# Patient Record
Sex: Female | Born: 1990 | Race: Black or African American | Hispanic: No | Marital: Single | Smoking: Former smoker
Health system: Southern US, Community
[De-identification: ages and names within clinical notes are randomized; demographics above are authoritative.]

## PROBLEM LIST (undated history)

## (undated) ENCOUNTER — Emergency Department (HOSPITAL_BASED_OUTPATIENT_CLINIC_OR_DEPARTMENT_OTHER): Discharge status: Absent without leave | Payer: Medicare Other

## (undated) DIAGNOSIS — Z7901 Long term (current) use of anticoagulants: Secondary | ICD-10-CM

## (undated) DIAGNOSIS — I82409 Acute embolism and thrombosis of unspecified deep veins of unspecified lower extremity: Secondary | ICD-10-CM

## (undated) DIAGNOSIS — F419 Anxiety disorder, unspecified: Secondary | ICD-10-CM

## (undated) DIAGNOSIS — D571 Sickle-cell disease without crisis: Secondary | ICD-10-CM

## (undated) DIAGNOSIS — D649 Anemia, unspecified: Secondary | ICD-10-CM

## (undated) DIAGNOSIS — F1110 Opioid abuse, uncomplicated: Secondary | ICD-10-CM

## (undated) DIAGNOSIS — I2699 Other pulmonary embolism without acute cor pulmonale: Secondary | ICD-10-CM

## (undated) DIAGNOSIS — I10 Essential (primary) hypertension: Secondary | ICD-10-CM

## (undated) DIAGNOSIS — S329XXA Fracture of unspecified parts of lumbosacral spine and pelvis, initial encounter for closed fracture: Secondary | ICD-10-CM

## (undated) DIAGNOSIS — L03211 Cellulitis of face: Secondary | ICD-10-CM

## (undated) DIAGNOSIS — Z95810 Presence of automatic (implantable) cardiac defibrillator: Secondary | ICD-10-CM

## (undated) DIAGNOSIS — F329 Major depressive disorder, single episode, unspecified: Secondary | ICD-10-CM

## (undated) DIAGNOSIS — N186 End stage renal disease: Secondary | ICD-10-CM

## (undated) DIAGNOSIS — K219 Gastro-esophageal reflux disease without esophagitis: Secondary | ICD-10-CM

## (undated) DIAGNOSIS — I499 Cardiac arrhythmia, unspecified: Secondary | ICD-10-CM

## (undated) DIAGNOSIS — M810 Age-related osteoporosis without current pathological fracture: Secondary | ICD-10-CM

## (undated) DIAGNOSIS — L0201 Cutaneous abscess of face: Secondary | ICD-10-CM

## (undated) DIAGNOSIS — I509 Heart failure, unspecified: Secondary | ICD-10-CM

## (undated) DIAGNOSIS — S22009A Unspecified fracture of unspecified thoracic vertebra, initial encounter for closed fracture: Secondary | ICD-10-CM

## (undated) DIAGNOSIS — F32A Depression, unspecified: Secondary | ICD-10-CM

## (undated) DIAGNOSIS — R011 Cardiac murmur, unspecified: Secondary | ICD-10-CM

## (undated) DIAGNOSIS — Z9289 Personal history of other medical treatment: Secondary | ICD-10-CM

## (undated) DIAGNOSIS — Z992 Dependence on renal dialysis: Secondary | ICD-10-CM

## (undated) DIAGNOSIS — G8929 Other chronic pain: Secondary | ICD-10-CM

## (undated) HISTORY — PX: AV FISTULA PLACEMENT: SHX1204

## (undated) HISTORY — PX: CARDIAC CATHETERIZATION: SHX172

## (undated) HISTORY — PX: NEPHRECTOMY: SHX65

## (undated) HISTORY — PX: TONSILLECTOMY AND ADENOIDECTOMY: SUR1326

---
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
## 2010-10-31 ENCOUNTER — Emergency Department (HOSPITAL_BASED_OUTPATIENT_CLINIC_OR_DEPARTMENT_OTHER)
Admission: EM | Admit: 2010-10-31 | Discharge: 2010-10-31 | Payer: Self-pay | Source: Home / Self Care | Admitting: Emergency Medicine

## 2010-11-07 LAB — DIFFERENTIAL
Basophils Absolute: 0 10*3/uL (ref 0.0–0.1)
Basophils Relative: 0 % (ref 0–1)
Eosinophils Absolute: 0.1 10*3/uL (ref 0.0–0.7)
Eosinophils Relative: 2 % (ref 0–5)
Lymphocytes Relative: 34 % (ref 12–46)
Lymphs Abs: 2.4 10*3/uL (ref 0.7–4.0)
Monocytes Absolute: 0.8 10*3/uL (ref 0.1–1.0)
Monocytes Relative: 11 % (ref 3–12)
Neutro Abs: 3.7 10*3/uL (ref 1.7–7.7)
Neutrophils Relative %: 52 % (ref 43–77)

## 2010-11-07 LAB — COMPREHENSIVE METABOLIC PANEL
ALT: 4 U/L (ref 0–35)
AST: 27 U/L (ref 0–37)
Albumin: 4.7 g/dL (ref 3.5–5.2)
Alkaline Phosphatase: 66 U/L (ref 39–117)
BUN: 22 mg/dL (ref 6–23)
CO2: 19 mEq/L (ref 19–32)
Calcium: 10.2 mg/dL (ref 8.4–10.5)
Chloride: 113 mEq/L — ABNORMAL HIGH (ref 96–112)
Creatinine, Ser: 1.7 mg/dL — ABNORMAL HIGH (ref 0.4–1.2)
GFR calc Af Amer: 47 mL/min — ABNORMAL LOW (ref >60)
GFR calc non Af Amer: 39 mL/min — ABNORMAL LOW (ref >60)
Glucose, Bld: 86 mg/dL (ref 70–99)
Potassium: 4.8 mEq/L (ref 3.5–5.1)
Sodium: 146 mEq/L — ABNORMAL HIGH (ref 135–145)
Total Bilirubin: 0.5 mg/dL (ref 0.3–1.2)
Total Protein: 8.9 g/dL — ABNORMAL HIGH (ref 6.0–8.3)

## 2010-11-07 LAB — BASIC METABOLIC PANEL
BUN: 22 mg/dL (ref 6–23)
CO2: 18 mEq/L — ABNORMAL LOW (ref 19–32)
Calcium: 9 mg/dL (ref 8.4–10.5)
Chloride: 117 mEq/L — ABNORMAL HIGH (ref 96–112)
Creatinine, Ser: 1.7 mg/dL — ABNORMAL HIGH (ref 0.4–1.2)
GFR calc Af Amer: 47 mL/min — ABNORMAL LOW (ref >60)
GFR calc non Af Amer: 39 mL/min — ABNORMAL LOW (ref >60)
Glucose, Bld: 90 mg/dL (ref 70–99)
Potassium: 4.6 mEq/L (ref 3.5–5.1)
Sodium: 143 mEq/L (ref 135–145)

## 2010-11-07 LAB — URINALYSIS, ROUTINE W REFLEX MICROSCOPIC
Bilirubin Urine: NEGATIVE
Ketones, ur: NEGATIVE mg/dL
Leukocytes, UA: NEGATIVE
Nitrite: NEGATIVE
Protein, ur: 100 mg/dL — AB
Specific Gravity, Urine: 1.025 (ref 1.005–1.030)
Urine Glucose, Fasting: NEGATIVE mg/dL
Urobilinogen, UA: 0.2 mg/dL (ref 0.0–1.0)
pH: 6.5 (ref 5.0–8.0)

## 2010-11-07 LAB — CBC
HCT: 35.4 % — ABNORMAL LOW (ref 36.0–46.0)
Hemoglobin: 11.4 g/dL — ABNORMAL LOW (ref 12.0–15.0)
MCH: 25.7 pg — ABNORMAL LOW (ref 26.0–34.0)
MCHC: 32.2 g/dL (ref 30.0–36.0)
MCV: 79.7 fL (ref 78.0–100.0)
Platelets: 239 10*3/uL (ref 150–400)
RBC: 4.44 MIL/uL (ref 3.87–5.11)
RDW: 13.9 % (ref 11.5–15.5)
WBC: 7 10*3/uL (ref 4.0–10.5)

## 2010-11-07 LAB — PREGNANCY, URINE: Preg Test, Ur: NEGATIVE

## 2010-11-07 LAB — URINE MICROSCOPIC-ADD ON

## 2011-01-08 LAB — BASIC METABOLIC PANEL
BUN: 27 mg/dL — ABNORMAL HIGH (ref 6–23)
CO2: 15 mEq/L — ABNORMAL LOW (ref 19–32)
Calcium: 10.3 mg/dL (ref 8.4–10.5)
Chloride: 112 mEq/L (ref 96–112)
Creatinine, Ser: 1.9 mg/dL — ABNORMAL HIGH (ref 0.4–1.2)
GFR calc Af Amer: 42 mL/min — ABNORMAL LOW (ref >60)
GFR calc non Af Amer: 34 mL/min — ABNORMAL LOW (ref >60)
Glucose, Bld: 85 mg/dL (ref 70–99)
Potassium: 4.8 mEq/L (ref 3.5–5.1)
Sodium: 145 mEq/L (ref 135–145)

## 2011-01-08 LAB — URINE CULTURE: Colony Count: 80000

## 2011-01-08 LAB — CBC
HCT: 35.6 % — ABNORMAL LOW (ref 36.0–46.0)
Hemoglobin: 11.5 g/dL — ABNORMAL LOW (ref 12.0–15.0)
MCHC: 32.3 g/dL (ref 30.0–36.0)
MCV: 82 fL (ref 78.0–100.0)
Platelets: 256 10*3/uL (ref 150–400)
RBC: 4.34 MIL/uL (ref 3.87–5.11)
RDW: 13.5 % (ref 11.5–15.5)
WBC: 11 10*3/uL — ABNORMAL HIGH (ref 4.0–10.5)

## 2011-01-08 LAB — DIFFERENTIAL
Basophils Absolute: 0.3 10*3/uL — ABNORMAL HIGH (ref 0.0–0.1)
Basophils Relative: 3 % — ABNORMAL HIGH (ref 0–1)
Eosinophils Absolute: 0.1 10*3/uL (ref 0.0–0.7)
Eosinophils Relative: 1 % (ref 0–5)
Lymphocytes Relative: 24 % (ref 12–46)
Lymphs Abs: 2.6 10*3/uL (ref 0.7–4.0)
Monocytes Absolute: 0.8 10*3/uL (ref 0.1–1.0)
Monocytes Relative: 7 % (ref 3–12)
Neutro Abs: 7.2 10*3/uL (ref 1.7–7.7)
Neutrophils Relative %: 65 % (ref 43–77)

## 2011-01-08 LAB — URINALYSIS, ROUTINE W REFLEX MICROSCOPIC
Bilirubin Urine: NEGATIVE
Glucose, UA: NEGATIVE mg/dL
Ketones, ur: NEGATIVE mg/dL
Nitrite: POSITIVE — AB
Protein, ur: 100 mg/dL — AB
Specific Gravity, Urine: 1.019 (ref 1.005–1.030)
Urobilinogen, UA: 0.2 mg/dL (ref 0.0–1.0)
pH: 6 (ref 5.0–8.0)

## 2011-01-08 LAB — URINE MICROSCOPIC-ADD ON

## 2011-01-08 LAB — PREGNANCY, URINE: Preg Test, Ur: NEGATIVE

## 2011-01-26 LAB — CBC
HCT: 34 % — ABNORMAL LOW (ref 36.0–46.0)
Hemoglobin: 11.1 g/dL — ABNORMAL LOW (ref 12.0–15.0)
MCHC: 32.6 g/dL (ref 30.0–36.0)
MCV: 84.3 fL (ref 78.0–100.0)
Platelets: 230 10*3/uL (ref 150–400)
RBC: 4.04 MIL/uL (ref 3.87–5.11)
RDW: 13.7 % (ref 11.5–15.5)
WBC: 15.8 10*3/uL — ABNORMAL HIGH (ref 4.0–10.5)

## 2011-01-26 LAB — COMPREHENSIVE METABOLIC PANEL
ALT: 11 U/L (ref 0–35)
AST: 22 U/L (ref 0–37)
Albumin: 4.6 g/dL (ref 3.5–5.2)
Alkaline Phosphatase: 65 U/L (ref 39–117)
BUN: 15 mg/dL (ref 6–23)
CO2: 23 mEq/L (ref 19–32)
Calcium: 10.2 mg/dL (ref 8.4–10.5)
Chloride: 106 mEq/L (ref 96–112)
Creatinine, Ser: 1.7 mg/dL — ABNORMAL HIGH (ref 0.4–1.2)
GFR calc Af Amer: 47 mL/min — ABNORMAL LOW (ref >60)
GFR calc non Af Amer: 39 mL/min — ABNORMAL LOW (ref >60)
Glucose, Bld: 97 mg/dL (ref 70–99)
Potassium: 4.3 mEq/L (ref 3.5–5.1)
Sodium: 141 mEq/L (ref 135–145)
Total Bilirubin: 0.8 mg/dL (ref 0.3–1.2)
Total Protein: 8.7 g/dL — ABNORMAL HIGH (ref 6.0–8.3)

## 2011-01-26 LAB — DIFFERENTIAL
Basophils Absolute: 0.1 10*3/uL (ref 0.0–0.1)
Basophils Relative: 0 % (ref 0–1)
Eosinophils Absolute: 0 10*3/uL (ref 0.0–0.7)
Eosinophils Relative: 0 % (ref 0–5)
Lymphocytes Relative: 8 % — ABNORMAL LOW (ref 12–46)
Lymphs Abs: 1.3 10*3/uL (ref 0.7–4.0)
Monocytes Absolute: 0.7 10*3/uL (ref 0.1–1.0)
Monocytes Relative: 5 % (ref 3–12)
Neutro Abs: 13.7 10*3/uL — ABNORMAL HIGH (ref 1.7–7.7)
Neutrophils Relative %: 87 % — ABNORMAL HIGH (ref 43–77)

## 2011-01-26 LAB — URINE MICROSCOPIC-ADD ON

## 2011-01-26 LAB — URINALYSIS, ROUTINE W REFLEX MICROSCOPIC
Bilirubin Urine: NEGATIVE
Glucose, UA: NEGATIVE mg/dL
Ketones, ur: NEGATIVE mg/dL
Nitrite: NEGATIVE
Protein, ur: 30 mg/dL — AB
Specific Gravity, Urine: 1.019 (ref 1.005–1.030)
Urobilinogen, UA: 0.2 mg/dL (ref 0.0–1.0)
pH: 6 (ref 5.0–8.0)

## 2011-01-26 LAB — URINE CULTURE: Colony Count: >=100000

## 2011-01-26 LAB — WET PREP, GENITAL: Yeast Wet Prep HPF POC: NONE SEEN

## 2011-01-26 LAB — PREGNANCY, URINE: Preg Test, Ur: NEGATIVE

## 2011-01-26 LAB — CULTURE, BLOOD (ROUTINE X 2)

## 2011-04-17 ENCOUNTER — Emergency Department (INDEPENDENT_AMBULATORY_CARE_PROVIDER_SITE_OTHER): Payer: Managed Care, Other (non HMO)

## 2011-04-17 ENCOUNTER — Emergency Department (HOSPITAL_BASED_OUTPATIENT_CLINIC_OR_DEPARTMENT_OTHER)
Admission: EM | Admit: 2011-04-17 | Discharge: 2011-04-17 | Disposition: A | Discharge status: Home / Self Care | Payer: Managed Care, Other (non HMO) | Attending: Emergency Medicine | Admitting: Emergency Medicine

## 2011-04-17 DIAGNOSIS — R109 Unspecified abdominal pain: Secondary | ICD-10-CM | POA: Insufficient documentation

## 2011-04-17 DIAGNOSIS — Z940 Kidney transplant status: Secondary | ICD-10-CM

## 2011-04-17 DIAGNOSIS — M545 Low back pain, unspecified: Secondary | ICD-10-CM | POA: Insufficient documentation

## 2011-04-17 DIAGNOSIS — R599 Enlarged lymph nodes, unspecified: Secondary | ICD-10-CM

## 2011-04-17 LAB — COMPREHENSIVE METABOLIC PANEL
AST: 17 U/L (ref 0–37)
Alkaline Phosphatase: 60 U/L (ref 39–117)
BUN: 22 mg/dL (ref 6–23)
CO2: 20 mEq/L (ref 19–32)
Chloride: 108 mEq/L (ref 96–112)
Creatinine, Ser: 1.5 mg/dL — ABNORMAL HIGH (ref 0.50–1.10)
GFR calc non Af Amer: 45 mL/min — ABNORMAL LOW (ref >60)
Total Bilirubin: 0.2 mg/dL — ABNORMAL LOW (ref 0.3–1.2)

## 2011-04-17 LAB — URINE MICROSCOPIC-ADD ON

## 2011-04-17 LAB — DIFFERENTIAL
Lymphocytes Relative: 30 % (ref 12–46)
Lymphs Abs: 3.5 10*3/uL (ref 0.7–4.0)
Neutrophils Relative %: 62 % (ref 43–77)

## 2011-04-17 LAB — CBC
HCT: 33.6 % — ABNORMAL LOW (ref 36.0–46.0)
MCV: 81.8 fL (ref 78.0–100.0)
Platelets: 273 10*3/uL (ref 150–400)
RBC: 4.11 MIL/uL (ref 3.87–5.11)
WBC: 11.4 10*3/uL — ABNORMAL HIGH (ref 4.0–10.5)

## 2011-04-17 LAB — URINALYSIS, ROUTINE W REFLEX MICROSCOPIC
Bilirubin Urine: NEGATIVE
Glucose, UA: NEGATIVE mg/dL
Ketones, ur: NEGATIVE mg/dL
pH: 6.5 (ref 5.0–8.0)

## 2011-09-28 ENCOUNTER — Emergency Department (HOSPITAL_COMMUNITY)
Admission: EM | Admit: 2011-09-28 | Discharge: 2011-09-28 | Discharge status: Against Medical Advice | Payer: Managed Care, Other (non HMO) | Attending: Emergency Medicine | Admitting: Emergency Medicine

## 2011-09-28 ENCOUNTER — Encounter: Payer: Self-pay | Admitting: Emergency Medicine

## 2011-09-28 DIAGNOSIS — R509 Fever, unspecified: Secondary | ICD-10-CM | POA: Insufficient documentation

## 2011-09-28 NOTE — ED Notes (Signed)
PT. REPORTS FEVER ONSET TODAY WHILE ON HEMODIALYSIS ,  ALSO REPORTS 'KNOT " AT RIGHT LATERAL NECK .

## 2012-01-09 ENCOUNTER — Other Ambulatory Visit (HOSPITAL_COMMUNITY): Payer: Self-pay | Admitting: Nephrology

## 2012-01-09 DIAGNOSIS — N186 End stage renal disease: Secondary | ICD-10-CM

## 2012-01-12 ENCOUNTER — Ambulatory Visit (HOSPITAL_COMMUNITY): Payer: Managed Care, Other (non HMO)

## 2012-01-22 DIAGNOSIS — I2699 Other pulmonary embolism without acute cor pulmonale: Secondary | ICD-10-CM

## 2012-01-22 HISTORY — DX: Other pulmonary embolism without acute cor pulmonale: I26.99

## 2012-01-31 ENCOUNTER — Other Ambulatory Visit (HOSPITAL_COMMUNITY): Payer: Self-pay | Admitting: Nephrology

## 2012-01-31 DIAGNOSIS — N186 End stage renal disease: Secondary | ICD-10-CM

## 2012-02-01 ENCOUNTER — Ambulatory Visit (HOSPITAL_COMMUNITY): Admission: RE | Admit: 2012-02-01 | Payer: Managed Care, Other (non HMO) | Source: Ambulatory Visit

## 2012-02-02 ENCOUNTER — Encounter (HOSPITAL_COMMUNITY): Payer: Self-pay | Admitting: *Deleted

## 2012-02-02 ENCOUNTER — Inpatient Hospital Stay (HOSPITAL_COMMUNITY)
Admission: EM | Admit: 2012-02-02 | Discharge: 2012-02-14 | DRG: 175 | Disposition: A | Discharge status: Home / Self Care | Payer: Managed Care, Other (non HMO) | Attending: Internal Medicine | Admitting: Internal Medicine

## 2012-02-02 ENCOUNTER — Ambulatory Visit (HOSPITAL_COMMUNITY)
Admission: RE | Admit: 2012-02-02 | Discharge: 2012-02-02 | Disposition: A | Discharge status: Home / Self Care | Payer: Managed Care, Other (non HMO) | Source: Ambulatory Visit | Attending: Nephrology | Admitting: Nephrology

## 2012-02-02 ENCOUNTER — Emergency Department (HOSPITAL_COMMUNITY): Payer: Managed Care, Other (non HMO)

## 2012-02-02 ENCOUNTER — Other Ambulatory Visit: Payer: Self-pay

## 2012-02-02 DIAGNOSIS — Z940 Kidney transplant status: Secondary | ICD-10-CM

## 2012-02-02 DIAGNOSIS — D696 Thrombocytopenia, unspecified: Secondary | ICD-10-CM | POA: Diagnosis present

## 2012-02-02 DIAGNOSIS — Y841 Kidney dialysis as the cause of abnormal reaction of the patient, or of later complication, without mention of misadventure at the time of the procedure: Secondary | ICD-10-CM | POA: Diagnosis present

## 2012-02-02 DIAGNOSIS — I10 Essential (primary) hypertension: Secondary | ICD-10-CM

## 2012-02-02 DIAGNOSIS — T82898A Other specified complication of vascular prosthetic devices, implants and grafts, initial encounter: Secondary | ICD-10-CM | POA: Diagnosis present

## 2012-02-02 DIAGNOSIS — R109 Unspecified abdominal pain: Secondary | ICD-10-CM

## 2012-02-02 DIAGNOSIS — K5900 Constipation, unspecified: Secondary | ICD-10-CM | POA: Diagnosis not present

## 2012-02-02 DIAGNOSIS — N186 End stage renal disease: Secondary | ICD-10-CM

## 2012-02-02 DIAGNOSIS — Z9119 Patient's noncompliance with other medical treatment and regimen: Secondary | ICD-10-CM

## 2012-02-02 DIAGNOSIS — E079 Disorder of thyroid, unspecified: Secondary | ICD-10-CM | POA: Diagnosis present

## 2012-02-02 DIAGNOSIS — Q613 Polycystic kidney, unspecified: Secondary | ICD-10-CM

## 2012-02-02 DIAGNOSIS — I428 Other cardiomyopathies: Secondary | ICD-10-CM | POA: Diagnosis present

## 2012-02-02 DIAGNOSIS — I120 Hypertensive chronic kidney disease with stage 5 chronic kidney disease or end stage renal disease: Secondary | ICD-10-CM | POA: Diagnosis present

## 2012-02-02 DIAGNOSIS — R197 Diarrhea, unspecified: Secondary | ICD-10-CM | POA: Diagnosis present

## 2012-02-02 DIAGNOSIS — N2581 Secondary hyperparathyroidism of renal origin: Secondary | ICD-10-CM | POA: Diagnosis present

## 2012-02-02 DIAGNOSIS — D649 Anemia, unspecified: Secondary | ICD-10-CM | POA: Diagnosis present

## 2012-02-02 DIAGNOSIS — Z905 Acquired absence of kidney: Secondary | ICD-10-CM

## 2012-02-02 DIAGNOSIS — Z79899 Other long term (current) drug therapy: Secondary | ICD-10-CM

## 2012-02-02 DIAGNOSIS — Z87891 Personal history of nicotine dependence: Secondary | ICD-10-CM

## 2012-02-02 DIAGNOSIS — Z7901 Long term (current) use of anticoagulants: Secondary | ICD-10-CM

## 2012-02-02 DIAGNOSIS — Z888 Allergy status to other drugs, medicaments and biological substances status: Secondary | ICD-10-CM

## 2012-02-02 DIAGNOSIS — Z992 Dependence on renal dialysis: Secondary | ICD-10-CM | POA: Diagnosis present

## 2012-02-02 DIAGNOSIS — I2699 Other pulmonary embolism without acute cor pulmonale: Principal | ICD-10-CM | POA: Diagnosis present

## 2012-02-02 DIAGNOSIS — J189 Pneumonia, unspecified organism: Secondary | ICD-10-CM | POA: Diagnosis present

## 2012-02-02 DIAGNOSIS — Z91199 Patient's noncompliance with other medical treatment and regimen due to unspecified reason: Secondary | ICD-10-CM

## 2012-02-02 DIAGNOSIS — R042 Hemoptysis: Secondary | ICD-10-CM | POA: Diagnosis present

## 2012-02-02 HISTORY — DX: Essential (primary) hypertension: I10

## 2012-02-02 LAB — CBC
MCHC: 29.6 g/dL — ABNORMAL LOW (ref 30.0–36.0)
MCV: 92.7 fL (ref 78.0–100.0)
Platelets: 204 10*3/uL (ref 150–400)
RDW: 21 % — ABNORMAL HIGH (ref 11.5–15.5)
WBC: 7.4 10*3/uL (ref 4.0–10.5)

## 2012-02-02 LAB — DIFFERENTIAL
Basophils Absolute: 0 10*3/uL (ref 0.0–0.1)
Basophils Relative: 0 % (ref 0–1)
Eosinophils Absolute: 0 10*3/uL (ref 0.0–0.7)
Eosinophils Relative: 0 % (ref 0–5)
Lymphocytes Relative: 31 % (ref 12–46)

## 2012-02-02 LAB — BASIC METABOLIC PANEL
Calcium: 10.5 mg/dL (ref 8.4–10.5)
Creatinine, Ser: 6.65 mg/dL — ABNORMAL HIGH (ref 0.50–1.10)
GFR calc Af Amer: 9 mL/min — ABNORMAL LOW (ref >90)
GFR calc non Af Amer: 8 mL/min — ABNORMAL LOW (ref >90)
Sodium: 139 mEq/L (ref 135–145)

## 2012-02-02 MED ORDER — XENON XE 133 GAS
20.0000 | GAS_FOR_INHALATION | Freq: Once | RESPIRATORY_TRACT | Status: AC | PRN
  Administered 2012-02-02: 20 via RESPIRATORY_TRACT

## 2012-02-02 MED ORDER — LABETALOL HCL 5 MG/ML IV SOLN
20.0000 mg | Freq: Once | INTRAVENOUS | Status: AC
  Administered 2012-02-02: 20 mg via INTRAVENOUS
  Filled 2012-02-02: qty 4

## 2012-02-02 MED ORDER — ALTEPLASE 100 MG IV SOLR
2.0000 mg | Freq: Once | INTRAVENOUS | Status: DC
  Filled 2012-02-02: qty 2

## 2012-02-02 MED ORDER — FENTANYL CITRATE 0.05 MG/ML IJ SOLN
100.0000 ug | Freq: Once | INTRAMUSCULAR | Status: AC
  Administered 2012-02-03: 100 ug via INTRAVENOUS
  Filled 2012-02-02: qty 2

## 2012-02-02 MED ORDER — HYDROMORPHONE HCL PF 1 MG/ML IJ SOLN
1.0000 mg | Freq: Once | INTRAMUSCULAR | Status: AC
  Administered 2012-02-02: 1 mg via INTRAVENOUS
  Filled 2012-02-02: qty 1

## 2012-02-02 MED ORDER — DIPHENHYDRAMINE HCL 50 MG/ML IJ SOLN
25.0000 mg | Freq: Once | INTRAMUSCULAR | Status: AC
  Administered 2012-02-02: 25 mg via INTRAVENOUS
  Filled 2012-02-02: qty 1

## 2012-02-02 MED ORDER — ONDANSETRON HCL 4 MG/2ML IJ SOLN
4.0000 mg | Freq: Once | INTRAMUSCULAR | Status: AC
  Administered 2012-02-02: 4 mg via INTRAVENOUS
  Filled 2012-02-02: qty 2

## 2012-02-02 MED ORDER — TECHNETIUM TO 99M ALBUMIN AGGREGATED
3.0000 | Freq: Once | INTRAVENOUS | Status: AC | PRN
  Administered 2012-02-02: 3 via INTRAVENOUS

## 2012-02-02 NOTE — ED Notes (Signed)
Was seen and examined by Dr. Effie Shy

## 2012-02-02 NOTE — ED Notes (Signed)
Alert, NAD, calm, interactive, skin W&D, resps e/u, speaking in clear compelte sentences, "sleepy",  mentions itching remains (from pain med), requesting benadryl. Updated, pending dispositon and/or POC. Unable to do CTA at this time, EDP, pt & CT aware.

## 2012-02-02 NOTE — ED Notes (Signed)
VQ tech called from home, will be ready in ~ 1hr.

## 2012-02-02 NOTE — Progress Notes (Signed)
Patient ID: [name] Small, female   DOB: [birth year], 20 y.o.   MRN: [id number] The patient was evaluated at the Interventional Radiology nursing station for possible declot of a right upper arm fistula.  Ultrasound demonstrated complete occlusion of the right cephalic vein from the elbow to the right shoulder.  According to the renal PA,  the fistula has never worked well.  The fistula is not a candidate for a declot procedure due to the length of the occlusion and poor function of the fistula when it was patent.

## 2012-02-02 NOTE — ED Notes (Signed)
IV team finished at [facility], unable to obtain a suitable IV for CTA, no success with US guided attempt. EDP aware.

## 2012-02-02 NOTE — ED Provider Notes (Signed)
History     CSN: [id number]  Arrival date & time 02/02/12  1312   First MD Initiated Contact with Patient 02/02/12 1604      Chief Complaint  Patient presents with  . Chest Pain    (Consider location/radiation/quality/duration/timing/severity/associated sxs/prior treatment) HPI Comments: [name] Small is a 21 y.o. Female who has had hemoptysis since early morning. Today. She is not producing sputum. She had nausea and vomiting. Last night, but has not vomited today. She has not eaten today. She has no chest pain, weakness, dizziness, or back pain. She last took dialysis yesterday. She takes dialysis regularly. She has never had bronchitis, asthma or COPD. She took her usual. Blood pressure medicine today until arriving here.  Patient is a 21 y.o. female presenting with chest pain. The history is provided by the patient.  Chest Pain     Past Medical History  Diagnosis Date  . Renal failure   . Hemodialysis patient   . PCOS (polycystic ovarian syndrome)     Past Surgical History  Procedure Date  . Nephrectomy   . Av fistula placement     History reviewed. No pertinent family history.  History  Substance Use Topics  . Smoking status: Never Smoker   . Smokeless tobacco: Not on file  . Alcohol Use: No    OB History    Grav Para Term Preterm Abortions TAB SAB Ect Mult Living                  Review of Systems  Cardiovascular: Positive for chest pain.  All other systems reviewed and are negative.    Allergies  Morphine and related  Home Medications   Current Outpatient Rx  Name Route Sig Dispense Refill  . CALCIUM ACETATE 667 MG PO CAPS Oral Take 667 mg by mouth 3 (three) times daily with meals.      . ENALAPRIL MALEATE 10 MG PO TABS Oral Take 10 mg by mouth daily.    Marland Kitchen HYDRALAZINE HCL 25 MG PO TABS Oral Take 25 mg by mouth 2 (two) times daily with a meal.    . ISOSORBIDE DINITRATE 20 MG PO TABS Oral Take 20 mg by mouth 3 (three) times daily.    .  OXYCODONE-ACETAMINOPHEN 5-325 MG PO TABS Oral Take 1-2 tablets by mouth every 4 (four) hours as needed. For pain     . PROMETHAZINE HCL 25 MG PO TABS Oral Take 25 mg by mouth every 6 (six) hours as needed.      BP 141/116  Pulse 85  Temp(Src) 98.3 F (36.8 C) (Oral)  Resp 16  Ht 5\' 3"  (1.6 m)  Wt 168 lb 6.9 oz (76.4 kg)  BMI 29.84 kg/m2  SpO2 100%  Physical Exam  Nursing note and vitals reviewed. Constitutional: She is oriented to person, place, and time. She appears well-developed and well-nourished.  HENT:  Head: Normocephalic and atraumatic.  Eyes: Conjunctivae and EOM are normal. Pupils are equal, round, and reactive to light.  Neck: Normal range of motion and phonation normal. Neck supple.  Cardiovascular: Normal rate, regular rhythm and intact distal pulses.   Pulmonary/Chest: Effort normal and breath sounds normal. No respiratory distress. She has no wheezes. She has no rales. She exhibits no tenderness.       Dialysis catheter right anterior chest wall, site appears normal.  Abdominal: Soft. She exhibits no distension. There is no tenderness. There is no guarding.  Musculoskeletal: Normal range of motion.  Neurological: She is alert  and oriented to person, place, and time. She has normal strength. She exhibits normal muscle tone.  Skin: Skin is warm and dry.  Psychiatric: She has a normal mood and affect. Her behavior is normal. Judgment and thought content normal.    ED Course  Procedures (including critical care time)  Attempts to cannulate a large extremity vein were unsuccessful. Contacted the radiologist on call, who was able to call the nuclear medicine team in, and to do a VQ scan  23:45- call from the radiologist, who has diagnosed left-sided PE in at least 2 segments. Heparin, ordered per pharmacy consult. We'll arrange admission.  Additional emergency treatment, labetalol for hypertension. Blood pressure improved to 141/116 at 0003 hours. Oxygen saturation  remained normal at 100% with nasal cannula supplementation 2 L per minute  CRITICAL CARE Performed by: Mancel Bale L   Total critical care time: 45  Critical care time was exclusive of separately billable procedures and treating other patients.  Critical care was necessary to treat or prevent imminent or life-threatening deterioration.  Critical care was time spent personally by me on the following activities: development of treatment plan with patient and/or surrogate as well as nursing, discussions with consultants, evaluation of patient's response to treatment, examination of patient, obtaining history from patient or surrogate, ordering and performing treatments and interventions, ordering and review of laboratory studies, ordering and review of radiographic studies, pulse oximetry and re-evaluation of patient's condition.   Labs Reviewed  CBC - Abnormal; Notable for the following:    MCHC 29.6 (*)    RDW 21.0 (*)    All other components within normal limits  BASIC METABOLIC PANEL - Abnormal; Notable for the following:    Creatinine, Ser 6.65 (*)    GFR calc non Af Amer 8 (*)    GFR calc Af Amer 9 (*)    All other components within normal limits  DIFFERENTIAL  PROTIME-INR  APTT  HEPARIN LEVEL (UNFRACTIONATED)  CBC  CARDIAC PANEL(CRET KIN+CKTOT+MB+TROPI)   Nm Pulmonary Per & Vent  02/02/2012  *RADIOLOGY REPORT*  Clinical Data: Shortness of breath, cough, hemoptysis  NM PULMONARY VENTILATION AND PERFUSION SCAN  Radiopharmaceutical: CURIE xenon xe 133 gas 20 milli Curie XENON XE 133 GAS, CURIE MAA TECHNETIUM TO 56M ALBUMIN AGGREGATED  Comparison: Correlation with chest radiograph dated 02/02/2012  Findings: Ventilation images demonstrate decreased ventilation to the left lower lobe.  Perfusion images demonstrate a segmental filling defect to the anterior portion of the left lower lobe.  Additional possible subsegmental filling defect to the anterior right lower  lobe.  Chest radiograph demonstrates mild cardiomegaly but no corresponding pulmonary opacities.  This appearance corresponds to a high probability for pulmonary embolism.  IMPRESSION: High probability for pulmonary embolism.  Critical Value/emergent results were called by telephone at the time of interpretation on 02/02/2012  at 2345 hours  to  Dr. Effie Shy, who verbally acknowledged these results.  Original Report Authenticated By: Charline Bills, M.D.   Dg Chest Port 1 View  02/02/2012  *RADIOLOGY REPORT*  Clinical Data: Coughing up blood with abdominal pain  PORTABLE CHEST - 1 VIEW  Comparison: None.  Findings: There is a dialysis catheter from right IJ approach. Cardiac silhouette is enlarged.  There is mild central venous congestion.  No effusion, infiltrate, or pneumothorax.  IMPRESSION: Cardiomegaly  with mild central venous congestion.  Original Report Authenticated By: Genevive Bi, M.D.    Date: 02/03/2012  Rate: 104  Rhythm: sinus tachycardia  QRS Axis: normal  Intervals: normal  ST/T Wave abnormalities: nonspecific ST/T changes  Conduction Disutrbances:none  Narrative Interpretation: RA enlargement  Old EKG Reviewed: none available   1. Pulmonary embolus   2. End stage renal disease   3. Hypertension       MDM  Multisegmental PE. Patient requires admission for stabilization. Blood pressure improved with treatment in the ED. Marland Kitchen No complicating factors to her with end-stage renal disease.   Plan: Admit- Admit as unassigned pt     Flint Melter, MD 02/03/12 [phone]

## 2012-02-02 NOTE — ED Notes (Addendum)
Dr. Effie Shy made aware about patient's elevated BP, see new order. ERMD was also made aware that the patient is c/o itching, denies any SOB,

## 2012-02-02 NOTE — ED Notes (Signed)
Reports onset this am of left side chest pains, last dialysis tx was yesterday. Having bilateral side pains today also. No acute distress noted at triage, ekg done.

## 2012-02-02 NOTE — ED Notes (Signed)
To VQ

## 2012-02-02 NOTE — H&P (Signed)
[name] Small is an 21 y.o. female.   Chief Complaint: clotted right arm dialysis fistula HPI: Patient with ESRD and clotted right arm hemodialysis fistula presents today for thrombolysis, possible angioplasty/stenting of fistula .  Past Medical History  Diagnosis Date  . Renal failure   . Hemodialysis patient   . PCOS (polycystic ovarian syndrome)     Past Surgical History  Procedure Date  . Nephrectomy   . Av fistula placement     No family history on file. Social History:  reports that she has never smoked. She does not have any smokeless tobacco history on file. She reports that she does not drink alcohol or use illicit drugs.  Allergies:  Allergies  Allergen Reactions  . Morphine And Related Itching    Medications Prior to Admission  Medication Sig Dispense Refill  . calcium acetate (PHOSLO) 667 MG capsule Take 667 mg by mouth 3 (three) times daily with meals.        . enalapril (VASOTEC) 10 MG tablet Take 10 mg by mouth daily.      . hydrALAZINE (APRESOLINE) 25 MG tablet Take 25 mg by mouth 2 (two) times daily with a meal.      . isosorbide dinitrate (ISORDIL) 20 MG tablet Take 20 mg by mouth 3 (three) times daily.      Marland Kitchen oxyCODONE-acetaminophen (PERCOCET) 5-325 MG per tablet Take 1-2 tablets by mouth every 4 (four) hours as needed. For pain       . promethazine (PHENERGAN) 25 MG tablet Take 25 mg by mouth every 6 (six) hours as needed.       Medications Prior to Admission  Medication Dose Route Frequency Provider Last Rate Last Dose  . alteplase (ACTIVASE) injection 2 mg  2 mg Intracatheter Once Abundio Miu, MD        No results found for this or any previous visit (from the past 48 hour(s)). No results found.  Review of Systems  Constitutional: Negative for fever and chills.  Respiratory: Positive for cough. Negative for shortness of breath.   Cardiovascular: Negative for chest pain.  Gastrointestinal: Negative for nausea, vomiting and abdominal pain.    Neurological: Negative for headaches.  Endo/Heme/Allergies: Does not bruise/bleed easily.    Blood pressure 141/117, pulse 106, temperature 97.8 F (36.6 C), temperature source Oral, resp. rate 13, SpO2 100.00%. Physical Exam  Constitutional: She is oriented to person, place, and time. She appears well-developed and well-nourished.  Cardiovascular: Regular rhythm.        Tachycardic; has right IJ perm cath; right arm fistula with no thrill/bruit  Respiratory: Effort normal and breath sounds normal.  GI: Soft. Bowel sounds are normal.  Musculoskeletal: Normal range of motion. She exhibits no edema.  Neurological: She is alert and oriented to person, place, and time.     Assessment/Plan Patient with ESRD and clotted right arm hemodialysis fistula; plan is for thrombolysis, possible angioplasty/stenting of fistula. Details/risks of above d/w pt with her understanding and consent.  Monice Lundy,D KEVIN 02/02/2012, 12:08 PM

## 2012-02-02 NOTE — ED Notes (Signed)
Pt not in room, pt in VQ

## 2012-02-03 ENCOUNTER — Inpatient Hospital Stay (HOSPITAL_COMMUNITY): Payer: Managed Care, Other (non HMO)

## 2012-02-03 ENCOUNTER — Encounter (HOSPITAL_COMMUNITY): Payer: Self-pay | Admitting: Internal Medicine

## 2012-02-03 DIAGNOSIS — Z992 Dependence on renal dialysis: Secondary | ICD-10-CM | POA: Diagnosis present

## 2012-02-03 DIAGNOSIS — N186 End stage renal disease: Secondary | ICD-10-CM | POA: Diagnosis present

## 2012-02-03 DIAGNOSIS — R109 Unspecified abdominal pain: Secondary | ICD-10-CM

## 2012-02-03 DIAGNOSIS — I2699 Other pulmonary embolism without acute cor pulmonale: Secondary | ICD-10-CM

## 2012-02-03 LAB — COMPREHENSIVE METABOLIC PANEL
ALT: 11 U/L (ref 0–35)
Calcium: 10.3 mg/dL (ref 8.4–10.5)
Creatinine, Ser: 7.9 mg/dL — ABNORMAL HIGH (ref 0.50–1.10)
GFR calc Af Amer: 8 mL/min — ABNORMAL LOW (ref >90)
Glucose, Bld: 101 mg/dL — ABNORMAL HIGH (ref 70–99)
Sodium: 138 mEq/L (ref 135–145)
Total Protein: 6.9 g/dL (ref 6.0–8.3)

## 2012-02-03 LAB — APTT: aPTT: 33 seconds (ref 24–37)

## 2012-02-03 LAB — MAGNESIUM: Magnesium: 2.1 mg/dL (ref 1.5–2.5)

## 2012-02-03 LAB — CBC
Hemoglobin: 11.5 g/dL — ABNORMAL LOW (ref 12.0–15.0)
MCH: 28 pg (ref 26.0–34.0)
MCHC: 31 g/dL (ref 30.0–36.0)

## 2012-02-03 LAB — CARDIAC PANEL(CRET KIN+CKTOT+MB+TROPI): Total CK: 52 U/L (ref 7–177)

## 2012-02-03 LAB — PROTIME-INR
INR: 1.21 (ref 0.00–1.49)
Prothrombin Time: 15.6 seconds — ABNORMAL HIGH (ref 11.6–15.2)

## 2012-02-03 LAB — MRSA PCR SCREENING: MRSA by PCR: NEGATIVE

## 2012-02-03 LAB — ANTITHROMBIN III: AntiThromb III Func: 66 % — ABNORMAL LOW (ref 75–120)

## 2012-02-03 MED ORDER — ONDANSETRON HCL 4 MG/2ML IJ SOLN
4.0000 mg | Freq: Four times a day (QID) | INTRAMUSCULAR | Status: DC | PRN
  Administered 2012-02-03 – 2012-02-12 (×4): 4 mg via INTRAVENOUS
  Filled 2012-02-03 (×4): qty 2

## 2012-02-03 MED ORDER — OXYCODONE-ACETAMINOPHEN 5-325 MG PO TABS
1.0000 | ORAL_TABLET | ORAL | Status: DC | PRN
  Administered 2012-02-03: 2 via ORAL
  Filled 2012-02-03 (×2): qty 2

## 2012-02-03 MED ORDER — ACETAMINOPHEN 325 MG PO TABS: 650.0000 mg | ORAL_TABLET | Freq: Four times a day (QID) | ORAL | Status: DC | PRN

## 2012-02-03 MED ORDER — SODIUM CHLORIDE 0.9 % IV SOLN
62.5000 mg | INTRAVENOUS | Status: DC
  Administered 2012-02-08: 62.5 mg via INTRAVENOUS
  Filled 2012-02-03: qty 5

## 2012-02-03 MED ORDER — WARFARIN VIDEO: Freq: Once | Status: DC

## 2012-02-03 MED ORDER — HYDRALAZINE HCL 25 MG PO TABS
25.0000 mg | ORAL_TABLET | Freq: Two times a day (BID) | ORAL | Status: DC
  Administered 2012-02-04 (×2): 25 mg via ORAL
  Filled 2012-02-03 (×5): qty 1

## 2012-02-03 MED ORDER — DARBEPOETIN ALFA-POLYSORBATE 100 MCG/0.5ML IJ SOLN: 100.0000 ug | INTRAMUSCULAR | Status: DC

## 2012-02-03 MED ORDER — ENALAPRIL MALEATE 10 MG PO TABS
10.0000 mg | ORAL_TABLET | Freq: Every day | ORAL | Status: DC
  Administered 2012-02-04 – 2012-02-08 (×5): 10 mg via ORAL
  Filled 2012-02-03 (×7): qty 1

## 2012-02-03 MED ORDER — SODIUM CHLORIDE 0.9 % IJ SOLN
3.0000 mL | Freq: Two times a day (BID) | INTRAMUSCULAR | Status: DC
  Administered 2012-02-05 – 2012-02-13 (×10): 3 mL via INTRAVENOUS

## 2012-02-03 MED ORDER — HYDROMORPHONE HCL PF 1 MG/ML IJ SOLN
1.0000 mg | INTRAMUSCULAR | Status: DC | PRN
  Administered 2012-02-03 – 2012-02-09 (×24): 1 mg via INTRAVENOUS
  Filled 2012-02-03 (×23): qty 1

## 2012-02-03 MED ORDER — WARFARIN SODIUM 10 MG PO TABS
10.0000 mg | ORAL_TABLET | Freq: Once | ORAL | Status: DC
  Filled 2012-02-03: qty 1

## 2012-02-03 MED ORDER — HYDROMORPHONE HCL PF 2 MG/ML IJ SOLN: 1.0000 mg | INTRAMUSCULAR | Status: DC | PRN

## 2012-02-03 MED ORDER — ISOSORBIDE DINITRATE 20 MG PO TABS
20.0000 mg | ORAL_TABLET | Freq: Three times a day (TID) | ORAL | Status: DC
  Administered 2012-02-03 – 2012-02-14 (×30): 20 mg via ORAL
  Filled 2012-02-03 (×36): qty 1

## 2012-02-03 MED ORDER — SODIUM CHLORIDE 0.9 % IV SOLN
250.0000 mL | INTRAVENOUS | Status: DC | PRN
  Administered 2012-02-08 – 2012-02-13 (×2): 250 mL via INTRAVENOUS

## 2012-02-03 MED ORDER — RENA-VITE PO TABS
1.0000 | ORAL_TABLET | Freq: Every day | ORAL | Status: DC
  Administered 2012-02-04 – 2012-02-09 (×6): 1 via ORAL
  Filled 2012-02-03 (×8): qty 1

## 2012-02-03 MED ORDER — HEPARIN (PORCINE) IN NACL 100-0.45 UNIT/ML-% IJ SOLN
1100.0000 [IU]/h | INTRAMUSCULAR | Status: DC
  Administered 2012-02-03 – 2012-02-04 (×3): 1100 [IU]/h via INTRAVENOUS
  Filled 2012-02-03 (×4): qty 250

## 2012-02-03 MED ORDER — SODIUM CHLORIDE 0.9 % IJ SOLN: 3.0000 mL | INTRAMUSCULAR | Status: DC | PRN

## 2012-02-03 MED ORDER — ALUM & MAG HYDROXIDE-SIMETH 200-200-20 MG/5ML PO SUSP
30.0000 mL | Freq: Four times a day (QID) | ORAL | Status: DC | PRN
  Filled 2012-02-03: qty 30

## 2012-02-03 MED ORDER — GUAIFENESIN-DM 100-10 MG/5ML PO SYRP
5.0000 mL | ORAL_SOLUTION | ORAL | Status: DC | PRN
  Filled 2012-02-03: qty 5

## 2012-02-03 MED ORDER — IOHEXOL 350 MG/ML SOLN
65.0000 mL | Freq: Once | INTRAVENOUS | Status: AC | PRN
  Administered 2012-02-03: 65 mL via INTRAVENOUS

## 2012-02-03 MED ORDER — ACETAMINOPHEN 650 MG RE SUPP: 650.0000 mg | Freq: Four times a day (QID) | RECTAL | Status: DC | PRN

## 2012-02-03 MED ORDER — HEPARIN BOLUS VIA INFUSION
4000.0000 [IU] | Freq: Once | INTRAVENOUS | Status: AC
  Administered 2012-02-03: 4000 [IU] via INTRAVENOUS

## 2012-02-03 MED ORDER — DIPHENHYDRAMINE HCL 50 MG/ML IJ SOLN
INTRAMUSCULAR | Status: AC
  Filled 2012-02-03: qty 1

## 2012-02-03 MED ORDER — WARFARIN - PHARMACIST DOSING INPATIENT
Freq: Every day | Status: DC
  Administered 2012-02-10 – 2012-02-13 (×4)

## 2012-02-03 MED ORDER — SODIUM CHLORIDE 0.9 % IJ SOLN: 3.0000 mL | Freq: Two times a day (BID) | INTRAMUSCULAR | Status: DC

## 2012-02-03 MED ORDER — DOCUSATE SODIUM 100 MG PO CAPS
100.0000 mg | ORAL_CAPSULE | Freq: Two times a day (BID) | ORAL | Status: DC
  Administered 2012-02-03 – 2012-02-14 (×17): 100 mg via ORAL
  Filled 2012-02-03 (×25): qty 1

## 2012-02-03 MED ORDER — ONDANSETRON HCL 4 MG PO TABS: 4.0000 mg | ORAL_TABLET | Freq: Four times a day (QID) | ORAL | Status: DC | PRN

## 2012-02-03 MED ORDER — ALBUTEROL SULFATE (5 MG/ML) 0.5% IN NEBU: 2.5000 mg | INHALATION_SOLUTION | RESPIRATORY_TRACT | Status: DC | PRN

## 2012-02-03 MED ORDER — COUMADIN BOOK
Freq: Once | Status: AC
  Administered 2012-02-03: 09:00:00
  Filled 2012-02-03: qty 1

## 2012-02-03 MED ORDER — CALCIUM ACETATE 667 MG PO CAPS
1334.0000 mg | ORAL_CAPSULE | Freq: Three times a day (TID) | ORAL | Status: DC
  Administered 2012-02-03 – 2012-02-04 (×2): 1334 mg via ORAL
  Filled 2012-02-03 (×5): qty 2

## 2012-02-03 MED ORDER — DIPHENHYDRAMINE HCL 50 MG/ML IJ SOLN
25.0000 mg | Freq: Once | INTRAMUSCULAR | Status: AC
  Administered 2012-02-03: 25 mg via INTRAVENOUS

## 2012-02-03 MED ORDER — CALCIUM ACETATE 667 MG PO CAPS
667.0000 mg | ORAL_CAPSULE | Freq: Three times a day (TID) | ORAL | Status: DC
  Administered 2012-02-03 (×2): 667 mg via ORAL
  Filled 2012-02-03 (×4): qty 1

## 2012-02-03 NOTE — ED Notes (Signed)
Wt and Ht given to pharmacy ref Heparin gtt.  Pt requesting pain med.

## 2012-02-03 NOTE — Progress Notes (Addendum)
ANTICOAGULATION CONSULT NOTE - Follow Up Consult  Pharmacy Consult for hepain/coumadin bridge day 1 Indication: probable PE  Allergies  Allergen Reactions  . Morphine And Related Itching    Patient Measurements: Height: 5\' 3"  (160 cm) Weight: 160 lb 7.9 oz (72.8 kg) IBW/kg (Calculated) : 52.4  Heparin Dosing Weight:   Vital Signs: Temp: 97.5 F (36.4 C) (04/13 0915) Temp src: Oral (04/13 0915) BP: 130/96 mmHg (04/13 0915) Pulse Rate: 98  (04/13 0915)  Labs:  Basename 02/03/12 0820 02/03/12 0600 02/03/12 0039 02/03/12 0016 02/02/12 1736  HGB 11.5* -- -- -- 12.0  HCT 37.1 -- -- -- 40.5  PLT 167 -- -- -- 204  APTT -- -- -- 33 --  LABPROT -- -- -- 15.6* --  INR -- -- -- 1.21 --  HEPARINUNFRC 0.42 -- -- -- --  CREATININE -- 7.90* -- -- 6.65*  CKTOTAL -- -- 52 -- --  CKMB -- -- 1.3 -- --  TROPONINI -- -- <0.30 -- --   Estimated Creatinine Clearance: 10.9 ml/min (by C-G formula based on Cr of 7.9).  20 yof with ESRD 2/2 polycystic renal dz admitted 02/02/2012 for CP - found with bilat PE. Presented initially for thrombolysis of clotted HD fistula though US showed complete occlusion R cephalic vein from elbow to shoulder >>not deemed appropriate for declot procedure.  Pharmacist System-Based Medication Review: Anticoag: PE on D1 heparin/coumadin bridge. HL at goal, CBC ok, INR ordered, coumadin 10mg  x1 ordered - MD dc'ed for hemoptysis. F/u CT angio to confirm PE and rule out pulm hemorrhage. Noted low antithrombin III functional assay (66%) but hard to interpret as this lab taken after heparin started in setting of acute thrombosis. ID: none. Wbc wnl. CV: resumed ace-i, hydral, imdur. vss. Endo: BMET BG 80-100 GI: DSS BID Neuro: a&o Nephr: resumed phoslo, lytes wnl, except corr Ca elevated and Ca*P >55. Pulm: RA Heme/Onc: anemic, cbc ok otherwise Best Prax: hep/coumadin Tx doses for PE  Goal of Therapy:  INR 2-3 Heparin level 0.3-0.7 units/ml   PLAN: 1. Continue  current rate of heparin 1100 units/hr. Spoke with nurse to closely monitor for worsening hemoptysis and call MD if worsens. 2. F/u AM heparin level and CBC 3. F/u CT angio 4. Will DC daily INR and reorder tmrw if MD ok to start coumadin. Tonight's entered dose held. 5. Would consider repeat antithrombin functional assay after cessation of heparin - value from this AM was drawn after heparin in setting of acute thrombosis.  Zoe Lan, PharmD pgr [phone] 02/03/2012, 11:32 AM

## 2012-02-03 NOTE — Progress Notes (Addendum)
ANTICOAGULATION CONSULT NOTE - Initial Consult  Pharmacy Consult for heparin Indication: pulmonary embolus  Allergies  Allergen Reactions  . Morphine And Related Itching    Patient Measurements: Height: 5\' 3"  (160 cm) Weight: 168 lb 6.9 oz (76.4 kg) IBW/kg (Calculated) : 52.4   Vital Signs: Temp: 98.3 F (36.8 C) (04/12 1503) Temp src: Oral (04/12 1503) BP: 141/116 mmHg (04/13 0003) Pulse Rate: 85  (04/12 2345)  Labs:  Basename 02/02/12 1736  HGB 12.0  HCT 40.5  PLT 204  APTT --  LABPROT --  INR --  HEPARINUNFRC --  CREATININE 6.65*  CKTOTAL --  CKMB --  TROPONINI --   Estimated Creatinine Clearance: 13.2 ml/min (by C-G formula based on Cr of 6.65).  Medical History: Past Medical History  Diagnosis Date  . Renal failure   . Hemodialysis patient   . PCOS (polycystic ovarian syndrome)     Assessment: 21yo female with ESRD presented initially for thrombolysis of clotted HD fistula though US showed complete occulusion of right cephalic vein from elbow to shoulder and therefore not appropriate for declot procedure; c/o CP and now found with high probability of PE on VQ, to begin heparin.  Goal of Therapy:  Heparin level 0.3-0.7 units/ml   Plan:  Will give heparin 4000 units IV bolus x1 followed by gtt at 1100 units/hr and monitor heparin levels and CBC and f/u plans for long-term anticoagulation.  Colleen Can PharmD BCPS 02/03/2012,12:19 AM   Addendum: Coumadin ordered to start today for PE; will give 10mg  po x1 and adjust per INR and begin Coumadin education.  VB 02/03/2012 3:33 AM

## 2012-02-03 NOTE — Progress Notes (Signed)
Pt arrived to the floor via stretcher accompanied by ED staff. Transferred to the bed and admission assessment and history completed. Bed lowered to lowest position, wheels locked, and bed alarm turned on. Pt has no complaints of  shortness of breath. Will continue to assess pt periodically.

## 2012-02-03 NOTE — Progress Notes (Signed)
Subjective: Patient still complaining of chest pain, still coughing blood, moderate amount.  She also relates history of abdominal pain left side on and off for last 3 months. She has prior history of abscess in the site were she had nephrectomy (Right side), She was on antibiotics but it was stop by her dr because she developed diarrhea.  She is still having diarrhea, for last 3 weeks, watery.   Objective: Filed Vitals:   02/03/12 0230 02/03/12 0323 02/03/12 0526 02/03/12 0915  BP: 147/115 133/102 125/90 130/96  Pulse:  104 97 98  Temp:  97.4 F (36.3 C) 97.4 F (36.3 C) 97.5 F (36.4 C)  TempSrc:  Oral Oral Oral  Resp: 27 22 22 20   Height:  5\' 3"  (1.6 m)    Weight:  72.8 kg (160 lb 7.9 oz)    SpO2:  92% 98% 96%   Weight change:   Intake/Output Summary (Last 24 hours) at 02/03/12 1020 Last data filed at 02/03/12 0900  Gross per 24 hour  Intake 903.78 ml  Output      0 ml  Net 903.78 ml    General: Alert, awake, oriented x3, in no acute distress.  HEENT: No bruits, no goiter.  Heart: Regular rate and rhythm, without murmurs, rubs, gallops.  Lungs: Crackles bases, bilateral air movement.  Abdomen: Soft, nontender, nondistended, positive bowel sounds.  Neuro: Grossly intact, nonfocal.   Lab Results:  Basename 02/03/12 0600 02/02/12 1736  NA 138 139  K 4.0 4.0  CL 96 96  CO2 22 20  GLUCOSE 101* 87  BUN 23 18  CREATININE 7.90* 6.65*  CALCIUM 10.3 10.5  MG 2.1 --  PHOS 6.9* --    Basename 02/03/12 0600  AST 19  ALT 11  ALKPHOS 51  BILITOT 1.0  PROT 6.9  ALBUMIN 3.3*    Basename 02/03/12 0820 02/02/12 1736  WBC 7.5 7.4  NEUTROABS -- 4.5  HGB 11.5* 12.0  HCT 37.1 40.5  MCV 90.3 92.7  PLT 167 204    Basename 02/03/12 0039  CKTOTAL 52  CKMB 1.3  CKMBINDEX --  TROPONINI <0.30    Micro Results: Recent Results (from the past 240 hour(s))  MRSA PCR SCREENING     Status: Normal   Collection Time   02/03/12  3:57 AM      Component Value Range Status  Comment   MRSA by PCR NEGATIVE  NEGATIVE  Final     Studies/Results: Nm Pulmonary Per & Vent  02/02/2012  *RADIOLOGY REPORT*  Clinical Data: Shortness of breath, cough, hemoptysis  NM PULMONARY VENTILATION AND PERFUSION SCAN  Radiopharmaceutical: CURIE xenon xe 133 gas 20 milli Curie XENON XE 133 GAS, CURIE MAA TECHNETIUM TO 65M ALBUMIN AGGREGATED  Comparison: Correlation with chest radiograph dated 02/02/2012  Findings: Ventilation images demonstrate decreased ventilation to the left lower lobe.  Perfusion images demonstrate a segmental filling defect to the anterior portion of the left lower lobe.  Additional possible subsegmental filling defect to the anterior right lower lobe.  Chest radiograph demonstrates mild cardiomegaly but no corresponding pulmonary opacities.  This appearance corresponds to a high probability for pulmonary embolism.  IMPRESSION: High probability for pulmonary embolism.  Critical Value/emergent results were called by telephone at the time of interpretation on 02/02/2012  at 2345 hours  to  Dr. Effie Shy, who verbally acknowledged these results.  Original Report Authenticated By: Charline Bills, M.D.   Dg Chest Port 1 View  02/02/2012  *RADIOLOGY REPORT*  Clinical Data:  Coughing up blood with abdominal pain  PORTABLE CHEST - 1 VIEW  Comparison: None.  Findings: There is a dialysis catheter from right IJ approach. Cardiac silhouette is enlarged.  There is mild central venous congestion.  No effusion, infiltrate, or pneumothorax.  IMPRESSION: Cardiomegaly  with mild central venous congestion.  Original Report Authenticated By: Genevive Bi, M.D.    Medications: I have reviewed the patient's current medications.   Patient Active Hospital Problem List:  Pulmonary Embolism:  Patient still coughing blood, V-Q scan high probability for PE. Because patient is coughing blood I will get CT angio to confirm PE and rule out pulmonary hemorrhage. I will hold on given  Coumadin in case hemoptysis get worse, and await for CT result.   ESRD (end stage renal disease) (02/03/2012) Renal consulted.   HTN; Hydralazine and lisinopril.   Diarrhea;  Still having diarrhea. She has not been taking any medications for diarrhea. She was on antibiotics. I will check C diff.   Left side Abdominal pain: Could be secondary to colitis, but she has history of abdominal abscess. I will check CT abdomen and pelvis. No leukocytosis or fever hold on antibiotics.      LOS: 1 day   Cinde Ebert M.D.  [facility] 02/03/2012, 10:20 AM

## 2012-02-03 NOTE — H&P (Signed)
PCP:  [facility]   Chief Complaint:  Cough and chest pain  HPI: [name] Small is a 21 y.o. female   has a past medical history of Renal failure; Hemodialysis patient; Hypertension; and Polycystic kidney disease.   Presented with  Chest pain for the past 24 hours and hemoptysis since this Am. A spoonful of bloody mucus every time she coughs. Chest pain with cough and pain with deep inspiration. She had a 3 hour beach trip 3 weeks ago. No leg swelling no pain. No family history of blood clots. She gets HD every Tuesday Thursday and Saturday.   Review of Systems:    Pertinent positives include: chest pain, shortness of breath at rest. productive cough, coughing up of blood.  Constitutional:  No weight loss, night sweats, Fevers, chills, fatigue, weight loss  HEENT:  No headaches, Difficulty swallowing,Tooth/dental problems,Sore throat,  No sneezing, itching, ear ache, nasal congestion, post nasal drip,  Cardio-vascular:  No , Orthopnea, PND, anasarca, dizziness, palpitations.no Bilateral lower extremity swelling  GI:  No heartburn, indigestion, abdominal pain, nausea, vomiting, diarrhea, change in bowel habits, loss of appetite, melena, blood in stool, hematoemesis Resp:  no  No dyspnea on exertion, No excess mucus, no  No non-productive cough, No No change in color of mucus.No wheezing. Skin:  no rash or lesions. No jaundice GU:  no dysuria, change in color of urine, no urgency or frequency. No straining to urinate.  No flank pain.  Musculoskeletal:  No joint pain or no joint swelling. No decreased range of motion. No back pain.  Psych:  No change in mood or affect. No depression or anxiety. No memory loss.  Neuro: no localizing neurological complaints, no tingling, no weakness, no double vision, no gait abnormality, no slurred speech, no confusion  Otherwise ROS are negative except for above, 10 systems were reviewed  Past Medical History: Past Medical  History  Diagnosis Date  . Renal failure   . Hemodialysis patient   . Hypertension   . Polycystic kidney disease    Past Surgical History  Procedure Date  . Nephrectomy   . Av fistula placement   . Kidney transplant 2008    failed     Medications: Prior to Admission medications   Medication Sig Start Date End Date Taking? Authorizing Provider  calcium acetate (PHOSLO) 667 MG capsule Take 667 mg by mouth 3 (three) times daily with meals.     Yes Historical Provider, MD  enalapril (VASOTEC) 10 MG tablet Take 10 mg by mouth daily.   Yes Historical Provider, MD  hydrALAZINE (APRESOLINE) 25 MG tablet Take 25 mg by mouth 2 (two) times daily with a meal.   Yes Historical Provider, MD  isosorbide dinitrate (ISORDIL) 20 MG tablet Take 20 mg by mouth 3 (three) times daily.   Yes Historical Provider, MD  oxyCODONE-acetaminophen (PERCOCET) 5-325 MG per tablet Take 1-2 tablets by mouth every 4 (four) hours as needed. For pain    Yes Historical Provider, MD  promethazine (PHENERGAN) 25 MG tablet Take 25 mg by mouth every 6 (six) hours as needed.   Yes Historical Provider, MD    Allergies:   Allergies  Allergen Reactions  . Morphine And Related Itching    Social History:  Ambulatory  independently  Lives at  home   reports that she has quit smoking. She does not have any smokeless tobacco history on file. She reports that she does not drink alcohol or use illicit drugs.  Family History: family history includes Polycystic kidney disease in her father.    Physical Exam: Patient Vitals for the past 24 hrs:  BP Temp Temp src Pulse Resp SpO2 Height Weight  02/03/12 0230 147/115 mmHg - - - 27  - - -  02/03/12 0215 146/114 mmHg - - 93  29  100 % - -  02/03/12 0200 147/118 mmHg - - - 24  - - -  02/03/12 0155 134/99 mmHg - - - 20  100 % - -  02/03/12 0145 134/99 mmHg - - 86  23  100 % - -  02/03/12 0130 143/111 mmHg - - 90  24  100 % - -  02/03/12 0115 148/115 mmHg - - 91  30  100 % -  -  02/03/12 0100 142/113 mmHg - - 95  18  100 % - -  02/03/12 0045 137/111 mmHg - - 92  22  100 % - -  02/03/12 0039 142/112 mmHg 97.7 F (36.5 C) Oral - 18  100 % - -  02/03/12 0030 - - - 92  19  100 % - -  02/03/12 0024 - - - - - - 5\' 3"  (1.6 m) 76.4 kg (168 lb 6.9 oz)  02/03/12 0015 141/117 mmHg - - 93  30  100 % - -  02/03/12 0003 141/116 mmHg - - - 16  100 % 5\' 3"  (1.6 m) 76.4 kg (168 lb 6.9 oz)  02/02/12 2345 141/116 mmHg - - 85  25  100 % - -  02/02/12 2342 144/116 mmHg - - - 25  - - -  02/02/12 2230 144/122 mmHg - - 98  18  100 % - -  02/02/12 2221 - - - 132  - 98 % - -  02/02/12 2220 - - - 93  - 100 % - -  02/02/12 2219 - - - 92  - 100 % - -  02/02/12 2218 - - - 92  - 100 % - -  02/02/12 2217 - - - 92  - 100 % - -  02/02/12 2216 - - - 119  - 100 % - -  02/02/12 2215 - - - 94  - 100 % - -  02/02/12 2214 - - - 66  - 100 % - -  02/02/12 2213 - - - 93  - 100 % - -  02/02/12 2212 - - - 99  - 100 % - -  02/02/12 2211 - - - 101  - 100 % - -  02/02/12 2210 - - - 48  - 98 % - -  02/02/12 2209 - - - 58  - 100 % - -  02/02/12 2208 - - - 122  - 100 % - -  02/02/12 2207 - - - 82  - 100 % - -  02/02/12 2206 - - - 105  17  100 % - -  02/02/12 1945 136/115 mmHg - - 95  25  100 % - -  02/02/12 1830 176/136 mmHg - - 112  26  - - -  02/02/12 1503 154/115 mmHg 98.3 F (36.8 C) Oral 70  15  99 % - -  02/02/12 1319 152/126 mmHg 97.4 F (36.3 C) Oral 105  13  99 % - -    1. General:  in No Acute distress 2. Psychological: Alert and  Oriented 3. Head/ENT:   Moist  Dry Mucous Membranes  Head Non traumatic, neck supple                          Normal  Dentition 4. SKIN: normal  Skin turgor,  Skin clean Dry and intact no rash 5. Heart: Regular rate and rhythm no Murmur rapid, Rub or gallop 6. Lungs: Clear to auscultation bilaterally, no wheezes or crackles   7. Abdomen: Soft, non-tender, Non distended 8. Lower extremities: no clubbing, cyanosis, or edema 9.  Neurologically Grossly intact, moving all 4 extremities equally 10. MSK: Normal range of motion  body mass index is 29.84 kg/(m^2).   Labs on Admission:   [facility] 02/02/12 1736  NA 139  K 4.0  CL 96  CO2 20  GLUCOSE 87  BUN 18  CREATININE 6.65*  CALCIUM 10.5  MG --  PHOS --   No results found for this basename: AST:2,ALT:2,ALKPHOS:2,BILITOT:2,PROT:2,ALBUMIN:2 in the last 72 hours No results found for this basename: LIPASE:2,AMYLASE:2 in the last 72 hours  Basename 02/02/12 1736  WBC 7.4  NEUTROABS 4.5  HGB 12.0  HCT 40.5  MCV 92.7  PLT 204   No results found for this basename: CKTOTAL:3,CKMB:3,CKMBINDEX:3,TROPONINI:3 in the last 72 hours No results found for this basename: TSH,T4TOTAL,FREET3,T3FREE,THYROIDAB in the last 72 hours No results found for this basename: VITAMINB12:2,FOLATE:2,FERRITIN:2,TIBC:2,IRON:2,RETICCTPCT:2 in the last 72 hours No results found for this basename: HGBA1C    Estimated Creatinine Clearance: 13.2 ml/min (by C-G formula based on Cr of 6.65). ABG No results found for this basename: phart, pco2, po2, hco3, tco2, acidbasedef, o2sat     No results found for this basename: DDIMER     Other results:  I have pearsonaly reviewed this: ECG REPORT  Rate: 104  Rhythm: NSR ST&T Change: S waves present   Cultures:    Component Value Date/Time   SDES URINE, CLEAN CATCH 10/24/2009 1145   SPECREQUEST NONE 10/24/2009 1145   CULT  Value: KLEBSIELLA PNEUMONIAE ESCHERICHIA COLI 10/24/2009 1145   REPTSTATUS 10/26/2009 FINAL 10/24/2009 1145       Radiological Exams on Admission: Nm Pulmonary Per & Vent  02/02/2012  *RADIOLOGY REPORT*  Clinical Data: Shortness of breath, cough, hemoptysis  NM PULMONARY VENTILATION AND PERFUSION SCAN  Radiopharmaceutical: CURIE xenon xe 133 gas 20 milli Curie XENON XE 133 GAS, CURIE MAA TECHNETIUM TO 46M ALBUMIN AGGREGATED  Comparison: Correlation with chest radiograph dated 02/02/2012  Findings:  Ventilation images demonstrate decreased ventilation to the left lower lobe.  Perfusion images demonstrate a segmental filling defect to the anterior portion of the left lower lobe.  Additional possible subsegmental filling defect to the anterior right lower lobe.  Chest radiograph demonstrates mild cardiomegaly but no corresponding pulmonary opacities.  This appearance corresponds to a high probability for pulmonary embolism.  IMPRESSION: High probability for pulmonary embolism.  Critical Value/emergent results were called by telephone at the time of interpretation on 02/02/2012  at 2345 hours  to  Dr. Effie Shy, who verbally acknowledged these results.  Original Report Authenticated By: Charline Bills, M.D.   Dg Chest Port 1 View  02/02/2012  *RADIOLOGY REPORT*  Clinical Data: Coughing up blood with abdominal pain  PORTABLE CHEST - 1 VIEW  Comparison: None.  Findings: There is a dialysis catheter from right IJ approach. Cardiac silhouette is enlarged.  There is mild central venous congestion.  No effusion, infiltrate, or pneumothorax.  IMPRESSION: Cardiomegaly  with mild central venous congestion.  Original Report Authenticated By: Genevive Bi, M.D.  Assessment/Plan  21 yo female with a history of polycystic renal disease on hemodialysis Tuesdays Thursdays Saturday here today for bilateral pulmonary embolism  Present on Admission:  .ESRD (end stage renal disease) - patient will need to be dialyzed oral will leave a message for renal  .HTN (hypertension) - continue home dose of medications watch for hypotension Pulmonary embolism - will monitor on telemetry obtain echo gram in order to evaluate for right-sided strain, started on heparin drip and Coumadin.   Prophylaxis: heparin drip   I have spent a total of  50 min on this admition  [name] Small 02/03/2012, 12:50 AM

## 2012-02-03 NOTE — ED Notes (Signed)
Back from VQ, +PE, pending orders, NAD, calm, resps e/u, speaking in complete sentences, lab at [facility].

## 2012-02-03 NOTE — Consult Note (Signed)
[facility] Renal Consultation Note  Indication for Consultation:  Management of ESRD/hemodialysis; anemia, hypertension/volume and secondary hyperparathyroidism  HPI: [name] Small is a 21 y.o. female admitted last night with Pulmonary Embolism. Gives history of mild sob and coughing up bloody mucus yesterday with some chest pain. She reports attending hd Thursday and coming for evaluation of clotted Right Upper Arm AVF yesterday. She was using a right IJ perm Cath. According to IR Dr. Lowella Dandy" Ultrasound demonstrated complete occlusion of the right cephalic vein from the elbow to the right shoulder andfistula is not a candidate for a declot procedure due to the length of the occlusion."  Her AVF  Was placed 09/21/11 and right  09/08/11 in [facility]).           Of note Pt. 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 She had a transplant nephrectomy 09/15/2011 in [place] by a Dr. Katherina Right. She has a history of medical noncompliance.  Now coughing  In room and not in distress.      Past Medical History  Diagnosis Date  . Renal failure   . Hemodialysis patient   . Hypertension   . Polycystic kidney disease     Past Surgical History  Procedure Date  . Nephrectomy   . Av fistula placement   . Kidney transplant 2008    failed      Family History  Problem Relation Age of Onset  . Polycystic kidney disease Father      Social History= Lives with "friend", no children ,quit smoking oct. 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   Allergies  Allergen Reactions  . Morphine And Related Itching    Prior to  Admission medications   Medication Sig Start Date End Date Taking? Authorizing Provider  calcium acetate (PHOSLO) 667 MG capsule Take 667 mg by mouth 3 (three) times daily with meals.     Yes Historical Provider, MD  enalapril (VASOTEC) 10 MG tablet Take 10 mg by mouth daily.   Yes Historical Provider, MD  hydrALAZINE (APRESOLINE) 25 MG tablet Take 25 mg by mouth 2 (two) times daily with a meal.   Yes Historical Provider, MD  isosorbide dinitrate (ISORDIL) 20 MG tablet Take 20 mg by mouth 3 (three) times daily.   Yes Historical Provider, MD  oxyCODONE-acetaminophen (PERCOCET) 5-325 MG per tablet Take 1-2 tablets by mouth every 4 (four) hours as needed. For pain    Yes Historical Provider, MD  promethazine (PHENERGAN) 25 MG tablet Take 25 mg by mouth every 6 (six) hours as needed.   Yes Historical Provider, MD    ZOX:WRUEAV chloride, acetaminophen, acetaminophen, albuterol, alum & mag hydroxide-simeth, guaiFENesin-dextromethorphan, HYDROmorphone, ondansetron (ZOFRAN) IV, ondansetron, oxyCODONE-acetaminophen, sodium chloride, technetium albumin aggregated, xenon xe 133, DISCONTD: HYDROmorphone  Results for orders placed during the hospital encounter of 02/02/12 (from the past 48 hour(s))  CBC     Status: Abnormal   Collection Time   02/02/12  5:36 PM      Component Value Range Comment   WBC 7.4  4.0 - 10.5 (K/uL)    RBC 4.37  3.87 - 5.11 (MIL/uL)    Hemoglobin 12.0  12.0 - 15.0 (g/dL)    HCT 47.8  29.5 - 62.1 (%)    MCV 92.7  78.0 - 100.0 (fL)    MCH 27.5  26.0 - 34.0 (pg)    MCHC 29.6 (*) 30.0 - 36.0 (g/dL)    RDW 30.8 (*) 65.7 - 15.5 (%)    Platelets 204  150 - 400 (K/uL)   DIFFERENTIAL     Status: Normal   Collection Time   02/02/12  5:36 PM      Component Value Range Comment   Neutrophils Relative 62  43 - 77 (%)    Neutro Abs 4.5  1.7 - 7.7 (K/uL)    Lymphocytes Relative 31  12 - 46 (%)    Lymphs Abs 2.3  0.7 - 4.0 (K/uL)    Monocytes Relative 7  3 - 12 (%)    Monocytes Absolute  0.5  0.1 - 1.0 (K/uL)    Eosinophils Relative 0  0 - 5 (%)    Eosinophils Absolute 0.0  0.0 - 0.7 (K/uL)    Basophils Relative 0  0 - 1 (%)    Basophils Absolute 0.0  0.0 - 0.1 (K/uL)   BASIC METABOLIC PANEL     Status: Abnormal   Collection Time   02/02/12  5:36 PM      Component Value Range Comment   Sodium 139  135 - 145 (mEq/L)    Potassium 4.0  3.5 - 5.1 (mEq/L)    Chloride 96  96 - 112 (mEq/L)    CO2 20  19 - 32 (mEq/L)    Glucose, Bld 87  70 - 99 (mg/dL)    BUN 18  6 - 23 (mg/dL)    Creatinine, Ser 8.46 (*) 0.50 - 1.10 (mg/dL)    Calcium 96.2  8.4 - 10.5 (mg/dL)    GFR calc non Af Amer 8 (*) >90 (mL/min)    GFR calc Af Amer 9 (*) >90 (mL/min)   PROTIME-INR     Status: Abnormal   Collection Time   02/03/12 12:16 AM      Component Value Range Comment   Prothrombin Time 15.6 (*) 11.6 - 15.2 (seconds)    INR 1.21  0.00 - 1.49    APTT     Status: Normal   Collection Time   02/03/12 12:16 AM      Component Value Range Comment   aPTT 33  24 - 37 (seconds)   CARDIAC PANEL(CRET KIN+CKTOT+MB+TROPI)     Status: Normal   Collection Time   02/03/12 12:39 AM      Component Value Range Comment   Total CK 52  7 - 177 (U/L)    CK, MB 1.3  0.3 - 4.0 (ng/mL)    Troponin I <0.30  <0.30 (ng/mL)    Relative Index RELATIVE INDEX IS INVALID  0.0 - 2.5    MRSA PCR SCREENING     Status: Normal   Collection Time   02/03/12  3:57 AM      Component Value Range Comment   MRSA by PCR NEGATIVE  NEGATIVE    MAGNESIUM     Status: Normal   Collection Time   02/03/12  6:00 AM      Component Value Range Comment   Magnesium 2.1  1.5 - 2.5 (mg/dL)   PHOSPHORUS     Status: Abnormal   Collection Time   02/03/12  6:00 AM      Component Value Range Comment   Phosphorus 6.9 (*)  2.3 - 4.6 (mg/dL)   COMPREHENSIVE METABOLIC PANEL     Status: Abnormal   Collection Time   02/03/12  6:00 AM      Component Value Range Comment   Sodium 138  135 - 145 (mEq/L)    Potassium 4.0  3.5 - 5.1 (mEq/L)    Chloride 96   96 - 112 (mEq/L)    CO2 22  19 - 32 (mEq/L)    Glucose, Bld 101 (*) 70 - 99 (mg/dL)    BUN 23  6 - 23 (mg/dL)    Creatinine, Ser 1.91 (*) 0.50 - 1.10 (mg/dL)    Calcium 47.8  8.4 - 10.5 (mg/dL)    Total Protein 6.9  6.0 - 8.3 (g/dL)    Albumin 3.3 (*) 3.5 - 5.2 (g/dL)    AST 19  0 - 37 (U/L)    ALT 11  0 - 35 (U/L)    Alkaline Phosphatase 51  39 - 117 (U/L)    Total Bilirubin 1.0  0.3 - 1.2 (mg/dL)    GFR calc non Af Amer 7 (*) >90 (mL/min)    GFR calc Af Amer 8 (*) >90 (mL/min)   ANTITHROMBIN III     Status: Abnormal   Collection Time   02/03/12  6:00 AM      Component Value Range Comment   AntiThromb III Func 66 (*) 75 - 120 (%)   HEPARIN LEVEL (UNFRACTIONATED)     Status: Normal   Collection Time   02/03/12  8:20 AM      Component Value Range Comment   Heparin Unfractionated 0.42  0.30 - 0.70 (IU/mL)   CBC     Status: Abnormal   Collection Time   02/03/12  8:20 AM      Component Value Range Comment   WBC 7.5  4.0 - 10.5 (K/uL)    RBC 4.11  3.87 - 5.11 (MIL/uL)    Hemoglobin 11.5 (*) 12.0 - 15.0 (g/dL)    HCT 29.5  62.1 - 30.8 (%)    MCV 90.3  78.0 - 100.0 (fL)    MCH 28.0  26.0 - 34.0 (pg)    MCHC 31.0  30.0 - 36.0 (g/dL)    RDW 65.7 (*) 84.6 - 15.5 (%)    Platelets 167  150 - 400 (K/uL)    EKG: normal EKG, normal sinus rhythm, unchanged from previous tracings, LBBB.  ROS: See above HPI  And  Reports prior Diarrhea from some antibiotics given her  By ID MD at [facility] followed for "Abcess at transplant site". Denies melena or hematuria.  Physical Exam: Filed Vitals:   02/03/12 0915  BP: 130/96  Pulse: 98  Temp: 97.5 F (36.4 C)  Resp: 20     General: Alert, young BF NAD, Appropriate HEENT: [place], MMM Eyes:  nonicteric Neck: supple, no jvd Heart: RRR, no rub or murmur Lungs: faint basilar rales Abdomen: bs+=, soft, slightly tender epigastric and left quad, Right nephrectomy Scar healed. Extremities: trace bipedal edema Skin: warm and dry Neuro:  0x3, no acute deficits noted Dialysis Access: right ij perm cath , right upper arm avf no bruit or thrill  Dialysis Orders: Center: [facility]   on 4hrs . EDW 77.0 and tapering down  HD Bath 2.0k, 2.25 ca  Time 4 Heparin standard. Access right ij perm  BFR 400 DFR 800    Zemplar 0 mcg IV/HD Epogen 20000   Units IV/HD  Venofer  50mg  weekly  Other 0  Assessment/Plan: 1. Pulmonary Embolism=  Per admit team , willl use no heparin on hd 2. ESRD -  Normal schedule [facility]AT adm, [facility] = hd today, noted some vascular congestion on cxr and bipedal edema, needing lower edw and edw was being lowered by Dr. Briant Cedar at op kid. Center.  3. HD Access= Have VVS eval while in Hospital. With noncompliance history. 4. Hypertension/volume  - as above vol. Off with hd and bp meds ; listed at kid. Center= Isordil 20mg  tid,Coreg 12.5 bid, Hydralizine 50mg  tid , may be ale to taper some down or off with hd uf  In hosp. 5. Anemia  - hgb 11.5, aranesp and venofer on hd 6. Metabolic bone disease -  Phoslo 2 with meals in hosp., no Zemplar PTH 127.5 7. Nutrition - supplement as needed 8. Reported Diarrhea = admit chk  C. Diff. 9. Left abd. Pain= admit  eval with CT pending 10. Ho Noncompliance= need for VVS to see for perm. Access eval while in hosp.  Lenny Pastel, PA-C [facility] [phone] 02/03/2012, 12:56 PM   Patient seen and examined and agree with assessment and plan as above.  Vinson Moselle  MD [facility] [phone] pgr    [phone] cell 02/03/2012,  1:15 pm

## 2012-02-03 NOTE — Progress Notes (Signed)
Patient needs larger IV for CT scan. Attempt not made due to limited options for this patient. IV team notified. Lurline Idol [facility]

## 2012-02-03 NOTE — ED Notes (Signed)
Pt rates pain a # 9 on pain scale 0/10.  Pain med given.

## 2012-02-03 NOTE — ED Notes (Signed)
Report called to floor, pt "feels better, pain is gone", NAD, calm, interactive, resps e/u, speaking in clear complete sentences, intermittant cough remains, no blood noted,  preparing to transport.

## 2012-02-03 NOTE — ED Notes (Signed)
Alert, NAD, calm, interactive, intermitant cough noted, no obvious blood at this time, admitting MD at [facility], pending orders and bed assignment.

## 2012-02-04 LAB — HEPARIN LEVEL (UNFRACTIONATED): Heparin Unfractionated: 0.44 IU/mL (ref 0.30–0.70)

## 2012-02-04 LAB — CBC
HCT: 40.5 % (ref 36.0–46.0)
MCHC: 30.9 g/dL (ref 30.0–36.0)
MCV: 92.7 fL (ref 78.0–100.0)
Platelets: 120 10*3/uL — ABNORMAL LOW (ref 150–400)
RDW: 20.6 % — ABNORMAL HIGH (ref 11.5–15.5)
WBC: 7 10*3/uL (ref 4.0–10.5)

## 2012-02-04 LAB — T4, FREE: Free T4: 2.1 ng/dL — ABNORMAL HIGH (ref 0.80–1.80)

## 2012-02-04 LAB — CLOSTRIDIUM DIFFICILE BY PCR: Toxigenic C. Difficile by PCR: NEGATIVE

## 2012-02-04 MED ORDER — DIPHENHYDRAMINE HCL 50 MG/ML IJ SOLN
12.5000 mg | Freq: Four times a day (QID) | INTRAMUSCULAR | Status: DC | PRN
  Administered 2012-02-04 – 2012-02-14 (×26): 12.5 mg via INTRAVENOUS
  Filled 2012-02-04 (×29): qty 1

## 2012-02-04 MED ORDER — DARBEPOETIN ALFA-POLYSORBATE 40 MCG/0.4ML IJ SOLN
40.0000 ug | INTRAMUSCULAR | Status: DC
  Filled 2012-02-04 (×2): qty 0.4

## 2012-02-04 MED ORDER — LANTHANUM CARBONATE 500 MG PO CHEW
1000.0000 mg | CHEWABLE_TABLET | Freq: Three times a day (TID) | ORAL | Status: DC
  Administered 2012-02-04 – 2012-02-05 (×5): 1000 mg via ORAL
  Filled 2012-02-04 (×9): qty 2

## 2012-02-04 MED ORDER — WARFARIN SODIUM 7.5 MG PO TABS
7.5000 mg | ORAL_TABLET | Freq: Once | ORAL | Status: AC
  Administered 2012-02-04: 7.5 mg via ORAL
  Filled 2012-02-04: qty 1

## 2012-02-04 MED ORDER — HYDRALAZINE HCL 50 MG PO TABS
50.0000 mg | ORAL_TABLET | Freq: Two times a day (BID) | ORAL | Status: DC
  Administered 2012-02-04 – 2012-02-07 (×6): 50 mg via ORAL
  Filled 2012-02-04 (×10): qty 1

## 2012-02-04 NOTE — Progress Notes (Signed)
ANTICOAGULATION CONSULT NOTE - Follow Up Consult  Pharmacy Consult for hepain/coumadin bridge day 1 (Coumadin dose cancelled yesterday) Indication: PE  Allergies  Allergen Reactions  . Morphine And Related Itching    Patient Measurements: Height: 5\' 3"  (160 cm) Weight: 156 lb 8.4 oz (71 kg) IBW/kg (Calculated) : 52.4  Heparin Dosing Weight:   Vital Signs: Temp: 97.7 F (36.5 C) (04/14 0509) Temp src: Oral (04/14 0509) BP: 135/104 mmHg (04/14 0509) Pulse Rate: 98  (04/14 0509)  Labs:  Basename 02/04/12 0625 02/03/12 0820 02/03/12 0600 02/03/12 0039 02/03/12 0016 02/02/12 1736  HGB 12.5 11.5* -- -- -- --  HCT 40.5 37.1 -- -- -- 40.5  PLT 120* 167 -- -- -- 204  APTT -- -- -- -- 33 --  LABPROT -- -- -- -- 15.6* --  INR -- -- -- -- 1.21 --  HEPARINUNFRC 0.44 0.42 -- -- -- --  CREATININE -- -- 7.90* -- -- 6.65*  CKTOTAL -- -- -- 52 -- --  CKMB -- -- -- 1.3 -- --  TROPONINI -- -- -- <0.30 -- --   Estimated Creatinine Clearance: 10.7 ml/min (by C-G formula based on Cr of 7.9).  20 yof with ESRD 2/2 polycystic renal dz admitted 02/02/2012 for CP - found with bilat PE. Presented initially for thrombolysis of clotted HD fistula though US showed complete occlusion R cephalic vein from elbow to shoulder >>not deemed appropriate for declot procedure.  Anticoag: PE, HL at goal this AM. MD ok to start coumadin as CT angio confirmed no pulm hemorrhage and hemoptysis improved per pt. H/H stable, plt drop 120 from 200 - MD made aware, ok to continue heparin. Coumadin score 9, but will start lower d/t hemoptysis. Noted low antithrombin III functional assay (66%) but hard to interpret as this lab taken after heparin started in setting of acute thrombosis.  Goal of Therapy:  INR 2-3 Heparin level 0.3-0.7 units/ml   PLAN: 1. Continue current rate of heparin 1100 units/hr. Spoke with nurse and patient to closely monitor for worsening hemoptysis and call MD if worsens. 2. Coumadin 7.5mg  PO x1  tonight, Daily INR 3. Will f/u AM heparin level, CBC, and INR. 4. Would consider repeat antithrombin functional assay after cessation of heparin - lab drawn after heparin in setting of acute thrombosis. 5. Patient educated on coumadin.  Zoe Lan, PharmD pgr [phone] 02/04/2012, 8:45 AM

## 2012-02-04 NOTE — Progress Notes (Signed)
Subjective:  No further coughing, eating breakfast, tolerated HD yesterday Objective Vital signs in last 24 hours: Filed Vitals:   02/03/12 2200 02/03/12 2230 02/03/12 2254 02/04/12 0509  BP:   160/129 135/104  Pulse: 87 85 89 98  Temp:   96.9 F (36.1 C) 97.7 F (36.5 C)  TempSrc:   Oral Oral  Resp:   22 20  Height:      Weight:   71 kg (156 lb 8.4 oz)   SpO2:   95% 100%   Weight change: -5.4 kg (-11 lb 14.5 oz)  Intake/Output Summary (Last 24 hours) at 02/04/12 0901 Last data filed at 02/04/12 0336  Gross per 24 hour  Intake    808 ml  Output   3526 ml  Net  -2718 ml   Labs: Basic Metabolic Panel:  Lab 02/03/12 9811 02/02/12 1736  NA 138 139  K 4.0 4.0  CL 96 96  CO2 22 20  GLUCOSE 101* 87  BUN 23 18  CREATININE 7.90* 6.65*  CALCIUM 10.3 10.5  ALB -- --  PHOS 6.9* --   Liver Function Tests:  Lab 02/03/12 0600  AST 19  ALT 11  ALKPHOS 51  BILITOT 1.0  PROT 6.9  ALBUMIN 3.3*   No results found for this basename: LIPASE:3,AMYLASE:3 in the last 168 hours No results found for this basename: AMMONIA:3 in the last 168 hours CBC:  Lab 02/04/12 0625 02/03/12 0820 02/02/12 1736  WBC 7.0 7.5 7.4  NEUTROABS -- -- 4.5  HGB 12.5 11.5* 12.0  HCT 40.5 37.1 40.5  MCV 92.7 90.3 92.7  PLT 120* 167 204   Cardiac Enzymes:  Lab 02/03/12 0039  CKTOTAL 52  CKMB 1.3  CKMBINDEX --  TROPONINI <0.30   CBG: No results found for this basename: GLUCAP:5 in the last 168 hours  Iron Studies: No results found for this basename: IRON,TIBC,TRANSFERRIN,FERRITIN in the last 72 hours Studies/Results: Ct Angio Chest W/cm &/or Wo Cm  02/03/2012  *RADIOLOGY REPORT*  Clinical Data:  Hemoptysis, VQ scan high probability for PE. Abdominal pain, prior abscess, failed renal transplant.  CT ANGIOGRAPHY CHEST CT ABDOMEN AND PELVIS WITH CONTRAST  Technique:  Multidetector CT imaging of the chest was performed using the standard protocol during bolus administration of intravenous contrast.   Multiplanar CT image reconstructions including MIPs were obtained to evaluate the vascular anatomy. Multidetector CT imaging of the abdomen and pelvis was performed using the standard protocol during bolus administration of intravenous contrast.  Contrast: 65mL OMNIPAQUE IOHEXOL 350 MG/ML SOLN  Comparison:  VQ scan dated 02/02/2012.  CT abdomen pelvis dated 04/17/2011.  CTA CHEST  Findings:  Segmental pulmonary embolus within a lingular branch of the left lower lobe pulmonary artery (series 6/image 102). Additional subsegmental pulmonary emboli within a branch of the right upper lobe pulmonary artery (series 6/image 85), right middle lobe pulmonary artery (series 6/image 117) and right lower lobe pulmonary artery (series 6/image 135).  Overall clot burden is moderate. No associated findings to suggest right heart strain.  Patchy opacities in the lingula, compatible with pulmonary infarct. Additional patchy opacity with central lucency in the medial right lung base (series 7/image 87), possibly reflecting pulmonary infarct, although infection with central necrosis is not excluded. Superimposed mild diffuse ground glass opacity, likely reflecting interstitial edema.  No pleural effusion or pneumothorax.  Visualized thyroid is unremarkable.  Cardiomegaly.  No pericardial effusion.  No suspicious mediastinal, hilar, or axillary lymphadenopathy.  Right chest dialysis catheter.  Visualized osseous structures  are within normal limits.   Review of the MIP images confirms the above findings.  IMPRESSION: Segmental pulmonary embolus within a lingular branch of the left lower lobe pulmonary artery.  Additional subsegmental pulmonary emboli in the right upper, middle, and lower lobes.  Overall clot burden is moderate.  Suspected pulmonary infarct in the lingula.  Pulmonary infarct versus infection with central necrosis in the medial right lung base.  Cardiomegaly with superimposed mild interstitial edema.  Critical  Value/emergent results were called by telephone at the time of interpretation on 02/03/2012  at 1740 hours  to  Dr. Hartley Barefoot, who verbally acknowledged these results.  CT ABDOMEN AND PELVIS  Findings: Heterogeneous perfusion of the liver with mild periportal edema.  Spleen, pancreas, and adrenal glands are within normal limits.  Vicarious excretion of contrast in the gallbladder with gallbladder wall thickening/edema in the fundus (series 5/image 35).  Native renal cortical atrophy with numerous bilateral cysts of varying sizes and complexity. Scattered areas of high density including along the upper pole of the left kidney (series 5/image 18) are favored to reflect interval development of calcifications rather than enhancing lesions.  No hydronephrosis.  No evidence of bowel obstruction.  Normal appendix.  No colonic wall thickening or inflammatory changes.  No evidence of abdominal aortic aneurysm.  Small to moderate abdominopelvic ascites.  Mild retroperitoneal/para-aortic lymph nodes measuring up to 10 mm short axis.  Postsurgical changes in the right lower quadrant from prior/failed renal transplant.  Uterus and bilateral ovaries are unremarkable.  Bladder is underdistended.  No evidence of retroperitoneal hemorrhage.  No evidence of intra- abdominal abscess.  Visualized osseous structures are within normal limits.  Review of the MIP images confirms the above findings.  IMPRESSION: Normal appendix.  No evidence of bowel obstruction.  No colonic wall thickening or inflammatory changes.  Gallbladder wall thickening/edema with mild periportal edema, likely reflecting hypoalbuminemia/overall volume state.  Associated small to moderate abdominopelvic ascites.  If there is clinical concern for acute cholecystitis, RUQ ultrasound can be performed for further evaluation.  Native renal cortical atrophy with numerous bilateral cysts of varying sizes and complexity and suspected calcifications. Postsurgical changes  related to prior/failed right lower quadrant renal transplant.  No CT findings to account for the patient's abdominal pain.  Original Report Authenticated By: Charline Bills, M.D.   Ct Abdomen Pelvis W Contrast  02/03/2012  *RADIOLOGY REPORT*  Clinical Data:  Hemoptysis, VQ scan high probability for PE. Abdominal pain, prior abscess, failed renal transplant.  CT ANGIOGRAPHY CHEST CT ABDOMEN AND PELVIS WITH CONTRAST  Technique:  Multidetector CT imaging of the chest was performed using the standard protocol during bolus administration of intravenous contrast.  Multiplanar CT image reconstructions including MIPs were obtained to evaluate the vascular anatomy. Multidetector CT imaging of the abdomen and pelvis was performed using the standard protocol during bolus administration of intravenous contrast.  Contrast: 65mL OMNIPAQUE IOHEXOL 350 MG/ML SOLN  Comparison:  VQ scan dated 02/02/2012.  CT abdomen pelvis dated 04/17/2011.  CTA CHEST  Findings:  Segmental pulmonary embolus within a lingular branch of the left lower lobe pulmonary artery (series 6/image 102). Additional subsegmental pulmonary emboli within a branch of the right upper lobe pulmonary artery (series 6/image 85), right middle lobe pulmonary artery (series 6/image 117) and right lower lobe pulmonary artery (series 6/image 135).  Overall clot burden is moderate. No associated findings to suggest right heart strain.  Patchy opacities in the lingula, compatible with pulmonary infarct. Additional patchy opacity with central lucency  in the medial right lung base (series 7/image 87), possibly reflecting pulmonary infarct, although infection with central necrosis is not excluded. Superimposed mild diffuse ground glass opacity, likely reflecting interstitial edema.  No pleural effusion or pneumothorax.  Visualized thyroid is unremarkable.  Cardiomegaly.  No pericardial effusion.  No suspicious mediastinal, hilar, or axillary lymphadenopathy.  Right chest  dialysis catheter.  Visualized osseous structures are within normal limits.   Review of the MIP images confirms the above findings.  IMPRESSION: Segmental pulmonary embolus within a lingular branch of the left lower lobe pulmonary artery.  Additional subsegmental pulmonary emboli in the right upper, middle, and lower lobes.  Overall clot burden is moderate.  Suspected pulmonary infarct in the lingula.  Pulmonary infarct versus infection with central necrosis in the medial right lung base.  Cardiomegaly with superimposed mild interstitial edema.  Critical Value/emergent results were called by telephone at the time of interpretation on 02/03/2012  at 1740 hours  to  Dr. Hartley Barefoot, who verbally acknowledged these results.  CT ABDOMEN AND PELVIS  Findings: Heterogeneous perfusion of the liver with mild periportal edema.  Spleen, pancreas, and adrenal glands are within normal limits.  Vicarious excretion of contrast in the gallbladder with gallbladder wall thickening/edema in the fundus (series 5/image 35).  Native renal cortical atrophy with numerous bilateral cysts of varying sizes and complexity. Scattered areas of high density including along the upper pole of the left kidney (series 5/image 18) are favored to reflect interval development of calcifications rather than enhancing lesions.  No hydronephrosis.  No evidence of bowel obstruction.  Normal appendix.  No colonic wall thickening or inflammatory changes.  No evidence of abdominal aortic aneurysm.  Small to moderate abdominopelvic ascites.  Mild retroperitoneal/para-aortic lymph nodes measuring up to 10 mm short axis.  Postsurgical changes in the right lower quadrant from prior/failed renal transplant.  Uterus and bilateral ovaries are unremarkable.  Bladder is underdistended.  No evidence of retroperitoneal hemorrhage.  No evidence of intra- abdominal abscess.  Visualized osseous structures are within normal limits.  Review of the MIP images confirms the  above findings.  IMPRESSION: Normal appendix.  No evidence of bowel obstruction.  No colonic wall thickening or inflammatory changes.  Gallbladder wall thickening/edema with mild periportal edema, likely reflecting hypoalbuminemia/overall volume state.  Associated small to moderate abdominopelvic ascites.  If there is clinical concern for acute cholecystitis, RUQ ultrasound can be performed for further evaluation.  Native renal cortical atrophy with numerous bilateral cysts of varying sizes and complexity and suspected calcifications. Postsurgical changes related to prior/failed right lower quadrant renal transplant.  No CT findings to account for the patient's abdominal pain.  Original Report Authenticated By: Charline Bills, M.D.   Nm Pulmonary Per & Vent  02/02/2012  *RADIOLOGY REPORT*  Clinical Data: Shortness of breath, cough, hemoptysis  NM PULMONARY VENTILATION AND PERFUSION SCAN  Radiopharmaceutical: CURIE xenon xe 133 gas 20 milli Curie XENON XE 133 GAS, CURIE MAA TECHNETIUM TO 24M ALBUMIN AGGREGATED  Comparison: Correlation with chest radiograph dated 02/02/2012  Findings: Ventilation images demonstrate decreased ventilation to the left lower lobe.  Perfusion images demonstrate a segmental filling defect to the anterior portion of the left lower lobe.  Additional possible subsegmental filling defect to the anterior right lower lobe.  Chest radiograph demonstrates mild cardiomegaly but no corresponding pulmonary opacities.  This appearance corresponds to a high probability for pulmonary embolism.  IMPRESSION: High probability for pulmonary embolism.  Critical Value/emergent results were called by telephone at the  time of interpretation on 02/02/2012  at 2345 hours  to  Dr. Effie Shy, who verbally acknowledged these results.  Original Report Authenticated By: Charline Bills, M.D.   Dg Chest Port 1 View  02/02/2012  *RADIOLOGY REPORT*  Clinical Data: Coughing up blood with abdominal pain   PORTABLE CHEST - 1 VIEW  Comparison: None.  Findings: There is a dialysis catheter from right IJ approach. Cardiac silhouette is enlarged.  There is mild central venous congestion.  No effusion, infiltrate, or pneumothorax.  IMPRESSION: Cardiomegaly  with mild central venous congestion.  Original Report Authenticated By: Genevive Bi, M.D.   Medications:    . heparin 1,100 Units/hr (02/03/12 2152)      . calcium acetate  1,334 mg Oral TID WC  . darbepoetin (ARANESP) injection - DIALYSIS  100 mcg Intravenous Q Tue-HD  . diphenhydrAMINE  25 mg Intravenous Once  . docusate sodium  100 mg Oral BID  . enalapril  10 mg Oral Daily  . ferric gluconate (FERRLECIT/NULECIT) IV  62.5 mg Intravenous Q Thu-HD  . hydrALAZINE  25 mg Oral BID WC  . isosorbide dinitrate  20 mg Oral TID  . multivitamin  1 tablet Oral Daily  . sodium chloride  3 mL Intravenous Q12H  . sodium chloride  3 mL Intravenous Q12H  . warfarin  7.5 mg Oral ONCE-1800  . warfarin   Does not apply Once  . Warfarin - Pharmacist Dosing Inpatient   Does not apply q1800  . DISCONTD: calcium acetate  667 mg Oral TID WC  . DISCONTD: warfarin  10 mg Oral ONCE-1800   I  have reviewed scheduled and prn medications.  Physical Exam: General: Alert, NAD Heart:RRR, no rub or murmur Lungs: CTA Abdomen: BS+ =, soft, minimal tenderness  Left  Lower quad. Extremities: Dialysis Access: trace bipedal edema, right ij perm cath, right upper arm avf no bruit .  Problem/Plan: 1. Bilateral Pulmonary Embolism=Heparin/coumadin bridge. This occurred around the same time the patient's RUA AVF clotted. IR imaged the RUE and said that clot was too extensive to do thrombolysis. We're asking [facility] to look at the case to see if AVF is salvageable, but one question to consider is whether this acute embolic event is related to the RUE thrombus. She also has a tunnelled catheter in the R IJ which has been in several months and is her current access.  2. ESRD  -TTHSAT, at [facility], Yesterday 3526  uf yest. To wt. 71.0 kg. , old edw 77 and tapering down,problem last week with pt .missing HD twice "diarrhea" 3. Anemia - hgb 12.5 yesterday 11.5 aranesp 100 will taper to weekly fu am Hgb. Weekly iron on hd 4. Secondary hyperparathyroidism - Ca=10.3 and phos 6.9 on phos lo 2 ac, and no zemplar,  Fu am renalprofile with ca corrected ~ 11.0 , change to Fosrenol 1gm ac 5. HTN/volume -bp=135/104, increase Hydralazine back to 50mg  bid, follow up with volume off with HD/ also on enalapril 10mg   Qday, Imdur 20mg  tid 6. Diarrhea= C. Diff. From this am pending 7. Abdominal Pain= improving < CT showed no abscess,GB thicking , wu per admit team 8. HD ACCESS/clotted AVF RUA, has R IJ permcath as well= Contact [facility] today to evaluate while in hospital with her ho noncompliance /"transportation problems''   Lenny Pastel, PA-C [facility] Beeper [phone] 02/04/2012,9:01 AMT  LOS: 2 days   Patient seen and examined and agree with assessment and plan as above with additions in  bold.   Vinson Moselle  MD [facility] [phone] pgr    [phone] cell 02/04/2012, 3:21 PM

## 2012-02-04 NOTE — Progress Notes (Signed)
  Echocardiogram 2D Echocardiogram has been performed.  Emelia Loron A 02/04/2012, 10:26 AM

## 2012-02-04 NOTE — Progress Notes (Signed)
Subjective: She relates chest pain is better, no dyspnea. Abdominal pain has improved. Cough improved.  Objective: Filed Vitals:   02/03/12 2230 02/03/12 2254 02/04/12 0509 02/04/12 1000  BP:  160/129 135/104 143/88  Pulse: 85 89 98 94  Temp:  96.9 F (36.1 C) 97.7 F (36.5 C) 97.9 F (36.6 C)  TempSrc:  Oral Oral Oral  Resp:  22 20 20   Height:      Weight:  71 kg (156 lb 8.4 oz)    SpO2:  95% 100% 100%   Weight change: -5.4 kg (-11 lb 14.5 oz)  Intake/Output Summary (Last 24 hours) at 02/04/12 1321 Last data filed at 02/04/12 0900  Gross per 24 hour  Intake    808 ml  Output   3526 ml  Net  -2718 ml    General: Alert, awake, oriented x3, in no acute distress.  HEENT: No bruits, no goiter.  Heart: Regular rate and rhythm, without murmurs, rubs, gallops.  Lungs: CTA, bilateral air movement.  Abdomen: Soft, nontender, nondistended, positive bowel sounds.  Neuro: Grossly intact, nonfocal. Extremities; no edema.  Lab Results:  Basename 02/03/12 0600 02/02/12 1736  NA 138 139  K 4.0 4.0  CL 96 96  CO2 22 20  GLUCOSE 101* 87  BUN 23 18  CREATININE 7.90* 6.65*  CALCIUM 10.3 10.5  MG 2.1 --  PHOS 6.9* --    Basename 02/03/12 0600  AST 19  ALT 11  ALKPHOS 51  BILITOT 1.0  PROT 6.9  ALBUMIN 3.3*   No results found for this basename: LIPASE:2,AMYLASE:2 in the last 72 hours  Basename 02/04/12 0625 02/03/12 0820 02/02/12 1736  WBC 7.0 7.5 --  NEUTROABS -- -- 4.5  HGB 12.5 11.5* --  HCT 40.5 37.1 --  MCV 92.7 90.3 --  PLT 120* 167 --    Basename 02/03/12 0039  CKTOTAL 52  CKMB 1.3  CKMBINDEX --  TROPONINI <0.30    Basename 02/04/12 0625 02/03/12 0600  TSH -- 10.100*  T4TOTAL -- --  T3FREE 2.7 --  THYROIDAB -- --    Micro Results: Recent Results (from the past 240 hour(s))  MRSA PCR SCREENING     Status: Normal   Collection Time   02/03/12  3:57 AM      Component Value Range Status Comment   MRSA by PCR NEGATIVE  NEGATIVE  Final   CLOSTRIDIUM  DIFFICILE BY PCR     Status: Normal   Collection Time   02/04/12  8:56 AM      Component Value Range Status Comment   C difficile by pcr NEGATIVE  NEGATIVE  Final     Studies/Results: Ct Angio Chest W/cm &/or Wo Cm  02/03/2012  *RADIOLOGY REPORT*  Clinical Data:  Hemoptysis, VQ scan high probability for PE. Abdominal pain, prior abscess, failed renal transplant.  CT ANGIOGRAPHY CHEST CT ABDOMEN AND PELVIS WITH CONTRAST  Technique:  Multidetector CT imaging of the chest was performed using the standard protocol during bolus administration of intravenous contrast.  Multiplanar CT image reconstructions including MIPs were obtained to evaluate the vascular anatomy. Multidetector CT imaging of the abdomen and pelvis was performed using the standard protocol during bolus administration of intravenous contrast.  Contrast: 65mL OMNIPAQUE IOHEXOL 350 MG/ML SOLN  Comparison:  VQ scan dated 02/02/2012.  CT abdomen pelvis dated 04/17/2011.  CTA CHEST  Findings:  Segmental pulmonary embolus within a lingular branch of the left lower lobe pulmonary artery (series 6/image 102). Additional subsegmental pulmonary emboli  within a branch of the right upper lobe pulmonary artery (series 6/image 85), right middle lobe pulmonary artery (series 6/image 117) and right lower lobe pulmonary artery (series 6/image 135).  Overall clot burden is moderate. No associated findings to suggest right heart strain.  Patchy opacities in the lingula, compatible with pulmonary infarct. Additional patchy opacity with central lucency in the medial right lung base (series 7/image 87), possibly reflecting pulmonary infarct, although infection with central necrosis is not excluded. Superimposed mild diffuse ground glass opacity, likely reflecting interstitial edema.  No pleural effusion or pneumothorax.  Visualized thyroid is unremarkable.  Cardiomegaly.  No pericardial effusion.  No suspicious mediastinal, hilar, or axillary lymphadenopathy.  Right  chest dialysis catheter.  Visualized osseous structures are within normal limits.   Review of the MIP images confirms the above findings.  IMPRESSION: Segmental pulmonary embolus within a lingular branch of the left lower lobe pulmonary artery.  Additional subsegmental pulmonary emboli in the right upper, middle, and lower lobes.  Overall clot burden is moderate.  Suspected pulmonary infarct in the lingula.  Pulmonary infarct versus infection with central necrosis in the medial right lung base.  Cardiomegaly with superimposed mild interstitial edema.  Critical Value/emergent results were called by telephone at the time of interpretation on 02/03/2012  at 1740 hours  to  Dr. Hartley Barefoot, who verbally acknowledged these results.  CT ABDOMEN AND PELVIS  Findings: Heterogeneous perfusion of the liver with mild periportal edema.  Spleen, pancreas, and adrenal glands are within normal limits.  Vicarious excretion of contrast in the gallbladder with gallbladder wall thickening/edema in the fundus (series 5/image 35).  Native renal cortical atrophy with numerous bilateral cysts of varying sizes and complexity. Scattered areas of high density including along the upper pole of the left kidney (series 5/image 18) are favored to reflect interval development of calcifications rather than enhancing lesions.  No hydronephrosis.  No evidence of bowel obstruction.  Normal appendix.  No colonic wall thickening or inflammatory changes.  No evidence of abdominal aortic aneurysm.  Small to moderate abdominopelvic ascites.  Mild retroperitoneal/para-aortic lymph nodes measuring up to 10 mm short axis.  Postsurgical changes in the right lower quadrant from prior/failed renal transplant.  Uterus and bilateral ovaries are unremarkable.  Bladder is underdistended.  No evidence of retroperitoneal hemorrhage.  No evidence of intra- abdominal abscess.  Visualized osseous structures are within normal limits.  Review of the MIP images  confirms the above findings.  IMPRESSION: Normal appendix.  No evidence of bowel obstruction.  No colonic wall thickening or inflammatory changes.  Gallbladder wall thickening/edema with mild periportal edema, likely reflecting hypoalbuminemia/overall volume state.  Associated small to moderate abdominopelvic ascites.  If there is clinical concern for acute cholecystitis, RUQ ultrasound can be performed for further evaluation.  Native renal cortical atrophy with numerous bilateral cysts of varying sizes and complexity and suspected calcifications. Postsurgical changes related to prior/failed right lower quadrant renal transplant.  No CT findings to account for the patient's abdominal pain.  Original Report Authenticated By: Charline Bills, M.D.   Ct Abdomen Pelvis W Contrast  02/03/2012  *RADIOLOGY REPORT*  Clinical Data:  Hemoptysis, VQ scan high probability for PE. Abdominal pain, prior abscess, failed renal transplant.  CT ANGIOGRAPHY CHEST CT ABDOMEN AND PELVIS WITH CONTRAST  Technique:  Multidetector CT imaging of the chest was performed using the standard protocol during bolus administration of intravenous contrast.  Multiplanar CT image reconstructions including MIPs were obtained to evaluate the vascular anatomy. Multidetector CT imaging  of the abdomen and pelvis was performed using the standard protocol during bolus administration of intravenous contrast.  Contrast: 65mL OMNIPAQUE IOHEXOL 350 MG/ML SOLN  Comparison:  VQ scan dated 02/02/2012.  CT abdomen pelvis dated 04/17/2011.  CTA CHEST  Findings:  Segmental pulmonary embolus within a lingular branch of the left lower lobe pulmonary artery (series 6/image 102). Additional subsegmental pulmonary emboli within a branch of the right upper lobe pulmonary artery (series 6/image 85), right middle lobe pulmonary artery (series 6/image 117) and right lower lobe pulmonary artery (series 6/image 135).  Overall clot burden is moderate. No associated findings  to suggest right heart strain.  Patchy opacities in the lingula, compatible with pulmonary infarct. Additional patchy opacity with central lucency in the medial right lung base (series 7/image 87), possibly reflecting pulmonary infarct, although infection with central necrosis is not excluded. Superimposed mild diffuse ground glass opacity, likely reflecting interstitial edema.  No pleural effusion or pneumothorax.  Visualized thyroid is unremarkable.  Cardiomegaly.  No pericardial effusion.  No suspicious mediastinal, hilar, or axillary lymphadenopathy.  Right chest dialysis catheter.  Visualized osseous structures are within normal limits.   Review of the MIP images confirms the above findings.  IMPRESSION: Segmental pulmonary embolus within a lingular branch of the left lower lobe pulmonary artery.  Additional subsegmental pulmonary emboli in the right upper, middle, and lower lobes.  Overall clot burden is moderate.  Suspected pulmonary infarct in the lingula.  Pulmonary infarct versus infection with central necrosis in the medial right lung base.  Cardiomegaly with superimposed mild interstitial edema.  Critical Value/emergent results were called by telephone at the time of interpretation on 02/03/2012  at 1740 hours  to  Dr. Hartley Barefoot, who verbally acknowledged these results.  CT ABDOMEN AND PELVIS  Findings: Heterogeneous perfusion of the liver with mild periportal edema.  Spleen, pancreas, and adrenal glands are within normal limits.  Vicarious excretion of contrast in the gallbladder with gallbladder wall thickening/edema in the fundus (series 5/image 35).  Native renal cortical atrophy with numerous bilateral cysts of varying sizes and complexity. Scattered areas of high density including along the upper pole of the left kidney (series 5/image 18) are favored to reflect interval development of calcifications rather than enhancing lesions.  No hydronephrosis.  No evidence of bowel obstruction.  Normal  appendix.  No colonic wall thickening or inflammatory changes.  No evidence of abdominal aortic aneurysm.  Small to moderate abdominopelvic ascites.  Mild retroperitoneal/para-aortic lymph nodes measuring up to 10 mm short axis.  Postsurgical changes in the right lower quadrant from prior/failed renal transplant.  Uterus and bilateral ovaries are unremarkable.  Bladder is underdistended.  No evidence of retroperitoneal hemorrhage.  No evidence of intra- abdominal abscess.  Visualized osseous structures are within normal limits.  Review of the MIP images confirms the above findings.  IMPRESSION: Normal appendix.  No evidence of bowel obstruction.  No colonic wall thickening or inflammatory changes.  Gallbladder wall thickening/edema with mild periportal edema, likely reflecting hypoalbuminemia/overall volume state.  Associated small to moderate abdominopelvic ascites.  If there is clinical concern for acute cholecystitis, RUQ ultrasound can be performed for further evaluation.  Native renal cortical atrophy with numerous bilateral cysts of varying sizes and complexity and suspected calcifications. Postsurgical changes related to prior/failed right lower quadrant renal transplant.  No CT findings to account for the patient's abdominal pain.  Original Report Authenticated By: Charline Bills, M.D.   Nm Pulmonary Per & Vent  02/02/2012  *RADIOLOGY REPORT*  Clinical Data: Shortness of breath, cough, hemoptysis  NM PULMONARY VENTILATION AND PERFUSION SCAN  Radiopharmaceutical: CURIE xenon xe 133 gas 20 milli Curie XENON XE 133 GAS, CURIE MAA TECHNETIUM TO 6M ALBUMIN AGGREGATED  Comparison: Correlation with chest radiograph dated 02/02/2012  Findings: Ventilation images demonstrate decreased ventilation to the left lower lobe.  Perfusion images demonstrate a segmental filling defect to the anterior portion of the left lower lobe.  Additional possible subsegmental filling defect to the anterior right  lower lobe.  Chest radiograph demonstrates mild cardiomegaly but no corresponding pulmonary opacities.  This appearance corresponds to a high probability for pulmonary embolism.  IMPRESSION: High probability for pulmonary embolism.  Critical Value/emergent results were called by telephone at the time of interpretation on 02/02/2012  at 2345 hours  to  Dr. Effie Shy, who verbally acknowledged these results.  Original Report Authenticated By: Charline Bills, M.D.   Dg Chest Port 1 View  02/02/2012  *RADIOLOGY REPORT*  Clinical Data: Coughing up blood with abdominal pain  PORTABLE CHEST - 1 VIEW  Comparison: None.  Findings: There is a dialysis catheter from right IJ approach. Cardiac silhouette is enlarged.  There is mild central venous congestion.  No effusion, infiltrate, or pneumothorax.  IMPRESSION: Cardiomegaly  with mild central venous congestion.  Original Report Authenticated By: Genevive Bi, M.D.    Medications: I have reviewed the patient's current medications.  Pulmonary Embolism:  CT show PE, pulmonary infarct would explain hemoptysis. Continue with heparin. I will start coumadin. Platelet decreases, if decreases further will consider HIT.  Will consider transition to Lovenox tomorrow.   ESRD (end stage renal disease) (02/03/2012) Renal following.  HTN;  Hydralazine and lisinopril.   Diarrhea;  C diff negative.   Left side lower quadrant Abdominal pain: CT negative for acute finding. Needs to follow up with PCP.     LOS: 2 days   Gabrianna Fassnacht M.D.  [facility] 02/04/2012, 1:21 PM

## 2012-02-05 ENCOUNTER — Other Ambulatory Visit: Payer: Self-pay

## 2012-02-05 DIAGNOSIS — Z992 Dependence on renal dialysis: Secondary | ICD-10-CM

## 2012-02-05 DIAGNOSIS — N186 End stage renal disease: Secondary | ICD-10-CM

## 2012-02-05 DIAGNOSIS — Z01810 Encounter for preprocedural cardiovascular examination: Secondary | ICD-10-CM

## 2012-02-05 LAB — PROTEIN S, TOTAL: Protein S Ag, Total: 84 % (ref 60–150)

## 2012-02-05 LAB — CBC
HCT: 39.2 % (ref 36.0–46.0)
Hemoglobin: 12.3 g/dL (ref 12.0–15.0)
MCH: 28.7 pg (ref 26.0–34.0)
MCHC: 31.4 g/dL (ref 30.0–36.0)
MCV: 91.4 fL (ref 78.0–100.0)
RBC: 4.29 MIL/uL (ref 3.87–5.11)

## 2012-02-05 LAB — PROTEIN C ACTIVITY: Protein C Activity: 116 % (ref 75–133)

## 2012-02-05 LAB — RENAL FUNCTION PANEL
Albumin: 3.1 g/dL — ABNORMAL LOW (ref 3.5–5.2)
BUN: 21 mg/dL (ref 6–23)
CO2: 19 mEq/L (ref 19–32)
Chloride: 96 mEq/L (ref 96–112)
Potassium: 4.2 mEq/L (ref 3.5–5.1)

## 2012-02-05 LAB — APTT: aPTT: 62 seconds — ABNORMAL HIGH (ref 24–37)

## 2012-02-05 LAB — PROTEIN S ACTIVITY: Protein S Activity: 127 % (ref 69–129)

## 2012-02-05 LAB — PROTEIN C, TOTAL: Protein C, Total: 64 % — ABNORMAL LOW (ref 72–160)

## 2012-02-05 LAB — SEDIMENTATION RATE: Sed Rate: 5 mm/hr (ref 0–22)

## 2012-02-05 LAB — PROTIME-INR: Prothrombin Time: 14.4 seconds (ref 11.6–15.2)

## 2012-02-05 MED ORDER — HEPARIN (PORCINE) IN NACL 100-0.45 UNIT/ML-% IJ SOLN
1350.0000 [IU]/h | INTRAMUSCULAR | Status: DC
  Filled 2012-02-05: qty 250

## 2012-02-05 MED ORDER — WARFARIN SODIUM 7.5 MG PO TABS
7.5000 mg | ORAL_TABLET | Freq: Once | ORAL | Status: DC
  Filled 2012-02-05: qty 1

## 2012-02-05 MED ORDER — CARVEDILOL 6.25 MG PO TABS
6.2500 mg | ORAL_TABLET | Freq: Two times a day (BID) | ORAL | Status: DC
  Administered 2012-02-05 – 2012-02-14 (×14): 6.25 mg via ORAL
  Filled 2012-02-05 (×20): qty 1

## 2012-02-05 MED ORDER — ARGATROBAN 250 MG/2.5ML IV SOLN
1.0000 ug/kg/min | INTRAVENOUS | Status: AC
  Administered 2012-02-05 – 2012-02-07 (×2): 1 ug/kg/min via INTRAVENOUS
  Filled 2012-02-05 (×3): qty 2.5

## 2012-02-05 MED ORDER — OXYCODONE HCL 5 MG PO TABS
5.0000 mg | ORAL_TABLET | ORAL | Status: DC | PRN
  Administered 2012-02-14: 10 mg via ORAL
  Filled 2012-02-05 (×2): qty 2

## 2012-02-05 NOTE — Progress Notes (Signed)
Subjective: Feeling ok had chest pain earlier this morning.  Objective: Filed Vitals:   02/04/12 2100 02/05/12 0500 02/05/12 0522 02/05/12 0926  BP: 149/98 120/104  131/93  Pulse: 98 58  101  Temp: 97.9 F (36.6 C) 97.6 F (36.4 C) 97.8 F (36.6 C) 97.8 F (36.6 C)  TempSrc: Oral Oral Oral Oral  Resp: 20 18  18   Height:      Weight: 70 kg (154 lb 5.2 oz)     SpO2: 100% 100%  100%   Weight change: -1 kg (-2 lb 3.3 oz)  Intake/Output Summary (Last 24 hours) at 02/05/12 1045 Last data filed at 02/05/12 0900  Gross per 24 hour  Intake  710.4 ml  Output      0 ml  Net  710.4 ml    General: Alert, awake, oriented x3, in no acute distress.  HEENT: No bruits, no goiter.  Heart: Regular rate and rhythm, without murmurs, rubs, gallops.  Lungs: CTA, bilateral air movement.  Abdomen: Soft, nontender, nondistended, positive bowel sounds.  Neuro: Grossly intact, nonfocal. Extremities; no edema.  Lab Results:  Basename 02/05/12 0610 02/03/12 0600  NA 134* 138  K 4.2 4.0  CL 96 96  CO2 19 22  GLUCOSE 78 101*  BUN 21 23  CREATININE 6.52* 7.90*  CALCIUM 9.8 10.3  MG -- 2.1  PHOS 3.7 6.9*    Basename 02/05/12 0610 02/03/12 0600  AST -- 19  ALT -- 11  ALKPHOS -- 51  BILITOT -- 1.0  PROT -- 6.9  ALBUMIN 3.1* 3.3*   Basename 02/05/12 0610 02/04/12 0625 02/02/12 1736  WBC 6.7 7.0 --  NEUTROABS -- -- 4.5  HGB 12.3 12.5 --  HCT 39.2 40.5 --  MCV 91.4 92.7 --  PLT 105* 120* --    Basename 02/03/12 0039  CKTOTAL 52  CKMB 1.3  CKMBINDEX --  TROPONINI <0.30   Basename 02/04/12 0625 02/03/12 0600  TSH -- 10.100*  T4TOTAL -- --  T3FREE 2.7 --  THYROIDAB -- --    Micro Results: Recent Results (from the past 240 hour(s))  MRSA PCR SCREENING     Status: Normal   Collection Time   02/03/12  3:57 AM      Component Value Range Status Comment   MRSA by PCR NEGATIVE  NEGATIVE  Final   CLOSTRIDIUM DIFFICILE BY PCR     Status: Normal   Collection Time   02/04/12  8:56  AM      Component Value Range Status Comment   C difficile by pcr NEGATIVE  NEGATIVE  Final     Studies/Results: Ct Angio Chest W/cm &/or Wo Cm  02/03/2012  *RADIOLOGY REPORT*  Clinical Data:  Hemoptysis, VQ scan high probability for PE. Abdominal pain, prior abscess, failed renal transplant.  CT ANGIOGRAPHY CHEST CT ABDOMEN AND PELVIS WITH CONTRAST  Technique:  Multidetector CT imaging of the chest was performed using the standard protocol during bolus administration of intravenous contrast.  Multiplanar CT image reconstructions including MIPs were obtained to evaluate the vascular anatomy. Multidetector CT imaging of the abdomen and pelvis was performed using the standard protocol during bolus administration of intravenous contrast.  Contrast: 65mL OMNIPAQUE IOHEXOL 350 MG/ML SOLN  Comparison:  VQ scan dated 02/02/2012.  CT abdomen pelvis dated 04/17/2011.  CTA CHEST  Findings:  Segmental pulmonary embolus within a lingular branch of the left lower lobe pulmonary artery (series 6/image 102). Additional subsegmental pulmonary emboli within a branch of the right upper lobe  pulmonary artery (series 6/image 85), right middle lobe pulmonary artery (series 6/image 117) and right lower lobe pulmonary artery (series 6/image 135).  Overall clot burden is moderate. No associated findings to suggest right heart strain.  Patchy opacities in the lingula, compatible with pulmonary infarct. Additional patchy opacity with central lucency in the medial right lung base (series 7/image 87), possibly reflecting pulmonary infarct, although infection with central necrosis is not excluded. Superimposed mild diffuse ground glass opacity, likely reflecting interstitial edema.  No pleural effusion or pneumothorax.  Visualized thyroid is unremarkable.  Cardiomegaly.  No pericardial effusion.  No suspicious mediastinal, hilar, or axillary lymphadenopathy.  Right chest dialysis catheter.  Visualized osseous structures are within normal  limits.   Review of the MIP images confirms the above findings.  IMPRESSION: Segmental pulmonary embolus within a lingular branch of the left lower lobe pulmonary artery.  Additional subsegmental pulmonary emboli in the right upper, middle, and lower lobes.  Overall clot burden is moderate.  Suspected pulmonary infarct in the lingula.  Pulmonary infarct versus infection with central necrosis in the medial right lung base.  Cardiomegaly with superimposed mild interstitial edema.  Critical Value/emergent results were called by telephone at the time of interpretation on 02/03/2012  at 1740 hours  to  Dr. Hartley Barefoot, who verbally acknowledged these results.  CT ABDOMEN AND PELVIS  Findings: Heterogeneous perfusion of the liver with mild periportal edema.  Spleen, pancreas, and adrenal glands are within normal limits.  Vicarious excretion of contrast in the gallbladder with gallbladder wall thickening/edema in the fundus (series 5/image 35).  Native renal cortical atrophy with numerous bilateral cysts of varying sizes and complexity. Scattered areas of high density including along the upper pole of the left kidney (series 5/image 18) are favored to reflect interval development of calcifications rather than enhancing lesions.  No hydronephrosis.  No evidence of bowel obstruction.  Normal appendix.  No colonic wall thickening or inflammatory changes.  No evidence of abdominal aortic aneurysm.  Small to moderate abdominopelvic ascites.  Mild retroperitoneal/para-aortic lymph nodes measuring up to 10 mm short axis.  Postsurgical changes in the right lower quadrant from prior/failed renal transplant.  Uterus and bilateral ovaries are unremarkable.  Bladder is underdistended.  No evidence of retroperitoneal hemorrhage.  No evidence of intra- abdominal abscess.  Visualized osseous structures are within normal limits.  Review of the MIP images confirms the above findings.  IMPRESSION: Normal appendix.  No evidence of bowel  obstruction.  No colonic wall thickening or inflammatory changes.  Gallbladder wall thickening/edema with mild periportal edema, likely reflecting hypoalbuminemia/overall volume state.  Associated small to moderate abdominopelvic ascites.  If there is clinical concern for acute cholecystitis, RUQ ultrasound can be performed for further evaluation.  Native renal cortical atrophy with numerous bilateral cysts of varying sizes and complexity and suspected calcifications. Postsurgical changes related to prior/failed right lower quadrant renal transplant.  No CT findings to account for the patient's abdominal pain.  Original Report Authenticated By: Charline Bills, M.D.   Ct Abdomen Pelvis W Contrast  02/03/2012  *RADIOLOGY REPORT*  Clinical Data:  Hemoptysis, VQ scan high probability for PE. Abdominal pain, prior abscess, failed renal transplant.  CT ANGIOGRAPHY CHEST CT ABDOMEN AND PELVIS WITH CONTRAST  Technique:  Multidetector CT imaging of the chest was performed using the standard protocol during bolus administration of intravenous contrast.  Multiplanar CT image reconstructions including MIPs were obtained to evaluate the vascular anatomy. Multidetector CT imaging of the abdomen and pelvis was performed using  the standard protocol during bolus administration of intravenous contrast.  Contrast: 65mL OMNIPAQUE IOHEXOL 350 MG/ML SOLN  Comparison:  VQ scan dated 02/02/2012.  CT abdomen pelvis dated 04/17/2011.  CTA CHEST  Findings:  Segmental pulmonary embolus within a lingular branch of the left lower lobe pulmonary artery (series 6/image 102). Additional subsegmental pulmonary emboli within a branch of the right upper lobe pulmonary artery (series 6/image 85), right middle lobe pulmonary artery (series 6/image 117) and right lower lobe pulmonary artery (series 6/image 135).  Overall clot burden is moderate. No associated findings to suggest right heart strain.  Patchy opacities in the lingula, compatible with  pulmonary infarct. Additional patchy opacity with central lucency in the medial right lung base (series 7/image 87), possibly reflecting pulmonary infarct, although infection with central necrosis is not excluded. Superimposed mild diffuse ground glass opacity, likely reflecting interstitial edema.  No pleural effusion or pneumothorax.  Visualized thyroid is unremarkable.  Cardiomegaly.  No pericardial effusion.  No suspicious mediastinal, hilar, or axillary lymphadenopathy.  Right chest dialysis catheter.  Visualized osseous structures are within normal limits.   Review of the MIP images confirms the above findings.  IMPRESSION: Segmental pulmonary embolus within a lingular branch of the left lower lobe pulmonary artery.  Additional subsegmental pulmonary emboli in the right upper, middle, and lower lobes.  Overall clot burden is moderate.  Suspected pulmonary infarct in the lingula.  Pulmonary infarct versus infection with central necrosis in the medial right lung base.  Cardiomegaly with superimposed mild interstitial edema.  Critical Value/emergent results were called by telephone at the time of interpretation on 02/03/2012  at 1740 hours  to  Dr. Hartley Barefoot, who verbally acknowledged these results.  CT ABDOMEN AND PELVIS  Findings: Heterogeneous perfusion of the liver with mild periportal edema.  Spleen, pancreas, and adrenal glands are within normal limits.  Vicarious excretion of contrast in the gallbladder with gallbladder wall thickening/edema in the fundus (series 5/image 35).  Native renal cortical atrophy with numerous bilateral cysts of varying sizes and complexity. Scattered areas of high density including along the upper pole of the left kidney (series 5/image 18) are favored to reflect interval development of calcifications rather than enhancing lesions.  No hydronephrosis.  No evidence of bowel obstruction.  Normal appendix.  No colonic wall thickening or inflammatory changes.  No evidence of  abdominal aortic aneurysm.  Small to moderate abdominopelvic ascites.  Mild retroperitoneal/para-aortic lymph nodes measuring up to 10 mm short axis.  Postsurgical changes in the right lower quadrant from prior/failed renal transplant.  Uterus and bilateral ovaries are unremarkable.  Bladder is underdistended.  No evidence of retroperitoneal hemorrhage.  No evidence of intra- abdominal abscess.  Visualized osseous structures are within normal limits.  Review of the MIP images confirms the above findings.  IMPRESSION: Normal appendix.  No evidence of bowel obstruction.  No colonic wall thickening or inflammatory changes.  Gallbladder wall thickening/edema with mild periportal edema, likely reflecting hypoalbuminemia/overall volume state.  Associated small to moderate abdominopelvic ascites.  If there is clinical concern for acute cholecystitis, RUQ ultrasound can be performed for further evaluation.  Native renal cortical atrophy with numerous bilateral cysts of varying sizes and complexity and suspected calcifications. Postsurgical changes related to prior/failed right lower quadrant renal transplant.  No CT findings to account for the patient's abdominal pain.  Original Report Authenticated By: Charline Bills, M.D.    Medications: I have reviewed the patient's current medications.  Pulmonary Embolism:  CT show PE, pulmonary infarct would  explain hemoptysis. Platelet decrease more than 50 %. I will order HIT panel per pharmacy protocol. Renal recommend to use argatroban. Will start anticoagulation per pharmacy protocol. Stop heparin.   Cardiomyopathy: ECHO show EF 20 % , I reviewed records from dialysis center to mention of history of cardiomyopathy. I will consult cardio. Patient already on lisinopril. Check ESR, ANA, DNA double strand. Cardio consulted.   Increase TSH: Free T3 normal. Free T4 mild elevated. Active form is normal. She will need repeat thyroid function test in 4 weeks.   ESRD (end  stage renal disease) (02/03/2012) Renal following.   HTN;  Hydralazine and lisinopril.   Diarrhea;  C diff negative.  Improved.   Left side lower quadrant Abdominal pain: CT negative for acute finding. Needs to follow up with PCP.     LOS: 3 days   Read Bonelli M.D.  [facility] 02/05/2012, 10:45 AM

## 2012-02-05 NOTE — Progress Notes (Signed)
VASCULAR LAB PRELIMINARY  PRELIMINARY  PRELIMINARY  PRELIMINARY  Left Upper Extremity Vein Map    Cephalic  Segment Diameter Depth Comment  1. Axilla 3.30mm mm   2. Mid upper arm 4.5mm mm   3. Above AC 3.79mm mm thrombus  4. In [facility] 3.9mm mm thrombus  5. Below AC 2.62mm mm branch  6. Mid forearm 2.68mm mm   7. Wrist 2.69mm mm    mm mm    mm mm    mm mm    Basilic  Segment Diameter Depth Comment  1. Axilla 5.42mm 8.35mm origin  2. Mid upper arm 4.61mm 17.49mm   3. Above AC mm mm Not visualized due to IV  4. In [facility] 5.57mm 11.19mm   5. Below AC 1.48mm 4.75mm   6. Mid forearm 2.39mm 3.71mm   7. Wrist 1.44mm 1.61mm    mm mm    mm mm    mm mm     Farrel Demark, RDMS 02/05/2012, 3:06 PM

## 2012-02-05 NOTE — Consult Note (Signed)
Reason for Consult:  depressed LV systolic function by 2-D echo Referring Physician: [facility] hospital[facility]  [name] Small is an 21 y.o. female.  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 Patient subsequently had 2-D echo done which showed global hypokinesia with EF of approximately 15-20%. Patient denies any history of PND and the skin history of orthopnea her but denies any leg swelling. Denies any recent viral infection. Denies any history of alcohol abuse. Patient states she has high blood pressure since childhood which was attributed to polycystic kidney disease. Patient denies any history of anginal chest pain. Denies any palpitation lightheadedness or syncopal episode. Denies any history of sudden cardiac death in the family or history of congestive cardiomyopathy . Patient denies any cocaine or drug abuse. Patient presently denies any chest pain shortness of breath but complains of vague right sided lower abdominal pain. Denies any cough fever chills denies any urinary complaints  Past Medical History  Diagnosis Date  . Renal failure   . Hemodialysis patient   . Hypertension   . Polycystic kidney disease     Past Surgical History  Procedure Date  . Nephrectomy   . Av fistula placement   . Kidney transplant 2008    failed    Family History  Problem Relation Age of Onset  . Polycystic kidney disease Father     Social History:  reports that she has quit smoking. She does not have any smokeless tobacco history on file. She reports that she does not drink alcohol or use illicit drugs.  Allergies:   Allergies  Allergen Reactions  . Morphine And Related Itching    Medications: I have reviewed the patient's current medications.  Results for orders placed during the hospital encounter of 02/02/12 (from the past 48 hour(s))  PREGNANCY, URINE     Status: Normal   Collection Time   02/03/12  4:10 PM      Component Value Range Comment   Preg Test, Ur NEGATIVE  NEGATIVE    HEPARIN LEVEL (UNFRACTIONATED)     Status: Normal   Collection Time   02/04/12  6:25 AM      Component Value Range Comment   Heparin Unfractionated 0.44  0.30 - 0.70 (IU/mL)   CBC     Status: Abnormal   Collection Time   02/04/12  6:25 AM      Component Value Range Comment   WBC 7.0  4.0 - 10.5 (K/uL)    RBC 4.37  3.87 - 5.11 (MIL/uL)    Hemoglobin 12.5  12.0 - 15.0 (g/dL)    HCT 16.1  09.6 - 04.5 (%)    MCV 92.7  78.0 - 100.0 (fL)    MCH 28.6  26.0 - 34.0 (pg)    MCHC 30.9  30.0 - 36.0 (g/dL)    RDW 40.9 (*) 81.1 - 15.5 (%)    Platelets 120 (*) 150 - 400 (K/uL)   T3, FREE     Status: Normal   Collection Time   02/04/12  6:25 AM      Component Value Range Comment   T3, Free 2.7  2.3 - 4.2 (pg/mL)   T4, FREE  Status: Abnormal   Collection Time   02/04/12  6:25 AM      Component Value Range Comment   Free T4 2.10 (*) 0.80 - 1.80 (ng/dL)   CLOSTRIDIUM DIFFICILE BY PCR     Status: Normal   Collection Time   02/04/12  8:56 AM      Component Value Range Comment   C difficile by pcr NEGATIVE  NEGATIVE    HEPARIN LEVEL (UNFRACTIONATED)     Status: Abnormal   Collection Time   02/05/12  6:10 AM      Component Value Range Comment   Heparin Unfractionated 0.28 (*) 0.30 - 0.70 (IU/mL)   CBC     Status: Abnormal   Collection Time   02/05/12  6:10 AM      Component Value Range Comment   WBC 6.7  4.0 - 10.5 (K/uL)    RBC 4.29  3.87 - 5.11 (MIL/uL)    Hemoglobin 12.3  12.0 - 15.0 (g/dL)    HCT 30.8  65.7 - 84.6 (%)    MCV 91.4  78.0 - 100.0 (fL)    MCH 28.7  26.0 - 34.0 (pg)    MCHC 31.4  30.0 - 36.0 (g/dL)      RDW 96.2 (*) 95.2 - 15.5 (%)    Platelets 105 (*) 150 - 400 (K/uL)   PROTIME-INR     Status: Normal   Collection Time   02/05/12  6:10 AM      Component Value Range Comment   Prothrombin Time 14.4  11.6 - 15.2 (seconds)    INR 1.10  0.00 - 1.49    RENAL FUNCTION PANEL     Status: Abnormal   Collection Time   02/05/12  6:10 AM      Component Value Range Comment   Sodium 134 (*) 135 - 145 (mEq/L)    Potassium 4.2  3.5 - 5.1 (mEq/L) HEMOLYSIS AT THIS LEVEL MAY AFFECT RESULT   Chloride 96  96 - 112 (mEq/L)    CO2 19  19 - 32 (mEq/L)    Glucose, Bld 78  70 - 99 (mg/dL)    BUN 21  6 - 23 (mg/dL)    Creatinine, Ser 8.41 (*) 0.50 - 1.10 (mg/dL)    Calcium 9.8  8.4 - 10.5 (mg/dL)    Phosphorus 3.7  2.3 - 4.6 (mg/dL)    Albumin 3.1 (*) 3.5 - 5.2 (g/dL)    GFR calc non Af Amer 8 (*) >90 (mL/min)    GFR calc Af Amer 10 (*) >90 (mL/min)   APTT     Status: Abnormal   Collection Time   02/05/12 12:26 PM      Component Value Range Comment   aPTT 72 (*) 24 - 37 (seconds)     Ct Angio Chest W/cm &/or Wo Cm  02/03/2012  *RADIOLOGY REPORT*  Clinical Data:  Hemoptysis, VQ scan high probability for PE. Abdominal pain, prior abscess, failed renal transplant.  CT ANGIOGRAPHY CHEST CT ABDOMEN AND PELVIS WITH CONTRAST  Technique:  Multidetector CT imaging of the chest was performed using the standard protocol during bolus administration of intravenous contrast.  Multiplanar CT image reconstructions including MIPs were obtained to evaluate the vascular anatomy. Multidetector CT imaging of the abdomen and pelvis was performed using the standard protocol during bolus administration of intravenous contrast.  Contrast: 65mL OMNIPAQUE IOHEXOL 350 MG/ML SOLN  Comparison:  VQ scan dated 02/02/2012.  CT abdomen pelvis dated 04/17/2011.  CTA CHEST  Findings:  Segmental pulmonary embolus within a lingular branch of the left lower lobe pulmonary artery (series 6/image 102). Additional subsegmental pulmonary emboli within  a branch of the right upper lobe pulmonary artery (series 6/image 85), right middle lobe pulmonary artery (series 6/image 117) and right lower lobe pulmonary artery (series 6/image 135).  Overall clot burden is moderate. No associated findings to suggest right heart strain.  Patchy opacities in the lingula, compatible with pulmonary infarct. Additional patchy opacity with central lucency in the medial right lung base (series 7/image 87), possibly reflecting pulmonary infarct, although infection with central necrosis is not excluded. Superimposed mild diffuse ground glass opacity, likely reflecting interstitial edema.  No pleural effusion or pneumothorax.  Visualized thyroid is unremarkable.  Cardiomegaly.  No pericardial effusion.  No suspicious mediastinal, hilar, or axillary lymphadenopathy.  Right chest dialysis catheter.  Visualized osseous structures are within normal limits.   Review of the MIP images confirms the above findings.  IMPRESSION: Segmental pulmonary embolus within a lingular branch of the left lower lobe pulmonary artery.  Additional subsegmental pulmonary emboli in the right upper, middle, and lower lobes.  Overall clot burden is moderate.  Suspected pulmonary infarct in the lingula.  Pulmonary infarct versus infection with central necrosis in the medial right lung base.  Cardiomegaly with superimposed mild interstitial edema.  Critical Value/emergent results were called by telephone at the time of interpretation on 02/03/2012  at 1740 hours  to  Dr. Hartley Barefoot, who verbally acknowledged these results.  CT ABDOMEN AND PELVIS  Findings: Heterogeneous perfusion of the liver with mild periportal edema.  Spleen, pancreas, and adrenal glands are within normal limits.  Vicarious excretion of contrast in the gallbladder with gallbladder wall thickening/edema in the fundus (series 5/image 35).  Native renal cortical atrophy with numerous bilateral cysts of varying sizes and complexity. Scattered  areas of high density including along the upper pole of the left kidney (series 5/image 18) are favored to reflect interval development of calcifications rather than enhancing lesions.  No hydronephrosis.  No evidence of bowel obstruction.  Normal appendix.  No colonic wall thickening or inflammatory changes.  No evidence of abdominal aortic aneurysm.  Small to moderate abdominopelvic ascites.  Mild retroperitoneal/para-aortic lymph nodes measuring up to 10 mm short axis.  Postsurgical changes in the right lower quadrant from prior/failed renal transplant.  Uterus and bilateral ovaries are unremarkable.  Bladder is underdistended.  No evidence of retroperitoneal hemorrhage.  No evidence of intra- abdominal abscess.  Visualized osseous structures are within normal limits.  Review of the MIP images confirms the above findings.  IMPRESSION: Normal appendix.  No evidence of bowel obstruction.  No colonic wall thickening or inflammatory changes.  Gallbladder wall thickening/edema with mild periportal edema, likely reflecting hypoalbuminemia/overall volume state.  Associated small to moderate abdominopelvic ascites.  If there is clinical concern for acute cholecystitis, RUQ ultrasound can be performed for further evaluation.  Native renal cortical atrophy with numerous bilateral cysts of varying sizes and complexity and suspected calcifications. Postsurgical changes related to prior/failed right lower quadrant renal transplant.  No CT findings to account for the patient's abdominal pain.  Original Report Authenticated By: Charline Bills, M.D.   Ct Abdomen Pelvis W Contrast  02/03/2012  *RADIOLOGY REPORT*  Clinical Data:  Hemoptysis, VQ scan high probability for PE. Abdominal pain, prior abscess, failed renal transplant.  CT ANGIOGRAPHY CHEST CT ABDOMEN AND PELVIS WITH CONTRAST  Technique:  Multidetector CT imaging of the chest was performed using the standard protocol during bolus  administration of intravenous  contrast.  Multiplanar CT image reconstructions including MIPs were obtained to evaluate the vascular anatomy. Multidetector CT imaging of the abdomen and pelvis was performed using the standard protocol during bolus administration of intravenous contrast.  Contrast: 65mL OMNIPAQUE IOHEXOL 350 MG/ML SOLN  Comparison:  VQ scan dated 02/02/2012.  CT abdomen pelvis dated 04/17/2011.  CTA CHEST  Findings:  Segmental pulmonary embolus within a lingular branch of the left lower lobe pulmonary artery (series 6/image 102). Additional subsegmental pulmonary emboli within a branch of the right upper lobe pulmonary artery (series 6/image 85), right middle lobe pulmonary artery (series 6/image 117) and right lower lobe pulmonary artery (series 6/image 135).  Overall clot burden is moderate. No associated findings to suggest right heart strain.  Patchy opacities in the lingula, compatible with pulmonary infarct. Additional patchy opacity with central lucency in the medial right lung base (series 7/image 87), possibly reflecting pulmonary infarct, although infection with central necrosis is not excluded. Superimposed mild diffuse ground glass opacity, likely reflecting interstitial edema.  No pleural effusion or pneumothorax.  Visualized thyroid is unremarkable.  Cardiomegaly.  No pericardial effusion.  No suspicious mediastinal, hilar, or axillary lymphadenopathy.  Right chest dialysis catheter.  Visualized osseous structures are within normal limits.   Review of the MIP images confirms the above findings.  IMPRESSION: Segmental pulmonary embolus within a lingular branch of the left lower lobe pulmonary artery.  Additional subsegmental pulmonary emboli in the right upper, middle, and lower lobes.  Overall clot burden is moderate.  Suspected pulmonary infarct in the lingula.  Pulmonary infarct versus infection with central necrosis in the medial right lung base.  Cardiomegaly with superimposed mild interstitial edema.  Critical  Value/emergent results were called by telephone at the time of interpretation on 02/03/2012  at 1740 hours  to  Dr. Hartley Barefoot, who verbally acknowledged these results.  CT ABDOMEN AND PELVIS  Findings: Heterogeneous perfusion of the liver with mild periportal edema.  Spleen, pancreas, and adrenal glands are within normal limits.  Vicarious excretion of contrast in the gallbladder with gallbladder wall thickening/edema in the fundus (series 5/image 35).  Native renal cortical atrophy with numerous bilateral cysts of varying sizes and complexity. Scattered areas of high density including along the upper pole of the left kidney (series 5/image 18) are favored to reflect interval development of calcifications rather than enhancing lesions.  No hydronephrosis.  No evidence of bowel obstruction.  Normal appendix.  No colonic wall thickening or inflammatory changes.  No evidence of abdominal aortic aneurysm.  Small to moderate abdominopelvic ascites.  Mild retroperitoneal/para-aortic lymph nodes measuring up to 10 mm short axis.  Postsurgical changes in the right lower quadrant from prior/failed renal transplant.  Uterus and bilateral ovaries are unremarkable.  Bladder is underdistended.  No evidence of retroperitoneal hemorrhage.  No evidence of intra- abdominal abscess.  Visualized osseous structures are within normal limits.  Review of the MIP images confirms the above findings.  IMPRESSION: Normal appendix.  No evidence of bowel obstruction.  No colonic wall thickening or inflammatory changes.  Gallbladder wall thickening/edema with mild periportal edema, likely reflecting hypoalbuminemia/overall volume state.  Associated small to moderate abdominopelvic ascites.  If there is clinical concern for acute cholecystitis, RUQ ultrasound can be performed for further evaluation.  Native renal cortical atrophy with numerous bilateral cysts of varying sizes and complexity and suspected calcifications. Postsurgical changes  related to prior/failed right lower quadrant renal transplant.  No CT findings to account for the patient's abdominal  pain.  Original Report Authenticated By: Charline Bills, M.D.    Review of Systems  Constitutional: Negative for fever, chills and weight loss.  Respiratory: Positive for cough, hemoptysis and shortness of breath. Negative for sputum production.   Cardiovascular: Positive for chest pain. Negative for palpitations, orthopnea, claudication, leg swelling and PND.  Gastrointestinal: Positive for abdominal pain. Negative for nausea and vomiting.  Genitourinary: Negative for dysuria and urgency.  Neurological: Negative for dizziness.   Blood pressure 131/93, pulse 101, temperature 97.8 F (36.6 C), temperature source Oral, resp. rate 18, height 5\' 3"  (1.6 m), weight 70 kg (154 lb 5.2 oz), SpO2 100.00%. Physical Exam  Constitutional: She is oriented to person, place, and time. She appears well-developed and well-nourished.  HENT:  Head: Normocephalic and atraumatic.  Eyes: Conjunctivae are normal. No scleral icterus.  Neck: Neck supple. No JVD present. No tracheal deviation present. No thyromegaly present.  Cardiovascular: Normal rate and regular rhythm.        Soft systolic murmur noted no S3 gallop no pericardial rub  Respiratory: Effort normal and breath sounds normal. No respiratory distress. She has no wheezes. She has no rales.  GI: Soft. Bowel sounds are normal. She exhibits no distension. There is tenderness (Right lower quadrant tenderness noted no guarding).  Musculoskeletal: She exhibits no edema and no tenderness.  Lymphadenopathy:    She has no cervical adenopathy.  Neurological: She is alert and oriented to person, place, and time.    Assessment/Plan: Nonischemic cardiomyopathy probably secondary to long-standing hypertension Hypertensive heart disease with systolic dysfunction End-stage renal disease on hemodialysis Acute bilateral pulmonary  embolism/infarction status post hemoptysis Thrombocytopenia rule out heparin-induced thrombocytopenia History of polycystic kidney disease Plan Agree with the present management Will start on carvedilol per orders Will recheck 2-D echo in a few months to see LV systolic function EF remains below 35% felt that her patient for possible ICD implantation Consider workup for hypercoagulable state.  Gennifer Potenza N 02/05/2012, 1:19 PM

## 2012-02-05 NOTE — Consult Note (Signed)
[facility] CONSULT NOTE 02/05/2012 DOB: [id number] MRN : [id number]  CC:AV fistula access clotted, no bruit felt Referring Physician:Kellie A Kathrene Bongo, MD   History of Present Illness: [name] Small is a 21 y.o. female  has a past medical history of Renal failure, s/p post kidney transplant.  She had to have nephrectomy. She has Hypertension and Polycystic kidney disease. She was admitted with bilateral PERight arm AV fistula without bruit since 02-02-2012.  She gets HD every Tuesday Thursday and Saturday. She is currently on dialysis using a right IJ catheter that was placed in [place], [place].  They are treating her with hepin currently.      Past Medical History  Diagnosis Date  . Renal failure   . Hemodialysis patient   . Hypertension   . Polycystic kidney disease     Past Surgical History  Procedure Date  . Nephrectomy   . Av fistula placement   . Kidney transplant 2008    failed     ROS: [x]  Positive  [ ]  Denies    General: [ ]  Weight loss, [ ]  Fever, [ ]  chills Neurologic: [ ]  Dizziness, [ ]  Blackouts, [ ]  Seizure [ ]  Stroke, [ ]  "Mini stroke", [ ]  Slurred speech, [ ]  Temporary blindness; [ ]  weakness in arms or legs, [ ]  Hoarseness Cardiac: [x]  Chest pain/pressure, [x ] Shortness of breath at rest [ ]  Shortness of breath with exertion, [ ]  Atrial fibrillation or irregular heartbeat Vascular: [ ]  Pain in legs with walking, [ ]  Pain in legs at rest, [ ]  Pain in legs at night,  [ ]  Non-healing ulcer, [ ]  Blood clot in vein/DVT,   Pulmonary: [ ]  Home oxygen, [x ] Productive cough, [x ] Coughing up blood, [ ]  Asthma,  [ ]  Wheezing Musculoskeletal:  [ ]  Arthritis, [ ]  Low back pain, [ ]  Joint pain Hematologic: [ ]  Easy Bruising, [ ]  Anemia; [ ]  Hepatitis Gastrointestinal: [ ]  Blood in stool, [ ]  Gastroesophageal Reflux/heartburn, [ ]  Trouble swallowing Urinary: [ ]  chronic Kidney disease, [ ]  on HD - [ ]  MWF or [ ]  TTHS, [ ]  Burning with  urination, [ ]  Difficulty urinating Skin: [ ]  Rashes, [ ]  Wounds Psychological: [ ]  Anxiety, [ ]  Depression  Social History History  Substance Use Topics  . Smoking status: Former Games developer  . Smokeless tobacco: Not on file  . Alcohol Use: No    Family History Family History  Problem Relation Age of Onset  . Polycystic kidney disease Father     Allergies  Allergen Reactions  . Morphine And Related Itching    Current Facility-Administered Medications  Medication Dose Route Frequency Provider Last Rate Last Dose  . 0.9 %  sodium chloride infusion  250 mL Intravenous PRN Therisa Doyne, MD      . acetaminophen (TYLENOL) tablet 650 mg  650 mg Oral Q6H PRN Therisa Doyne, MD       Or  . acetaminophen (TYLENOL) suppository 650 mg  650 mg Rectal Q6H PRN Therisa Doyne, MD      . albuterol (PROVENTIL) (5 MG/ML) 0.5% nebulizer solution 2.5 mg  2.5 mg Nebulization Q2H PRN Therisa Doyne, MD      . alum & mag hydroxide-simeth (MAALOX/MYLANTA) 200-200-20 MG/5ML suspension 30 mL  30 mL Oral Q6H PRN Therisa Doyne, MD      . darbepoetin (ARANESP) injection 40 mcg  40 mcg Intravenous Q Tue-HD Donald Pore, PA      .  diphenhydrAMINE (BENADRYL) injection 12.5 mg  12.5 mg Intravenous Q6H PRN Jinger Neighbors, NP   12.5 mg at 02/05/12 0855  . docusate sodium (COLACE) capsule 100 mg  100 mg Oral BID Therisa Doyne, MD   100 mg at 02/05/12 1016  . enalapril (VASOTEC) tablet 10 mg  10 mg Oral Daily Therisa Doyne, MD   10 mg at 02/05/12 1016  . ferric gluconate (NULECIT) 62.5 mg in sodium chloride 0.9 % 100 mL IVPB  62.5 mg Intravenous Q Thu-HD Donald Pore, PA      . guaiFENesin-dextromethorphan (ROBITUSSIN DM) 100-10 MG/5ML syrup 5 mL  5 mL Oral Q4H PRN Therisa Doyne, MD      . heparin ADULT infusion 100 units/mL (25000 units/250 mL)  1,350 Units/hr Intravenous Continuous Belkys A Regalado, MD 13.5 mL/hr at 02/05/12 1015 1,350 Units/hr at 02/05/12 1015  . hydrALAZINE  (APRESOLINE) tablet 50 mg  50 mg Oral BID WC Donald Pore, PA   50 mg at 02/05/12 4540  . HYDROmorphone (DILAUDID) injection 1 mg  1 mg Intravenous Q4H PRN Belkys A Regalado, MD   1 mg at 02/05/12 0830  . isosorbide dinitrate (ISORDIL) tablet 20 mg  20 mg Oral TID Therisa Doyne, MD   20 mg at 02/05/12 1016  . lanthanum (FOSRENOL) chewable tablet 1,000 mg  1,000 mg Oral TID WC Donald Pore, PA   1,000 mg at 02/05/12 9811  . multivitamin (RENA-VIT) tablet 1 tablet  1 tablet Oral Daily Piedad Climes Cashton, PA   1 tablet at 02/05/12 1016  . ondansetron (ZOFRAN) tablet 4 mg  4 mg Oral Q6H PRN Therisa Doyne, MD       Or  . ondansetron (ZOFRAN) injection 4 mg  4 mg Intravenous Q6H PRN Therisa Doyne, MD   4 mg at 02/04/12 1737  . oxyCODONE (Oxy IR/ROXICODONE) immediate release tablet 5-10 mg  5-10 mg Oral Q4H PRN Jinger Neighbors, NP      . sodium chloride 0.9 % injection 3 mL  3 mL Intravenous Q12H Anastassia Doutova, MD      . sodium chloride 0.9 % injection 3 mL  3 mL Intravenous Q12H Therisa Doyne, MD   3 mL at 02/05/12 1018  . sodium chloride 0.9 % injection 3 mL  3 mL Intravenous PRN Therisa Doyne, MD      . warfarin (COUMADIN) tablet 7.5 mg  7.5 mg Oral ONCE-1800 Wyline Copas, PHARMD   7.5 mg at 02/04/12 1725  . warfarin (COUMADIN) tablet 7.5 mg  7.5 mg Oral ONCE-1800 Belkys A Regalado, MD      . warfarin (COUMADIN) video   Does not apply Once Colleen Can, [facility]      . Warfarin - Pharmacist Dosing Inpatient   Does not apply q1800 Colleen Can, PHARMD      . DISCONTD: heparin ADULT infusion 100 units/mL (25000 units/250 mL)  1,100 Units/hr Intravenous Continuous Colleen Can, PHARMD 11 mL/hr at 02/04/12 1943 1,100 Units/hr at 02/04/12 1943  . DISCONTD: oxyCODONE-acetaminophen (PERCOCET) 5-325 MG per tablet 1-2 tablet  1-2 tablet Oral Q4H PRN Therisa Doyne, MD   2 tablet at 02/03/12 0404      Significant Diagnostic Studies: CBC Lab Results    Component Value Date   WBC 6.7 02/05/2012   HGB 12.3 02/05/2012   HCT 39.2 02/05/2012   MCV 91.4 02/05/2012   PLT 105* 02/05/2012    BMET    Component Value Date/Time   NA 134*  02/05/2012 0610   K 4.2 02/05/2012 0610   CL 96 02/05/2012 0610   CO2 19 02/05/2012 0610   GLUCOSE 78 02/05/2012 0610   BUN 21 02/05/2012 0610   CREATININE 6.52* 02/05/2012 0610   CALCIUM 9.8 02/05/2012 0610   GFRNONAA 8* 02/05/2012 0610   GFRAA 10* 02/05/2012 0610    COAG Lab Results  Component Value Date   INR 1.10 02/05/2012   INR 1.21 02/03/2012   No results found for this basename: PTT     Physical Examination  Patient Vitals for the past 24 hrs:  BP Temp Temp src Pulse Resp SpO2 Weight  02/05/12 0926 131/93 mmHg 97.8 F (36.6 C) Oral 101  18  100 % -  02/05/12 0522 - 97.8 F (36.6 C) Oral - - - -  02/05/12 0500 120/104 mmHg 97.6 F (36.4 C) Oral 58  18  100 % -  02/04/12 2100 149/98 mmHg 97.9 F (36.6 C) Oral 98  20  100 % 154 lb 5.2 oz (70 kg)  02/04/12 1800 128/92 mmHg 97.3 F (36.3 C) Oral 93  20  100 % -  02/04/12 1338 129/89 mmHg 97.8 F (36.6 C) Oral 110  20  100 % -   Pulse Readings from Last 3 Encounters:  02/05/12 101  02/02/12 106  09/28/11 116    General:  WDWN in NAD HENT: WNL Eyes: Pupils equal Pulmonary: normal non-labored breathing , without Rales, rhonchi,  wheezing Cardiac: RRR, without  Murmurs, rubs or gallops; No carotid bruits Abdomen: soft, NT, no masses Skin: no rashes, ulcers noted Vascular Exam/Pulses:Non palpable thrill in the right upper arm fistula, no bruit to auscultation.  Distal radial and ulnar pulses palpated. N/V/M right upper extremity. Extremities without ischemic changes, no Gangrene , no cellulitis; no open wounds;  Musculoskeletal: no muscle wasting or atrophy  Neurologic: A&O X 3; Appropriate Affect ;  SENSATION: normal; MOTOR FUNCTION:  moving all extremities equally.  Speech is fluent/normal  Non-Invasive Vascular  Imaging:  ASSESSMENT:Pending vein mapping PLAN:Will discuss plan with patient and Dr. Myra Gianotti after results of vein mapping. Options will be to do a thrombectomy verses new left arm fistula.

## 2012-02-05 NOTE — Progress Notes (Addendum)
ANTICOAGULATION CONSULT NOTE - Follow Up Consult  Pharmacy Consult for : Coumadin with Heparin bridging Indication: Bilateral Pulmonary embolism  Allergies  Allergen Reactions  . Morphine And Related Itching    Patient Measurements: Height: 5\' 3"  (160 cm) Weight: 154 lb 5.2 oz (70 kg) IBW/kg (Calculated) : 52.4    Vital Signs: Temp: 97.8 F (36.6 C) (04/15 0926) Temp src: Oral (04/15 0926) BP: 131/93 mmHg (04/15 0926) Pulse Rate: 101  (04/15 0926)  Labs:  [name] Small 02/05/12 0610 02/04/12 0625 02/03/12 0820 02/03/12 0600 02/03/12 0039 02/03/12 0016 02/02/12 1736  HGB 12.3 12.5 -- -- -- -- --  HCT 39.2 40.5 37.1 -- -- -- --  PLT 105* 120* 167 -- -- -- --  APTT -- -- -- -- -- 33 --  LABPROT 14.4 -- -- -- -- 15.6* --  INR 1.10 -- -- -- -- 1.21 --  HEPARINUNFRC 0.28* 0.44 0.42 -- -- -- --  CREATININE 6.52* -- -- 7.90* -- -- 6.65*  CKTOTAL -- -- -- -- 52 -- --  CKMB -- -- -- -- 1.3 -- --  TROPONINI -- -- -- -- <0.30 -- --   Estimated Creatinine Clearance: 12.9 ml/min (by C-G formula based on Cr of 6.52).   Medications:  Scheduled:    . darbepoetin (ARANESP) injection - DIALYSIS  40 mcg Intravenous Q Tue-HD  . docusate sodium  100 mg Oral BID  . enalapril  10 mg Oral Daily  . ferric gluconate (FERRLECIT/NULECIT) IV  62.5 mg Intravenous Q Thu-HD  . hydrALAZINE  50 mg Oral BID WC  . isosorbide dinitrate  20 mg Oral TID  . lanthanum  1,000 mg Oral TID WC  . multivitamin  1 tablet Oral Daily  . sodium chloride  3 mL Intravenous Q12H  . sodium chloride  3 mL Intravenous Q12H  . warfarin  7.5 mg Oral ONCE-1800  . warfarin   Does not apply Once  . Warfarin - Pharmacist Dosing Inpatient   Does not apply q1800   Infusions:    . 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   Heparin rate 1100  Units/hr.  Heparin level slightly SUBtherapeutic  0.28 units/ml.  INR  1.1  following one dose of Coumadin.  Goal of Therapy:  INR 2-3 Heparin level 0.3-0.7 units/ml   Plan:   Coumadin 7.5mg  po today.  Increase Heparin infusion to 1350 units/hr.  Next Heparin level in 6 hours.  [name] Small, Elisha Headland, Pharm.D. 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  Argatroban ordered.   Patient with ESRD, on HD.  Plan: Stop Heparin and heparin  Flushes. Hold Coumadin until Platelets >= 150,000 (as per protocol). Stat PTT, HIT antibody screen. Will begin Argatroban @ 1 mcg/kg/min when PTT < 85 sec. After Argatroban started, will check PTT in 2 hours.  [name] Small, Elisha Headland, Pharm.D. 02/05/2012 12:23 PM

## 2012-02-05 NOTE — Progress Notes (Signed)
Utilization Review Completed.[name] Small T4/15/2013   

## 2012-02-05 NOTE — Progress Notes (Addendum)
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 Morphine And Related Itching    Patient Measurements: Height: 5\' 3"  (160 cm) Weight: 154 lb 5.2 oz (70 kg) IBW/kg (Calculated) : 52.4   Labs:  Basename 02/05/12 1652 02/05/12 1226 02/05/12 0610 02/04/12 0625 02/03/12 0820 02/03/12 0600 02/03/12 0039 02/03/12 0016  HGB -- -- 12.3 12.5 -- -- -- --  HCT -- -- 39.2 40.5 37.1 -- -- --  PLT -- -- 105* 120* 167 -- -- --  APTT 62* 72* -- -- -- -- -- 33  LABPROT -- -- 14.4 -- -- -- -- 15.6*  INR -- -- 1.10 -- -- -- -- 1.21  HEPARINUNFRC -- -- 0.28* 0.44 0.42 -- -- --  CREATININE -- -- 6.52* -- -- 7.90* -- --  CKTOTAL -- -- -- -- -- -- 52 --  CKMB -- -- -- -- -- -- 1.3 --  TROPONINI -- -- -- -- -- -- <0.30 --   Estimated Creatinine Clearance: 12.9 ml/min (by C-G formula based on Cr of 6.52).   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 APTT therapeutic (=62 seconds) 2 hours after infusion start.   Goal of Therapy:  APTT 50-90 seconds (1.5-3 x control)   Plan:  - continue argatroban infusion at current rate - recheck aPTT in 2 hours to confirm therapeutic level  Bayard Hugger, PharmD, BCPS, [phone] 02/05/2012,6:06 PM  Addendum:  Repeat aPTT therapeutic (69 seconds) at 1848, will continue argatroban infusion at current rate, and f/u aPTT in AM

## 2012-02-05 NOTE — Progress Notes (Signed)
Subjective:  No current complaints, breathing well. Wondering why RLQ still painful with negative CT  Objective: Vital signs in last 24 hours: Temp:  [97.3 F (36.3 C)-97.9 F (36.6 C)] 97.8 F (36.6 C) (04/15 0522) Pulse Rate:  [58-110] 58  (04/15 0500) Resp:  [18-20] 18  (04/15 0500) BP: (120-149)/(88-104) 120/104 mmHg (04/15 0500) SpO2:  [100 %] 100 % (04/15 0500) Weight:  [70 kg (154 lb 5.2 oz)] 70 kg (154 lb 5.2 oz) (04/14 2100) Weight change: -1 kg (-2 lb 3.3 oz)  Intake/Output from previous day: 04/14 0701 - 04/15 0700 In: 890.4 [P.O.:600; I.V.:290.4] Out: -    EXAM: General appearance:  Alert, in no apparent  Distress concerned about future Resp:  CTA bilaterally Cardio:  RRR without murmur GI:  + BS, soft and nontender Extremities:  No edema Access:  Right IJ catheter, AVF @ RUA without bruit  Lab Results:  Basename 02/05/12 0610 02/04/12 0625  WBC 6.7 7.0  HGB 12.3 12.5  HCT 39.2 40.5  PLT 105* 120*   BMET:  Basename 02/05/12 0610 02/03/12 0600  NA 134* 138  K 4.2 4.0  CL 96 96  CO2 19 22  GLUCOSE 78 101*  BUN 21 23  CREATININE 6.52* 7.90*  CALCIUM 9.8 10.3  ALBUMIN 3.1* 3.3*   No results found for this basename: PTH:2 in the last 72 hours Iron Studies: No results found for this basename: IRON,TIBC,TRANSFERRIN,FERRITIN in the last 72 hours  Assessment/Plan: 1.  Bilateral pulmonary embolism - Heparin/Coumadin bridge; AVF clotted, evaluated by Dr. Lowella Dandy of IR on 4/12 and determined not to be a candidate for thrombolysis, secondary to the extent of clot per [place]; question whether PE is related.  VVS to evaluate to determine if AVF is salvageable. 2.  ESRD - on HD on TTS at [facility]. K 4.2 stable s/p HD on 4/13.  Next HD tomorrow. 3.  Anemia - Hgb 12.3 on Araensp 40 mcg on Tues, on outpatient Epo 20,000 U (held in hospital), weekly Fe on Thurs. 4.  Secondary hyperparathyroidism - Ca 9.8 (10 corrected), P 3.7 on Fosrenol 1 g with meals, off Zemplar. 5.  HTN/Volume  - Last BP 120/104, Hydralazine increased to 50 mg bid, also Enalapril 10 mg qd; wt 70 kg yesterday (outpatient EDW 77). 6.  Diarrhea - C. Diff negative 4/14; improving. 7.  Left abdominal pain - CT negative. Probably just residual from work done in that area 8.  Access - AVF @ RUA clotted, to be evaluated by VVS; also with right IJ catheter. 9.  S/p failed transplant - transplant in [place] in 07/2007, failed in 08/2011 (secondary to missed Prograf), nephrectomy 09/15/11.     LOS: 3 days   LYLES,CHARLES 02/05/2012,8:00 AM  Patient seen and examined, agree with above note with above modifications.  Annie Sable, MD 02/05/2012

## 2012-02-06 ENCOUNTER — Inpatient Hospital Stay (HOSPITAL_COMMUNITY): Payer: Managed Care, Other (non HMO)

## 2012-02-06 LAB — RENAL FUNCTION PANEL
Albumin: 2.8 g/dL — ABNORMAL LOW (ref 3.5–5.2)
Chloride: 97 mEq/L (ref 96–112)
GFR calc Af Amer: 7 mL/min — ABNORMAL LOW (ref >90)
Glucose, Bld: 92 mg/dL (ref 70–99)
Phosphorus: 3.7 mg/dL (ref 2.3–4.6)
Potassium: 3.4 mEq/L — ABNORMAL LOW (ref 3.5–5.1)
Sodium: 136 mEq/L (ref 135–145)

## 2012-02-06 LAB — CBC
Hemoglobin: 11.2 g/dL — ABNORMAL LOW (ref 12.0–15.0)
MCH: 27.9 pg (ref 26.0–34.0)
MCHC: 30.9 g/dL (ref 30.0–36.0)
Platelets: 128 10*3/uL — ABNORMAL LOW (ref 150–400)
RDW: 20.1 % — ABNORMAL HIGH (ref 11.5–15.5)

## 2012-02-06 LAB — ANA: Anti Nuclear Antibody(ANA): NEGATIVE

## 2012-02-06 LAB — PROTIME-INR: Prothrombin Time: 24.8 seconds — ABNORMAL HIGH (ref 11.6–15.2)

## 2012-02-06 LAB — HEMOGLOBIN AND HEMATOCRIT, BLOOD: HCT: 40.7 % (ref 36.0–46.0)

## 2012-02-06 MED ORDER — CALCIUM ACETATE 667 MG PO CAPS
1334.0000 mg | ORAL_CAPSULE | Freq: Three times a day (TID) | ORAL | Status: DC
  Administered 2012-02-06 – 2012-02-14 (×22): 1334 mg via ORAL
  Filled 2012-02-06 (×27): qty 2

## 2012-02-06 MED ORDER — DEXTROSE 5 % IV SOLN
1.5000 g | INTRAVENOUS | Status: AC
  Filled 2012-02-06: qty 1.5

## 2012-02-06 MED ORDER — PANTOPRAZOLE SODIUM 40 MG IV SOLR
40.0000 mg | Freq: Two times a day (BID) | INTRAVENOUS | Status: DC
  Administered 2012-02-06 – 2012-02-11 (×11): 40 mg via INTRAVENOUS
  Filled 2012-02-06 (×13): qty 40

## 2012-02-06 MED ORDER — DIPHENHYDRAMINE HCL 50 MG/ML IJ SOLN
12.5000 mg | Freq: Once | INTRAMUSCULAR | Status: AC
  Administered 2012-02-06: 12.5 mg via INTRAVENOUS

## 2012-02-06 MED ORDER — DARBEPOETIN ALFA-POLYSORBATE 40 MCG/0.4ML IJ SOLN
INTRAMUSCULAR | Status: AC
  Filled 2012-02-06: qty 0.4

## 2012-02-06 MED ORDER — HYDROMORPHONE HCL PF 1 MG/ML IJ SOLN
INTRAMUSCULAR | Status: AC
  Filled 2012-02-06: qty 1

## 2012-02-06 NOTE — Progress Notes (Signed)
Pt says that she likes her bed elevated in the air. I reinforced to the patient that she must make sure that the bed is at its lowest before she gets out of bed. Pt agreed that she would, pt is A/Ox4. Will continue to monitor.

## 2012-02-06 NOTE — Progress Notes (Signed)
Off unit.

## 2012-02-06 NOTE — Progress Notes (Signed)
ANTICOAGULATION CONSULT NOTE - Follow Up Consult  Pharmacy Consult for : Argatroban, Coumadin Indication: B-PE's with suspected HIT  Allergies  Allergen Reactions  . Morphine And Related Itching    Patient Measurements: Height: 5\' 3"  (160 cm) Weight: 156 lb 8.4 oz (71 kg) IBW/kg (Calculated) : 52.4    Vital Signs: Temp: 97.7 F (36.5 C) (04/16 1041) Temp src: Oral (04/16 1041) BP: 142/76 mmHg (04/16 1041) Pulse Rate: 85  (04/16 1041)  Labs:  Basename 02/06/12 0730 02/05/12 1848 02/05/12 1652 02/05/12 0610 02/04/12 0625  HGB 11.2* -- -- 12.3 --  HCT 36.2 -- -- 39.2 40.5  PLT 128* -- -- 105* 120*  APTT 67* 69* 62* -- --  LABPROT 24.8* -- -- 14.4 --  INR 2.20* -- -- 1.10 --  HEPARINUNFRC -- -- -- 0.28* 0.44  CREATININE 8.48* -- -- 6.52* --  CKTOTAL -- -- -- -- --  CKMB -- -- -- -- --  TROPONINI -- -- -- -- --   Estimated Creatinine Clearance: 10 ml/min (by C-G formula based on Cr of 8.48).   Medications:     carvedilol 6.25 mg Oral BID WC  darbepoetin (ARANESP) injection - DIALYSIS 40 mcg Intravenous Q Tue-HD  diphenhydrAMINE 12.5 mg Intravenous Once  docusate sodium 100 mg Oral BID  enalapril 10 mg Oral Daily  ferric gluconate (FERRLECIT/NULECIT) IV 62.5 mg Intravenous Q Thu-HD  hydrALAZINE 50 mg Oral BID WC  isosorbide dinitrate 20 mg Oral TID  lanthanum 1,000 mg Oral TID WC  multivitamin 1 tablet Oral Daily  pantoprazole (PROTONIX) IV 40 mg Intravenous Q12H  sodium chloride 3 mL Intravenous Q12H  sodium chloride 3 mL Intravenous Q12H  warfarin  Does not apply Once  Warfarin - Pharmacist Dosing Inpatient  Does not apply q1800  DISCONTD: warfarin 7.5 mg Oral ONCE-1800      argatroban infusion Last Rate: 1 mcg/kg/min (02/05/12 1405)    Assessment:  Patient is an ESRD patient on chronic HD started on anticoagulation for B-PE's.  Heparin changed to Argatroban for suspected HIT.  Currently infusing at 1 mcg/kg/min.  PTT therapeutic  67 secs  (50-90  secs)  Some reported hemoptysis this AM, however the patient had hemoptysis prior to any anticoagulation.   Last hgb 11.2.  INR  1.1 >> 2.2 since start of Argatroban.  Argatroban when given with Coumadin, produces a combined effect on INR  WITHOUT affecting Vitamin-K-dependent factor Xa activity.  Therefore, the INR does not reflect a therapeutic endpoint.  Goal of Therapy:   aPTT 50 - 90 seconds.  Resumption of Coumadin when platelet count is > 150,000.   Plan:   Continue Argatroban at 1 mcg/kg/min.  Hold Coumadin as above.  Joakim Huesman, Elisha Headland, Pharm.D. 02/06/2012 11:05 AM

## 2012-02-06 NOTE — Progress Notes (Signed)
Spoke with Dr.Regalado about pt coughing up blood. MD aware and said she was going to put in labs. Will continue to monitor.

## 2012-02-06 NOTE — Progress Notes (Addendum)
Subjective: Feeling ok, coughing small amount of blood this morning, burning sensation chest.  Objective: Filed Vitals:   02/06/12 0642 02/06/12 0700 02/06/12 0730 02/06/12 0800  BP: 136/85 170/91 138/84 134/83  Pulse:  84 82 84  Temp:      TempSrc:      Resp:      Height:      Weight:      SpO2:       Weight change:   Intake/Output Summary (Last 24 hours) at 02/06/12 0804 Last data filed at 02/06/12 0500  Gross per 24 hour  Intake    780 ml  Output      0 ml  Net    780 ml    General: Alert, awake, oriented x3, in no acute distress.  HEENT: No bruits, no goiter.  Heart: Regular rate and rhythm, without murmurs, rubs, gallops.  Lungs: CTA, bilateral air movement.  Abdomen: Soft, nontender, nondistended, positive bowel sounds.  Neuro: Grossly intact, nonfocal. Extremities: no edema.   Lab Results:  [facility] 02/05/12 0610  NA 134*  K 4.2  CL 96  CO2 19  GLUCOSE 78  BUN 21  CREATININE 6.52*  CALCIUM 9.8  MG --  PHOS 3.7    Basename 02/05/12 0610  AST --  ALT --  ALKPHOS --  BILITOT --  PROT --  ALBUMIN 3.1*    Basename 02/05/12 0610 02/04/12 0625  WBC 6.7 7.0  NEUTROABS -- --  HGB 12.3 12.5  HCT 39.2 40.5  MCV 91.4 92.7  PLT 105* 120*    Basename 02/04/12 0625  TSH --  T4TOTAL --  T3FREE 2.7  THYROIDAB --    Micro Results: Recent Results (from the past 240 hour(s))  MRSA PCR SCREENING     Status: Normal   Collection Time   02/03/12  3:57 AM      Component Value Range Status Comment   MRSA by PCR NEGATIVE  NEGATIVE  Final   CLOSTRIDIUM DIFFICILE BY PCR     Status: Normal   Collection Time   02/04/12  8:56 AM      Component Value Range Status Comment   C difficile by pcr NEGATIVE  NEGATIVE  Final     Studies/Results: No results found.  Medications: I have reviewed the patient's current medications. 21 year old with PMH ESRD,Polycystic kidney disease, renal transplant failed admitted with chest pain and hemoptysis found to have PE  diagnosed by V-Q scan, ct chest ordered to evaluate for pulmonary hemorrhage, infarct.  She also has chronic abdominal pain.    Pulmonary Embolism:  CT show PE, pulmonary infarct would explain hemoptysis. Platelet decrease more than 50 %.  HIT panel pending.  Renal recommend to use argatroban. Continue  anticoagulation per pharmacy protocol.  Heparin stopped 4-15.  Monitor hemoptysis advised patient to inform us if get worse. Hypercoagulable panel: Anticardiolipin titer low, Antithrombin 66, Protein C 116, Protein S 127, panel will need to be repeated when she is off anticoagulation. Platelet pending. Hold coumadin until platelet increases. Pharmacy will adjust anticoagulation.   Cardiomyopathy: ECHO show EF 20 %. Continue lisinopril. Patient was started on coreg. ESR 5, ANA, DNA double strand. Appreciate cardio help. Needs ECHO in few months to reassess EF.   Increase TSH: Free T3 normal. Free T4 mild elevated. Active form is normal. She will need repeat thyroid function test in 4 weeks.   ESRD (end stage renal disease) (02/03/2012) Renal following.   HTN;  Hydralazine and lisinopril.  Diarrhea;  C diff negative.  Improved.  Left side lower quadrant Abdominal pain: CT negative for acute finding. Needs to follow up with PCP.        LOS: 4 days   Doneen Ollinger M.D.  [facility] 02/06/2012, 8:04 AM

## 2012-02-06 NOTE — Procedures (Signed)
Patient was seen on dialysis and the procedure was supervised.  BFR 400  Via PC BP is  130/77.   Patient appears to be tolerating treatment well  [name] Small A 02/06/2012

## 2012-02-06 NOTE — Progress Notes (Signed)
Subjective:  Seems flat, c/o mild heartburn.  Want to go off renal diet just for today.  Now on argatroban for concern over hit.  It seems that cardiomyopathy was discovered at [facility] during transplant work up  Objective: Vital signs in last 24 hours: Temp:  [97.7 F (36.5 C)-98.6 F (37 C)] 98.6 F (37 C) (04/16 0637) Pulse Rate:  [78-104] 78  (04/16 0830) Resp:  [17-18] 17  (04/16 0637) BP: (105-170)/(65-93) 134/83 mmHg (04/16 0830) SpO2:  [96 %-100 %] 99 % (04/16 0637) Weight change:   Intake/Output from previous day: 04/15 0701 - 04/16 0700 In: 780 [P.O.:780] Out: -    EXAM: General appearance:  Alert, in no apparent  Distress Resp:  CTA bilaterally Cardio:  RRR without murmur GI:  + BS, soft and nontender Extremities:  No edema Access:  Right IJ catheter, AVF @ RUA without bruit  Lab Results:  [facility] 02/05/12 0610 02/04/12 0625  WBC 6.7 7.0  HGB 12.3 12.5  HCT 39.2 40.5  PLT 105* 120*   BMET:   Basename 02/06/12 0730 02/05/12 0610  NA 136 134*  K 3.4* 4.2  CL 97 96  CO2 25 19  GLUCOSE 92 78  BUN 31* 21  CREATININE 8.48* 6.52*  CALCIUM 9.5 9.8  ALBUMIN 2.8* 3.1*   No results found for this basename: PTH:2 in the last 72 hours Iron Studies: No results found for this basename: IRON,TIBC,TRANSFERRIN,FERRITIN in the last 72 hours  Assessment/Plan: 1.  Bilateral pulmonary embolism - argatroban (for possible HIT) /Coumadin bridge,  secondary to the extent of clot in AVF?   question whether PE is related.  VVS to evaluate to determine if AVF is salvageable. Appreciate their assist.  Awaiting INR therapeutic for D/C 2.  ESRD - on HD on TTS at [facility], now via PC. Seen on HD today 3.  Anemia - Hgb 12.3 on Araensp 40 mcg on Tues, on outpatient Epo 20,000 U (held in hospital), weekly Fe on Thurs. 4.  Secondary hyperparathyroidism - Ca 9.8 (10 corrected), P 3.7 on Fosrenol 1 g with meals, off Zemplar. 5.  HTN/Volume -  BP improved today after med changes 6.  Diarrhea - C.  Diff negative 4/14; improving. 7.  Left abdominal pain - CT negative.  8.  Access - AVF @ RUA clotted, to be evaluated by VVS; also with right IJ catheter. 9.  S/p failed transplant - transplant in [place] in 07/2007, failed in 08/2011 (secondary to missed Prograf), nephrectomy 09/15/11. 10. Cardiomyopathy- known and likely due to longstanding hypertension, medical management  Dispo- Need coumadin to be therapeutic but also plan and possible placement of access prior to D/C, could be somewhat contradictory.     LOS: 4 days   [name] Small A 02/06/2012,8:45 AM

## 2012-02-06 NOTE — Progress Notes (Signed)
I noticed tissues with bloody sputum at pt's bedside. Pt said that she has been coughing up blood but today she's been coughing up a little more than yesterday. MD notified via text page awaiting response. Will continue to monitor.

## 2012-02-07 ENCOUNTER — Inpatient Hospital Stay (HOSPITAL_COMMUNITY): Payer: Managed Care, Other (non HMO)

## 2012-02-07 DIAGNOSIS — J189 Pneumonia, unspecified organism: Secondary | ICD-10-CM

## 2012-02-07 DIAGNOSIS — I2699 Other pulmonary embolism without acute cor pulmonale: Principal | ICD-10-CM

## 2012-02-07 LAB — CARDIOLIPIN ANTIBODIES, IGG, IGM, IGA
Anticardiolipin IgA: 2 APL U/mL — ABNORMAL LOW (ref <22)
Anticardiolipin IgM: 2 MPL U/mL — ABNORMAL LOW (ref <11)

## 2012-02-07 LAB — HEPARIN INDUCED THROMBOCYTOPENIA PNL
UFH High Dose UFH H: 0 % Release
UFH Low Dose 0.1 IU/mL: 0 % Release
UFH Low Dose 0.5 IU/mL: 0 % Release
UFH SRA Result: NEGATIVE

## 2012-02-07 LAB — PROTIME-INR: INR: 1.83 — ABNORMAL HIGH (ref 0.00–1.49)

## 2012-02-07 MED ORDER — NYSTATIN 100000 UNIT/ML MT SUSP
10.0000 mL | Freq: Four times a day (QID) | OROMUCOSAL | Status: DC
  Administered 2012-02-07 – 2012-02-11 (×16): 1000000 [IU] via ORAL
  Filled 2012-02-07 (×30): qty 10

## 2012-02-07 MED ORDER — GUAIFENESIN-CODEINE 100-10 MG/5ML PO SOLN: 5.0000 mL | Freq: Four times a day (QID) | ORAL | Status: DC | PRN

## 2012-02-07 NOTE — Progress Notes (Signed)
Subjective: Wants off renal diet Still having hemoptysis No CP, no sob   Objective: Vital signs in last 24 hours: Filed Vitals:   02/06/12 2013 02/07/12 0538 02/07/12 0848 02/07/12 0950  BP: 119/82 108/72 163/90 103/66  Pulse: 56 88 90 104  Temp: 98.7 F (37.1 C) 97.9 F (36.6 C)  98.8 F (37.1 C)  TempSrc: Oral Oral  Oral  Resp: 18 18  20   Height:      Weight: 71.85 kg (158 lb 6.4 oz)     SpO2: 97% 100%  98%   Weight change:   Intake/Output Summary (Last 24 hours) at 02/07/12 1125 Last data filed at 02/07/12 0900  Gross per 24 hour  Intake    443 ml  Output      0 ml  Net    443 ml    Physical Exam: General: Awake, Oriented, No acute distress. HEENT: EOMI. Neck: Supple CV: S1 and S2, rrr Lungs: Clear to ascultation bilaterally, no wheezing Abdomen: Soft, Nontender, Nondistended, +bowel sounds. Ext: Good pulses. Trace edema.   Lab Results:  [facility] 02/06/12 0730 02/05/12 0610  NA 136 134*  K 3.4* 4.2  CL 97 96  CO2 25 19  GLUCOSE 92 78  BUN 31* 21  CREATININE 8.48* 6.52*  CALCIUM 9.5 9.8  MG -- --  PHOS 3.7 3.7    Basename 02/06/12 0730 02/05/12 0610  AST -- --  ALT -- --  ALKPHOS -- --  BILITOT -- --  PROT -- --  ALBUMIN 2.8* 3.1*   No results found for this basename: LIPASE:2,AMYLASE:2 in the last 72 hours  Basename 02/07/12 0700 02/06/12 1546 02/06/12 0730  WBC 6.7 -- 6.6  NEUTROABS -- -- --  HGB 10.7* 12.9 --  HCT 35.3* 40.7 --  MCV 94.4 -- 90.0  PLT 70* -- 128*   No results found for this basename: CKTOTAL:3,CKMB:3,CKMBINDEX:3,TROPONINI:3 in the last 72 hours No components found with this basename: POCBNP:3 No results found for this basename: DDIMER:2 in the last 72 hours No results found for this basename: HGBA1C:2 in the last 72 hours No results found for this basename: CHOL:2,HDL:2,LDLCALC:2,TRIG:2,CHOLHDL:2,LDLDIRECT:2 in the last 72 hours No results found for this basename: TSH,T4TOTAL,FREET3,T3FREE,THYROIDAB in the last 72  hours No results found for this basename: VITAMINB12:2,FOLATE:2,FERRITIN:2,TIBC:2,IRON:2,RETICCTPCT:2 in the last 72 hours  Micro Results: Recent Results (from the past 240 hour(s))  MRSA PCR SCREENING     Status: Normal   Collection Time   02/03/12  3:57 AM      Component Value Range Status Comment   MRSA by PCR NEGATIVE  NEGATIVE  Final   CLOSTRIDIUM DIFFICILE BY PCR     Status: Normal   Collection Time   02/04/12  8:56 AM      Component Value Range Status Comment   C difficile by pcr NEGATIVE  NEGATIVE  Final     Studies/Results: No results found.  Medications: I have reviewed the patient's current medications. Scheduled Meds:   . calcium acetate  1,334 mg Oral TID WC  . carvedilol  6.25 mg Oral BID WC  . cefUROXime (ZINACEF)  IV  1.5 g Intravenous On Call to OR  . darbepoetin (ARANESP) injection - DIALYSIS  40 mcg Intravenous Q Tue-HD  . docusate sodium  100 mg Oral BID  . enalapril  10 mg Oral Daily  . ferric gluconate (FERRLECIT/NULECIT) IV  62.5 mg Intravenous Q Thu-HD  . hydrALAZINE  50 mg Oral BID WC  . isosorbide dinitrate  20  mg Oral TID  . multivitamin  1 tablet Oral Daily  . pantoprazole (PROTONIX) IV  40 mg Intravenous Q12H  . sodium chloride  3 mL Intravenous Q12H  . warfarin   Does not apply Once  . Warfarin - Pharmacist Dosing Inpatient   Does not apply q1800  . DISCONTD: lanthanum  1,000 mg Oral TID WC  . DISCONTD: sodium chloride  3 mL Intravenous Q12H   Continuous Infusions:   . argatroban infusion 1 mcg/kg/min (02/07/12 1033)   PRN Meds:.sodium chloride, acetaminophen, acetaminophen, albuterol, alum & mag hydroxide-simeth, diphenhydrAMINE, guaiFENesin-dextromethorphan, HYDROmorphone, ondansetron (ZOFRAN) IV, ondansetron, oxyCODONE, sodium chloride  Assessment/Plan: 21 year old with PMH ESRD,Polycystic kidney disease, renal transplant failed admitted with chest pain and hemoptysis found to have PE diagnosed by V-Q scan, ct chest ordered to evaluate for  pulmonary hemorrhage, infarct. She also has chronic abdominal pain.   Pulmonary Embolism:  CT show PE, pulmonary infarct would explain hemoptysis. Platelet decrease more than 50 %. HIT panel pending. Renal recommend to use argatroban. Continue anticoagulation per pharmacy protocol. Heparin stopped 4-15- start coumadin once Plt > 150 and once INR therapeutic plan to D/C patient home and return for vascular to address her access Hypercoagulable panel: Anticardiolipin titer low, Antithrombin 66, Protein C 116, Protein S 127, panel will need to be repeated when she is off anticoagulation. Platelet pending. Hold coumadin until platelet increases. Pharmacy will adjust anticoagulation.   Cardiomyopathy: ECHO show EF 20 %. Continue lisinopril. Patient was started on coreg. ESR 5, ANA, DNA double strand. Appreciate cardio help. Needs ECHO in few months to reassess EF.   Increase TSH: Free T3 normal. Free T4 mild elevated. Active form is normal. She will need repeat thyroid function test in 4 weeks.   ESRD (end stage renal disease) (02/03/2012) Renal following.   HTN;  Hydralazine and lisinopril.   Diarrhea;  C diff negative.  Improved.   Left side lower quadrant Abdominal pain: CT negative for acute finding. Needs to follow up with PCP.   Hemoptysis- more than yesterday, bright red with clots- will ask pulm to follow, most likely related to PE with infarction.    LOS: 5 days  Edit Ricciardelli, DO 02/07/2012, 11:25 AM

## 2012-02-07 NOTE — Consult Note (Signed)
Patient: [name] [name] DOB: 29-Dec-1990 Date of Admission: 02/02/2012            Pulmonary consult  Date of Consult: 02/07/2012 MD requesting consult:  Dr. Benjamine Mola ([facility]) Reason for consult:  hemoptysis  HPI - 21yo female with hx ESRD on HD r/t polycystic kidney disease who presented 4/12 with c/o chest pain and hemoptysis.  She was dx with PE and and admitted by [facility].  She was initially placed on heparin but developed thrombocytopenia and ?HIT and was changed to Argatroban.  She cont to have mod amount hemoptysis and PCCM consulted.   Allergies:  Allergies  Allergen Reactions  . Morphine And Related Itching     PMH: Past Medical History  Diagnosis Date  . Renal failure   . Hemodialysis patient   . Hypertension   . Polycystic kidney disease     Home meds: Prior to Admission medications   Medication  Sig  Start Date  End Date  Taking?  Authorizing Provider   calcium acetate (PHOSLO) 667 MG capsule  Take 667 mg by mouth 3 (three) times daily with meals.    Yes  Historical Provider, MD   enalapril (VASOTEC) 10 MG tablet  Take 10 mg by mouth daily.    Yes  Historical Provider, MD   hydrALAZINE (APRESOLINE) 25 MG tablet  Take 25 mg by mouth 2 (two) times daily with a meal.    Yes  Historical Provider, MD   isosorbide dinitrate (ISORDIL) 20 MG tablet  Take 20 mg by mouth 3 (three) times daily.    Yes  Historical Provider, MD   oxyCODONE-acetaminophen (PERCOCET) 5-325 MG per tablet  Take 1-2 tablets by mouth every 4 (four) hours as needed. For pain    Yes  Historical Provider, MD   promethazine (PHENERGAN) 25 MG tablet  Take 25 mg by mouth every 6 (six) hours as needed.    Yes  Historical Provider, MD    Social Hx: History   Social History  . Marital Status: Single    Spouse Name: N/A    Number of Children: N/A  . Years of Education: N/A   Occupational History  . Not on file.   Social History Main Topics  . Smoking status: Former Games developer  . Smokeless tobacco: Not on file  .  Alcohol Use: No  . Drug Use: No  . Sexually Active:    Other Topics Concern  . Not on file   Social History Narrative  . No narrative on file     Family Hx: Family History  Problem Relation Age of Onset  . Polycystic kidney disease Father      ROS:   Filed Vitals:   02/06/12 2013 02/07/12 0538 02/07/12 0848 02/07/12 0950  BP: 119/82 108/72 163/90 103/66  Pulse: 56 88 90 104  Temp: 98.7 F (37.1 C) 97.9 F (36.6 C)  98.8 F (37.1 C)  TempSrc: Oral Oral  Oral  Resp: 18 18  20   Height:      Weight: 158 lb 6.4 oz (71.85 kg)     SpO2: 97% 100%  98%    chest X-ray No results found.  CBC    Component Value Date/Time   WBC 6.7 02/07/2012 0700   RBC 3.74* 02/07/2012 0700   HGB 10.7* 02/07/2012 0700   HCT 35.3* 02/07/2012 0700   PLT 70* 02/07/2012 0700   MCV 94.4 02/07/2012 0700   MCH 28.6 02/07/2012 0700   MCHC 30.3 02/07/2012 0700   RDW  20.8* 02/07/2012 0700   LYMPHSABS 2.3 02/02/2012 1736   MONOABS 0.5 02/02/2012 1736   EOSABS 0.0 02/02/2012 1736   BASOSABS 0.0 02/02/2012 1736   BMET    Component Value Date/Time   NA 136 02/06/2012 0730   K 3.4* 02/06/2012 0730   CL 97 02/06/2012 0730   CO2 25 02/06/2012 0730   GLUCOSE 92 02/06/2012 0730   BUN 31* 02/06/2012 0730   CREATININE 8.48* 02/06/2012 0730   CALCIUM 9.5 02/06/2012 0730   GFRNONAA 6* 02/06/2012 0730   GFRAA 7* 02/06/2012 0730   EXAM: General: chronically ill appearing young [facility]. Neuro:Alert and interactive, moving all ext to command. CV: RRR, Nl S1/S2, -M/R/G. PULM: CTA bilaterally. GI: Soft, NT, ND and +BS. Extremities: -edema and -tenderness.   IMPRESSION/ PLAN:  1. Hemoptysis  -- In setting PE/pulm infarct on anticoagulation with thrombocytopenia. ??HIT (panel pending).  Also with ?RLL cavity  Lab 02/07/12 0700 02/06/12 1546 02/06/12 0730 02/05/12 0610 02/04/12 0625  HGB 10.7* 12.9 11.2* 12.3 12.5    Lab 02/07/12 0700 02/06/12 0730 02/05/12 0610 02/04/12 0625 02/03/12 0820  PLT 70* 128* 105* 120*  167  PLAN -   Repeat CT to evaluate the cavity at the RLL size and progression.  Continue anticoagulation for now.  Control cough, hopefully hemoptysis is due to airway irritation from repeated coughing, if that does not control hemoptysis in the next couple of days then will need to consider bronchoscopy and d/c of anticoagulation then placement of filter.  Lower ext dopplers to r/o DVT.  No filter for now.  2. PE --  Mod clot burden.  See discussion above.  Other problems per [facility]/renal: ESRD, HTN, Diarrhea, cardiomyopathy  WHITEHEART,KATHRYN, NP 02/07/2012  1:33 PM Pager: [phone]  *Care during the described time interval was provided by me and/or other providers on the critical care team. I have reviewed this patient's available data, including medical history, events of note, physical examination and test results as part of my evaluation.  Patient seen and examined, agree with above note.  I dictated the care and orders written for this patient under my direction.  Koren Bound, M.D. [phone]

## 2012-02-07 NOTE — Progress Notes (Signed)
Subjective:  Sitting up in bed, eating breakfast, no current complaints.  Objective: Vital signs in last 24 hours: Temp:  [97.7 F (36.5 C)-98.9 F (37.2 C)] 97.9 F (36.6 C) (04/17 0538) Pulse Rate:  [56-120] 90  (04/17 0848) Resp:  [16-18] 18  (04/17 0538) BP: (108-163)/(68-90) 163/90 mmHg (04/17 0848) SpO2:  [92 %-100 %] 100 % (04/17 0538) Weight:  [71 kg (156 lb 8.4 oz)-71.85 kg (158 lb 6.4 oz)] 71.85 kg (158 lb 6.4 oz) (04/16 2013) Weight change:   Intake/Output from previous day: 04/16 0701 - 04/17 0700 In: 203 [P.O.:200; I.V.:3] Out: 3000    EXAM: General appearance:  Alert, in no apparent distress Resp:  CTA bilaterally  Cardio:  RRR without murmur GI:  + BS, soft and nontender Extremities:  No edema Access:  Right IJ catheter, clotted AVF @ RUA   Lab Results:  Basename 02/07/12 0700 02/06/12 1546 02/06/12 0730  WBC PENDING -- 6.6  HGB 10.7* 12.9 --  HCT 35.3* 40.7 --  PLT PENDING -- 128*   BMET:  Basename 02/06/12 0730 02/05/12 0610  NA 136 134*  K 3.4* 4.2  CL 97 96  CO2 25 19  GLUCOSE 92 78  BUN 31* 21  CREATININE 8.48* 6.52*  CALCIUM 9.5 9.8  ALBUMIN 2.8* 3.1*   No results found for this basename: PTH:2 in the last 72 hours Iron Studies: No results found for this basename: IRON,TIBC,TRANSFERRIN,FERRITIN in the last 72 hours  Assessment/Plan: 1.  Bilateral pulmonary embolism - argatroban (for possible HIT) HIT assay is pending /Coumadin bridge; IR unable to declot AVF secondary to extensive clot, question whether PE is related. [facility] to evaluate to determine if AVF is salvageable they say no . Appreciate their assist. Awaiting INR therapeutic for D/C.  2.  ESRD - on HD on TTS at [facility], now via PC; pre-HD K 3.4 yesterday.  Next HD tomorrow.  3.  Anemia - Hgb 10.7 on Araensp 40 mcg on Tues (on outpatient Epo 20,000 U), weekly Fe on Thurs.  4.  Secondary hyperparathyroidism - Ca 9.8 (10 corrected), P 3.7 on Fosrenol 1 g with meals, off Zemplar.  5.   HTN/Volume - BP improved yesterday after med changes  6.  Diarrhea - C. Diff negative 4/14; improving.  7.  Left abdominal pain - CT negative.  8.  Access - AVF @ RUA clotted, to be evaluated by [facility]VS; also with right IJ catheter.  9.  S/p failed transplant - transplant in [place] in 07/2007, failed in 08/2011 (secondary to missed Prograf), nephrectomy 09/15/11.  10.Cardiomyopathy- known and likely due to longstanding hypertension, medical management  11.Dispo- Need coumadin to be therapeutic but also plan and possible placement of access prior to D/C, could be somewhat contradictory. Spoke to [facility].  They feel like it may be better to give the patient a couple of weeks on coumadin to let the clot epithelialize, then patient will need to come back to hospital for heparin bridge to get new access placed. So, I guess is ready for d/c once INR is therapeutic unless the hospitalists have other plans for the patient.      LOS: 5 days   [name],[name] 02/07/2012,8:54 AM  Patient seen and examined, agree with above note with above modifications.  Annie Sable, MD 02/07/2012

## 2012-02-07 NOTE — Progress Notes (Addendum)
ANTICOAGULATION CONSULT NOTE - Follow Up Consult  Pharmacy Consult for : Coumadin with Argatroban bridging Indication: PE with suspected HIT  Allergies  Allergen Reactions  . Morphine And Related Itching    Patient Measurements: Height: 5\' 3"  (160 cm) Weight: 158 lb 6.4 oz (71.85 kg) IBW/kg (Calculated) : 52.4    Vital Signs: Temp: 98.8 F (37.1 C) (04/17 0950) Temp src: Oral (04/17 0950) BP: 103/66 mmHg (04/17 0950) Pulse Rate: 104  (04/17 0950)  Labs:  Basename 02/07/12 0700 02/06/12 1546 02/06/12 0730 02/05/12 1848 02/05/12 0610  HGB 10.7* 12.9 -- -- --  HCT 35.3* 40.7 36.2 -- --  PLT 70* -- 128* -- 105*  APTT 68* -- 67* 69* --  LABPROT 21.5* -- 24.8* -- 14.4  INR 1.83* -- 2.20* -- 1.10  HEPARINUNFRC -- -- -- -- 0.28*  CREATININE -- -- 8.48* -- 6.52*  CKTOTAL -- -- -- -- --  CKMB -- -- -- -- --  TROPONINI -- -- -- -- --   Estimated Creatinine Clearance: 10.1 ml/min (by C-G formula based on Cr of 8.48).   Medications:  Scheduled:    . calcium acetate  1,334 mg Oral TID WC  . carvedilol  6.25 mg Oral BID WC  . cefUROXime (ZINACEF)  IV  1.5 g Intravenous On Call to OR  . darbepoetin (ARANESP) injection - DIALYSIS  40 mcg Intravenous Q Tue-HD  . docusate sodium  100 mg Oral BID  . enalapril  10 mg Oral Daily  . ferric gluconate (FERRLECIT/NULECIT) IV  62.5 mg Intravenous Q Thu-HD  . hydrALAZINE  50 mg Oral BID WC  . isosorbide dinitrate  20 mg Oral TID  . multivitamin  1 tablet Oral Daily  . pantoprazole (PROTONIX) IV  40 mg Intravenous Q12H  . sodium chloride  3 mL Intravenous Q12H  . warfarin   Does not apply Once  . Warfarin - Pharmacist Dosing Inpatient   Does not apply q1800  . DISCONTD: sodium chloride  3 mL Intravenous Q12H   Infusions:    . argatroban infusion 1 mcg/kg/min (02/07/12 1033)    Assessment:  21 year old with PMH ESRD,Polycystic kidney disease, renal transplant failed admitted with chest pain and hemoptysis found to have PE  diagnosed by V-Q scan  CT chest ordered to evaluate for pulmonary hemorrhage, infarct.   Hemotysis somewhat more today, likely due to PE (with infarction ?)  HIT suspected with > 50 % fall in platelets while on Heparin infusion. HIT panel pending. Coumadin on hold due to thrombocytopenia.  Argatroban is infusing at 70mcg/kg/min.  PTT therapeutic  68 secs.  INR 1.83 (partially due to lab additive effect from Argatroban).  Hemoglobin down  12.9  >>  10.7.  Platelets continuing to drop  128,000  >>  70,000.  Platelet rebound may take 2-3 days off Heparin.  Goal of Therapy:   APTT 50 - 90 seconds  INR 2  -  3   Plan:   Continue Heparin at 1 mcg/kg/min.  Hold Coumadin until Platelets recover to  > 150,000.  Marv Alfrey, Elisha Headland, Pharm.D. 02/07/2012 12:50 PM  Plan Correction:  Patient is NOT on Heparin.  Will continue Argatroban @ 1 mcg/kg/min.  Hold Coumadin as above.  Benjimen Kelley, Elisha Headland, Pharm.D. 02/07/2012 1:24 PM

## 2012-02-07 NOTE — Progress Notes (Signed)
Subjective:  Complains of occasional pleuritic chest pain associated with mild hemoptysis CCM consulted. States overall feels better Objective:  Vital Signs in the last 24 hours: Temp:  [97.9 F (36.6 C)-98.8 F (37.1 C)] 98 F (36.7 C) (04/17 1710) Pulse Rate:  [56-104] 104  (04/17 1710) Resp:  [18-20] 19  (04/17 1710) BP: (103-163)/(66-90) 119/84 mmHg (04/17 1710) SpO2:  [96 %-100 %] 97 % (04/17 1710) Weight:  [71.85 kg (158 lb 6.4 oz)] 71.85 kg (158 lb 6.4 oz) (04/16 2013)  Intake/Output from previous day: 04/16 0701 - 04/17 0700 In: 203 [P.O.:200; I.V.:3] Out: 3000  Intake/Output from this shift:    Physical Exam: Neck: no adenopathy, no carotid bruit, no JVD and supple, symmetrical, trachea midline Lungs: clear to auscultation bilaterally Heart: regular rate and rhythm, S1, S2 normal and Soft systolic murmur noted no S3 gallop Abdomen: soft, non-tender; bowel sounds normal; no masses,  no organomegaly Extremities: extremities normal, atraumatic, no cyanosis or edema  Lab Results:  Basename 02/07/12 0700 02/06/12 1546 02/06/12 0730  WBC 6.7 -- 6.6  HGB 10.7* 12.9 --  PLT 70* -- 128*    Basename 02/06/12 0730 02/05/12 0610  NA 136 134*  K 3.4* 4.2  CL 97 96  CO2 25 19  GLUCOSE 92 78  BUN 31* 21  CREATININE 8.48* 6.52*   No results found for this basename: TROPONINI:2,CK,MB:2 in the last 72 hours Hepatic Function Panel  Basename 02/06/12 0730  PROT --  ALBUMIN 2.8*  AST --  ALT --  ALKPHOS --  BILITOT --  BILIDIR --  IBILI --   No results found for this basename: CHOL in the last 72 hours No results found for this basename: PROTIME in the last 72 hours  Imaging: Imaging results have been reviewed and Ct Chest Wo Contrast  02/07/2012  *RADIOLOGY REPORT*  Clinical Data: Follow-up cavitation.  Pulmonary emboli.  CT CHEST WITHOUT CONTRAST  Technique:  Multidetector CT imaging of the chest was performed following the standard protocol without IV contrast.   Comparison: 02/03/2012  Findings: The ground-glass air consolidation in the lingula is less pronounced than prior study.  New similarly appearing ground-glass areas of consolidation are now present in the right upper lobe. This likely reflects pulmonary infarct in the area supplied by the previously seen right upper lobe artery contained pulmonary embolus.  [name] right pleural effusion, enlarging since prior study. Previously seen cavitary area within the right lower lobe is unchanged.  This area measures approximately 16 mm.  Mild surrounding ground-glass density, stable.  There is cardiomegaly.  Right dialysis catheter remains in place, unchanged. No mediastinal, hilar, or axillary adenopathy.  No left pleural effusion. Visualized thyroid and chest wall soft tissues unremarkable.  Imaging into the upper abdomen demonstrates calcifications as well as numerous mixed density cysts within the kidneys bilaterally compatible with end-stage renal disease.  No acute findings in the upper abdomen.  IMPRESSION: Lingular ground-glass airspace disease slightly decreased since prior study.  New right upper lobe ground-glass airspace disease, presumably pulmonary infarcts in the area of the previously seen pulmonary embolus.  Slight enlargement of the right pleural effusion which remains small.  Cavitary rounded area within the right lower lobe measures up to 16 mm.  Surrounding ground-glass opacities.  Findings are stable. Differential considerations would include infectious process versus a cavitary lesion/malignancy.  Recommend continued follow up.  Original Report Authenticated By: Cyndie Chime, M.D.    Cardiac Studies:  Assessment/Plan:  Nonischemic cardiomyopathy probably secondary to  long-standing hypertension  Hypertensive heart disease with systolic dysfunction  End-stage renal disease on hemodialysis  Acute bilateral pulmonary embolism/infarction status post recurrent hemoptysis  Thrombocytopenia rule out  heparin-induced thrombocytopenia  History of polycystic kidney disease Plan Continue present management Increase beta blockers and ACE inhibitor as as tolerated I will sign off please call if needed  LOS: 5 days    Pragya Lofaso N 02/07/2012, 7:39 PM

## 2012-02-08 ENCOUNTER — Inpatient Hospital Stay (HOSPITAL_COMMUNITY): Payer: Managed Care, Other (non HMO)

## 2012-02-08 ENCOUNTER — Encounter (HOSPITAL_COMMUNITY)
Admission: EM | Disposition: A | Discharge status: Home / Self Care | Payer: Self-pay | Source: Home / Self Care | Attending: Internal Medicine

## 2012-02-08 DIAGNOSIS — R042 Hemoptysis: Secondary | ICD-10-CM | POA: Diagnosis present

## 2012-02-08 DIAGNOSIS — N186 End stage renal disease: Secondary | ICD-10-CM

## 2012-02-08 DIAGNOSIS — I2699 Other pulmonary embolism without acute cor pulmonale: Secondary | ICD-10-CM

## 2012-02-08 LAB — RENAL FUNCTION PANEL
BUN: 35 mg/dL — ABNORMAL HIGH (ref 6–23)
Calcium: 9.9 mg/dL (ref 8.4–10.5)
Creatinine, Ser: 7.99 mg/dL — ABNORMAL HIGH (ref 0.50–1.10)
Glucose, Bld: 83 mg/dL (ref 70–99)
Phosphorus: 2.9 mg/dL (ref 2.3–4.6)
Potassium: 4.1 mEq/L (ref 3.5–5.1)

## 2012-02-08 LAB — HEPARIN LEVEL (UNFRACTIONATED): Heparin Unfractionated: 0.38 IU/mL (ref 0.30–0.70)

## 2012-02-08 LAB — CBC
HCT: 35.3 % — ABNORMAL LOW (ref 36.0–46.0)
HCT: 35 % — ABNORMAL LOW (ref 36.0–46.0)
HCT: 43.7 % (ref 36.0–46.0)
Hemoglobin: 10.7 g/dL — ABNORMAL LOW (ref 12.0–15.0)
Hemoglobin: 11.1 g/dL — ABNORMAL LOW (ref 12.0–15.0)
MCH: 28.6 pg (ref 26.0–34.0)
MCHC: 30.3 g/dL (ref 30.0–36.0)
MCHC: 31.7 g/dL (ref 30.0–36.0)
MCV: 90.2 fL (ref 78.0–100.0)
MCV: 90.7 fL (ref 78.0–100.0)
RDW: 20.1 % — ABNORMAL HIGH (ref 11.5–15.5)
WBC: 8.9 10*3/uL (ref 4.0–10.5)

## 2012-02-08 SURGERY — ARTERIOVENOUS (AV) FISTULA CREATION
Anesthesia: Monitor Anesthesia Care | Site: Arm Upper | Laterality: Left

## 2012-02-08 MED ORDER — HYDROMORPHONE HCL PF 1 MG/ML IJ SOLN
INTRAMUSCULAR | Status: AC
  Administered 2012-02-08: 1 mg via INTRAVENOUS
  Filled 2012-02-08: qty 1

## 2012-02-08 MED ORDER — WARFARIN SODIUM 7.5 MG PO TABS
7.5000 mg | ORAL_TABLET | Freq: Once | ORAL | Status: AC
  Administered 2012-02-08: 7.5 mg via ORAL
  Filled 2012-02-08: qty 1

## 2012-02-08 MED ORDER — HEPARIN (PORCINE) IN NACL 100-0.45 UNIT/ML-% IJ SOLN
1000.0000 [IU]/h | INTRAMUSCULAR | Status: DC
  Administered 2012-02-08: 1000 [IU]/h via INTRAVENOUS
  Filled 2012-02-08 (×2): qty 250

## 2012-02-08 NOTE — Consult Note (Signed)
Agree with the above  I do not think the right arm fistula is salvagable.  She will need new access.  Vein mapping indicates that she would be a candidate for left arm BVT vs cephalic vein fistula.  There is mention of thrombus within the cephalic vein at the elbow.  This will need to be evaluated in the OR.  She is currently theraputic on coumadin due to her PE.  She  May benefit from having this procedure at a later date once she has recovered from her PE.   [name]

## 2012-02-08 NOTE — Progress Notes (Signed)
ANTICOAGULATION CONSULT NOTE - Follow Up Consult  Pharmacy Consult for : Coumadin with Heparin bridging (replacing Argatroban) Indication: PE (with HIT Negative Panel)  Allergies  Allergen Reactions  . Morphine And Related Itching    Patient Measurements: Height: 5\' 3"  (160 cm) Weight: 163 lb 5.8 oz (74.1 kg) IBW/kg (Calculated) : 52.4    Vital Signs: Temp: 98.4 F (36.9 C) (04/18 0615) Temp src: Oral (04/18 0615) BP: 121/76 mmHg (04/18 1000) Pulse Rate: 86  (04/18 1000)  Labs:  Basename 02/08/12 0715 02/08/12 0659 02/07/12 0700 02/06/12 1546 02/06/12 0730  HGB -- 11.1* 10.7* -- --  HCT -- 35.0* 35.3* 40.7 --  PLT -- 108* 70* -- 128*  APTT 66* -- 68* -- 67*  LABPROT 22.1* -- 21.5* -- 24.8*  INR 1.90* -- 1.83* -- 2.20*  HEPARINUNFRC -- -- -- -- --  CREATININE -- 7.99* -- -- 8.48*  CKTOTAL -- -- -- -- --  CKMB -- -- -- -- --  TROPONINI -- -- -- -- --   Estimated Creatinine Clearance: 10.8 ml/min (by C-G formula based on Cr of 7.99).   Medications:  Scheduled:    . calcium acetate  1,334 mg Oral TID WC  . carvedilol  6.25 mg Oral BID WC  . cefUROXime (ZINACEF)  IV  1.5 g Intravenous On Call to OR  . darbepoetin (ARANESP) injection - DIALYSIS  40 mcg Intravenous Q Tue-HD  . docusate sodium  100 mg Oral BID  . enalapril  10 mg Oral Daily  . ferric gluconate (FERRLECIT/NULECIT) IV  62.5 mg Intravenous Q Thu-HD  . isosorbide dinitrate  20 mg Oral TID  . multivitamin  1 tablet Oral Daily  . nystatin  10 mL Oral QID  . pantoprazole (PROTONIX) IV  40 mg Intravenous Q12H  . sodium chloride  3 mL Intravenous Q12H  . warfarin   Does not apply Once  . Warfarin - Pharmacist Dosing Inpatient   Does not apply q1800  . DISCONTD: hydrALAZINE  50 mg Oral BID WC   Infusions:    . argatroban infusion 1 mcg/kg/min (02/07/12 1033)    Assessment:  21 year old with PMH ESRD,Polycystic kidney disease, renal transplant failed admitted with chest pain and hemoptysis found to  have PE diagnosed by V-Q scan and probable pulmonary infarcts (per CT Chest)  HIT Panel Negative.  Platelets recovering, Hgb 11.1.  Patient continues with some hemoptysis.  Heparin can be restarted in place of Argatroban and Coumadin can be restarted with current Plt levels.  Goal of Therapy:   INR 2-3  Heparin level 0.3-0.7 units/ml   Plan:   DC Argatroban at end of dialysis today.  Restart Heparin, no bolus, at 1000 units/hr.  Heparin level, INR, and CBC 6 hours after Heparin restart.  Coumadin dose to be determined pending INR results.  Thula Stewart, Elisha Headland, Pharm.D. 02/08/2012 10:11 AM

## 2012-02-08 NOTE — Procedures (Signed)
Patient was seen on dialysis and the procedure was supervised.  BFR 375  Via PC BP is  114/70.   Patient appears to be tolerating treatment well  [name] Small A 02/08/2012

## 2012-02-08 NOTE — Progress Notes (Signed)
Subjective: Hemoptysis decreased  No CP, no sob   Objective: Vital signs in last 24 hours: Filed Vitals:   02/08/12 0930 02/08/12 1000 02/08/12 1030 02/08/12 1045  BP: 132/72 121/76 118/74 124/71  Pulse: 92 86 88 91  Temp:    97.7 F (36.5 C)  TempSrc:    Oral  Resp: 15 16 17 15   Height:      Weight:    71.4 kg (157 lb 6.5 oz)  SpO2:    100%   Weight change: 1.485 kg (3 lb 4.4 oz)  Intake/Output Summary (Last 24 hours) at 02/08/12 1305 Last data filed at 02/08/12 1045  Gross per 24 hour  Intake    240 ml  Output   3000 ml  Net  -2760 ml    Physical Exam: General: Awake, Oriented, No acute distress. HEENT: EOMI. Neck: Supple CV: S1 and S2, rrr Lungs: Clear to ascultation bilaterally, no wheezing Abdomen: Soft, Nontender, Nondistended, +bowel sounds. Ext: Good pulses. Trace edema.   Lab Results:  [facility] 02/08/12 0659 02/06/12 0730  NA 134* 136  K 4.1 3.4*  CL 98 97  CO2 25 25  GLUCOSE 83 92  BUN 35* 31*  CREATININE 7.99* 8.48*  CALCIUM 9.9 9.5  MG -- --  PHOS 2.9 3.7    Basename 02/08/12 0659 02/06/12 0730  AST -- --  ALT -- --  ALKPHOS -- --  BILITOT -- --  PROT -- --  ALBUMIN 2.8* 2.8*   No results found for this basename: LIPASE:2,AMYLASE:2 in the last 72 hours  Basename 02/08/12 0659 02/07/12 0700  WBC 8.3 6.7  NEUTROABS -- --  HGB 11.1* 10.7*  HCT 35.0* 35.3*  MCV 90.2 94.4  PLT 108* 70*   No results found for this basename: CKTOTAL:3,CKMB:3,CKMBINDEX:3,TROPONINI:3 in the last 72 hours No components found with this basename: POCBNP:3 No results found for this basename: DDIMER:2 in the last 72 hours No results found for this basename: HGBA1C:2 in the last 72 hours No results found for this basename: CHOL:2,HDL:2,LDLCALC:2,TRIG:2,CHOLHDL:2,LDLDIRECT:2 in the last 72 hours No results found for this basename: TSH,T4TOTAL,FREET3,T3FREE,THYROIDAB in the last 72 hours No results found for this basename:  VITAMINB12:2,FOLATE:2,FERRITIN:2,TIBC:2,IRON:2,RETICCTPCT:2 in the last 72 hours  Micro Results: Recent Results (from the past 240 hour(s))  MRSA PCR SCREENING     Status: Normal   Collection Time   02/03/12  3:57 AM      Component Value Range Status Comment   MRSA by PCR NEGATIVE  NEGATIVE  Final   CLOSTRIDIUM DIFFICILE BY PCR     Status: Normal   Collection Time   02/04/12  8:56 AM      Component Value Range Status Comment   C difficile by pcr NEGATIVE  NEGATIVE  Final     Studies/Results: Ct Chest Wo Contrast  02/07/2012  *RADIOLOGY REPORT*  Clinical Data: Follow-up cavitation.  Pulmonary emboli.  CT CHEST WITHOUT CONTRAST  Technique:  Multidetector CT imaging of the chest was performed following the standard protocol without IV contrast.  Comparison: 02/03/2012  Findings: The ground-glass air consolidation in the lingula is less pronounced than prior study.  New similarly appearing ground-glass areas of consolidation are now present in the right upper lobe. This likely reflects pulmonary infarct in the area supplied by the previously seen right upper lobe artery contained pulmonary embolus.  Small right pleural effusion, enlarging since prior study. Previously seen cavitary area within the right lower lobe is unchanged.  This area measures approximately 16 mm.  Mild  surrounding ground-glass density, stable.  There is cardiomegaly.  Right dialysis catheter remains in place, unchanged. No mediastinal, hilar, or axillary adenopathy.  No left pleural effusion. Visualized thyroid and chest wall soft tissues unremarkable.  Imaging into the upper abdomen demonstrates calcifications as well as numerous mixed density cysts within the kidneys bilaterally compatible with end-stage renal disease.  No acute findings in the upper abdomen.  IMPRESSION: Lingular ground-glass airspace disease slightly decreased since prior study.  New right upper lobe ground-glass airspace disease, presumably pulmonary infarcts  in the area of the previously seen pulmonary embolus.  Slight enlargement of the right pleural effusion which remains small.  Cavitary rounded area within the right lower lobe measures up to 16 mm.  Surrounding ground-glass opacities.  Findings are stable. Differential considerations would include infectious process versus a cavitary lesion/malignancy.  Recommend continued follow up.  Original Report Authenticated By: Cyndie Chime, M.D.    Medications: I have reviewed the patient's current medications. Scheduled Meds:    . calcium acetate  1,334 mg Oral TID WC  . carvedilol  6.25 mg Oral BID WC  . cefUROXime (ZINACEF)  IV  1.5 g Intravenous On Call to OR  . darbepoetin (ARANESP) injection - DIALYSIS  40 mcg Intravenous Q Tue-HD  . docusate sodium  100 mg Oral BID  . enalapril  10 mg Oral Daily  . ferric gluconate (FERRLECIT/NULECIT) IV  62.5 mg Intravenous Q Thu-HD  . isosorbide dinitrate  20 mg Oral TID  . multivitamin  1 tablet Oral Daily  . nystatin  10 mL Oral QID  . pantoprazole (PROTONIX) IV  40 mg Intravenous Q12H  . sodium chloride  3 mL Intravenous Q12H  . warfarin   Does not apply Once  . Warfarin - Pharmacist Dosing Inpatient   Does not apply q1800  . DISCONTD: hydrALAZINE  50 mg Oral BID WC   Continuous Infusions:    . argatroban infusion 1 mcg/kg/min (02/07/12 1033)  . heparin 1,000 Units/hr (02/08/12 1152)   PRN Meds:.sodium chloride, acetaminophen, acetaminophen, albuterol, alum & mag hydroxide-simeth, diphenhydrAMINE, guaiFENesin-codeine, HYDROmorphone, ondansetron (ZOFRAN) IV, ondansetron, oxyCODONE, sodium chloride, DISCONTD: guaiFENesin-dextromethorphan  Assessment/Plan: 21 year old with PMH ESRD,Polycystic kidney disease, renal transplant failed admitted with chest pain and hemoptysis found to have PE diagnosed by V-Q scan, ct chest ordered to evaluate for pulmonary hemorrhage, infarct. She also has chronic abdominal pain.   Pulmonary Embolism:  CT show PE,  pulmonary infarct would explain hemoptysis. Platelet decrease more than 50 %. HIT negative. Change back to heparin.  Continue anticoagulation per pharmacy protocol. start coumadin, once INR therapeutic plan to D/C patient home and return for vascular to address her access  Cardiomyopathy: ECHO show EF 20 %. Continue lisinopril. Patient was started on coreg. ESR 5, ANA, DNA double strand. Appreciate cardio help. Needs ECHO in few months to reassess EF.   Increase TSH: Free T3 normal. Free T4 mild elevated. Active form is normal. She will need repeat thyroid function test in 4 weeks.   ESRD (end stage renal disease) (02/03/2012) Renal following.   HTN;  Hydralazine and lisinopril.   Diarrhea;  C diff negative.  Improved.   Left side lower quadrant Abdominal pain: CT negative for acute finding. Needs to follow up with PCP.   Hemoptysis- appreciate pulm's help, CT scan done shows: Lingular ground-glass airspace disease slightly decreased since  prior study. New right upper lobe ground-glass airspace disease,  presumably pulmonary infarcts in the area of the previously seen  pulmonary  embolus.  Slight enlargement of the right pleural effusion which remains  small.  Cavitary rounded area within the right lower lobe measures up to 16  mm. Surrounding ground-glass opacities. Findings are stable.  Differential considerations would include infectious process versus  a cavitary lesion/malignancy. Recommend continued follow up.     LOS: 6 days  Shene Maxfield, DO 02/08/2012, 1:05 PM

## 2012-02-08 NOTE — Progress Notes (Addendum)
Patient: [name] Small DOB: Jan 26, 1991 Date of Admission: 02/02/2012            Pulmonary Progress Note  MD requesting consult:  Dr. Benjamine Mola ([facility]) Reason for consult:  hemoptysis  HPI - 20 yobm with hx ESRD on HD r/t polycystic kidney disease who presented 4/12 with c/o chest pain and hemoptysis.  She was dx with PE and and admitted by [facility].  She was initially placed on heparin but developed thrombocytopenia and ?HIT and was changed to Argatroban.  She cont to have mod amount hemoptysis and PCCM consulted 4/17   Overnight/subjective: Continues to have L ant cp/ no more hemoptysis  Vital Signs BP 122/70  Pulse 89  Temp(Src) 98 F (36.7 C) (Oral)  Resp 18  Ht 5\' 3"  (1.6 m)  Wt 157 lb 6.5 oz (71.4 kg)  BMI 27.88 kg/m2  SpO2 100%  LMP 01/17/2012  RA   Ct Chest Wo Contrast  02/07/2012  *RADIOLOGY REPORT*  Clinical Data: Follow-up cavitation.  Pulmonary emboli.  CT CHEST WITHOUT CONTRAST  Technique:  Multidetector CT imaging of the chest was performed following the standard protocol without IV contrast.  Comparison: 02/03/2012  Findings: The ground-glass air consolidation in the lingula is less pronounced than prior study.  New similarly appearing ground-glass areas of consolidation are now present in the right upper lobe. This likely reflects pulmonary infarct in the area supplied by the previously seen right upper lobe artery contained pulmonary embolus.  Small right pleural effusion, enlarging since prior study. Previously seen cavitary area within the right lower lobe is unchanged.  This area measures approximately 16 mm.  Mild surrounding ground-glass density, stable.  There is cardiomegaly.  Right dialysis catheter remains in place, unchanged. No mediastinal, hilar, or axillary adenopathy.  No left pleural effusion. Visualized thyroid and chest wall soft tissues unremarkable.  Imaging into the upper abdomen demonstrates calcifications as well as numerous mixed density cysts within the  kidneys bilaterally compatible with end-stage renal disease.  No acute findings in the upper abdomen.  IMPRESSION: Lingular ground-glass airspace disease slightly decreased since prior study.  New right upper lobe ground-glass airspace disease, presumably pulmonary infarcts in the area of the previously seen pulmonary embolus.  Slight enlargement of the right pleural effusion which remains small.  Cavitary rounded area within the right lower lobe measures up to 16 mm.  Surrounding ground-glass opacities.  Findings are stable. Differential considerations would include infectious process versus a cavitary lesion/malignancy.  Recommend continued follow up.  Original Report Authenticated By: Cyndie Chime, M.D.    Lab 02/08/12 0659 02/06/12 0730 02/05/12 0610  NA 134* 136 134*  K 4.1 3.4* 4.2  CL 98 97 96  CO2 25 25 19   BUN 35* 31* 21  CREATININE 7.99* 8.48* 6.52*  GLUCOSE 83 92 78    Lab 02/08/12 0659 02/07/12 0700 02/06/12 1546 02/06/12 0730  HGB 11.1* 10.7* 12.9 --  HCT 35.0* 35.3* 40.7 --  WBC 8.3 6.7 -- 6.6  PLT 108* 70* -- 128*     EXAM: General: Cushingnoid appearing young AAF.No complaints of hemoptysis  Neuro:Alert and interactive, moving all ext to command. CV: RRR, Nl S1/S2, -M/R/G. PULM: CTA bilaterally. GI: Soft, NT, ND and +BS. Extremities: -edema and -tenderness.   IMPRESSION/ PLAN:  1. Hemoptysis  -- In setting PE/pulm infarct on anticoagulation with thrombocytopenia.  - HIT neg 4/15   Lab 02/08/12 0659 02/07/12 0700 02/06/12 1546 02/06/12 0730 02/05/12 0610  HGB 11.1* 10.7* 12.9 11.2* 12.3  Lab 02/08/12 0659 02/07/12 0700 02/06/12 0730 02/05/12 0610 02/04/12 0625  PLT 108* 70* 128* 105* 120*   Lab Results  Component Value Date   INR 1.90* 02/08/2012   INR 1.83* 02/07/2012   INR 2.20* 02/06/2012   PLAN -   Repeat CT 4/17 c/w infarcts only              Hemoptysis as expected is resolving, no further w/u      2. PE --  Mod clot burden.  See discussion  above. Echo 4/14 Left ventricle: The cavity size was normal. Systolic function was severely reduced. The estimated ejection fraction was in the range of 15% to 20%. Severe diffuse hypokinesis. - Aortic valve: Mild regurgitation. - Mitral valve: Moderate regurgitation directed posteriorly. - Right atrium: The atrium was mildly dilated. - Pericardium, extracardiac: A small pericardial effusion was identified. -Lower ext dopplers  4/18 > neg bilaterally -No  Evidence of PAH > prognosis good from a pulmonary perspective- we will see prn   Other problems per [facility]/renal: ESRD, HTN, Diarrhea, cardiomyopathy  Brett Canales Minor ACNP [facility] Pollack PCCM Pager [phone] till 3 pm If no answer page [phone] 02/08/2012, 1:20 PM  Pt independently  seen and examined and available cxr's reviewed and I agree with above findings/ imp/ plan  Sandrea Hughs, MD Pulmonary and Critical Care Medicine [facility] Cell [phone]

## 2012-02-08 NOTE — Progress Notes (Signed)
VASCULAR LAB PRELIMINARY  PRELIMINARY  PRELIMINARY  PRELIMINARY  Bilateral lower extremity venous Dopplers completed.    Preliminary report:  There is no DVT or SVT noted in the bilateral lower extremities.  Sherren Kerns [place], 02/08/2012, 3:13 PM

## 2012-02-08 NOTE — Progress Notes (Signed)
ANTICOAGULATION CONSULT NOTE - Follow Up Consult  Pharmacy Consult for : Coumadin with Heparin bridging (replacing Argatroban) Indication: PE (with HIT Negative Panel)  Allergies  Allergen Reactions  . Morphine And Related Itching    Patient Measurements: Height: 5\' 3"  (160 cm) Weight: 157 lb 6.5 oz (71.4 kg) IBW/kg (Calculated) : 52.4    Vital Signs: Temp: 98.2 F (36.8 C) (04/18 1748) Temp src: Oral (04/18 1748) BP: 114/66 mmHg (04/18 1748) Pulse Rate: 78  (04/18 1748)  Labs:  Basename 02/08/12 1747 02/08/12 0715 02/08/12 0659 02/07/12 0700 02/06/12 0730  HGB 14.0 -- 11.1* -- --  HCT 43.7 -- 35.0* 35.3* --  PLT 99* -- 108* 70* --  APTT -- 66* -- 68* 67*  LABPROT 15.5* 22.1* -- 21.5* --  INR 1.20 1.90* -- 1.83* --  HEPARINUNFRC 0.38 -- -- -- --  CREATININE -- -- 7.99* -- 8.48*  CKTOTAL -- -- -- -- --  CKMB -- -- -- -- --  TROPONINI -- -- -- -- --   Estimated Creatinine Clearance: 10.6 ml/min (by C-G formula based on Cr of 7.99).   Medications:  Scheduled:     . calcium acetate  1,334 mg Oral TID WC  . carvedilol  6.25 mg Oral BID WC  . cefUROXime (ZINACEF)  IV  1.5 g Intravenous On Call to OR  . darbepoetin (ARANESP) injection - DIALYSIS  40 mcg Intravenous Q Tue-HD  . docusate sodium  100 mg Oral BID  . enalapril  10 mg Oral Daily  . ferric gluconate (FERRLECIT/NULECIT) IV  62.5 mg Intravenous Q Thu-HD  . isosorbide dinitrate  20 mg Oral TID  . multivitamin  1 tablet Oral Daily  . nystatin  10 mL Oral QID  . pantoprazole (PROTONIX) IV  40 mg Intravenous Q12H  . sodium chloride  3 mL Intravenous Q12H  . warfarin   Does not apply Once  . Warfarin - Pharmacist Dosing Inpatient   Does not apply q1800  . DISCONTD: hydrALAZINE  50 mg Oral BID WC   Infusions:     . argatroban infusion 1 mcg/kg/min (02/07/12 1033)  . heparin 1,000 Units/hr (02/08/12 1152)    Assessment:  21 year old with PMH ESRD,Polycystic kidney disease, renal transplant failed  admitted with chest pain and hemoptysis found to have PE diagnosed by V-Q scan and probable pulmonary infarcts (per CT Chest)  Heparin level therapeutic, INR = 1.2  Goal of Therapy:   INR 2-3  Heparin level 0.3-0.7 units/ml   Plan:  1) Continue heparin at 1000 units / hr 2) Coumadin 7.5 mg po x 1 dose tonight 3) Follow up AM labs  Elwin Sleight, Pharm.D. 02/08/2012 6:46 PM

## 2012-02-08 NOTE — Progress Notes (Signed)
Subjective:  Resting comfortably on dialysis this AM.  Reportedly still having hemoptysis, pulm now involved.  HIT came back negative  Objective: Vital signs in last 24 hours: Temp:  [97.7 F (36.5 C)-98.8 F (37.1 C)] 98.4 F (36.9 C) (04/18 0615) Pulse Rate:  [86-104] 87  (04/18 0830) Resp:  [16-25] 16  (04/18 0830) BP: (100-121)/(54-89) 116/79 mmHg (04/18 0830) SpO2:  [96 %-100 %] 100 % (04/18 0615) Weight:  [72.485 kg (159 lb 12.8 oz)-74.1 kg (163 lb 5.8 oz)] 74.1 kg (163 lb 5.8 oz) (04/18 0615) Weight change: 1.485 kg (3 lb 4.4 oz)  Intake/Output from previous day: 04/17 0701 - 04/18 0700 In: 720 [P.O.:720] Out: -    EXAM: General appearance:  Alert, in no apparent distress Resp:  CTA bilaterally  Cardio:  RRR without murmur GI:  + BS, soft and nontender Extremities:  No edema Access:  Right IJ catheter, clotted AVF @ RUA   Lab Results:  Basename 02/08/12 0659 02/07/12 0700  WBC 8.3 6.7  HGB 11.1* 10.7*  HCT 35.0* 35.3*  PLT 108* 70*   BMET:   Basename 02/08/12 0659 02/06/12 0730  NA 134* 136  K 4.1 3.4*  CL 98 97  CO2 25 25  GLUCOSE 83 92  BUN 35* 31*  CREATININE 7.99* 8.48*  CALCIUM 9.9 9.5  ALBUMIN 2.8* 2.8*   No results found for this basename: PTH:2 in the last 72 hours Iron Studies: No results found for this basename: IRON,TIBC,TRANSFERRIN,FERRITIN in the last 72 hours  INR 1.9  Assessment/Plan: 1.  Bilateral pulmonary embolism - argatroban (for possible HIT) HIT assay is actually negative /Coumadin bridge; IR unable to declot AVF secondary to extensive clot, question whether PE is related. Plans for anticoagulation per primary team and pulmonary, also with hypercoagulable work up.  Could patient be challenged with heparin again ?? 2.  ESRD - on HD on TTS at [facility], now via PC; pre-HD K 3.4 yesterday.  Regarding access [facility] wants to give time for clot to epithelialize and plan on new access in the near future at which time patient will need to be again  admitted for anticoagulation bridge. 3.  Anemia - Hgb 11.1 on Araensp 40 mcg on Tues (on outpatient Epo 20,000 U), weekly Fe on Thurs.  4.  Secondary hyperparathyroidism - Ca 9.9 (10 corrected), P 3.7 on Fosrenol 1 g with meals, off Zemplar.  5.  HTN/Volume - BP improved yesterday after med changes.  Actually will stop hydralazine because redundant therapy.  Continue vasotec and coreg and titrate those to affect 6.  Diarrhea - C. Diff negative 4/14; improving.  7.  Left abdominal pain - CT negative.  8.  S/p failed transplant - transplant in [place] in 07/2007, failed in 08/2011 (secondary to missed Prograf), nephrectomy 09/15/11.  9.Cardiomyopathy- known and likely due to longstanding hypertension, medical management  10.Dispo- Per primary and CCM, once issues with PE and anticoagulation sorted out.  Again will no t have new permanent access done this admit.  OK to try patient on regular diet (probably what she eats at home) and monitor potassium and phos      LOS: 6 days   Marieann Zipp A 02/08/2012,8:54 AM

## 2012-02-09 LAB — CBC
HCT: 37.9 % (ref 36.0–46.0)
Hemoglobin: 11.7 g/dL — ABNORMAL LOW (ref 12.0–15.0)
Hemoglobin: 11.7 g/dL — ABNORMAL LOW (ref 12.0–15.0)
MCHC: 30.9 g/dL (ref 30.0–36.0)
MCHC: 31.1 g/dL (ref 30.0–36.0)
Platelets: 95 10*3/uL — ABNORMAL LOW (ref 150–400)
RDW: 20 % — ABNORMAL HIGH (ref 11.5–15.5)
WBC: 7.2 10*3/uL (ref 4.0–10.5)

## 2012-02-09 LAB — HEPARIN LEVEL (UNFRACTIONATED): Heparin Unfractionated: 0.45 IU/mL (ref 0.30–0.70)

## 2012-02-09 LAB — PROTIME-INR: INR: 1.18 (ref 0.00–1.49)

## 2012-02-09 MED ORDER — SORBITOL 70 % SOLN
30.0000 mL | Freq: Every day | Status: DC | PRN
  Administered 2012-02-11: 30 mL via ORAL
  Filled 2012-02-09: qty 30

## 2012-02-09 MED ORDER — HEPARIN SODIUM (PORCINE) 1000 UNIT/ML DIALYSIS
20.0000 [IU]/kg | INTRAMUSCULAR | Status: DC | PRN
  Filled 2012-02-09: qty 2

## 2012-02-09 MED ORDER — HYDROMORPHONE HCL PF 1 MG/ML IJ SOLN
0.5000 mg | INTRAMUSCULAR | Status: DC | PRN
  Administered 2012-02-09 – 2012-02-14 (×23): 0.5 mg via INTRAVENOUS
  Filled 2012-02-09 (×26): qty 1

## 2012-02-09 MED ORDER — HEPARIN (PORCINE) IN NACL 100-0.45 UNIT/ML-% IJ SOLN
900.0000 [IU]/h | INTRAMUSCULAR | Status: DC
  Administered 2012-02-09 – 2012-02-10 (×2): 950 [IU]/h via INTRAVENOUS
  Administered 2012-02-11 – 2012-02-13 (×3): 900 [IU]/h via INTRAVENOUS
  Filled 2012-02-09 (×6): qty 250

## 2012-02-09 MED ORDER — WARFARIN SODIUM 5 MG PO TABS
5.0000 mg | ORAL_TABLET | Freq: Once | ORAL | Status: DC
  Filled 2012-02-09: qty 1

## 2012-02-09 MED ORDER — WARFARIN SODIUM 5 MG PO TABS
5.0000 mg | ORAL_TABLET | Freq: Once | ORAL | Status: AC
  Administered 2012-02-09: 5 mg via ORAL
  Filled 2012-02-09: qty 1

## 2012-02-09 NOTE — Progress Notes (Signed)
Can we please get parameters on the Coreg? Pt's BP 99/66 Pulse 95 received Dilaudid 1mg  iv for pain. 8:00 dose opf Coreg held

## 2012-02-09 NOTE — Progress Notes (Signed)
Patient: [name] Small DOB: Oct 29, 1990 Date of Admission: 02/02/2012            Pulmonary Progress Note  MD requesting consult:  Dr. Benjamine Mola ([facility]) Reason for consult:  hemoptysis  HPI - 20 yobm with hx ESRD on HD r/t polycystic kidney disease who presented 4/12 with c/o chest pain and hemoptysis.  She was dx with PE and and admitted by [facility].  She was initially placed on heparin but developed thrombocytopenia and ?HIT and was changed to Argatroban 4/15.  She cont to have mod amount hemoptysis and PCCM consulted 4/17, hit panel neg and heparin restarted 4/18  Tests/studies Echo 4/14 Left ventricle: The cavity size was normal. Systolic function was severely reduced. The estimated ejection fraction was in the range of 15% to 20%. Severe diffuse hypokinesis. - Aortic valve: Mild regurgitation. - Mitral valve: Moderate regurgitation directed posteriorly. - Right atrium: The atrium was mildly dilated. - Pericardium, extracardiac: A small pericardial effusion was identified. -Lower ext dopplers  4/18 > neg bilaterally -No  Evidence of PAH CT chest 4/17: Lingular ground-glass airspace disease slightly decreased since prior study. New right upper lobe ground-glass airspace disease, presumably pulmonary infarcts in the area of the previously seen pulmonary embolus. Slight enlargement of the right pleural effusion which remains small. Cavitary rounded area within the right lower lobe measures up to 16 mm. Surrounding ground-glass opacities. Findings are stable.  Differential considerations would include infectious process versus a cavitary lesion/malignancy. Recommend continued follow up.  Overnight/subjective: Continues to have L ant cp had 2-3 episodes of hemoptysis x sev tbsps  Vital Signs BP 101/67  Pulse 104  Temp(Src) 98.6 F (37 C) (Oral)  Resp 20  Ht 5\' 3"  (1.6 m)  Wt 69.718 kg (153 lb 11.2 oz)  BMI 27.23 kg/m2  SpO2 99%  LMP 01/17/2012  RA   EXAM: General: Cushingnoid  appearing young AAF.No complaints of hemoptysis  Neuro:Alert and interactive, moving all ext to command. CV: RRR, Nl S1/S2, -M/R/G. PULM: CTA bilaterally. GI: Soft, NT, ND and +BS. Extremities: -edema and -tenderness.   IMPRESSION/ PLAN:  1. Hemoptysis  -- In setting PE/pulm infarct on anticoagulation with thrombocytopenia. Repeat CT 4/17 c/w infarcts only  - HIT neg 4/15  - Hemoptysis   started again when heparin restarted. Had big hgb drop strongly doubt this is due to hemoptysis Plan -monitor  -heparin stopped 0952 4/19 x 12 hours max                2. PE --  Mod clot burden.    > prognosis good from a pulmonary perspective- Recommendation -holding anticoagulation for now, but will look at resuming w/in next 12 h vs go ahead with filter, a very very unusual step in hemoptysis related to infarction  3. Anemia Suspect that this is somewhat dilutional   Lab 02/09/12 0530 02/08/12 1747 02/08/12 0659  HGB 11.7* 14.0 11.1*  Plan: -trend -observe for increasing hemoptysis as we cont coumadin and heparin.   Other problems per [facility]/renal: ESRD, HTN, Diarrhea, cardiomyopathy - prefer no acei here as cough due to ace  Would really cloud the picture avoidably  Sandrea Hughs, MD Pulmonary and Critical Care Medicine [facility] Cell [phone]

## 2012-02-09 NOTE — Progress Notes (Signed)
Subjective:  No new complaints this AM.  Hemoptysis improving.  Still c/o pain.  Now back on heparin but INR is only 1.18.  BP has been lowish  Objective: Vital signs in last 24 hours: Temp:  [97.7 F (36.5 C)-98.5 F (36.9 C)] 97.8 F (36.6 C) (04/19 0517) Pulse Rate:  [78-102] 95  (04/19 0644) Resp:  [15-18] 17  (04/19 0644) BP: (99-124)/(66-89) 99/68 mmHg (04/19 0644) SpO2:  [94 %-100 %] 98 % (04/19 0644) Weight:  [69.718 kg (153 lb 11.2 oz)-71.4 kg (157 lb 6.5 oz)] 69.718 kg (153 lb 11.2 oz) (04/18 2102) Weight change: -1.085 kg (-2 lb 6.3 oz)  Intake/Output from previous day: 04/18 0701 - 04/19 0700 In: 240 [P.O.:240] Out: 3000    EXAM: General appearance:  Alert, in no apparent distress Resp:  CTA bilaterally  Cardio:  RRR without murmur GI:  + BS, soft and nontender Extremities:  No edema Access:  Right IJ catheter, clotted AVF @ RUA   Lab Results:  Basename 02/09/12 0530 02/08/12 1747  WBC 7.2 8.9  HGB 11.7* 14.0  HCT 37.9 43.7  PLT 102* 99*   BMET:   Basename 02/08/12 0659  NA 134*  K 4.1  CL 98  CO2 25  GLUCOSE 83  BUN 35*  CREATININE 7.99*  CALCIUM 9.9  ALBUMIN 2.8*   No results found for this basename: PTH:2 in the last 72 hours Iron Studies: No results found for this basename: IRON,TIBC,TRANSFERRIN,FERRITIN in the last 72 hours  INR 1.9  Assessment/Plan: 1.  Bilateral pulmonary embolism - Now back on heparin/coumadin (HIT neg) IR unable to declot AVF secondary to extensive clot, question whether PE is related. Plans for anticoagulation per primary team and pulmonary, also with hypercoagulable work up.   2.  ESRD - on HD on TTS at [facility], now via [facility]; .  Regarding access [facility] wants to give time for clot to epithelialize and plan on new access in the near future at which time patient will need to be again admitted for anticoagulation bridge. 3.  Anemia - Hgb 11.7 on Araensp 40 mcg on Tues (on outpatient Epo 20,000 U), weekly Fe on Thurs.  4.  Secondary  hyperparathyroidism - Ca 9.9 (10 corrected), P 3.7 on Fosrenol 1 g with meals, off Zemplar.  5.  HTN/Volume - BP still lowish. stopped hydralazine yesterday because redundant therapy.  Continue vasotec and coreg and titrate those to affect.  Actually will hold vasotec today for bp low.  Willl monitor for need for d/c vasotec permanently 6.  Diarrhea - C. Diff negative 4/14; improving.  Now complaining of constipation, see below 7.  Left abdominal pain - CT negative.  8.  S/p failed transplant - transplant in [place] in 07/2007, failed in 08/2011 (secondary to missed Prograf), nephrectomy 09/15/11.  9.Cardiomyopathy- known and likely due to longstanding hypertension, medical management.  Will eventually need repeat echo 10.Dispo- Per primary and CCM, once issues with PE and anticoagulation sorted out.  Again will no t have new permanent access done this admit.  OK to keep patient on regular diet (probably what she eats at home) and monitor potassium and phos.  11. Constipation- give sorbitol PRN     LOS: 7 days   [name] Small A 02/09/2012,9:57 AM

## 2012-02-09 NOTE — Progress Notes (Addendum)
Patient: [name] Small DOB: 1991/10/16 Date of Admission: 02/02/2012            Pulmonary Progress Note  MD requesting consult:  Dr. Benjamine Mola ([facility]) Reason for consult:  hemoptysis  HPI - 20 yobm with hx ESRD on HD r/t polycystic kidney disease who presented 4/12 with c/o chest pain and hemoptysis.  Dx with multiple PE by CTa 4/13   and admitted by [facility].  She was initially placed on heparin but developed thrombocytopenia and ?HIT and was changed to Argatroban 4/15.  She cont to have mod amount hemoptysis and PCCM consulted 4/17, hit panel neg and heparin restarted 4/18  Tests/studies Echo 4/14 Left ventricle: The cavity size was normal. Systolic function was severely reduced. The estimated ejection fraction was in the range of 15% to 20%. Severe diffuse hypokinesis. - Aortic valve: Mild regurgitation. - Mitral valve: Moderate regurgitation directed posteriorly. - Right atrium: The atrium was mildly dilated. - Pericardium, extracardiac: A small pericardial effusion was identified. -Lower ext dopplers  4/18 > neg bilaterally -No  Evidence of PAH CT chest 4/17: Lingular ground-glass airspace disease slightly decreased since prior study. New right upper lobe ground-glass airspace disease, presumably pulmonary infarcts in the area of the previously seen pulmonary embolus. Slight enlargement of the right pleural effusion which remains small. Cavitary rounded area within the right lower lobe measures up to 16 mm. Surrounding ground-glass opacities. Findings are stable.  Differential considerations would include infectious process versus a cavitary lesion/malignancy. Recommend continued follow up.  Overnight/subjective: Continues to have L ant cp had 2-3 episodes of hemoptysis x sev tbsps  Vital Signs BP 104/69  Pulse 97  Temp(Src) 98.6 F (37 C) (Oral)  Resp 20  Ht 5\' 3"  (1.6 m)  Wt 153 lb 11.2 oz (69.718 kg)  BMI 27.23 kg/m2  SpO2 99%  LMP 01/17/2012  RA   EXAM: General:  Cushingnoid appearing young [facility].No complaints of hemoptysis  Neuro:Alert and interactive, moving all ext to command. CV: RRR, Nl S1/S2, -M/R/G. PULM: CTA bilaterally. GI: Soft, NT, ND and +BS. Extremities: -edema and -tenderness.   IMPRESSION/ PLAN:  1. Hemoptysis  -- In setting ?PE/pulm infarct on anticoagulation with thrombocytopenia. Repeat CT 4/17 c/w infarcts only  - HIT neg 4/15  - Hemoptysis   started again when heparin restarted. Had big hgb drop strongly doubt this is due to hemoptysis Plan -monitor  -heparin stopped 0952 4/19 x 12 hours max> recheck cbc 4/19 pm and will need to decide re resume hep vs ivc filter   2. ?PE --  Mod clot burden by ctangiogram 4/13 assoc with infarcts  > prognosis good from a pulmonary perspective- Recommendation -holding anticoagulation for now, but will look at resuming w/in next 12 h vs go ahead with filter, a very very unusual step in hemoptysis related to infarction  3. Anemia Suspect that this is somewhat dilutional   Lab 02/09/12 0530 02/08/12 1747 02/08/12 0659  HGB 11.7* 14.0 11.1*  Plan: -trend -observe for increasing hemoptysis as we cont coumadin for now.   Other problems per [facility]/renal: ESRD, HTN, Diarrhea, cardiomyopathy - prefer no acei here as cough due to ace  Would really cloud the picture avoidably  Sandrea Hughs, MD Pulmonary and Critical Care Medicine [facility] Cell [phone]

## 2012-02-09 NOTE — Progress Notes (Signed)
Subjective: Hemoptysis decreased but still having  No CP, no sob   Objective: Vital signs in last 24 hours: Filed Vitals:   02/08/12 1748 02/08/12 2102 02/09/12 0517 02/09/12 0644  BP: 114/66 124/89 101/68 99/68  Pulse: 78 102 91 95  Temp: 98.2 F (36.8 C)  97.8 F (36.6 C)   TempSrc: Oral Oral Oral   Resp: 18 18 17 17   Height:      Weight:  69.718 kg (153 lb 11.2 oz)    SpO2: 100% 100% 94% 98%   Weight change: -1.085 kg (-2 lb 6.3 oz)  Intake/Output Summary (Last 24 hours) at 02/09/12 1040 Last data filed at 02/08/12 1700  Gross per 24 hour  Intake    240 ml  Output   3000 ml  Net  -2760 ml    Physical Exam: General: Awake, Oriented, No acute distress. HEENT: EOMI. Neck: Supple CV: S1 and S2, rrr Lungs: Clear to ascultation bilaterally, no wheezing Abdomen: Soft, Nontender, Nondistended, +bowel sounds. Ext: Good pulses. Trace edema.   Lab Results:  [facility] 02/08/12 0659  NA 134*  K 4.1  CL 98  CO2 25  GLUCOSE 83  BUN 35*  CREATININE 7.99*  CALCIUM 9.9  MG --  PHOS 2.9    Basename 02/08/12 0659  AST --  ALT --  ALKPHOS --  BILITOT --  PROT --  ALBUMIN 2.8*   No results found for this basename: LIPASE:2,AMYLASE:2 in the last 72 hours  Basename 02/09/12 0530 02/08/12 1747  WBC 7.2 8.9  NEUTROABS -- --  HGB 11.7* 14.0  HCT 37.9 43.7  MCV 90.5 90.7  PLT 102* 99*   No results found for this basename: CKTOTAL:3,CKMB:3,CKMBINDEX:3,TROPONINI:3 in the last 72 hours No components found with this basename: POCBNP:3 No results found for this basename: DDIMER:2 in the last 72 hours No results found for this basename: HGBA1C:2 in the last 72 hours No results found for this basename: CHOL:2,HDL:2,LDLCALC:2,TRIG:2,CHOLHDL:2,LDLDIRECT:2 in the last 72 hours No results found for this basename: TSH,T4TOTAL,FREET3,T3FREE,THYROIDAB in the last 72 hours No results found for this basename: VITAMINB12:2,FOLATE:2,FERRITIN:2,TIBC:2,IRON:2,RETICCTPCT:2 in the  last 72 hours  Micro Results: Recent Results (from the past 240 hour(s))  MRSA PCR SCREENING     Status: Normal   Collection Time   02/03/12  3:57 AM      Component Value Range Status Comment   MRSA by PCR NEGATIVE  NEGATIVE  Final   CLOSTRIDIUM DIFFICILE BY PCR     Status: Normal   Collection Time   02/04/12  8:56 AM      Component Value Range Status Comment   C difficile by pcr NEGATIVE  NEGATIVE  Final     Studies/Results: Ct Chest Wo Contrast  02/07/2012  *RADIOLOGY REPORT*  Clinical Data: Follow-up cavitation.  Pulmonary emboli.  CT CHEST WITHOUT CONTRAST  Technique:  Multidetector CT imaging of the chest was performed following the standard protocol without IV contrast.  Comparison: 02/03/2012  Findings: The ground-glass air consolidation in the lingula is less pronounced than prior study.  New similarly appearing ground-glass areas of consolidation are now present in the right upper lobe. This likely reflects pulmonary infarct in the area supplied by the previously seen right upper lobe artery contained pulmonary embolus.  Small right pleural effusion, enlarging since prior study. Previously seen cavitary area within the right lower lobe is unchanged.  This area measures approximately 16 mm.  Mild surrounding ground-glass density, stable.  There is cardiomegaly.  Right dialysis catheter remains in  place, unchanged. No mediastinal, hilar, or axillary adenopathy.  No left pleural effusion. Visualized thyroid and chest wall soft tissues unremarkable.  Imaging into the upper abdomen demonstrates calcifications as well as numerous mixed density cysts within the kidneys bilaterally compatible with end-stage renal disease.  No acute findings in the upper abdomen.  IMPRESSION: Lingular ground-glass airspace disease slightly decreased since prior study.  New right upper lobe ground-glass airspace disease, presumably pulmonary infarcts in the area of the previously seen pulmonary embolus.  Slight  enlargement of the right pleural effusion which remains small.  Cavitary rounded area within the right lower lobe measures up to 16 mm.  Surrounding ground-glass opacities.  Findings are stable. Differential considerations would include infectious process versus a cavitary lesion/malignancy.  Recommend continued follow up.  Original Report Authenticated By: Cyndie Chime, M.D.    Medications: I have reviewed the patient's current medications. Scheduled Meds:    . calcium acetate  1,334 mg Oral TID WC  . carvedilol  6.25 mg Oral BID WC  . cefUROXime (ZINACEF)  IV  1.5 g Intravenous On Call to OR  . darbepoetin (ARANESP) injection - DIALYSIS  40 mcg Intravenous Q Tue-HD  . docusate sodium  100 mg Oral BID  . enalapril  10 mg Oral Daily  . ferric gluconate (FERRLECIT/NULECIT) IV  62.5 mg Intravenous Q Thu-HD  . isosorbide dinitrate  20 mg Oral TID  . multivitamin  1 tablet Oral Daily  . nystatin  10 mL Oral QID  . pantoprazole (PROTONIX) IV  40 mg Intravenous Q12H  . sodium chloride  3 mL Intravenous Q12H  . warfarin  7.5 mg Oral Once  . warfarin   Does not apply Once  . Warfarin - Pharmacist Dosing Inpatient   Does not apply q1800   Continuous Infusions:    . argatroban infusion 1 mcg/kg/min (02/07/12 1033)  . DISCONTD: heparin 1,000 Units/hr (02/08/12 1152)   PRN Meds:.sodium chloride, acetaminophen, acetaminophen, albuterol, alum & mag hydroxide-simeth, diphenhydrAMINE, guaiFENesin-codeine, heparin, HYDROmorphone, ondansetron (ZOFRAN) IV, ondansetron, oxyCODONE, sodium chloride, sorbitol, DISCONTD: HYDROmorphone  Assessment/Plan: 21 year old with PMH ESRD,Polycystic kidney disease, renal transplant failed admitted with chest pain and hemoptysis found to have PE diagnosed by V-Q scan, ct chest ordered to evaluate for pulmonary hemorrhage, infarct. She also has chronic abdominal pain.   Pulmonary Embolism:  CT show PE, pulmonary infarct would explain hemoptysis. Platelet decrease  more than 50 %. HIT negative. Change back to heparin.  Continue anticoagulation per pharmacy protocol. start coumadin, once INR therapeutic plan to D/C patient home and return for vascular to address her access  Cardiomyopathy: ECHO show EF 20 %. Continue lisinopril. Patient was started on coreg. ESR 5, ANA, DNA double strand. Appreciate cardio help. Needs ECHO in few months to reassess EF.   Increase TSH: Free T3 normal. Free T4 mild elevated. Active form is normal. She will need repeat thyroid function test in 4 weeks.   ESRD (end stage renal disease) (02/03/2012) Renal following.   HTN;  Hydralazine and lisinopril.   Diarrhea;  C diff negative.  Improved.   Left side lower quadrant Abdominal pain: CT negative for acute finding. Needs to follow up with PCP.   Hemoptysis- appreciate pulm's help, CT scan done shows: Lingular ground-glass airspace disease slightly decreased since  prior study. New right upper lobe ground-glass airspace disease,  presumably pulmonary infarcts in the area of the previously seen  pulmonary embolus.  Slight enlargement of the right pleural effusion which remains  small.  Cavitary rounded area within the right lower lobe measures up to 16  mm. Surrounding ground-glass opacities. Findings are stable.  Differential considerations would include infectious process versus  a cavitary lesion/malignancy. Recommend continued follow up. Hgb probably related to days of dialysis and volume pulm following    LOS: 7 days  Tanis Hensarling, DO 02/09/2012, 10:40 AM

## 2012-02-09 NOTE — Progress Notes (Addendum)
ANTICOAGULATION CONSULT NOTE - Follow Up Consult  Pharmacy Consult for : Coumadin with Heparin bridging Indication: pulmonary embolus  Allergies  Allergen Reactions  . Morphine And Related Itching    Patient Measurements: Height: 5\' 3"  (160 cm) Weight: 153 lb 11.2 oz (69.718 kg) IBW/kg (Calculated) : 52.4    Vital Signs: Temp: 98.6 F (37 C) (04/19 1000) Temp src: Oral (04/19 1000) BP: 101/67 mmHg (04/19 1000) Pulse Rate: 104  (04/19 1000)  Labs:  Basename 02/09/12 0530 02/08/12 1747 02/08/12 0715 02/08/12 0659 02/07/12 0700  HGB 11.7* 14.0 -- -- --  HCT 37.9 43.7 -- 35.0* --  PLT 102* 99* -- 108* --  APTT -- -- 66* -- 68*  LABPROT 15.3* 15.5* 22.1* -- --  INR 1.18 1.20 1.90* -- --  HEPARINUNFRC 0.45 0.38 -- -- --  CREATININE -- -- -- 7.99* --  CKTOTAL -- -- -- -- --  CKMB -- -- -- -- --  TROPONINI -- -- -- -- --   Estimated Creatinine Clearance: 10.5 ml/min (by C-G formula based on Cr of 7.99).   Medications:     calcium acetate 1,334 mg Oral TID WC  carvedilol 6.25 mg Oral BID WC  cefUROXime (ZINACEF)  IV 1.5 g Intravenous On Call to OR  darbepoetin (ARANESP) injection - DIALYSIS 40 mcg Intravenous Q Tue-HD  docusate sodium 100 mg Oral BID  enalapril 10 mg Oral Daily  ferric gluconate (FERRLECIT/NULECIT) IV 62.5 mg Intravenous Q Thu-HD  isosorbide dinitrate 20 mg Oral TID  multivitamin 1 tablet Oral Daily  nystatin 10 mL Oral QID  pantoprazole (PROTONIX) IV 40 mg Intravenous Q12H  sodium chloride 3 mL Intravenous Q12H  warfarin 7.5 mg Oral Once  warfarin  Does not apply Once  Warfarin - Pharmacist Dosing Inpatient  Does not apply q1800      DISCONTD: heparin Last Rate: 1,000 Units/hr (02/08/12 1152)   Assessment:  21 year old with PMH ESRD,Polycystic kidney disease, renal transplant failed admitted with chest pain and hemoptysis found to have PE, pulmonary infarct  Pt reports renewed hemoptysis this AM after having 'stopped last night"  Hgb  reported to have dropped significantly overnight   14 >> 11.7 , platelets stable  102,000  INR  1.18 --Goal 2 - 3,  Heparin level 0.45--Goal 0.3 - 0.7   Plan:   Per discussion with Dr. Sherene Sires, with renewed hemoptysis this AM and drop in Hgb overnight, will stop Heparin until a close evaluation can be done.  Patient likely require continued anticoagulation and Heparin will likely be restarted pending MD's evaluation.  Will follow-up and dose anticoagulants as indicated.   Stramoski, Elisha Headland, Pharm.D. 02/09/2012 3:01 PM     Addum:  Will order coumadin 5 mg po today.  F/u am labs. Talbert Cage, Pharm D  Addum:  OK to restart heparin with out bolus and low end of therapeutic range.  Restart at 950 units/hr and f/u am labs. Talbert Cage, PharmD

## 2012-02-10 ENCOUNTER — Inpatient Hospital Stay (HOSPITAL_COMMUNITY): Payer: Managed Care, Other (non HMO)

## 2012-02-10 LAB — CBC
HCT: 34.5 % — ABNORMAL LOW (ref 36.0–46.0)
RDW: 19.7 % — ABNORMAL HIGH (ref 11.5–15.5)
WBC: 6.1 10*3/uL (ref 4.0–10.5)

## 2012-02-10 LAB — RENAL FUNCTION PANEL
Albumin: 2.9 g/dL — ABNORMAL LOW (ref 3.5–5.2)
Calcium: 9.9 mg/dL (ref 8.4–10.5)
GFR calc Af Amer: 8 mL/min — ABNORMAL LOW (ref >90)
GFR calc non Af Amer: 7 mL/min — ABNORMAL LOW (ref >90)
Glucose, Bld: 84 mg/dL (ref 70–99)
Phosphorus: 2.8 mg/dL (ref 2.3–4.6)
Potassium: 3.9 mEq/L (ref 3.5–5.1)
Sodium: 134 mEq/L — ABNORMAL LOW (ref 135–145)

## 2012-02-10 LAB — HEPARIN LEVEL (UNFRACTIONATED): Heparin Unfractionated: 0.39 IU/mL (ref 0.30–0.70)

## 2012-02-10 LAB — PROTIME-INR
INR: 1.35 (ref 0.00–1.49)
Prothrombin Time: 16.9 seconds — ABNORMAL HIGH (ref 11.6–15.2)

## 2012-02-10 MED ORDER — RENA-VITE PO TABS
1.0000 | ORAL_TABLET | Freq: Every day | ORAL | Status: DC
  Administered 2012-02-10 – 2012-02-13 (×4): 1 via ORAL
  Filled 2012-02-10 (×5): qty 1

## 2012-02-10 MED ORDER — WARFARIN SODIUM 7.5 MG PO TABS
7.5000 mg | ORAL_TABLET | Freq: Once | ORAL | Status: AC
  Administered 2012-02-10: 7.5 mg via ORAL
  Filled 2012-02-10: qty 1

## 2012-02-10 NOTE — Progress Notes (Signed)
Patient: [name] Small DOB: [birth year] Date of Admission: 02/02/2012            Pulmonary Progress Note  MD requesting consult:  Dr. Benjamine Mola ([facility]) Reason for consult:  hemoptysis  HPI - 20 yobm with hx ESRD on HD r/t polycystic kidney disease who presented 4/12 with c/o chest pain and hemoptysis.  She was dx with PE and and admitted by [facility].  She was initially placed on heparin but developed thrombocytopenia and ?HIT and was changed to Argatroban 4/15.  She cont to have mod amount hemoptysis and PCCM consulted 4/17, hit panel neg and heparin restarted 4/18  Tests/studies Echo 4/14 Left ventricle: The cavity size was normal. Systolic function was severely reduced. The estimated ejection fraction was in the range of 15% to 20%. Severe diffuse hypokinesis. - Aortic valve: Mild regurgitation. - Mitral valve: Moderate regurgitation directed posteriorly. - Right atrium: The atrium was mildly dilated. - Pericardium, extracardiac: A small pericardial effusion was identified. -Lower ext dopplers  4/18 > neg bilaterally -No  Evidence of PAH CT chest 4/17: Lingular ground-glass airspace disease slightly decreased since prior study. New right upper lobe ground-glass airspace disease, presumably pulmonary infarcts in the area of the previously seen pulmonary embolus. Slight enlargement of the right pleural effusion which remains small. Cavitary rounded area within the right lower lobe measures up to 16 mm. Surrounding ground-glass opacities. Findings are stable.  Differential considerations would include infectious process versus a cavitary lesion/malignancy. Recommend continued follow up.  Overnight/subjective: Pt stable this am on HD.  No increased wob, no hemoptysis overnight.  Back on heparin iv.   Vital Signs BP 113/72  Pulse 86  Temp(Src) 97.8 F (36.6 C) (Oral)  Resp 18  Ht 5\' 3"  (1.6 m)  Wt 72.4 kg (159 lb 9.8 oz)  BMI 28.27 kg/m2  SpO2 100%  LMP 01/17/2012  RA   EXAM: General:  Cushingnoid appearing young AAF.No complaints of hemoptysis  Chest with faint basilar crackles, o/w clear Cor with rrr Abd soft, nontender, bs+ Alert and oriented, moves all 4.   IMPRESSION/ PLAN:  1. Hemoptysis  -- In setting PE/pulm infarct on anticoagulation with thrombocytopenia. Repeat CT 4/17 c/w infarcts only  - HIT neg 4/15  - Hemoptysis   started again when heparin restarted. Had big hgb drop strongly doubt this is due to hemoptysis   2. PE --  Mod clot burden.    > prognosis good from a pulmonary perspective- Recommendation -now back on heparin, and tolerating.  No hemoptysis issues overnight.  H and H ok.  If she cannot tolerate heparin due to bleeding, I would recommend removable IVC filter.  Only 15% of PE pts have + LE dopplers, yet that is where clot originates in majority of cases.  I can't exclude clot from her UE, but I would place filter regardless if she cannot tolerate anticoagulation.

## 2012-02-10 NOTE — Progress Notes (Signed)
Subjective: Hemoptysis decreased but still having  No CP, no sob   Objective: Vital signs in last 24 hours: Filed Vitals:   02/10/12 0830 02/10/12 0900 02/10/12 0930 02/10/12 1000  BP: 113/66 122/80 122/87 122/81  Pulse: 92 91 89 89  Temp:      TempSrc:      Resp:      Height:      Weight:      SpO2:       Weight change: 0 kg (0 lb)  Intake/Output Summary (Last 24 hours) at 02/10/12 1017 Last data filed at 02/09/12 2002  Gross per 24 hour  Intake    600 ml  Output      0 ml  Net    600 ml    Physical Exam: General: Awake, Oriented, No acute distress. HEENT: EOMI. Neck: Supple CV: S1 and S2, rrr Lungs: Clear to ascultation bilaterally, no wheezing Abdomen: Soft, Nontender, Nondistended, +bowel sounds. Ext: Good pulses. Trace edema.   Lab Results:  [facility] 02/10/12 0740 02/08/12 0659  NA 134* 134*  K 3.9 4.1  CL 98 98  CO2 24 25  GLUCOSE 84 83  BUN 35* 35*  CREATININE 7.63* 7.99*  CALCIUM 9.9 9.9  MG -- --  PHOS 2.8 2.9    Basename 02/10/12 0740 02/08/12 0659  AST -- --  ALT -- --  ALKPHOS -- --  BILITOT -- --  PROT -- --  ALBUMIN 2.9* 2.8*   No results found for this basename: LIPASE:2,AMYLASE:2 in the last 72 hours  Basename 02/10/12 0550 02/09/12 1800  WBC 6.1 7.1  NEUTROABS -- --  HGB 10.8* 11.7*  HCT 34.5* 37.6  MCV 89.6 90.6  PLT 113* 95*   No results found for this basename: CKTOTAL:3,CKMB:3,CKMBINDEX:3,TROPONINI:3 in the last 72 hours No components found with this basename: POCBNP:3 No results found for this basename: DDIMER:2 in the last 72 hours No results found for this basename: HGBA1C:2 in the last 72 hours No results found for this basename: CHOL:2,HDL:2,LDLCALC:2,TRIG:2,CHOLHDL:2,LDLDIRECT:2 in the last 72 hours No results found for this basename: TSH,T4TOTAL,FREET3,T3FREE,THYROIDAB in the last 72 hours No results found for this basename: VITAMINB12:2,FOLATE:2,FERRITIN:2,TIBC:2,IRON:2,RETICCTPCT:2 in the last 72  hours  Micro Results: Recent Results (from the past 240 hour(s))  MRSA PCR SCREENING     Status: Normal   Collection Time   02/03/12  3:57 AM      Component Value Range Status Comment   MRSA by PCR NEGATIVE  NEGATIVE  Final   CLOSTRIDIUM DIFFICILE BY PCR     Status: Normal   Collection Time   02/04/12  8:56 AM      Component Value Range Status Comment   C difficile by pcr NEGATIVE  NEGATIVE  Final     Studies/Results: No results found.  Medications: I have reviewed the patient's current medications. Scheduled Meds:    . calcium acetate  1,334 mg Oral TID WC  . carvedilol  6.25 mg Oral BID WC  . darbepoetin (ARANESP) injection - DIALYSIS  40 mcg Intravenous Q Tue-HD  . docusate sodium  100 mg Oral BID  . ferric gluconate (FERRLECIT/NULECIT) IV  62.5 mg Intravenous Q Thu-HD  . isosorbide dinitrate  20 mg Oral TID  . multivitamin  1 tablet Oral QHS  . nystatin  10 mL Oral QID  . pantoprazole (PROTONIX) IV  40 mg Intravenous Q12H  . sodium chloride  3 mL Intravenous Q12H  . warfarin  5 mg Oral Once  . warfarin  7.5 mg Oral ONCE-1800  . warfarin   Does not apply Once  . Warfarin - Pharmacist Dosing Inpatient   Does not apply q1800  . DISCONTD: enalapril  10 mg Oral Daily  . DISCONTD: multivitamin  1 tablet Oral Daily  . DISCONTD: warfarin  5 mg Oral Once   Continuous Infusions:    . heparin 950 Units/hr (02/09/12 2301)   PRN Meds:.sodium chloride, acetaminophen, acetaminophen, albuterol, alum & mag hydroxide-simeth, diphenhydrAMINE, guaiFENesin-codeine, heparin, HYDROmorphone, ondansetron (ZOFRAN) IV, ondansetron, oxyCODONE, sodium chloride, sorbitol  Assessment/Plan: 21 year old with PMH ESRD,Polycystic kidney disease, renal transplant failed admitted with chest pain and hemoptysis found to have PE diagnosed by V-Q scan, ct chest ordered to evaluate for pulmonary hemorrhage, infarct. She also has chronic abdominal pain.   Pulmonary Embolism:  CT show PE, pulmonary  infarct would explain hemoptysis. Platelet decrease more than 50 %. HIT negative. Change back to heparin.  Continue anticoagulation per pharmacy protocol. start coumadin, once INR therapeutic plan to D/C patient home and return for vascular to address her access  Cardiomyopathy: ECHO show EF 20 %. Continue lisinopril. Patient was started on coreg. ESR 5, ANA, DNA double strand. Appreciate cardio help. Needs ECHO in few months to reassess EF.   Increase TSH: Free T3 normal. Free T4 mild elevated. Active form is normal. She will need repeat thyroid function test in 4 weeks.   ESRD (end stage renal disease) (02/03/2012) Renal following.   HTN;  Hydralazine and lisinopril.   Diarrhea;  C diff negative.  Improved.   Left side lower quadrant Abdominal pain: CT negative for acute finding. Needs to follow up with PCP.   Hemoptysis- appreciate pulm's help, CT scan done shows: Lingular ground-glass airspace disease slightly decreased since  prior study. New right upper lobe ground-glass airspace disease,  presumably pulmonary infarcts in the area of the previously seen  pulmonary embolus.  Slight enlargement of the right pleural effusion which remains  small.  Cavitary rounded area within the right lower lobe measures up to 16  mm. Surrounding ground-glass opacities. Findings are stable.  Differential considerations would include infectious process versus  a cavitary lesion/malignancy. Recommend continued follow up. Hgb probably related to days of dialysis and volume pulm following  D/C once INR between 2-3   LOS: 8 days  Yasmin Bronaugh, DO 02/10/2012, 10:17 AM

## 2012-02-10 NOTE — Progress Notes (Signed)
Call placed to Dr. Benjamine Mola, informed patient's blood pressure 83/59, no c/o sob or chest pain.  Patient is awake and alert and oriented x 4.  New orders received.

## 2012-02-10 NOTE — Progress Notes (Signed)
ANTICOAGULATION CONSULT NOTE - Follow Up Consult  Pharmacy Consult for : Coumadin with Heparin bridging  Indication: PE (with HIT Negative Panel)  Allergies  Allergen Reactions  . Morphine And Related Itching    Patient Measurements: Height: 5\' 3"  (160 cm) Weight: 159 lb 9.8 oz (72.4 kg) IBW/kg (Calculated) : 52.4    Vital Signs: Temp: 97.8 F (36.6 C) (04/20 0640) Temp src: Oral (04/20 0640) BP: 117/71 mmHg (04/20 0800) Pulse Rate: 93  (04/20 0800)  Labs:  Basename 02/10/12 0550 02/09/12 1800 02/09/12 0530 02/08/12 1747 02/08/12 0715 02/08/12 0659  HGB 10.8* 11.7* -- -- -- --  HCT 34.5* 37.6 37.9 -- -- --  PLT 113* 95* 102* -- -- --  APTT -- -- -- -- 66* --  LABPROT 16.9* -- 15.3* 15.5* -- --  INR 1.35 -- 1.18 1.20 -- --  HEPARINUNFRC 0.39 -- 0.45 0.38 -- --  CREATININE -- -- -- -- -- 7.99*  CKTOTAL -- -- -- -- -- --  CKMB -- -- -- -- -- --  TROPONINI -- -- -- -- -- --   Estimated Creatinine Clearance: 10.7 ml/min (by C-G formula based on Cr of 7.99).   Medications:  Scheduled:     . calcium acetate  1,334 mg Oral TID WC  . carvedilol  6.25 mg Oral BID WC  . darbepoetin (ARANESP) injection - DIALYSIS  40 mcg Intravenous Q Tue-HD  . docusate sodium  100 mg Oral BID  . ferric gluconate (FERRLECIT/NULECIT) IV  62.5 mg Intravenous Q Thu-HD  . isosorbide dinitrate  20 mg Oral TID  . multivitamin  1 tablet Oral Daily  . nystatin  10 mL Oral QID  . pantoprazole (PROTONIX) IV  40 mg Intravenous Q12H  . sodium chloride  3 mL Intravenous Q12H  . warfarin  5 mg Oral Once  . warfarin   Does not apply Once  . Warfarin - Pharmacist Dosing Inpatient   Does not apply q1800  . DISCONTD: enalapril  10 mg Oral Daily  . DISCONTD: warfarin  5 mg Oral Once   Infusions:     . heparin 950 Units/hr (02/09/12 2301)  . DISCONTD: heparin 1,000 Units/hr (02/08/12 1152)    Assessment:  21 year old with PMH ESRD,Polycystic kidney disease, renal transplant failed admitted with  chest pain and hemoptysis found to have PE diagnosed by V-Q scan and probable pulmonary infarcts (per CT Chest). Pt s/p hemoptysis and Hgb drop, heparin restarted w/out bolus per Dr. Sherene Sires and plan to aim for lower end of goal. Hgb low again @ 10.8  Heparin level therapeutic at low end of goal (0.39), INR = 1.35  Goal of Therapy:   INR 2-3  Heparin level 0.3-0.7 units/ml   Plan:  1) Continue heparin at 950 units / hr 2) Coumadin 7.5 mg po x 1 dose tonight 3) Follow up AM labs (CBC, HL, INR)  Shellyann Wandrey, Cristal Deer, Pharm.D. [phone] 02/10/2012 8:05 AM

## 2012-02-10 NOTE — Progress Notes (Signed)
Subjective:  No new complaints this AM.  Hemoptysis has stopped.  INR is 1.35.  Good point about the ACE, has been D/Cd-  I do see talk about  a filter, if we think that this clot was from clot in upper arm access and dopplers are negative, should we really do a filter?????  Objective: Vital signs in last 24 hours: Temp:  [97.7 F (36.5 C)-98.6 F (37 C)] 97.8 F (36.6 C) (04/20 0640) Pulse Rate:  [93-110] 93  (04/20 0800) Resp:  [17-20] 18  (04/20 0645) BP: (99-117)/(56-71) 117/71 mmHg (04/20 0800) SpO2:  [98 %-100 %] 100 % (04/20 0645) Weight:  [71.4 kg (157 lb 6.5 oz)-72.4 kg (159 lb 9.8 oz)] 72.4 kg (159 lb 9.8 oz) (04/20 0640) Weight change: 0 kg (0 lb)  Intake/Output from previous day: 04/19 0701 - 04/20 0700 In: 628 [P.O.:600; I.V.:28] Out: -    EXAM: General appearance:  Alert, in no apparent distress.  Resting on HD Resp:  CTA bilaterally  Cardio:  RRR without murmur GI:  + BS, soft and nontender Extremities:  No edema Access:  Right IJ catheter, clotted AVF @ RUA   Lab Results:  Basename 02/10/12 0550 02/09/12 1800  WBC 6.1 7.1  HGB 10.8* 11.7*  HCT 34.5* 37.6  PLT 113* 95*   BMET:   Basename 02/08/12 0659  NA 134*  K 4.1  CL 98  CO2 25  GLUCOSE 83  BUN 35*  CREATININE 7.99*  CALCIUM 9.9  ALBUMIN 2.8*   No results found for this basename: PTH:2 in the last 72 hours Iron Studies: No results found for this basename: IRON,TIBC,TRANSFERRIN,FERRITIN in the last 72 hours  INR 1.9  Assessment/Plan: 1.  Bilateral pulmonary embolism - Now back on heparin/coumadin (HIT neg) IR unable to declot AVF secondary to extensive clot, question whether PE is related. Plans for anticoagulation per primary team and pulmonary, also with hypercoagulable work up.  See question above regarding filter 2.  ESRD - on HD on TTS at [facility], now via PC; .  Regarding access [facility] wants to give time for clot to epithelialize and plan on new access in the near future at which time patient  will need to be again admitted for anticoagulation bridge. 3.  Anemia - Hgb 10.8  on Araensp 40 mcg on Tues (on outpatient Epo 20,000 U), weekly Fe on Thurs.  4.  Secondary hyperparathyroidism - Ca 9.9 (10 corrected), P 3.7 on Fosrenol 1 g with meals, off Zemplar.  5.  HTN/Volume - BP still lowish. stopped hydralazine Thursday because redundant therapy.  Continuecoreg and titrate those to affect.  Vasotec now stopped for cough and low BP.   6. GI- has been constipated and complaining of diarrhea this admit.  Currently OK 7.  Left abdominal pain - CT negative.  8.  S/p failed transplant - transplant in [place] in 07/2007, failed in 08/2011 (secondary to missed Prograf), nephrectomy 09/15/11.  9.Cardiomyopathy- known and likely due to longstanding hypertension, medical management.  Will eventually need repeat echo 10.Dispo-   OK to keep patient on regular diet (probably what she eats at home) and monitor potassium and phos.  Hopefully hemptysis will stay away as INR drifts out, as above not sure IVC filter is needed in this particular situation.  Home when INR therapeutic.        LOS: 8 days   [name] Small A 02/10/2012,8:49 AM

## 2012-02-10 NOTE — Procedures (Signed)
Patient was seen on dialysis and the procedure was supervised.  BFR 350  Via PC BP is  122/80.   Patient appears to be tolerating treatment well  Trenden Hazelrigg A 02/10/2012

## 2012-02-11 LAB — PROTIME-INR: Prothrombin Time: 18.4 seconds — ABNORMAL HIGH (ref 11.6–15.2)

## 2012-02-11 MED ORDER — PANTOPRAZOLE SODIUM 40 MG PO TBEC
40.0000 mg | DELAYED_RELEASE_TABLET | Freq: Two times a day (BID) | ORAL | Status: DC
  Administered 2012-02-11 – 2012-02-14 (×7): 40 mg via ORAL
  Filled 2012-02-11 (×7): qty 1

## 2012-02-11 MED ORDER — WARFARIN SODIUM 7.5 MG PO TABS
7.5000 mg | ORAL_TABLET | Freq: Once | ORAL | Status: AC
  Administered 2012-02-11: 7.5 mg via ORAL
  Filled 2012-02-11: qty 1

## 2012-02-11 NOTE — Progress Notes (Signed)
Subjective:  No new complaints this AM, more animated today.  Lab coming back to draw INR.  Hemoptysis has stopped.  INR is 1.35 as of yesterday.  Appreciate pulm assist, would do filter if cannot tolerate anticoagulation.    Objective: Vital signs in last 24 hours: Temp:  [97.6 F (36.4 C)-98.4 F (36.9 C)] 98.2 F (36.8 C) (04/21 0910) Pulse Rate:  [86-123] 103  (04/21 0910) Resp:  [18-23] 18  (04/21 0910) BP: (83-123)/(51-84) 114/81 mmHg (04/21 0910) SpO2:  [98 %-100 %] 100 % (04/21 0910) Weight:  [69.9 kg (154 lb 1.6 oz)-71.668 kg (158 lb)] 71.668 kg (158 lb) (04/20 2059) Weight change: -1.5 kg (-3 lb 4.9 oz)  Intake/Output from previous day: 04/20 0701 - 04/21 0700 In: 314 [P.O.:240; I.V.:64; IV Piggyback:10] Out: 2459  Total I/O In: 240 [P.O.:240] Out: -  EXAM: General appearance:  Alert, in no apparent distress.  Resp:  CTA bilaterally  Cardio:  RRR without murmur GI:  + BS, soft and nontender Extremities:  No edema Access:  Right IJ catheter, clotted AVF @ RUA   Lab Results:  Basename 02/10/12 0550 02/09/12 1800  WBC 6.1 7.1  HGB 10.8* 11.7*  HCT 34.5* 37.6  PLT 113* 95*   BMET:   Basename 02/10/12 0740  NA 134*  K 3.9  CL 98  CO2 24  GLUCOSE 84  BUN 35*  CREATININE 7.63*  CALCIUM 9.9  ALBUMIN 2.9*   No results found for this basename: PTH:2 in the last 72 hours Iron Studies: No results found for this basename: IRON,TIBC,TRANSFERRIN,FERRITIN in the last 72 hours  INR yest 1.35  Assessment/Plan: 1.  Bilateral pulmonary embolism - Now back on heparin/coumadin (HIT neg) IR unable to declot AVF secondary to extensive clot, question whether PE is related. Plans for anticoagulation per primary team and pulmonary, also with hypercoagulable work up. 2.  ESRD - on HD on TTS at [facility], now via PC; .  Regarding access [facility] wants to give time for clot to epithelialize and plan on new access in the near future at which time patient will need to be again admitted for  anticoagulation bridge. 3.  Anemia - Hgb 10.8  on Araensp 40 mcg on Tues (on outpatient Epo 20,000 U), weekly Fe on Thurs.  4.  Secondary hyperparathyroidism - Ca 9.9 (10 corrected), P 3.7 on Fosrenol 1 g with meals, off Zemplar.  5.  HTN/Volume - BP still lowish but assymptomatic. stopped hydralazine Thursday because redundant therapy.  Continue coreg and titrate to affect given cardiomyopathy.  Vasotec now stopped for cough and low BP.   6. GI- has been constipated and complaining of diarrhea this admit.  Currently OK 7.  Left abdominal pain - CT negative.  8.  S/p failed transplant - transplant in [place] in 07/2007, failed in 08/2011 (secondary to missed Prograf), nephrectomy 09/15/11.  9.Cardiomyopathy- known and likely due to longstanding hypertension, medical management.  Will eventually need repeat echo, continue coreg 10.Dispo-   OK to keep patient on regular diet (probably what she eats at home) and monitor potassium and phos.  Hopefully hemptysis will stay away as INR drifts out, Home when INR therapeutic.        LOS: 9 days   [name] Small A 02/11/2012,10:19 AM

## 2012-02-11 NOTE — Progress Notes (Addendum)
Subjective: Doing well  No CP, no sob   Objective: Vital signs in last 24 hours: Filed Vitals:   02/10/12 1756 02/10/12 2059 02/11/12 0511 02/11/12 0910  BP: 83/59 102/66 84/51 114/81  Pulse: 98 96 99 103  Temp: 98.4 F (36.9 C) 97.6 F (36.4 C) 97.8 F (36.6 C) 98.2 F (36.8 C)  TempSrc: Oral Oral Oral Oral  Resp: 21 20 18 18   Height:      Weight:  71.668 kg (158 lb)    SpO2: 98% 99% 99% 100%   Weight change: -1.5 kg (-3 lb 4.9 oz)  Intake/Output Summary (Last 24 hours) at 02/11/12 0946 Last data filed at 02/11/12 0900  Gross per 24 hour  Intake    554 ml  Output   2459 ml  Net  -1905 ml    Physical Exam: General: Awake, Oriented, No acute distress. HEENT: EOMI. Neck: Supple CV: S1 and S2, rrr Lungs: Clear to ascultation bilaterally, no wheezing Abdomen: Soft, Nontender, Nondistended, +bowel sounds. Ext: Good pulses. Trace edema.   Lab Results:  [facility] 02/10/12 0740  NA 134*  K 3.9  CL 98  CO2 24  GLUCOSE 84  BUN 35*  CREATININE 7.63*  CALCIUM 9.9  MG --  PHOS 2.8    Basename 02/10/12 0740  AST --  ALT --  ALKPHOS --  BILITOT --  PROT --  ALBUMIN 2.9*   No results found for this basename: LIPASE:2,AMYLASE:2 in the last 72 hours  Basename 02/10/12 0550 02/09/12 1800  WBC 6.1 7.1  NEUTROABS -- --  HGB 10.8* 11.7*  HCT 34.5* 37.6  MCV 89.6 90.6  PLT 113* 95*   No results found for this basename: CKTOTAL:3,CKMB:3,CKMBINDEX:3,TROPONINI:3 in the last 72 hours No components found with this basename: POCBNP:3 No results found for this basename: DDIMER:2 in the last 72 hours No results found for this basename: HGBA1C:2 in the last 72 hours No results found for this basename: CHOL:2,HDL:2,LDLCALC:2,TRIG:2,CHOLHDL:2,LDLDIRECT:2 in the last 72 hours No results found for this basename: TSH,T4TOTAL,FREET3,T3FREE,THYROIDAB in the last 72 hours No results found for this basename: VITAMINB12:2,FOLATE:2,FERRITIN:2,TIBC:2,IRON:2,RETICCTPCT:2 in the  last 72 hours  Micro Results: Recent Results (from the past 240 hour(s))  MRSA PCR SCREENING     Status: Normal   Collection Time   02/03/12  3:57 AM      Component Value Range Status Comment   MRSA by PCR NEGATIVE  NEGATIVE  Final   CLOSTRIDIUM DIFFICILE BY PCR     Status: Normal   Collection Time   02/04/12  8:56 AM      Component Value Range Status Comment   C difficile by pcr NEGATIVE  NEGATIVE  Final     Studies/Results: No results found.  Medications: I have reviewed the patient's current medications. Scheduled Meds:    . calcium acetate  1,334 mg Oral TID WC  . carvedilol  6.25 mg Oral BID WC  . darbepoetin (ARANESP) injection - DIALYSIS  40 mcg Intravenous Q Tue-HD  . docusate sodium  100 mg Oral BID  . ferric gluconate (FERRLECIT/NULECIT) IV  62.5 mg Intravenous Q Thu-HD  . isosorbide dinitrate  20 mg Oral TID  . multivitamin  1 tablet Oral QHS  . nystatin  10 mL Oral QID  . pantoprazole (PROTONIX) IV  40 mg Intravenous Q12H  . sodium chloride  3 mL Intravenous Q12H  . warfarin  7.5 mg Oral ONCE-1800  . warfarin   Does not apply Once  . Warfarin - Pharmacist Dosing  Inpatient   Does not apply q1800   Continuous Infusions:    . heparin 950 Units/hr (02/10/12 2217)   PRN Meds:.sodium chloride, acetaminophen, acetaminophen, albuterol, alum & mag hydroxide-simeth, diphenhydrAMINE, guaiFENesin-codeine, heparin, HYDROmorphone, ondansetron (ZOFRAN) IV, ondansetron, oxyCODONE, sodium chloride, sorbitol  Assessment/Plan: 21 year old with PMH ESRD,Polycystic kidney disease, renal transplant failed admitted with chest pain and hemoptysis found to have PE diagnosed by V-Q scan, ct chest ordered to evaluate for pulmonary hemorrhage, infarct. She also has chronic abdominal pain.   Pulmonary Embolism:  CT show PE, pulmonary infarct would explain hemoptysis. Platelet decrease more than 50 %. HIT negative. Change back to heparin.  Continue anticoagulation per pharmacy protocol.  start coumadin, once INR therapeutic plan to D/C patient home and return for vascular to address her access (await today's INR)  Cardiomyopathy: ECHO show EF 20 %. Continue lisinopril. Patient was started on coreg. ESR 5, ANA, DNA double strand. Appreciate cardio help. Needs ECHO in few months to reassess EF.   Increase TSH: Free T3 normal. Free T4 mild elevated. Active form is normal. She will need repeat thyroid function test in 4 weeks.   ESRD (end stage renal disease) (02/03/2012) Renal following.   Diarrhea;  C diff negative.  Improved.   Left side lower quadrant Abdominal pain: CT negative for acute finding. Needs to follow up with PCP.   Hemoptysis- appreciate pulm's help, CT scan done shows: Lingular ground-glass airspace disease slightly decreased since  prior study. New right upper lobe ground-glass airspace disease,  presumably pulmonary infarcts in the area of the previously seen  pulmonary embolus.  Slight enlargement of the right pleural effusion which remains  small.  Cavitary rounded area within the right lower lobe measures up to 16  mm. Surrounding ground-glass opacities. Findings are stable.  Differential considerations would include infectious process versus  a cavitary lesion/malignancy. Recommend continued follow up. Hgb probably related to days of dialysis and volume pulm following  D/C once INR between 2-3   LOS: 9 days  [name] Korte, DO 02/11/2012, 9:46 AM

## 2012-02-11 NOTE — Progress Notes (Signed)
ANTICOAGULATION CONSULT NOTE - Follow Up Consult  Pharmacy Consult for : Coumadin with Heparin bridging  Indication: PE (with HIT Negative Panel)  Allergies  Allergen Reactions  . Morphine And Related Itching    Patient Measurements: Height: 5\' 3"  (160 cm) Weight: 158 lb (71.668 kg) IBW/kg (Calculated) : 52.4    Vital Signs: Temp: 98.2 F (36.8 C) (04/21 0910) Temp src: Oral (04/21 0910) BP: 114/81 mmHg (04/21 0910) Pulse Rate: 103  (04/21 0910)  Labs:  Basename 02/11/12 0945 02/10/12 0740 02/10/12 0550 02/09/12 1800 02/09/12 0530  HGB -- -- 10.8* 11.7* --  HCT -- -- 34.5* 37.6 37.9  PLT -- -- 113* 95* 102*  APTT -- -- -- -- --  LABPROT 18.4* -- 16.9* -- 15.3*  INR 1.50* -- 1.35 -- 1.18  HEPARINUNFRC 0.70 -- 0.39 -- 0.45  CREATININE -- 7.63* -- -- --  CKTOTAL -- -- -- -- --  CKMB -- -- -- -- --  TROPONINI -- -- -- -- --   Estimated Creatinine Clearance: 11.2 ml/min (by C-G formula based on Cr of 7.63).   Medications:  Scheduled:     . calcium acetate  1,334 mg Oral TID WC  . carvedilol  6.25 mg Oral BID WC  . darbepoetin (ARANESP) injection - DIALYSIS  40 mcg Intravenous Q Tue-HD  . docusate sodium  100 mg Oral BID  . ferric gluconate (FERRLECIT/NULECIT) IV  62.5 mg Intravenous Q Thu-HD  . isosorbide dinitrate  20 mg Oral TID  . multivitamin  1 tablet Oral QHS  . nystatin  10 mL Oral QID  . pantoprazole  40 mg Oral BID  . sodium chloride  3 mL Intravenous Q12H  . warfarin  7.5 mg Oral ONCE-1800  . warfarin  7.5 mg Oral ONCE-1800  . warfarin   Does not apply Once  . Warfarin - Pharmacist Dosing Inpatient   Does not apply q1800  . DISCONTD: pantoprazole (PROTONIX) IV  40 mg Intravenous Q12H   Infusions:     . heparin 950 Units/hr (02/10/12 2217)    Assessment:  21 year old with PMH ESRD,Polycystic kidney disease, renal transplant failed admitted with chest pain and hemoptysis found to have PE diagnosed by V-Q scan and probable pulmonary infarcts (per  CT Chest). Pt s/p hemoptysis and Hgb drop, heparin restarted w/out bolus per Dr. Sherene Sires and plan to aim for lower end of goal. Hgb low again @ 10.8  Heparin level therapeutic but at upper end so will decrease, INR = 1.50  Goal of Therapy:   INR 2-3  Heparin level 0.3-0.7 units/ml   Plan:  1) Decrease heparin to 900 units/hr (15ml/hr) 2) Coumadin 7.5 mg po x 1 dose tonight 3) Follow up AM labs (CBC, HL, INR)  Ravyn Nikkel, Cristal Deer, Pharm.D. [phone] 02/11/2012 11:08 AM

## 2012-02-11 NOTE — Progress Notes (Signed)
Patient stated to nurse that she had milky discharge coming from bilateral breast, which was assessed by RN. Patient denies any pain or discomfort. Md paged.

## 2012-02-12 DIAGNOSIS — N186 End stage renal disease: Secondary | ICD-10-CM

## 2012-02-12 LAB — HEPARIN LEVEL (UNFRACTIONATED): Heparin Unfractionated: 0.47 IU/mL (ref 0.30–0.70)

## 2012-02-12 MED ORDER — WARFARIN SODIUM 7.5 MG PO TABS
7.5000 mg | ORAL_TABLET | Freq: Once | ORAL | Status: AC
  Administered 2012-02-12: 7.5 mg via ORAL
  Filled 2012-02-12: qty 1

## 2012-02-12 MED ORDER — HEPARIN SODIUM (PORCINE) 1000 UNIT/ML DIALYSIS
20.0000 [IU]/kg | INTRAMUSCULAR | Status: DC | PRN
  Filled 2012-02-12: qty 2

## 2012-02-12 NOTE — Progress Notes (Signed)
Subjective: C/o nausea this AM  Had B/L breast discharge yesterday  No further bleeding episodes   Objective: Vital signs in last 24 hours: Filed Vitals:   02/11/12 1800 02/11/12 2154 02/12/12 0446 02/12/12 0917  BP: 112/73 116/85 117/67 105/68  Pulse: 102 104 116 100  Temp: 97.6 F (36.4 C) 97.7 F (36.5 C) 97.8 F (36.6 C) 98.8 F (37.1 C)  TempSrc: Oral Oral Oral Oral  Resp: 18 18 18 18   Height:      Weight:  73.483 kg (162 lb)    SpO2: 100% 100% 100% 100%   Weight change: 3.583 kg (7 lb 14.4 oz)  Intake/Output Summary (Last 24 hours) at 02/12/12 1032 Last data filed at 02/12/12 0918  Gross per 24 hour  Intake 1060.91 ml  Output      0 ml  Net 1060.91 ml    Physical Exam: General: Awake, Oriented, No acute distress. HEENT: EOMI. Neck: Supple CV: S1 and S2, rrr Lungs: Clear to ascultation bilaterally, no wheezing Abdomen: Soft, Nontender, Nondistended, +bowel sounds. Ext: Good pulses. Trace edema.   Lab Results:  [facility] 02/10/12 0740  NA 134*  K 3.9  CL 98  CO2 24  GLUCOSE 84  BUN 35*  CREATININE 7.63*  CALCIUM 9.9  MG --  PHOS 2.8    Basename 02/10/12 0740  AST --  ALT --  ALKPHOS --  BILITOT --  PROT --  ALBUMIN 2.9*   No results found for this basename: LIPASE:2,AMYLASE:2 in the last 72 hours  Basename 02/10/12 0550 02/09/12 1800  WBC 6.1 7.1  NEUTROABS -- --  HGB 10.8* 11.7*  HCT 34.5* 37.6  MCV 89.6 90.6  PLT 113* 95*   No results found for this basename: CKTOTAL:3,CKMB:3,CKMBINDEX:3,TROPONINI:3 in the last 72 hours No components found with this basename: POCBNP:3 No results found for this basename: DDIMER:2 in the last 72 hours No results found for this basename: HGBA1C:2 in the last 72 hours No results found for this basename: CHOL:2,HDL:2,LDLCALC:2,TRIG:2,CHOLHDL:2,LDLDIRECT:2 in the last 72 hours No results found for this basename: TSH,T4TOTAL,FREET3,T3FREE,THYROIDAB in the last 72 hours No results found for this  basename: VITAMINB12:2,FOLATE:2,FERRITIN:2,TIBC:2,IRON:2,RETICCTPCT:2 in the last 72 hours  Micro Results: Recent Results (from the past 240 hour(s))  MRSA PCR SCREENING     Status: Normal   Collection Time   02/03/12  3:57 AM      Component Value Range Status Comment   MRSA by PCR NEGATIVE  NEGATIVE  Final   CLOSTRIDIUM DIFFICILE BY PCR     Status: Normal   Collection Time   02/04/12  8:56 AM      Component Value Range Status Comment   C difficile by pcr NEGATIVE  NEGATIVE  Final     Studies/Results: No results found.  Medications: I have reviewed the patient's current medications. Scheduled Meds:    . calcium acetate  1,334 mg Oral TID WC  . carvedilol  6.25 mg Oral BID WC  . darbepoetin (ARANESP) injection - DIALYSIS  40 mcg Intravenous Q Tue-HD  . docusate sodium  100 mg Oral BID  . ferric gluconate (FERRLECIT/NULECIT) IV  62.5 mg Intravenous Q Thu-HD  . isosorbide dinitrate  20 mg Oral TID  . multivitamin  1 tablet Oral QHS  . nystatin  10 mL Oral QID  . pantoprazole  40 mg Oral BID  . sodium chloride  3 mL Intravenous Q12H  . warfarin  7.5 mg Oral ONCE-1800  . warfarin  7.5 mg Oral ONCE-1800  .  warfarin   Does not apply Once  . Warfarin - Pharmacist Dosing Inpatient   Does not apply q1800  . DISCONTD: pantoprazole (PROTONIX) IV  40 mg Intravenous Q12H   Continuous Infusions:    . heparin 900 Units/hr (02/12/12 0147)   PRN Meds:.sodium chloride, acetaminophen, acetaminophen, albuterol, alum & mag hydroxide-simeth, diphenhydrAMINE, guaiFENesin-codeine, heparin, HYDROmorphone, ondansetron (ZOFRAN) IV, ondansetron, oxyCODONE, sodium chloride, sorbitol  Assessment/Plan: 21 year old with PMH ESRD,Polycystic kidney disease, renal transplant failed admitted with chest pain and hemoptysis found to have PE diagnosed by V-Q scan, ct chest ordered to evaluate for pulmonary hemorrhage, infarct. She also has chronic abdominal pain.   Pulmonary Embolism:  CT show PE, pulmonary  infarct would explain hemoptysis. Platelet decrease more than 50 %. HIT negative. Change back to heparin.  Continue anticoagulation per pharmacy protocol. start coumadin, once INR therapeutic plan to D/C patient home and return for vascular to address her access    Cardiomyopathy: ECHO show EF 20 %. Continue lisinopril. Patient was started on coreg. ESR 5, ANA, DNA double strand. Appreciate cardio help. Needs ECHO in few months to reassess EF.   Increase TSH: Free T3 normal. Free T4 mild elevated. Active form is normal. She will need repeat thyroid function test in 4 weeks.   ESRD (end stage renal disease) (02/03/2012) Renal following.   Diarrhea;  C diff negative.  Improved.   Left side lower quadrant Abdominal pain: CT negative for acute finding. Needs to follow up with PCP.   Hemoptysis- appreciate pulm's help, CT scan done shows: Lingular ground-glass airspace disease slightly decreased since  prior study. New right upper lobe ground-glass airspace disease,  presumably pulmonary infarcts in the area of the previously seen  pulmonary embolus.  Slight enlargement of the right pleural effusion which remains  small.  Cavitary rounded area within the right lower lobe measures up to 16  mm. Surrounding ground-glass opacities. Findings are stable.  Differential considerations would include infectious process versus  a cavitary lesion/malignancy. Recommend continued follow up. Hgb probably related to days of dialysis and volume pulm following  D/C once INR between 2-3   LOS: 10 days  Carnita Golob, DO 02/12/2012, 10:32 AM

## 2012-02-12 NOTE — Progress Notes (Signed)
Patient: [name] Small DOB: 1991-05-10 Date of Admission: 02/02/2012  LOS 10 days           Pulmonary Progress Note  MD requesting consult:  Dr. Benjamine Mola ([facility]) Reason for consult:  hemoptysis  HPI - 20 yobm with hx ESRD on HD r/t polycystic kidney disease who presented 4/12 with c/o chest pain and hemoptysis.  She was dx with PE and and admitted by [facility].  She was initially placed on heparin but developed thrombocytopenia and ?HIT and was changed to Argatroban 4/15.  She cont to have mod amount hemoptysis and PCCM consulted 4/17, hit panel neg and heparin restarted 4/18  Tests/studies Echo 4/14 Left ventricle: The cavity size was normal. Systolic function was severely reduced. The estimated ejection fraction was in the range of 15% to 20%. Severe diffuse hypokinesis. - Aortic valve: Mild regurgitation. - Mitral valve: Moderate regurgitation directed posteriorly. - Right atrium: The atrium was mildly dilated. - Pericardium, extracardiac: A small pericardial effusion was identified. -Lower ext dopplers  4/18 > neg bilaterally -No  Evidence of PAH CT chest 4/17: Lingular ground-glass airspace disease slightly decreased since prior study. New right upper lobe ground-glass airspace disease, presumably pulmonary infarcts in the area of the previously seen pulmonary embolus. Slight enlargement of the right pleural effusion which remains small. Cavitary rounded area within the right lower lobe measures up to 16 mm. Surrounding ground-glass opacities. Findings are stable.  Differential considerations would include infectious process versus a cavitary lesion/malignancy. Recommend continued follow up.  Overnight/subjective: Pt stable this am on HD.  No increased wob, no hemoptysis overnight.  Back on heparin iv.   Vital Signs BP 105/68  Pulse 100  Temp(Src) 98.8 F (37.1 C) (Oral)  Resp 18  Ht 5\' 3"  (1.6 m)  Wt 73.483 kg (162 lb)  BMI 28.70 kg/m2  SpO2 100%  LMP 01/17/2012   RA   EXAM: General: Cushingnoid appearing young AAF.No complaints of hemoptysis  Chest with faint basilar crackles, o/w clear Cor with rrr Abd soft, nontender, bs+ Alert and oriented, moves all 4.    Lab 02/10/12 0550 02/09/12 1800 02/09/12 0530 02/08/12 1747 02/08/12 0659  HGB 10.8* 11.7* 11.7* 14.0 11.1*    Lab 02/10/12 0550 02/09/12 1800 02/09/12 0530  HGB 10.8* 11.7* 11.7*  HCT 34.5* 37.6 37.9  WBC 6.1 7.1 7.2  PLT 113* 95* 102*    Lab 02/10/12 0740 02/08/12 0659 02/06/12 0730  NA 134* 134* 136  K 3.9 4.1 --  CL 98 98 97  CO2 24 25 25   GLUCOSE 84 83 92  BUN 35* 35* 31*  CREATININE 7.63* 7.99* 8.48*  CALCIUM 9.9 9.9 9.5  MG -- -- --  PHOS 2.8 2.9 3.7    Lab 02/12/12 0822 02/11/12 0945 02/10/12 0550 02/09/12 0530 02/08/12 1747  INR 1.90* 1.50* 1.35 1.18 1.20     IMPRESSION/ PLAN:  1. Hemoptysis  With PE moderate Clot burder on CT-- In setting PE/pulm infarct on anticoagulation with thrombocytopenia. Repeat CT 4/17 c/w infarcts only.  HIT neg 4/15 .  -  On 4/22/1 - last hemoptysis 02/11/12 am. None since then. Overall vastly improved  PLAN -Cotninue heparin  - can go home when INR > 2 and off heparin for 24h; advised this could be few more days  -need 6 months coumadin atleast but current evidence is for longer Rx  - OPD ful with Dr Marchelle Gearing  - IVC filter if bleeding issues (currently none and hemoptysis thought to be due to PE)  Dr. Kalman Shan, M.D., [facility].C.P Pulmonary and Critical Care Medicine Staff Physician [facility] Pager: [phone], If no answer or between  15:00h - 7:00h: call [phone]  02/12/2012 11:25 AM

## 2012-02-12 NOTE — Progress Notes (Signed)
ANTICOAGULATION CONSULT NOTE - Follow Up Consult  Pharmacy Consult for : Coumadin with Heparin bridging  Indication: PE (with HIT Negative Panel)  Allergies  Allergen Reactions  . Morphine And Related Itching    Patient Measurements: Height: 5\' 3"  (160 cm) Weight: 162 lb (73.483 kg) IBW/kg (Calculated) : 52.4    Vital Signs: Temp: 98.8 F (37.1 C) (04/22 0917) Temp src: Oral (04/22 0917) BP: 105/68 mmHg (04/22 0917) Pulse Rate: 100  (04/22 0917)  Labs:  Alvira Philips 02/12/12 1610 02/11/12 0945 02/10/12 0740 02/10/12 0550 02/09/12 1800  HGB -- -- -- 10.8* 11.7*  HCT -- -- -- 34.5* 37.6  PLT -- -- -- 113* 95*  APTT -- -- -- -- --  LABPROT 22.1* 18.4* -- 16.9* --  INR 1.90* 1.50* -- 1.35 --  HEPARINUNFRC 0.47 0.70 -- 0.39 --  CREATININE -- -- 7.63* -- --  CKTOTAL -- -- -- -- --  CKMB -- -- -- -- --  TROPONINI -- -- -- -- --   Estimated Creatinine Clearance: 11.3 ml/min (by C-G formula based on Cr of 7.63).   Medications:  Scheduled:     . calcium acetate  1,334 mg Oral TID WC  . carvedilol  6.25 mg Oral BID WC  . darbepoetin (ARANESP) injection - DIALYSIS  40 mcg Intravenous Q Tue-HD  . docusate sodium  100 mg Oral BID  . ferric gluconate (FERRLECIT/NULECIT) IV  62.5 mg Intravenous Q Thu-HD  . isosorbide dinitrate  20 mg Oral TID  . multivitamin  1 tablet Oral QHS  . nystatin  10 mL Oral QID  . pantoprazole  40 mg Oral BID  . sodium chloride  3 mL Intravenous Q12H  . warfarin  7.5 mg Oral ONCE-1800  . warfarin   Does not apply Once  . Warfarin - Pharmacist Dosing Inpatient   Does not apply q1800  . DISCONTD: pantoprazole (PROTONIX) IV  40 mg Intravenous Q12H   Infusions:     . heparin 900 Units/hr (02/12/12 0147)    Assessment:  21 year old with PMH ESRD,Polycystic kidney disease, renal transplant failed admitted with chest pain and hemoptysis found to have PE diagnosed by V-Q scan and probable pulmonary infarcts (per CT Chest). Pt s/p hemoptysis and Hgb  drop, heparin restarted w/out bolus per Dr. Sherene Sires and plan to aim for lower end of goal.  Heparin level therapeutic at lower end, INR = 1.90  Goal of Therapy:   INR 2-3  Heparin level 0.3-0.7 units/ml   Plan:  1) Continue heparin at 900 units/hr (71ml/hr) 2) Coumadin 7.5 mg po x 1 dose tonight 3) Follow up AM labs (CBC, HL, INR)  Elwin Sleight, Pharm.D. [phone] 02/12/2012 9:23 AM

## 2012-02-12 NOTE — Progress Notes (Signed)
[facility] Progress Note  Subjective:  Feels good. No diarrhea.   Objective Filed Vitals:   02/11/12 1800 02/11/12 2154 02/12/12 0446 02/12/12 0917  BP: 112/73 116/85 117/67 105/68  Pulse: 102 104 116 100  Temp: 97.6 F (36.4 C) 97.7 F (36.5 C) 97.8 F (36.6 C) 98.8 F (37.1 C)  TempSrc: Oral Oral Oral Oral  Resp: 18 18 18 18   Height:      Weight:  73.483 kg (162 lb)    SpO2: 100% 100% 100% 100%   Physical Exam: General: Comfortable; alert; looks well. Heart: RRR no rub Lungs: CTA without wheezes or rales Abdomen: soft NT Extremities: No LE edema Dialysis Access: right I-J; clotted right upper AVF (see #1 and 2)  Problem/Plan: 1. Bilateral pulmonary embolism - Now back on heparin/coumadin (HIT neg) IR unable to declot AVF secondary to extensive clot, question whether PE is related. Plans for anticoagulation per primary team and pulmonary, also with hypercoagulable work up. INR near therapeutic. Who is going to follow after discharge? 2. ESRD - on HD on TTS at [facility], now via [facility]; . Regarding access [facility] wants to give time for clot to epithelialize and plan on new access in the near future at which time patient will need to be again admitted for anticoagulation bridge. Plan HD first round Tuesday. 3. Anemia - Hgb 10.8 3/20 on Araensp 40 mcg on Tues (on outpatient Epo 20,000 U), weekly Fe on Thurs.  4. Secondary hyperparathyroidism - on 2 phoslo with meals, off Zemplar; P borderline. No change P intake will be higher after d/c 5. HTN/Volume - BP still lowish but assymptomatic. stopped hydralazine Thursday because redundant therapy. Continue coreg and titrate to affect given cardiomyopathy. Vasotec now stopped for cough and low BP. EDW being lowered.  77 as outpt.  76.4 upon admission and now 73.4. 6. GI- has been constipated and complaining of diarrhea this admit. Currently OK  7. Left abdominal pain - CT negative.  8. S/p failed transplant - transplant in [place] in  07/2007, failed in 08/2011 (secondary to missed Prograf), nephrectomy 09/15/11.  9.Cardiomyopathy- known and likely due to longstanding hypertension, medical management. Will eventually need repeat echo, continue coreg  10.Dispo- OK to keep patient on regular diet (probably what she eats at home) and monitor potassium and phos. Hopefully hemptysis will stay away as INR drifts out, Home when INR therapeutic.    Sheffield Slider, PA-C  [facility] [phone]  02/12/2012,11:51 AM  LOS: 10 days   Patient seen and examined and agree with assessment and plan as above.  Vinson Moselle  MD [facility] [phone] pgr    [phone] cell 02/12/2012, 1:58 PM    Additional Objective Labs: Basic Metabolic Panel:  Lab 02/10/12 7846 02/08/12 0659 02/06/12 0730  NA 134* 134* 136  K 3.9 4.1 3.4*  CL 98 98 97  CO2 24 25 25   GLUCOSE 84 83 92  BUN 35* 35* 31*  CREATININE 7.63* 7.99* 8.48*  CALCIUM 9.9 9.9 9.5  ALB -- -- --  PHOS 2.8 2.9 3.7   Liver Function Tests:  Lab 02/10/12 0740 02/08/12 0659 02/06/12 0730  AST -- -- --  ALT -- -- --  ALKPHOS -- -- --  BILITOT -- -- --  PROT -- -- --  ALBUMIN 2.9* 2.8* 2.8*   Lab Results  Component Value Date   INR 1.90* 02/12/2012   INR 1.50* 02/11/2012   INR 1.35 02/10/2012  CBC:  Lab 02/10/12  0550 02/09/12 1800 02/09/12 0530 02/08/12 1747 02/08/12 0659  WBC 6.1 7.1 7.2 -- --  NEUTROABS -- -- -- -- --  HGB 10.8* 11.7* 11.7* -- --  HCT 34.5* 37.6 37.9 -- --  MCV 89.6 90.6 90.5 90.7 90.2  PLT 113* 95* 102* -- --  Medications:    . heparin 900 Units/hr (02/12/12 0147)      . calcium acetate  1,334 mg Oral TID WC  . carvedilol  6.25 mg Oral BID WC  . darbepoetin (ARANESP) injection - DIALYSIS  40 mcg Intravenous Q Tue-HD  . docusate sodium  100 mg Oral BID  . ferric gluconate (FERRLECIT/NULECIT) IV  62.5 mg Intravenous Q Thu-HD  . isosorbide dinitrate  20 mg Oral TID  . multivitamin  1 tablet Oral QHS  .  nystatin  10 mL Oral QID  . pantoprazole  40 mg Oral BID  . sodium chloride  3 mL Intravenous Q12H  . warfarin  7.5 mg Oral ONCE-1800  . warfarin  7.5 mg Oral ONCE-1800  . warfarin   Does not apply Once  . Warfarin - Pharmacist Dosing Inpatient   Does not apply q1800    I  have reviewed scheduled and prn medications.

## 2012-02-13 ENCOUNTER — Inpatient Hospital Stay (HOSPITAL_COMMUNITY): Payer: Managed Care, Other (non HMO)

## 2012-02-13 LAB — CBC
Platelets: 168 10*3/uL (ref 150–400)
RBC: 3.65 MIL/uL — ABNORMAL LOW (ref 3.87–5.11)
RDW: 19.2 % — ABNORMAL HIGH (ref 11.5–15.5)
WBC: 5.7 10*3/uL (ref 4.0–10.5)

## 2012-02-13 LAB — HEPARIN LEVEL (UNFRACTIONATED): Heparin Unfractionated: 0.39 IU/mL (ref 0.30–0.70)

## 2012-02-13 LAB — PROTIME-INR
INR: 2.13 — ABNORMAL HIGH (ref 0.00–1.49)
Prothrombin Time: 24.2 seconds — ABNORMAL HIGH (ref 11.6–15.2)

## 2012-02-13 MED ORDER — DARBEPOETIN ALFA-POLYSORBATE 100 MCG/0.5ML IJ SOLN
100.0000 ug | INTRAMUSCULAR | Status: DC
  Administered 2012-02-13: 100 ug via INTRAVENOUS

## 2012-02-13 MED ORDER — WARFARIN SODIUM 7.5 MG PO TABS
7.5000 mg | ORAL_TABLET | Freq: Once | ORAL | Status: AC
  Administered 2012-02-13: 7.5 mg via ORAL
  Filled 2012-02-13: qty 1

## 2012-02-13 MED ORDER — DIPHENHYDRAMINE HCL 50 MG/ML IJ SOLN
12.5000 mg | INTRAMUSCULAR | Status: AC
  Administered 2012-02-13: 12.5 mg via INTRAVENOUS

## 2012-02-13 MED ORDER — DARBEPOETIN ALFA-POLYSORBATE 100 MCG/0.5ML IJ SOLN
INTRAMUSCULAR | Status: AC
  Administered 2012-02-13: 100 ug via INTRAVENOUS
  Filled 2012-02-13: qty 0.5

## 2012-02-13 NOTE — Progress Notes (Signed)
Patient: [name] Small DOB: 30-May-1991 Date of Admission: 02/02/2012  LOS 11 days           Pulmonary Progress Note  MD requesting consult:  Dr. Benjamine Mola ([facility]) Reason for consult:  hemoptysis  HPI - 20 yobm with hx ESRD on HD r/t polycystic kidney disease who presented 4/12 with c/o chest pain and hemoptysis.  She was dx with PE and and admitted by [facility].  She was initially placed on heparin but developed thrombocytopenia and ?HIT and was changed to Argatroban 4/15.  She cont to have mod amount hemoptysis and PCCM consulted 4/17, hit panel neg and heparin restarted 4/18  Tests/studies Echo 4/14 Left ventricle: The cavity size was normal. Systolic function was severely reduced. The estimated ejection fraction was in the range of 15% to 20%. Severe diffuse hypokinesis. - Aortic valve: Mild regurgitation. - Mitral valve: Moderate regurgitation directed posteriorly. - Right atrium: The atrium was mildly dilated. - Pericardium, extracardiac: A small pericardial effusion was identified. -Lower ext dopplers  4/18 > neg bilaterally -No  Evidence of PAH  CT chest 4/17: Lingular ground-glass airspace disease slightly decreased since prior study. New right upper lobe ground-glass airspace disease, presumably pulmonary infarcts in the area of the previously seen pulmonary embolus. Slight enlargement of the right pleural effusion which remains small. Cavitary rounded area within the right lower lobe measures up to 16 mm. Surrounding ground-glass opacities. Findings are stable.  Differential considerations would include infectious process versus a cavitary lesion/malignancy. Recommend continued follow up.  Overnight/subjective: Pt stable this am on HD.  No increased wob, no hemoptysis overnight.   Vital Signs BP 105/70  Pulse 93  Temp(Src) 96.2 F (35.7 C) (Oral)  Resp 30  Ht 5\' 3"  (1.6 m)  Wt 163 lb 9.3 oz (74.2 kg)  BMI 28.98 kg/m2  SpO2 100%  LMP 01/17/2012  RA   EXAM: General:  Cushingnoid appearing young AAF.No complaints of hemoptysis  Chest with decreased  bs bases, o/w clear Cor with rrr Abd soft, nontender, bs+ Alert and oriented, moves all 4.    Lab 02/13/12 0545 02/10/12 0550 02/09/12 1800 02/09/12 0530 02/08/12 1747  HGB 10.1* 10.8* 11.7* 11.7* 14.0    Lab 02/13/12 0545 02/10/12 0550 02/09/12 1800  HGB 10.1* 10.8* 11.7*  HCT 32.8* 34.5* 37.6  WBC 5.7 6.1 7.1  PLT 168 113* 95*    Lab 02/10/12 0740 02/08/12 0659  NA 134* 134*  K 3.9 4.1  CL 98 98  CO2 24 25  GLUCOSE 84 83  BUN 35* 35*  CREATININE 7.63* 7.99*  CALCIUM 9.9 9.9  MG -- --  PHOS 2.8 2.9    Lab 02/13/12 0545 02/12/12 0822 02/11/12 0945 02/10/12 0550 02/09/12 0530  INR 2.13* 1.90* 1.50* 1.35 1.18     IMPRESSION/ PLAN:  1. Hemoptysis  With PE moderate Clot burder on CT-- In setting PE/pulm infarct on anticoagulation with thrombocytopenia. Repeat CT 4/17 c/w infarcts only.  HIT neg 4/15 .  -  On 4/22/1 - last hemoptysis 02/11/12 am. None since then. Overall vastly improved  PLAN -Cotninue heparin  - can go home when INR > 2 and off heparin for 24h; this can be 02/14/12  -need 6 months coumadin atleast but current evidence is for longer Rx  - OPD ful with Dr Marchelle Gearing - Wednesday 03/13/12 at 14:00h.    - IVC filter if bleeding issues (currently none and hemoptysis thought to be due to PE)   PCCM will sign off please call if  needed.  Brett Canales Minor ACNP [facility] PCCM Pager [phone] till 3 pm If no answer page [phone] 02/13/2012, 8:47 AM    Follow-up Information    Follow up with Pcp Not In System. (sherri booth- Wed 24th at 1:45 for INR followup)          STAFF NOTE: I, Dr Lavinia Sharps have personally reviewed patient's available data, including medical history, events of note, physical examination and test results as part of my evaluation. I have discussed with resident/NP and other care providers such as pharmacist, RN and RRT.  In addition,  I personally evaluated  patient and elicited key findings of pulmonary embolism and hemoptysis. INR now > 2 and no more hemoptysis. Can go home once off heparin for 24h. PCCM fu with Dr Marchelle Gearing set up Wednesday 03/13/12 at 14:00h.  Rest per NP/medical resident whose note is outlined above and that I agree with  Dr. Kalman Shan, M.D., [facility].C.P Pulmonary and Critical Care Medicine Staff Physician [facility]monary and Critical Care Pager: [phone], If no answer or between  15:00h - 7:00h: call [phone]  02/13/2012 10:44 AM

## 2012-02-13 NOTE — Progress Notes (Signed)
[facility] Progress Note:  Plan F/U appointment in 3 weeks with Dr. Myra Gianotti to schedule pt for HD access. Our office will contact patient.  [name] Small J. 02/13/2012  3:50 PM

## 2012-02-13 NOTE — Progress Notes (Signed)
ANTICOAGULATION CONSULT NOTE - Follow Up Consult  Pharmacy Consult for : Coumadin with Heparin bridging  Indication: PE (with HIT Negative Panel)  Allergies  Allergen Reactions  . Morphine And Related Itching    Patient Measurements: Height: 5\' 3"  (160 cm) Weight: 163 lb 9.3 oz (74.2 kg) IBW/kg (Calculated) : 52.4    Vital Signs: Temp: 96.2 F (35.7 C) (04/23 0610) Temp src: Oral (04/23 0610) BP: 120/77 mmHg (04/23 0900) Pulse Rate: 96  (04/23 0900)  Labs:  Basename 02/13/12 0545 02/12/12 0822 02/11/12 0945  HGB 10.1* -- --  HCT 32.8* -- --  PLT 168 -- --  APTT -- -- --  LABPROT 24.2* 22.1* 18.4*  INR 2.13* 1.90* 1.50*  HEPARINUNFRC 0.39 0.47 0.70  CREATININE -- -- --  CKTOTAL -- -- --  CKMB -- -- --  TROPONINI -- -- --   Estimated Creatinine Clearance: 11.3 ml/min (by C-G formula based on Cr of 7.63).   Assessment:  21 year old with PMH ESRD,Polycystic kidney disease, renal transplant failed admitted with chest pain and hemoptysis found to have PE diagnosed by V-Q scan and probable pulmonary infarcts (per CT Chest). Pt s/p hemoptysis and Hgb drop  Heparin now discontinued, INR = 2.13  Goal of Therapy:   INR = 2 to 3   Plan:  1) Coumadin 7.5 mg po x 1 dose tonight 2) If home, recommend Coumadin 7.5 mg MWF, 5 mg other days 3) Follow up AM INR  Elwin Sleight, Pharm.D. [phone] 02/13/2012 9:03 AM

## 2012-02-13 NOTE — Progress Notes (Signed)
Subjective: No further bleeding episodes S/p dialysis today Eating well   Objective: Vital signs in last 24 hours: Filed Vitals:   02/13/12 0930 02/13/12 1000 02/13/12 1030 02/13/12 1046  BP: 112/63 115/77 128/82 115/76  Pulse: 89 100 90 92  Temp:      TempSrc:      Resp: 26 24  24   Height:      Weight:      SpO2:       Weight change: 0.817 kg (1 lb 12.8 oz)  Intake/Output Summary (Last 24 hours) at 02/13/12 1054 Last data filed at 02/13/12 1046  Gross per 24 hour  Intake 634.37 ml  Output   2001 ml  Net -1366.63 ml    Physical Exam: General: Awake, Oriented, No acute distress. HEENT: EOMI. Neck: Supple CV: S1 and S2, rrr Lungs: Clear to ascultation bilaterally, no wheezing Abdomen: Soft, Nontender, Nondistended, +bowel sounds. Ext: Good pulses. Trace edema.   Lab Results: No results found for this basename: NA:2,K:2,CL:2,CO2:2,GLUCOSE:2,BUN:2,CREATININE:2,CALCIUM:2,MG:2,PHOS:2 in the last 72 hours No results found for this basename: AST:2,ALT:2,ALKPHOS:2,BILITOT:2,PROT:2,ALBUMIN:2 in the last 72 hours No results found for this basename: LIPASE:2,AMYLASE:2 in the last 72 hours  Basename 02/13/12 0545  WBC 5.7  NEUTROABS --  HGB 10.1*  HCT 32.8*  MCV 89.9  PLT 168   No results found for this basename: CKTOTAL:3,CKMB:3,CKMBINDEX:3,TROPONINI:3 in the last 72 hours No components found with this basename: POCBNP:3 No results found for this basename: DDIMER:2 in the last 72 hours No results found for this basename: HGBA1C:2 in the last 72 hours No results found for this basename: CHOL:2,HDL:2,LDLCALC:2,TRIG:2,CHOLHDL:2,LDLDIRECT:2 in the last 72 hours No results found for this basename: TSH,T4TOTAL,FREET3,T3FREE,THYROIDAB in the last 72 hours No results found for this basename: VITAMINB12:2,FOLATE:2,FERRITIN:2,TIBC:2,IRON:2,RETICCTPCT:2 in the last 72 hours  Micro Results: Recent Results (from the past 240 hour(s))  CLOSTRIDIUM DIFFICILE BY PCR     Status:  Normal   Collection Time   02/04/12  8:56 AM      Component Value Range Status Comment   C difficile by pcr NEGATIVE  NEGATIVE  Final     Studies/Results: No results found.  Medications: I have reviewed the patient's current medications. Scheduled Meds:    . calcium acetate  1,334 mg Oral TID WC  . carvedilol  6.25 mg Oral BID WC  . darbepoetin (ARANESP) injection - DIALYSIS  100 mcg Intravenous Q Tue-HD  . docusate sodium  100 mg Oral BID  . ferric gluconate (FERRLECIT/NULECIT) IV  62.5 mg Intravenous Q Thu-HD  . isosorbide dinitrate  20 mg Oral TID  . multivitamin  1 tablet Oral QHS  . nystatin  10 mL Oral QID  . pantoprazole  40 mg Oral BID  . sodium chloride  3 mL Intravenous Q12H  . warfarin  7.5 mg Oral ONCE-1800  . warfarin  7.5 mg Oral ONCE-1800  . warfarin   Does not apply Once  . Warfarin - Pharmacist Dosing Inpatient   Does not apply q1800  . DISCONTD: darbepoetin (ARANESP) injection - DIALYSIS  40 mcg Intravenous Q Tue-HD   Continuous Infusions:    . DISCONTD: heparin 900 Units/hr (02/13/12 0154)   PRN Meds:.sodium chloride, acetaminophen, acetaminophen, albuterol, alum & mag hydroxide-simeth, diphenhydrAMINE, guaiFENesin-codeine, heparin, HYDROmorphone, ondansetron (ZOFRAN) IV, ondansetron, oxyCODONE, sodium chloride, sorbitol, DISCONTD: heparin  Assessment/Plan: 21 year old with PMH ESRD,Polycystic kidney disease, renal transplant failed admitted with chest pain and hemoptysis found to have PE diagnosed by V-Q scan, ct chest ordered to evaluate for pulmonary hemorrhage,  infarct. She also has chronic abdominal pain.   Pulmonary Embolism:  CT show PE, pulmonary infarct would explain hemoptysis. Platelet decrease more than 50 %. HIT negative. Changed back to heparin.  Continue anticoagulation per pharmacy protocol. start coumadin, INR therapeutic- continue coumadin- d/c heparin if INR > 2 tomm as well- D/C patient home  I arranged a INR with PCP on Friday AM at  8:30 Delfin Gant)   Cardiomyopathy: ECHO show EF 20 %. Continue lisinopril. Patient was started on coreg. ESR 5, ANA, DNA double strand. Appreciate cardio help. Needs ECHO in few months to reassess EF.    Increase TSH: Free T3 normal. Free T4 mild elevated. Active form is normal. She will need repeat thyroid function test in 4 weeks.    ESRD (end stage renal disease) (02/03/2012) Renal HD T/Th/Sat  Diarrhea;  C diff negative.  Improved.   Left side lower quadrant Abdominal pain: CT negative for acute finding. Needs to follow up with PCP.   Hemoptysis- appreciate pulm's help, CT scan done shows: Lingular ground-glass airspace disease slightly decreased since  prior study. New right upper lobe ground-glass airspace disease,  presumably pulmonary infarcts in the area of the previously seen  pulmonary embolus.  Slight enlargement of the right pleural effusion which remains  small.  Cavitary rounded area within the right lower lobe measures up to 16  mm. Surrounding ground-glass opacities. Findings are stable.  Differential considerations would include infectious process versus  a cavitary lesion/malignancy. Recommend continued follow up. Hgb probably related to days of dialysis and volume pulm following      LOS: 11 days  Juanetta Negash, DO 02/13/2012, 10:54 AM

## 2012-02-13 NOTE — Progress Notes (Signed)
[facility] Progress Note  Subjective: had an episode of feeling cold with sweats last night. (no temp spikes documented)  Objective Filed Vitals:   02/13/12 0639 02/13/12 0700 02/13/12 0730 02/13/12 0800  BP: 123/78 127/76 135/80 116/75  Pulse: 82 96 94 93  Temp:      TempSrc:      Resp: 17 20 20 23   Height:      Weight:      SpO2:       Physical Exam on HD goal 2.5 BP 116/45 P 84 General: Comfortable; alert; looks well on HD Heart: RRR no rub  Lungs: CTA without wheezes or rales  Abdomen: soft NT  Extremities: tr LE edema  Dialysis Access: right I-J; clotted right upper AVF (see #1 and 2) Qb 400  Assessment/Plan: 1. Bilateral pulmonary embolism - INR therapeutic, IV heparin stopped.  Who is going to follow coumadin after discharge?  2. ESRD / Failed RUA AVF due to thrombosis / HD via [facility] which was present on admission- on HD on TTS at [facility]; . Regarding access [facility] wants to give time for clot to epithelialize and plan on new access in the near future at which time patient will need to be again admitted for anticoagulation bridge. Renal labs pending 3. Anemia - Hgb 10.1 3/20 on Araensp 40 mcg on Tues - will increase to 100 today (on outpatient Epo 20,000 U), weekly Fe on Thurs.  4. Secondary hyperparathyroidism - on 2 phoslo with meals, off Zemplar; P borderline. No change P intake will be higher after d/c; labs pending 5. HTN/Volume - BP ok. stopped hydralazine Thursday because redundant therapy. Continue coreg and titrate to affect given cardiomyopathy. Vasotec now stopped for cough and low BP. EDW being lowered. 77 as outpt. 76.4 upon admission; evaluate post HD today  6. GI- has been constipated and complaining of diarrhea this admit. Currently OK  7. Left abdominal pain - CT negative.  8. S/p failed renal transplant - transplant in [place] in 07/2007, failed in 08/2011 (secondary to missed Prograf), nephrectomy 09/15/11.  9.Cardiomyopathy- known and likely due to  longstanding hypertension, medical management. Will eventually need repeat echo, continue coreg  10.Dispo- OK to keep patient on regular diet- have discussed high K foods to avoid (probably what she eats at home) and monitor potassium and phos.   Additional Objective Labs: Basic Metabolic Panel:  Lab 02/10/12 7829 02/08/12 0659  NA 134* 134*  K 3.9 4.1  CL 98 98  CO2 24 25  GLUCOSE 84 83  BUN 35* 35*  CREATININE 7.63* 7.99*  CALCIUM 9.9 9.9  ALB -- --  PHOS 2.8 2.9   Lab Results  Component Value Date   INR 2.13* 02/13/2012   INR 1.90* 02/12/2012   INR 1.50* 02/11/2012    Liver Function Tests:  Lab 02/10/12 0740 02/08/12 0659  AST -- --  ALT -- --  ALKPHOS -- --  BILITOT -- --  PROT -- --  ALBUMIN 2.9* 2.8*  CBC:  Lab 02/13/12 0545 02/10/12 0550 02/09/12 1800 02/09/12 0530 02/08/12 1747  WBC 5.7 6.1 7.1 -- --  NEUTROABS -- -- -- -- --  HGB 10.1* 10.8* 11.7* -- --  HCT 32.8* 34.5* 37.6 -- --  MCV 89.9 89.6 90.6 90.5 90.7  PLT 168 113* 95* -- --   Medications:    . DISCONTD: heparin 900 Units/hr (02/13/12 0154)      . calcium acetate  1,334 mg Oral TID WC  . carvedilol  6.25 mg Oral BID WC  . darbepoetin (ARANESP) injection - DIALYSIS  40 mcg Intravenous Q Tue-HD  . docusate sodium  100 mg Oral BID  . ferric gluconate (FERRLECIT/NULECIT) IV  62.5 mg Intravenous Q Thu-HD  . isosorbide dinitrate  20 mg Oral TID  . multivitamin  1 tablet Oral QHS  . nystatin  10 mL Oral QID  . pantoprazole  40 mg Oral BID  . sodium chloride  3 mL Intravenous Q12H  . warfarin  7.5 mg Oral ONCE-1800  . warfarin   Does not apply Once  . Warfarin - Pharmacist Dosing Inpatient   Does not apply q1800    I  have reviewed scheduled and prn medications.  Sheffield Slider, PA-C [facility] Beeper [phone]  02/13/2012,8:30 AM  LOS: 11 days   Patient seen and examined and agree with assessment and plan as above.  Vinson Moselle  MD [employer] [phone]  pgr    [phone] cell 02/13/2012, 1:34 PM

## 2012-02-14 LAB — PROTIME-INR: Prothrombin Time: 24.4 seconds — ABNORMAL HIGH (ref 11.6–15.2)

## 2012-02-14 MED ORDER — WARFARIN SODIUM 5 MG PO TABS: 5.0000 mg | ORAL_TABLET | Freq: Every day | ORAL | Status: DC

## 2012-02-14 MED ORDER — CARVEDILOL 6.25 MG PO TABS: 6.2500 mg | ORAL_TABLET | Freq: Two times a day (BID) | ORAL | Status: DC

## 2012-02-14 MED ORDER — RENA-VITE PO TABS: 1.0000 | ORAL_TABLET | Freq: Every day | ORAL | Status: DC

## 2012-02-14 MED ORDER — WARFARIN SODIUM 5 MG PO TABS
5.0000 mg | ORAL_TABLET | Freq: Once | ORAL | Status: DC
  Filled 2012-02-14: qty 1

## 2012-02-14 NOTE — Discharge Summary (Signed)
Physician Discharge Summary  Patient ID: [name] Small MRN: [id number] DOB/AGE: [birth year] 21 y.o.  Admit date: 02/02/2012 Discharge date: 02/14/2012  Primary Care Physician:  Pcp Not In System Dr. Delfin Gant.  Discharge Diagnoses:    Patient Active Problem List  Diagnoses  . ESRD (end stage renal disease)  . HTN (hypertension)  . PE (pulmonary embolism)  . Abdominal pain  . Pulmonary infarct  . Pneumonia, organism unspecified  . Hemoptysis    Medication List  As of 02/14/2012 12:28 PM   TAKE these medications         calcium acetate 667 MG capsule   Commonly known as: PHOSLO   Take 667 mg by mouth 3 (three) times daily with meals.      carvedilol 6.25 MG tablet   Commonly known as: COREG   Take 1 tablet (6.25 mg total) by mouth 2 (two) times daily with a meal.      enalapril 10 MG tablet   Commonly known as: VASOTEC   Take 10 mg by mouth daily.      hydrALAZINE 25 MG tablet   Commonly known as: APRESOLINE   Take 25 mg by mouth 2 (two) times daily with a meal.      isosorbide dinitrate 20 MG tablet   Commonly known as: ISORDIL   Take 20 mg by mouth 3 (three) times daily.      multivitamin Tabs tablet   Take 1 tablet by mouth at bedtime.      oxyCODONE-acetaminophen 5-325 MG per tablet   Commonly known as: PERCOCET   Take 1-2 tablets by mouth every 4 (four) hours as needed. For pain      promethazine 25 MG tablet   Commonly known as: PHENERGAN   Take 25 mg by mouth every 6 (six) hours as needed.      warfarin 5 MG tablet   Commonly known as: COUMADIN   Take 1 tablet (5 mg total) by mouth daily.             Disposition and Follow-up:  Follow up with primary MD, Dr Delfin Gant. Follow up with Dr Kalman Shan, pulmonologist. Follow up with Dr Sharyn Lull, Cardiologist.  Consults:  cardiology, pulmonary/intensive care, nephrology and vascular surgery  Dr Koren Bound, Pulmonologist. Dr Rinaldo Cloud, cardiologist. Dr Malen Gauze,  Nephrologist. Dr [name], Vascular surgeon.  Significant Diagnostic Studies:  Nm Pulmonary Per & Vent  02/02/2012  *RADIOLOGY REPORT*  Clinical Data: Shortness of breath, cough, hemoptysis  NM PULMONARY VENTILATION AND PERFUSION SCAN  Radiopharmaceutical: CURIE xenon xe 133 gas 20 milli Curie XENON XE 133 GAS, CURIE MAA TECHNETIUM TO 77M ALBUMIN AGGREGATED  Comparison: Correlation with chest radiograph dated 02/02/2012  Findings: Ventilation images demonstrate decreased ventilation to the left lower lobe.  Perfusion images demonstrate a segmental filling defect to the anterior portion of the left lower lobe.  Additional possible subsegmental filling defect to the anterior right lower lobe.  Chest radiograph demonstrates mild cardiomegaly but no corresponding pulmonary opacities.  This appearance corresponds to a high probability for pulmonary embolism.  IMPRESSION: High probability for pulmonary embolism.  Critical Value/emergent results were called by telephone at the time of interpretation on 02/02/2012  at 2345 hours  to  Dr. Effie Shy, who verbally acknowledged these results.  Original Report Authenticated By: Charline Bills, M.D.   Dg Chest Port 1 View  02/02/2012  *RADIOLOGY REPORT*  Clinical Data: Coughing up blood with abdominal pain  PORTABLE CHEST - 1 VIEW  Comparison: None.  Findings: There is a dialysis catheter from right IJ approach. Cardiac silhouette is enlarged.  There is mild central venous congestion.  No effusion, infiltrate, or pneumothorax.  IMPRESSION: Cardiomegaly  with mild central venous congestion.  Original Report Authenticated By: Genevive Bi, M.D.    Brief H and P: For complete details, refer to admission H and P. However, in brief, this is a 21 y.o. Female, with known history of history of Polycystic kidney disease, ESRD secondary to PCKD And PD starting May 2008 and [facility] October 2008 with transplant failure November  2012 due to missed Prograf for 6 weeks. She had a transplant nephrectomy 09/15/2011 in [place] by a Dr. Katherina Right. She presented with a 24 hour history of chest pain and hemoptysisand hemoptysis, and had undertaken a 3 hour beach trip 3 weeks prior. She was admitted for further evaluation, investigation and management.   Physical Exam: On 02/14/12.  General:   Alert, communicative, fully oriented, talking in complete sentences, not short of breath at rest.  HEENT:  Mild clinical pallor, no jaundice, no conjunctival injection or discharge. Hydration status is fair. NECK:  Supple, JVP not seen, no carotid bruits, no palpable lymphadenopathy, no palpable goiter. CHEST:  Clinically clear to auscultation, no wheezes, no crackles. HEART:  Sounds 1 and 2 heard, normal, regular, no murmurs. ABDOMEN:  Full, soft, non-tender, no palpable organomegaly, no palpable masses, normal bowel sounds. GENITALIA:  Not examined. LOWER EXTREMITIES:  No pitting edema, palpable peripheral pulses. MUSCULOSKELETAL SYSTEM:  Unremarkable.. CENTRAL NERVOUS SYSTEM:  No focal neurologic deficit on gross examination.  Hospital Course:  1. Pulmonary Embolism:  This was confirmed by V/Q scan and chest CT, which showed PE/pulmonary infarct. Patient was managed with Heparin anticoagulation, as well as concomitant Coumadin, with resolution of chest pain and improvement in hemoptysis. As of 02/14/12, INr was therapeutic x 48 hours, and Heparin was  dicontinued. Patient will follow up with her primary MD, for PT/INR check. An appointment has been arranged with Dr Delfin Gant on Friday 02/16/12, at 8:30 AM. 2. Cardiomyopathy: ECHO showed EF 20 %. Continue lisinopril. Patient was started on Coreg. Cardiology consultation was provided by Dr Rinaldo Cloud. Clinically, patient showed no eveidence of decompensation, during this hospitalization. She will need repeat ECHO in few months to reassess EF.  3. Dysthyroidism: Patient was found  incidentally, to have a TSH of 10.00, with free tT4 of 2.10 and free T3 of 2.7 : Free T3 normal. Free T4 mild elevated. We shall defer folow up, to primary MD. 4. ESRD (end stage renal disease) (02/03/2012): Patient was placed on her regular dialysis schedule, by the renal team. Consultation was provided by Dr Malen Gauze. Dr [name] provided vascular surgical consultation. According to him, patient will need new access. Vein mapping indicates that she would be a candidate for left arm BVT vs cephalic vein fistula. There is mention of thrombus within the cephalic vein at the elbow. This will need to be evaluated in the OR and she may benefit from having this procedure at a later date once she has recovered from her PE. 5. Diarrhea:   This was transient. C diff PCR was negative.  6. Hemoptysis: This was secondary to PE/Pulmonary infarcts. CT scan done showed lingular ground-glass airspace disease slightly decreased since prior study. New right upper lobe ground-glass airspace disease, presumably pulmonary infarcts in the area of the previously seen pulmonary embolus. Cavitary rounded area within the right lower lobe measures up  to 16  mm. Surrounding ground-glass opacities. Findings are stable. Pulmonary consultation was provided by Dr Koren Bound, and patient was subsequently seen by Dr Kalman Shan. Patient will be followed up by Dr Marchelle Gearing, as outpatient.  Time spent on Discharge: 45 mins  Signed: Nickoles Gregori,CHRISTOPHER 02/14/2012, 12:28 PM

## 2012-02-14 NOTE — Progress Notes (Signed)
ANTICOAGULATION CONSULT NOTE - Follow Up Consult  Pharmacy Consult for : Coumadin with Heparin bridging  Indication: PE (with HIT Negative Panel)  Allergies  Allergen Reactions  . Morphine And Related Itching    Patient Measurements: Height: 5\' 3"  (160 cm) Weight: 157 lb 10.1 oz (71.5 kg) IBW/kg (Calculated) : 52.4    Vital Signs: Temp: 98.4 F (36.9 C) (04/24 0805) Temp src: Oral (04/24 0805) BP: 105/75 mmHg (04/24 0805) Pulse Rate: 144  (04/24 0805)  Labs:  [name] Small 02/14/12 0535 02/13/12 0545 02/12/12 0822 02/11/12 0945  HGB -- 10.1* -- --  HCT -- 32.8* -- --  PLT -- 168 -- --  APTT -- -- -- --  LABPROT 24.4* 24.2* 22.1* --  INR 2.15* 2.13* 1.90* --  HEPARINUNFRC -- 0.39 0.47 0.70  CREATININE -- -- -- --  CKTOTAL -- -- -- --  CKMB -- -- -- --  TROPONINI -- -- -- --   Estimated Creatinine Clearance: 11.1 ml/min (by C-G formula based on Cr of 7.63).   Assessment:  21 year old with PMH ESRD,Polycystic kidney disease, renal transplant failed admitted with chest pain and hemoptysis found to have PE diagnosed by V-Q scan and probable pulmonary infarcts (per CT Chest). Pt s/p hemoptysis and Hgb drop  Heparin now discontinued, INR = 2.15  Goal of Therapy:   INR = 2 to 3   Plan:  1) Coumadin 5 mg po x 1 dose tonight if not home 2) If home, recommend Coumadin 5 mg MWF, 7.5 mg other days 3) Follow up AM INR  Elwin Sleight, Pharm.D. [phone] 02/14/2012 9:07 AM

## 2012-02-14 NOTE — Progress Notes (Signed)
[facility] Progress Note  Subjective: Had another episode of feeling cold with sweats last night. (no temp spikes documented) . Otherwise, feels fine. No cough. Breathing ok. No problems with HD Tuesday.  Objective Filed Vitals:   02/13/12 1945 02/14/12 0449 02/14/12 0805 02/14/12 1000  BP: 90/52 112/48 105/75 104/63  Pulse: 96 94 144 94  Temp: 97.8 F (36.6 C) 97.9 F (36.6 C) 98.4 F (36.9 C) 98 F (36.7 C)  TempSrc: Oral Oral Oral Oral  Resp: 20 20 20 20   Height:      Weight: 71.5 kg (157 lb 10.1 oz)     SpO2: 95% 99% 99% 100%   Physical Exam: General: Comfortable sitting in bed Heart: RRR no rub  Lungs: CTA without wheezes or rales  Abdomen: soft NT  Extremities: tr LE edema  Dialysis Access: right I-J  Assessment/Plan: 1. Bilateral pulmonary embolism - INR therapeutic, IV heparin stopped. PCP Dr. Jerre Simon with [facility] will manage. INR therapeutic  2. ESRD / Failed RUA AVF due to thrombosis / HD via [facility] which was present on admission- on HD on TTS at [facility]; . Regarding access [facility] wants to give time for clot to epithelialize and plan on new access in the near future at which time patient will need to be again admitted for anticoagulation bridge. Has f/u appt with Dr. Myra Gianotti in 3 weeks;  3. Anemia - Hgb 10.1 4/23  - will increased to 100 4/23 (on outpatient Epo 20,000 U), weekly Fe on Thurs.  4. Secondary hyperparathyroidism - on 2 phoslo with meals, off Zemplar; P borderline. No change P intake will be higher after d/c; labs pending  5. HTN/Volume - BP ok. stopped hydralazine Thursday because redundant therapy. Continue coreg and titrate to affect given cardiomyopathy. Vasotec now stopped for cough and low BP. EDW being lowered. 77 as outpt. 76.4 upon admission;  Post HD wt 71.5; BP much lower; will make new EDW 72 6. GI- has been constipated and complaining of diarrhea this admit. Currently OK  7. Left abdominal pain - CT negative.  8. S/p failed  renal transplant - transplant in [place] in 07/2007, failed in 08/2011 (secondary to missed Prograf), nephrectomy 09/15/11.  9.Cardiomyopathy- known and likely due to longstanding hypertension, medical management. Will eventually need repeat echo, continue coreg  10.Dispo- OK for discharge per renal; ok to keep patient on regular diet- have discussed high vitamin K foods to avoid  and monitor potassium and phos. Patient for discharge home today per primary svc.  Additional Objective Labs: Basic Metabolic Panel:  Lab 02/10/12 4098 02/08/12 0659  NA 134* 134*  K 3.9 4.1  CL 98 98  CO2 24 25  GLUCOSE 84 83  BUN 35* 35*  CREATININE 7.63* 7.99*  CALCIUM 9.9 9.9  ALB -- --  PHOS 2.8 2.9   Liver Function Tests:  Lab 02/10/12 0740 02/08/12 0659  AST -- --  ALT -- --  ALKPHOS -- --  BILITOT -- --  PROT -- --  ALBUMIN 2.9* 2.8*   Lab Results  Component Value Date   INR 2.15* 02/14/2012   INR 2.13* 02/13/2012   INR 1.90* 02/12/2012    CBC:  Lab 02/13/12 0545 02/10/12 0550 02/09/12 1800 02/09/12 0530 02/08/12 1747  WBC 5.7 6.1 7.1 -- --  NEUTROABS -- -- -- -- --  HGB 10.1* 10.8* 11.7* -- --  HCT 32.8* 34.5* 37.6 -- --  MCV 89.9 89.6 90.6 90.5 90.7  PLT 168 113*  95* -- --   Blood Culture    Component Value Date/Time   SDES URINE, CLEAN CATCH 10/24/2009 1145   SPECREQUEST NONE 10/24/2009 1145   CULT  Value: KLEBSIELLA PNEUMONIAE ESCHERICHIA COLI 10/24/2009 1145   REPTSTATUS 10/26/2009 FINAL 10/24/2009 1145    Cardiac Enzymes: No results found for this basename: CKTOTAL:5,CKMB:5,CKMBINDEX:5,TROPONINI:5 in the last 168 hours CBG: No results found for this basename: GLUCAP:5 in the last 168 hours Iron Studies: No results found for this basename: IRON,TIBC,TRANSFERRIN,FERRITIN in the last 72 hours Studies/Results: No results found. Medications:      . calcium acetate  1,334 mg Oral TID WC  . carvedilol  6.25 mg Oral BID WC  . darbepoetin (ARANESP) injection - DIALYSIS  100  mcg Intravenous Q Tue-HD  . diphenhydrAMINE  12.5 mg Intravenous NOW  . docusate sodium  100 mg Oral BID  . ferric gluconate (FERRLECIT/NULECIT) IV  62.5 mg Intravenous Q Thu-HD  . isosorbide dinitrate  20 mg Oral TID  . multivitamin  1 tablet Oral QHS  . nystatin  10 mL Oral QID  . pantoprazole  40 mg Oral BID  . sodium chloride  3 mL Intravenous Q12H  . warfarin  5 mg Oral ONCE-1800  . warfarin  7.5 mg Oral ONCE-1800  . warfarin   Does not apply Once  . Warfarin - Pharmacist Dosing Inpatient   Does not apply q1800    I  have reviewed scheduled and prn medications.  Sheffield Slider, PA-C  [facility] [phone]  02/14/2012,10:58 AM  LOS: 12 days   Patient seen and examined and agree with assessment and plan as above with additions in bold.   Vinson Moselle  MD [facility] [phone] pgr    [phone] cell 02/14/2012, 1:03 PM

## 2012-02-14 NOTE — Progress Notes (Signed)
Pt AVS reviewed with pt. Pt able to verbalize understanding of AVS instructions and has no questions. Pt remains stable. RX for coumadin and oxycodone given to pt along with AVS instructions. Ambulatory pt escorted down to short stay. [place], Rosanna Randy

## 2012-02-20 ENCOUNTER — Encounter (HOSPITAL_COMMUNITY): Payer: Self-pay | Admitting: Internal Medicine

## 2012-02-20 ENCOUNTER — Inpatient Hospital Stay (HOSPITAL_COMMUNITY)
Admission: AD | Admit: 2012-02-20 | Discharge: 2012-02-27 | DRG: 166 | Disposition: A | Discharge status: Home / Self Care | Payer: Managed Care, Other (non HMO) | Source: Other Acute Inpatient Hospital | Attending: Internal Medicine | Admitting: Internal Medicine

## 2012-02-20 ENCOUNTER — Inpatient Hospital Stay (HOSPITAL_COMMUNITY): Payer: Managed Care, Other (non HMO)

## 2012-02-20 DIAGNOSIS — R791 Abnormal coagulation profile: Secondary | ICD-10-CM | POA: Diagnosis present

## 2012-02-20 DIAGNOSIS — F329 Major depressive disorder, single episode, unspecified: Secondary | ICD-10-CM | POA: Diagnosis present

## 2012-02-20 DIAGNOSIS — I2699 Other pulmonary embolism without acute cor pulmonale: Principal | ICD-10-CM | POA: Diagnosis present

## 2012-02-20 DIAGNOSIS — T45515A Adverse effect of anticoagulants, initial encounter: Secondary | ICD-10-CM | POA: Diagnosis present

## 2012-02-20 DIAGNOSIS — N186 End stage renal disease: Secondary | ICD-10-CM | POA: Diagnosis present

## 2012-02-20 DIAGNOSIS — N2581 Secondary hyperparathyroidism of renal origin: Secondary | ICD-10-CM | POA: Diagnosis present

## 2012-02-20 DIAGNOSIS — Z9885 Transplanted organ removal status: Secondary | ICD-10-CM

## 2012-02-20 DIAGNOSIS — I509 Heart failure, unspecified: Secondary | ICD-10-CM | POA: Diagnosis present

## 2012-02-20 DIAGNOSIS — F3289 Other specified depressive episodes: Secondary | ICD-10-CM | POA: Diagnosis present

## 2012-02-20 DIAGNOSIS — Z515 Encounter for palliative care: Secondary | ICD-10-CM

## 2012-02-20 DIAGNOSIS — IMO0001 Reserved for inherently not codable concepts without codable children: Secondary | ICD-10-CM | POA: Diagnosis present

## 2012-02-20 DIAGNOSIS — Z79899 Other long term (current) drug therapy: Secondary | ICD-10-CM

## 2012-02-20 DIAGNOSIS — Z9119 Patient's noncompliance with other medical treatment and regimen: Secondary | ICD-10-CM

## 2012-02-20 DIAGNOSIS — E8881 Metabolic syndrome: Secondary | ICD-10-CM | POA: Diagnosis present

## 2012-02-20 DIAGNOSIS — Z992 Dependence on renal dialysis: Secondary | ICD-10-CM | POA: Diagnosis present

## 2012-02-20 DIAGNOSIS — Z7901 Long term (current) use of anticoagulants: Secondary | ICD-10-CM

## 2012-02-20 DIAGNOSIS — I428 Other cardiomyopathies: Secondary | ICD-10-CM | POA: Diagnosis present

## 2012-02-20 DIAGNOSIS — Z905 Acquired absence of kidney: Secondary | ICD-10-CM

## 2012-02-20 DIAGNOSIS — D649 Anemia, unspecified: Secondary | ICD-10-CM | POA: Diagnosis present

## 2012-02-20 DIAGNOSIS — Z87891 Personal history of nicotine dependence: Secondary | ICD-10-CM

## 2012-02-20 DIAGNOSIS — I10 Essential (primary) hypertension: Secondary | ICD-10-CM

## 2012-02-20 DIAGNOSIS — I5022 Chronic systolic (congestive) heart failure: Secondary | ICD-10-CM | POA: Diagnosis present

## 2012-02-20 DIAGNOSIS — Z885 Allergy status to narcotic agent status: Secondary | ICD-10-CM

## 2012-02-20 DIAGNOSIS — R0781 Pleurodynia: Secondary | ICD-10-CM | POA: Diagnosis present

## 2012-02-20 DIAGNOSIS — J189 Pneumonia, unspecified organism: Secondary | ICD-10-CM

## 2012-02-20 DIAGNOSIS — D696 Thrombocytopenia, unspecified: Secondary | ICD-10-CM | POA: Diagnosis not present

## 2012-02-20 DIAGNOSIS — Q613 Polycystic kidney, unspecified: Secondary | ICD-10-CM

## 2012-02-20 DIAGNOSIS — Z91199 Patient's noncompliance with other medical treatment and regimen due to unspecified reason: Secondary | ICD-10-CM

## 2012-02-20 DIAGNOSIS — I5023 Acute on chronic systolic (congestive) heart failure: Secondary | ICD-10-CM | POA: Diagnosis present

## 2012-02-20 DIAGNOSIS — M949 Disorder of cartilage, unspecified: Secondary | ICD-10-CM | POA: Diagnosis present

## 2012-02-20 DIAGNOSIS — M899 Disorder of bone, unspecified: Secondary | ICD-10-CM | POA: Diagnosis present

## 2012-02-20 DIAGNOSIS — I959 Hypotension, unspecified: Secondary | ICD-10-CM | POA: Diagnosis present

## 2012-02-20 HISTORY — DX: End stage renal disease: N18.6

## 2012-02-20 HISTORY — DX: Heart failure, unspecified: I50.9

## 2012-02-20 HISTORY — DX: Other pulmonary embolism without acute cor pulmonale: I26.99

## 2012-02-20 LAB — PROTIME-INR: INR: 1.56 — ABNORMAL HIGH (ref 0.00–1.49)

## 2012-02-20 MED ORDER — RENA-VITE PO TABS
1.0000 | ORAL_TABLET | Freq: Every day | ORAL | Status: DC
  Administered 2012-02-21 – 2012-02-26 (×5): 1 via ORAL
  Filled 2012-02-20 (×7): qty 1

## 2012-02-20 MED ORDER — HEPARIN (PORCINE) IN NACL 100-0.45 UNIT/ML-% IJ SOLN
850.0000 [IU]/h | INTRAMUSCULAR | Status: DC
  Administered 2012-02-20: 1100 [IU]/h via INTRAVENOUS
  Administered 2012-02-21: 900 [IU]/h via INTRAVENOUS
  Administered 2012-02-25: 850 [IU]/h via INTRAVENOUS
  Filled 2012-02-20 (×7): qty 250

## 2012-02-20 MED ORDER — IPRATROPIUM BROMIDE 0.02 % IN SOLN: 0.5000 mg | Freq: Four times a day (QID) | RESPIRATORY_TRACT | Status: DC | PRN

## 2012-02-20 MED ORDER — SODIUM CHLORIDE 0.9 % IJ SOLN
3.0000 mL | Freq: Two times a day (BID) | INTRAMUSCULAR | Status: DC
  Administered 2012-02-20 – 2012-02-25 (×9): 3 mL via INTRAVENOUS

## 2012-02-20 MED ORDER — ACETAMINOPHEN 650 MG RE SUPP: 650.0000 mg | Freq: Four times a day (QID) | RECTAL | Status: DC | PRN

## 2012-02-20 MED ORDER — ONDANSETRON HCL 4 MG/2ML IJ SOLN
4.0000 mg | Freq: Four times a day (QID) | INTRAMUSCULAR | Status: DC | PRN
  Administered 2012-02-21 (×2): 4 mg via INTRAVENOUS
  Filled 2012-02-20 (×2): qty 2

## 2012-02-20 MED ORDER — HYDROMORPHONE HCL PF 1 MG/ML IJ SOLN
0.5000 mg | INTRAMUSCULAR | Status: DC | PRN
  Administered 2012-02-20 – 2012-02-21 (×4): 0.5 mg via INTRAVENOUS
  Filled 2012-02-20 (×5): qty 1

## 2012-02-20 MED ORDER — WARFARIN SODIUM 5 MG PO TABS: 5.0000 mg | ORAL_TABLET | Freq: Every day | ORAL | Status: DC

## 2012-02-20 MED ORDER — ALBUTEROL SULFATE (5 MG/ML) 0.5% IN NEBU: 2.5000 mg | INHALATION_SOLUTION | Freq: Four times a day (QID) | RESPIRATORY_TRACT | Status: DC | PRN

## 2012-02-20 MED ORDER — SODIUM CHLORIDE 0.9 % IV BOLUS (SEPSIS)
500.0000 mL | Freq: Once | INTRAVENOUS | Status: AC
  Administered 2012-02-20: 500 mL via INTRAVENOUS

## 2012-02-20 MED ORDER — ONDANSETRON HCL 4 MG PO TABS: 4.0000 mg | ORAL_TABLET | Freq: Four times a day (QID) | ORAL | Status: DC | PRN

## 2012-02-20 MED ORDER — CALCIUM ACETATE 667 MG PO CAPS
667.0000 mg | ORAL_CAPSULE | Freq: Three times a day (TID) | ORAL | Status: DC
  Administered 2012-02-21 – 2012-02-26 (×15): 667 mg via ORAL
  Filled 2012-02-20 (×22): qty 1

## 2012-02-20 MED ORDER — ACETAMINOPHEN 325 MG PO TABS: 650.0000 mg | ORAL_TABLET | Freq: Four times a day (QID) | ORAL | Status: DC | PRN

## 2012-02-20 NOTE — H&P (Addendum)
PCP:  Elvia Collum, MD [facility], Dr. Althea Charon Dr. Kerby Nora, cardiology at [facility]  After last discharge, was set up to see:  Follow up with Dr Kalman Shan, pulmonologist. Follow up with Dr Sharyn Lull, Cardiologist.   Chief Complaint:  Pleuritic chest pain  HPI: [name] with h/o PCKD, ESRD on HD, s/p cadaveric renal transplant 07/2007 and transplant  failure 08/2011, then transplant nephrectomy 08/2011, and recent admission for bilateral PE with  pulmonary infarct and ? cavitary lesion due to necrosis in 01/2012, on coumadin, now transferred  from [facility] where she presented with pleuritic chest pain and dyspnea with INR  1.5.   Pt was last admitted to [facility] 4/13 with chest pain and hemoptysis. Found on admission to have  V/Q scan with high probability for PE, bilaterally and pulmonary infarct. She was on heparin  bridge to coumadin. Echo showed EF 20%, seen by Dr. Sharyn Lull, started coreg, continue  lisinopril, plan repeat echo in a few months. Seen by vascular surgery for evaluation of access, and  was dialyzed regularly by renal. Found on CT to have cavitary rounded area in RLL up to 16mm,  seen by pulmonary. Discharged home.   Pt was admitted to [facility] on 4/29 with chest pain worse with inspiration, noted to  have mild hypoxemia. She was started on Heparin drip and started higher doses of coumadin,  INR was 1.9 on transfer to [facility]. Pt endorses mid sternal chest pressure, no radiation, no associated  symptoms, and also sharp pain that is worse when she breathes that is located in her high upper  right back. She reports some dyspnea on the night she was admitted to [facility] but seems better  now.   On arrival, pt's BP noted to be 84/63 which she states her baseline is usually HTN, and after  asked she does state some dizziness with positional change. ROS as above, otherwise with some  diarrhea, some RLQ pain where she states she had an abscess  drained the week before her [facility]  admission at [facility]. Otherwise, negative all other systems. Endorses coumadin compliance.    Past Medical History  Diagnosis Date  . 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 Hemodialysis patient   . Hypertension   . Polycystic kidney disease   . Pulmonary emboli 01/2012    Bilateral, moderate clot burden, areas of pulmonary infarction and central necrosis    Past Surgical History  Procedure Date  . Nephrectomy   . Av fistula placement   . Kidney transplant 2008    failed    Medications:  HOME MEDS: Pending pharmacy reconciliation Prior to Admission medications   Medication Sig Start Date End Date Taking? Authorizing Provider  calcium acetate (PHOSLO) 667 MG capsule Take 667 mg by mouth 3 (three) times daily with meals.     Yes Historical Provider, MD  carvedilol (COREG) 6.25 MG tablet Take 1 tablet (6.25 mg total) by mouth 2 (two) times daily with a meal. 02/14/12 02/13/13 Yes Laveda Norman, MD  enalapril (VASOTEC) 10 MG tablet Take 10 mg by mouth daily.   Yes Historical Provider, MD  hydrALAZINE (APRESOLINE) 25 MG tablet Take 25 mg by mouth 2 (two) times daily with a meal.   Yes Historical Provider, MD  isosorbide dinitrate (ISORDIL) 20 MG tablet Take 20 mg by mouth 3 (three) times daily.   Yes Historical Provider, MD  multivitamin (RENA-VIT) TABS tablet Take 1 tablet by mouth at bedtime. 02/14/12  Yes Laveda Norman, MD  oxyCODONE-acetaminophen (PERCOCET) 5-325 MG per tablet Take 1-2 tablets by mouth every 4 (four) hours as needed. For pain    Yes Historical Provider, MD  promethazine (PHENERGAN) 25 MG tablet Take 25 mg by mouth every 6 (six) hours as needed. For nausea and vomiting    Yes Historical Provider, MD  warfarin (COUMADIN) 5 MG tablet Take 1 tablet (5 mg total) by mouth daily. 02/14/12 02/13/13 Yes Laveda Norman, MD    Allergies:  Allergies  Allergen  Reactions  . Morphine And Related Itching    Social History:   reports that she has quit smoking. Her smoking use included Cigarettes. She smoked .2 packs per day. She does not have any smokeless tobacco history on file. She reports that she does not drink alcohol or use illicit drugs.  Family History: Family History  Problem Relation Age of Onset  . Polycystic kidney disease Father     Physical Exam: Filed Vitals:   02/20/12 1930 02/20/12 2131  BP: 84/63   Pulse: 67   Temp: 98 F (36.7 C)   TempSrc: Oral   Resp: 20   Height:  5\' 3"  (1.6 m)  Weight:  71.5 kg (157 lb 10.1 oz)  SpO2: 100%    Blood pressure 84/63, pulse 67, temperature 98 F (36.7 C), temperature source Oral, resp. rate 20, height 5\' 3"  (1.6 m), weight 71.5 kg (157 lb 10.1 oz), last menstrual period 01/17/2012, SpO2 100.00%.  Gen: Young, short, thin F in no distress, appears quite stable and asymptomatic at present,  breathing comfortably, speaking full sentences, no increased WOB or accessory muscles. Able to  relate history well HEENT: Pupils round, reactive, equal, sclera clear and normal appearing, EOMI, mouth moist  and normal, dentition normal has braces on Lungs: Quite CTAB no w/c/r, good air movement, no adventitious sounds, normal exam Heart: Regular  S1/2 heard, no m/g appreciated, overall normal exam audibly Abd: Soft, some TTP in the RLQ where she states she previously had abscess, but not rigid or  peritoneal Extrem: Warm, perfusing normally, normal bulk and tone, no BLE edmea noted, normal exam Neuro: Alert, attentive conversant, CN 2-12 intact, speech normal, moves extremities on her own,  Grossly nonfocal.     At [facility]:  WBC 9.8 Hgb 11.7 Plts 253 INR 1.54  133   96   48 -------------------< 90 4.5   21    10.85  Ruled out with Troponin x3 and negative CK and MB x3   ECG: NSR normal axis, 75 bpm, PR prolongation, normal PR, narrow QRS, no frank ST  deviations, but TWI  in all anterior leads, I, and diffuse TWF inferior and aVL.    Impression Present on Admission:  .ESRD (end stage renal disease) .PE (pulmonary embolism) .Pulmonary infarct .Hypotension .Pleuritic chest pain  [name] with h/o PCKD, ESRD on HD, s/p cadaveric renal transplant 07/2007 and transplant  failure 08/2011, then transplant nephrectomy 08/2011, and recent admission for bilateral PE with  pulmonary infarct and ? cavitary lesion due to necrosis in 01/2012, on coumadin, now transferred  from [facility] where she presented with pleuritic chest pain and dyspnea with INR  1.5.   1. Pleuritic chest pain: Suspect PE extension due to subtherapeutic INR. She endorses coumadin  compliance. Has ruled out by enzymes and despite cardiomyopathy, the nature of her pain seems  more consistent with pulmonary  etiology.   - Will start with chest x-ray but if pt's symptoms don't improve when her INR improves, would  consider chest CT non contrast to evaluate for progression of pulmonary infarcts or ? cavitary  lesion previously noted. See no role to evaluate with CT angio or repeat V/Q as this wouldn't  change management anyways  - Continue heparin drip and coumadin. Last coags were INR 1.9 and PTT > 200 at 4a on 4/30, so  will repeat this now and adjust accordingly  - Consider Pulm consult   2. Hypotension: Pt seems to be tolerating this pretty well, although does have some mild  positional dizziness. This was not mentioned during her course in [facility] and I  cannot quickly find any vitals, however she states she usually runs high which is consistent with  ESRD and PCKD. Consider medication effect given recently started coreg, and also on 4 agents, vs  hypovolemia. More ominous would be poor LV outpt from the PE's.  - 500 cc NS bolus challenge and trending. Low threshold to send to SDU pt pt appears quite  stable at present.  - Holding coreg, ACEi, hydralazine, ISDN.   3.  ESRD on HD: Will alert renal.  - Continue renavite, Ca acetate  Therapeutic anticoagulation DVT prophy Telemetry, [facility] team 4. Low threshold to send to SDU, pt appears pretty stable at this time Presumed full code.   Other plans as per orders.  Texie Tupou 02/20/2012, 10:15 PM

## 2012-02-20 NOTE — Progress Notes (Signed)
ANTICOAGULATION CONSULT NOTE - Initial Consult  Pharmacy Consult for Heparin Indication: pulmonary embolus  Allergies  Allergen Reactions  . Morphine And Related Itching    Patient Measurements: Height: 5\' 3"  (160 cm) Weight: 157 lb 10.1 oz (71.5 kg) IBW/kg (Calculated) : 52.4  Heparin Dosing Weight: 67.3  Vital Signs: Temp: 98 F (36.7 C) (04/30 1930) Temp src: Oral (04/30 1930) BP: 84/63 mmHg (04/30 1930) Pulse Rate: 67  (04/30 1930)  Labs: No results found for this basename: HGB:2,HCT:3,PLT:3,APTT:3,LABPROT:3,INR:3,HEPARINUNFRC:3,CREATININE:3,CKTOTAL:3,CKMB:3,TROPONINI:3 in the last 72 hours Estimated Creatinine Clearance: 11.1 ml/min (by C-G formula based on Cr of 7.63).  Medical History: Past Medical History  Diagnosis Date  . 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 Hemodialysis patient   . Hypertension   . Polycystic kidney disease   . Pulmonary emboli 01/2012    Bilateral, moderate clot burden, areas of pulmonary infarction and central necrosis   Assessment: 21 yo F transferred from [facility] on heparin bridge to Coumadin.  Has a history of recent PEs diagnosed 4/13.  Heparin was turned off for a period of time and was restarted at 7.5 ml/hr (=375 units/hr of 50 units/ml heparin bag).  Patient already received dose of Coumadin 5 mg at [facility] tonight at 1700, INR this morning at [facility] was 1.95.  Patient has ESRD.  Goal of Therapy:  INR 2-3 Heparin level 0.3-0.7   Plan:  1.  Increase heparin infusion to 1100 units/hr 2.  Check 8 hour heparin level 3.  Daily heparin level, CBC, PT/INR 4.  No Coumadin this evening  Rolland Porter, [place].D., BCPS Clinical Pharmacist Pager: [phone] 02/20/2012,10:14 PM

## 2012-02-21 ENCOUNTER — Encounter (HOSPITAL_COMMUNITY): Payer: Self-pay | Admitting: Cardiology

## 2012-02-21 DIAGNOSIS — I517 Cardiomegaly: Secondary | ICD-10-CM

## 2012-02-21 DIAGNOSIS — I959 Hypotension, unspecified: Secondary | ICD-10-CM

## 2012-02-21 DIAGNOSIS — Q613 Polycystic kidney, unspecified: Secondary | ICD-10-CM

## 2012-02-21 DIAGNOSIS — I5023 Acute on chronic systolic (congestive) heart failure: Secondary | ICD-10-CM | POA: Diagnosis present

## 2012-02-21 DIAGNOSIS — N186 End stage renal disease: Secondary | ICD-10-CM

## 2012-02-21 DIAGNOSIS — I10 Essential (primary) hypertension: Secondary | ICD-10-CM

## 2012-02-21 LAB — COMPREHENSIVE METABOLIC PANEL
Albumin: 3.9 g/dL (ref 3.5–5.2)
BUN: 20 mg/dL (ref 6–23)
Chloride: 98 mEq/L (ref 96–112)
Creatinine, Ser: 6.8 mg/dL — ABNORMAL HIGH (ref 0.50–1.10)
Total Bilirubin: 0.4 mg/dL (ref 0.3–1.2)

## 2012-02-21 LAB — CBC
HCT: 38.5 % (ref 36.0–46.0)
Hemoglobin: 13.3 g/dL (ref 12.0–15.0)
Hemoglobin: 12 g/dL (ref 12.0–15.0)
MCH: 27.9 pg (ref 26.0–34.0)
MCHC: 31.7 g/dL (ref 30.0–36.0)
MCV: 89.5 fL (ref 78.0–100.0)
RBC: 4.3 MIL/uL (ref 3.87–5.11)
RDW: 20.1 % — ABNORMAL HIGH (ref 11.5–15.5)

## 2012-02-21 LAB — DIFFERENTIAL
Basophils Absolute: 0.1 10*3/uL (ref 0.0–0.1)
Basophils Relative: 1 % (ref 0–1)
Monocytes Relative: 7 % (ref 3–12)
Neutro Abs: 4 10*3/uL (ref 1.7–7.7)
Neutrophils Relative %: 50 % (ref 43–77)

## 2012-02-21 LAB — MAGNESIUM: Magnesium: 2 mg/dL (ref 1.5–2.5)

## 2012-02-21 LAB — PROTIME-INR: Prothrombin Time: 19.7 seconds — ABNORMAL HIGH (ref 11.6–15.2)

## 2012-02-21 LAB — PHOSPHORUS: Phosphorus: 3.9 mg/dL (ref 2.3–4.6)

## 2012-02-21 MED ORDER — SORBITOL 70 % SOLN
30.0000 mL | Status: DC | PRN
  Administered 2012-02-23: 30 mL via ORAL
  Filled 2012-02-21 (×3): qty 30

## 2012-02-21 MED ORDER — ZOLPIDEM TARTRATE 5 MG PO TABS: 5.0000 mg | ORAL_TABLET | Freq: Every evening | ORAL | Status: DC | PRN

## 2012-02-21 MED ORDER — CALCIUM CARBONATE 1250 MG/5ML PO SUSP
500.0000 mg | Freq: Four times a day (QID) | ORAL | Status: DC | PRN
  Filled 2012-02-21: qty 5

## 2012-02-21 MED ORDER — HEPARIN SODIUM (PORCINE) 1000 UNIT/ML DIALYSIS
20.0000 [IU]/kg | INTRAMUSCULAR | Status: DC | PRN
  Administered 2012-02-27: 1400 [IU] via INTRAVENOUS_CENTRAL
  Filled 2012-02-21: qty 2

## 2012-02-21 MED ORDER — ACETAMINOPHEN 325 MG PO TABS
650.0000 mg | ORAL_TABLET | Freq: Four times a day (QID) | ORAL | Status: DC | PRN
  Administered 2012-02-21: 650 mg via ORAL
  Filled 2012-02-21: qty 2

## 2012-02-21 MED ORDER — ACETAMINOPHEN 650 MG RE SUPP: 650.0000 mg | Freq: Four times a day (QID) | RECTAL | Status: DC | PRN

## 2012-02-21 MED ORDER — ONDANSETRON HCL 4 MG/2ML IJ SOLN
4.0000 mg | Freq: Four times a day (QID) | INTRAMUSCULAR | Status: DC | PRN
  Administered 2012-02-22: 4 mg via INTRAVENOUS
  Filled 2012-02-21: qty 2

## 2012-02-21 MED ORDER — DOCUSATE SODIUM 283 MG RE ENEM
1.0000 | ENEMA | RECTAL | Status: DC | PRN
  Filled 2012-02-21: qty 1

## 2012-02-21 MED ORDER — CAMPHOR-MENTHOL 0.5-0.5 % EX LOTN
1.0000 "application " | TOPICAL_LOTION | Freq: Three times a day (TID) | CUTANEOUS | Status: DC | PRN
  Administered 2012-02-22: 1 via TOPICAL
  Filled 2012-02-21 (×2): qty 222

## 2012-02-21 MED ORDER — DIPHENHYDRAMINE HCL 12.5 MG/5ML PO ELIX
12.5000 mg | ORAL_SOLUTION | Freq: Once | ORAL | Status: AC | PRN
  Administered 2012-02-21: 12.5 mg via ORAL
  Filled 2012-02-21: qty 5

## 2012-02-21 MED ORDER — NEPRO/CARBSTEADY PO LIQD
237.0000 mL | Freq: Three times a day (TID) | ORAL | Status: DC | PRN
  Filled 2012-02-21: qty 237

## 2012-02-21 MED ORDER — ONDANSETRON HCL 4 MG PO TABS: 4.0000 mg | ORAL_TABLET | Freq: Four times a day (QID) | ORAL | Status: DC | PRN

## 2012-02-21 MED ORDER — WARFARIN - PHARMACIST DOSING INPATIENT: Freq: Every day | Status: DC

## 2012-02-21 MED ORDER — HYDROXYZINE HCL 25 MG PO TABS
25.0000 mg | ORAL_TABLET | Freq: Three times a day (TID) | ORAL | Status: DC | PRN
  Administered 2012-02-22 – 2012-02-23 (×3): 25 mg via ORAL
  Filled 2012-02-21 (×2): qty 1

## 2012-02-21 MED ORDER — WARFARIN SODIUM 7.5 MG PO TABS
7.5000 mg | ORAL_TABLET | Freq: Once | ORAL | Status: AC
  Administered 2012-02-21: 7.5 mg via ORAL
  Filled 2012-02-21: qty 1

## 2012-02-21 NOTE — Progress Notes (Signed)
  Echocardiogram 2D Echocardiogram has been performed.  Cathie Beams Deneen 02/21/2012, 12:21 PM

## 2012-02-21 NOTE — Progress Notes (Addendum)
Subjective: C/o 10/10 chest pain  Deep , anterior, retrosternal,      Physical Exam: Blood pressure 100/63, pulse 71, temperature 98.1 F (36.7 C), temperature source Oral, resp. rate 18, height 5\' 3"  (1.6 m), weight 71.5 kg (157 lb 10.1 oz), last menstrual period 01/17/2012, SpO2 100.00%. Patient Vitals for the past 24 hrs:  BP Temp Temp src Pulse Resp SpO2 Height Weight  02/21/12 0948 100/63 mmHg - - 71  - - - -  02/21/12 0506 123/96 mmHg - - 87  - - - -  02/21/12 0503 109/75 mmHg - - 83  - - - -  02/21/12 0501 116/74 mmHg - - 76  - - - -  02/21/12 0500 115/68 mmHg 98.1 F (36.7 C) Oral 71  18  100 % - -  02/20/12 2234 116/73 mmHg - - - - - - -  02/20/12 2131 - - - - - - 5\' 3"  (1.6 m) 71.5 kg (157 lb 10.1 oz)  02/20/12 1930 84/63 mmHg 98 F (36.7 C) Oral 67  20  100 % - -   axox3 CVS: RRR, S3 gallop, fixed S2Spilt  RS: CTAB Abdomen : soft, NT   Investigations:  No results found for this or any previous visit (from the past 240 hour(s)).   Basic Metabolic Panel:  Basename 02/20/12 2320  NA 139  K 4.3  CL 98  CO2 24  GLUCOSE 85  BUN 20  CREATININE 6.80*  CALCIUM 10.5  MG 2.0  PHOS 3.9   Liver Function Tests:  Basename 02/20/12 2320  AST 35  ALT 17  ALKPHOS 71  BILITOT 0.4  PROT 8.7*  ALBUMIN 3.9     CBC:  Basename 02/21/12 0655 02/20/12 2320  WBC 7.7 8.1  NEUTROABS -- 4.0  HGB 12.0 13.3  HCT 38.5 41.9  MCV 89.5 90.3  PLT 241 217    Dg Chest Port 1 View  02/20/2012  *RADIOLOGY REPORT*  Clinical Data: Chest pain  PORTABLE CHEST - 1 VIEW  Comparison: 02/07/2012 CT  Findings: Cardiomegaly.  Mild lingular opacity.  The cavitary lesion in the right lower lobe is poorly visualized by radiograph. No pneumothorax.  No pleural effusion.  Large bore right IJ catheter tip projects over the proximal SVC.  No acute osseous abnormality.  IMPRESSION: Cardiomegaly.  Mild lingular opacity.  Original Report Authenticated By: Waneta Martins, M.D.    Results  for [name] Small, [name] Small (MRN [id number]) as of 02/21/2012 10:25  Ref. Range 02/20/2012 23:20 02/21/2012 06:55  Heparin Unfractionated Latest Range: 0.30-0.70 IU/mL  0.60  Prothrombin Time Latest Range: 11.6-15.2 seconds 19.0 (H) 19.7 (H)  INR Latest Range: 0.00-1.49  1.56 (H) 1.64 (H)  aPTT Latest Range: 24-37 seconds 36 198 (H)    Medications:  Scheduled:    . calcium acetate  667 mg Oral TID WC  . multivitamin  1 tablet Oral QHS  . sodium chloride  500 mL Intravenous Once  . sodium chloride  3 mL Intravenous Q12H  . warfarin  7.5 mg Oral ONCE-1800  . Warfarin - Pharmacist Dosing Inpatient   Does not apply q1800  . DISCONTD: warfarin  5 mg Oral Daily   Continuous:    . heparin 1,100 Units/hr (02/20/12 2329)   WUJ:[id number], acetaminophen, albuterol, diphenhydrAMINE, HYDROmorphone, ipratropium, ondansetron (ZOFRAN) IV, ondansetron  Impression:  Principal Problem:  *Pleuritic chest pain Active Problems:  ESRD (end stage renal disease)  PE (pulmonary embolism)  Pulmonary infarct  Hypotension  Systolic CHF, chronic  Plan: Ashby Dawes of the chest pain is unclear - ? Due to pulm HTN. I am unsure that she had yet another PE> We will get 2 D echo to measure pulm artery pressure Heparin and Coumadin She did not follow up with PCP for INR checks post DC Cannot tolerate BP meds anymore       LOS: 1 day   Boone Gear, MD Pager: [phone] 02/21/2012, 10:27 AM   pager [phone]

## 2012-02-21 NOTE — Consult Note (Signed)
[facility] Renal Consultation Note    Indication for Consultation:  Management of ESRD/hemodialysis; anemia, hypertension/volume and secondary hyperparathyroidism  HPI: [name] Small is a 21 y.o. female with ESRD secondary to PCKD s/p failed kidney transplant with reinitiation to HD last November. She moved back to [place] from [place] at that time to live with her mother. She was recently d/c 4/24 with a newly diagnosed PE (VQ scan and chest CT) with planned f/u for INR monitoring with Dr. Delfin Gant, which she freely admits to not doing.  She has an EF of 20% which was evaluated by Dr. Sharyn Lull and incidentally found to have a TSH of 10 with a free T4 of 2.1 (slight ^) and T3 of 2.7 .She also had a cavitary lesion in the RLL ~42mm of ?? significants and RUL pulmoary infarcts.  Yesterday she presented to [facility] 4/29 with pleuritic CP, SOB and INR of 1.5. She was dialyzed there Tuesday per routine and started on a heparin drip with coumadin dosing.The patient requested transfer to [facility]. She tells me she just had an [place] (2D Echo) of her heart.  She is having anterior bilateral upper CP 8/10 at rest; mid upper back pain with deep inspiration while talking on the phone and eating lunch. Wonders when she is going home. States she did fill coumadin Rx after discharge and asked why there were only #5 pills.  Past Medical History  Diagnosis Date  . 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 Hemodialysis patient   . Hypertension   . Polycystic kidney disease   . Pulmonary emboli 01/2012    Bilateral, moderate clot burden, areas of pulmonary infarction and central necrosis  . CHF (congestive heart failure)   . Cardiomyopathy    Past Surgical History  Procedure Date  . Nephrectomy   . Av fistula placement   . Kidney transplant 2008    failed   Family History    Problem Relation Age of Onset  . Polycystic kidney disease Father    Social History:  reports that she has quit smoking. Her smoking use included Cigarettes. She smoked .2 packs per day. She does not have any smokeless tobacco history on file. She reports that she does not drink alcohol or use illicit drugs. Allergies  Allergen Reactions  . Morphine And Related Itching   Prior to Admission medications   Medication Sig Start Date End Date Taking? Authorizing Provider  calcium acetate (PHOSLO) 667 MG capsule Take 667 mg by mouth 3 (three) times daily with meals.     Yes Historical Provider, MD  carvedilol (COREG) 6.25 MG tablet Take 1 tablet (6.25 mg total) by mouth 2 (two) times daily with a meal. 02/14/12 02/13/13 Yes Laveda Norman, MD  enalapril (VASOTEC) 10 MG tablet Take 10 mg by mouth daily.   Yes Historical Provider, MD  hydrALAZINE (APRESOLINE) 25 MG tablet Take 25 mg by mouth 2 (two) times daily with a meal.   Yes Historical Provider, MD  isosorbide dinitrate (ISORDIL) 20 MG tablet Take 20 mg by mouth 3 (three) times daily.   Yes Historical Provider, MD  multivitamin (RENA-VIT) TABS tablet Take 1 tablet by mouth at bedtime. 02/14/12  Yes Laveda Norman, MD  oxyCODONE-acetaminophen (PERCOCET) 5-325 MG per tablet Take 1-2 tablets by mouth every 4 (four) hours as needed. For pain    Yes Historical Provider, MD  promethazine (PHENERGAN) 25 MG tablet Take 25 mg by mouth every 6 (six) hours as needed. For nausea and vomiting    Yes Historical Provider, MD  warfarin (COUMADIN) 5 MG tablet Take 1 tablet (5 mg total) by mouth daily. 02/14/12 02/13/13 Yes Laveda Norman, MD   Current Facility-Administered Medications  Medication Dose Route Frequency Provider Last Rate Last Dose  . acetaminophen (TYLENOL) tablet 650 mg  650 mg Oral Q6H PRN Devonne Doughty, MD       Or  . acetaminophen (TYLENOL) suppository 650 mg  650 mg Rectal Q6H PRN Devonne Doughty, MD      . albuterol (PROVENTIL) (5 MG/ML) 0.5% nebulizer  solution 2.5 mg  2.5 mg Nebulization Q6H PRN Devonne Doughty, MD      . calcium acetate (PHOSLO) capsule 667 mg  667 mg Oral TID WC Devonne Doughty, MD   667 mg at 02/21/12 1305  . diphenhydrAMINE (BENADRYL) 12.5 MG/5ML elixir 12.5 mg  12.5 mg Oral Once PRN Devonne Doughty, MD   12.5 mg at 02/21/12 0639  . heparin ADULT infusion 100 units/mL (25000 units/250 mL)  1,100 Units/hr Intravenous Continuous Elonda Husky, PHARMD 11 mL/hr at 02/20/12 2329 1,100 Units/hr at 02/20/12 2329  . HYDROmorphone (DILAUDID) injection 0.5 mg  0.5 mg Intravenous Q4H PRN Devonne Doughty, MD   0.5 mg at 02/21/12 0536  . ipratropium (ATROVENT) nebulizer solution 0.5 mg  0.5 mg Nebulization Q6H PRN Devonne Doughty, MD      . multivitamin (RENA-VIT) tablet 1 tablet  1 tablet Oral QHS Devonne Doughty, MD      . ondansetron (ZOFRAN) tablet 4 mg  4 mg Oral Q6H PRN Devonne Doughty, MD       Or  . ondansetron [facility]) injection 4 mg  4 mg Intravenous Q6H PRN Devonne Doughty, MD   4 mg at 02/21/12 1305  . sodium chloride 0.9 % bolus 500 mL  500 mL Intravenous Once Devonne Doughty, MD   500 mL at 02/20/12 2211  . sodium chloride 0.9 % injection 3 mL  3 mL Intravenous Q12H Devonne Doughty, MD   3 mL at 02/21/12 1000  . warfarin (COUMADIN) tablet 7.5 mg  7.5 mg Oral ONCE-1800 Dannielle Karvonen [place], [place]      . Warfarin - Pharmacist Dosing Inpatient   Does not apply q1800 Cleon Dew, PHARMD      . DISCONTD: warfarin (COUMADIN) tablet 5 mg  5 mg Oral Daily Devonne Doughty, MD       Labs: Basic Metabolic Panel:  Lab 02/20/12 4098  NA 139  K 4.3  CL 98  CO2 24  GLUCOSE 85  BUN 20  CREATININE 6.80*  CALCIUM 10.5  ALB --  PHOS 3.9   Liver Function Tests:  Lab 02/20/12 2320  AST 35  ALT 17  ALKPHOS 71  BILITOT 0.4  PROT 8.7*  ALBUMIN 3.9  CBC:  Lab 02/21/12 0655 02/20/12 2320  WBC 7.7 8.1  NEUTROABS -- 4.0  HGB 12.0 13.3  HCT 38.5 41.9  MCV 89.5 90.3  PLT 241 217   Lab Results  Component Value Date   INR 1.64* 02/21/2012    INR 1.56* 02/20/2012   INR 2.15* 02/14/2012   Studies/Results: Dg Chest Port 1 View  02/20/2012  *RADIOLOGY REPORT*  Clinical Data: Chest pain  PORTABLE CHEST - 1 VIEW  Comparison: 02/07/2012 CT  Findings: Cardiomegaly.  Mild lingular opacity.  The cavitary lesion in the right lower lobe is poorly visualized by radiograph. No pneumothorax.  No pleural effusion.  Large bore right IJ catheter tip projects over the proximal SVC.  No acute osseous abnormality.  IMPRESSION: Cardiomegaly.  Mild lingular opacity.  Original Report Authenticated By: Waneta Martins, M.D.    ROS: As per HPI plus: nausea before [facility] adm.  Makes urine about 3 x daily without dysuria or urgency; no abdominal pain or SOB, occ dry nonproductive cough, appetite good, no dizziness otherwise negative.   Physical Exam: Filed Vitals:   02/21/12 0506 02/21/12 0948 02/21/12 1135 02/21/12 1300  BP: 123/96 100/63 101/71 116/79  Pulse: 87 71  80  Temp:    98.1 F (36.7 C)  TempSrc:      Resp:    20  Height:      Weight:      SpO2:    95%     General: Well developed, well nourished,in no acute distress despite reported 8/10 pain. Head: Normocephalic, atraumatic, sclera non-icteric, mucus membranes are moist Neck: Supple. JVD not elevated. Lungs: Clear bilaterally to auscultation without wheezes, rales, or rhonchi. Breathing is unlabored. Heart: RRR with S1 S2. No murmurs, rubs, or gallops appreciated. Abdomen: Soft, non-tender, non-distended with normoactive bowel sounds. No rebound/guarding. No obvious abdominal masses. M-S:  Strength and tone appear normal for age. Lower extremities:without edema or ischemic changes, no open wounds  Neuro: Alert and oriented X 3. Moves all extremities spontaneously. Psych:  Responds to questions; with a normal affect; however, reported level of pain not consistent with exam or presentation Dialysis Access: right I-J clotted right upper AVF  Dialysis Orders: Center: AF on TTS  EDW  62.5 (should have been higher; wrong wt given at last hosp d/c; Post wt 68.5 4/25; 70 4/27. Bath 2K 2.25 Ca  Time4 Heparintigght  Accessright I-J  BFR 400 DFRA 1.5 Zemplar none mcg IV/HD Epogen10,000   Units IV/HD  Venofer 100 q 2 weeks  Assessment/Plan: 1. CP/pleurtic with hx recent PE (PE dx 4/12); d/c 4/24 with therapeutic INR with failure to f/u for INR monitoring; sats ok; reported pain out of proportion to exam 2.  ESRD -  Continue TTS routine; has clotted right upper AVF; f/u with Dr. Myra Gianotti in about 2 weeks; will have to undergo an anticoagulation bridge when next access is placed; K was 6.6 on her monthly labs; she had been d/c on a regular diet with caution regarding avoiding ^ K foods; this apparently has not been working; needs follow up after d/c 3.  Hypertension/volume  - off all BP meds; re-eval edw; 4.  Anemia  - Hgb 12 no ESA; on 100 Venofer q 2 weeks; hold for now 5.  Metabolic bone disease -  zemplar not needed iPTH 63; Ca borderline elevated, will use a 2 Ca bath here 6.  Nutrition she was discharged on a regular diet last admission with caution to avoid ^ K and ^ P foods; wouldn't eat hospital diet; give renal diet this admission. 7.    CM - longstanding hypertension; 2 D Echo repeated today. 8.   S/p failed renal tx - secondary to missed prograf; tx 10/28; tx nephrectomy 11/12 9.   Hx Medical noncompliance - needs close supervision after discharge;  Likely that at least some lack of insight is a maturity issue 10. Elevated TSH with normal - normal free T3 and mildly ^free T4 ? Significance  Sheffield Slider, PA-C [facility]  Beeper [phone] 02/21/2012, 2:16 PM

## 2012-02-21 NOTE — Progress Notes (Signed)
Patient complaining of nausea that spontaneously resolved on its' own about 2 hours earlier. Patient given 4 mg of iv Zofran. Patient now comfortably resting. Pain has ceased.  Will continue to monitor patient.

## 2012-02-21 NOTE — Progress Notes (Signed)
Patient complaining of itching all over, but scratching arms and legs. No visible hives, or shortness of breath. Dr. Kaylyn Layer notified. 12.5 mg oral elixir of Benadryl ordered, and administered. Patient now comfortably resting. Will continue to monitor patient.

## 2012-02-21 NOTE — Progress Notes (Signed)
Utilization Review Completed.Clinten Howk T5/10/2011   

## 2012-02-21 NOTE — Consult Note (Signed)
I have seen and examined this patient and agree with the assessment/plan as outlined above by [facility] PA. Ongoing eval/management of CP (appears clinically likely to be from recent PE as noted by Dr. Lavera Guise). Will continue to provide ESRD care with HD tomorrow. Corleen Otwell K.,MD 02/21/2012 3:17 PM

## 2012-02-21 NOTE — Progress Notes (Signed)
ANTICOAGULATION CONSULT NOTE - Follow Up Consult  Pharmacy Consult for Heparin and Coumadin Indication: Recent PE  Allergies  Allergen Reactions  . Morphine And Related Itching    Patient Measurements: Height: 5\' 3"  (160 cm) Weight: 157 lb 10.1 oz (71.5 kg) IBW/kg (Calculated) : 52.4  Heparin Dosing Weight: 67.3kg  Vital Signs: Temp: 98.1 F (36.7 C) (05/01 0500) Temp src: Oral (05/01 0500) BP: 123/96 mmHg (05/01 0506) Pulse Rate: 87  (05/01 0506)  Labs:  Basename 02/21/12 0655 02/20/12 2320  HGB 12.0 13.3  HCT 38.5 41.9  PLT 241 217  APTT 198* 36  LABPROT 19.7* 19.0*  INR 1.64* 1.56*  HEPARINUNFRC 0.60 --  CREATININE -- 6.80*  CKTOTAL -- --  CKMB -- --  TROPONINI -- --   Estimated Creatinine Clearance: 12.5 ml/min (by C-G formula based on Cr of 6.8).   Medications:  Heparin 1100 units/hr  Assessment: 20yof on heparin bridging to Coumadin for recent PE and subtherapeutic INR. Heparin level (0.6) is therapeutic - will plan to check follow-up level in 8 hours to confirm therapeutic. INR (1.64) is subtherapeutic this AM. Per [facility] records, INR was 1.95 on 4/30 AM but was 1.56 on admission to [facility] 20 hours later - not sure reason for large discrepancy. Patient reports taking Coumadin 5mg  daily at home and dose was not increased prior to transfer to [facility]. During initial admission at [facility] for PE, patient had been receiving 7.5mg  daily to reach therapeutic levels - will increase dose tonight.  - H/H and Plts wnl - RN reports minimal bleeding from IV site, no other significant bleeding  Goal of Therapy:  INR 2-3 Heparin level 0.3-0.7 units/ml   Plan:  1. Continue heparin 1100 units/hr (11 ml/hr) 2. Check heparin level @ 1600 to confirm therapeutic 3. Increase Coumadin to 7.5mg  po x 1 today 4. Continue daily PT/INR, heparin level and CBC  Cleon Dew 02/21/2012,9:01 AM

## 2012-02-21 NOTE — Progress Notes (Signed)
Around 2040, patient c/o sharp chest pain on left side radiating to mid back with increased discomfort with deep breaths.  Patient also nauseous. Vitals - afebrile, BP 84/63, P 67, 02 100 RA. EKG showed SR with 1st degree AV block. Dr. Kaylyn Layer, and Maren Reamer, NP notified. Dilaudid 0.5mg  ordered iv, and Zofran 4mg  iv ordered. 500 mg iv bolus of NS ordered. BP increased to 116/73 post 500 cc bolus. Dilaudid 0.5 mg administered iv. Will continue to monitor patient.

## 2012-02-22 ENCOUNTER — Inpatient Hospital Stay (HOSPITAL_COMMUNITY): Payer: Managed Care, Other (non HMO)

## 2012-02-22 DIAGNOSIS — I959 Hypotension, unspecified: Secondary | ICD-10-CM

## 2012-02-22 DIAGNOSIS — I10 Essential (primary) hypertension: Secondary | ICD-10-CM

## 2012-02-22 DIAGNOSIS — N186 End stage renal disease: Secondary | ICD-10-CM

## 2012-02-22 DIAGNOSIS — Q613 Polycystic kidney, unspecified: Secondary | ICD-10-CM

## 2012-02-22 LAB — CBC
MCH: 27.3 pg (ref 26.0–34.0)
MCHC: 31.7 g/dL (ref 30.0–36.0)
MCV: 86.1 fL (ref 78.0–100.0)
Platelets: 218 10*3/uL (ref 150–400)
RDW: 15.1 % (ref 11.5–15.5)

## 2012-02-22 LAB — PROTIME-INR
INR: 1.31 (ref 0.00–1.49)
Prothrombin Time: 16.5 seconds — ABNORMAL HIGH (ref 11.6–15.2)

## 2012-02-22 MED ORDER — ACETAMINOPHEN 500 MG PO TABS
500.0000 mg | ORAL_TABLET | Freq: Three times a day (TID) | ORAL | Status: DC
  Administered 2012-02-23 – 2012-02-26 (×4): 500 mg via ORAL
  Filled 2012-02-22 (×19): qty 1

## 2012-02-22 MED ORDER — HYDROMORPHONE HCL PF 1 MG/ML IJ SOLN
INTRAMUSCULAR | Status: AC
  Administered 2012-02-22: 0.25 mg via INTRAVENOUS
  Filled 2012-02-22: qty 1

## 2012-02-22 MED ORDER — WARFARIN SODIUM 7.5 MG PO TABS
7.5000 mg | ORAL_TABLET | ORAL | Status: AC
  Administered 2012-02-22: 7.5 mg via ORAL
  Filled 2012-02-22 (×2): qty 1

## 2012-02-22 MED ORDER — HYDROXYZINE HCL 25 MG PO TABS
ORAL_TABLET | ORAL | Status: AC
  Administered 2012-02-22: 25 mg via ORAL
  Filled 2012-02-22: qty 1

## 2012-02-22 MED ORDER — HYDROMORPHONE HCL PF 1 MG/ML IJ SOLN
0.2500 mg | INTRAMUSCULAR | Status: DC | PRN
  Administered 2012-02-22 – 2012-02-24 (×14): 0.25 mg via INTRAVENOUS
  Filled 2012-02-22 (×10): qty 1

## 2012-02-22 NOTE — Progress Notes (Signed)
Chaplain's Note: 20:20 Visited pt per referral from previous chaplain regarding pt support.  Pt had witnessed a code intervention with another pt across from her during dialysis today.  Pt had 2 friends with her in rm and sitting at bedside.  Offered to be available if she wanted to talk.  Pt thanked me for stopping in.  Will follow-up as needed or requested.

## 2012-02-22 NOTE — Progress Notes (Signed)
Patient ID: [name] Small, female   DOB: May 27, 1991, 20 y.o.   MRN: [id number]   [facility] Progress Note    Subjective:   Reports some chest pain overnight- pleuritic in nature. Denies SOB   Objective:   BP 105/64  Pulse 84  Temp(Src) 98.7 F (37.1 C) (Oral)  Resp 19  Ht 5\' 3"  (1.6 m)  Wt 71.5 kg (157 lb 10.1 oz)  BMI 27.92 kg/m2  SpO2 100%  LMP 01/17/2012  Physical Exam: JYN:WGNFAOZHYQM sleeping in bed. Meal tray untouched. VHQ:IONGE RRR, Normal S1 with loud S2 Resp:Coarse BS bilaterally, no rales/rhonchi XBM:WUXL, flat, non-tender, BS normal KGM:WNUUV LE edema.   Labs: BMET  Lab 02/20/12 2320  NA 139  K 4.3  CL 98  CO2 24  GLUCOSE 85  BUN 20  CREATININE 6.80*  ALB --  CALCIUM 10.5  PHOS 3.9   CBC  Lab 02/22/12 0514 02/21/12 0655 02/20/12 2320  WBC 12.2* 7.7 8.1  NEUTROABS -- -- 4.0  HGB 11.8* 12.0 13.3  HCT 37.2 38.5 41.9  MCV 86.1 89.5 90.3  PLT 218 241 217    2D ECHO: Mildly dilated LV with global hypokinesis, EF 20%. Mildly dilated RV with mildly decreased systolic function. Elevated PA diastolic pressure   Medications:      . calcium acetate  667 mg Oral TID WC  . multivitamin  1 tablet Oral QHS  . sodium chloride  3 mL Intravenous Q12H  . warfarin  7.5 mg Oral ONCE-1800  . Warfarin - Pharmacist Dosing Inpatient   Does not apply q1800     Assessment/ Plan:   1. CP/pleurtic with hx recent PE: Mainly pleuritic and associated with recent PE- ongoing anticoagulation/pain control. A large anxiety component noted as well. 2. ESRD S/P failed renal tx - secondary to missed immunosuppression doses/rejection- Continue dialysis on TTS schedule;  3. Hypertension/volume - off all BP meds; re-eval edw; 4. Anemia - Hgb >11.5, no ESA; on 100 Venofer q 2 weeks; hold for now 5. Metabolic bone disease - zemplar not needed iPTH 63; Ca borderline elevated, will use a 2 Ca bath here 6. Nutrition she was discharged on a regular diet last  admission with caution to avoid ^ K and ^ P foods; wouldn't eat hospital diet; give renal diet this admission. 7. CM - longstanding hypertension; 2 D Echo shows significantly low EF and hypokinesis (global)    Zetta Bills, MD 02/22/2012, 8:53 AM

## 2012-02-22 NOTE — Progress Notes (Signed)
ANTICOAGULATION CONSULT NOTE - Follow Up Consult  Pharmacy Consult for heparin Indication: pulmonary embolus  Labs:  Basename 02/22/12 0514 02/21/12 1801 02/21/12 0655 02/20/12 2320  HGB 11.8* -- 12.0 --  HCT 37.2 -- 38.5 41.9  PLT 218 -- 241 217  APTT -- -- 198* 36  LABPROT -- 18.4* 19.7* 19.0*  INR -- 1.50* 1.64* 1.56*  HEPARINUNFRC 0.13* 0.94* 0.60 --  CREATININE -- -- -- 6.80*  CKTOTAL -- -- -- --  CKMB -- -- -- --  TROPONINI -- -- -- --    Assessment: 20yo female now subtherapeutic on heparin after rate decrease for high level.  Goal of Therapy:  Heparin level 0.3-0.7 units/ml   Plan:  Was high at 1100 units/hr and low at 900 units/hr so will change heparin gtt to 1000 units/hr and check level in 8hr.  Colleen Can PharmD BCPS 02/22/2012,6:41 AM

## 2012-02-22 NOTE — Procedures (Signed)
I have personally attended this patient's dialysis session.  She is currently completing her treatment, 3200 ml off, BP 93 systolic.  Permcath ran reversed as arterial side did not pull well.  Ran at 300 BFR.  [name] Small B

## 2012-02-22 NOTE — Progress Notes (Addendum)
ANTICOAGULATION CONSULT NOTE - Follow Up Consult  Pharmacy Consult for Heparin & Coumadin Indication: pulmonary embolus- possible extension  Allergies  Allergen Reactions  . Morphine And Related Itching    Patient Measurements: Height: 5\' 3"  (160 cm) Weight: 151 lb 10.8 oz (68.8 kg) IBW/kg (Calculated) : 52.4   Vital Signs: Temp: 97.1 F (36.2 C) (05/02 1245) Temp src: Oral (05/02 1245) BP: 93/51 mmHg (05/02 1709) Pulse Rate: 87  (05/02 1709)  Labs:  Basename 02/22/12 1740 02/22/12 0514 02/21/12 1801 02/21/12 0655 02/20/12 2320  HGB -- 11.8* -- 12.0 --  HCT -- 37.2 -- 38.5 41.9  PLT -- 218 -- 241 217  APTT -- -- -- 198* 36  LABPROT 16.5* -- 18.4* 19.7* --  INR 1.31 -- 1.50* 1.64* --  HEPARINUNFRC 0.93* 0.13* 0.94* -- --  CREATININE -- -- -- -- 6.80*  CKTOTAL -- -- -- -- --  CKMB -- -- -- -- --  TROPONINI -- -- -- -- --   Estimated Creatinine Clearance: 12.3 ml/min (by C-G formula based on Cr of 6.8).   Medications:  Scheduled:     . acetaminophen  500 mg Oral TID  . calcium acetate  667 mg Oral TID WC  . multivitamin  1 tablet Oral QHS  . sodium chloride  3 mL Intravenous Q12H  . Warfarin - Pharmacist Dosing Inpatient   Does not apply q1800    Assessment: 20yo female on Heparin & Coumadin for PE.  Heparin level reported as 0.93 on 1000 units/hr.  Heparin level fluctuating either above goal or below goal with only minor changes in Heparin rate.  Concerning for accuracy of level. Or was previous low Heparin level of 0.13 accurate?  Drawn in hemodialysis tonight.  INR 1.31 No bleeding reported per RN assessment.   Goal of Therapy:  INR 2-3 & heparin level 0.3-0.7   Plan:  Decrease IV heparin drip rate to 900 units/hr  Check 6hr Heparin level Give Coumadin 7.5 mg po tonight INR, CBC qAM  Noah Delaine, RPh Clinical Pharmacist 02/22/2012 19:07

## 2012-02-22 NOTE — Progress Notes (Signed)
ANTICOAGULATION CONSULT NOTE - Follow Up Consult  Pharmacy Consult for Heparin & Coumadin Indication: pulmonary embolus- possible extension  Allergies  Allergen Reactions  . Morphine And Related Itching    Patient Measurements: Height: 5\' 3"  (160 cm) Weight: 157 lb 10.1 oz (71.5 kg) IBW/kg (Calculated) : 52.4   Vital Signs: Temp: 98.7 F (37.1 C) (05/02 0655) Temp src: Oral (05/02 0655) BP: 105/64 mmHg (05/02 0655) Pulse Rate: 84  (05/02 0655)  Labs:  Basename 02/22/12 0514 02/21/12 1801 02/21/12 0655 02/20/12 2320  HGB 11.8* -- 12.0 --  HCT 37.2 -- 38.5 41.9  PLT 218 -- 241 217  APTT -- -- 198* 36  LABPROT -- 18.4* 19.7* 19.0*  INR -- 1.50* 1.64* 1.56*  HEPARINUNFRC 0.13* 0.94* 0.60 --  CREATININE -- -- -- 6.80*  CKTOTAL -- -- -- --  CKMB -- -- -- --  TROPONINI -- -- -- --   Estimated Creatinine Clearance: 12.5 ml/min (by C-G formula based on Cr of 6.8).   Medications:  Scheduled:    . acetaminophen  500 mg Oral TID  . calcium acetate  667 mg Oral TID WC  . multivitamin  1 tablet Oral QHS  . sodium chloride  3 mL Intravenous Q12H  . warfarin  7.5 mg Oral ONCE-1800  . Warfarin - Pharmacist Dosing Inpatient   Does not apply q1800    Assessment: 21yo female on Heparin & Coumadin for PE.  Heparin was adjusted this AM with f/u HL this afternoon.  INR was not drawn.  No issues noted.  Goal of Therapy:  INR 2-3 & heparin level 0.3-0.7   Plan:  1.  F/U heparin level 2.  Add INR to afternoon HL, and daily  Marisue Humble, PharmD Clinical Pharmacist [facility]

## 2012-02-22 NOTE — Progress Notes (Signed)
[name] Small was in the hemodialysis unit when a code blue was called for another patient.  [name] Small appeared frightened by all the commotion so I provided emotional and social support.  During this time we engaged in spiritual conversation concerning her condition and her prognosis. She is struggling with questions of why she is suffering.  We explored this topic but she would benefit from further support.  I explained to her she could ask for a chaplain anytime.  I will refer her to the unit chaplain, please page a chaplain if further assistance is needed.  [name] Small  [phone] personal pager   [phone]  On-call pager  02/22/12 1546  Clinical Encounter Type  Visited With Patient  Visit Type Social support;Psychological support  Spiritual Encounters  Spiritual Needs Emotional  Stress Factors  Patient Stress Factors Loss of control;Lack of knowledge;Health changes  Family Stress Factors Not reviewed

## 2012-02-22 NOTE — Progress Notes (Signed)
Subjective: C/o 10/10 chest pain  Deep , anterior, retrosternal,   Not much improved  Asking for iv dilaudid and will not take anything else    Physical Exam: Blood pressure 114/53, pulse 86, temperature 97.1 F (36.2 C), temperature source Oral, resp. rate 16, height 5\' 3"  (1.6 m), weight 71.5 kg (157 lb 10.1 oz), last menstrual period 01/17/2012, SpO2 100.00%. Patient Vitals for the past 24 hrs:  BP Temp Temp src Pulse Resp SpO2  02/22/12 1502 114/53 mmHg - - 86  16  -  02/22/12 1430 114/53 mmHg - - 81  14  -  02/22/12 1400 121/77 mmHg - - 79  12  -  02/22/12 1330 118/74 mmHg - - 78  18  -  02/22/12 1302 111/69 mmHg - - 75  23  -  02/22/12 1245 136/82 mmHg 97.1 F (36.2 C) Oral 72  17  -  02/22/12 0655 105/64 mmHg 98.7 F (37.1 C) Oral 84  19  100 %  02/21/12 2315 120/70 mmHg - - 78  - -  02/21/12 2303 111/79 mmHg - - - - -  02/21/12 2100 116/74 mmHg 97.8 F (36.6 C) Oral 84  18  100 %  02/21/12 1830 126/84 mmHg 97.7 F (36.5 C) Oral 90  18  98 %  02/21/12 1709 116/81 mmHg - - - - -   axox3 CVS: RRR, S3 gallop, fixed S2Spilt  RS: CTAB Abdomen : soft, NT   Investigations:  No results found for this or any previous visit (from the past 240 hour(s)).   Basic Metabolic Panel:  Basename 02/20/12 2320  NA 139  K 4.3  CL 98  CO2 24  GLUCOSE 85  BUN 20  CREATININE 6.80*  CALCIUM 10.5  MG 2.0  PHOS 3.9   Liver Function Tests:  Basename 02/20/12 2320  AST 35  ALT 17  ALKPHOS 71  BILITOT 0.4  PROT 8.7*  ALBUMIN 3.9     CBC:  Basename 02/22/12 0514 02/21/12 0655 02/20/12 2320  WBC 12.2* 7.7 --  NEUTROABS -- -- 4.0  HGB 11.8* 12.0 --  HCT 37.2 38.5 --  MCV 86.1 89.5 --  PLT 218 241 --    Dg Chest Port 1 View  02/20/2012  *RADIOLOGY REPORT*  Clinical Data: Chest pain  PORTABLE CHEST - 1 VIEW  Comparison: 02/07/2012 CT  Findings: Cardiomegaly.  Mild lingular opacity.  The cavitary lesion in the right lower lobe is poorly visualized by radiograph. No  pneumothorax.  No pleural effusion.  Large bore right IJ catheter tip projects over the proximal SVC.  No acute osseous abnormality.  IMPRESSION: Cardiomegaly.  Mild lingular opacity.  Original Report Authenticated By: Waneta Martins, M.D.    Results for ALAYJAH, BOEHRINGER (MRN [id number]) as of 02/22/2012 08:01  Ref. Range 02/20/2012 23:20 02/21/2012 06:55 02/21/2012 18:01  Prothrombin Time Latest Range: 11.6-15.2 seconds 19.0 (H) 19.7 (H) 18.4 (H)  INR Latest Range: 0.00-1.49  1.56 (H) 1.64 (H) 1.50 (H)    Medications:  Scheduled:    . acetaminophen  500 mg Oral TID  . calcium acetate  667 mg Oral TID WC  . multivitamin  1 tablet Oral QHS  . sodium chloride  3 mL Intravenous Q12H  . warfarin  7.5 mg Oral ONCE-1800  . Warfarin - Pharmacist Dosing Inpatient   Does not apply q1800   Continuous:    . heparin 1,000 Units/hr (02/22/12 1500)   WUJ:WJXBJYNWGNFAO, acetaminophen, albuterol, calcium carbonate (dosed in mg elemental  calcium), camphor-menthol, docusate sodium, feeding supplement (NEPRO CARB STEADY), heparin, HYDROmorphone, hydrOXYzine, ipratropium, ondansetron (ZOFRAN) IV, ondansetron, sorbitol, zolpidem, DISCONTD: HYDROmorphone  Impression:  Principal Problem:  *Pleuritic chest pain Active Problems:  ESRD (end stage renal disease)  PE (pulmonary embolism)  Pulmonary infarct  Hypotension  Systolic CHF, chronic     Plan: Ashby Dawes of the chest pain is unclear -  Heparin and Coumadin until INR is therapeutic  She did not follow up with PCP for INR checks post DC Cannot tolerate BP meds anymore due to low EF She needs to f/u with cardiology at [facility]         LOS: 2 days   Jushua Waltman, MD Pager: [phone] 02/22/2012, 4:57 PM   pager [phone]

## 2012-02-23 DIAGNOSIS — I959 Hypotension, unspecified: Secondary | ICD-10-CM

## 2012-02-23 DIAGNOSIS — I10 Essential (primary) hypertension: Secondary | ICD-10-CM

## 2012-02-23 DIAGNOSIS — N186 End stage renal disease: Secondary | ICD-10-CM

## 2012-02-23 DIAGNOSIS — I509 Heart failure, unspecified: Secondary | ICD-10-CM

## 2012-02-23 DIAGNOSIS — D696 Thrombocytopenia, unspecified: Secondary | ICD-10-CM | POA: Diagnosis not present

## 2012-02-23 DIAGNOSIS — I2699 Other pulmonary embolism without acute cor pulmonale: Principal | ICD-10-CM

## 2012-02-23 DIAGNOSIS — Q613 Polycystic kidney, unspecified: Secondary | ICD-10-CM

## 2012-02-23 DIAGNOSIS — I5022 Chronic systolic (congestive) heart failure: Secondary | ICD-10-CM

## 2012-02-23 LAB — GLUCOSE, CAPILLARY

## 2012-02-23 LAB — CBC
HCT: 40.7 % (ref 36.0–46.0)
Hemoglobin: 13 g/dL (ref 12.0–15.0)
MCV: 90.8 fL (ref 78.0–100.0)
RBC: 4.48 MIL/uL (ref 3.87–5.11)
WBC: 7.5 10*3/uL (ref 4.0–10.5)

## 2012-02-23 LAB — HEPARIN LEVEL (UNFRACTIONATED)
Heparin Unfractionated: 0.84 IU/mL — ABNORMAL HIGH (ref 0.30–0.70)
Heparin Unfractionated: 0.47 IU/mL (ref 0.30–0.70)

## 2012-02-23 LAB — PROTIME-INR
INR: 1.19 (ref 0.00–1.49)
Prothrombin Time: 15.4 seconds — ABNORMAL HIGH (ref 11.6–15.2)

## 2012-02-23 MED ORDER — WARFARIN SODIUM 10 MG PO TABS
10.0000 mg | ORAL_TABLET | Freq: Once | ORAL | Status: AC
  Administered 2012-02-23: 10 mg via ORAL
  Filled 2012-02-23: qty 1

## 2012-02-23 NOTE — Consult Note (Addendum)
Advanced Heart Failure Team Consult Note  Referring Physician:  Primary Physician: [facility].  Primary Cardiologist:   Nephrologist: Dr. 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   Underwent transplant nephrectomy 08/2011, now with ESRD on HD (TTS).  Has missed HD intermittently. Had fistula placed but clotted now dialyzes through RIJ perm cath. Was previously on 4 anti-HTN meds but these had to be stopped due to low BP and now often has to come off HD early.     She had a recent admission (02/14/12) for bilateral PE with pulmonary infarct and questionable cavitary lesion due to necrosis in 01/2012, on coumadin.  CT angio 4/13 showed moderate clot burden.  Echo at the time showed EF 15-20%, severe HK, mod MR (directed posteriorly).  Dr. Sharyn Lull was consulted and titrated HF meds at that time.    She was discharged on 4/24 and presented back to [facility] with continued complaints of chest pain and dyspnea.  She was transferred to [facility] for further work up.  INR 1.5 on admit she has been placed on heparin gtt and continued coumadin.  Echo 5/1:Mildly dilated LV with global hypokinesis, EF 20%. Mildly dilated RV with mildly decreased systolic function.  Elevated PA diastolic pressure.  HD usually takes care of her fluid.  Between therapies she does say she has to take breaks if she goes to the store.    Currently lives with boyfriend.  Parents live in [place]. She says her parents don't "deal well" with her medical conditions.  She has O+ blood.   Review of Systems: [y] = yes, [ ]  = no   General: Weight gain [ ] ; Weight loss [ ] ; Anorexia [ ] ; Fatigue [ ] ; Fever [ ] ; Chills [ ] ; Weakness [ ]   Cardiac: Chest  pain/pressure [ ] ; Resting SOB [ ] ; Exertional SOB [ ] ; Orthopnea [ ] ; Pedal Edema [ ] ; Palpitations [ ] ; Syncope [ ] ; Presyncope [ ] ; Paroxysmal nocturnal dyspnea[ ]   Pulmonary: Cough [ ] ; Wheezing[ ] ; Hemoptysis[ ] ; Sputum [ ] ; Snoring [ ]   GI: Vomiting[ ] ; Dysphagia[ ] ; Melena[ ] ; Hematochezia [ ] ; Heartburn[ ] ; Abdominal pain [ ] ; Constipation [ ] ; Diarrhea [ ] ; BRBPR [ ]   GU: Hematuria[ ] ; Dysuria [ ] ; Nocturia[ ]   Vascular: Pain in legs with walking [ ] ; Pain in feet with lying flat [ ] ; Non-healing sores [ ] ; Stroke [ ] ; TIA [ ] ; Slurred speech [ ] ;  Neuro: Headaches[ ] ; Vertigo[ ] ; Seizures[ ] ; Paresthesias[ ] ;Blurred vision [ ] ; Diplopia [ ] ; Vision changes [ ]   Ortho/Skin: Arthritis [ ] ; Joint pain [ ] ; Muscle pain [ ] ; Joint swelling [ ] ; Back Pain [ ] ; Rash [ ]   Psych: Depression[ ] ; Anxiety[ ]   Heme: Bleeding problems [ ] ; Clotting disorders [ ] ; Anemia [ ]   Endocrine: Diabetes [ ] ; Thyroid dysfunction[ ]   Home Medications Prior to Admission medications   Medication Sig Start Date End Date Taking? Authorizing Provider  calcium acetate (PHOSLO) 667 MG capsule Take 667 mg by mouth 3 (three) times daily with meals.     Yes Historical Provider, MD  carvedilol (COREG) 6.25 MG tablet Take 1 tablet (6.25 mg total) by mouth 2 (two) times daily with a meal. 02/14/12  02/13/13 Yes Laveda Norman, MD  enalapril (VASOTEC) 10 MG tablet Take 10 mg by mouth daily.   Yes Historical Provider, MD  hydrALAZINE (APRESOLINE) 25 MG tablet Take 25 mg by mouth 2 (two) times daily with a meal.   Yes Historical Provider, MD  isosorbide dinitrate (ISORDIL) 20 MG tablet Take 20 mg by mouth 3 (three) times daily.   Yes Historical Provider, MD  multivitamin (RENA-VIT) TABS tablet Take 1 tablet by mouth at bedtime. 02/14/12  Yes Laveda Norman, MD  oxyCODONE-acetaminophen (PERCOCET) 5-325 MG per tablet Take 1-2 tablets by mouth every 4 (four) hours as needed. For pain    Yes Historical Provider, MD  promethazine  (PHENERGAN) 25 MG tablet Take 25 mg by mouth every 6 (six) hours as needed. For nausea and vomiting    Yes Historical Provider, MD  warfarin (COUMADIN) 5 MG tablet Take 1 tablet (5 mg total) by mouth daily. 02/14/12 02/13/13 Yes Laveda Norman, MD    Past Medical History: Past Medical History  Diagnosis Date  . 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 Hemodialysis patient   . Hypertension   . Polycystic kidney disease   . Pulmonary emboli 01/2012    Bilateral, moderate clot burden, areas of pulmonary infarction and central necrosis  . CHF (congestive heart failure)   . Cardiomyopathy     Past Surgical History: Past Surgical History  Procedure Date  . Nephrectomy   . Av fistula placement   . Kidney transplant 2008    failed    Family History: Family History  Problem Relation Age of Onset  . Polycystic kidney disease Father     Social History: History   Social History  . Marital Status: Single    Spouse Name: N/A    Number of Children: N/A  . Years of Education: N/A   Social History Main Topics  . Smoking status: Former Smoker -- 0.2 packs/day    Types: Cigarettes  . Smokeless tobacco: None  . Alcohol Use: No  . Drug Use: No  . Sexually Active: None   Other Topics Concern  . None   Social History Narrative   Was smoking 3 cigs per day. Lives at home with roommates, has some family locally.     Allergies:  Allergies  Allergen Reactions  . Morphine And Related Itching    Objective:    Vital Signs:   Temp:  [97.8 F (36.6 C)-99 F (37.2 C)] 99 F (37.2 C) (05/03 1406) Pulse Rate:  [82-94] 94  (05/03 1406) Resp:  [17-20] 18  (05/03 1406) BP: (90-109)/(56-70) 98/62 mmHg (05/03 1520) SpO2:  [96 %-99 %] 96 % (05/03 1406) Last BM Date: 02/22/12  Weight change: Filed Weights   02/20/12 2131 02/22/12 1709  Weight: 71.5 kg (157 lb 10.1 oz) 68.8 kg (151 lb 10.8  oz)    Intake/Output:   Intake/Output Summary (Last 24 hours) at 02/23/12 1832 Last data filed at 02/23/12 0928  Gross per 24 hour  Intake      3 ml  Output      0 ml  Net      3 ml     Physical Exam: General:  Well appearing. No resp difficulty HEENT: normal Neck: supple. JVP . Carotids 2+ bilat; no bruits. No lymphadenopathy or thryomegaly appreciated. Cor: PMI nondisplaced. Regular rate & rhythm. No rubs, gallops or murmurs. Lungs: clear Abdomen:  soft, nontender, nondistended. No hepatosplenomegaly. No bruits or masses. Good bowel sounds. Extremities: no cyanosis, clubbing, rash, edema Neuro: alert & orientedx3, cranial nerves grossly intact. moves all 4 extremities w/o difficulty. Affect pleasant    Labs: Basic Metabolic Panel:  Lab 02/20/12 1610  NA 139  K 4.3  CL 98  CO2 24  GLUCOSE 85  BUN 20  CREATININE 6.80*  CALCIUM 10.5  MG 2.0  PHOS 3.9    Liver Function Tests:  Lab 02/20/12 2320  AST 35  ALT 17  ALKPHOS 71  BILITOT 0.4  PROT 8.7*  ALBUMIN 3.9   No results found for this basename: LIPASE:5,AMYLASE:5 in the last 168 hours No results found for this basename: AMMONIA:3 in the last 168 hours  CBC:  Lab 02/23/12 0325 02/29/2012 0514 02/21/12 0655 02/20/12 2320  WBC 7.5 12.2* 7.7 8.1  NEUTROABS -- -- -- 4.0  HGB 13.0 11.8* 12.0 13.3  HCT 40.7 37.2 38.5 41.9  MCV 90.8 86.1 89.5 90.3  PLT 111* 218 241 217    Cardiac Enzymes: No results found for this basename: CKTOTAL:5,CKMB:5,CKMBINDEX:5,TROPONINI:5 in the last 168 hours  BNP: BNP (last 3 results)  Basename 02/20/12 2326  PROBNP 20591.0*    CBG:  Lab 02/23/12 0736 02/29/2012 0809  GLUCAP 89 84    Coagulation Studies:  Basename 02/23/12 0225 02/29/12 1740 02/21/12 1801 02/21/12 0655 02/20/12 2320  LABPROT 15.4* 16.5* 18.4* 19.7* 19.0*  INR 1.19 1.31 1.50* 1.64* 1.56*    Other results: EKG: NSR RA enlargement/ LVH with strain Imaging:  No results found.   Medications:      Current Medications:    . acetaminophen  500 mg Oral TID  . calcium acetate  667 mg Oral TID WC  . multivitamin  1 tablet Oral QHS  . sodium chloride  3 mL Intravenous Q12H  . warfarin  10 mg Oral ONCE-1800  . warfarin  7.5 mg Oral NOW  . Warfarin - Pharmacist Dosing Inpatient   Does not apply q1800     Infusions:    . heparin 750 Units/hr (02/23/12 0524)      Assessment:   1. Acute PE/pulmonry infarct 2. Recurrent CP 3. Systolic HF likely due to NICM EF 20% 4. ESRD due to PCKD      --s/p failed transplant due to noncompliance 5. Medical noncompliance           Plan/Discussion:    Attending: Extremely difficult situation. She has severe LV dysfunction likely due to NICM in the setting of ESRD with failed transplant due to noncompliance. Recently admitted with acute PE. Currently NYHA II-III. Unfortunately medical therapy limited due to hypotension.   Her only real durable option is heart-kidney transplant but she is absolutely not a candidate for this at this time given her social situation and extreme noncompliance. So unfortunately we have very little to offer her at this point. I explained this to her and it did not seem to make a big impact. Would try add low dose Toprol 25 qhs on non-dialysis days as tolerated.  With regard to her PE, I suspect she will be also noncompliant with her coumadin therapy and may benefit from an IVC filter with interventional radiology. She is willing to consider this. Would particularly favor this if she had significant residual lower extremity clot. We will order LE u/s.  I will d/w her family but unfortunately I think there is very little we can offer her.   I would strongly consider a psych  consult to better understand what is driving her noncompliant behavior and difficult relationship with her family. (she said her family was not going to come visit her but when we were in the room her mother called and said she was on the  way).   Truman Hayward 7:33 PM  Length of Stay: 3

## 2012-02-23 NOTE — Progress Notes (Addendum)
Subjective: Same chest pain    Physical Exam: Blood pressure 90/56, pulse 82, temperature 98.4 F (36.9 C), temperature source Oral, resp. rate 18, height 5\' 3"  (1.6 m), weight 68.8 kg (151 lb 10.8 oz), last menstrual period 01/17/2012, SpO2 99.00%. Patient Vitals for the past 24 hrs:  BP Temp Temp src Pulse Resp SpO2 Weight  02/23/12 0656 90/56 mmHg - - 82  18  - -  02/23/12 0613 100/62 mmHg - - - - - -  02/23/12 0500 - 98.4 F (36.9 C) - 90  17  99 % -  02/23/12 0423 109/63 mmHg - - - - - -  02/23/12 0325 98/66 mmHg - - - - - -  02/22/12 2315 106/66 mmHg - - - - - -  02/22/12 2100 101/70 mmHg 97.8 F (36.6 C) - 82  20  99 % -  02/22/12 1709 93/51 mmHg - - 87  18  - 68.8 kg (151 lb 10.8 oz)  02/22/12 1630 108/57 mmHg - - 92  18  - -  02/22/12 1600 117/53 mmHg - - 83  18  - -  02/22/12 1502 114/53 mmHg - - 86  16  - -  02/22/12 1430 114/53 mmHg - - 81  14  - -  02/22/12 1400 121/77 mmHg - - 79  12  - -  02/22/12 1330 118/74 mmHg - - 78  18  - -  02/22/12 1302 111/69 mmHg - - 75  23  - -  02/22/12 1245 136/82 mmHg 97.1 F (36.2 C) Oral 72  17  - -   axox3 CVS: RRR, S3 gallop, fixed S2Spilt  RS: CTAB Abdomen : soft, NT   Investigations:  No results found for this or any previous visit (from the past 240 hour(s)).   Basic Metabolic Panel:  Basename 02/20/12 2320  NA 139  K 4.3  CL 98  CO2 24  GLUCOSE 85  BUN 20  CREATININE 6.80*  CALCIUM 10.5  MG 2.0  PHOS 3.9   Liver Function Tests:  Basename 02/20/12 2320  AST 35  ALT 17  ALKPHOS 71  BILITOT 0.4  PROT 8.7*  ALBUMIN 3.9     CBC:  Basename 02/23/12 0325 02/22/12 0514 02/20/12 2320  WBC 7.5 12.2* --  NEUTROABS -- -- 4.0  HGB 13.0 11.8* --  HCT 40.7 37.2 --  MCV 90.8 86.1 --  PLT 111* 218 --    No results found.  Results for ALEISHA, PAONE (MRN [id number]) as of 02/23/2012 15:13  Ref. Range 02/21/2012 18:01 02/22/2012 05:14 02/22/2012 17:40 02/23/2012 02:25 02/23/2012 13:22  Heparin Unfractionated  Latest Range: 0.30-0.70 IU/mL 0.94 (H) 0.13 (L) 0.93 (H) 0.84 (H) 0.47  Prothrombin Time Latest Range: 11.6-15.2 seconds 18.4 (H)  16.5 (H) 15.4 (H)   INR Latest Range: 0.00-1.49  1.50 (H)  1.31 1.19   aPTT Latest Range: 24-37 seconds    133 (H)     Medications:  Scheduled:    . acetaminophen  500 mg Oral TID  . calcium acetate  667 mg Oral TID WC  . multivitamin  1 tablet Oral QHS  . sodium chloride  3 mL Intravenous Q12H  . warfarin  10 mg Oral ONCE-1800  . warfarin  7.5 mg Oral NOW  . Warfarin - Pharmacist Dosing Inpatient   Does not apply q1800   Continuous:    . heparin 750 Units/hr (02/23/12 0524)   WUJ:WJXBJYNWGNFAO, acetaminophen, albuterol, calcium carbonate (dosed in mg elemental calcium), camphor-menthol,  docusate sodium, feeding supplement (NEPRO CARB STEADY), heparin, HYDROmorphone, hydrOXYzine, ipratropium, ondansetron (ZOFRAN) IV, ondansetron, sorbitol, zolpidem  Impression:  Principal Problem:  *Pleuritic chest pain Active Problems:  ESRD (end stage renal disease)  PE (pulmonary embolism)  Pulmonary infarct  Hypotension  Systolic CHF, chronic  Thrombocytopenia     Plan: 1. Chest pain  Ashby Dawes of the chest pain is unclear - ? Due to recent PE but it is not pleuritic and not in the area of the lung infarct. She does not have severe pulm HTn per echo   2. Recent massive PE - patient was non compliant with coumadin  Heparin and Coumadin until INR is therapeutic  She did not follow up with PCP for INR checks post DC Now she developed low platelets - will check HIT panel and f/u closely  3. 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 Etiology  Cannot tolerate BP meds anymore due to low EF I have called the HF team     4. ESRD - failed transplant . HD per [facility]    LOS: 3 days   Trisha Ken, MD Pager: [phone] 02/23/2012, 9:14 AM   pager [phone]

## 2012-02-23 NOTE — Progress Notes (Signed)
Patient ID: [name] Small, female   DOB: 1991/09/22, 21 y.o.   MRN: [id number]   [facility] Progress Note    Subjective:   Reports ongoing CP-apparently unaffected by HD. Denies SOB. Yesterday's events noted.   Objective:   BP 90/56  Pulse 82  Temp(Src) 98.4 F (36.9 C) (Oral)  Resp 18  Ht 5\' 3"  (1.6 m)  Wt 68.8 kg (151 lb 10.8 oz)  BMI 26.87 kg/m2  SpO2 99%  LMP 01/17/2012  Physical Exam: JXB:JYNWGNFAOZH sleeping in bed. Meal tray empty. YQM:VHQIO RRR, Normal S1 with loud S2 Resp:CTA bilaterally, no rales/rhonchi NGE:XBMW, flat, non-tender, BS normal Ext:No LE edema.   Labs: BMET  Lab 02/20/12 2320  NA 139  K 4.3  CL 98  CO2 24  GLUCOSE 85  BUN 20  CREATININE 6.80*  ALB --  CALCIUM 10.5  PHOS 3.9   CBC  Lab 02/23/12 0325 02/22/12 0514 02/21/12 0655 02/20/12 2320  WBC 7.5 12.2* 7.7 8.1  NEUTROABS -- -- -- 4.0  HGB 13.0 11.8* 12.0 13.3  HCT 40.7 37.2 38.5 41.9  MCV 90.8 86.1 89.5 90.3  PLT 111* 218 241 217    @IMGRELPRIORS @ Medications:      . acetaminophen  500 mg Oral TID  . calcium acetate  667 mg Oral TID WC  . multivitamin  1 tablet Oral QHS  . sodium chloride  3 mL Intravenous Q12H  . warfarin  10 mg Oral ONCE-1800  . warfarin  7.5 mg Oral NOW  . Warfarin - Pharmacist Dosing Inpatient   Does not apply q1800     Assessment/ Plan:   1. CP/pleurtic with hx recent PE: Mainly pleuritic and associated with recent PE- ongoing anticoagulation (INR today 1.2) and pain control. A large anxiety component noted as well. 2. ESRD S/P failed renal tx - secondary to missed immunosuppression doses/rejection- Continue dialysis on TTS schedule; PLan for Hd tomorrow, no acute needs today. 3. Hypertension/volume - off all BP meds; re-eval edw; 4. Anemia - Hgb >11.5, no ESA; on 100 Venofer q 2 weeks; hold for now 5. Metabolic bone disease - zemplar not needed iPTH 63; Ca borderline elevated, will use a 2 Ca bath here 6. Nutrition she was  discharged on a regular diet last admission with caution to avoid ^ K and ^ P foods; wouldn't eat hospital diet; give renal diet this admission. 7. CM - longstanding hypertension; 2 D Echo shows significantly low EF and hypokinesis (global)    Zetta Bills, MD 02/23/2012, 8:40 AM

## 2012-02-23 NOTE — Progress Notes (Signed)
ANTICOAGULATION CONSULT NOTE - Follow Up Consult  Pharmacy Consult for Heparin & Coumadin Day#2 overlap Indication: pulmonary embolus- possible extension  Allergies  Allergen Reactions  . Morphine And Related Itching    Patient Measurements: Height: 5\' 3"  (160 cm) Weight: 151 lb 10.8 oz (68.8 kg) IBW/kg (Calculated) : 52.4   Vital Signs: Temp: 98.4 F (36.9 C) (05/03 0500) BP: 94/70 mmHg (05/03 1025) Pulse Rate: 82  (05/03 0656)  Labs:  Basename 02/23/12 1322 02/23/12 0325 02/23/12 0225 02/22/12 1740 02/22/12 0514 02/21/12 1801 02/21/12 0655 02/20/12 2320  HGB -- 13.0 -- -- 11.8* -- -- --  HCT -- 40.7 -- -- 37.2 -- 38.5 --  PLT -- 111* -- -- 218 -- 241 --  APTT -- -- 133* -- -- -- 198* 36  LABPROT -- -- 15.4* 16.5* -- 18.4* -- --  INR -- -- 1.19 1.31 -- 1.50* -- --  HEPARINUNFRC 0.47 -- 0.84* 0.93* -- -- -- --  CREATININE -- -- -- -- -- -- -- 6.80*  CKTOTAL -- -- -- -- -- -- -- --  CKMB -- -- -- -- -- -- -- --  TROPONINI -- -- -- -- -- -- -- --   Estimated Creatinine Clearance: 12.3 ml/min (by C-G formula based on Cr of 6.8).  Assessment: 21 yo female with ESRD, HF EF 20% on Heparin & Coumadin for PE. Heparin level (0.47) is at goa l on 750 units/hr. No bleeding noted. INR (1.19) is below-goal, and continues to trend downward. Noted that platelets of 111 for today (218 yesterday). During previous admission in April 2013, platelets with similar drop and HIT-panel was negative (02/05/12).  Recheck HIT panel is pending.    Goal of Therapy:  INR 2-3 & heparin level 0.3-0.7   Plan:  1. Continue IV heparin to 750 units/hr.  2. Coumadin 10 mg po today.  3.daily HL, cbc, INR.   Leota Sauers Pharm.D. CPP, BCPS Clinical Pharmacist [phone] 02/23/2012 2:06 PM

## 2012-02-23 NOTE — Progress Notes (Signed)
Spoke with pt, she stated that it was ok to share information with her mother Junious Dresser) about her care

## 2012-02-23 NOTE — Progress Notes (Signed)
ANTICOAGULATION CONSULT NOTE - Follow Up Consult  Pharmacy Consult for Heparin & Coumadin Indication: pulmonary embolus- possible extension  Allergies  Allergen Reactions  . Morphine And Related Itching    Patient Measurements: Height: 5\' 3"  (160 cm) Weight: 151 lb 10.8 oz (68.8 kg) IBW/kg (Calculated) : 52.4   Vital Signs: Temp: 97.8 F (36.6 C) (05/02 2100) BP: 109/63 mmHg (05/03 0423) Pulse Rate: 82  (05/02 2100)  Labs:  Alvira Philips 02/23/12 0325 02/23/12 0225 02/22/12 1740 02/22/12 0514 02/21/12 1801 02/21/12 0655 02/20/12 2320  HGB 13.0 -- -- 11.8* -- -- --  HCT 40.7 -- -- 37.2 -- 38.5 --  PLT 111* -- -- 218 -- 241 --  APTT -- -- -- -- -- 198* 36  LABPROT -- 15.4* 16.5* -- 18.4* -- --  INR -- 1.19 1.31 -- 1.50* -- --  HEPARINUNFRC -- 0.84* 0.93* 0.13* -- -- --  CREATININE -- -- -- -- -- -- 6.80*  CKTOTAL -- -- -- -- -- -- --  CKMB -- -- -- -- -- -- --  TROPONINI -- -- -- -- -- -- --   Estimated Creatinine Clearance: 12.3 ml/min (by C-G formula based on Cr of 6.8).   Medications:  Scheduled:     . acetaminophen  500 mg Oral TID  . calcium acetate  667 mg Oral TID WC  . multivitamin  1 tablet Oral QHS  . sodium chloride  3 mL Intravenous Q12H  . warfarin  7.5 mg Oral NOW  . Warfarin - Pharmacist Dosing Inpatient   Does not apply q1800    Assessment: 21 yo female on Heparin & Coumadin for PE. Heparin level (0.84) is above-goal on 900 units/hr. No bleeding reported per RN. INR (1.19) is below-goal, and continues to trend downward. Noted that platelets of 111 for today (218 yesterday). During previous admission in April 2013, platelets with similar drop and HIT-panel was negative (02/05/12).   Goal of Therapy:  INR 2-3 & heparin level 0.3-0.7   Plan:  1. Decrease IV heparin to 750 units/hr.  2. Heparin level in 8 hours.  3. Coumadin 10 mg po today.  4. Follow-up with rounding hospitalist regarding platelets and anticoagulation plan.   Lorre Munroe,  PharmD 02/23/2012, [phone]

## 2012-02-23 NOTE — Progress Notes (Signed)
pts c/o 9/10 CP; manual BP 94/70; paged Dr.Laza and made aware, said ok to give 0.25mg  of dilaudid

## 2012-02-24 ENCOUNTER — Inpatient Hospital Stay (HOSPITAL_COMMUNITY): Payer: Managed Care, Other (non HMO)

## 2012-02-24 DIAGNOSIS — Q613 Polycystic kidney, unspecified: Secondary | ICD-10-CM

## 2012-02-24 DIAGNOSIS — I959 Hypotension, unspecified: Secondary | ICD-10-CM

## 2012-02-24 DIAGNOSIS — N186 End stage renal disease: Secondary | ICD-10-CM

## 2012-02-24 DIAGNOSIS — I82409 Acute embolism and thrombosis of unspecified deep veins of unspecified lower extremity: Secondary | ICD-10-CM

## 2012-02-24 DIAGNOSIS — I10 Essential (primary) hypertension: Secondary | ICD-10-CM

## 2012-02-24 LAB — CBC
MCH: 28.6 pg (ref 26.0–34.0)
MCHC: 31.8 g/dL (ref 30.0–36.0)
MCV: 89.8 fL (ref 78.0–100.0)
Platelets: 106 10*3/uL — ABNORMAL LOW (ref 150–400)
RDW: 20.2 % — ABNORMAL HIGH (ref 11.5–15.5)

## 2012-02-24 LAB — RENAL FUNCTION PANEL
Albumin: 3.4 g/dL — ABNORMAL LOW (ref 3.5–5.2)
BUN: 32 mg/dL — ABNORMAL HIGH (ref 6–23)
CO2: 23 mEq/L (ref 19–32)
Chloride: 100 mEq/L (ref 96–112)
Creatinine, Ser: 8.79 mg/dL — ABNORMAL HIGH (ref 0.50–1.10)
Glucose, Bld: 83 mg/dL (ref 70–99)
Potassium: 4.2 mEq/L (ref 3.5–5.1)

## 2012-02-24 LAB — PROTIME-INR: Prothrombin Time: 16 seconds — ABNORMAL HIGH (ref 11.6–15.2)

## 2012-02-24 MED ORDER — WARFARIN SODIUM 10 MG PO TABS
10.0000 mg | ORAL_TABLET | Freq: Once | ORAL | Status: AC
  Administered 2012-02-24: 10 mg via ORAL
  Filled 2012-02-24: qty 1

## 2012-02-24 MED ORDER — HYDROMORPHONE HCL PF 1 MG/ML IJ SOLN
INTRAMUSCULAR | Status: AC
  Administered 2012-02-24: 0.25 mg via INTRAVENOUS
  Filled 2012-02-24: qty 1

## 2012-02-24 NOTE — Progress Notes (Signed)
ANTICOAGULATION CONSULT NOTE - Follow Up Consult  Pharmacy Consult for Heparin & Coumadin Day#3/5 overlap Indication: pulmonary embolus- possible extension  Allergies  Allergen Reactions  . Morphine And Related Itching    Patient Measurements: Height: 5\' 3"  (160 cm) Weight: 151 lb 10.8 oz (68.8 kg) IBW/kg (Calculated) : 52.4   Vital Signs: Temp: 98 F (36.7 C) (05/04 0600) BP: 140/77 mmHg (05/04 0600) Pulse Rate: 64  (05/04 0600)  Labs:  Basename 02/24/12 0530 02/23/12 1322 02/23/12 0325 02/23/12 0225 02/22/12 1740 02/22/12 0514  HGB 12.0 -- 13.0 -- -- --  HCT 37.7 -- 40.7 -- -- 37.2  PLT 106* -- 111* -- -- 218  APTT -- -- -- 133* -- --  LABPROT 16.0* -- -- 15.4* 16.5* --  INR 1.25 -- -- 1.19 1.31 --  HEPARINUNFRC 0.32 0.47 -- 0.84* -- --  CREATININE -- -- -- -- -- --  CKTOTAL -- -- -- -- -- --  CKMB -- -- -- -- -- --  TROPONINI -- -- -- -- -- --   Estimated Creatinine Clearance: 12.3 ml/min (by C-G formula based on Cr of 6.8).  Assessment: 21 yo female with ESRD, HF EF 20% on Heparin & Coumadin for PE. Heparin level (0.32) is at goa l on 750 units/hr but level dropping will give a little boost. No bleeding noted, h/h stable. INR (1.25) is below-goal, but starting to increase  Noted that platelets of 106 for today (241 admit ). During previous admission in April 2013, platelets with similar drop and HIT-panel was negative (02/05/12).  Recheck HIT panel is pending.   VS fluctuate 90/50 140/77 HR 64 to 80's - not on any BP meds at this time  Goal of Therapy:  INR 2-3 & heparin level 0.3-0.7   Plan:  1. Increase IV heparin to 850 units/hr.  2. Coumadin 10 mg po again today.  3.daily HL, cbc, INR.   Leota Sauers Pharm.D. CPP, BCPS Clinical Pharmacist [phone] 02/24/2012 8:15 AM

## 2012-02-24 NOTE — Progress Notes (Signed)
VASCULAR LAB PRELIMINARY  PRELIMINARY  PRELIMINARY  PRELIMINARY  Bilateral lower extremity venous Dopplers completed.    Preliminary report:  There is no obvious evidence of DVT or SVT noted in the bilateral lower extremities.  There is some debris noted in the left common femoral and femoral veins, that could suggest the possibility of recent clot.  Sherren Kerns [place], 02/24/2012, 4:05 PM

## 2012-02-24 NOTE — Progress Notes (Signed)
Called to get pt for dialysis. Pt refusing Tx at this time. Vilinda Blanks , PA aware. Nurse informed pt needs STAT renal panel and may need to get kayexelate if K is high. Last k done on 4/30.

## 2012-02-24 NOTE — Progress Notes (Signed)
Patient ID: [name] Small, female   DOB: June 11, 1991, 21 y.o.   MRN: [id number]   [facility] Progress Note    Subjective:   Reports ongoing Chest pain- not exacerbated by activity but apparently now affected by changes in body position   Objective:   BP 140/77  Pulse 64  Temp(Src) 98 F (36.7 C) (Oral)  Resp 14  Ht 5\' 3"  (1.6 m)  Wt 68.8 kg (151 lb 10.8 oz)  BMI 26.87 kg/m2  SpO2 99%  LMP 01/17/2012  Physical Exam: WUJ:WJXBJYNWGNF sleeping in bed. Meal tray untouched. AOZ:HYQMV RRR, Normal S1 with loud S2 Resp:CTA bilaterally, no rales/rhonchi HQI:ONGE, flat, non-tender, BS normal Ext: 1+ LE edema.   Labs: BMET  Lab 02/20/12 2320  NA 139  K 4.3  CL 98  CO2 24  GLUCOSE 85  BUN 20  CREATININE 6.80*  ALB --  CALCIUM 10.5  PHOS 3.9   CBC  Lab 02/24/12 0530 02/23/12 0325 02/22/12 0514 02/21/12 0655 02/20/12 2320  WBC 7.0 7.5 12.2* 7.7 --  NEUTROABS -- -- -- -- 4.0  HGB 12.0 13.0 11.8* 12.0 --  HCT 37.7 40.7 37.2 38.5 --  MCV 89.8 90.8 86.1 89.5 --  PLT 106* 111* 218 241 --    @IMGRELPRIORS @ Medications:      . acetaminophen  500 mg Oral TID  . calcium acetate  667 mg Oral TID WC  . multivitamin  1 tablet Oral QHS  . sodium chloride  3 mL Intravenous Q12H  . warfarin  10 mg Oral ONCE-1800  . Warfarin - Pharmacist Dosing Inpatient   Does not apply q1800     Assessment/ Plan:    1. CP/pleurtic with hx recent PE: Mainly pleuritic and associated with recent PE- ongoing anticoagulation (INR today 1.25) and pain control. A large anxiety component noted as well. 2. ESRD S/P failed renal tx - secondary to missed immunosuppression doses/rejection- Continue dialysis on TTS schedule; Planned for HD today. Would not refer/plan for KTx at this juncture. 3. Hypertension/volume - off all BP meds; re-eval EDW- beta blocker therapy limited by hypotension 4. Anemia - Hgb >11.5, no ESA; on 100 Venofer q 2 weeks; hold for now 5. Metabolic bone disease -  zemplar not needed iPTH 63; Ca borderline elevated, will use a 2 Ca bath here 6. Nutrition: On renal diet- monitor albumin and consider ONS if <3.8 7. CM - longstanding hypertension; 2 D Echo shows significantly low EF and hypokinesis (global)- Agree with Dr.Bensimhon's assessment that OHTx would be a futile if not risky undertaking with her poor compliance.   Zetta Bills, MD 02/24/2012, 8:16 AM

## 2012-02-24 NOTE — Progress Notes (Signed)
Subjective: -Patient continue to complains of chest discomfort. -otherwise feels fine.  Objective: Filed Vitals:   02/23/12 1406 02/23/12 1520 02/23/12 2100 02/24/12 0600  BP: 92/58 98/62 116/67 140/77  Pulse: 94  86 64  Temp: 99 F (37.2 C)  98.9 F (37.2 C) 98 F (36.7 C)  TempSrc: Oral     Resp: 18  18 14   Height:      Weight:      SpO2: 96%  98% 99%   Weight change:  No intake or output data in the 24 hours ending 02/24/12 0957  General: Alert, awake, oriented x3, in no acute distress.  HEENT: No bruits, no goiter.  Heart: Regular rate and rhythm, without murmurs, rubs, gallops.  Lungs: Good air movement + JVD Abdomen: Soft, nontender, nondistended, positive bowel sounds.  Neuro: Grossly intact, nonfocal.   Lab Results: No results found for this basename: NA:5,K:5,CL:5,CO2:5,GLUCOSE:5,BUN:5,CREATININE:5,CALCIUM:5,MG:5,PHOS:5 in the last 72 hours No results found for this basename: AST:2,ALT:2,ALKPHOS:2,BILITOT:2,PROT:2,ALBUMIN:2 in the last 72 hours No results found for this basename: LIPASE:2,AMYLASE:2 in the last 72 hours  Basename 02/24/12 0530 02/23/12 0325  WBC 7.0 7.5  NEUTROABS -- --  HGB 12.0 13.0  HCT 37.7 40.7  MCV 89.8 90.8  PLT 106* 111*   No results found for this basename: CKTOTAL:3,CKMB:3,CKMBINDEX:3,TROPONINI:3 in the last 72 hours No components found with this basename: POCBNP:3 No results found for this basename: DDIMER:2 in the last 72 hours No results found for this basename: HGBA1C:2 in the last 72 hours No results found for this basename: CHOL:2,HDL:2,LDLCALC:2,TRIG:2,CHOLHDL:2,LDLDIRECT:2 in the last 72 hours No results found for this basename: TSH,T4TOTAL,FREET3,T3FREE,THYROIDAB in the last 72 hours No results found for this basename: VITAMINB12:2,FOLATE:2,FERRITIN:2,TIBC:2,IRON:2,RETICCTPCT:2 in the last 72 hours  Micro Results: No results found for this or any previous visit (from the past 240 hour(s)).  Studies/Results: No results  found.  Medications: I have reviewed the patient's current medications.  Assessment and plan: Principal Problem:  *Pleuritic chest pain/ Massive PE (pulmonary embolism): -agree with IVC filter check doppler , due to her history of non compliance.  -Will go ahead and discuss with patient IVC filter. -INR sub therapeutic. -She did not follow up with PCP for INR checks post DC     ESRD (end stage renal disease) -Per renal. HD 3200 -ESRD with failed transplant due to noncompliance    Systolic CHF, chronic (EF 20%) Non ischemic cardiomyopathy/Hypotension -Difficult situation, does not tolerate Bp meds or Heart failure meds -Toprolol every other day as tolerate it. -Per heart failure team. -She is fluid restricted.  Thrombocytopenia -continue to monitor.  Noncomplaince: -Psyq consulted. -? palliative/hospice should get involved. -Very difficult situation, as the patient does not seem to have an insight on her multiple medical problems.  She takes our conversation to what it seems like very lightly and not seen the implications that her condition will have.    LOS: 4 days   Marinda Elk M.D. Pager: [phone] [facility] 02/24/2012, 9:57 AM

## 2012-02-24 NOTE — Consult Note (Signed)
Reason for Consult: refusing meds?   [name] Small is an 21 y.o. female.  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 cavitary lesion due to necrosis in 01/2012, on coumadin, now transferred  from [facility] where she presented with pleuritic chest pain and dyspnea with INR  1.5.  Pt was last admitted to [facility] 4/13 with chest pain and hemoptysis. Found on admission to have  V/Q scan with high probability for PE, bilaterally and pulmonary infarct. She was on heparin  bridge to coumadin. Echo showed EF 20%, seen by Dr. Sharyn Lull, started coreg, continue  lisinopril, plan repeat echo in a few months.  Discharged home.  Pt was admitted to [facility] on 4/29 with chest pain worse with inspiration, noted to  have mild hypoxemia. She was started on Heparin drip and started higher doses of coumadin,  INR was 1.9 on transfer to [facility]. Pt endorses mid sternal chest pressure, no radiation, no associated  symptoms, and also sharp pain that is worse when she breathes that is located in her high upper  right back.   pt was seen today. per pt she is not refusing any treatment here including dialysis. denies missing any follow up care after last the discharge but per family she did. she is living with her 2 friends now and has no transport. per mother pt can not arrange her transport for medical care but pt refuse that. pt plans to stay with her friends now after discharge but mother thinks it is not a good idea due to her care needs. pt gets upset and angry when any details of her answers were asked today. per pt she knows that she can die if she does not follow with her medical care. willing to get the treatment here now and plans to arrange her medicare transport after discharge. denies any depressive symptoms, able to laugh  and smile, no si, no hi, no AVH. At times pt says she knows all her medical problems but at the same she does not appeared to be much concerned about it.   Past Medical History  Diagnosis Date  . 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 Hemodialysis patient   . Hypertension   . Polycystic kidney disease   . Pulmonary emboli 01/2012    Bilateral, moderate clot burden, areas of pulmonary infarction and central necrosis  . CHF (congestive heart failure)   . Cardiomyopathy     Past Surgical History  Procedure Date  . Nephrectomy   . Av fistula placement   . Kidney transplant 2008    failed    Family History  Problem Relation Age of Onset  . Polycystic kidney disease Father     Social History:  reports that she has quit smoking. Her smoking use included Cigarettes. She smoked .2 packs per day. She does not have any smokeless tobacco history on file. She reports that she does not drink alcohol or use illicit drugs.  Allergies:  Allergies  Allergen Reactions  . Morphine And Related Itching    Medications: I have reviewed the patient's current medications.  Results for orders placed during the hospital encounter of 02/20/12 (from the past 48 hour(s))  HEPARIN LEVEL (UNFRACTIONATED)  Status: Abnormal   Collection Time   02/23/12  2:25 AM      Component Value Range Comment   Heparin Unfractionated 0.84 (*) 0.30 - 0.70 (IU/mL)   PROTIME-INR     Status: Abnormal   Collection Time   02/23/12  2:25 AM      Component Value Range Comment   Prothrombin Time 15.4 (*) 11.6 - 15.2 (seconds)    INR 1.19  0.00 - 1.49    APTT     Status: Abnormal   Collection Time   02/23/12  2:25 AM      Component Value Range Comment   aPTT 133 (*) 24 - 37 (seconds)   CBC     Status: Abnormal   Collection Time   02/23/12  3:25 AM      Component Value Range Comment   WBC 7.5  4.0 - 10.5 (K/uL)    RBC 4.48   3.87 - 5.11 (MIL/uL)    Hemoglobin 13.0  12.0 - 15.0 (g/dL)    HCT 16.1  09.6 - 04.5 (%)    MCV 90.8  78.0 - 100.0 (fL)    MCH 29.0  26.0 - 34.0 (pg)    MCHC 31.9  30.0 - 36.0 (g/dL)    RDW 40.9 (*) 81.1 - 15.5 (%)    Platelets 111 (*) 150 - 400 (K/uL)   GLUCOSE, CAPILLARY     Status: Normal   Collection Time   02/23/12  7:36 AM      Component Value Range Comment   Glucose-Capillary 89  70 - 99 (mg/dL)    Comment 1 Notify RN     HEPARIN LEVEL (UNFRACTIONATED)     Status: Normal   Collection Time   02/23/12  1:22 PM      Component Value Range Comment   Heparin Unfractionated 0.47  0.30 - 0.70 (IU/mL)   CBC     Status: Abnormal   Collection Time   02/24/12  5:30 AM      Component Value Range Comment   WBC 7.0  4.0 - 10.5 (K/uL)    RBC 4.20  3.87 - 5.11 (MIL/uL)    Hemoglobin 12.0  12.0 - 15.0 (g/dL)    HCT 91.4  78.2 - 95.6 (%)    MCV 89.8  78.0 - 100.0 (fL)    MCH 28.6  26.0 - 34.0 (pg)    MCHC 31.8  30.0 - 36.0 (g/dL)    RDW 21.3 (*) 08.6 - 15.5 (%)    Platelets 106 (*) 150 - 400 (K/uL) CONSISTENT WITH PREVIOUS RESULT  PROTIME-INR     Status: Abnormal   Collection Time   02/24/12  5:30 AM      Component Value Range Comment   Prothrombin Time 16.0 (*) 11.6 - 15.2 (seconds)    INR 1.25  0.00 - 1.49    HEPARIN LEVEL (UNFRACTIONATED)     Status: Normal   Collection Time   02/24/12  5:30 AM      Component Value Range Comment   Heparin Unfractionated 0.32  0.30 - 0.70 (IU/mL)   GLUCOSE, CAPILLARY     Status: Normal   Collection Time   02/24/12  7:23 AM      Component Value Range Comment   Glucose-Capillary 78  70 - 99 (mg/dL)   GLUCOSE, CAPILLARY     Status: Abnormal   Collection Time   02/24/12 11:29 AM      Component Value Range Comment   Glucose-Capillary 125 (*)  70 - 99 (mg/dL)   RENAL FUNCTION PANEL     Status: Abnormal   Collection Time   02/24/12 12:33 PM      Component Value Range Comment   Sodium 137  135 - 145 (mEq/L)    Potassium 4.2  3.5 - 5.1 (mEq/L)    Chloride 100   96 - 112 (mEq/L)    CO2 23  19 - 32 (mEq/L)    Glucose, Bld 83  70 - 99 (mg/dL)    BUN 32 (*) 6 - 23 (mg/dL)    Creatinine, Ser 1.61 (*) 0.50 - 1.10 (mg/dL)    Calcium 09.6  8.4 - 10.5 (mg/dL)    Phosphorus 4.6  2.3 - 4.6 (mg/dL)    Albumin 3.4 (*) 3.5 - 5.2 (g/dL)    GFR calc non Af Amer 6 (*) >90 (mL/min)    GFR calc Af Amer 7 (*) >90 (mL/min)     No results found.  ROS Blood pressure 118/80, pulse 86, temperature 98.3 F (36.8 C), temperature source Oral, resp. rate 18, height 5\' 3"  (1.6 m), weight 71.8 kg (158 lb 4.6 oz), last menstrual period 01/17/2012, SpO2 100.00%. Physical Exam. On bed comfortable  MSE:  oriented to time, place, person and situation. Appearance is cooperative and obese, On bed. No signs of tics or EPS. Behavior is cooperative, calm, appropriate. Speech is of regular rate, volume. Mood is appropriate and described as euthymic. Affect labile and inappropriate at times. Thought process is coherent, linear. Thought content includes no signs of hallucinations, delusions, obsessions, preoccupations with violence, homicidal or suicidal ideations without intent, thought or plan. There are no signs of perceptual impairment. Associations are intact. Immediate, recent and remote memory are fair. Approximate intelligence is average. Insight and judgment are age appropriate, poor.  Assessment/   Axis I;  mood d/o nos II.def III. See med hx IV; conflict with family V; 50  Plan:  1. At this time pt is not refusing any treatment. per nursing pt is willing to go for dialysis this afternoon.  2. if pt refuses any speficis treatment after discussing the potential risks and alternative treatment please let us know and we can assess her for capacity   3. For placement, please look into her wishes with the help of SW.  If she can not get the transport or if family can not help her for that and she refuses any other alternative living arrangements we can asses for the  capacity.  4. Please let me know in your progress note for the above mentioned recommendations and I can see her again tomorrow.  Wonda Cerise 02/24/2012, 10:14 PM

## 2012-02-25 ENCOUNTER — Inpatient Hospital Stay (HOSPITAL_COMMUNITY): Payer: Managed Care, Other (non HMO)

## 2012-02-25 ENCOUNTER — Encounter (HOSPITAL_COMMUNITY): Payer: Self-pay | Admitting: Radiology

## 2012-02-25 DIAGNOSIS — I959 Hypotension, unspecified: Secondary | ICD-10-CM

## 2012-02-25 DIAGNOSIS — Q613 Polycystic kidney, unspecified: Secondary | ICD-10-CM

## 2012-02-25 DIAGNOSIS — N186 End stage renal disease: Secondary | ICD-10-CM

## 2012-02-25 DIAGNOSIS — I10 Essential (primary) hypertension: Secondary | ICD-10-CM

## 2012-02-25 LAB — GLUCOSE, CAPILLARY: Glucose-Capillary: 102 mg/dL — ABNORMAL HIGH (ref 70–99)

## 2012-02-25 LAB — CBC
HCT: 40.1 % (ref 36.0–46.0)
Hemoglobin: 12.8 g/dL (ref 12.0–15.0)
MCV: 89.3 fL (ref 78.0–100.0)
Platelets: 93 10*3/uL — ABNORMAL LOW (ref 150–400)
RBC: 4.49 MIL/uL (ref 3.87–5.11)
WBC: 6.8 10*3/uL (ref 4.0–10.5)

## 2012-02-25 LAB — HEPARIN LEVEL (UNFRACTIONATED): Heparin Unfractionated: 0.52 IU/mL (ref 0.30–0.70)

## 2012-02-25 MED ORDER — HYDROMORPHONE HCL 2 MG PO TABS
1.0000 mg | ORAL_TABLET | ORAL | Status: DC | PRN
  Administered 2012-02-25: 1 mg via ORAL
  Filled 2012-02-25: qty 1

## 2012-02-25 MED ORDER — IOHEXOL 300 MG/ML  SOLN
100.0000 mL | Freq: Once | INTRAMUSCULAR | Status: AC | PRN
  Administered 2012-02-25: 50 mL via INTRAVENOUS

## 2012-02-25 MED ORDER — MIDAZOLAM HCL 5 MG/5ML IJ SOLN
INTRAMUSCULAR | Status: AC | PRN
  Administered 2012-02-25: 2 mg via INTRAVENOUS

## 2012-02-25 MED ORDER — FENTANYL CITRATE 0.05 MG/ML IJ SOLN
INTRAMUSCULAR | Status: AC | PRN
  Administered 2012-02-25: 100 ug via INTRAVENOUS

## 2012-02-25 MED ORDER — WARFARIN SODIUM 2.5 MG PO TABS
12.5000 mg | ORAL_TABLET | Freq: Once | ORAL | Status: AC
  Administered 2012-02-25: 12.5 mg via ORAL
  Filled 2012-02-25: qty 1

## 2012-02-25 NOTE — H&P (Signed)
[name] Small is an 21 y.o. female.   Chief Complaint: hemoptysis; Bilat pulmonary embolus 02/03/12 On anticoagulation- developed chest pain and sob- extension of PE (subtherapeutic INR) Now scheduled for retrievable inferior vena cava filter placement in IR 5/5 HPI: long medical hx; ESRD- kidney transplant- failure then nephrectomy; HTN; polycystic kidney dz; noncompliance  Past Medical History  Diagnosis Date  . 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 Hemodialysis patient   . Hypertension   . Polycystic kidney disease   . Pulmonary emboli 01/2012    Bilateral, moderate clot burden, areas of pulmonary infarction and central necrosis  . CHF (congestive heart failure)   . Cardiomyopathy     Past Surgical History  Procedure Date  . Nephrectomy   . Av fistula placement   . Kidney transplant 2008    failed    Family History  Problem Relation Age of Onset  . Polycystic kidney disease Father    Social History:  reports that she has quit smoking. Her smoking use included Cigarettes. She smoked .2 packs per day. She does not have any smokeless tobacco history on file. She reports that she does not drink alcohol or use illicit drugs.  Allergies:  Allergies  Allergen Reactions  . Morphine And Related Itching    Medications Prior to Admission  Medication Sig Dispense Refill  . calcium acetate (PHOSLO) 667 MG capsule Take 667 mg by mouth 3 (three) times daily with meals.        . carvedilol (COREG) 6.25 MG tablet Take 1 tablet (6.25 mg total) by mouth 2 (two) times daily with a meal.  60 tablet  0  . enalapril (VASOTEC) 10 MG tablet Take 10 mg by mouth daily.      . hydrALAZINE (APRESOLINE) 25 MG tablet Take 25 mg by mouth 2 (two) times daily with a meal.      . isosorbide dinitrate (ISORDIL) 20 MG tablet Take 20 mg by mouth 3 (three) times daily.      . multivitamin (RENA-VIT) TABS  tablet Take 1 tablet by mouth at bedtime.  30 tablet  0  . oxyCODONE-acetaminophen (PERCOCET) 5-325 MG per tablet Take 1-2 tablets by mouth every 4 (four) hours as needed. For pain       . promethazine (PHENERGAN) 25 MG tablet Take 25 mg by mouth every 6 (six) hours as needed. For nausea and vomiting       . warfarin (COUMADIN) 5 MG tablet Take 1 tablet (5 mg total) by mouth daily.  5 tablet  0    Results for orders placed during the hospital encounter of 02/20/12 (from the past 48 hour(s))  HEPARIN LEVEL (UNFRACTIONATED)     Status: Normal   Collection Time   02/23/12  1:22 PM      Component Value Range Comment   Heparin Unfractionated 0.47  0.30 - 0.70 (IU/mL)   CBC     Status: Abnormal   Collection Time   02/24/12  5:30 AM      Component Value Range Comment   WBC 7.0  4.0 - 10.5 (K/uL)    RBC 4.20  3.87 - 5.11 (MIL/uL)    Hemoglobin 12.0  12.0 - 15.0 (g/dL)    HCT 51.8  84.1 - 66.0 (%)    MCV 89.8  78.0 - 100.0 (fL)    MCH 28.6  26.0 - 34.0 (pg)    MCHC 31.8  30.0 - 36.0 (g/dL)    RDW 16.1 (*) 09.6 - 15.5 (%)    Platelets 106 (*) 150 - 400 (K/uL) CONSISTENT WITH PREVIOUS RESULT  PROTIME-INR     Status: Abnormal   Collection Time   02/24/12  5:30 AM      Component Value Range Comment   Prothrombin Time 16.0 (*) 11.6 - 15.2 (seconds)    INR 1.25  0.00 - 1.49    HEPARIN LEVEL (UNFRACTIONATED)     Status: Normal   Collection Time   02/24/12  5:30 AM      Component Value Range Comment   Heparin Unfractionated 0.32  0.30 - 0.70 (IU/mL)   GLUCOSE, CAPILLARY     Status: Normal   Collection Time   02/24/12  7:23 AM      Component Value Range Comment   Glucose-Capillary 78  70 - 99 (mg/dL)   GLUCOSE, CAPILLARY     Status: Abnormal   Collection Time   02/24/12 11:29 AM      Component Value Range Comment   Glucose-Capillary 125 (*) 70 - 99 (mg/dL)   RENAL FUNCTION PANEL     Status: Abnormal   Collection Time   02/24/12 12:33 PM      Component Value Range Comment   Sodium 137  135 - 145  (mEq/L)    Potassium 4.2  3.5 - 5.1 (mEq/L)    Chloride 100  96 - 112 (mEq/L)    CO2 23  19 - 32 (mEq/L)    Glucose, Bld 83  70 - 99 (mg/dL)    BUN 32 (*) 6 - 23 (mg/dL)    Creatinine, Ser 0.45 (*) 0.50 - 1.10 (mg/dL)    Calcium 40.9  8.4 - 10.5 (mg/dL)    Phosphorus 4.6  2.3 - 4.6 (mg/dL)    Albumin 3.4 (*) 3.5 - 5.2 (g/dL)    GFR calc non Af Amer 6 (*) >90 (mL/min)    GFR calc Af Amer 7 (*) >90 (mL/min)   GLUCOSE, CAPILLARY     Status: Abnormal   Collection Time   02/25/12  7:48 AM      Component Value Range Comment   Glucose-Capillary 102 (*) 70 - 99 (mg/dL)   CBC     Status: Abnormal   Collection Time   02/25/12  8:13 AM      Component Value Range Comment   WBC 6.8  4.0 - 10.5 (K/uL)    RBC 4.49  3.87 - 5.11 (MIL/uL)    Hemoglobin 12.8  12.0 - 15.0 (g/dL)    HCT 81.1  91.4 - 78.2 (%)    MCV 89.3  78.0 - 100.0 (fL)    MCH 28.5  26.0 - 34.0 (pg)    MCHC 31.9  30.0 - 36.0 (g/dL)    RDW 95.6 (*) 21.3 - 15.5 (%)    Platelets 93 (*) 150 - 400 (K/uL) CONSISTENT WITH PREVIOUS RESULT  PROTIME-INR     Status: Abnormal   Collection Time   02/25/12  8:13 AM      Component Value Range Comment   Prothrombin Time 15.7 (*) 11.6 - 15.2 (seconds)    INR 1.22  0.00 - 1.49    HEPARIN LEVEL (UNFRACTIONATED)     Status: Normal   Collection Time   02/25/12  8:13 AM      Component Value Range Comment   Heparin Unfractionated 0.52  0.30 - 0.70 (IU/mL)    No results found.  Review of Systems  Constitutional: Negative for fever.  Eyes: Negative for double vision.  Respiratory: Positive for cough and hemoptysis.   Cardiovascular: Positive for chest pain.  Gastrointestinal: Negative for nausea and vomiting.  Musculoskeletal: Negative for back pain.  Neurological: Positive for headaches.    Blood pressure 96/61, pulse 86, temperature 98.3 F (36.8 C), temperature source Oral, resp. rate 20, height 5\' 3"  (1.6 m), weight 151 lb 14.4 oz (68.9 kg), last menstrual period 01/17/2012, SpO2  100.00%. Physical Exam  Constitutional: She is oriented to person, place, and time. She appears well-developed and well-nourished.  Cardiovascular: Normal rate, regular rhythm and normal heart sounds.   No murmur heard. Respiratory: Effort normal and breath sounds normal. She has no wheezes.  GI: Soft. Bowel sounds are normal. There is no tenderness.  Musculoskeletal: Normal range of motion.  Neurological: She is alert and oriented to person, place, and time.  Skin: Skin is warm and dry.  Psychiatric: She has a normal mood and affect. Her behavior is normal. Judgment and thought content normal.     Assessment/Plan Bilat PE; placed on anticoagulation- sub therapeutic INR due to noncompliance- new PE Now scheduled for retrievable inferior vena cava filter placement 5/5 in IR Pt aware of procedure benefits and risks and agreeable to proceed. Consent signed and in chart  Renisha Cockrum A 02/25/2012, 10:17 AM

## 2012-02-25 NOTE — Consult Note (Signed)
Reason for Consult: refusing meds?   [name] Small is an 21 y.o. female.  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 cavitary lesion due to necrosis in 01/2012, on coumadin, now transferred  from [facility] where she presented with pleuritic chest pain and dyspnea with INR  1.5.   Interval Hx: pt was seen today. In angry mood. No family in room. Per pt she is going home tomorrow and per pt she has her own place to live. This is new information as per her mother yesterday she has no place to live. Pt was not willing to give more information at this time and made very poor eye contact and was not interested to talk. Primary team considering palliative care for her now.   Past Medical History  Diagnosis Date  . 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 Hemodialysis patient   . Hypertension   . Polycystic kidney disease   . Pulmonary emboli 01/2012    Bilateral, moderate clot burden, areas of pulmonary infarction and central necrosis  . CHF (congestive heart failure)   . Cardiomyopathy     Past Surgical History  Procedure Date  . Nephrectomy   . Av fistula placement   . Kidney transplant 2008    failed    Family History  Problem Relation Age of Onset  . Polycystic kidney disease Father     Social History:  reports that she has quit smoking. Her smoking use included Cigarettes. She smoked .2 packs per day. She does not have any smokeless tobacco history on file. She reports that she does not drink alcohol or use illicit drugs.  Allergies:  Allergies  Allergen Reactions  . Morphine And Related Itching    Medications: I have reviewed the patient's current medications.  Results for orders placed  during the hospital encounter of 02/20/12 (from the past 48 hour(s))  CBC     Status: Abnormal   Collection Time   02/24/12  5:30 AM      Component Value Range Comment   WBC 7.0  4.0 - 10.5 (K/uL)    RBC 4.20  3.87 - 5.11 (MIL/uL)    Hemoglobin 12.0  12.0 - 15.0 (g/dL)    HCT 16.1  09.6 - 04.5 (%)    MCV 89.8  78.0 - 100.0 (fL)    MCH 28.6  26.0 - 34.0 (pg)    MCHC 31.8  30.0 - 36.0 (g/dL)    RDW 40.9 (*) 81.1 - 15.5 (%)    Platelets 106 (*) 150 - 400 (K/uL) CONSISTENT WITH PREVIOUS RESULT  PROTIME-INR     Status: Abnormal   Collection Time   02/24/12  5:30 AM      Component Value Range Comment   Prothrombin Time 16.0 (*) 11.6 - 15.2 (seconds)    INR 1.25  0.00 - 1.49    HEPARIN LEVEL (UNFRACTIONATED)     Status: Normal   Collection Time   02/24/12  5:30 AM      Component Value Range Comment   Heparin Unfractionated 0.32  0.30 - 0.70 (IU/mL)   GLUCOSE, CAPILLARY     Status: Normal   Collection Time   02/24/12  7:23 AM      Component Value  Range Comment   Glucose-Capillary 78  70 - 99 (mg/dL)   GLUCOSE, CAPILLARY     Status: Abnormal   Collection Time   02/24/12 11:29 AM      Component Value Range Comment   Glucose-Capillary 125 (*) 70 - 99 (mg/dL)   RENAL FUNCTION PANEL     Status: Abnormal   Collection Time   02/24/12 12:33 PM      Component Value Range Comment   Sodium 137  135 - 145 (mEq/L)    Potassium 4.2  3.5 - 5.1 (mEq/L)    Chloride 100  96 - 112 (mEq/L)    CO2 23  19 - 32 (mEq/L)    Glucose, Bld 83  70 - 99 (mg/dL)    BUN 32 (*) 6 - 23 (mg/dL)    Creatinine, Ser 1.61 (*) 0.50 - 1.10 (mg/dL)    Calcium 09.6  8.4 - 10.5 (mg/dL)    Phosphorus 4.6  2.3 - 4.6 (mg/dL)    Albumin 3.4 (*) 3.5 - 5.2 (g/dL)    GFR calc non Af Amer 6 (*) >90 (mL/min)    GFR calc Af Amer 7 (*) >90 (mL/min)   GLUCOSE, CAPILLARY     Status: Abnormal   Collection Time   02/25/12  7:48 AM      Component Value Range Comment   Glucose-Capillary 102 (*) 70 - 99 (mg/dL)   CBC     Status: Abnormal    Collection Time   02/25/12  8:13 AM      Component Value Range Comment   WBC 6.8  4.0 - 10.5 (K/uL)    RBC 4.49  3.87 - 5.11 (MIL/uL)    Hemoglobin 12.8  12.0 - 15.0 (g/dL)    HCT 04.5  40.9 - 81.1 (%)    MCV 89.3  78.0 - 100.0 (fL)    MCH 28.5  26.0 - 34.0 (pg)    MCHC 31.9  30.0 - 36.0 (g/dL)    RDW 91.4 (*) 78.2 - 15.5 (%)    Platelets 93 (*) 150 - 400 (K/uL) CONSISTENT WITH PREVIOUS RESULT  PROTIME-INR     Status: Abnormal   Collection Time   02/25/12  8:13 AM      Component Value Range Comment   Prothrombin Time 15.7 (*) 11.6 - 15.2 (seconds)    INR 1.22  0.00 - 1.49    HEPARIN LEVEL (UNFRACTIONATED)     Status: Normal   Collection Time   02/25/12  8:13 AM      Component Value Range Comment   Heparin Unfractionated 0.52  0.30 - 0.70 (IU/mL)     Ir Ivc Filter Plmt / S&i /img Guid/mod Sed  02/25/2012  *RADIOLOGY REPORT*  Clinical data: Previous pulmonary emboli.  Chronic medical noncompliance including with her anticoagulation regimen.  INFERIOR VENACAVOGRAM IVC FILTER PLACEMENT UNDER FLUOROSCOPY  Technique and findings: The procedure, risks (including but not limited to bleeding, infection, organ damage), benefits, and alternatives were explained to the patient.  Questions regarding the procedure were encouraged and answered.  The patient understands and consents to the procedure. The right IJ vein appeared narrowed or occluded just above the tunneled IJ hemodialysis catheter, so a left-sided approach was selected. Patency of the   left IJ vein was confirmed with ultrasound with image documentation. An appropriate skin site was determined. Skin site was marked, prepped with chlorhexidine, and draped using maximum barrier technique. The region was infiltrated locally with 1% lidocaine.  Intravenous Fentanyl and Versed were  administered as conscious sedation during continuous cardiorespiratory monitoring by the radiology RN, with a total moderate sedation time of six minutes.  Under real-time  ultrasound guidance, the left IJ vein was accessed with a 21 gauge micropuncture needle; the needle tip within the vein was confirmed with ultrasound image documentation.   The needle was exchanged over a 018 guidewire for a transitional dilator, which allow advancement of the [facility] wire into the IVC with the aid of a Kumpe catheter. A long 6 French vascular sheath was placed for inferior venacavography. This demonstrated no caval thrombus. Renal vein inflows were evident.  The TrapEase IVC filter was advanced through the sheath and successfully deployed under fluoroscopy at the L2-3 level. Followup cavagram demonstrates stable filter position  and no evident complication. The sheath was removed and hemostasis achieved at the site. No immediate complication.  IMPRESSION: 1.  Normal IVC. No thrombus or significant anatomic variation. 2.  Technically successful infrarenal IVC filter placement.  Original Report Authenticated By: Thora Lance III, M.D.    ROS  Blood pressure 126/75, pulse 88, temperature 98.4 F (36.9 C), temperature source Oral, resp. rate 18, height 5\' 3"  (1.6 m), weight 68.9 kg (151 lb 14.4 oz), last menstrual period 01/26/2012, SpO2 99.00%. Physical Exam . On bed comfortable  MSE:  oriented to time, place, person and situation. Appearance is cooperative and obese, On bed. No signs of tics or EPS. Behavior is cooperative, calm, appropriate. Speech is of regular rate, volume. Mood is angry Affect labile and inappropriate at times. Thought process is coherent, linear. Thought content includes no signs of hallucinations, delusions, obsessions, preoccupations with violence, homicidal or suicidal ideations without intent, thought or plan. There are no signs of perceptual impairment. Associations are intact. Immediate, recent and remote memory are fair. Approximate intelligence is average. Insight and judgment are  poor.  Assessment/   Axis I;  mood d/o nos II.def III. See med  hx IV; conflict with family V; 50  Plan:  1. At this time pt is not refusing any treatment and cooperating with the treatment team.   2. I thinking involving palliative care is good idea at this time if primary team think it can help her. I agreed with the treatment it is very difficult situation.   3. if pt refuses any speficis treatment after discussing the potential risks and alternative treatment please let us know and we can assess her for capacity.  For placement, please look into her wishes with the help of SW.  If she can not get the transport or if family can not help her for that and she refuses any other alternative living arrangements we can asses for the capacity   4. Will sign off and please re-consult if needed  Wonda Cerise 02/25/2012, 7:20 PM

## 2012-02-25 NOTE — Progress Notes (Signed)
Spoke with Dr. David Stall who is requesting a Goals of care conference with this patient.  He was in the process of talking with the patient and family about our service.  He will call me back when he has gained permission for me to approach this family.  Gricelda Foland L. Ladona Ridgel, MD MBA The Palliative Medicine Team at [facility] Phone: [phone] Pager: [phone]

## 2012-02-25 NOTE — Progress Notes (Signed)
In to assess pt.  Pt immediately began screaming and crying of intense pain in the R groin/upper thigh area.  Pain radiating up leg to abdomen.  Pt unable to lay down.  Area examined and no remarkable findings.  VS 124/82 HR 95.  MD notified.  No new orders rec'd. Emotional support provided. Demarr Kluever, [facility]

## 2012-02-25 NOTE — H&P (Signed)
[name] Small is an 21 y.o. female.   Chief Complaint: hemoptysis; Bilat pulmonary embolus 02/03/12 On anticoagulation- developed chest pain and sob- extension of PE (subtherapeutic INR) Now scheduled for  inferior vena cava filter placement in IR 5/5 HPI: long medical hx; ESRD- kidney transplant- failure then nephrectomy; HTN; polycystic kidney dz; noncompliance  Past Medical History  Diagnosis Date  . 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 Hemodialysis patient   . Hypertension   . Polycystic kidney disease   . Pulmonary emboli 01/2012    Bilateral, moderate clot burden, areas of pulmonary infarction and central necrosis  . CHF (congestive heart failure)   . Cardiomyopathy     Past Surgical History  Procedure Date  . Nephrectomy   . Av fistula placement   . Kidney transplant 2008    failed    Family History  Problem Relation Age of Onset  . Polycystic kidney disease Father    Social History:  reports that she has quit smoking. Her smoking use included Cigarettes. She smoked .2 packs per day. She does not have any smokeless tobacco history on file. She reports that she does not drink alcohol or use illicit drugs.  Allergies:  Allergies  Allergen Reactions  . Morphine And Related Itching    Medications Prior to Admission  Medication Sig Dispense Refill  . calcium acetate (PHOSLO) 667 MG capsule Take 667 mg by mouth 3 (three) times daily with meals.        . carvedilol (COREG) 6.25 MG tablet Take 1 tablet (6.25 mg total) by mouth 2 (two) times daily with a meal.  60 tablet  0  . enalapril (VASOTEC) 10 MG tablet Take 10 mg by mouth daily.      . hydrALAZINE (APRESOLINE) 25 MG tablet Take 25 mg by mouth 2 (two) times daily with a meal.      . isosorbide dinitrate (ISORDIL) 20 MG tablet Take 20 mg by mouth 3 (three) times daily.      . multivitamin (RENA-VIT) TABS tablet Take 1  tablet by mouth at bedtime.  30 tablet  0  . oxyCODONE-acetaminophen (PERCOCET) 5-325 MG per tablet Take 1-2 tablets by mouth every 4 (four) hours as needed. For pain       . promethazine (PHENERGAN) 25 MG tablet Take 25 mg by mouth every 6 (six) hours as needed. For nausea and vomiting       . warfarin (COUMADIN) 5 MG tablet Take 1 tablet (5 mg total) by mouth daily.  5 tablet  0    Results for orders placed during the hospital encounter of 02/20/12 (from the past 48 hour(s))  HEPARIN LEVEL (UNFRACTIONATED)     Status: Normal   Collection Time   02/23/12  1:22 PM      Component Value Range Comment   Heparin Unfractionated 0.47  0.30 - 0.70 (IU/mL)   CBC     Status: Abnormal   Collection Time   02/24/12  5:30 AM      Component Value Range Comment   WBC 7.0  4.0 - 10.5 (K/uL)    RBC 4.20  3.87 - 5.11 (MIL/uL)    Hemoglobin 12.0  12.0 - 15.0 (g/dL)    HCT 16.1  09.6 - 04.5 (%)    MCV 89.8  78.0 - 100.0 (fL)    MCH 28.6  26.0 - 34.0 (pg)    MCHC 31.8  30.0 - 36.0 (g/dL)    RDW 16.1 (*) 09.6 - 15.5 (%)    Platelets 106 (*) 150 - 400 (K/uL) CONSISTENT WITH PREVIOUS RESULT  PROTIME-INR     Status: Abnormal   Collection Time   02/24/12  5:30 AM      Component Value Range Comment   Prothrombin Time 16.0 (*) 11.6 - 15.2 (seconds)    INR 1.25  0.00 - 1.49    HEPARIN LEVEL (UNFRACTIONATED)     Status: Normal   Collection Time   02/24/12  5:30 AM      Component Value Range Comment   Heparin Unfractionated 0.32  0.30 - 0.70 (IU/mL)   GLUCOSE, CAPILLARY     Status: Normal   Collection Time   02/24/12  7:23 AM      Component Value Range Comment   Glucose-Capillary 78  70 - 99 (mg/dL)   GLUCOSE, CAPILLARY     Status: Abnormal   Collection Time   02/24/12 11:29 AM      Component Value Range Comment   Glucose-Capillary 125 (*) 70 - 99 (mg/dL)   RENAL FUNCTION PANEL     Status: Abnormal   Collection Time   02/24/12 12:33 PM      Component Value Range Comment   Sodium 137  135 - 145 (mEq/L)     Potassium 4.2  3.5 - 5.1 (mEq/L)    Chloride 100  96 - 112 (mEq/L)    CO2 23  19 - 32 (mEq/L)    Glucose, Bld 83  70 - 99 (mg/dL)    BUN 32 (*) 6 - 23 (mg/dL)    Creatinine, Ser 0.45 (*) 0.50 - 1.10 (mg/dL)    Calcium 40.9  8.4 - 10.5 (mg/dL)    Phosphorus 4.6  2.3 - 4.6 (mg/dL)    Albumin 3.4 (*) 3.5 - 5.2 (g/dL)    GFR calc non Af Amer 6 (*) >90 (mL/min)    GFR calc Af Amer 7 (*) >90 (mL/min)   GLUCOSE, CAPILLARY     Status: Abnormal   Collection Time   02/25/12  7:48 AM      Component Value Range Comment   Glucose-Capillary 102 (*) 70 - 99 (mg/dL)   CBC     Status: Abnormal   Collection Time   02/25/12  8:13 AM      Component Value Range Comment   WBC 6.8  4.0 - 10.5 (K/uL)    RBC 4.49  3.87 - 5.11 (MIL/uL)    Hemoglobin 12.8  12.0 - 15.0 (g/dL)    HCT 81.1  91.4 - 78.2 (%)    MCV 89.3  78.0 - 100.0 (fL)    MCH 28.5  26.0 - 34.0 (pg)    MCHC 31.9  30.0 - 36.0 (g/dL)    RDW 95.6 (*) 21.3 - 15.5 (%)    Platelets 93 (*) 150 - 400 (K/uL) CONSISTENT WITH PREVIOUS RESULT  PROTIME-INR     Status: Abnormal   Collection Time   02/25/12  8:13 AM      Component Value Range Comment   Prothrombin Time 15.7 (*) 11.6 - 15.2 (seconds)    INR 1.22  0.00 - 1.49    HEPARIN LEVEL (UNFRACTIONATED)     Status: Normal   Collection Time   02/25/12  8:13 AM      Component Value Range Comment   Heparin Unfractionated 0.52  0.30 - 0.70 (IU/mL)    No results found.  Review of Systems  Constitutional: Negative for fever.  Eyes: Negative for double vision.  Respiratory: Positive for cough and hemoptysis.   Cardiovascular: Positive for chest pain.  Gastrointestinal: Negative for nausea and vomiting.  Musculoskeletal: Negative for back pain.  Neurological: Positive for headaches.    Blood pressure 96/61, pulse 86, temperature 98.3 F (36.8 C), temperature source Oral, resp. rate 20, height 5\' 3"  (1.6 m), weight 151 lb 14.4 oz (68.9 kg), last menstrual period 01/17/2012, SpO2 100.00%. Physical Exam    Constitutional: She is oriented to person, place, and time. She appears well-developed and well-nourished.  Cardiovascular: Normal rate, regular rhythm and normal heart sounds.   No murmur heard. Respiratory: Effort normal and breath sounds normal. She has no wheezes.  GI: Soft. Bowel sounds are normal. There is no tenderness.  Musculoskeletal: Normal range of motion.  Neurological: She is alert and oriented to person, place, and time.  Skin: Skin is warm and dry.  Psychiatric: She has a normal mood and affect. Her behavior is normal. Judgment and thought content normal.     Assessment/Plan Bilat PE; placed on anticoagulation- sub therapeutic INR due to noncompliance- new PE Now scheduled for  inferior vena cava filter placement 5/5 in IR. Anticipate longterm/lifelong need for filter due to recurrent medical noncompliance. Plan permanent device. Pt aware of procedure benefits and risks and agreeable to proceed. Consent signed and in chart  TURPIN,PAMELA A 02/25/2012, 10:17 AM

## 2012-02-25 NOTE — Progress Notes (Signed)
ANTICOAGULATION CONSULT NOTE - Follow Up Consult  Pharmacy Consult for Heparin & Coumadin Day#3/5 overlap Indication: pulmonary embolus- possible extension  Allergies  Allergen Reactions  . Morphine And Related Itching    Patient Measurements: Height: 5\' 3"  (160 cm) Weight: 151 lb 14.4 oz (68.9 kg) IBW/kg (Calculated) : 52.4   Vital Signs: Temp: 98.3 F (36.8 C) (05/05 0600) Temp src: Oral (05/05 0600) BP: 96/61 mmHg (05/05 0600) Pulse Rate: 86  (05/05 0600)  Labs:  Basename 02/25/12 0813 02/24/12 1233 02/24/12 0530 02/23/12 1322 02/23/12 0325 02/23/12 0225  HGB 12.8 -- 12.0 -- -- --  HCT 40.1 -- 37.7 -- 40.7 --  PLT 93* -- 106* -- 111* --  APTT -- -- -- -- -- 133*  LABPROT 15.7* -- 16.0* -- -- 15.4*  INR 1.22 -- 1.25 -- -- 1.19  HEPARINUNFRC 0.52 -- 0.32 0.47 -- --  CREATININE -- 8.79* -- -- -- --  CKTOTAL -- -- -- -- -- --  CKMB -- -- -- -- -- --  TROPONINI -- -- -- -- -- --   Estimated Creatinine Clearance: 9.5 ml/min (by C-G formula based on Cr of 8.79).  Assessment: 21 yo female with ESRD, HF EF 20% on Heparin & Coumadin for PE. Heparin level (0.52) is at goa l on 850 units/hr.  No bleeding noted, h/h stable. INR (1.22) is below-goal, planning on IVC filter so I will not increase dose too much.   Noted that platelets of 93 for today (241 admit ). During previous admission in April 2013, platelets with similar drop and HIT-panel was negative (02/05/12).  Recheck HIT panel is pending.   VS fluctuate 90/50 140/77 HR 64 to 80's - not on any BP meds at this time  Goal of Therapy:  INR 2-3 & heparin level 0.3-0.7   Plan:  1. Continue IV heparin to 850 units/hr.  2. Coumadin 12.5 mg po today.  3.daily HL, cbc, INR.   Leota Sauers Pharm.D. CPP, BCPS Clinical Pharmacist [phone] 02/25/2012 9:07 AM

## 2012-02-25 NOTE — Procedures (Signed)
IVC gram negative IVC filter placed No complication No blood loss. See complete dictation in [facility].

## 2012-02-25 NOTE — Plan of Care (Signed)
Problem: Consults Goal: Diagnosis - Venous Thromboembolism (VTE) Choose a selection  Outcome: Completed/Met Date Met:  02/25/12 PE (Pulmonary Embolism)

## 2012-02-25 NOTE — Progress Notes (Signed)
Patient [name] Small      DOB: November 29, 1990      UJW:[id number]  Dr. David Stall spoke with the patient and her mother. Mother distraught.  Dr. Radonna Ricker Robb Matar requests that we not contact them today for GOC , but they are open to meeting.  Will respect wish for time before calling.  Will contact them tomorrow and schedule a time.  Karlea Mckibbin L. Ladona Ridgel, MD MBA The Palliative Medicine Team at [facility] Phone: [phone] Pager: [phone]

## 2012-02-25 NOTE — Progress Notes (Signed)
Subjective: -feels fine. -agreed with IVC filter  Objective: Filed Vitals:   02/25/12 0130 02/25/12 0208 02/25/12 0258 02/25/12 0600  BP: 109/92 86/59 92/65  96/61  Pulse: 93 97 97 86  Temp: 97.6 F (36.4 C) 98.1 F (36.7 C)  98.3 F (36.8 C)  TempSrc: Oral   Oral  Resp: 23   20  Height:      Weight: 68.9 kg (151 lb 14.4 oz)     SpO2: 98% 100%  100%   Weight change:   Intake/Output Summary (Last 24 hours) at 02/25/12 0818 Last data filed at 02/25/12 0400  Gross per 24 hour  Intake 949.02 ml  Output   2700 ml  Net -1750.98 ml    General: Alert, awake, oriented x3, in no acute distress.  HEENT: No bruits, no goiter.  Heart: Regular rate and rhythm, without murmurs, rubs, gallops.  Lungs: Good air movement + JVD Abdomen: Soft, nontender, nondistended, positive bowel sounds.  Neuro: Grossly intact, nonfocal.   Lab Results:  Basename 02/24/12 1233  NA 137  K 4.2  CL 100  CO2 23  GLUCOSE 83  BUN 32*  CREATININE 8.79*  CALCIUM 10.5  MG --  PHOS 4.6    Basename 02/24/12 1233  AST --  ALT --  ALKPHOS --  BILITOT --  PROT --  ALBUMIN 3.4*   No results found for this basename: LIPASE:2,AMYLASE:2 in the last 72 hours  Basename 02/24/12 0530 02/23/12 0325  WBC 7.0 7.5  NEUTROABS -- --  HGB 12.0 13.0  HCT 37.7 40.7  MCV 89.8 90.8  PLT 106* 111*   No results found for this basename: CKTOTAL:3,CKMB:3,CKMBINDEX:3,TROPONINI:3 in the last 72 hours No components found with this basename: POCBNP:3 No results found for this basename: DDIMER:2 in the last 72 hours No results found for this basename: HGBA1C:2 in the last 72 hours No results found for this basename: CHOL:2,HDL:2,LDLCALC:2,TRIG:2,CHOLHDL:2,LDLDIRECT:2 in the last 72 hours No results found for this basename: TSH,T4TOTAL,FREET3,T3FREE,THYROIDAB in the last 72 hours No results found for this basename: VITAMINB12:2,FOLATE:2,FERRITIN:2,TIBC:2,IRON:2,RETICCTPCT:2 in the last 72 hours  Micro Results: No  results found for this or any previous visit (from the past 240 hour(s)).  Studies/Results: No results found.  Medications: I have reviewed the patient's current medications.  Assessment and plan: Principal Problem:  *Pleuritic chest pain/ Massive PE (pulmonary embolism): -agree with IVC filter check doppler , due to her history of non compliance.  -Patient agreed with IV filter, doppler showed, no DVT but there is some debris in the left common femoral vein could suggest recent clot. -INR sub therapeutic.    ESRD (end stage renal disease) -Per renal. 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 -Toprolol every other day as tolerate it. -She is fluid restricted.  Thrombocytopenia -continue to monitor. -most likely 2/2 to clot burden. -Plt's decrease by 50% -Hit pending.  Noncomplaince: -Psyq consulted, ? Capacity. -? palliative/hospice should get involved. -Very difficult situation, as the patient does not seem to have an insight on her multiple medical problems.  She takes our conversation to what it seems like very lightly and not seen the implications that her condition will have.    LOS: 5 days   Marinda Elk M.D. Pager: [phone] [facility] 02/25/2012, 8:18 AM

## 2012-02-25 NOTE — Progress Notes (Signed)
Call by HD to rec report on pt.  In room with pt and Dr. Robb Matar discussing POC and prognosis.  Informed pt that HD would like to pick her up for tx.  Pt requested that treatment be rescheduled for the afternoon as she had just finished talking on the severity of her situation with MD.  Information relayed to HD RN.  Brynn Reznik, [facility]

## 2012-02-25 NOTE — Progress Notes (Signed)
Patient ID: [name], female   DOB: 09/05/1991, 21 y.o.   MRN: [id number]    [facility] Progress Note    Subjective:   Improving chest pain noted- occasional SOB on exertion. Tolerated HD well yesterday without problems.   Objective:   BP 96/61  Pulse 86  Temp(Src) 98.3 F (36.8 C) (Oral)  Resp 20  Ht 5\' 3"  (1.6 m)  Wt 68.9 kg (151 lb 14.4 oz)  BMI 26.91 kg/m2  SpO2 100%  LMP 01/17/2012  Physical Exam: WUJ:[id number] resting in bed- sleeping until aroused AOZ:HYQMV RRR, S1 with loud S2  Resp:CTA bilaterally, no rales HQI:ONGE, obese, NT, BS normal Ext:1-2+ LE edema (rt> lt)  Labs: BMET  Lab 02/24/12 1233 02/20/12 2320  NA 137 139  K 4.2 4.3  CL 100 98  CO2 23 24  GLUCOSE 83 85  BUN 32* 20  CREATININE 8.79* 6.80*  ALB -- --  CALCIUM 10.5 10.5  PHOS 4.6 3.9   CBC  Lab 02/25/12 0813 02/24/12 0530 02/23/12 0325 02/22/12 0514 02/20/12 2320  WBC 6.8 7.0 7.5 12.2* --  NEUTROABS -- -- -- -- 4.0  HGB 12.8 12.0 13.0 11.8* --  HCT 40.1 37.7 40.7 37.2 --  MCV 89.3 89.8 90.8 86.1 --  PLT 93* 106* 111* 218 --    @IMGRELPRIORS @ Medications:      . acetaminophen  500 mg Oral TID  . calcium acetate  667 mg Oral TID WC  . multivitamin  1 tablet Oral QHS  . sodium chloride  3 mL Intravenous Q12H  . warfarin  10 mg Oral ONCE-1800  . Warfarin - Pharmacist Dosing Inpatient   Does not apply q1800     Assessment/ Plan:   1. CP/pleurtic with hx recent PE: Mainly pleuritic and associated with recent PE- ongoing anticoagulation (INR today 1.25) and pain control. Plans noted for IVC filter. NEEDS A PCP TO FOLLOW ANTICOAGULATION at DC. 2. ESRD S/P failed renal tx - secondary to missed immunosuppression doses/rejection- Continue dialysis on TTS schedule; No acute needs for HD identified today. Would NOT refer/plan for KTx at this juncture. 3. Hypertension/volume - off all BP meds; re-eval EDW- beta blocker therapy limited by hypotension 4. Anemia - Hgb  >11.5, no ESA; on 100 Venofer q 2 weeks; hold for now 5. Metabolic bone disease - zemplar not needed iPTH 63; Ca borderline elevated, will use a 2 Ca bath here 6. Nutrition: On renal diet- monitor albumin and consider ONS if <3.8 7. CM - longstanding hypertension; 2 D Echo shows significantly low EF and hypokinesis (global).   Zetta Bills, MD 02/25/2012, 8:45 AM

## 2012-02-26 ENCOUNTER — Inpatient Hospital Stay (HOSPITAL_COMMUNITY): Payer: Managed Care, Other (non HMO)

## 2012-02-26 DIAGNOSIS — Q613 Polycystic kidney, unspecified: Secondary | ICD-10-CM

## 2012-02-26 DIAGNOSIS — I10 Essential (primary) hypertension: Secondary | ICD-10-CM

## 2012-02-26 DIAGNOSIS — N186 End stage renal disease: Secondary | ICD-10-CM

## 2012-02-26 DIAGNOSIS — R5383 Other fatigue: Secondary | ICD-10-CM

## 2012-02-26 DIAGNOSIS — R5381 Other malaise: Secondary | ICD-10-CM

## 2012-02-26 DIAGNOSIS — I959 Hypotension, unspecified: Secondary | ICD-10-CM

## 2012-02-26 DIAGNOSIS — R0602 Shortness of breath: Secondary | ICD-10-CM

## 2012-02-26 LAB — GLUCOSE, CAPILLARY: Glucose-Capillary: 92 mg/dL (ref 70–99)

## 2012-02-26 LAB — HEPARIN INDUCED THROMBOCYTOPENIA PNL
UFH High Dose UFH H: 0 % Release
UFH Low Dose 0.1 IU/mL: 0 % Release
UFH Low Dose 0.5 IU/mL: 0 % Release
UFH SRA Result: NEGATIVE

## 2012-02-26 LAB — CBC
HCT: 37.6 % (ref 36.0–46.0)
Hemoglobin: 11.7 g/dL — ABNORMAL LOW (ref 12.0–15.0)
MCH: 28.3 pg (ref 26.0–34.0)
MCHC: 31.1 g/dL (ref 30.0–36.0)
MCV: 91 fL (ref 78.0–100.0)
RBC: 4.13 MIL/uL (ref 3.87–5.11)

## 2012-02-26 MED ORDER — HYDROMORPHONE HCL 2 MG PO TABS
1.0000 mg | ORAL_TABLET | Freq: Once | ORAL | Status: AC
  Administered 2012-02-26: 1 mg via ORAL
  Filled 2012-02-26: qty 1

## 2012-02-26 MED ORDER — WARFARIN SODIUM 2.5 MG PO TABS
12.5000 mg | ORAL_TABLET | Freq: Once | ORAL | Status: AC
  Administered 2012-02-26: 12.5 mg via ORAL
  Filled 2012-02-26: qty 1

## 2012-02-26 MED ORDER — HYDROMORPHONE HCL 2 MG PO TABS
1.0000 mg | ORAL_TABLET | ORAL | Status: DC | PRN
  Administered 2012-02-26 – 2012-02-27 (×4): 1 mg via ORAL
  Filled 2012-02-26 (×4): qty 1

## 2012-02-26 MED ORDER — HYDROMORPHONE HCL 2 MG PO TABS
1.0000 mg | ORAL_TABLET | ORAL | Status: DC | PRN
  Administered 2012-02-26: 2 mg via ORAL
  Filled 2012-02-26: qty 1

## 2012-02-26 MED ORDER — ENOXAPARIN SODIUM 80 MG/0.8ML ~~LOC~~ SOLN
70.0000 mg | SUBCUTANEOUS | Status: DC
  Administered 2012-02-26: 70 mg via SUBCUTANEOUS
  Filled 2012-02-26 (×2): qty 0.8

## 2012-02-26 NOTE — Progress Notes (Signed)
This was a follow-up visit.  I was informed by the chaplain on-call Sunday that pt had requested a visit from me.   Palliative Care team member NP Virgia Land. was in the room with the pt and her parents.  I was invited to stay.  Pt is struggling with many issues medically, socially, psychologically, and spiritually.  After the meeting Turkey did not wish to talk to me further at that time. I explained if she changed her mind that she could ask the nurse to page me. I will continue to follow. Please page me if requested. Dellie Catholic  [phone] personal pager   [phone] on-call pager

## 2012-02-26 NOTE — Progress Notes (Signed)
[facility] Progress Note  Subjective:  Patient and family are currently in a meeting discussing goals of care, compliance and "how to make things go a little more smoothly" so only limited physical evaluation done.  Patient states she has no questions for me but family was under the impression that we were planning to "reduce her dialysis time to 2 hours shen her blood pressures get low" - explained that this is not really rational why longer treatment times are "gentler" in terms of accomplishing dialysis goals.    She also asked about when she was to get another AVF and I note in reviewing records that she is to followup with Dr. Myra Gianotti in about 2 weeks to plan for another access. Objective Filed Vitals:   02/25/12 2100 02/26/12 0115 02/26/12 0500 02/26/12 0659  BP: 120/81 130/93 129/70   Pulse: 70  51   Temp: 98.7 F (37.1 C)  98.5 F (36.9 C)   TempSrc:      Resp: 18  18   Height:      Weight:    69.1 kg (152 lb 5.4 oz)  SpO2: 100%  100%    Physical Exam General: Pt appears comfortable and in conversation with family and Palliative Care. Access is permcath Additional exam will be done later today Assessment/Plan: 1. CP/pleuritic with hx recent PE: Ongoing anticoagulation (INR today 1.25) and pain control. S/p IVC filter. Marland Kitchen NEEDS A PCP TO FOLLOW ANTICOAGULATION at DC. 2. ESRD S/P failed renal tx - secondary to missed immunosuppression doses/rejection- Continue dialysis on TTS schedule; No acute needs for HD identified today. Would NOT refer/plan for KTx at this juncture as she is presently not a candidate; no plans to reduce dialysis time (not sure where that came from...) 3. Hypertension/volume - off all BP meds; re-eval EDW- beta blocker therapy limited by hypotension 4. Anemia - Hgb >11.5, no ESA; on 100 Venofer q 2 weeks; hold for now 5. Metabolic bone disease - zemplar not needed iPTH 63; Ca borderline elevated, will use a 2 Ca bath here 6. Nutrition: On  renal diet- monitor albumin and consider ONS if <3.8       7. CM - longstanding hypertension; 2 D Echo shows significantly low EF and hypokinesis (global).        8. Dialysis permanent access issues - for outpt f/u with [facility] in about 2 weeks  Additional Objective Labs: Basic Metabolic Panel:  Lab 02/24/12 0865 02/20/12 2320  NA 137 139  K 4.2 4.3  CL 100 98  CO2 23 24  GLUCOSE 83 85  BUN 32* 20  CREATININE 8.79* 6.80*  CALCIUM 10.5 10.5  ALB -- --  PHOS 4.6 3.9   CBC:  Lab 02/26/12 0710 02/25/12 0813 02/24/12 0530 02/23/12 0325 02/22/12 0514 02/20/12 2320  WBC 6.4 6.8 7.0 -- -- --  NEUTROABS -- -- -- -- -- 4.0  HGB 11.7* 12.8 12.0 -- -- --  HCT 37.6 40.1 37.7 -- -- --  MCV 91.0 89.3 89.8 90.8 86.1 --  PLT 97* 93* 106* -- -- --   Lab Results  Component Value Date   INR 1.54* 02/26/2012   INR 1.22 02/25/2012   INR 1.25 02/24/2012   Studies/Results: Ir Ivc Filter Plmt / S&i /img Guid/mod Sed  02/25/2012  *RADIOLOGY REPORT*  Clinical data: Previous pulmonary emboli.  Chronic medical noncompliance including with her anticoagulation regimen.  INFERIOR VENACAVOGRAM IVC FILTER PLACEMENT UNDER FLUOROSCOPY  Technique and findings: The procedure, risks (including but  not limited to bleeding, infection, organ damage), benefits, and alternatives were explained to the patient.  Questions regarding the procedure were encouraged and answered.  The patient understands and consents to the procedure. The right IJ vein appeared narrowed or occluded just above the tunneled IJ hemodialysis catheter, so a left-sided approach was selected. Patency of the   left IJ vein was confirmed with ultrasound with image documentation. An appropriate skin site was determined. Skin site was marked, prepped with chlorhexidine, and draped using maximum barrier technique. The region was infiltrated locally with 1% lidocaine.  Intravenous Fentanyl and Versed were administered as conscious sedation during continuous  cardiorespiratory monitoring by the radiology RN, with a total moderate sedation time of six minutes.  Under real-time ultrasound guidance, the left IJ vein was accessed with a 21 gauge micropuncture needle; the needle tip within the vein was confirmed with ultrasound image documentation.   The needle was exchanged over a 018 guidewire for a transitional dilator, which allow advancement of the [facility] wire into the IVC with the aid of a Kumpe catheter. A long 6 French vascular sheath was placed for inferior venacavography. This demonstrated no caval thrombus. Renal vein inflows were evident.  The TrapEase IVC filter was advanced through the sheath and successfully deployed under fluoroscopy at the L2-3 level. Followup cavagram demonstrates stable filter position  and no evident complication. The sheath was removed and hemostasis achieved at the site. No immediate complication.  IMPRESSION: 1.  Normal IVC. No thrombus or significant anatomic variation. 2.  Technically successful infrarenal IVC filter placement.  Original Report Authenticated By: Osa Craver, M.D.   Medications: . DISCONTD: heparin 850 Units/hr (02/25/12 2332)   . acetaminophen  500 mg Oral TID  . calcium acetate  667 mg Oral TID WC  . enoxaparin (LOVENOX) injection  70 mg Subcutaneous Q24H  . HYDROmorphone  1 mg Oral Once  . multivitamin  1 tablet Oral QHS  . sodium chloride  3 mL Intravenous Q12H  . warfarin  12.5 mg Oral ONCE-1800  . warfarin  12.5 mg Oral ONCE-1800  . Warfarin - Pharmacist Dosing Inpatient   Does not apply q1800    I  have reviewed scheduled and prn medications. Cristin Szatkowski B 02/26/2012,10:31 AM  LOS: 6 days

## 2012-02-26 NOTE — Progress Notes (Addendum)
Subjective: -complaning of abdominal pain   Objective: Filed Vitals:   02/25/12 2100 02/26/12 0115 02/26/12 0500 02/26/12 0659  BP: 120/81 130/93 129/70   Pulse: 70  51   Temp: 98.7 F (37.1 C)  98.5 F (36.9 C)   TempSrc:      Resp: 18  18   Height:      Weight:    69.1 kg (152 lb 5.4 oz)  SpO2: 100%  100%    Weight change: -2.5 kg (-5 lb 8.2 oz)  Intake/Output Summary (Last 24 hours) at 02/26/12 0915 Last data filed at 02/26/12 0600  Gross per 24 hour  Intake    581 ml  Output      0 ml  Net    581 ml    General: Alert, awake, oriented x3, in no acute distress.  HEENT: No bruits, no goiter.  Heart: Regular rate and rhythm, without murmurs, rubs, gallops.  Lungs: Good air movement + JVD Abdomen: Soft, nontender, nondistended, positive bowel sounds.  Neuro: Grossly intact, nonfocal.   Lab Results:  Basename 02/24/12 1233  NA 137  K 4.2  CL 100  CO2 23  GLUCOSE 83  BUN 32*  CREATININE 8.79*  CALCIUM 10.5  MG --  PHOS 4.6    Basename 02/24/12 1233  AST --  ALT --  ALKPHOS --  BILITOT --  PROT --  ALBUMIN 3.4*   No results found for this basename: LIPASE:2,AMYLASE:2 in the last 72 hours  Basename 02/26/12 0710 02/25/12 0813  WBC 6.4 6.8  NEUTROABS -- --  HGB 11.7* 12.8  HCT 37.6 40.1  MCV 91.0 89.3  PLT 97* 93*   No results found for this basename: CKTOTAL:3,CKMB:3,CKMBINDEX:3,TROPONINI:3 in the last 72 hours No components found with this basename: POCBNP:3 No results found for this basename: DDIMER:2 in the last 72 hours No results found for this basename: HGBA1C:2 in the last 72 hours No results found for this basename: CHOL:2,HDL:2,LDLCALC:2,TRIG:2,CHOLHDL:2,LDLDIRECT:2 in the last 72 hours No results found for this basename: TSH,T4TOTAL,FREET3,T3FREE,THYROIDAB in the last 72 hours No results found for this basename: VITAMINB12:2,FOLATE:2,FERRITIN:2,TIBC:2,IRON:2,RETICCTPCT:2 in the last 72 hours  Micro Results: No results found for this  or any previous visit (from the past 240 hour(s)).  Studies/Results: Ir Ivc Filter Plmt / S&i /img Guid/mod Sed  02/25/2012  *RADIOLOGY REPORT*  Clinical data: Previous pulmonary emboli.  Chronic medical noncompliance including with her anticoagulation regimen.  INFERIOR VENACAVOGRAM IVC FILTER PLACEMENT UNDER FLUOROSCOPY  Technique and findings: The procedure, risks (including but not limited to bleeding, infection, organ damage), benefits, and alternatives were explained to the patient.  Questions regarding the procedure were encouraged and answered.  The patient understands and consents to the procedure. The right IJ vein appeared narrowed or occluded just above the tunneled IJ hemodialysis catheter, so a left-sided approach was selected. Patency of the   left IJ vein was confirmed with ultrasound with image documentation. An appropriate skin site was determined. Skin site was marked, prepped with chlorhexidine, and draped using maximum barrier technique. The region was infiltrated locally with 1% lidocaine.  Intravenous Fentanyl and Versed were administered as conscious sedation during continuous cardiorespiratory monitoring by the radiology RN, with a total moderate sedation time of six minutes.  Under real-time ultrasound guidance, the left IJ vein was accessed with a 21 gauge micropuncture needle; the needle tip within the vein was confirmed with ultrasound image documentation.   The needle was exchanged over a 018 guidewire for a transitional dilator, which allow  advancement of the [facility] wire into the IVC with the aid of a Kumpe catheter. A long 6 French vascular sheath was placed for inferior venacavography. This demonstrated no caval thrombus. Renal vein inflows were evident.  The TrapEase IVC filter was advanced through the sheath and successfully deployed under fluoroscopy at the L2-3 level. Followup cavagram demonstrates stable filter position  and no evident complication. The sheath was removed and  hemostasis achieved at the site. No immediate complication.  IMPRESSION: 1.  Normal IVC. No thrombus or significant anatomic variation. 2.  Technically successful infrarenal IVC filter placement.  Original Report Authenticated By: Osa Craver, M.D.    Medications: I have reviewed the patient's current medications.  Assessment and plan: Principal Problem:  *Pleuritic chest pain/ Massive PE (pulmonary embolism): - IVC filter placed 02/25/2012. -doppler showed, no DVT but there is some debris in the left common femoral vein could suggest recent clot. -INR sub therapeutic. Change heparin to lovenox -not a candidate for xarelto due to her ESRD. -NO IV PAIN MEDS.   ESRD (end stage renal disease) -Per renal. 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 -Toprolol every other day as tolerate it. -She is fluid restricted.  Thrombocytopenia -continue to monitor. -most likely 2/2 to clot burden. -Plt's decrease by 50%, stop heparin, start lovenox -Hit pending. Low probability.  Noncomplaince: -Psyq consulted, ? Capacity. -? palliative/hospice should get involved. -Very difficult situation, as the patient does not seem to have an insight on her multiple medical problems.  She takes our conversation to what it seems like very lightly and not seen the implications that her condition will have.    LOS: 6 days   Marinda Elk M.D. Pager: [phone] [facility] 02/26/2012, 9:15 AM

## 2012-02-26 NOTE — Consult Note (Signed)
Consult Note from the Palliative Medicine Team at [facility] Patient [name] Small Fritts      DOB: 26-Sep-1991      UJW:[id number]   Consult Requested by: Dr Radonna Ricker     PCP: Ellin Saba, Director Reason for Consultation:clarification of GOC and options     Phone Number:[phone]   This NP Lorinda Creed reviewed medical records, received report from team, assessed the patient and then meet at the patient's bedside along with her mother Eretria Manternach # [phone] and father Cash Meadow # [phone]  to discuss diagnosis prognosis, GOC, EOL wishes disposition and options.  A detailed discussion was had today with patient and parents regarding her overall poor prognosis and how her compliance has and will affect her  qaulity of life and disease progression.     Although the patient continue to verbalize approaches to her care and living situation that are not optimal from a medical approach, I believe she has capacity at this time.  She is able to reiterate information delivered, state risks and benefits,  and clearly states consequences of her choices.  She refuses to live at home under her parents stated "rules" and plans to return to live in [place] with her friend and his mother.  I ask to meet with them prior to discharge and she refuses.  From today's conversation, I deduce that this living environment is not optimal.  The patient in the past has had independent housing through a form of SSI but lost it to non compliance.  Her conversation reflects a desire to "be normal" and I believe she is trying her best to be compliant.  There are many developmental issues here.   Values and goals of care important to patient were attempted to be elicited.  She hopes for "antoher kidney" and to "go on a cruise for her 21st birthday"  We had a hard conversation to the fact that now she has cardiac involvement and a simple kidney transplant is unlikely.  Presently the plan is patient is open to any  offered medical intervention that will prolong life.  She verbalizes the importance of compliance, and she plans to return to previous living situation.  Both parents today do not believe this is in her best interest, but patient is making her own decisions. Patient is open to talking with Suzette Battiest Lett  LCSW with [facility].  I encouraged parents and other members of the family to seek counseling support in this very complex situation.     Brief HPI: complex medical history, Polycystic disease since age 27, s/p kidney transplant which failed then nephrectomy, HTN, non compliance, CHF, PE   ROS: verbalized no negative symptoms presently  "Im fine"    PMH:  Past Medical History  Diagnosis Date  . 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 Hemodialysis patient   . Hypertension   . Polycystic kidney disease   . Pulmonary emboli 01/2012    Bilateral, moderate clot burden, areas of pulmonary infarction and central necrosis  . CHF (congestive heart failure)   . Cardiomyopathy      PSH: Past Surgical History  Procedure Date  . Nephrectomy   . Av fistula placement   . Kidney transplant 2008    failed   I have reviewed the FH and SH and  If appropriate update it with new information. Allergies  Allergen Reactions  . Morphine And Related Itching  Scheduled Meds:   . acetaminophen  500 mg Oral TID  . calcium acetate  667 mg Oral TID WC  . enoxaparin (LOVENOX) injection  70 mg Subcutaneous Q24H  . HYDROmorphone  1 mg Oral Once  . multivitamin  1 tablet Oral QHS  . sodium chloride  3 mL Intravenous Q12H  . warfarin  12.5 mg Oral ONCE-1800  . warfarin  12.5 mg Oral ONCE-1800  . Warfarin - Pharmacist Dosing Inpatient   Does not apply q1800   Continuous Infusions:   . DISCONTD: heparin 850 Units/hr (02/25/12 2332)   PRN Meds:.acetaminophen, acetaminophen, albuterol, calcium carbonate  (dosed in mg elemental calcium), camphor-menthol, docusate sodium, feeding supplement (NEPRO CARB STEADY), heparin, HYDROmorphone, hydrOXYzine, ipratropium, ondansetron (ZOFRAN) IV, ondansetron, sorbitol, zolpidem, DISCONTD: HYDROmorphone, DISCONTD: HYDROmorphone    BP 129/70  Pulse 51  Temp(Src) 98.5 F (36.9 C) (Oral)  Resp 18  Ht 5\' 3"  (1.6 m)  Wt 69.1 kg (152 lb 5.4 oz)  BMI 26.99 kg/m2  SpO2 100%  LMP 01/26/2012   PPS:70%   Intake/Output Summary (Last 24 hours) at 02/26/12 1459 Last data filed at 02/26/12 0600  Gross per 24 hour  Intake    341 ml  Output      0 ml  Net    341 ml     Physical Exam:  General: NAD, well developed HEENT:  Moist membranes, no exudate Chest:   CTA CVS: RRR Abdomen:soft NT + BS Ext: without edema Neuro:alert and oriented X3 Psych: maintained good eye contact, engaged in conversation, I believe she has capacity  Labs: CBC    Component Value Date/Time   WBC 6.4 02/26/2012 0710   RBC 4.13 02/26/2012 0710   HGB 11.7* 02/26/2012 0710   HCT 37.6 02/26/2012 0710   PLT 97* 02/26/2012 0710   MCV 91.0 02/26/2012 0710   MCH 28.3 02/26/2012 0710   MCHC 31.1 02/26/2012 0710   RDW 20.0* 02/26/2012 0710   LYMPHSABS 3.4 02/20/2012 2320   MONOABS 0.6 02/20/2012 2320   EOSABS 0.1 02/20/2012 2320   BASOSABS 0.1 02/20/2012 2320    BMET    Component Value Date/Time   NA 137 02/24/2012 1233   K 4.2 02/24/2012 1233   CL 100 02/24/2012 1233   CO2 23 02/24/2012 1233   GLUCOSE 83 02/24/2012 1233   BUN 32* 02/24/2012 1233   CREATININE 8.79* 02/24/2012 1233   CALCIUM 10.5 02/24/2012 1233   GFRNONAA 6* 02/24/2012 1233   GFRAA 7* 02/24/2012 1233    CMP     Component Value Date/Time   NA 137 02/24/2012 1233   K 4.2 02/24/2012 1233   CL 100 02/24/2012 1233   CO2 23 02/24/2012 1233   GLUCOSE 83 02/24/2012 1233   BUN 32* 02/24/2012 1233   CREATININE 8.79* 02/24/2012 1233   CALCIUM 10.5 02/24/2012 1233   PROT 8.7* 02/20/2012 2320   ALBUMIN 3.4* 02/24/2012 1233   AST 35 02/20/2012 2320   ALT 17  02/20/2012 2320   ALKPHOS 71 02/20/2012 2320   BILITOT 0.4 02/20/2012 2320   GFRNONAA 6* 02/24/2012 1233   GFRAA 7* 02/24/2012 1233      Time In Time Out Total Time Spent with Patient Total Overall Time  1015 1215 120 120    Greater than 50%  of this time was spent counseling and coordinating care related to the above assessment and plan.   Lorinda Creed NP  [phone]

## 2012-02-26 NOTE — Progress Notes (Signed)
Utilization Review Completed.  [name] T  02/26/2012  

## 2012-02-26 NOTE — Progress Notes (Signed)
CSW met with pt briefly at pt beside to provide information regarding alf and family care homes. Pt was not interested and was not wanting to pursue option. Pt family was encouraging patient for family care home. CSW would like to speak further with psych csw regarding pt disposition. CSW will complete psychosocial assessment when able to assess patient further. Pt was visiting with family and eating lunch and requested csw to come back after thinking and speaking with family.  Catha Gosselin, Theresia Majors  [phone] .02/26/2012 1215pm

## 2012-02-26 NOTE — Progress Notes (Addendum)
ANTICOAGULATION CONSULT NOTE - Follow Up Consult  Pharmacy Consult for Heparin & Coumadin Day#4/5 overlap, Addum: Change heparin to lovenox Indication: pulmonary embolus- possible extension  Allergies  Allergen Reactions  . Morphine And Related Itching    Patient Measurements: Height: 5\' 3"  (160 cm) Weight: 152 lb 5.4 oz (69.1 kg) IBW/kg (Calculated) : 52.4   Vital Signs: Temp: 98.5 F (36.9 C) (05/06 0500) BP: 129/70 mmHg (05/06 0500) Pulse Rate: 51  (05/06 0500)  Labs:  Basename 02/26/12 0710 02/25/12 0813 02/24/12 1233 02/24/12 0530  HGB 11.7* 12.8 -- --  HCT 37.6 40.1 -- 37.7  PLT 97* 93* -- 106*  APTT -- -- -- --  LABPROT 18.8* 15.7* -- 16.0*  INR 1.54* 1.22 -- 1.25  HEPARINUNFRC 0.36 0.52 -- 0.32  CREATININE -- -- 8.79* --  CKTOTAL -- -- -- --  CKMB -- -- -- --  TROPONINI -- -- -- --   Estimated Creatinine Clearance: 9.5 ml/min (by C-G formula based on Cr of 8.79).  Assessment: 21 yo female with ESRD, HF EF 20% on Heparin & Coumadin for PE. Heparin level (0.36) is at goal on 850 units/hr.  No bleeding noted, h/h stable. INR (1.54) is below-goal, s/p IVC filter Noted that platelets of 97 for today (241 admit ). During previous admission in April 2013, platelets with similar drop and HIT-panel was negative (02/05/12).  Recheck HIT panel is pending.   BP 129/70 HR 51-92 - not on any BP meds at this time  Goal of Therapy:  INR 2-3 & heparin level 0.3-0.7   Plan:  1. Continue IV heparin to 850 units/hr.  2. Coumadin 12.5 mg po today.  3.daily HL, cbc, INR.   Xana Bradt Pharm.D. Clinical Pharmacist [phone] 02/26/2012 8:58 AM   Addum:  Change heparin to lovenox.  Will give 70 mg IV q24 hours.

## 2012-02-26 NOTE — Progress Notes (Signed)
Received two calls tonight from RN regarding patient with pain in right thigh, lower back and lower abdomen. Pain started when patient returned from IVC filter placement.   Patient hemodynamically stable.  Dopplers were done 5/4 and were negative for DVT (positive for sludge on the left).  Patient is anuric.  Pain meds have been increased to 1-2 mg dilaudid PO.  Have ordered abdominal ultrasound.   [name] Small, [place] [facility] Pager: [phone] 3:21 am 02/26/2012

## 2012-02-26 NOTE — Progress Notes (Signed)
Patient again in severe pain in rt thigh up to rt abdomen. Area palpated and soft to palpation and pulses positive in rt lower extremity. Pain seems to be positional. Patient states pain has been off and on since IVC filter procedure. She has had pain in that side before but not this severe. [place], PA notified with new orders entered will cont to monitor. Patient given ice pack to try and help discomfort.

## 2012-02-26 NOTE — Progress Notes (Signed)
Clinical Social Work Department CLINICAL SOCIAL WORK PSYCHIATRY SERVICE LINE ASSESSMENT 02/26/2012  Patient:  [name] Small  Account:  [id number]  Admit Date:  02/20/2012  Clinical Social Worker:  Unk Lightning, LCSW  Date/Time:  02/26/2012 04:30 PM Referred by:  Physician  Date referred:  02/26/2012 Reason for Referral  Behavioral Health Issues   Presenting Symptoms/Problems (In the person's/family's own words):   "I have been having a lot of medical problems and fighting with my parents"   Abuse/Neglect/Trauma History (check all that apply)  Denies history   Abuse/Neglect/Trauma Comments:   Psychiatric History (check all that apply)  Denies history   Psychiatric medications:  None reported   Current Mental Health Hospitalizations/Previous Mental Health History:   None   Current provider:   None   Place and Date:   None   Current Medications:   acetaminophen, acetaminophen, albuterol, calcium carbonate (dosed in mg elemental calcium), camphor-menthol, docusate sodium, feeding supplement (NEPRO CARB STEADY), heparin, HYDROmorphone, hydrOXYzine, ipratropium, ondansetron (ZOFRAN) IV, ondansetron, sorbitol, zolpidem, DISCONTD: HYDROmorphone, DISCONTD: HYDROmorphone                        . acetaminophen  500 mg Oral TID  . calcium acetate  667 mg Oral TID WC  . enoxaparin (LOVENOX) injection  70 mg Subcutaneous Q24H  . HYDROmorphone  1 mg Oral Once  . multivitamin  1 tablet Oral QHS  . sodium chloride  3 mL Intravenous Q12H  . warfarin  12.5 mg Oral ONCE-1800  . warfarin  12.5 mg Oral ONCE-1800  . Warfarin - Pharmacist Dosing Inpatient   Does not apply q1800   Previous Impatient Admission/Date/Reason:   None reported   Emotional Health / Current Symptoms    Suicide/Self Harm  None reported   Suicide attempt in the past:   Patient reports no previous attempts   Other harmful behavior:   Psychotic/Dissociative Symptoms  None reported   Other  Psychotic/Dissociative Symptoms:    Attention/Behavioral Symptoms  Within Normal Limits   Other Attention / Behavioral Symptoms:    Cognitive Impairment  Within Normal Limits   Other Cognitive Impairment:    Mood and Adjustment  Flat    Stress, Anxiety, Trauma, Any Recent Loss/Stressor  None reported   Anxiety (frequency):   Phobia (specify):   Compulsive behavior (specify):   Obsessive behavior (specify):   Other:   Substance Abuse/Use  None   SBIRT completed (please refer for detailed history):  NA  Self-reported substance use:   Patient reports no SA   Urinary Drug Screen Completed:  N Alcohol level:   None    Environmental/Housing/Living Arrangement  Stable housing   Who is in the home:   Friend and friend's mom   Emergency contact:  Connie-mom   Financial  Medicare  Medicaid   Patient's Strengths and Goals (patient's own words):   "I have been through all this and survived"   Clinical Social Worker's Interpretive Summary:   CSW received referral due to patient refusing treatment and arguing with parents. CSW met with patient at bedside with no visitors present. CSW offered patient time to talk about her needs and why she felt she was in the hospital. [name] and reports that she has been sick for a long time. [name]. Patient reports she has been compliant with medication  and treatment and promises to follow up with appointments after discharge.    Patient spoke about her relationship with her parents and how she has always been responsible for getting to appointments. Patient spoke about getting dialysis 3 times a week and taking TAMS to get to appointments. When asked about her feelings towards attending appointments alone, patient reported she preferred to attend alone because her parents work during the  days and her friends report back to her parents. Per patient, she wants to be independent and live on her own. Patient reports her parents are having a hard time with allowing her space. Patient was able to recognize that her parents were going through a transition just as she was with moving out of the house.  [name]. Patient reports this is not the best situation but reports it is better than living at home. Patient reports that she plans to return there at discharge.    Patient was flat during assessment. Patient avoided eye contact and answered with short answers. Patient identified her support systems but later contradicted those supports by stating they "got on her nerves" when she tried to talk with them.    Patient reports no depression, SI or HI. Patient reports no hallucinations or delusions. Patient stated she was dealing with all of her medical needs but at this point is willing to follow through with treatment. Patient reports no concern for outpatient therapy or medication and reports she wishes to continue on with her routine.   Disposition:  Recommend Psych CSW continuing to support while in hospital

## 2012-02-26 NOTE — Progress Notes (Signed)
Patient complaining of rt thigh pain that radiates up into lower rt abd and rt side. Received pain med at 2337 with some initial relief but pain back and severe per patient. [place], PA notified and orders received. Will medicate and cont to monitor.

## 2012-02-27 ENCOUNTER — Inpatient Hospital Stay (HOSPITAL_COMMUNITY): Payer: Managed Care, Other (non HMO)

## 2012-02-27 DIAGNOSIS — Q613 Polycystic kidney, unspecified: Secondary | ICD-10-CM

## 2012-02-27 DIAGNOSIS — N186 End stage renal disease: Secondary | ICD-10-CM

## 2012-02-27 DIAGNOSIS — I959 Hypotension, unspecified: Secondary | ICD-10-CM

## 2012-02-27 DIAGNOSIS — I10 Essential (primary) hypertension: Secondary | ICD-10-CM

## 2012-02-27 LAB — CBC
HCT: 36 % (ref 36.0–46.0)
HCT: 34.3 % — ABNORMAL LOW (ref 36.0–46.0)
Hemoglobin: 11.4 g/dL — ABNORMAL LOW (ref 12.0–15.0)
Hemoglobin: 10.9 g/dL — ABNORMAL LOW (ref 12.0–15.0)
MCHC: 31.7 g/dL (ref 30.0–36.0)
MCHC: 31.8 g/dL (ref 30.0–36.0)
RBC: 4.03 MIL/uL (ref 3.87–5.11)
WBC: 5.4 10*3/uL (ref 4.0–10.5)
WBC: 5.7 10*3/uL (ref 4.0–10.5)

## 2012-02-27 LAB — PROTIME-INR
INR: 2.05 — ABNORMAL HIGH (ref 0.00–1.49)
Prothrombin Time: 23.5 seconds — ABNORMAL HIGH (ref 11.6–15.2)

## 2012-02-27 LAB — RENAL FUNCTION PANEL
Albumin: 3.2 g/dL — ABNORMAL LOW (ref 3.5–5.2)
BUN: 41 mg/dL — ABNORMAL HIGH (ref 6–23)
Chloride: 98 mEq/L (ref 96–112)
GFR calc Af Amer: 6 mL/min — ABNORMAL LOW (ref >90)
Glucose, Bld: 77 mg/dL (ref 70–99)
Phosphorus: 5.8 mg/dL — ABNORMAL HIGH (ref 2.3–4.6)
Potassium: 4.2 mEq/L (ref 3.5–5.1)
Sodium: 133 mEq/L — ABNORMAL LOW (ref 135–145)

## 2012-02-27 MED ORDER — WARFARIN SODIUM 7.5 MG PO TABS
7.5000 mg | ORAL_TABLET | Freq: Once | ORAL | Status: DC
  Filled 2012-02-27: qty 1

## 2012-02-27 MED ORDER — WARFARIN SODIUM 5 MG PO TABS: 5.0000 mg | ORAL_TABLET | Freq: Every day | ORAL | Status: DC

## 2012-02-27 NOTE — Progress Notes (Signed)
Pt was provided with d/c instructions. Pt states that she plans to follow up at dr. appointments and promises to take coumadin every day. Pt was educated on the importance of taking all medications as scheduled and making all appointment. Pt verbalized understanding. Pt states she is ready for d/c. VSS at this time. No complaints at this time. Pt IV removed with tip intact. Heart monitor returned to front. Pt is waiting for sister to take her home. Will continue to monitor until patient leaves. Ramond Craver, Rn

## 2012-02-27 NOTE — Progress Notes (Signed)
Palliative Medicine Team SW  Pt referred for psychosocial support, saw pt as she was waiting for d/c. Pt cooperative, but soft-spoken, affect appears younger than age. Pt reports plan to d/c to friend's mother's home "just temporarily" until a hearing regarding her disability in June, after which she is hopeful to get an apartment of her own. Pt discussed her issues with compliance; she is able to outline what her medical requirements are.  We discussed potential activities for her to do during dialysis such as knitting/crochet as she used to do that with her grandmother. When asked about her feelings, she states she is mostly "angry about everything". Pt reports low peer support, but plans to see social worker at dialysis center for ongoing counseling. I asked pt whether she feels like she needs to "tell me what I want hear", but she states she likes the social worker at dialysis and no longer has friends to discuss her feelings with because "they think I'm non-compliant too". Pt states she wants to be able to drive herself to appointments, but that her parents took her car away and informed her grandparents not to allow her to use it. Attempted to take a handsoff, but candid approach with pt as she seems to want to be taken seriously as adult. Discussed with pt both her control over and consequences of her choices moving forward and the way in which trust in her abilities is only reinforced by demonstrations of responsibility with these matters. Pt voiced her understanding and her hope that things "will be better". I asked her if there were any other pressing issues she wanted to process with me and after a long pause she said no. Provided my contact information for pt. No further needs at this time.   Kennieth Francois, [place] Pager [phone]

## 2012-02-27 NOTE — BH Assessment (Signed)
Patient Identification:  [name] Small Date of Evaluation:  02/27/2012   History of Present Illness:Pt is admitted for polycystic disease.  She says her non-adherence was due to transportation issues.  She is admitted for a pulmonary embolism and had associated symptoms.   Past Psychiatric History: Kidney transplant, rejected and removed.    Past Medical History:     Past Medical History  Diagnosis Date  . 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 Hemodialysis patient   . Hypertension   . Polycystic kidney disease   . Pulmonary emboli 01/2012    Bilateral, moderate clot burden, areas of pulmonary infarction and central necrosis  . CHF (congestive heart failure)   . Cardiomyopathy        Past Surgical History  Procedure Date  . Nephrectomy   . Av fistula placement   . Kidney transplant 2008    failed    Allergies:  Allergies  Allergen Reactions  . Morphine And Related Itching    Current Medications:  Prior to Admission medications   Medication Sig Start Date End Date Taking? Authorizing Provider  calcium acetate (PHOSLO) 667 MG capsule Take 667 mg by mouth 3 (three) times daily with meals.     Yes Historical Provider, MD  carvedilol (COREG) 6.25 MG tablet Take 1 tablet (6.25 mg total) by mouth 2 (two) times daily with a meal. 02/14/12 02/13/13 Yes Laveda Norman, MD  enalapril (VASOTEC) 10 MG tablet Take 10 mg by mouth daily.   Yes Historical Provider, MD  hydrALAZINE (APRESOLINE) 25 MG tablet Take 25 mg by mouth 2 (two) times daily with a meal.   Yes Historical Provider, MD  isosorbide dinitrate (ISORDIL) 20 MG tablet Take 20 mg by mouth 3 (three) times daily.   Yes Historical Provider, MD  multivitamin (RENA-VIT) TABS tablet Take 1 tablet by mouth at bedtime. 02/14/12  Yes Laveda Norman, MD  oxyCODONE-acetaminophen (PERCOCET) 5-325 MG per tablet Take 1-2 tablets by mouth every 4  (four) hours as needed. For pain    Yes Historical Provider, MD  promethazine (PHENERGAN) 25 MG tablet Take 25 mg by mouth every 6 (six) hours as needed. For nausea and vomiting    Yes Historical Provider, MD  warfarin (COUMADIN) 5 MG tablet Take 1 tablet (5 mg total) by mouth daily. 02/14/12 02/13/13 Yes Laveda Norman, MD    Social History:    reports that she has quit smoking. Her smoking use included Cigarettes. She smoked .2 packs per day. She does not have any smokeless tobacco history on file. She reports that she does not drink alcohol or use illicit drugs.   Family History:    Family History  Problem Relation Age of Onset  . Polycystic kidney disease Father     Mental Status Examination/Evaluation: Objective:  Appearance: Casual  Psychomotor Activity:  cal   Eye Contact::  Good  Speech:  Normal Rate  Volume:  Normal  Mood:  Dysphoric  Affect:  Congruent  Thought Process:  Coherent  Orientation:  Full  Thought Content:  Suicidal ideation  Suicidal Thoughts:  No  Homicidal Thoughts:  No  Judgement:  Fair  Insight:  Lacking    DIAGNOSIS:   AXIS I   Depression   AXIS II  Deferred  AXIS III See medical notes.  AXIS IV housing problems, other psychosocial or environmental problems, problems related to social environment and problems with  primary support groupfamily, environmental issues housing, transportation2  AXIS V 51-60 moderate symptoms     Assessment/Plan:  Pt  Is interviewed 02/27/12 Pt who is said to be non-adherent to medical regimen, is voicing willingness to cooperate with treament.  She has capacity and is oriented by her report she is lacking access to community services.  RECOMMENDATION 1. Continue medical treatment 2. Consider referral to [facility] or comparable inpatient psychiatric unit.  3. No further psychiatric needs unless requested.  MD Psychiatrist signs off

## 2012-02-27 NOTE — Care Management Note (Signed)
    Page 1 of 1   02/27/2012     10:40:02 AM   CARE MANAGEMENT NOTE 02/27/2012  Patient:  [name] Small,[name] Small   Account Number:  [id number]  Date Initiated:  02/26/2012  Documentation initiated by:  Junius Creamer  Subjective/Objective Assessment:   adm w pleuritic ch pain, esrd, ef 20%     Action/Plan:   lives w fam. pcp dr Tora Duck booth   Anticipated DC Date:  02/28/2012   Anticipated DC Plan:  ASSISTED LIVING / REST HOME  In-house referral  Clinical Social Worker  Hospice / Palliative Care      DC Planning Services  CM consult  Follow-up appt scheduled      Choice offered to / List presented to:             Status of service:  Completed, signed off Medicare Important Message given?   (If response is "NO", the following Medicare IM given date fields will be blank) Date Medicare IM given:   Date Additional Medicare IM given:    Discharge Disposition:  HOME/SELF CARE  Per UR Regulation:    If discussed at Long Length of Stay Meetings, dates discussed:    Comments:  5/6 13:00 debbie Tanaja Ganger rn,bsn  5/7 10:38a debbie Malvika Tung rn,bsn appt sched for sherry booth np at [facility] for thurs 5-9 at 10:45a for post hosp follow up and pt/inr labs.

## 2012-02-27 NOTE — Progress Notes (Signed)
ANTICOAGULATION CONSULT NOTE - Follow Up Consult  Pharmacy Consult for Lovenox & Coumadin Day#5/5 overlap Indication: pulmonary embolus- possible extension  Allergies  Allergen Reactions  . Morphine And Related Itching    Patient Measurements: Height: 5\' 3"  (160 cm) Weight: 155 lb 6.8 oz (70.5 kg) IBW/kg (Calculated) : 52.4   Vital Signs: Temp: 97.4 F (36.3 C) (05/07 0655) Temp src: Oral (05/07 0655) BP: 103/52 mmHg (05/07 0900) Pulse Rate: 91  (05/07 0900)  Labs:  Alvira Philips 02/27/12 0758 02/27/12 0645 02/26/12 0710 02/25/12 0813 02/24/12 1233  HGB 10.9* 11.4* -- -- --  HCT 34.3* 36.0 37.6 -- --  PLT 110* 119* 97* -- --  APTT -- -- -- -- --  LABPROT -- 23.5* 18.8* 15.7* --  INR -- 2.05* 1.54* 1.22 --  HEPARINUNFRC -- -- 0.36 0.52 --  CREATININE 9.23* -- -- -- 8.79*  CKTOTAL -- -- -- -- --  CKMB -- -- -- -- --  TROPONINI -- -- -- -- --   Estimated Creatinine Clearance: 9.1 ml/min (by C-G formula based on Cr of 9.23).  Assessment: 21 yo female with ESRD, HF EF 20% on Lovenox & Coumadin for PE. No bleeding noted, h/h stable. INR (2.05) is at goal, protime increased by ~ 5 sec.S/P  IVC filter Noted that platelets of 110 for today (241 admit ). During previous admission in April 2013, platelets with similar drop and HIT-panel was negative (02/05/12).  Recheck HIT panel is pending.   BP 103/52 HR 72-91 - not on any BP meds at this time  Goal of Therapy:  INR 2-3    Plan:  1. Continue lovenox, consider dc tomorrow. 2. Coumadin 7.5 mg po today.  3.daily PT/INR  Teren Franckowiak Pharm.D. Clinical Pharmacist [phone] 02/27/2012 9:36 AM

## 2012-02-27 NOTE — Discharge Summary (Signed)
Physician Discharge Summary  [name] Small ZOX:[id number] DOB: 11-27-1990 DOA: 02/20/2012  PCP: Ellin Saba, Director  Admit date: 02/20/2012 Discharge date: 02/27/2012  Discharge Diagnoses: Massive PE End stage renal disease Chronic systolic nonischemic cardiomyopathy with an EF of 20% Thrombocytopenia Noncompliance  Discharge Condition: Patient was discharged in stable condition.  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 cavitary lesion due to necrosis in 01/2012, on coumadin, now transferred  from [facility] where she presented with pleuritic chest pain and dyspnea with INR  1.5.  Pt was last admitted to [facility] 4/13 with chest pain and hemoptysis. Found on admission to have  V/Q scan with high probability for PE, bilaterally and pulmonary infarct. She was on heparin  bridge to coumadin. Echo showed EF 20%, seen by Dr. Sharyn Lull, started coreg, continue  lisinopril, plan repeat echo in a few months. Seen by vascular surgery for evaluation of access, and  was dialyzed regularly by renal. Found on CT to have cavitary rounded area in RLL up to 16mm,  seen by pulmonary. Discharged home.  Pt was admitted to [facility] on 4/29 with chest pain worse with inspiration, noted to  have mild hypoxemia. She was started on Heparin drip and started higher doses of coumadin,  INR was 1.9 on transfer to [facility]. Pt endorses mid sternal chest pressure, no radiation, no associated  symptoms, and also sharp pain that is worse when she breathes that is located in her high upper  right back. She reports some dyspnea on the night she was admitted to [facility] but seems better  now.  On arrival, pt's BP noted to be 84/63 which she states her baseline is usually HTN, and after  asked  she does state some dizziness with positional change. ROS as above, otherwise with some  diarrhea, some RLQ pain where she states she had an abscess drained the week before her [facility]  admission at [facility]. Otherwise, negative all other systems. Endorses coumadin compliance.    Hospital Course:  -Pleuritic chest pain and PE: She was admitted to the hospital started on heparin and Coumadin. Doppler of the lower extremities was done that showed results above and debris from a clot. It was discussed with the patient due to noncompliance she would benefit from a IVC filter she agreed with the risk and benefits. This was placed. She was continued on heparin and Coumadin her INR became therapeutic and she'll followup with her primary care Dr. as an outpatient. She has an appointment for Coumadin clinic as below.   -End-stage renal disease: As per renal she was continue on dialysis. It was hard to dialyze as her blood pressure keep you up to dropping with dialysis. Her heart failure medication had to be stopped and was continued with dialysis. She will followup with the dialysis center as an outpatient.  -Chronic systolic heart failure: She was admitted to the hospital. Her blood pressure medication and her family medication had to be stopped. The heart failure team was consulted they relate that her options were limited, Unfortunately medical therapy limited due to hypotension.   As she did not tolerate her blood pressure medication. She was started on a beta blocker every other day and she still did not tolerated this as she became bradycardic. They recommended to followup as  an outpatient. She will not go home on Mondays and as her blood pressure has been significantly low and even worse with dialysis. She was not seen in a beta blocker because of her significant bradycardia she out he had in the hospital with a beta blocker. The heart failure team discuss with her family the unfortunate situation and they  related there was not much I can offer. Psych was consulted they thought she had no psychiatric component to her noncompliance.  Ethics: A detailed discussion was had today with patient and parents regarding her overall poor prognosis and how her compliance has and will affect her qaulity of life and disease progression Although the patient continue to verbalize approaches to her care and living situation that are not optimal from a medical approach, I believe she has capacity at this time. She is able to reiterate information delivered, state risks and benefits, and clearly states consequences of her choices. Very difficult home situation as she refuses to live at home under her parents stated "rules" and plans to return to live in [place] with her friend and his mother.   Discharge Exam: Filed Vitals:   02/27/12 1124  BP: 93/45  Pulse: 90  Temp: 96.7 F (35.9 C)  Resp: 18   Filed Vitals:   02/27/12 1103 02/27/12 1106 02/27/12 1112 02/27/12 1124  BP: 82/38 86/44 90/41  93/45  Pulse: 95 94 92 90  Temp:    96.7 F (35.9 C)  TempSrc:    Oral  Resp:    18  Height:      Weight:    69.9 kg (154 lb 1.6 oz)  SpO2:    97%   General: Alert, awake, oriented x3, in no acute distress.  HEENT: No bruits, no goiter.  Heart: Regular rate and rhythm, without murmurs, rubs, gallops.  Lungs: Good air movement + JVD  Abdomen: Soft, nontender, nondistended, positive bowel sounds.  Neuro: Grossly intact, nonfocal.   Discharge Instructions  Discharge Orders    Future Appointments: Provider: Department: Dept Phone: Center:   03/06/2012 11:45 AM [facility] [phone] None   03/11/2012 3:30 PM [facility] [phone] [facility]   03/11/2012 4:00 PM Nada Libman, MD [facility] [phone] [facility]   03/13/2012 2:00 PM Kalman Shan, MD [facility] [phone] None     Future Orders Please Complete By Expires   Diet - low sodium heart healthy       Increase activity slowly        Medication List  As of 02/27/2012 11:35 AM   STOP taking these medications         carvedilol 6.25 MG tablet      enalapril 10 MG tablet      hydrALAZINE 25 MG tablet      isosorbide dinitrate 20 MG tablet         TAKE these medications         calcium acetate 667 MG capsule   Commonly known as: PHOSLO   Take 667 mg by mouth 3 (three) times daily with meals.      multivitamin Tabs tablet   Take 1 tablet by mouth at bedtime.      oxyCODONE-acetaminophen 5-325 MG per tablet   Commonly known as: PERCOCET   Take 1-2 tablets by mouth every 4 (four) hours as needed. For pain      promethazine 25 MG tablet   Commonly known as: PHENERGAN   Take 25 mg by mouth every 6 (  six) hours as needed. For nausea and vomiting        warfarin 5 MG tablet   Commonly known as: COUMADIN   Take 1 tablet (5 mg total) by mouth daily.           Follow-up Information    Follow up with Ellin Saba on 02/29/2012. ([facility] 10:45a)    Contact information:   - - - - [phone]       Follow up with Arvilla Meres, MD in 1 week. [facility] follow up on wenesday  may 15th 11:45 am)    Contact information:   [address] [place] [address] [phone]           The results of significant diagnostics from this hospitalization (including imaging, microbiology, ancillary and laboratory) are listed below for reference.    Significant Diagnostic Studies: Ct Chest Wo Contrast  02/07/2012  *RADIOLOGY REPORT*  Clinical Data: Follow-up cavitation.  Pulmonary emboli.  CT CHEST WITHOUT CONTRAST  Technique:  Multidetector CT imaging of the chest was performed following the standard protocol without IV contrast.  Comparison: 02/03/2012  Findings: The ground-glass air consolidation in the lingula is less pronounced than prior study.  New similarly appearing ground-glass areas of consolidation are now present  in the right upper lobe. This likely reflects pulmonary infarct in the area supplied by the previously seen right upper lobe artery contained pulmonary embolus.  Small right pleural effusion, enlarging since prior study. Previously seen cavitary area within the right lower lobe is unchanged.  This area measures approximately 16 mm.  Mild surrounding ground-glass density, stable.  There is cardiomegaly.  Right dialysis catheter remains in place, unchanged. No mediastinal, hilar, or axillary adenopathy.  No left pleural effusion. Visualized thyroid and chest wall soft tissues unremarkable.  Imaging into the upper abdomen demonstrates calcifications as well as numerous mixed density cysts within the kidneys bilaterally compatible with end-stage renal disease.  No acute findings in the upper abdomen.  IMPRESSION: Lingular ground-glass airspace disease slightly decreased since prior study.  New right upper lobe ground-glass airspace disease, presumably pulmonary infarcts in the area of the previously seen pulmonary embolus.  Slight enlargement of the right pleural effusion which remains small.  Cavitary rounded area within the right lower lobe measures up to 16 mm.  Surrounding ground-glass opacities.  Findings are stable. Differential considerations would include infectious process versus a cavitary lesion/malignancy.  Recommend continued follow up.  Original Report Authenticated By: Cyndie Chime, M.D.   Ct Angio Chest W/cm &/or Wo Cm  02/03/2012  *RADIOLOGY REPORT*  Clinical Data:  Hemoptysis, VQ scan high probability for PE. Abdominal pain, prior abscess, failed renal transplant.  CT ANGIOGRAPHY CHEST CT ABDOMEN AND PELVIS WITH CONTRAST  Technique:  Multidetector CT imaging of the chest was performed using the standard protocol during bolus administration of intravenous contrast.  Multiplanar CT image reconstructions including MIPs were obtained to evaluate the vascular anatomy. Multidetector CT imaging of the  abdomen and pelvis was performed using the standard protocol during bolus administration of intravenous contrast.  Contrast: 65mL OMNIPAQUE IOHEXOL 350 MG/ML SOLN  Comparison:  VQ scan dated 02/02/2012.  CT abdomen pelvis dated 04/17/2011.  CTA CHEST  Findings:  Segmental pulmonary embolus within a lingular branch of the left lower lobe pulmonary artery (series 6/image 102). Additional subsegmental pulmonary emboli within a branch of the right upper lobe pulmonary artery (series 6/image 85), right middle lobe pulmonary artery (series 6/image  117) and right lower lobe pulmonary artery (series 6/image 135).  Overall clot burden is moderate. No associated findings to suggest right heart strain.  Patchy opacities in the lingula, compatible with pulmonary infarct. Additional patchy opacity with central lucency in the medial right lung base (series 7/image 87), possibly reflecting pulmonary infarct, although infection with central necrosis is not excluded. Superimposed mild diffuse ground glass opacity, likely reflecting interstitial edema.  No pleural effusion or pneumothorax.  Visualized thyroid is unremarkable.  Cardiomegaly.  No pericardial effusion.  No suspicious mediastinal, hilar, or axillary lymphadenopathy.  Right chest dialysis catheter.  Visualized osseous structures are within normal limits.   Review of the MIP images confirms the above findings.  IMPRESSION: Segmental pulmonary embolus within a lingular branch of the left lower lobe pulmonary artery.  Additional subsegmental pulmonary emboli in the right upper, middle, and lower lobes.  Overall clot burden is moderate.  Suspected pulmonary infarct in the lingula.  Pulmonary infarct versus infection with central necrosis in the medial right lung base.  Cardiomegaly with superimposed mild interstitial edema.  Critical Value/emergent results were called by telephone at the time of interpretation on 02/03/2012  at 1740 hours  to  Dr. Hartley Barefoot, who  verbally acknowledged these results.  CT ABDOMEN AND PELVIS  Findings: Heterogeneous perfusion of the liver with mild periportal edema.  Spleen, pancreas, and adrenal glands are within normal limits.  Vicarious excretion of contrast in the gallbladder with gallbladder wall thickening/edema in the fundus (series 5/image 35).  Native renal cortical atrophy with numerous bilateral cysts of varying sizes and complexity. Scattered areas of high density including along the upper pole of the left kidney (series 5/image 18) are favored to reflect interval development of calcifications rather than enhancing lesions.  No hydronephrosis.  No evidence of bowel obstruction.  Normal appendix.  No colonic wall thickening or inflammatory changes.  No evidence of abdominal aortic aneurysm.  Small to moderate abdominopelvic ascites.  Mild retroperitoneal/para-aortic lymph nodes measuring up to 10 mm short axis.  Postsurgical changes in the right lower quadrant from prior/failed renal transplant.  Uterus and bilateral ovaries are unremarkable.  Bladder is underdistended.  No evidence of retroperitoneal hemorrhage.  No evidence of intra- abdominal abscess.  Visualized osseous structures are within normal limits.  Review of the MIP images confirms the above findings.  IMPRESSION: Normal appendix.  No evidence of bowel obstruction.  No colonic wall thickening or inflammatory changes.  Gallbladder wall thickening/edema with mild periportal edema, likely reflecting hypoalbuminemia/overall volume state.  Associated small to moderate abdominopelvic ascites.  If there is clinical concern for acute cholecystitis, RUQ ultrasound can be performed for further evaluation.  Native renal cortical atrophy with numerous bilateral cysts of varying sizes and complexity and suspected calcifications. Postsurgical changes related to prior/failed right lower quadrant renal transplant.  No CT findings to account for the patient's abdominal pain.  Original  Report Authenticated By: Charline Bills, M.D.   US Abdomen Complete  02/26/2012  *RADIOLOGY REPORT*  Clinical Data:  Abdominal pain  ULTRASOUND ABDOMEN:  Technique:  Sonography of upper abdominal structures was performed.  Comparison:  None Correlation:  CT abdomen/pelvis 02/03/2012  Gallbladder:  Normally distended without stones or wall thickening. No pericholecystic fluid or sonographic Murphy sign.  Common bile duct:  Normal caliber 3 mm diameter  Liver:  Normal appearance  IVC:  Normal appearance  Pancreas:  Normal appearance  Spleen: Normal appearance, 7.7 cm length  Right kidney:  8.6 cm length.  Cortical thinning and  increased cortical echogenicity.  Fairly nodular appearance of the right kidney with multiple cysts up to 1.7 cm diameter.  No gross dominant mass or hydronephrosis demonstrated sonographically.  Left kidney: 9.9 cm length. Marked cortical thinning and increased cortical echogenicity.  Numerous cystic lesions are seen throughout the left kidney, measuring up to 3.1 cm in greatest size.  No gross dominant solid mass or hydronephrosis demonstrated sonographically.  Aorta:  Normal caliber  Other:  No free fluid  IMPRESSION: Bilateral abnormal appearance of the kidneys demonstrating cortical thinning and increased cortical echogenicity suggesting medical renal disease. Numerous cystic foci are seen within both kidneys, including simple and complicated cysts; no definite dominant renal mass identified. These areas were better visualized on the preceding CT of 02/03/2012. Remainder of exam unremarkable.  Original Report Authenticated By: Lollie Marrow, M.D.   Ct Abdomen Pelvis W Contrast  02/03/2012  *RADIOLOGY REPORT*  Clinical Data:  Hemoptysis, VQ scan high probability for PE. Abdominal pain, prior abscess, failed renal transplant.  CT ANGIOGRAPHY CHEST CT ABDOMEN AND PELVIS WITH CONTRAST  Technique:  Multidetector CT imaging of the chest was performed using the standard protocol during bolus  administration of intravenous contrast.  Multiplanar CT image reconstructions including MIPs were obtained to evaluate the vascular anatomy. Multidetector CT imaging of the abdomen and pelvis was performed using the standard protocol during bolus administration of intravenous contrast.  Contrast: 65mL OMNIPAQUE IOHEXOL 350 MG/ML SOLN  Comparison:  VQ scan dated 02/02/2012.  CT abdomen pelvis dated 04/17/2011.  CTA CHEST  Findings:  Segmental pulmonary embolus within a lingular branch of the left lower lobe pulmonary artery (series 6/image 102). Additional subsegmental pulmonary emboli within a branch of the right upper lobe pulmonary artery (series 6/image 85), right middle lobe pulmonary artery (series 6/image 117) and right lower lobe pulmonary artery (series 6/image 135).  Overall clot burden is moderate. No associated findings to suggest right heart strain.  Patchy opacities in the lingula, compatible with pulmonary infarct. Additional patchy opacity with central lucency in the medial right lung base (series 7/image 87), possibly reflecting pulmonary infarct, although infection with central necrosis is not excluded. Superimposed mild diffuse ground glass opacity, likely reflecting interstitial edema.  No pleural effusion or pneumothorax.  Visualized thyroid is unremarkable.  Cardiomegaly.  No pericardial effusion.  No suspicious mediastinal, hilar, or axillary lymphadenopathy.  Right chest dialysis catheter.  Visualized osseous structures are within normal limits.   Review of the MIP images confirms the above findings.  IMPRESSION: Segmental pulmonary embolus within a lingular branch of the left lower lobe pulmonary artery.  Additional subsegmental pulmonary emboli in the right upper, middle, and lower lobes.  Overall clot burden is moderate.  Suspected pulmonary infarct in the lingula.  Pulmonary infarct versus infection with central necrosis in the medial right lung base.  Cardiomegaly with superimposed mild  interstitial edema.  Critical Value/emergent results were called by telephone at the time of interpretation on 02/03/2012  at 1740 hours  to  Dr. Hartley Barefoot, who verbally acknowledged these results.  CT ABDOMEN AND PELVIS  Findings: Heterogeneous perfusion of the liver with mild periportal edema.  Spleen, pancreas, and adrenal glands are within normal limits.  Vicarious excretion of contrast in the gallbladder with gallbladder wall thickening/edema in the fundus (series 5/image 35).  Native renal cortical atrophy with numerous bilateral cysts of varying sizes and complexity. Scattered areas of high density including along the upper pole of the left kidney (series 5/image 18) are favored to reflect interval  development of calcifications rather than enhancing lesions.  No hydronephrosis.  No evidence of bowel obstruction.  Normal appendix.  No colonic wall thickening or inflammatory changes.  No evidence of abdominal aortic aneurysm.  Small to moderate abdominopelvic ascites.  Mild retroperitoneal/para-aortic lymph nodes measuring up to 10 mm short axis.  Postsurgical changes in the right lower quadrant from prior/failed renal transplant.  Uterus and bilateral ovaries are unremarkable.  Bladder is underdistended.  No evidence of retroperitoneal hemorrhage.  No evidence of intra- abdominal abscess.  Visualized osseous structures are within normal limits.  Review of the MIP images confirms the above findings.  IMPRESSION: Normal appendix.  No evidence of bowel obstruction.  No colonic wall thickening or inflammatory changes.  Gallbladder wall thickening/edema with mild periportal edema, likely reflecting hypoalbuminemia/overall volume state.  Associated small to moderate abdominopelvic ascites.  If there is clinical concern for acute cholecystitis, RUQ ultrasound can be performed for further evaluation.  Native renal cortical atrophy with numerous bilateral cysts of varying sizes and complexity and suspected  calcifications. Postsurgical changes related to prior/failed right lower quadrant renal transplant.  No CT findings to account for the patient's abdominal pain.  Original Report Authenticated By: Charline Bills, M.D.   Ir Ivc Filter Plmt / S&i /img Guid/mod Sed  02/25/2012  *RADIOLOGY REPORT*  Clinical data: Previous pulmonary emboli.  Chronic medical noncompliance including with her anticoagulation regimen.  INFERIOR VENACAVOGRAM IVC FILTER PLACEMENT UNDER FLUOROSCOPY  Technique and findings: The procedure, risks (including but not limited to bleeding, infection, organ damage), benefits, and alternatives were explained to the patient.  Questions regarding the procedure were encouraged and answered.  The patient understands and consents to the procedure. The right IJ vein appeared narrowed or occluded just above the tunneled IJ hemodialysis catheter, so a left-sided approach was selected. Patency of the   left IJ vein was confirmed with ultrasound with image documentation. An appropriate skin site was determined. Skin site was marked, prepped with chlorhexidine, and draped using maximum barrier technique. The region was infiltrated locally with 1% lidocaine.  Intravenous Fentanyl and Versed were administered as conscious sedation during continuous cardiorespiratory monitoring by the radiology RN, with a total moderate sedation time of six minutes.  Under real-time ultrasound guidance, the left IJ vein was accessed with a 21 gauge micropuncture needle; the needle tip within the vein was confirmed with ultrasound image documentation.   The needle was exchanged over a 018 guidewire for a transitional dilator, which allow advancement of the [facility] wire into the IVC with the aid of a Kumpe catheter. A long 6 French vascular sheath was placed for inferior venacavography. This demonstrated no caval thrombus. Renal vein inflows were evident.  The TrapEase IVC filter was advanced through the sheath and successfully  deployed under fluoroscopy at the L2-3 level. Followup cavagram demonstrates stable filter position  and no evident complication. The sheath was removed and hemostasis achieved at the site. No immediate complication.  IMPRESSION: 1.  Normal IVC. No thrombus or significant anatomic variation. 2.  Technically successful infrarenal IVC filter placement.  Original Report Authenticated By: Osa Craver, M.D.   Nm Pulmonary Per & Vent  02/02/2012  *RADIOLOGY REPORT*  Clinical Data: Shortness of breath, cough, hemoptysis  NM PULMONARY VENTILATION AND PERFUSION SCAN  Radiopharmaceutical: CURIE xenon xe 133 gas 20 milli Curie XENON XE 133 GAS, CURIE MAA TECHNETIUM TO 70M ALBUMIN AGGREGATED  Comparison: Correlation with chest radiograph dated 02/02/2012  Findings: Ventilation images demonstrate decreased ventilation to  the left lower lobe.  Perfusion images demonstrate a segmental filling defect to the anterior portion of the left lower lobe.  Additional possible subsegmental filling defect to the anterior right lower lobe.  Chest radiograph demonstrates mild cardiomegaly but no corresponding pulmonary opacities.  This appearance corresponds to a high probability for pulmonary embolism.  IMPRESSION: High probability for pulmonary embolism.  Critical Value/emergent results were called by telephone at the time of interpretation on 02/02/2012  at 2345 hours  to  Dr. Effie Shy, who verbally acknowledged these results.  Original Report Authenticated By: Charline Bills, M.D.   Dg Chest Port 1 View  02/20/2012  *RADIOLOGY REPORT*  Clinical Data: Chest pain  PORTABLE CHEST - 1 VIEW  Comparison: 02/07/2012 CT  Findings: Cardiomegaly.  Mild lingular opacity.  The cavitary lesion in the right lower lobe is poorly visualized by radiograph. No pneumothorax.  No pleural effusion.  Large bore right IJ catheter tip projects over the proximal SVC.  No acute osseous abnormality.  IMPRESSION: Cardiomegaly.  Mild  lingular opacity.  Original Report Authenticated By: Waneta Martins, M.D.   Dg Chest Port 1 View  02/02/2012  *RADIOLOGY REPORT*  Clinical Data: Coughing up blood with abdominal pain  PORTABLE CHEST - 1 VIEW  Comparison: None.  Findings: There is a dialysis catheter from right IJ approach. Cardiac silhouette is enlarged.  There is mild central venous congestion.  No effusion, infiltrate, or pneumothorax.  IMPRESSION: Cardiomegaly  with mild central venous congestion.  Original Report Authenticated By: Genevive Bi, M.D.    Microbiology: No results found for this or any previous visit (from the past 240 hour(s)).   Labs: Basic Metabolic Panel:  Lab 02/27/12 1610 02/24/12 1233 02/20/12 2320  NA 133* 137 139  K 4.2 4.2 --  CL 98 100 98  CO2 22 23 24   GLUCOSE 77 83 85  BUN 41* 32* 20  CREATININE 9.23* 8.79* 6.80*  CALCIUM 10.5 10.5 10.5  MG -- -- 2.0  PHOS 5.8* 4.6 3.9   Liver Function Tests:  Lab 02/27/12 0758 02/24/12 1233 02/20/12 2320  AST -- -- 35  ALT -- -- 17  ALKPHOS -- -- 71  BILITOT -- -- 0.4  PROT -- -- 8.7*  ALBUMIN 3.2* 3.4* 3.9   No results found for this basename: LIPASE:5,AMYLASE:5 in the last 168 hours No results found for this basename: AMMONIA:5 in the last 168 hours CBC:  Lab 02/27/12 0758 02/27/12 0645 02/26/12 0710 02/25/12 0813 02/24/12 0530 02/20/12 2320  WBC 5.7 5.4 6.4 6.8 7.0 --  NEUTROABS -- -- -- -- -- 4.0  HGB 10.9* 11.4* 11.7* 12.8 12.0 --  HCT 34.3* 36.0 37.6 40.1 37.7 --  MCV 87.9 89.3 91.0 89.3 89.8 --  PLT 110* 119* 97* 93* 106* --   Cardiac Enzymes: No results found for this basename: CKTOTAL:5,CKMB:5,CKMBINDEX:5,TROPONINI:5 in the last 168 hours BNP: No components found with this basename: POCBNP:5 CBG:  Lab 02/26/12 0732 02/25/12 0748 02/24/12 1129 02/24/12 0723 02/23/12 0736  GLUCAP 92 102* 125* 78 89    Time coordinating discharge: 30 minutes  Signed:  Lambert Keto, MD  [facility] 02/27/2012,  11:35 AM

## 2012-02-27 NOTE — Progress Notes (Signed)
[name] Small Progress Note  Subjective:  Pt really without complaints this AM except for her thigh Met with palliative care yesterday - see their note She is seen on hemodialysis  She also asked about when she was to get another AVF and I note in reviewing records that she is to followup with Dr. Myra Gianotti in about 2 weeks to plan for another access.  Objective  Filed Vitals:   02/27/12 0655 02/27/12 0713 02/27/12 0719 02/27/12 0730  BP: 113/57 107/67 100/61 106/63  Pulse: 77 72 73 81  Temp: 97.4 F (36.3 C)     TempSrc: Oral     Resp: 18 18 18 18   Height:      Weight: 70.5 kg (155 lb 6.8 oz)     SpO2: 98%      Physical Exam General:soft spoken BF Heart:S1S2 no S3 Lungs:Grossly clear Abdomen:Soft without tenderness Extremities:trace pretibial edema Dialysis Access: permcath  Lab Results  Component Value Date   INR 2.05* 02/27/2012   INR 1.54* 02/26/2012   INR 1.22 02/25/2012   Assessment/Plan: 1. CP/pleuritic with hx recent PE: Ongoing anticoagulation and pain control. S/p IVC filter. Marland Kitchen NEEDS A PCP TO FOLLOW ANTICOAGULATION at [facility].  INR today 2 2. ESRD S/P failed renal tx - secondary to missed immunosuppression doses/rejection- Continue dialysis on TTS schedule;. Would NOT refer/plan for KTx at this juncture as she is presently not a candidate; no plans to reduce dialysis time (not sure where that idea came from...) 3. Hypertension/volume - off all BP meds; re-eval EDW- beta blocker therapy limited by hypotension 4. Anemia - Hgb >11.5, no ESA; on 100 Venofer q 2 weeks; hold for now 5. Metabolic bone disease - zemplar not needed iPTH 63; Ca borderline elevated, will use a 2 Ca bath here 6. Nutrition: On renal diet- monitor albumin and consider ONS if <3.8 7. CM - longstanding hypertension; 2 D Echo shows significantly low EF and hypokinesis (global).  8. Dialysis permanent access issues - for outpt (see above)  Additional Objective CBC:  Lab 02/27/12 0645  02/26/12 0710 02/25/12 0813 02/24/12 0530 02/23/12 0325 02/20/12 2320  WBC 5.4 6.4 6.8 -- -- --  NEUTROABS -- -- -- -- -- 4.0  HGB 11.4* 11.7* 12.8 -- -- --  HCT 36.0 37.6 40.1 -- -- --  MCV 89.3 91.0 89.3 89.8 90.8 --  PLT PENDING 97* 93* -- -- --   Studies/Results: US Abdomen Complete  02/26/2012  *RADIOLOGY REPORT*  Clinical Data:  Abdominal pain  ULTRASOUND ABDOMEN:  Technique:  Sonography of upper abdominal structures was performed.  Comparison:  None Correlation:  CT abdomen/pelvis 02/03/2012  Gallbladder:  Normally distended without stones or wall thickening. No pericholecystic fluid or sonographic Murphy sign.  Common bile duct:  Normal caliber 3 mm diameter  Liver:  Normal appearance  IVC:  Normal appearance  Pancreas:  Normal appearance  Spleen: Normal appearance, 7.7 cm length  Right kidney:  8.6 cm length.  Cortical thinning and increased cortical echogenicity.  Fairly nodular appearance of the right kidney with multiple cysts up to 1.7 cm diameter.  No gross dominant mass or hydronephrosis demonstrated sonographically.  Left kidney: 9.9 cm length. Marked cortical thinning and increased cortical echogenicity.  Numerous cystic lesions are seen throughout the left kidney, measuring up to 3.1 cm in greatest size.  No gross dominant solid mass or hydronephrosis demonstrated sonographically.  Aorta:  Normal caliber  Other:  No free fluid  IMPRESSION: Bilateral abnormal appearance of the kidneys demonstrating  cortical thinning and increased cortical echogenicity suggesting medical renal disease. Numerous cystic foci are seen within both kidneys, including simple and complicated cysts; no definite dominant renal mass identified. These areas were better visualized on the preceding CT of 02/03/2012. Remainder of exam unremarkable.  Original Report Authenticated By: Lollie Marrow, M.D.   Ir Ivc Filter Plmt / S&i /img Guid/mod Sed  02/25/2012  *RADIOLOGY REPORT*  Clinical data: Previous pulmonary emboli.   Chronic medical noncompliance including with her anticoagulation regimen.  INFERIOR VENACAVOGRAM IVC FILTER PLACEMENT UNDER FLUOROSCOPY  Technique and findings: The procedure, risks (including but not limited to bleeding, infection, organ damage), benefits, and alternatives were explained to the patient.  Questions regarding the procedure were encouraged and answered.  The patient understands and consents to the procedure. The right IJ vein appeared narrowed or occluded just above the tunneled IJ hemodialysis catheter, so a left-sided approach was selected. Patency of the   left IJ vein was confirmed with ultrasound with image documentation. An appropriate skin site was determined. Skin site was marked, prepped with chlorhexidine, and draped using maximum barrier technique. The region was infiltrated locally with 1% lidocaine.  Intravenous Fentanyl and Versed were administered as conscious sedation during continuous cardiorespiratory monitoring by the radiology RN, with a total moderate sedation time of six minutes.  Under real-time ultrasound guidance, the left IJ vein was accessed with a 21 gauge micropuncture needle; the needle tip within the vein was confirmed with ultrasound image documentation.   The needle was exchanged over a 018 guidewire for a transitional dilator, which allow advancement of the [facility] wire into the IVC with the aid of a Kumpe catheter. A long 6 French vascular sheath was placed for inferior venacavography. This demonstrated no caval thrombus. Renal vein inflows were evident.  The TrapEase IVC filter was advanced through the sheath and successfully deployed under fluoroscopy at the L2-3 level. Followup cavagram demonstrates stable filter position  and no evident complication. The sheath was removed and hemostasis achieved at the site. No immediate complication.  IMPRESSION: 1.  Normal IVC. No thrombus or significant anatomic variation. 2.  Technically successful infrarenal IVC filter  placement.  Original Report Authenticated By: Osa Craver, M.D.   Medications:    . DISCONTD: heparin 850 Units/hr (02/25/12 2332)      . acetaminophen  500 mg Oral TID  . calcium acetate  667 mg Oral TID WC  . enoxaparin (LOVENOX) injection  70 mg Subcutaneous Q24H  . multivitamin  1 tablet Oral QHS  . sodium chloride  3 mL Intravenous Q12H  . warfarin  12.5 mg Oral ONCE-1800  . Warfarin - Pharmacist Dosing Inpatient   Does not apply q1800    I  have reviewed scheduled and prn medications. Alvy Alsop B  02/27/2012,7:48 AM  LOS: 7 days

## 2012-02-27 NOTE — Procedures (Signed)
I have personally attended this patient's dialysis session.  I have reduced the goal for volume removal based on exam.  Perm cath BFR 300.  No other changes were made.    [name] Small B

## 2012-02-27 NOTE — Consult Note (Signed)
Reason for Consult:Reqests consultation because pt. Is refusing treatment Referring Physician:Dr. David Stall [name] Small is an 21 y.o. female.  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 cavitary lesion due to necrosis in 01/2012, on coumadin, now transferred  from [facility] where she presented with pleuritic chest pain and dyspnea with INR  1.5.  Pt was [facility] with chest pain and hemoptysis. Found on admission to have  V/Q scan with high probability for PE, bilaterally and pulmonary infarct. She was on heparin  bridge to coumadin. Echo showed EF 20%, seen by Dr. Sharyn Lull, started coreg, continue  lisinopril, plan repeat echo in a few months. Discharged home.  Pt was admitted to [facility] on 4/29 with chest pain worse with inspiration, noted to  have mild hypoxemia. She was started on Heparin drip and started higher doses of coumadin,  INR was 1.9 on transfer to [facility]. Pt endorses mid sternal chest pressure, no radiation, no associated  symptoms, and also sharp pain that is worse when she breathes that is located in her high upper  right back.  pt was seen today. Per Dr. Fredda Hammed, pt is not refusing any treatment here including dialysis. denies missing any follow up care after last the discharge but per family she did. she is living with her 2 friends now and has no transport. per mother pt can not arrange her transport for medical care but pt refuse that. pt plans to stay with her friends now after discharge but mother thinks it is not a good idea due to her care needs. pt gets upset and angry when any details of her answers were asked today. per pt she knows that she can die if she does not follow with her medical care. willing to get the treatment here now and plans to arrange her medicare  transport after discharge. denies any depressive symptoms, able to laugh and smile, no si, no hi, no AVH. At times pt says she knows all her medical problems but at the same she does not appeared to be much concerned about it.  Past Medical History  Diagnosis Date  . 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 Hemodialysis patient   . Hypertension   . Polycystic kidney disease   . Pulmonary emboli 01/2012    Bilateral, moderate clot burden, areas of pulmonary infarction and central necrosis  . CHF (congestive heart failure)   . Cardiomyopathy     Past Surgical History  Procedure Date  . Nephrectomy   . Av fistula placement   . Kidney transplant 2008    failed    Family History  Problem Relation Age of Onset  . Polycystic kidney disease Father     Social History:  reports that she has quit smoking. Her smoking use included Cigarettes. She smoked .2 packs per day. She does not have any smokeless tobacco history on file. She reports that she does not drink alcohol or use illicit drugs.  Allergies:  Allergies  Allergen Reactions  . Morphine And Related Itching    Medications: I have reviewed patient's current medications  Results for orders placed during the hospital encounter of 02/20/12 (from the past 48 hour(s))  GLUCOSE, CAPILLARY  Status: Abnormal   Collection Time   02/25/12  7:48 AM      Component Value Range Comment   Glucose-Capillary 102 (*) 70 - 99 (mg/dL)   CBC     Status: Abnormal   Collection Time   02/25/12  8:13 AM      Component Value Range Comment   WBC 6.8  4.0 - 10.5 (K/uL)    RBC 4.49  3.87 - 5.11 (MIL/uL)    Hemoglobin 12.8  12.0 - 15.0 (g/dL)    HCT 81.1  91.4 - 78.2 (%)    MCV 89.3  78.0 - 100.0 (fL)    MCH 28.5  26.0 - 34.0 (pg)    MCHC 31.9  30.0 - 36.0 (g/dL)    RDW 95.6 (*) 21.3 - 15.5 (%)    Platelets 93 (*) 150 - 400 (K/uL) CONSISTENT WITH  PREVIOUS RESULT  PROTIME-INR     Status: Abnormal   Collection Time   02/25/12  8:13 AM      Component Value Range Comment   Prothrombin Time 15.7 (*) 11.6 - 15.2 (seconds)    INR 1.22  0.00 - 1.49    HEPARIN LEVEL (UNFRACTIONATED)     Status: Normal   Collection Time   02/25/12  8:13 AM      Component Value Range Comment   Heparin Unfractionated 0.52  0.30 - 0.70 (IU/mL)   CBC     Status: Abnormal   Collection Time   02/26/12  7:10 AM      Component Value Range Comment   WBC 6.4  4.0 - 10.5 (K/uL)    RBC 4.13  3.87 - 5.11 (MIL/uL)    Hemoglobin 11.7 (*) 12.0 - 15.0 (g/dL)    HCT 08.6  57.8 - 46.9 (%)    MCV 91.0  78.0 - 100.0 (fL)    MCH 28.3  26.0 - 34.0 (pg)    MCHC 31.1  30.0 - 36.0 (g/dL)    RDW 62.9 (*) 52.8 - 15.5 (%)    Platelets 97 (*) 150 - 400 (K/uL) CONSISTENT WITH PREVIOUS RESULT  PROTIME-INR     Status: Abnormal   Collection Time   02/26/12  7:10 AM      Component Value Range Comment   Prothrombin Time 18.8 (*) 11.6 - 15.2 (seconds)    INR 1.54 (*) 0.00 - 1.49    HEPARIN LEVEL (UNFRACTIONATED)     Status: Normal   Collection Time   02/26/12  7:10 AM      Component Value Range Comment   Heparin Unfractionated 0.36  0.30 - 0.70 (IU/mL)   GLUCOSE, CAPILLARY     Status: Normal   Collection Time   02/26/12  7:32 AM      Component Value Range Comment   Glucose-Capillary 92  70 - 99 (mg/dL)    Comment 1 Notify RN       US Abdomen Complete  02/26/2012  *RADIOLOGY REPORT*  Clinical Data:  Abdominal pain  ULTRASOUND ABDOMEN:  Technique:  Sonography of upper abdominal structures was performed.  Comparison:  None Correlation:  CT abdomen/pelvis 02/03/2012  Gallbladder:  Normally distended without stones or wall thickening. No pericholecystic fluid or sonographic Murphy sign.  Common bile duct:  Normal caliber 3 mm diameter  Liver:  Normal appearance  IVC:  Normal appearance  Pancreas:  Normal appearance  Spleen: Normal appearance, 7.7 cm length  Right kidney:  8.6 cm length.   Cortical thinning and increased cortical echogenicity.  Fairly  nodular appearance of the right kidney with multiple cysts up to 1.7 cm diameter.  No gross dominant mass or hydronephrosis demonstrated sonographically.  Left kidney: 9.9 cm length. Marked cortical thinning and increased cortical echogenicity.  Numerous cystic lesions are seen throughout the left kidney, measuring up to 3.1 cm in greatest size.  No gross dominant solid mass or hydronephrosis demonstrated sonographically.  Aorta:  Normal caliber  Other:  No free fluid  IMPRESSION: Bilateral abnormal appearance of the kidneys demonstrating cortical thinning and increased cortical echogenicity suggesting medical renal disease. Numerous cystic foci are seen within both kidneys, including simple and complicated cysts; no definite dominant renal mass identified. These areas were better visualized on the preceding CT of 02/03/2012. Remainder of exam unremarkable.  Original Report Authenticated By: Lollie Marrow, M.D.   Ir Ivc Filter Plmt / S&i /img Guid/mod Sed  02/25/2012  *RADIOLOGY REPORT*  Clinical data: Previous pulmonary emboli.  Chronic medical noncompliance including with her anticoagulation regimen.  INFERIOR VENACAVOGRAM IVC FILTER PLACEMENT UNDER FLUOROSCOPY  Technique and findings: The procedure, risks (including but not limited to bleeding, infection, organ damage), benefits, and alternatives were explained to the patient.  Questions regarding the procedure were encouraged and answered.  The patient understands and consents to the procedure. The right IJ vein appeared narrowed or occluded just above the tunneled IJ hemodialysis catheter, so a left-sided approach was selected. Patency of the   left IJ vein was confirmed with ultrasound with image documentation. An appropriate skin site was determined. Skin site was marked, prepped with chlorhexidine, and draped using maximum barrier technique. The region was infiltrated locally with 1% lidocaine.   Intravenous Fentanyl and Versed were administered as conscious sedation during continuous cardiorespiratory monitoring by the radiology RN, with a total moderate sedation time of six minutes.  Under real-time ultrasound guidance, the left IJ vein was accessed with a 21 gauge micropuncture needle; the needle tip within the vein was confirmed with ultrasound image documentation.   The needle was exchanged over a 018 guidewire for a transitional dilator, which allow advancement of the [facility] wire into the IVC with the aid of a Kumpe catheter. A long 6 French vascular sheath was placed for inferior venacavography. This demonstrated no caval thrombus. Renal vein inflows were evident.  The TrapEase IVC filter was advanced through the sheath and successfully deployed under fluoroscopy at the L2-3 level. Followup cavagram demonstrates stable filter position  and no evident complication. The sheath was removed and hemostasis achieved at the site. No immediate complication.  IMPRESSION: 1.  Normal IVC. No thrombus or significant anatomic variation. 2.  Technically successful infrarenal IVC filter placement.  Original Report Authenticated By: Thora Lance III, M.D.    negative, no ROS, already in chart  Blood pressure 111/77, pulse 86, temperature 97.5 F (36.4 C), temperature source Oral, resp. rate 18, height 5\' 3"  (1.6 m), weight 69.1 kg (152 lb 5.4 oz), last menstrual period 01/26/2012, SpO2 100.00%. @PHYSEXAMBYAGE2 @  Past Medical History  Diagnosis Date  . 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 Hemodialysis patient   . Hypertension   . Polycystic kidney disease   . Pulmonary emboli 01/2012    Bilateral, moderate clot burden, areas of pulmonary infarction and central necrosis  . CHF (congestive heart failure)   . Cardiomyopathy        Past Surgical History  Procedure Date  . Nephrectomy   .  Av  fistula placement   . Kidney transplant 2008    failed    Allergies:  Allergies  Allergen Reactions  . Morphine And Related Itching    Current Medications:  Prior to Admission medications   Medication Sig Start Date End Date Taking? Authorizing Provider  calcium acetate (PHOSLO) 667 MG capsule Take 667 mg by mouth 3 (three) times daily with meals.     Yes Historical Provider, MD  carvedilol (COREG) 6.25 MG tablet Take 1 tablet (6.25 mg total) by mouth 2 (two) times daily with a meal. 02/14/12 02/13/13 Yes Laveda Norman, MD  enalapril (VASOTEC) 10 MG tablet Take 10 mg by mouth daily.   Yes Historical Provider, MD  hydrALAZINE (APRESOLINE) 25 MG tablet Take 25 mg by mouth 2 (two) times daily with a meal.   Yes Historical Provider, MD  isosorbide dinitrate (ISORDIL) 20 MG tablet Take 20 mg by mouth 3 (three) times daily.   Yes Historical Provider, MD  multivitamin (RENA-VIT) TABS tablet Take 1 tablet by mouth at bedtime. 02/14/12  Yes Laveda Norman, MD  oxyCODONE-acetaminophen (PERCOCET) 5-325 MG per tablet Take 1-2 tablets by mouth every 4 (four) hours as needed. For pain    Yes Historical Provider, MD  promethazine (PHENERGAN) 25 MG tablet Take 25 mg by mouth every 6 (six) hours as needed. For nausea and vomiting    Yes Historical Provider, MD  warfarin (COUMADIN) 5 MG tablet Take 1 tablet (5 mg total) by mouth daily. 02/14/12 02/13/13 Yes Laveda Norman, MD    Social History:    reports that she has quit smoking. Her smoking use included Cigarettes. She smoked .2 packs per day. She does not have any smokeless tobacco history on file. She reports that she does not drink alcohol or use illicit drugs.   Family History:    Family History  Problem Relation Age of Onset  . 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  AXIS IV economic problems, educational problems, housing problems, other psychosocial or environmental problems, problems related to social environment and problems with primary support group psychosocial factors  AXIS V 31-40 impairment in reality testing GAF   41-50       Assessment/Plan: [name] Small pt ~4:30pm 02/26/12  Consultation is conducted to determine pt's willingness to follow treatment regimen.   Pt is sitting up in bed with O2 nasal cannula.   She says she is taking her treatments.  She says she has been going to hemodialysis three times a week.  Her reason for being admitted was that she needed a refill of her medication and the doctor would not write her prescription.  She says she has a place to live with friends.  She does not have transportation.  She knows that she needs a high protein diet and names foods she  needs not eat.  She is very calm and acting younger than stated age.  She has no SI/HI.   Kathline Banbury 02/27/2012, 12:24 AM

## 2012-02-27 NOTE — Progress Notes (Signed)
Clinical Social Work: Psychiatry  Reviewed patient chart and patient has appropriate resources that have been given with follow up at diaylsis center with LCSW.  No other needs or concerns per patient.  Plan today is she will discharge home to a friend's house.  Will sign off. If needs arise please contact.  Ashley Jacobs, MSW LCSW [phone]

## 2012-02-28 ENCOUNTER — Ambulatory Visit: Payer: Medicare Other | Admitting: Vascular Surgery

## 2012-03-06 ENCOUNTER — Ambulatory Visit (HOSPITAL_COMMUNITY)
Admission: RE | Admit: 2012-03-06 | Discharge: 2012-03-06 | Disposition: A | Discharge status: Home / Self Care | Payer: Managed Care, Other (non HMO) | Source: Ambulatory Visit | Attending: Internal Medicine | Admitting: Internal Medicine

## 2012-03-06 VITALS — BP 106/72 | HR 87 | Wt 151.5 lb

## 2012-03-06 DIAGNOSIS — I5022 Chronic systolic (congestive) heart failure: Secondary | ICD-10-CM | POA: Insufficient documentation

## 2012-03-06 DIAGNOSIS — I509 Heart failure, unspecified: Secondary | ICD-10-CM

## 2012-03-06 DIAGNOSIS — I2699 Other pulmonary embolism without acute cor pulmonale: Secondary | ICD-10-CM

## 2012-03-06 MED ORDER — CARVEDILOL 3.125 MG PO TABS: 3.1250 mg | ORAL_TABLET | Freq: Two times a day (BID) | ORAL | Status: DC

## 2012-03-06 NOTE — Patient Instructions (Signed)
Start Carvedilol 3.125 mg Twice daily ON NON-DIALYSIS DAYS ONLY  Your physician recommends that you schedule a follow-up appointment in: 6 weeks
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
## 2012-03-06 NOTE — Progress Notes (Signed)
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   Underwent transplant nephrectomy 08/2011, now with ESRD on HD (TTS). Has missed HD intermittently. Had fistula placed but clotted now dialyzes through RIJ perm cath. Was previously on 4 anti-HTN meds but these had to be stopped due to low BP and the need to come off HD early.   02/14/12 had bilateral PE with pulmonary infarct and questionable cavitary lesion due to necrosis in 01/2012, on coumadin. Although she has not been taking her coumadin as scheduled.  CT angio 4/13 showed moderate clot burden. Echo at the time showed EF 15-20%, severe HK, mod MR (directed posteriorly).    Echo 5/1:Mildly dilated LV with global hypokinesis, EF 20%. Mildly dilated RV with mildly decreased systolic function. Elevated PA diastolic pressure.   She is here for post hospital follow up.  Doing OK. Denies exertional dyspnea. Says she doesn't do much. Can do her grocery shopping without problem. Tolerating HD well. Says BP ok but drops into 90s on dialysis.  No edema. BP meds stopped. INRs being followed Coumadin clinic in [place].  ROS: All systems negative except as listed in HPI, PMH and Problem List.  Past Medical History  Diagnosis Date  . 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 Hemodialysis patient   . Hypertension   . Polycystic kidney disease   . Pulmonary emboli 01/2012    Bilateral, moderate clot burden, areas of pulmonary infarction and central necrosis  . CHF (congestive heart failure)   . Cardiomyopathy     Current Outpatient Prescriptions  Medication Sig Dispense Refill  . calcium acetate  (PHOSLO) 667 MG capsule Take 1,334 mg by mouth 3 (three) times daily with meals.       . multivitamin (RENA-VIT) TABS tablet Take 1 tablet by mouth at bedtime.  30 tablet  0  . oxyCODONE-acetaminophen (PERCOCET) 5-325 MG per tablet Take 1-2 tablets by mouth every 4 (four) hours as needed. For pain      . promethazine (PHENERGAN) 25 MG tablet Take 25 mg by mouth every 6 (six) hours as needed. For nausea and vomiting       . warfarin (COUMADIN) 5 MG tablet Take 1 tablet (5 mg total) by mouth daily.  30 tablet  0     PHYSICAL EXAM: Filed Vitals:   03/06/12 1331  BP: 106/72  Pulse: 87  Weight: 151 lb 8 oz (68.72 kg)  SpO2: 99%    General:  Well appearing. No resp difficulty HEENT: normal Neck: supple. JVP flat. Carotids 2+ bilaterally; no bruits. No lymphadenopathy or thryomegaly appreciated. Cor: PMI normal. Regular rate & rhythm. No rubs/murmurs. +s4 Lungs: clear Abdomen: soft, nontender, nondistended. No hepatosplenomegaly. No bruits or masses. Good bowel sounds. Extremities: no cyanosis, clubbing, rash, edema Neuro: alert & orientedx3, cranial nerves grossly intact. Moves all 4 extremities w/o difficulty. Flat affect   ECG:   ASSESSMENT & PLAN:

## 2012-03-06 NOTE — Assessment & Plan Note (Signed)
Seems compliant with coumadin. Has IVC filter in place. Stressed need to comply with anti-coagulation and f/u.

## 2012-03-08 ENCOUNTER — Encounter: Payer: Self-pay | Admitting: Surgery

## 2012-03-11 ENCOUNTER — Ambulatory Visit: Payer: Medicare Other | Admitting: Surgery

## 2012-03-13 ENCOUNTER — Inpatient Hospital Stay: Payer: Managed Care, Other (non HMO) | Admitting: Internal Medicine

## 2012-04-15 ENCOUNTER — Ambulatory Visit (HOSPITAL_COMMUNITY): Payer: Managed Care, Other (non HMO) | Attending: Internal Medicine

## 2012-04-19 ENCOUNTER — Encounter: Payer: Self-pay | Admitting: Surgery

## 2012-04-22 ENCOUNTER — Ambulatory Visit: Payer: Medicare Other | Admitting: Surgery

## 2012-05-17 ENCOUNTER — Encounter: Payer: Self-pay | Admitting: Surgery

## 2012-05-20 ENCOUNTER — Ambulatory Visit (INDEPENDENT_AMBULATORY_CARE_PROVIDER_SITE_OTHER): Payer: Managed Care, Other (non HMO) | Admitting: Surgery

## 2012-05-20 ENCOUNTER — Encounter: Payer: Self-pay | Admitting: Surgery

## 2012-05-20 ENCOUNTER — Ambulatory Visit (INDEPENDENT_AMBULATORY_CARE_PROVIDER_SITE_OTHER): Payer: Managed Care, Other (non HMO) | Admitting: Vascular Surgery

## 2012-05-20 VITALS — BP 154/120 | HR 62 | Temp 98.1°F | Ht 63.0 in | Wt 160.0 lb

## 2012-05-20 DIAGNOSIS — N186 End stage renal disease: Secondary | ICD-10-CM

## 2012-05-20 DIAGNOSIS — Z01810 Encounter for preprocedural cardiovascular examination: Secondary | ICD-10-CM

## 2012-05-20 NOTE — Progress Notes (Signed)
UE vein mapping duplex performed @ [facility] 05/20/2012

## 2012-05-20 NOTE — Progress Notes (Signed)
[facility] [place]   Patient name: [name] Small MRN: [id number] DOB: 05-29-1991 Sex: female   Referred by: Dr. Briant Cedar  Reason for referral:  Chief Complaint  Patient presents with  . New Evaluation    eval for access placement/Dr. Briant Cedar -  HD TTS    HISTORY OF PRESENT ILLNESS: Patient is referred today for evaluation of dialysis access. She suffers from renal failure secondary to polycystic kidney disease. She has been on hemodialysis since November. She dialyzes on Tuesday Thursday Saturday via a right-sided catheter. She is status post renal transplant approximately 4 years ago which failed secondary to medication noncompliance. The patient also suffers from congestive heart failure which is medically managed including the use of Coumadin.  Past Medical History  Diagnosis Date  . 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 Hemodialysis patient   . Hypertension   . Polycystic kidney disease   . Pulmonary emboli 01/2012    Bilateral, moderate clot burden, areas of pulmonary infarction and central necrosis  . CHF (congestive heart failure)   . Cardiomyopathy     Past Surgical History  Procedure Date  . Nephrectomy   . Av fistula placement   . Kidney transplant 2008    failed    History   Social History  . Marital Status: Single    Spouse Name: N/A    Number of Children: N/A  . Years of Education: N/A   Occupational History  . Not on file.   Social History Main Topics  . Smoking status: Former Smoker -- 0.2 packs/day    Types: Cigarettes    Quit date: 05/06/2012  . Smokeless tobacco: Never Used  . Alcohol Use: No  . Drug Use: No  . Sexually Active: Not on file   Other Topics Concern  . Not on file   Social History Narrative   Was smoking 3 cigs per day. Lives at home with roommates, has some family locally.     Family History    Problem Relation Age of Onset  . Polycystic kidney disease Father     Allergies as of 05/20/2012 - Review Complete 05/20/2012  Allergen Reaction Noted  . Morphine and related Itching 09/28/2011    Current Outpatient Prescriptions on File Prior to Visit  Medication Sig Dispense Refill  . calcium acetate (PHOSLO) 667 MG capsule Take 1,334 mg by mouth 3 (three) times daily with meals.       . carvedilol (COREG) 3.125 MG tablet Take 1 tablet (3.125 mg total) by mouth 2 (two) times daily with a meal. ON NON-DIALYSIS DAYS  60 tablet  6  . multivitamin (RENA-VIT) TABS tablet Take 1 tablet by mouth at bedtime.  30 tablet  0  . oxyCODONE-acetaminophen (PERCOCET) 5-325 MG per tablet Take 1-2 tablets by mouth every 4 (four) hours as needed. For pain      . promethazine (PHENERGAN) 25 MG tablet Take 25 mg by mouth every 6 (six) hours as needed. For nausea and vomiting       . warfarin (COUMADIN) 5 MG tablet Take 1 tablet (5 mg total) by mouth daily.  30 tablet  0     REVIEW OF SYSTEMS: Cardiovascular: No chest pain, chest pressure, palpitations, orthopnea, or dyspnea on exertion. No claudication or rest pain,   history of DVT Pulmonary: No productive cough, asthma or wheezing. Neurologic: No weakness, paresthesias, aphasia, or amaurosis. No  dizziness. Hematologic: No bleeding problems or clotting disorders. Musculoskeletal: No joint pain or joint swelling. Gastrointestinal: No blood in stool or hematemesis Genitourinary: No dysuria or hematuria. Psychiatric:: No history of major depression. Integumentary: No rashes or ulcers. Constitutional: No fever or chills.  PHYSICAL EXAMINATION: General: The patient appears their stated age.  Vital signs are BP 154/120  Pulse 62  Temp 98.1 F (36.7 C) (Oral)  Ht 5\' 3"  (1.6 m)  Wt 160 lb (72.576 kg)  BMI 28.34 kg/m2  SpO2 100% HEENT:  No gross abnormalities Pulmonary: Respirations are non-labored Musculoskeletal: There are no major deformities.    Neurologic: No focal weakness or paresthesias are detected, Skin: There are no ulcer or rashes noted. Psychiatric: The patient has normal affect. Cardiovascular: There is a regular rate and rhythm without significant murmur appreciated. No carotid bruits. Palpable left brachial and radial pulse  Diagnostic Studies: I have ordered and reviewed her vein mapping. She has a very small left cephalic vein and an occluded right cephalic vein. The left basilic vein measures 0.35-0.72 with a small area in the antecubital crease of 0.25.   Assessment:  End-stage renal disease secondary to polycystic kidney disease Plan: Based on the patient's vein mapping I feel that she is a candidate for a left basilic vein transposition. The details of the surgery were discussed in detail with her. We discussed the risk of steal syndrome, the risk of non-maturity, the need for future interventions. All her questions were answered today. I told her that I would place a graft if the basilic vein is not adequate at the time of her operation. She will need to be off her Coumadin for 5 days prior to her operation which is been scheduled for Friday, August 9.     Jorge Ny, M.D. [facility] [place] Office: [phone] Pager:  [phone]

## 2012-05-23 ENCOUNTER — Other Ambulatory Visit: Payer: Self-pay

## 2012-05-23 ENCOUNTER — Encounter (HOSPITAL_COMMUNITY): Payer: Self-pay | Admitting: Pharmacy Technician

## 2012-05-27 NOTE — Procedures (Unsigned)
CEPHALIC VEIN MAPPING  INDICATION:  End-stage renal disease.  HISTORY: Hypertension, CHF, polycystic kidney disease, previous smoker.  EXAM: The right cephalic vein is not compressible.  The right basilic vein is compressible.  Diameter measurements range from 0.24 to 0.45 cm.  The left cephalic vein is compressible.  Diameter measurements range from 0.15 to 0.28 cm.  The left basilic vein is compressible.  Diameter measurements range from 0.25 to 0.72 cm.  See attached worksheet for all measurements.  IMPRESSION: The right cephalic vein is thrombosed with history of arteriovenous fistula placement. Patent  right basilic vein with diameter measurements as described above and on worksheet. Patent left cephalic and basilic veins with diameter measurements as described above and on worksheet.  Note is made of vessel wall thickening versus partial thrombus at the distal upper arm and the distal wrist segments of the left cephalic vein as noted on worksheet.   _________________________________________ V. Charlena Cross, MD  SH/MEDQ  D:  05/20/2012  T:  05/20/2012  Job:  [id number]

## 2012-05-30 ENCOUNTER — Encounter (HOSPITAL_COMMUNITY): Payer: Self-pay | Admitting: *Deleted

## 2012-05-30 MED ORDER — CEFAZOLIN SODIUM-DEXTROSE 2-3 GM-% IV SOLR
2.0000 g | Freq: Once | INTRAVENOUS | Status: AC
  Administered 2012-05-31: 2 g via INTRAVENOUS
  Filled 2012-05-30: qty 50

## 2012-05-30 MED ORDER — SODIUM CHLORIDE 0.9 % IV SOLN: INTRAVENOUS | Status: DC

## 2012-05-30 NOTE — Progress Notes (Signed)
PCPis Dr Lucretia Roers, [facility] in [facility].  Pt does not see a cardiologist. Has not had a stress test.

## 2012-05-31 ENCOUNTER — Ambulatory Visit (HOSPITAL_COMMUNITY)
Admission: RE | Admit: 2012-05-31 | Discharge: 2012-05-31 | Disposition: A | Discharge status: Home / Self Care | Payer: Managed Care, Other (non HMO) | Source: Ambulatory Visit | Attending: Surgery | Admitting: Surgery

## 2012-05-31 ENCOUNTER — Encounter (HOSPITAL_COMMUNITY): Payer: Self-pay

## 2012-05-31 ENCOUNTER — Encounter (HOSPITAL_COMMUNITY): Payer: Self-pay | Admitting: Anesthesiology

## 2012-05-31 ENCOUNTER — Ambulatory Visit (HOSPITAL_COMMUNITY): Payer: Managed Care, Other (non HMO) | Admitting: Anesthesiology

## 2012-05-31 ENCOUNTER — Telehealth: Payer: Self-pay | Admitting: Surgery

## 2012-05-31 ENCOUNTER — Ambulatory Visit (HOSPITAL_COMMUNITY): Payer: Managed Care, Other (non HMO)

## 2012-05-31 ENCOUNTER — Encounter (HOSPITAL_COMMUNITY)
Admission: RE | Disposition: A | Discharge status: Home / Self Care | Payer: Self-pay | Source: Ambulatory Visit | Attending: Surgery

## 2012-05-31 DIAGNOSIS — Q613 Polycystic kidney, unspecified: Secondary | ICD-10-CM | POA: Insufficient documentation

## 2012-05-31 DIAGNOSIS — I120 Hypertensive chronic kidney disease with stage 5 chronic kidney disease or end stage renal disease: Secondary | ICD-10-CM | POA: Insufficient documentation

## 2012-05-31 DIAGNOSIS — N186 End stage renal disease: Secondary | ICD-10-CM

## 2012-05-31 DIAGNOSIS — Z992 Dependence on renal dialysis: Secondary | ICD-10-CM | POA: Insufficient documentation

## 2012-05-31 HISTORY — DX: Personal history of other medical treatment: Z92.89

## 2012-05-31 HISTORY — DX: Anemia, unspecified: D64.9

## 2012-05-31 HISTORY — DX: Cardiac arrhythmia, unspecified: I49.9

## 2012-05-31 LAB — APTT: aPTT: 36 seconds (ref 24–37)

## 2012-05-31 LAB — POCT I-STAT 4, (NA,K, GLUC, HGB,HCT): HCT: 41 % (ref 36.0–46.0)

## 2012-05-31 LAB — PROTIME-INR
INR: 1.25 (ref 0.00–1.49)
Prothrombin Time: 16 seconds — ABNORMAL HIGH (ref 11.6–15.2)

## 2012-05-31 SURGERY — TRANSPOSITION, VEIN, BASILIC
Anesthesia: General | Site: Arm Upper | Laterality: Left | Wound class: Clean

## 2012-05-31 MED ORDER — HYDROMORPHONE HCL PF 1 MG/ML IJ SOLN
0.2500 mg | INTRAMUSCULAR | Status: DC | PRN
  Administered 2012-05-31 (×3): 0.5 mg via INTRAVENOUS

## 2012-05-31 MED ORDER — 0.9 % SODIUM CHLORIDE (POUR BTL) OPTIME
TOPICAL | Status: DC | PRN
  Administered 2012-05-31: 1000 mL

## 2012-05-31 MED ORDER — HEPARIN SODIUM (PORCINE) 1000 UNIT/ML IJ SOLN
INTRAMUSCULAR | Status: DC | PRN
  Administered 2012-05-31: 5000 [IU] via INTRAVENOUS

## 2012-05-31 MED ORDER — ONDANSETRON HCL 4 MG/2ML IJ SOLN
INTRAMUSCULAR | Status: DC | PRN
  Administered 2012-05-31: 4 mg via INTRAVENOUS

## 2012-05-31 MED ORDER — PROPOFOL 10 MG/ML IV EMUL
INTRAVENOUS | Status: DC | PRN
  Administered 2012-05-31: 200 mg via INTRAVENOUS
  Administered 2012-05-31 (×2): 50 mg via INTRAVENOUS

## 2012-05-31 MED ORDER — LIDOCAINE-EPINEPHRINE (PF) 1 %-1:200000 IJ SOLN
INTRAMUSCULAR | Status: DC | PRN
  Administered 2012-05-31: 10 mL

## 2012-05-31 MED ORDER — OXYCODONE HCL 5 MG PO TABS: 5.0000 mg | ORAL_TABLET | Freq: Four times a day (QID) | ORAL | Status: AC | PRN

## 2012-05-31 MED ORDER — OXYCODONE HCL 5 MG PO TABS
ORAL_TABLET | ORAL | Status: AC
  Administered 2012-05-31: 5 mg
  Filled 2012-05-31: qty 1

## 2012-05-31 MED ORDER — HYDROMORPHONE HCL PF 1 MG/ML IJ SOLN
INTRAMUSCULAR | Status: AC
  Filled 2012-05-31: qty 1

## 2012-05-31 MED ORDER — MUPIROCIN 2 % EX OINT: TOPICAL_OINTMENT | Freq: Two times a day (BID) | CUTANEOUS | Status: DC

## 2012-05-31 MED ORDER — SODIUM CHLORIDE 0.9 % IV SOLN
INTRAVENOUS | Status: DC | PRN
  Administered 2012-05-31 (×2): via INTRAVENOUS

## 2012-05-31 MED ORDER — MIDAZOLAM HCL 5 MG/5ML IJ SOLN
INTRAMUSCULAR | Status: DC | PRN
  Administered 2012-05-31: 2 mg via INTRAVENOUS

## 2012-05-31 MED ORDER — PROMETHAZINE HCL 25 MG/ML IJ SOLN: 6.2500 mg | INTRAMUSCULAR | Status: DC | PRN

## 2012-05-31 MED ORDER — PROTAMINE SULFATE 10 MG/ML IV SOLN
INTRAVENOUS | Status: DC | PRN
  Administered 2012-05-31: 50 mg via INTRAVENOUS

## 2012-05-31 MED ORDER — DIPHENHYDRAMINE HCL 50 MG/ML IJ SOLN
INTRAMUSCULAR | Status: AC
  Administered 2012-05-31: 12.5 mg
  Filled 2012-05-31: qty 1

## 2012-05-31 MED ORDER — EPHEDRINE SULFATE 50 MG/ML IJ SOLN
INTRAMUSCULAR | Status: DC | PRN
  Administered 2012-05-31 (×3): 5 mg via INTRAVENOUS
  Administered 2012-05-31: 10 mg via INTRAVENOUS

## 2012-05-31 MED ORDER — LIDOCAINE-EPINEPHRINE (PF) 1 %-1:200000 IJ SOLN
INTRAMUSCULAR | Status: AC
  Filled 2012-05-31: qty 10

## 2012-05-31 MED ORDER — CARVEDILOL 3.125 MG PO TABS
3.1250 mg | ORAL_TABLET | Freq: Once | ORAL | Status: DC
  Filled 2012-05-31: qty 1

## 2012-05-31 MED ORDER — FENTANYL CITRATE 0.05 MG/ML IJ SOLN
INTRAMUSCULAR | Status: DC | PRN
  Administered 2012-05-31: 25 ug via INTRAVENOUS
  Administered 2012-05-31: 50 ug via INTRAVENOUS
  Administered 2012-05-31 (×2): 25 ug via INTRAVENOUS
  Administered 2012-05-31: 50 ug via INTRAVENOUS
  Administered 2012-05-31: 25 ug via INTRAVENOUS
  Administered 2012-05-31: 50 ug via INTRAVENOUS
  Administered 2012-05-31: 25 ug via INTRAVENOUS
  Administered 2012-05-31 (×2): 50 ug via INTRAVENOUS
  Administered 2012-05-31 (×3): 25 ug via INTRAVENOUS

## 2012-05-31 MED ORDER — MUPIROCIN 2 % EX OINT
TOPICAL_OINTMENT | CUTANEOUS | Status: AC
  Administered 2012-05-31: 1 via NASAL
  Filled 2012-05-31: qty 22

## 2012-05-31 MED ORDER — MEPERIDINE HCL 25 MG/ML IJ SOLN: 6.2500 mg | INTRAMUSCULAR | Status: DC | PRN

## 2012-05-31 MED ORDER — CARVEDILOL 3.125 MG PO TABS
ORAL_TABLET | ORAL | Status: AC
  Administered 2012-05-31: 3.125 mg via ORAL
  Filled 2012-05-31: qty 1

## 2012-05-31 MED ORDER — SODIUM CHLORIDE 0.9 % IR SOLN
Status: DC | PRN
  Administered 2012-05-31: 08:00:00

## 2012-05-31 MED ORDER — PHENYLEPHRINE HCL 10 MG/ML IJ SOLN
INTRAMUSCULAR | Status: DC | PRN
  Administered 2012-05-31 (×5): 80 ug via INTRAVENOUS

## 2012-05-31 MED ORDER — DIPHENHYDRAMINE HCL 50 MG/ML IJ SOLN: 12.5000 mg | Freq: Once | INTRAMUSCULAR | Status: DC

## 2012-05-31 SURGICAL SUPPLY — 39 items
CANISTER SUCTION 2500CC (MISCELLANEOUS) ×2 IMPLANT
CLIP TI MEDIUM 6 (CLIP) ×2 IMPLANT
CLIP TI WIDE RED SMALL 6 (CLIP) ×4 IMPLANT
CLOTH BEACON ORANGE TIMEOUT ST (SAFETY) ×2 IMPLANT
COVER PROBE W GEL 5X96 (DRAPES) ×2 IMPLANT
COVER SURGICAL LIGHT HANDLE (MISCELLANEOUS) ×4 IMPLANT
DERMABOND ADHESIVE PROPEN (GAUZE/BANDAGES/DRESSINGS) ×1
DERMABOND ADVANCED (GAUZE/BANDAGES/DRESSINGS) ×1
DERMABOND ADVANCED .7 DNX12 (GAUZE/BANDAGES/DRESSINGS) ×1 IMPLANT
DERMABOND ADVANCED .7 DNX6 (GAUZE/BANDAGES/DRESSINGS) ×1 IMPLANT
ELECT REM PT RETURN 9FT ADLT (ELECTROSURGICAL) ×2
ELECTRODE REM PT RTRN 9FT ADLT (ELECTROSURGICAL) ×1 IMPLANT
GLOVE BIO SURGEON STRL SZ 6.5 (GLOVE) ×4 IMPLANT
GLOVE BIOGEL PI IND STRL 6.5 (GLOVE) ×3 IMPLANT
GLOVE BIOGEL PI IND STRL 7.5 (GLOVE) ×1 IMPLANT
GLOVE BIOGEL PI INDICATOR 6.5 (GLOVE) ×3
GLOVE BIOGEL PI INDICATOR 7.5 (GLOVE) ×1
GLOVE ECLIPSE 6.5 STRL STRAW (GLOVE) ×2 IMPLANT
GLOVE SURG SS PI 7.5 STRL IVOR (GLOVE) ×2 IMPLANT
GOWN PREVENTION PLUS XXLARGE (GOWN DISPOSABLE) ×2 IMPLANT
GOWN STRL NON-REIN LRG LVL3 (GOWN DISPOSABLE) ×4 IMPLANT
HEMOSTAT SNOW SURGICEL 2X4 (HEMOSTASIS) IMPLANT
HEMOSTAT SURGICEL 2X14 (HEMOSTASIS) IMPLANT
KIT BASIN OR (CUSTOM PROCEDURE TRAY) ×2 IMPLANT
KIT ROOM TURNOVER OR (KITS) ×2 IMPLANT
NS IRRIG 1000ML POUR BTL (IV SOLUTION) ×2 IMPLANT
PACK CV ACCESS (CUSTOM PROCEDURE TRAY) ×2 IMPLANT
PAD ARMBOARD 7.5X6 YLW CONV (MISCELLANEOUS) ×4 IMPLANT
SLEEVE SURGEON STRL (DRAPES) ×2 IMPLANT
SUT PROLENE 6 0 CC (SUTURE) ×2 IMPLANT
SUT PROLENE 7 0 BV 1 (SUTURE) ×4 IMPLANT
SUT SILK 2 0 FS (SUTURE) ×2 IMPLANT
SUT VIC AB 3-0 SH 27 (SUTURE) ×3
SUT VIC AB 3-0 SH 27X BRD (SUTURE) ×3 IMPLANT
SUT VICRYL 4-0 PS2 18IN ABS (SUTURE) ×2 IMPLANT
TOWEL OR 17X24 6PK STRL BLUE (TOWEL DISPOSABLE) ×2 IMPLANT
TOWEL OR 17X26 10 PK STRL BLUE (TOWEL DISPOSABLE) ×2 IMPLANT
UNDERPAD 30X30 INCONTINENT (UNDERPADS AND DIAPERS) ×2 IMPLANT
WATER STERILE IRR 1000ML POUR (IV SOLUTION) ×2 IMPLANT

## 2012-05-31 NOTE — H&P (View-Only) (Signed)
[facility] [place]   Patient name: [name] Small MRN: [id number] DOB: 02/26/1991 Sex: female   Referred by: Dr. Mattingly  Reason for referral:  Chief Complaint  Patient presents with  . New Evaluation    eval for access placement/Dr. Mattingly -  HD TTS    HISTORY OF PRESENT ILLNESS: Patient is referred today for evaluation of dialysis access. She suffers from renal failure secondary to polycystic kidney disease. She has been on hemodialysis since November. She dialyzes on Tuesday Thursday Saturday via a right-sided catheter. She is status post renal transplant approximately 4 years ago which failed secondary to medication noncompliance. The patient also suffers from congestive heart failure which is medically managed including the use of Coumadin.  Past Medical History  Diagnosis Date  . 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 Hemodialysis patient   . Hypertension   . Polycystic kidney disease   . Pulmonary emboli 01/2012    Bilateral, moderate clot burden, areas of pulmonary infarction and central necrosis  . CHF (congestive heart failure)   . Cardiomyopathy     Past Surgical History  Procedure Date  . Nephrectomy   . Av fistula placement   . Kidney transplant 2008    failed    History   Social History  . Marital Status: Single    Spouse Name: N/A    Number of Children: N/A  . Years of Education: N/A   Occupational History  . Not on file.   Social History Main Topics  . Smoking status: Former Smoker -- 0.2 packs/day    Types: Cigarettes    Quit date: 05/06/2012  . Smokeless tobacco: Never Used  . Alcohol Use: No  . Drug Use: No  . Sexually Active: Not on file   Other Topics Concern  . Not on file   Social History Narrative   Was smoking 3 cigs per day. Lives at home with roommates, has some family locally.     Family History    Problem Relation Age of Onset  . Polycystic kidney disease Father     Allergies as of 05/20/2012 - Review Complete 05/20/2012  Allergen Reaction Noted  . Morphine and related Itching 09/28/2011    Current Outpatient Prescriptions on File Prior to Visit  Medication Sig Dispense Refill  . calcium acetate (PHOSLO) 667 MG capsule Take 1,334 mg by mouth 3 (three) times daily with meals.       . carvedilol (COREG) 3.125 MG tablet Take 1 tablet (3.125 mg total) by mouth 2 (two) times daily with a meal. ON NON-DIALYSIS DAYS  60 tablet  6  . multivitamin (RENA-VIT) TABS tablet Take 1 tablet by mouth at bedtime.  30 tablet  0  . oxyCODONE-acetaminophen (PERCOCET) 5-325 MG per tablet Take 1-2 tablets by mouth every 4 (four) hours as needed. For pain      . promethazine (PHENERGAN) 25 MG tablet Take 25 mg by mouth every 6 (six) hours as needed. For nausea and vomiting       . warfarin (COUMADIN) 5 MG tablet Take 1 tablet (5 mg total) by mouth daily.  30 tablet  0     REVIEW OF SYSTEMS: Cardiovascular: No chest pain, chest pressure, palpitations, orthopnea, or dyspnea on exertion. No claudication or rest pain,   history of DVT Pulmonary: No productive cough, asthma or wheezing. Neurologic: No weakness, paresthesias, aphasia, or amaurosis. No   dizziness. Hematologic: No bleeding problems or clotting disorders. Musculoskeletal: No joint pain or joint swelling. Gastrointestinal: No blood in stool or hematemesis Genitourinary: No dysuria or hematuria. Psychiatric:: No history of major depression. Integumentary: No rashes or ulcers. Constitutional: No fever or chills.  PHYSICAL EXAMINATION: General: The patient appears their stated age.  Vital signs are BP 154/120  Pulse 62  Temp 98.1 F (36.7 C) (Oral)  Ht 5' 3" (1.6 m)  Wt 160 lb (72.576 kg)  BMI 28.34 kg/m2  SpO2 100% HEENT:  No gross abnormalities Pulmonary: Respirations are non-labored Musculoskeletal: There are no major deformities.    Neurologic: No focal weakness or paresthesias are detected, Skin: There are no ulcer or rashes noted. Psychiatric: The patient has normal affect. Cardiovascular: There is a regular rate and rhythm without significant murmur appreciated. No carotid bruits. Palpable left brachial and radial pulse  Diagnostic Studies: I have ordered and reviewed her vein mapping. She has a very small left cephalic vein and an occluded right cephalic vein. The left basilic vein measures 0.35-0.72 with a small area in the antecubital crease of 0.25.   Assessment:  End-stage renal disease secondary to polycystic kidney disease Plan: Based on the patient's vein mapping I feel that she is a candidate for a left basilic vein transposition. The details of the surgery were discussed in detail with her. We discussed the risk of steal syndrome, the risk of non-maturity, the need for future interventions. All her questions were answered today. I told her that I would place a graft if the basilic vein is not adequate at the time of her operation. She will need to be off her Coumadin for 5 days prior to her operation which is been scheduled for Friday, August 9.     V. Wells Brabham IV, M.D. [facility] [place] Office: [phone] Pager:  [phone]    

## 2012-05-31 NOTE — Anesthesia Postprocedure Evaluation (Signed)
  Anesthesia Post-op Note  Patient: [name] Small  Procedure(s) Performed: Procedure(s) (LRB): BASCILIC VEIN TRANSPOSITION (Left)  Patient Location: PACU  Anesthesia Type: General  Level of Consciousness: awake and sedated  Airway and Oxygen Therapy: Patient Spontanous Breathing  Post-op Pain: mild  Post-op Assessment: Post-op Vital signs reviewed  Post-op Vital Signs: stable  Complications: No apparent anesthesia complications

## 2012-05-31 NOTE — Op Note (Signed)
[facility] [place]  Patient name: [name] Small MRN: [id number] DOB: Aug 30, 1991 Sex: female  05/31/2012 Pre-operative Diagnosis: ESRD  Post-operative diagnosis:  Same Surgeon:  Jorge Ny Assistants:  Doreatha Massed, P.A. Procedure:   Left basilic vein transposition Anesthesia:  General Blood Loss:  See anesthesia record Specimens:  none  Findings:  Excellent basilic vein, small artery (2mm)  Indications:  ESRD, in need of permanent access  Procedure:  The patient was identified in the holding area and taken to [facility] OR ROOM 16  The patient was then placed supine on the table. general anesthesia was administered.  The patient was prepped and draped in the usual sterile fashion.  A time out was called and antibiotics were administered.  Ultrasound was used to evaluate the basilic vein in the left upper arm. It was felt to be adequate. 3 skip incisions were used to isolate the basilic vein. Side branches were tied off between silk ties. The nerve was visualized and protected throughout the dissection of the vein. I dissected the vein out up to the basilic junction which was near the axilla. The vein at the antecubital crease grandson I used the larger of the 2 branches which was still very adequate. Once the vein was fully isolated I dissected out the artery from the lowest incision. The artery was approximately 2 mm and soft. Next, a right angle was placed on the distal vein and it was ligated with a 2-0 silk tie. The vein was then fully distended. It distended to approximately 6-7 mm. It was marked for proper orientation. A subcutaneous tunnel was created with a curved tunneler the vein was then brought through the tunnel. A nature that it maintained its proper orientation. The patient was then heparinized. After the heparin had circulated the artery was occluded with Serafin clamps. A #11 blade was used to make an arteriotomy which was extended with Potts scissors.  The vein was then cut to the appropriate length and spatulated to fit the size of the arteriotomy. A running anastomosis was created with 6-0 Prolene. Prior to completion the appropriate flushing maneuvers were performed and blood flow was reestablished. There was a palpable thrill within the fistula. The patient had a radial Doppler signal which didn't dampened mildly with graft compression. The ulnar signal is difficult to hear with graft compression was biphasic without graft compression. At this point I felt satisfied. The patient's heparin was reversed with 50 mg of protamine. The skin incisions were closed with 30 and 4-0 Vicryl. Dermabond placed on the wound. There were no complications.   Disposition:  To PACU in stable condition.   Juleen China, M.D. [facility] [place] Office: [phone] Pager:  [phone]

## 2012-05-31 NOTE — Progress Notes (Signed)
Dr Boneta Lucks aware pt c/o itching. Order received for benadryl 12.5 mg

## 2012-05-31 NOTE — Anesthesia Preprocedure Evaluation (Signed)
Anesthesia Evaluation  Patient identified by MRN, date of birth, ID band Patient awake    Reviewed: Allergy & Precautions, H&P , NPO status , Patient's Chart, lab work & pertinent test results  Airway Mallampati: I  Neck ROM: Full    Dental  (+) Teeth Intact   Pulmonary pneumonia -,  breath sounds clear to auscultation        Cardiovascular hypertension, +CHF + dysrhythmias Rhythm:Regular Rate:Normal     Neuro/Psych    GI/Hepatic negative GI ROS,   Endo/Other    Renal/GU CRFRenal disease     Musculoskeletal   Abdominal   Peds  Hematology   Anesthesia Other Findings   Reproductive/Obstetrics                           Anesthesia Physical Anesthesia Plan  ASA: III  Anesthesia Plan: General   Post-op Pain Management:    Induction: Intravenous  Airway Management Planned: LMA  Additional Equipment:   Intra-op Plan:   Post-operative Plan: Extubation in OR  Informed Consent: I have reviewed the patients History and Physical, chart, labs and discussed the procedure including the risks, benefits and alternatives for the proposed anesthesia with the patient or authorized representative who has indicated his/her understanding and acceptance.   Dental advisory given  Plan Discussed with: CRNA and Surgeon  Anesthesia Plan Comments:         Anesthesia Quick Evaluation

## 2012-05-31 NOTE — Telephone Encounter (Addendum)
Message copied by Rosalyn Charters on Fri May 31, 2012  3:47 PM ------      Message from: Phillips Odor      Created: Fri May 31, 2012  2:34 PM                   ----- Message -----         From: Nada Libman, MD         Sent: 05/31/2012  10:30 AM           To: Reuel Derby, [name], RN            05/31/2012:                  Assistants:  Doreatha Massed, P.A.      Procedure:   Left basilic vein transposition                  Needs f/u in 2 weeks  Notified pt. Of fu appt. With dr. Myra Gianotti on 06-10-12 9:45

## 2012-05-31 NOTE — Preoperative (Signed)
Beta Blockers   Reason not to administer Beta Blockers:Not Applicable 

## 2012-05-31 NOTE — Transfer of Care (Signed)
Immediate Anesthesia Transfer of Care Note  Patient: [name] Small  Procedure(s) Performed: Procedure(s) (LRB): BASCILIC VEIN TRANSPOSITION (Left)  Patient Location: PACU  Anesthesia Type: General  Level of Consciousness: awake, alert  and oriented  Airway & Oxygen Therapy: Patient Spontanous Breathing and Patient connected to face mask oxygen  Post-op Assessment: Report given to PACU RN  Post vital signs: Reviewed and stable  Complications: No apparent anesthesia complications

## 2012-05-31 NOTE — Interval H&P Note (Signed)
History and Physical Interval Note:  05/31/2012 7:30 AM  [name] Small  has presented today for surgery, with the diagnosis of End Stage Renal Disease  The various methods of treatment have been discussed with the patient and family. After consideration of risks, benefits and other options for treatment, the patient has consented to  Procedure(s) (LRB): BASCILIC VEIN TRANSPOSITION (Left) as a surgical intervention .  The patient's history has been reviewed, patient examined, no change in status, stable for surgery.  I have reviewed the patient's chart and labs.  Questions were answered to the patient's satisfaction.     BRABHAM IV, V. WELLS

## 2012-06-03 ENCOUNTER — Telehealth: Payer: Self-pay | Admitting: *Deleted

## 2012-06-03 NOTE — Telephone Encounter (Signed)
I called patient's pharmacy ( [facility] [address], [place]) and they report that they have not filled our Rx for oxycodone written on 05-31-12. They did report that this patient had received 3 other Rx for oxycodone and tramadol in the past month from 3 different prescribers.

## 2012-06-03 NOTE — Telephone Encounter (Signed)
Patient called wanting more pain meds. She had BVT on 05-31-12 and was given a d/c rx for Oxycodone 5mg  #30 at that time. She c/o slight  incisional pain only. Per patient, she is afebrile and has no drainage, swelling, erythema or hematoma reported. There is no problems with hand swelling, pain or numbness.  She is a dialysis patient and has a postop appt w/ Dr. Myra Gianotti on 06-10-12. I told her that we could not refill her oxycodone, (she did not want any hydrocodone) and for her to consult with dialysis for OTC pain meds. She voiced understanding and will use her remaining oxycodone sparingly until she sees Dr. Myra Gianotti. She will call us back if any other complications.

## 2012-06-07 ENCOUNTER — Encounter: Payer: Self-pay | Admitting: Surgery

## 2012-06-10 ENCOUNTER — Ambulatory Visit: Payer: Managed Care, Other (non HMO) | Admitting: Surgery

## 2012-09-04 ENCOUNTER — Other Ambulatory Visit: Payer: Self-pay | Admitting: *Deleted

## 2012-09-11 ENCOUNTER — Ambulatory Visit (HOSPITAL_COMMUNITY)
Admission: RE | Admit: 2012-09-11 | Discharge: 2012-09-11 | Disposition: A | Discharge status: Home / Self Care | Payer: Managed Care, Other (non HMO) | Source: Ambulatory Visit | Attending: Vascular Surgery | Admitting: Vascular Surgery

## 2012-09-11 ENCOUNTER — Other Ambulatory Visit (HOSPITAL_COMMUNITY): Payer: Self-pay | Admitting: *Deleted

## 2012-09-11 DIAGNOSIS — N186 End stage renal disease: Secondary | ICD-10-CM | POA: Insufficient documentation

## 2012-09-11 DIAGNOSIS — Z452 Encounter for adjustment and management of vascular access device: Secondary | ICD-10-CM | POA: Insufficient documentation

## 2012-09-11 NOTE — Progress Notes (Signed)
1155 Dressing to right upper chest clean dry and intact.  Discharge instructions reviewed with patient and family.  Both voice understanding with repeat back.

## 2012-09-11 NOTE — Progress Notes (Addendum)
[facility] SHORT STAY H&P  CC:  Catheter removal   HPI:  Diatek catheter placed Nov. 2012.  The patient has AV fistula left upper arm that has been functioning well for the last month.  She is currently on dialysis  Past Medical History  Diagnosis Date  . Hemodialysis patient   . Hypertension   . Pulmonary emboli 01/2012    Bilateral, moderate clot burden, areas of pulmonary infarction and central necrosis  . CHF (congestive heart failure)   . Cardiomyopathy   . Dysrhythmia     at times per pt.  . Anemia   . H/O transfusion of packed red blood cells   . 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 Polycystic kidney disease     [facility]  T,Th, S    FH:  Non-Contributory  History   Social History  . Marital Status: Single    Spouse Name: N/A    Number of Children: N/A  . Years of Education: N/A   Occupational History  . Not on file.   Social History Main Topics  . Smoking status: Former Smoker -- 0.2 packs/day    Types: Cigarettes    Quit date: 05/06/2012  . Smokeless tobacco: Never Used  . Alcohol Use: No  . Drug Use: No  . Sexually Active: Not on file   Other Topics Concern  . Not on file   Social History Narrative   Was smoking 3 cigs per day. Lives at home with roommates, has some family locally.     Allergies  Allergen Reactions  . Morphine And Related Itching    Ok with oxycodone    Current Outpatient Prescriptions  Medication Sig Dispense Refill  . warfarin (COUMADIN) 5 MG tablet Take 1 tablet (5 mg total) by mouth daily.  30 tablet  0  . calcium acetate (PHOSLO) 667 MG capsule Take 1,334 mg by mouth 3 (three) times daily with meals.       . carvedilol (COREG) 3.125 MG tablet Take 3.125 mg by mouth 2 (two) times daily with a meal.      . multivitamin (RENA-VIT) TABS tablet Take 1 tablet by mouth at bedtime.  30 tablet   0  . promethazine (PHENERGAN) 25 MG tablet Take 25 mg by mouth every 6 (six) hours as needed. For nausea and vomiting         ROS:  See HPI  PHYSICAL EXAM  Filed Vitals:   09/11/12 1103  BP: 119/71  Pulse: 79  Temp: 98.5 F (36.9 C)  Resp: 18    Gen:  WNL Heart reg rate Lungs CTA Left upper arm AV fistula palpable thrill, no sign of steal Left IJ Diatek skine C/D/I no sign of infection.   Impression: This is a 21 y.o. female here for diatek catheter removal  Plan:  Removal of left diatek catheter  Willa Rough. Thomasena Edis, [place] [facility] [phone] 09/11/2012 11:33 AM   [facility] Catheter Removal Procedure Note  Diagnosis: ESRD with Functioning AVF/AVGG  Plan:  Remove left diatek catheter  Consent signed:  yes Time out completed:  yes Coumadin:  no PT/INR (if applicable):   Other labs:   Procedure: 1.  Sterile prepping and draping over catheter area 2. 6 ml 2% lidocaine plain instilled at removal site. 3.  left catheter removed in its entirety with cuff in  tact. 4.  Complications: none  5. Tip of catheter sent for culture:  no   Patient tolerated procedure well:  yes Pressure held, no bleeding noted, dressing applied Instructions given to the pt regarding wound care and bleeding.  Simple stitch was placed above the exit wound of the catheter and needs to be removed at dialysis in 7 days  Clinton Gallant [facility] 09/11/2012 11:34 AM

## 2012-11-12 ENCOUNTER — Encounter (HOSPITAL_BASED_OUTPATIENT_CLINIC_OR_DEPARTMENT_OTHER): Payer: Self-pay | Admitting: *Deleted

## 2012-11-12 ENCOUNTER — Emergency Department (HOSPITAL_BASED_OUTPATIENT_CLINIC_OR_DEPARTMENT_OTHER): Payer: Managed Care, Other (non HMO)

## 2012-11-12 ENCOUNTER — Emergency Department (HOSPITAL_BASED_OUTPATIENT_CLINIC_OR_DEPARTMENT_OTHER)
Admission: EM | Admit: 2012-11-12 | Discharge: 2012-11-12 | Disposition: A | Discharge status: Home / Self Care | Payer: Managed Care, Other (non HMO) | Attending: Emergency Medicine | Admitting: Emergency Medicine

## 2012-11-12 DIAGNOSIS — I129 Hypertensive chronic kidney disease with stage 1 through stage 4 chronic kidney disease, or unspecified chronic kidney disease: Secondary | ICD-10-CM | POA: Insufficient documentation

## 2012-11-12 DIAGNOSIS — Z992 Dependence on renal dialysis: Secondary | ICD-10-CM | POA: Insufficient documentation

## 2012-11-12 DIAGNOSIS — Z7901 Long term (current) use of anticoagulants: Secondary | ICD-10-CM | POA: Insufficient documentation

## 2012-11-12 DIAGNOSIS — R059 Cough, unspecified: Secondary | ICD-10-CM | POA: Insufficient documentation

## 2012-11-12 DIAGNOSIS — Z86718 Personal history of other venous thrombosis and embolism: Secondary | ICD-10-CM | POA: Insufficient documentation

## 2012-11-12 DIAGNOSIS — R05 Cough: Secondary | ICD-10-CM | POA: Insufficient documentation

## 2012-11-12 DIAGNOSIS — N186 End stage renal disease: Secondary | ICD-10-CM | POA: Insufficient documentation

## 2012-11-12 DIAGNOSIS — Q613 Polycystic kidney, unspecified: Secondary | ICD-10-CM | POA: Insufficient documentation

## 2012-11-12 DIAGNOSIS — Z8679 Personal history of other diseases of the circulatory system: Secondary | ICD-10-CM | POA: Insufficient documentation

## 2012-11-12 DIAGNOSIS — I509 Heart failure, unspecified: Secondary | ICD-10-CM | POA: Insufficient documentation

## 2012-11-12 DIAGNOSIS — R079 Chest pain, unspecified: Secondary | ICD-10-CM

## 2012-11-12 DIAGNOSIS — R071 Chest pain on breathing: Secondary | ICD-10-CM | POA: Insufficient documentation

## 2012-11-12 DIAGNOSIS — Z87891 Personal history of nicotine dependence: Secondary | ICD-10-CM | POA: Insufficient documentation

## 2012-11-12 DIAGNOSIS — Z862 Personal history of diseases of the blood and blood-forming organs and certain disorders involving the immune mechanism: Secondary | ICD-10-CM | POA: Insufficient documentation

## 2012-11-12 DIAGNOSIS — Z79899 Other long term (current) drug therapy: Secondary | ICD-10-CM | POA: Insufficient documentation

## 2012-11-12 LAB — BASIC METABOLIC PANEL
Calcium: 10.8 mg/dL — ABNORMAL HIGH (ref 8.4–10.5)
Creatinine, Ser: 13.1 mg/dL — ABNORMAL HIGH (ref 0.50–1.10)
GFR calc Af Amer: 4 mL/min — ABNORMAL LOW (ref >90)

## 2012-11-12 LAB — CBC WITH DIFFERENTIAL/PLATELET
Basophils Absolute: 0 10*3/uL (ref 0.0–0.1)
Basophils Relative: 0 % (ref 0–1)
Eosinophils Relative: 1 % (ref 0–5)
HCT: 35 % — ABNORMAL LOW (ref 36.0–46.0)
MCHC: 32.3 g/dL (ref 30.0–36.0)
MCV: 92.3 fL (ref 78.0–100.0)
Monocytes Absolute: 0.7 10*3/uL (ref 0.1–1.0)
Neutro Abs: 3.9 10*3/uL (ref 1.7–7.7)
RDW: 14.5 % (ref 11.5–15.5)

## 2012-11-12 LAB — TROPONIN I: Troponin I: <0.3 ng/mL (ref <0.30)

## 2012-11-12 MED ORDER — HYDROMORPHONE HCL PF 2 MG/ML IJ SOLN
2.0000 mg | Freq: Once | INTRAMUSCULAR | Status: AC
  Administered 2012-11-12: 2 mg via INTRAMUSCULAR
  Filled 2012-11-12: qty 1

## 2012-11-12 MED ORDER — ONDANSETRON HCL 4 MG/2ML IJ SOLN
4.0000 mg | Freq: Once | INTRAMUSCULAR | Status: AC
  Administered 2012-11-12: 4 mg via INTRAMUSCULAR
  Filled 2012-11-12: qty 2

## 2012-11-12 MED ORDER — IOHEXOL 350 MG/ML SOLN
80.0000 mL | Freq: Once | INTRAVENOUS | Status: AC | PRN
  Administered 2012-11-12: 80 mL via INTRAVENOUS

## 2012-11-12 MED ORDER — DIPHENHYDRAMINE HCL 25 MG PO CAPS
25.0000 mg | ORAL_CAPSULE | Freq: Once | ORAL | Status: AC
  Administered 2012-11-12: 25 mg via ORAL
  Filled 2012-11-12: qty 1

## 2012-11-12 MED ORDER — OXYCODONE-ACETAMINOPHEN 5-325 MG PO TABS
2.0000 | ORAL_TABLET | Freq: Once | ORAL | Status: AC
  Administered 2012-11-12: 2 via ORAL
  Filled 2012-11-12 (×2): qty 2

## 2012-11-12 MED ORDER — OXYCODONE-ACETAMINOPHEN 5-325 MG PO TABS: 2.0000 | ORAL_TABLET | ORAL | Status: DC | PRN

## 2012-11-12 NOTE — ED Notes (Signed)
Chest pain x 3 weeks. States she was treated for chest wall pain with cough 2 weeks ago with Percocet but ran out. She no longer has the cough but pain continues. She has pain in her left lateral chest when she sneezes and pain in her right anterior chest the rest of the time.

## 2012-11-12 NOTE — ED Notes (Signed)
RN Earlene Plater spoke with Pt. Dialysis center  Spoke with [name] at dialysis center.  Pt. Is to be seen at 7am for her appt.

## 2012-11-12 NOTE — ED Provider Notes (Signed)
History     CSN: [id number]  Arrival date & time 11/12/12  1124   First MD Initiated Contact with Patient 11/12/12 1223      Chief Complaint  Patient presents with  . Chest Pain    (Consider location/radiation/quality/duration/timing/severity/associated sxs/prior treatment) Patient is a 22 y.o. female presenting with chest pain. The history is provided by the patient. No language interpreter was used.  Chest Pain The chest pain began 1 - 2 weeks ago. Duration of episode(s) is 2 weeks. Chest pain occurs constantly. The chest pain is worsening. The pain is associated with breathing and exertion. At its most intense, the pain is at 8/10. The pain is currently at 8/10. The severity of the pain is severe. The quality of the pain is described as aching. The pain does not radiate. Chest pain is worsened by deep breathing. Primary symptoms include cough. Pertinent negatives for primary symptoms include no fever. She tried nothing for the symptoms. There are no known risk factors.  Her past medical history is significant for PE. Past medical history comments: renal failure, polycystic kidney disease     Past Medical History  Diagnosis Date  . Hemodialysis patient   . Hypertension   . Pulmonary emboli 01/2012    Bilateral, moderate clot burden, areas of pulmonary infarction and central necrosis  . CHF (congestive heart failure)   . Cardiomyopathy   . Dysrhythmia     at times per pt.  . Anemia   . H/O transfusion of packed red blood cells   . 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 Polycystic kidney disease     [facility]  T,Th, S    Past Surgical History  Procedure Date  . Nephrectomy   . Av fistula placement   . Kidney transplant 2008    failed  . Tonsillectomy     as a child.  . Adenoidectomy     Family History  Problem Relation Age of Onset  . Polycystic  kidney disease Father     History  Substance Use Topics  . Smoking status: Former Smoker -- 0.2 packs/day    Types: Cigarettes    Quit date: 05/06/2012  . Smokeless tobacco: Never Used  . Alcohol Use: No    OB History    Grav Para Term Preterm Abortions TAB SAB Ect Mult Living                  Review of Systems  Constitutional: Negative for fever.  Respiratory: Positive for cough.   Cardiovascular: Positive for chest pain.  All other systems reviewed and are negative.    Allergies  Morphine and related; Tramadol; and Vicodin  Home Medications   Current Outpatient Rx  Name  Route  Sig  Dispense  Refill  . CALCIUM ACETATE 667 MG PO CAPS   Oral   Take 1,334 mg by mouth 3 (three) times daily with meals.          Marland Kitchen CARVEDILOL 3.125 MG PO TABS   Oral   Take 3.125 mg by mouth 2 (two) times daily with a meal.         . RENA-VITE PO TABS   Oral   Take 1 tablet by mouth at bedtime.   30 tablet   0   . PROMETHAZINE HCL 25 MG PO TABS   Oral   Take 25 mg by mouth  every 6 (six) hours as needed. For nausea and vomiting          . WARFARIN SODIUM 5 MG PO TABS   Oral   Take 1 tablet (5 mg total) by mouth daily.   30 tablet   0     Per INR. Take at 6:00 PM     BP 145/98  Pulse 104  Temp 98.3 F (36.8 C) (Oral)  Resp 20  SpO2 100%  Physical Exam  Nursing note and vitals reviewed. Constitutional: She is oriented to person, place, and time. She appears well-developed and well-nourished.  HENT:  Head: Normocephalic and atraumatic.  Eyes: Conjunctivae normal are normal. Pupils are equal, round, and reactive to light.  Neck: Normal range of motion.  Cardiovascular: Normal rate and normal heart sounds.   Pulmonary/Chest: Effort normal and breath sounds normal.  Abdominal: Soft. There is no tenderness.  Musculoskeletal: Normal range of motion.  Neurological: She is alert and oriented to person, place, and time.  Skin: Skin is warm.  Psychiatric: She has a  normal mood and affect.    ED Course  Procedures (including critical care time)  Labs Reviewed - No data to display No results found.   1. Chest pain       MDM  Pt reports she feel better after injection.   Pt did not have any pain relief with percocet.   Ct reports IV infiltrated.   Ice to area,      Results for orders placed during the hospital encounter of 11/12/12  CBC WITH DIFFERENTIAL      Component Value Range   WBC 7.0  4.0 - 10.5 K/uL   RBC 3.79 (*) 3.87 - 5.11 MIL/uL   Hemoglobin 11.3 (*) 12.0 - 15.0 g/dL   HCT 45.4 (*) 09.8 - 11.9 %   MCV 92.3  78.0 - 100.0 fL   MCH 29.8  26.0 - 34.0 pg   MCHC 32.3  30.0 - 36.0 g/dL   RDW 14.7  82.9 - 56.2 %   Platelets 224  150 - 400 K/uL   Neutrophils Relative 56  43 - 77 %   Neutro Abs 3.9  1.7 - 7.7 K/uL   Lymphocytes Relative 34  12 - 46 %   Lymphs Abs 2.4  0.7 - 4.0 K/uL   Monocytes Relative 10  3 - 12 %   Monocytes Absolute 0.7  0.1 - 1.0 K/uL   Eosinophils Relative 1  0 - 5 %   Eosinophils Absolute 0.1  0.0 - 0.7 K/uL   Basophils Relative 0  0 - 1 %   Basophils Absolute 0.0  0.0 - 0.1 K/uL  BASIC METABOLIC PANEL      Component Value Range   Sodium 136  135 - 145 mEq/L   Potassium 4.7  3.5 - 5.1 mEq/L   Chloride 90 (*) 96 - 112 mEq/L   CO2 25  19 - 32 mEq/L   Glucose, Bld 102 (*) 70 - 99 mg/dL   BUN 55 (*) 6 - 23 mg/dL   Creatinine, Ser 13.08 (*) 0.50 - 1.10 mg/dL   Calcium 65.7 (*) 8.4 - 10.5 mg/dL   GFR calc non Af Amer 4 (*) >90 mL/min   GFR calc Af Amer 4 (*) >90 mL/min  TROPONIN I      Component Value Range   Troponin I <0.30  <0.30 ng/mL  D-DIMER, QUANTITATIVE      Component Value Range   D-Dimer, Quant 1.32 (*)  0.00 - 0.48 ug/mL-FEU   Ct Angio Chest Pe W/cm &/or Wo Cm  11/12/2012  *RADIOLOGY REPORT*  Clinical Data: Right chest pain.  Cough.  End-stage renal disease. Cardiomyopathy.  Dysrhythmia.  CT ANGIOGRAPHY CHEST  Technique:  Multidetector CT imaging of the chest using the standard protocol  during bolus administration of intravenous contrast. Multiplanar reconstructed images including MIPs were obtained and reviewed to evaluate the vascular anatomy.  Contrast: 80mL OMNIPAQUE IOHEXOL 350 MG/ML SOLN  Comparison: 02/07/2012  Findings: The patient was unable to control breathing during the examination, resulting in considerable motion artifact.  Given the lack of control over breathing, repeat attempts were not made. Segments of the pulmonary arterial tree are obscured.  In the portion of the pulmonary arterial tree not obscured by motion artifact, which includes most of the pulmonary arterial tree, no embolus is observed.  Residual anterior mediastinal thymic tissue noted.  Left ventricular prominence noted.  Evidence of polycystic kidney disease noted in the upper poles of the kidneys.  Thoracic aorta and branch vessels appear unremarkable.  There is subsegmental atelectasis or scarring in the posterior basal segment right lower lobe.  IMPRESSION:  1.  No embolus identified.  A small but significant portion of the pulmonary arterial tree is completely obscured by breathing motion artifact, lowering exam sensitivity and specificity. 2.  Cardiomegaly involving the left ventricle. 3.  Polycystic kidney disease.   Original Report Authenticated By: Gaylyn Rong, M.D.    US Venous Img Lower Bilateral  11/12/2012  *RADIOLOGY REPORT*  Clinical Data: Chest pain, history pulmonary embolism, DVT, polycystic kidney disease, end-stage renal disease on dialysis, hypertension, CHF, cardiomyopathy  VENOUS DUPLEX ULTRASOUND OF BILATERAL LOWER EXTREMITIES  Technique:  Gray-scale sonography with graded compression, as well as color Doppler and duplex ultrasound, were performed to evaluate the deep venous system of both lower extremities from the level of the common femoral vein through the popliteal and proximal calf veins.  Spectral Doppler was utilized to evaluate flow at rest and with distal augmentation  maneuvers.  Comparison:  None  Findings: Deep venous systems appear patent and compressible from groin through popliteal fossa bilaterally.  Spontaneous venous flow present with intact augmentation and evidence of respiratory phasicity.  No intraluminal thrombus identified.  Visualized portions of the greater saphenous systems appear patent.  IMPRESSION: No evidence of deep venous thrombosis in either lower extremity.   Original Report Authenticated By: Ulyses Southward, M.D.   Dr. Fredderick Phenix in to see and examine    Date: 11/12/2012  Rate: 72  Rhythm: normal sinus rhythm  QRS Axis: normal  Intervals: normal  ST/T Wave abnormalities: normal  Conduction Disutrbances:first-degree A-V block   Narrative Interpretation:   Old EKG Reviewed: unchanged Pt has a history of PE.    RN spoke to dialysis center,   Pt will have dialysis at 7 am tomorrow.   Pt given rx for percocet for treatment of pain.   Pt advised to see her primary MD for recheck  Elson Areas, [place] 11/12/12 1633  Lonia Skinner [place], [place] 11/12/12 1758

## 2012-11-12 NOTE — ED Notes (Signed)
Pt. Has been given oral liquids and is in no distress.

## 2012-11-14 NOTE — ED Provider Notes (Signed)
Medical screening examination/treatment/procedure(s) were conducted as a shared visit with non-physician practitioner(s) and myself.  I personally evaluated the patient during the encounter   Rolan Bucco, MD 11/14/12 [phone]

## 2012-12-07 ENCOUNTER — Encounter (HOSPITAL_BASED_OUTPATIENT_CLINIC_OR_DEPARTMENT_OTHER): Payer: Self-pay | Admitting: *Deleted

## 2012-12-07 ENCOUNTER — Emergency Department (HOSPITAL_BASED_OUTPATIENT_CLINIC_OR_DEPARTMENT_OTHER): Payer: Managed Care, Other (non HMO)

## 2012-12-07 ENCOUNTER — Emergency Department (HOSPITAL_BASED_OUTPATIENT_CLINIC_OR_DEPARTMENT_OTHER)
Admission: EM | Admit: 2012-12-07 | Discharge: 2012-12-08 | Disposition: A | Discharge status: Home / Self Care | Payer: Managed Care, Other (non HMO) | Attending: Emergency Medicine | Admitting: Emergency Medicine

## 2012-12-07 DIAGNOSIS — I509 Heart failure, unspecified: Secondary | ICD-10-CM | POA: Insufficient documentation

## 2012-12-07 DIAGNOSIS — Z8679 Personal history of other diseases of the circulatory system: Secondary | ICD-10-CM | POA: Insufficient documentation

## 2012-12-07 DIAGNOSIS — Z87891 Personal history of nicotine dependence: Secondary | ICD-10-CM | POA: Insufficient documentation

## 2012-12-07 DIAGNOSIS — Z86711 Personal history of pulmonary embolism: Secondary | ICD-10-CM | POA: Insufficient documentation

## 2012-12-07 DIAGNOSIS — Z87448 Personal history of other diseases of urinary system: Secondary | ICD-10-CM | POA: Insufficient documentation

## 2012-12-07 DIAGNOSIS — Z862 Personal history of diseases of the blood and blood-forming organs and certain disorders involving the immune mechanism: Secondary | ICD-10-CM | POA: Insufficient documentation

## 2012-12-07 DIAGNOSIS — Z940 Kidney transplant status: Secondary | ICD-10-CM | POA: Insufficient documentation

## 2012-12-07 DIAGNOSIS — R079 Chest pain, unspecified: Secondary | ICD-10-CM | POA: Insufficient documentation

## 2012-12-07 DIAGNOSIS — Z992 Dependence on renal dialysis: Secondary | ICD-10-CM | POA: Insufficient documentation

## 2012-12-07 DIAGNOSIS — R220 Localized swelling, mass and lump, head: Secondary | ICD-10-CM | POA: Insufficient documentation

## 2012-12-07 DIAGNOSIS — N186 End stage renal disease: Secondary | ICD-10-CM | POA: Insufficient documentation

## 2012-12-07 DIAGNOSIS — Z79899 Other long term (current) drug therapy: Secondary | ICD-10-CM | POA: Insufficient documentation

## 2012-12-07 DIAGNOSIS — I120 Hypertensive chronic kidney disease with stage 5 chronic kidney disease or end stage renal disease: Secondary | ICD-10-CM | POA: Insufficient documentation

## 2012-12-07 MED ORDER — ONDANSETRON HCL 4 MG/2ML IJ SOLN
4.0000 mg | Freq: Once | INTRAMUSCULAR | Status: AC
  Administered 2012-12-07: 4 mg via INTRAVENOUS
  Filled 2012-12-07: qty 2

## 2012-12-07 MED ORDER — HYDROMORPHONE HCL PF 1 MG/ML IJ SOLN
1.0000 mg | Freq: Once | INTRAMUSCULAR | Status: AC
  Administered 2012-12-08: 1 mg via INTRAVENOUS
  Filled 2012-12-07: qty 1

## 2012-12-07 MED ORDER — SODIUM CHLORIDE 0.9 % IV SOLN: INTRAVENOUS | Status: DC

## 2012-12-07 NOTE — ED Notes (Signed)
Pt is here for evaluation of CP in right chest which is sharp and with inspiration she feels tightness.  Pt states that this has been going on for " a couple of months".  Pt had a PE back in June and was on blood thinners until November.  Pt states that this pain began in January and feels like it did when she had a PE last.  Pt denies any sob with this.  No swelling in extremities

## 2012-12-07 NOTE — ED Provider Notes (Signed)
History  This chart was scribed for Shelda Jakes, MD by Shari Heritage, ED Scribe. The patient was seen in room MH01/MH01. Patient's care was started at 2313.   CSN: [id number]  Arrival date & time 12/07/12  2218   First MD Initiated Contact with Patient 12/07/12 2313      Chief Complaint  Patient presents with  . Chest Pain     Patient is a 22 y.o. female presenting with chest pain. The history is provided by the patient. No language interpreter was used.  Chest Pain Pain location:  R chest Pain quality: sharp and tightness   Pain radiates to:  Does not radiate Pain radiates to the back: no   Pain severity:  Severe Timing:  Constant (at this time) Chronicity:  Recurrent Context: breathing   Relieved by:  Nothing Associated symptoms: no abdominal pain, no back pain, no cough, no fatigue, no headache and no shortness of breath   Risk factors: hypertension and prior DVT/PE     HPI Comments: [name] Small is a 22 y.o. female with history of hemodialysis, HTN, PE, congestive heart failure and end stage renal disease who presents to the Emergency Department complaining of moderate to severe, non-radiating, sharp right-sided chest pain that is also tightness with inspiration. Patient rates pain as 9/10. Patient states that pain has been ongoing for several months and that is it similar to prior pain associated with PE. Pain is worse with deep breaths. Patient denies fever, cough, congestion, visual changes, neck pain, back pain, abdominal pain, nausea, vomiting, diarrhea, rash, headache, leg swelling or dysuria. No history of bleeding easily at this time. Patient's dialysis schedule is Tuesday/Thursday/Saturday and her last treatment was today. She has a history of PE in June 2013 and patient states that she stopped taking anticoagulants in November 2013.   Previous Chart Review Per medical records, patient was seen for similar pain on 11/12/12. Patient had BMP, troponin, D-dimer and  CBC with diff as well as a CT angio and [place] of bilateral lower extremities. Labs and imaging studies were reassuring and showed no evidence of acute cardiac episode or DVT in either bilateral lower extremity. Patient was discharged with instructions to follow up with PCP.   Past Medical History  Diagnosis Date  . Hemodialysis patient   . Hypertension   . Pulmonary emboli 01/2012    Bilateral, moderate clot burden, areas of pulmonary infarction and central necrosis  . CHF (congestive heart failure)   . Cardiomyopathy   . Dysrhythmia     at times per pt.  . Anemia   . H/O transfusion of packed red blood cells   . 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 Polycystic kidney disease     [facility]  T,Th, S    Past Surgical History  Procedure Laterality Date  . Nephrectomy    . Av fistula placement    . Kidney transplant  2008    failed  . Tonsillectomy      as a child.  . Adenoidectomy      Family History  Problem Relation Age of Onset  . Polycystic kidney disease Father     History  Substance Use Topics  . Smoking status: Former Smoker -- 0.20 packs/day    Types: Cigarettes    Quit date: 05/06/2012  . Smokeless tobacco: Never Used  . Alcohol Use: No  OB History   Grav Para Term Preterm Abortions TAB SAB Ect Mult Living                  Review of Systems  Constitutional: Negative for fatigue.  HENT: Negative for congestion and neck pain.   Eyes: Negative for visual disturbance.  Respiratory: Negative for cough and shortness of breath.   Cardiovascular: Positive for chest pain. Negative for leg swelling.  Gastrointestinal: Negative for abdominal pain.  Genitourinary: Negative for dysuria.  Musculoskeletal: Negative for back pain.  Neurological: Negative for headaches.  Hematological: Does not bruise/bleed easily.    Allergies  Morphine and  related; Tramadol; and Vicodin  Home Medications   Current Outpatient Rx  Name  Route  Sig  Dispense  Refill  . calcium acetate (PHOSLO) 667 MG capsule   Oral   Take 1,334 mg by mouth 3 (three) times daily with meals.          . carvedilol (COREG) 3.125 MG tablet   Oral   Take 3.125 mg by mouth 2 (two) times daily with a meal.         . multivitamin (RENA-VIT) TABS tablet   Oral   Take 1 tablet by mouth at bedtime.   30 tablet   0   . oxyCODONE-acetaminophen (PERCOCET/ROXICET) 5-325 MG per tablet   Oral   Take 2 tablets by mouth every 4 (four) hours as needed for pain.   16 tablet   0   . oxyCODONE-acetaminophen (PERCOCET/ROXICET) 5-325 MG per tablet   Oral   Take 1 tablet by mouth every 8 (eight) hours as needed for pain.   15 tablet   0   . promethazine (PHENERGAN) 25 MG tablet   Oral   Take 25 mg by mouth every 6 (six) hours as needed. For nausea and vomiting          . warfarin (COUMADIN) 5 MG tablet   Oral   Take 1 tablet (5 mg total) by mouth daily.   30 tablet   0     Per INR. Take at 6:00 PM     Triage Vitals: BP 141/94  Pulse 80  Temp(Src) 98.1 F (36.7 C) (Oral)  SpO2 100%  LMP 11/30/2012  Physical Exam  Constitutional: She is oriented to person, place, and time. She appears well-developed and well-nourished.  HENT:  Head: Normocephalic and atraumatic.  Mouth/Throat: Oropharynx is clear and moist and mucous membranes are normal. Mucous membranes are not dry.  Eyes: EOM are normal. Pupils are equal, round, and reactive to light.  Neck: Normal range of motion. Neck supple.  Cardiovascular: Normal rate, regular rhythm and normal heart sounds.   No murmur heard. Pulmonary/Chest: Effort normal and breath sounds normal. No respiratory distress. She has no wheezes. She has no rales.  Abdominal: Soft. Bowel sounds are normal. There is no tenderness.  Musculoskeletal: Normal range of motion. She exhibits no edema.  No bilateral lower extremity  edema.  Neurological: She is alert and oriented to person, place, and time.  Skin: Skin is warm and dry. No rash noted.    ED Course  Procedures (including critical care time) DIAGNOSTIC STUDIES: Oxygen Saturation is 100% on room air, normal by my interpretation.    COORDINATION OF CARE: 11:24 PM- Patient informed of current plan for treatment and evaluation and agrees with plan at this time.    Results for orders placed during the hospital encounter of 12/07/12  CBC WITH DIFFERENTIAL  Result Value Range   WBC 10.7 (*) 4.0 - 10.5 K/uL   RBC 3.21 (*) 3.87 - 5.11 MIL/uL   Hemoglobin 9.7 (*) 12.0 - 15.0 g/dL   HCT 16.1 (*) 09.6 - 04.5 %   MCV 93.8  78.0 - 100.0 fL   MCH 30.2  26.0 - 34.0 pg   MCHC 32.2  30.0 - 36.0 g/dL   RDW 40.9  81.1 - 91.4 %   Platelets 227  150 - 400 K/uL   Neutrophils Relative 59  43 - 77 %   Lymphocytes Relative 32  12 - 46 %   Monocytes Relative 8  3 - 12 %   Eosinophils Relative 1  0 - 5 %   Basophils Relative 0  0 - 1 %   Neutro Abs 6.3  1.7 - 7.7 K/uL   Lymphs Abs 3.4  0.7 - 4.0 K/uL   Monocytes Absolute 0.9  0.1 - 1.0 K/uL   Eosinophils Absolute 0.1  0.0 - 0.7 K/uL   Basophils Absolute 0.0  0.0 - 0.1 K/uL   RBC Morphology ELLIPTOCYTES     WBC Morphology ATYPICAL LYMPHOCYTES     Smear Review PLATELET COUNT CONFIRMED BY SMEAR    BASIC METABOLIC PANEL      Result Value Range   Sodium 138  135 - 145 mEq/L   Potassium 4.4  3.5 - 5.1 mEq/L   Chloride 99  96 - 112 mEq/L   CO2 25  19 - 32 mEq/L   Glucose, Bld 85  70 - 99 mg/dL   BUN 34 (*) 6 - 23 mg/dL   Creatinine, Ser 78.29 (*) 0.50 - 1.10 mg/dL   Calcium 56.2  8.4 - 13.0 mg/dL   GFR calc non Af Amer 4 (*) >90 mL/min   GFR calc Af Amer 5 (*) >90 mL/min     Dg Chest 2 View  12/08/2012  *RADIOLOGY REPORT*  Clinical Data: Chest pain  CHEST - 2 VIEW  Comparison: 11/12/2012  Findings: The heart size and mediastinal contours are within normal limits.  Both lungs are clear.  The visualized  skeletal structures are unremarkable.  IMPRESSION: Negative exam.   Original Report Authenticated By: Signa Kell, M.D.     Date: 12/08/2012  Rate: 81  Rhythm: normal sinus rhythm  QRS Axis: normal  Intervals: normal  ST/T Wave abnormalities: normal  Conduction Disutrbances:first-degree A-V block   Narrative Interpretation:   Old EKG Reviewed: unchanged EKG is unchanged from 11/12/2012   1. Chest pain       MDM   Patient seen 2 weeks ago for the same complaint. Persistent right sided chest pain. At that time patient had CT angios chest was negative for PE. And also had lower extremity Doppler studies which were negative for DVT. Patient's had these current symptoms for the past 2 months. Patient does have a history of PE in the past that was diagnosed in April of 2013 stop taking Coumadin in November. However doubt that the symptoms based on the recent studies 2 weeks ago that this is related to a PE. EKG without acute changes chest x-ray is normal. Labs without significant amount. Patient was dialyzed today. Patient will need to followup with her primary care Dr. about the chronic chest pain.     I personally performed the services described in this documentation, which was scribed in my presence. The recorded information has been reviewed and is accurate.     Shelda Jakes, MD 12/08/12 [phone]

## 2012-12-07 NOTE — ED Notes (Signed)
Pt also has facial swelling secondary to new facial piercings in her cheeks

## 2012-12-08 ENCOUNTER — Emergency Department (HOSPITAL_BASED_OUTPATIENT_CLINIC_OR_DEPARTMENT_OTHER): Payer: Managed Care, Other (non HMO)

## 2012-12-08 LAB — CBC WITH DIFFERENTIAL/PLATELET
Eosinophils Absolute: 0.1 10*3/uL (ref 0.0–0.7)
MCH: 30.2 pg (ref 26.0–34.0)
MCHC: 32.2 g/dL (ref 30.0–36.0)
Monocytes Absolute: 0.9 10*3/uL (ref 0.1–1.0)
Neutrophils Relative %: 59 % (ref 43–77)
Platelets: 227 10*3/uL (ref 150–400)

## 2012-12-08 LAB — BASIC METABOLIC PANEL
BUN: 34 mg/dL — ABNORMAL HIGH (ref 6–23)
Chloride: 99 mEq/L (ref 96–112)
Creatinine, Ser: 11.1 mg/dL — ABNORMAL HIGH (ref 0.50–1.10)
GFR calc Af Amer: 5 mL/min — ABNORMAL LOW (ref >90)
GFR calc non Af Amer: 4 mL/min — ABNORMAL LOW (ref >90)

## 2012-12-08 MED ORDER — OXYCODONE-ACETAMINOPHEN 5-325 MG PO TABS: 1.0000 | ORAL_TABLET | Freq: Three times a day (TID) | ORAL | Status: DC | PRN

## 2012-12-08 MED ORDER — DIPHENHYDRAMINE HCL 50 MG/ML IJ SOLN
INTRAMUSCULAR | Status: AC
  Administered 2012-12-08: 25 mg
  Filled 2012-12-08: qty 1

## 2012-12-08 NOTE — ED Notes (Signed)
Patient ready for CT scan after EDP discussion with CT about iv access.

## 2012-12-24 ENCOUNTER — Encounter (HOSPITAL_BASED_OUTPATIENT_CLINIC_OR_DEPARTMENT_OTHER): Payer: Self-pay | Admitting: *Deleted

## 2012-12-24 ENCOUNTER — Emergency Department (HOSPITAL_BASED_OUTPATIENT_CLINIC_OR_DEPARTMENT_OTHER)
Admission: EM | Admit: 2012-12-24 | Discharge: 2012-12-24 | Disposition: A | Discharge status: Home / Self Care | Payer: Managed Care, Other (non HMO) | Attending: Emergency Medicine | Admitting: Emergency Medicine

## 2012-12-24 ENCOUNTER — Emergency Department (HOSPITAL_BASED_OUTPATIENT_CLINIC_OR_DEPARTMENT_OTHER): Payer: Managed Care, Other (non HMO)

## 2012-12-24 DIAGNOSIS — Z79899 Other long term (current) drug therapy: Secondary | ICD-10-CM | POA: Insufficient documentation

## 2012-12-24 DIAGNOSIS — I120 Hypertensive chronic kidney disease with stage 5 chronic kidney disease or end stage renal disease: Secondary | ICD-10-CM | POA: Insufficient documentation

## 2012-12-24 DIAGNOSIS — Z992 Dependence on renal dialysis: Secondary | ICD-10-CM | POA: Insufficient documentation

## 2012-12-24 DIAGNOSIS — Z87718 Personal history of other specified (corrected) congenital malformations of genitourinary system: Secondary | ICD-10-CM | POA: Insufficient documentation

## 2012-12-24 DIAGNOSIS — R1110 Vomiting, unspecified: Secondary | ICD-10-CM | POA: Insufficient documentation

## 2012-12-24 DIAGNOSIS — I509 Heart failure, unspecified: Secondary | ICD-10-CM | POA: Insufficient documentation

## 2012-12-24 DIAGNOSIS — Z87891 Personal history of nicotine dependence: Secondary | ICD-10-CM | POA: Insufficient documentation

## 2012-12-24 DIAGNOSIS — N186 End stage renal disease: Secondary | ICD-10-CM | POA: Insufficient documentation

## 2012-12-24 DIAGNOSIS — Z86711 Personal history of pulmonary embolism: Secondary | ICD-10-CM | POA: Insufficient documentation

## 2012-12-24 DIAGNOSIS — Z862 Personal history of diseases of the blood and blood-forming organs and certain disorders involving the immune mechanism: Secondary | ICD-10-CM | POA: Insufficient documentation

## 2012-12-24 DIAGNOSIS — Z8679 Personal history of other diseases of the circulatory system: Secondary | ICD-10-CM | POA: Insufficient documentation

## 2012-12-24 DIAGNOSIS — Z7901 Long term (current) use of anticoagulants: Secondary | ICD-10-CM | POA: Insufficient documentation

## 2012-12-24 DIAGNOSIS — R079 Chest pain, unspecified: Secondary | ICD-10-CM | POA: Insufficient documentation

## 2012-12-24 LAB — BASIC METABOLIC PANEL
BUN: 53 mg/dL — ABNORMAL HIGH (ref 6–23)
Chloride: 94 mEq/L — ABNORMAL LOW (ref 96–112)
GFR calc Af Amer: 4 mL/min — ABNORMAL LOW (ref >90)
Potassium: 5.5 mEq/L — ABNORMAL HIGH (ref 3.5–5.1)

## 2012-12-24 LAB — CBC WITH DIFFERENTIAL/PLATELET
HCT: 33.5 % — ABNORMAL LOW (ref 36.0–46.0)
Hemoglobin: 10.8 g/dL — ABNORMAL LOW (ref 12.0–15.0)
Lymphocytes Relative: 30 % (ref 12–46)
Lymphs Abs: 2.2 10*3/uL (ref 0.7–4.0)
MCHC: 32.2 g/dL (ref 30.0–36.0)
Monocytes Absolute: 0.6 10*3/uL (ref 0.1–1.0)
Monocytes Relative: 8 % (ref 3–12)
Neutro Abs: 4.6 10*3/uL (ref 1.7–7.7)
RBC: 3.54 MIL/uL — ABNORMAL LOW (ref 3.87–5.11)
WBC: 7.6 10*3/uL (ref 4.0–10.5)

## 2012-12-24 MED ORDER — OXYCODONE-ACETAMINOPHEN 5-325 MG PO TABS: 2.0000 | ORAL_TABLET | ORAL | Status: DC | PRN

## 2012-12-24 MED ORDER — OXYCODONE-ACETAMINOPHEN 5-325 MG PO TABS
1.0000 | ORAL_TABLET | Freq: Once | ORAL | Status: DC
  Filled 2012-12-24: qty 1

## 2012-12-24 NOTE — ED Provider Notes (Signed)
History     CSN: [id number]  Arrival date & time 12/24/12  1046   First MD Initiated Contact with Patient 12/24/12 1153      Chief Complaint  Patient presents with  . Chest Pain    (Consider location/radiation/quality/duration/timing/severity/associated sxs/prior treatment) HPI Comments: Pt states that she has been having these episodes over the last couple of months:pt states that this episode started tonight:pt states that she was seen in the er a couple of weeks ago and nothing was found:pt state that she has not follow up with her pcp  Patient is a 22 y.o. female presenting with chest pain. The history is provided by the patient. A language interpreter was used.  Chest Pain Pain location:  R chest Pain quality: sharp   Pain radiates to:  Does not radiate Pain radiates to the back: no   Pain severity:  Moderate Onset quality:  Gradual Timing:  Constant Progression:  Unchanged Chronicity:  New Relieved by:  Nothing Worsened by:  Nothing tried Ineffective treatments:  None tried Associated symptoms: no cough and no fever   Associated symptoms comment:  Vomiting times one Risk factors: prior DVT/PE     Past Medical History  Diagnosis Date  . Hemodialysis patient   . Hypertension   . Pulmonary emboli 01/2012    Bilateral, moderate clot burden, areas of pulmonary infarction and central necrosis  . CHF (congestive heart failure)   . Cardiomyopathy   . Dysrhythmia     at times per pt.  . Anemia   . H/O transfusion of packed red blood cells   . 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 Polycystic kidney disease     [facility]  [place], [place]    Past Surgical History  Procedure Laterality Date  . Nephrectomy    . Av fistula placement    . Kidney transplant  2008    failed  . Tonsillectomy      as a child.  . Adenoidectomy      Family History  Problem  Relation Age of Onset  . Polycystic kidney disease Father     History  Substance Use Topics  . Smoking status: Former Smoker -- 0.20 packs/day    Types: Cigarettes    Quit date: 05/06/2012  . Smokeless tobacco: Never Used  . Alcohol Use: No    OB History   Grav Para Term Preterm Abortions TAB SAB Ect Mult Living                  Review of Systems  Constitutional: Negative for fever.  Respiratory: Negative for cough.   Cardiovascular: Positive for chest pain.    Allergies  Morphine and related; Tramadol; and Vicodin  Home Medications   Current Outpatient Rx  Name  Route  Sig  Dispense  Refill  . calcium acetate (PHOSLO) 667 MG capsule   Oral   Take 1,334 mg by mouth 3 (three) times daily with meals.          . carvedilol (COREG) 3.125 MG tablet   Oral   Take 3.125 mg by mouth 2 (two) times daily with a meal.         . multivitamin (RENA-VIT) TABS tablet   Oral   Take 1 tablet by mouth at bedtime.   30 tablet   0   . oxyCODONE-acetaminophen (PERCOCET/ROXICET) 5-325 MG per tablet  Oral   Take 2 tablets by mouth every 4 (four) hours as needed for pain.   16 tablet   0   . oxyCODONE-acetaminophen (PERCOCET/ROXICET) 5-325 MG per tablet   Oral   Take 1 tablet by mouth every 8 (eight) hours as needed for pain.   15 tablet   0   . promethazine (PHENERGAN) 25 MG tablet   Oral   Take 25 mg by mouth every 6 (six) hours as needed. For nausea and vomiting          . warfarin (COUMADIN) 5 MG tablet   Oral   Take 1 tablet (5 mg total) by mouth daily.   30 tablet   0     Per INR. Take at 6:00 PM     BP 142/96  Pulse 73  Temp(Src) 98.5 F (36.9 C) (Oral)  Resp 18  Wt 150 lb (68.04 kg)  BMI 26.58 kg/m2  SpO2 100%  LMP 11/30/2012  Physical Exam  Nursing note and vitals reviewed. Constitutional: She is oriented to person, place, and time. She appears well-developed and well-nourished.  HENT:  Head: Normocephalic and atraumatic.  Eyes:  Conjunctivae and EOM are normal.  Neck: Normal range of motion. Neck supple.  Cardiovascular: Normal rate and regular rhythm.   Pulmonary/Chest: Effort normal and breath sounds normal.  Pain is not reproducible  Musculoskeletal: Normal range of motion.  Neurological: She is alert and oriented to person, place, and time.  Skin: Skin is warm and dry.    ED Course  Procedures (including critical care time)  Labs Reviewed  BASIC METABOLIC PANEL - Abnormal; Notable for the following:    Potassium 5.5 (*)    Chloride 94 (*)    BUN 53 (*)    Creatinine, Ser 14.30 (*)    Calcium 10.8 (*)    GFR calc non Af Amer 3 (*)    GFR calc Af Amer 4 (*)    All other components within normal limits  CBC WITH DIFFERENTIAL - Abnormal; Notable for the following:    RBC 3.54 (*)    Hemoglobin 10.8 (*)    HCT 33.5 (*)    All other components within normal limits   Dg Chest 2 View  12/24/2012  *RADIOLOGY REPORT*  Clinical Data: Right side chest pain, history CHF, hypertension, cardiomyopathy, pulmonary embolism  CHEST - 2 VIEW  Comparison: 12/08/2012  Findings: Upper-normal size of cardiac silhouette. Mediastinal contours and pulmonary vascularity normal. Very minimal chronic peribronchial thickening. Lungs clear. No pleural effusion or pneumothorax. Bones unremarkable.  IMPRESSION: No acute abnormalities.   Original Report Authenticated By: Ulyses Southward, M.D.     Date: 12/24/2012  Rate: 68  Rhythm: normal sinus rhythm  QRS Axis: normal  Intervals: normal  ST/T Wave abnormalities: normal  Conduction Disutrbances:first-degree A-V block   Narrative Interpretation:   Old EKG Reviewed: unchanged    1. Chest pain       MDM  Pt is okay to follow up:pt is not tachy or hypoxic don't think that pt has a pe:pt had a ct chest in the last 2 weeks that was negative:pt scheduled for dialysis tomorrow        Teressa Lower, NP 12/24/12 1322

## 2012-12-24 NOTE — ED Notes (Signed)
Sharp chest pain since last night. States she was seen here 3 weeks ago for same pain and a cause was never found. She has a hx of blood clots. Her MD has seen her since her last visit and told they would watch her.

## 2012-12-24 NOTE — ED Provider Notes (Signed)
History/physical exam/procedure(s) were performed by non-physician practitioner and as supervising physician I was immediately available for consultation/collaboration. I have reviewed all notes and am in agreement with care and plan.   Hilario Quarry, MD 12/24/12 [phone]

## 2012-12-24 NOTE — ED Notes (Signed)
Pt does not have a ride. Narcotic not given. Motrin or Tylenol was offered but refused.

## 2013-01-14 ENCOUNTER — Emergency Department (HOSPITAL_BASED_OUTPATIENT_CLINIC_OR_DEPARTMENT_OTHER): Payer: Managed Care, Other (non HMO)

## 2013-01-14 ENCOUNTER — Encounter (HOSPITAL_BASED_OUTPATIENT_CLINIC_OR_DEPARTMENT_OTHER): Payer: Self-pay | Admitting: *Deleted

## 2013-01-14 ENCOUNTER — Emergency Department: Payer: Managed Care, Other (non HMO)

## 2013-01-14 ENCOUNTER — Emergency Department (HOSPITAL_BASED_OUTPATIENT_CLINIC_OR_DEPARTMENT_OTHER)
Admission: EM | Admit: 2013-01-14 | Discharge: 2013-01-14 | Disposition: A | Discharge status: Home / Self Care | Payer: Managed Care, Other (non HMO) | Attending: Emergency Medicine | Admitting: Emergency Medicine

## 2013-01-14 DIAGNOSIS — R1084 Generalized abdominal pain: Secondary | ICD-10-CM | POA: Insufficient documentation

## 2013-01-14 DIAGNOSIS — Z8679 Personal history of other diseases of the circulatory system: Secondary | ICD-10-CM | POA: Insufficient documentation

## 2013-01-14 DIAGNOSIS — R109 Unspecified abdominal pain: Secondary | ICD-10-CM

## 2013-01-14 DIAGNOSIS — R197 Diarrhea, unspecified: Secondary | ICD-10-CM | POA: Insufficient documentation

## 2013-01-14 DIAGNOSIS — R1110 Vomiting, unspecified: Secondary | ICD-10-CM

## 2013-01-14 DIAGNOSIS — N390 Urinary tract infection, site not specified: Secondary | ICD-10-CM | POA: Insufficient documentation

## 2013-01-14 DIAGNOSIS — Z7901 Long term (current) use of anticoagulants: Secondary | ICD-10-CM | POA: Insufficient documentation

## 2013-01-14 DIAGNOSIS — Z79899 Other long term (current) drug therapy: Secondary | ICD-10-CM | POA: Insufficient documentation

## 2013-01-14 DIAGNOSIS — R509 Fever, unspecified: Secondary | ICD-10-CM | POA: Insufficient documentation

## 2013-01-14 DIAGNOSIS — I120 Hypertensive chronic kidney disease with stage 5 chronic kidney disease or end stage renal disease: Secondary | ICD-10-CM | POA: Insufficient documentation

## 2013-01-14 DIAGNOSIS — I10 Essential (primary) hypertension: Secondary | ICD-10-CM

## 2013-01-14 DIAGNOSIS — Z87448 Personal history of other diseases of urinary system: Secondary | ICD-10-CM | POA: Insufficient documentation

## 2013-01-14 DIAGNOSIS — N186 End stage renal disease: Secondary | ICD-10-CM | POA: Insufficient documentation

## 2013-01-14 DIAGNOSIS — Z3202 Encounter for pregnancy test, result negative: Secondary | ICD-10-CM | POA: Insufficient documentation

## 2013-01-14 DIAGNOSIS — Z86711 Personal history of pulmonary embolism: Secondary | ICD-10-CM | POA: Insufficient documentation

## 2013-01-14 DIAGNOSIS — I509 Heart failure, unspecified: Secondary | ICD-10-CM | POA: Insufficient documentation

## 2013-01-14 DIAGNOSIS — Z87891 Personal history of nicotine dependence: Secondary | ICD-10-CM | POA: Insufficient documentation

## 2013-01-14 DIAGNOSIS — Z862 Personal history of diseases of the blood and blood-forming organs and certain disorders involving the immune mechanism: Secondary | ICD-10-CM | POA: Insufficient documentation

## 2013-01-14 DIAGNOSIS — Z992 Dependence on renal dialysis: Secondary | ICD-10-CM | POA: Insufficient documentation

## 2013-01-14 LAB — CBC WITH DIFFERENTIAL/PLATELET
Basophils Relative: 0 % (ref 0–1)
Eosinophils Absolute: 0 10*3/uL (ref 0.0–0.7)
HCT: 33.8 % — ABNORMAL LOW (ref 36.0–46.0)
Hemoglobin: 11 g/dL — ABNORMAL LOW (ref 12.0–15.0)
Lymphs Abs: 0.9 10*3/uL (ref 0.7–4.0)
MCH: 30.4 pg (ref 26.0–34.0)
MCHC: 32.5 g/dL (ref 30.0–36.0)
MCV: 93.4 fL (ref 78.0–100.0)
Monocytes Absolute: 0.5 10*3/uL (ref 0.1–1.0)
Monocytes Relative: 6 % (ref 3–12)

## 2013-01-14 LAB — URINALYSIS, ROUTINE W REFLEX MICROSCOPIC
Bilirubin Urine: NEGATIVE
Glucose, UA: NEGATIVE mg/dL
Protein, ur: 30 mg/dL — AB
Urobilinogen, UA: 0.2 mg/dL (ref 0.0–1.0)

## 2013-01-14 LAB — BASIC METABOLIC PANEL
BUN: 58 mg/dL — ABNORMAL HIGH (ref 6–23)
CO2: 25 mEq/L (ref 19–32)
GFR calc non Af Amer: 3 mL/min — ABNORMAL LOW (ref >90)
Glucose, Bld: 76 mg/dL (ref 70–99)
Potassium: 5.6 mEq/L — ABNORMAL HIGH (ref 3.5–5.1)

## 2013-01-14 LAB — URINE MICROSCOPIC-ADD ON

## 2013-01-14 MED ORDER — DEXTROSE 5 % IV SOLN
1.0000 g | Freq: Once | INTRAVENOUS | Status: AC
  Administered 2013-01-14: 1 g via INTRAVENOUS
  Filled 2013-01-14: qty 10

## 2013-01-14 MED ORDER — HYDROMORPHONE HCL PF 1 MG/ML IJ SOLN
1.0000 mg | Freq: Once | INTRAMUSCULAR | Status: AC
  Administered 2013-01-14: 1 mg via INTRAVENOUS
  Filled 2013-01-14: qty 1

## 2013-01-14 MED ORDER — DIPHENHYDRAMINE HCL 50 MG/ML IJ SOLN
25.0000 mg | Freq: Once | INTRAMUSCULAR | Status: AC
  Administered 2013-01-14: 25 mg via INTRAVENOUS
  Filled 2013-01-14: qty 1

## 2013-01-14 MED ORDER — OXYCODONE-ACETAMINOPHEN 5-325 MG PO TABS: 1.0000 | ORAL_TABLET | ORAL | Status: DC | PRN

## 2013-01-14 MED ORDER — PROMETHAZINE HCL 25 MG/ML IJ SOLN
25.0000 mg | Freq: Once | INTRAMUSCULAR | Status: AC
  Administered 2013-01-14: 25 mg via INTRAVENOUS
  Filled 2013-01-14: qty 1

## 2013-01-14 MED ORDER — CEPHALEXIN 500 MG PO CAPS: 500.0000 mg | ORAL_CAPSULE | Freq: Two times a day (BID) | ORAL | Status: DC

## 2013-01-14 MED ORDER — CEFTRIAXONE SODIUM 1 G IJ SOLR: 1.0000 g | Freq: Once | INTRAMUSCULAR | Status: DC

## 2013-01-14 NOTE — ED Notes (Signed)
Pt amb to triage with quick steady gait in nad. Pt reports vomiting and diarrhea today and last night. Pt states she is a hemodialysis pt. At [facility].

## 2013-01-14 NOTE — ED Provider Notes (Signed)
History     CSN: [id number]  Arrival date & time 01/14/13  1424   First MD Initiated Contact with Patient 01/14/13 1447      Chief Complaint  Patient presents with  . Emesis  . Diarrhea    (Consider location/radiation/quality/duration/timing/severity/associated sxs/prior treatment) HPI Comments: Patient is a 22 y/o F with significant PMHx c/o abdominal pain, emesis, and diarrhea x 1 day. Patient reported that pain is generalized to entire abdomen, described as a sharp shooting pain that is intermittent and has been constant since this morning (01/14/2013). Patient reported using Tylenol extra strength with minimal relief. Patient reported approximately 5-10 episodes of emesis, mainly of stomach contents - denied blood. Patient reported approximately 5 episodes of diarrhea, described as loose-watery stool without blood or mucus. Patient reported one episode of hemoptysis this morning, described as brown streaks. Associated symptoms are nausea, headache, flatulence, bodyaches, subjective fever. Denied shortness of breath, difficulty breathing, chest pain, sore throat, dysuria, dysphagia, ear pain, eye pain, sick contacts, melena.  Patient is a 22 y.o. female presenting with abdominal pain. The history is provided by the patient. No language interpreter was used.  Abdominal Pain Pain location:  Generalized Pain quality: sharp   Pain radiation: all over abdomen  Pain severity:  Severe Onset quality:  Sudden Duration:  1 day Timing:  Intermittent Progression:  Worsening Chronicity:  New Context: not recent illness and not sick contacts   Relieved by:  Nothing Worsened by:  Nothing tried Ineffective treatments: Tylenol. Associated symptoms: diarrhea, fever and vomiting   Associated symptoms: no chest pain, no dysuria, no hematemesis, no hematochezia, no shortness of breath and no sore throat   Diarrhea:    Quality:  Watery   Number of occurrences:  Approximately 5 times   Severity:   Moderate   Duration:  1 day   Timing:  Intermittent   Progression:  Unchanged Vomiting:    Quality:  Undigested food and stomach contents   Number of occurrences:  Approximately 5-10 times according to patient   Severity:  Moderate   Duration:  1 day   Timing:  Intermittent   Progression:  Unchanged   Past Medical History  Diagnosis Date  . Hemodialysis patient   . Hypertension   . Pulmonary emboli 01/2012    Bilateral, moderate clot burden, areas of pulmonary infarction and central necrosis  . CHF (congestive heart failure)   . Cardiomyopathy   . Dysrhythmia     at times per pt.  . Anemia   . H/O transfusion of packed red blood cells   . 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 Polycystic kidney disease     [facility]  [place], [place]    Past Surgical History  Procedure Laterality Date  . Nephrectomy    . Av fistula placement    . Kidney transplant  2008    failed  . Tonsillectomy      as a child.  . Adenoidectomy      Family History  Problem Relation Age of Onset  . Polycystic kidney disease Father     History  Substance Use Topics  . Smoking status: Former Smoker -- 0.20 packs/day    Types: Cigarettes    Quit date: 05/06/2012  . Smokeless tobacco: Never Used  . Alcohol Use: No    OB History   Grav Para Term Preterm Abortions TAB SAB Ect Mult  Living                  Review of Systems  Constitutional: Positive for fever.  HENT: Negative for ear pain, sore throat and trouble swallowing.   Eyes: Negative for pain.  Respiratory: Negative for shortness of breath.   Cardiovascular: Negative for chest pain.  Gastrointestinal: Positive for vomiting, abdominal pain and diarrhea. Negative for hematochezia and hematemesis.  Genitourinary: Negative for dysuria.  10 Systems reviewed and are negative for acute change except as noted in the  HPI.   Allergies  Morphine and related; Tramadol; and Vicodin  Home Medications   Current Outpatient Rx  Name  Route  Sig  Dispense  Refill  . calcium acetate (PHOSLO) 667 MG capsule   Oral   Take 1,334 mg by mouth 3 (three) times daily with meals.          . carvedilol (COREG) 3.125 MG tablet   Oral   Take 3.125 mg by mouth 2 (two) times daily with a meal.         . cephALEXin (KEFLEX) 500 MG capsule   Oral   Take 1 capsule (500 mg total) by mouth 2 (two) times daily.   14 capsule   0   . multivitamin (RENA-VIT) TABS tablet   Oral   Take 1 tablet by mouth at bedtime.   30 tablet   0   . oxyCODONE-acetaminophen (PERCOCET/ROXICET) 5-325 MG per tablet   Oral   Take 2 tablets by mouth every 4 (four) hours as needed for pain.   16 tablet   0   . oxyCODONE-acetaminophen (PERCOCET/ROXICET) 5-325 MG per tablet   Oral   Take 1 tablet by mouth every 8 (eight) hours as needed for pain.   15 tablet   0   . oxyCODONE-acetaminophen (PERCOCET/ROXICET) 5-325 MG per tablet   Oral   Take 2 tablets by mouth every 4 (four) hours as needed for pain.   6 tablet   0   . oxyCODONE-acetaminophen (PERCOCET/ROXICET) 5-325 MG per tablet   Oral   Take 1 tablet by mouth every 4 (four) hours as needed for pain.   6 tablet   0   . promethazine (PHENERGAN) 25 MG tablet   Oral   Take 25 mg by mouth every 6 (six) hours as needed. For nausea and vomiting          . warfarin (COUMADIN) 5 MG tablet   Oral   Take 1 tablet (5 mg total) by mouth daily.   30 tablet   0     Per INR. Take at 6:00 PM     BP 137/75  Pulse 94  Temp(Src) 99.4 F (37.4 C) (Oral)  Resp 16  SpO2 100%  LMP 10/23/2012  Physical Exam  Nursing note and vitals reviewed. Constitutional: She is oriented to person, place, and time. She appears well-developed and well-nourished. No distress.  HENT:  Head: Normocephalic.  Right Ear: External ear normal.  Left Ear: External ear normal.  Nose: Nose  normal.  Mouth/Throat: Oropharynx is clear and moist. No oropharyngeal exudate.  Eyes: Conjunctivae and EOM are normal. Pupils are equal, round, and reactive to light. Right eye exhibits no discharge. Left eye exhibits no discharge.  Neck: Normal range of motion. Neck supple. No tracheal deviation present.  Cardiovascular: Normal rate, regular rhythm, normal heart sounds and intact distal pulses.  Exam reveals no friction rub.   No murmur heard. Pulmonary/Chest: Effort normal and breath sounds  normal. No respiratory distress. She has no wheezes. She has no rales.  Abdominal: Soft. Bowel sounds are normal. She exhibits no distension. There is tenderness. There is no rebound and no guarding.  Tenderness upon palpation to all 4 quadrants and epigastrum Positive Murphy's sign Negative Psoas Negative Obturator  Lymphadenopathy:    She has no cervical adenopathy.  Neurological: She is alert and oriented to person, place, and time.  Skin: Skin is warm and dry. No rash noted. She is not diaphoretic. No erythema.  Psychiatric: She has a normal mood and affect. Her behavior is normal.    ED Course  Procedures (including critical care time)  Labs Reviewed  CBC WITH DIFFERENTIAL - Abnormal; Notable for the following:    RBC 3.62 (*)    Hemoglobin 11.0 (*)    HCT 33.8 (*)    Neutrophils Relative 81 (*)    All other components within normal limits  URINALYSIS, ROUTINE W REFLEX MICROSCOPIC - Abnormal; Notable for the following:    APPearance CLOUDY (*)    Hgb urine dipstick SMALL (*)    Protein, ur 30 (*)    Leukocytes, UA MODERATE (*)    All other components within normal limits  BASIC METABOLIC PANEL - Abnormal; Notable for the following:    Potassium 5.6 (*)    Chloride 92 (*)    BUN 58 (*)    Creatinine, Ser 15.40 (*)    Calcium 10.7 (*)    GFR calc non Af Amer 3 (*)    GFR calc Af Amer 3 (*)    All other components within normal limits  URINE MICROSCOPIC-ADD ON - Abnormal; Notable  for the following:    Squamous Epithelial / LPF FEW (*)    Bacteria, UA FEW (*)    All other components within normal limits  URINE CULTURE  PREGNANCY, URINE  LIPASE, BLOOD   US Abdomen Complete  01/14/2013  *RADIOLOGY REPORT*  Clinical Data:  Nausea, vomiting, hypertension, dialysis patient, polycystic kidney disease  ABDOMINAL ULTRASOUND COMPLETE  Comparison:  02/26/2012  Findings:  Gallbladder:  No gallstones, gallbladder wall thickening, or pericholecystic fluid.  Common Bile Duct:  Within normal limits in caliber.  Liver: No focal mass lesion identified.  Within normal limits in parenchymal echogenicity.  IVC:  Limited visualization but no gross abnormality  Pancreas:  Limited visualization but no gross abnormality  Spleen:  Within normal limits in size and echotexture.  Right kidney:  9.6 cm length.  Diffuse increase echogenicity and cortical thinning.  Numerous right renal cysts evident.  No gross hydronephrosis.  Dominant cyst in the right upper pole measures 2.7 cm.  Left kidney:  10.2 cm length.  Diffuse increase echogenicity and cortical thinning.  Numerous left renal cysts.  Dominant left renal cyst in the mid pole measures 3 cm.  No gross hydronephrosis or obstruction.  Abdominal Aorta:  No aneurysm identified.  IMPRESSION: Chronic-appearing polycystic kidneys.  Otherwise no acute finding by ultrasound.   Original Report Authenticated By: Judie Petit. Miles Costain, M.D.    Filed Vitals:   01/14/13 1752  BP: 137/75  Pulse: 94  Temp:   Resp: 16     1. UTI (lower urinary tract infection)   2. Abdominal pain   3. Emesis   4. End stage renal disease on dialysis   5. HTN (hypertension)       MDM  Patient is a 22 y/o F with significant PMHx c/o abdominal pain, nausea, vomiting, and diarrhea x 1 day. Denied shortness  of breathe, difficulty breathing, chest pain, sick contacts, melena.   I personally evaluated and examined the patient. Discussed case with Dr. Freida Busman PE: Alert, oriented, and calm  affect. Abdomen: non-distended, BS normoactive, soft, pain upon palpation to all 4 quadrants and epigastrum, positive Murhpy's sign.  Ordered CBC, BMP, Lipase, UA, Urine culture, Pregnancy test, and US Abdomen  Promethazine for nausea/vomiting Dilaudid for pain  Benadryl given  CBC: low RBC (3.62), Hgb low (11.0), Hct (33.8) - consistent findings from previous lab results that have been reviewed. No high WBC.  BMP: high potassium (5.6), high chloride (92), BUN high (58), Creatinine high (15.40) - blood hemolyzed, patient on hemodialysis - last session on Saturday (01/11/2013) UA: small Hgb, moderate leukocytes, protein 30; negative ketones, bilirubin, negative nitrites - consistent findings with labs when reviewed.  UA microscopic: few bacteria, high WBC 21-50 US Abdomen: No gallstones, no gallbladder wall thickening, or pericholecystic fluid. Chronic-appearing polycystic kidneys. No acute findings.  Pregnancy, urine: negative Lipase: negative (59).  Administered 1 gram ceftriaxone IV Patient afebrile, normotensive, non-tachycardic, alert, and improved pain. Discharge patient with suspected UTI. Discharged patient with Keflex for infection and Percocet for pain. Discussed with patient to follow-up with Dr. Gwendolyn Fill within the week regarding condition and visit. Discussed with patient to not drive, drink alcohol, operate any heavy machinery while on pain medications. Discussed with patient to please continue to take home medication as prescribed. Discussed with patient that if symptoms are to worsen please report back to the ED. Patient agreed to plan of care, understood, all questions answered.     Raymon Mutton, PA-C 01/14/13 2224

## 2013-01-15 LAB — URINE CULTURE

## 2013-01-15 NOTE — ED Provider Notes (Signed)
Urine culture results reviewed - negative findings. 20,000 colonies identified, no special requests recommended.   Raymon Mutton, PA-C 01/15/13 1706

## 2013-01-17 NOTE — ED Provider Notes (Signed)
Medical screening examination/treatment/procedure(s) were performed by non-physician practitioner and as supervising physician I was immediately available for consultation/collaboration.  Toy Baker, MD 01/17/13 [phone]

## 2013-01-17 NOTE — ED Provider Notes (Signed)
Medical screening examination/treatment/procedure(s) were performed by non-physician practitioner and as supervising physician I was immediately available for consultation/collaboration.  Seva Chancy T Chanise Habeck, MD 01/17/13 0735 

## 2013-01-20 ENCOUNTER — Emergency Department (HOSPITAL_BASED_OUTPATIENT_CLINIC_OR_DEPARTMENT_OTHER): Payer: Managed Care, Other (non HMO)

## 2013-01-20 ENCOUNTER — Emergency Department (HOSPITAL_BASED_OUTPATIENT_CLINIC_OR_DEPARTMENT_OTHER)
Admission: EM | Admit: 2013-01-20 | Discharge: 2013-01-21 | Disposition: A | Discharge status: Home / Self Care | Payer: Managed Care, Other (non HMO) | Attending: Emergency Medicine | Admitting: Emergency Medicine

## 2013-01-20 ENCOUNTER — Encounter (HOSPITAL_BASED_OUTPATIENT_CLINIC_OR_DEPARTMENT_OTHER): Payer: Self-pay | Admitting: *Deleted

## 2013-01-20 DIAGNOSIS — Z86711 Personal history of pulmonary embolism: Secondary | ICD-10-CM | POA: Insufficient documentation

## 2013-01-20 DIAGNOSIS — S40029A Contusion of unspecified upper arm, initial encounter: Secondary | ICD-10-CM | POA: Insufficient documentation

## 2013-01-20 DIAGNOSIS — R1110 Vomiting, unspecified: Secondary | ICD-10-CM | POA: Insufficient documentation

## 2013-01-20 DIAGNOSIS — S0990XA Unspecified injury of head, initial encounter: Secondary | ICD-10-CM | POA: Diagnosis present

## 2013-01-20 DIAGNOSIS — I509 Heart failure, unspecified: Secondary | ICD-10-CM | POA: Insufficient documentation

## 2013-01-20 DIAGNOSIS — I120 Hypertensive chronic kidney disease with stage 5 chronic kidney disease or end stage renal disease: Secondary | ICD-10-CM | POA: Diagnosis not present

## 2013-01-20 DIAGNOSIS — Z79899 Other long term (current) drug therapy: Secondary | ICD-10-CM | POA: Insufficient documentation

## 2013-01-20 DIAGNOSIS — Z87891 Personal history of nicotine dependence: Secondary | ICD-10-CM | POA: Diagnosis not present

## 2013-01-20 DIAGNOSIS — N186 End stage renal disease: Secondary | ICD-10-CM | POA: Insufficient documentation

## 2013-01-20 DIAGNOSIS — Z87448 Personal history of other diseases of urinary system: Secondary | ICD-10-CM | POA: Insufficient documentation

## 2013-01-20 DIAGNOSIS — Z862 Personal history of diseases of the blood and blood-forming organs and certain disorders involving the immune mechanism: Secondary | ICD-10-CM | POA: Insufficient documentation

## 2013-01-20 DIAGNOSIS — Z992 Dependence on renal dialysis: Secondary | ICD-10-CM | POA: Diagnosis not present

## 2013-01-20 DIAGNOSIS — S0003XA Contusion of scalp, initial encounter: Secondary | ICD-10-CM | POA: Insufficient documentation

## 2013-01-20 DIAGNOSIS — Z8679 Personal history of other diseases of the circulatory system: Secondary | ICD-10-CM | POA: Diagnosis not present

## 2013-01-20 DIAGNOSIS — Z7901 Long term (current) use of anticoagulants: Secondary | ICD-10-CM | POA: Diagnosis not present

## 2013-01-20 DIAGNOSIS — S40022A Contusion of left upper arm, initial encounter: Secondary | ICD-10-CM

## 2013-01-20 DIAGNOSIS — S0083XA Contusion of other part of head, initial encounter: Secondary | ICD-10-CM

## 2013-01-20 LAB — BASIC METABOLIC PANEL
BUN: 77 mg/dL — ABNORMAL HIGH (ref 6–23)
CO2: 20 mEq/L (ref 19–32)
Chloride: 92 mEq/L — ABNORMAL LOW (ref 96–112)
GFR calc non Af Amer: 2 mL/min — ABNORMAL LOW (ref >90)
Glucose, Bld: 94 mg/dL (ref 70–99)
Potassium: 5 mEq/L (ref 3.5–5.1)
Sodium: 138 mEq/L (ref 135–145)

## 2013-01-20 MED ORDER — ONDANSETRON 4 MG PO TBDP
4.0000 mg | ORAL_TABLET | Freq: Once | ORAL | Status: AC
  Administered 2013-01-20: 4 mg via ORAL
  Filled 2013-01-20: qty 1

## 2013-01-20 MED ORDER — DIPHENHYDRAMINE HCL 25 MG PO CAPS: 25.0000 mg | ORAL_CAPSULE | Freq: Once | ORAL | Status: AC

## 2013-01-20 MED ORDER — HYDROMORPHONE HCL PF 1 MG/ML IJ SOLN
1.0000 mg | Freq: Once | INTRAMUSCULAR | Status: AC
  Administered 2013-01-20: 1 mg via INTRAMUSCULAR
  Filled 2013-01-20: qty 1

## 2013-01-20 MED ORDER — DIPHENHYDRAMINE HCL 50 MG/ML IJ SOLN
INTRAMUSCULAR | Status: AC
  Filled 2013-01-20: qty 1

## 2013-01-20 MED ORDER — DIPHENHYDRAMINE HCL 25 MG PO CAPS
ORAL_CAPSULE | ORAL | Status: AC
  Administered 2013-01-20: 25 mg via ORAL
  Filled 2013-01-20: qty 1
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
## 2013-01-20 NOTE — ED Notes (Signed)
Pt. Reports to RN she does not plan to go to dialysis any longer.  Pt. Here on last wk and had not gone to dialysis as well.

## 2013-01-20 NOTE — ED Notes (Signed)
MD at bedside. 
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
## 2013-01-20 NOTE — Progress Notes (Signed)
Performed arterial stick to obtain necessary lab work.

## 2013-01-21 MED ORDER — OXYCODONE-ACETAMINOPHEN 5-325 MG PO TABS: 1.0000 | ORAL_TABLET | Freq: Four times a day (QID) | ORAL | Status: DC | PRN

## 2013-02-18 ENCOUNTER — Emergency Department (HOSPITAL_BASED_OUTPATIENT_CLINIC_OR_DEPARTMENT_OTHER): Payer: Managed Care, Other (non HMO)

## 2013-02-18 ENCOUNTER — Emergency Department (HOSPITAL_BASED_OUTPATIENT_CLINIC_OR_DEPARTMENT_OTHER)
Admission: EM | Admit: 2013-02-18 | Discharge: 2013-02-18 | Disposition: A | Discharge status: Home / Self Care | Payer: Managed Care, Other (non HMO) | Attending: Emergency Medicine | Admitting: Emergency Medicine

## 2013-02-18 ENCOUNTER — Encounter (HOSPITAL_BASED_OUTPATIENT_CLINIC_OR_DEPARTMENT_OTHER): Payer: Self-pay | Admitting: *Deleted

## 2013-02-18 DIAGNOSIS — Z905 Acquired absence of kidney: Secondary | ICD-10-CM | POA: Insufficient documentation

## 2013-02-18 DIAGNOSIS — I120 Hypertensive chronic kidney disease with stage 5 chronic kidney disease or end stage renal disease: Secondary | ICD-10-CM | POA: Insufficient documentation

## 2013-02-18 DIAGNOSIS — R51 Headache: Secondary | ICD-10-CM

## 2013-02-18 DIAGNOSIS — I509 Heart failure, unspecified: Secondary | ICD-10-CM | POA: Insufficient documentation

## 2013-02-18 DIAGNOSIS — Z86711 Personal history of pulmonary embolism: Secondary | ICD-10-CM | POA: Insufficient documentation

## 2013-02-18 DIAGNOSIS — Z79899 Other long term (current) drug therapy: Secondary | ICD-10-CM | POA: Insufficient documentation

## 2013-02-18 DIAGNOSIS — N186 End stage renal disease: Secondary | ICD-10-CM | POA: Insufficient documentation

## 2013-02-18 DIAGNOSIS — J3489 Other specified disorders of nose and nasal sinuses: Secondary | ICD-10-CM | POA: Insufficient documentation

## 2013-02-18 DIAGNOSIS — Z8742 Personal history of other diseases of the female genital tract: Secondary | ICD-10-CM | POA: Insufficient documentation

## 2013-02-18 DIAGNOSIS — Z87891 Personal history of nicotine dependence: Secondary | ICD-10-CM | POA: Insufficient documentation

## 2013-02-18 DIAGNOSIS — Z862 Personal history of diseases of the blood and blood-forming organs and certain disorders involving the immune mechanism: Secondary | ICD-10-CM | POA: Insufficient documentation

## 2013-02-18 DIAGNOSIS — R071 Chest pain on breathing: Secondary | ICD-10-CM | POA: Insufficient documentation

## 2013-02-18 DIAGNOSIS — Z8679 Personal history of other diseases of the circulatory system: Secondary | ICD-10-CM | POA: Insufficient documentation

## 2013-02-18 DIAGNOSIS — Z992 Dependence on renal dialysis: Secondary | ICD-10-CM | POA: Insufficient documentation

## 2013-02-18 DIAGNOSIS — R0781 Pleurodynia: Secondary | ICD-10-CM

## 2013-02-18 MED ORDER — OXYCODONE-ACETAMINOPHEN 5-325 MG PO TABS
1.0000 | ORAL_TABLET | Freq: Once | ORAL | Status: AC
  Administered 2013-02-18: 1 via ORAL
  Filled 2013-02-18 (×2): qty 1

## 2013-02-18 MED ORDER — HYDRALAZINE HCL 25 MG PO TABS
25.0000 mg | ORAL_TABLET | Freq: Once | ORAL | Status: DC
  Filled 2013-02-18: qty 1

## 2013-02-18 MED ORDER — ONDANSETRON 4 MG PO TBDP: 4.0000 mg | ORAL_TABLET | Freq: Three times a day (TID) | ORAL | Status: DC | PRN

## 2013-02-18 MED ORDER — CLONIDINE HCL 0.1 MG PO TABS
0.1000 mg | ORAL_TABLET | Freq: Once | ORAL | Status: AC
  Administered 2013-02-18: 0.1 mg via ORAL
  Filled 2013-02-18: qty 1

## 2013-02-18 NOTE — ED Notes (Addendum)
Rx x 1 given for zofran. EDP Wickline notified of pt's b/p at d/c and okay to d/c home

## 2013-02-18 NOTE — ED Notes (Signed)
Pt refused the percocet,stated she wanted a shot,didn't like pills.  Spoke with Dr.Wickline,order was not changed.  Pt still refused .  Pill was crushed and placed in sink/washed down drain

## 2013-02-18 NOTE — ED Notes (Signed)
Unsuccessful attempt to draw blood x 6 attempts in right arm.  Dr. Bebe Shaggy updated.  Hold labs at this time.

## 2013-02-18 NOTE — ED Provider Notes (Signed)
History     CSN: [id number]  Arrival date & time 02/18/13  1614   First MD Initiated Contact with Patient 02/18/13 1641      Chief Complaint  Patient presents with  . Headache    (Consider location/radiation/quality/duration/timing/severity/associated sxs/prior treatment) Patient is a 22 y.o. female presenting with headaches.  Headache Associated symptoms: congestion   Associated symptoms: no abdominal pain, no back pain and no fever     22 year old female with Polycystic Kidney disease, ESRD, Hx of PE who presents with 2 days of headache and chest pain that started this morning.  She says that she has headaches sometimes and she took some extra strength tylenol which usually makes her headaches better, but this time it did not get better.  She says she woke up with some chest pain, and called her doctor who told her if it continued she should go to the ER.  She says she did not feel good so she did not go to dialysis today.    Past Medical History  Diagnosis Date  . Hemodialysis patient   . Hypertension   . Pulmonary emboli 01/2012    Bilateral, moderate clot burden, areas of pulmonary infarction and central necrosis  . CHF (congestive heart failure)   . Cardiomyopathy   . Dysrhythmia     at times per pt.  . Anemia   . H/O transfusion of packed red blood cells   . 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 Polycystic kidney disease     [facility]  [place], [place]    Past Surgical History  Procedure Laterality Date  . Nephrectomy    . Av fistula placement    . Kidney transplant  2008    failed  . Tonsillectomy      as a child.  . Adenoidectomy      Family History  Problem Relation Age of Onset  . Polycystic kidney disease Father     History  Substance Use Topics  . Smoking status: Former Smoker -- 0.20 packs/day    Types: Cigarettes    Quit date:  05/06/2012  . Smokeless tobacco: Never Used  . Alcohol Use: No   Review of Systems  Constitutional: Negative for fever.  HENT: Positive for congestion.   Eyes: Negative for visual disturbance.  Respiratory: Negative for shortness of breath.   Cardiovascular: Positive for chest pain.  Gastrointestinal: Negative for abdominal pain.  Genitourinary: Negative for dysuria.  Musculoskeletal: Negative for back pain.  Skin: Negative for rash.  Neurological: Positive for headaches. Negative for syncope.    Allergies  Morphine and related; Tramadol; and Vicodin  Home Medications   Current Outpatient Rx  Name  Route  Sig  Dispense  Refill  . calcium acetate (PHOSLO) 667 MG capsule   Oral   Take 1,334 mg by mouth 3 (three) times daily with meals.          . carvedilol (COREG) 3.125 MG tablet   Oral   Take 3.125 mg by mouth 2 (two) times daily with a meal.         . cephALEXin (KEFLEX) 500 MG capsule   Oral   Take 1 capsule (500 mg total) by mouth 2 (two) times daily.   14 capsule   0   . multivitamin (RENA-VIT) TABS tablet   Oral   Take 1 tablet by mouth at bedtime.  30 tablet   0   . oxyCODONE-acetaminophen (PERCOCET/ROXICET) 5-325 MG per tablet   Oral   Take 2 tablets by mouth every 4 (four) hours as needed for pain.   16 tablet   0   . oxyCODONE-acetaminophen (PERCOCET/ROXICET) 5-325 MG per tablet   Oral   Take 1 tablet by mouth every 8 (eight) hours as needed for pain.   15 tablet   0   . oxyCODONE-acetaminophen (PERCOCET/ROXICET) 5-325 MG per tablet   Oral   Take 2 tablets by mouth every 4 (four) hours as needed for pain.   6 tablet   0   . oxyCODONE-acetaminophen (PERCOCET/ROXICET) 5-325 MG per tablet   Oral   Take 1 tablet by mouth every 4 (four) hours as needed for pain.   6 tablet   0   . oxyCODONE-acetaminophen (PERCOCET/ROXICET) 5-325 MG per tablet   Oral   Take 1 tablet by mouth every 6 (six) hours as needed for pain.   10 tablet   0   .  promethazine (PHENERGAN) 25 MG tablet   Oral   Take 25 mg by mouth every 6 (six) hours as needed. For nausea and vomiting          . warfarin (COUMADIN) 5 MG tablet   Oral   Take 1 tablet (5 mg total) by mouth daily.   30 tablet   0     Per INR. Take at 6:00 PM     BP 163/106  Pulse 83  Temp(Src) 98.6 F (37 C) (Oral)  Resp 20  Wt 150 lb (68.04 kg)  BMI 26.58 kg/m2  SpO2 99%  Physical Exam  Constitutional: She is oriented to person, place, and time. She appears well-developed.  HENT:  Head: Normocephalic and atraumatic.  Mouth/Throat: Oropharynx is clear and moist.  Eyes: EOM are normal. Pupils are equal, round, and reactive to light.  Neck: Normal range of motion. Neck supple.  Cardiovascular: Normal rate, regular rhythm and normal heart sounds.   Pulmonary/Chest: Effort normal and breath sounds normal. She exhibits no tenderness.  Abdominal: Soft. Bowel sounds are normal. There is no tenderness.  Musculoskeletal: She exhibits no edema.  Neurological: She is alert and oriented to person, place, and time. No cranial nerve deficit. Coordination normal.  Skin: No rash noted.    ED Course  Procedures (including critical care time)  Labs Reviewed - No data to display Dg Chest 2 View  02/18/2013  *RADIOLOGY REPORT*  Clinical Data: Anterior chest pain  CHEST - 2 VIEW  Comparison:  December 24, 2012  Findings: Lungs are clear.  Heart is borderline prominent with normal pulmonary vascularity.  No adenopathy.  No pneumothorax.  No bone lesions.  IMPRESSION: Heart borderline prominent.  No edema or consolidation.   Original Report Authenticated By: Bretta Bang, M.D.     Date: 02/18/2013  Rate: 74  Rhythm: normal sinus rhythm  QRS Axis: normal  Intervals: normal  ST/T Wave abnormalities: normal  Conduction Disutrbances:first-degree A-V block   Narrative Interpretation: NSR with 1st degree AV block  Old EKG Reviewed: unchanged     1. Headache   2. Pleuritic chest  pain       MDM  Patient with normal neurological exam, EKG normal and unchanged from previous.  We attempted to check labs, patient was stuck 6 times and refused further attempts.  I discussed with her that she needs to call her kidney doctor and/or HD center tomorrow morning since she missed dialysis today.  Ardyth Gal, MD 02/18/13 [phone]

## 2013-02-18 NOTE — ED Provider Notes (Signed)
I have personally seen and examined the patient.  I have discussed the plan of care with the resident.  I have reviewed the documentation on PMH/FH/Soc. History.  I have reviewed the documentation of the resident and agree.  I have reviewed and agree with the ECG interpretation(s) documented by the resident.   Joya Gaskins, MD 02/18/13 2108

## 2013-02-18 NOTE — ED Notes (Addendum)
Headache since last night. Chest pain with cough woke her this am. She did not go to dialysis today.

## 2013-02-18 NOTE — ED Notes (Signed)
Pt states she has had a headache for the last 24 hours and did not go to dialysis today because of her headache.  Stated that she has missed more days than this and did not have any problems with her dialysis in the past.  Pt states she has had chest pain for months and it just keeps coming back.  States he doctor is not doing anything about it.

## 2013-02-18 NOTE — ED Provider Notes (Signed)
Patient seen/examined in the Emergency Department in conjunction with [facility]ident Phys[facility] Patient reports Headache and CP Exam : awake/alert .  No arm or leg drift and no facial droop.  No neuro deficits noted Plan: plan is to check CXR.  Pt reports she has recurrent CP on a monthly basis.  Clinically does not have acute PE.  I doubt ACS or Aortic dissection.  For her HA, no neuro deficits, denied sudden onset, doubt acute neurologic event.  Advised need for dialysis tomorrow    [name] Gaskins, MD 02/18/13 [phone]

## 2013-03-20 ENCOUNTER — Encounter (HOSPITAL_BASED_OUTPATIENT_CLINIC_OR_DEPARTMENT_OTHER): Payer: Self-pay | Admitting: *Deleted

## 2013-03-20 ENCOUNTER — Inpatient Hospital Stay (HOSPITAL_BASED_OUTPATIENT_CLINIC_OR_DEPARTMENT_OTHER)
Admission: EM | Admit: 2013-03-20 | Discharge: 2013-03-24 | DRG: 579 | Disposition: A | Discharge status: Home / Self Care | Payer: Managed Care, Other (non HMO) | Attending: Internal Medicine | Admitting: Internal Medicine

## 2013-03-20 DIAGNOSIS — I120 Hypertensive chronic kidney disease with stage 5 chronic kidney disease or end stage renal disease: Secondary | ICD-10-CM | POA: Diagnosis present

## 2013-03-20 DIAGNOSIS — L0201 Cutaneous abscess of face: Secondary | ICD-10-CM | POA: Diagnosis present

## 2013-03-20 DIAGNOSIS — I5022 Chronic systolic (congestive) heart failure: Secondary | ICD-10-CM

## 2013-03-20 DIAGNOSIS — Z87891 Personal history of nicotine dependence: Secondary | ICD-10-CM

## 2013-03-20 DIAGNOSIS — X58XXXA Exposure to other specified factors, initial encounter: Secondary | ICD-10-CM

## 2013-03-20 DIAGNOSIS — Z992 Dependence on renal dialysis: Secondary | ICD-10-CM

## 2013-03-20 DIAGNOSIS — S1095XA Superficial foreign body of unspecified part of neck, initial encounter: Principal | ICD-10-CM | POA: Diagnosis present

## 2013-03-20 DIAGNOSIS — N2581 Secondary hyperparathyroidism of renal origin: Secondary | ICD-10-CM | POA: Diagnosis present

## 2013-03-20 DIAGNOSIS — I10 Essential (primary) hypertension: Secondary | ICD-10-CM

## 2013-03-20 DIAGNOSIS — D631 Anemia in chronic kidney disease: Secondary | ICD-10-CM | POA: Diagnosis present

## 2013-03-20 DIAGNOSIS — I509 Heart failure, unspecified: Secondary | ICD-10-CM | POA: Diagnosis present

## 2013-03-20 DIAGNOSIS — J189 Pneumonia, unspecified organism: Secondary | ICD-10-CM

## 2013-03-20 DIAGNOSIS — N039 Chronic nephritic syndrome with unspecified morphologic changes: Secondary | ICD-10-CM | POA: Diagnosis present

## 2013-03-20 DIAGNOSIS — D649 Anemia, unspecified: Secondary | ICD-10-CM

## 2013-03-20 DIAGNOSIS — Z86711 Personal history of pulmonary embolism: Secondary | ICD-10-CM

## 2013-03-20 DIAGNOSIS — R12 Heartburn: Secondary | ICD-10-CM | POA: Diagnosis not present

## 2013-03-20 DIAGNOSIS — N186 End stage renal disease: Secondary | ICD-10-CM | POA: Diagnosis present

## 2013-03-20 DIAGNOSIS — L03211 Cellulitis of face: Secondary | ICD-10-CM | POA: Diagnosis present

## 2013-03-20 DIAGNOSIS — R0781 Pleurodynia: Secondary | ICD-10-CM

## 2013-03-20 DIAGNOSIS — IMO0002 Reserved for concepts with insufficient information to code with codable children: Secondary | ICD-10-CM | POA: Diagnosis present

## 2013-03-20 DIAGNOSIS — I2699 Other pulmonary embolism without acute cor pulmonale: Secondary | ICD-10-CM

## 2013-03-20 DIAGNOSIS — Y998 Other external cause status: Secondary | ICD-10-CM

## 2013-03-20 DIAGNOSIS — I428 Other cardiomyopathies: Secondary | ICD-10-CM | POA: Diagnosis present

## 2013-03-20 DIAGNOSIS — K122 Cellulitis and abscess of mouth: Secondary | ICD-10-CM | POA: Diagnosis present

## 2013-03-20 DIAGNOSIS — L089 Local infection of the skin and subcutaneous tissue, unspecified: Principal | ICD-10-CM | POA: Diagnosis present

## 2013-03-20 DIAGNOSIS — D696 Thrombocytopenia, unspecified: Secondary | ICD-10-CM

## 2013-03-20 DIAGNOSIS — Z91199 Patient's noncompliance with other medical treatment and regimen due to unspecified reason: Secondary | ICD-10-CM

## 2013-03-20 DIAGNOSIS — Z9119 Patient's noncompliance with other medical treatment and regimen: Secondary | ICD-10-CM

## 2013-03-20 DIAGNOSIS — Q613 Polycystic kidney, unspecified: Secondary | ICD-10-CM

## 2013-03-20 HISTORY — DX: Cutaneous abscess of face: L02.01

## 2013-03-20 HISTORY — DX: Cellulitis of face: L03.211

## 2013-03-20 LAB — COMPREHENSIVE METABOLIC PANEL
ALT: 11 U/L (ref 0–35)
Calcium: 10.3 mg/dL (ref 8.4–10.5)
GFR calc Af Amer: 6 mL/min — ABNORMAL LOW (ref >90)
Glucose, Bld: 79 mg/dL (ref 70–99)
Sodium: 141 mEq/L (ref 135–145)
Total Protein: 8.2 g/dL (ref 6.0–8.3)

## 2013-03-20 LAB — CBC WITH DIFFERENTIAL/PLATELET
Basophils Relative: 0 % (ref 0–1)
Eosinophils Absolute: 0 10*3/uL (ref 0.0–0.7)
Eosinophils Relative: 0 % (ref 0–5)
Lymphs Abs: 1.1 10*3/uL (ref 0.7–4.0)
MCH: 29.6 pg (ref 26.0–34.0)
MCHC: 32.9 g/dL (ref 30.0–36.0)
MCV: 90 fL (ref 78.0–100.0)
Platelets: 220 10*3/uL (ref 150–400)
RBC: 2.8 MIL/uL — ABNORMAL LOW (ref 3.87–5.11)
RDW: 12.9 % (ref 11.5–15.5)

## 2013-03-20 MED ORDER — HYDROMORPHONE HCL PF 1 MG/ML IJ SOLN
INTRAMUSCULAR | Status: AC
  Filled 2013-03-20: qty 1

## 2013-03-20 MED ORDER — HYDROMORPHONE HCL PF 1 MG/ML IJ SOLN
1.0000 mg | Freq: Once | INTRAMUSCULAR | Status: DC
  Administered 2013-03-20: 1 mg via INTRAVENOUS
  Filled 2013-03-20: qty 1

## 2013-03-20 MED ORDER — DIPHENHYDRAMINE HCL 50 MG/ML IJ SOLN
25.0000 mg | Freq: Once | INTRAMUSCULAR | Status: AC
  Administered 2013-03-20: 25 mg via INTRAVENOUS
  Filled 2013-03-20: qty 1

## 2013-03-20 MED ORDER — ONDANSETRON HCL 4 MG/2ML IJ SOLN
4.0000 mg | Freq: Once | INTRAMUSCULAR | Status: AC
  Administered 2013-03-20: 4 mg via INTRAVENOUS
  Filled 2013-03-20: qty 2

## 2013-03-20 MED ORDER — VANCOMYCIN HCL IN DEXTROSE 1-5 GM/200ML-% IV SOLN
1000.0000 mg | Freq: Once | INTRAVENOUS | Status: AC
  Administered 2013-03-20: 1000 mg via INTRAVENOUS
  Filled 2013-03-20: qty 200

## 2013-03-20 MED ORDER — HYDROMORPHONE HCL PF 1 MG/ML IJ SOLN
1.0000 mg | Freq: Once | INTRAMUSCULAR | Status: AC
  Administered 2013-03-20: 1 mg via INTRAVENOUS

## 2013-03-20 MED ORDER — HYDROMORPHONE HCL PF 1 MG/ML IJ SOLN: 1.0000 mg | Freq: Once | INTRAMUSCULAR | Status: DC

## 2013-03-20 MED ORDER — SODIUM CHLORIDE 0.9 % IV SOLN
Freq: Once | INTRAVENOUS | Status: AC
  Administered 2013-03-20: 20:00:00 via INTRAVENOUS

## 2013-03-20 MED ORDER — HYDROMORPHONE HCL PF 1 MG/ML IJ SOLN
1.0000 mg | Freq: Once | INTRAMUSCULAR | Status: AC
  Administered 2013-03-21: 1 mg via INTRAVENOUS
  Filled 2013-03-20: qty 1

## 2013-03-20 NOTE — ED Provider Notes (Signed)
History     CSN: [id number]  Arrival date & time 03/20/13  7829   First MD Initiated Contact with Patient 03/20/13 1825      Chief Complaint  Patient presents with  . facial piercing infection     (Consider location/radiation/quality/duration/timing/severity/associated sxs/prior treatment) The history is provided by the patient. No language interpreter was used.  Patient presents to the ED for evaluation of swelling to right cheek, onset two days ago.  Had dialysis today, was advised to report to ED for evaluation.  Patient reports associated fever, difficulty swallowing.  Is unable to fully open mouth due to swelling and pain.  Patient has a facial piercing in the same area (inserted 4 months ago), but states she is unable to unscrew backing to remove.  Past Medical History  Diagnosis Date  . Hemodialysis patient   . Hypertension   . Pulmonary emboli 01/2012    Bilateral, moderate clot burden, areas of pulmonary infarction and central necrosis  . CHF (congestive heart failure)   . Cardiomyopathy   . Dysrhythmia     at times per pt.  . Anemia   . H/O transfusion of packed red blood cells   . 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 Polycystic kidney disease     [facility]  [place], [place]    Past Surgical History  Procedure Laterality Date  . Nephrectomy    . Av fistula placement    . Kidney transplant  2008    failed  . Tonsillectomy      as a child.  . Adenoidectomy      Family History  Problem Relation Age of Onset  . Polycystic kidney disease Father     History  Substance Use Topics  . Smoking status: Former Smoker -- 0.20 packs/day    Types: Cigarettes    Quit date: 05/06/2012  . Smokeless tobacco: Never Used  . Alcohol Use: No    OB History   Grav Para Term Preterm Abortions TAB SAB Ect Mult Living                  Review of Systems   Constitutional: Negative for fever and chills.  HENT: Negative for trouble swallowing.   Respiratory: Negative for shortness of breath and wheezing.   Cardiovascular: Negative for chest pain.  Gastrointestinal: Negative for nausea and vomiting.    Allergies  Morphine and related; Tramadol; and Vicodin  Home Medications   Current Outpatient Rx  Name  Route  Sig  Dispense  Refill  . calcium acetate (PHOSLO) 667 MG capsule   Oral   Take 1,334 mg by mouth 3 (three) times daily with meals.          . cephALEXin (KEFLEX) 500 MG capsule   Oral   Take 1 capsule (500 mg total) by mouth 2 (two) times daily.   14 capsule   0   . multivitamin (RENA-VIT) TABS tablet   Oral   Take 1 tablet by mouth at bedtime.   30 tablet   0   . ondansetron (ZOFRAN ODT) 4 MG disintegrating tablet   Oral   Take 1 tablet (4 mg total) by mouth every 8 (eight) hours as needed for nausea.   20 tablet   0   . oxyCODONE-acetaminophen (PERCOCET/ROXICET) 5-325 MG per tablet   Oral   Take 2 tablets by mouth every 4 (  four) hours as needed for pain.   16 tablet   0   . oxyCODONE-acetaminophen (PERCOCET/ROXICET) 5-325 MG per tablet   Oral   Take 1 tablet by mouth every 8 (eight) hours as needed for pain.   15 tablet   0   . oxyCODONE-acetaminophen (PERCOCET/ROXICET) 5-325 MG per tablet   Oral   Take 2 tablets by mouth every 4 (four) hours as needed for pain.   6 tablet   0   . oxyCODONE-acetaminophen (PERCOCET/ROXICET) 5-325 MG per tablet   Oral   Take 1 tablet by mouth every 4 (four) hours as needed for pain.   6 tablet   0   . oxyCODONE-acetaminophen (PERCOCET/ROXICET) 5-325 MG per tablet   Oral   Take 1 tablet by mouth every 6 (six) hours as needed for pain.   10 tablet   0   . promethazine (PHENERGAN) 25 MG tablet   Oral   Take 25 mg by mouth every 6 (six) hours as needed. For nausea and vomiting          . EXPIRED: warfarin (COUMADIN) 5 MG tablet   Oral   Take 1 tablet (5 mg  total) by mouth daily.   30 tablet   0     Per INR. Take at 6:00 PM     BP 185/114  Pulse 88  Temp(Src) 98.7 F (37.1 C) (Oral)  Resp 20  Ht 5\' 3"  (1.6 m)  Wt 154 lb 1.6 oz (69.9 kg)  BMI 27.3 kg/m2  SpO2 97%  Physical Exam  Nursing note and vitals reviewed. Constitutional: She is oriented to person, place, and time. She appears well-developed and well-nourished.  Eyes: Pupils are equal, round, and reactive to light.  Neck: Normal range of motion.  Cardiovascular: Normal rate.   Murmur heard. Pulmonary/Chest: Effort normal and breath sounds normal.  Abdominal: Soft. Bowel sounds are normal.  Musculoskeletal: Normal range of motion. She exhibits no edema and no tenderness.  Lymphadenopathy:    She has no cervical adenopathy.  Neurological: She is alert and oriented to person, place, and time.  Skin: Skin is warm and dry.  Psychiatric: She has a normal mood and affect. Her behavior is normal. Judgment and thought content normal.  Large area of induration to right cheek, around a facial stud placed 4 months ago.  Unable to palpate the inner portion of the stud d/t significant swelling.  ED Course  Procedures (including critical care time)  Labs Reviewed - No data to display No results found.   No diagnosis found.  Patient seen and evaluated by Dr. Bernette Mayers.  EJ IV placed.  Patient started on vancomycin.  Transferred to [facility] ED for evaluation by Dr. Pollyann Kennedy and potential admission.   MDM          Jimmye Norman, NP 03/20/13 2308

## 2013-03-20 NOTE — ED Provider Notes (Addendum)
Medical screening examination/treatment/procedure(s) were conducted as a shared visit with non-physician practitioner(s) and myself.  I personally evaluated the patient during the encounter  Diabetic ESRD patient with marked facial cellulitis/abscess and fever. Has an embedded facial piercing in this area that cannot be removed in the ED. Transfer to [facility] for ENT evaluation and plan admission for Abx.   Angiocath insertion Performed by: Pollyann Savoy.  Consent: Verbal consent obtained. Risks and benefits: risks, benefits and alternatives were discussed Time out: Immediately prior to procedure a "time out" was called to verify the correct patient, procedure, equipment, support staff and site/side marked as required.  Preparation: Patient was prepped and draped in the usual sterile fashion.  Vein Location: R EJ  Gauge: 20  Normal blood return and flush without difficulty Patient tolerance: Patient tolerated the procedure well with no immediate complications.     Charles B. Bernette Mayers, MD 03/20/13 2358  Bonnita Levan. Bernette Mayers, MD 03/21/13 1308

## 2013-03-20 NOTE — ED Notes (Signed)
Has facial piercings states one in right cheek started swelling 2 days ago had dialysis today staff advised to go to ER for evaluation

## 2013-03-20 NOTE — ED Notes (Signed)
PA at bedside now  

## 2013-03-21 ENCOUNTER — Observation Stay (HOSPITAL_COMMUNITY): Payer: Managed Care, Other (non HMO) | Admitting: Anesthesiology

## 2013-03-21 ENCOUNTER — Encounter (HOSPITAL_COMMUNITY): Payer: Self-pay | Admitting: Anesthesiology

## 2013-03-21 ENCOUNTER — Encounter (HOSPITAL_COMMUNITY)
Admission: EM | Disposition: A | Discharge status: Home / Self Care | Payer: Self-pay | Source: Home / Self Care | Attending: Internal Medicine

## 2013-03-21 DIAGNOSIS — L03211 Cellulitis of face: Secondary | ICD-10-CM | POA: Diagnosis present

## 2013-03-21 DIAGNOSIS — N186 End stage renal disease: Secondary | ICD-10-CM

## 2013-03-21 DIAGNOSIS — D649 Anemia, unspecified: Secondary | ICD-10-CM

## 2013-03-21 DIAGNOSIS — L0201 Cutaneous abscess of face: Secondary | ICD-10-CM

## 2013-03-21 DIAGNOSIS — I10 Essential (primary) hypertension: Secondary | ICD-10-CM

## 2013-03-21 HISTORY — PX: INCISION AND DRAINAGE ABSCESS: SHX5864

## 2013-03-21 LAB — CBC
Hemoglobin: 8.6 g/dL — ABNORMAL LOW (ref 12.0–15.0)
MCH: 29.8 pg (ref 26.0–34.0)
Platelets: 213 10*3/uL (ref 150–400)
RBC: 2.89 MIL/uL — ABNORMAL LOW (ref 3.87–5.11)
WBC: 7.5 10*3/uL (ref 4.0–10.5)

## 2013-03-21 LAB — BASIC METABOLIC PANEL
CO2: 28 mEq/L (ref 19–32)
Calcium: 10.3 mg/dL (ref 8.4–10.5)
Chloride: 95 mEq/L — ABNORMAL LOW (ref 96–112)
Potassium: 4.3 mEq/L (ref 3.5–5.1)
Sodium: 138 mEq/L (ref 135–145)

## 2013-03-21 LAB — SURGICAL PCR SCREEN
MRSA, PCR: POSITIVE — AB
Staphylococcus aureus: POSITIVE — AB

## 2013-03-21 SURGERY — INCISION AND DRAINAGE, ABSCESS
Anesthesia: General | Site: Face | Laterality: Right | Wound class: Dirty or Infected

## 2013-03-21 MED ORDER — HEPARIN SODIUM (PORCINE) 1000 UNIT/ML DIALYSIS
1000.0000 [IU] | INTRAMUSCULAR | Status: DC | PRN
  Filled 2013-03-21: qty 1

## 2013-03-21 MED ORDER — FENTANYL CITRATE 0.05 MG/ML IJ SOLN
INTRAMUSCULAR | Status: AC
  Administered 2013-03-21: 25 ug via INTRAVENOUS
  Filled 2013-03-21: qty 2

## 2013-03-21 MED ORDER — FENTANYL CITRATE 0.05 MG/ML IJ SOLN
INTRAMUSCULAR | Status: DC | PRN
  Administered 2013-03-21: 150 ug via INTRAVENOUS

## 2013-03-21 MED ORDER — CALCIUM ACETATE 667 MG PO CAPS
1334.0000 mg | ORAL_CAPSULE | Freq: Three times a day (TID) | ORAL | Status: DC
Start: 2013-03-21 — End: 2013-03-24
  Administered 2013-03-21 – 2013-03-24 (×9): 1334 mg via ORAL
  Filled 2013-03-21 (×13): qty 2

## 2013-03-21 MED ORDER — VANCOMYCIN HCL IN DEXTROSE 750-5 MG/150ML-% IV SOLN
750.0000 mg | INTRAVENOUS | Status: DC
  Administered 2013-03-22: 750 mg via INTRAVENOUS
  Filled 2013-03-21 (×2): qty 150

## 2013-03-21 MED ORDER — LIDOCAINE HCL (PF) 1 % IJ SOLN: 5.0000 mL | INTRAMUSCULAR | Status: DC | PRN

## 2013-03-21 MED ORDER — SODIUM CHLORIDE 0.9 % IV SOLN
125.0000 mg | INTRAVENOUS | Status: DC
  Administered 2013-03-22: 125 mg via INTRAVENOUS
  Filled 2013-03-21 (×2): qty 10

## 2013-03-21 MED ORDER — PROMETHAZINE HCL 25 MG/ML IJ SOLN: 6.2500 mg | INTRAMUSCULAR | Status: DC | PRN

## 2013-03-21 MED ORDER — PENTAFLUOROPROP-TETRAFLUOROETH EX AERO: 1.0000 "application " | INHALATION_SPRAY | CUTANEOUS | Status: DC | PRN

## 2013-03-21 MED ORDER — RENA-VITE PO TABS
1.0000 | ORAL_TABLET | Freq: Every day | ORAL | Status: DC
  Administered 2013-03-21 – 2013-03-23 (×3): 1 via ORAL
  Filled 2013-03-21 (×4): qty 1

## 2013-03-21 MED ORDER — CLINDAMYCIN PHOSPHATE 300 MG/50ML IV SOLN
300.0000 mg | INTRAVENOUS | Status: AC
  Administered 2013-03-21: 300 mg via INTRAVENOUS
  Filled 2013-03-21: qty 50

## 2013-03-21 MED ORDER — SODIUM CHLORIDE 0.9 % IV SOLN
INTRAVENOUS | Status: DC
  Administered 2013-03-21 – 2013-03-22 (×2): via INTRAVENOUS

## 2013-03-21 MED ORDER — OXYCODONE HCL 5 MG PO TABS: 5.0000 mg | ORAL_TABLET | Freq: Once | ORAL | Status: DC | PRN

## 2013-03-21 MED ORDER — LIDOCAINE-PRILOCAINE 2.5-2.5 % EX CREA
1.0000 "application " | TOPICAL_CREAM | CUTANEOUS | Status: DC | PRN
  Filled 2013-03-21: qty 5

## 2013-03-21 MED ORDER — SUCCINYLCHOLINE CHLORIDE 20 MG/ML IJ SOLN
INTRAMUSCULAR | Status: DC | PRN
  Administered 2013-03-21: 80 mg via INTRAVENOUS

## 2013-03-21 MED ORDER — MEPERIDINE HCL 25 MG/ML IJ SOLN: 6.2500 mg | INTRAMUSCULAR | Status: DC | PRN

## 2013-03-21 MED ORDER — HYDROMORPHONE HCL PF 1 MG/ML IJ SOLN
1.0000 mg | INTRAMUSCULAR | Status: DC | PRN
  Administered 2013-03-21: 0.5 mg via INTRAVENOUS
  Administered 2013-03-22 – 2013-03-24 (×14): 1 mg via INTRAVENOUS
  Filled 2013-03-21 (×14): qty 1

## 2013-03-21 MED ORDER — SODIUM CHLORIDE 0.9 % IV SOLN: 100.0000 mL | INTRAVENOUS | Status: DC | PRN

## 2013-03-21 MED ORDER — CARVEDILOL 3.125 MG PO TABS
3.1250 mg | ORAL_TABLET | Freq: Two times a day (BID) | ORAL | Status: DC
  Administered 2013-03-21 – 2013-03-24 (×7): 3.125 mg via ORAL
  Filled 2013-03-21 (×9): qty 1

## 2013-03-21 MED ORDER — ONDANSETRON 4 MG PO TBDP
4.0000 mg | ORAL_TABLET | Freq: Three times a day (TID) | ORAL | Status: DC | PRN
  Administered 2013-03-24 (×2): 4 mg via ORAL
  Filled 2013-03-21 (×3): qty 1

## 2013-03-21 MED ORDER — ETOMIDATE 2 MG/ML IV SOLN
INTRAVENOUS | Status: DC | PRN
  Administered 2013-03-21: 16 mg via INTRAVENOUS

## 2013-03-21 MED ORDER — LIDOCAINE-EPINEPHRINE 1 %-1:100000 IJ SOLN
INTRAMUSCULAR | Status: DC | PRN
  Administered 2013-03-21: 2 mL

## 2013-03-21 MED ORDER — ALTEPLASE 2 MG IJ SOLR
2.0000 mg | Freq: Once | INTRAMUSCULAR | Status: AC | PRN
  Filled 2013-03-21: qty 2

## 2013-03-21 MED ORDER — DIPHENHYDRAMINE HCL 25 MG PO CAPS
25.0000 mg | ORAL_CAPSULE | Freq: Four times a day (QID) | ORAL | Status: DC | PRN
  Administered 2013-03-21 – 2013-03-22 (×3): 25 mg via ORAL
  Filled 2013-03-21 (×2): qty 1

## 2013-03-21 MED ORDER — VANCOMYCIN HCL 500 MG IV SOLR
500.0000 mg | Freq: Once | INTRAVENOUS | Status: AC
  Administered 2013-03-21: 500 mg via INTRAVENOUS
  Filled 2013-03-21: qty 500

## 2013-03-21 MED ORDER — HYDROMORPHONE HCL PF 1 MG/ML IJ SOLN
0.5000 mg | INTRAMUSCULAR | Status: DC | PRN
  Administered 2013-03-21 (×4): 0.5 mg via INTRAVENOUS
  Filled 2013-03-21 (×4): qty 1

## 2013-03-21 MED ORDER — FENTANYL CITRATE 0.05 MG/ML IJ SOLN
INTRAMUSCULAR | Status: AC
  Administered 2013-03-21: 50 ug via INTRAVENOUS
  Filled 2013-03-21: qty 2

## 2013-03-21 MED ORDER — HYDROMORPHONE HCL PF 1 MG/ML IJ SOLN
1.0000 mg | Freq: Once | INTRAMUSCULAR | Status: AC
  Administered 2013-03-21: 1 mg via INTRAVENOUS
  Filled 2013-03-21: qty 1

## 2013-03-21 MED ORDER — MUPIROCIN 2 % EX OINT
1.0000 "application " | TOPICAL_OINTMENT | Freq: Two times a day (BID) | CUTANEOUS | Status: DC
  Administered 2013-03-21 – 2013-03-24 (×7): 1 via NASAL
  Filled 2013-03-21: qty 22

## 2013-03-21 MED ORDER — CLINDAMYCIN HCL 300 MG PO CAPS
300.0000 mg | ORAL_CAPSULE | Freq: Four times a day (QID) | ORAL | Status: DC
  Administered 2013-03-21 – 2013-03-24 (×12): 300 mg via ORAL
  Filled 2013-03-21 (×20): qty 1

## 2013-03-21 MED ORDER — NEPRO/CARBSTEADY PO LIQD
237.0000 mL | ORAL | Status: DC | PRN
  Filled 2013-03-21: qty 237

## 2013-03-21 MED ORDER — OXYCODONE HCL 5 MG/5ML PO SOLN: 5.0000 mg | Freq: Once | ORAL | Status: DC | PRN

## 2013-03-21 MED ORDER — FENTANYL CITRATE 0.05 MG/ML IJ SOLN
25.0000 ug | INTRAMUSCULAR | Status: DC | PRN
  Administered 2013-03-21: 25 ug via INTRAVENOUS
  Administered 2013-03-21: 50 ug via INTRAVENOUS

## 2013-03-21 MED ORDER — MIDAZOLAM HCL 2 MG/2ML IJ SOLN: 0.5000 mg | Freq: Once | INTRAMUSCULAR | Status: DC | PRN

## 2013-03-21 MED ORDER — SODIUM CHLORIDE 0.9 % IV SOLN
INTRAVENOUS | Status: DC | PRN
  Administered 2013-03-21: 13:00:00 via INTRAVENOUS

## 2013-03-21 MED ORDER — HYDRALAZINE HCL 20 MG/ML IJ SOLN
10.0000 mg | Freq: Four times a day (QID) | INTRAMUSCULAR | Status: DC | PRN
  Administered 2013-03-24: 10 mg via INTRAVENOUS
  Filled 2013-03-21: qty 1

## 2013-03-21 MED ORDER — NEPRO/CARBSTEADY PO LIQD
237.0000 mL | Freq: Two times a day (BID) | ORAL | Status: DC
  Administered 2013-03-22 – 2013-03-23 (×3): 237 mL via ORAL
  Filled 2013-03-21 (×9): qty 237

## 2013-03-21 MED ORDER — MIDAZOLAM HCL 5 MG/5ML IJ SOLN
INTRAMUSCULAR | Status: DC | PRN
  Administered 2013-03-21: 1 mg via INTRAVENOUS

## 2013-03-21 MED ORDER — RAMIPRIL 1.25 MG PO CAPS
1.2500 mg | ORAL_CAPSULE | Freq: Every morning | ORAL | Status: DC
  Administered 2013-03-21 – 2013-03-22 (×2): 1.25 mg via ORAL
  Filled 2013-03-21 (×3): qty 1

## 2013-03-21 MED ORDER — CHLORHEXIDINE GLUCONATE CLOTH 2 % EX PADS
6.0000 | MEDICATED_PAD | Freq: Every day | CUTANEOUS | Status: DC
  Administered 2013-03-23 – 2013-03-24 (×2): 6 via TOPICAL

## 2013-03-21 MED ORDER — DARBEPOETIN ALFA-POLYSORBATE 60 MCG/0.3ML IJ SOLN
60.0000 ug | INTRAMUSCULAR | Status: DC
  Administered 2013-03-22: 60 ug via INTRAVENOUS
  Filled 2013-03-21: qty 0.3

## 2013-03-21 MED ORDER — DIPHENHYDRAMINE HCL 50 MG/ML IJ SOLN
12.5000 mg | Freq: Once | INTRAMUSCULAR | Status: AC
  Administered 2013-03-21: 12.5 mg via INTRAVENOUS
  Filled 2013-03-21: qty 1

## 2013-03-21 MED ORDER — SODIUM CHLORIDE 0.9 % IJ SOLN: 3.0000 mL | Freq: Two times a day (BID) | INTRAMUSCULAR | Status: DC

## 2013-03-21 MED ORDER — ONDANSETRON HCL 4 MG/2ML IJ SOLN
4.0000 mg | Freq: Once | INTRAMUSCULAR | Status: AC
  Administered 2013-03-21: 4 mg via INTRAVENOUS
  Filled 2013-03-21: qty 2

## 2013-03-21 MED ORDER — DOXERCALCIFEROL 4 MCG/2ML IV SOLN
8.0000 ug | INTRAVENOUS | Status: DC
  Administered 2013-03-22: 8 ug via INTRAVENOUS
  Filled 2013-03-21: qty 4

## 2013-03-21 SURGICAL SUPPLY — 46 items
BANDAGE CONFORM 2  STR LF (GAUZE/BANDAGES/DRESSINGS) IMPLANT
BANDAGE GAUZE ELAST BULKY 4 IN (GAUZE/BANDAGES/DRESSINGS) IMPLANT
BLADE SURG 15 STRL LF DISP TIS (BLADE) IMPLANT
BLADE SURG 15 STRL SS (BLADE)
CANISTER SUCTION 2500CC (MISCELLANEOUS) ×2 IMPLANT
CATH ROBINSON RED A/P 16FR (CATHETERS) IMPLANT
CLEANER TIP ELECTROSURG 2X2 (MISCELLANEOUS) IMPLANT
CLOTH BEACON ORANGE TIMEOUT ST (SAFETY) ×2 IMPLANT
CONT SPEC 4OZ CLIKSEAL STRL BL (MISCELLANEOUS) IMPLANT
COVER SURGICAL LIGHT HANDLE (MISCELLANEOUS) ×2 IMPLANT
DECANTER SPIKE VIAL GLASS SM (MISCELLANEOUS) ×2 IMPLANT
DRAIN PENROSE 1/4X12 LTX STRL (WOUND CARE) IMPLANT
DRSG EMULSION OIL 3X3 NADH (GAUZE/BANDAGES/DRESSINGS) IMPLANT
ELECT COATED BLADE 2.86 ST (ELECTRODE) IMPLANT
ELECT NEEDLE TIP 2.8 STRL (NEEDLE) IMPLANT
ELECT REM PT RETURN 9FT ADLT (ELECTROSURGICAL) ×2
ELECTRODE REM PT RTRN 9FT ADLT (ELECTROSURGICAL) ×1 IMPLANT
GAUZE PACKING IODOFORM 1/4X5 (PACKING) ×2 IMPLANT
GAUZE SPONGE 4X4 16PLY XRAY LF (GAUZE/BANDAGES/DRESSINGS) IMPLANT
GLOVE ECLIPSE 7.5 STRL STRAW (GLOVE) ×2 IMPLANT
GOWN STRL NON-REIN LRG LVL3 (GOWN DISPOSABLE) ×4 IMPLANT
KIT BASIN OR (CUSTOM PROCEDURE TRAY) ×2 IMPLANT
KIT ROOM TURNOVER OR (KITS) ×2 IMPLANT
NEEDLE 27GX1/2 REG BEVEL ECLIP (NEEDLE) ×2 IMPLANT
NEEDLE HYPO 30X.5 LL (NEEDLE) IMPLANT
NS IRRIG 1000ML POUR BTL (IV SOLUTION) ×2 IMPLANT
PAD ARMBOARD 7.5X6 YLW CONV (MISCELLANEOUS) ×4 IMPLANT
PENCIL FOOT CONTROL (ELECTRODE) IMPLANT
POUCH STERILIZING 3 X22 (STERILIZATION PRODUCTS) IMPLANT
SPONGE GAUZE 4X4 12PLY (GAUZE/BANDAGES/DRESSINGS) ×2 IMPLANT
SUT CHROMIC 4 0 P 3 18 (SUTURE) IMPLANT
SUT ETHILON 4 0 PS 2 18 (SUTURE) IMPLANT
SUT ETHILON 5 0 P 3 18 (SUTURE)
SUT NYLON ETHILON 5-0 P-3 1X18 (SUTURE) IMPLANT
SUT SILK 4 0 REEL (SUTURE) IMPLANT
SWAB COLLECTION DEVICE MRSA (MISCELLANEOUS) ×2 IMPLANT
SYR BULB IRRIGATION 50ML (SYRINGE) IMPLANT
SYR CONTROL 10ML LL (SYRINGE) ×2 IMPLANT
TAPE SURG TRANSPORE 1 IN (GAUZE/BANDAGES/DRESSINGS) ×1 IMPLANT
TAPE SURGICAL TRANSPORE 1 IN (GAUZE/BANDAGES/DRESSINGS) ×1
TOWEL OR 17X24 6PK STRL BLUE (TOWEL DISPOSABLE) ×2 IMPLANT
TOWEL OR 17X26 10 PK STRL BLUE (TOWEL DISPOSABLE) ×2 IMPLANT
TRAY ENT MC OR (CUSTOM PROCEDURE TRAY) ×2 IMPLANT
TUBE ANAEROBIC SPECIMEN COL (MISCELLANEOUS) IMPLANT
WATER STERILE IRR 1000ML POUR (IV SOLUTION) IMPLANT
YANKAUER SUCT BULB TIP NO VENT (SUCTIONS) ×2 IMPLANT

## 2013-03-21 NOTE — ED Notes (Signed)
Patient report given to [facility] for admission, nurse voiced understanding of report and had no further questions. Patient is in stable condition at this time for transport. Patient will be going to room via stretcher

## 2013-03-21 NOTE — Progress Notes (Signed)
[facility] PROGRESS NOTE  [name] Small OZH:[id number] DOB: 01-09-1991 DOA: 03/20/2013 PCP: Ellin Saba  Brief narrative [name] Small is a 22 y.o. female who presents to the ED at [facility] with an infection of a facial piercing infection, cellulitis, and suspected abscess of the R cheek. Piercing was performed about 4 months ago, was asymptomatic until 3 days ago when she developed pain in this area. Then rapidly developed swelling, erythema, and is now TTP. Had fever at [facility] she says though this is not documented objectively. Unable to remove piercing due to the inside of her cheek swelling over the metal backing. She was transferred to [facility] for ENT evaluation to remove the piercing after being given vancomycin. After arrival ENT said they would see the patient in the AM and have asked that the patient be admitted to the medical service.   Assessment/Plan: 1. Right cheek abscess/cellulitis due to infected facial piercing: Status post I&D by ENT. Continue antibiotics (oral clindamycin and IV vancomycin) and pain management. 2. Uncontrolled hypertension: Likely precipitated by pain from problem #1. Continue Altace, Coreg and add when necessary hydralazine. Volume removal across HD on 5/31. 3. ESRD: TTS dialysis. History of repeatedly missing outpatient dialysis. Nephrology consulted for dialysis needs. 4. Chronic anemia: Likely secondary to ESRD. Management per nephrology. 5. Medication noncompliance. Counseled.  Code Status: Full Family Communication: None Disposition Plan: Home when stable.   Consultants:  ENT  Nephrology  Procedures:  Incision and drainage of a right cheek abscess and removal of facial piercing.  Antibiotics:  Clindamycin   IV vancomycin  HPI/Subjective: Patient was seen prior to surgery-indicated that right facial pain had not significantly changed then created at 6/10. No drainage. No difficulty breathing  Objective: Filed Vitals:   03/21/13  1430 03/21/13 1445 03/21/13 1500 03/21/13 1539  BP: 172/114 169/116 159/106 168/121  Pulse: 94 92 95 98  Temp:   97.8 F (36.6 C) 99.4 F (37.4 C)  TempSrc:      Resp: 14 12 14 16   Height:      Weight:      SpO2: 98% 100% 99% 96%   No intake or output data in the 24 hours ending 03/21/13 1643 Filed Weights   03/20/13 1752  Weight: 69.9 kg (154 lb 1.6 oz)    Exam:   General exam: Comfortable. Obese.  Respiratory system: Clear. No increased work of breathing.  Cardiovascular system: S1 & S2 heard, RRR. No JVD, murmurs, gallops, clicks or pedal edema. Telemetry: Sinus rhythm.  Gastrointestinal system: Abdomen is nondistended, soft and nontender. Normal bowel sounds heard.  Central nervous system: Alert and oriented. No focal neurological deficits.  Extremities: Symmetric 5 x 5 power.  Head, eyes and ENT: Right maxillary area diffusely swollen, warm and tender with piercing in the middle of this area. Some of the swelling extends to the right side of the neck. No stridor. No fluctuation or crepitus.   Data Reviewed: Basic Metabolic Panel:  Recent Labs Lab 03/20/13 1850 03/21/13 0500  NA 141 138  K 4.1 4.3  CL 95* 95*  CO2 28 28  GLUCOSE 79 74  BUN 27* 29*  CREATININE 10.10* 10.54*  CALCIUM 10.3 10.3   Liver Function Tests:  Recent Labs Lab 03/20/13 1850  AST 13  ALT 11  ALKPHOS 66  BILITOT 0.3  PROT 8.2  ALBUMIN 3.8   No results found for this basename: LIPASE, AMYLASE,  in the last 168 hours No results found for this basename: AMMONIA,  in  the last 168 hours CBC:  Recent Labs Lab 03/20/13 1850 03/21/13 0500  WBC 8.5 7.5  NEUTROABS 6.5  --   HGB 8.3* 8.6*  HCT 25.2* 26.4*  MCV 90.0 91.3  PLT 220 213   Cardiac Enzymes: No results found for this basename: CKTOTAL, CKMB, CKMBINDEX, TROPONINI,  in the last 168 hours BNP (last 3 results) No results found for this basename: PROBNP,  in the last 8760 hours CBG:  Recent Labs Lab 03/21/13 1340   GLUCAP 81    Recent Results (from the past 240 hour(s))  SURGICAL PCR SCREEN     Status: Abnormal   Collection Time    03/21/13  8:23 AM      Result Value Range Status   MRSA, PCR POSITIVE (*) NEGATIVE Final   Staphylococcus aureus POSITIVE (*) NEGATIVE Final   Comment:            The Xpert SA Assay (FDA     approved for NASAL specimens     in patients over 57 years of age),     is one component of     a comprehensive surveillance     program.  Test performance has     been validated by [employer] for patients greater     than or equal to 61 year old.     It is not intended     to diagnose infection nor to     guide or monitor treatment.     Studies: No results found.   Additional labs:   Scheduled Meds: . calcium acetate  1,334 mg Oral TID WC  . carvedilol  3.125 mg Oral BID WC  . [START ON 03/22/2013] Chlorhexidine Gluconate Cloth  6 each Topical Q0600  . clindamycin  300 mg Oral Q6H  . [START ON 03/22/2013] darbepoetin (ARANESP) injection - DIALYSIS  60 mcg Intravenous Q Sat-HD  . [START ON 03/22/2013] doxercalciferol  8 mcg Intravenous Q T,Th,Sa-HD  . feeding supplement (NEPRO CARB STEADY)  237 mL Oral BID BM  . [START ON 03/22/2013] ferric gluconate (FERRLECIT/NULECIT) IV  125 mg Intravenous Q T,Th,Sa-HD  . multivitamin  1 tablet Oral QHS  . mupirocin ointment  1 application Nasal BID  . ramipril  1.25 mg Oral q morning - 10a  . sodium chloride  3 mL Intravenous Q12H  . [START ON 03/22/2013] vancomycin  750 mg Intravenous Q T,Th,Sa-HD   Continuous Infusions: . sodium chloride 20 mL/hr at 03/21/13 1217    Principal Problem:   Facial cellulitis & abscess Active Problems:   ESRD (end stage renal disease)   HTN (hypertension)   Anemia    Time spent: 35 mins    [facility]  [facility] Pager [phone].   If 8PM-8AM, please contact night-coverage at [email or web address], password [facility] 03/21/2013, 4:43 PM  LOS: 1 day

## 2013-03-21 NOTE — Progress Notes (Signed)
Report rec'd from Charlann Boxer RN, assuming care of patient at this time

## 2013-03-21 NOTE — ED Notes (Signed)
Hospitalist in room at this time.  

## 2013-03-21 NOTE — Anesthesia Preprocedure Evaluation (Addendum)
Anesthesia Evaluation  Patient identified by MRN, date of birth, ID band Patient awake    Reviewed: Allergy & Precautions, H&P , NPO status , Patient's Chart, lab work & pertinent test results, reviewed documented beta blocker date and time   History of Anesthesia Complications Negative for: history of anesthetic complications  Airway Mallampati: III TM Distance: >3 FB Neck ROM: Full  Mouth opening: Limited Mouth Opening  Dental  (+) Teeth Intact, Poor Dentition and Dental Advisory Given   Pulmonary neg pulmonary ROS,  breath sounds clear to auscultation  Pulmonary exam normal       Cardiovascular hypertension, Pt. on medications and Pt. on home beta blockers +CHF (5/13 EF 20%) Rhythm:Regular Rate:Normal     Neuro/Psych negative neurological ROS     GI/Hepatic negative GI ROS, Neg liver ROS,   Endo/Other    Renal/GU ESRF and DialysisRenal disease (dialyzed yesterday , K+ 4.3)     Musculoskeletal   Abdominal   Peds  Hematology  (+) Blood dyscrasia (Hb 8.6), ,   Anesthesia Other Findings   Reproductive/Obstetrics Urine preg NEG                          Anesthesia Physical Anesthesia Plan  ASA: III  Anesthesia Plan: General   Post-op Pain Management:    Induction: Intravenous and Rapid sequence  Airway Management Planned: Video Laryngoscope Planned  Additional Equipment:   Intra-op Plan:   Post-operative Plan: Possible Post-op intubation/ventilation  Informed Consent: I have reviewed the patients History and Physical, chart, labs and discussed the procedure including the risks, benefits and alternatives for the proposed anesthesia with the patient or authorized representative who has indicated his/her understanding and acceptance.   Dental advisory given  Plan Discussed with: CRNA and Surgeon  Anesthesia Plan Comments: (Plan routine monitors, GETA with VideoGlide intubation )         Anesthesia Quick Evaluation

## 2013-03-21 NOTE — Plan of Care (Signed)
Problem: Phase I Progression Outcomes Goal: Voiding-avoid urinary catheter unless indicated Outcome: Not Met (add Reason) Oliguric due to ESRD

## 2013-03-21 NOTE — Progress Notes (Signed)
ANTIBIOTIC CONSULT NOTE - INITIAL  Pharmacy Consult for vancomycin  Indication: R/o SSTI  Allergies  Allergen Reactions  . Tramadol Anaphylaxis  . Morphine And Related Itching    Ok with oxycodone  . Vicodin (Hydrocodone-Acetaminophen) Other (See Comments)    unknown    Patient Measurements: Height: 5\' 3"  (160 cm) Weight: 154 lb 1.6 oz (69.9 kg) IBW/kg (Calculated) : 52.4  Vital Signs: Temp: 98.5 F (36.9 C) (05/30 0005) Temp src: Oral (05/30 0005) BP: 167/99 mmHg (05/30 0311) Pulse Rate: 89 (05/30 0315) Intake/Output from previous day:   Intake/Output from this shift:    Labs:  Recent Labs  03/20/13 1850  WBC 8.5  HGB 8.3*  PLT 220  CREATININE 10.10*   Estimated Creatinine Clearance: 8.3 ml/min (by C-G formula based on Cr of 10.1). No results found for this basename: VANCOTROUGH, VANCOPEAK, VANCORANDOM, GENTTROUGH, GENTPEAK, GENTRANDOM, TOBRATROUGH, TOBRAPEAK, TOBRARND, AMIKACINPEAK, AMIKACINTROU, AMIKACIN,  in the last 72 hours   Microbiology: No results found for this or any previous visit (from the past 720 hour(s)).  Medical History: Past Medical History  Diagnosis Date  . Hemodialysis patient   . Hypertension   . Pulmonary emboli 01/2012    Bilateral, moderate clot burden, areas of pulmonary infarction and central necrosis  . CHF (congestive heart failure)   . Cardiomyopathy   . Dysrhythmia     at times per pt.  . Anemia   . H/O transfusion of packed red blood cells   . 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 Polycystic kidney disease     [facility]  T,Th, S    Medications:  Scheduled:  Assessment: 22 yo female with ESRD-HD TTS presented with SSTI associated with facial piercing. Patient has already received vancomycin 1gm IV x 1.   Goal of Therapy:  Pre-HD vancomycin level 15 - 25 mcg/mL  Plan:  1. Vancomycin 500mg  IV x 1  (total of 1500mg  vancomycin loading dose), then vancomycin 750mg  IV Q-HD TTS.   Emeline Gins 03/21/2013,4:12 AM

## 2013-03-21 NOTE — Consult Note (Signed)
[facility] Renal Consultation Note    Indication for Consultation:  Management of ESRD/hemodialysis; anemia, hypertension/volume and secondary hyperparathyroidism  HPI: [name] Small is a 22 y.o. female with ESRD secondary to PCKD on TTS dialysis at [facility],HTN,hx PE,  hx failed kidney transplant, dialysis noncompliance who was admitted with right cheek abscess and is s/p I and D with removal of cheek piercing. She started on dialysis at age 44, received a transplant in 2008 which subsequently failed and she resumed dialysis 2013. She transferred from [place] to [facility] at that time.  She reports a history of transplant nephrectomy. She had been seen yesterday at dialysis and was Rx amoxicillin for the check abscess although patient came to the ED for evaluation instead. She was unable to open fully open her mouth or remove the back screw.    The second issue for her is that yesterday she was given a 30 day letter of termination from [facility] due to chronic missed dialysis treatments and failure to stay on full treatments when she does come despite multiple warnings. She will have 30 days to find another practice to accept her. Labs drawn yesterday at her outpt dialysis center showed at K of 7.3 5/29 elevated due to missed dialysis treatments (her last treatment before the 29th was 5/22).  She runs between 2.5 - 3 of her prescribed 3.75 hours.  Past Medical History  Diagnosis Date  . Hemodialysis patient   . Hypertension   . Pulmonary emboli 01/2012    Bilateral, moderate clot burden, areas of pulmonary infarction and central necrosis  . CHF (congestive heart failure)   . Cardiomyopathy   . Dysrhythmia     at times per pt.  . Anemia   . H/O transfusion of packed red blood cells   . 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 Polycystic kidney disease     [facility]  dialy[facility] center  T,Th, S   Past Surgical History  Procedure Laterality Date  . Nephrectomy    . Av fistula placement    . Kidney transplant  2008    failed  . Tonsillectomy      as a child.  . Adenoidectomy     Family History  Problem Relation Age of Onset  . Polycystic kidney disease Father    Social History: Resides with her month.  reports that she quit smoking about 10 months ago. Her smoking use included Cigarettes. She smoked 0.20 packs per day. She has never used smokeless tobacco. She reports that she does not drink alcohol or use illicit drugs. Allergies  Allergen Reactions  . Tramadol Anaphylaxis  . Morphine And Related Itching    Ok with oxycodone  . Vicodin (Hydrocodone-Acetaminophen) Other (See Comments)    unknown   Prior to Admission medications   Medication Sig Start Date End Date Taking? Authorizing Provider  calcium acetate (PHOSLO) 667 MG capsule Take 1,334 mg by mouth 3 (three) times daily with meals.    Yes Historical Provider, MD  carvedilol (COREG) 3.125 MG tablet Take 3.125 mg by mouth 2 (two) times daily with a meal.   Yes Historical Provider, MD  multivitamin (RENA-VIT) TABS tablet Take 1 tablet by mouth every morning.   Yes Historical Provider, MD  ondansetron (ZOFRAN ODT) 4 MG disintegrating tablet Take 1 tablet (4 mg total) by mouth every 8 (eight) hours as needed for nausea. 02/18/13  Yes Ardyth Gal, MD  ramipril (ALTACE) 1.25 MG tablet Take 1.25 mg by mouth every morning.   Yes Historical Provider, MD   Current Facility-Administered Medications  Medication Dose Route Frequency Provider Last Rate Last Dose  . 0.9 %  sodium chloride infusion   Intravenous Continuous Germaine Pomfret, MD 20 mL/hr at 03/21/13 1217    . calcium acetate (PHOSLO) capsule 1,334 mg  1,334 mg Oral TID WC Hillary Bow, DO   1,334 mg at 03/21/13 0741  . carvedilol (COREG) tablet 3.125 mg  3.125 mg Oral BID WC Hillary Bow, DO   3.125 mg at 03/21/13 0741  .  clindamycin (CLEOCIN) capsule 300 mg  300 mg Oral Q6H Hillary Bow, DO   300 mg at 03/21/13 0741  . diphenhydrAMINE (BENADRYL) capsule 25 mg  25 mg Oral Q6H PRN Elease Etienne, MD   25 mg at 03/21/13 1029  . fentaNYL (SUBLIMAZE) 0.05 MG/ML injection           . fentaNYL (SUBLIMAZE) injection 25-50 mcg  25-50 mcg Intravenous Q5 min PRN E. Jairo Ben, MD   50 mcg at 03/21/13 1413  . HYDROmorphone (DILAUDID) injection 0.5 mg  0.5 mg Intravenous Q4H PRN Hillary Bow, DO   0.5 mg at 03/21/13 1002  . meperidine (DEMEROL) injection 6.25-12.5 mg  6.25-12.5 mg Intravenous Q5 min PRN E. Jairo Ben, MD      . midazolam (VERSED) injection 0.5-2 mg  0.5-2 mg Intravenous Once PRN E. Jairo Ben, MD      . multivitamin (RENA-VIT) tablet 1 tablet  1 tablet Oral QHS Hillary Bow, DO      . ondansetron (ZOFRAN-ODT) disintegrating tablet 4 mg  4 mg Oral Q8H PRN Hillary Bow, DO      . oxyCODONE (Oxy IR/ROXICODONE) immediate release tablet 5 mg  5 mg Oral Once PRN E. Jairo Ben, MD       Or  . oxyCODONE (ROXICODONE) 5 MG/5ML solution 5 mg  5 mg Oral Once PRN E. Jairo Ben, MD      . promethazine (PHENERGAN) injection 6.25-12.5 mg  6.25-12.5 mg Intravenous Q15 min PRN E. Jairo Ben, MD      . ramipril (ALTACE) capsule 1.25 mg  1.25 mg Oral q morning - 10a Hillary Bow, DO   1.25 mg at 03/21/13 1002  . sodium chloride 0.9 % injection 3 mL  3 mL Intravenous Q12H Hillary Bow, DO      . [START ON 03/22/2013] vancomycin (VANCOCIN) IVPB 750 mg/150 ml premix  750 mg Intravenous Q T,Th,Sa-HD Brandt Loosen, MD       Labs: Basic Metabolic Panel:  Recent Labs Lab 03/20/13 1850 03/21/13 0500  NA 141 138  K 4.1 4.3  CL 95* 95*  CO2 28 28  GLUCOSE 79 74  BUN 27* 29*  CREATININE 10.10* 10.54*  CALCIUM 10.3 10.3   Liver Function Tests:  Recent Labs Lab 03/20/13 1850  AST 13  ALT 11  ALKPHOS 66  BILITOT 0.3  PROT 8.2  ALBUMIN 3.8  CBC:  Recent  Labs Lab 03/20/13 1850 03/21/13 0500  WBC 8.5 7.5  NEUTROABS 6.5  --   HGB 8.3* 8.6*  HCT 25.2* 26.4*  MCV 90.0 91.3  PLT 220 213  CBG:  Recent Labs Lab 03/21/13 1340  GLUCAP 81  ROS: Limited  due to pain with talking; denies pain anywhere except right face; no SOB, no nausea; acknoweldged that she received the  terminiation letter from Dr. Briant Cedar yesterday Physical Exam: Filed Vitals:   03/21/13 0311 03/21/13 0315 03/21/13 0654 03/21/13 1336  BP: 167/99  168/107   Pulse: 93 89 84   Temp:   98.7 F (37.1 C) 97.9 F (36.6 C)  TempSrc:   Oral   Resp: 18  18   Height:      Weight:      SpO2: 100% 98% 98%      General: sitting up in PACU, uncomfortable with facial pain; hurts to talk Head: right facial swelling; dressing right cheek Neck: Supple.  Lungs: no wheezes or rales; breathing is unlabored. Heart: RRR with S1 S2. No murmurs, rubs, or gallops appreciated. Abdomen: Soft, non-tender, non-distended + BS M-S:  Strength and tone appear normal for age. Lower extremities: SCDS without edema or ischemic changes, no open wounds  Neuro: Alert and oriented . Moves all extremities spontaneously. Dialysis Access: left upper AVF + bruit  Dialysis Orders: Center: [facility] TTS 3.75 hr Optiflux 180 2 K 2.25 left upper AVF 400/A 1.5 no heparin EDW 69.5 Epo 3600 hectorol 8   Recent labs: 5/29 K 7.3 Fe 30 19% sat ferritin 618 Hgb 8.9 5/22 iPTH 600 5/22 down from 1100 in April   Assessment/Plan: 1. Right cheek abscess due to infected facial piercing - s/p I and D today; antibiotics per primary 2. ESRD -  TTS - given a 30 day notice letter from Dr. Briant Cedar 5/29 for termination from [facility] due to repeatedly missing dialysis treatments and signing off early despite warnings; HD orders written for Saturday. 3. Hypertension/volume  - decrease volume and continue altace and coreg 4. Anemia  - start course of Fe; continue Epo - Hgb low in part due to frequently missed EPO  doses 5. Metabolic bone disease -  Continue hectorol/ binders 6. Nutrition -  renal diet + vitamin 7. Medical noncompliance - discuss with her further when pain less severe  Sheffield Slider, PA-C [facility] Beeper [phone] 03/21/2013, 2:16 PM  I have seen and examined this patient and agree with plan as outlined above. Pt has just returned from PACU after I&D of cheek abscess. Current ATB's oral; clinda and to start vanco. MRSA PCR + and gram stain of wound from cheek is pending as is culture. She will undergo regular HD on Saturday.     First thing she asked me when I entered her room was if I could change her renal diet to a regular because she "does not like the renal diet".  This is reflective of her approach to HD, meds and diet and the basis for her 30 day dismissal letter for non compliance. (We did not discuss this issue further during my visit with her)  Qunisha Bryk B,MD 03/21/2013 3:32 PM

## 2013-03-21 NOTE — Preoperative (Signed)
Beta Blockers   Reason not to administer Beta Blockers:Not Applicable 

## 2013-03-21 NOTE — Consult Note (Signed)
Reason for Consult:Facial infection Referring Physician: Elease Etienne, MD  [name] Small is an 22 y.o. female.  HPI: Right cheek piercing got infected a few days ago. Has gotten worse over the past 2 days. Admitted early this am.  Past Medical History  Diagnosis Date  . Hemodialysis patient   . Hypertension   . Pulmonary emboli 01/2012    Bilateral, moderate clot burden, areas of pulmonary infarction and central necrosis  . CHF (congestive heart failure)   . Cardiomyopathy   . Dysrhythmia     at times per pt.  . Anemia   . H/O transfusion of packed red blood cells   . 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 Polycystic kidney disease     [facility]  T,Th, S    Past Surgical History  Procedure Laterality Date  . Nephrectomy    . Av fistula placement    . Kidney transplant  2008    failed  . Tonsillectomy      as a child.  . Adenoidectomy      Family History  Problem Relation Age of Onset  . Polycystic kidney disease Father     Social History:  reports that she quit smoking about 10 months ago. Her smoking use included Cigarettes. She smoked 0.20 packs per day. She has never used smokeless tobacco. She reports that she does not drink alcohol or use illicit drugs.  Allergies:  Allergies  Allergen Reactions  . Tramadol Anaphylaxis  . Morphine And Related Itching    Ok with oxycodone  . Vicodin (Hydrocodone-Acetaminophen) Other (See Comments)    unknown    Medications: Reviewed  Results for orders placed during the hospital encounter of 03/20/13 (from the past 48 hour(s))  CBC WITH DIFFERENTIAL     Status: Abnormal   Collection Time    03/20/13  6:50 PM      Result Value Range   WBC 8.5  4.0 - 10.5 K/uL   RBC 2.80 (*) 3.87 - 5.11 MIL/uL   Hemoglobin 8.3 (*) 12.0 - 15.0 g/dL   HCT 46.9 (*) 62.9 - 52.8 %   MCV 90.0  78.0 - 100.0 fL   MCH 29.6   26.0 - 34.0 pg   MCHC 32.9  30.0 - 36.0 g/dL   RDW 41.3  24.4 - 01.0 %   Platelets 220  150 - 400 K/uL   Neutrophils Relative % 77  43 - 77 %   Neutro Abs 6.5  1.7 - 7.7 K/uL   Lymphocytes Relative 13  12 - 46 %   Lymphs Abs 1.1  0.7 - 4.0 K/uL   Monocytes Relative 10  3 - 12 %   Monocytes Absolute 0.8  0.1 - 1.0 K/uL   Eosinophils Relative 0  0 - 5 %   Eosinophils Absolute 0.0  0.0 - 0.7 K/uL   Basophils Relative 0  0 - 1 %   Basophils Absolute 0.0  0.0 - 0.1 K/uL  COMPREHENSIVE METABOLIC PANEL     Status: Abnormal   Collection Time    03/20/13  6:50 PM      Result Value Range   Sodium 141  135 - 145 mEq/L   Potassium 4.1  3.5 - 5.1 mEq/L   Chloride 95 (*) 96 - 112 mEq/L   CO2 28  19 - 32 mEq/L   Glucose, Bld 79  70 -  99 mg/dL   BUN 27 (*) 6 - 23 mg/dL   Creatinine, Ser 16.10 (*) 0.50 - 1.10 mg/dL   Calcium 96.0  8.4 - 45.4 mg/dL   Total Protein 8.2  6.0 - 8.3 g/dL   Albumin 3.8  3.5 - 5.2 g/dL   AST 13  0 - 37 U/L   ALT 11  0 - 35 U/L   Alkaline Phosphatase 66  39 - 117 U/L   Total Bilirubin 0.3  0.3 - 1.2 mg/dL   GFR calc non Af Amer 5 (*) >90 mL/min   GFR calc Af Amer 6 (*) >90 mL/min   Comment:            The eGFR has been calculated     using the CKD EPI equation.     This calculation has not been     validated in all clinical     situations.     eGFR's persistently     <90 mL/min signify     possible Chronic Kidney Disease.  CBC     Status: Abnormal   Collection Time    03/21/13  5:00 AM      Result Value Range   WBC 7.5  4.0 - 10.5 K/uL   RBC 2.89 (*) 3.87 - 5.11 MIL/uL   Hemoglobin 8.6 (*) 12.0 - 15.0 g/dL   HCT 09.8 (*) 11.9 - 14.7 %   MCV 91.3  78.0 - 100.0 fL   MCH 29.8  26.0 - 34.0 pg   MCHC 32.6  30.0 - 36.0 g/dL   RDW 82.9  56.2 - 13.0 %   Platelets 213  150 - 400 K/uL  BASIC METABOLIC PANEL     Status: Abnormal   Collection Time    03/21/13  5:00 AM      Result Value Range   Sodium 138  135 - 145 mEq/L   Potassium 4.3  3.5 - 5.1 mEq/L    Chloride 95 (*) 96 - 112 mEq/L   CO2 28  19 - 32 mEq/L   Glucose, Bld 74  70 - 99 mg/dL   BUN 29 (*) 6 - 23 mg/dL   Creatinine, Ser 86.57 (*) 0.50 - 1.10 mg/dL   Calcium 84.6  8.4 - 96.2 mg/dL   GFR calc non Af Amer 5 (*) >90 mL/min   GFR calc Af Amer 5 (*) >90 mL/min   Comment:            The eGFR has been calculated     using the CKD EPI equation.     This calculation has not been     validated in all clinical     situations.     eGFR's persistently     <90 mL/min signify     possible Chronic Kidney Disease.    No results found.  [id number] except as listed in admit H&P  Blood pressure 168/107, pulse 84, temperature 98.7 F (37.1 C), temperature source Oral, resp. rate 18, height 5\' 3"  (1.6 m), weight 154 lb 1.6 oz (69.9 kg), SpO2 98.00%.  PHYSICAL EXAM: Overall appearance:  Ill appearing, in obvious discomfort Head:  Normocephalic, atraumatic. Ears: External ears are normal Nose: External nose is healthy in appearance. Internal nasal exam free of any lesions or obstruction. Face: right cheek with piercing in place externally, very tender and swollen with skin erythema.  Oral Cavity:  There are no mucosal lesions or masses identified. Very tender along the right buccal area without backing  of piercing palpable. Neuro:  No identifiable neurologic deficits. Neck: No palpable neck masses.  Studies Reviewed: none  Procedures: none   Assessment/Plan: Will perform I&D and removal of foreign body in OR later this morning.  Sabirin Baray 03/21/2013, 7:40 AM

## 2013-03-21 NOTE — Anesthesia Postprocedure Evaluation (Signed)
  Anesthesia Post-op Note  Patient: [name]  Procedure(s) Performed: Procedure(s): INCISION AND DRAINAGE RIGHT CHEEK ABSCESS REMOVAL OF FOREIGN BODY (Right)  Patient Location: PACU  Anesthesia Type:General  Level of Consciousness: awake, oriented, sedated and patient cooperative  Airway and Oxygen Therapy: Patient Spontanous Breathing  Post-op Pain: mild  Post-op Assessment: Post-op Vital signs reviewed, Patient's Cardiovascular Status Stable, Respiratory Function Stable, Patent Airway, No signs of Nausea or vomiting and Pain level controlled  Post-op Vital Signs: stable  Complications: No apparent anesthesia complications

## 2013-03-21 NOTE — Transfer of Care (Signed)
Immediate Anesthesia Transfer of Care Note  Patient: [name] Small  Procedure(s) Performed: Procedure(s): INCISION AND DRAINAGE RIGHT CHEEK ABSCESS REMOVAL OF FOREIGN BODY (Right)  Patient Location: PACU  Anesthesia Type:General  Level of Consciousness: awake and alert   Airway & Oxygen Therapy: Patient Spontanous Breathing and Patient connected to nasal cannula oxygen  Post-op Assessment: Report given to PACU RN and Post -op Vital signs reviewed and stable  Post vital signs: Reviewed and stable  Complications: No apparent anesthesia complications

## 2013-03-21 NOTE — H&P (Addendum)
[facility] History and Physical  Asheley Hellberg ZOX:[id number] DOB: 04/05/1991 DOA: 03/20/2013  Referring physician: ED PCP: Ellin Saba  Specialists: None  Chief Complaint: Facial cellulitis  HPI: [name] Small is a 22 y.o. female who presents to the ED at [facility] with an infection of a facial piercing infection, cellulitis, and suspected abscess of the R cheek.  Piercing was performed about 4 months ago, was asymptomatic until 3 days ago when she developed pain in this area.  Then rapidly developed swelling, erythema, and is now TTP.  Had fever at [facility] she says though this is not documented objectively.  Unable to remove piercing due to the inside of her cheek swelling over the metal backing.  She was transferred to [facility] for ENT evaluation to remove the piercing after being given vancomycin.  After arrival ENT said they would see the patient in the AM and have asked that the patient be admitted to the medical service.  Review of Systems: 12 systems reviewed and otherwise negative.  Past Medical History  Diagnosis Date  . Hemodialysis patient   . Hypertension   . Pulmonary emboli 01/2012    Bilateral, moderate clot burden, areas of pulmonary infarction and central necrosis  . CHF (congestive heart failure)   . Cardiomyopathy   . Dysrhythmia     at times per pt.  . Anemia   . H/O transfusion of packed red blood cells   . 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 Polycystic kidney disease     [facility]  T,Th, S   Past Surgical History  Procedure Laterality Date  . Nephrectomy    . Av fistula placement    . Kidney transplant  2008    failed  . Tonsillectomy      as a child.  . Adenoidectomy     Social History:  reports that she quit smoking about 10 months ago. Her smoking use included Cigarettes. She smoked 0.20 packs per day. She has never used smokeless  tobacco. She reports that she does not drink alcohol or use illicit drugs.   Allergies  Allergen Reactions  . Tramadol Anaphylaxis  . Morphine And Related Itching    Ok with oxycodone  . Vicodin (Hydrocodone-Acetaminophen) Other (See Comments)    unknown    Family History  Problem Relation Age of Onset  . Polycystic kidney disease Father     Prior to Admission medications   Medication Sig Start Date End Date Taking? Authorizing Provider  calcium acetate (PHOSLO) 667 MG capsule Take 1,334 mg by mouth 3 (three) times daily with meals.    Yes Historical Provider, MD  carvedilol (COREG) 3.125 MG tablet Take 3.125 mg by mouth 2 (two) times daily with a meal.   Yes Historical Provider, MD  multivitamin (RENA-VIT) TABS tablet Take 1 tablet by mouth every morning.   Yes Historical Provider, MD  ondansetron (ZOFRAN ODT) 4 MG disintegrating tablet Take 1 tablet (4 mg total) by mouth every 8 (eight) hours as needed for nausea. 02/18/13  Yes Ardyth Gal, MD  ramipril (ALTACE) 1.25 MG tablet Take 1.25 mg by mouth every morning.   Yes Historical Provider, MD   Physical Exam: Filed Vitals:   03/20/13 1753 03/21/13 0005 03/21/13 0311 03/21/13 0315  BP: 185/114 164/119 167/99   Pulse: 88 90 93 89  Temp: 98.7 F (37.1 C) 98.5 F (36.9 C)  TempSrc:  Oral    Resp: 20 18 18    Height:      Weight:      SpO2:  100% 100% 98%    General:  NAD, resting comfortably in bed Eyes: PEERLA EOMI ENT: trismus, R facial peircing with obvious cheek swelling, erythema, pain Neck: supple w/o JVD Cardiovascular: RRR w/o MRG Respiratory: CTA B Abdomen: soft, nt, nd, bs+ Skin: no rash nor lesion Musculoskeletal: MAE, full ROM all 4 extremities Psychiatric: normal tone and affect Neurologic: AAOx3, grossly non-focal  Labs on Admission:  Basic Metabolic Panel:  Recent Labs Lab 03/20/13 1850  NA 141  K 4.1  CL 95*  CO2 28  GLUCOSE 79  BUN 27*  CREATININE 10.10*  CALCIUM 10.3   Liver  Function Tests:  Recent Labs Lab 03/20/13 1850  AST 13  ALT 11  ALKPHOS 66  BILITOT 0.3  PROT 8.2  ALBUMIN 3.8   No results found for this basename: LIPASE, AMYLASE,  in the last 168 hours No results found for this basename: AMMONIA,  in the last 168 hours CBC:  Recent Labs Lab 03/20/13 1850  WBC 8.5  NEUTROABS 6.5  HGB 8.3*  HCT 25.2*  MCV 90.0  PLT 220   Cardiac Enzymes: No results found for this basename: CKTOTAL, CKMB, CKMBINDEX, TROPONINI,  in the last 168 hours  BNP (last 3 results) No results found for this basename: PROBNP,  in the last 8760 hours CBG: No results found for this basename: GLUCAP,  in the last 168 hours  Radiological Exams on Admission: No results found.  EKG: Independently reviewed.  Assessment/Plan Principal Problem:   Facial cellulitis Active Problems:   ESRD (end stage renal disease)   1. Facial cellulitis - due to infected facial piercing, needs ENT to eval and remove piercing, on vancomycin and clindamycin PO.  No signs nor symptoms of systemic involvement at this time.  Repeat CBC ordered for AM to follow WBC. 2. ESRD - Due to PKD, next dialysis due on Sat, not yet consulted nephrology as patient just admitted to observation at this point and not clear that she will require to remain in the hospital that long.   Code Status: Full Code (must indicate code status--if unknown or must be presumed, indicate so) Family Communication: No family in room (indicate person spoken with, if applicable, with phone number if by telephone) Disposition Plan: Admit to obs (indicate anticipated LOS)  Time spent: 50 min  GARDNER, JARED M. [facility] Pager [phone]  If 7PM-7AM, please contact night-coverage [email or web address] Password [email or web address] 03/21/2013, 4:19 AM

## 2013-03-21 NOTE — Anesthesia Procedure Notes (Signed)
Procedure Name: Intubation Date/Time: 03/21/2013 1:12 PM Performed by: Gwenyth Allegra Pre-anesthesia Checklist: Emergency Drugs available, Suction available, Patient being monitored, Patient identified and Timeout performed Patient Re-evaluated:Patient Re-evaluated prior to inductionOxygen Delivery Method: Circle system utilized Preoxygenation: Pre-oxygenation with 100% oxygen Intubation Type: IV induction and Rapid sequence Grade View: Grade I Tube type: Oral Tube size: 7.0 mm Number of attempts: 1 Airway Equipment and Method: Video-laryngoscopy Placement Confirmation: ETT inserted through vocal cords under direct vision,  breath sounds checked- equal and bilateral and positive ETCO2 Secured at: 21 cm Tube secured with: Tape Dental Injury: Teeth and Oropharynx as per pre-operative assessment

## 2013-03-21 NOTE — Op Note (Signed)
OPERATIVE REPORT  DATE OF SURGERY: 03/21/2013  PATIENT:  [name] Small,  22 y.o. female  PRE-OPERATIVE DIAGNOSIS:  CHEEK ABSCESS  POST-OPERATIVE DIAGNOSIS:  * No post-op diagnosis entered *  PROCEDURE:  Procedure(s): INCISION AND DRAINAGE RIGHT CHEEK ABSCESS REMOVAL OF FOREIGN BODY  SURGEON:  Susy Frizzle, MD  ASSISTANTS: none  ANESTHESIA:   General   EBL:  10 ml  DRAINS: none  LOCAL MEDICATIONS USED:  1% Xylocaine with epinephrine  SPECIMEN:  Culture of buccal abscess  COUNTS:  Correct  PROCEDURE DETAILS: The patient was taken to the operating room and placed on the operating table in the supine position. Following induction of general endotracheal anesthesia the right side of the face was prepped with Betadine solution and draped in a standard fashion. The piercing was viewed externally. It could not be seen internally along the buccal mucosa but there was induration where it was felt to normally line. Palpation of this area caused purulent material to discharge from internally. I was able to palpate the internal part of the piercing. The buccal mucosa was infiltrated with Xylocaine with epinephrine solution. A 15 scalpel was used to incise the mucosa overlying the piercing. The piercing was grasped with a hemostat and was pulled through the buccal side and removed completely. The abscess cavity was suctioned of all purulent material. This was packed externally with a quarter-inch iodoform gauze. Sterile dressing was applied. Patient was awakened extubated and transferred to recovery in stable condition.    PATIENT DISPOSITION:  To PACU, stable

## 2013-03-21 NOTE — ED Provider Notes (Signed)
History     CSN: [id number]  Arrival date & time 03/20/13  4098   First MD Initiated Contact with Patient 03/20/13 1825      Chief Complaint  Patient presents with  . facial piercing infection     (Consider location/radiation/quality/duration/timing/severity/associated sxs/prior treatment) HPI 22 yo F with multiple chronic medical problems including ESRD 2/2 to polycystic kidney disease receiving H/S on T/Th/S, history of PE on coumadin prophyaxis, cardiomyopathy.   She is referred to this ED from [facility] with facial cellulitis and suspected abscess on the right. Patient had a piercing of her right cheeck approx 4 months ago. She was asymptomic until 3d ago when she developed pain in this area. She quickly developed swelling, erythema and exquisite ttp. She was noted to have a fever at [facility], she says.  Patient is unable to remove the piercing because of epitheliazition and edema over the region of the stud at the mucosal surface of the right cheek.   Pain is 9/10. Worse with swallowing but, patient has been able to swallow food and liquids without difficulty. Patient developed nausea and vomiting and says "I can't keep anything down:" x 2d. Patient was treated with Vancomycin PTA.    Past Medical History  Diagnosis Date  . Hemodialysis patient   . Hypertension   . Pulmonary emboli 01/2012    Bilateral, moderate clot burden, areas of pulmonary infarction and central necrosis  . CHF (congestive heart failure)   . Cardiomyopathy   . Dysrhythmia     at times per pt.  . Anemia   . H/O transfusion of packed red blood cells   . 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 Polycystic kidney disease     [facility]  T,Th, S    Past Surgical History  Procedure Laterality Date  . Nephrectomy    . Av fistula placement    . Kidney transplant  2008    failed  . Tonsillectomy     as a child.  . Adenoidectomy      Family History  Problem Relation Age of Onset  . Polycystic kidney disease Father     History  Substance Use Topics  . Smoking status: Former Smoker -- 0.20 packs/day    Types: Cigarettes    Quit date: 05/06/2012  . Smokeless tobacco: Never Used  . Alcohol Use: No    OB History   Grav Para Term Preterm Abortions TAB SAB Ect Mult Living                  Review of Systems Gen: As per history of present illness, otherwise negative Eyes: no discharge or drainage, no occular pain or visual changes Nose: no epistaxis or rhinorrhea Mouth: no dental pain, no sore throat Face: as per hpi, otherwise negative Neck: no neck pain Lungs: no SOB, cough, wheezing CV: no chest pain, palpitations, dependent edema or orthopnea Abd: As per history of present illness, otherwise negative GU: no dysuria or gross hematuria MSK: no myalgias or arthralgias Neuro: no headache, no focal neurologic deficits Skin: no rash Psyche: negative.  Allergies  Tramadol; Morphine and related; and Vicodin  Home Medications   Current Outpatient Rx  Name  Route  Sig  Dispense  Refill  . calcium acetate (PHOSLO) 667 MG capsule   Oral   Take 1,334 mg by mouth 3 (three) times daily with meals.          Marland Kitchen  carvedilol (COREG) 3.125 MG tablet   Oral   Take 3.125 mg by mouth 2 (two) times daily with a meal.         . multivitamin (RENA-VIT) TABS tablet   Oral   Take 1 tablet by mouth every morning.         . ondansetron (ZOFRAN ODT) 4 MG disintegrating tablet   Oral   Take 1 tablet (4 mg total) by mouth every 8 (eight) hours as needed for nausea.   20 tablet   0   . ramipril (ALTACE) 1.25 MG tablet   Oral   Take 1.25 mg by mouth every morning.           BP 164/119  Pulse 90  Temp(Src) 98.5 F (36.9 C) (Oral)  Resp 18  Ht 5\' 3"  (1.6 m)  Wt 154 lb 1.6 oz (69.9 kg)  BMI 27.3 kg/m2  SpO2 100%  Physical Exam Gen: well developed and well nourished  appearing, cushinoid appearance, pleasant and cooperative Head: NCAT Eyes: PERL, EOMI FACE: edema, mild induration, increased warmth and exquisite ttp over the right cheek, piercing stud is in place in right cheek, however, post can't be visualized over the buccal mucosa, there is mild fluctuance appreciated in this region suggesting abscess.  Nose: no epistaixis or rhinorrhea Mouth/throat: mucosa is moist and pink Neck: supple, no stridor, right EJ in place Lungs: CTA B, no wheezing, rhonchi or rales Abd: soft, notender, nondistended Back: no ttp, no cva ttp Skin: no rashese, wnl Ext: h/d graft left upper arm Neuro: CN ii-xii grossly intact, no focal deficits Psyche; normal affect,  calm and cooperative.   ED Course  Procedures (including critical care time)  Results for orders placed during the hospital encounter of 03/20/13 (from the past 24 hour(s))  CBC WITH DIFFERENTIAL     Status: Abnormal   Collection Time    03/20/13  6:50 PM      Result Value Range   WBC 8.5  4.0 - 10.5 K/uL   RBC 2.80 (*) 3.87 - 5.11 MIL/uL   Hemoglobin 8.3 (*) 12.0 - 15.0 g/dL   HCT 40.9 (*) 81.1 - 91.4 %   MCV 90.0  78.0 - 100.0 fL   MCH 29.6  26.0 - 34.0 pg   MCHC 32.9  30.0 - 36.0 g/dL   RDW 78.2  95.6 - 21.3 %   Platelets 220  150 - 400 K/uL   Neutrophils Relative % 77  43 - 77 %   Neutro Abs 6.5  1.7 - 7.7 K/uL   Lymphocytes Relative 13  12 - 46 %   Lymphs Abs 1.1  0.7 - 4.0 K/uL   Monocytes Relative 10  3 - 12 %   Monocytes Absolute 0.8  0.1 - 1.0 K/uL   Eosinophils Relative 0  0 - 5 %   Eosinophils Absolute 0.0  0.0 - 0.7 K/uL   Basophils Relative 0  0 - 1 %   Basophils Absolute 0.0  0.0 - 0.1 K/uL  COMPREHENSIVE METABOLIC PANEL     Status: Abnormal   Collection Time    03/20/13  6:50 PM      Result Value Range   Sodium 141  135 - 145 mEq/L   Potassium 4.1  3.5 - 5.1 mEq/L   Chloride 95 (*) 96 - 112 mEq/L   CO2 28  19 - 32 mEq/L   Glucose, Bld 79  70 - 99 mg/dL   BUN 27 (*) 6 -  23  mg/dL   Creatinine, Ser 16.10 (*) 0.50 - 1.10 mg/dL   Calcium 96.0  8.4 - 45.4 mg/dL   Total Protein 8.2  6.0 - 8.3 g/dL   Albumin 3.8  3.5 - 5.2 g/dL   AST 13  0 - 37 U/L   ALT 11  0 - 35 U/L   Alkaline Phosphatase 66  39 - 117 U/L   Total Bilirubin 0.3  0.3 - 1.2 mg/dL   GFR calc non Af Amer 5 (*) >90 mL/min   GFR calc Af Amer 6 (*) >90 mL/min     1. Facial cellulitis       MDM  Facial cellulitis with suspected abscess and retained foreign body in an immunocomprimised patient.  ENT consulted over the phone and they will see in a couple of hours as Research scientist (medical). AGree with ED management. Case discussed with Dr. Lanae Boast who will see and admit for evaluation. We are managing symptomtically at this time.         Brandt Loosen, MD 03/21/13 [phone]

## 2013-03-22 DIAGNOSIS — L0201 Cutaneous abscess of face: Secondary | ICD-10-CM

## 2013-03-22 HISTORY — DX: Cutaneous abscess of face: L02.01

## 2013-03-22 LAB — CBC
HCT: 21.3 % — ABNORMAL LOW (ref 36.0–46.0)
Hemoglobin: 7 g/dL — ABNORMAL LOW (ref 12.0–15.0)
MCH: 29.9 pg (ref 26.0–34.0)
MCHC: 32.9 g/dL (ref 30.0–36.0)
MCV: 91 fL (ref 78.0–100.0)
Platelets: 187 10*3/uL (ref 150–400)
RBC: 2.34 MIL/uL — ABNORMAL LOW (ref 3.87–5.11)
RDW: 14 % (ref 11.5–15.5)
WBC: 5.4 10*3/uL (ref 4.0–10.5)

## 2013-03-22 LAB — RENAL FUNCTION PANEL
Albumin: 3 g/dL — ABNORMAL LOW (ref 3.5–5.2)
BUN: 39 mg/dL — ABNORMAL HIGH (ref 6–23)
CO2: 26 mEq/L (ref 19–32)
Calcium: 9.6 mg/dL (ref 8.4–10.5)
Chloride: 96 mEq/L (ref 96–112)
Creatinine, Ser: 13.04 mg/dL — ABNORMAL HIGH (ref 0.50–1.10)
GFR calc Af Amer: 4 mL/min — ABNORMAL LOW (ref >90)
GFR calc non Af Amer: 4 mL/min — ABNORMAL LOW (ref >90)
Glucose, Bld: 104 mg/dL — ABNORMAL HIGH (ref 70–99)
Phosphorus: 8 mg/dL — ABNORMAL HIGH (ref 2.3–4.6)
Potassium: 4.8 mEq/L (ref 3.5–5.1)
Sodium: 136 mEq/L (ref 135–145)

## 2013-03-22 MED ORDER — CALCIUM CARBONATE ANTACID 500 MG PO CHEW
1.0000 | CHEWABLE_TABLET | Freq: Four times a day (QID) | ORAL | Status: DC | PRN
  Administered 2013-03-22 – 2013-03-24 (×6): 200 mg via ORAL
  Filled 2013-03-22 (×8): qty 1

## 2013-03-22 NOTE — Progress Notes (Signed)
[facility] PROGRESS NOTE  [name] Small NFA:[id number] DOB: 1991-04-20 DOA: 03/20/2013 PCP: Ellin Saba  Brief narrative [name] Small is a 22 y.o. female who presents to the ED at [facility] with an infection of a facial piercing infection, cellulitis, and suspected abscess of the R cheek. Piercing was performed about 4 months ago, was asymptomatic until 3 days ago when she developed pain in this area. Then rapidly developed swelling, erythema, and is now TTP. Had fever at [facility] she says though this is not documented objectively. Unable to remove piercing due to the inside of her cheek swelling over the metal backing. She was transferred to [facility] for ENT evaluation to remove the piercing after being given vancomycin. After arrival ENT said they would see the patient in the AM and have asked that the patient be admitted to the medical service.   Assessment/Plan: 1. Right cheek abscess/cellulitis due to infected facial piercing: Status post I&D by ENT. Continue antibiotics (oral clindamycin and IV vancomycin) and pain management. Improving. 2. Uncontrolled hypertension: Likely precipitated by pain from problem #1. Continue Altace, Coreg and add when necessary hydralazine. Volume removal across HD on 5/31. Monitor post dialysis today. 3. ESRD: TTS dialysis. History of repeatedly missing outpatient dialysis. Nephrology input appreciated. For dialysis today. 4. Chronic anemia: Likely secondary to ESRD. Management per nephrology.  5. Medication noncompliance. Counseled.  Code Status: Full Family Communication: None Disposition Plan: Home when stable.   Consultants:  ENT  Nephrology  Procedures:  Incision and drainage of a right cheek abscess and removal of facial piercing on 5/30.  Antibiotics:  Clindamycin   IV vancomycin  HPI/Subjective: Right cheek pain and swelling have slightly improved compared to yesterday. No difficulty breathing.  Objective: Filed Vitals:   03/22/13  1430 03/22/13 1508 03/22/13 1511 03/22/13 1622  BP: 167/104 185/120 169/109 171/103  Pulse: 86 83 84 90  Temp:   98.2 F (36.8 C) 99.5 F (37.5 C)  TempSrc:   Oral   Resp: 17 16 16 16   Height:      Weight:   69.6 kg (153 lb 7 oz)   SpO2:   100% 99%    Intake/Output Summary (Last 24 hours) at 03/22/13 1808 Last data filed at 03/22/13 1511  Gross per 24 hour  Intake    393 ml  Output   3000 ml  Net  -2607 ml   Filed Weights   03/20/13 1752 03/22/13 1105 03/22/13 1511  Weight: 69.9 kg (154 lb 1.6 oz) 72.9 kg (160 lb 11.5 oz) 69.6 kg (153 lb 7 oz)    Exam:   General exam: Comfortable. Obese.  Respiratory system: Clear. No increased work of breathing.  Cardiovascular system: S1 & S2 heard, RRR. No JVD, murmurs, gallops, clicks or pedal edema. Telemetry: Sinus rhythm.  Gastrointestinal system: Abdomen is nondistended, soft and nontender. Normal bowel sounds heard.  Central nervous system: Alert and oriented. No focal neurological deficits.  Extremities: Symmetric 5 x 5 power.  Head, eyes and ENT: Right maxillary area diffusely swollen, warm and tender-slightly better compared to 5/30   Data Reviewed: Basic Metabolic Panel:  Recent Labs Lab 03/20/13 1850 03/21/13 0500 03/22/13 1110  NA 141 138 136  K 4.1 4.3 4.8  CL 95* 95* 96  CO2 28 28 26   GLUCOSE 79 74 104*  BUN 27* 29* 39*  CREATININE 10.10* 10.54* 13.04*  CALCIUM 10.3 10.3 9.6  PHOS  --   --  8.0*   Liver Function Tests:  Recent Labs  Lab 03/20/13 1850 03/22/13 1110  AST 13  --   ALT 11  --   ALKPHOS 66  --   BILITOT 0.3  --   PROT 8.2  --   ALBUMIN 3.8 3.0*   No results found for this basename: LIPASE, AMYLASE,  in the last 168 hours No results found for this basename: AMMONIA,  in the last 168 hours CBC:  Recent Labs Lab 03/20/13 1850 03/21/13 0500 03/22/13 1110  WBC 8.5 7.5 5.4  NEUTROABS 6.5  --   --   HGB 8.3* 8.6* 7.0*  HCT 25.2* 26.4* 21.3*  MCV 90.0 91.3 91.0  PLT 220 213  187   Cardiac Enzymes: No results found for this basename: CKTOTAL, CKMB, CKMBINDEX, TROPONINI,  in the last 168 hours BNP (last 3 results) No results found for this basename: PROBNP,  in the last 8760 hours CBG:  Recent Labs Lab 03/21/13 1340  GLUCAP 81    Recent Results (from the past 240 hour(s))  SURGICAL PCR SCREEN     Status: Abnormal   Collection Time    03/21/13  8:23 AM      Result Value Range Status   MRSA, PCR POSITIVE (*) NEGATIVE Final   Staphylococcus aureus POSITIVE (*) NEGATIVE Final   Comment:            The Xpert SA Assay (FDA     approved for NASAL specimens     in patients over 58 years of age),     is one component of     a comprehensive surveillance     program.  Test performance has     been validated by [employer] for patients greater     than or equal to 63 year old.     It is not intended     to diagnose infection nor to     guide or monitor treatment.  WOUND CULTURE     Status: None   Collection Time    03/21/13  1:20 PM      Result Value Range Status   Specimen Description WOUND FACE RIGHT   Final   Special Requests RIGHT CHEEK   Final   Gram Stain     Final   Value: RARE WBC PRESENT, PREDOMINANTLY PMN     NO SQUAMOUS EPITHELIAL CELLS SEEN     RARE GRAM POSITIVE COCCI     IN PAIRS   Culture NO GROWTH 1 DAY   Final   Report Status PENDING   Incomplete     Studies: No results found.   Additional labs:   Scheduled Meds: . calcium acetate  1,334 mg Oral TID WC  . carvedilol  3.125 mg Oral BID WC  . Chlorhexidine Gluconate Cloth  6 each Topical Q0600  . clindamycin  300 mg Oral Q6H  . darbepoetin (ARANESP) injection - DIALYSIS  60 mcg Intravenous Q Sat-HD  . doxercalciferol  8 mcg Intravenous Q T,Th,Sa-HD  . feeding supplement (NEPRO CARB STEADY)  237 mL Oral BID BM  . ferric gluconate (FERRLECIT/NULECIT) IV  125 mg Intravenous Q T,Th,Sa-HD  . multivitamin  1 tablet Oral QHS  . mupirocin ointment  1 application Nasal BID   . ramipril  1.25 mg Oral q morning - 10a  . sodium chloride  3 mL Intravenous Q12H  . vancomycin  750 mg Intravenous Q T,Th,Sa-HD   Continuous Infusions: . sodium chloride 20 mL/hr at 03/22/13 1629    Principal Problem:  Facial cellulitis & abscess Active Problems:   ESRD (end stage renal disease)   HTN (hypertension)   Anemia    Time spent: 35 mins    [facility]  [facility] Pager [phone].   If 8PM-8AM, please contact night-coverage at [email or web address], password [facility] 03/22/2013, 6:08 PM  LOS: 2 days

## 2013-03-22 NOTE — Progress Notes (Addendum)
Subjective:  Seen in hemodialysis States face feels better  Getting IV vanco Op cultures pending/gram stain GPC in pairs Objective Vital signs in last 24 hours: Filed Vitals:   03/21/13 2120 03/22/13 0649 03/22/13 1105 03/22/13 1112  BP: 146/88 145/89 155/102 151/102  Pulse: 90 85 86 82  Temp: 98.7 F (37.1 C) 98.6 F (37 C) 98.7 F (37.1 C)   TempSrc:   Oral   Resp: 20 20 19 10   Height:      Weight:   72.9 kg (160 lb 11.5 oz)   SpO2: 96% 99% 100%    Weight change:   Intake/Output Summary (Last 24 hours) at 03/22/13 1127 Last data filed at 03/22/13 0756  Gross per 24 hour  Intake    393 ml  Output      0 ml  Net    393 ml   Physical Exam:  Blood pressure 151/102, pulse 82, temperature 98.7 F (37.1 C), temperature source Oral, resp. rate 10, height 5\' 3"  (1.6 m), weight 72.9 kg (160 lb 11.5 oz), SpO2 100.00%. Pre HD weight 72.9 (EDW 69.5) Small dressing on right cheek Facial swelling improving Lungs clear No sig edema Left upper AVF cannulated  Recent Labs Lab 03/20/13 1850 03/21/13 0500  NA 141 138  K 4.1 4.3  CL 95* 95*  CO2 28 28  GLUCOSE 79 74  BUN 27* 29*  CREATININE 10.10* 10.54*  CALCIUM 10.3 10.3   Liver Function Tests:  Recent Labs Lab 03/20/13 1850  AST 13  ALT 11  ALKPHOS 66  BILITOT 0.3  PROT 8.2  ALBUMIN 3.8   Recent Labs Lab 03/20/13 1850 03/21/13 0500  WBC 8.5 7.5  NEUTROABS 6.5  --   HGB 8.3* 8.6*  HCT 25.2* 26.4*  MCV 90.0 91.3  PLT 220 213   Recent Labs Lab 03/21/13 1340  GLUCAP 81   Specimen Description WOUND FACE RIGHT Special Requests RIGHT CHEEK Gram Stain RARE WBC PRESENT, PREDOMINANTLY PMN NO SQUAMOUS EPITHELIAL CELLS SEEN RARE GRAM POSITIVE COCCI IN PAIRS Culture NO GROWTH 1 DAY  Medications: . sodium chloride 20 mL/hr at 03/21/13 1217   . calcium acetate  1,334 mg Oral TID WC  . carvedilol  3.125 mg Oral BID WC  . Chlorhexidine Gluconate Cloth  6 each Topical Q0600  . clindamycin  300 mg Oral Q6H  .  darbepoetin (ARANESP) injection - DIALYSIS  60 mcg Intravenous Q Sat-HD  . doxercalciferol  8 mcg Intravenous Q T,Th,Sa-HD  . feeding supplement (NEPRO CARB STEADY)  237 mL Oral BID BM  . ferric gluconate (FERRLECIT/NULECIT) IV  125 mg Intravenous Q T,Th,Sa-HD  . multivitamin  1 tablet Oral QHS  . mupirocin ointment  1 application Nasal BID  . ramipril  1.25 mg Oral q morning - 10a  . sodium chloride  3 mL Intravenous Q12H  . vancomycin  750 mg Intravenous Q T,Th,Sa-HD   I  have reviewed scheduled and prn medications.  Dialysis Orders: Center: [facility] TTS 3.75 hr Optiflux 180 2 K 2.25 left upper AVF 400/A 1.5 no heparin EDW 69.5 Epo 3600 hectorol 8   Assessment/Plan:  1. Right cheek abscess due to infected facial piercing - s/p I and D 5/30 with removal of piercing ; antibiotics per primary (vancomycin and clindamycin at present) 2. ESRD - TTS - given a 30 day notice letter from Dr. Briant Cedar 5/29 for termination from [facility] due to repeatedly missing dialysis treatments and signing off early despite warnings; Pt states she  has a plan for pursuing another dialysis facility 3. Hypertension/volume - decrease volume and continue altace and coreg 4. Anemia - starting course of Fe; continue Aranesp (for Epo) - Hgb low in part due to frequently missed EPO doses 5. Metabolic bone disease - Continue hectorol/ binders 6. Nutrition - renal diet + vitamin 7. Medical noncompliance - pt acknowledges that she has received her dismissal letter and has plan to pursue other HD locations 8. MRSA PCR + - contact precautions   Camille Bal, MD [facility] [phone] pager 03/22/2013, 11:27 AM

## 2013-03-22 NOTE — Procedures (Signed)
I have personally attended this patient's dialysis session.  Left AVF BFR 400  Pre weight 72.9 with EDW of  Bath is 2K with lytes pending.  BP 158/88 Pre weight 72.9 with EDW of 69.5 Tolerating well thus far   [name] Small B

## 2013-03-23 LAB — WOUND CULTURE

## 2013-03-23 LAB — CBC
Hemoglobin: 7.7 g/dL — ABNORMAL LOW (ref 12.0–15.0)
MCH: 29.7 pg (ref 26.0–34.0)
MCHC: 32.1 g/dL (ref 30.0–36.0)
Platelets: 208 10*3/uL (ref 150–400)
RDW: 14.1 % (ref 11.5–15.5)

## 2013-03-23 LAB — TYPE AND SCREEN: ABO/RH(D): O POS

## 2013-03-23 MED ORDER — FAMOTIDINE 20 MG PO TABS
20.0000 mg | ORAL_TABLET | Freq: Every day | ORAL | Status: DC
  Administered 2013-03-23 – 2013-03-24 (×2): 20 mg via ORAL
  Filled 2013-03-23 (×2): qty 1

## 2013-03-23 MED ORDER — RAMIPRIL 5 MG PO CAPS
5.0000 mg | ORAL_CAPSULE | Freq: Two times a day (BID) | ORAL | Status: DC
  Administered 2013-03-23 – 2013-03-24 (×3): 5 mg via ORAL
  Filled 2013-03-23 (×4): qty 1

## 2013-03-23 NOTE — Progress Notes (Addendum)
Subjective:  Had HD Saturday no issues Got close to EDW Complains of heartburn and feels neck area under chin is fuller Getting IV vanco Op cultures pending/gram stain GPC in pairs culture multiple organisms none predominant No staph  Objective Vital signs in last 24 hours: Filed Vitals:   03/22/13 1511 03/22/13 1622 03/22/13 2234 03/23/13 0500  BP: 169/109 171/103 143/92 168/112  Pulse: 84 90 90 84  Temp: 98.2 F (36.8 C) 99.5 F (37.5 C) 98.9 F (37.2 C) 98.5 F (36.9 C)  TempSrc: Oral     Resp: 16 16 16 16   Height:      Weight: 69.6 kg (153 lb 7 oz)     SpO2: 100% 99% 98% 100%   Weight change:   Intake/Output Summary (Last 24 hours) at 03/23/13 1038 Last data filed at 03/22/13 1511  Gross per 24 hour  Intake      0 ml  Output   3000 ml  Net  -3000 ml   Physical Exam:  Blood pressure 151/102, pulse 82, temperature 98.7 F (37.1 C), temperature source Oral, resp. rate 10, height 5\' 3"  (1.6 m), weight 72.9 kg (160 lb 11.5 oz), SpO2 100.00%. Pre HD weight 72.9 (EDW 69.5) Post HD weight 69.6   Small dressing on right cheek Facial swelling improving Some fullness to neck but no discrete area of tenderness other than along the jaw line on the right Lungs clear No sig edema Left upper AVF bruit and thrill  Recent Labs Lab 03/20/13 1850 03/21/13 0500 03/22/13 1110  NA 141 138 136  K 4.1 4.3 4.8  CL 95* 95* 96  CO2 28 28 26   GLUCOSE 79 74 104*  BUN 27* 29* 39*  CREATININE 10.10* 10.54* 13.04*  CALCIUM 10.3 10.3 9.6  PHOS  --   --  8.0*   Liver Function Tests:  Recent Labs Lab 03/20/13 1850 03/22/13 1110  AST 13  --   ALT 11  --   ALKPHOS 66  --   BILITOT 0.3  --   PROT 8.2  --   ALBUMIN 3.8 3.0*    Recent Labs Lab 03/20/13 1850 03/21/13 0500 03/22/13 1110  WBC 8.5 7.5 5.4  NEUTROABS 6.5  --   --   HGB 8.3* 8.6* 7.0*  HCT 25.2* 26.4* 21.3*  MCV 90.0 91.3 91.0  PLT 220 213 187    Recent Labs Lab 03/21/13 1340  GLUCAP 81   Specimen  Description WOUND FACE RIGHT Special Requests RIGHT CHEEK Gram Stain RARE WBC PRESENT, PREDOMINANTLY PMN NO SQUAMOUS EPITHELIAL CELLS SEEN RARE GRAM POSITIVE COCCI IN PAIRS Culture NO GROWTH 1 DAY  03/23/13 0830 Specimen Description WOUND FACE RIGHT Special Requests RIGHT CHEEK Gram Stain - Result: RARE WBC PRESENT, PREDOMINANTLY PMN NO SQUAMOUS EPITHELIAL CELLS SEEN RARE GRAM POSITIVE COCCI IN PAIRS Culture Result: MULTIPLE ORGANISMS PRESENT, NONE PREDOMINANT NO STAPHYLOCOCCUS AUREUS ISOLATED NO GROUP A STREP (S.PYOGENES) ISOLATED Report Status 03/23/2013 FINAL  Medications: . sodium chloride 20 mL/hr at 03/22/13 1629   . calcium acetate  1,334 mg Oral TID WC  . carvedilol  3.125 mg Oral BID WC  . Chlorhexidine Gluconate Cloth  6 each Topical Q0600  . clindamycin  300 mg Oral Q6H  . darbepoetin (ARANESP) injection - DIALYSIS  60 mcg Intravenous Q Sat-HD  . doxercalciferol  8 mcg Intravenous Q T,Th,Sa-HD  . feeding supplement (NEPRO CARB STEADY)  237 mL Oral BID BM  . ferric gluconate (FERRLECIT/NULECIT) IV  125 mg Intravenous Q T,Th,Sa-HD  .  multivitamin  1 tablet Oral QHS  . mupirocin ointment  1 application Nasal BID  . ramipril  5 mg Oral BID  . sodium chloride  3 mL Intravenous Q12H  . vancomycin  750 mg Intravenous Q T,Th,Sa-HD   I  have reviewed scheduled and prn medications.  Dialysis Orders: Center: [facility] TTS 3.75 hr Optiflux 180 2 K 2.25 left upper AVF 400/A 1.5 no heparin EDW 69.5 Epo 3600 hectorol 8   Assessment/Plan:  1. Right cheek abscess due to infected facial piercing - s/p I and D 5/30 with removal of piercing ; antibiotics per primary (vancomycin and clindamycin at present) Culture multiple orgs.  No surgery f/u as of yet. 2. ESRD - TTS - given a 30 day notice letter from Dr. Briant Cedar 5/29 for termination from [facility] due to repeatedly missing dialysis treatments and signing off early despite warnings; Pt states she has a plan for pursuing another dialysis  facility 3. Hypertension/volume - decrease volume and continue ramipril and coreg. Primary has increased the ramipril to 5 BID. 4. Anemia - starting course of Fe; continue Aranesp at 60/week (for Epo) - Hgb low in part due to frequently missed EPO doses. Last Hb 7. Repeat pending 5. Metabolic bone disease - Continue hectorol/ binders 6. Nutrition - renal diet + vitamin 7. Medical noncompliance - pt acknowledges that she has received her dismissal letter and has plan to pursue other HD locations 8. MRSA PCR + - contact precautions 9. Heartburn. Tums "not working". Add pepcid.   Camille Bal, MD [facility] [phone] pager 03/23/2013, 10:38 AM

## 2013-03-23 NOTE — Progress Notes (Signed)
[facility] PROGRESS NOTE  Nimra Puccinelli ZOX:[id number] DOB: 1991-01-08 DOA: 03/20/2013 PCP: Ellin Saba  Brief narrative [name] Small is a 22 y.o. female who presents to the ED at [facility] with an infection of a facial piercing infection, cellulitis, and suspected abscess of the R cheek. Piercing was performed about 4 months ago, was asymptomatic until 3 days ago when she developed pain in this area. Then rapidly developed swelling, erythema, and is now TTP. Had fever at [facility] she says though this is not documented objectively. Unable to remove piercing due to the inside of her cheek swelling over the metal backing. She was transferred to [facility] for ENT evaluation to remove the piercing after being given vancomycin. After arrival ENT said they would see the patient in the AM and have asked that the patient be admitted to the medical service.   Assessment/Plan: 1. Right cheek abscess/cellulitis due to infected facial piercing: Status post I&D by ENT. Continue antibiotics (oral clindamycin and IV vancomycin across HD) and pain management. Improving. Await ENT followup. Wound culture-multiple organisms, none predominant but no staph aureus or group A strep isolated. Consider switching to oral antibiotics (? Augmentin) and discharge on 6/2 pending ENT followup. 2. Uncontrolled hypertension: Likely precipitated by pain from problem #1. Increased ramipril to 5 mg twice a day. Continue Coreg. Patient claims compliance meds at home. 3. ESRD: TTS dialysis. History of repeatedly missing outpatient dialysis. Nephrology input appreciated. HD completed on 5/31. 4. Chronic anemia: Likely secondary to ESRD. Management per nephrology. Hemoglobin 6/1: 7.7 g per DL-stable 5. Medication noncompliance. Counseled.  Code Status: Full Family Communication: None Disposition Plan: Possible home on 6/2, pending ENT clearance   Consultants:  ENT  Nephrology  Procedures:  Incision and drainage of a right  cheek abscess and removal of facial piercing on 5/30.  Antibiotics:  Clindamycin   IV vancomycin  HPI/Subjective: Right cheek pain and swelling continued to improve. Heartburn. Fullness underneath chin area without pain.   Objective: Filed Vitals:   03/22/13 1511 03/22/13 1622 03/22/13 2234 03/23/13 0500  BP: 169/109 171/103 143/92 168/112  Pulse: 84 90 90 84  Temp: 98.2 F (36.8 C) 99.5 F (37.5 C) 98.9 F (37.2 C) 98.5 F (36.9 C)  TempSrc: Oral     Resp: 16 16 16 16   Height:      Weight: 69.6 kg (153 lb 7 oz)     SpO2: 100% 99% 98% 100%    Intake/Output Summary (Last 24 hours) at 03/23/13 1415 Last data filed at 03/22/13 1511  Gross per 24 hour  Intake      0 ml  Output   3000 ml  Net  -3000 ml   Filed Weights   03/20/13 1752 03/22/13 1105 03/22/13 1511  Weight: 69.9 kg (154 lb 1.6 oz) 72.9 kg (160 lb 11.5 oz) 69.6 kg (153 lb 7 oz)    Exam:   General exam: Comfortable. Obese.  Respiratory system: Clear. No increased work of breathing.  Cardiovascular system: S1 & S2 heard, RRR. No JVD, murmurs, gallops, clicks or pedal edema.   Gastrointestinal system: Abdomen is nondistended, soft and nontender. Normal bowel sounds heard.  Central nervous system: Alert and oriented. No focal neurological deficits.  Extremities: Symmetric 5 x 5 power.  Head, eyes and ENT: Right maxillary area diffusely swollen, warm and tender-continues to improve. Some fullness in the submental area but without any acute signs.  Data Reviewed: Basic Metabolic Panel:  Recent Labs Lab 03/20/13 1850 03/21/13 0500 03/22/13 1110  NA 141 138 136  K 4.1 4.3 4.8  CL 95* 95* 96  CO2 28 28 26   GLUCOSE 79 74 104*  BUN 27* 29* 39*  CREATININE 10.10* 10.54* 13.04*  CALCIUM 10.3 10.3 9.6  PHOS  --   --  8.0*   Liver Function Tests:  Recent Labs Lab 03/20/13 1850 03/22/13 1110  AST 13  --   ALT 11  --   ALKPHOS 66  --   BILITOT 0.3  --   PROT 8.2  --   ALBUMIN 3.8 3.0*   No  results found for this basename: LIPASE, AMYLASE,  in the last 168 hours No results found for this basename: AMMONIA,  in the last 168 hours CBC:  Recent Labs Lab 03/20/13 1850 03/21/13 0500 03/22/13 1110 03/23/13 1000  WBC 8.5 7.5 5.4 5.9  NEUTROABS 6.5  --   --   --   HGB 8.3* 8.6* 7.0* 7.7*  HCT 25.2* 26.4* 21.3* 24.0*  MCV 90.0 91.3 91.0 92.7  PLT 220 213 187 208   Cardiac Enzymes: No results found for this basename: CKTOTAL, CKMB, CKMBINDEX, TROPONINI,  in the last 168 hours BNP (last 3 results) No results found for this basename: PROBNP,  in the last 8760 hours CBG:  Recent Labs Lab 03/21/13 1340  GLUCAP 81    Recent Results (from the past 240 hour(s))  SURGICAL PCR SCREEN     Status: Abnormal   Collection Time    03/21/13  8:23 AM      Result Value Range Status   MRSA, PCR POSITIVE (*) NEGATIVE Final   Staphylococcus aureus POSITIVE (*) NEGATIVE Final   Comment:            The Xpert SA Assay (FDA     approved for NASAL specimens     in patients over 72 years of age),     is one component of     a comprehensive surveillance     program.  Test performance has     been validated by [employer] for patients greater     than or equal to 26 year old.     It is not intended     to diagnose infection nor to     guide or monitor treatment.  WOUND CULTURE     Status: None   Collection Time    03/21/13  1:20 PM      Result Value Range Status   Specimen Description WOUND FACE RIGHT   Final   Special Requests RIGHT CHEEK   Final   Gram Stain     Final   Value: RARE WBC PRESENT, PREDOMINANTLY PMN     NO SQUAMOUS EPITHELIAL CELLS SEEN     RARE GRAM POSITIVE COCCI     IN PAIRS   Culture     Final   Value: MULTIPLE ORGANISMS PRESENT, NONE PREDOMINANT NO STAPHYLOCOCCUS AUREUS ISOLATED NO GROUP A STREP (S.PYOGENES) ISOLATED   Report Status 03/23/2013 FINAL   Final     Studies: No results found.   Additional labs:   Scheduled Meds: . calcium acetate   1,334 mg Oral TID WC  . carvedilol  3.125 mg Oral BID WC  . Chlorhexidine Gluconate Cloth  6 each Topical Q0600  . clindamycin  300 mg Oral Q6H  . darbepoetin (ARANESP) injection - DIALYSIS  60 mcg Intravenous Q Sat-HD  . doxercalciferol  8 mcg Intravenous Q T,Th,Sa-HD  . famotidine  20  mg Oral Daily  . feeding supplement (NEPRO CARB STEADY)  237 mL Oral BID BM  . ferric gluconate (FERRLECIT/NULECIT) IV  125 mg Intravenous Q T,Th,Sa-HD  . multivitamin  1 tablet Oral QHS  . mupirocin ointment  1 application Nasal BID  . ramipril  5 mg Oral BID  . sodium chloride  3 mL Intravenous Q12H  . vancomycin  750 mg Intravenous Q T,Th,Sa-HD   Continuous Infusions: . sodium chloride 20 mL/hr at 03/22/13 1629    Principal Problem:   Facial cellulitis & abscess Active Problems:   ESRD (end stage renal disease)   HTN (hypertension)   Anemia    Time spent: 25 mins    [facility]  [facility] Pager [phone].   If 8PM-8AM, please contact night-coverage at [email or web address], password [facility] 03/23/2013, 2:15 PM  LOS: 3 days

## 2013-03-24 ENCOUNTER — Encounter (HOSPITAL_COMMUNITY): Payer: Self-pay | Admitting: General Practice

## 2013-03-24 LAB — CBC
HCT: 23.4 % — ABNORMAL LOW (ref 36.0–46.0)
MCH: 29.8 pg (ref 26.0–34.0)
MCV: 91.8 fL (ref 78.0–100.0)
RDW: 13.8 % (ref 11.5–15.5)
WBC: 7.1 10*3/uL (ref 4.0–10.5)

## 2013-03-24 LAB — RENAL FUNCTION PANEL
Albumin: 3.2 g/dL — ABNORMAL LOW (ref 3.5–5.2)
BUN: 20 mg/dL (ref 6–23)
CO2: 25 mEq/L (ref 19–32)
Calcium: 9.9 mg/dL (ref 8.4–10.5)
Chloride: 95 mEq/L — ABNORMAL LOW (ref 96–112)
Creatinine, Ser: 9.45 mg/dL — ABNORMAL HIGH (ref 0.50–1.10)

## 2013-03-24 MED ORDER — AMOXICILLIN-POT CLAVULANATE 500-125 MG PO TABS
1.0000 | ORAL_TABLET | ORAL | Status: DC
  Administered 2013-03-24: 500 mg via ORAL
  Filled 2013-03-24 (×2): qty 1

## 2013-03-24 MED ORDER — HYDROMORPHONE HCL 2 MG PO TABS: 2.0000 mg | ORAL_TABLET | Freq: Four times a day (QID) | ORAL | Status: DC | PRN

## 2013-03-24 MED ORDER — FAMOTIDINE 20 MG PO TABS: 20.0000 mg | ORAL_TABLET | Freq: Every day | ORAL | Status: DC

## 2013-03-24 MED ORDER — DIPHENHYDRAMINE HCL 25 MG PO CAPS: 25.0000 mg | ORAL_CAPSULE | Freq: Four times a day (QID) | ORAL | Status: DC | PRN

## 2013-03-24 MED ORDER — RAMIPRIL 5 MG PO TABS: 5.0000 mg | ORAL_TABLET | Freq: Two times a day (BID) | ORAL | Status: DC

## 2013-03-24 MED ORDER — AMOXICILLIN-POT CLAVULANATE 500-125 MG PO TABS: 1.0000 | ORAL_TABLET | ORAL | Status: DC

## 2013-03-24 NOTE — Progress Notes (Signed)
Subjective:  Complains of heartburn, right jaw soreness, no other complaints  Objective: Vital signs in last 24 hours: Temp:  [98.6 F (37 C)-98.7 F (37.1 C)] 98.6 F (37 C) (06/02 0549) Pulse Rate:  [81-93] 82 (06/02 0549) Resp:  [18] 18 (06/01 2136) BP: (144-170)/(88-116) 158/104 mmHg (06/02 0549) SpO2:  [96 %-100 %] 96 % (06/02 0549) Weight change:      EXAM: General appearance:  Right cheek swelling, alert, in no apparent distress Resp:  CTA without rales, rhonchi, or wheezes Cardio:  RRR without murmur or rub GI:  + BS, soft and nontender Extremities:  No edema Access:  AVF @ LUA with + bruit  Lab Results:  Recent Labs  03/23/13 1000 03/24/13 0450  WBC 5.9 7.1  HGB 7.7* 7.6*  HCT 24.0* 23.4*  PLT 208 230   BMET:  Recent Labs  03/22/13 1110 03/24/13 0450  NA 136 135  K 4.8 4.6  CL 96 95*  CO2 26 25  GLUCOSE 104* 80  BUN 39* 20  CREATININE 13.04* 9.45*  CALCIUM 9.6 9.9  ALBUMIN 3.0* 3.2*   No results found for this basename: PTH,  in the last 72 hours Iron Studies: No results found for this basename: IRON, TIBC, TRANSFERRIN, FERRITIN,  in the last 72 hours  Dialysis Orders: Center: [facility] TTS 3.75 hr Optiflux 180 2 K 2.25 left upper AVF 400/A 1.5 no heparin EDW 69.5 Epo 3600 hectorol 8   Assessment/Plan: 1. Right cheek abscess - secondary to infected facial piercing, s/p I & D 5/30 with removal of piercing; on Vancomycin and Clindamycin. 2. ESRD - HD on TTS @ [facility], but given 30-day termination notice 5/30 from [facility] secondary to repeated noncompliance with HD; K 4.6.  HD tomorrow.  3. HTN/Volume - BP 158/104 on Coreg3.125 mg bid and Altace 5 mg bid; wt 69.6 kg post-HD 5/31, EDW 69.5. 4. Anemia - Hgb 7.6, secondary to missed Txs, on Aranesp 60 mcg on Sat, Fe per HD. 5. Secondary hyperparathyroidism - Ca 9.9 (10.5 corrected), P 5.5; Hectorol 8 mcg, Phoslo 2 with meals. 6. Nutrition - Alb 3.2, renal diet, vitamin. 7. Medical noncompliance - plans to  pursue other HD locations after dismissal from [facility]. 8. MRSA PCR + - contact precautions.   LOS: 4 days   LYLES,CHARLES 03/24/2013,9:41 AM  Patient seen and examined.  Agree with assessment and plan as above. She is anemic but is young and asymptomatic, so don't think she needs transfusion. OK for discharge today as per primary MD plans.  She rec'd Aranesp x 1 here, and will continue to get EPO at outpt HD center, but noncompliance with HD sessions means she is not getting all the EPO that she should be and this is probably why she is anemic (though of course there could be other reasons).  Vinson Moselle  MD [phone] pgr    [phone] cell 03/24/2013, 1:26 PM

## 2013-03-24 NOTE — Discharge Summary (Signed)
Physician Discharge Summary  [name] Small ZOX:[id number] DOB: 16-Sep-1991 DOA: 03/20/2013  PCP: Ellin Saba  Admit date: 03/20/2013 Discharge date: 03/24/2013  Time spent: Greater than 30 minutes  Recommendations for Outpatient Follow-up:  1. Dr. Serena Colonel, ENT as needed 2. Dr. Gwendolyn Fill, PCP in 3 days. 3. Hemodialysis Center: Keep scheduled regular hemodialysis appointments on Tuesdays, Thursdays and Saturdays.  Discharge Diagnoses:  Principal Problem:   Facial cellulitis & abscess Active Problems:   ESRD (end stage renal disease)   HTN (hypertension)   Anemia   Discharge Condition: Improved & Stable  Diet recommendation: Renal diet  Filed Weights   03/20/13 1752 03/22/13 1105 03/22/13 1511  Weight: 69.9 kg (154 lb 1.6 oz) 72.9 kg (160 lb 11.5 oz) 69.6 kg (153 lb 7 oz)    History of present illness:  [name] Small is a 22 y.o. female who presented to the ED at [facility] with an infection of a facial piercing site, cellulitis, and suspected abscess of the R cheek. Piercing was performed about 4 months ago, was asymptomatic until 3 days ago when she developed pain in this area. Then rapidly developed swelling, erythema, and is now TTP. Had fever at [facility] she says though this is not documented objectively. Unable to remove piercing due to the inside of her cheek swelling over the metal backing. She was transferred to [facility] for ENT evaluation to remove the piercing after being given vancomycin.    Hospital Course:  1. Right cheek abscess/cellulitis due to infected facial piercing: Started on empiric PO Clindamycin and IV Vancomycin. ENT consulted and performed I&D. Wound culture-multiple organisms, none predominant but no staph aureus or group A strep isolated. She continued to improve. ENT followed up and recommended DC on additional 2 weeks of Augmentin.  2. Uncontrolled hypertension: Likely precipitated by pain from problem #1. Increased ramipril to 5 mg twice a day. Continue  Coreg. Patient claims compliance meds at home. Will need OP FU & Mx.  3. ESRD: TTS dialysis. History of repeatedly missing outpatient dialysis. Nephrology consulted and continued IP HD. Counseled regarding compliance to OP HD- she verbalized understanding. 4. Chronic anemia: Likely secondary to ESRD. Management per nephrology. Stable. OP FU.  5. Medication noncompliance. Counseled.   Procedures:  Incision and drainage of a right cheek abscess and removal of facial piercing on 5/30.   Hemodialysis  Consultations:  ENT  Nephrology  Discharge Exam:  Complaints: Improving pain and swelling of right cheek-rates pain as 4-5/10 and intermittent. Some swelling underneath the chin. No difficulty swallowing or breathing.  Filed Vitals:   03/23/13 2136 03/23/13 2145 03/23/13 2336 03/24/13 0549  BP: 170/116 162/88 144/88 158/104  Pulse: 93   82  Temp: 98.6 F (37 C)   98.6 F (37 C)  TempSrc:      Resp: 18     Height:      Weight:      SpO2: 100%   96%     General exam: Comfortable. Obese.   Respiratory system: Clear. No increased work of breathing.   Cardiovascular system: S1 & S2 heard, RRR. No JVD, murmurs, gallops, clicks or pedal edema.   Gastrointestinal system: Abdomen is nondistended, soft and nontender. Normal bowel sounds heard.   Central nervous system: Alert and oriented. No focal neurological deficits.   Extremities: Symmetric 5 x 5 power.   Head, eyes and ENT: Right maxillary area diffusely swollen, warm and tender-continues to improve. Some fullness in the submental area but without any acute signs  Discharge Instructions      Discharge Orders   Future Orders Complete By Expires     Activity as tolerated - No restrictions  As directed     Call MD for:  extreme fatigue  As directed     Call MD for:  persistant dizziness or light-headedness  As directed     Call MD for:  redness, tenderness, or signs of infection (pain, swelling, redness, odor or  green/yellow discharge around incision site)  As directed     Call MD for:  severe uncontrolled pain  As directed     Call MD for:  temperature >100.4  As directed     Discharge instructions  As directed     Comments:      DIET: Renal diet.        Medication List    TAKE these medications       amoxicillin-clavulanate 500-125 MG per tablet  Commonly known as:  AUGMENTIN  Take 1 tablet (500 mg total) by mouth daily.     calcium acetate 667 MG capsule  Commonly known as:  PHOSLO  Take 1,334 mg by mouth 3 (three) times daily with meals.     carvedilol 3.125 MG tablet  Commonly known as:  COREG  Take 3.125 mg by mouth 2 (two) times daily with a meal.     diphenhydrAMINE 25 mg capsule  Commonly known as:  BENADRYL  Take 1 capsule (25 mg total) by mouth every 6 (six) hours as needed for itching.     famotidine 20 MG tablet  Commonly known as:  PEPCID  Take 1 tablet (20 mg total) by mouth daily.     HYDROmorphone 2 MG tablet  Commonly known as:  DILAUDID  Take 1 tablet (2 mg total) by mouth every 6 (six) hours as needed for pain.     multivitamin Tabs tablet  Take 1 tablet by mouth every morning.     ondansetron 4 MG disintegrating tablet  Commonly known as:  ZOFRAN ODT  Take 1 tablet (4 mg total) by mouth every 8 (eight) hours as needed for nausea.     ramipril 5 MG tablet  Commonly known as:  ALTACE  Take 1 tablet (5 mg total) by mouth 2 (two) times daily.       Follow-up Information   Follow up with Serena Colonel, MD. (As needed if symptoms worsen)    Contact information:   [address] [place] [address] [phone]       Follow up with Ellin Saba. Schedule an appointment as soon as possible for a visit in 3 days.   Contact information:   - - - - [phone]       Follow up with Hemodialysis Center. (Keep regular apointments on Tues/Thurs/Sats.)        The results of significant diagnostics  from this hospitalization (including imaging, microbiology, ancillary and laboratory) are listed below for reference.    Significant Diagnostic Studies: No results found.  Microbiology: Recent Results (from the past 240 hour(s))  SURGICAL PCR SCREEN     Status: Abnormal   Collection Time    03/21/13  8:23 AM      Result Value Range Status   MRSA, PCR POSITIVE (*) NEGATIVE Final   Staphylococcus aureus POSITIVE (*) NEGATIVE Final   Comment:            The Xpert SA Assay (FDA  approved for NASAL specimens     in patients over 38 years of age),     is one component of     a comprehensive surveillance     program.  Test performance has     been validated by [employer] for patients greater     than or equal to 62 year old.     It is not intended     to diagnose infection nor to     guide or monitor treatment.  WOUND CULTURE     Status: None   Collection Time    03/21/13  1:20 PM      Result Value Range Status   Specimen Description WOUND FACE RIGHT   Final   Special Requests RIGHT CHEEK   Final   Gram Stain     Final   Value: RARE WBC PRESENT, PREDOMINANTLY PMN     NO SQUAMOUS EPITHELIAL CELLS SEEN     RARE GRAM POSITIVE COCCI     IN PAIRS   Culture     Final   Value: MULTIPLE ORGANISMS PRESENT, NONE PREDOMINANT NO STAPHYLOCOCCUS AUREUS ISOLATED NO GROUP A STREP (S.PYOGENES) ISOLATED   Report Status 03/23/2013 FINAL   Final     Labs: Basic Metabolic Panel:  Recent Labs Lab 03/20/13 1850 03/21/13 0500 03/22/13 1110 03/24/13 0450  NA 141 138 136 135  K 4.1 4.3 4.8 4.6  CL 95* 95* 96 95*  CO2 28 28 26 25   GLUCOSE 79 74 104* 80  BUN 27* 29* 39* 20  CREATININE 10.10* 10.54* 13.04* 9.45*  CALCIUM 10.3 10.3 9.6 9.9  PHOS  --   --  8.0* 5.5*   Liver Function Tests:  Recent Labs Lab 03/20/13 1850 03/22/13 1110 03/24/13 0450  AST 13  --   --   ALT 11  --   --   ALKPHOS 66  --   --   BILITOT 0.3  --   --   PROT 8.2  --   --   ALBUMIN 3.8 3.0* 3.2*    No results found for this basename: LIPASE, AMYLASE,  in the last 168 hours No results found for this basename: AMMONIA,  in the last 168 hours CBC:  Recent Labs Lab 03/20/13 1850 03/21/13 0500 03/22/13 1110 03/23/13 1000 03/24/13 0450  WBC 8.5 7.5 5.4 5.9 7.1  NEUTROABS 6.5  --   --   --   --   HGB 8.3* 8.6* 7.0* 7.7* 7.6*  HCT 25.2* 26.4* 21.3* 24.0* 23.4*  MCV 90.0 91.3 91.0 92.7 91.8  PLT 220 213 187 208 230   Cardiac Enzymes: No results found for this basename: CKTOTAL, CKMB, CKMBINDEX, TROPONINI,  in the last 168 hours BNP: BNP (last 3 results) No results found for this basename: PROBNP,  in the last 8760 hours CBG:  Recent Labs Lab 03/21/13 1340  GLUCAP 81    Additional labs:    Signed:  Jahzion Brogden  [facility] 03/24/2013, 1:47 PM

## 2013-03-24 NOTE — Progress Notes (Addendum)
Patient discharged to home accompanied by family. Discharge instructions and rx given and explained and patient stated understanding. Patient's IV was removed prior to discharge. Patient left unit in a stable condition.

## 2013-03-24 NOTE — Progress Notes (Signed)
ANTIBIOTIC CONSULT NOTE -  Pharmacy Consult for vancomycin  Indication: cellulitis  Allergies  Allergen Reactions  . Tramadol Anaphylaxis  . Morphine And Related Itching    Ok with oxycodone  . Vicodin (Hydrocodone-Acetaminophen) Other (See Comments)    unknown    Patient Measurements: Height: 5\' 3"  (160 cm) Weight: 153 lb 7 oz (69.6 kg) (standing) IBW/kg (Calculated) : 52.4  Vital Signs: Temp: 98.6 F (37 C) (06/02 0549) BP: 158/104 mmHg (06/02 0549) Pulse Rate: 82 (06/02 0549) Intake/Output from previous day:   Intake/Output from this shift:    Labs:  Recent Labs  03/22/13 1110 03/23/13 1000 03/24/13 0450  WBC 5.4 5.9 7.1  HGB 7.0* 7.7* 7.6*  PLT 187 208 230  CREATININE 13.04*  --  9.45*   Estimated Creatinine Clearance: 8.8 ml/min (by C-G formula based on Cr of 9.45). No results found for this basename: VANCOTROUGH, Leodis Binet, VANCORANDOM, GENTTROUGH, GENTPEAK, GENTRANDOM, TOBRATROUGH, TOBRAPEAK, TOBRARND, AMIKACINPEAK, AMIKACINTROU, AMIKACIN,  in the last 72 hours   Microbiology: Recent Results (from the past 720 hour(s))  SURGICAL PCR SCREEN     Status: Abnormal   Collection Time    03/21/13  8:23 AM      Result Value Range Status   MRSA, PCR POSITIVE (*) NEGATIVE Final   Staphylococcus aureus POSITIVE (*) NEGATIVE Final   Comment:            The Xpert SA Assay (FDA     approved for NASAL specimens     in patients over 37 years of age),     is one component of     a comprehensive surveillance     program.  Test performance has     been validated by [employer] for patients greater     than or equal to 42 year old.     It is not intended     to diagnose infection nor to     guide or monitor treatment.  WOUND CULTURE     Status: None   Collection Time    03/21/13  1:20 PM      Result Value Range Status   Specimen Description WOUND FACE RIGHT   Final   Special Requests RIGHT CHEEK   Final   Gram Stain     Final   Value: RARE WBC PRESENT,  PREDOMINANTLY PMN     NO SQUAMOUS EPITHELIAL CELLS SEEN     RARE GRAM POSITIVE COCCI     IN PAIRS   Culture     Final   Value: MULTIPLE ORGANISMS PRESENT, NONE PREDOMINANT NO STAPHYLOCOCCUS AUREUS ISOLATED NO GROUP A STREP (S.PYOGENES) ISOLATED   Report Status 03/23/2013 FINAL   Final    Medical History: Past Medical History  Diagnosis Date  . Hemodialysis patient   . Hypertension   . Pulmonary emboli 01/2012    Bilateral, moderate clot burden, areas of pulmonary infarction and central necrosis  . CHF (congestive heart failure)   . Cardiomyopathy   . Dysrhythmia     at times per pt.  . Anemia   . H/O transfusion of packed red blood cells   . 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 Polycystic kidney disease     [facility]  T,Th, S  . Cellulitis and abscess of face 03/22/2013    Medications:  Scheduled:  Assessment: 22 yo female with ESRD-HD  TTS presented with SSTI associated with facial piercing.  Goal of Therapy:  Pre-HD vancomycin level 15 - 25 mcg/mL  Plan:  1. Cont vancomycin 750mg  IV Q-HD TTS.   [name] Small 03/24/2013,10:59 AM

## 2013-03-24 NOTE — Progress Notes (Signed)
Doing much better. Swelling significantly reduced compared to pre-op. Culture with mixed flaura. Recommend continue oral Abx for 2 weeks. I can see her as out patient as needed.

## 2013-03-29 LAB — DRUG SCREEN PANEL (SERUM)

## 2013-04-09 ENCOUNTER — Emergency Department (HOSPITAL_BASED_OUTPATIENT_CLINIC_OR_DEPARTMENT_OTHER): Payer: Managed Care, Other (non HMO)

## 2013-04-09 ENCOUNTER — Emergency Department (HOSPITAL_BASED_OUTPATIENT_CLINIC_OR_DEPARTMENT_OTHER)
Admission: EM | Admit: 2013-04-09 | Discharge: 2013-04-09 | Disposition: A | Discharge status: Home / Self Care | Payer: Managed Care, Other (non HMO) | Attending: Emergency Medicine | Admitting: Emergency Medicine

## 2013-04-09 ENCOUNTER — Encounter (HOSPITAL_BASED_OUTPATIENT_CLINIC_OR_DEPARTMENT_OTHER): Payer: Self-pay

## 2013-04-09 DIAGNOSIS — R0602 Shortness of breath: Secondary | ICD-10-CM | POA: Insufficient documentation

## 2013-04-09 DIAGNOSIS — N186 End stage renal disease: Secondary | ICD-10-CM | POA: Insufficient documentation

## 2013-04-09 DIAGNOSIS — Z8679 Personal history of other diseases of the circulatory system: Secondary | ICD-10-CM | POA: Insufficient documentation

## 2013-04-09 DIAGNOSIS — R519 Headache, unspecified: Secondary | ICD-10-CM

## 2013-04-09 DIAGNOSIS — R51 Headache: Secondary | ICD-10-CM | POA: Insufficient documentation

## 2013-04-09 DIAGNOSIS — Z87891 Personal history of nicotine dependence: Secondary | ICD-10-CM | POA: Insufficient documentation

## 2013-04-09 DIAGNOSIS — M546 Pain in thoracic spine: Secondary | ICD-10-CM | POA: Insufficient documentation

## 2013-04-09 DIAGNOSIS — Z862 Personal history of diseases of the blood and blood-forming organs and certain disorders involving the immune mechanism: Secondary | ICD-10-CM | POA: Insufficient documentation

## 2013-04-09 DIAGNOSIS — Q613 Polycystic kidney, unspecified: Secondary | ICD-10-CM | POA: Insufficient documentation

## 2013-04-09 DIAGNOSIS — I10 Essential (primary) hypertension: Secondary | ICD-10-CM

## 2013-04-09 DIAGNOSIS — I509 Heart failure, unspecified: Secondary | ICD-10-CM | POA: Insufficient documentation

## 2013-04-09 DIAGNOSIS — Z940 Kidney transplant status: Secondary | ICD-10-CM | POA: Insufficient documentation

## 2013-04-09 DIAGNOSIS — Z86711 Personal history of pulmonary embolism: Secondary | ICD-10-CM | POA: Insufficient documentation

## 2013-04-09 DIAGNOSIS — Z872 Personal history of diseases of the skin and subcutaneous tissue: Secondary | ICD-10-CM | POA: Insufficient documentation

## 2013-04-09 DIAGNOSIS — I120 Hypertensive chronic kidney disease with stage 5 chronic kidney disease or end stage renal disease: Secondary | ICD-10-CM | POA: Insufficient documentation

## 2013-04-09 DIAGNOSIS — Z905 Acquired absence of kidney: Secondary | ICD-10-CM | POA: Insufficient documentation

## 2013-04-09 DIAGNOSIS — Z992 Dependence on renal dialysis: Secondary | ICD-10-CM

## 2013-04-09 DIAGNOSIS — Z79899 Other long term (current) drug therapy: Secondary | ICD-10-CM | POA: Insufficient documentation

## 2013-04-09 LAB — COMPREHENSIVE METABOLIC PANEL
ALT: 20 U/L (ref 0–35)
AST: 25 U/L (ref 0–37)
Albumin: 3.5 g/dL (ref 3.5–5.2)
Alkaline Phosphatase: 68 U/L (ref 39–117)
Chloride: 96 mEq/L (ref 96–112)
Creatinine, Ser: 10.9 mg/dL — ABNORMAL HIGH (ref 0.50–1.10)
Potassium: 4.6 mEq/L (ref 3.5–5.1)
Sodium: 140 mEq/L (ref 135–145)
Total Bilirubin: 0.3 mg/dL (ref 0.3–1.2)

## 2013-04-09 LAB — CBC WITH DIFFERENTIAL/PLATELET
Basophils Absolute: 0 10*3/uL (ref 0.0–0.1)
Basophils Relative: 1 % (ref 0–1)
MCHC: 32.5 g/dL (ref 30.0–36.0)
Neutro Abs: 3.7 10*3/uL (ref 1.7–7.7)
Neutrophils Relative %: 58 % (ref 43–77)
RDW: 16.1 % — ABNORMAL HIGH (ref 11.5–15.5)
WBC: 6.4 10*3/uL (ref 4.0–10.5)

## 2013-04-09 LAB — TROPONIN I: Troponin I: <0.3 ng/mL (ref <0.30)

## 2013-04-09 MED ORDER — DIPHENHYDRAMINE HCL 25 MG PO CAPS
ORAL_CAPSULE | ORAL | Status: AC
  Administered 2013-04-09: 25 mg via ORAL
  Filled 2013-04-09: qty 1

## 2013-04-09 MED ORDER — DIPHENHYDRAMINE HCL 25 MG PO CAPS: 25.0000 mg | ORAL_CAPSULE | Freq: Once | ORAL | Status: AC

## 2013-04-09 MED ORDER — HYDROMORPHONE HCL PF 2 MG/ML IJ SOLN
INTRAMUSCULAR | Status: AC
  Administered 2013-04-09: 2 mg via INTRAMUSCULAR
  Filled 2013-04-09: qty 1

## 2013-04-09 MED ORDER — HYDROMORPHONE HCL PF 2 MG/ML IJ SOLN: 2.0000 mg | Freq: Once | INTRAMUSCULAR | Status: AC

## 2013-04-09 MED ORDER — CLONIDINE HCL 0.1 MG PO TABS
0.2000 mg | ORAL_TABLET | Freq: Once | ORAL | Status: AC
  Administered 2013-04-09: 0.2 mg via ORAL
  Filled 2013-04-09: qty 2

## 2013-04-09 NOTE — ED Notes (Signed)
Pt states that she cannot provide a urine specimen at this time.  Will check back shortly, however, collection is unlikely because pt is frequently anuric.

## 2013-04-09 NOTE — ED Notes (Signed)
Pt states that she is concerned about her blood pressure, was checking it at home last night and states that it was "extremely high" on her home BP machine.  Pt states that she was awakened last night by shortness of breath and that she also has been experiencing back pain for the past two days.  Pt was just discharged from [facility] for an abscess in her cheek on 5/29.

## 2013-04-09 NOTE — ED Notes (Signed)
Pt states that she needs benadryl for itching.  Pt states that after receiving dilaudid she always needs to take some to relieve this.  Discussed with Dr Judd Lien that we will continue to monitor for rash, swelling, hives, etc and administer benadryl as needed, but due to the increased incidence of somnolence with benadryl and dilaudid concurrently we will hold off giving this at this time unless itching becomes severe.

## 2013-04-09 NOTE — ED Notes (Signed)
Pt states that she cannot provide a urine specimen at this time.

## 2013-04-09 NOTE — ED Provider Notes (Signed)
History     CSN: [id number]  Arrival date & time 04/09/13  7846   First MD Initiated Contact with Patient 04/09/13 [phone]      Chief Complaint  Patient presents with  . Hypertension  . Shortness of Breath  . Back Pain    (Consider location/radiation/quality/duration/timing/severity/associated sxs/prior treatment) HPI Comments: Patient with history of ESRD on HD due to polycystic kidney disease presents with complaints of pain in her upper back, elevated blood pressure since yesterday evening.  She felt short of breath last night but denies fevers, chills, or cough.  She had dialysis yesterday that was a normal length session without any difficulty or problems.  No prior cardiac problems.  Patient is a 22 y.o. female presenting with hypertension. The history is provided by the patient.  Hypertension This is a new problem. The current episode started yesterday. The problem occurs constantly. The problem has not changed since onset.Nothing aggravates the symptoms. Nothing relieves the symptoms. She has tried nothing for the symptoms. The treatment provided no relief.    Past Medical History  Diagnosis Date  . Hemodialysis patient   . Hypertension   . Pulmonary emboli 01/2012    Bilateral, moderate clot burden, areas of pulmonary infarction and central necrosis  . CHF (congestive heart failure)   . Cardiomyopathy   . Dysrhythmia     at times per pt.  . Anemia   . H/O transfusion of packed red blood cells   . 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 Polycystic kidney disease     [facility]  T,Th, S  . Cellulitis and abscess of face 03/22/2013  . Dialysis patient     Past Surgical History  Procedure Laterality Date  . Nephrectomy    . Av fistula placement    . Kidney transplant  2008    failed  . Tonsillectomy      as a child.  . Adenoidectomy    . Incision and  drainage abscess Right 03/21/2013    Procedure: INCISION AND DRAINAGE RIGHT CHEEK ABSCESS REMOVAL OF FOREIGN BODY;  Surgeon: Serena Colonel, MD;  Location: [facility] OR;  Service: ENT;  Laterality: Right;    Family History  Problem Relation Age of Onset  . Polycystic kidney disease Father     History  Substance Use Topics  . Smoking status: Former Smoker -- 0.20 packs/day    Types: Cigarettes    Quit date: 05/06/2012  . Smokeless tobacco: Never Used  . Alcohol Use: No    OB History   Grav Para Term Preterm Abortions TAB SAB Ect Mult Living                  Review of Systems  All other systems reviewed and are negative.    Allergies  Tramadol; Morphine and related; and Vicodin  Home Medications   Current Outpatient Rx  Name  Route  Sig  Dispense  Refill  . calcium acetate (PHOSLO) 667 MG capsule   Oral   Take 1,334 mg by mouth 3 (three) times daily with meals.          . carvedilol (COREG) 3.125 MG tablet   Oral   Take 3.125 mg by mouth 2 (two) times daily with a meal.         . famotidine (PEPCID) 20 MG tablet   Oral   Take 1 tablet (20 mg  total) by mouth daily.   30 tablet   0   . multivitamin (RENA-VIT) TABS tablet   Oral   Take 1 tablet by mouth every morning.         . ondansetron (ZOFRAN ODT) 4 MG disintegrating tablet   Oral   Take 1 tablet (4 mg total) by mouth every 8 (eight) hours as needed for nausea.   20 tablet   0   . amoxicillin-clavulanate (AUGMENTIN) 500-125 MG per tablet   Oral   Take 1 tablet (500 mg total) by mouth daily.   14 tablet   0   . diphenhydrAMINE (BENADRYL) 25 mg capsule   Oral   Take 1 capsule (25 mg total) by mouth every 6 (six) hours as needed for itching.         Marland Kitchen HYDROmorphone (DILAUDID) 2 MG tablet   Oral   Take 1 tablet (2 mg total) by mouth every 6 (six) hours as needed for pain.   15 tablet   0   . ramipril (ALTACE) 5 MG tablet   Oral   Take 1 tablet (5 mg total) by mouth 2 (two) times daily.   60  tablet   0     BP 178/130  Pulse 85  Temp(Src) 98.7 F (37.1 C) (Oral)  SpO2 100%  Physical Exam  Nursing note and vitals reviewed. Constitutional: She is oriented to person, place, and time. She appears well-developed and well-nourished. No distress.  HENT:  Head: Normocephalic and atraumatic.  Neck: Normal range of motion. Neck supple.  Cardiovascular: Normal rate and regular rhythm.  Exam reveals no gallop and no friction rub.   No murmur heard. Pulmonary/Chest: Effort normal and breath sounds normal. No respiratory distress. She has no wheezes.  There is ttp in the upper back between the shoulder blades that is reproducible with palpation.  Abdominal: Soft. Bowel sounds are normal. She exhibits no distension. There is no tenderness.  Musculoskeletal: Normal range of motion.  Neurological: She is alert and oriented to person, place, and time.  Skin: Skin is warm and dry. She is not diaphoretic.    ED Course  Procedures (including critical care time)  Labs Reviewed  TROPONIN I  CBC WITH DIFFERENTIAL  COMPREHENSIVE METABOLIC PANEL  PRO B NATRIURETIC PEPTIDE   No results found.   No diagnosis found.   Date: 04/09/2013  Rate: 83  Rhythm: normal sinus rhythm  QRS Axis: normal  Intervals: normal  ST/T Wave abnormalities: nonspecific T wave changes  Conduction Disutrbances:none  Narrative Interpretation:   Old EKG Reviewed: changes noted    MDM  The patient presents here with back pain and elevated blood pressure.  She has a history of ESRD and is on hemodialysis for polycystic kidney disease.  She was given clonidine and workup was initiated revealing an elevated bnp, chest xray showing cardiomegaly.  EKG shows a sinus rhythm with nonspecific t wave changes, but negative troponin.  The bnp was markedly elevated but there was no evidence on exam of chf and there was no edema.  Her oxygen levels were adequate.  I feel as though she is stable for discharge to be  dialyzed tomorrow as scheduled.  There was no acidosis or elevated potassium and does not appear to be in need of emergent dialysis.          Geoffery Lyons, MD 04/09/13 2113

## 2013-04-09 NOTE — ED Notes (Signed)
IV attempted X2 unsuccessful, RN aware.

## 2013-04-09 NOTE — ED Notes (Signed)
Pt states that she cannot tolerate the itching any more, remains very drowsy.  Dr Judd Lien gave VO for 25mg  benadryl PO once, now.

## 2013-04-09 NOTE — ED Notes (Signed)
Verified with pt that she can take dilaudid for pain despite other narcotic allergies, pt states that she has taken this medication previously with no reaction.

## 2013-04-22 ENCOUNTER — Emergency Department (HOSPITAL_COMMUNITY): Payer: Managed Care, Other (non HMO)

## 2013-04-22 ENCOUNTER — Encounter (HOSPITAL_COMMUNITY): Payer: Self-pay | Admitting: *Deleted

## 2013-04-22 ENCOUNTER — Inpatient Hospital Stay (HOSPITAL_COMMUNITY)
Admission: EM | Admit: 2013-04-22 | Discharge: 2013-05-01 | DRG: 682 | Disposition: A | Discharge status: Home / Self Care | Payer: Managed Care, Other (non HMO) | Attending: Internal Medicine | Admitting: Internal Medicine

## 2013-04-22 DIAGNOSIS — Q613 Polycystic kidney, unspecified: Secondary | ICD-10-CM

## 2013-04-22 DIAGNOSIS — Z91199 Patient's noncompliance with other medical treatment and regimen due to unspecified reason: Secondary | ICD-10-CM

## 2013-04-22 DIAGNOSIS — R0989 Other specified symptoms and signs involving the circulatory and respiratory systems: Secondary | ICD-10-CM

## 2013-04-22 DIAGNOSIS — Z86711 Personal history of pulmonary embolism: Secondary | ICD-10-CM

## 2013-04-22 DIAGNOSIS — I120 Hypertensive chronic kidney disease with stage 5 chronic kidney disease or end stage renal disease: Principal | ICD-10-CM | POA: Diagnosis present

## 2013-04-22 DIAGNOSIS — Z9119 Patient's noncompliance with other medical treatment and regimen: Secondary | ICD-10-CM

## 2013-04-22 DIAGNOSIS — E875 Hyperkalemia: Secondary | ICD-10-CM | POA: Diagnosis present

## 2013-04-22 DIAGNOSIS — D649 Anemia, unspecified: Secondary | ICD-10-CM | POA: Diagnosis not present

## 2013-04-22 DIAGNOSIS — Z91158 Patient's noncompliance with renal dialysis for other reason: Secondary | ICD-10-CM

## 2013-04-22 DIAGNOSIS — I509 Heart failure, unspecified: Secondary | ICD-10-CM | POA: Diagnosis present

## 2013-04-22 DIAGNOSIS — J329 Chronic sinusitis, unspecified: Secondary | ICD-10-CM | POA: Diagnosis present

## 2013-04-22 DIAGNOSIS — N186 End stage renal disease: Secondary | ICD-10-CM

## 2013-04-22 DIAGNOSIS — I10 Essential (primary) hypertension: Secondary | ICD-10-CM

## 2013-04-22 DIAGNOSIS — E8770 Fluid overload, unspecified: Secondary | ICD-10-CM

## 2013-04-22 DIAGNOSIS — I5042 Chronic combined systolic (congestive) and diastolic (congestive) heart failure: Secondary | ICD-10-CM | POA: Diagnosis present

## 2013-04-22 DIAGNOSIS — I428 Other cardiomyopathies: Secondary | ICD-10-CM | POA: Diagnosis present

## 2013-04-22 DIAGNOSIS — Z940 Kidney transplant status: Secondary | ICD-10-CM

## 2013-04-22 DIAGNOSIS — N2581 Secondary hyperparathyroidism of renal origin: Secondary | ICD-10-CM | POA: Diagnosis present

## 2013-04-22 DIAGNOSIS — Z87891 Personal history of nicotine dependence: Secondary | ICD-10-CM

## 2013-04-22 DIAGNOSIS — M899 Disorder of bone, unspecified: Secondary | ICD-10-CM | POA: Diagnosis present

## 2013-04-22 DIAGNOSIS — Z992 Dependence on renal dialysis: Secondary | ICD-10-CM

## 2013-04-22 DIAGNOSIS — E8779 Other fluid overload: Secondary | ICD-10-CM

## 2013-04-22 DIAGNOSIS — Z9115 Patient's noncompliance with renal dialysis: Secondary | ICD-10-CM

## 2013-04-22 LAB — COMPREHENSIVE METABOLIC PANEL
Albumin: 3.7 g/dL (ref 3.5–5.2)
BUN: 85 mg/dL — ABNORMAL HIGH (ref 6–23)
CO2: 21 mEq/L (ref 19–32)
Calcium: 9.7 mg/dL (ref 8.4–10.5)
Chloride: 93 mEq/L — ABNORMAL LOW (ref 96–112)
Creatinine, Ser: 18.17 mg/dL — ABNORMAL HIGH (ref 0.50–1.10)
GFR calc non Af Amer: 2 mL/min — ABNORMAL LOW (ref >90)
Total Bilirubin: 0.3 mg/dL (ref 0.3–1.2)

## 2013-04-22 LAB — CBC
HCT: 32 % — ABNORMAL LOW (ref 36.0–46.0)
MCH: 29.6 pg (ref 26.0–34.0)
MCV: 90.1 fL (ref 78.0–100.0)
Platelets: 233 10*3/uL (ref 150–400)
RDW: 16.2 % — ABNORMAL HIGH (ref 11.5–15.5)
WBC: 5.1 10*3/uL (ref 4.0–10.5)

## 2013-04-22 LAB — POCT I-STAT TROPONIN I: Troponin i, poc: 0.03 ng/mL (ref 0.00–0.08)

## 2013-04-22 MED ORDER — CALCIUM ACETATE 667 MG PO CAPS
1334.0000 mg | ORAL_CAPSULE | Freq: Three times a day (TID) | ORAL | Status: DC
  Administered 2013-04-23 – 2013-04-30 (×20): 1334 mg via ORAL
  Filled 2013-04-22 (×28): qty 2

## 2013-04-22 MED ORDER — SEVELAMER CARBONATE 800 MG PO TABS
800.0000 mg | ORAL_TABLET | Freq: Three times a day (TID) | ORAL | Status: DC
  Administered 2013-04-23 – 2013-04-24 (×5): 800 mg via ORAL
  Filled 2013-04-22 (×10): qty 1

## 2013-04-22 MED ORDER — FAMOTIDINE 20 MG PO TABS
20.0000 mg | ORAL_TABLET | Freq: Every day | ORAL | Status: DC
  Administered 2013-04-22 – 2013-04-30 (×9): 20 mg via ORAL
  Filled 2013-04-22 (×11): qty 1

## 2013-04-22 MED ORDER — RAMIPRIL 5 MG PO CAPS
5.0000 mg | ORAL_CAPSULE | Freq: Two times a day (BID) | ORAL | Status: DC
  Administered 2013-04-22 – 2013-05-01 (×15): 5 mg via ORAL
  Filled 2013-04-22 (×20): qty 1

## 2013-04-22 MED ORDER — AMLODIPINE BESYLATE 5 MG PO TABS
5.0000 mg | ORAL_TABLET | Freq: Every day | ORAL | Status: DC
  Administered 2013-04-22 – 2013-05-01 (×9): 5 mg via ORAL
  Filled 2013-04-22 (×10): qty 1

## 2013-04-22 MED ORDER — CALCIUM ACETATE 667 MG PO CAPS
667.0000 mg | ORAL_CAPSULE | ORAL | Status: DC | PRN
  Filled 2013-04-22: qty 1

## 2013-04-22 MED ORDER — ENOXAPARIN SODIUM 30 MG/0.3ML ~~LOC~~ SOLN
30.0000 mg | SUBCUTANEOUS | Status: DC
  Administered 2013-04-22 – 2013-04-30 (×9): 30 mg via SUBCUTANEOUS
  Filled 2013-04-22 (×10): qty 0.3

## 2013-04-22 MED ORDER — SODIUM POLYSTYRENE SULFONATE 15 GM/60ML PO SUSP
30.0000 g | Freq: Once | ORAL | Status: AC
  Administered 2013-04-22: 30 g via ORAL
  Filled 2013-04-22: qty 120

## 2013-04-22 MED ORDER — DIPHENHYDRAMINE HCL 25 MG PO CAPS
25.0000 mg | ORAL_CAPSULE | Freq: Four times a day (QID) | ORAL | Status: AC | PRN
  Administered 2013-04-22: 25 mg via ORAL
  Filled 2013-04-22: qty 1

## 2013-04-22 MED ORDER — ONDANSETRON 4 MG PO TBDP
4.0000 mg | ORAL_TABLET | Freq: Three times a day (TID) | ORAL | Status: DC | PRN
  Filled 2013-04-22: qty 1

## 2013-04-22 MED ORDER — ACETAMINOPHEN 650 MG RE SUPP: 650.0000 mg | Freq: Four times a day (QID) | RECTAL | Status: DC | PRN

## 2013-04-22 MED ORDER — ONDANSETRON HCL 4 MG/2ML IJ SOLN
4.0000 mg | Freq: Three times a day (TID) | INTRAMUSCULAR | Status: DC | PRN
  Administered 2013-04-22: 4 mg via INTRAVENOUS
  Filled 2013-04-22: qty 2

## 2013-04-22 MED ORDER — RENA-VITE PO TABS
1.0000 | ORAL_TABLET | Freq: Every morning | ORAL | Status: DC
  Administered 2013-04-23 – 2013-04-29 (×7): 1 via ORAL
  Filled 2013-04-22 (×9): qty 1

## 2013-04-22 MED ORDER — SODIUM CHLORIDE 0.9 % IJ SOLN
3.0000 mL | Freq: Two times a day (BID) | INTRAMUSCULAR | Status: DC
  Administered 2013-04-23 – 2013-04-30 (×14): 3 mL via INTRAVENOUS

## 2013-04-22 MED ORDER — ACETAMINOPHEN 325 MG PO TABS
650.0000 mg | ORAL_TABLET | Freq: Four times a day (QID) | ORAL | Status: DC | PRN
  Administered 2013-04-23 – 2013-04-24 (×2): 650 mg via ORAL
  Filled 2013-04-22 (×2): qty 2

## 2013-04-22 MED ORDER — ONDANSETRON HCL 4 MG PO TABS: 4.0000 mg | ORAL_TABLET | Freq: Four times a day (QID) | ORAL | Status: DC | PRN

## 2013-04-22 MED ORDER — SODIUM CHLORIDE 0.9 % IJ SOLN
3.0000 mL | Freq: Two times a day (BID) | INTRAMUSCULAR | Status: DC
  Administered 2013-04-22 – 2013-04-30 (×16): 3 mL via INTRAVENOUS

## 2013-04-22 MED ORDER — HYDROMORPHONE HCL PF 1 MG/ML IJ SOLN
1.0000 mg | INTRAMUSCULAR | Status: DC | PRN
  Administered 2013-04-22: 1 mg via INTRAVENOUS
  Filled 2013-04-22: qty 1

## 2013-04-22 MED ORDER — ONDANSETRON HCL 4 MG/2ML IJ SOLN: 4.0000 mg | Freq: Four times a day (QID) | INTRAMUSCULAR | Status: DC | PRN

## 2013-04-22 MED ORDER — CARVEDILOL 3.125 MG PO TABS
3.1250 mg | ORAL_TABLET | Freq: Two times a day (BID) | ORAL | Status: DC
  Administered 2013-04-23 – 2013-04-24 (×2): 3.125 mg via ORAL
  Filled 2013-04-22 (×7): qty 1

## 2013-04-22 NOTE — ED Provider Notes (Signed)
History    CSN: [id number] Arrival date & time 04/22/13  1730  First MD Initiated Contact with Patient 04/22/13 1747     No chief complaint on file.  (Consider location/radiation/quality/duration/timing/severity/associated sxs/prior Treatment) The history is provided by the patient and medical records.   Patient is to the ED for hemodialysis. Patient has a history of polycystic kidney disease, currently on hemodialysis 3 times weekly, Tuesday, Thursday, and Saturday but has missed her last 3 appts.  Patient was receiving dialysis at [facility], but states was dismissed from the practice yesterday (04/21/13) due to noncompliance. States she was supposed to be set up with [facility] but this has not been completed yet.  Patient notes at times he does continue to make small amounts of urine, none for the past 2 days. Now with shortness of breath, generalized chest pain and pressure, abdominal pain and distention.  Prior instances of similar symptoms requiring urgent dialysis.  Medical records reviewed and discussion with on call nephrologist-- Advised that pt went to her last dialysis tx on 04/17/13 but left early- told tx team at dialysis center that she would not be returning for dialysis over the weekend because it was her birthday.  Pt also was given 30 day notice on 03/20/13 by nephrology that she needed to find a new dialysis center and told staff that she would do so.  Past Medical History  Diagnosis Date  . Hemodialysis patient   . Hypertension   . Pulmonary emboli 01/2012    Bilateral, moderate clot burden, areas of pulmonary infarction and central necrosis  . CHF (congestive heart failure)   . Cardiomyopathy   . Dysrhythmia     at times per pt.  . Anemia   . H/O transfusion of packed red blood cells   . 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 Polycystic kidney  disease     [facility]  T,Th, S  . Cellulitis and abscess of face 03/22/2013  . Dialysis patient    Past Surgical History  Procedure Laterality Date  . Nephrectomy    . Av fistula placement    . Kidney transplant  2008    failed  . Tonsillectomy      as a child.  . Adenoidectomy    . Incision and drainage abscess Right 03/21/2013    Procedure: INCISION AND DRAINAGE RIGHT CHEEK ABSCESS REMOVAL OF FOREIGN BODY;  Surgeon: Serena Colonel, MD;  Location: [facility] OR;  Service: ENT;  Laterality: Right;   Family History  Problem Relation Age of Onset  . Polycystic kidney disease Father    History  Substance Use Topics  . Smoking status: Former Smoker -- 0.20 packs/day    Types: Cigarettes    Quit date: 05/06/2012  . Smokeless tobacco: Never Used  . Alcohol Use: No   OB History   Grav Para Term Preterm Abortions TAB SAB Ect Mult Living                 Review of Systems  Respiratory: Positive for shortness of breath.   Cardiovascular: Positive for chest pain.  Gastrointestinal: Positive for abdominal distention.  All other systems reviewed and are negative.    Allergies  Tramadol; Morphine and related; and Vicodin  Home Medications   Current Outpatient Rx  Name  Route  Sig  Dispense  Refill  . amoxicillin-clavulanate (AUGMENTIN) 500-125 MG  per tablet   Oral   Take 1 tablet (500 mg total) by mouth daily.   14 tablet   0   . calcium acetate (PHOSLO) 667 MG capsule   Oral   Take 1,334 mg by mouth 3 (three) times daily with meals.          . carvedilol (COREG) 3.125 MG tablet   Oral   Take 3.125 mg by mouth 2 (two) times daily with a meal.         . diphenhydrAMINE (BENADRYL) 25 mg capsule   Oral   Take 1 capsule (25 mg total) by mouth every 6 (six) hours as needed for itching.         . famotidine (PEPCID) 20 MG tablet   Oral   Take 1 tablet (20 mg total) by mouth daily.   30 tablet   0   . HYDROmorphone (DILAUDID) 2 MG tablet   Oral   Take 1  tablet (2 mg total) by mouth every 6 (six) hours as needed for pain.   15 tablet   0   . multivitamin (RENA-VIT) TABS tablet   Oral   Take 1 tablet by mouth every morning.         . ondansetron (ZOFRAN ODT) 4 MG disintegrating tablet   Oral   Take 1 tablet (4 mg total) by mouth every 8 (eight) hours as needed for nausea.   20 tablet   0   . ramipril (ALTACE) 5 MG tablet   Oral   Take 1 tablet (5 mg total) by mouth 2 (two) times daily.   60 tablet   0    BP 152/102  Pulse 95  Temp(Src) 98.6 F (37 C) (Oral)  Resp 16  SpO2 100%  Physical Exam  Nursing note and vitals reviewed. Constitutional: She is oriented to person, place, and time. She appears well-developed and well-nourished. No distress.  HENT:  Head: Normocephalic and atraumatic.  Mouth/Throat: Oropharynx is clear and moist.  Eyes: Conjunctivae and EOM are normal. Pupils are equal, round, and reactive to light.  Neck: Normal range of motion. Neck supple.  Cardiovascular: Normal rate, regular rhythm and normal heart sounds.   Pulmonary/Chest: Effort normal. No respiratory distress. She has decreased breath sounds.  Sitting in hunched position, diffuse shallow breath sounds  Abdominal: Soft. Bowel sounds are normal. She exhibits distension. There is no tenderness. There is no guarding, no CVA tenderness, no tenderness at McBurney's point and negative Murphy's sign.  Mild abdominal distention, diffuse abdominal discomfort without pinpoint TTP  Musculoskeletal: Normal range of motion. She exhibits no edema.  Neurological: She is alert and oriented to person, place, and time. She has normal strength. No cranial nerve deficit or sensory deficit.  CN grossly intact, moves all extremities appropriately, no acute neuro deficits appreciated  Skin: Skin is warm and dry. She is not diaphoretic.  Psychiatric: She has a normal mood and affect.    ED Course  Procedures (including critical care time)   Date: 04/22/2013   Rate: 87  Rhythm: normal sinus rhythm  QRS Axis: normal  Intervals: PR prolonged  ST/T Wave abnormalities: nonspecific T wave changes  Conduction Disutrbances:first-degree A-V block   Narrative Interpretation:   Old EKG Reviewed: unchanged   Labs Reviewed  CBC - Abnormal; Notable for the following:    RBC 3.55 (*)    Hemoglobin 10.5 (*)    HCT 32.0 (*)    RDW 16.2 (*)    All other components  within normal limits  COMPREHENSIVE METABOLIC PANEL - Abnormal; Notable for the following:    Sodium 134 (*)    Potassium 5.8 (*)    Chloride 93 (*)    BUN 85 (*)    Creatinine, Ser 18.17 (*)    GFR calc non Af Amer 2 (*)    GFR calc Af Amer 3 (*)    All other components within normal limits  POCT I-STAT TROPONIN I   Dg Chest 2 View  04/22/2013   *RADIOLOGY REPORT*  Clinical Data: Chest pain, history of CHF  CHEST - 2 VIEW  Comparison: Prior radiograph from 04/09/2013  Findings: Mild cardiac enlargement is grossly stable as compared to the prior exam.  The lungs are well inflated.  Tiny left pleural effusion is present, improved as compared to the prior exam.  No definite right pleural effusion is now seen.  Pulmonary vascular congestion is also improved.  There is no overt pulmonary edema.  No pneumothorax.  No acute osseous abnormality.  IVC filter is noted.  IMPRESSION: Interval improvement in pulmonary vascular congestion and bilateral pleural effusions, with small residual left pleural effusion.  No airspace consolidation.   Original Report Authenticated By: Rise Mu, M.D.   1. Hyperkalemia, diminished renal excretion   2. Pulmonary vascular congestion   3. ESRD (end stage renal disease) on dialysis   4. Noncompliance with renal dialysis     MDM   EKG NSR, no acute ischemic changes or peaked t-waves.  Trop negative.  CXR with pulmonary vascular congestion consistent with no dialysis txs.  Electrolyte abnormalities as above- K+ 5.8, Cl 93, Na+ 134, BUN 85, Cr 18.17.  Pt  currently on 2L via [place] with O2 sats maintained at 100%.    Consulted nephrology, Dr. Eliott Nine-- advised to give kayexalate for hyperkalemia and they will see her in the am.  Consulted hospitalist, Dr. Toniann Fail- pt will be admitted to [facility] team 10, telemetry.  Temp admit holding orders placed.  VS stable for transfer upstairs.            Garlon Hatchet, PA-C 04/22/13 2144

## 2013-04-22 NOTE — ED Notes (Signed)
Pt recently got put out of her HD center.  Pt has not had HD since last Thursday.  Pt is here with sob and chest pain and swelling to abdomen

## 2013-04-23 ENCOUNTER — Inpatient Hospital Stay (HOSPITAL_COMMUNITY): Payer: Managed Care, Other (non HMO)

## 2013-04-23 ENCOUNTER — Encounter (HOSPITAL_COMMUNITY): Payer: Self-pay | Admitting: *Deleted

## 2013-04-23 DIAGNOSIS — D649 Anemia, unspecified: Secondary | ICD-10-CM

## 2013-04-23 DIAGNOSIS — E875 Hyperkalemia: Secondary | ICD-10-CM

## 2013-04-23 LAB — CBC
MCH: 29.7 pg (ref 26.0–34.0)
MCV: 90.4 fL (ref 78.0–100.0)
Platelets: 205 10*3/uL (ref 150–400)
RBC: 3.13 MIL/uL — ABNORMAL LOW (ref 3.87–5.11)
RDW: 16.3 % — ABNORMAL HIGH (ref 11.5–15.5)

## 2013-04-23 LAB — BASIC METABOLIC PANEL
CO2: 17 mEq/L — ABNORMAL LOW (ref 19–32)
Calcium: 9.2 mg/dL (ref 8.4–10.5)
Creatinine, Ser: 18.98 mg/dL — ABNORMAL HIGH (ref 0.50–1.10)
Glucose, Bld: 92 mg/dL (ref 70–99)

## 2013-04-23 LAB — PHOSPHORUS: Phosphorus: 3.8 mg/dL (ref 2.3–4.6)

## 2013-04-23 LAB — MRSA PCR SCREENING: MRSA by PCR: POSITIVE — AB

## 2013-04-23 MED ORDER — SODIUM CHLORIDE 0.9 % IV SOLN: 100.0000 mL | INTRAVENOUS | Status: DC | PRN

## 2013-04-23 MED ORDER — IBUPROFEN 200 MG PO TABS
200.0000 mg | ORAL_TABLET | Freq: Once | ORAL | Status: AC
  Administered 2013-04-23: 200 mg via ORAL
  Filled 2013-04-23: qty 1

## 2013-04-23 MED ORDER — IOHEXOL 350 MG/ML SOLN
100.0000 mL | Freq: Once | INTRAVENOUS | Status: AC | PRN
  Administered 2013-04-23: 100 mL via INTRAVENOUS

## 2013-04-23 MED ORDER — PENTAFLUOROPROP-TETRAFLUOROETH EX AERO: 1.0000 "application " | INHALATION_SPRAY | CUTANEOUS | Status: DC | PRN

## 2013-04-23 MED ORDER — HEPARIN SODIUM (PORCINE) 1000 UNIT/ML DIALYSIS
1000.0000 [IU] | INTRAMUSCULAR | Status: DC | PRN
  Filled 2013-04-23: qty 1

## 2013-04-23 MED ORDER — DIPHENHYDRAMINE HCL 25 MG PO CAPS
ORAL_CAPSULE | ORAL | Status: AC
  Administered 2013-04-23: 25 mg via ORAL
  Filled 2013-04-23: qty 1

## 2013-04-23 MED ORDER — NEPRO/CARBSTEADY PO LIQD: 237.0000 mL | ORAL | Status: DC | PRN

## 2013-04-23 MED ORDER — FLUTICASONE PROPIONATE 50 MCG/ACT NA SUSP
2.0000 | Freq: Every day | NASAL | Status: DC
  Administered 2013-04-23 – 2013-04-30 (×8): 2 via NASAL
  Filled 2013-04-23 (×3): qty 16

## 2013-04-23 MED ORDER — LIDOCAINE-PRILOCAINE 2.5-2.5 % EX CREA: 1.0000 "application " | TOPICAL_CREAM | CUTANEOUS | Status: DC | PRN

## 2013-04-23 MED ORDER — LIDOCAINE HCL (PF) 1 % IJ SOLN: 5.0000 mL | INTRAMUSCULAR | Status: DC | PRN

## 2013-04-23 MED ORDER — OXYCODONE HCL 5 MG PO TABS
5.0000 mg | ORAL_TABLET | Freq: Four times a day (QID) | ORAL | Status: DC | PRN
  Administered 2013-04-23 – 2013-05-01 (×27): 5 mg via ORAL
  Filled 2013-04-23 (×28): qty 1

## 2013-04-23 MED ORDER — IBUPROFEN 600 MG PO TABS
600.0000 mg | ORAL_TABLET | Freq: Once | ORAL | Status: AC
  Administered 2013-04-23: 600 mg via ORAL
  Filled 2013-04-23: qty 1

## 2013-04-23 MED ORDER — ALTEPLASE 2 MG IJ SOLR
2.0000 mg | Freq: Once | INTRAMUSCULAR | Status: DC | PRN
  Filled 2013-04-23: qty 2

## 2013-04-23 NOTE — Consult Note (Signed)
[facility] Renal Consultation Note  Indication for Consultation:  Management of ESRD/hemodialysis; anemia, hypertension/volume and secondary hyperparathyroidism  HPI: [name] Small is a 22 y.o. female admitted with SOB progressive over past few days and Pleuritic Type Chest pain having missed HD since 04/11/13 her last tx and then she only had 2 hrs of her 3hr tx time.In the ER noted troponins were negative and  with K 5.8 .She has a History of severe noncompliance with her Outpatient HD and was discharged from [facility] with A 30 day notice on May 29,2014. She tells me "" [facility] is evaluating her for MWF schedule and she went to [facility] ER  this past Monday and discharged because did not need hemodialysis then.""   Now on HD without chest pain and no sob,"just itching".      Past Medical History  Diagnosis Date  . Hemodialysis patient   . Hypertension   . Pulmonary emboli 01/2012    Bilateral, moderate clot burden, areas of pulmonary infarction and central necrosis  . CHF (congestive heart failure)   . Cardiomyopathy   . Dysrhythmia     at times per pt.  . Anemia   . H/O transfusion of packed red blood cells   . 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 Polycystic kidney disease     [facility]  T,Th, S  . Cellulitis and abscess of face 03/22/2013  . Dialysis patient     Past Surgical History  Procedure Laterality Date  . Nephrectomy    . Av fistula placement    . Kidney transplant  2008    failed  . Tonsillectomy      as a child.  . Adenoidectomy    . Incision and drainage abscess Right 03/21/2013    Procedure: INCISION AND DRAINAGE RIGHT CHEEK ABSCESS REMOVAL OF FOREIGN BODY;  Surgeon: Serena Colonel, MD;  Location: [facility] OR;  Service: ENT;  Laterality: Right;      Family History  Problem  Relation Age of Onset  . Polycystic kidney disease Father   Social= Single, no children, and lives with mother and    reports that she quit smoking about a year ago. Her smoking use included Cigarettes. She smoked 0.20 packs per day. She has never used smokeless tobacco. She reports that she does not drink alcohol or use illicit drugs.   Allergies  Allergen Reactions  . Tramadol Anaphylaxis  . Morphine And Related Itching    Ok with oxycodone  . Vicodin (Hydrocodone-Acetaminophen) Other (See Comments)    unknown    Prior to Admission medications   Medication Sig Start Date End Date Taking? Authorizing Provider  amLODipine (NORVASC) 5 MG tablet Take 5 mg by mouth daily.   Yes Historical Provider, MD  calcium acetate (PHOSLO) 667 MG capsule Take 1,334 mg by mouth 3 (three) times daily with meals. Take 667mg  with snacks   Yes Historical Provider, MD  carvedilol (COREG) 3.125 MG tablet Take 3.125 mg by mouth 2 (two) times daily with a meal.   Yes Historical Provider, MD  famotidine (PEPCID) 20 MG tablet Take 20 mg by mouth daily as needed. For heartburn 03/24/13  Yes Elease Etienne, MD  multivitamin (RENA-VIT) TABS tablet Take 1 tablet by mouth every morning.   Yes Historical Provider, MD  ondansetron (ZOFRAN ODT) 4 MG disintegrating tablet  Take 1 tablet (4 mg total) by mouth every 8 (eight) hours as needed for nausea. 02/18/13  Yes Ardyth Gal, MD  ramipril (ALTACE) 5 MG tablet Take 1 tablet (5 mg total) by mouth 2 (two) times daily. 03/24/13  Yes Elease Etienne, MD  sevelamer carbonate (RENVELA) 800 MG tablet Take 800 mg by mouth 3 (three) times daily with meals.   Yes Historical Provider, MD    ZOX:WRUEAV chloride, sodium chloride, acetaminophen, acetaminophen, alteplase, calcium acetate, diphenhydrAMINE, feeding supplement (NEPRO CARB STEADY), heparin, lidocaine (PF), lidocaine-prilocaine, ondansetron (ZOFRAN) IV, ondansetron, ondansetron, pentafluoroprop-tetrafluoroeth  Results for  orders placed during the hospital encounter of 04/22/13 (from the past 48 hour(s))  CBC     Status: Abnormal   Collection Time    04/22/13  6:25 PM      Result Value Range   WBC 5.1  4.0 - 10.5 K/uL   RBC 3.55 (*) 3.87 - 5.11 MIL/uL   Hemoglobin 10.5 (*) 12.0 - 15.0 g/dL   HCT 40.9 (*) 81.1 - 91.4 %   MCV 90.1  78.0 - 100.0 fL   MCH 29.6  26.0 - 34.0 pg   MCHC 32.8  30.0 - 36.0 g/dL   RDW 78.2 (*) 95.6 - 21.3 %   Platelets 233  150 - 400 K/uL  COMPREHENSIVE METABOLIC PANEL     Status: Abnormal   Collection Time    04/22/13  6:25 PM      Result Value Range   Sodium 134 (*) 135 - 145 mEq/L   Potassium 5.8 (*) 3.5 - 5.1 mEq/L   Chloride 93 (*) 96 - 112 mEq/L   CO2 21  19 - 32 mEq/L   Glucose, Bld 94  70 - 99 mg/dL   BUN 85 (*) 6 - 23 mg/dL   Creatinine, Ser 08.65 (*) 0.50 - 1.10 mg/dL   Calcium 9.7  8.4 - 78.4 mg/dL   Total Protein 7.6  6.0 - 8.3 g/dL   Albumin 3.7  3.5 - 5.2 g/dL   AST 22  0 - 37 U/L   ALT 14  0 - 35 U/L   Alkaline Phosphatase 75  39 - 117 U/L   Total Bilirubin 0.3  0.3 - 1.2 mg/dL   GFR calc non Af Amer 2 (*) >90 mL/min   GFR calc Af Amer 3 (*) >90 mL/min   Comment:            The eGFR has been calculated     using the CKD EPI equation.     This calculation has not been     validated in all clinical     situations.     eGFR's persistently     <90 mL/min signify     possible Chronic Kidney Disease.  POCT I-STAT TROPONIN I     Status: None   Collection Time    04/22/13  6:38 PM      Result Value Range   Troponin i, poc 0.03  0.00 - 0.08 ng/mL   Comment 3            Comment: Due to the release kinetics of cTnI,     a negative result within the first hours     of the onset of symptoms does not rule out     myocardial infarction with certainty.     If myocardial infarction is still suspected,     repeat the test at appropriate intervals.  D-DIMER, QUANTITATIVE     Status:  Abnormal   Collection Time    04/22/13  9:07 PM      Result Value Range    D-Dimer, Quant 1.29 (*) 0.00 - 0.48 ug/mL-FEU   Comment:            AT THE INHOUSE ESTABLISHED CUTOFF     VALUE OF 0.48 ug/mL FEU,     THIS ASSAY HAS BEEN DOCUMENTED     IN THE LITERATURE TO HAVE     A SENSITIVITY AND NEGATIVE     PREDICTIVE VALUE OF AT LEAST     98 TO 99%.  THE TEST RESULT     SHOULD BE CORRELATED WITH     AN ASSESSMENT OF THE CLINICAL     PROBABILITY OF DVT / VTE.  HCG, SERUM, QUALITATIVE     Status: None   Collection Time    04/23/13 12:00 AM      Result Value Range   Preg, Serum NEGATIVE  NEGATIVE  BASIC METABOLIC PANEL     Status: Abnormal   Collection Time    04/23/13  6:38 AM      Result Value Range   Sodium 133 (*) 135 - 145 mEq/L   Potassium 5.0  3.5 - 5.1 mEq/L   Chloride 92 (*) 96 - 112 mEq/L   CO2 17 (*) 19 - 32 mEq/L   Glucose, Bld 92  70 - 99 mg/dL   BUN 84 (*) 6 - 23 mg/dL   Creatinine, Ser 40.98 (*) 0.50 - 1.10 mg/dL   Calcium 9.2  8.4 - 11.9 mg/dL   GFR calc non Af Amer 2 (*) >90 mL/min   GFR calc Af Amer 3 (*) >90 mL/min   Comment:            The eGFR has been calculated     using the CKD EPI equation.     This calculation has not been     validated in all clinical     situations.     eGFR's persistently     <90 mL/min signify     possible Chronic Kidney Disease.  CBC     Status: Abnormal   Collection Time    04/23/13  6:38 AM      Result Value Range   WBC 4.2  4.0 - 10.5 K/uL   RBC 3.13 (*) 3.87 - 5.11 MIL/uL   Hemoglobin 9.3 (*) 12.0 - 15.0 g/dL   HCT 14.7 (*) 82.9 - 56.2 %   MCV 90.4  78.0 - 100.0 fL   MCH 29.7  26.0 - 34.0 pg   MCHC 32.9  30.0 - 36.0 g/dL   RDW 13.0 (*) 86.5 - 78.4 %   Platelets 205  150 - 400 K/uL    ROS: No fevers, chills, sweats, or GI Symptoms. Only as in HPI.   Physical Exam: Filed Vitals:   04/23/13 1026  BP: 141/92  Pulse: 80  Temp:   Resp: 15     General: Alert on HD , Young Black Female Soft spoken, NAD HEENT: [place], MMM Eyes: eomi Neck: supple Heart: RRR, soft 1/6 sem lsb, no  rub Lungs: CTA bilaterally Abdomen: Bs +, soft, nontender Extremities:  No pedal edema Skin:  No overt rash or ulcers seen Neuro: Alert OX3, no acute focal deficits noted Dialysis Access:  Patent on hd L U A AVF  Dialysis Orders ([facility] TTS up until June 30, now discharged from [facility] care as of Jun 30 '14). 3hrs70min  69.5kg    2.25ca      Heparin none    LUA AVF Zemplar     Epogen 3600    Assess/Plan 1. SOB/Hyperkalemia secondary to Probably Missed HD with Noncompliance= HD today and in AM  2. ESRD - Uremic//Acidodic  With Cr. ~19/Co2 17 / HD  In am also .  Prior schedule was TTS , However Without a Outpt. Unit To Go To Yet. (.Difficult situation with her compliance issue) 3. Hypertension/volume  - Bp on HD 151/91 and  Below prior edw / attempting  uf on hd/ pleural effusion /vascular congestion on cxr in ER/ on Norvasc 5mg  hs and Coreg 3.125mg  bid as op meds ?? compliance 4. Anemia  - hgb 9.3 Give aranesp  q weekly  thurs 5. Metabolic bone disease -  phoslo as binder and Hectorol  on hd 6. Noncompliance with HD  Lenny Pastel, PA-C [facility] Beeper [phone] 04/23/2013, 11:13 AM   Patient seen and examined.  Agree with assessment and plan as above.  22 yo ESRD patient with hx of HTN, anemia and poor compliance with outpt dialysis.  Pt was just recently discharged from our practice ([facility]) due to severe noncompliance with outpt dialysis.  She says she has been pursuing renal care in [facility], but that it is "slow-going".  She presented feeling "bad" and has marked azotemia.  Is getting HD today as inpatient , and have recommended additional HD in am, then should be OK for discharge.   Vinson Moselle  MD Pager [phone]    Cell  [phone] 04/23/2013, 4:36 PM

## 2013-04-23 NOTE — Progress Notes (Signed)
Patient ID: [name] Small  female  ZOX:[id number]    DOB: 1991/02/09    DOA: 04/22/2013  PCP: Ellin Saba  Assessment/Plan: Principal Problem:   Fluid overload with history of end-stage renal disease on hemodialysis, noncompliant - Renal service consulted, discussed with Dr. Arlean Hopping, hemodialysis today - Patient also has been discharged from [facility] from kidney Center due to noncompliance, she reported to me that she had missed HD on Monday, however to RN she reported that she missed it for the whole week. - Will defer to renal service for further recommendations, Does she need to have CLIP process again?  Active Problems:   ESRD (end stage renal disease): See #1    HTN (hypertension) - Restarted all antihypertensives, hemodialysis today  Allergies/sinusitis - Started on Flonase spray  DVT Prophylaxis:  Code Status:  Disposition: Unclear, pending renal recommendations    Subjective: Denies any specific complaints except nasal stuffiness  Objective: Weight change:   Intake/Output Summary (Last 24 hours) at 04/23/13 1205 Last data filed at 04/23/13 0900  Gross per 24 hour  Intake    360 ml  Output      0 ml  Net    360 ml   Blood pressure 151/91, pulse 84, temperature 98.7 F (37.1 C), temperature source Oral, resp. rate 16, height 5\' 3"  (1.6 m), weight 65.4 kg (144 lb 2.9 oz), SpO2 99.00%.  Physical Exam: General: Alert and awake, oriented x3, not in any acute distress. CVS: S1-S2 clear, no murmur rubs or gallops Chest: clear to auscultation bilaterally, no wheezing, rales or rhonchi Abdomen: soft nontender, nondistended, normal bowel sounds  Extremities: no cyanosis, clubbing or edema noted bilaterally Neuro: Cranial nerves II-XII intact, no focal neurological deficits  Lab Results: Basic Metabolic Panel:  Recent Labs Lab 04/22/13 1825 04/23/13 0638  NA 134* 133*  K 5.8* 5.0  CL 93* 92*  CO2 21 17*  GLUCOSE 94 92  BUN 85* 84*  CREATININE 18.17* 18.98*   CALCIUM 9.7 9.2   Liver Function Tests:  Recent Labs Lab 04/22/13 1825  AST 22  ALT 14  ALKPHOS 75  BILITOT 0.3  PROT 7.6  ALBUMIN 3.7   No results found for this basename: LIPASE, AMYLASE,  in the last 168 hours No results found for this basename: AMMONIA,  in the last 168 hours CBC:  Recent Labs Lab 04/22/13 1825 04/23/13 0638  WBC 5.1 4.2  HGB 10.5* 9.3*  HCT 32.0* 28.3*  MCV 90.1 90.4  PLT 233 205   Cardiac Enzymes: No results found for this basename: CKTOTAL, CKMB, CKMBINDEX, TROPONINI,  in the last 168 hours BNP: No components found with this basename: POCBNP,  CBG: No results found for this basename: GLUCAP,  in the last 168 hours   Micro Results: No results found for this or any previous visit (from the past 240 hour(s)).  Studies/Results: Dg Chest 2 View  04/22/2013   *RADIOLOGY REPORT*  Clinical Data: Chest pain, history of CHF  CHEST - 2 VIEW  Comparison: Prior radiograph from 04/09/2013  Findings: Mild cardiac enlargement is grossly stable as compared to the prior exam.  The lungs are well inflated.  Tiny left pleural effusion is present, improved as compared to the prior exam.  No definite right pleural effusion is now seen.  Pulmonary vascular congestion is also improved.  There is no overt pulmonary edema.  No pneumothorax.  No acute osseous abnormality.  IVC filter is noted.  IMPRESSION: Interval improvement in pulmonary vascular congestion and  bilateral pleural effusions, with small residual left pleural effusion.  No airspace consolidation.   Original Report Authenticated By: Rise Mu, M.D.   Dg Chest 2 View  04/09/2013   *RADIOLOGY REPORT*  Clinical Data: Headaches.  Shortness of breath.  CHEST - 2 VIEW  Comparison: PA and lateral chest 02/18/2013.  Findings: There is enlargement of the cardiopericardial silhouette. There are small bilateral pleural effusions.  Pulmonary vascular congestion without frank edema is noted.  No pneumothorax.   IMPRESSION:  1.  Enlarged cardiopericardial silhouette compatible with cardiomegaly and/or pericardial effusion. 2.  Small bilateral pleural effusions.   Original Report Authenticated By: Holley Dexter, M.D.   Ct Angio Chest Pe W/cm &/or Wo Cm  04/23/2013   *RADIOLOGY REPORT*  Clinical Data: Chest pain.  Short of breath.  Pulmonary embolism. End-stage renal disease.  CT ANGIOGRAPHY CHEST  Technique:  Multidetector CT imaging of the chest using the standard protocol during bolus administration of intravenous contrast. Multiplanar reconstructed images including MIPs were obtained and reviewed to evaluate the vascular anatomy.  Contrast: OMNIPAQUE IOHEXOL 350 MG/ML SOLN  Comparison: 11/12/2012.  Findings: Technically adequate study.  Negative for pulmonary embolus.  No acute aortic abnormality.  No axillary adenopathy. Cardiomegaly is present.  Incidental visualization of the upper abdomen is within normal limits.  No pericardial effusion.  No pleural effusion.  Lungs demonstrate dependent atelectasis.  There is no airspace disease. Central airways are patent.  IMPRESSION:  1.  Technically adequate study without pulmonary embolus or acute abnormality. 2.  Cardiomegaly. 3.  Trace left pleural effusion with dependent atelectasis.   Original Report Authenticated By: Andreas Newport, M.D.    Medications: Scheduled Meds: . amLODipine  5 mg Oral Daily  . calcium acetate  1,334 mg Oral TID WC  . carvedilol  3.125 mg Oral BID WC  . diphenhydrAMINE      . enoxaparin (LOVENOX) injection  30 mg Subcutaneous Q24H  . famotidine  20 mg Oral Daily  . fluticasone  2 spray Each Nare Daily  . ibuprofen  200 mg Oral Once  . multivitamin  1 tablet Oral q morning - 10a  . ramipril  5 mg Oral BID  . sevelamer carbonate  800 mg Oral TID WC  . sodium chloride  3 mL Intravenous Q12H  . sodium chloride  3 mL Intravenous Q12H      LOS: 1 day   RAI,RIPUDEEP M.D. [facility] 04/23/2013, 12:05  PM Pager: [phone]  If 7PM-7AM, please contact night-coverage [email or web address] Password [email or web address]

## 2013-04-23 NOTE — Procedures (Signed)
I was present at this dialysis session. I have reviewed the session itself and made appropriate changes.   Vinson Moselle  MD Pager [phone]    Cell  [phone] 04/23/2013, 1:12 PM

## 2013-04-23 NOTE — H&P (Signed)
[facility] History and Physical  Porsche Noguchi ZOX:[id number] DOB: 02-Feb-1991 DOA: 04/22/2013  Referring physician: ER physician. PCP: Ellin Saba  Specialists: Nephrologist for dialysis.  Chief Complaint: Shortness of breath.  HPI: [name] Small is a 22 y.o. female history of ESRD on hemodialysis who has missed dialysis last 3 times presents with complaints of shortness of breath. Patient's symptoms started yesterday morning which gradually progressed. Patient in addition has been having some pleuritic-type of chest pain. Denies any fever chills productive cough. Patient also has abdominal distention but denies any pain or diarrhea nausea vomiting. In the ER patient's potassium was mildly elevated with no EKG changes and troponins were negative. On-call nephrologist was consulted by ER physician and patient was actually discharged from the practice because of noncompliance and patient is supposed to follow with [facility] for which patient states the dialysis unit has still not started her dialysis. At this time nephrologist as advised to admit and direct in a.m. as patient is not acutely in distress. Due to mild hyperkalemia Kayexalate was given.  Review of Systems: As presented in the history of presenting illness, rest negative.  Past Medical History  Diagnosis Date  . Hemodialysis patient   . Hypertension   . Pulmonary emboli 01/2012    Bilateral, moderate clot burden, areas of pulmonary infarction and central necrosis  . CHF (congestive heart failure)   . Cardiomyopathy   . Dysrhythmia     at times per pt.  . Anemia   . H/O transfusion of packed red blood cells   . 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 Polycystic kidney disease     [facility]  T,Th, S  . Cellulitis and abscess of face 03/22/2013  . Dialysis patient    Past Surgical  History  Procedure Laterality Date  . Nephrectomy    . Av fistula placement    . Kidney transplant  2008    failed  . Tonsillectomy      as a child.  . Adenoidectomy    . Incision and drainage abscess Right 03/21/2013    Procedure: INCISION AND DRAINAGE RIGHT CHEEK ABSCESS REMOVAL OF FOREIGN BODY;  Surgeon: Serena Colonel, MD;  Location: [facility] OR;  Service: ENT;  Laterality: Right;   Social History:  reports that she quit smoking about a year ago. Her smoking use included Cigarettes. She smoked 0.20 packs per day. She has never used smokeless tobacco. She reports that she does not drink alcohol or use illicit drugs. Home. where does patient live-- Can do ADLs. Can patient participate in ADLs?  Allergies  Allergen Reactions  . Tramadol Anaphylaxis  . Morphine And Related Itching    Ok with oxycodone  . Vicodin (Hydrocodone-Acetaminophen) Other (See Comments)    unknown    Family History  Problem Relation Age of Onset  . Polycystic kidney disease Father       Prior to Admission medications   Medication Sig Start Date End Date Taking? Authorizing Provider  amLODipine (NORVASC) 5 MG tablet Take 5 mg by mouth daily.   Yes Historical Provider, MD  calcium acetate (PHOSLO) 667 MG capsule Take 1,334 mg by mouth 3 (three) times daily with meals. Take 667mg  with snacks   Yes Historical Provider, MD  carvedilol (COREG) 3.125 MG tablet Take 3.125 mg by mouth 2 (two) times daily with a meal.   Yes Historical Provider,  MD  famotidine (PEPCID) 20 MG tablet Take 20 mg by mouth daily as needed. For heartburn 03/24/13  Yes Elease Etienne, MD  multivitamin (RENA-VIT) TABS tablet Take 1 tablet by mouth every morning.   Yes Historical Provider, MD  ondansetron (ZOFRAN ODT) 4 MG disintegrating tablet Take 1 tablet (4 mg total) by mouth every 8 (eight) hours as needed for nausea. 02/18/13  Yes Ardyth Gal, MD  ramipril (ALTACE) 5 MG tablet Take 1 tablet (5 mg total) by mouth 2 (two) times daily. 03/24/13   Yes Elease Etienne, MD  sevelamer carbonate (RENVELA) 800 MG tablet Take 800 mg by mouth 3 (three) times daily with meals.   Yes Historical Provider, MD   Physical Exam: Filed Vitals:   04/22/13 2002 04/22/13 2010 04/22/13 2100 04/22/13 2136  BP: 151/98 151/98 145/96 158/96  Pulse: 89 87 89 92  Temp:  98.5 F (36.9 C)  99.7 F (37.6 C)  TempSrc:  Oral  Oral  Resp:  13 14 18   Height:    5\' 3"  (1.6 m)  Weight:    59.6 kg (131 lb 6.3 oz)  SpO2: 100% 100% 100% 100%     General:  Well-developed and nourished.  Eyes: Anicteric no pallor.  ENT: No discharge from ears eyes nose mouth.  Neck: No mass felt.  Cardiovascular: S1-S2 heard.  Respiratory: No rhonchi or crepitations.  Abdomen: Soft nontender bowel sounds present.  Skin: No rash.  Musculoskeletal: No edema.  Psychiatric: Appears normal.  Neurologic: Alert awake oriented to time place and person. Moves all extremities.  Labs on Admission:  Basic Metabolic Panel:  Recent Labs Lab 04/22/13 1825  NA 134*  K 5.8*  CL 93*  CO2 21  GLUCOSE 94  BUN 85*  CREATININE 18.17*  CALCIUM 9.7   Liver Function Tests:  Recent Labs Lab 04/22/13 1825  AST 22  ALT 14  ALKPHOS 75  BILITOT 0.3  PROT 7.6  ALBUMIN 3.7   No results found for this basename: LIPASE, AMYLASE,  in the last 168 hours No results found for this basename: AMMONIA,  in the last 168 hours CBC:  Recent Labs Lab 04/22/13 1825  WBC 5.1  HGB 10.5*  HCT 32.0*  MCV 90.1  PLT 233   Cardiac Enzymes: No results found for this basename: CKTOTAL, CKMB, CKMBINDEX, TROPONINI,  in the last 168 hours  BNP (last 3 results)  Recent Labs  04/09/13 1005  PROBNP >70000.0*   CBG: No results found for this basename: GLUCAP,  in the last 168 hours  Radiological Exams on Admission: Dg Chest 2 View  04/22/2013   *RADIOLOGY REPORT*  Clinical Data: Chest pain, history of CHF  CHEST - 2 VIEW  Comparison: Prior radiograph from 04/09/2013  Findings:  Mild cardiac enlargement is grossly stable as compared to the prior exam.  The lungs are well inflated.  Tiny left pleural effusion is present, improved as compared to the prior exam.  No definite right pleural effusion is now seen.  Pulmonary vascular congestion is also improved.  There is no overt pulmonary edema.  No pneumothorax.  No acute osseous abnormality.  IVC filter is noted.  IMPRESSION: Interval improvement in pulmonary vascular congestion and bilateral pleural effusions, with small residual left pleural effusion.  No airspace consolidation.   Original Report Authenticated By: Rise Mu, M.D.    EKG: Independently reviewed. Normal sinus rhythm.  Assessment/Plan Principal Problem:   Fluid overload Active Problems:   ESRD (end stage renal disease)  HTN (hypertension)   1. Shortness of breath most likely from fluid overload secondary to missed dialysis - patient is not in acute distress. Patient to receive dialysis in a.m. for nephrology. 2. History of pulmonary embolism  - patient has pleuritic-type of chest pain. Presently patient is off Coumadin. Patient was diagnosed with PE last year and was on Coumadin for 6 months per patient. At this time we will check d-dimer if positive we'll try to get a CT angiogram of the chest to rule out PE.  3. Hypertension  - continue present medications.     Code Status:  full code.  Family Communication:  none.   Disposition Plan:  admit to inpatient.     Lacresha Fusilier N. [facility] Pager [phone].   If 7PM-7AM, please contact night-coverage [email or web address] Password [facility] 04/23/2013, 4:21 AM

## 2013-04-23 NOTE — Progress Notes (Addendum)
Called re need for HD for patient with ESRD, h/o failed renal transplant, recently on TTS HD and discharged from the [facility] due to non-compliance and failure to show up for dialysis treatments (received her 30 day notice on May 29 and has yet to make further outpt arrangements) who came to ED yesterday with dyspnea and mild hyperkalemia.  She last dialyzed on 6/26 for 2'20" (signed off early) and did not come for her HD on 6/28 because "it was her birthday and she did not want to mess up the celebratory weekend".  Have written some dialysis orders for this AM and will see her formally in the dialysis unit. (previous outpt orders were for TTS 3.75 hr Optiflux 180 2 K 2.25 left upper AVF 400/A 1.5 no heparin EDW 69.5 Epo 3600 hectorol 8)

## 2013-04-24 DIAGNOSIS — I10 Essential (primary) hypertension: Secondary | ICD-10-CM

## 2013-04-24 DIAGNOSIS — N186 End stage renal disease: Secondary | ICD-10-CM

## 2013-04-24 DIAGNOSIS — E8779 Other fluid overload: Secondary | ICD-10-CM

## 2013-04-24 LAB — RENAL FUNCTION PANEL
Albumin: 3.6 g/dL (ref 3.5–5.2)
Calcium: 9.5 mg/dL (ref 8.4–10.5)
Creatinine, Ser: 9.34 mg/dL — ABNORMAL HIGH (ref 0.50–1.10)
GFR calc non Af Amer: 5 mL/min — ABNORMAL LOW (ref >90)
Phosphorus: 7 mg/dL — ABNORMAL HIGH (ref 2.3–4.6)

## 2013-04-24 MED ORDER — TUBERCULIN PPD 5 UNIT/0.1ML ID SOLN
5.0000 [IU] | Freq: Once | INTRADERMAL | Status: DC
  Filled 2013-04-24: qty 0.1

## 2013-04-24 MED ORDER — CHLORHEXIDINE GLUCONATE CLOTH 2 % EX PADS
6.0000 | MEDICATED_PAD | Freq: Every day | CUTANEOUS | Status: AC
  Administered 2013-04-24 – 2013-04-28 (×5): 6 via TOPICAL

## 2013-04-24 MED ORDER — MUPIROCIN 2 % EX OINT
1.0000 "application " | TOPICAL_OINTMENT | Freq: Two times a day (BID) | CUTANEOUS | Status: AC
  Administered 2013-04-24 – 2013-04-28 (×10): 1 via NASAL
  Filled 2013-04-24: qty 22

## 2013-04-24 NOTE — Progress Notes (Signed)
In to speak with patient regarding situation with OP hemodialysis center placement. Patient stated she was discharged from the [facility] center d/t noncompliance. Patient stated that she should be set up at the HD center on [facility] in [facility] on Monday. Informed patient that we had called the center and were made aware that she had not been accepted by an MD. Encouraged patient to contact the center to follow up. Will be back to speak with patient later today. Dr. Isidoro Donning made aware. Lopaka Karge, Charlyne Quale

## 2013-04-24 NOTE — Clinical Documentation Improvement (Signed)
THIS DOCUMENT IS NOT A PERMANENT PART OF THE MEDICAL RECORD  Please update your documentation with the medical record to reflect your response to this query. If you need help knowing how to do this please call [phone].  04/24/13  Dear Dr. [facility],  In a better effort to capture your patient's severity of illness, reflect appropriate length of stay and utilization of resources, a review of the patient medical record has revealed the following indicators the diagnosis of Heart Failure.    Based on your clinical judgment, please clarify type & acuity of CHF and document in a progress note and/or discharge summary the clinical condition associated with the following supporting information:  In responding to this query please exercise your independent judgment.  The fact that a query is asked, does not imply that any particular answer is desired or expected.  Possible Clinical Conditions?   Chronic Systolic Congestive Heart Failure Chronic Diastolic Congestive Heart Failure Chronic Systolic & Diastolic Congestive Heart Failure Acute Systolic Congestive Heart Failure Acute Diastolic Congestive Heart Failure Acute Systolic & Diastolic Congestive Heart Failure Acute on Chronic Systolic Congestive Heart Failure Acute on Chronic Diastolic Congestive Heart Failure Acute on Chronic Systolic & Diastolic  Congestive Heart Failure Other Condition________________________________________ Cannot Clinically Determine  Supporting Information: Pt has history of CHF as per notes    Reviewed: reviewed and addended in the progress notes  Thank You,  Nevin Bloodgood RN, BSN, CCDS Clinical Documentation Specialist: [phone] [phone] Health Information Management [place]

## 2013-04-24 NOTE — Progress Notes (Signed)
Subjective:  No cos/eeling better since HD/ For HD today Objective Vital signs in last 24 hours: Filed Vitals:   04/23/13 1500 04/23/13 2048 04/24/13 0506 04/24/13 0815  BP: 141/81 123/89 134/89 124/78  Pulse: 94 80 72   Temp:  98.8 F (37.1 C) 97.9 F (36.6 C) 98.2 F (36.8 C)  TempSrc:  Oral Oral Oral  Resp:  16 16 20   Height:      Weight:  65.4 kg (144 lb 2.9 oz)    SpO2: 98% 100% 100% 98%   Weight change: 5.8 kg (12 lb 12.6 oz)  Intake/Output Summary (Last 24 hours) at 04/24/13 1019 Last data filed at 04/24/13 0900  Gross per 24 hour  Intake    220 ml  Output   2000 ml  Net  -1780 ml   Labs: Basic Metabolic Panel:  Recent Labs Lab 04/22/13 1825 04/23/13 0638 04/23/13 1541  NA 134* 133*  --   K 5.8* 5.0  --   CL 93* 92*  --   CO2 21 17*  --   GLUCOSE 94 92  --   BUN 85* 84*  --   CREATININE 18.17* 18.98*  --   CALCIUM 9.7 9.2  --   PHOS  --   --  3.8   Liver Function Tests:  Recent Labs Lab 04/22/13 1825  AST 22  ALT 14  ALKPHOS 75  BILITOT 0.3  PROT 7.6  ALBUMIN 3.7   No results found for this basename: LIPASE, AMYLASE,  in the last 168 hours No results found for this basename: AMMONIA,  in the last 168 hours CBC:  Recent Labs Lab 04/22/13 1825 04/23/13 0638  WBC 5.1 4.2  HGB 10.5* 9.3*  HCT 32.0* 28.3*  MCV 90.1 90.4  PLT 233 205   Cardiac Enzymes: No results found for this basename: CKTOTAL, CKMB, CKMBINDEX, TROPONINI,  in the last 168 hours CBG: No results found for this basename: GLUCAP,  in the last 168 hours  Iron Studies: No results found for this basename: IRON, TIBC, TRANSFERRIN, FERRITIN,  in the last 72 hours Studies/Results: Dg Chest 2 View  04/22/2013   *RADIOLOGY REPORT*  Clinical Data: Chest pain, history of CHF  CHEST - 2 VIEW  Comparison: Prior radiograph from 04/09/2013  Findings: Mild cardiac enlargement is grossly stable as compared to the prior exam.  The lungs are well inflated.  Tiny left pleural effusion is  present, improved as compared to the prior exam.  No definite right pleural effusion is now seen.  Pulmonary vascular congestion is also improved.  There is no overt pulmonary edema.  No pneumothorax.  No acute osseous abnormality.  IVC filter is noted.  IMPRESSION: Interval improvement in pulmonary vascular congestion and bilateral pleural effusions, with small residual left pleural effusion.  No airspace consolidation.   Original Report Authenticated By: Rise Mu, M.D.   Ct Angio Chest Pe W/cm &/or Wo Cm  04/23/2013   *RADIOLOGY REPORT*  Clinical Data: Chest pain.  Short of breath.  Pulmonary embolism. End-stage renal disease.  CT ANGIOGRAPHY CHEST  Technique:  Multidetector CT imaging of the chest using the standard protocol during bolus administration of intravenous contrast. Multiplanar reconstructed images including MIPs were obtained and reviewed to evaluate the vascular anatomy.  Contrast: OMNIPAQUE IOHEXOL 350 MG/ML SOLN  Comparison: 11/12/2012.  Findings: Technically adequate study.  Negative for pulmonary embolus.  No acute aortic abnormality.  No axillary adenopathy. Cardiomegaly is present.  Incidental visualization of the upper abdomen  is within normal limits.  No pericardial effusion.  No pleural effusion.  Lungs demonstrate dependent atelectasis.  There is no airspace disease. Central airways are patent.  IMPRESSION:  1.  Technically adequate study without pulmonary embolus or acute abnormality. 2.  Cardiomegaly. 3.  Trace left pleural effusion with dependent atelectasis.   Original Report Authenticated By: Andreas Newport, M.D.   Medications:   . amLODipine  5 mg Oral Daily  . calcium acetate  1,334 mg Oral TID WC  . carvedilol  3.125 mg Oral BID WC  . Chlorhexidine Gluconate Cloth  6 each Topical Q0600  . enoxaparin (LOVENOX) injection  30 mg Subcutaneous Q24H  . famotidine  20 mg Oral Daily  . fluticasone  2 spray Each Nare Daily  . multivitamin  1 tablet Oral q  morning - 10a  . mupirocin ointment  1 application Nasal BID  . ramipril  5 mg Oral BID  . sevelamer carbonate  800 mg Oral TID WC  . sodium chloride  3 mL Intravenous Q12H  . sodium chloride  3 mL Intravenous Q12H   I  have reviewed scheduled and prn medications.  Physical Exam: General: Alert NAd young BF Heart: RRR, no rub or mur. Lungs: CTA bilat. Abdomen: BS Pos. Soft nontender Extremities: Dialysis Access: No pedal edema/ Pos. Bruit L U a avf   Dialysis Orders ([facility] TTS up until June 30, now discharged from [facility] care as of Jun 30 '14).  3hrs40min 69.5kg 2.25ca Heparin none LUA AVF  Zemplar Epogen 3600   Assess/Plan  1 SOB- due to missed HD, vol excess; improved w HD. HD planned again today 2 Hyperkalemia-  better 3 CKD, discharged from [facility] on 6/30 after 30 day notice. Says she is looking for neph group 4 Hypertension- cont BB and CCB 5 Anemia - hgb 9.3 Give aranesp q weekly thurs 6 Metabolic bone disease -On Vit d and binders  Lenny Pastel, PA-C [facility] Beeper [phone] 04/24/2013,10:19 AM  LOS: 2 days   Patient seen and examined.  Agree with assessment and plan as above. Vinson Moselle  MD Pager [phone]    Cell  [phone] 04/24/2013, 11:36 AM

## 2013-04-24 NOTE — Progress Notes (Signed)
First visit with pt as per charge nurse's referral. Pt was alert and oriented, and her female friend was with her when I visited. I introduced myself and my role to the pt and her friend. Pt stated that she was doing fine and that her stress level was moderate at a 5 on a scale from 0 to 10. Pt stated that she is still trying to find a nephrologist. Pt briefly shared her history of renal disease since the age of 29. Pt discussed some of the physiological effects of dialysis on her. Pt shared some of her interests including traveling. Pt and I plan on a follow-up visit if pt is still in the hospital on Monday.   Sherol Dade Counselor Intern Haroldine Laws

## 2013-04-24 NOTE — Progress Notes (Addendum)
It is noted that this patient was d/ced from [facility] for non compliance issues.  In speaking with the patient she stated that she was accepted [facility] [address] - I called that center - affiliated with [facility], and it was said that the patient was NOT accepted d/t they could not get a physician to accept her.  I spoke with Su Hoff with [facility] at [phone] confirmed that no md will accept with her.  I spoke with [facility] ([phone]) and the staff told me that they were not made aware of this patient needing a center or md so I started that process.  Judy from hd was sending the information needed for this patient to be reviewed at [facility].  Continue to monitor.

## 2013-04-24 NOTE — Progress Notes (Addendum)
Patient ID: [name] Small  female  ZOX:[id number]    DOB: July 09, 1991    DOA: 04/22/2013  PCP: Ellin Saba  Assessment/Plan: Principal Problem:   Fluid overload with history of end-stage renal disease on hemodialysis, noncompliant - Renal service following, hemodialysis yesterday and again today - Patient also has been discharged from [facility] due to noncompliance, she stated that she was waiting for approval from [facility].  However found to be not accepted there as well.  - Will defer to renal service for further recommendations, CLIP process started. - will place PPD  Active Problems:   ESRD (end stage renal disease): See #1    HTN (hypertension) - Restarted all antihypertensives, hemodialysis again today  Allergies/sinusitis - Started on Flonase spray, feels better  Chronic combined systolic and diastolic CHF: - Cont HD for volume overload,  - cont coreg, ACEI  DVT Prophylaxis:  Code Status:  Disposition: Unclear, pending Out-patient dialysis approval    Subjective: Denies any specific complaints, feels better today  Objective: Weight change: 5.8 kg (12 lb 12.6 oz)  Intake/Output Summary (Last 24 hours) at 04/24/13 1054 Last data filed at 04/24/13 0900  Gross per 24 hour  Intake    220 ml  Output   2000 ml  Net  -1780 ml   Blood pressure 124/78, pulse 72, temperature 98.2 F (36.8 C), temperature source Oral, resp. rate 20, height 5\' 3"  (1.6 m), weight 65.4 kg (144 lb 2.9 oz), SpO2 98.00%.  Physical Exam: General: Alert and awake, oriented x3, not in any acute distress. CVS: S1-S2 clear, no murmur rubs or gallops Chest: CTAB Abdomen: soft NT, ND, NBS Extremities: no c/c/e   Lab Results: Basic Metabolic Panel:  Recent Labs Lab 04/22/13 1825 04/23/13 0638 04/23/13 1541  NA 134* 133*  --   K 5.8* 5.0  --   CL 93* 92*  --   CO2 21 17*  --   GLUCOSE 94 92  --   BUN 85* 84*  --   CREATININE 18.17* 18.98*  --   CALCIUM 9.7  9.2  --   PHOS  --   --  3.8   Liver Function Tests:  Recent Labs Lab 04/22/13 1825  AST 22  ALT 14  ALKPHOS 75  BILITOT 0.3  PROT 7.6  ALBUMIN 3.7   No results found for this basename: LIPASE, AMYLASE,  in the last 168 hours No results found for this basename: AMMONIA,  in the last 168 hours CBC:  Recent Labs Lab 04/22/13 1825 04/23/13 0638  WBC 5.1 4.2  HGB 10.5* 9.3*  HCT 32.0* 28.3*  MCV 90.1 90.4  PLT 233 205   Cardiac Enzymes: No results found for this basename: CKTOTAL, CKMB, CKMBINDEX, TROPONINI,  in the last 168 hours BNP: No components found with this basename: POCBNP,  CBG: No results found for this basename: GLUCAP,  in the last 168 hours   Micro Results: Recent Results (from the past 240 hour(s))  MRSA PCR SCREENING     Status: Abnormal   Collection Time    04/23/13  4:07 PM      Result Value Range Status   MRSA by PCR POSITIVE (*) NEGATIVE Final   Comment:            The GeneXpert MRSA Assay (FDA     approved for NASAL specimens     only), is one component of a     comprehensive MRSA colonization  surveillance program. It is not     intended to diagnose MRSA     infection nor to guide or     monitor treatment for     MRSA infections.     RESULT CALLED TO, READ BACK BY AND VERIFIED WITH:     RN C.MARSHALL AT 2114 04/23/13    Studies/Results: Dg Chest 2 View  04/22/2013   *RADIOLOGY REPORT*  Clinical Data: Chest pain, history of CHF  CHEST - 2 VIEW  Comparison: Prior radiograph from 04/09/2013  Findings: Mild cardiac enlargement is grossly stable as compared to the prior exam.  The lungs are well inflated.  Tiny left pleural effusion is present, improved as compared to the prior exam.  No definite right pleural effusion is now seen.  Pulmonary vascular congestion is also improved.  There is no overt pulmonary edema.  No pneumothorax.  No acute osseous abnormality.  IVC filter is noted.  IMPRESSION: Interval improvement in pulmonary vascular  congestion and bilateral pleural effusions, with small residual left pleural effusion.  No airspace consolidation.   Original Report Authenticated By: Rise Mu, M.D.   Dg Chest 2 View  04/09/2013   *RADIOLOGY REPORT*  Clinical Data: Headaches.  Shortness of breath.  CHEST - 2 VIEW  Comparison: PA and lateral chest 02/18/2013.  Findings: There is enlargement of the cardiopericardial silhouette. There are small bilateral pleural effusions.  Pulmonary vascular congestion without frank edema is noted.  No pneumothorax.  IMPRESSION:  1.  Enlarged cardiopericardial silhouette compatible with cardiomegaly and/or pericardial effusion. 2.  Small bilateral pleural effusions.   Original Report Authenticated By: Holley Dexter, M.D.   Ct Angio Chest Pe W/cm &/or Wo Cm  04/23/2013   *RADIOLOGY REPORT*  Clinical Data: Chest pain.  Short of breath.  Pulmonary embolism. End-stage renal disease.  CT ANGIOGRAPHY CHEST  Technique:  Multidetector CT imaging of the chest using the standard protocol during bolus administration of intravenous contrast. Multiplanar reconstructed images including MIPs were obtained and reviewed to evaluate the vascular anatomy.  Contrast: OMNIPAQUE IOHEXOL 350 MG/ML SOLN  Comparison: 11/12/2012.  Findings: Technically adequate study.  Negative for pulmonary embolus.  No acute aortic abnormality.  No axillary adenopathy. Cardiomegaly is present.  Incidental visualization of the upper abdomen is within normal limits.  No pericardial effusion.  No pleural effusion.  Lungs demonstrate dependent atelectasis.  There is no airspace disease. Central airways are patent.  IMPRESSION:  1.  Technically adequate study without pulmonary embolus or acute abnormality. 2.  Cardiomegaly. 3.  Trace left pleural effusion with dependent atelectasis.   Original Report Authenticated By: Andreas Newport, M.D.    Medications: Scheduled Meds: . amLODipine  5 mg Oral Daily  . calcium acetate  1,334 mg  Oral TID WC  . carvedilol  3.125 mg Oral BID WC  . Chlorhexidine Gluconate Cloth  6 each Topical Q0600  . enoxaparin (LOVENOX) injection  30 mg Subcutaneous Q24H  . famotidine  20 mg Oral Daily  . fluticasone  2 spray Each Nare Daily  . multivitamin  1 tablet Oral q morning - 10a  . mupirocin ointment  1 application Nasal BID  . ramipril  5 mg Oral BID  . sevelamer carbonate  800 mg Oral TID WC  . sodium chloride  3 mL Intravenous Q12H  . sodium chloride  3 mL Intravenous Q12H  . tuberculin  5 Units Intradermal Once      LOS: 2 days   Rashee Marschall M.D. [facility] 04/24/2013,  10:54 AM Pager: [phone]  If 7PM-7AM, please contact night-coverage [email or web address] Password [email or web address]

## 2013-04-25 DIAGNOSIS — R109 Unspecified abdominal pain: Secondary | ICD-10-CM

## 2013-04-25 LAB — BASIC METABOLIC PANEL
BUN: 32 mg/dL — ABNORMAL HIGH (ref 6–23)
CO2: 27 mEq/L (ref 19–32)
Calcium: 9.3 mg/dL (ref 8.4–10.5)
Creatinine, Ser: 10.9 mg/dL — ABNORMAL HIGH (ref 0.50–1.10)
GFR calc non Af Amer: 4 mL/min — ABNORMAL LOW (ref >90)
Glucose, Bld: 81 mg/dL (ref 70–99)
Sodium: 137 mEq/L (ref 135–145)

## 2013-04-25 LAB — CBC
MCH: 29.3 pg (ref 26.0–34.0)
MCHC: 32.2 g/dL (ref 30.0–36.0)
MCV: 91 fL (ref 78.0–100.0)
Platelets: 187 10*3/uL (ref 150–400)
RDW: 15.8 % — ABNORMAL HIGH (ref 11.5–15.5)

## 2013-04-25 MED ORDER — CARVEDILOL 6.25 MG PO TABS
6.2500 mg | ORAL_TABLET | Freq: Two times a day (BID) | ORAL | Status: DC
  Administered 2013-04-25 – 2013-05-01 (×11): 6.25 mg via ORAL
  Filled 2013-04-25 (×14): qty 1

## 2013-04-25 MED ORDER — DIPHENHYDRAMINE HCL 50 MG/ML IJ SOLN
INTRAMUSCULAR | Status: AC
  Administered 2013-04-25: 25 mg via INTRAVENOUS
  Filled 2013-04-25: qty 1

## 2013-04-25 MED ORDER — DIPHENHYDRAMINE HCL 50 MG/ML IJ SOLN: 25.0000 mg | Freq: Once | INTRAMUSCULAR | Status: AC

## 2013-04-25 MED ORDER — DIPHENHYDRAMINE HCL 25 MG PO CAPS
25.0000 mg | ORAL_CAPSULE | Freq: Three times a day (TID) | ORAL | Status: DC | PRN
  Administered 2013-04-25 – 2013-04-27 (×3): 25 mg via ORAL
  Filled 2013-04-25 (×3): qty 1

## 2013-04-25 NOTE — Progress Notes (Signed)
Subjective:  On hd,no cos , feeling better Objective Vital signs in last 24 hours: Filed Vitals:   04/25/13 0648 04/25/13 0715 04/25/13 0735 04/25/13 0800  BP: 151/102 154/95 147/82 146/85  Pulse: 78 67 75 86  Temp:      TempSrc:      Resp: 24 17 16 14   Height:      Weight:      SpO2:       Weight change: -0.127 kg (-4.5 oz)  Intake/Output Summary (Last 24 hours) at 04/25/13 0824 Last data filed at 04/24/13 1400  Gross per 24 hour  Intake    360 ml  Output      0 ml  Net    360 ml   Labs: Basic Metabolic Panel:  Recent Labs Lab 04/23/13 0638 04/23/13 1541 04/24/13 1716 04/25/13 0647  NA 133*  --  137 137  K 5.0  --  4.8 4.3  CL 92*  --  98 97  CO2 17*  --  26 27  GLUCOSE 92  --  88 81  BUN 84*  --  28* 32*  CREATININE 18.98*  --  9.34* 10.90*  CALCIUM 9.2  --  9.5 9.3  PHOS  --  3.8 7.0*  --    Liver Function Tests:  Recent Labs Lab 04/22/13 1825 04/24/13 1716  AST 22  --   ALT 14  --   ALKPHOS 75  --   BILITOT 0.3  --   PROT 7.6  --   ALBUMIN 3.7 3.6   No results found for this basename: LIPASE, AMYLASE,  in the last 168 hours No results found for this basename: AMMONIA,  in the last 168 hours CBC:  Recent Labs Lab 04/22/13 1825 04/23/13 0638 04/25/13 0647  WBC 5.1 4.2 4.2  HGB 10.5* 9.3* 9.4*  HCT 32.0* 28.3* 29.2*  MCV 90.1 90.4 91.0  PLT 233 205 187   Cardiac Enzymes: No results found for this basename: CKTOTAL, CKMB, CKMBINDEX, TROPONINI,  in the last 168 hours CBG: No results found for this basename: GLUCAP,  in the last 168 hours  Iron Studies: No results found for this basename: IRON, TIBC, TRANSFERRIN, FERRITIN,  in the last 72 hours Studies/Results: No results found. Medications:   . amLODipine  5 mg Oral Daily  . calcium acetate  1,334 mg Oral TID WC  . carvedilol  3.125 mg Oral BID WC  . Chlorhexidine Gluconate Cloth  6 each Topical Q0600  . enoxaparin (LOVENOX) injection  30 mg Subcutaneous Q24H  . famotidine  20 mg  Oral Daily  . fluticasone  2 spray Each Nare Daily  . multivitamin  1 tablet Oral q morning - 10a  . mupirocin ointment  1 application Nasal BID  . ramipril  5 mg Oral BID  . sevelamer carbonate  800 mg Oral TID WC  . sodium chloride  3 mL Intravenous Q12H  . sodium chloride  3 mL Intravenous Q12H  . tuberculin  5 Units Intradermal Once   Physical Exam:  General: Alert NAd young BF  On hd Heart: RRR, no rub or mur.  Lungs: CTA bilat.  Abdomen: BS Pos. Soft nontender  Extremities: Dialysis Access: Trace bipedall edema/ Pos. Bruit L U a avf   Dialysis Orders ([facility] TTS up until June 30, now discharged from [facility] care as of Jun 30 '14 due to noncompliance)  3hrs     69.5kg     2.25ca  Heparin none     LUA AVF  Zemplar     Epogen 3600   Assess/Plan  1 SOB- due to missed HD, vol excess some pedal edema and she is not wanting uf today dw her need for vol. Removal with htn and edema; no sob now improved w HD. HD planned again today  2 Hyperkalemia- resolved with hd 3 ESRD, discharged from [facility] on 6/30 after 30 day notice; gave her names/numbers of two other providers in the area [facility] [place], [employer]) at her request 4 Hypertension- cont BB and CCB  5 Anemia - hgb 9.4 Give aranesp q weekly  Hd when able 6 Metabolic bone disease -On Vit d and binders/ use phoslo as at home  [name] Pastel, PA-C [facility] Beeper [phone] 04/25/2013,8:24 AM  LOS: 3 days   Patient seen and examined.  I agree with plan as above with additions as indicated. Vinson Moselle  MD Pager [phone]    Cell  [phone] 04/25/2013, 10:03 AM

## 2013-04-25 NOTE — Progress Notes (Signed)
Patient requested to speak to a Child psychotherapist this afternoon; CSW went to see patient. She stated that she had been receiving dialysis at the [facility] but they had "fired her" due to non-compliance with going to her treatments.  She stated that she was anxious to leave the hospital and thought that the social worker was finding her another place to receive treatment.  CSW spoke with Genelle Bal, LCSW, Unit [phone]. She stated that the dialysis RN coordinator Darel Hong is there person who handles the "clipping" and arranging for dialysis center placement.  Patient states that she will accept any place offered but is not willing to go to [place]. Wants to remain in this area for her treatments.  [facility] and that they have received a referral for her but the coordinator is currently on vacation- thus she does not know the status of this referral.  Patient will follow up with Darel Hong in dialysis on Monday.   CSW signing off. Lorri Frederick. [place]  [phone]

## 2013-04-25 NOTE — Progress Notes (Signed)
04/25/2013 9:52 AM Hemodialysis Outpatient Note; I have faxed information to the [facility] attn;Dr Kolluru. Not sure if this is the closes center for the patient but I will follow up with [facility] on Monday.Thank you.Tilman Neat

## 2013-04-25 NOTE — Procedures (Signed)
I was present at this dialysis session. I have reviewed the session itself and made appropriate changes.   Vinson Moselle  MD Pager [phone]    Cell  [phone] 04/25/2013, 10:03 AM

## 2013-04-25 NOTE — ED Provider Notes (Signed)
Medical screening examination/treatment/procedure(s) were conducted as a shared visit with non-physician practitioner(s) and myself.  I personally evaluated the patient during the encounter.  Patient presents to the ER for evaluation of shortness of breath and chest pain. Patient is a dialysis patient and has been discharged from her dialysis center. She is volume overloaded and requiring dialysis. Patient will be admitted to the hospital, can be dialyzed in the morning.  Gilda Crease, MD 04/25/13 [phone]

## 2013-04-25 NOTE — Progress Notes (Signed)
Patient ID: [name] Small  female  ZOX:[id number]    DOB: [birth year]    DOA: 04/22/2013  PCP: Ellin Saba  Assessment/Plan: Principal Problem:   Fluid overload with history of end-stage renal disease on hemodialysis, noncompliant - Renal service following, hemodialysis back to back, x 3 daily, again today -No out-patient ctr available yet, Will defer to renal service for further recommendations, CLIP process started.  Active Problems:   ESRD (end stage renal disease): See #1    HTN (hypertension) - Increase coreg today. Cont altace  Allergies/sinusitis - Started on Flonase spray, feels better  Chronic combined systolic and diastolic CHF: - Cont HD for volume overload,  - cont coreg, ACEI  DVT Prophylaxis:  Code Status:  Disposition: Unclear, pending Out-patient dialysis approval    Subjective: Denies any specific complaints, feels better today, seen during HD, wants to go home  Objective: Weight change: -0.127 kg (-4.5 oz)  Intake/Output Summary (Last 24 hours) at 04/25/13 1038 Last data filed at 04/25/13 1035  Gross per 24 hour  Intake    240 ml  Output   1400 ml  Net  -1160 ml   Blood pressure 163/87, pulse 81, temperature 98.9 F (37.2 C), temperature source Oral, resp. rate 14, height 5\' 3"  (1.6 m), weight 63.4 kg (139 lb 12.4 oz), SpO2 98.00%.  Physical Exam: General: A x O x 3, NAD. CVS: S1-S2 clear, no mrg Chest: CTAB Abdomen: soft NT, ND, NBS Extremities: no c/c/e   Lab Results: Basic Metabolic Panel:  Recent Labs Lab 04/24/13 1716 04/25/13 0647  NA 137 137  K 4.8 4.3  CL 98 97  CO2 26 27  GLUCOSE 88 81  BUN 28* 32*  CREATININE 9.34* 10.90*  CALCIUM 9.5 9.3  PHOS 7.0*  --    Liver Function Tests:  Recent Labs Lab 04/22/13 1825 04/24/13 1716  AST 22  --   ALT 14  --   ALKPHOS 75  --   BILITOT 0.3  --   PROT 7.6  --   ALBUMIN 3.7 3.6   No results found for this basename: LIPASE, AMYLASE,  in the last 168 hours No results  found for this basename: AMMONIA,  in the last 168 hours CBC:  Recent Labs Lab 04/23/13 0638 04/25/13 0647  WBC 4.2 4.2  HGB 9.3* 9.4*  HCT 28.3* 29.2*  MCV 90.4 91.0  PLT 205 187   Cardiac Enzymes: No results found for this basename: CKTOTAL, CKMB, CKMBINDEX, TROPONINI,  in the last 168 hours BNP: No components found with this basename: POCBNP,  CBG: No results found for this basename: GLUCAP,  in the last 168 hours   Micro Results: Recent Results (from the past 240 hour(s))  MRSA PCR SCREENING     Status: Abnormal   Collection Time    04/23/13  4:07 PM      Result Value Range Status   MRSA by PCR POSITIVE (*) NEGATIVE Final   Comment:            The GeneXpert MRSA Assay (FDA     approved for NASAL specimens     only), is one component of a     comprehensive MRSA colonization     surveillance program. It is not     intended to diagnose MRSA     infection nor to guide or     monitor treatment for     MRSA infections.     RESULT CALLED TO, READ BACK BY AND VERIFIED  WITH:     RN C.MARSHALL AT 2114 04/23/13    Studies/Results: Dg Chest 2 View  04/22/2013   *RADIOLOGY REPORT*  Clinical Data: Chest pain, history of CHF  CHEST - 2 VIEW  Comparison: Prior radiograph from 04/09/2013  Findings: Mild cardiac enlargement is grossly stable as compared to the prior exam.  The lungs are well inflated.  Tiny left pleural effusion is present, improved as compared to the prior exam.  No definite right pleural effusion is now seen.  Pulmonary vascular congestion is also improved.  There is no overt pulmonary edema.  No pneumothorax.  No acute osseous abnormality.  IVC filter is noted.  IMPRESSION: Interval improvement in pulmonary vascular congestion and bilateral pleural effusions, with small residual left pleural effusion.  No airspace consolidation.   Original Report Authenticated By: Rise Mu, M.D.   Dg Chest 2 View  04/09/2013   *RADIOLOGY REPORT*  Clinical Data:  Headaches.  Shortness of breath.  CHEST - 2 VIEW  Comparison: PA and lateral chest 02/18/2013.  Findings: There is enlargement of the cardiopericardial silhouette. There are small bilateral pleural effusions.  Pulmonary vascular congestion without frank edema is noted.  No pneumothorax.  IMPRESSION:  1.  Enlarged cardiopericardial silhouette compatible with cardiomegaly and/or pericardial effusion. 2.  Small bilateral pleural effusions.   Original Report Authenticated By: Holley Dexter, M.D.   Ct Angio Chest Pe W/cm &/or Wo Cm  04/23/2013   *RADIOLOGY REPORT*  Clinical Data: Chest pain.  Short of breath.  Pulmonary embolism. End-stage renal disease.  CT ANGIOGRAPHY CHEST  Technique:  Multidetector CT imaging of the chest using the standard protocol during bolus administration of intravenous contrast. Multiplanar reconstructed images including MIPs were obtained and reviewed to evaluate the vascular anatomy.  Contrast: OMNIPAQUE IOHEXOL 350 MG/ML SOLN  Comparison: 11/12/2012.  Findings: Technically adequate study.  Negative for pulmonary embolus.  No acute aortic abnormality.  No axillary adenopathy. Cardiomegaly is present.  Incidental visualization of the upper abdomen is within normal limits.  No pericardial effusion.  No pleural effusion.  Lungs demonstrate dependent atelectasis.  There is no airspace disease. Central airways are patent.  IMPRESSION:  1.  Technically adequate study without pulmonary embolus or acute abnormality. 2.  Cardiomegaly. 3.  Trace left pleural effusion with dependent atelectasis.   Original Report Authenticated By: Andreas Newport, M.D.    Medications: Scheduled Meds: . amLODipine  5 mg Oral Daily  . calcium acetate  1,334 mg Oral TID WC  . carvedilol  3.125 mg Oral BID WC  . Chlorhexidine Gluconate Cloth  6 each Topical Q0600  . enoxaparin (LOVENOX) injection  30 mg Subcutaneous Q24H  . famotidine  20 mg Oral Daily  . fluticasone  2 spray Each Nare Daily  .  multivitamin  1 tablet Oral q morning - 10a  . mupirocin ointment  1 application Nasal BID  . ramipril  5 mg Oral BID  . sodium chloride  3 mL Intravenous Q12H  . sodium chloride  3 mL Intravenous Q12H  . tuberculin  5 Units Intradermal Once      LOS: 3 days   Khalidah Herbold M.D. [facility] 04/25/2013, 10:38 AM Pager: [phone]  If 7PM-7AM, please contact night-coverage [email or web address] Password [email or web address]

## 2013-04-26 ENCOUNTER — Inpatient Hospital Stay (HOSPITAL_COMMUNITY): Payer: Managed Care, Other (non HMO)

## 2013-04-26 NOTE — Plan of Care (Signed)
Problem: Phase I Progression Outcomes Goal: Uremic symptoms managed Outcome: Progressing C/o itching relieved by Benadryl.

## 2013-04-26 NOTE — Progress Notes (Addendum)
Subjective: No cos, " I want to change my diet to Regular"/ DW her need for ESRD needs/ appetite good since less uremic now   Objective Filed Vitals:   04/25/13 1800 04/25/13 2103 04/25/13 2358 04/26/13 0546  BP: 143/80 134/88 138/81 124/84  Pulse: 82 80 75 72  Temp: 98.6 F (37 C) 99 F (37.2 C) 98.4 F (36.9 C) 97.8 F (36.6 C)  TempSrc: Oral Oral Oral Oral  Resp: 16 18 15 17   Height:  5\' 3"  (1.6 m)    Weight:  64.8 kg (142 lb 13.7 oz)    SpO2: 96% 100% 100% 100%    CBC:  Recent Labs Lab 04/22/13 1825 04/23/13 0638 04/25/13 0647  WBC 5.1 4.2 4.2  HGB 10.5* 9.3* 9.4*  HCT 32.0* 28.3* 29.2*  MCV 90.1 90.4 91.0  PLT 233 205 187   Basic Metabolic Panel / Renal Panel:  Recent Labs Lab 04/23/13 0638 04/23/13 1541 04/24/13 1716 04/25/13 0647  NA 133*  --  137 137  K 5.0  --  4.8 4.3  CL 92*  --  98 97  CO2 17*  --  26 27  GLUCOSE 92  --  88 81  BUN 84*  --  28* 32*  CREATININE 18.98*  --  9.34* 10.90*  CALCIUM 9.2  --  9.5 9.3  PHOS  --  3.8 7.0*  --     Physical Exam:  Blood pressure 124/84, pulse 72, temperature 97.8 F (36.6 C), temperature source Oral, resp. rate 17, height 5\' 3"  (1.6 m), weight 64.8 kg (142 lb 13.7 oz), SpO2 100.00%. General: Alert NAd young BF No JVD  Heart: RRR, no rub or mur.  Lungs: CTA bilat.  Abdomen: BS Pos. Soft nontender , no HM Extremities: Dialysis Access: No pedal edema/ Pos. Bruit L U a avf   Dialysis Orders ([facility] until April 21, 2013) 3hrs      69.5kg EDW    2.25ca   Heparin none    LUA AVF  Zemplar   Epogen 3600   Assess/Plan  1 SOB- due to missed HD, vol excess; resolved 2 Hyperkalemia- resolved 3 CKD, discharged from [facility] practice on 6/30 4 Hypertension- cont acei, CCB; coreg added also. Wt down 5 Anemia - hgb 9.4 Give aranesp q weekly hd  6 Metabolic bone disease -On Vit d and binders   Lenny Pastel, PA-C [facility] [phone] 04/26/2013,8:28 AM  LOS: 4 days    Patient seen and examined.  Agree with assessment and plan as above. Vinson Moselle  MD Pager [phone]    Cell  [phone] 04/26/2013, 12:56 PM

## 2013-04-26 NOTE — Progress Notes (Signed)
Pt off unit accompanied by friend. RN notified.

## 2013-04-26 NOTE — Progress Notes (Signed)
Patient ID: [name] Small  female  ZOX:[id number]    DOB: 1991-04-09    DOA: 04/22/2013  PCP: Ellin Saba  Assessment/Plan: Principal Problem:   Fluid overload with history of end-stage renal disease on hemodialysis, noncompliant - Renal service following, hemodialysis back to back - No out-patient ctr available yet, CLIP process started.  Active Problems:   ESRD (end stage renal disease): See #1    HTN (hypertension) - BP stable, cont coreg and altace  Allergies/sinusitis - Started on Flonase spray, feels better  Chronic combined systolic and diastolic CHF: - Cont HD for volume overload,  - cont coreg, ACEI  DVT Prophylaxis:  Code Status:  Disposition: Unclear, pending Out-patient dialysis approval    Subjective: Denies any specific complaints, feels better today, family member at bed side   Objective: Weight change: -1.873 kg (-4 lb 2.1 oz)  Intake/Output Summary (Last 24 hours) at 04/26/13 1021 Last data filed at 04/25/13 2300  Gross per 24 hour  Intake    600 ml  Output   1400 ml  Net   -800 ml   Blood pressure 124/84, pulse 72, temperature 97.8 F (36.6 C), temperature source Oral, resp. rate 17, height 5\' 3"  (1.6 m), weight 64.8 kg (142 lb 13.7 oz), SpO2 100.00%.  Physical Exam: General: A x O x 3, NAD. CVS: S1-S2 clear, no mrg Chest: dec BS at bases, no wheezing Abdomen: soft NT, ND, NBS Extremities: no c/c/e   Lab Results: Basic Metabolic Panel:  Recent Labs Lab 04/24/13 1716 04/25/13 0647  NA 137 137  K 4.8 4.3  CL 98 97  CO2 26 27  GLUCOSE 88 81  BUN 28* 32*  CREATININE 9.34* 10.90*  CALCIUM 9.5 9.3  PHOS 7.0*  --    Liver Function Tests:  Recent Labs Lab 04/22/13 1825 04/24/13 1716  AST 22  --   ALT 14  --   ALKPHOS 75  --   BILITOT 0.3  --   PROT 7.6  --   ALBUMIN 3.7 3.6   No results found for this basename: LIPASE, AMYLASE,  in the last 168 hours No results found for this basename: AMMONIA,  in the last 168  hours CBC:  Recent Labs Lab 04/23/13 0638 04/25/13 0647  WBC 4.2 4.2  HGB 9.3* 9.4*  HCT 28.3* 29.2*  MCV 90.4 91.0  PLT 205 187   Cardiac Enzymes: No results found for this basename: CKTOTAL, CKMB, CKMBINDEX, TROPONINI,  in the last 168 hours BNP: No components found with this basename: POCBNP,  CBG: No results found for this basename: GLUCAP,  in the last 168 hours   Micro Results: Recent Results (from the past 240 hour(s))  MRSA PCR SCREENING     Status: Abnormal   Collection Time    04/23/13  4:07 PM      Result Value Range Status   MRSA by PCR POSITIVE (*) NEGATIVE Final   Comment:            The GeneXpert MRSA Assay (FDA     approved for NASAL specimens     only), is one component of a     comprehensive MRSA colonization     surveillance program. It is not     intended to diagnose MRSA     infection nor to guide or     monitor treatment for     MRSA infections.     RESULT CALLED TO, READ BACK BY AND VERIFIED WITH:  RN C.MARSHALL AT 2114 04/23/13    Studies/Results: Dg Chest 2 View  04/22/2013   *RADIOLOGY REPORT*  Clinical Data: Chest pain, history of CHF  CHEST - 2 VIEW  Comparison: Prior radiograph from 04/09/2013  Findings: Mild cardiac enlargement is grossly stable as compared to the prior exam.  The lungs are well inflated.  Tiny left pleural effusion is present, improved as compared to the prior exam.  No definite right pleural effusion is now seen.  Pulmonary vascular congestion is also improved.  There is no overt pulmonary edema.  No pneumothorax.  No acute osseous abnormality.  IVC filter is noted.  IMPRESSION: Interval improvement in pulmonary vascular congestion and bilateral pleural effusions, with small residual left pleural effusion.  No airspace consolidation.   Original Report Authenticated By: Rise Mu, M.D.   Dg Chest 2 View  04/09/2013   *RADIOLOGY REPORT*  Clinical Data: Headaches.  Shortness of breath.  CHEST - 2 VIEW   Comparison: PA and lateral chest 02/18/2013.  Findings: There is enlargement of the cardiopericardial silhouette. There are small bilateral pleural effusions.  Pulmonary vascular congestion without frank edema is noted.  No pneumothorax.  IMPRESSION:  1.  Enlarged cardiopericardial silhouette compatible with cardiomegaly and/or pericardial effusion. 2.  Small bilateral pleural effusions.   Original Report Authenticated By: Holley Dexter, M.D.   Ct Angio Chest Pe W/cm &/or Wo Cm  04/23/2013   *RADIOLOGY REPORT*  Clinical Data: Chest pain.  Short of breath.  Pulmonary embolism. End-stage renal disease.  CT ANGIOGRAPHY CHEST  Technique:  Multidetector CT imaging of the chest using the standard protocol during bolus administration of intravenous contrast. Multiplanar reconstructed images including MIPs were obtained and reviewed to evaluate the vascular anatomy.  Contrast: OMNIPAQUE IOHEXOL 350 MG/ML SOLN  Comparison: 11/12/2012.  Findings: Technically adequate study.  Negative for pulmonary embolus.  No acute aortic abnormality.  No axillary adenopathy. Cardiomegaly is present.  Incidental visualization of the upper abdomen is within normal limits.  No pericardial effusion.  No pleural effusion.  Lungs demonstrate dependent atelectasis.  There is no airspace disease. Central airways are patent.  IMPRESSION:  1.  Technically adequate study without pulmonary embolus or acute abnormality. 2.  Cardiomegaly. 3.  Trace left pleural effusion with dependent atelectasis.   Original Report Authenticated By: Andreas Newport, M.D.    Medications: Scheduled Meds: . amLODipine  5 mg Oral Daily  . calcium acetate  1,334 mg Oral TID WC  . carvedilol  6.25 mg Oral BID WC  . Chlorhexidine Gluconate Cloth  6 each Topical Q0600  . enoxaparin (LOVENOX) injection  30 mg Subcutaneous Q24H  . famotidine  20 mg Oral Daily  . fluticasone  2 spray Each Nare Daily  . multivitamin  1 tablet Oral q morning - 10a  . mupirocin  ointment  1 application Nasal BID  . ramipril  5 mg Oral BID  . sodium chloride  3 mL Intravenous Q12H  . sodium chloride  3 mL Intravenous Q12H  . tuberculin  5 Units Intradermal Once      LOS: 4 days   Altovise Wahler M.D. [facility] 04/26/2013, 10:21 AM Pager: [phone]  If 7PM-7AM, please contact night-coverage [email or web address] Password [email or web address]

## 2013-04-26 NOTE — Progress Notes (Deleted)
Subjective:  No cos, " I want to change my diet to Regular"/ DW her need for ESRD needs/ appetite good since less uremic now  Objective Vital signs in last 24 hours: Filed Vitals:   04/25/13 1800 04/25/13 2103 04/25/13 2358 04/26/13 0546  BP: 143/80 134/88 138/81 124/84  Pulse: 82 80 75 72  Temp: 98.6 F (37 C) 99 F (37.2 C) 98.4 F (36.9 C) 97.8 F (36.6 C)  TempSrc: Oral Oral Oral Oral  Resp: 16 18 15 17   Height:  5\' 3"  (1.6 m)    Weight:  64.8 kg (142 lb 13.7 oz)    SpO2: 96% 100% 100% 100%   Weight change: -1.873 kg (-4 lb 2.1 oz)  Intake/Output Summary (Last 24 hours) at 04/26/13 0828 Last data filed at 04/25/13 2300  Gross per 24 hour  Intake    840 ml  Output   1400 ml  Net   -560 ml   Labs: Basic Metabolic Panel:  Recent Labs Lab 04/23/13 0638 04/23/13 1541 04/24/13 1716 04/25/13 0647  NA 133*  --  137 137  K 5.0  --  4.8 4.3  CL 92*  --  98 97  CO2 17*  --  26 27  GLUCOSE 92  --  88 81  BUN 84*  --  28* 32*  CREATININE 18.98*  --  9.34* 10.90*  CALCIUM 9.2  --  9.5 9.3  PHOS  --  3.8 7.0*  --    Liver Function Tests:  Recent Labs Lab 04/22/13 1825 04/24/13 1716  AST 22  --   ALT 14  --   ALKPHOS 75  --   BILITOT 0.3  --   PROT 7.6  --   ALBUMIN 3.7 3.6     Recent Labs Lab 04/22/13 1825 04/23/13 0638 04/25/13 0647  WBC 5.1 4.2 4.2  HGB 10.5* 9.3* 9.4*  HCT 32.0* 28.3* 29.2*  MCV 90.1 90.4 91.0  PLT 233 205 187    . Physical Exam:  General: Alert NAd young BF  Heart: RRR, no rub or mur.  Lungs: CTA bilat.  Abdomen: BS Pos. Soft nontender  Extremities: Dialysis Access: No pedal edema/ Pos. Bruit L U a avf   Dialysis Orders ([facility] TTS up until June 30, now discharged from [facility] care as of Jun 30 '14).  3hrs42min 69.5kg 2.25ca Heparin none LUA AVF  Zemplar Epogen 3600   Assess/Plan  1 SOB- due to missed HD, vol excess; improved w HD. 100% o2 sat rm air / CXR pl. Effusions mostly resolved 2 Hyperkalemia- resolved with  hd 3 CKD, discharged from [facility] on 6/30 after 30 day notice. Says she is looking for neph group  4 Hypertension- cont BB and CCB  5 Anemia - hgb 9.4 Give aranesp q weekly hd  6 Metabolic bone disease -On Vit d and binders   Lenny Pastel, PA-C [facility] [phone] 04/26/2013,8:28 AM  LOS: 4 days

## 2013-04-27 DIAGNOSIS — E8779 Other fluid overload: Secondary | ICD-10-CM

## 2013-04-27 DIAGNOSIS — N186 End stage renal disease: Secondary | ICD-10-CM

## 2013-04-27 DIAGNOSIS — I10 Essential (primary) hypertension: Secondary | ICD-10-CM

## 2013-04-27 MED ORDER — DIPHENHYDRAMINE HCL 50 MG/ML IJ SOLN
25.0000 mg | Freq: Three times a day (TID) | INTRAMUSCULAR | Status: DC | PRN
  Administered 2013-04-27 – 2013-04-29 (×4): 25 mg via INTRAVENOUS
  Filled 2013-04-27 (×4): qty 1

## 2013-04-27 NOTE — Progress Notes (Signed)
Subjective:  No cos Objective Vital signs in last 24 hours: Filed Vitals:   04/26/13 1727 04/26/13 2129 04/27/13 0500 04/27/13 0912  BP: 128/87 159/91 127/79 135/76  Pulse: 76 85 85 74  Temp: 98.6 F (37 C) 98.2 F (36.8 C) 97.2 F (36.2 C) 98.1 F (36.7 C)  TempSrc: Oral Oral Oral Oral  Resp: 17 16 18 17   Height:      Weight:  64.5 kg (142 lb 3.2 oz)    SpO2: 100% 100% 99% 100%   Weight change: 1.1 kg (2 lb 6.8 oz)  Intake/Output Summary (Last 24 hours) at 04/27/13 1015 Last data filed at 04/27/13 0730  Gross per 24 hour  Intake    480 ml  Output      0 ml  Net    480 ml   Labs: Basic Metabolic Panel:  Recent Labs Lab 04/23/13 0638 04/23/13 1541 04/24/13 1716 04/25/13 0647  NA 133*  --  137 137  K 5.0  --  4.8 4.3  CL 92*  --  98 97  CO2 17*  --  26 27  GLUCOSE 92  --  88 81  BUN 84*  --  28* 32*  CREATININE 18.98*  --  9.34* 10.90*  CALCIUM 9.2  --  9.5 9.3  PHOS  --  3.8 7.0*  --    Liver Function Tests:  Recent Labs Lab 04/22/13 1825 04/24/13 1716  AST 22  --   ALT 14  --   ALKPHOS 75  --   BILITOT 0.3  --   PROT 7.6  --   ALBUMIN 3.7 3.6   No results found for this basename: LIPASE, AMYLASE,  in the last 168 hours No results found for this basename: AMMONIA,  in the last 168 hours CBC:  Recent Labs Lab 04/22/13 1825 04/23/13 0638 04/25/13 0647  WBC 5.1 4.2 4.2  HGB 10.5* 9.3* 9.4*  HCT 32.0* 28.3* 29.2*  MCV 90.1 90.4 91.0  PLT 233 205 187   Cardiac Enzymes: No results found for this basename: CKTOTAL, CKMB, CKMBINDEX, TROPONINI,  in the last 168 hours CBG: No results found for this basename: GLUCAP,  in the last 168 hours  Iron Studies: No results found for this basename: IRON, TIBC, TRANSFERRIN, FERRITIN,  in the last 72 hours Studies/Results: Dg Chest 2 View  04/26/2013   *RADIOLOGY REPORT*  Clinical Data: Cough and congestion. Rule out pneumonia.  CHEST - 2 VIEW  Comparison: 04/23/2013  Findings: There is mild cardiac  enlargement.  No pleural effusions or pulmonary edema.  No airspace consolidation identified.  The visualized osseous structures are unremarkable.  IMPRESSION:  1.  Cardiac enlargement. 2.  No evidence for pneumonia.   Original Report Authenticated By: Signa Kell, M.D.   Medications:   . amLODipine  5 mg Oral Daily  . calcium acetate  1,334 mg Oral TID WC  . carvedilol  6.25 mg Oral BID WC  . Chlorhexidine Gluconate Cloth  6 each Topical Q0600  . enoxaparin (LOVENOX) injection  30 mg Subcutaneous Q24H  . famotidine  20 mg Oral Daily  . fluticasone  2 spray Each Nare Daily  . multivitamin  1 tablet Oral q morning - 10a  . mupirocin ointment  1 application Nasal BID  . ramipril  5 mg Oral BID  . sodium chloride  3 mL Intravenous Q12H  . sodium chloride  3 mL Intravenous Q12H  . tuberculin  5 Units Intradermal Once   I  have reviewed scheduled and prn medications.  Physical Exam: General: Alert NAd young BF  Heart: RRR, no rub or mur.  Lungs: CTA bilat.  Abdomen: BS Pos. Soft nontender  Extremities: Dialysis Access: No pedal edema/ Pos. Bruit LUA AVF  Outpatient HD (was [facility] until April 21, 2013)  3hrs 69.5kg EDW 2.25ca Heparin none LUA AVF  Zemplar Epogen 3600   Assess/Plan 1 SOB- due to missed HD, vol excess; resolved  2 Hyperkalemia- resolved with hd  3 CKD, discharged from [facility] on 6/30 / next hd in am MWF ( schedule in hosp.) 4 Hypertension- cont acei, CCB; coreg added also. Wt down  5 Anemia - hgb 9.4 Give aranesp q weekly hd  6 Metabolic bone disease -On Vit d and binders  Lenny Pastel, PA-C [facility] Beeper [phone] 04/27/2013,10:15 AM  LOS: 5 days   Patient seen and examined.  Agree with assessment and plan as above. Vinson Moselle  MD Pager [phone]    Cell  [phone] 04/27/2013, 2:57 PM

## 2013-04-27 NOTE — Progress Notes (Signed)
Patient ID: [name] Small  female  WUJ:[id number]    DOB: 1991/09/14    DOA: 04/22/2013  PCP: Ellin Saba  Assessment/Plan: Principal Problem:   Fluid overload with history of end-stage renal disease on hemodialysis, severely noncompliant with out-pt Rx, has been dc'ed from out-patient Dialysis ctr - Renal service following, hemodialysis in the hospital, she initially had 3 HD's back-to-back for the fluid overload. - No out-patient ctr available yet, CLIP process started.  Active Problems:   ESRD (end stage renal disease): See #1    HTN (hypertension) - BP stable, cont coreg and altace  Allergies/sinusitis - Started on Flonase spray, feels better  Chronic combined systolic and diastolic CHF: - Cont HD for volume overload, coreg, ACEI  DVT Prophylaxis:  Code Status:  Disposition: Unclear, pending Out-patient dialysis approval    Subjective: Denies any specific complaints, feels better. Her appetite has improved after starting dialysis   Objective: Weight change: 1.1 kg (2 lb 6.8 oz)  Intake/Output Summary (Last 24 hours) at 04/27/13 1125 Last data filed at 04/27/13 0730  Gross per 24 hour  Intake    480 ml  Output      0 ml  Net    480 ml   Blood pressure 135/76, pulse 74, temperature 98.1 F (36.7 C), temperature source Oral, resp. rate 17, height 5\' 3"  (1.6 m), weight 64.5 kg (142 lb 3.2 oz), SpO2 100.00%.  Physical Exam: General: A x O x 3, NAD. CVS: S1-S2 clear, no mrg Chest: CTA B. Abdomen: soft NT, ND, NBS Extremities: no c/c/e   Lab Results: Basic Metabolic Panel:  Recent Labs Lab 04/24/13 1716 04/25/13 0647  NA 137 137  K 4.8 4.3  CL 98 97  CO2 26 27  GLUCOSE 88 81  BUN 28* 32*  CREATININE 9.34* 10.90*  CALCIUM 9.5 9.3  PHOS 7.0*  --    Liver Function Tests:  Recent Labs Lab 04/22/13 1825 04/24/13 1716  AST 22  --   ALT 14  --   ALKPHOS 75  --   BILITOT 0.3  --   PROT 7.6  --   ALBUMIN 3.7 3.6   No results found for this  basename: LIPASE, AMYLASE,  in the last 168 hours No results found for this basename: AMMONIA,  in the last 168 hours CBC:  Recent Labs Lab 04/23/13 0638 04/25/13 0647  WBC 4.2 4.2  HGB 9.3* 9.4*  HCT 28.3* 29.2*  MCV 90.4 91.0  PLT 205 187   Cardiac Enzymes: No results found for this basename: CKTOTAL, CKMB, CKMBINDEX, TROPONINI,  in the last 168 hours BNP: No components found with this basename: POCBNP,  CBG: No results found for this basename: GLUCAP,  in the last 168 hours   Micro Results: Recent Results (from the past 240 hour(s))  MRSA PCR SCREENING     Status: Abnormal   Collection Time    04/23/13  4:07 PM      Result Value Range Status   MRSA by PCR POSITIVE (*) NEGATIVE Final   Comment:            The GeneXpert MRSA Assay (FDA     approved for NASAL specimens     only), is one component of a     comprehensive MRSA colonization     surveillance program. It is not     intended to diagnose MRSA     infection nor to guide or     monitor treatment for  MRSA infections.     RESULT CALLED TO, READ BACK BY AND VERIFIED WITH:     RN C.MARSHALL AT 2114 04/23/13    Studies/Results: Dg Chest 2 View  04/22/2013   *RADIOLOGY REPORT*  Clinical Data: Chest pain, history of CHF  CHEST - 2 VIEW  Comparison: Prior radiograph from 04/09/2013  Findings: Mild cardiac enlargement is grossly stable as compared to the prior exam.  The lungs are well inflated.  Tiny left pleural effusion is present, improved as compared to the prior exam.  No definite right pleural effusion is now seen.  Pulmonary vascular congestion is also improved.  There is no overt pulmonary edema.  No pneumothorax.  No acute osseous abnormality.  IVC filter is noted.  IMPRESSION: Interval improvement in pulmonary vascular congestion and bilateral pleural effusions, with small residual left pleural effusion.  No airspace consolidation.   Original Report Authenticated By: Rise Mu, M.D.   Dg Chest 2  View  04/09/2013   *RADIOLOGY REPORT*  Clinical Data: Headaches.  Shortness of breath.  CHEST - 2 VIEW  Comparison: PA and lateral chest 02/18/2013.  Findings: There is enlargement of the cardiopericardial silhouette. There are small bilateral pleural effusions.  Pulmonary vascular congestion without frank edema is noted.  No pneumothorax.  IMPRESSION:  1.  Enlarged cardiopericardial silhouette compatible with cardiomegaly and/or pericardial effusion. 2.  Small bilateral pleural effusions.   Original Report Authenticated By: Holley Dexter, M.D.   Ct Angio Chest Pe W/cm &/or Wo Cm  04/23/2013   *RADIOLOGY REPORT*  Clinical Data: Chest pain.  Short of breath.  Pulmonary embolism. End-stage renal disease.  CT ANGIOGRAPHY CHEST  Technique:  Multidetector CT imaging of the chest using the standard protocol during bolus administration of intravenous contrast. Multiplanar reconstructed images including MIPs were obtained and reviewed to evaluate the vascular anatomy.  Contrast: OMNIPAQUE IOHEXOL 350 MG/ML SOLN  Comparison: 11/12/2012.  Findings: Technically adequate study.  Negative for pulmonary embolus.  No acute aortic abnormality.  No axillary adenopathy. Cardiomegaly is present.  Incidental visualization of the upper abdomen is within normal limits.  No pericardial effusion.  No pleural effusion.  Lungs demonstrate dependent atelectasis.  There is no airspace disease. Central airways are patent.  IMPRESSION:  1.  Technically adequate study without pulmonary embolus or acute abnormality. 2.  Cardiomegaly. 3.  Trace left pleural effusion with dependent atelectasis.   Original Report Authenticated By: Andreas Newport, M.D.    Medications: Scheduled Meds: . amLODipine  5 mg Oral Daily  . calcium acetate  1,334 mg Oral TID WC  . carvedilol  6.25 mg Oral BID WC  . Chlorhexidine Gluconate Cloth  6 each Topical Q0600  . enoxaparin (LOVENOX) injection  30 mg Subcutaneous Q24H  . famotidine  20 mg Oral  Daily  . fluticasone  2 spray Each Nare Daily  . multivitamin  1 tablet Oral q morning - 10a  . mupirocin ointment  1 application Nasal BID  . ramipril  5 mg Oral BID  . sodium chloride  3 mL Intravenous Q12H  . sodium chloride  3 mL Intravenous Q12H  . tuberculin  5 Units Intradermal Once      LOS: 5 days   RAI,RIPUDEEP M.D. [facility] 04/27/2013, 11:25 AM Pager: [phone]  If 7PM-7AM, please contact night-coverage [email or web address] Password [email or web address]

## 2013-04-28 LAB — BASIC METABOLIC PANEL
Calcium: 9.5 mg/dL (ref 8.4–10.5)
Creatinine, Ser: 12.74 mg/dL — ABNORMAL HIGH (ref 0.50–1.10)
GFR calc non Af Amer: 4 mL/min — ABNORMAL LOW (ref >90)
Sodium: 136 mEq/L (ref 135–145)

## 2013-04-28 LAB — CBC
HCT: 28.8 % — ABNORMAL LOW (ref 36.0–46.0)
MCHC: 32.6 g/dL (ref 30.0–36.0)
MCV: 90.3 fL (ref 78.0–100.0)
Platelets: 187 10*3/uL (ref 150–400)
RDW: 14.9 % (ref 11.5–15.5)
WBC: 5.4 10*3/uL (ref 4.0–10.5)

## 2013-04-28 MED ORDER — DOXERCALCIFEROL 4 MCG/2ML IV SOLN
INTRAVENOUS | Status: AC
  Administered 2013-04-28: 8 ug via INTRAVENOUS
  Filled 2013-04-28: qty 4

## 2013-04-28 MED ORDER — DOXERCALCIFEROL 4 MCG/2ML IV SOLN
8.0000 ug | INTRAVENOUS | Status: DC
  Filled 2013-04-28: qty 4

## 2013-04-28 NOTE — Progress Notes (Signed)
I had a long discussion with her about behavioral issues and medication compliance.  She seems very nice but lacks insight regarding ESRD management. Calley Drenning C

## 2013-04-28 NOTE — Progress Notes (Signed)
[facility] PROGRESS NOTE  [name] Small HYQ:[id number] DOB: 12-27-1990 DOA: 04/22/2013 PCP: Ellin Saba  Assessment/Plan: Principal Problem:   Fluid overload Active Problems:   ESRD (end stage renal disease)   HTN (hypertension)    Fluid overload with history of end-stage renal disease on hemodialysis, severely noncompliant with out-pt Rx, has been dc'ed from out-patient Dialysis ctr  - Renal service following, hemodialysis in the hospital, she initially had 3 HD's back-to-back for the fluid overload.  - No out-patient ctr available yet, CLIP process started.  CKD, discharged from [facility] practice on 6/30 / next hd in am MWF ( schedule in hosp    ESRD (end stage renal disease): See #1   HTN (hypertension)  - BP stable, cont coreg and altace   Allergies/sinusitis  - Started on Flonase spray, feels better   Chronic combined systolic and diastolic CHF:  - Cont HD for volume overload, coreg, ACEI    DVT Prophylaxis:  Code Status:  Disposition: Unclear, pending Out-patient dialysis approval  Subjective:  Denies any specific complaints, feels better. Her appetite has improved after starting dialysis      Objective: Filed Vitals:   04/27/13 0500 04/27/13 0912 04/27/13 2145 04/28/13 0500  BP: 127/79 135/76 137/88 116/74  Pulse: 85 74 70 73  Temp: 97.2 F (36.2 C) 98.1 F (36.7 C) 98.3 F (36.8 C) 98.8 F (37.1 C)  TempSrc: Oral Oral Oral Oral  Resp: 18 17 18 18   Height:      Weight:   63.4 kg (139 lb 12.4 oz)   SpO2: 99% 100% 98% 99%   No intake or output data in the 24 hours ending 04/28/13 0848  Exam: General: Alert NAd young BF  Heart: RRR, no rub or mur.  Lungs: CTA bilat.  Abdomen: BS Pos. Soft nontender  Extremities: Dialysis Access: No pedal edema/ Pos. Bruit LUA AVF    Data Reviewed: Basic Metabolic Panel:  Recent Labs Lab 04/22/13 1825 04/23/13 0638 04/23/13 1541 04/24/13 1716 04/25/13 0647  NA 134* 133*  --  137 137  K 5.8* 5.0   --  4.8 4.3  CL 93* 92*  --  98 97  CO2 21 17*  --  26 27  GLUCOSE 94 92  --  88 81  BUN 85* 84*  --  28* 32*  CREATININE 18.17* 18.98*  --  9.34* 10.90*  CALCIUM 9.7 9.2  --  9.5 9.3  PHOS  --   --  3.8 7.0*  --     Liver Function Tests:  Recent Labs Lab 04/22/13 1825 04/24/13 1716  AST 22  --   ALT 14  --   ALKPHOS 75  --   BILITOT 0.3  --   PROT 7.6  --   ALBUMIN 3.7 3.6   No results found for this basename: LIPASE, AMYLASE,  in the last 168 hours No results found for this basename: AMMONIA,  in the last 168 hours  CBC:  Recent Labs Lab 04/22/13 1825 04/23/13 0638 04/25/13 0647  WBC 5.1 4.2 4.2  HGB 10.5* 9.3* 9.4*  HCT 32.0* 28.3* 29.2*  MCV 90.1 90.4 91.0  PLT 233 205 187    Cardiac Enzymes: No results found for this basename: CKTOTAL, CKMB, CKMBINDEX, TROPONINI,  in the last 168 hours BNP (last 3 results)  Recent Labs  04/09/13 1005  PROBNP >70000.0*     CBG: No results found for this basename: GLUCAP,  in the last 168 hours  Recent Results (from  the past 240 hour(s))  MRSA PCR SCREENING     Status: Abnormal   Collection Time    04/23/13  4:07 PM      Result Value Range Status   MRSA by PCR POSITIVE (*) NEGATIVE Final   Comment:            The GeneXpert MRSA Assay (FDA     approved for NASAL specimens     only), is one component of a     comprehensive MRSA colonization     surveillance program. It is not     intended to diagnose MRSA     infection nor to guide or     monitor treatment for     MRSA infections.     RESULT CALLED TO, READ BACK BY AND VERIFIED WITH:     RN C.MARSHALL AT 2114 04/23/13     Studies: Dg Chest 2 View  04/26/2013   *RADIOLOGY REPORT*  Clinical Data: Cough and congestion. Rule out pneumonia.  CHEST - 2 VIEW  Comparison: 04/23/2013  Findings: There is mild cardiac enlargement.  No pleural effusions or pulmonary edema.  No airspace consolidation identified.  The visualized osseous structures are unremarkable.   IMPRESSION:  1.  Cardiac enlargement. 2.  No evidence for pneumonia.   Original Report Authenticated By: Signa Kell, M.D.   Dg Chest 2 View  04/22/2013   *RADIOLOGY REPORT*  Clinical Data: Chest pain, history of CHF  CHEST - 2 VIEW  Comparison: Prior radiograph from 04/09/2013  Findings: Mild cardiac enlargement is grossly stable as compared to the prior exam.  The lungs are well inflated.  Tiny left pleural effusion is present, improved as compared to the prior exam.  No definite right pleural effusion is now seen.  Pulmonary vascular congestion is also improved.  There is no overt pulmonary edema.  No pneumothorax.  No acute osseous abnormality.  IVC filter is noted.  IMPRESSION: Interval improvement in pulmonary vascular congestion and bilateral pleural effusions, with small residual left pleural effusion.  No airspace consolidation.   Original Report Authenticated By: Rise Mu, M.D.   Dg Chest 2 View  04/09/2013   *RADIOLOGY REPORT*  Clinical Data: Headaches.  Shortness of breath.  CHEST - 2 VIEW  Comparison: PA and lateral chest 02/18/2013.  Findings: There is enlargement of the cardiopericardial silhouette. There are small bilateral pleural effusions.  Pulmonary vascular congestion without frank edema is noted.  No pneumothorax.  IMPRESSION:  1.  Enlarged cardiopericardial silhouette compatible with cardiomegaly and/or pericardial effusion. 2.  Small bilateral pleural effusions.   Original Report Authenticated By: Holley Dexter, M.D.   Ct Angio Chest Pe W/cm &/or Wo Cm  04/23/2013   *RADIOLOGY REPORT*  Clinical Data: Chest pain.  Short of breath.  Pulmonary embolism. End-stage renal disease.  CT ANGIOGRAPHY CHEST  Technique:  Multidetector CT imaging of the chest using the standard protocol during bolus administration of intravenous contrast. Multiplanar reconstructed images including MIPs were obtained and reviewed to evaluate the vascular anatomy.  Contrast: OMNIPAQUE IOHEXOL  350 MG/ML SOLN  Comparison: 11/12/2012.  Findings: Technically adequate study.  Negative for pulmonary embolus.  No acute aortic abnormality.  No axillary adenopathy. Cardiomegaly is present.  Incidental visualization of the upper abdomen is within normal limits.  No pericardial effusion.  No pleural effusion.  Lungs demonstrate dependent atelectasis.  There is no airspace disease. Central airways are patent.  IMPRESSION:  1.  Technically adequate study without pulmonary embolus or acute abnormality. 2.  Cardiomegaly. 3.  Trace left pleural effusion with dependent atelectasis.   Original Report Authenticated By: Andreas Newport, M.D.    Scheduled Meds: . amLODipine  5 mg Oral Daily  . calcium acetate  1,334 mg Oral TID WC  . carvedilol  6.25 mg Oral BID WC  . enoxaparin (LOVENOX) injection  30 mg Subcutaneous Q24H  . famotidine  20 mg Oral Daily  . fluticasone  2 spray Each Nare Daily  . multivitamin  1 tablet Oral q morning - 10a  . mupirocin ointment  1 application Nasal BID  . ramipril  5 mg Oral BID  . sodium chloride  3 mL Intravenous Q12H  . sodium chloride  3 mL Intravenous Q12H  . tuberculin  5 Units Intradermal Once   Continuous Infusions:   Principal Problem:   Fluid overload Active Problems:   ESRD (end stage renal disease)   HTN (hypertension)    Time spent: 40 minutes   [facility]  [facility] Pager [phone]. If 8PM-8AM, please contact night-coverage at [email or web address], password [facility] 04/28/2013, 8:48 AM  LOS: 6 days

## 2013-04-28 NOTE — Progress Notes (Signed)
[facility] Progress Note  Subjective:   Sitting up in bed. Endorses some dizziness and post HD cramping on Friday but no c/o's today.  Objective Filed Vitals:   04/27/13 0912 04/27/13 2145 04/28/13 0500 04/28/13 0921  BP: 135/76 137/88 116/74 134/94  Pulse: 74 70 73 76  Temp: 98.1 F (36.7 C) 98.3 F (36.8 C) 98.8 F (37.1 C) 98.4 F (36.9 C)  TempSrc: Oral Oral Oral   Resp: 17 18 18 18   Height:      Weight:  63.4 kg (139 lb 12.4 oz)    SpO2: 100% 98% 99% 100%   Physical Exam General: Alert, cooperative, NAD Heart: RRR Lungs: CTA bilaterally, no wheezes, rales, rhonchi Abdomen: soft, NT, non -distended, normal BS Extremities: No LE edema Dialysis Access: LUA AVF + bruit  Outpatient HD (was [facility] until April 21, 2013)  3hrs 69.5kg EDW 2.25ca Heparin none LUA AVF  Zemplar Epogen 3600.  Last op post wgt was 62.5kg  Assessment/Plan: 1. SOB/Hyperkalemia - due to missed HD/volume excess, resolved with serial HD 2. ESRD - Formerly TTS at [facility], discharged from [facility] as of 04/21/2013. Awaiting acceptance at [facility] unit. Will dialyze MWF while admitted. HD this afternoon. 3. Anemia - Hgb 9.4, trending down since admit. On Aranesp 60 q week. 4. Secondary hyperparathyroidism - Ca 9.5 (9.8 corrected) ^ Phos. Last op PTH 600 in May - last op Hectorol dose was 8 mcg. Continue and recheck PTH  5. HTN/volume - BP 110s-130s. Continue Amlodipine, ACEI and coreg. Last op post wgt was 62.5 kg - slightly above that here.  Standing pre/post wgts and titrate until d/c.  6. Nutrition - Albumin 3.6.  Renal diet/multivitamin 7. Dispo - Pending acceptance at an op HD unit  Clydie Braun E. Thad Ranger [facility] Pager [phone] 04/28/2013,11:07 AM  LOS: 6 days    Additional Objective Labs: Basic Metabolic Panel:  Recent Labs Lab 04/23/13 1541 04/24/13 1716 04/25/13 0647 04/28/13 0915  NA  --  137 137 136  K  --  4.8 4.3 4.6  CL  --  98 97 95*  CO2  --   26 27 30   GLUCOSE  --  88 81 121*  BUN  --  28* 32* 39*  CREATININE  --  9.34* 10.90* 12.74*  CALCIUM  --  9.5 9.3 9.5  PHOS 3.8 7.0*  --   --    Liver Function Tests:  Recent Labs Lab 04/22/13 1825 04/24/13 1716  AST 22  --   ALT 14  --   ALKPHOS 75  --   BILITOT 0.3  --   PROT 7.6  --   ALBUMIN 3.7 3.6   No results found for this basename: LIPASE, AMYLASE,  in the last 168 hours CBC:  Recent Labs Lab 04/22/13 1825 04/23/13 0638 04/25/13 0647  WBC 5.1 4.2 4.2  HGB 10.5* 9.3* 9.4*  HCT 32.0* 28.3* 29.2*  MCV 90.1 90.4 91.0  PLT 233 205 187   Blood Culture    Component Value Date/Time   SDES WOUND FACE RIGHT 03/21/2013 1320   SPECREQUEST RIGHT CHEEK 03/21/2013 1320   CULT MULTIPLE ORGANISMS PRESENT, NONE PREDOMINANT NO STAPHYLOCOCCUS AUREUS ISOLATED NO GROUP A STREP (S.PYOGENES) ISOLATED 03/21/2013 1320   REPTSTATUS 03/23/2013 FINAL 03/21/2013 1320    Cardiac Enzymes: No results found for this basename: CKTOTAL, CKMB, CKMBINDEX, TROPONINI,  in the last 168 hours CBG: No results found for this basename: GLUCAP,  in the last 168  hours Iron Studies: No results found for this basename: IRON, TIBC, TRANSFERRIN, FERRITIN,  in the last 72 hours @lablastinr3 @ Studies/Results: No results found. Medications:   . amLODipine  5 mg Oral Daily  . calcium acetate  1,334 mg Oral TID WC  . carvedilol  6.25 mg Oral BID WC  . enoxaparin (LOVENOX) injection  30 mg Subcutaneous Q24H  . famotidine  20 mg Oral Daily  . fluticasone  2 spray Each Nare Daily  . multivitamin  1 tablet Oral q morning - 10a  . mupirocin ointment  1 application Nasal BID  . ramipril  5 mg Oral BID  . sodium chloride  3 mL Intravenous Q12H  . sodium chloride  3 mL Intravenous Q12H  . tuberculin  5 Units Intradermal Once

## 2013-04-29 LAB — CREATININE, SERUM
GFR calc Af Amer: 7 mL/min — ABNORMAL LOW (ref >90)
GFR calc non Af Amer: 6 mL/min — ABNORMAL LOW (ref >90)

## 2013-04-29 LAB — PARATHYROID HORMONE, INTACT (NO CA): PTH: 779 pg/mL — ABNORMAL HIGH (ref 14.0–72.0)

## 2013-04-29 MED ORDER — CAMPHOR-MENTHOL 0.5-0.5 % EX LOTN
TOPICAL_LOTION | CUTANEOUS | Status: DC | PRN
  Administered 2013-04-30: 02:00:00 via TOPICAL
  Filled 2013-04-29: qty 222

## 2013-04-29 MED ORDER — DIPHENHYDRAMINE HCL 25 MG PO CAPS
25.0000 mg | ORAL_CAPSULE | Freq: Four times a day (QID) | ORAL | Status: DC | PRN
  Administered 2013-04-30 – 2013-05-01 (×3): 25 mg via ORAL
  Filled 2013-04-29 (×3): qty 1

## 2013-04-29 MED ORDER — DIPHENHYDRAMINE HCL 25 MG PO CAPS
25.0000 mg | ORAL_CAPSULE | Freq: Three times a day (TID) | ORAL | Status: DC | PRN
  Administered 2013-04-29: 25 mg via ORAL
  Filled 2013-04-29: qty 1

## 2013-04-29 NOTE — Progress Notes (Signed)
[facility] PROGRESS NOTE  [name] Small ZOX:[id number] DOB: 1991/04/12 DOA: 04/22/2013 PCP: Ellin Saba  Assessment/Plan: Principal Problem:   Fluid overload Active Problems:   ESRD (end stage renal disease)   HTN (hypertension)  Fluid overload with history of end-stage renal disease on hemodialysis, severely noncompliant with out-pt Rx, has been dc'ed from out-patient Dialysis ctr  - Renal service following, hemodialysis in the hospital, she initially had 3 HD's back-to-back for the fluid overload.  Awaiting acceptance at [facility] Now being dialyzed Monday Wednesday Friday, last hemodialysis 7/7     ESRD (end stage renal disease): See #1  HTN (hypertension)  - BP stable, cont coreg and altace  Allergies/sinusitis  - Started on Flonase spray, feels better  Chronic combined systolic and diastolic CHF:  - Cont HD for volume overload, coreg, ACEI  DVT Prophylaxis:  Code Status:  Disposition: Unclear, pending Out-patient dialysis approval  Subjective:  Denies any specific complaints, feels better. Her appetite has improved after starting dialysis      Objective: Filed Vitals:   04/28/13 1810 04/28/13 1849 04/28/13 2218 04/29/13 0536  BP: 114/81 134/96 104/73 98/67  Pulse: 94 89 93 73  Temp: 98 F (36.7 C) 98.5 F (36.9 C) 99.4 F (37.4 C) 98.3 F (36.8 C)  TempSrc: Oral  Oral Oral  Resp:   18 18  Height:      Weight: 62.6 kg (138 lb 0.1 oz)  55.611 kg (122 lb 9.6 oz)   SpO2: 100% 95% 99% 100%    Intake/Output Summary (Last 24 hours) at 04/29/13 0844 Last data filed at 04/28/13 1857  Gross per 24 hour  Intake    120 ml  Output   3000 ml  Net  -2880 ml    Exam: General: Alert, cooperative, NAD  Heart: RRR  Lungs: CTA bilaterally, no wheezes, rales, rhonchi  Abdomen: soft, NT, non -distended, normal BS  Extremities: No LE edema  Dialysis Access: LUA AVF + bruit    Data Reviewed: Basic Metabolic Panel:  Recent Labs Lab 04/22/13 1825  04/23/13 0638 04/23/13 1541 04/24/13 1716 04/25/13 0647 04/28/13 0915  NA 134* 133*  --  137 137 136  K 5.8* 5.0  --  4.8 4.3 4.6  CL 93* 92*  --  98 97 95*  CO2 21 17*  --  26 27 30   GLUCOSE 94 92  --  88 81 121*  BUN 85* 84*  --  28* 32* 39*  CREATININE 18.17* 18.98*  --  9.34* 10.90* 12.74*  CALCIUM 9.7 9.2  --  9.5 9.3 9.5  PHOS  --   --  3.8 7.0*  --   --     Liver Function Tests:  Recent Labs Lab 04/22/13 1825 04/24/13 1716  AST 22  --   ALT 14  --   ALKPHOS 75  --   BILITOT 0.3  --   PROT 7.6  --   ALBUMIN 3.7 3.6   No results found for this basename: LIPASE, AMYLASE,  in the last 168 hours No results found for this basename: AMMONIA,  in the last 168 hours  CBC:  Recent Labs Lab 04/22/13 1825 04/23/13 0638 04/25/13 0647 04/28/13 1400  WBC 5.1 4.2 4.2 5.4  HGB 10.5* 9.3* 9.4* 9.4*  HCT 32.0* 28.3* 29.2* 28.8*  MCV 90.1 90.4 91.0 90.3  PLT 233 205 187 187    Cardiac Enzymes: No results found for this basename: CKTOTAL, CKMB, CKMBINDEX, TROPONINI,  in the last 168 hours  BNP (last 3 results)  Recent Labs  04/09/13 1005  PROBNP >70000.0*     CBG: No results found for this basename: GLUCAP,  in the last 168 hours  Recent Results (from the past 240 hour(s))  MRSA PCR SCREENING     Status: Abnormal   Collection Time    04/23/13  4:07 PM      Result Value Range Status   MRSA by PCR POSITIVE (*) NEGATIVE Final   Comment:            The GeneXpert MRSA Assay (FDA     approved for NASAL specimens     only), is one component of a     comprehensive MRSA colonization     surveillance program. It is not     intended to diagnose MRSA     infection nor to guide or     monitor treatment for     MRSA infections.     RESULT CALLED TO, READ BACK BY AND VERIFIED WITH:     RN C.MARSHALL AT 2114 04/23/13     Studies: Dg Chest 2 View  04/26/2013   *RADIOLOGY REPORT*  Clinical Data: Cough and congestion. Rule out pneumonia.  CHEST - 2 VIEW  Comparison:  04/23/2013  Findings: There is mild cardiac enlargement.  No pleural effusions or pulmonary edema.  No airspace consolidation identified.  The visualized osseous structures are unremarkable.  IMPRESSION:  1.  Cardiac enlargement. 2.  No evidence for pneumonia.   Original Report Authenticated By: Signa Kell, M.D.   Dg Chest 2 View  04/22/2013   *RADIOLOGY REPORT*  Clinical Data: Chest pain, history of CHF  CHEST - 2 VIEW  Comparison: Prior radiograph from 04/09/2013  Findings: Mild cardiac enlargement is grossly stable as compared to the prior exam.  The lungs are well inflated.  Tiny left pleural effusion is present, improved as compared to the prior exam.  No definite right pleural effusion is now seen.  Pulmonary vascular congestion is also improved.  There is no overt pulmonary edema.  No pneumothorax.  No acute osseous abnormality.  IVC filter is noted.  IMPRESSION: Interval improvement in pulmonary vascular congestion and bilateral pleural effusions, with small residual left pleural effusion.  No airspace consolidation.   Original Report Authenticated By: Rise Mu, M.D.   Dg Chest 2 View  04/09/2013   *RADIOLOGY REPORT*  Clinical Data: Headaches.  Shortness of breath.  CHEST - 2 VIEW  Comparison: PA and lateral chest 02/18/2013.  Findings: There is enlargement of the cardiopericardial silhouette. There are small bilateral pleural effusions.  Pulmonary vascular congestion without frank edema is noted.  No pneumothorax.  IMPRESSION:  1.  Enlarged cardiopericardial silhouette compatible with cardiomegaly and/or pericardial effusion. 2.  Small bilateral pleural effusions.   Original Report Authenticated By: Holley Dexter, M.D.   Ct Angio Chest Pe W/cm &/or Wo Cm  04/23/2013   *RADIOLOGY REPORT*  Clinical Data: Chest pain.  Short of breath.  Pulmonary embolism. End-stage renal disease.  CT ANGIOGRAPHY CHEST  Technique:  Multidetector CT imaging of the chest using the standard protocol during  bolus administration of intravenous contrast. Multiplanar reconstructed images including MIPs were obtained and reviewed to evaluate the vascular anatomy.  Contrast: OMNIPAQUE IOHEXOL 350 MG/ML SOLN  Comparison: 11/12/2012.  Findings: Technically adequate study.  Negative for pulmonary embolus.  No acute aortic abnormality.  No axillary adenopathy. Cardiomegaly is present.  Incidental visualization of the upper abdomen is within normal limits.  No pericardial  effusion.  No pleural effusion.  Lungs demonstrate dependent atelectasis.  There is no airspace disease. Central airways are patent.  IMPRESSION:  1.  Technically adequate study without pulmonary embolus or acute abnormality. 2.  Cardiomegaly. 3.  Trace left pleural effusion with dependent atelectasis.   Original Report Authenticated By: Andreas Newport, M.D.    Scheduled Meds: . amLODipine  5 mg Oral Daily  . calcium acetate  1,334 mg Oral TID WC  . carvedilol  6.25 mg Oral BID WC  . doxercalciferol  8 mcg Intravenous Q M,W,F-HD  . enoxaparin (LOVENOX) injection  30 mg Subcutaneous Q24H  . famotidine  20 mg Oral Daily  . fluticasone  2 spray Each Nare Daily  . multivitamin  1 tablet Oral q morning - 10a  . ramipril  5 mg Oral BID  . sodium chloride  3 mL Intravenous Q12H  . sodium chloride  3 mL Intravenous Q12H  . tuberculin  5 Units Intradermal Once   Continuous Infusions:   Principal Problem:   Fluid overload Active Problems:   ESRD (end stage renal disease)   HTN (hypertension)    Time spent: 40 minutes   [facility]  [facility] Pager [phone]. If 8PM-8AM, please contact night-coverage at [email or web address], password [email or web address] 04/29/2013, 8:44 AM  LOS: 7 days

## 2013-04-29 NOTE — Progress Notes (Signed)
Subjective:  No cos Objective Vital signs in last 24 hours: Filed Vitals:   04/28/13 1849 04/28/13 2218 04/29/13 0536 04/29/13 0912  BP: 134/96 104/73 98/67 107/72  Pulse: 89 93 73 87  Temp: 98.5 F (36.9 C) 99.4 F (37.4 C) 98.3 F (36.8 C) 98.7 F (37.1 C)  TempSrc:  Oral Oral   Resp:  18 18 18   Height:      Weight:  55.611 kg (122 lb 9.6 oz)    SpO2: 95% 99% 100% 98%   Weight change: 2.3 kg (5 lb 1.1 oz)  Intake/Output Summary (Last 24 hours) at 04/29/13 1137 Last data filed at 04/29/13 0912  Gross per 24 hour  Intake    100 ml  Output   3000 ml  Net  -2900 ml   Labs: Basic Metabolic Panel:  Recent Labs Lab 04/23/13 1541 04/24/13 1716 04/25/13 0647 04/28/13 0915 04/29/13 0950  NA  --  137 137 136  --   K  --  4.8 4.3 4.6  --   CL  --  98 97 95*  --   CO2  --  26 27 30   --   GLUCOSE  --  88 81 121*  --   BUN  --  28* 32* 39*  --   CREATININE  --  9.34* 10.90* 12.74* 8.20*  CALCIUM  --  9.5 9.3 9.5  --   PHOS 3.8 7.0*  --   --   --    Liver Function Tests:  Recent Labs Lab 04/22/13 1825 04/24/13 1716  AST 22  --   ALT 14  --   ALKPHOS 75  --   BILITOT 0.3  --   PROT 7.6  --   ALBUMIN 3.7 3.6   CBC:  Recent Labs Lab 04/22/13 1825 04/23/13 0638 04/25/13 0647 04/28/13 1400  WBC 5.1 4.2 4.2 5.4  HGB 10.5* 9.3* 9.4* 9.4*  HCT 32.0* 28.3* 29.2* 28.8*  MCV 90.1 90.4 91.0 90.3  PLT 233 205 187 187    Physical Exam:  General: Alert NAd young BF  Heart: RRR, no rub or mur.  Lungs: CTA bilat.  Abdomen: BS Pos. Soft nontender  Extremities: Dialysis Access: No pedal edema/ Pos. Bruit LUA AVF   Outpatient HD (was AF TTS until April 21, 2013)  3hrs 69.5kg EDW 2.25ca Heparin none LUA AVF  Zemplar Epogen 3600   Assess/Plan  1 SOB- due to missed HD, vol excess; resolved  2 Hyperkalemia- resolved with hd  3 CKD, discharged from [facility] on 6/30 / next hd in am MWF ( schedule in hosp.)  4 Hypertension- cont with meds and hd with  Wt  down  From admit/ multiple drinks containers and water pitcher in room , reminded her of diet  5 Anemia - hgb 9.4 Give aranesp q weekly hd  6 Metabolic bone disease -On Vit d and binders  Lenny Pastel, PA-C [facility] Beeper [phone] 04/29/2013,11:37 AM  LOS: 7 days   Unfortunate situation.  I wonder if psychiatry consultation will be of benefit. Ashdon Gillson C

## 2013-04-29 NOTE — Progress Notes (Signed)
Follow-up visit with pt. Pt appeared alert and oriented, and was sitting up on her bed when I entered the room. Pt agreed to sign informed consent for counseling services. Pt was given an option of expressive art activities to engage in during this session. Pt did an expressive art activity and discussed her stressors including her family, and her hopes for the future. Pt discussed her experience of grief and loss after she fell sick, and the emotions that she experienced during the loss. Pt discussed her understanding of her medical condition and its implications on her functioning on various aspects in her life. Pt discussed issues including compliance to her plan of care and her concerns. Pt and I agree on a follow-up session tomorrow afternoon.  Sherol Dade Counselor Intern Haroldine Laws

## 2013-04-30 MED ORDER — DIPHENHYDRAMINE HCL 25 MG PO CAPS
ORAL_CAPSULE | ORAL | Status: AC
  Administered 2013-04-30: 25 mg
  Filled 2013-04-30: qty 1

## 2013-04-30 MED ORDER — DOXERCALCIFEROL 4 MCG/2ML IV SOLN
INTRAVENOUS | Status: AC
  Administered 2013-04-30: 8 ug via INTRAVENOUS
  Filled 2013-04-30: qty 4

## 2013-04-30 MED ORDER — RENA-VITE PO TABS
1.0000 | ORAL_TABLET | Freq: Every day | ORAL | Status: DC
  Administered 2013-04-30: 1 via ORAL
  Administered 2013-05-01: 11:00:00 via ORAL
  Filled 2013-04-30 (×3): qty 1

## 2013-04-30 NOTE — Progress Notes (Signed)
[facility] PROGRESS NOTE  [name] Small ZOX:[id number] DOB: 04-05-1991 DOA: 04/22/2013 PCP: Ellin Saba  Assessment/Plan: Principal Problem:   Fluid overload Active Problems:   ESRD (end stage renal disease)   HTN (hypertension)      Fluid overload with history of end-stage renal disease on hemodialysis, severely noncompliant with out-pt Rx, has been dc'ed from out-patient Dialysis ctr  - Renal service following, hemodialysis in the hospital, she initially had 3 HD's back-to-back for the fluid overload.  Awaiting acceptance at [facility]  Now being dialyzed Monday Wednesday Friday, hemodialysis today ESRD (end stage renal disease): See #1  Waiting to be clipped for hemodialysis outpatient  HTN (hypertension)  - BP stable, cont coreg and altace   Allergies/sinusitis  - Started on Flonase spray, feels better  Chronic combined systolic and diastolic CHF:  - Cont HD for volume overload, coreg, ACEI    DVT Prophylaxis:  Code Status: Full Disposition: Unclear, pending Out-patient dialysis approval  Subjective:  Denies any specific complaints, feels better.  Counseled about fluid intake     Objective: Filed Vitals:   04/29/13 1310 04/29/13 1631 04/29/13 2114 04/30/13 0456  BP: 118/71 120/61 118/91 122/77  Pulse: 77 84 87 86  Temp: 98.8 F (37.1 C) 98.8 F (37.1 C) 99 F (37.2 C) 98.3 F (36.8 C)  TempSrc:   Oral Oral  Resp: 18 17 18 18   Height:   5\' 3"  (1.6 m)   Weight:   63.549 kg (140 lb 1.6 oz)   SpO2: 100% 94% 95% 100%    Intake/Output Summary (Last 24 hours) at 04/30/13 0848 Last data filed at 04/30/13 0200  Gross per 24 hour  Intake    940 ml  Output      0 ml  Net    940 ml    Exam:  HENT:  Head: Atraumatic.  Nose: Nose normal.  Mouth/Throat: Oropharynx is clear and moist.  Eyes: Conjunctivae are normal. Pupils are equal, round, and reactive to light. No scleral icterus.  Neck: Neck supple. No tracheal deviation present.   Cardiovascular: Normal rate, regular rhythm, normal heart sounds and intact distal pulses.  Pulmonary/Chest: Effort normal and breath sounds normal. No respiratory distress.  Abdominal: Soft. Normal appearance and bowel sounds are normal. She exhibits no distension. There is no tenderness.  Musculoskeletal: She exhibits no edema and no tenderness.  Neurological: She is alert. No cranial nerve deficit.    Data Reviewed: Basic Metabolic Panel:  Recent Labs Lab 04/23/13 1541 04/24/13 1716 04/25/13 0647 04/28/13 0915 04/29/13 0950  NA  --  137 137 136  --   K  --  4.8 4.3 4.6  --   CL  --  98 97 95*  --   CO2  --  26 27 30   --   GLUCOSE  --  88 81 121*  --   BUN  --  28* 32* 39*  --   CREATININE  --  9.34* 10.90* 12.74* 8.20*  CALCIUM  --  9.5 9.3 9.5  --   PHOS 3.8 7.0*  --   --   --     Liver Function Tests:  Recent Labs Lab 04/24/13 1716  ALBUMIN 3.6   No results found for this basename: LIPASE, AMYLASE,  in the last 168 hours No results found for this basename: AMMONIA,  in the last 168 hours  CBC:  Recent Labs Lab 04/25/13 0647 04/28/13 1400  WBC 4.2 5.4  HGB 9.4* 9.4*  HCT 29.2*  28.8*  MCV 91.0 90.3  PLT 187 187    Cardiac Enzymes: No results found for this basename: CKTOTAL, CKMB, CKMBINDEX, TROPONINI,  in the last 168 hours BNP (last 3 results)  Recent Labs  04/09/13 1005  PROBNP >70000.0*     CBG: No results found for this basename: GLUCAP,  in the last 168 hours  Recent Results (from the past 240 hour(s))  MRSA PCR SCREENING     Status: Abnormal   Collection Time    04/23/13  4:07 PM      Result Value Range Status   MRSA by PCR POSITIVE (*) NEGATIVE Final   Comment:            The GeneXpert MRSA Assay (FDA     approved for NASAL specimens     only), is one component of a     comprehensive MRSA colonization     surveillance program. It is not     intended to diagnose MRSA     infection nor to guide or     monitor treatment for      MRSA infections.     RESULT CALLED TO, READ BACK BY AND VERIFIED WITH:     RN C.MARSHALL AT 2114 04/23/13     Studies: Dg Chest 2 View  04/26/2013   *RADIOLOGY REPORT*  Clinical Data: Cough and congestion. Rule out pneumonia.  CHEST - 2 VIEW  Comparison: 04/23/2013  Findings: There is mild cardiac enlargement.  No pleural effusions or pulmonary edema.  No airspace consolidation identified.  The visualized osseous structures are unremarkable.  IMPRESSION:  1.  Cardiac enlargement. 2.  No evidence for pneumonia.   Original Report Authenticated By: Signa Kell, M.D.   Dg Chest 2 View  04/22/2013   *RADIOLOGY REPORT*  Clinical Data: Chest pain, history of CHF  CHEST - 2 VIEW  Comparison: Prior radiograph from 04/09/2013  Findings: Mild cardiac enlargement is grossly stable as compared to the prior exam.  The lungs are well inflated.  Tiny left pleural effusion is present, improved as compared to the prior exam.  No definite right pleural effusion is now seen.  Pulmonary vascular congestion is also improved.  There is no overt pulmonary edema.  No pneumothorax.  No acute osseous abnormality.  IVC filter is noted.  IMPRESSION: Interval improvement in pulmonary vascular congestion and bilateral pleural effusions, with small residual left pleural effusion.  No airspace consolidation.   Original Report Authenticated By: Rise Mu, M.D.   Dg Chest 2 View  04/09/2013   *RADIOLOGY REPORT*  Clinical Data: Headaches.  Shortness of breath.  CHEST - 2 VIEW  Comparison: PA and lateral chest 02/18/2013.  Findings: There is enlargement of the cardiopericardial silhouette. There are small bilateral pleural effusions.  Pulmonary vascular congestion without frank edema is noted.  No pneumothorax.  IMPRESSION:  1.  Enlarged cardiopericardial silhouette compatible with cardiomegaly and/or pericardial effusion. 2.  Small bilateral pleural effusions.   Original Report Authenticated By: Holley Dexter, M.D.   Ct  Angio Chest Pe W/cm &/or Wo Cm  04/23/2013   *RADIOLOGY REPORT*  Clinical Data: Chest pain.  Short of breath.  Pulmonary embolism. End-stage renal disease.  CT ANGIOGRAPHY CHEST  Technique:  Multidetector CT imaging of the chest using the standard protocol during bolus administration of intravenous contrast. Multiplanar reconstructed images including MIPs were obtained and reviewed to evaluate the vascular anatomy.  Contrast: OMNIPAQUE IOHEXOL 350 MG/ML SOLN  Comparison: 11/12/2012.  Findings: Technically adequate study.  Negative for pulmonary embolus.  No acute aortic abnormality.  No axillary adenopathy. Cardiomegaly is present.  Incidental visualization of the upper abdomen is within normal limits.  No pericardial effusion.  No pleural effusion.  Lungs demonstrate dependent atelectasis.  There is no airspace disease. Central airways are patent.  IMPRESSION:  1.  Technically adequate study without pulmonary embolus or acute abnormality. 2.  Cardiomegaly. 3.  Trace left pleural effusion with dependent atelectasis.   Original Report Authenticated By: Andreas Newport, M.D.    Scheduled Meds: . amLODipine  5 mg Oral Daily  . calcium acetate  1,334 mg Oral TID WC  . carvedilol  6.25 mg Oral BID WC  . doxercalciferol  8 mcg Intravenous Q M,W,F-HD  . enoxaparin (LOVENOX) injection  30 mg Subcutaneous Q24H  . famotidine  20 mg Oral Daily  . fluticasone  2 spray Each Nare Daily  . multivitamin  1 tablet Oral q morning - 10a  . ramipril  5 mg Oral BID  . sodium chloride  3 mL Intravenous Q12H  . sodium chloride  3 mL Intravenous Q12H  . tuberculin  5 Units Intradermal Once   Continuous Infusions:   Principal Problem:   Fluid overload Active Problems:   ESRD (end stage renal disease)   HTN (hypertension)    Time spent: 40 minutes   [facility]  [facility] Pager [phone]. If 8PM-8AM, please contact night-coverage at [email or web address], password [facility] 04/30/2013, 8:48 AM  LOS: 8 days

## 2013-04-30 NOTE — Progress Notes (Signed)
Hemodialysis Tx today. Not able to meet goal due to dropping her BP. Has bee CLIPPED to an OP center and will start OP Tx on Saturday.

## 2013-04-30 NOTE — Progress Notes (Signed)
04/30/2013 2:21 PM Hemodialysis Outpatient Note; this patient has been accepted at the [facility]ialysis center located at [address] [facility]. Patient schedule will be Tues/Thurs/Sat 11:30 with her planned first day being Saturday July 12th at 10:30 AM.The center is requiring Hepatitis Labs and a negative PPD all to have resulted within 30 days prior to admission. We have drawn the necessary labs today at dialysis and will fax the results to the [place] unit when they come back. Charge nurse today on 6E, is obtaining the most recent PPD. Thank You. Tilman Neat

## 2013-04-30 NOTE — Progress Notes (Signed)
Subjective:  No cos, mother in room. For hd today,awaitng op [place], [place] center acceptence Objective Vital signs in last 24 hours: Filed Vitals:   04/29/13 1310 04/29/13 1631 04/29/13 2114 04/30/13 0456  BP: 118/71 120/61 118/91 122/77  Pulse: 77 84 87 86  Temp: 98.8 F (37.1 C) 98.8 F (37.1 C) 99 F (37.2 C) 98.3 F (36.8 C)  TempSrc:   Oral Oral  Resp: 18 17 18 18   Height:   5\' 3"  (1.6 m)   Weight:   63.549 kg (140 lb 1.6 oz)   SpO2: 100% 94% 95% 100%   Weight change: -2.151 kg (-4 lb 11.9 oz)  Intake/Output Summary (Last 24 hours) at 04/30/13 0930 Last data filed at 04/30/13 0200  Gross per 24 hour  Intake    840 ml  Output      0 ml  Net    840 ml   Labs: Basic Metabolic Panel:  Recent Labs Lab 04/23/13 1541  04/24/13 1716 04/25/13 0647 04/28/13 0915 04/29/13 0950  NA  --   --  137 137 136  --   K  --   --  4.8 4.3 4.6  --   CL  --   --  98 97 95*  --   CO2  --   --  26 27 30   --   GLUCOSE  --   --  88 81 121*  --   BUN  --   --  28* 32* 39*  --   CREATININE  --   < > 9.34* 10.90* 12.74* 8.20*  CALCIUM  --   --  9.5 9.3 9.5  --   PHOS 3.8  --  7.0*  --   --   --   < > = values in this interval not displayed. Liver Function Tests:  Recent Labs Lab 04/24/13 1716  ALBUMIN 3.6   No results found for this basename: LIPASE, AMYLASE,  in the last 168 hours No results found for this basename: AMMONIA,  in the last 168 hours CBC:  Recent Labs Lab 04/25/13 0647 04/28/13 1400  WBC 4.2 5.4  HGB 9.4* 9.4*  HCT 29.2* 28.8*  MCV 91.0 90.3  PLT 187 187    Physical Exam:  General: Alert/ NAD young BF  Heart: RRR, no rub or mur.  Lungs: CTA bilat.  Abdomen: BS Pos. Soft nontender  Extremities: Dialysis Access: No pedal edema/ Pos. Bruit LUA AVF   Outpatient HD (was AF TTS until April 21, 2013)  3hrs 69.5kg EDW 2.25ca Heparin none LUA AVF  [facility] Epogen 3600   Assess/Plan  1 SOB- due to missed HD, vol excess; resolved with hD 2  Hyperkalemia- resolved with hd  3 ESRD  discharged from [facility] on 6/30 / next hd today/MWF ( schedule in hosp.)  4 Hypertension- cont with meds and hd with Wt down From admit/  5 Anemia - hgb 9.4 Give aranesp q weekly hd  6 Metabolic bone disease -On Vit d and binders   Lenny Pastel, PA-C [facility] Beeper [phone] 04/30/2013,9:30 AM  LOS: 8 days   I spoke with patient and mother regarding outpt options.  Also d/w them possible need for mental health evaluation to help her deal with the HD and anxiety. Jaclyne Haverstick C

## 2013-04-30 NOTE — Care Management Note (Signed)
   CARE MANAGEMENT NOTE 04/30/2013  Patient:  [name] Small,[name] Small   Account Number:  [id number]  Date Initiated:  04/30/2013  Documentation initiated by:  Johny Shock  Subjective/Objective Assessment:   [facility] from [facility] will need ongoing hemodialysis.     Action/Plan:   Multiple attempts to setup pt for outpatient hemodialysis. Family request placement in [place], pt in agreement. Will move there to be with family.   Anticipated DC Date:  05/01/2013   Anticipated DC Plan:  HOME/SELF CARE         Choice offered to / List presented to:             Status of service:  Completed, signed off Medicare Important Message given?   (If response is "NO", the following Medicare IM given date fields will be blank) Date Medicare IM given:   Date Additional Medicare IM given:    Discharge Disposition:  HOME/SELF CARE  Per UR Regulation:    If discussed at Long Length of Stay Meetings, dates discussed:    Comments:  04/30/2013 Pt will d/c to home and move to [place] with family, has been accepted at outpatient hemodialysis center there. Wait hepatic labs and will fax info , hopeful for d/c in next 24-48 hr.

## 2013-04-30 NOTE — Progress Notes (Signed)
Spoke with [facility]. PPD placed on 04/17/2013 and read on 04/19/2013. Result was negative. [place] to fax results and will place on chart.

## 2013-05-01 LAB — CBC
HCT: 33.6 % — ABNORMAL LOW (ref 36.0–46.0)
Hemoglobin: 10.7 g/dL — ABNORMAL LOW (ref 12.0–15.0)
MCV: 93.1 fL (ref 78.0–100.0)
RDW: 15.2 % (ref 11.5–15.5)
WBC: 7 10*3/uL (ref 4.0–10.5)

## 2013-05-01 LAB — RENAL FUNCTION PANEL
BUN: 20 mg/dL (ref 6–23)
Chloride: 96 mEq/L (ref 96–112)
Creatinine, Ser: 6.74 mg/dL — ABNORMAL HIGH (ref 0.50–1.10)
Glucose, Bld: 84 mg/dL (ref 70–99)
Potassium: 4.3 mEq/L (ref 3.5–5.1)

## 2013-05-01 LAB — HEPATITIS B CORE ANTIBODY, TOTAL: Hep B Core Total Ab: POSITIVE — AB

## 2013-05-01 MED ORDER — FLUTICASONE PROPIONATE 50 MCG/ACT NA SUSP: 2.0000 | Freq: Every day | NASAL | Status: DC

## 2013-05-01 MED ORDER — CARVEDILOL 6.25 MG PO TABS: 6.2500 mg | ORAL_TABLET | Freq: Two times a day (BID) | ORAL | Status: DC

## 2013-05-01 MED ORDER — CALCIUM ACETATE 667 MG PO CAPS: 1334.0000 mg | ORAL_CAPSULE | Freq: Three times a day (TID) | ORAL | Status: DC

## 2013-05-01 MED ORDER — OXYCODONE HCL 5 MG PO TABS: 5.0000 mg | ORAL_TABLET | Freq: Three times a day (TID) | ORAL | Status: DC | PRN

## 2013-05-01 NOTE — Discharge Summary (Signed)
Physician Discharge Summary  [name] Small MRN: [id number] DOB/AGE: 22/05/1991 22 y.o.  PCP: Ellin Saba   Admit date: 04/22/2013 Discharge date: 05/01/2013  Discharge Diagnoses:      Fluid overload Active Problems:   ESRD (end stage renal disease)   HTN (hypertension)     Medication List    STOP taking these medications       sevelamer carbonate 800 MG tablet  Commonly known as:  RENVELA      TAKE these medications       amLODipine 5 MG tablet  Commonly known as:  NORVASC  Take 5 mg by mouth daily.     calcium acetate 667 MG capsule  Commonly known as:  PHOSLO  Take 2 capsules (1,334 mg total) by mouth 3 (three) times daily with meals. Take 667mg  with snacks     carvedilol 6.25 MG tablet  Commonly known as:  COREG  Take 1 tablet (6.25 mg total) by mouth 2 (two) times daily with a meal.     famotidine 20 MG tablet  Commonly known as:  PEPCID  Take 20 mg by mouth daily as needed. For heartburn     fluticasone 50 MCG/ACT nasal spray  Commonly known as:  FLONASE  Place 2 sprays into the nose daily.     multivitamin Tabs tablet  Take 1 tablet by mouth every morning.     ondansetron 4 MG disintegrating tablet  Commonly known as:  ZOFRAN ODT  Take 1 tablet (4 mg total) by mouth every 8 (eight) hours as needed for nausea.     oxyCODONE 5 MG immediate release tablet  Commonly known as:  Oxy IR/ROXICODONE  Take 1 tablet (5 mg total) by mouth every 8 (eight) hours as needed.     ramipril 5 MG tablet  Commonly known as:  ALTACE  Take 1 tablet (5 mg total) by mouth 2 (two) times daily.        Discharge Condition: *  Disposition: 01-Home or Self Care   Consults:   nephrology  Significant Diagnostic Studies: Dg Chest 2 View  04/26/2013   *RADIOLOGY REPORT*  Clinical Data: Cough and congestion. Rule out pneumonia.  CHEST - 2 VIEW  Comparison: 04/23/2013  Findings: There is mild cardiac enlargement.  No pleural effusions or pulmonary edema.  No  airspace consolidation identified.  The visualized osseous structures are unremarkable.  IMPRESSION:  1.  Cardiac enlargement. 2.  No evidence for pneumonia.   Original Report Authenticated By: Signa Kell, M.D.   Dg Chest 2 View  04/22/2013   *RADIOLOGY REPORT*  Clinical Data: Chest pain, history of CHF  CHEST - 2 VIEW  Comparison: Prior radiograph from 04/09/2013  Findings: Mild cardiac enlargement is grossly stable as compared to the prior exam.  The lungs are well inflated.  Tiny left pleural effusion is present, improved as compared to the prior exam.  No definite right pleural effusion is now seen.  Pulmonary vascular congestion is also improved.  There is no overt pulmonary edema.  No pneumothorax.  No acute osseous abnormality.  IVC filter is noted.  IMPRESSION: Interval improvement in pulmonary vascular congestion and bilateral pleural effusions, with small residual left pleural effusion.  No airspace consolidation.   Original Report Authenticated By: Rise Mu, M.D.   Dg Chest 2 View  04/09/2013   *RADIOLOGY REPORT*  Clinical Data: Headaches.  Shortness of breath.  CHEST - 2 VIEW  Comparison: PA and lateral chest 02/18/2013.  Findings: There is  enlargement of the cardiopericardial silhouette. There are small bilateral pleural effusions.  Pulmonary vascular congestion without frank edema is noted.  No pneumothorax.  IMPRESSION:  1.  Enlarged cardiopericardial silhouette compatible with cardiomegaly and/or pericardial effusion. 2.  Small bilateral pleural effusions.   Original Report Authenticated By: Holley Dexter, M.D.   Ct Angio Chest Pe W/cm &/or Wo Cm  04/23/2013   *RADIOLOGY REPORT*  Clinical Data: Chest pain.  Short of breath.  Pulmonary embolism. End-stage renal disease.  CT ANGIOGRAPHY CHEST  Technique:  Multidetector CT imaging of the chest using the standard protocol during bolus administration of intravenous contrast. Multiplanar reconstructed images including MIPs were  obtained and reviewed to evaluate the vascular anatomy.  Contrast: OMNIPAQUE IOHEXOL 350 MG/ML SOLN  Comparison: 11/12/2012.  Findings: Technically adequate study.  Negative for pulmonary embolus.  No acute aortic abnormality.  No axillary adenopathy. Cardiomegaly is present.  Incidental visualization of the upper abdomen is within normal limits.  No pericardial effusion.  No pleural effusion.  Lungs demonstrate dependent atelectasis.  There is no airspace disease. Central airways are patent.  IMPRESSION:  1.  Technically adequate study without pulmonary embolus or acute abnormality. 2.  Cardiomegaly. 3.  Trace left pleural effusion with dependent atelectasis.   Original Report Authenticated By: Andreas Newport, M.D.      Microbiology: Recent Results (from the past 240 hour(s))  MRSA PCR SCREENING     Status: Abnormal   Collection Time    04/23/13  4:07 PM      Result Value Range Status   MRSA by PCR POSITIVE (*) NEGATIVE Final   Comment:            The GeneXpert MRSA Assay (FDA     approved for NASAL specimens     only), is one component of a     comprehensive MRSA colonization     surveillance program. It is not     intended to diagnose MRSA     infection nor to guide or     monitor treatment for     MRSA infections.     RESULT CALLED TO, READ BACK BY AND VERIFIED WITH:     RN C.MARSHALL AT 2114 04/23/13     Labs: Results for orders placed during the hospital encounter of 04/22/13 (from the past 48 hour(s))  HEPATITIS B SURFACE ANTIGEN     Status: None   Collection Time    04/30/13  2:21 PM      Result Value Range   Hepatitis B Surface Ag NEGATIVE  NEGATIVE  CBC     Status: Abnormal   Collection Time    05/01/13  7:07 AM      Result Value Range   WBC 7.0  4.0 - 10.5 K/uL   RBC 3.61 (*) 3.87 - 5.11 MIL/uL   Hemoglobin 10.7 (*) 12.0 - 15.0 g/dL   HCT 16.1 (*) 09.6 - 04.5 %   MCV 93.1  78.0 - 100.0 fL   MCH 29.6  26.0 - 34.0 pg   MCHC 31.8  30.0 - 36.0 g/dL   RDW 40.9   81.1 - 91.4 %   Platelets 209  150 - 400 K/uL  RENAL FUNCTION PANEL     Status: Abnormal   Collection Time    05/01/13  7:07 AM      Result Value Range   Sodium 136  135 - 145 mEq/L   Potassium 4.3  3.5 - 5.1 mEq/L   Chloride 96  96 - 112 mEq/L   CO2 28  19 - 32 mEq/L   Glucose, Bld 84  70 - 99 mg/dL   BUN 20  6 - 23 mg/dL   Creatinine, Ser 1.61 (*) 0.50 - 1.10 mg/dL   Calcium 09.6 (*) 8.4 - 10.5 mg/dL   Phosphorus 4.7 (*) 2.3 - 4.6 mg/dL   Albumin 3.6  3.5 - 5.2 g/dL   GFR calc non Af Amer 8 (*) >90 mL/min   GFR calc Af Amer 9 (*) >90 mL/min   Comment:            The eGFR has been calculated     using the CKD EPI equation.     This calculation has not been     validated in all clinical     situations.     eGFR's persistently     <90 mL/min signify     possible Chronic Kidney Disease.     HPI :*Latara Micheli is a 22 y.o. female admitted with SOB progressive over past few days and Pleuritic Type Chest pain having missed HD since 04/11/13 her last tx and then she only had 2 hrs of her 3hr tx time.In the ER noted troponins were negative and with K 5.8 .She has a History of severe noncompliance with her Outpatient HD and was discharged from [facility] with A 30 day notice on May 29,2014. She tells me "" [facility] is evaluating her for MWF schedule and she went to [facility] ER this past Monday and discharged because did not need hemodialysis then."" Now on HD without chest pain and no sob,"just itching".  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  Tolerating hemodialysis treatment without hemodynamic issues. To be discharged to [facility].  Dr Lowell Guitar  told her if she is compliant with therapies for 6  months we would consider reevaluation for acceptance in a [facility] facility. ; this patient has been accepted at the [facility] located at [address] [facility]. Patient schedule will be Tues/Thurs/Sat 11:30 with her planned first day being Saturday July 12th at 10:30 AM.The center is requiring Hepatitis Labs and a negative PPD all to have resulted within 30 days prior to admission. We have drawn the necessary labs today at dialysis and will fax the results to the [facility] unit  PPD placed    HTN (hypertension)  - BP stable, cont coreg and altace    Allergies/sinusitis  - Started on Flonase spray, feels better   Chronic combined systolic and diastolic CHF:  - Cont HD for volume overload, coreg, ACEI    Discharge Exam:  Blood pressure 113/69, pulse 98, temperature 98.9 F (37.2 C), temperature source Oral, resp. rate 21, height 5\' 3"  (1.6 m), weight 61.5 kg (135 lb 9.3 oz), SpO2 100.00%.  General: Alert/ NAD young BF  Heart: RRR, no rub or mur.  Lungs: CTA bilat.  Abdomen: BS Pos. Soft nontender  Extremities: Dialysis Access: No pedal edema/ Pos. Bruit LUA AVF         Signed: Rosalinda Seaman 05/01/2013, 10:51 AM

## 2013-05-01 NOTE — Procedures (Signed)
Tolerating hemodialysis treatment without hemodynamic issues.  To be discharged to [facility].  I told her if she is compliant with therapies for 6 months we would consider reevaluation for acceptance in a [facility] Dialysis facility. [name] Small

## 2014-08-29 ENCOUNTER — Emergency Department (HOSPITAL_BASED_OUTPATIENT_CLINIC_OR_DEPARTMENT_OTHER)
Admission: EM | Admit: 2014-08-29 | Discharge: 2014-08-30 | Disposition: A | Discharge status: Home / Self Care | Payer: Medicare Other | Attending: Emergency Medicine | Admitting: Emergency Medicine

## 2014-08-29 ENCOUNTER — Encounter (HOSPITAL_BASED_OUTPATIENT_CLINIC_OR_DEPARTMENT_OTHER): Payer: Self-pay | Admitting: Emergency Medicine

## 2014-08-29 DIAGNOSIS — R079 Chest pain, unspecified: Secondary | ICD-10-CM

## 2014-08-29 DIAGNOSIS — Q613 Polycystic kidney, unspecified: Secondary | ICD-10-CM | POA: Diagnosis not present

## 2014-08-29 DIAGNOSIS — I120 Hypertensive chronic kidney disease with stage 5 chronic kidney disease or end stage renal disease: Secondary | ICD-10-CM | POA: Insufficient documentation

## 2014-08-29 DIAGNOSIS — Z862 Personal history of diseases of the blood and blood-forming organs and certain disorders involving the immune mechanism: Secondary | ICD-10-CM | POA: Insufficient documentation

## 2014-08-29 DIAGNOSIS — Z992 Dependence on renal dialysis: Secondary | ICD-10-CM | POA: Insufficient documentation

## 2014-08-29 DIAGNOSIS — I509 Heart failure, unspecified: Secondary | ICD-10-CM | POA: Diagnosis not present

## 2014-08-29 DIAGNOSIS — Z87891 Personal history of nicotine dependence: Secondary | ICD-10-CM | POA: Insufficient documentation

## 2014-08-29 DIAGNOSIS — N186 End stage renal disease: Secondary | ICD-10-CM | POA: Diagnosis not present

## 2014-08-29 DIAGNOSIS — Z86711 Personal history of pulmonary embolism: Secondary | ICD-10-CM | POA: Diagnosis not present

## 2014-08-29 DIAGNOSIS — Z7951 Long term (current) use of inhaled steroids: Secondary | ICD-10-CM | POA: Insufficient documentation

## 2014-08-29 DIAGNOSIS — Z79899 Other long term (current) drug therapy: Secondary | ICD-10-CM | POA: Diagnosis not present

## 2014-08-29 DIAGNOSIS — Z872 Personal history of diseases of the skin and subcutaneous tissue: Secondary | ICD-10-CM | POA: Diagnosis not present

## 2014-08-29 NOTE — ED Provider Notes (Signed)
CSN: [id number]     Arrival date & time 08/29/14  2343 History  This chart was scribed for Hanley SeamenJohn L Falicity Sheets, MD by Modena JanskyAlbert Thayil, ED Scribe. This patient was seen in room MH09/MH09 and the patient's care was started at 11:54 PM.   Chief Complaint  Patient presents with  . Chest Pain   The history is provided by the patient. No language interpreter was used.   HPI Comments: [name] Small is a 23 y.o. female with end-stage renal disease on hemodialysis and a history of pulmonary emboli. She presents to the Emergency Department complaining of constant upper substernal chest pain that started about 12 hours ago. She states that the pain woke her up from sleep just PTA along with some transient SOB. She describes the pain as sharp in nature and nonradiating. It does not change with movement or breathing. She currently rates the severity of the pain as a 8/10. She states that the pain is exacerbated by lying down and relieved by sitting up. She reports that her last dialysis was two days ago and her next one is tomorrow at 3 PM. She reports no SOB or diaphoresis at the present time. She denies any nausea, vomiting, or diarrhea. She is out of pain medication at home.   Past Medical History  Diagnosis Date  . Hemodialysis patient   . Hypertension   . Pulmonary emboli 01/2012    Bilateral, moderate clot burden, areas of pulmonary infarction and central necrosis  . CHF (congestive heart failure)   . Cardiomyopathy   . Dysrhythmia     at times per pt.  . Anemia   . H/O transfusion of packed red blood cells   . 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 Polycystic kidney disease     [facility]  T,Th, S  . Cellulitis and abscess of face 03/22/2013   Past Surgical History  Procedure Laterality Date  . Nephrectomy    . Av fistula placement    . Kidney transplant  2008    failed  .  Tonsillectomy      as a child.  . Adenoidectomy    . Incision and drainage abscess Right 03/21/2013    Procedure: INCISION AND DRAINAGE RIGHT CHEEK ABSCESS REMOVAL OF FOREIGN BODY;  Surgeon: Serena ColonelJefry Rosen, MD;  Location: [facility] OR;  Service: ENT;  Laterality: Right;   Family History  Problem Relation Age of Onset  . Polycystic kidney disease Father    History  Substance Use Topics  . Smoking status: Former Smoker -- 0.20 packs/day    Types: Cigarettes    Quit date: 05/06/2012  . Smokeless tobacco: Never Used  . Alcohol Use: No   OB History    No data available     Review of Systems  Constitutional: Negative for diaphoresis.  Respiratory: Negative for shortness of breath.   Cardiovascular: Positive for chest pain.  Gastrointestinal: Negative for nausea, vomiting and diarrhea.  All other systems reviewed and are negative.   Allergies  Tramadol; Morphine and related; and Vicodin  Home Medications   Prior to Admission medications   Medication Sig Start Date End Date Taking? Authorizing Provider  calcium acetate (PHOSLO) 667 MG capsule Take 2 capsules (1,334 mg total) by mouth 3 (three) times daily with meals. Take 667mg  with snacks 05/01/13  Yes Richarda OverlieNayana Abrol, MD  amLODipine (NORVASC) 5 MG tablet Take 5 mg by  mouth daily.    Historical Provider, MD  famotidine (PEPCID) 20 MG tablet Take 20 mg by mouth daily as needed. For heartburn 03/24/13   Elease Etienne, MD  fluticasone (FLONASE) 50 MCG/ACT nasal spray Place 2 sprays into the nose daily. 05/01/13   [name], MD  multivitamin (RENA-VIT) TABS tablet Take 1 tablet by mouth every morning.    Historical Provider, MD  ondansetron (ZOFRAN ODT) 4 MG disintegrating tablet Take 1 tablet (4 mg total) by mouth every 8 (eight) hours as needed for nausea. 02/18/13   Ardyth Gal, MD  oxyCODONE (OXY IR/ROXICODONE) 5 MG immediate release tablet Take 1 tablet (5 mg total) by mouth every 8 (eight) hours as needed. 05/01/13   [name], MD    BP 158/117 mmHg  Pulse 119  Temp(Src) 98.2 F (36.8 C) (Oral)  Resp 20  Wt 127 lb 13.9 oz (58 kg)  SpO2 100% Physical Exam General: Well-developed, well-nourished female in no acute distress; appearance consistent with age of record HENT: normocephalic; atraumatic Eyes: pupils equal, round and reactive to light; extraocular muscles intact Neck: supple Heart: tachycardic with regular rate and rhythm Chest: non tender Lungs: clear to auscultation bilaterally Abdomen: soft; nondistended; mild epigastric tenderness; no masses or hepatosplenomegaly; bowel sounds present Extremities: No deformity; full range of motion; pulses normal, dialysis fistula in left upper with pulse and thrill Neurologic: Awake, alert and oriented; motor function intact in all extremities and symmetric; no facial droop Skin: Warm and dry Psychiatric: Normal mood and affect  ED Course  Procedures (including critical care time) DIAGNOSTIC STUDIES: Oxygen Saturation is 100% on RA, normal by my interpretation.    COORDINATION OF CARE: 11:58 PM- Pt advised of plan for treatment which includes labs and pt agrees.    MDM   Nursing notes and vitals signs, including pulse oximetry, reviewed.  Summary of this visit's results, reviewed by myself:   EKG Interpretation  Date/Time:  Saturday August 29 2014 23:52:31 EST Ventricular Rate:  114 PR Interval:  232 QRS Duration: 90 QT Interval:  286 QTC Calculation: 394 R Axis:   94 Text Interpretation:  Sinus tachycardia with 1st degree A-V block Biatrial enlargement Rightward axis ST \\T \ T wave abnormality, consider inferolateral ischemia Abnormal ECG Lateral T-wave inversions not seen 22 Apr 2013, rate is faster Unchanged vs 02 Feb 2012 Confirmed by [facility]  MD, Jonny Ruiz ([id number]) on 08/30/2014 12:04:19 AM      Labs:  Results for orders placed or performed during the hospital encounter of 08/29/14 (from the past 24 hour(s))  Basic metabolic panel     Status:  Abnormal   Collection Time: 08/30/14 12:17 AM  Result Value Ref Range   Sodium 140 137 - 147 mEq/L   Potassium 5.5 (H) 3.7 - 5.3 mEq/L   Chloride 95 (L) 96 - 112 mEq/L   CO2 24 19 - 32 mEq/L   Glucose, Bld 112 (H) 70 - 99 mg/dL   BUN 46 (H) 6 - 23 mg/dL   Creatinine, Ser 60.45 (H) 0.50 - 1.10 mg/dL   Calcium 40.9 (H) 8.4 - 10.5 mg/dL   GFR calc non Af Amer 4 (L) >90 mL/min   GFR calc Af Amer 5 (L) >90 mL/min   Anion gap 21 (H) 5 - 15  CBC with Differential     Status: Abnormal   Collection Time: 08/30/14 12:17 AM  Result Value Ref Range   WBC 6.6 4.0 - 10.5 K/uL   RBC 3.76 (L) 3.87 - 5.11  MIL/uL   Hemoglobin 10.8 (L) 12.0 - 15.0 g/dL   HCT 48.1 (L) 85.6 - 31.4 %   MCV 89.4 78.0 - 100.0 fL   MCH 28.7 26.0 - 34.0 pg   MCHC 32.1 30.0 - 36.0 g/dL   RDW 97.0 (H) 26.3 - 78.5 %   Platelets 198 150 - 400 K/uL   Neutrophils Relative % 67 43 - 77 %   Neutro Abs 4.4 1.7 - 7.7 K/uL   Lymphocytes Relative 24 12 - 46 %   Lymphs Abs 1.6 0.7 - 4.0 K/uL   Monocytes Relative 8 3 - 12 %   Monocytes Absolute 0.5 0.1 - 1.0 K/uL   Eosinophils Relative 0 0 - 5 %   Eosinophils Absolute 0.0 0.0 - 0.7 K/uL   Basophils Relative 1 0 - 1 %   Basophils Absolute 0.0 0.0 - 0.1 K/uL  Troponin I     Status: None   Collection Time: 08/30/14 12:17 AM  Result Value Ref Range   Troponin I <0.30 <0.30 ng/mL    Imaging Studies: Dg Chest 2 View  08/30/2014   CLINICAL DATA:  Chest pain started 12 hr ago.  EXAM: CHEST  2 VIEW  COMPARISON:  Radiograph 04/26/2013  FINDINGS: Enlarged cardiac silhouette is increased in diameter compared to comparison exam. There is a opacity at the right lung base. Trace pleural fluid is noted. No pulmonary edema. No pneumothorax  IMPRESSION: 1. Interval enlargement of the cardiac silhouette consists with cardiomegaly or pericardial effusion. 2. Right lower lobe represents pneumonia, effusion, or atelectasis.   Electronically Signed   By: Genevive Bi M.D.   On: 08/30/2014 00:48    Ct Angio Chest Pe W/cm &/or Wo Cm  08/30/2014   CLINICAL DATA:  Moderate upper substernal chest pain starting about 12 hr ago. Shortness of breath and diaphoresis. History of polycystic kidney disease with end-stage renal failure on hemodialysis. History of cardiomyopathy.  EXAM: CT ANGIOGRAPHY CHEST WITH CONTRAST  TECHNIQUE: Multidetector CT imaging of the chest was performed using the standard protocol during bolus administration of intravenous contrast. Multiplanar CT image reconstructions and MIPs were obtained to evaluate the vascular anatomy.  CONTRAST:  OMNIPAQUE IOHEXOL 350 MG/ML SOLN  COMPARISON:  04/23/2013  FINDINGS: Technically adequate study with good opacification of the central and segmental pulmonary arteries. No focal filling defects. No evidence of significant pulmonary embolus.  Diffuse cardiac enlargement. Normal caliber thoracic aorta. Great vessel origins are patent. Esophagus is decompressed. No significant lymphadenopathy in the chest. Lungs appear clear and expanded. No focal airspace disease or consolidation. Slight linear fibrosis in the right and left lung base. No pleural effusions. No pneumothorax.  Included portions of the upper abdominal organs demonstrate reflux of contrast material in the liver consistent with heart failure. There is free fluid in the upper abdomen suggesting ascites. Cysts in the kidneys.  Review of the MIP images confirms the above findings.  IMPRESSION: No evidence of significant pulmonary embolus. Cardiac enlargement with passive congestion into the liver consistent with history of cardiomyopathy. No evidence of active pulmonary disease. Upper abdominal ascites.   Electronically Signed   By: Burman Nieves M.D.   On: 08/30/2014 01:35   1:56 AM Pain now 4 out of 10 after fentanyl. No evidence of pulmonary embolism or pulmonary edema. Troponin is negative. The patient is scheduled for dialysis this afternoon at 3 PM. Patient remains tachycardic but  IV fluid bolus not given due to end-stage renal disease with scheduled dialysis today.  Carlisle Beers Zoiey Christy, MD 08/30/14 0200

## 2014-08-30 ENCOUNTER — Emergency Department (HOSPITAL_BASED_OUTPATIENT_CLINIC_OR_DEPARTMENT_OTHER): Payer: Medicare Other

## 2014-08-30 DIAGNOSIS — R079 Chest pain, unspecified: Secondary | ICD-10-CM | POA: Diagnosis not present

## 2014-08-30 LAB — TROPONIN I: Troponin I: <0.3 ng/mL (ref <0.30)

## 2014-08-30 LAB — CBC WITH DIFFERENTIAL/PLATELET
BASOS ABS: 0 10*3/uL (ref 0.0–0.1)
BASOS PCT: 1 % (ref 0–1)
EOS ABS: 0 10*3/uL (ref 0.0–0.7)
EOS PCT: 0 % (ref 0–5)
HEMATOCRIT: 33.6 % — AB (ref 36.0–46.0)
Hemoglobin: 10.8 g/dL — ABNORMAL LOW (ref 12.0–15.0)
Lymphocytes Relative: 24 % (ref 12–46)
Lymphs Abs: 1.6 10*3/uL (ref 0.7–4.0)
MCH: 28.7 pg (ref 26.0–34.0)
MCHC: 32.1 g/dL (ref 30.0–36.0)
MCV: 89.4 fL (ref 78.0–100.0)
MONO ABS: 0.5 10*3/uL (ref 0.1–1.0)
Monocytes Relative: 8 % (ref 3–12)
Neutro Abs: 4.4 10*3/uL (ref 1.7–7.7)
Neutrophils Relative %: 67 % (ref 43–77)
PLATELETS: 198 10*3/uL (ref 150–400)
RBC: 3.76 MIL/uL — ABNORMAL LOW (ref 3.87–5.11)
RDW: 17 % — AB (ref 11.5–15.5)
WBC: 6.6 10*3/uL (ref 4.0–10.5)

## 2014-08-30 LAB — BASIC METABOLIC PANEL
Anion gap: 21 — ABNORMAL HIGH (ref 5–15)
BUN: 46 mg/dL — ABNORMAL HIGH (ref 6–23)
CO2: 24 mEq/L (ref 19–32)
Calcium: 11.2 mg/dL — ABNORMAL HIGH (ref 8.4–10.5)
Chloride: 95 mEq/L — ABNORMAL LOW (ref 96–112)
Creatinine, Ser: 10.9 mg/dL — ABNORMAL HIGH (ref 0.50–1.10)
GFR calc Af Amer: 5 mL/min — ABNORMAL LOW (ref >90)
GFR calc non Af Amer: 4 mL/min — ABNORMAL LOW (ref >90)
Glucose, Bld: 112 mg/dL — ABNORMAL HIGH (ref 70–99)
Potassium: 5.5 mEq/L — ABNORMAL HIGH (ref 3.7–5.3)
Sodium: 140 mEq/L (ref 137–147)

## 2014-08-30 MED ORDER — IOHEXOL 350 MG/ML SOLN
100.0000 mL | Freq: Once | INTRAVENOUS | Status: AC | PRN
  Administered 2014-08-30: 100 mL via INTRAVENOUS

## 2014-08-30 MED ORDER — FENTANYL CITRATE 0.05 MG/ML IJ SOLN
INTRAMUSCULAR | Status: AC
  Filled 2014-08-30: qty 2

## 2014-08-30 MED ORDER — FENTANYL CITRATE 0.05 MG/ML IJ SOLN
100.0000 ug | Freq: Once | INTRAMUSCULAR | Status: AC
  Administered 2014-08-30: 50 ug via INTRAMUSCULAR

## 2014-08-30 MED ORDER — DIPHENHYDRAMINE HCL 50 MG/ML IJ SOLN
INTRAMUSCULAR | Status: AC
  Filled 2014-08-30: qty 1

## 2014-08-30 MED ORDER — FENTANYL CITRATE 0.05 MG/ML IJ SOLN
50.0000 ug | Freq: Once | INTRAMUSCULAR | Status: DC
  Filled 2014-08-30: qty 2

## 2014-08-30 MED ORDER — DIPHENHYDRAMINE HCL 50 MG/ML IJ SOLN
25.0000 mg | Freq: Once | INTRAMUSCULAR | Status: AC
  Administered 2014-08-30: 25 mg via INTRAVENOUS

## 2014-08-30 MED ORDER — OXYCODONE HCL 5 MG PO TABS: 5.0000 mg | ORAL_TABLET | Freq: Three times a day (TID) | ORAL | Status: DC | PRN

## 2014-08-30 NOTE — Discharge Instructions (Signed)

## 2014-12-16 DIAGNOSIS — Z95810 Presence of automatic (implantable) cardiac defibrillator: Secondary | ICD-10-CM

## 2014-12-16 HISTORY — DX: Presence of automatic (implantable) cardiac defibrillator: Z95.810

## 2014-12-20 ENCOUNTER — Encounter (HOSPITAL_BASED_OUTPATIENT_CLINIC_OR_DEPARTMENT_OTHER): Payer: Self-pay

## 2014-12-20 ENCOUNTER — Emergency Department (HOSPITAL_BASED_OUTPATIENT_CLINIC_OR_DEPARTMENT_OTHER)
Admission: EM | Admit: 2014-12-20 | Discharge: 2014-12-20 | Discharge status: Against Medical Advice | Payer: Medicare Other | Attending: Emergency Medicine | Admitting: Emergency Medicine

## 2014-12-20 ENCOUNTER — Emergency Department (HOSPITAL_BASED_OUTPATIENT_CLINIC_OR_DEPARTMENT_OTHER): Payer: Medicare Other

## 2014-12-20 DIAGNOSIS — Z940 Kidney transplant status: Secondary | ICD-10-CM | POA: Insufficient documentation

## 2014-12-20 DIAGNOSIS — Q613 Polycystic kidney, unspecified: Secondary | ICD-10-CM | POA: Insufficient documentation

## 2014-12-20 DIAGNOSIS — I509 Heart failure, unspecified: Secondary | ICD-10-CM | POA: Diagnosis not present

## 2014-12-20 DIAGNOSIS — Z950 Presence of cardiac pacemaker: Secondary | ICD-10-CM | POA: Diagnosis not present

## 2014-12-20 DIAGNOSIS — Z872 Personal history of diseases of the skin and subcutaneous tissue: Secondary | ICD-10-CM | POA: Insufficient documentation

## 2014-12-20 DIAGNOSIS — S4992XA Unspecified injury of left shoulder and upper arm, initial encounter: Secondary | ICD-10-CM | POA: Diagnosis present

## 2014-12-20 DIAGNOSIS — Y998 Other external cause status: Secondary | ICD-10-CM | POA: Diagnosis not present

## 2014-12-20 DIAGNOSIS — Z87891 Personal history of nicotine dependence: Secondary | ICD-10-CM | POA: Diagnosis not present

## 2014-12-20 DIAGNOSIS — Z3202 Encounter for pregnancy test, result negative: Secondary | ICD-10-CM | POA: Insufficient documentation

## 2014-12-20 DIAGNOSIS — I120 Hypertensive chronic kidney disease with stage 5 chronic kidney disease or end stage renal disease: Secondary | ICD-10-CM | POA: Insufficient documentation

## 2014-12-20 DIAGNOSIS — T148XXA Other injury of unspecified body region, initial encounter: Secondary | ICD-10-CM

## 2014-12-20 DIAGNOSIS — Z5320 Procedure and treatment not carried out because of patient's decision for unspecified reasons: Secondary | ICD-10-CM

## 2014-12-20 DIAGNOSIS — Y92009 Unspecified place in unspecified non-institutional (private) residence as the place of occurrence of the external cause: Secondary | ICD-10-CM | POA: Diagnosis not present

## 2014-12-20 DIAGNOSIS — Z86711 Personal history of pulmonary embolism: Secondary | ICD-10-CM | POA: Diagnosis not present

## 2014-12-20 DIAGNOSIS — N186 End stage renal disease: Secondary | ICD-10-CM | POA: Diagnosis not present

## 2014-12-20 DIAGNOSIS — W010XXA Fall on same level from slipping, tripping and stumbling without subsequent striking against object, initial encounter: Secondary | ICD-10-CM | POA: Insufficient documentation

## 2014-12-20 DIAGNOSIS — W19XXXA Unspecified fall, initial encounter: Secondary | ICD-10-CM

## 2014-12-20 DIAGNOSIS — Z5329 Procedure and treatment not carried out because of patient's decision for other reasons: Secondary | ICD-10-CM

## 2014-12-20 DIAGNOSIS — Z79899 Other long term (current) drug therapy: Secondary | ICD-10-CM | POA: Diagnosis not present

## 2014-12-20 DIAGNOSIS — Y9301 Activity, walking, marching and hiking: Secondary | ICD-10-CM | POA: Insufficient documentation

## 2014-12-20 DIAGNOSIS — Z862 Personal history of diseases of the blood and blood-forming organs and certain disorders involving the immune mechanism: Secondary | ICD-10-CM | POA: Diagnosis not present

## 2014-12-20 DIAGNOSIS — T148 Other injury of unspecified body region: Secondary | ICD-10-CM | POA: Insufficient documentation

## 2014-12-20 LAB — BASIC METABOLIC PANEL
ANION GAP: 10 (ref 5–15)
BUN: 69 mg/dL — ABNORMAL HIGH (ref 6–23)
CALCIUM: 8.7 mg/dL (ref 8.4–10.5)
CHLORIDE: 100 mmol/L (ref 96–112)
CO2: 26 mmol/L (ref 19–32)
Creatinine, Ser: 10.02 mg/dL — ABNORMAL HIGH (ref 0.50–1.10)
GFR calc Af Amer: 6 mL/min — ABNORMAL LOW (ref >90)
GFR calc non Af Amer: 5 mL/min — ABNORMAL LOW (ref >90)
Glucose, Bld: 95 mg/dL (ref 70–99)
POTASSIUM: 5.5 mmol/L — AB (ref 3.5–5.1)
SODIUM: 136 mmol/L (ref 135–145)

## 2014-12-20 LAB — CBC WITH DIFFERENTIAL/PLATELET
Basophils Absolute: 0.1 10*3/uL (ref 0.0–0.1)
Basophils Relative: 1 % (ref 0–1)
Eosinophils Absolute: 0.2 10*3/uL (ref 0.0–0.7)
Eosinophils Relative: 2 % (ref 0–5)
HEMATOCRIT: 32.9 % — AB (ref 36.0–46.0)
HEMOGLOBIN: 10.4 g/dL — AB (ref 12.0–15.0)
LYMPHS PCT: 27 % (ref 12–46)
Lymphs Abs: 2.6 10*3/uL (ref 0.7–4.0)
MCH: 30 pg (ref 26.0–34.0)
MCHC: 31.6 g/dL (ref 30.0–36.0)
MCV: 94.8 fL (ref 78.0–100.0)
MONO ABS: 1.4 10*3/uL — AB (ref 0.1–1.0)
Monocytes Relative: 14 % — ABNORMAL HIGH (ref 3–12)
NEUTROS PCT: 56 % (ref 43–77)
Neutro Abs: 5.6 10*3/uL (ref 1.7–7.7)
PLATELETS: 274 10*3/uL (ref 150–400)
RBC: 3.47 MIL/uL — AB (ref 3.87–5.11)
RDW: 18.7 % — AB (ref 11.5–15.5)
WBC: 9.8 10*3/uL (ref 4.0–10.5)

## 2014-12-20 LAB — HCG, SERUM, QUALITATIVE: Preg, Serum: NEGATIVE

## 2014-12-20 MED ORDER — OXYCODONE HCL 5 MG PO TABS: 5.0000 mg | ORAL_TABLET | ORAL | Status: DC | PRN

## 2014-12-20 MED ORDER — DIPHENHYDRAMINE HCL 50 MG/ML IJ SOLN
50.0000 mg | Freq: Once | INTRAMUSCULAR | Status: AC
  Administered 2014-12-20: 50 mg via INTRAVENOUS
  Filled 2014-12-20: qty 1

## 2014-12-20 MED ORDER — IOHEXOL 300 MG/ML  SOLN
60.0000 mL | Freq: Once | INTRAMUSCULAR | Status: AC | PRN
  Administered 2014-12-20: 60 mL via INTRAVENOUS

## 2014-12-20 MED ORDER — HYDROMORPHONE HCL 1 MG/ML IJ SOLN
0.5000 mg | Freq: Once | INTRAMUSCULAR | Status: AC
  Administered 2014-12-20: 0.5 mg via INTRAVENOUS
  Filled 2014-12-20: qty 1

## 2014-12-20 MED ORDER — METHYLPREDNISOLONE SODIUM SUCC 125 MG IJ SOLR
125.0000 mg | Freq: Once | INTRAMUSCULAR | Status: AC
  Administered 2014-12-20: 125 mg via INTRAVENOUS
  Filled 2014-12-20: qty 2

## 2014-12-20 MED ORDER — ONDANSETRON HCL 4 MG/2ML IJ SOLN
4.0000 mg | Freq: Once | INTRAMUSCULAR | Status: AC
  Administered 2014-12-20: 4 mg via INTRAVENOUS
  Filled 2014-12-20: qty 2

## 2014-12-20 NOTE — ED Notes (Addendum)
Pt states she cannot stay any longer at ED, states she has to leave because "something important is about to happen, my sister is about to have her baby" EDP notified, IV access removed, Pt AMA. Pt would not stay to sign AMA, states "I have to go."

## 2014-12-20 NOTE — Discharge Instructions (Signed)
Your leaving the emergency room AGAINST MEDICAL ADVICE. As we have discussed return to the emergency room at any time, he can call 911 if symptoms worsen or change.  Take oxycodone for breakthrough pain, do not drink alcohol, drive, care for children or do other critical tasks while taking oxycodone.  Please follow with your primary care doctor in the next 2 days for a check-up. They must obtain records for further management.   Do not hesitate to return to the Emergency Department for any new, worsening or concerning symptoms.    Contusion A contusion is a deep bruise. Contusions happen when an injury causes bleeding under the skin. Signs of bruising include pain, puffiness (swelling), and discolored skin. The contusion may turn blue, purple, or yellow. HOME CARE   Put ice on the injured area.  Put ice in a plastic bag.  Place a towel between your skin and the bag.  Leave the ice on for 15-20 minutes, 03-04 times a day.  Only take medicine as told by your doctor.  Rest the injured area.  If possible, raise (elevate) the injured area to lessen puffiness. GET HELP RIGHT AWAY IF:   You have more bruising or puffiness.  You have pain that is getting worse.  Your puffiness or pain is not helped by medicine. MAKE SURE YOU:   Understand these instructions.  Will watch your condition.  Will get help right away if you are not doing well or get worse. Document Released: 03/27/2008 Document Revised: 01/01/2012 Document Reviewed: 08/14/2011 [facility] Patient Information 2015 [place], [place]. This information is not intended to replace advice given to you by your health care provider. Make sure you discuss any questions you have with your health care provider.

## 2014-12-20 NOTE — ED Notes (Signed)
Pt reports onset chest pain near pace maker insertion site sharp stabbing in nature

## 2014-12-20 NOTE — ED Provider Notes (Signed)
CSN: [id number]     Arrival date & time 12/20/14  1920 History   First MD Initiated Contact with Patient 12/20/14 1955     Chief Complaint  Patient presents with  . fall      (Consider location/radiation/quality/duration/timing/severity/associated sxs/prior Treatment) HPI    [name] Small is a 24 y.o. female with past medical history significant for ESRD on dialysis, hypertension, pulmonary embolism (she is not anticoagulated, has not been in over 2 years) cardiomyopathy and recent pacemaker placement complaining of pain to right anterior chest status post fall this afternoon. Patient states that she slipped while walking up the steps to her apartment states she fell approximately 12 steps. She denies head trauma, LOC, cervicalgia, shortness of breath she has pain on the side of the pacemaker which was placed 5 days ago at [facility] in [place] (Dr. Governor Specking). She states that the area is significantly  more swollen than before the fall. Pain is 9 out of 10. No exacerbating or alleviating factors identified. No pain medications taken prior to arrival. She denies any shoulder pain, abdominal pain, difficulty walking, difficulty moving major joints.been on dialysis for 4 years and is scheduled to have her next dialysis in 2 days.  PCP Elwyn Reach  Past Medical History  Diagnosis Date  . Hemodialysis patient   . Hypertension   . Pulmonary emboli 01/2012    Bilateral, moderate clot burden, areas of pulmonary infarction and central necrosis  . CHF (congestive heart failure)   . Cardiomyopathy   . Dysrhythmia     at times per pt.  . Anemia   . H/O transfusion of packed red blood cells   . 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 Polycystic kidney disease     [facility]  T,Th, S  . Cellulitis and abscess of face 03/22/2013   Past Surgical History  Procedure Laterality Date  .  Nephrectomy    . Av fistula placement    . Kidney transplant  2008    failed  . Tonsillectomy      as a child.  . Adenoidectomy    . Incision and drainage abscess Right 03/21/2013    Procedure: INCISION AND DRAINAGE RIGHT CHEEK ABSCESS REMOVAL OF FOREIGN BODY;  Surgeon: Serena Colonel, MD;  Location: [facility] OR;  Service: ENT;  Laterality: Right;   Family History  Problem Relation Age of Onset  . Polycystic kidney disease Father    History  Substance Use Topics  . Smoking status: Former Smoker -- 0.20 packs/day    Types: Cigarettes    Quit date: 05/06/2012  . Smokeless tobacco: Never Used  . Alcohol Use: No   OB History    No data available     Review of Systems  10 systems reviewed and found to be negative, except as noted in the HPI.   Allergies  Tramadol; Morphine and related; and Vicodin  Home Medications   Prior to Admission medications   Medication Sig Start Date End Date Taking? Authorizing Provider  amLODipine (NORVASC) 5 MG tablet Take 5 mg by mouth daily.    Historical Provider, MD  calcium acetate (PHOSLO) 667 MG capsule Take 2 capsules (1,334 mg total) by mouth 3 (three) times daily with meals. Take  with snacks 05/01/13   [name], MD  famotidine (PEPCID) 20 MG tablet Take 20 mg by mouth daily as needed. For heartburn 03/24/13   Theadora Rama  D Hongalgi, MD  fluticasone (FLONASE) 50 MCG/ACT nasal spray Place 2 sprays into the nose daily. 05/01/13   [name], MD  multivitamin (RENA-VIT) TABS tablet Take 1 tablet by mouth every morning.    Historical Provider, MD  ondansetron (ZOFRAN ODT) 4 MG disintegrating tablet Take 1 tablet (4 mg total) by mouth every 8 (eight) hours as needed for nausea. 02/18/13   Ardyth Gal, MD  oxyCODONE (OXY IR/ROXICODONE) 5 MG immediate release tablet Take 1 tablet (5 mg total) by mouth every 8 (eight) hours as needed (for pain). 08/30/14   John L Molpus, MD   BP 135/94 mmHg  Pulse 117  Temp(Src) 99.5 F (37.5 C) (Oral)  Resp 18   Ht 5\' 3"  (1.6 m)  Wt 130 lb (58.968 kg)  BMI 23.03 kg/m2  SpO2 100%  LMP 09/19/2014 Physical Exam  Constitutional: She is oriented to person, place, and time. She appears well-developed and well-nourished. No distress.  HENT:  Head: Normocephalic and atraumatic.  Mouth/Throat: Oropharynx is clear and moist.  Eyes: Conjunctivae and EOM are normal. Pupils are equal, round, and reactive to light.  Neck: Normal range of motion. Neck supple.  No midline C-spine  tenderness to palpation or step-offs appreciated. Patient has full range of motion without pain.   Cardiovascular: Normal rate, regular rhythm and intact distal pulses.   Pulmonary/Chest: Effort normal and breath sounds normal. No stridor. No respiratory distress. She has no wheezes. She has no rales. She exhibits tenderness.  She is tender along the left clavicle, no deformities appreciated.  There is an area of swelling approximately 10 cm diameter over the pacemaker area on th upper right chest. No dehiscence of wound. Steri-Strips are in place, no bleeding  Abdominal: Soft. Bowel sounds are normal.  Musculoskeletal: Normal range of motion.  Neurological: She is alert and oriented to person, place, and time.  Psychiatric: She has a normal mood and affect.  Nursing note and vitals reviewed.   ED Course  Procedures (including critical care time) Labs Review Labs Reviewed  BASIC METABOLIC PANEL - Abnormal; Notable for the following:    Potassium 5.5 (*)    BUN 69 (*)    Creatinine, Ser 10.02 (*)    GFR calc non Af Amer 5 (*)    GFR calc Af Amer 6 (*)    All other components within normal limits  CBC WITH DIFFERENTIAL/PLATELET - Abnormal; Notable for the following:    RBC 3.47 (*)    Hemoglobin 10.4 (*)    HCT 32.9 (*)    RDW 18.7 (*)    Monocytes Relative 14 (*)    Monocytes Absolute 1.4 (*)    All other components within normal limits  HCG, SERUM, QUALITATIVE    Imaging Review Ct Chest W Contrast  12/20/2014    CLINICAL DATA:  Fall with subsequent pain and swelling over a pacemaker placed 5 days ago. No clinical indication of infection.  EXAM: CT CHEST WITH CONTRAST  TECHNIQUE: Multidetector CT imaging of the chest was performed during intravenous contrast administration.  CONTRAST:  60mL OMNIPAQUE IOHEXOL 300 MG/ML  SOLN  COMPARISON:  08/30/2014  FINDINGS: THORACIC INLET/BODY WALL:  Intervally placed pacer on the right, with soft tissue swelling and expansion around the generator pack. There are bubbles of gas present around the ventral and medial aspects of the generator, dissecting in the soft tissues towards the sternum. The right ventricular pacer lead continues to the distal interventricular septum.  MEDIASTINUM:  Marked cardiomegaly. No pericardial  effusion. No acute vascular findings or adenopathy.  LUNG WINDOWS:  Cluster of centrilobular nodules in the right apical lung. No segmental consolidation. Mild retrocardiac atelectasis. No hemothorax or pneumothorax.  UPPER ABDOMEN:  Unchanged heterogeneous enhancement of the upper pole left kidney. There is periportal edema and heterogeneous liver enhanced, likely from passive congestion.  OSSEOUS:  No acute fracture. Subchondral erosions at the bilateral sternoclavicular joints which is chronic. The bones are diffusely sclerotic in appearance.  These results were called by telephone at the time of interpretation on 12/20/2014 at 11:12 pm to Dr. Wynetta Emery , who verbally acknowledged these results.  IMPRESSION: 1. Postoperative or posttraumatic swelling around the pacer battery pack in the right chest. Assuming no infectious symptoms, gas in and around the pocket is considered postoperative. 2. No evidence of acute intrathoracic injury. 3. Mild, presumably incidental bronchiolar impaction in the right upper lobe. 4. Renal osteodystrophy.   Electronically Signed   By: Marnee Spring M.D.   On: 12/20/2014 23:13     EKG Interpretation None        Date:  12/20/2014  Rate: 108  Rhythm: sinus tachycardia  QRS Axis: normal  Intervals: PR prolonged  ST/T Wave abnormalities: nonspecific ST/T changes  Conduction Disutrbances:first-degree A-V block   Narrative Interpretation:   Old EKG Reviewed: none available and unchanged    MDM   Final diagnoses:  Left against medical advice  Fall at home, initial encounter  Hematoma  S/P placement of cardiac pacemaker    Filed Vitals:   12/20/14 1933 12/20/14 2237  BP: 135/94 146/80  Pulse: 117 96  Temp: 99.5 F (37.5 C) 98.4 F (36.9 C)  TempSrc: Oral Oral  Resp: 18 18  Height:  (1.6 m)   Weight: 130 lb (58.968 kg)   SpO2: 100% 100%    Medications  HYDROmorphone (DILAUDID) injection 0.5 mg (0.5 mg Intravenous Given 12/20/14 2026)  ondansetron (ZOFRAN) injection 4 mg (4 mg Intravenous Given 12/20/14 2026)  methylPREDNISolone sodium succinate (SOLU-MEDROL) 125 mg/2 mL injection 125 mg (125 mg Intravenous Given 12/20/14 2118)  diphenhydrAMINE (BENADRYL) injection 50 mg (50 mg Intravenous Given 12/20/14 2118)  iohexol (OMNIPAQUE) 300 MG/ML solution 60 mL (60 mLs Intravenous Contrast Given 12/20/14 2224)    [name] Small is a pleasant 24 y.o. female presenting with pain to the right chest status post slip and fall this afternoon. She is significantly swollen around the area pacemaker insertion however there is no dehiscence of the wound. Patient states that the swelling has increased significantly after the fall. It is unclear if there was direct trauma to the incision site.   [name] Small is an adult of sound mindand is alert and oriented x3, has capacity for medical decision making . We have discussed the signs and symptoms of increasing hematoma at surgical site status post slip and fall.  We have discussed the limitations of the exam and recommended treatment plan of in the ED to wait her for the results of the CAT scan..  We have discussed the risks of forgoing treatment including  death, disability. We have also discussed treatment plan alternatives which the patient has refused. I have asked to speak with family members and friends, pt has refused. We have had 2 discussions about my  concerns and potential alternative treatment plans over the course of their stay in the ED. I have asked the nursing staff to also encourage the patient to stay.  [name] Small has specifically refused his stay in the emergency room  for CAT scan read and discussion with her cardiothoracic surgeon and is refusing any further care and is leaving against medical advice. I have answered all of the patients questions, advised them to not hesitate to call 911 and return to the ED if they change their mind.  She has left the emergency room before the nurse could give her her discharge paperwork. I have confirmed with RN that she did not have her IV in place when she left the ED.  Evaluation does not show pathology that would require ongoing emergent intervention or inpatient treatment. Pt is hemodynamically stable and mentating appropriately. Discussed findings and plan with patient/guardian, who agrees with care plan. All questions answered. Return precautions discussed and outpatient follow up given.   New Prescriptions   OXYCODONE (ROXICODONE) 5 MG IMMEDIATE RELEASE TABLET    Take 1 tablet (5 mg total) by mouth every 4 (four) hours as needed for severe pain.         Wynetta Emery, PA-C 12/20/14 2332  Wynetta Emery, PA-C 12/20/14 8101  Juliet Rude. Rubin Payor, MD 12/22/14 2126

## 2014-12-22 HISTORY — PX: IMPLANTABLE CARDIOVERTER DEFIBRILLATOR IMPLANT: SHX5860

## 2015-01-25 ENCOUNTER — Emergency Department (HOSPITAL_BASED_OUTPATIENT_CLINIC_OR_DEPARTMENT_OTHER): Payer: Medicare Other

## 2015-01-25 ENCOUNTER — Inpatient Hospital Stay (HOSPITAL_BASED_OUTPATIENT_CLINIC_OR_DEPARTMENT_OTHER)
Admission: EM | Admit: 2015-01-25 | Discharge: 2015-01-29 | DRG: 640 | Disposition: A | Discharge status: Home / Self Care | Payer: Medicare Other | Attending: Internal Medicine | Admitting: Internal Medicine

## 2015-01-25 ENCOUNTER — Encounter (HOSPITAL_BASED_OUTPATIENT_CLINIC_OR_DEPARTMENT_OTHER): Payer: Self-pay

## 2015-01-25 DIAGNOSIS — Z7901 Long term (current) use of anticoagulants: Secondary | ICD-10-CM

## 2015-01-25 DIAGNOSIS — Z885 Allergy status to narcotic agent status: Secondary | ICD-10-CM

## 2015-01-25 DIAGNOSIS — I120 Hypertensive chronic kidney disease with stage 5 chronic kidney disease or end stage renal disease: Secondary | ICD-10-CM | POA: Diagnosis present

## 2015-01-25 DIAGNOSIS — Z86711 Personal history of pulmonary embolism: Secondary | ICD-10-CM

## 2015-01-25 DIAGNOSIS — E8770 Fluid overload, unspecified: Secondary | ICD-10-CM | POA: Diagnosis not present

## 2015-01-25 DIAGNOSIS — N186 End stage renal disease: Secondary | ICD-10-CM | POA: Diagnosis present

## 2015-01-25 DIAGNOSIS — Z992 Dependence on renal dialysis: Secondary | ICD-10-CM

## 2015-01-25 DIAGNOSIS — R188 Other ascites: Secondary | ICD-10-CM | POA: Diagnosis present

## 2015-01-25 DIAGNOSIS — D649 Anemia, unspecified: Secondary | ICD-10-CM | POA: Diagnosis present

## 2015-01-25 DIAGNOSIS — Z95810 Presence of automatic (implantable) cardiac defibrillator: Secondary | ICD-10-CM

## 2015-01-25 DIAGNOSIS — Z888 Allergy status to other drugs, medicaments and biological substances status: Secondary | ICD-10-CM

## 2015-01-25 DIAGNOSIS — I5020 Unspecified systolic (congestive) heart failure: Secondary | ICD-10-CM

## 2015-01-25 DIAGNOSIS — R079 Chest pain, unspecified: Secondary | ICD-10-CM

## 2015-01-25 DIAGNOSIS — R112 Nausea with vomiting, unspecified: Secondary | ICD-10-CM | POA: Diagnosis present

## 2015-01-25 DIAGNOSIS — Z9119 Patient's noncompliance with other medical treatment and regimen: Secondary | ICD-10-CM | POA: Diagnosis present

## 2015-01-25 DIAGNOSIS — Z87891 Personal history of nicotine dependence: Secondary | ICD-10-CM

## 2015-01-25 DIAGNOSIS — I5023 Acute on chronic systolic (congestive) heart failure: Secondary | ICD-10-CM | POA: Diagnosis present

## 2015-01-25 DIAGNOSIS — I429 Cardiomyopathy, unspecified: Secondary | ICD-10-CM | POA: Diagnosis present

## 2015-01-25 DIAGNOSIS — R109 Unspecified abdominal pain: Secondary | ICD-10-CM | POA: Diagnosis present

## 2015-01-25 DIAGNOSIS — I5022 Chronic systolic (congestive) heart failure: Secondary | ICD-10-CM | POA: Diagnosis present

## 2015-01-25 DIAGNOSIS — R0602 Shortness of breath: Secondary | ICD-10-CM | POA: Diagnosis not present

## 2015-01-25 DIAGNOSIS — Z940 Kidney transplant status: Secondary | ICD-10-CM

## 2015-01-25 DIAGNOSIS — Q613 Polycystic kidney, unspecified: Secondary | ICD-10-CM

## 2015-01-25 LAB — CBC WITH DIFFERENTIAL/PLATELET
BASOS ABS: 0 10*3/uL (ref 0.0–0.1)
BASOS PCT: 1 % (ref 0–1)
Eosinophils Absolute: 0.1 10*3/uL (ref 0.0–0.7)
Eosinophils Relative: 1 % (ref 0–5)
HCT: 32.1 % — ABNORMAL LOW (ref 36.0–46.0)
HEMOGLOBIN: 10.4 g/dL — AB (ref 12.0–15.0)
Lymphocytes Relative: 26 % (ref 12–46)
Lymphs Abs: 2.1 10*3/uL (ref 0.7–4.0)
MCH: 29.5 pg (ref 26.0–34.0)
MCHC: 32.4 g/dL (ref 30.0–36.0)
MCV: 91.2 fL (ref 78.0–100.0)
Monocytes Absolute: 0.6 10*3/uL (ref 0.1–1.0)
Monocytes Relative: 7 % (ref 3–12)
NEUTROS ABS: 5.3 10*3/uL (ref 1.7–7.7)
Neutrophils Relative %: 65 % (ref 43–77)
Platelets: 225 10*3/uL (ref 150–400)
RBC: 3.52 MIL/uL — ABNORMAL LOW (ref 3.87–5.11)
RDW: 18.2 % — ABNORMAL HIGH (ref 11.5–15.5)
WBC: 8.1 10*3/uL (ref 4.0–10.5)

## 2015-01-25 LAB — COMPREHENSIVE METABOLIC PANEL
ALBUMIN: 4 g/dL (ref 3.5–5.2)
ALT: 11 U/L (ref 0–35)
AST: 24 U/L (ref 0–37)
Alkaline Phosphatase: 238 U/L — ABNORMAL HIGH (ref 39–117)
Anion gap: 19 — ABNORMAL HIGH (ref 5–15)
BILIRUBIN TOTAL: 0.3 mg/dL (ref 0.3–1.2)
BUN: 95 mg/dL — ABNORMAL HIGH (ref 6–23)
CHLORIDE: 95 mmol/L — AB (ref 96–112)
CO2: 22 mmol/L (ref 19–32)
CREATININE: 11.89 mg/dL — AB (ref 0.50–1.10)
Calcium: 10 mg/dL (ref 8.4–10.5)
GFR calc non Af Amer: 4 mL/min — ABNORMAL LOW (ref >90)
GFR, EST AFRICAN AMERICAN: 5 mL/min — AB (ref >90)
Glucose, Bld: 93 mg/dL (ref 70–99)
Potassium: 4.9 mmol/L (ref 3.5–5.1)
Sodium: 136 mmol/L (ref 135–145)
Total Protein: 8.1 g/dL (ref 6.0–8.3)

## 2015-01-25 LAB — TROPONIN I: TROPONIN I: 0.27 ng/mL — AB (ref <0.031)

## 2015-01-25 MED ORDER — HYDROMORPHONE HCL 1 MG/ML IJ SOLN
1.0000 mg | Freq: Once | INTRAMUSCULAR | Status: AC
  Administered 2015-01-25: 1 mg via INTRAVENOUS
  Filled 2015-01-25: qty 1

## 2015-01-25 MED ORDER — DIPHENHYDRAMINE HCL 50 MG/ML IJ SOLN
25.0000 mg | Freq: Once | INTRAMUSCULAR | Status: AC
  Administered 2015-01-25: 25 mg via INTRAVENOUS
  Filled 2015-01-25: qty 1

## 2015-01-25 NOTE — ED Notes (Signed)
C/o CP and abd pain that started last night

## 2015-01-25 NOTE — ED Notes (Signed)
Pt states she is from [place], won't able to go back for a while; states has not been dialyzed since last Tuesday, abdomen is distended. Pt states she usually goes to the ED and get tapped.

## 2015-01-25 NOTE — ED Provider Notes (Signed)
CSN: [id number]     Arrival date & time 01/25/15  2028 History  This chart was scribed for [name] Bucco, MD by Annye Asa, ED Scribe. This patient was seen in room MH11/MH11 and the patient's care was started at 9:50 PM.    Chief Complaint  Patient presents with  . Chest Pain   Patient is a 24 y.o. female presenting with chest pain. The history is provided by the patient. No language interpreter was used.  Chest Pain Associated symptoms: abdominal pain, shortness of breath and vomiting   Associated symptoms: no back pain, no cough, no diaphoresis, no dizziness, no fatigue, no fever, no headache, no nausea, no numbness and no weakness     HPI Comments: [name] Small is a 24 y.o. female and hemodialysis patient with past medical history of HTN, PE, CHF, cardiomyopathy, dysrhythmia, anemia, history of transfucion of packed red blood cells, polycystic kidney disease, ESRD (renal transplant 10/08, transplant failure 11/12, transplant nephrectomy 11/12) who presents to the Emergency Department complaining of central, "squeezing, tight" chest pain and abdominal pain beginning last night. Her chest pain is exacerbated with deep breathing. She also reports abdominal distention and vomiting, beginning yesterday, and bilateral ankle swelling. Patient reports prior experience with similar symptoms, all related to fluid retention due to CHF.   Patient was last dialyzed 6 days PTA; normal schedule is Tues/Thurs/Sat. She explains that she lives in [place], [place] and has been unable to return home due to transportation and logistical issues; she contacted her doctor to discuss recieving dialysis treatment in the [place] area but was told to come to the ED due to concern regarding her current symptoms. She does not produce urine.   Past blood clot 2 years PTA; no anticoagulants at this time.   Past Medical History  Diagnosis Date  . Hemodialysis patient   . Hypertension   . Pulmonary emboli 01/2012     Bilateral, moderate clot burden, areas of pulmonary infarction and central necrosis  . CHF (congestive heart failure)   . Cardiomyopathy   . Dysrhythmia     at times per pt.  . Anemia   . H/O transfusion of packed red blood cells   . 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 Polycystic kidney disease     [facility]  T,Th, S  . Cellulitis and abscess of face 03/22/2013   Past Surgical History  Procedure Laterality Date  . Nephrectomy    . Av fistula placement    . Kidney transplant  2008    failed  . Tonsillectomy      as a child.  . Adenoidectomy    . Incision and drainage abscess Right 03/21/2013    Procedure: INCISION AND DRAINAGE RIGHT CHEEK ABSCESS REMOVAL OF FOREIGN BODY;  Surgeon: Serena Colonel, MD;  Location: [facility] OR;  Service: ENT;  Laterality: Right;   Family History  Problem Relation Age of Onset  . Polycystic kidney disease Father    History  Substance Use Topics  . Smoking status: Former Smoker -- 0.20 packs/day    Quit date: 05/06/2012  . Smokeless tobacco: Never Used  . Alcohol Use: No   OB History    No data available     Review of Systems  Constitutional: Negative for fever, chills, diaphoresis and fatigue.  HENT: Negative for congestion, rhinorrhea and sneezing.   Eyes: Negative.   Respiratory: Positive for shortness  of breath. Negative for cough and chest tightness.   Cardiovascular: Positive for chest pain and leg swelling.  Gastrointestinal: Positive for vomiting, abdominal pain and abdominal distention. Negative for nausea, diarrhea and blood in stool.  Genitourinary: Negative for frequency, hematuria, flank pain and difficulty urinating.  Musculoskeletal: Negative for back pain and arthralgias.  Skin: Negative for rash.  Neurological: Negative for dizziness, speech difficulty, weakness, numbness and headaches.  All other systems reviewed  and are negative.     Allergies  Tramadol; Morphine and related; and Vicodin  Home Medications   Prior to Admission medications   Medication Sig Start Date End Date Taking? Authorizing Provider  LORazepam (ATIVAN PO) Take by mouth.   Yes Historical Provider, MD  RAMIPRIL PO Take by mouth.   Yes Historical Provider, MD  Sertraline HCl (ZOLOFT PO) Take by mouth.   Yes Historical Provider, MD  amLODipine (NORVASC) 5 MG tablet Take 5 mg by mouth daily.    Historical Provider, MD  calcium acetate (PHOSLO) 667 MG capsule Take 2 capsules (1,334 mg total) by mouth 3 (three) times daily with meals. Take  with snacks 05/01/13   [name], MD  famotidine (PEPCID) 20 MG tablet Take 20 mg by mouth daily as needed. For heartburn 03/24/13   Elease Etienne, MD  multivitamin (RENA-VIT) TABS tablet Take 1 tablet by mouth every morning.    Historical Provider, MD   BP 143/106 mmHg  Pulse 101  Temp(Src) 98.4 F (36.9 C) (Oral)  Resp 16  Ht  (1.6 m)  Wt 130 lb (58.968 kg)  BMI 23.03 kg/m2  SpO2 90%  LMP 08/23/2014 (Approximate) Physical Exam  Constitutional: She is oriented to person, place, and time. She appears well-developed and well-nourished.  HENT:  Head: Normocephalic and atraumatic.  Eyes: Pupils are equal, round, and reactive to light.  Neck: Normal range of motion. Neck supple.  Cardiovascular: Normal rate, regular rhythm and normal heart sounds.   Pulmonary/Chest: Effort normal and breath sounds normal. No respiratory distress. She has no wheezes. She has no rales. She exhibits no tenderness.  Abdominal: Soft. Bowel sounds are normal. She exhibits distension. There is no tenderness. There is no rebound and no guarding.  Distended abdomen with fluid wave  Musculoskeletal: Normal range of motion. She exhibits edema (Mild edema to both lower extremities). She exhibits no tenderness (No calf tenderness).  Lymphadenopathy:    She has no cervical adenopathy.  Neurological: She  is alert and oriented to person, place, and time.  Skin: Skin is warm and dry. No rash noted.  Psychiatric: She has a normal mood and affect.    ED Course  Procedures   DIAGNOSTIC STUDIES: Oxygen Saturation is 97% on RA, adequate by my interpretation.    COORDINATION OF CARE: 10:00 PM Discussed treatment plan with pt at bedside and pt agreed to plan.   Labs Review Labs Reviewed  COMPREHENSIVE METABOLIC PANEL - Abnormal; Notable for the following:    Chloride 95 (*)    BUN 95 (*)    Creatinine, Ser 11.89 (*)    Alkaline Phosphatase 238 (*)    GFR calc non Af Amer 4 (*)    GFR calc Af Amer 5 (*)    Anion gap 19 (*)    All other components within normal limits  CBC WITH DIFFERENTIAL/PLATELET - Abnormal; Notable for the following:    RBC 3.52 (*)    Hemoglobin 10.4 (*)    HCT 32.1 (*)    RDW 18.2 (*)  All other components within normal limits  TROPONIN I - Abnormal; Notable for the following:    Troponin I 0.27 (*)    All other components within normal limits    Imaging Review Dg Chest 2 View  01/25/2015   CLINICAL DATA:  Chest pain  EXAM: CHEST  2 VIEW  COMPARISON:  08/30/2014  FINDINGS: Chronic cardiopericardial enlargement. There is no edema, consolidation, effusion, or pneumothorax.  Single chamber ICD/ pacer from the right is in stable position compared to chest CT 12/20/2014.  Distal clavicle erosions consistent with renal osteodystrophy.  IMPRESSION: 1. Cardiomegaly without failure. 2. Renal osteodystrophy.   Electronically Signed   By: Marnee Spring M.D.   On: 01/25/2015 22:45   US Venous Img Lower Bilateral  01/25/2015   CLINICAL DATA:  Bilateral lower extremity swelling for 2 days. No dialysis for 6 days. History of previous pulmonary embolus.  EXAM: BILATERAL LOWER EXTREMITY VENOUS DOPPLER ULTRASOUND  TECHNIQUE: Gray-scale sonography with graded compression, as well as color Doppler and duplex ultrasound were performed to evaluate the lower extremity deep venous  systems from the level of the common femoral vein and including the common femoral, femoral, profunda femoral, popliteal and calf veins including the posterior tibial, peroneal and gastrocnemius veins when visible. The superficial great saphenous vein was also interrogated. Spectral Doppler was utilized to evaluate flow at rest and with distal augmentation maneuvers in the common femoral, femoral and popliteal veins.  COMPARISON:  11/12/2012  FINDINGS: RIGHT LOWER EXTREMITY  Common Femoral Vein: No evidence of thrombus. Normal compressibility, respiratory phasicity and response to augmentation.  Saphenofemoral Junction: No evidence of thrombus. Normal compressibility and flow on color Doppler imaging.  Profunda Femoral Vein: No evidence of thrombus. Normal compressibility and flow on color Doppler imaging.  Femoral Vein: No evidence of thrombus. Normal compressibility, respiratory phasicity and response to augmentation.  Popliteal Vein: No evidence of thrombus. Normal compressibility, respiratory phasicity and response to augmentation.  Calf Veins: No evidence of thrombus. Normal compressibility and flow on color Doppler imaging.  Superficial Great Saphenous Vein: No evidence of thrombus. Normal compressibility and flow on color Doppler imaging.  Venous Reflux:  None.  Other Findings:  None.  LEFT LOWER EXTREMITY  Common Femoral Vein: No evidence of thrombus. Normal compressibility, respiratory phasicity and response to augmentation.  Saphenofemoral Junction: No evidence of thrombus. Normal compressibility and flow on color Doppler imaging.  Profunda Femoral Vein: No evidence of thrombus. Normal compressibility and flow on color Doppler imaging.  Femoral Vein: No evidence of thrombus. Normal compressibility, respiratory phasicity and response to augmentation.  Popliteal Vein: No evidence of thrombus. Normal compressibility, respiratory phasicity and response to augmentation.  Calf Veins: No evidence of thrombus.  Normal compressibility and flow on color Doppler imaging.  Superficial Great Saphenous Vein: No evidence of thrombus. Normal compressibility and flow on color Doppler imaging.  Venous Reflux:  None.  Other Findings:  None.  IMPRESSION: No evidence of deep venous thrombosis.   Electronically Signed   By: Burman Nieves M.D.   On: 01/25/2015 23:08     EKG Interpretation   Date/Time:  Monday January 25 2015 20:42:50 EDT Ventricular Rate:  101 PR Interval:  278 QRS Duration: 86 QT Interval:  306 QTC Calculation: 396 R Axis:   89 Text Interpretation:  Sinus tachycardia with 1st degree A-V block Possible  Left atrial enlargement T wave abnormality, consider inferolateral  ischemia Abnormal ECG similar to prior EKG Confirmed by Mickaela Starlin  MD, Michon Kaczmarek  ([id number]) on 01/25/2015  8:51:20 PM      MDM   Final diagnoses:  ESRD (end stage renal disease)  Systolic congestive heart failure, unspecified congestive heart failure chronicity  Chest pain, unspecified chest pain type   Patient presents with chest pain and shortness of breath. She states it feels similar to her volume overload. Her oxygen saturations are 98-100% on my exam. There is one documented oxygen saturation of 90% however she had no hypoxia on my evaluation.  Given her history of VTE, I did do Doppler ultrasounds of her lower extremities which were negative for DVT.  She does have a large distended abdomen with a fluid wave which likely represents ascites. She has no pulmonary edema on chest x-ray or exam. Her potassium is normal. Her EKG shows some abnormalities but appears to be similar to her past EKGs. Her troponin is mildly elevated which could be resulting from her underlying renal failure. Given her ongoing chest pain and shortness of breath with abdominal distention I will consult the hospitalist for admission and transfer to [facility]. She will likely need dialysis tomorrow.  Spoke with Dr. Youlanda Roys who has accepted pt for transfer.  I  personally performed the services described in this documentation, which was scribed in my presence.  The recorded information has been reviewed and considered.      [name] Bucco, MD 01/26/15 [phone]

## 2015-01-26 ENCOUNTER — Encounter (HOSPITAL_COMMUNITY): Payer: Self-pay | Admitting: Nephrology

## 2015-01-26 DIAGNOSIS — E8779 Other fluid overload: Secondary | ICD-10-CM

## 2015-01-26 DIAGNOSIS — R112 Nausea with vomiting, unspecified: Secondary | ICD-10-CM | POA: Diagnosis present

## 2015-01-26 DIAGNOSIS — N186 End stage renal disease: Secondary | ICD-10-CM

## 2015-01-26 DIAGNOSIS — Z888 Allergy status to other drugs, medicaments and biological substances status: Secondary | ICD-10-CM | POA: Diagnosis not present

## 2015-01-26 DIAGNOSIS — R1084 Generalized abdominal pain: Secondary | ICD-10-CM | POA: Diagnosis not present

## 2015-01-26 DIAGNOSIS — R109 Unspecified abdominal pain: Secondary | ICD-10-CM

## 2015-01-26 DIAGNOSIS — R0602 Shortness of breath: Secondary | ICD-10-CM | POA: Diagnosis present

## 2015-01-26 DIAGNOSIS — I5022 Chronic systolic (congestive) heart failure: Secondary | ICD-10-CM

## 2015-01-26 DIAGNOSIS — I120 Hypertensive chronic kidney disease with stage 5 chronic kidney disease or end stage renal disease: Secondary | ICD-10-CM | POA: Diagnosis present

## 2015-01-26 DIAGNOSIS — Z940 Kidney transplant status: Secondary | ICD-10-CM | POA: Diagnosis not present

## 2015-01-26 DIAGNOSIS — D649 Anemia, unspecified: Secondary | ICD-10-CM | POA: Diagnosis present

## 2015-01-26 DIAGNOSIS — E8770 Fluid overload, unspecified: Secondary | ICD-10-CM | POA: Diagnosis present

## 2015-01-26 DIAGNOSIS — Z885 Allergy status to narcotic agent status: Secondary | ICD-10-CM | POA: Diagnosis not present

## 2015-01-26 DIAGNOSIS — Z95810 Presence of automatic (implantable) cardiac defibrillator: Secondary | ICD-10-CM | POA: Diagnosis not present

## 2015-01-26 DIAGNOSIS — Z992 Dependence on renal dialysis: Secondary | ICD-10-CM | POA: Diagnosis not present

## 2015-01-26 DIAGNOSIS — Q613 Polycystic kidney, unspecified: Secondary | ICD-10-CM | POA: Diagnosis not present

## 2015-01-26 DIAGNOSIS — R188 Other ascites: Secondary | ICD-10-CM | POA: Diagnosis present

## 2015-01-26 DIAGNOSIS — Z9119 Patient's noncompliance with other medical treatment and regimen: Secondary | ICD-10-CM | POA: Diagnosis present

## 2015-01-26 DIAGNOSIS — Z86711 Personal history of pulmonary embolism: Secondary | ICD-10-CM | POA: Diagnosis not present

## 2015-01-26 DIAGNOSIS — R1110 Vomiting, unspecified: Secondary | ICD-10-CM

## 2015-01-26 DIAGNOSIS — I429 Cardiomyopathy, unspecified: Secondary | ICD-10-CM | POA: Diagnosis present

## 2015-01-26 DIAGNOSIS — Z7901 Long term (current) use of anticoagulants: Secondary | ICD-10-CM | POA: Diagnosis not present

## 2015-01-26 DIAGNOSIS — Z87891 Personal history of nicotine dependence: Secondary | ICD-10-CM | POA: Diagnosis not present

## 2015-01-26 LAB — MRSA PCR SCREENING: MRSA by PCR: NEGATIVE

## 2015-01-26 LAB — TSH: TSH: 5.046 u[IU]/mL — ABNORMAL HIGH (ref 0.350–4.500)

## 2015-01-26 LAB — TROPONIN I
TROPONIN I: 0.3 ng/mL — AB (ref <0.031)
Troponin I: 0.3 ng/mL — ABNORMAL HIGH (ref <0.031)
Troponin I: 0.27 ng/mL — ABNORMAL HIGH (ref <0.031)

## 2015-01-26 LAB — PROTIME-INR
INR: 1.36 (ref 0.00–1.49)
Prothrombin Time: 16.9 seconds — ABNORMAL HIGH (ref 11.6–15.2)

## 2015-01-26 MED ORDER — RENA-VITE PO TABS
1.0000 | ORAL_TABLET | Freq: Every morning | ORAL | Status: DC
  Administered 2015-01-26: 1 via ORAL
  Filled 2015-01-26 (×2): qty 1

## 2015-01-26 MED ORDER — HYDROMORPHONE HCL 1 MG/ML IJ SOLN
1.0000 mg | Freq: Once | INTRAMUSCULAR | Status: AC
  Administered 2015-01-26: 1 mg via INTRAVENOUS
  Filled 2015-01-26: qty 1

## 2015-01-26 MED ORDER — SERTRALINE HCL 100 MG PO TABS
100.0000 mg | ORAL_TABLET | Freq: Every day | ORAL | Status: DC
Start: 2015-01-26 — End: 2015-01-29
  Administered 2015-01-26 – 2015-01-29 (×4): 100 mg via ORAL
  Filled 2015-01-26 (×4): qty 1

## 2015-01-26 MED ORDER — CARVEDILOL 6.25 MG PO TABS
6.2500 mg | ORAL_TABLET | Freq: Two times a day (BID) | ORAL | Status: DC
  Administered 2015-01-26 – 2015-01-29 (×5): 6.25 mg via ORAL
  Filled 2015-01-26 (×8): qty 1

## 2015-01-26 MED ORDER — RAMIPRIL 5 MG PO CAPS
5.0000 mg | ORAL_CAPSULE | Freq: Two times a day (BID) | ORAL | Status: DC
  Filled 2015-01-26 (×2): qty 1

## 2015-01-26 MED ORDER — SODIUM CHLORIDE 0.9 % IJ SOLN
3.0000 mL | Freq: Two times a day (BID) | INTRAMUSCULAR | Status: DC
  Administered 2015-01-26 – 2015-01-29 (×6): 3 mL via INTRAVENOUS

## 2015-01-26 MED ORDER — CALCIUM ACETATE 667 MG PO CAPS
1334.0000 mg | ORAL_CAPSULE | Freq: Three times a day (TID) | ORAL | Status: DC
  Filled 2015-01-26 (×3): qty 2

## 2015-01-26 MED ORDER — DARBEPOETIN ALFA 60 MCG/0.3ML IJ SOSY
60.0000 ug | PREFILLED_SYRINGE | INTRAMUSCULAR | Status: DC
Start: 2015-01-26 — End: 2015-01-29
  Administered 2015-01-26: 60 ug via INTRAVENOUS
  Filled 2015-01-26: qty 0.3

## 2015-01-26 MED ORDER — ISOSORBIDE DINITRATE 10 MG PO TABS
10.0000 mg | ORAL_TABLET | Freq: Three times a day (TID) | ORAL | Status: DC
  Administered 2015-01-26 – 2015-01-29 (×7): 10 mg via ORAL
  Filled 2015-01-26 (×12): qty 1

## 2015-01-26 MED ORDER — HEPARIN SODIUM (PORCINE) 5000 UNIT/ML IJ SOLN
5000.0000 [IU] | Freq: Three times a day (TID) | INTRAMUSCULAR | Status: DC
  Administered 2015-01-26 – 2015-01-29 (×7): 5000 [IU] via SUBCUTANEOUS
  Filled 2015-01-26 (×10): qty 1

## 2015-01-26 MED ORDER — LORAZEPAM 1 MG PO TABS: 1.0000 mg | ORAL_TABLET | ORAL | Status: DC

## 2015-01-26 MED ORDER — ACETAMINOPHEN 325 MG PO TABS
650.0000 mg | ORAL_TABLET | Freq: Four times a day (QID) | ORAL | Status: DC | PRN
  Administered 2015-01-27: 650 mg via ORAL
  Filled 2015-01-26 (×2): qty 2

## 2015-01-26 MED ORDER — ONDANSETRON HCL 4 MG/2ML IJ SOLN: 4.0000 mg | Freq: Four times a day (QID) | INTRAMUSCULAR | Status: DC | PRN

## 2015-01-26 MED ORDER — HYDROMORPHONE HCL 1 MG/ML IJ SOLN
1.0000 mg | INTRAMUSCULAR | Status: DC | PRN
  Administered 2015-01-26 – 2015-01-27 (×5): 1 mg via INTRAVENOUS
  Filled 2015-01-26 (×5): qty 1

## 2015-01-26 MED ORDER — ACETAMINOPHEN 650 MG RE SUPP: 650.0000 mg | Freq: Four times a day (QID) | RECTAL | Status: DC | PRN

## 2015-01-26 MED ORDER — DIPHENHYDRAMINE HCL 50 MG/ML IJ SOLN
12.5000 mg | Freq: Four times a day (QID) | INTRAMUSCULAR | Status: DC | PRN
  Administered 2015-01-26 – 2015-01-29 (×8): 12.5 mg via INTRAVENOUS
  Filled 2015-01-26 (×8): qty 1

## 2015-01-26 MED ORDER — OXYCODONE-ACETAMINOPHEN 5-325 MG PO TABS
1.0000 | ORAL_TABLET | Freq: Four times a day (QID) | ORAL | Status: DC | PRN
  Administered 2015-01-26: 1 via ORAL
  Filled 2015-01-26: qty 1

## 2015-01-26 MED ORDER — CALCIUM ACETATE 667 MG PO CAPS
2001.0000 mg | ORAL_CAPSULE | Freq: Three times a day (TID) | ORAL | Status: DC
  Administered 2015-01-26 – 2015-01-29 (×8): 2001 mg via ORAL
  Filled 2015-01-26 (×11): qty 3

## 2015-01-26 MED ORDER — DOXERCALCIFEROL 4 MCG/2ML IV SOLN
9.0000 ug | INTRAVENOUS | Status: DC
  Administered 2015-01-26 – 2015-01-29 (×2): 9 ug via INTRAVENOUS
  Filled 2015-01-26 (×2): qty 6

## 2015-01-26 MED ORDER — LISINOPRIL 2.5 MG PO TABS
2.5000 mg | ORAL_TABLET | Freq: Two times a day (BID) | ORAL | Status: DC
  Administered 2015-01-26 – 2015-01-29 (×5): 2.5 mg via ORAL
  Filled 2015-01-26 (×8): qty 1

## 2015-01-26 MED ORDER — ONDANSETRON HCL 4 MG PO TABS: 4.0000 mg | ORAL_TABLET | Freq: Four times a day (QID) | ORAL | Status: DC | PRN

## 2015-01-26 MED ORDER — DOXERCALCIFEROL 4 MCG/2ML IV SOLN
INTRAVENOUS | Status: AC
  Filled 2015-01-26: qty 6

## 2015-01-26 MED ORDER — DARBEPOETIN ALFA 60 MCG/0.3ML IJ SOSY
PREFILLED_SYRINGE | INTRAMUSCULAR | Status: AC
  Filled 2015-01-26: qty 0.3

## 2015-01-26 MED ORDER — FAMOTIDINE 20 MG PO TABS
20.0000 mg | ORAL_TABLET | Freq: Every day | ORAL | Status: DC | PRN
  Filled 2015-01-26: qty 1

## 2015-01-26 MED ORDER — DIPHENHYDRAMINE HCL 50 MG/ML IJ SOLN
25.0000 mg | Freq: Once | INTRAMUSCULAR | Status: AC
  Administered 2015-01-26: 25 mg via INTRAVENOUS
  Filled 2015-01-26: qty 1

## 2015-01-26 NOTE — ED Provider Notes (Signed)
Vitals remain stable. Still no beds available at [facility].  Pt's main issue is that she needs dialysis.  I will contact nephrology to see if we can possibly transfer to [facility] ED while awaiting a bed so she can get dialysis today.  Notifed Dr Mahala Menghini who will await Nephrology's decision.  Discussed with Dr Briant Cedar.  Pt can get dialysis today.  Pt will not be able to wait in the dialysis unit for a bed however.  Discussed with Dr Elesa Massed.  Pt ok to go to [facility] ED while awaiting a hospital bed so she can at least get dialysis in the interim.    Plan on transfer to [facility] ED.     Linwood Dibbles, MD 01/26/15 [phone]

## 2015-01-26 NOTE — Progress Notes (Signed)
New Admission Note:   Arrival Method: STRETCHER FROM ED  Mental Orientation: ALERT AND ORIENTED  Telemetry: AWAITING ORDERS Assessment: Completed Skin: INTACT IV: R ARM SALINE LOCKD  Pain: 8/10 CX  PAIN AWAITING ORDERS  Tubes: NONE  Safety Measures: Safety Fall Prevention Plan has been given, discussed and signed Admission: NOTIFIED ADMITTING RN  [address] Orientation: Patient has been orientated to the room, unit and staff.  Family: NONE AT BEDSIDE   Orders have been reviewed and implemented. Will continue to monitor the patient. Call light has been placed within reach and bed alarm has been activated.   Riesa Pope BSN, RN Phone number: [phone]

## 2015-01-26 NOTE — ED Notes (Signed)
Report given to [facility] with [facility]. ETA 15 minutes

## 2015-01-26 NOTE — Procedures (Signed)
Pt seen on HD.  Ap 110 Vp 230  BFR 400 She is up over 6kg but says her BP can't tolerate much more than 3L at one time due to her cardiomyopathy.  Will plan HD again in AM to get to [facility]

## 2015-01-26 NOTE — ED Notes (Addendum)
Pt requesting more pain medication at this time. Rates pain 8/10 in chest. EDP made aware.

## 2015-01-26 NOTE — H&P (Signed)
[facility] History and Physical  [name] TWK:[id number] DOB: Mar 19, 1991 DOA: 01/25/2015  Referring physician: EDP PCP: Ellin Saba   Chief Complaint: Shortness of breath and fluid overload  HPI: [name] Small is a 24 y.o. female with past medical history of PKD, ESRD on dialysis, has history of severe systolic CHF with ejection fraction of 20% (according to the patient last echo LVEF is 8%), ICD in place, history of PE in 2013. Patient currently lives in [place], she came in to visit her family in [place]. Last dialysis she had was Tuesday morning, patient said she supposed to go back to [place] but her car is broken and currently in a local car shop. She developed shortness of breath, and lower extremity edema. Patient also mentions she have some chest pain which is right-sided, increases with palpation. Patient has nausea, vomiting and diarrhea which she said she usually gets that. In the ED troponin slightly elevated at 0.27 and 0.3, the rest of the workup relatively normal except of elevated creatinine consistent with ESRD and anemia likely secondary to the CKD.  Review of Systems:  Constitutional: negative for anorexia, fevers and sweats Eyes: negative for irritation, redness and visual disturbance Ears, nose, mouth, throat, and face: negative for earaches, epistaxis, nasal congestion and sore throat Respiratory: negative for cough, dyspnea on exertion, sputum and wheezing CardPositive for SOB and trace to +1 lower extremity edema Gastrointestinal: has nausea and vomiting Genitourinary:negative for dysuria, frequency and hematuria Hematologic/lymphatic: negative for bleeding, easy bruising and lymphadenopathy Musculoskeletal:negative for arthralgias, muscle weakness and stiff joints Neurological: negative for coordination problems, gait problems, headaches and weakness Endocrine: negative for diabetic symptoms including polydipsia, polyuria and weight  loss Allergic/Immunologic: negative for anaphylaxis, hay fever and urticaria  Past Medical History  Diagnosis Date  . Hemodialysis patient   . Hypertension   . Pulmonary emboli 01/2012    Bilateral, moderate clot burden, areas of pulmonary infarction and central necrosis  . CHF (congestive heart failure)   . Cardiomyopathy   . Dysrhythmia     at times per pt.  . Anemia   . H/O transfusion of packed red blood cells   . 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 Polycystic kidney disease     [facility]  T,Th, S  . Cellulitis and abscess of face 03/22/2013   Past Surgical History  Procedure Laterality Date  . Nephrectomy    . Av fistula placement    . Kidney transplant  2008    failed  . Tonsillectomy      as a child.  . Adenoidectomy    . Incision and drainage abscess Right 03/21/2013    Procedure: INCISION AND DRAINAGE RIGHT CHEEK ABSCESS REMOVAL OF FOREIGN BODY;  Surgeon: Serena Colonel, MD;  Location: [facility] OR;  Service: ENT;  Laterality: Right;   Social History:   reports that she quit smoking about 2 years ago. She has never used smokeless tobacco. She reports that she does not drink alcohol or use illicit drugs.  Allergies  Allergen Reactions  . Tramadol Anaphylaxis  . Morphine And Related Itching    Ok with oxycodone  . Vicodin [Hydrocodone-Acetaminophen] Other (See Comments)    unknown    Family History  Problem Relation Age of Onset  . Polycystic kidney disease Father    Prior to Admission medications   Medication Sig Start Date End Date Taking? Authorizing Provider  amLODipine (NORVASC) 5 MG tablet Take 5 mg by mouth daily.   Yes Historical Provider, MD  calcium acetate (PHOSLO) 667 MG capsule Take 2 capsules (1,334 mg total) by mouth 3 (three) times daily with meals. Take  with snacks 05/01/13  Yes [name], MD  carvedilol (COREG) 6.25 MG tablet  Take 6.25 mg by mouth 2 (two) times daily. 01/08/13  Yes Historical Provider, MD  famotidine (PEPCID) 20 MG tablet Take 20 mg by mouth daily as needed. For heartburn 03/24/13  Yes Elease Etienne, MD  isosorbide dinitrate (ISORDIL) 10 MG tablet Take 10 mg by mouth 3 (three) times daily. 01/19/15 01/19/16 Yes Historical Provider, MD  lisinopril (PRINIVIL,ZESTRIL) 2.5 MG tablet Take 2.5 mg by mouth 2 (two) times daily. 01/19/15 01/19/16 Yes Historical Provider, MD  LORazepam (ATIVAN) 1 MG tablet Take 1 mg by mouth See admin instructions. On dialysis days, before dialysis 11/01/14  Yes Historical Provider, MD  multivitamin (RENA-VIT) TABS tablet Take 1 tablet by mouth every morning.   Yes Historical Provider, MD  ramipril (ALTACE) 5 MG capsule Take 5 mg by mouth 2 (two) times daily.   Yes Historical Provider, MD  sertraline (ZOLOFT) 100 MG tablet Take 100 mg by mouth daily.   Yes Historical Provider, MD   Physical Exam: Filed Vitals:   01/26/15 1100  BP: 133/94  Pulse: 98  Temp:   Resp: 14   Constitutional: Oriented to person, place, and time. Well-developed and well-nourished. Cooperative.  Head: Normocephalic and atraumatic.  Nose: Nose normal.  Mouth/Throat: Uvula is midline, oropharynx is clear and moist and mucous membranes are normal.  Eyes: Conjunctivae and EOM are normal. Pupils are equal, round, and reactive to light.  Neck: Trachea normal and normal range of motion. Neck supple.  Cardiovascular: Normal rate, regular rhythm, S1 normal, S2 normal, normal heart sounds and intact distal pulses.   Pulmonary/Chest: Effort normal and breath sounds normal.  Abdominal: Soft. Bowel sounds are normal. There is no hepatosplenomegaly. There is no tenderness.  Musculoskeletal: Normal range of motion.  Neurological: Alert and oriented to person, place, and time. Has normal strength. No cranial nerve deficit or sensory deficit.  Skin: Skin is warm, dry and intact.  Psychiatric: Has a normal mood and  affect. Speech is normal and behavior is normal.   Labs on Admission:  Basic Metabolic Panel:  Recent Labs Lab 01/25/15 2205  NA 136  K 4.9  CL 95*  CO2 22  GLUCOSE 93  BUN 95*  CREATININE 11.89*  CALCIUM 10.0   Liver Function Tests:  Recent Labs Lab 01/25/15 2205  AST 24  ALT 11  ALKPHOS 238*  BILITOT 0.3  PROT 8.1  ALBUMIN 4.0   No results for input(s): LIPASE, AMYLASE in the last 168 hours. No results for input(s): AMMONIA in the last 168 hours. CBC:  Recent Labs Lab 01/25/15 2205  WBC 8.1  NEUTROABS 5.3  HGB 10.4*  HCT 32.1*  MCV 91.2  PLT 225   Cardiac Enzymes:  Recent Labs Lab 01/25/15 2205 01/26/15 0625  TROPONINI 0.27* 0.30*    BNP (last 3 results) No results for input(s): BNP in the last 8760 hours.  ProBNP (last 3 results) No results for input(s): PROBNP in the last 8760 hours.  CBG: No results for input(s): GLUCAP in the last 168 hours.  Radiological Exams on Admission: Dg Chest 2 View  01/25/2015   CLINICAL DATA:  Chest pain  EXAM: CHEST  2 VIEW  COMPARISON:  08/30/2014  FINDINGS:  Chronic cardiopericardial enlargement. There is no edema, consolidation, effusion, or pneumothorax.  Single chamber ICD/ pacer from the right is in stable position compared to chest CT 12/20/2014.  Distal clavicle erosions consistent with renal osteodystrophy.  IMPRESSION: 1. Cardiomegaly without failure. 2. Renal osteodystrophy.   Electronically Signed   By: Marnee Spring M.D.   On: 01/25/2015 22:45   US Venous Img Lower Bilateral  01/25/2015   CLINICAL DATA:  Bilateral lower extremity swelling for 2 days. No dialysis for 6 days. History of previous pulmonary embolus.  EXAM: BILATERAL LOWER EXTREMITY VENOUS DOPPLER ULTRASOUND  TECHNIQUE: Gray-scale sonography with graded compression, as well as color Doppler and duplex ultrasound were performed to evaluate the lower extremity deep venous systems from the level of the common femoral vein and including the common  femoral, femoral, profunda femoral, popliteal and calf veins including the posterior tibial, peroneal and gastrocnemius veins when visible. The superficial great saphenous vein was also interrogated. Spectral Doppler was utilized to evaluate flow at rest and with distal augmentation maneuvers in the common femoral, femoral and popliteal veins.  COMPARISON:  11/12/2012  FINDINGS: RIGHT LOWER EXTREMITY  Common Femoral Vein: No evidence of thrombus. Normal compressibility, respiratory phasicity and response to augmentation.  Saphenofemoral Junction: No evidence of thrombus. Normal compressibility and flow on color Doppler imaging.  Profunda Femoral Vein: No evidence of thrombus. Normal compressibility and flow on color Doppler imaging.  Femoral Vein: No evidence of thrombus. Normal compressibility, respiratory phasicity and response to augmentation.  Popliteal Vein: No evidence of thrombus. Normal compressibility, respiratory phasicity and response to augmentation.  Calf Veins: No evidence of thrombus. Normal compressibility and flow on color Doppler imaging.  Superficial Great Saphenous Vein: No evidence of thrombus. Normal compressibility and flow on color Doppler imaging.  Venous Reflux:  None.  Other Findings:  None.  LEFT LOWER EXTREMITY  Common Femoral Vein: No evidence of thrombus. Normal compressibility, respiratory phasicity and response to augmentation.  Saphenofemoral Junction: No evidence of thrombus. Normal compressibility and flow on color Doppler imaging.  Profunda Femoral Vein: No evidence of thrombus. Normal compressibility and flow on color Doppler imaging.  Femoral Vein: No evidence of thrombus. Normal compressibility, respiratory phasicity and response to augmentation.  Popliteal Vein: No evidence of thrombus. Normal compressibility, respiratory phasicity and response to augmentation.  Calf Veins: No evidence of thrombus. Normal compressibility and flow on color Doppler imaging.  Superficial Great  Saphenous Vein: No evidence of thrombus. Normal compressibility and flow on color Doppler imaging.  Venous Reflux:  None.  Other Findings:  None.  IMPRESSION: No evidence of deep venous thrombosis.   Electronically Signed   By: Burman Nieves M.D.   On: 01/25/2015 23:08    EKG: Independently reviewed. showed first-degree AV block.  Assessment/Plan Principal Problem:   Fluid overload Active Problems:   ESRD (end stage renal disease)   Abdominal pain   Systolic CHF, chronic   Nausea with vomiting   Ascites    Fluid overload Patient came into the hospital with shortness of breath, mild chest pain likely secondary to fluid overload. Patient missed her dialysis sessions since Tuesday. Has also lower extremity edema and probable ascites. Nephrology consulted for hemodialysis.  Nausea and vomiting Patient reported nausea, vomiting and diarrhea. This is not unusual for her when she gets fluid overloaded. Patient does not have leukocytosis or fever. Likely not gastroenteritis. Follow clinically, she needs any workup right now, if continues will get stool studies. Treat symptomatically with antiemetics.  Abdominal ascites  Patient mentioned for the past month she had to have paracentesis  every other week. Reported the ascites being worked up before and likely secondary to severe CHF. Patient follows with her PCP in [place], no further workup.  Severe systolic CHF Last 2-D echo in the system was May 2013 showed LVEF of 20%, patient reported her LVEF to be 8% in February 2016. Continue home medications, continue Coreg, lisinopril and Isordil. Please note that patient is on lisinopril and ramipril, on discharge please discontinue one of the ACE inhibitors.  Chest pain Mentioned vague chest pain next to her ICD pocket, pain increases with the palpation. Patient had history of PE, it's unlikely here as she is not hypoxic or tachycardic. She has slight elevation of troponin of 0.3, I  will cycle 3 sets of cardiac enzymes to rule out ACS. Cardiac enzyme elevation is likely secondary to CHF in the setting of ESRD.  Code Status: Full code Family Communication:  plan discussed with the patient Disposition Plan: telemetry and inpatient.   Time spent: 70 minutes  Mariadelaluz Guggenheim A, MD [facility] Pager [phone]

## 2015-01-26 NOTE — Progress Notes (Signed)
Received report from ED RN Marcelino Duster at 11:23 am.   Ok Anis, RN 01/26/2015 11:28 AM

## 2015-01-26 NOTE — ED Notes (Signed)
Admission team at bedside.

## 2015-01-26 NOTE — ED Provider Notes (Signed)
Assumed care of pt from Dr. Fredderick Phenix.  Pt to be admitted, however beds full.  No change during my care, troponin stable.  Pt care to Dr. Lynelle Doctor pending.  Mirian Mo, MD 01/26/15 [phone]

## 2015-01-26 NOTE — ED Notes (Addendum)
Arrives to E 39 at [facility] at 1033. In NAD, reports still has chest pain in right upper chest; requesting more Dilaudid at this time. VSS.

## 2015-01-26 NOTE — ED Notes (Signed)
Hospitalist leaving bedside; notified of room assignment. They state to take her to room to wait for nephrology to see her for dialysis orders.

## 2015-01-26 NOTE — ED Notes (Signed)
Pt has left with [facility] at this time.

## 2015-01-26 NOTE — ED Notes (Signed)
Attempting to get the patient to [facility] at this time to have the patient dialyzed.

## 2015-01-26 NOTE — Consult Note (Signed)
[name] Small is an 24 y.o. female referred by Dr Arthor Captain   Chief Complaint: ESRD, Anemia, Sec HPTH HPI: 23yo BF with ESRD sec PCKD with long hx noncompliance and currently on HD in [place] TTS.  She came to [place] last Tuesday after HD to visit parents and car broke down. Thus she was unable to return to [place] and today presented to ER CO mild SOB and Rt side pleuritic CP.  Has severe cardiomyopathy with EF 5-10% (per records from her Dx unit) and had a defibrillator placed last month.  Has issues with ascites related to her cardiomyopathy requiring paracentesis.  Past Medical History  Diagnosis Date  . Hemodialysis patient   . Hypertension   . Pulmonary emboli 01/2012    Bilateral, moderate clot burden, areas of pulmonary infarction and central necrosis  . CHF (congestive heart failure)   . Cardiomyopathy   . Dysrhythmia     at times per pt.  . Anemia   . H/O transfusion of packed red blood cells   . 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 Polycystic kidney disease     [facility]  T,Th, S  . Cellulitis and abscess of face 03/22/2013    Past Surgical History  Procedure Laterality Date  . Nephrectomy    . Av fistula placement    . Kidney transplant  2008    failed  . Tonsillectomy      as a child.  . Adenoidectomy    . Incision and drainage abscess Right 03/21/2013    Procedure: INCISION AND DRAINAGE RIGHT CHEEK ABSCESS REMOVAL OF FOREIGN BODY;  Surgeon: Serena Colonel, MD;  Location: [facility] OR;  Service: ENT;  Laterality: Right;    Family History  Problem Relation Age of Onset  . Polycystic kidney disease Father    Social History:  reports that she quit smoking about 2 years ago. She has never used smokeless tobacco. She reports that she does not drink alcohol or use illicit drugs.  [place]  Allergies:  Allergies  Allergen Reactions  . Tramadol  Anaphylaxis  . Morphine And Related Itching    Ok with oxycodone  . Vicodin [Hydrocodone-Acetaminophen] Other (See Comments)    unknown    Medications Prior to Admission  Medication Sig Dispense Refill  . amLODipine (NORVASC) 5 MG tablet Take 5 mg by mouth daily.    . calcium acetate (PHOSLO) 667 MG capsule Take 2 capsules (1,334 mg total) by mouth 3 (three) times daily with meals. Take  with snacks 120 capsule 2  . carvedilol (COREG) 6.25 MG tablet Take 6.25 mg by mouth 2 (two) times daily.    . famotidine (PEPCID) 20 MG tablet Take 20 mg by mouth daily as needed. For heartburn    . isosorbide dinitrate (ISORDIL) 10 MG tablet Take 10 mg by mouth 3 (three) times daily.    Marland Kitchen lisinopril (PRINIVIL,ZESTRIL) 2.5 MG tablet Take 2.5 mg by mouth 2 (two) times daily.    Marland Kitchen LORazepam (ATIVAN) 1 MG tablet Take 1 mg by mouth See admin instructions. On dialysis days, before dialysis  0  . multivitamin (RENA-VIT) TABS tablet Take 1 tablet by mouth every morning.    . ramipril (ALTACE) 5 MG capsule Take 5 mg by mouth 2 (two) times daily.    . sertraline (ZOLOFT) 100 MG tablet Take 100 mg by mouth daily.  Lab Results: UA: ND   Recent Labs  01/25/15 2205  WBC 8.1  HGB 10.4*  HCT 32.1*  PLT 225   BMET  Recent Labs  01/25/15 2205  NA 136  K 4.9  CL 95*  CO2 22  GLUCOSE 93  BUN 95*  CREATININE 11.89*  CALCIUM 10.0   LFT  Recent Labs  01/25/15 2205  PROT 8.1  ALBUMIN 4.0  AST 24  ALT 11  ALKPHOS 238*  BILITOT 0.3   Dg Chest 2 View  01/25/2015   CLINICAL DATA:  Chest pain  EXAM: CHEST  2 VIEW  COMPARISON:  08/30/2014  FINDINGS: Chronic cardiopericardial enlargement. There is no edema, consolidation, effusion, or pneumothorax.  Single chamber ICD/ pacer from the right is in stable position compared to chest CT 12/20/2014.  Distal clavicle erosions consistent with renal osteodystrophy.  IMPRESSION: 1. Cardiomegaly without failure. 2. Renal osteodystrophy.   Electronically  Signed   By: Marnee Spring M.D.   On: 01/25/2015 22:45   US Venous Img Lower Bilateral  01/25/2015   CLINICAL DATA:  Bilateral lower extremity swelling for 2 days. No dialysis for 6 days. History of previous pulmonary embolus.  EXAM: BILATERAL LOWER EXTREMITY VENOUS DOPPLER ULTRASOUND  TECHNIQUE: Gray-scale sonography with graded compression, as well as color Doppler and duplex ultrasound were performed to evaluate the lower extremity deep venous systems from the level of the common femoral vein and including the common femoral, femoral, profunda femoral, popliteal and calf veins including the posterior tibial, peroneal and gastrocnemius veins when visible. The superficial great saphenous vein was also interrogated. Spectral Doppler was utilized to evaluate flow at rest and with distal augmentation maneuvers in the common femoral, femoral and popliteal veins.  COMPARISON:  11/12/2012  FINDINGS: RIGHT LOWER EXTREMITY  Common Femoral Vein: No evidence of thrombus. Normal compressibility, respiratory phasicity and response to augmentation.  Saphenofemoral Junction: No evidence of thrombus. Normal compressibility and flow on color Doppler imaging.  Profunda Femoral Vein: No evidence of thrombus. Normal compressibility and flow on color Doppler imaging.  Femoral Vein: No evidence of thrombus. Normal compressibility, respiratory phasicity and response to augmentation.  Popliteal Vein: No evidence of thrombus. Normal compressibility, respiratory phasicity and response to augmentation.  Calf Veins: No evidence of thrombus. Normal compressibility and flow on color Doppler imaging.  Superficial Great Saphenous Vein: No evidence of thrombus. Normal compressibility and flow on color Doppler imaging.  Venous Reflux:  None.  Other Findings:  None.  LEFT LOWER EXTREMITY  Common Femoral Vein: No evidence of thrombus. Normal compressibility, respiratory phasicity and response to augmentation.  Saphenofemoral Junction: No  evidence of thrombus. Normal compressibility and flow on color Doppler imaging.  Profunda Femoral Vein: No evidence of thrombus. Normal compressibility and flow on color Doppler imaging.  Femoral Vein: No evidence of thrombus. Normal compressibility, respiratory phasicity and response to augmentation.  Popliteal Vein: No evidence of thrombus. Normal compressibility, respiratory phasicity and response to augmentation.  Calf Veins: No evidence of thrombus. Normal compressibility and flow on color Doppler imaging.  Superficial Great Saphenous Vein: No evidence of thrombus. Normal compressibility and flow on color Doppler imaging.  Venous Reflux:  None.  Other Findings:  None.  IMPRESSION: No evidence of deep venous thrombosis.   Electronically Signed   By: Burman Nieves M.D.   On: 01/25/2015 23:08    ROS: No Change in vision Mild SOB RT sided, sharp CP over her defibrillator No ABD pain Chronic pain hips and knees No new neuropathic  Sxs  PHYSICAL EXAM: Blood pressure 143/101, pulse 98, temperature 98 F (36.7 C), temperature source Oral, resp. rate 17, height  (1.6 m), weight 58.968 kg (130 lb), last menstrual period 08/23/2014, SpO2 98 %. HEENT: PERRLA EOMI NECK:No JVD Chest: defibrillator Rt upper chest LUNGS:Feew faint basilar crackles CARDIAC:RRR wo MRG ABD:+ BS NT, + ascites EXT:0-tr edema.  LUA AVF + bruit NEURO:CNI Ox3 no asterixis  [facility] [place] in [place]  TTS, BFR 450, 3hr60min, DW 54.5KG, stand hep, hect , EPO 9500u  Assessment: 1. ESRD and noncompliance with HD 2. Severe cardiomyopathy 3. Anemia 4. Sec HPTH.  Records show sensipar but she says she is not taking  PLAN: 1. Plan HD today 2. According to records from her HD unit, she is only on lisinopril and not ramapril 3. Cont ESA 4. Cont hectorol   Macoy Rodwell T 01/26/2015, 12:24 PM

## 2015-01-26 NOTE — ED Notes (Signed)
Pt sleeping at this time. NAD noted. Call bell within reach.

## 2015-01-27 DIAGNOSIS — E8770 Fluid overload, unspecified: Principal | ICD-10-CM

## 2015-01-27 LAB — RENAL FUNCTION PANEL
ALBUMIN: 3.2 g/dL — AB (ref 3.5–5.2)
Anion gap: 14 (ref 5–15)
BUN: 53 mg/dL — AB (ref 6–23)
CALCIUM: 9.7 mg/dL (ref 8.4–10.5)
CO2: 25 mmol/L (ref 19–32)
Chloride: 96 mmol/L (ref 96–112)
Creatinine, Ser: 8.65 mg/dL — ABNORMAL HIGH (ref 0.50–1.10)
GFR calc Af Amer: 7 mL/min — ABNORMAL LOW (ref >90)
GFR calc non Af Amer: 6 mL/min — ABNORMAL LOW (ref >90)
GLUCOSE: 101 mg/dL — AB (ref 70–99)
Phosphorus: 7.7 mg/dL — ABNORMAL HIGH (ref 2.3–4.6)
Potassium: 4.9 mmol/L (ref 3.5–5.1)
Sodium: 135 mmol/L (ref 135–145)

## 2015-01-27 LAB — BASIC METABOLIC PANEL
Anion gap: 18 — ABNORMAL HIGH (ref 5–15)
BUN: 43 mg/dL — ABNORMAL HIGH (ref 6–23)
CO2: 23 mmol/L (ref 19–32)
Calcium: 10.2 mg/dL (ref 8.4–10.5)
Chloride: 96 mmol/L (ref 96–112)
Creatinine, Ser: 7.24 mg/dL — ABNORMAL HIGH (ref 0.50–1.10)
GFR calc Af Amer: 8 mL/min — ABNORMAL LOW (ref >90)
GFR calc non Af Amer: 7 mL/min — ABNORMAL LOW (ref >90)
GLUCOSE: 74 mg/dL (ref 70–99)
POTASSIUM: 4.4 mmol/L (ref 3.5–5.1)
Sodium: 137 mmol/L (ref 135–145)

## 2015-01-27 LAB — CBC
HEMATOCRIT: 34 % — AB (ref 36.0–46.0)
Hemoglobin: 10.9 g/dL — ABNORMAL LOW (ref 12.0–15.0)
MCH: 29.4 pg (ref 26.0–34.0)
MCHC: 32.1 g/dL (ref 30.0–36.0)
MCV: 91.6 fL (ref 78.0–100.0)
Platelets: 198 10*3/uL (ref 150–400)
RBC: 3.71 MIL/uL — ABNORMAL LOW (ref 3.87–5.11)
RDW: 18.2 % — ABNORMAL HIGH (ref 11.5–15.5)
WBC: 6.8 10*3/uL (ref 4.0–10.5)

## 2015-01-27 LAB — TROPONIN I: Troponin I: 0.26 ng/mL — ABNORMAL HIGH (ref <0.031)

## 2015-01-27 MED ORDER — OXYCODONE-ACETAMINOPHEN 5-325 MG PO TABS
1.0000 | ORAL_TABLET | ORAL | Status: DC | PRN
  Administered 2015-01-27 – 2015-01-29 (×10): 1 via ORAL
  Filled 2015-01-27 (×8): qty 1

## 2015-01-27 MED ORDER — LORAZEPAM 1 MG PO TABS
1.0000 mg | ORAL_TABLET | ORAL | Status: DC
  Administered 2015-01-29: 1 mg via ORAL
  Filled 2015-01-27: qty 1

## 2015-01-27 MED ORDER — DIPHENHYDRAMINE HCL 50 MG/ML IJ SOLN
INTRAMUSCULAR | Status: AC
  Filled 2015-01-27: qty 1

## 2015-01-27 MED ORDER — OXYCODONE-ACETAMINOPHEN 5-325 MG PO TABS
ORAL_TABLET | ORAL | Status: AC
  Administered 2015-01-27: 1 via ORAL
  Filled 2015-01-27: qty 1

## 2015-01-27 NOTE — Progress Notes (Signed)
[facility] PROGRESS NOTE  [name] Small:[id number] DOB: 1991-01-17 DOA: 01/25/2015 PCP: Ellin Saba  Assessment/Plan:  Principal Problem:   Fluid overload:  Secondary to not getting dialysis for a week.  Will get dialysis again today. Active Problems:   ESRD (end stage renal disease):  Patient is requesting to have outpatient dialysis arranged in [facility] for about a month until she has enough money to get her car fixed. Have discussed with care management.   Abdominal pain secondary to fluid overload. No evidence of infection or gastroenteritis. Patient reports she vomited today, but nurses report that she ate breakfast and had no witnessed emesis. Monitor for now.   Systolic CHF, chronic:  On lisinopril, not ramipril   Nausea with vomiting   Ascites Chest pain: Troponin elevated likely secondary to renal failure and heart failure. Trend is flat. Pain is atypical for ACS. No further workup needed.  Code Status:  full Family Communication:   Disposition Plan:    Consultants:  Nephrology  Procedures:     Antibiotics:    HPI/Subjective: Feels a little better. Still having chest pain and abdominal pain and bloating. A breakfast but reports emesis. No reported diarrhea.  Objective: Filed Vitals:   01/27/15 0500  BP: 131/92  Pulse: 90  Temp: 98 F (36.7 C)  Resp: 16    Intake/Output Summary (Last 24 hours) at 01/27/15 1101 Last data filed at 01/27/15 0600  Gross per 24 hour  Intake    840 ml  Output   3000 ml  Net  -2160 ml   Filed Weights   01/25/15 2049 01/26/15 1410 01/26/15 1806  Weight: 58.968 kg (130 lb) 61.7 kg (136 lb 0.4 oz) 57.4 kg (126 lb 8.7 oz)    Exam:   General:  Appears comfortable. Lying flat and watching TV.  Cardiovascular: Regular rate rhythm without murmurs, gallops rubs  Respiratory: Clear to auscultation bilaterally without wheezes rhonchi or rales  Abdomen: Soft. Bowel sounds present. Slightly protuberant.  Ext:  Trace edema.   Basic Metabolic Panel:  Recent Labs Lab 01/25/15 2205 01/26/15 2325  NA 136 137  K 4.9 4.4  CL 95* 96  CO2 22 23  GLUCOSE 93 74  BUN 95* 43*  CREATININE 11.89* 7.24*  CALCIUM 10.0 10.2   Liver Function Tests:  Recent Labs Lab 01/25/15 2205  AST 24  ALT 11  ALKPHOS 238*  BILITOT 0.3  PROT 8.1  ALBUMIN 4.0   No results for input(s): LIPASE, AMYLASE in the last 168 hours. No results for input(s): AMMONIA in the last 168 hours. CBC:  Recent Labs Lab 01/25/15 2205 01/26/15 2325  WBC 8.1 6.8  NEUTROABS 5.3  --   HGB 10.4* 10.9*  HCT 32.1* 34.0*  MCV 91.2 91.6  PLT 225 198   Cardiac Enzymes:  Recent Labs Lab 01/25/15 2205 01/26/15 0625 01/26/15 1240 01/26/15 2024 01/26/15 2325  TROPONINI 0.27* 0.30* 0.30* 0.27* 0.26*   BNP (last 3 results) No results for input(s): BNP in the last 8760 hours.  ProBNP (last 3 results) No results for input(s): PROBNP in the last 8760 hours.  CBG: No results for input(s): GLUCAP in the last 168 hours.  Recent Results (from the past 240 hour(s))  MRSA PCR Screening     Status: None   Collection Time: 01/26/15 12:25 PM  Result Value Ref Range Status   MRSA by PCR NEGATIVE NEGATIVE Final    Comment:        The GeneXpert MRSA Assay (FDA  approved for NASAL specimens only), is one component of a comprehensive MRSA colonization surveillance program. It is not intended to diagnose MRSA infection nor to guide or monitor treatment for MRSA infections.      Studies: Dg Chest 2 View  01/25/2015   CLINICAL DATA:  Chest pain  EXAM: CHEST  2 VIEW  COMPARISON:  08/30/2014  FINDINGS: Chronic cardiopericardial enlargement. There is no edema, consolidation, effusion, or pneumothorax.  Single chamber ICD/ pacer from the right is in stable position compared to chest CT 12/20/2014.  Distal clavicle erosions consistent with renal osteodystrophy.  IMPRESSION: 1. Cardiomegaly without failure. 2. Renal osteodystrophy.    Electronically Signed   By: Marnee Spring M.D.   On: 01/25/2015 22:45   US Venous Img Lower Bilateral  01/25/2015   CLINICAL DATA:  Bilateral lower extremity swelling for 2 days. No dialysis for 6 days. History of previous pulmonary embolus.  EXAM: BILATERAL LOWER EXTREMITY VENOUS DOPPLER ULTRASOUND  TECHNIQUE: Gray-scale sonography with graded compression, as well as color Doppler and duplex ultrasound were performed to evaluate the lower extremity deep venous systems from the level of the common femoral vein and including the common femoral, femoral, profunda femoral, popliteal and calf veins including the posterior tibial, peroneal and gastrocnemius veins when visible. The superficial great saphenous vein was also interrogated. Spectral Doppler was utilized to evaluate flow at rest and with distal augmentation maneuvers in the common femoral, femoral and popliteal veins.  COMPARISON:  11/12/2012  FINDINGS: RIGHT LOWER EXTREMITY  Common Femoral Vein: No evidence of thrombus. Normal compressibility, respiratory phasicity and response to augmentation.  Saphenofemoral Junction: No evidence of thrombus. Normal compressibility and flow on color Doppler imaging.  Profunda Femoral Vein: No evidence of thrombus. Normal compressibility and flow on color Doppler imaging.  Femoral Vein: No evidence of thrombus. Normal compressibility, respiratory phasicity and response to augmentation.  Popliteal Vein: No evidence of thrombus. Normal compressibility, respiratory phasicity and response to augmentation.  Calf Veins: No evidence of thrombus. Normal compressibility and flow on color Doppler imaging.  Superficial Great Saphenous Vein: No evidence of thrombus. Normal compressibility and flow on color Doppler imaging.  Venous Reflux:  None.  Other Findings:  None.  LEFT LOWER EXTREMITY  Common Femoral Vein: No evidence of thrombus. Normal compressibility, respiratory phasicity and response to augmentation.  Saphenofemoral  Junction: No evidence of thrombus. Normal compressibility and flow on color Doppler imaging.  Profunda Femoral Vein: No evidence of thrombus. Normal compressibility and flow on color Doppler imaging.  Femoral Vein: No evidence of thrombus. Normal compressibility, respiratory phasicity and response to augmentation.  Popliteal Vein: No evidence of thrombus. Normal compressibility, respiratory phasicity and response to augmentation.  Calf Veins: No evidence of thrombus. Normal compressibility and flow on color Doppler imaging.  Superficial Great Saphenous Vein: No evidence of thrombus. Normal compressibility and flow on color Doppler imaging.  Venous Reflux:  None.  Other Findings:  None.  IMPRESSION: No evidence of deep venous thrombosis.   Electronically Signed   By: Burman Nieves M.D.   On: 01/25/2015 23:08    Scheduled Meds: . calcium acetate  2,001 mg Oral TID WC  . carvedilol  6.25 mg Oral BID  . Darbepoetin Alfa  60 mcg Intravenous Q Tue-HD  . doxercalciferol  9 mcg Intravenous Q T,Th,Sa-HD  . heparin  5,000 Units Subcutaneous 3 times per day  . isosorbide dinitrate  10 mg Oral TID  . lisinopril  2.5 mg Oral BID  . LORazepam  1 mg  Oral Once per day on Mon Wed Fri  . multivitamin  1 tablet Oral q morning - 10a  . sertraline  100 mg Oral Daily  . sodium chloride  3 mL Intravenous Q12H   Continuous Infusions:   Time spent: 35 minutes  Makalah Asberry L  [facility] [email or web address], password [facility] 01/27/2015, 11:01 AM  LOS: 1 day

## 2015-01-27 NOTE — Care Management Note (Addendum)
CARE MANAGEMENT NOTE 01/27/2015  Patient:  [name] Small,[name] Small   Account Number:  [id number]  Date Initiated:  01/27/2015  Documentation initiated by:  Jaylani Mcguinn  Subjective/Objective Assessment:   CM following for progression and d/c planning.     Action/Plan:   01/27/15 Met with pt to discuss d/c needs, per pt she is unable to return to [place] due to her car needing repair. This pt states that she lives in [place] with her BF and plans to return there at the end of the month, req HD here.   Anticipated DC Date:  02/01/2015   Anticipated DC Plan:  HOME/SELF CARE         Choice offered to / List presented to:             Status of service:  In process, will continue to follow Medicare Important Message given?   (If response is "NO", the following Medicare IM given date fields will be blank) Date Medicare IM given:   Medicare IM given by:   Date Additional Medicare IM given:   Additional Medicare IM given by:    Discharge Disposition:    Per UR Regulation:    If discussed at Long Length of Stay Meetings, dates discussed:    Comments:  01/27/2015 Met with pt who states that she can afford to have her car repaired at the end of the month but must stay with her parents in [place] for now. She is requesting HD until the end of the month. This CM informed the pt that we must talk with the MD and with HD @ [facility]. 11:38 This CM just informed that this pt was d/c from [facility] due to noncompliance and will not be accepted at local [facility]. Per HD center , [facility] , she will begin working of possibe adm to [facility] in the [place] area. Will continue to follow.  CRoyal RN MPH , [phone]

## 2015-01-27 NOTE — Procedures (Signed)
Pt seen on HD.  AP 160  Vp 250.  BFR 450.  Trying for 3.5 L.   She now says she plans to stay in [place] for a month and now needs a transient spot.  SW will need to find a unit in [facility] and she has been DC'd from our practice due to non compliance.

## 2015-01-28 DIAGNOSIS — R1084 Generalized abdominal pain: Secondary | ICD-10-CM

## 2015-01-28 LAB — RENAL FUNCTION PANEL
ALBUMIN: 3 g/dL — AB (ref 3.5–5.2)
ANION GAP: 15 (ref 5–15)
BUN: 31 mg/dL — AB (ref 6–23)
CALCIUM: 9.7 mg/dL (ref 8.4–10.5)
CHLORIDE: 102 mmol/L (ref 96–112)
CO2: 22 mmol/L (ref 19–32)
CREATININE: 6.07 mg/dL — AB (ref 0.50–1.10)
GFR, EST AFRICAN AMERICAN: 10 mL/min — AB (ref >90)
GFR, EST NON AFRICAN AMERICAN: 9 mL/min — AB (ref >90)
GLUCOSE: 77 mg/dL (ref 70–99)
PHOSPHORUS: 6.3 mg/dL — AB (ref 2.3–4.6)
Potassium: 3.9 mmol/L (ref 3.5–5.1)
Sodium: 139 mmol/L (ref 135–145)

## 2015-01-28 LAB — HEMOGLOBIN A1C
Hgb A1c MFr Bld: 5.7 % — ABNORMAL HIGH (ref 4.8–5.6)
MEAN PLASMA GLUCOSE: 117 mg/dL

## 2015-01-28 MED ORDER — OXYCODONE HCL 5 MG PO TABS
5.0000 mg | ORAL_TABLET | Freq: Once | ORAL | Status: AC
  Administered 2015-01-28: 5 mg via ORAL
  Filled 2015-01-28: qty 1

## 2015-01-28 NOTE — Progress Notes (Signed)
[facility] PROGRESS NOTE  [name] Small ZOX:[id number] DOB: [birth year] DOA: 01/25/2015 PCP: Ellin Saba  Assessment/Plan:  Principal Problem:   Fluid overload:  Secondary to not getting dialysis for a week.  Improved. Trying to get outpt HD arranged in [place] till the end of the month.  (fired by [employer]) Active Problems:   ESRD (end stage renal disease):    Abdominal pain secondary to fluid overload. Appears comfortable. Per RN, no N/V/D   Systolic CHF, chronic:  On lisinopril, not ramipril   Nausea with vomiting   Ascites secondary to noncompliance with HD.  Chest pain: Troponin elevated likely secondary to renal failure and heart failure. Trend is flat. Pain is atypical for ACS. No further workup needed.  Code Status:  full Family Communication:   Disposition Plan:    Consultants:  Nephrology  Procedures:     Antibiotics:    HPI/Subjective: No complaints  Objective: Filed Vitals:   01/28/15 1104  BP: 120/77  Pulse:   Temp:   Resp:     Intake/Output Summary (Last 24 hours) at 01/28/15 1358 Last data filed at 01/28/15 0900  Gross per 24 hour  Intake    240 ml  Output   3500 ml  Net  -3260 ml   Filed Weights   01/26/15 1806 01/27/15 1410 01/27/15 1752  Weight: 57.4 kg (126 lb 8.7 oz) 58.1 kg (128 lb 1.4 oz) 55.5 kg (122 lb 5.7 oz)    Exam:   General:  Sitting at edge of bed texting. Won't say much  Cardiovascular: Regular rate rhythm without murmurs, gallops rubs  Respiratory: Clear to auscultation bilaterally without wheezes rhonchi or rales  Abdomen: Soft. Bowel sounds present. Slightly protuberant.  Ext: Trace edema.   Basic Metabolic Panel:  Recent Labs Lab 01/25/15 2205 01/26/15 2325 01/27/15 1436 01/28/15 0640  NA 136 137 135 139  K 4.9 4.4 4.9 3.9  CL 95* 96 96 102  CO2 GLUCOSE 93 74 101* 77  BUN 95* 43* 53* 31*  CREATININE 11.89* 7.24* 8.65* 6.07*  CALCIUM 10.0 10.2 9.7 9.7  PHOS  --   --  7.7* 6.3*    Liver Function Tests:  Recent Labs Lab 01/25/15 2205 01/27/15 1436 01/28/15 0640  AST 24  --   --   ALT 11  --   --   ALKPHOS 238*  --   --   BILITOT 0.3  --   --   PROT 8.1  --   --   ALBUMIN 4.0 3.2* 3.0*   No results for input(s): LIPASE, AMYLASE in the last 168 hours. No results for input(s): AMMONIA in the last 168 hours. CBC:  Recent Labs Lab 01/25/15 2205 01/26/15 2325  WBC 8.1 6.8  NEUTROABS 5.3  --   HGB 10.4* 10.9*  HCT 32.1* 34.0*  MCV 91.2 91.6  PLT 225 198   Cardiac Enzymes:  Recent Labs Lab 01/25/15 2205 01/26/15 0625 01/26/15 1240 01/26/15 2024 01/26/15 2325  TROPONINI 0.27* 0.30* 0.30* 0.27* 0.26*   BNP (last 3 results) No results for input(s): BNP in the last 8760 hours.  ProBNP (last 3 results) No results for input(s): PROBNP in the last 8760 hours.  CBG: No results for input(s): GLUCAP in the last 168 hours.  Recent Results (from the past 240 hour(s))  MRSA PCR Screening     Status: None   Collection Time: 01/26/15 12:25 PM  Result Value Ref Range Status   MRSA by PCR  NEGATIVE NEGATIVE Final    Comment:        The GeneXpert MRSA Assay (FDA approved for NASAL specimens only), is one component of a comprehensive MRSA colonization surveillance program. It is not intended to diagnose MRSA infection nor to guide or monitor treatment for MRSA infections.      Studies: No results found.  Scheduled Meds: . calcium acetate  2,001 mg Oral TID WC  . carvedilol  6.25 mg Oral BID  . Darbepoetin Alfa  60 mcg Intravenous Q Tue-HD  . doxercalciferol  9 mcg Intravenous Q T,Th,Sa-HD  . heparin  5,000 Units Subcutaneous 3 times per day  . isosorbide dinitrate  10 mg Oral TID  . lisinopril  2.5 mg Oral BID  . LORazepam  1 mg Oral Once per day on Mon Wed Fri  . sertraline  100 mg Oral Daily  . sodium chloride  3 mL Intravenous Q12H   Continuous Infusions:   Time spent: 35 minutes  Revella Shelton L  [facility] [email or web address], password [facility] 01/28/2015, 1:58 PM  LOS: 2 days

## 2015-01-28 NOTE — Progress Notes (Signed)
Pt. Complained of right chest pain 7/10. MD aware, says pain is not cardiac-related. Pt doesn't complain of any nausea, or dry heaving. Has no cough. Administered percocet, will reassess pain and continue to monitor.   Ok Anis, RN 9:08 AM 01/28/2015

## 2015-01-28 NOTE — Progress Notes (Signed)
S: No SOB.  Says she is unable to return to [place] until she can pay to get her car fixed which is $800 O:BP 112/72 mmHg  Pulse 84  Temp(Src) 98 F (36.7 C) (Oral)  Resp 18  Ht 5\' 3"  (1.6 m)  Wt 55.5 kg (122 lb 5.7 oz)  BMI 21.68 kg/m2  SpO2 99%  LMP 08/23/2014 (Approximate)  Intake/Output Summary (Last 24 hours) at 01/28/15 0942 Last data filed at 01/27/15 1752  Gross per 24 hour  Intake      0 ml  Output   3500 ml  Net  -3500 ml   Weight change: -3.6 kg (-7 lb 15 oz) EHO:ZYYQM and alert CVS:RRR Resp: Clear Abd:+ BS NTND Ext: No edema  LUA AVF + bruit NEURO: CNI Ox3 no asterixis   . calcium acetate  2,001 mg Oral TID WC  . carvedilol  6.25 mg Oral BID  . Darbepoetin Alfa  60 mcg Intravenous Q Tue-HD  . doxercalciferol  9 mcg Intravenous Q T,Th,Sa-HD  . heparin  5,000 Units Subcutaneous 3 times per day  . isosorbide dinitrate  10 mg Oral TID  . lisinopril  2.5 mg Oral BID  . LORazepam  1 mg Oral Once per day on Mon Wed Fri  . sertraline  100 mg Oral Daily  . sodium chloride  3 mL Intravenous Q12H   No results found. BMET    Component Value Date/Time   NA 139 01/28/2015 0640   K 3.9 01/28/2015 0640   CL 102 01/28/2015 0640   CO2 22 01/28/2015 0640   GLUCOSE 77 01/28/2015 0640   BUN 31* 01/28/2015 0640   CREATININE 6.07* 01/28/2015 0640   CALCIUM 9.7 01/28/2015 0640   GFRNONAA 9* 01/28/2015 0640   GFRAA 10* 01/28/2015 0640   CBC    Component Value Date/Time   WBC 6.8 01/26/2015 2325   RBC 3.71* 01/26/2015 2325   HGB 10.9* 01/26/2015 2325   HCT 34.0* 01/26/2015 2325   PLT 198 01/26/2015 2325   MCV 91.6 01/26/2015 2325   MCH 29.4 01/26/2015 2325   MCHC 32.1 01/26/2015 2325   RDW 18.2* 01/26/2015 2325   LYMPHSABS 2.1 01/25/2015 2205   MONOABS 0.6 01/25/2015 2205   EOSABS 0.1 01/25/2015 2205   BASOSABS 0.0 01/25/2015 2205     Assessment: 1. ESRD 2. Severs cardiomyopathy 3. Anemia on aranesp 4. Sec HPTH on hectorol  Plan: 1. Plan HD tomorrow  and then again on Sat to get back on schedule   Snow Peoples T

## 2015-01-28 NOTE — Progress Notes (Signed)
°   01/28/15 0932  Clinical Encounter Type  Visited With Patient  Visit Type Initial;Spiritual support  Referral From Nurse  Spiritual Encounters  Spiritual Needs Prayer;Emotional  Stress Factors  Patient Stress Factors Exhausted;Financial concerns;Health changes;Loss   Patient was alert and talkative. Patient is going to stay with family in [place] until her car is repaired and she can return to [place] where she lives with her boyfriend. Patient has had issues in the past with dialysis and is going get the help that she needs to be healthy. Patient is not able to be placed on transplant registry due to her health, patient stated that she has had problems. Patient is stressed about her current situation; and patient just wants to get better and go home. Patient has family that will help her while she is in [place], and then when she returns to [place] patient stated boyfriend will be there. Patient is looking for answers as to why and has lost direction temporarily for her life. Patient is making plans to do things that will make her healthier.  Lorna Few, Chaplain Intern 01/28/2015, 9:49 AM

## 2015-01-29 LAB — BASIC METABOLIC PANEL
ANION GAP: 11 (ref 5–15)
BUN: 41 mg/dL — ABNORMAL HIGH (ref 6–23)
CO2: 26 mmol/L (ref 19–32)
CREATININE: 8.42 mg/dL — AB (ref 0.50–1.10)
Calcium: 9.8 mg/dL (ref 8.4–10.5)
Chloride: 102 mmol/L (ref 96–112)
GFR calc Af Amer: 7 mL/min — ABNORMAL LOW (ref >90)
GFR calc non Af Amer: 6 mL/min — ABNORMAL LOW (ref >90)
Glucose, Bld: 82 mg/dL (ref 70–99)
POTASSIUM: 4.1 mmol/L (ref 3.5–5.1)
Sodium: 139 mmol/L (ref 135–145)

## 2015-01-29 MED ORDER — LORAZEPAM 1 MG PO TABS: 1.0000 mg | ORAL_TABLET | ORAL | Status: DC

## 2015-01-29 MED ORDER — DOXERCALCIFEROL 4 MCG/2ML IV SOLN
INTRAVENOUS | Status: AC
  Filled 2015-01-29: qty 6

## 2015-01-29 MED ORDER — OXYCODONE-ACETAMINOPHEN 5-325 MG PO TABS
ORAL_TABLET | ORAL | Status: AC
  Filled 2015-01-29: qty 1

## 2015-01-29 MED ORDER — OXYCODONE-ACETAMINOPHEN 5-325 MG PO TABS: 1.0000 | ORAL_TABLET | ORAL | Status: DC | PRN

## 2015-01-29 MED ORDER — LISINOPRIL 2.5 MG PO TABS: 2.5000 mg | ORAL_TABLET | Freq: Two times a day (BID) | ORAL | Status: DC

## 2015-01-29 MED ORDER — CARVEDILOL 6.25 MG PO TABS: 6.2500 mg | ORAL_TABLET | Freq: Two times a day (BID) | ORAL | Status: DC

## 2015-01-29 NOTE — Progress Notes (Signed)
01/29/2015 2:17 PM Hemodialysis Outpatient Note; this patient has been denied admittance by the [facility] physicians. I am unable to get this patient placed at this time. Patient does still have a spot at [facility] dialysis tel # [phone]. Thank you.Tilman Neat

## 2015-01-29 NOTE — Progress Notes (Signed)
01/29/2015 [name] to be D/C'd Home per MD order.  Discussed prescriptions and follow up appointments with the patient. Prescriptions given to patient, medication list explained in detail. Pt verbalized understanding.    Medication List    STOP taking these medications        amLODipine 5 MG tablet  Commonly known as:  NORVASC     ramipril 5 MG capsule  Commonly known as:  ALTACE      TAKE these medications        calcium acetate 667 MG capsule  Commonly known as:  PHOSLO  Take 2 capsules (1,334 mg total) by mouth 3 (three) times daily with meals. Take 667mg  with snacks     carvedilol 6.25 MG tablet  Commonly known as:  COREG  Take 1 tablet (6.25 mg total) by mouth 2 (two) times daily.     famotidine 20 MG tablet  Commonly known as:  PEPCID  Take 20 mg by mouth daily as needed. For heartburn     isosorbide dinitrate 10 MG tablet  Commonly known as:  ISORDIL  Take 10 mg by mouth 3 (three) times daily.     lisinopril 2.5 MG tablet  Commonly known as:  PRINIVIL,ZESTRIL  Take 1 tablet (2.5 mg total) by mouth 2 (two) times daily.     LORazepam 1 MG tablet  Commonly known as:  ATIVAN  Take 1 tablet (1 mg total) by mouth See admin instructions. On dialysis days, before dialysis     multivitamin Tabs tablet  Take 1 tablet by mouth every morning.     oxyCODONE-acetaminophen 5-325 MG per tablet  Commonly known as:  PERCOCET/ROXICET  Take 1 tablet by mouth every 4 (four) hours as needed for severe pain.     sertraline 100 MG tablet  Commonly known as:  ZOLOFT  Take 100 mg by mouth daily.        Filed Vitals:   01/29/15 1210  BP: 118/75  Pulse: 92  Temp: 97.2 F (36.2 C)  Resp: 18    Skin clean, dry and intact without evidence of skin break down, no evidence of skin tears noted. IV catheter discontinued intact. Site without signs and symptoms of complications. Dressing and pressure applied. Pt denies pain at this time. No complaints noted.  An After Visit  Summary was printed and given to the patient. Patient escorted via WC, and D/C to discharge lounge where per patient her family will pick her up.  HILL, Rod Majerus 01/29/2015 2:53 PM

## 2015-01-29 NOTE — Care Management Note (Signed)
CARE MANAGEMENT NOTE 01/29/2015  Patient:  [name] Small,[name] Small   Account Number:  [id number]  Date Initiated:  01/27/2015  Documentation initiated by:  Derrico Zhong  Subjective/Objective Assessment:   CM following for progression and d/c planning.     Action/Plan:   01/27/15 Met with pt to discuss d/c needs, per pt she is unable to return to [place] to due her car needing repair. This pt states that she lives in [place] with her BF and plans to return there at the end of the month, req HD here.   Anticipated DC Date:  01/29/2015   Anticipated DC Plan:  HOME/SELF CARE         Choice offered to / List presented to:             Status of service:  Completed, signed off Medicare Important Message given?  YES (If response is "NO", the following Medicare IM given date fields will be blank) Date Medicare IM given:  01/29/2015 Medicare IM given by:  Suman Trivedi Date Additional Medicare IM given:   Additional Medicare IM given by:    Discharge Disposition:    Per UR Regulation:    If discussed at Long Length of Stay Meetings, dates discussed:    Comments:   01/29/2015 Met with this pt and CSW, V Crawford to discuss d/c plans. Pt is aware that we are unable to place her in a HD center in this area due to hx of noncompliance and the short time she plans to be in the [place] area. Per the pt  her mother will pick her up and she plans to return to [place] this pm via bus or train. She is scheduled for HD tomorrow in [place]. She states that she has family in [place] who may assist with transportation in [place]. The bus is available to get to HD, however she states that this tires her as she must walk to the bus stop. Pt states that boy friend is currently out of town and can assist when he returns. CRoyal RN MPH, case manager, [phone]  01/27/2015 Met with pt who states that she can afford to have her car repaired at the end of the month but must stay with her parents in [place] for now.  She is requesting HD until the end of the month. This CM informed the pt that we must talk with the MD and with HD @ [facility]. 11:38 This CM just informed that this pt was d/c from [facility] due to noncompliance and will not be accepted at local [facility]. Per HD center , [facility] , she will begin working of possibe adm to [facility] in the [place] area. Will continue to follow.  CRoyal RN MPH , [phone]

## 2015-01-29 NOTE — Procedures (Signed)
Pt seen on HD.  Ap 150 Vp 210.  BFR 450.  1.5kg above her DW but will try for 2L.  SW working on [employer] [facility] HD spot.

## 2015-01-29 NOTE — Discharge Summary (Signed)
Physician Discharge Summary  [name] Small WCB:[id number] DOB: 28-Oct-1990 DOA: 01/25/2015  PCP: Ellin Saba  Admit date: 01/25/2015 Discharge date: 01/29/2015  Time spent: 35 minutes  Recommendations for Outpatient Follow-up:  1. Return to [facility] for HD stat/th/tues  Discharge Diagnoses:  Principal Problem:   Fluid overload Active Problems:   ESRD (end stage renal disease)   Abdominal pain   Systolic CHF, chronic   Nausea with vomiting   Ascites   Discharge Condition: improved  Diet recommendation: renal  Filed Weights   01/27/15 1752 01/28/15 2100 01/29/15 0825  Weight: 55.5 kg (122 lb 5.7 oz) 55.8 kg (123 lb 0.3 oz) 56 kg (123 lb 7.3 oz)    History of present illness:  [name] Small is a 24 y.o. female with past medical history of PKD, ESRD on dialysis, has history of severe systolic CHF with ejection fraction of 20% (according to the patient last echo LVEF is 8%), ICD in place, history of PE in 2013. Patient currently lives in [place], she came in to visit her family in [place]. Last dialysis she had was Tuesday morning, patient said she supposed to go back to [place] but her car is broken and currently in a local car shop. She developed shortness of breath, and lower extremity edema. Patient also mentions she have some chest pain which is right-sided, increases with palpation. Patient has nausea, vomiting and diarrhea which she said she usually gets that. In the ED troponin slightly elevated at 0.27 and 0.3, the rest of the workup relatively normal except of elevated creatinine consistent with ESRD and anemia likely secondary to the CKD.  Hospital Course:  Fluid overload: Secondary to not getting dialysis for a week. Improved. Needs to return [facility] for HD   ESRD (end stage renal disease): t/th/sat  Abdominal pain secondary to fluid overload. Appears comfortable. Per RN, no N/V/D  Systolic CHF, chronic: On lisinopril, not ramipril  Nausea with  vomiting  Ascites secondary to noncompliance with HD.  Chest pain: Troponin elevated likely secondary to renal failure and heart failure. Trend is flat. Pain is atypical for ACS. No further workup needed.  Procedures:    Consultations:  nephro  Discharge Exam: Filed Vitals:   01/29/15 1030  BP: 108/78  Pulse: 87  Temp:   Resp:     General: A+Ox3, NAD Cardiovascular: rrr Respiratory: clear  Discharge Instructions   Discharge Instructions    Discharge instructions    Complete by:  As directed   Continue HD in [place]- T/Th/Sat Renal diet     Increase activity slowly    Complete by:  As directed           Current Discharge Medication List    CONTINUE these medications which have NOT CHANGED   Details  calcium acetate (PHOSLO) 667 MG capsule Take 2 capsules (1,334 mg total) by mouth 3 (three) times daily with meals. Take 667mg  with snacks Qty: 120 capsule, Refills: 2    carvedilol (COREG) 6.25 MG tablet Take 6.25 mg by mouth 2 (two) times daily.    famotidine (PEPCID) 20 MG tablet Take 20 mg by mouth daily as needed. For heartburn    isosorbide dinitrate (ISORDIL) 10 MG tablet Take 10 mg by mouth 3 (three) times daily.    lisinopril (PRINIVIL,ZESTRIL) 2.5 MG tablet Take 2.5 mg by mouth 2 (two) times daily.    LORazepam (ATIVAN) 1 MG tablet Take 1 mg by mouth See admin instructions. On dialysis days, before dialysis Refills: 0  multivitamin (RENA-VIT) TABS tablet Take 1 tablet by mouth every morning.    sertraline (ZOLOFT) 100 MG tablet Take 100 mg by mouth daily.      STOP taking these medications     amLODipine (NORVASC) 5 MG tablet      ramipril (ALTACE) 5 MG capsule        Allergies  Allergen Reactions  . Tramadol Anaphylaxis  . Morphine And Related Itching    Ok with oxycodone  . Vicodin [Hydrocodone-Acetaminophen] Other (See Comments)    unknown      The results of significant diagnostics from this hospitalization (including  imaging, microbiology, ancillary and laboratory) are listed below for reference.    Significant Diagnostic Studies: Dg Chest 2 View  01/25/2015   CLINICAL DATA:  Chest pain  EXAM: CHEST  2 VIEW  COMPARISON:  08/30/2014  FINDINGS: Chronic cardiopericardial enlargement. There is no edema, consolidation, effusion, or pneumothorax.  Single chamber ICD/ pacer from the right is in stable position compared to chest CT 12/20/2014.  Distal clavicle erosions consistent with renal osteodystrophy.  IMPRESSION: 1. Cardiomegaly without failure. 2. Renal osteodystrophy.   Electronically Signed   By: Marnee Spring M.D.   On: 01/25/2015 22:45   US Venous Img Lower Bilateral  01/25/2015   CLINICAL DATA:  Bilateral lower extremity swelling for 2 days. No dialysis for 6 days. History of previous pulmonary embolus.  EXAM: BILATERAL LOWER EXTREMITY VENOUS DOPPLER ULTRASOUND  TECHNIQUE: Gray-scale sonography with graded compression, as well as color Doppler and duplex ultrasound were performed to evaluate the lower extremity deep venous systems from the level of the common femoral vein and including the common femoral, femoral, profunda femoral, popliteal and calf veins including the posterior tibial, peroneal and gastrocnemius veins when visible. The superficial great saphenous vein was also interrogated. Spectral Doppler was utilized to evaluate flow at rest and with distal augmentation maneuvers in the common femoral, femoral and popliteal veins.  COMPARISON:  11/12/2012  FINDINGS: RIGHT LOWER EXTREMITY  Common Femoral Vein: No evidence of thrombus. Normal compressibility, respiratory phasicity and response to augmentation.  Saphenofemoral Junction: No evidence of thrombus. Normal compressibility and flow on color Doppler imaging.  Profunda Femoral Vein: No evidence of thrombus. Normal compressibility and flow on color Doppler imaging.  Femoral Vein: No evidence of thrombus. Normal compressibility, respiratory phasicity and  response to augmentation.  Popliteal Vein: No evidence of thrombus. Normal compressibility, respiratory phasicity and response to augmentation.  Calf Veins: No evidence of thrombus. Normal compressibility and flow on color Doppler imaging.  Superficial Great Saphenous Vein: No evidence of thrombus. Normal compressibility and flow on color Doppler imaging.  Venous Reflux:  None.  Other Findings:  None.  LEFT LOWER EXTREMITY  Common Femoral Vein: No evidence of thrombus. Normal compressibility, respiratory phasicity and response to augmentation.  Saphenofemoral Junction: No evidence of thrombus. Normal compressibility and flow on color Doppler imaging.  Profunda Femoral Vein: No evidence of thrombus. Normal compressibility and flow on color Doppler imaging.  Femoral Vein: No evidence of thrombus. Normal compressibility, respiratory phasicity and response to augmentation.  Popliteal Vein: No evidence of thrombus. Normal compressibility, respiratory phasicity and response to augmentation.  Calf Veins: No evidence of thrombus. Normal compressibility and flow on color Doppler imaging.  Superficial Great Saphenous Vein: No evidence of thrombus. Normal compressibility and flow on color Doppler imaging.  Venous Reflux:  None.  Other Findings:  None.  IMPRESSION: No evidence of deep venous thrombosis.   Electronically Signed   By: Chrissie Noa  Andria Meuse M.D.   On: 01/25/2015 23:08    Microbiology: Recent Results (from the past 240 hour(s))  MRSA PCR Screening     Status: None   Collection Time: 01/26/15 12:25 PM  Result Value Ref Range Status   MRSA by PCR NEGATIVE NEGATIVE Final    Comment:        The GeneXpert MRSA Assay (FDA approved for NASAL specimens only), is one component of a comprehensive MRSA colonization surveillance program. It is not intended to diagnose MRSA infection nor to guide or monitor treatment for MRSA infections.      Labs: Basic Metabolic Panel:  Recent Labs Lab 01/25/15 2205  01/26/15 2325 01/27/15 1436 01/28/15 0640 01/29/15 0843  NA 136 137 135 139 139  K 4.9 4.4 4.9 3.9 4.1  CL 95* 96 96 102 102  CO2 GLUCOSE 93 74 101* 77 82  BUN 95* 43* 53* 31* 41*  CREATININE 11.89* 7.24* 8.65* 6.07* 8.42*  CALCIUM 10.0 10.2 9.7 9.7 9.8  PHOS  --   --  7.7* 6.3*  --    Liver Function Tests:  Recent Labs Lab 01/25/15 2205 01/27/15 1436 01/28/15 0640  AST 24  --   --   ALT 11  --   --   ALKPHOS 238*  --   --   BILITOT 0.3  --   --   PROT 8.1  --   --   ALBUMIN 4.0 3.2* 3.0*   No results for input(s): LIPASE, AMYLASE in the last 168 hours. No results for input(s): AMMONIA in the last 168 hours. CBC:  Recent Labs Lab 01/25/15 2205 01/26/15 2325  WBC 8.1 6.8  NEUTROABS 5.3  --   HGB 10.4* 10.9*  HCT 32.1* 34.0*  MCV 91.2 91.6  PLT 225 198   Cardiac Enzymes:  Recent Labs Lab 01/25/15 2205 01/26/15 0625 01/26/15 1240 01/26/15 2024 01/26/15 2325  TROPONINI 0.27* 0.30* 0.30* 0.27* 0.26*   BNP: BNP (last 3 results) No results for input(s): BNP in the last 8760 hours.  ProBNP (last 3 results) No results for input(s): PROBNP in the last 8760 hours.  CBG: No results for input(s): GLUCAP in the last 168 hours.     SignedMarlin Canary  [facility] 01/29/2015, 11:01 AM

## 2015-02-04 ENCOUNTER — Encounter (HOSPITAL_BASED_OUTPATIENT_CLINIC_OR_DEPARTMENT_OTHER): Payer: Self-pay

## 2015-02-04 ENCOUNTER — Emergency Department (HOSPITAL_BASED_OUTPATIENT_CLINIC_OR_DEPARTMENT_OTHER): Payer: Medicare Other

## 2015-02-04 ENCOUNTER — Emergency Department (HOSPITAL_BASED_OUTPATIENT_CLINIC_OR_DEPARTMENT_OTHER)
Admission: EM | Admit: 2015-02-04 | Discharge: 2015-02-04 | Discharge status: Against Medical Advice | Payer: Medicare Other | Attending: Emergency Medicine | Admitting: Emergency Medicine

## 2015-02-04 DIAGNOSIS — N186 End stage renal disease: Secondary | ICD-10-CM | POA: Diagnosis not present

## 2015-02-04 DIAGNOSIS — Z862 Personal history of diseases of the blood and blood-forming organs and certain disorders involving the immune mechanism: Secondary | ICD-10-CM | POA: Diagnosis not present

## 2015-02-04 DIAGNOSIS — I509 Heart failure, unspecified: Secondary | ICD-10-CM | POA: Insufficient documentation

## 2015-02-04 DIAGNOSIS — Q613 Polycystic kidney, unspecified: Secondary | ICD-10-CM | POA: Diagnosis not present

## 2015-02-04 DIAGNOSIS — E875 Hyperkalemia: Secondary | ICD-10-CM

## 2015-02-04 DIAGNOSIS — I120 Hypertensive chronic kidney disease with stage 5 chronic kidney disease or end stage renal disease: Secondary | ICD-10-CM | POA: Insufficient documentation

## 2015-02-04 DIAGNOSIS — R140 Abdominal distension (gaseous): Secondary | ICD-10-CM | POA: Insufficient documentation

## 2015-02-04 DIAGNOSIS — Z86711 Personal history of pulmonary embolism: Secondary | ICD-10-CM | POA: Diagnosis not present

## 2015-02-04 DIAGNOSIS — R0602 Shortness of breath: Secondary | ICD-10-CM | POA: Diagnosis not present

## 2015-02-04 DIAGNOSIS — Z872 Personal history of diseases of the skin and subcutaneous tissue: Secondary | ICD-10-CM | POA: Insufficient documentation

## 2015-02-04 DIAGNOSIS — Z79899 Other long term (current) drug therapy: Secondary | ICD-10-CM | POA: Diagnosis not present

## 2015-02-04 DIAGNOSIS — R079 Chest pain, unspecified: Secondary | ICD-10-CM | POA: Diagnosis present

## 2015-02-04 DIAGNOSIS — Z87891 Personal history of nicotine dependence: Secondary | ICD-10-CM | POA: Insufficient documentation

## 2015-02-04 DIAGNOSIS — Z992 Dependence on renal dialysis: Secondary | ICD-10-CM | POA: Diagnosis not present

## 2015-02-04 DIAGNOSIS — M7989 Other specified soft tissue disorders: Secondary | ICD-10-CM | POA: Insufficient documentation

## 2015-02-04 LAB — CBC
HCT: 35.4 % — ABNORMAL LOW (ref 36.0–46.0)
Hemoglobin: 11.3 g/dL — ABNORMAL LOW (ref 12.0–15.0)
MCH: 29.5 pg (ref 26.0–34.0)
MCHC: 31.9 g/dL (ref 30.0–36.0)
MCV: 92.4 fL (ref 78.0–100.0)
Platelets: 200 10*3/uL (ref 150–400)
RBC: 3.83 MIL/uL — ABNORMAL LOW (ref 3.87–5.11)
RDW: 19 % — ABNORMAL HIGH (ref 11.5–15.5)
WBC: 6.3 10*3/uL (ref 4.0–10.5)

## 2015-02-04 LAB — COMPREHENSIVE METABOLIC PANEL
ALT: 9 U/L (ref 0–35)
ANION GAP: 18 — AB (ref 5–15)
AST: 21 U/L (ref 0–37)
Albumin: 3.6 g/dL (ref 3.5–5.2)
Alkaline Phosphatase: 236 U/L — ABNORMAL HIGH (ref 39–117)
BUN: 100 mg/dL — ABNORMAL HIGH (ref 6–23)
CO2: 18 mmol/L — AB (ref 19–32)
CREATININE: 13.71 mg/dL — AB (ref 0.50–1.10)
Calcium: 10 mg/dL (ref 8.4–10.5)
Chloride: 100 mmol/L (ref 96–112)
GFR, EST AFRICAN AMERICAN: 4 mL/min — AB (ref >90)
GFR, EST NON AFRICAN AMERICAN: 3 mL/min — AB (ref >90)
GLUCOSE: 88 mg/dL (ref 70–99)
Potassium: 5.8 mmol/L — ABNORMAL HIGH (ref 3.5–5.1)
SODIUM: 136 mmol/L (ref 135–145)
TOTAL PROTEIN: 8 g/dL (ref 6.0–8.3)
Total Bilirubin: 0.9 mg/dL (ref 0.3–1.2)

## 2015-02-04 LAB — TROPONIN I: TROPONIN I: 0.18 ng/mL — AB (ref <0.031)

## 2015-02-04 MED ORDER — SODIUM POLYSTYRENE SULFONATE 15 GM/60ML PO SUSP
30.0000 g | Freq: Once | ORAL | Status: AC
  Administered 2015-02-04: 30 g via ORAL
  Filled 2015-02-04: qty 120

## 2015-02-04 MED ORDER — HYDROMORPHONE HCL 1 MG/ML IJ SOLN
1.0000 mg | Freq: Once | INTRAMUSCULAR | Status: AC
  Administered 2015-02-04: 1 mg via INTRAMUSCULAR
  Filled 2015-02-04: qty 1

## 2015-02-04 MED ORDER — DIPHENHYDRAMINE HCL 25 MG PO CAPS
50.0000 mg | ORAL_CAPSULE | Freq: Once | ORAL | Status: AC
  Administered 2015-02-04: 50 mg via ORAL
  Filled 2015-02-04: qty 2

## 2015-02-04 MED ORDER — ASPIRIN 81 MG PO CHEW
324.0000 mg | CHEWABLE_TABLET | Freq: Once | ORAL | Status: AC
  Administered 2015-02-04: 324 mg via ORAL
  Filled 2015-02-04: qty 4

## 2015-02-04 NOTE — ED Notes (Signed)
Pt reports CP since last night. Pt had received dialysis last week. Pt supposed to have T/TH/S.

## 2015-02-04 NOTE — ED Notes (Signed)
Labs drawn from Right Radial Artery x 1 attempt. No complications noted.  Bleeding stopped.

## 2015-02-04 NOTE — ED Provider Notes (Signed)
CSN: [id number]     Arrival date & time 02/04/15  1628 History   First MD Initiated Contact with Patient 02/04/15 1646     Chief Complaint  Patient presents with  . Chest Pain     (Consider location/radiation/quality/duration/timing/severity/associated sxs/prior Treatment) HPI  24 year old female with a past medical history of ESRD on dialysis, CHF/cardiomyopathy, PE, and polycystic kidney disease presents after not having dialysis for the past 7 days. She last had dialysis before she was discharged [facility]. Last night she started developing chest pain that feels like a squeezing sensation is worse with inspiration. She had this similar presentation one week ago. She states she cannot tell if it is from missing dialysis or when she had a pulmonary embolism. She's not had any focal unilateral leg swelling but states both of her legs as well as her abdomen have been swelling. Has had some orthopnea. Negative for breath due to her abdomen. She is requesting her potassium be checked to see if she can make it without dialysis until she gets back to [facility] 2 days from now, which is where she typically gets dialysis. Requesting a "shot for pain".  Past Medical History  Diagnosis Date  . Hemodialysis patient   . Hypertension   . Pulmonary emboli 01/2012    Bilateral, moderate clot burden, areas of pulmonary infarction and central necrosis  . CHF (congestive heart failure)   . Cardiomyopathy   . Dysrhythmia     at times per pt.  . Anemia   . H/O transfusion of packed red blood cells   . 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 Polycystic kidney disease   . Cellulitis and abscess of face 03/22/2013   Past Surgical History  Procedure Laterality Date  . Nephrectomy    . Av fistula placement    . Kidney transplant  2008    failed  . Tonsillectomy      as a child.  . Adenoidectomy    .  Incision and drainage abscess Right 03/21/2013    Procedure: INCISION AND DRAINAGE RIGHT CHEEK ABSCESS REMOVAL OF FOREIGN BODY;  Surgeon: Serena Colonel, MD;  Location: [facility] OR;  Service: ENT;  Laterality: Right;   Family History  Problem Relation Age of Onset  . Polycystic kidney disease Father    History  Substance Use Topics  . Smoking status: Former Smoker -- 0.20 packs/day    Quit date: 05/06/2012  . Smokeless tobacco: Never Used  . Alcohol Use: No   OB History    No data available     Review of Systems  Constitutional: Negative for fever.  Respiratory: Positive for shortness of breath. Negative for cough.   Cardiovascular: Positive for chest pain and leg swelling.  Gastrointestinal: Positive for abdominal distention.  All other systems reviewed and are negative.     Allergies  Tramadol; Morphine and related; and Vicodin  Home Medications   Prior to Admission medications   Medication Sig Start Date End Date Taking? Authorizing Provider  calcium acetate (PHOSLO) 667 MG capsule Take 2 capsules (1,334 mg total) by mouth 3 (three) times daily with meals. Take  with snacks 05/01/13   [name], MD  carvedilol (COREG) 6.25 MG tablet Take 1 tablet (6.25 mg total) by mouth 2 (two) times daily. 01/29/15   Joseph Art, DO  famotidine (PEPCID) 20 MG tablet Take 20 mg by mouth daily as  needed. For heartburn 03/24/13   Elease Etienne, MD  isosorbide dinitrate (ISORDIL) 10 MG tablet Take 10 mg by mouth 3 (three) times daily. 01/19/15 01/19/16  Historical Provider, MD  lisinopril (PRINIVIL,ZESTRIL) 2.5 MG tablet Take 1 tablet (2.5 mg total) by mouth 2 (two) times daily. 01/29/15 01/29/16  Joseph Art, DO  LORazepam (ATIVAN) 1 MG tablet Take 1 tablet (1 mg total) by mouth See admin instructions. On dialysis days, before dialysis 01/29/15   Joseph Art, DO  multivitamin (RENA-VIT) TABS tablet Take 1 tablet by mouth every morning.    Historical Provider, MD  oxyCODONE-acetaminophen  (PERCOCET/ROXICET) 5-325 MG per tablet Take 1 tablet by mouth every 4 (four) hours as needed for severe pain. 01/29/15   Joseph Art, DO  sertraline (ZOLOFT) 100 MG tablet Take 100 mg by mouth daily.    Historical Provider, MD   BP 143/102 mmHg  Pulse 92  Temp(Src) 98.6 F (37 C) (Oral)  Resp 22  Ht 5\' 3"  (1.6 m)  Wt 136 lb (61.689 kg)  BMI 24.10 kg/m2  SpO2 100%  LMP 08/23/2014 (Approximate) Physical Exam  Constitutional: She is oriented to person, place, and time. She appears well-developed and well-nourished. No distress.  HENT:  Head: Normocephalic and atraumatic.  Right Ear: External ear normal.  Left Ear: External ear normal.  Nose: Nose normal.  Eyes: Right eye exhibits no discharge. Left eye exhibits no discharge.  Cardiovascular: Normal rate, regular rhythm and normal heart sounds.   Pulmonary/Chest: Effort normal and breath sounds normal. She has no rales. She exhibits no tenderness.  ICD to right chest, nontender  Abdominal: Soft. She exhibits distension (mild fluid wave c/w ascites). There is no tenderness.  Musculoskeletal: She exhibits no edema.  Neurological: She is alert and oriented to person, place, and time.  Skin: Skin is warm and dry. She is not diaphoretic.  Nursing note and vitals reviewed.   ED Course  Procedures (including critical care time) Labs Review Labs Reviewed  CBC - Abnormal; Notable for the following:    RBC 3.83 (*)    Hemoglobin 11.3 (*)    HCT 35.4 (*)    RDW 19.0 (*)    All other components within normal limits  COMPREHENSIVE METABOLIC PANEL - Abnormal; Notable for the following:    Potassium 5.8 (*)    CO2 18 (*)    BUN 100 (*)    Creatinine, Ser 13.71 (*)    Alkaline Phosphatase 236 (*)    GFR calc non Af Amer 3 (*)    GFR calc Af Amer 4 (*)    Anion gap 18 (*)    All other components within normal limits  TROPONIN I - Abnormal; Notable for the following:    Troponin I 0.18 (*)    All other components within normal limits     Imaging Review Dg Chest 2 View  02/04/2015   CLINICAL DATA:  Left chest pain with shortness of breath for 1 day. History of dialysis and pacemaker placement 1 month ago. Former smoker with history of pulmonary embolism. Initial encounter.  EXAM: CHEST  2 VIEW  COMPARISON:  Radiographs 01/25/2015.  CT 12/12/2014.  FINDINGS: Right subclavian pacemaker lead appears unchanged at the right ventricular apex. There is stable moderate cardiomegaly and vascular congestion. There is no edema, confluent airspace opacity or significant pleural effusion. The bones appear unchanged with bilateral distal clavicular osteolysis.  IMPRESSION: Stable chest with cardiomegaly and chronic vascular congestion. No acute findings.  Electronically Signed   By: Carey Bullocks M.D.   On: 02/04/2015 17:42     EKG Interpretation   Date/Time:  Thursday February 04 2015 16:35:39 EDT Ventricular Rate:  94 PR Interval:  276 QRS Duration: 80 QT Interval:  350 QTC Calculation: 437 R Axis:   88 Text Interpretation:  Sinus rhythm with 1st degree A-V block T wave  abnormality, consider inferolateral ischemia Abnormal ECG no significant  change since January 25 2015 Confirmed by Criss Alvine  MD, Terance Pomplun [phone]) on  02/04/2015 4:51:53 PM      MDM   Final diagnoses:  Chest pain, unspecified chest pain type  Hyperkalemia    Patient's CP similar to one week ago when she also had gone about 1 week without dialysis. Has history of PE but no tachycardia, increased WOB or hypoxia. CXR stable. Mild troponin elevation but less than last week, and review shows multiple mildly elevated troponins (~0.2), I feel this does not represent acute ischemia. K is 5.8, no EKG changes. With the hyperkalemia I recommended admission and dialysis. Patient does not want to be admitted and wants to be discharged. She would rather get dialysis in [place] tomorrow. After lengthy discussion patient still wants to leave. She has decision making capacity and  understands risks/benefits, including life-threatening hyperkalemia. Will sign out AMA. Will give dose of kayexalate prior to discharge. Discussed strict return precautions.    Pricilla Loveless, MD 02/05/15 [phone]

## 2015-02-04 NOTE — Discharge Instructions (Signed)
Your potassium today is 5.8. Your troponin (heart test) is also mildly elevated at 0.18. You have decided to leave AGAINST MEDICAL ADVICE instead of be admitted to [facility] for dialysis and observation. If any of your symptoms worsen he should return to this ER or any ER for reevaluation and possible dialysis. It is very important you go to [facility] immediately to get seen there for dialysis as soon as possible.  Hyperkalemia Hyperkalemia is when you have too much potassium in your blood. This can be a life-threatening condition. Potassium is normally removed (excreted) from the body by the kidneys. CAUSES  The potassium level in your body can become too high for the following reasons:  You take in too much potassium. You can do this by:  Using salt substitutes. They contain large amounts of potassium.  Taking potassium supplements from your caregiver. The dose may be too high for you.  Eating foods or taking nutritional products with potassium.  You excrete too little potassium. This can happen if:  Your kidneys are not functioning properly. Kidney (renal) disease is a very common cause of hyperkalemia.  You are taking medicines that lower your excretion of potassium, such as certain diuretic medicines.  You have an adrenal gland disease called Addison's disease.  You have a urinary tract obstruction, such as kidney stones.  You are on treatment to mechanically clean your blood (dialysis) and you skip a treatment.  You release a high amount of potassium from your cells into your blood. You may have a condition that causes potassium to move from your cells to your bloodstream. This can happen with:  Injury to muscles or other tissues. Most potassium is stored in the muscles.  Severe burns or infections.  Acidic blood plasma (acidosis). Acidosis can result from many diseases, such as uncontrolled diabetes. SYMPTOMS  Usually, there are no symptoms unless the potassium is  dangerously high or has risen very quickly. Symptoms may include:  Irregular or very slow heartbeat.  Feeling sick to your stomach (nauseous).  Tiredness (fatigue).  Nerve problems such as tingling of the skin, numbness of the hands or feet, weakness, or paralysis. DIAGNOSIS  A simple blood test can measure the amount of potassium in your body. An electrocardiogram test of the heart can also help make the diagnosis. The heart may beat dangerously fast or slow down and stop beating with severe hyperkalemia.  TREATMENT  Treatment depends on how bad the condition is and on the underlying cause.  If the hyperkalemia is an emergency (causing heart problems or paralysis), many different medicines can be used alone or together to lower the potassium level briefly. This may include an insulin injection even if you are not diabetic. Emergency dialysis may be needed to remove potassium from the body.  If the hyperkalemia is less severe or dangerous, the underlying cause is treated. This can include taking medicines if needed. Your prescription medicines may be changed. You may also need to take a medicine to help your body get rid of potassium. You may need to eat a diet low in potassium. HOME CARE INSTRUCTIONS   Take medicines and supplements as directed by your caregiver.  Do not take any over-the-counter medicines, supplements, natural products, herbs, or vitamins without reviewing them with your caregiver. Certain supplements and natural food products can have high amounts of potassium. Other products (such as ibuprofen) can damage weak kidneys and raise your potassium.  You may be asked to do repeat lab tests. Be sure  to follow these directions.  If you have kidney disease, you may need to follow a low potassium diet. SEEK MEDICAL CARE IF:   You notice an irregular or very slow heartbeat.  You feel lightheaded.  You develop weakness that is unusual for you. SEEK IMMEDIATE MEDICAL CARE IF:    You have shortness of breath.  You have chest discomfort.  You pass out (faint). MAKE SURE YOU:   Understand these instructions.  Will watch your condition.  Will get help right away if you are not doing well or get worse. Document Released: 09/29/2002 Document Revised: 01/01/2012 Document Reviewed: 01/14/2014 [facility] Patient Information 2015 [place], [place]. This information is not intended to replace advice given to you by your health care provider. Make sure you discuss any questions you have with your health care provider.   End-Stage Kidney Disease The kidneys are two organs that lie on either side of the spine between the middle of the back and the front of the abdomen. The kidneys:   Remove wastes and extra water from the blood.   Produce important hormones. These help keep bones strong, regulate blood pressure, and help create red blood cells.   Balance the fluids and chemicals in the blood and tissues. End-stage kidney disease occurs when the kidneys are so damaged that they cannot do their job. When the kidneys cannot do their job, life-threatening problems occur. The body cannot stay clean and strong without the help of the kidneys. In end-stage kidney disease, the kidneys cannot get better.You need a new kidney or treatments to do some of the work healthy kidneys do in order to stay alive. CAUSES  End-stage kidney disease usually occurs when a long-lasting (chronic) kidney disease gets worse. It may also occur after the kidneys are suddenly damaged (acute kidney injury).  SYMPTOMS   Swelling (edema) of the legs, ankles, or feet.   Tiredness (lethargy).   Nausea or vomiting.   Confusion.   Problems with urination, such as:   Decreased urine production.   Frequent urination, especially at night.   Frequent accidents in children who are potty trained.   Muscle twitches and cramps.   Persistent itchiness.   Loss of appetite.   Headaches.    Abnormally dark or light skin.   Numbness in the hands or feet.   Easy bruising.   Frequent hiccups.   Menstruation stops. DIAGNOSIS  Your health care provider will measure your blood pressure and take some tests. These may include:   Urine tests.   Blood tests.   Imaging tests, such as:   An ultrasound exam.   Computed tomography (CT).  A kidney biopsy. TREATMENT  There are two treatments for end-stage kidney disease:   A procedure that removes toxic wastes from the body (dialysis).   Receiving a new kidney (kidney transplant). Both of these treatments have serious risks and consequences. Your health care provider will help you determine which treatment is best for you based on your health, age, and other factors. In addition to having dialysis or a kidney transplant, you may need to take medicines to control high blood pressure (hypertension) and cholesterol and to decrease phosphorus levels in your blood.  HOME CARE INSTRUCTIONS  Follow your prescribed diet.   Take medicines only as directed by your health care provider.   Do not take any new medicines (prescription, over-the-counter, or nutritional supplements) unless approved by your health care provider. Many medicines can worsen your kidney damage or need to have the  dose adjusted.   Keep all follow-up visits as directed by your health care provider. MAKE SURE YOU:  Understand these instructions.  Will watch your condition.  Will get help right away if you are not doing well or get worse. Document Released: 12/30/2003 Document Revised: 02/23/2014 Document Reviewed: 06/07/2012 [facility] Patient Information 2015 [place], [place]. This information is not intended to replace advice given to you by your health care provider. Make sure you discuss any questions you have with your health care provider.    Chest Pain (Nonspecific) It is often hard to give a specific diagnosis for the cause of chest  pain. There is always a chance that your pain could be related to something serious, such as a heart attack or a blood clot in the lungs. You need to follow up with your health care provider for further evaluation. CAUSES   Heartburn.  Pneumonia or bronchitis.  Anxiety or stress.  Inflammation around your heart (pericarditis) or lung (pleuritis or pleurisy).  A blood clot in the lung.  A collapsed lung (pneumothorax). It can develop suddenly on its own (spontaneous pneumothorax) or from trauma to the chest.  Shingles infection (herpes zoster virus). The chest wall is composed of bones, muscles, and cartilage. Any of these can be the source of the pain.  The bones can be bruised by injury.  The muscles or cartilage can be strained by coughing or overwork.  The cartilage can be affected by inflammation and become sore (costochondritis). DIAGNOSIS  Lab tests or other studies may be needed to find the cause of your pain. Your health care provider may have you take a test called an ambulatory electrocardiogram (ECG). An ECG records your heartbeat patterns over a 24-hour period. You may also have other tests, such as:  Transthoracic echocardiogram (TTE). During echocardiography, sound waves are used to evaluate how blood flows through your heart.  Transesophageal echocardiogram (TEE).  Cardiac monitoring. This allows your health care provider to monitor your heart rate and rhythm in real time.  Holter monitor. This is a portable device that records your heartbeat and can help diagnose heart arrhythmias. It allows your health care provider to track your heart activity for several days, if needed.  Stress tests by exercise or by giving medicine that makes the heart beat faster. TREATMENT   Treatment depends on what may be causing your chest pain. Treatment may include:  Acid blockers for heartburn.  Anti-inflammatory medicine.  Pain medicine for inflammatory  conditions.  Antibiotics if an infection is present.  You may be advised to change lifestyle habits. This includes stopping smoking and avoiding alcohol, caffeine, and chocolate.  You may be advised to keep your head raised (elevated) when sleeping. This reduces the chance of acid going backward from your stomach into your esophagus. Most of the time, nonspecific chest pain will improve within 2-3 days with rest and mild pain medicine.  HOME CARE INSTRUCTIONS   If antibiotics were prescribed, take them as directed. Finish them even if you start to feel better.  For the next few days, avoid physical activities that bring on chest pain. Continue physical activities as directed.  Do not use any tobacco products, including cigarettes, chewing tobacco, or electronic cigarettes.  Avoid drinking alcohol.  Only take medicine as directed by your health care provider.  Follow your health care provider's suggestions for further testing if your chest pain does not go away.  Keep any follow-up appointments you made. If you do not go to an  appointment, you could develop lasting (chronic) problems with pain. If there is any problem keeping an appointment, call to reschedule. SEEK MEDICAL CARE IF:   Your chest pain does not go away, even after treatment.  You have a rash with blisters on your chest.  You have a fever. SEEK IMMEDIATE MEDICAL CARE IF:   You have increased chest pain or pain that spreads to your arm, neck, jaw, back, or abdomen.  You have shortness of breath.  You have an increasing cough, or you cough up blood.  You have severe back or abdominal pain.  You feel nauseous or vomit.  You have severe weakness.  You faint.  You have chills. This is an emergency. Do not wait to see if the pain will go away. Get medical help at once. Call your local emergency services (911 in [place] Do not drive yourself to the hospital. MAKE SURE YOU:   Understand these  instructions.  Will watch your condition.  Will get help right away if you are not doing well or get worse. Document Released: 07/19/2005 Document Revised: 10/14/2013 Document Reviewed: 05/14/2008 [facility] Patient Information 2015 [place], [place]. This information is not intended to replace advice given to you by your health care provider. Make sure you discuss any questions you have with your health care provider.

## 2015-02-12 ENCOUNTER — Emergency Department (HOSPITAL_BASED_OUTPATIENT_CLINIC_OR_DEPARTMENT_OTHER): Payer: Medicare Other

## 2015-02-12 ENCOUNTER — Encounter (HOSPITAL_BASED_OUTPATIENT_CLINIC_OR_DEPARTMENT_OTHER): Payer: Self-pay | Admitting: *Deleted

## 2015-02-12 ENCOUNTER — Emergency Department (HOSPITAL_BASED_OUTPATIENT_CLINIC_OR_DEPARTMENT_OTHER)
Admission: EM | Admit: 2015-02-12 | Discharge: 2015-02-12 | Disposition: A | Discharge status: Home / Self Care | Payer: Medicare Other | Attending: Emergency Medicine | Admitting: Emergency Medicine

## 2015-02-12 DIAGNOSIS — I509 Heart failure, unspecified: Secondary | ICD-10-CM | POA: Diagnosis not present

## 2015-02-12 DIAGNOSIS — N186 End stage renal disease: Secondary | ICD-10-CM | POA: Diagnosis not present

## 2015-02-12 DIAGNOSIS — Z86711 Personal history of pulmonary embolism: Secondary | ICD-10-CM | POA: Diagnosis not present

## 2015-02-12 DIAGNOSIS — Y831 Surgical operation with implant of artificial internal device as the cause of abnormal reaction of the patient, or of later complication, without mention of misadventure at the time of the procedure: Secondary | ICD-10-CM | POA: Insufficient documentation

## 2015-02-12 DIAGNOSIS — R52 Pain, unspecified: Secondary | ICD-10-CM

## 2015-02-12 DIAGNOSIS — Z862 Personal history of diseases of the blood and blood-forming organs and certain disorders involving the immune mechanism: Secondary | ICD-10-CM | POA: Insufficient documentation

## 2015-02-12 DIAGNOSIS — R0789 Other chest pain: Secondary | ICD-10-CM | POA: Diagnosis not present

## 2015-02-12 DIAGNOSIS — Z79899 Other long term (current) drug therapy: Secondary | ICD-10-CM | POA: Diagnosis not present

## 2015-02-12 DIAGNOSIS — I120 Hypertensive chronic kidney disease with stage 5 chronic kidney disease or end stage renal disease: Secondary | ICD-10-CM | POA: Diagnosis not present

## 2015-02-12 DIAGNOSIS — T82897A Other specified complication of cardiac prosthetic devices, implants and grafts, initial encounter: Secondary | ICD-10-CM | POA: Diagnosis present

## 2015-02-12 DIAGNOSIS — Z940 Kidney transplant status: Secondary | ICD-10-CM | POA: Diagnosis not present

## 2015-02-12 DIAGNOSIS — Z992 Dependence on renal dialysis: Secondary | ICD-10-CM | POA: Insufficient documentation

## 2015-02-12 DIAGNOSIS — Z87891 Personal history of nicotine dependence: Secondary | ICD-10-CM | POA: Diagnosis not present

## 2015-02-12 DIAGNOSIS — Q613 Polycystic kidney, unspecified: Secondary | ICD-10-CM | POA: Insufficient documentation

## 2015-02-12 LAB — CBC WITH DIFFERENTIAL/PLATELET
BASOS ABS: 0 10*3/uL (ref 0.0–0.1)
Basophils Relative: 0 % (ref 0–1)
EOS ABS: 0.1 10*3/uL (ref 0.0–0.7)
Eosinophils Relative: 1 % (ref 0–5)
HCT: 32.4 % — ABNORMAL LOW (ref 36.0–46.0)
Hemoglobin: 10.6 g/dL — ABNORMAL LOW (ref 12.0–15.0)
LYMPHS PCT: 26 % (ref 12–46)
Lymphs Abs: 2.2 10*3/uL (ref 0.7–4.0)
MCH: 29.6 pg (ref 26.0–34.0)
MCHC: 32.7 g/dL (ref 30.0–36.0)
MCV: 90.5 fL (ref 78.0–100.0)
MONO ABS: 0.6 10*3/uL (ref 0.1–1.0)
Monocytes Relative: 7 % (ref 3–12)
Neutro Abs: 5.7 10*3/uL (ref 1.7–7.7)
Neutrophils Relative %: 66 % (ref 43–77)
PLATELETS: 235 10*3/uL (ref 150–400)
RBC: 3.58 MIL/uL — ABNORMAL LOW (ref 3.87–5.11)
RDW: 17.5 % — ABNORMAL HIGH (ref 11.5–15.5)
WBC: 8.5 10*3/uL (ref 4.0–10.5)

## 2015-02-12 LAB — BASIC METABOLIC PANEL
Anion gap: 15 (ref 5–15)
BUN: 103 mg/dL — ABNORMAL HIGH (ref 6–23)
CALCIUM: 9.2 mg/dL (ref 8.4–10.5)
CHLORIDE: 101 mmol/L (ref 96–112)
CO2: 21 mmol/L (ref 19–32)
CREATININE: 10.77 mg/dL — AB (ref 0.50–1.10)
GFR calc Af Amer: 5 mL/min — ABNORMAL LOW (ref >90)
GFR calc non Af Amer: 4 mL/min — ABNORMAL LOW (ref >90)
Glucose, Bld: 94 mg/dL (ref 70–99)
POTASSIUM: 4.8 mmol/L (ref 3.5–5.1)
Sodium: 137 mmol/L (ref 135–145)

## 2015-02-12 MED ORDER — HYDROMORPHONE HCL 1 MG/ML IJ SOLN
2.0000 mg | Freq: Once | INTRAMUSCULAR | Status: AC
  Administered 2015-02-12: 2 mg via INTRAMUSCULAR
  Filled 2015-02-12: qty 2

## 2015-02-12 MED ORDER — DIPHENHYDRAMINE HCL 25 MG PO CAPS
50.0000 mg | ORAL_CAPSULE | Freq: Once | ORAL | Status: AC
  Administered 2015-02-12: 50 mg via ORAL
  Filled 2015-02-12: qty 2

## 2015-02-12 MED ORDER — OXYCODONE-ACETAMINOPHEN 5-325 MG PO TABS: 1.0000 | ORAL_TABLET | Freq: Three times a day (TID) | ORAL | Status: DC | PRN

## 2015-02-12 NOTE — ED Provider Notes (Signed)
CSN: [id number]     Arrival date & time 02/12/15  2013 History  This chart was scribed for Gwyneth Sprout, MD by Roxy Cedar, ED Scribe. This patient was seen in room MH10/MH10 and the patient's care was started at 8:32 PM.   Chief Complaint  Patient presents with  . Pain over Implanted Defibulator site   HPI  HPI Comments: [name] Small is a 24 y.o. female with a PMHx of hemodialysis, hypertension, pulmonary embolism, CHF, cardiomyopathy, dysrhythmia, anemia, transfusion of packed RBCs, polycystic kidney disease, nephrectomy, AV fistula placement, kidney transplant and adenoidectomy, who presents to the Emergency Department complaining of constant sharp pain to right upper chest onset 2 days ago. Patient states that she had a defibrillator inserted 1 month ago due to CHF by [facility] in [place]. She has a follow up appointment in June.  She currently describes her pain as a "pulling" sensation states it feels "like the defibrillator is placed uncomfortably in chest". She states that she fell down a few steps a few days after defibrillator insertion that did not cause any harm to defibrillator. She states that she has had similar pain in the past but denies it being as persistent as current episode. She reports associated mild swelling to affected area due to not having her dialysis. Patient's most recent dialysis treatment was 4 days ago because she missed her thurs dialysis but plans on going tomorrow or sunday. She denies associated shortness of breath.  Past Medical History  Diagnosis Date  . Hemodialysis patient   . Hypertension   . Pulmonary emboli 01/2012    Bilateral, moderate clot burden, areas of pulmonary infarction and central necrosis  . CHF (congestive heart failure)   . Cardiomyopathy   . Dysrhythmia     at times per pt.  . Anemia   . H/O transfusion of packed red blood cells   . End stage renal disease     s/p cadaveric renal transplant 07/2007 and  transplant failure 08/2011, then transplant nephrectomy 08/2011  . Polycystic kidney disease   . Cellulitis and abscess of face 03/22/2013   Past Surgical History  Procedure Laterality Date  . Nephrectomy    . Av fistula placement    . Kidney transplant  2008    failed  . Tonsillectomy      as a child.  . Adenoidectomy    . Incision and drainage abscess Right 03/21/2013    Procedure: INCISION AND DRAINAGE RIGHT CHEEK ABSCESS REMOVAL OF FOREIGN BODY;  Surgeon: Serena Colonel, MD;  Location: [facility] OR;  Service: ENT;  Laterality: Right;   Family History  Problem Relation Age of Onset  . Polycystic kidney disease Father    History  Substance Use Topics  . Smoking status: Former Smoker -- 0.20 packs/day    Quit date: 05/06/2012  . Smokeless tobacco: Never Used  . Alcohol Use: No   OB History    No data available     Review of Systems  A complete 10 system review of systems was obtained and all systems are negative except as noted in the HPI and PMH.    Allergies  Tramadol; Morphine and related; and Vicodin  Home Medications   Prior to Admission medications   Medication Sig Start Date End Date Taking? Authorizing Provider  calcium acetate (PHOSLO) 667 MG capsule Take 2 capsules (1,334 mg total) by mouth 3 (three) times daily with meals. Take 667mg  with snacks 05/01/13   [name], MD  carvedilol (COREG) 6.25  MG tablet Take 1 tablet (6.25 mg total) by mouth 2 (two) times daily. 01/29/15   Joseph Art, DO  famotidine (PEPCID) 20 MG tablet Take 20 mg by mouth daily as needed. For heartburn 03/24/13   Elease Etienne, MD  isosorbide dinitrate (ISORDIL) 10 MG tablet Take 10 mg by mouth 3 (three) times daily. 01/19/15 01/19/16  Historical Provider, MD  lisinopril (PRINIVIL,ZESTRIL) 2.5 MG tablet Take 1 tablet (2.5 mg total) by mouth 2 (two) times daily. 01/29/15 01/29/16  Joseph Art, DO  LORazepam (ATIVAN) 1 MG tablet Take 1 tablet (1 mg total) by mouth See admin instructions. On dialysis  days, before dialysis 01/29/15   Joseph Art, DO  multivitamin (RENA-VIT) TABS tablet Take 1 tablet by mouth every morning.    Historical Provider, MD  oxyCODONE-acetaminophen (PERCOCET/ROXICET) 5-325 MG per tablet Take 1 tablet by mouth every 4 (four) hours as needed for severe pain. 01/29/15   Joseph Art, DO  sertraline (ZOLOFT) 100 MG tablet Take 100 mg by mouth daily.    Historical Provider, MD   Triage Vitals: BP 140/100 mmHg  Pulse 95  Temp(Src) 98.6 F (37 C) (Oral)  Resp 20  Ht  (1.6 m)  Wt 136 lb (61.689 kg)  BMI 24.10 kg/m2  SpO2 100%  LMP 08/23/2014 (Approximate)  Physical Exam  Constitutional: She is oriented to person, place, and time. She appears well-developed and well-nourished. No distress.  HENT:  Head: Normocephalic and atraumatic.  Eyes: Conjunctivae and EOM are normal.  Neck: Neck supple. No tracheal deviation present.  Cardiovascular: Normal rate, regular rhythm, normal heart sounds and intact distal pulses.  Exam reveals no friction rub.   No murmur heard. Defibrillator present in right upper chest wall. Well healed. No noted hematoma, swelling or ecchymosis around device.  Pulmonary/Chest: Effort normal. No respiratory distress. She has no wheezes. She has no rales. She exhibits tenderness.  Breath sounds clear.  Musculoskeletal: Normal range of motion. She exhibits edema.  Mild peripheral edema in bilateral lower extremities and periorbital.   Neurological: She is alert and oriented to person, place, and time.  Skin: Skin is warm and dry.  Psychiatric: She has a normal mood and affect. Her behavior is normal.  Nursing note and vitals reviewed.  ED Course  Procedures (including critical care time)  DIAGNOSTIC STUDIES: Oxygen Saturation is 100% on RA, normal by my interpretation.    COORDINATION OF CARE: 8:37 PM- Discussed plans to order diagnostic CXR, EKG and lab work. Pt advised of plan for treatment and pt agrees.   Labs Review Labs  Reviewed  BASIC METABOLIC PANEL - Abnormal; Notable for the following:    BUN 103 (*)    Creatinine, Ser 10.77 (*)    GFR calc non Af Amer 4 (*)    GFR calc Af Amer 5 (*)    All other components within normal limits  CBC WITH DIFFERENTIAL/PLATELET - Abnormal; Notable for the following:    RBC 3.58 (*)    Hemoglobin 10.6 (*)    HCT 32.4 (*)    RDW 17.5 (*)    All other components within normal limits    Imaging Review Dg Chest 2 View  02/12/2015   CLINICAL DATA:  Two day history of right-sided chest pain at the site of her pacemaker generator.  EXAM: CHEST  2 VIEW  COMPARISON:  02/03/2017 and earlier.  FINDINGS: Cardiac silhouette markedly enlarged but stable. Hilar and mediastinal contours otherwise unremarkable. Right subclavian single  lead transvenous pacemaker unchanged and appears intact. No soft tissue swelling over the generator pack on the lateral image. Linear scarring in the left lower lobe, unchanged dating back to the November, 2015. Lungs otherwise clear. Bronchovascular markings normal. Pulmonary vascularity normal. No visible pleural effusions. No pneumothorax. Visualized bony thorax intact. IVC filter noted.  IMPRESSION: Stable marked cardiomegaly. Stable linear scarring in the left lower lobe. No acute cardiopulmonary disease.   Electronically Signed   By: Hulan Saas M.D.   On: 02/12/2015 21:43     EKG Interpretation   Date/Time:  Friday February 12 2015 56:21:30 EDT Ventricular Rate:  94 PR Interval:  278 QRS Duration: 82 QT Interval:  342 QTC Calculation: 427 R Axis:   94 Text Interpretation:  Sinus rhythm with 1st degree A-V block Right atrial  enlargement Rightward axis ST \\T \ T wave abnormality, consider  inferolateral ischemia No significant change since last tracing Confirmed  by Anitra Lauth  MD, Alphonzo Lemmings ([id number]) on 02/12/2015 8:26:36 PM     MDM   Final diagnoses:  Chest wall pain  Pain in pacemaker pocket     patient presenting due to a complaint that  her pacemaker has been hurting for the last 2 days. She denies any recent trauma , or complication with insertion. It was inserted approximately one month ago. Patient denies any notable swelling , bleeding or bruising around the device. She denies any left chest pain, palpitations or shortness of breath. Patient is a dialysis patient and received this due to CHF. Her last dialysis was on Tuesday 4 days prior to arrival. She missed her dialysis yesterday and states she plans on getting it either Saturday or Sunday. She's had some mild swelling in her lower extremities which she states is due to not having her dialysis. Patient denies taking any anticoagulation at this time. No fever or drainage from the insertion site.  CXR without evidence of fluid collection.  Labs unremarkable.  Pt  Encouraged to follow-up with cardiologist to placed the pacemaker on Monday if pain continues.  I personally performed the services described in this documentation, which was scribed in my presence.  The recorded information has been reviewed and considered.   Gwyneth Sprout, MD 02/12/15 [phone]

## 2015-02-12 NOTE — ED Notes (Signed)
Gave pt ice water °

## 2015-02-12 NOTE — ED Notes (Signed)
EDP states to obtain labs by Arterial Stick to prevent excessive needle sticks.

## 2015-02-12 NOTE — Discharge Instructions (Signed)

## 2015-02-12 NOTE — ED Notes (Signed)
Attempt IV start to draw blood in right arm without success.

## 2015-02-12 NOTE — ED Notes (Signed)
Pain over her implanted defibulator insertion site right chest since this am. She does not think the devise fired.

## 2015-02-21 ENCOUNTER — Encounter (HOSPITAL_BASED_OUTPATIENT_CLINIC_OR_DEPARTMENT_OTHER): Payer: Self-pay | Admitting: Emergency Medicine

## 2015-02-21 ENCOUNTER — Inpatient Hospital Stay (HOSPITAL_COMMUNITY): Payer: Medicare Other

## 2015-02-21 ENCOUNTER — Inpatient Hospital Stay (HOSPITAL_BASED_OUTPATIENT_CLINIC_OR_DEPARTMENT_OTHER)
Admission: EM | Admit: 2015-02-21 | Discharge: 2015-02-25 | DRG: 291 | Disposition: A | Discharge status: Home / Self Care | Payer: Medicare Other | Attending: Internal Medicine | Admitting: Internal Medicine

## 2015-02-21 DIAGNOSIS — I5022 Chronic systolic (congestive) heart failure: Secondary | ICD-10-CM | POA: Diagnosis not present

## 2015-02-21 DIAGNOSIS — Z885 Allergy status to narcotic agent status: Secondary | ICD-10-CM | POA: Diagnosis not present

## 2015-02-21 DIAGNOSIS — Z87891 Personal history of nicotine dependence: Secondary | ICD-10-CM | POA: Diagnosis not present

## 2015-02-21 DIAGNOSIS — Z86711 Personal history of pulmonary embolism: Secondary | ICD-10-CM

## 2015-02-21 DIAGNOSIS — R0781 Pleurodynia: Secondary | ICD-10-CM

## 2015-02-21 DIAGNOSIS — Z9119 Patient's noncompliance with other medical treatment and regimen: Secondary | ICD-10-CM | POA: Diagnosis present

## 2015-02-21 DIAGNOSIS — Z888 Allergy status to other drugs, medicaments and biological substances status: Secondary | ICD-10-CM | POA: Diagnosis not present

## 2015-02-21 DIAGNOSIS — R079 Chest pain, unspecified: Secondary | ICD-10-CM | POA: Diagnosis not present

## 2015-02-21 DIAGNOSIS — Q613 Polycystic kidney, unspecified: Secondary | ICD-10-CM

## 2015-02-21 DIAGNOSIS — I429 Cardiomyopathy, unspecified: Secondary | ICD-10-CM | POA: Diagnosis present

## 2015-02-21 DIAGNOSIS — D631 Anemia in chronic kidney disease: Secondary | ICD-10-CM | POA: Diagnosis present

## 2015-02-21 DIAGNOSIS — E875 Hyperkalemia: Secondary | ICD-10-CM | POA: Diagnosis present

## 2015-02-21 DIAGNOSIS — Z95810 Presence of automatic (implantable) cardiac defibrillator: Secondary | ICD-10-CM | POA: Diagnosis not present

## 2015-02-21 DIAGNOSIS — I5023 Acute on chronic systolic (congestive) heart failure: Secondary | ICD-10-CM | POA: Diagnosis present

## 2015-02-21 DIAGNOSIS — R188 Other ascites: Secondary | ICD-10-CM | POA: Diagnosis present

## 2015-02-21 DIAGNOSIS — Z992 Dependence on renal dialysis: Secondary | ICD-10-CM

## 2015-02-21 DIAGNOSIS — E8770 Fluid overload, unspecified: Secondary | ICD-10-CM

## 2015-02-21 DIAGNOSIS — I5021 Acute systolic (congestive) heart failure: Secondary | ICD-10-CM | POA: Diagnosis not present

## 2015-02-21 DIAGNOSIS — Z940 Kidney transplant status: Secondary | ICD-10-CM | POA: Diagnosis not present

## 2015-02-21 DIAGNOSIS — R140 Abdominal distension (gaseous): Secondary | ICD-10-CM

## 2015-02-21 DIAGNOSIS — N186 End stage renal disease: Secondary | ICD-10-CM | POA: Diagnosis present

## 2015-02-21 DIAGNOSIS — I120 Hypertensive chronic kidney disease with stage 5 chronic kidney disease or end stage renal disease: Secondary | ICD-10-CM | POA: Diagnosis present

## 2015-02-21 HISTORY — DX: Sickle-cell disease without crisis: D57.1

## 2015-02-21 LAB — CBC
HCT: 30.5 % — ABNORMAL LOW (ref 36.0–46.0)
HCT: 32.7 % — ABNORMAL LOW (ref 36.0–46.0)
HEMOGLOBIN: 10.1 g/dL — AB (ref 12.0–15.0)
Hemoglobin: 10.9 g/dL — ABNORMAL LOW (ref 12.0–15.0)
MCH: 29.4 pg (ref 26.0–34.0)
MCH: 29.7 pg (ref 26.0–34.0)
MCHC: 33.1 g/dL (ref 30.0–36.0)
MCHC: 33.3 g/dL (ref 30.0–36.0)
MCV: 88.9 fL (ref 78.0–100.0)
MCV: 89.1 fL (ref 78.0–100.0)
Platelets: ADEQUATE 10*3/uL (ref 150–400)
Platelets: 168 10*3/uL (ref 150–400)
RBC: 3.43 MIL/uL — AB (ref 3.87–5.11)
RBC: 3.67 MIL/uL — ABNORMAL LOW (ref 3.87–5.11)
RDW: 17 % — ABNORMAL HIGH (ref 11.5–15.5)
RDW: 17.5 % — AB (ref 11.5–15.5)
WBC: 6.8 10*3/uL (ref 4.0–10.5)
WBC: 9.6 10*3/uL (ref 4.0–10.5)

## 2015-02-21 LAB — COMPREHENSIVE METABOLIC PANEL WITH GFR
ALT: 12 U/L — ABNORMAL LOW (ref 14–54)
AST: 16 U/L (ref 15–41)
Albumin: 3.5 g/dL (ref 3.5–5.0)
Alkaline Phosphatase: 236 U/L — ABNORMAL HIGH (ref 38–126)
Anion gap: 16 — ABNORMAL HIGH (ref 5–15)
BUN: 117 mg/dL — ABNORMAL HIGH (ref 6–20)
CO2: 17 mmol/L — ABNORMAL LOW (ref 22–32)
Calcium: 9.3 mg/dL (ref 8.9–10.3)
Chloride: 103 mmol/L (ref 101–111)
Creatinine, Ser: 14.1 mg/dL — ABNORMAL HIGH (ref 0.44–1.00)
GFR calc Af Amer: 4 mL/min — ABNORMAL LOW
GFR calc non Af Amer: 3 mL/min — ABNORMAL LOW
Glucose, Bld: 95 mg/dL (ref 70–99)
Potassium: 5.5 mmol/L — ABNORMAL HIGH (ref 3.5–5.1)
Sodium: 136 mmol/L (ref 135–145)
Total Bilirubin: 0.5 mg/dL (ref 0.3–1.2)
Total Protein: 7.7 g/dL (ref 6.5–8.1)

## 2015-02-21 LAB — CREATININE, SERUM
CREATININE: 14.56 mg/dL — AB (ref 0.44–1.00)
GFR calc Af Amer: 4 mL/min — ABNORMAL LOW (ref >60)
GFR, EST NON AFRICAN AMERICAN: 3 mL/min — AB (ref >60)

## 2015-02-21 LAB — MAGNESIUM: MAGNESIUM: 2.2 mg/dL (ref 1.7–2.4)

## 2015-02-21 LAB — TROPONIN I
TROPONIN I: 0.3 ng/mL — AB (ref <0.031)
TROPONIN I: 0.25 ng/mL — AB (ref <0.031)

## 2015-02-21 MED ORDER — SODIUM CHLORIDE 0.9 % IV SOLN
1.0000 g | Freq: Once | INTRAVENOUS | Status: AC
  Administered 2015-02-21: 1 g via INTRAVENOUS
  Filled 2015-02-21: qty 10

## 2015-02-21 MED ORDER — DIPHENHYDRAMINE HCL 50 MG/ML IJ SOLN
12.5000 mg | Freq: Once | INTRAMUSCULAR | Status: AC
  Administered 2015-02-21: 12.5 mg via INTRAVENOUS
  Filled 2015-02-21: qty 1

## 2015-02-21 MED ORDER — ALTEPLASE 2 MG IJ SOLR
2.0000 mg | Freq: Once | INTRAMUSCULAR | Status: AC | PRN
  Filled 2015-02-21: qty 2

## 2015-02-21 MED ORDER — NEPRO/CARBSTEADY PO LIQD: 237.0000 mL | ORAL | Status: DC | PRN

## 2015-02-21 MED ORDER — METHYLPREDNISOLONE SODIUM SUCC 125 MG IJ SOLR
INTRAMUSCULAR | Status: AC
  Filled 2015-02-21: qty 2

## 2015-02-21 MED ORDER — LIDOCAINE HCL (PF) 1 % IJ SOLN: 5.0000 mL | INTRAMUSCULAR | Status: DC | PRN

## 2015-02-21 MED ORDER — HEPARIN SODIUM (PORCINE) 1000 UNIT/ML DIALYSIS
1000.0000 [IU] | INTRAMUSCULAR | Status: DC | PRN
  Filled 2015-02-21: qty 1

## 2015-02-21 MED ORDER — LISINOPRIL 2.5 MG PO TABS
2.5000 mg | ORAL_TABLET | Freq: Two times a day (BID) | ORAL | Status: DC
  Administered 2015-02-22 – 2015-02-25 (×7): 2.5 mg via ORAL
  Filled 2015-02-21 (×8): qty 1

## 2015-02-21 MED ORDER — SODIUM POLYSTYRENE SULFONATE 15 GM/60ML PO SUSP
30.0000 g | Freq: Once | ORAL | Status: AC
  Administered 2015-02-21: 30 g via ORAL
  Filled 2015-02-21: qty 120

## 2015-02-21 MED ORDER — SODIUM CHLORIDE 0.9 % IV SOLN: 100.0000 mL | INTRAVENOUS | Status: DC | PRN

## 2015-02-21 MED ORDER — DARBEPOETIN ALFA 60 MCG/0.3ML IJ SOSY
60.0000 ug | PREFILLED_SYRINGE | Freq: Once | INTRAMUSCULAR | Status: DC
  Filled 2015-02-21: qty 0.3

## 2015-02-21 MED ORDER — HEPARIN SODIUM (PORCINE) 5000 UNIT/ML IJ SOLN
5000.0000 [IU] | Freq: Three times a day (TID) | INTRAMUSCULAR | Status: DC
  Administered 2015-02-21 – 2015-02-25 (×3): 5000 [IU] via SUBCUTANEOUS
  Filled 2015-02-21 (×13): qty 1

## 2015-02-21 MED ORDER — OXYCODONE-ACETAMINOPHEN 5-325 MG PO TABS
1.0000 | ORAL_TABLET | Freq: Three times a day (TID) | ORAL | Status: DC | PRN
  Administered 2015-02-21 – 2015-02-23 (×2): 1 via ORAL
  Filled 2015-02-21 (×2): qty 1

## 2015-02-21 MED ORDER — SODIUM CHLORIDE 0.9 % IJ SOLN: 3.0000 mL | INTRAMUSCULAR | Status: DC | PRN

## 2015-02-21 MED ORDER — ONDANSETRON HCL 4 MG PO TABS
4.0000 mg | ORAL_TABLET | Freq: Four times a day (QID) | ORAL | Status: DC | PRN
  Administered 2015-02-23: 4 mg via ORAL
  Filled 2015-02-21: qty 1

## 2015-02-21 MED ORDER — RENA-VITE PO TABS
1.0000 | ORAL_TABLET | Freq: Every day | ORAL | Status: DC
  Administered 2015-02-21 – 2015-02-24 (×4): 1 via ORAL
  Filled 2015-02-21 (×7): qty 1

## 2015-02-21 MED ORDER — DIPHENHYDRAMINE HCL 50 MG PO CAPS
50.0000 mg | ORAL_CAPSULE | ORAL | Status: AC
  Administered 2015-02-21: 50 mg via ORAL
  Filled 2015-02-21: qty 1

## 2015-02-21 MED ORDER — SERTRALINE HCL 100 MG PO TABS
100.0000 mg | ORAL_TABLET | Freq: Every day | ORAL | Status: DC
  Administered 2015-02-22 – 2015-02-25 (×4): 100 mg via ORAL
  Filled 2015-02-21 (×4): qty 1

## 2015-02-21 MED ORDER — IOHEXOL 350 MG/ML SOLN
80.0000 mL | Freq: Once | INTRAVENOUS | Status: AC | PRN
  Administered 2015-02-21: 80 mL via INTRAVENOUS

## 2015-02-21 MED ORDER — CALCIUM ACETATE 667 MG PO CAPS
1334.0000 mg | ORAL_CAPSULE | Freq: Three times a day (TID) | ORAL | Status: DC
  Administered 2015-02-22 – 2015-02-25 (×8): 1334 mg via ORAL
  Filled 2015-02-21 (×13): qty 2

## 2015-02-21 MED ORDER — ISOSORBIDE DINITRATE 10 MG PO TABS
10.0000 mg | ORAL_TABLET | Freq: Three times a day (TID) | ORAL | Status: DC
  Administered 2015-02-21 – 2015-02-25 (×12): 10 mg via ORAL
  Filled 2015-02-21 (×13): qty 1

## 2015-02-21 MED ORDER — ACETAMINOPHEN 650 MG RE SUPP: 650.0000 mg | Freq: Four times a day (QID) | RECTAL | Status: DC | PRN

## 2015-02-21 MED ORDER — ONDANSETRON HCL 4 MG/2ML IJ SOLN: 4.0000 mg | Freq: Four times a day (QID) | INTRAMUSCULAR | Status: DC | PRN

## 2015-02-21 MED ORDER — PENTAFLUOROPROP-TETRAFLUOROETH EX AERO: 1.0000 "application " | INHALATION_SPRAY | CUTANEOUS | Status: DC | PRN

## 2015-02-21 MED ORDER — DIPHENHYDRAMINE HCL 50 MG/ML IJ SOLN: 50.0000 mg | INTRAMUSCULAR | Status: AC

## 2015-02-21 MED ORDER — HYDROMORPHONE HCL 1 MG/ML IJ SOLN
1.0000 mg | Freq: Once | INTRAMUSCULAR | Status: AC
  Administered 2015-02-21: 1 mg via INTRAVENOUS
  Filled 2015-02-21: qty 1

## 2015-02-21 MED ORDER — DIPHENHYDRAMINE HCL 50 MG/ML IJ SOLN
25.0000 mg | Freq: Once | INTRAMUSCULAR | Status: DC | PRN
  Filled 2015-02-21: qty 1

## 2015-02-21 MED ORDER — ALBUTEROL SULFATE (2.5 MG/3ML) 0.083% IN NEBU: 2.5000 mg | INHALATION_SOLUTION | RESPIRATORY_TRACT | Status: DC | PRN

## 2015-02-21 MED ORDER — ASPIRIN EC 81 MG PO TBEC
81.0000 mg | DELAYED_RELEASE_TABLET | Freq: Every day | ORAL | Status: DC
  Administered 2015-02-22 – 2015-02-25 (×4): 81 mg via ORAL
  Filled 2015-02-21 (×5): qty 1

## 2015-02-21 MED ORDER — HYDROMORPHONE HCL 1 MG/ML IJ SOLN
1.0000 mg | INTRAMUSCULAR | Status: DC | PRN
  Administered 2015-02-21 – 2015-02-25 (×20): 1 mg via INTRAVENOUS
  Filled 2015-02-21 (×19): qty 1

## 2015-02-21 MED ORDER — CARVEDILOL 6.25 MG PO TABS
6.2500 mg | ORAL_TABLET | Freq: Two times a day (BID) | ORAL | Status: DC
  Administered 2015-02-21 – 2015-02-25 (×8): 6.25 mg via ORAL
  Filled 2015-02-21 (×9): qty 1

## 2015-02-21 MED ORDER — ACETAMINOPHEN 325 MG PO TABS: 650.0000 mg | ORAL_TABLET | Freq: Four times a day (QID) | ORAL | Status: DC | PRN

## 2015-02-21 MED ORDER — LORAZEPAM 1 MG PO TABS
1.0000 mg | ORAL_TABLET | ORAL | Status: DC
  Administered 2015-02-23: 1 mg via ORAL
  Filled 2015-02-21: qty 1

## 2015-02-21 MED ORDER — SODIUM CHLORIDE 0.9 % IJ SOLN
3.0000 mL | Freq: Two times a day (BID) | INTRAMUSCULAR | Status: DC
  Administered 2015-02-22 – 2015-02-23 (×2): 3 mL via INTRAVENOUS

## 2015-02-21 MED ORDER — HEPARIN SODIUM (PORCINE) 1000 UNIT/ML DIALYSIS
20.0000 [IU]/kg | INTRAMUSCULAR | Status: DC | PRN
  Filled 2015-02-21: qty 2

## 2015-02-21 MED ORDER — LIDOCAINE-PRILOCAINE 2.5-2.5 % EX CREA: 1.0000 "application " | TOPICAL_CREAM | CUTANEOUS | Status: DC | PRN

## 2015-02-21 MED ORDER — FAMOTIDINE 20 MG PO TABS
20.0000 mg | ORAL_TABLET | Freq: Every day | ORAL | Status: DC
  Administered 2015-02-22 – 2015-02-25 (×4): 20 mg via ORAL
  Filled 2015-02-21 (×4): qty 1

## 2015-02-21 MED ORDER — GUAIFENESIN-DM 100-10 MG/5ML PO SYRP
5.0000 mL | ORAL_SOLUTION | ORAL | Status: DC | PRN
  Filled 2015-02-21: qty 5

## 2015-02-21 MED ORDER — METHYLPREDNISOLONE SODIUM SUCC 125 MG IJ SOLR
125.0000 mg | INTRAMUSCULAR | Status: AC
  Administered 2015-02-21: 125 mg via INTRAVENOUS

## 2015-02-21 MED ORDER — SODIUM CHLORIDE 0.9 % IJ SOLN
3.0000 mL | Freq: Two times a day (BID) | INTRAMUSCULAR | Status: DC
  Administered 2015-02-22 – 2015-02-25 (×7): 3 mL via INTRAVENOUS

## 2015-02-21 MED ORDER — SODIUM CHLORIDE 0.9 % IV SOLN: 250.0000 mL | INTRAVENOUS | Status: DC | PRN

## 2015-02-21 NOTE — ED Notes (Addendum)
Patient reports chest pain which began last night.  Reports she is a hemodialysis patient and last received dialysis on 02/09/15.  Reports normally she is scheduled on Tuesday, Thursday and Saturday but is "having some issues right now".  Reports she is back and forth from here to [place]. Reports she believes she has too much fluid on.  Patient has AVF in the left upper arm.  Reports she normally receives dialysis in [place].

## 2015-02-21 NOTE — H&P (Signed)
PATIENT DETAILS Name: [name] Small Age: 24 y.o. Sex: female Date of Birth: March 28, 1991 Admit Date: 02/21/2015 DIX:[id number], Nathaniel Man Referring Physician:Dr Wofford   CHIEF COMPLAINT:  Right-sided chest pain since yesterday Abdominal distention with leg edema for past one week  HPI: [name] Small is a 24 y.o. female with a Past Medical History of ESRD on hemodialysis (claims last hemodialysis was on 4/26 to me-claimed to the EDP last dialysis was 4/19), chronic systolic heart failure status post AICD placement, history of pulmonary embolism 2 years back-no longer on anticoagulation who presents today with the above noted complaint. Per patient, she has been going back and forth from [place] to [place], she normally gets all of her medical care including dialysis and [facility]. She goes to dialysis on Tuesday as, Thursdays and Saturdays. She is here [place] for family issues. She apparently claims that her last dialysis was on 4/26-and since yesterday she has developed is constant right-sided pleuritic chest pain. She describes the pain as sharp, around 8/10 at its worst without any radiation. There is no relieving factors, it is obviously aggravated by deep breath. She also claims to have significant abdominal swelling, leg swelling and mild exertional dyspnea. With these complaints he presented to [facility], subsequently hospitalist service was consulted for admission and to arrange for hemodialysis. Currently, she denies any headache, fever, nausea, vomiting or diarrhea.   ALLERGIES:   Allergies  Allergen Reactions  . Tramadol Anaphylaxis  . Morphine And Related Itching    Ok with oxycodone  . Vicodin [Hydrocodone-Acetaminophen] Other (See Comments)    unknown    PAST MEDICAL HISTORY: Past Medical History  Diagnosis Date  . Hemodialysis patient   . Hypertension   . Pulmonary emboli 01/2012    Bilateral, moderate clot burden, areas of pulmonary  infarction and central necrosis  . CHF (congestive heart failure)   . Cardiomyopathy   . Dysrhythmia     at times per pt.  . Anemia   . H/O transfusion of packed red blood cells   . 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 Polycystic kidney disease   . Cellulitis and abscess of face 03/22/2013    PAST SURGICAL HISTORY: Past Surgical History  Procedure Laterality Date  . Nephrectomy    . Av fistula placement    . Kidney transplant  2008    failed  . Tonsillectomy      as a child.  . Adenoidectomy    . Incision and drainage abscess Right 03/21/2013    Procedure: INCISION AND DRAINAGE RIGHT CHEEK ABSCESS REMOVAL OF FOREIGN BODY;  Surgeon: Serena Colonel, MD;  Location: [facility];  Service: ENT;  Laterality: Right;  . Implantable cardioverter defibrillator implant Right 12/2014    MEDICATIONS AT HOME: Prior to Admission medications   Medication Sig Start Date End Date Taking? Authorizing Provider  calcium acetate (PHOSLO) 667 MG capsule Take 2 capsules (1,334 mg total) by mouth 3 (three) times daily with meals. Take 667mg  with snacks 05/01/13   [name], MD  carvedilol (COREG) 6.25 MG tablet Take 1 tablet (6.25 mg total) by mouth 2 (two) times daily. 01/29/15   Joseph Art, DO  famotidine (PEPCID) 20 MG tablet Take 20 mg by mouth daily as needed. For heartburn 03/24/13   Elease Etienne, MD  isosorbide dinitrate (ISORDIL) 10 MG tablet Take 10 mg by  mouth 3 (three) times daily. 01/19/15 01/19/16  Historical Provider, MD  lisinopril (PRINIVIL,ZESTRIL) 2.5 MG tablet Take 1 tablet (2.5 mg total) by mouth 2 (two) times daily. 01/29/15 01/29/16  Joseph Art, DO  LORazepam (ATIVAN) 1 MG tablet Take 1 tablet (1 mg total) by mouth See admin instructions. On dialysis days, before dialysis 01/29/15   Joseph Art, DO  multivitamin (RENA-VIT) TABS tablet Take 1 tablet by mouth every morning.    Historical  Provider, MD  oxyCODONE-acetaminophen (PERCOCET/ROXICET) 5-325 MG per tablet Take 1 tablet by mouth every 8 (eight) hours as needed for severe pain. 02/12/15   Gwyneth Sprout, MD  sertraline (ZOLOFT) 100 MG tablet Take 100 mg by mouth daily.    Historical Provider, MD    FAMILY HISTORY: Family History  Problem Relation Age of Onset  . Polycystic kidney disease Father     SOCIAL HISTORY:  reports that she quit smoking about 2 years ago. She has never used smokeless tobacco. She reports that she does not drink alcohol or use illicit drugs. Lives at: Home Mobility: Independent  REVIEW OF SYSTEMS:  Constitutional:   No  weight loss, night sweats,  Fevers, chills, fatigue.  HEENT:    No headaches, Dysphagia,Tooth/dental problems,Sore throat,   Cardio-vascular: No chest pain,Orthopnea, PND,lower extremity edema, anasarca, palpitations  GI:  No heartburn, indigestion, abdominal pain, nausea, vomiting, diarrhea, melena or hematochezia  Resp: No shortness of breath, cough, hemoptysis,plueritic chest pain.   Skin:  No rash or lesions.  GU:  No dysuria, change in color of urine, no urgency or frequency.  No flank pain.  Musculoskeletal: No joint pain or swelling.  No decreased range of motion.  No back pain.  Endocrine: No heat intolerance, no cold intolerance, no polyuria, no polydipsia  Psych: No change in mood or affect. No depression or anxiety.  No memory loss.   PHYSICAL EXAM: Blood pressure 145/94, pulse 86, temperature 98.3 F (36.8 C), temperature source Oral, resp. rate 17, height  (1.6 m), weight 63.504 kg (140 lb), last menstrual period 08/23/2014, SpO2 99 %.  General appearance :Awake, alert, not in any distress. Speech Clear. Not toxic Looking HEENT: Atraumatic and Normocephalic, pupils equally reactive to light and accomodation Neck: supple, No cervical lymphadenopathy.  Chest:Good air entry bilaterally, no added sounds  CVS: S1 S2 regular, no  murmurs.  Abdomen: Bowel sounds present, Non tender, mildly distended with some dullness in the flanks, but not tense. There is no rigidity, guarding or rebound. Extremities: B/L Lower Ext shows 1+ edema, both legs are warm to touch Neurology:  Non focal Skin:No Rash Wounds:N/A  LABS ON ADMISSION:   Recent Labs  02/21/15 1310  NA 136  K 5.5*  CL 103  CO2 17*  GLUCOSE 95  BUN 117*  CREATININE 14.10*  CALCIUM 9.3  MG 2.2    Recent Labs  02/21/15 1310  AST 16  ALT 12*  ALKPHOS 236*  BILITOT 0.5  PROT 7.7  ALBUMIN 3.5   No results for input(s): LIPASE, AMYLASE in the last 72 hours.  Recent Labs  02/21/15 1310  WBC 6.8  HGB 10.1*  HCT 30.5*  MCV 88.9  PLT PLATELETS APPEAR ADEQUATE    Recent Labs  02/21/15 1310  TROPONINI 0.30*   No results for input(s): DDIMER in the last 72 hours. Invalid input(s): POCBNP   RADIOLOGIC STUDIES ON ADMISSION: No results found.   EKG: Independently reviewed. Sinus rhythm-T wave inversions in leads 2, 3, aVF, V5, V6-old  woman compared to prior EKG  ASSESSMENT AND PLAN: Present on Admission:  . Acute on chronic systolic heart failure with Volume overload in a setting of missed dialysis: Monitor in telemetry, EF per last echocardiogram (February 2016 in care everywhere) around 10%. Have consulted nephrology-spoke with Dr. Merry Lofty from edema patient not hypoxic-nephrology will evaluate-but tentatively plans are for dialysis tomorrow. Monitor closely overnight.  . Pleuritic chest pain: Claims to have ongoing pleuritic right-sided chest pain-given prior history of pulmonary embolism- check a CT angiogram of the chest. She claims to have itchiness after getting IV contrast-but no rash/hives/SOB/swelling-Will ask RN to give her Benadryl before she gets CT scan.  . Mild hyperkalemia: Will give 1 dose of Kayexalate. No concerning EKG findings. Resume lisinopril tomorrow after dialysis  . Mildly elevated troponin levels:  Suspect false-positive elevation in the setting of chronic disease. Her pain is right-sided and not consistent with ACS. Will cycle cardiac enzymes and monitor in telemetry. If troponins were to significantly increase then would consult cardiology. Furthermore, review of labs from her prior admission-shows troponin elevation as well  . Hypertension: Resume antihypertensives, follow and titrate accordingly  . ESRD: Nephrology consulted for dialysis.  . Mild ascites: On percussion has some dullness in the flanks-patient was inquiring about paracentesis-belly is not tense, doubt she needs paracentesis at this time. Suspect etiology of ascites to be cardiac.   Further plan will depend as patient's clinical course evolves and further radiologic and laboratory data become available. Patient will be monitored closely.  Above noted plan was discussed with patient, she was in agreement.   CONSULTS: Nephrology  DVT Prophylaxis: Prophylactic Heparin  Code Status: Full Code  Disposition Plan:  Discharge back home in 1-2 days   Total time spent for admission equals 45 minutes.  [facility] Pager [phone]  If 7PM-7AM, please contact night-coverage [email or web address] Password [facility] 02/21/2015, 6:14 PM

## 2015-02-21 NOTE — Consult Note (Signed)
Reason for Consult: To manage dialysis and dialysis related needs Referring Physician: Danyah Guastella is an 24 y.o. 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 Her assigned dialysis unit is in [place] is a [employer] account planner on [employer] but she states she hasn't been there for a month. It seems that she's been getting a lot of in-hospital dialysis- she was actually here early in April. She is very vague about when her  last dialysis was- she needs a number of excuses including car trouble and now financial difficulties. She also has a severe cardiomyopathy with ejection fraction of 5-10% status post ICD. She presents to the hospital today with right-sided chest discomfort. Her blood pressure is high but she is not hypoxic. She is 10 kg over her dry weight. Her potassium is 5.5 and her BUN is close to 120. She states she's not really feeling that well right now.    Dialyzes at [facility] unit  EDW 53 kg. HD Bath unknown, Dialyzer unknown, Heparin unknown. Access left upper arm AV fistula.  Past Medical History  Diagnosis Date  . Hemodialysis patient   . Hypertension   . Pulmonary emboli 01/2012    Bilateral, moderate clot burden, areas of pulmonary infarction and central necrosis  . CHF (congestive heart failure)   . Cardiomyopathy   . Dysrhythmia     at times per pt.  . Anemia   . H/O transfusion of packed red blood cells   . 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 Polycystic kidney disease   . Cellulitis and abscess of face 03/22/2013    Past Surgical History  Procedure Laterality Date  . Nephrectomy    . Av fistula placement    . Kidney transplant  2008    failed  . Tonsillectomy      as a child.  . Adenoidectomy    . Incision and  drainage abscess Right 03/21/2013    Procedure: INCISION AND DRAINAGE RIGHT CHEEK ABSCESS REMOVAL OF FOREIGN BODY;  Surgeon: Izora Gala, MD;  Location: [facility];  Service: ENT;  Laterality: Right;  . Implantable cardioverter defibrillator implant Right 12/2014    Family History  Problem Relation Age of Onset  . Polycystic kidney disease Father     Social History:  reports that she quit smoking about 2 years ago. She has never used smokeless tobacco. She reports that she does not drink alcohol or use illicit drugs.  Allergies:  Allergies  Allergen Reactions  . Tramadol Anaphylaxis  . Iohexol Itching  . Morphine And Related Itching    Ok with oxycodone  . Vicodin [Hydrocodone-Acetaminophen] Other (See Comments)    unknown    Medications: I have reviewed the patient's current medications.    Results for orders placed or performed during the hospital encounter of 02/21/15 (from the past 48 hour(s))  CBC     Status: Abnormal   Collection Time: 02/21/15  1:10 PM  Result Value Ref Range   WBC 6.8 4.0 - 10.5 K/uL   RBC 3.43 (L) 3.87 - 5.11 MIL/uL   Hemoglobin 10.1 (L) 12.0 - 15.0 g/dL   HCT 30.5 (L) 36.0 - 46.0 %   MCV 88.9 78.0 - 100.0 fL   MCH 29.4 26.0 - 34.0 pg  MCHC 33.1 30.0 - 36.0 g/dL   RDW 17.0 (H) 11.5 - 15.5 %   Platelets PLATELETS APPEAR ADEQUATE 150 - 400 K/uL    Comment: LARGE PLATELETS PRESENT  Comprehensive metabolic panel     Status: Abnormal   Collection Time: 02/21/15  1:10 PM  Result Value Ref Range   Sodium 136 135 - 145 mmol/L   Potassium 5.5 (H) 3.5 - 5.1 mmol/L   Chloride 103 101 - 111 mmol/L   CO2 17 (L) 22 - 32 mmol/L   Glucose, Bld 95 70 - 99 mg/dL   BUN 117 (H) 6 - 20 mg/dL    Comment: RESULTS CONFIRMED BY MANUAL DILUTION   Creatinine, Ser 14.10 (H) 0.44 - 1.00 mg/dL   Calcium 9.3 8.9 - 10.3 mg/dL   Total Protein 7.7 6.5 - 8.1 g/dL   Albumin 3.5 3.5 - 5.0 g/dL   AST 16 15 - 41 U/L   ALT 12 (L) 14 - 54 U/L   Alkaline Phosphatase 236 (H) 38 -  126 U/L   Total Bilirubin 0.5 0.3 - 1.2 mg/dL   GFR calc non Af Amer 3 (L) >60 mL/min   GFR calc Af Amer 4 (L) >60 mL/min    Comment: (NOTE) The eGFR has been calculated using the CKD EPI equation. This calculation has not been validated in all clinical situations. eGFR's persistently <90 mL/min signify possible Chronic Kidney Disease.    Anion gap 16 (H) 5 - 15  Troponin I     Status: Abnormal   Collection Time: 02/21/15  1:10 PM  Result Value Ref Range   Troponin I 0.30 (H) <0.031 ng/mL    Comment:        PERSISTENTLY INCREASED TROPONIN VALUES IN THE RANGE OF 0.04-0.49 ng/mL CAN BE SEEN IN:       -UNSTABLE ANGINA       -CONGESTIVE HEART FAILURE       -MYOCARDITIS       -CHEST TRAUMA       -ARRYHTHMIAS       -LATE PRESENTING MYOCARDIAL INFARCTION       -COPD   CLINICAL FOLLOW-UP RECOMMENDED.   Magnesium     Status: None   Collection Time: 02/21/15  1:10 PM  Result Value Ref Range   Magnesium 2.2 1.7 - 2.4 mg/dL    No results found.  ROS: Positive for shortness of breath, pleuritic chest pain, lower extremity edema, increased abdominal girth Blood pressure 145/94, pulse 86, temperature 98.3 F (36.8 C), temperature source Oral, resp. rate 17, height '5\' 3"'  (1.6 m), weight 63.504 kg (140 lb), last menstrual period 08/23/2014, SpO2 99 %. General appearance: alert, distracted and slowed mentation Eyes: conjunctivae/corneas clear. PERRL, EOM's intact. Fundi benign. Resp: diminished breath sounds bibasilar Cardio: S3 present and Distant heart sounds GI: abnormal findings:  ascites Extremities: edema 1+ Neurologic: Alert and oriented X 3, normal strength and tone. Normal symmetric reflexes. Normal coordination and gait Left upper arm AV fistula with good thrill and bruit  Assessment/Plan: 24 year old black female with long-standing end-stage renal disease presenting with essentially noncompliance with dialysis 1 shortness of breath/pleuritic chest pain- workup per primary as  she had had a PE in the past. Given that she is 10 kg over her dry weight probably has something to do with that along with her cardiomyopathy.  2 ESRD: Her normal schedule is TTS via AV fistula in [place]. We really don't know if the last time she had dialysis. She is 10 kg  over her dry weight. I will confer with nursing and possibly get her treatment done this evening 3 Hypertension: She is hypertensive likely secondary to volume overload. She is on Coreg and lisinopril for her cardiomyopathy -  meds will be continued 4. Anemia of ESRD: Hemoglobin of 10.1. Not significantly decreased. I do not know her ESA history. I will check iron stores and put her on a low-dose ESA at this time 5. Metabolic Bone Disease: Is noted to be on PhosLo this will be continued. Not sure she will be here long enough for [place] to check a PTH and modify any vitamin D therapy.  Thank you for this consult, we will follow with you  Alycen Mack A 02/21/2015, 7:42 PM

## 2015-02-21 NOTE — ED Provider Notes (Signed)
CSN: [id number]     Arrival date & time 02/21/15  1155 History   First MD Initiated Contact with Patient 02/21/15 1233     Chief Complaint  Patient presents with  . Chest Pain     (Consider location/radiation/quality/duration/timing/severity/associated sxs/prior Treatment) Patient is a 24 y.o. female presenting with chest pain.  Chest Pain Pain location:  Substernal area Pain quality: sharp   Pain radiates to:  Does not radiate Pain severity:  Severe Onset quality:  Gradual Duration: several days. Timing:  Constant Progression:  Worsening Chronicity:  Recurrent Context comment:  Has not been to dialysis since April 19th, almost two weeks ago.  Relieved by:  Nothing Worsened by:  Nothing tried Associated symptoms comment:  Leg swelling, abdominal swelling, shortness of breath.  "I think I have too much fluid on."   Past Medical History  Diagnosis Date  . Hemodialysis patient   . Hypertension   . Pulmonary emboli 01/2012    Bilateral, moderate clot burden, areas of pulmonary infarction and central necrosis  . CHF (congestive heart failure)   . Cardiomyopathy   . Dysrhythmia     at times per pt.  . Anemia   . H/O transfusion of packed red blood cells   . 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 Polycystic kidney disease   . Cellulitis and abscess of face 03/22/2013   Past Surgical History  Procedure Laterality Date  . Nephrectomy    . Av fistula placement    . Kidney transplant  2008    failed  . Tonsillectomy      as a child.  . Adenoidectomy    . Incision and drainage abscess Right 03/21/2013    Procedure: INCISION AND DRAINAGE RIGHT CHEEK ABSCESS REMOVAL OF FOREIGN BODY;  Surgeon: Serena Colonel, MD;  Location: [facility] OR;  Service: ENT;  Laterality: Right;  . Implantable cardioverter defibrillator implant Right 12/2014   Family History  Problem Relation Age of Onset  .  Polycystic kidney disease Father    History  Substance Use Topics  . Smoking status: Former Smoker -- 0.20 packs/day    Quit date: 05/06/2012  . Smokeless tobacco: Never Used  . Alcohol Use: No   OB History    No data available     Review of Systems  Cardiovascular: Positive for chest pain.  All other systems reviewed and are negative.     Allergies  Tramadol; Morphine and related; and Vicodin  Home Medications   Prior to Admission medications   Medication Sig Start Date End Date Taking? Authorizing Provider  calcium acetate (PHOSLO) 667 MG capsule Take 2 capsules (1,334 mg total) by mouth 3 (three) times daily with meals. Take  with snacks 05/01/13   [name], MD  carvedilol (COREG) 6.25 MG tablet Take 1 tablet (6.25 mg total) by mouth 2 (two) times daily. 01/29/15   Joseph Art, DO  famotidine (PEPCID) 20 MG tablet Take 20 mg by mouth daily as needed. For heartburn 03/24/13   Elease Etienne, MD  isosorbide dinitrate (ISORDIL) 10 MG tablet Take 10 mg by mouth 3 (three) times daily. 01/19/15 01/19/16  Historical Provider, MD  lisinopril (PRINIVIL,ZESTRIL) 2.5 MG tablet Take 1 tablet (2.5 mg total) by mouth 2 (two) times daily. 01/29/15 01/29/16  Joseph Art, DO  LORazepam (ATIVAN) 1 MG tablet Take 1 tablet (1 mg total) by mouth See admin instructions. On  dialysis days, before dialysis 01/29/15   Joseph Art, DO  multivitamin (RENA-VIT) TABS tablet Take 1 tablet by mouth every morning.    Historical Provider, MD  oxyCODONE-acetaminophen (PERCOCET/ROXICET) 5-325 MG per tablet Take 1 tablet by mouth every 8 (eight) hours as needed for severe pain. 02/12/15   Gwyneth Sprout, MD  sertraline (ZOLOFT) 100 MG tablet Take 100 mg by mouth daily.    Historical Provider, MD   BP 148/109 mmHg  Pulse 92  Temp(Src) 98 F (36.7 C) (Oral)  Resp 18  Ht 5\' 3"  (1.6 m)  Wt 140 lb (63.504 kg)  BMI 24.81 kg/m2  SpO2 95%  LMP 08/23/2014 (Approximate) Physical Exam  Constitutional: She  is oriented to person, place, and time. She appears well-developed and well-nourished. No distress.  HENT:  Head: Normocephalic and atraumatic.  Mouth/Throat: Oropharynx is clear and moist.  Eyes: Conjunctivae are normal. Pupils are equal, round, and reactive to light. No scleral icterus.  Neck: Neck supple.  Cardiovascular: Normal rate, regular rhythm, normal heart sounds and intact distal pulses.   No murmur heard. Pulmonary/Chest: Effort normal and breath sounds normal. No stridor. No respiratory distress. She has no rales.  Abdominal: Soft. Bowel sounds are normal. She exhibits distension (mild). There is no tenderness. There is no rigidity and no guarding.  Musculoskeletal: Normal range of motion. She exhibits edema (1+ BLE).  Neurological: She is alert and oriented to person, place, and time.  Skin: Skin is warm and dry. No rash noted.  Psychiatric: She has a normal mood and affect. Her behavior is normal.  Nursing note and vitals reviewed.   ED Course  Procedures (including critical care time) Emergency Ultrasound Study:   Angiocath insertion Performed by: Merrie Roof  Consent: Verbal consent obtained. Risks and benefits: risks, benefits and alternatives were discussed Immediately prior to procedure the correct patient, procedure, equipment, support staff and site/side marked as needed.  Indication: difficult IV access Preparation: Patient was prepped and draped in the usual sterile fashion. Vein Location: right brachial vein was visualized during assessment for potential access sites and was found to be patent/ easily compressed with linear ultrasound.  The needle was visualized with real-time ultrasound and guided into the vein. Gauge: 20g  Image saved and stored.  Normal blood return.  Patient tolerance: Patient tolerated the procedure well with no immediate complications.      Labs Review Labs Reviewed  CBC - Abnormal; Notable for the following:     RBC 3.43 (*)    Hemoglobin 10.1 (*)    HCT 30.5 (*)    RDW 17.0 (*)    All other components within normal limits  COMPREHENSIVE METABOLIC PANEL - Abnormal; Notable for the following:    Potassium 5.5 (*)    CO2 17 (*)    BUN 117 (*)    Creatinine, Ser 14.10 (*)    ALT 12 (*)    Alkaline Phosphatase 236 (*)    GFR calc non Af Amer 3 (*)    GFR calc Af Amer 4 (*)    Anion gap 16 (*)    All other components within normal limits  TROPONIN I - Abnormal; Notable for the following:    Troponin I 0.30 (*)    All other components within normal limits  MAGNESIUM    Imaging Review No results found.   EKG Interpretation   Date/Time:  Sunday Feb 21 2015 11:59:52 EDT Ventricular Rate:  94 PR Interval:  274 QRS Duration: 86  QT Interval:  340 QTC Calculation: 425 R Axis:   33 Text Interpretation:  Sinus rhythm with 1st degree A-V block Possible Left  atrial enlargement T wave abnormality, consider inferior ischemia Abnormal  ECG nonspecific st/t abnormality not as pronounced compared to prior.  Confirmed by [facility]  MD, TREY ([id number]) on 02/21/2015 12:39:27 PM      MDM   Final diagnoses:  Chest pain, unspecified chest pain type  Hyperkalemia  Hypervolemia, unspecified hypervolemia type    24 yo female with chest pain, edema, and shortness of breath.  Has not been to dialysis in 12 days.  Has K of 5.5, no EKG changes.  Has bicarb of 17 and BUN of 117.  Has chronically elevated troponin, but I don't think history or this troponin represent ACS.  I think she needs admission and dialysis.  I spoke with Dr. Cena Benton ([facility]) and Dr. Marisue Humble [facility]).  She will be transferred to [facility].     Blake Divine, MD 02/21/15 [phone]

## 2015-02-21 NOTE — Progress Notes (Signed)
CT scan ordered by MD to rule out Pulmary embolus, per patient history, she itches with CT media. Administered 50 PO benadryl and 125 mg IV at 1906 notified Chris in [place]   [place] A, RN 02/21/2015 7:30 PM

## 2015-02-21 NOTE — Progress Notes (Signed)
New Admission Note:   Arrival Method: [facility] Mental Orientation: Alert and oriented  Telemetry: box 20 IV: Right arm, saline locked  Pain: sternal pain  Tubes: None  Safety Measures: Safety Fall Prevention Plan has been given, discussed and signed 6 [place] Orientation: Patient has been orientated to the room, unit and staff.  Family: none at bedside   Orders have been reviewed and implemented. Will continue to monitor the patient. Call light has been placed within reach and bed alarm has been activated.   Riesa Pope BSN, RN Phone number: [phone]

## 2015-02-21 NOTE — ED Notes (Signed)
Pt request pain med for "sharp chest pain", non-radiating, EDP notified

## 2015-02-21 NOTE — ED Notes (Signed)
Presents w/ CP today, dialysis pt, last treatement mid April.

## 2015-02-21 NOTE — ED Notes (Signed)
Cont on cardiac monitor, all IV fluid meds, placed on alaris infusion pump

## 2015-02-21 NOTE — Progress Notes (Signed)
Pt arrived to 6e20, no attending assigned paged flow manager.   Ok Anis, RN 02/21/2015 5:46 PM

## 2015-02-22 ENCOUNTER — Inpatient Hospital Stay (HOSPITAL_COMMUNITY): Payer: Medicare Other

## 2015-02-22 DIAGNOSIS — R079 Chest pain, unspecified: Secondary | ICD-10-CM

## 2015-02-22 DIAGNOSIS — E8770 Fluid overload, unspecified: Secondary | ICD-10-CM

## 2015-02-22 LAB — IRON AND TIBC
IRON: 37 ug/dL (ref 28–170)
Saturation Ratios: 19 % (ref 10.4–31.8)
TIBC: 192 ug/dL — ABNORMAL LOW (ref 250–450)
UIBC: 155 ug/dL

## 2015-02-22 LAB — FERRITIN: FERRITIN: 576 ng/mL — AB (ref 11–307)

## 2015-02-22 LAB — CBC
HCT: 30.2 % — ABNORMAL LOW (ref 36.0–46.0)
HEMOGLOBIN: 9.8 g/dL — AB (ref 12.0–15.0)
MCH: 28.7 pg (ref 26.0–34.0)
MCHC: 32.5 g/dL (ref 30.0–36.0)
MCV: 88.3 fL (ref 78.0–100.0)
PLATELETS: 163 10*3/uL (ref 150–400)
RBC: 3.42 MIL/uL — AB (ref 3.87–5.11)
RDW: 17.2 % — ABNORMAL HIGH (ref 11.5–15.5)
WBC: 5 10*3/uL (ref 4.0–10.5)

## 2015-02-22 LAB — RENAL FUNCTION PANEL
Albumin: 3.5 g/dL (ref 3.5–5.0)
Anion gap: 17 — ABNORMAL HIGH (ref 5–15)
BUN: 115 mg/dL — ABNORMAL HIGH (ref 6–20)
CALCIUM: 10.1 mg/dL (ref 8.9–10.3)
CHLORIDE: 99 mmol/L — AB (ref 101–111)
CO2: 20 mmol/L — ABNORMAL LOW (ref 22–32)
Creatinine, Ser: 14.74 mg/dL — ABNORMAL HIGH (ref 0.44–1.00)
GFR calc non Af Amer: 3 mL/min — ABNORMAL LOW (ref >60)
GFR, EST AFRICAN AMERICAN: 4 mL/min — AB (ref >60)
Glucose, Bld: 115 mg/dL — ABNORMAL HIGH (ref 70–99)
PHOSPHORUS: 9.9 mg/dL — AB (ref 2.5–4.6)
Potassium: 5.9 mmol/L — ABNORMAL HIGH (ref 3.5–5.1)
Sodium: 136 mmol/L (ref 135–145)

## 2015-02-22 LAB — TROPONIN I
Troponin I: 0.28 ng/mL — ABNORMAL HIGH (ref <0.031)
Troponin I: 0.24 ng/mL — ABNORMAL HIGH (ref <0.031)

## 2015-02-22 LAB — HEPATITIS B SURFACE ANTIGEN: HEP B S AG: NEGATIVE

## 2015-02-22 MED ORDER — DARBEPOETIN ALFA 60 MCG/0.3ML IJ SOSY
PREFILLED_SYRINGE | INTRAMUSCULAR | Status: AC
  Filled 2015-02-22: qty 0.3

## 2015-02-22 MED ORDER — SODIUM CHLORIDE 0.9 % IV SOLN
125.0000 mg | INTRAVENOUS | Status: DC
  Administered 2015-02-23 – 2015-02-25 (×2): 125 mg via INTRAVENOUS
  Filled 2015-02-22 (×4): qty 10

## 2015-02-22 MED ORDER — DARBEPOETIN ALFA 60 MCG/0.3ML IJ SOSY
60.0000 ug | PREFILLED_SYRINGE | Freq: Once | INTRAMUSCULAR | Status: AC
  Administered 2015-02-22: 60 ug via INTRAVENOUS

## 2015-02-22 MED ORDER — HYDROMORPHONE HCL 1 MG/ML IJ SOLN
INTRAMUSCULAR | Status: AC
  Filled 2015-02-22: qty 1

## 2015-02-22 MED ORDER — DIPHENHYDRAMINE HCL 25 MG PO CAPS
25.0000 mg | ORAL_CAPSULE | Freq: Three times a day (TID) | ORAL | Status: DC | PRN
Start: 2015-02-22 — End: 2015-02-25
  Administered 2015-02-22 – 2015-02-25 (×2): 25 mg via ORAL
  Filled 2015-02-22: qty 1

## 2015-02-22 NOTE — Progress Notes (Signed)
[facility] PROGRESS NOTE  [name] Small RUE:[id number] DOB: 1991/05/23 DOA: 02/21/2015 PCP: Ellin Saba  Assessment/Plan: 1-Chest pain: in setting of cardiomyopathy. CT angio negative for PE.  Cardiac enzymes mildly elevated.  Chest pain persist. Will consult cardiology.  Continue with aspirin. Coreg.  Check ECHO. EKG sinus rhythm.   Acute on chronic systolic HF; S/P AICD.  EF per last echocardiogram (February 2016 in care everywhere) around 10%. Continue with lisinopril, isosorbide.   Mild ascites: related to HF, RF. Volume removal with dialysis.   Hypertension: continue with coreg.   ESRD: correction of electrolytes with dialysis.  Received dialysis 5-2.   Code Status: Full code.  Family Communication: care discussed with patient Disposition Plan: remain inpatient   Consultants:  Cardiology  nephrology  Procedures:  ECHO;pending.   Antibiotics:  none  HPI/Subjective: Still complaining of chest pain, constant. Some times associated with deep breath.  She doesn't have outpatient dialysis here in [place]. She follow in [place]. She wants to move back to [place].   Objective: Filed Vitals:   02/22/15 1202  BP: 146/98  Pulse:   Temp:   Resp:     Intake/Output Summary (Last 24 hours) at 02/22/15 1350 Last data filed at 02/22/15 1153  Gross per 24 hour  Intake    110 ml  Output   3100 ml  Net  -2990 ml   Filed Weights   02/21/15 2120 02/22/15 0755 02/22/15 1153  Weight: 67.405 kg (148 lb 9.6 oz) 64.229 kg (141 lb 9.6 oz) 61.1 kg (134 lb 11.2 oz)    Exam:   General:  NAD  Cardiovascular: S 1, S 2 RRR  Respiratory: CTA  Abdomen: BS present, distended.   Musculoskeletal: no edema.   Data Reviewed: Basic Metabolic Panel:  Recent Labs Lab 02/21/15 1310 02/21/15 1926 02/22/15 0036  NA 136  --  136  K 5.5*  --  5.9*  CL 103  --  99*  CO2 17*  --  20*  GLUCOSE 95  --  115*  BUN 117*  --  115*  CREATININE 14.10*  14.56* 14.74*  CALCIUM 9.3  --  10.1  MG 2.2  --   --   PHOS  --   --  9.9*   Liver Function Tests:  Recent Labs Lab 02/21/15 1310 02/22/15 0036  AST 16  --   ALT 12*  --   ALKPHOS 236*  --   BILITOT 0.5  --   PROT 7.7  --   ALBUMIN 3.5 3.5   No results for input(s): LIPASE, AMYLASE in the last 168 hours. No results for input(s): AMMONIA in the last 168 hours. CBC:  Recent Labs Lab 02/21/15 1310 02/21/15 1926 02/22/15 0619  WBC 6.8 9.6 5.0  HGB 10.1* 10.9* 9.8*  HCT 30.5* 32.7* 30.2*  MCV 88.9 89.1 88.3  PLT PLATELETS APPEAR ADEQUATE 168 163   Cardiac Enzymes:  Recent Labs Lab 02/21/15 1310 02/21/15 1926 02/22/15 0036 02/22/15 0619  TROPONINI 0.30* 0.25* 0.28* 0.24*   BNP (last 3 results) No results for input(s): BNP in the last 8760 hours.  ProBNP (last 3 results) No results for input(s): PROBNP in the last 8760 hours.  CBG: No results for input(s): GLUCAP in the last 168 hours.  No results found for this or any previous visit (from the past 240 hour(s)).   Studies: Ct Angio Chest Pe W/cm &/or Wo Cm  02/21/2015   CLINICAL DATA:  Chest pain abdominal pain for 2 days. Hemodialysis  patient.  EXAM: CT ANGIOGRAPHY CHEST WITH CONTRAST  TECHNIQUE: Multidetector CT imaging of the chest was performed using the standard protocol during bolus administration of intravenous contrast. Multiplanar CT image reconstructions and MIPs were obtained to evaluate the vascular anatomy.  CONTRAST:  13mL OMNIPAQUE IOHEXOL 350 MG/ML SOLN  COMPARISON:  CT thorax 12/20/2014  FINDINGS: Mediastinum/Nodes: No filling defects within the pulmonary suggest acute pulmonary embolism. Acute findings aorta great vessels. The heart is enlarged. No pericardial fluid.  Lungs/Pleura: No evidence of pulmonary edema. There is trace right pleural effusion. No infiltrate or pneumothorax.  Upper abdomen: There is small amount of intra-abdominal free fluid. Kidneys are atrophic with multiple mixed density  lesions.  Musculoskeletal: No acute findings of skeleton. There is a pacemaker in the right chest wall.  Review of the MIP images confirms the above findings.  IMPRESSION: 1. No evidence of pulmonary embolism. 2. Cardiomegaly without evidence of pulmonary edema. 3. Small right effusion. 4. Free fluid in the abdomen may relate to heart failure or end-stage renal disease. 5. Nodular kidneys unchanged from prior.   Electronically Signed   By: Genevive Bi M.D.   On: 02/21/2015 20:35    Scheduled Meds: . aspirin EC  81 mg Oral Daily  . calcium acetate  1,334 mg Oral TID WC  . carvedilol  6.25 mg Oral BID  . Darbepoetin Alfa      . famotidine  20 mg Oral Daily  . [START ON 02/23/2015] ferric gluconate (FERRLECIT/NULECIT) IV  125 mg Intravenous Q T,Th,Sa-HD  . heparin  5,000 Units Subcutaneous 3 times per day  . HYDROmorphone      . isosorbide dinitrate  10 mg Oral TID  . lisinopril  2.5 mg Oral BID  . [START ON 02/23/2015] LORazepam  1 mg Oral Q T,Th,Sa-HD  . multivitamin  1 tablet Oral QHS  . sertraline  100 mg Oral Daily  . sodium chloride  3 mL Intravenous Q12H  . sodium chloride  3 mL Intravenous Q12H   Continuous Infusions:   Principal Problem:   Volume overload Active Problems:   ESRD (end stage renal disease)   Pleuritic chest pain   Systolic CHF, chronic   Hyperkalemia    Time spent: 35 minutes.     Hartley Barefoot A  [facility] Pager [phone]. If 7PM-7AM, please contact night-coverage at [email or web address], password [facility] 02/22/2015, 1:50 PM  LOS: 1 day

## 2015-02-22 NOTE — Procedures (Signed)
I have personally attended this patient's dialysis session.   3KG goal, 2K bath K 5.9 Left upper AVF 400 - increase to 450 = usual BFR Tolerating well right now  Camille Bal, MD [facility] [phone] Pager 02/22/2015, 9:01 AM

## 2015-02-22 NOTE — Progress Notes (Signed)
[facility] Rounding Note Subjective:  Seen on dialysis Main issue is SOB and abd distension which "makes it hard to get a deep breath" Very vague about why missing treatments (car trouble etc) Says has not been to her unit in [place] in a month ([facility] [place] -  [address] (She was dismissed from [facility] in 2014 because of similar non-adherence issues)  Objective Vital signs in last 24 hours: Filed Vitals:   02/21/15 2245 02/22/15 0517 02/22/15 0755 02/22/15 0800  BP:  149/110 134/96 138/77  Pulse:  96 94 92  Temp:  97.9 F (36.6 C) 97.1 F (36.2 C)   TempSrc:  Oral Oral   Resp: Height:      Weight:   64.229 kg (141 lb 9.6 oz)   SpO2:  100% 100%    Weight change:   Intake/Output Summary (Last 24 hours) at 02/22/15 0831 Last data filed at 02/22/15 0025  Gross per 24 hour  Intake    110 ml  Output    100 ml  Net     10 ml    Physical Exam:  BP 138/77 mmHg  Pulse 92  Temp(Src) 97.1 F (36.2 C) (Oral)  Resp 15  Ht  (1.6 m)  Wt 64.229 kg (141 lb 9.6 oz)  BMI 25.09 kg/m2  SpO2 100%  LMP 08/23/2014 (Approximate)  Puffy in appearance but NAD +JVD Lungs clear ICD in place Abd protuberant non-tender 1+ edema both LE's Left upper arm AVF currently cannulated  Labs: Basic Metabolic Panel:  Recent Labs Lab 02/21/15 1310 02/21/15 1926 02/22/15 0036  NA 136  --  136  K 5.5*  --  5.9*  CL 103  --  99*  CO2 17*  --  20*  GLUCOSE 95  --  115*  BUN 117*  --  115*  CREATININE 14.10* 14.56* 14.74*  CALCIUM 9.3  --  10.1  PHOS  --   --  9.9*   Liver Function Tests:  Recent Labs Lab 02/21/15 1310 02/22/15 0036  AST 16  --   ALT 12*  --   ALKPHOS 236*  --   BILITOT 0.5  --   PROT 7.7  --   ALBUMIN 3.5 3.5     Recent Labs Lab 02/21/15 1310 02/21/15 1926 02/22/15 0619  WBC 6.8 9.6 5.0  HGB 10.1* 10.9* 9.8*  HCT 30.5* 32.7* 30.2*  MCV 88.9 89.1 88.3  PLT PLATELETS APPEAR ADEQUATE  168 163    Recent Labs Lab 02/21/15 1310 02/21/15 1926 02/22/15 0036 02/22/15 0619  TROPONINI 0.30* 0.25* 0.28* 0.24*   CBG: No results for input(s): GLUCAP in the last 168 hours.  Iron Studies:  Recent Labs Lab 02/22/15 0619  IRON 37  TIBC 192*  FERRITIN 576*  Transferrin saturation 19%  Studies/Results: Ct Angio Chest Pe W/cm &/or Wo Cm  02/21/2015   CLINICAL DATA:  Chest pain abdominal pain for 2 days. Hemodialysis patient.  EXAM: CT ANGIOGRAPHY CHEST WITH CONTRAST  TECHNIQUE: Multidetector CT imaging of the chest was performed using the standard protocol during bolus administration of intravenous contrast. Multiplanar CT image reconstructions and MIPs were obtained to evaluate the vascular anatomy.  CONTRAST:  80mL OMNIPAQUE IOHEXOL 350 MG/ML SOLN  COMPARISON:  CT thorax 12/20/2014  FINDINGS: Mediastinum/Nodes: No filling defects within the pulmonary suggest acute pulmonary embolism. Acute findings aorta great vessels. The heart is enlarged. No pericardial fluid.  Lungs/Pleura: No evidence of pulmonary  edema. There is trace right pleural effusion. No infiltrate or pneumothorax.  Upper abdomen: There is small amount of intra-abdominal free fluid. Kidneys are atrophic with multiple mixed density lesions.  Musculoskeletal: No acute findings of skeleton. There is a pacemaker in the right chest wall.  Review of the MIP images confirms the above findings.  IMPRESSION: 1. No evidence of pulmonary embolism. 2. Cardiomegaly without evidence of pulmonary edema. 3. Small right effusion. 4. Free fluid in the abdomen may relate to heart failure or end-stage renal disease. 5. Nodular kidneys unchanged from prior.   Electronically Signed   By: Genevive Bi M.D.   On: 02/21/2015 20:35   Medications:   . aspirin EC  81 mg Oral Daily  . calcium acetate  1,334 mg Oral TID WC  . carvedilol  6.25 mg Oral BID  . darbepoetin (ARANESP) injection - DIALYSIS  60 mcg Intravenous Once  . famotidine  20  mg Oral Daily  . heparin  5,000 Units Subcutaneous 3 times per day  . isosorbide dinitrate  10 mg Oral TID  . lisinopril  2.5 mg Oral BID  . [START ON 02/23/2015] LORazepam  1 mg Oral Q T,Th,Sa-HD  . multivitamin  1 tablet Oral QHS  . sertraline  100 mg Oral Daily  . sodium chloride  3 mL Intravenous Q12H  . sodium chloride  3 mL Intravenous Q12H   Dialysis prescription [facility] unit [address] [phone]  EDW 54.5 kg.3 hours 45 minutes.Not there for entire month of April HD Bath 2K 2 Ca Polyflux 180 dialyzer. Heparin 2000 bolus. Access left upper arm AV fistula.  Assessment/Plan: 24 year old black female with long-standing end-stage renal disease presenting with essentially noncompliance with dialysis  1 Shortness of breath/pleuritic chest pain- workup per primary as she had had a PE in the past. 10 kg over her dry weight probably has something to do with that along with her cardiomyopathy. 2 ESRD: Her normal schedule is TTS via AV fistula in [place]. According to her unit no HD there for the entire month of April (they made her "inactive" but is not discharged from their unit) they state she was at [facility] there in [place] on the 27th of April..they report she is very poorly compliant. 3 Hypertension: She is hypertensive likely secondary to volume overload. She is on Coreg and lisinopril for her cardiomyopathy - meds will be continued 4. 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  Not significantly decreased. I do not know her ESA history. Iron studies show tsat of 19%. Add some IV FE with HD.  Gettng Aranesp here.  5. Metabolic Bone Disease: Is noted to be on PhosLo this will be continued. Phos of 9.9 speaks against outpt compliance 6. Chronic systolic heart failure with ICD in place   Camille Bal, MD [facility] [phone] Pager 02/22/2015, 8:42 AM

## 2015-02-22 NOTE — Progress Notes (Signed)
Hemodialysis- Tolerated well without issue, total UF 3L. Pt refused higher goal d/t "If they take more than 3L I cramp." No complaints. Report given to Zahara RN.

## 2015-02-23 DIAGNOSIS — I5022 Chronic systolic (congestive) heart failure: Secondary | ICD-10-CM

## 2015-02-23 LAB — RENAL FUNCTION PANEL
Albumin: 3.4 g/dL — ABNORMAL LOW (ref 3.5–5.0)
Anion gap: 17 — ABNORMAL HIGH (ref 5–15)
BUN: 55 mg/dL — AB (ref 6–20)
CALCIUM: 9.7 mg/dL (ref 8.9–10.3)
CHLORIDE: 98 mmol/L — AB (ref 101–111)
CO2: 25 mmol/L (ref 22–32)
CREATININE: 8.79 mg/dL — AB (ref 0.44–1.00)
GFR calc non Af Amer: 6 mL/min — ABNORMAL LOW (ref >60)
GFR, EST AFRICAN AMERICAN: 7 mL/min — AB (ref >60)
GLUCOSE: 90 mg/dL (ref 70–99)
Phosphorus: 7.8 mg/dL — ABNORMAL HIGH (ref 2.5–4.6)
Potassium: 4 mmol/L (ref 3.5–5.1)
Sodium: 140 mmol/L (ref 135–145)

## 2015-02-23 LAB — HCG, QUANTITATIVE, PREGNANCY: HCG, BETA CHAIN, QUANT, S: 27 m[IU]/mL — AB (ref <5)

## 2015-02-23 MED ORDER — HYDROMORPHONE HCL 1 MG/ML IJ SOLN
INTRAMUSCULAR | Status: AC
  Administered 2015-02-23: 1 mg
  Filled 2015-02-23: qty 1

## 2015-02-23 MED ORDER — PANTOPRAZOLE SODIUM 40 MG PO TBEC
40.0000 mg | DELAYED_RELEASE_TABLET | Freq: Every day | ORAL | Status: DC
  Administered 2015-02-23 – 2015-02-25 (×3): 40 mg via ORAL
  Filled 2015-02-23 (×2): qty 1

## 2015-02-23 NOTE — Procedures (Signed)
I have personally attended this patient's dialysis session.   Labs are all pending 2K bath pending labs Goal 4 liters 7 kg over EDW AVF 400   Camille Bal, MD [facility] [phone] Pager 02/23/2015, 8:13 AM

## 2015-02-23 NOTE — Progress Notes (Signed)
[facility] PROGRESS NOTE  [name] Small XHB:[id number] DOB: 03-21-1991 DOA: 02/21/2015 PCP: Audria Nine  Assessment/Plan: 1-Chest pain: in setting of cardiomyopathy. CT angio negative for PE.  Cardiac enzymes mildly elevated.  Chest pain persist, pleuritic. Breathing better.  Continue with aspirin. Coreg.  ECHO with improved EF, 45 %, diffuse wall motion abnormality. . EKG sinus rhythm.  Will add Protonix.   Acute on chronic systolic HF; S/P AICD.  EF per last echocardiogram (February 2016 in care everywhere) around 10%. Continue with lisinopril, isosorbide.   Abdominal distension, pain. Mild ascites: related to HF, RF.  Will order abdominal US, depending on results could consider paracentesis.  ALK phosphatase elevate. Will repeat labs in am.   Hypertension: continue with coreg.   ESRD: correction of electrolytes with dialysis.  Received dialysis 5-2., 5-03   Code Status: Full code.  Family Communication: care discussed with patient Disposition Plan: remain inpatient. She is homeless.    Consultants:  Cardiology  nephrology  Procedures:  ECHO; Ef 35 % , diffuse wall motion abnormalities.   Antibiotics:  none  HPI/Subjective: She is feeling better. Still with pleuritic chest pain.  Abdominal distension for  1 week. Had BM last night.    Objective: Filed Vitals:   02/23/15 1144  BP: 136/101  Pulse: 93  Temp: 98.2 F (36.8 C)  Resp: 16    Intake/Output Summary (Last 24 hours) at 02/23/15 1451 Last data filed at 02/23/15 1155  Gross per 24 hour  Intake   1090 ml  Output   3500 ml  Net  -2410 ml   Filed Weights   02/23/15 0449 02/23/15 0700 02/23/15 1102  Weight: 61.961 kg (136 lb 9.6 oz) 61.7 kg (136 lb 0.4 oz) 58 kg (127 lb 13.9 oz)    Exam:   General:  NAD  Cardiovascular: S 1, S 2 RRR  Respiratory: CTA  Abdomen: BS present, distended.   Musculoskeletal: no edema.   Data Reviewed: Basic Metabolic Panel:  Recent  Labs Lab 02/21/15 1310 02/21/15 1926 02/22/15 0036 02/23/15 0736  NA 136  --  136 140  K 5.5*  --  5.9* 4.0  CL 103  --  99* 98*  CO2 17*  --  20* 25  GLUCOSE 95  --  115* 90  BUN 117*  --  115* 55*  CREATININE 14.10* 14.56* 14.74* 8.79*  CALCIUM 9.3  --  10.1 9.7  MG 2.2  --   --   --   PHOS  --   --  9.9* 7.8*   Liver Function Tests:  Recent Labs Lab 02/21/15 1310 02/22/15 0036 02/23/15 0736  AST 16  --   --   ALT 12*  --   --   ALKPHOS 236*  --   --   BILITOT 0.5  --   --   PROT 7.7  --   --   ALBUMIN 3.5 3.5 3.4*   No results for input(s): LIPASE, AMYLASE in the last 168 hours. No results for input(s): AMMONIA in the last 168 hours. CBC:  Recent Labs Lab 02/21/15 1310 02/21/15 1926 02/22/15 0619  WBC 6.8 9.6 5.0  HGB 10.1* 10.9* 9.8*  HCT 30.5* 32.7* 30.2*  MCV 88.9 89.1 88.3  PLT PLATELETS APPEAR ADEQUATE 168 163   Cardiac Enzymes:  Recent Labs Lab 02/21/15 1310 02/21/15 1926 02/22/15 0036 02/22/15 0619  TROPONINI 0.30* 0.25* 0.28* 0.24*   BNP (last 3 results) No results for input(s): BNP in the last 8760 hours.  ProBNP (last 3 results) No results for input(s): PROBNP in the last 8760 hours.  CBG: No results for input(s): GLUCAP in the last 168 hours.  No results found for this or any previous visit (from the past 240 hour(s)).   Studies: Ct Angio Chest Pe W/cm &/or Wo Cm  02/21/2015   CLINICAL DATA:  Chest pain abdominal pain for 2 days. Hemodialysis patient.  EXAM: CT ANGIOGRAPHY CHEST WITH CONTRAST  TECHNIQUE: Multidetector CT imaging of the chest was performed using the standard protocol during bolus administration of intravenous contrast. Multiplanar CT image reconstructions and MIPs were obtained to evaluate the vascular anatomy.  CONTRAST:  68m OMNIPAQUE IOHEXOL 350 MG/ML SOLN  COMPARISON:  CT thorax 12/20/2014  FINDINGS: Mediastinum/Nodes: No filling defects within the pulmonary suggest acute pulmonary embolism. Acute findings aorta  great vessels. The heart is enlarged. No pericardial fluid.  Lungs/Pleura: No evidence of pulmonary edema. There is trace right pleural effusion. No infiltrate or pneumothorax.  Upper abdomen: There is small amount of intra-abdominal free fluid. Kidneys are atrophic with multiple mixed density lesions.  Musculoskeletal: No acute findings of skeleton. There is a pacemaker in the right chest wall.  Review of the MIP images confirms the above findings.  IMPRESSION: 1. No evidence of pulmonary embolism. 2. Cardiomegaly without evidence of pulmonary edema. 3. Small right effusion. 4. Free fluid in the abdomen may relate to heart failure or end-stage renal disease. 5. Nodular kidneys unchanged from prior.   Electronically Signed   By: SSuzy BouchardM.D.   On: 02/21/2015 20:35    Scheduled Meds: . aspirin EC  81 mg Oral Daily  . calcium acetate  1,334 mg Oral TID WC  . carvedilol  6.25 mg Oral BID  . famotidine  20 mg Oral Daily  . ferric gluconate (FERRLECIT/NULECIT) IV  125 mg Intravenous Q T,Th,Sa-HD  . heparin  5,000 Units Subcutaneous 3 times per day  . isosorbide dinitrate  10 mg Oral TID  . lisinopril  2.5 mg Oral BID  . LORazepam  1 mg Oral Q T,Th,Sa-HD  . multivitamin  1 tablet Oral QHS  . sertraline  100 mg Oral Daily  . sodium chloride  3 mL Intravenous Q12H  . sodium chloride  3 mL Intravenous Q12H   Continuous Infusions:   Principal Problem:   Volume overload Active Problems:   ESRD (end stage renal disease)   Pleuritic chest pain   Systolic CHF, chronic   Hyperkalemia    Time spent: 35 minutes.     RNiel HummerA  [facility] Pager [phone] If 7PM-7AM, please contact night-coverage at [email or web address], password [facility]5/12/2014, 2:51 PM  LOS: 2 days

## 2015-02-23 NOTE — Progress Notes (Signed)
Utilization review complete. Harol Shabazz RN CCM Case Mgmt phone [phone] 

## 2015-02-23 NOTE — Progress Notes (Addendum)
[facility] Rounding Note Subjective:  Seen on dialysis - 2nd day in a row Still c/o abd distension "are you going to tap this off"? Remains very vague about why missing treatments (car trouble etc) "It's just hard for me" Says has not been to her unit in [place] in a month ([facility] -  [address]) (She was dismissed from [facility] in 2014 because of similar non-adherence issues)  Objective Vital signs in last 24 hours: Filed Vitals:   02/23/15 0700 02/23/15 0711 02/23/15 0730 02/23/15 0800  BP: 140/106 140/103 139/100 130/103  Pulse: 96 97 96 100  Temp: 97 F (36.1 C)     TempSrc: Oral     Resp: 13     Height:      Weight: 61.7 kg (136 lb 0.4 oz)     SpO2: 99%      Weight change: 0.726 kg (1 lb 9.6 oz)  Intake/Output Summary (Last 24 hours) at 02/23/15 0806 Last data filed at 02/23/15 0200  Gross per 24 hour  Intake    990 ml  Output   3000 ml  Net  -2010 ml    Physical Exam:  BP 130/103 mmHg  Pulse 100  Temp(Src) 97 F (36.1 C) (Oral)  Resp 13  Ht 5\' 3"  (1.6 m)  Wt 61.7 kg (136 lb 0.4 oz)  BMI 24.10 kg/m2  SpO2 99%  LMP 08/23/2014 (Approximate)  Puffy in appearance but NAD Pre HD weight 61.7 kg with EDW of 54.5 +JVD Lungs clear ICD in place Abd protuberant non-tender 1+ edema both LE's Left upper arm AVF currently cannulated for dialysis  Labs: Basic Metabolic Panel:  No labs yet this AM - draw in HD  Recent Labs Lab 02/21/15 1310 02/21/15 1926 02/22/15 0036  NA 136  --  136  K 5.5*  --  5.9*  CL 103  --  99*  CO2 17*  --  20*  GLUCOSE 95  --  115*  BUN 117*  --  115*  CREATININE 14.10* 14.56* 14.74*  CALCIUM 9.3  --  10.1  PHOS  --   --  9.9*   Liver Function Tests:  Recent Labs Lab 02/21/15 1310 02/22/15 0036  AST 16  --   ALT 12*  --   ALKPHOS 236*  --   BILITOT 0.5  --   PROT 7.7  --   ALBUMIN 3.5 3.5     Recent Labs Lab 02/21/15 1310 02/21/15 1926 02/22/15 0619  WBC  6.8 9.6 5.0  HGB 10.1* 10.9* 9.8*  HCT 30.5* 32.7* 30.2*  MCV 88.9 89.1 88.3  PLT PLATELETS APPEAR ADEQUATE 168 163    Recent Labs Lab 02/21/15 1310 02/21/15 1926 02/22/15 0036 02/22/15 0619  TROPONINI 0.30* 0.25* 0.28* 0.24*   CBG: No results for input(s): GLUCAP in the last 168 hours.  Iron Studies:   Recent Labs Lab 02/22/15 0619  IRON 37  TIBC 192*  FERRITIN 576*  Transferrin saturation 19%  Studies/Results: Ct Angio Chest Pe W/cm &/or Wo Cm  02/21/2015   CLINICAL DATA:  Chest pain abdominal pain for 2 days. Hemodialysis patient.  EXAM: CT ANGIOGRAPHY CHEST WITH CONTRAST  TECHNIQUE: Multidetector CT imaging of the chest was performed using the standard protocol during bolus administration of intravenous contrast. Multiplanar CT image reconstructions and MIPs were obtained to evaluate the vascular anatomy.  CONTRAST:  61mL OMNIPAQUE IOHEXOL 350 MG/ML SOLN  COMPARISON:  CT thorax 12/20/2014  FINDINGS: Mediastinum/Nodes: No  filling defects within the pulmonary suggest acute pulmonary embolism. Acute findings aorta great vessels. The heart is enlarged. No pericardial fluid.  Lungs/Pleura: No evidence of pulmonary edema. There is trace right pleural effusion. No infiltrate or pneumothorax.  Upper abdomen: There is small amount of intra-abdominal free fluid. Kidneys are atrophic with multiple mixed density lesions.  Musculoskeletal: No acute findings of skeleton. There is a pacemaker in the right chest wall.  Review of the MIP images confirms the above findings.  IMPRESSION: 1. No evidence of pulmonary embolism. 2. Cardiomegaly without evidence of pulmonary edema. 3. Small right effusion. 4. Free fluid in the abdomen may relate to heart failure or end-stage renal disease. 5. Nodular kidneys unchanged from prior.   Electronically Signed   By: Genevive Bi M.D.   On: 02/21/2015 20:35   Medications:   . aspirin EC  81 mg Oral Daily  . calcium acetate  1,334 mg Oral TID WC  .  carvedilol  6.25 mg Oral BID  . famotidine  20 mg Oral Daily  . ferric gluconate (FERRLECIT/NULECIT) IV  125 mg Intravenous Q T,Th,Sa-HD  . heparin  5,000 Units Subcutaneous 3 times per day  . isosorbide dinitrate  10 mg Oral TID  . lisinopril  2.5 mg Oral BID  . LORazepam  1 mg Oral Q T,Th,Sa-HD  . multivitamin  1 tablet Oral QHS  . sertraline  100 mg Oral Daily  . sodium chloride  3 mL Intravenous Q12H  . sodium chloride  3 mL Intravenous Q12H   Dialysis prescription [facility] unit [address] [phone]  EDW 54.5 kg.3 hours 45 minutes.Not there for entire month of April HD Bath 2K 2 Ca Polyflux 180 dialyzer. Heparin 2000 bolus. Access left upper arm AV fistula.  Assessment/Plan: 24 year old black female with long-standing end-stage renal disease presenting with essentially noncompliance with dialysis  1 Shortness of breath/pleuritic chest pain- CT angio negative. Further workup per primary service. Suspect more volume overload issue than anything . 2 ESRD: Her normal schedule is TTS via AV fistula in [place]. According to her unit no HD there for the entire month of April (they made her "inactive" but is not discharged from their unit) they state she was at [facility] there in [place] on the 27th of April..they report she is very poorly compliant. She is getting second serial HD today. Remains 7 kg above her EDW (10 kg up on admission). Talked to her frankly about her adherance issues. I don't get the sense that she plans to change anything...  3 Hypertension: She is hypertensive likely secondary to volume overload. She is on Coreg and lisinopril for her cardiomyopathy - meds will be continued  4. 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  Not significantly decreased. I do not know her ESA history since most of her HD has been in hospitals for the last month. Iron studies show tsat of 19%. Add some IV FE with HD.  Gettng Aranesp here.   5. Metabolic Bone  Disease: Is noted to be on PhosLo which has been continued. Phos of 9.9 speaks against outpt compliance  6. Chronic systolic heart failure with ICD in place. Last documented EF about 10% - repeat ECHO this admission improved EF to 40-45%  7. Ascites - HD will be "slow" way to remove this.   Camille Bal, MD [facility] [phone] Pager 02/23/2015, 8:06 AM

## 2015-02-23 NOTE — Plan of Care (Signed)
Problem: Phase II Progression Outcomes Goal: Dialysis initiated Outcome: Completed/Met Date Met:  02/23/15 Patient was on HD prior to admission.

## 2015-02-24 ENCOUNTER — Inpatient Hospital Stay (HOSPITAL_COMMUNITY): Payer: Medicare Other

## 2015-02-24 LAB — RENAL FUNCTION PANEL
ALBUMIN: 3.4 g/dL — AB (ref 3.5–5.0)
ANION GAP: 14 (ref 5–15)
BUN: 30 mg/dL — ABNORMAL HIGH (ref 6–20)
CHLORIDE: 95 mmol/L — AB (ref 101–111)
CO2: 26 mmol/L (ref 22–32)
Calcium: 10.4 mg/dL — ABNORMAL HIGH (ref 8.9–10.3)
Creatinine, Ser: 5.99 mg/dL — ABNORMAL HIGH (ref 0.44–1.00)
GFR calc non Af Amer: 9 mL/min — ABNORMAL LOW (ref >60)
GFR, EST AFRICAN AMERICAN: 10 mL/min — AB (ref >60)
Glucose, Bld: 83 mg/dL (ref 70–99)
POTASSIUM: 4 mmol/L (ref 3.5–5.1)
Phosphorus: 7.5 mg/dL — ABNORMAL HIGH (ref 2.5–4.6)
SODIUM: 135 mmol/L (ref 135–145)

## 2015-02-24 LAB — HEPATIC FUNCTION PANEL
ALBUMIN: 3.4 g/dL — AB (ref 3.5–5.0)
ALT: 13 U/L — AB (ref 14–54)
AST: 21 U/L (ref 15–41)
Alkaline Phosphatase: 238 U/L — ABNORMAL HIGH (ref 38–126)
Bilirubin, Direct: 0.2 mg/dL (ref 0.1–0.5)
Indirect Bilirubin: 0.6 mg/dL (ref 0.3–0.9)
TOTAL PROTEIN: 7.7 g/dL (ref 6.5–8.1)
Total Bilirubin: 0.8 mg/dL (ref 0.3–1.2)

## 2015-02-24 LAB — CBC
HCT: 33.6 % — ABNORMAL LOW (ref 36.0–46.0)
Hemoglobin: 10.5 g/dL — ABNORMAL LOW (ref 12.0–15.0)
MCH: 28.8 pg (ref 26.0–34.0)
MCHC: 31.3 g/dL (ref 30.0–36.0)
MCV: 92.3 fL (ref 78.0–100.0)
PLATELETS: 142 10*3/uL — AB (ref 150–400)
RBC: 3.64 MIL/uL — ABNORMAL LOW (ref 3.87–5.11)
RDW: 17.1 % — AB (ref 11.5–15.5)
WBC: 7.6 10*3/uL (ref 4.0–10.5)

## 2015-02-24 NOTE — Progress Notes (Signed)
Patient refusing heparin sq injections. Educated pt. Verbalizes understanding. Continues to refuse.

## 2015-02-24 NOTE — Progress Notes (Addendum)
[facility] PROGRESS NOTE  [name] Small OBS:[id number] DOB: [birth year] DOA: 02/21/2015 PCP: Audria Nine  Assessment/Plan: 1-Chest pain: in setting of NICM/VOlume overload. CT angio negative for PE.  -resolved -Cardiac enzymes mildly elevated, this is chronic, 0.24-0.3: non ACSs pattern -Chart reviewed from [facility]: .  [place] 10/15: Severely reduced LVEF ~ 20 with mildly dilated LV and global severe hypokinesis, Widely patent coronaries without significant obstruction. ECHO 4/16 with improved EF of 35-40% -Continue with aspirin. Coreg.  -EKG without acute changes  Acute on chronic systolic HF; S/P AICD.  EF per last echocardiogram (February 2016 in care everywhere) around 10%, now EF of 35% Continue with lisinopril, isosorbide Volume managed with HD   Abdominal distension, pain. Mild ascites: related to CHF, ESRD  Abdominal US, with onky small amount of ascites  ALK phosphatase mildly elevated and stable  Hypertension: continue with coreg.   ESRD/Volume overload Per Renal Received dialysis 5-2., 5-03  Code Status: Full code.  Family Communication: care discussed with patient Disposition Plan: Home tomorrow after HD   Consultants:  Cardiology  nephrology  Procedures:  ECHO; Ef 35 % , diffuse wall motion abnormalities.   Antibiotics:  none  HPI/Subjective: Breathing better, some diarrhea but improved    Objective: Filed Vitals:   02/24/15 0955  BP: 146/97  Pulse: 91  Temp: 98.1 F (36.7 C)  Resp: 16    Intake/Output Summary (Last 24 hours) at 02/24/15 1338 Last data filed at 02/23/15 1852  Gross per 24 hour  Intake    300 ml  Output      0 ml  Net    300 ml   Filed Weights   02/23/15 0700 02/23/15 1102 02/23/15 2001  Weight: 61.7 kg (136 lb 0.4 oz) 58 kg (127 lb 13.9 oz) 58.514 kg (129 lb)    Exam:   General:  AAOx3  HEENT; face appears mildy puffy  Cardiovascular: S 1, S 2 RRR  Respiratory: CTAB  Abdomen: soft, mildly  distended, BS present   Musculoskeletal: no edema.   Data Reviewed: Basic Metabolic Panel:  Recent Labs Lab 02/21/15 1310 02/21/15 1926 02/22/15 0036 02/23/15 0736 02/24/15 0432  NA 136  --  136 140 135  K 5.5*  --  5.9* 4.0 4.0  CL 103  --  99* 98* 95*  CO2 17*  --  20* 25 26  GLUCOSE 95  --  115* 90 83  BUN 117*  --  115* 55* 30*  CREATININE 14.10* 14.56* 14.74* 8.79* 5.99*  CALCIUM 9.3  --  10.1 9.7 10.4*  MG 2.2  --   --   --   --   PHOS  --   --  9.9* 7.8* 7.5*   Liver Function Tests:  Recent Labs Lab 02/21/15 1310 02/22/15 0036 02/23/15 0736 02/24/15 0432  AST 16  --   --  21  ALT 12*  --   --  13*  ALKPHOS 236*  --   --  238*  BILITOT 0.5  --   --  0.8  PROT 7.7  --   --  7.7  ALBUMIN 3.5 3.5 3.4* 3.4*  3.4*   No results for input(s): LIPASE, AMYLASE in the last 168 hours. No results for input(s): AMMONIA in the last 168 hours. CBC:  Recent Labs Lab 02/21/15 1310 02/21/15 1926 02/22/15 0619 02/24/15 0432  WBC 6.8 9.6 5.0 7.6  HGB 10.1* 10.9* 9.8* 10.5*  HCT 30.5* 32.7* 30.2* 33.6*  MCV 88.9 89.1 88.3 92.3  PLT PLATELETS APPEAR ADEQUATE 168 163 142*   Cardiac Enzymes:  Recent Labs Lab 02/21/15 1310 02/21/15 1926 02/22/15 0036 02/22/15 0619  TROPONINI 0.30* 0.25* 0.28* 0.24*   BNP (last 3 results) No results for input(s): BNP in the last 8760 hours.  ProBNP (last 3 results) No results for input(s): PROBNP in the last 8760 hours.  CBG: No results for input(s): GLUCAP in the last 168 hours.  No results found for this or any previous visit (from the past 240 hour(s)).   Studies: US Abdomen Complete  02/24/2015   CLINICAL DATA:  Abdominal distention  EXAM: ULTRASOUND ABDOMEN COMPLETE  COMPARISON:  01/14/2013  FINDINGS: Gallbladder: No gallstones or wall thickening visualized. No sonographic Murphy sign noted.  Common bile duct: Diameter: 3 mm  Liver: No focal lesion identified. Within normal limits in parenchymal echogenicity.  IVC: No  abnormality visualized.  Pancreas: Visualized portion unremarkable.  Spleen: Size and appearance within normal limits.  Right Kidney: Length: 8.5 cm in length. There is increased echogenicity throughout the renal cortex. Multiple cysts are again noted.  Left Kidney: Length: 9.1 cm in length. Increased cortical echogenicity. Multiple cysts are present throughout the left kidney.  Abdominal aorta: No aneurysm visualized.  Other findings: Small amount of ascites is present.  IMPRESSION: Small amount of ascites.  Chronic changes of the kidneys are not significantly changed.   Electronically Signed   By: Marybelle Killings M.D.   On: 02/24/2015 08:39    Scheduled Meds: . aspirin EC  81 mg Oral Daily  . calcium acetate  1,334 mg Oral TID WC  . carvedilol  6.25 mg Oral BID  . famotidine  20 mg Oral Daily  . ferric gluconate (FERRLECIT/NULECIT) IV  125 mg Intravenous Q T,Th,Sa-HD  . heparin  5,000 Units Subcutaneous 3 times per day  . isosorbide dinitrate  10 mg Oral TID  . lisinopril  2.5 mg Oral BID  . LORazepam  1 mg Oral Q T,Th,Sa-HD  . multivitamin  1 tablet Oral QHS  . pantoprazole  40 mg Oral Daily  . sertraline  100 mg Oral Daily  . sodium chloride  3 mL Intravenous Q12H  . sodium chloride  3 mL Intravenous Q12H   Continuous Infusions:   Principal Problem:   Volume overload Active Problems:   ESRD (end stage renal disease)   Pleuritic chest pain   Systolic CHF, chronic   Hyperkalemia    Time spent:25 minutes.     [facility]  [facility] Pager [phone]. If 7PM-7AM, please contact night-coverage at [email or web address], password [email or web address] 02/24/2015, 1:38 PM  LOS: 3 days

## 2015-02-24 NOTE — Progress Notes (Signed)
[facility] Rounding Note Subjective:  S/p HD X 2 Weight down 67 kg -->58 kg (EDW 54.5) She is still worried about her ascites Told her HD not a very effective way to remove this Peripheral edema much better  Objective Vital signs in last 24 hours: Filed Vitals:   02/23/15 1144 02/23/15 1604 02/23/15 2001 02/24/15 0426  BP: 136/101 119/87 134/96 134/92  Pulse: 93 99 99 96  Temp: 98.2 F (36.8 C) 98.2 F (36.8 C) 98.6 F (37 C) 98.2 F (36.8 C)  TempSrc: Oral Oral Oral Oral  Resp: 16 17 18 16   Height:      Weight:   58.514 kg (129 lb)   SpO2: 98% 98% 99% 100%   Weight change: -6.229 kg (-13 lb 11.7 oz)  Intake/Output Summary (Last 24 hours) at 02/24/15 0744 Last data filed at 02/23/15 1852  Gross per 24 hour  Intake    520 ml  Output   3500 ml  Net  -2980 ml    Physical Exam:  BP 134/92 mmHg  Pulse 96  Temp(Src) 98.2 F (36.8 C) (Oral)  Resp 16  Ht 5\' 3"  (1.6 m)  Wt 58.514 kg (129 lb)  BMI 22.86 kg/m2  SpO2 100%  LMP 08/23/2014 (Approximate)  Puffy in appearance but NAD Post HD weight 5/3 was 58 kg (EDW 54.5) No JVD Lungs clear ICD in place Abd protuberant non-tender, soft Trace LE edema Left upper arm AVF +bruit  Labs: Basic Metabolic Panel:    Recent Labs Lab 02/21/15 1310 02/21/15 1926 02/22/15 0036 02/23/15 0736 02/24/15 0432  NA 136  --  136 140 135  K 5.5*  --  5.9* 4.0 4.0  CL 103  --  99* 98* 95*  CO2 17*  --  20* 25 26  GLUCOSE 95  --  115* 90 83  BUN 117*  --  115* 55* 30*  CREATININE 14.10* 14.56* 14.74* 8.79* 5.99*  CALCIUM 9.3  --  10.1 9.7 10.4*  PHOS  --   --  9.9* 7.8* 7.5*   Liver Function Tests:  Recent Labs Lab 02/21/15 1310 02/22/15 0036 02/23/15 0736 02/24/15 0432  AST 16  --   --  21  ALT 12*  --   --  13*  ALKPHOS 236*  --   --  238*  BILITOT 0.5  --   --  0.8  PROT 7.7  --   --  7.7  ALBUMIN 3.5 3.5 3.4* 3.4*  3.4*     Recent Labs Lab 02/21/15 1310 02/21/15 1926 02/22/15 0619  02/24/15 0432  WBC 6.8 9.6 5.0 7.6  HGB 10.1* 10.9* 9.8* 10.5*  HCT 30.5* 32.7* 30.2* 33.6*  MCV 88.9 89.1 88.3 92.3  PLT PLATELETS APPEAR ADEQUATE 168 163 142*    Recent Labs Lab 02/21/15 1310 02/21/15 1926 02/22/15 0036 02/22/15 0619  TROPONINI 0.30* 0.25* 0.28* 0.24*   Iron Studies:   Recent Labs Lab 02/22/15 0619  IRON 37  TIBC 192*  FERRITIN 576*  Transferrin saturation 19%  Studies/Results: No results found. Medications:   . aspirin EC  81 mg Oral Daily  . calcium acetate  1,334 mg Oral TID WC  . carvedilol  6.25 mg Oral BID  . famotidine  20 mg Oral Daily  . ferric gluconate (FERRLECIT/NULECIT) IV  125 mg Intravenous Q T,Th,Sa-HD  . heparin  5,000 Units Subcutaneous 3 times per day  . isosorbide dinitrate  10 mg Oral TID  . lisinopril  2.5 mg  Oral BID  . LORazepam  1 mg Oral Q T,Th,Sa-HD  . multivitamin  1 tablet Oral QHS  . pantoprazole  40 mg Oral Daily  . sertraline  100 mg Oral Daily  . sodium chloride  3 mL Intravenous Q12H  . sodium chloride  3 mL Intravenous Q12H   Dialysis prescription [facility] Unit [address] [phone] EDW 54.5 kg.3 hours 45 minutes.Not there for entire month of April HD Bath 2K 2 Ca Polyflux 180 dialyzer. Heparin 2000 bolus. Access left upper arm AV fistula.  Assessment/Plan: 24 year old black female with long-standing end-stage renal disease presenting with essentially noncompliance with dialysis  1 Shortness of breath/pleuritic chest pain- CT angio negative.No chest pain this AM. Suspect driven more by volume than anything else                 .2 ESRD: Her normal schedule is TTS via AV fistula in [place]. According to her unit no HD there for the entire month of April (they made her "inactive" but is not discharged from their unit) they state she was at [facility] there in [place] on the 27th of April..they report she is very poorly compliant. Weight down to within 4 kg of EDW. Next HD  tomorrow will have her back on her outpt schedule (should she choose to do her outpt HD)_  3 Hypertension: Improved with volume reduction and remains on carvedilol and lisinopril  4. 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  Not significantly decreased. I do not know her ESA history since most of her HD has been in hospitals for the last month. Iron studies show tsat of 19%. Added some IV FE with HD.  Gettng Aranesp here (60 mcg on 5/2)  5. Metabolic Bone Disease: Is noted to be on PhosLo which has been continued. Initial phos of 9.9 speaks against outpt compliance and phos is slowly getting better in the hospital  6. Chronic systolic heart failure with ICD in place. Last documented EF PTA was about 10% - repeat ECHO this admission improved EF to 40-45%  7. Ascites - HD will be "slow" way to remove this. ? If enough for paracentesis  Plan HD tomorrow, first round. She could probably be discharged after from a renal perspective...   Camille Bal, MD [facility] [phone] Pager 02/24/2015, 7:44 AM

## 2015-02-25 DIAGNOSIS — R140 Abdominal distension (gaseous): Secondary | ICD-10-CM | POA: Insufficient documentation

## 2015-02-25 LAB — BASIC METABOLIC PANEL
Anion gap: 13 (ref 5–15)
BUN: 51 mg/dL — AB (ref 6–20)
CO2: 25 mmol/L (ref 22–32)
CREATININE: 7.8 mg/dL — AB (ref 0.44–1.00)
Calcium: 9.7 mg/dL (ref 8.9–10.3)
Chloride: 97 mmol/L — ABNORMAL LOW (ref 101–111)
GFR calc Af Amer: 8 mL/min — ABNORMAL LOW (ref >60)
GFR calc non Af Amer: 7 mL/min — ABNORMAL LOW (ref >60)
Glucose, Bld: 87 mg/dL (ref 70–99)
Potassium: 4.2 mmol/L (ref 3.5–5.1)
Sodium: 135 mmol/L (ref 135–145)

## 2015-02-25 LAB — CBC
HEMATOCRIT: 31.7 % — AB (ref 36.0–46.0)
Hemoglobin: 9.9 g/dL — ABNORMAL LOW (ref 12.0–15.0)
MCH: 28.8 pg (ref 26.0–34.0)
MCHC: 31.2 g/dL (ref 30.0–36.0)
MCV: 92.2 fL (ref 78.0–100.0)
PLATELETS: 149 10*3/uL — AB (ref 150–400)
RBC: 3.44 MIL/uL — ABNORMAL LOW (ref 3.87–5.11)
RDW: 16.8 % — ABNORMAL HIGH (ref 11.5–15.5)
WBC: 7.7 10*3/uL (ref 4.0–10.5)

## 2015-02-25 MED ORDER — OXYCODONE-ACETAMINOPHEN 5-325 MG PO TABS: 1.0000 | ORAL_TABLET | Freq: Three times a day (TID) | ORAL | Status: DC | PRN

## 2015-02-25 MED ORDER — ISOSORBIDE DINITRATE 10 MG PO TABS: 10.0000 mg | ORAL_TABLET | Freq: Three times a day (TID) | ORAL | Status: DC

## 2015-02-25 MED ORDER — DIPHENHYDRAMINE HCL 25 MG PO CAPS
ORAL_CAPSULE | ORAL | Status: AC
  Filled 2015-02-25: qty 1

## 2015-02-25 NOTE — Discharge Summary (Signed)
Physician Discharge Summary  [name] Small QQV:[id number] DOB: Jun 30, 1991 DOA: 02/21/2015  PCP: Audria Nine  Admit date: 02/21/2015 Discharge date: 02/25/2015  Time spent:45 minutes  Recommendations for Outpatient Follow-up:  1. PCP in 1 week 2. Please FU ALP in 1 weeks  Discharge Diagnoses:  Principal Problem:   Volume overload Active Problems:   ESRD (end stage renal disease)   Pleuritic chest pain   Systolic CHF, chronic   Hyperkalemia   Abdominal distension   NICM   Noncompliance  Discharge Condition: stable  Diet recommendation: renal  Filed Weights   02/24/15 2029 02/25/15 0704 02/25/15 1101  Weight: 59.104 kg (130 lb 4.8 oz) 58.5 kg (128 lb 15.5 oz) 55 kg (121 lb 4.1 oz)    History of present illness:  CHIEF COMPLAINT:  Right-sided chest pain and Abdominal distention with leg edema for past one week 23/F with NICM, EF of 10%, ESRD on HD, missed 1 week of HD and was admitted  Hospital Course:  1-Chest pain: in setting of NICM/VOlume overload. -resolved - CT angio negative for PE.  -Cardiac enzymes were mildly elevated, but this is chronic, Troponin 0.24-0.3: non ACS pattern -Chart reviewed from [facility]: . [place] 10/15: Severely reduced LVEF ~ 20 with mildly dilated LV and global severe hypokinesis, Widely patent coronaries without significant obstruction. -ECHO 4/16 with improved EF of 35-40% -Continue with aspirin. Coreg.  -EKG without acute changes  Acute on chronic systolic HF; S/P AICD.  -has NICM -Recent EF on ECHO 2/16 was around 10%, now EF of 35% -Continue with lisinopril, isosorbide -Volume managed with HD   Abdominal distension, pain. Mild ascites: related to CHF, ESRD  -Abdominal US, with only small amount of ascites  -ALK phosphatase mildly elevated and stable, will need workup if this persists  Hypertension:  -stable, continue coreg.   ESRD/Volume overload -Followed by Renal -was dialysis 5-2., 5-03 and  5/5  Consultations:  Renal  Discharge Exam: Filed Vitals:   02/25/15 1101  BP: 113/75  Pulse: 92  Temp: 98.8 F (37.1 C)  Resp: 20    General: AAOx3 Cardiovascular: S1S2/RRR Respiratory: CTAB  Discharge Instructions   Discharge Instructions    Discharge instructions    Complete by:  As directed   Renal Diet     Increase activity slowly    Complete by:  As directed           Current Discharge Medication List    CONTINUE these medications which have CHANGED   Details  isosorbide dinitrate (ISORDIL) 10 MG tablet Take 1 tablet (10 mg total) by mouth 3 (three) times daily. Qty: 60 tablet, Refills: 0      CONTINUE these medications which have NOT CHANGED   Details  B Complex-C-Folic Acid (DIALYVITE PO) Take 1 tablet by mouth.    calcium acetate (PHOSLO) 667 MG capsule Take 2 capsules (1,334 mg total) by mouth 3 (three) times daily with meals. Take 65m with snacks Qty: 120 capsule, Refills: 2    famotidine (PEPCID) 20 MG tablet Take 20 mg by mouth daily as needed for heartburn.     lisinopril (PRINIVIL,ZESTRIL) 2.5 MG tablet Take 1 tablet (2.5 mg total) by mouth 2 (two) times daily. Qty: 60 tablet, Refills: 0    LORazepam (ATIVAN) 1 MG tablet Take 1 tablet (1 mg total) by mouth See admin instructions. On dialysis days, before dialysis Qty: 12 tablet, Refills: 0    oxyCODONE-acetaminophen (PERCOCET/ROXICET) 5-325 MG per tablet Take 1 tablet by mouth every 8 (eight)  hours as needed for severe pain. Qty: 10 tablet, Refills: 0    sertraline (ZOLOFT) 100 MG tablet Take 100 mg by mouth daily.    carvedilol (COREG) 6.25 MG tablet Take 1 tablet (6.25 mg total) by mouth 2 (two) times daily. Qty: 60 tablet, Refills: 0      STOP taking these medications     amLODipine (NORVASC) 5 MG tablet      ramipril (ALTACE) 2.5 MG capsule      multivitamin (RENA-VIT) TABS tablet        Allergies  Allergen Reactions  . Tramadol Anaphylaxis  . Iohexol Itching  .  Morphine And Related Itching    Ok with oxycodone  . Vicodin [Hydrocodone-Acetaminophen] Hives      The results of significant diagnostics from this hospitalization (including imaging, microbiology, ancillary and laboratory) are listed below for reference.    Significant Diagnostic Studies: Dg Chest 2 View  02/12/2015   CLINICAL DATA:  Two day history of right-sided chest pain at the site of her pacemaker generator.  EXAM: CHEST  2 VIEW  COMPARISON:  02/03/2017 and earlier.  FINDINGS: Cardiac silhouette markedly enlarged but stable. Hilar and mediastinal contours otherwise unremarkable. Right subclavian single lead transvenous pacemaker unchanged and appears intact. No soft tissue swelling over the generator pack on the lateral image. Linear scarring in the left lower lobe, unchanged dating back to the November, 2015. Lungs otherwise clear. Bronchovascular markings normal. Pulmonary vascularity normal. No visible pleural effusions. No pneumothorax. Visualized bony thorax intact. IVC filter noted.  IMPRESSION: Stable marked cardiomegaly. Stable linear scarring in the left lower lobe. No acute cardiopulmonary disease.   Electronically Signed   By: Evangeline Dakin M.D.   On: 02/12/2015 21:43   Dg Chest 2 View  02/04/2015   CLINICAL DATA:  Left chest pain with shortness of breath for 1 day. History of dialysis and pacemaker placement 1 month ago. Former smoker with history of pulmonary embolism. Initial encounter.  EXAM: CHEST  2 VIEW  COMPARISON:  Radiographs 01/25/2015.  CT 12/12/2014.  FINDINGS: Right subclavian pacemaker lead appears unchanged at the right ventricular apex. There is stable moderate cardiomegaly and vascular congestion. There is no edema, confluent airspace opacity or significant pleural effusion. The bones appear unchanged with bilateral distal clavicular osteolysis.  IMPRESSION: Stable chest with cardiomegaly and chronic vascular congestion. No acute findings.   Electronically Signed    By: Richardean Sale M.D.   On: 02/04/2015 17:42   Ct Angio Chest Pe W/cm &/or Wo Cm  02/21/2015   CLINICAL DATA:  Chest pain abdominal pain for 2 days. Hemodialysis patient.  EXAM: CT ANGIOGRAPHY CHEST WITH CONTRAST  TECHNIQUE: Multidetector CT imaging of the chest was performed using the standard protocol during bolus administration of intravenous contrast. Multiplanar CT image reconstructions and MIPs were obtained to evaluate the vascular anatomy.  CONTRAST:  100m OMNIPAQUE IOHEXOL 350 MG/ML SOLN  COMPARISON:  CT thorax 12/20/2014  FINDINGS: Mediastinum/Nodes: No filling defects within the pulmonary suggest acute pulmonary embolism. Acute findings aorta great vessels. The heart is enlarged. No pericardial fluid.  Lungs/Pleura: No evidence of pulmonary edema. There is trace right pleural effusion. No infiltrate or pneumothorax.  Upper abdomen: There is small amount of intra-abdominal free fluid. Kidneys are atrophic with multiple mixed density lesions.  Musculoskeletal: No acute findings of skeleton. There is a pacemaker in the right chest wall.  Review of the MIP images confirms the above findings.  IMPRESSION: 1. No evidence of pulmonary embolism. 2. Cardiomegaly  without evidence of pulmonary edema. 3. Small right effusion. 4. Free fluid in the abdomen may relate to heart failure or end-stage renal disease. 5. Nodular kidneys unchanged from prior.   Electronically Signed   By: Suzy Bouchard M.D.   On: 02/21/2015 20:35   US Abdomen Complete  02/24/2015   CLINICAL DATA:  Abdominal distention  EXAM: ULTRASOUND ABDOMEN COMPLETE  COMPARISON:  01/14/2013  FINDINGS: Gallbladder: No gallstones or wall thickening visualized. No sonographic Murphy sign noted.  Common bile duct: Diameter: 3 mm  Liver: No focal lesion identified. Within normal limits in parenchymal echogenicity.  IVC: No abnormality visualized.  Pancreas: Visualized portion unremarkable.  Spleen: Size and appearance within normal limits.  Right  Kidney: Length: 8.5 cm in length. There is increased echogenicity throughout the renal cortex. Multiple cysts are again noted.  Left Kidney: Length: 9.1 cm in length. Increased cortical echogenicity. Multiple cysts are present throughout the left kidney.  Abdominal aorta: No aneurysm visualized.  Other findings: Small amount of ascites is present.  IMPRESSION: Small amount of ascites.  Chronic changes of the kidneys are not significantly changed.   Electronically Signed   By: Marybelle Killings M.D.   On: 02/24/2015 08:39    Microbiology: No results found for this or any previous visit (from the past 240 hour(s)).   Labs: Basic Metabolic Panel:  Recent Labs Lab 02/21/15 1310 02/21/15 1926 02/22/15 0036 02/23/15 0736 02/24/15 0432 02/25/15 0732  NA 136  --  136 140 135 135  K 5.5*  --  5.9* 4.0 4.0 4.2  CL 103  --  99* 98* 95* 97*  CO2 17*  --  20* '25 26 25  ' GLUCOSE 95  --  115* 90 83 87  BUN 117*  --  115* 55* 30* 51*  CREATININE 14.10* 14.56* 14.74* 8.79* 5.99* 7.80*  CALCIUM 9.3  --  10.1 9.7 10.4* 9.7  MG 2.2  --   --   --   --   --   PHOS  --   --  9.9* 7.8* 7.5*  --    Liver Function Tests:  Recent Labs Lab 02/21/15 1310 02/22/15 0036 02/23/15 0736 02/24/15 0432  AST 16  --   --  21  ALT 12*  --   --  13*  ALKPHOS 236*  --   --  238*  BILITOT 0.5  --   --  0.8  PROT 7.7  --   --  7.7  ALBUMIN 3.5 3.5 3.4* 3.4*  3.4*   No results for input(s): LIPASE, AMYLASE in the last 168 hours. No results for input(s): AMMONIA in the last 168 hours. CBC:  Recent Labs Lab 02/21/15 1310 02/21/15 1926 02/22/15 0619 02/24/15 0432 02/25/15 0732  WBC 6.8 9.6 5.0 7.6 7.7  HGB 10.1* 10.9* 9.8* 10.5* 9.9*  HCT 30.5* 32.7* 30.2* 33.6* 31.7*  MCV 88.9 89.1 88.3 92.3 92.2  PLT PLATELETS APPEAR ADEQUATE 168 163 142* 149*   Cardiac Enzymes:  Recent Labs Lab 02/21/15 1310 02/21/15 1926 02/22/15 0036 02/22/15 0619  TROPONINI 0.30* 0.25* 0.28* 0.24*   BNP: BNP (last 3  results) No results for input(s): BNP in the last 8760 hours.  ProBNP (last 3 results) No results for input(s): PROBNP in the last 8760 hours.  CBG: No results for input(s): GLUCAP in the last 168 hours.     SignedDomenic Polite  [facility] 02/25/2015, 3:11 PM

## 2015-02-25 NOTE — Procedures (Signed)
I have personally attended this patient's dialysis session.   Goal of 4 kg (EDW 54.5 - she could have in fact gained weight but has not been at her own center for over a month for reassessment - this is the one we have been shooting for anyway) BP 130's to start and if drops will UF as tolerated No peripheral edema On 2K 2Ca pending lab results Left AVF 400 no issues  Camille Bal, MD [facility] [phone] Pager 02/25/2015, 7:37 AM

## 2015-02-25 NOTE — Progress Notes (Signed)
[facility] Rounding Note Subjective:  S/p HD X 2 and 3rd today Weight down 67 kg -->58 kg-->58.5 pre dialysis today with goal of around 4 kg (EDW 54.5)  No more peripheral edema, abd distension is less Feels "pretty good"  Objective Vital signs in last 24 hours: Filed Vitals:   02/24/15 2029 02/25/15 0424 02/25/15 0704 02/25/15 0730  BP: 118/81 130/58 117/93 138/67  Pulse: 87 82 89 86  Temp: 97.8 F (36.6 C) 98.6 F (37 C) 98 F (36.7 C)   TempSrc: Oral Oral Oral   Resp: 16 18 10 12   Height:      Weight: 59.104 kg (130 lb 4.8 oz)  58.5 kg (128 lb 15.5 oz)   SpO2: 100% 98% 98%    Weight change: 1.104 kg (2 lb 6.9 oz)  Intake/Output Summary (Last 24 hours) at 02/25/15 0739 Last data filed at 02/25/15 0150  Gross per 24 hour  Intake    231 ml  Output      0 ml  Net    231 ml    Physical Exam:  BP 138/67 mmHg  Pulse 86  Temp(Src) 98 F (36.7 C) (Oral)  Resp 12  Ht 5\' 3"  (1.6 m)  Wt 58.5 kg (128 lb 15.5 oz)  BMI 22.85 kg/m2  SpO2 98%  LMP 08/23/2014 (Approximate)  Pre HD weight 58.5 EDW 54.4 No JVD Lungs clear ICD in place right chest Abd protuberant non-tender, soft No edema Left upper arm AVF cannulated and BFR 400  Labs: Basic Metabolic Panel:   Renal labs not back yet this AM  Recent Labs Lab 02/21/15 1310 02/21/15 1926 02/22/15 0036 02/23/15 0736 02/24/15 0432  NA 136  --  136 140 135  K 5.5*  --  5.9* 4.0 4.0  CL 103  --  99* 98* 95*  CO2 17*  --  20* 25 26  GLUCOSE 95  --  115* 90 83  BUN 117*  --  115* 55* 30*  CREATININE 14.10* 14.56* 14.74* 8.79* 5.99*  CALCIUM 9.3  --  10.1 9.7 10.4*  PHOS  --   --  9.9* 7.8* 7.5*   Liver Function Tests:  Recent Labs Lab 02/21/15 1310 02/22/15 0036 02/23/15 0736 02/24/15 0432  AST 16  --   --  21  ALT 12*  --   --  13*  ALKPHOS 236*  --   --  238*  BILITOT 0.5  --   --  0.8  PROT 7.7  --   --  7.7  ALBUMIN 3.5 3.5 3.4* 3.4*  3.4*     Recent Labs Lab 02/21/15 1310  02/21/15 1926 02/22/15 0619 02/24/15 0432  WBC 6.8 9.6 5.0 7.6  HGB 10.1* 10.9* 9.8* 10.5*  HCT 30.5* 32.7* 30.2* 33.6*  MCV 88.9 89.1 88.3 92.3  PLT PLATELETS APPEAR ADEQUATE 168 163 142*    Recent Labs Lab 02/21/15 1310 02/21/15 1926 02/22/15 0036 02/22/15 0619  TROPONINI 0.30* 0.25* 0.28* 0.24*   Iron Studies:   Recent Labs Lab 02/22/15 0619  IRON 37  TIBC 192*  FERRITIN 576*  Transferrin saturation 19%  Studies/Results: US Abdomen Complete  02/24/2015   CLINICAL DATA:  Abdominal distention  EXAM: ULTRASOUND ABDOMEN COMPLETE  COMPARISON:  01/14/2013  FINDINGS: Gallbladder: No gallstones or wall thickening visualized. No sonographic Murphy sign noted.  Common bile duct: Diameter: 3 mm  Liver: No focal lesion identified. Within normal limits in parenchymal echogenicity.  IVC: No abnormality visualized.  Pancreas: Visualized  portion unremarkable.  Spleen: Size and appearance within normal limits.  Right Kidney: Length: 8.5 cm in length. There is increased echogenicity throughout the renal cortex. Multiple cysts are again noted.  Left Kidney: Length: 9.1 cm in length. Increased cortical echogenicity. Multiple cysts are present throughout the left kidney.  Abdominal aorta: No aneurysm visualized.  Other findings: Small amount of ascites is present.  IMPRESSION: Small amount of ascites.  Chronic changes of the kidneys are not significantly changed.   Electronically Signed   By: Jolaine Click M.D.   On: 02/24/2015 08:39   Medications:   . aspirin EC  81 mg Oral Daily  . calcium acetate  1,334 mg Oral TID WC  . carvedilol  6.25 mg Oral BID  . diphenhydrAMINE      . famotidine  20 mg Oral Daily  . ferric gluconate (FERRLECIT/NULECIT) IV  125 mg Intravenous Q T,Th,Sa-HD  . heparin  5,000 Units Subcutaneous 3 times per day  . isosorbide dinitrate  10 mg Oral TID  . lisinopril  2.5 mg Oral BID  . LORazepam  1 mg Oral Q T,Th,Sa-HD  . multivitamin  1 tablet Oral QHS  . pantoprazole   40 mg Oral Daily  . sertraline  100 mg Oral Daily  . sodium chloride  3 mL Intravenous Q12H  . sodium chloride  3 mL Intravenous Q12H   Dialysis prescription [facility] [address] [phone] EDW 54.5 kg.3 hours 45 minutes.Not there for entire month of April HD Bath 2K 2 Ca Polyflux 180 dialyzer. Heparin 2000 bolus. Access left upper arm AV fistula.  Assessment/Plan: 24 year old black female with long-standing end-stage renal disease presenting with essentially noncompliance with dialysis  1 Shortness of breath/pleuritic chest pain- CT angio negative.No chest pain this AM. Suspect driven more by volume than anything else                  2 ESRD: Her normal schedule is TTS via AV fistula in [place]. According to her unit no HD there for the entire month of April (they made her "inactive" but is not discharged from their unit) they state she was at [facility] there in [place] on the 27th of April..they report she is very poorly compliant. Weight down to within 4 kg of EDW. Hope will get her down to EDW with today's HD and she is back on her usual schedule now.  3 Hypertension: Improved with volume reduction and remains on carvedilol and lisinopril  4. 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  Not significantly decreased. I do not know her ESA history since most of her HD has been in hospitals for the last month. Iron studies show tsat of 19%. Added some IV FE with HD.  Gettng Aranesp here (60 mcg on 5/2)  5. Metabolic Bone Disease: Is noted to be on PhosLo which has been continued. Initial phos of 9.9 speaks against outpt compliance and phos is slowly getting better in the hospital  6. Chronic systolic heart failure with ICD in place. Last documented EF PTA was about 10% - repeat ECHO this admission improved EF to 40-45%!!  7. Ascites - Of note her abd US showed only a small amount of ascites.  From my perspective she could be discharged after her dialysis  today.    Camille Bal, MD [facility] [phone] Pager 02/25/2015, 7:39 AM

## 2015-03-02 ENCOUNTER — Encounter (HOSPITAL_BASED_OUTPATIENT_CLINIC_OR_DEPARTMENT_OTHER): Payer: Self-pay | Admitting: Emergency Medicine

## 2015-03-02 ENCOUNTER — Emergency Department (HOSPITAL_BASED_OUTPATIENT_CLINIC_OR_DEPARTMENT_OTHER): Payer: Medicare Other

## 2015-03-02 ENCOUNTER — Inpatient Hospital Stay (HOSPITAL_BASED_OUTPATIENT_CLINIC_OR_DEPARTMENT_OTHER)
Admission: EM | Admit: 2015-03-02 | Discharge: 2015-03-04 | DRG: 193 | Disposition: A | Discharge status: Home / Self Care | Payer: Medicare Other | Attending: Internal Medicine | Admitting: Internal Medicine

## 2015-03-02 DIAGNOSIS — R188 Other ascites: Secondary | ICD-10-CM | POA: Diagnosis present

## 2015-03-02 DIAGNOSIS — R7989 Other specified abnormal findings of blood chemistry: Secondary | ICD-10-CM | POA: Diagnosis present

## 2015-03-02 DIAGNOSIS — Z992 Dependence on renal dialysis: Secondary | ICD-10-CM | POA: Diagnosis not present

## 2015-03-02 DIAGNOSIS — L509 Urticaria, unspecified: Secondary | ICD-10-CM | POA: Diagnosis not present

## 2015-03-02 DIAGNOSIS — Z885 Allergy status to narcotic agent status: Secondary | ICD-10-CM | POA: Diagnosis not present

## 2015-03-02 DIAGNOSIS — I429 Cardiomyopathy, unspecified: Secondary | ICD-10-CM | POA: Diagnosis present

## 2015-03-02 DIAGNOSIS — Z87891 Personal history of nicotine dependence: Secondary | ICD-10-CM | POA: Diagnosis not present

## 2015-03-02 DIAGNOSIS — Y95 Nosocomial condition: Secondary | ICD-10-CM | POA: Diagnosis present

## 2015-03-02 DIAGNOSIS — R0781 Pleurodynia: Secondary | ICD-10-CM | POA: Diagnosis present

## 2015-03-02 DIAGNOSIS — R1110 Vomiting, unspecified: Secondary | ICD-10-CM | POA: Diagnosis present

## 2015-03-02 DIAGNOSIS — R112 Nausea with vomiting, unspecified: Secondary | ICD-10-CM | POA: Diagnosis present

## 2015-03-02 DIAGNOSIS — N19 Unspecified kidney failure: Secondary | ICD-10-CM

## 2015-03-02 DIAGNOSIS — I120 Hypertensive chronic kidney disease with stage 5 chronic kidney disease or end stage renal disease: Secondary | ICD-10-CM | POA: Diagnosis present

## 2015-03-02 DIAGNOSIS — D571 Sickle-cell disease without crisis: Secondary | ICD-10-CM | POA: Diagnosis present

## 2015-03-02 DIAGNOSIS — N186 End stage renal disease: Secondary | ICD-10-CM

## 2015-03-02 DIAGNOSIS — N2581 Secondary hyperparathyroidism of renal origin: Secondary | ICD-10-CM | POA: Diagnosis present

## 2015-03-02 DIAGNOSIS — Z9119 Patient's noncompliance with other medical treatment and regimen: Secondary | ICD-10-CM | POA: Diagnosis present

## 2015-03-02 DIAGNOSIS — J189 Pneumonia, unspecified organism: Principal | ICD-10-CM | POA: Diagnosis present

## 2015-03-02 DIAGNOSIS — Z888 Allergy status to other drugs, medicaments and biological substances status: Secondary | ICD-10-CM | POA: Diagnosis not present

## 2015-03-02 DIAGNOSIS — I5023 Acute on chronic systolic (congestive) heart failure: Secondary | ICD-10-CM | POA: Diagnosis present

## 2015-03-02 DIAGNOSIS — E875 Hyperkalemia: Secondary | ICD-10-CM | POA: Diagnosis present

## 2015-03-02 DIAGNOSIS — R778 Other specified abnormalities of plasma proteins: Secondary | ICD-10-CM | POA: Diagnosis present

## 2015-03-02 DIAGNOSIS — Q613 Polycystic kidney, unspecified: Secondary | ICD-10-CM | POA: Diagnosis not present

## 2015-03-02 DIAGNOSIS — I10 Essential (primary) hypertension: Secondary | ICD-10-CM

## 2015-03-02 LAB — COMPREHENSIVE METABOLIC PANEL
ALK PHOS: 268 U/L — AB (ref 38–126)
ALT: 13 U/L — ABNORMAL LOW (ref 14–54)
ANION GAP: 19 — AB (ref 5–15)
AST: 22 U/L (ref 15–41)
Albumin: 3.7 g/dL (ref 3.5–5.0)
BILIRUBIN TOTAL: 0.6 mg/dL (ref 0.3–1.2)
BUN: 92 mg/dL — AB (ref 6–20)
CHLORIDE: 98 mmol/L — AB (ref 101–111)
CO2: 20 mmol/L — AB (ref 22–32)
Calcium: 10.2 mg/dL (ref 8.9–10.3)
Creatinine, Ser: 11.78 mg/dL — ABNORMAL HIGH (ref 0.44–1.00)
GFR, EST AFRICAN AMERICAN: 5 mL/min — AB (ref >60)
GFR, EST NON AFRICAN AMERICAN: 4 mL/min — AB (ref >60)
GLUCOSE: 88 mg/dL (ref 70–99)
POTASSIUM: 5.2 mmol/L — AB (ref 3.5–5.1)
Sodium: 137 mmol/L (ref 135–145)
TOTAL PROTEIN: 7.8 g/dL (ref 6.5–8.1)

## 2015-03-02 LAB — LIPASE, BLOOD: LIPASE: 41 U/L (ref 22–51)

## 2015-03-02 LAB — MAGNESIUM: Magnesium: 2.3 mg/dL (ref 1.7–2.4)

## 2015-03-02 LAB — CBC
HCT: 32.4 % — ABNORMAL LOW (ref 36.0–46.0)
HEMOGLOBIN: 10.6 g/dL — AB (ref 12.0–15.0)
MCH: 29.5 pg (ref 26.0–34.0)
MCHC: 32.7 g/dL (ref 30.0–36.0)
MCV: 90.3 fL (ref 78.0–100.0)
Platelets: 148 10*3/uL — ABNORMAL LOW (ref 150–400)
RBC: 3.59 MIL/uL — AB (ref 3.87–5.11)
RDW: 17.6 % — ABNORMAL HIGH (ref 11.5–15.5)
WBC: 7.6 10*3/uL (ref 4.0–10.5)

## 2015-03-02 LAB — PHOSPHORUS: Phosphorus: 7.6 mg/dL — ABNORMAL HIGH (ref 2.5–4.6)

## 2015-03-02 MED ORDER — ACETAMINOPHEN 325 MG PO TABS: 650.0000 mg | ORAL_TABLET | Freq: Four times a day (QID) | ORAL | Status: DC | PRN

## 2015-03-02 MED ORDER — SODIUM CHLORIDE 0.9 % IJ SOLN
3.0000 mL | Freq: Two times a day (BID) | INTRAMUSCULAR | Status: DC
  Administered 2015-03-02 – 2015-03-04 (×4): 3 mL via INTRAVENOUS

## 2015-03-02 MED ORDER — HEPARIN SODIUM (PORCINE) 5000 UNIT/ML IJ SOLN
5000.0000 [IU] | Freq: Three times a day (TID) | INTRAMUSCULAR | Status: DC
  Filled 2015-03-02 (×7): qty 1

## 2015-03-02 MED ORDER — CALCIUM ACETATE (PHOS BINDER) 667 MG PO CAPS
2001.0000 mg | ORAL_CAPSULE | Freq: Three times a day (TID) | ORAL | Status: DC
  Administered 2015-03-03 – 2015-03-04 (×3): 2001 mg via ORAL
  Filled 2015-03-02 (×7): qty 3

## 2015-03-02 MED ORDER — LEVOFLOXACIN IN D5W 750 MG/150ML IV SOLN
750.0000 mg | Freq: Once | INTRAVENOUS | Status: AC
  Administered 2015-03-03: 750 mg via INTRAVENOUS
  Filled 2015-03-02 (×2): qty 150

## 2015-03-02 MED ORDER — HYDROMORPHONE HCL 1 MG/ML IJ SOLN
1.0000 mg | Freq: Once | INTRAMUSCULAR | Status: AC
  Administered 2015-03-02: 1 mg via INTRAVENOUS
  Filled 2015-03-02: qty 1

## 2015-03-02 MED ORDER — ONDANSETRON HCL 4 MG/2ML IJ SOLN: 4.0000 mg | Freq: Four times a day (QID) | INTRAMUSCULAR | Status: DC | PRN

## 2015-03-02 MED ORDER — OXYCODONE-ACETAMINOPHEN 5-325 MG PO TABS
1.0000 | ORAL_TABLET | Freq: Four times a day (QID) | ORAL | Status: DC | PRN
  Administered 2015-03-02 – 2015-03-03 (×4): 2 via ORAL
  Administered 2015-03-04: 1 via ORAL
  Administered 2015-03-04 (×2): 2 via ORAL
  Filled 2015-03-02 (×5): qty 2
  Filled 2015-03-02: qty 1
  Filled 2015-03-02: qty 2

## 2015-03-02 MED ORDER — ONDANSETRON HCL 4 MG/2ML IJ SOLN
4.0000 mg | Freq: Once | INTRAMUSCULAR | Status: AC
  Administered 2015-03-02: 4 mg via INTRAVENOUS
  Filled 2015-03-02: qty 2

## 2015-03-02 MED ORDER — ISOSORBIDE DINITRATE 10 MG PO TABS
10.0000 mg | ORAL_TABLET | Freq: Three times a day (TID) | ORAL | Status: DC
  Administered 2015-03-02 – 2015-03-04 (×4): 10 mg via ORAL
  Filled 2015-03-02 (×7): qty 1

## 2015-03-02 MED ORDER — DIPHENHYDRAMINE HCL 50 MG/ML IJ SOLN
25.0000 mg | Freq: Once | INTRAMUSCULAR | Status: AC
  Administered 2015-03-02: 25 mg via INTRAVENOUS
  Filled 2015-03-02: qty 1

## 2015-03-02 MED ORDER — PIPERACILLIN-TAZOBACTAM 3.375 G IVPB 30 MIN
3.3750 g | Freq: Once | INTRAVENOUS | Status: AC
  Administered 2015-03-02: 3.375 g via INTRAVENOUS
  Filled 2015-03-02 (×2): qty 50

## 2015-03-02 MED ORDER — ACETAMINOPHEN 650 MG RE SUPP: 650.0000 mg | Freq: Four times a day (QID) | RECTAL | Status: DC | PRN

## 2015-03-02 MED ORDER — CARVEDILOL 6.25 MG PO TABS
6.2500 mg | ORAL_TABLET | Freq: Two times a day (BID) | ORAL | Status: DC
  Administered 2015-03-02 – 2015-03-04 (×4): 6.25 mg via ORAL
  Filled 2015-03-02 (×6): qty 1

## 2015-03-02 MED ORDER — FAMOTIDINE 20 MG PO TABS
20.0000 mg | ORAL_TABLET | Freq: Every day | ORAL | Status: DC
  Administered 2015-03-02 – 2015-03-04 (×3): 20 mg via ORAL
  Filled 2015-03-02 (×3): qty 1

## 2015-03-02 MED ORDER — ONDANSETRON HCL 4 MG PO TABS: 4.0000 mg | ORAL_TABLET | Freq: Four times a day (QID) | ORAL | Status: DC | PRN

## 2015-03-02 MED ORDER — LORAZEPAM 1 MG PO TABS: 1.0000 mg | ORAL_TABLET | ORAL | Status: DC

## 2015-03-02 MED ORDER — LEVOFLOXACIN IN D5W 500 MG/100ML IV SOLN: 500.0000 mg | INTRAVENOUS | Status: DC

## 2015-03-02 MED ORDER — DIPHENHYDRAMINE HCL 25 MG PO CAPS
25.0000 mg | ORAL_CAPSULE | Freq: Two times a day (BID) | ORAL | Status: DC
  Administered 2015-03-02 – 2015-03-03 (×2): 25 mg via ORAL
  Filled 2015-03-02 (×3): qty 1

## 2015-03-02 MED ORDER — VANCOMYCIN HCL IN DEXTROSE 1-5 GM/200ML-% IV SOLN
1000.0000 mg | Freq: Once | INTRAVENOUS | Status: AC
  Administered 2015-03-02: 1000 mg via INTRAVENOUS
  Filled 2015-03-02: qty 200

## 2015-03-02 MED ORDER — CALCIUM ACETATE (PHOS BINDER) 667 MG PO CAPS: 1334.0000 mg | ORAL_CAPSULE | Freq: Two times a day (BID) | ORAL | Status: DC | PRN

## 2015-03-02 MED ORDER — SERTRALINE HCL 100 MG PO TABS
100.0000 mg | ORAL_TABLET | Freq: Every day | ORAL | Status: DC
  Administered 2015-03-03 – 2015-03-04 (×2): 100 mg via ORAL
  Filled 2015-03-02 (×2): qty 1

## 2015-03-02 NOTE — ED Provider Notes (Signed)
CSN: [id number]     Arrival date & time 03/02/15  1716 History   First MD Initiated Contact with Patient 03/02/15 1736     Chief Complaint  Patient presents with  . Pain     (Consider location/radiation/quality/duration/timing/severity/associated sxs/prior Treatment) Patient is a 24 y.o. female presenting with vomiting. The history is provided by the patient.  Emesis Severity:  Moderate Duration:  3 days Timing:  Constant Quality:  Stomach contents Progression:  Unchanged Chronicity:  New Recent urination:  Normal Relieved by:  Nothing Worsened by:  Nothing tried Associated symptoms: no abdominal pain, no chills, no cough and no diarrhea     Past Medical History  Diagnosis Date  . Hemodialysis patient   . Hypertension   . Pulmonary emboli 01/2012    Bilateral, moderate clot burden, areas of pulmonary infarction and central necrosis  . CHF (congestive heart failure)   . Cardiomyopathy   . Dysrhythmia     at times per pt.  . Anemia   . H/O transfusion of packed red blood cells   . 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 Polycystic kidney disease   . Cellulitis and abscess of face 03/22/2013  . Renal insufficiency   . Sickle cell anemia    Past Surgical History  Procedure Laterality Date  . Nephrectomy    . Av fistula placement    . Kidney transplant  2008    failed  . Tonsillectomy      as a child.  . Adenoidectomy    . Incision and drainage abscess Right 03/21/2013    Procedure: INCISION AND DRAINAGE RIGHT CHEEK ABSCESS REMOVAL OF FOREIGN BODY;  Surgeon: Serena Colonel, MD;  Location: [facility] OR;  Service: ENT;  Laterality: Right;  . Implantable cardioverter defibrillator implant Right 12/2014   Family History  Problem Relation Age of Onset  . Polycystic kidney disease Father    History  Substance Use Topics  . Smoking status: Former Smoker -- 0.20 packs/day    Quit  date: 05/06/2012  . Smokeless tobacco: Never Used  . Alcohol Use: No   OB History    No data available     Review of Systems  Constitutional: Negative for fever and chills.  Respiratory: Negative for cough and shortness of breath.   Cardiovascular: Negative for chest pain.  Gastrointestinal: Positive for vomiting. Negative for abdominal pain and diarrhea.  All other systems reviewed and are negative.     Allergies  Tramadol; Iohexol; Morphine and related; and Vicodin  Home Medications   Prior to Admission medications   Medication Sig Start Date End Date Taking? Authorizing Provider  B Complex-C-Folic Acid (DIALYVITE PO) Take 1 tablet by mouth.    Historical Provider, MD  calcium acetate (PHOSLO) 667 MG capsule Take 2 capsules (1,334 mg total) by mouth 3 (three) times daily with meals. Take  with snacks Patient taking differently: Take 1,334-2,001 mg by mouth See admin instructions. Take 3 capsules (2001 mg) with meals and 2 capsules (1334 mg) with snacks 05/01/13   [name], MD  carvedilol (COREG) 6.25 MG tablet Take 1 tablet (6.25 mg total) by mouth 2 (two) times daily. Patient not taking: Reported on 02/22/2015 01/29/15   Joseph Art, DO  famotidine (PEPCID) 20 MG tablet Take 20 mg by mouth daily as needed for heartburn.  03/24/13   Elease Etienne, MD  isosorbide dinitrate (ISORDIL) 10 MG tablet  Take 1 tablet (10 mg total) by mouth 3 (three) times daily. 02/25/15   Zannie Cove, MD  lisinopril (PRINIVIL,ZESTRIL) 2.5 MG tablet Take 1 tablet (2.5 mg total) by mouth 2 (two) times daily. Patient taking differently: Take 2.5 mg by mouth daily.  01/29/15 01/29/16  Joseph Art, DO  LORazepam (ATIVAN) 1 MG tablet Take 1 tablet (1 mg total) by mouth See admin instructions. On dialysis days, before dialysis Patient taking differently: Take 1 mg by mouth 3 (three) times a week. Take 1 tablet (1 mg) prior to dialysis on Tuesday, Thursday and Saturday 01/29/15   Joseph Art, DO   oxyCODONE-acetaminophen (PERCOCET/ROXICET) 5-325 MG per tablet Take 1 tablet by mouth every 8 (eight) hours as needed for severe pain. 02/25/15   Zannie Cove, MD  sertraline (ZOLOFT) 100 MG tablet Take 100 mg by mouth daily.    Historical Provider, MD   There were no vitals taken for this visit. Physical Exam  Constitutional: She is oriented to person, place, and time. She appears well-developed and well-nourished. No distress.  HENT:  Head: Normocephalic and atraumatic.  Mouth/Throat: Oropharynx is clear and moist.  Facial swelling in her cheeks and infraorbital areas  Eyes: EOM are normal. Pupils are equal, round, and reactive to light.  Neck: Normal range of motion. Neck supple. No JVD present. No tracheal deviation present. No thyromegaly present.  Cardiovascular: Normal rate and regular rhythm.  Exam reveals no friction rub.   No murmur heard. Pulmonary/Chest: Effort normal and breath sounds normal. No respiratory distress. She has no wheezes. She has no rales.  Abdominal: Soft. She exhibits no distension. There is no tenderness. There is no rebound.  Musculoskeletal: Normal range of motion. She exhibits no edema.  Neurological: She is alert and oriented to person, place, and time.  Skin: No rash noted. She is not diaphoretic.  Nursing note and vitals reviewed.   ED Course  Procedures (including critical care time) Labs Review Labs Reviewed  CBC - Abnormal; Notable for the following:    RBC 3.59 (*)    Hemoglobin 10.6 (*)    HCT 32.4 (*)    RDW 17.6 (*)    Platelets 148 (*)    All other components within normal limits  COMPREHENSIVE METABOLIC PANEL - Abnormal; Notable for the following:    Potassium 5.2 (*)    Chloride 98 (*)    CO2 20 (*)    BUN 92 (*)    Creatinine, Ser 11.78 (*)    ALT 13 (*)    Alkaline Phosphatase 268 (*)    GFR calc non Af Amer 4 (*)    GFR calc Af Amer 5 (*)    Anion gap 19 (*)    All other components within normal limits  PHOSPHORUS -  Abnormal; Notable for the following:    Phosphorus 7.6 (*)    All other components within normal limits  CULTURE, BLOOD (ROUTINE X 2)  CULTURE, BLOOD (ROUTINE X 2)  LIPASE, BLOOD  MAGNESIUM    Imaging Review Dg Chest 2 View  03/02/2015   CLINICAL DATA:  Chronic renal failure. Cardiac arrhythmia. Generalized body aching  EXAM: CHEST  2 VIEW  COMPARISON:  None.  FINDINGS: There is a focal area of infiltrate in the right lower lobe. Lungs elsewhere clear. Heart is enlarged with pulmonary vascularity within normal limits. Pacemaker lead is attached to the right ventricle. No adenopathy. No bone lesions. There is a filter in the inferior vena cava.  IMPRESSION:  Infiltrate right lower lobe. Cardiomegaly. Pacemaker as described. No pneumothorax.   Electronically Signed   By: Bretta Bang III M.D.   On: 03/02/2015 19:16     EKG Interpretation None      MDM   Final diagnoses:  Vomiting  HCAP (healthcare-associated pneumonia)  Uremia    24 year old female with history of end-stage renal disease on Tuesday Thursday Saturday dialysis presents with vomiting for 3 days. She also states she's having some bony pain in her collarbone from her prior metabolic bone disease. She states she is missed her last 2 dialysis sessions. Of note she is recently admitted for similar symptoms and require dialysis in the hospital. She has no abdominal pain and vitals are stable here. She does have some facial swelling, which is where she says she retains fluid when she is overloaded. We'll plan on labs, chest x-ray, anti-emetics. Plan for likely admission for dialysis.  CXR shows HCAP. BMP with no hyperkalemia, but she is fairly uremic.  Admitted to [facility].  I have reviewed all labs and imaging and considered them in my medical decision making.   Elwin Mocha, MD 03/02/15 2024

## 2015-03-02 NOTE — ED Notes (Signed)
RT at bedside to draw blood cultures

## 2015-03-02 NOTE — ED Notes (Signed)
Pt in c/o generalized body pain due to "bone disease" pt states is from her renal condition. In NAD. Pt is dialysis pt, last tx was one week ago.

## 2015-03-02 NOTE — Progress Notes (Signed)
ANTIBIOTIC CONSULT NOTE - INITIAL  Pharmacy Consult for Levaquin Indication: pneumonia  Allergies  Allergen Reactions  . Tramadol Anaphylaxis  . Iohexol Itching  . Morphine And Related Itching    Ok with oxycodone  . Vicodin [Hydrocodone-Acetaminophen] Hives    Patient Measurements: Height: 5\' 3"  (160 cm) Weight: 121 lb 4.1 oz (55 kg) IBW/kg (Calculated) : 52.4  Vital Signs: Temp: 98 Small (36.7 C) (05/10 2122) Temp Source: Oral (05/10 2122) BP: 137/97 mmHg (05/10 2122) Pulse Rate: 94 (05/10 2122) Intake/Output from previous day:   Intake/Output from this shift:    Labs:  Recent Labs  03/02/15 1800  WBC 7.6  HGB 10.6*  PLT 148*  CREATININE 11.78*   Estimated Creatinine Clearance: 6.1 mL/min (by C-G formula based on Cr of 11.78).  Microbiology: No results found for this or any previous visit (from the past 720 hour(s)).  Medical History: Past Medical History  Diagnosis Date  . Hemodialysis patient   . Hypertension   . Pulmonary emboli 01/2012    Bilateral, moderate clot burden, areas of pulmonary infarction and central necrosis  . CHF (congestive heart failure)   . Cardiomyopathy   . Dysrhythmia     at times per pt.  . Anemia   . H/O transfusion of packed red blood cells   . 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 Polycystic kidney disease   . Cellulitis and abscess of face 03/22/2013  . Renal insufficiency   . Sickle cell anemia     Medications:  Scheduled:  . calcium acetate  1,334-2,001 mg Oral See admin instructions  . carvedilol  6.25 mg Oral BID  . diphenhydrAMINE  25 mg Oral BID  . famotidine  20 mg Oral Daily  . heparin  5,000 Units Subcutaneous 3 times per day  . isosorbide dinitrate  10 mg Oral TID  . [START ON 03/03/2015] LORazepam  1 mg Oral Once per day on Mon Wed Fri  . [START ON 03/03/2015] sertraline  100 mg Oral Daily  . sodium chloride   3 mL Intravenous Q12H   Assessment: 24 yo Small presented to ED 03/02/2015 with 3d hx of nausea/vomiting.  Pt has missed her last two dialysis sessions.  CXR shows HCAP >> to start Levaquin.  Goal of Therapy:  Renal dose adjustment of antibiotics  Plan:  Levaquin 750 mg IV x 1 tonight. Followed by Levaquin 500 mg IV q48h Follow up clinical progress and change to PO when appropriate.  [employer], Pharm.D., BCPS Clinical Pharmacist Pager [phone] 03/02/2015 10:27 PM

## 2015-03-02 NOTE — H&P (Signed)
[facility] History and Physical  Patient: [name] Small  MRN: [id number]  DOB: [birth year]  DOS: the patient was seen and examined on 03/02/2015 PCP: Ellin Saba  Referring physician: Dr. Gwendolyn Grant Chief Complaint: Chest pain  HPI: [name] Small is a 24 y.o. female with Past medical history of ESRD on hemodialysis noncompliant, history of CHF with systolic dysfunction, essential hypertension, chronic anemia secondary to ESRD. The patient is presenting with complaints of generalized body ache and chest pain. She mentions she has missed 2 sessions of hemodialysis. She mentions that the chest pain started yesterday and has been a constant pain. This like a sharp stabbing pain. Located on the right side. Also since last 3 days she has been having nausea with vomiting. At present she does not have any further nausea. Does not have any abdominal pain does not have any constipation or diarrhea. Denies any swelling of her legs and orthopnea PND. She mentions she is compliant with all her medications. She is requesting Dilaudid.  The patient is coming from home. And at her baseline independent for most of her ADL.  Review of Systems: as mentioned in the history of present illness.  A comprehensive review of the other systems is negative.  Past Medical History  Diagnosis Date  . Hemodialysis patient   . Hypertension   . Pulmonary emboli 01/2012    Bilateral, moderate clot burden, areas of pulmonary infarction and central necrosis  . CHF (congestive heart failure)   . Cardiomyopathy   . Dysrhythmia     at times per pt.  . Anemia   . H/O transfusion of packed red blood cells   . 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 Polycystic kidney disease   . Cellulitis and abscess of face 03/22/2013  . Renal insufficiency   . Sickle cell anemia    Past Surgical History  Procedure  Laterality Date  . Nephrectomy    . Av fistula placement    . Kidney transplant  2008    failed  . Tonsillectomy      as a child.  . Adenoidectomy    . Incision and drainage abscess Right 03/21/2013    Procedure: INCISION AND DRAINAGE RIGHT CHEEK ABSCESS REMOVAL OF FOREIGN BODY;  Surgeon: Serena Colonel, MD;  Location: [facility] OR;  Service: ENT;  Laterality: Right;  . Implantable cardioverter defibrillator implant Right 12/2014   Social History:  reports that she quit smoking about 2 years ago. She has never used smokeless tobacco. She reports that she does not drink alcohol or use illicit drugs.  Allergies  Allergen Reactions  . Tramadol Anaphylaxis  . Iohexol Itching  . Morphine And Related Itching    Ok with oxycodone  . Vicodin [Hydrocodone-Acetaminophen] Hives  . Zosyn [Piperacillin Sod-Tazobactam So] Hives    Pt has received zosyn, vancomycin, benadryl and dilaudid, developed hives, has gotten other medications in the past multiple times so assuming zosyn as the cause.    Family History  Problem Relation Age of Onset  . Polycystic kidney disease Father     Prior to Admission medications   Medication Sig Start Date End Date Taking? Authorizing Provider  LORazepam (ATIVAN) 1 MG tablet Take 1 tablet (1 mg total) by mouth See admin instructions. On dialysis days, before dialysis Patient taking differently: Take 1 mg by mouth 3 (three) times a week. Take 1 tablet (1 mg) prior to dialysis on  Tuesday, Thursday and Saturday 01/29/15  Yes Jessica U Vann, DO  B Complex-C-Folic Acid (DIALYVITE PO) Take 1 tablet by mouth.    Historical Provider, MD  calcium acetate (PHOSLO) 667 MG capsule Take 2 capsules (1,334 mg total) by mouth 3 (three) times daily with meals. Take  with snacks Patient taking differently: Take 1,334-2,001 mg by mouth See admin instructions. Take 3 capsules (2001 mg) with meals and 2 capsules (1334 mg) with snacks 05/01/13   [name], MD  carvedilol (COREG) 6.25 MG  tablet Take 1 tablet (6.25 mg total) by mouth 2 (two) times daily. Patient not taking: Reported on 02/22/2015 01/29/15   Joseph Art, DO  famotidine (PEPCID) 20 MG tablet Take 20 mg by mouth daily as needed for heartburn.  03/24/13   Elease Etienne, MD  isosorbide dinitrate (ISORDIL) 10 MG tablet Take 1 tablet (10 mg total) by mouth 3 (three) times daily. 02/25/15   Zannie Cove, MD  lisinopril (PRINIVIL,ZESTRIL) 2.5 MG tablet Take 1 tablet (2.5 mg total) by mouth 2 (two) times daily. Patient taking differently: Take 2.5 mg by mouth daily.  01/29/15 01/29/16  Joseph Art, DO  oxyCODONE-acetaminophen (PERCOCET/ROXICET) 5-325 MG per tablet Take 1 tablet by mouth every 8 (eight) hours as needed for severe pain. 02/25/15   Zannie Cove, MD  sertraline (ZOLOFT) 100 MG tablet Take 100 mg by mouth daily.    Historical Provider, MD    Physical Exam: Filed Vitals:   03/02/15 1935 03/02/15 2020 03/02/15 2122  BP: 157/113 144/108 137/97  Pulse: 113 103 94  Temp: 98.6 F (37 C) 98.4 F (36.9 C) 98 F (36.7 C)  TempSrc: Oral  Oral  Resp: Height:    (1.6 m)  Weight:   55 kg (121 lb 4.1 oz)  SpO2: 97% 100% 100%    General: Alert, Awake and Oriented to Time, Place and Person. Appear in mild distress Eyes: PERRL ENT: Oral Mucosa clear moist. Neck: no JVD Cardiovascular: S1 and S2 Present, aortic systolic Murmur, Peripheral Pulses Present Respiratory: Bilateral Air entry equal and Decreased,  Clear to Auscultation, no Crackles, no wheezes Abdomen: Bowel Sound present distended, Soft and diffusely mildly tender Skin: no Rash Extremities: Bilateral Pedal edema, no calf tenderness Neurologic: Grossly no focal neuro deficit.  Labs on Admission:  CBC:  Recent Labs Lab 02/24/15 0432 02/25/15 0732 03/02/15 1800  WBC 7.6 7.7 7.6  HGB 10.5* 9.9* 10.6*  HCT 33.6* 31.7* 32.4*  MCV 92.3 92.2 90.3  PLT 142* 149* 148*    CMP     Component Value Date/Time   NA 137 03/02/2015 1800     K 5.2* 03/02/2015 1800   CL 98* 03/02/2015 1800   CO2 20* 03/02/2015 1800   GLUCOSE 88 03/02/2015 1800   BUN 92* 03/02/2015 1800   CREATININE 11.78* 03/02/2015 1800   CALCIUM 10.2 03/02/2015 1800   PROT 7.8 03/02/2015 1800   ALBUMIN 3.7 03/02/2015 1800   AST 22 03/02/2015 1800   ALT 13* 03/02/2015 1800   ALKPHOS 268* 03/02/2015 1800   BILITOT 0.6 03/02/2015 1800   GFRNONAA 4* 03/02/2015 1800   GFRAA 5* 03/02/2015 1800     Recent Labs Lab 03/02/15 1800  LIPASE 41    No results for input(s): CKTOTAL, CKMB, CKMBINDEX, TROPONINI in the last 168 hours. BNP (last 3 results) No results for input(s): BNP in the last 8760 hours.  ProBNP (last 3 results) No results for input(s): PROBNP in the  last 8760 hours.   Radiological Exams on Admission: Dg Chest 2 View  03/02/2015   CLINICAL DATA:  Chronic renal failure. Cardiac arrhythmia. Generalized body aching  EXAM: CHEST  2 VIEW  COMPARISON:  None.  FINDINGS: There is a focal area of infiltrate in the right lower lobe. Lungs elsewhere clear. Heart is enlarged with pulmonary vascularity within normal limits. Pacemaker lead is attached to the right ventricle. No adenopathy. No bone lesions. There is a filter in the inferior vena cava.  IMPRESSION: Infiltrate right lower lobe. Cardiomegaly. Pacemaker as described. No pneumothorax.   Electronically Signed   By: Bretta Bang III M.D.   On: 03/02/2015 19:16   EKG: Independently reviewed. normal sinus rhythm, nonspecific ST and T waves changes.  Assessment/Plan Principal Problem:   HCAP (healthcare-associated pneumonia) Active Problems:   ESRD (end stage renal disease)   HTN (hypertension)   Pleuritic chest pain   Nausea with vomiting   Ascites   Hyperkalemia   Chronically Elevated troponin   Uremia   Hives   1. HCAP (healthcare-associated pneumonia) The patient is presenting with numbness of nausea vomiting generalized body ache as well as a right-sided chest pain. EKG does  not show any acute abnormality. Chest x-ray shows that she has a right-sided infiltrate. Probably aspiration related event. Due to her history of chronic hemodialysis she will be treated as healthcare associated pneumonia. Initially she has received vancomycin and Zosyn. Next and she has developed hives after receiving his antibiotics. She has allergies with morphine causing hives. Next and she has received vancomycin in the past and tolerated it well. She has never received Zosyn or any other penicillin here in our system. Therefore I would hold off on penicillin and place it as her allergy. We will treat her with Levaquin. Monitor cultures.  2. ESRD on hemodialysis. Patient is noncompliant with hemodialysis has recurrent admissions with volume overload in the past. Most likely her nausea vomiting as well as pleuritic chest painassociated with uremia. Nephrology will be consulted for continuation of hemodialysis. At present no indication for acute hemodialysis.  3. Pleuritic chest pain. Most likely associated with uremia. She has chronically elevated troponin secondary to ESRD. Continue monitoring serial troponin.  4. Essential hypertension. Continuing home medications.  5. Chronic anemia. Monitor H&H.  Advance goals of care discussion: Full code   Consults: Nephrology dialysis line was called.  DVT Prophylaxis: subcutaneous Heparin Nutrition: Nothing by mouth after midnight  Disposition: Admitted as inpatient, telemetry unit.  Author: Lynden Oxford, MD [facility] Pager: [phone] 03/02/2015  If 7PM-7AM, please contact night-coverage [email or web address] Password [email or web address]

## 2015-03-02 NOTE — ED Notes (Signed)
Unable to draw second blood culture.  RT attempted several times.  Cancelled due to patients best interest.  Hung IV abt

## 2015-03-03 ENCOUNTER — Inpatient Hospital Stay (HOSPITAL_COMMUNITY): Payer: Medicare Other

## 2015-03-03 DIAGNOSIS — R778 Other specified abnormalities of plasma proteins: Secondary | ICD-10-CM | POA: Diagnosis present

## 2015-03-03 DIAGNOSIS — R7989 Other specified abnormal findings of blood chemistry: Secondary | ICD-10-CM | POA: Diagnosis present

## 2015-03-03 DIAGNOSIS — L509 Urticaria, unspecified: Secondary | ICD-10-CM | POA: Diagnosis present

## 2015-03-03 LAB — CBC WITH DIFFERENTIAL/PLATELET
BASOS PCT: 0 % (ref 0–1)
Basophils Absolute: 0 10*3/uL (ref 0.0–0.1)
EOS ABS: 0.1 10*3/uL (ref 0.0–0.7)
Eosinophils Relative: 1 % (ref 0–5)
HCT: 29.2 % — ABNORMAL LOW (ref 36.0–46.0)
HEMOGLOBIN: 9.4 g/dL — AB (ref 12.0–15.0)
LYMPHS ABS: 2.5 10*3/uL (ref 0.7–4.0)
Lymphocytes Relative: 36 % (ref 12–46)
MCH: 28.9 pg (ref 26.0–34.0)
MCHC: 32.2 g/dL (ref 30.0–36.0)
MCV: 89.8 fL (ref 78.0–100.0)
MONOS PCT: 9 % (ref 3–12)
Monocytes Absolute: 0.6 10*3/uL (ref 0.1–1.0)
NEUTROS ABS: 3.9 10*3/uL (ref 1.7–7.7)
NEUTROS PCT: 54 % (ref 43–77)
PLATELETS: 144 10*3/uL — AB (ref 150–400)
RBC: 3.25 MIL/uL — AB (ref 3.87–5.11)
RDW: 17.9 % — ABNORMAL HIGH (ref 11.5–15.5)
WBC: 7.1 10*3/uL (ref 4.0–10.5)

## 2015-03-03 LAB — RENAL FUNCTION PANEL
ANION GAP: 17 — AB (ref 5–15)
ANION GAP: 20 — AB (ref 5–15)
Albumin: 2.9 g/dL — ABNORMAL LOW (ref 3.5–5.0)
Albumin: 3.2 g/dL — ABNORMAL LOW (ref 3.5–5.0)
BUN: 100 mg/dL — AB (ref 6–20)
BUN: 106 mg/dL — ABNORMAL HIGH (ref 6–20)
CALCIUM: 9.7 mg/dL (ref 8.9–10.3)
CHLORIDE: 99 mmol/L — AB (ref 101–111)
CO2: 18 mmol/L — ABNORMAL LOW (ref 22–32)
CO2: 17 mmol/L — ABNORMAL LOW (ref 22–32)
Calcium: 10 mg/dL (ref 8.9–10.3)
Chloride: 100 mmol/L — ABNORMAL LOW (ref 101–111)
Creatinine, Ser: 12.4 mg/dL — ABNORMAL HIGH (ref 0.44–1.00)
Creatinine, Ser: 12.86 mg/dL — ABNORMAL HIGH (ref 0.44–1.00)
GFR calc Af Amer: 4 mL/min — ABNORMAL LOW (ref >60)
GFR calc Af Amer: 4 mL/min — ABNORMAL LOW (ref >60)
GFR calc non Af Amer: 4 mL/min — ABNORMAL LOW (ref >60)
GFR calc non Af Amer: 4 mL/min — ABNORMAL LOW (ref >60)
GLUCOSE: 88 mg/dL (ref 70–99)
GLUCOSE: 109 mg/dL — AB (ref 70–99)
PHOSPHORUS: 8.2 mg/dL — AB (ref 2.5–4.6)
PHOSPHORUS: 8.9 mg/dL — AB (ref 2.5–4.6)
POTASSIUM: 5.4 mmol/L — AB (ref 3.5–5.1)
Potassium: 6.7 mmol/L (ref 3.5–5.1)
SODIUM: 135 mmol/L (ref 135–145)
Sodium: 136 mmol/L (ref 135–145)

## 2015-03-03 LAB — CBC
HCT: 30.3 % — ABNORMAL LOW (ref 36.0–46.0)
Hemoglobin: 9.8 g/dL — ABNORMAL LOW (ref 12.0–15.0)
MCH: 28.9 pg (ref 26.0–34.0)
MCHC: 32.3 g/dL (ref 30.0–36.0)
MCV: 89.4 fL (ref 78.0–100.0)
Platelets: 156 10*3/uL (ref 150–400)
RBC: 3.39 MIL/uL — ABNORMAL LOW (ref 3.87–5.11)
RDW: 17.7 % — ABNORMAL HIGH (ref 11.5–15.5)
WBC: 7.2 10*3/uL (ref 4.0–10.5)

## 2015-03-03 LAB — TROPONIN I
TROPONIN I: 0.14 ng/mL — AB (ref <0.031)
Troponin I: 0.15 ng/mL — ABNORMAL HIGH (ref <0.031)
Troponin I: 0.16 ng/mL — ABNORMAL HIGH (ref <0.031)

## 2015-03-03 MED ORDER — SODIUM CHLORIDE 0.9 % IV SOLN
125.0000 mg | INTRAVENOUS | Status: DC
  Filled 2015-03-03: qty 10

## 2015-03-03 MED ORDER — HYDROMORPHONE HCL 1 MG/ML IJ SOLN
0.5000 mg | Freq: Once | INTRAMUSCULAR | Status: AC | PRN
  Administered 2015-03-03: 0.5 mg via INTRAVENOUS
  Filled 2015-03-03: qty 1

## 2015-03-03 MED ORDER — SODIUM CHLORIDE 0.9 % IV SOLN: 100.0000 mL | INTRAVENOUS | Status: DC | PRN

## 2015-03-03 MED ORDER — SODIUM CHLORIDE 0.9 % IV SOLN
125.0000 mg | INTRAVENOUS | Status: DC
  Administered 2015-03-03: 125 mg via INTRAVENOUS
  Filled 2015-03-03: qty 10

## 2015-03-03 MED ORDER — LIDOCAINE HCL (PF) 1 % IJ SOLN: 5.0000 mL | INTRAMUSCULAR | Status: DC | PRN

## 2015-03-03 MED ORDER — PENTAFLUOROPROP-TETRAFLUOROETH EX AERO: 1.0000 "application " | INHALATION_SPRAY | CUTANEOUS | Status: DC | PRN

## 2015-03-03 MED ORDER — VANCOMYCIN HCL 500 MG IV SOLR
500.0000 mg | Freq: Once | INTRAVENOUS | Status: AC
  Administered 2015-03-03: 500 mg via INTRAVENOUS
  Filled 2015-03-03: qty 500

## 2015-03-03 MED ORDER — RENA-VITE PO TABS
1.0000 | ORAL_TABLET | Freq: Every day | ORAL | Status: DC
  Administered 2015-03-03: 1 via ORAL
  Filled 2015-03-03 (×2): qty 1

## 2015-03-03 MED ORDER — OXYCODONE HCL 5 MG PO TABS
5.0000 mg | ORAL_TABLET | Freq: Once | ORAL | Status: AC
Start: 2015-03-03 — End: 2015-03-03
  Administered 2015-03-03: 5 mg via ORAL
  Filled 2015-03-03: qty 1

## 2015-03-03 MED ORDER — ALTEPLASE 2 MG IJ SOLR
2.0000 mg | Freq: Once | INTRAMUSCULAR | Status: DC | PRN
  Filled 2015-03-03: qty 2

## 2015-03-03 MED ORDER — CINACALCET HCL 30 MG PO TABS
30.0000 mg | ORAL_TABLET | Freq: Every day | ORAL | Status: DC
  Filled 2015-03-03 (×2): qty 1

## 2015-03-03 MED ORDER — DARBEPOETIN ALFA 40 MCG/0.4ML IJ SOSY: 40.0000 ug | PREFILLED_SYRINGE | INTRAMUSCULAR | Status: DC

## 2015-03-03 MED ORDER — PIPERACILLIN-TAZOBACTAM IN DEX 2-0.25 GM/50ML IV SOLN
2.2500 g | Freq: Three times a day (TID) | INTRAVENOUS | Status: DC
  Administered 2015-03-03 – 2015-03-04 (×2): 2.25 g via INTRAVENOUS
  Filled 2015-03-03 (×3): qty 50

## 2015-03-03 MED ORDER — DARBEPOETIN ALFA 40 MCG/0.4ML IJ SOSY
40.0000 ug | PREFILLED_SYRINGE | INTRAMUSCULAR | Status: DC
  Administered 2015-03-03: 40 ug via INTRAVENOUS

## 2015-03-03 MED ORDER — HEPARIN SODIUM (PORCINE) 1000 UNIT/ML DIALYSIS: 1000.0000 [IU] | INTRAMUSCULAR | Status: DC | PRN

## 2015-03-03 MED ORDER — LIDOCAINE-PRILOCAINE 2.5-2.5 % EX CREA
1.0000 "application " | TOPICAL_CREAM | CUTANEOUS | Status: DC | PRN
  Filled 2015-03-03: qty 5

## 2015-03-03 MED ORDER — DIPHENHYDRAMINE HCL 25 MG PO CAPS
25.0000 mg | ORAL_CAPSULE | Freq: Three times a day (TID) | ORAL | Status: DC
  Administered 2015-03-03 – 2015-03-04 (×3): 25 mg via ORAL
  Filled 2015-03-03 (×6): qty 1

## 2015-03-03 MED ORDER — HYDROMORPHONE HCL 1 MG/ML IJ SOLN
0.5000 mg | Freq: Once | INTRAMUSCULAR | Status: AC
  Administered 2015-03-03: 0.5 mg via INTRAVENOUS
  Filled 2015-03-03: qty 1

## 2015-03-03 MED ORDER — NEPRO/CARBSTEADY PO LIQD
237.0000 mL | ORAL | Status: DC | PRN
  Filled 2015-03-03: qty 237

## 2015-03-03 MED ORDER — DARBEPOETIN ALFA 40 MCG/0.4ML IJ SOSY
PREFILLED_SYRINGE | INTRAMUSCULAR | Status: AC
  Administered 2015-03-03: 40 ug via INTRAVENOUS
  Filled 2015-03-03: qty 0.4

## 2015-03-03 MED ORDER — HEPARIN SODIUM (PORCINE) 1000 UNIT/ML DIALYSIS: 20.0000 [IU]/kg | Freq: Once | INTRAMUSCULAR | Status: DC

## 2015-03-03 NOTE — Consult Note (Signed)
Indication for Consultation:  Management of ESRD/hemodialysis; anemia, hypertension/volume and secondary hyperparathyroidism  HPI: [name] Small is a 24 y.o. female who admitted to the ED with complaints of 'bone pain' in her R clavicle, she reports the pain is caused by kidney disease- no acute injury. She receives HD in [place] but has not actually been there over a month. She was recently admitted to [facility] 5/1-5/5 for volume overload secondary to missed HD. She has not been to HD since DC. She reports she no longer lives in [place] nor does she have anyone she can live with, she is staying with family in [place] but states nobody will accept her for HD in this area because of her noncompliance. She has many excuses, 'life issues' that are preventing her from getting routine HD. Tells me that she is essentially homeless - has no place to stay in [place]- can live with her parents in [place] but thinks will not be accepted here- "I was 18 then- I am 23 now"  Past Medical History  Diagnosis Date  . Hemodialysis patient   . Hypertension   . Pulmonary emboli 01/2012    Bilateral, moderate clot burden, areas of pulmonary infarction and central necrosis  . CHF (congestive heart failure)   . Cardiomyopathy   . Dysrhythmia     at times per pt.  . Anemia   . H/O transfusion of packed red blood cells   . 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 Polycystic kidney disease   . Cellulitis and abscess of face 03/22/2013  . Renal insufficiency   . Sickle cell anemia    Past Surgical History  Procedure Laterality Date  . Nephrectomy    . Av fistula placement    . Kidney transplant  2008    failed  . Tonsillectomy      as a child.  . Adenoidectomy    . Incision and drainage abscess Right 03/21/2013    Procedure: INCISION AND DRAINAGE RIGHT CHEEK ABSCESS REMOVAL OF FOREIGN BODY;  Surgeon: Serena Colonel, MD;  Location: [facility] OR;  Service: ENT;  Laterality: Right;  . Implantable cardioverter defibrillator implant Right 12/2014   Family History  Problem Relation Age of Onset  . Polycystic kidney disease Father    Social History:  reports that she quit smoking about 2 years ago. She has never used smokeless tobacco. She reports that she does not drink alcohol or use illicit drugs. Allergies  Allergen Reactions  . Tramadol Anaphylaxis  . Iohexol Itching  . Morphine And Related Itching    Ok with oxycodone  . Vicodin [Hydrocodone-Acetaminophen] Hives  . Zosyn [Piperacillin Sod-Tazobactam So] Hives    Pt has received zosyn, vancomycin, benadryl and dilaudid, developed hives, has gotten other medications in the past multiple times so assuming zosyn as the cause.   Prior to Admission medications   Medication Sig Start Date End Date Taking? Authorizing Provider  B Complex-C-Folic Acid (DIALYVITE PO) Take 1 tablet by mouth.   Yes Historical Provider, MD  calcium acetate (PHOSLO) 667 MG capsule Take 2 capsules (1,334 mg total) by mouth 3 (three) times daily with meals. Take  with snacks Patient taking differently: Take 1,334-2,001 mg by mouth See admin instructions. Take 3 capsules (2001 mg) with meals and 2 capsules (1334 mg) with snacks 05/01/13  Yes [name], MD  carvedilol (COREG) 6.25 MG tablet Take 1 tablet (6.25 mg  total) by mouth 2 (two) times daily. 01/29/15  Yes Joseph Art, DO  famotidine (PEPCID) 20 MG tablet Take 20 mg by mouth daily as needed for heartburn.  03/24/13  Yes Elease Etienne, MD  isosorbide dinitrate (ISORDIL) 10 MG tablet Take 1 tablet (10 mg total) by mouth 3 (three) times daily. 02/25/15  Yes Zannie Cove, MD  lisinopril (PRINIVIL,ZESTRIL) 2.5 MG tablet Take 1 tablet (2.5 mg total) by mouth 2 (two) times daily. Patient taking differently: Take 2.5 mg by mouth daily.  01/29/15 01/29/16 Yes Jessica U Vann, DO  LORazepam (ATIVAN) 1 MG tablet Take 1 tablet (1 mg  total) by mouth See admin instructions. On dialysis days, before dialysis Patient taking differently: Take 1 mg by mouth 3 (three) times a week. Take 1 tablet (1 mg) prior to dialysis on Tuesday, Thursday and Saturday 01/29/15  Yes Jessica U Vann, DO  sertraline (ZOLOFT) 100 MG tablet Take 100 mg by mouth daily.   Yes Historical Provider, MD  oxyCODONE-acetaminophen (PERCOCET/ROXICET) 5-325 MG per tablet Take 1 tablet by mouth every 8 (eight) hours as needed for severe pain. Patient not taking: Reported on 03/03/2015 02/25/15   Zannie Cove, MD   Current Facility-Administered Medications  Medication Dose Route Frequency Provider Last Rate Last Dose  . acetaminophen (TYLENOL) tablet 650 mg  650 mg Oral Q6H PRN Rolly Salter, MD       Or  . acetaminophen (TYLENOL) suppository 650 mg  650 mg Rectal Q6H PRN Rolly Salter, MD      . calcium acetate (PHOSLO) capsule 1,334 mg  1,334 mg Oral BID BM PRN Rolly Salter, MD      . calcium acetate (PHOSLO) capsule 2,001 mg  2,001 mg Oral TID WC Rolly Salter, MD   2,001 mg at 03/03/15 4540  . carvedilol (COREG) tablet 6.25 mg  6.25 mg Oral BID WC Rolly Salter, MD   6.25 mg at 03/03/15 1111  . diphenhydrAMINE (BENADRYL) capsule 25 mg  25 mg Oral BID Rolly Salter, MD   25 mg at 03/03/15 1111  . famotidine (PEPCID) tablet 20 mg  20 mg Oral Daily Rolly Salter, MD   20 mg at 03/03/15 1110  . heparin injection 5,000 Units  5,000 Units Subcutaneous 3 times per day Rolly Salter, MD   5,000 Units at 03/03/15 0600  . isosorbide dinitrate (ISORDIL) tablet 10 mg  10 mg Oral TID Rolly Salter, MD   10 mg at 03/03/15 1110  . [START ON 03/04/2015] levofloxacin (LEVAQUIN) IVPB 500 mg  500 mg Intravenous Q48H Kimberly B Hammons, RPH      . [START ON 03/04/2015] LORazepam (ATIVAN) tablet 1 mg  1 mg Oral Once per day on Tue Thu Sat Rolly Salter, MD      . ondansetron [facility]) tablet 4 mg  4 mg Oral Q6H PRN Rolly Salter, MD       Or  . ondansetron [facility])  injection 4 mg  4 mg Intravenous Q6H PRN Rolly Salter, MD      . oxyCODONE-acetaminophen (PERCOCET/ROXICET) 5-325 MG per tablet 1-2 tablet  1-2 tablet Oral Q6H PRN Rolly Salter, MD   2 tablet at 03/03/15 [phone]  . sertraline (ZOLOFT) tablet 100 mg  100 mg Oral Daily Rolly Salter, MD   100 mg at 03/03/15 1110  . sodium chloride 0.9 % injection 3 mL  3 mL Intravenous Q12H Rolly Salter, MD  3 mL at 03/03/15 1111   Labs: Basic Metabolic Panel:  Recent Labs Lab 02/25/15 0732 03/02/15 1800 03/03/15 0700  NA 135 137 135  K 4.2 5.2* 5.4*  CL 97* 98* 100*  CO2 25 20* 18*  GLUCOSE 87 88 88  BUN 51* 92* 100*  CREATININE 7.80* 11.78* 12.40*  CALCIUM 9.7 10.2 9.7  PHOS  --  7.6* 8.2*   Liver Function Tests:  Recent Labs Lab 03/02/15 1800 03/03/15 0700  AST 22  --   ALT 13*  --   ALKPHOS 268*  --   BILITOT 0.6  --   PROT 7.8  --   ALBUMIN 3.7 2.9*    Recent Labs Lab 03/02/15 1800  LIPASE 41   No results for input(s): AMMONIA in the last 168 hours. CBC:  Recent Labs Lab 02/25/15 0732 03/02/15 1800 03/03/15 0700  WBC 7.7 7.6 7.1  NEUTROABS  --   --  3.9  HGB 9.9* 10.6* 9.4*  HCT 31.7* 32.4* 29.2*  MCV 92.2 90.3 89.8  PLT 149* 148* 144*   Cardiac Enzymes:  Recent Labs Lab 03/02/15 2303 03/03/15 0354 03/03/15 1025  TROPONINI 0.15* 0.16* 0.14*   CBG: No results for input(s): GLUCAP in the last 168 hours. Iron Studies: No results for input(s): IRON, TIBC, TRANSFERRIN, FERRITIN in the last 72 hours. Studies/Results: Dg Chest 2 View  03/02/2015   CLINICAL DATA:  Chronic renal failure. Cardiac arrhythmia. Generalized body aching  EXAM: CHEST  2 VIEW  COMPARISON:  None.  FINDINGS: There is a focal area of infiltrate in the right lower lobe. Lungs elsewhere clear. Heart is enlarged with pulmonary vascularity within normal limits. Pacemaker lead is attached to the right ventricle. No adenopathy. No bone lesions. There is a filter in the inferior vena cava.   IMPRESSION: Infiltrate right lower lobe. Cardiomegaly. Pacemaker as described. No pneumothorax.   Electronically Signed   By: Bretta Bang III M.D.   On: 03/02/2015 19:16     Review of Systems: Gen: Denies any fever, chills, sweats, anorexia, fatigue, weakness, malaise, weight loss, and sleep disorder HEENT: No visual complaints, No history of Retinopathy. Normal external appearance No Epistaxis or Sore throat. No sinusitis.   CV: reports chronic chest pain- stable/no change. Reports mild LE edema x 3 days.  Resp: Denies dyspnea at rest, reports mild dyspnea with exercise. Denies cough, sputum, wheezing, coughing up blood, and pleurisy. GI: Denies vomiting blood, jaundice, and fecal incontinence.   Denies dysphagia or odynophagia. GU : Anuric MS: Reports R clavicle pain- 'bone disease' Derm: Reports itching from 'high phosphorus'   Psych: Denies depression, anxiety, memory loss, suicidal ideation, hallucinations, paranoia, and confusion. Heme: Denies bruising, bleeding, and enlarged lymph nodes. Neuro: No headache.  No diplopia. No dysarthria.  No dysphasia.  No history of CVA.  No Seizures. No paresthesias.  No weakness. Endocrine No DM.  No Thyroid disease.  No Adrenal disease.  Physical Exam: Filed Vitals:   03/02/15 2020 03/02/15 2122 03/03/15 0455 03/03/15 1043  BP: 144/108 137/97 108/70 121/84  Pulse: 103 94 86 80  Temp: 98.4 F (36.9 C) 98 F (36.7 C) 97.9 F (36.6 C) 97.7 F (36.5 C)  TempSrc:  Oral Oral Oral  Resp: 22 20 19 17   Height:  5\' 3"  (1.6 m)    Weight:  55 kg (121 lb 4.1 oz)    SpO2: 100% 100% 100% 100%     General: Well developed, well nourished, in no acute distress. Walking around room  Head: Normocephalic, atraumatic, sclera non-icteric, mucus membranes are moist. Mild facial edema. Neck: Supple. JVD not elevated. Lungs: Clear bilaterally to auscultation without wheezes, rales, or rhonchi. Breathing is unlabored. Heart: RRR with S1 S2. No murmurs, rubs,  or gallops appreciated. Abdomen: Soft, non-tender, non-distended with normoactive bowel sounds. No rebound/guarding. No obvious abdominal masses. M-S:  Strength and tone appear normal for age. Lower extremities: trace LE edema Neuro: Alert and oriented X 3. Moves all extremities spontaneously. Psych:  Responds to questions appropriately with a normal affect. Dialysis Access: LUA AVF +b/t  Dialysis Orders:  edw 53kgs L AVF +b/t  Assessment/Plan: 1. HCAP- infiltrate on xray- per primary/ blood cultures pending. Levaquin per pharm. Afebrile- probably just volume 2.  ESRD -  [place]- no HD since DC on 5/5. HD pending today. K+ 5.4. Will see if can reconsider a trial here ? 3.  Hypertension/volume  - 121/84 coreg. edw 53kgs. Need standing wts 4.  Anemia  - hgb 9.4/ tsat 19 on 5/2- start ESA and Fe today.  5.  Metabolic bone disease -  phos 8.2 cont home phoslo- no recent PTH-  but is on sensipar- unsure dose-  start 30mg s and check PTH today 6.  Nutrition - alb 2.9 renal diet. Vitamin 7. Chronic chest pain- PE ruled out 5/1. Chronically elevated troponin 8. noncompliance  Jetty Duhamel, NP [facility] [phone] 03/03/2015, 12:24 PM   Patient seen and examined, agree with above note with above modifications. 24 year old BF known history of noncompliance with HD- now true social issue is tearful- has no place to stay in [place]- HD today. Really this only became an issue since April but unless we have a plan for her I think this will keep happening.    Annie Sable, MD 03/03/2015

## 2015-03-03 NOTE — Progress Notes (Addendum)
ANTIBIOTIC CONSULT NOTE - INITIAL  Pharmacy Consult for vancomycin and Zosyn Indication: pneumonia  Allergies  Allergen Reactions  . Tramadol Anaphylaxis  . Iohexol Itching  . Morphine And Related Itching    Ok with oxycodone  . Vicodin [Hydrocodone-Acetaminophen] Hives  . Zosyn [Piperacillin Sod-Tazobactam So] Hives    Pt has received zosyn, vancomycin, benadryl and dilaudid, developed hives, has gotten other medications in the past multiple times so assuming zosyn as the cause.    Patient Measurements: Height: 5\' 3"  (160 cm) Weight: 121 lb 4.1 oz (55 kg) IBW/kg (Calculated) : 52.4  Vital Signs: Temp: 97.7 F (36.5 C) (05/11 1043) Temp Source: Oral (05/11 1043) BP: 121/84 mmHg (05/11 1043) Pulse Rate: 80 (05/11 1043) Intake/Output from previous day: 05/10 0701 - 05/11 0700 In: 450 [P.O.:200; IV Piggyback:250] Out: 0  Intake/Output from this shift:    Labs:  Recent Labs  03/02/15 1800 03/03/15 0700  WBC 7.6 7.1  HGB 10.6* 9.4*  PLT 148* 144*  CREATININE 11.78* 12.40*   Estimated Creatinine Clearance: 5.8 mL/min (by C-G formula based on Cr of 12.4).  Microbiology: No results found for this or any previous visit (from the past 720 hour(s)).   Assessment: 24 yo F presented to ED 03/03/2015 with 3d hx of nausea/vomiting.  Pt has missed her last two dialysis sessions.  CXR shows HCAP. She was given Vancomycin, Diluadid, Benadryl and Zosyn in the ED and developed hives- it was assumed this was from Zosyn as she had received all the other medications before, and Zosyn was added to her allergy list. Spoke with Dr. Randol Kern today about re-challenging her with Zosyn- we agreed it would be wise to do so since a penicillin allergy can make treatment of infections difficult. Agreed to add an extra dose of scheduled Benadryl so she gets 25mg  PO prior to each dose of Zosyn.  Noted patient has received cefazolin and ceftriaxone in the past with no reaction. She does not have  any penicillin-type allergic reaction in her records from [facility] or [place] per [facility], and it appears she has been prescribed Augmentin in the [facility] system before.  Goal of Therapy:  Pre-HD vancomycin level 15-62mcg/mL   Plan:  -Zosyn 2.25g IV q8h with Benadryl 25mg  po given prior to each dose. She just went to HD ~30 minutes ago, so will have the next dose start after today's HD session  -Vancomycin 500mg  IV qHD- ordered 1 dose for after today's HD session. Will follow schedule moving forward and time appropriately -spoke with RN Camieko about plan as written above- will watch for signs of reaction and alert pharmacy and provider if reaction occurs   Eldrige Pitkin D. Darnita Woodrum, PharmD, BCPS Clinical Pharmacist Pager: [phone] 03/03/2015 2:46 PM

## 2015-03-03 NOTE — Procedures (Signed)
CRITICAL VALUE ALERT  Critical value received: 1550   Date of notification:  03/03/15  Time of notification:  1552  Critical value read back:Yes.    Nurse who received alert:  Haynes Dage  MD notified (1st page):  Dr. Jonna Coup  Time of first page:  1553  MD notified (2nd page):  Time of second page:  Responding MD:  Dr. Kathrene Bongo  Time MD responded:  [phone]

## 2015-03-03 NOTE — Progress Notes (Signed)
Patient Demographics  [name] Small, is a 24 y.o. female, DOB - 1991-02-22, GNF:[id number]  Admit date - 03/02/2015   Admitting Physician Rolly Salter, MD  Outpatient Primary MD for the patient is Ellin Saba  LOS - 1   Chief Complaint  Patient presents with  . Pain       Admission HPI/Brief narrative:  [name] Small is a 24 y.o. female with a Past Medical History of ESRD on hemodialysis, chronic systolic heart failure status post AICD , history of pulmonary embolism 2 years back-no longer on anticoagulation who presents today with complaints of"bone pain", recently discharged on 5/5 , reports she never had hemodialysis since then, reports nausea and vomiting, chest x-ray showing right lung opacity, admitted for volume overload treatment, and possible pneumonia.  Subjective:   [name] Small today has, No headache, No chest pain, No abdominal pain - No Nausea, No new weakness tingling or numbness, denies cough, no shortness of breath.  Assessment & Plan    Principal Problem:   HCAP (healthcare-associated pneumonia) Active Problems:   ESRD (end stage renal disease)   HTN (hypertension)   Pleuritic chest pain   Nausea with vomiting   Ascites   Hyperkalemia   Chronically Elevated troponin   Uremia   Hives  Acute on chronic systolic heart failure with Volume overload in a setting of missed dialysis - Most recent EF in February 2016 around 10% - Nephrology service were consulted, patient will go for hemodialysis today.  HCAP - patient  had nausea and vomiting secondary to uremia from missing hemodialysis, right lower lobe opacity or infiltrate, initially on vancomycin and Zosyn(Zosyn changed to levofloxacin given questionable penicillin allergy). - We'll recheck chest x-ray after hemodialysis to evaluate if infiltrate secondary to volume overload versus opacity. - Discussed with  pharmacy regarding penicillin, patient tolerated cephalosporins in the past, had multiple medication given including Dilaudid, Benadryl, vancomycin, Zosyn for which she developed rash, unclear if related to zosyn or not, will resume her on Zosyn.  Pleuritic chest pain - Resolved, no recurrence, chronically elevated troponins at baseline, recent CT chest and she will 5/1 negative for PE  End-stage renal disease - Nephrology consulted, will be resumed on hemodialysis  Anemia - Anemia of end-stage renal disease, started on ASA and [place] by nephrology  Code Status: Full  Family Communication: none at bedside  Disposition Plan: pending further work up   Procedures  none   Consults   Nephrology   Medications  Scheduled Meds: . calcium acetate  2,001 mg Oral TID WC  . carvedilol  6.25 mg Oral BID WC  . cinacalcet  30 mg Oral Q supper  . [START ON 03/10/2015] darbepoetin (ARANESP) injection - DIALYSIS  40 mcg Intravenous Q Wed-HD  . diphenhydrAMINE  25 mg Oral BID  . famotidine  20 mg Oral Daily  . [START ON 03/05/2015] ferric gluconate (FERRLECIT/NULECIT) IV  125 mg Intravenous Q M,W,F-HD  . heparin  5,000 Units Subcutaneous 3 times per day  . isosorbide dinitrate  10 mg Oral TID  . [START ON 03/04/2015] levofloxacin (LEVAQUIN) IV  500 mg Intravenous Q48H  . [START ON 03/04/2015] LORazepam  1 mg Oral Once per day on Tue Thu Sat  . multivitamin  1 tablet Oral QHS  . sertraline  100 mg Oral Daily  . sodium chloride  3 mL Intravenous Q12H   Continuous Infusions:  PRN Meds:.acetaminophen **OR** acetaminophen, calcium acetate, ondansetron **OR** ondansetron (ZOFRAN) IV, oxyCODONE-acetaminophen  DVT Prophylaxis   Heparin -  Lab Results  Component Value Date   PLT 144* 03/03/2015    Antibiotics    Anti-infectives    Start     Dose/Rate Route Frequency Ordered Stop   03/04/15 2200  levofloxacin (LEVAQUIN) IVPB 500 mg     500 mg 100 mL/hr over 60 Minutes Intravenous Every 48  hours 03/02/15 2228     03/02/15 2230  levofloxacin (LEVAQUIN) IVPB 750 mg     750 mg 100 mL/hr over 90 Minutes Intravenous  Once 03/02/15 2228 03/03/15 0145   03/02/15 1930  vancomycin (VANCOCIN) IVPB 1000 mg/200 mL premix     1,000 mg 200 mL/hr over 60 Minutes Intravenous  Once 03/02/15 1922 03/02/15 2124   03/02/15 1930  piperacillin-tazobactam (ZOSYN) IVPB 3.375 g     3.375 g 100 mL/hr over 30 Minutes Intravenous  Once 03/02/15 1922 03/02/15 2054          Objective:   Filed Vitals:   03/02/15 2020 03/02/15 2122 03/03/15 0455 03/03/15 1043  BP: 144/108 137/97 108/70 121/84  Pulse: 103 94 86 80  Temp: 98.4 F (36.9 C) 98 F (36.7 C) 97.9 F (36.6 C) 97.7 F (36.5 C)  TempSrc:  Oral Oral Oral  Resp: Height:   (1.6 m)    Weight:  55 kg (121 lb 4.1 oz)    SpO2: 100% 100% 100% 100%    Wt Readings from Last 3 Encounters:  03/02/15 55 kg (121 lb 4.1 oz)  02/25/15 55 kg (121 lb 4.1 oz)  02/12/15 61.689 kg (136 lb)     Intake/Output Summary (Last 24 hours) at 03/03/15 1436 Last data filed at 03/03/15 0600  Gross per 24 hour  Intake    450 ml  Output      0 ml  Net    450 ml     Physical Exam  Awake Alert, Oriented X 3, No new F.N deficits, Normal affect [place].AT,PERRAL Supple Neck,No JVD, No cervical lymphadenopathy appriciated.  Symmetrical Chest wall movement, Good air movement bilaterally,  RRR,No Gallops,Rubs or new Murmurs, No Parasternal Heave +ve B.Sounds, Abd Soft, No tenderness, No organomegaly appriciated, No rebound - guarding or rigidity. No Cyanosis, Clubbing or edema, No new Rash or bruise     Data Review   Micro Results No results found for this or any previous visit (from the past 240 hour(s)).  Radiology Reports Dg Chest 2 View  03/02/2015   CLINICAL DATA:  Chronic renal failure. Cardiac arrhythmia. Generalized body aching  EXAM: CHEST  2 VIEW  COMPARISON:  None.  FINDINGS: There is a focal area of infiltrate in the right  lower lobe. Lungs elsewhere clear. Heart is enlarged with pulmonary vascularity within normal limits. Pacemaker lead is attached to the right ventricle. No adenopathy. No bone lesions. There is a filter in the inferior vena cava.  IMPRESSION: Infiltrate right lower lobe. Cardiomegaly. Pacemaker as described. No pneumothorax.   Electronically Signed   By: Bretta Bang III M.D.   On: 03/02/2015 19:16   Dg Chest 2 View  02/12/2015   CLINICAL DATA:  Two day history of right-sided chest pain at the site of her pacemaker generator.  EXAM: CHEST  2 VIEW  COMPARISON:  02/03/2017 and earlier.  FINDINGS: Cardiac silhouette markedly enlarged but stable. Hilar and mediastinal contours otherwise unremarkable. Right subclavian single lead transvenous pacemaker unchanged and appears intact. No soft tissue swelling over the generator pack on the lateral image. Linear scarring in the left lower lobe, unchanged dating back to the November, 2015. Lungs otherwise clear. Bronchovascular markings normal. Pulmonary vascularity normal. No visible pleural effusions. No pneumothorax. Visualized bony thorax intact. IVC filter noted.  IMPRESSION: Stable marked cardiomegaly. Stable linear scarring in the left lower lobe. No acute cardiopulmonary disease.   Electronically Signed   By: Hulan Saas M.D.   On: 02/12/2015 21:43   Dg Chest 2 View  02/04/2015   CLINICAL DATA:  Left chest pain with shortness of breath for 1 day. History of dialysis and pacemaker placement 1 month ago. Former smoker with history of pulmonary embolism. Initial encounter.  EXAM: CHEST  2 VIEW  COMPARISON:  Radiographs 01/25/2015.  CT 12/12/2014.  FINDINGS: Right subclavian pacemaker lead appears unchanged at the right ventricular apex. There is stable moderate cardiomegaly and vascular congestion. There is no edema, confluent airspace opacity or significant pleural effusion. The bones appear unchanged with bilateral distal clavicular osteolysis.   IMPRESSION: Stable chest with cardiomegaly and chronic vascular congestion. No acute findings.   Electronically Signed   By: Carey Bullocks M.D.   On: 02/04/2015 17:42   Ct Angio Chest Pe W/cm &/or Wo Cm  02/21/2015   CLINICAL DATA:  Chest pain abdominal pain for 2 days. Hemodialysis patient.  EXAM: CT ANGIOGRAPHY CHEST WITH CONTRAST  TECHNIQUE: Multidetector CT imaging of the chest was performed using the standard protocol during bolus administration of intravenous contrast. Multiplanar CT image reconstructions and MIPs were obtained to evaluate the vascular anatomy.  CONTRAST:  57mL OMNIPAQUE IOHEXOL 350 MG/ML SOLN  COMPARISON:  CT thorax 12/20/2014  FINDINGS: Mediastinum/Nodes: No filling defects within the pulmonary suggest acute pulmonary embolism. Acute findings aorta great vessels. The heart is enlarged. No pericardial fluid.  Lungs/Pleura: No evidence of pulmonary edema. There is trace right pleural effusion. No infiltrate or pneumothorax.  Upper abdomen: There is small amount of intra-abdominal free fluid. Kidneys are atrophic with multiple mixed density lesions.  Musculoskeletal: No acute findings of skeleton. There is a pacemaker in the right chest wall.  Review of the MIP images confirms the above findings.  IMPRESSION: 1. No evidence of pulmonary embolism. 2. Cardiomegaly without evidence of pulmonary edema. 3. Small right effusion. 4. Free fluid in the abdomen may relate to heart failure or end-stage renal disease. 5. Nodular kidneys unchanged from prior.   Electronically Signed   By: Genevive Bi M.D.   On: 02/21/2015 20:35   US Abdomen Complete  02/24/2015   CLINICAL DATA:  Abdominal distention  EXAM: ULTRASOUND ABDOMEN COMPLETE  COMPARISON:  01/14/2013  FINDINGS: Gallbladder: No gallstones or wall thickening visualized. No sonographic Murphy sign noted.  Common bile duct: Diameter: 3 mm  Liver: No focal lesion identified. Within normal limits in parenchymal echogenicity.  IVC: No  abnormality visualized.  Pancreas: Visualized portion unremarkable.  Spleen: Size and appearance within normal limits.  Right Kidney: Length: 8.5 cm in length. There is increased echogenicity throughout the renal cortex. Multiple cysts are again noted.  Left Kidney: Length: 9.1 cm in length. Increased cortical echogenicity. Multiple cysts are present throughout the left kidney.  Abdominal aorta: No aneurysm visualized.  Other findings: Small amount of ascites is present.  IMPRESSION: Small amount of ascites.  Chronic changes of  the kidneys are not significantly changed.   Electronically Signed   By: Jolaine Click M.D.   On: 02/24/2015 08:39     CBC  Recent Labs Lab 02/25/15 0732 03/02/15 1800 03/03/15 0700  WBC 7.7 7.6 7.1  HGB 9.9* 10.6* 9.4*  HCT 31.7* 32.4* 29.2*  PLT 149* 148* 144*  MCV 92.2 90.3 89.8  MCH 28.8 29.5 28.9  MCHC 31.2 32.7 32.2  RDW 16.8* 17.6* 17.9*  LYMPHSABS  --   --  2.5  MONOABS  --   --  0.6  EOSABS  --   --  0.1  BASOSABS  --   --  0.0    Chemistries   Recent Labs Lab 02/25/15 0732 03/02/15 1800 03/03/15 0700  NA 135 137 135  K 4.2 5.2* 5.4*  CL 97* 98* 100*  CO2 25 20* 18*  GLUCOSE 87 88 88  BUN 51* 92* 100*  CREATININE 7.80* 11.78* 12.40*  CALCIUM 9.7 10.2 9.7  MG  --  2.3  --   AST  --  22  --   ALT  --  13*  --   ALKPHOS  --  268*  --   BILITOT  --  0.6  --    ------------------------------------------------------------------------------------------------------------------ estimated creatinine clearance is 5.8 mL/min (by C-G formula based on Cr of 12.4). ------------------------------------------------------------------------------------------------------------------ No results for input(s): HGBA1C in the last 72 hours. ------------------------------------------------------------------------------------------------------------------ No results for input(s): CHOL, HDL, LDLCALC, TRIG, CHOLHDL, LDLDIRECT in the last 72  hours. ------------------------------------------------------------------------------------------------------------------ No results for input(s): TSH, T4TOTAL, T3FREE, THYROIDAB in the last 72 hours.  Invalid input(s): FREET3 ------------------------------------------------------------------------------------------------------------------ No results for input(s): VITAMINB12, FOLATE, FERRITIN, TIBC, IRON, RETICCTPCT in the last 72 hours.  Coagulation profile No results for input(s): INR, PROTIME in the last 168 hours.  No results for input(s): DDIMER in the last 72 hours.  Cardiac Enzymes  Recent Labs Lab 03/02/15 2303 03/03/15 0354 03/03/15 1025  TROPONINI 0.15* 0.16* 0.14*   ------------------------------------------------------------------------------------------------------------------ Invalid input(s): POCBNP     Time Spent in minutes   30 minutes   [name] Small M.D on 03/03/2015 at 2:36 PM  Between 7am to 7pm - Pager - [phone]  After 7pm go to [email or web address] - password [facility]  [facility]   Office  [phone]

## 2015-03-03 NOTE — Progress Notes (Signed)
New Admission Note:  Arrival Method: Via stretcher with [facility], IV tube running Vancomycin Mental Orientation: Alert and orientedx4 Telemetry: placed on Tele, CCMD notified Assessment: Completed Skin: Rash (hives) on right arm, benadryl given IV: Right wrist Pain: Denies any pain Tubes: n/a Safety Measures: Safety Fall Prevention Plan was given, discussed and signed. Admission: initiated [address] [place] Orientation: Patient has been orientated to the room, unit and the staff. Family: None at bedside  Orders have been reviewed and implemented. Will continue to monitor the patient. Call light has been placed within reach and bed alarm has been activated.   Tempie Donning BSN, RN  Phone Number: [phone] [facility] [address] [place] Med/Surg-Renal Unit

## 2015-03-03 NOTE — Procedures (Signed)
Patient was seen on dialysis and the procedure was supervised.  BFR 400  Via AVF BP is  124/85.   Patient appears to be tolerating treatment well  [name] Small A 03/03/2015

## 2015-03-04 ENCOUNTER — Other Ambulatory Visit (HOSPITAL_COMMUNITY): Payer: Self-pay | Admitting: Pharmacy Technician

## 2015-03-04 LAB — PARATHYROID HORMONE, INTACT (NO CA): PTH: 1513 pg/mL — AB (ref 15–65)

## 2015-03-04 LAB — MRSA PCR SCREENING: MRSA by PCR: NEGATIVE

## 2015-03-04 MED ORDER — CINACALCET HCL 30 MG PO TABS: 30.0000 mg | ORAL_TABLET | Freq: Every day | ORAL | Status: DC

## 2015-03-04 MED ORDER — LISINOPRIL 2.5 MG PO TABS: 2.5000 mg | ORAL_TABLET | Freq: Two times a day (BID) | ORAL | Status: DC

## 2015-03-04 MED ORDER — CALCIUM ACETATE 667 MG PO CAPS: 1334.0000 mg | ORAL_CAPSULE | Freq: Three times a day (TID) | ORAL | Status: DC

## 2015-03-04 MED ORDER — FAMOTIDINE 20 MG PO TABS: 20.0000 mg | ORAL_TABLET | Freq: Every day | ORAL | Status: DC | PRN

## 2015-03-04 MED ORDER — CARVEDILOL 6.25 MG PO TABS: 6.2500 mg | ORAL_TABLET | Freq: Two times a day (BID) | ORAL | Status: DC

## 2015-03-04 MED ORDER — SERTRALINE HCL 100 MG PO TABS: 100.0000 mg | ORAL_TABLET | Freq: Every day | ORAL | Status: DC

## 2015-03-04 MED ORDER — ISOSORBIDE DINITRATE 10 MG PO TABS: 10.0000 mg | ORAL_TABLET | Freq: Three times a day (TID) | ORAL | Status: DC

## 2015-03-04 NOTE — Discharge Summary (Signed)
[name] Small, is a 24 y.o. female  DOB Jun 10, 1991  MRN [id number].  Admission date:  03/02/2015  Admitting Physician  Rolly Salter, MD  Discharge Date:  03/04/2015   Primary MD  Ellin Saba  Recommendations for primary care physician for things to follow:  - Please follow labs including CBC, BMP during next visit. - Please ensure patient compliance with hemodialysis   Admission Diagnosis  Vomiting [R11.10] Uremia [N19] HCAP (healthcare-associated pneumonia) [J18.9]   Discharge Diagnosis  Vomiting [R11.10] Uremia [N19] HCAP (healthcare-associated pneumonia) [J18.9]    Principal Problem:   HCAP (healthcare-associated pneumonia) Active Problems:   ESRD (end stage renal disease)   HTN (hypertension)   Pleuritic chest pain   Nausea with vomiting   Ascites   Hyperkalemia   Chronically Elevated troponin   Uremia   Hives      Past Medical History  Diagnosis Date  . Hemodialysis patient   . Hypertension   . Pulmonary emboli 01/2012    Bilateral, moderate clot burden, areas of pulmonary infarction and central necrosis  . CHF (congestive heart failure)   . Cardiomyopathy   . Dysrhythmia     at times per pt.  . Anemia   . H/O transfusion of packed red blood cells   . 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 Polycystic kidney disease   . Cellulitis and abscess of face 03/22/2013  . Renal insufficiency   . Sickle cell anemia     Past Surgical History  Procedure Laterality Date  . Nephrectomy    . Av fistula placement    . Kidney transplant  2008    failed  . Tonsillectomy      as a child.  . Adenoidectomy    . Incision and drainage abscess Right 03/21/2013    Procedure: INCISION AND DRAINAGE RIGHT CHEEK ABSCESS REMOVAL OF FOREIGN BODY;  Surgeon: Serena Colonel, MD;  Location: [facility] OR;   Service: ENT;  Laterality: Right;  . Implantable cardioverter defibrillator implant Right 12/2014       History of present illness and  Hospital Course:     Kindly see H&P for history of present illness and admission details, please review complete Labs, Consult reports and Test reports for all details in brief  HPI  from the history and physical done on the day of admission [name] Small is a 24 y.o. female with Past medical history of ESRD on hemodialysis noncompliant, history of CHF with systolic dysfunction, essential hypertension, chronic anemia secondary to ESRD. The patient is presenting with complaints of generalized body ache and chest pain. She mentions she has missed 2 sessions of hemodialysis. She mentions that the chest pain started yesterday and has been a constant pain. This like a sharp stabbing pain. Located on the right side. Also since last 3 days she has been having nausea with vomiting. At present she does not have any further nausea. Does not have any  abdominal pain does not have any constipation or diarrhea. Denies any swelling of her legs and orthopnea PND. She mentions she is compliant with all her medications. She is requesting Dilaudid.   Hospital Course  [name] Small is a 24 y.o. female with a Past Medical History of ESRD on hemodialysis, chronic systolic heart failure status post AICD , history of pulmonary embolism 2 years back-no longer on anticoagulation who presents today with complaints of"bone pain", recently discharged on 5/5 , reports she never had hemodialysis since then, reports nausea and vomiting, chest x-ray showing right lung opacity, admitted for volume overload treatment, and possible pneumonia  Acute on chronic systolic heart failure with Volume overload in a setting of missed dialysis - Most recent EF in February 2016 around 10% - Nephrology service were consulted, patient went hemodialysis on 5/11, 5/12.  Questionable pneumonia -  unlikely. - initial x-ray showing right lower lobe infiltrate , suspected for aspiration pneumonia especially with patient complains of nausea and vomiting secondary to uremia , but patient was a febrile, with no leukocytosis, no cough, so these findings were thought to be most likely secondary to volume overload , chest x-ray was repeated  evidence of infiltrate or pneumonia , so antibiotics were stopped .  Pleuritic chest pain - Resolved, no recurrence, chronically elevated troponins at baseline, recent CT chest and she will 5/1 negative for PE  End-stage renal disease - Nephrology consulted, received hemodialysis 5/11, 5/12, patient reports she has had hemodialysis arranged at [facility] this coming Saturday. reports she is going to [place] for next week. - continue PhosLo, given prescription for Sensipar .  Anemia - Anemia of end-stage renal disease,  patient received IV iron and aranesp by nephrology   Discharge Condition: Stable         Discharge Instructions  and  Discharge Medications     Discharge Instructions    Discharge instructions    Complete by:  As directed   Follow with Primary MD Gwendolyn Fill G in 7 days   Get CBC, CMP, 2 view Chest X ray checked  by Primary MD next visit.    Activity: As tolerated with Full fall precautions use walker/cane & assistance as needed   Disposition Home    Diet: Renal diet, with feeding assistance and aspiration precautions.  For Heart failure patients - Check your Weight same time everyday, if you gain over 2 pounds, or you develop in leg swelling, experience more shortness of breath or chest pain, call your Primary MD immediately. Follow Cardiac Low Salt Diet and 1.5 lit/day fluid restriction.   On your next visit with your primary care physician please Get Medicines reviewed and adjusted.   Please request your Prim.MD to go over all Hospital Tests and Procedure/Radiological results at the follow up, please get all  Hospital records sent to your Prim MD by signing hospital release before you go home.   If you experience worsening of your admission symptoms, develop shortness of breath, life threatening emergency, suicidal or homicidal thoughts you must seek medical attention immediately by calling 911 or calling your MD immediately  if symptoms less severe.  You Must read complete instructions/literature along with all the possible adverse reactions/side effects for all the Medicines you take and that have been prescribed to you. Take any new Medicines after you have completely understood and accpet all the possible adverse reactions/side effects.   Do not drive, operating heavy machinery, perform activities at heights, swimming or participation in water activities or provide  baby sitting services if your were admitted for syncope or siezures until you have seen by Primary MD or a Neurologist and advised to do so again.  Do not drive when taking Pain medications.    Do not take more than prescribed Pain, Sleep and Anxiety Medications  Special Instructions: If you have smoked or chewed Tobacco  in the last 2 yrs please stop smoking, stop any regular Alcohol  and or any Recreational drug use.  Wear Seat belts while driving.   Please note  You were cared for by a hospitalist during your hospital stay. If you have any questions about your discharge medications or the care you received while you were in the hospital after you are discharged, you can call the unit and asked to speak with the hospitalist on call if the hospitalist that took care of you is not available. Once you are discharged, your primary care physician will handle any further medical issues. Please note that NO REFILLS for any discharge medications will be authorized once you are discharged, as it is imperative that you return to your primary care physician (or establish a relationship with a primary care physician if you do not have one) for  your aftercare needs so that they can reassess your need for medications and monitor your lab values.     Increase activity slowly    Complete by:  As directed             Medication List    TAKE these medications        calcium acetate 667 MG capsule  Commonly known as:  PHOSLO  Take 2 capsules (1,334 mg total) by mouth 3 (three) times daily with meals. Take 667mg  with snacks     carvedilol 6.25 MG tablet  Commonly known as:  COREG  Take 1 tablet (6.25 mg total) by mouth 2 (two) times daily.     cinacalcet 30 MG tablet  Commonly known as:  SENSIPAR  Take 1 tablet (30 mg total) by mouth daily with supper.     DIALYVITE PO  Take 1 tablet by mouth.     famotidine 20 MG tablet  Commonly known as:  PEPCID  Take 20 mg by mouth daily as needed for heartburn.     isosorbide dinitrate 10 MG tablet  Commonly known as:  ISORDIL  Take 1 tablet (10 mg total) by mouth 3 (three) times daily.     lisinopril 2.5 MG tablet  Commonly known as:  PRINIVIL,ZESTRIL  Take 1 tablet (2.5 mg total) by mouth 2 (two) times daily.     LORazepam 1 MG tablet  Commonly known as:  ATIVAN  Take 1 tablet (1 mg total) by mouth See admin instructions. On dialysis days, before dialysis     oxyCODONE-acetaminophen 5-325 MG per tablet  Commonly known as:  PERCOCET/ROXICET  Take 1 tablet by mouth every 8 (eight) hours as needed for severe pain.     sertraline 100 MG tablet  Commonly known as:  ZOLOFT  Take 100 mg by mouth daily.          Diet and Activity recommendation: See Discharge Instructions above   Consults obtained - Nephrology    Major procedures and Radiology Reports - PLEASE review detailed and final reports for all details, in brief -    hemodialysis 5/11, 5/12    Dg Chest 2 View  03/02/2015   CLINICAL DATA:  Chronic renal failure. Cardiac arrhythmia. Generalized body aching  EXAM: CHEST  2 VIEW  COMPARISON:  None.  FINDINGS: There is a focal area of infiltrate in the right  lower lobe. Lungs elsewhere clear. Heart is enlarged with pulmonary vascularity within normal limits. Pacemaker lead is attached to the right ventricle. No adenopathy. No bone lesions. There is a filter in the inferior vena cava.  IMPRESSION: Infiltrate right lower lobe. Cardiomegaly. Pacemaker as described. No pneumothorax.   Electronically Signed   By: Bretta Bang III M.D.   On: 03/02/2015 19:16   Dg Chest 2 View  02/12/2015   CLINICAL DATA:  Two day history of right-sided chest pain at the site of her pacemaker generator.  EXAM: CHEST  2 VIEW  COMPARISON:  02/03/2017 and earlier.  FINDINGS: Cardiac silhouette markedly enlarged but stable. Hilar and mediastinal contours otherwise unremarkable. Right subclavian single lead transvenous pacemaker unchanged and appears intact. No soft tissue swelling over the generator pack on the lateral image. Linear scarring in the left lower lobe, unchanged dating back to the November, 2015. Lungs otherwise clear. Bronchovascular markings normal. Pulmonary vascularity normal. No visible pleural effusions. No pneumothorax. Visualized bony thorax intact. IVC filter noted.  IMPRESSION: Stable marked cardiomegaly. Stable linear scarring in the left lower lobe. No acute cardiopulmonary disease.   Electronically Signed   By: Hulan Saas M.D.   On: 02/12/2015 21:43   Dg Chest 2 View  02/04/2015   CLINICAL DATA:  Left chest pain with shortness of breath for 1 day. History of dialysis and pacemaker placement 1 month ago. Former smoker with history of pulmonary embolism. Initial encounter.  EXAM: CHEST  2 VIEW  COMPARISON:  Radiographs 01/25/2015.  CT 12/12/2014.  FINDINGS: Right subclavian pacemaker lead appears unchanged at the right ventricular apex. There is stable moderate cardiomegaly and vascular congestion. There is no edema, confluent airspace opacity or significant pleural effusion. The bones appear unchanged with bilateral distal clavicular osteolysis.   IMPRESSION: Stable chest with cardiomegaly and chronic vascular congestion. No acute findings.   Electronically Signed   By: Carey Bullocks M.D.   On: 02/04/2015 17:42   Ct Angio Chest Pe W/cm &/or Wo Cm  02/21/2015   CLINICAL DATA:  Chest pain abdominal pain for 2 days. Hemodialysis patient.  EXAM: CT ANGIOGRAPHY CHEST WITH CONTRAST  TECHNIQUE: Multidetector CT imaging of the chest was performed using the standard protocol during bolus administration of intravenous contrast. Multiplanar CT image reconstructions and MIPs were obtained to evaluate the vascular anatomy.  CONTRAST:  80mL OMNIPAQUE IOHEXOL 350 MG/ML SOLN  COMPARISON:  CT thorax 12/20/2014  FINDINGS: Mediastinum/Nodes: No filling defects within the pulmonary suggest acute pulmonary embolism. Acute findings aorta great vessels. The heart is enlarged. No pericardial fluid.  Lungs/Pleura: No evidence of pulmonary edema. There is trace right pleural effusion. No infiltrate or pneumothorax.  Upper abdomen: There is small amount of intra-abdominal free fluid. Kidneys are atrophic with multiple mixed density lesions.  Musculoskeletal: No acute findings of skeleton. There is a pacemaker in the right chest wall.  Review of the MIP images confirms the above findings.  IMPRESSION: 1. No evidence of pulmonary embolism. 2. Cardiomegaly without evidence of pulmonary edema. 3. Small right effusion. 4. Free fluid in the abdomen may relate to heart failure or end-stage renal disease. 5. Nodular kidneys unchanged from prior.   Electronically Signed   By: Genevive Bi M.D.   On: 02/21/2015 20:35   US Abdomen Complete  02/24/2015   CLINICAL DATA:  Abdominal distention  EXAM: ULTRASOUND ABDOMEN COMPLETE  COMPARISON:  01/14/2013  FINDINGS: Gallbladder: No gallstones or wall thickening visualized. No sonographic Murphy sign noted.  Common bile duct: Diameter: 3 mm  Liver: No focal lesion identified. Within normal limits in parenchymal echogenicity.  IVC: No  abnormality visualized.  Pancreas: Visualized portion unremarkable.  Spleen: Size and appearance within normal limits.  Right Kidney: Length: 8.5 cm in length. There is increased echogenicity throughout the renal cortex. Multiple cysts are again noted.  Left Kidney: Length: 9.1 cm in length. Increased cortical echogenicity. Multiple cysts are present throughout the left kidney.  Abdominal aorta: No aneurysm visualized.  Other findings: Small amount of ascites is present.  IMPRESSION: Small amount of ascites.  Chronic changes of the kidneys are not significantly changed.   Electronically Signed   By: Jolaine Click M.D.   On: 02/24/2015 08:39   Dg Chest Port 1 View  03/03/2015   CLINICAL DATA:  Chest pain and shortness breath. Nausea and vomiting.  EXAM: PORTABLE CHEST - 1 VIEW  COMPARISON:  None.  FINDINGS: Moderate cardiomegaly noted. Single lead transvenous pacemaker is seen in appropriate position.  Both lungs are well aerated and clear. No evidence of pleural effusion or pneumothorax.  IMPRESSION: Moderate cardiomegaly.  No active lung disease.   Electronically Signed   By: Myles Rosenthal M.D.   On: 03/03/2015 21:57    Micro Results     Recent Results (from the past 240 hour(s))  Blood culture (routine x 2)     Status: None (Preliminary result)   Collection Time: 03/02/15  7:41 PM  Result Value Ref Range Status   Specimen Description BLOOD RIGHT FOREARM  Final   Special Requests   Final    BOTTLES DRAWN AEROBIC AND ANAEROBIC 5CC EAC Immunocompromised   Culture   Final           BLOOD CULTURE RECEIVED NO GROWTH TO DATE CULTURE WILL BE HELD FOR 5 DAYS BEFORE ISSUING A FINAL NEGATIVE REPORT Performed at [facility]    Report Status PENDING  Incomplete       Today   Subjective:   Atziri Zubiate today has no headache,no chest abdominal pain,no new weakness tingling or numbness, feels much better wants to go home today.   Objective:   Blood pressure 108/71, pulse 86, temperature  98 F (36.7 C), temperature source Oral, resp. rate 18, height  (1.6 m), weight 59.3 kg (130 lb 11.7 oz), last menstrual period 08/27/2014, SpO2 100 %.   Intake/Output Summary (Last 24 hours) at 03/04/15 1502 Last data filed at 03/04/15 0700  Gross per 24 hour  Intake    430 ml  Output   2800 ml  Net  -2370 ml    Exam Awake Alert, Oriented x 3, No new F.N deficits, Normal affect [place].AT,PERRAL Supple Neck,No JVD, No cervical lymphadenopathy appriciated.  Symmetrical Chest wall movement, Good air movement bilaterally, CTAB RRR,No Gallops,Rubs or new Murmurs, No Parasternal Heave +ve B.Sounds, Abd Soft, Non tender, No organomegaly appriciated, No rebound -guarding or rigidity. No Cyanosis, Clubbing or edema, No new Rash or bruise, left AVF  Data Review   CBC w Diff: Lab Results  Component Value Date   WBC 7.2 03/03/2015   HGB 9.8* 03/03/2015   HCT 30.3* 03/03/2015   PLT 156 03/03/2015   LYMPHOPCT 36 03/03/2015   MONOPCT 9 03/03/2015   EOSPCT 1 03/03/2015   BASOPCT 0 03/03/2015    CMP: Lab Results  Component Value Date   NA 136 03/03/2015   K  6.7* 03/03/2015   CL 99* 03/03/2015   CO2 17* 03/03/2015   BUN 106* 03/03/2015   CREATININE 12.86* 03/03/2015   PROT 7.8 03/02/2015   ALBUMIN 3.2* 03/03/2015   BILITOT 0.6 03/02/2015   ALKPHOS 268* 03/02/2015   AST 22 03/02/2015   ALT 13* 03/02/2015  .   Total Time in preparing paper work, data evaluation and todays exam - 35 minutes  Drue Camera M.D on 03/04/2015 at 3:02 PM  [facility]   Office  [phone]

## 2015-03-04 NOTE — Discharge Instructions (Signed)
Follow with Primary MD Gwendolyn Fill G in 7 days   Get CBC, CMP, 2 view Chest X ray checked  by Primary MD next visit.    Activity: As tolerated with Full fall precautions use walker/cane & assistance as needed   Disposition Home    Diet: Renal diet , with feeding assistance and aspiration precautions.  For Heart failure patients - Check your Weight same time everyday, if you gain over 2 pounds, or you develop in leg swelling, experience more shortness of breath or chest pain, call your Primary MD immediately. Follow Cardiac Low Salt Diet and 1.5 lit/day fluid restriction.   On your next visit with your primary care physician please Get Medicines reviewed and adjusted.   Please request your Prim.MD to go over all Hospital Tests and Procedure/Radiological results at the follow up, please get all Hospital records sent to your Prim MD by signing hospital release before you go home.   If you experience worsening of your admission symptoms, develop shortness of breath, life threatening emergency, suicidal or homicidal thoughts you must seek medical attention immediately by calling 911 or calling your MD immediately  if symptoms less severe.  You Must read complete instructions/literature along with all the possible adverse reactions/side effects for all the Medicines you take and that have been prescribed to you. Take any new Medicines after you have completely understood and accpet all the possible adverse reactions/side effects.   Do not drive, operating heavy machinery, perform activities at heights, swimming or participation in water activities or provide baby sitting services if your were admitted for syncope or siezures until you have seen by Primary MD or a Neurologist and advised to do so again.  Do not drive when taking Pain medications.    Do not take more than prescribed Pain, Sleep and Anxiety Medications  Special Instructions: If you have smoked or chewed Tobacco  in the last  2 yrs please stop smoking, stop any regular Alcohol  and or any Recreational drug use.  Wear Seat belts while driving.   Please note  You were cared for by a hospitalist during your hospital stay. If you have any questions about your discharge medications or the care you received while you were in the hospital after you are discharged, you can call the unit and asked to speak with the hospitalist on call if the hospitalist that took care of you is not available. Once you are discharged, your primary care physician will handle any further medical issues. Please note that NO REFILLS for any discharge medications will be authorized once you are discharged, as it is imperative that you return to your primary care physician (or establish a relationship with a primary care physician if you do not have one) for your aftercare needs so that they can reassess your need for medications and monitor your lab values.

## 2015-03-04 NOTE — Progress Notes (Addendum)
Discharge instructions reviewed with pt; allowing time for questions. Pt verbalized understanding. IV removed without issue. Tele box removed and CCMD notified. Prescriptions given to pt. Discussed with pt importance of obtaining a PCP and continuing with set HD schedule. Pt states she will work to find a PCP but if she has to wait too long for an appointment that she would come back to the ED if her pain "gets too bad." Pt to leave unit via wheelchair accompanied by Tech.

## 2015-03-04 NOTE — Progress Notes (Signed)
Subjective:  Had HD yest- removed 2800- tolerated well- BP is good this AM- wants to go home Objective Vital signs in last 24 hours: Filed Vitals:   03/03/15 1800 03/03/15 1906 03/03/15 2041 03/04/15 0553  BP: 128/93 110/77 131/91 108/71  Pulse: 80 84 93 86  Temp:  97.8 F (36.6 C) 97.9 F (36.6 C) 98 F (36.7 C)  TempSrc:  Oral Oral Oral  Resp:  Height:      Weight:  59.6 kg (131 lb 6.3 oz) 59.3 kg (130 lb 11.7 oz)   SpO2:  100% 100% 100%   Weight change: 7.6 kg (16 lb 12.1 oz)  Intake/Output Summary (Last 24 hours) at 03/04/15 0827 Last data filed at 03/04/15 0600  Gross per 24 hour  Intake    310 ml  Output   2800 ml  Net  -2490 ml    Assessment/Plan: 1. HCAP- infiltrate on xray- per primary/ blood cultures pending. Levaquin per pharm. Afebrile- probably just volume- is better  2. ESRD - has spot in [place]- no HD since DC from [facility] on 5/5. HD pending today. K+ 5.4. Will see if can reconsider Small trial here ? 3. Hypertension/volume - 121/84 coreg. edw 53kgs. Need standing wts 4. Anemia - hgb 9.4/ tsat 19 on 5/2- start ESA and Fe today.  5. Metabolic bone disease - phos 8.2 cont home phoslo- no recent PTH- but is on sensipar- unsure dose- start s and check PTH today 6. Nutrition - alb 2.9 renal diet. Vitamin 7. Chronic chest pain- PE ruled out 5/1. Chronically elevated troponin 8. Noncompliance 9. Dispo- I talked with the Medical Director of the [facility] unit who will not accept her as Small patient for now.  She tells me she is going back to [place] for Small week and has talked to them and they are expecting her there on Saturday.  She will be able to stay there for Small week but then will be back here.  I have urged her to talk to the SW at her OP unit and request transfer through proper channels - maybe we will be able to get her placed here somewhere ?  I have told her this is up to her - she needs to fight and do the right thing for her health.  Is cleared for  discharge from my standpoint.  If stays- will do HD here tomorrow   [name] Small    Labs: Basic Metabolic Panel:  Recent Labs Lab 03/02/15 1800 03/03/15 0700 03/03/15 1449  NA 137 135 136  K 5.2* 5.4* 6.7*  CL 98* 100* 99*  CO2 20* 18* 17*  GLUCOSE 88 88 109*  BUN 92* 100* 106*  CREATININE 11.78* 12.40* 12.86*  CALCIUM 10.2 9.7 10.0  PHOS 7.6* 8.2* 8.9*   Liver Function Tests:  Recent Labs Lab 03/02/15 1800 03/03/15 0700 03/03/15 1449  AST 22  --   --   ALT 13*  --   --   ALKPHOS 268*  --   --   BILITOT 0.6  --   --   PROT 7.8  --   --   ALBUMIN 3.7 2.9* 3.2*    Recent Labs Lab 03/02/15 1800  LIPASE 41   No results for input(s): AMMONIA in the last 168 hours. CBC:  Recent Labs Lab 03/02/15 1800 03/03/15 0700 03/03/15 1450  WBC 7.6 7.1 7.2  NEUTROABS  --  3.9  --   HGB 10.6* 9.4* 9.8*  HCT  32.4* 29.2* 30.3*  MCV 90.3 89.8 89.4  PLT 148* 144* 156   Cardiac Enzymes:  Recent Labs Lab 03/02/15 2303 03/03/15 0354 03/03/15 1025  TROPONINI 0.15* 0.16* 0.14*   CBG: No results for input(s): GLUCAP in the last 168 hours.  Iron Studies: No results for input(s): IRON, TIBC, TRANSFERRIN, FERRITIN in the last 72 hours. Studies/Results: Dg Chest 2 View  03/02/2015   CLINICAL DATA:  Chronic renal failure. Cardiac arrhythmia. Generalized body aching  EXAM: CHEST  2 VIEW  COMPARISON:  None.  FINDINGS: There is Small focal area of infiltrate in the right lower lobe. Lungs elsewhere clear. Heart is enlarged with pulmonary vascularity within normal limits. Pacemaker lead is attached to the right ventricle. No adenopathy. No bone lesions. There is Small filter in the inferior vena cava.  IMPRESSION: Infiltrate right lower lobe. Cardiomegaly. Pacemaker as described. No pneumothorax.   Electronically Signed   By: Bretta Bang III M.D.   On: 03/02/2015 19:16   Dg Chest Port 1 View  03/03/2015   CLINICAL DATA:  Chest pain and shortness breath. Nausea and  vomiting.  EXAM: PORTABLE CHEST - 1 VIEW  COMPARISON:  None.  FINDINGS: Moderate cardiomegaly noted. Single lead transvenous pacemaker is seen in appropriate position.  Both lungs are well aerated and clear. No evidence of pleural effusion or pneumothorax.  IMPRESSION: Moderate cardiomegaly.  No active lung disease.   Electronically Signed   By: Myles Rosenthal M.D.   On: 03/03/2015 21:57   Medications: Infusions:    Scheduled Medications: . calcium acetate  2,001 mg Oral TID WC  . carvedilol  6.25 mg Oral BID WC  . cinacalcet  30 mg Oral Q supper  . darbepoetin (ARANESP) injection - DIALYSIS  40 mcg Intravenous Q Wed-HD  . diphenhydrAMINE  25 mg Oral Q8H  . famotidine  20 mg Oral Daily  . ferric gluconate (FERRLECIT/NULECIT) IV  125 mg Intravenous Q M,W,F-HD  . heparin  5,000 Units Subcutaneous 3 times per day  . isosorbide dinitrate  10 mg Oral TID  . LORazepam  1 mg Oral Once per day on Tue Thu Sat  . multivitamin  1 tablet Oral QHS  . sertraline  100 mg Oral Daily  . sodium chloride  3 mL Intravenous Q12H    have reviewed scheduled and prn medications.  Physical Exam: General: NAD Heart: RRR Lungs: mostly clear Abdomen: soft, no tender  Extremities: no edema Dialysis Access: left AVF     03/04/2015,8:27 AM  LOS: 2 days

## 2015-03-07 NOTE — Progress Notes (Signed)
Utilization review completed- retro 

## 2015-03-09 LAB — CULTURE, BLOOD (ROUTINE X 2): Culture: NO GROWTH

## 2015-04-02 ENCOUNTER — Emergency Department (HOSPITAL_BASED_OUTPATIENT_CLINIC_OR_DEPARTMENT_OTHER): Payer: Medicare Other

## 2015-04-02 ENCOUNTER — Encounter (HOSPITAL_BASED_OUTPATIENT_CLINIC_OR_DEPARTMENT_OTHER): Payer: Self-pay | Admitting: *Deleted

## 2015-04-02 ENCOUNTER — Inpatient Hospital Stay (HOSPITAL_BASED_OUTPATIENT_CLINIC_OR_DEPARTMENT_OTHER)
Admission: EM | Admit: 2015-04-02 | Discharge: 2015-04-05 | DRG: 682 | Disposition: A | Discharge status: Home / Self Care | Payer: Medicare Other | Attending: Internal Medicine | Admitting: Internal Medicine

## 2015-04-02 DIAGNOSIS — Z9119 Patient's noncompliance with other medical treatment and regimen: Secondary | ICD-10-CM

## 2015-04-02 DIAGNOSIS — R748 Abnormal levels of other serum enzymes: Secondary | ICD-10-CM | POA: Diagnosis present

## 2015-04-02 DIAGNOSIS — I319 Disease of pericardium, unspecified: Secondary | ICD-10-CM | POA: Diagnosis present

## 2015-04-02 DIAGNOSIS — M25552 Pain in left hip: Secondary | ICD-10-CM | POA: Diagnosis present

## 2015-04-02 DIAGNOSIS — Z95810 Presence of automatic (implantable) cardiac defibrillator: Secondary | ICD-10-CM | POA: Diagnosis not present

## 2015-04-02 DIAGNOSIS — Z9115 Patient's noncompliance with renal dialysis: Secondary | ICD-10-CM | POA: Diagnosis present

## 2015-04-02 DIAGNOSIS — Z87891 Personal history of nicotine dependence: Secondary | ICD-10-CM

## 2015-04-02 DIAGNOSIS — G8929 Other chronic pain: Secondary | ICD-10-CM | POA: Diagnosis present

## 2015-04-02 DIAGNOSIS — I120 Hypertensive chronic kidney disease with stage 5 chronic kidney disease or end stage renal disease: Secondary | ICD-10-CM | POA: Diagnosis present

## 2015-04-02 DIAGNOSIS — Z79899 Other long term (current) drug therapy: Secondary | ICD-10-CM | POA: Diagnosis not present

## 2015-04-02 DIAGNOSIS — N19 Unspecified kidney failure: Secondary | ICD-10-CM

## 2015-04-02 DIAGNOSIS — Q613 Polycystic kidney, unspecified: Secondary | ICD-10-CM

## 2015-04-02 DIAGNOSIS — I429 Cardiomyopathy, unspecified: Secondary | ICD-10-CM | POA: Diagnosis present

## 2015-04-02 DIAGNOSIS — I5023 Acute on chronic systolic (congestive) heart failure: Secondary | ICD-10-CM | POA: Diagnosis present

## 2015-04-02 DIAGNOSIS — Z590 Homelessness: Secondary | ICD-10-CM

## 2015-04-02 DIAGNOSIS — K219 Gastro-esophageal reflux disease without esophagitis: Secondary | ICD-10-CM | POA: Diagnosis present

## 2015-04-02 DIAGNOSIS — Z91041 Radiographic dye allergy status: Secondary | ICD-10-CM | POA: Diagnosis not present

## 2015-04-02 DIAGNOSIS — M7989 Other specified soft tissue disorders: Secondary | ICD-10-CM | POA: Diagnosis not present

## 2015-04-02 DIAGNOSIS — I440 Atrioventricular block, first degree: Secondary | ICD-10-CM | POA: Diagnosis present

## 2015-04-02 DIAGNOSIS — Z885 Allergy status to narcotic agent status: Secondary | ICD-10-CM | POA: Diagnosis not present

## 2015-04-02 DIAGNOSIS — I5022 Chronic systolic (congestive) heart failure: Secondary | ICD-10-CM | POA: Diagnosis present

## 2015-04-02 DIAGNOSIS — Z86711 Personal history of pulmonary embolism: Secondary | ICD-10-CM

## 2015-04-02 DIAGNOSIS — F329 Major depressive disorder, single episode, unspecified: Secondary | ICD-10-CM | POA: Diagnosis present

## 2015-04-02 DIAGNOSIS — N186 End stage renal disease: Secondary | ICD-10-CM | POA: Diagnosis present

## 2015-04-02 DIAGNOSIS — D571 Sickle-cell disease without crisis: Secondary | ICD-10-CM | POA: Diagnosis present

## 2015-04-02 DIAGNOSIS — R0789 Other chest pain: Secondary | ICD-10-CM | POA: Diagnosis not present

## 2015-04-02 DIAGNOSIS — D631 Anemia in chronic kidney disease: Secondary | ICD-10-CM | POA: Diagnosis present

## 2015-04-02 DIAGNOSIS — F32A Depression, unspecified: Secondary | ICD-10-CM | POA: Diagnosis present

## 2015-04-02 DIAGNOSIS — Z940 Kidney transplant status: Secondary | ICD-10-CM | POA: Diagnosis not present

## 2015-04-02 DIAGNOSIS — I2699 Other pulmonary embolism without acute cor pulmonale: Secondary | ICD-10-CM | POA: Diagnosis present

## 2015-04-02 DIAGNOSIS — I10 Essential (primary) hypertension: Secondary | ICD-10-CM | POA: Diagnosis not present

## 2015-04-02 DIAGNOSIS — R7989 Other specified abnormal findings of blood chemistry: Secondary | ICD-10-CM | POA: Diagnosis present

## 2015-04-02 DIAGNOSIS — Z91199 Patient's noncompliance with other medical treatment and regimen due to unspecified reason: Secondary | ICD-10-CM

## 2015-04-02 DIAGNOSIS — Z992 Dependence on renal dialysis: Secondary | ICD-10-CM | POA: Diagnosis not present

## 2015-04-02 DIAGNOSIS — R9431 Abnormal electrocardiogram [ECG] [EKG]: Secondary | ICD-10-CM

## 2015-04-02 DIAGNOSIS — R079 Chest pain, unspecified: Secondary | ICD-10-CM | POA: Diagnosis present

## 2015-04-02 DIAGNOSIS — E875 Hyperkalemia: Secondary | ICD-10-CM | POA: Diagnosis present

## 2015-04-02 DIAGNOSIS — R778 Other specified abnormalities of plasma proteins: Secondary | ICD-10-CM | POA: Diagnosis present

## 2015-04-02 HISTORY — DX: Other chronic pain: G89.29

## 2015-04-02 HISTORY — DX: Gastro-esophageal reflux disease without esophagitis: K21.9

## 2015-04-02 HISTORY — DX: Major depressive disorder, single episode, unspecified: F32.9

## 2015-04-02 HISTORY — DX: Depression, unspecified: F32.A

## 2015-04-02 LAB — CBC
HCT: 33.2 % — ABNORMAL LOW (ref 36.0–46.0)
Hemoglobin: 10.8 g/dL — ABNORMAL LOW (ref 12.0–15.0)
MCH: 28.3 pg (ref 26.0–34.0)
MCHC: 32.5 g/dL (ref 30.0–36.0)
MCV: 87.1 fL (ref 78.0–100.0)
PLATELETS: 245 10*3/uL (ref 150–400)
RBC: 3.81 MIL/uL — AB (ref 3.87–5.11)
RDW: 18.1 % — AB (ref 11.5–15.5)
WBC: 7.1 10*3/uL (ref 4.0–10.5)

## 2015-04-02 LAB — COMPREHENSIVE METABOLIC PANEL
ALK PHOS: 221 U/L — AB (ref 38–126)
ALT: 9 U/L — AB (ref 14–54)
ANION GAP: 21 — AB (ref 5–15)
AST: 21 U/L (ref 15–41)
Albumin: 3.6 g/dL (ref 3.5–5.0)
BUN: 133 mg/dL — AB (ref 6–20)
CALCIUM: 9.5 mg/dL (ref 8.9–10.3)
CO2: 15 mmol/L — AB (ref 22–32)
Chloride: 93 mmol/L — ABNORMAL LOW (ref 101–111)
Creatinine, Ser: 16.91 mg/dL — ABNORMAL HIGH (ref 0.44–1.00)
GFR calc Af Amer: 3 mL/min — ABNORMAL LOW (ref >60)
GFR calc non Af Amer: 3 mL/min — ABNORMAL LOW (ref >60)
Glucose, Bld: 109 mg/dL — ABNORMAL HIGH (ref 65–99)
Potassium: 7.1 mmol/L (ref 3.5–5.1)
Sodium: 129 mmol/L — ABNORMAL LOW (ref 135–145)
TOTAL PROTEIN: 7.7 g/dL (ref 6.5–8.1)
Total Bilirubin: 0.8 mg/dL (ref 0.3–1.2)

## 2015-04-02 LAB — CBC WITH DIFFERENTIAL/PLATELET
BASOS ABS: 0 10*3/uL (ref 0.0–0.1)
Basophils Relative: 0 % (ref 0–1)
EOS ABS: 0 10*3/uL (ref 0.0–0.7)
EOS PCT: 1 % (ref 0–5)
HEMATOCRIT: 33.3 % — AB (ref 36.0–46.0)
HEMOGLOBIN: 10.7 g/dL — AB (ref 12.0–15.0)
LYMPHS PCT: 28 % (ref 12–46)
Lymphs Abs: 1.9 10*3/uL (ref 0.7–4.0)
MCH: 28.3 pg (ref 26.0–34.0)
MCHC: 32.1 g/dL (ref 30.0–36.0)
MCV: 88.1 fL (ref 78.0–100.0)
MONOS PCT: 7 % (ref 3–12)
Monocytes Absolute: 0.5 10*3/uL (ref 0.1–1.0)
NEUTROS PCT: 64 % (ref 43–77)
Neutro Abs: 4.4 10*3/uL (ref 1.7–7.7)
PLATELETS: 250 10*3/uL (ref 150–400)
RBC: 3.78 MIL/uL — AB (ref 3.87–5.11)
RDW: 18.3 % — ABNORMAL HIGH (ref 11.5–15.5)
WBC: 6.9 10*3/uL (ref 4.0–10.5)

## 2015-04-02 LAB — RENAL FUNCTION PANEL
ANION GAP: 27 — AB (ref 5–15)
Albumin: 3.6 g/dL (ref 3.5–5.0)
BUN: 130 mg/dL — ABNORMAL HIGH (ref 6–20)
CALCIUM: 9.9 mg/dL (ref 8.9–10.3)
CO2: 16 mmol/L — AB (ref 22–32)
Chloride: 92 mmol/L — ABNORMAL LOW (ref 101–111)
Creatinine, Ser: 17.38 mg/dL — ABNORMAL HIGH (ref 0.44–1.00)
GFR calc non Af Amer: 2 mL/min — ABNORMAL LOW (ref >60)
GFR, EST AFRICAN AMERICAN: 3 mL/min — AB (ref >60)
GLUCOSE: 73 mg/dL (ref 65–99)
Phosphorus: 11.2 mg/dL — ABNORMAL HIGH (ref 2.5–4.6)
Potassium: 6.4 mmol/L (ref 3.5–5.1)
SODIUM: 135 mmol/L (ref 135–145)

## 2015-04-02 LAB — TROPONIN I
TROPONIN I: 0.63 ng/mL — AB (ref <0.031)
Troponin I: 0.56 ng/mL (ref <0.031)

## 2015-04-02 LAB — MAGNESIUM: Magnesium: 2.4 mg/dL (ref 1.7–2.4)

## 2015-04-02 LAB — POTASSIUM: POTASSIUM: 6.7 mmol/L — AB (ref 3.5–5.1)

## 2015-04-02 LAB — D-DIMER, QUANTITATIVE (NOT AT ARMC): D DIMER QUANT: 2.65 ug{FEU}/mL — AB (ref 0.00–0.48)

## 2015-04-02 LAB — MRSA PCR SCREENING: MRSA by PCR: NEGATIVE

## 2015-04-02 MED ORDER — OXYCODONE-ACETAMINOPHEN 5-325 MG PO TABS
1.0000 | ORAL_TABLET | Freq: Three times a day (TID) | ORAL | Status: DC | PRN
  Administered 2015-04-03 – 2015-04-05 (×5): 1 via ORAL
  Filled 2015-04-02 (×6): qty 1

## 2015-04-02 MED ORDER — SODIUM CHLORIDE 0.9 % IV SOLN
1.0000 g | Freq: Once | INTRAVENOUS | Status: AC
  Administered 2015-04-02: 1 g via INTRAVENOUS
  Filled 2015-04-02: qty 10

## 2015-04-02 MED ORDER — SODIUM CHLORIDE 0.9 % IJ SOLN
3.0000 mL | Freq: Two times a day (BID) | INTRAMUSCULAR | Status: DC
  Administered 2015-04-02 – 2015-04-04 (×4): 3 mL via INTRAVENOUS

## 2015-04-02 MED ORDER — FAMOTIDINE 20 MG PO TABS
20.0000 mg | ORAL_TABLET | Freq: Every day | ORAL | Status: DC | PRN
  Filled 2015-04-02: qty 1

## 2015-04-02 MED ORDER — LISINOPRIL 2.5 MG PO TABS
2.5000 mg | ORAL_TABLET | Freq: Two times a day (BID) | ORAL | Status: DC
  Administered 2015-04-03 (×3): 2.5 mg via ORAL
  Filled 2015-04-02 (×5): qty 1

## 2015-04-02 MED ORDER — ONDANSETRON HCL 4 MG/2ML IJ SOLN
4.0000 mg | Freq: Once | INTRAMUSCULAR | Status: AC
  Administered 2015-04-02: 4 mg via INTRAVENOUS

## 2015-04-02 MED ORDER — SODIUM CHLORIDE 0.9 % IV SOLN: 100.0000 mL | INTRAVENOUS | Status: DC | PRN

## 2015-04-02 MED ORDER — ALTEPLASE 2 MG IJ SOLR: 2.0000 mg | Freq: Once | INTRAMUSCULAR | Status: DC | PRN

## 2015-04-02 MED ORDER — HYDROMORPHONE HCL 1 MG/ML IJ SOLN
1.0000 mg | INTRAMUSCULAR | Status: DC | PRN
  Administered 2015-04-02 – 2015-04-03 (×2): 1 mg via INTRAVENOUS
  Filled 2015-04-02 (×2): qty 1

## 2015-04-02 MED ORDER — HEPARIN SODIUM (PORCINE) 1000 UNIT/ML DIALYSIS: 1000.0000 [IU] | INTRAMUSCULAR | Status: DC | PRN

## 2015-04-02 MED ORDER — SODIUM POLYSTYRENE SULFONATE 15 GM/60ML PO SUSP
45.0000 g | Freq: Once | ORAL | Status: AC
  Administered 2015-04-02: 45 g via ORAL
  Filled 2015-04-02: qty 180

## 2015-04-02 MED ORDER — HEPARIN SODIUM (PORCINE) 5000 UNIT/ML IJ SOLN: 5000.0000 [IU] | Freq: Three times a day (TID) | INTRAMUSCULAR | Status: DC

## 2015-04-02 MED ORDER — CALCIUM ACETATE (PHOS BINDER) 667 MG PO CAPS
2001.0000 mg | ORAL_CAPSULE | Freq: Three times a day (TID) | ORAL | Status: DC
  Administered 2015-04-03 – 2015-04-05 (×7): 2001 mg via ORAL
  Filled 2015-04-02 (×10): qty 3

## 2015-04-02 MED ORDER — ATORVASTATIN CALCIUM 40 MG PO TABS
40.0000 mg | ORAL_TABLET | Freq: Every day | ORAL | Status: DC
  Administered 2015-04-03 – 2015-04-05 (×4): 40 mg via ORAL
  Filled 2015-04-02 (×4): qty 1

## 2015-04-02 MED ORDER — AMLODIPINE BESYLATE 10 MG PO TABS
10.0000 mg | ORAL_TABLET | Freq: Every day | ORAL | Status: DC
  Administered 2015-04-02 – 2015-04-04 (×3): 10 mg via ORAL
  Filled 2015-04-02 (×4): qty 1

## 2015-04-02 MED ORDER — CALCIUM ACETATE 667 MG PO CAPS
1334.0000 mg | ORAL_CAPSULE | Freq: Three times a day (TID) | ORAL | Status: DC
  Administered 2015-04-03: 1334 mg via ORAL
  Filled 2015-04-02 (×7): qty 2

## 2015-04-02 MED ORDER — NEPRO/CARBSTEADY PO LIQD: 237.0000 mL | ORAL | Status: DC | PRN

## 2015-04-02 MED ORDER — RENA-VITE PO TABS
1.0000 | ORAL_TABLET | Freq: Every day | ORAL | Status: DC
  Administered 2015-04-02 – 2015-04-04 (×3): 1 via ORAL
  Filled 2015-04-02 (×4): qty 1

## 2015-04-02 MED ORDER — ALBUTEROL SULFATE (2.5 MG/3ML) 0.083% IN NEBU: 2.5000 mg | INHALATION_SOLUTION | Freq: Four times a day (QID) | RESPIRATORY_TRACT | Status: DC | PRN

## 2015-04-02 MED ORDER — ISOSORBIDE DINITRATE 10 MG PO TABS
10.0000 mg | ORAL_TABLET | Freq: Three times a day (TID) | ORAL | Status: DC
  Administered 2015-04-03 – 2015-04-05 (×7): 10 mg via ORAL
  Filled 2015-04-02 (×10): qty 1

## 2015-04-02 MED ORDER — LORAZEPAM 1 MG PO TABS
1.0000 mg | ORAL_TABLET | ORAL | Status: DC
  Administered 2015-04-03: 1 mg via ORAL
  Filled 2015-04-02: qty 2

## 2015-04-02 MED ORDER — PENTAFLUOROPROP-TETRAFLUOROETH EX AERO: 1.0000 "application " | INHALATION_SPRAY | CUTANEOUS | Status: DC | PRN

## 2015-04-02 MED ORDER — HYDROXYZINE HCL 50 MG/ML IM SOLN
25.0000 mg | Freq: Four times a day (QID) | INTRAMUSCULAR | Status: DC | PRN
  Filled 2015-04-02: qty 0.5

## 2015-04-02 MED ORDER — LIDOCAINE-PRILOCAINE 2.5-2.5 % EX CREA: 1.0000 "application " | TOPICAL_CREAM | CUTANEOUS | Status: DC | PRN

## 2015-04-02 MED ORDER — CARVEDILOL 6.25 MG PO TABS
6.2500 mg | ORAL_TABLET | Freq: Two times a day (BID) | ORAL | Status: DC
  Administered 2015-04-03 – 2015-04-05 (×4): 6.25 mg via ORAL
  Filled 2015-04-02 (×7): qty 1

## 2015-04-02 MED ORDER — DEXTROSE 50 % IV SOLN
1.0000 | Freq: Once | INTRAVENOUS | Status: AC
  Administered 2015-04-02: 50 mL via INTRAVENOUS
  Filled 2015-04-02: qty 50

## 2015-04-02 MED ORDER — CINACALCET HCL 30 MG PO TABS
30.0000 mg | ORAL_TABLET | Freq: Every day | ORAL | Status: DC
  Administered 2015-04-03 – 2015-04-05 (×3): 30 mg via ORAL
  Filled 2015-04-02 (×3): qty 1

## 2015-04-02 MED ORDER — INSULIN REGULAR HUMAN 100 UNIT/ML IJ SOLN
5.0000 [IU] | Freq: Once | INTRAMUSCULAR | Status: AC
  Administered 2015-04-02: 5 [IU] via SUBCUTANEOUS
  Filled 2015-04-02: qty 1

## 2015-04-02 MED ORDER — LIDOCAINE HCL (PF) 1 % IJ SOLN: 5.0000 mL | INTRAMUSCULAR | Status: DC | PRN

## 2015-04-02 MED ORDER — SERTRALINE HCL 100 MG PO TABS
100.0000 mg | ORAL_TABLET | Freq: Every day | ORAL | Status: DC
  Administered 2015-04-03 – 2015-04-05 (×4): 100 mg via ORAL
  Filled 2015-04-02 (×4): qty 1

## 2015-04-02 MED ORDER — LIDOCAINE-PRILOCAINE 2.5-2.5 % EX CREA
1.0000 "application " | TOPICAL_CREAM | CUTANEOUS | Status: DC | PRN
  Filled 2015-04-02: qty 5

## 2015-04-02 MED ORDER — ONDANSETRON HCL 4 MG/2ML IJ SOLN
INTRAMUSCULAR | Status: AC
  Administered 2015-04-02: 4 mg via INTRAVENOUS
  Filled 2015-04-02: qty 2

## 2015-04-02 MED ORDER — ALTEPLASE 2 MG IJ SOLR
2.0000 mg | Freq: Once | INTRAMUSCULAR | Status: AC | PRN
  Filled 2015-04-02: qty 2

## 2015-04-02 MED ORDER — DIPHENHYDRAMINE HCL 25 MG PO CAPS
25.0000 mg | ORAL_CAPSULE | Freq: Four times a day (QID) | ORAL | Status: DC | PRN
  Administered 2015-04-04: 25 mg via ORAL
  Filled 2015-04-02: qty 1

## 2015-04-02 MED ORDER — HYDRALAZINE HCL 20 MG/ML IJ SOLN
5.0000 mg | INTRAMUSCULAR | Status: DC | PRN
  Administered 2015-04-03: 5 mg via INTRAVENOUS
  Filled 2015-04-02: qty 1

## 2015-04-02 MED ORDER — HYDROMORPHONE HCL 1 MG/ML IJ SOLN: 0.5000 mg | Freq: Once | INTRAMUSCULAR | Status: DC

## 2015-04-02 MED ORDER — DIPHENHYDRAMINE HCL 50 MG/ML IJ SOLN
12.5000 mg | Freq: Once | INTRAMUSCULAR | Status: AC
Start: 2015-04-02 — End: 2015-04-02
  Administered 2015-04-02: 12.5 mg via INTRAVENOUS
  Filled 2015-04-02: qty 1

## 2015-04-02 MED ORDER — HYDROMORPHONE HCL 1 MG/ML IJ SOLN
1.0000 mg | Freq: Once | INTRAMUSCULAR | Status: AC
  Administered 2015-04-02: 1 mg via INTRAVENOUS
  Filled 2015-04-02: qty 1

## 2015-04-02 MED ORDER — HEPARIN SODIUM (PORCINE) 1000 UNIT/ML DIALYSIS
1000.0000 [IU] | INTRAMUSCULAR | Status: DC | PRN
  Filled 2015-04-02: qty 1

## 2015-04-02 MED ORDER — DIALYVITE PO TABS: 1.0000 | ORAL_TABLET | Freq: Every morning | ORAL | Status: DC

## 2015-04-02 MED ORDER — NITROGLYCERIN 0.4 MG SL SUBL: 0.4000 mg | SUBLINGUAL_TABLET | SUBLINGUAL | Status: DC | PRN

## 2015-04-02 NOTE — ED Notes (Signed)
Patient transported to X-ray 

## 2015-04-02 NOTE — ED Provider Notes (Signed)
CSN: [id number]     Arrival date & time 04/02/15  1524 History   First MD Initiated Contact with Patient 04/02/15 1647     Chief Complaint  Patient presents with  . chronic pain      (Consider location/radiation/quality/duration/timing/severity/associated sxs/prior Treatment) HPI [name] Small is a 24 y.o. female  With history of polycystic kidney disease currently in end-stage renal failure on dialysis, hx of PEs, pneumonia, HTN, sickle cell anemia,  presents to emergency department complaining of left hip pain, chest pain, shortness of breath. PT states left hip pain is chronic, worsened in the last several days. Taking persociet with no improvement. Recently hospitalized for pneumonia. States chest pain is different from that. States pain is sharp, on the right side. Does not radiate. Has not had dyalisis in 1 week. Pt sees a nephrologist in [place] now, but here visiting family. Denies fever or chills. No cough. No new injuries.   Past Medical History  Diagnosis Date  . Hemodialysis patient   . Hypertension   . Pulmonary emboli 01/2012    Bilateral, moderate clot burden, areas of pulmonary infarction and central necrosis  . CHF (congestive heart failure)   . Cardiomyopathy   . Dysrhythmia     at times per pt.  . Anemia   . H/O transfusion of packed red blood cells   . 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 Polycystic kidney disease   . Cellulitis and abscess of face 03/22/2013  . Renal insufficiency   . Sickle cell anemia   . Chronic pain    Past Surgical History  Procedure Laterality Date  . Nephrectomy    . Av fistula placement    . Kidney transplant  2008    failed  . Tonsillectomy      as a child.  . Adenoidectomy    . Incision and drainage abscess Right 03/21/2013    Procedure: INCISION AND DRAINAGE RIGHT CHEEK ABSCESS REMOVAL OF FOREIGN BODY;  Surgeon: Serena Colonel,  MD;  Location[facility];  Service: ENT;  Laterality: Right;  . Implantable cardioverter defibrillator implant Right 12/2014   Family History  Problem Relation Age of Onset  . Polycystic kidney disease Father    History  Substance Use Topics  . Smoking status: Former Smoker -- 0.20 packs/day    Quit date: 05/06/2012  . Smokeless tobacco: Never Used  . Alcohol Use: No   OB History    No data available     Review of Systems  Constitutional: Negative for fever and chills.  Respiratory: Positive for chest tightness and shortness of breath. Negative for cough.   Cardiovascular: Positive for chest pain. Negative for palpitations and leg swelling.  Gastrointestinal: Negative for nausea, vomiting, abdominal pain and diarrhea.  Genitourinary: Negative for vaginal bleeding, vaginal discharge, vaginal pain and pelvic pain.  Musculoskeletal: Positive for myalgias and arthralgias. Negative for neck pain and neck stiffness.  Skin: Negative for rash.  Neurological: Negative for dizziness, weakness and headaches.  All other systems reviewed and are negative.     Allergies  Tramadol; Iohexol; Morphine and related; and Vicodin  Home Medications   Prior to Admission medications   Medication Sig Start Date End Date Taking? Authorizing Provider  B Complex-C-Folic Acid (DIALYVITE PO) Take 1 tablet by mouth.    Historical Provider, MD  calcium acetate (PHOSLO) 667 MG capsule Take 2 capsules (1,334 mg total) by mouth  3 (three) times daily with meals. Take  with snacks 03/04/15   Starleen Arms, MD  carvedilol (COREG) 6.25 MG tablet Take 1 tablet (6.25 mg total) by mouth 2 (two) times daily. 03/04/15   Leana Roe Elgergawy, MD  cinacalcet (SENSIPAR) 30 MG tablet Take 1 tablet (30 mg total) by mouth daily with supper. 03/04/15   Leana Roe Elgergawy, MD  famotidine (PEPCID) 20 MG tablet Take 1 tablet (20 mg total) by mouth daily as needed for heartburn. 03/04/15   Leana Roe Elgergawy, MD  isosorbide  dinitrate (ISORDIL) 10 MG tablet Take 1 tablet (10 mg total) by mouth 3 (three) times daily. 03/04/15   Leana Roe Elgergawy, MD  lisinopril (PRINIVIL,ZESTRIL) 2.5 MG tablet Take 1 tablet (2.5 mg total) by mouth 2 (two) times daily. 03/04/15 03/03/16  Leana Roe Elgergawy, MD  LORazepam (ATIVAN) 1 MG tablet Take 1 tablet (1 mg total) by mouth See admin instructions. On dialysis days, before dialysis Patient taking differently: Take 1 mg by mouth 3 (three) times a week. Take 1 tablet (1 mg) prior to dialysis on Tuesday, Thursday and Saturday 01/29/15   Joseph Art, DO  oxyCODONE-acetaminophen (PERCOCET/ROXICET) 5-325 MG per tablet Take 1 tablet by mouth every 8 (eight) hours as needed for severe pain. Patient not taking: Reported on 03/03/2015 02/25/15   Zannie Cove, MD  sertraline (ZOLOFT) 100 MG tablet Take 1 tablet (100 mg total) by mouth daily. 03/04/15   Leana Roe Elgergawy, MD   BP 156/120 mmHg  Pulse 67  Temp(Src) 98.1 F (36.7 C) (Oral)  Resp 18  Ht  (1.6 m)  Wt 132 lb 4.4 oz (60 kg)  BMI 23.44 kg/m2  SpO2 96% Physical Exam  Constitutional: She is oriented to person, place, and time. She appears well-developed and well-nourished. No distress.  HENT:  Head: Normocephalic.  Eyes: Conjunctivae are normal.  Neck: Neck supple.  Cardiovascular: Normal rate, regular rhythm and normal heart sounds.   Pulmonary/Chest: Effort normal and breath sounds normal. No respiratory distress. She has no wheezes. She has no rales.  Abdominal: Soft. Bowel sounds are normal. She exhibits distension. There is no tenderness. There is no rebound and no guarding.  Musculoskeletal: She exhibits no edema.   Tender to palpation over left hip, full range of motion with pain.  Neurological: She is alert and oriented to person, place, and time.  Skin: Skin is warm and dry.  Psychiatric: She has a normal mood and affect. Her behavior is normal.  Nursing note and vitals reviewed.   ED Course  Procedures  (including critical care time) Labs Review Labs Reviewed  CBC WITH DIFFERENTIAL/PLATELET - Abnormal; Notable for the following:    RBC 3.78 (*)    Hemoglobin 10.7 (*)    HCT 33.3 (*)    RDW 18.3 (*)    All other components within normal limits  COMPREHENSIVE METABOLIC PANEL - Abnormal; Notable for the following:    Sodium 129 (*)    Potassium 7.1 (*)    Chloride 93 (*)    CO2 15 (*)    Glucose, Bld 109 (*)    BUN 133 (*)    Creatinine, Ser 16.91 (*)    ALT 9 (*)    Alkaline Phosphatase 221 (*)    GFR calc non Af Amer 3 (*)    GFR calc Af Amer 3 (*)    Anion gap 21 (*)    All other components within normal limits  TROPONIN I - Abnormal;  Notable for the following:    Troponin I 0.63 (*)    All other components within normal limits  POTASSIUM - Abnormal; Notable for the following:    Potassium 6.7 (*)    All other components within normal limits  MAGNESIUM    Imaging Review Dg Chest 2 View  04/02/2015   CLINICAL DATA:  Shortness of breath and chest pain  EXAM: CHEST - 2 VIEW  COMPARISON:  03/03/2015  FINDINGS: Cardiac shadow remains enlarged. A pacing device is again seen. The lungs are clear bilaterally. No acute bony abnormality is seen.  IMPRESSION: Stable cardiomegaly without acute abnormality.   Electronically Signed   By: Alcide Clever M.D.   On: 04/02/2015 17:33     EKG Interpretation  Date/Time:  Friday April 02 2015 17:34:13 EDT Ventricular Rate:  88 PR Interval:  316 QRS Duration: 102 QT Interval:  480 QTC Calculation: 580 R Axis:   97 Text Interpretation: Critical Test Result: Long QTc Sinus rhythm with 1st degree A-V block Rightward axis ST \\T \ T wave abnormality, consider lateral ischemia Prolonged QT Abnormal ECG QT prolongation is new, otherwise no significant change since Feb 21 2015 Reconfirmed by Criss Alvine  MD, SCOTT [phone]) on 04/02/2015 6:29:31 PM        MDM   Final diagnoses:  Hyperkalemia  Chest pain, unspecified chest pain type  Left hip pain   Prolonged Q-T interval on ECG  Non-compliance     Patient is here complaining of left hip pain, chest pain, shortness of breath.  Last dialysis was one week ago. Patient is mildly hypertensive, otherwise normal vital signs patient does have a history of PE, however I doubt in the setting of normal vital signs and no lower extremity pain or swelling. We'll get labs including troponin, chest x-ray ordered as well.    Patient's potassium 7.1. Treated with calcium gluconate, insulin with D50, Kayexalate.  EKG showed prolonged QT, no T-wave changes otherwise. Patient is in no distress. Dilaudid 1 mg was given for pain. Chest x-ray unremarkable.I spoke with Dr. Darrick Penna, who asked for pt to be admitted tomedicine.    Dr. Rayford Halsted spoke with [facility], accept for transfer.   Filed Vitals:   04/02/15 1531 04/02/15 1644 04/02/15 1830 04/02/15 1921  BP: 156/123 156/120 142/113 104/82  Pulse:  67  102  Temp: 98.1 F (36.7 C)     TempSrc: Oral     Resp: Height:  (1.6 m)     Weight: 132 lb 4.4 oz (60 kg)     SpO2: 100% 96%  99%      Jaynie Crumble, PA-C 04/02/15 2230  Pricilla Loveless, MD 04/04/15 1449

## 2015-04-02 NOTE — Consult Note (Signed)
Reason for Consult:hyperkalemia,fluid overload,uremia Referring Physician: Dr. Coral Spikes [name] Small is an 24 y.o. female.  HPI: 24 yr female with ESRD secondary to [place].  Hx of failed renal TX, severe nonadherence, HTN, anemia , HPTH, PE in past.  Has dialyzed in [place] in past but not now.  Has bounced around to diff dialysis units and not compliant at any.  Was here 2 X in May, once in Apr for acute therapy.  Chronic home unit with [facility] in [place]. Was in hosp there the last week in May also.  Has not dialyzed in at least 10d.  C/o N, SOB with minimal exertion, lying down, ankle edema and abdm swelling.  K 7.1, bicarb 15 , Cr 17 and BUN >130.        Has CM for HTN, chronic fluid overload, and ICD.  Severe HTN, anemia Depression , hx PE in distant past.  Dialyzes at [facility] on ?? since ???. Primary Nephrologist not clear. . . Access LUA AVF.  Past Medical History  Diagnosis Date  . Hemodialysis patient   . Hypertension   . Pulmonary emboli 01/2012    Bilateral, moderate clot burden, areas of pulmonary infarction and central necrosis  . CHF (congestive heart failure)   . Cardiomyopathy   . Dysrhythmia     at times per pt.  . Anemia   . H/O transfusion of packed red blood cells   . 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 Polycystic kidney disease   . Cellulitis and abscess of face 03/22/2013  . Renal insufficiency   . Sickle cell anemia   . Chronic pain   . GERD (gastroesophageal reflux disease)   . Depression     Past Surgical History  Procedure Laterality Date  . Nephrectomy    . Av fistula placement    . Kidney transplant  2008    failed  . Tonsillectomy      as a child.  . Adenoidectomy    . Incision and drainage abscess Right 03/21/2013    Procedure: INCISION AND DRAINAGE RIGHT CHEEK ABSCESS REMOVAL OF FOREIGN BODY;  Surgeon: Izora Gala, MD;  Location: [facility];   Service: ENT;  Laterality: Right;  . Implantable cardioverter defibrillator implant Right 12/2014    Family History  Problem Relation Age of Onset  . Polycystic kidney disease Father     Social History:  reports that she quit smoking about 2 years ago. She has never used smokeless tobacco. She reports that she does not drink alcohol or use illicit drugs.  Allergies:  Allergies  Allergen Reactions  . Tramadol Anaphylaxis  . Iohexol Itching  . Morphine And Related Itching    Ok with oxycodone  . Vicodin [Hydrocodone-Acetaminophen] Hives    Medications: I have reviewed the patient's current medications. And ones listed as home meds in [place]   Results for orders placed or performed during the hospital encounter of 04/02/15 (from the past 48 hour(s))  Troponin I     Status: Abnormal   Collection Time: 04/02/15  5:10 PM  Result Value Ref Range   Troponin I 0.63 (HH) <0.031 ng/mL    Comment:        POSSIBLE MYOCARDIAL ISCHEMIA. SERIAL TESTING RECOMMENDED. CRITICAL RESULT CALLED TO, READ BACK BY AND VERIFIED WITH: SCOTT BENNETT RN AT 1845 04/02/15 BY I.[email or web address] REPEATED TO VERIFY   CBC with Differential  Status: Abnormal   Collection Time: 04/02/15  5:15 PM  Result Value Ref Range   WBC 6.9 4.0 - 10.5 K/uL   RBC 3.78 (L) 3.87 - 5.11 MIL/uL   Hemoglobin 10.7 (L) 12.0 - 15.0 g/dL   HCT 33.3 (L) 36.0 - 46.0 %   MCV 88.1 78.0 - 100.0 fL   MCH 28.3 26.0 - 34.0 pg   MCHC 32.1 30.0 - 36.0 g/dL   RDW 18.3 (H) 11.5 - 15.5 %   Platelets 250 150 - 400 K/uL   Neutrophils Relative % 64 43 - 77 %   Neutro Abs 4.4 1.7 - 7.7 K/uL   Lymphocytes Relative 28 12 - 46 %   Lymphs Abs 1.9 0.7 - 4.0 K/uL   Monocytes Relative 7 3 - 12 %   Monocytes Absolute 0.5 0.1 - 1.0 K/uL   Eosinophils Relative 1 0 - 5 %   Eosinophils Absolute 0.0 0.0 - 0.7 K/uL   Basophils Relative 0 0 - 1 %   Basophils Absolute 0.0 0.0 - 0.1 K/uL  Comprehensive metabolic panel     Status: Abnormal   Collection  Time: 04/02/15  5:40 PM  Result Value Ref Range   Sodium 129 (L) 135 - 145 mmol/L   Potassium 7.1 (HH) 3.5 - 5.1 mmol/L    Comment: CRITICAL RESULT CALLED TO, READ BACK BY AND VERIFIED WITH: SCOTT BENNETT RN '@1810'  04/02/15 OLSONM REPEATED TO VERIFY CORRECTED ON 06/10 AT 1834: PREVIOUSLY REPORTED AS CRITICAL RESULT CALLED TO, READ BACK BY AND VERIFIED WITH: SCOTT BENNETT RN '@1810'  04/02/15 OLSONM REPEATED TO VERIFY    Chloride 93 (L) 101 - 111 mmol/L   CO2 15 (L) 22 - 32 mmol/L   Glucose, Bld 109 (H) 65 - 99 mg/dL   BUN 133 (H) 6 - 20 mg/dL    Comment: RESULTS CONFIRMED BY MANUAL DILUTION   Creatinine, Ser 16.91 (H) 0.44 - 1.00 mg/dL   Calcium 9.5 8.9 - 10.3 mg/dL   Total Protein 7.7 6.5 - 8.1 g/dL   Albumin 3.6 3.5 - 5.0 g/dL   AST 21 15 - 41 U/L   ALT 9 (L) 14 - 54 U/L   Alkaline Phosphatase 221 (H) 38 - 126 U/L   Total Bilirubin 0.8 0.3 - 1.2 mg/dL   GFR calc non Af Amer 3 (L) >60 mL/min   GFR calc Af Amer 3 (L) >60 mL/min    Comment: (NOTE) The eGFR has been calculated using the CKD EPI equation. This calculation has not been validated in all clinical situations. eGFR's persistently <60 mL/min signify possible Chronic Kidney Disease.    Anion gap 21 (H) 5 - 15  Potassium     Status: Abnormal   Collection Time: 04/02/15  6:33 PM  Result Value Ref Range   Potassium 6.7 (HH) 3.5 - 5.1 mmol/L    Comment: CRITICAL RESULT CALLED TO, READ BACK BY AND VERIFIED WITH: Malon Kindle RN AT 0932 04/02/15 BY I.[email or web address]   Magnesium     Status: None   Collection Time: 04/02/15  6:33 PM  Result Value Ref Range   Magnesium 2.4 1.7 - 2.4 mg/dL    Dg Chest 2 View  04/02/2015   CLINICAL DATA:  Shortness of breath and chest pain  EXAM: CHEST - 2 VIEW  COMPARISON:  03/03/2015  FINDINGS: Cardiac shadow remains enlarged. A pacing device is again seen. The lungs are clear bilaterally. No acute bony abnormality is seen.  IMPRESSION: Stable cardiomegaly without acute abnormality.  Electronically  Signed   By: Inez Catalina M.D.   On: 04/02/2015 17:33    ROS Blood pressure 162/115, pulse 95, temperature 97.8 F (36.6 C), temperature source Oral, resp. rate 20, height '5\' 2"'  (1.575 m), weight 62.5 kg (137 lb 12.6 oz), SpO2 97 %. Physical Exam Physical Examination: General appearance - edematous, chronically ill Mental status - alert, oriented to person, place, and time Eyes - severe silver wiring, av nicking Mouth - mucous membranes moist, pharynx normal without lesions Neck - adenopathy noted PCL Lymphatics - posterior cervical nodes Chest - rales noted bibasilar, decreased air entry noted bilat, ICD R [place] area Heart - S1 and S2 normal, rub at LSB, Gr 2/6 SEM, LV lift Abdomen - pos bs, liver down 5 cm, soft, striae Musculoskeletal - no joint tenderness, deformity or swelling Extremities - pedal edema 1+, 2  +presacral Skin - dry,   Assessment/Plan: 1 Severe nonadhernce primary issue. Always excuses.  Life threatening.  Has CM, ^ K 2 ESRD: life threatening ^ K, uremia.  Has pericarditis .  With severe uremia do not do effic HD, and no Hep.  Will remove some vol . Plan repeat HD in am. 3 Hypertension: severe add meds and lower vol 4. Anemia of ESRD: no meds at this time 5. Metabolic Bone Disease: will not add med at this time,just home binders 6 Severe hyperkalemia 10 Uremic pericarditis 11 Fluid Overload P HD, limit anticoag, give bp meds. ineffic HD, repeat in am.  Counsel   Cammeron Greis L 04/02/2015, 9:08 PM      ;d

## 2015-04-02 NOTE — ED Notes (Signed)
Attempt IV x 2 attempts. Right AC with blood return. Unable to flush.  Right lateral forearm attempted. No blood return noted.

## 2015-04-02 NOTE — Procedures (Signed)
I was present at this session.  I have reviewed the session itself and made appropriate changes.  HD via LUA AVF. BP ^, lower vol. Slow flows with severe uremia  [name] Small L 6/10/201610:45 PM

## 2015-04-02 NOTE — ED Notes (Signed)
Patient states she has not had dialysis in over one week because she is "trying to gets things stable".  States she is having bone pain from her bone disease and chest pain.

## 2015-04-02 NOTE — ED Notes (Signed)
IV insertion x 2 with [place], unsuccessful.

## 2015-04-02 NOTE — H&P (Addendum)
[facility] History and Physical  Kawthar Ennen ZOX:[id number] DOB: 1991/02/19 DOA: 04/02/2015  Referring physician: ED physician PCP: No PCP Per Patient  Specialists:   Chief Complaint: Chest pain  HPI: [name] Small is a 24 y.o. female with PMH of hypertension, systolic congestive heart failure (EF 35-40%), GERD, depression, history of polycystic kidney disease, failed kidney transplantation 2008, ESRD-HD, ICD placement 12/2014, ESRD-HD, history of pulmonary embolism 3 years ago, sickle cell anemia, chronic pain, chronic elevation of troponin, who presents with chest pain.  Patient reports that she has been having chest pain in the past 3 days. Chest pain is located in the right upper chest, constantly, 8 out of 10 in severity, sharp, nonradiating. It is pieuritic, and aggravated by deep breath. It is associated with mild shortness of breath and mild dry cough. She had several episode of shortness of breath, but no shortness currently. She also reports tenderness over calf areas bilaterally for 3 days. No history of long distance traveling recently. She has chronic left hip pain, which has not changed. She reports that she missed dialysis for 1 week. She sees nephrologist in [place] and is here for visiting family.  Currently patient denies fever, chills, running nose, ear pain, headaches, abdominal pain, diarrhea, constipation, dysuria, urgency, frequency, hematuria, skin rashes or leg swelling. No unilateral weakness, numbness or tingling sensations. No vision change or hearing loss.  In ED, patient was found to have potassium 7.1, creatinine 6.9, BUN when 33, elevated troponin 0.63 (previous troponin was 0.14 on 03/03/15), WBC 6.9, temperature normal, tachycardia, negative chest x-ray for acute abnormalities. Patient is admitted to inpatient for further evaluation and treatment. Renal was consulted by ED.  Where does patient live?   At home Can patient participate in ADLs?  Yes     Review of Systems:   General: no fevers, chills, has poor appetite, has fatigue HEENT: no blurry vision, hearing changes or sore throat Pulm: has dyspnea, coughing, no wheezing CV: has chest pain,no palpitations Abd: no nausea, vomiting, abdominal pain, diarrhea, constipation GU: no dysuria, burning on urination, increased urinary frequency, hematuria  Ext: no leg edema Neuro: no unilateral weakness, numbness, or tingling, no vision change or hearing loss Skin: no rash MSK: No muscle spasm, no deformity, no limitation of range of movement in spin Heme: No easy bruising.  Travel history: No recent long distant travel.  Allergy:  Allergies  Allergen Reactions  . Tramadol Anaphylaxis  . Iohexol Itching  . Morphine And Related Itching    Ok with oxycodone  . Vicodin [Hydrocodone-Acetaminophen] Hives    Past Medical History  Diagnosis Date  . Hemodialysis patient   . Hypertension   . Pulmonary emboli 01/2012    Bilateral, moderate clot burden, areas of pulmonary infarction and central necrosis  . CHF (congestive heart failure)   . Cardiomyopathy   . Dysrhythmia     at times per pt.  . Anemia   . H/O transfusion of packed red blood cells   . 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 Polycystic kidney disease   . Cellulitis and abscess of face 03/22/2013  . Renal insufficiency   . Sickle cell anemia   . Chronic pain   . GERD (gastroesophageal reflux disease)   . Depression     Past Surgical History  Procedure Laterality Date  . Nephrectomy    . Av fistula placement    . Kidney  transplant  2008    failed  . Tonsillectomy      as a child.  . Adenoidectomy    . Incision and drainage abscess Right 03/21/2013    Procedure: INCISION AND DRAINAGE RIGHT CHEEK ABSCESS REMOVAL OF FOREIGN BODY;  Surgeon: Serena Colonel, MD;  Location[facility] OR;  Service: ENT;  Laterality: Right;  .  Implantable cardioverter defibrillator implant Right 12/2014    Social History:  reports that she quit smoking about 2 years ago. She has never used smokeless tobacco. She reports that she does not drink alcohol or use illicit drugs.  Family History:  Family History  Problem Relation Age of Onset  . Polycystic kidney disease Father      Prior to Admission medications   Medication Sig Start Date End Date Taking? Authorizing Provider  B Complex-C-Folic Acid (DIALYVITE PO) Take 1 tablet by mouth.    Historical Provider, MD  calcium acetate (PHOSLO) 667 MG capsule Take 2 capsules (1,334 mg total) by mouth 3 (three) times daily with meals. Take 667mg  with snacks 03/04/15   Starleen Arms, MD  carvedilol (COREG) 6.25 MG tablet Take 1 tablet (6.25 mg total) by mouth 2 (two) times daily. 03/04/15   Leana Roe Elgergawy, MD  cinacalcet (SENSIPAR) 30 MG tablet Take 1 tablet (30 mg total) by mouth daily with supper. 03/04/15   Leana Roe Elgergawy, MD  famotidine (PEPCID) 20 MG tablet Take 1 tablet (20 mg total) by mouth daily as needed for heartburn. 03/04/15   Leana Roe Elgergawy, MD  isosorbide dinitrate (ISORDIL) 10 MG tablet Take 1 tablet (10 mg total) by mouth 3 (three) times daily. 03/04/15   Leana Roe Elgergawy, MD  lisinopril (PRINIVIL,ZESTRIL) 2.5 MG tablet Take 1 tablet (2.5 mg total) by mouth 2 (two) times daily. 03/04/15 03/03/16  Leana Roe Elgergawy, MD  LORazepam (ATIVAN) 1 MG tablet Take 1 tablet (1 mg total) by mouth See admin instructions. On dialysis days, before dialysis Patient taking differently: Take 1 mg by mouth 3 (three) times a week. Take 1 tablet (1 mg) prior to dialysis on Tuesday, Thursday and Saturday 01/29/15   Joseph Art, DO  oxyCODONE-acetaminophen (PERCOCET/ROXICET) 5-325 MG per tablet Take 1 tablet by mouth every 8 (eight) hours as needed for severe pain. Patient not taking: Reported on 03/03/2015 02/25/15   Zannie Cove, MD  sertraline (ZOLOFT) 100 MG tablet Take 1 tablet  (100 mg total) by mouth daily. 03/04/15   Starleen Arms, MD    Physical Exam: Filed Vitals:   04/02/15 1921 04/02/15 1949 04/02/15 2046 04/02/15 2154  BP: 104/82 107/87 162/115 159/136  Pulse: 102 99 95   Temp:   97.8 F (36.6 C)   TempSrc:   Oral   Resp: 20 18 20    Height:   5\' 2"  (1.575 m)   Weight:   62.5 kg (137 lb 12.6 oz)   SpO2: 99% 95% 97%    General: Not in acute distress HEENT:       Eyes: PERRL, EOMI, no scleral icterus.       ENT: No discharge from the ears and nose, no pharynx injection, no tonsillar enlargement.        Neck: No JVD, no bruit, no mass felt. Heme: No neck lymph node enlargement. Cardiac: S1/S2, RRR, tachycardia, No murmurs, No gallops or rubs. Pulm: Good air movement bilaterally. No rales, wheezing, rhonchi or rubs. Abd: Soft, nondistended, nontender, no rebound pain, no organomegaly, BS present. Ext: No pitting leg edema  bilaterally. 2+DP/PT pulse bilaterally. There is tenderness over calf areas bilaterally. Musculoskeletal: No joint deformities, No joint redness or warmth, no limitation of ROM in spin. Skin: No rashes.  Neuro: Alert, oriented X3, cranial nerves II-XII grossly intact, muscle strength 5/5 in all extremities, sensation to light touch intact. Psych: Patient is not psychotic, no suicidal or hemocidal ideation.  Labs on Admission:  Basic Metabolic Panel:  Recent Labs Lab 04/02/15 1740 04/02/15 1833  NA 129*  --   K 7.1* 6.7*  CL 93*  --   CO2 15*  --   GLUCOSE 109*  --   BUN 133*  --   CREATININE 16.91*  --   CALCIUM 9.5  --   MG  --  2.4   Liver Function Tests:  Recent Labs Lab 04/02/15 1740  AST 21  ALT 9*  ALKPHOS 221*  BILITOT 0.8  PROT 7.7  ALBUMIN 3.6   No results for input(s): LIPASE, AMYLASE in the last 168 hours. No results for input(s): AMMONIA in the last 168 hours. CBC:  Recent Labs Lab 04/02/15 1715  WBC 6.9  NEUTROABS 4.4  HGB 10.7*  HCT 33.3*  MCV 88.1  PLT 250   Cardiac  Enzymes:  Recent Labs Lab 04/02/15 1710  TROPONINI 0.63*    BNP (last 3 results) No results for input(s): BNP in the last 8760 hours.  ProBNP (last 3 results) No results for input(s): PROBNP in the last 8760 hours.  CBG: No results for input(s): GLUCAP in the last 168 hours.  Radiological Exams on Admission: Dg Chest 2 View  04/02/2015   CLINICAL DATA:  Shortness of breath and chest pain  EXAM: CHEST - 2 VIEW  COMPARISON:  03/03/2015  FINDINGS: Cardiac shadow remains enlarged. A pacing device is again seen. The lungs are clear bilaterally. No acute bony abnormality is seen.  IMPRESSION: Stable cardiomegaly without acute abnormality.   Electronically Signed   By: Alcide Clever M.D.   On: 04/02/2015 17:33    EKG: Independently reviewed.  Abnormal findings: QTC 580, first-degree AV block with PR interval 316, T-wave inversion in inferior leads and V5-V6, right axis deviation (patient had right axis deviation on EKG 02/12/15, but not on EKG 02/21/15)  Assessment/Plan Principal Problem:   Chest pain Active Problems:   ESRD (end stage renal disease) on dialysis   HTN (hypertension)   PE (pulmonary embolism)   Systolic CHF, chronic   Hyperkalemia   Chronically Elevated troponin   Uremia   GERD (gastroesophageal reflux disease)   Depression   Pain in left hip   Pain in the chest   Left hip pain  Chest pain: Patient has history of PE 3 years ago. Now presents with pleuritic chest pain and episodic SOB, concerning for PE. She also reports tenderness over calf areas bilaterally, which is consistent with PE. Currently patient is hemodynamically stable, with mild tachycardia with good oxygen saturation on room air. Another possibility is ACS given her risk factors, including hypertension, sickle cell anemia, congestive heart failure, will need to r/o ACS. CXR negative for PNA.  - will admit to SDU - cycle CE q6 x3 and repeat her EKG in the am  - prn Nitroglycerin, dilaudid -start  lipitor -will not start ASA, since her cardiologist, Dr, Katherina Right in [place] [facility] told her that she should never take ASA, reason is not clear. - Risk factor stratification: will check FLP, A1C, TSH - Consider cardiology consult in AM - 2d echo - Stat D-dimer, if  positive, will get CTA to r/o PE - LE doppler to r/o DVT   Addendum: Patient has elevated d-dimer. I highly suspect patient has PE. Per Dr.Deterding, it is better to not give patient any contrast for CTA. Will restart heparin IV empirically for PE and get VQ scan.   Hyperkalemia: K=7.1.This is obeviousely due to having missed hemodialysis -treated with calcium, insulin, D50, Kayexalate 45 g in emergency room. -renal was consulted for dialysis.  ESRD-HD: missed dialysis for 1 week. Cre 16.9, BUN 133, potassium 7.1 and bicarbonate 15.  -renal was consulted for dialysis. -continue PhosLo, Sensipar  HTN:  -continue lisinopril, -continue coreg -Hydralazine IV when necessary  Systolic CHF, chronic: 2-D echo 02/22/15 showed EF 35-40%. Clinically patient is dry on admission. No leg edema. - renal helps in volume management.  Chronically Elevated troponin: Trop 0.14 on 03/03/15-->0.63. Etiology is not clear. 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 -trop x 6 -follow up PE workup and 2d ehco  GERD: -Pepcide  Pain in left hip: chronic issue -continue home percocet   DVT ppx: SQ Heparin     Code Status: Full code Family Communication: None at bed side.    Disposition Plan: Admit to inpatient   Date of Service 04/02/2015    Lorretta Harp [facility] Pager [phone]  If 7PM-7AM, please contact night-coverage [email or web address] Password [facility] 04/02/2015, 10:21 PM

## 2015-04-02 NOTE — ED Notes (Signed)
Pt c/o " over all pain " HX chronic pain and also reports no dialysis x 1 week

## 2015-04-03 ENCOUNTER — Inpatient Hospital Stay (HOSPITAL_COMMUNITY): Payer: Medicare Other

## 2015-04-03 ENCOUNTER — Encounter (HOSPITAL_COMMUNITY): Payer: Medicare Other

## 2015-04-03 ENCOUNTER — Other Ambulatory Visit (HOSPITAL_COMMUNITY): Payer: Medicare Other

## 2015-04-03 DIAGNOSIS — N186 End stage renal disease: Secondary | ICD-10-CM

## 2015-04-03 DIAGNOSIS — R079 Chest pain, unspecified: Secondary | ICD-10-CM

## 2015-04-03 DIAGNOSIS — Z992 Dependence on renal dialysis: Secondary | ICD-10-CM

## 2015-04-03 DIAGNOSIS — E875 Hyperkalemia: Secondary | ICD-10-CM

## 2015-04-03 LAB — PHOSPHORUS: Phosphorus: 9.8 mg/dL — ABNORMAL HIGH (ref 2.5–4.6)

## 2015-04-03 LAB — LIPID PANEL
CHOL/HDL RATIO: 3.7 ratio
Cholesterol: 188 mg/dL (ref 0–200)
HDL: 51 mg/dL (ref >40)
LDL Cholesterol: 104 mg/dL — ABNORMAL HIGH (ref 0–99)
TRIGLYCERIDES: 167 mg/dL — AB (ref <150)
VLDL: 33 mg/dL (ref 0–40)

## 2015-04-03 LAB — CBC
HCT: 32.3 % — ABNORMAL LOW (ref 36.0–46.0)
HCT: 30 % — ABNORMAL LOW (ref 36.0–46.0)
HEMOGLOBIN: 10.7 g/dL — AB (ref 12.0–15.0)
Hemoglobin: 9.8 g/dL — ABNORMAL LOW (ref 12.0–15.0)
MCH: 28.7 pg (ref 26.0–34.0)
MCH: 28.2 pg (ref 26.0–34.0)
MCHC: 33.1 g/dL (ref 30.0–36.0)
MCHC: 32.7 g/dL (ref 30.0–36.0)
MCV: 86.6 fL (ref 78.0–100.0)
MCV: 86.2 fL (ref 78.0–100.0)
Platelets: 210 10*3/uL (ref 150–400)
Platelets: 198 10*3/uL (ref 150–400)
RBC: 3.73 MIL/uL — ABNORMAL LOW (ref 3.87–5.11)
RBC: 3.48 MIL/uL — ABNORMAL LOW (ref 3.87–5.11)
RDW: 18 % — AB (ref 11.5–15.5)
RDW: 17.9 % — ABNORMAL HIGH (ref 11.5–15.5)
WBC: 6.1 10*3/uL (ref 4.0–10.5)
WBC: 6.8 10*3/uL (ref 4.0–10.5)

## 2015-04-03 LAB — BASIC METABOLIC PANEL
ANION GAP: 22 — AB (ref 5–15)
BUN: 138 mg/dL — AB (ref 6–20)
CHLORIDE: 94 mmol/L — AB (ref 101–111)
CO2: 20 mmol/L — ABNORMAL LOW (ref 22–32)
Calcium: 9.8 mg/dL (ref 8.9–10.3)
Creatinine, Ser: 17.47 mg/dL — ABNORMAL HIGH (ref 0.44–1.00)
GFR calc Af Amer: 3 mL/min — ABNORMAL LOW (ref >60)
GFR, EST NON AFRICAN AMERICAN: 2 mL/min — AB (ref >60)
GLUCOSE: 83 mg/dL (ref 65–99)
POTASSIUM: 5.9 mmol/L — AB (ref 3.5–5.1)
SODIUM: 136 mmol/L (ref 135–145)

## 2015-04-03 LAB — TROPONIN I
TROPONIN I: 0.61 ng/mL — AB (ref <0.031)
Troponin I: 0.59 ng/mL (ref <0.031)

## 2015-04-03 LAB — COMPREHENSIVE METABOLIC PANEL
ALT: 9 U/L — ABNORMAL LOW (ref 14–54)
AST: 22 U/L (ref 15–41)
Albumin: 3.4 g/dL — ABNORMAL LOW (ref 3.5–5.0)
Alkaline Phosphatase: 214 U/L — ABNORMAL HIGH (ref 38–126)
Anion gap: 21 — ABNORMAL HIGH (ref 5–15)
BUN: 78 mg/dL — ABNORMAL HIGH (ref 6–20)
CALCIUM: 10.3 mg/dL (ref 8.9–10.3)
CO2: 20 mmol/L — ABNORMAL LOW (ref 22–32)
Chloride: 95 mmol/L — ABNORMAL LOW (ref 101–111)
Creatinine, Ser: 12.36 mg/dL — ABNORMAL HIGH (ref 0.44–1.00)
GFR calc Af Amer: 4 mL/min — ABNORMAL LOW (ref >60)
GFR calc non Af Amer: 4 mL/min — ABNORMAL LOW (ref >60)
Glucose, Bld: 78 mg/dL (ref 65–99)
Potassium: 4.1 mmol/L (ref 3.5–5.1)
Sodium: 136 mmol/L (ref 135–145)
TOTAL PROTEIN: 7.4 g/dL (ref 6.5–8.1)
Total Bilirubin: 1 mg/dL (ref 0.3–1.2)

## 2015-04-03 LAB — HEPARIN LEVEL (UNFRACTIONATED): HEPARIN UNFRACTIONATED: 0.22 [IU]/mL — AB (ref 0.30–0.70)

## 2015-04-03 LAB — TSH: TSH: 10.706 u[IU]/mL — AB (ref 0.350–4.500)

## 2015-04-03 MED ORDER — HEPARIN BOLUS VIA INFUSION
4000.0000 [IU] | Freq: Once | INTRAVENOUS | Status: AC
  Administered 2015-04-03: 4000 [IU] via INTRAVENOUS
  Filled 2015-04-03: qty 4000

## 2015-04-03 MED ORDER — METHYLPREDNISOLONE SODIUM SUCC 125 MG IJ SOLR
125.0000 mg | Freq: Once | INTRAMUSCULAR | Status: DC
Start: 2015-04-03 — End: 2015-04-05

## 2015-04-03 MED ORDER — HEPARIN (PORCINE) IN NACL 100-0.45 UNIT/ML-% IJ SOLN
1000.0000 [IU]/h | INTRAMUSCULAR | Status: DC
  Administered 2015-04-03: 1000 [IU]/h via INTRAVENOUS
  Filled 2015-04-03: qty 250

## 2015-04-03 MED ORDER — HYDROMORPHONE HCL 1 MG/ML IJ SOLN
0.5000 mg | INTRAMUSCULAR | Status: DC | PRN
  Administered 2015-04-03 – 2015-04-04 (×6): 0.5 mg via INTRAVENOUS
  Filled 2015-04-03 (×6): qty 1

## 2015-04-03 MED ORDER — HEPARIN SODIUM (PORCINE) 5000 UNIT/ML IJ SOLN
5000.0000 [IU] | Freq: Three times a day (TID) | INTRAMUSCULAR | Status: DC
  Administered 2015-04-03 – 2015-04-04 (×4): 5000 [IU] via SUBCUTANEOUS
  Filled 2015-04-03 (×9): qty 1

## 2015-04-03 MED ORDER — DIPHENHYDRAMINE HCL 50 MG PO CAPS: 50.0000 mg | ORAL_CAPSULE | Freq: Once | ORAL | Status: DC

## 2015-04-03 MED ORDER — TECHNETIUM TO 99M ALBUMIN AGGREGATED: 6.0000 | Freq: Once | INTRAVENOUS | Status: AC | PRN

## 2015-04-03 MED ORDER — HYDRALAZINE HCL 20 MG/ML IJ SOLN
5.0000 mg | Freq: Once | INTRAMUSCULAR | Status: AC
  Administered 2015-04-03: 5 mg via INTRAVENOUS
  Filled 2015-04-03: qty 1

## 2015-04-03 MED ORDER — TECHNETIUM TC 99M DIETHYLENETRIAME-PENTAACETIC ACID: 42.0000 | Freq: Once | INTRAVENOUS | Status: AC | PRN

## 2015-04-03 NOTE — Progress Notes (Signed)
Spoke to Dr. Darrick Penna concerning orders for a ct angiogram with contrast. Was told not to give contrast.  Dr. Clyde Lundborg made aware. Dr. Youlanda Roys ordered a heparin drip and VQ scan.

## 2015-04-03 NOTE — Progress Notes (Signed)
CRITICAL VALUE ALERT  Critical value received:  Potassium 6.4                                        Triponin 0.56  Date of notification:  04/02/2015  Time of notification:  2330  Critical value read back:yes  Nurse who received alert:  Cristy Friedlander  MD notified (1st page): Dr. Darrick Penna  Time of first page: 2335  MD notified (2nd page):  Time of second page:  Responding MD: Dr. Darrick Penna  Time MD responded: 2337

## 2015-04-03 NOTE — Progress Notes (Signed)
V/q scan negative- d/c heparin prob pericarditis Ryane Canavan

## 2015-04-03 NOTE — Progress Notes (Signed)
  Echocardiogram 2D Echocardiogram has been performed.  Leta Jungling M 04/03/2015, 4:16 PM

## 2015-04-03 NOTE — Progress Notes (Addendum)
PT Cancellation Note  Patient Details Name: [name] Small MRN: [id number] DOB: 1991/07/28   Cancelled Treatment:    Reason Eval/Treat Not Completed: Medical issues which prohibited therapy.  Patient with increased troponin, K+ of 5.9.  Also awaiting VQ scan and dopplers.  Patient on HD at this time.  Will return tomorrow for PT evaluation.   [name] 04/03/2015, 10:31 AM [name]. Renaldo Fiddler, [facility] Pager [phone]

## 2015-04-03 NOTE — Progress Notes (Signed)
  Echocardiogram 2D Echocardiogram has been performed.  [name] Small 04/03/2015, 4:16 PM 

## 2015-04-03 NOTE — Procedures (Signed)
I was present at this session.  I have reviewed the session itself and made appropriate changes.  HD via LUA AVF.  Again low flows and time with severe uremia. Lower vol  Charleigh Correnti L 6/11/201611:26 AM

## 2015-04-03 NOTE — Progress Notes (Signed)
ANTICOAGULATION CONSULT NOTE - Initial Consult  Pharmacy Consult for Heparin Indication: R/O PE  Allergies  Allergen Reactions  . Tramadol Anaphylaxis  . Iohexol Itching  . Morphine And Related Itching    Ok with oxycodone  . Vicodin [Hydrocodone-Acetaminophen] Hives    Patient Measurements: Height: 5\' 2"  (157.5 cm) Weight: 128 lb 4.9 oz (58.2 kg) IBW/kg (Calculated) : 50.1  Vital Signs: Temp: 96.9 F (36.1 C) (06/11 0202) Temp Source: Oral (06/11 0202) BP: 143/101 mmHg (06/11 0210) Pulse Rate: 104 (06/11 0210)  Labs:  Recent Labs  04/02/15 1710 04/02/15 1715 04/02/15 1740 04/02/15 2205 04/03/15 0050  HGB  --  10.7*  --  10.8*  --   HCT  --  33.3*  --  33.2*  --   PLT  --  250  --  245  --   CREATININE  --   --  16.91* 17.38* 17.47*  TROPONINI 0.63*  --   --  0.56* 0.59*    Estimated Creatinine Clearance: 4 mL/min (by C-G formula based on Cr of 17.47).   Medical History: Past Medical History  Diagnosis Date  . Hemodialysis patient   . Hypertension   . Pulmonary emboli 01/2012    Bilateral, moderate clot burden, areas of pulmonary infarction and central necrosis  . CHF (congestive heart failure)   . Cardiomyopathy   . Dysrhythmia     at times per pt.  . Anemia   . H/O transfusion of packed red blood cells   . 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 Polycystic kidney disease   . Cellulitis and abscess of face 03/22/2013  . Renal insufficiency   . Sickle cell anemia   . Chronic pain   . GERD (gastroesophageal reflux disease)   . Depression     Medications:  Prescriptions prior to admission  Medication Sig Dispense Refill Last Dose  . B Complex-C-Folic Acid (DIALYVITE PO) Take 1 tablet by mouth.   03/02/2015 at Unknown time  . calcium acetate (PHOSLO) 667 MG capsule Take 2 capsules (1,334 mg total) by mouth 3 (three) times daily with meals. Take 667mg   with snacks 120 capsule 0   . carvedilol (COREG) 6.25 MG tablet Take 1 tablet (6.25 mg total) by mouth 2 (two) times daily. 60 tablet 0   . cinacalcet (SENSIPAR) 30 MG tablet Take 1 tablet (30 mg total) by mouth daily with supper. 60 tablet 0   . famotidine (PEPCID) 20 MG tablet Take 1 tablet (20 mg total) by mouth daily as needed for heartburn. 60 tablet 0   . isosorbide dinitrate (ISORDIL) 10 MG tablet Take 1 tablet (10 mg total) by mouth 3 (three) times daily. 60 tablet 0   . lisinopril (PRINIVIL,ZESTRIL) 2.5 MG tablet Take 1 tablet (2.5 mg total) by mouth 2 (two) times daily. 60 tablet 0   . LORazepam (ATIVAN) 1 MG tablet Take 1 tablet (1 mg total) by mouth See admin instructions. On dialysis days, before dialysis (Patient taking differently: Take 1 mg by mouth 3 (three) times a week. Take 1 tablet (1 mg) prior to dialysis on Tuesday, Thursday and Saturday) 12 tablet 0 03/02/2015 at Unknown time  . oxyCODONE-acetaminophen (PERCOCET/ROXICET) 5-325 MG per tablet Take 1 tablet by mouth every 8 (eight) hours as needed for severe pain. (Patient not taking: Reported on 03/03/2015) 10 tablet 0 Completed Course at Unknown time  . sertraline (ZOLOFT) 100 MG  tablet Take 1 tablet (100 mg total) by mouth daily. 20 tablet 0     Assessment: 24 y.o. female with chest pain/elevated D-Dimer, possible PE, for heparin  Goal of Therapy:  Heparin level 0.3-0.7 units/ml Monitor platelets by anticoagulation protocol: Yes   Plan:  Heparin 4000 units IV bolus, then start heparin 1000 units/hr Check heparin level in 8 hours.   Eddie Candle 04/03/2015,2:28 AM

## 2015-04-03 NOTE — Progress Notes (Signed)
Subjective: Interval History: has no complaint, feels some better.  Objective: Vital signs in last 24 hours: Temp:  [96.9 F (36.1 C)-98.1 F (36.7 C)] 97.6 F (36.4 C) (06/11 0400) Pulse Rate:  [67-104] 99 (06/11 0500) Resp:  [14-24] 14 (06/11 0500) BP: (104-180)/(81-140) 130/81 mmHg (06/11 0500) SpO2:  [94 %-100 %] 100 % (06/11 0500) Weight:  [58.2 kg (128 lb 4.9 oz)-62.5 kg (137 lb 12.6 oz)] 59.5 kg (131 lb 2.8 oz) (06/11 0400) Weight change:   Intake/Output from previous day: 06/10 0701 - 06/11 0700 In: 22.5 [I.V.:22.5] Out: 3502  Intake/Output this shift:    General appearance: cooperative and sallow, less facial edema Resp: rales bibasilar Chest wall: ICD R  Cardio: S1, S2 normal, S4 present, systolic murmur: holosystolic 2/6, blowing at apex and LV lift GI: soft,pos bs, liver down 7 cm, mod distension Extremities: edema 1+ and AVF LUA  Lab Results:  Recent Labs  04/02/15 2205 04/03/15 0130  WBC 7.1 6.1  HGB 10.8* 10.7*  HCT 33.2* 32.3*  PLT 245 210   BMET:  Recent Labs  04/02/15 2205 04/03/15 0050  NA 135 136  K 6.4* 5.9*  CL 92* 94*  CO2 16* 20*  GLUCOSE 73 83  BUN 130* 138*  CREATININE 17.38* 17.47*  CALCIUM 9.9 9.8   No results for input(s): PTH in the last 72 hours. Iron Studies: No results for input(s): IRON, TIBC, TRANSFERRIN, FERRITIN in the last 72 hours.  Studies/Results: Dg Chest 2 View  04/02/2015   CLINICAL DATA:  Shortness of breath and chest pain  EXAM: CHEST - 2 VIEW  COMPARISON:  03/03/2015  FINDINGS: Cardiac shadow remains enlarged. A pacing device is again seen. The lungs are clear bilaterally. No acute bony abnormality is seen.  IMPRESSION: Stable cardiomegaly without acute abnormality.   Electronically Signed   By: Alcide Clever M.D.   On: 04/02/2015 17:33    I have reviewed the patient's current medications.  Assessment/Plan: 1 ESRD vol and solute better. Clinically better. Needs hd again , slow and short. 2 Anemia  stable 3 CP suspect uremic 4 HTN lower dry, meds 5 ICD 6 NONADHERENCE 7 Fluid xs P HD, bp , meds, counsel    LOS: 1 day   [name] Small L 04/03/2015,7:12 AM

## 2015-04-03 NOTE — Progress Notes (Addendum)
PROGRESS NOTE  [name] Small ZOX:[id number] DOB: [birth year] DOA: 04/02/2015 PCP: No PCP Per Patient  Assessment/Plan: Chest pain: Patient has history of PE 3 years ago. Now presents with pleuritic chest pain and episodic SOB, concerning for PE.  -elevated troponin - prn Nitroglycerin, dilaudid -start lipitor -will not start ASA, since her cardiologist, Dr, Katherina Right in [facility] told her that she should never take ASA, reason is not clear. - check FLP, A1C, TSH - 2d echo - d dimer positive- V/Q scan pending Per Dr.Deterding, it is better to not give patient any contrast for CTA. - LE doppler to r/o DVT   Hyperkalemia: K=7.1.This is due to having missed hemodialysis -treated with calcium, insulin, D50, Kayexalate 45 g in emergency room. -renal was consulted for dialysis.  ESRD-HD: missed dialysis for 1 week. Cre 16.9, BUN 133, potassium 7.1 and bicarbonate 15.  -renal was consulted for dialysis. -continue PhosLo, Sensipar  HTN:  -continue lisinopril, -continue coreg -Hydralazine IV when necessary  Systolic CHF, chronic: 2-D echo 02/22/15 showed EF 35-40%. Clinically patient is dry on admission. No leg edema. - renal helps in volume management.  Chronically Elevated troponin: Trop 0.14 on 03/03/15-->0.63. Etiology is not clear. 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 -trop x 6 -follow up PE workup and 2d ehco  GERD: -Pepcid  Pain in left hip: chronic issue -continue home percocet   Poor insight into disease and complications  Code Status: full Family Communication: patient Disposition Plan:    Consultants:  renal  Procedures:      HPI/Subjective: Patient homeless in [place] and has been dismissed from all dialysis centers in [place] Chest pain is only when laying flat  Objective: Filed Vitals:   04/03/15 0500  BP: 130/81  Pulse: 99  Temp:   Resp: 14    Intake/Output Summary (Last 24 hours) at 04/03/15 0756 Last data filed at 04/03/15 0600  Gross per 24 hour  Intake   22.5 ml  Output   3502 ml  Net -3479.5 ml   Filed Weights   04/02/15 2230 04/03/15 0202 04/03/15 0400  Weight: 61.8 kg (136 lb 3.9 oz) 58.2 kg (128 lb 4.9 oz) 59.5 kg (131 lb 2.8 oz)    Exam:   General:  A+Ox3, NAD  Cardiovascular: rrr  Respiratory: clear  Abdomen: +Bs, soft  Musculoskeletal: no edema    Data Reviewed: Basic Metabolic Panel:  Recent Labs Lab 04/02/15 1740 04/02/15 1833 04/02/15 2205 04/03/15 0050  NA 129*  --  135 136  K 7.1* 6.7* 6.4* 5.9*  CL 93*  --  92* 94*  CO2 15*  --  16* 20*  GLUCOSE 109*  --  73 83  BUN 133*  --  130* 138*  CREATININE 16.91*  --  17.38* 17.47*  CALCIUM 9.5  --  9.9 9.8  MG  --  2.4  --   --   PHOS  --   --  11.2*  --    Liver Function Tests:  Recent Labs Lab 04/02/15 1740 04/02/15 2205  AST 21  --   ALT 9*  --   ALKPHOS 221*  --   BILITOT 0.8  --   PROT 7.7  --   ALBUMIN 3.6 3.6   No results for input(s): LIPASE, AMYLASE in the last 168 hours. No results for input(s): AMMONIA in the last 168 hours. CBC:  Recent Labs Lab 04/02/15 1715 04/02/15 2205 04/03/15 0130  WBC 6.9 7.1 6.1  NEUTROABS 4.4  --   --  HGB 10.7* 10.8* 10.7*  HCT 33.3* 33.2* 32.3*  MCV 88.1 87.1 86.6  PLT 250 245 210   Cardiac Enzymes:  Recent Labs Lab 04/02/15 1710 04/02/15 2205 04/03/15 0050  TROPONINI 0.63* 0.56* 0.59*   BNP (last 3 results) No results for input(s): BNP in the last 8760 hours.  ProBNP (last 3 results) No results for input(s): PROBNP in the last 8760 hours.  CBG: No results for input(s): GLUCAP in the last 168 hours.  Recent Results (from the past 240 hour(s))  MRSA PCR Screening     Status: None   Collection Time: 04/02/15  8:58 PM  Result Value Ref Range Status   MRSA by PCR NEGATIVE NEGATIVE Final    Comment:        The GeneXpert MRSA Assay (FDA approved for NASAL specimens only), is one component of a comprehensive MRSA colonization surveillance program. It  is not intended to diagnose MRSA infection nor to guide or monitor treatment for MRSA infections.      Studies: Dg Chest 2 View  04/02/2015   CLINICAL DATA:  Shortness of breath and chest pain  EXAM: CHEST - 2 VIEW  COMPARISON:  03/03/2015  FINDINGS: Cardiac shadow remains enlarged. A pacing device is again seen. The lungs are clear bilaterally. No acute bony abnormality is seen.  IMPRESSION: Stable cardiomegaly without acute abnormality.   Electronically Signed   By: Alcide Clever M.D.   On: 04/02/2015 17:33    Scheduled Meds: . amLODipine  10 mg Oral QHS  . atorvastatin  40 mg Oral q1800  . calcium acetate  1,334 mg Oral TID WC  . calcium acetate  2,001 mg Oral TID WC  . carvedilol  6.25 mg Oral BID WC  . cinacalcet  30 mg Oral Q supper  . diphenhydrAMINE  50 mg Oral Once  . isosorbide dinitrate  10 mg Oral TID  . lisinopril  2.5 mg Oral BID  . LORazepam  1 mg Oral Q T,Th,Sa-HD  . methylPREDNISolone (SOLU-MEDROL) injection  125 mg Intravenous Once  . multivitamin  1 tablet Oral QHS  . sertraline  100 mg Oral Daily  . sodium chloride  3 mL Intravenous Q12H   Continuous Infusions: . heparin 1,000 Units/hr (04/03/15 0403)   Antibiotics Given (last 72 hours)    None      Principal Problem:   Chest pain Active Problems:   ESRD (end stage renal disease) on dialysis   HTN (hypertension)   PE (pulmonary embolism)   Systolic CHF, chronic   Hyperkalemia   Chronically Elevated troponin   Uremia   GERD (gastroesophageal reflux disease)   Depression   Pain in left hip   Pain in the chest   Left hip pain    Time spent: 35 min    [name] Small  [facility] Pager [phone]. If 7PM-7AM, please contact night-coverage at [email or web address], password [facility] 04/03/2015, 7:56 AM  LOS: 1 day

## 2015-04-04 ENCOUNTER — Encounter (HOSPITAL_COMMUNITY): Payer: Medicare Other

## 2015-04-04 ENCOUNTER — Inpatient Hospital Stay (HOSPITAL_COMMUNITY): Payer: Medicare Other

## 2015-04-04 DIAGNOSIS — R0789 Other chest pain: Secondary | ICD-10-CM

## 2015-04-04 DIAGNOSIS — I5022 Chronic systolic (congestive) heart failure: Secondary | ICD-10-CM

## 2015-04-04 DIAGNOSIS — M7989 Other specified soft tissue disorders: Secondary | ICD-10-CM

## 2015-04-04 LAB — CBC
HCT: 33.3 % — ABNORMAL LOW (ref 36.0–46.0)
Hemoglobin: 10.7 g/dL — ABNORMAL LOW (ref 12.0–15.0)
MCH: 28.1 pg (ref 26.0–34.0)
MCHC: 32.1 g/dL (ref 30.0–36.0)
MCV: 87.4 fL (ref 78.0–100.0)
PLATELETS: 207 10*3/uL (ref 150–400)
RBC: 3.81 MIL/uL — ABNORMAL LOW (ref 3.87–5.11)
RDW: 18.1 % — ABNORMAL HIGH (ref 11.5–15.5)
WBC: 6.2 10*3/uL (ref 4.0–10.5)

## 2015-04-04 LAB — HEPATITIS B SURFACE ANTIGEN: HEP B S AG: NEGATIVE

## 2015-04-04 LAB — HEPARIN LEVEL (UNFRACTIONATED): Heparin Unfractionated: <0.1 IU/mL — ABNORMAL LOW (ref 0.30–0.70)

## 2015-04-04 LAB — T4, FREE: Free T4: 1.19 ng/dL — ABNORMAL HIGH (ref 0.61–1.12)

## 2015-04-04 LAB — HEPATITIS B SURFACE ANTIBODY,QUALITATIVE: Hep B S Ab: REACTIVE

## 2015-04-04 LAB — GLUCOSE, CAPILLARY: Glucose-Capillary: 83 mg/dL (ref 65–99)

## 2015-04-04 LAB — HEPATITIS B CORE ANTIBODY, TOTAL: Hep B Core Total Ab: NEGATIVE

## 2015-04-04 MED ORDER — OXYCODONE-ACETAMINOPHEN 5-325 MG PO TABS
1.0000 | ORAL_TABLET | Freq: Once | ORAL | Status: AC
  Administered 2015-04-04: 1 via ORAL
  Filled 2015-04-04: qty 1

## 2015-04-04 NOTE — Progress Notes (Signed)
*  PRELIMINARY RESULTS* Vascular Ultrasound Lower extremity venous duplex has been completed.  Preliminary findings: negative for DVT  Farrel Demark, RDMS, RVT  04/04/2015, 11:20 AM

## 2015-04-04 NOTE — Evaluation (Signed)
Physical Therapy Evaluation Patient Details Name: [name] Small MRN: [id number] DOB: 05/24/1991 Today's Date: 04/04/2015   History of Present Illness  Patient is a 24 yo female admitted 04/02/15 with chest pain. bil calf tenderness, hyperkalcemia, increased troponin.  VQ scan negative for PE, dopplers negative for DVT.  Patient with pericarditis.  PMH:  ESRD on HD, HTN, CHF, EF 35-40%, ICD, sickle cell anemia, chronic elevated troponin, chronic pain esp hips with L>R  Clinical Impression  Patient is functioning at Mod I level with mobility and gait. No acute PT needs identified - PT will sign off.  Patient would benefit from cane for balance/safety.    Follow Up Recommendations No PT follow up;Supervision - Intermittent    Equipment Recommendations  Cane    Recommendations for Other Services       Precautions / Restrictions Precautions Precautions: None Restrictions Weight Bearing Restrictions: No      Mobility  Bed Mobility Overal bed mobility: Independent                Transfers Overall transfer level: Modified independent Equipment used: None             General transfer comment: Increased time.  Ambulation/Gait Ambulation/Gait assistance: Modified independent (Device/Increase time) Ambulation Distance (Feet): 30 Feet Assistive device: None Gait Pattern/deviations: Step-through pattern;Decreased stride length;Antalgic (Lateral trunk flexion) Gait velocity: Decreased Gait velocity interpretation: Below normal speed for age/gender General Gait Details: Patient with lateral trunk flexion to both sides due to hip pain.  Patient reports feeling unsteady, but no loss of balance.  Stairs            Wheelchair Mobility    Modified Rankin (Stroke Patients Only)       Balance Overall balance assessment: Modified Independent                                           Pertinent Vitals/Pain Pain Assessment: 0-10 Pain Score: 8   Pain Location: Lt hip Pain Descriptors / Indicators: Aching;Sore Pain Intervention(s): Limited activity within patient's tolerance;Repositioned    Home Living Family/patient expects to be discharged to:: Private residence Living Arrangements: Non-relatives/Friends Available Help at Discharge: Family;Friend(s);Available PRN/intermittently Type of Home: House Home Access: Stairs to enter   [employer] of Steps: 3 Home Layout: One level Home Equipment: None      Prior Function Level of Independence: Independent               Hand Dominance        Extremity/Trunk Assessment   Upper Extremity Assessment: Generalized weakness           Lower Extremity Assessment: Generalized weakness         Communication   Communication: No difficulties  Cognition Arousal/Alertness: Awake/alert Behavior During Therapy: WFL for tasks assessed/performed Overall Cognitive Status: Within Functional Limits for tasks assessed                      General Comments      Exercises        Assessment/Plan    PT Assessment Patent does not need any further PT services  PT Diagnosis Abnormality of gait;Generalized weakness;Acute pain   PT Problem List    PT Treatment Interventions     PT Goals (Current goals can be found in the Care Plan section) Acute Rehab PT Goals PT Goal Formulation: All  assessment and education complete, DC therapy    Frequency     Barriers to discharge        Co-evaluation               End of Session   Activity Tolerance: Patient limited by pain;Patient limited by fatigue Patient left: in bed;with call bell/phone within reach (sitting EOB) Nurse Communication: Mobility status         Time: 0940-7680 PT Time Calculation (min) (ACUTE ONLY): 17 min   Charges:   PT Evaluation $Initial PT Evaluation Tier I: 1 Procedure     PT G CodesVena Austria 01-May-2015, 6:24 PM [name]. Renaldo Fiddler, [facility] Acute  Rehab Services Pager [phone]

## 2015-04-04 NOTE — Progress Notes (Signed)
PROGRESS NOTE  Alayja Armas ZOX:[id number] DOB: 10/31/1990 DOA: 04/02/2015 PCP: No PCP Per Patient  Assessment/Plan: Chest pain: The every negative. Heparin stopped. Patient reports feeling better. Will stop Dilaudid. Continue Percocet. -elevated troponin is chronic and likely related to end-stage renal disease/pericarditis -will not start ASA, since her cardiologist, Dr, Katherina Right in [facility] told her that she should never take ASA, reason is not clear. - LE doppler to r/o DVT pending  Hyperkalemia: K=7.1.This is due to having missed hemodialysis Corrected with dialysis. Transfer to MedSurg.  ESRD-HD: missed dialysis for 1 week. Cre 16.9, BUN 133, potassium 7.1 and bicarbonate 15.  -renal was consulted for dialysis. -continue PhosLo, Sensipar  HTN:  -continue lisinopril, -continue coreg -Hydralazine IV when necessary  Systolic CHF, chronic: EF noted to be 25%. Continue dialysis, Coreg, lisinopril.  GERD: -Pepcid  Pain in left hip: chronic issue -continue home percocet   Noncompliance is the main issue here.  Code Status: full Family Communication: patient Disposition Plan: Home tomorrow if okay with nephrology. Patient reports she will go back to [place] at discharge.  Consultants:  renal  Procedures:   HPI/Subjective: Feels better. No chest pain. No leg pain. No dyspnea.  Objective: Filed Vitals:   04/04/15 0735  BP: 114/75  Pulse: 89  Temp: 98.1 F (36.7 C)  Resp: 15    Intake/Output Summary (Last 24 hours) at 04/04/15 0820 Last data filed at 04/03/15 2217  Gross per 24 hour  Intake      3 ml  Output   3500 ml  Net  -3497 ml   Filed Weights   04/03/15 0400 04/03/15 0945 04/03/15 1256  Weight: 59.5 kg (131 lb 2.8 oz) 57.3 kg (126 lb 5.2 oz) 54 kg (119 lb 0.8 oz)    Exam:   General:  A+Ox3, NAD  Cardiovascular: rrr without murmurs gallops rubs  Respiratory: clear without wheezes rhonchi or rales  Abdomen: +Bs, soft nontender  nondistended  Musculoskeletal: no edema no calf tenderness    Data Reviewed: Basic Metabolic Panel:  Recent Labs Lab 04/02/15 1740 04/02/15 1833 04/02/15 2205 04/03/15 0050 04/03/15 1005  NA 129*  --  135 136 136  K 7.1* 6.7* 6.4* 5.9* 4.1  CL 93*  --  92* 94* 95*  CO2 15*  --  16* 20* 20*  GLUCOSE 109*  --  73 83 78  BUN 133*  --  130* 138* 78*  CREATININE 16.91*  --  17.38* 17.47* 12.36*  CALCIUM 9.5  --  9.9 9.8 10.3  MG  --  2.4  --   --   --   PHOS  --   --  11.2*  --  9.8*   Liver Function Tests:  Recent Labs Lab 04/02/15 1740 04/02/15 2205 04/03/15 1005  AST 21  --  22  ALT 9*  --  9*  ALKPHOS 221*  --  214*  BILITOT 0.8  --  1.0  PROT 7.7  --  7.4  ALBUMIN 3.6 3.6 3.4*   No results for input(s): LIPASE, AMYLASE in the last 168 hours. No results for input(s): AMMONIA in the last 168 hours. CBC:  Recent Labs Lab 04/02/15 1715 04/02/15 2205 04/03/15 0130 04/03/15 1005 04/04/15 0300  WBC 6.9 7.1 6.1 6.8 6.2  NEUTROABS 4.4  --   --   --   --   HGB 10.7* 10.8* 10.7* 9.8* 10.7*  HCT 33.3* 33.2* 32.3* 30.0* 33.3*  MCV 88.1 87.1 86.6 86.2 87.4  PLT 250 245  210 198 207   Cardiac Enzymes:  Recent Labs Lab 04/02/15 1710 04/02/15 2205 04/03/15 0050 04/03/15 1005  TROPONINI 0.63* 0.56* 0.59* 0.61*   BNP (last 3 results) No results for input(s): BNP in the last 8760 hours.  ProBNP (last 3 results) No results for input(s): PROBNP in the last 8760 hours.  CBG:  Recent Labs Lab 04/04/15 0816  GLUCAP 83    Recent Results (from the past 240 hour(s))  MRSA PCR Screening     Status: None   Collection Time: 04/02/15  8:58 PM  Result Value Ref Range Status   MRSA by PCR NEGATIVE NEGATIVE Final    Comment:        The GeneXpert MRSA Assay (FDA approved for NASAL specimens only), is one component of a comprehensive MRSA colonization surveillance program. It is not intended to diagnose MRSA infection nor to guide or monitor treatment  for MRSA infections.      Studies: Dg Chest 2 View  04/02/2015   CLINICAL DATA:  Shortness of breath and chest pain  EXAM: CHEST - 2 VIEW  COMPARISON:  03/03/2015  FINDINGS: Cardiac shadow remains enlarged. A pacing device is again seen. The lungs are clear bilaterally. No acute bony abnormality is seen.  IMPRESSION: Stable cardiomegaly without acute abnormality.   Electronically Signed   By: Alcide Clever M.D.   On: 04/02/2015 17:33   Nm Pulmonary Perf And Vent  04/03/2015   CLINICAL DATA:  Chest pain for 4 days located in the right upper chest. Pleuritic. Mild shortness of breath and cough.  EXAM: NUCLEAR MEDICINE VENTILATION - PERFUSION LUNG SCAN  TECHNIQUE: Ventilation images were obtained in multiple projections using inhaled aerosol Tc-35m DTPA. Perfusion images were obtained in multiple projections after intravenous injection of Tc-31m MAA.  RADIOPHARMACEUTICALS:  40 mCi Technetium-41m DTPA aerosol inhalation and 6 mCi Technetium-73m MAA IV  COMPARISON:  Chest radiographs 04/02/2015  FINDINGS: Ventilation: No focal ventilation defect.  Perfusion: No wedge shaped peripheral perfusion defects to suggest acute pulmonary embolism.  IMPRESSION: Negative.  No evidence of pulmonary emboli.   Electronically Signed   By: Sebastian Ache   On: 04/03/2015 09:11   Echo Left ventricle: The cavity size was severely dilated. Wall thickness was normal. Systolic function was severely reduced. The estimated ejection fraction was in the range of 20% to 25%. Diffuse hypokinesis. - Aortic valve: There was mild regurgitation. - Right ventricle: Pacing/AICD wire seen in RV. - Atrial septum: A patent foramen ovale cannot be excluded. - Pericardium, extracardiac: Small to moderate pericardial effusion no obvious tamponade. - Impressions: Somewhat unusual color flow in LA near LAA may represent pulmnary vein.  Impressions:  - Somewhat unusual color flow in LA near LAA may represent  pulmnary vein.  Scheduled Meds: . amLODipine  10 mg Oral QHS  . atorvastatin  40 mg Oral q1800  . calcium acetate  1,334 mg Oral TID WC  . calcium acetate  2,001 mg Oral TID WC  . carvedilol  6.25 mg Oral BID WC  . cinacalcet  30 mg Oral Q supper  . diphenhydrAMINE  50 mg Oral Once  . heparin subcutaneous  5,000 Units Subcutaneous 3 times per day  . isosorbide dinitrate  10 mg Oral TID  . LORazepam  1 mg Oral Q T,Th,Sa-HD  . methylPREDNISolone (SOLU-MEDROL) injection  125 mg Intravenous Once  . multivitamin  1 tablet Oral QHS  . sertraline  100 mg Oral Daily  . sodium chloride  3 mL Intravenous Q12H  Continuous Infusions:   Antibiotics Given (last 72 hours)    None      Principal Problem:   Chest pain Active Problems:   ESRD (end stage renal disease) on dialysis   HTN (hypertension)   PE (pulmonary embolism)   Systolic CHF, chronic   Hyperkalemia   Chronically Elevated troponin   Uremia   GERD (gastroesophageal reflux disease)   Depression   Pain in left hip   Pain in the chest   Left hip pain    Time spent: 35 min    Venna Berberich L  [facility] Pager [phone]. If 7PM-7AM, please contact night-coverage at [email or web address], password [facility] 04/04/2015, 8:20 AM  LOS: 2 days

## 2015-04-04 NOTE — Progress Notes (Signed)
Subjective: Interval History: has no complaint, CP better.  Feels better  .  Objective: Vital signs in last 24 hours: Temp:  [97.6 F (36.4 C)-98.1 F (36.7 C)] 97.6 F (36.4 C) (06/12 0502) Pulse Rate:  [89-100] 93 (06/12 0502) Resp:  [14-25] 19 (06/12 0502) BP: (113-181)/(71-111) 113/81 mmHg (06/12 0502) SpO2:  [90 %-100 %] 97 % (06/12 0502) Weight:  [54 kg (119 lb 0.8 oz)-57.3 kg (126 lb 5.2 oz)] 54 kg (119 lb 0.8 oz) (06/11 1256) Weight change: -2.7 kg (-5 lb 15.2 oz)  Intake/Output from previous day: 06/11 0701 - 06/12 0700 In: 3 [I.V.:3] Out: 3500  Intake/Output this shift:    General appearance: alert, cooperative, no distress and much less edema Resp: diminished breath sounds bilaterally Cardio: S1, S2 normal, systolic murmur: holosystolic 2/6, blowing at apex and friction rub heard LLSB, much less prominent GI: pos bs, liver down 6 cm Extremities: edema 1+ and AVF LUA, ICD  Lab Results:  Recent Labs  04/03/15 1005 04/04/15 0300  WBC 6.8 6.2  HGB 9.8* 10.7*  HCT 30.0* 33.3*  PLT 198 207   BMET:  Recent Labs  04/03/15 0050 04/03/15 1005  NA 136 136  K 5.9* 4.1  CL 94* 95*  CO2 20* 20*  GLUCOSE 83 78  BUN 138* 78*  CREATININE 17.47* 12.36*  CALCIUM 9.8 10.3   No results for input(s): PTH in the last 72 hours. Iron Studies: No results for input(s): IRON, TIBC, TRANSFERRIN, FERRITIN in the last 72 hours.  Studies/Results: Dg Chest 2 View  04/02/2015   CLINICAL DATA:  Shortness of breath and chest pain  EXAM: CHEST - 2 VIEW  COMPARISON:  03/03/2015  FINDINGS: Cardiac shadow remains enlarged. A pacing device is again seen. The lungs are clear bilaterally. No acute bony abnormality is seen.  IMPRESSION: Stable cardiomegaly without acute abnormality.   Electronically Signed   By: Alcide Clever M.D.   On: 04/02/2015 17:33   Nm Pulmonary Perf And Vent  04/03/2015   CLINICAL DATA:  Chest pain for 4 days located in the right upper chest. Pleuritic. Mild  shortness of breath and cough.  EXAM: NUCLEAR MEDICINE VENTILATION - PERFUSION LUNG SCAN  TECHNIQUE: Ventilation images were obtained in multiple projections using inhaled aerosol Tc-5m DTPA. Perfusion images were obtained in multiple projections after intravenous injection of Tc-33m MAA.  RADIOPHARMACEUTICALS:  40 mCi Technetium-83m DTPA aerosol inhalation and 6 mCi Technetium-61m MAA IV  COMPARISON:  Chest radiographs 04/02/2015  FINDINGS: Ventilation: No focal ventilation defect.  Perfusion: No wedge shaped peripheral perfusion defects to suggest acute pulmonary embolism.  IMPRESSION: Negative.  No evidence of pulmonary emboli.   Electronically Signed   By: Sebastian Ache   On: 04/03/2015 09:11    I have reviewed the patient's current medications.  Assessment/Plan: 1 ESRD improving uremia, vol, pericarditis. Will do again in am 2NONADHERENCE  Primary issue.  Plans to go back to [place].  ???if will 3 Anemia 4 HTN lower with lower vol 5 Pericarditis 6 HPT 7 CM worsens when does her acting out P HD, epo, ??dispo    LOS: 2 days   Juliannah Ohmann L 04/04/2015,7:13 AM

## 2015-04-04 NOTE — Progress Notes (Signed)
Pts mother called the unit from home saying that her daughter was visiting in the pts room is a juvenile run away and wanted the unit to call the police and have the visitor removed. RN contacted the [facility] and he spoke to the police in the ED concerning the situation. Police came to the unit, spoke to the mother, pt and visitor. It was decided that the visitor would spend the night. No further incidence to report.

## 2015-04-05 DIAGNOSIS — I10 Essential (primary) hypertension: Secondary | ICD-10-CM

## 2015-04-05 LAB — RENAL FUNCTION PANEL
ALBUMIN: 3 g/dL — AB (ref 3.5–5.0)
Anion gap: 16 — ABNORMAL HIGH (ref 5–15)
BUN: 61 mg/dL — ABNORMAL HIGH (ref 6–20)
CALCIUM: 9.1 mg/dL (ref 8.9–10.3)
CO2: 23 mmol/L (ref 22–32)
Chloride: 99 mmol/L — ABNORMAL LOW (ref 101–111)
Creatinine, Ser: 11.19 mg/dL — ABNORMAL HIGH (ref 0.44–1.00)
GFR, EST AFRICAN AMERICAN: 5 mL/min — AB (ref >60)
GFR, EST NON AFRICAN AMERICAN: 4 mL/min — AB (ref >60)
Glucose, Bld: 69 mg/dL (ref 65–99)
PHOSPHORUS: 8.5 mg/dL — AB (ref 2.5–4.6)
Potassium: 4.1 mmol/L (ref 3.5–5.1)
SODIUM: 138 mmol/L (ref 135–145)

## 2015-04-05 LAB — COMPREHENSIVE METABOLIC PANEL
ALT: 9 U/L — ABNORMAL LOW (ref 14–54)
ANION GAP: 16 — AB (ref 5–15)
AST: 21 U/L (ref 15–41)
Albumin: 3.1 g/dL — ABNORMAL LOW (ref 3.5–5.0)
Alkaline Phosphatase: 219 U/L — ABNORMAL HIGH (ref 38–126)
BUN: 61 mg/dL — ABNORMAL HIGH (ref 6–20)
CALCIUM: 9.1 mg/dL (ref 8.9–10.3)
CHLORIDE: 100 mmol/L — AB (ref 101–111)
CO2: 23 mmol/L (ref 22–32)
Creatinine, Ser: 11.3 mg/dL — ABNORMAL HIGH (ref 0.44–1.00)
GFR calc Af Amer: 5 mL/min — ABNORMAL LOW (ref >60)
GFR calc non Af Amer: 4 mL/min — ABNORMAL LOW (ref >60)
GLUCOSE: 70 mg/dL (ref 65–99)
Potassium: 4.2 mmol/L (ref 3.5–5.1)
SODIUM: 139 mmol/L (ref 135–145)
Total Bilirubin: 0.8 mg/dL (ref 0.3–1.2)
Total Protein: 7.1 g/dL (ref 6.5–8.1)

## 2015-04-05 LAB — HEMOGLOBIN A1C
Hgb A1c MFr Bld: 5.8 % — ABNORMAL HIGH (ref 4.8–5.6)
MEAN PLASMA GLUCOSE: 120 mg/dL

## 2015-04-05 LAB — CBC
HCT: 31 % — ABNORMAL LOW (ref 36.0–46.0)
Hemoglobin: 9.8 g/dL — ABNORMAL LOW (ref 12.0–15.0)
MCH: 27.8 pg (ref 26.0–34.0)
MCHC: 31.6 g/dL (ref 30.0–36.0)
MCV: 87.8 fL (ref 78.0–100.0)
PLATELETS: 182 10*3/uL (ref 150–400)
RBC: 3.53 MIL/uL — ABNORMAL LOW (ref 3.87–5.11)
RDW: 18.3 % — AB (ref 11.5–15.5)
WBC: 6.1 10*3/uL (ref 4.0–10.5)

## 2015-04-05 LAB — T3, FREE: T3, Free: 2.4 pg/mL (ref 2.0–4.4)

## 2015-04-05 LAB — GLUCOSE, CAPILLARY: GLUCOSE-CAPILLARY: 75 mg/dL (ref 65–99)

## 2015-04-05 MED ORDER — SODIUM CHLORIDE 0.9 % IV SOLN: 100.0000 mL | INTRAVENOUS | Status: DC | PRN

## 2015-04-05 MED ORDER — PENTAFLUOROPROP-TETRAFLUOROETH EX AERO: 1.0000 "application " | INHALATION_SPRAY | CUTANEOUS | Status: DC | PRN

## 2015-04-05 MED ORDER — NEPRO/CARBSTEADY PO LIQD
237.0000 mL | ORAL | Status: DC | PRN
  Filled 2015-04-05: qty 237

## 2015-04-05 MED ORDER — ALTEPLASE 2 MG IJ SOLR
2.0000 mg | Freq: Once | INTRAMUSCULAR | Status: DC | PRN
  Filled 2015-04-05: qty 2

## 2015-04-05 MED ORDER — HEPARIN SODIUM (PORCINE) 1000 UNIT/ML DIALYSIS: 100.0000 [IU]/kg | INTRAMUSCULAR | Status: DC | PRN

## 2015-04-05 MED ORDER — AMLODIPINE BESYLATE 10 MG PO TABS: 10.0000 mg | ORAL_TABLET | Freq: Every day | ORAL | Status: DC

## 2015-04-05 MED ORDER — LORAZEPAM 0.5 MG PO TABS
0.5000 mg | ORAL_TABLET | Freq: Once | ORAL | Status: AC | PRN
  Administered 2015-04-05: 0.5 mg via ORAL
  Filled 2015-04-05: qty 1

## 2015-04-05 MED ORDER — LIDOCAINE-PRILOCAINE 2.5-2.5 % EX CREA
1.0000 "application " | TOPICAL_CREAM | CUTANEOUS | Status: DC | PRN
  Filled 2015-04-05: qty 5

## 2015-04-05 MED ORDER — HEPARIN SODIUM (PORCINE) 1000 UNIT/ML DIALYSIS: 1000.0000 [IU] | INTRAMUSCULAR | Status: DC | PRN

## 2015-04-05 MED ORDER — LIDOCAINE HCL (PF) 1 % IJ SOLN: 5.0000 mL | INTRAMUSCULAR | Status: DC | PRN

## 2015-04-05 NOTE — Care Management Note (Signed)
Case Management Note  Patient Details  Name: [name] Small MRN: [id number] Date of Birth: [birth year]  Subjective/Objective:              CM following for progression and d/c planning.      Action/Plan: No d/c needs identified.   Expected Discharge Date:   04/06/2015               Expected Discharge Plan:  Home/Self Care  In-House Referral:  NA  Discharge planning Services  NA  Post Acute Care Choice:  NA Choice offered to:  NA  DME Arranged:    DME Agency:     HH Arranged:    HH Agency:     Status of Service:  Completed, signed off  Medicare Important Message Given:  Yes Date Medicare IM Given:  04/05/15 Medicare IM give by:  Johny Shock RN MPH, case manager, [phone] Date Additional Medicare IM Given:    Additional Medicare Important Message give by:     If discussed at Long Length of Stay Meetings, dates discussed:    Additional Comments:  Starlyn Skeans, RN 04/05/2015, 4:10 PM

## 2015-04-05 NOTE — Progress Notes (Signed)
Subjective: Interval History: has no complaint, CP better.  Feels better  Objective: Vital signs in last 24 hours: Temp:  [97.2 F (36.2 C)-98.8 F (37.1 C)] 97.4 F (36.3 C) (06/13 0930) Pulse Rate:  [80-87] 87 (06/13 0931) Resp:  [10-20] 16 (06/13 0931) BP: (114-141)/(73-96) 138/86 mmHg (06/13 0931) SpO2:  [96 %-100 %] 98 % (06/13 0930) Weight:  [56.1 kg (123 lb 10.9 oz)] 56.1 kg (123 lb 10.9 oz) (06/13 0930) Weight change:   Intake/Output from previous day: 06/12 0701 - 06/13 0700 In: 963 [P.O.:960; I.V.:3] Out: 0  Intake/Output this shift: Total I/O In: 240 [P.O.:240] Out: -   General appearance: alert, cooperative, no distress and much less edema Resp: diminished breath sounds bilaterally Cardio: S1, S2 normal, systolic murmur: holosystolic 2/6, blowing at apex and friction rub heard LLSB, much less prominent GI: pos bs, liver down 6 cm Extremities: edema 1+ and AVF LUA, ICD  Lab Results:  Recent Labs  04/03/15 1005 04/04/15 0300  WBC 6.8 6.2  HGB 9.8* 10.7*  HCT 30.0* 33.3*  PLT 198 207   BMET:   Recent Labs  04/03/15 0050 04/03/15 1005  NA 136 136  K 5.9* 4.1  CL 94* 95*  CO2 20* 20*  GLUCOSE 83 78  BUN 138* 78*  CREATININE 17.47* 12.36*  CALCIUM 9.8 10.3   No results for input(s): PTH in the last 72 hours. Iron Studies: No results for input(s): IRON, TIBC, TRANSFERRIN, FERRITIN in the last 72 hours.  Studies/Results: No results found.  I have reviewed the patient's current medications.  Assessment/Plan: 1 ESRD improving uremia, vol, pericarditis resolved. HD today 2 NONADHERENCE  Primary issue.  Plans to go back to [place] for OP HD. 3 Anemia 4 HTN lower with lower vol 5 Sec HPT very high PTH in March ~2500, was told to have parathyroidectomy by [facility] MD's but declined, now is reconsidering due to bony pain (hips, collarbones). She was told it was "osteomyelitis" but she prob means osteonecrosis.  6 HPT 7 CM worsens when does her  acting out  P HD, epo, check 25 D level, ok for dc from out standpoint. We will work w [facility] unit to help get her set up for parathyroidectomy here in [place] if possible   LOS: 3 days   Elysia Grand D 04/05/2015,10:03 AM

## 2015-04-05 NOTE — Discharge Summary (Signed)
Physician Discharge Summary  [name] Small ZOX:[id number] DOB: 1991-08-21 DOA: 04/02/2015  PCP: No PCP Per Patient  Admit date: 04/02/2015 Discharge date: 04/05/2015  Time spent: greater than 30 minutes  Recommendations for Outpatient Follow-up:  1.   Discharge Diagnoses:  Principal Problem:   Chest pain Active Problems:   ESRD (end stage renal disease) on dialysis   HTN (hypertension)   PE (pulmonary embolism)   Systolic CHF, chronic   Hyperkalemia   Chronically Elevated troponin   Uremia   GERD (gastroesophageal reflux disease)   Depression   Pain in left hip   Pain in the chest   Left hip pain   Discharge Condition: stable  Diet recommendation: renal  Filed Weights   04/03/15 0945 04/03/15 1256 04/05/15 0930  Weight: 57.3 kg (126 lb 5.2 oz) 54 kg (119 lb 0.8 oz) 56.1 kg (123 lb 10.9 oz)    History of present illness:  24 y.o. female with PMH of hypertension, systolic congestive heart failure (EF 35-40%), GERD, depression, history of polycystic kidney disease, failed kidney transplantation 2008, ESRD-HD, ICD placement 12/2014, ESRD-HD, history of pulmonary embolism 3 years ago, sickle cell anemia, chronic pain, chronic elevation of troponin, who presents with chest pain.  Patient reports that she has been having chest pain in the past 3 days. Chest pain is located in the right upper chest, constantly, 8 out of 10 in severity, sharp, nonradiating. It is pieuritic, and aggravated by deep breath. It is associated with mild shortness of breath and mild dry cough. She had several episode of shortness of breath, but no shortness currently. She also reports tenderness over calf areas bilaterally for 3 days. No history of long distance traveling recently. She has chronic left hip pain, which has not changed. She reports that she missed dialysis for 1 week. She sees nephrologist in [place] and is here for visiting family.  Currently patient denies fever, chills, running nose, ear  pain, headaches, abdominal pain, diarrhea, constipation, dysuria, urgency, frequency, hematuria, skin rashes or leg swelling. No unilateral weakness, numbness or tingling sensations. No vision change or hearing loss.  In ED, patient was found to have potassium 7.1, creatinine 6.9, BUN when 33, elevated troponin 0.63 (previous troponin was 0.14 on 03/03/15), WBC 6.9, temperature normal, tachycardia, negative chest x-ray for acute abnormalities. Patient is admitted to inpatient for further evaluation and treatment. Renal was consulted by ED.  Hospital Course:  Chest pain: VQ negative. Improved after dialysis -elevated troponin is chronic and likely related to end-stage renal disease/pericarditis -will not start ASA, since her cardiologist, Dr, Katherina Right in [place] [facility] told her that she should never take ASA, reason is not clear. - LE doppler negative  Hyperkalemia: K=7.1. Corrected with dialysis.   ESRD-HD: missed dialysis for 1 week. Cre 16.9, BUN 133, potassium 7.1 and bicarbonate 15.  -renal was consulted for dialysis. -continue PhosLo, Sensipar  HTN:  Lisinopril held -continue coreg -Hydralazine IV when necessary  Systolic CHF, chronic: EF noted to be 25%. Continue dialysis, Coreg, lisinopril.  GERD: -Pepcid  Pain in left hip: chronic issue -continue home percocet   Noncompliance is the main issue   Procedures: HD Consultations:  neurology  Discharge Exam: Filed Vitals:   04/05/15 1100  BP: 124/103  Pulse: 92  Temp:   Resp:     General: comfortable on dialysis Cardiovascular: RRR Respiratory: CTA  Discharge Instructions   Discharge Instructions    Activity as tolerated - No restrictions    Complete by:  As directed  Discharge instructions    Complete by:  As directed   Renal diet. Do not miss dialysis          Current Discharge Medication List    START taking these medications   Details  amLODipine (NORVASC) 10 MG tablet Take 1 tablet (10 mg  total) by mouth at bedtime. Qty: 30 tablet, Refills: 1      CONTINUE these medications which have NOT CHANGED   Details  carvedilol (COREG) 6.25 MG tablet Take 1 tablet (6.25 mg total) by mouth 2 (two) times daily. Qty: 60 tablet, Refills: 0    isosorbide dinitrate (ISORDIL) 10 MG tablet Take 1 tablet (10 mg total) by mouth 3 (three) times daily. Qty: 60 tablet, Refills: 0    LORazepam (ATIVAN) 1 MG tablet Take 1 tablet (1 mg total) by mouth See admin instructions. On dialysis days, before dialysis Qty: 12 tablet, Refills: 0    oxyCODONE (OXY IR/ROXICODONE) 5 MG immediate release tablet Take 5 mg by mouth 3 (three) times daily as needed. For pain    oxyCODONE-acetaminophen (PERCOCET/ROXICET) 5-325 MG per tablet Take 1 tablet by mouth every 8 (eight) hours as needed for severe pain. Qty: 10 tablet, Refills: 0    sertraline (ZOLOFT) 100 MG tablet Take 1 tablet (100 mg total) by mouth daily. Qty: 20 tablet, Refills: 0    sevelamer carbonate (RENVELA) 800 MG tablet Take 2,400 mg by mouth 3 (three) times daily with meals.    cinacalcet (SENSIPAR) 30 MG tablet Take 1 tablet (30 mg total) by mouth daily with supper. Qty: 60 tablet, Refills: 0      STOP taking these medications     lisinopril (PRINIVIL,ZESTRIL) 2.5 MG tablet      famotidine (PEPCID) 20 MG tablet        Allergies  Allergen Reactions  . Tramadol Anaphylaxis  . Iohexol Itching  . Morphine And Related Itching    Ok with oxycodone  . Vicodin [Hydrocodone-Acetaminophen] Hives      The results of significant diagnostics from this hospitalization (including imaging, microbiology, ancillary and laboratory) are listed below for reference.    Significant Diagnostic Studies: Dg Chest 2 View  04/02/2015   CLINICAL DATA:  Shortness of breath and chest pain  EXAM: CHEST - 2 VIEW  COMPARISON:  03/03/2015  FINDINGS: Cardiac shadow remains enlarged. A pacing device is again seen. The lungs are clear bilaterally. No acute  bony abnormality is seen.  IMPRESSION: Stable cardiomegaly without acute abnormality.   Electronically Signed   By: Alcide Clever M.D.   On: 04/02/2015 17:33   Nm Pulmonary Perf And Vent  04/03/2015   CLINICAL DATA:  Chest pain for 4 days located in the right upper chest. Pleuritic. Mild shortness of breath and cough.  EXAM: NUCLEAR MEDICINE VENTILATION - PERFUSION LUNG SCAN  TECHNIQUE: Ventilation images were obtained in multiple projections using inhaled aerosol Tc-59m DTPA. Perfusion images were obtained in multiple projections after intravenous injection of Tc-85m MAA.  RADIOPHARMACEUTICALS:  40 mCi Technetium-34m DTPA aerosol inhalation and 6 mCi Technetium-67m MAA IV  COMPARISON:  Chest radiographs 04/02/2015  FINDINGS: Ventilation: No focal ventilation defect.  Perfusion: No wedge shaped peripheral perfusion defects to suggest acute pulmonary embolism.  IMPRESSION: Negative.  No evidence of pulmonary emboli.   Electronically Signed   By: Sebastian Ache   On: 04/03/2015 09:11    Microbiology: Recent Results (from the past 240 hour(s))  MRSA PCR Screening     Status: None   Collection Time:  04/02/15  8:58 PM  Result Value Ref Range Status   MRSA by PCR NEGATIVE NEGATIVE Final    Comment:        The GeneXpert MRSA Assay (FDA approved for NASAL specimens only), is one component of a comprehensive MRSA colonization surveillance program. It is not intended to diagnose MRSA infection nor to guide or monitor treatment for MRSA infections.      Labs: Basic Metabolic Panel:  Recent Labs Lab 04/02/15 1740 04/02/15 1833 04/02/15 2205 04/03/15 0050 04/03/15 1005 04/05/15 1045  NA 129*  --  135 136 136 139  138  K 7.1* 6.7* 6.4* 5.9* 4.1 4.2  4.1  CL 93*  --  92* 94* 95* 100*  99*  CO2 15*  --  16* 20* 20* 23  23  GLUCOSE 109*  --  73 83 78 70  69  BUN 133*  --  130* 138* 78* 61*  61*  CREATININE 16.91*  --  17.38* 17.47* 12.36* 11.30*  11.19*  CALCIUM 9.5  --  9.9 9.8 10.3  9.1  9.1  MG  --  2.4  --   --   --   --   PHOS  --   --  11.2*  --  9.8* 8.5*   Liver Function Tests:  Recent Labs Lab 04/02/15 1740 04/02/15 2205 04/03/15 1005 04/05/15 1045  AST 21  --  22 21  ALT 9*  --  9* 9*  ALKPHOS 221*  --  214* 219*  BILITOT 0.8  --  1.0 0.8  PROT 7.7  --  7.4 7.1  ALBUMIN 3.6 3.6 3.4* 3.1*  3.0*   No results for input(s): LIPASE, AMYLASE in the last 168 hours. No results for input(s): AMMONIA in the last 168 hours. CBC:  Recent Labs Lab 04/02/15 1715 04/02/15 2205 04/03/15 0130 04/03/15 1005 04/04/15 0300 04/05/15 1045  WBC 6.9 7.1 6.1 6.8 6.2 6.1  NEUTROABS 4.4  --   --   --   --   --   HGB 10.7* 10.8* 10.7* 9.8* 10.7* 9.8*  HCT 33.3* 33.2* 32.3* 30.0* 33.3* 31.0*  MCV 88.1 87.1 86.6 86.2 87.4 87.8  PLT 250 245 210 198 207 182   Cardiac Enzymes:  Recent Labs Lab 04/02/15 1710 04/02/15 2205 04/03/15 0050 04/03/15 1005  TROPONINI 0.63* 0.56* 0.59* 0.61*   BNP: BNP (last 3 results) No results for input(s): BNP in the last 8760 hours.  ProBNP (last 3 results) No results for input(s): PROBNP in the last 8760 hours.  CBG:  Recent Labs Lab 04/04/15 0816 04/05/15 0743  GLUCAP 83 75       Signed:  Jaleya Pebley L  [facility] 04/05/2015, 11:40 AM

## 2015-04-05 NOTE — Progress Notes (Signed)
[name] Small to be D/C'd Home per MD order.  Discussed prescriptions and follow up appointments with the patient. Prescriptions given to patient, medication list explained in detail. Pt verbalized understanding.    Medication List    STOP taking these medications        lisinopril 2.5 MG tablet  Commonly known as:  PRINIVIL,ZESTRIL      TAKE these medications        amLODipine 10 MG tablet  Commonly known as:  NORVASC  Take 1 tablet (10 mg total) by mouth at bedtime.     carvedilol 6.25 MG tablet  Commonly known as:  COREG  Take 1 tablet (6.25 mg total) by mouth 2 (two) times daily.     cinacalcet 30 MG tablet  Commonly known as:  SENSIPAR  Take 1 tablet (30 mg total) by mouth daily with supper.     isosorbide dinitrate 10 MG tablet  Commonly known as:  ISORDIL  Take 1 tablet (10 mg total) by mouth 3 (three) times daily.     LORazepam 1 MG tablet  Commonly known as:  ATIVAN  Take 1 tablet (1 mg total) by mouth See admin instructions. On dialysis days, before dialysis     oxyCODONE 5 MG immediate release tablet  Commonly known as:  Oxy IR/ROXICODONE  Take 5 mg by mouth 3 (three) times daily as needed. For pain     oxyCODONE-acetaminophen 5-325 MG per tablet  Commonly known as:  PERCOCET/ROXICET  Take 1 tablet by mouth every 8 (eight) hours as needed for severe pain.     sertraline 100 MG tablet  Commonly known as:  ZOLOFT  Take 1 tablet (100 mg total) by mouth daily.     sevelamer carbonate 800 MG tablet  Commonly known as:  RENVELA  Take 2,400 mg by mouth 3 (three) times daily with meals.        Filed Vitals:   04/05/15 1412  BP: 134/96  Pulse: 89  Temp:   Resp: 18    Skin clean, dry and intact without evidence of skin break down, no evidence of skin tears noted. IV catheter discontinued intact. Site without signs and symptoms of complications. Dressing and pressure applied. Pt denies pain at this time. No complaints noted.  An After Visit Summary was  printed and given to the patient. Patient escorted out.  Derwin Reddy A 04/05/2015 4:58 PM

## 2015-06-24 DIAGNOSIS — S329XXA Fracture of unspecified parts of lumbosacral spine and pelvis, initial encounter for closed fracture: Secondary | ICD-10-CM

## 2015-06-24 HISTORY — DX: Fracture of unspecified parts of lumbosacral spine and pelvis, initial encounter for closed fracture: S32.9XXA

## 2015-06-29 ENCOUNTER — Emergency Department (HOSPITAL_BASED_OUTPATIENT_CLINIC_OR_DEPARTMENT_OTHER)
Admission: EM | Admit: 2015-06-29 | Discharge: 2015-06-29 | Disposition: A | Discharge status: Other Acute Inpatient Hospital | Payer: Medicare Other | Attending: Emergency Medicine | Admitting: Emergency Medicine

## 2015-06-29 ENCOUNTER — Emergency Department (HOSPITAL_BASED_OUTPATIENT_CLINIC_OR_DEPARTMENT_OTHER): Payer: Medicare Other

## 2015-06-29 ENCOUNTER — Encounter (HOSPITAL_BASED_OUTPATIENT_CLINIC_OR_DEPARTMENT_OTHER): Payer: Self-pay | Admitting: Emergency Medicine

## 2015-06-29 DIAGNOSIS — Z992 Dependence on renal dialysis: Secondary | ICD-10-CM

## 2015-06-29 DIAGNOSIS — R079 Chest pain, unspecified: Secondary | ICD-10-CM | POA: Diagnosis present

## 2015-06-29 DIAGNOSIS — Z8719 Personal history of other diseases of the digestive system: Secondary | ICD-10-CM | POA: Diagnosis not present

## 2015-06-29 DIAGNOSIS — R0602 Shortness of breath: Secondary | ICD-10-CM | POA: Insufficient documentation

## 2015-06-29 DIAGNOSIS — Z86711 Personal history of pulmonary embolism: Secondary | ICD-10-CM | POA: Diagnosis not present

## 2015-06-29 DIAGNOSIS — Z79899 Other long term (current) drug therapy: Secondary | ICD-10-CM | POA: Insufficient documentation

## 2015-06-29 DIAGNOSIS — Z720 Tobacco use: Secondary | ICD-10-CM | POA: Insufficient documentation

## 2015-06-29 DIAGNOSIS — E875 Hyperkalemia: Secondary | ICD-10-CM | POA: Diagnosis not present

## 2015-06-29 DIAGNOSIS — I509 Heart failure, unspecified: Secondary | ICD-10-CM | POA: Insufficient documentation

## 2015-06-29 DIAGNOSIS — Z862 Personal history of diseases of the blood and blood-forming organs and certain disorders involving the immune mechanism: Secondary | ICD-10-CM | POA: Insufficient documentation

## 2015-06-29 DIAGNOSIS — N186 End stage renal disease: Secondary | ICD-10-CM | POA: Insufficient documentation

## 2015-06-29 DIAGNOSIS — I120 Hypertensive chronic kidney disease with stage 5 chronic kidney disease or end stage renal disease: Secondary | ICD-10-CM | POA: Insufficient documentation

## 2015-06-29 DIAGNOSIS — R0789 Other chest pain: Secondary | ICD-10-CM

## 2015-06-29 DIAGNOSIS — R000 Tachycardia, unspecified: Secondary | ICD-10-CM | POA: Diagnosis not present

## 2015-06-29 DIAGNOSIS — R072 Precordial pain: Secondary | ICD-10-CM | POA: Insufficient documentation

## 2015-06-29 DIAGNOSIS — G8929 Other chronic pain: Secondary | ICD-10-CM | POA: Diagnosis not present

## 2015-06-29 DIAGNOSIS — R109 Unspecified abdominal pain: Secondary | ICD-10-CM | POA: Diagnosis not present

## 2015-06-29 DIAGNOSIS — Q613 Polycystic kidney, unspecified: Secondary | ICD-10-CM | POA: Diagnosis not present

## 2015-06-29 DIAGNOSIS — Z872 Personal history of diseases of the skin and subcutaneous tissue: Secondary | ICD-10-CM | POA: Diagnosis not present

## 2015-06-29 LAB — CBC WITH DIFFERENTIAL/PLATELET
BASOS ABS: 0 10*3/uL (ref 0.0–0.1)
Basophils Relative: 1 % (ref 0–1)
EOS ABS: 0.1 10*3/uL (ref 0.0–0.7)
Eosinophils Relative: 2 % (ref 0–5)
HCT: 33.1 % — ABNORMAL LOW (ref 36.0–46.0)
HEMOGLOBIN: 10.4 g/dL — AB (ref 12.0–15.0)
Lymphocytes Relative: 24 % (ref 12–46)
Lymphs Abs: 1.4 10*3/uL (ref 0.7–4.0)
MCH: 28.3 pg (ref 26.0–34.0)
MCHC: 31.4 g/dL (ref 30.0–36.0)
MCV: 89.9 fL (ref 78.0–100.0)
Monocytes Absolute: 0.4 10*3/uL (ref 0.1–1.0)
Monocytes Relative: 7 % (ref 3–12)
NEUTROS PCT: 68 % (ref 43–77)
Neutro Abs: 4 10*3/uL (ref 1.7–7.7)
Platelets: 276 10*3/uL (ref 150–400)
RBC: 3.68 MIL/uL — AB (ref 3.87–5.11)
RDW: 19.4 % — ABNORMAL HIGH (ref 11.5–15.5)
WBC: 6 10*3/uL (ref 4.0–10.5)

## 2015-06-29 LAB — BASIC METABOLIC PANEL
ANION GAP: 20 — AB (ref 5–15)
BUN: 123 mg/dL — AB (ref 6–20)
CO2: 19 mmol/L — ABNORMAL LOW (ref 22–32)
Calcium: 9.8 mg/dL (ref 8.9–10.3)
Chloride: 98 mmol/L — ABNORMAL LOW (ref 101–111)
Creatinine, Ser: 13.42 mg/dL — ABNORMAL HIGH (ref 0.44–1.00)
GFR calc Af Amer: 4 mL/min — ABNORMAL LOW (ref >60)
GFR, EST NON AFRICAN AMERICAN: 3 mL/min — AB (ref >60)
Glucose, Bld: 88 mg/dL (ref 65–99)
POTASSIUM: 7.1 mmol/L — AB (ref 3.5–5.1)
SODIUM: 137 mmol/L (ref 135–145)

## 2015-06-29 LAB — TROPONIN I: Troponin I: 0.32 ng/mL — ABNORMAL HIGH (ref <0.031)

## 2015-06-29 LAB — BRAIN NATRIURETIC PEPTIDE

## 2015-06-29 LAB — CBG MONITORING, ED
GLUCOSE-CAPILLARY: 35 mg/dL — AB (ref 65–99)
Glucose-Capillary: 108 mg/dL — ABNORMAL HIGH (ref 65–99)

## 2015-06-29 LAB — PROTIME-INR
INR: 1.51 — AB (ref 0.00–1.49)
PROTHROMBIN TIME: 18.3 s — AB (ref 11.6–15.2)

## 2015-06-29 MED ORDER — CALCIUM GLUCONATE 10 % IV SOLN
1.0000 g | Freq: Once | INTRAVENOUS | Status: AC
  Administered 2015-06-29: 1 g via INTRAVENOUS
  Filled 2015-06-29: qty 10

## 2015-06-29 MED ORDER — SODIUM BICARBONATE 8.4 % IV SOLN
50.0000 meq | Freq: Once | INTRAVENOUS | Status: AC
  Administered 2015-06-29: 50 meq via INTRAVENOUS
  Filled 2015-06-29: qty 50

## 2015-06-29 MED ORDER — SODIUM BICARBONATE 8.4 % IV SOLN: Freq: Once | INTRAVENOUS | Status: DC

## 2015-06-29 MED ORDER — DIPHENHYDRAMINE HCL 50 MG/ML IJ SOLN
25.0000 mg | Freq: Once | INTRAMUSCULAR | Status: AC
  Administered 2015-06-29: 25 mg via INTRAVENOUS
  Filled 2015-06-29: qty 1

## 2015-06-29 MED ORDER — DEXTROSE 50 % IV SOLN
1.0000 | Freq: Once | INTRAVENOUS | Status: AC
  Administered 2015-06-29: 50 mL via INTRAVENOUS

## 2015-06-29 MED ORDER — DEXTROSE 10 % IV SOLN: Freq: Once | INTRAVENOUS | Status: DC

## 2015-06-29 MED ORDER — INSULIN ASPART 100 UNIT/ML IV SOLN
10.0000 [IU] | Freq: Once | INTRAVENOUS | Status: AC
  Administered 2015-06-29: 10 [IU] via INTRAVENOUS
  Filled 2015-06-29: qty 1

## 2015-06-29 MED ORDER — HYDROMORPHONE HCL 1 MG/ML IJ SOLN
1.0000 mg | Freq: Once | INTRAMUSCULAR | Status: AC
  Administered 2015-06-29: 1 mg via INTRAVENOUS
  Filled 2015-06-29: qty 1

## 2015-06-29 NOTE — ED Notes (Signed)
Patient transported to X-ray 

## 2015-06-29 NOTE — ED Notes (Signed)
CBG read 21 Teri RN notified patient given 2 orange juices and graham crackers and peanut butter.

## 2015-06-29 NOTE — ED Notes (Signed)
Pt transferred to [facility] ED via [facility] with all belongings

## 2015-06-29 NOTE — ED Provider Notes (Signed)
CSN: [id number]     Arrival date & time 06/29/15  1523 History   First MD Initiated Contact with Patient 06/29/15 1543     Chief Complaint  Patient presents with  . Chest Pain     (Consider location/radiation/quality/duration/timing/severity/associated sxs/prior Treatment) HPI Comments: 24 year old female presenting with midsternal to right sided chest pain beginning last night. Pain gradual onset, constant, nonradiating rated 8/10, worse when laying flat. Admits to associated shortness of breath, worse with laying flat. Reports having chest pain like this in the past. Pain does not increase with deep inspiration. Denies wheezing, cough, fever or chills. Denies nausea, vomiting. Admits to abdominal distention which she relates to needing dialysis. Dialyzes Tuesday, Thursday and Saturday in [place] and has missed the last 2 dialysis sessions. She is here visiting her parents. History of PE and was on anticoagulation for 6 months which was 3 years ago.  Patient is a 24 y.o. female presenting with chest pain. The history is provided by the patient.  Chest Pain Associated symptoms: shortness of breath     Past Medical History  Diagnosis Date  . Hemodialysis patient   . Hypertension   . Pulmonary emboli 01/2012    Bilateral, moderate clot burden, areas of pulmonary infarction and central necrosis  . CHF (congestive heart failure)   . Cardiomyopathy   . Dysrhythmia     at times per pt.  . Anemia   . H/O transfusion of packed red blood cells   . 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 Polycystic kidney disease   . Cellulitis and abscess of face 03/22/2013  . Renal insufficiency   . Sickle cell anemia   . Chronic pain   . GERD (gastroesophageal reflux disease)   . Depression    Past Surgical History  Procedure Laterality Date  . Nephrectomy    . Av fistula placement    . Kidney  transplant  2008    failed  . Tonsillectomy      as a child.  . Adenoidectomy    . Incision and drainage abscess Right 03/21/2013    Procedure: INCISION AND DRAINAGE RIGHT CHEEK ABSCESS REMOVAL OF FOREIGN BODY;  Surgeon: Serena Colonel, MD;  Location: [facility] OR;  Service: ENT;  Laterality: Right;  . Implantable cardioverter defibrillator implant Right 12/2014   Family History  Problem Relation Age of Onset  . Polycystic kidney disease Father    Social History  Substance Use Topics  . Smoking status: Current Every Day Smoker -- 0.20 packs/day    Types: Cigarettes  . Smokeless tobacco: Never Used  . Alcohol Use: No   OB History    No data available     Review of Systems  Respiratory: Positive for shortness of breath.   Cardiovascular: Positive for chest pain.  Gastrointestinal: Positive for abdominal distention.  All other systems reviewed and are negative.     Allergies  Tramadol; Iohexol; Morphine and related; and Vicodin  Home Medications   Prior to Admission medications   Medication Sig Start Date End Date Taking? Authorizing Provider  amLODipine (NORVASC) 10 MG tablet Take 1 tablet (10 mg total) by mouth at bedtime. 04/05/15  Yes Christiane Ha, MD  carvedilol (COREG) 6.25 MG tablet Take 1 tablet (6.25 mg total) by mouth 2 (two) times daily. 03/04/15  Yes Starleen Arms, MD  cinacalcet (SENSIPAR) 30 MG tablet Take 1 tablet (30 mg total)  by mouth daily with supper. 03/04/15  Yes Starleen Arms, MD  isosorbide dinitrate (ISORDIL) 10 MG tablet Take 1 tablet (10 mg total) by mouth 3 (three) times daily. 03/04/15  Yes Leana Roe Elgergawy, MD  LORazepam (ATIVAN) 1 MG tablet Take 1 tablet (1 mg total) by mouth See admin instructions. On dialysis days, before dialysis Patient taking differently: Take 1 mg by mouth daily.  01/29/15  Yes Joseph Art, DO  oxyCODONE-acetaminophen (PERCOCET/ROXICET) 5-325 MG per tablet Take 1 tablet by mouth every 8 (eight) hours as needed for  severe pain. 02/25/15  Yes Zannie Cove, MD  oxyCODONE (OXY IR/ROXICODONE) 5 MG immediate release tablet Take 5 mg by mouth 3 (three) times daily as needed. For pain 03/11/15   Historical Provider, MD  sevelamer carbonate (RENVELA) 800 MG tablet Take 2,400 mg by mouth 3 (three) times daily with meals. 03/24/15 03/23/16  Historical Provider, MD   BP 143/106 mmHg  Pulse 107  Temp(Src) 98.3 F (36.8 C) (Oral)  Resp 20  Ht 5\' 3"  (1.6 m)  Wt 115 lb (52.164 kg)  BMI 20.38 kg/m2  SpO2 100% Physical Exam  Constitutional: She is oriented to person, place, and time. She appears well-developed and well-nourished. No distress.  HENT:  Head: Normocephalic and atraumatic.  Mouth/Throat: Oropharynx is clear and moist.  Eyes: Conjunctivae and EOM are normal. Pupils are equal, round, and reactive to light.  Neck: Normal range of motion. Neck supple. No JVD present.  Cardiovascular: Regular rhythm, normal heart sounds and intact distal pulses.   Tachy. No extremity edema.  Pulmonary/Chest: Effort normal and breath sounds normal. No respiratory distress. She exhibits no tenderness.  Abdominal: Soft. Bowel sounds are normal. She exhibits distension. There is no tenderness.  Musculoskeletal: Normal range of motion. She exhibits no edema.  Neurological: She is alert and oriented to person, place, and time. She has normal strength. No sensory deficit.  Speech fluent, goal oriented. Moves extremities without ataxia. Equal grip strength bilateral.  Skin: Skin is warm and dry. She is not diaphoretic.  Fistula L upper arm with palpable thrill.  Psychiatric: She has a normal mood and affect. Her behavior is normal.  Nursing note and vitals reviewed.   ED Course  Procedures (including critical care time) Labs Review Labs Reviewed  CBC WITH DIFFERENTIAL/PLATELET - Abnormal; Notable for the following:    RBC 3.68 (*)    Hemoglobin 10.4 (*)    HCT 33.1 (*)    RDW 19.4 (*)    All other components within  normal limits  BASIC METABOLIC PANEL - Abnormal; Notable for the following:    Potassium 7.1 (*)    Chloride 98 (*)    CO2 19 (*)    BUN 123 (*)    Creatinine, Ser 13.42 (*)    GFR calc non Af Amer 3 (*)    GFR calc Af Amer 4 (*)    Anion gap 20 (*)    All other components within normal limits  PROTIME-INR - Abnormal; Notable for the following:    Prothrombin Time 18.3 (*)    INR 1.51 (*)    All other components within normal limits  TROPONIN I - Abnormal; Notable for the following:    Troponin I 0.32 (*)    All other components within normal limits  BRAIN NATRIURETIC PEPTIDE - Abnormal; Notable for the following:    B Natriuretic Peptide >4500.0 (*)    All other components within normal limits  GLUCOSE, RANDOM  Imaging Review Dg Chest 2 View  06/29/2015   CLINICAL DATA:  Chest pain since 06/28/2015.  EXAM: CHEST  2 VIEW  COMPARISON:  PA and lateral chest 04/02/2015.  FINDINGS: There is marked cardiomegaly without edema. The lungs are clear. No pneumothorax or pleural effusion. AICD is noted.  IMPRESSION: Cardiomegaly without acute disease.  Stable compared to prior exam.   Electronically Signed   By: Drusilla Kanner M.D.   On: 06/29/2015 16:05   I have personally reviewed and evaluated these images and lab results as part of my medical decision-making.   EKG Interpretation   Date/Time:  Tuesday June 29 2015 15:39:15 EDT Ventricular Rate:  96 PR Interval:  128 QRS Duration: 96 QT Interval:  452 QTC Calculation: 571 R Axis:   28 Text Interpretation:  Critical Test Result: Long QTc Normal sinus  rhythm Nonspecific ST and T wave abnormality Prolonged QT Abnormal ECG No  significant change was found Confirmed by Manus Gunning  MD, STEPHEN [phone]) on  06/29/2015 3:43:08 PM      MDM   Final diagnoses:  Hyperkalemia  ESRD needing dialysis  Midsternal chest pain   Nontoxic/nonseptic appearing, NAD. Afebrile. Mildly tachycardic and hypertensive on arrival. Vitals otherwise  stable. No hypoxia. Missed 2 dialysis sessions. Chest pain most likely from a needing dialysis. Doubt cardiac. Low suspicion for PE. Labs confirming patients need for emergent dialysis. Potassium 7.1. Will give insulin, calcium gluconate and sodium bicarbonate. Chest x-ray unchanged. EKG unchanged. I spoke with nephrologist on call Dr. Briant Cedar who states there are many requiring emergent dialysis today and the patient would not be able to have dialysis emergently at [facility]. I spoke with Dr. Ophelia Charter, nephrologist on call at [facility] who states the patient can be sent through the emergency department to receive emergent dialysis. Patient will be transferred. Stable for transfer.  Discussed with attending Dr. Manus Gunning who also evaluated patient and agrees with plan of care.  Kathrynn Speed, PA-C 06/29/15 2213  Glynn Octave, MD 06/30/15 6045

## 2015-06-29 NOTE — ED Notes (Signed)
Pt states that she has missed her last two treatments of dialysis and started having chest pain yesterday

## 2015-06-29 NOTE — ED Notes (Signed)
Conard Novak, RN and Dr. Manus Gunning notified of pt k+ of 7.1.

## 2015-09-20 ENCOUNTER — Inpatient Hospital Stay (HOSPITAL_BASED_OUTPATIENT_CLINIC_OR_DEPARTMENT_OTHER)
Admission: EM | Admit: 2015-09-20 | Discharge: 2015-09-25 | DRG: 640 | Disposition: A | Discharge status: Home / Self Care | Payer: Medicare Other | Attending: Internal Medicine | Admitting: Internal Medicine

## 2015-09-20 ENCOUNTER — Encounter (HOSPITAL_BASED_OUTPATIENT_CLINIC_OR_DEPARTMENT_OTHER): Payer: Self-pay | Admitting: Emergency Medicine

## 2015-09-20 ENCOUNTER — Emergency Department (HOSPITAL_BASED_OUTPATIENT_CLINIC_OR_DEPARTMENT_OTHER): Payer: Medicare Other

## 2015-09-20 DIAGNOSIS — E875 Hyperkalemia: Secondary | ICD-10-CM | POA: Diagnosis not present

## 2015-09-20 DIAGNOSIS — N19 Unspecified kidney failure: Secondary | ICD-10-CM

## 2015-09-20 DIAGNOSIS — Z9885 Transplanted organ removal status: Secondary | ICD-10-CM

## 2015-09-20 DIAGNOSIS — Z885 Allergy status to narcotic agent status: Secondary | ICD-10-CM

## 2015-09-20 DIAGNOSIS — I429 Cardiomyopathy, unspecified: Secondary | ICD-10-CM | POA: Diagnosis present

## 2015-09-20 DIAGNOSIS — Z888 Allergy status to other drugs, medicaments and biological substances status: Secondary | ICD-10-CM

## 2015-09-20 DIAGNOSIS — Z992 Dependence on renal dialysis: Secondary | ICD-10-CM

## 2015-09-20 DIAGNOSIS — Z86711 Personal history of pulmonary embolism: Secondary | ICD-10-CM

## 2015-09-20 DIAGNOSIS — M25559 Pain in unspecified hip: Secondary | ICD-10-CM | POA: Diagnosis present

## 2015-09-20 DIAGNOSIS — I132 Hypertensive heart and chronic kidney disease with heart failure and with stage 5 chronic kidney disease, or end stage renal disease: Secondary | ICD-10-CM | POA: Diagnosis present

## 2015-09-20 DIAGNOSIS — F329 Major depressive disorder, single episode, unspecified: Secondary | ICD-10-CM | POA: Diagnosis present

## 2015-09-20 DIAGNOSIS — R609 Edema, unspecified: Secondary | ICD-10-CM

## 2015-09-20 DIAGNOSIS — Z95810 Presence of automatic (implantable) cardiac defibrillator: Secondary | ICD-10-CM

## 2015-09-20 DIAGNOSIS — Z794 Long term (current) use of insulin: Secondary | ICD-10-CM

## 2015-09-20 DIAGNOSIS — I5022 Chronic systolic (congestive) heart failure: Secondary | ICD-10-CM | POA: Diagnosis present

## 2015-09-20 DIAGNOSIS — G8929 Other chronic pain: Secondary | ICD-10-CM | POA: Diagnosis present

## 2015-09-20 DIAGNOSIS — K219 Gastro-esophageal reflux disease without esophagitis: Secondary | ICD-10-CM | POA: Diagnosis present

## 2015-09-20 DIAGNOSIS — I5023 Acute on chronic systolic (congestive) heart failure: Secondary | ICD-10-CM | POA: Diagnosis present

## 2015-09-20 DIAGNOSIS — Q613 Polycystic kidney, unspecified: Secondary | ICD-10-CM

## 2015-09-20 DIAGNOSIS — D571 Sickle-cell disease without crisis: Secondary | ICD-10-CM | POA: Diagnosis present

## 2015-09-20 DIAGNOSIS — N186 End stage renal disease: Secondary | ICD-10-CM

## 2015-09-20 DIAGNOSIS — Z9119 Patient's noncompliance with other medical treatment and regimen: Secondary | ICD-10-CM

## 2015-09-20 DIAGNOSIS — F17210 Nicotine dependence, cigarettes, uncomplicated: Secondary | ICD-10-CM | POA: Diagnosis present

## 2015-09-20 DIAGNOSIS — Z886 Allergy status to analgesic agent status: Secondary | ICD-10-CM

## 2015-09-20 MED ORDER — SODIUM CHLORIDE 0.9 % IV SOLN: INTRAVENOUS | Status: DC

## 2015-09-20 NOTE — ED Provider Notes (Addendum)
CSN: [id number]     Arrival date & time 09/20/15  2058 History  By signing my name below, I, Lyndel Safe, attest that this documentation has been prepared under the direction and in the presence of Vanetta Mulders, MD. Electronically Signed: Lyndel Safe, ED Scribe. 09/20/2015. 10:39 PM.   Chief Complaint  Patient presents with  . Chest Pain    The history is provided by the patient. No language interpreter was used.   HPI Comments: [name] Small is a 24 y.o. female, with a defibrillator and a PMhx of HTN, CHF, PE and ESRD on hemodialysis T, TH, ST, who presents to the Emergency Department complaining of sudden onset, constant, 9/10, left sternal chest pain onset 4.5 hours ago s/p missing her last 2 dialysis appointments. Pt's last dialysis treatment was 6 days ago at a center in [place] and she states she does not have a dialysis center locally. She associates BLE edema. Pt is followed by a cardiologist in [place]. She has a h/o CHF and a defibrillator s/p dysrhythmia. She denies any firing of her defibrillator with her CP. Pt notes her chest pain is typical of her symptoms experienced after missing dialysis for a significant amount of time. Pt states she took a 'roxy' for her CP at home without relief. She has a left upper extremity fistula. Denies radiation of the pain to her back, SOB, nausea and vomiting.   Past Medical History  Diagnosis Date  . Hemodialysis patient (HCC)   . Hypertension   . Pulmonary emboli (HCC) 01/2012    Bilateral, moderate clot burden, areas of pulmonary infarction and central necrosis  . CHF (congestive heart failure) (HCC)   . Cardiomyopathy   . Dysrhythmia     at times per pt.  . Anemia   . H/O transfusion of packed red blood cells   . 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 Polycystic kidney disease   . Cellulitis and abscess of face  03/22/2013  . Renal insufficiency   . Sickle cell anemia (HCC)   . Chronic pain   . GERD (gastroesophageal reflux disease)   . Depression    Past Surgical History  Procedure Laterality Date  . Nephrectomy    . Av fistula placement    . Kidney transplant  2008    failed  . Tonsillectomy      as a child.  . Adenoidectomy    . Incision and drainage abscess Right 03/21/2013    Procedure: INCISION AND DRAINAGE RIGHT CHEEK ABSCESS REMOVAL OF FOREIGN BODY;  Surgeon: Serena Colonel, MD;  Location: [facility] OR;  Service: ENT;  Laterality: Right;  . Implantable cardioverter defibrillator implant Right 12/2014   Family History  Problem Relation Age of Onset  . Polycystic kidney disease Father    Social History  Substance Use Topics  . Smoking status: Current Every Day Smoker -- 0.20 packs/day    Types: Cigarettes  . Smokeless tobacco: Never Used  . Alcohol Use: No   OB History    No data available     Review of Systems  Constitutional: Negative for fever and chills.  HENT: Negative for congestion, rhinorrhea and sore throat.   Eyes: Negative for visual disturbance.  Respiratory: Positive for cough. Negative for shortness of breath.   Cardiovascular: Positive for chest pain and leg swelling ( BLE).  Gastrointestinal: Negative for nausea, vomiting, abdominal pain and diarrhea.  Genitourinary:  Negative for dysuria and hematuria.  Musculoskeletal: Negative for back pain and neck pain.  Skin: Negative for rash.  Neurological: Positive for headaches.  Hematological: Bruises/bleeds easily.  Psychiatric/Behavioral: Negative for confusion.   Allergies  Tramadol; Iohexol; Morphine and related; and Vicodin  Home Medications   Prior to Admission medications   Medication Sig Start Date End Date Taking? Authorizing Provider  amLODipine (NORVASC) 10 MG tablet Take 1 tablet (10 mg total) by mouth at bedtime. 04/05/15   Christiane Ha, MD  carvedilol (COREG) 6.25 MG tablet Take 1 tablet (6.25 mg  total) by mouth 2 (two) times daily. 03/04/15   Leana Roe Elgergawy, MD  cinacalcet (SENSIPAR) 30 MG tablet Take 1 tablet (30 mg total) by mouth daily with supper. 03/04/15   Leana Roe Elgergawy, MD  isosorbide dinitrate (ISORDIL) 10 MG tablet Take 1 tablet (10 mg total) by mouth 3 (three) times daily. 03/04/15   Leana Roe Elgergawy, MD  LORazepam (ATIVAN) 1 MG tablet Take 1 tablet (1 mg total) by mouth See admin instructions. On dialysis days, before dialysis Patient taking differently: Take 1 mg by mouth daily.  01/29/15   Joseph Art, DO  oxyCODONE (OXY IR/ROXICODONE) 5 MG immediate release tablet Take 5 mg by mouth 3 (three) times daily as needed. For pain 03/11/15   Historical Provider, MD  oxyCODONE-acetaminophen (PERCOCET/ROXICET) 5-325 MG per tablet Take 1 tablet by mouth every 8 (eight) hours as needed for severe pain. 02/25/15   Zannie Cove, MD  sevelamer carbonate (RENVELA) 800 MG tablet Take 2,400 mg by mouth 3 (three) times daily with meals. 03/24/15 03/23/16  Historical Provider, MD   Pulse 99  Resp 28  Ht  (1.6 m)  Wt 110 lb 3.7 oz (50 kg)  BMI 19.53 kg/m2  SpO2 100% Physical Exam  Constitutional: She is oriented to person, place, and time. She appears well-developed and well-nourished. No distress.  HENT:  Head: Normocephalic and atraumatic.  Mouth/Throat: Oropharynx is clear and moist.  Mucous membranes moist.   Eyes: Conjunctivae and EOM are normal. Pupils are equal, round, and reactive to light.  Neck: Normal range of motion.  Cardiovascular: Regular rhythm and normal heart sounds.   Tachycardic but regular.   Pulmonary/Chest: Effort normal and breath sounds normal. No respiratory distress. She has no wheezes.  Abdominal: Soft. Bowel sounds are normal. She exhibits no distension. There is no tenderness.  Musculoskeletal: Normal range of motion. She exhibits edema.  Trace, 1+ pitting edema to BLE.   Neurological: She is alert and oriented to person, place, and time.  Skin:  Skin is warm and dry.  Psychiatric: She has a normal mood and affect. Judgment normal.  Nursing note and vitals reviewed.   ED Course  Procedures  DIAGNOSTIC STUDIES: Oxygen Saturation is 100% on RA, normal by my interpretation.    COORDINATION OF CARE: 10:36 PM Discussed treatment plan with pt. Discussed unremarkable chest Xray and unchanged EKG results. Awaiting lab results. Pt acknowledges and agrees to plan.   Labs Review Labs Reviewed  BASIC METABOLIC PANEL - Abnormal; Notable for the following:    Potassium 7.0 (*)    Chloride 97 (*)    BUN 109 (*)    Creatinine, Ser 12.50 (*)    GFR calc non Af Amer 4 (*)    GFR calc Af Amer 4 (*)    Anion gap 17 (*)    All other components within normal limits  CBC - Abnormal; Notable for the following:  RBC 3.56 (*)    Hemoglobin 10.0 (*)    HCT 31.8 (*)    RDW 19.6 (*)    All other components within normal limits  I-STAT CHEM 8, ED - Abnormal; Notable for the following:    Sodium 134 (*)    Potassium 6.9 (*)    BUN 93 (*)    Creatinine, Ser 13.20 (*)    Calcium, Ion 1.07 (*)    Hemoglobin 11.6 (*)    HCT 34.0 (*)    All other components within normal limits  DIFFERENTIAL  CBC WITH DIFFERENTIAL/PLATELET    Imaging Review Dg Chest Port 1 View  09/20/2015  CLINICAL DATA:  Midchest pain since early this morning. EXAM: PORTABLE CHEST 1 VIEW COMPARISON:  06/29/2015 FINDINGS: There is marked cardiomegaly, unchanged. There are intact appearances of the transvenous cardiac lead. The lungs are clear except for minor unchanged curvilinear scarring in the bases. No large effusions. No pneumothorax. IMPRESSION: Stable cardiomegaly.  No acute cardiopulmonary findings. Electronically Signed   By: Ellery Plunk M.D.   On: 09/20/2015 21:44   I have personally reviewed and evaluated these images and lab results as part of my medical decision-making.   EKG Interpretation   Date/Time:  Monday September 20 2015 21:11:23 EST Ventricular  Rate:  96 PR Interval:    QRS Duration: 90 QT Interval:  416 QTC Calculation: 525 R Axis:   37 Text Interpretation:  Accelerated Junctional rhythm with retrograde  conduction Possible Anterior infarct , age undetermined Prolonged QT  Abnormal ECG Confirmed by Jacqeline Broers  MD, Nakeem Murnane [id number]) on 09/20/2015  9:25:43 PM      MDM   Final diagnoses:  Hyperkalemia  Renal failure   Patient with history of end-stage renal failure. Patient last dialyzed on Tuesday that was down in the [place] area. Patient now appear wanting dialysis. Patient with onset of chest pain however it similar to when she needs dialysis. Chest x-rays negative for any fluid overload. Electrolytes show an elevated potassium 6.9-7. EKG without any acute changes. Cardiac monitor without any acute changes. EKG without any changes at all. Do not feel this is an acute cardiac event. However patient does need dialysis. Discussed with nephrology Dr. with he is accepted patient for dialysis discussed with hospitalist team that of her see the patient in transfer. Patient will most likely go to the dialysis unit awaiting dialysis. Patient has been stable here.  Patient will not be given any calcium gluconate at this time since no significant EKG changes. Or changes on cardiac monitor. Also did ask whether they wanted Kayexalate and it was not recommended.  I personally performed the services described in this documentation, which was scribed in my presence. The recorded information has been reviewed and is accurate.      Vanetta Mulders, MD 09/21/15 6213  Vanetta Mulders, MD 09/21/15 0865

## 2015-09-20 NOTE — ED Notes (Signed)
Patient states that she is having chest pain and has not had dialysis for that last week. Reports that she just started to have the the pain today to her chest.

## 2015-09-21 DIAGNOSIS — R0789 Other chest pain: Secondary | ICD-10-CM | POA: Diagnosis not present

## 2015-09-21 DIAGNOSIS — Z992 Dependence on renal dialysis: Secondary | ICD-10-CM | POA: Diagnosis not present

## 2015-09-21 DIAGNOSIS — N186 End stage renal disease: Secondary | ICD-10-CM | POA: Diagnosis not present

## 2015-09-21 DIAGNOSIS — E875 Hyperkalemia: Secondary | ICD-10-CM | POA: Diagnosis not present

## 2015-09-21 LAB — DIFFERENTIAL
BASOS ABS: 0 10*3/uL (ref 0.0–0.1)
Basophils Relative: 1 %
EOS ABS: 0 10*3/uL (ref 0.0–0.7)
Eosinophils Relative: 0 %
LYMPHS ABS: 1.5 10*3/uL (ref 0.7–4.0)
LYMPHS PCT: 28 %
Monocytes Absolute: 0.6 10*3/uL (ref 0.1–1.0)
Monocytes Relative: 11 %
NEUTROS PCT: 60 %
Neutro Abs: 3.2 10*3/uL (ref 1.7–7.7)

## 2015-09-21 LAB — CBC
HCT: 31.3 % — ABNORMAL LOW (ref 36.0–46.0)
HCT: 31.8 % — ABNORMAL LOW (ref 36.0–46.0)
HEMOGLOBIN: 9.9 g/dL — AB (ref 12.0–15.0)
HEMOGLOBIN: 10 g/dL — AB (ref 12.0–15.0)
MCH: 28.1 pg (ref 26.0–34.0)
MCH: 28.5 pg (ref 26.0–34.0)
MCHC: 31.6 g/dL (ref 30.0–36.0)
MCHC: 31.4 g/dL (ref 30.0–36.0)
MCV: 89.3 fL (ref 78.0–100.0)
MCV: 90.2 fL (ref 78.0–100.0)
PLATELETS: 254 10*3/uL (ref 150–400)
Platelets: 248 10*3/uL (ref 150–400)
RBC: 3.47 MIL/uL — ABNORMAL LOW (ref 3.87–5.11)
RBC: 3.56 MIL/uL — AB (ref 3.87–5.11)
RDW: 19.6 % — ABNORMAL HIGH (ref 11.5–15.5)
RDW: 19.8 % — AB (ref 11.5–15.5)
WBC: 5.3 10*3/uL (ref 4.0–10.5)
WBC: 5.6 10*3/uL (ref 4.0–10.5)

## 2015-09-21 LAB — I-STAT CHEM 8, ED
BUN: 93 mg/dL — AB (ref 6–20)
Calcium, Ion: 1.07 mmol/L — ABNORMAL LOW (ref 1.12–1.23)
Chloride: 103 mmol/L (ref 101–111)
Creatinine, Ser: 13.2 mg/dL — ABNORMAL HIGH (ref 0.44–1.00)
Glucose, Bld: 87 mg/dL (ref 65–99)
HEMATOCRIT: 34 % — AB (ref 36.0–46.0)
Hemoglobin: 11.6 g/dL — ABNORMAL LOW (ref 12.0–15.0)
Potassium: 6.9 mmol/L (ref 3.5–5.1)
SODIUM: 134 mmol/L — AB (ref 135–145)
TCO2: 23 mmol/L (ref 0–100)

## 2015-09-21 LAB — BASIC METABOLIC PANEL
ANION GAP: 17 — AB (ref 5–15)
BUN: 109 mg/dL — ABNORMAL HIGH (ref 6–20)
CALCIUM: 9.7 mg/dL (ref 8.9–10.3)
CO2: 22 mmol/L (ref 22–32)
Chloride: 97 mmol/L — ABNORMAL LOW (ref 101–111)
Creatinine, Ser: 12.5 mg/dL — ABNORMAL HIGH (ref 0.44–1.00)
GFR, EST AFRICAN AMERICAN: 4 mL/min — AB (ref >60)
GFR, EST NON AFRICAN AMERICAN: 4 mL/min — AB (ref >60)
Glucose, Bld: 94 mg/dL (ref 65–99)
Potassium: 7 mmol/L (ref 3.5–5.1)
Sodium: 136 mmol/L (ref 135–145)

## 2015-09-21 LAB — RENAL FUNCTION PANEL
ALBUMIN: 3 g/dL — AB (ref 3.5–5.0)
ANION GAP: 17 — AB (ref 5–15)
BUN: 109 mg/dL — AB (ref 6–20)
CALCIUM: 10 mg/dL (ref 8.9–10.3)
CHLORIDE: 98 mmol/L — AB (ref 101–111)
CO2: 21 mmol/L — ABNORMAL LOW (ref 22–32)
Creatinine, Ser: 12.75 mg/dL — ABNORMAL HIGH (ref 0.44–1.00)
GFR, EST AFRICAN AMERICAN: 4 mL/min — AB (ref >60)
GFR, EST NON AFRICAN AMERICAN: 4 mL/min — AB (ref >60)
Glucose, Bld: 71 mg/dL (ref 65–99)
PHOSPHORUS: 8.1 mg/dL — AB (ref 2.5–4.6)
POTASSIUM: 6.2 mmol/L — AB (ref 3.5–5.1)
Sodium: 136 mmol/L (ref 135–145)

## 2015-09-21 LAB — MRSA PCR SCREENING: MRSA by PCR: NEGATIVE

## 2015-09-21 LAB — GLUCOSE, CAPILLARY: Glucose-Capillary: 146 mg/dL — ABNORMAL HIGH (ref 65–99)

## 2015-09-21 MED ORDER — SODIUM CHLORIDE 0.9 % IV SOLN
1.0000 g | Freq: Once | INTRAVENOUS | Status: AC
  Administered 2015-09-21: 1 g via INTRAVENOUS
  Filled 2015-09-21: qty 10

## 2015-09-21 MED ORDER — HYDROMORPHONE HCL 1 MG/ML IJ SOLN
INTRAMUSCULAR | Status: AC
Start: 2015-09-21 — End: 2015-09-21
  Administered 2015-09-21: 1 mg
  Filled 2015-09-21: qty 1

## 2015-09-21 MED ORDER — LORAZEPAM 1 MG PO TABS: 1.0000 mg | ORAL_TABLET | Freq: Every day | ORAL | Status: DC | PRN

## 2015-09-21 MED ORDER — DIPHENHYDRAMINE HCL 25 MG PO CAPS
25.0000 mg | ORAL_CAPSULE | Freq: Once | ORAL | Status: AC
  Administered 2015-09-21: 25 mg via ORAL
  Filled 2015-09-21: qty 1

## 2015-09-21 MED ORDER — INSULIN REGULAR HUMAN 100 UNIT/ML IJ SOLN: 6.0000 [IU] | Freq: Once | INTRAMUSCULAR | Status: DC

## 2015-09-21 MED ORDER — HEPARIN SODIUM (PORCINE) 1000 UNIT/ML DIALYSIS: 1000.0000 [IU] | INTRAMUSCULAR | Status: DC | PRN

## 2015-09-21 MED ORDER — LIDOCAINE-PRILOCAINE 2.5-2.5 % EX CREA
1.0000 | TOPICAL_CREAM | CUTANEOUS | Status: DC | PRN
Start: 2015-09-21 — End: 2015-09-23
  Filled 2015-09-21: qty 5

## 2015-09-21 MED ORDER — ALTEPLASE 2 MG IJ SOLR
2.0000 mg | Freq: Once | INTRAMUSCULAR | Status: DC | PRN
  Filled 2015-09-21: qty 2

## 2015-09-21 MED ORDER — PENTAFLUOROPROP-TETRAFLUOROETH EX AERO
1.0000 | INHALATION_SPRAY | CUTANEOUS | Status: DC | PRN
Start: 2015-09-21 — End: 2015-09-23

## 2015-09-21 MED ORDER — DEXTROSE 50 % IV SOLN
INTRAVENOUS | Status: AC
  Filled 2015-09-21: qty 50

## 2015-09-21 MED ORDER — SEVELAMER CARBONATE 800 MG PO TABS
2400.0000 mg | ORAL_TABLET | Freq: Three times a day (TID) | ORAL | Status: DC
  Administered 2015-09-21 – 2015-09-25 (×11): 2400 mg via ORAL
  Filled 2015-09-21 (×11): qty 3

## 2015-09-21 MED ORDER — SODIUM BICARBONATE 8.4 % IV SOLN
50.0000 meq | Freq: Once | INTRAVENOUS | Status: AC
  Administered 2015-09-21: 50 meq via INTRAVENOUS
  Filled 2015-09-21: qty 50

## 2015-09-21 MED ORDER — AMLODIPINE BESYLATE 10 MG PO TABS
10.0000 mg | ORAL_TABLET | Freq: Every day | ORAL | Status: DC
Start: 2015-09-21 — End: 2015-09-25
  Administered 2015-09-21 – 2015-09-24 (×4): 10 mg via ORAL
  Filled 2015-09-21 (×4): qty 1

## 2015-09-21 MED ORDER — LIDOCAINE HCL (PF) 1 % IJ SOLN: 5.0000 mL | INTRAMUSCULAR | Status: DC | PRN

## 2015-09-21 MED ORDER — HYDROMORPHONE HCL 2 MG/ML IJ SOLN
1.0000 mg | Freq: Once | INTRAMUSCULAR | Status: AC
  Administered 2015-09-21: 1 mg via INTRAMUSCULAR
  Filled 2015-09-21: qty 0.5

## 2015-09-21 MED ORDER — SODIUM CHLORIDE 0.9 % IJ SOLN
3.0000 mL | Freq: Two times a day (BID) | INTRAMUSCULAR | Status: DC
  Administered 2015-09-21 – 2015-09-25 (×9): 3 mL via INTRAVENOUS

## 2015-09-21 MED ORDER — HYDROMORPHONE HCL 2 MG/ML IJ SOLN: 1.0000 mg | INTRAMUSCULAR | Status: DC | PRN

## 2015-09-21 MED ORDER — CARVEDILOL 6.25 MG PO TABS
6.2500 mg | ORAL_TABLET | Freq: Two times a day (BID) | ORAL | Status: DC
  Administered 2015-09-21 – 2015-09-25 (×8): 6.25 mg via ORAL
  Filled 2015-09-21 (×9): qty 1

## 2015-09-21 MED ORDER — ONDANSETRON HCL 4 MG/2ML IJ SOLN: 4.0000 mg | Freq: Four times a day (QID) | INTRAMUSCULAR | Status: DC | PRN

## 2015-09-21 MED ORDER — DEXTROSE 50 % IV SOLN
1.0000 | Freq: Once | INTRAVENOUS | Status: AC
  Administered 2015-09-21: 50 mL via INTRAVENOUS

## 2015-09-21 MED ORDER — HYDROMORPHONE HCL 1 MG/ML IJ SOLN
INTRAMUSCULAR | Status: AC
  Filled 2015-09-21: qty 1

## 2015-09-21 MED ORDER — INSULIN REGULAR HUMAN 100 UNIT/ML IJ SOLN
6.0000 [IU] | Freq: Once | INTRAMUSCULAR | Status: AC
  Administered 2015-09-21: 6 [IU] via INTRAVENOUS

## 2015-09-21 MED ORDER — SODIUM CHLORIDE 0.9 % IV SOLN: 100.0000 mL | INTRAVENOUS | Status: DC | PRN

## 2015-09-21 MED ORDER — SODIUM CHLORIDE 0.9 % IV SOLN
100.0000 mL | INTRAVENOUS | Status: DC | PRN
Start: 2015-09-21 — End: 2015-09-23

## 2015-09-21 MED ORDER — OXYCODONE-ACETAMINOPHEN 5-325 MG PO TABS
2.0000 | ORAL_TABLET | Freq: Once | ORAL | Status: AC
Start: 2015-09-21 — End: 2015-09-21
  Administered 2015-09-21: 2 via ORAL
  Filled 2015-09-21: qty 2

## 2015-09-21 MED ORDER — ISOSORBIDE DINITRATE 10 MG PO TABS
10.0000 mg | ORAL_TABLET | Freq: Three times a day (TID) | ORAL | Status: DC
  Administered 2015-09-21 – 2015-09-25 (×11): 10 mg via ORAL
  Filled 2015-09-21 (×12): qty 1

## 2015-09-21 MED ORDER — DIPHENHYDRAMINE HCL 25 MG PO CAPS
50.0000 mg | ORAL_CAPSULE | Freq: Once | ORAL | Status: AC
  Administered 2015-09-21: 50 mg via ORAL
  Filled 2015-09-21: qty 2

## 2015-09-21 MED ORDER — HYDROMORPHONE HCL 1 MG/ML IJ SOLN
1.0000 mg | INTRAMUSCULAR | Status: DC | PRN
  Administered 2015-09-21 – 2015-09-23 (×11): 1 mg via INTRAVENOUS
  Filled 2015-09-21 (×11): qty 1

## 2015-09-21 MED ORDER — ONDANSETRON HCL 4 MG PO TABS: 4.0000 mg | ORAL_TABLET | Freq: Four times a day (QID) | ORAL | Status: DC | PRN

## 2015-09-21 MED ORDER — CINACALCET HCL 30 MG PO TABS
30.0000 mg | ORAL_TABLET | Freq: Every day | ORAL | Status: DC
  Administered 2015-09-21 – 2015-09-25 (×4): 30 mg via ORAL
  Filled 2015-09-21 (×4): qty 1

## 2015-09-21 MED ORDER — OXYCODONE-ACETAMINOPHEN 5-325 MG PO TABS
1.0000 | ORAL_TABLET | ORAL | Status: DC | PRN
Start: 2015-09-21 — End: 2015-09-21

## 2015-09-21 MED ORDER — INSULIN REGULAR HUMAN 100 UNIT/ML IJ SOLN: 6.0000 [IU] | Freq: Once | INTRAMUSCULAR | Status: AC

## 2015-09-21 MED ORDER — INSULIN REGULAR HUMAN 100 UNIT/ML IJ SOLN
INTRAMUSCULAR | Status: AC
  Filled 2015-09-21: qty 2.5

## 2015-09-21 MED ORDER — DIPHENHYDRAMINE HCL 25 MG PO CAPS
25.0000 mg | ORAL_CAPSULE | ORAL | Status: DC | PRN
  Administered 2015-09-23 – 2015-09-24 (×2): 25 mg via ORAL
  Filled 2015-09-21: qty 1

## 2015-09-21 MED ORDER — HEPARIN SODIUM (PORCINE) 5000 UNIT/ML IJ SOLN
5000.0000 [IU] | Freq: Three times a day (TID) | INTRAMUSCULAR | Status: DC
  Filled 2015-09-21 (×4): qty 1

## 2015-09-21 NOTE — Progress Notes (Signed)
Patient received IV dilaudid at 1827 and is now complaining of itching from head to toe. No hives noted. She states this has happened before and needed benadryl to resolve it. Patient received a dose of IV dilaudid earlier this afternoon with no c/o itching. Night shift NP paged, awaiting new orders.  Leanna Battles, RN.

## 2015-09-21 NOTE — Progress Notes (Signed)
New Admission Note:  Arrival Method: via stretcher with [facility] Mental Orientation: Alert and oriented x4 Telemetry: box 17, CCMD notified Assessment: Completed Skin:dry and intact IV: right EJ Pain: 8/10, MD notified Tubes:n/a Safety Measures: Safety Fall Prevention Plan discussed. Admission: Completed [address] Orientation: Patient has been orientated to the room, unit and the staff. Family: none at bedside  Orders have been reviewed and implemented. Will continue to monitor the patient. Call light has been placed within reach and bed alarm has been activated.   Tempie Donning BSN, RN  Phone Number: [phone] [facility] [address] [place] Med/Surg-Renal Unit

## 2015-09-21 NOTE — Consult Note (Signed)
Referring Provider: No ref. provider found Primary Care Physician:  No PCP Per Patient Primary Nephrologist:   [facility]  Reason for Consultation:   Medical management of End Stage Renal Disease Patient with history of polycystic kidney disease. Relocated to the [facility] [place] area and has not received dialysis in about 1 week. She is living with her mother and father.  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 She had a transplant in 2008 that subsequently failed and ended in a transplant nephrectomy in 2012. She has a history of congestive heart failure and underwent placement of an AICD due to a cardiomyopathy and EF < 10%.   She last dialyzed in [place] on [address]  She presented to the ER with a K of 7.0 although no EKG changes and no shortness of breath of chest pain although she had 2 + lower extremity pitting edema  She was transported to [facility] for urgent dialysis     Past Medical History  Diagnosis Date  . Hemodialysis patient (HCC)   . Hypertension   . Pulmonary emboli (HCC) 01/2012    Bilateral, moderate clot burden, areas of pulmonary infarction and central necrosis  . CHF (congestive heart failure) (HCC)   . Cardiomyopathy   . Dysrhythmia     at times per pt.  . Anemia   . H/O transfusion of packed red blood cells   . 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 Polycystic kidney disease   . Cellulitis and abscess of face 03/22/2013  . Renal insufficiency   . Sickle cell anemia (HCC)   . Chronic pain   . GERD (gastroesophageal reflux disease)   . Depression     Past Surgical History  Procedure Laterality Date  . Nephrectomy    . Av fistula placement    . Kidney transplant  2008    failed  . Tonsillectomy      as a child.  . Adenoidectomy    . Incision and drainage abscess Right 03/21/2013   Procedure: INCISION AND DRAINAGE RIGHT CHEEK ABSCESS REMOVAL OF FOREIGN BODY;  Surgeon: Serena Colonel, MD;  Location: [facility] OR;  Service: ENT;  Laterality: Right;  . Implantable cardioverter defibrillator implant Right 12/2014    Prior to Admission medications   Medication Sig Start Date End Date Taking? Authorizing Provider  amLODipine (NORVASC) 10 MG tablet Take 1 tablet (10 mg total) by mouth at bedtime. 04/05/15   Christiane Ha, MD  carvedilol (COREG) 6.25 MG tablet Take 1 tablet (6.25 mg total) by mouth 2 (two) times daily. 03/04/15   Leana Roe Elgergawy, MD  cinacalcet (SENSIPAR) 30 MG tablet Take 1 tablet (30 mg total) by mouth daily with supper. 03/04/15   Leana Roe Elgergawy, MD  isosorbide dinitrate (ISORDIL) 10 MG tablet Take 1 tablet (10 mg total) by mouth 3 (three) times daily. 03/04/15   Leana Roe Elgergawy, MD  LORazepam (ATIVAN) 1 MG tablet Take 1 tablet (1 mg total) by mouth See admin instructions. On dialysis days, before dialysis Patient taking differently: Take 1 mg by mouth daily.  01/29/15   Joseph Art, DO  oxyCODONE (OXY IR/ROXICODONE) 5 MG immediate release tablet Take 5 mg by mouth 3 (three) times daily as needed. For pain 03/11/15   Historical Provider, MD  oxyCODONE-acetaminophen (PERCOCET/ROXICET) 5-325 MG per tablet Take 1 tablet by mouth every  8 (eight) hours as needed for severe pain. 02/25/15   Zannie Cove, MD  sevelamer carbonate (RENVELA) 800 MG tablet Take 2,400 mg by mouth 3 (three) times daily with meals. 03/24/15 03/23/16  Historical Provider, MD    Current Facility-Administered Medications  Medication Dose Route Frequency Provider Last Rate Last Dose  . 0.9 %  sodium chloride infusion   Intravenous Continuous Vanetta Mulders, MD      . 0.9 %  sodium chloride infusion  100 mL Intravenous PRN Elvis Coil, MD      . 0.9 %  sodium chloride infusion  100 mL Intravenous PRN Elvis Coil, MD      . alteplase (CATHFLO ACTIVASE) injection 2 mg  2 mg Intracatheter Once PRN  Elvis Coil, MD      . dextrose 50 % solution           . heparin injection 1,000 Units  1,000 Units Dialysis PRN Elvis Coil, MD      . HYDROmorphone (DILAUDID) injection 1 mg  1 mg Intramuscular Once Vanetta Mulders, MD   1 mg at 09/21/15 0205  . lidocaine (PF) (XYLOCAINE) 1 % injection 5 mL  5 mL Intradermal PRN Elvis Coil, MD      . lidocaine-prilocaine (EMLA) cream 1 application  1 application Topical PRN Elvis Coil, MD      . pentafluoroprop-tetrafluoroeth (GEBAUERS) aerosol 1 application  1 application Topical PRN Elvis Coil, MD      . sodium chloride 0.9 % with insulin regular (NOVOLIN R,HUMULIN R) ADS Med             Allergies as of 09/20/2015 - Review Complete 09/20/2015  Allergen Reaction Noted  . Tramadol Anaphylaxis 11/12/2012  . Iohexol Itching 02/21/2015  . Morphine and related Itching 09/28/2011  . Vicodin [hydrocodone-acetaminophen] Hives 11/12/2012    Family History  Problem Relation Age of Onset  . Polycystic kidney disease Father     Social History   Social History  . Marital Status: Single    Spouse Name: N/A  . Number of Children: N/A  . Years of Education: N/A   Occupational History  . Not on file.   Social History Main Topics  . Smoking status: Current Every Day Smoker -- 0.20 packs/day    Types: Cigarettes  . Smokeless tobacco: Never Used  . Alcohol Use: No  . Drug Use: No  . Sexual Activity: Not on file   Other Topics Concern  . Not on file   Social History Narrative   Was smoking 3 cigs per day. Lives at home with roommates, has some family locally.     Review of Systems: Gen: Denies any fever, chills, sweats, anorexia, fatigue, weakness, malaise, weight loss, and sleep disorder HEENT: No visual complaints, No history of Retinopathy. Normal external appearance No Epistaxis or Sore throat. No sinusitis.   CV: Denies chest pain, angina, palpitations, syncope, orthopnea, PND, + peripheral edema, and no claudication. Resp: Denies dyspnea  at rest, +  dyspnea with exercise, no cough, sputum, wheezing, coughing up blood, and pleurisy. GI: Denies vomiting blood, jaundice, and fecal incontinence.   Denies dysphagia or odynophagia. GU : Denies urinary burning, blood in urine, urinary frequency, urinary hesitancy, nocturnal urination, and urinary incontinence.  No renal calculi. MS: Denies joint pain, limitation of movement, and swelling, stiffness, low back pain, extremity pain. Denies muscle weakness, cramps, atrophy.  No use of non steroidal antiinflammatory drugs. Derm: Denies rash, itching, dry skin, hives, moles, warts, or unhealing ulcers.  Psych: Denies depression, anxiety, memory loss, suicidal ideation, hallucinations, paranoia, and confusion. Heme: Denies bruising, bleeding, and enlarged lymph nodes. Neuro: No headache.  No diplopia. No dysarthria.  No dysphasia.  No history of CVA.  No Seizures. No paresthesias.  No weakness. Endocrine No DM.  No Thyroid disease.  No Adrenal disease.  Physical Exam: Vital signs in last 24 hours: Temp:  [97.9 F (36.6 C)-99 F (37.2 C)] 98.2 F (36.8 C) (11/29 0600) Pulse Rate:  [95-99] 99 (11/29 0600) Resp:  [18-28] 20 (11/29 0600) BP: (137-148)/(96-120) 137/96 mmHg (11/29 0600) SpO2:  [98 %-100 %] 100 % (11/29 0600) Weight:  [50 kg (110 lb 3.7 oz)] 50 kg (110 lb 3.7 oz) (11/28 2104)   General:   Alert,  Well-developed, well-nourished, pleasant and cooperative in NAD Head:  Normocephalic and atraumatic. Eyes:  Sclera clear, no icterus.   Conjunctiva pink. Ears:  Normal auditory acuity. Nose:  No deformity, discharge,  or lesions. Mouth:  No deformity or lesions, dentition normal. Neck:  Supple; no masses or thyromegaly. JVP not elevated Lungs:  Clear throughout to auscultation.   No wheezes, crackles, or rhonchi. No acute distress. Heart:  Regular rate and rhythm; no murmurs, clicks, rubs,  or gallops. Defibrillator box without swelling or tenderness Abdomen:  Soft, nontender and  nondistended. No masses, hepatosplenomegaly or hernias noted. Normal bowel sounds, without guarding, and without rebound.   Msk:  Symmetrical without gross deformities. Normal posture. Pulses:  No carotid, renal, femoral bruits. DP and PT symmetrical and equal Extremities:  Without clubbing   2+ Edema Neurologic:  Alert and  oriented x4;  grossly normal neurologically. Skin:  Intact without significant lesions or rashes. Cervical Nodes:  No significant cervical adenopathy. Psych:  Alert and cooperative. Normal mood and affect.  Intake/Output from previous day:   Intake/Output this shift:    Lab Results:  Recent Labs  09/20/15 2349 09/21/15 0015  WBC 5.3  --   HGB 10.0* 11.6*  HCT 31.8* 34.0*  PLT 248  --    BMET  Recent Labs  09/20/15 2349 09/21/15 0015  NA 136 134*  K 7.0* 6.9*  CL 97* 103  CO2 22  --   GLUCOSE 94 87  BUN 109* 93*  CREATININE 12.50* 13.20*  CALCIUM 9.7  --    LFT No results for input(s): PROT, ALBUMIN, AST, ALT, ALKPHOS, BILITOT, BILIDIR, IBILI in the last 72 hours. PT/INR No results for input(s): LABPROT, INR in the last 72 hours. Hepatitis Panel No results for input(s): HEPBSAG, HCVAB, HEPAIGM, HEPBIGM in the last 72 hours.  Studies/Results: Dg Chest Port 1 View  09/20/2015  CLINICAL DATA:  Midchest pain since early this morning. EXAM: PORTABLE CHEST 1 VIEW COMPARISON:  06/29/2015 FINDINGS: There is marked cardiomegaly, unchanged. There are intact appearances of the transvenous cardiac lead. The lungs are clear except for minor unchanged curvilinear scarring in the bases. No large effusions. No pneumothorax. IMPRESSION: Stable cardiomegaly.  No acute cardiopulmonary findings. Electronically Signed   By: Ellery Plunk M.D.   On: 09/20/2015 21:44    Assessment/Plan:  ESRD- TTS dialysis [place] dialysis unit  --- relocating to [facility] area. Had not set up any treatments and presented to ER with hyperkalemia. Will plan emergent  dialysis and organize placement in local dialysis unit  ANEMIA-  Hb > 10 she has history of sickle cell and this may be her baseline Hb: we have requested records from [place] unit   MBD- Her outpatient medications include  renvela  We have requested records of Bone mineral indices  HTN/VOL-  EDW 51 Kg and she weighs in above 60 Kg , she is hypervolemic with LE edema but no dyspnea and will remove about 4-5 L today on dialysis and continue to challenge her dry weight  ACCESS- AVF left arm and has reasonably good function -- access history records will need to be obtained  OTHER-  Cardiomyopathy -- Defibrillator and Beta Blocker usage    LOS: 0 Erricka Falkner W @TODAY @7 :45 AM

## 2015-09-21 NOTE — H&P (Signed)
[facility] History and Physical  [name] Small ZOX:[id number] DOB: [birth year] DOA: 09/20/2015  Referring physician: Emergency Department PCP: No PCP Per Patient   CHIEF COMPLAINT:  chest pain   HPI: [name] Small is a 24 y.o. female  With multiple medical problems not limited to HTN, pulmonary embolism, chronic systolic CHF, defibrillator placement and ESRD on HD. Patient present to [facility] ED last night with chest pain. She had not dialyzed in a week (last time was on Tuesday in [place]). Patient hyperkalemic, no EKG changes. Her chest pain is mid sternum, non-radiating. It is worse with deep breaths. She occasionally coughs up phlegm. She endorses loose stools 2-3 times a day for a couple of days. No recent antibiotics  ED COURSE:   [facility] spoke with Dr. Hyman Hopes (Nephrology). He plans to dialyze patient today  Labs:   Na 134, K+ 6.9 BNPTrop, gap, L.acid  Urinalysis:     CXR:  Stable cardiomegaly    EKG:    Sinus rhythm with 1st degree A-V block Possible Anterior infarct , age undetermined T wave abnormality, consider inferolateral ischemia Abnormal ECG  Medications  0.9 %  sodium chloride infusion (not administered)  HYDROmorphone (DILAUDID) injection 1 mg (1 mg Intramuscular Not Given 09/21/15 0205)  dextrose 50 % solution (not administered)  sodium chloride 0.9 % with insulin regular (NOVOLIN R,HUMULIN R) ADS Med (not administered)  HYDROmorphone (DILAUDID) 1 MG/ML injection (1 mg  Given 09/21/15 0204)  diphenhydrAMINE (BENADRYL) capsule 25 mg (25 mg Oral Given 09/21/15 0303)  calcium gluconate 1 g in sodium chloride 0.9 % 100 mL IVPB (1 g Intravenous Transfusing/Transfer 09/21/15 0459)  sodium bicarbonate injection 50 mEq (50 mEq Intravenous Given 09/21/15 0449)  dextrose 50 % solution 50 mL (50 mLs Intravenous Given 09/21/15 0454)  insulin regular (NOVOLIN R,HUMULIN R) 100 units/mL injection 6 Units (6 Units Intravenous Given 09/21/15 0457)  insulin  regular (NOVOLIN R,HUMULIN R) 100 units/mL injection 6 Units (0 Units Intravenous Duplicate 09/21/15 0458)  oxyCODONE-acetaminophen (PERCOCET/ROXICET) 5-325 MG per tablet 2 tablet (2 tablets Oral Given 09/21/15 0650)    Review of Systems  Constitutional: Negative.   HENT: Negative.   Eyes: Negative.   Respiratory: Negative.   Cardiovascular: Positive for chest pain.  Gastrointestinal: Positive for diarrhea.  Genitourinary: Negative.   Musculoskeletal: Negative.   Skin: Negative.   Neurological: Negative.   Endo/Heme/Allergies: Negative.   Psychiatric/Behavioral: Negative.     Past Medical History  Diagnosis Date  . Hemodialysis patient (HCC)   . Hypertension   . Pulmonary emboli (HCC) 01/2012    Bilateral, moderate clot burden, areas of pulmonary infarction and central necrosis  . CHF (congestive heart failure) (HCC)   . Cardiomyopathy   . Dysrhythmia     at times per pt.  . Anemia   . H/O transfusion of packed red blood cells   . 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 Polycystic kidney disease   . Cellulitis and abscess of face 03/22/2013  . Renal insufficiency   . Sickle cell anemia (HCC)   . Chronic pain   . GERD (gastroesophageal reflux disease)   . Depression    Past Surgical History  Procedure Laterality Date  . Nephrectomy    . Av fistula placement    . Kidney transplant  2008    failed  . Tonsillectomy      as a child.  . Adenoidectomy    .  Incision and drainage abscess Right 03/21/2013    Procedure: INCISION AND DRAINAGE RIGHT CHEEK ABSCESS REMOVAL OF FOREIGN BODY;  Surgeon: Serena Colonel, MD;  Location: [facility] OR;  Service: ENT;  Laterality: Right;  . Implantable cardioverter defibrillator implant Right 12/2014    SOCIAL HISTORY:  reports that she has been smoking Cigarettes.  She has been smoking about 0.20 packs per day. She has never used smokeless  tobacco. She reports that she does not drink alcohol or use illicit drugs. Lives:   At home with parents.  Assistive devices:   None needed for ambulation.   Allergies  Allergen Reactions  . Tramadol Anaphylaxis  . Iohexol Itching  . Morphine And Related Itching    Ok with oxycodone  . Vicodin [Hydrocodone-Acetaminophen] Hives    Family History  Problem Relation Age of Onset  . Polycystic kidney disease Father     Prior to Admission medications   Medication Sig Start Date End Date Taking? Authorizing Provider  amLODipine (NORVASC) 10 MG tablet Take 1 tablet (10 mg total) by mouth at bedtime. 04/05/15   Christiane Ha, MD  carvedilol (COREG) 6.25 MG tablet Take 1 tablet (6.25 mg total) by mouth 2 (two) times daily. 03/04/15   Leana Roe Elgergawy, MD  cinacalcet (SENSIPAR) 30 MG tablet Take 1 tablet (30 mg total) by mouth daily with supper. 03/04/15   Leana Roe Elgergawy, MD  isosorbide dinitrate (ISORDIL) 10 MG tablet Take 1 tablet (10 mg total) by mouth 3 (three) times daily. 03/04/15   Leana Roe Elgergawy, MD  LORazepam (ATIVAN) 1 MG tablet Take 1 tablet (1 mg total) by mouth See admin instructions. On dialysis days, before dialysis Patient taking differently: Take 1 mg by mouth daily.  01/29/15   Joseph Art, DO  oxyCODONE (OXY IR/ROXICODONE) 5 MG immediate release tablet Take 5 mg by mouth 3 (three) times daily as needed. For pain 03/11/15   Historical Provider, MD  oxyCODONE-acetaminophen (PERCOCET/ROXICET) 5-325 MG per tablet Take 1 tablet by mouth every 8 (eight) hours as needed for severe pain. 02/25/15   Zannie Cove, MD  sevelamer carbonate (RENVELA) 800 MG tablet Take 2,400 mg by mouth 3 (three) times daily with meals. 03/24/15 03/23/16  Historical Provider, MD   PHYSICAL EXAM: Filed Vitals:   09/21/15 0400 09/21/15 0414 09/21/15 0430 09/21/15 0600  BP:  139/113 146/108 137/96  Pulse: 99 98 99 99  Temp:  98.2 F (36.8 C)  98.2 F (36.8 C)  TempSrc:  Oral  Oral  Resp: Height:     (1.6 m)  Weight:      SpO2: 98% 98% 100% 100%    Wt Readings from Last 3 Encounters:  09/20/15 50 kg (110 lb 3.7 oz)  06/29/15 52.164 kg (115 lb)  04/05/15 52.5 kg (115 lb 11.9 oz)    General:  Pleasant black female. Appears calm and comfortable Eyes: PER, normal lids, irises & conjunctiva ENT: grossly normal hearing, lips & tongue Neck: no LAD, no masses Cardiovascular: RRR, no murmurs. 1+ BLE edema.  Respiratory: Respirations even and unlabored. Normal respiratory effort. Lungs CTA bilaterally, no wheezes / rales .   Abdomen: soft, non-distended, non-tender, active bowel sounds. No obvious masses.  Skin: no rash seen on limited exam Musculoskeletal: grossly normal tone BUE/BLE Psychiatric: grossly normal mood and affect, speech fluent and appropriate Neurologic: grossly non-focal.         LABS ON ADMISSION:    Basic Metabolic Panel:  Recent Labs Lab 09/20/15 2349 09/21/15 0015  NA 136 134*  K 7.0* 6.9*  CL 97* 103  CO2 22  --   GLUCOSE 94 87  BUN 109* 93*  CREATININE 12.50* 13.20*  CALCIUM 9.7  --     CBC:  Recent Labs Lab 09/20/15 2349 09/21/15 0015  WBC 5.3  --   NEUTROABS 3.2  --   HGB 10.0* 11.6*  HCT 31.8* 34.0*  MCV 89.3  --   PLT 248  --    Radiological Exams on Admission: Dg Chest Port 1 View  09/20/2015  CLINICAL DATA:  Midchest pain since early this morning. EXAM: PORTABLE CHEST 1 VIEW COMPARISON:  06/29/2015 FINDINGS: There is marked cardiomegaly, unchanged. There are intact appearances of the transvenous cardiac lead. The lungs are clear except for minor unchanged curvilinear scarring in the bases. No large effusions. No pneumothorax. IMPRESSION: Stable cardiomegaly.  No acute cardiopulmonary findings. Electronically Signed   By: Ellery Plunk M.D.   On: 09/20/2015 21:44    ASSESSMENT / PLAN    Polycystic kidney disease / ESRD on dialysis / failed kidney transplant 2008 . Last dialysis one week ago. Transferred  from [facility] (just moved from [place] area) -admit to observation - telemetry bed -already in dialysis  Hyperkalemia. K+ 7.0. No EKG changes. EDP at [facility] spoke with Nephrology prior to patient's arrival here and no Kayexlate recommended. K+ should improve with dialysis today.   Chronic, intermittent chest pain /history of pulmonary embolism April 2013.   Has had similar pain when skipped dialysis. Pain worse with deep breaths. No SOB. CXR negative.  -troponins not checked. She has history of chronically elevated troponin.  -if pain doesn't improve after dialysis may need further workup (D-dimer, chest CTA)   Loose stool. Patient endorsed loose stool 2-3 times a day for two days. -Volume / frequency not overly impressive but if persists or increases then will need to obtain stool studies.    Hypertension. BP elevated this am. Should improve with dialysis.  -Continue home anti-hypertensives (Norvasc, Coreg, Isordil)  Chronic systolic heart failure / CHF, chronic.  LVEF 20-25% in June 2016.  -continue home Coreg and anti-hypertensives.   Chronic normocytic anemia, stable.     CONSULTANTS:   Nephrology  Code Status: full DVT Prophylaxis: Heparin Family Communication:  Patient alert, oriented and understands plan of care.   Disposition Plan: Discharge to home in 24-48 hours   Time spent: 60 minutes Willette Cluster  NP [facility] Pager [phone]

## 2015-09-21 NOTE — Plan of Care (Addendum)
24 yo F with ESRD, skipped dialysis x2.  K is 7.  Call Dr. Hyman Hopes when patient gets here so he can send in dialysis RN to dialyze patient.  Addendum: I called and spoke with Dr. Hyman Hopes at 6:25 AM and informed him of patients arrival on unit, he says that he is ordering dialysis for patient.

## 2015-09-22 DIAGNOSIS — Z9119 Patient's noncompliance with other medical treatment and regimen: Secondary | ICD-10-CM | POA: Diagnosis not present

## 2015-09-22 DIAGNOSIS — Z86711 Personal history of pulmonary embolism: Secondary | ICD-10-CM | POA: Diagnosis not present

## 2015-09-22 DIAGNOSIS — I132 Hypertensive heart and chronic kidney disease with heart failure and with stage 5 chronic kidney disease, or end stage renal disease: Secondary | ICD-10-CM | POA: Diagnosis present

## 2015-09-22 DIAGNOSIS — E875 Hyperkalemia: Principal | ICD-10-CM

## 2015-09-22 DIAGNOSIS — F17210 Nicotine dependence, cigarettes, uncomplicated: Secondary | ICD-10-CM | POA: Diagnosis present

## 2015-09-22 DIAGNOSIS — Z9885 Transplanted organ removal status: Secondary | ICD-10-CM | POA: Diagnosis not present

## 2015-09-22 DIAGNOSIS — Z888 Allergy status to other drugs, medicaments and biological substances status: Secondary | ICD-10-CM | POA: Diagnosis not present

## 2015-09-22 DIAGNOSIS — I10 Essential (primary) hypertension: Secondary | ICD-10-CM

## 2015-09-22 DIAGNOSIS — G8929 Other chronic pain: Secondary | ICD-10-CM | POA: Diagnosis present

## 2015-09-22 DIAGNOSIS — Z992 Dependence on renal dialysis: Secondary | ICD-10-CM | POA: Diagnosis not present

## 2015-09-22 DIAGNOSIS — Z95810 Presence of automatic (implantable) cardiac defibrillator: Secondary | ICD-10-CM | POA: Diagnosis not present

## 2015-09-22 DIAGNOSIS — I5022 Chronic systolic (congestive) heart failure: Secondary | ICD-10-CM | POA: Diagnosis present

## 2015-09-22 DIAGNOSIS — Z794 Long term (current) use of insulin: Secondary | ICD-10-CM | POA: Diagnosis not present

## 2015-09-22 DIAGNOSIS — I429 Cardiomyopathy, unspecified: Secondary | ICD-10-CM | POA: Diagnosis present

## 2015-09-22 DIAGNOSIS — N186 End stage renal disease: Secondary | ICD-10-CM | POA: Diagnosis present

## 2015-09-22 DIAGNOSIS — K219 Gastro-esophageal reflux disease without esophagitis: Secondary | ICD-10-CM | POA: Diagnosis present

## 2015-09-22 DIAGNOSIS — Z885 Allergy status to narcotic agent status: Secondary | ICD-10-CM | POA: Diagnosis not present

## 2015-09-22 DIAGNOSIS — Q613 Polycystic kidney, unspecified: Secondary | ICD-10-CM | POA: Diagnosis not present

## 2015-09-22 DIAGNOSIS — R0789 Other chest pain: Secondary | ICD-10-CM | POA: Diagnosis not present

## 2015-09-22 DIAGNOSIS — F329 Major depressive disorder, single episode, unspecified: Secondary | ICD-10-CM | POA: Diagnosis present

## 2015-09-22 DIAGNOSIS — Z886 Allergy status to analgesic agent status: Secondary | ICD-10-CM | POA: Diagnosis not present

## 2015-09-22 DIAGNOSIS — D571 Sickle-cell disease without crisis: Secondary | ICD-10-CM | POA: Diagnosis present

## 2015-09-22 DIAGNOSIS — M25559 Pain in unspecified hip: Secondary | ICD-10-CM | POA: Diagnosis present

## 2015-09-22 DIAGNOSIS — R609 Edema, unspecified: Secondary | ICD-10-CM | POA: Diagnosis not present

## 2015-09-22 LAB — COMPREHENSIVE METABOLIC PANEL
ALBUMIN: 3 g/dL — AB (ref 3.5–5.0)
ALK PHOS: 232 U/L — AB (ref 38–126)
ALT: 10 U/L — ABNORMAL LOW (ref 14–54)
ANION GAP: 14 (ref 5–15)
AST: 18 U/L (ref 15–41)
BILIRUBIN TOTAL: 1 mg/dL (ref 0.3–1.2)
BUN: 51 mg/dL — AB (ref 6–20)
CALCIUM: 10.1 mg/dL (ref 8.9–10.3)
CO2: 28 mmol/L (ref 22–32)
Chloride: 96 mmol/L — ABNORMAL LOW (ref 101–111)
Creatinine, Ser: 7.97 mg/dL — ABNORMAL HIGH (ref 0.44–1.00)
GFR calc Af Amer: 7 mL/min — ABNORMAL LOW (ref >60)
GFR calc non Af Amer: 6 mL/min — ABNORMAL LOW (ref >60)
GLUCOSE: 83 mg/dL (ref 65–99)
Potassium: 4.7 mmol/L (ref 3.5–5.1)
Sodium: 138 mmol/L (ref 135–145)
TOTAL PROTEIN: 7.3 g/dL (ref 6.5–8.1)

## 2015-09-22 LAB — CBC
HCT: 33.9 % — ABNORMAL LOW (ref 36.0–46.0)
Hemoglobin: 10.5 g/dL — ABNORMAL LOW (ref 12.0–15.0)
MCH: 28.2 pg (ref 26.0–34.0)
MCHC: 31 g/dL (ref 30.0–36.0)
MCV: 91.1 fL (ref 78.0–100.0)
Platelets: 208 10*3/uL (ref 150–400)
RBC: 3.72 MIL/uL — ABNORMAL LOW (ref 3.87–5.11)
RDW: 19.8 % — AB (ref 11.5–15.5)
WBC: 4.9 10*3/uL (ref 4.0–10.5)

## 2015-09-22 NOTE — Progress Notes (Signed)
[facility] PROGRESS NOTE  [name] Small HAF:[id number] DOB: November 13, 1990 DOA: 09/20/2015 PCP: No PCP Per Patient  Assessment/Plan: 1. Pulm edema -from missed HD, improved -s/p Urgent HD last pm -per Renal, needs to be clipped for outpatient HD, moved from [place], [place] and no arrangements for HD made  2. Hyperkalemia. K+ 7.0.  -due to missed HD, corrected with HD -renal diet   3. Hypertension.  -Continue home anti-hypertensives (Norvasc, Coreg, Isordil) -stable now  4. Chronic systolic heart failure / CHF, chronic. LVEF 20-25% in June 2016.  -continue home Coreg and anti-hypertensives.  -volume managed with HD  5. Chronic normocytic anemia -stable.   6. H/o sickle cell disease/chronic hip pain -FU with Ortho/PCP  DVT proph: Hep SQ  Code Status: Full Code Family Communication: none at bedside Disposition Plan: Home when CLIP process completed   Consultants:  Renal  HPI/Subjective: Feels better, has chronic hip pain, Was seeing Orthopedic doc in [place], [place]  Objective: Filed Vitals:   09/22/15 0428 09/22/15 0907  BP: 133/97 126/90  Pulse: 96 100  Temp: 98.7 F (37.1 C) 98.6 F (37 C)  Resp: 18 18    Intake/Output Summary (Last 24 hours) at 09/22/15 1357 Last data filed at 09/22/15 1342  Gross per 24 hour  Intake    720 ml  Output      0 ml  Net    720 ml   Filed Weights   09/21/15 0720 09/21/15 1123 09/21/15 2053  Weight: 61.4 kg (135 lb 5.8 oz) 56.9 kg (125 lb 7.1 oz) 56.9 kg (125 lb 7.1 oz)    Exam:   General:  AAOx3  HEENT: peri-orbital edema noted  Cardiovascular: S!S2/RRR  Respiratory: CTAB  Abdomen:soft, NT, BS present  Musculoskeletal: no edema c/c  Data Reviewed: Basic Metabolic Panel:  Recent Labs Lab 09/20/15 2349 09/21/15 0015 09/21/15 0733 09/22/15 0547  NA 136 134* 136 138  K 7.0* 6.9* 6.2* 4.7  CL 97* 103 98* 96*  CO2 22  --  21* 28  GLUCOSE 94 87 71 83  BUN 109* 93* 109* 51*  CREATININE 12.50* 13.20*  12.75* 7.97*  CALCIUM 9.7  --  10.0 10.1  PHOS  --   --  8.1*  --    Liver Function Tests:  Recent Labs Lab 09/21/15 0733 09/22/15 0547  AST  --  18  ALT  --  10*  ALKPHOS  --  232*  BILITOT  --  1.0  PROT  --  7.3  ALBUMIN 3.0* 3.0*   No results for input(s): LIPASE, AMYLASE in the last 168 hours. No results for input(s): AMMONIA in the last 168 hours. CBC:  Recent Labs Lab 09/20/15 2349 09/21/15 0015 09/21/15 0734 09/22/15 0547  WBC 5.3  --  5.6 4.9  NEUTROABS 3.2  --   --   --   HGB 10.0* 11.6* 9.9* 10.5*  HCT 31.8* 34.0* 31.3* 33.9*  MCV 89.3  --  90.2 91.1  PLT 248  --  254 208   Cardiac Enzymes: No results for input(s): CKTOTAL, CKMB, CKMBINDEX, TROPONINI in the last 168 hours. BNP (last 3 results)  Recent Labs  06/29/15 1615  BNP >4500.0*    ProBNP (last 3 results) No results for input(s): PROBNP in the last 8760 hours.  CBG:  Recent Labs Lab 09/21/15 0522  GLUCAP 146*    Recent Results (from the past 240 hour(s))  MRSA PCR Screening     Status: None   Collection Time: 09/21/15  7:07 AM  Result Value Ref Range Status   MRSA by PCR NEGATIVE NEGATIVE Final    Comment:        The GeneXpert MRSA Assay (FDA approved for NASAL specimens only), is one component of a comprehensive MRSA colonization surveillance program. It is not intended to diagnose MRSA infection nor to guide or monitor treatment for MRSA infections.      Studies: Dg Chest Port 1 View  09/20/2015  CLINICAL DATA:  Midchest pain since early this morning. EXAM: PORTABLE CHEST 1 VIEW COMPARISON:  06/29/2015 FINDINGS: There is marked cardiomegaly, unchanged. There are intact appearances of the transvenous cardiac lead. The lungs are clear except for minor unchanged curvilinear scarring in the bases. No large effusions. No pneumothorax. IMPRESSION: Stable cardiomegaly.  No acute cardiopulmonary findings. Electronically Signed   By: Ellery Plunk M.D.   On: 09/20/2015 21:44     Scheduled Meds: . amLODipine  10 mg Oral QHS  . carvedilol  6.25 mg Oral BID  . cinacalcet  30 mg Oral Q supper  . heparin  5,000 Units Subcutaneous 3 times per day  . isosorbide dinitrate  10 mg Oral TID  . sevelamer carbonate  2,400 mg Oral TID WC  . sodium chloride  3 mL Intravenous Q12H   Continuous Infusions: . sodium chloride     Antibiotics Given (last 72 hours)    None      Active Problems:   ESRD (end stage renal disease) on dialysis (HCC)   HTN (hypertension)   Systolic CHF, chronic (HCC)   Hyperkalemia   Chest pain   ESRD (end stage renal disease) (HCC)    Time spent:    [facility]  [facility] Pager [phone]. If 7PM-7AM, please contact night-coverage at [email or web address], password [facility] 09/22/2015, 1:57 PM  LOS: 1 day

## 2015-09-22 NOTE — Progress Notes (Signed)
09/22/2015 8:34 AM Hemodialysis Note; the CLIP process has been initiated however I have tried previously to obtain an outpatient schedule for this patient and did not have any success. I have tried [facility], [facility] and local [facility] and [facility] dialysis units. [name] Small

## 2015-09-22 NOTE — Progress Notes (Signed)
09/22/2015 4:13 PM Hemodialysis Outpatient Note; Admission has been declined for this patient at this time due to previous history of non compliance. Tilman Neat

## 2015-09-22 NOTE — Progress Notes (Addendum)
1. Subjective: Tolerated HD yest. Co R Pelvic /hip pain.She gives history of Pelvic fracture 5 months ago in [place], "from my brittle bone diseae,was seeing Orthopedist in [place]."  Objective Vital signs in last 24 hours: Filed Vitals:   09/21/15 1214 09/21/15 2053 09/22/15 0428 09/22/15 0907  BP: 131/103 125/90 133/97 126/90  Pulse: 94 98 96 100  Temp: 97.9 F (36.6 C) 98 F (36.7 C) 98.7 F (37.1 C) 98.6 F (37 C)  TempSrc: Oral Oral Oral Oral  Resp: 18 17 18 18   Height:      Weight:  56.9 kg (125 lb 7.1 oz)    SpO2: 100% 100% 100% 100%   Weight change: 11.4 kg (25 lb 2.1 oz)  Physical Exam: General: alert Young BF NAD/pleasant Heart: RRR  With Defibrillator box Lungs: CTA bilat Abdomen: BS pos. Soft ND, NT Extremities:R 2+ pedal edema/Left 1+ pedal edema  Dialysis Access: pos bruit  L armAVF  HD TTS last HD in [place], [place]  Current dry wt ~50kg per pt Orders from May 2016 > [facility] TTS  3h  54.5kg Hep 2000  LUA AVF   Problem/Plan: 1. ESRD HD TTS last HD in [place], [place] 2. Anemia - hgb 10.5  Fu hgb  / no esa check with prior center for ESA med 3. Sickle cell disease/ anemia 4. Secondary hyperparathyroidism - Phos ^ 8.1 on admit binder Renvela , Ckeck with [facility] for Vit D  med 5. HTN/volume - bp 133/97 / (per pt. EDW 50 kg) 6.9 kg up by wts Not sob /some pedal edema 6. Hip / Pelvic pain- po pain med seems to control / admit team RX 7. Sickle Cell anemia- 8.    Disposition -  Needs op  [facility]/ clip process started   Lenny Pastel, PA-C [facility] Beeper [phone] 09/22/2015,9:44 AM  LOS: 1 day   Pt seen, examined and agree w A/P as above. HD today, pursuing CLIP process for HD in [place]/ [place].  Vinson Moselle MD [facility] pager [phone]    cell [phone] 09/22/2015, 12:06 PM    Labs: Basic Metabolic Panel:  Recent Labs Lab 09/20/15 2349 09/21/15 0015 09/21/15 0733 09/22/15 0547   NA 136 134* 136 138  K 7.0* 6.9* 6.2* 4.7  CL 97* 103 98* 96*  CO2 22  --  21* 28  GLUCOSE 94 87 71 83  BUN 109* 93* 109* 51*  CREATININE 12.50* 13.20* 12.75* 7.97*  CALCIUM 9.7  --  10.0 10.1  PHOS  --   --  8.1*  --    Liver Function Tests:  Recent Labs Lab 09/21/15 0733 09/22/15 0547  AST  --  18  ALT  --  10*  ALKPHOS  --  232*  BILITOT  --  1.0  PROT  --  7.3  ALBUMIN 3.0* 3.0*   No results for input(s): LIPASE, AMYLASE in the last 168 hours. No results for input(s): AMMONIA in the last 168 hours. CBC:  Recent Labs Lab 09/20/15 2349 09/21/15 0015 09/21/15 0734 09/22/15 0547  WBC 5.3  --  5.6 4.9  NEUTROABS 3.2  --   --   --   HGB 10.0* 11.6* 9.9* 10.5*  HCT 31.8* 34.0* 31.3* 33.9*  MCV 89.3  --  90.2 91.1  PLT 248  --  254 208   Cardiac Enzymes: No results for input(s): CKTOTAL, CKMB, CKMBINDEX, TROPONINI in the last 168 hours. CBG:  Recent Labs  Lab 09/21/15 0522  GLUCAP 146*    Studies/Results: Dg Chest Port 1 View  09/20/2015  CLINICAL DATA:  Midchest pain since early this morning. EXAM: PORTABLE CHEST 1 VIEW COMPARISON:  06/29/2015 FINDINGS: There is marked cardiomegaly, unchanged. There are intact appearances of the transvenous cardiac lead. The lungs are clear except for minor unchanged curvilinear scarring in the bases. No large effusions. No pneumothorax. IMPRESSION: Stable cardiomegaly.  No acute cardiopulmonary findings. Electronically Signed   By: Ellery Plunk M.D.   On: 09/20/2015 21:44   Medications: . sodium chloride     . amLODipine  10 mg Oral QHS  . carvedilol  6.25 mg Oral BID  . cinacalcet  30 mg Oral Q supper  . heparin  5,000 Units Subcutaneous 3 times per day  . isosorbide dinitrate  10 mg Oral TID  . sevelamer carbonate  2,400 mg Oral TID WC  . sodium chloride  3 mL Intravenous Q12H

## 2015-09-23 ENCOUNTER — Inpatient Hospital Stay (HOSPITAL_COMMUNITY): Payer: Medicare Other

## 2015-09-23 DIAGNOSIS — R609 Edema, unspecified: Secondary | ICD-10-CM

## 2015-09-23 LAB — BASIC METABOLIC PANEL
Anion gap: 15 (ref 5–15)
BUN: 61 mg/dL — ABNORMAL HIGH (ref 6–20)
CALCIUM: 10 mg/dL (ref 8.9–10.3)
CO2: 26 mmol/L (ref 22–32)
CREATININE: 9.28 mg/dL — AB (ref 0.44–1.00)
Chloride: 95 mmol/L — ABNORMAL LOW (ref 101–111)
GFR calc Af Amer: 6 mL/min — ABNORMAL LOW (ref >60)
GFR, EST NON AFRICAN AMERICAN: 5 mL/min — AB (ref >60)
GLUCOSE: 91 mg/dL (ref 65–99)
Potassium: 5 mmol/L (ref 3.5–5.1)
Sodium: 136 mmol/L (ref 135–145)

## 2015-09-23 LAB — CBC
HCT: 32.7 % — ABNORMAL LOW (ref 36.0–46.0)
HEMOGLOBIN: 10 g/dL — AB (ref 12.0–15.0)
MCH: 28 pg (ref 26.0–34.0)
MCHC: 30.6 g/dL (ref 30.0–36.0)
MCV: 91.6 fL (ref 78.0–100.0)
PLATELETS: 211 10*3/uL (ref 150–400)
RBC: 3.57 MIL/uL — AB (ref 3.87–5.11)
RDW: 19.6 % — ABNORMAL HIGH (ref 11.5–15.5)
WBC: 5 10*3/uL (ref 4.0–10.5)

## 2015-09-23 MED ORDER — PENTAFLUOROPROP-TETRAFLUOROETH EX AERO: 1.0000 "application " | INHALATION_SPRAY | CUTANEOUS | Status: DC | PRN

## 2015-09-23 MED ORDER — OXYCODONE HCL 5 MG PO TABS
10.0000 mg | ORAL_TABLET | Freq: Four times a day (QID) | ORAL | Status: DC | PRN
  Administered 2015-09-23 – 2015-09-25 (×8): 10 mg via ORAL
  Filled 2015-09-23 (×6): qty 2

## 2015-09-23 MED ORDER — SODIUM CHLORIDE 0.9 % IV SOLN: 100.0000 mL | INTRAVENOUS | Status: DC | PRN

## 2015-09-23 MED ORDER — LIDOCAINE HCL (PF) 1 % IJ SOLN: 5.0000 mL | INTRAMUSCULAR | Status: DC | PRN

## 2015-09-23 MED ORDER — ALTEPLASE 2 MG IJ SOLR
2.0000 mg | Freq: Once | INTRAMUSCULAR | Status: DC | PRN
  Filled 2015-09-23: qty 2

## 2015-09-23 MED ORDER — OXYCODONE HCL 5 MG PO TABS
ORAL_TABLET | ORAL | Status: AC
  Filled 2015-09-23: qty 2

## 2015-09-23 MED ORDER — KETOROLAC TROMETHAMINE 30 MG/ML IJ SOLN
30.0000 mg | Freq: Once | INTRAMUSCULAR | Status: AC
  Administered 2015-09-23: 30 mg via INTRAVENOUS
  Filled 2015-09-23: qty 1

## 2015-09-23 MED ORDER — OXYCODONE HCL 5 MG PO TABS
5.0000 mg | ORAL_TABLET | Freq: Once | ORAL | Status: AC
  Administered 2015-09-23: 5 mg via ORAL
  Filled 2015-09-23: qty 1

## 2015-09-23 MED ORDER — HEPARIN SODIUM (PORCINE) 1000 UNIT/ML DIALYSIS
2000.0000 [IU] | Freq: Once | INTRAMUSCULAR | Status: DC
Start: 2015-09-23 — End: 2015-09-23

## 2015-09-23 MED ORDER — HEPARIN SODIUM (PORCINE) 1000 UNIT/ML DIALYSIS: 1000.0000 [IU] | INTRAMUSCULAR | Status: DC | PRN

## 2015-09-23 MED ORDER — LIDOCAINE-PRILOCAINE 2.5-2.5 % EX CREA: 1.0000 "application " | TOPICAL_CREAM | CUTANEOUS | Status: DC | PRN

## 2015-09-23 MED ORDER — DIPHENHYDRAMINE HCL 25 MG PO CAPS
ORAL_CAPSULE | ORAL | Status: AC
  Filled 2015-09-23: qty 1

## 2015-09-23 NOTE — Care Management Note (Signed)
Case Management Note  Patient Details  Name: Binh Furia MRN: [id number] Date of Birth: 04-13-1991  Subjective/Objective:            CM following for progression and d/c planning.        Action/Plan: This CM informed that pt is requesting a rolling walker, she states that one was ordered in [place] for delivery to her home, however she is not there to receive. This CM will attempt to order another walker if her insurance will provide. [facility] will explore this issue and determine if she will be able to get a walker.   Expected Discharge Date:                  Expected Discharge Plan:  Home/Self Care  In-House Referral:  Clinical Social Work  Discharge planning Services  CM Consult  Post Acute Care Choice:  Durable Medical Equipment Choice offered to:  Patient  DME Arranged:  Walker rolling DME Agency:  [facility]  HH Arranged:    HH Agency:     Status of Service:  In process, will continue to follow  Medicare Important Message Given:    Date Medicare IM Given:    Medicare IM give by:    Date Additional Medicare IM Given:    Additional Medicare Important Message give by:     If discussed at Long Length of Stay Meetings, dates discussed:    Additional Comments:  Starlyn Skeans, RN 09/23/2015, 3:27 PM

## 2015-09-23 NOTE — Progress Notes (Signed)
VASCULAR LAB PRELIMINARY  PRELIMINARY  PRELIMINARY  PRELIMINARY  Bilateral lower extremity venous duplex completed.    Preliminary report:  There is no DVT or SVT noted in the bilateral lower extremities.   Aryia Delira, RVT 09/23/2015, 10:07 AM

## 2015-09-23 NOTE — Plan of Care (Signed)
Problem: Discharge Planning: Goal: Ability to manage health-related needs will improve Outcome: Not Progressing Difficulty arranging an outpatient HD center.

## 2015-09-23 NOTE — Progress Notes (Signed)
[facility] PROGRESS NOTE  [name] Small ENM:[id number] DOB: [birth year] DOA: 09/20/2015 PCP: No PCP Per Patient  Assessment/Plan: 1. Pulm edema -from missed HD, improved -s/p Urgent HD on admission -per Renal, needs to be clipped for outpatient HD, moved from [place], [place] and no arrangements for HD made -unfortunately cannot be discharged until this set up  2. Hyperkalemia. K+ 7.0.  -due to missed HD, corrected with HD -renal diet   3. Hypertension.  -Continue home anti-hypertensives (Norvasc, Coreg, Isordil) -stable now  4. Chronic systolic heart failure / CHF, chronic. LVEF 20-25% in June 2016.  -continue home Coreg and anti-hypertensives.  -volume managed with HD  5. Chronic normocytic anemia -stable.   6. H/o sickle cell disease/chronic hip pain -FU with Ortho/PCP  DVT proph: Hep SQ  Code Status: Full Code Family Communication: none at bedside Disposition Plan: Home when CLIP process completed   Consultants:  Renal  HPI/Subjective: Feels better, has chronic hip pain, Was seeing Orthopedic doc in [place], [place]  Objective: Filed Vitals:   09/23/15 0510 09/23/15 0900  BP: 132/95 130/90  Pulse: 98 103  Temp: 98.2 F (36.8 C) 98.4 F (36.9 C)  Resp: 18 20    Intake/Output Summary (Last 24 hours) at 09/23/15 1506 Last data filed at 09/23/15 1400  Gross per 24 hour  Intake    600 ml  Output      0 ml  Net    600 ml   Filed Weights   09/21/15 0720 09/21/15 1123 09/21/15 2053  Weight: 61.4 kg (135 lb 5.8 oz) 56.9 kg (125 lb 7.1 oz) 56.9 kg (125 lb 7.1 oz)    Exam:   General:  AAOx3  HEENT: peri-orbital edema noted  Cardiovascular: S!S2/RRR  Respiratory: CTAB  Abdomen:soft, NT, BS present  Musculoskeletal: trace edema c/c  Data Reviewed: Basic Metabolic Panel:  Recent Labs Lab 09/20/15 2349 09/21/15 0015 09/21/15 0733 09/22/15 0547 09/23/15 0627  NA 136 134* 136 138 136  K 7.0* 6.9* 6.2* 4.7 5.0  CL 97* 103 98* 96* 95*   CO2 22  --  21* 28 26  GLUCOSE 94 87 71 83 91  BUN 109* 93* 109* 51* 61*  CREATININE 12.50* 13.20* 12.75* 7.97* 9.28*  CALCIUM 9.7  --  10.0 10.1 10.0  PHOS  --   --  8.1*  --   --    Liver Function Tests:  Recent Labs Lab 09/21/15 0733 09/22/15 0547  AST  --  18  ALT  --  10*  ALKPHOS  --  232*  BILITOT  --  1.0  PROT  --  7.3  ALBUMIN 3.0* 3.0*   No results for input(s): LIPASE, AMYLASE in the last 168 hours. No results for input(s): AMMONIA in the last 168 hours. CBC:  Recent Labs Lab 09/20/15 2349 09/21/15 0015 09/21/15 0734 09/22/15 0547 09/23/15 0627  WBC 5.3  --  5.6 4.9 5.0  NEUTROABS 3.2  --   --   --   --   HGB 10.0* 11.6* 9.9* 10.5* 10.0*  HCT 31.8* 34.0* 31.3* 33.9* 32.7*  MCV 89.3  --  90.2 91.1 91.6  PLT 248  --  254 208 211   Cardiac Enzymes: No results for input(s): CKTOTAL, CKMB, CKMBINDEX, TROPONINI in the last 168 hours. BNP (last 3 results)  Recent Labs  06/29/15 1615  BNP >4500.0*    ProBNP (last 3 results) No results for input(s): PROBNP in the last 8760 hours.  CBG:  Recent Labs Lab  09/21/15 0522  GLUCAP 146*    Recent Results (from the past 240 hour(s))  MRSA PCR Screening     Status: None   Collection Time: 09/21/15  7:07 AM  Result Value Ref Range Status   MRSA by PCR NEGATIVE NEGATIVE Final    Comment:        The GeneXpert MRSA Assay (FDA approved for NASAL specimens only), is one component of a comprehensive MRSA colonization surveillance program. It is not intended to diagnose MRSA infection nor to guide or monitor treatment for MRSA infections.      Studies: No results found.  Scheduled Meds: . amLODipine  10 mg Oral QHS  . carvedilol  6.25 mg Oral BID  . cinacalcet  30 mg Oral Q supper  . heparin  5,000 Units Subcutaneous 3 times per day  . isosorbide dinitrate  10 mg Oral TID  . sevelamer carbonate  2,400 mg Oral TID WC  . sodium chloride  3 mL Intravenous Q12H   Continuous Infusions: . sodium  chloride     Antibiotics Given (last 72 hours)    None      Active Problems:   ESRD (end stage renal disease) on dialysis (HCC)   HTN (hypertension)   Systolic CHF, chronic (HCC)   Hyperkalemia   Chest pain   ESRD (end stage renal disease) (HCC)    Time spent:    [facility]  [facility] Pager [phone]. If 7PM-7AM, please contact night-coverage at [email or web address], password [facility] 09/23/2015, 3:06 PM  LOS: 2 days

## 2015-09-23 NOTE — Progress Notes (Signed)
Subjective:  For HD today /Awaiting Clip OP Kidney center placement  Objective Vital signs in last 24 hours: Filed Vitals:   09/22/15 0907 09/22/15 1700 09/22/15 2101 09/23/15 0510  BP: 126/90 128/88 136/94 132/95  Pulse: 100 188 100 98  Temp: 98.6 F (37 C) 98.8 F (37.1 C) 98.4 F (36.9 C) 98.2 F (36.8 C)  TempSrc: Oral Oral    Resp: 18 18 18 18   Height:      Weight:      SpO2: 100% 100% 100% 100%   Weight change:  Physical Exam: General: alert Young BF NAD/pleasant Heart: RRR With Defibrillator box Lungs: CTA bilat Abdomen: BS pos. Soft ND, NT Extremities:R 2+ pedal edema/Left 1+ pedal edema Dialysis Access: pos bruit L armAVF  HD TTS last HD in [place], [place] Current dry wt ~50kg per pt Orders from May 2016 > [facility] TTS 3h 54.5kg Hep 2000 LUA AVF   Problem/Plan: 1. ESRD-  Uremia/ hyperkalemia d/t missed HD, resolved. Awaiting clip for OP HD 2. Anemia - hgb 10.5>10.0gb / no esa check with prior center for ESA med 3. Sickle cell disease/ anemia = needs PC as op  4. Secondary hyperparathyroidism - Phos ^ 8.1 on admit binder Renvela , Ckeck with [facility] for Vit D med 5. HTN/volume - bp 136/94(per pt. EDW 50 kg) 6.9 kg up by wts Not sob /some pedal edema hd for volume today 6. Hip / Pelvic pain- po pain med seems to control / admit team RX  Lenny Pastel, PA-C [facility] [phone] 09/23/2015,10:09 AM  LOS: 2 days   Pt seen, examined and agree w A/P as above.  Vinson Moselle MD [facility] pager [phone]    cell [phone] 09/23/2015, 2:42 PM    Labs: Basic Metabolic Panel:  Recent Labs Lab 09/21/15 0733 09/22/15 0547 09/23/15 0627  NA 136 138 136  K 6.2* 4.7 5.0  CL 98* 96* 95*  CO2 21* 28 26  GLUCOSE 71 83 91  BUN 109* 51* 61*  CREATININE 12.75* 7.97* 9.28*  CALCIUM 10.0 10.1 10.0  PHOS 8.1*  --   --    Liver Function Tests:  Recent Labs Lab 09/21/15 0733 09/22/15 0547   AST  --  18  ALT  --  10*  ALKPHOS  --  232*  BILITOT  --  1.0  PROT  --  7.3  ALBUMIN 3.0* 3.0*   No results for input(s): LIPASE, AMYLASE in the last 168 hours. No results for input(s): AMMONIA in the last 168 hours. CBC:  Recent Labs Lab 09/20/15 2349  09/21/15 0734 09/22/15 0547 09/23/15 0627  WBC 5.3  --  5.6 4.9 5.0  NEUTROABS 3.2  --   --   --   --   HGB 10.0*  < > 9.9* 10.5* 10.0*  HCT 31.8*  < > 31.3* 33.9* 32.7*  MCV 89.3  --  90.2 91.1 91.6  PLT 248  --  254 208 211  < > = values in this interval not displayed. Cardiac Enzymes: No results for input(s): CKTOTAL, CKMB, CKMBINDEX, TROPONINI in the last 168 hours. CBG:  Recent Labs Lab 09/21/15 0522  GLUCAP 146*    Studies/Results: No results found. Medications: . sodium chloride     . amLODipine  10 mg Oral QHS  . carvedilol  6.25 mg Oral BID  . cinacalcet  30 mg Oral Q supper  . heparin  5,000 Units Subcutaneous 3 times per  day  . isosorbide dinitrate  10 mg Oral TID  . sevelamer carbonate  2,400 mg Oral TID WC  . sodium chloride  3 mL Intravenous Q12H

## 2015-09-24 DIAGNOSIS — R0789 Other chest pain: Secondary | ICD-10-CM

## 2015-09-24 MED ORDER — HEPARIN SODIUM (PORCINE) 1000 UNIT/ML DIALYSIS
1000.0000 [IU] | INTRAMUSCULAR | Status: DC | PRN
Start: 2015-09-24 — End: 2015-09-24

## 2015-09-24 MED ORDER — DIPHENHYDRAMINE HCL 50 MG/ML IJ SOLN
INTRAMUSCULAR | Status: AC
  Filled 2015-09-24: qty 1

## 2015-09-24 MED ORDER — PENTAFLUOROPROP-TETRAFLUOROETH EX AERO: 1.0000 "application " | INHALATION_SPRAY | CUTANEOUS | Status: DC | PRN

## 2015-09-24 MED ORDER — HEPARIN SODIUM (PORCINE) 1000 UNIT/ML DIALYSIS: 2000.0000 [IU] | Freq: Once | INTRAMUSCULAR | Status: DC

## 2015-09-24 MED ORDER — LIDOCAINE-PRILOCAINE 2.5-2.5 % EX CREA
1.0000 "application " | TOPICAL_CREAM | CUTANEOUS | Status: DC | PRN
  Filled 2015-09-24: qty 5

## 2015-09-24 MED ORDER — DIPHENHYDRAMINE HCL 50 MG/ML IJ SOLN
25.0000 mg | Freq: Once | INTRAMUSCULAR | Status: AC
  Administered 2015-09-24: 25 mg via INTRAVENOUS

## 2015-09-24 MED ORDER — ALTEPLASE 2 MG IJ SOLR
2.0000 mg | Freq: Once | INTRAMUSCULAR | Status: DC | PRN
  Filled 2015-09-24: qty 2

## 2015-09-24 MED ORDER — DIPHENHYDRAMINE HCL 50 MG/ML IJ SOLN
INTRAMUSCULAR | Status: AC
Start: 2015-09-24 — End: 2015-09-24
  Administered 2015-09-24: 25 mg via INTRAVENOUS
  Filled 2015-09-24: qty 1

## 2015-09-24 MED ORDER — SODIUM CHLORIDE 0.9 % IV SOLN: 100.0000 mL | INTRAVENOUS | Status: DC | PRN

## 2015-09-24 MED ORDER — HYDROMORPHONE HCL 1 MG/ML IJ SOLN
0.5000 mg | Freq: Once | INTRAMUSCULAR | Status: AC
  Administered 2015-09-24: 0.5 mg via INTRAVENOUS
  Filled 2015-09-24: qty 1

## 2015-09-24 MED ORDER — LIDOCAINE HCL (PF) 1 % IJ SOLN: 5.0000 mL | INTRAMUSCULAR | Status: DC | PRN

## 2015-09-24 NOTE — Progress Notes (Signed)
[facility] PROGRESS NOTE  [name] Small MPN:[id number] DOB: [birth year] DOA: 09/20/2015 PCP: No PCP Per Patient  Assessment/Plan: 1. Pulm edema -from missed HD, improved -s/p Urgent HD on admission -per Renal, needs to be clipped for outpatient HD, moved from [place], [place] and no arrangements for HD made -has been declined by several local Dialysis Units due to past behaviours/non compliance -CM working on this  2. Hyperkalemia. K+ 7.0.  -due to missed HD, corrected with HD -renal diet   3. Hypertension.  -Continue home anti-hypertensives (Norvasc, Coreg, Isordil) -stable now  4. Chronic systolic heart failure / CHF, chronic. LVEF 20-25% in June 2016.  -continue home Coreg and anti-hypertensives.  -volume managed with HD  5. Chronic normocytic anemia -stable.   6. H/o sickle cell disease/chronic hip pain -FU with Ortho/PCP  DVT proph: Hep SQ  Code Status: Full Code Family Communication: none at bedside Disposition Plan: Home when CLIP process completed   Consultants:  Renal  HPI/Subjective:feels ok wondering when she can go home  Objective: Filed Vitals:   09/24/15 0900 09/24/15 1316  BP: 117/88 124/87  Pulse: 99 108  Temp: 97.6 F (36.4 C) 98.1 F (36.7 C)  Resp:  20    Intake/Output Summary (Last 24 hours) at 09/24/15 1410 Last data filed at 09/24/15 1339  Gross per 24 hour  Intake    840 ml  Output   4000 ml  Net  -3160 ml   Filed Weights   09/21/15 2053 09/23/15 1728 09/23/15 2115  Weight: 56.9 kg (125 lb 7.1 oz) 59.9 kg (132 lb 0.9 oz) 56 kg (123 lb 7.3 oz)    Exam:   General:  AAOx3  HEENT: peri-orbital edema noted  Cardiovascular: S!S2/RRR  Respiratory: CTAB  Abdomen:soft, NT, BS present  Musculoskeletal: trace edema c/c  Data Reviewed: Basic Metabolic Panel:  Recent Labs Lab 09/20/15 2349 09/21/15 0015 09/21/15 0733 09/22/15 0547 09/23/15 0627  NA 136 134* 136 138 136  K 7.0* 6.9* 6.2* 4.7 5.0  CL 97* 103  98* 96* 95*  CO2 22  --  21* 28 26  GLUCOSE 94 87 71 83 91  BUN 109* 93* 109* 51* 61*  CREATININE 12.50* 13.20* 12.75* 7.97* 9.28*  CALCIUM 9.7  --  10.0 10.1 10.0  PHOS  --   --  8.1*  --   --    Liver Function Tests:  Recent Labs Lab 09/21/15 0733 09/22/15 0547  AST  --  18  ALT  --  10*  ALKPHOS  --  232*  BILITOT  --  1.0  PROT  --  7.3  ALBUMIN 3.0* 3.0*   No results for input(s): LIPASE, AMYLASE in the last 168 hours. No results for input(s): AMMONIA in the last 168 hours. CBC:  Recent Labs Lab 09/20/15 2349 09/21/15 0015 09/21/15 0734 09/22/15 0547 09/23/15 0627  WBC 5.3  --  5.6 4.9 5.0  NEUTROABS 3.2  --   --   --   --   HGB 10.0* 11.6* 9.9* 10.5* 10.0*  HCT 31.8* 34.0* 31.3* 33.9* 32.7*  MCV 89.3  --  90.2 91.1 91.6  PLT 248  --  254 208 211   Cardiac Enzymes: No results for input(s): CKTOTAL, CKMB, CKMBINDEX, TROPONINI in the last 168 hours. BNP (last 3 results)  Recent Labs  06/29/15 1615  BNP >4500.0*    ProBNP (last 3 results) No results for input(s): PROBNP in the last 8760 hours.  CBG:  Recent Labs Lab 09/21/15 0522  GLUCAP 146*    Recent Results (from the past 240 hour(s))  MRSA PCR Screening     Status: None   Collection Time: 09/21/15  7:07 AM  Result Value Ref Range Status   MRSA by PCR NEGATIVE NEGATIVE Final    Comment:        The GeneXpert MRSA Assay (FDA approved for NASAL specimens only), is one component of a comprehensive MRSA colonization surveillance program. It is not intended to diagnose MRSA infection nor to guide or monitor treatment for MRSA infections.      Studies: No results found.  Scheduled Meds: . amLODipine  10 mg Oral QHS  . carvedilol  6.25 mg Oral BID  . cinacalcet  30 mg Oral Q supper  . heparin  5,000 Units Subcutaneous 3 times per day  . isosorbide dinitrate  10 mg Oral TID  . sevelamer carbonate  2,400 mg Oral TID WC  . sodium chloride  3 mL Intravenous Q12H   Continuous  Infusions: . sodium chloride     Antibiotics Given (last 72 hours)    None      Active Problems:   ESRD (end stage renal disease) on dialysis (HCC)   HTN (hypertension)   Systolic CHF, chronic (HCC)   Hyperkalemia   Chest pain   ESRD (end stage renal disease) (HCC)    Time spent:    [facility]  [facility] Pager [phone]. If 7PM-7AM, please contact night-coverage at [email or web address], password [facility] 09/24/2015, 2:10 PM  LOS: 3 days

## 2015-09-24 NOTE — Progress Notes (Signed)
Pt back from HD in NAD, pain at 8/10 with oxy10mg  given.  Pt caught up on meds and is sitting on side of bed eating. vss.

## 2015-09-24 NOTE — Progress Notes (Signed)
Subjective:  HD yest to 56kg, 4kg off  Objective Vital signs in last 24 hours: Filed Vitals:   09/23/15 2115 09/23/15 2237 09/24/15 0500 09/24/15 0900  BP: 118/84 139/99 126/83 117/88  Pulse: 96 86 87 99  Temp:  97.8 F (36.6 C) 98 F (36.7 C) 97.6 F (36.4 C)  TempSrc:  Oral Oral Oral  Resp: 18 18 16    Height:      Weight: 56 kg (123 lb 7.3 oz)     SpO2:  100% 100% 100%   Weight change:  Physical Exam: General: alert Young BF NAD/pleasant Heart: RRRwith Defibrillator box Lungs: CTA bilat Abdomen: BS pos. Soft ND, NT Extremities:R 2+ pedal edema/Left 1+ pedal edema Dialysis Access: pos bruit L armAVF  HD TTS last HD in [place], [place] Current dry wt ~50kg per pt Orders from May 2016 > [facility] TTS 3h 54.5kg Hep 2000 LUA AVF   Problems: 1. ESRD-  uremia/ hyperkalemia d/t missed HD, resolved. Awaiting clip for OP HD 2. Anemia - hgb 10.5>10.0gb / no esa check with prior center for ESA med 3. 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 HTN/volume - still 6 kg over 6. Hip / Pelvic pain- po pain med seems to control / admit team RX  Plan - HD today, get vol down.  Patient has been declined by the [facility] outpatient HD units in [place].  There is a pending application to the [facility] group but I'm not optimistic.  Patient wants to go home. I've told her that attempts for placement into the outside HD units can only be done (1) by her home unit in [place], or (2) by the hospital while she is an active inpatient.  If she decides to go home and doesn't have an outpatient unit, and plans not to return to her home unit in [place], she can return to the hospital here for dialysis.  The protocol is basically she would go to the ER and say she is here to request inpatient dialysis and then we would get her dialyzed and, assuming there is no reason for admission, she would  be dc'd home afterwards.   Vinson Moselle MD [facility] pager [phone]    cell [phone] 09/24/2015, 12:03 PM    Labs: Basic Metabolic Panel:  Recent Labs Lab 09/21/15 0733 09/22/15 0547 09/23/15 0627  NA 136 138 136  K 6.2* 4.7 5.0  CL 98* 96* 95*  CO2 21* 28 26  GLUCOSE 71 83 91  BUN 109* 51* 61*  CREATININE 12.75* 7.97* 9.28*  CALCIUM 10.0 10.1 10.0  PHOS 8.1*  --   --    Liver Function Tests:  Recent Labs Lab 09/21/15 0733 09/22/15 0547  AST  --  18  ALT  --  10*  ALKPHOS  --  232*  BILITOT  --  1.0  PROT  --  7.3  ALBUMIN 3.0* 3.0*   No results for input(s): LIPASE, AMYLASE in the last 168 hours. No results for input(s): AMMONIA in the last 168 hours. CBC:  Recent Labs Lab 09/20/15 2349  09/21/15 0734 09/22/15 0547 09/23/15 0627  WBC 5.3  --  5.6 4.9 5.0  NEUTROABS 3.2  --   --   --   --   HGB 10.0*  < > 9.9* 10.5* 10.0*  HCT 31.8*  < >  31.3* 33.9* 32.7*  MCV 89.3  --  90.2 91.1 91.6  PLT 248  --  254 208 211  < > = values in this interval not displayed. Cardiac Enzymes: No results for input(s): CKTOTAL, CKMB, CKMBINDEX, TROPONINI in the last 168 hours. CBG:  Recent Labs Lab 09/21/15 0522  GLUCAP 146*    Studies/Results: No results found. Medications: . sodium chloride     . amLODipine  10 mg Oral QHS  . carvedilol  6.25 mg Oral BID  . cinacalcet  30 mg Oral Q supper  . heparin  5,000 Units Subcutaneous 3 times per day  . isosorbide dinitrate  10 mg Oral TID  . sevelamer carbonate  2,400 mg Oral TID WC  . sodium chloride  3 mL Intravenous Q12H

## 2015-09-24 NOTE — Care Management Note (Signed)
Case Management Note  Patient Details  Name: [name] Small MRN: [id number] Date of Birth: December 20, 1990  Subjective/Objective:     Pt has been delivered RW at bedside by [facility]               Action/Plan:   Expected Discharge Date:                  Expected Discharge Plan:  Home/Self Care  In-House Referral:  Clinical Social Work  Discharge planning Services  CM Consult  Post Acute Care Choice:  Durable Medical Equipment Choice offered to:  Patient  DME Arranged:  [place] rolling DME Agency:  [facility]  HH Arranged:    HH Agency:     Status of Service:  In process, will continue to follow  Medicare Important Message Given:    Date Medicare IM Given:    Medicare IM give by:    Date Additional Medicare IM Given:    Additional Medicare Important Message give by:     If discussed at Long Length of Stay Meetings, dates discussed:    Additional Comments:  Yvone Neu, RN 09/24/2015, 2:13 PM

## 2015-09-24 NOTE — Care Management Important Message (Signed)
Important Message  Patient Details  Name: [name] Small MRN: [id number] Date of Birth: 11-10-1990   Medicare Important Message Given:  Yes    Argil Mahl P Juliza Machnik 09/24/2015, 2:23 PM

## 2015-09-25 LAB — RENAL FUNCTION PANEL
Albumin: 2.9 g/dL — ABNORMAL LOW (ref 3.5–5.0)
Anion gap: 11 (ref 5–15)
BUN: 23 mg/dL — ABNORMAL HIGH (ref 6–20)
CHLORIDE: 99 mmol/L — AB (ref 101–111)
CO2: 26 mmol/L (ref 22–32)
CREATININE: 4.57 mg/dL — AB (ref 0.44–1.00)
Calcium: 9.6 mg/dL (ref 8.9–10.3)
GFR, EST AFRICAN AMERICAN: 14 mL/min — AB (ref >60)
GFR, EST NON AFRICAN AMERICAN: 12 mL/min — AB (ref >60)
Glucose, Bld: 87 mg/dL (ref 65–99)
PHOSPHORUS: 4.1 mg/dL (ref 2.5–4.6)
Potassium: 4 mmol/L (ref 3.5–5.1)
Sodium: 136 mmol/L (ref 135–145)

## 2015-09-25 LAB — CBC
HCT: 32.8 % — ABNORMAL LOW (ref 36.0–46.0)
Hemoglobin: 10.1 g/dL — ABNORMAL LOW (ref 12.0–15.0)
MCH: 28.3 pg (ref 26.0–34.0)
MCHC: 30.8 g/dL (ref 30.0–36.0)
MCV: 91.9 fL (ref 78.0–100.0)
PLATELETS: 153 10*3/uL (ref 150–400)
RBC: 3.57 MIL/uL — AB (ref 3.87–5.11)
RDW: 19.4 % — ABNORMAL HIGH (ref 11.5–15.5)
WBC: 4.8 10*3/uL (ref 4.0–10.5)

## 2015-09-25 MED ORDER — SEVELAMER CARBONATE 800 MG PO TABS: 2400.0000 mg | ORAL_TABLET | Freq: Three times a day (TID) | ORAL | Status: DC

## 2015-09-25 MED ORDER — DARBEPOETIN ALFA 100 MCG/0.5ML IJ SOSY
PREFILLED_SYRINGE | INTRAMUSCULAR | Status: AC
  Filled 2015-09-25: qty 0.5

## 2015-09-25 MED ORDER — OXYCODONE HCL 10 MG PO TABS: 10.0000 mg | ORAL_TABLET | Freq: Three times a day (TID) | ORAL | Status: DC | PRN

## 2015-09-25 MED ORDER — DARBEPOETIN ALFA 100 MCG/0.5ML IJ SOSY: 100.0000 ug | PREFILLED_SYRINGE | INTRAMUSCULAR | Status: DC

## 2015-09-25 MED ORDER — CARVEDILOL 6.25 MG PO TABS: 6.2500 mg | ORAL_TABLET | Freq: Two times a day (BID) | ORAL | Status: DC

## 2015-09-25 MED ORDER — SENNA 8.6 MG PO TABS: 1.0000 | ORAL_TABLET | Freq: Every day | ORAL | Status: DC

## 2015-09-25 MED ORDER — DIPHENHYDRAMINE HCL 50 MG/ML IJ SOLN
12.5000 mg | Freq: Once | INTRAMUSCULAR | Status: AC
  Administered 2015-09-25: 12.5 mg via INTRAVENOUS

## 2015-09-25 MED ORDER — HYDROMORPHONE HCL 1 MG/ML IJ SOLN
1.0000 mg | Freq: Once | INTRAMUSCULAR | Status: AC
  Administered 2015-09-25: 1 mg via INTRAVENOUS
  Filled 2015-09-25: qty 1

## 2015-09-25 MED ORDER — DARBEPOETIN ALFA 100 MCG/0.5ML IJ SOSY
100.0000 ug | PREFILLED_SYRINGE | INTRAMUSCULAR | Status: DC
  Administered 2015-09-25: 100 ug via INTRAVENOUS

## 2015-09-25 MED ORDER — RENA-VITE PO TABS: 1.0000 | ORAL_TABLET | Freq: Every day | ORAL | Status: DC

## 2015-09-25 MED ORDER — OXYCODONE HCL 5 MG PO TABS
ORAL_TABLET | ORAL | Status: AC
  Filled 2015-09-25: qty 2

## 2015-09-25 MED ORDER — AMLODIPINE BESYLATE 10 MG PO TABS: 10.0000 mg | ORAL_TABLET | Freq: Every day | ORAL | Status: DC

## 2015-09-25 MED ORDER — ISOSORBIDE DINITRATE 10 MG PO TABS
10.0000 mg | ORAL_TABLET | Freq: Three times a day (TID) | ORAL | Status: DC
Start: 2015-09-25 — End: 2016-01-04

## 2015-09-25 MED ORDER — CINACALCET HCL 30 MG PO TABS
30.0000 mg | ORAL_TABLET | Freq: Every day | ORAL | Status: DC
Start: 2015-09-25 — End: 2016-01-04

## 2015-09-25 MED ORDER — DIPHENHYDRAMINE HCL 50 MG/ML IJ SOLN
INTRAMUSCULAR | Status: AC
  Filled 2015-09-25: qty 1

## 2015-09-25 NOTE — Discharge Summary (Signed)
Physician Discharge Summary  [name] Small MHD:[id number] DOB: 1991/10/14 DOA: 09/20/2015  PCP: No PCP Per Patient  Admit date: 09/20/2015 Discharge date: 09/25/2015  Time spent: 45 minutes  Left AMA  Recommendations for Outpatient Follow-up:  Unable to find Outpatient Dialysis Center that will accept her and hence may come to ER HD TTS   Discharge Diagnoses:   Pulm edema  Chronic hip pain  Sickle cell disease   ESRD (end stage renal disease) on dialysis (HCC)   HTN (hypertension)   Systolic CHF, chronic (HCC)   Hyperkalemia   Chest pain   ESRD (end stage renal disease) (HCC)   Discharge Condition:stable  Diet recommendation: Renal  Filed Weights   09/23/15 2115 09/24/15 1501 09/24/15 1830  Weight: 56 kg (123 lb 7.3 oz) 57.4 kg (126 lb 8.7 oz) 53.4 kg (117 lb 11.6 oz)    History of present illness:  CHIEF COMPLAINT: chest pain  HPI: [name] Small is a 24 y.o. female With multiple medical problems not limited to HTN, pulmonary embolism, chronic systolic CHF, defibrillator placement and ESRD on HD. Patient presented to [facility] ED 11/29 night with chest pain. She had not dialyzed in a week (last time was on Tuesday in [place]). Patient hyperkalemic, no EKG changes. Her chest pain was mid sternal, non-radiating, worse with deep breaths  Hospital Course:  1. Pulm edema -from missed HD x 1 week, improved -s/p Urgent HD on admission -per Renal, needs to be clipped for outpatient HD, moved from [place], [place] and no arrangements for HD made -has been declined by several local Dialysis Units due to past behaviours/non compliance -CM working on this, not been accepted to Outpatient HD center yet, left AMA-since not been accepted anywhere for HD thus far  2. Hyperkalemia. K+ 7.0.  -due to missed HD, corrected with HD -renal diet   3. Hypertension.  -Continue home anti-hypertensives (Norvasc, Coreg, Isordil) -stable now  4. Chronic systolic heart failure / CHF,  chronic. LVEF 20-25% in June 2016.  -continue home Coreg and anti-hypertensives.  -volume managed with HD  5. Chronic normocytic anemia -stable.   6. H/o sickle cell disease/chronic hip pain -FU with Ortho/PCP   Consultations:  Renal  Discharge Exam: Filed Vitals:   09/25/15 0500 09/25/15 0815  BP: 117/80 116/83  Pulse: 98 99  Temp: 98.2 F (36.8 C) 98.3 F (36.8 C)  Resp: 18 17    General: AAOx3 Cardiovascular: S1S2/RRR Respiratory: CTAB  Discharge Instructions    Current Discharge Medication List    START taking these medications   Details  senna (SENOKOT) 8.6 MG TABS tablet Take 1 tablet (8.6 mg total) by mouth daily. Qty: 30 each, Refills: 0      CONTINUE these medications which have CHANGED   Details  amLODipine (NORVASC) 10 MG tablet Take 1 tablet (10 mg total) by mouth at bedtime. Qty: 30 tablet, Refills: 1    carvedilol (COREG) 6.25 MG tablet Take 1 tablet (6.25 mg total) by mouth 2 (two) times daily. Qty: 60 tablet, Refills: 0    cinacalcet (SENSIPAR) 30 MG tablet Take 1 tablet (30 mg total) by mouth daily with supper. Qty: 30 tablet, Refills: 0    isosorbide dinitrate (ISORDIL) 10 MG tablet Take 1 tablet (10 mg total) by mouth 3 (three) times daily. Qty: 90 tablet, Refills: 0    Oxycodone HCl 10 MG TABS Take 1 tablet (10 mg total) by mouth every 8 (eight) hours as needed. For pain Qty: 40 tablet, Refills: 0  sevelamer carbonate (RENVELA) 800 MG tablet Take 3 tablets (2,400 mg total) by mouth 3 (three) times daily with meals. Qty: 120 tablet, Refills: 1      CONTINUE these medications which have NOT CHANGED   Details  LORazepam (ATIVAN) 1 MG tablet Take 1 mg by mouth daily. Patient states she takes one tablet by mouth every day per patient      STOP taking these medications     oxyCODONE-acetaminophen (PERCOCET/ROXICET) 5-325 MG per tablet        Allergies  Allergen Reactions  . Tramadol Anaphylaxis  . Iohexol Itching  .  Morphine And Related Itching    Ok with oxycodone  . Vicodin [Hydrocodone-Acetaminophen] Hives   Follow-up Information    Follow up with Inc[facility] - [facility].   Why:  RW has been provided   Contact information:   [address] [place] [address] [phone]        The results of significant diagnostics from this hospitalization (including imaging, microbiology, ancillary and laboratory) are listed below for reference.    Significant Diagnostic Studies: Dg Chest Port 1 View  09/20/2015  CLINICAL DATA:  Midchest pain since early this morning. EXAM: PORTABLE CHEST 1 VIEW COMPARISON:  06/29/2015 FINDINGS: There is marked cardiomegaly, unchanged. There are intact appearances of the transvenous cardiac lead. The lungs are clear except for minor unchanged curvilinear scarring in the bases. No large effusions. No pneumothorax. IMPRESSION: Stable cardiomegaly.  No acute cardiopulmonary findings. Electronically Signed   By: Ellery Plunk M.D.   On: 09/20/2015 21:44    Microbiology: Recent Results (from the past 240 hour(s))  MRSA PCR Screening     Status: None   Collection Time: 09/21/15  7:07 AM  Result Value Ref Range Status   MRSA by PCR NEGATIVE NEGATIVE Final    Comment:        The GeneXpert MRSA Assay (FDA approved for NASAL specimens only), is one component of a comprehensive MRSA colonization surveillance program. It is not intended to diagnose MRSA infection nor to guide or monitor treatment for MRSA infections.      Labs: Basic Metabolic Panel:  Recent Labs Lab 09/20/15 2349 09/21/15 0015 09/21/15 0733 09/22/15 0547 09/23/15 0627  NA 136 134* 136 138 136  K 7.0* 6.9* 6.2* 4.7 5.0  CL 97* 103 98* 96* 95*  CO2 22  --  21* 28 26  GLUCOSE 94 87 71 83 91  BUN 109* 93* 109* 51* 61*  CREATININE 12.50* 13.20* 12.75* 7.97* 9.28*  CALCIUM 9.7  --  10.0 10.1 10.0  PHOS  --   --  8.1*  --   --    Liver Function Tests:  Recent Labs Lab  09/21/15 0733 09/22/15 0547  AST  --  18  ALT  --  10*  ALKPHOS  --  232*  BILITOT  --  1.0  PROT  --  7.3  ALBUMIN 3.0* 3.0*   No results for input(s): LIPASE, AMYLASE in the last 168 hours. No results for input(s): AMMONIA in the last 168 hours. CBC:  Recent Labs Lab 09/20/15 2349 09/21/15 0015 09/21/15 0734 09/22/15 0547 09/23/15 0627  WBC 5.3  --  5.6 4.9 5.0  NEUTROABS 3.2  --   --   --   --   HGB 10.0* 11.6* 9.9* 10.5* 10.0*  HCT 31.8* 34.0* 31.3* 33.9* 32.7*  MCV 89.3  --  90.2 91.1 91.6  PLT 248  --  254 208  211   Cardiac Enzymes: No results for input(s): CKTOTAL, CKMB, CKMBINDEX, TROPONINI in the last 168 hours. BNP: BNP (last 3 results)  Recent Labs  06/29/15 1615  BNP >4500.0*    ProBNP (last 3 results) No results for input(s): PROBNP in the last 8760 hours.  CBG:  Recent Labs Lab 09/21/15 0522  GLUCAP 146*       Signed:  Qusay Villada  [facility] 09/25/2015, 11:40 AM

## 2015-09-25 NOTE — Progress Notes (Signed)
Patient left AMA.

## 2015-09-25 NOTE — Progress Notes (Signed)
Subjective:  No co [facility] today  Objective Vital signs in last 24 hours: Filed Vitals:   09/24/15 1855 09/24/15 2100 09/25/15 0500 09/25/15 0815  BP: 123/94 124/90 117/80 116/83  Pulse: 102 104 98 99  Temp: 97.7 F (36.5 C) 98.3 F (36.8 C) 98.2 F (36.8 C) 98.3 F (36.8 C)  TempSrc: Oral Oral Oral Oral  Resp: Height:      Weight:      SpO2: 100% 100% 100% 95%   Weight change: -2.5 kg (-5 lb 8.2 oz) Physical Exam: General: alert Young BF NAD/pleasant Heart: RRRwith Defibrillator box Lungs: CTA bilat Abdomen: BS pos. Soft ND, NT Extremities= Lower etrem  Edema vastly improved  Now trace bipedal  edema Dialysis Access: pos bruit L armAVF    OP HD=TTS last HD in [place], [place] Current dry wt ~50kg per pt Orders from May 2016 > [facility] TTS 3h 54.5kg Hep 2000 LUA AVF   Problem/Plan: 1. ESRD- uremia/ hyperkalemia d/t missed HD, resolved. Awaiting clip for OP HD schedule / hd yest 4 l uf / hd today for TTS schedule ?dc  See below 2. Anemia - hgb 10.5>10.0gb / no esa / give ESA  Today if HGB still <12 3. 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 HTN/volume - 4 cc uf yesterday and pedal edema resolved  With HD last 2 days/ 1kg below edw today 6. Hip / Pelvic pain- po pain med seems to control / admit team RX  7.      Disposition - As noted Yesterday by Dr. Arlean Hopping " Patient has been declined by the [facility] outpatient HD units in [place]. There is a pending application to the [employer] group. Patient wants to go home. I've told her that attempts for placement into the outside HD units can only be done (1) by her home unit in [place], or (2) by the hospital while she is an active inpatient. If she decides to go home and doesn't have an outpatient unit, and plans not to return to her home unit  in [place], she can return to the hospital here for dialysis. The protocol is basically she would go to the ER and say she is here to request inpatient dialysis and then we would get her dialyzed and, assuming there is no reason for admission, she would be dc'd home afterwards."  Lenny Pastel, PA-C [facility] [phone] 09/25/2015,9:23 AM  LOS: 4 days   Pt seen, examined and agree w A/P as above.  Vinson Moselle MD [facility] pager [phone]    cell [phone] 09/25/2015, 12:40 PM    Labs: Basic Metabolic Panel:  Recent Labs Lab 09/21/15 0733 09/22/15 0547 09/23/15 0627  NA 136 138 136  K 6.2* 4.7 5.0  CL 98* 96* 95*  CO2 21* 28 26  GLUCOSE 71 83 91  BUN 109* 51* 61*  CREATININE 12.75* 7.97* 9.28*  CALCIUM 10.0 10.1 10.0  PHOS 8.1*  --   --    Liver Function Tests:  Recent Labs Lab 09/21/15 0733 09/22/15 0547  AST  --  18  ALT  --  10*  ALKPHOS  --  232*  BILITOT  --  1.0  PROT  --  7.3  ALBUMIN 3.0* 3.0*   No results for input(s): LIPASE, AMYLASE in the last 168 hours. No results for input(s): AMMONIA in the last 168 hours. CBC:  Recent Labs Lab 09/20/15 2349  09/21/15 0734 09/22/15 0547 09/23/15 0627  WBC 5.3  --  5.6 4.9 5.0  NEUTROABS 3.2  --   --   --   --   HGB 10.0*  < > 9.9* 10.5* 10.0*  HCT 31.8*  < > 31.3* 33.9* 32.7*  MCV 89.3  --  90.2 91.1 91.6  PLT 248  --  254 208 211  < > = values in this interval not displayed. Cardiac Enzymes: No results for input(s): CKTOTAL, CKMB, CKMBINDEX, TROPONINI in the last 168 hours. CBG:  Recent Labs Lab 09/21/15 0522  GLUCAP 146*    Studies/Results: No results found. Medications: . sodium chloride     . amLODipine  10 mg Oral QHS  . carvedilol  6.25 mg Oral BID  . cinacalcet  30 mg Oral Q supper  . heparin  5,000 Units Subcutaneous 3 times per day  . isosorbide dinitrate  10 mg Oral TID  . sevelamer carbonate  2,400 mg Oral TID WC  . sodium chloride   3 mL Intravenous Q12H

## 2015-09-29 ENCOUNTER — Encounter (HOSPITAL_COMMUNITY): Payer: Self-pay | Admitting: Emergency Medicine

## 2015-09-29 ENCOUNTER — Emergency Department (HOSPITAL_COMMUNITY)
Admission: EM | Admit: 2015-09-29 | Discharge: 2015-09-29 | Disposition: A | Discharge status: Home / Self Care | Payer: Medicare Other | Attending: Emergency Medicine | Admitting: Emergency Medicine

## 2015-09-29 ENCOUNTER — Emergency Department (HOSPITAL_COMMUNITY): Payer: Medicare Other

## 2015-09-29 DIAGNOSIS — Z872 Personal history of diseases of the skin and subcutaneous tissue: Secondary | ICD-10-CM | POA: Insufficient documentation

## 2015-09-29 DIAGNOSIS — Z79899 Other long term (current) drug therapy: Secondary | ICD-10-CM | POA: Diagnosis not present

## 2015-09-29 DIAGNOSIS — Q613 Polycystic kidney, unspecified: Secondary | ICD-10-CM | POA: Insufficient documentation

## 2015-09-29 DIAGNOSIS — Z86711 Personal history of pulmonary embolism: Secondary | ICD-10-CM | POA: Insufficient documentation

## 2015-09-29 DIAGNOSIS — I509 Heart failure, unspecified: Secondary | ICD-10-CM | POA: Diagnosis not present

## 2015-09-29 DIAGNOSIS — R079 Chest pain, unspecified: Secondary | ICD-10-CM | POA: Diagnosis present

## 2015-09-29 DIAGNOSIS — F17210 Nicotine dependence, cigarettes, uncomplicated: Secondary | ICD-10-CM | POA: Insufficient documentation

## 2015-09-29 DIAGNOSIS — Z8719 Personal history of other diseases of the digestive system: Secondary | ICD-10-CM | POA: Diagnosis not present

## 2015-09-29 DIAGNOSIS — Z992 Dependence on renal dialysis: Secondary | ICD-10-CM | POA: Diagnosis not present

## 2015-09-29 DIAGNOSIS — Z8659 Personal history of other mental and behavioral disorders: Secondary | ICD-10-CM | POA: Insufficient documentation

## 2015-09-29 DIAGNOSIS — N186 End stage renal disease: Secondary | ICD-10-CM | POA: Insufficient documentation

## 2015-09-29 DIAGNOSIS — G8929 Other chronic pain: Secondary | ICD-10-CM | POA: Diagnosis not present

## 2015-09-29 DIAGNOSIS — Z8639 Personal history of other endocrine, nutritional and metabolic disease: Secondary | ICD-10-CM | POA: Insufficient documentation

## 2015-09-29 DIAGNOSIS — Z862 Personal history of diseases of the blood and blood-forming organs and certain disorders involving the immune mechanism: Secondary | ICD-10-CM | POA: Insufficient documentation

## 2015-09-29 DIAGNOSIS — R011 Cardiac murmur, unspecified: Secondary | ICD-10-CM | POA: Insufficient documentation

## 2015-09-29 DIAGNOSIS — I120 Hypertensive chronic kidney disease with stage 5 chronic kidney disease or end stage renal disease: Secondary | ICD-10-CM | POA: Diagnosis not present

## 2015-09-29 LAB — CBC
HEMATOCRIT: 38.7 % (ref 36.0–46.0)
Hemoglobin: 12.2 g/dL (ref 12.0–15.0)
MCH: 29.3 pg (ref 26.0–34.0)
MCHC: 31.5 g/dL (ref 30.0–36.0)
MCV: 93 fL (ref 78.0–100.0)
PLATELETS: 168 10*3/uL (ref 150–400)
RBC: 4.16 MIL/uL (ref 3.87–5.11)
RDW: 19.9 % — AB (ref 11.5–15.5)
WBC: 5.2 10*3/uL (ref 4.0–10.5)

## 2015-09-29 LAB — BASIC METABOLIC PANEL
Anion gap: 17 — ABNORMAL HIGH (ref 5–15)
BUN: 62 mg/dL — AB (ref 6–20)
CALCIUM: 10.1 mg/dL (ref 8.9–10.3)
CO2: 22 mmol/L (ref 22–32)
CREATININE: 9.47 mg/dL — AB (ref 0.44–1.00)
Chloride: 100 mmol/L — ABNORMAL LOW (ref 101–111)
GFR calc Af Amer: 6 mL/min — ABNORMAL LOW (ref >60)
GFR, EST NON AFRICAN AMERICAN: 5 mL/min — AB (ref >60)
GLUCOSE: 82 mg/dL (ref 65–99)
POTASSIUM: 5 mmol/L (ref 3.5–5.1)
SODIUM: 139 mmol/L (ref 135–145)

## 2015-09-29 LAB — I-STAT TROPONIN, ED: TROPONIN I, POC: 0.1 ng/mL — AB (ref 0.00–0.08)

## 2015-09-29 LAB — BRAIN NATRIURETIC PEPTIDE: B Natriuretic Peptide: 2492.9 pg/mL — ABNORMAL HIGH (ref 0.0–100.0)

## 2015-09-29 MED ORDER — FENTANYL CITRATE (PF) 100 MCG/2ML IJ SOLN
100.0000 ug | Freq: Once | INTRAMUSCULAR | Status: AC
  Administered 2015-09-29: 100 ug via INTRAVENOUS
  Filled 2015-09-29: qty 2

## 2015-09-29 MED ORDER — DIPHENHYDRAMINE HCL 50 MG/ML IJ SOLN
25.0000 mg | Freq: Once | INTRAMUSCULAR | Status: AC
  Administered 2015-09-29: 25 mg via INTRAVENOUS
  Filled 2015-09-29: qty 1

## 2015-09-29 NOTE — Procedures (Signed)
Patient signed off AMA 30 prior to treatment end despite education regarding fluid volume excess and need for dialysis. Needles pulled and pressure held x 10 minutes on both sites. Peripheral IV removed. Patient escorted to ED entrance for discharge. Thayer Ohm, RN, BSN 09/29/2015 10:03 PM

## 2015-09-29 NOTE — ED Notes (Signed)
Patient transported to X-ray 

## 2015-09-29 NOTE — ED Notes (Addendum)
Pt sts central CP worse with inspiration x 2 days; pt sts hx of sickle cell; pt missed dialysis yesterday

## 2015-09-29 NOTE — ED Notes (Signed)
RN unable to obtain IV access/blood work, will have another RN attempt.

## 2015-09-29 NOTE — ED Notes (Signed)
Iv able to obtain access, unable to get blood work, Phlebotomy to attempt.

## 2015-09-29 NOTE — Discharge Planning (Signed)
NCM confirmed with [name]) that all attempts to place pt in HD Centers in and around the surrounding areas have been unsuccessful.  Pt advised to write letter of plea to HD centers asking for another chance but until accepted, pt will have to come to ED for HD needs.

## 2015-09-29 NOTE — ED Provider Notes (Signed)
CSN: [id number]     Arrival date & time 09/29/15  7414 History   First MD Initiated Contact with Patient 09/29/15 1055     Chief Complaint  Patient presents with  . Chest Pain    HPI Patient is a 24 y/o AAF with a PMH of ESRD on dialysis (last dialysis 12/03), sickle cell anemia, and CHFrEF who presents with sudden onset pleuritic chest pain that began last night.  Pain has progressively worsened and is unrelieved by percocet.  Pain is constant, sharp in character, and is worsened when in the supine position. Pain is associated with intermittent diaphoresis and SOB. She rates the pain as 9/10. She has never had a sickle cell pain crisis. Her last episode of chest pain was 2 weeks ago following failure to undergo dialysis for one week.  At that time she was admitted for pulmonary edema and hyperkalemia treated with dialysis. She has note received dialysis since her discharge.   Past Medical History  Diagnosis Date  . Hemodialysis patient (HCC)   . Hypertension   . Pulmonary emboli (HCC) 01/2012    Bilateral, moderate clot burden, areas of pulmonary infarction and central necrosis  . CHF (congestive heart failure) (HCC)   . Cardiomyopathy   . Dysrhythmia     at times per pt.  . Anemia   . H/O transfusion of packed red blood cells   . 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 Polycystic kidney disease   . Cellulitis and abscess of face 03/22/2013  . Renal insufficiency   . Sickle cell anemia (HCC)   . Chronic pain   . GERD (gastroesophageal reflux disease)   . Depression    Past Surgical History  Procedure Laterality Date  . Nephrectomy    . Av fistula placement    . Kidney transplant  2008    failed  . Tonsillectomy      as a child.  . Adenoidectomy    . Incision and drainage abscess Right 03/21/2013    Procedure: INCISION AND DRAINAGE RIGHT CHEEK ABSCESS REMOVAL OF FOREIGN  BODY;  Surgeon: Serena Colonel, MD;  Location: [facility];  Service: ENT;  Laterality: Right;  . Implantable cardioverter defibrillator implant Right 12/2014   Family History  Problem Relation Age of Onset  . Polycystic kidney disease Father    Social History  Substance Use Topics  . Smoking status: Current Every Day Smoker -- 0.20 packs/day    Types: Cigarettes  . Smokeless tobacco: Never Used  . Alcohol Use: No   OB History    No data available     Review of Systems All other systems negative except as documented in the HPI. All pertinent positives and negatives as reviewed in the HPI.   Allergies  Tramadol; Iohexol; Morphine and related; and Vicodin  Home Medications   Prior to Admission medications   Medication Sig Start Date End Date Taking? Authorizing Provider  amLODipine (NORVASC) 10 MG tablet Take 1 tablet (10 mg total) by mouth at bedtime. 09/25/15  Yes Zannie Cove, MD  carvedilol (COREG) 6.25 MG tablet Take 1 tablet (6.25 mg total) by mouth 2 (two) times daily. 09/25/15  Yes Zannie Cove, MD  cinacalcet (SENSIPAR) 30 MG tablet Take 1 tablet (30 mg total) by mouth daily with supper. 09/25/15  Yes Zannie Cove, MD  isosorbide dinitrate (ISORDIL) 10 MG tablet Take 1 tablet (10 mg total)  by mouth 3 (three) times daily. 09/25/15  Yes Zannie Cove, MD  LORazepam (ATIVAN) 1 MG tablet Take 1 mg by mouth daily. Patient states she takes one tablet by mouth every day per patient   Yes Historical Provider, MD  Oxycodone HCl 10 MG TABS Take 1 tablet (10 mg total) by mouth every 8 (eight) hours as needed. For pain 09/25/15  Yes Zannie Cove, MD  senna (SENOKOT) 8.6 MG TABS tablet Take 1 tablet (8.6 mg total) by mouth daily. 09/25/15  Yes Zannie Cove, MD  sevelamer carbonate (RENVELA) 800 MG tablet Take 3 tablets (2,400 mg total) by mouth 3 (three) times daily with meals. 09/25/15 09/24/16 Yes Zannie Cove, MD   BP 130/101 mmHg  Pulse 96  Temp(Src) 98 F (36.7 C) (Oral)  Resp 19   Ht  (1.6 m)  Wt 53 kg  BMI 20.70 kg/m2  SpO2 100%   Physical Exam  Constitutional: She is oriented to person, place, and time. She appears well-developed and well-nourished. No distress.  HENT:  Head: Normocephalic and atraumatic.  Mouth/Throat: Oropharynx is clear and moist.  Eyes: Pupils are equal, round, and reactive to light.  Cardiovascular: Normal rate and regular rhythm.  Exam reveals no friction rub.   Murmur heard. Pulmonary/Chest: Effort normal and breath sounds normal. She has no wheezes. She has no rales.  Neurological: She is alert and oriented to person, place, and time. She exhibits normal muscle tone. Coordination normal.  Skin: Skin is warm and dry. She is not diaphoretic.  Nursing note and vitals reviewed.   ED Course  Procedures (including critical care time) Labs Review Labs Reviewed  BASIC METABOLIC PANEL - Abnormal; Notable for the following:    Chloride 100 (*)    BUN 62 (*)    Creatinine, Ser 9.47 (*)    GFR calc non Af Amer 5 (*)    GFR calc Af Amer 6 (*)    Anion gap 17 (*)    All other components within normal limits  CBC - Abnormal; Notable for the following:    RDW 19.9 (*)    All other components within normal limits  BRAIN NATRIURETIC PEPTIDE - Abnormal; Notable for the following:    B Natriuretic Peptide 2492.9 (*)    All other components within normal limits  I-STAT TROPOININ, ED - Abnormal; Notable for the following:    Troponin i, poc 0.10 (*)    All other components within normal limits    Imaging Review Dg Chest 2 View  09/29/2015  CLINICAL DATA:  Chest tightness EXAM: CHEST  2 VIEW COMPARISON:  09/20/2015 FINDINGS: Chronic cardiomegaly. There is a single chamber pacer/ ICD to the right ventricle. There is no edema, consolidation, effusion, or pneumothorax. Symmetric nipple shadows noted. IVC filter noted. IMPRESSION: Chronic cardiomegaly without failure or other acute finding. Electronically Signed   By: Marnee Spring M.D.    On: 09/29/2015 12:28   I have personally reviewed and evaluated these images and lab results as part of my medical decision-making.   EKG Interpretation None       Spoke with Dr.Webb with nephrology who will dialyze the patient    Charlestine Night, PA-C 09/29/15 1526  Arby Barrette, MD 10/07/15 [phone]

## 2015-10-01 ENCOUNTER — Emergency Department (HOSPITAL_BASED_OUTPATIENT_CLINIC_OR_DEPARTMENT_OTHER): Payer: Medicare Other

## 2015-10-01 ENCOUNTER — Emergency Department (HOSPITAL_BASED_OUTPATIENT_CLINIC_OR_DEPARTMENT_OTHER)
Admission: EM | Admit: 2015-10-01 | Discharge: 2015-10-01 | Disposition: A | Discharge status: Home / Self Care | Payer: Medicare Other | Source: Home / Self Care | Attending: Emergency Medicine | Admitting: Emergency Medicine

## 2015-10-01 ENCOUNTER — Encounter (HOSPITAL_BASED_OUTPATIENT_CLINIC_OR_DEPARTMENT_OTHER): Payer: Self-pay | Admitting: *Deleted

## 2015-10-01 DIAGNOSIS — Z992 Dependence on renal dialysis: Secondary | ICD-10-CM | POA: Insufficient documentation

## 2015-10-01 DIAGNOSIS — Z79899 Other long term (current) drug therapy: Secondary | ICD-10-CM | POA: Insufficient documentation

## 2015-10-01 DIAGNOSIS — Z87718 Personal history of other specified (corrected) congenital malformations of genitourinary system: Secondary | ICD-10-CM | POA: Insufficient documentation

## 2015-10-01 DIAGNOSIS — R0789 Other chest pain: Secondary | ICD-10-CM | POA: Insufficient documentation

## 2015-10-01 DIAGNOSIS — F17210 Nicotine dependence, cigarettes, uncomplicated: Secondary | ICD-10-CM | POA: Insufficient documentation

## 2015-10-01 DIAGNOSIS — I509 Heart failure, unspecified: Secondary | ICD-10-CM | POA: Insufficient documentation

## 2015-10-01 DIAGNOSIS — Z862 Personal history of diseases of the blood and blood-forming organs and certain disorders involving the immune mechanism: Secondary | ICD-10-CM | POA: Insufficient documentation

## 2015-10-01 DIAGNOSIS — I120 Hypertensive chronic kidney disease with stage 5 chronic kidney disease or end stage renal disease: Secondary | ICD-10-CM | POA: Insufficient documentation

## 2015-10-01 DIAGNOSIS — F329 Major depressive disorder, single episode, unspecified: Secondary | ICD-10-CM | POA: Insufficient documentation

## 2015-10-01 DIAGNOSIS — E875 Hyperkalemia: Secondary | ICD-10-CM | POA: Diagnosis not present

## 2015-10-01 DIAGNOSIS — N186 End stage renal disease: Secondary | ICD-10-CM | POA: Insufficient documentation

## 2015-10-01 DIAGNOSIS — Z95810 Presence of automatic (implantable) cardiac defibrillator: Secondary | ICD-10-CM | POA: Insufficient documentation

## 2015-10-01 DIAGNOSIS — G8929 Other chronic pain: Secondary | ICD-10-CM | POA: Insufficient documentation

## 2015-10-01 DIAGNOSIS — Z9119 Patient's noncompliance with other medical treatment and regimen: Secondary | ICD-10-CM | POA: Insufficient documentation

## 2015-10-01 DIAGNOSIS — Z872 Personal history of diseases of the skin and subcutaneous tissue: Secondary | ICD-10-CM | POA: Insufficient documentation

## 2015-10-01 LAB — CBC WITH DIFFERENTIAL/PLATELET
BASOS ABS: 0 10*3/uL (ref 0.0–0.1)
BASOS PCT: 0 %
EOS PCT: 0 %
Eosinophils Absolute: 0 10*3/uL (ref 0.0–0.7)
HCT: 35.9 % — ABNORMAL LOW (ref 36.0–46.0)
Hemoglobin: 11.1 g/dL — ABNORMAL LOW (ref 12.0–15.0)
LYMPHS PCT: 25 %
Lymphs Abs: 1.7 10*3/uL (ref 0.7–4.0)
MCH: 28.4 pg (ref 26.0–34.0)
MCHC: 30.9 g/dL (ref 30.0–36.0)
MCV: 91.8 fL (ref 78.0–100.0)
MONO ABS: 0.6 10*3/uL (ref 0.1–1.0)
Monocytes Relative: 9 %
Neutro Abs: 4.5 10*3/uL (ref 1.7–7.7)
Neutrophils Relative %: 66 %
PLATELETS: 180 10*3/uL (ref 150–400)
RBC: 3.91 MIL/uL (ref 3.87–5.11)
RDW: 20.2 % — AB (ref 11.5–15.5)
WBC: 6.8 10*3/uL (ref 4.0–10.5)

## 2015-10-01 LAB — BASIC METABOLIC PANEL
ANION GAP: 15 (ref 5–15)
BUN: 68 mg/dL — ABNORMAL HIGH (ref 6–20)
CALCIUM: 9.6 mg/dL (ref 8.9–10.3)
CO2: 25 mmol/L (ref 22–32)
Chloride: 98 mmol/L — ABNORMAL LOW (ref 101–111)
Creatinine, Ser: 8.75 mg/dL — ABNORMAL HIGH (ref 0.44–1.00)
GFR calc Af Amer: 7 mL/min — ABNORMAL LOW (ref >60)
GFR, EST NON AFRICAN AMERICAN: 6 mL/min — AB (ref >60)
GLUCOSE: 92 mg/dL (ref 65–99)
Potassium: 5.7 mmol/L — ABNORMAL HIGH (ref 3.5–5.1)
Sodium: 138 mmol/L (ref 135–145)

## 2015-10-01 MED ORDER — DIPHENHYDRAMINE HCL 25 MG PO CAPS
25.0000 mg | ORAL_CAPSULE | Freq: Four times a day (QID) | ORAL | Status: DC | PRN
  Administered 2015-10-01: 25 mg via ORAL
  Filled 2015-10-01: qty 1

## 2015-10-01 MED ORDER — HYDROMORPHONE HCL 1 MG/ML IJ SOLN: 1.0000 mg | Freq: Once | INTRAMUSCULAR | Status: DC

## 2015-10-01 MED ORDER — HYDROMORPHONE HCL 1 MG/ML IJ SOLN
1.0000 mg | Freq: Once | INTRAMUSCULAR | Status: AC
  Administered 2015-10-01: 1 mg via INTRAMUSCULAR
  Filled 2015-10-01: qty 1

## 2015-10-01 NOTE — ED Notes (Signed)
I completed ECG then brought patient to room for vitals, Nurse stated she will complete vitals after triage.

## 2015-10-01 NOTE — Discharge Instructions (Signed)

## 2015-10-01 NOTE — ED Notes (Signed)
On discharge pt states she mother was in lobby to drive her home. Mother not found in lobby , pt called mother and mother asked where pt was she said hospital and mother said she wasn't  coming to get her and to call another ride. Pt escorted to lobby informed [employer] that pt is not to drive d/t getting narcotics

## 2015-10-01 NOTE — ED Notes (Addendum)
Pt reports chest pain x 2days, seen at [facility] ED last night for same, pt states was dialysis yesterday in ED

## 2015-10-01 NOTE — ED Provider Notes (Signed)
CSN: [id number]     Arrival date & time 10/01/15  2024 History  By signing my name below, I, Doreatha Martin, attest that this documentation has been prepared under the direction and in the presence of Lyndal Pulley, MD. Electronically Signed: Doreatha Martin, ED Scribe. 10/01/2015. 9:11 PM.    Chief Complaint  Patient presents with  . Chest Pain   Patient is a 24 y.o. female presenting with chest pain. The history is provided by the patient. No language interpreter was used.  Chest Pain Pain location:  R chest Pain quality: aching   Pain radiates to:  Does not radiate Pain radiates to the back: no   Pain severity:  Moderate Onset quality:  Gradual Duration:  1 day Progression:  Unchanged Chronicity:  Recurrent Context: at rest   Relieved by:  None tried Worsened by:  Deep breathing Ineffective treatments:  None tried Associated symptoms: no lower extremity edema   Risk factors: hypertension     HPI Comments: [name] Small is a 24 y.o. female with h/o HTN, pulmonary emboli, CHF, cardiomyopathy, ESRD currently on hemodialysis (last dialyzed 2 days ago), PKD, sickle cell anemia, GERD, defibrillator (done at [facility]) who presents to the Emergency Department complaining of moderate, recurrent, right-sided CP onset tonight. Pt states h/o similar CP secondary to ESRD and dialysis. She states pain is worsened with deep breathing. She reports that she has not missed any dialysis appointments and is due for dialysis tomorrow. Pt is seen frequently in the ED with similar complaints. She is not followed by a clinic or nephrology. Her dialysis 2 days ago lasted 3 hours and 3.3L of fluid was drawn off. Pt is no longer taking Coumadin for PE. She states she has no kidney function and does not make urine. She denies leg swelling, additional symptoms.   Past Medical History  Diagnosis Date  . Hemodialysis patient (HCC)   . Hypertension   . Pulmonary emboli (HCC) 01/2012    Bilateral, moderate clot burden, areas  of pulmonary infarction and central necrosis  . CHF (congestive heart failure) (HCC)   . Cardiomyopathy   . Dysrhythmia     at times per pt.  . Anemia   . H/O transfusion of packed red blood cells   . 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 Polycystic kidney disease   . Cellulitis and abscess of face 03/22/2013  . Renal insufficiency   . Sickle cell anemia (HCC)   . Chronic pain   . GERD (gastroesophageal reflux disease)   . Depression    Past Surgical History  Procedure Laterality Date  . Nephrectomy    . Av fistula placement    . Kidney transplant  2008    failed  . Tonsillectomy      as a child.  . Adenoidectomy    . Incision and drainage abscess Right 03/21/2013    Procedure: INCISION AND DRAINAGE RIGHT CHEEK ABSCESS REMOVAL OF FOREIGN BODY;  Surgeon: Serena Colonel, MD;  Location: [facility] OR;  Service: ENT;  Laterality: Right;  . Implantable cardioverter defibrillator implant Right 12/2014   Family History  Problem Relation Age of Onset  . Polycystic kidney disease Father    Social History  Substance Use Topics  . Smoking status: Current Every Day Smoker -- 0.20 packs/day    Types: Cigarettes  . Smokeless tobacco: Never Used  . Alcohol Use: No   OB History  No data available     Review of Systems  Cardiovascular: Positive for chest pain. Negative for leg swelling.  All other systems reviewed and are negative.  Allergies  Tramadol; Iohexol; Morphine and related; and Vicodin  Home Medications   Prior to Admission medications   Medication Sig Start Date End Date Taking? Authorizing Provider  amLODipine (NORVASC) 10 MG tablet Take 1 tablet (10 mg total) by mouth at bedtime. 09/25/15   Zannie Cove, MD  carvedilol (COREG) 6.25 MG tablet Take 1 tablet (6.25 mg total) by mouth 2 (two) times daily. 09/25/15   Zannie Cove, MD  cinacalcet (SENSIPAR) 30 MG tablet Take  1 tablet (30 mg total) by mouth daily with supper. 09/25/15   Zannie Cove, MD  isosorbide dinitrate (ISORDIL) 10 MG tablet Take 1 tablet (10 mg total) by mouth 3 (three) times daily. 09/25/15   Zannie Cove, MD  LORazepam (ATIVAN) 1 MG tablet Take 1 mg by mouth daily. Patient states she takes one tablet by mouth every day per patient    Historical Provider, MD  Oxycodone HCl 10 MG TABS Take 1 tablet (10 mg total) by mouth every 8 (eight) hours as needed. For pain 09/25/15   Zannie Cove, MD  senna (SENOKOT) 8.6 MG TABS tablet Take 1 tablet (8.6 mg total) by mouth daily. 09/25/15   Zannie Cove, MD  sevelamer carbonate (RENVELA) 800 MG tablet Take 3 tablets (2,400 mg total) by mouth 3 (three) times daily with meals. 09/25/15 09/24/16  Zannie Cove, MD   BP 136/100 mmHg  Pulse 72  Resp 16  Wt 116 lb 13.5 oz (53 kg)  SpO2 98% Physical Exam  Constitutional: She is oriented to person, place, and time. She appears well-developed and well-nourished. No distress.  HENT:  Head: Normocephalic.  Eyes: Conjunctivae are normal.  Neck: Neck supple. No tracheal deviation present.  Cardiovascular: Normal rate, regular rhythm and normal heart sounds.   Pulmonary/Chest: Effort normal and breath sounds normal. No respiratory distress. She has no rales. She exhibits no tenderness.  Abdominal: Soft. She exhibits no distension.  Neurological: She is alert and oriented to person, place, and time.  Skin: Skin is warm and dry.  Psychiatric: She has a normal mood and affect.   ED Course  Procedures (including critical care time) DIAGNOSTIC STUDIES: Oxygen Saturation is 98% on RA, normal by my interpretation.    COORDINATION OF CARE: 9:10 PM Discussed treatment plan with pt at bedside and pt agreed to plan. Plan to XR chest, check blood work.     Labs Review Labs Reviewed  CBC WITH DIFFERENTIAL/PLATELET - Abnormal; Notable for the following:    Hemoglobin 11.1 (*)    HCT 35.9 (*)    RDW 20.2 (*)     All other components within normal limits  BASIC METABOLIC PANEL - Abnormal; Notable for the following:    Potassium 5.7 (*)    Chloride 98 (*)    BUN 68 (*)    Creatinine, Ser 8.75 (*)    GFR calc non Af Amer 6 (*)    GFR calc Af Amer 7 (*)    All other components within normal limits    Imaging Review No results found. I have personally reviewed and evaluated these images and lab results as part of my medical decision-making.   EKG Interpretation   Date/Time:  Friday October 01 2015 20:31:30 EST Ventricular Rate:  106 PR Interval:  328 QRS Duration: 86 QT Interval:  428 QTC Calculation: 568 R  Axis:   46 Text Interpretation:  Sinus tachycardia with 1st degree A-V block Abnormal  ECG Since previous tracing Nonspecific T wave abnormality, improved in  Inferolateral leads Confirmed by Kalif Kattner MD, Pamelia Botto [phone]) on 10/01/2015  8:35:19 PM      MDM   Final diagnoses:  Other chest pain    24 year old female who is chronically ill with end-stage renal disease, CHF, previous history of remote pulmonary emboli present ongoing right-sided chest pain that she has had recurrently. She was seen 2 days ago for the same and needed dialysis. She has been fired from multiple dialysis centers due to noncompliance. She left AGAINST MEDICAL ADVICE early from her last dialysis session. Screening laboratory work and chest x-ray were performed to evaluate for emergent need for dialysis. She was given a dose of pain medication here to help her comply with workup. Patient is not persistently tachycardic during her emergency department course and has no other clinical signs or symptoms of pulmonary embolus despite her high risk features no imaging is indicated. Her last pulmonary embolus scan was 7 months ago and it was negative when she had similar pain.  At this time I suspect that her pain is more consistent with chronic chest wall pain. She does not appear fluid overloaded and has no indication for  emergent dialysis currently. Her potassium is slightly elevated without any EKG changes and she has dialysis due tomorrow which she states she will go to. I stressed that this is very important for her to do and she stated for a full treatment this time. Patient wishing to be discharged.  I personally performed the services described in this documentation, which was scribed in my presence. The recorded information has been reviewed and is accurate.     Lyndal Pulley, MD 10/01/15 2227

## 2015-10-01 NOTE — ED Notes (Signed)
Patient transported to X-ray 

## 2015-10-01 NOTE — ED Notes (Signed)
MD at bedside. 

## 2015-10-03 ENCOUNTER — Inpatient Hospital Stay (HOSPITAL_COMMUNITY)
Admission: EM | Admit: 2015-10-03 | Discharge: 2015-10-05 | DRG: 640 | Disposition: A | Discharge status: Home / Self Care | Payer: Medicare Other | Attending: Internal Medicine | Admitting: Internal Medicine

## 2015-10-03 ENCOUNTER — Emergency Department (HOSPITAL_COMMUNITY): Payer: Medicare Other

## 2015-10-03 ENCOUNTER — Encounter (HOSPITAL_COMMUNITY): Payer: Self-pay | Admitting: Emergency Medicine

## 2015-10-03 DIAGNOSIS — Z940 Kidney transplant status: Secondary | ICD-10-CM | POA: Diagnosis not present

## 2015-10-03 DIAGNOSIS — G8929 Other chronic pain: Secondary | ICD-10-CM | POA: Diagnosis present

## 2015-10-03 DIAGNOSIS — Z79899 Other long term (current) drug therapy: Secondary | ICD-10-CM | POA: Diagnosis not present

## 2015-10-03 DIAGNOSIS — N186 End stage renal disease: Secondary | ICD-10-CM | POA: Diagnosis present

## 2015-10-03 DIAGNOSIS — N19 Unspecified kidney failure: Secondary | ICD-10-CM

## 2015-10-03 DIAGNOSIS — R748 Abnormal levels of other serum enzymes: Secondary | ICD-10-CM | POA: Diagnosis present

## 2015-10-03 DIAGNOSIS — I132 Hypertensive heart and chronic kidney disease with heart failure and with stage 5 chronic kidney disease, or end stage renal disease: Secondary | ICD-10-CM | POA: Diagnosis present

## 2015-10-03 DIAGNOSIS — K219 Gastro-esophageal reflux disease without esophagitis: Secondary | ICD-10-CM | POA: Diagnosis present

## 2015-10-03 DIAGNOSIS — N189 Chronic kidney disease, unspecified: Secondary | ICD-10-CM | POA: Diagnosis present

## 2015-10-03 DIAGNOSIS — Z888 Allergy status to other drugs, medicaments and biological substances status: Secondary | ICD-10-CM

## 2015-10-03 DIAGNOSIS — R079 Chest pain, unspecified: Secondary | ICD-10-CM | POA: Diagnosis present

## 2015-10-03 DIAGNOSIS — N185 Chronic kidney disease, stage 5: Secondary | ICD-10-CM | POA: Diagnosis present

## 2015-10-03 DIAGNOSIS — D631 Anemia in chronic kidney disease: Secondary | ICD-10-CM | POA: Diagnosis present

## 2015-10-03 DIAGNOSIS — Z9115 Patient's noncompliance with renal dialysis: Secondary | ICD-10-CM | POA: Diagnosis not present

## 2015-10-03 DIAGNOSIS — I5022 Chronic systolic (congestive) heart failure: Secondary | ICD-10-CM | POA: Diagnosis present

## 2015-10-03 DIAGNOSIS — I5023 Acute on chronic systolic (congestive) heart failure: Secondary | ICD-10-CM | POA: Diagnosis present

## 2015-10-03 DIAGNOSIS — R7989 Other specified abnormal findings of blood chemistry: Secondary | ICD-10-CM

## 2015-10-03 DIAGNOSIS — Z885 Allergy status to narcotic agent status: Secondary | ICD-10-CM

## 2015-10-03 DIAGNOSIS — F17210 Nicotine dependence, cigarettes, uncomplicated: Secondary | ICD-10-CM | POA: Diagnosis present

## 2015-10-03 DIAGNOSIS — R0789 Other chest pain: Secondary | ICD-10-CM | POA: Diagnosis not present

## 2015-10-03 DIAGNOSIS — Z992 Dependence on renal dialysis: Secondary | ICD-10-CM

## 2015-10-03 DIAGNOSIS — Z86711 Personal history of pulmonary embolism: Secondary | ICD-10-CM | POA: Diagnosis not present

## 2015-10-03 DIAGNOSIS — D571 Sickle-cell disease without crisis: Secondary | ICD-10-CM | POA: Diagnosis present

## 2015-10-03 DIAGNOSIS — Z905 Acquired absence of kidney: Secondary | ICD-10-CM | POA: Diagnosis not present

## 2015-10-03 DIAGNOSIS — I10 Essential (primary) hypertension: Secondary | ICD-10-CM

## 2015-10-03 DIAGNOSIS — D696 Thrombocytopenia, unspecified: Secondary | ICD-10-CM | POA: Diagnosis present

## 2015-10-03 DIAGNOSIS — R778 Other specified abnormalities of plasma proteins: Secondary | ICD-10-CM | POA: Diagnosis present

## 2015-10-03 DIAGNOSIS — E875 Hyperkalemia: Secondary | ICD-10-CM | POA: Diagnosis present

## 2015-10-03 LAB — CBC
HCT: 33.7 % — ABNORMAL LOW (ref 36.0–46.0)
HEMOGLOBIN: 10.7 g/dL — AB (ref 12.0–15.0)
MCH: 29.2 pg (ref 26.0–34.0)
MCHC: 31.8 g/dL (ref 30.0–36.0)
MCV: 91.8 fL (ref 78.0–100.0)
Platelets: 207 10*3/uL (ref 150–400)
RBC: 3.67 MIL/uL — AB (ref 3.87–5.11)
RDW: 20.1 % — ABNORMAL HIGH (ref 11.5–15.5)
WBC: 6 10*3/uL (ref 4.0–10.5)

## 2015-10-03 LAB — BASIC METABOLIC PANEL
ANION GAP: 18 — AB (ref 5–15)
BUN: 99 mg/dL — ABNORMAL HIGH (ref 6–20)
CHLORIDE: 100 mmol/L — AB (ref 101–111)
CO2: 21 mmol/L — AB (ref 22–32)
Calcium: 10.1 mg/dL (ref 8.9–10.3)
Creatinine, Ser: 10.97 mg/dL — ABNORMAL HIGH (ref 0.44–1.00)
GFR calc non Af Amer: 4 mL/min — ABNORMAL LOW (ref >60)
GFR, EST AFRICAN AMERICAN: 5 mL/min — AB (ref >60)
Glucose, Bld: 87 mg/dL (ref 65–99)
Potassium: 6.9 mmol/L (ref 3.5–5.1)
Sodium: 139 mmol/L (ref 135–145)

## 2015-10-03 LAB — I-STAT TROPONIN, ED: TROPONIN I, POC: 0.1 ng/mL — AB (ref 0.00–0.08)

## 2015-10-03 LAB — TROPONIN I: Troponin I: 0.11 ng/mL — ABNORMAL HIGH (ref <0.031)

## 2015-10-03 MED ORDER — HYDROMORPHONE HCL 1 MG/ML IJ SOLN
1.0000 mg | Freq: Once | INTRAMUSCULAR | Status: AC
  Administered 2015-10-03: 1 mg via INTRAMUSCULAR
  Filled 2015-10-03: qty 1

## 2015-10-03 MED ORDER — INSULIN ASPART 100 UNIT/ML ~~LOC~~ SOLN
5.0000 [IU] | Freq: Once | SUBCUTANEOUS | Status: AC
  Administered 2015-10-03: 5 [IU] via INTRAVENOUS

## 2015-10-03 MED ORDER — SODIUM POLYSTYRENE SULFONATE 15 GM/60ML PO SUSP
45.0000 g | Freq: Once | ORAL | Status: AC
  Administered 2015-10-03: 45 g via ORAL
  Filled 2015-10-03: qty 180

## 2015-10-03 MED ORDER — INSULIN ASPART 100 UNIT/ML ~~LOC~~ SOLN
5.0000 [IU] | Freq: Once | SUBCUTANEOUS | Status: DC
  Filled 2015-10-03: qty 1

## 2015-10-03 MED ORDER — HYDROMORPHONE HCL 1 MG/ML IJ SOLN
1.0000 mg | Freq: Once | INTRAMUSCULAR | Status: AC
Start: 2015-10-03 — End: 2015-10-03
  Administered 2015-10-03: 1 mg via INTRAVENOUS
  Filled 2015-10-03: qty 1

## 2015-10-03 MED ORDER — IOHEXOL 350 MG/ML SOLN
100.0000 mL | Freq: Once | INTRAVENOUS | Status: AC | PRN
  Administered 2015-10-03: 80 mL via INTRAVENOUS

## 2015-10-03 MED ORDER — DEXTROSE 50 % IV SOLN
1.0000 | Freq: Once | INTRAVENOUS | Status: AC
Start: 2015-10-03 — End: 2015-10-03
  Administered 2015-10-03: 50 mL via INTRAVENOUS
  Filled 2015-10-03: qty 50

## 2015-10-03 MED ORDER — DIPHENHYDRAMINE HCL 50 MG/ML IJ SOLN
50.0000 mg | Freq: Once | INTRAMUSCULAR | Status: AC
  Administered 2015-10-03: 50 mg via INTRAVENOUS
  Filled 2015-10-03: qty 1

## 2015-10-03 MED ORDER — SODIUM CHLORIDE 0.9 % IV SOLN
1.0000 g | Freq: Once | INTRAVENOUS | Status: AC
  Administered 2015-10-03: 1 g via INTRAVENOUS
  Filled 2015-10-03: qty 10

## 2015-10-03 MED ORDER — NITROGLYCERIN 2 % TD OINT
1.0000 [in_us] | TOPICAL_OINTMENT | Freq: Four times a day (QID) | TRANSDERMAL | Status: DC
  Administered 2015-10-03 – 2015-10-05 (×3): 1 [in_us] via TOPICAL
  Filled 2015-10-03 (×2): qty 30

## 2015-10-03 MED ORDER — HYDROCORTISONE NA SUCCINATE PF 100 MG IJ SOLR
200.0000 mg | Freq: Once | INTRAMUSCULAR | Status: AC
  Administered 2015-10-03: 200 mg via INTRAVENOUS
  Filled 2015-10-03: qty 4

## 2015-10-03 NOTE — ED Notes (Signed)
This NT tried to access blood twice without success

## 2015-10-03 NOTE — ED Notes (Signed)
[facility] called--- report on pt provided. 

## 2015-10-03 NOTE — ED Notes (Signed)
[facility] here to transfer pt to [facility].

## 2015-10-03 NOTE — ED Notes (Signed)
Pt c/o sharp/tight central chest pain onset yesterday, states she had dialysis on Wednesday. Denies SOB.

## 2015-10-03 NOTE — ED Provider Notes (Addendum)
CSN: [id number]     Arrival date & time 10/03/15  1605 History   First MD Initiated Contact with Patient 10/03/15 1648     Chief Complaint  Patient presents with  . Chest Pain     (Consider location/radiation/quality/duration/timing/severity/associated sxs/prior Treatment) HPI Comments: Wednesday was last dialysis CP 3 days ago, right sided tight/sharp pain, worse with laying flat and deep breaths No shortness of breath Cough-2 days, coughing up yellow No fevers/chills Vomiting-1 day Diaphoresis CP continuation of pain on Friday, prior pain was on left side, this is new type of pain Hx PE, was years ago, not sure if this is similar No blood thinners +smoking, no fam hx CAD CHF/ESRD   Patient is a 24 y.o. female presenting with chest pain.  Chest Pain Associated symptoms: vomiting (x1day)   Associated symptoms: no abdominal pain, no back pain, no cough, no fever, no headache, no nausea and no shortness of breath     Past Medical History  Diagnosis Date  . Hemodialysis patient (HCC)   . Hypertension   . Pulmonary emboli (HCC) 01/2012    Bilateral, moderate clot burden, areas of pulmonary infarction and central necrosis  . CHF (congestive heart failure) (HCC)   . Cardiomyopathy   . Dysrhythmia     at times per pt.  . Anemia   . H/O transfusion of packed red blood cells   . 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 Polycystic kidney disease   . Cellulitis and abscess of face 03/22/2013  . Renal insufficiency   . Sickle cell anemia (HCC)   . Chronic pain   . GERD (gastroesophageal reflux disease)   . Depression    Past Surgical History  Procedure Laterality Date  . Nephrectomy    . Av fistula placement    . Kidney transplant  2008    failed  . Tonsillectomy      as a child.  . Adenoidectomy    . Incision and drainage abscess Right 03/21/2013    Procedure:  INCISION AND DRAINAGE RIGHT CHEEK ABSCESS REMOVAL OF FOREIGN BODY;  Surgeon: Serena Colonel, MD;  Location: [facility] OR;  Service: ENT;  Laterality: Right;  . Implantable cardioverter defibrillator implant Right 12/2014   Family History  Problem Relation Age of Onset  . Polycystic kidney disease Father    Social History  Substance Use Topics  . Smoking status: Current Every Day Smoker -- 0.20 packs/day    Types: Cigarettes  . Smokeless tobacco: Never Used  . Alcohol Use: No   OB History    No data available     Review of Systems  Constitutional: Negative for fever.  HENT: Negative for sore throat.   Eyes: Negative for visual disturbance.  Respiratory: Negative for cough and shortness of breath.   Cardiovascular: Positive for chest pain.  Gastrointestinal: Positive for vomiting (x1day). Negative for nausea and abdominal pain.  Genitourinary: Negative for difficulty urinating.  Musculoskeletal: Negative for back pain and neck pain.  Skin: Negative for rash.  Neurological: Negative for syncope and headaches.      Allergies  Tramadol; Iohexol; Morphine and related; and Vicodin  Home Medications   Prior to Admission medications   Medication Sig Start Date End Date Taking? Authorizing Provider  amLODipine (NORVASC) 10 MG tablet Take 1 tablet (10 mg total) by mouth at bedtime. 09/25/15  Yes Zannie Cove, MD  carvedilol (COREG) 6.25 MG  tablet Take 1 tablet (6.25 mg total) by mouth 2 (two) times daily. 09/25/15  Yes Zannie Cove, MD  cinacalcet (SENSIPAR) 30 MG tablet Take 1 tablet (30 mg total) by mouth daily with supper. 09/25/15  Yes Zannie Cove, MD  isosorbide dinitrate (ISORDIL) 10 MG tablet Take 1 tablet (10 mg total) by mouth 3 (three) times daily. 09/25/15  Yes Zannie Cove, MD  LORazepam (ATIVAN) 1 MG tablet Take 1 mg by mouth daily. Patient states she takes one tablet by mouth every day per patient   Yes Historical Provider, MD  Oxycodone HCl 10 MG TABS Take 1 tablet (10 mg  total) by mouth every 8 (eight) hours as needed. For pain 09/25/15  Yes Zannie Cove, MD  sevelamer carbonate (RENVELA) 800 MG tablet Take 3 tablets (2,400 mg total) by mouth 3 (three) times daily with meals. 09/25/15 09/24/16 Yes Zannie Cove, MD  senna (SENOKOT) 8.6 MG TABS tablet Take 1 tablet (8.6 mg total) by mouth daily. Patient not taking: Reported on 10/03/2015 09/25/15   Zannie Cove, MD   BP 136/110 mmHg  Pulse 104  Temp(Src) 98.2 F (36.8 C) (Axillary)  Resp 24  Ht  (1.6 m)  Wt 131 lb 9.8 oz (59.7 kg)  BMI 23.32 kg/m2  SpO2 99% Physical Exam  Constitutional: She is oriented to person, place, and time. She appears well-developed and well-nourished. No distress.  HENT:  Head: Normocephalic and atraumatic.  Eyes: Conjunctivae and EOM are normal.  Neck: Normal range of motion.  Cardiovascular: Normal rate, regular rhythm, normal heart sounds and intact distal pulses.  Exam reveals no gallop and no friction rub.   No murmur heard. Pulmonary/Chest: Effort normal and breath sounds normal. No respiratory distress. She has no wheezes. She has no rales. She exhibits no tenderness.  Abdominal: Soft. She exhibits no distension. There is no tenderness. There is no guarding.  Musculoskeletal: She exhibits no edema or tenderness.  Neurological: She is alert and oriented to person, place, and time.  Skin: Skin is warm and dry. No rash noted. She is not diaphoretic. No erythema.  Nursing note and vitals reviewed.   ED Course  Procedures (including critical care time) Labs Review Labs Reviewed  BASIC METABOLIC PANEL - Abnormal; Notable for the following:    Potassium 6.9 (*)    Chloride 100 (*)    CO2 21 (*)    BUN 99 (*)    Creatinine, Ser 10.97 (*)    GFR calc non Af Amer 4 (*)    GFR calc Af Amer 5 (*)    Anion gap 18 (*)    All other components within normal limits  CBC - Abnormal; Notable for the following:    RBC 3.67 (*)    Hemoglobin 10.7 (*)    HCT 33.7 (*)     RDW 20.1 (*)    All other components within normal limits  TROPONIN I - Abnormal; Notable for the following:    Troponin I 0.11 (*)    All other components within normal limits  I-STAT TROPOININ, ED - Abnormal; Notable for the following:    Troponin i, poc 0.10 (*)    All other components within normal limits  BASIC METABOLIC PANEL  CBC    Imaging Review Dg Chest 2 View  10/03/2015  CLINICAL DATA:  Central chest pain for 2 days EXAM: CHEST - 2 VIEW COMPARISON:  10/01/2015 FINDINGS: Cardiomegaly is again noted. Defibrillator is again seen and stable. The lungs are clear bilaterally.  No acute bony abnormality is noted. IMPRESSION: No change from the recent exam. Electronically Signed   By: Alcide Clever M.D.   On: 10/03/2015 17:14   Ct Angio Chest Pe W/cm &/or Wo Cm  10/03/2015  CLINICAL DATA:  Acute chest pain. EXAM: CT ANGIOGRAPHY CHEST WITH CONTRAST TECHNIQUE: Multidetector CT imaging of the chest was performed using the standard protocol during bolus administration of intravenous contrast. Multiplanar CT image reconstructions and MIPs were obtained to evaluate the vascular anatomy. CONTRAST:  88mL OMNIPAQUE IOHEXOL 350 MG/ML SOLN COMPARISON:  Chest radiograph of same day. FINDINGS: Mild cardiomegaly is noted. No pneumothorax or pleural effusion is noted. Minimal left posterior basilar subsegmental atelectasis is noted. Right lung is clear. There is no evidence of thoracic aortic dissection or aneurysm. Right-sided pacemaker is noted. No mediastinal mass or adenopathy is noted. Hepatomegaly is noted in the visualized portion of the upper abdomen. Mild pericardial effusion is noted. Some degree of ascites is noted. Review of the MIP images confirms the above findings. IMPRESSION: Mild cardiomegaly with mild pericardial effusion. No evidence of pulmonary embolus. Minimal left posterior basilar subsegmental atelectasis is noted. Hepatomegaly is noted with probable ascites. Electronically Signed   By:  Lupita Raider, M.D.   On: 10/03/2015 21:42   I have personally reviewed and evaluated these images and lab results as part of my medical decision-making.   EKG Interpretation   Date/Time:  Sunday October 03 2015 16:19:31 EST Ventricular Rate:  98 PR Interval:  258 QRS Duration: 98 QT Interval:  420 QTC Calculation: 536 R Axis:   15 Text Interpretation:  Sinus rhythm Prolonged PR interval Nonspecific T  abnormalities, lateral leads Prolonged QT interval ECG unchanged from  prior Confirmed by [facility] MD, Chrishaun Sasso ([id number]) on 10/03/2015 9:34:48 PM      Emergency Ultrasound:  US Guidance for needle guidance  Performed by Dr. Dalene Seltzer Indication: US guided IV for peripheral access and blood draw  Linear probe used in real-time to visualize location of needle entry through skin. Interpretation: successful placement of US guided IV Image archived electronically.   MDM   Final diagnoses:  Hyperkalemia  Chest pain, unspecified chest pain type  ESRD (end stage renal disease) (HCC)   24 year old female with a history of ESRD, CHF, hypertension, pulmonary embolus presents with concern for right sided chest pain. Differential diagnosis includes PE, ACS, pneumonia, volume overload. Patient does not receive dialysis since Wednesday, and reports she has no dialysis center in the area. EKG was done and advised by me and showed similar ST abnormalities prior, similar prolonged PR interval, and no definite hyperkalemic EKG changes. Chest x-ray showed no significant change from prior, patient has normal oxygenation on room air.  Given history of pleuritic chest pain with prior PE, CT angio ordered which showed no evidence of PE.  CMP shows hyperkalemia with a potassium of 6.9 however no acute EKG changes. Patient was given calcium, insulin and dextrose, and Kayexalate. Discussed with nephrology who reports they will be unable to complete dialysis tonight, however will continue to medically manage  the patient. We'll transfer to [facility] for further care on StepDown unit.     Alvira Monday, MD 10/04/15 1884  Alvira Monday, MD 10/04/15 1301

## 2015-10-03 NOTE — ED Notes (Signed)
[facility] was notified of pt's transfer to  Hospital. 

## 2015-10-03 NOTE — H&P (Signed)
[facility] Admission History and Physical       Ivah Chamorro YQM:[id number] DOB: 11-Feb-1991 DOA: 10/03/2015  Referring physician: EDP PCP: No PCP Per Patient  Specialists: Dr Arlean Hopping  (Nephrology)  Chief Complaint: Chest Pain  HPI: [name] Small is a 24 y.o. female with a history of Polycystic Kidney Disease S/P failed Cadaveric Renal Transplant in 2008 ( at [facility]) on dialysis for the past 6 years who presents to the ED with complaints of Sharp right sided chest pain x 3 days.   She was seen in the ED 2 days ago for chest pain.   She is in transition after moving from [place] back to [place], and had her last dialysis Rx on Wednesday but has not been established with an outpatient dialysis Center as of yet in this area nor a PCP.   She did not have dialysis on Friday.   IN the ED tonight she was evaluated and a CTA of the Chest was performed and was negative for Pulmonary Emboli.  The initial Troponin level was 0.11. And her other labs revealed Hyperkalemia at 6.9,  With a BUN/Cr of 99/10/97.   She was administered therapy for Hyperkalemia, and Dr. Arlean Hopping of [facility] was contacted and consulted and arrangements were made for transfer to [facility].        Review of Systems:  Constitutional: No Weight Loss, No Weight Gain, Night Sweats, Fevers, Chills, Dizziness, Light Headedness, Fatigue, or Generalized Weakness HEENT: No Headaches, Difficulty Swallowing,Tooth/Dental Problems,Sore Throat,  No Sneezing, Rhinitis, Ear Ache, Nasal Congestion, or Post Nasal Drip,  Cardio-vascular:  +Chest pain, Orthopnea, PND, +Edema in Lower Extremities, Anasarca, Dizziness, Palpitations  Resp: No Dyspnea, No DOE, No Productive Cough, No Non-Productive Cough, No Hemoptysis, No Wheezing.    GI: No Heartburn, Indigestion, Abdominal Pain, Nausea, Vomiting, Diarrhea, Constipation, Hematemesis, Hematochezia, Melena, Change in Bowel Habits,  Loss of Appetite  GU: No Dysuria, No  Change in Color of Urine, No Urgency or Urinary Frequency, No Flank pain.  Musculoskeletal: No Joint Pain or Swelling, No Decreased Range of Motion, No Back Pain.  Neurologic: No Syncope, No Seizures, Muscle Weakness, Paresthesia, Vision Disturbance or Loss, No Diplopia, No Vertigo, No Difficulty Walking,  Skin: No Rash or Lesions. Psych: No Change in Mood or Affect, No Depression or Anxiety, No Memory loss, No Confusion, or Hallucinations   Past Medical History  Diagnosis Date  . Hemodialysis patient (HCC)   . Hypertension   . Pulmonary emboli (HCC) 01/2012    Bilateral, moderate clot burden, areas of pulmonary infarction and central necrosis  . CHF (congestive heart failure) (HCC)   . Cardiomyopathy   . Dysrhythmia     at times per pt.  . Anemia   . H/O transfusion of packed red blood cells   . 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 Polycystic kidney disease   . Cellulitis and abscess of face 03/22/2013  . Renal insufficiency   . Sickle cell anemia (HCC)   . Chronic pain   . GERD (gastroesophageal reflux disease)   . Depression      Past Surgical History  Procedure Laterality Date  . Nephrectomy    . Av fistula placement    . Kidney transplant  2008    failed  . Tonsillectomy      as a child.  . Adenoidectomy    . Incision and drainage abscess  Right 03/21/2013    Procedure: INCISION AND DRAINAGE RIGHT CHEEK ABSCESS REMOVAL OF FOREIGN BODY;  Surgeon: Serena Colonel, MD;  Location: [facility] OR;  Service: ENT;  Laterality: Right;  . Implantable cardioverter defibrillator implant Right 12/2014      Prior to Admission medications   Medication Sig Start Date End Date Taking? Authorizing Provider  amLODipine (NORVASC) 10 MG tablet Take 1 tablet (10 mg total) by mouth at bedtime. 09/25/15  Yes Zannie Cove, MD  carvedilol (COREG) 6.25 MG tablet Take 1 tablet (6.25 mg total) by  mouth 2 (two) times daily. 09/25/15  Yes Zannie Cove, MD  cinacalcet (SENSIPAR) 30 MG tablet Take 1 tablet (30 mg total) by mouth daily with supper. 09/25/15  Yes Zannie Cove, MD  isosorbide dinitrate (ISORDIL) 10 MG tablet Take 1 tablet (10 mg total) by mouth 3 (three) times daily. 09/25/15  Yes Zannie Cove, MD  LORazepam (ATIVAN) 1 MG tablet Take 1 mg by mouth daily. Patient states she takes one tablet by mouth every day per patient   Yes Historical Provider, MD  Oxycodone HCl 10 MG TABS Take 1 tablet (10 mg total) by mouth every 8 (eight) hours as needed. For pain 09/25/15  Yes Zannie Cove, MD  sevelamer carbonate (RENVELA) 800 MG tablet Take 3 tablets (2,400 mg total) by mouth 3 (three) times daily with meals. 09/25/15 09/24/16 Yes Zannie Cove, MD  senna (SENOKOT) 8.6 MG TABS tablet Take 1 tablet (8.6 mg total) by mouth daily. Patient not taking: Reported on 10/03/2015 09/25/15   Zannie Cove, MD     Allergies  Allergen Reactions  . Tramadol Anaphylaxis  . Iohexol Itching  . Morphine And Related Itching    Ok with oxycodone  . Vicodin [Hydrocodone-Acetaminophen] Hives    Social History:  reports that she has been smoking Cigarettes.  She has been smoking about 0.20 packs per day. She has never used smokeless tobacco. She reports that she does not drink alcohol or use illicit drugs.    Family History  Problem Relation Age of Onset  . Polycystic kidney disease Father        Physical Exam:  GEN:  Pleasant Well Nourished and Well Developed 24 y.o. African American female examined and in no acute distress; cooperative with exam Filed Vitals:   10/03/15 1612 10/03/15 1900 10/03/15 2006 10/03/15 2158  BP: 141/109 139/110 146/111 133/106  Pulse: 99 95 118 107  Temp: 97.9 F (36.6 C) 97.8 F (36.6 C)  98.8 F (37.1 C)  TempSrc: Oral Oral  Oral  Resp: SpO2: 100% 98% 97% 98%   Blood pressure 133/106, pulse 107, temperature 98.8 F (37.1 C), temperature  source Oral, resp. rate 20, SpO2 98 %. PSYCH: She is alert and oriented x4; does not appear anxious does not appear depressed; affect is normal HEENT: Normocephalic and Atraumatic +Facial Edema, Mucous membranes pink; PERRLA; EOM intact; Fundi:  Benign;  No scleral icterus, Nares: Patent, Oropharynx: Clear, Fair Dentition,    Neck:  FROM, No Cervical Lymphadenopathy nor Thyromegaly or Carotid Bruit; No JVD; Breasts:: Not examined CHEST WALL: No tenderness CHEST: Normal respiration, clear to auscultation bilaterally HEART: Regular rate and rhythm; no murmurs rubs or gallops BACK: No kyphosis or scoliosis; No CVA tenderness ABDOMEN: Positive Bowel Sounds, Soft Non-Tender, No Rebound or Guarding; No Masses, No Organomegaly Rectal Exam: Not done EXTREMITIES: No Cyanosis, Clubbing, 2+BLE Edema; No Ulcerations. Genitalia: not examined PULSES: 2+ and symmetric SKIN: Normal hydration no rash or ulceration  CNS:  Alert and Oriented x 4, No Focal Deficits Vascular: pulses palpable throughout    Labs on Admission:  Basic Metabolic Panel:  Recent Labs Lab 09/29/15 1242 10/01/15 2130 10/03/15 1854  NA 139 138 139  K 5.0 5.7* 6.9*  CL 100* 98* 100*  CO2 22 25 21*  GLUCOSE 82 92 87  BUN 62* 68* 99*  CREATININE 9.47* 8.75* 10.97*  CALCIUM 10.1 9.6 10.1   Liver Function Tests: No results for input(s): AST, ALT, ALKPHOS, BILITOT, PROT, ALBUMIN in the last 168 hours. No results for input(s): LIPASE, AMYLASE in the last 168 hours. No results for input(s): AMMONIA in the last 168 hours. CBC:  Recent Labs Lab 09/29/15 1242 10/01/15 2130 10/03/15 1854  WBC 5.2 6.8 6.0  NEUTROABS  --  4.5  --   HGB 12.2 11.1* 10.7*  HCT 38.7 35.9* 33.7*  MCV 93.0 91.8 91.8  PLT 168 180 207   Cardiac Enzymes: No results for input(s): CKTOTAL, CKMB, CKMBINDEX, TROPONINI in the last 168 hours.  BNP (last 3 results)  Recent Labs  06/29/15 1615 09/29/15 1242  BNP >4500.0* 2492.9*    ProBNP (last 3  results) No results for input(s): PROBNP in the last 8760 hours.  CBG: No results for input(s): GLUCAP in the last 168 hours.  Radiological Exams on Admission: Dg Chest 2 View  10/03/2015  CLINICAL DATA:  Central chest pain for 2 days EXAM: CHEST - 2 VIEW COMPARISON:  10/01/2015 FINDINGS: Cardiomegaly is again noted. Defibrillator is again seen and stable. The lungs are clear bilaterally. No acute bony abnormality is noted. IMPRESSION: No change from the recent exam. Electronically Signed   By: Alcide Clever M.D.   On: 10/03/2015 17:14   Ct Angio Chest Pe W/cm &/or Wo Cm  10/03/2015  CLINICAL DATA:  Acute chest pain. EXAM: CT ANGIOGRAPHY CHEST WITH CONTRAST TECHNIQUE: Multidetector CT imaging of the chest was performed using the standard protocol during bolus administration of intravenous contrast. Multiplanar CT image reconstructions and MIPs were obtained to evaluate the vascular anatomy. CONTRAST:  80mL OMNIPAQUE IOHEXOL 350 MG/ML SOLN COMPARISON:  Chest radiograph of same day. FINDINGS: Mild cardiomegaly is noted. No pneumothorax or pleural effusion is noted. Minimal left posterior basilar subsegmental atelectasis is noted. Right lung is clear. There is no evidence of thoracic aortic dissection or aneurysm. Right-sided pacemaker is noted. No mediastinal mass or adenopathy is noted. Hepatomegaly is noted in the visualized portion of the upper abdomen. Mild pericardial effusion is noted. Some degree of ascites is noted. Review of the MIP images confirms the above findings. IMPRESSION: Mild cardiomegaly with mild pericardial effusion. No evidence of pulmonary embolus. Minimal left posterior basilar subsegmental atelectasis is noted. Hepatomegaly is noted with probable ascites. Electronically Signed   By: Lupita Raider, M.D.   On: 10/03/2015 21:42     EKG: Independently reviewed. Normal Sinus Rhythm rate = 98,  Prolonged Q-T interval    Assessment/Plan:      24 y.o. female with  Principal  Problem:   1.   Hyperkalemia   Cardiac Monitoring   Administered IV Dextrose, Insulin and Calcium and Kayexalate 45 grams PO x 1    Re-Check K+ level in 6 hours   Dialysis for further Rx   Active Problems:   2.    Uremia   Dialysis for Rx     3.   Chest pain   Cardiac monitoring   Cycle Troponins     4.   Fluid  overload   Dialysis for Rx     5.   [facility]   Dialysis for Rx   Case Management for Assistance with placement to a Dialysis Center   Continue Cinacalcet and Sevelamer Rx     6.   Systolic CHF, chronic (HCC)   Dialysis for Rx   Continue Carvedilol Rx, and Imdur     7.   Hypertension   Dialysis for fluid Overload   Continue Carvedilol, Amlodipine,    Monitor BPs     8.   Chronic pain   PRN Pain Control     9.   Anemia of renal disease   Monitor Trend   10.   Sickle cell anemia (HCC)- not in Crisis   Pain control with IV Dilaudid   11.   DVT Prophylaxis   SQ Heparin    Code Status:     FULL CODE       Family Communication: No Family Present   Disposition Plan:    Inpatient Status        Time spent:  46 Minutes      Ron Parker [facility] Pager [phone]   If 7AM -7PM Please Contact the Day Rounding Team MD for [facility]  If 7PM-7AM, Please Contact Night-Floor Coverage  [email or web address] Password [email or web address] 10/03/2015, 10:02 PM     ADDENDUM:   Patient was seen and examined on 10/03/2015

## 2015-10-04 DIAGNOSIS — R0789 Other chest pain: Secondary | ICD-10-CM

## 2015-10-04 DIAGNOSIS — D631 Anemia in chronic kidney disease: Secondary | ICD-10-CM | POA: Diagnosis present

## 2015-10-04 DIAGNOSIS — D571 Sickle-cell disease without crisis: Secondary | ICD-10-CM | POA: Diagnosis present

## 2015-10-04 DIAGNOSIS — R7989 Other specified abnormal findings of blood chemistry: Secondary | ICD-10-CM

## 2015-10-04 DIAGNOSIS — G8929 Other chronic pain: Secondary | ICD-10-CM

## 2015-10-04 DIAGNOSIS — N185 Chronic kidney disease, stage 5: Secondary | ICD-10-CM

## 2015-10-04 DIAGNOSIS — D696 Thrombocytopenia, unspecified: Secondary | ICD-10-CM

## 2015-10-04 DIAGNOSIS — R079 Chest pain, unspecified: Secondary | ICD-10-CM | POA: Diagnosis present

## 2015-10-04 LAB — CBC
HCT: 34.2 % — ABNORMAL LOW (ref 36.0–46.0)
HEMOGLOBIN: 10.6 g/dL — AB (ref 12.0–15.0)
MCH: 28.1 pg (ref 26.0–34.0)
MCHC: 31 g/dL (ref 30.0–36.0)
MCV: 90.7 fL (ref 78.0–100.0)
Platelets: 133 10*3/uL — ABNORMAL LOW (ref 150–400)
RBC: 3.77 MIL/uL — AB (ref 3.87–5.11)
RDW: 20.4 % — ABNORMAL HIGH (ref 11.5–15.5)
WBC: 5.1 10*3/uL (ref 4.0–10.5)

## 2015-10-04 LAB — BASIC METABOLIC PANEL
ANION GAP: 16 — AB (ref 5–15)
BUN: 97 mg/dL — AB (ref 6–20)
CO2: 21 mmol/L — ABNORMAL LOW (ref 22–32)
Calcium: 8.9 mg/dL (ref 8.9–10.3)
Chloride: 102 mmol/L (ref 101–111)
Creatinine, Ser: 11.47 mg/dL — ABNORMAL HIGH (ref 0.44–1.00)
GFR, EST AFRICAN AMERICAN: 5 mL/min — AB (ref >60)
GFR, EST NON AFRICAN AMERICAN: 4 mL/min — AB (ref >60)
Glucose, Bld: 142 mg/dL — ABNORMAL HIGH (ref 65–99)
POTASSIUM: 6.3 mmol/L — AB (ref 3.5–5.1)
SODIUM: 139 mmol/L (ref 135–145)

## 2015-10-04 LAB — HEPATITIS B SURFACE ANTIGEN: HEP B S AG: NEGATIVE

## 2015-10-04 LAB — TROPONIN I: TROPONIN I: 0.11 ng/mL — AB (ref <0.031)

## 2015-10-04 MED ORDER — HYDROXYZINE HCL 25 MG PO TABS
ORAL_TABLET | ORAL | Status: AC
  Filled 2015-10-04: qty 1

## 2015-10-04 MED ORDER — CAMPHOR-MENTHOL 0.5-0.5 % EX LOTN
1.0000 "application " | TOPICAL_LOTION | Freq: Three times a day (TID) | CUTANEOUS | Status: DC | PRN
  Filled 2015-10-04: qty 222

## 2015-10-04 MED ORDER — CARVEDILOL 6.25 MG PO TABS
6.2500 mg | ORAL_TABLET | Freq: Two times a day (BID) | ORAL | Status: DC
  Administered 2015-10-04 – 2015-10-05 (×3): 6.25 mg via ORAL
  Filled 2015-10-04 (×3): qty 1

## 2015-10-04 MED ORDER — LORAZEPAM 1 MG PO TABS
1.0000 mg | ORAL_TABLET | Freq: Every day | ORAL | Status: DC
  Administered 2015-10-04 – 2015-10-05 (×2): 1 mg via ORAL
  Filled 2015-10-04 (×2): qty 1

## 2015-10-04 MED ORDER — LIDOCAINE HCL (PF) 1 % IJ SOLN: 5.0000 mL | INTRAMUSCULAR | Status: DC | PRN

## 2015-10-04 MED ORDER — HYDROMORPHONE HCL 1 MG/ML IJ SOLN
INTRAMUSCULAR | Status: AC
  Administered 2015-10-04: 1 mg via INTRAVENOUS
  Filled 2015-10-04: qty 1

## 2015-10-04 MED ORDER — HEPARIN SODIUM (PORCINE) 1000 UNIT/ML DIALYSIS: 1000.0000 [IU] | INTRAMUSCULAR | Status: DC | PRN

## 2015-10-04 MED ORDER — ISOSORBIDE DINITRATE 10 MG PO TABS
10.0000 mg | ORAL_TABLET | Freq: Three times a day (TID) | ORAL | Status: DC
  Administered 2015-10-04 – 2015-10-05 (×4): 10 mg via ORAL
  Filled 2015-10-04 (×7): qty 1

## 2015-10-04 MED ORDER — ENOXAPARIN SODIUM 60 MG/0.6ML ~~LOC~~ SOLN: 1.0000 mg/kg | Freq: Two times a day (BID) | SUBCUTANEOUS | Status: DC

## 2015-10-04 MED ORDER — ACETAMINOPHEN 325 MG PO TABS: 650.0000 mg | ORAL_TABLET | Freq: Four times a day (QID) | ORAL | Status: DC | PRN

## 2015-10-04 MED ORDER — ONDANSETRON HCL 4 MG PO TABS: 4.0000 mg | ORAL_TABLET | Freq: Four times a day (QID) | ORAL | Status: DC | PRN

## 2015-10-04 MED ORDER — SODIUM CHLORIDE 0.9 % IV SOLN: 250.0000 mL | INTRAVENOUS | Status: DC | PRN

## 2015-10-04 MED ORDER — ACETAMINOPHEN 650 MG RE SUPP: 650.0000 mg | Freq: Four times a day (QID) | RECTAL | Status: DC | PRN

## 2015-10-04 MED ORDER — CINACALCET HCL 30 MG PO TABS
30.0000 mg | ORAL_TABLET | Freq: Every day | ORAL | Status: DC
  Administered 2015-10-04: 30 mg via ORAL
  Filled 2015-10-04: qty 1

## 2015-10-04 MED ORDER — ALUM & MAG HYDROXIDE-SIMETH 200-200-20 MG/5ML PO SUSP: 30.0000 mL | Freq: Four times a day (QID) | ORAL | Status: DC | PRN

## 2015-10-04 MED ORDER — SODIUM CHLORIDE 0.9 % IJ SOLN
3.0000 mL | INTRAMUSCULAR | Status: DC | PRN
  Administered 2015-10-05: 3 mL via INTRAVENOUS
  Filled 2015-10-04: qty 3

## 2015-10-04 MED ORDER — SEVELAMER CARBONATE 800 MG PO TABS
2400.0000 mg | ORAL_TABLET | Freq: Three times a day (TID) | ORAL | Status: DC
  Administered 2015-10-04 – 2015-10-05 (×4): 2400 mg via ORAL
  Filled 2015-10-04 (×5): qty 3

## 2015-10-04 MED ORDER — LIDOCAINE-PRILOCAINE 2.5-2.5 % EX CREA: 1.0000 "application " | TOPICAL_CREAM | CUTANEOUS | Status: DC | PRN

## 2015-10-04 MED ORDER — ALTEPLASE 2 MG IJ SOLR: 2.0000 mg | Freq: Once | INTRAMUSCULAR | Status: DC | PRN

## 2015-10-04 MED ORDER — HYDROMORPHONE HCL 1 MG/ML IJ SOLN
0.5000 mg | INTRAMUSCULAR | Status: DC | PRN
  Administered 2015-10-04 – 2015-10-05 (×13): 1 mg via INTRAVENOUS
  Filled 2015-10-04 (×12): qty 1

## 2015-10-04 MED ORDER — SODIUM CHLORIDE 0.9 % IJ SOLN
3.0000 mL | Freq: Two times a day (BID) | INTRAMUSCULAR | Status: DC
  Administered 2015-10-04 – 2015-10-05 (×4): 3 mL via INTRAVENOUS

## 2015-10-04 MED ORDER — OXYCODONE HCL 5 MG PO TABS: 5.0000 mg | ORAL_TABLET | ORAL | Status: DC | PRN

## 2015-10-04 MED ORDER — ONDANSETRON HCL 4 MG/2ML IJ SOLN: 4.0000 mg | Freq: Four times a day (QID) | INTRAMUSCULAR | Status: DC | PRN

## 2015-10-04 MED ORDER — SODIUM CHLORIDE 0.9 % IV SOLN: 100.0000 mL | INTRAVENOUS | Status: DC | PRN

## 2015-10-04 MED ORDER — HEPARIN SODIUM (PORCINE) 5000 UNIT/ML IJ SOLN
5000.0000 [IU] | Freq: Three times a day (TID) | INTRAMUSCULAR | Status: DC
  Filled 2015-10-04 (×2): qty 1

## 2015-10-04 MED ORDER — HYDROXYZINE HCL 25 MG PO TABS
25.0000 mg | ORAL_TABLET | Freq: Three times a day (TID) | ORAL | Status: DC | PRN
  Administered 2015-10-04: 25 mg via ORAL

## 2015-10-04 MED ORDER — AMLODIPINE BESYLATE 10 MG PO TABS
10.0000 mg | ORAL_TABLET | Freq: Every day | ORAL | Status: DC
  Administered 2015-10-04: 10 mg via ORAL
  Filled 2015-10-04: qty 1

## 2015-10-04 MED ORDER — PENTAFLUOROPROP-TETRAFLUOROETH EX AERO: 1.0000 "application " | INHALATION_SPRAY | CUTANEOUS | Status: DC | PRN

## 2015-10-04 MED ORDER — HEPARIN SODIUM (PORCINE) 1000 UNIT/ML DIALYSIS: 2000.0000 [IU] | Freq: Once | INTRAMUSCULAR | Status: DC

## 2015-10-04 NOTE — Care Management (Signed)
10/04/2015 Noted CM consult for PCP and HD. Please be aware of pt hx of noncompliance. HD can be arranged only upon the order of nephrology and only then if the HD provider agrees to accept the pt. This Pt's hx of noncompliance has made her impossible to place at a HD center in [facility]. I have spoke with the pt and her mother this afternoon and the pt is very aware of the problem she has due to her noncompliance. I have explained in detail to the mother why we are unable to place this pt in a local center.  I will be happy to schedule an appointment for this pt at the [facility] and [facility] for PCP services.

## 2015-10-04 NOTE — Progress Notes (Signed)
[facility] progress note. Chief complaint. Transfer note. This 24 year old female with chronic kidney disease on dialysis. She came to the emergency room at [facility] long was found to be hyperkalemic with a potassium of 6.9. She did receive oral Kayexalate. Patient was also complaining of chest pain and was felt to require transfer to [facility] for further treatment. She has arrived in transfer and I'm seeing her at bedside to ensure she remains clinically stable and that her orders transferred here appropriately. Physical exam. Vital signs. Temperature 98.8, pulse 106, respiration 19, blood pressure 123/106. O2 sats 98%. General appearance. Well-developed young female who is alert and in no distress. Cardiac. Rate and rhythm regular. Lungs. Breath sounds clear and equal. Abdomen. Soft with positive bowel sounds. Mildly protuberant. No pain. Impression/plan. Problem #1. Hyperkalemia. Patient is artery been consulted by nephrology. Plan hemodialysis in the a.m. Problem #2. Chest pain. Follow ordered troponins. Problem #3. Hypertension. Continue home medications. Problem #4. Anemia. Hemoglobin looks to be about baseline. Follow along during hospitalization. Patient appears clinically stable post transfer. All orders appear to of transferred appropriately.

## 2015-10-04 NOTE — Progress Notes (Signed)
  [facility] Progress Note   Subjective: feeling a bit better, on HD now.   Filed Vitals:   10/04/15 0830 10/04/15 0900 10/04/15 0930 10/04/15 1000  BP: 125/73 131/78 132/76 128/83  Pulse: 94 92 90 94  Temp:      TempSrc:      Resp:      Height:      Weight:      SpO2:        Inpatient medications: . amLODipine  10 mg Oral QHS  . carvedilol  6.25 mg Oral BID WC  . cinacalcet  30 mg Oral Q supper  . heparin  2,000 Units Dialysis Once in dialysis  . heparin  5,000 Units Subcutaneous 3 times per day  . isosorbide dinitrate  10 mg Oral TID  . LORazepam  1 mg Oral Daily  . nitroGLYCERIN  1 inch Topical 4 times per day  . sevelamer carbonate  2,400 mg Oral TID WC  . sodium chloride  3 mL Intravenous Q12H     Exam: Alert, minimal facial edema +jvd Chest clear bilat RRR no mrg Abd soft ntnd no mass or ascites No LE or UE edema Alert no asterixis L arm AVF +bruit   Last HD in [place], NCTTS HD w dry wt ~50kg per pt [facility] orders from May 2016  > HD TTS 3h Hep 2000 LUA AVF      Assessment: 1 Hyperkalemia on HD now 2 ESRD is without local HD unit now 3 Vol excess, up 8kg  4 HTN amlod/ coreg 5 MBD renvela 6 Anemia Hb 10.7, no esa  Plan - HD today and tomorrow. Should be ok for dc after HD tomorrow.    [name] Moselle MD [facility] pager [phone]    cell [phone] 10/04/2015, 10:13 AM    Recent Labs Lab 10/01/15 2130 10/03/15 1854 10/04/15 0616  NA 138 139 139  K 5.7* 6.9* 6.3*  CL 98* 100* 102  CO2 25 21* 21*  GLUCOSE 92 87 142*  BUN 68* 99* 97*  CREATININE 8.75* 10.97* 11.47*  CALCIUM 9.6 10.1 8.9   No results for input(s): AST, ALT, ALKPHOS, BILITOT, PROT, ALBUMIN in the last 168 hours.  Recent Labs Lab 10/01/15 2130 10/03/15 1854 10/04/15 0348  WBC 6.8 6.0 5.1  NEUTROABS 4.5  --   --   HGB 11.1* 10.7* 10.6*  HCT 35.9* 33.7* 34.2*  MCV 91.8 91.8 90.7  PLT 180 207 133*

## 2015-10-04 NOTE — Progress Notes (Addendum)
Patient ID: [name] Small, female   DOB: 12-Sep-1991, 24 y.o.   MRN: [id number] [facility] PROGRESS NOTE  Ridley Dileo NWG:[id number] DOB: 25-Dec-1990 DOA: 10/03/2015   PCP: [place] is moving [place] to [place], has no PCP or dialysis center established yet, CM consult for assistance   Brief narrative:    24 y.o. female with known Polycystic Kidney Disease, S/P failed Cadaveric Renal Transplant in 2008 (at [facility]) on dialysis MWF for the past 6 years who presented to the ED with complaints of sharp, right sided chest pain x 3 days. She was seen in the ED 2 days ago for chest pain.   In ED, pt was hemodynamically stable, initial troponin 0.11, K 6.9, nephrology team consulted for assistance.   Assessment/Plan:    Principal Problem:   ESRD (end stage renal disease) on dialysis (HCC) - management per nephrology team, appreciate assistance   Active Problems:   Hyperkalemia - address with HD - renal panel in AM    Systolic CHF, chronic (HCC) - volume control with HD    Anemia of chronic renal failure, stage 5 (HCC) - no sings of bleeding - CBC in AM    Essential HTN - continue Norvasc, coreg, isosorbide dinitrate     Thrombocytopenia (HCC) - mild, chronic - CBC in AM    Chest pain with chronically Elevated troponin - in the setting of ESRD, volume overload  - no chest pain this am - monitor on telemetry     Sickle cell anemia (HCC) - stable for now   DVT prophylaxis - Heparin SQ  Code Status: Full.  Family Communication:  plan of care discussed with the patient Disposition Plan: Home when cleared by renal team, ? By 12/13  IV access:  Left arm fistula   Procedures and diagnostic studies:    Dg Chest 2 View 10/03/2015 No change from the recent exam.   Dg Chest 2 View 10/01/2015  No active cardiopulmonary disease.  Cardiomegaly again noted.  Dg Chest 2 View 09/29/2015  Chronic cardiomegaly without failure or other acute finding.   Ct Angio Chest Pe W/cm &/or  Wo Cm 10/03/2015  Mild cardiomegaly with mild pericardial effusion. No evidence of pulmonary embolus. Minimal left posterior basilar subsegmental atelectasis is noted. Hepatomegaly is noted with probable ascites.  Dg Chest Port 1 View 09/20/2015  Stable cardiomegaly.  No acute cardiopulmonary findings.  Medical Consultants:  Nephrology   Other Consultants:  None  IAnti-Infectives:   None  Debbora Presto, MD  [facility] Pager [phone]  If 7PM-7AM, please contact night-coverage [email or web address] Password [facility] 10/04/2015, 3:55 PM   LOS: 1 day   HPI/Subjective: No events overnight.   Objective: Filed Vitals:   10/04/15 1030 10/04/15 1052 10/04/15 1146 10/04/15 1216  BP: 121/65 130/86 127/91   Pulse: 92 92 94   Temp:  97.8 F (36.6 C) 97.9 F (36.6 C) 97.7 F (36.5 C)  TempSrc:  Oral Oral Oral  Resp:  18 20   Height:      Weight:  54.4 kg (119 lb 14.9 oz)    SpO2:  98% 99%     Intake/Output Summary (Last 24 hours) at 10/04/15 1555 Last data filed at 10/04/15 1200  Gross per 24 hour  Intake    840 ml  Output   4000 ml  Net  -3160 ml    Exam:   General:  Pt is alert, follows commands appropriately, not in acute distress  Cardiovascular: Regular rate and rhythm, no rubs, no gallops  Respiratory: Clear to auscultation bilaterally, no wheezing, no crackles, no rhonchi  Abdomen: Soft, non tender, non distended, bowel sounds present, no guarding  Data Reviewed: Basic Metabolic Panel:  Recent Labs Lab 09/29/15 1242 10/01/15 2130 10/03/15 1854 10/04/15 0616  NA 139 138 139 139  K 5.0 5.7* 6.9* 6.3*  CL 100* 98* 100* 102  CO2 22 25 21* 21*  GLUCOSE 82 92 87 142*  BUN 62* 68* 99* 97*  CREATININE 9.47* 8.75* 10.97* 11.47*  CALCIUM 10.1 9.6 10.1 8.9   CBC:  Recent Labs Lab 09/29/15 1242 10/01/15 2130 10/03/15 1854 10/04/15 0348  WBC 5.2 6.8 6.0 5.1  NEUTROABS  --  4.5  --   --   HGB 12.2 11.1* 10.7* 10.6*  HCT 38.7 35.9* 33.7* 34.2*  MCV 93.0 91.8  91.8 90.7  PLT 168 180 207 133*   Cardiac Enzymes:  Recent Labs Lab 10/03/15 2219 10/04/15 0348  TROPONINI 0.11* 0.11*   Scheduled Meds: . amLODipine  10 mg Oral QHS  . carvedilol  6.25 mg Oral BID WC  . cinacalcet  30 mg Oral Q supper  . heparin  5,000 Units Subcutaneous 3 times per day  . isosorbide dinitrate  10 mg Oral TID  . LORazepam  1 mg Oral Daily  . nitroGLYCERIN  1 inch Topical 4 times per day  . sevelamer carbonate  2,400 mg Oral TID WC  . sodium chloride  3 mL Intravenous Q12H   Continuous Infusions:

## 2015-10-04 NOTE — Progress Notes (Signed)
  [facility] Progress Note   Subjective: patient moved to [place] area from [place] area but has been turned by [facility] due to prior history of noncompliance.  She has no OP unit here in [place].  I told her she could come to hospital for dialysis but she says she has come to the ED and they have dc'd her home since her "potassium isn't high enough" on more than one occasion this past week. Living w her parents.   Home unit is in [place].  Came to ED, K 6.9, rec'd po kayexalate.   Filed Vitals:   10/03/15 2006 10/03/15 2158 10/03/15 2200 10/03/15 2337  BP: 146/111 133/106 139/113 123/106  Pulse: 118 107  106  Temp:  98.8 F (37.1 C)    TempSrc:  Oral    Resp: 18 20 19    SpO2: 97% 98%  98%    Inpatient medications: . nitroGLYCERIN  1 inch Topical 4 times per day     Exam: Alert, minimal facial edema +jvd Chest clear bilat RRR no mrg Abd soft ntnd no mass or ascites No LE or UE edema Alert no asterixis L arm AVF +bruit   Last HD in [place], NCTTS HD w dry wt ~50kg per pt [facility] orders from May 2016  > HD TTS 3h Hep 2000 LUA AVF      Assessment: 1 Hyperkalemia 2 ESRD is without local HD unit now 3 Vol excess, not severe 4 HTN amlod/ coreg 5 MBD renvela 6 Anemia Hb 10.7, no esa  Plan - HD first shift Monday am.  Should prob keep her for another HD on Tuesday, has had only one HD session in last 8 days.    Vinson Moselle MD [facility] pager [phone]    cell [phone] 10/04/2015, 12:17 AM    Recent Labs Lab 09/29/15 1242 10/01/15 2130 10/03/15 1854  NA 139 138 139  K 5.0 5.7* 6.9*  CL 100* 98* 100*  CO2 22 25 21*  GLUCOSE 82 92 87  BUN 62* 68* 99*  CREATININE 9.47* 8.75* 10.97*  CALCIUM 10.1 9.6 10.1   No results for input(s): AST, ALT, ALKPHOS, BILITOT, PROT, ALBUMIN in the last 168 hours.  Recent Labs Lab 09/29/15 1242 10/01/15 2130 10/03/15 1854  WBC 5.2 6.8 6.0  NEUTROABS  --  4.5  --   HGB 12.2 11.1*  10.7*  HCT 38.7 35.9* 33.7*  MCV 93.0 91.8 91.8  PLT 168 180 207

## 2015-10-05 LAB — CBC
HEMATOCRIT: 33.3 % — AB (ref 36.0–46.0)
HEMOGLOBIN: 10.3 g/dL — AB (ref 12.0–15.0)
MCH: 28.1 pg (ref 26.0–34.0)
MCHC: 30.9 g/dL (ref 30.0–36.0)
MCV: 91 fL (ref 78.0–100.0)
Platelets: 183 10*3/uL (ref 150–400)
RBC: 3.66 MIL/uL — AB (ref 3.87–5.11)
RDW: 20.1 % — ABNORMAL HIGH (ref 11.5–15.5)
WBC: 6.8 10*3/uL (ref 4.0–10.5)

## 2015-10-05 LAB — RENAL FUNCTION PANEL
ALBUMIN: 3.1 g/dL — AB (ref 3.5–5.0)
ANION GAP: 16 — AB (ref 5–15)
BUN: 52 mg/dL — ABNORMAL HIGH (ref 6–20)
CALCIUM: 8.7 mg/dL — AB (ref 8.9–10.3)
CO2: 26 mmol/L (ref 22–32)
Chloride: 96 mmol/L — ABNORMAL LOW (ref 101–111)
Creatinine, Ser: 7.69 mg/dL — ABNORMAL HIGH (ref 0.44–1.00)
GFR, EST AFRICAN AMERICAN: 8 mL/min — AB (ref >60)
GFR, EST NON AFRICAN AMERICAN: 7 mL/min — AB (ref >60)
GLUCOSE: 105 mg/dL — AB (ref 65–99)
PHOSPHORUS: 6.4 mg/dL — AB (ref 2.5–4.6)
POTASSIUM: 3.2 mmol/L — AB (ref 3.5–5.1)
Sodium: 138 mmol/L (ref 135–145)

## 2015-10-05 MED ORDER — HEPARIN SODIUM (PORCINE) 1000 UNIT/ML DIALYSIS: 1000.0000 [IU] | INTRAMUSCULAR | Status: DC | PRN

## 2015-10-05 MED ORDER — OXYCODONE HCL 10 MG PO TABS: 10.0000 mg | ORAL_TABLET | Freq: Three times a day (TID) | ORAL | Status: DC | PRN

## 2015-10-05 MED ORDER — PENTAFLUOROPROP-TETRAFLUOROETH EX AERO: 1.0000 "application " | INHALATION_SPRAY | CUTANEOUS | Status: DC | PRN

## 2015-10-05 MED ORDER — SODIUM CHLORIDE 0.9 % IV SOLN: 100.0000 mL | INTRAVENOUS | Status: DC | PRN

## 2015-10-05 MED ORDER — HEPARIN SODIUM (PORCINE) 1000 UNIT/ML DIALYSIS: 2000.0000 [IU] | Freq: Once | INTRAMUSCULAR | Status: DC

## 2015-10-05 MED ORDER — LIDOCAINE-PRILOCAINE 2.5-2.5 % EX CREA
1.0000 "application " | TOPICAL_CREAM | CUTANEOUS | Status: DC | PRN
  Filled 2015-10-05: qty 5

## 2015-10-05 MED ORDER — LIDOCAINE HCL (PF) 1 % IJ SOLN: 5.0000 mL | INTRAMUSCULAR | Status: DC | PRN

## 2015-10-05 MED ORDER — HYDROMORPHONE HCL 1 MG/ML IJ SOLN
INTRAMUSCULAR | Status: AC
  Filled 2015-10-05: qty 1

## 2015-10-05 MED ORDER — ALTEPLASE 2 MG IJ SOLR
2.0000 mg | Freq: Once | INTRAMUSCULAR | Status: DC | PRN
  Filled 2015-10-05: qty 2

## 2015-10-05 NOTE — Progress Notes (Signed)
  [facility] Progress Note   Subjective: feeling a bit better, on HD now.   Filed Vitals:   10/04/15 1625 10/04/15 1658 10/04/15 2057 10/05/15 0445  BP: 107/66 109/84 111/74 114/88  Pulse: 101 98 100 50  Temp: 98.4 F (36.9 C)  98.2 F (36.8 C) 98.2 F (36.8 C)  TempSrc: Oral  Oral Oral  Resp: 18  19 20   Height:      Weight:   54.6 kg (120 lb 5.9 oz)   SpO2: 100%  100% 100%    Inpatient medications: . amLODipine  10 mg Oral QHS  . carvedilol  6.25 mg Oral BID WC  . cinacalcet  30 mg Oral Q supper  . [START ON 10/06/2015] heparin  2,000 Units Dialysis Once in dialysis  . heparin  5,000 Units Subcutaneous 3 times per day  . isosorbide dinitrate  10 mg Oral TID  . LORazepam  1 mg Oral Daily  . nitroGLYCERIN  1 inch Topical 4 times per day  . sevelamer carbonate  2,400 mg Oral TID WC  . sodium chloride  3 mL Intravenous Q12H     Exam: Alert, minimal facial edema +jvd Chest clear bilat RRR no mrg Abd soft ntnd no mass or ascites No LE or UE edema Alert no asterixis L arm AVF +bruit   Last HD in [place], NCTTS HD w dry wt ~50kg per pt [facility] orders from May 2016  > HD TTS 3h Hep 2000 LUA AVF      Assessment: 1 Hyperkalemia resolved 2 ESRD is without local HD unit 3 Vol excess up 5 kg today 4 HTN amlod/ coreg 5 MBD renvela 6 Anemia Hb 10.7, no esa  Plan - HD today, UF to dry wt. OK for dc after HD. Patient knows to return to ED and request inpatient dialysis.    [name] Moselle MD [facility] pager [phone]    cell [phone] 10/05/2015, 9:32 AM    Recent Labs Lab 10/03/15 1854 10/04/15 0616 10/05/15 0520  NA 139 139 138  K 6.9* 6.3* 3.2*  CL 100* 102 96*  CO2 21* 21* 26  GLUCOSE 87 142* 105*  BUN 99* 97* 52*  CREATININE 10.97* 11.47* 7.69*  CALCIUM 10.1 8.9 8.7*  PHOS  --   --  6.4*    Recent Labs Lab 10/05/15 0520  ALBUMIN 3.1*    Recent Labs Lab 10/01/15 2130 10/03/15 1854  10/04/15 0348 10/05/15 0520  WBC 6.8 6.0 5.1 6.8  NEUTROABS 4.5  --   --   --   HGB 11.1* 10.7* 10.6* 10.3*  HCT 35.9* 33.7* 34.2* 33.3*  MCV 91.8 91.8 90.7 91.0  PLT 180 207 133* 183

## 2015-10-05 NOTE — Progress Notes (Signed)
Dialysis treatment completed.  4500 mL ultrafiltrated and net fluid removal 4000 mL.    Patient status unchanged. Lung sounds clear to ausculation in all fields. No edema. Cardiac: Regular R&R.  Disconnected lines and removed needles.  Pressure held for 10 minutes and band aid/gauze dressing applied.  Report given to bedside RN, Mathis Fare.

## 2015-10-05 NOTE — Progress Notes (Signed)
Patient arrived to unit per bed.  Reviewed treatment plan and this RN agrees.  Report received from bedside RN, Mathis Fare.  Consent verified.  Patient A & O X 4. Lung sounds clear to ausculation in all fields. Generalized edema. Cardiac: Regular R&R.  Prepped LUAVF with alcohol and cannulated with two 15 gauge needles.  Pulsation of blood noted.  Flushed access well with saline per protocol.  Connected and secured lines and initiated tx at 0936.  UF goal of 5000 mL and net fluid removal of 4500 mL.  Will continue to monitor.

## 2015-10-05 NOTE — Discharge Instructions (Signed)

## 2015-10-05 NOTE — Discharge Summary (Signed)
Physician Discharge Summary  [name] Small KXF:[id number] DOB: [birth year] DOA: 10/03/2015  PCP: No PCP Per Patient  Admit date: 10/03/2015 Discharge date: 10/05/2015  Recommendations for Outpatient Follow-up:  1. Pt will need to follow up with PCP in 2-3 weeks post discharge 2. Pt declined our offer to have HD set up, wants to go back to [place] area  Discharge Diagnoses:  Principal Problem:   ESRD (end stage renal disease) on dialysis [facility]) Active Problems:   Hyperkalemia   Chest pain of uncertain etiology   Systolic CHF, chronic (HCC)   Anemia of chronic renal failure, stage 5 (HCC)   Thrombocytopenia (HCC)   Chronically Elevated troponin   Sickle cell anemia (HCC)  Discharge Condition: Stable  Diet recommendation: Heart healthy diet discussed in details    PCP: [place] is moving [place], has no PCP or dialysis center established yet, CM consult for assistance   Brief narrative:    24 y.o. female with known Polycystic Kidney Disease, S/P failed Cadaveric Renal Transplant in 2008 (at [facility]) on dialysis MWF for the past 6 years who presented to the ED with complaints of sharp, right sided chest pain x 3 days. She was seen in the ED 2 days ago for chest pain.   In ED, pt was hemodynamically stable, initial troponin 0.11, K 6.9, nephrology team consulted for assistance.   Assessment/Plan:    Principal Problem:  ESRD (end stage renal disease) on dialysis (HCC) - management per nephrology team, appreciate assistance  - OK to d/c from renal standpoint after HD   Active Problems:  Hyperkalemia - address with HD   Systolic CHF, chronic (HCC) - volume control with HD   Anemia of chronic renal failure, stage 5 (HCC) - no sings of bleeding   Essential HTN - continue Norvasc, coreg, isosorbide dinitrate    Thrombocytopenia (HCC) - mild, chronic   Chest pain with chronically Elevated troponin - in the setting of ESRD, volume overload  - no chest  pain this am   Sickle cell anemia (HCC) - stable for now    Code Status: Full.  Family Communication: plan of care discussed with the patient Disposition Plan: Home   IV access:  Left arm fistula   Procedures and diagnostic studies:   Dg Chest 2 View 10/03/2015 No change from the recent exam.   Dg Chest 2 View 10/01/2015 No active cardiopulmonary disease. Cardiomegaly again noted.  Dg Chest 2 View 09/29/2015 Chronic cardiomegaly without failure or other acute finding.   Ct Angio Chest Pe W/cm &/or Wo Cm 10/03/2015 Mild cardiomegaly with mild pericardial effusion. No evidence of pulmonary embolus. Minimal left posterior basilar subsegmental atelectasis is noted. Hepatomegaly is noted with probable ascites.  Dg Chest Port 1 View 09/20/2015 Stable cardiomegaly. No acute cardiopulmonary findings.  Medical Consultants:  Nephrology   Other Consultants:  None  IAnti-Infectives:   None  Debbora Presto, MD [facility] Pager [phone]       Discharge Exam: Filed Vitals:   10/05/15 0936 10/05/15 1007  BP: 103/76 121/73  Pulse: 99 86  Temp:    Resp:     Filed Vitals:   10/05/15 0445 10/05/15 0927 10/05/15 0936 10/05/15 1007  BP: 114/88 114/77 103/76 121/73  Pulse: 50 102 99 86  Temp: 98.2 F (36.8 C) 98.1 F (36.7 C)    TempSrc: Oral Oral    Resp: 20 16    Height:      Weight:  56.5 kg (124 lb 9 oz)  SpO2: 100%       General: Pt is alert, follows commands appropriately, not in acute distress Cardiovascular: Regular rate and rhythm, S1/S2 +, no murmurs, no rubs, no gallops Respiratory: Clear to auscultation bilaterally, no wheezing, no crackles, no rhonchi Abdominal: Soft, non tender, non distended, bowel sounds +, no guarding  Discharge Instructions  Discharge Instructions    Diet - low sodium heart healthy    Complete by:  As directed      Increase activity slowly    Complete by:  As directed              Medication List    STOP taking these medications        senna 8.6 MG Tabs tablet  Commonly known as:  SENOKOT      TAKE these medications        amLODipine 10 MG tablet  Commonly known as:  NORVASC  Take 1 tablet (10 mg total) by mouth at bedtime.     carvedilol 6.25 MG tablet  Commonly known as:  COREG  Take 1 tablet (6.25 mg total) by mouth 2 (two) times daily.     cinacalcet 30 MG tablet  Commonly known as:  SENSIPAR  Take 1 tablet (30 mg total) by mouth daily with supper.     isosorbide dinitrate 10 MG tablet  Commonly known as:  ISORDIL  Take 1 tablet (10 mg total) by mouth 3 (three) times daily.     LORazepam 1 MG tablet  Commonly known as:  ATIVAN  Take 1 mg by mouth daily. Patient states she takes one tablet by mouth every day per patient     Oxycodone HCl 10 MG Tabs  Take 1 tablet (10 mg total) by mouth every 8 (eight) hours as needed. For pain     sevelamer carbonate 800 MG tablet  Commonly known as:  RENVELA  Take 3 tablets (2,400 mg total) by mouth 3 (three) times daily with meals.          The results of significant diagnostics from this hospitalization (including imaging, microbiology, ancillary and laboratory) are listed below for reference.     Microbiology: No results found for this or any previous visit (from the past 240 hour(s)).   Labs: Basic Metabolic Panel:  Recent Labs Lab 09/29/15 1242 10/01/15 2130 10/03/15 1854 10/04/15 0616 10/05/15 0520  NA 139 138 139 139 138  K 5.0 5.7* 6.9* 6.3* 3.2*  CL 100* 98* 100* 102 96*  CO2 22 25 21* 21* 26  GLUCOSE 82 92 87 142* 105*  BUN 62* 68* 99* 97* 52*  CREATININE 9.47* 8.75* 10.97* 11.47* 7.69*  CALCIUM 10.1 9.6 10.1 8.9 8.7*  PHOS  --   --   --   --  6.4*   Liver Function Tests:  Recent Labs Lab 10/05/15 0520  ALBUMIN 3.1*   No results for input(s): LIPASE, AMYLASE in the last 168 hours. No results for input(s): AMMONIA in the last 168 hours. CBC:  Recent Labs Lab  09/29/15 1242 10/01/15 2130 10/03/15 1854 10/04/15 0348 10/05/15 0520  WBC 5.2 6.8 6.0 5.1 6.8  NEUTROABS  --  4.5  --   --   --   HGB 12.2 11.1* 10.7* 10.6* 10.3*  HCT 38.7 35.9* 33.7* 34.2* 33.3*  MCV 93.0 91.8 91.8 90.7 91.0  PLT 168 180 207 133* 183   Cardiac Enzymes:  Recent Labs Lab 10/03/15 2219 10/04/15 0348  TROPONINI 0.11* 0.11*   BNP: BNP (  last 3 results)  Recent Labs  06/29/15 1615 09/29/15 1242  BNP >4500.0* 2492.9*    SIGNED: Time coordinating discharge: 30 minutes  Debbora Presto, MD  [facility] 10/05/2015, 10:42 AM Pager [phone]    If 7PM-7AM, please contact night-coverage [email or web address] Password [email or web address]

## 2015-10-10 ENCOUNTER — Inpatient Hospital Stay (HOSPITAL_COMMUNITY)
Admission: EM | Admit: 2015-10-10 | Discharge: 2015-10-12 | DRG: 683 | Disposition: A | Discharge status: Home / Self Care | Payer: Medicare Other | Attending: Internal Medicine | Admitting: Internal Medicine

## 2015-10-10 ENCOUNTER — Emergency Department (HOSPITAL_COMMUNITY): Payer: Medicare Other

## 2015-10-10 ENCOUNTER — Encounter (HOSPITAL_COMMUNITY): Payer: Self-pay | Admitting: Emergency Medicine

## 2015-10-10 DIAGNOSIS — N2581 Secondary hyperparathyroidism of renal origin: Secondary | ICD-10-CM | POA: Diagnosis present

## 2015-10-10 DIAGNOSIS — K219 Gastro-esophageal reflux disease without esophagitis: Secondary | ICD-10-CM | POA: Diagnosis present

## 2015-10-10 DIAGNOSIS — D571 Sickle-cell disease without crisis: Secondary | ICD-10-CM | POA: Diagnosis present

## 2015-10-10 DIAGNOSIS — F329 Major depressive disorder, single episode, unspecified: Secondary | ICD-10-CM | POA: Diagnosis present

## 2015-10-10 DIAGNOSIS — I5023 Acute on chronic systolic (congestive) heart failure: Secondary | ICD-10-CM | POA: Diagnosis present

## 2015-10-10 DIAGNOSIS — D631 Anemia in chronic kidney disease: Secondary | ICD-10-CM | POA: Diagnosis present

## 2015-10-10 DIAGNOSIS — R778 Other specified abnormalities of plasma proteins: Secondary | ICD-10-CM | POA: Diagnosis present

## 2015-10-10 DIAGNOSIS — I429 Cardiomyopathy, unspecified: Secondary | ICD-10-CM | POA: Diagnosis present

## 2015-10-10 DIAGNOSIS — R0789 Other chest pain: Secondary | ICD-10-CM

## 2015-10-10 DIAGNOSIS — Q613 Polycystic kidney, unspecified: Secondary | ICD-10-CM

## 2015-10-10 DIAGNOSIS — Z940 Kidney transplant status: Secondary | ICD-10-CM

## 2015-10-10 DIAGNOSIS — F17210 Nicotine dependence, cigarettes, uncomplicated: Secondary | ICD-10-CM | POA: Diagnosis present

## 2015-10-10 DIAGNOSIS — R0600 Dyspnea, unspecified: Secondary | ICD-10-CM | POA: Diagnosis not present

## 2015-10-10 DIAGNOSIS — Z992 Dependence on renal dialysis: Secondary | ICD-10-CM

## 2015-10-10 DIAGNOSIS — Z95810 Presence of automatic (implantable) cardiac defibrillator: Secondary | ICD-10-CM

## 2015-10-10 DIAGNOSIS — R7989 Other specified abnormal findings of blood chemistry: Secondary | ICD-10-CM

## 2015-10-10 DIAGNOSIS — I132 Hypertensive heart and chronic kidney disease with heart failure and with stage 5 chronic kidney disease, or end stage renal disease: Secondary | ICD-10-CM | POA: Diagnosis present

## 2015-10-10 DIAGNOSIS — G894 Chronic pain syndrome: Secondary | ICD-10-CM | POA: Diagnosis present

## 2015-10-10 DIAGNOSIS — I5022 Chronic systolic (congestive) heart failure: Secondary | ICD-10-CM | POA: Diagnosis present

## 2015-10-10 DIAGNOSIS — E8779 Other fluid overload: Secondary | ICD-10-CM

## 2015-10-10 DIAGNOSIS — Z79899 Other long term (current) drug therapy: Secondary | ICD-10-CM

## 2015-10-10 DIAGNOSIS — Z9115 Patient's noncompliance with renal dialysis: Secondary | ICD-10-CM

## 2015-10-10 DIAGNOSIS — N186 End stage renal disease: Principal | ICD-10-CM | POA: Diagnosis present

## 2015-10-10 DIAGNOSIS — Z86711 Personal history of pulmonary embolism: Secondary | ICD-10-CM

## 2015-10-10 DIAGNOSIS — N185 Chronic kidney disease, stage 5: Secondary | ICD-10-CM | POA: Diagnosis present

## 2015-10-10 DIAGNOSIS — E8770 Fluid overload, unspecified: Secondary | ICD-10-CM | POA: Diagnosis present

## 2015-10-10 DIAGNOSIS — R079 Chest pain, unspecified: Secondary | ICD-10-CM | POA: Diagnosis present

## 2015-10-10 DIAGNOSIS — I10 Essential (primary) hypertension: Secondary | ICD-10-CM | POA: Diagnosis present

## 2015-10-10 DIAGNOSIS — E875 Hyperkalemia: Secondary | ICD-10-CM | POA: Diagnosis present

## 2015-10-10 LAB — CBC WITH DIFFERENTIAL/PLATELET
BASOS ABS: 0 10*3/uL (ref 0.0–0.1)
Basophils Relative: 0 %
EOS PCT: 1 %
Eosinophils Absolute: 0.1 10*3/uL (ref 0.0–0.7)
HEMATOCRIT: 35 % — AB (ref 36.0–46.0)
Hemoglobin: 11.2 g/dL — ABNORMAL LOW (ref 12.0–15.0)
LYMPHS ABS: 1.9 10*3/uL (ref 0.7–4.0)
LYMPHS PCT: 27 %
MCH: 29.1 pg (ref 26.0–34.0)
MCHC: 32 g/dL (ref 30.0–36.0)
MCV: 90.9 fL (ref 78.0–100.0)
MONO ABS: 0.5 10*3/uL (ref 0.1–1.0)
Monocytes Relative: 6 %
NEUTROS ABS: 4.7 10*3/uL (ref 1.7–7.7)
Neutrophils Relative %: 66 %
Platelets: 149 10*3/uL — ABNORMAL LOW (ref 150–400)
RBC: 3.85 MIL/uL — AB (ref 3.87–5.11)
RDW: 19.9 % — AB (ref 11.5–15.5)
WBC: 7.2 10*3/uL (ref 4.0–10.5)

## 2015-10-10 LAB — TROPONIN I: Troponin I: 0.17 ng/mL — ABNORMAL HIGH (ref <0.031)

## 2015-10-10 LAB — COMPREHENSIVE METABOLIC PANEL
ALBUMIN: 3.7 g/dL (ref 3.5–5.0)
ALT: 10 U/L — ABNORMAL LOW (ref 14–54)
ANION GAP: 17 — AB (ref 5–15)
AST: 15 U/L (ref 15–41)
Alkaline Phosphatase: 418 U/L — ABNORMAL HIGH (ref 38–126)
BILIRUBIN TOTAL: 1.1 mg/dL (ref 0.3–1.2)
BUN: 90 mg/dL — AB (ref 6–20)
CHLORIDE: 97 mmol/L — AB (ref 101–111)
CO2: 22 mmol/L (ref 22–32)
Calcium: 10.5 mg/dL — ABNORMAL HIGH (ref 8.9–10.3)
Creatinine, Ser: 12.2 mg/dL — ABNORMAL HIGH (ref 0.44–1.00)
GFR calc Af Amer: 4 mL/min — ABNORMAL LOW (ref >60)
GFR calc non Af Amer: 4 mL/min — ABNORMAL LOW (ref >60)
GLUCOSE: 94 mg/dL (ref 65–99)
POTASSIUM: 6 mmol/L — AB (ref 3.5–5.1)
SODIUM: 136 mmol/L (ref 135–145)
TOTAL PROTEIN: 8.2 g/dL — AB (ref 6.5–8.1)

## 2015-10-10 MED ORDER — HYDROMORPHONE HCL 1 MG/ML IJ SOLN
1.0000 mg | Freq: Once | INTRAMUSCULAR | Status: AC
  Administered 2015-10-10: 1 mg via INTRAMUSCULAR
  Filled 2015-10-10: qty 1

## 2015-10-10 MED ORDER — DIPHENHYDRAMINE HCL 25 MG PO CAPS
25.0000 mg | ORAL_CAPSULE | Freq: Once | ORAL | Status: AC
  Administered 2015-10-10: 25 mg via ORAL
  Filled 2015-10-10: qty 1

## 2015-10-10 NOTE — Consult Note (Addendum)
Renal Service Consult Note [facility]  [name] Small 10/10/2015 [name] Small D Requesting Physician:  Dr Jeraldine Loots  Reason for Consult:  ESRD pt needs dialysis HPI: The patient is a 24 y.o. 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 Hx failed transplant, s/p removal. She was on HD in [place], moved here recently but had not been accepted by any HD unit here. She was turned down by [facility] here in [place] (previously was released from the practice) and continues to have no unit in this area for OP dialysis. She is not planning on returning to [place].  Her last HD was inpatient HD here 5 days ago. Comes to ED tonight for dialysis.  K  Is 6.0, creat 12.20, CO2 22.   She couldn't sleep last night because of SOB.  +cough, nonprod, no fevers, chills or sweats. She has been vomiting.     ROS  no CP  no SOB  +n/v   no abd pain  no dysuria  Past Medical History  Past Medical History  Diagnosis Date  . Hemodialysis patient (HCC)   . Hypertension   . Pulmonary emboli (HCC) 01/2012    Bilateral, moderate clot burden, areas of pulmonary infarction and central necrosis  . CHF (congestive heart failure) (HCC)   . Cardiomyopathy   . Dysrhythmia     at times per pt.  . Anemia   . H/O transfusion of packed red blood cells   . 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 Polycystic kidney disease   . Cellulitis and abscess of face 03/22/2013  . Renal insufficiency   . Sickle cell anemia (HCC)   . Chronic pain   . GERD (gastroesophageal reflux disease)   . Depression    Past Surgical History  Past Surgical History  Procedure Laterality Date  . Nephrectomy    . Av fistula placement    . Kidney transplant  2008    failed  . Tonsillectomy      as a child.  . Adenoidectomy    . Incision and drainage abscess Right 03/21/2013    Procedure: INCISION AND DRAINAGE  RIGHT CHEEK ABSCESS REMOVAL OF FOREIGN BODY;  Surgeon: Serena Colonel, MD;  Location: [facility] OR;  Service: ENT;  Laterality: Right;  . Implantable cardioverter defibrillator implant Right 12/2014   Family History  Family History  Problem Relation Age of Onset  . Polycystic kidney disease Father    Social History  reports that she has been smoking Cigarettes.  She has been smoking about 0.20 packs per day. She has never used smokeless tobacco. She reports that she does not drink alcohol or use illicit drugs. Allergies  Allergies  Allergen Reactions  . Tramadol Anaphylaxis  . Iohexol Itching  . Morphine And Related Itching    Ok with oxycodone  . Vicodin [Hydrocodone-Acetaminophen] Hives   Home medications Prior to Admission medications   Medication Sig Start Date End Date Taking? Authorizing Provider  amLODipine (NORVASC) 10 MG tablet Take 1 tablet (10 mg total) by mouth at bedtime. 09/25/15  Yes Zannie Cove, MD  carvedilol (COREG) 6.25 MG tablet Take 1 tablet (6.25 mg total) by mouth 2 (two) times daily. 09/25/15  Yes Zannie Cove, MD  cinacalcet (SENSIPAR) 30 MG tablet Take 1 tablet (30 mg total) by mouth daily with supper. 09/25/15  Yes Zannie Cove, MD  isosorbide dinitrate (ISORDIL) 10  MG tablet Take 1 tablet (10 mg total) by mouth 3 (three) times daily. 09/25/15  Yes Zannie Cove, MD  LORazepam (ATIVAN) 1 MG tablet Take 1 mg by mouth daily. Patient states she takes one tablet by mouth every day per patient   Yes Historical Provider, MD  Oxycodone HCl 10 MG TABS Take 1 tablet (10 mg total) by mouth every 8 (eight) hours as needed. For pain 10/05/15  Yes Dorothea Ogle, MD  sevelamer carbonate (RENVELA) 800 MG tablet Take 3 tablets (2,400 mg total) by mouth 3 (three) times daily with meals. 09/25/15 09/24/16 Yes Zannie Cove, MD   Liver Function Tests  Recent Labs Lab 10/05/15 0520 10/10/15 2039  AST  --  15  ALT  --  10*  ALKPHOS  --  418*  BILITOT  --  1.1  PROT  --  8.2*   ALBUMIN 3.1* 3.7   No results for input(s): LIPASE, AMYLASE in the last 168 hours. CBC  Recent Labs Lab 10/04/15 0348 10/05/15 0520 10/10/15 2039  WBC 5.1 6.8 7.2  NEUTROABS  --   --  4.7  HGB 10.6* 10.3* 11.2*  HCT 34.2* 33.3* 35.0*  MCV 90.7 91.0 90.9  PLT 133* 183 149*   Basic Metabolic Panel  Recent Labs Lab 10/04/15 0616 10/05/15 0520 10/10/15 2039  NA 139 138 136  K 6.3* 3.2* 6.0*  CL 102 96* 97*  CO2 21* 26 22  GLUCOSE 142* 105* 94  BUN 97* 52* 90*  CREATININE 11.47* 7.69* 12.20*  CALCIUM 8.9 8.7* 10.5*  PHOS  --  6.4*  --     Filed Vitals:   10/10/15 2012 10/10/15 2045 10/10/15 2130 10/10/15 2145  BP: 157/116 151/114 150/116 156/122  Pulse: 103 31 117   Temp: 98.4 F (36.9 C)     TempSrc: Oral     Resp: 18 24 18 17   SpO2: 100% 67% 88%    Exam Alert, moderate facial edema +jvd Chest faint basilar rales, o/w clear RRR +S3, no MR Abd soft ntnd no mass or ascites 1+ bilat LE edema Alert no asterixis L arm AVF +bruit   Last HD in [place], NCTTS HD w dry wt ~50kg per pt [facility] orders from May 2016 > HD TTS 3h Hep 2000 LUA AVF    Assessment: 1 Hyperkalemia 2 ESRD is without local HD unit; last HD here 5 d ago 3 Vol overload - symptomatic w S3, SOB 4 HTN amlod/ coreg 5 MBD renvela 6 Anemia Hb 10.7, no esa   Plan - pt to be admitted, plan HD tonight   Vinson Moselle MD [facility] pager [phone]    cell [phone] 10/10/2015, 10:55 PM

## 2015-10-10 NOTE — Procedures (Deleted)
Patient remains w/o an outpatient HD unit.  Presenting to ED today , last HD 5 days ago here when admitted to hospital.  No CP, SOB. +N/V several episodes. Also hx of SCD, ICD in place for CM.  Recently moved here from [place], [place].  Plan HD tonight, will call in nurse.  OK for dc after HD.   Vinson Moselle MD [employer] pager [phone]    cell [phone] 10/10/2015, 10:09 PM

## 2015-10-10 NOTE — ED Notes (Signed)
Pt. reports central chest pain onset 2 days ago with mild SOB , emesis and productive cough . Presents with facial swelling and abdominal swelling , Hemodialysis 5 days ago .

## 2015-10-10 NOTE — ED Provider Notes (Addendum)
CSN: [id number]     Arrival date & time 10/10/15  1955 History   First MD Initiated Contact with Patient 10/10/15 2024     Chief Complaint  Patient presents with  . Chest Pain     (Consider location/radiation/quality/duration/timing/severity/associated sxs/prior Treatment) HPI Patient presents with concern of chest pain, nausea. Patient was discharged from this facility one week ago. She notes that in the interval, she has had no dialysis in 5 days. Over the past 2 days in particular she has developed diffuse chest discomfort, mild dyspnea, ongoing nausea. Several episodes of vomiting have occurred, including one earlier today. No objective fever, no confusion, no syncope, no abdominal pain. He acknowledges a history of multiple medical issues, including end-stage renal disease, sickle cell anemia, congestive heart failure. Patient has a pacemaker in place. No recent shocks. Patient was receiving medical care in [place]. Patient recently moved here.  Past Medical History  Diagnosis Date  . Hemodialysis patient (HCC)   . Hypertension   . Pulmonary emboli (HCC) 01/2012    Bilateral, moderate clot burden, areas of pulmonary infarction and central necrosis  . CHF (congestive heart failure) (HCC)   . Cardiomyopathy   . Dysrhythmia     at times per pt.  . Anemia   . H/O transfusion of packed red blood cells   . 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 Polycystic kidney disease   . Cellulitis and abscess of face 03/22/2013  . Renal insufficiency   . Sickle cell anemia (HCC)   . Chronic pain   . GERD (gastroesophageal reflux disease)   . Depression    Past Surgical History  Procedure Laterality Date  . Nephrectomy    . Av fistula placement    . Kidney transplant  2008    failed  . Tonsillectomy      as a child.  . Adenoidectomy    . Incision and drainage abscess Right  03/21/2013    Procedure: INCISION AND DRAINAGE RIGHT CHEEK ABSCESS REMOVAL OF FOREIGN BODY;  Surgeon: Serena Colonel, MD;  Location: [facility] OR;  Service: ENT;  Laterality: Right;  . Implantable cardioverter defibrillator implant Right 12/2014   Family History  Problem Relation Age of Onset  . Polycystic kidney disease Father    Social History  Substance Use Topics  . Smoking status: Current Every Day Smoker -- 0.20 packs/day    Types: Cigarettes  . Smokeless tobacco: Never Used  . Alcohol Use: No   OB History    No data available     Review of Systems  Constitutional:       Per HPI, otherwise negative  HENT:       Per HPI, otherwise negative  Respiratory:       Per HPI, otherwise negative  Cardiovascular:       Per HPI, otherwise negative  Gastrointestinal: Positive for nausea and vomiting.  Endocrine:       Negative aside from HPI  Genitourinary:       Neg aside from HPI   Musculoskeletal:       Per HPI, otherwise negative  Skin: Negative.   Neurological: Negative for syncope.      Allergies  Tramadol; Iohexol; Morphine and related; and Vicodin  Home Medications   Prior to Admission medications   Medication Sig Start Date End Date Taking? Authorizing Provider  amLODipine (NORVASC) 10 MG tablet Take 1 tablet (10  mg total) by mouth at bedtime. 09/25/15  Yes Zannie Cove, MD  carvedilol (COREG) 6.25 MG tablet Take 1 tablet (6.25 mg total) by mouth 2 (two) times daily. 09/25/15  Yes Zannie Cove, MD  cinacalcet (SENSIPAR) 30 MG tablet Take 1 tablet (30 mg total) by mouth daily with supper. 09/25/15  Yes Zannie Cove, MD  isosorbide dinitrate (ISORDIL) 10 MG tablet Take 1 tablet (10 mg total) by mouth 3 (three) times daily. 09/25/15  Yes Zannie Cove, MD  LORazepam (ATIVAN) 1 MG tablet Take 1 mg by mouth daily. Patient states she takes one tablet by mouth every day per patient   Yes Historical Provider, MD  Oxycodone HCl 10 MG TABS Take 1 tablet (10 mg total) by mouth  every 8 (eight) hours as needed. For pain 10/05/15  Yes Dorothea Ogle, MD  sevelamer carbonate (RENVELA) 800 MG tablet Take 3 tablets (2,400 mg total) by mouth 3 (three) times daily with meals. 09/25/15 09/24/16 Yes Zannie Cove, MD   BP 157/116 mmHg  Pulse 103  Temp(Src) 98.4 F (36.9 C) (Oral)  Resp 18  SpO2 100% Physical Exam  Constitutional: She is oriented to person, place, and time. She appears well-developed and well-nourished. No distress.  HENT:  Head: Normocephalic and atraumatic.  Eyes: Conjunctivae and EOM are normal.  Cardiovascular: Normal rate and regular rhythm.   Pulmonary/Chest: Effort normal and breath sounds normal. No stridor. No respiratory distress.    Abdominal: She exhibits no distension. There is no tenderness.  Musculoskeletal: She exhibits no edema.       Arms: Neurological: She is alert and oriented to person, place, and time. No cranial nerve deficit.  Skin: Skin is warm and dry.  Psychiatric: She has a normal mood and affect.  Nursing note and vitals reviewed.   ED Course  Procedures (including critical care time) Labs Review Labs Reviewed  CBC WITH DIFFERENTIAL/PLATELET - Abnormal; Notable for the following:    RBC 3.85 (*)    Hemoglobin 11.2 (*)    HCT 35.0 (*)    RDW 19.9 (*)    All other components within normal limits  COMPREHENSIVE METABOLIC PANEL  TROPONIN I    Imaging Review No results found. I have personally reviewed and evaluated these images and lab results as part of my medical decision-making.   EKG Interpretation   Date/Time:  Sunday October 10 2015 20:09:44 EST Ventricular Rate:  102 PR Interval:  336 QRS Duration: 84 QT Interval:  430 QTC Calculation: 560 R Axis:   103 Text Interpretation:   Critical Test Result: Long QTc Sinus tachycardia  with 1st degree A-V block Rightward axis Prolonged QT Abnormal ECG Sinus  tachycardia QT prolonged Rightward axis T wave abnormality Abnormal ekg  Confirmed by Gerhard Munch  MD [phone]) on 10/10/2015 8:27:42 PM     EMR notable for recent hospitalization here. During that hospitalization the patient was evaluated by our case management team. Patient was unable to be placed in dialysis locally, given her history of medication noncompliance. Seemingly, the patient was going to follow up in [place], for dialysis. During her evaluation the patient had multiple studies, blood, radiographic, performed. The studies were largely reassuring, and though she needed dialysis in the hospital, no evidence for acute chest, pulmonary embolism.   Update: Patient has mild pruritus following Dilaudid dosing.  9:53 PM Potassium is elevated at 6. With mild T-wave change compared to prior, I discussed her case with our nephrology team. Patient will require emergent dialysis.  MDM  This young female with history of medication noncompliance, and recent hospitalization here now presents with concern for diffuse discomfort, chest pain, dyspnea, nausea, vomiting. Here, the patient is mildly tachycardic, but awake, alert, interactive appropriately. Patient is afebrile, with no x-ray evidence for pulmonary edema. Patient has no evidence for new coronary ischemia, but with new hyperkalemia, and slight T-wave changes, the patient will require dialysis emergently. With recent evaluation at this hospital, there is low suspicion for other acute new findings, including pulmonary embolism, pneumonia Patient was transferred to our dialysis unit.  CRITICAL CARE Performed by: Gerhard Munch Total critical care time: 40 minutes Critical care time was exclusive of separately billable procedures and treating other patients. Critical care was necessary to treat or prevent imminent or life-threatening deterioration. Critical care was time spent personally by me on the following activities: development of treatment plan with patient and/or surrogate as well as nursing, discussions with  consultants, evaluation of patient's response to treatment, examination of patient, obtaining history from patient or surrogate, ordering and performing treatments and interventions, ordering and review of laboratory studies, ordering and review of radiographic studies, pulse oximetry and re-evaluation of patient's condition.   Gerhard Munch, MD 10/10/15 2155  10:21 PM Patient awaiting HD.  Though she has hyperkalemia, she is tolerant of oral intake, and IM medications control her pain.  She does not require IV access prior to dialysis.  Gerhard Munch, MD 10/10/15 2234

## 2015-10-10 NOTE — H&P (Signed)
[facility] History and Physical  [name] WUJ:[id number] DOB: [birth year] DOA: 10/10/2015  Referring physician: Gerhard Munch, M.D. PCP: No PCP Per Patient   Chief Complaint: Chest pain.  HPI: [name] Small is a 24 y.o. female  with a medical history of PKD, ESRD on hemodialysis, kidney transplant in 2008 with failed kidney transplant with nephrectomy in November 2012, hypertension, history of bilateral pulmonary embolism, systolic CHF, anemia, GERD, depression, medical noncompliance who comes to the emergency department due to not having dialysis in the past 5 days with complaints of mild dyspnea, emesis, pleuritic chest pain, productive cough of whitish sputum, (facial, abdominal and lower extremity edema).   Her last dialysis was done as an inpatient at this facility 5 days ago. She used to live in [place], and then moved to [place] and now has returned to [place]. She has not established with hemodialysis unit locally yet. She denies fever, chills, but feels very fatigued. She has had several episodes of emesis, but denies abdominal pain, diarrhea, constipation, melena or hematochezia.  When seen in the emergency department, the patient was in no acute distress. She stated that she felt better after analgesic and supplemental oxygen. Workup reveals hyperkalemia.  Review of Systems:  Constitutional:  Positive fatigue and weight gain secondary to fluid retention.  No night sweats, Fevers, chills,  HEENT:  No headaches, Difficulty swallowing,Tooth/dental problems,Sore throat,  No sneezing, itching, ear ache, nasal congestion, post nasal drip,  Cardio-vascular:  Positive Pleuritic chest pain, Orthopnea, swelling in lower extremities, anasarca,  Denies PND, dizziness, palpitations  GI:  Positive nausea, vomiting,  loss of appetite  No heartburn, indigestion, abdominal pain, diarrhea, change in bowel habits, Resp:  Mild dyspnea, pleuritic chest pain,  productive cough of whitish sputum, no hemoptysis. Skin:  no rash or lesions.  GU:  no dysuria, change in color of urine, no urgency or frequency. No flank pain.  Musculoskeletal:  No joint pain or swelling. No decreased range of motion. No back pain.  Psych:  No change in mood or affect. History of depression and anxiety. No memory loss.   Past Medical History  Diagnosis Date  . Hemodialysis patient (HCC)   . Hypertension   . Pulmonary emboli (HCC) 01/2012    Bilateral, moderate clot burden, areas of pulmonary infarction and central necrosis  . CHF (congestive heart failure) (HCC)   . Cardiomyopathy   . Dysrhythmia     at times per pt.  . Anemia   . H/O transfusion of packed red blood cells   . 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 Polycystic kidney disease   . Cellulitis and abscess of face 03/22/2013  . Renal insufficiency   . Sickle cell anemia (HCC)   . Chronic pain   . GERD (gastroesophageal reflux disease)   . Depression    Past Surgical History  Procedure Laterality Date  . Nephrectomy    . Av fistula placement    . Kidney transplant  2008    failed  . Tonsillectomy      as a child.  . Adenoidectomy    . Incision and drainage abscess Right 03/21/2013    Procedure: INCISION AND DRAINAGE RIGHT CHEEK ABSCESS REMOVAL OF FOREIGN BODY;  Surgeon: Serena Colonel, MD;  Location: [facility] OR;  Service: ENT;  Laterality: Right;  . Implantable cardioverter defibrillator implant Right 12/2014   Social History:  reports that she  has been smoking Cigarettes.  She has been smoking about 0.20 packs per day. She has never used smokeless tobacco. She reports that she does not drink alcohol or use illicit drugs.  Allergies  Allergen Reactions  . Tramadol Anaphylaxis  . Iohexol Itching  . Morphine And Related Itching    Ok with oxycodone  . Vicodin [Hydrocodone-Acetaminophen] Hives     Family History  Problem Relation Age of Onset  . Polycystic kidney disease Father     Prior to Admission medications   Medication Sig Start Date End Date Taking? Authorizing Provider  amLODipine (NORVASC) 10 MG tablet Take 1 tablet (10 mg total) by mouth at bedtime. 09/25/15  Yes Zannie Cove, MD  carvedilol (COREG) 6.25 MG tablet Take 1 tablet (6.25 mg total) by mouth 2 (two) times daily. 09/25/15  Yes Zannie Cove, MD  cinacalcet (SENSIPAR) 30 MG tablet Take 1 tablet (30 mg total) by mouth daily with supper. 09/25/15  Yes Zannie Cove, MD  isosorbide dinitrate (ISORDIL) 10 MG tablet Take 1 tablet (10 mg total) by mouth 3 (three) times daily. 09/25/15  Yes Zannie Cove, MD  LORazepam (ATIVAN) 1 MG tablet Take 1 mg by mouth daily. Patient states she takes one tablet by mouth every day per patient   Yes Historical Provider, MD  Oxycodone HCl 10 MG TABS Take 1 tablet (10 mg total) by mouth every 8 (eight) hours as needed. For pain 10/05/15  Yes Dorothea Ogle, MD  sevelamer carbonate (RENVELA) 800 MG tablet Take 3 tablets (2,400 mg total) by mouth 3 (three) times daily with meals. 09/25/15 09/24/16 Yes Zannie Cove, MD   Physical Exam: Filed Vitals:   10/10/15 2130 10/10/15 2145 10/10/15 2245 10/10/15 2255  BP: 150/116 156/122 164/120 164/120  Pulse: 117  106 108  Temp:    98.6 F (37 C)  TempSrc:    Oral  Resp: SpO2: 88%  92% 100%    Wt Readings from Last 3 Encounters:  10/05/15 51.9 kg (114 lb 6.7 oz)  10/01/15 53 kg (116 lb 13.5 oz)  09/29/15 54.7 kg (120 lb 9.5 oz)    General:  Appears calm and comfortable Eyes: PERRL, normal lids, irises & conjunctiva ENT: grossly normal hearing, lips & tongue Neck: no LAD, masses or thyromegaly Cardiovascular: RRR, no m/r/g. No LE edema. Telemetry: SR, no arrhythmias  Respiratory: CTA bilaterally, no w/r/r. Normal respiratory effort. Abdomen: soft, ntnd Skin: no rash or induration seen on limited  exam Musculoskeletal: grossly normal tone BUE/BLE Psychiatric: grossly normal mood and affect, speech fluent and appropriate Neurologic: grossly non-focal.          Labs on Admission:  Basic Metabolic Panel:  Recent Labs Lab 10/04/15 0616 10/05/15 0520 10/10/15 2039  NA 139 138 136  K 6.3* 3.2* 6.0*  CL 102 96* 97*  CO2 21* 26 22  GLUCOSE 142* 105* 94  BUN 97* 52* 90*  CREATININE 11.47* 7.69* 12.20*  CALCIUM 8.9 8.7* 10.5*  PHOS  --  6.4*  --    Liver Function Tests:  Recent Labs Lab 10/05/15 0520 10/10/15 2039  AST  --  15  ALT  --  10*  ALKPHOS  --  418*  BILITOT  --  1.1  PROT  --  8.2*  ALBUMIN 3.1* 3.7   CBC:  Recent Labs Lab 10/04/15 0348 10/05/15 0520 10/10/15 2039  WBC 5.1 6.8 7.2  NEUTROABS  --   --  4.7  HGB 10.6* 10.3*  11.2*  HCT 34.2* 33.3* 35.0*  MCV 90.7 91.0 90.9  PLT 133* 183 149*   Cardiac Enzymes:  Recent Labs Lab 10/04/15 0348 10/10/15 2039  TROPONINI 0.11* 0.17*    BNP (last 3 results)  Recent Labs  06/29/15 1615 09/29/15 1242  BNP >4500.0* 2492.9*     Radiological Exams on Admission: Dg Chest 2 View  10/10/2015  CLINICAL DATA:  Chest pain for 2 days. EXAM: CHEST  2 VIEW COMPARISON:  None. FINDINGS: There is a right chest wall AICD with lead in the right ventricle. Heart size is enlarged. No pleural effusion or edema identified. No airspace consolidation noted. IMPRESSION: 1. Cardiac enlargement. 2. No acute findings. Electronically Signed   By: Signa Kell M.D.   On: 10/10/2015 21:10    EKG: Independently reviewed. Vent. rate 102 BPM PR interval 336 ms QRS duration 84 ms QT/QTc 430/560 ms P-R-T axes 65 103 54 Sinus tachycardia with 1st degree A-V block Rightward axis Prolonged QT Abnormal ECG  Assessment/Plan Principal Problem:   Fluid overload   Hyperkalemia Admit to telemetry. Hemodialysis as per nephrology orders. Follow-up potassium level after hemodialysis.  Active Problems:   ESRD (end stage  renal disease) on dialysis [facility])  Hemodialysis as per nephrology orders.    Systolic CHF, chronic (HCC)   Chronically Elevated troponin Continue current therapy. Trend troponin levels due to acute symptoms.    Anemia of chronic renal failure, stage 5 (HCC) Monitor H&H. Erythropoietin as per nephrology.    Hypertension Continue current antihypertensive therapy and monitor blood pressure.   Nephrology has consulted on the case.   Code Status: Full code. DVT Prophylaxis: Lovenox.  Family Communication:  Disposition Plan: Admit to telemetry for hemodialysis.  Time spent: Over 70 minutes were spent in the process of this admission.  Bobette Mo [facility] Pager [phone].

## 2015-10-11 ENCOUNTER — Other Ambulatory Visit: Payer: Self-pay

## 2015-10-11 ENCOUNTER — Encounter (HOSPITAL_COMMUNITY): Payer: Self-pay | Admitting: General Practice

## 2015-10-11 DIAGNOSIS — I5022 Chronic systolic (congestive) heart failure: Secondary | ICD-10-CM | POA: Diagnosis present

## 2015-10-11 DIAGNOSIS — R7989 Other specified abnormal findings of blood chemistry: Secondary | ICD-10-CM

## 2015-10-11 DIAGNOSIS — N186 End stage renal disease: Principal | ICD-10-CM

## 2015-10-11 DIAGNOSIS — D631 Anemia in chronic kidney disease: Secondary | ICD-10-CM

## 2015-10-11 DIAGNOSIS — E875 Hyperkalemia: Secondary | ICD-10-CM | POA: Diagnosis present

## 2015-10-11 DIAGNOSIS — I132 Hypertensive heart and chronic kidney disease with heart failure and with stage 5 chronic kidney disease, or end stage renal disease: Secondary | ICD-10-CM | POA: Diagnosis present

## 2015-10-11 DIAGNOSIS — I429 Cardiomyopathy, unspecified: Secondary | ICD-10-CM | POA: Diagnosis present

## 2015-10-11 DIAGNOSIS — D571 Sickle-cell disease without crisis: Secondary | ICD-10-CM | POA: Diagnosis present

## 2015-10-11 DIAGNOSIS — Z9115 Patient's noncompliance with renal dialysis: Secondary | ICD-10-CM | POA: Diagnosis not present

## 2015-10-11 DIAGNOSIS — Z95810 Presence of automatic (implantable) cardiac defibrillator: Secondary | ICD-10-CM | POA: Diagnosis not present

## 2015-10-11 DIAGNOSIS — Z992 Dependence on renal dialysis: Secondary | ICD-10-CM | POA: Diagnosis not present

## 2015-10-11 DIAGNOSIS — Q613 Polycystic kidney, unspecified: Secondary | ICD-10-CM | POA: Diagnosis not present

## 2015-10-11 DIAGNOSIS — F17210 Nicotine dependence, cigarettes, uncomplicated: Secondary | ICD-10-CM | POA: Diagnosis present

## 2015-10-11 DIAGNOSIS — N185 Chronic kidney disease, stage 5: Secondary | ICD-10-CM

## 2015-10-11 DIAGNOSIS — R079 Chest pain, unspecified: Secondary | ICD-10-CM | POA: Diagnosis present

## 2015-10-11 DIAGNOSIS — Z940 Kidney transplant status: Secondary | ICD-10-CM | POA: Diagnosis not present

## 2015-10-11 DIAGNOSIS — Z79899 Other long term (current) drug therapy: Secondary | ICD-10-CM | POA: Diagnosis not present

## 2015-10-11 DIAGNOSIS — G894 Chronic pain syndrome: Secondary | ICD-10-CM | POA: Diagnosis present

## 2015-10-11 DIAGNOSIS — R0600 Dyspnea, unspecified: Secondary | ICD-10-CM | POA: Diagnosis present

## 2015-10-11 DIAGNOSIS — K219 Gastro-esophageal reflux disease without esophagitis: Secondary | ICD-10-CM | POA: Diagnosis present

## 2015-10-11 DIAGNOSIS — E8779 Other fluid overload: Secondary | ICD-10-CM | POA: Diagnosis not present

## 2015-10-11 DIAGNOSIS — N2581 Secondary hyperparathyroidism of renal origin: Secondary | ICD-10-CM | POA: Diagnosis present

## 2015-10-11 DIAGNOSIS — F329 Major depressive disorder, single episode, unspecified: Secondary | ICD-10-CM | POA: Diagnosis present

## 2015-10-11 DIAGNOSIS — Z86711 Personal history of pulmonary embolism: Secondary | ICD-10-CM | POA: Diagnosis not present

## 2015-10-11 LAB — CBC
HEMATOCRIT: 34.2 % — AB (ref 36.0–46.0)
Hemoglobin: 11 g/dL — ABNORMAL LOW (ref 12.0–15.0)
MCH: 29 pg (ref 26.0–34.0)
MCHC: 32.2 g/dL (ref 30.0–36.0)
MCV: 90.2 fL (ref 78.0–100.0)
PLATELETS: 137 10*3/uL — AB (ref 150–400)
RBC: 3.79 MIL/uL — AB (ref 3.87–5.11)
RDW: 19.8 % — ABNORMAL HIGH (ref 11.5–15.5)
WBC: 5.6 10*3/uL (ref 4.0–10.5)

## 2015-10-11 LAB — BASIC METABOLIC PANEL
Anion gap: 14 (ref 5–15)
BUN: 24 mg/dL — ABNORMAL HIGH (ref 6–20)
CHLORIDE: 96 mmol/L — AB (ref 101–111)
CO2: 26 mmol/L (ref 22–32)
CREATININE: 4.9 mg/dL — AB (ref 0.44–1.00)
Calcium: 10.4 mg/dL — ABNORMAL HIGH (ref 8.9–10.3)
GFR calc non Af Amer: 11 mL/min — ABNORMAL LOW (ref >60)
GFR, EST AFRICAN AMERICAN: 13 mL/min — AB (ref >60)
Glucose, Bld: 82 mg/dL (ref 65–99)
POTASSIUM: 3.6 mmol/L (ref 3.5–5.1)
SODIUM: 136 mmol/L (ref 135–145)

## 2015-10-11 LAB — TROPONIN I
TROPONIN I: 0.15 ng/mL — AB (ref <0.031)
Troponin I: 0.21 ng/mL — ABNORMAL HIGH (ref <0.031)

## 2015-10-11 MED ORDER — HYDROXYZINE HCL 25 MG PO TABS
ORAL_TABLET | ORAL | Status: AC
  Administered 2015-10-11: 25 mg via ORAL
  Filled 2015-10-11: qty 1

## 2015-10-11 MED ORDER — LIDOCAINE HCL (PF) 1 % IJ SOLN: 5.0000 mL | INTRAMUSCULAR | Status: DC | PRN

## 2015-10-11 MED ORDER — HYDROMORPHONE HCL 1 MG/ML IJ SOLN
1.0000 mg | INTRAMUSCULAR | Status: DC | PRN
  Administered 2015-10-11 – 2015-10-12 (×7): 1 mg via INTRAVENOUS
  Filled 2015-10-11 (×6): qty 1

## 2015-10-11 MED ORDER — SODIUM CHLORIDE 0.9 % IV SOLN: 250.0000 mL | INTRAVENOUS | Status: DC | PRN

## 2015-10-11 MED ORDER — LORAZEPAM 0.5 MG PO TABS
ORAL_TABLET | ORAL | Status: AC
  Administered 2015-10-11: 1 mg via ORAL
  Filled 2015-10-11: qty 2

## 2015-10-11 MED ORDER — SEVELAMER CARBONATE 800 MG PO TABS
2400.0000 mg | ORAL_TABLET | Freq: Three times a day (TID) | ORAL | Status: DC
Start: 2015-10-11 — End: 2015-10-12
  Administered 2015-10-11 (×3): 2400 mg via ORAL
  Filled 2015-10-11: qty 3

## 2015-10-11 MED ORDER — SODIUM CHLORIDE 0.9 % IJ SOLN
3.0000 mL | Freq: Two times a day (BID) | INTRAMUSCULAR | Status: DC
Start: 2015-10-11 — End: 2015-10-12
  Administered 2015-10-11: 3 mL via INTRAVENOUS

## 2015-10-11 MED ORDER — ONDANSETRON HCL 4 MG/2ML IJ SOLN: 4.0000 mg | Freq: Four times a day (QID) | INTRAMUSCULAR | Status: DC | PRN

## 2015-10-11 MED ORDER — CAMPHOR-MENTHOL 0.5-0.5 % EX LOTN
TOPICAL_LOTION | CUTANEOUS | Status: DC | PRN
  Administered 2015-10-12: 01:00:00 via TOPICAL
  Filled 2015-10-11: qty 222

## 2015-10-11 MED ORDER — ALTEPLASE 2 MG IJ SOLR
2.0000 mg | Freq: Once | INTRAMUSCULAR | Status: DC | PRN
  Filled 2015-10-11: qty 2

## 2015-10-11 MED ORDER — LORAZEPAM 1 MG PO TABS
1.0000 mg | ORAL_TABLET | Freq: Every evening | ORAL | Status: DC | PRN
  Administered 2015-10-11 (×2): 1 mg via ORAL
  Filled 2015-10-11: qty 1

## 2015-10-11 MED ORDER — ISOSORBIDE DINITRATE 10 MG PO TABS
10.0000 mg | ORAL_TABLET | Freq: Three times a day (TID) | ORAL | Status: DC
  Administered 2015-10-11 – 2015-10-12 (×4): 10 mg via ORAL
  Filled 2015-10-11 (×4): qty 1

## 2015-10-11 MED ORDER — HYDROMORPHONE HCL 1 MG/ML IJ SOLN
0.5000 mg | Freq: Once | INTRAMUSCULAR | Status: AC
  Administered 2015-10-11: 0.5 mg via INTRAVENOUS
  Filled 2015-10-11: qty 1

## 2015-10-11 MED ORDER — AMLODIPINE BESYLATE 10 MG PO TABS
10.0000 mg | ORAL_TABLET | Freq: Every day | ORAL | Status: DC
  Administered 2015-10-11: 10 mg via ORAL
  Filled 2015-10-11: qty 1

## 2015-10-11 MED ORDER — HYDROXYZINE HCL 25 MG PO TABS
25.0000 mg | ORAL_TABLET | Freq: Once | ORAL | Status: AC | PRN
  Administered 2015-10-11: 25 mg via ORAL

## 2015-10-11 MED ORDER — SODIUM CHLORIDE 0.9 % IJ SOLN
3.0000 mL | Freq: Two times a day (BID) | INTRAMUSCULAR | Status: DC
  Administered 2015-10-11: 3 mL via INTRAVENOUS

## 2015-10-11 MED ORDER — HEPARIN SODIUM (PORCINE) 1000 UNIT/ML DIALYSIS
2000.0000 [IU] | Freq: Once | INTRAMUSCULAR | Status: DC
  Filled 2015-10-11: qty 2

## 2015-10-11 MED ORDER — LIDOCAINE-PRILOCAINE 2.5-2.5 % EX CREA: 1.0000 "application " | TOPICAL_CREAM | CUTANEOUS | Status: DC | PRN

## 2015-10-11 MED ORDER — ENOXAPARIN SODIUM 30 MG/0.3ML ~~LOC~~ SOLN: 30.0000 mg | SUBCUTANEOUS | Status: DC

## 2015-10-11 MED ORDER — HEPARIN SODIUM (PORCINE) 1000 UNIT/ML DIALYSIS
1000.0000 [IU] | INTRAMUSCULAR | Status: DC | PRN
  Filled 2015-10-11: qty 1

## 2015-10-11 MED ORDER — PENTAFLUOROPROP-TETRAFLUOROETH EX AERO: 1.0000 "application " | INHALATION_SPRAY | CUTANEOUS | Status: DC | PRN

## 2015-10-11 MED ORDER — HYDROMORPHONE HCL 1 MG/ML IJ SOLN
1.0000 mg | Freq: Once | INTRAMUSCULAR | Status: AC
  Administered 2015-10-11: 1 mg via INTRAVENOUS
  Filled 2015-10-11: qty 1

## 2015-10-11 MED ORDER — SODIUM CHLORIDE 0.9 % IV SOLN: 100.0000 mL | INTRAVENOUS | Status: DC | PRN

## 2015-10-11 MED ORDER — ONDANSETRON HCL 4 MG PO TABS: 4.0000 mg | ORAL_TABLET | Freq: Four times a day (QID) | ORAL | Status: DC | PRN

## 2015-10-11 MED ORDER — OXYCODONE HCL 5 MG PO TABS
10.0000 mg | ORAL_TABLET | Freq: Three times a day (TID) | ORAL | Status: DC | PRN
  Administered 2015-10-11 (×3): 10 mg via ORAL
  Filled 2015-10-11 (×2): qty 2

## 2015-10-11 MED ORDER — RENA-VITE PO TABS
1.0000 | ORAL_TABLET | Freq: Every day | ORAL | Status: DC
  Administered 2015-10-11: 1 via ORAL
  Filled 2015-10-11: qty 1

## 2015-10-11 MED ORDER — CINACALCET HCL 30 MG PO TABS
30.0000 mg | ORAL_TABLET | Freq: Every day | ORAL | Status: DC
  Administered 2015-10-11: 30 mg via ORAL
  Filled 2015-10-11: qty 1

## 2015-10-11 MED ORDER — OXYCODONE HCL 5 MG PO TABS
ORAL_TABLET | ORAL | Status: AC
  Administered 2015-10-11: 10 mg via ORAL
  Filled 2015-10-11: qty 2

## 2015-10-11 MED ORDER — CARVEDILOL 6.25 MG PO TABS
6.2500 mg | ORAL_TABLET | Freq: Two times a day (BID) | ORAL | Status: DC
  Administered 2015-10-11 – 2015-10-12 (×3): 6.25 mg via ORAL
  Filled 2015-10-11 (×3): qty 1

## 2015-10-11 MED ORDER — SODIUM CHLORIDE 0.9 % IJ SOLN: 3.0000 mL | INTRAMUSCULAR | Status: DC | PRN

## 2015-10-11 NOTE — Progress Notes (Signed)
[facility]                                                                              Patient Demographics  [name] Small, is a 24 y.o. female, DOB - 05-Apr-1991, XOV:[id number]  Admit date - 10/10/2015   Admitting Physician Bobette Mo, MD  Outpatient Primary MD for the patient is No PCP Per Patient  LOS -    Chief Complaint  Patient presents with  . Chest Pain       Brief HPI   [name] Small is a 24 y.o. 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  Her last dialysis was done as an inpatient at this facility 5 days ago. She used to live in [place], and then moved to [place], now has returned to [place]. She has not established with hemodialysis unit locally yet. ED Workup revealed hyperkalemia.   Assessment & Plan    Principal Problem:   Fluid overload, anasarca with hyperkalemia in the setting of ESRD on hemodialysis, missed dialysis - Nephrology consulted, patient underwent dialysis last night - Management per nephrology, will need to be established with outpatient dialysis center as patient is not planning to return back to [facility]  Active Problems:   ESRD (end stage renal disease) on dialysis [facility]) - Management per nephrology, as per #1    Systolic CHF, chronic (HCC), chronically elevated troponin - Volume management with hemodialysis - 2-D echo in June 2016 had shown EF of 20-25% with severely reduced systolic function, diffuse hypokinesis - Continue Coreg, amlodipine, isosorbide, pain  control - Complaining of pleuritic chest pain (reports that it gets better with Dilaudid, states oxycodone does not help much, patient asking for Dilaudid with the name as it "knocks her out"). No further escalation of pain medications.    Anemia of chronic renal failure, stage 5 (HCC) - H&H at baseline, follow closely    Hypertension - Currently stable, continue amlodipine, Coreg,isosordil   Code Status: full code  Family Communication: Discussed in detail with the patient, all imaging results, lab results explained to the patient     Disposition Plan: Unclear, patient will need to be established with outpatient dialysis center, will defer to nephrology  Time Spent in minutes  25 minutes  Procedures  HD  Consults   Renal   DVT Prophylaxis   SCD's  Medications  Scheduled Meds: . amLODipine  10 mg Oral QHS  . carvedilol  6.25 mg Oral BID WC  . cinacalcet  30 mg Oral Q supper  . isosorbide dinitrate  10 mg Oral TID  . sevelamer carbonate  2,400 mg Oral TID WC  . sodium chloride  3 mL Intravenous Q12H  . sodium chloride  3 mL Intravenous Q12H   Continuous Infusions:  PRN Meds:.sodium chloride,  sodium chloride, sodium chloride, alteplase, heparin, HYDROmorphone (DILAUDID) injection, lidocaine (PF), lidocaine-prilocaine, LORazepam, ondansetron **OR** ondansetron (ZOFRAN) IV, oxyCODONE, pentafluoroprop-tetrafluoroeth, sodium chloride   Antibiotics   Anti-infectives    None        Subjective:   [name] was seen and examined today. Complaining of pleuritic chest pain, pain everywhere, asking for Dilaudid. Denies dizziness, shortness of breath, abdominal pain, N/V/D/C, new weakness, numbess, tingling. No acute events overnight.    Objective:   Blood pressure 140/100, pulse 52, temperature 98.4 F (36.9 C), temperature source Oral, resp. rate 16, weight 54.3 kg (119 lb 11.4 oz), SpO2 100 %.  Wt Readings from Last 3 Encounters:  10/11/15 54.3 kg (119 lb 11.4  oz)  10/05/15 51.9 kg (114 lb 6.7 oz)  10/01/15 53 kg (116 lb 13.5 oz)     Intake/Output Summary (Last 24 hours) at 10/11/15 1156 Last data filed at 10/11/15 1106  Gross per 24 hour  Intake    480 ml  Output   4000 ml  Net  -3520 ml    Exam  General: Alert and oriented x 3, NAD, appears comfortable, anasarca with moderate facial edema  HEENT:  PERRLA, EOMI, Anicteric Sclera, mucous membranes moist.   Neck: Supple,+JVD, no masses  CVS: S1 S2 auscultated, no rubs, murmurs or gallops. Regular rate and rhythm.  Respiratory: Decreased breath sounds at the bases  Abdomen: Soft, nontender, nondistended, + bowel sounds  Ext: no cyanosis clubbing, 1+ edema  Neuro: AAOx3, Cr N's II- XII. Strength 5/5 upper and lower extremities bilaterally  Skin: No rashes  Psych: Normal affect and demeanor, alert and oriented x3    Data Review   Micro Results No results found for this or any previous visit (from the past 240 hour(s)).  Radiology Reports Dg Chest 2 View  10/10/2015  CLINICAL DATA:  Chest pain for 2 days. EXAM: CHEST  2 VIEW COMPARISON:  None. FINDINGS: There is a right chest wall AICD with lead in the right ventricle. Heart size is enlarged. No pleural effusion or edema identified. No airspace consolidation noted. IMPRESSION: 1. Cardiac enlargement. 2. No acute findings. Electronically Signed   By: Signa Kell M.D.   On: 10/10/2015 21:10   Dg Chest 2 View  10/03/2015  CLINICAL DATA:  Central chest pain for 2 days EXAM: CHEST - 2 VIEW COMPARISON:  10/01/2015 FINDINGS: Cardiomegaly is again noted. Defibrillator is again seen and stable. The lungs are clear bilaterally. No acute bony abnormality is noted. IMPRESSION: No change from the recent exam. Electronically Signed   By: Alcide Clever M.D.   On: 10/03/2015 17:14   Dg Chest 2 View  10/01/2015  CLINICAL DATA:  Right upper chest pain for 3 days EXAM: CHEST  2 VIEW COMPARISON:  09/29/2015 FINDINGS: Cardiomegaly again noted.  Single lead cardiac pacemaker is unchanged in position. No acute infiltrate or pleural effusion. No pulmonary edema. Bony thorax is unremarkable IMPRESSION: No active cardiopulmonary disease.  Cardiomegaly again noted. Electronically Signed   By: Natasha Mead M.D.   On: 10/01/2015 21:31   Dg Chest 2 View  09/29/2015  CLINICAL DATA:  Chest tightness EXAM: CHEST  2 VIEW COMPARISON:  09/20/2015 FINDINGS: Chronic cardiomegaly. There is a single chamber pacer/ ICD to the right ventricle. There is no edema, consolidation, effusion, or pneumothorax. Symmetric nipple shadows noted. IVC filter noted. IMPRESSION: Chronic cardiomegaly without failure or other acute finding. Electronically Signed   By: Marnee Spring M.D.   On: 09/29/2015 12:28  Ct Angio Chest Pe W/cm &/or Wo Cm  10/03/2015  CLINICAL DATA:  Acute chest pain. EXAM: CT ANGIOGRAPHY CHEST WITH CONTRAST TECHNIQUE: Multidetector CT imaging of the chest was performed using the standard protocol during bolus administration of intravenous contrast. Multiplanar CT image reconstructions and MIPs were obtained to evaluate the vascular anatomy. CONTRAST:  80mL OMNIPAQUE IOHEXOL 350 MG/ML SOLN COMPARISON:  Chest radiograph of same day. FINDINGS: Mild cardiomegaly is noted. No pneumothorax or pleural effusion is noted. Minimal left posterior basilar subsegmental atelectasis is noted. Right lung is clear. There is no evidence of thoracic aortic dissection or aneurysm. Right-sided pacemaker is noted. No mediastinal mass or adenopathy is noted. Hepatomegaly is noted in the visualized portion of the upper abdomen. Mild pericardial effusion is noted. Some degree of ascites is noted. Review of the MIP images confirms the above findings. IMPRESSION: Mild cardiomegaly with mild pericardial effusion. No evidence of pulmonary embolus. Minimal left posterior basilar subsegmental atelectasis is noted. Hepatomegaly is noted with probable ascites. Electronically Signed   By: Lupita Raider, M.D.   On: 10/03/2015 21:42   Dg Chest Port 1 View  09/20/2015  CLINICAL DATA:  Midchest pain since early this morning. EXAM: PORTABLE CHEST 1 VIEW COMPARISON:  06/29/2015 FINDINGS: There is marked cardiomegaly, unchanged. There are intact appearances of the transvenous cardiac lead. The lungs are clear except for minor unchanged curvilinear scarring in the bases. No large effusions. No pneumothorax. IMPRESSION: Stable cardiomegaly.  No acute cardiopulmonary findings. Electronically Signed   By: Ellery Plunk M.D.   On: 09/20/2015 21:44    CBC  Recent Labs Lab 10/05/15 0520 10/10/15 2039 10/11/15 0648  WBC 6.8 7.2 5.6  HGB 10.3* 11.2* 11.0*  HCT 33.3* 35.0* 34.2*  PLT 183 149* 137*  MCV 91.0 90.9 90.2  MCH 28.1 29.1 29.0  MCHC 30.9 32.0 32.2  RDW 20.1* 19.9* 19.8*  LYMPHSABS  --  1.9  --   MONOABS  --  0.5  --   EOSABS  --  0.1  --   BASOSABS  --  0.0  --     Chemistries   Recent Labs Lab 10/05/15 0520 10/10/15 2039 10/11/15 0648  NA 138 136 136  K 3.2* 6.0* 3.6  CL 96* 97* 96*  CO2 GLUCOSE 105* 94 82  BUN 52* 90* 24*  CREATININE 7.69* 12.20* 4.90*  CALCIUM 8.7* 10.5* 10.4*  AST  --  15  --   ALT  --  10*  --   ALKPHOS  --  418*  --   BILITOT  --  1.1  --    ------------------------------------------------------------------------------------------------------------------ estimated creatinine clearance is 14.6 mL/min (by C-G formula based on Cr of 4.9). ------------------------------------------------------------------------------------------------------------------ No results for input(s): HGBA1C in the last 72 hours. ------------------------------------------------------------------------------------------------------------------ No results for input(s): CHOL, HDL, LDLCALC, TRIG, CHOLHDL, LDLDIRECT in the last 72 hours. ------------------------------------------------------------------------------------------------------------------ No  results for input(s): TSH, T4TOTAL, T3FREE, THYROIDAB in the last 72 hours.  Invalid input(s): FREET3 ------------------------------------------------------------------------------------------------------------------ No results for input(s): VITAMINB12, FOLATE, FERRITIN, TIBC, IRON, RETICCTPCT in the last 72 hours.  Coagulation profile No results for input(s): INR, PROTIME in the last 168 hours.  No results for input(s): DDIMER in the last 72 hours.  Cardiac Enzymes  Recent Labs Lab 10/10/15 2039 10/11/15 0648  TROPONINI 0.17* 0.15*   ------------------------------------------------------------------------------------------------------------------ Invalid input(s): POCBNP  No results for input(s): GLUCAP in the last 72 hours.   Tachina Spoonemore M.D. [facility] 10/11/2015, 11:56 AM  Pager: [phone] Between  7am to 7pm - call Pager - [phone]  After 7pm go to [email or web address] - password [email or web address]  Call night coverage person covering after 7pm

## 2015-10-11 NOTE — Progress Notes (Signed)
Subjective: Interval History: has no complaint .  Objective: Vital signs in last 24 hours: Temp:  [97.7 F (36.5 C)-98.6 F (37 C)] 98.4 F (36.9 C) (12/19 0815) Pulse Rate:  [31-117] 52 (12/19 0815) Resp:  [14-25] 16 (12/19 0815) BP: (121-172)/(70-122) 140/100 mmHg (12/19 0815) SpO2:  [67 %-100 %] 100 % (12/19 0815) Weight:  [54.3 kg (119 lb 11.4 oz)-58.6 kg (129 lb 3 oz)] 54.3 kg (119 lb 11.4 oz) (12/19 0552) Weight change:   Intake/Output from previous day: 12/18 0701 - 12/19 0700 In: 0  Out: 4000  Intake/Output this shift: Total I/O In: 720 [P.O.:720] Out: 0   General appearance: alert, cooperative and no distress Resp: diminished breath sounds bilaterally and rales bibasilar Chest wall: ICD R [place] Cardio: S1, S2 normal, systolic murmur: holosystolic 3/6, blowing at apex and LV lift GI: soft, liver down 5 cm and firm, pos bs Extremities: AVF LUA, B&T  Lab Results:  Recent Labs  10/10/15 2039 10/11/15 0648  WBC 7.2 5.6  HGB 11.2* 11.0*  HCT 35.0* 34.2*  PLT 149* 137*   BMET:  Recent Labs  10/10/15 2039 10/11/15 0648  NA 136 136  K 6.0* 3.6  CL 97* 96*  CO2 22 26  GLUCOSE 94 82  BUN 90* 24*  CREATININE 12.20* 4.90*  CALCIUM 10.5* 10.4*   No results for input(s): PTH in the last 72 hours. Iron Studies: No results for input(s): IRON, TIBC, TRANSFERRIN, FERRITIN in the last 72 hours.  Studies/Results: Dg Chest 2 View  10/10/2015  CLINICAL DATA:  Chest pain for 2 days. EXAM: CHEST  2 VIEW COMPARISON:  None. FINDINGS: There is a right chest wall AICD with lead in the right ventricle. Heart size is enlarged. No pleural effusion or edema identified. No airspace consolidation noted. IMPRESSION: 1. Cardiac enlargement. 2. No acute findings. Electronically Signed   By: Signa Kell M.D.   On: 10/10/2015 21:10    I have reviewed the patient's current medications.  Assessment/Plan: 1 ESRD will do HD in am.  Needs to seek HD opportunities in [facility],  discussed. 2 HTN lower vol 3 Anemia not bad 4 HPTH ?? 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      Frankie Zito L 10/11/2015,4:33 PM

## 2015-10-11 NOTE — Progress Notes (Signed)
Patient refusing SCDs, Doctor aware.  Patient says she is walking in the room and doesn't need any.

## 2015-10-12 ENCOUNTER — Telehealth: Payer: Self-pay

## 2015-10-12 LAB — CBC
HCT: 34.3 % — ABNORMAL LOW (ref 36.0–46.0)
HEMATOCRIT: 31.8 % — AB (ref 36.0–46.0)
HEMOGLOBIN: 10.1 g/dL — AB (ref 12.0–15.0)
HEMOGLOBIN: 10.7 g/dL — AB (ref 12.0–15.0)
MCH: 29.2 pg (ref 26.0–34.0)
MCH: 28.8 pg (ref 26.0–34.0)
MCHC: 31.8 g/dL (ref 30.0–36.0)
MCHC: 31.2 g/dL (ref 30.0–36.0)
MCV: 91.9 fL (ref 78.0–100.0)
MCV: 92.5 fL (ref 78.0–100.0)
Platelets: 162 10*3/uL (ref 150–400)
Platelets: 146 10*3/uL — ABNORMAL LOW (ref 150–400)
RBC: 3.46 MIL/uL — ABNORMAL LOW (ref 3.87–5.11)
RBC: 3.71 MIL/uL — AB (ref 3.87–5.11)
RDW: 19.6 % — ABNORMAL HIGH (ref 11.5–15.5)
RDW: 19.6 % — ABNORMAL HIGH (ref 11.5–15.5)
WBC: 5.5 10*3/uL (ref 4.0–10.5)
WBC: 5.5 10*3/uL (ref 4.0–10.5)

## 2015-10-12 LAB — RENAL FUNCTION PANEL
ALBUMIN: 3.1 g/dL — AB (ref 3.5–5.0)
ANION GAP: 17 — AB (ref 5–15)
BUN: 43 mg/dL — ABNORMAL HIGH (ref 6–20)
CALCIUM: 9.4 mg/dL (ref 8.9–10.3)
CO2: 24 mmol/L (ref 22–32)
Chloride: 97 mmol/L — ABNORMAL LOW (ref 101–111)
Creatinine, Ser: 7.92 mg/dL — ABNORMAL HIGH (ref 0.44–1.00)
GFR, EST AFRICAN AMERICAN: 7 mL/min — AB (ref >60)
GFR, EST NON AFRICAN AMERICAN: 6 mL/min — AB (ref >60)
Glucose, Bld: 135 mg/dL — ABNORMAL HIGH (ref 65–99)
PHOSPHORUS: 6.9 mg/dL — AB (ref 2.5–4.6)
POTASSIUM: 4.5 mmol/L (ref 3.5–5.1)
SODIUM: 138 mmol/L (ref 135–145)

## 2015-10-12 LAB — BASIC METABOLIC PANEL
Anion gap: 13 (ref 5–15)
BUN: 13 mg/dL (ref 6–20)
CHLORIDE: 98 mmol/L — AB (ref 101–111)
CO2: 28 mmol/L (ref 22–32)
CREATININE: 3.64 mg/dL — AB (ref 0.44–1.00)
Calcium: 10.1 mg/dL (ref 8.9–10.3)
GFR calc Af Amer: 19 mL/min — ABNORMAL LOW (ref >60)
GFR calc non Af Amer: 16 mL/min — ABNORMAL LOW (ref >60)
Glucose, Bld: 95 mg/dL (ref 65–99)
Potassium: 4 mmol/L (ref 3.5–5.1)
Sodium: 139 mmol/L (ref 135–145)

## 2015-10-12 MED ORDER — PENTAFLUOROPROP-TETRAFLUOROETH EX AERO: 1.0000 "application " | INHALATION_SPRAY | CUTANEOUS | Status: DC | PRN

## 2015-10-12 MED ORDER — AMLODIPINE BESYLATE 5 MG PO TABS: 2.5000 mg | ORAL_TABLET | Freq: Every day | ORAL | Status: DC

## 2015-10-12 MED ORDER — LORAZEPAM 1 MG PO TABS: 1.0000 mg | ORAL_TABLET | Freq: Every day | ORAL | Status: DC | PRN

## 2015-10-12 MED ORDER — HEPARIN SODIUM (PORCINE) 1000 UNIT/ML DIALYSIS
100.0000 [IU]/kg | INTRAMUSCULAR | Status: DC | PRN
  Filled 2015-10-12: qty 6

## 2015-10-12 MED ORDER — ALTEPLASE 2 MG IJ SOLR: 2.0000 mg | Freq: Once | INTRAMUSCULAR | Status: DC | PRN

## 2015-10-12 MED ORDER — SODIUM CHLORIDE 0.9 % IV SOLN: 100.0000 mL | INTRAVENOUS | Status: DC | PRN

## 2015-10-12 MED ORDER — HEPARIN SODIUM (PORCINE) 1000 UNIT/ML DIALYSIS: 1000.0000 [IU] | INTRAMUSCULAR | Status: DC | PRN

## 2015-10-12 MED ORDER — LIDOCAINE HCL (PF) 1 % IJ SOLN: 5.0000 mL | INTRAMUSCULAR | Status: DC | PRN

## 2015-10-12 MED ORDER — OXYCODONE HCL 10 MG PO TABS: 10.0000 mg | ORAL_TABLET | Freq: Three times a day (TID) | ORAL | Status: DC | PRN

## 2015-10-12 MED ORDER — AMLODIPINE BESYLATE 2.5 MG PO TABS: 2.5000 mg | ORAL_TABLET | Freq: Every day | ORAL | Status: DC

## 2015-10-12 MED ORDER — LIDOCAINE-PRILOCAINE 2.5-2.5 % EX CREA: 1.0000 "application " | TOPICAL_CREAM | CUTANEOUS | Status: DC | PRN

## 2015-10-12 MED ORDER — HYDROMORPHONE HCL 1 MG/ML IJ SOLN
INTRAMUSCULAR | Status: AC
  Filled 2015-10-12: qty 1

## 2015-10-12 NOTE — Progress Notes (Signed)
Attempted to pick up pt for dialysis. Refused until after breakfast. Will try back.

## 2015-10-12 NOTE — Progress Notes (Signed)
Subjective: Interval History: has no complaint .  Objective: Vital signs in last 24 hours: Temp:  [98.2 F (36.8 C)-98.9 F (37.2 C)] 98.3 F (36.8 C) (12/20 0815) Pulse Rate:  [92-110] 92 (12/20 0815) Resp:  [16-18] 18 (12/20 0815) BP: (111-135)/(78-88) 121/86 mmHg (12/20 0815) SpO2:  [100 %] 100 % (12/20 0815) Weight:  [54 kg (119 lb 0.8 oz)] 54 kg (119 lb 0.8 oz) (12/19 2018) Weight change: -4.6 kg (-10 lb 2.3 oz)  Intake/Output from previous day: 12/19 0701 - 12/20 0700 In: 1200 [P.O.:1200] Out: 0  Intake/Output this shift:    General appearance: alert, cooperative and no distress Resp: diminished breath sounds bilaterally Cardio: S1, S2 normal and systolic murmur: holosystolic 2/6, blowing at apex GI: pos bs,mild distension Extremities: edema 1+ and AVF LUA  Lab Results:  Recent Labs  10/10/15 2039 10/11/15 0648  WBC 7.2 5.6  HGB 11.2* 11.0*  HCT 35.0* 34.2*  PLT 149* 137*   BMET:  Recent Labs  10/10/15 2039 10/11/15 0648  NA 136 136  K 6.0* 3.6  CL 97* 96*  CO2 22 26  GLUCOSE 94 82  BUN 90* 24*  CREATININE 12.20* 4.90*  CALCIUM 10.5* 10.4*   No results for input(s): PTH in the last 72 hours. Iron Studies: No results for input(s): IRON, TIBC, TRANSFERRIN, FERRITIN in the last 72 hours.  Studies/Results: Dg Chest 2 View  10/10/2015  CLINICAL DATA:  Chest pain for 2 days. EXAM: CHEST  2 VIEW COMPARISON:  None. FINDINGS: There is a right chest wall AICD with lead in the right ventricle. Heart size is enlarged. No pleural effusion or edema identified. No airspace consolidation noted. IMPRESSION: 1. Cardiac enlargement. 2. No acute findings. Electronically Signed   By: Signa Kell M.D.   On: 10/10/2015 21:10    I have reviewed the patient's current medications.  Assessment/Plan: 1 ESRD for HD  .  2HTn can lower meds.   3 Anemia esa, 4 HPTH 5 nonadherence 6 Needs HD center,encouraged to try [place] [facility] HD, esa, lower bp meds.    LOS: 1 day    [name] Small L 10/12/2015,9:09 AM

## 2015-10-12 NOTE — Discharge Summary (Addendum)
Physician Discharge Summary   Patient ID: [name] Small MRN: [id number] DOB/AGE: [birth year] 24 y.o.  Admit date: 10/10/2015 Discharge date: 10/12/2015  Primary Care Physician:  No PCP Per Patient  Discharge Diagnoses:    . Hyperkalemia . Fluid overload   ESRD on hemodialysis, severely noncompliant, not attached to outpatient dialysis center  . Systolic CHF, chronic (HCC) . Anemia of chronic renal failure, stage 5 (HCC) . Chronically Elevated troponin . Hypertension . Chest pain  Consults:  Nephrology  Recommendations for Outpatient Follow-up:  1. Patient was recommended to seek hemodialysis outpatient Center at [facility]     DIET: Renal diet    Allergies:   Allergies  Allergen Reactions  . Tramadol Anaphylaxis  . Iohexol Itching  . Morphine And Related Itching    Ok with oxycodone  . Vicodin [Hydrocodone-Acetaminophen] Hives     DISCHARGE MEDICATIONS: Current Discharge Medication List    CONTINUE these medications which have CHANGED   Details  amLODipine (NORVASC) 2.5 MG tablet Take 1 tablet (2.5 mg total) by mouth at bedtime. Qty: 30 tablet, Refills: 3    LORazepam (ATIVAN) 1 MG tablet Take 1 tablet (1 mg total) by mouth daily as needed for anxiety. Patient states she takes one tablet by mouth every day per patient Qty: 20 tablet, Refills: 0    Oxycodone HCl 10 MG TABS Take 1 tablet (10 mg total) by mouth every 8 (eight) hours as needed. For pain Qty: 20 tablet, Refills: 0      CONTINUE these medications which have NOT CHANGED   Details  carvedilol (COREG) 6.25 MG tablet Take 1 tablet (6.25 mg total) by mouth 2 (two) times daily. Qty: 60 tablet, Refills: 0    cinacalcet (SENSIPAR) 30 MG tablet Take 1 tablet (30 mg total) by mouth daily with supper. Qty: 30 tablet, Refills: 0    isosorbide dinitrate (ISORDIL) 10 MG tablet Take 1 tablet (10 mg total) by mouth 3 (three) times daily. Qty: 90 tablet, Refills: 0    sevelamer carbonate (RENVELA) 800  MG tablet Take 3 tablets (2,400 mg total) by mouth 3 (three) times daily with meals. Qty: 120 tablet, Refills: 1         Brief H and P: For complete details please refer to admission H and P, but in brief [name] Small is a 24 y.o. 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  Her last dialysis was done as an inpatient at this facility 5 days ago. She used to live in [place], and then moved to [place], now has returned to [place]. She has not established with hemodialysis unit locally yet. ED Workup revealed hyperkalemia.  Hospital Course:  Fluid overload, anasarca with hyperkalemia in the setting of ESRD on hemodialysis, missed dialysis, severely noncompliant - Nephrology was consulted, patient underwent dialysis back-to-back 2 - She will need to be established with outpatient dialysis center as patient is not planning to return back to [place]. Patient is currently cleared to be discharged home per nephrology, discussed with Dr. Darrick Penna. Nephrology also counseled patient on seeking hemodialysis in [facility], she is currently not attached to [place] [facility] or  dialysis centers in [place].   ESRD (end stage renal disease) on dialysis [facility]) - Management per nephrology, as per #1   Systolic CHF, chronic (HCC), chronically elevated troponin - Volume management with hemodialysis - 2-D echo in June 2016 had shown EF of 20-25% with severely reduced systolic function, diffuse hypokinesis - Continue Coreg, amlodipine, isosorbide,  pain control.  - Patient was reminded to follow up outpatient with [facility] and can have a cardiology outpatient referral  Chronic pain syndrome - Complaining of pleuritic chest pain (reports that it gets better with Dilaudid, states oxycodone does not help much, patient asking for Dilaudid with the name as it "knocks her out"). No further escalation of pain medications, patient was noticed to have drug-seeking behavior..   Anemia of chronic renal failure, stage 5 (HCC) - H&H at baseline   Hypertension - Currently stable, continue amlodipine, Coreg,isosordil -Patient was recommended to follow up with community wellness [facility]     Day of Discharge BP 125/88 mmHg  Pulse 87  Temp(Src) 97.6 F (36.4 C) (Oral)  Resp 15  Wt 54.9 kg (121 lb 0.5 oz)  SpO2 94%  Physical Exam: General: Alert and awake oriented x3 not in any acute distress, Facial edema improving. HEENT: anicteric sclera, pupils reactive to light and accommodation CVS: S1-S2 clear no murmur rubs or gallops Chest: clear to auscultation bilaterally, no wheezing rales or rhonchi Abdomen: soft nontender, nondistended, normal bowel sounds Extremities: no cyanosis, clubbing or edema noted bilaterally Neuro: Cranial nerves II-XII intact, no focal neurological deficits   The results of significant diagnostics from this hospitalization (including imaging, microbiology, ancillary and laboratory) are listed below for reference.    LAB RESULTS: Basic Metabolic Panel:  Recent Labs Lab 10/11/15 0648 10/12/15 0915  NA 136 138  K 3.6 4.5  CL 96* 97*  CO2 26 24  GLUCOSE 82 135*  BUN 24* 43*  CREATININE 4.90* 7.92*  CALCIUM 10.4* 9.4  PHOS  --  6.9*   Liver Function Tests:  Recent Labs Lab 10/10/15 2039 10/12/15 0915  AST 15  --   ALT 10*  --   ALKPHOS 418*  --   BILITOT 1.1  --   PROT 8.2*  --   ALBUMIN 3.7 3.1*   No results for input(s): LIPASE, AMYLASE in the last 168 hours. No results for  input(s): AMMONIA in the last 168 hours. CBC:  Recent Labs Lab 10/10/15 2039 10/11/15 0648 10/12/15 0915  WBC 7.2 5.6 5.5  NEUTROABS 4.7  --   --   HGB 11.2* 11.0* 10.1*  HCT 35.0* 34.2* 31.8*  MCV 90.9 90.2 91.9  PLT 149* 137* 162   Cardiac Enzymes:  Recent Labs Lab 10/11/15 0648 10/11/15 1143  TROPONINI 0.15* 0.21*   BNP: Invalid input(s): POCBNP CBG: No results for input(s): GLUCAP in the last 168 hours.  Significant Diagnostic Studies:  Dg Chest 2 View  10/10/2015  CLINICAL DATA:  Chest pain for 2 days. EXAM: CHEST  2 VIEW COMPARISON:  None. FINDINGS: There is a right chest wall AICD with lead in the right ventricle. Heart size is enlarged. No pleural effusion or edema identified. No airspace consolidation noted. IMPRESSION: 1. Cardiac enlargement. 2. No acute findings. Electronically Signed   By: Signa Kell M.D.   On: 10/10/2015 21:10    2D ECHO:   Disposition and Follow-up:    DISPOSITION: home    DISCHARGE FOLLOW-UP Follow-up Information    Follow up with [facility].  Go on 10/21/2015.   Specialty:  Internal Medicine   Why:  at 2:00pm for a hospital follow up appointment with Dr Mauricio Po.    Contact information:   [address] [id number] mc [place] [address] [phone]       Time spent on Discharge: 35 mins   Signed:   Naseem Adler M.D. [facility] 10/12/2015, 2:55 PM Pager: [phone]

## 2015-10-12 NOTE — Procedures (Signed)
I was present at this session.  I have reviewed the session itself and made appropriate changes.  HD via LUA avf. Needles both up and closer than protocol. bp improving  [name] Small L 12/20/20169:08 AM

## 2015-10-12 NOTE — Progress Notes (Signed)
Patient Discharge: Disposition:   Patient discharged to home. Education: Reviewed the medications, prescriptions, follow-up  Appointment, and discharge instructions, understood and acknowledged. IV: Discontinued IV before discharge. Telemetry: Discontinued tele before discharge, CCMD notified. Transportation: Patient walked out of the unit with the staff member accompanying her, refused w/c. Belongings: Patient took all her belongings with her.

## 2015-10-12 NOTE — Care Management Note (Signed)
Case Management Note  Patient Details  Name: [name] Small MRN: [id number] Date of Birth: [birth year]  Subjective/Objective:      CM following for progression and d/c planning.              Action/Plan: 10/12/2015 spoke with [facility] and [facility] re an appointment for this pt and they were unable to schedule, however with the help of their Case manager, Erskine Squibb, the pt was scheduled with [facility]  For [place] [facility], Oct 21, 2015 @ 2pm. This was discussed with the pt and this CM stressed the need for the pt to cancel the appointment if she was unable to keep the appointment. Pt is agreeable to this appointment at this time.   Expected Discharge Date:    10/12/2015              Expected Discharge Plan:  Home/Self Care  In-House Referral:  NA  Discharge planning Services  CM Consult, Indigent Health Clinic  Post Acute Care Choice:  NA Choice offered to:  NA  DME Arranged:  N/A DME Agency:  NA  HH Arranged:  NA HH Agency:  NA  Status of Service:  Completed, signed off  Medicare Important Message Given:    Date Medicare IM Given:    Medicare IM give by:    Date Additional Medicare IM Given:    Additional Medicare Important Message give by:     If discussed at Long Length of Stay Meetings, dates discussed:    Additional Comments:  Starlyn Skeans, RN 10/12/2015, 3:24 PM

## 2015-10-12 NOTE — Telephone Encounter (Signed)
Call received from [facility], RN CM requesting a hospital follow up appointment for the patient.  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  The visit information was placed on the AVS .  Update provided to C. Royal, RN CM.

## 2015-10-12 NOTE — Care Management Note (Signed)
Case Management Note  Patient Details  Name: [name] Small MRN: [id number] Date of Birth: 1991-06-09  Subjective/Objective:                 CM following for progression and d/c planning.   Action/Plan: Attempted to arrange a followup appointment at [facility] and [facility]. Per their recommendation and given the pt freq admission, they are recommending the [facility]. They are currently working on an appointment for this pt.   Expected Discharge Date:                  Expected Discharge Plan:  Home/Self Care  In-House Referral:  NA  Discharge planning Services  CM Consult, Indigent Health Clinic  Post Acute Care Choice:  NA Choice offered to:  NA  DME Arranged:  N/A DME Agency:  NA  HH Arranged:  NA HH Agency:  NA  Status of Service:  In process, will continue to follow  Medicare Important Message Given:    Date Medicare IM Given:    Medicare IM give by:    Date Additional Medicare IM Given:    Additional Medicare Important Message give by:     If discussed at Long Length of Stay Meetings, dates discussed:    Additional Comments:  Starlyn Skeans, RN 10/12/2015, 1:16 PM

## 2015-10-21 ENCOUNTER — Inpatient Hospital Stay: Payer: Medicare Other | Admitting: Family Medicine

## 2015-10-26 ENCOUNTER — Emergency Department (HOSPITAL_COMMUNITY): Payer: Medicare Other

## 2015-10-26 ENCOUNTER — Encounter (HOSPITAL_COMMUNITY): Payer: Self-pay | Admitting: Family Medicine

## 2015-10-26 ENCOUNTER — Emergency Department (HOSPITAL_COMMUNITY)
Admission: EM | Admit: 2015-10-26 | Discharge: 2015-10-26 | Disposition: A | Discharge status: Home / Self Care | Payer: Medicare Other | Source: Home / Self Care | Attending: Emergency Medicine | Admitting: Emergency Medicine

## 2015-10-26 DIAGNOSIS — F17210 Nicotine dependence, cigarettes, uncomplicated: Secondary | ICD-10-CM

## 2015-10-26 DIAGNOSIS — Z8673 Personal history of transient ischemic attack (TIA), and cerebral infarction without residual deficits: Secondary | ICD-10-CM | POA: Insufficient documentation

## 2015-10-26 DIAGNOSIS — R109 Unspecified abdominal pain: Secondary | ICD-10-CM

## 2015-10-26 DIAGNOSIS — Z8659 Personal history of other mental and behavioral disorders: Secondary | ICD-10-CM

## 2015-10-26 DIAGNOSIS — Z8719 Personal history of other diseases of the digestive system: Secondary | ICD-10-CM | POA: Insufficient documentation

## 2015-10-26 DIAGNOSIS — N186 End stage renal disease: Secondary | ICD-10-CM | POA: Insufficient documentation

## 2015-10-26 DIAGNOSIS — G8929 Other chronic pain: Secondary | ICD-10-CM | POA: Insufficient documentation

## 2015-10-26 DIAGNOSIS — Q613 Polycystic kidney, unspecified: Secondary | ICD-10-CM | POA: Insufficient documentation

## 2015-10-26 DIAGNOSIS — E8779 Other fluid overload: Secondary | ICD-10-CM

## 2015-10-26 DIAGNOSIS — Z992 Dependence on renal dialysis: Secondary | ICD-10-CM | POA: Insufficient documentation

## 2015-10-26 DIAGNOSIS — Z8781 Personal history of (healed) traumatic fracture: Secondary | ICD-10-CM

## 2015-10-26 DIAGNOSIS — I120 Hypertensive chronic kidney disease with stage 5 chronic kidney disease or end stage renal disease: Secondary | ICD-10-CM

## 2015-10-26 DIAGNOSIS — M545 Low back pain: Secondary | ICD-10-CM

## 2015-10-26 DIAGNOSIS — Z79899 Other long term (current) drug therapy: Secondary | ICD-10-CM | POA: Insufficient documentation

## 2015-10-26 DIAGNOSIS — Z872 Personal history of diseases of the skin and subcutaneous tissue: Secondary | ICD-10-CM

## 2015-10-26 DIAGNOSIS — Z862 Personal history of diseases of the blood and blood-forming organs and certain disorders involving the immune mechanism: Secondary | ICD-10-CM | POA: Insufficient documentation

## 2015-10-26 HISTORY — DX: Fracture of unspecified parts of lumbosacral spine and pelvis, initial encounter for closed fracture: S32.9XXA

## 2015-10-26 LAB — LIPASE, BLOOD: Lipase: 50 U/L (ref 11–51)

## 2015-10-26 LAB — BLOOD GAS, VENOUS
ACID-BASE EXCESS: 1 mmol/L (ref 0.0–2.0)
Bicarbonate: 26 mEq/L — ABNORMAL HIGH (ref 20.0–24.0)
DRAWN BY: 409391
FIO2: 0.21
O2 SAT: 62.1 %
PCO2 VEN: 45.9 mmHg (ref 45.0–50.0)
PO2 VEN: 37.6 mmHg (ref 30.0–45.0)
Patient temperature: 98.4
TCO2: 24.6 mmol/L (ref 0–100)
pH, Ven: 7.372 — ABNORMAL HIGH (ref 7.250–7.300)

## 2015-10-26 LAB — I-STAT CHEM 8, ED
BUN: 61 mg/dL — AB (ref 6–20)
CHLORIDE: 103 mmol/L (ref 101–111)
CREATININE: 8.2 mg/dL — AB (ref 0.44–1.00)
Calcium, Ion: 1.15 mmol/L (ref 1.12–1.23)
Glucose, Bld: 91 mg/dL (ref 65–99)
HCT: 38 % (ref 36.0–46.0)
Hemoglobin: 12.9 g/dL (ref 12.0–15.0)
POTASSIUM: 5.1 mmol/L (ref 3.5–5.1)
SODIUM: 142 mmol/L (ref 135–145)
TCO2: 29 mmol/L (ref 0–100)

## 2015-10-26 LAB — HEPATIC FUNCTION PANEL
ALBUMIN: 4.1 g/dL (ref 3.5–5.0)
ALT: 12 U/L — AB (ref 14–54)
AST: 23 U/L (ref 15–41)
Alkaline Phosphatase: 450 U/L — ABNORMAL HIGH (ref 38–126)
BILIRUBIN DIRECT: 0.3 mg/dL (ref 0.1–0.5)
BILIRUBIN TOTAL: 1 mg/dL (ref 0.3–1.2)
Indirect Bilirubin: 0.7 mg/dL (ref 0.3–0.9)
Total Protein: 8.4 g/dL — ABNORMAL HIGH (ref 6.5–8.1)

## 2015-10-26 LAB — CBC
HCT: 33.4 % — ABNORMAL LOW (ref 36.0–46.0)
Hemoglobin: 10.5 g/dL — ABNORMAL LOW (ref 12.0–15.0)
MCH: 29.2 pg (ref 26.0–34.0)
MCHC: 31.4 g/dL (ref 30.0–36.0)
MCV: 93 fL (ref 78.0–100.0)
PLATELETS: 174 10*3/uL (ref 150–400)
RBC: 3.59 MIL/uL — AB (ref 3.87–5.11)
RDW: 18.1 % — AB (ref 11.5–15.5)
WBC: 7.3 10*3/uL (ref 4.0–10.5)

## 2015-10-26 LAB — BASIC METABOLIC PANEL
Anion gap: 15 (ref 5–15)
BUN: 62 mg/dL — AB (ref 6–20)
CHLORIDE: 102 mmol/L (ref 101–111)
CO2: 26 mmol/L (ref 22–32)
Calcium: 10.4 mg/dL — ABNORMAL HIGH (ref 8.9–10.3)
Creatinine, Ser: 8.46 mg/dL — ABNORMAL HIGH (ref 0.44–1.00)
GFR calc Af Amer: 7 mL/min — ABNORMAL LOW (ref >60)
GFR calc non Af Amer: 6 mL/min — ABNORMAL LOW (ref >60)
GLUCOSE: 95 mg/dL (ref 65–99)
POTASSIUM: 5.2 mmol/L — AB (ref 3.5–5.1)
SODIUM: 143 mmol/L (ref 135–145)

## 2015-10-26 LAB — I-STAT TROPONIN, ED
TROPONIN I, POC: 0.07 ng/mL (ref 0.00–0.08)
Troponin i, poc: 0.07 ng/mL (ref 0.00–0.08)

## 2015-10-26 LAB — I-STAT BETA HCG BLOOD, ED (MC, WL, AP ONLY): HCG, QUANTITATIVE: 18.2 m[IU]/mL — AB (ref <5)

## 2015-10-26 LAB — HCG, QUANTITATIVE, PREGNANCY: hCG, Beta Chain, Quant, S: 30 m[IU]/mL — ABNORMAL HIGH (ref <5)

## 2015-10-26 MED ORDER — HYDROMORPHONE HCL 1 MG/ML IJ SOLN
1.0000 mg | Freq: Once | INTRAMUSCULAR | Status: AC
  Administered 2015-10-26: 1 mg via INTRAVENOUS
  Filled 2015-10-26: qty 1

## 2015-10-26 MED ORDER — ONDANSETRON HCL 4 MG/2ML IJ SOLN
4.0000 mg | Freq: Once | INTRAMUSCULAR | Status: AC
  Administered 2015-10-26: 4 mg via INTRAVENOUS
  Filled 2015-10-26: qty 2

## 2015-10-26 MED ORDER — OXYCODONE HCL 5 MG PO TABS: 5.0000 mg | ORAL_TABLET | ORAL | Status: DC | PRN

## 2015-10-26 MED ORDER — OXYCODONE HCL 5 MG PO TABS
5.0000 mg | ORAL_TABLET | Freq: Once | ORAL | Status: AC
  Administered 2015-10-26: 5 mg via ORAL
  Filled 2015-10-26: qty 1

## 2015-10-26 MED ORDER — NITROGLYCERIN 0.4 MG SL SUBL
0.4000 mg | SUBLINGUAL_TABLET | SUBLINGUAL | Status: DC | PRN
Start: 2015-10-26 — End: 2015-10-26
  Administered 2015-10-26: 0.4 mg via SUBLINGUAL
  Filled 2015-10-26: qty 1

## 2015-10-26 MED ORDER — ASPIRIN 81 MG PO CHEW
324.0000 mg | CHEWABLE_TABLET | Freq: Once | ORAL | Status: DC
  Filled 2015-10-26: qty 4

## 2015-10-26 MED ORDER — ONDANSETRON HCL 4 MG/2ML IJ SOLN
4.0000 mg | Freq: Once | INTRAMUSCULAR | Status: AC
Start: 2015-10-26 — End: 2015-10-26
  Administered 2015-10-26: 4 mg via INTRAVENOUS
  Filled 2015-10-26: qty 2

## 2015-10-26 MED ORDER — DIPHENHYDRAMINE HCL 25 MG PO CAPS
50.0000 mg | ORAL_CAPSULE | Freq: Once | ORAL | Status: AC
  Administered 2015-10-26: 50 mg via ORAL
  Filled 2015-10-26: qty 2

## 2015-10-26 NOTE — ED Notes (Signed)
Patient is experiencing mid sternal chest pain that started yesterday morning while watching television. Pt reports she has had this chest pain before and dx with CHF or PE. Pt is a dialysis patient. She denies missing any treatments with Saturday being her last treatment. Also complains of left sided kidney pain.

## 2015-10-26 NOTE — Discharge Instructions (Signed)
Take 4 over the counter ibuprofen tablets 3 times a day or 2 over-the-counter naproxen tablets twice a day for pain.  Flank Pain Flank pain is pain in your side. The flank is the area of your side between your upper belly (abdomen) and your back. Pain in this area can be caused by many different things. HOME CARE Home care and treatment will depend on the cause of your pain.  Rest as told by your doctor.  Drink enough fluids to keep your pee (urine) clear or pale yellow.  Only take medicine as told by your doctor.  Tell your doctor about any changes in your pain.  Follow up with your doctor. GET HELP RIGHT AWAY IF:   Your pain does not get better with medicine.   You have new symptoms or your symptoms get worse.  Your pain gets worse.   You have belly (abdominal) pain.   You are short of breath.   You always feel sick to your stomach (nauseous).   You keep throwing up (vomiting).   You have puffiness (swelling) in your belly.   You feel light-headed or you pass out (faint).   You have blood in your pee.  You have a fever or lasting symptoms for more than 2-3 days.  You have a fever and your symptoms suddenly get worse. MAKE SURE YOU:   Understand these instructions.  Will watch your condition.  Will get help right away if you are not doing well or get worse.   This information is not intended to replace advice given to you by your health care provider. Make sure you discuss any questions you have with your health care provider.   Document Released: 07/18/2008 Document Revised: 10/30/2014 Document Reviewed: 05/23/2012 Elsevier Interactive Patient Education [employer]

## 2015-10-26 NOTE — ED Notes (Signed)
RN Verlon Au working on IV currently

## 2015-10-26 NOTE — ED Notes (Signed)
EKG given to MD

## 2015-10-26 NOTE — ED Provider Notes (Signed)
CSN: [id number]     Arrival date & time 10/26/15  0043 History  By signing my name below, I, Doreatha Martin, attest that this documentation has been prepared under the direction and in the presence of [name], DO. Electronically Signed: Doreatha Martin, ED Scribe. 10/26/2015. 1:34 AM.    Chief Complaint  Patient presents with  . Chest Pain   Patient is a 25 y.o. female presenting with back pain. The history is provided by the patient. No language interpreter was used.  Back Pain Location:  Lumbar spine Quality:  Aching Radiates to:  Does not radiate Pain severity:  Moderate Pain is:  Same all the time Onset quality:  Sudden Duration:  12 hours Timing:  Constant Progression:  Worsening Chronicity:  Recurrent Context: not jumping from heights, not lifting heavy objects, not occupational injury, not recent injury and not twisting   Relieved by:  Nothing Worsened by:  Movement Ineffective treatments:  None tried Associated symptoms: chest pain   Associated symptoms: no dysuria, no fever and no headaches   Risk factors comment:  PKD, ESRD   HPI Comments: [name] Small is a 25 y.o. female with h/o ESRD on hemodialysis no longer producing urine, HTN, PE, CHF, anemia, PKD, renal insufficiency, sickle cell anemia, GERD, cardioverter defibrillator, AV fistula who presents to the Emergency Department complaining of moderate, sudden onset, constant lower back pain onset this afternoon. Pt states she was dialyzed 2 days ago and is due again tomorrow. No missed appointments. Pt has been seen in the ED frequently for CP. She denies fever, recent injury or trauma, worsened CP from baseline. Pt states pain is not similar to h/o PE, but is similar to PKD. H/o failed kidney transplant.   Past Medical History  Diagnosis Date  . Hemodialysis patient (HCC)   . Hypertension   . Pulmonary emboli (HCC) 01/2012    Bilateral, moderate clot burden, areas of pulmonary infarction and central necrosis  . CHF  (congestive heart failure) (HCC)   . Cardiomyopathy   . Dysrhythmia     at times per pt.  . Anemia   . H/O transfusion of packed red blood cells   . 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 Polycystic kidney disease   . Cellulitis and abscess of face 03/22/2013  . Renal insufficiency   . Sickle cell anemia (HCC)   . Chronic pain   . GERD (gastroesophageal reflux disease)   . Depression   . Hyperkalemia 09/2015  . Pelvic fracture [facility])    Past Surgical History  Procedure Laterality Date  . Nephrectomy    . Av fistula placement    . Kidney transplant  2008    failed  . Tonsillectomy      as a child.  . Adenoidectomy    . Incision and drainage abscess Right 03/21/2013    Procedure: INCISION AND DRAINAGE RIGHT CHEEK ABSCESS REMOVAL OF FOREIGN BODY;  Surgeon: Serena Colonel, MD;  Location: [facility] OR;  Service: ENT;  Laterality: Right;  . Implantable cardioverter defibrillator implant Right 12/2014   Family History  Problem Relation Age of Onset  . Polycystic kidney disease Father    Social History  Substance Use Topics  . Smoking status: Current Some Day Smoker -- 1 years    Types: Cigarettes  . Smokeless tobacco: Never Used  . Alcohol Use: No   OB History    No data  available     Review of Systems  Constitutional: Negative for fever and chills.  HENT: Negative for congestion and rhinorrhea.   Eyes: Negative for redness and visual disturbance.  Respiratory: Negative for shortness of breath and wheezing.   Cardiovascular: Positive for chest pain. Negative for palpitations.  Gastrointestinal: Negative for nausea and vomiting.  Genitourinary: Negative for dysuria and urgency.  Musculoskeletal: Positive for back pain. Negative for myalgias and arthralgias.  Skin: Negative for pallor and wound.  Neurological: Negative for dizziness and headaches.  All other systems reviewed and  are negative.  Allergies  Tramadol; Iohexol; Morphine and related; and Vicodin  Home Medications   Prior to Admission medications   Medication Sig Start Date End Date Taking? Authorizing Provider  amLODipine (NORVASC) 10 MG tablet Take 5 mg by mouth daily.   Yes Historical Provider, MD  carvedilol (COREG) 6.25 MG tablet Take 1 tablet (6.25 mg total) by mouth 2 (two) times daily. 09/25/15  Yes Zannie Cove, MD  isosorbide dinitrate (ISORDIL) 10 MG tablet Take 1 tablet (10 mg total) by mouth 3 (three) times daily. 09/25/15  Yes Zannie Cove, MD  LORazepam (ATIVAN) 1 MG tablet Take 1 tablet (1 mg total) by mouth daily as needed for anxiety. Patient states she takes one tablet by mouth every day per patient 10/12/15  Yes Ripudeep Jenna Luo, MD  Oxycodone HCl 10 MG TABS Take 1 tablet (10 mg total) by mouth every 8 (eight) hours as needed. For pain 10/12/15  Yes Ripudeep Jenna Luo, MD  sevelamer carbonate (RENVELA) 800 MG tablet Take 3 tablets (2,400 mg total) by mouth 3 (three) times daily with meals. 09/25/15 09/24/16 Yes Zannie Cove, MD  amLODipine (NORVASC) 2.5 MG tablet Take 1 tablet (2.5 mg total) by mouth at bedtime. Patient not taking: Reported on 10/26/2015 10/12/15   Ripudeep Jenna Luo, MD  cinacalcet (SENSIPAR) 30 MG tablet Take 1 tablet (30 mg total) by mouth daily with supper. Patient not taking: Reported on 10/26/2015 09/25/15   Zannie Cove, MD  oxyCODONE (ROXICODONE) 5 MG immediate release tablet Take 1 tablet (5 mg total) by mouth every 4 (four) hours as needed for severe pain. 10/26/15   [name], DO   BP 128/99 mmHg  Pulse 98  Temp(Src) 98.4 F (36.9 C) (Oral)  Resp 23  Ht 5\' 3"  (1.6 m)  Wt 110 lb (49.896 kg)  BMI 19.49 kg/m2  SpO2 99% Physical Exam  Constitutional: She is oriented to person, place, and time. She appears well-developed and well-nourished.  HENT:  Head: Normocephalic and atraumatic.  Eyes: Conjunctivae and EOM are normal. Pupils are equal, round, and reactive to  light.  Neck: Normal range of motion. Neck supple.  Cardiovascular: Normal rate.   Pulmonary/Chest: Effort normal and breath sounds normal. No respiratory distress. She has no wheezes. She has no rales. She exhibits no tenderness.  Lungs CTA bilaterally. Port in place. No erythema or TTP.   Abdominal: Soft. She exhibits no distension and no mass. There is no tenderness. There is no rebound and no guarding.  No abdominal tenderness.   Musculoskeletal: Normal range of motion. She exhibits tenderness.  Pain along the mid axillary line below the rib margin. Mild pain to left lower paraspinal musculature.   Neurological: She is alert and oriented to person, place, and time.  Skin: Skin is warm and dry.  Psychiatric: She has a normal mood and affect. Her behavior is normal.  Nursing note and vitals reviewed.   ED Course  Procedures (including critical care time) DIAGNOSTIC STUDIES: Oxygen Saturation is 100% on RA, normal by my interpretation.    COORDINATION OF CARE: 1:22 AM Discussed treatment plan with pt at bedside and pt agreed to plan.   Labs Review Labs Reviewed  CBC - Abnormal; Notable for the following:    RBC 3.59 (*)    Hemoglobin 10.5 (*)    HCT 33.4 (*)    RDW 18.1 (*)    All other components within normal limits  BASIC METABOLIC PANEL - Abnormal; Notable for the following:    Potassium 5.2 (*)    BUN 62 (*)    Creatinine, Ser 8.46 (*)    Calcium 10.4 (*)    GFR calc non Af Amer 6 (*)    GFR calc Af Amer 7 (*)    All other components within normal limits  HEPATIC FUNCTION PANEL - Abnormal; Notable for the following:    Total Protein 8.4 (*)    ALT 12 (*)    Alkaline Phosphatase 450 (*)    All other components within normal limits  BLOOD GAS, VENOUS - Abnormal; Notable for the following:    pH, Ven 7.372 (*)    Bicarbonate 26.0 (*)    All other components within normal limits  HCG, QUANTITATIVE, PREGNANCY - Abnormal; Notable for the following:    hCG, Beta Chain,  Quant, S 30 (*)    All other components within normal limits  I-STAT CHEM 8, ED - Abnormal; Notable for the following:    BUN 61 (*)    Creatinine, Ser 8.20 (*)    All other components within normal limits  I-STAT BETA HCG BLOOD, ED ([facility], [facility], [facility] ONLY) - Abnormal; Notable for the following:    I-stat hCG, quantitative 18.2 (*)    All other components within normal limits  LIPASE, BLOOD  I-STAT TROPOININ, ED  I-STAT TROPOININ, ED    Imaging Review Dg Chest 2 View  10/26/2015  CLINICAL DATA:  Acute onset of generalized chest pain and shortness of breath. Initial encounter. EXAM: CHEST  2 VIEW COMPARISON:  Chest radiograph performed 10/20/2015, and CTA of the chest performed 10/15/2015 FINDINGS: The lungs are well-aerated and clear. There is no evidence of focal opacification, pleural effusion or pneumothorax. The heart is enlarged. An AICD is noted at the right chest wall, with a lead ending at the right ventricle. No acute osseous abnormalities are seen. IMPRESSION: 1. No acute cardiopulmonary process seen. 2. Cardiomegaly noted. Electronically Signed   By: Roanna Raider M.D.   On: 10/26/2015 03:11   Ct Renal Stone Study  10/26/2015  CLINICAL DATA:  Acute onset of left flank pain.  Initial encounter. EXAM: CT ABDOMEN AND PELVIS WITHOUT CONTRAST TECHNIQUE: Multidetector CT imaging of the abdomen and pelvis was performed following the standard protocol without IV contrast. COMPARISON:  Abdominal ultrasound performed 02/24/2015, and CT of the abdomen and pelvis from 02/03/2012 FINDINGS: A 1.5 cm calcified opacity at the left lung base is likely benign, given the patient's age. The heart is enlarged.  A pacemaker lead is partially imaged. Mild-to-moderate abdominopelvic ascites is noted. The liver and spleen are unremarkable in appearance. Trace fluid about the gallbladder is nonspecific in the presence of ascites. The pancreas and adrenal glands are unremarkable. Scattered hypodense and hyperdense foci  within both kidneys likely reflect cysts. Scattered calcification is noted about both kidneys. Mild bilateral renal atrophy is noted. No perinephric stranding is appreciated. No free fluid is identified. The small bowel is  unremarkable in appearance. The stomach is within normal limits. No acute vascular abnormalities are seen. An IVC filter is noted in expected position. Scattered vascular calcifications are seen. The appendix is not definitely characterized; there is no evidence for appendicitis. The colon is grossly unremarkable in appearance. The bladder is relatively decompressed and not well assessed. The uterus is grossly unremarkable in appearance. The ovaries are relatively symmetric. No suspicious adnexal masses are seen. No inguinal lymphadenopathy is seen. No acute osseous abnormalities are identified. Degenerative resorption and fragments are noted at the right side of the pubic symphysis. Chronic resorption is noted at the sacroiliac joints bilaterally. IMPRESSION: 1. Mild to moderate volume abdominopelvic ascites noted. 2. Cardiomegaly noted. 3. Mild bilateral renal atrophy, with scattered hypodense and hyperdense foci in both kidneys. 4. Scattered vascular calcifications seen. 5. Chronic resorption at the sacroiliac joints bilaterally, and degenerative resorption and fragments at the right side of the pubic symphysis. Electronically Signed   By: Roanna Raider M.D.   On: 10/26/2015 04:55   I have personally reviewed and evaluated these images and lab results as part of my medical decision-making.  ED ECG REPORT   Date: 10/26/2015  Rate: 97  Rhythm: normal sinus rhythm  QRS Axis: normal  Intervals: normal  ST/T Wave abnormalities: nonspecific ST changes  Conduction Disutrbances:none  Narrative Interpretation:   Old EKG Reviewed: unchanged  I have personally reviewed the EKG tracing and agree with the computerized printout as noted.   MDM   Final diagnoses:  Left flank pain   Other hypervolemia    25 yo F with acute left-sided flank pain. Patient denies any acute injury. Reproducible with palpation in that area. Patient does not urinate. Will obtain a CT scan to evaluate for possible stones versus hydro. Patient has a history of polycystic kidney disease. I-STAT hCG was positive. Patient is definitive that she is not had sexual contact and is not pregnant. Formal lab result was 30. This consistent with her prior lab results when patient was not pregnant. We'll obtain the CT scan after patient was informed and signed consent.  CT scan is negative for acute pathology. Patient has mild worsening fluid overload. Discussed need to follow up with dialysis. Patient is concerned that this may been a ruptured cyst in her kidney. Discussed that if that is the case is not a life-threatening events and that she still safe for discharge home. I personally performed the services described in this documentation, which was scribed in my presence. The recorded information has been reviewed and is accurate.    6:02 AM:  I have discussed the diagnosis/risks/treatment options with the patient and believe the pt to be eligible for discharge home to follow-up with PCP. We also discussed returning to the ED immediately if new or worsening sx occur. We discussed the sx which are most concerning (e.g., sudden worsening pain, fever, inability to tolerate by mouth) that necessitate immediate return. Medications administered to the patient during their visit and any new prescriptions provided to the patient are listed below.  Medications given during this visit Medications  aspirin chewable tablet 324 mg (324 mg Oral Not Given 10/26/15 0109)  nitroGLYCERIN (NITROSTAT) SL tablet 0.4 mg (0.4 mg Sublingual Given 10/26/15 0108)  HYDROmorphone (DILAUDID) injection 1 mg (1 mg Intravenous Given 10/26/15 0144)  ondansetron (ZOFRAN) injection 4 mg (4 mg Intravenous Given 10/26/15 0144)  diphenhydrAMINE (BENADRYL)  capsule 50 mg (50 mg Oral Given 10/26/15 0221)  HYDROmorphone (DILAUDID) injection 1 mg (1 mg Intravenous  Given 10/26/15 0345)  ondansetron (ZOFRAN) injection 4 mg (4 mg Intravenous Given 10/26/15 0345)  oxyCODONE (Oxy IR/ROXICODONE) immediate release tablet 5 mg (5 mg Oral Given 10/26/15 0526)    Discharge Medication List as of 10/26/2015  5:11 AM    START taking these medications   Details  !! oxyCODONE (ROXICODONE) 5 MG immediate release tablet Take 1 tablet (5 mg total) by mouth every 4 (four) hours as needed for severe pain., Starting 10/26/2015, Until Discontinued, Print     !! - Potential duplicate medications found. Please discuss with provider.      The patient appears reasonably screen and/or stabilized for discharge and I doubt any other medical condition or other [facility] requiring further screening, evaluation, or treatment in the ED at this time prior to discharge.    [name], DO 10/26/15 0602

## 2015-10-26 NOTE — ED Notes (Signed)
Informed MD of venous gas results

## 2015-10-27 ENCOUNTER — Inpatient Hospital Stay (HOSPITAL_COMMUNITY)
Admission: EM | Admit: 2015-10-27 | Discharge: 2015-10-29 | DRG: 313 | Disposition: A | Discharge status: Home / Self Care | Payer: Medicare Other | Attending: Family Medicine | Admitting: Family Medicine

## 2015-10-27 ENCOUNTER — Encounter (HOSPITAL_COMMUNITY): Payer: Self-pay | Admitting: *Deleted

## 2015-10-27 ENCOUNTER — Emergency Department (HOSPITAL_COMMUNITY): Payer: Medicare Other

## 2015-10-27 DIAGNOSIS — I440 Atrioventricular block, first degree: Secondary | ICD-10-CM | POA: Diagnosis present

## 2015-10-27 DIAGNOSIS — N2581 Secondary hyperparathyroidism of renal origin: Secondary | ICD-10-CM | POA: Diagnosis present

## 2015-10-27 DIAGNOSIS — E875 Hyperkalemia: Secondary | ICD-10-CM | POA: Diagnosis present

## 2015-10-27 DIAGNOSIS — Z86711 Personal history of pulmonary embolism: Secondary | ICD-10-CM

## 2015-10-27 DIAGNOSIS — D573 Sickle-cell trait: Secondary | ICD-10-CM | POA: Diagnosis present

## 2015-10-27 DIAGNOSIS — Q613 Polycystic kidney, unspecified: Secondary | ICD-10-CM | POA: Diagnosis not present

## 2015-10-27 DIAGNOSIS — R000 Tachycardia, unspecified: Secondary | ICD-10-CM | POA: Insufficient documentation

## 2015-10-27 DIAGNOSIS — G8929 Other chronic pain: Secondary | ICD-10-CM | POA: Diagnosis present

## 2015-10-27 DIAGNOSIS — I429 Cardiomyopathy, unspecified: Secondary | ICD-10-CM | POA: Diagnosis present

## 2015-10-27 DIAGNOSIS — I5022 Chronic systolic (congestive) heart failure: Secondary | ICD-10-CM | POA: Diagnosis present

## 2015-10-27 DIAGNOSIS — I151 Hypertension secondary to other renal disorders: Secondary | ICD-10-CM | POA: Diagnosis present

## 2015-10-27 DIAGNOSIS — Z992 Dependence on renal dialysis: Secondary | ICD-10-CM | POA: Insufficient documentation

## 2015-10-27 DIAGNOSIS — F329 Major depressive disorder, single episode, unspecified: Secondary | ICD-10-CM | POA: Diagnosis present

## 2015-10-27 DIAGNOSIS — Z720 Tobacco use: Secondary | ICD-10-CM

## 2015-10-27 DIAGNOSIS — D571 Sickle-cell disease without crisis: Secondary | ICD-10-CM | POA: Diagnosis present

## 2015-10-27 DIAGNOSIS — Z950 Presence of cardiac pacemaker: Secondary | ICD-10-CM

## 2015-10-27 DIAGNOSIS — E8779 Other fluid overload: Secondary | ICD-10-CM | POA: Diagnosis present

## 2015-10-27 DIAGNOSIS — Z79891 Long term (current) use of opiate analgesic: Secondary | ICD-10-CM | POA: Diagnosis not present

## 2015-10-27 DIAGNOSIS — K219 Gastro-esophageal reflux disease without esophagitis: Secondary | ICD-10-CM | POA: Diagnosis present

## 2015-10-27 DIAGNOSIS — I110 Hypertensive heart disease with heart failure: Secondary | ICD-10-CM | POA: Diagnosis present

## 2015-10-27 DIAGNOSIS — T8612 Kidney transplant failure: Secondary | ICD-10-CM | POA: Diagnosis present

## 2015-10-27 DIAGNOSIS — R0789 Other chest pain: Secondary | ICD-10-CM | POA: Diagnosis not present

## 2015-10-27 DIAGNOSIS — F419 Anxiety disorder, unspecified: Secondary | ICD-10-CM | POA: Diagnosis present

## 2015-10-27 DIAGNOSIS — E349 Endocrine disorder, unspecified: Secondary | ICD-10-CM | POA: Diagnosis not present

## 2015-10-27 DIAGNOSIS — R7989 Other specified abnormal findings of blood chemistry: Secondary | ICD-10-CM | POA: Insufficient documentation

## 2015-10-27 DIAGNOSIS — D631 Anemia in chronic kidney disease: Secondary | ICD-10-CM | POA: Diagnosis present

## 2015-10-27 DIAGNOSIS — Y836 Removal of other organ (partial) (total) as the cause of abnormal reaction of the patient, or of later complication, without mention of misadventure at the time of the procedure: Secondary | ICD-10-CM | POA: Diagnosis present

## 2015-10-27 DIAGNOSIS — R079 Chest pain, unspecified: Secondary | ICD-10-CM | POA: Diagnosis present

## 2015-10-27 DIAGNOSIS — N186 End stage renal disease: Secondary | ICD-10-CM | POA: Diagnosis present

## 2015-10-27 LAB — I-STAT TROPONIN, ED: Troponin i, poc: 0.05 ng/mL (ref 0.00–0.08)

## 2015-10-27 LAB — I-STAT BETA HCG BLOOD, ED (MC, WL, AP ONLY): HCG, QUANTITATIVE: 26.1 m[IU]/mL — AB (ref <5)

## 2015-10-27 LAB — BASIC METABOLIC PANEL
ANION GAP: 14 (ref 5–15)
BUN: 73 mg/dL — AB (ref 6–20)
CALCIUM: 10.2 mg/dL (ref 8.9–10.3)
CO2: 23 mmol/L (ref 22–32)
Chloride: 103 mmol/L (ref 101–111)
Creatinine, Ser: 10.23 mg/dL — ABNORMAL HIGH (ref 0.44–1.00)
GFR calc Af Amer: 5 mL/min — ABNORMAL LOW (ref >60)
GFR, EST NON AFRICAN AMERICAN: 5 mL/min — AB (ref >60)
GLUCOSE: 78 mg/dL (ref 65–99)
Potassium: 7.1 mmol/L (ref 3.5–5.1)
SODIUM: 140 mmol/L (ref 135–145)

## 2015-10-27 LAB — CBC
HEMATOCRIT: 32.5 % — AB (ref 36.0–46.0)
HEMOGLOBIN: 10.1 g/dL — AB (ref 12.0–15.0)
MCH: 28.4 pg (ref 26.0–34.0)
MCHC: 31.1 g/dL (ref 30.0–36.0)
MCV: 91.3 fL (ref 78.0–100.0)
Platelets: 164 10*3/uL (ref 150–400)
RBC: 3.56 MIL/uL — ABNORMAL LOW (ref 3.87–5.11)
RDW: 17.8 % — AB (ref 11.5–15.5)
WBC: 5.6 10*3/uL (ref 4.0–10.5)

## 2015-10-27 MED ORDER — RENA-VITE PO TABS
1.0000 | ORAL_TABLET | Freq: Every day | ORAL | Status: DC
  Administered 2015-10-28 (×2): 1 via ORAL
  Filled 2015-10-27 (×2): qty 1

## 2015-10-27 MED ORDER — DEXTROSE 50 % IV SOLN
50.0000 mL | Freq: Once | INTRAVENOUS | Status: AC
  Administered 2015-10-27: 50 mL via INTRAVENOUS
  Filled 2015-10-27: qty 50

## 2015-10-27 MED ORDER — HEPARIN SODIUM (PORCINE) 1000 UNIT/ML DIALYSIS
1000.0000 [IU] | INTRAMUSCULAR | Status: DC | PRN
  Filled 2015-10-27: qty 1

## 2015-10-27 MED ORDER — DIPHENHYDRAMINE HCL 25 MG PO CAPS
ORAL_CAPSULE | ORAL | Status: AC
  Administered 2015-10-27: 25 mg via ORAL
  Filled 2015-10-27: qty 1

## 2015-10-27 MED ORDER — HEPARIN SODIUM (PORCINE) 1000 UNIT/ML DIALYSIS
2000.0000 [IU] | Freq: Once | INTRAMUSCULAR | Status: DC
  Filled 2015-10-27: qty 2

## 2015-10-27 MED ORDER — AMLODIPINE BESYLATE 5 MG PO TABS
2.5000 mg | ORAL_TABLET | Freq: Every day | ORAL | Status: DC
  Administered 2015-10-28 (×2): 2.5 mg via ORAL
  Filled 2015-10-27 (×2): qty 1

## 2015-10-27 MED ORDER — SODIUM CHLORIDE 0.9 % IV SOLN
1.0000 g | Freq: Once | INTRAVENOUS | Status: AC
  Administered 2015-10-27: 1 g via INTRAVENOUS
  Filled 2015-10-27: qty 10

## 2015-10-27 MED ORDER — SODIUM CHLORIDE 0.9 % IV SOLN: 100.0000 mL | INTRAVENOUS | Status: DC | PRN

## 2015-10-27 MED ORDER — HEPARIN SODIUM (PORCINE) 5000 UNIT/ML IJ SOLN
5000.0000 [IU] | Freq: Three times a day (TID) | INTRAMUSCULAR | Status: DC
  Filled 2015-10-27: qty 1

## 2015-10-27 MED ORDER — POLYETHYLENE GLYCOL 3350 17 G PO PACK
17.0000 g | PACK | Freq: Every day | ORAL | Status: DC
  Filled 2015-10-27: qty 1

## 2015-10-27 MED ORDER — ISOSORBIDE DINITRATE 10 MG PO TABS
10.0000 mg | ORAL_TABLET | Freq: Three times a day (TID) | ORAL | Status: DC
Start: 2015-10-27 — End: 2015-10-29
  Administered 2015-10-28 – 2015-10-29 (×5): 10 mg via ORAL
  Filled 2015-10-27 (×5): qty 1

## 2015-10-27 MED ORDER — CINACALCET HCL 30 MG PO TABS
30.0000 mg | ORAL_TABLET | Freq: Every day | ORAL | Status: DC
  Administered 2015-10-28 – 2015-10-29 (×2): 30 mg via ORAL
  Filled 2015-10-27 (×2): qty 1

## 2015-10-27 MED ORDER — ACETAMINOPHEN 325 MG PO TABS
650.0000 mg | ORAL_TABLET | Freq: Four times a day (QID) | ORAL | Status: DC | PRN
  Administered 2015-10-28: 650 mg via ORAL
  Filled 2015-10-27: qty 2

## 2015-10-27 MED ORDER — INSULIN ASPART 100 UNIT/ML IV SOLN
10.0000 [IU] | Freq: Once | INTRAVENOUS | Status: AC
  Administered 2015-10-27: 10 [IU] via INTRAVENOUS
  Filled 2015-10-27: qty 1

## 2015-10-27 MED ORDER — HYDROMORPHONE HCL 2 MG PO TABS
1.0000 mg | ORAL_TABLET | Freq: Four times a day (QID) | ORAL | Status: DC | PRN
  Administered 2015-10-27 – 2015-10-28 (×2): 1 mg via ORAL
  Filled 2015-10-27: qty 1

## 2015-10-27 MED ORDER — LIDOCAINE HCL (PF) 1 % IJ SOLN: 5.0000 mL | INTRAMUSCULAR | Status: DC | PRN

## 2015-10-27 MED ORDER — SEVELAMER CARBONATE 800 MG PO TABS
2400.0000 mg | ORAL_TABLET | Freq: Three times a day (TID) | ORAL | Status: DC
  Administered 2015-10-28 – 2015-10-29 (×3): 2400 mg via ORAL
  Filled 2015-10-27 (×2): qty 3

## 2015-10-27 MED ORDER — ONDANSETRON HCL 4 MG/2ML IJ SOLN
4.0000 mg | Freq: Four times a day (QID) | INTRAMUSCULAR | Status: DC | PRN
  Administered 2015-10-28: 4 mg via INTRAVENOUS

## 2015-10-27 MED ORDER — OXYCODONE-ACETAMINOPHEN 5-325 MG PO TABS
1.0000 | ORAL_TABLET | Freq: Once | ORAL | Status: AC
  Administered 2015-10-27: 1 via ORAL
  Filled 2015-10-27: qty 1

## 2015-10-27 MED ORDER — ALTEPLASE 2 MG IJ SOLR
2.0000 mg | Freq: Once | INTRAMUSCULAR | Status: DC | PRN
  Filled 2015-10-27: qty 2

## 2015-10-27 MED ORDER — HYDROMORPHONE HCL 2 MG PO TABS
ORAL_TABLET | ORAL | Status: AC
  Administered 2015-10-27: 1 mg via ORAL
  Filled 2015-10-27: qty 1

## 2015-10-27 MED ORDER — LORAZEPAM 1 MG PO TABS: 1.0000 mg | ORAL_TABLET | Freq: Every day | ORAL | Status: DC | PRN

## 2015-10-27 MED ORDER — PENTAFLUOROPROP-TETRAFLUOROETH EX AERO
1.0000 "application " | INHALATION_SPRAY | CUTANEOUS | Status: DC | PRN
  Filled 2015-10-27: qty 30

## 2015-10-27 MED ORDER — DIPHENHYDRAMINE HCL 25 MG PO CAPS
25.0000 mg | ORAL_CAPSULE | Freq: Four times a day (QID) | ORAL | Status: DC | PRN
  Administered 2015-10-27 – 2015-10-28 (×2): 25 mg via ORAL

## 2015-10-27 MED ORDER — CARVEDILOL 6.25 MG PO TABS
6.2500 mg | ORAL_TABLET | Freq: Two times a day (BID) | ORAL | Status: DC
  Administered 2015-10-28 – 2015-10-29 (×3): 6.25 mg via ORAL
  Filled 2015-10-27 (×3): qty 1

## 2015-10-27 MED ORDER — SERTRALINE HCL 100 MG PO TABS
100.0000 mg | ORAL_TABLET | Freq: Every day | ORAL | Status: DC
  Administered 2015-10-28 – 2015-10-29 (×3): 100 mg via ORAL
  Filled 2015-10-27 (×3): qty 1

## 2015-10-27 MED ORDER — ACETAMINOPHEN 650 MG RE SUPP: 650.0000 mg | Freq: Four times a day (QID) | RECTAL | Status: DC | PRN

## 2015-10-27 MED ORDER — NICOTINE 7 MG/24HR TD PT24
7.0000 mg | MEDICATED_PATCH | Freq: Every day | TRANSDERMAL | Status: DC
  Administered 2015-10-28 (×2): 7 mg via TRANSDERMAL
  Filled 2015-10-27 (×3): qty 1

## 2015-10-27 MED ORDER — ONDANSETRON HCL 4 MG PO TABS
4.0000 mg | ORAL_TABLET | Freq: Four times a day (QID) | ORAL | Status: DC | PRN
Start: 2015-10-27 — End: 2015-10-29

## 2015-10-27 MED ORDER — HYDROMORPHONE HCL 2 MG PO TABS
4.0000 mg | ORAL_TABLET | Freq: Once | ORAL | Status: AC
  Administered 2015-10-27: 4 mg via ORAL
  Filled 2015-10-27: qty 2

## 2015-10-27 MED ORDER — LIDOCAINE-PRILOCAINE 2.5-2.5 % EX CREA
1.0000 "application " | TOPICAL_CREAM | CUTANEOUS | Status: DC | PRN
  Filled 2015-10-27: qty 5

## 2015-10-27 NOTE — H&P (Signed)
Family Medicine Teaching [facility] Admission History and Physical Service Pager: [phone]  Patient name: [name] Small Medical record number: [id number] Date of birth: 11/09/1990 Age: 25 y.o. Gender: female  Primary Care Provider: No PCP Per Patient Consultants: Nephrology Code Status: Full  Chief Complaint: Left-sided chest pain  Assessment and Plan: [name] Small is a 25 y.o. female presenting with left sided chest pain. 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  Chest pain: Likely secondary to fluid overload. Pt has associated trace lower extremity edema and abdominal distention. She last received dialysis at [facility] on 12/31.  One possible differential is ACS, although EKG does not show any signs of ischemia and i-stat troponin is 0.07 and 0.05. We also considered PE given her history of PE in the past, but she has an IVC filter in place so this is less likely, and current oxygen sat normal, without tachypnea.  - Admit to telemetry, attending Dr. Lum Babe. - Will trend troponins - Tylenol and Dilaudid  PO q6hrs prn for pain (on home dose oxyIR  q6hrs) - Zofran for nausea - Cardiac monitoring and continuous pulse ox - Vitals per unit routine  ESRD on HD: K 7.1 in the ED. EKG without peaked T waves.   - s/p calcium gluconate, novolog 10 units + D50 in the ED - Nephrology following, appreciate recommendations. Will receive HD tonight. - Renal function panel in the AM - Continue home meds: Cinacalcet, Rena-vit, Renvela. Unclear if she was taking all of these, stated only taking rena-vit - continue home Ativan for anxiety during dialysis - SW consult to help set her up with a dialysis center  Sickle cell anemia: Pt states  she was diagnosed with this 2 years ago. We suspect that she has sickle cell trait, not sickle cell disease.  - AM CBC and retic - clarify in AM how diagnosis was made, can consider hgb electrophoresis  HFrEF secondary to nonischemic cardiomyopathy s/p pacemaker placement: Last ECHO (03/2015): EF 20-25%, severely dilated left ventricle, diffuse hypokinesis, small to moderate pericardial effusion. - Continue home BP meds  Hypertension: BPs ranging from 120-132/84-93 in the ED.  - Continue home BP meds: Norvasc 2.5mg  daily, Coreg 6.25mg  bid, Isosorbide dinitrate  daily  Anemia of chronic disease: Likely secondary to ESRD. Hgb 10.1 in the ED. Last iron panel showed normal iron and high ferritin. - AM CBC  Hx PE s/p IVC filter: - Heparin sq for prophylaxis  Tobacco abuse: - Nicotine patch per patient request  Chronic pain: Takes Oxycodone  at home. - Tylenol and Dilaudid  po for pain  Depression, stable: - Continue home med: Zoloft  daily   FEN/GI: Heart healthy diet, SLIV, Miralax daily Prophylaxis: Heparin sq  Disposition: Admit to FMTS, attending Dr. Lum Babe.  History of Present Illness:  [name] Small is a 25 y.o. female presenting to the ED with left-sided chest pain for the last 2 days. Sitting upright makes the chest pain better. Laying flat and taking a deep breath makes it worse. CP is worse with exertion because she says it makes her short of breath and that causes the pain to be worse. She describes the pain as "sharp" and "tight". She tried taking  Oxycodone q6hrs at home, but it has not helped at all.  She often gets chest pain, but usually on the right side and radiates into her back. The current pain feels different, but thinks maybe she got a similar pain years ago when she had her PE. She was seen at ED several days ago for similar issue and was sent home. She has had shortness of breath and pain going down her right leg as well for 2 days. The shortness  of breath is worse with lying flat.  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 This fracture occurred 6 months ago.  She says she just found out about sickle cell diagnosis 2 years ago.    She does not have a nephrologist, she has been getting dialysis by going to the ED. She used to be with [facility] but was dismissed because she was not compliant/missed a lot of sessions. She states she has a primary doctor at [facility] and has an appointment with that physician this Friday.  In the ED, she had a K of 7.1. EKG showed sinus rhythm with 1st degree AV block. She was given calcium gluconate x 1, Novolog 10 units, and an amp of D50. I-stat troponin was negative at 0.05. CXR showed stable cardiomegaly with no consolidation. Nephrology was consulted and did not recommend any Kayexylate for the hyperkalemia. They stated they would take her to dialysis tonight.   Review Of Systems: Per HPI with the following additions: no fevers, +sweats, no abdominal pain, no nausea/vomiting, no diarrhea. Otherwise the remainder of the systems were negative.  Patient Active Problem List   Diagnosis Date Noted  . Chest pain 10/11/2015  . Fluid overload 10/10/2015  . Hypertension 10/10/2015  . Sickle cell anemia (HCC) 10/04/2015  . Anemia of chronic renal failure, stage 5 (HCC) 10/04/2015  . Thrombocytopenia (HCC) 10/04/2015  . Chest pain of uncertain etiology 10/04/2015  . Chronically Elevated troponin 03/03/2015  . Hyperkalemia 02/21/2015  . Systolic CHF, chronic (HCC) 02/21/2012  . Pulmonary infarct (HCC) 02/07/2012  . ESRD (end stage renal disease) on dialysis (HCC) 02/03/2012  . PE (pulmonary embolism) 02/03/2012    Past Medical History: Past Medical History  Diagnosis Date  . Hemodialysis patient (HCC)   . Hypertension   . Pulmonary emboli (HCC) 01/2012    Bilateral, moderate clot burden, areas of pulmonary infarction and central  necrosis  . CHF (congestive heart failure) (HCC)   . Cardiomyopathy   . Dysrhythmia     at times per pt.  . Anemia   . H/O transfusion of packed red blood cells   . 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 Polycystic kidney disease   . Cellulitis and abscess of face 03/22/2013  . Renal insufficiency   . Sickle cell anemia (HCC)   . Chronic pain   . GERD (gastroesophageal reflux disease)   . Depression   . Hyperkalemia 09/2015  . Pelvic fracture [facility])     Past Surgical History: Past Surgical History  Procedure Laterality Date  . Nephrectomy    . Av fistula placement    . Kidney transplant  2008    failed  . Tonsillectomy      as a child.  . Adenoidectomy    . Incision and drainage abscess Right 03/21/2013    Procedure: INCISION AND DRAINAGE RIGHT CHEEK ABSCESS REMOVAL OF FOREIGN BODY;  Surgeon: Serena Colonel, MD;  Location: [facility] OR;  Service: ENT;  Laterality: Right;  . Implantable cardioverter defibrillator implant Right 12/2014    Social History: Social History  Substance Use Topics  . Smoking status: Current Some Day Smoker -- 1 years    Types: Cigarettes  . Smokeless tobacco: Never Used  . Alcohol Use: No  1 cigarette every few days, decreased this recently.  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  Family History: Family History  Problem Relation Age of Onset  . Polycystic kidney disease Father     Allergies and Medications: Allergies  Allergen Reactions  . Tramadol Anaphylaxis  . Vicodin [Hydrocodone-Acetaminophen] Hives  . Buprenorphine Hcl Itching    Ok with oxycodone  . Iohexol Itching  . Morphine And Related Itching    Ok with oxycodone   No current facility-administered medications on file prior to encounter.   Current Outpatient Prescriptions on File Prior to Encounter  Medication  Sig Dispense Refill  . amLODipine (NORVASC) 10 MG tablet Take 5 mg by mouth daily.    . carvedilol (COREG) 6.25 MG tablet Take 1 tablet (6.25 mg total) by mouth 2 (two) times daily. 60 tablet 0  . cinacalcet (SENSIPAR) 30 MG tablet Take 1 tablet (30 mg total) by mouth daily with supper. (Patient taking differently: Take 30 mg by mouth daily with breakfast. ) 30 tablet 0  . isosorbide dinitrate (ISORDIL) 10 MG tablet Take 1 tablet (10 mg total) by mouth 3 (three) times daily. 90 tablet 0  . LORazepam (ATIVAN) 1 MG tablet Take 1 tablet (1 mg total) by mouth daily as needed for anxiety. Patient states she takes one tablet by mouth every day per patient 20 tablet 0  . oxyCODONE (ROXICODONE) 5 MG immediate release tablet Take 1 tablet (5 mg total) by mouth every 4 (four) hours as needed for severe pain. 5 tablet 0  . sevelamer carbonate (RENVELA) 800 MG tablet Take 3 tablets (2,400 mg total) by mouth 3 (three) times daily with meals. 120 tablet 1  . amLODipine (NORVASC) 2.5 MG tablet Take 1 tablet (2.5 mg total) by mouth at bedtime. (Patient not taking: Reported on 10/26/2015) 30 tablet 3    Objective: BP 123/91 mmHg  Pulse 92  Temp(Src) 98.6 F (37 C) (Oral)  Resp 20  SpO2 100% Exam: General: Tired-appearing young female, laying in bed, in NAD, HD in process Eyes: PERRLA, EOMI, no scleral icterus ENTM: Oropharynx clear, mildly dry mucous membranes Neck: Supple, full ROM Cardiovascular: RRR, no murmurs, 2+ DP pulses Respiratory: CTAB, normal work of breathing Abdomen: +BS, soft, non-tender, mildly distended MSK: Warm and well-perfused, moves all extremities spontaneously, trace edema in the lower extremities Skin: No rashes or lesions Neuro: Awake, alert, answers questions appropriately, CN 2-12 grossly intact, no focal deficits Psych: Appropriate affect, normal behavior  Labs and Imaging: CBC BMET   Recent Labs Lab 10/27/15 1530  WBC 5.6  HGB 10.1*  HCT 32.5*  PLT 164    Recent  Labs Lab 10/27/15 1530  NA 140  K 7.1*  CL 103  CO2 23  BUN 73*  CREATININE 10.23*  GLUCOSE 78  CALCIUM 10.2      Campbell Stall, MD 10/27/2015, 6:49 PM PGY-1, [facility] FPTS Intern pager: [phone], text pages welcome  I have seen and examined the patient. I have read and agree with the above note. My changes are noted in blue.  Tawni Carnes, MD 10/27/2015, 10:26  PM PGY-3, [facility] Family Medicine FPTS Intern Pager: [phone], text pages welcome

## 2015-10-27 NOTE — ED Provider Notes (Signed)
CSN: [id number]     Arrival date & time 10/27/15  1456 History   First MD Initiated Contact with Patient 10/27/15 1730     Chief Complaint  Patient presents with  . Chest Pain     (Consider location/radiation/quality/duration/timing/severity/associated sxs/prior Treatment) HPI Patient with history of renal failure on hemodialysis but does not have a dialysis clinic which she regularly attends. Was last dialyzed on Saturday at [facility]. She presents with 2 days of left-sided chest pain worse with deep breathing. She also has diffuse joint pain. She states she's had some orthopnea and increased dyspnea with exertion. Denies cough or fever. Patient notes that she has had increased facial swelling, abdominal distention and bilateral lower extremity edema. Past Medical History  Diagnosis Date  . Hemodialysis patient (HCC)   . Hypertension   . Pulmonary emboli (HCC) 01/2012    Bilateral, moderate clot burden, areas of pulmonary infarction and central necrosis  . CHF (congestive heart failure) (HCC)   . Cardiomyopathy   . Dysrhythmia     at times per pt.  . Anemia   . H/O transfusion of packed red blood cells   . 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 Polycystic kidney disease   . Cellulitis and abscess of face 03/22/2013  . Renal insufficiency   . Sickle cell anemia (HCC)   . Chronic pain   . GERD (gastroesophageal reflux disease)   . Depression   . Hyperkalemia 09/2015  . Pelvic fracture [facility])    Past Surgical History  Procedure Laterality Date  . Nephrectomy    . Av fistula placement    . Kidney transplant  2008    failed  . Tonsillectomy      as a child.  . Adenoidectomy    . Incision and drainage abscess Right 03/21/2013    Procedure: INCISION AND DRAINAGE RIGHT CHEEK ABSCESS REMOVAL OF FOREIGN BODY;  Surgeon: Serena Colonel, MD;  Location: [facility] OR;  Service: ENT;   Laterality: Right;  . Implantable cardioverter defibrillator implant Right 12/2014   Family History  Problem Relation Age of Onset  . Polycystic kidney disease Father    Social History  Substance Use Topics  . Smoking status: Current Some Day Smoker -- 1 years    Types: Cigarettes  . Smokeless tobacco: Never Used  . Alcohol Use: No   OB History    No data available     Review of Systems  Constitutional: Negative for fever and chills.  Respiratory: Positive for shortness of breath. Negative for cough and wheezing.   Cardiovascular: Positive for chest pain and leg swelling. Negative for palpitations.  Gastrointestinal: Positive for abdominal distention. Negative for nausea and diarrhea.  Musculoskeletal: Positive for arthralgias. Negative for back pain, neck pain and neck stiffness.  Skin: Negative for rash and wound.  Neurological: Negative for dizziness, weakness, light-headedness, numbness and headaches.  All other systems reviewed and are negative.     Allergies  Tramadol; Vicodin; Buprenorphine hcl; Iohexol; and Morphine and related  Home Medications   Prior to Admission medications   Medication Sig Start Date End Date Taking? Authorizing Provider  amLODipine (NORVASC) 10 MG tablet Take 5 mg by mouth daily.   Yes Historical Provider, MD  carvedilol (COREG) 6.25 MG tablet Take 1 tablet (6.25 mg total) by mouth 2 (two) times daily. 09/25/15  Yes Zannie Cove, MD  cinacalcet (SENSIPAR) 30 MG  tablet Take 1 tablet (30 mg total) by mouth daily with supper. Patient taking differently: Take 30 mg by mouth daily with breakfast.  09/25/15  Yes Zannie Cove, MD  isosorbide dinitrate (ISORDIL) 10 MG tablet Take 1 tablet (10 mg total) by mouth 3 (three) times daily. 09/25/15  Yes Zannie Cove, MD  LORazepam (ATIVAN) 1 MG tablet Take 1 tablet (1 mg total) by mouth daily as needed for anxiety. Patient states she takes one tablet by mouth every day per patient 10/12/15  Yes Ripudeep  Jenna Luo, MD  multivitamin (RENA-VIT) TABS tablet Take 1 tablet by mouth daily.   Yes Historical Provider, MD  oxyCODONE (ROXICODONE) 5 MG immediate release tablet Take 1 tablet (5 mg total) by mouth every 4 (four) hours as needed for severe pain. 10/26/15  Yes [name], DO  sevelamer carbonate (RENVELA) 800 MG tablet Take 3 tablets (2,400 mg total) by mouth 3 (three) times daily with meals. 09/25/15 09/24/16 Yes Zannie Cove, MD  amLODipine (NORVASC) 2.5 MG tablet Take 1 tablet (2.5 mg total) by mouth at bedtime. Patient not taking: Reported on 10/26/2015 10/12/15   Ripudeep K Rai, MD   BP 120/84 mmHg  Pulse 92  Temp(Src) 98.6 F (37 C) (Oral)  Resp 20  SpO2 100% Physical Exam  Constitutional: She is oriented to person, place, and time. She appears well-developed and well-nourished. No distress.  HENT:  Head: Normocephalic and atraumatic.  Mouth/Throat: Oropharynx is clear and moist.  Diffuse facial swelling. Oropharynx is clear.  Eyes: EOM are normal. Pupils are equal, round, and reactive to light.  Neck: Normal range of motion. Neck supple. No JVD present.  Cardiovascular: Normal rate and regular rhythm.  Exam reveals no gallop and no friction rub.   No murmur heard. Pulmonary/Chest: Effort normal. No respiratory distress. She has no wheezes. She has rales. She exhibits no tenderness.  Few scattered Rales bilateral bases.  Abdominal: Soft. Bowel sounds are normal. She exhibits distension. She exhibits no mass. There is no tenderness. There is no rebound and no guarding.  Musculoskeletal: Normal range of motion. She exhibits edema. She exhibits no tenderness.  Bilateral lower extremity edema which is symmetric. There is no calf tenderness. Distal pulses intact. Patient has left upper extremity fistula. Palpable thrill. No redness or warmth.  Neurological: She is alert and oriented to person, place, and time.  Moving all extremities without deficit. Sensation is fully intact.  Skin: Skin is  warm and dry. No rash noted. No erythema.  Psychiatric: She has a normal mood and affect. Her behavior is normal.  Nursing note and vitals reviewed.   ED Course  Procedures (including critical care time) Labs Review Labs Reviewed  BASIC METABOLIC PANEL - Abnormal; Notable for the following:    Potassium 7.1 (*)    BUN 73 (*)    Creatinine, Ser 10.23 (*)    GFR calc non Af Amer 5 (*)    GFR calc Af Amer 5 (*)    All other components within normal limits  CBC - Abnormal; Notable for the following:    RBC 3.56 (*)    Hemoglobin 10.1 (*)    HCT 32.5 (*)    RDW 17.8 (*)    All other components within normal limits  I-STAT BETA HCG BLOOD, ED ([facility], [facility], [facility] ONLY) - Abnormal; Notable for the following:    I-stat hCG, quantitative 26.1 (*)    All other components within normal limits  I-STAT TROPOININ, ED    Imaging  Review Dg Chest 2 View  10/27/2015  CLINICAL DATA:  Left-sided chest pain.  History of cardiomyopathy. EXAM: CHEST  2 VIEW COMPARISON:  October 26, 2015 FINDINGS: There is slight scarring in the left base. The lungs elsewhere clear. Cardiomegaly is stable. Pacemaker lead tip is attached to the right ventricle. No adenopathy. Pulmonary vascularity appears normal. No bone lesions. There is a filter in the inferior vena cava with the apex at the level of L2. IMPRESSION: Stable cardiomegaly.  No edema or consolidation. Electronically Signed   By: Bretta Bang III M.D.   On: 10/27/2015 15:44   Dg Chest 2 View  10/26/2015  CLINICAL DATA:  Acute onset of generalized chest pain and shortness of breath. Initial encounter. EXAM: CHEST  2 VIEW COMPARISON:  Chest radiograph performed 10/20/2015, and CTA of the chest performed 10/15/2015 FINDINGS: The lungs are well-aerated and clear. There is no evidence of focal opacification, pleural effusion or pneumothorax. The heart is enlarged. An AICD is noted at the right chest wall, with a lead ending at the right ventricle. No acute osseous  abnormalities are seen. IMPRESSION: 1. No acute cardiopulmonary process seen. 2. Cardiomegaly noted. Electronically Signed   By: Roanna Raider M.D.   On: 10/26/2015 03:11   Ct Renal Stone Study  10/26/2015  CLINICAL DATA:  Acute onset of left flank pain.  Initial encounter. EXAM: CT ABDOMEN AND PELVIS WITHOUT CONTRAST TECHNIQUE: Multidetector CT imaging of the abdomen and pelvis was performed following the standard protocol without IV contrast. COMPARISON:  Abdominal ultrasound performed 02/24/2015, and CT of the abdomen and pelvis from 02/03/2012 FINDINGS: A 1.5 cm calcified opacity at the left lung base is likely benign, given the patient's age. The heart is enlarged.  A pacemaker lead is partially imaged. Mild-to-moderate abdominopelvic ascites is noted. The liver and spleen are unremarkable in appearance. Trace fluid about the gallbladder is nonspecific in the presence of ascites. The pancreas and adrenal glands are unremarkable. Scattered hypodense and hyperdense foci within both kidneys likely reflect cysts. Scattered calcification is noted about both kidneys. Mild bilateral renal atrophy is noted. No perinephric stranding is appreciated. No free fluid is identified. The small bowel is unremarkable in appearance. The stomach is within normal limits. No acute vascular abnormalities are seen. An IVC filter is noted in expected position. Scattered vascular calcifications are seen. The appendix is not definitely characterized; there is no evidence for appendicitis. The colon is grossly unremarkable in appearance. The bladder is relatively decompressed and not well assessed. The uterus is grossly unremarkable in appearance. The ovaries are relatively symmetric. No suspicious adnexal masses are seen. No inguinal lymphadenopathy is seen. No acute osseous abnormalities are identified. Degenerative resorption and fragments are noted at the right side of the pubic symphysis. Chronic resorption is noted at the  sacroiliac joints bilaterally. IMPRESSION: 1. Mild to moderate volume abdominopelvic ascites noted. 2. Cardiomegaly noted. 3. Mild bilateral renal atrophy, with scattered hypodense and hyperdense foci in both kidneys. 4. Scattered vascular calcifications seen. 5. Chronic resorption at the sacroiliac joints bilaterally, and degenerative resorption and fragments at the right side of the pubic symphysis. Electronically Signed   By: Roanna Raider M.D.   On: 10/26/2015 04:55   I have personally reviewed and evaluated these images and lab results as part of my medical decision-making.   EKG Interpretation   Date/Time:  Wednesday October 27 2015 14:58:15 EST Ventricular Rate:  90 PR Interval:  282 QRS Duration: 86 QT Interval:  370 QTC Calculation: 452  R Axis:   107 Text Interpretation:  Sinus rhythm with 1st degree A-V block Rightward  axis T wave abnormality, consider inferolateral ischemia Abnormal ECG  Confirmed by Florette Thai  MD, Lashaun Poch ([id number]) on 10/27/2015 5:31:45 PM      MDM   Final diagnoses:  Hyperkalemia  Chest pain, unspecified chest pain type    She was significantly elevated potassium. No EKG changes. We'll give calcium, insulin and glucose in the emergency department. Discussed with nephrology. No Kayexalate. Will dialyze.  Patient with new onset chest pain. Has had a history of PE in the past. Patient does have a inferior venal caval filter in place. Given elevation in potassium this is the more pressing emergent matter. Lower suspicion for PE.  Discussed with Dr. Nancy Marus family medicine resident. Will admit to telemetry bed under Dr. Hansel Feinstein, MD 10/27/15 [phone]

## 2015-10-27 NOTE — Consult Note (Addendum)
Reason for Consult: Hyperkalemia and ESRD Referring Physician: Dr. Tawanna Sat    Chief Complaint: Chest Pain  Assessment/Plan: 1 Hyperkalemia - Protocol already given in the ED. No Kayexalate as we are going to dialyze her tonight on a 1K bath first hour and then 2K. Will reassess in the AM. 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 HD per protocol while in the hospital and will challenge EDW to establish.  3 Hypertension: Well controlled. 4. Anemia of ESRD: @ goal. 5. Metabolic Bone Disease: Will check a phos and adjust binders as needed. 6. Chest pain - atypical (worse with deep inspiration) and also non exertional but certainly concerning with h/o PE. 7. H/o PE - no longer on anticoaguation. 8. PCKD (paternal) - no h/o sudden death or ruptured cerebral aneurysm.  Dialyzes at NO UNIT Access Left BBF  HPI: [name] Small is a 25 y.o. female  Who was last dialyzed on Saturday at [facility]; she currently does not have a center to go to but is trying to arrange admission to a [place] unit. She is presenting with chest pain described as throbbing coming on while she was watching tv. According to the pt this pain has been fairly constant, non exertional, worse with deep inspiration with no radiation. She also has a cough productive of yellowish sputum but denies any fever, chills, diarrhea, abdominal pain or headaches. She also denies any myalgias or arthralgias, syncopal episodes. She has been on dialysis for ~8years currently through a left upper arm BBF.    Past Medical History  Diagnosis Date  . Hemodialysis patient ([place])   . Hypertension   . Pulmonary emboli (HCC) 01/2012    Bilateral, moderate clot burden, areas of pulmonary infarction and central necrosis  . CHF (congestive heart failure) ([place])   . Cardiomyopathy   . Dysrhythmia     at times per pt.  . Anemia   . H/O transfusion of packed red  blood cells   . 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 Polycystic kidney disease   . Cellulitis and abscess of face 03/22/2013  . Renal insufficiency   . Sickle cell anemia (HCC)   . Chronic pain   . GERD (gastroesophageal reflux disease)   . Depression   . Hyperkalemia 09/2015  . Pelvic fracture (HCC)    Allergies:  Allergies  Allergen Reactions  . Tramadol Anaphylaxis  . Vicodin [Hydrocodone-Acetaminophen] Hives  . Buprenorphine Hcl Itching    Ok with oxycodone  . Iohexol Itching  . Morphine And Related Itching    Ok with oxycodone    Past Surgical History  Procedure Laterality Date  . Nephrectomy    . Av fistula placement    . Kidney transplant  2008    failed  . Tonsillectomy      as a child.  . Adenoidectomy    . Incision and drainage abscess Right 03/21/2013    Procedure: INCISION AND DRAINAGE RIGHT CHEEK ABSCESS REMOVAL OF FOREIGN BODY;  Surgeon: Izora Gala, MD;  Location: [facility];  Service: ENT;  Laterality: Right;  . Implantable cardioverter defibrillator implant Right 12/2014    Family History  Problem Relation Age of Onset  . Polycystic kidney disease Father     Social History:  reports that she has been smoking Cigarettes.  She has  smoked for the past 1 year. She has never used smokeless tobacco. She reports that she does not drink alcohol or use illicit drugs.  Review of Systems  Constitutional: Negative for fever, chills, weight loss and diaphoresis.  HENT: Negative for sore throat.   Eyes: Negative for double vision.  Respiratory: Positive for cough and sputum production. Negative for hemoptysis, shortness of breath and wheezing.   Cardiovascular: Positive for chest pain, orthopnea and leg swelling.  Gastrointestinal: Negative for nausea, vomiting and abdominal pain.  Genitourinary: Negative for dysuria and urgency.  Musculoskeletal: Negative for  myalgias, joint pain and neck pain.  Skin: Negative for rash.  Neurological: Positive for weakness. Negative for dizziness, seizures and headaches.  Endo/Heme/Allergies: Negative for polydipsia.  Psychiatric/Behavioral: Negative for suicidal ideas.   Pertinent items are noted in HPI.  Blood pressure 120/84, pulse 92, temperature 98.6 F (37 C), temperature source Oral, resp. rate 20, SpO2 100 %. General appearance: alert and appears older than stated age Head: Normocephalic, without obvious abnormality, atraumatic Eyes: negative Throat: lips, mucosa, and tongue normal; teeth and gums normal Neck: no adenopathy, no carotid bruit, no JVD, supple, symmetrical, trachea midline and thyroid not enlarged, symmetric, no tenderness/mass/nodules Back: symmetric, no curvature. ROM normal. No CVA tenderness. Resp: clear to auscultation bilaterally Chest wall: no tenderness Cardio: S3 present GI: soft, non-tender; bowel sounds normal; no masses,  no organomegaly Extremities: edema 1-2+ lower extremity Pulses: 2+ and symmetric Skin: Skin color, texture, turgor normal. No rashes or lesions Lymph nodes: Cervical, supraclavicular, and axillary nodes normal. Neurologic: Alert and oriented X 3, normal strength and tone. Normal symmetric reflexes. Normal coordination and gait  Results for orders placed or performed during the hospital encounter of 10/27/15 (from the past 48 hour(s))  Basic metabolic panel     Status: Abnormal   Collection Time: 10/27/15  3:30 PM  Result Value Ref Range   Sodium 140 135 - 145 mmol/L   Potassium 7.1 (HH) 3.5 - 5.1 mmol/L    Comment: CRITICAL RESULT CALLED TO, READ BACK BY AND VERIFIED WITH: A RORBY,RN 1700 10/27/2015 WBOND    Chloride 103 101 - 111 mmol/L   CO2 23 22 - 32 mmol/L   Glucose, Bld 78 65 - 99 mg/dL   BUN 73 (H) 6 - 20 mg/dL   Creatinine, Ser 10.23 (H) 0.44 - 1.00 mg/dL   Calcium 10.2 8.9 - 10.3 mg/dL   GFR calc non Af Amer 5 (L) >60 mL/min   GFR calc  Af Amer 5 (L) >60 mL/min    Comment: (NOTE) The eGFR has been calculated using the CKD EPI equation. This calculation has not been validated in all clinical situations. eGFR's persistently <60 mL/min signify possible Chronic Kidney Disease.    Anion gap 14 5 - 15  CBC     Status: Abnormal   Collection Time: 10/27/15  3:30 PM  Result Value Ref Range   WBC 5.6 4.0 - 10.5 K/uL   RBC 3.56 (L) 3.87 - 5.11 MIL/uL   Hemoglobin 10.1 (L) 12.0 - 15.0 g/dL   HCT 32.5 (L) 36.0 - 46.0 %   MCV 91.3 78.0 - 100.0 fL   MCH 28.4 26.0 - 34.0 pg   MCHC 31.1 30.0 - 36.0 g/dL   RDW 17.8 (H) 11.5 - 15.5 %   Platelets 164 150 - 400 K/uL  I-stat troponin, ED (not at [facility], [facility])     Status: None   Collection Time: 10/27/15  3:47 PM  Result Value Ref  Range   Troponin i, poc 0.05 0.00 - 0.08 ng/mL   Comment 3            Comment: Due to the release kinetics of cTnI, a negative result within the first hours of the onset of symptoms does not rule out myocardial infarction with certainty. If myocardial infarction is still suspected, repeat the test at appropriate intervals.   I-Stat Beta hCG blood, ED ([facility], [facility], [facility] only)     Status: Abnormal   Collection Time: 10/27/15  5:52 PM  Result Value Ref Range   I-stat hCG, quantitative 26.1 (H) <5 mIU/mL   Comment 3            Comment:   GEST. AGE      CONC.  (mIU/mL)   <=1 WEEK        5 - 50     2 WEEKS       50 - 500     3 WEEKS       100 - 10,000     4 WEEKS     1,000 - 30,000        FEMALE AND NON-PREGNANT FEMALE:     LESS THAN 5 mIU/mL     Dg Chest 2 View  10/27/2015  CLINICAL DATA:  Left-sided chest pain.  History of cardiomyopathy. EXAM: CHEST  2 VIEW COMPARISON:  October 26, 2015 FINDINGS: There is slight scarring in the left base. The lungs elsewhere clear. Cardiomegaly is stable. Pacemaker lead tip is attached to the right ventricle. No adenopathy. Pulmonary vascularity appears normal. No bone lesions. There is a filter in the inferior vena cava with the  apex at the level of L2. IMPRESSION: Stable cardiomegaly.  No edema or consolidation. Electronically Signed   By: Lowella Grip III M.D.   On: 10/27/2015 15:44   Dg Chest 2 View  10/26/2015  CLINICAL DATA:  Acute onset of generalized chest pain and shortness of breath. Initial encounter. EXAM: CHEST  2 VIEW COMPARISON:  Chest radiograph performed 10/20/2015, and CTA of the chest performed 10/15/2015 FINDINGS: The lungs are well-aerated and clear. There is no evidence of focal opacification, pleural effusion or pneumothorax. The heart is enlarged. An AICD is noted at the right chest wall, with a lead ending at the right ventricle. No acute osseous abnormalities are seen. IMPRESSION: 1. No acute cardiopulmonary process seen. 2. Cardiomegaly noted. Electronically Signed   By: Garald Balding M.D.   On: 10/26/2015 03:11   Ct Renal Stone Study  10/26/2015  CLINICAL DATA:  Acute onset of left flank pain.  Initial encounter. EXAM: CT ABDOMEN AND PELVIS WITHOUT CONTRAST TECHNIQUE: Multidetector CT imaging of the abdomen and pelvis was performed following the standard protocol without IV contrast. COMPARISON:  Abdominal ultrasound performed 02/24/2015, and CT of the abdomen and pelvis from 02/03/2012 FINDINGS: A 1.5 cm calcified opacity at the left lung base is likely benign, given the patient's age. The heart is enlarged.  A pacemaker lead is partially imaged. Mild-to-moderate abdominopelvic ascites is noted. The liver and spleen are unremarkable in appearance. Trace fluid about the gallbladder is nonspecific in the presence of ascites. The pancreas and adrenal glands are unremarkable. Scattered hypodense and hyperdense foci within both kidneys likely reflect cysts. Scattered calcification is noted about both kidneys. Mild bilateral renal atrophy is noted. No perinephric stranding is appreciated. No free fluid is identified. The small bowel is unremarkable in appearance. The stomach is within normal limits. No acute  vascular abnormalities  are seen. An IVC filter is noted in expected position. Scattered vascular calcifications are seen. The appendix is not definitely characterized; there is no evidence for appendicitis. The colon is grossly unremarkable in appearance. The bladder is relatively decompressed and not well assessed. The uterus is grossly unremarkable in appearance. The ovaries are relatively symmetric. No suspicious adnexal masses are seen. No inguinal lymphadenopathy is seen. No acute osseous abnormalities are identified. Degenerative resorption and fragments are noted at the right side of the pubic symphysis. Chronic resorption is noted at the sacroiliac joints bilaterally. IMPRESSION: 1. Mild to moderate volume abdominopelvic ascites noted. 2. Cardiomegaly noted. 3. Mild bilateral renal atrophy, with scattered hypodense and hyperdense foci in both kidneys. 4. Scattered vascular calcifications seen. 5. Chronic resorption at the sacroiliac joints bilaterally, and degenerative resorption and fragments at the right side of the pubic symphysis. Electronically Signed   By: Garald Balding M.D.   On: 10/26/2015 04:55    Seen on dialysis at 840pm on 10/27/2015. Dialyzing through a left BBF Qb 450/ Qd 800 UF 4.5 liters AP/VP -170/210 2K 2Ca  Patient stable with no complaints, tolerating the procedure well. Just received PO Dilaudid for the atypical chest pain which she describes as no worse. She appears to be comfortable. Dwana Melena, MD 10/27/2015, 6:11 PM

## 2015-10-27 NOTE — ED Notes (Signed)
Dr Ranae Palms aware of pt elevated K+,pt roomed in ED immediately

## 2015-10-27 NOTE — ED Notes (Signed)
Pt c/o chest pain starting yesterday. Also c/o bone pains r/t her chronic bone disease. Was seen at [facility] for the same. States they said it could be fluid around her heart but she didn't get dialysis. Last dialysis was Saturday. States that she does not have a dialysis center and gets dialysis "whenever I can."

## 2015-10-28 DIAGNOSIS — R079 Chest pain, unspecified: Secondary | ICD-10-CM

## 2015-10-28 DIAGNOSIS — E349 Endocrine disorder, unspecified: Secondary | ICD-10-CM | POA: Insufficient documentation

## 2015-10-28 DIAGNOSIS — R000 Tachycardia, unspecified: Secondary | ICD-10-CM | POA: Insufficient documentation

## 2015-10-28 DIAGNOSIS — E875 Hyperkalemia: Secondary | ICD-10-CM

## 2015-10-28 DIAGNOSIS — Z992 Dependence on renal dialysis: Secondary | ICD-10-CM

## 2015-10-28 DIAGNOSIS — N186 End stage renal disease: Secondary | ICD-10-CM

## 2015-10-28 LAB — RENAL FUNCTION PANEL
ALBUMIN: 3.3 g/dL — AB (ref 3.5–5.0)
ALBUMIN: 3.3 g/dL — AB (ref 3.5–5.0)
Anion gap: 11 (ref 5–15)
Anion gap: 12 (ref 5–15)
BUN: 19 mg/dL (ref 6–20)
BUN: 11 mg/dL (ref 6–20)
CHLORIDE: 98 mmol/L — AB (ref 101–111)
CO2: 29 mmol/L (ref 22–32)
CO2: 26 mmol/L (ref 22–32)
CREATININE: 4.69 mg/dL — AB (ref 0.44–1.00)
Calcium: 10.1 mg/dL (ref 8.9–10.3)
Calcium: 9.1 mg/dL (ref 8.9–10.3)
Chloride: 99 mmol/L — ABNORMAL LOW (ref 101–111)
Creatinine, Ser: 2.87 mg/dL — ABNORMAL HIGH (ref 0.44–1.00)
GFR calc Af Amer: 25 mL/min — ABNORMAL LOW (ref >60)
GFR calc non Af Amer: 22 mL/min — ABNORMAL LOW (ref >60)
GFR, EST AFRICAN AMERICAN: 14 mL/min — AB (ref >60)
GFR, EST NON AFRICAN AMERICAN: 12 mL/min — AB (ref >60)
GLUCOSE: 87 mg/dL (ref 65–99)
Glucose, Bld: 91 mg/dL (ref 65–99)
PHOSPHORUS: 5.7 mg/dL — AB (ref 2.5–4.6)
POTASSIUM: 4.4 mmol/L (ref 3.5–5.1)
POTASSIUM: 4.1 mmol/L (ref 3.5–5.1)
Phosphorus: 3.6 mg/dL (ref 2.5–4.6)
Sodium: 139 mmol/L (ref 135–145)
Sodium: 136 mmol/L (ref 135–145)

## 2015-10-28 LAB — CBC
HEMATOCRIT: 31.3 % — AB (ref 36.0–46.0)
Hemoglobin: 9.6 g/dL — ABNORMAL LOW (ref 12.0–15.0)
MCH: 28 pg (ref 26.0–34.0)
MCHC: 30.7 g/dL (ref 30.0–36.0)
MCV: 91.3 fL (ref 78.0–100.0)
Platelets: 147 10*3/uL — ABNORMAL LOW (ref 150–400)
RBC: 3.43 MIL/uL — AB (ref 3.87–5.11)
RDW: 17.8 % — AB (ref 11.5–15.5)
WBC: 5 10*3/uL (ref 4.0–10.5)

## 2015-10-28 LAB — RETICULOCYTES
RBC.: 3.2 MIL/uL — ABNORMAL LOW (ref 3.87–5.11)
RETIC COUNT ABSOLUTE: 35.2 10*3/uL (ref 19.0–186.0)
Retic Ct Pct: 1.1 % (ref 0.4–3.1)

## 2015-10-28 LAB — TROPONIN I
TROPONIN I: 0.09 ng/mL — AB (ref <0.031)
Troponin I: 0.08 ng/mL — ABNORMAL HIGH (ref <0.031)
Troponin I: 0.09 ng/mL — ABNORMAL HIGH (ref <0.031)

## 2015-10-28 LAB — MRSA PCR SCREENING: MRSA BY PCR: NEGATIVE

## 2015-10-28 LAB — HCG, SERUM, QUALITATIVE: Preg, Serum: NEGATIVE

## 2015-10-28 MED ORDER — HEPARIN SODIUM (PORCINE) 1000 UNIT/ML DIALYSIS: 40.0000 [IU]/kg | INTRAMUSCULAR | Status: DC | PRN

## 2015-10-28 MED ORDER — DARBEPOETIN ALFA 60 MCG/0.3ML IJ SOSY
60.0000 ug | PREFILLED_SYRINGE | INTRAMUSCULAR | Status: DC
  Administered 2015-10-28: 60 ug via INTRAVENOUS
  Filled 2015-10-28: qty 0.3

## 2015-10-28 MED ORDER — DARBEPOETIN ALFA 60 MCG/0.3ML IJ SOSY
PREFILLED_SYRINGE | INTRAMUSCULAR | Status: AC
Start: 2015-10-28 — End: 2015-10-28
  Filled 2015-10-28: qty 0.3

## 2015-10-28 MED ORDER — OXYCODONE HCL 5 MG PO TABS
5.0000 mg | ORAL_TABLET | ORAL | Status: DC | PRN
  Administered 2015-10-28 – 2015-10-29 (×5): 5 mg via ORAL
  Filled 2015-10-28 (×4): qty 1

## 2015-10-28 MED ORDER — ONDANSETRON HCL 4 MG/2ML IJ SOLN
INTRAMUSCULAR | Status: AC
  Filled 2015-10-28: qty 2

## 2015-10-28 MED ORDER — DIPHENHYDRAMINE HCL 25 MG PO CAPS
ORAL_CAPSULE | ORAL | Status: AC
  Filled 2015-10-28: qty 1

## 2015-10-28 NOTE — Progress Notes (Signed)
Patient ID: [name] Small, female   DOB: 08/30/1991, 25 y.o.   MRN: [id number]  [facility] Progress Note   Assessment/ Plan:   1. Critical hyperkalemia: Corrected with emergent hemodialysis overnight-no critical arrhythmias noted overnight 2. ESRD: Hemodialysis to be done again today for clearance/volume unloading. Awaiting placement to outpatient dialysis unit apparently in [place] by her report. She lives in [place] with her parents. 3. Volume overload: With evidence of volume excess as manifest by pedal edema-attempt ultrafiltration with hemodialysis and reeducated her regarding fluid restriction/management of interdialytic weight gain 4. Secondary hyperparathyroidism: Elevated phosphorus levels noted, binders resumed and remains on cinacalcet for elevated PTH. 5. Anemia: Low hemoglobin levels, will give a dose of Aranesp today.  Subjective:   Reports to be breathing better and denies any chest pain-amenable to dialysis again today. She states that she is expecting a phone call later today regarding placement to a dialysis center in [place], [place].    Objective:   BP 112/71 mmHg  Pulse 92  Temp(Src) 98 F (36.7 C) (Oral)  Resp 17  Wt 57 kg (125 lb 10.6 oz)  SpO2 98%  Physical Exam: Gen: Appears to be comfortable resting in bed CVS: Pulse regular in rate and rhythm, S1 and S2 normal Resp: Fine rales left base otherwise clear to auscultation Abd: Soft, flat, nontender bowel sounds normal Ext: 1-2+ lower extremity edema, left brachiocephalic fistula-aneurysmal but not pulsatile  Labs: BMET  Recent Labs Lab 10/26/15 0135 10/26/15 0152 10/27/15 1530 10/28/15 0506  NA 143 142 140 139  K 5.2* 5.1 7.1* 4.4  CL 102 103 103 99*  CO2 26  --  23 29  GLUCOSE 95 91 78 91  BUN 62* 61* 73* 19  CREATININE 8.46* 8.20* 10.23* 4.69*  CALCIUM 10.4*  --  10.2 10.1  PHOS  --   --   --  5.7*   CBC  Recent Labs Lab 10/26/15 0135 10/26/15 0152 10/27/15 1530  10/28/15 0506  WBC 7.3  --  5.6 5.0  HGB 10.5* 12.9 10.1* 9.6*  HCT 33.4* 38.0 32.5* 31.3*  MCV 93.0  --  91.3 91.3  PLT 174  --  164 147*   Medications:    . amLODipine  2.5 mg Oral QHS  . carvedilol  6.25 mg Oral BID WC  . cinacalcet  30 mg Oral Q breakfast  . heparin  2,000 Units Dialysis Once in dialysis  . heparin  5,000 Units Subcutaneous 3 times per day  . isosorbide dinitrate  10 mg Oral TID  . multivitamin  1 tablet Oral QHS  . nicotine  7 mg Transdermal Daily  . polyethylene glycol  17 g Oral Daily  . sertraline  100 mg Oral Daily  . sevelamer carbonate  2,400 mg Oral TID WC   Zetta Bills, MD 10/28/2015, 11:00 AM

## 2015-10-28 NOTE — Progress Notes (Signed)
New Admission Note:   Arrival: Patient transferred from hemodialysis. Mental Orientation: A&Ox4 Telemetry: Placed on box 6e08 Assessment:  See doc flowsheet Skin: Intact IV: Right forearm IV  Pain: 8/10 pain, notified MD on-call, order placed for dilaudid 4 mg po Safety Measures:  Call bell placed within reach; patient instructed on use of call bell and verbalized understanding. Bed in lowest position.   [address] [place] Orientation: Patient oriented to staff, room, and unit. Family: None at bedside  Orders have been reviewed and implemented. Will continue to monitor.  Rozann Lesches, RN, BSN

## 2015-10-28 NOTE — Procedures (Signed)
Patient seen on Hemodialysis. QB 400, UF goal 3L Treatment adjusted as needed.  Zetta Bills MD [facility]. Office # [phone] Pager # [phone] 1:06 PM

## 2015-10-28 NOTE — Progress Notes (Signed)
Patient stating she has 8/10 pain.  Requesting to see if she can have 10 mg oxycodone PO, instead of her ordered 5 mg PRN dose.  Notified MD on-call.  MD stated that patient can try her 5 mg oxycodone PO with tylenol 650 mg PRN tonight instead.  Patient made aware and agreed.  Medication administered.  Will continue to monitor.

## 2015-10-28 NOTE — Progress Notes (Signed)
Family Medicine Teaching Service Daily Progress Note Intern Pager: [phone]  Patient name: [name] Small Medical record number: [id number] Date of birth: [birth year] Age: 25 y.o. Gender: female  Primary Care Provider: No PCP Per Patient Consultants: nephrology Code Status: full  Pt Overview and Major Events to Date:  1/4 admitted with chest pain  Assessment and Plan: Theo Pollins is a 25 y.o. female presenting with left sided chest pain. 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  Chest pain: non-anginal. Concern for fluid overload with facial edema, JVD and hepatojugular reflux. However, no shortness of breath  or crackles. She last received dialysis at [facility] on 12/31.EKG with TW changes, RAD and 1 deg AVB (unchanged from baseline). POC troponin 0.07 and 0.05 but this has been the best she had.  IVC filter in place so PE is less likely, and current oxygen sat normal, without tachypnea. Recent admission with similar pain. At that time she was asking for scheduled pain medications per note in her chart. Received Dilaudid and percocet this admission.  - Admit to telemetry, attending Dr. Lum Babe. - cycle troponins 0.08>>0.09 (below baseline) - Tylenol as needed. Will consider restarting her home pain meds - Zofran for nausea - Cardiac monitoring and continuous pulse ox - Echo today given chest pain, JVD and hepatojugular reflux and previous finding of pericardial effusion. - Vitals per unit routine  Hyper K:  Resolved. K 7.1 in the ED. EKG without peaked T waves.K 4.4 this am - s/p calcium gluconate, novolog 10 units + D50 in the ED - Nephrology following. Received HD last night.  ESRD on HD:   - Nephrology following. Received HD  last night. - Renal function panel - Continue home meds: Cinacalcet, Rena-vit, Renvela. Unclear if she was taking all of these, stated only taking rena-vit - continue home Ativan for anxiety during dialysis - SW consult to help set her up with a dialysis center  Sickle cell anemia: Pt states she was diagnosed with this 2 years ago. We suspect that she has sickle cell trait, not sickle cell disease.  -follow up on hemoglobinopathy labs  HFrEF secondary to nonischemic cardiomyopathy s/p pacemaker placement: Last ECHO (03/2015): EF 20-25%, severely dilated left ventricle, diffuse hypokinesis, small to moderate pericardial effusion. Exam with JUD and hepatojugular reflux - Echo today - Continue home BP meds  I-stat Beta HCG 27 on admission: not unusual in patients with ESRD. -repeat serum bHCG today  Hypertension: BPs ranging from 120-132/84-93 in the ED.  - Continue home BP meds: Norvasc 2.5mg  daily, Coreg 6.25mg  bid, Isosorbide dinitrate 10mg  daily  Anemia of chronic disease: Likely secondary to ESRD. Hgb 10.1 in the ED (at baseline). Last iron panel showed normal iron and high ferritin. - AM CBC  Hx PE s/p IVC filter: - Heparin sq for prophylaxis  Tobacco abuse: - Nicotine patch per patient request  Chronic pain: Takes Oxycodone 10mg  at home. - Tylenol and Dilaudid 1mg  po for pain  Depression, stable: - Continue home med: Zoloft 100mg  daily  FEN/GI: Heart healthy diet, SLIV, Miralax daily  Prophylaxis: Heparin sq  Disposition: Admit to FMTS, attending Dr. Lum Babe.  Disposition: floor pending echo, bHCG and improvement in chest pain. SW for outpt HD (likely going to [place]).  Subjective:  Reports sharp chest  pain on left side. No radiation. Non-exertional. No radiation. Denies shortness of breath with ambulation. She says swelling has improved. Denies cough or fever  Objective: Temp:  [97.9 F (36.6 C)-99.1 F (37.3 C)] 97.9 F (36.6 C) (01/05 0550) Pulse Rate:   [90-103] 103 (01/05 0550) Resp:  [16-31] 16 (01/05 0550) BP: (94-140)/(77-107) 110/77 mmHg (01/05 0550) SpO2:  [100 %] 100 % (01/05 0550) Weight:  [125 lb 10.6 oz (57 kg)-132 lb 11.5 oz (60.2 kg)] 125 lb 10.6 oz (57 kg) (01/04 2320) Physical Exam: Gen: appears well, face appears puffy Oropharynx: clear, moist CV: regular rate and rythm. S1 & S2 audible Resp: no apparent work of breathing, clear to auscultation bilaterally. GI: bowel sounds normal, no tenderness to palpation, no rebound or guarding, no mass.  Skin: no lesion MSK: no ext edema  Laboratory:  Recent Labs Lab 10/26/15 0135 10/26/15 0152 10/27/15 1530 10/28/15 0506  WBC 7.3  --  5.6 5.0  HGB 10.5* 12.9 10.1* 9.6*  HCT 33.4* 38.0 32.5* 31.3*  PLT 174  --  164 147*    Recent Labs Lab 10/26/15 0135 10/26/15 0136 10/26/15 0152 10/27/15 1530 10/28/15 0506  NA 143  --  142 140 139  K 5.2*  --  5.1 7.1* 4.4  CL 102  --  103 103 99*  CO2 26  --   --  23 29  BUN 62*  --  61* 73* 19  CREATININE 8.46*  --  8.20* 10.23* 4.69*  CALCIUM 10.4*  --   --  10.2 10.1  PROT  --  8.4*  --   --   --   BILITOT  --  1.0  --   --   --   ALKPHOS  --  450*  --   --   --   ALT  --  12*  --   --   --   AST  --  23  --   --   --   GLUCOSE 95  --  91 78 91    Imaging/Diagnostic Tests: Dg Chest 2 View  10/27/2015  CLINICAL DATA:  Left-sided chest pain.  History of cardiomyopathy. EXAM: CHEST  2 VIEW COMPARISON:  October 26, 2015 FINDINGS: There is slight scarring in the left base. The lungs elsewhere clear. Cardiomegaly is stable. Pacemaker lead tip is attached to the right ventricle. No adenopathy. Pulmonary vascularity appears normal. No bone lesions. There is a filter in the inferior vena cava with the apex at the level of L2. IMPRESSION: Stable cardiomegaly.  No edema or consolidation. Electronically Signed   By: Bretta Bang III M.D.   On: 10/27/2015 15:44    Almon Hercules, MD 10/28/2015, 7:26 AM PGY-1, [place] [facility] FPTS Intern pager: [phone], text pages welcome

## 2015-10-29 ENCOUNTER — Inpatient Hospital Stay (HOSPITAL_COMMUNITY): Payer: Medicare Other

## 2015-10-29 DIAGNOSIS — R079 Chest pain, unspecified: Secondary | ICD-10-CM

## 2015-10-29 MED ORDER — OXYCODONE HCL 5 MG PO TABS
ORAL_TABLET | ORAL | Status: AC
Start: 2015-10-29 — End: 2015-10-29
  Filled 2015-10-29: qty 1

## 2015-10-29 NOTE — Care Management Important Message (Signed)
Important Message  Patient Details  Name: [name] Small MRN: [id number] Date of Birth: [birth year]   Medicare Important Message Given:  Yes    [name] P [name] 10/29/2015, 2:34 PM

## 2015-10-29 NOTE — Progress Notes (Signed)
Patient Discharge: Disposition: Patient discharged to home. Education: Patient educated about medications and discharge instructions, understood and acknowledged. IV: Discontinued IV before discharge. Telemetry: Discontinued Tele before discharge, CCMD notified. Transportation: Patient walked out of the unit accompanied by the staff. Belongings: Patient took all her belongings with her.

## 2015-10-29 NOTE — Progress Notes (Signed)
Subjective:  No co/ tolerated HD yest/ She tells me "ACEEPTED  BY [facility]  HD unit' 'Schedule to be called to her today''   Objective Vital signs in last 24 hours: Filed Vitals:   10/28/15 1505 10/28/15 1721 10/28/15 2020 10/29/15 0408  BP: 101/74 117/87 101/64 107/68  Pulse: 86 88 86 81  Temp: 96.9 F (36.1 C) 98.8 F (37.1 C) 98.6 F (37 C) 98.3 F (36.8 C)  TempSrc: Oral Oral Oral Oral  Resp: 12 16 16 18   Weight: 52.7 kg (116 lb 2.9 oz)     SpO2:  99% 98% 97%   Weight change: -4.2 kg (-9 lb 4.2 oz)  Physical Exam: Gen: NAD / eating brk CVS: RRR Resp:CTA bilat Abd: Soft, flat, nontender bowel sounds normal LPN:PYYFR  lower extremity edema HD Access=  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 Critical hyperkalemia: sec  To missed HD ( [facility]) Corrected with emergent HDon 1/04 and had hd yest. / k 4.1 yest pre hd  2. ESRD: HD  On 1/04 and 1/05 and Now accepted by [facility] unit in  [place] by her report. She lives in [place] with her parents. NO HD needs today/ stable for dc   3. HTN/Volume overload:  BP stable now with hd UF and BP meds 4. Secondary hyperparathyroidism: Elevated phosphorus improved with HD and binder in hosp. binders resumed and remains on cinacalcet for elevated PTH. 5. Anemia: HO Sickle cell and ESRD anemia =HGB 9.6   given a dose of Aranesp 1/05.  Lenny Pastel, PA-C [facility] [phone] 10/29/2015,8:10 AM  LOS: 2 days  I have seen and examined this patient and agree with plan per Lenny Pastel.  Has spot at [facility] unit with [facility] MWF first shift.  Plan HD today to keep on schedule then she can be DC'd. Marque Rademaker T,MD 10/29/2015 10:33 AM Labs: Basic Metabolic Panel:  Recent Labs Lab 10/27/15 1530 10/28/15 0506 10/28/15 1315  NA 140 139 136  K 7.1* 4.4 4.1  CL 103 99* 98*  CO2 23 29 26   GLUCOSE 78 91 87  BUN 73* 19 11  CREATININE 10.23* 4.69*  2.87*  CALCIUM 10.2 10.1 9.1  PHOS  --  5.7* 3.6   Liver Function Tests:  Recent Labs Lab 10/26/15 0136 10/28/15 0506 10/28/15 1315  AST 23  --   --   ALT 12*  --   --   ALKPHOS 450*  --   --   BILITOT 1.0  --   --   PROT 8.4*  --   --   ALBUMIN 4.1 3.3* 3.3*    Recent Labs Lab 10/26/15 0136  LIPASE 50   No results for input(s): AMMONIA in the last 168 hours. CBC:  Recent Labs Lab 10/26/15 0135 10/26/15 0152 10/27/15 1530 10/28/15 0506  WBC 7.3  --  5.6 5.0  HGB 10.5* 12.9 10.1* 9.6*  HCT 33.4* 38.0 32.5* 31.3*  MCV 93.0  --  91.3 91.3  PLT 174  --  164 147*   Cardiac Enzymes:  Recent Labs Lab 10/28/15 0021 10/28/15 0506 10/28/15 1128  TROPONINI 0.08* 0.09* 0.09*   CBG: No results for input(s): GLUCAP in the last 168 hours.  Studies/Results: Dg Chest 2 View  10/27/2015  CLINICAL DATA:  Left-sided chest pain.  History of cardiomyopathy. EXAM: CHEST  2 VIEW COMPARISON:  October 26, 2015 FINDINGS: There is slight scarring in  the left base. The lungs elsewhere clear. Cardiomegaly is stable. Pacemaker lead tip is attached to the right ventricle. No adenopathy. Pulmonary vascularity appears normal. No bone lesions. There is a filter in the inferior vena cava with the apex at the level of L2. IMPRESSION: Stable cardiomegaly.  No edema or consolidation. Electronically Signed   By: Bretta Bang III M.D.   On: 10/27/2015 15:44   Medications:   . amLODipine  2.5 mg Oral QHS  . carvedilol  6.25 mg Oral BID WC  . cinacalcet  30 mg Oral Q breakfast  . darbepoetin (ARANESP) injection - DIALYSIS  60 mcg Intravenous Q Thu-HD  . heparin  5,000 Units Subcutaneous 3 times per day  . isosorbide dinitrate  10 mg Oral TID  . multivitamin  1 tablet Oral QHS  . nicotine  7 mg Transdermal Daily  . polyethylene glycol  17 g Oral Daily  . sertraline  100 mg Oral Daily  . sevelamer carbonate  2,400 mg Oral TID WC

## 2015-10-29 NOTE — Discharge Summary (Signed)
Family Medicine Teaching [facility] Discharge Summary  Patient name: [name] Small Medical record number: [id number] Date of birth: 08-10-1991 Age: 25 y.o. Gender: female Date of Admission: 10/27/2015  Date of Discharge: 10/29/2015 Admitting Physician: Doreene Eland, MD  Primary Care Provider: No PCP Per Patient Consultants: nephrology  Indication for Hospitalization: chest pain  Discharge Diagnoses/Problem List:  Chest pain ESRD PoCKD HFrEF Hypertension Anemia of chronic disease Chronic pain  Disposition: home  Discharge Condition: improved and stable  Discharge Exam:  Temp: [96.9 F (36.1 C)-98.8 F (37.1 C)] 98 F (36.7 C) (01/06 0852) Pulse Rate: [80-89] 80 (01/06 0852) Resp: [11-19] 17 (01/06 0852) BP: (101-128)/(64-95) 101/68 mmHg (01/06 0852) SpO2: [97 %-100 %] 100 % (01/06 0852) Weight: [116 lb 2.9 oz (52.7 kg)-123 lb 7.3 oz (56 kg)] 116 lb 2.9 oz (52.7 kg) (01/05 1505) Physical Exam: Gen: appears well, face appears puffy Oropharynx: clear, moist CV: regular rate and rythm. S1 & S2 audible Resp: no apparent work of breathing, clear to auscultation bilaterally. GI: bowel sounds normal, no tenderness to palpation, no rebound or guarding, no mass.  Skin: no lesion MSK: no ext edema  Brief Hospital Course:  [name] Small is a 25 y.o. female presenting with left sided chest pain for three days that has started when she was lying.  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  Nonanginal Chest pain: improved. Pain without radiation and worse with sitting up in bed but not with exersion. She had no dyspnea or orthopnea. She had BUN of 73 and K of 7.1. She also had some facial edema, JVD and hepatojugular reflux.  However, no shortness of breath, lower extremity edema or crackles. Echo was obtained and unchanged from her Echo in 03/2015. EKG with TW changes, RAD and 1 deg AVB (unchanged from baseline). Troponin 0.08>0.09>0.09 which is better than her baseline. IVC filter was in place. CBC without leukocytosis. Chest X-ray with stable cardiomegaly, no edema or consolidation. She received dilaudid, then her home oxycodone for pain, and discharged home on her home medication. On day of discharge she threatened to leave AMA but had to stay for her hemodialysis.   Hyperkalemia: Resolved. Patient with K to 7.1 in the ED. EKG without peaked T waves.She received calcium gluconate, novolog 10 units + D50 in the ED. Then she received her HD. Her potassium trended dow to 4.4 and remained stable.  ESRD on NW:GNFAOZHYQ to PoCKD. Used to receive her hemodialysis in [place]. She recently moved here and has established her HD (MWF) with [facility] in [place]. She will start her first HD there in three days. She received her HD, home Cinacalcet, Rena-vit & Renvela while in house.  Anemia of chronic disease: Likely secondary to ESRD. Hgb 10.1 in the ED (at baseline). Last iron panel showed normal iron and high ferritin. Patient reported being diagnosed with sickle cell anemia 2 years ago. We suspect that this is likely sickle cell trait. We sent blood for hemoglobinopathy evaluation and pending at time of discharge.  HFrEF secondary to nonischemic cardiomyopathy s/p pacemaker placement: Last ECHO (03/2015): EF 20-25%, severely dilated left ventricle, diffuse hypokinesis, small to moderate pericardial effusion. Exam with JUD and hepatojugular reflux. Echo today unchanged from prior.  Other chronic conditions were stable  Issues for Follow Up:  1. Nonanginal Chest pain: improved on discharge. Could be related elevated BUN (73) and K 2. Report of Sickle cell disease: hemoglobinopathy panel pending 3. ESRD: patient to start HD (MWF)  with [facility] in [place]  Significant Procedures: Hemodialysis, echo  Significant Labs and Imaging:   Recent Labs Lab 10/26/15 0135 10/26/15 0152 10/27/15 1530 10/28/15 0506  WBC 7.3  --  5.6 5.0  HGB 10.5* 12.9 10.1* 9.6*  HCT 33.4* 38.0 32.5* 31.3*  PLT 174  --  164 147*    Recent Labs Lab 10/26/15 0135 10/26/15 0136 10/26/15 0152 10/27/15 1530 10/28/15 0506 10/28/15 1315  NA 143  --  142 140 139 136  K 5.2*  --  5.1 7.1* 4.4 4.1  CL 102  --  103 103 99* 98*  CO2 26  --   --  23 29 26   GLUCOSE 95  --  91 78 91 87  BUN 62*  --  61* 73* 19 11  CREATININE 8.46*  --  8.20* 10.23* 4.69* 2.87*  CALCIUM 10.4*  --   --  10.2 10.1 9.1  PHOS  --   --   --   --  5.7* 3.6  ALKPHOS  --  450*  --   --   --   --   AST  --  23  --   --   --   --   ALT  --  12*  --   --   --   --   ALBUMIN  --  4.1  --   --  3.3* 3.3*    Results/Tests Pending at Time of Discharge: hemoglobinopathy panel  Discharge Medications:    Medication List    TAKE these medications        amLODipine 2.5 MG tablet  Commonly known as:  NORVASC  Take 1 tablet (2.5 mg total) by mouth at bedtime.     carvedilol 6.25 MG tablet  Commonly known as:  COREG  Take 1 tablet (6.25 mg total) by mouth 2 (two) times daily.     cinacalcet 30 MG tablet  Commonly known as:  SENSIPAR  Take 1 tablet (30 mg total) by mouth daily with supper.     isosorbide dinitrate 10 MG tablet  Commonly known as:  ISORDIL  Take 1 tablet (10 mg total) by mouth 3 (three) times daily.     LORazepam 1 MG tablet  Commonly known as:  ATIVAN  Take 1 tablet (1 mg total) by mouth daily as needed for anxiety. Patient states she takes one tablet by mouth every day per patient     multivitamin Tabs tablet  Take 1 tablet by mouth daily.     oxyCODONE 5 MG immediate release tablet  Commonly known as:  ROXICODONE  Take 1 tablet (5 mg total) by mouth every 4 (four) hours as needed for severe pain.     sevelamer carbonate 800 MG tablet   Commonly known as:  RENVELA  Take 3 tablets (2,400 mg total) by mouth 3 (three) times daily with meals.        Discharge Instructions: Please refer to Patient Instructions section of EMR for full details.  Patient was counseled important signs and symptoms that should prompt return to medical care, changes in medications, dietary instructions, activity restrictions, and follow up appointments.   Follow-Up Appointments: Patient reports having outpatient follow up with [facility] on 11/05/2015  Almon Hercules, MD 10/29/2015, 2:40 PM PGY-1, [facility]  Family Medicine

## 2015-10-29 NOTE — Progress Notes (Signed)
Family Medicine Teaching Service Daily Progress Note Intern Pager: [phone]  Patient name: [name] Small Medical record number: [id number] Date of birth: [birth year] Age: 25 y.o. Gender: female  Primary Care Provider: No PCP Per Patient Consultants: nephrology Code Status: full  Pt Overview and Major Events to Date:  1/4 admitted with chest pain  Assessment and Plan: [name] Small is a 25 y.o. female presenting with left sided chest pain. 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  Chest pain: improved. Still with some facial edema and hepatojugular reflux. However, no shortness of breath  or crackles. Received HD yesterday.EKG with TW changes, RAD and 1 deg AVB (unchanged from baseline). Troponin better than baseline. IVC filter in place so PE is less likely, and current oxygen sat normal, without tachypnea. Echo no change compared to prior. -Discharge today after HD - On home pain meds - Zofran for nausea - Cardiac monitoring and continuous pulse ox - Vitals per unit routine  Hyper K:  Resolved. K 7.1 in the ED. EKG without peaked T waves.K 4.4 this am - s/p calcium gluconate, novolog 10 units + D50 in the ED - Nephrology following. Received HD yesterday and getting one today.  ESRD on HD:   - Appreciate Nephrology  Recs:   -D/c after HD today - Continue home meds: Cinacalcet, Rena-vit, Renvela. Unclear if she was taking all of these, stated only taking rena-vit - continue home Ativan for anxiety during dialysis -She will start HD in [place] on monday  Sickle cell anemia: Pt states she was diagnosed with this 2 years ago. We suspect that she has sickle cell trait, not sickle cell disease.  -follow up on hemoglobinopathy labs  HFrEF  secondary to nonischemic cardiomyopathy s/p pacemaker placement: Last ECHO (03/2015): EF 20-25%, severely dilated left ventricle, diffuse hypokinesis, small to moderate pericardial effusion. Exam with JUD and hepatojugular reflux. TTE today unchanged from prior   I-stat Beta HCG 27 on admission:  Repeat serum bHCG negative  Hypertension: BPs ranging from 120-132/84-93 in the ED.  - Continue home BP meds: Norvasc 2.5mg  daily, Coreg 6.25mg  bid, Isosorbide dinitrate 10mg  daily  Anemia of chronic disease: Likely secondary to ESRD. Hgb 10.1 in the ED (at baseline). Last iron panel showed normal iron and high ferritin. - AM CBC  Hx PE s/p IVC filter: - Heparin sq for prophylaxis  Tobacco abuse: - Nicotine patch per patient request  Chronic pain: Takes Oxycodone 10mg  at home. - Tylenol and Dilaudid 1mg  po for pain  Depression, stable: - Continue home med: Zoloft 100mg  daily  FEN/GI: Heart healthy diet, SLIV, Miralax daily  Prophylaxis: Heparin sq  Disposition:  Discharge home after HD today.  Disposition: floor pending echo, bHCG and improvement in chest pain. SW for outpt HD (likely going to [place]).  Subjective:  Reports pain has improved.  Denies shortness of breath. Asks to be discharged. PCP [facility] in [place]. HD in [place] MWF  Objective: Temp:  [96.9 F (36.1 C)-98.8 F (37.1 C)] 98 F (36.7 C) (01/06 0852) Pulse Rate:  [80-89] 80 (01/06 0852) Resp:  [11-19] 17 (01/06 0852) BP: (101-128)/(64-95) 101/68 mmHg (01/06 0852) SpO2:  [97 %-100 %] 100 % (01/06 0852) Weight:  [116 lb 2.9 oz (52.7 kg)-123 lb 7.3 oz (56 kg)] 116 lb 2.9  oz (52.7 kg) (01/05 1505) Physical Exam: Gen: appears well, face appears puffy Oropharynx: clear, moist CV: regular rate and rythm. S1 & S2 audible Resp: no apparent work of breathing, clear to auscultation bilaterally. GI: bowel sounds normal, no tenderness to palpation, no rebound or guarding, no mass.  Skin: no lesion MSK: no ext  edema  Laboratory:  Recent Labs Lab 10/26/15 0135 10/26/15 0152 10/27/15 1530 10/28/15 0506  WBC 7.3  --  5.6 5.0  HGB 10.5* 12.9 10.1* 9.6*  HCT 33.4* 38.0 32.5* 31.3*  PLT 174  --  164 147*    Recent Labs Lab 10/26/15 0136  10/27/15 1530 10/28/15 0506 10/28/15 1315  NA  --   < > 140 139 136  K  --   < > 7.1* 4.4 4.1  CL  --   < > 103 99* 98*  CO2  --   --  BUN  --   < > 73* 19 11  CREATININE  --   < > 10.23* 4.69* 2.87*  CALCIUM  --   --  10.2 10.1 9.1  PROT 8.4*  --   --   --   --   BILITOT 1.0  --   --   --   --   ALKPHOS 450*  --   --   --   --   ALT 12*  --   --   --   --   AST 23  --   --   --   --   GLUCOSE  --   < > 78 91 87  < > = values in this interval not displayed.  Imaging/Diagnostic Tests: No results found.  Almon Hercules, MD 10/29/2015, 10:05 AM PGY-1, [facility] FPTS Intern pager: [phone], text pages welcome

## 2015-10-29 NOTE — Progress Notes (Signed)
  Echocardiogram 2D Echocardiogram has been performed.  [name] Small 10/29/2015, 9:52 AM

## 2015-10-29 NOTE — Progress Notes (Signed)
Late note for 10/28/2015, Patient refusing Heparin-VTE prophylaxis, made doctor aware.

## 2015-10-29 NOTE — Procedures (Signed)
Pt seen on HD.  Ap 160 Vp 190  BFR 300.  SBP 120. Tolerating HD well so far.

## 2015-10-29 NOTE — Discharge Instructions (Signed)
It has been a pleasure taking care of you! You were admitted due to chest pain, which was likely from your chest muscle. It is unlikely that this could be life threatening after the investigations we have done.  We are discharging you on your home medications that you need to continue taking after you leave the hospital. We may have made some adjustments to your other medications. Please, make sure to read the directions before you take them. The names and directions on how to take these medications are found on this discharge paper under medication section.  You also need a follow up with your primary care doctor. Please, give them a call for hospital follow up by next week.  Take care,

## 2015-10-31 DIAGNOSIS — R079 Chest pain, unspecified: Secondary | ICD-10-CM | POA: Insufficient documentation

## 2015-11-01 LAB — HEMOGLOBINOPATHY EVALUATION
HGB C: 0 %
HGB S QUANTITAION: 0 %
Hgb A2 Quant: 1.9 % (ref 0.7–3.1)
Hgb A: 98.1 % — ABNORMAL HIGH (ref 94.0–98.0)
Hgb F Quant: 0 % (ref 0.0–2.0)

## 2015-11-03 ENCOUNTER — Encounter (HOSPITAL_BASED_OUTPATIENT_CLINIC_OR_DEPARTMENT_OTHER): Payer: Self-pay | Admitting: *Deleted

## 2015-11-03 ENCOUNTER — Emergency Department (HOSPITAL_BASED_OUTPATIENT_CLINIC_OR_DEPARTMENT_OTHER)
Admission: EM | Admit: 2015-11-03 | Discharge: 2015-11-03 | Disposition: A | Discharge status: Home / Self Care | Payer: Medicare Other | Attending: Emergency Medicine | Admitting: Emergency Medicine

## 2015-11-03 ENCOUNTER — Emergency Department (HOSPITAL_BASED_OUTPATIENT_CLINIC_OR_DEPARTMENT_OTHER): Payer: Medicare Other

## 2015-11-03 DIAGNOSIS — Z87448 Personal history of other diseases of urinary system: Secondary | ICD-10-CM | POA: Insufficient documentation

## 2015-11-03 DIAGNOSIS — R079 Chest pain, unspecified: Secondary | ICD-10-CM | POA: Diagnosis not present

## 2015-11-03 DIAGNOSIS — Z8781 Personal history of (healed) traumatic fracture: Secondary | ICD-10-CM | POA: Diagnosis not present

## 2015-11-03 DIAGNOSIS — Z79899 Other long term (current) drug therapy: Secondary | ICD-10-CM | POA: Insufficient documentation

## 2015-11-03 DIAGNOSIS — Z86711 Personal history of pulmonary embolism: Secondary | ICD-10-CM | POA: Diagnosis not present

## 2015-11-03 DIAGNOSIS — N186 End stage renal disease: Secondary | ICD-10-CM | POA: Insufficient documentation

## 2015-11-03 DIAGNOSIS — G8929 Other chronic pain: Secondary | ICD-10-CM | POA: Diagnosis not present

## 2015-11-03 DIAGNOSIS — F17210 Nicotine dependence, cigarettes, uncomplicated: Secondary | ICD-10-CM | POA: Insufficient documentation

## 2015-11-03 DIAGNOSIS — Z872 Personal history of diseases of the skin and subcutaneous tissue: Secondary | ICD-10-CM | POA: Insufficient documentation

## 2015-11-03 DIAGNOSIS — Z992 Dependence on renal dialysis: Secondary | ICD-10-CM | POA: Insufficient documentation

## 2015-11-03 DIAGNOSIS — M791 Myalgia: Secondary | ICD-10-CM | POA: Diagnosis not present

## 2015-11-03 DIAGNOSIS — I120 Hypertensive chronic kidney disease with stage 5 chronic kidney disease or end stage renal disease: Secondary | ICD-10-CM | POA: Insufficient documentation

## 2015-11-03 DIAGNOSIS — Z862 Personal history of diseases of the blood and blood-forming organs and certain disorders involving the immune mechanism: Secondary | ICD-10-CM | POA: Diagnosis not present

## 2015-11-03 DIAGNOSIS — I509 Heart failure, unspecified: Secondary | ICD-10-CM | POA: Insufficient documentation

## 2015-11-03 DIAGNOSIS — R300 Dysuria: Secondary | ICD-10-CM | POA: Diagnosis not present

## 2015-11-03 HISTORY — DX: Age-related osteoporosis without current pathological fracture: M81.0

## 2015-11-03 LAB — BASIC METABOLIC PANEL
ANION GAP: 14 (ref 5–15)
BUN: 54 mg/dL — ABNORMAL HIGH (ref 6–20)
CO2: 26 mmol/L (ref 22–32)
Calcium: 10.3 mg/dL (ref 8.9–10.3)
Chloride: 97 mmol/L — ABNORMAL LOW (ref 101–111)
Creatinine, Ser: 7.68 mg/dL — ABNORMAL HIGH (ref 0.44–1.00)
GFR calc Af Amer: 8 mL/min — ABNORMAL LOW (ref >60)
GFR, EST NON AFRICAN AMERICAN: 7 mL/min — AB (ref >60)
GLUCOSE: 84 mg/dL (ref 65–99)
POTASSIUM: 4.5 mmol/L (ref 3.5–5.1)
Sodium: 137 mmol/L (ref 135–145)

## 2015-11-03 LAB — CBC WITH DIFFERENTIAL/PLATELET
BASOS ABS: 0 10*3/uL (ref 0.0–0.1)
Basophils Relative: 0 %
Eosinophils Absolute: 0.1 10*3/uL (ref 0.0–0.7)
Eosinophils Relative: 1 %
HEMATOCRIT: 33.1 % — AB (ref 36.0–46.0)
HEMOGLOBIN: 10.2 g/dL — AB (ref 12.0–15.0)
LYMPHS PCT: 23 %
Lymphs Abs: 1.8 10*3/uL (ref 0.7–4.0)
MCH: 28.6 pg (ref 26.0–34.0)
MCHC: 30.8 g/dL (ref 30.0–36.0)
MCV: 92.7 fL (ref 78.0–100.0)
MONO ABS: 0.9 10*3/uL (ref 0.1–1.0)
Monocytes Relative: 12 %
NEUTROS ABS: 4.9 10*3/uL (ref 1.7–7.7)
Neutrophils Relative %: 64 %
PLATELETS: 128 10*3/uL — AB (ref 150–400)
RBC: 3.57 MIL/uL — AB (ref 3.87–5.11)
RDW: 17.6 % — ABNORMAL HIGH (ref 11.5–15.5)
WBC: 7.7 10*3/uL (ref 4.0–10.5)

## 2015-11-03 MED ORDER — HYDROMORPHONE HCL 1 MG/ML IJ SOLN
INTRAMUSCULAR | Status: AC
  Administered 2015-11-03: 2 mg via INTRAMUSCULAR
  Filled 2015-11-03: qty 2

## 2015-11-03 MED ORDER — HYDROMORPHONE HCL 2 MG/ML IJ SOLN
2.0000 mg | Freq: Once | INTRAMUSCULAR | Status: AC
  Filled 2015-11-03: qty 1

## 2015-11-03 MED ORDER — DIPHENHYDRAMINE HCL 25 MG PO CAPS
ORAL_CAPSULE | ORAL | Status: AC
  Filled 2015-11-03: qty 1

## 2015-11-03 MED ORDER — DIPHENHYDRAMINE HCL 25 MG PO CAPS
25.0000 mg | ORAL_CAPSULE | Freq: Once | ORAL | Status: AC
  Administered 2015-11-03: 25 mg via ORAL

## 2015-11-03 NOTE — Discharge Instructions (Signed)
Workup for the chest pain without any acute findings. Electrolytes are normal continue with your dialysis scheduled for tomorrow. Pain medication you received here will last a long period of time due to your renal failure. Resource guide provided as needed.   Emergency Department Resource Guide 1) Find a Doctor and Pay Out of Pocket Although you won't have to find out who is covered by your insurance plan, it is a good idea to ask around and get recommendations. You will then need to call the office and see if the doctor you have chosen will accept you as a new patient and what types of options they offer for patients who are self-pay. Some doctors offer discounts or will set up payment plans for their patients who do not have insurance, but you will need to ask so you aren't surprised when you get to your appointment.  2) Contact Your Local Health Department Not all health departments have doctors that can see patients for sick visits, but many do, so it is worth a call to see if yours does. If you don't know where your local health department is, you can check in your phone book. The CDC also has a tool to help you locate your state's health department, and many state websites also have listings of all of their local health departments.  3) Find a Walk-in Clinic If your illness is not likely to be very severe or complicated, you may want to try a walk in clinic. These are popping up all over the country in pharmacies, drugstores, and shopping centers. They're usually staffed by nurse practitioners or physician assistants that have been trained to treat common illnesses and complaints. They're usually fairly quick and inexpensive. However, if you have serious medical issues or chronic medical problems, these are probably not your best option.  No Primary Care Doctor: - Call Health Connect at  [phone] - they can help you locate a primary care doctor that  accepts your insurance, provides certain  services, etc. - Physician Referral Service- [phone]  Chronic Pain Problems: Organization         Address  Phone   Notes  [facility]  [phone] Patients need to be referred by their primary care doctor.   Medication Assistance: Organization         Address  Phone   Notes  [facility] [address] [place] [place], [place] [address] [phone] --Must be a resident of [facility] -- Must have NO insurance coverage whatsoever (no Medicaid/ Medicare, etc.) -- The pt. MUST have a primary care doctor that directs their care regularly and follows them in the community   [facility]  [phone]   [employer]  [phone]    Agencies that provide inexpensive medical care: Organization         Address  Phone   Notes  [facility]  [phone]   [facility]    [phone]   [facility] [address] [place], [place] [address] [phone]   [facility] [address] [place] [place] [phone]   Planned Parenthood    [phone]   [facility]    [phone]   [facility]  [address], [place] Phone:  [phone], Fax:  [phone] Hours of Operation:  9 am - 6 pm, M-F.  Also accepts Medicaid/Medicare  and self-pay.  [facility]  [address], [place] Phone: [phone], Fax: [phone]. Hours of Operation:  8:30 am - 5:30 pm, M-F.  Also accepts Medicaid and self-pay.  [facility] [place] [address], [place] Phone: [phone]   [facility] [address] Natasha Bence [place], [place] [phone], Ext. [phone] Mondays & Thursdays: 7-9 AM.  First 15 patients are seen on a first come, first serve basis.    Medicaid-accepting [facility] Providers:  Organization         Address  Phone   Notes  [facility] [address], [place] [phone] Also accepts self-pay patients.  [facility] [address] Laurell Josephs [place], [place]  [phone]   [facility] [address], [place] [phone]   [facility] [address] [place] [phone]   Renaye Rakers [address], [place]   [phone] Only accepts [place] Access [place] patients after they have their name applied to their card.   Self-Pay (no insurance) in [facility]:  Organization         Address  Phone   Notes  Sickle Cell Patients, [facility] [address] [place], [place] [phone]   [facility] [address] [place], [place] [phone]   [facility]  [address], [place] [phone]   [facility]/Dr. Osei-Bonsu  [address] [place] or [address], [place] [phone] Phone number for both [place] and [place] locations is the same.  [facility] and [facility] [address], [place] [phone]   [facility] [address] [place] or [address] [phone]   [facility] [address], [place] [phone], phone; [phone], fax Sees patients 1st and 3rd Saturday of every month.  Must not qualify for public or private insurance (i.e. Medicaid, Medicare, [place] Health Choice, Veterans' Benefits)  Household income should be no more than 200% of the poverty level The clinic cannot treat you if you are pregnant or think you are pregnant  Sexually transmitted diseases are not treated at the clinic.    Dental Care: Organization         Address  Phone  Notes  [facility] Department of [facility] [address] [place], [place] [phone] Accepts  children up to age 11 who are enrolled in [place] or [place] Health Choice; pregnant women with a Medicaid card; and children who have applied for Medicaid or [place] Health Choice, but were declined, whose parents can pay a reduced fee at time of service.  [facility]  [address], [place] [phone] Accepts children up to age 76 who are enrolled in [place] or [place] Health Choice; pregnant women with a Medicaid card; and children who have applied for Medicaid or [place] Health Choice, but were declined, whose parents can pay a reduced fee at time of service.  [facility]  [address] [place]. [place], [place] [phone] Patients are seen by appointment only. Walk-ins are not accepted. [facility] will see patients 16 years of age and older. Monday - Tuesday (8am-5pm) Most Wednesdays (  8:30-5pm) $30 per visit, cash only  [facility]  [address], [facility] [phone] Patients are seen by appointment only. Walk-ins are not accepted. [facility] will see patients 63 years of age and older. One Wednesday Evening (Monthly: Volunteer Based).  $30 per visit, cash only  [employer]  [phone] for adults; Children under age 72, call Graduate Pediatric Dentistry at [phone]. Children aged 64-14, please call [phone] to request a pediatric application.  Dental services are provided in all areas of dental care including fillings, crowns and bridges, complete and partial dentures, implants, gum treatment, root canals, and extractions. Preventive care is also provided. Treatment is provided to both adults and children. Patients are selected via a lottery and there is often a waiting list.   [facility] [address] [place]  [phone] [email or web address]   [facility] [address] [place], [place] [phone], Ext. [phone] Second and  Fourth Thursday of each month, opens at 6:30 AM; Clinic ends at 9 AM.  Patients are seen on a first-come first-served basis, and a limited number are seen during each clinic.   [facility]  [address] [place], [place] [phone]   Eligibility Requirements You must have lived in [place], [place], or [place] counties for at least the last three months.   You cannot be eligible for state or federal sponsored [place], including [employer], [place], or [employer].   You generally cannot be eligible for healthcare insurance through your employer.    How to apply: Eligibility screenings are held every Tuesday and Wednesday afternoon from 1:00 pm until 4:00 pm. You do not need an appointment for the interview!  [facility] [address], [place], [place] [phone]   [facility] Health [facility]  [phone]   [facility] Health Department  [phone]   [facility]  [phone]    Behavioral Health Resources in the Community: Intensive Outpatient Programs Organization         Address  Phone  Notes  [facility] Services [address] [place], [place] [phone]   [facility] Outpatient [address], [place], [place] [phone]   [facility] [address] [place], [place]  [phone]   [facility] [address]  [place], [place] [phone] or [phone]   Substance Abuse Resources Organization         Address  Phone  Notes  [facility]  [phone]   [facility]  [phone]   [facility]  [phone]   Floydene Flock  [phone]   Residential & Outpatient Substance Abuse Program  [phone]   Psychological Services Organization         Address  Phone  Notes  [facility]  [phone]   [facility]  [phone]   [facility]  [address], [place] [phone] or [phone]    Mobile Crisis Teams Organization         Address  Phone  Notes  [facility], Mobile Crisis Care Unit  [phone]   [facility]  [address] [place], [place] [phone]   Doristine Locks [address] [place] [phone]    Self-Help/Support Groups Organization         Address  Phone  Notes  [facility] [place] - variety of support groups  [place] Call for more information  Narcotics Anonymous (NA), [facility] [address], [employer] [place]  2 meetings at this location   Special educational needs teacher         Address  Phone  Notes  [facility] [place],    [place]  [phone]   [facility]  [address] [place] [phone], [place], [place]   [place], [place] [phone] Admissions: 8am-3pm M-F  Incentives Substance [place] [address]    [place], [place] [phone]   [facility] [address] [place], [place], [place]   [facility] [address],  [place], [place]   [facility] - Intensive Outpatient [address] Kristeen Mans [address], [place], [place]   [facility] [place]  [place], [place] [phone] or [phone]   [facility] ([facility]) [address] [place], [place] Accepts Medicaid  [facility] [employer] [address],  [place] [phone] Substance Abuse/Addiction Treatment   [facility] Organization         Address  Phone  Notes  [facility]  [phone]   Domenic Schwab, PhD [address] Arlis Porta [place], [place]   [phone] or [phone]   [place], [place] [phone]   [facility] [address] [place], [place] [phone] Insurance/Medicaid/sponsorship through [facility] and Families [address] [place]                                    [place], [place] [phone] [place] [address], [place] [phone]    Dr. Adele Schilder  [phone]   [facility] Dept. 1) [address] [place] 2) [place] 3)  [place] 65, [place] [phone]  [phone]   [place] [phone] or [phone] (After Hours)

## 2015-11-03 NOTE — ED Notes (Signed)
Patient states she has had central to left chest pain since yesterday.  Describes the pain as sharpe.

## 2015-11-03 NOTE — ED Provider Notes (Signed)
CSN: [id number]     Arrival date & time 11/03/15  0820 History   First MD Initiated Contact with Patient 11/03/15 [phone]     Chief Complaint  Patient presents with  . Chest Pain     (Consider location/radiation/quality/duration/timing/severity/associated sxs/prior Treatment) Patient is a 25 y.o. female presenting with chest pain. The history is provided by the patient.  Chest Pain Associated symptoms: no abdominal pain, no fever, no headache and no shortness of breath    patient is end-stage renal dialysis patient now [facility] was dialyzed yesterday. Will be dialyzed on Tuesday Thursdays and Saturdays. Patient with acute onset of chest pain that started yesterday after dialysis. Patient also with pain all over. Patient known to have a history of chronic chest pain problems. No shortness of breath.  Past Medical History  Diagnosis Date  . Hemodialysis patient (HCC)   . Hypertension   . Pulmonary emboli (HCC) 01/2012    Bilateral, moderate clot burden, areas of pulmonary infarction and central necrosis  . CHF (congestive heart failure) (HCC)   . Cardiomyopathy   . Dysrhythmia     at times per pt.  . Anemia   . H/O transfusion of packed red blood cells   . 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 Polycystic kidney disease   . Cellulitis and abscess of face 03/22/2013  . Renal insufficiency   . Sickle cell anemia (HCC)   . Chronic pain   . GERD (gastroesophageal reflux disease)   . Depression   . Hyperkalemia 09/2015  . Pelvic fracture (HCC)   . Osteoporosis    Past Surgical History  Procedure Laterality Date  . Nephrectomy    . Av fistula placement    . Kidney transplant  2008    failed  . Tonsillectomy      as a child.  . Adenoidectomy    . Incision and drainage abscess Right 03/21/2013    Procedure: INCISION AND DRAINAGE RIGHT CHEEK ABSCESS REMOVAL  OF FOREIGN BODY;  Surgeon: Serena Colonel, MD;  [facility];  Service: ENT;  Laterality: Right;  . Implantable cardioverter defibrillator implant Right 12/2014   Family History  Problem Relation Age of Onset  . Polycystic kidney disease Father    Social History  Substance Use Topics  . Smoking status: Current Some Day Smoker -- 1.00 packs/day for 1 years    Types: Cigarettes  . Smokeless tobacco: Never Used  . Alcohol Use: No   OB History    No data available     Review of Systems  Constitutional: Negative for fever.  HENT: Negative for congestion.   Eyes: Negative for visual disturbance.  Respiratory: Negative for shortness of breath.   Cardiovascular: Positive for chest pain.  Gastrointestinal: Negative for abdominal pain.  Genitourinary: Positive for dysuria.  Musculoskeletal: Positive for myalgias.  Skin: Negative for rash.  Neurological: Negative for headaches.  Hematological: Bruises/bleeds easily.  Psychiatric/Behavioral: Negative for suicidal ideas and confusion.      Allergies  Tramadol; Vicodin; Buprenorphine hcl; Iohexol; and Morphine and related  Home Medications   Prior to Admission medications   Medication Sig Start Date End Date Taking? Authorizing Provider  amLODipine (NORVASC) 2.5 MG tablet Take 1 tablet (2.5 mg total) by mouth at bedtime. Patient not taking: Reported on 10/26/2015 10/12/15   Ripudeep Jenna Luo, MD  carvedilol (COREG) 6.25 MG tablet Take 1 tablet (  6.25 mg total) by mouth 2 (two) times daily. 09/25/15   Zannie Cove, MD  cinacalcet (SENSIPAR) 30 MG tablet Take 1 tablet (30 mg total) by mouth daily with supper. Patient taking differently: Take 30 mg by mouth daily with breakfast.  09/25/15   Zannie Cove, MD  isosorbide dinitrate (ISORDIL) 10 MG tablet Take 1 tablet (10 mg total) by mouth 3 (three) times daily. 09/25/15   Zannie Cove, MD  LORazepam (ATIVAN) 1 MG tablet Take 1 tablet (1 mg total) by mouth daily as needed for anxiety.  Patient states she takes one tablet by mouth every day per patient 10/12/15   Ripudeep Jenna Luo, MD  multivitamin (RENA-VIT) TABS tablet Take 1 tablet by mouth daily.    Historical Provider, MD  oxyCODONE (ROXICODONE) 5 MG immediate release tablet Take 1 tablet (5 mg total) by mouth every 4 (four) hours as needed for severe pain. 10/26/15   [name], DO  sevelamer carbonate (RENVELA) 800 MG tablet Take 3 tablets (2,400 mg total) by mouth 3 (three) times daily with meals. 09/25/15 09/24/16  Zannie Cove, MD   BP 141/108 mmHg  Pulse 112  Temp(Src) 98.2 F (36.8 C) (Oral)  Resp 20  Ht 5\' 3"  (1.6 m)  Wt 58.968 kg  BMI 23.03 kg/m2  SpO2 100% Physical Exam  Constitutional: She is oriented to person, place, and time. She appears well-developed and well-nourished. No distress.  HENT:  Head: Normocephalic and atraumatic.  Mouth/Throat: Oropharynx is clear and moist.  Eyes: Conjunctivae and EOM are normal. Pupils are equal, round, and reactive to light.  Neck: Normal range of motion. Neck supple.  Cardiovascular: Regular rhythm.   No murmur heard. Pulmonary/Chest: Effort normal and breath sounds normal. No respiratory distress. She has no wheezes. She has no rales.  Abdominal: Soft. Bowel sounds are normal. There is no tenderness.  Musculoskeletal: Normal range of motion.  Neurological: She is alert and oriented to person, place, and time. No cranial nerve deficit. She exhibits normal muscle tone. Coordination normal.  Skin: Skin is warm.  Nursing note and vitals reviewed.   ED Course  Procedures (including critical care time) Labs Review Labs Reviewed  CBC WITH DIFFERENTIAL/PLATELET - Abnormal; Notable for the following:    RBC 3.57 (*)    Hemoglobin 10.2 (*)    HCT 33.1 (*)    RDW 17.6 (*)    Platelets 128 (*)    All other components within normal limits  BASIC METABOLIC PANEL - Abnormal; Notable for the following:    Chloride 97 (*)    BUN 54 (*)    Creatinine, Ser 7.68 (*)    GFR  calc non Af Amer 7 (*)    GFR calc Af Amer 8 (*)    All other components within normal limits    Imaging Review Dg Chest 2 View  11/03/2015  CLINICAL DATA:  Left-sided chest pain since yesterday. EXAM: CHEST  2 VIEW COMPARISON:  11/03/2015 FINDINGS: There is no focal parenchymal opacity. There is no pleural effusion or pneumothorax. There is stable cardiomegaly. There is a single lead cardiac pacemaker. The osseous structures are unremarkable. IMPRESSION: No active cardiopulmonary disease. Electronically Signed   By: Elige Ko   On: 11/03/2015 08:54   I have personally reviewed and evaluated these images and lab results as part of my medical decision-making.   EKG Interpretation   Date/Time:  Wednesday November 03 2015 08:26:57 EST Ventricular Rate:  125 PR Interval:    QRS Duration: 96  QT Interval:  366 QTC Calculation: 528 R Axis:   95 Text Interpretation:  Junctional tachycardia Borderline right axis  deviation Nonspecific repol abnormality, lateral leads Prolonged QT  interval Confirmed by Kean Gautreau  MD, Lynel Forester ([id number]) on 11/03/2015 8:41:18 AM      MDM   Final diagnoses:  Chest pain, unspecified chest pain type    Patient frequently seen in the [place] systolic for either need for dialysis or chest pain. Patient is now receiving dialysis at a center in [place] on Tuesday Thursdays and Saturdays. Patient did go to dialysis yesterday. Patient's potassium here today is fine. Chest x-ray negative for pneumonia pneumothorax or pulmonary edema. Labs without significant abnormalities. EKG without any acute findings other than the tachycardia which improved with pain control. Patient given 2 mg of hydromorphone IM. This will hold her until dialysis tomorrow. Chest pain resolved. Do not feel that this is an acute cardiac event.  Patient's oxygen saturations are fine. Also feel the tachycardia is not related to pulmonary embolus. Patient does have a filter in place.   Patient  also to the nurse expressed some depression symptoms resource guide provided. Patient denied any suicidal ideation.  Vanetta Mulders, MD 11/03/15 [phone]

## 2015-11-07 ENCOUNTER — Emergency Department (HOSPITAL_COMMUNITY): Payer: Medicare Other

## 2015-11-07 ENCOUNTER — Encounter (HOSPITAL_COMMUNITY): Payer: Self-pay | Admitting: Emergency Medicine

## 2015-11-07 ENCOUNTER — Emergency Department (HOSPITAL_COMMUNITY)
Admission: EM | Admit: 2015-11-07 | Discharge: 2015-11-07 | Disposition: A | Discharge status: Home / Self Care | Payer: Medicare Other | Attending: Emergency Medicine | Admitting: Emergency Medicine

## 2015-11-07 DIAGNOSIS — Z872 Personal history of diseases of the skin and subcutaneous tissue: Secondary | ICD-10-CM | POA: Diagnosis not present

## 2015-11-07 DIAGNOSIS — R079 Chest pain, unspecified: Secondary | ICD-10-CM | POA: Diagnosis present

## 2015-11-07 DIAGNOSIS — Z79899 Other long term (current) drug therapy: Secondary | ICD-10-CM | POA: Insufficient documentation

## 2015-11-07 DIAGNOSIS — Z8781 Personal history of (healed) traumatic fracture: Secondary | ICD-10-CM | POA: Diagnosis not present

## 2015-11-07 DIAGNOSIS — Z862 Personal history of diseases of the blood and blood-forming organs and certain disorders involving the immune mechanism: Secondary | ICD-10-CM | POA: Insufficient documentation

## 2015-11-07 DIAGNOSIS — R0789 Other chest pain: Secondary | ICD-10-CM | POA: Diagnosis not present

## 2015-11-07 DIAGNOSIS — G8929 Other chronic pain: Secondary | ICD-10-CM | POA: Diagnosis not present

## 2015-11-07 DIAGNOSIS — Z8739 Personal history of other diseases of the musculoskeletal system and connective tissue: Secondary | ICD-10-CM | POA: Diagnosis not present

## 2015-11-07 DIAGNOSIS — Z992 Dependence on renal dialysis: Secondary | ICD-10-CM | POA: Diagnosis not present

## 2015-11-07 DIAGNOSIS — I509 Heart failure, unspecified: Secondary | ICD-10-CM | POA: Diagnosis not present

## 2015-11-07 DIAGNOSIS — Z86711 Personal history of pulmonary embolism: Secondary | ICD-10-CM | POA: Diagnosis not present

## 2015-11-07 DIAGNOSIS — Z8659 Personal history of other mental and behavioral disorders: Secondary | ICD-10-CM | POA: Insufficient documentation

## 2015-11-07 DIAGNOSIS — I120 Hypertensive chronic kidney disease with stage 5 chronic kidney disease or end stage renal disease: Secondary | ICD-10-CM | POA: Insufficient documentation

## 2015-11-07 DIAGNOSIS — Q613 Polycystic kidney, unspecified: Secondary | ICD-10-CM | POA: Insufficient documentation

## 2015-11-07 DIAGNOSIS — N186 End stage renal disease: Secondary | ICD-10-CM | POA: Diagnosis not present

## 2015-11-07 DIAGNOSIS — F17210 Nicotine dependence, cigarettes, uncomplicated: Secondary | ICD-10-CM | POA: Diagnosis not present

## 2015-11-07 DIAGNOSIS — Z8719 Personal history of other diseases of the digestive system: Secondary | ICD-10-CM | POA: Diagnosis not present

## 2015-11-07 LAB — CBC
HCT: 36 % (ref 36.0–46.0)
Hemoglobin: 11.1 g/dL — ABNORMAL LOW (ref 12.0–15.0)
MCH: 28.6 pg (ref 26.0–34.0)
MCHC: 30.8 g/dL (ref 30.0–36.0)
MCV: 92.8 fL (ref 78.0–100.0)
PLATELETS: 168 10*3/uL (ref 150–400)
RBC: 3.88 MIL/uL (ref 3.87–5.11)
RDW: 17.7 % — AB (ref 11.5–15.5)
WBC: 5.8 10*3/uL (ref 4.0–10.5)

## 2015-11-07 LAB — TROPONIN I: Troponin I: 0.1 ng/mL — ABNORMAL HIGH (ref <0.031)

## 2015-11-07 LAB — BASIC METABOLIC PANEL
Anion gap: 16 — ABNORMAL HIGH (ref 5–15)
BUN: 70 mg/dL — AB (ref 6–20)
CHLORIDE: 98 mmol/L — AB (ref 101–111)
CO2: 24 mmol/L (ref 22–32)
CREATININE: 8.86 mg/dL — AB (ref 0.44–1.00)
Calcium: 10.4 mg/dL — ABNORMAL HIGH (ref 8.9–10.3)
GFR calc Af Amer: 6 mL/min — ABNORMAL LOW (ref >60)
GFR, EST NON AFRICAN AMERICAN: 6 mL/min — AB (ref >60)
Glucose, Bld: 75 mg/dL (ref 65–99)
Potassium: 5.8 mmol/L — ABNORMAL HIGH (ref 3.5–5.1)
SODIUM: 138 mmol/L (ref 135–145)

## 2015-11-07 LAB — I-STAT TROPONIN, ED: Troponin i, poc: 0.1 ng/mL (ref 0.00–0.08)

## 2015-11-07 MED ORDER — NAPROXEN 500 MG PO TABS: 500.0000 mg | ORAL_TABLET | Freq: Two times a day (BID) | ORAL | Status: DC

## 2015-11-07 MED ORDER — OXYCODONE-ACETAMINOPHEN 5-325 MG PO TABS
1.0000 | ORAL_TABLET | Freq: Once | ORAL | Status: AC
  Administered 2015-11-07: 1 via ORAL
  Filled 2015-11-07: qty 1

## 2015-11-07 NOTE — ED Notes (Signed)
Pt w/ hx of heart failure, dialysis, polycystic kidney disease, defibrillator, and sickle cell.  C/o CP and low back pain since yesterday.  Has been having diarrhea and was feeling lightheaded.

## 2015-11-07 NOTE — ED Provider Notes (Signed)
CSN: [id number]     Arrival date & time 11/07/15  1559 History   First MD Initiated Contact with Patient 11/07/15 1621     Chief Complaint  Patient presents with  . Chest Pain      HPI  Patient presents for evaluation of chest pain. States she has pain just to the left of midline in her anterior parasternal chest. She states it hurts to take a deep breath or to touch on it. Does not have a rash. Does not short of breath. Was last dialyzed on Friday. Is due tomorrow morning. No extremity pain or swelling. Has history of PE, but has IVC filter. No nausea. No radiation of this very localized anterior chest pain. She states is "the pain I get all the time".  Past Medical History  Diagnosis Date  . Hemodialysis patient (HCC)   . Hypertension   . Pulmonary emboli (HCC) 01/2012    Bilateral, moderate clot burden, areas of pulmonary infarction and central necrosis  . CHF (congestive heart failure) (HCC)   . Cardiomyopathy   . Dysrhythmia     at times per pt.  . Anemia   . H/O transfusion of packed red blood cells   . 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 Polycystic kidney disease   . Cellulitis and abscess of face 03/22/2013  . Renal insufficiency   . Sickle cell anemia (HCC)   . Chronic pain   . GERD (gastroesophageal reflux disease)   . Depression   . Hyperkalemia 09/2015  . Pelvic fracture (HCC)   . Osteoporosis    Past Surgical History  Procedure Laterality Date  . Nephrectomy    . Av fistula placement    . Kidney transplant  2008    failed  . Tonsillectomy      as a child.  . Adenoidectomy    . Incision and drainage abscess Right 03/21/2013    Procedure: INCISION AND DRAINAGE RIGHT CHEEK ABSCESS REMOVAL OF FOREIGN BODY;  Surgeon: Serena Colonel, MD;  Location: [facility] OR;  Service: ENT;  Laterality: Right;  . Implantable cardioverter defibrillator implant Right 12/2014    Family History  Problem Relation Age of Onset  . Polycystic kidney disease Father    Social History  Substance Use Topics  . Smoking status: Current Some Day Smoker -- 1.00 packs/day for 1 years    Types: Cigarettes  . Smokeless tobacco: Never Used  . Alcohol Use: No   OB History    No data available     Review of Systems  Constitutional: Negative for fever, chills, diaphoresis, appetite change and fatigue.  HENT: Negative for mouth sores, sore throat and trouble swallowing.   Eyes: Negative for visual disturbance.  Respiratory: Negative for cough, chest tightness, shortness of breath and wheezing.   Cardiovascular: Positive for chest pain.  Gastrointestinal: Negative for nausea, vomiting, abdominal pain, diarrhea and abdominal distention.  Endocrine: Negative for polydipsia, polyphagia and polyuria.  Genitourinary: Negative for dysuria, frequency and hematuria.  Musculoskeletal: Negative for gait problem.  Skin: Negative for color change, pallor and rash.  Neurological: Negative for dizziness, syncope, light-headedness and headaches.  Hematological: Does not bruise/bleed easily.  Psychiatric/Behavioral: Negative for behavioral problems and confusion.      Allergies  Tramadol; Vicodin; Buprenorphine hcl; Iohexol; and Morphine and related  Home Medications   Prior to Admission medications   Medication Sig Start Date End Date  Taking? Authorizing Provider  amLODipine (NORVASC) 10 MG tablet Take 10 mg by mouth daily. 09/26/15  Yes Historical Provider, MD  carvedilol (COREG) 6.25 MG tablet Take 1 tablet (6.25 mg total) by mouth 2 (two) times daily. 09/25/15  Yes Zannie Cove, MD  cinacalcet (SENSIPAR) 30 MG tablet Take 1 tablet (30 mg total) by mouth daily with supper. Patient taking differently: Take 30 mg by mouth daily with breakfast.  09/25/15  Yes Zannie Cove, MD  isosorbide dinitrate (ISORDIL) 10 MG tablet Take 1 tablet (10 mg total) by mouth 3 (three) times daily.  09/25/15  Yes Zannie Cove, MD  LORazepam (ATIVAN) 1 MG tablet Take 1 tablet (1 mg total) by mouth daily as needed for anxiety. Patient states she takes one tablet by mouth every day per patient 10/12/15  Yes Ripudeep Jenna Luo, MD  multivitamin (RENA-VIT) TABS tablet Take 1 tablet by mouth daily.   Yes Historical Provider, MD  sevelamer carbonate (RENVELA) 800 MG tablet Take 3 tablets (2,400 mg total) by mouth 3 (three) times daily with meals. 09/25/15 09/24/16 Yes Zannie Cove, MD  amLODipine (NORVASC) 2.5 MG tablet Take 1 tablet (2.5 mg total) by mouth at bedtime. Patient not taking: Reported on 10/26/2015 10/12/15   Ripudeep Jenna Luo, MD  oxyCODONE (ROXICODONE) 5 MG immediate release tablet Take 1 tablet (5 mg total) by mouth every 4 (four) hours as needed for severe pain. Patient not taking: Reported on 11/07/2015 10/26/15   [name], DO   BP 136/98 mmHg  Pulse 94  Temp(Src) 98.6 F (37 C) (Oral)  Resp 19  SpO2 100% Physical Exam  Constitutional: She is oriented to person, place, and time. She appears well-developed and well-nourished. No distress.  HENT:  Head: Normocephalic.  Eyes: Conjunctivae are normal. Pupils are equal, round, and reactive to light. No scleral icterus.  Neck: Normal range of motion. Neck supple. No thyromegaly present.  Cardiovascular: Normal rate and regular rhythm.  Exam reveals no gallop and no friction rub.   No murmur heard. Pulmonary/Chest: Effort normal and breath sounds normal. No respiratory distress. She has no wheezes. She has no rales.    Abdominal: Soft. Bowel sounds are normal. She exhibits no distension. There is no tenderness. There is no rebound.  Musculoskeletal: Normal range of motion.  Neurological: She is alert and oriented to person, place, and time.  Skin: Skin is warm and dry. No rash noted.  Psychiatric: She has a normal mood and affect. Her behavior is normal.    ED Course  Procedures (including critical care time) Labs Review Labs  Reviewed  BASIC METABOLIC PANEL - Abnormal; Notable for the following:    Potassium 5.8 (*)    Chloride 98 (*)    BUN 70 (*)    Creatinine, Ser 8.86 (*)    Calcium 10.4 (*)    GFR calc non Af Amer 6 (*)    GFR calc Af Amer 6 (*)    Anion gap 16 (*)    All other components within normal limits  CBC - Abnormal; Notable for the following:    Hemoglobin 11.1 (*)    RDW 17.7 (*)    All other components within normal limits  I-STAT TROPOININ, ED - Abnormal; Notable for the following:    Troponin i, poc 0.10 (*)    All other components within normal limits  TROPONIN I    Imaging Review Dg Chest 2 View  11/07/2015  CLINICAL DATA:  Chest and low back pain since yesterday.  History of heart failure, polycystic kidney disease, sickle cell and hemodialysis. EXAM: CHEST  2 VIEW COMPARISON:  Radiographs 11/07/2015 and 11/03/2015. FINDINGS: 1645 hours. There is stable enlargement of the cardiac silhouette. Right subclavian AICD appears unchanged. There is no edema, confluent airspace opacity or pleural effusion. IVC filter noted. The bones appear unchanged. IMPRESSION: Stable chest from earlier today.  No acute findings. Electronically Signed   By: Carey Bullocks M.D.   On: 11/07/2015 17:02   I have personally reviewed and evaluated these images and lab results as part of my medical decision-making.   EKG Interpretation   Date/Time:  Sunday November 07 2015 16:15:02 EST Ventricular Rate:  98 PR Interval:  266 QRS Duration: 99 QT Interval:  411 QTC Calculation: 525 R Axis:   114 Text Interpretation:  Sinus rhythm Prolonged PR interval Left atrial  enlargement Right axis deviation Abnormal T, consider ischemia, diffuse  leads Prolonged QT interval Confirmed by Fayrene Fearing  MD, Tyquasia Pant ([id number]) on  11/07/2015 4:39:05 PM Also confirmed by Fayrene Fearing  MD, Brinna Divelbiss ([id number])  on  11/07/2015 6:29:05 PM      MDM   Final diagnoses:  Chest wall pain    She immediately asked me for "shot" for her chest pain.  Offered her by mouth pain medication. She states she is taking this at home and it doesn't help. Takes Percocet daily. This is chest pain. I doubt PE as patient has filter and she is not tachycardic or hypoxemic. Reproducible pain with unchanged EKG. Has history of depressed myocardial function, but known history of coronary artery disease. Doubt ACS. Initial i-STAT troponin 0.1. This is at her baseline. He is being confirmed with formal lab troponin. If this is negative I think she is appropriate for discharge to home. She should be dialyzed in the morning. I'm not concerned about her potassium of 5.8 and have asked her to plan follow-up with her dialysis tomorrow morning. She should continue her regular outpatient medications for her chronic chest pain    Rolland Porter, MD 11/07/15 [phone]

## 2015-11-07 NOTE — ED Notes (Signed)
MD at bedside. 

## 2015-11-07 NOTE — Discharge Instructions (Signed)

## 2015-11-13 ENCOUNTER — Emergency Department (HOSPITAL_COMMUNITY)
Admission: EM | Admit: 2015-11-13 | Discharge: 2015-11-13 | Disposition: A | Discharge status: Home / Self Care | Payer: Medicare Other | Attending: Emergency Medicine | Admitting: Emergency Medicine

## 2015-11-13 ENCOUNTER — Encounter (HOSPITAL_COMMUNITY): Payer: Self-pay | Admitting: Emergency Medicine

## 2015-11-13 ENCOUNTER — Emergency Department (HOSPITAL_COMMUNITY): Payer: Medicare Other

## 2015-11-13 DIAGNOSIS — I120 Hypertensive chronic kidney disease with stage 5 chronic kidney disease or end stage renal disease: Secondary | ICD-10-CM | POA: Diagnosis not present

## 2015-11-13 DIAGNOSIS — I509 Heart failure, unspecified: Secondary | ICD-10-CM | POA: Diagnosis not present

## 2015-11-13 DIAGNOSIS — Z79899 Other long term (current) drug therapy: Secondary | ICD-10-CM | POA: Insufficient documentation

## 2015-11-13 DIAGNOSIS — N186 End stage renal disease: Secondary | ICD-10-CM | POA: Diagnosis not present

## 2015-11-13 DIAGNOSIS — F17210 Nicotine dependence, cigarettes, uncomplicated: Secondary | ICD-10-CM | POA: Insufficient documentation

## 2015-11-13 DIAGNOSIS — Z86711 Personal history of pulmonary embolism: Secondary | ICD-10-CM | POA: Diagnosis not present

## 2015-11-13 DIAGNOSIS — Z862 Personal history of diseases of the blood and blood-forming organs and certain disorders involving the immune mechanism: Secondary | ICD-10-CM | POA: Insufficient documentation

## 2015-11-13 DIAGNOSIS — G8929 Other chronic pain: Secondary | ICD-10-CM | POA: Insufficient documentation

## 2015-11-13 DIAGNOSIS — F329 Major depressive disorder, single episode, unspecified: Secondary | ICD-10-CM | POA: Diagnosis not present

## 2015-11-13 DIAGNOSIS — Z992 Dependence on renal dialysis: Secondary | ICD-10-CM | POA: Diagnosis not present

## 2015-11-13 DIAGNOSIS — R0602 Shortness of breath: Secondary | ICD-10-CM | POA: Diagnosis not present

## 2015-11-13 DIAGNOSIS — K219 Gastro-esophageal reflux disease without esophagitis: Secondary | ICD-10-CM | POA: Diagnosis not present

## 2015-11-13 DIAGNOSIS — R079 Chest pain, unspecified: Secondary | ICD-10-CM | POA: Insufficient documentation

## 2015-11-13 LAB — CBC
HEMATOCRIT: 38 % (ref 36.0–46.0)
HEMOGLOBIN: 12.3 g/dL (ref 12.0–15.0)
MCH: 29.4 pg (ref 26.0–34.0)
MCHC: 32.4 g/dL (ref 30.0–36.0)
MCV: 90.7 fL (ref 78.0–100.0)
PLATELETS: 257 10*3/uL (ref 150–400)
RBC: 4.19 MIL/uL (ref 3.87–5.11)
RDW: 17.3 % — ABNORMAL HIGH (ref 11.5–15.5)
WBC: 7.9 10*3/uL (ref 4.0–10.5)

## 2015-11-13 LAB — BASIC METABOLIC PANEL
Anion gap: 18 — ABNORMAL HIGH (ref 5–15)
BUN: 44 mg/dL — ABNORMAL HIGH (ref 6–20)
CHLORIDE: 96 mmol/L — AB (ref 101–111)
CO2: 25 mmol/L (ref 22–32)
CREATININE: 5.83 mg/dL — AB (ref 0.44–1.00)
Calcium: 10.9 mg/dL — ABNORMAL HIGH (ref 8.9–10.3)
GFR calc non Af Amer: 9 mL/min — ABNORMAL LOW (ref >60)
GFR, EST AFRICAN AMERICAN: 11 mL/min — AB (ref >60)
Glucose, Bld: 81 mg/dL (ref 65–99)
POTASSIUM: 4.2 mmol/L (ref 3.5–5.1)
SODIUM: 139 mmol/L (ref 135–145)

## 2015-11-13 LAB — I-STAT TROPONIN, ED: Troponin i, poc: 0.07 ng/mL (ref 0.00–0.08)

## 2015-11-13 MED ORDER — OXYCODONE-ACETAMINOPHEN 5-325 MG PO TABS
1.0000 | ORAL_TABLET | Freq: Once | ORAL | Status: AC
  Administered 2015-11-13: 1 via ORAL
  Filled 2015-11-13: qty 1

## 2015-11-13 MED ORDER — OXYCODONE HCL 5 MG PO TABS: 5.0000 mg | ORAL_TABLET | ORAL | Status: DC | PRN

## 2015-11-13 NOTE — Discharge Instructions (Signed)
Take oxycodone as prescribed as needed for pain. Follow up with primary care doctor, cardiac specialist, pain management specialist.   Nonspecific Chest Pain  Chest pain can be caused by many different conditions. There is always a chance that your pain could be related to something serious, such as a heart attack or a blood clot in your lungs. Chest pain can also be caused by conditions that are not life-threatening. If you have chest pain, it is very important to follow up with your health care provider. CAUSES  Chest pain can be caused by:  Heartburn.  Pneumonia or bronchitis.  Anxiety or stress.  Inflammation around your heart (pericarditis) or lung (pleuritis or pleurisy).  A blood clot in your lung.  A collapsed lung (pneumothorax). It can develop suddenly on its own (spontaneous pneumothorax) or from trauma to the chest.  Shingles infection (varicella-zoster virus).  Heart attack.  Damage to the bones, muscles, and cartilage that make up your chest wall. This can include:  Bruised bones due to injury.  Strained muscles or cartilage due to frequent or repeated coughing or overwork.  Fracture to one or more ribs.  Sore cartilage due to inflammation (costochondritis). RISK FACTORS  Risk factors for chest pain may include:  Activities that increase your risk for trauma or injury to your chest.  Respiratory infections or conditions that cause frequent coughing.  Medical conditions or overeating that can cause heartburn.  Heart disease or family history of heart disease.  Conditions or health behaviors that increase your risk of developing a blood clot.  Having had chicken pox (varicella zoster). SIGNS AND SYMPTOMS Chest pain can feel like:  Burning or tingling on the surface of your chest or deep in your chest.  Crushing, pressure, aching, or squeezing pain.  Dull or sharp pain that is worse when you move, cough, or take a deep breath.  Pain that is also felt  in your back, neck, shoulder, or arm, or pain that spreads to any of these areas. Your chest pain may come and go, or it may stay constant. DIAGNOSIS Lab tests or other studies may be needed to find the cause of your pain. Your health care provider may have you take a test called an ambulatory ECG (electrocardiogram). An ECG records your heartbeat patterns at the time the test is performed. You may also have other tests, such as:  Transthoracic echocardiogram (TTE). During echocardiography, sound waves are used to create a picture of all of the heart structures and to look at how blood flows through your heart.  Transesophageal echocardiogram (TEE).This is a more advanced imaging test that obtains images from inside your body. It allows your health care provider to see your heart in finer detail.  Cardiac monitoring. This allows your health care provider to monitor your heart rate and rhythm in real time.  Holter monitor. This is a portable device that records your heartbeat and can help to diagnose abnormal heartbeats. It allows your health care provider to track your heart activity for several days, if needed.  Stress tests. These can be done through exercise or by taking medicine that makes your heart beat more quickly.  Blood tests.  Imaging tests. TREATMENT  Your treatment depends on what is causing your chest pain. Treatment may include:  Medicines. These may include:  Acid blockers for heartburn.  Anti-inflammatory medicine.  Pain medicine for inflammatory conditions.  Antibiotic medicine, if an infection is present.  Medicines to dissolve blood clots.  Medicines to treat  coronary artery disease.  Supportive care for conditions that do not require medicines. This may include:  Resting.  Applying heat or cold packs to injured areas.  Limiting activities until pain decreases. HOME CARE INSTRUCTIONS  If you were prescribed an antibiotic medicine, finish it all even if  you start to feel better.  Avoid any activities that bring on chest pain.  Do not use any tobacco products, including cigarettes, chewing tobacco, or electronic cigarettes. If you need help quitting, ask your health care provider.  Do not drink alcohol.  Take medicines only as directed by your health care provider.  Keep all follow-up visits as directed by your health care provider. This is important. This includes any further testing if your chest pain does not go away.  If heartburn is the cause for your chest pain, you may be told to keep your head raised (elevated) while sleeping. This reduces the chance that acid will go from your stomach into your esophagus.  Make lifestyle changes as directed by your health care provider. These may include:  Getting regular exercise. Ask your health care provider to suggest some activities that are safe for you.  Eating a heart-healthy diet. A registered dietitian can help you to learn healthy eating options.  Maintaining a healthy weight.  Managing diabetes, if necessary.  Reducing stress. SEEK MEDICAL CARE IF:  Your chest pain does not go away after treatment.  You have a rash with blisters on your chest.  You have a fever. SEEK IMMEDIATE MEDICAL CARE IF:   Your chest pain is worse.  You have an increasing cough, or you cough up blood.  You have severe abdominal pain.  You have severe weakness.  You faint.  You have chills.  You have sudden, unexplained chest discomfort.  You have sudden, unexplained discomfort in your arms, back, neck, or jaw.  You have shortness of breath at any time.  You suddenly start to sweat, or your skin gets clammy.  You feel nauseous or you vomit.  You suddenly feel light-headed or dizzy.  Your heart begins to beat quickly, or it feels like it is skipping beats. These symptoms may represent a serious problem that is an emergency. Do not wait to see if the symptoms will go away. Get medical  help right away. Call your local emergency services (911 in the [place] Do not drive yourself to the hospital.   This information is not intended to replace advice given to you by your health care provider. Make sure you discuss any questions you have with your health care provider.   Document Released: 07/19/2005 Document Revised: 10/30/2014 Document Reviewed: 05/15/2014 Elsevier Interactive Patient Education [employer]

## 2015-11-13 NOTE — ED Provider Notes (Signed)
CSN: [id number]     Arrival date & time 11/13/15  7846 History   First MD Initiated Contact with Patient 11/13/15 [phone]     Chief Complaint  Patient presents with  . Chest Pain  . Shortness of Breath     (Consider location/radiation/quality/duration/timing/severity/associated sxs/prior Treatment) HPI [name] Small is a 25 y.o. female with ESRD, PE, HTN, CHF, presents to ED with complaint of CP. Pt states pain has been there for several days, intermittent, states has become constant since last night. Pain is in the left chest, sharp, does not radiate. Pain is better with sitting up. Patient denies any shortness of breath. Denies any cough.  Reports last dialysis was yesterday, completed. Denies extrimitiy pain or swelling.   Past Medical History  Diagnosis Date  . Hemodialysis patient (HCC)   . Hypertension   . Pulmonary emboli (HCC) 01/2012    Bilateral, moderate clot burden, areas of pulmonary infarction and central necrosis  . CHF (congestive heart failure) (HCC)   . Cardiomyopathy   . Dysrhythmia     at times per pt.  . Anemia   . H/O transfusion of packed red blood cells   . 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 Polycystic kidney disease   . Cellulitis and abscess of face 03/22/2013  . Renal insufficiency   . Sickle cell anemia (HCC)   . Chronic pain   . GERD (gastroesophageal reflux disease)   . Depression   . Hyperkalemia 09/2015  . Pelvic fracture (HCC)   . Osteoporosis    Past Surgical History  Procedure Laterality Date  . Nephrectomy    . Av fistula placement    . Kidney transplant  2008    failed  . Tonsillectomy      as a child.  . Adenoidectomy    . Incision and drainage abscess Right 03/21/2013    Procedure: INCISION AND DRAINAGE RIGHT CHEEK ABSCESS REMOVAL OF FOREIGN BODY;  Surgeon: Serena Colonel, MD;  Location: [facility] OR;  Service: ENT;  Laterality: Right;   . Implantable cardioverter defibrillator implant Right 12/2014   Family History  Problem Relation Age of Onset  . Polycystic kidney disease Father    Social History  Substance Use Topics  . Smoking status: Current Some Day Smoker -- 1.00 packs/day for 1 years    Types: Cigarettes  . Smokeless tobacco: Never Used  . Alcohol Use: No   OB History    No data available     Review of Systems  Constitutional: Negative for fever and chills.  Respiratory: Positive for chest tightness. Negative for cough, shortness of breath and wheezing.   Cardiovascular: Positive for chest pain. Negative for palpitations and leg swelling.  Gastrointestinal: Negative for nausea, vomiting, abdominal pain and diarrhea.  Genitourinary: Negative for dysuria and flank pain.  Musculoskeletal: Negative for myalgias, arthralgias, neck pain and neck stiffness.  Skin: Negative for rash.  Neurological: Negative for dizziness, weakness and headaches.  All other systems reviewed and are negative.     Allergies  Tramadol; Vicodin; Buprenorphine hcl; Iohexol; and Morphine and related  Home Medications   Prior to Admission medications   Medication Sig Start Date End Date Taking? Authorizing Provider  amLODipine (NORVASC) 10 MG tablet Take 10 mg by mouth daily. 09/26/15   Historical Provider, MD  amLODipine (NORVASC) 2.5 MG tablet Take 1 tablet (2.5 mg total) by mouth at bedtime. Patient not  taking: Reported on 10/26/2015 10/12/15   Ripudeep Jenna Luo, MD  carvedilol (COREG) 6.25 MG tablet Take 1 tablet (6.25 mg total) by mouth 2 (two) times daily. 09/25/15   Zannie Cove, MD  cinacalcet (SENSIPAR) 30 MG tablet Take 1 tablet (30 mg total) by mouth daily with supper. Patient taking differently: Take 30 mg by mouth daily with breakfast.  09/25/15   Zannie Cove, MD  isosorbide dinitrate (ISORDIL) 10 MG tablet Take 1 tablet (10 mg total) by mouth 3 (three) times daily. 09/25/15   Zannie Cove, MD  LORazepam (ATIVAN) 1  MG tablet Take 1 tablet (1 mg total) by mouth daily as needed for anxiety. Patient states she takes one tablet by mouth every day per patient 10/12/15   Ripudeep Jenna Luo, MD  multivitamin (RENA-VIT) TABS tablet Take 1 tablet by mouth daily.    Historical Provider, MD  naproxen (NAPROSYN) 500 MG tablet Take 1 tablet (500 mg total) by mouth 2 (two) times daily. 11/07/15   Rolland Porter, MD  oxyCODONE (ROXICODONE) 5 MG immediate release tablet Take 1 tablet (5 mg total) by mouth every 4 (four) hours as needed for severe pain. Patient not taking: Reported on 11/07/2015 10/26/15   [name], DO  sevelamer carbonate (RENVELA) 800 MG tablet Take 3 tablets (2,400 mg total) by mouth 3 (three) times daily with meals. 09/25/15 09/24/16  Zannie Cove, MD   BP 126/74 mmHg  Pulse 53  Temp(Src) 97.9 F (36.6 C) (Oral)  Resp 17  Ht  (1.6 m)  Wt 50 kg  BMI 19.53 kg/m2  SpO2 98% Physical Exam  Constitutional: She is oriented to person, place, and time. She appears well-developed and well-nourished. No distress.  HENT:  Head: Normocephalic.  Eyes: Conjunctivae are normal.  Neck: Neck supple.  Cardiovascular: Normal rate, regular rhythm and normal heart sounds.   Pulmonary/Chest: Effort normal and breath sounds normal. No respiratory distress. She has no wheezes. She has no rales. She exhibits no tenderness.  Abdominal: Soft. Bowel sounds are normal. She exhibits no distension. There is no tenderness. There is no rebound.  Musculoskeletal: She exhibits no edema.  Neurological: She is alert and oriented to person, place, and time.  Skin: Skin is warm and dry.  Psychiatric: She has a normal mood and affect. Her behavior is normal.  Nursing note and vitals reviewed.   ED Course  Procedures (including critical care time) Labs Review Labs Reviewed  BASIC METABOLIC PANEL - Abnormal; Notable for the following:    Chloride 96 (*)    BUN 44 (*)    Creatinine, Ser 5.83 (*)    Calcium 10.9 (*)    GFR calc non  Af Amer 9 (*)    GFR calc Af Amer 11 (*)    Anion gap 18 (*)    All other components within normal limits  CBC - Abnormal; Notable for the following:    RDW 17.3 (*)    All other components within normal limits  I-STAT TROPOININ, ED    Imaging Review Dg Chest 2 View  11/13/2015  CLINICAL DATA:  Left-sided chest pain and shortness breath for 2 days. Hypertension, smoker, history of CHF. EXAM: CHEST  2 VIEW COMPARISON:  Chest x-ray dated 11/07/2015. FINDINGS: Mild cardiomegaly is unchanged. Overall cardiomediastinal silhouette is stable PE in size and configuration. Right chest wall pacemaker/ AICD is stable in position. Lungs appear stable. No evidence of active CHF. No pleural effusion. No pneumothorax seen. Osseous structures about the chest are  unremarkable. IMPRESSION: Stable chest x-ray. No evidence of acute cardiopulmonary abnormality. Electronically Signed   By: Bary Richard M.D.   On: 11/13/2015 07:07   I have personally reviewed and evaluated these images and lab results as part of my medical decision-making.   EKG Interpretation None      MDM   Final diagnoses:  Chest pain, unspecified chest pain type   Patient with chest pain, pain is atypical, does not radiate. Patient has been seen in emergency department several times for similar chest pains. She does have history of CHF with EF of 20-25%. She has a pacemaker defibrillator in place. Patient also has history of coronary disease. Patient's vital signs here are normal. She is in no distress. Records reviewed, recent admission for chest pain just 2 weeks ago. At that time they also obtained blood panel which showed the patient in fact does not have sickle cell disease. Lab work and chest x-ray already obtained upon patient's arrival and are at baseline. Chest x-ray is negative. Patient looked up on controlled substances database, patient with multiple prescriptions from [facility], [place], here. I had a long discussion with  patient, she admitted to me that she used to be in pain management when she lived in [place], but now she does not have a primary care doctor or pain management specialist. She states that every primary care doctor she has tried to go see would not see her anymore because she was noncompliant before. The patient has an appointment set up with someone in few weeks. I will also refer her to cardiology here in [place]. Will give her a few pain management resources as well. She is stable for discharge home at this time. Pain most likely musculoskeletal. No evidence of ACS, CHF, fluid overload. Hom with 6 percocets.   Filed Vitals:   11/13/15 0524 11/13/15 0738  BP: 126/74 117/89  Pulse: 53 103  Temp: 97.9 F (36.6 C)   TempSrc: Oral   Resp: 17 21  Height: 5\' 3"  (1.6 m)   Weight: 50 kg   SpO2: 98% 100%     Jaynie Crumble, PA-C 11/13/15 0817  Benjiman Core, MD 11/14/15 [phone]

## 2015-11-13 NOTE — ED Notes (Signed)
Patient transported to X-ray 

## 2015-11-13 NOTE — ED Notes (Signed)
Patient here with chest pain and shortness of breath, she states that the chest pain never really goes away.  Shortness of breath has subsided.  Patient is a dialysis patient, does dialysis MWF and went yesterday and did not have a full treatment.

## 2015-11-14 ENCOUNTER — Emergency Department (HOSPITAL_COMMUNITY): Payer: Medicare Other

## 2015-11-14 ENCOUNTER — Encounter (HOSPITAL_COMMUNITY): Payer: Self-pay | Admitting: Emergency Medicine

## 2015-11-14 ENCOUNTER — Emergency Department (HOSPITAL_COMMUNITY)
Admission: EM | Admit: 2015-11-14 | Discharge: 2015-11-14 | Disposition: A | Discharge status: Home / Self Care | Payer: Medicare Other | Attending: Emergency Medicine | Admitting: Emergency Medicine

## 2015-11-14 DIAGNOSIS — Z862 Personal history of diseases of the blood and blood-forming organs and certain disorders involving the immune mechanism: Secondary | ICD-10-CM | POA: Diagnosis not present

## 2015-11-14 DIAGNOSIS — E875 Hyperkalemia: Secondary | ICD-10-CM | POA: Diagnosis not present

## 2015-11-14 DIAGNOSIS — Z86718 Personal history of other venous thrombosis and embolism: Secondary | ICD-10-CM | POA: Insufficient documentation

## 2015-11-14 DIAGNOSIS — N186 End stage renal disease: Secondary | ICD-10-CM | POA: Diagnosis not present

## 2015-11-14 DIAGNOSIS — G8929 Other chronic pain: Secondary | ICD-10-CM | POA: Insufficient documentation

## 2015-11-14 DIAGNOSIS — Z872 Personal history of diseases of the skin and subcutaneous tissue: Secondary | ICD-10-CM | POA: Diagnosis not present

## 2015-11-14 DIAGNOSIS — M810 Age-related osteoporosis without current pathological fracture: Secondary | ICD-10-CM | POA: Diagnosis not present

## 2015-11-14 DIAGNOSIS — I120 Hypertensive chronic kidney disease with stage 5 chronic kidney disease or end stage renal disease: Secondary | ICD-10-CM | POA: Diagnosis not present

## 2015-11-14 DIAGNOSIS — Z992 Dependence on renal dialysis: Secondary | ICD-10-CM | POA: Insufficient documentation

## 2015-11-14 DIAGNOSIS — Z79899 Other long term (current) drug therapy: Secondary | ICD-10-CM | POA: Diagnosis not present

## 2015-11-14 DIAGNOSIS — Z791 Long term (current) use of non-steroidal anti-inflammatories (NSAID): Secondary | ICD-10-CM | POA: Diagnosis not present

## 2015-11-14 DIAGNOSIS — I509 Heart failure, unspecified: Secondary | ICD-10-CM | POA: Insufficient documentation

## 2015-11-14 DIAGNOSIS — F329 Major depressive disorder, single episode, unspecified: Secondary | ICD-10-CM | POA: Diagnosis not present

## 2015-11-14 DIAGNOSIS — F17210 Nicotine dependence, cigarettes, uncomplicated: Secondary | ICD-10-CM | POA: Insufficient documentation

## 2015-11-14 DIAGNOSIS — Z8719 Personal history of other diseases of the digestive system: Secondary | ICD-10-CM | POA: Diagnosis not present

## 2015-11-14 DIAGNOSIS — R079 Chest pain, unspecified: Secondary | ICD-10-CM

## 2015-11-14 DIAGNOSIS — Z8781 Personal history of (healed) traumatic fracture: Secondary | ICD-10-CM | POA: Insufficient documentation

## 2015-11-14 LAB — BASIC METABOLIC PANEL
Anion gap: 18 — ABNORMAL HIGH (ref 5–15)
BUN: 68 mg/dL — ABNORMAL HIGH (ref 6–20)
CO2: 24 mmol/L (ref 22–32)
Calcium: 10.7 mg/dL — ABNORMAL HIGH (ref 8.9–10.3)
Chloride: 96 mmol/L — ABNORMAL LOW (ref 101–111)
Creatinine, Ser: 7.77 mg/dL — ABNORMAL HIGH (ref 0.44–1.00)
GFR calc Af Amer: 8 mL/min — ABNORMAL LOW (ref >60)
GFR calc non Af Amer: 7 mL/min — ABNORMAL LOW (ref >60)
Glucose, Bld: 75 mg/dL (ref 65–99)
Potassium: 6.2 mmol/L (ref 3.5–5.1)
Sodium: 138 mmol/L (ref 135–145)

## 2015-11-14 LAB — CBC
HCT: 35.7 % — ABNORMAL LOW (ref 36.0–46.0)
Hemoglobin: 11.4 g/dL — ABNORMAL LOW (ref 12.0–15.0)
MCH: 28.9 pg (ref 26.0–34.0)
MCHC: 31.9 g/dL (ref 30.0–36.0)
MCV: 90.4 fL (ref 78.0–100.0)
Platelets: 245 10*3/uL (ref 150–400)
RBC: 3.95 MIL/uL (ref 3.87–5.11)
RDW: 17.1 % — ABNORMAL HIGH (ref 11.5–15.5)
WBC: 8 10*3/uL (ref 4.0–10.5)

## 2015-11-14 MED ORDER — SODIUM POLYSTYRENE SULFONATE 15 GM/60ML PO SUSP
45.0000 g | Freq: Once | ORAL | Status: AC
  Administered 2015-11-14: 45 g via ORAL
  Filled 2015-11-14: qty 180

## 2015-11-14 MED ORDER — FENTANYL CITRATE (PF) 100 MCG/2ML IJ SOLN
100.0000 ug | Freq: Once | INTRAMUSCULAR | Status: AC
  Administered 2015-11-14: 100 ug via INTRAMUSCULAR
  Filled 2015-11-14: qty 2

## 2015-11-14 NOTE — ED Provider Notes (Signed)
CSN: [id number]     Arrival date & time 11/14/15  1220 History   First MD Initiated Contact with Patient 11/14/15 1224     Chief Complaint  Patient presents with  . Chest Pain     (Consider location/radiation/quality/duration/timing/severity/associated sxs/prior Treatment) HPI Patient presents to the emergency department with chronic chest pain.  This been ongoing for quite a while.  The patient states she always has chest pain but today was concerned so she came to the emergency department.  She has been seen in the emergency department multiple times for this chronic chest pain.  She states the pain never goes away.  She states that this is make the pain better or worse.  She states she did not take any medications prior to arrival.  Mother told EMS that she took all of the medication.  She was given yesterday in the emergency department.  Patient denies nausea, vomiting, weakness, as his headache, blurred vision, back pain, neck pain, fever, cough, dysuria, incontinence, abdominal pain, or syncope Past Medical History  Diagnosis Date  . Hemodialysis patient (HCC)   . Hypertension   . Pulmonary emboli (HCC) 01/2012    Bilateral, moderate clot burden, areas of pulmonary infarction and central necrosis  . CHF (congestive heart failure) (HCC)   . Cardiomyopathy   . Dysrhythmia     at times per pt.  . Anemia   . H/O transfusion of packed red blood cells   . 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 Polycystic kidney disease   . Cellulitis and abscess of face 03/22/2013  . Renal insufficiency   . Chronic pain   . GERD (gastroesophageal reflux disease)   . Depression   . Hyperkalemia 09/2015  . Pelvic fracture (HCC)   . Osteoporosis    Past Surgical History  Procedure Laterality Date  . Nephrectomy    . Av fistula placement    . Kidney transplant  2008    failed  . Tonsillectomy       as a child.  . Adenoidectomy    . Incision and drainage abscess Right 03/21/2013    Procedure: INCISION AND DRAINAGE RIGHT CHEEK ABSCESS REMOVAL OF FOREIGN BODY;  Surgeon: Serena Colonel, MD;  Location: [facility] OR;  Service: ENT;  Laterality: Right;  . Implantable cardioverter defibrillator implant Right 12/2014   Family History  Problem Relation Age of Onset  . Polycystic kidney disease Father    Social History  Substance Use Topics  . Smoking status: Current Some Day Smoker -- 1.00 packs/day for 1 years    Types: Cigarettes  . Smokeless tobacco: Never Used  . Alcohol Use: No   OB History    No data available     Review of Systems  All other systems negative except as documented in the HPI. All pertinent positives and negatives as reviewed in the HPI.  Allergies  Tramadol; Vicodin; Buprenorphine hcl; Iohexol; and Morphine and related  Home Medications   Prior to Admission medications   Medication Sig Start Date End Date Taking? Authorizing Provider  amLODipine (NORVASC) 10 MG tablet Take 10 mg by mouth daily. 09/26/15  Yes Historical Provider, MD  amLODipine (NORVASC) 2.5 MG tablet Take 1 tablet (2.5 mg total) by mouth at bedtime. 10/12/15  Yes Ripudeep Jenna Luo, MD  carvedilol (COREG) 6.25 MG tablet Take 1 tablet (6.25 mg total) by mouth 2 (two) times daily. 09/25/15  Yes Zannie Cove, MD  cinacalcet (SENSIPAR) 30 MG tablet Take 1 tablet (30 mg total) by mouth daily with supper. Patient taking differently: Take 30 mg by mouth daily with breakfast.  09/25/15  Yes Zannie Cove, MD  isosorbide dinitrate (ISORDIL) 10 MG tablet Take 1 tablet (10 mg total) by mouth 3 (three) times daily. 09/25/15  Yes Zannie Cove, MD  LORazepam (ATIVAN) 1 MG tablet Take 1 tablet (1 mg total) by mouth daily as needed for anxiety. Patient states she takes one tablet by mouth every day per patient 10/12/15  Yes Ripudeep K Rai, MD  LORazepam (ATIVAN) 1 MG tablet Take 1 mg by mouth daily as needed for  anxiety.   Yes Historical Provider, MD  multivitamin (RENA-VIT) TABS tablet Take 1 tablet by mouth daily.   Yes Historical Provider, MD  naproxen (NAPROSYN) 500 MG tablet Take 1 tablet (500 mg total) by mouth 2 (two) times daily. 11/07/15  Yes Rolland Porter, MD  oxyCODONE (ROXICODONE) 5 MG immediate release tablet Take 1 tablet (5 mg total) by mouth every 4 (four) hours as needed for severe pain. 11/13/15  Yes Tatyana Kirichenko, PA-C  sevelamer carbonate (RENVELA) 800 MG tablet Take 3 tablets (2,400 mg total) by mouth 3 (three) times daily with meals. 09/25/15 09/24/16 Yes Zannie Cove, MD   BP 117/99 mmHg  Pulse 97  Resp 15  SpO2 95% Physical Exam  Constitutional: She is oriented to person, place, and time. She appears well-developed and well-nourished. No distress.  HENT:  Head: Normocephalic and atraumatic.  Mouth/Throat: Oropharynx is clear and moist.  Eyes: Pupils are equal, round, and reactive to light.  Neck: Normal range of motion. Neck supple.  Cardiovascular: Normal rate, regular rhythm and normal heart sounds.  Exam reveals no gallop and no friction rub.   No murmur heard. Pulmonary/Chest: Effort normal and breath sounds normal. No respiratory distress. She has no wheezes.  Abdominal: Soft. Bowel sounds are normal. She exhibits no distension. There is no tenderness.  Neurological: She is alert and oriented to person, place, and time. She exhibits normal muscle tone. Coordination normal.  Skin: Skin is warm and dry. No rash noted. No erythema.  Psychiatric: She has a normal mood and affect. Her behavior is normal.  Nursing note and vitals reviewed.   ED Course  Procedures (including critical care time) Labs Review Labs Reviewed  BASIC METABOLIC PANEL - Abnormal; Notable for the following:    Potassium 6.2 (*)    Chloride 96 (*)    BUN 68 (*)    Creatinine, Ser 7.77 (*)    Calcium 10.7 (*)    GFR calc non Af Amer 7 (*)    GFR calc Af Amer 8 (*)    Anion gap 18 (*)    All  other components within normal limits  CBC - Abnormal; Notable for the following:    Hemoglobin 11.4 (*)    HCT 35.7 (*)    RDW 17.1 (*)    All other components within normal limits    Imaging Review Dg Chest 2 View  11/14/2015  CLINICAL DATA:  Chest pain EXAM: CHEST - 2 VIEW COMPARISON:  11/13/2015 FINDINGS: Cardiac shadow remains enlarged. A defibrillator is again seen. The lungs are well aerated bilaterally. Mild scarring is noted in the left base stable from the previous exam. No acute bony abnormality is noted. An IVC filter is again seen in position. IMPRESSION: No acute abnormality noted. Mild scarring is noted in the left base. Electronically  Signed   By: Alcide Clever M.D.   On: 11/14/2015 13:45   Dg Chest 2 View  11/13/2015  CLINICAL DATA:  Left-sided chest pain and shortness breath for 2 days. Hypertension, smoker, history of CHF. EXAM: CHEST  2 VIEW COMPARISON:  Chest x-ray dated 11/07/2015. FINDINGS: Mild cardiomegaly is unchanged. Overall cardiomediastinal silhouette is stable PE in size and configuration. Right chest wall pacemaker/ AICD is stable in position. Lungs appear stable. No evidence of active CHF. No pleural effusion. No pneumothorax seen. Osseous structures about the chest are unremarkable. IMPRESSION: Stable chest x-ray. No evidence of acute cardiopulmonary abnormality. Electronically Signed   By: Bary Richard M.D.   On: 11/13/2015 07:07   I have personally reviewed and evaluated these images and lab results as part of my medical decision-making.   EKG Interpretation   Date/Time:  Sunday November 14 2015 12:29:23 EST Ventricular Rate:  89 PR Interval:  260 QRS Duration: 100 QT Interval:  369 QTC Calculation: 449 R Axis:   60 Text Interpretation:  Sinus rhythm Prolonged PR interval Probable left  atrial enlargement LVH with secondary repolarization abnormality No  significant change since last tracing Confirmed by Ethelda Chick  MD, SAM  [phone]) on 11/14/2015  1:25:35 PM      MDM   Final diagnoses:  Chest pain, unspecified chest pain type  Hyperkalemia    I spoke with Dr. Arlean Hopping, of nephrology, who advised to have the patient discharged home, go to her normal dialysis tomorrow morning and to take Kayexalate at home.  The patient has no EKG abnormalities    Charlestine Night, PA-C 11/16/15 0040  Doug Sou, MD 11/16/15 1023

## 2015-11-14 NOTE — ED Notes (Signed)
Per EMs- pt here for chest pain w significant hx of afib, dialysis, internal defib, substance abuse. Pt reports mid chest pain non radiating since Friday. (Dialysis [facility]). Woke up feeling short of breath around 0530 this AM. Pt mother reports [facility] and patient took ALL of the meds she was given yesterday- per mother. Mother thinks it was prescription of Ativan. No IV access.

## 2015-11-14 NOTE — ED Provider Notes (Addendum)
Complains of right anterior chest pain onset yesterday worse with coughing. Associated symptoms include one episode of vomiting yesterday. She denies fever. She admits to coughing up yellowish sputum. No fever. No treatment prior to coming here. On exam no distress lungs clear to auscultation heart regular rate and rhythm. Symptoms consistent with bronchitis chest x-ray and lab work viewed by me.  Doug Sou, MD 11/14/15 1542  Doug Sou, MD 11/14/15 [phone]

## 2015-11-14 NOTE — Discharge Instructions (Signed)
You need to go to your dialysis as scheduled tomorrow morning.

## 2015-11-16 ENCOUNTER — Encounter (HOSPITAL_BASED_OUTPATIENT_CLINIC_OR_DEPARTMENT_OTHER): Payer: Self-pay | Admitting: *Deleted

## 2015-11-16 ENCOUNTER — Emergency Department (HOSPITAL_BASED_OUTPATIENT_CLINIC_OR_DEPARTMENT_OTHER)
Admission: EM | Admit: 2015-11-16 | Discharge: 2015-11-16 | Disposition: A | Discharge status: Home / Self Care | Payer: Medicare Other | Attending: Emergency Medicine | Admitting: Emergency Medicine

## 2015-11-16 DIAGNOSIS — Z87718 Personal history of other specified (corrected) congenital malformations of genitourinary system: Secondary | ICD-10-CM | POA: Insufficient documentation

## 2015-11-16 DIAGNOSIS — F329 Major depressive disorder, single episode, unspecified: Secondary | ICD-10-CM | POA: Diagnosis not present

## 2015-11-16 DIAGNOSIS — Z862 Personal history of diseases of the blood and blood-forming organs and certain disorders involving the immune mechanism: Secondary | ICD-10-CM | POA: Insufficient documentation

## 2015-11-16 DIAGNOSIS — M25551 Pain in right hip: Secondary | ICD-10-CM

## 2015-11-16 DIAGNOSIS — N186 End stage renal disease: Secondary | ICD-10-CM | POA: Diagnosis not present

## 2015-11-16 DIAGNOSIS — Z791 Long term (current) use of non-steroidal anti-inflammatories (NSAID): Secondary | ICD-10-CM | POA: Diagnosis not present

## 2015-11-16 DIAGNOSIS — Z8719 Personal history of other diseases of the digestive system: Secondary | ICD-10-CM | POA: Insufficient documentation

## 2015-11-16 DIAGNOSIS — Z79899 Other long term (current) drug therapy: Secondary | ICD-10-CM | POA: Insufficient documentation

## 2015-11-16 DIAGNOSIS — I120 Hypertensive chronic kidney disease with stage 5 chronic kidney disease or end stage renal disease: Secondary | ICD-10-CM | POA: Insufficient documentation

## 2015-11-16 DIAGNOSIS — Z992 Dependence on renal dialysis: Secondary | ICD-10-CM | POA: Insufficient documentation

## 2015-11-16 DIAGNOSIS — G8929 Other chronic pain: Secondary | ICD-10-CM | POA: Diagnosis not present

## 2015-11-16 DIAGNOSIS — Z8781 Personal history of (healed) traumatic fracture: Secondary | ICD-10-CM | POA: Diagnosis not present

## 2015-11-16 DIAGNOSIS — Z86711 Personal history of pulmonary embolism: Secondary | ICD-10-CM | POA: Insufficient documentation

## 2015-11-16 DIAGNOSIS — Z872 Personal history of diseases of the skin and subcutaneous tissue: Secondary | ICD-10-CM | POA: Diagnosis not present

## 2015-11-16 DIAGNOSIS — F17210 Nicotine dependence, cigarettes, uncomplicated: Secondary | ICD-10-CM | POA: Insufficient documentation

## 2015-11-16 DIAGNOSIS — Z95810 Presence of automatic (implantable) cardiac defibrillator: Secondary | ICD-10-CM | POA: Diagnosis not present

## 2015-11-16 DIAGNOSIS — Z8639 Personal history of other endocrine, nutritional and metabolic disease: Secondary | ICD-10-CM | POA: Diagnosis not present

## 2015-11-16 DIAGNOSIS — I509 Heart failure, unspecified: Secondary | ICD-10-CM | POA: Diagnosis not present

## 2015-11-16 DIAGNOSIS — R079 Chest pain, unspecified: Secondary | ICD-10-CM | POA: Insufficient documentation

## 2015-11-16 LAB — CBC WITH DIFFERENTIAL/PLATELET
BASOS PCT: 0 %
Basophils Absolute: 0 10*3/uL (ref 0.0–0.1)
EOS ABS: 0.1 10*3/uL (ref 0.0–0.7)
Eosinophils Relative: 1 %
HEMATOCRIT: 32.4 % — AB (ref 36.0–46.0)
Hemoglobin: 10.3 g/dL — ABNORMAL LOW (ref 12.0–15.0)
Lymphocytes Relative: 27 %
Lymphs Abs: 2 10*3/uL (ref 0.7–4.0)
MCH: 28.9 pg (ref 26.0–34.0)
MCHC: 31.8 g/dL (ref 30.0–36.0)
MCV: 91 fL (ref 78.0–100.0)
MONO ABS: 0.8 10*3/uL (ref 0.1–1.0)
MONOS PCT: 10 %
Neutro Abs: 4.7 10*3/uL (ref 1.7–7.7)
Neutrophils Relative %: 62 %
Platelets: 217 10*3/uL (ref 150–400)
RBC: 3.56 MIL/uL — ABNORMAL LOW (ref 3.87–5.11)
RDW: 17.1 % — AB (ref 11.5–15.5)
WBC: 7.6 10*3/uL (ref 4.0–10.5)

## 2015-11-16 LAB — BASIC METABOLIC PANEL
Anion gap: 15 (ref 5–15)
BUN: 77 mg/dL — AB (ref 6–20)
CALCIUM: 9.6 mg/dL (ref 8.9–10.3)
CO2: 23 mmol/L (ref 22–32)
CREATININE: 7.89 mg/dL — AB (ref 0.44–1.00)
Chloride: 101 mmol/L (ref 101–111)
GFR calc non Af Amer: 6 mL/min — ABNORMAL LOW (ref >60)
GFR, EST AFRICAN AMERICAN: 7 mL/min — AB (ref >60)
Glucose, Bld: 94 mg/dL (ref 65–99)
Potassium: 5.8 mmol/L — ABNORMAL HIGH (ref 3.5–5.1)
SODIUM: 139 mmol/L (ref 135–145)

## 2015-11-16 MED ORDER — OXYCODONE-ACETAMINOPHEN 5-325 MG PO TABS
1.0000 | ORAL_TABLET | Freq: Once | ORAL | Status: AC
  Administered 2015-11-16: 1 via ORAL
  Filled 2015-11-16: qty 1

## 2015-11-16 MED ORDER — ACETAMINOPHEN 500 MG PO TABS: 1000.0000 mg | ORAL_TABLET | Freq: Four times a day (QID) | ORAL | Status: DC | PRN

## 2015-11-16 NOTE — Discharge Instructions (Signed)
Keep your scheduled follow-up appointment with internal medicine doctor. Her potassium today was 5.8. It is extremely important that he go to dialysis tomorrow.   Chronic Pain Chronic pain can be defined as pain that is off and on and lasts for 3-6 months or longer. Many things cause chronic pain, which can make it difficult to make a diagnosis. There are many treatment options available for chronic pain. However, finding a treatment that works well for you may require trying various approaches until the right one is found. Many people benefit from a combination of two or more types of treatment to control their pain. SYMPTOMS  Chronic pain can occur anywhere in the body and can range from mild to very severe. Some types of chronic pain include:  Headache.  Low back pain.  Cancer pain.  Arthritis pain.  Neurogenic pain. This is pain resulting from damage to nerves. People with chronic pain may also have other symptoms such as:  Depression.  Anger.  Insomnia.  Anxiety. DIAGNOSIS  Your health care provider will help diagnose your condition over time. In many cases, the initial focus will be on excluding possible conditions that could be causing the pain. Depending on your symptoms, your health care provider may order tests to diagnose your condition. Some of these tests may include:   Blood tests.   CT scan.   MRI.   X-rays.   Ultrasounds.   Nerve conduction studies.  You may need to see a specialist.  TREATMENT  Finding treatment that works well may take time. You may be referred to a pain specialist. He or she may prescribe medicine or therapies, such as:   Mindful meditation or yoga.  Shots (injections) of numbing or pain-relieving medicines into the spine or area of pain.  Local electrical stimulation.  Acupuncture.   Massage therapy.   Aroma, color, light, or sound therapy.   Biofeedback.   Working with a physical therapist to keep from getting  stiff.   Regular, gentle exercise.   Cognitive or behavioral therapy.   Group support.  Sometimes, surgery may be recommended.  HOME CARE INSTRUCTIONS   Take all medicines as directed by your health care provider.   Lessen stress in your life by relaxing and doing things such as listening to calming music.   Exercise or be active as directed by your health care provider.   Eat a healthy diet and include things such as vegetables, fruits, fish, and lean meats in your diet.   Keep all follow-up appointments with your health care provider.   Attend a support group with others suffering from chronic pain. SEEK MEDICAL CARE IF:   Your pain gets worse.   You develop a new pain that was not there before.   You cannot tolerate medicines given to you by your health care provider.   You have new symptoms since your last visit with your health care provider.  SEEK IMMEDIATE MEDICAL CARE IF:   You feel weak.   You have decreased sensation or numbness.   You lose control of bowel or bladder function.   Your pain suddenly gets much worse.   You develop shaking.  You develop chills.  You develop confusion.  You develop chest pain.  You develop shortness of breath.  MAKE SURE YOU:  Understand these instructions.  Will watch your condition.  Will get help right away if you are not doing well or get worse.   This information is not intended to replace advice  given to you by your health care provider. Make sure you discuss any questions you have with your health care provider.   Document Released: 07/01/2002 Document Revised: 06/11/2013 Document Reviewed: 04/04/2013 Elsevier Interactive Patient Education [employer].

## 2015-11-16 NOTE — ED Provider Notes (Signed)
CSN: [id number]     Arrival date & time 11/16/15  1914 History   First MD Initiated Contact with Patient 11/16/15 1947     Chief Complaint  Patient presents with  . Joint Pain  . Chest Pain   HPI  [name] Small is a 25 year old patient with a past medical history of end-stage renal disease on dialysis, pulmonary emboli, CHF, cardiomyopathy with defibrillator implanted and osteoporosis presenting with chronic chest pain and joint pain. She states that she has chest pain almost daily. The pain is right-sided and radiates to her back. She states the pain is the same as it usually is but that she "got scared "and decided to come to the emergency department today. She did not say what scared her about her symptoms today. She denies associated shortness of breath, cough, palpitations, diaphoresis or nausea. She has been seen twice in the past 4 days for the same complaint. She denies change in her pain to his previous emergency department visit. She is also complaining of right hip pain. She states that she has osteoporosis and fractured her hip approximately 4 months ago. She states that she has had chronic pain since. She denies recent falls or injuries to the hip. No change in pain recently. She moved from [place] approximately 4 months ago and has not established care with a internal medicine doctor or pain clinic. She is seen frequently in the emergency department for similar presentation. Chart review shows that she was dismissed from a pain clinic for noncompliance. She was recently hospitalized and found to not have sickle cell disease. She states that she has an upcoming orthopedic appointment for her chronic hip pain. Denies fevers, chills, headaches, dizziness, syncope, cough, shortness of breath, neck pain, back pain, abdominal pain, nausea, vomiting or dysuria.  Past Medical History  Diagnosis Date  . Hemodialysis patient (HCC)   . Hypertension   . Pulmonary emboli (HCC) 01/2012    Bilateral,  moderate clot burden, areas of pulmonary infarction and central necrosis  . CHF (congestive heart failure) (HCC)   . Cardiomyopathy   . Dysrhythmia     at times per pt.  . Anemia   . H/O transfusion of packed red blood cells   . 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 Polycystic kidney disease   . Cellulitis and abscess of face 03/22/2013  . Renal insufficiency   . Chronic pain   . GERD (gastroesophageal reflux disease)   . Depression   . Hyperkalemia 09/2015  . Pelvic fracture (HCC)   . Osteoporosis   . Sickle cell anemia [facility])    Past Surgical History  Procedure Laterality Date  . Nephrectomy    . Av fistula placement    . Kidney transplant  2008    failed  . Tonsillectomy      as a child.  . Adenoidectomy    . Incision and drainage abscess Right 03/21/2013    Procedure: INCISION AND DRAINAGE RIGHT CHEEK ABSCESS REMOVAL OF FOREIGN BODY;  Surgeon: Serena Colonel, MD;  Location: [facility] OR;  Service: ENT;  Laterality: Right;  . Implantable cardioverter defibrillator implant Right 12/2014   Family History  Problem Relation Age of Onset  . Polycystic kidney disease Father    Social History  Substance Use Topics  . Smoking status: Current Some Day Smoker -- 0.50 packs/day for 1 years    Types: Cigarettes  .  Smokeless tobacco: Never Used  . Alcohol Use: No   OB History    No data available     Review of Systems  Cardiovascular: Positive for chest pain.  Musculoskeletal: Positive for arthralgias.  All other systems reviewed and are negative.     Allergies  Tramadol; Vicodin; Buprenorphine hcl; Iohexol; and Morphine and related  Home Medications   Prior to Admission medications   Medication Sig Start Date End Date Taking? Authorizing Provider  amLODipine (NORVASC) 10 MG tablet Take 10 mg by mouth daily. 09/26/15  Yes Historical Provider, MD  amLODipine (NORVASC) 2.5  MG tablet Take 1 tablet (2.5 mg total) by mouth at bedtime. 10/12/15  Yes Ripudeep Jenna Luo, MD  carvedilol (COREG) 6.25 MG tablet Take 1 tablet (6.25 mg total) by mouth 2 (two) times daily. 09/25/15  Yes Zannie Cove, MD  cinacalcet (SENSIPAR) 30 MG tablet Take 1 tablet (30 mg total) by mouth daily with supper. Patient taking differently: Take 30 mg by mouth daily with breakfast.  09/25/15  Yes Zannie Cove, MD  isosorbide dinitrate (ISORDIL) 10 MG tablet Take 1 tablet (10 mg total) by mouth 3 (three) times daily. 09/25/15  Yes Zannie Cove, MD  LORazepam (ATIVAN) 1 MG tablet Take 1 tablet (1 mg total) by mouth daily as needed for anxiety. Patient states she takes one tablet by mouth every day per patient 10/12/15  Yes Ripudeep Jenna Luo, MD  multivitamin (RENA-VIT) TABS tablet Take 1 tablet by mouth daily.   Yes Historical Provider, MD  sevelamer carbonate (RENVELA) 800 MG tablet Take 3 tablets (2,400 mg total) by mouth 3 (three) times daily with meals. 09/25/15 09/24/16 Yes Zannie Cove, MD  acetaminophen (TYLENOL) 500 MG tablet Take 2 tablets (1,000 mg total) by mouth 4 (four) times daily as needed. 11/16/15   Yedidya Duddy, PA-C  LORazepam (ATIVAN) 1 MG tablet Take 1 mg by mouth daily as needed for anxiety.    Historical Provider, MD  naproxen (NAPROSYN) 500 MG tablet Take 1 tablet (500 mg total) by mouth 2 (two) times daily. 11/07/15   Rolland Porter, MD  oxyCODONE (ROXICODONE) 5 MG immediate release tablet Take 1 tablet (5 mg total) by mouth every 4 (four) hours as needed for severe pain. 11/13/15   Tatyana Kirichenko, PA-C   BP 127/94 mmHg  Pulse 101  Temp(Src) 98.3 F (36.8 C) (Oral)  Resp 18  Ht 5\' 3"  (1.6 m)  Wt 51 kg  BMI 19.92 kg/m2  SpO2 99% Physical Exam  Constitutional: She appears well-developed and well-nourished. No distress.  No acute distress  HENT:  Head: Normocephalic and atraumatic.  Eyes: Conjunctivae are normal. Right eye exhibits no discharge. Left eye exhibits no  discharge. No scleral icterus.  Neck: Normal range of motion.  Cardiovascular: Normal rate, regular rhythm and normal heart sounds.   Pulmonary/Chest: Effort normal and breath sounds normal. No respiratory distress. She has no wheezes. She has no rales.  Abdominal: Soft. There is no tenderness. There is no rebound and no guarding.  Musculoskeletal: Normal range of motion.       Right hip: She exhibits tenderness. She exhibits normal range of motion, normal strength and no deformity.  Generalized tenderness over the right lateral hip. Patient has full range of motion of the bilateral lower extremities. She is able to ambulate and the emergency department room without instability. No swelling or deformity of the right hip. Patient is a partially spontaneously without pain.  Neurological: She is alert. Coordination normal.  Skin:  Skin is warm and dry.  Psychiatric: She has a normal mood and affect. Her behavior is normal.  Nursing note and vitals reviewed.   ED Course  Procedures (including critical care time) Labs Review Labs Reviewed  CBC WITH DIFFERENTIAL/PLATELET - Abnormal; Notable for the following:    RBC 3.56 (*)    Hemoglobin 10.3 (*)    HCT 32.4 (*)    RDW 17.1 (*)    All other components within normal limits  BASIC METABOLIC PANEL - Abnormal; Notable for the following:    Potassium 5.8 (*)    BUN 77 (*)    Creatinine, Ser 7.89 (*)    GFR calc non Af Amer 6 (*)    GFR calc Af Amer 7 (*)    All other components within normal limits    Imaging Review No results found. I have personally reviewed and evaluated these images and lab results as part of my medical decision-making.   EKG Interpretation   Date/Time:  Tuesday November 16 2015 19:50:57 EST Ventricular Rate:  111 PR Interval:  230 QRS Duration: 86 QT Interval:  314 QTC Calculation: 427 R Axis:   36 Text Interpretation:  Sinus tachycardia with 1st degree A-V block Left  atrial enlargement ST \\T \ T changes  Lateral leads--Unchanged. Abnormal ECG  Confirmed by Fayrene Fearing  MD, MARK ([id number]) on 11/16/2015 7:59:28 PM      MDM   Final diagnoses:  Chronic chest pain  Right hip pain   25 year old female presenting with chronic chest pain and chronic hip pain. She is with the multiple times in this emergency department for same presentation. Denies primary care follow-up or pain management clinic. She does note that she has an appointment next week to establish care. She denies change in her symptoms. Chart review shows that she has similar presentations twice in the last 4 days. Slightly tachycardic which appears to be her baseline. Patient crying frequently during exam. Heart regular rate and rhythm. Lungs clear to auscultation bilaterally. Mild, generalized tenderness of the right lateral hip without deformity. Patient moves all show me spontaneously. All extremities are neurovascularly intact. Patient is able to ambulate in the emergency department. Hemoglobin is at her baseline. Potassium elevated to 5.8. Patient reports going to dialysis yesterday and has an appointment tomorrow. Creatinine is at her baseline. EKG unchanged from visit 2 days ago. Patient repeatedly asks for Dilaudid injections. Discussed with patient that in the emergency department we do not treat chronic pain. Given 1 tablet of Percocet for pain control. Discussed importance of following up with her primary care provider for pain management. Patient had received pain management resources at her visit 4 days ago. Patient states understanding. Discussed with patient that her potassium is 5.8 today. Stressed the importance of making her dialysis appointment tomorrow.  At this time there does not appear to be any evidence of an acute emergency medical condition and the patient appears stable for discharge with appropriate outpatient follow up. Diagnosis was discussed with patient who verbalizes understanding and is agreeable to discharge. Pt case  discussed with Dr. Fayrene Fearing who agrees with my plan. Return precautions given in discharge paperwork and discussed with pt at bedside. Pt is stable for discharge.     Rolm Gala Kellen Dutch, PA-C 11/16/15 2214  Rolland Porter, MD 11/25/15 0010

## 2015-11-16 NOTE — ED Notes (Signed)
Pa  at bedside. 

## 2015-11-16 NOTE — ED Notes (Signed)
Pt reports she has been having chest pain and joint pain since Friday. Reports she has chronic chest pain and a bone disease. ?sickle cell crisis but states she has never been in crisis before

## 2015-12-06 ENCOUNTER — Ambulatory Visit: Payer: Medicare Other | Admitting: Family Medicine

## 2015-12-23 ENCOUNTER — Ambulatory Visit: Payer: Medicare Other | Admitting: Family Medicine

## 2015-12-26 ENCOUNTER — Non-Acute Institutional Stay (HOSPITAL_COMMUNITY)
Admission: EM | Admit: 2015-12-26 | Discharge: 2015-12-27 | Disposition: A | Discharge status: Home / Self Care | Payer: Medicare Other | Attending: Emergency Medicine | Admitting: Emergency Medicine

## 2015-12-26 ENCOUNTER — Emergency Department (HOSPITAL_COMMUNITY): Payer: Medicare Other

## 2015-12-26 ENCOUNTER — Encounter (HOSPITAL_COMMUNITY): Payer: Self-pay | Admitting: *Deleted

## 2015-12-26 DIAGNOSIS — Z95810 Presence of automatic (implantable) cardiac defibrillator: Secondary | ICD-10-CM | POA: Diagnosis not present

## 2015-12-26 DIAGNOSIS — Q613 Polycystic kidney, unspecified: Secondary | ICD-10-CM | POA: Insufficient documentation

## 2015-12-26 DIAGNOSIS — Z791 Long term (current) use of non-steroidal anti-inflammatories (NSAID): Secondary | ICD-10-CM | POA: Diagnosis not present

## 2015-12-26 DIAGNOSIS — F329 Major depressive disorder, single episode, unspecified: Secondary | ICD-10-CM | POA: Insufficient documentation

## 2015-12-26 DIAGNOSIS — Z940 Kidney transplant status: Secondary | ICD-10-CM | POA: Diagnosis not present

## 2015-12-26 DIAGNOSIS — N186 End stage renal disease: Secondary | ICD-10-CM | POA: Diagnosis not present

## 2015-12-26 DIAGNOSIS — Z79899 Other long term (current) drug therapy: Secondary | ICD-10-CM | POA: Insufficient documentation

## 2015-12-26 DIAGNOSIS — F17210 Nicotine dependence, cigarettes, uncomplicated: Secondary | ICD-10-CM | POA: Diagnosis not present

## 2015-12-26 DIAGNOSIS — K219 Gastro-esophageal reflux disease without esophagitis: Secondary | ICD-10-CM | POA: Diagnosis not present

## 2015-12-26 DIAGNOSIS — E875 Hyperkalemia: Secondary | ICD-10-CM | POA: Diagnosis not present

## 2015-12-26 DIAGNOSIS — I509 Heart failure, unspecified: Secondary | ICD-10-CM | POA: Insufficient documentation

## 2015-12-26 DIAGNOSIS — I132 Hypertensive heart and chronic kidney disease with heart failure and with stage 5 chronic kidney disease, or end stage renal disease: Secondary | ICD-10-CM | POA: Diagnosis not present

## 2015-12-26 DIAGNOSIS — R079 Chest pain, unspecified: Secondary | ICD-10-CM | POA: Insufficient documentation

## 2015-12-26 DIAGNOSIS — Z86711 Personal history of pulmonary embolism: Secondary | ICD-10-CM | POA: Diagnosis not present

## 2015-12-26 DIAGNOSIS — Z905 Acquired absence of kidney: Secondary | ICD-10-CM | POA: Insufficient documentation

## 2015-12-26 DIAGNOSIS — D571 Sickle-cell disease without crisis: Secondary | ICD-10-CM | POA: Insufficient documentation

## 2015-12-26 DIAGNOSIS — I429 Cardiomyopathy, unspecified: Secondary | ICD-10-CM | POA: Diagnosis not present

## 2015-12-26 DIAGNOSIS — G8929 Other chronic pain: Secondary | ICD-10-CM | POA: Diagnosis not present

## 2015-12-26 DIAGNOSIS — Z992 Dependence on renal dialysis: Secondary | ICD-10-CM | POA: Diagnosis not present

## 2015-12-26 LAB — BASIC METABOLIC PANEL
ANION GAP: 18 — AB (ref 5–15)
Anion gap: 17 — ABNORMAL HIGH (ref 5–15)
BUN: 73 mg/dL — ABNORMAL HIGH (ref 6–20)
BUN: 75 mg/dL — AB (ref 6–20)
CALCIUM: 10.2 mg/dL (ref 8.9–10.3)
CALCIUM: 9.9 mg/dL (ref 8.9–10.3)
CO2: 24 mmol/L (ref 22–32)
CO2: 23 mmol/L (ref 22–32)
CREATININE: 10.58 mg/dL — AB (ref 0.44–1.00)
CREATININE: 10.79 mg/dL — AB (ref 0.44–1.00)
Chloride: 94 mmol/L — ABNORMAL LOW (ref 101–111)
Chloride: 95 mmol/L — ABNORMAL LOW (ref 101–111)
GFR calc non Af Amer: 5 mL/min — ABNORMAL LOW (ref >60)
GFR, EST AFRICAN AMERICAN: 5 mL/min — AB (ref >60)
GFR, EST AFRICAN AMERICAN: 5 mL/min — AB (ref >60)
GFR, EST NON AFRICAN AMERICAN: 4 mL/min — AB (ref >60)
Glucose, Bld: 68 mg/dL (ref 65–99)
Glucose, Bld: 73 mg/dL (ref 65–99)
Potassium: >7.5 mmol/L (ref 3.5–5.1)
Potassium: >7.5 mmol/L (ref 3.5–5.1)
SODIUM: 136 mmol/L (ref 135–145)
SODIUM: 135 mmol/L (ref 135–145)

## 2015-12-26 LAB — CBC
HCT: 37.4 % (ref 36.0–46.0)
HEMOGLOBIN: 11.8 g/dL — AB (ref 12.0–15.0)
MCH: 28.4 pg (ref 26.0–34.0)
MCHC: 31.6 g/dL (ref 30.0–36.0)
MCV: 90.1 fL (ref 78.0–100.0)
PLATELETS: 190 10*3/uL (ref 150–400)
RBC: 4.15 MIL/uL (ref 3.87–5.11)
RDW: 15.6 % — ABNORMAL HIGH (ref 11.5–15.5)
WBC: 7.8 10*3/uL (ref 4.0–10.5)

## 2015-12-26 LAB — CBG MONITORING, ED
GLUCOSE-CAPILLARY: 22 mg/dL — AB (ref 65–99)
Glucose-Capillary: 119 mg/dL — ABNORMAL HIGH (ref 65–99)

## 2015-12-26 LAB — I-STAT TROPONIN, ED: TROPONIN I, POC: 0.1 ng/mL — AB (ref 0.00–0.08)

## 2015-12-26 LAB — POTASSIUM: Potassium: 4.4 mmol/L (ref 3.5–5.1)

## 2015-12-26 MED ORDER — DEXTROSE 50 % IV SOLN
INTRAVENOUS | Status: AC
  Filled 2015-12-26: qty 50

## 2015-12-26 MED ORDER — HYDROMORPHONE HCL 1 MG/ML IJ SOLN
1.0000 mg | Freq: Once | INTRAMUSCULAR | Status: AC
Start: 2015-12-26 — End: 2015-12-26
  Administered 2015-12-26: 1 mg via INTRAVENOUS
  Filled 2015-12-26: qty 1

## 2015-12-26 MED ORDER — PENTAFLUOROPROP-TETRAFLUOROETH EX AERO
1.0000 "application " | INHALATION_SPRAY | CUTANEOUS | Status: DC | PRN
  Filled 2015-12-26: qty 30

## 2015-12-26 MED ORDER — INSULIN ASPART 100 UNIT/ML IV SOLN
10.0000 [IU] | Freq: Once | INTRAVENOUS | Status: AC
  Administered 2015-12-26: 10 [IU] via INTRAVENOUS
  Filled 2015-12-26: qty 1

## 2015-12-26 MED ORDER — HEPARIN SODIUM (PORCINE) 1000 UNIT/ML DIALYSIS
1000.0000 [IU] | INTRAMUSCULAR | Status: DC | PRN
  Filled 2015-12-26: qty 1

## 2015-12-26 MED ORDER — SODIUM CHLORIDE 0.9 % IV SOLN: 100.0000 mL | INTRAVENOUS | Status: DC | PRN

## 2015-12-26 MED ORDER — DEXTROSE 50 % IV SOLN
50.0000 mL | Freq: Once | INTRAVENOUS | Status: AC
  Administered 2015-12-26: 50 mL via INTRAVENOUS
  Filled 2015-12-26: qty 50

## 2015-12-26 MED ORDER — HEPARIN SODIUM (PORCINE) 1000 UNIT/ML DIALYSIS
20.0000 [IU]/kg | INTRAMUSCULAR | Status: DC | PRN
  Filled 2015-12-26: qty 2

## 2015-12-26 MED ORDER — LIDOCAINE HCL (PF) 1 % IJ SOLN: 5.0000 mL | INTRAMUSCULAR | Status: DC | PRN

## 2015-12-26 MED ORDER — HYDROMORPHONE HCL 1 MG/ML IJ SOLN
1.0000 mg | Freq: Once | INTRAMUSCULAR | Status: AC
  Administered 2015-12-26: 1 mg via INTRAVENOUS
  Filled 2015-12-26: qty 1

## 2015-12-26 MED ORDER — DIPHENHYDRAMINE HCL 50 MG/ML IJ SOLN
12.5000 mg | Freq: Once | INTRAMUSCULAR | Status: AC
  Administered 2015-12-26: 12.5 mg via INTRAVENOUS
  Filled 2015-12-26: qty 1

## 2015-12-26 MED ORDER — SODIUM CHLORIDE 0.9 % IV SOLN
1.0000 g | Freq: Once | INTRAVENOUS | Status: AC
  Administered 2015-12-26: 1 g via INTRAVENOUS
  Filled 2015-12-26: qty 10

## 2015-12-26 MED ORDER — ALTEPLASE 2 MG IJ SOLR
2.0000 mg | Freq: Once | INTRAMUSCULAR | Status: DC | PRN
  Filled 2015-12-26: qty 2

## 2015-12-26 MED ORDER — LIDOCAINE-PRILOCAINE 2.5-2.5 % EX CREA
1.0000 "application " | TOPICAL_CREAM | CUTANEOUS | Status: DC | PRN
  Filled 2015-12-26: qty 5

## 2015-12-26 MED ORDER — PROMETHAZINE HCL 25 MG/ML IJ SOLN
12.5000 mg | Freq: Once | INTRAMUSCULAR | Status: AC
  Administered 2015-12-26: 12.5 mg via INTRAVENOUS
  Filled 2015-12-26: qty 1

## 2015-12-26 MED ORDER — DEXTROSE 50 % IV SOLN
50.0000 mL | Freq: Once | INTRAVENOUS | Status: AC
  Administered 2015-12-26: 50 mL via INTRAVENOUS

## 2015-12-26 NOTE — ED Provider Notes (Signed)
CSN: [id number]     Arrival date & time 12/26/15  1413 History   First MD Initiated Contact with Patient 12/26/15 1521     Chief Complaint  Patient presents with  . 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 She also has a history of frequent visits for chest pain. She presents today with complaints of chest pain that is located in the center of her chest. She describes this as sharp with no radiation, nausea, diaphoresis, or shortness of breath. She denies any injury or trauma. She denies any fevers, chills, or cough. She was last dialyzed 2 days ago at an outside facility.  Patient is a 25 y.o. female presenting with chest pain. The history is provided by the patient.  Chest Pain Pain location:  Substernal area Pain quality: sharp   Pain radiates to:  Does not radiate Pain radiates to the back: no   Pain severity:  Moderate Onset quality:  Sudden Duration:  6 hours Timing:  Constant Progression:  Unchanged Chronicity:  Chronic Relieved by:  Nothing Worsened by:  Nothing tried Ineffective treatments:  None tried   Past Medical History  Diagnosis Date  . Hemodialysis patient (HCC)   . Hypertension   . Pulmonary emboli (HCC) 01/2012    Bilateral, moderate clot burden, areas of pulmonary infarction and central necrosis  . CHF (congestive heart failure) (HCC)   . Cardiomyopathy   . Dysrhythmia     at times per pt.  . Anemia   . H/O transfusion of packed red blood cells   . 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 Polycystic kidney disease   . Cellulitis and abscess of face 03/22/2013  . Renal insufficiency   . Chronic pain   . GERD (gastroesophageal reflux disease)   . Depression   .  Hyperkalemia 09/2015  . Pelvic fracture (HCC)   . Osteoporosis   . Sickle cell anemia [facility])    Past Surgical History  Procedure Laterality Date  . Nephrectomy    . Av fistula placement    . Kidney transplant  2008    failed  . Tonsillectomy      as a child.  . Adenoidectomy    . Incision and drainage abscess Right 03/21/2013    Procedure: INCISION AND DRAINAGE RIGHT CHEEK ABSCESS REMOVAL OF FOREIGN BODY;  Surgeon: Serena Colonel, MD;  Location: [facility] OR;  Service: ENT;  Laterality: Right;  . Implantable cardioverter defibrillator implant Right 12/2014   Family History  Problem Relation Age of Onset  . Polycystic kidney disease Father    Social History  Substance Use Topics  . Smoking status: Current Some Day Smoker -- 0.50 packs/day for 1 years    Types: Cigarettes  . Smokeless tobacco: Never Used  . Alcohol Use: No   OB History    No data available     Review of Systems  Cardiovascular: Positive for chest pain.  All other systems reviewed and are negative.     Allergies  Tramadol; Vicodin; Buprenorphine hcl; Iohexol; and Morphine and related  Home Medications   Prior to Admission medications   Medication Sig Start Date End Date Taking? Authorizing Provider  acetaminophen (TYLENOL) 500 MG tablet Take 2 tablets (1,000 mg total) by mouth 4 (four) times daily as needed. 11/16/15  Stevi Barrett, PA-C  amLODipine (NORVASC) 10 MG tablet Take 10 mg by mouth daily. 09/26/15   Historical Provider, MD  amLODipine (NORVASC) 2.5 MG tablet Take 1 tablet (2.5 mg total) by mouth at bedtime. 10/12/15   Ripudeep Jenna Luo, MD  carvedilol (COREG) 6.25 MG tablet Take 1 tablet (6.25 mg total) by mouth 2 (two) times daily. 09/25/15   Zannie Cove, MD  cinacalcet (SENSIPAR) 30 MG tablet Take 1 tablet (30 mg total) by mouth daily with supper. Patient taking differently: Take 30 mg by mouth daily with breakfast.  09/25/15   Zannie Cove, MD  isosorbide dinitrate (ISORDIL) 10 MG tablet Take 1  tablet (10 mg total) by mouth 3 (three) times daily. 09/25/15   Zannie Cove, MD  LORazepam (ATIVAN) 1 MG tablet Take 1 tablet (1 mg total) by mouth daily as needed for anxiety. Patient states she takes one tablet by mouth every day per patient 10/12/15   Ripudeep Jenna Luo, MD  LORazepam (ATIVAN) 1 MG tablet Take 1 mg by mouth daily as needed for anxiety.    Historical Provider, MD  multivitamin (RENA-VIT) TABS tablet Take 1 tablet by mouth daily.    Historical Provider, MD  naproxen (NAPROSYN) 500 MG tablet Take 1 tablet (500 mg total) by mouth 2 (two) times daily. 11/07/15   Rolland Porter, MD  oxyCODONE (ROXICODONE) 5 MG immediate release tablet Take 1 tablet (5 mg total) by mouth every 4 (four) hours as needed for severe pain. 11/13/15   Tatyana Kirichenko, PA-C  sevelamer carbonate (RENVELA) 800 MG tablet Take 3 tablets (2,400 mg total) by mouth 3 (three) times daily with meals. 09/25/15 09/24/16  Zannie Cove, MD   BP 151/118 mmHg  Pulse 91  Temp(Src) 98.5 F (36.9 C) (Oral)  Resp 18  SpO2 100% Physical Exam  Constitutional: She is oriented to person, place, and time. She appears well-developed and well-nourished. No distress.  HENT:  Head: Normocephalic and atraumatic.  Neck: Normal range of motion. Neck supple.  Cardiovascular: Normal rate and regular rhythm.  Exam reveals no gallop and no friction rub.   No murmur heard. Pulmonary/Chest: Effort normal and breath sounds normal. No respiratory distress. She has no wheezes. She has no rales. She exhibits tenderness.  There is tenderness to palpation to the anterior chest wall. This reproduces her discomfort.  Abdominal: Soft. Bowel sounds are normal. She exhibits no distension. There is no tenderness.  Musculoskeletal: Normal range of motion.  Neurological: She is alert and oriented to person, place, and time.  Skin: Skin is warm and dry. She is not diaphoretic.  Nursing note and vitals reviewed.   ED Course  Procedures (including  critical care time) Labs Review Labs Reviewed  I-STAT TROPOININ, ED - Abnormal; Notable for the following:    Troponin i, poc 0.10 (*)    All other components within normal limits  BASIC METABOLIC PANEL  CBC    Imaging Review Dg Chest 2 View  12/26/2015  CLINICAL DATA:  Tight squeezing, midsternal CP began this morning; feels worse upon a deep breath.CHF; Pt has had defibrillator for one year; HTN meds EXAM: CHEST - 2 VIEW COMPARISON:  12/21/2015 and previous FINDINGS: Stable position of right subclavian AICD. Stable cardiomegaly. Subsegmental atelectasis or scarring laterally in the lingula. Lungs otherwise clear. No pneumothorax. No effusion. Visualized skeletal structures are unremarkable. IVC filter at the L3 level. IMPRESSION: 1. Stable cardiomegaly and postop changes.  No acute disease. Electronically Signed   By: Ronald Pippins.D.  On: 12/26/2015 14:58   I have personally reviewed and evaluated these images and lab results as part of my medical decision-making.  ED ECG REPORT   Date: 12/26/2015  Rate: 96  Rhythm: normal sinus rhythm  QRS Axis: right  Intervals: PR prolonged  ST/T Wave abnormalities: nonspecific T wave changes  Conduction Disutrbances:none  Narrative Interpretation:   Old EKG Reviewed: unchanged  I have personally reviewed the EKG tracing and agree with the computerized printout as noted.   MDM   Final diagnoses:  None    Patient with history of end-stage renal disease related to polycystic kidneys. She presents today with complaints of chest discomfort. She reports to me she was dialyzed 2 days ago, however her potassium today is elevated at greater than 7.5. Her EKG does not show any definite changes that would be consistent with this. I discussed these results with Dr. Hyman Hopes from nephrology who is recommending repeating the BMP to ensure that this was an accurate result. There was no visible hemolysis on the initial specimen and the repeat specimen was  greater than 7.5 on the potassium. She was given the appropriate medications to lower the potassium and she will go to nephrology for dialysis. I've spoken with Dr. Kathrene Bongo who agrees to accept.  CRITICAL CARE Performed by: Geoffery Lyons Total critical care time: 45 minutes Critical care time was exclusive of separately billable procedures and treating other patients. Critical care was necessary to treat or prevent imminent or life-threatening deterioration. Critical care was time spent personally by me on the following activities: development of treatment plan with patient and/or surrogate as well as nursing, discussions with consultants, evaluation of patient's response to treatment, examination of patient, obtaining history from patient or surrogate, ordering and performing treatments and interventions, ordering and review of laboratory studies, ordering and review of radiographic studies, pulse oximetry and re-evaluation of patient's condition.     Geoffery Lyons, MD 12/26/15 [phone]

## 2015-12-26 NOTE — ED Notes (Signed)
Critical Lab Value   K+ greater than 7.5  Dr. Judd Lien made aware

## 2015-12-26 NOTE — ED Notes (Signed)
Pt reports mid chest pain this am. Had n/v and sob this am. Last dialysis was Friday.

## 2015-12-26 NOTE — ED Notes (Signed)
This RN assessed CBG, found to be 22.  Pt Aox4, states feels "fine, just a little shaky".  Pt given 8 fl oz juice (apple per pt request).  Dr. Judd Lien notified, verbal order for D50 received.  Pt also given graham crackers  And a sandwich.

## 2015-12-26 NOTE — Discharge Summary (Signed)
Physician Discharge Summary  Patient ID: [name] Small MRN: [id number] DOB/AGE: 17-Sep-1991 24 y.o.  Admit date: 12/26/2015 Discharge date: 12/26/2015  Admission Diagnoses:  Discharge Diagnoses:  Active Problems:   Hyperkalemia   Discharged Condition: fair  Hospital Course: 25 year old with ESRD presented with CP and hyperkalemia- she underwent emergent HD for potassium and it corrected- had a troponin that was WNL- she was discharged after HD  in stable condition- will attempt to help her find an OP unit once discharged   Consults: None  Significant Diagnostic Studies: labs: potassium of greater than 7.5  Treatments: dialysis: Hemodialysis  Discharge Exam: Blood pressure 144/99, pulse 77, temperature 98.5 F (36.9 C), temperature source Oral, resp. rate 13, SpO2 97 %. General appearance: alert and no distress Resp: clear to auscultation bilaterally Cardio: regular rate and rhythm, S1, S2 normal, no murmur, click, rub or gallop GI: soft, non-tender; bowel sounds normal; no masses,  no organomegaly Extremities: extremities normal, atraumatic, no cyanosis or edema AVF   Disposition: 01-Home or Self Care     Medication List    ASK your doctor about these medications        acetaminophen 500 MG tablet  Commonly known as:  TYLENOL  Take 2 tablets (1,000 mg total) by mouth 4 (four) times daily as needed.     amLODipine 10 MG tablet  Commonly known as:  NORVASC  Take 10 mg by mouth daily.     amLODipine 2.5 MG tablet  Commonly known as:  NORVASC  Take 1 tablet (2.5 mg total) by mouth at bedtime.     carvedilol 6.25 MG tablet  Commonly known as:  COREG  Take 1 tablet (6.25 mg total) by mouth 2 (two) times daily.     cinacalcet 30 MG tablet  Commonly known as:  SENSIPAR  Take 1 tablet (30 mg total) by mouth daily with supper.     isosorbide dinitrate 10 MG tablet  Commonly known as:  ISORDIL  Take 1 tablet (10 mg total) by mouth 3 (three) times daily.     LORazepam 1 MG tablet  Commonly known as:  ATIVAN  Take 1 tablet (1 mg total) by mouth daily as needed for anxiety. Patient states she takes one tablet by mouth every day per patient     multivitamin Tabs tablet  Take 1 tablet by mouth daily.     naproxen 500 MG tablet  Commonly known as:  NAPROSYN  Take 1 tablet (500 mg total) by mouth 2 (two) times daily.     oxyCODONE 5 MG immediate release tablet  Commonly known as:  ROXICODONE  Take 1 tablet (5 mg total) by mouth every 4 (four) hours as needed for severe pain.     sertraline 100 MG tablet  Commonly known as:  ZOLOFT  Take 100 mg by mouth daily.     sevelamer carbonate 800 MG tablet  Commonly known as:  RENVELA  Take 3 tablets (2,400 mg total) by mouth 3 (three) times daily with meals.         SignedAnnie Sable A 12/26/2015, 7:36 PM

## 2015-12-26 NOTE — H&P (Signed)
Short History and Physical Form [facility]  12/26/2015,7:23 PM    HPI:   [name] Small is an 25 y.o. female with sickle cell anemia and ESRD, HTN, CHF with ICD with what appears to be also many trips to the ER- she says that she had HD on Saturday- her home unit was in  [place] but then she moved to [place] for only a short time and is back and waiting to get re established with her [place] unit- hopes to hear something this week.    Presents here today with CP- troponin was negative but K was over 7.5 so we are to provide emergent HD for her    Dialyzes in between units right now- see above    Past Medical History  Diagnosis Date  . Hemodialysis patient ([place])   . Hypertension   . Pulmonary emboli (HCC) 01/2012    Bilateral, moderate clot burden, areas of pulmonary infarction and central necrosis  . CHF (congestive heart failure) ([place])   . Cardiomyopathy   . Dysrhythmia     at times per pt.  . Anemia   . H/O transfusion of packed red blood cells   . 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 Polycystic kidney disease   . Cellulitis and abscess of face 03/22/2013  . Renal insufficiency   . Chronic pain   . GERD (gastroesophageal reflux disease)   . Depression   . Hyperkalemia 09/2015  . Pelvic fracture ([place])   . Osteoporosis   . Sickle cell anemia (HCC)    Allergies  Allergen Reactions  . Tramadol Anaphylaxis  . Vicodin [Hydrocodone-Acetaminophen] Hives  . Buprenorphine Hcl Itching    Ok with oxycodone  . Iohexol Itching  . Morphine And Related Itching    Ok with oxycodone   Prior to Admission medications   Medication Sig Start Date End Date Taking? Authorizing Provider  amLODipine (NORVASC) 10 MG tablet Take 10 mg by mouth daily. 09/26/15  Yes Historical Provider, MD  carvedilol (COREG) 6.25 MG tablet Take 1 tablet (6.25 mg total) by mouth 2 (two)  times daily. 09/25/15  Yes Domenic Polite, MD  cinacalcet (SENSIPAR) 30 MG tablet Take 1 tablet (30 mg total) by mouth daily with supper. Patient taking differently: Take 30 mg by mouth daily with breakfast.  09/25/15  Yes Domenic Polite, MD  isosorbide dinitrate (ISORDIL) 10 MG tablet Take 1 tablet (10 mg total) by mouth 3 (three) times daily. 09/25/15  Yes Domenic Polite, MD  LORazepam (ATIVAN) 1 MG tablet Take 1 tablet (1 mg total) by mouth daily as needed for anxiety. Patient states she takes one tablet by mouth every day per patient 10/12/15  Yes Ripudeep Krystal Eaton, MD  multivitamin (RENA-VIT) TABS tablet Take 1 tablet by mouth daily.   Yes Historical Provider, MD  naproxen (NAPROSYN) 500 MG tablet Take 1 tablet (500 mg total) by mouth 2 (two) times daily. 11/07/15  Yes Tanna Furry, MD  oxyCODONE (ROXICODONE) 5 MG immediate release tablet Take 1 tablet (5 mg total) by mouth every 4 (four) hours as needed for severe pain. 11/13/15  Yes Tatyana Kirichenko, PA-C  sertraline (ZOLOFT) 100 MG tablet Take 100 mg by mouth daily.   Yes Historical Provider, MD  sevelamer carbonate (RENVELA) 800 MG tablet Take 3 tablets (2,400 mg total) by mouth 3 (three) times daily with meals. 09/25/15 09/24/16 Yes Domenic Polite, MD  acetaminophen (TYLENOL) 500 MG tablet Take 2 tablets (1,000 mg total) by mouth 4 (four) times daily as needed. Patient not taking: Reported on 12/26/2015 11/16/15   Lahoma Crocker Barrett, PA-C  amLODipine (NORVASC) 2.5 MG tablet Take 1 tablet (2.5 mg total) by mouth at bedtime. Patient not taking: Reported on 12/26/2015 10/12/15   Ripudeep Krystal Eaton, MD   Results for orders placed or performed during the hospital encounter of 12/26/15 (from the past 48 hour(s))  Basic metabolic panel     Status: Abnormal   Collection Time: 12/26/15  2:38 PM  Result Value Ref Range   Sodium 136 135 - 145 mmol/L   Potassium >7.5 (HH) 3.5 - 5.1 mmol/L    Comment: NO VISIBLE HEMOLYSIS CRITICAL RESULT CALLED TO, READ BACK BY AND  VERIFIED WITH: KING,C RN 12/26/15 1544 WOOTEN,K    Chloride 94 (L) 101 - 111 mmol/L   CO2 24 22 - 32 mmol/L   Glucose, Bld 68 65 - 99 mg/dL   BUN 73 (H) 6 - 20 mg/dL   Creatinine, Ser 10.58 (H) 0.44 - 1.00 mg/dL   Calcium 10.2 8.9 - 10.3 mg/dL   GFR calc non Af Amer 5 (L) >60 mL/min   GFR calc Af Amer 5 (L) >60 mL/min    Comment: (NOTE) The eGFR has been calculated using the CKD EPI equation. This calculation has not been validated in all clinical situations. eGFR's persistently <60 mL/min signify possible Chronic Kidney Disease.    Anion gap 18 (H) 5 - 15  CBC     Status: Abnormal   Collection Time: 12/26/15  2:38 PM  Result Value Ref Range   WBC 7.8 4.0 - 10.5 K/uL   RBC 4.15 3.87 - 5.11 MIL/uL   Hemoglobin 11.8 (L) 12.0 - 15.0 g/dL   HCT 37.4 36.0 - 46.0 %   MCV 90.1 78.0 - 100.0 fL   MCH 28.4 26.0 - 34.0 pg   MCHC 31.6 30.0 - 36.0 g/dL   RDW 15.6 (H) 11.5 - 15.5 %   Platelets 190 150 - 400 K/uL  I-stat troponin, ED (not at [facility], [facility])     Status: Abnormal   Collection Time: 12/26/15  3:04 PM  Result Value Ref Range   Troponin i, poc 0.10 (HH) 0.00 - 0.08 ng/mL   Comment NOTIFIED PHYSICIAN    Comment 3            Comment: Due to the release kinetics of cTnI, a negative result within the first hours of the onset of symptoms does not rule out myocardial infarction with certainty. If myocardial infarction is still suspected, repeat the test at appropriate intervals.   Basic metabolic panel     Status: Abnormal   Collection Time: 12/26/15  4:08 PM  Result Value Ref Range   Sodium 135 135 - 145 mmol/L   Potassium >7.5 (HH) 3.5 - 5.1 mmol/L    Comment: SLIGHT HEMOLYSIS CRITICAL RESULT CALLED TO, READ BACK BY AND VERIFIED WITH: [facility] [id number] [place]    Chloride 95 (L) 101 - 111 mmol/L   CO2 23 22 - 32 mmol/L   Glucose, Bld 73 65 - 99 mg/dL   BUN 75 (H) 6 - 20 mg/dL   Creatinine, Ser 10.79 (H) 0.44 - 1.00 mg/dL   Calcium 9.9 8.9 - 10.3 mg/dL   GFR calc non Af  Amer 4 (L) >60 mL/min   GFR calc Af Amer 5 (L) >60 mL/min    Comment: (NOTE) The eGFR has  been calculated using the CKD EPI equation. This calculation has not been validated in all clinical situations. eGFR's persistently <60 mL/min signify possible Chronic Kidney Disease.    Anion gap 17 (H) 5 - 15  CBG monitoring, ED     Status: Abnormal   Collection Time: 12/26/15  6:07 PM  Result Value Ref Range   Glucose-Capillary 22 (LL) 65 - 99 mg/dL   Comment 1 Notify RN   CBG monitoring, ED     Status: Abnormal   Collection Time: 12/26/15  6:42 PM  Result Value Ref Range   Glucose-Capillary 119 (H) 65 - 99 mg/dL   Dg Chest 2 View  12/26/2015  CLINICAL DATA:  Tight squeezing, midsternal CP began this morning; feels worse upon a deep breath.CHF; Pt has had defibrillator for one year; HTN meds EXAM: CHEST - 2 VIEW COMPARISON:  12/21/2015 and previous FINDINGS: Stable position of right subclavian AICD. Stable cardiomegaly. Subsegmental atelectasis or scarring laterally in the lingula. Lungs otherwise clear. No pneumothorax. No effusion. Visualized skeletal structures are unremarkable. IVC filter at the L3 level. IMPRESSION: 1. Stable cardiomegaly and postop changes.  No acute disease. Electronically Signed   By: Lucrezia Europe M.D.   On: 12/26/2015 14:58    Physical Exam: Filed Vitals:   12/26/15 1900 12/26/15 1915  BP: 143/91 144/99  Pulse: 107 77  Temp:    Resp: 34 13   General: NAD Heart: RRR Lungs: some decreased BS at bases Abdomen: soft, non tender  Extremities: minimal edema Access: left AVF   Assessment/Plan: 25 year old female with multiple medical problems including ESRD- presents with CP and hyperkalemia  1 Hyperkalemia- necessitates emergent HD tonight 2 ESRD: after tonight will see if I can be of any assist to get her reestablished with the HD unit in [place] so she can get a treatment later in the week  3. Sickle cell anemia- hgb over 11 ?    Berneta Sconyers A    12/26/2015, 7:23 PM

## 2015-12-26 NOTE — ED Notes (Signed)
Critical Lab Value:  K+  >7.5

## 2015-12-26 NOTE — ED Notes (Signed)
Attempted to call report

## 2015-12-27 NOTE — Progress Notes (Signed)
Per sarah RN patient is to be discharged from HD, provided taxi voucher from house coverage. Treatment ended goal not met due to signed off early via [place], removed iv from right fore arm. Patient left unit left unit alert, oriented ambulatory and vitals stable.

## 2015-12-28 ENCOUNTER — Emergency Department (HOSPITAL_BASED_OUTPATIENT_CLINIC_OR_DEPARTMENT_OTHER)
Admission: EM | Admit: 2015-12-28 | Discharge: 2015-12-28 | Disposition: A | Discharge status: Home / Self Care | Payer: Medicare Other | Attending: Emergency Medicine | Admitting: Emergency Medicine

## 2015-12-28 ENCOUNTER — Emergency Department (HOSPITAL_BASED_OUTPATIENT_CLINIC_OR_DEPARTMENT_OTHER): Payer: Medicare Other

## 2015-12-28 ENCOUNTER — Encounter (HOSPITAL_BASED_OUTPATIENT_CLINIC_OR_DEPARTMENT_OTHER): Payer: Self-pay

## 2015-12-28 DIAGNOSIS — I120 Hypertensive chronic kidney disease with stage 5 chronic kidney disease or end stage renal disease: Secondary | ICD-10-CM | POA: Insufficient documentation

## 2015-12-28 DIAGNOSIS — Z992 Dependence on renal dialysis: Secondary | ICD-10-CM | POA: Insufficient documentation

## 2015-12-28 DIAGNOSIS — G8929 Other chronic pain: Secondary | ICD-10-CM | POA: Insufficient documentation

## 2015-12-28 DIAGNOSIS — Z791 Long term (current) use of non-steroidal anti-inflammatories (NSAID): Secondary | ICD-10-CM | POA: Diagnosis not present

## 2015-12-28 DIAGNOSIS — Z86711 Personal history of pulmonary embolism: Secondary | ICD-10-CM | POA: Insufficient documentation

## 2015-12-28 DIAGNOSIS — E875 Hyperkalemia: Secondary | ICD-10-CM | POA: Diagnosis not present

## 2015-12-28 DIAGNOSIS — N186 End stage renal disease: Secondary | ICD-10-CM | POA: Diagnosis not present

## 2015-12-28 DIAGNOSIS — Z8781 Personal history of (healed) traumatic fracture: Secondary | ICD-10-CM | POA: Diagnosis not present

## 2015-12-28 DIAGNOSIS — I509 Heart failure, unspecified: Secondary | ICD-10-CM | POA: Diagnosis not present

## 2015-12-28 DIAGNOSIS — Z79899 Other long term (current) drug therapy: Secondary | ICD-10-CM | POA: Diagnosis not present

## 2015-12-28 DIAGNOSIS — M810 Age-related osteoporosis without current pathological fracture: Secondary | ICD-10-CM | POA: Diagnosis not present

## 2015-12-28 DIAGNOSIS — Z872 Personal history of diseases of the skin and subcutaneous tissue: Secondary | ICD-10-CM | POA: Insufficient documentation

## 2015-12-28 DIAGNOSIS — R079 Chest pain, unspecified: Secondary | ICD-10-CM | POA: Diagnosis present

## 2015-12-28 DIAGNOSIS — Z87448 Personal history of other diseases of urinary system: Secondary | ICD-10-CM | POA: Insufficient documentation

## 2015-12-28 DIAGNOSIS — Z862 Personal history of diseases of the blood and blood-forming organs and certain disorders involving the immune mechanism: Secondary | ICD-10-CM | POA: Insufficient documentation

## 2015-12-28 DIAGNOSIS — F17210 Nicotine dependence, cigarettes, uncomplicated: Secondary | ICD-10-CM | POA: Insufficient documentation

## 2015-12-28 DIAGNOSIS — Z940 Kidney transplant status: Secondary | ICD-10-CM | POA: Insufficient documentation

## 2015-12-28 LAB — BASIC METABOLIC PANEL
Anion gap: 15 (ref 5–15)
BUN: 65 mg/dL — AB (ref 6–20)
CHLORIDE: 100 mmol/L — AB (ref 101–111)
CO2: 21 mmol/L — AB (ref 22–32)
Calcium: 9.6 mg/dL (ref 8.9–10.3)
Creatinine, Ser: 8.39 mg/dL — ABNORMAL HIGH (ref 0.44–1.00)
GFR calc Af Amer: 7 mL/min — ABNORMAL LOW (ref >60)
GFR calc non Af Amer: 6 mL/min — ABNORMAL LOW (ref >60)
GLUCOSE: 84 mg/dL (ref 65–99)
POTASSIUM: 6.3 mmol/L — AB (ref 3.5–5.1)
Sodium: 136 mmol/L (ref 135–145)

## 2015-12-28 LAB — CBC
HEMATOCRIT: 31.9 % — AB (ref 36.0–46.0)
Hemoglobin: 10.2 g/dL — ABNORMAL LOW (ref 12.0–15.0)
MCH: 29.3 pg (ref 26.0–34.0)
MCHC: 32 g/dL (ref 30.0–36.0)
MCV: 91.7 fL (ref 78.0–100.0)
Platelets: 156 10*3/uL (ref 150–400)
RBC: 3.48 MIL/uL — ABNORMAL LOW (ref 3.87–5.11)
RDW: 15.3 % (ref 11.5–15.5)
WBC: 8.1 10*3/uL (ref 4.0–10.5)

## 2015-12-28 LAB — HCG, QUANTITATIVE, PREGNANCY: hCG, Beta Chain, Quant, S: 34 m[IU]/mL — ABNORMAL HIGH (ref <5)

## 2015-12-28 LAB — HEPATITIS B SURFACE ANTIGEN: Hepatitis B Surface Ag: NEGATIVE

## 2015-12-28 LAB — TROPONIN I: Troponin I: 0.08 ng/mL — ABNORMAL HIGH (ref <0.031)

## 2015-12-28 MED ORDER — OXYCODONE-ACETAMINOPHEN 5-325 MG PO TABS
1.0000 | ORAL_TABLET | Freq: Once | ORAL | Status: AC
  Administered 2015-12-28: 1 via ORAL
  Filled 2015-12-28: qty 1

## 2015-12-28 MED ORDER — SODIUM POLYSTYRENE SULFONATE 15 GM/60ML PO SUSP
60.0000 g | Freq: Once | ORAL | Status: AC
  Administered 2015-12-28: 60 g via ORAL
  Filled 2015-12-28: qty 240

## 2015-12-28 NOTE — ED Notes (Signed)
C/o CP since yesterday-seen at [facility] ED yesterday for same-NAD-steady gait

## 2015-12-28 NOTE — ED Notes (Signed)
Pt resting quietly. Requesting something for pain.

## 2015-12-28 NOTE — ED Notes (Signed)
arterial stick for labs per dr Clarene Duke.  R radial x1, + allens test

## 2015-12-28 NOTE — ED Notes (Signed)
Pt sts she must be discharged at this time. Sts he ride is here and cannot come back later.  Will notify MD.

## 2015-12-28 NOTE — ED Provider Notes (Signed)
CSN: [id number]     Arrival date & time 12/28/15  1448 History  By signing my name below, I, [name] Small, attest that this documentation has been prepared under the direction and in the presence of Laurence Spates, MD . Electronically Signed: [name], Scribe. 12/28/2015. 6:24 PM.    Chief Complaint  Patient presents with  . Chest Pain   The history is provided by the patient. No language interpreter was used.    HPI Comments:  [name] Small is a 25 y.o. female with a history of ESRD, PE,  cardiomyopathy and hyperkalemia, who presents to the Emergency Department complaining of constant non-radiating central CP since yesterday. She describes her pain as sharp. Pt has taken percocet with Kjell Brannen relief. She was evaluated at [facility] yesterday for her pain and was found to have an elevated Troponin and potassium. She was sent to have dialysis but states she didn't have entire session.  She denies cough, fever, and SOB.    Past Medical History  Diagnosis Date  . Hemodialysis patient (HCC)   . Hypertension   . Pulmonary emboli (HCC) 01/2012    Bilateral, moderate clot burden, areas of pulmonary infarction and central necrosis  . CHF (congestive heart failure) (HCC)   . Cardiomyopathy   . Dysrhythmia     at times per pt.  . Anemia   . H/O transfusion of packed red blood cells   . 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 Polycystic kidney disease   . Cellulitis and abscess of face 03/22/2013  . Renal insufficiency   . Chronic pain   . GERD (gastroesophageal reflux disease)   . Depression   . Hyperkalemia 09/2015  . Pelvic fracture (HCC)   . Osteoporosis   . Sickle cell anemia [facility])    Past Surgical History  Procedure Laterality Date  . Nephrectomy    . Av fistula placement    . Kidney transplant  2008    failed  . Tonsillectomy      as a child.  . Adenoidectomy     . Incision and drainage abscess Right 03/21/2013    Procedure: INCISION AND DRAINAGE RIGHT CHEEK ABSCESS REMOVAL OF FOREIGN BODY;  Surgeon: Serena Colonel, MD;  Location: [facility] OR;  Service: ENT;  Laterality: Right;  . Implantable cardioverter defibrillator implant Right 12/2014   Family History  Problem Relation Age of Onset  . Polycystic kidney disease Father    Social History  Substance Use Topics  . Smoking status: Current Some Day Smoker -- 0.50 packs/day for 1 years    Types: Cigarettes  . Smokeless tobacco: Never Used  . Alcohol Use: No   OB History    No data available     Review of Systems  Constitutional: Negative for fever and chills.  Respiratory: Negative for cough and shortness of breath.   Cardiovascular: Positive for chest pain.  Neurological: Positive for syncope.  All other systems reviewed and are negative.  Allergies  Tramadol; Vicodin; Buprenorphine hcl; Iohexol; and Morphine and related  Home Medications   Prior to Admission medications   Medication Sig Start Date End Date Taking? Authorizing Provider  acetaminophen (TYLENOL) 500 MG tablet Take 2 tablets (1,000 mg total) by mouth 4 (four) times daily as needed. Patient not taking: Reported on 12/26/2015 11/16/15   Rolm Gala Barrett, PA-C  amLODipine (NORVASC) 10 MG tablet Take 10 mg by mouth  daily. 09/26/15   Historical Provider, MD  amLODipine (NORVASC) 2.5 MG tablet Take 1 tablet (2.5 mg total) by mouth at bedtime. Patient not taking: Reported on 12/26/2015 10/12/15   Ripudeep Jenna Luo, MD  carvedilol (COREG) 6.25 MG tablet Take 1 tablet (6.25 mg total) by mouth 2 (two) times daily. 09/25/15   Zannie Cove, MD  cinacalcet (SENSIPAR) 30 MG tablet Take 1 tablet (30 mg total) by mouth daily with supper. Patient taking differently: Take 30 mg by mouth daily with breakfast.  09/25/15   Zannie Cove, MD  isosorbide dinitrate (ISORDIL) 10 MG tablet Take 1 tablet (10 mg total) by mouth 3 (three) times daily. 09/25/15   Zannie Cove, MD  LORazepam (ATIVAN) 1 MG tablet Take 1 tablet (1 mg total) by mouth daily as needed for anxiety. Patient states she takes one tablet by mouth every day per patient 10/12/15   Ripudeep Jenna Luo, MD  multivitamin (RENA-VIT) TABS tablet Take 1 tablet by mouth daily.    Historical Provider, MD  naproxen (NAPROSYN) 500 MG tablet Take 1 tablet (500 mg total) by mouth 2 (two) times daily. 11/07/15   Rolland Porter, MD  oxyCODONE (ROXICODONE) 5 MG immediate release tablet Take 1 tablet (5 mg total) by mouth every 4 (four) hours as needed for severe pain. 11/13/15   Tatyana Kirichenko, PA-C  sertraline (ZOLOFT) 100 MG tablet Take 100 mg by mouth daily.    Historical Provider, MD  sevelamer carbonate (RENVELA) 800 MG tablet Take 3 tablets (2,400 mg total) by mouth 3 (three) times daily with meals. 09/25/15 09/24/16  Zannie Cove, MD   BP 141/108 mmHg  Pulse 97  Temp(Src) 98.9 F (37.2 C) (Oral)  Resp 16  Ht  (1.6 m)  Wt 112 lb 7 oz (51 kg)  BMI 19.92 kg/m2  SpO2 100% Physical Exam  Constitutional: She is oriented to person, place, and time. She appears well-developed and well-nourished. No distress.  Tearful and uncomfortable  HENT:  Head: Normocephalic and atraumatic.  Moist mucous membranes  Eyes: Conjunctivae are normal. Pupils are equal, round, and reactive to light.  Neck: Neck supple.  Cardiovascular: Normal rate, regular rhythm and normal heart sounds.   No murmur heard. Pulmonary/Chest: Effort normal and breath sounds normal.  Abdominal: Soft. Bowel sounds are normal. She exhibits no distension. There is no tenderness.  Musculoskeletal: She exhibits no edema.  Neurological: She is alert and oriented to person, place, and time.  Fluent speech  Skin: Skin is warm and dry.  Psychiatric: Judgment normal.  Nursing note and vitals reviewed.   ED Course  Procedures   DIAGNOSTIC STUDIES:  Oxygen Saturation is 100% on RA, normal by my interpretation.    COORDINATION OF  CARE:  6:19 PM Discussed treatment plan with pt at bedside and pt agreed to plan.  Labs Review Labs Reviewed  BASIC METABOLIC PANEL - Abnormal; Notable for the following:    Potassium 6.3 (*)    Chloride 100 (*)    CO2 21 (*)    BUN 65 (*)    Creatinine, Ser 8.39 (*)    GFR calc non Af Amer 6 (*)    GFR calc Af Amer 7 (*)    All other components within normal limits  CBC - Abnormal; Notable for the following:    RBC 3.48 (*)    Hemoglobin 10.2 (*)    HCT 31.9 (*)    All other components within normal limits  TROPONIN I - Abnormal; Notable for  the following:    Troponin I 0.08 (*)    All other components within normal limits  HCG, QUANTITATIVE, PREGNANCY - Abnormal; Notable for the following:    hCG, Beta Chain, Quant, S 34 (*)    All other components within normal limits    Imaging Review Dg Pelvis 1-2 Views  12/28/2015  CLINICAL DATA:  History of pelvic fracture. Pelvic pain. Hemodialysis. EXAM: PELVIS - 1-2 VIEW COMPARISON:  CT abdomen pelvis 10/26/2015 FINDINGS: Erosive changes of the SI joint bilaterally. No fusion of the SI joint. Fragmentation of the right pubis. This may be due to surgery with bone grafting versus chronic fracture with heterotopic bone formation. Negative for acute fracture IMPRESSION: Chronic resorptive changes of the SI joints bilaterally. Chronic changes the right pubic bone stable from the prior CT. 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 Negative for acute fracture Electronically Signed   By: Marlan Palau M.D.   On: 12/28/2015 20:56   I have personally reviewed and evaluated these lab results as part of my medical decision-making.   EKG Interpretation   Date/Time:  Tuesday December 28 2015 14:57:07 EST Ventricular Rate:  92 PR Interval:  250 QRS Duration: 92 QT Interval:  362 QTC Calculation: 447 R Axis:   60 Text Interpretation:  Sinus rhythm with 1st degree A-V block ST \\T \ T wave  abnormality, consider inferolateral ischemia Abnormal ECG  abnormal T waves  in lateral leads similar to previous EKG Confirmed by Jordan Caraveo MD, Janashia Parco  [phone]) on 12/28/2015 5:49:44 PM     Medications  oxyCODONE-acetaminophen (PERCOCET/ROXICET) 5-325 MG per tablet 1 tablet (1 tablet Oral Given 12/28/15 1958)  sodium polystyrene (KAYEXALATE) 15 GM/60ML suspension 60 g (60 g Oral Given 12/28/15 2119)    MDM   Final diagnoses:  Chest pain, unspecified chest pain type  Hyperkalemia   Patient with extensive medical history including multiple presentations for chest pain who presents with ongoing chest pain since yesterday. She was treated for hyperkalemia with dialysis on 3/5. On exam, patient was uncomfortable but in no acute distress. Vital signs stable. Work of breathing and no abnormal lung sounds. EKG is unchanged from previous. Gave the patient Percocet. Obtained a pelvis x-ray as patient complained of pelvic pain and history of pelvic fracture. X-ray showed no acute findings.   I reviewed chest x-ray from 2 days ago which was unremarkable and given no respiratory symptoms and normal O2 sat on room air, do not feel she needs a repeat chest x-ray. I considered PE, however patient has had multiple presentations for chest pain and at least 6 chest CTs previously for work up. Given no hypoxia or shortness of breath, I feel PE is less likely. Labs show troponin 0.08 which is similar to 2 days ago and beta hCG which is 34, similar to previous values. Her potassium is persistently elevated at 6.3, however no EKG changes of hyperkalemia. I discussed with nephrologist on call, Dr. Allena Katz, and I appreciate his assistance. Per his recommendation, I gave the patient 60 g Kayexalate and instructed her to follow-up with her [place] dialysis clinic. Instructed to report to [facility] ED in the morning if she develops any shortness of breath or worsening symptoms. Patient voiced understanding and was discharged in satisfactory condition.   I personally performed the  services described in this documentation, which was scribed in my presence. The recorded information has been reviewed and is accurate.   Laurence Spates, MD 12/29/15 [phone]

## 2016-01-02 ENCOUNTER — Emergency Department (HOSPITAL_BASED_OUTPATIENT_CLINIC_OR_DEPARTMENT_OTHER): Payer: Medicare Other

## 2016-01-02 ENCOUNTER — Encounter (HOSPITAL_BASED_OUTPATIENT_CLINIC_OR_DEPARTMENT_OTHER): Payer: Self-pay

## 2016-01-02 ENCOUNTER — Other Ambulatory Visit: Payer: Self-pay

## 2016-01-02 ENCOUNTER — Inpatient Hospital Stay (HOSPITAL_BASED_OUTPATIENT_CLINIC_OR_DEPARTMENT_OTHER)
Admission: EM | Admit: 2016-01-02 | Discharge: 2016-01-03 | DRG: 640 | Discharge status: Against Medical Advice | Payer: Medicare Other | Attending: Internal Medicine | Admitting: Internal Medicine

## 2016-01-02 DIAGNOSIS — I429 Cardiomyopathy, unspecified: Secondary | ICD-10-CM | POA: Diagnosis present

## 2016-01-02 DIAGNOSIS — E875 Hyperkalemia: Secondary | ICD-10-CM | POA: Diagnosis present

## 2016-01-02 DIAGNOSIS — K219 Gastro-esophageal reflux disease without esophagitis: Secondary | ICD-10-CM | POA: Diagnosis present

## 2016-01-02 DIAGNOSIS — R0781 Pleurodynia: Secondary | ICD-10-CM | POA: Diagnosis present

## 2016-01-02 DIAGNOSIS — D631 Anemia in chronic kidney disease: Secondary | ICD-10-CM | POA: Diagnosis present

## 2016-01-02 DIAGNOSIS — M810 Age-related osteoporosis without current pathological fracture: Secondary | ICD-10-CM | POA: Diagnosis present

## 2016-01-02 DIAGNOSIS — F17210 Nicotine dependence, cigarettes, uncomplicated: Secondary | ICD-10-CM | POA: Diagnosis present

## 2016-01-02 DIAGNOSIS — I440 Atrioventricular block, first degree: Secondary | ICD-10-CM | POA: Diagnosis present

## 2016-01-02 DIAGNOSIS — N185 Chronic kidney disease, stage 5: Secondary | ICD-10-CM | POA: Diagnosis present

## 2016-01-02 DIAGNOSIS — Q613 Polycystic kidney, unspecified: Secondary | ICD-10-CM

## 2016-01-02 DIAGNOSIS — R000 Tachycardia, unspecified: Secondary | ICD-10-CM | POA: Diagnosis present

## 2016-01-02 DIAGNOSIS — N186 End stage renal disease: Secondary | ICD-10-CM

## 2016-01-02 DIAGNOSIS — Z91041 Radiographic dye allergy status: Secondary | ICD-10-CM

## 2016-01-02 DIAGNOSIS — I5022 Chronic systolic (congestive) heart failure: Secondary | ICD-10-CM | POA: Diagnosis present

## 2016-01-02 DIAGNOSIS — F329 Major depressive disorder, single episode, unspecified: Secondary | ICD-10-CM | POA: Diagnosis present

## 2016-01-02 DIAGNOSIS — Z992 Dependence on renal dialysis: Secondary | ICD-10-CM | POA: Diagnosis not present

## 2016-01-02 DIAGNOSIS — Z841 Family history of disorders of kidney and ureter: Secondary | ICD-10-CM

## 2016-01-02 DIAGNOSIS — Z888 Allergy status to other drugs, medicaments and biological substances status: Secondary | ICD-10-CM

## 2016-01-02 DIAGNOSIS — Z95810 Presence of automatic (implantable) cardiac defibrillator: Secondary | ICD-10-CM | POA: Diagnosis not present

## 2016-01-02 DIAGNOSIS — D571 Sickle-cell disease without crisis: Secondary | ICD-10-CM | POA: Diagnosis present

## 2016-01-02 DIAGNOSIS — R079 Chest pain, unspecified: Secondary | ICD-10-CM | POA: Diagnosis present

## 2016-01-02 DIAGNOSIS — Z95828 Presence of other vascular implants and grafts: Secondary | ICD-10-CM

## 2016-01-02 DIAGNOSIS — Z9119 Patient's noncompliance with other medical treatment and regimen: Secondary | ICD-10-CM

## 2016-01-02 DIAGNOSIS — I5023 Acute on chronic systolic (congestive) heart failure: Secondary | ICD-10-CM | POA: Diagnosis present

## 2016-01-02 DIAGNOSIS — I132 Hypertensive heart and chronic kidney disease with heart failure and with stage 5 chronic kidney disease, or end stage renal disease: Secondary | ICD-10-CM | POA: Diagnosis present

## 2016-01-02 DIAGNOSIS — Z86711 Personal history of pulmonary embolism: Secondary | ICD-10-CM

## 2016-01-02 DIAGNOSIS — Z885 Allergy status to narcotic agent status: Secondary | ICD-10-CM

## 2016-01-02 DIAGNOSIS — Z9885 Transplanted organ removal status: Secondary | ICD-10-CM

## 2016-01-02 LAB — BASIC METABOLIC PANEL
ANION GAP: 17 — AB (ref 5–15)
Anion gap: 24 — ABNORMAL HIGH (ref 5–15)
BUN: 87 mg/dL — ABNORMAL HIGH (ref 6–20)
BUN: 84 mg/dL — ABNORMAL HIGH (ref 6–20)
CHLORIDE: 97 mmol/L — AB (ref 101–111)
CHLORIDE: 97 mmol/L — AB (ref 101–111)
CO2: 21 mmol/L — ABNORMAL LOW (ref 22–32)
CO2: 20 mmol/L — ABNORMAL LOW (ref 22–32)
CREATININE: 11.51 mg/dL — AB (ref 0.44–1.00)
Calcium: 10 mg/dL (ref 8.9–10.3)
Calcium: 10.5 mg/dL — ABNORMAL HIGH (ref 8.9–10.3)
Creatinine, Ser: 11.32 mg/dL — ABNORMAL HIGH (ref 0.44–1.00)
GFR, EST AFRICAN AMERICAN: 5 mL/min — AB (ref >60)
GFR, EST AFRICAN AMERICAN: 5 mL/min — AB (ref >60)
GFR, EST NON AFRICAN AMERICAN: 4 mL/min — AB (ref >60)
GFR, EST NON AFRICAN AMERICAN: 4 mL/min — AB (ref >60)
Glucose, Bld: 91 mg/dL (ref 65–99)
Glucose, Bld: 80 mg/dL (ref 65–99)
POTASSIUM: 8.2 mmol/L — AB (ref 3.5–5.1)
Potassium: 4.5 mmol/L (ref 3.5–5.1)
SODIUM: 135 mmol/L (ref 135–145)
SODIUM: 141 mmol/L (ref 135–145)

## 2016-01-02 LAB — CBC WITH DIFFERENTIAL/PLATELET
BASOS PCT: 1 %
Basophils Absolute: 0 10*3/uL (ref 0.0–0.1)
EOS ABS: 0 10*3/uL (ref 0.0–0.7)
EOS PCT: 1 %
HCT: 29.8 % — ABNORMAL LOW (ref 36.0–46.0)
HEMOGLOBIN: 9.5 g/dL — AB (ref 12.0–15.0)
Lymphocytes Relative: 24 %
Lymphs Abs: 1.6 10*3/uL (ref 0.7–4.0)
MCH: 29 pg (ref 26.0–34.0)
MCHC: 31.9 g/dL (ref 30.0–36.0)
MCV: 90.9 fL (ref 78.0–100.0)
Monocytes Absolute: 0.7 10*3/uL (ref 0.1–1.0)
Monocytes Relative: 11 %
NEUTROS PCT: 63 %
Neutro Abs: 4.1 10*3/uL (ref 1.7–7.7)
PLATELETS: 195 10*3/uL (ref 150–400)
RBC: 3.28 MIL/uL — AB (ref 3.87–5.11)
RDW: 15.3 % (ref 11.5–15.5)
WBC: 6.5 10*3/uL (ref 4.0–10.5)

## 2016-01-02 LAB — GLUCOSE, CAPILLARY
GLUCOSE-CAPILLARY: 119 mg/dL — AB (ref 65–99)
GLUCOSE-CAPILLARY: 106 mg/dL — AB (ref 65–99)
Glucose-Capillary: 102 mg/dL — ABNORMAL HIGH (ref 65–99)
Glucose-Capillary: 62 mg/dL — ABNORMAL LOW (ref 65–99)

## 2016-01-02 LAB — CBC
HEMATOCRIT: 27.5 % — AB (ref 36.0–46.0)
Hemoglobin: 8.4 g/dL — ABNORMAL LOW (ref 12.0–15.0)
MCH: 27.4 pg (ref 26.0–34.0)
MCHC: 30.5 g/dL (ref 30.0–36.0)
MCV: 89.6 fL (ref 78.0–100.0)
Platelets: 175 10*3/uL (ref 150–400)
RBC: 3.07 MIL/uL — ABNORMAL LOW (ref 3.87–5.11)
RDW: 15.7 % — AB (ref 11.5–15.5)
WBC: 6.7 10*3/uL (ref 4.0–10.5)

## 2016-01-02 LAB — D-DIMER, QUANTITATIVE: D-Dimer, Quant: 2.27 ug/mL-FEU — ABNORMAL HIGH (ref 0.00–0.50)

## 2016-01-02 LAB — CBG MONITORING, ED
GLUCOSE-CAPILLARY: 81 mg/dL (ref 65–99)
GLUCOSE-CAPILLARY: 45 mg/dL — AB (ref 65–99)
Glucose-Capillary: 105 mg/dL — ABNORMAL HIGH (ref 65–99)

## 2016-01-02 LAB — TROPONIN I
TROPONIN I: 0.1 ng/mL — AB (ref <0.031)
Troponin I: 0.2 ng/mL — ABNORMAL HIGH (ref <0.031)

## 2016-01-02 LAB — HCG, SERUM, QUALITATIVE: Preg, Serum: NEGATIVE

## 2016-01-02 MED ORDER — HEPARIN SODIUM (PORCINE) 5000 UNIT/ML IJ SOLN: 5000.0000 [IU] | Freq: Three times a day (TID) | INTRAMUSCULAR | Status: DC

## 2016-01-02 MED ORDER — ACETAMINOPHEN 325 MG PO TABS: 650.0000 mg | ORAL_TABLET | Freq: Four times a day (QID) | ORAL | Status: DC | PRN

## 2016-01-02 MED ORDER — AMLODIPINE BESYLATE 10 MG PO TABS
10.0000 mg | ORAL_TABLET | Freq: Every day | ORAL | Status: DC
  Administered 2016-01-03: 10 mg via ORAL
  Filled 2016-01-02: qty 1

## 2016-01-02 MED ORDER — SODIUM POLYSTYRENE SULFONATE 15 GM/60ML PO SUSP
15.0000 g | Freq: Once | ORAL | Status: AC
  Administered 2016-01-02: 15 g via ORAL
  Filled 2016-01-02: qty 60

## 2016-01-02 MED ORDER — HYDROMORPHONE HCL 1 MG/ML IJ SOLN
0.5000 mg | INTRAMUSCULAR | Status: DC | PRN
  Administered 2016-01-03 (×5): 0.5 mg via INTRAVENOUS
  Filled 2016-01-02 (×3): qty 1

## 2016-01-02 MED ORDER — LORAZEPAM 1 MG PO TABS: 1.0000 mg | ORAL_TABLET | Freq: Every day | ORAL | Status: DC | PRN

## 2016-01-02 MED ORDER — DEXTROSE 50 % IV SOLN
1.0000 | Freq: Once | INTRAVENOUS | Status: AC
  Administered 2016-01-02: 50 mL via INTRAVENOUS
  Filled 2016-01-02: qty 50

## 2016-01-02 MED ORDER — RENA-VITE PO TABS
1.0000 | ORAL_TABLET | Freq: Every day | ORAL | Status: DC
  Administered 2016-01-03: 1 via ORAL
  Filled 2016-01-02 (×2): qty 1

## 2016-01-02 MED ORDER — ASPIRIN EC 81 MG PO TBEC
81.0000 mg | DELAYED_RELEASE_TABLET | Freq: Every day | ORAL | Status: DC
  Administered 2016-01-03: 81 mg via ORAL
  Filled 2016-01-02: qty 1

## 2016-01-02 MED ORDER — ISOSORBIDE DINITRATE 10 MG PO TABS
10.0000 mg | ORAL_TABLET | Freq: Three times a day (TID) | ORAL | Status: DC
  Administered 2016-01-02 – 2016-01-03 (×2): 10 mg via ORAL
  Filled 2016-01-02 (×3): qty 1

## 2016-01-02 MED ORDER — OXYCODONE-ACETAMINOPHEN 5-325 MG PO TABS
1.0000 | ORAL_TABLET | Freq: Once | ORAL | Status: AC
  Administered 2016-01-02: 1 via ORAL
  Filled 2016-01-02: qty 1

## 2016-01-02 MED ORDER — SODIUM CHLORIDE 0.9 % IV SOLN: 1.0000 g | Freq: Once | INTRAVENOUS | Status: DC

## 2016-01-02 MED ORDER — CINACALCET HCL 30 MG PO TABS
30.0000 mg | ORAL_TABLET | Freq: Every day | ORAL | Status: DC
  Administered 2016-01-03: 30 mg via ORAL
  Filled 2016-01-02: qty 1

## 2016-01-02 MED ORDER — DEXTROSE 50 % IV SOLN
50.0000 mL | Freq: Once | INTRAVENOUS | Status: AC
  Administered 2016-01-02: 50 mL via INTRAVENOUS
  Filled 2016-01-02: qty 50

## 2016-01-02 MED ORDER — DEXTROSE 50 % IV SOLN
1.0000 | Freq: Once | INTRAVENOUS | Status: AC
  Administered 2016-01-02: 50 mL via INTRAVENOUS

## 2016-01-02 MED ORDER — SERTRALINE HCL 100 MG PO TABS: 100.0000 mg | ORAL_TABLET | Freq: Every day | ORAL | Status: DC

## 2016-01-02 MED ORDER — AMLODIPINE BESYLATE 2.5 MG PO TABS: 2.5000 mg | ORAL_TABLET | Freq: Every day | ORAL | Status: DC

## 2016-01-02 MED ORDER — INSULIN ASPART 100 UNIT/ML IV SOLN
5.0000 [IU] | Freq: Once | INTRAVENOUS | Status: AC
  Administered 2016-01-02: 5 [IU] via INTRAVENOUS
  Filled 2016-01-02: qty 1

## 2016-01-02 MED ORDER — DIPHENHYDRAMINE HCL 50 MG/ML IJ SOLN
25.0000 mg | Freq: Once | INTRAMUSCULAR | Status: AC
  Administered 2016-01-02: 25 mg via INTRAVENOUS
  Filled 2016-01-02: qty 1

## 2016-01-02 MED ORDER — CINACALCET HCL 30 MG PO TABS: 30.0000 mg | ORAL_TABLET | Freq: Every day | ORAL | Status: DC

## 2016-01-02 MED ORDER — ALBUTEROL (5 MG/ML) CONTINUOUS INHALATION SOLN
20.0000 mg/h | INHALATION_SOLUTION | Freq: Once | RESPIRATORY_TRACT | Status: AC
  Administered 2016-01-02: 20 mg/h via RESPIRATORY_TRACT
  Filled 2016-01-02: qty 20

## 2016-01-02 MED ORDER — INSULIN REGULAR HUMAN 100 UNIT/ML IJ SOLN
5.0000 [IU] | Freq: Once | INTRAMUSCULAR | Status: AC
  Administered 2016-01-02: 5 [IU] via INTRAVENOUS

## 2016-01-02 MED ORDER — DEXTROSE 50 % IV SOLN
INTRAVENOUS | Status: AC
  Filled 2016-01-02: qty 50

## 2016-01-02 MED ORDER — HYDROMORPHONE HCL 1 MG/ML IJ SOLN
1.0000 mg | Freq: Once | INTRAMUSCULAR | Status: AC
  Administered 2016-01-02: 1 mg via INTRAVENOUS
  Filled 2016-01-02: qty 1

## 2016-01-02 MED ORDER — SODIUM CHLORIDE 0.9% FLUSH
3.0000 mL | Freq: Two times a day (BID) | INTRAVENOUS | Status: DC
  Administered 2016-01-02 – 2016-01-03 (×2): 3 mL via INTRAVENOUS

## 2016-01-02 MED ORDER — SEVELAMER CARBONATE 800 MG PO TABS
2400.0000 mg | ORAL_TABLET | Freq: Three times a day (TID) | ORAL | Status: DC
  Administered 2016-01-03: 2400 mg via ORAL
  Filled 2016-01-02 (×2): qty 3

## 2016-01-02 MED ORDER — SODIUM CHLORIDE 0.9 % IV SOLN
INTRAVENOUS | Status: AC
  Filled 2016-01-02: qty 2.5

## 2016-01-02 MED ORDER — CARVEDILOL 6.25 MG PO TABS
6.2500 mg | ORAL_TABLET | Freq: Two times a day (BID) | ORAL | Status: DC
  Administered 2016-01-02 – 2016-01-03 (×2): 6.25 mg via ORAL
  Filled 2016-01-02 (×2): qty 1

## 2016-01-02 MED ORDER — HYDROMORPHONE HCL 1 MG/ML IJ SOLN: 0.5000 mg | Freq: Once | INTRAMUSCULAR | Status: AC

## 2016-01-02 MED ORDER — SERTRALINE HCL 100 MG PO TABS
100.0000 mg | ORAL_TABLET | Freq: Every day | ORAL | Status: DC
  Administered 2016-01-02: 100 mg via ORAL
  Filled 2016-01-02: qty 1

## 2016-01-02 MED ORDER — SODIUM CHLORIDE 0.9 % IV SOLN
1.0000 g | Freq: Once | INTRAVENOUS | Status: AC
  Administered 2016-01-02: 1 g via INTRAVENOUS
  Filled 2016-01-02: qty 10

## 2016-01-02 MED ORDER — SEVELAMER CARBONATE 800 MG PO TABS: 2400.0000 mg | ORAL_TABLET | Freq: Three times a day (TID) | ORAL | Status: DC

## 2016-01-02 MED ORDER — ONDANSETRON HCL 4 MG PO TABS: 4.0000 mg | ORAL_TABLET | Freq: Four times a day (QID) | ORAL | Status: DC | PRN

## 2016-01-02 MED ORDER — ONDANSETRON HCL 4 MG/2ML IJ SOLN: 4.0000 mg | Freq: Four times a day (QID) | INTRAMUSCULAR | Status: DC | PRN

## 2016-01-02 MED ORDER — MORPHINE SULFATE (PF) 4 MG/ML IV SOLN
4.0000 mg | Freq: Once | INTRAVENOUS | Status: AC
  Administered 2016-01-02: 4 mg via INTRAVENOUS
  Filled 2016-01-02: qty 1

## 2016-01-02 NOTE — Progress Notes (Signed)
Patient arrived to room 3E19 right before change of shift via Ambulance from [facility]. Currently patient complains of chest pain but has no pain medication ordered. Patient states when given morphine develops rash on skin and itching. Notified Dr. Toniann Fail. Orders given for one time dose of Dilaudid and stat orders for troponin and BMP. Will continue to monitor patient to end of shift.

## 2016-01-02 NOTE — ED Provider Notes (Signed)
CSN: [id number]     Arrival date & time 01/02/16  1244 History   First MD Initiated Contact with Patient 01/02/16 1349     Chief Complaint  Patient presents with  . Chest Pain  . Shortness of Breath     (Consider location/radiation/quality/duration/timing/severity/associated sxs/prior Treatment) HPI   Patient is a 25 year old female with past medical history of hypertension, CHF, PE, ESRD (on hemodialysis M/W/F), cardiomyopathy and hyperkalemia who presents to the ED with complaint of gradual onset chest pain. Patient reports having constant sharp and tight pain to her midsternal and left chest, denies radiation. She reports the pain is worse when laying supine, denies any alleviating factors. Patient reports taking Percocet at home with no relief. Patient reports her chest pain is similar in characteristic to chest pain she has had in the past. Patient reports she completed her dialysis treatment 2 days ago in [place] without any complications, she notes her potassium was 5. Endorses associated lightheadedness when going from sitting to standing, shortness of breath and fatigue. Denies fever, chills, headache, visual changes, nasal congestion, sore throat, wheezing, cough, palpitations, abdominal pain, N/V/D, numbness, tingling, weakness, leg swelling.   PCP- Elsie Am[facility])  Past Medical History  Diagnosis Date  . Hemodialysis patient (HCC)   . Hypertension   . Pulmonary emboli (HCC) 01/2012    Bilateral, moderate clot burden, areas of pulmonary infarction and central necrosis  . CHF (congestive heart failure) (HCC)   . Cardiomyopathy   . Dysrhythmia     at times per pt.  . Anemia   . H/O transfusion of packed red blood cells   . 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 Polycystic kidney disease   . Cellulitis and abscess of face 03/22/2013  . Renal insufficiency   .  Chronic pain   . GERD (gastroesophageal reflux disease)   . Depression   . Hyperkalemia 09/2015  . Pelvic fracture (HCC)   . Osteoporosis   . Sickle cell anemia [facility])    Past Surgical History  Procedure Laterality Date  . Nephrectomy    . Av fistula placement    . Kidney transplant  2008    failed  . Tonsillectomy      as a child.  . Adenoidectomy    . Incision and drainage abscess Right 03/21/2013    Procedure: INCISION AND DRAINAGE RIGHT CHEEK ABSCESS REMOVAL OF FOREIGN BODY;  Surgeon: Serena Colonel, MD;  Location: [facility] OR;  Service: ENT;  Laterality: Right;  . Implantable cardioverter defibrillator implant Right 12/2014   Family History  Problem Relation Age of Onset  . Polycystic kidney disease Father    Social History  Substance Use Topics  . Smoking status: Current Some Day Smoker -- 0.50 packs/day for 1 years    Types: Cigarettes  . Smokeless tobacco: Never Used  . Alcohol Use: No   OB History    No data available     Review of Systems  Constitutional: Positive for fatigue.  Respiratory: Positive for shortness of breath.   Cardiovascular: Positive for chest pain.  Neurological: Positive for light-headedness.  All other systems reviewed and are negative.     Allergies  Tramadol; Vicodin; Buprenorphine hcl; Iohexol; and Morphine and related  Home Medications   Prior to Admission medications   Medication Sig Start Date End Date Taking? Authorizing Provider  acetaminophen (TYLENOL) 500 MG tablet Take 2 tablets (1,000  mg total) by mouth 4 (four) times daily as needed. Patient not taking: Reported on 12/26/2015 11/16/15   Rolm Gala Barrett, PA-C  amLODipine (NORVASC) 10 MG tablet Take 10 mg by mouth daily. 09/26/15   Historical Provider, MD  amLODipine (NORVASC) 2.5 MG tablet Take 1 tablet (2.5 mg total) by mouth at bedtime. Patient not taking: Reported on 12/26/2015 10/12/15   Ripudeep Jenna Luo, MD  carvedilol (COREG) 6.25 MG tablet Take 1 tablet (6.25 mg total) by mouth 2  (two) times daily. 09/25/15   Zannie Cove, MD  cinacalcet (SENSIPAR) 30 MG tablet Take 1 tablet (30 mg total) by mouth daily with supper. Patient taking differently: Take 30 mg by mouth daily with breakfast.  09/25/15   Zannie Cove, MD  isosorbide dinitrate (ISORDIL) 10 MG tablet Take 1 tablet (10 mg total) by mouth 3 (three) times daily. 09/25/15   Zannie Cove, MD  LORazepam (ATIVAN) 1 MG tablet Take 1 tablet (1 mg total) by mouth daily as needed for anxiety. Patient states she takes one tablet by mouth every day per patient 10/12/15   Ripudeep Jenna Luo, MD  multivitamin (RENA-VIT) TABS tablet Take 1 tablet by mouth daily.    Historical Provider, MD  naproxen (NAPROSYN) 500 MG tablet Take 1 tablet (500 mg total) by mouth 2 (two) times daily. 11/07/15   Rolland Porter, MD  oxyCODONE (ROXICODONE) 5 MG immediate release tablet Take 1 tablet (5 mg total) by mouth every 4 (four) hours as needed for severe pain. 11/13/15   Tatyana Kirichenko, PA-C  sertraline (ZOLOFT) 100 MG tablet Take 100 mg by mouth daily.    Historical Provider, MD  sevelamer carbonate (RENVELA) 800 MG tablet Take 3 tablets (2,400 mg total) by mouth 3 (three) times daily with meals. 09/25/15 09/24/16  Zannie Cove, MD   BP 143/76 mmHg  Pulse 122  Temp(Src) 98.9 F (37.2 C) (Oral)  Resp 26  Ht 5\' 3"  (1.6 m)  Wt 51 kg  BMI 19.92 kg/m2  SpO2 100% Physical Exam  Constitutional: She is oriented to person, place, and time. She appears well-developed and well-nourished.  HENT:  Head: Normocephalic and atraumatic.  Mouth/Throat: Oropharynx is clear and moist. No oropharyngeal exudate.  Eyes: Conjunctivae and EOM are normal. Pupils are equal, round, and reactive to light. Right eye exhibits no discharge. Left eye exhibits no discharge. No scleral icterus.  Neck: Normal range of motion. Neck supple.  Cardiovascular: Normal rate, regular rhythm, normal heart sounds and intact distal pulses.   Pulmonary/Chest: Effort normal and breath  sounds normal. No respiratory distress. She has no wheezes. She has no rales. She exhibits tenderness (left anterior chest wall TTP).  Abdominal: Soft. Bowel sounds are normal. She exhibits no distension and no mass. There is no tenderness. There is no rebound and no guarding.  Musculoskeletal: Normal range of motion. She exhibits no edema.  Lymphadenopathy:    She has no cervical adenopathy.  Neurological: She is alert and oriented to person, place, and time.  Skin: Skin is warm and dry.  Nursing note and vitals reviewed.   ED Course  .Critical Care Performed by: Barrett Henle Authorized by: Barrett Henle Total critical care time: 30 minutes Critical care time was exclusive of separately billable procedures and treating other patients. Critical care was necessary to treat or prevent imminent or life-threatening deterioration of the following conditions: metabolic crisis. Critical care was time spent personally by me on the following activities: blood draw for specimens, development of treatment plan  with patient or surrogate, discussions with consultants, evaluation of patient's response to treatment, examination of patient, obtaining history from patient or surrogate, ordering and performing treatments and interventions, ordering and review of laboratory studies, ordering and review of radiographic studies, pulse oximetry, re-evaluation of patient's condition and review of old charts.   (including critical care time) Labs Review Labs Reviewed  CBC WITH DIFFERENTIAL/PLATELET - Abnormal; Notable for the following:    RBC 3.28 (*)    Hemoglobin 9.5 (*)    HCT 29.8 (*)    All other components within normal limits  BASIC METABOLIC PANEL - Abnormal; Notable for the following:    Potassium 8.2 (*)    Chloride 97 (*)    CO2 21 (*)    BUN 87 (*)    Creatinine, Ser 11.32 (*)    GFR calc non Af Amer 4 (*)    GFR calc Af Amer 5 (*)    Anion gap 17 (*)    All other  components within normal limits  TROPONIN I - Abnormal; Notable for the following:    Troponin I 0.10 (*)    All other components within normal limits  CBG MONITORING, ED - Abnormal; Notable for the following:    Glucose-Capillary 105 (*)    All other components within normal limits  CBG MONITORING, ED    Imaging Review Dg Chest 2 View  01/02/2016  CLINICAL DATA:  Chest pain, shortness of Breath EXAM: CHEST  2 VIEW COMPARISON:  12/26/2015 FINDINGS: Right AICD remains in place, unchanged. Cardiomegaly. No confluent opacities or effusions. No edema. No acute bony abnormality. IMPRESSION: Cardiomegaly.  No active disease. Electronically Signed   By: Charlett Nose M.D.   On: 01/02/2016 14:29   I have personally reviewed and evaluated these images and lab results as part of my medical decision-making.   EKG Interpretation None      MDM   Final diagnoses:  Hyperkalemia   Patient presents for chest pain and shortness of breath. History of ESRD, hypertension, CHF, pacemaker defibrillator, PE and cardiomyopathy. Patient has been seen in emergency department several times for similar chest pains. VSS. Exam revealed mild tenderness over left anterior chest wall, remaining exam unremarkable. EKG showed sinus rhythm with first-degree AV block. Trop 0.1. Potassium 8.2, no EKG changes consistent with hyperkalemia. BUN 87, Cr 11.32. CXR negative. Pt given albuterol, calcium, insulin and dextrose in the ED. Consulted nephrology. Dr. Arlean Hopping advised to have pt admitted to medicine for tx with hemodialysis. Consulted hospitalist. Dr. Jerral Ralph agrees to admission, requested to order kayexalate while in the ED. Orders placed for telemetry bed. Discussed results and plan for admission with pt.    Satira Sark [place], [place] 01/02/16 1740  Alvira Monday, MD 01/03/16 [phone]

## 2016-01-02 NOTE — Progress Notes (Signed)
Hypoglycemic Event  CBG: 62  Treatment: 15 GM carbohydrate snack  Symptoms: Shaky  Follow-up CBG: Time:10:08 PM  CBG Result: 106  Possible Reasons for Event: Medication Regimen: Patient had elevated potassium of 8 and when in the ED was given the insulin along with the Calcium gluconate to help lower potassium and ever since has caused blood sugar to be up and down but more on the low end   Comments/MD notified:Dr. Toniann Fail was notified    [name] Small  Adult (Non-Pregnant) Hypoglycemia Standing Orders  1.  RN shall initiate Hypoglycemia Standing Orders for emergency measures immediately when:            w Routine or STAT CBG and/or a lab glucose indicates hypoglycemia (CBG < 70 mg/dl)  2.  Treat the patient according to ability to take PO's and severity of hypoglycemia.   3.  If patient is on GlucoStabilizer, follow directions provided by the [facility] for hypoglycemic events.  4.  If patient on insulin pump, follow Hypoglycemia Standing Orders.  If patient requires more than one treatment have patient place pump in SUSPEND and notify MD.  DO NOT leave pump in SUSPEND for greater than 30 minutes unless ordered by MD.  A.  Treatment for Mild or Moderate-Patient cooperative and able to swallow    1.  Patient taking PO's and can cooperate   a.  Give one of the following 15 gram CHO options:                           w 1 tube oral dextrose gel                           w 3-4 Glucose tablets                           w 4 oz. Juice                           w 4 oz. regular soda                                    ESRD patients:  clear, regular soda                           w 8 oz. skim milk    b.  Recheck CBG in 15 minutes after treatment                            w If CBG < 70 mg/dl, repeat treatment and recheck until hypoglycemia is resolved                            w If CBG > 70 mg/dl and next meal is more than 1 hour away, give additional 15 grams CHO   2.   Patient NPO-Patient cooperative and no altered mental status    a.  Give 25 ml of D50 IV.   b.  Recheck CBG in 15 minutes after treatment.  w If CBG is less than 70 mg/dl, repeat treatment and recheck until hypoglycemia is resolved.   c.  Notify MD for further orders.             SPECIAL CONSIDERATIONS:    a.  If no IV access,                              w Start IV of D5W at [facility]                             w Give 25 ml of D50 IV.    b.  If unable to gain IV access                             w Give Glucagon IM:    i.  1 mg if patient weighs more than 45.5 kg    ii.  0.5 mg if patient weighs less than 45.5 kg   c.  Notify MD for further orders  B.  Treatment for Severe-- Patient unconscious or unable to take PO's safely    1.  Position patient on side   2.  Give 50 ml D50 IV   3.  Recheck CBG in 15 minutes.                    w If CBG is less than 70 mg/dl, repeat treatment and recheck until hypoglycemia is resolved.   4.  Notify MD for further orders.    SPECIAL CONSIDERATIONS:    a.  If no IV access                              w Give Glucagon IM                                        i.  1 mg if patient weighs more than 45.5 kg                                       ii.  0.5 mg if patient weighs less than 45.5 kg                              w Start IV of D5W at 50 ml/hr and give 50 ml D50 IV   b.  If no IV access and active seizure                               w Call Rapid Response   c.  If unable to gain IV access, give Glucagon IM:                              w 1 mg if patient weighs more than 45.5 kg                              w  0.5 mg if patient weighs less than 45.5 kg   d.  Notify MD for further orders.  C.  Complete smart text progress note to document intervention and follow-up CBG   1.  In [facility] patient chart, click on Notes (left side of screen)   2.  Create Progress Note   3.  Click on [employer].  In the Match box  type "hypo" and enter    4.  Double click on CH[facility] IP HYPOGLYCEMIC EVENT and enter data   5.  MD must be notified if patient is NPO or experienced severe hypoglycemia

## 2016-01-02 NOTE — ED Notes (Signed)
[facility] Team at bedside 

## 2016-01-02 NOTE — ED Notes (Signed)
Patient transported to X-ray 

## 2016-01-02 NOTE — H&P (Signed)
[facility] History and Physical  Beckett Hickmon ZOX:[id number] DOB: [birth year] DOA: 01/02/2016  Referring physician: Patient was transferred from [facility] [place]. PCP: Jeanann Lewandowsky, MD  Specialists: Nephrologist.  Chief Complaint: Chest pain.  HPI: [name] Small is a 25 y.o. female history of ESRD on hemodialysis on Monday Wednesday and Friday, hypertension, sickle cell anemia, systolic heart failure last EF measured in January 2016 was 20-25% since to be a because of chest pain. Patient has been having chest pain last 2 days which is left-sided pleuritic in nature and increases on deep inspiration with no associated productive cough or shortness of breath. Patient states she has not missed any dialysis. Labs revealed markedly elevated potassium around 8. Patient was given calcium gluconate D50 insulin following which patient's sugar dropped. Patient's repeat potassium has improved to 4. On-call nephrologist Dr. Rosana Hoes has been consulted. EKG shows sinus tachycardia. Patient has history of pulmonary embolism and also has IVC filter placed. Patient had been admitted for hyperkalemia and chest pain. Patient was admitted for chest pain in January 2016.  Review of Systems: As presented in the history of presenting illness, rest negative.  Past Medical History  Diagnosis Date  . Hemodialysis patient (HCC)   . Hypertension   . Pulmonary emboli (HCC) 01/2012    Bilateral, moderate clot burden, areas of pulmonary infarction and central necrosis  . CHF (congestive heart failure) (HCC)   . Cardiomyopathy   . Dysrhythmia     at times per pt.  . Anemia   . H/O transfusion of packed red blood cells   . 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 Polycystic kidney disease   . Cellulitis and abscess of face 03/22/2013  . Renal insufficiency   . Chronic pain   . GERD  (gastroesophageal reflux disease)   . Depression   . Hyperkalemia 09/2015  . Pelvic fracture (HCC)   . Osteoporosis   . Sickle cell anemia [facility])    Past Surgical History  Procedure Laterality Date  . Nephrectomy    . Av fistula placement    . Kidney transplant  2008    failed  . Tonsillectomy      as a child.  . Adenoidectomy    . Incision and drainage abscess Right 03/21/2013    Procedure: INCISION AND DRAINAGE RIGHT CHEEK ABSCESS REMOVAL OF FOREIGN BODY;  Surgeon: Serena Colonel, MD;  Location: [facility] OR;  Service: ENT;  Laterality: Right;  . Implantable cardioverter defibrillator implant Right 12/2014   Social History:  reports that she has been smoking Cigarettes.  She has a .5 pack-year smoking history. She has never used smokeless tobacco. She reports that she does not drink alcohol or use illicit drugs. Where does patient live Home. Can patient participate in ADLs? Yes.  Allergies  Allergen Reactions  . Tramadol Anaphylaxis  . Vicodin [Hydrocodone-Acetaminophen] Hives  . Buprenorphine Hcl Itching    Ok with oxycodone  . Iohexol Itching  . Morphine And Related Itching    Ok with oxycodone    Family History:  Family History  Problem Relation Age of Onset  . Polycystic kidney disease Father       Prior to Admission medications   Medication Sig Start Date End Date Taking? Authorizing Provider  acetaminophen (TYLENOL) 500 MG tablet Take 2 tablets (1,000 mg total) by mouth 4 (four) times daily as needed. Patient not taking: Reported on 12/26/2015  11/16/15   Stevi Barrett, PA-C  amLODipine (NORVASC) 10 MG tablet Take 10 mg by mouth daily. 09/26/15   Historical Provider, MD  amLODipine (NORVASC) 2.5 MG tablet Take 1 tablet (2.5 mg total) by mouth at bedtime. Patient not taking: Reported on 12/26/2015 10/12/15   Ripudeep Jenna Luo, MD  carvedilol (COREG) 6.25 MG tablet Take 1 tablet (6.25 mg total) by mouth 2 (two) times daily. 09/25/15   Zannie Cove, MD  cinacalcet (SENSIPAR) 30 MG  tablet Take 1 tablet (30 mg total) by mouth daily with supper. Patient taking differently: Take 30 mg by mouth daily with breakfast.  09/25/15   Zannie Cove, MD  isosorbide dinitrate (ISORDIL) 10 MG tablet Take 1 tablet (10 mg total) by mouth 3 (three) times daily. 09/25/15   Zannie Cove, MD  LORazepam (ATIVAN) 1 MG tablet Take 1 tablet (1 mg total) by mouth daily as needed for anxiety. Patient states she takes one tablet by mouth every day per patient 10/12/15   Ripudeep Jenna Luo, MD  multivitamin (RENA-VIT) TABS tablet Take 1 tablet by mouth daily.    Historical Provider, MD  naproxen (NAPROSYN) 500 MG tablet Take 1 tablet (500 mg total) by mouth 2 (two) times daily. 11/07/15   Rolland Porter, MD  oxyCODONE (ROXICODONE) 5 MG immediate release tablet Take 1 tablet (5 mg total) by mouth every 4 (four) hours as needed for severe pain. 11/13/15   Tatyana Kirichenko, PA-C  sertraline (ZOLOFT) 100 MG tablet Take 100 mg by mouth daily.    Historical Provider, MD  sevelamer carbonate (RENVELA) 800 MG tablet Take 3 tablets (2,400 mg total) by mouth 3 (three) times daily with meals. 09/25/15 09/24/16  Zannie Cove, MD    Physical Exam: Filed Vitals:   01/02/16 1800 01/02/16 1855 01/02/16 2015 01/02/16 2108  BP: 146/65 168/93 165/105 160/76  Pulse:  109 115 113  Temp:  97.8 F (36.6 C) 98.2 F (36.8 C) 97.8 F (36.6 C)  TempSrc:  Oral Oral Oral  Resp: 24 20 20 20   Height:  5\' 3"  (1.6 m)    Weight:  132 lb 0.9 oz (59.9 kg)    SpO2:  90% 98% 98%     General:  Moderately built and nourished.  Eyes: Anicteric no pallor.  ENT: No discharge from the ears eyes nose and mouth.  Neck: No mass felt. No JVD appreciated.  Cardiovascular: S1 and S2 heard.  Respiratory: No rhonchi or crepitations.  Abdomen: Soft nontender bowel sounds present.  Skin: No rash.  Musculoskeletal: No edema.  Psychiatric: Appears normal.  Neurologic: Alert awake oriented to time place and person. Moves all  extremities.  Labs on Admission:  Basic Metabolic Panel:  Recent Labs Lab 12/28/15 1830 01/02/16 1452 01/02/16 2002  NA 136 135 141  K 6.3* 8.2* 4.5  CL 100* 97* 97*  CO2 21* 21* 20*  GLUCOSE 84 91 80  BUN 65* 87* 84*  CREATININE 8.39* 11.32* 11.51*  CALCIUM 9.6 10.0 10.5*   Liver Function Tests: No results for input(s): AST, ALT, ALKPHOS, BILITOT, PROT, ALBUMIN in the last 168 hours. No results for input(s): LIPASE, AMYLASE in the last 168 hours. No results for input(s): AMMONIA in the last 168 hours. CBC:  Recent Labs Lab 12/28/15 1830 01/02/16 1452  WBC 8.1 6.5  NEUTROABS  --  4.1  HGB 10.2* 9.5*  HCT 31.9* 29.8*  MCV 91.7 90.9  PLT 156 195   Cardiac Enzymes:  Recent Labs Lab 12/28/15 1830 01/02/16 1450  TROPONINI 0.08* 0.10*    BNP (last 3 results)  Recent Labs  06/29/15 1615 09/29/15 1242  BNP >4500.0* 2492.9*    ProBNP (last 3 results) No results for input(s): PROBNP in the last 8760 hours.  CBG:  Recent Labs Lab 01/02/16 1624 01/02/16 1704 01/02/16 1807 01/02/16 1930  GLUCAP 81 105* 45* 119*    Radiological Exams on Admission: Dg Chest 2 View  01/02/2016  CLINICAL DATA:  Chest pain, shortness of Breath EXAM: CHEST  2 VIEW COMPARISON:  12/26/2015 FINDINGS: Right AICD remains in place, unchanged. Cardiomegaly. No confluent opacities or effusions. No edema. No acute bony abnormality. IMPRESSION: Cardiomegaly.  No active disease. Electronically Signed   By: Charlett Nose M.D.   On: 01/02/2016 14:29    EKG: Independently reviewed. Sinus tachycardia with nonspecific ST-T changes.  Assessment/Plan Principal Problem:   Chest pain Active Problems:   ESRD (end stage renal disease) on dialysis (HCC)   Systolic CHF, chronic (HCC)   Hyperkalemia   Anemia of chronic renal failure, stage 5 (HCC)   1. Chest pain - appears atypical and pleuritic in nature. We will cycle cardiac markers. Patient has had a 2-D echo in January 2016 which showed  EF of 25-30%. Since patient has had previous history of pulmonary embolism but check d-dimer and if positive will get VQ scan (patient is allergic to dye). Patient also has IVC filter. 2. ESRD on hemodialysis with hyperkalemia - repeat potassium is improved. Nephrologist is planning dialysis as patient's potassium may increase again. Further recommendations per nephrologist. 3. Chronic anemia with history of sickle cell anemia - no evidence of crisis. 4. Hypertension - continue home medications.   DVT Prophylaxis heparin.  Code Status: Full code.  Family Communication: Discussed with patient.  Disposition Plan: Admit for observation.    Belanna Manring N. [facility] Pager [phone].  If 7PM-7AM, please contact night-coverage [email or web address] Password [facility] 01/02/2016, 9:33 PM

## 2016-01-02 NOTE — ED Notes (Signed)
Pt states having cont chest pain, onset yesterday morning

## 2016-01-02 NOTE — Progress Notes (Signed)
Orders were initially for CT angio but patient states is allergic to Iohexol. Notified attending doctor and states he will cancel CT Angio and will order a V/Q scan to rule out Pulmonary Embolus. Will continue to monitor patient to end of shift.

## 2016-01-02 NOTE — ED Notes (Signed)
Patient here with left sided chest pain radiating to left breast with shortness of breath greater than 24 hours. Last dialysis treatment was friday

## 2016-01-02 NOTE — ED Notes (Signed)
Last Dialysis: Friday

## 2016-01-02 NOTE — ED Notes (Signed)
Provider notified of pts pain status

## 2016-01-02 NOTE — ED Notes (Signed)
Restricted Extremity Bracelet on left arm: AV Graft left upper arm, thrill/ bruit present

## 2016-01-02 NOTE — Consult Note (Signed)
Renal Service Consult Note [facility]  [name] Small 01/02/2016 [name] Small D Requesting Physician:  Dr Jerral Ralph  Reason for Consult:  ESRD pt with hyperkalemia and gen weakness HPI: The patient is a 25 y.o. year-old with hx of HTN, CM, anemia, hx failed renal Tx (2008> 2012) sp Tx nephrectomy, PE 2013, ICD implant.  Patient felt weak and presented to ED.  Her K was 8.2, and she was rx'd with IV insulin/ glucose/ Ca and inhaled albuterol.  EKG showed sinus tach, no QRS widening, some ST depression anterolat leads.  Trop 0.08, then 0.10. Some chest pain.  No n/v/d, no abd pain or SOB.  No cough/ wheezing, swelling.  Did have "a pan of lasagna" which she ate pretty much every day last week, she didn't realize tomato sauce was a high-K food.    She has been wihtout an OP HD unit for some time, but recently has been accepted at [facility] in [facility] and says she has an appt with them tomorrow regarding starting outpatient HD.      ROS  denies CP  no joint pain   no HA  no blurry vision  no rash  no diarrhea  no nausea/ vomiting  no dysuria  no difficulty voiding  no change in urine color    Past Medical History  Past Medical History  Diagnosis Date  . Hemodialysis patient (HCC)   . Hypertension   . Pulmonary emboli (HCC) 01/2012    Bilateral, moderate clot burden, areas of pulmonary infarction and central necrosis  . CHF (congestive heart failure) (HCC)   . Cardiomyopathy   . Dysrhythmia     at times per pt.  . Anemia   . H/O transfusion of packed red blood cells   . 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 Polycystic kidney disease   . Cellulitis and abscess of face 03/22/2013  . Renal insufficiency   . Chronic pain   . GERD (gastroesophageal reflux disease)   . Depression   . Hyperkalemia 09/2015  . Pelvic fracture (HCC)   . Osteoporosis   .  Sickle cell anemia [facility])    Past Surgical History  Past Surgical History  Procedure Laterality Date  . Nephrectomy    . Av fistula placement    . Kidney transplant  2008    failed  . Tonsillectomy      as a child.  . Adenoidectomy    . Incision and drainage abscess Right 03/21/2013    Procedure: INCISION AND DRAINAGE RIGHT CHEEK ABSCESS REMOVAL OF FOREIGN BODY;  Surgeon: Serena Colonel, MD;  Location: [facility] OR;  Service: ENT;  Laterality: Right;  . Implantable cardioverter defibrillator implant Right 12/2014   Family History  Family History  Problem Relation Age of Onset  . Polycystic kidney disease Father    Social History  reports that she has been smoking Cigarettes.  She has a .5 pack-year smoking history. She has never used smokeless tobacco. She reports that she does not drink alcohol or use illicit drugs. Allergies  Allergies  Allergen Reactions  . Tramadol Anaphylaxis  . Vicodin [Hydrocodone-Acetaminophen] Hives  . Buprenorphine Hcl Itching    Ok with oxycodone  . Iohexol Itching  . Morphine And Related Itching    Ok with oxycodone   Home medications Prior to Admission medications   Medication Sig Start Date End Date Taking? Authorizing Provider  acetaminophen (  TYLENOL) 500 MG tablet Take 2 tablets (1,000 mg total) by mouth 4 (four) times daily as needed. Patient not taking: Reported on 12/26/2015 11/16/15   Rolm Gala Barrett, PA-C  amLODipine (NORVASC) 10 MG tablet Take 10 mg by mouth daily. 09/26/15   Historical Provider, MD  amLODipine (NORVASC) 2.5 MG tablet Take 1 tablet (2.5 mg total) by mouth at bedtime. Patient not taking: Reported on 12/26/2015 10/12/15   Ripudeep Jenna Luo, MD  carvedilol (COREG) 6.25 MG tablet Take 1 tablet (6.25 mg total) by mouth 2 (two) times daily. 09/25/15   Zannie Cove, MD  cinacalcet (SENSIPAR) 30 MG tablet Take 1 tablet (30 mg total) by mouth daily with supper. Patient taking differently: Take 30 mg by mouth daily with breakfast.  09/25/15   Zannie Cove, MD  isosorbide dinitrate (ISORDIL) 10 MG tablet Take 1 tablet (10 mg total) by mouth 3 (three) times daily. 09/25/15   Zannie Cove, MD  LORazepam (ATIVAN) 1 MG tablet Take 1 tablet (1 mg total) by mouth daily as needed for anxiety. Patient states she takes one tablet by mouth every day per patient 10/12/15   Ripudeep Jenna Luo, MD  multivitamin (RENA-VIT) TABS tablet Take 1 tablet by mouth daily.    Historical Provider, MD  naproxen (NAPROSYN) 500 MG tablet Take 1 tablet (500 mg total) by mouth 2 (two) times daily. 11/07/15   Rolland Porter, MD  oxyCODONE (ROXICODONE) 5 MG immediate release tablet Take 1 tablet (5 mg total) by mouth every 4 (four) hours as needed for severe pain. 11/13/15   Tatyana Kirichenko, PA-C  sertraline (ZOLOFT) 100 MG tablet Take 100 mg by mouth daily.    Historical Provider, MD  sevelamer carbonate (RENVELA) 800 MG tablet Take 3 tablets (2,400 mg total) by mouth 3 (three) times daily with meals. 09/25/15 09/24/16  Zannie Cove, MD   Liver Function Tests No results for input(s): AST, ALT, ALKPHOS, BILITOT, PROT, ALBUMIN in the last 168 hours. No results for input(s): LIPASE, AMYLASE in the last 168 hours. CBC  Recent Labs Lab 12/28/15 1830 01/02/16 1452  WBC 8.1 6.5  NEUTROABS  --  4.1  HGB 10.2* 9.5*  HCT 31.9* 29.8*  MCV 91.7 90.9  PLT 156 195   Basic Metabolic Panel  Recent Labs Lab 12/26/15 2127 12/28/15 1830 01/02/16 1452 01/02/16 2002  NA  --  136 135 141  K 4.4 6.3* 8.2* 4.5  CL  --  100* 97* 97*  CO2  --  21* 21* 20*  GLUCOSE  --  84 91 80  BUN  --  65* 87* 84*  CREATININE  --  8.39* 11.32* 11.51*  CALCIUM  --  9.6 10.0 10.5*    Filed Vitals:   01/02/16 1658 01/02/16 1800 01/02/16 1855 01/02/16 2015  BP: 143/76 146/65 168/93 165/105  Pulse: 122  109 115  Temp:   97.8 F (36.6 C) 98.2 F (36.8 C)  TempSrc:   Oral Oral  Resp: 26 24 20 20   Height:   5\' 3"  (1.6 m)   Weight:   59.9 kg (132 lb 0.9 oz)   SpO2: 100%  90% 98%    Exam Alert no distress T 98.2  BP 165/105   P 110   R 20  RA 98% No rash, cyanosis or gangrene Sclera anicteric, throat clear No jvd Chest clear bilat RRR no RMG ABd soft ntnd no mass or ascites Ext no edema LE's LUA AVF +bruit   2:52 PM K= 8.2 8:02  PM K = 4.5 8pm  Na 141  CO2 20  BUN 84  Creat 11.51  Ca 10.5  WBC 6k  Hb 9.5   CXR 2 pm > no active disease, CM  Assessment: 1 Hyperkalemia - improved w acute Rx, but will go back up w time 2 Chest pain/ mildly+ trop 3 ESRD last HD Friday 2 days ago 4 Vo stable, no vol excess 5 HTN uncontrolled, cont meds 6 CM/ ICD 7 Hx PE   Plan - HD tonight , low K bath, repeat K in am w further HD if needed. She may have HD scheduled at [facility] tomorrow, not sure, if so ideally she could be dc'd home early in the day.   Vinson Moselle MD [employer] pager [phone]    cell [phone] 01/02/2016, 8:49 PM

## 2016-01-02 NOTE — ED Notes (Signed)
Pt placed on cont cardiac monitoring with cont POX and int NBP 

## 2016-01-02 NOTE — ED Notes (Signed)
PA-C informed of Troponin and K level

## 2016-01-02 NOTE — ED Notes (Signed)
Phone Hand Off Report given to rec RN at [facility]

## 2016-01-02 NOTE — Plan of Care (Signed)
Called from [facility] for transfer:  25 yo female-hx of ESRD-without a stable outpatient HD unit at this time-just moved back from [place]-comes to med center [place] with chest pain. Found to have K of 8.2. EKG has t wave inversions that are not new. Given Insulin/Calcium gluconate and Albuterol-ED PA spoke with renal-Dr Schertz-who referred the patient to the hospitalist service. Accepted to telemetry-please call Renal when patient get to [facility]-will need urgent HD.

## 2016-01-02 NOTE — ED Notes (Signed)
Pt states she feels shaky and nervous, restless at times on stretcher, CBG reasessed

## 2016-01-02 NOTE — ED Notes (Signed)
CBG- 81mg /dl

## 2016-01-03 ENCOUNTER — Observation Stay (HOSPITAL_COMMUNITY): Payer: Medicare Other

## 2016-01-03 DIAGNOSIS — R079 Chest pain, unspecified: Secondary | ICD-10-CM | POA: Diagnosis not present

## 2016-01-03 DIAGNOSIS — Z992 Dependence on renal dialysis: Secondary | ICD-10-CM

## 2016-01-03 DIAGNOSIS — E875 Hyperkalemia: Secondary | ICD-10-CM | POA: Diagnosis not present

## 2016-01-03 DIAGNOSIS — N186 End stage renal disease: Secondary | ICD-10-CM | POA: Diagnosis not present

## 2016-01-03 LAB — BASIC METABOLIC PANEL
Anion gap: 18 — ABNORMAL HIGH (ref 5–15)
BUN: 88 mg/dL — AB (ref 6–20)
CALCIUM: 9.7 mg/dL (ref 8.9–10.3)
CHLORIDE: 97 mmol/L — AB (ref 101–111)
CO2: 24 mmol/L (ref 22–32)
CREATININE: 11.78 mg/dL — AB (ref 0.44–1.00)
GFR, EST AFRICAN AMERICAN: 5 mL/min — AB (ref >60)
GFR, EST NON AFRICAN AMERICAN: 4 mL/min — AB (ref >60)
Glucose, Bld: 89 mg/dL (ref 65–99)
Potassium: 5.1 mmol/L (ref 3.5–5.1)
SODIUM: 139 mmol/L (ref 135–145)

## 2016-01-03 LAB — TROPONIN I
TROPONIN I: 0.27 ng/mL — AB (ref <0.031)
TROPONIN I: 0.2 ng/mL — AB (ref <0.031)

## 2016-01-03 LAB — GLUCOSE, CAPILLARY
GLUCOSE-CAPILLARY: 99 mg/dL (ref 65–99)
GLUCOSE-CAPILLARY: 93 mg/dL (ref 65–99)
GLUCOSE-CAPILLARY: 100 mg/dL — AB (ref 65–99)
Glucose-Capillary: 169 mg/dL — ABNORMAL HIGH (ref 65–99)

## 2016-01-03 LAB — CBC
HCT: 26.4 % — ABNORMAL LOW (ref 36.0–46.0)
Hemoglobin: 8.1 g/dL — ABNORMAL LOW (ref 12.0–15.0)
MCH: 27.6 pg (ref 26.0–34.0)
MCHC: 30.7 g/dL (ref 30.0–36.0)
MCV: 90.1 fL (ref 78.0–100.0)
PLATELETS: 155 10*3/uL (ref 150–400)
RBC: 2.93 MIL/uL — AB (ref 3.87–5.11)
RDW: 15.7 % — AB (ref 11.5–15.5)
WBC: 6.3 10*3/uL (ref 4.0–10.5)

## 2016-01-03 LAB — POTASSIUM: POTASSIUM: 5.7 mmol/L — AB (ref 3.5–5.1)

## 2016-01-03 MED ORDER — ALTEPLASE 2 MG IJ SOLR
2.0000 mg | Freq: Once | INTRAMUSCULAR | Status: DC | PRN
  Filled 2016-01-03: qty 2

## 2016-01-03 MED ORDER — SODIUM CHLORIDE 0.9 % IV SOLN: 100.0000 mL | INTRAVENOUS | Status: DC | PRN

## 2016-01-03 MED ORDER — TECHNETIUM TC 99M DIETHYLENETRIAME-PENTAACETIC ACID: 29.0000 | Freq: Once | INTRAVENOUS | Status: DC | PRN

## 2016-01-03 MED ORDER — PENTAFLUOROPROP-TETRAFLUOROETH EX AERO: 1.0000 "application " | INHALATION_SPRAY | CUTANEOUS | Status: DC | PRN

## 2016-01-03 MED ORDER — TECHNETIUM TO 99M ALBUMIN AGGREGATED
4.0000 | Freq: Once | INTRAVENOUS | Status: AC | PRN
  Administered 2016-01-03: 4 via INTRAVENOUS

## 2016-01-03 MED ORDER — HYDROMORPHONE HCL 1 MG/ML IJ SOLN
INTRAMUSCULAR | Status: AC
  Administered 2016-01-03: 0.5 mg via INTRAVENOUS
  Filled 2016-01-03: qty 1

## 2016-01-03 MED ORDER — HEPARIN SODIUM (PORCINE) 1000 UNIT/ML DIALYSIS
2000.0000 [IU] | INTRAMUSCULAR | Status: DC | PRN
  Filled 2016-01-03: qty 2

## 2016-01-03 MED ORDER — LIDOCAINE-PRILOCAINE 2.5-2.5 % EX CREA
1.0000 "application " | TOPICAL_CREAM | CUTANEOUS | Status: DC | PRN
  Filled 2016-01-03: qty 5

## 2016-01-03 MED ORDER — LIDOCAINE HCL (PF) 1 % IJ SOLN: 5.0000 mL | INTRAMUSCULAR | Status: DC | PRN

## 2016-01-03 MED ORDER — HEPARIN SODIUM (PORCINE) 1000 UNIT/ML DIALYSIS
1000.0000 [IU] | INTRAMUSCULAR | Status: DC | PRN
  Filled 2016-01-03: qty 1

## 2016-01-03 NOTE — Care Management Obs Status (Signed)
MEDICARE OBSERVATION STATUS NOTIFICATION   Patient Details  Name: [name] Small MRN: [id number] Date of Birth: [birth year]   Medicare Observation Status Notification Given:  Yes    Elliot Cousin, RN 01/03/2016, 5:07 PM

## 2016-01-03 NOTE — Progress Notes (Signed)
Pt has decided to leave AMA. Risks to leaving AMA explained to pt in full detail including possibility for sudden death. Pt is AOx4 and verbalized understanding but still wishes to sign AMA. AMA papers signed. IV removed. Telemetry removed. Pt walked off floor. Dr. Roda Shutters notified.  Jilda Panda RN

## 2016-01-03 NOTE — Procedures (Signed)
I was present at this dialysis session. I have reviewed the session itself and made appropriate changes.   K 5.7 this AM, cont 2K bath.  Qb 350 AVF.  UF goal 3L.  Pt this AM says she didn't have appt for HD at [facility], but 'needs to call' but won't commit to when she plans to do so.  V/Q pending.   Sabra Heck  MD 01/03/2016, 8:56 AM

## 2016-01-03 NOTE — Progress Notes (Signed)
Notified hospitalist of D-dimer result -2.27.

## 2016-01-04 ENCOUNTER — Ambulatory Visit (INDEPENDENT_AMBULATORY_CARE_PROVIDER_SITE_OTHER): Payer: Medicare Other | Admitting: Internal Medicine

## 2016-01-04 ENCOUNTER — Encounter: Payer: Self-pay | Admitting: Internal Medicine

## 2016-01-04 VITALS — BP 139/89 | HR 84 | Temp 98.8°F | Resp 18 | Ht 63.0 in | Wt 129.0 lb

## 2016-01-04 DIAGNOSIS — N186 End stage renal disease: Secondary | ICD-10-CM | POA: Diagnosis not present

## 2016-01-04 DIAGNOSIS — Z992 Dependence on renal dialysis: Secondary | ICD-10-CM

## 2016-01-04 DIAGNOSIS — M84454A Pathological fracture, pelvis, initial encounter for fracture: Secondary | ICD-10-CM

## 2016-01-04 DIAGNOSIS — D5700 Hb-SS disease with crisis, unspecified: Secondary | ICD-10-CM | POA: Diagnosis not present

## 2016-01-04 DIAGNOSIS — I5042 Chronic combined systolic (congestive) and diastolic (congestive) heart failure: Secondary | ICD-10-CM

## 2016-01-04 DIAGNOSIS — I5022 Chronic systolic (congestive) heart failure: Secondary | ICD-10-CM | POA: Insufficient documentation

## 2016-01-04 DIAGNOSIS — F32A Depression, unspecified: Secondary | ICD-10-CM

## 2016-01-04 DIAGNOSIS — Z95810 Presence of automatic (implantable) cardiac defibrillator: Secondary | ICD-10-CM

## 2016-01-04 DIAGNOSIS — F329 Major depressive disorder, single episode, unspecified: Secondary | ICD-10-CM

## 2016-01-04 DIAGNOSIS — T8612 Kidney transplant failure: Secondary | ICD-10-CM

## 2016-01-04 MED ORDER — SEVELAMER CARBONATE 800 MG PO TABS: 1600.0000 mg | ORAL_TABLET | Freq: Three times a day (TID) | ORAL | Status: DC

## 2016-01-04 MED ORDER — AMLODIPINE BESYLATE 10 MG PO TABS: 10.0000 mg | ORAL_TABLET | Freq: Every day | ORAL | Status: DC

## 2016-01-04 MED ORDER — CINACALCET HCL 30 MG PO TABS: 30.0000 mg | ORAL_TABLET | Freq: Every day | ORAL | Status: DC

## 2016-01-04 MED ORDER — ISOSORBIDE DINITRATE 10 MG PO TABS: 10.0000 mg | ORAL_TABLET | Freq: Three times a day (TID) | ORAL | Status: DC

## 2016-01-04 MED ORDER — RENA-VITE PO TABS: 1.0000 | ORAL_TABLET | Freq: Every day | ORAL | Status: DC

## 2016-01-04 MED ORDER — CARVEDILOL 6.25 MG PO TABS: 6.2500 mg | ORAL_TABLET | Freq: Two times a day (BID) | ORAL | Status: DC

## 2016-01-04 MED ORDER — OXYCODONE HCL 5 MG PO TABS: 5.0000 mg | ORAL_TABLET | ORAL | Status: DC | PRN

## 2016-01-04 MED ORDER — SERTRALINE HCL 100 MG PO TABS: 100.0000 mg | ORAL_TABLET | Freq: Every day | ORAL | Status: DC

## 2016-01-04 MED ORDER — LORAZEPAM 1 MG PO TABS: 1.0000 mg | ORAL_TABLET | Freq: Every day | ORAL | Status: DC | PRN

## 2016-01-04 NOTE — Progress Notes (Signed)
[name] Small, is a 25 y.o. female  ZHY:[id number]  EXB:[id number]  DOB - 01/13/1991  CC:  Chief Complaint  Patient presents with  . Establish Care       HPI: [name] Small is a 25 y.o. female here today to establish medical care. Patient moved from [place] to [place] the beginning of March, 2017, lives with parents. 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 Patient presents today with pain in chest and abdomen rated as 7 of 10 and continuous for last 4 to 5 days. Patient was admitted to [facility] on 01/02/16 for chest pain. Extensive cardiac workup completed including Ventilation-Perfusion lung scan, which showed no evidence of acute pulmonary emboli and no changes from 2014. Patient received dialysis on 01/03/2016 as inpatient. Patient left AMA following dialysis treatment. Patient has no current nephrologist and is using ED for dialysis. Patient requesting referral to [facility]. Patient was previously under care of [facility] and was discharged from practice for non-compliance. Patient states [facility] refuses to reinstate her as patient at this time. Patient was receiving dialysis Mon, Wed, Fri, in [place]. Call made to [facility] for patient referral. Information given and return call expected from [place] or [place].  Patient with additional complaint of ongoing pelvic pain related to pelvic fracture (patient reported) due to osteoporosis, patient seeks referral to orthopedic office for diagnosis and treatment.  Patient to be referred to cardiology to monitor CHF and defibrillator/pacemaker. Patient requesting medication refills.   Allergies  Allergen Reactions  .  Tramadol Anaphylaxis  . Vicodin [Hydrocodone-Acetaminophen] Hives  . Buprenorphine Hcl Itching    Ok with oxycodone  . Iohexol Itching  . Morphine And Related Itching    Ok with oxycodone   Past Medical History  Diagnosis Date  . Hemodialysis patient (HCC)   . Hypertension   . Pulmonary emboli (HCC) 01/2012    Bilateral, moderate clot burden, areas of pulmonary infarction and central necrosis  . CHF (congestive heart failure) (HCC)   . Cardiomyopathy   . Dysrhythmia     at times per pt.  . Anemia   . H/O transfusion of packed red blood cells   . 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 Polycystic kidney disease   . Cellulitis and abscess of face 03/22/2013  . Renal insufficiency   . Chronic pain   . GERD (gastroesophageal reflux disease)   . Depression   . Hyperkalemia 09/2015  . Pelvic fracture (HCC)   . Osteoporosis   . Sickle cell anemia (HCC)    No current outpatient prescriptions on file prior to visit.   No current facility-administered medications on file prior to visit.   Family History  Problem Relation Age of Onset  . Polycystic kidney disease Father    Social History   Social History  . Marital Status: Single    Spouse Name: N/A  . Number of Children: N/A  . Years of Education: N/A   Occupational History  . Not on file.   Social History Main Topics  . Smoking status: Former Smoker -- 0.00 packs/day for 1 years    Types: Cigarettes    Quit date: 12/08/2015  .  Smokeless tobacco: Never Used  . Alcohol Use: No  . Drug Use: No  . Sexual Activity: Not on file   Other Topics Concern  . Not on file   Social History Narrative   Was smoking 3 cigs per day. Lives at home with roommates, has some family locally.     Review of Systems: Constitutional: Negative for fever, chills, diaphoresis, activity change, appetite change and fatigue. HENT:  Negative for ear pain, nosebleeds, congestion, facial swelling, rhinorrhea, neck pain, neck stiffness and ear discharge.  Eyes: Negative for pain, discharge, redness, itching and visual disturbance. Respiratory: Negative for cough, choking, chest tightness, + occasional SOB, wheezing and stridor.  Gastrointestinal: Negative for abdominal distention. Genitourinary: Negative for dysuria, urgency, frequency, hematuria, flank pain, decreased urine volume, difficulty urinating and dyspareunia.  Musculoskeletal: Pelvic pain from pathological fracture from osteoporosis Neurological: Negative for dizziness, tremors, seizures, syncope, facial asymmetry, speech difficulty, weakness, light-headedness, numbness and headaches.  Psychiatric/Behavioral: Negative for hallucinations, behavioral problems, confusion, dysphoric mood, decreased concentration and agitation.    Objective:   Filed Vitals:   01/04/16 0836  BP: 139/89  Pulse: 84  Temp: 98.8 F (37.1 C)  Resp: 18    Physical Exam: Constitutional: Patient appears chronically ill-looking, Not in any obvious distress. HENT: Normocephalic, atraumatic, External right and left ear normal. Oropharynx is clear and moist.  Eyes: mild pallor, mild icterus. Neck: Normal ROM. CVS: RRR, S1/S2 +, ++ murmurs, no gallops, no carotid bruit. Tenderness in left anterior chest wall to palpation Pulmonary: Effort and breath sounds normal Abdominal: Soft. BS +, no distension, tenderness, rebound or guarding.  Musculoskeletal: Normal range of motion. Pain and diffuse tenderness in pelvic area Lymphadenopathy: No lymphadenopathy noted Neuro: Alert and Oriented x 4 Skin: Skin is warm and dry. No rash noted. Not diaphoretic. No erythema. No pallor. Left upper arm AV Fistula + bruis Psychiatric: Normal mood and affect. Behavior, judgment, thought content normal.  Lab Results  Component Value Date   WBC 6.3 01/03/2016   HGB 8.1* 01/03/2016   HCT 26.4* 01/03/2016    MCV 90.1 01/03/2016   PLT 155 01/03/2016   Lab Results  Component Value Date   CREATININE 11.78* 01/03/2016   BUN 88* 01/03/2016   NA 139 01/03/2016   K 5.7* 01/03/2016   CL 97* 01/03/2016   CO2 24 01/03/2016    Lab Results  Component Value Date   HGBA1C 5.8* 04/03/2015   Lipid Panel     Component Value Date/Time   CHOL 188 04/03/2015 0313   TRIG 167* 04/03/2015 0313   HDL 51 04/03/2015 0313   CHOLHDL 3.7 04/03/2015 0313   VLDL 33 04/03/2015 0313   LDLCALC 104* 04/03/2015 0313       Assessment and plan:   [name] Small was seen today for establish care.  Diagnoses and all orders for this visit:  ESRD on dialysis (HCC) -     LORazepam (ATIVAN) 1 MG tablet; Take 1 tablet (1 mg total) by mouth daily as needed for anxiety. Patient states she takes one tablet by mouth every day per patient -     cinacalcet (SENSIPAR) 30 MG tablet; Take 1 tablet (30 mg total) by mouth daily with breakfast. -     sevelamer carbonate (RENVELA) 800 MG tablet; Take 2-3 tablets (1,600-2,400 mg total) by mouth 3 (three) times daily with meals. 3 tabs with meals, 2 tabs with snacks  Sickle cell anemia with pain (HCC) -     oxyCODONE (ROXICODONE) 5 MG immediate release  tablet; Take 1 tablet (5 mg total) by mouth every 4 (four) hours as needed for severe pain. -     multivitamin (RENA-VIT) TABS tablet; Take 1 tablet by mouth daily.  Chronic combined systolic and diastolic congestive heart failure (HCC) -     isosorbide dinitrate (ISORDIL) 10 MG tablet; Take 1 tablet (10 mg total) by mouth 3 (three) times daily. -     carvedilol (COREG) 6.25 MG tablet; Take 1 tablet (6.25 mg total) by mouth 2 (two) times daily. -     amLODipine (NORVASC) 10 MG tablet; Take 1 tablet (10 mg total) by mouth daily. -     Ambulatory referral to Cardiology  AICD (automatic cardioverter/defibrillator) present  Failed kidney transplant  Pathologic pelvic fracture, initial encounter -     Ambulatory referral to Orthopedic  Surgery  Depression -     sertraline (ZOLOFT) 100 MG tablet; Take 1 tablet (100 mg total) by mouth daily.   Patient has been referred to the cardiologist, the plan to refer to electrophysiology for AICD management deferred to the cardiologist. Patient also need to follow with nephrologist, all attempts made in the clinic today to reestablish routine 3 times a week hemodialysis proved abortive, patient will [facility] to get accepted for routine dialysis. She is also looking for ways to restart peritoneal dialysis for convenience  Return in about 4 weeks (around 02/01/2016), or if symptoms worsen or fail to improve, for Sickle Cell Disease/Pain, CKD/ESRD.  The patient was given clear instructions to go to ER or return to medical center if symptoms don't improve, worsen or new problems develop. The patient verbalized understanding. The patient was told to call to get lab results if they haven't heard anything in the next week.    Stephanie Coup, AGNP-Student [facility] [phone] 01/04/2016, 9:35 AM   Evaluation and management procedures were performed by the Advanced Practitioner under my supervision and collaboration. I have reviewed the Advanced Practitioner's note and chart, and I agree with the management and plan.  Jeanann Lewandowsky, MD, MHA, CPE, FACP, FAAP [facility] [place], [place] [phone]   01/04/2016, 5:56 PM

## 2016-01-04 NOTE — Progress Notes (Signed)
Patient is here to establish care.  Patient is requesting refills on rena-vit, ativan and oxycodone.  Patient complains of pain in her pelvis from fracture. Pain is currently scaled at a 7. Patient also complains of joint pain.

## 2016-01-04 NOTE — Discharge Summary (Signed)
Discharge Summary  [name] Small NWG:[id number] DOB: May 28, 1991  PCP: Jeanann Lewandowsky, MD  Admit date: 01/02/2016 Discharge date: 01/04/2016  Time spent: <26mins  Patient left hospital AMA, before VQ scan result finalized.   Per care everywhere , patient has been seen at [facility], [facility] , [facility] frequently for chest pain, she was last seen at [facility] for chest pain on 12/21/2015. There has multiple documentations of patient being noncompliant.   Discharge Diagnoses:  Active Hospital Problems   Diagnosis Date Noted  . Chest pain 10/11/2015  . Anemia of chronic renal failure, stage 5 (HCC) 10/04/2015  . Hyperkalemia 02/21/2015  . Systolic CHF, chronic (HCC) 02/21/2012  . ESRD (end stage renal disease) on dialysis (HCC) 02/03/2012    Resolved Hospital Problems   Diagnosis Date Noted Date Resolved  No resolved problems to display.    Discharge Condition: stable  Diet recommendation: renal diet  Filed Weights   01/03/16 0458 01/03/16 0725 01/03/16 1037  Weight: 61.4 kg (135 lb 5.8 oz) 60.8 kg (134 lb 0.6 oz) 58.2 kg (128 lb 4.9 oz)    History of present illness:  [name] Small is a 25 y.o. female history of ESRD on hemodialysis on Monday Wednesday and Friday, hypertension, sickle cell anemia, systolic heart failure last EF measured in January 2016 was 20-25% since to be a because of chest pain. Patient has been having chest pain last 2 days which is left-sided pleuritic in nature and increases on deep inspiration with no associated productive cough or shortness of breath. Patient states she has not missed any dialysis. Labs revealed markedly elevated potassium around 8. Patient was given calcium gluconate D50 insulin following which patient's sugar dropped. Patient's repeat potassium has improved to 4. On-call nephrologist Dr. Rosana Hoes has been consulted. EKG shows sinus tachycardia. Patient has history of pulmonary embolism and also has IVC filter placed. Patient had been  admitted for hyperkalemia and chest pain. Patient was admitted for chest pain in January 2016.   Hospital Course:  Principal Problem:   Chest pain Active Problems:   ESRD (end stage renal disease) on dialysis (HCC)   Systolic CHF, chronic (HCC)   Hyperkalemia   Anemia of chronic renal failure, stage 5 (HCC)  1. Chest pain - appears atypical and pleuritic in nature. We will cycle cardiac markers. Patient has had a 2-D echo in January 2016 which showed EF of 25-30%. Since patient has had previous history of pulmonary embolism but check d-dimer and if positive will get VQ scan (patient is allergic to dye). Patient also has IVC filter. 2. ESRD on hemodialysis with hyperkalemia - repeat potassium is improved. Nephrologist is planning dialysis as patient's potassium may increase again. Further recommendations per nephrologist. 3. Chronic anemia with history of sickle cell anemia - no evidence of crisis. 4. Hypertension - continue home medications. 5. Noncompliant   Code Status: full  Family Communication: patient   Disposition Plan: patient left hospital AMA. States she need to go to [employer].    Consultants:  nephrology  Procedures:  HD  VQ scan  Antibiotics:  none    Discharge Exam: BP 147/66 mmHg  Pulse 96  Temp(Src) 97.9 F (36.6 C) (Oral)  Resp 23  Ht  (1.6 m)  Wt 58.2 kg (128 lb 4.9 oz)  BMI 22.73 kg/m2  SpO2 100%  LMP 11/25/2015 (Approximate)  General: * Cardiovascular: * Respiratory: *  Discharge Instructions You were cared for by a hospitalist during your hospital stay. If you have any questions about  your discharge medications or the care you received while you were in the hospital after you are discharged, you can call the unit and asked to speak with the hospitalist on call if the hospitalist that took care of you is not available. Once you are discharged, your primary care physician will handle any further medical issues. Please note that NO  REFILLS for any discharge medications will be authorized once you are discharged, as it is imperative that you return to your primary care physician (or establish a relationship with a primary care physician if you do not have one) for your aftercare needs so that they can reassess your need for medications and monitor your lab values.     Medication List    Notice    You have not been prescribed any medications.     Allergies  Allergen Reactions  . Tramadol Anaphylaxis  . Vicodin [Hydrocodone-Acetaminophen] Hives  . Buprenorphine Hcl Itching    Ok with oxycodone  . Iohexol Itching  . Morphine And Related Itching    Ok with oxycodone      The results of significant diagnostics from this hospitalization (including imaging, microbiology, ancillary and laboratory) are listed below for reference.    Significant Diagnostic Studies: Dg Chest 2 View  01/02/2016  CLINICAL DATA:  Chest pain, shortness of Breath EXAM: CHEST  2 VIEW COMPARISON:  12/26/2015 FINDINGS: Right AICD remains in place, unchanged. Cardiomegaly. No confluent opacities or effusions. No edema. No acute bony abnormality. IMPRESSION: Cardiomegaly.  No active disease. Electronically Signed   By: Charlett Nose M.D.   On: 01/02/2016 14:29   Dg Chest 2 View  12/26/2015  CLINICAL DATA:  Tight squeezing, midsternal CP began this morning; feels worse upon a deep breath.CHF; Pt has had defibrillator for one year; HTN meds EXAM: CHEST - 2 VIEW COMPARISON:  12/21/2015 and previous FINDINGS: Stable position of right subclavian AICD. Stable cardiomegaly. Subsegmental atelectasis or scarring laterally in the lingula. Lungs otherwise clear. No pneumothorax. No effusion. Visualized skeletal structures are unremarkable. IVC filter at the L3 level. IMPRESSION: 1. Stable cardiomegaly and postop changes.  No acute disease. Electronically Signed   By: Corlis Leak M.D.   On: 12/26/2015 14:58   Dg Pelvis 1-2 Views  12/28/2015  CLINICAL DATA:  History  of pelvic fracture. Pelvic pain. Hemodialysis. EXAM: PELVIS - 1-2 VIEW COMPARISON:  CT abdomen pelvis 10/26/2015 FINDINGS: Erosive changes of the SI joint bilaterally. No fusion of the SI joint. Fragmentation of the right pubis. This may be due to surgery with bone grafting versus chronic fracture with heterotopic bone formation. Negative for acute fracture IMPRESSION: Chronic resorptive changes of the SI joints bilaterally. Chronic changes the right pubic bone stable from the prior CT. 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 Negative for acute fracture Electronically Signed   By: Marlan Palau M.D.   On: 12/28/2015 20:56   Nm Pulmonary Perf And Vent  01/03/2016  CLINICAL DATA:  25 year old female with chest pain for 3 days. Patient on dialysis and with sickle cell disease. EXAM: NUCLEAR MEDICINE VENTILATION - PERFUSION LUNG SCAN TECHNIQUE: Ventilation images were obtained in multiple projections using inhaled aerosol Tc-43m DTPA. Perfusion images were obtained in multiple projections after intravenous injection of Tc-64m MAA. RADIOPHARMACEUTICALS:  29.0 millicuries Technetium-5m DTPA aerosol inhalation and 4.0 millicuries of Technetium-18m MAA IV COMPARISON:  04/14/2013 VQ study.  01/02/2016 chest radiograph FINDINGS: Ventilation: No focal ventilation defect. Perfusion: No wedge shaped peripheral perfusion defects to suggest acute pulmonary embolism. IMPRESSION: No evidence of  acute pulmonary emboli.  No changes from 2014. Electronically Signed   By: Harmon Pier M.D.   On: 01/03/2016 17:02    Microbiology: No results found for this or any previous visit (from the past 240 hour(s)).   Labs: Basic Metabolic Panel:  Recent Labs Lab 01/02/16 1452 01/02/16 2002 01/03/16 0313 01/03/16 0741  NA 135 141 139  --   K 8.2* 4.5 5.1 5.7*  CL 97* 97* 97*  --   CO2 21* 20* 24  --   GLUCOSE 91 80 89  --   BUN 87* 84* 88*  --   CREATININE 11.32* 11.51* 11.78*  --   CALCIUM 10.0 10.5* 9.7  --    Liver  Function Tests: No results for input(s): AST, ALT, ALKPHOS, BILITOT, PROT, ALBUMIN in the last 168 hours. No results for input(s): LIPASE, AMYLASE in the last 168 hours. No results for input(s): AMMONIA in the last 168 hours. CBC:  Recent Labs Lab 01/02/16 1452 01/02/16 2200 01/03/16 0313  WBC 6.5 6.7 6.3  NEUTROABS 4.1  --   --   HGB 9.5* 8.4* 8.1*  HCT 29.8* 27.5* 26.4*  MCV 90.9 89.6 90.1  PLT 195 175 155   Cardiac Enzymes:  Recent Labs Lab 01/02/16 1450 01/02/16 2200 01/03/16 0313 01/03/16 1117  TROPONINI 0.10* 0.20* 0.27* 0.20*   BNP: BNP (last 3 results)  Recent Labs  06/29/15 1615 09/29/15 1242  BNP >4500.0* 2492.9*    ProBNP (last 3 results) No results for input(s): PROBNP in the last 8760 hours.  CBG:  Recent Labs Lab 01/02/16 2206 01/02/16 2331 01/03/16 0124 01/03/16 0254 01/03/16 0504  GLUCAP 106* 102* 99 93 100*       Signed:  Daziyah Cogan MD, PhD  [facility] 01/04/2016, 9:47 PM

## 2016-01-04 NOTE — Patient Instructions (Signed)
Sickle Cell Anemia, Adult Sickle cell anemia is a condition in which red blood cells have an abnormal "sickle" shape. This abnormal shape shortens the cells' life span, which results in a lower than normal concentration of red blood cells in the blood. The sickle shape also causes the cells to clump together and block free blood flow through the blood vessels. As a result, the tissues and organs of the body do not receive enough oxygen. Sickle cell anemia causes organ damage and pain and increases the risk of infection. CAUSES  Sickle cell anemia is a genetic disorder. Those who receive two copies of the gene have the condition, and those who receive one copy have the trait. RISK FACTORS The sickle cell gene is most common in people whose families originated in [place]. Other areas of the globe where sickle cell trait occurs include the Mediterranean, [place] and [place], the [place], and the [place].  SIGNS AND SYMPTOMS  Pain, especially in the extremities, back, chest, or abdomen (common). The pain may start suddenly or may develop following an illness, especially if there is dehydration. Pain can also occur due to overexertion or exposure to extreme temperature changes.  Frequent severe bacterial infections, especially certain types of pneumonia and meningitis.  Pain and swelling in the hands and feet.  Decreased activity.   Loss of appetite.   Change in behavior.  Headaches.  Seizures.  Shortness of breath or difficulty breathing.  Vision changes.  Skin ulcers. Those with the trait may not have symptoms or they may have mild symptoms.  DIAGNOSIS  Sickle cell anemia is diagnosed with blood tests that demonstrate the genetic trait. It is often diagnosed during the newborn period, due to mandatory testing nationwide. A variety of blood tests, X-rays, CT scans, MRI scans, ultrasounds, and lung function tests may also be done to monitor the condition. TREATMENT  Sickle  cell anemia may be treated with:  Medicines. You may be given pain medicines, antibiotic medicines (to treat and prevent infections) or medicines to increase the production of certain types of hemoglobin.  Fluids.  Oxygen.  Blood transfusions. HOME CARE INSTRUCTIONS   Drink enough fluid to keep your urine clear or pale yellow. Increase your fluid intake in hot weather and during exercise.  Do not smoke. Smoking lowers oxygen levels in the blood.   Only take over-the-counter or prescription medicines for pain, fever, or discomfort as directed by your health care provider.  Take antibiotics as directed by your health care provider. Make sure you finish them it even if you start to feel better.   Take supplements as directed by your health care provider.   Consider wearing a medical alert bracelet. This tells anyone caring for you in an emergency of your condition.   When traveling, keep your medical information, health care provider's names, and the medicines you take with you at all times.   If you develop a fever, do not take medicines to reduce the fever right away. This could cover up a problem that is developing. Notify your health care provider.  Keep all follow-up appointments with your health care provider. Sickle cell anemia requires regular medical care. SEEK MEDICAL CARE IF: You have a fever. SEEK IMMEDIATE MEDICAL CARE IF:   You feel dizzy or faint.   You have new abdominal pain, especially on the left side near the stomach area.   You develop a persistent, often uncomfortable and painful penile erection (priapism). If this is not treated immediately it   will lead to impotence.   You have numbness your arms or legs or you have a hard time moving them.   You have a hard time with speech.   You have a fever or persistent symptoms for more than 2-3 days.   You have a fever and your symptoms suddenly get worse.   You have signs or symptoms of infection.  These include:   Chills.   Abnormal tiredness (lethargy).   Irritability.   Poor eating.   Vomiting.   You develop pain that is not helped with medicine.   You develop shortness of breath.  You have pain in your chest.   You are coughing up pus-like or bloody sputum.   You develop a stiff neck.  Your feet or hands swell or have pain.  Your abdomen appears bloated.  You develop joint pain. MAKE SURE YOU:  Understand these instructions.   This information is not intended to replace advice given to you by your health care provider. Make sure you discuss any questions you have with your health care provider.   Document Released: 01/17/2006 Document Revised: 10/30/2014 Document Reviewed: 05/21/2013 Elsevier Interactive Patient Education 2016 [employer] End-Stage Kidney Disease The kidneys are two organs that lie on either side of the spine between the middle of the back and the front of the abdomen. The kidneys:   Remove wastes and extra water from the blood.   Produce important hormones. These help keep bones strong, regulate blood pressure, and help create red blood cells.   Balance the fluids and chemicals in the blood and tissues. End-stage kidney disease occurs when the kidneys are so damaged that they cannot do their job. When the kidneys cannot do their job, life-threatening problems occur. The body cannot stay clean and strong without the help of the kidneys. In end-stage kidney disease, the kidneys cannot get better.You need a new kidney or treatments to do some of the work healthy kidneys do in order to stay alive. CAUSES  End-stage kidney disease usually occurs when a long-lasting (chronic) kidney disease gets worse. It may also occur after the kidneys are suddenly damaged (acute kidney injury).  SYMPTOMS   Swelling (edema) of the legs, ankles, or feet.   Tiredness (lethargy).   Nausea or vomiting.   Confusion.   Problems with  urination, such as:   Decreased urine production.   Frequent urination, especially at night.   Frequent accidents in children who are potty trained.   Muscle twitches and cramps.   Persistent itchiness.   Loss of appetite.   Headaches.   Abnormally dark or light skin.   Numbness in the hands or feet.   Easy bruising.   Frequent hiccups.   Menstruation stops. DIAGNOSIS  Your health care provider will measure your blood pressure and take some tests. These may include:   Urine tests.   Blood tests.   Imaging tests, such as:   An ultrasound exam.   Computed tomography (CT).  A kidney biopsy. TREATMENT  There are two treatments for end-stage kidney disease:   A procedure that removes toxic wastes from the body (dialysis).   Receiving a new kidney (kidney transplant). Both of these treatments have serious risks and consequences. Your health care provider will help you determine which treatment is best for you based on your health, age, and other factors. In addition to having dialysis or a kidney transplant, you may need to take medicines to control high blood pressure (hypertension) and cholesterol and  to decrease phosphorus levels in your blood.  HOME CARE INSTRUCTIONS  Follow your prescribed diet.   Take medicines only as directed by your health care provider.   Do not take any new medicines (prescription, over-the-counter, or nutritional supplements) unless approved by your health care provider. Many medicines can worsen your kidney damage or need to have the dose adjusted.   Keep all follow-up visits as directed by your health care provider. MAKE SURE YOU:  Understand these instructions.  Will watch your condition.  Will get help right away if you are not doing well or get worse.   This information is not intended to replace advice given to you by your health care provider. Make sure you discuss any questions you have with your health  care provider.   Document Released: 12/30/2003 Document Revised: 10/30/2014 Document Reviewed: 06/07/2012 Elsevier Interactive Patient Education [employer]

## 2016-01-08 ENCOUNTER — Encounter (HOSPITAL_BASED_OUTPATIENT_CLINIC_OR_DEPARTMENT_OTHER): Payer: Self-pay | Admitting: *Deleted

## 2016-01-08 ENCOUNTER — Emergency Department (HOSPITAL_BASED_OUTPATIENT_CLINIC_OR_DEPARTMENT_OTHER): Payer: Medicare Other

## 2016-01-08 ENCOUNTER — Inpatient Hospital Stay (HOSPITAL_BASED_OUTPATIENT_CLINIC_OR_DEPARTMENT_OTHER)
Admission: EM | Admit: 2016-01-08 | Discharge: 2016-01-11 | DRG: 640 | Disposition: A | Discharge status: Home / Self Care | Payer: Medicare Other | Attending: Family Medicine | Admitting: Family Medicine

## 2016-01-08 DIAGNOSIS — Z9119 Patient's noncompliance with other medical treatment and regimen: Secondary | ICD-10-CM

## 2016-01-08 DIAGNOSIS — D571 Sickle-cell disease without crisis: Secondary | ICD-10-CM | POA: Diagnosis present

## 2016-01-08 DIAGNOSIS — I5022 Chronic systolic (congestive) heart failure: Secondary | ICD-10-CM | POA: Diagnosis present

## 2016-01-08 DIAGNOSIS — I429 Cardiomyopathy, unspecified: Secondary | ICD-10-CM | POA: Diagnosis present

## 2016-01-08 DIAGNOSIS — Z8271 Family history of polycystic kidney: Secondary | ICD-10-CM

## 2016-01-08 DIAGNOSIS — D631 Anemia in chronic kidney disease: Secondary | ICD-10-CM | POA: Diagnosis present

## 2016-01-08 DIAGNOSIS — I130 Hypertensive heart and chronic kidney disease with heart failure and stage 1 through stage 4 chronic kidney disease, or unspecified chronic kidney disease: Secondary | ICD-10-CM | POA: Diagnosis present

## 2016-01-08 DIAGNOSIS — G8929 Other chronic pain: Secondary | ICD-10-CM | POA: Diagnosis present

## 2016-01-08 DIAGNOSIS — Z992 Dependence on renal dialysis: Secondary | ICD-10-CM

## 2016-01-08 DIAGNOSIS — Z95810 Presence of automatic (implantable) cardiac defibrillator: Secondary | ICD-10-CM

## 2016-01-08 DIAGNOSIS — T383X5A Adverse effect of insulin and oral hypoglycemic [antidiabetic] drugs, initial encounter: Secondary | ICD-10-CM | POA: Diagnosis present

## 2016-01-08 DIAGNOSIS — F17210 Nicotine dependence, cigarettes, uncomplicated: Secondary | ICD-10-CM | POA: Diagnosis present

## 2016-01-08 DIAGNOSIS — Z79899 Other long term (current) drug therapy: Secondary | ICD-10-CM

## 2016-01-08 DIAGNOSIS — R079 Chest pain, unspecified: Secondary | ICD-10-CM | POA: Diagnosis present

## 2016-01-08 DIAGNOSIS — E8889 Other specified metabolic disorders: Secondary | ICD-10-CM | POA: Diagnosis present

## 2016-01-08 DIAGNOSIS — N186 End stage renal disease: Secondary | ICD-10-CM | POA: Diagnosis present

## 2016-01-08 DIAGNOSIS — Q613 Polycystic kidney, unspecified: Secondary | ICD-10-CM | POA: Diagnosis not present

## 2016-01-08 DIAGNOSIS — E875 Hyperkalemia: Secondary | ICD-10-CM | POA: Diagnosis not present

## 2016-01-08 DIAGNOSIS — Z86711 Personal history of pulmonary embolism: Secondary | ICD-10-CM

## 2016-01-08 DIAGNOSIS — E160 Drug-induced hypoglycemia without coma: Secondary | ICD-10-CM | POA: Diagnosis present

## 2016-01-08 DIAGNOSIS — F329 Major depressive disorder, single episode, unspecified: Secondary | ICD-10-CM | POA: Diagnosis present

## 2016-01-08 DIAGNOSIS — Z888 Allergy status to other drugs, medicaments and biological substances status: Secondary | ICD-10-CM

## 2016-01-08 DIAGNOSIS — Z9115 Patient's noncompliance with renal dialysis: Secondary | ICD-10-CM | POA: Diagnosis not present

## 2016-01-08 DIAGNOSIS — T8612 Kidney transplant failure: Secondary | ICD-10-CM

## 2016-01-08 LAB — CBC
HCT: 31.6 % — ABNORMAL LOW (ref 36.0–46.0)
HCT: 29.3 % — ABNORMAL LOW (ref 36.0–46.0)
HEMATOCRIT: 33.3 % — AB (ref 36.0–46.0)
HEMOGLOBIN: 10.2 g/dL — AB (ref 12.0–15.0)
HEMOGLOBIN: 9.2 g/dL — AB (ref 12.0–15.0)
Hemoglobin: 10.6 g/dL — ABNORMAL LOW (ref 12.0–15.0)
MCH: 29 pg (ref 26.0–34.0)
MCH: 27.9 pg (ref 26.0–34.0)
MCH: 28 pg (ref 26.0–34.0)
MCHC: 32.3 g/dL (ref 30.0–36.0)
MCHC: 31.4 g/dL (ref 30.0–36.0)
MCHC: 31.8 g/dL (ref 30.0–36.0)
MCV: 89.8 fL (ref 78.0–100.0)
MCV: 88.8 fL (ref 78.0–100.0)
MCV: 88.1 fL (ref 78.0–100.0)
PLATELETS: 212 10*3/uL (ref 150–400)
Platelets: 218 10*3/uL (ref 150–400)
Platelets: 198 10*3/uL (ref 150–400)
RBC: 3.52 MIL/uL — AB (ref 3.87–5.11)
RBC: 3.3 MIL/uL — AB (ref 3.87–5.11)
RBC: 3.78 MIL/uL — ABNORMAL LOW (ref 3.87–5.11)
RDW: 15.5 % (ref 11.5–15.5)
RDW: 15.5 % (ref 11.5–15.5)
RDW: 15.6 % — ABNORMAL HIGH (ref 11.5–15.5)
WBC: 7.6 10*3/uL (ref 4.0–10.5)
WBC: 7.4 10*3/uL (ref 4.0–10.5)
WBC: 7.2 10*3/uL (ref 4.0–10.5)

## 2016-01-08 LAB — RENAL FUNCTION PANEL
Albumin: 3.2 g/dL — ABNORMAL LOW (ref 3.5–5.0)
Anion gap: 23 — ABNORMAL HIGH (ref 5–15)
BUN: 123 mg/dL — ABNORMAL HIGH (ref 6–20)
CHLORIDE: 96 mmol/L — AB (ref 101–111)
CO2: 18 mmol/L — AB (ref 22–32)
CREATININE: 15.78 mg/dL — AB (ref 0.44–1.00)
Calcium: 10.5 mg/dL — ABNORMAL HIGH (ref 8.9–10.3)
GFR calc non Af Amer: 3 mL/min — ABNORMAL LOW (ref >60)
GFR, EST AFRICAN AMERICAN: 3 mL/min — AB (ref >60)
Glucose, Bld: 223 mg/dL — ABNORMAL HIGH (ref 65–99)
Phosphorus: 9.6 mg/dL — ABNORMAL HIGH (ref 2.5–4.6)
Potassium: 7.1 mmol/L (ref 3.5–5.1)
Sodium: 137 mmol/L (ref 135–145)

## 2016-01-08 LAB — GLUCOSE, CAPILLARY
Glucose-Capillary: 34 mg/dL — CL (ref 65–99)
Glucose-Capillary: 45 mg/dL — ABNORMAL LOW (ref 65–99)
Glucose-Capillary: 141 mg/dL — ABNORMAL HIGH (ref 65–99)

## 2016-01-08 LAB — POCT I-STAT, CHEM 8
BUN: 52 mg/dL — AB (ref 6–20)
CALCIUM ION: 1.16 mmol/L (ref 1.12–1.23)
CHLORIDE: 94 mmol/L — AB (ref 101–111)
Creatinine, Ser: 8 mg/dL — ABNORMAL HIGH (ref 0.44–1.00)
GLUCOSE: 142 mg/dL — AB (ref 65–99)
HCT: 36 % (ref 36.0–46.0)
Hemoglobin: 12.2 g/dL (ref 12.0–15.0)
Potassium: 3.9 mmol/L (ref 3.5–5.1)
Sodium: 135 mmol/L (ref 135–145)
TCO2: 23 mmol/L (ref 0–100)

## 2016-01-08 LAB — BASIC METABOLIC PANEL
BUN: 115 mg/dL — ABNORMAL HIGH (ref 6–20)
CHLORIDE: 93 mmol/L — AB (ref 101–111)
CO2: 17 mmol/L — AB (ref 22–32)
Calcium: 10.1 mg/dL (ref 8.9–10.3)
Creatinine, Ser: 15.68 mg/dL — ABNORMAL HIGH (ref 0.44–1.00)
GFR calc non Af Amer: 3 mL/min — ABNORMAL LOW (ref >60)
GFR, EST AFRICAN AMERICAN: 3 mL/min — AB (ref >60)
Glucose, Bld: 73 mg/dL (ref 65–99)
Potassium: 8.6 mmol/L (ref 3.5–5.1)
Sodium: 132 mmol/L — ABNORMAL LOW (ref 135–145)

## 2016-01-08 LAB — TROPONIN I: TROPONIN I: 0.34 ng/mL — AB (ref <0.031)

## 2016-01-08 MED ORDER — ONDANSETRON HCL 4 MG/2ML IJ SOLN: 4.0000 mg | Freq: Four times a day (QID) | INTRAMUSCULAR | Status: DC | PRN

## 2016-01-08 MED ORDER — DEXTROSE 50 % IV SOLN
50.0000 mL | Freq: Once | INTRAVENOUS | Status: AC
  Administered 2016-01-08: 50 mL via INTRAVENOUS
  Filled 2016-01-08: qty 50

## 2016-01-08 MED ORDER — HYDROXYZINE HCL 25 MG PO TABS
25.0000 mg | ORAL_TABLET | Freq: Three times a day (TID) | ORAL | Status: DC | PRN
  Administered 2016-01-08: 25 mg via ORAL
  Filled 2016-01-08: qty 1

## 2016-01-08 MED ORDER — HEPARIN SODIUM (PORCINE) 1000 UNIT/ML DIALYSIS: 1000.0000 [IU] | INTRAMUSCULAR | Status: DC | PRN

## 2016-01-08 MED ORDER — OXYCODONE HCL 5 MG PO TABS
5.0000 mg | ORAL_TABLET | ORAL | Status: DC | PRN
  Administered 2016-01-09: 5 mg via ORAL
  Filled 2016-01-08: qty 1

## 2016-01-08 MED ORDER — ISOSORBIDE DINITRATE 10 MG PO TABS
10.0000 mg | ORAL_TABLET | Freq: Three times a day (TID) | ORAL | Status: DC
  Administered 2016-01-08 – 2016-01-11 (×8): 10 mg via ORAL
  Filled 2016-01-08 (×8): qty 1

## 2016-01-08 MED ORDER — SEVELAMER CARBONATE 800 MG PO TABS: 1600.0000 mg | ORAL_TABLET | Freq: Three times a day (TID) | ORAL | Status: DC

## 2016-01-08 MED ORDER — ALBUTEROL SULFATE (2.5 MG/3ML) 0.083% IN NEBU
2.5000 mg | INHALATION_SOLUTION | Freq: Once | RESPIRATORY_TRACT | Status: AC
  Administered 2016-01-08: 2.5 mg via RESPIRATORY_TRACT
  Filled 2016-01-08: qty 3

## 2016-01-08 MED ORDER — PENTAFLUOROPROP-TETRAFLUOROETH EX AERO: 1.0000 "application " | INHALATION_SPRAY | CUTANEOUS | Status: DC | PRN

## 2016-01-08 MED ORDER — LIDOCAINE-PRILOCAINE 2.5-2.5 % EX CREA
1.0000 "application " | TOPICAL_CREAM | CUTANEOUS | Status: DC | PRN
  Filled 2016-01-08: qty 5

## 2016-01-08 MED ORDER — HYDROMORPHONE HCL 1 MG/ML IJ SOLN
INTRAMUSCULAR | Status: AC
  Filled 2016-01-08: qty 1

## 2016-01-08 MED ORDER — DEXTROSE 50 % IV SOLN: 1.0000 | Freq: Once | INTRAVENOUS | Status: DC

## 2016-01-08 MED ORDER — POLYETHYLENE GLYCOL 3350 17 G PO PACK
17.0000 g | PACK | Freq: Every day | ORAL | Status: DC | PRN
Start: 2016-01-08 — End: 2016-01-11

## 2016-01-08 MED ORDER — SODIUM CHLORIDE 0.9% FLUSH
3.0000 mL | Freq: Two times a day (BID) | INTRAVENOUS | Status: DC
  Administered 2016-01-09 – 2016-01-10 (×5): 3 mL via INTRAVENOUS

## 2016-01-08 MED ORDER — SODIUM BICARBONATE 8.4 % IV SOLN
50.0000 meq | Freq: Once | INTRAVENOUS | Status: AC
  Administered 2016-01-08: 50 meq via INTRAVENOUS
  Filled 2016-01-08: qty 50

## 2016-01-08 MED ORDER — ALTEPLASE 2 MG IJ SOLR: 2.0000 mg | Freq: Once | INTRAMUSCULAR | Status: DC | PRN

## 2016-01-08 MED ORDER — LORAZEPAM 1 MG PO TABS
1.0000 mg | ORAL_TABLET | Freq: Every day | ORAL | Status: DC | PRN
  Administered 2016-01-10 – 2016-01-11 (×2): 1 mg via ORAL
  Filled 2016-01-08 (×2): qty 1

## 2016-01-08 MED ORDER — SEVELAMER CARBONATE 800 MG PO TABS
2400.0000 mg | ORAL_TABLET | Freq: Three times a day (TID) | ORAL | Status: DC
  Administered 2016-01-09 – 2016-01-11 (×7): 2400 mg via ORAL
  Filled 2016-01-08 (×6): qty 3

## 2016-01-08 MED ORDER — ENOXAPARIN SODIUM 30 MG/0.3ML ~~LOC~~ SOLN
30.0000 mg | Freq: Every day | SUBCUTANEOUS | Status: DC
  Filled 2016-01-08 (×2): qty 0.3

## 2016-01-08 MED ORDER — DEXTROSE 50 % IV SOLN
INTRAVENOUS | Status: AC
Start: 2016-01-08 — End: 2016-01-08
  Administered 2016-01-08: 18:00:00
  Filled 2016-01-08: qty 50

## 2016-01-08 MED ORDER — AMLODIPINE BESYLATE 10 MG PO TABS
10.0000 mg | ORAL_TABLET | Freq: Every day | ORAL | Status: DC
  Administered 2016-01-09 – 2016-01-11 (×3): 10 mg via ORAL
  Filled 2016-01-08 (×3): qty 1

## 2016-01-08 MED ORDER — DEXTROSE 5 % IV SOLN: INTRAVENOUS | Status: DC

## 2016-01-08 MED ORDER — SODIUM POLYSTYRENE SULFONATE 15 GM/60ML PO SUSP
30.0000 g | Freq: Two times a day (BID) | ORAL | Status: DC
  Filled 2016-01-08: qty 120

## 2016-01-08 MED ORDER — SEVELAMER CARBONATE 800 MG PO TABS
1600.0000 mg | ORAL_TABLET | ORAL | Status: DC | PRN
  Filled 2016-01-08: qty 2

## 2016-01-08 MED ORDER — HEPARIN SODIUM (PORCINE) 1000 UNIT/ML DIALYSIS: 20.0000 [IU]/kg | INTRAMUSCULAR | Status: DC | PRN

## 2016-01-08 MED ORDER — CINACALCET HCL 30 MG PO TABS
30.0000 mg | ORAL_TABLET | Freq: Every day | ORAL | Status: DC
  Administered 2016-01-09 – 2016-01-11 (×2): 30 mg via ORAL
  Filled 2016-01-08 (×3): qty 1

## 2016-01-08 MED ORDER — INSULIN ASPART 100 UNIT/ML IV SOLN
10.0000 [IU] | Freq: Once | INTRAVENOUS | Status: AC
  Administered 2016-01-08: 10 [IU] via INTRAVENOUS
  Filled 2016-01-08: qty 1

## 2016-01-08 MED ORDER — LIDOCAINE HCL (PF) 1 % IJ SOLN: 5.0000 mL | INTRAMUSCULAR | Status: DC | PRN

## 2016-01-08 MED ORDER — SODIUM CHLORIDE 0.9 % IV SOLN: 100.0000 mL | INTRAVENOUS | Status: DC | PRN

## 2016-01-08 MED ORDER — FOLIC ACID 1 MG PO TABS
1.0000 mg | ORAL_TABLET | Freq: Every day | ORAL | Status: DC
  Administered 2016-01-09 – 2016-01-11 (×3): 1 mg via ORAL
  Filled 2016-01-08 (×3): qty 1

## 2016-01-08 MED ORDER — SERTRALINE HCL 100 MG PO TABS
100.0000 mg | ORAL_TABLET | Freq: Every day | ORAL | Status: DC
  Administered 2016-01-09 – 2016-01-11 (×3): 100 mg via ORAL
  Filled 2016-01-08 (×3): qty 1

## 2016-01-08 MED ORDER — SODIUM CHLORIDE 0.9 % IV SOLN
1.0000 g | Freq: Once | INTRAVENOUS | Status: AC
  Administered 2016-01-08: 1 g via INTRAVENOUS
  Filled 2016-01-08: qty 10

## 2016-01-08 MED ORDER — NEPRO/CARBSTEADY PO LIQD
237.0000 mL | Freq: Three times a day (TID) | ORAL | Status: DC | PRN
  Filled 2016-01-08: qty 237

## 2016-01-08 MED ORDER — HYDROMORPHONE HCL 1 MG/ML IJ SOLN
1.0000 mg | INTRAMUSCULAR | Status: DC | PRN
  Administered 2016-01-08 – 2016-01-09 (×3): 1 mg via INTRAVENOUS
  Filled 2016-01-08 (×3): qty 1

## 2016-01-08 MED ORDER — ONDANSETRON HCL 4 MG PO TABS
4.0000 mg | ORAL_TABLET | Freq: Four times a day (QID) | ORAL | Status: DC | PRN
  Filled 2016-01-08: qty 1

## 2016-01-08 MED ORDER — CARVEDILOL 6.25 MG PO TABS
6.2500 mg | ORAL_TABLET | Freq: Two times a day (BID) | ORAL | Status: DC
  Administered 2016-01-09: 6.25 mg via ORAL
  Filled 2016-01-08: qty 1

## 2016-01-08 MED ORDER — CAMPHOR-MENTHOL 0.5-0.5 % EX LOTN
1.0000 "application " | TOPICAL_LOTION | Freq: Three times a day (TID) | CUTANEOUS | Status: DC | PRN
  Filled 2016-01-08 (×2): qty 222

## 2016-01-08 MED ORDER — DIPHENHYDRAMINE HCL 25 MG PO CAPS
25.0000 mg | ORAL_CAPSULE | Freq: Once | ORAL | Status: AC
Start: 2016-01-08 — End: 2016-01-08
  Administered 2016-01-08: 25 mg via ORAL
  Filled 2016-01-08: qty 1

## 2016-01-08 MED ORDER — SODIUM POLYSTYRENE SULFONATE 15 GM/60ML PO SUSP
30.0000 g | Freq: Once | ORAL | Status: AC
  Administered 2016-01-08: 30 g via ORAL
  Filled 2016-01-08: qty 120

## 2016-01-08 MED ORDER — HYDROMORPHONE HCL 1 MG/ML IJ SOLN
1.0000 mg | Freq: Once | INTRAMUSCULAR | Status: AC
  Administered 2016-01-08: 1 mg via INTRAVENOUS
  Filled 2016-01-08: qty 1

## 2016-01-08 MED ORDER — HYDROXYZINE HCL 25 MG PO TABS
ORAL_TABLET | ORAL | Status: AC
  Filled 2016-01-08: qty 1

## 2016-01-08 MED ORDER — CALCIUM CARBONATE 1250 MG/5ML PO SUSP
500.0000 mg | Freq: Four times a day (QID) | ORAL | Status: DC | PRN
  Filled 2016-01-08: qty 5

## 2016-01-08 NOTE — ED Notes (Signed)
Pt reports chest and back pain since yesterday.  Pt a dialysis pt-reports that needs dialysis, does not have a dialysis center at this time.  Last treatment was Wednesday.  Denies SOB.

## 2016-01-08 NOTE — Progress Notes (Signed)
Patient [name] Small as per report by the nurse from [facility] . Kayexalate was given and patient stated she had two bowel movement right after..Patient alert and oriented X 4 . Able to verbalizes her needs with steady gait when she arrived in the unit. Dr. Allen Norris paged immediately. Check CBG 45 at 17:42. Apple juice and graham crackers given. Rechecked at 18:03  CBG 34. D50 given and IV D50 at 50 ml.hr. Given. Dr. Allen Norris was inside the room with the patient. Called  Rapid Response Nurse  for further evaluation. Dialysis nurse arrived in the unit to pickup the patient for stat Dialysis. Report given to the Dialysis nurse prior to transfer to check CBG every 30 minutes for follow-up and to give Dilaudid for pain because  Pharmacy still verifying the order att that time.  Charge nurse notified.

## 2016-01-08 NOTE — ED Notes (Signed)
Report called to Berenice Bouton at [facility] on [address] [place].

## 2016-01-08 NOTE — ED Notes (Signed)
Pt unable to have blood draws or blood pressure taken in lt arm due to AV grafts. Blood pressure obtained in rt leg which is documented. BP taken in rt lower arm 92/69 .

## 2016-01-08 NOTE — Progress Notes (Signed)
Patient arrived in the unit via stretcher with two [facility] personnel. Orientation given to the unit. Patient verbalizes understanding.

## 2016-01-08 NOTE — Consult Note (Signed)
[name] Small 01/08/2016 Arita Miss Requesting Physician:  Mesner MD  Reason for Consult:  Hyeprkalemia HPI:  36F with no HD unit, chronic noncompliance, and chronic hyperkalemia presented to [facility] med center and found to have K of 8.6.   Pt states her K was checked yesterday in the 5s but not sure where and not in system.  She is 'attempting' to obtain an HD unit with [facility] but has not been successful and once again is quite vague about any progress or status.    She has some L sided chest pain. BP is elevated.    Last HD 3/13.  Using AVF.     Filed Weights   01/08/16 1339 01/08/16 1825  Weight: 51 kg (112 lb 7 oz) 61 kg (134 lb 7.7 oz)       ROS Balance of 12 systems is negative w/ exceptions as above  Outpt HD Orders None  PMH  Past Medical History  Diagnosis Date  . Hemodialysis patient (HCC)   . Hypertension   . Pulmonary emboli (HCC) 01/2012    Bilateral, moderate clot burden, areas of pulmonary infarction and central necrosis  . CHF (congestive heart failure) (HCC)   . Cardiomyopathy   . Dysrhythmia     at times per pt.  . Anemia   . H/O transfusion of packed red blood cells   . 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 Polycystic kidney disease   . Cellulitis and abscess of face 03/22/2013  . Renal insufficiency   . Chronic pain   . GERD (gastroesophageal reflux disease)   . Depression   . Hyperkalemia 09/2015  . Pelvic fracture (HCC)   . Osteoporosis   . Sickle cell anemia (HCC)    PSH  Past Surgical History  Procedure Laterality Date  . Nephrectomy    . Av fistula placement    . Kidney transplant  2008    failed  . Tonsillectomy      as a child.  . Adenoidectomy    . Incision and drainage abscess Right 03/21/2013    Procedure: INCISION AND DRAINAGE RIGHT CHEEK ABSCESS REMOVAL OF FOREIGN BODY;  Surgeon: Serena Colonel, MD;  Location: [facility]  OR;  Service: ENT;  Laterality: Right;  . Implantable cardioverter defibrillator implant Right 12/2014   FH  Family History  Problem Relation Age of Onset  . Polycystic kidney disease Father    SH  reports that she quit smoking about 4 weeks ago. Her smoking use included Cigarettes. She smoked 0.00 packs per day for 1 year. She has never used smokeless tobacco. She reports that she does not drink alcohol or use illicit drugs. Allergies  Allergies  Allergen Reactions  . Tramadol Anaphylaxis  . Vicodin [Hydrocodone-Acetaminophen] Hives  . Buprenorphine Hcl Itching    Ok with oxycodone  . Iohexol Itching  . Morphine And Related Itching    Ok with oxycodone   Home medications Prior to Admission medications   Medication Sig Start Date End Date Taking? Authorizing Provider  amLODipine (NORVASC) 10 MG tablet Take 1 tablet (10 mg total) by mouth daily. 01/04/16   Quentin Angst, MD  carvedilol (COREG) 6.25 MG tablet Take 1 tablet (6.25 mg total) by mouth 2 (two) times daily. 01/04/16   Quentin Angst, MD  cinacalcet (SENSIPAR) 30 MG tablet Take 1 tablet (30 mg total) by mouth daily with breakfast. 01/04/16  Quentin Angst, MD  isosorbide dinitrate (ISORDIL) 10 MG tablet Take 1 tablet (10 mg total) by mouth 3 (three) times daily. 01/04/16   Olugbemiga Annitta Needs, MD  LORazepam (ATIVAN) 1 MG tablet Take 1 tablet (1 mg total) by mouth daily as needed for anxiety. Patient states she takes one tablet by mouth every day per patient 01/04/16   Quentin Angst, MD  multivitamin (RENA-VIT) TABS tablet Take 1 tablet by mouth daily. 01/04/16   Olugbemiga Annitta Needs, MD  oxyCODONE (ROXICODONE) 5 MG immediate release tablet Take 1 tablet (5 mg total) by mouth every 4 (four) hours as needed for severe pain. 01/04/16   Quentin Angst, MD  sertraline (ZOLOFT) 100 MG tablet Take 1 tablet (100 mg total) by mouth daily. 01/04/16   Quentin Angst, MD  sevelamer carbonate (RENVELA) 800 MG tablet  Take 2-3 tablets (1,600-2,400 mg total) by mouth 3 (three) times daily with meals. 3 tabs with meals, 2 tabs with snacks 01/04/16 01/03/17  Quentin Angst, MD    Current Medications Scheduled Meds: . dextrose  1 ampule Intravenous Once  . HYDROmorphone      . sodium polystyrene  30 g Oral BID   Continuous Infusions: . dextrose     PRN Meds:.HYDROmorphone (DILAUDID) injection  CBC  Recent Labs Lab 01/02/16 1452 01/02/16 2200 01/03/16 0313 01/08/16 1449  WBC 6.5 6.7 6.3 7.6  NEUTROABS 4.1  --   --   --   HGB 9.5* 8.4* 8.1* 10.2*  HCT 29.8* 27.5* 26.4* 31.6*  MCV 90.9 89.6 90.1 89.8  PLT 195 175 155 218   Basic Metabolic Panel  Recent Labs Lab 01/02/16 1452 01/02/16 2002 01/03/16 0313 01/03/16 0741 01/08/16 1449  NA 135 141 139  --  132*  K 8.2* 4.5 5.1 5.7* 8.6*  CL 97* 97* 97*  --  93*  CO2 21* 20* 24  --  17*  GLUCOSE 91 80 89  --  73  BUN 87* 84* 88*  --  115*  CREATININE 11.32* 11.51* 11.78*  --  15.68*  CALCIUM 10.0 10.5* 9.7  --  10.1    Physical Exam  Blood pressure 201/117, pulse 101, temperature 98.2 F (36.8 C), temperature source Oral, resp. rate 20, height  (1.6 m), weight 61 kg (134 lb 7.7 oz), last menstrual period 10/22/2011, SpO2 96 %. GEN: NAD, lying in bed ENT: NCAT EYES: EOMI CV: RRR PULM: CTAB ABD: s/nt/nd EXT: trace LEE NEURO: nonfocal  A/P 1. ESRD:  1. No outpatient unit 2. NO consistent HD, she doesn't present for HD to ED as instructed 3x/wk 3. I have once again expressed my concerns for her health choices, the risks of death of no treatment, and encouraged her to come for hospitial dialysis and aggressively pursue an outpatient center 2. Hyperkalemia 1. 1K bath x 2h, then check K, if < 5 to 2K Bath 3. HTN/Vol: Needs fluid off, 4L today 4. Anemia: stable curently 5. Chronic Nonadherence with medical therapy  Sabra Heck MD 01/08/2016, 6:38 PM

## 2016-01-08 NOTE — ED Notes (Signed)
Contacted [facility] for transport to [facility] - RN aware

## 2016-01-08 NOTE — Progress Notes (Signed)
Transfer to [facility], pt with ESRD, needs stat HD. Nephrology team notified. Admit to telemetry unit.   Debbora Presto, MD  [facility] Pager [phone]  If 7PM-7AM, please contact night-coverage [email or web address] Password [email or web address]

## 2016-01-08 NOTE — Procedures (Signed)
I was present at this dialysis session. I have reviewed the session itself and made appropriate changes.   Sabra Heck  MD 01/08/2016, 6:47 PM

## 2016-01-08 NOTE — ED Notes (Signed)
Cancelled [facility] when [facility] arrived

## 2016-01-08 NOTE — Progress Notes (Signed)
Hemodialysis- Treatment complete. 4L UF without issue, bp remains elevated. Pt complaints of itching, given atarax. Vitals stable and patient has no other complaints. Report called to 3E, pt transported via bed in stable condition.

## 2016-01-08 NOTE — H&P (Signed)
[facility] History and Physical  [name] Small MGQ:[id number] DOB: [birth year] DOA: 01/08/2016  Referring physician: [employer] PCP: Jeanann Lewandowsky, MD  Specialists: Neprhology  Chief Complaint: Chest pain  HPI:  25 y/o ? 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 Milta Deiters [facility] and is in the process of getting clipped for a regular unit Recent [facility] 01/02/16 admission for chest pain-left AGAINST MEDICAL ADVICE given discrepancy regarding getting pain medications for chronic sickle cell disease. At time of discharge VQ scan was pending however found to be negative subsequent to discharge Systolic heart failure EF 10/2014, 20-25% Sickle cell anemia Hypertension Prior bilateral pulmonary emboli-02/10/2012-not on systemic anti-cognition currently [stopped taking 6 months after diagnosis] GERD Chronic noncompliance Sent over from [facility] because of missed dialysis--this is her third admission in 3 months for dialysis related issues Nephrologist consulted secondary to elevated potassium 8.6 Given Kayexalate, calcium gluconate, sodium bicarbonate at [facility] as there were noted EKG changes    On arrival to the floor at [facility] CBG was found to be 43 secondary to treatment for severe hyperkalemia While I was interviewing the patient CBG despite giving up her juice and grape juice was 34 1 amp of D50 was given and patient was started on D5 at 50 cc an hour given her ESRD diagnosis-please note that patient was able to give a history and was completely conversant and coherent at this time  She tells me that she has been having chest pain on and off for the past 2-3 days. There is no specific radiation.  She carries a diagnosis of sickle cell disease as well and this was treated in the distant past in [place] and  at that time was on Hydrea as well as folate Acid. She is not taking these medications currently  She states that Dilaudid at [facility] seemed to help with the pain in the chest and is requesting another dose  Otherwise she does not have any sputum, any chills any fever any current diarrhea She had some vomiting yesterday which was nonbloody  Review of system otherwise negative 14 organ system except as above    Past Medical History  Diagnosis Date  . Hemodialysis patient (HCC)   . Hypertension   . Pulmonary emboli (HCC) 01/2012    Bilateral, moderate clot burden, areas of pulmonary infarction and central necrosis  . CHF (congestive heart failure) (HCC)   . Cardiomyopathy   . Dysrhythmia     at times per pt.  . Anemia   . H/O transfusion of packed red blood cells   . 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 Polycystic kidney disease   . Cellulitis and abscess of face 03/22/2013  . Renal insufficiency   . Chronic pain   . GERD (gastroesophageal reflux disease)   . Depression   . Hyperkalemia 09/2015  . Pelvic fracture (HCC)   . Osteoporosis   . Sickle cell anemia [facility])    Past Surgical History  Procedure Laterality Date  . Nephrectomy    . Av fistula placement    . Kidney transplant  2008    failed  . Tonsillectomy      as a  child.  . Adenoidectomy    . Incision and drainage abscess Right 03/21/2013    Procedure: INCISION AND DRAINAGE RIGHT CHEEK ABSCESS REMOVAL OF FOREIGN BODY;  Surgeon: Serena Colonel, MD;  Location: [facility] OR;  Service: ENT;  Laterality: Right;  . Implantable cardioverter defibrillator implant Right 12/2014   Social History:  Social History   Social History Narrative   Was smoking 3 cigs per day. Lives at home with roommates, has  some family locally.     Allergies  Allergen Reactions  . Tramadol Anaphylaxis  . Vicodin [Hydrocodone-Acetaminophen] Hives  . Buprenorphine Hcl Itching    Ok with oxycodone  . Iohexol Itching  . Morphine And Related Itching    Ok with oxycodone    Family History  Problem Relation Age of Onset  . Polycystic kidney disease Father     Prior to Admission medications   Medication Sig Start Date End Date Taking? Authorizing Provider  amLODipine (NORVASC) 10 MG tablet Take 1 tablet (10 mg total) by mouth daily. 01/04/16   Quentin Angst, MD  carvedilol (COREG) 6.25 MG tablet Take 1 tablet (6.25 mg total) by mouth 2 (two) times daily. 01/04/16   Quentin Angst, MD  cinacalcet (SENSIPAR) 30 MG tablet Take 1 tablet (30 mg total) by mouth daily with breakfast. 01/04/16   Quentin Angst, MD  isosorbide dinitrate (ISORDIL) 10 MG tablet Take 1 tablet (10 mg total) by mouth 3 (three) times daily. 01/04/16   Olugbemiga Annitta Needs, MD  LORazepam (ATIVAN) 1 MG tablet Take 1 tablet (1 mg total) by mouth daily as needed for anxiety. Patient states she takes one tablet by mouth every day per patient 01/04/16   Quentin Angst, MD  multivitamin (RENA-VIT) TABS tablet Take 1 tablet by mouth daily. 01/04/16   Olugbemiga Annitta Needs, MD  oxyCODONE (ROXICODONE) 5 MG immediate release tablet Take 1 tablet (5 mg total) by mouth every 4 (four) hours as needed for severe pain. 01/04/16   Quentin Angst, MD  sertraline (ZOLOFT) 100 MG tablet Take 1 tablet (100 mg total) by mouth daily. 01/04/16   Quentin Angst, MD  sevelamer carbonate (RENVELA) 800 MG tablet Take 2-3 tablets (1,600-2,400 mg total) by mouth 3 (three) times daily with meals. 3 tabs with meals, 2 tabs with snacks 01/04/16 01/03/17  Quentin Angst, MD   Physical Exam: Filed Vitals:   01/08/16 1605 01/08/16 1630 01/08/16 1730 01/08/16 1825  BP: 141/91 156/119 167/114 201/117  Pulse: 100 103 99 101  Temp:   98.5 F (36.9 C) 98.2  F (36.8 C)  TempSrc:    Oral  Resp: 24 19  20   Height:      Weight:    61 kg (134 lb 7.7 oz)  SpO2: 98% 97% 98% 96%    On exam Cushingoid appearance, no pallor JVD elevated up to jaw S1-S2 holosystolic murmur/6 Thrill in the fistula is present No edema in lower extremities no abdominal pain Cannot appreciate rales nor rhonchi in posterior lateral lung fields Range of motion intact Mentating well and conversant and is able to give full history-neurologically grossly intact   Labs on Admission:  Basic Metabolic Panel:  Recent Labs Lab 01/02/16 1452 01/02/16 2002 01/03/16 0313 01/03/16 0741 01/08/16 1449  NA 135 141 139  --  132*  K 8.2* 4.5 5.1 5.7* 8.6*  CL 97* 97* 97*  --  93*  CO2 21* 20* 24  --  17*  GLUCOSE 91 80 89  --  73  BUN 87* 84* 88*  --  115*  CREATININE 11.32* 11.51* 11.78*  --  15.68*  CALCIUM 10.0 10.5* 9.7  --  10.1   Liver Function Tests: No results for input(s): AST, ALT, ALKPHOS, BILITOT, PROT, ALBUMIN in the last 168 hours. No results for input(s): LIPASE, AMYLASE in the last 168 hours. No results for input(s): AMMONIA in the last 168 hours. CBC:  Recent Labs Lab 01/02/16 1452 01/02/16 2200 01/03/16 0313 01/08/16 1449  WBC 6.5 6.7 6.3 7.6  NEUTROABS 4.1  --   --   --   HGB 9.5* 8.4* 8.1* 10.2*  HCT 29.8* 27.5* 26.4* 31.6*  MCV 90.9 89.6 90.1 89.8  PLT 195 175 155 218   Cardiac Enzymes:  Recent Labs Lab 01/02/16 1450 01/02/16 2200 01/03/16 0313 01/03/16 1117 01/08/16 1449  TROPONINI 0.10* 0.20* 0.27* 0.20* 0.34*    BNP (last 3 results)  Recent Labs  06/29/15 1615 09/29/15 1242  BNP >4500.0* 2492.9*    ProBNP (last 3 results) No results for input(s): PROBNP in the last 8760 hours.  CBG:  Recent Labs Lab 01/03/16 0124 01/03/16 0254 01/03/16 0504 01/08/16 1742 01/08/16 1803  GLUCAP 99 93 100* 45* 34*    Radiological Exams on Admission: Dg Chest 2 View  01/08/2016  CLINICAL DATA:  Left-sided chest pain.   Chronic renal failure EXAM: CHEST  2 VIEW COMPARISON:  January 02, 2016 and October 27, 2015 chest radiograph; chest CT October 15, 2015 FINDINGS: There is mild scarring in the left base. There is no edema or consolidation. There is moderate cardiac enlargement. Pulmonary vascularity is normal. Pacemaker lead is attached to the right ventricle. No adenopathy. No bone lesions. IMPRESSION: Persistent cardiomegaly. There may be a degree of superimposed pericardial effusion. There is scarring in the left base. No edema or consolidation. Pulmonary vascularity within normal limits. Electronically Signed   By: Bretta Bang III M.D.   On: 01/08/2016 15:11    Severe hyperkalemia secondary to missed dialysis-last dialysis session noted 3/13 when she signed out [facility] Appreciate nephrology expedient input-patient will receive emergent dialysis today. Patient was given Kayexalate at 1545 at [facility] [place]  Iatrogenic severe hypoglycemia-patient given 10 units of aspart insulin along with dextrose for membrane stabilization of hyperkalemia-while on floor patient was given 2 juices and D50 X1 ampule. Started on D5 at 50 cc per hour. Have informed dialysis nurse to call me with repeat check momentarily. If does not come above 100, will need stepdown monitoring overnight  Addendum--blood sugar 147 on dialysis unit-[facility] to recheck within half an hour  ? Sickle cell disease-unlikely diagnosis-MCV is 89 and usually MCV in these patients as below 70, hemoglobin is 10.2, platelets at 218 in addition MCHC is 32.3. Will obtain sickle cell/Hb electrophoresis to determine veracity of her claims If it is positive then we will consider hydroxyurea, folate acid For now reasonable to give Dilaudid 1 mg every 3 when necessary with low threshold to discontinue the same.  Chest pain-essential diagnosis pain from sickle cell disease [to be verified], possible acute coronary syndrome although somewhat less likely  given her young age despite her vasculopathic tendencies, or simply volume overload secondary to missed dialysis  EKG showed only tall T waves with flipped V5 with no reciprocal changes so would just monitor at this present time   she should have another echocardiogram at some point as an outpatient if remains stable--troponins are difficult to determine with her ESRD  VQ scan from  last hospitalization was negative  Likely anemia of chronic disease secondary to ESRD-iron panel has never been checked. Given that her hemoglobin however is 10.7, would not benefit at this stage from uerythrogenic factor-defer decision-making to nephrology  ESRD without HD unit-secondary to polycystic kidney disease status post attempt at cadaveric transplant in the past HD dependent currently-nephrologist is at [facility]. Recommend that [facility] coordinate game plan with social work regarding frequent admissions for hyperkalemia, missed dialysis etc.  Cushingoid facies Consider dexamethasone ACTH suppression test versus dexamethasone saliva test Patient is anuric so would not be able to do 24-hour cortisol  Prior history of systolic heart failure EF is15-20% with improvement to 20-25% 10/29/15-volume managed currently by dialysis.  Metabolic bone disease-continue Renvela 3 times a day with meals and Sensipar 30 every morning. Renal panel in a.m.-nephrologist, and   Rhetta Mura [facility] Pager [phone]  If 7PM-7AM, please contact night-coverage [email or web address] Password [facility] 01/08/2016, 6:40 PM

## 2016-01-08 NOTE — Progress Notes (Signed)
Hemodialysis- Pt brought to HD unit. Currently in no distress. HR ST 101, bp elevated 200/115, 02 96% on RA. Last cbg at 1810 per rapid response RN 34 and was given 1 amp d50 prior to arrival. On recheck after HD initiated cbg=149. D5 currently running at 50/hr. Pt complained of some chest pain, MD at bedside. Given 1mg  dilaudid. Continue to monitor closely. Currently on 1K bath, will recheck K in 90 minutes per MD orders.

## 2016-01-08 NOTE — Progress Notes (Signed)
Utilization Review Completed.[name] Small T3/18/2017  

## 2016-01-08 NOTE — ED Notes (Signed)
MD made aware of potassium level.

## 2016-01-08 NOTE — Progress Notes (Signed)
Hemodialysis- Pre HD labs drawn prior to initiation, k resulted 7.1 per lab. After 90 minutes K rechecked via I Stat, resulted 3.9. Placed on 2K bath. Continue to monitor patient.

## 2016-01-08 NOTE — ED Provider Notes (Signed)
CSN: [id number]     Arrival date & time 01/08/16  1330 History   First MD Initiated Contact with Patient 01/08/16 1504     Chief Complaint  Patient presents with  . Chest Pain     (Consider location/radiation/quality/duration/timing/severity/associated sxs/prior Treatment) Patient is a 25 y.o. female presenting with chest pain.  Chest Pain Pain location:  L chest Pain quality: sharp   Pain quality: not crushing and no pressure   Pain radiates to:  Does not radiate Pain severity:  Mild Onset quality:  Gradual Duration:  1 day Progression:  Waxing and waning Chronicity:  New Context: breathing and movement   Relieved by:  None tried Worsened by:  Nothing tried Ineffective treatments:  None tried Associated symptoms: nausea and shortness of breath   Associated symptoms: no abdominal pain, no cough, no diaphoresis, no dizziness, no fever, no headache, no heartburn, no numbness, no orthopnea, no syncope, not vomiting and no weakness     Past Medical History  Diagnosis Date  . Hemodialysis patient (HCC)   . Hypertension   . Pulmonary emboli (HCC) 01/2012    Bilateral, moderate clot burden, areas of pulmonary infarction and central necrosis  . CHF (congestive heart failure) (HCC)   . Cardiomyopathy   . Dysrhythmia     at times per pt.  . Anemia   . H/O transfusion of packed red blood cells   . 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 Polycystic kidney disease   . Cellulitis and abscess of face 03/22/2013  . Renal insufficiency   . Chronic pain   . GERD (gastroesophageal reflux disease)   . Depression   . Hyperkalemia 09/2015  . Pelvic fracture (HCC)   . Osteoporosis   . Sickle cell anemia [facility])    Past Surgical History  Procedure Laterality Date  . Nephrectomy    . Av fistula placement    . Kidney transplant  2008    failed  . Tonsillectomy      as a child.  .  Adenoidectomy    . Incision and drainage abscess Right 03/21/2013    Procedure: INCISION AND DRAINAGE RIGHT CHEEK ABSCESS REMOVAL OF FOREIGN BODY;  Surgeon: Serena Colonel, MD;  Location: [facility] OR;  Service: ENT;  Laterality: Right;  . Implantable cardioverter defibrillator implant Right 12/2014   Family History  Problem Relation Age of Onset  . Polycystic kidney disease Father    Social History  Substance Use Topics  . Smoking status: Former Smoker -- 0.00 packs/day for 1 years    Types: Cigarettes    Quit date: 12/08/2015  . Smokeless tobacco: Never Used  . Alcohol Use: No   OB History    No data available     Review of Systems  Constitutional: Negative for fever and diaphoresis.  Eyes: Negative for pain.  Respiratory: Positive for shortness of breath. Negative for cough.   Cardiovascular: Positive for chest pain. Negative for orthopnea and syncope.  Gastrointestinal: Positive for nausea. Negative for heartburn, vomiting and abdominal pain.  Neurological: Negative for dizziness, weakness, numbness and headaches.  All other systems reviewed and are negative.     Allergies  Tramadol; Vicodin; Buprenorphine hcl; Iohexol; and Morphine and related  Home Medications   Prior to Admission medications   Medication Sig Start Date End Date Taking? Authorizing Provider  amLODipine (NORVASC) 10 MG tablet Take 1 tablet (10 mg total) by  mouth daily. 01/04/16   Quentin Angst, MD  carvedilol (COREG) 6.25 MG tablet Take 1 tablet (6.25 mg total) by mouth 2 (two) times daily. 01/04/16   Quentin Angst, MD  cinacalcet (SENSIPAR) 30 MG tablet Take 1 tablet (30 mg total) by mouth daily with breakfast. 01/04/16   Quentin Angst, MD  isosorbide dinitrate (ISORDIL) 10 MG tablet Take 1 tablet (10 mg total) by mouth 3 (three) times daily. 01/04/16   Olugbemiga Annitta Needs, MD  LORazepam (ATIVAN) 1 MG tablet Take 1 tablet (1 mg total) by mouth daily as needed for anxiety. Patient states she takes  one tablet by mouth every day per patient 01/04/16   Quentin Angst, MD  multivitamin (RENA-VIT) TABS tablet Take 1 tablet by mouth daily. 01/04/16   Olugbemiga Annitta Needs, MD  oxyCODONE (ROXICODONE) 5 MG immediate release tablet Take 1 tablet (5 mg total) by mouth every 4 (four) hours as needed for severe pain. 01/04/16   Quentin Angst, MD  sertraline (ZOLOFT) 100 MG tablet Take 1 tablet (100 mg total) by mouth daily. 01/04/16   Quentin Angst, MD  sevelamer carbonate (RENVELA) 800 MG tablet Take 2-3 tablets (1,600-2,400 mg total) by mouth 3 (three) times daily with meals. 3 tabs with meals, 2 tabs with snacks 01/04/16 01/03/17  Olugbemiga E Jegede, MD   BP 189/132 mmHg  Pulse 89  Temp(Src) 97.5 F (36.4 C) (Oral)  Resp 19  Ht  (1.6 m)  Wt 112 lb 7 oz (51 kg)  BMI 19.92 kg/m2  SpO2 98%  LMP 10/22/2011 Physical Exam  Constitutional: She is oriented to person, place, and time. She appears well-developed and well-nourished.  HENT:  Head: Normocephalic and atraumatic.  Eyes: Pupils are equal, round, and reactive to light.  Neck: Normal range of motion.  Cardiovascular: Normal rate and regular rhythm.   Pulmonary/Chest: No stridor. No respiratory distress. She has no wheezes.  Abdominal: Soft. She exhibits no distension. There is no tenderness.  Musculoskeletal: Normal range of motion. She exhibits no edema or tenderness.  Neurological: She is alert and oriented to person, place, and time.  Skin: Skin is warm and dry. No rash noted. No erythema.  Nursing note and vitals reviewed.   ED Course  Procedures (including critical care time) Labs Review Labs Reviewed  BASIC METABOLIC PANEL - Abnormal; Notable for the following:    Sodium 132 (*)    Potassium 8.6 (*)    Chloride 93 (*)    CO2 17 (*)    Creatinine, Ser 15.68 (*)    GFR calc non Af Amer 3 (*)    GFR calc Af Amer 3 (*)    Anion gap >20 (*)    All other components within normal limits  CBC - Abnormal; Notable  for the following:    RBC 3.52 (*)    Hemoglobin 10.2 (*)    HCT 31.6 (*)    All other components within normal limits  TROPONIN I    Imaging Review Dg Chest 2 View  01/08/2016  CLINICAL DATA:  Left-sided chest pain.  Chronic renal failure EXAM: CHEST  2 VIEW COMPARISON:  January 02, 2016 and October 27, 2015 chest radiograph; chest CT October 15, 2015 FINDINGS: There is mild scarring in the left base. There is no edema or consolidation. There is moderate cardiac enlargement. Pulmonary vascularity is normal. Pacemaker lead is attached to the right ventricle. No adenopathy. No bone lesions. IMPRESSION: Persistent cardiomegaly. There may be  a degree of superimposed pericardial effusion. There is scarring in the left base. No edema or consolidation. Pulmonary vascularity within normal limits. Electronically Signed   By: Bretta Bang III M.D.   On: 01/08/2016 15:11   I have personally reviewed and evaluated these images and lab results as part of my medical decision-making.   EKG Interpretation   Date/Time:  Saturday January 08 2016 13:35:43 EDT Ventricular Rate:  96 PR Interval:  278 QRS Duration: 106 QT Interval:  426 QTC Calculation: 538 R Axis:   80 Text Interpretation:  Sinus rhythm with 1st degree A-V block Possible Left  atrial enlargement Incomplete left bundle branch block ST \\T \ T wave  abnormality, consider lateral ischemia Prolonged QT Abnormal ECG No  significant change since last tracing Confirmed by Karma Ganja  MD, MARTHA  [id number]) on 01/08/2016 1:49:16 PM      MDM   Final diagnoses:  Failed kidney transplant  ESRD on dialysis [facility])  Hyperkalemia  Dialysis patient, noncompliant (HCC)   ESRD on dialysis, none since Wednesday. Here CP similar to previous episode of hyperkalemia.  For her CP i considered, amongst other diagnoses, cp related to hyper k,  PE, ACS, MSK, pericarditis,  Plan to check pabs, treat pain, disposition accordingly .  Potassium of 8.6, no ECG  changes. Rest of bmp still pending. Will temporize potassium with albuterol, insulin, kayexelate and bicarg and d/w nephrology/medicine about admission.   Discussed with nephrology who agrees with emergent dialysis, d/w hospitalist and will admit to them and transfer for HD.      Marily Memos, MD 01/08/16 2052

## 2016-01-09 ENCOUNTER — Encounter (HOSPITAL_COMMUNITY): Payer: Self-pay | Admitting: Rehabilitation

## 2016-01-09 LAB — RENAL FUNCTION PANEL
Albumin: 3.2 g/dL — ABNORMAL LOW (ref 3.5–5.0)
Anion gap: 19 — ABNORMAL HIGH (ref 5–15)
BUN: 30 mg/dL — ABNORMAL HIGH (ref 6–20)
CHLORIDE: 92 mmol/L — AB (ref 101–111)
CO2: 26 mmol/L (ref 22–32)
Calcium: 10.3 mg/dL (ref 8.9–10.3)
Creatinine, Ser: 6.67 mg/dL — ABNORMAL HIGH (ref 0.44–1.00)
GFR, EST AFRICAN AMERICAN: 9 mL/min — AB (ref >60)
GFR, EST NON AFRICAN AMERICAN: 8 mL/min — AB (ref >60)
Glucose, Bld: 111 mg/dL — ABNORMAL HIGH (ref 65–99)
POTASSIUM: 3.8 mmol/L (ref 3.5–5.1)
Phosphorus: 7.4 mg/dL — ABNORMAL HIGH (ref 2.5–4.6)
Sodium: 137 mmol/L (ref 135–145)

## 2016-01-09 LAB — GLUCOSE, CAPILLARY
GLUCOSE-CAPILLARY: 103 mg/dL — AB (ref 65–99)
GLUCOSE-CAPILLARY: 103 mg/dL — AB (ref 65–99)
GLUCOSE-CAPILLARY: 152 mg/dL — AB (ref 65–99)
Glucose-Capillary: 114 mg/dL — ABNORMAL HIGH (ref 65–99)

## 2016-01-09 LAB — CREATININE, SERUM
Creatinine, Ser: 5.87 mg/dL — ABNORMAL HIGH (ref 0.44–1.00)
GFR calc Af Amer: 11 mL/min — ABNORMAL LOW (ref >60)
GFR calc non Af Amer: 9 mL/min — ABNORMAL LOW (ref >60)

## 2016-01-09 MED ORDER — OXYCODONE HCL 5 MG PO TABS
10.0000 mg | ORAL_TABLET | Freq: Once | ORAL | Status: AC
  Administered 2016-01-09: 10 mg via ORAL
  Filled 2016-01-09: qty 2

## 2016-01-09 MED ORDER — OXYCODONE HCL 5 MG PO TABS
10.0000 mg | ORAL_TABLET | ORAL | Status: DC | PRN
  Administered 2016-01-09 – 2016-01-11 (×8): 10 mg via ORAL
  Filled 2016-01-09 (×8): qty 2

## 2016-01-09 MED ORDER — CARVEDILOL 12.5 MG PO TABS
12.5000 mg | ORAL_TABLET | Freq: Two times a day (BID) | ORAL | Status: DC
  Administered 2016-01-09 – 2016-01-11 (×3): 12.5 mg via ORAL
  Filled 2016-01-09 (×4): qty 1

## 2016-01-09 NOTE — Progress Notes (Addendum)
[name] Small XBD:[id number] DOB: [birth year] DOA: 01/08/2016 PCP: Angelica Chessman, MD  Brief narrative:  25 y/o ? 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 Quay Burow [facility] and is in the process of getting clipped for a regular unit Recent [facility] 01/02/16 admission for chest [place] given discrepancy regarding getting pain medications for chronic sickle cell disease. At time of discharge VQ scan was pending however found to be negative subsequent to discharge Systolic heart failure EF 10/2014, 20-25% Sickle cell anemia Hypertension Prior bilateral pulmonary emboli-02/10/2012-not on systemic anti-cognition currently [stopped taking 6 months after diagnosis] GERD Chronic noncompliance Sent over from [facility] because of missed dialysis--this is her third admission in 3 months for dialysis related issues Nephrologist consulted secondary to elevated potassium 8.6 Given Kayexalate, calcium gluconate, sodium bicarbonate at [facility] [place] [facility] as there were noted EKG changes  On arrival to the floor  CBG was found to be 43 secondary to treatment for severe hyperkalemia Iatrogenic hypoglycemia responded well to therapy  Dialyzed emergently 3/19 Nephrology consulted  Social and behavioural issues impacting her current health care  Past medical history-As per Problem list Chart reviewed as below- reviewed  Consultants:  Nephrology  Procedures:  none  Antibiotics:  none   Subjective   Better Eating  Sitting up No cp No n/v   Objective    Interim History:   Telemetry: Sinus rhythm   Objective: Filed Vitals:   01/08/16 2229 01/08/16 2301 01/09/16 0041 01/09/16 0517  BP:  174/109 163/113 166/102 157/106  Pulse: 110 107 106 103  Temp: 97.9 F (36.6 C) 99.1 F (37.3 C)  98.4 F (36.9 C)  TempSrc:  Oral  Oral  Resp: _0 Height:      Weight: 57 kg (125 lb 10.6 oz)   55.838 kg (123 lb 1.6 oz)  SpO2: 96% 98%  99%    Intake/Output Summary (Last 24 hours) at 01/09/16 0929 Last data filed at 01/09/16 0926  Gross per 24 hour  Intake    343 ml  Output   4000 ml  Net  -3657 ml    Exam:  General: EOMI NCAT less swollen in the face JVD is diminished somewhat  Cardiovascular:  S1-S2 no murmur rub or gallop Respiratory:  clinically clear no added sound Abdomen:  soft nontender nondistended no rebound no guarding  SkNo lower extremity edema Neuro intact no deficit  Data Reviewed: Basic Metabolic Panel:  Recent Labs Lab 01/02/16 2002 01/03/16 0313  01/08/16 1449 01/08/16 1909 01/08/16 1944 01/08/16 2328 01/09/16 0239  NA 141 139  --  132* 137 135  --  137  K 4.5 5.1  < > 8.6* 7.1* 3.9  --  3.8  CL 97* 97*  --  93* 96* 94*  --  92*  CO2 20* 24  --  17* 18*  --   --  26  GLUCOSE 80 89  --  73 223* 142*  --  111*  BUN 84* 88*  --  115* 123* 52*  --  30*  CREATININE 11.51* 11.78*  --  15.68* 15.78* 8.00* 5.87* 6.67*  CALCIUM 10.5* 9.7  --  10.1 10.5*  --   --  10.3  PHOS  --   --   --   --  9.6*  --   --  7.4*  < > = values in this interval not displayed. Liver Function Tests:  Recent Labs Lab 01/08/16 1909 01/09/16 0239  ALBUMIN 3.2* 3.2*   No results for input(s): LIPASE, AMYLASE in the last 168 hours. No results for input(s): AMMONIA in the last 168 hours. CBC:  Recent Labs Lab 01/02/16 1452 01/02/16 2200 01/03/16 0313 01/08/16 1449 01/08/16 1910 01/08/16 1944 01/08/16 2328  WBC 6.5 6.7 6.3 7.6 7.4  --  7.2  NEUTROABS 4.1  --   --   --   --   --   --   HGB 9.5* 8.4* 8.1* 10.2* 9.2* 12.2 10.6*  HCT 29.8* 27.5* 26.4* 31.6* 29.3* 36.0 33.3*  MCV 90.9 89.6 90.1 89.8 88.8  --  88.1  PLT 195 175 155 218 212  --  198    Cardiac Enzymes:  Recent Labs Lab 01/02/16 1450 01/02/16 2200 01/03/16 0313 01/03/16 1117 01/08/16 1449  TROPONINI 0.10* 0.20* 0.27* 0.20* 0.34*   BNP: Invalid input(s): POCBNP CBG:  Recent Labs Lab 01/08/16 1742 01/08/16 1803 01/08/16 1937 01/09/16 0044 01/09/16 0702  GLUCAP 45* 34* 141* 152* 103*    No results found for this or any previous visit (from the past 240 hour(s)).   Studies:              All Imaging reviewed and is as per above notation   Scheduled Meds: . amLODipine  10 mg Oral Daily  . carvedilol  12.5 mg Oral BID WC  . cinacalcet  30 mg Oral Q breakfast  . dextrose  1 ampule Intravenous Once  . enoxaparin (LOVENOX) injection  30 mg Subcutaneous Daily  . folic acid  1 mg Oral Daily  . hydrOXYzine      . isosorbide dinitrate  10 mg Oral TID  . sertraline  100 mg Oral Daily  . sevelamer carbonate  2,400 mg Oral TID WC  . sodium chloride flush  3 mL Intravenous Q12H   Continuous Infusions:    Assessment/Plan:  Severe hyperkalemia secondary to missed dialysis-last dialysis session noted 3/13 when she signed out [facility] expedient input-patient will receive emergent dialysis today. Patient was given Kayexalate at 1545 at [facility] [place] [facility] EDW per patient about 37 kg-on admission charted as 55-->57kg Defer to nephrology further dialysis-sounds like we'll be getting another treatment 3/20  Iatrogenic severe hypoglycemia-patient given 10 units of aspart insulin along with dextrose for membrane stabilization of hyperkalemia Stabilized and improved   Chest pain- from overload secondary to missed dialysis  EKG showed only tall T waves with flipped V5 with no reciprocal changes so would just monitor at this present time    VQ scan from last hospitalization was negative OP echo if compliant with follow up  ? Sickle cell disease-unlikely diagnosis-MCV is 89 and usually MCV in these patients as below 70, hemoglobin is 10.2,  platelets at 218 in addition MCHC is 32.3. pending sickle cell is negative -NO narcotics on d/c -she tells me she was told she has SCD 2/2 to being told by a Doctor that she had it.  Not from any confirmatory blood test Follow Hb electrophoresis screen as OP Discontinue IV Dilaudid-OxyIR 5 mg ordered  Likely anemia of chronic disease secondary to ESRD-iron panel has never been checked.  Needs stable OP follow up prior to decision for Iron  ESRD without HD unit-secondary to polycystic kidney disease status post attempt at cadaveric transplant in the past -Unclear  where she will dialyse in the future -SHe should present to ED for management  Cushingoid facies 2/2 volume overload-resolving  Prior history of systolic heart failure EF is15-20% with improvement to 20-25% 10/29/15-volume managed currently by dialysis.  Metabolic bone disease-continue Renvela 3 times a day with meals and Sensipar 30 every morning. Renal panel in a.m.-nephrologist  Global-high risk for readmissions--when I first met her  3/18 she told me that she lives at home with her parents and that she is in a stable situation where she lives in [place].  She reported to RN 3/19 that she has no one at home with her.  When I asked her again what the situation was with regards to regular dialysis she tells me "they don't want to dialyze me at [facility] or [place]-although they were doing it at [facility] it is difficult for me to get there because every time I have to go for dialysis I have to pay an [place] and it is expensive "   Requests I do not update parents  Dr. Joelyn Oms and Posey Pronto document behavioral and other dysfunction precluding dialysis at [facility] centers Is tired of coming to the ED for dialysis-when I aks her what other options she has, she starts to cry  I have asked social workers and case management to look into her case F/u Dr. Quay Burow with [facility]   Expect d/c am 3/21    Verneita Griffes, MD  [facility] Pager [phone] 01/09/2016, 9:29 AM    LOS: 1 day

## 2016-01-09 NOTE — Progress Notes (Signed)
Admit: 01/08/2016 LOS: 1  46F no outpatient HD unit, hx/o noncompliance, presented with K > 8.5  Subjective:  HD yesterday evening, K improved UF 4L, post weight 57kg  BP improved this AM  03/18 0701 - 03/19 0700 In: 340 [P.O.:340] Out: 4000   Filed Weights   01/08/16 1825 01/08/16 2229 01/09/16 0517  Weight: 61 kg (134 lb 7.7 oz) 57 kg (125 lb 10.6 oz) 55.838 kg (123 lb 1.6 oz)    Scheduled Meds: . amLODipine  10 mg Oral Daily  . carvedilol  6.25 mg Oral BID WC  . cinacalcet  30 mg Oral Q breakfast  . dextrose  1 ampule Intravenous Once  . enoxaparin (LOVENOX) injection  30 mg Subcutaneous Daily  . folic acid  1 mg Oral Daily  . hydrOXYzine      . isosorbide dinitrate  10 mg Oral TID  . sertraline  100 mg Oral Daily  . sevelamer carbonate  2,400 mg Oral TID WC  . sodium chloride flush  3 mL Intravenous Q12H  . sodium polystyrene  30 g Oral BID   Continuous Infusions:  PRN Meds:.calcium carbonate (dosed in mg elemental calcium), camphor-menthol **AND** hydrOXYzine, feeding supplement (NEPRO CARB STEADY), HYDROmorphone (DILAUDID) injection, LORazepam, ondansetron **OR** ondansetron (ZOFRAN) IV, oxyCODONE, polyethylene glycol, sevelamer carbonate **AND** sevelamer carbonate  Current Labs: reviewed    Physical Exam:  Blood pressure 157/106, pulse 103, temperature 98.4 F (36.9 C), temperature source Oral, resp. rate 16, height 5\' 3"  (1.6 m), weight 55.838 kg (123 lb 1.6 oz), last menstrual period 10/22/2011, SpO2 99 %. GEN: NAD, lying in bed ENT: NCAT EYES: EOMI CV: RRR PULM: CTAB ABD: s/nt/nd EXT: trace LEE NEURO: nonfocal  A 1. ESRD:  1. No outpatient unit 2. NO consistent HD, she doesn't present for HD to ED as instructed 3x/wk 3. She is 'trying' to get unit with [facility]; seems to always be pending 2. Hyperkalemia, severe; resolved 3. HTN/Vol: Improved, probably still overloaded, needs more UF 4. Anemia: stable curently 5. Chronic Nonadherence  with medical therapy  P 1. HD again tomorrow if here, more UF 2. Encouraged to actively pursue obtaining outpt HD unit   Sabra Heck MD 01/09/2016, 9:12 AM   Recent Labs Lab 01/08/16 1449 01/08/16 1909 01/08/16 1944 01/08/16 2328 01/09/16 0239  NA 132* 137 135  --  137  K 8.6* 7.1* 3.9  --  3.8  CL 93* 96* 94*  --  92*  CO2 17* 18*  --   --  26  GLUCOSE 73 223* 142*  --  111*  BUN 115* 123* 52*  --  30*  CREATININE 15.68* 15.78* 8.00* 5.87* 6.67*  CALCIUM 10.1 10.5*  --   --  10.3  PHOS  --  9.6*  --   --  7.4*    Recent Labs Lab 01/02/16 1452  01/08/16 1449 01/08/16 1910 01/08/16 1944 01/08/16 2328  WBC 6.5  < > 7.6 7.4  --  7.2  NEUTROABS 4.1  --   --   --   --   --   HGB 9.5*  < > 10.2* 9.2* 12.2 10.6*  HCT 29.8*  < > 31.6* 29.3* 36.0 33.3*  MCV 90.9  < > 89.8 88.8  --  88.1  PLT 195  < > 218 212  --  198  < > = values in this interval not displayed.

## 2016-01-10 LAB — CBC WITH DIFFERENTIAL/PLATELET
BASOS ABS: 0 10*3/uL (ref 0.0–0.1)
BASOS PCT: 0 %
EOS PCT: 1 %
Eosinophils Absolute: 0.1 10*3/uL (ref 0.0–0.7)
HCT: 30.9 % — ABNORMAL LOW (ref 36.0–46.0)
Hemoglobin: 10 g/dL — ABNORMAL LOW (ref 12.0–15.0)
LYMPHS PCT: 18 %
Lymphs Abs: 1.4 10*3/uL (ref 0.7–4.0)
MCH: 29.2 pg (ref 26.0–34.0)
MCHC: 32.4 g/dL (ref 30.0–36.0)
MCV: 90.1 fL (ref 78.0–100.0)
MONO ABS: 0.8 10*3/uL (ref 0.1–1.0)
Monocytes Relative: 10 %
Neutro Abs: 5.4 10*3/uL (ref 1.7–7.7)
Neutrophils Relative %: 71 %
PLATELETS: 187 10*3/uL (ref 150–400)
RBC: 3.43 MIL/uL — AB (ref 3.87–5.11)
RDW: 16.1 % — AB (ref 11.5–15.5)
WBC: 7.7 10*3/uL (ref 4.0–10.5)

## 2016-01-10 LAB — RENAL FUNCTION PANEL
ALBUMIN: 3.2 g/dL — AB (ref 3.5–5.0)
Anion gap: 17 — ABNORMAL HIGH (ref 5–15)
BUN: 51 mg/dL — AB (ref 6–20)
CALCIUM: 10.6 mg/dL — AB (ref 8.9–10.3)
CO2: 25 mmol/L (ref 22–32)
CREATININE: 9.29 mg/dL — AB (ref 0.44–1.00)
Chloride: 97 mmol/L — ABNORMAL LOW (ref 101–111)
GFR, EST AFRICAN AMERICAN: 6 mL/min — AB (ref >60)
GFR, EST NON AFRICAN AMERICAN: 5 mL/min — AB (ref >60)
Glucose, Bld: 105 mg/dL — ABNORMAL HIGH (ref 65–99)
Phosphorus: 9.4 mg/dL — ABNORMAL HIGH (ref 2.5–4.6)
Potassium: 4.8 mmol/L (ref 3.5–5.1)
SODIUM: 139 mmol/L (ref 135–145)

## 2016-01-10 LAB — SICKLE CELL SCREEN: SICKLE CELL SCREEN: NEGATIVE

## 2016-01-10 LAB — GLUCOSE, CAPILLARY: GLUCOSE-CAPILLARY: 149 mg/dL — AB (ref 65–99)

## 2016-01-10 MED ORDER — HEPARIN SODIUM (PORCINE) 1000 UNIT/ML DIALYSIS
20.0000 [IU]/kg | INTRAMUSCULAR | Status: DC | PRN
  Filled 2016-01-10: qty 2

## 2016-01-10 NOTE — Progress Notes (Signed)
Pt requesting IV dilaudid instead of OXY IR, pt informed that per MD note, pt to take OXY IR and dilaudid has been discontinued. Pt requesting RN paged MD regarding dilaudid but agreed to take OXY for pain. MD paged.

## 2016-01-10 NOTE — Progress Notes (Signed)
CM CONSULT  Patient states that she goes to [facility] for HD but it is too expensive because she lives in [place]. Patient stated that she was kicked out of the HD centers in 2012- stated " I was in a bad place but I have changed." Patient stated that she is waiting for availability at a HD center close to her home. Patient has private insurance with Medicare, Medicaid; She states that her family lives close by and helps her as much as possible. Problem- compliancy, patient wants the HD center to adjust to her schedule for the hours/ days/ time. CM informed the patient to try to schedule her activities/ job around the HD schedule. Lots of encouragement given, not sure if patient is open to this. CM will continue to follow for DCP; B Shelba Flake [phone]

## 2016-01-10 NOTE — Progress Notes (Signed)
Patient ID: [name] Small, female   DOB: [birth year], 25 y.o.   MRN: [id number]  [facility] Progress Note   Assessment/ Plan:   1. Hyperkalemia: secondary to NO dialysis---she does not have a local HD unit and placement is "still pending" since she moved back from [place].  2. ESRD: HD today for UF/clearance---urged to call her social worker at home unit in [place] to help expedite placement. Local [place] units are not an option given her prior history of compliance/behavioral problems.  3. Anemia:Hgb acceptable- no overt loss, would do better with scheduled HD/ESA 4. CKD-MBD:High Ca/P--on sevelamer for phosphorus binding and cinacalcet for PTH suppression. Poor compliance.  5. Nutrition:albumin borderline low--- encouraged lean proteins/HD  6. Hypertension:BP fairly controlled, encouraged compliance with HD  Subjective:   No complaints at this time--- states she will likely get into an HD unit soon   Objective:   BP 122/57 mmHg  Pulse 112  Temp(Src) 99.4 F (37.4 C) (Oral)  Resp 28  Ht 5\' 3"  (1.6 m)  Wt 57.5 kg (126 lb 12.2 oz)  BMI 22.46 kg/m2  SpO2 98%  LMP 10/22/2011  Physical Exam: [id number] on HD TDH:RCBUL regular tachycardia, S1 with loud S2 Resp: CTA bilaterally, no rales/rhonchi AGT:XMIW, flat, NT, BS normal OEH:OZYYQ LE edema  Labs: BMET  Recent Labs Lab 01/08/16 1449 01/08/16 1909 01/08/16 1944 01/08/16 2328 01/09/16 0239 01/10/16 0806  NA 132* 137 135  --  137 139  K 8.6* 7.1* 3.9  --  3.8 4.8  CL 93* 96* 94*  --  92* 97*  CO2 17* 18*  --   --  26 25  GLUCOSE 73 223* 142*  --  111* 105*  BUN 115* 123* 52*  --  30* 51*  CREATININE 15.68* 15.78* 8.00* 5.87* 6.67* 9.29*  CALCIUM 10.1 10.5*  --   --  10.3 10.6*  PHOS  --  9.6*  --   --  7.4* 9.4*   CBC  Recent Labs Lab 01/08/16 1449 01/08/16 1910 01/08/16 1944 01/08/16 2328 01/10/16 0806  WBC 7.6 7.4  --  7.2 7.7  NEUTROABS  --   --   --   --  5.4  HGB 10.2* 9.2* 12.2 10.6*  10.0*  HCT 31.6* 29.3* 36.0 33.3* 30.9*  MCV 89.8 88.8  --  88.1 90.1  PLT 218 212  --  198 187   Medications:    . amLODipine  10 mg Oral Daily  . carvedilol  12.5 mg Oral BID WC  . cinacalcet  30 mg Oral Q breakfast  . dextrose  1 ampule Intravenous Once  . enoxaparin (LOVENOX) injection  30 mg Subcutaneous Daily  . folic acid  1 mg Oral Daily  . isosorbide dinitrate  10 mg Oral TID  . sertraline  100 mg Oral Daily  . sevelamer carbonate  2,400 mg Oral TID WC  . sodium chloride flush  3 mL Intravenous Q12H   Zetta Bills, MD 01/10/2016, 10:22 AM

## 2016-01-10 NOTE — Consult Note (Signed)
   [facility] Inpatient Consult   01/10/2016  Daritza Forni Dec 27, 1990 [id number]   Thank you for this consult. This patient is not eligible for [facility] Care Management Services.  Reason:  Not a beneficiary currently attributed to one of the [facility] ACO Registry populations.  Membership roster used to verify non- eligible status. Will make inpatient RNCM aware.  Raiford Noble, MSN-Ed, RN,BSN [facility] Liaison [phone]

## 2016-01-10 NOTE — Procedures (Signed)
Patient seen on Hemodialysis. QB 400, UF goal 4.5L Treatment adjusted as needed.  Zetta Bills MD [facility]. Office # [phone] Pager # [phone] 10:31 AM

## 2016-01-11 LAB — HEMOGLOBIN A1C
Hgb A1c MFr Bld: 5.8 % — ABNORMAL HIGH (ref 4.8–5.6)
Mean Plasma Glucose: 120 mg/dL

## 2016-01-11 MED ORDER — CALCIUM CARBONATE 1250 MG/5ML PO SUSP: 500.0000 mg | Freq: Four times a day (QID) | ORAL | Status: DC | PRN

## 2016-01-11 MED ORDER — OXYCODONE HCL 5 MG PO TABS
5.0000 mg | ORAL_TABLET | Freq: Once | ORAL | Status: AC
  Administered 2016-01-11: 5 mg via ORAL
  Filled 2016-01-11: qty 1

## 2016-01-11 NOTE — Progress Notes (Signed)
Patient ID: [name] Small, female   DOB: April 27, 1991, 25 y.o.   MRN: [id number]  [facility] Progress Note   Assessment/ Plan:   1. Hyperkalemia: Due to missing dialysis (she is without a home unit after choosing not to go to dialysis in [place] due to its distance away from where she lives in [place]). She reports that for the past 2-3 weeks she has been "waiting to hear back" from the social worker/doctor regarding transfer.  2. ESRD: HD today for UF/clearance---urged to call her social worker at home unit in [place] to help with placement. Local [place] units are not an option given her prior history of compliance/behavioral problems.  3. Anemia:Hgb acceptable- no overt loss, would do better with scheduled HD/ESA 4. CKD-MBD:High Ca/P--on sevelamer for phosphorus binding and cinacalcet for PTH suppression. Poor compliance.  5. Nutrition:albumin borderline low--- encouraged lean proteins/HD  6. Hypertension:BP fairly controlled, encouraged compliance with HD  Subjective:   No complaints at this time--she informs me of her multiple struggles trying to get the dialysis center. Anticipating discharge home today.    Objective:   BP 164/97 mmHg  Pulse 114  Temp(Src) 99 F (37.2 C) (Oral)  Resp 17  Ht 5\' 3"  (1.6 m)  Wt 53.4 kg (117 lb 11.6 oz)  BMI 20.86 kg/m2  SpO2 100%  LMP 10/22/2011  Physical Exam: VVO:[id number] resting in bed, intermittently somnolent during conversation RSW:NIOEV regular tachycardia, S1 with loud S2 Resp: CTA bilaterally, no rales/rhonchi OJJ:KKXF, flat, NT, BS normal GHW:EXHBZ LE edema  Labs: BMET  Recent Labs Lab 01/08/16 1449 01/08/16 1909 01/08/16 1944 01/08/16 2328 01/09/16 0239 01/10/16 0806  NA 132* 137 135  --  137 139  K 8.6* 7.1* 3.9  --  3.8 4.8  CL 93* 96* 94*  --  92* 97*  CO2 17* 18*  --   --  26 25  GLUCOSE 73 223* 142*  --  111* 105*  BUN 115* 123* 52*  --  30* 51*  CREATININE 15.68* 15.78* 8.00* 5.87*  6.67* 9.29*  CALCIUM 10.1 10.5*  --   --  10.3 10.6*  PHOS  --  9.6*  --   --  7.4* 9.4*   CBC  Recent Labs Lab 01/08/16 1449 01/08/16 1910 01/08/16 1944 01/08/16 2328 01/10/16 0806  WBC 7.6 7.4  --  7.2 7.7  NEUTROABS  --   --   --   --  5.4  HGB 10.2* 9.2* 12.2 10.6* 10.0*  HCT 31.6* 29.3* 36.0 33.3* 30.9*  MCV 89.8 88.8  --  88.1 90.1  PLT 218 212  --  198 187   Medications:    . amLODipine  10 mg Oral Daily  . carvedilol  12.5 mg Oral BID WC  . cinacalcet  30 mg Oral Q breakfast  . dextrose  1 ampule Intravenous Once  . enoxaparin (LOVENOX) injection  30 mg Subcutaneous Daily  . folic acid  1 mg Oral Daily  . isosorbide dinitrate  10 mg Oral TID  . sertraline  100 mg Oral Daily  . sevelamer carbonate  2,400 mg Oral TID WC  . sodium chloride flush  3 mL Intravenous Q12H   Zetta Bills, MD 01/11/2016, 10:15 AM

## 2016-01-11 NOTE — Discharge Summary (Addendum)
Physician Discharge Summary  [name] Small GBT:[id number] DOB: 25-23-1992 DOA: 01/08/2016  PCP: Angelica Chessman, MD  Admit date: 01/08/2016 Discharge date: 01/11/2016  Time spent: 35 minutes  Recommendations for Outpatient Follow-up:  1. No narcotics without specific indication-she carries a diagnosis of sickle cell disease however her sickle cell screen was negative and hemoglobinopathy panel as an outpatient should be followed. 2. Very high risk for readmission to hospital-symmetric and behavioral problems playing into her care 3. Consider Tx DM as OP with HbA1c in 3 months 4. High risk of death if does not continue to dialyze-we have had numerous repeated discussions regarding this  Discharge Diagnoses:  Active Problems:   Hyperkalemia   Discharge Condition: Guarded  Diet recommendation: Heart healthy low-salt  Filed Weights   01/10/16 0737 01/10/16 1150 01/11/16 0335  Weight: 57.5 kg (126 lb 12.2 oz) 51 kg (112 lb 7 oz) 53.4 kg (117 lb 11.6 oz)    History of present illness:  25 y/o ? 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 Quay Burow [facility] and is in the process of getting clipped for a regular unit.  Recent AMA 01/02/16 admission for chest pain-left AGAINST MEDICAL ADVICE given discrepancy regarding getting pain medications for chronic sickle cell disease. At time of discharge VQ scan was pending however found to be negative subsequent to discharge  Systolic heart failure EF 10/2014, 20-25% Sickle cell anemia Hypertension Prior bilateral pulmonary emboli-02/10/2012-not on systemic anti-cognition currently [stopped taking 6 months after diagnosis] GERD Chronic noncompliance Sent over from [facility]  because of missed dialysis--this is her third admission in 3 months for dialysis related issues Nephrologist consulted secondary to elevated potassium 8.6 Given Kayexalate, calcium gluconate, sodium bicarbonate at [facility] as there were noted EKG changes.  On arrival to the floor CBG was found to be 43 secondary to treatment for severe hyperkalemia Iatrogenic hypoglycemia responded well to therapy  Dialyzed emergently 3/19 Nephrology consulted  Social and behavioural issues impacting her current health care  Hospital Course:  Severe hyperkalemia secondary to missed dialysis-last dialysis session noted 3/13 when she signed out [place] [facility] expedient input-patient will receive emergent dialysis today. Patient was given Kayexalate at 1545 at [facility] EDW per patient about 52 kg  Iatrogenic severe hypoglycemia-patient given 10 units of aspart insulin along with dextrose for membrane stabilization of hyperkalemia Stabilized and improved   Chest pain- from overload secondary to missed dialysis--- no clear etiology, suspect multifactorial with her various other issues however no records and patient is elusive with regards to her diagnosis   EKG showed only tall T waves with flipped V5 with no reciprocal changes so would just monitor at this present time    VQ scan from last hospitalization was negative OP echo if compliant with follow up  On admission she told me she has sickle cell disease She then tells me that she's been seeing Dr. Jessee Avers not sure if she met Dr. Doreene Burke I encouraged her to follow-up with her regular physician and discuss these issues On day of discharge she told me she had severe pain in her left side however taking a deep breath and not cause any pain and circumduction of the shoulder as well as movement of the upper left arm was normal. The nurse was at  the bedside with me to corroborate these findings    ? Sickle cell  disease-unlikely diagnosis-MCV is 89 and usually MCV in these patients as below 70, hemoglobin is 10.2, platelets at 218 in addition MCHC is 32.3. pending sickle cell is negative -NO narcotics on d/c -she tells me she was told she has SCD 2/2 to being told by a Doctor that she had it.  Not from any confirmatory blood test Follow Hb electrophoresis screen as OP Discontinue IV Dilaudid-OxyIR 5 mg ordered   Likely anemia of chronic disease secondary to ESRD-iron panel has never been checked.   Needs stable OP follow up prior to decision for Iron  ESRD without HD unit-secondary to polycystic kidney disease status post attempt at cadaveric transplant in the past -Unclear where she will dialyse in the future -SHe should present to ED for management-she states however that she is frustrated and does not want to do this anymore and start crying -There are clearly psychosocial stressors at playing and she is hesitant about wanting me to talk to her parents to figure out a plan -She has been warned multiple times about risk to life  Cushingoid facies 2/2 volume overload-resolving  Prior history of systolic heart failure EF is15-20% with improvement to 20-25% 10/29/15-volume managed currently by dialysis.  Metabolic bone disease-continue Renvela 3 times a day with meals and Sensipar 30 every morning. Renal panel in a.m.-nephrologist  Global-high risk for readmissions--when I first met her  3/18 she told me that she lives at home with her parents and that she is in a stable situation where she lives in [place].   She reported to RN 3/19 that she has no one at home with her.  When I asked her again what the situation was with regards to regular dialysis she tells me "they don't want to dial[facility]ze [place] [facility] [place]-although they were doing it at [facility] it is difficult for me to get there because every time I have to go for dialysis I have to pay an [place] and it is expensive "    Requests I do not update parents--very guarded prognosis going forward  Dr. Joelyn Oms and Posey Pronto document behavioral and other dysfunction precluding dialysis at [facility] centers Is tired of coming to the ED for dialysis-when I aks her what other options she has, she starts to cry  I have asked social workers and case management to look into her case F/u Dr. Quay Burow with [facility]   Expect d/c am 3/21  Discharge Exam: Filed Vitals:   01/11/16 0335 01/11/16 0625  BP: 145/90 164/97  Pulse: 114 114  Temp: 99 F (37.2 C)   Resp: 17    Alert General: EOMI NCAT Cardiovascular: S1-S2 no murmur rub or gallop Respiratory: Clinically clear no added sound  Moving all 4 limbs equally  Discharge Instructions   Discharge Instructions    Diet - low sodium heart healthy    Complete by:  As directed      Discharge instructions    Complete by:  As directed   Please follow-up with nephrology at [facility]. You will need to establish care with a regular physician and no further narcotics will be given as an inpatient when admitted because the diagnosis of sickle cell disease is not confirmed as Please presented to the emergency room for dialysis on every 2-3 day schedule to avoid complications from missed dialysis-you been counseled at both nephrologist and myself regarding risk of death if you  do not get regular dialysis Please take her medications regularly We will arrange for a social worker to look into your issues as an outpatient so that we can get to help with regards to getting dialysis setup.     Increase activity slowly    Complete by:  As directed           Current Discharge Medication List    START taking these medications   Details  calcium carbonate, dosed in mg elemental calcium, 1250 (500 Ca) MG/5ML Take 5 mLs (500 mg of elemental calcium total) by mouth every 6 (six) hours as needed for indigestion. Qty: 450 mL, Refills: 30      CONTINUE these  medications which have NOT CHANGED   Details  amLODipine (NORVASC) 10 MG tablet Take 1 tablet (10 mg total) by mouth daily. Qty: 90 tablet, Refills: 3   Associated Diagnoses: Chronic combined systolic and diastolic congestive heart failure (HCC)    carvedilol (COREG) 6.25 MG tablet Take 1 tablet (6.25 mg total) by mouth 2 (two) times daily. Qty: 180 tablet, Refills: 3   Associated Diagnoses: Chronic combined systolic and diastolic congestive heart failure (HCC)    cinacalcet (SENSIPAR) 30 MG tablet Take 1 tablet (30 mg total) by mouth daily with breakfast. Qty: 90 tablet, Refills: 3   Associated Diagnoses: ESRD on dialysis (HCC)    isosorbide dinitrate (ISORDIL) 10 MG tablet Take 1 tablet (10 mg total) by mouth 3 (three) times daily. Qty: 90 tablet, Refills: 3   Associated Diagnoses: Chronic combined systolic and diastolic congestive heart failure (HCC)    LORazepam (ATIVAN) 1 MG tablet Take 1 tablet (1 mg total) by mouth daily as needed for anxiety. Patient states she takes one tablet by mouth every day per patient Qty: 30 tablet, Refills: 0   Associated Diagnoses: ESRD on dialysis ([place])    multivitamin (RENA-VIT) TABS tablet Take 1 tablet by mouth daily. Qty: 90 tablet, Refills: 3   Associated Diagnoses: Sickle cell anemia with pain (HCC)    oxyCODONE (ROXICODONE) 5 MG immediate release tablet Take 1 tablet (5 mg total) by mouth every 4 (four) hours as needed for severe pain. Qty: 90 tablet, Refills: 0   Associated Diagnoses: Sickle cell anemia with pain (HCC)    sertraline (ZOLOFT) 100 MG tablet Take 1 tablet (100 mg total) by mouth daily. Qty: 90 tablet, Refills: 3   Associated Diagnoses: Depression    sevelamer carbonate (RENVELA) 800 MG tablet Take 2-3 tablets (1,600-2,400 mg total) by mouth 3 (three) times daily with meals. 3 tabs with meals, 2 tabs with snacks Qty: 120 tablet, Refills: 3   Associated Diagnoses: ESRD on dialysis (HCC)       Allergies  Allergen  Reactions  . Tramadol Anaphylaxis  . Vicodin [Hydrocodone-Acetaminophen] Hives  . Buprenorphine Hcl Itching    Ok with oxycodone  . Iohexol Itching  . Morphine And Related Itching    Ok with oxycodone   Follow-up Information    Follow up with Angelica Chessman, MD. Go on 01/25/2016.   Specialty:  Internal Medicine   Why:  @ 11:00am   Contact information:   [place] [address] [phone]        The results of significant diagnostics from this hospitalization (including imaging, microbiology, ancillary and laboratory) are listed below for reference.    Significant Diagnostic Studies: Dg Chest 2 View  01/08/2016  CLINICAL DATA:  Left-sided chest pain.  Chronic renal failure EXAM: CHEST  2 VIEW COMPARISON:  January 02, 2016 and October 27, 2015 chest radiograph; chest CT October 15, 2015 FINDINGS: There is mild scarring in the left base. There is no edema or consolidation. There is moderate cardiac enlargement. Pulmonary vascularity is normal. Pacemaker lead is attached to the right ventricle. No adenopathy. No bone lesions. IMPRESSION: Persistent cardiomegaly. There may be a degree of superimposed pericardial effusion. There is scarring in the left base. No edema or consolidation. Pulmonary vascularity within normal limits. Electronically Signed   By: Lowella Grip III M.D.   On: 01/08/2016 15:11   Dg Chest 2 View  01/02/2016  CLINICAL DATA:  Chest pain, shortness of Breath EXAM: CHEST  2 VIEW COMPARISON:  12/26/2015 FINDINGS: Right AICD remains in place, unchanged. Cardiomegaly. No confluent opacities or effusions. No edema. No acute bony abnormality. IMPRESSION: Cardiomegaly.  No active disease. Electronically Signed   By: Rolm Baptise M.D.   On: 01/02/2016 14:29   Dg Chest 2 View  12/26/2015  CLINICAL DATA:  Tight squeezing, midsternal CP began this morning; feels worse upon a deep breath.CHF; Pt has had defibrillator for one year; HTN meds EXAM: CHEST - 2 VIEW  COMPARISON:  12/21/2015 and previous FINDINGS: Stable position of right subclavian AICD. Stable cardiomegaly. Subsegmental atelectasis or scarring laterally in the lingula. Lungs otherwise clear. No pneumothorax. No effusion. Visualized skeletal structures are unremarkable. IVC filter at the L3 level. IMPRESSION: 1. Stable cardiomegaly and postop changes.  No acute disease. Electronically Signed   By: Lucrezia Europe M.D.   On: 12/26/2015 14:58   Dg Pelvis 1-2 Views  12/28/2015  CLINICAL DATA:  History of pelvic fracture. Pelvic pain. Hemodialysis. EXAM: PELVIS - 1-2 VIEW COMPARISON:  CT abdomen pelvis 10/26/2015 FINDINGS: Erosive changes of the SI joint bilaterally. No fusion of the SI joint. Fragmentation of the right pubis. This may be due to surgery with bone grafting versus chronic fracture with heterotopic bone formation. Negative for acute fracture IMPRESSION: Chronic resorptive changes of the SI joints bilaterally. Chronic changes the right pubic bone stable from the prior CT. 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 Negative for acute fracture Electronically Signed   By: Franchot Gallo M.D.   On: 12/28/2015 20:56   Nm Pulmonary Perf And Vent  01/03/2016  CLINICAL DATA:  25 year old female with chest pain for 3 days. Patient on dialysis and with sickle cell disease. EXAM: NUCLEAR MEDICINE VENTILATION - PERFUSION LUNG SCAN TECHNIQUE: Ventilation images were obtained in multiple projections using inhaled aerosol Tc-53mDTPA. Perfusion images were obtained in multiple projections after intravenous injection of Tc-965mAA. RADIOPHARMACEUTICALS:  2940.0illicuries TeQQPYPPJKDT-26ZTPA aerosol inhalation and 4.0 millicuries of TeTIWPYKDXIP-38SAA IV COMPARISON:  04/14/2013 VQ study.  01/02/2016 chest radiograph FINDINGS: Ventilation: No focal ventilation defect. Perfusion: No wedge shaped peripheral perfusion defects to suggest acute pulmonary embolism. IMPRESSION: No evidence of acute pulmonary emboli.  No changes  from 2014. Electronically Signed   By: JeMargarette Canada.D.   On: 01/03/2016 17:02    Microbiology: No results found for this or any previous visit (from the past 240 hour(s)).   Labs: Basic Metabolic Panel:  Recent Labs Lab 01/08/16 1449 01/08/16 1909 01/08/16 1944 01/08/16 2328 01/09/16 0239 01/10/16 0806  NA 132* 137 135  --  137 139  K 8.6* 7.1* 3.9  --  3.8 4.8  CL 93* 96* 94*  --  92* 97*  CO2 17* 18*  --   --  26 25  GLUCOSE 73 223* 142*  --  111* 105*  BUN 115* 123* 52*  --  30* 51*  CREATININE 15.68* 15.78* 8.00* 5.87* 6.67* 9.29*  CALCIUM 10.1 10.5*  --   --  10.3 10.6*  PHOS  --  9.6*  --   --  7.4* 9.4*   Liver Function Tests:  Recent Labs Lab 01/08/16 1909 01/09/16 0239 01/10/16 0806  ALBUMIN 3.2* 3.2* 3.2*   No results for input(s): LIPASE, AMYLASE in the last 168 hours. No results for input(s): AMMONIA in the last 168 hours. CBC:  Recent Labs Lab 01/08/16 1449 01/08/16 1910 01/08/16 1944 01/08/16 2328 01/10/16 0806  WBC 7.6 7.4  --  7.2 7.7  NEUTROABS  --   --   --   --  5.4  HGB 10.2* 9.2* 12.2 10.6* 10.0*  HCT 31.6* 29.3* 36.0 33.3* 30.9*  MCV 89.8 88.8  --  88.1 90.1  PLT 218 212  --  198 187   Cardiac Enzymes:  Recent Labs Lab 01/08/16 1449  TROPONINI 0.34*   BNP: BNP (last 3 results)  Recent Labs  06/29/15 1615 09/29/15 1242  BNP >4500.0* 2492.9*    ProBNP (last 3 results) No results for input(s): PROBNP in the last 8760 hours.  CBG:  Recent Labs Lab 01/08/16 1937 01/09/16 0044 01/09/16 0702 01/09/16 1226 01/09/16 1553  GLUCAP 141* 152* 103* 114* 103*       Signed:  Nita Sells MD   [facility] 01/11/2016, 10:18 AM

## 2016-01-11 NOTE — Care Management Important Message (Signed)
Important Message  Patient Details  Name: [name] Small MRN: [id number] Date of Birth: 05/04/1991   Medicare Important Message Given:  Yes    Cam Dauphin P Nasha Diss 01/11/2016, 11:59 AM

## 2016-01-11 NOTE — Progress Notes (Signed)
Patient given discharge instructions and all questions answered.  IV and tele removed.  Will discharge patient when ride arrives.

## 2016-01-12 LAB — HEMOGLOBINOPATHY EVALUATION
HGB C: 0 %
Hgb A2 Quant: 1.9 % (ref 0.7–3.1)
Hgb A: 98.1 % — ABNORMAL HIGH (ref 94.0–98.0)
Hgb F Quant: 0 % (ref 0.0–2.0)
Hgb S Quant: 0 %

## 2016-01-13 ENCOUNTER — Telehealth (HOSPITAL_COMMUNITY): Payer: Self-pay | Admitting: *Deleted

## 2016-01-13 NOTE — Telephone Encounter (Signed)
Patient called to the [facility] with complaints of chest pain and stating "I would rather come there instead of the ER". Sweating, N/V and abdominal pain also. Denies diarrhea. 8/10 rating.

## 2016-01-13 NOTE — Telephone Encounter (Signed)
Called patient back regarding chest pain after speaking with L. Hollis NP about patient's symptoms. Advised to seek treatment in the ED due to symptoms expressed. Patient voiced understanding.

## 2016-01-14 ENCOUNTER — Telehealth: Payer: Self-pay

## 2016-01-14 NOTE — Telephone Encounter (Signed)
Pt has misplaced her bottle of oxycodone and needs a new bottle. She is requesting a new prescription. Please call.

## 2016-01-17 ENCOUNTER — Emergency Department (HOSPITAL_BASED_OUTPATIENT_CLINIC_OR_DEPARTMENT_OTHER): Payer: Medicare Other

## 2016-01-17 ENCOUNTER — Observation Stay (HOSPITAL_BASED_OUTPATIENT_CLINIC_OR_DEPARTMENT_OTHER)
Admission: EM | Admit: 2016-01-17 | Discharge: 2016-01-18 | Disposition: A | Discharge status: Home / Self Care | Payer: Medicare Other | Attending: Internal Medicine | Admitting: Internal Medicine

## 2016-01-17 ENCOUNTER — Encounter (HOSPITAL_BASED_OUTPATIENT_CLINIC_OR_DEPARTMENT_OTHER): Payer: Self-pay | Admitting: *Deleted

## 2016-01-17 DIAGNOSIS — N186 End stage renal disease: Secondary | ICD-10-CM | POA: Diagnosis not present

## 2016-01-17 DIAGNOSIS — I2699 Other pulmonary embolism without acute cor pulmonale: Secondary | ICD-10-CM | POA: Diagnosis present

## 2016-01-17 DIAGNOSIS — E875 Hyperkalemia: Secondary | ICD-10-CM | POA: Diagnosis present

## 2016-01-17 DIAGNOSIS — I5023 Acute on chronic systolic (congestive) heart failure: Secondary | ICD-10-CM | POA: Diagnosis present

## 2016-01-17 DIAGNOSIS — Z992 Dependence on renal dialysis: Secondary | ICD-10-CM | POA: Diagnosis not present

## 2016-01-17 DIAGNOSIS — R079 Chest pain, unspecified: Principal | ICD-10-CM | POA: Diagnosis present

## 2016-01-17 HISTORY — DX: Opioid abuse, uncomplicated: F11.10

## 2016-01-17 LAB — COMPREHENSIVE METABOLIC PANEL
ALBUMIN: 3.6 g/dL (ref 3.5–5.0)
ALT: 15 U/L (ref 14–54)
AST: 30 U/L (ref 15–41)
Alkaline Phosphatase: 425 U/L — ABNORMAL HIGH (ref 38–126)
Anion gap: 15 (ref 5–15)
BILIRUBIN TOTAL: 0.8 mg/dL (ref 0.3–1.2)
BUN: 61 mg/dL — AB (ref 6–20)
CHLORIDE: 97 mmol/L — AB (ref 101–111)
CO2: 24 mmol/L (ref 22–32)
CREATININE: 10.31 mg/dL — AB (ref 0.44–1.00)
Calcium: 9.4 mg/dL (ref 8.9–10.3)
GFR calc Af Amer: 5 mL/min — ABNORMAL LOW (ref >60)
GFR, EST NON AFRICAN AMERICAN: 5 mL/min — AB (ref >60)
GLUCOSE: 80 mg/dL (ref 65–99)
POTASSIUM: 5.4 mmol/L — AB (ref 3.5–5.1)
Sodium: 136 mmol/L (ref 135–145)
TOTAL PROTEIN: 8 g/dL (ref 6.5–8.1)

## 2016-01-17 LAB — CBC WITH DIFFERENTIAL/PLATELET
BASOS ABS: 0 10*3/uL (ref 0.0–0.1)
BASOS PCT: 0 %
Eosinophils Absolute: 0 10*3/uL (ref 0.0–0.7)
Eosinophils Relative: 1 %
HEMATOCRIT: 32.6 % — AB (ref 36.0–46.0)
Hemoglobin: 10.4 g/dL — ABNORMAL LOW (ref 12.0–15.0)
LYMPHS PCT: 28 %
Lymphs Abs: 1.6 10*3/uL (ref 0.7–4.0)
MCH: 29.2 pg (ref 26.0–34.0)
MCHC: 31.9 g/dL (ref 30.0–36.0)
MCV: 91.6 fL (ref 78.0–100.0)
MONO ABS: 0.5 10*3/uL (ref 0.1–1.0)
Monocytes Relative: 8 %
NEUTROS ABS: 3.8 10*3/uL (ref 1.7–7.7)
NEUTROS PCT: 63 %
Platelets: 175 10*3/uL (ref 150–400)
RBC: 3.56 MIL/uL — AB (ref 3.87–5.11)
RDW: 15.5 % (ref 11.5–15.5)
WBC: 5.9 10*3/uL (ref 4.0–10.5)

## 2016-01-17 LAB — TROPONIN I: Troponin I: 0.13 ng/mL — ABNORMAL HIGH (ref <0.031)

## 2016-01-17 MED ORDER — SODIUM POLYSTYRENE SULFONATE 15 GM/60ML PO SUSP
30.0000 g | Freq: Once | ORAL | Status: AC
  Administered 2016-01-17: 30 g via ORAL
  Filled 2016-01-17: qty 120

## 2016-01-17 MED ORDER — ASPIRIN EC 81 MG PO TBEC
81.0000 mg | DELAYED_RELEASE_TABLET | Freq: Every day | ORAL | Status: DC
  Administered 2016-01-18: 81 mg via ORAL
  Filled 2016-01-17: qty 1

## 2016-01-17 MED ORDER — HYDRALAZINE HCL 20 MG/ML IJ SOLN: 10.0000 mg | INTRAMUSCULAR | Status: DC | PRN

## 2016-01-17 MED ORDER — LORAZEPAM 1 MG PO TABS
1.0000 mg | ORAL_TABLET | Freq: Every day | ORAL | Status: DC | PRN
  Administered 2016-01-17: 1 mg via ORAL
  Filled 2016-01-17: qty 1

## 2016-01-17 MED ORDER — SEVELAMER CARBONATE 800 MG PO TABS: 1600.0000 mg | ORAL_TABLET | Freq: Three times a day (TID) | ORAL | Status: DC

## 2016-01-17 MED ORDER — RENA-VITE PO TABS
1.0000 | ORAL_TABLET | Freq: Every day | ORAL | Status: DC
  Administered 2016-01-17: 1 via ORAL
  Filled 2016-01-17: qty 1

## 2016-01-17 MED ORDER — ONDANSETRON HCL 4 MG/2ML IJ SOLN: 4.0000 mg | Freq: Four times a day (QID) | INTRAMUSCULAR | Status: DC | PRN

## 2016-01-17 MED ORDER — ACETAMINOPHEN 650 MG RE SUPP: 650.0000 mg | Freq: Four times a day (QID) | RECTAL | Status: DC | PRN

## 2016-01-17 MED ORDER — CARVEDILOL 6.25 MG PO TABS
6.2500 mg | ORAL_TABLET | Freq: Two times a day (BID) | ORAL | Status: DC
  Administered 2016-01-18: 6.25 mg via ORAL
  Filled 2016-01-17: qty 1

## 2016-01-17 MED ORDER — ONDANSETRON HCL 4 MG PO TABS: 4.0000 mg | ORAL_TABLET | Freq: Four times a day (QID) | ORAL | Status: DC | PRN

## 2016-01-17 MED ORDER — SERTRALINE HCL 100 MG PO TABS
100.0000 mg | ORAL_TABLET | Freq: Every day | ORAL | Status: DC
  Administered 2016-01-18: 100 mg via ORAL
  Filled 2016-01-17: qty 1

## 2016-01-17 MED ORDER — CALCIUM CARBONATE 1250 MG/5ML PO SUSP: 500.0000 mg | Freq: Four times a day (QID) | ORAL | Status: DC | PRN

## 2016-01-17 MED ORDER — HEPARIN SODIUM (PORCINE) 5000 UNIT/ML IJ SOLN
5000.0000 [IU] | Freq: Three times a day (TID) | INTRAMUSCULAR | Status: DC
  Filled 2016-01-17 (×2): qty 1

## 2016-01-17 MED ORDER — AMLODIPINE BESYLATE 10 MG PO TABS: 10.0000 mg | ORAL_TABLET | Freq: Every day | ORAL | Status: DC

## 2016-01-17 MED ORDER — OXYCODONE HCL 5 MG PO TABS
5.0000 mg | ORAL_TABLET | ORAL | Status: DC | PRN
  Administered 2016-01-17 – 2016-01-18 (×3): 5 mg via ORAL
  Filled 2016-01-17 (×2): qty 1

## 2016-01-17 MED ORDER — MORPHINE SULFATE (PF) 2 MG/ML IV SOLN
1.0000 mg | INTRAVENOUS | Status: DC | PRN
  Filled 2016-01-17: qty 1

## 2016-01-17 MED ORDER — CINACALCET HCL 30 MG PO TABS
30.0000 mg | ORAL_TABLET | Freq: Every day | ORAL | Status: DC
  Administered 2016-01-18: 30 mg via ORAL
  Filled 2016-01-17: qty 1

## 2016-01-17 MED ORDER — SEVELAMER CARBONATE 800 MG PO TABS: 1600.0000 mg | ORAL_TABLET | ORAL | Status: DC | PRN

## 2016-01-17 MED ORDER — SEVELAMER CARBONATE 800 MG PO TABS
2400.0000 mg | ORAL_TABLET | Freq: Three times a day (TID) | ORAL | Status: DC
  Administered 2016-01-18: 2400 mg via ORAL
  Filled 2016-01-17: qty 3

## 2016-01-17 MED ORDER — ACETAMINOPHEN 325 MG PO TABS: 650.0000 mg | ORAL_TABLET | Freq: Four times a day (QID) | ORAL | Status: DC | PRN

## 2016-01-17 MED ORDER — ISOSORBIDE DINITRATE 10 MG PO TABS
10.0000 mg | ORAL_TABLET | Freq: Three times a day (TID) | ORAL | Status: DC
  Administered 2016-01-17 – 2016-01-18 (×2): 10 mg via ORAL
  Filled 2016-01-17 (×2): qty 1

## 2016-01-17 MED ORDER — HYDROMORPHONE HCL 1 MG/ML IJ SOLN
1.0000 mg | Freq: Once | INTRAMUSCULAR | Status: AC
  Administered 2016-01-17: 1 mg via INTRAVENOUS
  Filled 2016-01-17: qty 1

## 2016-01-17 MED ORDER — DIPHENHYDRAMINE HCL 50 MG/ML IJ SOLN
25.0000 mg | Freq: Once | INTRAMUSCULAR | Status: AC
  Administered 2016-01-17: 25 mg via INTRAVENOUS
  Filled 2016-01-17: qty 1

## 2016-01-17 NOTE — ED Notes (Signed)
Pt. Reports itching to RN Earlene Plater  RN reported to EDP

## 2016-01-17 NOTE — Progress Notes (Signed)
Admission note:  Arrival Method: Pt arrived on stretcher with [facility] Orientation: Alert and oriented x 4 Telemetry: Box 6e09 applied. CCMT notified Assessment: Completed Skin: Dry and intact. Skin assessed with Tempie Donning, RN IV: Right forearm IV. Site is clean, dry and intact. Pain: Pt states pain is 7/10 at this time. MD notified.  Tubes: Left forearm AV fistula. Positive for thrill and bruit.  Safety Measures: Bed in lowest position, non-slip socks placed, call light within reach Admission Screening: Completed  6700 Orientation: Patient has been oriented to the unit, staff and to the room. Orders have been reviewed and implemented. Call light is within reach, will continue to monitor the patient closely.   Feliciana Rossetti, RN, BSN

## 2016-01-17 NOTE — Consult Note (Signed)
[name] Small is an 25 y.o. female with sickle cell anemia and ESRD, prior failed renal transplant, HTN, CHF with ICD with no home dialysis unit.  She had HD at [facility] on 3/20 and 4000cc of fluid was removed.  She presented with malaise and volume excess and K 5.20mq/l.  CXR was negative.  She reports she is more mature now and will be a more compliant patient.  Phos was 9.4 on 3/20.  Past Medical History  Diagnosis Date  . [place]   . Hypertension   . Pulmonary emboli (HCC) 01/2012    Bilateral, moderate clot burden, areas of pulmonary infarction and central necrosis  . CHF (congestive heart failure) ([place]   . Cardiomyopathy   . Dysrhythmia     at times per pt.  . Anemia   . H/O transfusion of packed red blood cells   . End stage renal disease ([place]     s/p cadaveric renal transplant 07/2007 and transplant failure 08/2011, then transplant nephrectomy 08/2011  . Polycystic kidney disease   . Cellulitis and abscess of face 03/22/2013  . Renal insufficiency   . Chronic pain   . GERD (gastroesophageal reflux disease)   . Depression   . Hyperkalemia 09/2015  . Pelvic fracture ([place]   . Osteoporosis   . Sickle cell anemia (HCC)   . Narcotic abuse, continuous    Past Surgical History  Procedure Laterality Date  . Nephrectomy    . Av fistula placement    . Kidney transplant  2008    failed  . Tonsillectomy      as a child.  . Adenoidectomy    . Incision and drainage abscess Right 03/21/2013    Procedure: INCISION AND DRAINAGE RIGHT CHEEK ABSCESS REMOVAL OF FOREIGN BODY;  Surgeon: JIzora Gala MD;  [place] [facility]  Service: ENT;  Laterality: Right;  . Implantable cardioverter defibrillator implant Right 12/2014   Social History:  reports that she quit smoking about 5 weeks ago. Her smoking use included Cigarettes. She smoked 0.00 packs per day for 1 year. She has never used smokeless tobacco. She reports that she does not drink alcohol or use illicit  drugs. Allergies:  Allergies  Allergen Reactions  . Tramadol Anaphylaxis  . Vicodin [Hydrocodone-Acetaminophen] Hives  . Buprenorphine Hcl Itching    Ok with oxycodone  . Iohexol Itching  . Morphine And Related Itching    Ok with oxycodone   Family History  Problem Relation Age of Onset  . Polycystic kidney disease Father     Medications:  Prior to Admission:  Prescriptions prior to admission  Medication Sig Dispense Refill Last Dose  . amLODipine (NORVASC) 10 MG tablet Take 1 tablet (10 mg total) by mouth daily. 90 tablet 3 01/08/2016 at Unknown time  . calcium carbonate, dosed in mg elemental calcium, 1250 (500 Ca) MG/5ML Take 5 mLs (500 mg of elemental calcium total) by mouth every 6 (six) hours as needed for indigestion. 450 mL 30   . carvedilol (COREG) 6.25 MG tablet Take 1 tablet (6.25 mg total) by mouth 2 (two) times daily. 180 tablet 3 01/08/2016 at 0900  . cinacalcet (SENSIPAR) 30 MG tablet Take 1 tablet (30 mg total) by mouth daily with breakfast. 90 tablet 3 01/08/2016 at Unknown time  . isosorbide dinitrate (ISORDIL) 10 MG tablet Take 1 tablet (10 mg total) by mouth 3 (three) times daily. 90 tablet 3 01/08/2016 at 0900  . LORazepam (ATIVAN) 1 MG tablet Take 1 tablet (  1 mg total) by mouth daily as needed for anxiety. Patient states she takes one tablet by mouth every day per patient 30 tablet 0 01/08/2016 at Unknown time  . multivitamin (RENA-VIT) TABS tablet Take 1 tablet by mouth daily. 90 tablet 3 01/08/2016 at Unknown time  . oxyCODONE (ROXICODONE) 5 MG immediate release tablet Take 1 tablet (5 mg total) by mouth every 4 (four) hours as needed for severe pain. 90 tablet 0 01/08/2016 at Unknown time  . sertraline (ZOLOFT) 100 MG tablet Take 1 tablet (100 mg total) by mouth daily. 90 tablet 3 01/08/2016 at Unknown time  . sevelamer carbonate (RENVELA) 800 MG tablet Take 2-3 tablets (1,600-2,400 mg total) by mouth 3 (three) times daily with meals. 3 tabs with meals, 2 tabs with  snacks 120 tablet 3 01/08/2016 at Unknown time   ROS: as per HPI  Blood pressure 137/102, pulse 93, temperature 98.3 F (36.8 C), temperature source Oral, resp. rate 18, height '5\' 3"'  (1.6 m), weight 58.4 kg (128 lb 12 oz), last menstrual period 10/22/2011, SpO2 100 %.  General appearance: alert and cooperative Head: Normocephalic, without obvious abnormality, atraumatic Nose: Nares normal. Septum midline. Mucosa normal. No drainage or sinus tenderness. Throat: lips, mucosa, and tongue normal; teeth and gums normal Resp: clear to auscultation bilaterally Chest wall: no tenderness PM right chest Cardio: regular rate and rhythm, S1, S2 normal, no murmur, click, rub or gallop GI: soft, non-tender; bowel sounds normal; no masses,  no organomegaly Extremities: edema 1+, LUE AVF Skin: Skin color, texture, turgor normal. No rashes or lesions Neurologic: Grossly normal Results for orders placed or performed during the hospital encounter of 01/17/16 (from the past 48 hour(s))  CBC with Differential     Status: Abnormal   Collection Time: 01/17/16  6:30 PM  Result Value Ref Range   WBC 5.9 4.0 - 10.5 K/uL   RBC 3.56 (L) 3.87 - 5.11 MIL/uL   Hemoglobin 10.4 (L) 12.0 - 15.0 g/dL   HCT 32.6 (L) 36.0 - 46.0 %   MCV 91.6 78.0 - 100.0 fL   MCH 29.2 26.0 - 34.0 pg   MCHC 31.9 30.0 - 36.0 g/dL   RDW 15.5 11.5 - 15.5 %   Platelets 175 150 - 400 K/uL   Neutrophils Relative % 63 %   Neutro Abs 3.8 1.7 - 7.7 K/uL   Lymphocytes Relative 28 %   Lymphs Abs 1.6 0.7 - 4.0 K/uL   Monocytes Relative 8 %   Monocytes Absolute 0.5 0.1 - 1.0 K/uL   Eosinophils Relative 1 %   Eosinophils Absolute 0.0 0.0 - 0.7 K/uL   Basophils Relative 0 %   Basophils Absolute 0.0 0.0 - 0.1 K/uL  Comprehensive metabolic panel     Status: Abnormal   Collection Time: 01/17/16  6:30 PM  Result Value Ref Range   Sodium 136 135 - 145 mmol/L   Potassium 5.4 (H) 3.5 - 5.1 mmol/L   Chloride 97 (L) 101 - 111 mmol/L   CO2 24 22 - 32  mmol/L   Glucose, Bld 80 65 - 99 mg/dL   BUN 61 (H) 6 - 20 mg/dL   Creatinine, Ser 10.31 (H) 0.44 - 1.00 mg/dL   Calcium 9.4 8.9 - 10.3 mg/dL   Total Protein 8.0 6.5 - 8.1 g/dL   Albumin 3.6 3.5 - 5.0 g/dL   AST 30 15 - 41 U/L   ALT 15 14 - 54 U/L   Alkaline Phosphatase 425 (H) 38 - 126  U/L   Total Bilirubin 0.8 0.3 - 1.2 mg/dL   GFR calc non Af Amer 5 (L) >60 mL/min   GFR calc Af Amer 5 (L) >60 mL/min    Comment: (NOTE) The eGFR has been calculated using the CKD EPI equation. This calculation has not been validated in all clinical situations. eGFR's persistently <60 mL/min signify possible Chronic Kidney Disease.    Anion gap 15 5 - 15  Troponin I     Status: Abnormal   Collection Time: 01/17/16  6:30 PM  Result Value Ref Range   Troponin I 0.13 (H) <0.031 ng/mL    Comment:        PERSISTENTLY INCREASED TROPONIN VALUES IN THE RANGE OF 0.04-0.49 ng/mL CAN BE SEEN IN:       -UNSTABLE ANGINA       -CONGESTIVE HEART FAILURE       -MYOCARDITIS       -CHEST TRAUMA       -ARRYHTHMIAS       -LATE PRESENTING MYOCARDIAL INFARCTION       -COPD   CLINICAL FOLLOW-UP RECOMMENDED.    Dg Chest 2 View  01/17/2016  CLINICAL DATA:  Left-sided chest pain since yesterday. Shortness of breath. EXAM: CHEST  2 VIEW COMPARISON:  01/14/2016 FINDINGS: There is mild lingular scarring. There is no focal parenchymal opacity. There is no pleural effusion or pneumothorax. There is stable cardiomegaly. There is a single lead AICD. The osseous structures are unremarkable. IMPRESSION: No active cardiopulmonary disease. Electronically Signed   By: Kathreen Devoid   On: 01/17/2016 18:39    Assessment:  1 ESRD with nonadherence to treatment 2 Uremia due to lack of dialysis 3 Hyperkalemia 4 Hyperphopsphatemia Plan: 1HD in AM 2May be worth considering an OP spot in [place] again, after a psychology evaluation Inari Shin C 01/17/2016, 10:21 PM

## 2016-01-17 NOTE — ED Notes (Signed)
Pt. Placed on cardiac monitor for underlying rhythm ... Is SR at 96.  Sat is 100% on RA .  Pt. In no distress and no c/o chest pain at this time.

## 2016-01-17 NOTE — Progress Notes (Signed)
Accepted to tele bed under obs status. ESRD pt discharged from outpt HD center for non-compliance presents to [facility] ED with chest pain after 1 week without HD. Pain better with Dilaudid. EKG has TWIs in inferolateral leads (lateral TWIs have been consistent across prior EKGs; inferior TWI present intermittently). Troponin is 0.13; was 0.34 one week ago. Potassium is 5.4 and she was given Kayexalate.  Dr. Lowell Guitar consulted by EDP (Dr. Silverio Lay) and agrees to dialyze; please call him when she arrives.

## 2016-01-17 NOTE — ED Notes (Signed)
Dialysis pt. She has not had her dialysis in a week. She has an internal defibrillator. She started having chest pain yesterday.

## 2016-01-17 NOTE — H&P (Signed)
[facility] History and Physical  Lichelle Viets ONG:[id number] DOB: 06-06-1991 DOA: 01/17/2016  Referring physician: Patient was transferred from medicine [place]. PCP: Jeanann Lewandowsky, MD  Specialists: Nephrologist.  Chief Complaint: Chest pain.  HPI: [name] Small is a 25 y.o. female with history of ESRD on hemodialysis and has been not on regular dialysis as patient is still not set up with outpatient dialysis has had last dialysis on Tuesday week ago presents with complaint of chest pain. Patient has been having chest pain since yesterday morning. Chest pain is mostly in the left side of chest pleuritic in nature. Has no associated productive cough fever chills. Chest x-ray was unremarkable. Cardiac markers mildly elevated with EKG showing nonspecific T-wave changes. Patient has had previous history of PE status post IVC filter placement. Recent admission VQ scan was negative. Patient also has history of CHF with EF of 25% status post ICD placement. Patient has mild hyperkalemia for which He was given a nephrologist Dr. Lowell Guitar has been ordered a consult in and patient has been admitted for further management.   Review of Systems: As presented in the history of presenting illness, rest negative.  Past Medical History  Diagnosis Date  . Hemodialysis patient (HCC)   . Hypertension   . Pulmonary emboli (HCC) 01/2012    Bilateral, moderate clot burden, areas of pulmonary infarction and central necrosis  . CHF (congestive heart failure) (HCC)   . Cardiomyopathy   . Dysrhythmia     at times per pt.  . Anemia   . H/O transfusion of packed red blood cells   . 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 Polycystic kidney disease   . Cellulitis and abscess of face 03/22/2013  . Renal insufficiency   . Chronic pain   . GERD (gastroesophageal reflux disease)   . Depression   .  Hyperkalemia 09/2015  . Pelvic fracture (HCC)   . Osteoporosis   . Sickle cell anemia (HCC)   . Narcotic abuse, continuous    Past Surgical History  Procedure Laterality Date  . Nephrectomy    . Av fistula placement    . Kidney transplant  2008    failed  . Tonsillectomy      as a child.  . Adenoidectomy    . Incision and drainage abscess Right 03/21/2013    Procedure: INCISION AND DRAINAGE RIGHT CHEEK ABSCESS REMOVAL OF FOREIGN BODY;  Surgeon: Serena Colonel, MD;  Location: [facility] OR;  Service: ENT;  Laterality: Right;  . Implantable cardioverter defibrillator implant Right 12/2014   Social History:  reports that she quit smoking about 5 weeks ago. Her smoking use included Cigarettes. She smoked 0.00 packs per day for 1 year. She has never used smokeless tobacco. She reports that she does not drink alcohol or use illicit drugs. Where does patient live home. Can patient participate in ADLs? Yes.  Allergies  Allergen Reactions  . Tramadol Anaphylaxis  . Vicodin [Hydrocodone-Acetaminophen] Hives  . Buprenorphine Hcl Itching    Ok with oxycodone  . Iohexol Itching  . Morphine And Related Itching    Ok with oxycodone    Family History:  Family History  Problem Relation Age of Onset  . Polycystic kidney disease Father       Prior to Admission medications   Medication Sig Start Date End Date Taking? Authorizing Provider  amLODipine (NORVASC) 10 MG tablet Take 1 tablet (10  mg total) by mouth daily. 01/04/16   Quentin Angst, MD  calcium carbonate, dosed in mg elemental calcium, 1250 (500 Ca) MG/5ML Take 5 mLs (500 mg of elemental calcium total) by mouth every 6 (six) hours as needed for indigestion. 01/11/16   Rhetta Mura, MD  carvedilol (COREG) 6.25 MG tablet Take 1 tablet (6.25 mg total) by mouth 2 (two) times daily. 01/04/16   Quentin Angst, MD  cinacalcet (SENSIPAR) 30 MG tablet Take 1 tablet (30 mg total) by mouth daily with breakfast. 01/04/16   Quentin Angst,  MD  isosorbide dinitrate (ISORDIL) 10 MG tablet Take 1 tablet (10 mg total) by mouth 3 (three) times daily. 01/04/16   Olugbemiga Annitta Needs, MD  LORazepam (ATIVAN) 1 MG tablet Take 1 tablet (1 mg total) by mouth daily as needed for anxiety. Patient states she takes one tablet by mouth every day per patient 01/04/16   Quentin Angst, MD  multivitamin (RENA-VIT) TABS tablet Take 1 tablet by mouth daily. 01/04/16   Olugbemiga Annitta Needs, MD  oxyCODONE (ROXICODONE) 5 MG immediate release tablet Take 1 tablet (5 mg total) by mouth every 4 (four) hours as needed for severe pain. 01/04/16   Quentin Angst, MD  sertraline (ZOLOFT) 100 MG tablet Take 1 tablet (100 mg total) by mouth daily. 01/04/16   Quentin Angst, MD  sevelamer carbonate (RENVELA) 800 MG tablet Take 2-3 tablets (1,600-2,400 mg total) by mouth 3 (three) times daily with meals. 3 tabs with meals, 2 tabs with snacks 01/04/16 01/03/17  Quentin Angst, MD    Physical Exam: Filed Vitals:   01/17/16 1744 01/17/16 2000 01/17/16 2114  BP: 138/98 159/96 137/102  Pulse: 87 64 93  Temp: 98.4 F (36.9 C)  98.3 F (36.8 C)  TempSrc: Oral  Oral  Resp: Height:  (1.6 m)   (1.6 m)  Weight: 129 lb (58.514 kg)  128 lb 12 oz (58.4 kg)  SpO2: 100% 98% 100%     General:  Moderately built and poorly nourished.  Eyes: Anicteric no pallor.  ENT: No discharge from ears eyes nose and mouth.  Neck: No mass felt. No JVD appreciated.  Cardiovascular: S1 and S2 heard.  Respiratory: No rhonchi or crepitations.  Abdomen: Soft nontender bowel sounds present.  Skin: No rash.  Musculoskeletal: No edema.  Psychiatric: Appears normal.  Neurologic: Alert awake oriented to time place and person. Moves all extremities.  Labs on Admission:  Basic Metabolic Panel:  Recent Labs Lab 01/17/16 1830  NA 136  K 5.4*  CL 97*  CO2 24  GLUCOSE 80  BUN 61*  CREATININE 10.31*  CALCIUM 9.4   Liver Function  Tests:  Recent Labs Lab 01/17/16 1830  AST 30  ALT 15  ALKPHOS 425*  BILITOT 0.8  PROT 8.0  ALBUMIN 3.6   No results for input(s): LIPASE, AMYLASE in the last 168 hours. No results for input(s): AMMONIA in the last 168 hours. CBC:  Recent Labs Lab 01/17/16 1830  WBC 5.9  NEUTROABS 3.8  HGB 10.4*  HCT 32.6*  MCV 91.6  PLT 175   Cardiac Enzymes:  Recent Labs Lab 01/17/16 1830  TROPONINI 0.13*    BNP (last 3 results)  Recent Labs  06/29/15 1615 09/29/15 1242  BNP >4500.0* 2492.9*    ProBNP (last 3 results) No results for input(s): PROBNP in the last 8760 hours.  CBG: No results for input(s): GLUCAP in the last 168  hours.  Radiological Exams on Admission: Dg Chest 2 View  01/17/2016  CLINICAL DATA:  Left-sided chest pain since yesterday. Shortness of breath. EXAM: CHEST  2 VIEW COMPARISON:  01/14/2016 FINDINGS: There is mild lingular scarring. There is no focal parenchymal opacity. There is no pleural effusion or pneumothorax. There is stable cardiomegaly. There is a single lead AICD. The osseous structures are unremarkable. IMPRESSION: No active cardiopulmonary disease. Electronically Signed   By: Elige Ko   On: 01/17/2016 18:39    EKG: Independently reviewed. Normal sinus rhythm with nonspecific T changes.  Assessment/Plan Principal Problem:   Chest pain Active Problems:   PE (pulmonary embolism)   Systolic CHF, chronic (HCC)   Hyperkalemia   ESRD on dialysis (HCC)   1. Chest pain appears atypical - we'll cycle cardiac markers and d-dimer. D-dimer is positive will check VQ scan given history of PE. 2. ESRD on hemodialysis - appreciate nephrology consult. At this time nephrologist is planning to set up outpatient dialysis. Patient did receive Kayexalate. Follow metabolic panel. Posterior moderately limited. 3. Chronic systolic CHF last EF measured was 25% with ICD placement - fluid management per nephrology. 4. History of PE status post IVC filter  placement - check d-dimer since patient has pleuritic type of chest pain. 5. Chronic anemia - follow CBC. 6. Hypertension on Coreg and amlodipine. 7. History of sickle cell anemia per chart.   DVT Prophylaxis heparin.  Code Status: Full code.  Family Communication: Discussed with patient.  Disposition Plan: Admit for observation.    Vannak Montenegro N. [facility] Pager [phone].  If 7PM-7AM, please contact night-coverage [email or web address] Password [facility] 01/17/2016, 10:55 PM

## 2016-01-17 NOTE — ED Provider Notes (Signed)
CSN: [id number]     Arrival date & time 01/17/16  1740 History  By signing my name below, I, Iona Beard, attest that this documentation has been prepared under the direction and in the presence of Richardean Canal, MD.   Electronically Signed: Iona Beard, ED Scribe. 01/17/2016. 7:58 PM  Chief Complaint  Patient presents with  . Chest Pain    The history is provided by the patient. No language interpreter was used.   HPI Comments: [name] Small is a 25 y.o. female who presents to the Emergency Department complaining of gradual onset, constant chest pain, onset one day ago. She reports associated abdominal distension, shortness of breath, vomiting x2 episodes, nausea, and abdominal pain. Pt had her last dialysis on 01/11/2016. States that she cannot find a doctor for her dialysis due to hx of non-compliance. She was admitted to hospital and discharged on 01/08/2016 for ESRD on dialysis and hyperkalemia. No worsening or alleviating factors noted. Pt denies fever, chills, or any other pertinent symptoms.   Past Medical History  Diagnosis Date  . Hemodialysis patient (HCC)   . Hypertension   . Pulmonary emboli (HCC) 01/2012    Bilateral, moderate clot burden, areas of pulmonary infarction and central necrosis  . CHF (congestive heart failure) (HCC)   . Cardiomyopathy   . Dysrhythmia     at times per pt.  . Anemia   . H/O transfusion of packed red blood cells   . 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 Polycystic kidney disease   . Cellulitis and abscess of face 03/22/2013  . Renal insufficiency   . Chronic pain   . GERD (gastroesophageal reflux disease)   . Depression   . Hyperkalemia 09/2015  . Pelvic fracture (HCC)   . Osteoporosis   . Sickle cell anemia (HCC)   . Narcotic abuse, continuous    Past Surgical History  Procedure Laterality Date  . Nephrectomy    . Av  fistula placement    . Kidney transplant  2008    failed  . Tonsillectomy      as a child.  . Adenoidectomy    . Incision and drainage abscess Right 03/21/2013    Procedure: INCISION AND DRAINAGE RIGHT CHEEK ABSCESS REMOVAL OF FOREIGN BODY;  Surgeon: Serena Colonel, MD;  Location: [facility] OR;  Service: ENT;  Laterality: Right;  . Implantable cardioverter defibrillator implant Right 12/2014   Family History  Problem Relation Age of Onset  . Polycystic kidney disease Father    Social History  Substance Use Topics  . Smoking status: Former Smoker -- 0.00 packs/day for 1 years    Types: Cigarettes    Quit date: 12/08/2015  . Smokeless tobacco: Never Used  . Alcohol Use: No   OB History    No data available     Review of Systems  Respiratory: Positive for shortness of breath.   Cardiovascular: Positive for chest pain.  Gastrointestinal: Positive for vomiting, abdominal pain, diarrhea and abdominal distention.  All other systems reviewed and are negative.   Allergies  Tramadol; Vicodin; Buprenorphine hcl; Iohexol; and Morphine and related  Home Medications   Prior to Admission medications   Medication Sig Start Date End Date Taking? Authorizing Provider  amLODipine (NORVASC) 10 MG tablet Take 1 tablet (10 mg total) by mouth daily. 01/04/16   Quentin Angst, MD  calcium carbonate, dosed in mg elemental calcium,  1250 (500 Ca) MG/5ML Take 5 mLs (500 mg of elemental calcium total) by mouth every 6 (six) hours as needed for indigestion. 01/11/16   Rhetta Mura, MD  carvedilol (COREG) 6.25 MG tablet Take 1 tablet (6.25 mg total) by mouth 2 (two) times daily. 01/04/16   Quentin Angst, MD  cinacalcet (SENSIPAR) 30 MG tablet Take 1 tablet (30 mg total) by mouth daily with breakfast. 01/04/16   Quentin Angst, MD  isosorbide dinitrate (ISORDIL) 10 MG tablet Take 1 tablet (10 mg total) by mouth 3 (three) times daily. 01/04/16   Olugbemiga Annitta Needs, MD  LORazepam (ATIVAN) 1 MG  tablet Take 1 tablet (1 mg total) by mouth daily as needed for anxiety. Patient states she takes one tablet by mouth every day per patient 01/04/16   Quentin Angst, MD  multivitamin (RENA-VIT) TABS tablet Take 1 tablet by mouth daily. 01/04/16   Olugbemiga Annitta Needs, MD  oxyCODONE (ROXICODONE) 5 MG immediate release tablet Take 1 tablet (5 mg total) by mouth every 4 (four) hours as needed for severe pain. 01/04/16   Quentin Angst, MD  sertraline (ZOLOFT) 100 MG tablet Take 1 tablet (100 mg total) by mouth daily. 01/04/16   Quentin Angst, MD  sevelamer carbonate (RENVELA) 800 MG tablet Take 2-3 tablets (1,600-2,400 mg total) by mouth 3 (three) times daily with meals. 3 tabs with meals, 2 tabs with snacks 01/04/16 01/03/17  Olugbemiga E Jegede, MD   BP 138/98 mmHg  Pulse 87  Temp(Src) 98.4 F (36.9 C) (Oral)  Resp 18  Ht 5\' 3"  (1.6 m)  Wt 129 lb (58.514 kg)  BMI 22.86 kg/m2  SpO2 100%  LMP 10/22/2011 Physical Exam  Constitutional: She is oriented to person, place, and time. She appears well-developed and well-nourished. No distress.  HENT:  Head: Normocephalic and atraumatic.  Eyes: EOM are normal.  Neck: Normal range of motion.  Cardiovascular: Normal rate, regular rhythm and normal heart sounds.   Pulmonary/Chest: Effort normal. No respiratory distress. She has no wheezes.  Bilateral base crackles.   Abdominal: Soft. She exhibits no distension. There is no tenderness.  Musculoskeletal: Normal range of motion.  Left upper arm fistula. Trace bilateral pedal edema.   Neurological: She is alert and oriented to person, place, and time.  Skin: Skin is warm and dry.  Psychiatric: She has a normal mood and affect. Judgment normal.  Nursing note and vitals reviewed.   ED Course  Procedures (including critical care time) DIAGNOSTIC STUDIES: Oxygen Saturation is 100% on RA, normal by my interpretation.    COORDINATION OF CARE: 6:03 PM-Discussed treatment plan which includes  EKG, troponin, CBC with differential, CXR, and CMP with pt at bedside and pt agreed to plan.   CRITICAL CARE Performed by: Silverio Lay, Koral Thaden   Total critical care time: 30 minutes  Critical care time was exclusive of separately billable procedures and treating other patients.  Critical care was necessary to treat or prevent imminent or life-threatening deterioration.  Critical care was time spent personally by me on the following activities: development of treatment plan with patient and/or surrogate as well as nursing, discussions with consultants, evaluation of patient's response to treatment, examination of patient, obtaining history from patient or surrogate, ordering and performing treatments and interventions, ordering and review of laboratory studies, ordering and review of radiographic studies, pulse oximetry and re-evaluation of patient's condition.  Labs Review Labs Reviewed  CBC WITH DIFFERENTIAL/PLATELET - Abnormal; Notable for the following:    RBC  3.56 (*)    Hemoglobin 10.4 (*)    HCT 32.6 (*)    All other components within normal limits  COMPREHENSIVE METABOLIC PANEL - Abnormal; Notable for the following:    Potassium 5.4 (*)    Chloride 97 (*)    BUN 61 (*)    Creatinine, Ser 10.31 (*)    Alkaline Phosphatase 425 (*)    GFR calc non Af Amer 5 (*)    GFR calc Af Amer 5 (*)    All other components within normal limits  TROPONIN I - Abnormal; Notable for the following:    Troponin I 0.13 (*)    All other components within normal limits    Imaging Review Dg Chest 2 View  01/17/2016  CLINICAL DATA:  Left-sided chest pain since yesterday. Shortness of breath. EXAM: CHEST  2 VIEW COMPARISON:  01/14/2016 FINDINGS: There is mild lingular scarring. There is no focal parenchymal opacity. There is no pleural effusion or pneumothorax. There is stable cardiomegaly. There is a single lead AICD. The osseous structures are unremarkable. IMPRESSION: No active cardiopulmonary disease.  Electronically Signed   By: Elige Ko   On: 01/17/2016 18:39   I have personally reviewed and evaluated these images and lab results as part of my medical decision-making.   EKG Interpretation   Date/Time:  Monday January 17 2016 17:48:58 EDT Ventricular Rate:  91 PR Interval:  276 QRS Duration: 88 QT Interval:  366 QTC Calculation: 450 R Axis:   88 Text Interpretation:  Sinus rhythm with 1st degree A-V block ST \\T \ T wave  abnormality, consider inferolateral ischemia Abnormal ECG No significant  change since last tracing Confirmed by Earland Reish  MD, Saadia Dewitt ([id number]) on 01/17/2016  6:01:22 PM      MDM   Final diagnoses:  None   [name] Small is a 25 y.o. female hx of ESRD here with chest pain. Hasn't been dialyzed for a week and doesn't have a dialysis center to go to. Appears mildly fluid overloaded. Will get labs, trop, CXR.   7:59 PM Trop 0.13, similar to previous. Cr 10, baseline. K 5.4. No EKG changes. Given kayexelate for hyperkalemia. Discussed with Dr. Lowell Guitar, who is willing to dialyze her. Will admit for hyperkalemia, elevated trop (likely from renal failure).  I personally performed the services described in this documentation, which was scribed in my presence. The recorded information has been reviewed and is accurate.    Richardean Canal, MD 01/17/16 Rosamaria Lints

## 2016-01-18 DIAGNOSIS — I5022 Chronic systolic (congestive) heart failure: Secondary | ICD-10-CM | POA: Diagnosis not present

## 2016-01-18 DIAGNOSIS — R079 Chest pain, unspecified: Secondary | ICD-10-CM | POA: Diagnosis not present

## 2016-01-18 DIAGNOSIS — N186 End stage renal disease: Secondary | ICD-10-CM | POA: Diagnosis not present

## 2016-01-18 DIAGNOSIS — Z992 Dependence on renal dialysis: Secondary | ICD-10-CM | POA: Diagnosis not present

## 2016-01-18 LAB — BASIC METABOLIC PANEL
Anion gap: 16 — ABNORMAL HIGH (ref 5–15)
BUN: 66 mg/dL — AB (ref 6–20)
CALCIUM: 9.6 mg/dL (ref 8.9–10.3)
CHLORIDE: 102 mmol/L (ref 101–111)
CO2: 22 mmol/L (ref 22–32)
CREATININE: 11.64 mg/dL — AB (ref 0.44–1.00)
GFR calc Af Amer: 5 mL/min — ABNORMAL LOW (ref >60)
GFR calc non Af Amer: 4 mL/min — ABNORMAL LOW (ref >60)
GLUCOSE: 90 mg/dL (ref 65–99)
Potassium: 4.7 mmol/L (ref 3.5–5.1)
Sodium: 140 mmol/L (ref 135–145)

## 2016-01-18 LAB — CBC
HCT: 29.4 % — ABNORMAL LOW (ref 36.0–46.0)
HEMATOCRIT: 30.1 % — AB (ref 36.0–46.0)
Hemoglobin: 8.9 g/dL — ABNORMAL LOW (ref 12.0–15.0)
Hemoglobin: 9.6 g/dL — ABNORMAL LOW (ref 12.0–15.0)
MCH: 27.5 pg (ref 26.0–34.0)
MCH: 28.7 pg (ref 26.0–34.0)
MCHC: 30.3 g/dL (ref 30.0–36.0)
MCHC: 31.9 g/dL (ref 30.0–36.0)
MCV: 90.1 fL (ref 78.0–100.0)
MCV: 90.7 fL (ref 78.0–100.0)
PLATELETS: 167 10*3/uL (ref 150–400)
PLATELETS: 204 10*3/uL (ref 150–400)
RBC: 3.24 MIL/uL — ABNORMAL LOW (ref 3.87–5.11)
RBC: 3.34 MIL/uL — ABNORMAL LOW (ref 3.87–5.11)
RDW: 15.6 % — AB (ref 11.5–15.5)
RDW: 15.6 % — AB (ref 11.5–15.5)
WBC: 5.4 10*3/uL (ref 4.0–10.5)
WBC: 6.1 10*3/uL (ref 4.0–10.5)

## 2016-01-18 LAB — CREATININE, SERUM
CREATININE: 11.68 mg/dL — AB (ref 0.44–1.00)
GFR calc Af Amer: 5 mL/min — ABNORMAL LOW (ref >60)
GFR, EST NON AFRICAN AMERICAN: 4 mL/min — AB (ref >60)

## 2016-01-18 LAB — TROPONIN I
TROPONIN I: 0.15 ng/mL — AB (ref <0.031)
TROPONIN I: 0.16 ng/mL — AB (ref <0.031)
TROPONIN I: 0.16 ng/mL — AB (ref <0.031)

## 2016-01-18 LAB — MRSA PCR SCREENING: MRSA BY PCR: NEGATIVE

## 2016-01-18 LAB — D-DIMER, QUANTITATIVE: D-Dimer, Quant: 2.84 ug/mL-FEU — ABNORMAL HIGH (ref 0.00–0.50)

## 2016-01-18 MED ORDER — AMLODIPINE BESYLATE 10 MG PO TABS: 10.0000 mg | ORAL_TABLET | Freq: Every day | ORAL | Status: DC

## 2016-01-18 MED ORDER — LIDOCAINE-PRILOCAINE 2.5-2.5 % EX CREA: 1.0000 "application " | TOPICAL_CREAM | CUTANEOUS | Status: DC | PRN

## 2016-01-18 MED ORDER — SODIUM CHLORIDE 0.9 % IV SOLN: 100.0000 mL | INTRAVENOUS | Status: DC | PRN

## 2016-01-18 MED ORDER — HEPARIN SODIUM (PORCINE) 1000 UNIT/ML DIALYSIS: 1000.0000 [IU] | INTRAMUSCULAR | Status: DC | PRN

## 2016-01-18 MED ORDER — ALTEPLASE 2 MG IJ SOLR: 2.0000 mg | Freq: Once | INTRAMUSCULAR | Status: DC | PRN

## 2016-01-18 MED ORDER — OXYCODONE HCL 5 MG PO TABS
ORAL_TABLET | ORAL | Status: AC
  Filled 2016-01-18: qty 1

## 2016-01-18 MED ORDER — HEPARIN SODIUM (PORCINE) 1000 UNIT/ML DIALYSIS: 20.0000 [IU]/kg | INTRAMUSCULAR | Status: DC | PRN

## 2016-01-18 MED ORDER — HYDROMORPHONE HCL 1 MG/ML IJ SOLN
1.0000 mg | Freq: Once | INTRAMUSCULAR | Status: AC
  Administered 2016-01-18: 1 mg via INTRAVENOUS
  Filled 2016-01-18: qty 1

## 2016-01-18 MED ORDER — PENTAFLUOROPROP-TETRAFLUOROETH EX AERO: 1.0000 "application " | INHALATION_SPRAY | CUTANEOUS | Status: DC | PRN

## 2016-01-18 MED ORDER — LIDOCAINE HCL (PF) 1 % IJ SOLN: 5.0000 mL | INTRAMUSCULAR | Status: DC | PRN

## 2016-01-18 NOTE — Procedures (Signed)
I was present at this session.  I have reviewed the session itself and made appropriate changes.  HD via LUA AVF, needles too close, against protocol, recirc.  tol vol off.  [name] Small L 3/28/201710:26 AM

## 2016-01-18 NOTE — Progress Notes (Signed)
[name] Small to be D/C'd Home per MD order.  Discussed prescriptions and follow up appointments with the patient. Medication list explained in detail. Pt verbalized understanding.    Medication List    TAKE these medications        amLODipine 10 MG tablet  Commonly known as:  NORVASC  Take 1 tablet (10 mg total) by mouth daily.     calcium carbonate (dosed in mg elemental calcium) 1250 (500 Ca) MG/5ML  Take 5 mLs (500 mg of elemental calcium total) by mouth every 6 (six) hours as needed for indigestion.     carvedilol 6.25 MG tablet  Commonly known as:  COREG  Take 1 tablet (6.25 mg total) by mouth 2 (two) times daily.     cinacalcet 30 MG tablet  Commonly known as:  SENSIPAR  Take 1 tablet (30 mg total) by mouth daily with breakfast.     isosorbide dinitrate 10 MG tablet  Commonly known as:  ISORDIL  Take 1 tablet (10 mg total) by mouth 3 (three) times daily.     LORazepam 1 MG tablet  Commonly known as:  ATIVAN  Take 1 tablet (1 mg total) by mouth daily as needed for anxiety. Patient states she takes one tablet by mouth every day per patient     multivitamin Tabs tablet  Take 1 tablet by mouth daily.     oxyCODONE 5 MG immediate release tablet  Commonly known as:  ROXICODONE  Take 1 tablet (5 mg total) by mouth every 4 (four) hours as needed for severe pain.     sertraline 100 MG tablet  Commonly known as:  ZOLOFT  Take 1 tablet (100 mg total) by mouth daily.     sevelamer carbonate 800 MG tablet  Commonly known as:  RENVELA  Take 2-3 tablets (1,600-2,400 mg total) by mouth 3 (three) times daily with meals. 3 tabs with meals, 2 tabs with snacks        Filed Vitals:   01/18/16 1101 01/18/16 1157  BP: 120/67 137/84  Pulse: 101 100  Temp: 98 F (36.7 C) 98.3 F (36.8 C)  Resp: 17 18    Skin clean, dry and intact without evidence of skin break down, no evidence of skin tears noted. IV catheter discontinued intact. Site without signs and symptoms of  complications. Dressing and pressure applied. Pt denies pain at this time. No complaints noted.  An After Visit Summary was printed and given to the patient. Patient escorted via WC, and D/C home via private auto.  Trish Fountain 01/18/2016 4:36 PM

## 2016-01-18 NOTE — Telephone Encounter (Signed)
Patient verified DOB Patient is aware of needing a police report to verify the missing prescription. Patient aware of not being able to receive a refill until April 14th if police report is not provided. Patient expressed her understanding and had no further questions at this time

## 2016-01-18 NOTE — Discharge Summary (Signed)
PATIENT DETAILS Name: [name] Small Age: 25 y.o. Sex: female Date of Birth: [birth year] MRN: [id number]. Admitting Physician: Briscoe Deutscher, MD [id number], Keane Scrape, MD  Admit Date: 01/17/2016 Discharge date: 01/18/2016  Recommendations for Outpatient Follow-up:  1. Continue counseling regarding importance of adherence to her dialysis schedule. Per nephrology she has a dialysis center in [place] that she just does not go below.  PRIMARY DISCHARGE DIAGNOSIS:  Principal Problem:   Chest pain Active Problems:   PE (pulmonary embolism)   Systolic CHF, chronic (HCC)   Hyperkalemia   ESRD on dialysis [facility])      PAST MEDICAL HISTORY: Past Medical History  Diagnosis Date  . Hemodialysis patient (HCC)   . Hypertension   . Pulmonary emboli (HCC) 01/2012    Bilateral, moderate clot burden, areas of pulmonary infarction and central necrosis  . CHF (congestive heart failure) (HCC)   . Cardiomyopathy   . Dysrhythmia     at times per pt.  . Anemia   . H/O transfusion of packed red blood cells   . 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 Polycystic kidney disease   . Cellulitis and abscess of face 03/22/2013  . Renal insufficiency   . Chronic pain   . GERD (gastroesophageal reflux disease)   . Depression   . Hyperkalemia 09/2015  . Pelvic fracture (HCC)   . Osteoporosis   . Sickle cell anemia (HCC)   . Narcotic abuse, continuous     DISCHARGE MEDICATIONS: Current Discharge Medication List    CONTINUE these medications which have NOT CHANGED   Details  amLODipine (NORVASC) 10 MG tablet Take 1 tablet (10 mg total) by mouth daily. Qty: 90 tablet, Refills: 3   Associated Diagnoses: Chronic combined systolic and diastolic congestive heart failure (HCC)    calcium carbonate, dosed in mg elemental calcium, 1250 (500 Ca) MG/5ML Take 5 mLs (500 mg of elemental calcium  total) by mouth every 6 (six) hours as needed for indigestion. Qty: 450 mL, Refills: 30    carvedilol (COREG) 6.25 MG tablet Take 1 tablet (6.25 mg total) by mouth 2 (two) times daily. Qty: 180 tablet, Refills: 3   Associated Diagnoses: Chronic combined systolic and diastolic congestive heart failure (HCC)    cinacalcet (SENSIPAR) 30 MG tablet Take 1 tablet (30 mg total) by mouth daily with breakfast. Qty: 90 tablet, Refills: 3   Associated Diagnoses: ESRD on dialysis (HCC)    isosorbide dinitrate (ISORDIL) 10 MG tablet Take 1 tablet (10 mg total) by mouth 3 (three) times daily. Qty: 90 tablet, Refills: 3   Associated Diagnoses: Chronic combined systolic and diastolic congestive heart failure (HCC)    LORazepam (ATIVAN) 1 MG tablet Take 1 tablet (1 mg total) by mouth daily as needed for anxiety. Patient states she takes one tablet by mouth every day per patient Qty: 30 tablet, Refills: 0   Associated Diagnoses: ESRD on dialysis (HCC)    multivitamin (RENA-VIT) TABS tablet Take 1 tablet by mouth daily. Qty: 90 tablet, Refills: 3   Associated Diagnoses: Sickle cell anemia with pain (HCC)    oxyCODONE (ROXICODONE) 5 MG immediate release tablet Take 1 tablet (5 mg total) by mouth every 4 (four) hours as needed for severe pain. Qty: 90 tablet, Refills: 0   Associated Diagnoses: Sickle cell anemia with pain (HCC)    sertraline (ZOLOFT) 100 MG tablet Take 1 tablet (100 mg total) by  mouth daily. Qty: 90 tablet, Refills: 3   Associated Diagnoses: Depression    sevelamer carbonate (RENVELA) 800 MG tablet Take 2-3 tablets (1,600-2,400 mg total) by mouth 3 (three) times daily with meals. 3 tabs with meals, 2 tabs with snacks Qty: 120 tablet, Refills: 3   Associated Diagnoses: ESRD on dialysis (HCC)        ALLERGIES:   Allergies  Allergen Reactions  . Tramadol Anaphylaxis  . Vicodin [Hydrocodone-Acetaminophen] Hives  . Buprenorphine Hcl Itching    Ok with oxycodone  . Iohexol Itching   . Morphine And Related Itching    Ok with oxycodone    BRIEF HPI:  See H&P, Labs, Consult and Test reports for all details in brief, patient Is a 25 year old female with history of ESRD on hemodialysis-but nonadherent to her hemodialysis schedule-noncompliant for follow-up at her usual hemodialysis center-who has been him into the emergency room to get intermittent dialysis-the past several months admitted for chest pain and shortness of breath. Last dialysis was on 3/21.  CONSULTATIONS:   nephrology  PERTINENT RADIOLOGIC STUDIES: Dg Chest 2 View  01/17/2016  CLINICAL DATA:  Left-sided chest pain since yesterday. Shortness of breath. EXAM: CHEST  2 VIEW COMPARISON:  01/14/2016 FINDINGS: There is mild lingular scarring. There is no focal parenchymal opacity. There is no pleural effusion or pneumothorax. There is stable cardiomegaly. There is a single lead AICD. The osseous structures are unremarkable. IMPRESSION: No active cardiopulmonary disease. Electronically Signed   By: Elige Ko   On: 01/17/2016 18:39   Dg Chest 2 View  01/08/2016  CLINICAL DATA:  Left-sided chest pain.  Chronic renal failure EXAM: CHEST  2 VIEW COMPARISON:  January 02, 2016 and October 27, 2015 chest radiograph; chest CT October 15, 2015 FINDINGS: There is mild scarring in the left base. There is no edema or consolidation. There is moderate cardiac enlargement. Pulmonary vascularity is normal. Pacemaker lead is attached to the right ventricle. No adenopathy. No bone lesions. IMPRESSION: Persistent cardiomegaly. There may be a degree of superimposed pericardial effusion. There is scarring in the left base. No edema or consolidation. Pulmonary vascularity within normal limits. Electronically Signed   By: Bretta Bang III M.D.   On: 01/08/2016 15:11   Dg Chest 2 View  01/02/2016  CLINICAL DATA:  Chest pain, shortness of Breath EXAM: CHEST  2 VIEW COMPARISON:  12/26/2015 FINDINGS: Right AICD remains in place, unchanged.  Cardiomegaly. No confluent opacities or effusions. No edema. No acute bony abnormality. IMPRESSION: Cardiomegaly.  No active disease. Electronically Signed   By: Charlett Nose M.D.   On: 01/02/2016 14:29   Dg Chest 2 View  12/26/2015  CLINICAL DATA:  Tight squeezing, midsternal CP began this morning; feels worse upon a deep breath.CHF; Pt has had defibrillator for one year; HTN meds EXAM: CHEST - 2 VIEW COMPARISON:  12/21/2015 and previous FINDINGS: Stable position of right subclavian AICD. Stable cardiomegaly. Subsegmental atelectasis or scarring laterally in the lingula. Lungs otherwise clear. No pneumothorax. No effusion. Visualized skeletal structures are unremarkable. IVC filter at the L3 level. IMPRESSION: 1. Stable cardiomegaly and postop changes.  No acute disease. Electronically Signed   By: Corlis Leak M.D.   On: 12/26/2015 14:58   Dg Pelvis 1-2 Views  12/28/2015  CLINICAL DATA:  History of pelvic fracture. Pelvic pain. Hemodialysis. EXAM: PELVIS - 1-2 VIEW COMPARISON:  CT abdomen pelvis 10/26/2015 FINDINGS: Erosive changes of the SI joint bilaterally. No fusion of the SI joint. Fragmentation of the right pubis.  This may be due to surgery with bone grafting versus chronic fracture with heterotopic bone formation. Negative for acute fracture IMPRESSION: Chronic resorptive changes of the SI joints bilaterally. Chronic changes the right pubic bone stable from the prior CT. 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 Negative for acute fracture Electronically Signed   By: Marlan Palau M.D.   On: 12/28/2015 20:56   Nm Pulmonary Perf And Vent  01/03/2016  CLINICAL DATA:  25 year old female with chest pain for 3 days. Patient on dialysis and with sickle cell disease. EXAM: NUCLEAR MEDICINE VENTILATION - PERFUSION LUNG SCAN TECHNIQUE: Ventilation images were obtained in multiple projections using inhaled aerosol Tc-63m DTPA. Perfusion images were obtained in multiple projections after intravenous injection of  Tc-84m MAA. RADIOPHARMACEUTICALS:  29.0 millicuries Technetium-67m DTPA aerosol inhalation and 4.0 millicuries of Technetium-48m MAA IV COMPARISON:  04/14/2013 VQ study.  01/02/2016 chest radiograph FINDINGS: Ventilation: No focal ventilation defect. Perfusion: No wedge shaped peripheral perfusion defects to suggest acute pulmonary embolism. IMPRESSION: No evidence of acute pulmonary emboli.  No changes from 2014. Electronically Signed   By: Harmon Pier M.D.   On: 01/03/2016 17:02     PERTINENT LAB RESULTS: CBC:  Recent Labs  01/18/16 0008 01/18/16 0454  WBC 6.1 5.4  HGB 9.6* 8.9*  HCT 30.1* 29.4*  PLT 204 167   CMET CMP     Component Value Date/Time   NA 140 01/18/2016 0454   K 4.7 01/18/2016 0454   CL 102 01/18/2016 0454   CO2 22 01/18/2016 0454   GLUCOSE 90 01/18/2016 0454   BUN 66* 01/18/2016 0454   CREATININE 11.64* 01/18/2016 0454   CALCIUM 9.6 01/18/2016 0454   PROT 8.0 01/17/2016 1830   ALBUMIN 3.6 01/17/2016 1830   AST 30 01/17/2016 1830   ALT 15 01/17/2016 1830   ALKPHOS 425* 01/17/2016 1830   BILITOT 0.8 01/17/2016 1830   GFRNONAA 4* 01/18/2016 0454   GFRAA 5* 01/18/2016 0454    GFR Estimated Creatinine Clearance: 6.2 mL/min (by C-G formula based on Cr of 11.64). No results for input(s): LIPASE, AMYLASE in the last 72 hours.  Recent Labs  01/18/16 0008 01/18/16 0454 01/18/16 1206  TROPONINI 0.16* 0.15* 0.16*   Invalid input(s): POCBNP  Recent Labs  01/18/16 0008  DDIMER 2.84*   No results for input(s): HGBA1C in the last 72 hours. No results for input(s): CHOL, HDL, LDLCALC, TRIG, CHOLHDL, LDLDIRECT in the last 72 hours. No results for input(s): TSH, T4TOTAL, T3FREE, THYROIDAB in the last 72 hours.  Invalid input(s): FREET3 No results for input(s): VITAMINB12, FOLATE, FERRITIN, TIBC, IRON, RETICCTPCT in the last 72 hours. Coags: No results for input(s): INR in the last 72 hours.  Invalid input(s): PT Microbiology: Recent Results (from the  past 240 hour(s))  MRSA PCR Screening     Status: None   Collection Time: 01/17/16  9:44 PM  Result Value Ref Range Status   MRSA by PCR NEGATIVE NEGATIVE Final    Comment:        The GeneXpert MRSA Assay (FDA approved for NASAL specimens only), is one component of a comprehensive MRSA colonization surveillance program. It is not intended to diagnose MRSA infection nor to guide or monitor treatment for MRSA infections.      BRIEF HOSPITAL COURSE:  Principal Problem:   Chest pain:Chronic issue-deemed to be non-cardiac in etiology. Although troponins mildly elevated-she has a history of chronically elevated troponins in a setting of ESRD. Troponin trend is flat not suggestive of ACS. The low  suspicion for pulmonary embolism, reportedly has IVC filter in place he did furthermore, recent VQ scan on 3/13 was of low probability. Stable for discharge.  Active Problems: ESRD on hemodialysis: Nonadherent to dialysis schedule, noncompliant to follow-up at her usual dialysis center. She was seen by nephrology during this hospital stay and underwent dialysis. This M.D. spoke with Dr. Deterding-confirmed that patient actually has a dialysis center and [place] that she just does not go to. She was counseled extensively regarding life threatening life disabling effects of noncompliance to hemodialysis. However I suspect, she will continue to come to the emergency room periodically for dialysis.   History of pulmonary embolism: IVC filter in place, recent VQ scan on 3/13 of low probability. Suspect Not anticoagulation candidate-given noncompliance.  History of chronic systolic heart failure: Volume removal during dialysis. He has AICD in place. Continue usual medications.  Claims to have sickle cell disease-however recent hemoglobin electrophoresis did not show any evidence of sickle cell disease.  TODAY-DAY OF DISCHARGE:  Subjective:   [name] Small today has Shortness of breath. She feels  better than on admission  Objective:   Blood pressure 137/84, pulse 100, temperature 98.3 F (36.8 C), temperature source Oral, resp. rate 18, height  (1.6 m), weight 54.1 kg (119 lb 4.3 oz), last menstrual period 10/22/2011, SpO2 100 %.  Intake/Output Summary (Last 24 hours) at 01/18/16 1504 Last data filed at 01/18/16 1426  Gross per 24 hour  Intake    200 ml  Output   4000 ml  Net  -3800 ml   Filed Weights   01/17/16 2114 01/18/16 0648 01/18/16 1101  Weight: 58.4 kg (128 lb 12 oz) 58.7 kg (129 lb 6.6 oz) 54.1 kg (119 lb 4.3 oz)    Exam Awake Alert, Oriented *3, No new F.N deficits, Normal affect [place].AT,PERRAL Supple Neck,No JVD, No cervical lymphadenopathy appriciated.  Symmetrical Chest wall movement, Good air movement bilaterally, CTAB RRR,No Gallops,Rubs or new Murmurs, No Parasternal Heave +ve B.Sounds, Abd Soft, Non tender, No organomegaly appriciated, No rebound -guarding or rigidity. No Cyanosis, Clubbing or edema, No new Rash or bruise  DISCHARGE CONDITION: Stable  DISPOSITION: Home  DISCHARGE INSTRUCTIONS:    Activity:  As tolerated   Get Medicines reviewed and adjusted: Please take all your medications with you for your next visit with your Primary MD  Please request your Primary MD to go over all hospital tests and procedure/radiological results at the follow up, please ask your Primary MD to get all Hospital records sent to his/her office.  If you experience worsening of your admission symptoms, develop shortness of breath, life threatening emergency, suicidal or homicidal thoughts you must seek medical attention immediately by calling 911 or calling your MD immediately  if symptoms less severe.  You must read complete instructions/literature along with all the possible adverse reactions/side effects for all the Medicines you take and that have been prescribed to you. Take any new Medicines after you have completely understood and accpet all the  possible adverse reactions/side effects.   Do not drive when taking Pain medications.   Do not take more than prescribed Pain, Sleep and Anxiety Medications  Special Instructions: If you have smoked or chewed Tobacco  in the last 2 yrs please stop smoking, stop any regular Alcohol  and or any Recreational drug use.  Wear Seat belts while driving.  Please note  You were cared for by a hospitalist during your hospital stay. Once you are discharged, your primary care physician will  handle any further medical issues. Please note that NO REFILLS for any discharge medications will be authorized once you are discharged, as it is imperative that you return to your primary care physician (or establish a relationship with a primary care physician if you do not have one) for your aftercare needs so that they can reassess your need for medications and monitor your lab values.   Diet recommendation: Heart Healthy diet  Discharge Instructions    Call MD for:  difficulty breathing, headache or visual disturbances    Complete by:  As directed      Diet - low sodium heart healthy    Complete by:  As directed      Increase activity slowly    Complete by:  As directed            Follow-up Information    Please follow up.   Contact information:   HD center in [place] at your usual schedule     Total Time spent on discharge equals 25  minutes.  SignedJeoffrey Massed 01/18/2016 3:04 PM

## 2016-01-18 NOTE — Progress Notes (Signed)
Subjective: Interval History: has no complaint , has not been to her center in [place] in 1/1/2 mon..  Objective: Vital signs in last 24 hours: Temp:  [98.1 F (36.7 C)-98.4 F (36.9 C)] 98.3 F (36.8 C) (03/28 0648) Pulse Rate:  [64-107] 96 (03/28 1000) Resp:  [11-22] 11 (03/28 1000) BP: (113-159)/(66-102) 134/87 mmHg (03/28 1000) SpO2:  [98 %-100 %] 100 % (03/28 0648) Weight:  [58.4 kg (128 lb 12 oz)-58.7 kg (129 lb 6.6 oz)] 58.7 kg (129 lb 6.6 oz) (03/28 0648) Weight change:   Intake/Output from previous day:   Intake/Output this shift:    General appearance: alert, cooperative and no distress Resp: diminished breath sounds bibasilar and rales bibasilar Cardio: S1, S2 normal and systolic murmur: holosystolic 2/6, blowing at apex GI: soft, pos bs, liver down 5 cm Extremities: edema 1+ and LUA AVF  Lab Results:  Recent Labs  01/18/16 0008 01/18/16 0454  WBC 6.1 5.4  HGB 9.6* 8.9*  HCT 30.1* 29.4*  PLT 204 167   BMET:  Recent Labs  01/17/16 1830 01/18/16 0008 01/18/16 0454  NA 136  --  140  K 5.4*  --  4.7  CL 97*  --  102  CO2 24  --  22  GLUCOSE 80  --  90  BUN 61*  --  66*  CREATININE 10.31* 11.68* 11.64*  CALCIUM 9.4  --  9.6   No results for input(s): PTH in the last 72 hours. Iron Studies: No results for input(s): IRON, TIBC, TRANSFERRIN, FERRITIN in the last 72 hours.  Studies/Results: Dg Chest 2 View  01/17/2016  CLINICAL DATA:  Left-sided chest pain since yesterday. Shortness of breath. EXAM: CHEST  2 VIEW COMPARISON:  01/14/2016 FINDINGS: There is mild lingular scarring. There is no focal parenchymal opacity. There is no pleural effusion or pneumothorax. There is stable cardiomegaly. There is a single lead AICD. The osseous structures are unremarkable. IMPRESSION: No active cardiopulmonary disease. Electronically Signed   By: Elige Ko   On: 01/17/2016 18:39    I have reviewed the patient's current medications.  Assessment/Plan: 1 ESRD vol, ^  K. nonadherence 2 HTN vol and meds 3 NONADHERENCE primary issue 4 Anemia P HD, counsel.      Mattisen Pohlmann L 01/18/2016,10:27 AM

## 2016-01-20 ENCOUNTER — Telehealth: Payer: Self-pay | Admitting: *Deleted

## 2016-01-20 NOTE — Telephone Encounter (Signed)
MA Is unable to LVM due to number ringing and then hanging up. MA attempted to reach patient on contact twice. Communication letter will be sent.

## 2016-01-20 NOTE — Telephone Encounter (Signed)
-----   Message from Quentin Angst, MD sent at 01/14/2016 10:14 AM EDT ----- Please inform patient that she does not have sickle cell disease. Her hemoglobinopathy evaluation showed normal adult hemoglobin.

## 2016-01-23 ENCOUNTER — Emergency Department (HOSPITAL_BASED_OUTPATIENT_CLINIC_OR_DEPARTMENT_OTHER): Payer: Medicare Other

## 2016-01-23 ENCOUNTER — Encounter (HOSPITAL_BASED_OUTPATIENT_CLINIC_OR_DEPARTMENT_OTHER): Payer: Self-pay | Admitting: Emergency Medicine

## 2016-01-23 ENCOUNTER — Inpatient Hospital Stay (HOSPITAL_BASED_OUTPATIENT_CLINIC_OR_DEPARTMENT_OTHER)
Admission: EM | Admit: 2016-01-23 | Discharge: 2016-01-24 | DRG: 683 | Disposition: A | Discharge status: Home / Self Care | Payer: Medicare Other | Attending: Emergency Medicine | Admitting: Emergency Medicine

## 2016-01-23 DIAGNOSIS — Z9115 Patient's noncompliance with renal dialysis: Secondary | ICD-10-CM

## 2016-01-23 DIAGNOSIS — R079 Chest pain, unspecified: Secondary | ICD-10-CM

## 2016-01-23 DIAGNOSIS — N186 End stage renal disease: Secondary | ICD-10-CM | POA: Diagnosis not present

## 2016-01-23 DIAGNOSIS — E875 Hyperkalemia: Secondary | ICD-10-CM | POA: Diagnosis present

## 2016-01-23 DIAGNOSIS — D571 Sickle-cell disease without crisis: Secondary | ICD-10-CM | POA: Diagnosis present

## 2016-01-23 DIAGNOSIS — N2581 Secondary hyperparathyroidism of renal origin: Secondary | ICD-10-CM | POA: Diagnosis present

## 2016-01-23 LAB — CBC WITH DIFFERENTIAL/PLATELET
BASOS PCT: 0 %
Basophils Absolute: 0 10*3/uL (ref 0.0–0.1)
EOS ABS: 0 10*3/uL (ref 0.0–0.7)
EOS PCT: 0 %
HCT: 29.1 % — ABNORMAL LOW (ref 36.0–46.0)
HEMOGLOBIN: 9.3 g/dL — AB (ref 12.0–15.0)
Lymphocytes Relative: 26 %
Lymphs Abs: 1.8 10*3/uL (ref 0.7–4.0)
MCH: 28.4 pg (ref 26.0–34.0)
MCHC: 32 g/dL (ref 30.0–36.0)
MCV: 89 fL (ref 78.0–100.0)
MONO ABS: 0.6 10*3/uL (ref 0.1–1.0)
Monocytes Relative: 8 %
NEUTROS ABS: 4.5 10*3/uL (ref 1.7–7.7)
Neutrophils Relative %: 66 %
Platelets: 242 10*3/uL (ref 150–400)
RBC: 3.27 MIL/uL — ABNORMAL LOW (ref 3.87–5.11)
RDW: 15.3 % (ref 11.5–15.5)
WBC: 6.9 10*3/uL (ref 4.0–10.5)

## 2016-01-23 LAB — BASIC METABOLIC PANEL
Anion gap: 20 — ABNORMAL HIGH (ref 5–15)
BUN: 112 mg/dL — AB (ref 6–20)
CHLORIDE: 98 mmol/L — AB (ref 101–111)
CO2: 19 mmol/L — ABNORMAL LOW (ref 22–32)
CREATININE: 15.07 mg/dL — AB (ref 0.44–1.00)
Calcium: 10.1 mg/dL (ref 8.9–10.3)
GFR calc Af Amer: 3 mL/min — ABNORMAL LOW (ref >60)
GFR calc non Af Amer: 3 mL/min — ABNORMAL LOW (ref >60)
GLUCOSE: 84 mg/dL (ref 65–99)
Potassium: 6.5 mmol/L (ref 3.5–5.1)
SODIUM: 137 mmol/L (ref 135–145)

## 2016-01-23 LAB — TROPONIN I: TROPONIN I: 0.2 ng/mL — AB (ref <0.031)

## 2016-01-23 MED ORDER — DIPHENHYDRAMINE HCL 25 MG PO CAPS
50.0000 mg | ORAL_CAPSULE | Freq: Once | ORAL | Status: AC
  Administered 2016-01-23: 50 mg via ORAL
  Filled 2016-01-23: qty 2

## 2016-01-23 MED ORDER — HYDROMORPHONE HCL 1 MG/ML IJ SOLN
1.0000 mg | Freq: Once | INTRAMUSCULAR | Status: AC
  Administered 2016-01-23: 1 mg via INTRAVENOUS
  Filled 2016-01-23: qty 1

## 2016-01-23 NOTE — ED Notes (Signed)
Pt alert, NAD, calm, no dyspnea noted, resps e/u, to xray via stretcher.

## 2016-01-23 NOTE — ED Provider Notes (Signed)
CSN: [id number]     Arrival date & time 01/23/16  2132 History  By signing my name below, I, Rohini Rajnarayanan, attest that this documentation has been prepared under the direction and in the presence of Doug Sou, MD Electronically Signed: Charlean Merl, ED Scribe 01/23/2016 at 10:13 PM.   Chief Complaint  Patient presents with  . Chest Pain   The history is provided by the patient. No language interpreter was used.    HPI Comments: [name] Small is a 25 y.o. female with a pmhx of sickle cell anemia and ESRD, who presents to the Emergency Department complaining of sharp, constant, increasing, central-left sided, non-radiating, 8/10 CP that began this morning around 8AM. Pain is exacerbated by taking a deep breath and laying on her back. Pt also has facial swelling since this morning. Pt's last dialysis was6 days ago because she does not have a dialysis center in [place]. Pt is seen by a doctor in [place]. Pt currently lives in [place], [place]. Pt was very non-compliant when she was 18, and so many centers in the area will not accept her. Pt denies any use of alcohol or illegal drugs. Pt used to smoke 2 months ago.  She had similar symptoms one week ago. She received dialysis and all symptoms resolved. She had ventilation perfusion scan arch 13 2017 which was negative for pulmonary embolism.   Past Medical History  Diagnosis Date  . Hemodialysis patient (HCC)   . Hypertension   . Pulmonary emboli (HCC) 01/2012    Bilateral, moderate clot burden, areas of pulmonary infarction and central necrosis  . CHF (congestive heart failure) (HCC)   . Cardiomyopathy   . Dysrhythmia     at times per pt.  . Anemia   . H/O transfusion of packed red blood cells   . 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 Polycystic kidney disease   . Cellulitis and abscess of face 03/22/2013  . Renal  insufficiency   . Chronic pain   . GERD (gastroesophageal reflux disease)   . Depression   . Hyperkalemia 09/2015  . Pelvic fracture (HCC)   . Osteoporosis   . Sickle cell anemia (HCC)   . Narcotic abuse, continuous    Past Surgical History  Procedure Laterality Date  . Nephrectomy    . Av fistula placement    . Kidney transplant  2008    failed  . Tonsillectomy      as a child.  . Adenoidectomy    . Incision and drainage abscess Right 03/21/2013    Procedure: INCISION AND DRAINAGE RIGHT CHEEK ABSCESS REMOVAL OF FOREIGN BODY;  Surgeon: Serena Colonel, MD;  Location[facility] OR;  Service: ENT;  Laterality: Right;  . Implantable cardioverter defibrillator implant Right 12/2014   Family History  Problem Relation Age of Onset  . Polycystic kidney disease Father    Social History  Substance Use Topics  . Smoking status: Former Smoker -- 0.00 packs/day for 1 years    Types: Cigarettes    Quit date: 12/08/2015  . Smokeless tobacco: Never Used  . Alcohol Use: No   OB History    No data available     Review of Systems  Constitutional: Negative.   HENT: Positive for facial swelling.   Respiratory: Positive for shortness of breath.   Cardiovascular: Positive for chest pain.  Gastrointestinal: Negative.   Musculoskeletal: Positive for arthralgias.  Pelvic pain from recent pelvic fracture  Skin: Negative.   Allergic/Immunologic: Positive for immunocompromised state.  Neurological: Negative.   Psychiatric/Behavioral: Negative.   All other systems reviewed and are negative.   Allergies  Tramadol; Vicodin; Buprenorphine hcl; Iohexol; and Morphine and related  Home Medications   Prior to Admission medications   Medication Sig Start Date End Date Taking? Authorizing Provider  amLODipine (NORVASC) 10 MG tablet Take 1 tablet (10 mg total) by mouth daily. 01/04/16  Yes Quentin Angst, MD  calcium carbonate, dosed in mg elemental calcium, 1250 (500 Ca) MG/5ML Take 5 mLs (500  mg of elemental calcium total) by mouth every 6 (six) hours as needed for indigestion. 01/11/16  Yes Rhetta Mura, MD  carvedilol (COREG) 6.25 MG tablet Take 1 tablet (6.25 mg total) by mouth 2 (two) times daily. 01/04/16  Yes Quentin Angst, MD  cinacalcet (SENSIPAR) 30 MG tablet Take 1 tablet (30 mg total) by mouth daily with breakfast. 01/04/16  Yes Olugbemiga Annitta Needs, MD  isosorbide dinitrate (ISORDIL) 10 MG tablet Take 1 tablet (10 mg total) by mouth 3 (three) times daily. 01/04/16  Yes Quentin Angst, MD  multivitamin (RENA-VIT) TABS tablet Take 1 tablet by mouth daily. 01/04/16  Yes Olugbemiga Annitta Needs, MD  oxyCODONE (ROXICODONE) 5 MG immediate release tablet Take 1 tablet (5 mg total) by mouth every 4 (four) hours as needed for severe pain. 01/04/16  Yes Quentin Angst, MD  sertraline (ZOLOFT) 100 MG tablet Take 1 tablet (100 mg total) by mouth daily. 01/04/16  Yes Quentin Angst, MD  sevelamer carbonate (RENVELA) 800 MG tablet Take 2-3 tablets (1,600-2,400 mg total) by mouth 3 (three) times daily with meals. 3 tabs with meals, 2 tabs with snacks 01/04/16 01/03/17 Yes Olugbemiga E Jegede, MD  LORazepam (ATIVAN) 1 MG tablet Take 1 tablet (1 mg total) by mouth daily as needed for anxiety. Patient states she takes one tablet by mouth every day per patient 01/04/16   Quentin Angst, MD   BP 154/123 mmHg  Pulse 92  Temp(Src) 98.3 F (36.8 C) (Oral)  Resp 16  Ht 5\' 3"  (1.6 m)  Wt 114 lb 10.2 oz (52 kg)  BMI 20.31 kg/m2  SpO2 100%  LMP 10/22/2011 Physical Exam  Constitutional: She appears well-developed and well-nourished.  Chronically ill-appearing  HENT:  Head: Normocephalic and atraumatic.  Periorbital swelling bilaterally  Eyes: Conjunctivae are normal. Pupils are equal, round, and reactive to light.  Neck: Neck supple. No JVD present. No tracheal deviation present. No thyromegaly present.  Cardiovascular: Normal rate and regular rhythm.   No murmur  heard. Pulmonary/Chest: Effort normal and breath sounds normal.  Abdominal: Soft. Bowel sounds are normal. She exhibits no distension. There is no tenderness.  Musculoskeletal: Normal range of motion. She exhibits edema. She exhibits no tenderness.  Trace pretibial pitting edema bilaterally at left upper extremity with dialysis fistula with good thrill  Neurological: She is alert. Coordination normal.  Skin: Skin is warm and dry. No rash noted.  Psychiatric: She has a normal mood and affect.  Nursing note and vitals reviewed.   ED Course  Procedures  DIAGNOSTIC STUDIES: Oxygen Saturation is 100% on RA, normal by my interpretation.    COORDINATION OF CARE: 10:03 PM-Discussed treatment plan which includes blood work, troponin, EKG, and transfer to [facility] for dialysis with pt at bedside and pt agreed to plan.   Labs Review Labs Reviewed  CBC WITH DIFFERENTIAL/PLATELET  BASIC METABOLIC PANEL  TROPONIN  I    Imaging Review No results found. I have personally reviewed and evaluated these images and lab results as part of my medical decision-making.   EKG Interpretation   Date/Time:  Sunday January 23 2016 21:43:29 EDT Ventricular Rate:  91 PR Interval:  266 QRS Duration: 96 QT Interval:  378 QTC Calculation: 464 R Axis:   49 Text Interpretation:  Sinus rhythm with 1st degree A-V block ST \\T\\ T wave  abnormality, consider lateral ischemia Prolonged QT Abnormal ECG No  significant change since last tracing Confirmed by   MD,   ([id number]) on 01/23/2016 9:52:01 PM     12 :20 AM chest pain is improved after treatment with intravenous hydromorphone. She also received oral Benadryl for itching after treatment with hydromorphone. She is in no respiratory distress. Chest x-ray viewed by me Results for orders placed or performed during the hospital encounter of 01/23/16  CBC with Differential/Platelet  Result Value Ref Range   WBC 6.9 4.0 - 10.5 K/uL   RBC 3.27 (L) 3.87 - 5.11  MIL/uL   Hemoglobin 9.3 (L) 12.0 - 15.0 g/dL   HCT 16.1 (L) 09.6 - 04.5 %   MCV 89.0 78.0 - 100.0 fL   MCH 28.4 26.0 - 34.0 pg   MCHC 32.0 30.0 - 36.0 g/dL   RDW 40.9 81.1 - 91.4 %   Platelets 242 150 - 400 K/uL   Neutrophils Relative % 66 %   Neutro Abs 4.5 1.7 - 7.7 K/uL   Lymphocytes Relative 26 %   Lymphs Abs 1.8 0.7 - 4.0 K/uL   Monocytes Relative 8 %   Monocytes Absolute 0.6 0.1 - 1.0 K/uL   Eosinophils Relative 0 %   Eosinophils Absolute 0.0 0.0 - 0.7 K/uL   Basophils Relative 0 %   Basophils Absolute 0.0 0.0 - 0.1 K/uL  Basic metabolic panel  Result Value Ref Range   Sodium 137 135 - 145 mmol/L   Potassium 6.5 (HH) 3.5 - 5.1 mmol/L   Chloride 98 (L) 101 - 111 mmol/L   CO2 19 (L) 22 - 32 mmol/L   Glucose, Bld 84 65 - 99 mg/dL   BUN 782 (H) 6 - 20 mg/dL   Creatinine, Ser 95.62 (H) 0.44 - 1.00 mg/dL   Calcium 13.0 8.9 - 86.5 mg/dL   GFR calc non Af Amer 3 (L) >60 mL/min   GFR calc Af Amer 3 (L) >60 mL/min   Anion gap 20 (H) 5 - 15  Troponin I  Result Value Ref Range   Troponin I 0.20 (H) <0.031 ng/mL   Dg Chest 2 View  01/23/2016  CLINICAL DATA:  Left-sided chest pain one day EXAM: CHEST  2 VIEW COMPARISON:  01/17/2016 FINDINGS: Cardiomegaly is again identified. Defibrillator is again seen and stable. The lungs are well aerated bilaterally. No focal infiltrate or sizable effusion is seen. No bony abnormality is noted. IMPRESSION: No active cardiopulmonary disease. Electronically Signed   By: Alcide Clever M.D.   On: 01/23/2016 22:44   Dg Chest 2 View  01/17/2016  CLINICAL DATA:  Left-sided chest pain since yesterday. Shortness of breath. EXAM: CHEST  2 VIEW COMPARISON:  01/14/2016 FINDINGS: There is mild lingular scarring. There is no focal parenchymal opacity. There is no pleural effusion or pneumothorax. There is stable cardiomegaly. There is a single lead AICD. The osseous structures are unremarkable. IMPRESSION: No active cardiopulmonary disease. Electronically Signed    By: Elige Ko   On: 01/17/2016 18:39   Dg Chest  2 View  01/08/2016  CLINICAL DATA:  Left-sided chest pain.  Chronic renal failure EXAM: CHEST  2 VIEW COMPARISON:  January 02, 2016 and October 27, 2015 chest radiograph; chest CT October 15, 2015 FINDINGS: There is mild scarring in the left base. There is no edema or consolidation. There is moderate cardiac enlargement. Pulmonary vascularity is normal. Pacemaker lead is attached to the right ventricle. No adenopathy. No bone lesions. IMPRESSION: Persistent cardiomegaly. There may be a degree of superimposed pericardial effusion. There is scarring in the left base. No edema or consolidation. Pulmonary vascularity within normal limits. Electronically Signed   By: Bretta Bang III M.D.   On: 01/08/2016 15:11   Dg Chest 2 View  01/02/2016  CLINICAL DATA:  Chest pain, shortness of Breath EXAM: CHEST  2 VIEW COMPARISON:  12/26/2015 FINDINGS: Right AICD remains in place, unchanged. Cardiomegaly. No confluent opacities or effusions. No edema. No acute bony abnormality. IMPRESSION: Cardiomegaly.  No active disease. Electronically Signed   By: Charlett Nose M.D.   On: 01/02/2016 14:29   Dg Chest 2 View  12/26/2015  CLINICAL DATA:  Tight squeezing, midsternal CP began this morning; feels worse upon a deep breath.CHF; Pt has had defibrillator for one year; HTN meds EXAM: CHEST - 2 VIEW COMPARISON:  12/21/2015 and previous FINDINGS: Stable position of right subclavian AICD. Stable cardiomegaly. Subsegmental atelectasis or scarring laterally in the lingula. Lungs otherwise clear. No pneumothorax. No effusion. Visualized skeletal structures are unremarkable. IVC filter at the L3 level. IMPRESSION: 1. Stable cardiomegaly and postop changes.  No acute disease. Electronically Signed   By: Corlis Leak M.D.   On: 12/26/2015 14:58   Dg Pelvis 1-2 Views  12/28/2015  CLINICAL DATA:  History of pelvic fracture. Pelvic pain. Hemodialysis. EXAM: PELVIS - 1-2 VIEW COMPARISON:  CT  abdomen pelvis 10/26/2015 FINDINGS: Erosive changes of the SI joint bilaterally. No fusion of the SI joint. Fragmentation of the right pubis. This may be due to surgery with bone grafting versus chronic fracture with heterotopic bone formation. Negative for acute fracture IMPRESSION: Chronic resorptive changes of the SI joints bilaterally. Chronic changes the right pubic bone stable from the prior CT. 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 Negative for acute fracture Electronically Signed   By: Marlan Palau M.D.   On: 12/28/2015 20:56   Nm Pulmonary Perf And Vent  01/03/2016  CLINICAL DATA:  25 year old female with chest pain for 3 days. Patient on dialysis and with sickle cell disease. EXAM: NUCLEAR MEDICINE VENTILATION - PERFUSION LUNG SCAN TECHNIQUE: Ventilation images were obtained in multiple projections using inhaled aerosol Tc-31m DTPA. Perfusion images were obtained in multiple projections after intravenous injection of Tc-54m MAA. RADIOPHARMACEUTICALS:  29.0 millicuries Technetium-82m DTPA aerosol inhalation and 4.0 millicuries of Technetium-66m MAA IV COMPARISON:  04/14/2013 VQ study.  01/02/2016 chest radiograph FINDINGS: Ventilation: No focal ventilation defect. Perfusion: No wedge shaped peripheral perfusion defects to suggest acute pulmonary embolism. IMPRESSION: No evidence of acute pulmonary emboli.  No changes from 2014. Electronically Signed   By: Harmon Pier M.D.   On: 01/03/2016 17:02    MDM  I consulted with Dr. Hyman Hopes, from nephrology service as patient requires emergent hemodialysis due to hyperkalemia. She'll be treated with albuterol neb, intravenous D50, intravenous insulin and oral Kayexalate while here. I also consulted with Dr.Opyd from hospitalist service. She will be admitted to stepdown unit at [facility].   per suggestion of Dr. Hyman Hopes, patient should have repeat serum potassium level drawn immediately upon arrival  to [facility]. She has mildly elevated  troponin however she has chronically elevated troponins and symptoms are atypical for acute coronary syndrome. With positional chest pain which is pleuritic, symptoms more consistent with pericarditis. Doubt pulmonary embolism. Symptoms gradual in onset. Negative recent VQ scan.Hemoglobin is at baseline Diagnosis #1 chest pain #2Hyperkalemia #3anemia  CRITICAL CARE Performed by: Doug Sou Total critical care time: 40 minutes Critical care time was exclusive of separately billable procedures and treating other patients. Critical care was necessary to treat or prevent imminent or life-threatening deterioration. Critical care was time spent personally by me on the following activities: development of treatment plan with patient and/or surrogate as well as nursing, discussions with consultants, evaluation of patient's response to treatment, examination of patient, obtaining history from patient or surrogate, ordering and performing treatments and interventions, ordering and review of laboratory studies, ordering and review of radiographic studies, pulse oximetry and re-evaluation of patient's condition. Final diagnoses:  None     I personally performed the services described in this documentation, which was scribed in my presence. The recorded information has been reviewed and considered.   Doug Sou, MD 01/24/16 [phone]

## 2016-01-23 NOTE — ED Notes (Addendum)
Pt alert, NAD, calm, interactive, resps e/u, no dyspnea noted, skin W&D. C/o CP, (denies: sob, nvd, fever, dizziness or other sx), "do not have an HD home, last HD at [facility] last Monday (6d ago)", L upper arm fistula is current access site.

## 2016-01-23 NOTE — ED Notes (Signed)
Pt reports left chest pain rad to back starting this morning. Also reports facial swelling and distention of abdomen. Pt a/o NAD at triage.

## 2016-01-24 DIAGNOSIS — D571 Sickle-cell disease without crisis: Secondary | ICD-10-CM | POA: Diagnosis present

## 2016-01-24 DIAGNOSIS — N2581 Secondary hyperparathyroidism of renal origin: Secondary | ICD-10-CM | POA: Diagnosis present

## 2016-01-24 DIAGNOSIS — R079 Chest pain, unspecified: Secondary | ICD-10-CM | POA: Diagnosis present

## 2016-01-24 DIAGNOSIS — N186 End stage renal disease: Secondary | ICD-10-CM | POA: Diagnosis present

## 2016-01-24 DIAGNOSIS — E875 Hyperkalemia: Secondary | ICD-10-CM | POA: Diagnosis present

## 2016-01-24 DIAGNOSIS — Z9115 Patient's noncompliance with renal dialysis: Secondary | ICD-10-CM | POA: Diagnosis not present

## 2016-01-24 LAB — RENAL FUNCTION PANEL
ALBUMIN: 3.1 g/dL — AB (ref 3.5–5.0)
ANION GAP: 22 — AB (ref 5–15)
BUN: 108 mg/dL — ABNORMAL HIGH (ref 6–20)
CALCIUM: 10 mg/dL (ref 8.9–10.3)
CO2: 16 mmol/L — ABNORMAL LOW (ref 22–32)
Chloride: 99 mmol/L — ABNORMAL LOW (ref 101–111)
Creatinine, Ser: 15.95 mg/dL — ABNORMAL HIGH (ref 0.44–1.00)
GFR, EST AFRICAN AMERICAN: 3 mL/min — AB (ref >60)
GFR, EST NON AFRICAN AMERICAN: 3 mL/min — AB (ref >60)
GLUCOSE: 95 mg/dL (ref 65–99)
PHOSPHORUS: 9.1 mg/dL — AB (ref 2.5–4.6)
POTASSIUM: 5 mmol/L (ref 3.5–5.1)
SODIUM: 137 mmol/L (ref 135–145)

## 2016-01-24 LAB — CBC WITH DIFFERENTIAL/PLATELET
Basophils Absolute: 0 10*3/uL (ref 0.0–0.1)
Basophils Relative: 0 %
EOS PCT: 1 %
Eosinophils Absolute: 0 10*3/uL (ref 0.0–0.7)
HCT: 26.9 % — ABNORMAL LOW (ref 36.0–46.0)
Hemoglobin: 8.8 g/dL — ABNORMAL LOW (ref 12.0–15.0)
LYMPHS ABS: 1.5 10*3/uL (ref 0.7–4.0)
LYMPHS PCT: 25 %
MCH: 28.8 pg (ref 26.0–34.0)
MCHC: 32.7 g/dL (ref 30.0–36.0)
MCV: 87.9 fL (ref 78.0–100.0)
MONO ABS: 0.4 10*3/uL (ref 0.1–1.0)
Monocytes Relative: 7 %
Neutro Abs: 4.1 10*3/uL (ref 1.7–7.7)
Neutrophils Relative %: 67 %
Platelets: 199 10*3/uL (ref 150–400)
RBC: 3.06 MIL/uL — ABNORMAL LOW (ref 3.87–5.11)
RDW: 15.7 % — AB (ref 11.5–15.5)
WBC: 6 10*3/uL (ref 4.0–10.5)

## 2016-01-24 MED ORDER — ALBUTEROL SULFATE (2.5 MG/3ML) 0.083% IN NEBU
10.0000 mg | INHALATION_SOLUTION | Freq: Once | RESPIRATORY_TRACT | Status: DC
  Filled 2016-01-24: qty 12

## 2016-01-24 MED ORDER — FENTANYL CITRATE (PF) 100 MCG/2ML IJ SOLN
50.0000 ug | Freq: Once | INTRAMUSCULAR | Status: AC
  Administered 2016-01-24: 50 ug via INTRAVENOUS
  Filled 2016-01-24: qty 2

## 2016-01-24 MED ORDER — SODIUM POLYSTYRENE SULFONATE 15 GM/60ML PO SUSP
30.0000 g | Freq: Once | ORAL | Status: AC
  Administered 2016-01-24: 30 g via ORAL
  Filled 2016-01-24: qty 120

## 2016-01-24 MED ORDER — DEXTROSE 50 % IV SOLN
1.0000 | Freq: Once | INTRAVENOUS | Status: AC
  Administered 2016-01-24: 50 mL via INTRAVENOUS
  Filled 2016-01-24: qty 50

## 2016-01-24 MED ORDER — ALBUTEROL (5 MG/ML) CONTINUOUS INHALATION SOLN
10.0000 mg/h | INHALATION_SOLUTION | RESPIRATORY_TRACT | Status: DC
Start: 2016-01-24 — End: 2016-01-25
  Administered 2016-01-24: 10 mg/h via RESPIRATORY_TRACT

## 2016-01-24 MED ORDER — INSULIN ASPART 100 UNIT/ML IV SOLN
10.0000 [IU] | Freq: Once | INTRAVENOUS | Status: AC
  Administered 2016-01-24: 10 [IU] via INTRAVENOUS
  Filled 2016-01-24: qty 1

## 2016-01-24 NOTE — ED Notes (Signed)
Hospitalist paged

## 2016-01-24 NOTE — Progress Notes (Signed)
Accepted to stepdown at [facility], full admission.  24 yof with ESRD, SCD, hx of PE presents to [facility] with CP and edema in setting of no HD since 3/27.  Potassium is 6.5.  CXR without pulmonary edema and saturating 100% on rm air. Temporizing measures with D-50/insulin, albuterol neb, and kayexalate given in [facility] ED.  Dr. Hyman Hopes of nephrology consulting and will arrange for HD (likely not until 6am).  Please repeat a potassium level on arrival. Had same presentation a wk ago and was set up for outpt HD on discharge, but somehow fell through. PE w/u was not pursued. Reportedly had negative V/Q cpl weeks ago.

## 2016-01-24 NOTE — ED Notes (Addendum)
Pt up to b/r, alert, NAD, calm, interactive, steady gait. VSS.

## 2016-01-24 NOTE — ED Notes (Addendum)
Pt arrived as transfer from [facility] via [facility]. Pt reports chest pain to L side and "missed dialysis for a week", and currently has no schedule, "just goes when she can". Pt has had HD x 7 years, has L Arm Fistula.

## 2016-01-24 NOTE — ED Notes (Signed)
Communicated with [facility], [facility] ED CN and [facility] re: timeframe, transfer plan and scheduled HD in the morning. Pt on transfer list. Pending truck availability.  Dr. Elesa Massed aware.  Pt alert, NAD, calm, interactive, no dyspnea noted. VSS.

## 2016-01-24 NOTE — ED Notes (Addendum)
Report called to [facility] HD. Not aware of this pt, no HD orders received for this pt. Report called to [facility], RN [facility] ED CN, notified of pending arrival and plan for HD. Nephrology paged.

## 2016-01-24 NOTE — Progress Notes (Signed)
Patient discharged home with belongings after 4 hour treatment. Tolerated well

## 2016-01-24 NOTE — ED Provider Notes (Addendum)
2:30 AM  No stepdown beds at [facility].  Patient is stable. Has a positive troponin in the setting of end-stage renal disease which appears chronic for her. Chest pain appears chronic as well. Improving with narcotics. Potassium 6.5 with no EKG changes. Has received temporizing measures. Nephrology has been consult and will dialyze patient urgently in the morning. Given no available is for admission at [facility] at this time based on her level of acuity, we will transfer her to the emergency department at [facility] and she will need dialysis first thing in the morning. Discussed with Dr. Wilkie Aye in the ED who agrees to accept patient in transfer.  Layla Maw Edlyn Rosenburg, DO 01/24/16 0231   5:30 AM  Pt is still hemodynamically stable. Continues to have intermittent chest pain that is improving with narcotics. Again this appears to be chronic for patient and I have low suspicion at this time that this is ACS. We'll give another dose of IV fentanyl as this has worked better for her than Dilaudid. EMS on the way to transport to the [facility] emergency department to that she can be dialyzed.  Ilia Engelbert N Ezel Vallone, DO 01/24/16 0528  6:00 AM  Pt has drop in blood pressure after narcotics and after large bowel movement after Kayexalate. Blood pressure cuff had been recently switched.  Heart rates on low 100s. She is not symptomatic currently.  We'll continue to monitor closely. She is mentating normally. At this time I do not want to give her IV fluids which may volume overload her as she needs dialysis.  Layla Maw Satoria Dunlop, DO 01/24/16 [phone]

## 2016-01-24 NOTE — ED Notes (Signed)
Dr. Hyman Hopes returned page to [facility], notified of pt's transfer to [facility] ED for HD, pending orders for HD.

## 2016-01-24 NOTE — ED Notes (Signed)
Dialysis RN given report, requesting that patient be brought up in about 20 minutes
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
## 2016-01-24 NOTE — ED Notes (Signed)
BP intermitantly low (wrist and calf), asymptomatic, alert, NAD, calm, interactive, skin W&D. EDP aware. C/o pain returning, EDP aware.

## 2016-01-24 NOTE — ED Notes (Signed)
Nephrology paged.

## 2016-01-24 NOTE — ED Notes (Signed)
CAT neb in progress ?

## 2016-01-24 NOTE — Procedures (Signed)
Patient is getting HD here today, she does not require admission and can be dc'd from dialysis.    I was present at this dialysis session, have reviewed the session itself and made  appropriate changes Vinson Moselle MD [facility] pager [phone]    cell [phone] 01/24/2016, 3:50 PM

## 2016-01-24 NOTE — ED Provider Notes (Signed)
Medical screening exam:  Patient transferred from [facility] ER for treatment of chronic renal failure and need for dialysis. Patient is a chronic dialysis patient, reports that she recently moved back to the area does not currently have a dialysis center. Patient was found to be mildly volume overloaded, hyperkalemic. Patient was treated medically initially and arrangements were made for her to be admitted to [facility]. There are no stepdown beds, however, so she was transferred to the ER here to facilitate dialysis.  At arrival, patient is comfortable. She has not experiencing any significant shortness of breath currently. She has been treated with analgesics prior to arrival.   Gilda Crease, MD 01/24/16 [phone]

## 2016-01-25 ENCOUNTER — Ambulatory Visit: Payer: Medicare Other | Admitting: Internal Medicine

## 2016-01-25 LAB — I-STAT CHEM 8, ED
BUN: 20 mg/dL (ref 6–20)
CALCIUM ION: 1.15 mmol/L (ref 1.12–1.23)
CHLORIDE: 95 mmol/L — AB (ref 101–111)
Creatinine, Ser: 4.1 mg/dL — ABNORMAL HIGH (ref 0.44–1.00)
GLUCOSE: 103 mg/dL — AB (ref 65–99)
HCT: 33 % — ABNORMAL LOW (ref 36.0–46.0)
Hemoglobin: 11.2 g/dL — ABNORMAL LOW (ref 12.0–15.0)
POTASSIUM: 2.8 mmol/L — AB (ref 3.5–5.1)
Sodium: 138 mmol/L (ref 135–145)
TCO2: 27 mmol/L (ref 0–100)

## 2016-01-25 LAB — HEPATITIS B SURFACE ANTIGEN: Hepatitis B Surface Ag: NEGATIVE

## 2016-01-27 ENCOUNTER — Emergency Department (HOSPITAL_BASED_OUTPATIENT_CLINIC_OR_DEPARTMENT_OTHER): Payer: Medicare Other

## 2016-01-27 ENCOUNTER — Encounter (HOSPITAL_BASED_OUTPATIENT_CLINIC_OR_DEPARTMENT_OTHER): Payer: Self-pay | Admitting: *Deleted

## 2016-01-27 ENCOUNTER — Emergency Department (HOSPITAL_BASED_OUTPATIENT_CLINIC_OR_DEPARTMENT_OTHER)
Admission: EM | Admit: 2016-01-27 | Discharge: 2016-01-27 | Disposition: A | Discharge status: Home / Self Care | Payer: Medicare Other | Attending: Emergency Medicine | Admitting: Emergency Medicine

## 2016-01-27 DIAGNOSIS — N186 End stage renal disease: Secondary | ICD-10-CM | POA: Insufficient documentation

## 2016-01-27 DIAGNOSIS — Z862 Personal history of diseases of the blood and blood-forming organs and certain disorders involving the immune mechanism: Secondary | ICD-10-CM | POA: Diagnosis not present

## 2016-01-27 DIAGNOSIS — Z992 Dependence on renal dialysis: Secondary | ICD-10-CM | POA: Insufficient documentation

## 2016-01-27 DIAGNOSIS — I132 Hypertensive heart and chronic kidney disease with heart failure and with stage 5 chronic kidney disease, or end stage renal disease: Secondary | ICD-10-CM | POA: Diagnosis not present

## 2016-01-27 DIAGNOSIS — I509 Heart failure, unspecified: Secondary | ICD-10-CM | POA: Diagnosis not present

## 2016-01-27 DIAGNOSIS — Z87891 Personal history of nicotine dependence: Secondary | ICD-10-CM | POA: Insufficient documentation

## 2016-01-27 DIAGNOSIS — D5700 Hb-SS disease with crisis, unspecified: Secondary | ICD-10-CM | POA: Insufficient documentation

## 2016-01-27 DIAGNOSIS — F329 Major depressive disorder, single episode, unspecified: Secondary | ICD-10-CM | POA: Diagnosis not present

## 2016-01-27 DIAGNOSIS — Z940 Kidney transplant status: Secondary | ICD-10-CM | POA: Diagnosis not present

## 2016-01-27 DIAGNOSIS — R072 Precordial pain: Secondary | ICD-10-CM | POA: Diagnosis present

## 2016-01-27 LAB — CBC WITH DIFFERENTIAL/PLATELET
BASOS ABS: 0 10*3/uL (ref 0.0–0.1)
BASOS PCT: 1 %
EOS PCT: 0 %
Eosinophils Absolute: 0 10*3/uL (ref 0.0–0.7)
HCT: 30.7 % — ABNORMAL LOW (ref 36.0–46.0)
Hemoglobin: 9.8 g/dL — ABNORMAL LOW (ref 12.0–15.0)
LYMPHS PCT: 21 %
Lymphs Abs: 1.2 10*3/uL (ref 0.7–4.0)
MCH: 28.7 pg (ref 26.0–34.0)
MCHC: 31.9 g/dL (ref 30.0–36.0)
MCV: 90 fL (ref 78.0–100.0)
MONO ABS: 0.4 10*3/uL (ref 0.1–1.0)
Monocytes Relative: 8 %
NEUTROS ABS: 4 10*3/uL (ref 1.7–7.7)
Neutrophils Relative %: 71 %
PLATELETS: 231 10*3/uL (ref 150–400)
RBC: 3.41 MIL/uL — AB (ref 3.87–5.11)
RDW: 15.8 % — AB (ref 11.5–15.5)
WBC: 5.7 10*3/uL (ref 4.0–10.5)

## 2016-01-27 LAB — COMPREHENSIVE METABOLIC PANEL
ALBUMIN: 3.6 g/dL (ref 3.5–5.0)
ALT: 11 U/L — AB (ref 14–54)
AST: 21 U/L (ref 15–41)
Alkaline Phosphatase: 419 U/L — ABNORMAL HIGH (ref 38–126)
Anion gap: 16 — ABNORMAL HIGH (ref 5–15)
BUN: 69 mg/dL — AB (ref 6–20)
CHLORIDE: 98 mmol/L — AB (ref 101–111)
CO2: 23 mmol/L (ref 22–32)
CREATININE: 10.89 mg/dL — AB (ref 0.44–1.00)
Calcium: 9.8 mg/dL (ref 8.9–10.3)
GFR calc Af Amer: 5 mL/min — ABNORMAL LOW (ref >60)
GFR, EST NON AFRICAN AMERICAN: 4 mL/min — AB (ref >60)
GLUCOSE: 78 mg/dL (ref 65–99)
POTASSIUM: 5.2 mmol/L — AB (ref 3.5–5.1)
Sodium: 137 mmol/L (ref 135–145)
Total Bilirubin: 1 mg/dL (ref 0.3–1.2)
Total Protein: 7.7 g/dL (ref 6.5–8.1)

## 2016-01-27 LAB — TROPONIN I: TROPONIN I: 0.12 ng/mL — AB (ref <0.031)

## 2016-01-27 MED ORDER — HYDROMORPHONE HCL 1 MG/ML IJ SOLN
0.0250 mg/kg | INTRAMUSCULAR | Status: AC
  Administered 2016-01-27: 1.4 mg via INTRAVENOUS

## 2016-01-27 MED ORDER — OXYCODONE-ACETAMINOPHEN 5-325 MG PO TABS
1.0000 | ORAL_TABLET | Freq: Once | ORAL | Status: AC
  Administered 2016-01-27: 1 via ORAL
  Filled 2016-01-27: qty 1

## 2016-01-27 MED ORDER — HYDROMORPHONE HCL 1 MG/ML IJ SOLN
0.0250 mg/kg | INTRAMUSCULAR | Status: AC
  Filled 2016-01-27: qty 2

## 2016-01-27 MED ORDER — DIPHENHYDRAMINE HCL 25 MG PO CAPS
50.0000 mg | ORAL_CAPSULE | Freq: Once | ORAL | Status: AC
  Administered 2016-01-27: 50 mg via ORAL
  Filled 2016-01-27: qty 2

## 2016-01-27 MED ORDER — OXYCODONE-ACETAMINOPHEN 5-325 MG PO TABS: 1.0000 | ORAL_TABLET | Freq: Four times a day (QID) | ORAL | Status: DC | PRN

## 2016-01-27 MED FILL — OXYCODONE/APAP 5-325: 5-325 | 2 days supply | Qty: 10 | Fill #0

## 2016-01-27 NOTE — ED Notes (Signed)
Pain med held due to pt trying to find ride

## 2016-01-27 NOTE — ED Notes (Signed)
Chest pain. Last dialysis was 4 days ago. States she was seen here and admitted for pain.

## 2016-01-27 NOTE — ED Notes (Signed)
pts ride is here, pain meds given

## 2016-01-27 NOTE — ED Provider Notes (Signed)
CSN: [id number]     Arrival date & time 01/27/16  1331 History   First MD Initiated Contact with Patient 01/27/16 1354     Chief Complaint  Patient presents with  . Chest Pain     (Consider location/radiation/quality/duration/timing/severity/associated sxs/prior Treatment) HPI Complains of anterior substernal nonradiating chest pain onset 9 AM today gradual. Pain not made better or worse by anything. Feels like sickle cell crisis she's had in the past. She denies any shortness of breath denies fever denies cough. Pain is nonexertional. No other associated symptoms. Last hemodialysis session was 01/24/16, after transfer from [facility] to [facility] for evaluation for chest pain, hyperkalemia and hemodialysis. Past Medical History  Diagnosis Date  . Hemodialysis patient (HCC)   . Hypertension   . Pulmonary emboli (HCC) 01/2012    Bilateral, moderate clot burden, areas of pulmonary infarction and central necrosis  . CHF (congestive heart failure) (HCC)   . Cardiomyopathy   . Dysrhythmia     at times per pt.  . Anemia   . H/O transfusion of packed red blood cells   . 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 Polycystic kidney disease   . Cellulitis and abscess of face 03/22/2013  . Renal insufficiency   . Chronic pain   . GERD (gastroesophageal reflux disease)   . Depression   . Hyperkalemia 09/2015  . Pelvic fracture (HCC)   . Osteoporosis   . Sickle cell anemia (HCC)   . Narcotic abuse, continuous    Past Surgical History  Procedure Laterality Date  . Nephrectomy    . Av fistula placement    . Kidney transplant  2008    failed  . Tonsillectomy      as a child.  . Adenoidectomy    . Incision and drainage abscess Right 03/21/2013    Procedure: INCISION AND DRAINAGE RIGHT CHEEK ABSCESS REMOVAL OF FOREIGN BODY;  Surgeon: Serena Colonel, MD;  Location: [facility] OR;   Service: ENT;  Laterality: Right;  . Implantable cardioverter defibrillator implant Right 12/2014   Family History  Problem Relation Age of Onset  . Polycystic kidney disease Father    Social History  Substance Use Topics  . Smoking status: Former Smoker -- 0.00 packs/day for 1 years    Types: Cigarettes    Quit date: 12/08/2015  . Smokeless tobacco: Never Used  . Alcohol Use: No   OB History    No data available     Review of Systems  Constitutional: Negative.   HENT: Negative.   Respiratory: Negative.   Cardiovascular: Positive for chest pain.  Gastrointestinal: Negative.   Genitourinary:       Anuric  Musculoskeletal: Negative.   Skin: Negative.   Allergic/Immunologic: Positive for immunocompromised state.  Neurological: Negative.   Psychiatric/Behavioral: Negative.   All other systems reviewed and are negative.     Allergies  Tramadol; Vicodin; Buprenorphine hcl; Iohexol; and Morphine and related  Home Medications   Prior to Admission medications   Medication Sig Start Date End Date Taking? Authorizing Provider  amLODipine (NORVASC) 10 MG tablet Take 1 tablet (10 mg total) by mouth daily. 01/04/16   Quentin Angst, MD  calcium carbonate, dosed in mg elemental calcium, 1250 (500 Ca) MG/5ML Take 5 mLs (500 mg of elemental calcium total) by mouth every 6 (six) hours as needed for indigestion. 01/11/16   Rhetta Mura, MD  carvedilol (COREG) 6.25 MG tablet Take 1 tablet (6.25 mg total) by mouth 2 (two) times daily. 01/04/16   Quentin Angst, MD  cinacalcet (SENSIPAR) 30 MG tablet Take 1 tablet (30 mg total) by mouth daily with breakfast. 01/04/16   Quentin Angst, MD  isosorbide dinitrate (ISORDIL) 10 MG tablet Take 1 tablet (10 mg total) by mouth 3 (three) times daily. 01/04/16   Olugbemiga Annitta Needs, MD  LORazepam (ATIVAN) 1 MG tablet Take 1 tablet (1 mg total) by mouth daily as needed for anxiety. Patient states she takes one tablet by mouth every  day per patient 01/04/16   Quentin Angst, MD  multivitamin (RENA-VIT) TABS tablet Take 1 tablet by mouth daily. 01/04/16   Olugbemiga Annitta Needs, MD  oxyCODONE (ROXICODONE) 5 MG immediate release tablet Take 1 tablet (5 mg total) by mouth every 4 (four) hours as needed for severe pain. 01/04/16   Quentin Angst, MD  sertraline (ZOLOFT) 100 MG tablet Take 1 tablet (100 mg total) by mouth daily. 01/04/16   Quentin Angst, MD  sevelamer carbonate (RENVELA) 800 MG tablet Take 2-3 tablets (1,600-2,400 mg total) by mouth 3 (three) times daily with meals. 3 tabs with meals, 2 tabs with snacks 01/04/16 01/03/17  Olugbemiga E Jegede, MD   BP 150/104 mmHg  Pulse 92  Temp(Src) 97.9 F (36.6 C) (Oral)  Resp 18  Ht 5\' 3"  (1.6 m)  Wt 122 lb (55.339 kg)  BMI 21.62 kg/m2  SpO2   LMP 10/22/2011 Physical Exam  Constitutional: She appears well-developed and well-nourished. No distress.  HENT:  Head: Normocephalic and atraumatic.  Eyes: Conjunctivae are normal. Pupils are equal, round, and reactive to light.  Neck: Neck supple. No tracheal deviation present. No thyromegaly present.  Cardiovascular: Normal rate and regular rhythm.   No murmur heard. Pulmonary/Chest: Effort normal and breath sounds normal.  Abdominal: Soft. Bowel sounds are normal. She exhibits no distension. There is no tenderness.  Musculoskeletal: Normal range of motion. She exhibits no edema or tenderness.  Neurological: She is alert. Coordination normal.  Skin: Skin is warm and dry. No rash noted.  Psychiatric: She has a normal mood and affect.  Nursing note and vitals reviewed.   ED Course  Procedures (including critical care time) Labs Review Labs Reviewed - No data to display  Imaging Review No results found. I have personally reviewed and evaluated these images and lab results as part of my medical decision-making.   EKG Interpretation   Date/Time:  Thursday January 27 2016 13:46:38 EDT Ventricular Rate:   93 PR Interval:  254 QRS Duration: 100 QT Interval:  359 QTC Calculation: 446 R Axis:   110 Text Interpretation:  Sinus rhythm Prolonged PR interval Consider right  atrial enlargement Borderline right axis deviation Repol abnrm suggests  ischemia, lateral leads No significant change since last tracing Confirmed  by Ethelda Chick  MD, Demonie Kassa [id number]) on 01/27/2016 2:18:21 PM     Chest x-ray viewed by me 3:30 PM pain improved after treatment with intravenous hydromorphone and she feels ready to go home MDM  Strongly doubt pulmonary embolism. Pain is nonpleuritic. No shortness of breath. Substernal Final diagnoses:  None  Patient does not need emergent hemodialysis. No shortness of breath, clear x-rayed, near normal potassium, no EKG changes. She will receive hemodialysis tomorrow. Prescription Percocet Reticulocyte count canceled. Patient at baseline hemoglobin. Plan keep scheduled point with hemodialysis center at [facility]. Diagnosis #1 sickle cell crisis  #2hyperkalemia #3 hypertension  Doug Sou, MD 01/27/16 [phone]

## 2016-01-27 NOTE — Discharge Instructions (Signed)
Sickle Cell Anemia, Adult Keep your scheduled appointment for hemodialysis tomorrow. Return if pain is not well controlled with the medications prescribed. Call your primary care physician at the sickle cell clinic or refills of pain medication. Sickle cell anemia is a condition where your red blood cells are shaped like sickles. Red blood cells carry oxygen through the body. Sickle-shaped red blood cells do not live as long as normal red blood cells. They also clump together and block blood from flowing through the blood vessels. These things prevent the body from getting enough oxygen. Sickle cell anemia causes organ damage and pain. It also increases the risk of infection. HOME CARE  Drink enough fluid to keep your pee (urine) clear or pale yellow. Drink more in hot weather and during exercise.  Do not smoke. Smoking lowers oxygen levels in the blood.  Only take over-the-counter or prescription medicines as told by your doctor.  Take antibiotic medicines as told by your doctor. Make sure you finish them even if you start to feel better.  Take supplements as told by your doctor.  Consider wearing a medical alert bracelet. This tells anyone caring for you in an emergency of your condition.  When traveling, keep your medical information, doctors' names, and the medicines you take with you at all times.  If you have a fever, do not take fever medicines right away. This could cover up a problem. Tell your doctor.   Keep all follow-up visits with your doctor. Sickle cell anemia requires regular medical care. GET HELP IF: You have a fever. GET HELP RIGHT AWAY IF:  You feel dizzy or faint.  You have new belly (abdominal) pain, especially on the left side near the stomach area.  You have a lasting, often uncomfortable and painful erection of the penis (priapism). If it is not treated right away, you will become unable to have sex (impotence).  You have numbness in your arms or legs or you  have a hard time moving them.  You have a hard time talking.  You have a fever or lasting symptoms for more than 2-3 days.  You have a fever and your symptoms suddenly get worse.  You have signs or symptoms of infection. These include:  Chills.  Being more tired than normal (lethargy).  Irritability.  Poor eating.  Throwing up (vomiting).  You have pain that is not helped with medicine.  You have shortness of breath.  You have pain in your chest.  You are coughing up pus-like or bloody mucus.  You have a stiff neck.  Your feet or hands swell or have pain.  Your belly looks bloated.  Your joints hurt. MAKE SURE YOU:  Understand these instructions.  Will watch your condition.  Will get help right away if you are not doing well or get worse.   This information is not intended to replace advice given to you by your health care provider. Make sure you discuss any questions you have with your health care provider.   Document Released: 07/30/2013 Document Revised: 02/23/2015 Document Reviewed: 07/30/2013 Elsevier Interactive Patient Education [employer]

## 2016-01-28 ENCOUNTER — Encounter (HOSPITAL_BASED_OUTPATIENT_CLINIC_OR_DEPARTMENT_OTHER): Payer: Self-pay | Admitting: *Deleted

## 2016-01-28 ENCOUNTER — Other Ambulatory Visit: Payer: Self-pay

## 2016-01-28 ENCOUNTER — Emergency Department (HOSPITAL_BASED_OUTPATIENT_CLINIC_OR_DEPARTMENT_OTHER)
Admission: EM | Admit: 2016-01-28 | Discharge: 2016-01-29 | Disposition: A | Discharge status: Home / Self Care | Payer: Medicare Other | Attending: Emergency Medicine | Admitting: Emergency Medicine

## 2016-01-28 DIAGNOSIS — N186 End stage renal disease: Secondary | ICD-10-CM | POA: Insufficient documentation

## 2016-01-28 DIAGNOSIS — I509 Heart failure, unspecified: Secondary | ICD-10-CM | POA: Insufficient documentation

## 2016-01-28 DIAGNOSIS — M810 Age-related osteoporosis without current pathological fracture: Secondary | ICD-10-CM | POA: Insufficient documentation

## 2016-01-28 DIAGNOSIS — D5700 Hb-SS disease with crisis, unspecified: Secondary | ICD-10-CM | POA: Diagnosis not present

## 2016-01-28 DIAGNOSIS — Z79899 Other long term (current) drug therapy: Secondary | ICD-10-CM | POA: Insufficient documentation

## 2016-01-28 DIAGNOSIS — F329 Major depressive disorder, single episode, unspecified: Secondary | ICD-10-CM | POA: Insufficient documentation

## 2016-01-28 DIAGNOSIS — R072 Precordial pain: Secondary | ICD-10-CM | POA: Diagnosis present

## 2016-01-28 DIAGNOSIS — I132 Hypertensive heart and chronic kidney disease with heart failure and with stage 5 chronic kidney disease, or end stage renal disease: Secondary | ICD-10-CM | POA: Diagnosis not present

## 2016-01-28 DIAGNOSIS — Z992 Dependence on renal dialysis: Secondary | ICD-10-CM | POA: Diagnosis not present

## 2016-01-28 DIAGNOSIS — Z87891 Personal history of nicotine dependence: Secondary | ICD-10-CM | POA: Insufficient documentation

## 2016-01-28 LAB — CBC WITH DIFFERENTIAL/PLATELET
Basophils Absolute: 0 10*3/uL (ref 0.0–0.1)
Basophils Relative: 1 %
EOS ABS: 0.1 10*3/uL (ref 0.0–0.7)
EOS PCT: 1 %
HCT: 29.3 % — ABNORMAL LOW (ref 36.0–46.0)
HEMOGLOBIN: 9.3 g/dL — AB (ref 12.0–15.0)
LYMPHS ABS: 2.3 10*3/uL (ref 0.7–4.0)
Lymphocytes Relative: 31 %
MCH: 28.5 pg (ref 26.0–34.0)
MCHC: 31.7 g/dL (ref 30.0–36.0)
MCV: 89.9 fL (ref 78.0–100.0)
MONO ABS: 0.5 10*3/uL (ref 0.1–1.0)
MONOS PCT: 7 %
Neutro Abs: 4.4 10*3/uL (ref 1.7–7.7)
Neutrophils Relative %: 61 %
Platelets: 203 10*3/uL (ref 150–400)
RBC: 3.26 MIL/uL — ABNORMAL LOW (ref 3.87–5.11)
RDW: 15.7 % — AB (ref 11.5–15.5)
WBC: 7.3 10*3/uL (ref 4.0–10.5)

## 2016-01-28 LAB — BASIC METABOLIC PANEL
Anion gap: 17 — ABNORMAL HIGH (ref 5–15)
BUN: 82 mg/dL — AB (ref 6–20)
CALCIUM: 9.6 mg/dL (ref 8.9–10.3)
CHLORIDE: 100 mmol/L — AB (ref 101–111)
CO2: 23 mmol/L (ref 22–32)
CREATININE: 13.1 mg/dL — AB (ref 0.44–1.00)
GFR calc Af Amer: 4 mL/min — ABNORMAL LOW (ref >60)
GFR calc non Af Amer: 3 mL/min — ABNORMAL LOW (ref >60)
Glucose, Bld: 82 mg/dL (ref 65–99)
Potassium: 5.6 mmol/L — ABNORMAL HIGH (ref 3.5–5.1)
SODIUM: 140 mmol/L (ref 135–145)

## 2016-01-28 LAB — TROPONIN I: TROPONIN I: 0.14 ng/mL — AB (ref <0.031)

## 2016-01-28 LAB — CBG MONITORING, ED: Glucose-Capillary: 60 mg/dL — ABNORMAL LOW (ref 65–99)

## 2016-01-28 MED ORDER — ONDANSETRON HCL 4 MG/2ML IJ SOLN
4.0000 mg | Freq: Once | INTRAMUSCULAR | Status: AC
  Administered 2016-01-28: 4 mg via INTRAVENOUS
  Filled 2016-01-28: qty 2

## 2016-01-28 MED ORDER — HYDROMORPHONE HCL 1 MG/ML IJ SOLN
1.0000 mg | Freq: Once | INTRAMUSCULAR | Status: AC
  Administered 2016-01-28: 1 mg via INTRAVENOUS
  Filled 2016-01-28: qty 1

## 2016-01-28 MED ORDER — DIPHENHYDRAMINE HCL 50 MG/ML IJ SOLN
25.0000 mg | Freq: Once | INTRAMUSCULAR | Status: AC
  Administered 2016-01-28: 25 mg via INTRAVENOUS
  Filled 2016-01-28: qty 1

## 2016-01-28 NOTE — ED Notes (Signed)
RN at bedside

## 2016-01-28 NOTE — ED Provider Notes (Addendum)
CSN: [id number]     Arrival date & time 01/28/16  2137 History  By signing my name below, I, Doreatha Martin, attest that this documentation has been prepared under the direction and in the presence of Paula Libra, MD. Electronically Signed: Doreatha Martin, ED Scribe. 01/28/2016. 11:35 PM.    Chief Complaint  Patient presents with  . Chest Pain   The history is provided by the patient. No language interpreter was used.   HPI Comments: [name] Small is a 25 y.o. female with h/o sickle cell anemia, ESRD on hemodialysis, HTN, PE, CHF, cardiomyopathy who presents to the Emergency Department complaining of severe substernal and  left-sided chest pain onset at dialysis earlier today. Associated symptoms include nausea and one episode of emesis this morning. Pt describes her chest pain as heavy. She reports her pain is unaffected by breathing or exertion. Pt states she has taken Percocet with no relief of pain. She was last dialyzed this morning but quit an hour early due to her pain. She was seen in the ED yesterday for the same complaint, and was admitted to the hospital on the second of this month. She reports no h/o similar pain or sickle cell crises prior to this week. However, per medical records, she is seen frequently in the ED for complaints of CP. She denies nausea, diarrhea, fever or chills.   Past Medical History  Diagnosis Date  . Hemodialysis patient (HCC)   . Hypertension   . Pulmonary emboli (HCC) 01/2012    Bilateral, moderate clot burden, areas of pulmonary infarction and central necrosis  . CHF (congestive heart failure) (HCC)   . Cardiomyopathy   . Dysrhythmia     at times per pt.  . Anemia   . H/O transfusion of packed red blood cells   . 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 Polycystic kidney disease   . Cellulitis and abscess of face 03/22/2013  . Renal insufficiency   .  Chronic pain   . GERD (gastroesophageal reflux disease)   . Depression   . Hyperkalemia 09/2015  . Pelvic fracture (HCC)   . Osteoporosis   . Sickle cell anemia (HCC)   . Narcotic abuse, continuous    Past Surgical History  Procedure Laterality Date  . Nephrectomy    . Av fistula placement    . Kidney transplant  2008    failed  . Tonsillectomy      as a child.  . Adenoidectomy    . Incision and drainage abscess Right 03/21/2013    Procedure: INCISION AND DRAINAGE RIGHT CHEEK ABSCESS REMOVAL OF FOREIGN BODY;  Surgeon: Serena Colonel, MD;  Location: [facility] OR;  Service: ENT;  Laterality: Right;  . Implantable cardioverter defibrillator implant Right 12/2014   Family History  Problem Relation Age of Onset  . Polycystic kidney disease Father    Social History  Substance Use Topics  . Smoking status: Former Smoker -- 0.00 packs/day for 1 years    Types: Cigarettes    Quit date: 12/08/2015  . Smokeless tobacco: Never Used  . Alcohol Use: No   OB History    No data available     Review of Systems A complete 10 system review of systems was obtained and all systems are negative except as noted in the HPI and PMH.    Allergies  Tramadol; Vicodin; Buprenorphine hcl; Iohexol; and Morphine and related  Home Medications   Prior to Admission medications   Medication Sig Start Date End Date Taking? Authorizing Provider  amLODipine (NORVASC) 10 MG tablet Take 1 tablet (10 mg total) by mouth daily. 01/04/16   Quentin Angst, MD  calcium carbonate, dosed in mg elemental calcium, 1250 (500 Ca) MG/5ML Take 5 mLs (500 mg of elemental calcium total) by mouth every 6 (six) hours as needed for indigestion. 01/11/16   Rhetta Mura, MD  carvedilol (COREG) 6.25 MG tablet Take 1 tablet (6.25 mg total) by mouth 2 (two) times daily. 01/04/16   Quentin Angst, MD  cinacalcet (SENSIPAR) 30 MG tablet Take 1 tablet (30 mg total) by mouth daily with breakfast. 01/04/16   Quentin Angst, MD   isosorbide dinitrate (ISORDIL) 10 MG tablet Take 1 tablet (10 mg total) by mouth 3 (three) times daily. 01/04/16   Olugbemiga Annitta Needs, MD  LORazepam (ATIVAN) 1 MG tablet Take 1 tablet (1 mg total) by mouth daily as needed for anxiety. Patient states she takes one tablet by mouth every day per patient 01/04/16   Quentin Angst, MD  multivitamin (RENA-VIT) TABS tablet Take 1 tablet by mouth daily. 01/04/16   Olugbemiga Annitta Needs, MD  oxyCODONE (ROXICODONE) 5 MG immediate release tablet Take 1 tablet (5 mg total) by mouth every 4 (four) hours as needed for severe pain. 01/04/16   Quentin Angst, MD  oxyCODONE-acetaminophen (PERCOCET) 5-325 MG tablet Take 1-2 tablets by mouth every 6 (six) hours as needed for severe pain. 01/27/16   Doug Sou, MD  sertraline (ZOLOFT) 100 MG tablet Take 1 tablet (100 mg total) by mouth daily. 01/04/16   Quentin Angst, MD  sevelamer carbonate (RENVELA) 800 MG tablet Take 2-3 tablets (1,600-2,400 mg total) by mouth 3 (three) times daily with meals. 3 tabs with meals, 2 tabs with snacks 01/04/16 01/03/17  Olugbemiga E Jegede, MD   BP 151/102 mmHg  Pulse 102  Temp(Src) 98.3 F (36.8 C) (Oral)  Resp 22  Ht 5\' 3"  (1.6 m)  Wt 130 lb 6 oz (59.138 kg)  BMI 23.10 kg/m2  SpO2 99%  LMP 10/22/2011 Physical Exam General: Well-developed, well-nourished female in no acute distress; appearance consistent with age of record HENT: normocephalic; atraumatic Eyes: pupils equal, round and reactive to light; extraocular muscles intact Neck: supple Heart: regular rate and rhythm; no murmurs, rubs or gallops Lungs: clear to auscultation bilaterally Chest: Nontender Abdomen: soft; nondistended; nontender; no masses or hepatosplenomegaly; bowel sounds present Extremities: No deformity; full range of motion; pulses normal; dialysis fistula to the left upper arm with pulse and thrill  Neurologic: Awake, alert and oriented; motor function intact in all extremities and  symmetric; no facial droop Skin: Warm and dry Psychiatric: Flat affect   ED Course  Procedures (including critical care time) DIAGNOSTIC STUDIES: Oxygen Saturation is 99% on RA, normal by my interpretation.     EKG Interpretation   Date/Time:  Friday January 28 2016 21:46:55 EDT Ventricular Rate:  95 PR Interval:  256 QRS Duration: 92 QT Interval:  360 QTC Calculation: 452 R Axis:   78 Text Interpretation:  Sinus rhythm with 1st degree A-V block ST \\T \ T wave  abnormality, consider lateral ischemia Abnormal ECG Unchanged Confirmed by  Read Drivers  MD, Jonny Ruiz ([id number]) on 01/28/2016 11:07:48 PM      MDM   Nursing notes and vitals signs, including pulse oximetry, reviewed.  Summary of this visit's results, reviewed by myself:  Labs:  Results for orders  placed or performed during the hospital encounter of 01/28/16 (from the past 24 hour(s))  CBG monitoring, ED     Status: Abnormal   Collection Time: 01/28/16 11:03 PM  Result Value Ref Range   Glucose-Capillary 60 (L) 65 - 99 mg/dL  Troponin I     Status: Abnormal   Collection Time: 01/28/16 11:24 PM  Result Value Ref Range   Troponin I 0.14 (H) <0.031 ng/mL  CBC with Differential/Platelet     Status: Abnormal   Collection Time: 01/28/16 11:25 PM  Result Value Ref Range   WBC 7.3 4.0 - 10.5 K/uL   RBC 3.26 (L) 3.87 - 5.11 MIL/uL   Hemoglobin 9.3 (L) 12.0 - 15.0 g/dL   HCT 16.1 (L) 09.6 - 04.5 %   MCV 89.9 78.0 - 100.0 fL   MCH 28.5 26.0 - 34.0 pg   MCHC 31.7 30.0 - 36.0 g/dL   RDW 40.9 (H) 81.1 - 91.4 %   Platelets 203 150 - 400 K/uL   Neutrophils Relative % 61 %   Neutro Abs 4.4 1.7 - 7.7 K/uL   Lymphocytes Relative 31 %   Lymphs Abs 2.3 0.7 - 4.0 K/uL   Monocytes Relative 7 %   Monocytes Absolute 0.5 0.1 - 1.0 K/uL   Eosinophils Relative 1 %   Eosinophils Absolute 0.1 0.0 - 0.7 K/uL   Basophils Relative 1 %   Basophils Absolute 0.0 0.0 - 0.1 K/uL  Basic metabolic panel     Status: Abnormal   Collection Time: 01/28/16  11:25 PM  Result Value Ref Range   Sodium 140 135 - 145 mmol/L   Potassium 5.6 (H) 3.5 - 5.1 mmol/L   Chloride 100 (L) 101 - 111 mmol/L   CO2 23 22 - 32 mmol/L   Glucose, Bld 82 65 - 99 mg/dL   BUN 82 (H) 6 - 20 mg/dL   Creatinine, Ser 78.29 (H) 0.44 - 1.00 mg/dL   Calcium 9.6 8.9 - 56.2 mg/dL   GFR calc non Af Amer 3 (L) >60 mL/min   GFR calc Af Amer 4 (L) >60 mL/min   Anion gap 17 (H) 5 - 15    Imaging Studies: Dg Chest 2 View  01/27/2016  CLINICAL DATA:  Chest pain and shortness of Breath EXAM: CHEST  2 VIEW COMPARISON:  01/23/2016 FINDINGS: Cardiac shadow is enlarged. Defibrillator is again seen and stable. The lungs are well aerated bilaterally without focal infiltrate or sizable effusion. No acute bony abnormality is noted. IMPRESSION: No acute abnormality seen. Electronically Signed   By: Alcide Clever M.D.   On: 01/27/2016 14:52   12:32 AM The patient's pain is improved and she wants to go home at this time. Her troponin is elevated. Review of her records shows that it is chronically elevated and within her usual range.   Paula Libra, MD 01/29/16 1308  Paula Libra, MD 01/29/16 [phone]

## 2016-01-28 NOTE — ED Notes (Signed)
Chest pain. States she is having a sickle cell crisis. Last dialysis was this am. Pt was here yesterday with chest pain.

## 2016-01-29 LAB — RETICULOCYTES
RBC.: 3.27 MIL/uL — ABNORMAL LOW (ref 3.87–5.11)
RETIC COUNT ABSOLUTE: 49.1 10*3/uL (ref 19.0–186.0)
Retic Ct Pct: 1.5 % (ref 0.4–3.1)

## 2016-01-29 MED ORDER — HYDROMORPHONE HCL 1 MG/ML IJ SOLN: 1.0000 mg | Freq: Once | INTRAMUSCULAR | Status: DC

## 2016-01-29 NOTE — Discharge Instructions (Signed)
Sickle Cell Anemia, Adult Sickle cell anemia is a condition in which red blood cells have an abnormal "sickle" shape. This abnormal shape shortens the cells' life span, which results in a lower than normal concentration of red blood cells in the blood. The sickle shape also causes the cells to clump together and block free blood flow through the blood vessels. As a result, the tissues and organs of the body do not receive enough oxygen. Sickle cell anemia causes organ damage and pain and increases the risk of infection. CAUSES  Sickle cell anemia is a genetic disorder. Those who receive two copies of the gene have the condition, and those who receive one copy have the trait. RISK FACTORS The sickle cell gene is most common in people whose families originated in [place]. Other areas of the globe where sickle cell trait occurs include the [place], [place], the [place], and the [place].  SIGNS AND SYMPTOMS  Pain, especially in the extremities, back, chest, or abdomen (common). The pain may start suddenly or may develop following an illness, especially if there is dehydration. Pain can also occur due to overexertion or exposure to extreme temperature changes.  Frequent severe bacterial infections, especially certain types of pneumonia and meningitis.  Pain and swelling in the hands and feet.  Decreased activity.   Loss of appetite.   Change in behavior.  Headaches.  Seizures.  Shortness of breath or difficulty breathing.  Vision changes.  Skin ulcers. Those with the trait may not have symptoms or they may have mild symptoms.  DIAGNOSIS  Sickle cell anemia is diagnosed with blood tests that demonstrate the genetic trait. It is often diagnosed during the newborn period, due to mandatory testing nationwide. A variety of blood tests, X-rays, CT scans, MRI scans, ultrasounds, and lung function tests may also be done to monitor the condition. TREATMENT  Sickle  cell anemia may be treated with:  Medicines. You may be given pain medicines, antibiotic medicines (to treat and prevent infections) or medicines to increase the production of certain types of hemoglobin.  Fluids.  Oxygen.  Blood transfusions. HOME CARE INSTRUCTIONS   Drink enough fluid to keep your urine clear or pale yellow. Increase your fluid intake in hot weather and during exercise.  Do not smoke. Smoking lowers oxygen levels in the blood.   Only take over-the-counter or prescription medicines for pain, fever, or discomfort as directed by your health care provider.  Take antibiotics as directed by your health care provider. Make sure you finish them it even if you start to feel better.   Take supplements as directed by your health care provider.   Consider wearing a medical alert bracelet. This tells anyone caring for you in an emergency of your condition.   When traveling, keep your medical information, health care provider's names, and the medicines you take with you at all times.   If you develop a fever, do not take medicines to reduce the fever right away. This could cover up a problem that is developing. Notify your health care provider.  Keep all follow-up appointments with your health care provider. Sickle cell anemia requires regular medical care. SEEK MEDICAL CARE IF: You have a fever. SEEK IMMEDIATE MEDICAL CARE IF:   You feel dizzy or faint.   You have new abdominal pain, especially on the left side near the stomach area.   You develop a persistent, often uncomfortable and painful penile erection (priapism). If this is not treated immediately it   will lead to impotence.   You have numbness your arms or legs or you have a hard time moving them.   You have a hard time with speech.   You have a fever or persistent symptoms for more than 2-3 days.   You have a fever and your symptoms suddenly get worse.   You have signs or symptoms of infection.  These include:   Chills.   Abnormal tiredness (lethargy).   Irritability.   Poor eating.   Vomiting.   You develop pain that is not helped with medicine.   You develop shortness of breath.  You have pain in your chest.   You are coughing up pus-like or bloody sputum.   You develop a stiff neck.  Your feet or hands swell or have pain.  Your abdomen appears bloated.  You develop joint pain. MAKE SURE YOU:  Understand these instructions.   This information is not intended to replace advice given to you by your health care provider. Make sure you discuss any questions you have with your health care provider.   Document Released: 01/17/2006 Document Revised: 10/30/2014 Document Reviewed: 05/21/2013 Elsevier Interactive Patient Education 2016 [employer]  

## 2016-02-01 ENCOUNTER — Telehealth: Payer: Self-pay | Admitting: *Deleted

## 2016-02-01 ENCOUNTER — Ambulatory Visit (INDEPENDENT_AMBULATORY_CARE_PROVIDER_SITE_OTHER): Payer: Medicare Other | Admitting: Internal Medicine

## 2016-02-01 ENCOUNTER — Encounter: Payer: Self-pay | Admitting: Internal Medicine

## 2016-02-01 VITALS — BP 137/91 | HR 97 | Temp 98.3°F | Resp 18 | Ht 63.0 in | Wt 123.0 lb

## 2016-02-01 DIAGNOSIS — G894 Chronic pain syndrome: Secondary | ICD-10-CM

## 2016-02-01 DIAGNOSIS — I5042 Chronic combined systolic (congestive) and diastolic (congestive) heart failure: Secondary | ICD-10-CM | POA: Diagnosis not present

## 2016-02-01 DIAGNOSIS — N186 End stage renal disease: Secondary | ICD-10-CM | POA: Diagnosis not present

## 2016-02-01 DIAGNOSIS — Z95810 Presence of automatic (implantable) cardiac defibrillator: Secondary | ICD-10-CM | POA: Diagnosis not present

## 2016-02-01 DIAGNOSIS — Z992 Dependence on renal dialysis: Secondary | ICD-10-CM

## 2016-02-01 MED ORDER — ISOSORBIDE DINITRATE 10 MG PO TABS: 10.0000 mg | ORAL_TABLET | Freq: Three times a day (TID) | ORAL | Status: DC

## 2016-02-01 MED ORDER — LORAZEPAM 1 MG PO TABS: 1.0000 mg | ORAL_TABLET | Freq: Every day | ORAL | Status: DC | PRN

## 2016-02-01 MED ORDER — OXYCODONE HCL 5 MG PO TABS: 5.0000 mg | ORAL_TABLET | ORAL | Status: DC | PRN

## 2016-02-01 MED FILL — oxyCODONE HCL 5 MG TABS: 5 | 15 days supply | Qty: 90 | Fill #0

## 2016-02-01 MED FILL — LORazepam 1 MG TABS: 1 | 30 days supply | Qty: 30 | Fill #0

## 2016-02-01 NOTE — Patient Instructions (Signed)
Heart Failure Heart failure is a condition in which the heart has trouble pumping blood. This means your heart does not pump blood efficiently for your body to work well. In some cases of heart failure, fluid may back up into your lungs or you may have swelling (edema) in your lower legs. Heart failure is usually a long-term (chronic) condition. It is important for you to take good care of yourself and follow your health care provider's treatment plan. CAUSES  Some health conditions can cause heart failure. Those health conditions include:  High blood pressure (hypertension). Hypertension causes the heart muscle to work harder than normal. When pressure in the blood vessels is high, the heart needs to pump (contract) with more force in order to circulate blood throughout the body. High blood pressure eventually causes the heart to become stiff and weak.  Coronary artery disease (CAD). CAD is the buildup of cholesterol and fat (plaque) in the arteries of the heart. The blockage in the arteries deprives the heart muscle of oxygen and blood. This can cause chest pain and may lead to a heart attack. High blood pressure can also contribute to CAD.  Heart attack (myocardial infarction). A heart attack occurs when one or more arteries in the heart become blocked. The loss of oxygen damages the muscle tissue of the heart. When this happens, part of the heart muscle dies. The injured tissue does not contract as well and weakens the heart's ability to pump blood.  Abnormal heart valves. When the heart valves do not open and close properly, it can cause heart failure. This makes the heart muscle pump harder to keep the blood flowing.  Heart muscle disease (cardiomyopathy or myocarditis). Heart muscle disease is damage to the heart muscle from a variety of causes. These can include drug or alcohol abuse, infections, or unknown reasons. These can increase the risk of heart failure.  Lung disease. Lung disease  makes the heart work harder because the lungs do not work properly. This can cause a strain on the heart, leading it to fail.  Diabetes. Diabetes increases the risk of heart failure. High blood sugar contributes to high fat (lipid) levels in the blood. Diabetes can also cause slow damage to tiny blood vessels that carry important nutrients to the heart muscle. When the heart does not get enough oxygen and food, it can cause the heart to become weak and stiff. This leads to a heart that does not contract efficiently.  Other conditions can contribute to heart failure. These include abnormal heart rhythms, thyroid problems, and low blood counts (anemia). Certain unhealthy behaviors can increase the risk of heart failure, including:  Being overweight.  Smoking or chewing tobacco.  Eating foods high in fat and cholesterol.  Abusing illicit drugs or alcohol.  Lacking physical activity. SYMPTOMS  Heart failure symptoms may vary and can be hard to detect. Symptoms may include:  Shortness of breath with activity, such as climbing stairs.  Persistent cough.  Swelling of the feet, ankles, legs, or abdomen.  Unexplained weight gain.  Difficulty breathing when lying flat (orthopnea).  Waking from sleep because of the need to sit up and get more air.  Rapid heartbeat.  Fatigue and loss of energy.  Feeling light-headed, dizzy, or close to fainting.  Loss of appetite.  Nausea.  Increased urination during the night (nocturia). DIAGNOSIS  A diagnosis of heart failure is based on your history, symptoms, physical examination, and diagnostic tests. Diagnostic tests for heart failure may include:  Echocardiography. °· Electrocardiography. °· Chest X-ray. °· Blood tests. °· Exercise stress test. °· Cardiac angiography. °· Radionuclide scans. °TREATMENT  °Treatment is aimed at managing the symptoms of heart failure. Medicines, behavioral changes, or surgical intervention may be necessary to  treat heart failure. °· Medicines to help treat heart failure may include: °¨ Angiotensin-converting enzyme (ACE) inhibitors. This type of medicine blocks the effects of a blood protein called angiotensin-converting enzyme. ACE inhibitors relax (dilate) the blood vessels and help lower blood pressure. °¨ Angiotensin receptor blockers (ARBs). This type of medicine blocks the actions of a blood protein called angiotensin. Angiotensin receptor blockers dilate the blood vessels and help lower blood pressure. °¨ Water pills (diuretics). Diuretics cause the kidneys to remove salt and water from the blood. The extra fluid is removed through urination. This loss of extra fluid lowers the volume of blood the heart pumps. °¨ Beta blockers. These prevent the heart from beating too fast and improve heart muscle strength. °¨ Digitalis. This increases the force of the heartbeat. °· Healthy behavior changes include: °¨ Obtaining and maintaining a healthy weight. °¨ Stopping smoking or chewing tobacco. °¨ Eating heart-healthy foods. °¨ Limiting or avoiding alcohol. °¨ Stopping illicit drug use. °¨ Physical activity as directed by your health care provider. °· Surgical treatment for heart failure may include: °¨ A procedure to open blocked arteries, repair damaged heart valves, or remove damaged heart muscle tissue. °¨ A pacemaker to improve heart muscle function and control certain abnormal heart rhythms. °¨ An internal cardioverter defibrillator to treat certain serious abnormal heart rhythms. °¨ A left ventricular assist device (LVAD) to assist the pumping ability of the heart. °HOME CARE INSTRUCTIONS  °· Take medicines only as directed by your health care provider. Medicines are important in reducing the workload of your heart, slowing the progression of heart failure, and improving your symptoms. °¨ Do not stop taking your medicine unless directed by your health care provider. °¨ Do not skip any dose of medicine. °¨ Refill your  prescriptions before you run out of medicine. Your medicines are needed every day. °· Engage in moderate physical activity if directed by your health care provider. Moderate physical activity can benefit some people. The elderly and people with severe heart failure should consult with a health care provider for physical activity recommendations. °· Eat heart-healthy foods. Food choices should be free of trans fat and low in saturated fat, cholesterol, and salt (sodium). Healthy choices include fresh or frozen fruits and vegetables, fish, lean meats, legumes, fat-free or low-fat dairy products, and whole grain or high fiber foods. Talk to a dietitian to learn more about heart-healthy foods. °· Limit sodium if directed by your health care provider. Sodium restriction may reduce symptoms of heart failure in some people. Talk to a dietitian to learn more about heart-healthy seasonings. °· Use healthy cooking methods. Healthy cooking methods include roasting, grilling, broiling, baking, poaching, steaming, or stir-frying. Talk to a dietitian to learn more about healthy cooking methods. °· Limit fluids if directed by your health care provider. Fluid restriction may reduce symptoms of heart failure in some people. °· Weigh yourself every day. Daily weights are important in the early recognition of excess fluid. You should weigh yourself every morning after you urinate and before you eat breakfast. Wear the same amount of clothing each time you weigh yourself. Record your daily weight. Provide your health care provider with your weight record. °· Monitor and record your blood pressure if directed by your health care   provider.  Check your pulse if directed by your health care provider.  Lose weight if directed by your health care provider. Weight loss may reduce symptoms of heart failure in some people.  Stop smoking or chewing tobacco. Nicotine makes your heart work harder by causing your blood vessels to constrict.  Do not use nicotine gum or patches before talking to your health care provider.  Keep all follow-up visits as directed by your health care provider. This is important.  Limit alcohol intake to no more than 1 drink per day for nonpregnant women and 2 drinks per day for men. One drink equals 12 ounces of beer, 5 ounces of wine, or 1 ounces of hard liquor. Drinking more than that is harmful to your heart. Tell your health care provider if you drink alcohol several times a week. Talk with your health care provider about whether alcohol is safe for you. If your heart has already been damaged by alcohol or you have severe heart failure, drinking alcohol should be stopped completely.  Stop illicit drug use.  Stay up-to-date with immunizations. It is especially important to prevent respiratory infections through current pneumococcal and influenza immunizations.  Manage other health conditions such as hypertension, diabetes, thyroid disease, or abnormal heart rhythms as directed by your health care provider.  Learn to manage stress.  Plan rest periods when fatigued.  Learn strategies to manage high temperatures. If the weather is extremely hot:  Avoid vigorous physical activity.  Use air conditioning or fans or seek a cooler location.  Avoid caffeine and alcohol.  Wear loose-fitting, lightweight, and light-colored clothing.  Learn strategies to manage cold temperatures. If the weather is extremely cold:  Avoid vigorous physical activity.  Layer clothes.  Wear mittens or gloves, a hat, and a scarf when going outside.  Avoid alcohol.  Obtain ongoing education and support as needed.  Participate in or seek rehabilitation as needed to maintain or improve independence and quality of life. SEEK MEDICAL CARE IF:   You have a rapid weight gain.  You have increasing shortness of breath that is unusual for you.  You are unable to participate in your usual physical activities.  You tire  easily.  You cough more than normal, especially with physical activity.  You have any or more swelling in areas such as your hands, feet, ankles, or abdomen.  You are unable to sleep because it is hard to breathe.  You feel like your heart is beating fast (palpitations).  You become dizzy or light-headed upon standing up. SEEK IMMEDIATE MEDICAL CARE IF:   You have difficulty breathing.  There is a change in mental status such as decreased alertness or difficulty with concentration.  You have a pain or discomfort in your chest.  You have an episode of fainting (syncope). MAKE SURE YOU:   Understand these instructions.  Will watch your condition.  Will get help right away if you are not doing well or get worse.   This information is not intended to replace advice given to you by your health care provider. Make sure you discuss any questions you have with your health care provider.   Document Released: 10/09/2005 Document Revised: 02/23/2015 Document Reviewed: 11/08/2012 Elsevier Interactive Patient Education 2016 [employer] End-Stage Kidney Disease The kidneys are two organs that lie on either side of the spine between the middle of the back and the front of the abdomen. The kidneys:   Remove wastes and extra water from the blood.   Produce  important hormones. These help keep bones strong, regulate blood pressure, and help create red blood cells.   Balance the fluids and chemicals in the blood and tissues. End-stage kidney disease occurs when the kidneys are so damaged that they cannot do their job. When the kidneys cannot do their job, life-threatening problems occur. The body cannot stay clean and strong without the help of the kidneys. In end-stage kidney disease, the kidneys cannot get better.You need a new kidney or treatments to do some of the work healthy kidneys do in order to stay alive. CAUSES  End-stage kidney disease usually occurs when a long-lasting  (chronic) kidney disease gets worse. It may also occur after the kidneys are suddenly damaged (acute kidney injury).  SYMPTOMS   Swelling (edema) of the legs, ankles, or feet.   Tiredness (lethargy).   Nausea or vomiting.   Confusion.   Problems with urination, such as:   Decreased urine production.   Frequent urination, especially at night.   Frequent accidents in children who are potty trained.   Muscle twitches and cramps.   Persistent itchiness.   Loss of appetite.   Headaches.   Abnormally dark or light skin.   Numbness in the hands or feet.   Easy bruising.   Frequent hiccups.   Menstruation stops. DIAGNOSIS  Your health care provider will measure your blood pressure and take some tests. These may include:   Urine tests.   Blood tests.   Imaging tests, such as:   An ultrasound exam.   Computed tomography (CT).  A kidney biopsy. TREATMENT  There are two treatments for end-stage kidney disease:   A procedure that removes toxic wastes from the body (dialysis).   Receiving a new kidney (kidney transplant). Both of these treatments have serious risks and consequences. Your health care provider will help you determine which treatment is best for you based on your health, age, and other factors. In addition to having dialysis or a kidney transplant, you may need to take medicines to control high blood pressure (hypertension) and cholesterol and to decrease phosphorus levels in your blood.  HOME CARE INSTRUCTIONS  Follow your prescribed diet.   Take medicines only as directed by your health care provider.   Do not take any new medicines (prescription, over-the-counter, or nutritional supplements) unless approved by your health care provider. Many medicines can worsen your kidney damage or need to have the dose adjusted.   Keep all follow-up visits as directed by your health care provider. MAKE SURE YOU:  Understand these  instructions.  Will watch your condition.  Will get help right away if you are not doing well or get worse.   This information is not intended to replace advice given to you by your health care provider. Make sure you discuss any questions you have with your health care provider.   Document Released: 12/30/2003 Document Revised: 10/30/2014 Document Reviewed: 06/07/2012 [employer] Interactive Patient Education [employer]

## 2016-02-01 NOTE — Progress Notes (Signed)
Patient ID: [name] Small, female   DOB: 17-Apr-1991, 25 y.o.   MRN: [id number]   [name] Small, is a 25 y.o. female  [id number]:[id number]  [id number]  DOB - 10-Aug-1991  Chief Complaint  Patient presents with  . Follow-up    1 month        Subjective:   [name] Small is a 25 y.o. female here today for a follow up visit. 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 On further questioning patient claimed she was informed 2 years ago at [facility] in [place] that she has sickle cell disease, no further clarification. She has extensive medical history including end-stage renal disease on dialysis, hypertension, congestive heart failure status post AICD placement, major depression, failed kidney transplant, osteoporosis and pathological hip fracture. She now has a home dialysis center with [facility], she is dialyzed Mondays, Weds and Fridays, last dialysis session was yesterday. Her complaint today is generalized bony pain, she is out of her pain medications, she claims she either misplaced her pain pills bottle or was stolen. She still does not sleep well because of anxiety and boredom. She has no fever. She has no chest pain today. She has no shortness of breath. Patient does not have a cardiologist yet in [place]. No headache, No abdominal pain - No Nausea, No new weakness tingling or numbness.  Problem  Sickle Cell Anemia With Pain (Hcc) (Resolved)  Sickle Cell Anemia (Hcc) (Resolved)    ALLERGIES: Allergies  Allergen Reactions  . Tramadol Anaphylaxis  . Vicodin [Hydrocodone-Acetaminophen] Hives  . Buprenorphine Hcl Itching    Ok with oxycodone  . Iohexol Itching  . Morphine And Related Itching    Ok with oxycodone    PAST MEDICAL HISTORY: Past Medical History  Diagnosis Date  . Hemodialysis patient (HCC)   . Hypertension   . Pulmonary emboli (HCC)  01/2012    Bilateral, moderate clot burden, areas of pulmonary infarction and central necrosis  . CHF (congestive heart failure) (HCC)   . Cardiomyopathy   . Dysrhythmia     at times per pt.  . Anemia   . H/O transfusion of packed red blood cells   . 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 Polycystic kidney disease   . Cellulitis and abscess of face 03/22/2013  . Renal insufficiency   . Chronic pain   . GERD (gastroesophageal reflux disease)   . Depression   . Hyperkalemia 09/2015  . Pelvic fracture (HCC)   . Osteoporosis   . Sickle cell anemia (HCC)   . Narcotic abuse, continuous     MEDICATIONS AT HOME: Prior to Admission medications   Medication Sig Start Date End Date Taking? Authorizing Provider  amLODipine (NORVASC) 10 MG tablet Take 1 tablet (10 mg total) by mouth daily. 01/04/16  Yes Quentin Angst, MD  calcium carbonate, dosed in mg elemental calcium, 1250 (500 Ca) MG/5ML Take 5 mLs (500 mg of elemental calcium total) by mouth every 6 (six) hours as needed for indigestion. 01/11/16  Yes Rhetta Mura, MD  carvedilol (COREG) 6.25 MG tablet Take 1 tablet (6.25 mg total) by mouth 2 (two) times daily. 01/04/16  Yes Quentin Angst, MD  cinacalcet (SENSIPAR) 30 MG tablet Take 1 tablet (30 mg total) by mouth daily with breakfast. 01/04/16  Yes Quentin Angst, MD  isosorbide dinitrate (ISORDIL) 10 MG tablet Take 1 tablet (10 mg total) by mouth 3 (three) times daily. 02/01/16  Yes Milta Croson Annitta Needs, MD  LORazepam (ATIVAN) 1 MG tablet Take 1 tablet (1 mg total) by mouth daily as needed for anxiety. 02/01/16  Yes Quentin Angst, MD  multivitamin (RENA-VIT) TABS tablet Take 1 tablet by mouth daily. 01/04/16  Yes Refujio Haymer Annitta Needs, MD  oxyCODONE (ROXICODONE) 5 MG immediate release tablet Take 1 tablet (5 mg total) by mouth every 4 (four) hours as needed for severe  pain. 02/01/16  Yes Quentin Angst, MD  oxyCODONE-acetaminophen (PERCOCET) 5-325 MG tablet Take 1-2 tablets by mouth every 6 (six) hours as needed for severe pain. 01/27/16  Yes Doug Sou, MD  sertraline (ZOLOFT) 100 MG tablet Take 1 tablet (100 mg total) by mouth daily. 01/04/16  Yes Quentin Angst, MD  sevelamer carbonate (RENVELA) 800 MG tablet Take 2-3 tablets (1,600-2,400 mg total) by mouth 3 (three) times daily with meals. 3 tabs with meals, 2 tabs with snacks 01/04/16 01/03/17 Yes Keenan Dimitrov E Hyman Hopes, MD     Objective:   Filed Vitals:   02/01/16 0832  BP: 137/91  Pulse: 97  Temp: 98.3 F (36.8 C)  TempSrc: Oral  Resp: 18  Height: 5\' 3"  (1.6 m)  Weight: 123 lb (55.792 kg)  SpO2: 100%    Exam General appearance : Awake, alert, not in any distress. Speech Clear. Not toxic looking HEENT: Atraumatic and Normocephalic, pupils equally reactive to light and accomodation Neck: supple, no JVD. No cervical lymphadenopathy.  Chest:Good air entry bilaterally, no added sounds  CVS: Hyperdynamic S1 S2 + loud murmurs. Patient has Fistula  Abdomen: Bowel sounds present, Non tender and not distended with no gaurding, rigidity or rebound. Extremities: B/L Lower Ext shows no edema, both legs are warm to touch Neurology: Awake alert, and oriented X 3, CN II-XII intact, Non focal Skin:No Rash  Data Review Lab Results  Component Value Date   HGBA1C 5.8* 01/10/2016   HGBA1C 5.8* 04/03/2015   HGBA1C 5.7* 01/26/2015     Assessment & Plan   1. ESRD on dialysis (HCC)  - LORazepam (ATIVAN) 1 MG tablet; Take 1 tablet (1 mg total) by mouth daily as needed for anxiety.  Dispense: 30 tablet; Refill: 0 - Follow up with Nephrologist - 3 Days/Week Dialysis (MWF) - Renal Diet - Patient counseled  2. Chronic combined systolic and diastolic congestive heart failure (HCC)  - isosorbide dinitrate (ISORDIL) 10 MG tablet; Take 1 tablet (10 mg total) by mouth 3 (three) times daily.   Dispense: 90 tablet; Refill: 3  3. Chronic pain syndrome  - oxyCODONE (ROXICODONE) 5 MG immediate release tablet; Take 1 tablet (5 mg total) by mouth every 4 (four) hours as needed for severe pain.  Dispense: 90 tablet; Refill: 0  4. AICD (automatic cardioverter/defibrillator) present Ambulatory referral to Cardiology Continue current medications  Patient have been counseled extensively about nutrition and exercise  Return in about 4 weeks (around 02/29/2016) for CKD/ESRD, Heart Failure and Hypertension, Generalized Anxiety Disorder.  The patient was given clear instructions to go to ER or return to medical center if symptoms don't improve, worsen or new problems develop. The patient verbalized understanding. The patient was told to call to get lab results if they haven't heard anything in the next week.   This note has been created with Education officer, environmental. Any transcriptional errors are unintentional.  Jeanann Lewandowsky, MD, MHA, Maxwell Caul, CPE [facility] [place], [place] [phone]   02/01/2016, 9:19 AM

## 2016-02-01 NOTE — Progress Notes (Signed)
Patient is here for 1 month FU  Patient complains of chronic joint pain from SCD. Pain is currently scaled at an 8. Pain is in patients joints.  Patient signed pain contract agreement in the office.

## 2016-02-01 NOTE — Telephone Encounter (Signed)
Patient called requesting to [facility]. Pt stated that she had chest pain. Pt advised to go to the emergency for the chest pain. Pt then stated that she had the chest pain the day before and now she was having bone pain, esp when she tries to walk. Pt denies fever, abd pain, nausea, vomiting, chest pain and had 1 loose stool last night. Pt stated her pain was 8/10. Will check with the provider and let her know if she can come in. Pt then said that she has an appointment with Dr. Hyman Hopes this morning but wanted to know if she can come to the day hospital after she sees him. Pt was told that it would be up to Dr. Hyman Hopes to determine if she will be able to come to the day hospital. Pt voiced understanding. Dr.Jegede was notified of pt's call and he states he will see her this morning for her appointment.

## 2016-02-03 ENCOUNTER — Encounter: Payer: Self-pay | Admitting: Internal Medicine

## 2016-02-12 ENCOUNTER — Emergency Department (HOSPITAL_BASED_OUTPATIENT_CLINIC_OR_DEPARTMENT_OTHER): Payer: Medicare Other

## 2016-02-12 ENCOUNTER — Emergency Department (HOSPITAL_BASED_OUTPATIENT_CLINIC_OR_DEPARTMENT_OTHER)
Admission: EM | Admit: 2016-02-12 | Discharge: 2016-02-12 | Disposition: A | Discharge status: Home / Self Care | Payer: Medicare Other | Attending: Emergency Medicine | Admitting: Emergency Medicine

## 2016-02-12 DIAGNOSIS — I509 Heart failure, unspecified: Secondary | ICD-10-CM | POA: Diagnosis not present

## 2016-02-12 DIAGNOSIS — N186 End stage renal disease: Secondary | ICD-10-CM | POA: Diagnosis not present

## 2016-02-12 DIAGNOSIS — Z940 Kidney transplant status: Secondary | ICD-10-CM | POA: Insufficient documentation

## 2016-02-12 DIAGNOSIS — Z79899 Other long term (current) drug therapy: Secondary | ICD-10-CM | POA: Insufficient documentation

## 2016-02-12 DIAGNOSIS — R600 Localized edema: Secondary | ICD-10-CM | POA: Diagnosis not present

## 2016-02-12 DIAGNOSIS — F329 Major depressive disorder, single episode, unspecified: Secondary | ICD-10-CM | POA: Insufficient documentation

## 2016-02-12 DIAGNOSIS — A084 Viral intestinal infection, unspecified: Secondary | ICD-10-CM | POA: Diagnosis not present

## 2016-02-12 DIAGNOSIS — M810 Age-related osteoporosis without current pathological fracture: Secondary | ICD-10-CM | POA: Diagnosis not present

## 2016-02-12 DIAGNOSIS — Z992 Dependence on renal dialysis: Secondary | ICD-10-CM | POA: Diagnosis not present

## 2016-02-12 DIAGNOSIS — R079 Chest pain, unspecified: Secondary | ICD-10-CM | POA: Insufficient documentation

## 2016-02-12 DIAGNOSIS — I132 Hypertensive heart and chronic kidney disease with heart failure and with stage 5 chronic kidney disease, or end stage renal disease: Secondary | ICD-10-CM | POA: Insufficient documentation

## 2016-02-12 DIAGNOSIS — Z87891 Personal history of nicotine dependence: Secondary | ICD-10-CM | POA: Diagnosis not present

## 2016-02-12 DIAGNOSIS — R109 Unspecified abdominal pain: Secondary | ICD-10-CM | POA: Diagnosis present

## 2016-02-12 LAB — CBC WITH DIFFERENTIAL/PLATELET
Basophils Absolute: 0 10*3/uL (ref 0.0–0.1)
Basophils Relative: 0 %
Eosinophils Absolute: 0 10*3/uL (ref 0.0–0.7)
Eosinophils Relative: 1 %
HCT: 32.8 % — ABNORMAL LOW (ref 36.0–46.0)
Hemoglobin: 10.7 g/dL — ABNORMAL LOW (ref 12.0–15.0)
Lymphocytes Relative: 22 %
Lymphs Abs: 1.4 10*3/uL (ref 0.7–4.0)
MCH: 29.5 pg (ref 26.0–34.0)
MCHC: 32.6 g/dL (ref 30.0–36.0)
MCV: 90.4 fL (ref 78.0–100.0)
Monocytes Absolute: 0.6 10*3/uL (ref 0.1–1.0)
Monocytes Relative: 9 %
Neutro Abs: 4.5 10*3/uL (ref 1.7–7.7)
Neutrophils Relative %: 68 %
Platelets: 192 10*3/uL (ref 150–400)
RBC: 3.63 MIL/uL — ABNORMAL LOW (ref 3.87–5.11)
RDW: 17.5 % — ABNORMAL HIGH (ref 11.5–15.5)
WBC: 6.5 10*3/uL (ref 4.0–10.5)

## 2016-02-12 LAB — COMPREHENSIVE METABOLIC PANEL
ALT: 10 U/L — ABNORMAL LOW (ref 14–54)
AST: 28 U/L (ref 15–41)
Albumin: 3.6 g/dL (ref 3.5–5.0)
Alkaline Phosphatase: 393 U/L — ABNORMAL HIGH (ref 38–126)
Anion gap: 18 — ABNORMAL HIGH (ref 5–15)
BUN: 47 mg/dL — ABNORMAL HIGH (ref 6–20)
CO2: 22 mmol/L (ref 22–32)
Calcium: 9.5 mg/dL (ref 8.9–10.3)
Chloride: 97 mmol/L — ABNORMAL LOW (ref 101–111)
Creatinine, Ser: 8.27 mg/dL — ABNORMAL HIGH (ref 0.44–1.00)
GFR calc Af Amer: 7 mL/min — ABNORMAL LOW (ref >60)
GFR calc non Af Amer: 6 mL/min — ABNORMAL LOW (ref >60)
Glucose, Bld: 85 mg/dL (ref 65–99)
Potassium: 4.3 mmol/L (ref 3.5–5.1)
Sodium: 137 mmol/L (ref 135–145)
Total Bilirubin: 1 mg/dL (ref 0.3–1.2)
Total Protein: 8.1 g/dL (ref 6.5–8.1)

## 2016-02-12 LAB — LIPASE, BLOOD: Lipase: 41 U/L (ref 11–51)

## 2016-02-12 LAB — RETICULOCYTES
RBC.: 3.61 MIL/uL — ABNORMAL LOW (ref 3.87–5.11)
Retic Count, Absolute: 90.3 10*3/uL (ref 19.0–186.0)
Retic Ct Pct: 2.5 % (ref 0.4–3.1)

## 2016-02-12 LAB — TROPONIN I: Troponin I: 0.2 ng/mL — ABNORMAL HIGH (ref <0.031)

## 2016-02-12 MED ORDER — HYDROMORPHONE HCL 1 MG/ML IJ SOLN
1.0000 mg | Freq: Once | INTRAMUSCULAR | Status: AC
  Administered 2016-02-12: 1 mg via INTRAVENOUS
  Filled 2016-02-12: qty 1

## 2016-02-12 MED ORDER — DIPHENHYDRAMINE HCL 50 MG/ML IJ SOLN
25.0000 mg | Freq: Once | INTRAMUSCULAR | Status: AC
  Administered 2016-02-12: 25 mg via INTRAVENOUS
  Filled 2016-02-12: qty 1

## 2016-02-12 NOTE — ED Notes (Signed)
Pt states she took her oxy IR without relief from medication, question if she took prescription for percocet medication prescribed 01/27/2016 and she denied having medication

## 2016-02-12 NOTE — ED Provider Notes (Signed)
CSN: [id number]     Arrival date & time 02/12/16  1728 History   First MD Initiated Contact with Patient 02/12/16 1747     Chief Complaint  Patient presents with  . Abdominal Pain    HPI   25 year old female presents today with complaints of left-sided chest pain, nausea, vomiting. Patient reports that last week she had nausea vomiting and diarrhea, this is persisted since onset. She notes that she went to dialysis on Friday ( 2 days ago) and developed left-sided chest pain. Patient reports this chest pain feels different from previous chest pain, and notes that this is more on the left side of her chest. Patient reports that normally she gets chest pain when she skips dialysis, but this happened after dialysis. ( Chart review shows similar presentations). Patient is asked about her sickle cell pain crisis, she reports that she doesn't think she has sickle cell and hasn't had any problems recently. Chart review shows patient was seen on 01/28/2016 with sickle cell pain crisis and chest pain. Patient reports that she did have blood testing to confirm sickle cell. Patient reports that she took Percocet at home which she is prescribed for similar type pain, this did not improve her pain.   Patient denies any associated shortness of breath, cough, active abdominal pain, lower extremity swelling or edema. Patient does report a history of previous pulmonary embolus, she reports this feels different as she had significant shortness of breath associated with this.   Chart review shows patient has frequent episodes of chest pain, she has an IVC filter in place.  Past Medical History  Diagnosis Date  . Hemodialysis patient (HCC)   . Hypertension   . Pulmonary emboli (HCC) 01/2012    Bilateral, moderate clot burden, areas of pulmonary infarction and central necrosis  . CHF (congestive heart failure) (HCC)   . Cardiomyopathy   . Dysrhythmia     at times per pt.  . Anemia   . H/O transfusion of packed  red blood cells   . 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 Polycystic kidney disease   . Cellulitis and abscess of face 03/22/2013  . Renal insufficiency   . Chronic pain   . GERD (gastroesophageal reflux disease)   . Depression   . Hyperkalemia 09/2015  . Pelvic fracture (HCC)   . Osteoporosis   . Sickle cell anemia (HCC)   . Narcotic abuse, continuous    Past Surgical History  Procedure Laterality Date  . Nephrectomy    . Av fistula placement    . Kidney transplant  2008    failed  . Tonsillectomy      as a child.  . Adenoidectomy    . Incision and drainage abscess Right 03/21/2013    Procedure: INCISION AND DRAINAGE RIGHT CHEEK ABSCESS REMOVAL OF FOREIGN BODY;  Surgeon: Serena Colonel, MD;  Location: [facility] OR;  Service: ENT;  Laterality: Right;  . Implantable cardioverter defibrillator implant Right 12/2014   Family History  Problem Relation Age of Onset  . Polycystic kidney disease Father    Social History  Substance Use Topics  . Smoking status: Former Smoker -- 0.00 packs/day for 1 years    Types: Cigarettes    Quit date: 12/08/2015  . Smokeless tobacco: Never Used  . Alcohol Use: No   OB History    No data available     Review  of Systems    Allergies  Tramadol; Vicodin; Buprenorphine hcl; Iohexol; and Morphine and related  Home Medications   Prior to Admission medications   Medication Sig Start Date End Date Taking? Authorizing Provider  amLODipine (NORVASC) 10 MG tablet Take 1 tablet (10 mg total) by mouth daily. 01/04/16  Yes Quentin Angst, MD  calcium carbonate, dosed in mg elemental calcium, 1250 (500 Ca) MG/5ML Take 5 mLs (500 mg of elemental calcium total) by mouth every 6 (six) hours as needed for indigestion. 01/11/16  Yes Rhetta Mura, MD  carvedilol (COREG) 6.25 MG tablet Take 1 tablet (6.25 mg total) by mouth 2 (two) times daily.  01/04/16  Yes Quentin Angst, MD  cinacalcet (SENSIPAR) 30 MG tablet Take 1 tablet (30 mg total) by mouth daily with breakfast. 01/04/16  Yes Olugbemiga Annitta Needs, MD  isosorbide dinitrate (ISORDIL) 10 MG tablet Take 1 tablet (10 mg total) by mouth 3 (three) times daily. 02/01/16  Yes Olugbemiga Annitta Needs, MD  LORazepam (ATIVAN) 1 MG tablet Take 1 tablet (1 mg total) by mouth daily as needed for anxiety. 02/01/16  Yes Quentin Angst, MD  multivitamin (RENA-VIT) TABS tablet Take 1 tablet by mouth daily. 01/04/16  Yes Olugbemiga Annitta Needs, MD  oxyCODONE (ROXICODONE) 5 MG immediate release tablet Take 1 tablet (5 mg total) by mouth every 4 (four) hours as needed for severe pain. 02/01/16  Yes Quentin Angst, MD  oxyCODONE-acetaminophen (PERCOCET) 5-325 MG tablet Take 1-2 tablets by mouth every 6 (six) hours as needed for severe pain. 01/27/16   Doug Sou, MD  sevelamer carbonate (RENVELA) 800 MG tablet Take 2-3 tablets (1,600-2,400 mg total) by mouth 3 (three) times daily with meals. 3 tabs with meals, 2 tabs with snacks 01/04/16 01/03/17  Olugbemiga E Jegede, MD   BP 153/119 mmHg  Pulse 100  Temp(Src) 99.5 F (37.5 C) (Oral)  Resp 16  SpO2 97%   Physical Exam  Constitutional: She is oriented to person, place, and time. She appears well-developed and well-nourished.  HENT:  Head: Normocephalic and atraumatic.  Minor facial edema  Eyes: Conjunctivae are normal. Pupils are equal, round, and reactive to light. Right eye exhibits no discharge. Left eye exhibits no discharge. No scleral icterus.  Neck: Normal range of motion. No JVD present. No tracheal deviation present.  Cardiovascular: Regular rhythm, normal heart sounds and intact distal pulses.  Exam reveals no gallop and no friction rub.   No murmur heard. Pulmonary/Chest: Effort normal and breath sounds normal. No stridor. No respiratory distress. She has no wheezes. She has no rales. She exhibits no tenderness.  Abdominal: She  exhibits no distension and no mass. There is no tenderness. There is no rebound and no guarding.  Neurological: She is alert and oriented to person, place, and time. Coordination normal.  Skin: Skin is warm and dry. No rash noted. No erythema. No pallor.  Psychiatric: She has a normal mood and affect. Her behavior is normal. Judgment and thought content normal.  Nursing note and vitals reviewed.     ED Course  Procedures (including critical care time) Labs Review Labs Reviewed  CBC WITH DIFFERENTIAL/PLATELET - Abnormal; Notable for the following:    RBC 3.63 (*)    Hemoglobin 10.7 (*)    HCT 32.8 (*)    RDW 17.5 (*)    All other components within normal limits  COMPREHENSIVE METABOLIC PANEL - Abnormal; Notable for the following:    Chloride 97 (*)    BUN  47 (*)    Creatinine, Ser 8.27 (*)    ALT 10 (*)    Alkaline Phosphatase 393 (*)    GFR calc non Af Amer 6 (*)    GFR calc Af Amer 7 (*)    Anion gap 18 (*)    All other components within normal limits  RETICULOCYTES - Abnormal; Notable for the following:    RBC. 3.61 (*)    All other components within normal limits  TROPONIN I - Abnormal; Notable for the following:    Troponin I 0.20 (*)    All other components within normal limits  LIPASE, BLOOD    Imaging Review Dg Chest 2 View  02/12/2016  CLINICAL DATA:  Left-sided chest pain for 2 days. EXAM: CHEST  2 VIEW COMPARISON:  Multiple prior exams most recent radiograph 01/27/2016 FINDINGS: Single lead right-sided pacemaker remains in place. Cardiomegaly is stable. Unchanged scarring at the left lung base. No pulmonary edema, confluent airspace disease, pleural effusion or pneumothorax. Unchanged osseous structures. IMPRESSION: Unchanged appearance of the chest with stable cardiomegaly. No acute abnormality. Electronically Signed   By: Rubye Oaks M.D.   On: 02/12/2016 20:29   I have personally reviewed and evaluated these images and lab results as part of my medical  decision-making.   EKG Interpretation   Date/Time:  Saturday February 12 2016 19:07:19 EDT Ventricular Rate:  93 PR Interval:  235 QRS Duration: 100 QT Interval:  381 QTC Calculation: 474 R Axis:   68 Text Interpretation:  Sinus rhythm Prolonged PR interval Biatrial  enlargement Abnormal T, consider ischemia, diffuse leads No significant  change since last tracing Confirmed by BEATON  MD, ROBERT ([id number]) on  02/12/2016 8:52:40 PM      MDM   Final diagnoses:  Left sided chest pain  Viral gastroenteritis    Labs: CBC, CMP, lipase, reticulocytes, troponin  Imaging:  Consults:  Therapeutics: Benadryl, Dilaudid  Discharge Meds:   Assessment/Plan:  25 year old female presents today with numerous complaints. Patient reports intermittent abdominal discomfort nausea vomiting diarrhea. This presentation is most consistent with viral gastroenteritis. She reports mother is experiencing similar symptoms. Patient is afebrile, nontoxic. No episodes of vomiting or diarrhea while here in the ED.  Patient also reports left-sided chest pain. Patient has numerous ED visits with similar chest pain, although she notes that this is more lateral than usual. This does not seem to be worsened with deep inspiration, she has a heart rate of 95, SPO2 of 100, she is not to Drink. Patient has had numerous CT scans, perfusion scans, and x-rays recently they were all negative for pulmonary embolism. Patient has been seen 21 times in the last 6 months for similar related complaints. Patient has an IVC filter in place. Patient's symptoms are not consistent with pulmonary embolism, and consistent with her chronic chest pain. Very low suspicion for ACS in this patient. Patient improved after medication here in the ED and requesting discharge home. I see no findings on exam today that would necessitate further evaluation or management here in the ED, patient is nontoxic, in no acute distress. Patient's troponin 0.20  here, patient with persistently elevated troponin, this is not abnormal for her. She'll be discharged home with strict return precautions, primary care follow-up. Patient verbalized understanding and agreement to today's plan had no further questions or concerns at time of discharge.         Eyvonne Mechanic, PA-C 02/13/16 0032  Nelva Nay, MD 02/13/16 1311

## 2016-02-12 NOTE — ED Notes (Signed)
Attempted to start PIV x 3 in rt arm with no success.

## 2016-02-12 NOTE — ED Notes (Addendum)
Pt reports acute onset of abdominal pain without relief from zofran, pt's last dialysis was Friday she reports chest pain 8/10, typical sickle cell pain, but pt states she only has it when she skips dialysis and she states she did not skip it.

## 2016-02-12 NOTE — Discharge Instructions (Signed)
Please follow-up with your PCP for further evaluation and management. Please return immediately if any new or worsening sings or symptoms presents.

## 2016-02-12 NOTE — ED Notes (Signed)
Per pt report last HD was on Friday took 2 pounds off, in new treatment center to start 3 days a week. Also reports mother had a virus last week with n/v/d.

## 2016-02-20 ENCOUNTER — Emergency Department (HOSPITAL_BASED_OUTPATIENT_CLINIC_OR_DEPARTMENT_OTHER): Payer: Medicare Other

## 2016-02-20 ENCOUNTER — Encounter (HOSPITAL_BASED_OUTPATIENT_CLINIC_OR_DEPARTMENT_OTHER): Payer: Self-pay | Admitting: *Deleted

## 2016-02-20 ENCOUNTER — Emergency Department (HOSPITAL_BASED_OUTPATIENT_CLINIC_OR_DEPARTMENT_OTHER)
Admission: EM | Admit: 2016-02-20 | Discharge: 2016-02-20 | Disposition: A | Discharge status: Home / Self Care | Payer: Medicare Other | Attending: Emergency Medicine | Admitting: Emergency Medicine

## 2016-02-20 DIAGNOSIS — R079 Chest pain, unspecified: Secondary | ICD-10-CM | POA: Insufficient documentation

## 2016-02-20 DIAGNOSIS — N186 End stage renal disease: Secondary | ICD-10-CM | POA: Diagnosis not present

## 2016-02-20 DIAGNOSIS — F329 Major depressive disorder, single episode, unspecified: Secondary | ICD-10-CM | POA: Insufficient documentation

## 2016-02-20 DIAGNOSIS — Z87891 Personal history of nicotine dependence: Secondary | ICD-10-CM | POA: Diagnosis not present

## 2016-02-20 DIAGNOSIS — I132 Hypertensive heart and chronic kidney disease with heart failure and with stage 5 chronic kidney disease, or end stage renal disease: Secondary | ICD-10-CM | POA: Insufficient documentation

## 2016-02-20 DIAGNOSIS — Z79899 Other long term (current) drug therapy: Secondary | ICD-10-CM | POA: Insufficient documentation

## 2016-02-20 DIAGNOSIS — Z992 Dependence on renal dialysis: Secondary | ICD-10-CM | POA: Diagnosis not present

## 2016-02-20 DIAGNOSIS — I509 Heart failure, unspecified: Secondary | ICD-10-CM | POA: Insufficient documentation

## 2016-02-20 LAB — CBC WITH DIFFERENTIAL/PLATELET
BASOS ABS: 0.1 10*3/uL (ref 0.0–0.1)
BASOS PCT: 1 %
Eosinophils Absolute: 0 10*3/uL (ref 0.0–0.7)
Eosinophils Relative: 0 %
HCT: 33.3 % — ABNORMAL LOW (ref 36.0–46.0)
Hemoglobin: 10.9 g/dL — ABNORMAL LOW (ref 12.0–15.0)
LYMPHS PCT: 26 %
Lymphs Abs: 1.7 10*3/uL (ref 0.7–4.0)
MCH: 29.4 pg (ref 26.0–34.0)
MCHC: 32.7 g/dL (ref 30.0–36.0)
MCV: 89.8 fL (ref 78.0–100.0)
Monocytes Absolute: 0.5 10*3/uL (ref 0.1–1.0)
Monocytes Relative: 7 %
NEUTROS ABS: 4.3 10*3/uL (ref 1.7–7.7)
Neutrophils Relative %: 66 %
Platelets: 243 10*3/uL (ref 150–400)
RBC: 3.71 MIL/uL — AB (ref 3.87–5.11)
RDW: 17.4 % — ABNORMAL HIGH (ref 11.5–15.5)
WBC: 6.6 10*3/uL (ref 4.0–10.5)

## 2016-02-20 LAB — COMPREHENSIVE METABOLIC PANEL
ALT: 9 U/L — ABNORMAL LOW (ref 14–54)
ANION GAP: 19 — AB (ref 5–15)
AST: 22 U/L (ref 15–41)
Albumin: 3.6 g/dL (ref 3.5–5.0)
Alkaline Phosphatase: 338 U/L — ABNORMAL HIGH (ref 38–126)
BILIRUBIN TOTAL: 0.9 mg/dL (ref 0.3–1.2)
BUN: 84 mg/dL — ABNORMAL HIGH (ref 6–20)
CO2: 21 mmol/L — ABNORMAL LOW (ref 22–32)
Calcium: 10 mg/dL (ref 8.9–10.3)
Chloride: 99 mmol/L — ABNORMAL LOW (ref 101–111)
Creatinine, Ser: 11.41 mg/dL — ABNORMAL HIGH (ref 0.44–1.00)
GFR, EST AFRICAN AMERICAN: 5 mL/min — AB (ref >60)
GFR, EST NON AFRICAN AMERICAN: 4 mL/min — AB (ref >60)
Glucose, Bld: 95 mg/dL (ref 65–99)
POTASSIUM: 5.6 mmol/L — AB (ref 3.5–5.1)
Sodium: 139 mmol/L (ref 135–145)
TOTAL PROTEIN: 7.7 g/dL (ref 6.5–8.1)

## 2016-02-20 LAB — TROPONIN I: Troponin I: 0.18 ng/mL — ABNORMAL HIGH (ref <0.031)

## 2016-02-20 MED ORDER — SODIUM POLYSTYRENE SULFONATE 15 GM/60ML PO SUSP
30.0000 g | Freq: Once | ORAL | Status: AC
  Administered 2016-02-20: 30 g via ORAL
  Filled 2016-02-20: qty 120

## 2016-02-20 MED ORDER — HYDROMORPHONE HCL 1 MG/ML IJ SOLN
1.0000 mg | Freq: Once | INTRAMUSCULAR | Status: AC
  Administered 2016-02-20: 1 mg via INTRAVENOUS
  Filled 2016-02-20: qty 1

## 2016-02-20 MED ORDER — DIPHENHYDRAMINE HCL 25 MG PO CAPS
50.0000 mg | ORAL_CAPSULE | Freq: Once | ORAL | Status: AC
  Administered 2016-02-20: 50 mg via ORAL
  Filled 2016-02-20: qty 2

## 2016-02-20 NOTE — ED Notes (Signed)
Pt reports chest pain since last night. Dialysis patient with Hx of frequent ED visits for chest pain. Last dialysis was Wed. States she missed Friday due to transportation issues. EKG done in triage and given to EDP [place] to review

## 2016-02-20 NOTE — ED Provider Notes (Signed)
CSN: [id number]     Arrival date & time 02/20/16  1950 History   First MD Initiated Contact with Patient 02/20/16 2034     Chief Complaint  Patient presents with  . Chest Pain     (Consider location/radiation/quality/duration/timing/severity/associated sxs/prior Treatment) HPI [name] Small is a 25 y.o. female with multiple medical problems including renal failure on hemodialysis, pulmonary emboli, CHF, sickle cell anemia, comes in for evaluation of chest pain. Patient reports she typically has a M, W, F dialysis schedule, but did not go home Friday due to transportation issues. She reports chest pain started Friday evening. She reports left-sided chest pain, similar to previous chest pain she has been evaluated for in the emergency department. She has had multiple visits to the emergency department for chest pain. She also reports like she can't get a deep breath. Nothing makes problem better or worse, no intervention is tried. No other modifying factors.  Past Medical History  Diagnosis Date  . Hemodialysis patient (HCC)   . Hypertension   . Pulmonary emboli (HCC) 01/2012    Bilateral, moderate clot burden, areas of pulmonary infarction and central necrosis  . CHF (congestive heart failure) (HCC)   . Cardiomyopathy   . Dysrhythmia     at times per pt.  . Anemia   . H/O transfusion of packed red blood cells   . 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 Polycystic kidney disease   . Cellulitis and abscess of face 03/22/2013  . Renal insufficiency   . Chronic pain   . GERD (gastroesophageal reflux disease)   . Depression   . Hyperkalemia 09/2015  . Pelvic fracture (HCC)   . Osteoporosis   . Sickle cell anemia (HCC)   . Narcotic abuse, continuous    Past Surgical History  Procedure Laterality Date  . Nephrectomy    . Av fistula placement    . Kidney transplant  2008    failed   . Tonsillectomy      as a child.  . Adenoidectomy    . Incision and drainage abscess Right 03/21/2013    Procedure: INCISION AND DRAINAGE RIGHT CHEEK ABSCESS REMOVAL OF FOREIGN BODY;  Surgeon: Serena Colonel, MD;  Location: [facility] OR;  Service: ENT;  Laterality: Right;  . Implantable cardioverter defibrillator implant Right 12/2014   Family History  Problem Relation Age of Onset  . Polycystic kidney disease Father    Social History  Substance Use Topics  . Smoking status: Former Smoker -- 0.00 packs/day for 1 years    Types: Cigarettes    Quit date: 12/08/2015  . Smokeless tobacco: Never Used  . Alcohol Use: No   OB History    No data available     Review of Systems A 10 point review of systems was completed and was negative except for pertinent positives and negatives as mentioned in the history of present illness     Allergies  Tramadol; Vicodin; Buprenorphine hcl; Iohexol; and Morphine and related  Home Medications   Prior to Admission medications   Medication Sig Start Date End Date Taking? Authorizing Provider  amLODipine (NORVASC) 10 MG tablet Take 1 tablet (10 mg total) by mouth daily. 01/04/16   Quentin Angst, MD  calcium carbonate, dosed in mg elemental calcium, 1250 (500 Ca) MG/5ML Take 5 mLs (500 mg of elemental calcium total) by mouth every 6 (six) hours as needed for  indigestion. 01/11/16   Rhetta Mura, MD  carvedilol (COREG) 6.25 MG tablet Take 1 tablet (6.25 mg total) by mouth 2 (two) times daily. 01/04/16   Quentin Angst, MD  cinacalcet (SENSIPAR) 30 MG tablet Take 1 tablet (30 mg total) by mouth daily with breakfast. 01/04/16   Quentin Angst, MD  isosorbide dinitrate (ISORDIL) 10 MG tablet Take 1 tablet (10 mg total) by mouth 3 (three) times daily. 02/01/16   Olugbemiga Annitta Needs, MD  LORazepam (ATIVAN) 1 MG tablet Take 1 tablet (1 mg total) by mouth daily as needed for anxiety. 02/01/16   Quentin Angst, MD  multivitamin (RENA-VIT) TABS  tablet Take 1 tablet by mouth daily. 01/04/16   Olugbemiga Annitta Needs, MD  oxyCODONE (ROXICODONE) 5 MG immediate release tablet Take 1 tablet (5 mg total) by mouth every 4 (four) hours as needed for severe pain. 02/01/16   Quentin Angst, MD  oxyCODONE-acetaminophen (PERCOCET) 5-325 MG tablet Take 1-2 tablets by mouth every 6 (six) hours as needed for severe pain. 01/27/16   Doug Sou, MD  sevelamer carbonate (RENVELA) 800 MG tablet Take 2-3 tablets (1,600-2,400 mg total) by mouth 3 (three) times daily with meals. 3 tabs with meals, 2 tabs with snacks 01/04/16 01/03/17  Olugbemiga E Jegede, MD   BP 169/139 mmHg  Pulse 99  Temp(Src) 98.6 F (37 C) (Oral)  Resp 14  SpO2 100% Physical Exam  Constitutional: She is oriented to person, place, and time. She appears well-developed and well-nourished.  HENT:  Head: Normocephalic and atraumatic.  Mouth/Throat: Oropharynx is clear and moist.  Face appears more swollen  Eyes: Conjunctivae are normal. Pupils are equal, round, and reactive to light. Right eye exhibits no discharge. Left eye exhibits no discharge. No scleral icterus.  Neck: Neck supple.  Cardiovascular: Regular rhythm and normal heart sounds.   Heart rate low 100s. Regular rhythm, normal heart sounds.  Pulmonary/Chest: Effort normal and breath sounds normal. No respiratory distress. She has no wheezes. She has no rales.  Abdominal: Soft. There is no tenderness.  Musculoskeletal: She exhibits no edema or tenderness.  Neurological: She is alert and oriented to person, place, and time.  Cranial Nerves II-XII grossly intact  Skin: Skin is warm and dry. No rash noted.  Psychiatric: She has a normal mood and affect.  Nursing note and vitals reviewed.   ED Course  Procedures (including critical care time) Labs Review Labs Reviewed  COMPREHENSIVE METABOLIC PANEL - Abnormal; Notable for the following:    Potassium 5.6 (*)    Chloride 99 (*)    CO2 21 (*)    BUN 84 (*)     Creatinine, Ser 11.41 (*)    ALT 9 (*)    Alkaline Phosphatase 338 (*)    GFR calc non Af Amer 4 (*)    GFR calc Af Amer 5 (*)    Anion gap 19 (*)    All other components within normal limits  CBC WITH DIFFERENTIAL/PLATELET - Abnormal; Notable for the following:    RBC 3.71 (*)    Hemoglobin 10.9 (*)    HCT 33.3 (*)    RDW 17.4 (*)    All other components within normal limits  TROPONIN I - Abnormal; Notable for the following:    Troponin I 0.18 (*)    All other components within normal limits    Imaging Review Dg Chest 2 View  02/20/2016  CLINICAL DATA:  Left-sided chest pain, onset last night. History of hypertension,  congestive heart failure, sickle cell. EXAM: CHEST  2 VIEW COMPARISON:  02/12/2016 FINDINGS: Cardiac pacemaker. Diffuse cardiac enlargement. No pulmonary vascular congestion. No focal airspace disease or consolidation. No blunting of costophrenic angles. No pneumothorax. Mediastinal contours appear intact. IMPRESSION: Diffuse cardiac enlargement. No evidence of active pulmonary disease. Electronically Signed   By: Burman Nieves M.D.   On: 02/20/2016 21:55   I have personally reviewed and evaluated these images and lab results as part of my medical decision-making.   EKG Interpretation   Date/Time:  Sunday February 20 2016 19:57:52 EDT Ventricular Rate:  102 PR Interval:  258 QRS Duration: 88 QT Interval:  326 QTC Calculation: 424 R Axis:   82 Text Interpretation:  Sinus tachycardia with 1st degree A-V block Possible  Left atrial enlargement ST \\T \ T wave abnormality, consider inferolateral  ischemia Abnormal ECG no significant change since April 22 Confirmed by  Criss Alvine  MD, SCOTT [phone]) on 02/20/2016 8:05:38 PM      MDM  [name] Small is a 25 y.o. female with multiple medical problems including hemodialysis due to end-stage renal disease, chronic chest pain here for evaluation of acute on chronic chest pain. States symptoms today are similar to previous. On  arrival, she is in no apparent distress, hemodynamically stable, afebrile. Chest x-ray shows no acute cardiopulmonary pathology, EKG is unchanged, troponin is elevated, but less than her baseline. Labs are significant for an elevated potassium of 5.6, creatinine is elevated at 11. Discussed with nephrology, Dr. Lacy Duverney, recommends 30 mg of Kayexalate and encouraging patient to go to dialysis appointment tomorrow. Patient understands she needs to go to dialysis tomorrow. Also understands to return for any new or worsening symptoms. Final diagnoses:  Chest pain, unspecified chest pain type       Joycie Peek, PA-C 02/21/16 [id number]  Pricilla Loveless, MD 02/21/16 0140

## 2016-02-20 NOTE — ED Notes (Signed)
Pt reports swelling to face occurred this am. Was at HD on Monday and Wednesday. Monday treatment took 2.2 kg off and Wednesday treatment took 1.8 kg off. Pt reports that HD center told her she was at dry weight. Had transportation issue on Friday thus unable to get HD treatment. Reports chest pain , increase when laying back. Distended abdomen yet had normal bowel movement today.

## 2016-02-20 NOTE — ED Notes (Signed)
Restricted extremity band placed on pts left arm. 

## 2016-02-20 NOTE — Discharge Instructions (Signed)
There does not appear to be an emergent cause for your symptoms at this time. It is important for you to go to your dialysis center tomorrow for dialysis. Follow-up with your doctor next week. Return to ED for any new or worsening symptoms.

## 2016-02-28 ENCOUNTER — Other Ambulatory Visit: Payer: Self-pay | Admitting: Vascular Surgery

## 2016-02-29 ENCOUNTER — Ambulatory Visit (INDEPENDENT_AMBULATORY_CARE_PROVIDER_SITE_OTHER): Payer: Medicare Other | Admitting: Internal Medicine

## 2016-02-29 ENCOUNTER — Encounter: Payer: Self-pay | Admitting: Internal Medicine

## 2016-02-29 VITALS — BP 123/91 | HR 91 | Temp 98.3°F | Resp 18 | Ht 63.0 in | Wt 120.0 lb

## 2016-02-29 DIAGNOSIS — G894 Chronic pain syndrome: Secondary | ICD-10-CM

## 2016-02-29 DIAGNOSIS — I5022 Chronic systolic (congestive) heart failure: Secondary | ICD-10-CM | POA: Diagnosis not present

## 2016-02-29 DIAGNOSIS — Z992 Dependence on renal dialysis: Secondary | ICD-10-CM | POA: Diagnosis not present

## 2016-02-29 DIAGNOSIS — N186 End stage renal disease: Secondary | ICD-10-CM

## 2016-02-29 MED ORDER — LORAZEPAM 1 MG PO TABS: 1.0000 mg | ORAL_TABLET | Freq: Every day | ORAL | Status: DC | PRN

## 2016-02-29 MED ORDER — OXYCODONE HCL 5 MG PO TABS: 5.0000 mg | ORAL_TABLET | ORAL | Status: DC | PRN

## 2016-02-29 MED ORDER — DICLOFENAC SODIUM 1 % TD GEL: 2.0000 g | Freq: Four times a day (QID) | TRANSDERMAL | Status: DC

## 2016-02-29 NOTE — Patient Instructions (Signed)
End-Stage Kidney Disease The kidneys are two organs that lie on either side of the spine between the middle of the back and the front of the abdomen. The kidneys:   Remove wastes and extra water from the blood.   Produce important hormones. These help keep bones strong, regulate blood pressure, and help create red blood cells.   Balance the fluids and chemicals in the blood and tissues. End-stage kidney disease occurs when the kidneys are so damaged that they cannot do their job. When the kidneys cannot do their job, life-threatening problems occur. The body cannot stay clean and strong without the help of the kidneys. In end-stage kidney disease, the kidneys cannot get better.You need a new kidney or treatments to do some of the work healthy kidneys do in order to stay alive. CAUSES  End-stage kidney disease usually occurs when a long-lasting (chronic) kidney disease gets worse. It may also occur after the kidneys are suddenly damaged (acute kidney injury).  SYMPTOMS   Swelling (edema) of the legs, ankles, or feet.   Tiredness (lethargy).   Nausea or vomiting.   Confusion.   Problems with urination, such as:   Decreased urine production.   Frequent urination, especially at night.   Frequent accidents in children who are potty trained.   Muscle twitches and cramps.   Persistent itchiness.   Loss of appetite.   Headaches.   Abnormally dark or light skin.   Numbness in the hands or feet.   Easy bruising.   Frequent hiccups.   Menstruation stops. DIAGNOSIS  Your health care provider will measure your blood pressure and take some tests. These may include:   Urine tests.   Blood tests.   Imaging tests, such as:   An ultrasound exam.   Computed tomography (CT).  A kidney biopsy. TREATMENT  There are two treatments for end-stage kidney disease:   A procedure that removes toxic wastes from the body (dialysis).   Receiving a new kidney  (kidney transplant). Both of these treatments have serious risks and consequences. Your health care provider will help you determine which treatment is best for you based on your health, age, and other factors. In addition to having dialysis or a kidney transplant, you may need to take medicines to control high blood pressure (hypertension) and cholesterol and to decrease phosphorus levels in your blood.  HOME CARE INSTRUCTIONS  Follow your prescribed diet.   Take medicines only as directed by your health care provider.   Do not take any new medicines (prescription, over-the-counter, or nutritional supplements) unless approved by your health care provider. Many medicines can worsen your kidney damage or need to have the dose adjusted.   Keep all follow-up visits as directed by your health care provider. MAKE SURE YOU:  Understand these instructions.  Will watch your condition.  Will get help right away if you are not doing well or get worse.   This information is not intended to replace advice given to you by your health care provider. Make sure you discuss any questions you have with your health care provider.   Document Released: 12/30/2003 Document Revised: 10/30/2014 Document Reviewed: 06/07/2012 Elsevier Interactive Patient Education 2016 Elsevier Inc.  

## 2016-02-29 NOTE — Progress Notes (Signed)
Patient is here for FU  Patient complains of left knee and pelvic pain. Pain is scaled currently at a 4.  Patient is requesting refills on medications.  Patient did not take medication today and patient has not eaten.

## 2016-02-29 NOTE — Progress Notes (Signed)
Patient ID: [name] Small, female   DOB: 02/26/1991, 25 y.o.   MRN: [id number]   [name] Small, is a 25 y.o. female  FUX:[id number]  [id number]  DOB - 07-May-1991  Chief Complaint  Patient presents with  . Follow-up        Subjective:   [name] Small is a 25 y.o. female with extensive medical history including end-stage renal disease on hemodialysis, pulmonary embolism, congestive heart failure status post AICD placement with low ejection fraction of 20-25% in January 2017, chronic pain syndrome and hypertension here today for a follow up visit. Patient now has a regular dialysis center and she has been compliant with dialysis, she also now lives alone on her own, a little more stable than the the last encounter. She needs refills on some of her medications today. She was at some point on Zoloft but patient claims she stopped because she does not think she needs to be on antidepressant. She denies suicidal ideations but she claimed she had thoughts of suicide in the past, although never had a plan. She feels much better now and her mood is improved. She continues to have pelvic pains from her osteoporosis and pathological fractures. Patient has No headache, No chest pain, No abdominal pain - No Nausea, No new weakness tingling or numbness, No Cough - SOB.  Problem  Chronic Pain Syndrome    ALLERGIES: Allergies  Allergen Reactions  . Tramadol Anaphylaxis  . Vicodin [Hydrocodone-Acetaminophen] Hives  . Buprenorphine Hcl Itching    Ok with oxycodone  . Iohexol Itching  . Morphine And Related Itching    Ok with oxycodone    PAST MEDICAL HISTORY: Past Medical History  Diagnosis Date  . Hemodialysis patient (HCC)   . Hypertension   . Pulmonary emboli (HCC) 01/2012    Bilateral, moderate clot burden, areas of pulmonary infarction and central necrosis  . CHF (congestive heart failure) (HCC)   . Cardiomyopathy   . Dysrhythmia     at times per pt.  . Anemia   . H/O  transfusion of packed red blood cells   . 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 Polycystic kidney disease   . Cellulitis and abscess of face 03/22/2013  . Renal insufficiency   . Chronic pain   . GERD (gastroesophageal reflux disease)   . Depression   . Hyperkalemia 09/2015  . Pelvic fracture (HCC)   . Osteoporosis   . Sickle cell anemia (HCC)   . Narcotic abuse, continuous     MEDICATIONS AT HOME: Prior to Admission medications   Medication Sig Start Date End Date Taking? Authorizing Provider  amLODipine (NORVASC) 10 MG tablet Take 1 tablet (10 mg total) by mouth daily. 01/04/16  Yes Quentin Angst, MD  calcium carbonate, dosed in mg elemental calcium, 1250 (500 Ca) MG/5ML Take 5 mLs (500 mg of elemental calcium total) by mouth every 6 (six) hours as needed for indigestion. 01/11/16  Yes Rhetta Mura, MD  carvedilol (COREG) 6.25 MG tablet Take 1 tablet (6.25 mg total) by mouth 2 (two) times daily. 01/04/16  Yes Quentin Angst, MD  cinacalcet (SENSIPAR) 30 MG tablet Take 1 tablet (30 mg total) by mouth daily with breakfast. 01/04/16  Yes Colburn Asper Annitta Needs, MD  isosorbide dinitrate (ISORDIL) 10 MG tablet Take 1 tablet (10 mg total) by mouth 3 (three) times daily. 02/01/16  Yes Quentin Angst, MD  LORazepam (  ATIVAN) 1 MG tablet Take 1 tablet (1 mg total) by mouth daily as needed for anxiety. 02/29/16  Yes Quentin Angst, MD  multivitamin (RENA-VIT) TABS tablet Take 1 tablet by mouth daily. 01/04/16  Yes Tangy Drozdowski Annitta Needs, MD  oxyCODONE (ROXICODONE) 5 MG immediate release tablet Take 1 tablet (5 mg total) by mouth every 4 (four) hours as needed for severe pain. 02/29/16  Yes Quentin Angst, MD  sevelamer carbonate (RENVELA) 800 MG tablet Take 2-3 tablets (1,600-2,400 mg total) by mouth 3 (three) times daily with meals. 3 tabs with meals, 2 tabs with snacks 01/04/16  01/03/17 Yes Tara Rud Annitta Needs, MD  diclofenac sodium (VOLTAREN) 1 % GEL Apply 2 g topically 4 (four) times daily. 02/29/16   Quentin Angst, MD  sertraline (ZOLOFT) 100 MG tablet  01/04/16   Historical Provider, MD     Objective:   Filed Vitals:   02/29/16 1122  BP: 123/91  Pulse: 91  Temp: 98.3 F (36.8 C)  TempSrc: Oral  Resp: 18  Height:  (1.6 m)  Weight: 120 lb (54.432 kg)  SpO2: 100%    Exam General appearance : Awake, alert, not in any distress. Speech Clear. Not toxic looking HEENT: Atraumatic and Normocephalic, pupils equally reactive to light and accomodation Neck: supple, no JVD. No cervical lymphadenopathy.  Chest:Good air entry bilaterally, no added sounds  CVS: S1 S2 regular, no murmurs.  Abdomen: Bowel sounds present, Non tender and not distended with no gaurding, rigidity or rebound. Extremities: B/L Lower Ext shows no edema, both legs are warm to touch Neurology: Awake alert, and oriented X 3, CN II-XII intact, Non focal Skin: No Rash  Data Review Lab Results  Component Value Date   HGBA1C 5.8* 01/10/2016   HGBA1C 5.8* 04/03/2015   HGBA1C 5.7* 01/26/2015     Assessment & Plan   1. ESRD on dialysis (HCC) Refill - LORazepam (ATIVAN) 1 MG tablet; Take 1 tablet (1 mg total) by mouth daily as needed for anxiety.  Dispense: 30 tablet; Refill: 0  2. Chronic pain syndrome Refill - oxyCODONE (ROXICODONE) 5 MG immediate release tablet; Take 1 tablet (5 mg total) by mouth every 4 (four) hours as needed for severe pain.  Dispense: 90 tablet; Refill: 0  3. Chronic systolic heart failure (HCC)S/P AICD placement  - Ambulatory referral to Cardiology  Patient have been counseled extensively about nutrition and exercise  Return in about 2 months (around 04/30/2016) for CKD/ESRD, Follow up HTN.  The patient was given clear instructions to go to ER or return to medical center if symptoms don't improve, worsen or new problems develop. The patient  verbalized understanding. The patient was told to call to get lab results if they haven't heard anything in the next week.   This note has been created with Education officer, environmental. Any transcriptional errors are unintentional.    Jeanann Lewandowsky, MD, MHA, FACP, FAAP, CPE [facility] [place], [place] [phone]   02/29/2016, 12:03 PM

## 2016-03-06 ENCOUNTER — Emergency Department (HOSPITAL_BASED_OUTPATIENT_CLINIC_OR_DEPARTMENT_OTHER): Payer: Medicare Other

## 2016-03-06 ENCOUNTER — Other Ambulatory Visit: Payer: Self-pay

## 2016-03-06 ENCOUNTER — Emergency Department (HOSPITAL_BASED_OUTPATIENT_CLINIC_OR_DEPARTMENT_OTHER)
Admission: EM | Admit: 2016-03-06 | Discharge: 2016-03-06 | Disposition: A | Discharge status: Home / Self Care | Payer: Medicare Other | Attending: Emergency Medicine | Admitting: Emergency Medicine

## 2016-03-06 ENCOUNTER — Encounter (HOSPITAL_BASED_OUTPATIENT_CLINIC_OR_DEPARTMENT_OTHER): Payer: Self-pay | Admitting: *Deleted

## 2016-03-06 DIAGNOSIS — Z87891 Personal history of nicotine dependence: Secondary | ICD-10-CM | POA: Insufficient documentation

## 2016-03-06 DIAGNOSIS — Z79899 Other long term (current) drug therapy: Secondary | ICD-10-CM | POA: Diagnosis not present

## 2016-03-06 DIAGNOSIS — I2699 Other pulmonary embolism without acute cor pulmonale: Secondary | ICD-10-CM | POA: Diagnosis not present

## 2016-03-06 DIAGNOSIS — R0789 Other chest pain: Secondary | ICD-10-CM | POA: Diagnosis present

## 2016-03-06 DIAGNOSIS — G894 Chronic pain syndrome: Secondary | ICD-10-CM

## 2016-03-06 DIAGNOSIS — I509 Heart failure, unspecified: Secondary | ICD-10-CM | POA: Diagnosis not present

## 2016-03-06 DIAGNOSIS — M810 Age-related osteoporosis without current pathological fracture: Secondary | ICD-10-CM | POA: Diagnosis not present

## 2016-03-06 DIAGNOSIS — Z992 Dependence on renal dialysis: Secondary | ICD-10-CM | POA: Diagnosis not present

## 2016-03-06 DIAGNOSIS — I132 Hypertensive heart and chronic kidney disease with heart failure and with stage 5 chronic kidney disease, or end stage renal disease: Secondary | ICD-10-CM | POA: Insufficient documentation

## 2016-03-06 DIAGNOSIS — R220 Localized swelling, mass and lump, head: Secondary | ICD-10-CM | POA: Diagnosis not present

## 2016-03-06 DIAGNOSIS — F329 Major depressive disorder, single episode, unspecified: Secondary | ICD-10-CM | POA: Insufficient documentation

## 2016-03-06 DIAGNOSIS — N186 End stage renal disease: Secondary | ICD-10-CM | POA: Insufficient documentation

## 2016-03-06 LAB — CBC WITH DIFFERENTIAL/PLATELET
BASOS ABS: 0 10*3/uL (ref 0.0–0.1)
Basophils Relative: 0 %
Eosinophils Absolute: 0 10*3/uL (ref 0.0–0.7)
Eosinophils Relative: 1 %
HCT: 36.7 % (ref 36.0–46.0)
HEMOGLOBIN: 12 g/dL (ref 12.0–15.0)
LYMPHS ABS: 1.8 10*3/uL (ref 0.7–4.0)
LYMPHS PCT: 29 %
MCH: 29.6 pg (ref 26.0–34.0)
MCHC: 32.7 g/dL (ref 30.0–36.0)
MCV: 90.6 fL (ref 78.0–100.0)
Monocytes Absolute: 0.3 10*3/uL (ref 0.1–1.0)
Monocytes Relative: 5 %
NEUTROS PCT: 65 %
Neutro Abs: 4.1 10*3/uL (ref 1.7–7.7)
PLATELETS: 211 10*3/uL (ref 150–400)
RBC: 4.05 MIL/uL (ref 3.87–5.11)
RDW: 18.3 % — ABNORMAL HIGH (ref 11.5–15.5)
WBC: 6.3 10*3/uL (ref 4.0–10.5)

## 2016-03-06 LAB — TROPONIN I: TROPONIN I: 0.15 ng/mL — AB (ref <0.031)

## 2016-03-06 LAB — BASIC METABOLIC PANEL
ANION GAP: 20 — AB (ref 5–15)
BUN: 76 mg/dL — ABNORMAL HIGH (ref 6–20)
CHLORIDE: 96 mmol/L — AB (ref 101–111)
CO2: 22 mmol/L (ref 22–32)
Calcium: 10.2 mg/dL (ref 8.9–10.3)
Creatinine, Ser: 12.82 mg/dL — ABNORMAL HIGH (ref 0.44–1.00)
GFR calc non Af Amer: 4 mL/min — ABNORMAL LOW (ref >60)
GFR, EST AFRICAN AMERICAN: 4 mL/min — AB (ref >60)
Glucose, Bld: 86 mg/dL (ref 65–99)
POTASSIUM: 5.1 mmol/L (ref 3.5–5.1)
SODIUM: 138 mmol/L (ref 135–145)

## 2016-03-06 MED ORDER — DIPHENHYDRAMINE HCL 25 MG PO CAPS
25.0000 mg | ORAL_CAPSULE | Freq: Once | ORAL | Status: AC
  Administered 2016-03-06: 25 mg via ORAL
  Filled 2016-03-06: qty 1

## 2016-03-06 MED ORDER — CYCLOBENZAPRINE HCL 10 MG PO TABS
10.0000 mg | ORAL_TABLET | Freq: Once | ORAL | Status: AC
Start: 2016-03-06 — End: 2016-03-06
  Administered 2016-03-06: 10 mg via ORAL
  Filled 2016-03-06: qty 1

## 2016-03-06 MED ORDER — OXYCODONE-ACETAMINOPHEN 5-325 MG PO TABS: 2.0000 | ORAL_TABLET | Freq: Once | ORAL | Status: DC

## 2016-03-06 NOTE — ED Notes (Addendum)
Pt requested to walk back out to the waiting room instead of using the wheelchair she was sitting in. Pt ambulated back out to the waiting room with a normal steady gait.

## 2016-03-06 NOTE — Discharge Instructions (Signed)
Please call your dialysis Center to set up an appointment for dialysis tomorrow.  Please follow with your primary care doctor in the next 2 days for a check-up. They must obtain records for further management.   Do not hesitate to return to the Emergency Department for any new, worsening or concerning symptoms.   Chronic Pain Chronic pain can be defined as pain that is off and on and lasts for 3-6 months or longer. Many things cause chronic pain, which can make it difficult to make a diagnosis. There are many treatment options available for chronic pain. However, finding a treatment that works well for you may require trying various approaches until the right one is found. Many people benefit from a combination of two or more types of treatment to control their pain. SYMPTOMS  Chronic pain can occur anywhere in the body and can range from mild to very severe. Some types of chronic pain include:  Headache.  Low back pain.  Cancer pain.  Arthritis pain.  Neurogenic pain. This is pain resulting from damage to nerves. People with chronic pain may also have other symptoms such as:  Depression.  Anger.  Insomnia.  Anxiety. DIAGNOSIS  Your health care provider will help diagnose your condition over time. In many cases, the initial focus will be on excluding possible conditions that could be causing the pain. Depending on your symptoms, your health care provider may order tests to diagnose your condition. Some of these tests may include:   Blood tests.   CT scan.   MRI.   X-rays.   Ultrasounds.   Nerve conduction studies.  You may need to see a specialist.  TREATMENT  Finding treatment that works well may take time. You may be referred to a pain specialist. He or she may prescribe medicine or therapies, such as:   Mindful meditation or yoga.  Shots (injections) of numbing or pain-relieving medicines into the spine or area of pain.  Local electrical  stimulation.  Acupuncture.   Massage therapy.   Aroma, color, light, or sound therapy.   Biofeedback.   Working with a physical therapist to keep from getting stiff.   Regular, gentle exercise.   Cognitive or behavioral therapy.   Group support.  Sometimes, surgery may be recommended.  HOME CARE INSTRUCTIONS   Take all medicines as directed by your health care provider.   Lessen stress in your life by relaxing and doing things such as listening to calming music.   Exercise or be active as directed by your health care provider.   Eat a healthy diet and include things such as vegetables, fruits, fish, and lean meats in your diet.   Keep all follow-up appointments with your health care provider.   Attend a support group with others suffering from chronic pain. SEEK MEDICAL CARE IF:   Your pain gets worse.   You develop a new pain that was not there before.   You cannot tolerate medicines given to you by your health care provider.   You have new symptoms since your last visit with your health care provider.  SEEK IMMEDIATE MEDICAL CARE IF:   You feel weak.   You have decreased sensation or numbness.   You lose control of bowel or bladder function.   Your pain suddenly gets much worse.   You develop shaking.  You develop chills.  You develop confusion.  You develop chest pain.  You develop shortness of breath.  MAKE SURE YOU:  Understand these instructions.  Will watch your condition.  Will get help right away if you are not doing well or get worse.   This information is not intended to replace advice given to you by your health care provider. Make sure you discuss any questions you have with your health care provider.   Document Released: 07/01/2002 Document Revised: 06/11/2013 Document Reviewed: 04/04/2013 Elsevier Interactive Patient Education [employer]

## 2016-03-06 NOTE — ED Notes (Signed)
Pt c/o chest pain x 1 day, HX chronic chest pain , also states no dialysis today d/t transportation

## 2016-03-06 NOTE — ED Notes (Signed)
C/o cp x 2 days missed dialysis  States usually has cp when misses dialysis

## 2016-03-06 NOTE — ED Provider Notes (Signed)
CSN: [id number]     Arrival date & time 03/06/16  1921 History   First MD Initiated Contact with Patient 03/06/16 2141     Chief Complaint  Patient presents with  . Chest Pain     (Consider location/radiation/quality/duration/timing/severity/associated sxs/prior Treatment) HPI   Blood pressure 154/100, pulse 99, temperature 99 F (37.2 C), temperature source Oral, resp. rate 16, weight 53 kg, SpO2 100 %.  [name] Small is a 25 y.o. female with past medical history significant for ESRD, PE, chronic chest pain, narcotic abuse complaining of exacerbation of her chronic left-sided chest pain onset 5 days ago, rated at 10 out of 10, patient's been taking Percocet at home with no relief. States she has missed dialysis for 2 sessions because the pain was severe and because she couldn't pay for transport. States that the pain is left-sided, nonradiating, not associated with cough or shortness of breath, states that it is typical for her chronic chest pain. Denies increasing peripheral edema, calf pain, swelling, recent immobilizations.  Past Medical History  Diagnosis Date  . Hemodialysis patient (HCC)   . Hypertension   . Pulmonary emboli (HCC) 01/2012    Bilateral, moderate clot burden, areas of pulmonary infarction and central necrosis  . CHF (congestive heart failure) (HCC)   . Cardiomyopathy   . Dysrhythmia     at times per pt.  . Anemia   . H/O transfusion of packed red blood cells   . 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 Polycystic kidney disease   . Cellulitis and abscess of face 03/22/2013  . Renal insufficiency   . Chronic pain   . GERD (gastroesophageal reflux disease)   . Depression   . Hyperkalemia 09/2015  . Pelvic fracture (HCC)   . Osteoporosis   . Sickle cell anemia (HCC)   . Narcotic abuse, continuous    Past Surgical History  Procedure Laterality Date  .  Nephrectomy    . Av fistula placement    . Kidney transplant  2008    failed  . Tonsillectomy      as a child.  . Adenoidectomy    . Incision and drainage abscess Right 03/21/2013    Procedure: INCISION AND DRAINAGE RIGHT CHEEK ABSCESS REMOVAL OF FOREIGN BODY;  Surgeon: Serena Colonel, MD;  Location: [facility] OR;  Service: ENT;  Laterality: Right;  . Implantable cardioverter defibrillator implant Right 12/2014   Family History  Problem Relation Age of Onset  . Polycystic kidney disease Father    Social History  Substance Use Topics  . Smoking status: Former Smoker -- 0.00 packs/day for 1 years    Types: Cigarettes    Quit date: 12/08/2015  . Smokeless tobacco: Never Used  . Alcohol Use: No   OB History    No data available     Review of Systems  10 systems reviewed and found to be negative, except as noted in the HPI.   Allergies  Tramadol; Vicodin; Buprenorphine hcl; Iohexol; and Morphine and related  Home Medications   Prior to Admission medications   Medication Sig Start Date End Date Taking? Authorizing Provider  amLODipine (NORVASC) 10 MG tablet Take 1 tablet (10 mg total) by mouth daily. 01/04/16   Quentin Angst, MD  calcium carbonate, dosed in mg elemental calcium, 1250 (500 Ca) MG/5ML Take 5 mLs (500 mg of elemental calcium total) by mouth every 6 (six) hours  as needed for indigestion. 01/11/16   Rhetta Mura, MD  carvedilol (COREG) 6.25 MG tablet Take 1 tablet (6.25 mg total) by mouth 2 (two) times daily. 01/04/16   Quentin Angst, MD  cinacalcet (SENSIPAR) 30 MG tablet Take 1 tablet (30 mg total) by mouth daily with breakfast. 01/04/16   Quentin Angst, MD  diclofenac sodium (VOLTAREN) 1 % GEL Apply 2 g topically 4 (four) times daily. 02/29/16   Quentin Angst, MD  isosorbide dinitrate (ISORDIL) 10 MG tablet Take 1 tablet (10 mg total) by mouth 3 (three) times daily. 02/01/16   Olugbemiga Annitta Needs, MD  LORazepam (ATIVAN) 1 MG tablet Take 1 tablet (1  mg total) by mouth daily as needed for anxiety. 02/29/16   Quentin Angst, MD  multivitamin (RENA-VIT) TABS tablet Take 1 tablet by mouth daily. 01/04/16   Olugbemiga Annitta Needs, MD  oxyCODONE (ROXICODONE) 5 MG immediate release tablet Take 1 tablet (5 mg total) by mouth every 4 (four) hours as needed for severe pain. 02/29/16   Quentin Angst, MD  sertraline (ZOLOFT) 100 MG tablet  01/04/16   Historical Provider, MD  sevelamer carbonate (RENVELA) 800 MG tablet Take 2-3 tablets (1,600-2,400 mg total) by mouth 3 (three) times daily with meals. 3 tabs with meals, 2 tabs with snacks 01/04/16 01/03/17  Olugbemiga E Jegede, MD   BP 154/100 mmHg  Pulse 99  Temp(Src) 99 F (37.2 C) (Oral)  Resp 16  Wt 53 kg  SpO2 100% Physical Exam  Constitutional: She is oriented to person, place, and time. She appears well-developed and well-nourished. No distress.  HENT:  Head: Normocephalic.  Mouth/Throat: Oropharynx is clear and moist.  Bilateral periorbital facial edema  Eyes: Conjunctivae and EOM are normal.  Neck: Normal range of motion. No JVD present. No tracheal deviation present.  Cardiovascular: Normal rate, regular rhythm and intact distal pulses.   Fistula to left antecubital area with good thrill.  Pulmonary/Chest: Effort normal and breath sounds normal. No stridor. No respiratory distress. She has no wheezes. She has no rales. She exhibits no tenderness.  Abdominal: Soft. Bowel sounds are normal. She exhibits no distension and no mass. There is no tenderness. There is no rebound and no guarding.  Musculoskeletal: Normal range of motion. She exhibits no edema or tenderness.  No calf asymmetry, superficial collaterals, palpable cords, edema, Homans sign negative bilaterally.    Neurological: She is alert and oriented to person, place, and time.  Skin: Skin is warm. She is not diaphoretic.  Psychiatric: She has a normal mood and affect.  Nursing note and vitals reviewed.   ED Course   Procedures (including critical care time) Labs Review Labs Reviewed  BASIC METABOLIC PANEL - Abnormal; Notable for the following:    Chloride 96 (*)    BUN 76 (*)    Creatinine, Ser 12.82 (*)    GFR calc non Af Amer 4 (*)    GFR calc Af Amer 4 (*)    Anion gap 20 (*)    All other components within normal limits  TROPONIN I - Abnormal; Notable for the following:    Troponin I 0.15 (*)    All other components within normal limits  CBC WITH DIFFERENTIAL/PLATELET - Abnormal; Notable for the following:    RDW 18.3 (*)    All other components within normal limits    Imaging Review Dg Chest 2 View  03/06/2016  CLINICAL DATA:  Chest pain that began yesterday, sharp pain, history end-stage  renal disease on dialysis, pulmonary embolism, CHF, cardiomyopathy, sickle cell anemia, former smoker, failed renal transplant EXAM: CHEST  2 VIEW COMPARISON:  02/20/2016 FINDINGS: RIGHT subclavian AICD lead projects at RIGHT ventricle. Significant enlargement of cardiac silhouette with pulmonary vascular congestion. Mediastinal contours normal. Lungs hyperinflated but clear. No pleural effusion or pneumothorax. No acute osseous findings. IMPRESSION: Enlargement of cardiac silhouette with pulmonary vascular congestion post AICD. Hyperinflated lungs without infiltrate. Electronically Signed   By: Ulyses Southward M.D.   On: 03/06/2016 21:41   I have personally reviewed and evaluated these images and lab results as part of my medical decision-making.   EKG Interpretation   Date/Time:  Monday Mar 06 2016 19:48:52 EDT Ventricular Rate:  107 PR Interval:    QRS Duration: 92 QT Interval:  424 QTC Calculation: 566 R Axis:   92 Text Interpretation:  ** Critical Test Result: Long QTc Accelerated  Junctional rhythm with retrograde conduction Rightward axis T wave  abnormality, consider lateral ischemia Prolonged QT Abnormal ECG no  significant change since February 20 2016 Confirmed by Criss Alvine MD, SCOTT  [phone]) on  03/06/2016 8:39:47 PM      MDM   Final diagnoses:  None    Filed Vitals:   03/06/16 1945  BP: 154/100  Pulse: 99  Temp: 99 F (37.2 C)  TempSrc: Oral  Resp: 16  Weight: 53 kg  SpO2: 100%    Medications  diphenhydrAMINE (BENADRYL) capsule 25 mg (not administered)  cyclobenzaprine (FLEXERIL) tablet 10 mg (not administered)    [name] Small is 25 y.o. female presenting with Exacerbation of chronic chest pain, has missed 2 dialysis sessions. EKG with no acute changes, troponin is elevated at 0.15, this is lower than her typical baseline. Potassium normal, chest x-ray without significant abnormality. Physical exam is not consistent with DVT, I doubt this is PE. Patient encouraged to follow closely for dialysis.  Evaluation does not show pathology that would require ongoing emergent intervention or inpatient treatment. Pt is hemodynamically stable and mentating appropriately. Discussed findings and plan with patient/guardian, who agrees with care plan. All questions answered. Return precautions discussed and outpatient follow up given.       Wynetta Emery, PA-C 03/06/16 2224  Pricilla Loveless, MD 03/08/16 0010

## 2016-03-09 ENCOUNTER — Ambulatory Visit
Admission: RE | Admit: 2016-03-09 | Discharge: 2016-03-09 | Disposition: A | Discharge status: Home / Self Care | Payer: Medicare Other | Source: Ambulatory Visit | Attending: Vascular Surgery | Admitting: Vascular Surgery

## 2016-03-09 ENCOUNTER — Emergency Department (HOSPITAL_BASED_OUTPATIENT_CLINIC_OR_DEPARTMENT_OTHER)
Admission: EM | Admit: 2016-03-09 | Discharge: 2016-03-09 | Disposition: A | Discharge status: Home / Self Care | Payer: Medicare Other | Attending: Emergency Medicine | Admitting: Emergency Medicine

## 2016-03-09 ENCOUNTER — Encounter
Admission: RE | Disposition: A | Discharge status: Home / Self Care | Payer: Self-pay | Source: Ambulatory Visit | Attending: Vascular Surgery

## 2016-03-09 ENCOUNTER — Encounter (HOSPITAL_BASED_OUTPATIENT_CLINIC_OR_DEPARTMENT_OTHER): Payer: Self-pay | Admitting: *Deleted

## 2016-03-09 DIAGNOSIS — E875 Hyperkalemia: Secondary | ICD-10-CM | POA: Diagnosis not present

## 2016-03-09 DIAGNOSIS — I499 Cardiac arrhythmia, unspecified: Secondary | ICD-10-CM | POA: Diagnosis not present

## 2016-03-09 DIAGNOSIS — N186 End stage renal disease: Secondary | ICD-10-CM | POA: Diagnosis not present

## 2016-03-09 DIAGNOSIS — Z87891 Personal history of nicotine dependence: Secondary | ICD-10-CM | POA: Insufficient documentation

## 2016-03-09 DIAGNOSIS — Z79899 Other long term (current) drug therapy: Secondary | ICD-10-CM | POA: Insufficient documentation

## 2016-03-09 DIAGNOSIS — I509 Heart failure, unspecified: Secondary | ICD-10-CM | POA: Diagnosis not present

## 2016-03-09 DIAGNOSIS — I132 Hypertensive heart and chronic kidney disease with heart failure and with stage 5 chronic kidney disease, or end stage renal disease: Secondary | ICD-10-CM | POA: Insufficient documentation

## 2016-03-09 DIAGNOSIS — T82858A Stenosis of vascular prosthetic devices, implants and grafts, initial encounter: Secondary | ICD-10-CM | POA: Insufficient documentation

## 2016-03-09 DIAGNOSIS — Y712 Prosthetic and other implants, materials and accessory cardiovascular devices associated with adverse incidents: Secondary | ICD-10-CM | POA: Insufficient documentation

## 2016-03-09 DIAGNOSIS — G8929 Other chronic pain: Secondary | ICD-10-CM | POA: Diagnosis not present

## 2016-03-09 DIAGNOSIS — Y832 Surgical operation with anastomosis, bypass or graft as the cause of abnormal reaction of the patient, or of later complication, without mention of misadventure at the time of the procedure: Secondary | ICD-10-CM | POA: Insufficient documentation

## 2016-03-09 DIAGNOSIS — K219 Gastro-esophageal reflux disease without esophagitis: Secondary | ICD-10-CM | POA: Insufficient documentation

## 2016-03-09 DIAGNOSIS — T82838A Hemorrhage of vascular prosthetic devices, implants and grafts, initial encounter: Secondary | ICD-10-CM

## 2016-03-09 DIAGNOSIS — Z86711 Personal history of pulmonary embolism: Secondary | ICD-10-CM | POA: Insufficient documentation

## 2016-03-09 DIAGNOSIS — Z992 Dependence on renal dialysis: Secondary | ICD-10-CM | POA: Diagnosis not present

## 2016-03-09 DIAGNOSIS — F329 Major depressive disorder, single episode, unspecified: Secondary | ICD-10-CM | POA: Insufficient documentation

## 2016-03-09 DIAGNOSIS — I429 Cardiomyopathy, unspecified: Secondary | ICD-10-CM | POA: Diagnosis not present

## 2016-03-09 DIAGNOSIS — M810 Age-related osteoporosis without current pathological fracture: Secondary | ICD-10-CM | POA: Diagnosis not present

## 2016-03-09 DIAGNOSIS — Z940 Kidney transplant status: Secondary | ICD-10-CM | POA: Insufficient documentation

## 2016-03-09 DIAGNOSIS — Q613 Polycystic kidney, unspecified: Secondary | ICD-10-CM | POA: Insufficient documentation

## 2016-03-09 DIAGNOSIS — Z905 Acquired absence of kidney: Secondary | ICD-10-CM | POA: Insufficient documentation

## 2016-03-09 DIAGNOSIS — D571 Sickle-cell disease without crisis: Secondary | ICD-10-CM | POA: Insufficient documentation

## 2016-03-09 HISTORY — PX: INSERTION OF DIALYSIS CATHETER: SHX1324

## 2016-03-09 HISTORY — PX: PERIPHERAL VASCULAR CATHETERIZATION: SHX172C

## 2016-03-09 LAB — POTASSIUM (ARMC VASCULAR LAB ONLY): POTASSIUM (ARMC VASCULAR LAB): 4.7 (ref 3.5–5.1)

## 2016-03-09 SURGERY — A/V SHUNTOGRAM/FISTULAGRAM
Anesthesia: Moderate Sedation | Site: Arm Upper | Laterality: Left

## 2016-03-09 MED ORDER — DIPHENHYDRAMINE HCL 50 MG/ML IJ SOLN
INTRAMUSCULAR | Status: DC | PRN
  Administered 2016-03-09: 50 mg via INTRAVENOUS

## 2016-03-09 MED ORDER — FAMOTIDINE 20 MG PO TABS: 40.0000 mg | ORAL_TABLET | ORAL | Status: DC | PRN

## 2016-03-09 MED ORDER — DIPHENHYDRAMINE HCL 50 MG/ML IJ SOLN
INTRAMUSCULAR | Status: AC
  Filled 2016-03-09: qty 1

## 2016-03-09 MED ORDER — ONDANSETRON HCL 4 MG/2ML IJ SOLN: 4.0000 mg | Freq: Four times a day (QID) | INTRAMUSCULAR | Status: DC | PRN

## 2016-03-09 MED ORDER — METOPROLOL TARTRATE 5 MG/5ML IV SOLN: 2.0000 mg | INTRAVENOUS | Status: DC | PRN

## 2016-03-09 MED ORDER — HYDRALAZINE HCL 20 MG/ML IJ SOLN: 5.0000 mg | INTRAMUSCULAR | Status: DC | PRN

## 2016-03-09 MED ORDER — ACETAMINOPHEN 325 MG PO TABS: 325.0000 mg | ORAL_TABLET | ORAL | Status: DC | PRN

## 2016-03-09 MED ORDER — OXYCODONE-ACETAMINOPHEN 5-325 MG PO TABS
1.0000 | ORAL_TABLET | ORAL | Status: DC | PRN
  Administered 2016-03-09: 1 via ORAL

## 2016-03-09 MED ORDER — HEPARIN SODIUM (PORCINE) 10000 UNIT/ML IJ SOLN
INTRAMUSCULAR | Status: AC
  Filled 2016-03-09: qty 1

## 2016-03-09 MED ORDER — ACETAMINOPHEN 325 MG RE SUPP
325.0000 mg | RECTAL | Status: DC | PRN
  Filled 2016-03-09: qty 2

## 2016-03-09 MED ORDER — FENTANYL CITRATE (PF) 100 MCG/2ML IJ SOLN
INTRAMUSCULAR | Status: AC
  Filled 2016-03-09: qty 2

## 2016-03-09 MED ORDER — AMLODIPINE BESYLATE 5 MG PO TABS
10.0000 mg | ORAL_TABLET | Freq: Every day | ORAL | Status: DC
  Administered 2016-03-09: 10 mg via ORAL

## 2016-03-09 MED ORDER — FENTANYL CITRATE (PF) 100 MCG/2ML IJ SOLN
INTRAMUSCULAR | Status: DC | PRN
  Administered 2016-03-09 (×6): 50 ug via INTRAVENOUS

## 2016-03-09 MED ORDER — MIDAZOLAM HCL 5 MG/5ML IJ SOLN
INTRAMUSCULAR | Status: AC
  Filled 2016-03-09: qty 5

## 2016-03-09 MED ORDER — SODIUM CHLORIDE 0.9 % IV SOLN: 500.0000 mL | Freq: Once | INTRAVENOUS | Status: DC | PRN

## 2016-03-09 MED ORDER — HEPARIN SODIUM (PORCINE) 1000 UNIT/ML IJ SOLN
INTRAMUSCULAR | Status: DC | PRN
  Administered 2016-03-09: 3000 [IU] via INTRAVENOUS

## 2016-03-09 MED ORDER — MIDAZOLAM HCL 2 MG/2ML IJ SOLN
INTRAMUSCULAR | Status: DC | PRN
  Administered 2016-03-09: 1 mg via INTRAVENOUS
  Administered 2016-03-09: 2 mg via INTRAVENOUS
  Administered 2016-03-09 (×2): 1 mg via INTRAVENOUS

## 2016-03-09 MED ORDER — HEPARIN SODIUM (PORCINE) 1000 UNIT/ML IJ SOLN
INTRAMUSCULAR | Status: AC
  Filled 2016-03-09: qty 1

## 2016-03-09 MED ORDER — GUAIFENESIN-DM 100-10 MG/5ML PO SYRP: 15.0000 mL | ORAL_SOLUTION | ORAL | Status: DC | PRN

## 2016-03-09 MED ORDER — METHYLPREDNISOLONE SODIUM SUCC 125 MG IJ SOLR: 125.0000 mg | INTRAMUSCULAR | Status: DC | PRN

## 2016-03-09 MED ORDER — LIDOCAINE-EPINEPHRINE (PF) 1 %-1:200000 IJ SOLN
INTRAMUSCULAR | Status: AC
  Filled 2016-03-09: qty 30

## 2016-03-09 MED ORDER — HEPARIN (PORCINE) IN NACL 2-0.9 UNIT/ML-% IJ SOLN
INTRAMUSCULAR | Status: AC
  Filled 2016-03-09: qty 1000

## 2016-03-09 MED ORDER — SODIUM CHLORIDE 0.9 % IV SOLN
INTRAVENOUS | Status: DC
  Administered 2016-03-09 (×2): via INTRAVENOUS

## 2016-03-09 MED ORDER — OXYCODONE HCL 5 MG PO TABS: 10.0000 mg | ORAL_TABLET | Freq: Once | ORAL | Status: DC

## 2016-03-09 MED ORDER — ONDANSETRON HCL 4 MG/2ML IJ SOLN
4.0000 mg | Freq: Four times a day (QID) | INTRAMUSCULAR | Status: DC | PRN
Start: 2016-03-09 — End: 2016-03-09

## 2016-03-09 MED ORDER — LABETALOL HCL 5 MG/ML IV SOLN: 10.0000 mg | INTRAVENOUS | Status: DC | PRN

## 2016-03-09 MED ORDER — OXYCODONE-ACETAMINOPHEN 5-325 MG PO TABS
ORAL_TABLET | ORAL | Status: DC
Start: 2016-03-09 — End: 2016-03-09
  Filled 2016-03-09: qty 1

## 2016-03-09 MED ORDER — MORPHINE SULFATE (PF) 4 MG/ML IV SOLN: 2.0000 mg | INTRAVENOUS | Status: DC | PRN

## 2016-03-09 MED ORDER — LIDOCAINE-EPINEPHRINE (PF) 1 %-1:200000 IJ SOLN
INTRAMUSCULAR | Status: DC | PRN
  Administered 2016-03-09: 20 mL via INTRADERMAL
  Administered 2016-03-09: 10 mL via INTRADERMAL

## 2016-03-09 MED ORDER — ALUM & MAG HYDROXIDE-SIMETH 200-200-20 MG/5ML PO SUSP: 15.0000 mL | ORAL | Status: DC | PRN

## 2016-03-09 MED ORDER — IOPAMIDOL (ISOVUE-300) INJECTION 61%
INTRAVENOUS | Status: DC | PRN
  Administered 2016-03-09: 30 mL via INTRA_ARTERIAL

## 2016-03-09 MED ORDER — PHENOL 1.4 % MT LIQD: 1.0000 | OROMUCOSAL | Status: DC | PRN

## 2016-03-09 MED ORDER — AMLODIPINE BESYLATE 5 MG PO TABS
ORAL_TABLET | ORAL | Status: AC
  Filled 2016-03-09: qty 2

## 2016-03-09 MED ORDER — CEFUROXIME SODIUM 1.5 G IJ SOLR
1.5000 g | INTRAMUSCULAR | Status: AC
  Administered 2016-03-09: 1.5 g via INTRAVENOUS

## 2016-03-09 SURGICAL SUPPLY — 15 items
BALLN DORADO 7X60X80 (BALLOONS) ×2
BALLN DORADO 8X60X80 (BALLOONS) ×2
BALLOON DORADO 7X60X80 (BALLOONS) ×1 IMPLANT
BALLOON DORADO 8X60X80 (BALLOONS) ×1 IMPLANT
CANNULA 5F STIFF (CANNULA) ×2 IMPLANT
CATH PALINDROME-P 23CM W/VT (CATHETERS) ×2 IMPLANT
CATH PALINDROME-P 44CM KIT (CATHETERS) ×1
DRAPE BRACHIAL (DRAPES) ×2 IMPLANT
GOWN STRL XL  DISP (MISCELLANEOUS) ×2 IMPLANT
KIT CATH 64X44X15FR RVRS (CATHETERS) ×1 IMPLANT
PACK ANGIOGRAPHY (CUSTOM PROCEDURE TRAY) ×2 IMPLANT
PREP CHG 10.5 TEAL (MISCELLANEOUS) ×2 IMPLANT
SHEATH BRITE TIP 6FRX5.5 (SHEATH) ×2 IMPLANT
TOWEL OR 17X26 4PK STRL BLUE (TOWEL DISPOSABLE) ×4 IMPLANT
WIRE MAGIC TOR.035 180C (WIRE) ×2 IMPLANT

## 2016-03-09 NOTE — H&P (Signed)
  [facility] History & Physical Update  The patient was interviewed and re-examined.  The patient's previous History and Physical has been reviewed and is unchanged.  There is no change in the plan of care. We plan to proceed with the scheduled procedure.  DEW,JASON, MD  03/09/2016, 8:05 AM

## 2016-03-09 NOTE — ED Notes (Signed)
Dr. Clydene Pugh into room prior to triage

## 2016-03-09 NOTE — Op Note (Signed)
[facility]IN [facility] VASCULAR [facility]ERY    OPERATIVE NOTE   PROCEDURE: 1.   Left brachiobasilic arteriovenous fistula cannulation under ultrasound guidance 2.   Left arm fistulagram including central venogram 3.   Percutaneous transluminal angioplasty of left basilic vein/axillary vein with 7 mm diameter and 8 mm diameter high pressure angioplasty balloons  PRE-OPERATIVE DIAGNOSIS: 1. ESRD 2. 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  The patient is scheduled for  left arm fistulagram.  The patient is aware the risks include but are not limited to: bleeding, infection, thrombosis of the cannulated access, and possible anaphylactic reaction to the contrast.  The patient is aware of the risks of the procedure and elects to proceed forward.  DESCRIPTION: After full informed written consent was obtained, the patient was brought back to the angiography suite and placed supine upon the angiography table.  The patient was connected to monitoring equipment. Moderate conscious sedation was administered with a face to face encounter with the patient throughout the procedure with my supervision of the RN administering medicines and monitoring the patient's vital signs and mental status throughout from the start of the procedure until the patient was taken to the  recovery room. The  left arm was prepped and draped in the standard fashion for a percutaneous access intervention.  Under ultrasound guidance, the  left brachiobasilic arteriovenous fistula was cannulated with a micropuncture needle under direct ultrasound guidance and a permanent image was performed.  The microwire was advanced into the fistula and the needle was exchanged for the a microsheath.  I then upsized to a 6 Fr Sheath and imaging was performed.  Hand injections were completed to image the access including the central venous system. 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  Based on the images, this patient will need treatment of this area but will also likely require surgical revision. I then gave the patient 3000 units of intravenous heparin.  I then crossed the stenosis with a Magic Tourqe wire.  Based on the imaging, a 7 mm x 6 cm  high pressure angioplasty balloon was selected.  The balloon was centered around the basilic vein/axillary vein stenosis and inflated to 16 ATM for 1 minute(s).  This was slightly undersized so I used an 8 mm diameter by 6 cm length high pressure angioplasty balloon. This was inflated to 12 atm for 1 minute. On completion imaging, a 40 % residual stenosis was present but with the very large aneurysms, a surgical revision will still need to be performed to salvage the fistula. The wire and balloon were removed from the sheath.  A 4-0 Monocryl purse-string suture was sewn around the sheath.  The sheath was removed while tying down the suture.  A sterile bandage was applied to the puncture site. A PermCath was then placed which will be separately dictated.  COMPLICATIONS: None  CONDITION: Stable  [name] Small  03/09/2016 10:14 AM

## 2016-03-09 NOTE — ED Notes (Addendum)
Pt has a femoral dialysis cath on the right leg that was showing frank blood through the dressing put on today.  Dr. Clydene Pugh took dressing off which showed clotted blood around cath and some slight bleeding at the site.  He changed the dressing with nonadhesive gauze, 4x4 dry sterile gauze, kerlex and coban on the outside to provide pressure.  Advised of need to apply pressure through the night and change the dressing tomorrow.  Pt states she has supplies at home to do so.

## 2016-03-09 NOTE — Op Note (Signed)
OPERATIVE NOTE    PRE-OPERATIVE DIAGNOSIS: 1. ESRD 2. 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 AICD in place in the right arm with left jugular not preferred as surgical revision of her left arm graft may require a hero graft precluding left jugular PermCath POST-OPERATIVE DIAGNOSIS: same as above with IVC filter in place requiring lower placement of the tip of the catheter  PROCEDURE: 1. Ultrasound guidance for vascular access to the right femoral  vein 2. Fluoroscopic guidance for placement of catheter 3. Placement of a 23 cm tip to cuff tunneled hemodialysis catheter via the right femoral vein  SURGEON: DEW,JASON, MD  ANESTHESIA:  Local/MCS  ESTIMATED BLOOD LOSS: Minimal  FINDING(S): 1.  Patent right femoral femoral vein with an Obteze filter in place  SPECIMEN(S):  None  INDICATIONS:   Patient is a 25 y.o.female who presents with end-stage renal disease and a markedly aneurysmal left arm AV fistula which will require surgical revision and not be able to be used for several weeks.   The patient needs long term dialysis access for their ESRD, and a Permcath is necessary.  Risks and benefits are discussed and informed consent is obtained.    DESCRIPTION: After obtaining full informed written consent, the patient was brought back to the vascular suited. The patient's right groin was sterilely prepped and draped and a sterile surgical field was created.  The right femoral vein was visualized with ultrasound and found to be patent. It was then accessed under direct ultrasound guidance and a permanent image was recorded. A wire was placed. After skin nick and dilatation, the peel-away sheath was placed over the wire. I then turned my attention to an area about 5 cm inferior and lateral to the access incision and a small counterincision was created.  I tunneled from the counter  incision to the access site. Using fluoroscopic guidance, a  23 centimeterer tip to cuff tunneled hemodialysis catheter was selected, and tunneled from the counter  incision to the access site. It was then placed through the peel-away sheath and the peel-away sheath was removed. Using fluoroscopic guidance the catheter tips were parked in the distal inferior vena cava below the previous Obteze IVC filter. The appropriate distal connectors were placed. It withdrew blood well and flushed easily with heparinized saline and a concentrated heparin solution was then placed. It was secured to the leg  with 2 Prolene sutures. The access incision was closed single 4-0 Monocryl. A 4-0 Monocryl pursestring suture was placed around the exit site. Sterile dressings were placed. The patient tolerated the procedure well and was taken to the recovery room in stable condition.  COMPLICATIONS: None  CONDITION: Stable    DEW,JASON 03/09/2016 10:18 AM

## 2016-03-09 NOTE — ED Notes (Signed)
Here for R thigh HD cath bleeding. Placed today in [place]. Has been bleeding since 1630. Has changed dressing 5 times. C/o bleeding, pain and swelling. (denies: sob, nvd, dizziness, or other sx). HD MWF. Alert, NAD, calm, interactive, resps e/u, speaking in clear complete sentences, steady gait, no dyspnea noted. Skin W&D.

## 2016-03-09 NOTE — Discharge Instructions (Signed)
Fistulogram, Care After °Refer to this sheet in the next few weeks. These instructions provide you with information on caring for yourself after your procedure. Your health care provider may also give you more specific instructions. Your treatment has been planned according to current medical practices, but problems sometimes occur. Call your health care provider if you have any problems or questions after your procedure. °WHAT TO EXPECT AFTER THE PROCEDURE °After your procedure, it is typical to have the following: °· A small amount of discomfort in the area where the catheters were placed. °· A small amount of bruising around the fistula. °· Sleepiness and fatigue. °HOME CARE INSTRUCTIONS °· Rest at home for the day following your procedure. °· Do not drive or operate heavy machinery while taking pain medicine. °· Take medicines only as directed by your health care provider. °· Do not take baths, swim, or use a hot tub until your health care provider approves. You may shower 24 hours after the procedure or as directed by your health care provider. °· There are many different ways to close and cover an incision, including stitches, skin glue, and adhesive strips. Follow your health care provider's instructions on: °¨ Incision care. °¨ Bandage (dressing) changes and removal. °¨ Incision closure removal. °· Monitor your dialysis fistula carefully. °SEEK MEDICAL CARE IF: °· You have drainage, redness, swelling, or pain at your catheter site. °· You have a fever. °· You have chills. °SEEK IMMEDIATE MEDICAL CARE IF: °· You feel weak. °· You have trouble balancing. °· You have trouble moving your arms or legs. °· You have problems with your speech or vision. °· You can no longer feel a vibration or buzz when you put your fingers over your dialysis fistula. °· The limb that was used for the procedure: °¨ Swells. °¨ Is painful. °¨ Is cold. °¨ Is discolored, such as blue or pale white. °  °This information is not intended  to replace advice given to you by your health care provider. Make sure you discuss any questions you have with your health care provider. °  °Document Released: 02/23/2014 Document Reviewed: 02/23/2014 °Elsevier Interactive Patient Education ©2016 [employer] ° °

## 2016-03-09 NOTE — Progress Notes (Signed)
Pt post procedure clinicallly stable, permcath right thigh, Dr Wyn Quaker olut to speak with patient and parents regarding need for new placement, pt complaints leg pain, prn pain med given ,received lg dosing meds for procedure and awake, hypertensive, giving home norvasc prior to discharge, approved for discharge per Dr Wyn Quaker, eating breakfast. Return appt obtained,

## 2016-03-09 NOTE — Discharge Instructions (Signed)
Central Line Dialysis Access Placement, Care After Refer to this sheet after your procedure. These instructions provide you with information on caring for yourself after your procedure. Your health care provider may also give you more specific instructions. Your treatment has been planned according to current medical practices, but problems sometimes occur. Call your health care provider if you have any problems or questions after your procedure.  WHAT TO EXPECT AFTER THE PROCEDURE  You may feel some discomfort after the local anesthetic wears off. Your discomfort should gradually improve over the next several days. Ask your health care provider if you can take pain medicines.  You may notice some redness, mild pain, swelling, bruising, or light bleeding at the site where the thin, flexible tube (catheter) was placed. These should improve over the next several days. HOME CARE INSTRUCTIONS   Rest for the remainder of the day.  Avoid any heavy lifting (more than 10 lb [4.5 kg]) for at least 3 days.   If your catheter bandage (dressing) becomes soaked with blood, apply firm and direct pressure on the insertion site and sit up for 20 minutes. Your dialysis nurses will change your dressing during your next visit or at your next dialysis treatment.  Keep the dressing around the insertion site dry. Avoid using the shower. You may take a bath after 24 hours. Bathe in a tub and keep the dressing and catheter above the level of the water. Do not submerge catheter underwater, such as by swimming.  You may resume your usual diet.  Do not operate heavy machinery, drive, or make legal decisions for the first 24 hours after the procedure if you were given sedatives or other medicines to help you relax.  SEEK MEDICAL CARE IF:  The catheter gets pulled or comes out.  You develop any signs of infection around the insertion site such as:   Bleeding that does not stop even after applying pressure as  instructed.  Increased pain.  Unusual drainage such as pus.   You develop a fever or chills.   You have any other questions or concerns related to your procedure or the care of your central line.  SEEK IMMEDIATE MEDICAL CARE IF:  You develop lightheadedness or dizziness.   You faint.   You develop shortness of breath or difficulty breathing.   You have any symptoms of an allergic reaction, such as:  Itching.   Rash at the site of insertion.   This information is not intended to replace advice given to you by your health care provider. Make sure you discuss any questions you have with your health care provider.   Document Released: 05/23/2004 Document Revised: 10/14/2013 Document Reviewed: 07/18/2012 Elsevier Interactive Patient Education [employer]

## 2016-03-09 NOTE — ED Notes (Signed)
MD at bedside. 

## 2016-03-09 NOTE — ED Provider Notes (Signed)
CSN: [id number]     Arrival date & time 03/09/16  2011 History  By signing my name below, I, [name] Small, attest that this documentation has been prepared under the direction and in the presence of Lyndal Pulley, MD. Electronically Signed: Octavia Small, ED Scribe. 03/09/2016. 8:59 PM.    Chief Complaint  Patient presents with  . Bleeding/Bruising      The history is provided by the patient. No language interpreter was used.   HPI Comments: [name] Small is a 25 y.o. female who has a PMHx of HTN, PE, CHF, cardiomyopathy, anemia, ESRD, polycycstic kidney disease, hyperkalemia, GERD, and osteoporosis presents to the Emergency Department presenting with catheter care. Pt reports having a new access line placed at 7:45 this morning and it started bleeding about 3 hours later. She says whenever she ambulates she feels as if the catheter is pulling. Pt is not on any blood thinners and does not take any aspirin. She denies any other complaints.  Past Medical History  Diagnosis Date  . Hemodialysis patient (HCC)   . Hypertension   . Pulmonary emboli (HCC) 01/2012    Bilateral, moderate clot burden, areas of pulmonary infarction and central necrosis  . CHF (congestive heart failure) (HCC)   . Cardiomyopathy   . Dysrhythmia     at times per pt.  . Anemia   . H/O transfusion of packed red blood cells   . 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 Polycystic kidney disease   . Cellulitis and abscess of face 03/22/2013  . Renal insufficiency   . Chronic pain   . GERD (gastroesophageal reflux disease)   . Depression   . Hyperkalemia 09/2015  . Pelvic fracture (HCC)   . Osteoporosis   . Sickle cell anemia (HCC)   . Narcotic abuse, continuous    Past Surgical History  Procedure Laterality Date  . Nephrectomy    . Av fistula placement    . Kidney transplant  2008    failed  .  Tonsillectomy      as a child.  . Adenoidectomy    . Incision and drainage abscess Right 03/21/2013    Procedure: INCISION AND DRAINAGE RIGHT CHEEK ABSCESS REMOVAL OF FOREIGN BODY;  Surgeon: Serena Colonel, MD;  Location: [facility] OR;  Service: ENT;  Laterality: Right;  . Implantable cardioverter defibrillator implant Right 12/2014   Family History  Problem Relation Age of Onset  . Polycystic kidney disease Father    Social History  Substance Use Topics  . Smoking status: Former Smoker -- 0.00 packs/day for 1 years    Types: Cigarettes    Quit date: 12/08/2015  . Smokeless tobacco: Never Used  . Alcohol Use: No   OB History    No data available     Review of Systems  Skin: Positive for wound.  All other systems reviewed and are negative.     Allergies  Tramadol; Vicodin; Buprenorphine hcl; Iohexol; and Morphine and related  Home Medications   Prior to Admission medications   Medication Sig Start Date End Date Taking? Authorizing Provider  amLODipine (NORVASC) 10 MG tablet Take 1 tablet (10 mg total) by mouth daily. 01/04/16   Quentin Angst, MD  calcium carbonate, dosed in mg elemental calcium, 1250 (500 Ca) MG/5ML Take 5 mLs (500 mg of elemental calcium total) by mouth every 6 (six) hours as needed for indigestion. 01/11/16  Rhetta Mura, MD  carvedilol (COREG) 6.25 MG tablet Take 1 tablet (6.25 mg total) by mouth 2 (two) times daily. 01/04/16   Quentin Angst, MD  cinacalcet (SENSIPAR) 30 MG tablet Take 1 tablet (30 mg total) by mouth daily with breakfast. 01/04/16   Quentin Angst, MD  diclofenac sodium (VOLTAREN) 1 % GEL Apply 2 g topically 4 (four) times daily. 02/29/16   Quentin Angst, MD  isosorbide dinitrate (ISORDIL) 10 MG tablet Take 1 tablet (10 mg total) by mouth 3 (three) times daily. 02/01/16   Olugbemiga Annitta Needs, MD  LORazepam (ATIVAN) 1 MG tablet Take 1 tablet (1 mg total) by mouth daily as needed for anxiety. 02/29/16   Quentin Angst, MD   multivitamin (RENA-VIT) TABS tablet Take 1 tablet by mouth daily. 01/04/16   Olugbemiga Annitta Needs, MD  oxyCODONE (ROXICODONE) 5 MG immediate release tablet Take 1 tablet (5 mg total) by mouth every 4 (four) hours as needed for severe pain. 02/29/16   Quentin Angst, MD  sertraline (ZOLOFT) 100 MG tablet  01/04/16   Historical Provider, MD  sevelamer carbonate (RENVELA) 800 MG tablet Take 2-3 tablets (1,600-2,400 mg total) by mouth 3 (three) times daily with meals. 3 tabs with meals, 2 tabs with snacks 01/04/16 01/03/17  Quentin Angst, MD   Triage vitals: BP 156/119 mmHg  Pulse 109  Temp(Src) 98.3 F (36.8 C) (Oral)  Resp 18  Ht  (1.6 m)  Wt 127 lb 14.4 oz (58.015 kg)  BMI 22.66 kg/m2  SpO2 97%  LMP  Physical Exam  Constitutional: She is oriented to person, place, and time. She appears well-developed and well-nourished.  HENT:  Head: Normocephalic.  Eyes: EOM are normal.  Neck: Normal range of motion.  Cardiovascular:  Cap refill good, distal pulses intact  Pulmonary/Chest: Effort normal.  Abdominal: She exhibits no distension.  Musculoskeletal: Normal range of motion.  Coagulated blood surrounding site of right upper thigh, partially soaked blood surrounding the site  Neurological: She is alert and oriented to person, place, and time.  Normal strength and sensation in lower extremities  Psychiatric: She has a normal mood and affect.  Nursing note and vitals reviewed.   ED Course  Procedures  DIAGNOSTIC STUDIES: Oxygen Saturation is 97% on RA, normal by my interpretation.  COORDINATION OF CARE:  8:25 PM Discussed treatment plan with pt at bedside and pt agreed to plan.  Labs Review Labs Reviewed - No data to display  Imaging Review No results found. I have personally reviewed and evaluated these images and lab results as part of my medical decision-making.   EKG Interpretation None      MDM   Final diagnoses:  Bleeding due to dialysis catheter  placement, initial encounter [facility])    25 y.o. female presents with oozing blood from catheter placement site on right thigh. Appears to be bleeding into dressing but no active bleeding noted when removed. Placed non-stick gauze and wrapped coban around site to add pressure to achieve hemostasis. Pt requested to place extra weight over the site through the dressing to remain hemostatic and to change dressing tomorrow or return if bleeding through.    I personally performed the services described in this documentation, which was scribed in my presence. The recorded information has been reviewed and is accurate.     Lyndal Pulley, MD 03/10/16 [phone]

## 2016-03-09 NOTE — ED Notes (Signed)
Pt verbalizes understanding of d/c instructions and denies any further needs at this time. 

## 2016-03-10 ENCOUNTER — Encounter: Payer: Self-pay | Admitting: Vascular Surgery

## 2016-03-13 ENCOUNTER — Other Ambulatory Visit: Payer: Self-pay

## 2016-03-13 ENCOUNTER — Telehealth: Payer: Self-pay

## 2016-03-13 DIAGNOSIS — Z992 Dependence on renal dialysis: Secondary | ICD-10-CM

## 2016-03-13 DIAGNOSIS — N186 End stage renal disease: Secondary | ICD-10-CM

## 2016-03-13 MED ORDER — SERTRALINE HCL 100 MG PO TABS: 100.0000 mg | ORAL_TABLET | Freq: Every day | ORAL | Status: DC

## 2016-03-13 MED ORDER — CINACALCET HCL 30 MG PO TABS: 30.0000 mg | ORAL_TABLET | Freq: Every day | ORAL | Status: DC

## 2016-03-13 NOTE — Telephone Encounter (Signed)
Pt is requesting a medication refill for Sensipar, 30mg  and Zoloft, 100mg . Thanks!

## 2016-03-13 NOTE — Telephone Encounter (Signed)
Refills sent into pharmacy. Thanks!  

## 2016-03-14 NOTE — H&P (Signed)
[facility] Admission History & Physical  MRN : [id number]  [name] Small is a 25 y.o. (04-21-1991) female who presents with chief complaint of No chief complaint on file. Marland Kitchen  History of Present Illness:  Patient presents for evaluation and treatment of a markedly aneurysmal left arm AV fistula. This is now painful and having prolonged bleeding. She reports artery having had a surgical revision with an aneurysm plication many months ago. She desires to have something done about this and does not want this fistula used anymore and less than is fixed. No current facility-administered medications for this encounter.   Current Outpatient Prescriptions  Medication Sig Dispense Refill  . amLODipine (NORVASC) 10 MG tablet Take 1 tablet (10 mg total) by mouth daily. 90 tablet 3  . calcium carbonate, dosed in mg elemental calcium, 1250 (500 Ca) MG/5ML Take 5 mLs (500 mg of elemental calcium total) by mouth every 6 (six) hours as needed for indigestion. 450 mL 30  . carvedilol (COREG) 6.25 MG tablet Take 1 tablet (6.25 mg total) by mouth 2 (two) times daily. 180 tablet 3  . diclofenac sodium (VOLTAREN) 1 % GEL Apply 2 g topically 4 (four) times daily. 1 Tube 3  . isosorbide dinitrate (ISORDIL) 10 MG tablet Take 1 tablet (10 mg total) by mouth 3 (three) times daily. 90 tablet 3  . LORazepam (ATIVAN) 1 MG tablet Take 1 tablet (1 mg total) by mouth daily as needed for anxiety. 30 tablet 0  . multivitamin (RENA-VIT) TABS tablet Take 1 tablet by mouth daily. 90 tablet 3  . oxyCODONE (ROXICODONE) 5 MG immediate release tablet Take 1 tablet (5 mg total) by mouth every 4 (four) hours as needed for severe pain. 90 tablet 0  . sevelamer carbonate (RENVELA) 800 MG tablet Take 2-3 tablets (1,600-2,400 mg total) by mouth 3 (three) times daily with meals. 3 tabs with meals, 2 tabs with snacks 120 tablet 3  . cinacalcet (SENSIPAR) 30 MG tablet Take 1 tablet (30 mg total) by mouth daily with  breakfast. 90 tablet 3  . sertraline (ZOLOFT) 100 MG tablet Take 1 tablet (100 mg total) by mouth daily. 30 tablet 3    Past Medical History  Diagnosis Date  . Hemodialysis patient (HCC)   . Hypertension   . Pulmonary emboli (HCC) 01/2012    Bilateral, moderate clot burden, areas of pulmonary infarction and central necrosis  . CHF (congestive heart failure) (HCC)   . Cardiomyopathy   . Dysrhythmia     at times per pt.  . Anemia   . H/O transfusion of packed red blood cells   . 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 Polycystic kidney disease   . Cellulitis and abscess of face 03/22/2013  . Renal insufficiency   . Chronic pain   . GERD (gastroesophageal reflux disease)   . Depression   . Hyperkalemia 09/2015  . Pelvic fracture (HCC)   . Osteoporosis   . Sickle cell anemia (HCC)   . Narcotic abuse, continuous     Past Surgical History  Procedure Laterality Date  . Nephrectomy    . Av fistula placement    . Kidney transplant  2008    failed  . Tonsillectomy      as a child.  . Adenoidectomy    . Incision and drainage abscess Right 03/21/2013    Procedure: INCISION AND DRAINAGE RIGHT  CHEEK ABSCESS REMOVAL OF FOREIGN BODY;  Surgeon: Serena Colonel, MD;  Location: [facility] OR;  Service: ENT;  Laterality: Right;  . Implantable cardioverter defibrillator implant Right 12/2014  . Peripheral vascular catheterization Left 03/09/2016    Procedure: A/V Shuntogram/Fistulagram;  Surgeon: Annice Needy, MD;  Location: [facility] INVASIVE CV LAB;  Service: Cardiovascular;  Laterality: Left;    Social History Social History  Substance Use Topics  . Smoking status: Former Smoker -- 0.00 packs/day for 1 years    Types: Cigarettes    Quit date: 12/08/2015  . Smokeless tobacco: Never Used  . Alcohol Use: No  No IV drug use  Family History Family History  Problem Relation Age of Onset  . Polycystic  kidney disease Father   No bleeding disorders, clotting disorders, or autoimmune diseases  Allergies  Allergen Reactions  . Tramadol Anaphylaxis  . Vicodin [Hydrocodone-Acetaminophen] Hives  . Buprenorphine Hcl Itching    Ok with oxycodone  . Iohexol Itching  . Morphine And Related Itching    Ok with oxycodone     REVIEW OF SYSTEMS (Negative unless checked)  Constitutional: Weight loss  Fever  Chills Cardiac: Chest pain   Chest pressure   Palpitations   Shortness of breath when laying flat   Shortness of breath at rest   Shortness of breath with exertion. Vascular:  Pain in legs with walking   Pain in legs at rest   Pain in legs when laying flat   Claudication   Pain in feet when walking  Pain in feet at rest  Pain in feet when laying flat   History of DVT   Phlebitis   Swelling in legs   Varicose veins   Non-healing ulcers Pulmonary:   Uses home oxygen   Productive cough   Hemoptysis   Wheeze  COPD   Asthma Neurologic:  Dizziness  Blackouts   Seizures   History of stroke   History of TIA  Aphasia   Temporary blindness   Dysphagia   Weakness or numbness in arms   Weakness or numbness in legs Musculoskeletal:  Arthritis   Joint swelling   Joint pain   Low back pain Hematologic:  Easy bruising  Easy bleeding   Hypercoagulable state   Anemic  Hepatitis Gastrointestinal:  Blood in stool   Vomiting blood  Gastroesophageal reflux/heartburn   Difficulty swallowing. Genitourinary:  Chronic kidney disease   Difficult urination  Frequent urination  Burning with urination   Blood in urine Skin:  Rashes   Ulcers   Wounds Psychological:  History of anxiety    History of major depression.  Physical Examination  Filed Vitals:   03/09/16 1028 03/09/16 1030 03/09/16 1045 03/09/16 1100  BP: 159/127 159/115 168/110 156/119  Pulse:      Temp:      TempSrc:      Resp: Height:      Weight:      SpO2: 98% 98% 98% 98%   Body mass index is 20.55 kg/(m^2). Gen: WD/WN, NAD Head: Brule/AT, No temporalis wasting. Prominent temp pulse not noted. Ear/Nose/Throat: Hearing grossly intact, nares w/o erythema or drainage, oropharynx w/o Erythema/Exudate,  Eyes: PERRLA, EOMI.  Neck: Supple, no nuchal rigidity.  No JVD.  Pulmonary:  Good air movement, no use of accessory muscles.  Cardiac: RRR, normal S1, S2,  Vascular: Markedly aneurysmal left arm AV fistula with good thrill and bruit Vessel Right Left  Radial Palpable Palpable  Gastrointestinal: soft, non-tender/non-distended. No guarding/reflex.  Musculoskeletal: M/S 5/5 throughout.  Extremities without ischemic changes.  No deformity or atrophy.  Neurologic: CN 2-12 intact. Pain and light touch intact in extremities.  Symmetrical.  Speech is fluent. Motor exam as listed above. Psychiatric: Judgment intact, Mood & affect appropriate for pt's clinical situation. Dermatologic: No rashes or ulcers noted.  No cellulitis or open wounds. Lymph : No Cervical, Axillary, or Inguinal lymphadenopathy.      CBC Lab Results  Component Value Date   WBC 6.3 03/06/2016   HGB 12.0 03/06/2016   HCT 36.7 03/06/2016   MCV 90.6 03/06/2016   PLT 211 03/06/2016    BMET    Component Value Date/Time   NA 138 03/06/2016 2130   K 5.1 03/06/2016 2130   CL 96* 03/06/2016 2130   CO2 22 03/06/2016 2130   GLUCOSE 86 03/06/2016 2130   BUN 76* 03/06/2016 2130   CREATININE 12.82* 03/06/2016 2130   CALCIUM 10.2 03/06/2016 2130   GFRNONAA 4* 03/06/2016 2130   GFRAA 4* 03/06/2016 2130   Estimated Creatinine Clearance: 5.6 mL/min (by C-G formula based on Cr of 12.82).  COAG Lab Results  Component Value Date   INR 1.51* 06/29/2015   INR 1.36 01/26/2015   INR 1.25 05/31/2012    Radiology Dg Chest 2 View  03/06/2016  CLINICAL DATA:  Chest pain that began yesterday, sharp pain,  history end-stage renal disease on dialysis, pulmonary embolism, CHF, cardiomyopathy, sickle cell anemia, former smoker, failed renal transplant EXAM: CHEST  2 VIEW COMPARISON:  02/20/2016 FINDINGS: RIGHT subclavian AICD lead projects at RIGHT ventricle. Significant enlargement of cardiac silhouette with pulmonary vascular congestion. Mediastinal contours normal. Lungs hyperinflated but clear. No pleural effusion or pneumothorax. No acute osseous findings. IMPRESSION: Enlargement of cardiac silhouette with pulmonary vascular congestion post AICD. Hyperinflated lungs without infiltrate. Electronically Signed   By: Ulyses Southward M.D.   On: 03/06/2016 21:41   Dg Chest 2 View  02/20/2016  CLINICAL DATA:  Left-sided chest pain, onset last night. History of hypertension, congestive heart failure, sickle cell. EXAM: CHEST  2 VIEW COMPARISON:  02/12/2016 FINDINGS: Cardiac pacemaker. Diffuse cardiac enlargement. No pulmonary vascular congestion. No focal airspace disease or consolidation. No blunting of costophrenic angles. No pneumothorax. Mediastinal contours appear intact. IMPRESSION: Diffuse cardiac enlargement. No evidence of active pulmonary disease. Electronically Signed   By: Burman Nieves M.D.   On: 02/20/2016 21:55      Assessment/Plan 1. Complication of dialysis access.  Markedly aneurysmal AV fistula. Will require surgical revision. We'll place a PermCath and do a fistulogram for further evaluation to determine what our options for surgical revision might be. 2. End-stage renal disease. Access options are limited as described above. 3. Cardiac arrhythmias with AICD in place. Limits our access options as well.   DEW,JASON, MD  03/14/2016 3:54 PM

## 2016-03-16 ENCOUNTER — Encounter
Admission: RE | Admit: 2016-03-16 | Discharge: 2016-03-16 | Disposition: A | Discharge status: Home / Self Care | Payer: Medicare Other | Source: Ambulatory Visit | Attending: Vascular Surgery | Admitting: Vascular Surgery

## 2016-03-16 ENCOUNTER — Other Ambulatory Visit: Payer: Self-pay | Admitting: Vascular Surgery

## 2016-03-16 DIAGNOSIS — I120 Hypertensive chronic kidney disease with stage 5 chronic kidney disease or end stage renal disease: Secondary | ICD-10-CM | POA: Diagnosis not present

## 2016-03-16 DIAGNOSIS — N186 End stage renal disease: Secondary | ICD-10-CM | POA: Insufficient documentation

## 2016-03-16 DIAGNOSIS — I509 Heart failure, unspecified: Secondary | ICD-10-CM | POA: Insufficient documentation

## 2016-03-16 DIAGNOSIS — Z01810 Encounter for preprocedural cardiovascular examination: Secondary | ICD-10-CM | POA: Insufficient documentation

## 2016-03-16 DIAGNOSIS — Z86711 Personal history of pulmonary embolism: Secondary | ICD-10-CM | POA: Diagnosis not present

## 2016-03-16 DIAGNOSIS — Z01812 Encounter for preprocedural laboratory examination: Secondary | ICD-10-CM | POA: Insufficient documentation

## 2016-03-16 HISTORY — DX: Presence of automatic (implantable) cardiac defibrillator: Z95.810

## 2016-03-16 HISTORY — DX: Anxiety disorder, unspecified: F41.9

## 2016-03-16 LAB — TYPE AND SCREEN
ABO/RH(D): O POS
ANTIBODY SCREEN: NEGATIVE

## 2016-03-16 LAB — BASIC METABOLIC PANEL
ANION GAP: 14 (ref 5–15)
BUN: 50 mg/dL — ABNORMAL HIGH (ref 6–20)
CHLORIDE: 97 mmol/L — AB (ref 101–111)
CO2: 27 mmol/L (ref 22–32)
Calcium: 9.9 mg/dL (ref 8.9–10.3)
Creatinine, Ser: 6.96 mg/dL — ABNORMAL HIGH (ref 0.44–1.00)
GFR calc Af Amer: 9 mL/min — ABNORMAL LOW (ref >60)
GFR, EST NON AFRICAN AMERICAN: 7 mL/min — AB (ref >60)
Glucose, Bld: 77 mg/dL (ref 65–99)
POTASSIUM: 4.3 mmol/L (ref 3.5–5.1)
SODIUM: 138 mmol/L (ref 135–145)

## 2016-03-16 LAB — CBC WITH DIFFERENTIAL/PLATELET
BASOS ABS: 0 10*3/uL (ref 0–0.1)
Basophils Relative: 1 %
Eosinophils Absolute: 0 10*3/uL (ref 0–0.7)
Eosinophils Relative: 0 %
HEMATOCRIT: 33.7 % — AB (ref 35.0–47.0)
HEMOGLOBIN: 10.7 g/dL — AB (ref 12.0–16.0)
LYMPHS ABS: 1.1 10*3/uL (ref 1.0–3.6)
LYMPHS PCT: 22 %
MCH: 29.5 pg (ref 26.0–34.0)
MCHC: 31.8 g/dL — ABNORMAL LOW (ref 32.0–36.0)
MCV: 92.5 fL (ref 80.0–100.0)
Monocytes Absolute: 0.3 10*3/uL (ref 0.2–0.9)
Monocytes Relative: 6 %
Neutro Abs: 3.6 10*3/uL (ref 1.4–6.5)
Neutrophils Relative %: 71 %
Platelets: 180 10*3/uL (ref 150–440)
RBC: 3.64 MIL/uL — AB (ref 3.80–5.20)
RDW: 19.3 % — ABNORMAL HIGH (ref 11.5–14.5)
WBC: 5 10*3/uL (ref 3.6–11.0)

## 2016-03-16 LAB — SURGICAL PCR SCREEN
MRSA, PCR: NEGATIVE
Staphylococcus aureus: POSITIVE — AB

## 2016-03-16 LAB — APTT: APTT: 37 s — AB (ref 24–36)

## 2016-03-16 LAB — PROTIME-INR
INR: 1.21
Prothrombin Time: 15.5 seconds — ABNORMAL HIGH (ref 11.4–15.0)

## 2016-03-16 NOTE — Patient Instructions (Signed)
  Your procedure is scheduled on:Mar 22, 2016 (Wednesday) Report to Same Day Surgery 2nd floor Medical Mall To find out your arrival time please call [phone] between 1PM - 3PM on Mar 21, 2016 (Tuesday)  Remember: Instructions that are not followed completely may result in serious medical risk, up to and including death, or upon the discretion of your surgeon and anesthesiologist your surgery may need to be rescheduled.    _x___ 1. Do not eat food or drink liquids after midnight. No gum chewing or hard candies.     __x__ 2. No Alcohol for 24 hours before or after surgery.   ____ 3. Bring all medications with you on the day of surgery if instructed.    __x__ 4. Notify your doctor if there is any change in your medical condition     (cold, fever, infections).     Do not wear jewelry, make-up, hairpins, clips or nail polish.  Do not wear lotions, powders, or perfumes. You may wear deodorant.  Do not shave 48 hours prior to surgery. Men may shave face and neck.  Do not bring valuables to the hospital.    [facility] is not responsible for any belongings or valuables.               Contacts, dentures or bridgework may not be worn into surgery.  Leave your suitcase in the car. After surgery it may be brought to your room.  For patients admitted to the hospital, discharge time is determined by your treatment team.   Patients discharged the day of surgery will not be allowed to drive home.    Please read over the following fact sheets that you were given:   [facility] Preparing for Surgery and or MRSA Information   _x___ Take these medicines the morning of surgery with A SIP OF WATER:    1. Amlodipine  2.Carvedilol  3.Isosorbide       ____ Fleet Enema (as directed)   _x___ Use CHG Soap or sage wipes as directed on instruction sheet   ____ Use inhalers on the day of surgery and bring to hospital day of surgery  ____ Stop metformin 2 days prior to surgery    ____ Take 1/2  of usual insulin dose the night before surgery and none on the morning of surgery          _x__ Stop aspirin or coumadin, or plavix (N/A)  _x__ Stop Anti-inflammatories such as Advil, Aleve, Ibuprofen, Motrin, Naproxen,          Naprosyn, Goodies powders or aspirin products.(STOP VOLTAREN GEL NOW)  ____ Stop supplements until after surgery.    ____ Bring C-Pap to the hospital.

## 2016-03-16 NOTE — Pre-Procedure Instructions (Signed)
Excerpt from Note: Shirlyn Goltz, MD - 12/17/2014 6:47 PM EST              [facility]              Discharge Summary  " Hospital Course:  Miss Andreyah, Demario is a 25 y/o AAF with hx of ESRD on HD due to polycystic kidney disease and non-ischemic cardiomyopathy with EF of about 10% who came to ED on 2/17. She was admitted due to anasarca and sob. She underwent a paracentesis on 2/19 for ascites felt secondary to severe CMP. 1900 cc of straw- colored fluid was removed. Repeat U/S x 2 revealed a small amount of fluid. Cardiology completed AICD placement on December 16, 2014. "  Reported above along with today's EKG done in PAT, recent ED visits for chest pain and elevated troponin levels to Dr. Henrene Hawking, order received to obtain cardiac clearance.

## 2016-03-17 LAB — ABO/RH: ABO/RH(D): O POS

## 2016-03-17 NOTE — Pre-Procedure Instructions (Addendum)
CARDIAC CLEARANCE REQUEST/EKG AS INSTRUCTED Y DR NTIRWER CALLED AND FAXED TO DR Wyn Quaker. SPOKE WITH EBONY. LABS WITH [facility] ALSO FAXED ALSO CALLED AND FAXED TO DR Jessee Avers CARDIOLOGIST WHO IS SEEING PATIENT 03/21/16 1 PM. SPOKE WITH ANTHONY

## 2016-03-21 ENCOUNTER — Encounter: Payer: Self-pay | Admitting: Cardiology

## 2016-03-21 ENCOUNTER — Ambulatory Visit (INDEPENDENT_AMBULATORY_CARE_PROVIDER_SITE_OTHER): Payer: Medicare Other | Admitting: Cardiology

## 2016-03-21 ENCOUNTER — Telehealth: Payer: Self-pay | Admitting: Cardiology

## 2016-03-21 VITALS — BP 160/110 | HR 86 | Ht 63.0 in | Wt 119.2 lb

## 2016-03-21 DIAGNOSIS — I5042 Chronic combined systolic (congestive) and diastolic (congestive) heart failure: Secondary | ICD-10-CM

## 2016-03-21 LAB — CUP PACEART INCLINIC DEVICE CHECK
Brady Statistic RV Percent Paced: 1 % — CL
HIGH POWER IMPEDANCE MEASURED VALUE: 36 Ohm
HIGH POWER IMPEDANCE MEASURED VALUE: 41 Ohm
Lead Channel Impedance Value: 482 Ohm
Lead Channel Sensing Intrinsic Amplitude: 9.8 mV
Lead Channel Setting Pacing Amplitude: 2.4 V
Lead Channel Setting Sensing Sensitivity: 0.6 mV
MDC IDC LEAD IMPLANT DT: 20160224
MDC IDC LEAD LOCATION: 753860
MDC IDC LEAD MODEL: 292
MDC IDC LEAD SERIAL: 364511
MDC IDC MSMT LEADCHNL RV PACING THRESHOLD AMPLITUDE: 0.4 V
MDC IDC MSMT LEADCHNL RV PACING THRESHOLD PULSEWIDTH: 0.5 ms
MDC IDC SESS DTM: 20170530040000
MDC IDC SET LEADCHNL RV PACING PULSEWIDTH: 0.5 ms
Pulse Gen Serial Number: 199295

## 2016-03-21 MED ORDER — HYDRALAZINE HCL 25 MG PO TABS: 25.0000 mg | ORAL_TABLET | Freq: Three times a day (TID) | ORAL | Status: DC

## 2016-03-21 MED ORDER — AMLODIPINE BESYLATE 5 MG PO TABS: 5.0000 mg | ORAL_TABLET | Freq: Every day | ORAL | Status: DC

## 2016-03-21 MED ORDER — CARVEDILOL 12.5 MG PO TABS: 12.5000 mg | ORAL_TABLET | Freq: Two times a day (BID) | ORAL | Status: DC

## 2016-03-21 NOTE — Telephone Encounter (Signed)
New message   Fax request sent over [phone]   Request for surgical clearance:  1. What type of surgery is being performed? Revision on fistula     2. When is this surgery scheduled? 5.31.2017    3. Are there any medications that need to be held prior to surgery and how long? No   4. Name of physician performing surgery? Dr. Wyn Quaker  5. What is your office phone and fax number? Fax # [phone] .

## 2016-03-21 NOTE — Pre-Procedure Instructions (Signed)
Cardiac clearance received from Dr. Elberta Fortis , and patient cleared for surgery at intermediate risk.

## 2016-03-21 NOTE — Patient Instructions (Addendum)
Medication Instructions:  Your physician has recommended you make the following change in your medication:  1) INCREASE Carvedilol to 12.5 mg twice a day    (new prescription sent to pharmacy with 12.5 mg tablets) 2) DECREASE Amlodipine to 5 mg once a day      (new prescription sent to pharmacy with 5 mg tablets) 3) START Hydralazine 25 mg three times a day  Labwork: None ordered  Testing/Procedures: None ordered  Follow-Up: Your physician recommends that you schedule a follow-up appointment in: 3 months with Dr. Elberta Fortis.  If you need a refill on your cardiac medications before your next appointment, please call your pharmacy.  Thank you for choosing [facility]!!   Dory Horn, RN [phone]

## 2016-03-21 NOTE — Telephone Encounter (Signed)
Spoke with Dondra Spry at [facility] informing her Dr. Elberta Fortis sent OV copy to Dr. Wyn Quaker giving the ok for intermediate risk procedure tomorrow. She thanks me for calling and will print OV for physician.

## 2016-03-21 NOTE — Progress Notes (Signed)
Electrophysiology Office Note   Date:  03/21/2016   ID:  [name] Small, DOB 1991/10/21, MRN [id number]  PCP:  Jeanann Lewandowsky, MD Primary Electrophysiologist:  Regan Lemming, MD    Chief Complaint  Patient presents with  . New Patient (Initial Visit)  . Congestive Heart Failure  . Pacemaker Check     History of Present Illness: [name] Small is a 25 y.o. female who presents today for electrophysiology evaluation.   She has a history of extensive medical history including end-stage renal disease on hemodialysis, pulmonary embolism, congestive heart failure status post AICD placement with low ejection fraction of 20-25% in January 2017, chronic pain syndrome and hypertension.   Today, she denies symptoms of palpitations, chest pain, shortness of breath, orthopnea, PND, lower extremity edema, claudication, dizziness, presyncope, syncope, bleeding, or neurologic sequela. The patient is tolerating medications without difficulties.  She does say that she has significant fatigue and weakness. She says it is difficult to get up and move around sometimes because of the fatigue. Otherwise she has no shortness of breath, or chest pain.   Past Medical History  Diagnosis Date  . Hemodialysis patient (HCC)     Mon. Wed. Fri  . Hypertension   . Pulmonary emboli (HCC) 01/2012    Bilateral, moderate clot burden, areas of pulmonary infarction and central necrosis  . CHF (congestive heart failure) (HCC)   . Cardiomyopathy   . Dysrhythmia     at times per pt.  . Anemia   . H/O transfusion of packed red blood cells   . 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 Polycystic kidney disease   . Cellulitis and abscess of face 03/22/2013  . Renal insufficiency   . Chronic pain   . GERD (gastroesophageal reflux disease)   . Depression   . Hyperkalemia 09/2015  . Pelvic fracture (HCC)     . Osteoporosis   . Sickle cell anemia (HCC)   . Narcotic abuse, continuous   . 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 Anxiety   . Sickle cell anemia with crisis [facility]) March 2017   Past Surgical History  Procedure Laterality Date  . Nephrectomy Right     third kidney placed in 2008, and body rejected in 2012   . Av fistula placement    . Kidney transplant  2008    failed  . Tonsillectomy      as a child.  . Adenoidectomy    . Incision and drainage abscess Right 03/21/2013    Procedure: INCISION AND DRAINAGE RIGHT CHEEK ABSCESS REMOVAL OF FOREIGN BODY;  Surgeon: Serena Colonel, MD;  Location: [facility] OR;  Service: ENT;  Laterality: Right;  . Implantable cardioverter defibrillator implant Right 12/2014  . Peripheral vascular catheterization Left 03/09/2016    Procedure: A/V Shuntogram/Fistulagram;  Surgeon: Annice Needy, MD;  Location: [facility] INVASIVE CV LAB;  Service: Cardiovascular;  Laterality: Left;  . Insertion of dialysis catheter Right Mar 09, 2016    thigh, Dr. Wyn Quaker, [facility]  . Cardiac catheterization       Current Outpatient Prescriptions  Medication Sig Dispense Refill  . amLODipine (NORVASC) 10 MG tablet Take 1 tablet (10 mg total) by mouth daily. 90 tablet 3  . calcium carbonate, dosed in mg elemental calcium, 1250 (500 Ca) MG/5ML Take 5 mLs (500 mg of elemental calcium total) by mouth every 6 (six) hours  as needed for indigestion. 450 mL 30  . carvedilol (COREG) 6.25 MG tablet Take 1 tablet (6.25 mg total) by mouth 2 (two) times daily. 180 tablet 3  . cinacalcet (SENSIPAR) 30 MG tablet Take 1 tablet (30 mg total) by mouth daily with breakfast. (Patient taking differently: Take 30 mg by mouth daily with supper. ) 90 tablet 3  . diclofenac sodium (VOLTAREN) 1 % GEL Apply 2 g topically 4 (four) times daily. 1 Tube 3  . isosorbide dinitrate (ISORDIL) 10 MG tablet Take 1 tablet (10 mg total) by mouth 3 (three) times daily. 90 tablet 3  .  LORazepam (ATIVAN) 1 MG tablet Take 1 tablet (1 mg total) by mouth daily as needed for anxiety. 30 tablet 0  . multivitamin (RENA-VIT) TABS tablet Take 1 tablet by mouth daily. 90 tablet 3  . oxyCODONE (ROXICODONE) 5 MG immediate release tablet Take 1 tablet (5 mg total) by mouth every 4 (four) hours as needed for severe pain. 90 tablet 0  . sertraline (ZOLOFT) 100 MG tablet Take 1 tablet (100 mg total) by mouth daily. 30 tablet 3  . sevelamer carbonate (RENVELA) 800 MG tablet Take 2-3 tablets (1,600-2,400 mg total) by mouth 3 (three) times daily with meals. 3 tabs with meals, 2 tabs with snacks 120 tablet 3   No current facility-administered medications for this visit.    Allergies:   Aspirin; Tramadol; Vicodin; Buprenorphine hcl; Iohexol; and Morphine and related   Social History:  The patient  reports that she quit smoking about 3 months ago. Her smoking use included Cigarettes. She smoked 0.00 packs per day for 1 year. She has never used smokeless tobacco. She reports that she does not drink alcohol or use illicit drugs.   Family History:  The patient's family history includes Hypertension in her father; Polycystic kidney disease in her father.    ROS:  Please see the history of present illness.   Otherwise, review of systems is positive for none.   All other systems are reviewed and negative.    PHYSICAL EXAM: VS:  BP 160/110 mmHg  Pulse 86  Ht 5\' 3"  (1.6 m)  Wt 119 lb 3.2 oz (54.069 kg)  BMI 21.12 kg/m2 , BMI Body mass index is 21.12 kg/(m^2). GEN: Well nourished, well developed, in no acute distress HEENT: normal Neck: no JVD, carotid bruits, or masses Cardiac: RRR; no murmurs, rubs, or gallops,no edema  Respiratory:  clear to auscultation bilaterally, normal work of breathing GI: soft, nontender, nondistended, + BS MS: no deformity or atrophy Skin: warm and dry,  device pocket is well healed Neuro:  Strength and sensation are intact Psych: euthymic mood, full affect  EKG:   EKG is ordered today. The ekg ordered today shows sinus rhythm, LVH with repol abnormalities   Device interrogation is reviewed today in detail.  See PaceArt for details.   Recent Labs: 04/02/2015: Magnesium 2.4 04/03/2015: TSH 10.706* 09/29/2015: B Natriuretic Peptide 2492.9* 02/20/2016: ALT 9* 03/16/2016: BUN 50*; Creatinine, Ser 6.96*; Hemoglobin 10.7*; Platelets 180; Potassium 4.3; Sodium 138    Lipid Panel     Component Value Date/Time   CHOL 188 04/03/2015 0313   TRIG 167* 04/03/2015 0313   HDL 51 04/03/2015 0313   CHOLHDL 3.7 04/03/2015 0313   VLDL 33 04/03/2015 0313   LDLCALC 104* 04/03/2015 0313     Wt Readings from Last 3 Encounters:  03/21/16 119 lb 3.2 oz (54.069 kg)  03/16/16 119 lb 7.8 oz (54.2 kg)  03/09/16 127  lb 14.4 oz (58.015 kg)      Other studies Reviewed: Additional studies/ records that were reviewed today include: TTE 10/29/15  Review of the above records today demonstrates:  - Left ventricle: The cavity size was moderately dilated. Wall  thickness was normal. Systolic function was severely reduced. The  estimated ejection fraction was in the range of 20% to 25%.  Diffuse hypokinesis. - Mitral valve: There was mild regurgitation. Valve area by  continuity equation (using LVOT flow): 1.74 cm^2. - Left atrium: The atrium was mildly dilated. - Right atrium: The atrium was mildly dilated. - Atrial septum: No defect or patent foramen ovale was identified. - Impressions: Small posterior and lateral effusion on tamponade.   ASSESSMENT AND PLAN:  1.  Nonischemic cardiomyopathy: [employer] scientific ICD in place. Device interrogation shows no episodes of tachycardia. She is currently not on optimal medical therapy. She does take carvedilol and Imdur, but is not on hydralazine. Due to that, we'll start her on 25 mg 3 times a day of hydralazine. We'll decrease her Norvasc to 5 mg daily, with hopes of being able to wean off of this. We Glynn Yepes also increase her  carvedilol to 12.5 mg twice daily, as she has had elevated blood pressures.  2. Chronic kidney disease: Currently on dialysis with a left upper extremity fistula. The fistula had clotted; she has planned operation tomorrow to reroute the fistula. This is a intermediate risk procedure, and she would be at intermediate risk for complications. Would work to continue her carvedilol through the time of the procedure to hopefully decrease her cardiac risks. Would also work to try and keep her in's and outs relatively even through the procedure.    Current medicines are reviewed at length with the patient today.   The patient does not have concerns regarding her medicines.  The following changes were made today:  Decrease norvasc to 5 mg, start hydralazine 25 mg TID, increase coreg to 12.5 mg BID  Labs/ tests ordered today include:  No orders of the defined types were placed in this encounter.     Disposition:   FU with [name] Small 3 months  Signed, [name] Lowden Jorja Loa, MD  03/21/2016 1:53 PM     [facility] [address] [place] [address] [phone] (office) [phone] (fax)

## 2016-03-22 ENCOUNTER — Ambulatory Visit: Payer: Medicare Other | Admitting: Anesthesiology

## 2016-03-22 ENCOUNTER — Encounter
Admission: RE | Disposition: A | Discharge status: Home / Self Care | Payer: Self-pay | Source: Ambulatory Visit | Attending: Vascular Surgery

## 2016-03-22 ENCOUNTER — Ambulatory Visit
Admission: RE | Admit: 2016-03-22 | Discharge: 2016-03-22 | Disposition: A | Discharge status: Home / Self Care | Payer: Medicare Other | Source: Ambulatory Visit | Attending: Vascular Surgery | Admitting: Vascular Surgery

## 2016-03-22 ENCOUNTER — Encounter: Payer: Self-pay | Admitting: *Deleted

## 2016-03-22 DIAGNOSIS — Z885 Allergy status to narcotic agent status: Secondary | ICD-10-CM | POA: Diagnosis not present

## 2016-03-22 DIAGNOSIS — Z87891 Personal history of nicotine dependence: Secondary | ICD-10-CM | POA: Diagnosis not present

## 2016-03-22 DIAGNOSIS — I728 Aneurysm of other specified arteries: Secondary | ICD-10-CM | POA: Insufficient documentation

## 2016-03-22 DIAGNOSIS — Z905 Acquired absence of kidney: Secondary | ICD-10-CM | POA: Diagnosis not present

## 2016-03-22 DIAGNOSIS — I429 Cardiomyopathy, unspecified: Secondary | ICD-10-CM | POA: Diagnosis not present

## 2016-03-22 DIAGNOSIS — X58XXXA Exposure to other specified factors, initial encounter: Secondary | ICD-10-CM | POA: Insufficient documentation

## 2016-03-22 DIAGNOSIS — T82898A Other specified complication of vascular prosthetic devices, implants and grafts, initial encounter: Secondary | ICD-10-CM | POA: Insufficient documentation

## 2016-03-22 DIAGNOSIS — D571 Sickle-cell disease without crisis: Secondary | ICD-10-CM | POA: Diagnosis not present

## 2016-03-22 DIAGNOSIS — T8611 Kidney transplant rejection: Secondary | ICD-10-CM | POA: Insufficient documentation

## 2016-03-22 DIAGNOSIS — E875 Hyperkalemia: Secondary | ICD-10-CM | POA: Diagnosis not present

## 2016-03-22 DIAGNOSIS — F329 Major depressive disorder, single episode, unspecified: Secondary | ICD-10-CM | POA: Insufficient documentation

## 2016-03-22 DIAGNOSIS — N186 End stage renal disease: Secondary | ICD-10-CM | POA: Diagnosis not present

## 2016-03-22 DIAGNOSIS — I120 Hypertensive chronic kidney disease with stage 5 chronic kidney disease or end stage renal disease: Secondary | ICD-10-CM | POA: Diagnosis present

## 2016-03-22 DIAGNOSIS — Z992 Dependence on renal dialysis: Secondary | ICD-10-CM | POA: Diagnosis not present

## 2016-03-22 DIAGNOSIS — F419 Anxiety disorder, unspecified: Secondary | ICD-10-CM | POA: Insufficient documentation

## 2016-03-22 DIAGNOSIS — I509 Heart failure, unspecified: Secondary | ICD-10-CM | POA: Insufficient documentation

## 2016-03-22 DIAGNOSIS — Z86711 Personal history of pulmonary embolism: Secondary | ICD-10-CM | POA: Insufficient documentation

## 2016-03-22 DIAGNOSIS — I132 Hypertensive heart and chronic kidney disease with heart failure and with stage 5 chronic kidney disease, or end stage renal disease: Secondary | ICD-10-CM | POA: Insufficient documentation

## 2016-03-22 DIAGNOSIS — Z886 Allergy status to analgesic agent status: Secondary | ICD-10-CM | POA: Insufficient documentation

## 2016-03-22 DIAGNOSIS — K219 Gastro-esophageal reflux disease without esophagitis: Secondary | ICD-10-CM | POA: Diagnosis not present

## 2016-03-22 DIAGNOSIS — Z95810 Presence of automatic (implantable) cardiac defibrillator: Secondary | ICD-10-CM | POA: Diagnosis not present

## 2016-03-22 DIAGNOSIS — Q613 Polycystic kidney, unspecified: Secondary | ICD-10-CM | POA: Insufficient documentation

## 2016-03-22 HISTORY — PX: REVISON OF ARTERIOVENOUS FISTULA: SHX6074

## 2016-03-22 LAB — POTASSIUM: POTASSIUM: 4.8 mmol/L (ref 3.5–5.1)

## 2016-03-22 LAB — HCG, QUANTITATIVE, PREGNANCY: HCG, BETA CHAIN, QUANT, S: 45 m[IU]/mL — AB (ref <5)

## 2016-03-22 SURGERY — REVISON OF ARTERIOVENOUS FISTULA
Anesthesia: General | Site: Arm Upper | Laterality: Left | Wound class: Clean

## 2016-03-22 MED ORDER — MIDAZOLAM HCL 5 MG/5ML IJ SOLN
2.0000 mg | Freq: Once | INTRAMUSCULAR | Status: AC
  Administered 2016-03-22: 2 mg via INTRAVENOUS

## 2016-03-22 MED ORDER — FENTANYL CITRATE (PF) 100 MCG/2ML IJ SOLN
25.0000 ug | INTRAMUSCULAR | Status: AC | PRN
  Administered 2016-03-22 (×6): 25 ug via INTRAVENOUS

## 2016-03-22 MED ORDER — FENTANYL CITRATE (PF) 100 MCG/2ML IJ SOLN
INTRAMUSCULAR | Status: DC | PRN
  Administered 2016-03-22: 25 ug via INTRAVENOUS
  Administered 2016-03-22: 50 ug via INTRAVENOUS
  Administered 2016-03-22: 25 ug via INTRAVENOUS

## 2016-03-22 MED ORDER — BUPIVACAINE-EPINEPHRINE (PF) 0.5% -1:200000 IJ SOLN
INTRAMUSCULAR | Status: AC
  Filled 2016-03-22: qty 30

## 2016-03-22 MED ORDER — FENTANYL CITRATE (PF) 100 MCG/2ML IJ SOLN
INTRAMUSCULAR | Status: AC
  Filled 2016-03-22: qty 2

## 2016-03-22 MED ORDER — MIDAZOLAM HCL 5 MG/5ML IJ SOLN
INTRAMUSCULAR | Status: AC
  Filled 2016-03-22: qty 5

## 2016-03-22 MED ORDER — PROPOFOL 10 MG/ML IV BOLUS
INTRAVENOUS | Status: DC | PRN
  Administered 2016-03-22: 50 mg via INTRAVENOUS
  Administered 2016-03-22: 100 mg via INTRAVENOUS

## 2016-03-22 MED ORDER — OXYCODONE-ACETAMINOPHEN 7.5-325 MG PO TABS
ORAL_TABLET | ORAL | Status: AC
  Filled 2016-03-22: qty 1

## 2016-03-22 MED ORDER — FAMOTIDINE 20 MG PO TABS
20.0000 mg | ORAL_TABLET | Freq: Once | ORAL | Status: AC
  Administered 2016-03-22: 20 mg via ORAL

## 2016-03-22 MED ORDER — SODIUM CHLORIDE 0.9 % IV SOLN
INTRAVENOUS | Status: DC | PRN
  Administered 2016-03-22: 5 mL via INTRAMUSCULAR

## 2016-03-22 MED ORDER — HEPARIN SODIUM (PORCINE) 5000 UNIT/ML IJ SOLN
INTRAMUSCULAR | Status: AC
  Filled 2016-03-22: qty 1

## 2016-03-22 MED ORDER — ONDANSETRON HCL 4 MG/2ML IJ SOLN: 4.0000 mg | Freq: Once | INTRAMUSCULAR | Status: DC | PRN

## 2016-03-22 MED ORDER — HEPARIN SODIUM (PORCINE) 1000 UNIT/ML IJ SOLN
INTRAMUSCULAR | Status: DC | PRN
  Administered 2016-03-22: 3000 [IU] via INTRAVENOUS

## 2016-03-22 MED ORDER — HYDROMORPHONE HCL 1 MG/ML IJ SOLN
1.0000 mg | Freq: Once | INTRAMUSCULAR | Status: AC
  Administered 2016-03-22: 1 mg via INTRAVENOUS

## 2016-03-22 MED ORDER — OXYCODONE-ACETAMINOPHEN 7.5-325 MG PO TABS
1.0000 | ORAL_TABLET | ORAL | Status: DC | PRN
  Administered 2016-03-22: 1 via ORAL

## 2016-03-22 MED ORDER — SODIUM CHLORIDE 0.9 % IJ SOLN
INTRAMUSCULAR | Status: AC
  Filled 2016-03-22: qty 20

## 2016-03-22 MED ORDER — PAPAVERINE HCL 30 MG/ML IJ SOLN
INTRAMUSCULAR | Status: AC
  Filled 2016-03-22: qty 2

## 2016-03-22 MED ORDER — ACETAMINOPHEN 325 MG PO TABS
ORAL_TABLET | ORAL | Status: AC
  Filled 2016-03-22: qty 1

## 2016-03-22 MED ORDER — MIDAZOLAM HCL 2 MG/2ML IJ SOLN
INTRAMUSCULAR | Status: DC | PRN
  Administered 2016-03-22: 2 mg via INTRAVENOUS

## 2016-03-22 MED ORDER — HYDROMORPHONE HCL 1 MG/ML IJ SOLN
INTRAMUSCULAR | Status: AC
  Filled 2016-03-22: qty 1

## 2016-03-22 MED ORDER — CEFAZOLIN SODIUM-DEXTROSE 2-4 GM/100ML-% IV SOLN
2.0000 g | INTRAVENOUS | Status: AC
  Administered 2016-03-22: 2 g via INTRAVENOUS

## 2016-03-22 MED ORDER — SODIUM CHLORIDE 0.9 % IV SOLN
INTRAVENOUS | Status: DC
  Administered 2016-03-22: 12:00:00 via INTRAVENOUS

## 2016-03-22 MED ORDER — SODIUM CHLORIDE 0.9 % IV SOLN
INTRAVENOUS | Status: DC | PRN
  Administered 2016-03-22: 30 mL via INTRAMUSCULAR

## 2016-03-22 MED ORDER — CEFAZOLIN SODIUM-DEXTROSE 2-4 GM/100ML-% IV SOLN
INTRAVENOUS | Status: AC
  Administered 2016-03-22: 2 g via INTRAVENOUS
  Filled 2016-03-22: qty 100

## 2016-03-22 MED ORDER — OXYCODONE-ACETAMINOPHEN 7.5-325 MG PO TABS: 1.0000 | ORAL_TABLET | ORAL | Status: DC | PRN

## 2016-03-22 MED ORDER — EVICEL 2 ML EX KIT
PACK | CUTANEOUS | Status: AC
  Filled 2016-03-22: qty 1

## 2016-03-22 MED ORDER — PHENYLEPHRINE HCL 10 MG/ML IJ SOLN
INTRAMUSCULAR | Status: DC | PRN
  Administered 2016-03-22: 300 ug via INTRAVENOUS
  Administered 2016-03-22: 100 ug via INTRAVENOUS
  Administered 2016-03-22 (×2): 300 ug via INTRAVENOUS
  Administered 2016-03-22: 200 ug via INTRAVENOUS
  Administered 2016-03-22: 300 ug via INTRAVENOUS
  Administered 2016-03-22 (×3): 200 ug via INTRAVENOUS

## 2016-03-22 MED ORDER — ESMOLOL HCL 100 MG/10ML IV SOLN
INTRAVENOUS | Status: DC | PRN
  Administered 2016-03-22: 20 mg via INTRAVENOUS

## 2016-03-22 MED ORDER — MIDAZOLAM HCL 5 MG/5ML IJ SOLN
1.0000 mg | Freq: Once | INTRAMUSCULAR | Status: AC
  Administered 2016-03-22: 1 mg via INTRAVENOUS

## 2016-03-22 MED ORDER — PHENYLEPHRINE HCL 10 MG/ML IJ SOLN
10000.0000 ug | INTRAMUSCULAR | Status: DC | PRN
  Administered 2016-03-22: 50 ug/min via INTRAVENOUS

## 2016-03-22 MED ORDER — ONDANSETRON HCL 4 MG/2ML IJ SOLN
INTRAMUSCULAR | Status: DC | PRN
  Administered 2016-03-22: 4 mg via INTRAVENOUS

## 2016-03-22 MED ORDER — FAMOTIDINE 20 MG PO TABS
ORAL_TABLET | ORAL | Status: AC
  Administered 2016-03-22: 20 mg via ORAL
  Filled 2016-03-22: qty 1

## 2016-03-22 MED ORDER — LIDOCAINE HCL (CARDIAC) 20 MG/ML IV SOLN
INTRAVENOUS | Status: DC | PRN
  Administered 2016-03-22: 80 mg via INTRAVENOUS

## 2016-03-22 MED ORDER — ACETAMINOPHEN 325 MG PO TABS
325.0000 mg | ORAL_TABLET | Freq: Once | ORAL | Status: AC
  Administered 2016-03-22: 325 mg via ORAL

## 2016-03-22 SURGICAL SUPPLY — 53 items
BAG DECANTER FOR FLEXI CONT (MISCELLANEOUS) ×2 IMPLANT
BLADE SURG SZ11 CARB STEEL (BLADE) ×2 IMPLANT
BOOT SUTURE AID YELLOW STND (SUTURE) ×2 IMPLANT
BRUSH SCRUB 4% CHG (MISCELLANEOUS) ×2 IMPLANT
CANISTER SUCT 1200ML W/VALVE (MISCELLANEOUS) ×2 IMPLANT
CHLORAPREP W/TINT 26ML (MISCELLANEOUS) ×2 IMPLANT
CLIP SPRNG 6MM S-JAW DBL (CLIP) ×2
ELECT CAUTERY BLADE 6.4 (BLADE) ×2 IMPLANT
ELECT REM PT RETURN 9FT ADLT (ELECTROSURGICAL) ×2
ELECTRODE REM PT RTRN 9FT ADLT (ELECTROSURGICAL) ×1 IMPLANT
EVICEL 2ML SEALANT HUMAN (Miscellaneous) ×2 IMPLANT
GEL ULTRASOUND 20GR AQUASONIC (MISCELLANEOUS) IMPLANT
GLOVE BIO SURGEON STRL SZ7 (GLOVE) ×12 IMPLANT
GLOVE INDICATOR 7.5 STRL GRN (GLOVE) IMPLANT
GOWN STRL REUS W/ TWL LRG LVL3 (GOWN DISPOSABLE) ×2 IMPLANT
GOWN STRL REUS W/ TWL XL LVL3 (GOWN DISPOSABLE) ×1 IMPLANT
GOWN STRL REUS W/TWL LRG LVL3 (GOWN DISPOSABLE) ×2
GOWN STRL REUS W/TWL XL LVL3 (GOWN DISPOSABLE) ×1
GRAFT COLLAGEN VASCULAR 8X40 (Vascular Products) ×2 IMPLANT
HEMOSTAT SURGICEL 2X3 (HEMOSTASIS) ×2 IMPLANT
IV NS 500ML (IV SOLUTION) ×1
IV NS 500ML BAXH (IV SOLUTION) ×1 IMPLANT
KIT RM TURNOVER STRD PROC AR (KITS) ×2 IMPLANT
LABEL OR SOLS (LABEL) ×2 IMPLANT
LIQUID BAND (GAUZE/BANDAGES/DRESSINGS) ×2 IMPLANT
LOOP RED MAXI  1X406MM (MISCELLANEOUS) ×1
LOOP VESSEL MAXI 1X406 RED (MISCELLANEOUS) ×1 IMPLANT
LOOP VESSEL MINI 0.8X406 BLUE (MISCELLANEOUS) ×1 IMPLANT
LOOPS BLUE MINI 0.8X406MM (MISCELLANEOUS) ×1
NEEDLE FILTER BLUNT 18X 1/2SAF (NEEDLE) ×2
NEEDLE FILTER BLUNT 18X1 1/2 (NEEDLE) ×2 IMPLANT
NEEDLE HYPO 30X.5 LL (NEEDLE) IMPLANT
NS IRRIG 500ML POUR BTL (IV SOLUTION) ×2 IMPLANT
PACK EXTREMITY ARMC (MISCELLANEOUS) ×2 IMPLANT
PAD PREP 24X41 OB/GYN DISP (PERSONAL CARE ITEMS) ×2 IMPLANT
SOLUTION CELL SAVER (CLIP) ×1 IMPLANT
STOCKINETTE STRL 4IN 9604848 (GAUZE/BANDAGES/DRESSINGS) ×2 IMPLANT
SUT MNCRL AB 4-0 PS2 18 (SUTURE) ×2 IMPLANT
SUT PROLENE 6 0 BV (SUTURE) ×10 IMPLANT
SUT SILK 2 0 (SUTURE) ×1
SUT SILK 2 0 SH (SUTURE) ×4 IMPLANT
SUT SILK 2-0 18XBRD TIE 12 (SUTURE) ×1 IMPLANT
SUT SILK 3 0 (SUTURE) ×1
SUT SILK 3-0 18XBRD TIE 12 (SUTURE) ×1 IMPLANT
SUT SILK 4 0 (SUTURE) ×1
SUT SILK 4-0 18XBRD TIE 12 (SUTURE) ×1 IMPLANT
SUT VIC AB 3-0 SH 27 (SUTURE) ×1
SUT VIC AB 3-0 SH 27X BRD (SUTURE) ×1 IMPLANT
SYR 20CC LL (SYRINGE) ×2 IMPLANT
SYR 3ML LL SCALE MARK (SYRINGE) ×2 IMPLANT
SYR TB 1ML 27GX1/2 LL (SYRINGE) IMPLANT
SYRINGE IRR TOOMEY STRL 70CC (SYRINGE) ×2 IMPLANT
TOWEL OR 17X26 4PK STRL BLUE (TOWEL DISPOSABLE) IMPLANT

## 2016-03-22 NOTE — H&P (Signed)
[facility] Admission History & Physical  MRN : [id number]  [name] Small is a 25 y.o. (1991/10/15) female who presents with chief complaint of No chief complaint on file. Marland Kitchen  History of Present Illness: Patient is a 25 year old female with long-standing end-stage renal disease. 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 Patient has pain at the site. She has no fevers or chills or signs systemic infection. She had a PermCath placed which is working fine for her dialysis for the time being.  Current Facility-Administered Medications  Medication Dose Route Frequency Provider Last Rate Last Dose  . 0.9 %  sodium chloride infusion   Intravenous Continuous Yevette Edwards, MD 50 mL/hr at 03/22/16 1142    . ceFAZolin (ANCEF) 2-4 GM/100ML-% IVPB           . ceFAZolin (ANCEF) IVPB 2g/100 mL premix  2 g Intravenous On Call to OR Tonette Lederer, PA-C        Past Medical History  Diagnosis Date  . Hemodialysis patient (HCC)     Mon. Wed. Fri  . Hypertension   . Pulmonary emboli (HCC) 01/2012    Bilateral, moderate clot burden, areas of pulmonary infarction and central necrosis  . CHF (congestive heart failure) (HCC)   . Cardiomyopathy   . Dysrhythmia     at times per pt.  . Anemia   . H/O transfusion of packed red blood cells   . 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 Polycystic kidney disease   . Cellulitis and abscess of face 03/22/2013  . Renal insufficiency   . Chronic pain   . GERD (gastroesophageal reflux disease)   . Depression   . Hyperkalemia 09/2015  . Pelvic fracture (HCC)   . Osteoporosis   . Sickle cell anemia (HCC)   . Narcotic abuse, continuous   . 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 Anxiety   .  Sickle cell anemia with crisis [facility]) March 2017    Past Surgical History  Procedure Laterality Date  . Nephrectomy Right     third kidney placed in 2008, and body rejected in 2012   . Av fistula placement    . Kidney transplant  2008    failed  . Tonsillectomy      as a child.  . Adenoidectomy    . Incision and drainage abscess Right 03/21/2013    Procedure: INCISION AND DRAINAGE RIGHT CHEEK ABSCESS REMOVAL OF FOREIGN BODY;  Surgeon: Serena Colonel, MD;  Location: [facility] OR;  Service: ENT;  Laterality: Right;  . Implantable cardioverter defibrillator implant Right 12/2014  . Peripheral vascular catheterization Left 03/09/2016    Procedure: A/V Shuntogram/Fistulagram;  Surgeon: Annice Needy, MD;  Location: [facility] INVASIVE CV LAB;  Service: Cardiovascular;  Laterality: Left;  . Insertion of dialysis catheter Right Mar 09, 2016    thigh, Dr. Wyn Quaker, [facility]  . Cardiac catheterization      Social History Social History  Substance Use Topics  . Smoking status: Former Smoker -- 0.00 packs/day for 1 years    Types: Cigarettes    Quit date: 12/08/2015  . Smokeless tobacco: Never Used  . Alcohol Use: No  No IV drug use  Family History Family History  Problem Relation Age of Onset  . Polycystic  kidney disease Father   . Hypertension Father   No bleeding or clotting disorders  Allergies  Allergen Reactions  . Aspirin Other (See Comments)    Interacts with Coreg  . Tramadol Anaphylaxis  . Vicodin [Hydrocodone-Acetaminophen] Hives  . Buprenorphine Hcl Itching    Ok with oxycodone  . Iohexol Itching  . Morphine And Related Itching    Ok with oxycodone     REVIEW OF SYSTEMS (Negative unless checked)  Constitutional: [] Weight loss  [] Fever  [] Chills Cardiac: [] Chest pain   [] Chest pressure   [] Palpitations   [] Shortness of breath when laying flat   [] Shortness of breath at rest   [] Shortness of breath with exertion. Vascular:  [] Pain in legs with walking   [] Pain in legs at rest   [] Pain in  legs when laying flat   [] Claudication   [] Pain in feet when walking  [] Pain in feet at rest  [] Pain in feet when laying flat   [] History of DVT   [] Phlebitis   [] Swelling in legs   [] Varicose veins   [] Non-healing ulcers Pulmonary:   [] Uses home oxygen   [] Productive cough   [] Hemoptysis   [] Wheeze  [] COPD   [] Asthma Neurologic:  [] Dizziness  [] Blackouts   [] Seizures   [] History of stroke   [] History of TIA  [] Aphasia   [] Temporary blindness   [] Dysphagia   [] Weakness or numbness in arms   [] Weakness or numbness in legs Musculoskeletal:  [] Arthritis   [] Joint swelling   [] Joint pain   [] Low back pain Hematologic:  [] Easy bruising  [] Easy bleeding   [] Hypercoagulable state   [] Anemic  [] Hepatitis Gastrointestinal:  [] Blood in stool   [] Vomiting blood  [] Gastroesophageal reflux/heartburn   [] Difficulty swallowing. Genitourinary:  [x] Chronic kidney disease   [] Difficult urination  [] Frequent urination  [] Burning with urination   [] Blood in urine Skin:  [] Rashes   [] Ulcers   [] Wounds Psychological:  [] History of anxiety   []  History of major depression.  Physical Examination  Filed Vitals:   03/22/16 1113  BP: 162/110  Pulse: 73  Temp: 98.7 F (37.1 C)  TempSrc: Oral  Resp: 16  Height: 5\' 3"  (1.6 m)  Weight: 53.978 kg (119 lb)  SpO2: 100%   Body mass index is 21.09 kg/(m^2). Gen: WD/WN, NAD Head: [place]/AT, No temporalis wasting. Prominent temp pulse not noted. Ear/Nose/Throat: Hearing grossly intact, nares w/o erythema or drainage, oropharynx w/o Erythema/Exudate,  Eyes: PERRLA, EOMI.  Neck: Supple, no nuchal rigidity.  No JVD.  Pulmonary:  Good air movement, no use of accessory muscles.  Cardiac: RRR, normal S1, S2 Vascular: Throat and bruit present in the left upper arm AV fistula with marked aneurysmal degeneration. PermCath in place in the groin without erythema or drainage. Vessel Right Left  Radial Palpable Palpable                                   Gastrointestinal: soft,  non-tender/non-distended. No guarding/reflex.  Musculoskeletal: M/S 5/5 throughout.  Extremities without ischemic changes.  No deformity or atrophy.  Neurologic: CN 2-12 intact. Pain and light touch intact in extremities.  Symmetrical.  Speech is fluent. Motor exam as listed above. Psychiatric: Judgment intact, Mood & affect appropriate for pt's clinical situation. Dermatologic: No rashes or ulcers noted.  No cellulitis or open wounds. Lymph : No Cervical, Axillary, or Inguinal lymphadenopathy.      CBC Lab Results  Component Value Date   WBC  5.0 03/16/2016   HGB 10.7* 03/16/2016   HCT 33.7* 03/16/2016   MCV 92.5 03/16/2016   PLT 180 03/16/2016    BMET    Component Value Date/Time   NA 138 03/16/2016 1422   K 4.3 03/16/2016 1422   CL 97* 03/16/2016 1422   CO2 27 03/16/2016 1422   GLUCOSE 77 03/16/2016 1422   BUN 50* 03/16/2016 1422   CREATININE 6.96* 03/16/2016 1422   CALCIUM 9.9 03/16/2016 1422   GFRNONAA 7* 03/16/2016 1422   GFRAA 9* 03/16/2016 1422   Estimated Creatinine Clearance: 10.3 mL/min (by C-G formula based on Cr of 6.96).  COAG Lab Results  Component Value Date   INR 1.21 03/16/2016   INR 1.51* 06/29/2015   INR 1.36 01/26/2015    Radiology Dg Chest 2 View  03/06/2016  CLINICAL DATA:  Chest pain that began yesterday, sharp pain, history end-stage renal disease on dialysis, pulmonary embolism, CHF, cardiomyopathy, sickle cell anemia, former smoker, failed renal transplant EXAM: CHEST  2 VIEW COMPARISON:  02/20/2016 FINDINGS: RIGHT subclavian AICD lead projects at RIGHT ventricle. Significant enlargement of cardiac silhouette with pulmonary vascular congestion. Mediastinal contours normal. Lungs hyperinflated but clear. No pleural effusion or pneumothorax. No acute osseous findings. IMPRESSION: Enlargement of cardiac silhouette with pulmonary vascular congestion post AICD. Hyperinflated lungs without infiltrate. Electronically Signed   By: Ulyses Southward M.D.    On: 03/06/2016 21:41      Assessment/Plan 1. Dysfunctional dialysis access with marked aneurysmal degeneration and pain. Plan for surgical revision today. Risks and benefits were discussed. PermCath has already been placed. 2. End-stage renal disease. Long-standing. Multiple previous access placements have been done. 3. Cardiac arrhythmias. Status post AICD placement.   DEW,JASON, MD  03/22/2016 12:01 PM

## 2016-03-22 NOTE — Discharge Instructions (Signed)

## 2016-03-22 NOTE — Op Note (Signed)
[facility]    OPERATIVE NOTE   PROCEDURE: 1. Jump graft revision of left brachiobasilic arteriovenous fistula with 7 mm Artegraft 2. Ligation of aneurysmal left brachiobasilic AV fistula  PRE-OPERATIVE DIAGNOSIS: 1. aneurysmal degeneration of arteriovenous fistula  2. ESRD  POST-OPERATIVE DIAGNOSIS: same as above   SURGEON: Festus Barren, MD  ASSISTANT(S): None  ANESTHESIA: General  ESTIMATED BLOOD LOSS: 50 cc  FINDING(S): 1. Left brachiobasilic AV fistula aneurysm 2.  palpable thrill at end of the case  SPECIMEN(S):  None  INDICATIONS:   [name] Small is a 25 y.o. female who  presents with aneurysmal degeneration of left arm arteriovenous access.  In order to salvage the fistula and decrease the bleeding complication risks, I recommended a jump graft revision with ligation of the access.  Risk, benefits, and alternatives to access surgery were discussed.  The patient is aware the risks include but are not limited to: bleeding, infection, steal syndrome, nerve damage, ischemic monomelic neuropathy, loss of the access, need for additional procedures, death and stroke.  The patient agrees to proceed forward with the procedure.  DESCRIPTION: After obtaining full informed written consent, the patient was brought back to the operating room and placed supine upon the operating table.  The patient received IV antibiotics prior to induction.  After obtaining adequate anesthesia, the patient was prepped and draped in the standard fashion for the access procedure.  As incision was created near the arterial anastomosis prior to the aneurysmal segment.  The access was encircled with vessel loops and prepared for control.  I then created an incision in the proximal arm beyond the aneurysmal segment essentially in the axilla and encircled the axillary vein for control with a vessel loop.  I then used the tunneller and tunnelled between the two incisions around the old access.   I brought a 7 mm Artegraft through the tunneller making sure to avoid twisting after marking for orientation.  The patient was then given 3000 units of intravenous heparin.  The access was then controlled proximally with a DeBakey vascular clamp and clamped and ligated distally with a silk suture ligature. The access was ligated to exclude the aneurysm from flow. I prepared the end nearer the original arterial anastomosis for an anastomosis with the new 7 mm Artegraft.  The anastomosis was created in an end to end fashion with two 6-0 Prolene sutures in the typical fashion.  I then flushed through the new graft and prepared this for the distal anastomosis.  The access was then divided and ligated again with a silk suture ligature and what was the basilic vein near the axillary vein junction.  The distal end was then prepared for anastomosis with the new graft.  An anastomosis was then created with 6-0 Prolene sutures in the usual fashion in an inside fashion with the Artegraft being the infarct in the axillary vein being the side part.  The graft was flushed and de-aired prior to release of control.  Patch sutures with 6-0 Prolene sutures were used as needed for control of bleeding.  Surgicel and Evicel topical hemostatic agents were placed and hemostasis was complete.  I then closed the wound with 3-0 Vicryl suture in the subcutaneous space and a 4-0 Monocryl suture was used to close the skin.  Dermabond was placed as a dressing.  The patient was then awakened from anesthesia and taken to the recovery room in stable condition having tolerated the procedure well.    COMPLICATIONS: none  CONDITION:  stable  [name] Small  03/22/2016, 3:46 PM

## 2016-03-22 NOTE — Anesthesia Procedure Notes (Signed)
Procedure Name: LMA Insertion Date/Time: 03/22/2016 1:39 PM Performed by: Michaele Offer Pre-anesthesia Checklist: Patient identified, Emergency Drugs available, Suction available, Patient being monitored and Timeout performed Patient Re-evaluated:Patient Re-evaluated prior to inductionOxygen Delivery Method: Circle system utilized Preoxygenation: Pre-oxygenation with 100% oxygen Intubation Type: IV induction Ventilation: Mask ventilation without difficulty LMA: LMA inserted LMA Size: 4.0 Number of attempts: 1

## 2016-03-22 NOTE — Transfer of Care (Signed)
Immediate Anesthesia Transfer of Care Note  Patient: [name] Small  Procedure(s) Performed: Procedure(s): REVISON OF ARTERIOVENOUS FISTULA ( ARTEGRAFT ) (Left)  Patient Location: PACU  Anesthesia Type:General  Level of Consciousness: awake, alert , oriented and patient cooperative  Airway & Oxygen Therapy: Patient Spontanous Breathing and Patient connected to face mask oxygen  Post-op Assessment: Report given to RN, Post -op Vital signs reviewed and stable and Patient moving all extremities X 4  Post vital signs: Reviewed and stable  Last Vitals:  Filed Vitals:   03/22/16 1113 03/22/16 1540  BP: 162/110 103/81  Pulse: 73 96  Temp: 37.1 C   Resp: 16 17    Last Pain: There were no vitals filed for this visit.       Complications: No apparent anesthesia complications

## 2016-03-22 NOTE — Anesthesia Preprocedure Evaluation (Addendum)
Anesthesia Evaluation  Patient identified by MRN, date of birth, ID band Patient awake    Reviewed: Allergy & Precautions, NPO status , Patient's Chart, lab work & pertinent test results, reviewed documented beta blocker date and time   Airway Mallampati: II  TM Distance: >3 FB     Dental  (+) Chipped   Pulmonary former smoker, PE PE Hx   Pulmonary exam normal        Cardiovascular hypertension, Pt. on medications and Pt. on home beta blockers +CHF  Normal cardiovascular exam+ dysrhythmias + Cardiac Defibrillator      Neuro/Psych Anxiety Depression    GI/Hepatic Neg liver ROS, GERD  Medicated and Controlled,  Endo/Other  negative endocrine ROS  Renal/GU ESRF and DialysisRenal disease  negative genitourinary   Musculoskeletal   Abdominal Normal abdominal exam  (+)   Peds  Hematology  (+) anemia ,   Anesthesia Other Findings   Reproductive/Obstetrics                            Anesthesia Physical Anesthesia Plan  ASA: IV  Anesthesia Plan: General   Post-op Pain Management:    Induction: Intravenous  Airway Management Planned: LMA  Additional Equipment:   Intra-op Plan:   Post-operative Plan: Extubation in OR  Informed Consent: I have reviewed the patients History and Physical, chart, labs and discussed the procedure including the risks, benefits and alternatives for the proposed anesthesia with the patient or authorized representative who has indicated his/her understanding and acceptance.   Dental advisory given  Plan Discussed with: CRNA and Surgeon  Anesthesia Plan Comments:         Anesthesia Quick Evaluation

## 2016-03-22 NOTE — Progress Notes (Signed)
Healthcare Power of Attorney completed.

## 2016-03-23 NOTE — Anesthesia Postprocedure Evaluation (Signed)
Anesthesia Post Note  Patient: [name] Small  Procedure(s) Performed: Procedure(s) (LRB): REVISON OF ARTERIOVENOUS FISTULA ( ARTEGRAFT ) (Left)  Patient location during evaluation: PACU Anesthesia Type: General Level of consciousness: awake and alert Pain management: pain level controlled Vital Signs Assessment: post-procedure vital signs reviewed and stable Respiratory status: spontaneous breathing, nonlabored ventilation, respiratory function stable and patient connected to nasal cannula oxygen Cardiovascular status: blood pressure returned to baseline and stable Postop Assessment: no signs of nausea or vomiting Anesthetic complications: no    Last Vitals:  Filed Vitals:   03/22/16 1730 03/22/16 1845  BP: 118/70 120/66  Pulse: 90 88  Temp:    Resp: 20 20    Last Pain:  Filed Vitals:   03/22/16 1853  PainSc: 4                  Martha Soltys S

## 2016-03-31 ENCOUNTER — Inpatient Hospital Stay
Admission: EM | Admit: 2016-03-31 | Discharge: 2016-04-02 | DRG: 299 | Disposition: A | Discharge status: Home / Self Care | Payer: Medicare Other | Attending: Internal Medicine | Admitting: Internal Medicine

## 2016-03-31 ENCOUNTER — Encounter: Payer: Self-pay | Admitting: Emergency Medicine

## 2016-03-31 DIAGNOSIS — E282 Polycystic ovarian syndrome: Secondary | ICD-10-CM | POA: Diagnosis present

## 2016-03-31 DIAGNOSIS — Z95810 Presence of automatic (implantable) cardiac defibrillator: Secondary | ICD-10-CM | POA: Diagnosis not present

## 2016-03-31 DIAGNOSIS — Z86711 Personal history of pulmonary embolism: Secondary | ICD-10-CM | POA: Diagnosis not present

## 2016-03-31 DIAGNOSIS — M810 Age-related osteoporosis without current pathological fracture: Secondary | ICD-10-CM | POA: Diagnosis present

## 2016-03-31 DIAGNOSIS — Z885 Allergy status to narcotic agent status: Secondary | ICD-10-CM

## 2016-03-31 DIAGNOSIS — Z940 Kidney transplant status: Secondary | ICD-10-CM

## 2016-03-31 DIAGNOSIS — D571 Sickle-cell disease without crisis: Secondary | ICD-10-CM | POA: Diagnosis present

## 2016-03-31 DIAGNOSIS — I429 Cardiomyopathy, unspecified: Secondary | ICD-10-CM | POA: Diagnosis present

## 2016-03-31 DIAGNOSIS — T827XXA Infection and inflammatory reaction due to other cardiac and vascular devices, implants and grafts, initial encounter: Secondary | ICD-10-CM

## 2016-03-31 DIAGNOSIS — K219 Gastro-esophageal reflux disease without esophagitis: Secondary | ICD-10-CM | POA: Diagnosis present

## 2016-03-31 DIAGNOSIS — Z8249 Family history of ischemic heart disease and other diseases of the circulatory system: Secondary | ICD-10-CM

## 2016-03-31 DIAGNOSIS — D631 Anemia in chronic kidney disease: Secondary | ICD-10-CM | POA: Diagnosis present

## 2016-03-31 DIAGNOSIS — Z992 Dependence on renal dialysis: Secondary | ICD-10-CM

## 2016-03-31 DIAGNOSIS — L03114 Cellulitis of left upper limb: Secondary | ICD-10-CM | POA: Diagnosis present

## 2016-03-31 DIAGNOSIS — Z8271 Family history of polycystic kidney: Secondary | ICD-10-CM | POA: Diagnosis not present

## 2016-03-31 DIAGNOSIS — I5042 Chronic combined systolic (congestive) and diastolic (congestive) heart failure: Secondary | ICD-10-CM | POA: Diagnosis present

## 2016-03-31 DIAGNOSIS — Z841 Family history of disorders of kidney and ureter: Secondary | ICD-10-CM

## 2016-03-31 DIAGNOSIS — F329 Major depressive disorder, single episode, unspecified: Secondary | ICD-10-CM | POA: Diagnosis present

## 2016-03-31 DIAGNOSIS — Z9889 Other specified postprocedural states: Secondary | ICD-10-CM | POA: Diagnosis not present

## 2016-03-31 DIAGNOSIS — I82C12 Acute embolism and thrombosis of left internal jugular vein: Secondary | ICD-10-CM | POA: Diagnosis present

## 2016-03-31 DIAGNOSIS — N186 End stage renal disease: Secondary | ICD-10-CM | POA: Diagnosis present

## 2016-03-31 DIAGNOSIS — Z7901 Long term (current) use of anticoagulants: Secondary | ICD-10-CM | POA: Diagnosis not present

## 2016-03-31 DIAGNOSIS — I132 Hypertensive heart and chronic kidney disease with heart failure and with stage 5 chronic kidney disease, or end stage renal disease: Secondary | ICD-10-CM | POA: Diagnosis present

## 2016-03-31 DIAGNOSIS — Z79899 Other long term (current) drug therapy: Secondary | ICD-10-CM

## 2016-03-31 DIAGNOSIS — T8249XA Other complication of vascular dialysis catheter, initial encounter: Secondary | ICD-10-CM

## 2016-03-31 DIAGNOSIS — Z87891 Personal history of nicotine dependence: Secondary | ICD-10-CM | POA: Diagnosis not present

## 2016-03-31 DIAGNOSIS — N2581 Secondary hyperparathyroidism of renal origin: Secondary | ICD-10-CM | POA: Diagnosis present

## 2016-03-31 DIAGNOSIS — Z888 Allergy status to other drugs, medicaments and biological substances status: Secondary | ICD-10-CM

## 2016-03-31 DIAGNOSIS — E875 Hyperkalemia: Secondary | ICD-10-CM

## 2016-03-31 DIAGNOSIS — M7989 Other specified soft tissue disorders: Secondary | ICD-10-CM

## 2016-03-31 LAB — COMPREHENSIVE METABOLIC PANEL
ALK PHOS: 344 U/L — AB (ref 38–126)
ALT: 5 U/L — AB (ref 14–54)
AST: 26 U/L (ref 15–41)
Albumin: 3.3 g/dL — ABNORMAL LOW (ref 3.5–5.0)
Anion gap: 16 — ABNORMAL HIGH (ref 5–15)
BILIRUBIN TOTAL: 0.9 mg/dL (ref 0.3–1.2)
BUN: 104 mg/dL — AB (ref 6–20)
CHLORIDE: 100 mmol/L — AB (ref 101–111)
CO2: 20 mmol/L — ABNORMAL LOW (ref 22–32)
CREATININE: 12.76 mg/dL — AB (ref 0.44–1.00)
Calcium: 9.7 mg/dL (ref 8.9–10.3)
GFR calc Af Amer: 4 mL/min — ABNORMAL LOW (ref >60)
GFR, EST NON AFRICAN AMERICAN: 4 mL/min — AB (ref >60)
Glucose, Bld: 86 mg/dL (ref 65–99)
Potassium: 6.3 mmol/L — ABNORMAL HIGH (ref 3.5–5.1)
Sodium: 136 mmol/L (ref 135–145)
Total Protein: 7.6 g/dL (ref 6.5–8.1)

## 2016-03-31 LAB — CBC WITH DIFFERENTIAL/PLATELET
Basophils Absolute: 0.1 10*3/uL (ref 0–0.1)
Basophils Relative: 1 %
EOS ABS: 0 10*3/uL (ref 0–0.7)
EOS PCT: 0 %
HCT: 27.8 % — ABNORMAL LOW (ref 35.0–47.0)
HEMOGLOBIN: 9 g/dL — AB (ref 12.0–16.0)
LYMPHS ABS: 1.2 10*3/uL (ref 1.0–3.6)
Lymphocytes Relative: 20 %
MCH: 29.7 pg (ref 26.0–34.0)
MCHC: 32.5 g/dL (ref 32.0–36.0)
MCV: 91.4 fL (ref 80.0–100.0)
MONOS PCT: 9 %
Monocytes Absolute: 0.5 10*3/uL (ref 0.2–0.9)
Neutro Abs: 4.1 10*3/uL (ref 1.4–6.5)
Neutrophils Relative %: 70 %
Platelets: 177 10*3/uL (ref 150–440)
RBC: 3.04 MIL/uL — ABNORMAL LOW (ref 3.80–5.20)
RDW: 19.3 % — ABNORMAL HIGH (ref 11.5–14.5)
WBC: 5.9 10*3/uL (ref 3.6–11.0)

## 2016-03-31 LAB — PROTIME-INR
INR: 1.35
PROTHROMBIN TIME: 16.8 s — AB (ref 11.4–15.0)

## 2016-03-31 MED ORDER — RENA-VITE PO TABS
1.0000 | ORAL_TABLET | Freq: Every day | ORAL | Status: DC
  Administered 2016-04-01 – 2016-04-02 (×2): 1 via ORAL
  Filled 2016-03-31 (×2): qty 1

## 2016-03-31 MED ORDER — ACETAMINOPHEN 650 MG RE SUPP: 650.0000 mg | Freq: Four times a day (QID) | RECTAL | Status: DC | PRN

## 2016-03-31 MED ORDER — CALCIUM CARBONATE 1250 MG/5ML PO SUSP
500.0000 mg | Freq: Four times a day (QID) | ORAL | Status: DC | PRN
  Filled 2016-03-31 (×2): qty 5

## 2016-03-31 MED ORDER — HYDROMORPHONE HCL 1 MG/ML IJ SOLN
1.0000 mg | Freq: Once | INTRAMUSCULAR | Status: AC
  Administered 2016-03-31: 1 mg via INTRAVENOUS
  Filled 2016-03-31: qty 1

## 2016-03-31 MED ORDER — HYDRALAZINE HCL 20 MG/ML IJ SOLN
10.0000 mg | Freq: Four times a day (QID) | INTRAMUSCULAR | Status: DC | PRN
  Filled 2016-03-31: qty 1

## 2016-03-31 MED ORDER — ONDANSETRON HCL 4 MG PO TABS: 4.0000 mg | ORAL_TABLET | Freq: Four times a day (QID) | ORAL | Status: DC | PRN

## 2016-03-31 MED ORDER — CINACALCET HCL 30 MG PO TABS
30.0000 mg | ORAL_TABLET | Freq: Every day | ORAL | Status: DC
  Administered 2016-04-01 – 2016-04-02 (×2): 30 mg via ORAL
  Filled 2016-03-31 (×2): qty 1

## 2016-03-31 MED ORDER — ISOSORBIDE DINITRATE 10 MG PO TABS
10.0000 mg | ORAL_TABLET | Freq: Three times a day (TID) | ORAL | Status: DC
Start: 2016-03-31 — End: 2016-04-02
  Administered 2016-04-01 – 2016-04-02 (×5): 10 mg via ORAL
  Filled 2016-03-31 (×7): qty 1

## 2016-03-31 MED ORDER — ONDANSETRON HCL 4 MG/2ML IJ SOLN
4.0000 mg | Freq: Four times a day (QID) | INTRAMUSCULAR | Status: DC | PRN
  Administered 2016-04-01: 4 mg via INTRAVENOUS
  Filled 2016-03-31: qty 2

## 2016-03-31 MED ORDER — SODIUM CHLORIDE 0.9% FLUSH
3.0000 mL | Freq: Two times a day (BID) | INTRAVENOUS | Status: DC
  Administered 2016-04-01: 3 mL via INTRAVENOUS

## 2016-03-31 MED ORDER — OXYCODONE-ACETAMINOPHEN 7.5-325 MG PO TABS
1.0000 | ORAL_TABLET | ORAL | Status: DC | PRN
  Administered 2016-03-31 – 2016-04-02 (×3): 1 via ORAL
  Filled 2016-03-31 (×3): qty 1

## 2016-03-31 MED ORDER — SODIUM CHLORIDE 0.9% FLUSH
3.0000 mL | Freq: Two times a day (BID) | INTRAVENOUS | Status: DC
  Administered 2016-04-01 – 2016-04-02 (×3): 3 mL via INTRAVENOUS

## 2016-03-31 MED ORDER — SODIUM CHLORIDE 0.9 % IV SOLN: 250.0000 mL | INTRAVENOUS | Status: DC | PRN

## 2016-03-31 MED ORDER — HEPARIN SODIUM (PORCINE) 5000 UNIT/ML IJ SOLN
5000.0000 [IU] | Freq: Three times a day (TID) | INTRAMUSCULAR | Status: DC
  Filled 2016-03-31: qty 1

## 2016-03-31 MED ORDER — LORAZEPAM 1 MG PO TABS: 1.0000 mg | ORAL_TABLET | Freq: Every day | ORAL | Status: DC | PRN

## 2016-03-31 MED ORDER — SERTRALINE HCL 100 MG PO TABS
100.0000 mg | ORAL_TABLET | Freq: Every day | ORAL | Status: DC
  Administered 2016-04-01 – 2016-04-02 (×2): 100 mg via ORAL
  Filled 2016-03-31 (×2): qty 1

## 2016-03-31 MED ORDER — MORPHINE SULFATE (PF) 2 MG/ML IV SOLN: 2.0000 mg | INTRAVENOUS | Status: DC | PRN

## 2016-03-31 MED ORDER — AMLODIPINE BESYLATE 5 MG PO TABS
5.0000 mg | ORAL_TABLET | Freq: Every day | ORAL | Status: DC
  Administered 2016-04-01 – 2016-04-02 (×2): 5 mg via ORAL
  Filled 2016-03-31 (×2): qty 1

## 2016-03-31 MED ORDER — HEPARIN SODIUM (PORCINE) 1000 UNIT/ML DIALYSIS
1000.0000 [IU] | INTRAMUSCULAR | Status: DC | PRN
  Filled 2016-03-31: qty 1

## 2016-03-31 MED ORDER — VANCOMYCIN HCL 500 MG IV SOLR
500.0000 mg | INTRAVENOUS | Status: DC | PRN
  Filled 2016-03-31: qty 500

## 2016-03-31 MED ORDER — DICLOFENAC SODIUM 1 % TD GEL
2.0000 g | Freq: Four times a day (QID) | TRANSDERMAL | Status: DC
  Administered 2016-04-01 – 2016-04-02 (×2): 2 g via TOPICAL
  Filled 2016-03-31: qty 100

## 2016-03-31 MED ORDER — PIPERACILLIN-TAZOBACTAM 3.375 G IVPB
3.3750 g | Freq: Once | INTRAVENOUS | Status: AC
  Administered 2016-03-31: 3.375 g via INTRAVENOUS
  Filled 2016-03-31: qty 50

## 2016-03-31 MED ORDER — SEVELAMER CARBONATE 800 MG PO TABS
1600.0000 mg | ORAL_TABLET | Freq: Three times a day (TID) | ORAL | Status: DC
  Administered 2016-04-01 – 2016-04-02 (×3): 1600 mg via ORAL
  Filled 2016-03-31: qty 3
  Filled 2016-03-31: qty 1
  Filled 2016-03-31: qty 2
  Filled 2016-03-31: qty 1

## 2016-03-31 MED ORDER — VANCOMYCIN HCL IN DEXTROSE 1-5 GM/200ML-% IV SOLN
1000.0000 mg | Freq: Once | INTRAVENOUS | Status: AC
  Administered 2016-03-31: 1000 mg via INTRAVENOUS
  Filled 2016-03-31: qty 200

## 2016-03-31 MED ORDER — ALTEPLASE 2 MG IJ SOLR
2.0000 mg | Freq: Once | INTRAMUSCULAR | Status: DC
  Filled 2016-03-31: qty 2

## 2016-03-31 MED ORDER — ACETAMINOPHEN 325 MG PO TABS
650.0000 mg | ORAL_TABLET | Freq: Four times a day (QID) | ORAL | Status: DC | PRN
  Administered 2016-04-01: 650 mg via ORAL
  Filled 2016-03-31: qty 2

## 2016-03-31 MED ORDER — HYDRALAZINE HCL 25 MG PO TABS
25.0000 mg | ORAL_TABLET | Freq: Three times a day (TID) | ORAL | Status: DC
  Administered 2016-04-01 – 2016-04-02 (×5): 25 mg via ORAL
  Filled 2016-03-31 (×5): qty 1

## 2016-03-31 MED ORDER — CARVEDILOL 12.5 MG PO TABS
12.5000 mg | ORAL_TABLET | Freq: Every day | ORAL | Status: DC
  Administered 2016-04-01 – 2016-04-02 (×2): 12.5 mg via ORAL
  Filled 2016-03-31 (×2): qty 1

## 2016-03-31 MED ORDER — SODIUM CHLORIDE 0.9% FLUSH: 3.0000 mL | INTRAVENOUS | Status: DC | PRN

## 2016-03-31 MED ORDER — PIPERACILLIN-TAZOBACTAM 3.375 G IVPB
3.3750 g | Freq: Two times a day (BID) | INTRAVENOUS | Status: DC
  Administered 2016-04-01 – 2016-04-02 (×3): 3.375 g via INTRAVENOUS
  Filled 2016-03-31 (×4): qty 50

## 2016-03-31 NOTE — ED Provider Notes (Signed)
Patient's potassium is elevated. discussed Dr. Thedore Mins who will arrange for dialysis, we'll admit to the hospital service. On reexamination patient's arm is erythematous and tender, concerning for infection given recent surgical procedure. We will give IV antibiotics, check blood cultures while we wait for Dr. Wyn Quaker to evaluate the area  Jene Every, MD 03/31/16 1750

## 2016-03-31 NOTE — Progress Notes (Signed)
STARTED HEMODIALYSIS 

## 2016-03-31 NOTE — H&P (Signed)
[facility] [place] [facility]   PATIENT NAME: [name] Small    MR#:  [id number]  DATE OF BIRTH:  1991/05/27  DATE OF ADMISSION:  03/31/2016  PRIMARY CARE PHYSICIAN: Jeanann Lewandowsky, MD   REQUESTING/REFERRING PHYSICIAN: Jene Every M.D.  CHIEF COMPLAINT:   Chief Complaint  Patient presents with  . Vascular Access Problem    HISTORY OF PRESENT ILLNESS: [name] Small  is a 25 y.o. female with a known history of End-stage renal disease who is on dialysis who had a fistula placed in her left upper extremity in 2012. Patient related recently had a procedure to the fistula recently 2 weeks ago. Who presents to the ED with his swelling of the left upper arm AV fistula. And warmth there is concern for infection. The ER physician has already spoken to Dr. dew who will come evaluate the patient. Patient also went to hemodialysis today and they had a hard time with her hemodialysis access in her groin. Her potassium is high. Dr. Cyril Loosen also spoke to Dr. Thedore Mins they will try to give her TPA and try to do hemodialysis. Patient does complain of chills she did not check her temperature.    PAST MEDICAL HISTORY:   Past Medical History  Diagnosis Date  . Hemodialysis patient (HCC)     Mon. Wed. Fri  . Hypertension   . Pulmonary emboli (HCC) 01/2012    Bilateral, moderate clot burden, areas of pulmonary infarction and central necrosis  . CHF (congestive heart failure) (HCC)   . Cardiomyopathy   . Dysrhythmia     at times per pt.  . Anemia   . H/O transfusion of packed red blood cells   . 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 Polycystic kidney disease   . Cellulitis and abscess of face 03/22/2013  . Renal insufficiency   . Chronic pain   . GERD (gastroesophageal reflux disease)   . Depression   . Hyperkalemia 09/2015  . Pelvic fracture (HCC)   .  Osteoporosis   . Sickle cell anemia (HCC)   . Narcotic abuse, continuous   . 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 Anxiety   . Sickle cell anemia with crisis [facility]) March 2017    PAST SURGICAL HISTORY: Past Surgical History  Procedure Laterality Date  . Nephrectomy Right     third kidney placed in 2008, and body rejected in 2012   . Av fistula placement    . Kidney transplant  2008    failed  . Tonsillectomy      as a child.  . Adenoidectomy    . Incision and drainage abscess Right 03/21/2013    Procedure: INCISION AND DRAINAGE RIGHT CHEEK ABSCESS REMOVAL OF FOREIGN BODY;  Surgeon: Serena Colonel, MD;  Location: [facility] OR;  Service: ENT;  Laterality: Right;  . Implantable cardioverter defibrillator implant Right 12/2014  . Peripheral vascular catheterization Left 03/09/2016    Procedure: A/V Shuntogram/Fistulagram;  Surgeon: Annice Needy, MD;  Location: [facility] INVASIVE CV LAB;  Service: Cardiovascular;  Laterality: Left;  . Insertion of dialysis catheter Right Mar 09, 2016    thigh, Dr. Wyn Quaker, [facility]  . Cardiac catheterization    . Revison of arteriovenous fistula Left 03/22/2016    Procedure: REVISON OF ARTERIOVENOUS FISTULA ( ARTEGRAFT );  Surgeon: Annice Needy, MD;  Location: [facility] ORS;  Service:  Vascular;  Laterality: Left;    SOCIAL HISTORY:  Social History  Substance Use Topics  . Smoking status: Former Smoker -- 0.00 packs/day for 1 years    Types: Cigarettes    Quit date: 12/08/2015  . Smokeless tobacco: Never Used  . Alcohol Use: No    FAMILY HISTORY:  Family History  Problem Relation Age of Onset  . Polycystic kidney disease Father   . Hypertension Father     DRUG ALLERGIES:  Allergies  Allergen Reactions  . Aspirin Other (See Comments)    Interacts with Coreg  . Tramadol Anaphylaxis  . Vicodin [Hydrocodone-Acetaminophen] Hives  . Buprenorphine Hcl Itching    Ok with oxycodone  . Iohexol Itching  . Morphine And Related  Itching    Ok with oxycodone    REVIEW OF SYSTEMS:   CONSTITUTIONAL: No fever, fatigue or weakness. Positive chills EYES: No blurred or double vision.  EARS, NOSE, AND THROAT: No tinnitus or ear pain.  RESPIRATORY: No cough, shortness of breath, wheezing or hemoptysis.  CARDIOVASCULAR: No chest pain, orthopnea, edema.  GASTROINTESTINAL: No nausea, vomiting, diarrhea or abdominal pain.  GENITOURINARY: No dysuria, hematuria.  ENDOCRINE: No polyuria, nocturia,  HEMATOLOGY: No anemia, easy bruising or bleeding SKIN: Left upper extremity warmth MUSCULOSKELETAL: No joint pain or arthritis.  Left upper extremity swelling NEUROLOGIC: No tingling, numbness, weakness.  PSYCHIATRY: No anxiety or depression.   MEDICATIONS AT HOME:  Prior to Admission medications   Medication Sig Start Date End Date Taking? Authorizing Provider  amLODipine (NORVASC) 5 MG tablet Take 1 tablet (5 mg total) by mouth daily. 03/21/16  Yes Will Jorja Loa, MD  calcium carbonate, dosed in mg elemental calcium, 1250 (500 Ca) MG/5ML Take 5 mLs (500 mg of elemental calcium total) by mouth every 6 (six) hours as needed for indigestion. 01/11/16  Yes Rhetta Mura, MD  carvedilol (COREG) 6.25 MG tablet Take 12.5 mg by mouth daily.    Yes Historical Provider, MD  cinacalcet (SENSIPAR) 30 MG tablet Take 1 tablet (30 mg total) by mouth daily with breakfast. Patient taking differently: Take 30 mg by mouth daily with supper.  03/13/16  Yes Quentin Angst, MD  diclofenac sodium (VOLTAREN) 1 % GEL Apply 2 g topically 4 (four) times daily. 02/29/16  Yes Quentin Angst, MD  hydrALAZINE (APRESOLINE) 25 MG tablet Take 1 tablet (25 mg total) by mouth 3 (three) times daily. 03/21/16  Yes Will Jorja Loa, MD  isosorbide dinitrate (ISORDIL) 10 MG tablet Take 1 tablet (10 mg total) by mouth 3 (three) times daily. 02/01/16  Yes Olugbemiga Annitta Needs, MD  LORazepam (ATIVAN) 1 MG tablet Take 1 tablet (1 mg total) by mouth daily as  needed for anxiety. 02/29/16  Yes Quentin Angst, MD  multivitamin (RENA-VIT) TABS tablet Take 1 tablet by mouth daily. 01/04/16  Yes Olugbemiga Annitta Needs, MD  oxyCODONE (ROXICODONE) 5 MG immediate release tablet Take 1 tablet (5 mg total) by mouth every 4 (four) hours as needed for severe pain. 02/29/16  Yes Quentin Angst, MD  oxyCODONE-acetaminophen (PERCOCET) 7.5-325 MG tablet Take 1 tablet by mouth every 4 (four) hours as needed for severe pain. 03/22/16  Yes Annice Needy, MD  sertraline (ZOLOFT) 100 MG tablet Take 1 tablet (100 mg total) by mouth daily. 03/13/16  Yes Quentin Angst, MD  sevelamer carbonate (RENVELA) 800 MG tablet Take 2-3 tablets (1,600-2,400 mg total) by mouth 3 (three) times daily with meals. 3 tabs with meals, 2 tabs with  snacks 01/04/16 01/03/17 Yes Olugbemiga Annitta Needs, MD      PHYSICAL EXAMINATION:   VITAL SIGNS: Blood pressure 171/117, pulse 95, temperature 98.2 F (36.8 C), temperature source Oral, resp. rate 12, height 5\' 3"  (1.6 m), weight 56 kg (123 lb 7.3 oz), SpO2 88 %.  GENERAL:  25 y.o.-year-old patient lying in the bed with no acute distress.  EYES: Pupils equal, round, reactive to light and accommodation. No scleral icterus. Extraocular muscles intact.  HEENT: Head atraumatic, normocephalic. Oropharynx and nasopharynx clear.  NECK:  Supple, no jugular venous distention. No thyroid enlargement, no tenderness.  LUNGS: Normal breath sounds bilaterally, no wheezing, rales,rhonchi or crepitation. No use of accessory muscles of respiration.  CARDIOVASCULAR: S1, S2 normal. No murmurs, rubs, or gallops.  ABDOMEN: Soft, nontender, nondistended. Bowel sounds present. No organomegaly or mass.  EXTREMITIES: Left upper ext erythema, wormth  And swelling erythema  NEUROLOGIC: Cranial nerves II through XII are intact. Muscle strength 5/5 in all extremities. Sensation intact. Gait not checked.  PSYCHIATRIC: The patient is alert and oriented x 3.  SKIN: No obvious  rash, lesion, or ulcer.   LABORATORY PANEL:   CBC  Recent Labs Lab 03/31/16 1620  WBC 5.9  HGB 9.0*  HCT 27.8*  PLT 177  MCV 91.4  MCH 29.7  MCHC 32.5  RDW 19.3*  LYMPHSABS 1.2  MONOABS 0.5  EOSABS 0.0  BASOSABS 0.1   ------------------------------------------------------------------------------------------------------------------  Chemistries   Recent Labs Lab 03/31/16 1620  NA 136  K 6.3*  CL 100*  CO2 20*  GLUCOSE 86  BUN 104*  CREATININE 12.76*  CALCIUM 9.7  AST 26  ALT 5*  ALKPHOS 344*  BILITOT 0.9   ------------------------------------------------------------------------------------------------------------------ estimated creatinine clearance is 5.6 mL/min (by C-G formula based on Cr of 12.76). ------------------------------------------------------------------------------------------------------------------ No results for input(s): TSH, T4TOTAL, T3FREE, THYROIDAB in the last 72 hours.  Invalid input(s): FREET3   Coagulation profile  Recent Labs Lab 03/31/16 1620  INR 1.35   ------------------------------------------------------------------------------------------------------------------- No results for input(s): DDIMER in the last 72 hours. -------------------------------------------------------------------------------------------------------------------  Cardiac Enzymes No results for input(s): CKMB, TROPONINI, MYOGLOBIN in the last 168 hours.  Invalid input(s): CK ------------------------------------------------------------------------------------------------------------------ Invalid input(s): POCBNP  ---------------------------------------------------------------------------------------------------------------  Urinalysis    Component Value Date/Time   COLORURINE YELLOW 01/14/2013 1639   APPEARANCEUR CLOUDY* 01/14/2013 1639   LABSPEC 1.008 01/14/2013 1639   PHURINE 8.0 01/14/2013 1639   GLUCOSEU NEGATIVE 01/14/2013 1639   HGBUR  SMALL* 01/14/2013 1639   BILIRUBINUR NEGATIVE 01/14/2013 1639   KETONESUR NEGATIVE 01/14/2013 1639   PROTEINUR 30* 01/14/2013 1639   UROBILINOGEN 0.2 01/14/2013 1639   NITRITE NEGATIVE 01/14/2013 1639   LEUKOCYTESUR MODERATE* 01/14/2013 1639     RADIOLOGY: No results found.  EKG: Orders placed or performed during the hospital encounter of 03/31/16  . ED EKG  . ED EKG  . EKG 12-Lead  . EKG 12-Lead    IMPRESSION AND PLAN: Pt is 25 y.o with ESRD with left upper fistula infection  1. Left upper extremity fistula infection- vascular surgery consult to evluate the fistula Broad spectrum iv zosyn and vancomycin  2. Hyperkalemia: urgent HD per nephrology  3. ESRD- hd as above  4. Accelerated hypertension continue coreg, hydralzine, imdure Add prn hydralazine  5. Depression- zoloft  6. Misc: heparin   All the records are reviewed and case discussed with ED provider. Management plans discussed with the patient, family and they are in agreement.  CODE STATUS: Code Status History    Date Active Date Inactive  Code Status Order ID Comments User Context   01/17/2016 10:54 PM 01/18/2016  7:52 PM Full Code [id number]  Eduard Clos, MD Inpatient   01/08/2016 11:10 PM 01/11/2016  7:44 PM Full Code [id number]  Rhetta Mura, MD Inpatient   01/02/2016  9:33 PM 01/03/2016  8:27 PM Full Code [id number]  Eduard Clos, MD Inpatient   10/27/2015 11:27 PM 10/29/2015  7:21 PM Full Code [id number]  Campbell Stall, MD Inpatient   10/11/2015  5:44 AM 10/12/2015  8:55 PM Full Code [id number]  Bobette Mo, MD ED   10/04/2015 12:44 AM 10/05/2015  7:19 PM Full Code [id number]  Ron Parker, MD Inpatient   09/21/2015 12:24 PM 09/25/2015  9:42 PM Full Code [id number]  Meredith Pel, NP Inpatient   04/02/2015  9:53 PM 04/05/2015  7:27 PM Full Code [id number]  Lorretta Harp, MD Inpatient   03/02/2015 10:04 PM 03/04/2015  7:28 PM Full Code [id number]  Rolly Salter, MD Inpatient   02/21/2015   6:29 PM 02/25/2015  8:23 PM Full Code [id number]  Maretta Bees, MD Inpatient   01/26/2015 12:01 PM 01/29/2015  6:47 PM Full Code [id number]  Clydia Llano, MD Inpatient   04/22/2013  9:57 PM 05/01/2013  3:24 PM Full Code [id number]  Eduard Clos, MD Inpatient   03/21/2013  4:15 AM 03/24/2013  7:34 PM Full Code [id number]  Hillary Bow, DO ED   02/20/2012  9:54 PM 02/27/2012  5:13 PM Full Code [id number]  Lucilla Edin, RN Inpatient   02/03/2012  3:16 AM 02/14/2012  4:44 PM Full Code [id number]  Mirian Mo, RN Inpatient       TOTAL TIME TAKING CARE OF THIS PATIENT55 minutes.    Auburn Bilberry M.D on 03/31/2016 at 6:30 PM  Between 7am to 6pm - Pager - [phone]  After 6pm go to [email or web address] - password [email or web address] [facility]  [facility] [place] [facility]  Office  [phone]  CC: Primary care physician; Jeanann Lewandowsky, MD

## 2016-03-31 NOTE — Progress Notes (Signed)
Pharmacy Antibiotic Note  [name] Small is a 25 y.o. female admitted on 03/31/2016 with wound infection.  Pharmacy has been consulted for vancomycin and Zosyn dosing. She is ESRD receiving HD.  Patient received vancomycin 1gm IV x1 and Zosyn 3.375 IV X1 in the ED.    Plan: Pharmacy will follow up on Dialysis schedule, and schedule levels and vancomycin with dialysis days. Recommend vancomycin 500mg  with each dialysis.  Will start Zosyn 3.375mg  IV EI every 12 hours.   Pharmacy will continue to monitor and adjust per consult.    Height: 5\' 3"  (160 cm) Weight: 123 lb 7.3 oz (56 kg) IBW/kg (Calculated) : 52.4  Temp (24hrs), Avg:98.2 F (36.8 C), Min:98.2 F (36.8 C), Max:98.2 F (36.8 C)   Recent Labs Lab 03/31/16 1620  WBC 5.9  CREATININE 12.76*    Estimated Creatinine Clearance: 5.6 mL/min (by C-G formula based on Cr of 12.76).    Allergies  Allergen Reactions  . Aspirin Other (See Comments)    Interacts with Coreg  . Tramadol Anaphylaxis  . Vicodin [Hydrocodone-Acetaminophen] Hives  . Buprenorphine Hcl Itching    Ok with oxycodone  . Iohexol Itching  . Morphine And Related Itching    Ok with oxycodone    Antimicrobials this admission: 03/31/16>> vancomycin  03/31/16 >> Zosyn   Dose adjustments this admission: Zosyn   Microbiology results: 03/31/16 BCx: pending  Thank you for allowing pharmacy to be a part of this patient's care.  Cher Nakai, PharmD Pharmacy Resident  03/31/2016 7:29 PM

## 2016-03-31 NOTE — ED Provider Notes (Addendum)
[facility] Emergency Department Provider Note  ____________________________________________   I have reviewed the triage vital signs and the nursing notes.   HISTORY  Chief Complaint Vascular Access Problem    HPI [name] Small is a 25 y.o. female with a complicated past medical history. Patient has history of end-stage renal disease, polycystic ovarian syndrome, sickle cell, and very, complicated past nuchal history. Had recent vascular access revision on her left upper extremity shunt, and has had swelling to that arm for the last 4 or 5 days. No fever no chills. It is not being used for dialysis. Patient has a right inguinal line which was recently placed as well. Patient states that they were using it for Monday Wednesday Friday dialysis. They did a partial dialysis on Wednesday and then today they were unable to access that groin line so they sent her to the emergency department. Patient does get chronic pain medication and is requesting Dilaudid because her arm hurts she states.     Past Medical History  Diagnosis Date  . Hemodialysis patient (HCC)     Mon. Wed. Fri  . Hypertension   . Pulmonary emboli (HCC) 01/2012    Bilateral, moderate clot burden, areas of pulmonary infarction and central necrosis  . CHF (congestive heart failure) (HCC)   . Cardiomyopathy   . Dysrhythmia     at times per pt.  . Anemia   . H/O transfusion of packed red blood cells   . 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 Polycystic kidney disease   . Cellulitis and abscess of face 03/22/2013  . Renal insufficiency   . Chronic pain   . GERD (gastroesophageal reflux disease)   . Depression   . Hyperkalemia 09/2015  . Pelvic fracture (HCC)   . Osteoporosis   . Sickle cell anemia (HCC)   . Narcotic abuse, continuous   . AICD (automatic cardioverter/defibrillator)  present 12/16/14    [employer]  . Anxiety   . Sickle cell anemia with crisis [facility]) March 2017    Patient Active Problem List   Diagnosis Date Noted  . Chronic pain syndrome 02/29/2016  . Chronic combined systolic and diastolic congestive heart failure (HCC) 01/04/2016  . AICD (automatic cardioverter/defibrillator) present 01/04/2016  . 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 Pathologic pelvic fracture 01/04/2016  . Depression 01/04/2016  . Pain in the chest   . ESRD on dialysis (HCC)   . Tachycardia   . Elevated serum hCG   . Chest pain 10/11/2015  . Fluid overload 10/10/2015  . Hypertension 10/10/2015  . Anemia of chronic renal failure, stage 5 (HCC) 10/04/2015  . Thrombocytopenia (HCC) 10/04/2015  . Chest pain of uncertain etiology 10/04/2015  . Chronically Elevated troponin 03/03/2015  . Hyperkalemia 02/21/2015  . Systolic CHF, chronic (HCC) 02/21/2012  . Pulmonary infarct (HCC) 02/07/2012  . ESRD (end stage renal disease) on dialysis (HCC) 02/03/2012  . PE (pulmonary embolism) 02/03/2012    Past Surgical History  Procedure Laterality Date  . Nephrectomy Right     third kidney placed in 2008, and body rejected in 2012   . Av fistula placement    . Kidney transplant  2008    failed  . Tonsillectomy      as a child.  . Adenoidectomy    . Incision and drainage abscess Right 03/21/2013    Procedure: INCISION AND DRAINAGE RIGHT CHEEK ABSCESS  REMOVAL OF FOREIGN BODY;  Surgeon: Serena Colonel, MD;  Location: [facility] OR;  Service: ENT;  Laterality: Right;  . Implantable cardioverter defibrillator implant Right 12/2014  . Peripheral vascular catheterization Left 03/09/2016    Procedure: A/V Shuntogram/Fistulagram;  Surgeon: Annice Needy, MD;  Location: [facility] INVASIVE CV LAB;  Service: Cardiovascular;  Laterality: Left;  . Insertion of dialysis catheter Right Mar 09, 2016    thigh, Dr. Wyn Quaker, [facility]  . Cardiac catheterization    . Revison of arteriovenous fistula Left 03/22/2016     Procedure: REVISON OF ARTERIOVENOUS FISTULA ( ARTEGRAFT );  Surgeon: Annice Needy, MD;  Location: [facility] ORS;  Service: Vascular;  Laterality: Left;    Current Outpatient Rx  Name  Route  Sig  Dispense  Refill  . amLODipine (NORVASC) 5 MG tablet   Oral   Take 1 tablet (5 mg total) by mouth daily.   30 tablet   3     Decreased dosage   . calcium carbonate, dosed in mg elemental calcium, 1250 (500 Ca) MG/5ML   Oral   Take 5 mLs (500 mg of elemental calcium total) by mouth every 6 (six) hours as needed for indigestion.   450 mL   30   . carvedilol (COREG) 12.5 MG tablet   Oral   Take 1 tablet (12.5 mg total) by mouth 2 (two) times daily.   60 tablet   3     Increased dosage   . cinacalcet (SENSIPAR) 30 MG tablet   Oral   Take 1 tablet (30 mg total) by mouth daily with breakfast. Patient taking differently: Take 30 mg by mouth daily with supper.    90 tablet   3   . diclofenac sodium (VOLTAREN) 1 % GEL   Topical   Apply 2 g topically 4 (four) times daily.   1 Tube   3   . hydrALAZINE (APRESOLINE) 25 MG tablet   Oral   Take 1 tablet (25 mg total) by mouth 3 (three) times daily.   90 tablet   3     New medication.   . isosorbide dinitrate (ISORDIL) 10 MG tablet   Oral   Take 1 tablet (10 mg total) by mouth 3 (three) times daily.   90 tablet   3   . LORazepam (ATIVAN) 1 MG tablet   Oral   Take 1 tablet (1 mg total) by mouth daily as needed for anxiety.   30 tablet   0   . multivitamin (RENA-VIT) TABS tablet   Oral   Take 1 tablet by mouth daily.   90 tablet   3   . oxyCODONE (ROXICODONE) 5 MG immediate release tablet   Oral   Take 1 tablet (5 mg total) by mouth every 4 (four) hours as needed for severe pain.   90 tablet   0   . oxyCODONE-acetaminophen (PERCOCET) 7.5-325 MG tablet   Oral   Take 1 tablet by mouth every 4 (four) hours as needed for severe pain.   30 tablet   0   . sertraline (ZOLOFT) 100 MG tablet   Oral   Take 1 tablet (100 mg  total) by mouth daily.   30 tablet   3   . sevelamer carbonate (RENVELA) 800 MG tablet   Oral   Take 2-3 tablets (1,600-2,400 mg total) by mouth 3 (three) times daily with meals. 3 tabs with meals, 2 tabs with snacks   120 tablet   3  Allergies Aspirin; Tramadol; Vicodin; Buprenorphine hcl; Iohexol; and Morphine and related  Family History  Problem Relation Age of Onset  . Polycystic kidney disease Father   . Hypertension Father     Social History Social History  Substance Use Topics  . Smoking status: Former Smoker -- 0.00 packs/day for 1 years    Types: Cigarettes    Quit date: 12/08/2015  . Smokeless tobacco: Never Used  . Alcohol Use: No    Review of Systems Constitutional: No fever/chills Eyes: No visual changes. ENT: No sore throat. No stiff neck no neck pain Cardiovascular: Denies chest pain. Respiratory: Denies shortness of breath. Gastrointestinal:   no vomiting.  No diarrhea.  No constipation. Genitourinary: Negative for dysuria. Musculoskeletal: Negative lower extremity swelling Skin: Negative for rash. Neurological: Negative for headaches, focal weakness or numbness. 10-point ROS otherwise negative.  ____________________________________________   PHYSICAL EXAM:  VITAL SIGNS: ED Triage Vitals  Enc Vitals Group     BP 03/31/16 1335 154/112 mmHg     Pulse Rate 03/31/16 1335 101     Resp 03/31/16 1335 20     Temp 03/31/16 1335 98.2 F (36.8 C)     Temp Source 03/31/16 1335 Oral     SpO2 03/31/16 1335 100 %     Weight 03/31/16 1335 123 lb 7.3 oz (56 kg)     Height 03/31/16 1335 5\' 3"  (1.6 m)     Head Cir --      Peak Flow --      Pain Score 03/31/16 1338 8     Pain Loc --      Pain Edu? --      Excl. in GC? --     Constitutional: Alert and oriented. Well appearing and in no acute distress. Eyes: Conjunctivae are normal. PERRL. EOMI. Head: Atraumatic. Nose: No congestion/rhinnorhea. Mouth/Throat: Mucous membranes are moist.  Oropharynx  non-erythematous. Neck: No stridor.   Nontender with no meningismus Cardiovascular: Normal rate, regular rhythm. Grossly normal heart sounds.  Good peripheral circulation. Respiratory: Normal respiratory effort.  No retractions. Lungs CTAB. Abdominal: Soft and nontender. No distention. No guarding no rebound Back:  There is no focal tenderness or step off there is no midline tenderness there are no lesions noted. there is no CVA tenderness Right inguinal leg Port-A-Cath is in the right position with no evidence of swelling or infection Musculoskeletal: There is a good deal swelling to the left upper extremity but she has good pulses, fistula is slightly warm to touch. No lower extremity tenderness. No joint effusions, no DVT signs strong distal pulses no edema Neurologic:  Normal speech and language. No gross focal neurologic deficits are appreciated.  Skin:  Skin is warm, dry and intact. No rash noted. Psychiatric: Mood and affect are normal. Speech and behavior are normal.  ____________________________________________   LABS (all labs ordered are listed, but only abnormal results are displayed)  Labs Reviewed - No data to display ____________________________________________  EKG  I personally interpreted any EKGs ordered by me or triage 2 different EKGs were obtained on this patient, the first shows sinus rhythm with a first-degree PR block. No acute ischemic changes, flipped T waves noted in the 6 and V3, no acute ST elevation, normal axis Second shows rate of 101, similar flipped T waves laterally possibly consistent with strain pattern or acute ST elevation or depression, flipped T waves in V3 and V6, first degree AV block ____________________________________________  RADIOLOGY  I reviewed any imaging ordered by me or triage  that were performed during my shift and, if possible, patient and/or family made aware of any abnormal  findings. ____________________________________________   PROCEDURES  Procedure(s) performed: None  Critical Care performed: None  ____________________________________________   INITIAL IMPRESSION / ASSESSMENT AND PLAN / ED COURSE  Pertinent labs & imaging results that were available during my care of the patient were reviewed by me and considered in my medical decision making (see chart for details).  Patient here because of swelling in her left upper extremity as well as inability to use her left dialysis catheter. She had dialysis on Wednesday. Has had no other systemic illness such as fever or chills. Discussed with Dr. Wyn Quaker, who will come evaluate the patient's fistula but he does expected to be swollen. We will after discussion with him and send the patient to see if TPA can reverse the clotting in her port which does not appear to be otherwise compromised at this time. We will check electrolytes as a precaution, and we will further to vascular surgery management of the swollen arm.  ----------------------------------------- 3:54 PM on 03/31/2016 -----------------------------------------  Vascular lab states the patient does not have a clot in her catheter. They are able to flush and however there is some delayed pullback and it appears to be positional. Dr. Cyril Loosen will take over this patient and discuss with Dr. dew ____________________________________________   FINAL CLINICAL IMPRESSION(S) / ED DIAGNOSES  Final diagnoses:  None      This chart was dictated using voice recognition software.  Despite best efforts to proofread,  errors can occur which can change meaning.     Jeanmarie Plant, MD 03/31/16 1448  Jeanmarie Plant, MD 03/31/16 [phone]

## 2016-03-31 NOTE — Progress Notes (Signed)
PRE DIALYSIS ASSESSMENT 

## 2016-03-31 NOTE — ED Notes (Signed)
Pt arrived via ems from dialysis due to inability to access port. Visible swelling noted to right arm, pt states the swelling began Sunday. Pt states being nauseated since Wednesday.

## 2016-04-01 ENCOUNTER — Inpatient Hospital Stay: Payer: Medicare Other

## 2016-04-01 LAB — BASIC METABOLIC PANEL
Anion gap: 15 (ref 5–15)
BUN: 76 mg/dL — ABNORMAL HIGH (ref 6–20)
CHLORIDE: 99 mmol/L — AB (ref 101–111)
CO2: 24 mmol/L (ref 22–32)
CREATININE: 9.88 mg/dL — AB (ref 0.44–1.00)
Calcium: 10.1 mg/dL (ref 8.9–10.3)
GFR calc non Af Amer: 5 mL/min — ABNORMAL LOW (ref >60)
GFR, EST AFRICAN AMERICAN: 6 mL/min — AB (ref >60)
Glucose, Bld: 97 mg/dL (ref 65–99)
Potassium: 4.4 mmol/L (ref 3.5–5.1)
Sodium: 138 mmol/L (ref 135–145)

## 2016-04-01 LAB — CBC
HEMATOCRIT: 27.4 % — AB (ref 35.0–47.0)
HEMOGLOBIN: 9 g/dL — AB (ref 12.0–16.0)
MCH: 30 pg (ref 26.0–34.0)
MCHC: 32.7 g/dL (ref 32.0–36.0)
MCV: 91.6 fL (ref 80.0–100.0)
Platelets: 159 10*3/uL (ref 150–440)
RBC: 2.99 MIL/uL — ABNORMAL LOW (ref 3.80–5.20)
RDW: 19 % — AB (ref 11.5–14.5)
WBC: 4.6 10*3/uL (ref 3.6–11.0)

## 2016-04-01 MED ORDER — VANCOMYCIN HCL 500 MG IV SOLR
500.0000 mg | Freq: Once | INTRAVENOUS | Status: AC
  Administered 2016-04-01: 500 mg via INTRAVENOUS
  Filled 2016-04-01: qty 500

## 2016-04-01 MED ORDER — HYDRALAZINE HCL 20 MG/ML IJ SOLN: 10.0000 mg | INTRAMUSCULAR | Status: DC | PRN

## 2016-04-01 MED ORDER — VANCOMYCIN HCL 500 MG IV SOLR: 500.0000 mg | INTRAVENOUS | Status: DC

## 2016-04-01 MED ORDER — HYDROMORPHONE HCL 1 MG/ML IJ SOLN
1.0000 mg | INTRAMUSCULAR | Status: DC | PRN
  Administered 2016-04-01 – 2016-04-02 (×9): 1 mg via INTRAVENOUS
  Filled 2016-04-01 (×9): qty 1

## 2016-04-01 MED ORDER — ENOXAPARIN SODIUM 80 MG/0.8ML ~~LOC~~ SOLN
65.0000 mg | SUBCUTANEOUS | Status: DC
  Administered 2016-04-01: 65 mg via SUBCUTANEOUS
  Filled 2016-04-01: qty 0.8

## 2016-04-01 MED ORDER — HYDRALAZINE HCL 20 MG/ML IJ SOLN
10.0000 mg | INTRAMUSCULAR | Status: DC | PRN
  Administered 2016-04-01 – 2016-04-02 (×2): 10 mg via INTRAVENOUS
  Filled 2016-04-01 (×2): qty 1

## 2016-04-01 NOTE — Progress Notes (Signed)
ANTICOAGULATION CONSULT NOTE - Initial Consult  Pharmacy Consult for Lovenox  Indication: DVT  Allergies  Allergen Reactions  . Aspirin Other (See Comments)    Interacts with Coreg  . Tramadol Anaphylaxis  . Vicodin [Hydrocodone-Acetaminophen] Hives  . Buprenorphine Hcl Itching    Ok with oxycodone  . Iohexol Itching  . Morphine And Related Itching    Ok with oxycodone    Patient Measurements: Height:  (160 cm) Weight: 141 lb 11.2 oz (64.275 kg) IBW/kg (Calculated) : 52.4 Heparin Dosing Weight:   Vital Signs: Temp: 98.4 F (36.9 C) (06/10 1255) Temp Source: Oral (06/10 1255) BP: 154/103 mmHg (06/10 1715) Pulse Rate: 101 (06/10 1715)  Labs:  Recent Labs  03/31/16 1620 04/01/16 0443  HGB 9.0* 9.0*  HCT 27.8* 27.4*  PLT 177 159  LABPROT 16.8*  --   INR 1.35  --   CREATININE 12.76* 9.88*    Estimated Creatinine Clearance: 7.9 mL/min (by C-G formula based on Cr of 9.88).   Medical History: Past Medical History  Diagnosis Date  . Hemodialysis patient (HCC)     Mon. Wed. Fri  . Hypertension   . Pulmonary emboli (HCC) 01/2012    Bilateral, moderate clot burden, areas of pulmonary infarction and central necrosis  . CHF (congestive heart failure) (HCC)   . Cardiomyopathy   . Dysrhythmia     at times per pt.  . Anemia   . H/O transfusion of packed red blood cells   . 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 Polycystic kidney disease   . Cellulitis and abscess of face 03/22/2013  . Renal insufficiency   . Chronic pain   . GERD (gastroesophageal reflux disease)   . Depression   . Hyperkalemia 09/2015  . Pelvic fracture (HCC)   . Osteoporosis   . Sickle cell anemia (HCC)   . Narcotic abuse, continuous   . 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 Anxiety   . Sickle cell anemia with crisis [facility]) March 2017     Medications:  Prescriptions prior to admission  Medication Sig Dispense Refill Last Dose  . amLODipine (NORVASC) 5 MG tablet Take 1 tablet (5 mg total) by mouth daily. 30 tablet 3 03/31/2016 at 0800  . calcium carbonate, dosed in mg elemental calcium, 1250 (500 Ca) MG/5ML Take 5 mLs (500 mg of elemental calcium total) by mouth every 6 (six) hours as needed for indigestion. 450 mL 30 03/31/2016 at 0800  . carvedilol (COREG) 6.25 MG tablet Take 12.5 mg by mouth daily.   3 03/31/2016 at 0800  . cinacalcet (SENSIPAR) 30 MG tablet Take 1 tablet (30 mg total) by mouth daily with breakfast. (Patient taking differently: Take 30 mg by mouth daily with supper. ) 90 tablet 3 03/31/2016 at 0800  . diclofenac sodium (VOLTAREN) 1 % GEL Apply 2 g topically 4 (four) times daily. 1 Tube 3 03/31/2016 at Unknown time  . hydrALAZINE (APRESOLINE) 25 MG tablet Take 1 tablet (25 mg total) by mouth 3 (three) times daily. 90 tablet 3 03/31/2016 at 0800  . isosorbide dinitrate (ISORDIL) 10 MG tablet Take 1 tablet (10 mg total) by mouth 3 (three) times daily. 90 tablet 3 03/31/2016 at 0800  . LORazepam (ATIVAN) 1 MG tablet Take 1 tablet (1 mg total) by mouth daily as needed for anxiety. 30 tablet 0 03/30/2016 at 0800  .  multivitamin (RENA-VIT) TABS tablet Take 1 tablet by mouth daily. 90 tablet 3 03/31/2016 at 0800  . oxyCODONE (ROXICODONE) 5 MG immediate release tablet Take 1 tablet (5 mg total) by mouth every 4 (four) hours as needed for severe pain. 90 tablet 0 prn  . oxyCODONE-acetaminophen (PERCOCET) 7.5-325 MG tablet Take 1 tablet by mouth every 4 (four) hours as needed for severe pain. 30 tablet 0 prn  . sertraline (ZOLOFT) 100 MG tablet Take 1 tablet (100 mg total) by mouth daily. 30 tablet 3 03/31/2016 at 0800  . sevelamer carbonate (RENVELA) 800 MG tablet Take 2-3 tablets (1,600-2,400 mg total) by mouth 3 (three) times daily with meals. 3 tabs with meals, 2 tabs with snacks 120 tablet 3 03/31/2016 at 0800    Assessment: Pharmacy  consulted to dose lovenox for VTE in this 25 year old female .  CrCl = 7.9 ml/min TBW = 64.3 ml/min  Goal of Therapy:  resolution of VTE Monitor platelets by anticoagulation protocol: Yes   Plan:  Lovenox 65 mg SQ Q24H ordered to start 6/10 @ 19:00.  Will check CBC daily.   Koby Hartfield D 04/01/2016,6:51 PM

## 2016-04-01 NOTE — Progress Notes (Signed)
[facility]  Daily Progress Note   Subjective  - * No surgery found *  Arm swollen and painful No fever Permcath in groin is positional but worked for dialysis yesterday  Objective Filed Vitals:   04/01/16 0546 04/01/16 1003 04/01/16 1255 04/01/16 1323  BP: 139/89 146/104 157/112 137/94  Pulse: 104 55 110 111  Temp:  98.1 F (36.7 C) 98.4 F (36.9 C)   TempSrc:  Oral Oral   Resp:   18   Height:      Weight:      SpO2: 100% 100% 97%     Intake/Output Summary (Last 24 hours) at 04/01/16 1656 Last data filed at 04/01/16 0900  Gross per 24 hour  Intake    540 ml  Output   2500 ml  Net  -1960 ml    PULM  CTAB CV  RRR VASC  No erythema, no warmth, no purulent drainage.  Incisions C/D/I. Good thrill in AVF/graft revision. Left arm swelling 3+  Laboratory CBC    Component Value Date/Time   WBC 4.6 04/01/2016 0443   HGB 9.0* 04/01/2016 0443   HCT 27.4* 04/01/2016 0443   PLT 159 04/01/2016 0443    BMET    Component Value Date/Time   NA 138 04/01/2016 0443   K 4.4 04/01/2016 0443   CL 99* 04/01/2016 0443   CO2 24 04/01/2016 0443   GLUCOSE 97 04/01/2016 0443   BUN 76* 04/01/2016 0443   CREATININE 9.88* 04/01/2016 0443   CALCIUM 10.1 04/01/2016 0443   GFRNONAA 5* 04/01/2016 0443   GFRAA 6* 04/01/2016 0443    Assessment/Planning: Patient about 2 weeks s/p graft revision of left arm AVF   Left arm is quite swollen.  There is no erythema, no warmth, no fever, and WBC is normal  I do not think this is cellulitis  I have ordered a duplex for further evaluation, and attention to central veins would be helpful as a central venous issue could explain her swelling.  Suspect this is worse than normal post op swelling in a long term dialysis patient who had marked aneurysm to start with, and have recommended elevation as much as possible to help with this.    If things do not improve over next week or so and duplex is negative for DVT, a  fistulagram can be considered.      Quantisha Marsicano  04/01/2016, 4:56 PM

## 2016-04-01 NOTE — Progress Notes (Signed)
Subjective:   Patient presented yesterday by EMS from the dialysis center due to inability to run dialysis via femoral PermCath. It was accessed in the emergency room and was able to be flushed when patient was supine. Labs in the ER showed potassium of 6.3 therefore she was admitted for urgent dialysis She was urgently dialyzed last night which she tolerated well.   HEMODIALYSIS FLOWSHEET:  Blood Flow Rate (mL/min): 300 mL/min Arterial Pressure (mmHg): -220 mmHg Venous Pressure (mmHg): 140 mmHg Transmembrane Pressure (mmHg): 90 mmHg Ultrafiltration Rate (mL/min): 1000 mL/min Dialysate Flow Rate (mL/min): 800 ml/min Conductivity: Machine : 14 Conductivity: Machine : 14 Dialysis Fluid Bolus: Normal Saline Bolus Amount (mL): 250 mL Intra-Hemodialysis Comments: hd completed (total uf )  Blood flow rate of 300 cc/m was able to be achieved from the dialysis catheter A total of 2500 cc of fluid was removed Patient today, feels well Reports pain in her left arm over the AV fistula.. It is currently being treated with IV antibiotics for possible cellulitis Vascular surgery evaluation is pending  Objective:  Vital signs in last 24 hours:  Temp:  [97.6 F (36.4 C)-98.7 F (37.1 C)] 98.1 F (36.7 C) (06/10 1003) Pulse Rate:  [54-104] 55 (06/10 1003) Resp:  [12-23] 16 (06/10 0521) BP: (132-171)/(89-121) 146/104 mmHg (06/10 1003) SpO2:  [98 %-100 %] 100 % (06/10 1003) Weight:  [56 kg (123 lb 7.3 oz)-66 kg (145 lb 8.1 oz)] 64.275 kg (141 lb 11.2 oz) (06/10 0032)  Weight change:  Filed Weights   03/31/16 2145 03/31/16 2350 04/01/16 0032  Weight: 63.231 kg (139 lb 6.4 oz) 65.7 kg (144 lb 13.5 oz) 64.275 kg (141 lb 11.2 oz)    Intake/Output:    Intake/Output Summary (Last 24 hours) at 04/01/16 1052 Last data filed at 04/01/16 0900  Gross per 24 hour  Intake    540 ml  Output   2500 ml  Net  -1960 ml     Physical Exam: General: No acute distress, sitting up in bed   HEENT anicteric  Neck supple  Pulm/lungs Normal effort, clear to auscultation bilaterally  CVS/Heart Regular rhythm, no rub or gallop  Abdomen:  Soft, nontender  Extremities: No peripheral edema, left arm significant edema  Neurologic: Alert, oriented  Skin: no acute rashes  Access: Right femoral PermCath, left upper arm aneurysmal AV fistula with tenderness       Basic Metabolic Panel:   Recent Labs Lab 03/31/16 1620 04/01/16 0443  NA 136 138  K 6.3* 4.4  CL 100* 99*  CO2 20* 24  GLUCOSE 86 97  BUN 104* 76*  CREATININE 12.76* 9.88*  CALCIUM 9.7 10.1     CBC:  Recent Labs Lab 03/31/16 1620 04/01/16 0443  WBC 5.9 4.6  NEUTROABS 4.1  --   HGB 9.0* 9.0*  HCT 27.8* 27.4*  MCV 91.4 91.6  PLT 177 159      Microbiology:  Recent Results (from the past 720 hour(s))  Surgical pcr screen     Status: Abnormal   Collection Time: 03/16/16  2:22 PM  Result Value Ref Range Status   MRSA, PCR NEGATIVE NEGATIVE Final   Staphylococcus aureus POSITIVE (A) NEGATIVE Final    Coagulation Studies:  Recent Labs  03/31/16 1620  LABPROT 16.8*  INR 1.35    Urinalysis: No results for input(s): COLORURINE, LABSPEC, PHURINE, GLUCOSEU, HGBUR, BILIRUBINUR, KETONESUR, PROTEINUR, UROBILINOGEN, NITRITE, LEUKOCYTESUR in the last 72 hours.  Invalid input(s): APPERANCEUR    Imaging: No results found.  Medications:     . alteplase  2 mg Intracatheter Once  . amLODipine  5 mg Oral Daily  . carvedilol  12.5 mg Oral Daily  . cinacalcet  30 mg Oral Q breakfast  . diclofenac sodium  2 g Topical QID  . heparin  5,000 Units Subcutaneous Q8H  . hydrALAZINE  25 mg Oral TID  . isosorbide dinitrate  10 mg Oral TID  . multivitamin  1 tablet Oral Daily  . piperacillin-tazobactam (ZOSYN)  IV  3.375 g Intravenous Q12H  . sertraline  100 mg Oral Daily  . sevelamer carbonate  1,600-2,400 mg Oral TID WC  . sodium chloride flush  3 mL Intravenous Q12H  . sodium chloride flush  3 mL  Intravenous Q12H   sodium chloride, acetaminophen **OR** acetaminophen, calcium carbonate (dosed in mg elemental calcium), hydrALAZINE, HYDROmorphone (DILAUDID) injection, LORazepam, morphine injection, ondansetron **OR** ondansetron (ZOFRAN) IV, oxyCODONE-acetaminophen, sodium chloride flush, vancomycin  Assessment/ Plan:  25 y.o. female with end-stage renal disease since age 31 from polycystic kidney disease.sickle cell disease, history of peritoneal dialysis, Patient had a kidney transplant from age [age], which subsequently failed. She was then placed back on hemodialysis. Patient has a ICD for CHF  1. End-stage renal disease. [facility]. Monday/Wednesday/Friday. [facility] - Severe hyperkalemia Patient was dialyzed emergently last night for severe hyperkalemia Potassium level is corrected today Next dialysis is anticipated on Monday  2. Complication of dialysis access Her femoral PermCath appears to be positional. Blood flow 300 was able to be achieved in the supine position - We will have to try her dialysis seated on Monday morning to see how well the catheter runs - Her left arm AV fistula was recently revised surgically on May 31 by Dr. Wyn Quaker. Significant pain and edema noted in left arm. Vascular surgery evaluation is pending. - she is getting empiric antibiotics for possible cellulitis  3. Anemia of chronic kidney disease and sickle cell - current hemoglobin 9.0 - We will institute low-dose Procrit with dialysis  4. Secondary hyperparathyroidism We will monitor her phosphorus during this hospital stay Resume cinacalcet and Renvela    LOS: 1 [name] Small 6/10/201710:52 AM

## 2016-04-01 NOTE — Progress Notes (Signed)
Post dialysis assessment 

## 2016-04-01 NOTE — Progress Notes (Signed)
Pharmacy Antibiotic Note  [name] Small is a 25 y.o. female admitted on 03/31/2016 with wound infection.  Pharmacy has been consulted for vancomycin and Zosyn dosing. She is ESRD receiving HD.  Patient received vancomycin 1gm IV x1 and Zosyn 3.375 IV X1 in the ED.   Patient received emergent dialysis on 6/9.  Did not receive supplemental Vancomycin dose after this dialysis session.    Plan: Ordered Vancomycin 500mg  IV once for today, followed by Vancomycin 500mg  IV with each dialysis session on MWF.    Continue Zosyn 3.375mg  IV EI every 12 hours.   Pharmacy will continue to monitor and adjust per consult.    Height: 5\' 3"  (160 cm) Weight: 141 lb 11.2 oz (64.275 kg) IBW/kg (Calculated) : 52.4  Temp (24hrs), Avg:98.1 F (36.7 C), Min:97.6 F (36.4 C), Max:98.7 F (37.1 C)   Recent Labs Lab 03/31/16 1620 04/01/16 0443  WBC 5.9 4.6  CREATININE 12.76* 9.88*    Estimated Creatinine Clearance: 7.9 mL/min (by C-G formula based on Cr of 9.88).    Allergies  Allergen Reactions  . Aspirin Other (See Comments)    Interacts with Coreg  . Tramadol Anaphylaxis  . Vicodin [Hydrocodone-Acetaminophen] Hives  . Buprenorphine Hcl Itching    Ok with oxycodone  . Iohexol Itching  . Morphine And Related Itching    Ok with oxycodone    Antimicrobials this admission: 03/31/16>> vancomycin  03/31/16 >> Zosyn   Dose adjustments this admission: Zosyn   Microbiology results: 03/31/16 BCx: pending  Thank you for allowing pharmacy to be a part of this patient's care.  Stormy Card [facility] Clinical Pharmacist  04/01/2016 11:46 AM

## 2016-04-01 NOTE — Progress Notes (Signed)
Patient ID: [name] Small, female   DOB: Dec 25, 1990, 25 y.o.   MRN: [id number] [facility] [place] [facility]   PATIENT NAME: [name] Small    MR#:  [id number]  DATE OF BIRTH:  13-Nov-1990  SUBJECTIVE:   Complains of pain or the left arm. No fever. No discharge. Reports patient having swelling of the left upper extremity last 3-4days. REVIEW OF SYSTEMS:   Review of Systems  Constitutional: Negative for fever, chills and weight loss.  HENT: Negative for ear discharge, ear pain and nosebleeds.   Eyes: Negative for blurred vision, pain and discharge.  Respiratory: Negative for sputum production, shortness of breath, wheezing and stridor.   Cardiovascular: Negative for chest pain, palpitations, orthopnea and PND.  Gastrointestinal: Negative for nausea, vomiting, abdominal pain and diarrhea.  Genitourinary: Negative for urgency and frequency.  Musculoskeletal: Positive for joint pain. Negative for back pain.  Neurological: Negative for sensory change, speech change, focal weakness and weakness.  Psychiatric/Behavioral: Negative for depression and hallucinations. The patient is not nervous/anxious.   All other systems reviewed and are negative.  Tolerating Diet: Yes Tolerating PT: Not needed  DRUG ALLERGIES:   Allergies  Allergen Reactions  . Aspirin Other (See Comments)    Interacts with Coreg  . Tramadol Anaphylaxis  . Vicodin [Hydrocodone-Acetaminophen] Hives  . Buprenorphine Hcl Itching    Ok with oxycodone  . Iohexol Itching  . Morphine And Related Itching    Ok with oxycodone    VITALS:  Blood pressure 137/94, pulse 111, temperature 98.4 F (36.9 C), temperature source Oral, resp. rate 18, height  (1.6 m), weight 64.275 kg (141 lb 11.2 oz), SpO2 97 %.  PHYSICAL EXAMINATION:   Physical Exam  GENERAL:  25 y.o.-year-old patient lying in the bed with no acute distress.  EYES: Pupils equal, round, reactive to light and accommodation.  No scleral icterus. Extraocular muscles intact.  HEENT: Head atraumatic, normocephalic. Oropharynx and nasopharynx clear.  NECK:  Supple, no jugular venous distention. No thyroid enlargement, no tenderness.  LUNGS: Normal breath sounds bilaterally, no wheezing, rales, rhonchi. No use of accessory muscles of respiration.  CARDIOVASCULAR: S1, S2 normal. No murmurs, rubs, or gallops.  ABDOMEN: Soft, nontender, nondistended. Bowel sounds present. No organomegaly or mass.  EXTREMITIES: No cyanosis, clubbing or edema b/l.   Left upper extremity swollen. No cellulitis noted on the left upper extremity AV fistula site. No pus discharge. NEUROLOGIC: Cranial nerves II through XII are intact. No focal Motor or sensory deficits b/l.   PSYCHIATRIC:  patient is alert and oriented x 3.  SKIN: No obvious rash, lesion, or ulcer.   LABORATORY PANEL:  CBC  Recent Labs Lab 04/01/16 0443  WBC 4.6  HGB 9.0*  HCT 27.4*  PLT 159    Chemistries   Recent Labs Lab 03/31/16 1620 04/01/16 0443  NA 136 138  K 6.3* 4.4  CL 100* 99*  CO2 20* 24  GLUCOSE 86 97  BUN 104* 76*  CREATININE 12.76* 9.88*  CALCIUM 9.7 10.1  AST 26  --   ALT 5*  --   ALKPHOS 344*  --   BILITOT 0.9  --    ASSESSMENT AND PLAN:  *25 y.o with ESRD with left upper fistula infection  1. Left upper extremity fistula infection- vascular surgery consult to evluate the fistula Broad spectrum iv zosyn and vancomycin White count normal. Patient does not have fever. Doubt there is infection. Patient to be seen by Dr. Wyn Quaker ? Ultrasound upper  extremity  2. Hyperkalemia: urgent HD per nephrology Resolved with hemodialysis 3. ESRD- hd as above  4. Accelerated hypertension continue coreg, hydralzine, imdure Add prn hydralazine  5. Depression- zoloft  6. Misc: heparin Case discussed with Care Management/Social Worker. Management plans discussed with the patient, family and they are in agreement.  CODE STATUS: Full  DVT  Prophylaxis: Heparin  TOTAL TIME TAKING CARE OF THIS PATIENT: 25 minutes.  >50% time spent on counselling and coordination of care  POSSIBLE D/C IN one to 2 DAYS, DEPENDING ON CLINICAL CONDITION.  Note: This dictation was prepared with Dragon dictation along with smaller phrase technology. Any transcriptional errors that result from this process are unintentional.  Emmanuella Mirante M.D on 04/01/2016 at 1:38 PM  Between 7am to 6pm - Pager - [phone]  After 6pm go to [email or web address] - password [email or web address] [facility]  [facility]  Office  [phone]  CC: Primary care physician; Jeanann Lewandowsky, MD

## 2016-04-01 NOTE — Progress Notes (Signed)
DVT study was ordered by Vascular.  Nurse reported- Radiologist called , it is positive.  Plan:  DVT in Upper extrimity    Lovenox therapeutic dose for now,and let vascular make further decision about that tomorrow.

## 2016-04-02 LAB — CBC
HEMATOCRIT: 26.4 % — AB (ref 35.0–47.0)
Hemoglobin: 8.9 g/dL — ABNORMAL LOW (ref 12.0–16.0)
MCH: 30.7 pg (ref 26.0–34.0)
MCHC: 33.5 g/dL (ref 32.0–36.0)
MCV: 91.6 fL (ref 80.0–100.0)
Platelets: 189 10*3/uL (ref 150–440)
RBC: 2.89 MIL/uL — ABNORMAL LOW (ref 3.80–5.20)
RDW: 19.2 % — AB (ref 11.5–14.5)
WBC: 5.2 10*3/uL (ref 3.6–11.0)

## 2016-04-02 MED ORDER — OXYCODONE-ACETAMINOPHEN 7.5-325 MG PO TABS: 1.0000 | ORAL_TABLET | Freq: Four times a day (QID) | ORAL | Status: DC | PRN

## 2016-04-02 MED ORDER — APIXABAN 5 MG PO TABS: 5.0000 mg | ORAL_TABLET | Freq: Two times a day (BID) | ORAL | Status: DC

## 2016-04-02 MED ORDER — APIXABAN 5 MG PO TABS: 10.0000 mg | ORAL_TABLET | Freq: Two times a day (BID) | ORAL | Status: DC

## 2016-04-02 NOTE — Progress Notes (Signed)
Subjective:   Patient presented by EMS from the dialysis center due to inability to run dialysis via femoral PermCath. It was accessed in the emergency room and was able to be flushed when patient was supine. Labs in the ER showed potassium of 6.3 therefore she was admitted for urgent dialysis She was urgently dialyzed   Blood flow rate of 300 cc/m was able to be achieved from the dialysis catheter A total of 2500 cc of fluid was removed  Today, patient feels well Dx with Jugular vein DVT Started on anticoagulation    Objective:  Vital signs in last 24 hours:  Temp:  [98.1 F (36.7 C)-98.5 F (36.9 C)] 98.5 F (36.9 C) (06/11 0502) Pulse Rate:  [96-111] 96 (06/11 0914) Resp:  [16-18] 16 (06/11 0502) BP: (137-157)/(94-112) 143/112 mmHg (06/11 0914) SpO2:  [95 %-100 %] 95 % (06/11 0914)  Weight change:  Filed Weights   03/31/16 2145 03/31/16 2350 04/01/16 0032  Weight: 63.231 kg (139 lb 6.4 oz) 65.7 kg (144 lb 13.5 oz) 64.275 kg (141 lb 11.2 oz)    Intake/Output:    Intake/Output Summary (Last 24 hours) at 04/02/16 1012 Last data filed at 04/02/16 0502  Gross per 24 hour  Intake    410 ml  Output      0 ml  Net    410 ml     Physical Exam: General: No acute distress, sitting up in bed  HEENT anicteric  Neck supple  Pulm/lungs Normal effort, clear to auscultation bilaterally  CVS/Heart Regular rhythm, no rub or gallop  Abdomen:  Soft, nontender  Extremities: No peripheral edema, left arm significant edema  Neurologic: Alert, oriented  Skin: no acute rashes  Access: Right femoral PermCath, left upper arm aneurysmal AV fistula with tenderness       Basic Metabolic Panel:   Recent Labs Lab 03/31/16 1620 04/01/16 0443  NA 136 138  K 6.3* 4.4  CL 100* 99*  CO2 20* 24  GLUCOSE 86 97  BUN 104* 76*  CREATININE 12.76* 9.88*  CALCIUM 9.7 10.1     CBC:  Recent Labs Lab 03/31/16 1620 04/01/16 0443 04/02/16 0618  WBC 5.9 4.6 5.2  NEUTROABS 4.1  --    --   HGB 9.0* 9.0* 8.9*  HCT 27.8* 27.4* 26.4*  MCV 91.4 91.6 91.6  PLT 177 159 189      Microbiology:  Recent Results (from the past 720 hour(s))  Surgical pcr screen     Status: Abnormal   Collection Time: 03/16/16  2:22 PM  Result Value Ref Range Status   MRSA, PCR NEGATIVE NEGATIVE Final   Staphylococcus aureus POSITIVE (A) NEGATIVE Final  Blood culture (routine x 2)     Status: None (Preliminary result)   Collection Time: 03/31/16  6:15 PM  Result Value Ref Range Status   Specimen Description BLOOD RIGHT EJ  Final   Special Requests BOTTLES DRAWN AEROBIC AND ANAEROBIC  4CC  Final   Culture NO GROWTH < 24 HOURS  Final   Report Status PENDING  Incomplete  Blood culture (routine x 2)     Status: None (Preliminary result)   Collection Time: 03/31/16  6:30 PM  Result Value Ref Range Status   Specimen Description BLOOD RIGHT EJ  Final   Special Requests BOTTLES DRAWN AEROBIC AND ANAEROBIC  3CC  Final   Culture NO GROWTH < 24 HOURS  Final   Report Status PENDING  Incomplete    Coagulation Studies:  Recent Labs  03/31/16 1620  LABPROT 16.8*  INR 1.35    Urinalysis: No results for input(s): COLORURINE, LABSPEC, PHURINE, GLUCOSEU, HGBUR, BILIRUBINUR, KETONESUR, PROTEINUR, UROBILINOGEN, NITRITE, LEUKOCYTESUR in the last 72 hours.  Invalid input(s): APPERANCEUR    Imaging: US Venous Img Upper Uni Left  04/01/2016  ADDENDUM REPORT: 04/01/2016 19:38 ADDENDUM: Dr. Delton Coombes confirmed this patient is on her service at 7:37pm. At 7:38, I also gave these results to Dr. Wyn Quaker. Electronically Signed   By: Esperanza Heir M.D.   On: 04/01/2016 19:38  04/01/2016  ADDENDUM REPORT: 04/01/2016 19:33 ADDENDUM: Critical Value/emergent results were called by telephone at the time of interpretation on 04/01/2016 at 7:32 pm to the on call hospitalist, who verbally acknowledged these results. However, we are also attempting to contact the ordering physician. Electronically Signed   By: Esperanza Heir M.D.   On: 04/01/2016 19:33  04/01/2016  CLINICAL DATA:  Left arm swelling for six days EXAM: LEFT UPPER EXTREMITY VENOUS DOPPLER ULTRASOUND TECHNIQUE: Gray-scale sonography with graded compression, as well as color Doppler and duplex ultrasound were performed to evaluate the upper extremity deep venous system from the level of the subclavian vein and including the jugular, axillary, basilic, radial, ulnar and upper cephalic vein. Spectral Doppler was utilized to evaluate flow at rest and with distal augmentation maneuvers. COMPARISON:  None. FINDINGS: Contralateral Subclavian Vein: Respiratory phasicity is normal and symmetric with the symptomatic side. No evidence of thrombus. Normal compressibility. Internal Jugular Vein: There is extensive thrombus, which appears occlusive. The vein is not compressible. Subclavian Vein: No evidence of thrombus. Normal compressibility, respiratory phasicity and response to augmentation. Axillary Vein: No evidence of thrombus. Normal compressibility, respiratory phasicity and response to augmentation. Cephalic Vein: No evidence of thrombus. Normal compressibility, respiratory phasicity and response to augmentation. Basilic Vein: No evidence of thrombus. Normal compressibility, respiratory phasicity and response to augmentation. Brachial Veins: No evidence of thrombus. Normal compressibility, respiratory phasicity and response to augmentation. Radial Veins: No evidence of thrombus. Normal compressibility, respiratory phasicity and response to augmentation. Ulnar Veins: No evidence of thrombus. Normal compressibility, respiratory phasicity and response to augmentation. Venous Reflux:  None visualized. Other Findings:  None visualized. IMPRESSION: Acute occlusive thrombus of the left internal jugular vein. Electronically Signed: By: Esperanza Heir M.D. On: 04/01/2016 19:21     Medications:     . alteplase  2 mg Intracatheter Once  . amLODipine  5 mg Oral Daily  .  apixaban  10 mg Oral BID   And  . [START ON 04/09/2016] apixaban  5 mg Oral BID  . carvedilol  12.5 mg Oral Daily  . cinacalcet  30 mg Oral Q breakfast  . diclofenac sodium  2 g Topical QID  . enoxaparin (LOVENOX) injection  65 mg Subcutaneous Q24H  . hydrALAZINE  25 mg Oral TID  . isosorbide dinitrate  10 mg Oral TID  . multivitamin  1 tablet Oral Daily  . sertraline  100 mg Oral Daily  . sevelamer carbonate  1,600-2,400 mg Oral TID WC  . sodium chloride flush  3 mL Intravenous Q12H   acetaminophen **OR** acetaminophen, calcium carbonate (dosed in mg elemental calcium), hydrALAZINE, HYDROmorphone (DILAUDID) injection, LORazepam, ondansetron **OR** ondansetron (ZOFRAN) IV, oxyCODONE-acetaminophen  Assessment/ Plan:  25 y.o. 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 She was then placed back on hemodialysis. Patient has a ICD for CHF  1.  End-stage renal disease. [address] [facility]. Monday/Wednesday/Friday. [facility] - Severe hyperkalemia Patient was dialyzed emergently for severe hyperkalemia Potassium level is corrected today Next dialysis is anticipated on Monday  2. Complication of dialysis access Her femoral PermCath appears to be positional. Blood flow 300 was able to be achieved in the supine position - Her left arm AV fistula was recently revised surgically on May 31 by Dr. Wyn Quaker.  Significant pain and edema noted in left arm.  - started on anticoagulation for DVT for Acute occlusive thrombus of the left internal jugular vein. - Eliquis  3. Anemia of chronic kidney disease and sickle cell - current hemoglobin 8.9 - hold Procrit for a few days due to acute clot  4. Secondary hyperparathyroidism We will monitor her phosphorus during this hospital stay Resume cinacalcet and Renvela    LOS: 2 [name] Small 6/11/201710:12  AM

## 2016-04-02 NOTE — Care Management Note (Signed)
Case Management Note  Patient Details  Name: [name] Small MRN: [id number] Date of Birth: 04-19-1991  Subjective/Objective:      Provided with a coupon for Eliquis.              Action/Plan:   Expected Discharge Date:                  Expected Discharge Plan:     In-House Referral:     Discharge planning Services     Post Acute Care Choice:    Choice offered to:     DME Arranged:    DME Agency:     HH Arranged:    HH Agency:     Status of Service:     Medicare Important Message Given:    Date Medicare IM Given:    Medicare IM give by:    Date Additional Medicare IM Given:    Additional Medicare Important Message give by:     If discussed at Long Length of Stay Meetings, dates discussed:    Additional Comments:  Neiva Maenza A, RN 04/02/2016, 11:55 AM

## 2016-04-02 NOTE — Discharge Summary (Signed)
[facility] - [place] at [facility]   PATIENT NAME: [name] Small    MR#:  [id number]  DATE OF BIRTH:  05-Jun-1991  DATE OF ADMISSION:  03/31/2016 ADMITTING PHYSICIAN: Auburn Bilberry, MD  DATE OF DISCHARGE: 04/02/16  PRIMARY CARE PHYSICIAN: Jeanann Lewandowsky, MD    ADMISSION DIAGNOSIS:  Hyperkalemia [E87.5] Complications, dialysis, catheter, mechanical, initial encounter (HCC) [T82.49XA] AV fistula infection, initial encounter (HCC) [T82.7XXA]  DISCHARGE DIAGNOSIS:  Acute left Internal jugular vein DVT (on eliquis) ESRD on HD SECONDARY DIAGNOSIS:   Past Medical History  Diagnosis Date  . Hemodialysis patient (HCC)     Mon. Wed. Fri  . Hypertension   . Pulmonary emboli (HCC) 01/2012    Bilateral, moderate clot burden, areas of pulmonary infarction and central necrosis  . CHF (congestive heart failure) (HCC)   . Cardiomyopathy   . Dysrhythmia     at times per pt.  . Anemia   . H/O transfusion of packed red blood cells   . 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 Polycystic kidney disease   . Cellulitis and abscess of face 03/22/2013  . Renal insufficiency   . Chronic pain   . GERD (gastroesophageal reflux disease)   . Depression   . Hyperkalemia 09/2015  . Pelvic fracture (HCC)   . Osteoporosis   . Sickle cell anemia (HCC)   . Narcotic abuse, continuous   . 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 Anxiety   . Sickle cell anemia with crisis [facility]) March 2017    HOSPITAL COURSE:   25 y.o with ESRD with left upper fistula infection  1. Left upper extremity swelling due to acute Internal jugular vein DVT  -Seen by vascular surgery consult to evluate the fistula no evidence of infection. Easy empiric antibiotics. -Patient was started on Lovenox 1 mg/kg 3 times a day dosing for DVT treatment. Now  changed to oral eliquis White count normal. Patient does not have fever. Doubt there is infection. -Patient to be discharged to home okay with vascular -Pharmacy to discuss benefits and side effects of oral anticoagulation  2. Hyperkalemia: urgent HD per nephrology Resolved with hemodialysis  3. ESRD- hd as above  4. Accelerated hypertension continue coreg, hydralzine, imdure Add prn hydralazine  5. Depression- zoloft  6. Misc: heparin  Oral stable hemodynamically. DC home. CONSULTS OBTAINED:     DRUG ALLERGIES:   Allergies  Allergen Reactions  . Aspirin Other (See Comments)    Interacts with Coreg  . Tramadol Anaphylaxis  . Vicodin [Hydrocodone-Acetaminophen] Hives  . Buprenorphine Hcl Itching    Ok with oxycodone  . Iohexol Itching  . Morphine And Related Itching    Ok with oxycodone    DISCHARGE MEDICATIONS:   Current Discharge Medication List    START taking these medications   Details  !! apixaban (ELIQUIS) 5 MG TABS tablet Take 2 tablets (10 mg total) by mouth 2 (two) times daily. Till 04/08/16 Qty: 60 tablet, Refills: 1    !! apixaban (ELIQUIS) 5 MG TABS tablet Take 1 tablet (5 mg total) by mouth 2 (two) times daily. From 04/09/16 Qty: 60 tablet, Refills: 0     !! - Potential duplicate medications found. Please discuss with provider.    CONTINUE these medications which have CHANGED   Details  oxyCODONE-acetaminophen (PERCOCET) 7.5-325 MG tablet Take 1 tablet by mouth every 6 (  six) hours as needed for moderate pain or severe pain. Qty: 30 tablet, Refills: 0      CONTINUE these medications which have NOT CHANGED   Details  amLODipine (NORVASC) 5 MG tablet Take 1 tablet (5 mg total) by mouth daily. Qty: 30 tablet, Refills: 3   Associated Diagnoses: Chronic combined systolic and diastolic congestive heart failure (HCC)    calcium carbonate, dosed in mg elemental calcium, 1250 (500 Ca) MG/5ML Take 5 mLs (500 mg of elemental calcium total) by mouth every 6  (six) hours as needed for indigestion. Qty: 450 mL, Refills: 30    carvedilol (COREG) 6.25 MG tablet Take 12.5 mg by mouth daily.  Refills: 3    cinacalcet (SENSIPAR) 30 MG tablet Take 1 tablet (30 mg total) by mouth daily with breakfast. Qty: 90 tablet, Refills: 3   Associated Diagnoses: ESRD on dialysis (HCC)    diclofenac sodium (VOLTAREN) 1 % GEL Apply 2 g topically 4 (four) times daily. Qty: 1 Tube, Refills: 3    hydrALAZINE (APRESOLINE) 25 MG tablet Take 1 tablet (25 mg total) by mouth 3 (three) times daily. Qty: 90 tablet, Refills: 3   Associated Diagnoses: Chronic combined systolic and diastolic congestive heart failure (HCC)    isosorbide dinitrate (ISORDIL) 10 MG tablet Take 1 tablet (10 mg total) by mouth 3 (three) times daily. Qty: 90 tablet, Refills: 3   Associated Diagnoses: Chronic combined systolic and diastolic congestive heart failure (HCC)    LORazepam (ATIVAN) 1 MG tablet Take 1 tablet (1 mg total) by mouth daily as needed for anxiety. Qty: 30 tablet, Refills: 0   Associated Diagnoses: ESRD on dialysis (HCC)    multivitamin (RENA-VIT) TABS tablet Take 1 tablet by mouth daily. Qty: 90 tablet, Refills: 3   Associated Diagnoses: Sickle cell anemia with pain (HCC)    sertraline (ZOLOFT) 100 MG tablet Take 1 tablet (100 mg total) by mouth daily. Qty: 30 tablet, Refills: 3    sevelamer carbonate (RENVELA) 800 MG tablet Take 2-3 tablets (1,600-2,400 mg total) by mouth 3 (three) times daily with meals. 3 tabs with meals, 2 tabs with snacks Qty: 120 tablet, Refills: 3   Associated Diagnoses: ESRD on dialysis (HCC)      STOP taking these medications     oxyCODONE (ROXICODONE) 5 MG immediate release tablet         If you experience worsening of your admission symptoms, develop shortness of breath, life threatening emergency, suicidal or homicidal thoughts you must seek medical attention immediately by calling 911 or calling your MD immediately  if symptoms less  severe.  You Must read complete instructions/literature along with all the possible adverse reactions/side effects for all the Medicines you take and that have been prescribed to you. Take any new Medicines after you have completely understood and accept all the possible adverse reactions/side effects.   Please note  You were cared for by a hospitalist during your hospital stay. If you have any questions about your discharge medications or the care you received while you were in the hospital after you are discharged, you can call the unit and asked to speak with the hospitalist on call if the hospitalist that took care of you is not available. Once you are discharged, your primary care physician will handle any further medical issues. Please note that NO REFILLS for any discharge medications will be authorized once you are discharged, as it is imperative that you return to your primary care physician (or establish a relationship  with a primary care physician if you do not have one) for your aftercare needs so that they can reassess your need for medications and monitor your lab values. Today   SUBJECTIVE   Doing ok  VITAL SIGNS:  Blood pressure 143/112, pulse 96, temperature 98.5 F (36.9 C), temperature source Oral, resp. rate 16, height 5\' 3"  (1.6 m), weight 64.275 kg (141 lb 11.2 oz), SpO2 95 %.  I/O:   Intake/Output Summary (Last 24 hours) at 04/02/16 1025 Last data filed at 04/02/16 0502  Gross per 24 hour  Intake    410 ml  Output      0 ml  Net    410 ml    PHYSICAL EXAMINATION:  GENERAL:  25 y.o.-year-old patient lying in the bed with no acute distress.  EYES: Pupils equal, round, reactive to light and accommodation. No scleral icterus. Extraocular muscles intact.  HEENT: Head atraumatic, normocephalic. Oropharynx and nasopharynx clear.  NECK:  Supple, no jugular venous distention. No thyroid enlargement, no tenderness.  LUNGS: Normal breath sounds bilaterally, no wheezing,  rales,rhonchi or crepitation. No use of accessory muscles of respiration.  CARDIOVASCULAR: S1, S2 normal. No murmurs, rubs, or gallops.  ABDOMEN: Soft, non-tender, non-distended. Bowel sounds present. No organomegaly or mass.  EXTREMITIES: No pedal edema, cyanosis, or clubbing. Left upper extremity swelling. AV fistula present. Good thrill. NEUROLOGIC: Cranial nerves II through XII are intact. Muscle strength 5/5 in all extremities. Sensation intact. Gait not checked.  PSYCHIATRIC: The patient is alert and oriented x 3.  SKIN: No obvious rash, lesion, or ulcer.   DATA REVIEW:   CBC   Recent Labs Lab 04/02/16 0618  WBC 5.2  HGB 8.9*  HCT 26.4*  PLT 189    Chemistries   Recent Labs Lab 03/31/16 1620 04/01/16 0443  NA 136 138  K 6.3* 4.4  CL 100* 99*  CO2 20* 24  GLUCOSE 86 97  BUN 104* 76*  CREATININE 12.76* 9.88*  CALCIUM 9.7 10.1  AST 26  --   ALT 5*  --   ALKPHOS 344*  --   BILITOT 0.9  --     Microbiology Results   Recent Results (from the past 240 hour(s))  Blood culture (routine x 2)     Status: None (Preliminary result)   Collection Time: 03/31/16  6:15 PM  Result Value Ref Range Status   Specimen Description BLOOD RIGHT EJ  Final   Special Requests BOTTLES DRAWN AEROBIC AND ANAEROBIC  4CC  Final   Culture NO GROWTH < 24 HOURS  Final   Report Status PENDING  Incomplete  Blood culture (routine x 2)     Status: None (Preliminary result)   Collection Time: 03/31/16  6:30 PM  Result Value Ref Range Status   Specimen Description BLOOD RIGHT EJ  Final   Special Requests BOTTLES DRAWN AEROBIC AND ANAEROBIC  3CC  Final   Culture NO GROWTH < 24 HOURS  Final   Report Status PENDING  Incomplete    RADIOLOGY:  US Venous Img Upper Uni Left  04/01/2016  ADDENDUM REPORT: 04/01/2016 19:38 ADDENDUM: Dr. Delton Coombes confirmed this patient is on her service at 7:37pm. At 7:38, I also gave these results to Dr. Wyn Quaker. Electronically Signed   By: Esperanza Heir M.D.   On:  04/01/2016 19:38  04/01/2016  ADDENDUM REPORT: 04/01/2016 19:33 ADDENDUM: Critical Value/emergent results were called by telephone at the time of interpretation on 04/01/2016 at 7:32 pm to the on call hospitalist, who verbally  acknowledged these results. However, we are also attempting to contact the ordering physician. Electronically Signed   By: Esperanza Heir M.D.   On: 04/01/2016 19:33  04/01/2016  CLINICAL DATA:  Left arm swelling for six days EXAM: LEFT UPPER EXTREMITY VENOUS DOPPLER ULTRASOUND TECHNIQUE: Gray-scale sonography with graded compression, as well as color Doppler and duplex ultrasound were performed to evaluate the upper extremity deep venous system from the level of the subclavian vein and including the jugular, axillary, basilic, radial, ulnar and upper cephalic vein. Spectral Doppler was utilized to evaluate flow at rest and with distal augmentation maneuvers. COMPARISON:  None. FINDINGS: Contralateral Subclavian Vein: Respiratory phasicity is normal and symmetric with the symptomatic side. No evidence of thrombus. Normal compressibility. Internal Jugular Vein: There is extensive thrombus, which appears occlusive. The vein is not compressible. Subclavian Vein: No evidence of thrombus. Normal compressibility, respiratory phasicity and response to augmentation. Axillary Vein: No evidence of thrombus. Normal compressibility, respiratory phasicity and response to augmentation. Cephalic Vein: No evidence of thrombus. Normal compressibility, respiratory phasicity and response to augmentation. Basilic Vein: No evidence of thrombus. Normal compressibility, respiratory phasicity and response to augmentation. Brachial Veins: No evidence of thrombus. Normal compressibility, respiratory phasicity and response to augmentation. Radial Veins: No evidence of thrombus. Normal compressibility, respiratory phasicity and response to augmentation. Ulnar Veins: No evidence of thrombus. Normal compressibility,  respiratory phasicity and response to augmentation. Venous Reflux:  None visualized. Other Findings:  None visualized. IMPRESSION: Acute occlusive thrombus of the left internal jugular vein. Electronically Signed: By: Esperanza Heir M.D. On: 04/01/2016 19:21     Management plans discussed with the patient, family and they are in agreement.  CODE STATUS:     Code Status Orders        Start     Ordered   03/31/16 2021  Full code   Continuous     03/31/16 2020    Code Status History    Date Active Date Inactive Code Status Order ID Comments User Context   01/17/2016 10:54 PM 01/18/2016  7:52 PM Full Code [id number]  Eduard Clos, MD Inpatient   01/08/2016 11:10 PM 01/11/2016  7:44 PM Full Code [id number]  Rhetta Mura, MD Inpatient   01/02/2016  9:33 PM 01/03/2016  8:27 PM Full Code [id number]  Eduard Clos, MD Inpatient   10/27/2015 11:27 PM 10/29/2015  7:21 PM Full Code [id number]  Campbell Stall, MD Inpatient   10/11/2015  5:44 AM 10/12/2015  8:55 PM Full Code [id number]  Bobette Mo, MD ED   10/04/2015 12:44 AM 10/05/2015  7:19 PM Full Code [id number]  Ron Parker, MD Inpatient   09/21/2015 12:24 PM 09/25/2015  9:42 PM Full Code [id number]  Meredith Pel, NP Inpatient   04/02/2015  9:53 PM 04/05/2015  7:27 PM Full Code [id number]  Lorretta Harp, MD Inpatient   03/02/2015 10:04 PM 03/04/2015  7:28 PM Full Code [id number]  Rolly Salter, MD Inpatient   02/21/2015  6:29 PM 02/25/2015  8:23 PM Full Code [id number]  Maretta Bees, MD Inpatient   01/26/2015 12:01 PM 01/29/2015  6:47 PM Full Code [id number]  Clydia Llano, MD Inpatient   04/22/2013  9:57 PM 05/01/2013  3:24 PM Full Code [id number]  Eduard Clos, MD Inpatient   03/21/2013  4:15 AM 03/24/2013  7:34 PM Full Code [id number]  Hillary Bow, DO ED   02/20/2012  9:54 PM 02/27/2012  5:13 PM Full Code [id number]  Lucilla Edin, RN  Inpatient   02/03/2012  3:16 AM 02/14/2012  4:44 PM Full Code [id number]  Mirian Mo, RN  Inpatient      TOTAL TIME TAKING CARE OF THIS PATIENT: 40 minutes.    Armine Rizzolo M.D on 04/02/2016 at 10:25 AM  Between 7am to 6pm - Pager - [phone] After 6pm go to [email or web address] - password [email or web address] [facility]  [facility] [place] [facility]  Office  [phone]  CC: Primary care physician; Jeanann Lewandowsky, MD

## 2016-04-02 NOTE — Discharge Instructions (Signed)
Take medications as prescribed.  Please be sure to contact your doctor if you notice increased redness and/or swelling of your left arm and neck.  Also report any abnormal bleeding and notify your doctor of any questions or concerns.  Try keep left arm elevated

## 2016-04-02 NOTE — Progress Notes (Signed)
[facility]  Daily Progress Note   Subjective  - * No surgery found *  Duplex was positive for jugular vein DVT. Started on anticoagulation. No major events overnight.  Obj ective Filed Vitals:   04/01/16 1715 04/01/16 2108 04/02/16 0502 04/02/16 0914  BP: 154/103 156/103 139/100 143/112  Pulse: 101 103 99 96  Temp:  98.1 F (36.7 C) 98.5 F (36.9 C)   TempSrc:  Oral Oral   Resp:  17 16   Height:      Weight:      SpO2: 96% 96% 100% 95%    Intake/Output Summary (Last 24 hours) at 04/02/16 0937 Last data filed at 04/02/16 0502  Gross per 24 hour  Intake    410 ml  Output      0 ml  Net    410 ml    PULM  CTAB CV  RRR VASC  2-3+ left upper extremity swelling. Good thrill present in her access  Laboratory CBC    Component Value Date/Time   WBC 5.2 04/02/2016 0618   HGB 8.9* 04/02/2016 0618   HCT 26.4* 04/02/2016 0618   PLT 189 04/02/2016 0618    BMET    Component Value Date/Time   NA 138 04/01/2016 0443   K 4.4 04/01/2016 0443   CL 99* 04/01/2016 0443   CO2 24 04/01/2016 0443   GLUCOSE 97 04/01/2016 0443   BUN 76* 04/01/2016 0443   CREATININE 9.88* 04/01/2016 0443   CALCIUM 10.1 04/01/2016 0443   GFRNONAA 5* 04/01/2016 0443   GFRAA 6* 04/01/2016 0443    Assessment/Planning: Status post revision of left upper arm AV fistula with Artegraft about 2 weeks ago. Prominent neck and arm swelling. Found to have a jugular vein DVT on duplex so would also suspect a central vein component.   Recommend oral anticoagulation for at least 3 months.  No intervention planned at this time although in the future, a fistulogram with further evaluation and potential treatment may be helpful once her arm swelling has resolved.  Return to my office in 1-2 weeks for follow-up.  Okay to discharge on oral anticoagulation    Jaskiran Pata  04/02/2016, 9:37 AM

## 2016-04-02 NOTE — Care Management Note (Signed)
Case Management Note  Patient Details  Name: Tikisha Benedum MRN: [id number] Date of Birth: 08/18/1991  Subjective/Objective:       Received call from Ms Lehmkuhl Mother who stated that she is on her way to pick up [place].              Action/Plan:   Expected Discharge Date:                  Expected Discharge Plan:     In-House Referral:     Discharge planning Services     Post Acute Care Choice:    Choice offered to:     DME Arranged:    DME Agency:     HH Arranged:    HH Agency:     Status of Service:     Medicare Important Message Given:    Date Medicare IM Given:    Medicare IM give by:    Date Additional Medicare IM Given:    Additional Medicare Important Message give by:     If discussed at Long Length of Stay Meetings, dates discussed:    Additional Comments:  Elzena Muston A, RN 04/02/2016, 4:38 PM

## 2016-04-05 LAB — CULTURE, BLOOD (ROUTINE X 2)
CULTURE: NO GROWTH
CULTURE: NO GROWTH

## 2016-04-06 ENCOUNTER — Encounter: Payer: Self-pay | Admitting: Family Medicine

## 2016-04-06 ENCOUNTER — Ambulatory Visit (INDEPENDENT_AMBULATORY_CARE_PROVIDER_SITE_OTHER): Payer: Medicare Other | Admitting: Family Medicine

## 2016-04-06 DIAGNOSIS — Z992 Dependence on renal dialysis: Secondary | ICD-10-CM | POA: Diagnosis not present

## 2016-04-06 DIAGNOSIS — M545 Low back pain, unspecified: Secondary | ICD-10-CM

## 2016-04-06 DIAGNOSIS — F411 Generalized anxiety disorder: Secondary | ICD-10-CM | POA: Diagnosis not present

## 2016-04-06 DIAGNOSIS — N186 End stage renal disease: Secondary | ICD-10-CM

## 2016-04-06 DIAGNOSIS — F329 Major depressive disorder, single episode, unspecified: Secondary | ICD-10-CM

## 2016-04-06 DIAGNOSIS — F32A Depression, unspecified: Secondary | ICD-10-CM

## 2016-04-06 DIAGNOSIS — G894 Chronic pain syndrome: Secondary | ICD-10-CM

## 2016-04-06 MED ORDER — OXYCODONE HCL 5 MG PO TABS: 5.0000 mg | ORAL_TABLET | Freq: Four times a day (QID) | ORAL | Status: DC | PRN

## 2016-04-06 MED ORDER — LORAZEPAM 1 MG PO TABS: 1.0000 mg | ORAL_TABLET | Freq: Every day | ORAL | Status: DC | PRN

## 2016-04-06 MED FILL — LORazepam 1 MG TABS: 1 | 30 days supply | Qty: 30 | Fill #0

## 2016-04-06 MED FILL — oxyCODONE HCL 5 MG TABS: 5 | 7 days supply | Qty: 30 | Fill #0

## 2016-04-06 NOTE — Progress Notes (Signed)
Subjective:    Patient ID: [name] Small, female    DOB: [birth year], 25 y.o.   MRN: [id number]  HPI [name], a 25 year old female with a history of end stage renal disease with dialysis, anxiety and chronic pain syndrome presents for a 1 month follow up of chronic conditions. Ms. Dsouza is currently followed by nephrology and has dialysis on Monday, Wednesday, and Friday. She was recently hospitalized on 03/31/2016 with swelling to her left upper arm. She had an AV fisula placed 2 weeks prior to hospital admission.   Ms. Ruff is also complaining of ongoing depression and anxiety. She says that symptoms have increased since moving to her own apartment. She spends a great deal of time alone. She says that she has not been to counseling in the past due to fear of stigma and judgement. She has been taking Ativan daily and she says that she does not take Zoloft consistently. She only takes Zoloft when she feels that she needs it.  She has the following symptoms: difficulty concentrating, irritable, racing thoughts.  She denies current suicidal and homicidal ideation.  She is also complaining of low back pain primarily on right side. She says that her boyfriend gave her a "bear hug" several weeks ago and she has been experiencing low back pain since that time. She describes pain as intermittent and aching. Current pain intensity is 6/10. She has a history of osteopenia and bone disease. She says that back pain is aggravated by lying, sitting, standing, and pivoting movements. She denies fever or fatigue. She has a history of chronic pain syndrome and has been taking Oxycodone with moderate relief.  Past Medical History  Diagnosis Date  . Hemodialysis patient (HCC)     Mon. Wed. Fri  . Hypertension   . Pulmonary emboli (HCC) 01/2012    Bilateral, moderate clot burden, areas of pulmonary infarction and central necrosis  . CHF (congestive heart failure) (HCC)   . Cardiomyopathy   .  Dysrhythmia     at times per pt.  . Anemia   . H/O transfusion of packed red blood cells   . 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 Polycystic kidney disease   . Cellulitis and abscess of face 03/22/2013  . Renal insufficiency   . Chronic pain   . GERD (gastroesophageal reflux disease)   . Depression   . Hyperkalemia 09/2015  . Pelvic fracture (HCC)   . Osteoporosis   . Narcotic abuse, continuous   . 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 Anxiety     Review of Systems  Constitutional: Negative.  Negative for fever, diaphoresis and fatigue.  HENT: Negative.   Eyes: Negative.   Respiratory: Negative.  Negative for apnea and shortness of breath.   Cardiovascular: Negative.   Gastrointestinal: Negative.   Endocrine: Negative.   Genitourinary: Negative.   Musculoskeletal: Positive for myalgias and back pain.  Skin: Negative.   Allergic/Immunologic: Negative.   Neurological: Negative.   Hematological: Negative.   Psychiatric/Behavioral: Negative for suicidal ideas and sleep disturbance. The patient is nervous/anxious.        Objective:   Physical Exam  Constitutional: She is oriented to person, place, and time. She appears well-developed and well-nourished.  HENT:  Head: Normocephalic and atraumatic.  Right Ear: External ear normal.  Left Ear: External ear normal.  Nose: Nose  normal.  Mouth/Throat: Oropharynx is clear and moist.  Eyes: Conjunctivae and EOM are normal. Pupils are equal, round, and reactive to light.  Neck: Normal range of motion. Neck supple.  Cardiovascular: Normal rate, regular rhythm, normal heart sounds and intact distal pulses.   Pulmonary/Chest: Effort normal and breath sounds normal.  Abdominal: Soft. Bowel sounds are normal.  Musculoskeletal:       Right shoulder: She exhibits decreased range of  motion, tenderness and pain. She exhibits no spasm.  Neurological: She is alert and oriented to person, place, and time. She has normal reflexes.  Skin: Skin is warm and dry.     Psychiatric: Her behavior is normal. Judgment and thought content normal. She exhibits a depressed mood.      BP 174/112 mmHg  Pulse 118  Temp(Src) 98.8 F (37.1 C) (Oral)  Resp 16  Ht 5\' 3"  (1.6 m)  Wt 126 lb (57.153 kg)  BMI 22.33 kg/m2  SpO2 100% Assessment & Plan:   1. ESRD on dialysis [facility]) Continue dialysis as previously scheduled.  - LORazepam (ATIVAN) 1 MG tablet; Take 1 tablet (1 mg total) by mouth daily as needed for anxiety.  Dispense: 30 tablet; Refill: 0  2. Chronic pain syndrome Will send a referral to pain management for chronic pain syndrome.  - oxyCODONE (OXY IR/ROXICODONE) 5 MG immediate release tablet; Take 1 tablet (5 mg total) by mouth every 6 (six) hours as needed for severe pain.  Dispense: 30 tablet; Refill: 0 - Ambulatory referral to Pain Clinic  3. Acute low back pain Recommend that patient apply warm, moist compresses to low back 20 minutes 4 times per day as needed. Recommend Tylenol 500 mg every 6 hours as needed for mild to moderate pain.  - DG Lumbar Spine Complete; Future  4. Generalized anxiety disorder GAD 7 : Generalized Anxiety Score 04/06/2016  Nervous, Anxious, on Edge 2  Control/stop worrying 1  Worry too much - different things 1  Trouble relaxing 2  Restless 2  Easily annoyed or irritable 1  Afraid - awful might happen 1  Total GAD 7 Score 10   - LORazepam (ATIVAN) 1 MG tablet; Take 1 tablet (1 mg total) by mouth daily as needed for anxiety.  Dispense: 30 tablet; Refill: 0  5. Depression Recommend that Ms. Krigbaum take Zoloft as prescribed. Explained that medication is ineffective if she takes it when sporadically. She expressed understanding. She denies suicidal or homicidal intent.  Depression screen [facility] 2/9 04/06/2016 04/06/2016 02/29/2016 02/01/2016  01/04/2016  Decreased Interest 1 0 0 0 0  Down, Depressed, Hopeless 1 0 0 0 0  PHQ - 2 Score 2 0 0 0 0  Altered sleeping 0 - - - -  Tired, decreased energy 1 - - - -  Change in appetite 0 - - - -  Feeling bad or failure about yourself  0 - - - -  Trouble concentrating 2 - - - -  Moving slowly or fidgety/restless 0 - - - -  Suicidal thoughts 0 - - - -  PHQ-9 Score 5 - - - -     RTC: 1 month for depression and anxiety/ chronic pain syndrome   Haydon Kalmar M, FNP     The patient was given clear instructions to go to ER or return to medical center if symptoms do not improve, worsen or new problems develop. The patient verbalized understanding. Will notify patient with laboratory results.

## 2016-04-07 NOTE — Patient Instructions (Signed)
Generalized Anxiety Disorder Generalized anxiety disorder (GAD) is a mental disorder. It interferes with life functions, including relationships, work, and school. GAD is different from normal anxiety, which everyone experiences at some point in their lives in response to specific life events and activities. Normal anxiety actually helps [place] prepare for and get through these life events and activities. Normal anxiety goes away after the event or activity is over.  GAD causes anxiety that is not necessarily related to specific events or activities. It also causes excess anxiety in proportion to specific events or activities. The anxiety associated with GAD is also difficult to control. GAD can vary from mild to severe. People with severe GAD can have intense waves of anxiety with physical symptoms (panic attacks).  SYMPTOMS The anxiety and worry associated with GAD are difficult to control. This anxiety and worry are related to many life events and activities and also occur more days than not for 6 months or longer. People with GAD also have three or more of the following symptoms (one or more in children):  Restlessness.   Fatigue.  Difficulty concentrating.   Irritability.  Muscle tension.  Difficulty sleeping or unsatisfying sleep. DIAGNOSIS GAD is diagnosed through an assessment by your health care provider. Your health care provider will ask you questions aboutyour mood,physical symptoms, and events in your life. Your health care provider may ask you about your medical history and use of alcohol or drugs, including prescription medicines. Your health care provider may also do a physical exam and blood tests. Certain medical conditions and the use of certain substances can cause symptoms similar to those associated with GAD. Your health care provider may refer you to a mental health specialist for further evaluation. TREATMENT The following therapies are usually used to treat GAD:    Medication. Antidepressant medication usually is prescribed for long-term daily control. Antianxiety medicines may be added in severe cases, especially when panic attacks occur.   Talk therapy (psychotherapy). Certain types of talk therapy can be helpful in treating GAD by providing support, education, and guidance. A form of talk therapy called cognitive behavioral therapy can teach you healthy ways to think about and react to daily life events and activities.  Stress managementtechniques. These include yoga, meditation, and exercise and can be very helpful when they are practiced regularly. A mental health specialist can help determine which treatment is best for you. Some people see improvement with one therapy. However, other people require a combination of therapies.   This information is not intended to replace advice given to you by your health care provider. Make sure you discuss any questions you have with your health care provider.   Document Released: 02/03/2013 Document Revised: 10/30/2014 Document Reviewed: 02/03/2013 Elsevier Interactive Patient Education 2016 Elsevier Inc. End-Stage Kidney Disease The kidneys are two organs that lie on either side of the spine between the middle of the back and the front of the abdomen. The kidneys:   Remove wastes and extra water from the blood.   Produce important hormones. These help keep bones strong, regulate blood pressure, and help create red blood cells.   Balance the fluids and chemicals in the blood and tissues. End-stage kidney disease occurs when the kidneys are so damaged that they cannot do their job. When the kidneys cannot do their job, life-threatening problems occur. The body cannot stay clean and strong without the help of the kidneys. In end-stage kidney disease, the kidneys cannot get better.You need a new kidney or treatments  to do some of the work healthy kidneys do in order to stay alive. CAUSES  End-stage kidney  disease usually occurs when a long-lasting (chronic) kidney disease gets worse. It may also occur after the kidneys are suddenly damaged (acute kidney injury).  SYMPTOMS   Swelling (edema) of the legs, ankles, or feet.   Tiredness (lethargy).   Nausea or vomiting.   Confusion.   Problems with urination, such as:   Decreased urine production.   Frequent urination, especially at night.   Frequent accidents in children who are potty trained.   Muscle twitches and cramps.   Persistent itchiness.   Loss of appetite.   Headaches.   Abnormally dark or light skin.   Numbness in the hands or feet.   Easy bruising.   Frequent hiccups.   Menstruation stops. DIAGNOSIS  Your health care provider will measure your blood pressure and take some tests. These may include:   Urine tests.   Blood tests.   Imaging tests, such as:   An ultrasound exam.   Computed tomography (CT).  A kidney biopsy. TREATMENT  There are two treatments for end-stage kidney disease:   A procedure that removes toxic wastes from the body (dialysis).   Receiving a new kidney (kidney transplant). Both of these treatments have serious risks and consequences. Your health care provider will help you determine which treatment is best for you based on your health, age, and other factors. In addition to having dialysis or a kidney transplant, you may need to take medicines to control high blood pressure (hypertension) and cholesterol and to decrease phosphorus levels in your blood.  HOME CARE INSTRUCTIONS  Follow your prescribed diet.   Take medicines only as directed by your health care provider.   Do not take any new medicines (prescription, over-the-counter, or nutritional supplements) unless approved by your health care provider. Many medicines can worsen your kidney damage or need to have the dose adjusted.   Keep all follow-up visits as directed by your health care  provider. MAKE SURE YOU:  Understand these instructions.  Will watch your condition.  Will get help right away if you are not doing well or get worse.   This information is not intended to replace advice given to you by your health care provider. Make sure you discuss any questions you have with your health care provider.   Document Released: 12/30/2003 Document Revised: 10/30/2014 Document Reviewed: 06/07/2012 Elsevier Interactive Patient Education [employer]

## 2016-04-11 ENCOUNTER — Emergency Department (HOSPITAL_BASED_OUTPATIENT_CLINIC_OR_DEPARTMENT_OTHER): Payer: Medicare Other

## 2016-04-11 ENCOUNTER — Inpatient Hospital Stay (HOSPITAL_COMMUNITY): Payer: Medicare Other

## 2016-04-11 ENCOUNTER — Inpatient Hospital Stay (HOSPITAL_BASED_OUTPATIENT_CLINIC_OR_DEPARTMENT_OTHER)
Admission: EM | Admit: 2016-04-11 | Discharge: 2016-04-14 | DRG: 314 | Disposition: A | Discharge status: Home / Self Care | Payer: Medicare Other | Attending: Family Medicine | Admitting: Family Medicine

## 2016-04-11 ENCOUNTER — Encounter (HOSPITAL_BASED_OUTPATIENT_CLINIC_OR_DEPARTMENT_OTHER): Payer: Self-pay | Admitting: Emergency Medicine

## 2016-04-11 DIAGNOSIS — G894 Chronic pain syndrome: Secondary | ICD-10-CM | POA: Diagnosis present

## 2016-04-11 DIAGNOSIS — N185 Chronic kidney disease, stage 5: Secondary | ICD-10-CM | POA: Diagnosis present

## 2016-04-11 DIAGNOSIS — R0781 Pleurodynia: Secondary | ICD-10-CM | POA: Diagnosis not present

## 2016-04-11 DIAGNOSIS — D571 Sickle-cell disease without crisis: Secondary | ICD-10-CM | POA: Diagnosis present

## 2016-04-11 DIAGNOSIS — K219 Gastro-esophageal reflux disease without esophagitis: Secondary | ICD-10-CM | POA: Diagnosis present

## 2016-04-11 DIAGNOSIS — F419 Anxiety disorder, unspecified: Secondary | ICD-10-CM | POA: Diagnosis present

## 2016-04-11 DIAGNOSIS — R778 Other specified abnormalities of plasma proteins: Secondary | ICD-10-CM | POA: Diagnosis present

## 2016-04-11 DIAGNOSIS — I313 Pericardial effusion (noninflammatory): Principal | ICD-10-CM | POA: Diagnosis present

## 2016-04-11 DIAGNOSIS — Z87891 Personal history of nicotine dependence: Secondary | ICD-10-CM

## 2016-04-11 DIAGNOSIS — Z86711 Personal history of pulmonary embolism: Secondary | ICD-10-CM | POA: Diagnosis not present

## 2016-04-11 DIAGNOSIS — I429 Cardiomyopathy, unspecified: Secondary | ICD-10-CM | POA: Diagnosis present

## 2016-04-11 DIAGNOSIS — Z888 Allergy status to other drugs, medicaments and biological substances status: Secondary | ICD-10-CM | POA: Diagnosis not present

## 2016-04-11 DIAGNOSIS — T8612 Kidney transplant failure: Secondary | ICD-10-CM | POA: Diagnosis present

## 2016-04-11 DIAGNOSIS — I132 Hypertensive heart and chronic kidney disease with heart failure and with stage 5 chronic kidney disease, or end stage renal disease: Secondary | ICD-10-CM | POA: Diagnosis present

## 2016-04-11 DIAGNOSIS — N186 End stage renal disease: Secondary | ICD-10-CM

## 2016-04-11 DIAGNOSIS — I5023 Acute on chronic systolic (congestive) heart failure: Secondary | ICD-10-CM | POA: Diagnosis present

## 2016-04-11 DIAGNOSIS — E349 Endocrine disorder, unspecified: Secondary | ICD-10-CM | POA: Diagnosis present

## 2016-04-11 DIAGNOSIS — Z886 Allergy status to analgesic agent status: Secondary | ICD-10-CM

## 2016-04-11 DIAGNOSIS — M810 Age-related osteoporosis without current pathological fracture: Secondary | ICD-10-CM | POA: Diagnosis present

## 2016-04-11 DIAGNOSIS — R7989 Other specified abnormal findings of blood chemistry: Secondary | ICD-10-CM | POA: Diagnosis present

## 2016-04-11 DIAGNOSIS — Z9119 Patient's noncompliance with other medical treatment and regimen: Secondary | ICD-10-CM

## 2016-04-11 DIAGNOSIS — I5022 Chronic systolic (congestive) heart failure: Secondary | ICD-10-CM | POA: Diagnosis present

## 2016-04-11 DIAGNOSIS — I82402 Acute embolism and thrombosis of unspecified deep veins of left lower extremity: Secondary | ICD-10-CM | POA: Diagnosis present

## 2016-04-11 DIAGNOSIS — I319 Disease of pericardium, unspecified: Secondary | ICD-10-CM

## 2016-04-11 DIAGNOSIS — Z7901 Long term (current) use of anticoagulants: Secondary | ICD-10-CM

## 2016-04-11 DIAGNOSIS — Z95810 Presence of automatic (implantable) cardiac defibrillator: Secondary | ICD-10-CM | POA: Diagnosis present

## 2016-04-11 DIAGNOSIS — Y830 Surgical operation with transplant of whole organ as the cause of abnormal reaction of the patient, or of later complication, without mention of misadventure at the time of the procedure: Secondary | ICD-10-CM | POA: Diagnosis present

## 2016-04-11 DIAGNOSIS — I428 Other cardiomyopathies: Secondary | ICD-10-CM

## 2016-04-11 DIAGNOSIS — F329 Major depressive disorder, single episode, unspecified: Secondary | ICD-10-CM | POA: Diagnosis present

## 2016-04-11 DIAGNOSIS — Z8271 Family history of polycystic kidney: Secondary | ICD-10-CM

## 2016-04-11 DIAGNOSIS — Z86718 Personal history of other venous thrombosis and embolism: Secondary | ICD-10-CM

## 2016-04-11 DIAGNOSIS — I82C19 Acute embolism and thrombosis of unspecified internal jugular vein: Secondary | ICD-10-CM | POA: Diagnosis present

## 2016-04-11 DIAGNOSIS — R609 Edema, unspecified: Secondary | ICD-10-CM | POA: Diagnosis present

## 2016-04-11 DIAGNOSIS — Z992 Dependence on renal dialysis: Secondary | ICD-10-CM | POA: Diagnosis not present

## 2016-04-11 DIAGNOSIS — R079 Chest pain, unspecified: Secondary | ICD-10-CM | POA: Diagnosis present

## 2016-04-11 DIAGNOSIS — I82622 Acute embolism and thrombosis of deep veins of left upper extremity: Secondary | ICD-10-CM | POA: Diagnosis present

## 2016-04-11 DIAGNOSIS — Z885 Allergy status to narcotic agent status: Secondary | ICD-10-CM | POA: Diagnosis not present

## 2016-04-11 DIAGNOSIS — Z79899 Other long term (current) drug therapy: Secondary | ICD-10-CM | POA: Diagnosis not present

## 2016-04-11 DIAGNOSIS — R071 Chest pain on breathing: Secondary | ICD-10-CM | POA: Diagnosis not present

## 2016-04-11 DIAGNOSIS — I160 Hypertensive urgency: Secondary | ICD-10-CM | POA: Diagnosis present

## 2016-04-11 DIAGNOSIS — D631 Anemia in chronic kidney disease: Secondary | ICD-10-CM | POA: Diagnosis present

## 2016-04-11 DIAGNOSIS — I3139 Other pericardial effusion (noninflammatory): Secondary | ICD-10-CM | POA: Diagnosis present

## 2016-04-11 DIAGNOSIS — E8770 Fluid overload, unspecified: Secondary | ICD-10-CM | POA: Diagnosis present

## 2016-04-11 DIAGNOSIS — R600 Localized edema: Secondary | ICD-10-CM | POA: Diagnosis present

## 2016-04-11 HISTORY — DX: Dependence on renal dialysis: Z99.2

## 2016-04-11 HISTORY — DX: Long term (current) use of anticoagulants: Z79.01

## 2016-04-11 HISTORY — DX: End stage renal disease: N18.6

## 2016-04-11 LAB — CBC
HEMATOCRIT: 29.3 % — AB (ref 36.0–46.0)
HEMOGLOBIN: 9.5 g/dL — AB (ref 12.0–15.0)
MCH: 29.5 pg (ref 26.0–34.0)
MCHC: 32.4 g/dL (ref 30.0–36.0)
MCV: 91 fL (ref 78.0–100.0)
Platelets: 264 10*3/uL (ref 150–400)
RBC: 3.22 MIL/uL — ABNORMAL LOW (ref 3.87–5.11)
RDW: 18.7 % — ABNORMAL HIGH (ref 11.5–15.5)
WBC: 5.9 10*3/uL (ref 4.0–10.5)

## 2016-04-11 LAB — TROPONIN I
TROPONIN I: 0.54 ng/mL — AB (ref <0.031)
Troponin I: 0.53 ng/mL (ref <0.031)

## 2016-04-11 LAB — BASIC METABOLIC PANEL
ANION GAP: 18 — AB (ref 5–15)
BUN: 88 mg/dL — ABNORMAL HIGH (ref 6–20)
CALCIUM: 9.6 mg/dL (ref 8.9–10.3)
CO2: 20 mmol/L — AB (ref 22–32)
Chloride: 99 mmol/L — ABNORMAL LOW (ref 101–111)
Creatinine, Ser: 14.58 mg/dL — ABNORMAL HIGH (ref 0.44–1.00)
GFR calc Af Amer: 4 mL/min — ABNORMAL LOW (ref >60)
GFR calc non Af Amer: 3 mL/min — ABNORMAL LOW (ref >60)
GLUCOSE: 82 mg/dL (ref 65–99)
Potassium: 4.2 mmol/L (ref 3.5–5.1)
Sodium: 137 mmol/L (ref 135–145)

## 2016-04-11 LAB — BRAIN NATRIURETIC PEPTIDE: B Natriuretic Peptide: >4500 pg/mL — ABNORMAL HIGH (ref 0.0–100.0)

## 2016-04-11 LAB — HCG, QUANTITATIVE, PREGNANCY: hCG, Beta Chain, Quant, S: 33 m[IU]/mL — ABNORMAL HIGH (ref <5)

## 2016-04-11 LAB — MRSA PCR SCREENING: MRSA by PCR: NEGATIVE

## 2016-04-11 MED ORDER — HYDROMORPHONE HCL 1 MG/ML IJ SOLN
0.5000 mg | Freq: Once | INTRAMUSCULAR | Status: AC
  Administered 2016-04-11: 0.5 mg via INTRAVENOUS
  Filled 2016-04-11: qty 1

## 2016-04-11 MED ORDER — HYDRALAZINE HCL 25 MG PO TABS
25.0000 mg | ORAL_TABLET | Freq: Three times a day (TID) | ORAL | Status: DC
  Administered 2016-04-11 – 2016-04-13 (×6): 25 mg via ORAL
  Filled 2016-04-11 (×6): qty 1

## 2016-04-11 MED ORDER — HYDROCORTISONE NA SUCCINATE PF 100 MG IJ SOLR
200.0000 mg | Freq: Once | INTRAMUSCULAR | Status: AC
  Administered 2016-04-11: 200 mg via INTRAVENOUS
  Filled 2016-04-11: qty 4

## 2016-04-11 MED ORDER — ONDANSETRON HCL 4 MG/2ML IJ SOLN: 4.0000 mg | Freq: Four times a day (QID) | INTRAMUSCULAR | Status: DC | PRN

## 2016-04-11 MED ORDER — CINACALCET HCL 30 MG PO TABS
30.0000 mg | ORAL_TABLET | Freq: Every day | ORAL | Status: DC
  Administered 2016-04-12 – 2016-04-13 (×2): 30 mg via ORAL
  Filled 2016-04-11 (×3): qty 1

## 2016-04-11 MED ORDER — DIPHENHYDRAMINE HCL 50 MG/ML IJ SOLN
25.0000 mg | Freq: Once | INTRAMUSCULAR | Status: AC
  Administered 2016-04-11: 25 mg via INTRAVENOUS
  Filled 2016-04-11: qty 1

## 2016-04-11 MED ORDER — SEVELAMER CARBONATE 800 MG PO TABS
2400.0000 mg | ORAL_TABLET | Freq: Three times a day (TID) | ORAL | Status: DC
  Administered 2016-04-12 – 2016-04-13 (×4): 2400 mg via ORAL
  Filled 2016-04-11 (×5): qty 3

## 2016-04-11 MED ORDER — SODIUM CHLORIDE 0.9 % IV SOLN: 250.0000 mL | INTRAVENOUS | Status: DC | PRN

## 2016-04-11 MED ORDER — CLONIDINE HCL 0.1 MG PO TABS
0.1000 mg | ORAL_TABLET | Freq: Once | ORAL | Status: AC
  Administered 2016-04-11: 0.1 mg via ORAL
  Filled 2016-04-11: qty 1

## 2016-04-11 MED ORDER — AMLODIPINE BESYLATE 5 MG PO TABS
5.0000 mg | ORAL_TABLET | Freq: Every day | ORAL | Status: DC
  Administered 2016-04-13 – 2016-04-14 (×2): 5 mg via ORAL
  Filled 2016-04-11 (×3): qty 1

## 2016-04-11 MED ORDER — SEVELAMER CARBONATE 800 MG PO TABS: 1600.0000 mg | ORAL_TABLET | ORAL | Status: DC | PRN

## 2016-04-11 MED ORDER — CARVEDILOL 6.25 MG PO TABS
6.2500 mg | ORAL_TABLET | Freq: Two times a day (BID) | ORAL | Status: DC
  Administered 2016-04-11 – 2016-04-13 (×4): 6.25 mg via ORAL
  Filled 2016-04-11 (×4): qty 1

## 2016-04-11 MED ORDER — SODIUM CHLORIDE 0.9% FLUSH
3.0000 mL | Freq: Two times a day (BID) | INTRAVENOUS | Status: DC
  Administered 2016-04-11 – 2016-04-14 (×6): 3 mL via INTRAVENOUS

## 2016-04-11 MED ORDER — SODIUM CHLORIDE 0.9% FLUSH: 3.0000 mL | INTRAVENOUS | Status: DC | PRN

## 2016-04-11 MED ORDER — DIPHENHYDRAMINE HCL 50 MG/ML IJ SOLN
25.0000 mg | Freq: Four times a day (QID) | INTRAMUSCULAR | Status: DC | PRN
  Administered 2016-04-12 – 2016-04-14 (×7): 25 mg via INTRAVENOUS
  Filled 2016-04-11 (×6): qty 1

## 2016-04-11 MED ORDER — ACETAMINOPHEN 325 MG PO TABS: 650.0000 mg | ORAL_TABLET | ORAL | Status: DC | PRN

## 2016-04-11 MED ORDER — NITROGLYCERIN 0.4 MG SL SUBL: 0.4000 mg | SUBLINGUAL_TABLET | SUBLINGUAL | Status: DC | PRN

## 2016-04-11 MED ORDER — APIXABAN 5 MG PO TABS
5.0000 mg | ORAL_TABLET | Freq: Two times a day (BID) | ORAL | Status: DC
  Administered 2016-04-11 – 2016-04-14 (×5): 5 mg via ORAL
  Filled 2016-04-11 (×5): qty 1

## 2016-04-11 MED ORDER — RENA-VITE PO TABS
1.0000 | ORAL_TABLET | Freq: Every day | ORAL | Status: DC
  Administered 2016-04-13 – 2016-04-14 (×2): 1 via ORAL
  Filled 2016-04-11 (×2): qty 1

## 2016-04-11 MED ORDER — CLONIDINE HCL 0.1 MG PO TABS: 0.2000 mg | ORAL_TABLET | Freq: Once | ORAL | Status: DC

## 2016-04-11 MED ORDER — DIPHENHYDRAMINE HCL 50 MG/ML IJ SOLN
50.0000 mg | Freq: Once | INTRAMUSCULAR | Status: AC
  Administered 2016-04-11: 50 mg via INTRAVENOUS
  Filled 2016-04-11: qty 1

## 2016-04-11 MED ORDER — SERTRALINE HCL 100 MG PO TABS
100.0000 mg | ORAL_TABLET | Freq: Every day | ORAL | Status: DC
  Administered 2016-04-12 – 2016-04-14 (×3): 100 mg via ORAL
  Filled 2016-04-11 (×3): qty 1

## 2016-04-11 MED ORDER — HYDRALAZINE HCL 20 MG/ML IJ SOLN
20.0000 mg | Freq: Four times a day (QID) | INTRAMUSCULAR | Status: DC | PRN
  Administered 2016-04-13: 20 mg via INTRAVENOUS
  Filled 2016-04-11: qty 1

## 2016-04-11 MED ORDER — ISOSORBIDE DINITRATE 10 MG PO TABS
10.0000 mg | ORAL_TABLET | Freq: Three times a day (TID) | ORAL | Status: DC
  Administered 2016-04-11 – 2016-04-13 (×6): 10 mg via ORAL
  Filled 2016-04-11 (×6): qty 1

## 2016-04-11 MED ORDER — OXYCODONE-ACETAMINOPHEN 5-325 MG PO TABS
2.0000 | ORAL_TABLET | Freq: Once | ORAL | Status: AC
  Administered 2016-04-11: 2 via ORAL
  Filled 2016-04-11: qty 2

## 2016-04-11 MED ORDER — OXYCODONE HCL 5 MG PO TABS
5.0000 mg | ORAL_TABLET | Freq: Four times a day (QID) | ORAL | Status: DC | PRN
  Administered 2016-04-11 – 2016-04-13 (×5): 5 mg via ORAL
  Filled 2016-04-11 (×5): qty 1

## 2016-04-11 MED ORDER — LORAZEPAM 1 MG PO TABS
1.0000 mg | ORAL_TABLET | Freq: Every day | ORAL | Status: DC | PRN
  Administered 2016-04-12: 1 mg via ORAL
  Filled 2016-04-11: qty 1

## 2016-04-11 MED ORDER — HYDRALAZINE HCL 20 MG/ML IJ SOLN
10.0000 mg | Freq: Once | INTRAMUSCULAR | Status: DC
  Filled 2016-04-11: qty 1

## 2016-04-11 MED ORDER — HYDROMORPHONE HCL 1 MG/ML IJ SOLN
0.5000 mg | INTRAMUSCULAR | Status: DC | PRN
  Administered 2016-04-12 – 2016-04-14 (×12): 0.5 mg via INTRAVENOUS
  Filled 2016-04-11 (×12): qty 1

## 2016-04-11 NOTE — ED Notes (Signed)
Pt remains in xray.

## 2016-04-11 NOTE — Progress Notes (Signed)
Called CT scan to see if they were ready for her to go down for her test but was informed that she needed another 25 mg IV Benadryl, will try to get an order and call when given, B/P is also elevated, rechecked x 3 and manual was 180/112 will also address that, no other changes at this time.

## 2016-04-11 NOTE — Progress Notes (Signed)
Dr Montez Morita is in with the patient at this time. I let her know what was going on and that she received 10 mg IV Hydralazine for elevated B/P and that she was given IV Benadryl for allergic reaction to contrast dye and she had also received Solucortef and IV Benadryl earlier. Patient states that she gets red itchy hive-like bumps at the site but no SOB or tightness in her throat, will call CT to see if they will take her when the MD is finished, no other changes at this time.

## 2016-04-11 NOTE — ED Provider Notes (Signed)
CSN: [id number]     Arrival date & time 04/11/16  1053 History   First MD Initiated Contact with Patient 04/11/16 1111     Chief Complaint  Patient presents with  . Chest Pain  . Arm Pain     (Consider location/radiation/quality/duration/timing/severity/associated sxs/prior Treatment) HPI  25 year old female who presents with chest pain and left arm swelling. 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 She was admitted for left arm swelling, and diagnosed with DVT of the left IJ and left upper extremity, discharge from [facility] on [facility] 11. States that she has been compliant with Eliquis, but over the past 2 days swelling in her left arm has recurred. She has also developed pleuritic chest pain with mild shortness of breath. Pain seems improved with sitting up, not exacerbated by movement, and not associated with any fevers or cough. Also having vomiting with diarrhea intermittently. No abdominal pain. Nephrologist is at [facility].   Past Medical History  Diagnosis Date  . Hemodialysis patient (HCC)     Mon. Wed. Fri  . Hypertension   . Pulmonary emboli (HCC) 01/2012    Bilateral, moderate clot burden, areas of pulmonary infarction and central necrosis  . CHF (congestive heart failure) (HCC)   . Cardiomyopathy   . Dysrhythmia     at times per pt.  . Anemia   . H/O transfusion of packed red blood cells   . 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 Polycystic kidney disease   . Cellulitis and abscess of face 03/22/2013  . Renal insufficiency   . Chronic pain   . GERD (gastroesophageal reflux disease)   . Depression   . Hyperkalemia 09/2015  . Pelvic fracture (HCC)   . Osteoporosis   . Narcotic abuse, continuous   . 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 Anxiety   . Sickle cell anemia [facility])     Past Surgical History  Procedure Laterality Date  . Nephrectomy Right     third kidney placed in 2008, and body rejected in 2012   . Av fistula placement    . Kidney transplant  2008    failed  . Tonsillectomy      as a child.  . Adenoidectomy    . Incision and drainage abscess Right 03/21/2013    Procedure: INCISION AND DRAINAGE RIGHT CHEEK ABSCESS REMOVAL OF FOREIGN BODY;  Surgeon: Serena Colonel, MD;  Location: [facility] OR;  Service: ENT;  Laterality: Right;  . Implantable cardioverter defibrillator implant Right 12/2014  . Peripheral vascular catheterization Left 03/09/2016    Procedure: A/V Shuntogram/Fistulagram;  Surgeon: Annice Needy, MD;  Location: [facility] INVASIVE CV LAB;  Service: Cardiovascular;  Laterality: Left;  . Insertion of dialysis catheter Right Mar 09, 2016    thigh, Dr. Wyn Quaker, [facility]  . Cardiac catheterization    . Revison of arteriovenous fistula Left 03/22/2016    Procedure: REVISON OF ARTERIOVENOUS FISTULA ( ARTEGRAFT );  Surgeon: Annice Needy, MD;  Location: [facility] ORS;  Service: Vascular;  Laterality: Left;   Family History  Problem Relation Age of Onset  . Polycystic kidney disease Father   . Hypertension Father    Social History  Substance Use Topics  . Smoking status: Former Smoker -- 0.00 packs/day for 1 years    Types: Cigarettes  Quit date: 12/08/2015  . Smokeless tobacco: Never Used  . Alcohol Use: No   OB History    No data available     Review of Systems 10/14 systems reviewed and are negative other than those stated in the HPI    Allergies  Aspirin; Tramadol; Vicodin; Buprenorphine hcl; Iohexol; and Morphine and related  Home Medications   Prior to Admission medications   Medication Sig Start Date End Date Taking? Authorizing Provider  amLODipine (NORVASC) 5 MG tablet Take 1 tablet (5 mg total) by mouth daily. 03/21/16   Will Jorja Loa, MD  apixaban (ELIQUIS) 5 MG TABS tablet Take 2 tablets (10 mg total) by mouth 2 (two) times daily. Till  04/08/16 04/02/16 04/08/16  Enedina Finner, MD  apixaban (ELIQUIS) 5 MG TABS tablet Take 1 tablet (5 mg total) by mouth 2 (two) times daily. From 04/09/16 04/09/16   Enedina Finner, MD  calcium carbonate, dosed in mg elemental calcium, 1250 (500 Ca) MG/5ML Take 5 mLs (500 mg of elemental calcium total) by mouth every 6 (six) hours as needed for indigestion. 01/11/16   Rhetta Mura, MD  carvedilol (COREG) 6.25 MG tablet Take 12.5 mg by mouth daily.     Historical Provider, MD  cinacalcet (SENSIPAR) 30 MG tablet Take 1 tablet (30 mg total) by mouth daily with breakfast. Patient taking differently: Take 30 mg by mouth daily with supper.  03/13/16   Quentin Angst, MD  diclofenac sodium (VOLTAREN) 1 % GEL Apply 2 g topically 4 (four) times daily. 02/29/16   Quentin Angst, MD  hydrALAZINE (APRESOLINE) 25 MG tablet Take 1 tablet (25 mg total) by mouth 3 (three) times daily. 03/21/16   Will Jorja Loa, MD  isosorbide dinitrate (ISORDIL) 10 MG tablet Take 1 tablet (10 mg total) by mouth 3 (three) times daily. 02/01/16   Olugbemiga Annitta Needs, MD  LORazepam (ATIVAN) 1 MG tablet Take 1 tablet (1 mg total) by mouth daily as needed for anxiety. 04/06/16   Massie Maroon, FNP  multivitamin (RENA-VIT) TABS tablet Take 1 tablet by mouth daily. 01/04/16   Quentin Angst, MD  oxyCODONE (OXY IR/ROXICODONE) 5 MG immediate release tablet Take 1 tablet (5 mg total) by mouth every 6 (six) hours as needed for severe pain. 04/06/16   Massie Maroon, FNP  sertraline (ZOLOFT) 100 MG tablet Take 1 tablet (100 mg total) by mouth daily. 03/13/16   Quentin Angst, MD  sevelamer carbonate (RENVELA) 800 MG tablet Take 2-3 tablets (1,600-2,400 mg total) by mouth 3 (three) times daily with meals. 3 tabs with meals, 2 tabs with snacks 01/04/16 01/03/17  Olugbemiga E Hyman Hopes, MD   BP 165/118 mmHg  Pulse 113  Temp(Src) 98.6 F (37 C) (Oral)  Resp 17  Ht 5\' 3"  (1.6 m)  Wt 126 lb (57.153 kg)  BMI 22.33 kg/m2  SpO2 100%   LMP 04/12/2011 Physical Exam Physical Exam  Nursing note and vitals reviewed. Constitutional: Well developed, well nourished, non-toxic, and in no acute distress Head: Normocephalic and atraumatic.  Mouth/Throat: Oropharynx is clear and moist.  Neck: Normal range of motion. Neck supple.  Cardiovascular: Tachycardic rate and regular rhythm.  Mild edema of the LUE with +2 radial pulse, intact distal refill, in tact sensation and motor of radial, ulnar and median nerve. Pulmonary/Chest: Effort normal and breath sounds normal.  Abdominal: Soft. There is no tenderness. There is no rebound and no guarding.  Musculoskeletal: Normal range of motion.  Neurological: Alert, no facial  droop, fluent speech Skin: Skin is warm and dry.  Psychiatric: Cooperative  ED Course  Procedures (including critical care time) Labs Review Labs Reviewed  BASIC METABOLIC PANEL - Abnormal; Notable for the following:    Chloride 99 (*)    CO2 20 (*)    BUN 88 (*)    Creatinine, Ser 14.58 (*)    GFR calc non Af Amer 3 (*)    GFR calc Af Amer 4 (*)    Anion gap 18 (*)    All other components within normal limits  CBC - Abnormal; Notable for the following:    RBC 3.22 (*)    Hemoglobin 9.5 (*)    HCT 29.3 (*)    RDW 18.7 (*)    All other components within normal limits  TROPONIN I - Abnormal; Notable for the following:    Troponin I 0.54 (*)    All other components within normal limits  BRAIN NATRIURETIC PEPTIDE - Abnormal; Notable for the following:    B Natriuretic Peptide >4500.0 (*)    All other components within normal limits  HCG, QUANTITATIVE, PREGNANCY - Abnormal; Notable for the following:    hCG, Beta Chain, Quant, S 33 (*)    All other components within normal limits  TROPONIN I - Abnormal; Notable for the following:    Troponin I 0.53 (*)    All other components within normal limits    Imaging Review Dg Chest 2 View  04/11/2016  CLINICAL DATA:  Shortness of breath.  Chest pain for 2 days.  EXAM: CHEST  2 VIEW COMPARISON:  03/11/2016 FINDINGS: Stable moderate cardiomegaly. AICD noted. This is unchanged imposition. No edema. Lingular subsegmental atelectasis. No pleural effusion. IVC filter noted. IMPRESSION: 1. Moderate cardiomegaly, stable. 2. New lingular subsegmental atelectasis. Electronically Signed   By: Gaylyn Rong M.D.   On: 04/11/2016 12:05   US Venous Img Upper Uni Left  04/11/2016  CLINICAL DATA:  Patient was diagnosed with left internal jugular vein deep venous thrombosis on 04/01/2016, now presenting with worsening swelling and pain in the left arm. History of left brachiocephalic AV fistula, jump graft revision of left arm AV fistula and ligation of the aneurysmal fistula 03/22/2016. The patient is anticoagulated. EXAM: LEFT UPPER EXTREMITY VENOUS DOPPLER ULTRASOUND TECHNIQUE: Gray-scale sonography with graded compression, as well as color Doppler and duplex ultrasound were performed to evaluate the upper extremity deep venous system from the level of the subclavian vein and including the jugular, axillary, basilic, radial, ulnar and upper cephalic vein. Spectral Doppler was utilized to evaluate flow at rest and with distal augmentation maneuvers. COMPARISON:  None. FINDINGS: Contralateral Subclavian Vein: Respiratory phasicity is normal and symmetric with the symptomatic side. No evidence of thrombus. Normal compressibility. Internal Jugular Vein: There is an occlusive thrombus within long segment of the internal jugular vein, which demonstrates no compressibility. Subclavian Vein: No evidence of thrombus. Normal compressibility, respiratory phasicity and response to augmentation. Axillary Vein: No evidence of thrombus. Normal compressibility, respiratory phasicity and response to augmentation. Cephalic Vein: No evidence of thrombus. Normal compressibility, respiratory phasicity and response to augmentation. Basilic Vein: Not visualized. Brachial Veins: Limited visualization.  Radial Veins: No evidence of thrombus. Normal compressibility, respiratory phasicity and response to augmentation. Ulnar Veins: No evidence of thrombus. Normal compressibility, respiratory phasicity and response to augmentation. Venous Reflux:  None visualized. Other Findings: O occluded left av fistula, post ligation is seen in the upper arm. A mid upper arm jump graft is patent and measures 9  mm in cross-section. Left upper arm subcutaneous soft tissue edema is seen. IMPRESSION: Occlusive thrombus within long segment of the internal jugular vein to the level of the takeoff of the subclavian vein. Patent mid left upper arm jump graft measuring 9 mm in cross-section. Soft tissue edema. Electronically Signed   By: Ted Mcalpine M.D.   On: 04/11/2016 13:36   I have personally reviewed and evaluated these images and lab results as part of my medical decision-making.   EKG Interpretation   Date/Time:  Tuesday April 11 2016 11:11:03 EDT Ventricular Rate:  108 PR Interval:  260 QRS Duration: 82 QT Interval:  300 QTC Calculation: 402 R Axis:   16 Text Interpretation:  Sinus tachycardia with 1st degree A-V block Possible  Left atrial enlargement ST \\T \ T wave abnormality, consider inferolateral  ischemia Abnormal ECG Inferolateral ST changes are seen on prior EKG. No  acute ischemic changes.  Confirmed by Danyel Tobey MD, Annabelle Harman [phone]) on 04/11/2016  11:21:44 AM      MDM   Final diagnoses:  Pericarditis  DVT (deep venous thrombosis), left  Chest pain, unspecified chest pain type  Hypertensive urgency   Suspect possible uremic pericarditis as etiology of pleuritic chest pain. Had inadequate dialysis yesterday, persistent uremia and her bedside ultrasound with small pericardial effusion. She has had similar presentation in the past. Discussed with nephrology, who did not think she required emergent dialysis. Also c/f PE as she was recently diagnosed with LUE DVT. Repeat US of her LUE shows persistent  occlusive thrombosis which seems stable. Pending CT PE. Also with elevated troponin, which is likely in setting of inadequate dialysis however risk factors high for other cardiac causes and with persistent elevated BP. EKG unchanged from baseline.  Discussed with Dr. Konrad Dolores from [facility], who will admit to observation for dialysis and cardiac monitoring.    Lavera Guise, MD 04/11/16 [id number]

## 2016-04-11 NOTE — ED Notes (Signed)
Cannot get vitals due to pt still in xray

## 2016-04-11 NOTE — Progress Notes (Signed)
Text paged [facility] re pain meds, pain rated at 8/10 in her chest that gets better when sitting forward, trying to get someone to see this patient.

## 2016-04-11 NOTE — ED Notes (Signed)
Attempted report to floor x 1, RN is busy and will call back.

## 2016-04-11 NOTE — Progress Notes (Signed)
Spoke with the charge nurse since patient has not been seen by an attending MD and told her who I have been getting orders from, will need to page the admitting MD and have them come see the patient. I called CT to let them know that her Benadryl 25 mg IV was given and that she will be ready to go within the hour.

## 2016-04-11 NOTE — Progress Notes (Signed)
Patient's evening meds that were ordered were given and CT was called again and was informed that her reaction to the contrast dye was localized, and after speaking with the MD, she was told that the patient could still go and that she could go without an Charity fundraiser but on the monitor. Patient's B/P at this time is 152/94 and she is c/o pain in her chest again, will give her some Oxy 5mg  IR since her IV Dilaudid isn't due and send her for her CT Angio of her chest and re-evaluate the pain when she returns. Pt is stable at this time and may go to CT scan in a wheelchair on telemetry.

## 2016-04-11 NOTE — Progress Notes (Signed)
Patient coming from [facility] to stepdown bed. Patient presenting with chest pain and hypertensive emergency with pressures as high as 185/128. Patient with a history of medical noncompliance especially with her ESR D/dialysis. Last dialysis was one day ago the patient went to 1 out of 4 hours of dialysis. Note patient recently discharged from [facility] after treatment for left IJ DVT. Patient started on liquids. Repeat ultrasound at this point time shows stable occlusive thrombus. Patient to be transferred to stepdown bed at [facility] for observation admission. Anticipate dialysis will be necessary on 04/12/2016. We will need to consult nephrology  Linna Darner, MD [facility] 04/11/2016, 3:57 PM

## 2016-04-11 NOTE — ED Notes (Signed)
Patient transported to Ultrasound 

## 2016-04-11 NOTE — ED Notes (Signed)
Pt post op left arm fistula revision.  Pt states she started to have chest pain two days ago with left arm pain, swelling , sob and diaphoresis.  Unknown fever.  Pt called her surgeon who referred her to ED

## 2016-04-11 NOTE — H&P (Signed)
History and Physical    [name] Small ZOX:[id number] DOB: [birth year] DOA: 04/11/2016  PCP: Jeanann Lewandowsky, MD   Patient coming from: [facility]  Chief Complaint: Pleuritic chest pain  HPI: [name] Small is a 25 y.o. 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  She presented to the ED tonight for evaluation of left sided pleuritic chest pain.  The pain occurs at rest.  It was constant and stabbing in nature earlier today, which prompted presentation for further evaluation.  She says that she typically has recurrent chest pain on the right side of her chest, but no on the left.  This pain has not been associated with shortness of breath or syncope, but she has had light-headedness.  No known history of MI or CAD.    She has not had cough, fever, or sputum production associated with this chest pain.  She reports nausea, vomiting, and diarrhea for two days.  She received IV antibiotics during her admission to [place] when there was a concern for LUE cellulitis, but she was not discharged on oral antibiotics.  She also reports drainage of clear fluid from the anterior aspect of her left arm.  ED Course: The patient has a mildly elevated beta HCG, which has been present for the past three months.  She has an elevated troponin, which is slightly higher than her baseline.  Her anemia appears stable.  EKG changes are noted (sinus tachycardia, 1st degree AV block, minimal ST depressions with T wave inversion in V5,V6), but these changes have been present on prior EKGs.  LUE ultrasound shows persistent, extensive DVT.  Chest xray shows stable cardiomegaly and atelectasis.  Accepted for  admission to SDU at [facility] for further evaluation and treatment.    CTA chest is pending; she is being pre-treated with benadryl and Solu-cortef per protocol.  She tells me that her reaction to contrast in the past was arm redness and itching; she denies face, tongue, throat swelling or shortness of breath.  She tells me that she has received IV contrast since her initial reaction, and tolerates it fine as long as she is pre-treated.  Rationale for CTA chest explained, and the patient is agreeable to proceed with the study as ordered.  Review of Systems: As per HPI otherwise 10 point review of systems negative.    Past Medical History  Diagnosis Date  . Hemodialysis patient (HCC)     Mon. Wed. Fri  . Hypertension   . Pulmonary emboli (HCC) 01/2012    Bilateral, moderate clot burden, areas of pulmonary infarction and central necrosis  . CHF (congestive heart failure) (HCC)   . Cardiomyopathy   . Dysrhythmia     at times per pt.  . Anemia   . H/O transfusion of packed red blood cells   . Cellulitis and abscess of face 03/22/2013  . Chronic pain   . GERD (gastroesophageal reflux disease)   . Depression   . Hyperkalemia 09/2015  . Pelvic fracture (HCC) 06/2015    "on the right"  . Osteoporosis   . Narcotic abuse, continuous   . 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 Anxiety   . Sickle cell anemia (HCC)   .  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 Polycystic kidney disease   . Renal insufficiency   . ESRD (end stage renal disease) on dialysis [facility])     "MWF; [facility]" (04/11/2016)    Past Surgical History  Procedure Laterality Date  . Nephrectomy Right     third kidney placed in 2008, and body rejected in 2012   . Av fistula placement Bilateral     "right side stopped working & never had it revised" (04/11/2016)    . Kidney transplant  2008    failed  . Incision and drainage abscess Right 03/21/2013    Procedure: INCISION AND DRAINAGE RIGHT CHEEK ABSCESS REMOVAL OF FOREIGN BODY;  Surgeon: Serena Colonel, MD;  Location: [facility] OR;  Service: ENT;  Laterality: Right;  . Implantable cardioverter defibrillator implant Right 12/2014  . Peripheral vascular catheterization Left 03/09/2016    Procedure: A/V Shuntogram/Fistulagram;  Surgeon: Annice Needy, MD;  Location: [facility] INVASIVE CV LAB;  Service: Cardiovascular;  Laterality: Left;  . Insertion of dialysis catheter Right Mar 09, 2016    thigh, Dr. Wyn Quaker, [facility]  . Cardiac catheterization    . Revison of arteriovenous fistula Left 03/22/2016    Procedure: REVISON OF ARTERIOVENOUS FISTULA ( ARTEGRAFT );  Surgeon: Annice Needy, MD;  Location: [facility] ORS;  Service: Vascular;  Laterality: Left;  . Tonsillectomy and adenoidectomy  ~ 2000     reports that she quit smoking about 4 months ago. Her smoking use included Cigarettes. She smoked 0.00 packs per day for 1 year. She has never used smokeless tobacco. She reports that she does not drink alcohol or use illicit drugs. She is not married.  She does not have any children.  Lives independently.  She receives disability.  Her parents live in [place].  Allergies  Allergen Reactions  . Aspirin Other (See Comments)    Interacts with Coreg  . Tramadol Anaphylaxis  . Vicodin [Hydrocodone-Acetaminophen] Hives  . Buprenorphine Hcl Itching and Other (See Comments)    Ok with oxycodone  . Iohexol Itching  . Morphine And Related Itching and Other (See Comments)    Ok with oxycodone    Family History  Problem Relation Age of Onset  . Polycystic kidney disease Father   . Hypertension Father      Prior to Admission medications   Medication Sig Start Date End Date Taking? Authorizing Provider  amLODipine (NORVASC) 5 MG tablet Take 1 tablet (5 mg total) by mouth daily. 03/21/16  Yes Will Jorja Loa, MD  apixaban (ELIQUIS) 5  MG TABS tablet Take 2 tablets (10 mg total) by mouth 2 (two) times daily. Till 04/08/16 04/02/16 04/11/16 Yes Enedina Finner, MD  carvedilol (COREG) 6.25 MG tablet Take 6.25 mg by mouth 2 (two) times daily with a meal.    Yes Historical Provider, MD  cinacalcet (SENSIPAR) 30 MG tablet Take 1 tablet (30 mg total) by mouth daily with breakfast. Patient taking differently: Take 30 mg by mouth daily with supper.  03/13/16  Yes Quentin Angst, MD  diclofenac sodium (VOLTAREN) 1 % GEL Apply 2 g topically 4 (four) times daily. 02/29/16  Yes Quentin Angst, MD  hydrALAZINE (APRESOLINE) 25 MG tablet Take 1 tablet (25 mg total) by mouth 3 (three) times daily. 03/21/16  Yes Will Jorja Loa, MD  isosorbide dinitrate (ISORDIL) 10 MG tablet Take 1 tablet (10 mg total) by mouth  3 (three) times daily. 02/01/16  Yes Olugbemiga Annitta Needs, MD  LORazepam (ATIVAN) 1 MG tablet Take 1 tablet (1 mg total) by mouth daily as needed for anxiety. 04/06/16  Yes Massie Maroon, FNP  multivitamin (RENA-VIT) TABS tablet Take 1 tablet by mouth daily. 01/04/16  Yes Quentin Angst, MD  oxyCODONE (OXY IR/ROXICODONE) 5 MG immediate release tablet Take 1 tablet (5 mg total) by mouth every 6 (six) hours as needed for severe pain. 04/06/16  Yes Massie Maroon, FNP  sertraline (ZOLOFT) 100 MG tablet Take 1 tablet (100 mg total) by mouth daily. 03/13/16  Yes Quentin Angst, MD  sevelamer carbonate (RENVELA) 800 MG tablet Take 2-3 tablets (1,600-2,400 mg total) by mouth 3 (three) times daily with meals. 3 tabs with meals, 2 tabs with snacks 01/04/16 01/03/17 Yes Olugbemiga Annitta Needs, MD  apixaban (ELIQUIS) 5 MG TABS tablet Take 1 tablet (5 mg total) by mouth 2 (two) times daily. From 04/09/16 Patient not taking: Reported on 04/11/2016 04/09/16   Enedina Finner, MD    Physical Exam: Filed Vitals:   04/11/16 1835 04/11/16 1838 04/11/16 1956 04/11/16 2100  BP: 163/52 161/96 135/115 181/113  Pulse: 98  102 109  Temp: 98.1 F (36.7 C)  98.4  F (36.9 C)   TempSrc: Oral  Oral   Resp: 23  19   Height:      Weight:  58.605 kg (129 lb 3.2 oz)    SpO2: 100%  100%       Constitutional: NAD, calm, somewhat ill appearing but certainly not decompensating at this point  Filed Vitals:   04/11/16 1835 04/11/16 1838 04/11/16 1956 04/11/16 2100  BP: 163/52 161/96 135/115 181/113  Pulse: 98  102 109  Temp: 98.1 F (36.7 C)  98.4 F (36.9 C)   TempSrc: Oral  Oral   Resp: 23  19   Height:      Weight:  58.605 kg (129 lb 3.2 oz)    SpO2: 100%  100%    Eyes: PERRL, lids and conjunctivae normal ENMT: Mucous membranes are moist. Posterior pharynx clear of any exudate or lesions. Normal dentition.  Neck: normal appearance, supple Respiratory: clear to auscultation bilaterally, no wheezing, no crackles. Normal respiratory effort. No accessory muscle use.  Cardiovascular: Tachycardic but regular.  I did not hear a murmur or rub.  She has 1+ pitting edema in bilateral lower extremities.  2+ pedal pulses.  GI: abdomen is soft and compressible.  No distention.  No tenderness.  No masses palpated.  Bowel sounds are present. Musculoskeletal:  OLD LUE fistula noted, no wound dehiscence.  Left arm obviously swollen compared to right.  Otherwise, good ROM, no contractures. Normal muscle tone.  Skin: no rashes, warm and dry Neurologic: No focal deficits Psychiatric: Normal judgment and insight. Alert and oriented x 3. Normal mood but somewhat flat affect.    Labs on Admission: I have personally reviewed following labs and imaging studies  CBC:  Recent Labs Lab 04/11/16 1130  WBC 5.9  HGB 9.5*  HCT 29.3*  MCV 91.0  PLT 264   Basic Metabolic Panel:  Recent Labs Lab 04/11/16 1130  NA 137  K 4.2  CL 99*  CO2 20*  GLUCOSE 82  BUN 88*  CREATININE 14.58*  CALCIUM 9.6   GFR: Estimated Creatinine Clearance: 4.9 mL/min (by C-G formula based on Cr of 14.58).  Cardiac Enzymes:  Recent Labs Lab 04/11/16 1130 04/11/16 1450   TROPONINI 0.54* 0.53*  Radiological Exams on Admission: Dg Chest 2 View  04/11/2016  CLINICAL DATA:  Shortness of breath.  Chest pain for 2 days. EXAM: CHEST  2 VIEW COMPARISON:  03/11/2016 FINDINGS: Stable moderate cardiomegaly. AICD noted. This is unchanged imposition. No edema. Lingular subsegmental atelectasis. No pleural effusion. IVC filter noted. IMPRESSION: 1. Moderate cardiomegaly, stable. 2. New lingular subsegmental atelectasis. Electronically Signed   By: Gaylyn Rong M.D.   On: 04/11/2016 12:05   US Venous Img Upper Uni Left  04/11/2016  CLINICAL DATA:  Patient was diagnosed with left internal jugular vein deep venous thrombosis on 04/01/2016, now presenting with worsening swelling and pain in the left arm. History of left brachiocephalic AV fistula, jump graft revision of left arm AV fistula and ligation of the aneurysmal fistula 03/22/2016. The patient is anticoagulated. EXAM: LEFT UPPER EXTREMITY VENOUS DOPPLER ULTRASOUND TECHNIQUE: Gray-scale sonography with graded compression, as well as color Doppler and duplex ultrasound were performed to evaluate the upper extremity deep venous system from the level of the subclavian vein and including the jugular, axillary, basilic, radial, ulnar and upper cephalic vein. Spectral Doppler was utilized to evaluate flow at rest and with distal augmentation maneuvers. COMPARISON:  None. FINDINGS: Contralateral Subclavian Vein: Respiratory phasicity is normal and symmetric with the symptomatic side. No evidence of thrombus. Normal compressibility. Internal Jugular Vein: There is an occlusive thrombus within long segment of the internal jugular vein, which demonstrates no compressibility. Subclavian Vein: No evidence of thrombus. Normal compressibility, respiratory phasicity and response to augmentation. Axillary Vein: No evidence of thrombus. Normal compressibility, respiratory phasicity and response to augmentation. Cephalic Vein: No evidence of  thrombus. Normal compressibility, respiratory phasicity and response to augmentation. Basilic Vein: Not visualized. Brachial Veins: Limited visualization. Radial Veins: No evidence of thrombus. Normal compressibility, respiratory phasicity and response to augmentation. Ulnar Veins: No evidence of thrombus. Normal compressibility, respiratory phasicity and response to augmentation. Venous Reflux:  None visualized. Other Findings: O occluded left av fistula, post ligation is seen in the upper arm. A mid upper arm jump graft is patent and measures 9 mm in cross-section. Left upper arm subcutaneous soft tissue edema is seen. IMPRESSION: Occlusive thrombus within long segment of the internal jugular vein to the level of the takeoff of the subclavian vein. Patent mid left upper arm jump graft measuring 9 mm in cross-section. Soft tissue edema. Electronically Signed   By: Ted Mcalpine M.D.   On: 04/11/2016 13:36    EKG: Independently reviewed. Findings as outlined above.  Assessment/Plan Principal Problem:   Chest pain Active Problems:   ESRD (end stage renal disease) on dialysis (HCC)   Systolic CHF, chronic (HCC)   Anemia of chronic renal failure, stage 5 (HCC)   Elevated serum hCG   AICD (automatic cardioverter/defibrillator) present   Failed kidney transplant   Hypertensive urgency   DVT (deep venous thrombosis), left      Pleuritic chest pain with abnormal troponin (though in the context of ESRD) with accelerated HTN and recent diagnosis of LUE DVT.  Differential certainly included PE (althoug the patient reports that she has been compliant with Eliquis as prescribed at discharge from [place] earlier this month), pericarditis, and MI. --CTA chest pending.  Anticipate continuing anticoagulation with Eliquis as long as there are no acute findings.  Low threshold for converting to a heparin infusion for now. --Pain improved after IV diluadid, analgesics as needed.  Has also received IV  steroids as pre-treatment for contrast exposure.  Consider starting oral prednisone in the  AM for possible pericarditis, if other etiologies for chest pain are ruled out --Serial troponin, repeat EKG in the AM --Complete echo in the AM --Incentive spirometer use --Aspirin not ordered due to reported allergy  LUE DVT, site of prior dialysis graft, patient now reporting spontaneous drainage from her left arm but there is no obvious wound dehiscence --The patient was seen by vascular surgery at [place].  It does not appear that she was considered to be a candidate for thrombolytics at that time.  Consider vascular surgery consult here; it was noted that she will need a fistulogram at some point.  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  Not sure if Eliquis will be the best drug for this given her ESRD.  Consider hematology consult.  DVT prophylaxis: FULL anticoagulation Code Status: FULL Family Communication: Patient alone at time of admission. Disposition Plan: To be determined. Consults called: It appears that nephrology was called from the ED in [facility], but they will need to be called again in the morning to make sure she gets HD orders. Admission status: Inpatient,  stepdown unit  TIME SPENT: 70 minutes   Jerene Bears MD [facility] Pager [phone]  If 7PM-7AM, please contact night-coverage [email or web address] Password [email or web address]  04/11/2016, 11:01 PM

## 2016-04-12 ENCOUNTER — Other Ambulatory Visit: Payer: Self-pay

## 2016-04-12 ENCOUNTER — Inpatient Hospital Stay (HOSPITAL_COMMUNITY): Payer: Medicare Other

## 2016-04-12 ENCOUNTER — Encounter (HOSPITAL_COMMUNITY): Payer: Self-pay | Admitting: Family Medicine

## 2016-04-12 DIAGNOSIS — Z95810 Presence of automatic (implantable) cardiac defibrillator: Secondary | ICD-10-CM

## 2016-04-12 DIAGNOSIS — R0781 Pleurodynia: Secondary | ICD-10-CM

## 2016-04-12 DIAGNOSIS — I5022 Chronic systolic (congestive) heart failure: Secondary | ICD-10-CM

## 2016-04-12 DIAGNOSIS — I160 Hypertensive urgency: Secondary | ICD-10-CM

## 2016-04-12 DIAGNOSIS — E349 Endocrine disorder, unspecified: Secondary | ICD-10-CM

## 2016-04-12 DIAGNOSIS — Z7901 Long term (current) use of anticoagulants: Secondary | ICD-10-CM

## 2016-04-12 DIAGNOSIS — R079 Chest pain, unspecified: Secondary | ICD-10-CM

## 2016-04-12 DIAGNOSIS — N186 End stage renal disease: Secondary | ICD-10-CM

## 2016-04-12 DIAGNOSIS — T8612 Kidney transplant failure: Secondary | ICD-10-CM

## 2016-04-12 DIAGNOSIS — I82402 Acute embolism and thrombosis of unspecified deep veins of left lower extremity: Secondary | ICD-10-CM

## 2016-04-12 DIAGNOSIS — Z992 Dependence on renal dialysis: Secondary | ICD-10-CM

## 2016-04-12 LAB — HEPATIC FUNCTION PANEL
ALBUMIN: 2.9 g/dL — AB (ref 3.5–5.0)
ALK PHOS: 272 U/L — AB (ref 38–126)
ALT: 5 U/L — AB (ref 14–54)
AST: 24 U/L (ref 15–41)
BILIRUBIN INDIRECT: 0.6 mg/dL (ref 0.3–0.9)
Bilirubin, Direct: 0.2 mg/dL (ref 0.1–0.5)
TOTAL PROTEIN: 7.3 g/dL (ref 6.5–8.1)
Total Bilirubin: 0.8 mg/dL (ref 0.3–1.2)

## 2016-04-12 LAB — ECHOCARDIOGRAM COMPLETE
E decel time: 137 msec
EERAT: 13.92
FS: 10 % — AB (ref 28–44)
HEIGHTINCHES: 63 in
IVS/LV PW RATIO, ED: 0.75
LA ID, A-P, ES: 44 mm
LA vol A4C: 67.3 ml
LA vol index: 56.8 mL/m2
LA vol: 92 mL
LADIAMINDEX: 2.72 cm/m2
LDCA: 2.84 cm2
LEFT ATRIUM END SYS DIAM: 44 mm
LV E/e' medial: 13.92
LV E/e'average: 13.92
LV PW d: 14.1 mm — AB (ref 0.6–1.1)
LV e' LATERAL: 9.34 cm/s
LVOTD: 19 mm
MV Dec: 137
MV Peak grad: 7 mmHg
MV pk E vel: 130 m/s
RV TAPSE: 18.3 mm
TDI e' lateral: 9.34
TDI e' medial: 4.05
VTI: 126 cm
Weight: 2118.4 oz

## 2016-04-12 LAB — BASIC METABOLIC PANEL
Anion gap: 19 — ABNORMAL HIGH (ref 5–15)
BUN: 94 mg/dL — AB (ref 6–20)
CALCIUM: 8.7 mg/dL — AB (ref 8.9–10.3)
CO2: 20 mmol/L — ABNORMAL LOW (ref 22–32)
CREATININE: 15.36 mg/dL — AB (ref 0.44–1.00)
Chloride: 98 mmol/L — ABNORMAL LOW (ref 101–111)
GFR calc Af Amer: 3 mL/min — ABNORMAL LOW (ref >60)
GFR, EST NON AFRICAN AMERICAN: 3 mL/min — AB (ref >60)
Glucose, Bld: 148 mg/dL — ABNORMAL HIGH (ref 65–99)
Potassium: 4.7 mmol/L (ref 3.5–5.1)
SODIUM: 137 mmol/L (ref 135–145)

## 2016-04-12 LAB — CBC
HCT: 28.8 % — ABNORMAL LOW (ref 36.0–46.0)
Hemoglobin: 9 g/dL — ABNORMAL LOW (ref 12.0–15.0)
MCH: 27.7 pg (ref 26.0–34.0)
MCHC: 31.3 g/dL (ref 30.0–36.0)
MCV: 88.6 fL (ref 78.0–100.0)
PLATELETS: 249 10*3/uL (ref 150–400)
RBC: 3.25 MIL/uL — ABNORMAL LOW (ref 3.87–5.11)
RDW: 18.9 % — AB (ref 11.5–15.5)
WBC: 4.4 10*3/uL (ref 4.0–10.5)

## 2016-04-12 LAB — TROPONIN I
TROPONIN I: 0.46 ng/mL — AB (ref <0.031)
TROPONIN I: 0.39 ng/mL — AB (ref <0.031)
Troponin I: 0.33 ng/mL — ABNORMAL HIGH (ref <0.031)

## 2016-04-12 LAB — PROTIME-INR
INR: 1.55 — ABNORMAL HIGH (ref 0.00–1.49)
Prothrombin Time: 18.7 seconds — ABNORMAL HIGH (ref 11.6–15.2)

## 2016-04-12 LAB — APTT: aPTT: 42 seconds — ABNORMAL HIGH (ref 24–37)

## 2016-04-12 MED ORDER — ALTEPLASE 2 MG IJ SOLR
2.0000 mg | Freq: Once | INTRAMUSCULAR | Status: AC | PRN
  Administered 2016-04-12: 2 mg

## 2016-04-12 MED ORDER — SODIUM CHLORIDE 0.9 % IV SOLN: 100.0000 mL | INTRAVENOUS | Status: DC | PRN

## 2016-04-12 MED ORDER — OXYCODONE-ACETAMINOPHEN 5-325 MG PO TABS
1.0000 | ORAL_TABLET | ORAL | Status: DC | PRN
  Administered 2016-04-13: 2 via ORAL
  Filled 2016-04-12: qty 2

## 2016-04-12 MED ORDER — ALTEPLASE 2 MG IJ SOLR
INTRAMUSCULAR | Status: AC
  Filled 2016-04-12: qty 2

## 2016-04-12 MED ORDER — NICOTINE 14 MG/24HR TD PT24
14.0000 mg | MEDICATED_PATCH | Freq: Every day | TRANSDERMAL | Status: DC
  Filled 2016-04-12: qty 1

## 2016-04-12 MED ORDER — LIDOCAINE HCL (PF) 1 % IJ SOLN: 5.0000 mL | INTRAMUSCULAR | Status: DC | PRN

## 2016-04-12 MED ORDER — HEPARIN SODIUM (PORCINE) 1000 UNIT/ML DIALYSIS
1000.0000 [IU] | INTRAMUSCULAR | Status: DC | PRN
  Filled 2016-04-12: qty 1

## 2016-04-12 MED ORDER — ALTEPLASE 2 MG IJ SOLR: 2.0000 mg | Freq: Once | INTRAMUSCULAR | Status: DC

## 2016-04-12 MED ORDER — ALTEPLASE 2 MG IJ SOLR
INTRAMUSCULAR | Status: AC
  Filled 2016-04-12: qty 4

## 2016-04-12 MED ORDER — IOPAMIDOL (ISOVUE-370) INJECTION 76%
80.0000 mL | Freq: Once | INTRAVENOUS | Status: AC | PRN
  Administered 2016-04-12: 80 mL via INTRAVENOUS

## 2016-04-12 MED ORDER — HYDROMORPHONE HCL 1 MG/ML IJ SOLN
INTRAMUSCULAR | Status: AC
  Filled 2016-04-12: qty 1

## 2016-04-12 MED ORDER — LIDOCAINE-PRILOCAINE 2.5-2.5 % EX CREA
1.0000 "application " | TOPICAL_CREAM | CUTANEOUS | Status: DC | PRN
  Filled 2016-04-12: qty 5

## 2016-04-12 MED ORDER — PENTAFLUOROPROP-TETRAFLUOROETH EX AERO: 1.0000 "application " | INHALATION_SPRAY | CUTANEOUS | Status: DC | PRN

## 2016-04-12 MED ORDER — HEPARIN SODIUM (PORCINE) 1000 UNIT/ML DIALYSIS
20.0000 [IU]/kg | INTRAMUSCULAR | Status: DC | PRN
  Filled 2016-04-12: qty 2

## 2016-04-12 NOTE — Procedures (Signed)
Hemodialysis via right groin catheter complicated by catheter thrombosis that was treated with TPA and treatment resumed. No hemodynamic issues at this time. BFR 350cc/min. [name] Small

## 2016-04-12 NOTE — Progress Notes (Signed)
Dr Allena Katz ( Cardiology fellow) text paged and was notified of the EKG and he called back and I told him that I was going to give her Oxy IR and 1mg  of Ativan po to help her to rest for the night and he was in agreement. No significant changes on her EKG and her Troponins are trending down, will continue to monitor.

## 2016-04-12 NOTE — Progress Notes (Signed)
04/12/2016 10:09 AM  Pt caught smoking in room.  Pt advised that she cannot do this in the hospital. Will order nicotine patch.  Maryln Manuel, MD

## 2016-04-12 NOTE — Progress Notes (Signed)
PROGRESS NOTE    [name] Small  VFI:[id number]  DOB: 1990-11-09  DOA: 04/11/2016 PCP: Jeanann Lewandowsky, MD Outpatient Specialists:   Hospital course: [name] Small is a 25 y.o. 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 She presented to the ED tonight for evaluation of left sided pleuritic chest pain. The pain occurs at rest. It was constant and stabbing in nature earlier today, which prompted presentation for further evaluation. She says that she typically has recurrent chest pain on the right side of her chest, but no on the left. This pain has not been associated with shortness of breath or syncope, but she has had light-headedness. No known history of MI or CAD. CTA chest negative for PE.  She does have a history of PEs in the past.    Assessment & Plan:   Pleuritic chest pain with abnormal troponin (though in the context of ESRD) with accelerated HTN and recent diagnosis of LUE DVT. Differential certainly included PE (althoug the patient reports that she has been compliant with Eliquis as prescribed at discharge from [place] earlier this month), pericarditis, and MI. --CTA chest negative for PE. Plan to continue anticoagulation with Eliquis.  --Pain improved after IV diluadid, analgesics as needed. --Serial troponin, repeat EKG in the AM --Complete echo pending to evaluate for pericarditis --Incentive spirometer use encouraged --Aspirin not ordered due to reported allergy  LUE DVT - fairly recent, site of prior dialysis graft, patient now reporting spontaneous drainage from her left arm but there is no obvious wound dehiscence --The patient was evaluated by vascular  surgery at [place]. It does not appear that she was considered to be a candidate for thrombolytics at that time.  ESRD on HD, she now has a right femoral permacath that has reportedly been in for approximately one month --Requesting nephrology consult for HD, I called and notifed service for HD this morning 6/21.   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 Not sure if Eliquis will be the best drug for this given her ESRD.  DVT prophylaxis: FULL anticoagulation Code Status: FULL Family Communication: Patient alone at time of admission. Disposition Plan: To be determined. Consults called: It appears that nephrology was called from the ED in [facility], but they will need to be called again in the morning to make sure she gets HD orders. Admission status: Inpatient, stepdown unit  Consultants:  Nephrology  Subjective: Pt says that she is feeling better this morning.  Not complaining of shortness of breath or chest pain.  Unchanged edema left arm.  Says she hadn't missed any doses of eliquis.   Objective: Filed Vitals:   04/11/16 2100 04/11/16 2313 04/11/16 2325 04/12/16 0506  BP: 181/113 154/102 154/96 134/86  Pulse: 109  101 88  Temp:   98.3 F (36.8 C) 97.9 F (36.6 C)  TempSrc:   Oral Oral  Resp:   22 10  Height:      Weight:    132 lb 6.4 oz (60.056 kg)  SpO2:   100% 100%     Intake/Output Summary (Last 24 hours) at 04/12/16 0739 Last data filed at 04/12/16 0100  Gross per 24 hour  Intake    486 ml  Output      0 ml  Net    486 ml   Filed Weights   04/11/16 1104 04/11/16 1838 04/12/16 0506  Weight: 126 lb (57.153 kg) 129 lb 3.2 oz (58.605 kg) 132 lb 6.4 oz (60.056 kg)    Exam:  Eyes: PERRL, lids and conjunctivae normal, no scleral icterus. ENMT: Mucous membranes are moist. Posterior pharynx clear of any exudate or lesions. Normal dentition.  Neck: normal appearance, supple Respiratory: clear to auscultation bilaterally, no wheezing, no crackles. Normal respiratory effort. No accessory muscle use.  Cardiovascular: Tachycardic but regular. I did not hear a murmur or rub. She has 1+ pitting edema in bilateral lower extremities. 2+ pedal pulses.  GI: abdomen is soft and compressible. No distention. No tenderness. No masses palpated. Bowel sounds are present. Musculoskeletal: OLD LUE fistula noted, no wound dehiscence. Left arm obviously swollen compared to right. Otherwise, good ROM, no contractures. Normal muscle tone.  Skin: no rashes, warm and dry Neurologic: No focal deficits Psychiatric: Normal judgment and insight. Alert and oriented x 3. Normal mood but somewhat flat affect.  Data Reviewed: Basic Metabolic Panel:  Recent Labs Lab 04/11/16 1130 04/12/16 0445  NA 137 137  K 4.2 4.7  CL 99* 98*  CO2 20* 20*  GLUCOSE 82 148*  BUN 88* 94*  CREATININE 14.58* 15.36*  CALCIUM 9.6 8.7*   Liver Function Tests:  Recent Labs Lab 04/12/16 0445  AST 24  ALT 5*  ALKPHOS 272*  BILITOT 0.8  PROT 7.3  ALBUMIN 2.9*   No results for input(s): LIPASE, AMYLASE in the last 168 hours. No results for input(s): AMMONIA in the last 168 hours. CBC:  Recent Labs Lab 04/11/16 1130 04/12/16 0445  WBC 5.9 4.4  HGB 9.5* 9.0*  HCT 29.3* 28.8*  MCV 91.0 88.6  PLT 264 249   Cardiac Enzymes:  Recent Labs Lab 04/11/16 1130  04/11/16 1450 04/11/16 2308 04/12/16 0445  TROPONINI 0.54* 0.53* 0.46* 0.39*   BNP (last 3 results) No results for input(s): PROBNP in the last 8760 hours. CBG: No results for input(s): GLUCAP in the last 168 hours.  Recent Results (from the past 240 hour(s))  MRSA PCR Screening     Status: None   Collection Time: 04/11/16  6:27 PM  Result Value Ref Range Status   MRSA by PCR NEGATIVE NEGATIVE Final    Comment:        The GeneXpert MRSA Assay (FDA approved for NASAL specimens only), is one component of a comprehensive MRSA colonization surveillance program. It is not intended to diagnose MRSA infection nor to guide or monitor treatment for MRSA infections.     Studies: Dg Chest 2 View  04/11/2016  CLINICAL DATA:  Shortness of breath.  Chest pain for 2 days. EXAM: CHEST  2 VIEW COMPARISON:  03/11/2016 FINDINGS: Stable moderate cardiomegaly. AICD noted. This is unchanged imposition. No edema. Lingular subsegmental atelectasis. No pleural effusion. IVC filter noted. IMPRESSION: 1. Moderate cardiomegaly, stable. 2. New lingular subsegmental atelectasis. Electronically Signed   By: Zollie Beckers  Ova Freshwater M.D.   On: 04/11/2016 12:05   Ct Angio Chest Pe W/cm &/or Wo Cm  04/12/2016  CLINICAL DATA:  Acute onset of pleuritic chest pain. Recently diagnosed with deep venous thrombosis at the left upper extremity. Initial encounter. EXAM: CT ANGIOGRAPHY CHEST WITH CONTRAST TECHNIQUE: Multidetector CT imaging of the chest was performed using the standard protocol during bolus administration of intravenous contrast. Multiplanar CT image reconstructions and MIPs were obtained to evaluate the vascular anatomy. CONTRAST:  80 mL of Isovue 370 IV contrast COMPARISON:  Chest radiograph performed 04/11/2016 FINDINGS: There is no evidence of pulmonary embolus. A trace right-sided pleural effusion is noted, with mild bibasilar atelectasis. The lungs are otherwise grossly clear. There is no evidence of  pneumothorax. No masses are identified; no abnormal focal contrast enhancement is seen. The heart is significantly enlarged. A right-sided AICD is noted, with a single lead ending at the right ventricle. Trace pericardial fluid is noted. No mediastinal lymphadenopathy is seen. The great vessels are grossly unremarkable in appearance. No axillary lymphadenopathy is seen. The visualized portions of the thyroid gland are unremarkable in appearance. Small volume ascites is noted within the upper abdomen. The visualized portions of the liver and spleen are grossly unremarkable. The visualized portions of the gallbladder, pancreas and adrenal glands are within normal limits. Scattered vague hypodense and hyperdense cystic foci are seen within both kidneys, with mild bilateral renal atrophy and scarring. No acute osseous abnormalities are seen. Review of the MIP images confirms the above findings. IMPRESSION: 1. No evidence of pulmonary embolus. 2. Trace right-sided pleural effusion, with mild bibasilar atelectasis. 3. Significant cardiomegaly.  Trace pericardial fluid noted. 4. Small volume ascites within the upper abdomen. 5. Mild bilateral renal atrophy and scarring, with scattered vague hypodense and hyperdense cystic foci in both kidneys. Electronically Signed   By: Roanna Raider M.D.   On: 04/12/2016 01:11   US Venous Img Upper Uni Left  04/11/2016  CLINICAL DATA:  Patient was diagnosed with left internal jugular vein deep venous thrombosis on 04/01/2016, now presenting with worsening swelling and pain in the left arm. History of left brachiocephalic AV fistula, jump graft revision of left arm AV fistula and ligation of the aneurysmal fistula 03/22/2016. The patient is anticoagulated. EXAM: LEFT UPPER EXTREMITY VENOUS DOPPLER ULTRASOUND TECHNIQUE: Gray-scale sonography with graded compression, as well as color Doppler and duplex ultrasound were performed to evaluate the upper extremity deep venous system from the  level of the subclavian vein and including the jugular, axillary, basilic, radial, ulnar and upper cephalic vein. Spectral Doppler was utilized to evaluate flow at rest and with distal augmentation maneuvers. COMPARISON:  None. FINDINGS: Contralateral Subclavian Vein: Respiratory phasicity is normal and symmetric with the symptomatic side. No evidence of thrombus. Normal compressibility. Internal Jugular Vein: There is an occlusive thrombus within long segment of the internal jugular vein, which demonstrates no compressibility. Subclavian Vein: No evidence of thrombus. Normal compressibility, respiratory phasicity and response to augmentation. Axillary Vein: No evidence of thrombus. Normal compressibility, respiratory phasicity and response to augmentation. Cephalic Vein: No evidence of thrombus. Normal compressibility, respiratory phasicity and response to augmentation. Basilic Vein: Not visualized. Brachial Veins: Limited visualization. Radial Veins: No evidence of thrombus. Normal compressibility, respiratory phasicity and response to augmentation. Ulnar Veins: No evidence of thrombus. Normal compressibility, respiratory phasicity and response to augmentation. Venous Reflux:  None visualized. Other Findings: O occluded left av fistula, post ligation is seen in the upper arm. A mid upper arm jump graft is patent  and measures 9 mm in cross-section. Left upper arm subcutaneous soft tissue edema is seen. IMPRESSION: Occlusive thrombus within long segment of the internal jugular vein to the level of the takeoff of the subclavian vein. Patent mid left upper arm jump graft measuring 9 mm in cross-section. Soft tissue edema. Electronically Signed   By: Ted Mcalpine M.D.   On: 04/11/2016 13:36    Scheduled Meds: . amLODipine  5 mg Oral Daily  . apixaban  5 mg Oral BID  . carvedilol  6.25 mg Oral BID WC  . cinacalcet  30 mg Oral Q supper  . hydrALAZINE  25 mg Oral TID  . isosorbide dinitrate  10 mg Oral TID   . multivitamin  1 tablet Oral Daily  . sertraline  100 mg Oral Daily  . sevelamer carbonate  2,400 mg Oral TID WC  . sodium chloride flush  3 mL Intravenous Q12H   Continuous Infusions:   Principal Problem:   Chest pain Active Problems:   ESRD (end stage renal disease) on dialysis (HCC)   Systolic CHF, chronic (HCC)   Anemia of chronic renal failure, stage 5 (HCC)   Elevated serum hCG   AICD (automatic cardioverter/defibrillator) present   Failed kidney transplant   Hypertensive urgency   DVT (deep venous thrombosis), left   Chronic anticoagulation  Time spent:   Standley Dakins, MD, FAAFP [facility] Pager [phone]  If 7PM-7AM, please contact night-coverage [email or web address] Password [email or web address] 04/12/2016, 7:39 AM    LOS: 1 day

## 2016-04-12 NOTE — Consult Note (Addendum)
CARDIOLOGY CONSULT NOTE   Patient ID: [name] Small MRN: [id number] DOB/AGE: 1990/10/25 25 y.o.  Admit date: 04/11/2016  Primary Physician   Jeanann Lewandowsky, MD Primary Cardiologist   Dr. Elberta Fortis Reason for Consultation   Chest pain Requesting Physician  Dr. Laural Benes  HPI: [name] Small is a 25 y.o. female with a history of Chronic systolic CHF, non ischemic cardiomyopathy with EF of 20-25% in Jan 2017 s/p AICD, HTn, chronic pain syndrome, ESRD on HD MWF, failed renal transplant, PE, chronic anemia with recent admission to [facility] for LUE DVT who presents 04/11/16 with pluritic chest pain.   She was doing well when last seen by Dr. Elberta Fortis 03/21/16. Device function was normal at that time.   She was admitted 03/31/16 to 04/02/16 at [facility] for LUE DVT (Internal jugular vein). Seen by vascular surgery consult to evaluate the fistula no evidence of infection. Not a candidate for thrombolytics.  Easy empiric antibiotics. Treated with  lovenox during admission and discharged on eliquis.   States compliant with medications. Came to [facility] ED with 3 days hx of left upper chest pain. She describes that pain as constant sharp that worse with deep breath. Nothing makes better. Getting worse. Admites to have LE edema. No dizziness, orthopnea, PND, syncope, dyspnea, abdominal pain, melena or blood in her stool or urine. Report of nausea and diarrhea. No AICD fire.   Elevated B HCG for the past three months. Chest Xray showed stable cardiomegaly ad atelectasis. LUE doppler showed persistent DVT. Scr of 15.36. BNP > 4500. Troponin of 0.54-->0.53-->0.46-->0.39. She has chronically elevated troponin - seems baseline around 0.1-0.2.  EKG on admission showed sinus rhythm at rate of 108 bpm, prolonged PR interval,  ST/T wave abnormality in inferior lateral leads which seems chronic. EKG this morning showed sinus rhythm at rate of 87 bpm, with resolution of TWI in lead avF and V6. CT angio of chest showed on PE,  trace right sided pleural effusion, trace pericardiac fluid. Pending echo.   Past Medical History  Diagnosis Date  . Hemodialysis patient (HCC)     Mon. Wed. Fri  . Hypertension   . Pulmonary emboli (HCC) 01/2012    Bilateral, moderate clot burden, areas of pulmonary infarction and central necrosis  . CHF (congestive heart failure) (HCC)   . Cardiomyopathy   . Dysrhythmia     at times per pt.  . Anemia   . H/O transfusion of packed red blood cells   . Cellulitis and abscess of face 03/22/2013  . Chronic pain   . GERD (gastroesophageal reflux disease)   . Depression   . Hyperkalemia 09/2015  . Pelvic fracture (HCC) 06/2015    "on the right"  . Osteoporosis   . Narcotic abuse, continuous   . 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 Anxiety   . Sickle cell anemia (HCC)   . 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 Polycystic kidney disease   . Renal insufficiency   . ESRD (end stage renal disease) on dialysis [facility])     "MWF;  [facility]" (04/11/2016)  . Chronic anticoagulation      Past Surgical History  Procedure Laterality Date  . Nephrectomy Right     third kidney placed in 2008, and body rejected in 2012   . Av fistula placement Bilateral     "right side stopped working & never  had it revised" (04/11/2016)  . Kidney transplant  2008    failed  . Incision and drainage abscess Right 03/21/2013    Procedure: INCISION AND DRAINAGE RIGHT CHEEK ABSCESS REMOVAL OF FOREIGN BODY;  Surgeon: Serena Colonel, MD;  Location: [facility] OR;  Service: ENT;  Laterality: Right;  . Implantable cardioverter defibrillator implant Right 12/2014  . Peripheral vascular catheterization Left 03/09/2016    Procedure: A/V Shuntogram/Fistulagram;  Surgeon: Annice Needy, MD;  Location: [facility] INVASIVE CV LAB;  Service: Cardiovascular;  Laterality: Left;  .  Insertion of dialysis catheter Right Mar 09, 2016    thigh, Dr. Wyn Quaker, [facility]  . Cardiac catheterization    . Revison of arteriovenous fistula Left 03/22/2016    Procedure: REVISON OF ARTERIOVENOUS FISTULA ( ARTEGRAFT );  Surgeon: Annice Needy, MD;  Location: [facility] ORS;  Service: Vascular;  Laterality: Left;  . Tonsillectomy and adenoidectomy  ~ 2000    Allergies  Allergen Reactions  . Aspirin Other (See Comments)    Interacts with Coreg  . Tramadol Anaphylaxis  . Vicodin [Hydrocodone-Acetaminophen] Hives  . Buprenorphine Hcl Itching and Other (See Comments)    Ok with oxycodone  . Iohexol Itching  . Morphine And Related Itching and Other (See Comments)    Ok with oxycodone    I have reviewed the patient's current medications . amLODipine  5 mg Oral Daily  . apixaban  5 mg Oral BID  . carvedilol  6.25 mg Oral BID WC  . cinacalcet  30 mg Oral Q supper  . hydrALAZINE  25 mg Oral TID  . isosorbide dinitrate  10 mg Oral TID  . multivitamin  1 tablet Oral Daily  . sertraline  100 mg Oral Daily  . sevelamer carbonate  2,400 mg Oral TID WC  . sodium chloride flush  3 mL Intravenous Q12H     sodium chloride, acetaminophen, diphenhydrAMINE, hydrALAZINE, HYDROmorphone (DILAUDID) injection, LORazepam, nitroGLYCERIN, ondansetron (ZOFRAN) IV, oxyCODONE, sevelamer carbonate, sodium chloride flush  Prior to Admission medications   Medication Sig Start Date End Date Taking? Authorizing Provider  amLODipine (NORVASC) 5 MG tablet Take 1 tablet (5 mg total) by mouth daily. 03/21/16  Yes Will Jorja Loa, MD  apixaban (ELIQUIS) 5 MG TABS tablet Take 2 tablets (10 mg total) by mouth 2 (two) times daily. Till 04/08/16 04/02/16 04/11/16 Yes Enedina Finner, MD  carvedilol (COREG) 6.25 MG tablet Take 6.25 mg by mouth 2 (two) times daily with a meal.    Yes Historical Provider, MD  cinacalcet (SENSIPAR) 30 MG tablet Take 1 tablet (30 mg total) by mouth daily with breakfast. Patient taking differently: Take 30 mg  by mouth daily with supper.  03/13/16  Yes Quentin Angst, MD  diclofenac sodium (VOLTAREN) 1 % GEL Apply 2 g topically 4 (four) times daily. 02/29/16  Yes Quentin Angst, MD  hydrALAZINE (APRESOLINE) 25 MG tablet Take 1 tablet (25 mg total) by mouth 3 (three) times daily. 03/21/16  Yes Will Jorja Loa, MD  isosorbide dinitrate (ISORDIL) 10 MG tablet Take 1 tablet (10 mg total) by mouth 3 (three) times daily. 02/01/16  Yes Olugbemiga Annitta Needs, MD  LORazepam (ATIVAN) 1 MG tablet Take 1 tablet (1 mg total) by mouth daily as needed for anxiety. 04/06/16  Yes Massie Maroon, FNP  multivitamin (RENA-VIT) TABS tablet Take 1 tablet by mouth daily. 01/04/16  Yes Quentin Angst, MD  oxyCODONE (OXY IR/ROXICODONE) 5 MG immediate release tablet Take 1 tablet (5 mg total) by mouth  every 6 (six) hours as needed for severe pain. 04/06/16  Yes Massie Maroon, FNP  sertraline (ZOLOFT) 100 MG tablet Take 1 tablet (100 mg total) by mouth daily. 03/13/16  Yes Quentin Angst, MD  sevelamer carbonate (RENVELA) 800 MG tablet Take 2-3 tablets (1,600-2,400 mg total) by mouth 3 (three) times daily with meals. 3 tabs with meals, 2 tabs with snacks 01/04/16 01/03/17 Yes Olugbemiga Annitta Needs, MD  apixaban (ELIQUIS) 5 MG TABS tablet Take 1 tablet (5 mg total) by mouth 2 (two) times daily. From 04/09/16 Patient not taking: Reported on 04/11/2016 04/09/16   Enedina Finner, MD     Social History   Social History  . Marital Status: Single    Spouse Name: N/A  . Number of Children: N/A  . Years of Education: N/A   Occupational History  . Not on file.   Social History Main Topics  . Smoking status: Former Smoker -- 0.00 packs/day for 1 years    Types: Cigarettes    Quit date: 12/08/2015  . Smokeless tobacco: Never Used  . Alcohol Use: No  . Drug Use: No  . Sexual Activity: Not Currently    Birth Control/ Protection: None   Other Topics Concern  . Not on file   Social History Narrative   Was smoking 3 cigs  per day. Lives at home with roommates, has some family locally.     Family Status  Relation Status Death Age  . Father Alive   . Mother Alive    Family History  Problem Relation Age of Onset  . Polycystic kidney disease Father   . Hypertension Father       ROS:  Full 14 point review of systems complete and found to be negative unless listed above.  Physical Exam: Blood pressure 134/86, pulse 88, temperature 97.9 F (36.6 C), temperature source Oral, resp. rate 10, height 5\' 3"  (1.6 m), weight 132 lb 6.4 oz (60.056 kg), last menstrual period 04/12/2011, SpO2 100 %.  General: Well developed, well nourished, female in no acute distress Head: Eyes PERRLA, No xanthomas. Normocephalic and atraumatic, oropharynx without edema or exudate.  Lungs: Resp regular and unlabored, CTA. Heart: RRR no s3, s4, or murmurs..   Neck: No carotid bruits. No lymphadenopathy.  JVD. Abdomen: Bowel sounds present, abdomen soft and non-tender without masses or hernias noted. Msk:  No spine or cva tenderness. No weakness, no joint deformities or effusions. Extremities: No clubbing, cyanosis. 1+ BL LE  edema. DP 2+ and equal bilaterally. LUE fistula. L arm swollen compared to right.  Neuro: Alert and oriented X 3. No focal deficits noted. Psych:  Good affect, responds appropriately Skin: No rashes or lesions noted.  Labs:   Lab Results  Component Value Date   WBC 4.4 04/12/2016   HGB 9.0* 04/12/2016   HCT 28.8* 04/12/2016   MCV 88.6 04/12/2016   PLT 249 04/12/2016    Recent Labs  04/12/16 0445  INR 1.55*    Recent Labs Lab 04/12/16 0445  NA 137  K 4.7  CL 98*  CO2 20*  BUN 94*  CREATININE 15.36*  CALCIUM 8.7*  PROT 7.3  BILITOT 0.8  ALKPHOS 272*  ALT 5*  AST 24  GLUCOSE 148*  ALBUMIN 2.9*   MAGNESIUM  Date Value Ref Range Status  04/02/2015 2.4 1.7 - 2.4 mg/dL Final    Recent Labs  04/54/09 1130 04/11/16 1450 04/11/16 2308 04/12/16 0445  TROPONINI 0.54* 0.53* 0.46* 0.39*    No  results for input(s): TROPIPOC in the last 72 hours. PRO B NATRIURETIC PEPTIDE (BNP)  Date/Time Value Ref Range Status  04/09/2013 10:05 AM >70000.0* 0 - 125 pg/mL Final    Comment:    RESULT CONFIRMED BY AUTOMATED DILUTION  02/20/2012 11:26 PM 20591.0* 0 - 125 pg/mL Final   Lab Results  Component Value Date   CHOL 188 04/03/2015   HDL 51 04/03/2015   LDLCALC 104* 04/03/2015   TRIG 167* 04/03/2015   Lab Results  Component Value Date   DDIMER 2.84* 01/18/2016   LIPASE  Date/Time Value Ref Range Status  02/12/2016 06:50 PM 41 11 - 51 U/L Final   TSH  Date/Time Value Ref Range Status  04/03/2015 12:50 AM 10.706* 0.350 - 4.500 uIU/mL Final  01/26/2015 11:52 AM 5.046* 0.350 - 4.500 uIU/mL Final   T3, FREE  Date/Time Value Ref Range Status  04/04/2015 10:27 AM 2.4 2.0 - 4.4 pg/mL Final    Comment:    (NOTE) Performed At: [facility] [address] [place], [place] [id number] Mila Homer MD WU:[id number]    FERRITIN  Date/Time Value Ref Range Status  02/22/2015 06:19 AM 576* 11 - 307 ng/mL Final   TIBC  Date/Time Value Ref Range Status  02/22/2015 06:19 AM 192* 250 - 450 ug/dL Final   IRON  Date/Time Value Ref Range Status  02/22/2015 06:19 AM 37 28 - 170 ug/dL Final   RETIC CT PCT  Date/Time Value Ref Range Status  02/12/2016 06:50 PM 2.5 0.4 - 3.1 % Final    Echo: 10/2015 LV EF: 20% - 25%  ------------------------------------------------------------------- Indications: Chest pain 786.51.  ------------------------------------------------------------------- History: PMH: History of pericardial effusion. Congestive heart failure. PMH: Pulmonary embolus. Risk factors: Current tobacco use. Hypertension.  ------------------------------------------------------------------- Study Conclusions  - Left ventricle: The cavity size was moderately dilated. Wall  thickness was normal. Systolic function was severely reduced.  The  estimated ejection fraction was in the range of 20% to 25%.  Diffuse hypokinesis. - Mitral valve: There was mild regurgitation. Valve area by  continuity equation (using LVOT flow): 1.74 cm^2. - Left atrium: The atrium was mildly dilated. - Right atrium: The atrium was mildly dilated. - Atrial septum: No defect or patent foramen ovale was identified. - Impressions: Small posterior and lateral effusion on tamponade.  Impressions:  - Small posterior and lateral effusion on tamponade.  ECG:  As above  Cath 07/2014 SUMMARY:  -- HEMODYNAMICS: -- Hemodynamic assessment demonstrates mild systemic hypertension and moderately to severely elevated LVEDP.  -- CARDIAC STRUCTURES: -- Global left ventricular function was severely depressed. EF estimated was 15 %.  -- CORONARY CIRCULATION: -- There was no angiographic evidence for occlusive coronary artery disease.  -- SUMMARY NOTE: --  1. Severely reduced LVEF ~ 20 with mildly dilated LV and global severe hypokinesis. 2. Widely patent cronaries without significant obstruction. 3. Markedly elevated LVEDP.    Radiology:  Dg Chest 2 View  04/11/2016  CLINICAL DATA:  Shortness of breath.  Chest pain for 2 days. EXAM: CHEST  2 VIEW COMPARISON:  03/11/2016 FINDINGS: Stable moderate cardiomegaly. AICD noted. This is unchanged imposition. No edema. Lingular subsegmental atelectasis. No pleural effusion. IVC filter noted. IMPRESSION: 1. Moderate cardiomegaly, stable. 2. New lingular subsegmental atelectasis. Electronically Signed   By: Gaylyn Rong M.D.   On: 04/11/2016 12:05   Ct Angio Chest Pe W/cm &/or Wo Cm  04/12/2016  CLINICAL DATA:  Acute onset of pleuritic chest pain. Recently diagnosed with deep venous thrombosis at  the left upper extremity. Initial encounter. EXAM: CT ANGIOGRAPHY CHEST WITH CONTRAST TECHNIQUE: Multidetector CT imaging of the chest was performed using the standard protocol during bolus administration of  intravenous contrast. Multiplanar CT image reconstructions and MIPs were obtained to evaluate the vascular anatomy. CONTRAST:  80 mL of Isovue 370 IV contrast COMPARISON:  Chest radiograph performed 04/11/2016 FINDINGS: There is no evidence of pulmonary embolus. A trace right-sided pleural effusion is noted, with mild bibasilar atelectasis. The lungs are otherwise grossly clear. There is no evidence of pneumothorax. No masses are identified; no abnormal focal contrast enhancement is seen. The heart is significantly enlarged. A right-sided AICD is noted, with a single lead ending at the right ventricle. Trace pericardial fluid is noted. No mediastinal lymphadenopathy is seen. The great vessels are grossly unremarkable in appearance. No axillary lymphadenopathy is seen. The visualized portions of the thyroid gland are unremarkable in appearance. Small volume ascites is noted within the upper abdomen. The visualized portions of the liver and spleen are grossly unremarkable. The visualized portions of the gallbladder, pancreas and adrenal glands are within normal limits. Scattered vague hypodense and hyperdense cystic foci are seen within both kidneys, with mild bilateral renal atrophy and scarring. No acute osseous abnormalities are seen. Review of the MIP images confirms the above findings. IMPRESSION: 1. No evidence of pulmonary embolus. 2. Trace right-sided pleural effusion, with mild bibasilar atelectasis. 3. Significant cardiomegaly.  Trace pericardial fluid noted. 4. Small volume ascites within the upper abdomen. 5. Mild bilateral renal atrophy and scarring, with scattered vague hypodense and hyperdense cystic foci in both kidneys. Electronically Signed   By: Roanna Raider M.D.   On: 04/12/2016 01:11   US Venous Img Upper Uni Left  04/11/2016  CLINICAL DATA:  Patient was diagnosed with left internal jugular vein deep venous thrombosis on 04/01/2016, now presenting with worsening swelling and pain in the left  arm. History of left brachiocephalic AV fistula, jump graft revision of left arm AV fistula and ligation of the aneurysmal fistula 03/22/2016. The patient is anticoagulated. EXAM: LEFT UPPER EXTREMITY VENOUS DOPPLER ULTRASOUND TECHNIQUE: Gray-scale sonography with graded compression, as well as color Doppler and duplex ultrasound were performed to evaluate the upper extremity deep venous system from the level of the subclavian vein and including the jugular, axillary, basilic, radial, ulnar and upper cephalic vein. Spectral Doppler was utilized to evaluate flow at rest and with distal augmentation maneuvers. COMPARISON:  None. FINDINGS: Contralateral Subclavian Vein: Respiratory phasicity is normal and symmetric with the symptomatic side. No evidence of thrombus. Normal compressibility. Internal Jugular Vein: There is an occlusive thrombus within long segment of the internal jugular vein, which demonstrates no compressibility. Subclavian Vein: No evidence of thrombus. Normal compressibility, respiratory phasicity and response to augmentation. Axillary Vein: No evidence of thrombus. Normal compressibility, respiratory phasicity and response to augmentation. Cephalic Vein: No evidence of thrombus. Normal compressibility, respiratory phasicity and response to augmentation. Basilic Vein: Not visualized. Brachial Veins: Limited visualization. Radial Veins: No evidence of thrombus. Normal compressibility, respiratory phasicity and response to augmentation. Ulnar Veins: No evidence of thrombus. Normal compressibility, respiratory phasicity and response to augmentation. Venous Reflux:  None visualized. Other Findings: O occluded left av fistula, post ligation is seen in the upper arm. A mid upper arm jump graft is patent and measures 9 mm in cross-section. Left upper arm subcutaneous soft tissue edema is seen. IMPRESSION: Occlusive thrombus within long segment of the internal jugular vein to the level of the takeoff of the  subclavian  vein. Patent mid left upper arm jump graft measuring 9 mm in cross-section. Soft tissue edema. Electronically Signed   By: Ted Mcalpine M.D.   On: 04/11/2016 13:36    ASSESSMENT AND PLAN:     1. Chest pain - Pleuritc in nature for the past 3 days. Some what improved after IV diluadid. CTA negative for PE. Pending echo to evaluation for pericarditis.   2. Elevated troponin - Tending down. Level is elevated compared to her baseline. EKG with non specific chronic changes however looks better this AM. Seems her elevated troponin likely due to accelerated HTN and CKD (Scr of 15 today).   3. Non ischemic cardiomyopathy - s/p [employer] ICD in place. EF of 20-25% Jan 2017. Pending echo. BNP > 4500. Continue coreg, hydralazine and imdur. Junctional rhythm on EKG ? this morning with prolonged PR interval. Per Dr. Gershon Crane note increased coreg to 12.5mg  however we will hold given prolonged PR interval.  - HD for volume management. Cath 07/2014 showed normal coronaries.       ESRD (end stage renal disease) on dialysis (HCC)   Systolic CHF, chronic (HCC)   Anemia of chronic renal failure, stage 5 (HCC)   Elevated serum hCG   AICD (automatic cardioverter/defibrillator) present   Failed kidney transplant   Hypertensive urgency   DVT (deep venous thrombosis), left   Chronic anticoagulation   Signed: Bhagat,Bhavinkumar, PA 04/12/2016, 7:58 AM Pager [phone]  Co-Sign MD Patient seen and examined and history reviewed. Agree with above findings and plan. 25 yo WF with history of ESRD secondary to PCKD on hemodialysis. She also has a nonischemic cardiomyopathy with EF 20-25%. Normal coronaries by cath in 2015. Recent LUE DVT on Eliquis. Presents with 3 day history of pleuritic chest pain. Worse with deep breath and improved with sitting up. No recent fever, chills, cough, or URI. Reports normal dry weight 55 kg. No swelling. No dyspnea.  On exam she is a chronically ill  appearing young BF in NAD.  VSS. Lungs are clear. CV exam with loud S3. No rub. Abdomen soft an NT. LUE fistula with some swelling Left arm. Right femoral vascath in place.  Laboratory data reviewed as above.  Ecg shows NSR with long first degree AV block. No acute change.  Impression: Pleuritic chest pain. No evidence of PE on CT. May represent pericarditis based on history but no rub on exam and no ST elevation on Ecg. Will check Echo today. It may be reasonable to try her on colchicine empirically although at this time dose not meet diagnostic criteria for pericarditis. She will need dialysis today since weight is up 5 kg from dry weight. Chronically elevated troponin of unclear significance in setting of ESRD. Prior ischemic work up negative. I would not titrate beta blocker further given very long PR interval.   Peter [place], MDFACC 04/12/2016 8:54 AM

## 2016-04-12 NOTE — Consult Note (Signed)
[name] Small is a 25 y.o. female with a history of ESRD on HD MWF under care of Dr. Candiss Norse in [place].  In early June she had a revision of the LUE AVF with an Artegraft by Dr. Lucky Cowboy.  In mid June presented back to [facility] with a swollen LUE and workup initially suspicious for cellulitis but was found to have a left IJ DVT. She is taking Eliquis.  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  She presents now with plueritic chest pain and we are asked to provide dialysis service.  She was previously discharged from our practice due to non-adherence to treatment. CT was neg for PE.  Past Medical History  Diagnosis Date  . Hemodialysis patient ([place])     Mon. Wed. Fri  . Hypertension   . Pulmonary emboli (HCC) 01/2012    Bilateral, moderate clot burden, areas of pulmonary infarction and central necrosis  . CHF (congestive heart failure) ([place])   . Cardiomyopathy   . Dysrhythmia     at times per pt.  . Anemia   . H/O transfusion of packed red blood cells   . Cellulitis and abscess of face 03/22/2013  . Chronic pain   . GERD (gastroesophageal reflux disease)   . Depression   . Hyperkalemia 09/2015  . Pelvic fracture ([place]) 06/2015    "on the right"  . Osteoporosis   . Narcotic abuse, continuous   . AICD (automatic cardioverter/defibrillator) present 12/16/14    [employer]  . Anxiety   . Sickle cell anemia (HCC)   . End stage renal disease ([place])     s/p cadaveric renal transplant 07/2007 and transplant failure 08/2011, then transplant nephrectomy 08/2011  . Polycystic kidney disease   . Renal insufficiency   . ESRD (end stage renal disease) on dialysis [facility])     "MWF; [place]" (04/11/2016)  . Chronic anticoagulation    Past Surgical History  Procedure Laterality Date  . Nephrectomy Right     third kidney placed in 2008, and  body rejected in 2012   . Av fistula placement Bilateral     "right side stopped working & never had it revised" (04/11/2016)  . Kidney transplant  2008    failed  . Incision and drainage abscess Right 03/21/2013    Procedure: INCISION AND DRAINAGE RIGHT CHEEK ABSCESS REMOVAL OF FOREIGN BODY;  Surgeon: Izora Gala, MD;  Location: [place];  Service: ENT;  Laterality: Right;  . Implantable cardioverter defibrillator implant Right 12/2014  . Peripheral vascular catheterization Left 03/09/2016    Procedure: A/V Shuntogram/Fistulagram;  Surgeon: Algernon Huxley, MD;  Location: [place] [facility];  Service: Cardiovascular;  Laterality: Left;  . Insertion of dialysis catheter Right Mar 09, 2016    thigh, Dr. Lucky Cowboy, [facility]  . Cardiac catheterization    . Revison of arteriovenous fistula Left 03/22/2016    Procedure: REVISON OF ARTERIOVENOUS FISTULA ( ARTEGRAFT );  Surgeon: Algernon Huxley, MD;  Location: [facility];  Service: Vascular;  Laterality: Left;  . Tonsillectomy and adenoidectomy  ~ 2000   Social History:  reports that she quit smoking about 4 months ago. Her smoking use included Cigarettes. She smoked 0.00 packs per day for 1 year. She has never used smokeless tobacco. She reports that she does not drink alcohol or use illicit drugs. Allergies:  Allergies  Allergen Reactions  .  Aspirin Other (See Comments)    Interacts with Coreg  . Tramadol Anaphylaxis  . Vicodin [Hydrocodone-Acetaminophen] Hives  . Buprenorphine Hcl Itching and Other (See Comments)    Ok with oxycodone  . Iohexol Itching  . Morphine And Related Itching and Other (See Comments)    Ok with oxycodone   Family History  Problem Relation Age of Onset  . Polycystic kidney disease Father   . Hypertension Father     Medications:  Scheduled: . amLODipine  5 mg Oral Daily  . apixaban  5 mg Oral BID  . carvedilol  6.25 mg Oral BID WC  . cinacalcet  30 mg Oral Q supper  . hydrALAZINE  25 mg Oral TID  . isosorbide dinitrate  10 mg  Oral TID  . multivitamin  1 tablet Oral Daily  . nicotine  14 mg Transdermal Daily  . sertraline  100 mg Oral Daily  . sevelamer carbonate  2,400 mg Oral TID WC  . sodium chloride flush  3 mL Intravenous Q12H   ROS: as per HPI  Blood pressure 119/81, pulse 99, temperature 97.5 F (36.4 C), temperature source Oral, resp. rate 12, height '5\' 3"'  (1.6 m), weight 61 kg (134 lb 7.7 oz), last menstrual period 04/12/2011, SpO2 100 %.  General appearance: alert and cooperative Head: Normocephalic, without obvious abnormality, atraumatic Eyes: negative Ears: normal TM's and external ear canals both ears Nose: Nares normal. Septum midline. Mucosa normal. No drainage or sinus tenderness. Throat: lips, mucosa, and tongue normal; teeth and gums normal Resp: clear to auscultation bilaterally Chest wall: no tenderness  AICD RUCW Cardio: regular rate and rhythm, S1, S2 normal, no murmur, click, rub or gallop GI: soft, non-tender; bowel sounds normal; no masses,  no organomegaly Extremities: right groin HD cath LUE failed AVF and tender AVGG, no redness Skin: Skin color, texture, turgor normal. No rashes or lesions Neurologic: Grossly normal Results for orders placed or performed during the hospital encounter of 04/11/16 (from the past 48 hour(s))  Basic metabolic panel     Status: Abnormal   Collection Time: 04/11/16 11:30 AM  Result Value Ref Range   Sodium 137 135 - 145 mmol/L   Potassium 4.2 3.5 - 5.1 mmol/L   Chloride 99 (L) 101 - 111 mmol/L   CO2 20 (L) 22 - 32 mmol/L   Glucose, Bld 82 65 - 99 mg/dL   BUN 88 (H) 6 - 20 mg/dL   Creatinine, Ser 14.58 (H) 0.44 - 1.00 mg/dL   Calcium 9.6 8.9 - 10.3 mg/dL   GFR calc non Af Amer 3 (L) >60 mL/min   GFR calc Af Amer 4 (L) >60 mL/min    Comment: (NOTE) The eGFR has been calculated using the CKD EPI equation. This calculation has not been validated in all clinical situations. eGFR's persistently <60 mL/min signify possible Chronic  Kidney Disease.    Anion gap 18 (H) 5 - 15  CBC     Status: Abnormal   Collection Time: 04/11/16 11:30 AM  Result Value Ref Range   WBC 5.9 4.0 - 10.5 K/uL   RBC 3.22 (L) 3.87 - 5.11 MIL/uL   Hemoglobin 9.5 (L) 12.0 - 15.0 g/dL   HCT 29.3 (L) 36.0 - 46.0 %   MCV 91.0 78.0 - 100.0 fL   MCH 29.5 26.0 - 34.0 pg   MCHC 32.4 30.0 - 36.0 g/dL   RDW 18.7 (H) 11.5 - 15.5 %   Platelets 264 150 - 400 K/uL  Troponin  I     Status: Abnormal   Collection Time: 04/11/16 11:30 AM  Result Value Ref Range   Troponin I 0.54 (HH) <0.031 ng/mL    Comment:        POSSIBLE MYOCARDIAL ISCHEMIA. SERIAL TESTING RECOMMENDED. REPEATED TO VERIFY CRITICAL RESULT CALLED TO, READ BACK BY AND VERIFIED WITH: MARVA SIMMS RN '@[id number]'  04/11/16 OLSONM   Brain natriuretic peptide     Status: Abnormal   Collection Time: 04/11/16 11:30 AM  Result Value Ref Range   B Natriuretic Peptide >4500.0 (H) 0.0 - 100.0 pg/mL  hCG, quantitative, pregnancy     Status: Abnormal   Collection Time: 04/11/16 11:30 AM  Result Value Ref Range   hCG, Beta Chain, Quant, S 33 (H) <5 mIU/mL    Comment:          GEST. AGE      CONC.  (mIU/mL)   <=1 WEEK        5 - 50     2 WEEKS       50 - 500     3 WEEKS       100 - 10,000     4 WEEKS     1,000 - 30,000     5 WEEKS     3,500 - 115,000   6-8 WEEKS     12,000 - 270,000    12 WEEKS     15,000 - 220,000        FEMALE AND NON-PREGNANT FEMALE:     LESS THAN 5 mIU/mL   Troponin I     Status: Abnormal   Collection Time: 04/11/16  2:50 PM  Result Value Ref Range   Troponin I 0.53 (HH) <0.031 ng/mL    Comment:        POSSIBLE MYOCARDIAL ISCHEMIA. SERIAL TESTING RECOMMENDED. REPEATED TO VERIFY CRITICAL RESULT CALLED TO, READ BACK BY AND VERIFIED WITH: CALLED TO S.REYNOLDS AT [id number] ON [id number] BY S.ROY   MRSA PCR Screening     Status: None   Collection Time: 04/11/16  6:27 PM  Result Value Ref Range   MRSA by PCR NEGATIVE NEGATIVE    Comment:        The GeneXpert MRSA Assay  (FDA approved for NASAL specimens only), is one component of a comprehensive MRSA colonization surveillance program. It is not intended to diagnose MRSA infection nor to guide or monitor treatment for MRSA infections.   Troponin I     Status: Abnormal   Collection Time: 04/11/16 11:08 PM  Result Value Ref Range   Troponin I 0.46 (H) <0.031 ng/mL    Comment:        PERSISTENTLY INCREASED TROPONIN VALUES IN THE RANGE OF 0.04-0.49 ng/mL CAN BE SEEN IN:       -UNSTABLE ANGINA       -CONGESTIVE HEART FAILURE       -MYOCARDITIS       -CHEST TRAUMA       -ARRYHTHMIAS       -LATE PRESENTING MYOCARDIAL INFARCTION       -COPD   CLINICAL FOLLOW-UP RECOMMENDED.   Troponin I     Status: Abnormal   Collection Time: 04/12/16  4:45 AM  Result Value Ref Range   Troponin I 0.39 (H) <0.031 ng/mL    Comment:        PERSISTENTLY INCREASED TROPONIN VALUES IN THE RANGE OF 0.04-0.49 ng/mL CAN BE SEEN IN:       -UNSTABLE ANGINA       -  CONGESTIVE HEART FAILURE       -MYOCARDITIS       -CHEST TRAUMA       -ARRYHTHMIAS       -LATE PRESENTING MYOCARDIAL INFARCTION       -COPD   CLINICAL FOLLOW-UP RECOMMENDED.   CBC     Status: Abnormal   Collection Time: 04/12/16  4:45 AM  Result Value Ref Range   WBC 4.4 4.0 - 10.5 K/uL   RBC 3.25 (L) 3.87 - 5.11 MIL/uL   Hemoglobin 9.0 (L) 12.0 - 15.0 g/dL   HCT 28.8 (L) 36.0 - 46.0 %   MCV 88.6 78.0 - 100.0 fL   MCH 27.7 26.0 - 34.0 pg   MCHC 31.3 30.0 - 36.0 g/dL   RDW 18.9 (H) 11.5 - 15.5 %   Platelets 249 150 - 400 K/uL  Basic metabolic panel     Status: Abnormal   Collection Time: 04/12/16  4:45 AM  Result Value Ref Range   Sodium 137 135 - 145 mmol/L   Potassium 4.7 3.5 - 5.1 mmol/L   Chloride 98 (L) 101 - 111 mmol/L   CO2 20 (L) 22 - 32 mmol/L   Glucose, Bld 148 (H) 65 - 99 mg/dL   BUN 94 (H) 6 - 20 mg/dL   Creatinine, Ser 15.36 (H) 0.44 - 1.00 mg/dL   Calcium 8.7 (L) 8.9 - 10.3 mg/dL   GFR calc non Af Amer 3 (L) >60 mL/min   GFR calc  Af Amer 3 (L) >60 mL/min    Comment: (NOTE) The eGFR has been calculated using the CKD EPI equation. This calculation has not been validated in all clinical situations. eGFR's persistently <60 mL/min signify possible Chronic Kidney Disease.    Anion gap 19 (H) 5 - 15  Hepatic function panel     Status: Abnormal   Collection Time: 04/12/16  4:45 AM  Result Value Ref Range   Total Protein 7.3 6.5 - 8.1 g/dL   Albumin 2.9 (L) 3.5 - 5.0 g/dL   AST 24 15 - 41 U/L   ALT 5 (L) 14 - 54 U/L   Alkaline Phosphatase 272 (H) 38 - 126 U/L   Total Bilirubin 0.8 0.3 - 1.2 mg/dL   Bilirubin, Direct 0.2 0.1 - 0.5 mg/dL   Indirect Bilirubin 0.6 0.3 - 0.9 mg/dL  Protime-INR     Status: Abnormal   Collection Time: 04/12/16  4:45 AM  Result Value Ref Range   Prothrombin Time 18.7 (H) 11.6 - 15.2 seconds   INR 1.55 (H) 0.00 - 1.49  APTT     Status: Abnormal   Collection Time: 04/12/16  4:45 AM  Result Value Ref Range   aPTT 42 (H) 24 - 37 seconds    Comment:        IF BASELINE aPTT IS ELEVATED, SUGGEST PATIENT RISK ASSESSMENT BE USED TO DETERMINE APPROPRIATE ANTICOAGULANT THERAPY.   Troponin I     Status: Abnormal   Collection Time: 04/12/16 10:59 AM  Result Value Ref Range   Troponin I 0.33 (H) <0.031 ng/mL    Comment:        PERSISTENTLY INCREASED TROPONIN VALUES IN THE RANGE OF 0.04-0.49 ng/mL CAN BE SEEN IN:       -UNSTABLE ANGINA       -CONGESTIVE HEART FAILURE       -MYOCARDITIS       -CHEST TRAUMA       -ARRYHTHMIAS       -LATE PRESENTING MYOCARDIAL  INFARCTION       -COPD   CLINICAL FOLLOW-UP RECOMMENDED.    Dg Chest 2 View  04/11/2016  CLINICAL DATA:  Shortness of breath.  Chest pain for 2 days. EXAM: CHEST  2 VIEW COMPARISON:  03/11/2016 FINDINGS: Stable moderate cardiomegaly. AICD noted. This is unchanged imposition. No edema. Lingular subsegmental atelectasis. No pleural effusion. IVC filter noted. IMPRESSION: 1. Moderate cardiomegaly, stable. 2. New lingular subsegmental  atelectasis. Electronically Signed   By: Van Clines M.D.   On: 04/11/2016 12:05   Ct Angio Chest Pe W/cm &/or Wo Cm  04/12/2016  CLINICAL DATA:  Acute onset of pleuritic chest pain. Recently diagnosed with deep venous thrombosis at the left upper extremity. Initial encounter. EXAM: CT ANGIOGRAPHY CHEST WITH CONTRAST TECHNIQUE: Multidetector CT imaging of the chest was performed using the standard protocol during bolus administration of intravenous contrast. Multiplanar CT image reconstructions and MIPs were obtained to evaluate the vascular anatomy. CONTRAST:  80 mL of Isovue 370 IV contrast COMPARISON:  Chest radiograph performed 04/11/2016 FINDINGS: There is no evidence of pulmonary embolus. A trace right-sided pleural effusion is noted, with mild bibasilar atelectasis. The lungs are otherwise grossly clear. There is no evidence of pneumothorax. No masses are identified; no abnormal focal contrast enhancement is seen. The heart is significantly enlarged. A right-sided AICD is noted, with a single lead ending at the right ventricle. Trace pericardial fluid is noted. No mediastinal lymphadenopathy is seen. The great vessels are grossly unremarkable in appearance. No axillary lymphadenopathy is seen. The visualized portions of the thyroid gland are unremarkable in appearance. Small volume ascites is noted within the upper abdomen. The visualized portions of the liver and spleen are grossly unremarkable. The visualized portions of the gallbladder, pancreas and adrenal glands are within normal limits. Scattered vague hypodense and hyperdense cystic foci are seen within both kidneys, with mild bilateral renal atrophy and scarring. No acute osseous abnormalities are seen. Review of the MIP images confirms the above findings. IMPRESSION: 1. No evidence of pulmonary embolus. 2. Trace right-sided pleural effusion, with mild bibasilar atelectasis. 3. Significant cardiomegaly.  Trace pericardial fluid noted. 4.  Small volume ascites within the upper abdomen. 5. Mild bilateral renal atrophy and scarring, with scattered vague hypodense and hyperdense cystic foci in both kidneys. Electronically Signed   By: Garald Balding M.D.   On: 04/12/2016 01:11   US Venous Img Upper Uni Left  04/11/2016  CLINICAL DATA:  Patient was diagnosed with left internal jugular vein deep venous thrombosis on 04/01/2016, now presenting with worsening swelling and pain in the left arm. History of left brachiocephalic AV fistula, jump graft revision of left arm AV fistula and ligation of the aneurysmal fistula 03/22/2016. The patient is anticoagulated. EXAM: LEFT UPPER EXTREMITY VENOUS DOPPLER ULTRASOUND TECHNIQUE: Gray-scale sonography with graded compression, as well as color Doppler and duplex ultrasound were performed to evaluate the upper extremity deep venous system from the level of the subclavian vein and including the jugular, axillary, basilic, radial, ulnar and upper cephalic vein. Spectral Doppler was utilized to evaluate flow at rest and with distal augmentation maneuvers. COMPARISON:  None. FINDINGS: Contralateral Subclavian Vein: Respiratory phasicity is normal and symmetric with the symptomatic side. No evidence of thrombus. Normal compressibility. Internal Jugular Vein: There is an occlusive thrombus within long segment of the internal jugular vein, which demonstrates no compressibility. Subclavian Vein: No evidence of thrombus. Normal compressibility, respiratory phasicity and response to augmentation. Axillary Vein: No evidence of thrombus. Normal compressibility, respiratory phasicity and  response to augmentation. Cephalic Vein: No evidence of thrombus. Normal compressibility, respiratory phasicity and response to augmentation. Basilic Vein: Not visualized. Brachial Veins: Limited visualization. Radial Veins: No evidence of thrombus. Normal compressibility, respiratory phasicity and response to augmentation. Ulnar Veins: No  evidence of thrombus. Normal compressibility, respiratory phasicity and response to augmentation. Venous Reflux:  None visualized. Other Findings: O occluded left av fistula, post ligation is seen in the upper arm. A mid upper arm jump graft is patent and measures 9 mm in cross-section. Left upper arm subcutaneous soft tissue edema is seen. IMPRESSION: Occlusive thrombus within long segment of the internal jugular vein to the level of the takeoff of the subclavian vein. Patent mid left upper arm jump graft measuring 9 mm in cross-section. Soft tissue edema. Electronically Signed   By: Fidela Salisbury M.D.   On: 04/11/2016 13:36    Assessment:  1. ESRD 2. Right fem cath, s/p new LUE AV access 3.IJ DVT, hx of DVT and PE on Eliquis  Plan: 1 Hemodialysis today.  Aryka Coonradt C 04/12/2016, 3:48 PM

## 2016-04-12 NOTE — Progress Notes (Signed)
Text paged Dr. Merdis Delay re patient's c/o pain not being relieved by 0.5mg  IV Dilaudid and that she wanted to see if they would increase her dose, Md gave an order for Percocet, VSS and no other changes from previous, if the IV Dilaudid doesn't work within the hour, will get an EKG and call Cardiology to make sure it's not of a cardiac nature.

## 2016-04-12 NOTE — Care Management Important Message (Signed)
Important Message  Patient Details  Name: [name] Small MRN: [id number] Date of Birth: [birth year]   Medicare Important Message Given:  Yes    Bernadette Hoit 04/12/2016, 1:48 PM

## 2016-04-12 NOTE — Progress Notes (Signed)
Updated report received in patient's room via Rey RN, reviewed events of the day and POC. Patient just returned from dialysis and they were able to take off 4 liters of fluid, assumed care of patient.

## 2016-04-12 NOTE — Progress Notes (Signed)
*  PRELIMINARY RESULTS* Echocardiogram 2D Echocardiogram has been performed.  [name] Small 04/12/2016, 10:21 AM

## 2016-04-13 ENCOUNTER — Ambulatory Visit (HOSPITAL_COMMUNITY): Payer: Medicare Other

## 2016-04-13 DIAGNOSIS — Z7901 Long term (current) use of anticoagulants: Secondary | ICD-10-CM

## 2016-04-13 DIAGNOSIS — R071 Chest pain on breathing: Secondary | ICD-10-CM

## 2016-04-13 LAB — COMPREHENSIVE METABOLIC PANEL
ALBUMIN: 3.3 g/dL — AB (ref 3.5–5.0)
ALT: 7 U/L — ABNORMAL LOW (ref 14–54)
ANION GAP: 15 (ref 5–15)
AST: 24 U/L (ref 15–41)
Alkaline Phosphatase: 316 U/L — ABNORMAL HIGH (ref 38–126)
BILIRUBIN TOTAL: 0.7 mg/dL (ref 0.3–1.2)
BUN: 58 mg/dL — ABNORMAL HIGH (ref 6–20)
CHLORIDE: 98 mmol/L — AB (ref 101–111)
CO2: 22 mmol/L (ref 22–32)
Calcium: 10 mg/dL (ref 8.9–10.3)
Creatinine, Ser: 10.24 mg/dL — ABNORMAL HIGH (ref 0.44–1.00)
GFR calc Af Amer: 5 mL/min — ABNORMAL LOW (ref >60)
GFR calc non Af Amer: 5 mL/min — ABNORMAL LOW (ref >60)
GLUCOSE: 83 mg/dL (ref 65–99)
POTASSIUM: 4 mmol/L (ref 3.5–5.1)
SODIUM: 135 mmol/L (ref 135–145)
Total Protein: 8 g/dL (ref 6.5–8.1)

## 2016-04-13 LAB — CBC
HCT: 32.8 % — ABNORMAL LOW (ref 36.0–46.0)
Hemoglobin: 10.1 g/dL — ABNORMAL LOW (ref 12.0–15.0)
MCH: 28.2 pg (ref 26.0–34.0)
MCHC: 30.8 g/dL (ref 30.0–36.0)
MCV: 91.6 fL (ref 78.0–100.0)
PLATELETS: 231 10*3/uL (ref 150–400)
RBC: 3.58 MIL/uL — ABNORMAL LOW (ref 3.87–5.11)
RDW: 19.3 % — AB (ref 11.5–15.5)
WBC: 8 10*3/uL (ref 4.0–10.5)

## 2016-04-13 MED ORDER — COLCHICINE 0.6 MG PO TABS
0.6000 mg | ORAL_TABLET | Freq: Two times a day (BID) | ORAL | Status: DC
  Administered 2016-04-13 (×2): 0.6 mg via ORAL
  Filled 2016-04-13 (×2): qty 1

## 2016-04-13 MED ORDER — DIPHENHYDRAMINE HCL 50 MG/ML IJ SOLN
INTRAMUSCULAR | Status: AC
  Filled 2016-04-13: qty 1

## 2016-04-13 NOTE — Progress Notes (Signed)
TELEMETRY: Reviewed telemetry pt in NSR: Filed Vitals:   04/12/16 2333 04/13/16 0450 04/13/16 0451 04/13/16 0533  BP: 117/90 162/109 162/109 140/79  Pulse: 95   99  Temp: 98 F (36.7 C)   97.5 F (36.4 C)  TempSrc: Oral   Oral  Resp: Height:      Weight:    125 lb 9.6 oz (56.972 kg)  SpO2: 97%   100%    Intake/Output Summary (Last 24 hours) at 04/13/16 1201 Last data filed at 04/13/16 1020  Gross per 24 hour  Intake    720 ml  Output   4500 ml  Net  -3780 ml   Filed Weights   04/12/16 1440 04/12/16 1810 04/13/16 0533  Weight: 134 lb 7.7 oz (61 kg) 124 lb 9 oz (56.5 kg) 125 lb 9.6 oz (56.972 kg)    Subjective Still complains of anterior pleuritic type pain worse with deep breath. Relieved with narcotics but returns 2 hours later.  Marland Kitchen alteplase  2 mg Intracatheter Once  . amLODipine  5 mg Oral Daily  . apixaban  5 mg Oral BID  . carvedilol  6.25 mg Oral BID WC  . cinacalcet  30 mg Oral Q supper  . colchicine  0.6 mg Oral BID  . hydrALAZINE  25 mg Oral TID  . isosorbide dinitrate  10 mg Oral TID  . multivitamin  1 tablet Oral Daily  . nicotine  14 mg Transdermal Daily  . sertraline  100 mg Oral Daily  . sevelamer carbonate  2,400 mg Oral TID WC  . sodium chloride flush  3 mL Intravenous Q12H      LABS: Basic Metabolic Panel:  Recent Labs  96/04/54 0445 04/13/16 0410  NA 137 135  K 4.7 4.0  CL 98* 98*  CO2 20* 22  GLUCOSE 148* 83  BUN 94* 58*  CREATININE 15.36* 10.24*  CALCIUM 8.7* 10.0   Liver Function Tests:  Recent Labs  04/12/16 0445 04/13/16 0410  AST 24 24  ALT 5* 7*  ALKPHOS 272* 316*  BILITOT 0.8 0.7  PROT 7.3 8.0  ALBUMIN 2.9* 3.3*   No results for input(s): LIPASE, AMYLASE in the last 72 hours. CBC:  Recent Labs  04/12/16 0445 04/13/16 0410  WBC 4.4 8.0  HGB 9.0* 10.1*  HCT 28.8* 32.8*  MCV 88.6 91.6  PLT 249 231   Cardiac Enzymes:  Recent Labs  04/11/16 2308 04/12/16 0445 04/12/16 1059  TROPONINI 0.46*  0.39* 0.33*   BNP: No results for input(s): PROBNP in the last 72 hours. D-Dimer: No results for input(s): DDIMER in the last 72 hours. Hemoglobin A1C: No results for input(s): HGBA1C in the last 72 hours. Fasting Lipid Panel: No results for input(s): CHOL, HDL, LDLCALC, TRIG, CHOLHDL, LDLDIRECT in the last 72 hours. Thyroid Function Tests: No results for input(s): TSH, T4TOTAL, T3FREE, THYROIDAB in the last 72 hours.  Invalid input(s): FREET3   Radiology/Studies:  Ct Angio Chest Pe W/cm &/or Wo Cm  04/12/2016  CLINICAL DATA:  Acute onset of pleuritic chest pain. Recently diagnosed with deep venous thrombosis at the left upper extremity. Initial encounter. EXAM: CT ANGIOGRAPHY CHEST WITH CONTRAST TECHNIQUE: Multidetector CT imaging of the chest was performed using the standard protocol during bolus administration of intravenous contrast. Multiplanar CT image reconstructions and MIPs were obtained to evaluate the vascular anatomy. CONTRAST:  80 mL of Isovue 370 IV contrast COMPARISON:  Chest radiograph performed 04/11/2016 FINDINGS: There is no  evidence of pulmonary embolus. A trace right-sided pleural effusion is noted, with mild bibasilar atelectasis. The lungs are otherwise grossly clear. There is no evidence of pneumothorax. No masses are identified; no abnormal focal contrast enhancement is seen. The heart is significantly enlarged. A right-sided AICD is noted, with a single lead ending at the right ventricle. Trace pericardial fluid is noted. No mediastinal lymphadenopathy is seen. The great vessels are grossly unremarkable in appearance. No axillary lymphadenopathy is seen. The visualized portions of the thyroid gland are unremarkable in appearance. Small volume ascites is noted within the upper abdomen. The visualized portions of the liver and spleen are grossly unremarkable. The visualized portions of the gallbladder, pancreas and adrenal glands are within normal limits. Scattered vague  hypodense and hyperdense cystic foci are seen within both kidneys, with mild bilateral renal atrophy and scarring. No acute osseous abnormalities are seen. Review of the MIP images confirms the above findings. IMPRESSION: 1. No evidence of pulmonary embolus. 2. Trace right-sided pleural effusion, with mild bibasilar atelectasis. 3. Significant cardiomegaly.  Trace pericardial fluid noted. 4. Small volume ascites within the upper abdomen. 5. Mild bilateral renal atrophy and scarring, with scattered vague hypodense and hyperdense cystic foci in both kidneys. Electronically Signed   By: Roanna Raider M.D.   On: 04/12/2016 01:11   US Venous Img Upper Uni Left  04/11/2016  CLINICAL DATA:  Patient was diagnosed with left internal jugular vein deep venous thrombosis on 04/01/2016, now presenting with worsening swelling and pain in the left arm. History of left brachiocephalic AV fistula, jump graft revision of left arm AV fistula and ligation of the aneurysmal fistula 03/22/2016. The patient is anticoagulated. EXAM: LEFT UPPER EXTREMITY VENOUS DOPPLER ULTRASOUND TECHNIQUE: Gray-scale sonography with graded compression, as well as color Doppler and duplex ultrasound were performed to evaluate the upper extremity deep venous system from the level of the subclavian vein and including the jugular, axillary, basilic, radial, ulnar and upper cephalic vein. Spectral Doppler was utilized to evaluate flow at rest and with distal augmentation maneuvers. COMPARISON:  None. FINDINGS: Contralateral Subclavian Vein: Respiratory phasicity is normal and symmetric with the symptomatic side. No evidence of thrombus. Normal compressibility. Internal Jugular Vein: There is an occlusive thrombus within long segment of the internal jugular vein, which demonstrates no compressibility. Subclavian Vein: No evidence of thrombus. Normal compressibility, respiratory phasicity and response to augmentation. Axillary Vein: No evidence of thrombus.  Normal compressibility, respiratory phasicity and response to augmentation. Cephalic Vein: No evidence of thrombus. Normal compressibility, respiratory phasicity and response to augmentation. Basilic Vein: Not visualized. Brachial Veins: Limited visualization. Radial Veins: No evidence of thrombus. Normal compressibility, respiratory phasicity and response to augmentation. Ulnar Veins: No evidence of thrombus. Normal compressibility, respiratory phasicity and response to augmentation. Venous Reflux:  None visualized. Other Findings: O occluded left av fistula, post ligation is seen in the upper arm. A mid upper arm jump graft is patent and measures 9 mm in cross-section. Left upper arm subcutaneous soft tissue edema is seen. IMPRESSION: Occlusive thrombus within long segment of the internal jugular vein to the level of the takeoff of the subclavian vein. Patent mid left upper arm jump graft measuring 9 mm in cross-section. Soft tissue edema. Electronically Signed   By: Ted Mcalpine M.D.   On: 04/11/2016 13:36    PHYSICAL EXAM General: chronically ill appearing, well nourished, female in no acute distress Head: Eyes PERRLA, No xanthomas. Normocephalic and atraumatic, oropharynx without edema or exudate.  Lungs: Resp regular and  unlabored, CTA. Heart: RRR Loud s3, no s4, rub,or murmurs..  Neck: No carotid bruits. No lymphadenopathy. JVD. Abdomen: Bowel sounds present, abdomen soft and non-tender without masses or hernias noted. Msk: No spine or cva tenderness. No weakness, no joint deformities or effusions. Extremities: No clubbing, cyanosis. 1+ BL LE edema. DP 2+ and equal bilaterally. LUE fistula. L arm swollen compared to right. right femoral vasc cath in place. Neuro: Alert and oriented X 3. No focal deficits noted. Psych: Good affect, responds appropriately Skin: No rashes or lesions noted.  ASSESSMENT AND PLAN: 1. Chest pain. Pleuritic in nature. No evidence of PE by CT. No Ecg  changes or rub but she does have a small posterior effusion on Echo. It may be worth giving her an empiric trial of colchicine. Will start 0.6 mg bid. Chronic troponin elevation is not consistent with ischemia 2. ESRD on dialysis 3. NICM EF 20%. Continue coreg, hydralazine, Imdur.   Present on Admission:  . Chest pain . Systolic CHF, chronic (HCC) . Failed kidney transplant . Elevated serum hCG . Anemia of chronic renal failure, stage 5 (HCC) . AICD (automatic cardioverter/defibrillator) present  Signed, Shadeed Colberg [place], MDFACC 04/13/2016 12:01 PM

## 2016-04-13 NOTE — Progress Notes (Signed)
PROGRESS NOTE    [name] Small  ZMO:[id number]  DOB: Feb 28, 1991  DOA: 04/11/2016 PCP: Jeanann Lewandowsky, MD Outpatient Specialists:   Hospital course: [name] Small is a 25 y.o. 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 She presented to the ED tonight for evaluation of left sided pleuritic chest pain. The pain occurs at rest. It was constant and stabbing in nature earlier today, which prompted presentation for further evaluation. She says that she typically has recurrent chest pain on the right side of her chest, but no on the left. This pain has not been associated with shortness of breath or syncope, but she has had light-headedness. No known history of MI or CAD. CTA chest negative for PE.  She does have a history of PEs in the past.    Assessment & Plan:   Pleuritic chest pain with abnormal troponin (in the context of ESRD) with accelerated HTN and recent diagnosis of LUE DVT. Differential certainly included PE (althoug the patient reports that she has been compliant with Eliquis as prescribed at discharge from [facility] earlier this month), pericarditis, and MI. --CTA chest negative for PE. Plan to continue anticoagulation with Eliquis.  --Pain improved after IV diluadid, analgesics as needed. --Serial troponin, repeat EKG in the AM --Complete echo done shows small post pericardial effusion --Incentive spirometer use encouraged --Aspirin not ordered due to reported allergy  LUE DVT - fairly recent, site of prior dialysis graft, patient now reporting spontaneous drainage from her left arm but there is no obvious wound dehiscence --The patient was evaluated by vascular  surgery at [employer]. It does not appear that she was considered to be a candidate for thrombolytics at that time.  ESRD on HD, she now has a right femoral permacath that has reportedly been in for approximately one month --Requesting nephrology consult for HD,  HD given on 6/21.   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  DVT prophylaxis: FULL anticoagulation Code Status: FULL Family Communication: patient updated at bedside Disposition Plan: Home today or tomorrow pending cardiology opionion Consults called: It appears that nephrology was called from the ED in [facility], but they will need to be called again in the morning to make sure she gets HD orders. Admission status: Inpatient, stepdown unit  Consultants:  Nephrology  Subjective: Pt says that she is still having chest pain.   Objective: Filed Vitals:   04/12/16 2333 04/13/16 0450 04/13/16 0451 04/13/16 0533  BP: 117/90 162/109 162/109 140/79  Pulse: 95   99  Temp: 98 F (36.7 C)   97.5 F (36.4 C)  TempSrc: Oral   Oral  Resp: 17 19  15   Height:      Weight:    125 lb 9.6 oz (56.972 kg)  SpO2: 97%   100%    Intake/Output Summary (Last 24 hours)  at 04/13/16 1023 Last data filed at 04/12/16 2200  Gross per 24 hour  Intake    240 ml  Output   4500 ml  Net  -4260 ml   Filed Weights   04/12/16 1440 04/12/16 1810 04/13/16 0533  Weight: 134 lb 7.7 oz (61 kg) 124 lb 9 oz (56.5 kg) 125 lb 9.6 oz (56.972 kg)    Exam:  Eyes:  PERRL, lids and conjunctivae normal, no scleral icterus. ENMT: Mucous membranes are moist. Posterior pharynx clear of any exudate or lesions. Normal dentition.  Neck: normal appearance, supple Respiratory: clear to auscultation bilaterally, no wheezing, no crackles. Normal respiratory effort. No accessory muscle use.  Cardiovascular: Tachycardic but regular. I did not hear a murmur or rub. She has 1+ pitting edema in bilateral lower extremities. 2+ pedal pulses.  GI: abdomen is soft and compressible. No distention. No tenderness. No masses palpated. Bowel sounds are present. Musculoskeletal: OLD LUE fistula noted, no wound dehiscence. Left arm obviously swollen compared to right. Otherwise, good ROM, no contractures. Normal muscle tone.  Skin: no rashes, warm and dry Neurologic: No focal deficits Psychiatric: Normal judgment and insight. Alert and oriented x 3. Normal mood but somewhat flat affect.  Data Reviewed: Basic Metabolic Panel:  Recent Labs Lab 04/11/16 1130 04/12/16 0445 04/13/16 0410  NA 137 137 135  K 4.2 4.7 4.0  CL 99* 98* 98*  CO2 20* 20* 22  GLUCOSE 82 148* 83  BUN 88* 94* 58*  CREATININE 14.58* 15.36* 10.24*  CALCIUM 9.6 8.7* 10.0   Liver Function Tests:  Recent Labs Lab 04/12/16 0445 04/13/16 0410  AST 24 24  ALT 5* 7*  ALKPHOS 272* 316*  BILITOT 0.8 0.7  PROT 7.3 8.0  ALBUMIN 2.9* 3.3*   No results for input(s): LIPASE, AMYLASE in the last 168 hours. No results for input(s): AMMONIA in the last 168 hours. CBC:  Recent Labs Lab 04/11/16 1130 04/12/16 0445 04/13/16 0410  WBC 5.9 4.4 8.0  HGB 9.5* 9.0* 10.1*  HCT 29.3* 28.8* 32.8*  MCV 91.0 88.6 91.6  PLT 264 249 231   Cardiac Enzymes:  Recent Labs Lab 04/11/16 1130 04/11/16 1450 04/11/16 2308 04/12/16 0445 04/12/16 1059  TROPONINI 0.54* 0.53* 0.46* 0.39* 0.33*   BNP (last 3 results) No results for input(s): PROBNP in the last 8760 hours. CBG: No results for input(s):  GLUCAP in the last 168 hours.  Recent Results (from the past 240 hour(s))  MRSA PCR Screening     Status: None   Collection Time: 04/11/16  6:27 PM  Result Value Ref Range Status   MRSA by PCR NEGATIVE NEGATIVE Final    Comment:        The GeneXpert MRSA Assay (FDA approved for NASAL specimens only), is one component of a comprehensive MRSA colonization surveillance program. It is not intended to diagnose MRSA infection nor to guide or monitor treatment for MRSA infections.     Studies: Dg Chest 2 View  04/11/2016  CLINICAL DATA:  Shortness of breath.  Chest pain for 2 days. EXAM: CHEST  2 VIEW COMPARISON:  03/11/2016 FINDINGS: Stable moderate cardiomegaly. AICD noted. This is unchanged imposition. No edema. Lingular subsegmental atelectasis. No pleural effusion. IVC filter noted. IMPRESSION: 1. Moderate cardiomegaly, stable. 2. New lingular subsegmental atelectasis. Electronically Signed   By: Gaylyn Rong M.D.   On: 04/11/2016 12:05   Ct Angio Chest Pe W/cm &/or Wo Cm  04/12/2016  CLINICAL DATA:  Acute onset of pleuritic chest pain.  Recently diagnosed with deep venous thrombosis at the left upper extremity. Initial encounter. EXAM: CT ANGIOGRAPHY CHEST WITH CONTRAST TECHNIQUE: Multidetector CT imaging of the chest was performed using the standard protocol during bolus administration of intravenous contrast. Multiplanar CT image reconstructions and MIPs were obtained to evaluate the vascular anatomy. CONTRAST:  80 mL of Isovue 370 IV contrast COMPARISON:  Chest radiograph performed 04/11/2016 FINDINGS: There is no evidence of pulmonary embolus. A trace right-sided pleural effusion is noted, with mild bibasilar atelectasis. The lungs are otherwise grossly clear. There is no evidence of pneumothorax. No masses are identified; no abnormal focal contrast enhancement is seen. The heart is significantly enlarged. A right-sided AICD is noted, with a single lead ending at the right ventricle.  Trace pericardial fluid is noted. No mediastinal lymphadenopathy is seen. The great vessels are grossly unremarkable in appearance. No axillary lymphadenopathy is seen. The visualized portions of the thyroid gland are unremarkable in appearance. Small volume ascites is noted within the upper abdomen. The visualized portions of the liver and spleen are grossly unremarkable. The visualized portions of the gallbladder, pancreas and adrenal glands are within normal limits. Scattered vague hypodense and hyperdense cystic foci are seen within both kidneys, with mild bilateral renal atrophy and scarring. No acute osseous abnormalities are seen. Review of the MIP images confirms the above findings. IMPRESSION: 1. No evidence of pulmonary embolus. 2. Trace right-sided pleural effusion, with mild bibasilar atelectasis. 3. Significant cardiomegaly.  Trace pericardial fluid noted. 4. Small volume ascites within the upper abdomen. 5. Mild bilateral renal atrophy and scarring, with scattered vague hypodense and hyperdense cystic foci in both kidneys. Electronically Signed   By: Roanna Raider M.D.   On: 04/12/2016 01:11   US Venous Img Upper Uni Left  04/11/2016  CLINICAL DATA:  Patient was diagnosed with left internal jugular vein deep venous thrombosis on 04/01/2016, now presenting with worsening swelling and pain in the left arm. History of left brachiocephalic AV fistula, jump graft revision of left arm AV fistula and ligation of the aneurysmal fistula 03/22/2016. The patient is anticoagulated. EXAM: LEFT UPPER EXTREMITY VENOUS DOPPLER ULTRASOUND TECHNIQUE: Gray-scale sonography with graded compression, as well as color Doppler and duplex ultrasound were performed to evaluate the upper extremity deep venous system from the level of the subclavian vein and including the jugular, axillary, basilic, radial, ulnar and upper cephalic vein. Spectral Doppler was utilized to evaluate flow at rest and with distal augmentation  maneuvers. COMPARISON:  None. FINDINGS: Contralateral Subclavian Vein: Respiratory phasicity is normal and symmetric with the symptomatic side. No evidence of thrombus. Normal compressibility. Internal Jugular Vein: There is an occlusive thrombus within long segment of the internal jugular vein, which demonstrates no compressibility. Subclavian Vein: No evidence of thrombus. Normal compressibility, respiratory phasicity and response to augmentation. Axillary Vein: No evidence of thrombus. Normal compressibility, respiratory phasicity and response to augmentation. Cephalic Vein: No evidence of thrombus. Normal compressibility, respiratory phasicity and response to augmentation. Basilic Vein: Not visualized. Brachial Veins: Limited visualization. Radial Veins: No evidence of thrombus. Normal compressibility, respiratory phasicity and response to augmentation. Ulnar Veins: No evidence of thrombus. Normal compressibility, respiratory phasicity and response to augmentation. Venous Reflux:  None visualized. Other Findings: O occluded left av fistula, post ligation is seen in the upper arm. A mid upper arm jump graft is patent and measures 9 mm in cross-section. Left upper arm subcutaneous soft tissue edema is seen. IMPRESSION: Occlusive thrombus within long segment of the internal jugular vein to the level  of the takeoff of the subclavian vein. Patent mid left upper arm jump graft measuring 9 mm in cross-section. Soft tissue edema. Electronically Signed   By: Ted Mcalpine M.D.   On: 04/11/2016 13:36    Scheduled Meds: . alteplase  2 mg Intracatheter Once  . amLODipine  5 mg Oral Daily  . apixaban  5 mg Oral BID  . carvedilol  6.25 mg Oral BID WC  . cinacalcet  30 mg Oral Q supper  . hydrALAZINE  25 mg Oral TID  . isosorbide dinitrate  10 mg Oral TID  . multivitamin  1 tablet Oral Daily  . nicotine  14 mg Transdermal Daily  . sertraline  100 mg Oral Daily  . sevelamer carbonate  2,400 mg Oral TID WC   . sodium chloride flush  3 mL Intravenous Q12H   Continuous Infusions:   Principal Problem:   Chest pain Active Problems:   ESRD (end stage renal disease) on dialysis (HCC)   Systolic CHF, chronic (HCC)   Anemia of chronic renal failure, stage 5 (HCC)   Elevated serum hCG   AICD (automatic cardioverter/defibrillator) present   Failed kidney transplant   Hypertensive urgency   DVT (deep venous thrombosis), left   Chronic anticoagulation  Time spent:   Standley Dakins, MD, FAAFP [facility] Pager [phone]  If 7PM-7AM, please contact night-coverage [email or web address] Password [facility] 04/13/2016, 10:23 AM    LOS: 2 days

## 2016-04-13 NOTE — Progress Notes (Signed)
Assessment:  1. ESRD w/ volume overload 2. Right fem cath, s/p new LUE AV access 3.IJ DVT, hx of DVT and PE on Eliquis 4 Chest Pain  Plan: 1 Hemodialysis today again for volume.  Subjective: Interval History: Removed 4500cc with HD yesterday  Objective: Vital signs in last 24 hours: Temp:  [97.5 F (36.4 C)-98 F (36.7 C)] 97.5 F (36.4 C) (06/22 0533) Pulse Rate:  [90-99] 99 (06/22 0533) Resp:  [12-19] 15 (06/22 0533) BP: (114-162)/(77-109) 140/79 mmHg (06/22 0533) SpO2:  [97 %-100 %] 100 % (06/22 0533) Weight:  [56.5 kg (124 lb 9 oz)-61 kg (134 lb 7.7 oz)] 56.972 kg (125 lb 9.6 oz) (06/22 0533) Weight change: 3.847 kg (8 lb 7.7 oz)  Intake/Output from previous day: 06/21 0701 - 06/22 0700 In: 480 [P.O.:480] Out: 4500  Intake/Output this shift: Total I/O In: 480 [P.O.:480] Out: -   General appearance: alert and cooperative Head: Normocephalic, without obvious abnormality, atraumatic, round Back: presacral edema 1+ Resp: clear to auscultation bilaterally Cardio: regular rate and rhythm, S1, S2 normal, no murmur, click, rub or gallop Extremities: edema 2+ on right , tr on left, right groin cath  Lab Results:  Recent Labs  04/12/16 0445 04/13/16 0410  WBC 4.4 8.0  HGB 9.0* 10.1*  HCT 28.8* 32.8*  PLT 249 231   BMET:  Recent Labs  04/12/16 0445 04/13/16 0410  NA 137 135  K 4.7 4.0  CL 98* 98*  CO2 20* 22  GLUCOSE 148* 83  BUN 94* 58*  CREATININE 15.36* 10.24*  CALCIUM 8.7* 10.0   No results for input(s): PTH in the last 72 hours. Iron Studies: No results for input(s): IRON, TIBC, TRANSFERRIN, FERRITIN in the last 72 hours. Studies/Results: Ct Angio Chest Pe W/cm &/or Wo Cm  04/12/2016  CLINICAL DATA:  Acute onset of pleuritic chest pain. Recently diagnosed with deep venous thrombosis at the left upper extremity. Initial encounter. EXAM: CT ANGIOGRAPHY CHEST WITH CONTRAST TECHNIQUE: Multidetector CT imaging of the chest was performed using the  standard protocol during bolus administration of intravenous contrast. Multiplanar CT image reconstructions and MIPs were obtained to evaluate the vascular anatomy. CONTRAST:  80 mL of Isovue 370 IV contrast COMPARISON:  Chest radiograph performed 04/11/2016 FINDINGS: There is no evidence of pulmonary embolus. A trace right-sided pleural effusion is noted, with mild bibasilar atelectasis. The lungs are otherwise grossly clear. There is no evidence of pneumothorax. No masses are identified; no abnormal focal contrast enhancement is seen. The heart is significantly enlarged. A right-sided AICD is noted, with a single lead ending at the right ventricle. Trace pericardial fluid is noted. No mediastinal lymphadenopathy is seen. The great vessels are grossly unremarkable in appearance. No axillary lymphadenopathy is seen. The visualized portions of the thyroid gland are unremarkable in appearance. Small volume ascites is noted within the upper abdomen. The visualized portions of the liver and spleen are grossly unremarkable. The visualized portions of the gallbladder, pancreas and adrenal glands are within normal limits. Scattered vague hypodense and hyperdense cystic foci are seen within both kidneys, with mild bilateral renal atrophy and scarring. No acute osseous abnormalities are seen. Review of the MIP images confirms the above findings. IMPRESSION: 1. No evidence of pulmonary embolus. 2. Trace right-sided pleural effusion, with mild bibasilar atelectasis. 3. Significant cardiomegaly.  Trace pericardial fluid noted. 4. Small volume ascites within the upper abdomen. 5. Mild bilateral renal atrophy and scarring, with scattered vague hypodense and hyperdense cystic foci in both kidneys. Electronically Signed  By: Roanna Raider M.D.   On: 04/12/2016 01:11   US Venous Img Upper Uni Left  04/11/2016  CLINICAL DATA:  Patient was diagnosed with left internal jugular vein deep venous thrombosis on 04/01/2016, now  presenting with worsening swelling and pain in the left arm. History of left brachiocephalic AV fistula, jump graft revision of left arm AV fistula and ligation of the aneurysmal fistula 03/22/2016. The patient is anticoagulated. EXAM: LEFT UPPER EXTREMITY VENOUS DOPPLER ULTRASOUND TECHNIQUE: Gray-scale sonography with graded compression, as well as color Doppler and duplex ultrasound were performed to evaluate the upper extremity deep venous system from the level of the subclavian vein and including the jugular, axillary, basilic, radial, ulnar and upper cephalic vein. Spectral Doppler was utilized to evaluate flow at rest and with distal augmentation maneuvers. COMPARISON:  None. FINDINGS: Contralateral Subclavian Vein: Respiratory phasicity is normal and symmetric with the symptomatic side. No evidence of thrombus. Normal compressibility. Internal Jugular Vein: There is an occlusive thrombus within long segment of the internal jugular vein, which demonstrates no compressibility. Subclavian Vein: No evidence of thrombus. Normal compressibility, respiratory phasicity and response to augmentation. Axillary Vein: No evidence of thrombus. Normal compressibility, respiratory phasicity and response to augmentation. Cephalic Vein: No evidence of thrombus. Normal compressibility, respiratory phasicity and response to augmentation. Basilic Vein: Not visualized. Brachial Veins: Limited visualization. Radial Veins: No evidence of thrombus. Normal compressibility, respiratory phasicity and response to augmentation. Ulnar Veins: No evidence of thrombus. Normal compressibility, respiratory phasicity and response to augmentation. Venous Reflux:  None visualized. Other Findings: O occluded left av fistula, post ligation is seen in the upper arm. A mid upper arm jump graft is patent and measures 9 mm in cross-section. Left upper arm subcutaneous soft tissue edema is seen. IMPRESSION: Occlusive thrombus within long segment of the  internal jugular vein to the level of the takeoff of the subclavian vein. Patent mid left upper arm jump graft measuring 9 mm in cross-section. Soft tissue edema. Electronically Signed   By: Ted Mcalpine M.D.   On: 04/11/2016 13:36    Scheduled: . alteplase  2 mg Intracatheter Once  . amLODipine  5 mg Oral Daily  . apixaban  5 mg Oral BID  . carvedilol  6.25 mg Oral BID WC  . cinacalcet  30 mg Oral Q supper  . colchicine  0.6 mg Oral BID  . hydrALAZINE  25 mg Oral TID  . isosorbide dinitrate  10 mg Oral TID  . multivitamin  1 tablet Oral Daily  . nicotine  14 mg Transdermal Daily  . sertraline  100 mg Oral Daily  . sevelamer carbonate  2,400 mg Oral TID WC  . sodium chloride flush  3 mL Intravenous Q12H     LOS: 2 days   Krisinda Giovanni C 04/13/2016,12:45 PM

## 2016-04-13 NOTE — Progress Notes (Signed)
Updated report received in patient's room via [facility] RN, reviewed events of the day, new orders, VS, meds, labs, Dialysis took off 3 liters within 3 hours with no complications, assumed care of patient.

## 2016-04-13 NOTE — Procedures (Signed)
I was present at this session.  I have reviewed the session itself and made appropriate changes.  HD goal 3 L,  Vol xs.  bp 120s to 130s sys.   [name] Small L 6/22/20173:30 PM

## 2016-04-13 NOTE — Care Management Note (Addendum)
Case Management Note  Patient Details  Name: [name] Small MRN: [id number] Date of Birth: Mar 22, 1991  Subjective/Objective: Pt admitted for Pleuritic Chest Pain- Chronic systolic heart failure with evidence of volume overload. Pt has Hx of ESRD on dialysis. Pt states she uses the [facility] in [place] for medications.                     Action/Plan: Benefits Check in Process for colchicine tablet 0.6 mg. CM will make pt aware of cost once completed.  [facility] has medication in stock. No further needs from CM at this time. lchicine tablet 0.6 mg    Expected Discharge Date:                  Expected Discharge Plan:  Home/Self Care  In-House Referral:  NA  Discharge planning Services  CM Consult, Medication Assistance  Post Acute Care Choice:  NA Choice offered to:  NA  DME Arranged:  N/A DME Agency:  NA  HH Arranged:  NA HH Agency:  NA  Status of Service:  Completed, signed off  If discussed at Long Length of Stay Meetings, dates discussed:    Additional Comments: 1534 04-13-16 Tomi Bamberger, RN, BSN [phone] S/W KATHERINE @ [employer] # [phone]   COLCHICINE 0.6 MG BID ( 30 )   COVER- YES  CO-PAY- ZERO DOLLARS  TIER- 3 DRUG  PRIOR APPROVAL- NO  PHARMACY : ANY RETAIL  Gala Lewandowsky, RN 04/13/2016, 12:08 PM

## 2016-04-14 ENCOUNTER — Encounter (HOSPITAL_COMMUNITY): Payer: Self-pay | Admitting: Family Medicine

## 2016-04-14 ENCOUNTER — Inpatient Hospital Stay (HOSPITAL_COMMUNITY): Payer: Medicare Other

## 2016-04-14 DIAGNOSIS — I429 Cardiomyopathy, unspecified: Secondary | ICD-10-CM

## 2016-04-14 DIAGNOSIS — R609 Edema, unspecified: Secondary | ICD-10-CM

## 2016-04-14 DIAGNOSIS — I3139 Other pericardial effusion (noninflammatory): Secondary | ICD-10-CM | POA: Diagnosis present

## 2016-04-14 DIAGNOSIS — E8770 Fluid overload, unspecified: Secondary | ICD-10-CM

## 2016-04-14 DIAGNOSIS — G894 Chronic pain syndrome: Secondary | ICD-10-CM

## 2016-04-14 DIAGNOSIS — I313 Pericardial effusion (noninflammatory): Secondary | ICD-10-CM | POA: Diagnosis present

## 2016-04-14 DIAGNOSIS — R600 Localized edema: Secondary | ICD-10-CM | POA: Diagnosis present

## 2016-04-14 DIAGNOSIS — R7989 Other specified abnormal findings of blood chemistry: Secondary | ICD-10-CM

## 2016-04-14 DIAGNOSIS — I428 Other cardiomyopathies: Secondary | ICD-10-CM

## 2016-04-14 DIAGNOSIS — N185 Chronic kidney disease, stage 5: Secondary | ICD-10-CM

## 2016-04-14 DIAGNOSIS — D631 Anemia in chronic kidney disease: Secondary | ICD-10-CM

## 2016-04-14 DIAGNOSIS — I319 Disease of pericardium, unspecified: Secondary | ICD-10-CM

## 2016-04-14 DIAGNOSIS — I82C12 Acute embolism and thrombosis of left internal jugular vein: Secondary | ICD-10-CM

## 2016-04-14 DIAGNOSIS — I82C19 Acute embolism and thrombosis of unspecified internal jugular vein: Secondary | ICD-10-CM | POA: Diagnosis present

## 2016-04-14 LAB — RENAL FUNCTION PANEL
ANION GAP: 14 (ref 5–15)
Albumin: 3.2 g/dL — ABNORMAL LOW (ref 3.5–5.0)
BUN: 40 mg/dL — ABNORMAL HIGH (ref 6–20)
CHLORIDE: 97 mmol/L — AB (ref 101–111)
CO2: 24 mmol/L (ref 22–32)
Calcium: 10.8 mg/dL — ABNORMAL HIGH (ref 8.9–10.3)
Creatinine, Ser: 7.43 mg/dL — ABNORMAL HIGH (ref 0.44–1.00)
GFR calc non Af Amer: 7 mL/min — ABNORMAL LOW (ref >60)
GFR, EST AFRICAN AMERICAN: 8 mL/min — AB (ref >60)
Glucose, Bld: 76 mg/dL (ref 65–99)
Phosphorus: 7.9 mg/dL — ABNORMAL HIGH (ref 2.5–4.6)
Potassium: 4.1 mmol/L (ref 3.5–5.1)
Sodium: 135 mmol/L (ref 135–145)

## 2016-04-14 MED ORDER — HYDROMORPHONE HCL 1 MG/ML IJ SOLN
INTRAMUSCULAR | Status: AC
  Administered 2016-04-14: 17:00:00
  Filled 2016-04-14: qty 1

## 2016-04-14 MED ORDER — DIPHENHYDRAMINE HCL 25 MG PO CAPS: 25.0000 mg | ORAL_CAPSULE | Freq: Once | ORAL | Status: DC

## 2016-04-14 MED ORDER — COLCHICINE 0.6 MG PO TABS: 0.3000 mg | ORAL_TABLET | Freq: Every day | ORAL | Status: DC

## 2016-04-14 NOTE — Progress Notes (Signed)
Pt came back from hemo in stable condition. She picked and chose which meds she was willing to take at that time. See MAR for which meds she took and refused. Pts ride arrived at the front of the hospital and pt anxious to go home. D/c'd via wheelchair with tech to front of hospital. Discharge instructions reviewed and no further questions. When I reviewed heart failure instructions, pt denied weighing herself everyday. Education reinforced. D/c'd in stable condition

## 2016-04-14 NOTE — Progress Notes (Signed)
*  PRELIMINARY RESULTS* Vascular Ultrasound Lower extremity venous duplex has been completed.  Preliminary findings: No evidence of DVT or baker's cyst.   Farrel Demark, RDMS, RVT  04/14/2016, 11:06 AM

## 2016-04-14 NOTE — Discharge Summary (Signed)
Physician Discharge Summary  Nayanna Karam HKV:[id number] DOB: May 04, 1991 DOA: 04/11/2016  PCP: Jeanann Lewandowsky, MD / Julianne Handler, NP - sickle cell expert  Admit date: 04/11/2016 Discharge date: 04/14/2016  Recommendations for Outpatient Follow-up:  1. Follow up with PCP in 1-2 weeks 2. Resume regularly scheduled hemodialysis  Discharge Condition: stable but guarded  Brief/Interim Summary: Hospital course: [name] Small is a 25 y.o. 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 She presented to the ED tonight for evaluation of left sided pleuritic chest pain. The pain occurs at rest. It was constant and stabbing in nature earlier today, which prompted presentation for further evaluation. She says that she typically has recurrent chest pain on the right side of her chest, but no on the left. This pain has not been associated with shortness of breath or syncope, but she has had light-headedness. No known history of MI or CAD. CTA chest negative for PE. She does have a history of PEs in the past.   Assessment & Plan:  Pleuritic chest pain with chronically elevated troponin (in the context of ESRD) with accelerated HTN and recent diagnosis of LUE DVT. Differential certainly included PE (the patient reports that she has been compliant with Eliquis as prescribed at discharge from [facility] earlier this month but has long history of poor compliance), pericarditis, and MI. Small pericardial effusion seen on Echo. Started on colchicine 6/22 by cardiology. Chest pain seemed to get better with hemodialysis, excess fluid removal and colchicine. The colchicine was not continued because the  pharmacist was worried that it would be unsafe for her to continue using it for any extended period of time.  Colchicine is not removed with hemodialysis and given patient's history of poor compliance with follow up, etc. It was not felt safe to discharge her on colchicine.  --CTA chest negative for PE. Plan to continue long-term anticoagulation with Eliquis.  --Pain improved per patient.  --Serial troponin basically flat and unchanged, repeat EKG no acute findings --Complete echo done shows small post pericardial effusion --Incentive spirometer use encouraged --Aspirin not ordered due to reported allergy  Chronic pain syndrome - The patient was asking for narcotic prescription at discharge but it was documented that she received an outpatient prescription for oxycodone on 6/15 through primary care and therefore did not give her any additional narcotic prescription.  Also, patient had been referred to pain management clinic by primary care provider.   LUE DVT - fairly recent, site of prior dialysis graft, patient now reporting spontaneous drainage from her left arm but there is no obvious wound dehiscence --The patient was evaluated by vascular surgery at [place]. It does not appear that she was considered to be a candidate for thrombolytics at that time.  Pt had bilateral LE [place] that was negative for DVT.  ESRD on HD, she now has a right femoral permacath that has reportedly been in for approximately one month --Requesting nephrology consult for HD, HD given on 6/21, 6/22 and 6/23. More than 6L of fluid was removed.  She was advised to resume regular outpatient dialysis schedule at discharge.  Nephrology noted that she may need a new EDW as she still had some edema after 3 successive hemodialysis sessions in the hospital.  See  notes.    Tobacco Abuse - Pt was caught smoking in bathroom, she was counseled and provided with a nicotine patch, pt did not seem interested in quitting at this  time.   Accelerated HTN --Resume home meds, blood pressure much better controlled with hemodialysis and removal of all the excess fluid  AICD in place --telemetry monitoring   Chronic systolic heart failure with evidence of volume overload --Dialysis 6/21 --She no longer makes urine --Cardiology consultation and following --Complete echo done, see report, EF only 20%.    Chronically elevated beta HCG of unclear significance  Recurrent VTE (prior bilateral PE) --She is a candidate for life long anticoagulation at this point, unless there is some contraindication.  Pt is HIGH RISK for readmission given her history of poor compliance, complex and fragile medical condition and chronic opioid dependence.    DVT prophylaxis: FULL anticoagulation Code Status: FULL Family Communication: patient updated at bedside Disposition Plan: Home today   Consultants:  Nephrology  Cardiology  Discharge Diagnoses:  Principal Problem:   Chest pain Active Problems:   ESRD (end stage renal disease) on dialysis (HCC)   Systolic CHF, chronic (HCC)   Hypertensive urgency   DVT (deep venous thrombosis), left   Chronic anticoagulation   Volume overload   Peripheral edema   NICM (nonischemic cardiomyopathy) (HCC)   Internal jugular (IJ) vein thromboembolism, acute (HCC)   Chronic pain syndrome   Pericardial effusion   Chronically Elevated troponin   Anemia of chronic renal failure, stage 5 (HCC)   Elevated serum hCG   AICD (automatic cardioverter/defibrillator) present   Failed kidney transplant  Discharge Instructions     Discharge Instructions    Increase activity slowly    Complete by:  As directed             Medication List    STOP taking these medications        diclofenac sodium 1 % Gel  Commonly known as:  VOLTAREN      TAKE these medications        amLODipine 5 MG tablet  Commonly known as:  NORVASC  Take 1 tablet (5 mg total) by mouth daily.     apixaban 5  MG Tabs tablet  Commonly known as:  ELIQUIS  Take 1 tablet (5 mg total) by mouth 2 (two) times daily. From 04/09/16     carvedilol 6.25 MG tablet  Commonly known as:  COREG  Take 6.25 mg by mouth 2 (two) times daily with a meal.     cinacalcet 30 MG tablet  Commonly known as:  SENSIPAR  Take 1 tablet (30 mg total) by mouth daily with breakfast.     hydrALAZINE 25 MG tablet  Commonly known as:  APRESOLINE  Take 1 tablet (25 mg total) by mouth 3 (three) times daily.     isosorbide dinitrate 10 MG tablet  Commonly known as:  ISORDIL  Take 1 tablet (10 mg total) by mouth 3 (three) times daily.     LORazepam 1 MG tablet  Commonly known as:  ATIVAN  Take 1 tablet (1 mg total) by mouth daily as needed for anxiety.     multivitamin Tabs tablet  Take 1 tablet by mouth daily.     oxyCODONE 5 MG immediate release tablet  Commonly known as:  Oxy IR/ROXICODONE  Take 1 tablet (5 mg total) by mouth every 6 (six) hours as needed for severe pain.     sertraline 100 MG tablet  Commonly  known as:  ZOLOFT  Take 1 tablet (100 mg total) by mouth daily.     sevelamer carbonate 800 MG tablet  Commonly known as:  RENVELA  Take 2-3 tablets (1,600-2,400 mg total) by mouth 3 (three) times daily with meals. 3 tabs with meals, 2 tabs with snacks       Follow-up Information    Follow up with Massie Maroon, FNP. Schedule an appointment as soon as possible for a visit in 1 week.   Specialty:  Family Medicine   Why:  Hospital Follow UP   Contact information:   [address] [place] [address] [phone]       Follow up with [facility]. Go in 3 days.   Why:  Scheduled regular Hemodialysis session      Follow up with [facility]. Schedule an appointment as soon as possible for a visit in 1 week.   Why:  Hospital Follow Up   Contact information:   [address] [place] [address] [phone]      Allergies  Allergen Reactions  . Aspirin  Other (See Comments)    Interacts with Coreg  . Tramadol Anaphylaxis  . Vicodin [Hydrocodone-Acetaminophen] Hives  . Buprenorphine Hcl Itching and Other (See Comments)    Ok with oxycodone  . Iohexol Itching  . Morphine And Related Itching and Other (See Comments)    Ok with oxycodone   Consultations:  Cardiology ([place])  Procedures/Studies: Dg Chest 2 View  04/11/2016  CLINICAL DATA:  Shortness of breath.  Chest pain for 2 days. EXAM: CHEST  2 VIEW COMPARISON:  03/11/2016 FINDINGS: Stable moderate cardiomegaly. AICD noted. This is unchanged imposition. No edema. Lingular subsegmental atelectasis. No pleural effusion. IVC filter noted. IMPRESSION: 1. Moderate cardiomegaly, stable. 2. New lingular subsegmental atelectasis. Electronically Signed   By: Gaylyn Rong M.D.   On: 04/11/2016 12:05   Ct Angio Chest Pe W/cm &/or Wo Cm  04/12/2016  CLINICAL DATA:  Acute onset of pleuritic chest pain. Recently diagnosed with deep venous thrombosis at the left upper extremity. Initial encounter. EXAM: CT ANGIOGRAPHY CHEST WITH CONTRAST TECHNIQUE: Multidetector CT imaging of the chest was performed using the standard protocol during bolus administration of intravenous contrast. Multiplanar CT image reconstructions and MIPs were obtained to evaluate the vascular anatomy. CONTRAST:  80 mL of Isovue 370 IV contrast COMPARISON:  Chest radiograph performed 04/11/2016 FINDINGS: There is no evidence of pulmonary embolus. A trace right-sided pleural effusion is noted, with mild bibasilar atelectasis. The lungs are otherwise grossly clear. There is no evidence of pneumothorax. No masses are identified; no abnormal focal contrast enhancement is seen. The heart is significantly enlarged. A right-sided AICD is noted, with a single lead ending at the right ventricle. Trace pericardial fluid is noted. No mediastinal lymphadenopathy is seen. The great vessels are grossly unremarkable in appearance. No axillary  lymphadenopathy is seen. The visualized portions of the thyroid gland are unremarkable in appearance. Small volume ascites is noted within the upper abdomen. The visualized portions of the liver and spleen are grossly unremarkable. The visualized portions of the gallbladder, pancreas and adrenal glands are within normal limits. Scattered vague hypodense and hyperdense cystic foci are seen within both kidneys, with mild bilateral renal atrophy and scarring. No acute osseous abnormalities are seen. Review of the MIP images confirms the above findings. IMPRESSION: 1. No evidence of pulmonary embolus. 2. Trace right-sided pleural effusion, with mild bibasilar atelectasis. 3. Significant cardiomegaly.  Trace pericardial fluid  noted. 4. Small volume ascites within the upper abdomen. 5. Mild bilateral renal atrophy and scarring, with scattered vague hypodense and hyperdense cystic foci in both kidneys. Electronically Signed   By: Roanna Raider M.D.   On: 04/12/2016 01:11   US Venous Img Upper Uni Left  04/11/2016  CLINICAL DATA:  Patient was diagnosed with left internal jugular vein deep venous thrombosis on 04/01/2016, now presenting with worsening swelling and pain in the left arm. History of left brachiocephalic AV fistula, jump graft revision of left arm AV fistula and ligation of the aneurysmal fistula 03/22/2016. The patient is anticoagulated. EXAM: LEFT UPPER EXTREMITY VENOUS DOPPLER ULTRASOUND TECHNIQUE: Gray-scale sonography with graded compression, as well as color Doppler and duplex ultrasound were performed to evaluate the upper extremity deep venous system from the level of the subclavian vein and including the jugular, axillary, basilic, radial, ulnar and upper cephalic vein. Spectral Doppler was utilized to evaluate flow at rest and with distal augmentation maneuvers. COMPARISON:  None. FINDINGS: Contralateral Subclavian Vein: Respiratory phasicity is normal and symmetric with the symptomatic side. No  evidence of thrombus. Normal compressibility. Internal Jugular Vein: There is an occlusive thrombus within long segment of the internal jugular vein, which demonstrates no compressibility. Subclavian Vein: No evidence of thrombus. Normal compressibility, respiratory phasicity and response to augmentation. Axillary Vein: No evidence of thrombus. Normal compressibility, respiratory phasicity and response to augmentation. Cephalic Vein: No evidence of thrombus. Normal compressibility, respiratory phasicity and response to augmentation. Basilic Vein: Not visualized. Brachial Veins: Limited visualization. Radial Veins: No evidence of thrombus. Normal compressibility, respiratory phasicity and response to augmentation. Ulnar Veins: No evidence of thrombus. Normal compressibility, respiratory phasicity and response to augmentation. Venous Reflux:  None visualized. Other Findings: O occluded left av fistula, post ligation is seen in the upper arm. A mid upper arm jump graft is patent and measures 9 mm in cross-section. Left upper arm subcutaneous soft tissue edema is seen. IMPRESSION: Occlusive thrombus within long segment of the internal jugular vein to the level of the takeoff of the subclavian vein. Patent mid left upper arm jump graft measuring 9 mm in cross-section. Soft tissue edema. Electronically Signed   By: Ted Mcalpine M.D.   On: 04/11/2016 13:36   US Venous Img Upper Uni Left  04/01/2016  ADDENDUM REPORT: 04/01/2016 19:38 ADDENDUM: Dr. Delton Coombes confirmed this patient is on her service at 7:37pm. At 7:38, I also gave these results to Dr. Wyn Quaker. Electronically Signed   By: Esperanza Heir M.D.   On: 04/01/2016 19:38  04/01/2016  ADDENDUM REPORT: 04/01/2016 19:33 ADDENDUM: Critical Value/emergent results were called by telephone at the time of interpretation on 04/01/2016 at 7:32 pm to the on call hospitalist, who verbally acknowledged these results. However, we are also attempting to contact the ordering  physician. Electronically Signed   By: Esperanza Heir M.D.   On: 04/01/2016 19:33  04/01/2016  CLINICAL DATA:  Left arm swelling for six days EXAM: LEFT UPPER EXTREMITY VENOUS DOPPLER ULTRASOUND TECHNIQUE: Gray-scale sonography with graded compression, as well as color Doppler and duplex ultrasound were performed to evaluate the upper extremity deep venous system from the level of the subclavian vein and including the jugular, axillary, basilic, radial, ulnar and upper cephalic vein. Spectral Doppler was utilized to evaluate flow at rest and with distal augmentation maneuvers. COMPARISON:  None. FINDINGS: Contralateral Subclavian Vein: Respiratory phasicity is normal and symmetric with the symptomatic side. No evidence of thrombus. Normal compressibility. Internal Jugular Vein: There is extensive thrombus,  which appears occlusive. The vein is not compressible. Subclavian Vein: No evidence of thrombus. Normal compressibility, respiratory phasicity and response to augmentation. Axillary Vein: No evidence of thrombus. Normal compressibility, respiratory phasicity and response to augmentation. Cephalic Vein: No evidence of thrombus. Normal compressibility, respiratory phasicity and response to augmentation. Basilic Vein: No evidence of thrombus. Normal compressibility, respiratory phasicity and response to augmentation. Brachial Veins: No evidence of thrombus. Normal compressibility, respiratory phasicity and response to augmentation. Radial Veins: No evidence of thrombus. Normal compressibility, respiratory phasicity and response to augmentation. Ulnar Veins: No evidence of thrombus. Normal compressibility, respiratory phasicity and response to augmentation. Venous Reflux:  None visualized. Other Findings:  None visualized. IMPRESSION: Acute occlusive thrombus of the left internal jugular vein. Electronically Signed: By: Esperanza Heir M.D. On: 04/01/2016 19:21   Echocardiogram 04/12/16  LV EF:  20%  ------------------------------------------------------------------- Indications: Chest pain 786.51.  ------------------------------------------------------------------- History: PMH: Pulmonary Embolism, AICD. Former Smoker, ESRD. Angina pectoris. Congestive heart failure. Congestive heart failure.  ------------------------------------------------------------------- Study Conclusions  - Left ventricle: The estimated ejection fraction was 20%. Diffuse  hypokinesis. Doppler parameters are consistent with both elevated  ventricular end-diastolic filling pressure and elevated left  atrial filling pressure. - Mitral valve: There was mild regurgitation. - Left atrium: The atrium was moderately dilated. - Right atrium: The atrium was mildly dilated. - Atrial septum: No defect or patent foramen ovale was identified. - Pericardium, extracardiac: Small mostly posterior pericardial  effusion  Transthoracic echocardiography. M-mode, complete 2D, spectral Doppler, and color Doppler. Birthdate: Patient birthdate: 20-Oct-1991. Age: Patient is 25 yr old. Sex: Gender: female. BMI: 23.5 kg/m^2. Blood pressure: 134/86 Patient status: Inpatient. Study date: Study date: 04/12/2016. Study time: 09:46 AM. Location: Echo laboratory. ------------------------------------------------------------------ Left ventricle: The estimated ejection fraction was 20%. Diffuse hypokinesis. Doppler parameters are consistent with both elevated ventricular end-diastolic filling pressure and elevated left atrial filling pressure.   Subjective: Pt says she feels better, denies further chest pain, she is to have HD prior to discharge.   Discharge Exam: Filed Vitals:   04/14/16 1630 04/14/16 1700  BP: 150/85 147/83  Pulse: 96 92  Temp:    Resp: 18 26   Filed Vitals:   04/14/16 1530 04/14/16 1600 04/14/16 1630 04/14/16 1700  BP: 140/91 163/86 150/85 147/83  Pulse: 87 93  96 92  Temp:      TempSrc:      Resp: 16 17 18 26   Height:      Weight:      SpO2:        General: Pt is alert, awake, not in acute distress Cardiovascular: RRR, S1/S2 +, no rubs, no gallops Respiratory: CTA bilaterally, no wheezing, no rhonchi Abdominal: Soft, NT, ND, bowel sounds + Extremities: trace peripheral edema, no cyanosis, less edema LUE clot  The results of significant diagnostics from this hospitalization (including imaging, microbiology, ancillary and laboratory) are listed below for reference.     Microbiology: Recent Results (from the past 240 hour(s))  MRSA PCR Screening     Status: None   Collection Time: 04/11/16  6:27 PM  Result Value Ref Range Status   MRSA by PCR NEGATIVE NEGATIVE Final    Comment:        The GeneXpert MRSA Assay (FDA approved for NASAL specimens only), is one component of a comprehensive MRSA colonization surveillance program. It is not intended to diagnose MRSA infection nor to guide or monitor treatment for MRSA infections.      Labs: BNP (last 3 results)  Recent  Labs  06/29/15 1615 09/29/15 1242 04/11/16 1130  BNP >4500.0* 2492.9* >4500.0*   Basic Metabolic Panel:  Recent Labs Lab 04/11/16 1130 04/12/16 0445 04/13/16 0410 04/14/16 0735  NA 137 137 135 135  K 4.2 4.7 4.0 4.1  CL 99* 98* 98* 97*  CO2 20* 20* 22 24  GLUCOSE 82 148* 83 76  BUN 88* 94* 58* 40*  CREATININE 14.58* 15.36* 10.24* 7.43*  CALCIUM 9.6 8.7* 10.0 10.8*  PHOS  --   --   --  7.9*   Liver Function Tests:  Recent Labs Lab 04/12/16 0445 04/13/16 0410 04/14/16 0735  AST 24 24  --   ALT 5* 7*  --   ALKPHOS 272* 316*  --   BILITOT 0.8 0.7  --   PROT 7.3 8.0  --   ALBUMIN 2.9* 3.3* 3.2*   No results for input(s): LIPASE, AMYLASE in the last 168 hours. No results for input(s): AMMONIA in the last 168 hours. CBC:  Recent Labs Lab 04/11/16 1130 04/12/16 0445 04/13/16 0410  WBC 5.9 4.4 8.0  HGB 9.5* 9.0* 10.1*  HCT 29.3* 28.8*  32.8*  MCV 91.0 88.6 91.6  PLT 264 249 231   Cardiac Enzymes:  Recent Labs Lab 04/11/16 1130 04/11/16 1450 04/11/16 2308 04/12/16 0445 04/12/16 1059  TROPONINI 0.54* 0.53* 0.46* 0.39* 0.33*   BNP: Invalid input(s): POCBNP CBG: No results for input(s): GLUCAP in the last 168 hours. D-Dimer No results for input(s): DDIMER in the last 72 hours. Hgb A1c No results for input(s): HGBA1C in the last 72 hours. Lipid Profile No results for input(s): CHOL, HDL, LDLCALC, TRIG, CHOLHDL, LDLDIRECT in the last 72 hours. Thyroid function studies No results for input(s): TSH, T4TOTAL, T3FREE, THYROIDAB in the last 72 hours.  Invalid input(s): FREET3 Anemia work up No results for input(s): VITAMINB12, FOLATE, FERRITIN, TIBC, IRON, RETICCTPCT in the last 72 hours. Urinalysis    Component Value Date/Time   COLORURINE YELLOW 01/14/2013 1639   APPEARANCEUR CLOUDY* 01/14/2013 1639   LABSPEC 1.008 01/14/2013 1639   PHURINE 8.0 01/14/2013 1639   GLUCOSEU NEGATIVE 01/14/2013 1639   HGBUR SMALL* 01/14/2013 1639   BILIRUBINUR NEGATIVE 01/14/2013 1639   KETONESUR NEGATIVE 01/14/2013 1639   PROTEINUR 30* 01/14/2013 1639   UROBILINOGEN 0.2 01/14/2013 1639   NITRITE NEGATIVE 01/14/2013 1639   LEUKOCYTESUR MODERATE* 01/14/2013 1639   Sepsis Labs Invalid input(s): PROCALCITONIN,  WBC,  LACTICIDVEN Microbiology Recent Results (from the past 240 hour(s))  MRSA PCR Screening     Status: None   Collection Time: 04/11/16  6:27 PM  Result Value Ref Range Status   MRSA by PCR NEGATIVE NEGATIVE Final    Comment:        The GeneXpert MRSA Assay (FDA approved for NASAL specimens only), is one component of a comprehensive MRSA colonization surveillance program. It is not intended to diagnose MRSA infection nor to guide or monitor treatment for MRSA infections.    Time coordinating discharge: 34 minutes  SIGNED:  Standley Dakins, MD  [facility] 04/14/2016, 5:35 PM Pager   If  7PM-7AM, please contact night-coverage [email or web address] Password [email or web address]

## 2016-04-14 NOTE — Discharge Instructions (Signed)
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  Follow up with Julianne Handler and kidney specialist in next week for hospital follow up.     Information on my medicine - ELIQUIS (apixaban)  This medication education was reviewed with me or my healthcare representative as part of my discharge preparation.  Why was Eliquis prescribed for you? Eliquis was prescribed to treat blood clots that may have been found in the veins of your legs (deep vein thrombosis) or in your lungs (pulmonary embolism) and to reduce the risk of them occurring again.  What do You need to know about Eliquis ? The dose is ONE 5 mg tablet taken TWICE daily.  Eliquis may be taken with or without food.   Try to take the dose about the same time in the morning and in the evening. If you have difficulty swallowing the tablet whole please discuss with your pharmacist how to take the medication safely.  Take Eliquis exactly as prescribed and DO NOT stop taking Eliquis without talking to the doctor who prescribed the medication.  Stopping may increase your risk of developing a new blood clot.  Refill your prescription before you run out.  After discharge, you should have regular check-up appointments with your healthcare provider that is prescribing your Eliquis.    What do you do if you miss a dose? If a dose of ELIQUIS is not taken at the scheduled time, take it as soon as possible on the same day and twice-daily administration should be resumed. The dose should not be doubled to make up for a missed dose.  Important Safety Information A possible side effect of Eliquis is bleeding. You should call your healthcare provider right away if you experience any of the following: ? Bleeding from an injury or your nose that does not stop. ? Unusual colored urine (red or dark brown) or unusual colored stools (red or black). ? Unusual bruising for  unknown reasons. ? A serious fall or if you hit your head (even if there is no bleeding).  Some medicines may interact with Eliquis and might increase your risk of bleeding or clotting while on Eliquis. To help avoid this, consult your healthcare provider or pharmacist prior to using any new prescription or non-prescription medications, including herbals, vitamins, non-steroidal anti-inflammatory drugs (NSAIDs) and supplements.  This website has more information on Eliquis (apixaban): http://[email or web address].com/eliquis/home   Chest Wall Pain Chest wall pain is pain in or around the bones and muscles of your chest. Sometimes, an injury causes this pain. Sometimes, the cause may not be known. This pain may take several weeks or longer to get better. HOME CARE Pay attention to any changes in your symptoms. Take these actions to help with your pain:  Rest as told by your doctor.  Avoid activities that cause pain. Try not to use your chest, belly (abdominal), or side muscles to lift heavy things.  If directed, apply ice to the painful area:  Put ice in a plastic bag.  Place a towel between your skin and the bag.  Leave the ice on for 20 minutes, 2-3 times per day.  Take over-the-counter and prescription medicines only as told by your doctor.  Do not use tobacco products, including cigarettes, chewing tobacco, and e-cigarettes. If you need help quitting, ask your doctor.  Keep all follow-up visits as told by your doctor. This is important. GET HELP IF:  You have a fever.  Your chest pain gets worse.  You have new symptoms. GET HELP RIGHT AWAY IF:  You feel sick to your stomach (nauseous) or you throw up (vomit).  You feel sweaty or light-headed.  You have a cough with phlegm (sputum) or you cough up blood.  You are short of breath.   This information is not intended to replace advice given to you by your health care provider. Make sure you discuss any questions you have with  your health care provider.   Document Released: 03/27/2008 Document Revised: 06/30/2015 Document Reviewed: 01/04/2015 Elsevier Interactive Patient Education 2016 [employer]  Edema Edema is an abnormal buildup of fluids. It is more common in your legs and thighs. Painless swelling of the feet and ankles is more likely as a person ages. It also is common in looser skin, like around your eyes. HOME CARE   Keep the affected body part above the level of the heart while lying down.  Do not sit still or stand for a long time.  Do not put anything right under your knees when you lie down.  Do not wear tight clothes on your upper legs.  Exercise your legs to help the puffiness (swelling) go down.  Wear elastic bandages or support stockings as told by your doctor.  A low-salt diet may help lessen the puffiness.  Only take medicine as told by your doctor. GET HELP IF:  Treatment is not working.  You have heart, liver, or kidney disease and notice that your skin looks puffy or shiny.  You have puffiness in your legs that does not get better when you raise your legs.  You have sudden weight gain for no reason. GET HELP RIGHT AWAY IF:   You have shortness of breath or chest pain.  You cannot breathe when you lie down.  You have pain, redness, or warmth in the areas that are puffy.  You have heart, liver, or kidney disease and get edema all of a sudden.  You have a fever and your symptoms get worse all of a sudden. MAKE SURE YOU:   Understand these instructions.  Will watch your condition.  Will get help right away if you are not doing well or get worse.   This information is not intended to replace advice given to you by your health care provider. Make sure you discuss any questions you have with your health care provider.   Document Released: 03/27/2008 Document Revised: 10/14/2013 Document Reviewed: 08/01/2013 Elsevier Interactive Patient Education 2016 [employer]  Inc[employer]   Deep Vein Thrombosis A deep vein thrombosis (DVT) is a blood clot (thrombus) that usually occurs in a deep, larger vein of the lower leg or the pelvis, or in an upper extremity such as the arm. These are dangerous and can lead to serious and even life-threatening complications if the clot travels to the lungs. A DVT can damage the valves in your leg veins so that instead of flowing upward, the blood pools in the lower leg. This is called post-thrombotic syndrome, and it can result in pain, swelling, discoloration, and sores on the leg. CAUSES A DVT is caused by the formation of a blood clot in your leg, pelvis, or arm. Usually, several things contribute to the formation of blood clots. A clot may develop when:  Your blood flow slows down.  Your vein becomes damaged in some way.  You have a condition that makes your blood clot more easily. RISK FACTORS A DVT is more likely to develop  in:  People who are older, especially over 67 years of age.  People who are overweight (obese).  People who sit or lie still for a long time, such as during long-distance travel (over 4 hours), bed rest, hospitalization, or during recovery from certain medical conditions like a stroke.  People who do not engage in much physical activity (sedentary lifestyle).  People who have chronic breathing disorders.  People who have a personal or family history of blood clots or blood clotting disease.  People who have peripheral vascular disease (PVD), diabetes, or some types of cancer.  People who have heart disease, especially if the person had a recent heart attack or has congestive heart failure.  People who have neurological diseases that affect the legs (leg paresis).  People who have had a traumatic injury, such as breaking a hip or leg.  People who have recently had major or lengthy surgery, especially on the hip, knee, or abdomen.  People who have had a central line placed inside a large  vein.  People who take medicines that contain the hormone estrogen. These include birth control pills and hormone replacement therapy.  Pregnancy or during childbirth or the postpartum period.  Long plane flights (over 8 hours). SIGNS AND SYMPTOMS Symptoms of a DVT can include:   Swelling of your leg or arm, especially if one side is much worse.  Warmth and redness of your leg or arm, especially if one side is much worse.  Pain in your arm or leg. If the clot is in your leg, symptoms may be more noticeable or worse when you stand or walk.  A feeling of pins and needles, if the clot is in the arm. The symptoms of a DVT that has traveled to the lungs (pulmonary embolism, PE) usually start suddenly and include:  Shortness of breath while active or at rest.  Coughing or coughing up blood or blood-tinged mucus.  Chest pain that is often worse with deep breaths.  Rapid or irregular heartbeat.  Feeling light-headed or dizzy.  Fainting.  Feeling anxious.  Sweating. There may also be pain and swelling in a leg if that is where the blood clot started. These symptoms may represent a serious problem that is an emergency. Do not wait to see if the symptoms will go away. Get medical help right away. Call your local emergency services (911 in the [place] Do not drive yourself to the hospital. DIAGNOSIS Your health care provider will take a medical history and perform a physical exam. You may also have other tests, including:  Blood tests to assess the clotting properties of your blood.  Imaging tests, such as CT, ultrasound, MRI, X-ray, and other tests to see if you have clots anywhere in your body. TREATMENT After a DVT is identified, it can be treated. The type of treatment that you receive depends on many factors, such as the cause of your DVT, your risk for bleeding or developing more clots, and other medical conditions that you have. Sometimes, a combination of treatments is  necessary. Treatment options may be combined and include:  Monitoring the blood clot with ultrasound.  Taking medicines by mouth, such as newer blood thinners (anticoagulants), thrombolytics, or warfarin.  Taking anticoagulant medicine by injection or through an IV tube.  Wearing compression stockings or using different types ofdevices.  Surgery (rare) to remove the blood clot or to place a filter in your abdomen to stop the blood clot from traveling to your lungs. Treatments for a  DVT are often divided into immediate treatment and long-term treatment (up to 3 months after DVT). You can work with your health care provider to choose the treatment program that is best for you. HOME CARE INSTRUCTIONS If you are taking a newer oral anticoagulant:  Take the medicine every single day at the same time each day.  Understand what foods and drugs interact with this medicine.  Understand that there are no regular blood tests required when using this medicine.  Understand the side effects of this medicine, including excessive bruising or bleeding. Ask your health care provider or pharmacist about other possible side effects. If you are taking warfarin:  Understand how to take warfarin and know which foods can affect how warfarin works in [employer] account [employer].  Understand that it is dangerous to take too much or too little warfarin. Too much warfarin increases the risk of bleeding. Too little warfarin continues to allow the risk for blood clots.  Follow your PT and INR blood testing schedule. The PT and INR results allow your health care provider to adjust your dose of warfarin. It is very important that you have your PT and INR tested as often as told by your health care provider.  Avoid major changes in your diet, or tell your health care provider before you change your diet. Arrange a visit with a registered dietitian to answer your questions. Many foods, especially foods that are high in vitamin K, can  interfere with warfarin and affect the PT and INR results. Eat a consistent amount of foods that are high in vitamin K, such as:  Spinach, kale, broccoli, cabbage, collard greens, turnip greens, Brussels sprouts, peas, cauliflower, seaweed, and parsley.  Beef liver and pork liver.  Green tea.  Soybean oil.  Tell your health care provider about any and all medicines, vitamins, and supplements that you take, including aspirin and other over-the-counter anti-inflammatory medicines. Be especially cautious with aspirin and anti-inflammatory medicines. Do not take those before you ask your health care provider if it is safe to do so. This is important because many medicines can interfere with warfarin and affect the PT and INR results.  Do not start or stop taking any over-the-counter or prescription medicine unless your health care provider or pharmacist tells you to do so. If you take warfarin, you will also need to do these things:  Hold pressure over cuts for longer than usual.  Tell your dentist and other health care providers that you are taking warfarin before you have any procedures in which bleeding may occur.  Avoid alcohol or drink very small amounts. Tell your health care provider if you change your alcohol intake.  Do not use tobacco products, including cigarettes, chewing tobacco, and e-cigarettes. If you need help quitting, ask your health care provider.  Avoid contact sports. General Instructions  Take over-the-counter and prescription medicines only as told by your health care provider. Anticoagulant medicines can have side effects, including easy bruising and difficulty stopping bleeding. If you are prescribed an anticoagulant, you will also need to do these things:  Hold pressure over cuts for longer than usual.  Tell your dentist and other health care providers that you are taking anticoagulants before you have any procedures in which bleeding may occur.  Avoid contact  sports.  Wear a medical alert bracelet or carry a medical alert card that says you have had a PE.  Ask your health care provider how soon you can go back to your normal activities. Stay  active to prevent new blood clots from forming.  Make sure to exercise while traveling or when you have been sitting or standing for a long period of time. It is very important to exercise. Exercise your legs by walking or by tightening and relaxing your leg muscles often. Take frequent walks.  Wear compression stockings as told by your health care provider to help prevent more blood clots from forming.  Do not use tobacco products, including cigarettes, chewing tobacco, and e-cigarettes. If you need help quitting, ask your health care provider.  Keep all follow-up appointments with your health care provider. This is important. PREVENTION Take these actions to decrease your risk of developing another DVT:  Exercise regularly. For at least 30 minutes every day, engage in:  Activity that involves moving your arms and legs.  Activity that encourages good blood flow through your body by increasing your heart rate.  Exercise your arms and legs every hour during long-distance travel (over 4 hours). Drink plenty of water and avoid drinking alcohol while traveling.  Avoid sitting or lying in bed for long periods of time without moving your legs.  Maintain a weight that is appropriate for your height. Ask your health care provider what weight is healthy for you.  If you are a woman who is over 24 years of age, avoid unnecessary use of medicines that contain estrogen. These include birth control pills.  Do not smoke, especially if you take estrogen medicines. If you need help quitting, ask your health care provider. If you are hospitalized, prevention measures may include:  Early walking after surgery, as soon as your health care provider says that it is safe.  Receiving anticoagulants to prevent blood  clots.If you cannot take anticoagulants, other options may be available, such as wearing compression stockings or using different types of devices. SEEK IMMEDIATE MEDICAL CARE IF:  You have new or increased pain, swelling, or redness in an arm or leg.  You have numbness or tingling in an arm or leg.  You have shortness of breath while active or at rest.  You have chest pain.  You have a rapid or irregular heartbeat.  You feel light-headed or dizzy.  You cough up blood.  You notice blood in your vomit, bowel movement, or urine. These symptoms may represent a serious problem that is an emergency. Do not wait to see if the symptoms will go away. Get medical help right away. Call your local emergency services (911 in the [place] Do not drive yourself to the hospital.   This information is not intended to replace advice given to you by your health care provider. Make sure you discuss any questions you have with your health care provider.   Document Released: 10/09/2005 Document Revised: 06/30/2015 Document Reviewed: 02/03/2015 Elsevier Interactive Patient Education 2016 [employer] Inc[employer]  Pericardial Effusion Pericardial effusion is a buildup of fluid around your heart. The heart is surrounded by a double-layered sac (pericardium). This sac normally contains a small amount of fluid. When too much builds up, it can put pressure on your heart and cause problems. As fluid builds up in the pericardial sac and pressure on your heart increases, it becomes harder for your heart to pump blood. When fluid prevents your heart from pumping enough blood, it is called cardiac tamponade. Cardiac tamponade is a life-threatening condition. CAUSES  Often the cause of pericardial effusion is not known (idiopathic effusion). Possible causes are from:  Infections, such as from a virus, bacteria, fungus, or parasite.  Damage to the pericardium from heart surgery or a heart attack.  Inflammatory diseases, such  as rheumatoid arthritis or lupus.  Kidney disease.  Thyroid disease.  Cancer.  Cancer treatment, including radiation or chemotherapy.  Certain drugs, including tuberculosis drugs or seizure drugs.  Chest trauma. SIGNS AND SYMPTOMS  Pericardial effusion may not cause symptoms at first, especially if the fluid builds up slowly. In time, pressure on the heart may cause:  Chest pain.  Trouble breathing.  Pain and shortness of breath that is worse when lying down.  Dizziness.  Fainting.  Cough.  Hiccups.  Skipped heartbeats (palpitations).  Anxiety and confusion.  A bluish skin color (cyanosis).  Swollen legs and ankles. DIAGNOSIS  Your health care provider may suspect pericardial effusion based on your symptoms. Your health care provider may also do a physical exam to check for:  Low blood pressure and weak pulses.  Soft (muffled) heart sounds.  Rapid heartbeat.  Full veins in your neck (distended jugular veins).  Decreased breathing sounds and a rubbing sound (friction rub) when listening to your lungs. Your health care provider may also do several tests to confirm the diagnosis and find out what is causing the pericardial effusion. These may include:  Chest X-ray.  Imaging study of the heart using sound waves (echocardiogram).  CT scan or MRI.  Electrical study of the heart (electrocardiogram [ECG]).  A procedure using a needle to remove fluid from the pericardium for examination (pericardiocentesis).  Blood tests to check for:  Infection.  Heart damage.  Thyroid abnormalities.  Kidney disease.  Inflammatory disorders. TREATMENT  Treatment for pericardial effusion depends on the cause of your symptoms and how severe your symptoms are. If a specific cause was found, that cause will be treated. Treatment may include:  Medicines, such as:  Nonsteroidal anti-inflammatory drugs (NSAIDs).  Other anti-inflammatory drugs, such as  steroids.  Antibiotic medicine.  Antifungal medicine.  Hospital treatment may be necessary, such as for cardiac tamponade. This may include:  Intravenous (IV) fluids.  Breathing support.  Surgery may be needed in severe cases. Surgery may include:  Pericardiocentesis.  Open heart surgery.  A procedure to make a permanent opening in the pericardium (pericardial window). SEEK MEDICAL CARE IF:  You feel dizzy or light-headed.  You have swelling in your legs or ankles.  You have heart palpitations.  You have persistent cough or hiccups. SEEK IMMEDIATE MEDICAL CARE IF:   You faint.  You have chest pain.  You have trouble breathing. These symptoms may represent a serious problem that is an emergency. Do not wait to see if the symptoms will go away. Get medical help right away. Call your local emergency services (911 in the [place] Do not drive yourself to the hospital. MAKE SURE YOU:  Understand these instructions.  Will watch your condition.  Will get help right away if you are not doing well or get worse.   This information is not intended to replace advice given to you by your health care provider. Make sure you discuss any questions you have with your health care provider.   Document Released: 06/06/2005 Document Revised: 10/30/2014 Document Reviewed: 02/26/2014 Elsevier Interactive Patient Education [employer]

## 2016-04-14 NOTE — Care Management Important Message (Signed)
Important Message  Patient Details  Name: [name] Small MRN: [id number] Date of Birth: April 23, 1991   Medicare Important Message Given:  Yes    Braeley Buskey Abena 04/14/2016, 11:25 AM

## 2016-04-14 NOTE — Procedures (Signed)
Tolerating HD treatment. No hemodynamic isssues. Will need lower EDW. Has some edema still present. [name] Small C

## 2016-04-14 NOTE — Progress Notes (Signed)
TELEMETRY: Reviewed telemetry pt in NSR: Filed Vitals:   04/13/16 1742 04/13/16 2045 04/13/16 2212 04/14/16 0500  BP: 151/90 140/102 142/102 147/102  Pulse: 91 94  91  Temp: 98.3 F (36.8 C) 98.3 F (36.8 C)  98.2 F (36.8 C)  TempSrc: Oral Oral  Oral  Resp:  18  16  Height:      Weight:    118 lb 8 oz (53.751 kg)  SpO2: 100% 99%  100%    Intake/Output Summary (Last 24 hours) at 04/14/16 1231 Last data filed at 04/14/16 0900  Gross per 24 hour  Intake    600 ml  Output   3000 ml  Net  -2400 ml   Filed Weights   04/13/16 1350 04/13/16 1657 04/14/16 0500  Weight: 125 lb (56.7 kg) 118 lb 6.2 oz (53.7 kg) 118 lb 8 oz (53.751 kg)    Subjective Still complains of anterior pleuritic type pain worse with deep breath. It is better than yesterday and intermittent.  Marland Kitchen alteplase  2 mg Intracatheter Once  . amLODipine  5 mg Oral Daily  . apixaban  5 mg Oral BID  . carvedilol  6.25 mg Oral BID WC  . cinacalcet  30 mg Oral Q supper  . colchicine  0.6 mg Oral BID  . hydrALAZINE  25 mg Oral TID  . isosorbide dinitrate  10 mg Oral TID  . multivitamin  1 tablet Oral Daily  . nicotine  14 mg Transdermal Daily  . sertraline  100 mg Oral Daily  . sevelamer carbonate  2,400 mg Oral TID WC  . sodium chloride flush  3 mL Intravenous Q12H      LABS: Basic Metabolic Panel:  Recent Labs  14/23/95 0410 04/14/16 0735  NA 135 135  K 4.0 4.1  CL 98* 97*  CO2 22 24  GLUCOSE 83 76  BUN 58* 40*  CREATININE 10.24* 7.43*  CALCIUM 10.0 10.8*  PHOS  --  7.9*   Liver Function Tests:  Recent Labs  04/12/16 0445 04/13/16 0410 04/14/16 0735  AST 24 24  --   ALT 5* 7*  --   ALKPHOS 272* 316*  --   BILITOT 0.8 0.7  --   PROT 7.3 8.0  --   ALBUMIN 2.9* 3.3* 3.2*   No results for input(s): LIPASE, AMYLASE in the last 72 hours. CBC:  Recent Labs  04/12/16 0445 04/13/16 0410  WBC 4.4 8.0  HGB 9.0* 10.1*  HCT 28.8* 32.8*  MCV 88.6 91.6  PLT 249 231   Cardiac  Enzymes:  Recent Labs  04/11/16 2308 04/12/16 0445 04/12/16 1059  TROPONINI 0.46* 0.39* 0.33*   BNP: No results for input(s): PROBNP in the last 72 hours. D-Dimer: No results for input(s): DDIMER in the last 72 hours. Hemoglobin A1C: No results for input(s): HGBA1C in the last 72 hours. Fasting Lipid Panel: No results for input(s): CHOL, HDL, LDLCALC, TRIG, CHOLHDL, LDLDIRECT in the last 72 hours. Thyroid Function Tests: No results for input(s): TSH, T4TOTAL, T3FREE, THYROIDAB in the last 72 hours.  Invalid input(s): FREET3   Radiology/Studies:  No results found.  PHYSICAL EXAM General: chronically ill appearing, well nourished, female in no acute distress Head: Eyes PERRLA, No xanthomas. Normocephalic and atraumatic, oropharynx without edema or exudate.  Lungs: Resp regular and unlabored, CTA. Heart: RRR Loud s3, no s4, rub,or murmurs..  Neck: No carotid bruits. No lymphadenopathy. JVD. Abdomen: Bowel sounds present, abdomen soft and non-tender without masses or hernias noted.  Msk: No spine or cva tenderness. No weakness, no joint deformities or effusions. Extremities: No clubbing, cyanosis. 1+ BL LE edema. DP 2+ and equal bilaterally. LUE fistula. L arm swollen compared to right. right femoral vasc cath in place. Neuro: Alert and oriented X 3. No focal deficits noted. Psych: Good affect, responds appropriately Skin: No rashes or lesions noted.  ASSESSMENT AND PLAN: 1. Chest pain. Pleuritic in nature. No evidence of PE by CT. No Ecg changes or rub but she does have a small posterior effusion on Echo.Giving an  empiric trial of colchicine. Pain is better. Will continue for now. Chronic troponin elevation is not consistent with ischemia. No further recommendations from a cardiac standpoint. Please call with questions. 2. ESRD on dialysis 3. NICM EF 20%. Continue coreg, hydralazine, Imdur.   Present on Admission:  . Chest pain . Systolic CHF, chronic (HCC) .  Failed kidney transplant . Elevated serum hCG . Anemia of chronic renal failure, stage 5 (HCC) . AICD (automatic cardioverter/defibrillator) present  Signed, Kadden Osterhout [place], MDFACC 04/14/2016 12:31 PM

## 2016-04-14 NOTE — Progress Notes (Signed)
PROGRESS NOTE    [name] Small  HUO:[id number]  DOB: 05/17/1991  DOA: 04/11/2016 PCP: Jeanann Lewandowsky, MD Outpatient Specialists:   Hospital course: [name] Small is a 25 y.o. 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 She presented to the ED tonight for evaluation of left sided pleuritic chest pain. The pain occurs at rest. It was constant and stabbing in nature earlier today, which prompted presentation for further evaluation. She says that she typically has recurrent chest pain on the right side of her chest, but no on the left. This pain has not been associated with shortness of breath or syncope, but she has had light-headedness. No known history of MI or CAD. CTA chest negative for PE.  She does have a history of PEs in the past.    Assessment & Plan:   Pleuritic chest pain with abnormal troponin (in the context of ESRD) with accelerated HTN and recent diagnosis of LUE DVT. Differential certainly included PE (althoug the patient reports that she has been compliant with Eliquis as prescribed at discharge from [place] earlier this month), pericarditis, and MI.  Small pericardial effusion seen on Echo.  Started on colchicine 6/22 by cardiology.   --CTA chest negative for PE. Plan to continue anticoagulation with Eliquis.  --Pain improved per patient.   --Serial troponin, repeat EKG in the AM --Complete echo done shows small post pericardial effusion --Incentive spirometer use encouraged --Aspirin not ordered due to reported allergy  LUE DVT - fairly recent, site of prior dialysis graft, patient now reporting spontaneous drainage from her left arm but there is no obvious  wound dehiscence --The patient was evaluated by vascular surgery at [place]. It does not appear that she was considered to be a candidate for thrombolytics at that time.  ESRD on HD, she now has a right femoral permacath that has reportedly been in for approximately one month --Requesting nephrology consult for HD,  HD given on 6/21, 6/22 and 6/23.  A lot of fluid was removed.   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  DVT prophylaxis: FULL anticoagulation Code Status: FULL Family Communication: patient updated at bedside Disposition Plan: Home today or tomorrow pending cardiology opionion Consults called: It appears that nephrology was called from the ED in [facility], but they will need to be called again in the morning to make sure she gets HD orders. Admission status: Inpatient, stepdown unit  Consultants:  Nephrology  Subjective: Pt says that she feels better and less chest pain.  Wants to go home after HD today.   Objective: Filed Vitals:   04/13/16 1742 04/13/16 2045 04/13/16 2212 04/14/16 0500  BP: 151/90 140/102 142/102 147/102  Pulse: 91 94  91  Temp: 98.3 F (36.8 C) 98.3 F (36.8 C)  98.2 F (36.8 C)  TempSrc: Oral Oral  Oral  Resp:  18  16  Height:      Weight:    118 lb 8 oz (53.751 kg)  SpO2: 100% 99%  100%    Intake/Output Summary (Last 24 hours) at 04/14/16 1113 Last data filed at 04/14/16 0900  Gross per 24 hour  Intake    600 ml  Output   3000 ml  Net   -2400 ml   Filed Weights   04/13/16 1350 04/13/16 1657 04/14/16 0500  Weight: 125 lb (56.7 kg) 118 lb 6.2 oz (53.7 kg) 118 lb 8 oz (53.751 kg)    Exam:  Eyes: PERRL, lids and conjunctivae normal, no scleral icterus. ENMT: Mucous membranes are moist. Posterior pharynx clear of any exudate or lesions. Normal dentition.  Neck: normal appearance, supple Respiratory: clear to auscultation bilaterally, no wheezing, no crackles. Normal respiratory effort. No accessory muscle use.  Cardiovascular: Tachycardic but regular. I did not hear a murmur or rub. She has 1+ pitting edema in bilateral lower extremities. 2+ pedal pulses.  GI: abdomen is soft and compressible. No distention. No tenderness. No masses palpated. Bowel sounds are present. Musculoskeletal: OLD LUE fistula noted, no wound dehiscence. Left arm obviously swollen compared to right. Otherwise, good ROM, no contractures. Normal muscle tone.  Skin: no rashes, warm and dry Neurologic: No focal deficits Psychiatric: Normal judgment and insight. Alert and oriented x 3. Normal mood but somewhat flat affect.  Data Reviewed: Basic Metabolic Panel:  Recent Labs Lab 04/11/16 1130 04/12/16 0445 04/13/16 0410 04/14/16 0735  NA 137 137 135 135  K 4.2 4.7 4.0 4.1  CL 99* 98* 98* 97*  CO2 20* 20* 22 24  GLUCOSE 82 148* 83 76  BUN 88* 94* 58* 40*  CREATININE 14.58* 15.36* 10.24* 7.43*  CALCIUM 9.6 8.7* 10.0 10.8*  PHOS  --   --   --  7.9*   Liver Function Tests:  Recent Labs Lab 04/12/16 0445 04/13/16 0410 04/14/16 0735  AST 24 24  --   ALT 5* 7*  --   ALKPHOS 272* 316*  --   BILITOT 0.8 0.7  --   PROT 7.3 8.0  --   ALBUMIN 2.9* 3.3* 3.2*   No results for input(s): LIPASE, AMYLASE in the last 168 hours. No results for input(s): AMMONIA in the last 168 hours. CBC:  Recent Labs Lab 04/11/16 1130 04/12/16 0445 04/13/16 0410  WBC 5.9 4.4 8.0  HGB 9.5* 9.0* 10.1*  HCT 29.3* 28.8* 32.8*  MCV 91.0 88.6 91.6    PLT 264 249 231   Cardiac Enzymes:  Recent Labs Lab 04/11/16 1130 04/11/16 1450 04/11/16 2308 04/12/16 0445 04/12/16 1059  TROPONINI 0.54* 0.53* 0.46* 0.39* 0.33*   BNP (last 3 results) No results for input(s): PROBNP in the last 8760 hours. CBG: No results for input(s): GLUCAP in the last 168 hours.  Recent Results (from the past 240 hour(s))  MRSA PCR Screening     Status: None   Collection Time: 04/11/16  6:27 PM  Result Value Ref Range Status   MRSA by PCR NEGATIVE NEGATIVE Final    Comment:        The GeneXpert MRSA Assay (FDA approved for NASAL specimens only), is one component of a comprehensive MRSA colonization surveillance program. It is not intended to diagnose MRSA infection nor to guide or monitor treatment for MRSA infections.     Studies: No results found.  Scheduled Meds: . alteplase  2 mg Intracatheter Once  . amLODipine  5 mg  Oral Daily  . apixaban  5 mg Oral BID  . carvedilol  6.25 mg Oral BID WC  . cinacalcet  30 mg Oral Q supper  . colchicine  0.6 mg Oral BID  . hydrALAZINE  25 mg Oral TID  . isosorbide dinitrate  10 mg Oral TID  . multivitamin  1 tablet Oral Daily  . nicotine  14 mg Transdermal Daily  . sertraline  100 mg Oral Daily  . sevelamer carbonate  2,400 mg Oral TID WC  . sodium chloride flush  3 mL Intravenous Q12H   Continuous Infusions:   Principal Problem:   Chest pain Active Problems:   ESRD (end stage renal disease) on dialysis (HCC)   Systolic CHF, chronic (HCC)   Anemia of chronic renal failure, stage 5 (HCC)   Elevated serum hCG   AICD (automatic cardioverter/defibrillator) present   Failed kidney transplant   Hypertensive urgency   DVT (deep venous thrombosis), left   Chronic anticoagulation  Time spent:   Standley Dakins, MD, FAAFP [facility] Pager [phone]  If 7PM-7AM, please contact night-coverage [email or web address] Password [email or web address] 04/14/2016, 11:13 AM    LOS: 3 days

## 2016-05-02 ENCOUNTER — Ambulatory Visit: Payer: Medicare Other | Admitting: Family Medicine

## 2016-05-08 ENCOUNTER — Other Ambulatory Visit: Payer: Self-pay | Admitting: Vascular Surgery

## 2016-05-09 ENCOUNTER — Encounter
Admission: RE | Disposition: A | Discharge status: Home / Self Care | Payer: Self-pay | Source: Ambulatory Visit | Attending: Vascular Surgery

## 2016-05-09 ENCOUNTER — Ambulatory Visit
Admission: RE | Admit: 2016-05-09 | Discharge: 2016-05-09 | Disposition: A | Discharge status: Home / Self Care | Payer: Medicare Other | Source: Ambulatory Visit | Attending: Vascular Surgery | Admitting: Vascular Surgery

## 2016-05-09 DIAGNOSIS — Z8249 Family history of ischemic heart disease and other diseases of the circulatory system: Secondary | ICD-10-CM | POA: Insufficient documentation

## 2016-05-09 DIAGNOSIS — R0602 Shortness of breath: Secondary | ICD-10-CM | POA: Insufficient documentation

## 2016-05-09 DIAGNOSIS — F329 Major depressive disorder, single episode, unspecified: Secondary | ICD-10-CM | POA: Insufficient documentation

## 2016-05-09 DIAGNOSIS — Z886 Allergy status to analgesic agent status: Secondary | ICD-10-CM | POA: Diagnosis not present

## 2016-05-09 DIAGNOSIS — Z87891 Personal history of nicotine dependence: Secondary | ICD-10-CM | POA: Diagnosis not present

## 2016-05-09 DIAGNOSIS — T8611 Kidney transplant rejection: Secondary | ICD-10-CM | POA: Diagnosis not present

## 2016-05-09 DIAGNOSIS — Z992 Dependence on renal dialysis: Secondary | ICD-10-CM | POA: Insufficient documentation

## 2016-05-09 DIAGNOSIS — Y832 Surgical operation with anastomosis, bypass or graft as the cause of abnormal reaction of the patient, or of later complication, without mention of misadventure at the time of the procedure: Secondary | ICD-10-CM | POA: Insufficient documentation

## 2016-05-09 DIAGNOSIS — N186 End stage renal disease: Secondary | ICD-10-CM | POA: Diagnosis not present

## 2016-05-09 DIAGNOSIS — Z7901 Long term (current) use of anticoagulants: Secondary | ICD-10-CM | POA: Insufficient documentation

## 2016-05-09 DIAGNOSIS — I509 Heart failure, unspecified: Secondary | ICD-10-CM | POA: Diagnosis not present

## 2016-05-09 DIAGNOSIS — Z91041 Radiographic dye allergy status: Secondary | ICD-10-CM | POA: Insufficient documentation

## 2016-05-09 DIAGNOSIS — F419 Anxiety disorder, unspecified: Secondary | ICD-10-CM | POA: Diagnosis not present

## 2016-05-09 DIAGNOSIS — M810 Age-related osteoporosis without current pathological fracture: Secondary | ICD-10-CM | POA: Diagnosis not present

## 2016-05-09 DIAGNOSIS — I132 Hypertensive heart and chronic kidney disease with heart failure and with stage 5 chronic kidney disease, or end stage renal disease: Secondary | ICD-10-CM | POA: Insufficient documentation

## 2016-05-09 DIAGNOSIS — K219 Gastro-esophageal reflux disease without esophagitis: Secondary | ICD-10-CM | POA: Insufficient documentation

## 2016-05-09 DIAGNOSIS — I429 Cardiomyopathy, unspecified: Secondary | ICD-10-CM | POA: Diagnosis not present

## 2016-05-09 DIAGNOSIS — Z885 Allergy status to narcotic agent status: Secondary | ICD-10-CM | POA: Insufficient documentation

## 2016-05-09 DIAGNOSIS — L03211 Cellulitis of face: Secondary | ICD-10-CM | POA: Insufficient documentation

## 2016-05-09 DIAGNOSIS — Z940 Kidney transplant status: Secondary | ICD-10-CM | POA: Insufficient documentation

## 2016-05-09 DIAGNOSIS — Z841 Family history of disorders of kidney and ureter: Secondary | ICD-10-CM | POA: Insufficient documentation

## 2016-05-09 DIAGNOSIS — Z86711 Personal history of pulmonary embolism: Secondary | ICD-10-CM | POA: Insufficient documentation

## 2016-05-09 DIAGNOSIS — Z905 Acquired absence of kidney: Secondary | ICD-10-CM | POA: Insufficient documentation

## 2016-05-09 DIAGNOSIS — D571 Sickle-cell disease without crisis: Secondary | ICD-10-CM | POA: Diagnosis not present

## 2016-05-09 DIAGNOSIS — Q613 Polycystic kidney, unspecified: Secondary | ICD-10-CM | POA: Insufficient documentation

## 2016-05-09 DIAGNOSIS — T82898A Other specified complication of vascular prosthetic devices, implants and grafts, initial encounter: Secondary | ICD-10-CM | POA: Diagnosis not present

## 2016-05-09 DIAGNOSIS — Z95810 Presence of automatic (implantable) cardiac defibrillator: Secondary | ICD-10-CM | POA: Diagnosis not present

## 2016-05-09 HISTORY — PX: PERIPHERAL VASCULAR CATHETERIZATION: SHX172C

## 2016-05-09 SURGERY — DIALYSIS/PERMA CATHETER REMOVAL
Anesthesia: Moderate Sedation

## 2016-05-09 MED ORDER — LIDOCAINE-EPINEPHRINE (PF) 1 %-1:200000 IJ SOLN
INTRAMUSCULAR | Status: DC | PRN
Start: 2016-05-09 — End: 2016-05-09
  Administered 2016-05-09: 10 mL via INTRADERMAL

## 2016-05-09 MED ORDER — LIDOCAINE-EPINEPHRINE (PF) 1 %-1:200000 IJ SOLN
INTRAMUSCULAR | Status: AC
Start: 2016-05-09 — End: 2016-05-09
  Filled 2016-05-09: qty 30

## 2016-05-09 MED ORDER — LIDOCAINE-EPINEPHRINE (PF) 1 %-1:200000 IJ SOLN
INTRAMUSCULAR | Status: AC
  Filled 2016-05-09: qty 30

## 2016-05-09 SURGICAL SUPPLY — 4 items
FCP FG STRG 5.5XNS LF DISP (INSTRUMENTS) ×1
FORCEPS FG STRG 5.5XNS LF DISP (INSTRUMENTS) ×1 IMPLANT
FORCEPS KELLY 5.5 STR (INSTRUMENTS) ×1
TRAY LACERAT/PLASTIC (MISCELLANEOUS) ×2 IMPLANT

## 2016-05-09 NOTE — Op Note (Signed)
  OPERATIVE NOTE   PROCEDURE: 1. Removal of a right femoral tunneled dialysis catheter  PRE-OPERATIVE DIAGNOSIS: Complication of dialysis catheter, End stage renal disease  POST-OPERATIVE DIAGNOSIS: Same  SURGEON: Raman Featherston, Latina Craver, M.D.  ANESTHESIA: Local anesthetic with 1% lidocaine with epinephrine   ESTIMATED BLOOD LOSS: Minimal   FINDING(S): 1. Catheter intact   SPECIMEN(S):  Catheter  INDICATIONS:   [name] Small is a 25 y.o. female who presents with nonfunctioning right femoral tunneled catheter. Her left arm access has been utilized effectively.  The patient has undergone placement of an extremity access which is working and this has been successfully cannulated without difficulty.  therefore is undergoing removal of his tunneled catheter which is no longer needed to avoid septic complications.   DESCRIPTION: After obtaining full informed written consent, the patient was positioned supine. The right femoral catheter and surrounding area is prepped and draped in a sterile fashion. The cuff was localized by palpation and noted to be less than 3 cm from the exit site. After appropriate timeout is called, 1% lidocaine with epinephrine is infiltrated into the surrounding tissues around the cuff. Small transverse incision is created at the exit site with an 11 blade scalpel and the dissection was carried up along the catheter to expose the cuff of the tunneled catheter.  The catheter cuff is then freed from the surrounding attachments and adhesions. Once the catheter has been freed circumferentially it is removed in 1 piece. Light pressure was held at the base of the neck.   Antibiotic ointment and a sterile dressing is applied to the exit site. Patient tolerated procedure well and there were no complications.  COMPLICATIONS: None  CONDITION: Unchanged  Sharilyn Geisinger, Latina Craver, M.D. [facility] Office: [phone]  05/09/2016,12:20 PM

## 2016-05-09 NOTE — H&P (Signed)
[facility] VEIN [facility] Admission History & Physical  MRN : [id number]  [name] Small is a 25 y.o. (1991-05-09) female who presents with chief complaint of have catheter out.  History of Present Illness: Patient is sent by the dialysis center for removal of the tunneled catheter. She has an extremity access that has been working. She has not missed any dialysis runs.  She denies fever or chills. No drainage at the catheter site. Catheter no longer aspirates.  No current facility-administered medications for this encounter.    Past Medical History  Diagnosis Date  . Hemodialysis patient (HCC)     Mon. Wed. Fri  . Hypertension   . Pulmonary emboli (HCC) 01/2012    Bilateral, moderate clot burden, areas of pulmonary infarction and central necrosis  . CHF (congestive heart failure) (HCC)   . Cardiomyopathy   . Dysrhythmia     at times per pt.  . Anemia   . H/O transfusion of packed red blood cells   . Cellulitis and abscess of face 03/22/2013  . Chronic pain   . GERD (gastroesophageal reflux disease)   . Depression   . Hyperkalemia 09/2015  . Pelvic fracture (HCC) 06/2015    "on the right"  . Osteoporosis   . Narcotic abuse, continuous   . 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 Anxiety   . Sickle cell anemia (HCC)   . 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 Polycystic kidney disease   . Renal insufficiency   . ESRD (end stage renal disease) on dialysis [facility])     "MWF; [facility]" (04/11/2016)  . Chronic anticoagulation     Past Surgical History  Procedure Laterality Date  . Nephrectomy Right     third kidney placed in 2008, and body rejected in 2012   . Av fistula placement Bilateral     "right side stopped working & never had it revised" (04/11/2016)  . Kidney transplant  2008    failed  .  Incision and drainage abscess Right 03/21/2013    Procedure: INCISION AND DRAINAGE RIGHT CHEEK ABSCESS REMOVAL OF FOREIGN BODY;  Surgeon: Serena Colonel, MD;  Location: [facility] OR;  Service: ENT;  Laterality: Right;  . Implantable cardioverter defibrillator implant Right 12/2014  . Peripheral vascular catheterization Left 03/09/2016    Procedure: A/V Shuntogram/Fistulagram;  Surgeon: Annice Needy, MD;  Location: [facility] INVASIVE CV LAB;  Service: Cardiovascular;  Laterality: Left;  . Insertion of dialysis catheter Right Mar 09, 2016    thigh, Dr. Wyn Quaker, [facility]  . Cardiac catheterization    . Revison of arteriovenous fistula Left 03/22/2016    Procedure: REVISON OF ARTERIOVENOUS FISTULA ( ARTEGRAFT );  Surgeon: Annice Needy, MD;  Location: [facility] ORS;  Service: Vascular;  Laterality: Left;  . Tonsillectomy and adenoidectomy  ~ 2000    Social History Social History  Substance Use Topics  . Smoking status: Former Smoker -- 0.00 packs/day for 1 years    Types: Cigarettes    Quit date: 12/08/2015  . Smokeless tobacco: Never Used  . Alcohol Use: No    Family History Family History  Problem Relation Age of Onset  . Polycystic kidney disease Father   . Hypertension Father   The patient denies a family history of porphyria, autoimmune disease or bleeding clotting disorders.  Allergies  Allergen Reactions  . Aspirin Other (See  Comments)    Interacts with Coreg  . Tramadol Anaphylaxis  . Vicodin [Hydrocodone-Acetaminophen] Hives  . Buprenorphine Hcl Itching and Other (See Comments)    Ok with oxycodone  . Iohexol Itching  . Morphine And Related Itching and Other (See Comments)    Ok with oxycodone     REVIEW OF SYSTEMS (Negative unless checked)  Constitutional: Weight loss  Fever  Chills Cardiac: Chest pain   Chest pressure   Palpitations   Shortness of breath when laying flat   Shortness of breath at rest   Shortness of breath with exertion. Vascular:  Pain in legs with walking    Pain in legs at rest   Pain in legs when laying flat   Claudication   Pain in feet when walking  Pain in feet at rest  Pain in feet when laying flat   History of DVT   Phlebitis   Swelling in legs   Varicose veins   Non-healing ulcers Pulmonary:   Uses home oxygen   Productive cough   Hemoptysis   Wheeze  COPD   Asthma Neurologic:  Dizziness  Blackouts   Seizures   History of stroke   History of TIA  Aphasia   Temporary blindness   Dysphagia   Weakness or numbness in arms   Weakness or numbness in legs Musculoskeletal:  Arthritis   Joint swelling   Joint pain   Low back pain Hematologic:  Easy bruising  Easy bleeding   Hypercoagulable state   Anemic  Hepatitis Gastrointestinal:  Blood in stool   Vomiting blood  Gastroesophageal reflux/heartburn   Difficulty swallowing. Genitourinary:  Chronic kidney disease   Difficult urination  Frequent urination  Burning with urination   Blood in urine Skin:  Rashes   Ulcers   Wounds Psychological:  History of anxiety    History of major depression.  Physical Examination  Filed Vitals:   05/09/16 1027  BP: 155/118  Pulse: 95  Temp: 98.2 F (36.8 C)  Resp: 22  Height:  (1.6 m)  SpO2: 93%   There is no weight on file to calculate BMI. Gen: WD/WN, NAD Head: [place]/AT, No temporalis wasting. Prominent temp pulse not noted. Ear/Nose/Throat: Hearing grossly intact, nares w/o erythema or drainage, oropharynx w/o Erythema/Exudate,  Eyes: PERRLA, EOMI.  Neck: Supple, no nuchal rigidity.  No bruit or JVD.  Pulmonary:  Good air movement, clear to auscultation bilaterally, no use of accessory muscles.  Cardiac: RRR, normal S1, S2, no Murmurs, rubs or gallops. Vascular: Catheter intact AV access good thrill good bruit Vessel Right Left  Radial Palpable Palpable  Ulnar Not Palpable Not Palpable  Brachial Palpable Palpable       Gastrointestinal: soft,  non-tender/non-distended. No guarding/reflex.  Musculoskeletal: M/S 5/5 throughout.  Extremities without ischemic changes.  No deformity or atrophy.  Neurologic: CN 2-12 intact. Pain and light touch intact in extremities.  Symmetrical.  Speech is fluent. Motor exam as listed above. Psychiatric: Judgment intact, Mood & affect appropriate for pt's clinical situation. Dermatologic: No rashes or ulcers noted.  No cellulitis or open wounds. Lymph : No Cervical, Axillary, or Inguinal lymphadenopathy.   CBC Lab Results  Component Value Date   WBC 8.0 04/13/2016   HGB 10.1* 04/13/2016   HCT 32.8* 04/13/2016   MCV 91.6 04/13/2016   PLT 231 04/13/2016    BMET    Component Value Date/Time   NA 135 04/14/2016 0735   K 4.1 04/14/2016 0735   CL 97* 04/14/2016 0735  CO2 24 04/14/2016 0735   GLUCOSE 76 04/14/2016 0735   BUN 40* 04/14/2016 0735   CREATININE 7.43* 04/14/2016 0735   CALCIUM 10.8* 04/14/2016 0735   GFRNONAA 7* 04/14/2016 0735   GFRAA 8* 04/14/2016 0735   CrCl cannot be calculated (Unknown ideal weight.).  COAG Lab Results  Component Value Date   INR 1.55* 04/12/2016   INR 1.35 03/31/2016   INR 1.21 03/16/2016    Radiology Dg Chest 2 View  04/11/2016  CLINICAL DATA:  Shortness of breath.  Chest pain for 2 days. EXAM: CHEST  2 VIEW COMPARISON:  03/11/2016 FINDINGS: Stable moderate cardiomegaly. AICD noted. This is unchanged imposition. No edema. Lingular subsegmental atelectasis. No pleural effusion. IVC filter noted. IMPRESSION: 1. Moderate cardiomegaly, stable. 2. New lingular subsegmental atelectasis. Electronically Signed   By: Gaylyn Rong M.D.   On: 04/11/2016 12:05   Ct Angio Chest Pe W/cm &/or Wo Cm  04/12/2016  CLINICAL DATA:  Acute onset of pleuritic chest pain. Recently diagnosed with deep venous thrombosis at the left upper extremity. Initial encounter. EXAM: CT ANGIOGRAPHY CHEST WITH CONTRAST TECHNIQUE: Multidetector CT imaging of the chest was performed  using the standard protocol during bolus administration of intravenous contrast. Multiplanar CT image reconstructions and MIPs were obtained to evaluate the vascular anatomy. CONTRAST:  80 mL of Isovue 370 IV contrast COMPARISON:  Chest radiograph performed 04/11/2016 FINDINGS: There is no evidence of pulmonary embolus. A trace right-sided pleural effusion is noted, with mild bibasilar atelectasis. The lungs are otherwise grossly clear. There is no evidence of pneumothorax. No masses are identified; no abnormal focal contrast enhancement is seen. The heart is significantly enlarged. A right-sided AICD is noted, with a single lead ending at the right ventricle. Trace pericardial fluid is noted. No mediastinal lymphadenopathy is seen. The great vessels are grossly unremarkable in appearance. No axillary lymphadenopathy is seen. The visualized portions of the thyroid gland are unremarkable in appearance. Small volume ascites is noted within the upper abdomen. The visualized portions of the liver and spleen are grossly unremarkable. The visualized portions of the gallbladder, pancreas and adrenal glands are within normal limits. Scattered vague hypodense and hyperdense cystic foci are seen within both kidneys, with mild bilateral renal atrophy and scarring. No acute osseous abnormalities are seen. Review of the MIP images confirms the above findings. IMPRESSION: 1. No evidence of pulmonary embolus. 2. Trace right-sided pleural effusion, with mild bibasilar atelectasis. 3. Significant cardiomegaly.  Trace pericardial fluid noted. 4. Small volume ascites within the upper abdomen. 5. Mild bilateral renal atrophy and scarring, with scattered vague hypodense and hyperdense cystic foci in both kidneys. Electronically Signed   By: Roanna Raider M.D.   On: 04/12/2016 01:11   US Venous Img Upper Uni Left  04/11/2016  CLINICAL DATA:  Patient was diagnosed with left internal jugular vein deep venous thrombosis on 04/01/2016,  now presenting with worsening swelling and pain in the left arm. History of left brachiocephalic AV fistula, jump graft revision of left arm AV fistula and ligation of the aneurysmal fistula 03/22/2016. The patient is anticoagulated. EXAM: LEFT UPPER EXTREMITY VENOUS DOPPLER ULTRASOUND TECHNIQUE: Gray-scale sonography with graded compression, as well as color Doppler and duplex ultrasound were performed to evaluate the upper extremity deep venous system from the level of the subclavian vein and including the jugular, axillary, basilic, radial, ulnar and upper cephalic vein. Spectral Doppler was utilized to evaluate flow at rest and with distal augmentation maneuvers. COMPARISON:  None. FINDINGS: Contralateral Subclavian Vein: Respiratory  phasicity is normal and symmetric with the symptomatic side. No evidence of thrombus. Normal compressibility. Internal Jugular Vein: There is an occlusive thrombus within long segment of the internal jugular vein, which demonstrates no compressibility. Subclavian Vein: No evidence of thrombus. Normal compressibility, respiratory phasicity and response to augmentation. Axillary Vein: No evidence of thrombus. Normal compressibility, respiratory phasicity and response to augmentation. Cephalic Vein: No evidence of thrombus. Normal compressibility, respiratory phasicity and response to augmentation. Basilic Vein: Not visualized. Brachial Veins: Limited visualization. Radial Veins: No evidence of thrombus. Normal compressibility, respiratory phasicity and response to augmentation. Ulnar Veins: No evidence of thrombus. Normal compressibility, respiratory phasicity and response to augmentation. Venous Reflux:  None visualized. Other Findings: O occluded left av fistula, post ligation is seen in the upper arm. A mid upper arm jump graft is patent and measures 9 mm in cross-section. Left upper arm subcutaneous soft tissue edema is seen. IMPRESSION: Occlusive thrombus within long segment of  the internal jugular vein to the level of the takeoff of the subclavian vein. Patent mid left upper arm jump graft measuring 9 mm in cross-section. Soft tissue edema. Electronically Signed   By: Ted Mcalpine M.D.   On: 04/11/2016 13:36    Assessment/Plan 1.  Complication dialysis device with nonfunction of tunneled catheter:  Patient's extremity AV dialysis access is functioning well. The patient will undergo removal of the tunneled catheter using interventional techniques. 2.  End-stage renal disease requiring hemodialysis:  Patient will continue dialysis therapy without further interruption. 3.  Hypertension:  Patient will continue medical management; nephrology is following no changes in oral medications.   Shanessa Hodak, Latina Craver, MD  05/09/2016 10:41 AM

## 2016-05-10 ENCOUNTER — Encounter: Payer: Self-pay | Admitting: Vascular Surgery

## 2016-05-23 ENCOUNTER — Other Ambulatory Visit (HOSPITAL_COMMUNITY): Payer: Self-pay | Admitting: General Surgery

## 2016-05-23 DIAGNOSIS — N2581 Secondary hyperparathyroidism of renal origin: Secondary | ICD-10-CM

## 2016-05-25 ENCOUNTER — Telehealth: Payer: Self-pay | Admitting: *Deleted

## 2016-05-25 NOTE — Telephone Encounter (Signed)
Faxed cardiac clearance to [facility] for an upcoming parathyroidectomy procedure.  [place] advises: Intermediate risk for intermediate risk procedure.  Limit fluids if possible. Continue Coreg through procedure.

## 2016-05-29 ENCOUNTER — Ambulatory Visit (HOSPITAL_COMMUNITY)
Admission: RE | Admit: 2016-05-29 | Discharge: 2016-05-29 | Disposition: A | Discharge status: Home / Self Care | Payer: Medicare Other | Source: Ambulatory Visit | Attending: General Surgery | Admitting: General Surgery

## 2016-05-29 ENCOUNTER — Ambulatory Visit: Payer: Self-pay | Admitting: General Surgery

## 2016-05-29 ENCOUNTER — Encounter (HOSPITAL_COMMUNITY): Admission: RE | Admit: 2016-05-29 | Payer: Medicare Other | Source: Ambulatory Visit

## 2016-05-29 DIAGNOSIS — R938 Abnormal findings on diagnostic imaging of other specified body structures: Secondary | ICD-10-CM | POA: Diagnosis not present

## 2016-05-29 DIAGNOSIS — N2581 Secondary hyperparathyroidism of renal origin: Secondary | ICD-10-CM

## 2016-05-29 MED ORDER — TECHNETIUM TC 99M SESTAMIBI - CARDIOLITE
25.7000 | Freq: Once | INTRAVENOUS | Status: AC | PRN
  Administered 2016-05-29: 10:00:00 26 via INTRAVENOUS

## 2016-05-29 NOTE — H&P (Signed)
History of Present Illness [name] Filler MD; 05/18/2016 11:24 AM) Patient words: parathyroid.  The patient is a 25 year old female who presents with secondary hyperparathyroidism. Patient is a 25 year old female who is referred by Dr. Keane Scrape for evaluation of secondary hyperparathyroidism. Patient is a [place] past medical history. She states that she has a diagnosis of polycystic kidney disease. Patient underwent transplant in [place]. This graft failed and was excised. Patient currently on dialysis. She currently takes dialysis Monday Wednesday Friday. Patient has continued with hyperparathyroidism. She states she has a pelvic fracture which she states is nonhealing. Patient has had elevated calciums. Most recently 10.8. Patient's phosphorus is 7.9. Patient is on Sensipar however has low tolerance. Patient's most recent PTH level is greater than 3800.  Patient has a history of PE. Had a previous IVC filter placed. Is on Elliquis. Has an AICD. Patient sees Dr. Elberta Fortis Patient has had a left upper extremity AV fistula placed. She has a failed right upper AV fistula   Other Problems Ginnie Smart, CMA; 05/18/2016 10:45 AM) Anxiety Disorder Chest pain Chronic Renal Failure Syndrome Congestive Heart Failure Depression Heart murmur High blood pressure Pulmonary Embolism / Blood Clot in Legs Sickle cell disease  Past Surgical History Ginnie Smart, CMA; 05/18/2016 10:45 AM) Aneurysm Repair Dialysis Shunt / Fistula Nephrectomy Right. Tonsillectomy  Diagnostic Studies History Ginnie Smart, [place]; 05/18/2016 10:45 AM) Colonoscopy never Mammogram within last year Pap Smear 1-5 years ago  Allergies Ginnie Smart, CMA; 05/18/2016 10:47 AM) Aspirin *ANALGESICS - NonNarcotic* Buprenorphine *ANALGESICS - OPIOID* Hydrocodone-Acetaminophen *ANALGESICS - OPIOID* Iohexol *DIAGNOSTIC PRODUCTS* Morphine Sulfate (Concentrate) *ANALGESICS -  OPIOID* Piperacillin Sodium *PENICILLINS* TraMADol HCl *ANALGESICS - OPIOID*  Medication History Ginnie Smart, CMA; 05/18/2016 10:50 AM) Carvedilol (6.25MG  Tablet, Oral) Active. Apixaban (  Tablet, Oral) Active. LORazepam (  Tablet, Oral) Active. OxyCODONE HCl (  Tablet, Oral) Active. AmLODIPine Besylate (  Tablet, Oral) Active. HydrALAZINE HCl (  Tablet, Oral) Active. Cinacalcet HCl (  Tablet, Oral) Active. Sertraline HCl (  Tablet, Oral) Active. Isosorbide Dinitrate (  Tablet, Oral) Active. Multi Vitamin (Oral) Active. Sevelamer Carbonate (  Tablet, Oral) Active. Medications Reconciled  Social History Ginnie Smart, [place]; 05/18/2016 10:45 AM) Alcohol use Remotely quit alcohol use. No caffeine use No drug use Tobacco use Former smoker.  Family History Ginnie Smart, [place]; 05/18/2016 10:45 AM) Hypertension Father. Ischemic Bowel Disease Mother. Kidney Disease Father.  Pregnancy / Birth History Ginnie Smart, [place]; 05/18/2016 10:45 AM) Age at menarche 17 years. Gravida 0 Irregular periods Para 0    Review of Systems [employer]. Brooks CMA; 05/18/2016 10:45 AM) General Not Present- Appetite Loss, Chills, Fatigue, Fever, Night Sweats, Weight Gain and Weight Loss. Skin Present- Dryness. Not Present- Change in Wart/Mole, Hives, Jaundice, New Lesions, Non-Healing Wounds, Rash and Ulcer. HEENT Not Present- Earache, Hearing Loss, Hoarseness, Nose Bleed, Oral Ulcers, Ringing in the Ears, Seasonal Allergies, Sinus Pain, Sore Throat, Visual Disturbances, Wears glasses/contact lenses and Yellow Eyes. Respiratory Not Present- Bloody sputum, Chronic Cough, Difficulty Breathing, Snoring and Wheezing. Breast Not Present- Breast Mass, Breast Pain, Nipple Discharge and Skin Changes. Cardiovascular Not Present- Chest Pain, Difficulty Breathing Lying Down, Leg Cramps, Palpitations, Rapid Heart Rate, Shortness of Breath and  Swelling of Extremities. Gastrointestinal Not Present- Abdominal Pain, Bloating, Bloody Stool, Change in Bowel Habits, Chronic diarrhea, Constipation, Difficulty Swallowing, Excessive gas, Gets full quickly at meals, Hemorrhoids, Indigestion, Nausea, Rectal Pain and Vomiting. Female Genitourinary Present- Pelvic Pain. Not Present- Frequency, Nocturia, Painful Urination and Urgency. Musculoskeletal Present- Joint Pain. Not  Present- Back Pain, Joint Stiffness, Muscle Pain, Muscle Weakness and Swelling of Extremities. Neurological Present- Trouble walking and Weakness. Not Present- Decreased Memory, Fainting, Headaches, Numbness, Seizures, Tingling and Tremor. Psychiatric Present- Anxiety, Change in Sleep Pattern and Depression. Not Present- Bipolar, Fearful and Frequent crying. Endocrine Not Present- Cold Intolerance, Excessive Hunger, Hair Changes, Heat Intolerance, Hot flashes and New Diabetes. Hematology Present- Blood Thinners. Not Present- Easy Bruising, Excessive bleeding, Gland problems, HIV and Persistent Infections.  Vitals [employer]. Brooks CMA; 05/18/2016 10:42 AM) 05/18/2016 10:41 AM Weight: 126.38 lb Height: 63in Body Surface Area: 1.59 m Body Mass Index: 22.39 kg/m  BP: 138/90 (Sitting, Left Arm, Standard)       Physical Exam [name] Filler MD; 05/18/2016 11:25 AM) General Mental Status-Alert. General Appearance-Consistent with stated age. Hydration-Well hydrated. Voice-Normal.  Head and Neck Note: No palpable lymph nodes or masses   Chest and Lung Exam Chest and lung exam reveals -quiet, even and easy respiratory effort with no use of accessory muscles and on auscultation, normal breath sounds, no adventitious sounds and normal vocal resonance. Inspection Chest Wall - Normal. Back - normal.  Cardiovascular Cardiovascular examination reveals -normal heart sounds, regular rate and rhythm with no murmurs and normal pedal pulses  bilaterally. Note: Left AV fistula with thrill   Abdomen Inspection Inspection of the abdomen reveals - No Hernias. Skin - Scar - no surgical scars. Palpation/Percussion Palpation and Percussion of the abdomen reveal - Soft, Non Tender, No Rebound tenderness, No Rigidity (guarding) and No hepatosplenomegaly. Auscultation Auscultation of the abdomen reveals - Bowel sounds normal.    Assessment & Plan [name] Filler MD; 05/18/2016 11:27 AM) SECONDARY HYPERPARATHYROIDISM, RENAL (N25.81) Impression: Patient is a 25 year old female workup. Past medical history to include CHF, secondary hyperparathyroidism, end-stage renal disease on dialysis.  Secondary to the patient's hyperparathyroidism and hypercalcemia patient is a candidate for subtotal parathyroidectomy. I discussed with the patient this would require removing all 4 parathyroid glands and reimplanting one half portion of that in her right forearm. I discussed with the patient the patient will require hospitalization for medical care thereafter.  I discussed with her the risks and benefits the procedure to include but not limited to: Infection, bleeding, damage to structures, possible need for tracheostomy, possibly further surgery. The patient was understanding and wishes to proceed.  The patient will require to be off Elliquis. She will also require cardiac clearance secondary her CHF. We'll discuss this with Dr. Elberta Fortis as well as her nephrologist. Patient will require admission by nephrology postoperatively.

## 2016-06-03 ENCOUNTER — Encounter (HOSPITAL_BASED_OUTPATIENT_CLINIC_OR_DEPARTMENT_OTHER): Payer: Self-pay | Admitting: *Deleted

## 2016-06-03 ENCOUNTER — Other Ambulatory Visit: Payer: Self-pay

## 2016-06-03 ENCOUNTER — Emergency Department (HOSPITAL_BASED_OUTPATIENT_CLINIC_OR_DEPARTMENT_OTHER): Payer: Medicare Other

## 2016-06-03 ENCOUNTER — Emergency Department (HOSPITAL_BASED_OUTPATIENT_CLINIC_OR_DEPARTMENT_OTHER)
Admission: EM | Admit: 2016-06-03 | Discharge: 2016-06-03 | Disposition: A | Discharge status: Home / Self Care | Payer: Medicare Other | Attending: Emergency Medicine | Admitting: Emergency Medicine

## 2016-06-03 DIAGNOSIS — Z79899 Other long term (current) drug therapy: Secondary | ICD-10-CM | POA: Diagnosis not present

## 2016-06-03 DIAGNOSIS — Y9389 Activity, other specified: Secondary | ICD-10-CM | POA: Insufficient documentation

## 2016-06-03 DIAGNOSIS — Y929 Unspecified place or not applicable: Secondary | ICD-10-CM | POA: Insufficient documentation

## 2016-06-03 DIAGNOSIS — R079 Chest pain, unspecified: Secondary | ICD-10-CM

## 2016-06-03 DIAGNOSIS — S2232XA Fracture of one rib, left side, initial encounter for closed fracture: Secondary | ICD-10-CM | POA: Diagnosis not present

## 2016-06-03 DIAGNOSIS — I509 Heart failure, unspecified: Secondary | ICD-10-CM | POA: Diagnosis not present

## 2016-06-03 DIAGNOSIS — Z87891 Personal history of nicotine dependence: Secondary | ICD-10-CM | POA: Insufficient documentation

## 2016-06-03 DIAGNOSIS — Z992 Dependence on renal dialysis: Secondary | ICD-10-CM | POA: Insufficient documentation

## 2016-06-03 DIAGNOSIS — N186 End stage renal disease: Secondary | ICD-10-CM | POA: Insufficient documentation

## 2016-06-03 DIAGNOSIS — W2201XA Walked into wall, initial encounter: Secondary | ICD-10-CM | POA: Insufficient documentation

## 2016-06-03 DIAGNOSIS — S299XXA Unspecified injury of thorax, initial encounter: Secondary | ICD-10-CM | POA: Diagnosis present

## 2016-06-03 DIAGNOSIS — I132 Hypertensive heart and chronic kidney disease with heart failure and with stage 5 chronic kidney disease, or end stage renal disease: Secondary | ICD-10-CM | POA: Insufficient documentation

## 2016-06-03 DIAGNOSIS — Y999 Unspecified external cause status: Secondary | ICD-10-CM | POA: Insufficient documentation

## 2016-06-03 MED ORDER — LIDOCAINE 5 % EX PTCH: 1.0000 | MEDICATED_PATCH | CUTANEOUS | 0 refills | Status: DC

## 2016-06-03 MED ORDER — OXYCODONE-ACETAMINOPHEN 5-325 MG PO TABS
2.0000 | ORAL_TABLET | Freq: Once | ORAL | Status: AC
  Administered 2016-06-03: 2 via ORAL
  Filled 2016-06-03: qty 2

## 2016-06-03 NOTE — Discharge Instructions (Signed)
Take your medications as prescribed. Continue taking your home medications as prescribed and going to hemodialysis treatments as scheduled. I recommend following up with your family doctor in the next week for follow-up. Return to the emergency department if symptoms worsen or new onset of fever, worsening/new chest pain, shortness of breath, productive cough, coughing up blood, lightheadedness, dizziness, syncope.

## 2016-06-03 NOTE — ED Notes (Signed)
Pt given d/c instructions as per chart. Rx x1. Verbalizes understanding. No questions. Instructed not to drive. 

## 2016-06-03 NOTE — ED Notes (Signed)
Pt asking for pain medication. PA-C notified.
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
## 2016-06-03 NOTE — ED Notes (Signed)
Pt reports taking 2 Percocet around 1530 with no relief.
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
## 2016-06-06 ENCOUNTER — Ambulatory Visit (INDEPENDENT_AMBULATORY_CARE_PROVIDER_SITE_OTHER): Payer: Medicare Other | Admitting: Family Medicine

## 2016-06-06 ENCOUNTER — Encounter: Payer: Self-pay | Admitting: Family Medicine

## 2016-06-06 DIAGNOSIS — F411 Generalized anxiety disorder: Secondary | ICD-10-CM | POA: Diagnosis not present

## 2016-06-06 DIAGNOSIS — Z992 Dependence on renal dialysis: Secondary | ICD-10-CM

## 2016-06-06 DIAGNOSIS — G894 Chronic pain syndrome: Secondary | ICD-10-CM | POA: Diagnosis not present

## 2016-06-06 DIAGNOSIS — N186 End stage renal disease: Secondary | ICD-10-CM

## 2016-06-06 MED ORDER — OXYCODONE HCL 5 MG PO TABS: 5.0000 mg | ORAL_TABLET | Freq: Four times a day (QID) | ORAL | 0 refills | Status: DC | PRN

## 2016-06-06 MED ORDER — LORAZEPAM 1 MG PO TABS: 1.0000 mg | ORAL_TABLET | Freq: Every day | ORAL | 0 refills | Status: DC | PRN

## 2016-06-06 MED FILL — oxyCODONE HCL 5 MG TABS: 5 | 12 days supply | Qty: 45 | Fill #0

## 2016-06-06 MED FILL — LORazepam 1 MG TABS: 1 | 30 days supply | Qty: 30 | Fill #0

## 2016-06-06 NOTE — Progress Notes (Signed)
[name] Small, is a 25 y.o. female  ZOX:[id number]:[id number]  [id number]:[id number]  DOB - 1990-11-03  CC:  Chief Complaint  Patient presents with  . Follow-up  . Medication Refill    ativan and oxycodone.        HPI: [name] Small is a 25 y.o. female here for follow-up. She has a history of PCKD with ESRD and is on dialysis. She also has a diagnosis of cardiomyopathy, CHF, AICD and is followed by cardiology. She has significant bone pain and anxiety and has been prescribed Oxycodone and Ativan per Dr. Hyman HopesJegede. Her main reason for being here today is to get refills on these. She reports her pain is increasing in frequency. She is taking oxycodone about 4 times a day.   Allergies  Allergen Reactions  . Aspirin Other (See Comments)    Interacts with Coreg  . Tramadol Anaphylaxis  . Vicodin [Hydrocodone-Acetaminophen] Hives  . Buprenorphine Hcl Itching and Other (See Comments)    Ok with oxycodone  . Iohexol Itching  . Morphine And Related Itching and Other (See Comments)    Ok with oxycodone   Past Medical History:  Diagnosis Date  . AICD (automatic cardioverter/defibrillator) present 12/16/14   [employer]  . Anemia   . Anxiety   . Cardiomyopathy   . Cellulitis and abscess of face 03/22/2013  . CHF (congestive heart failure) (HCC)   . Chronic anticoagulation   . Chronic pain   . Depression   . Dysrhythmia    at times per pt.  . 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 ESRD (end stage renal disease) on dialysis [facility](HCC)    "MWF; [facility]" (04/11/2016)  . GERD (gastroesophageal reflux disease)   . H/O transfusion of packed red blood cells   . Hemodialysis patient (HCC)    Mon. Wed. Fri  . Hyperkalemia 09/2015  . Hypertension   . Narcotic abuse, continuous   . Osteoporosis   . Pelvic fracture (HCC) 06/2015   "on the right"  . Polycystic kidney disease   .  Pulmonary emboli (HCC) 01/2012   Bilateral, moderate clot burden, areas of pulmonary infarction and central necrosis  . Renal insufficiency   . Sickle cell anemia (HCC)    Current Outpatient Prescriptions on File Prior to Visit  Medication Sig Dispense Refill  . amLODipine (NORVASC) 5 MG tablet Take 1 tablet (5 mg total) by mouth daily. 30 tablet 3  . apixaban (ELIQUIS) 5 MG TABS tablet Take 1 tablet (5 mg total) by mouth 2 (two) times daily. From 04/09/16 60 tablet 0  . carvedilol (COREG) 6.25 MG tablet Take 6.25 mg by mouth 2 (two) times daily with a meal.   3  . cinacalcet (SENSIPAR) 30 MG tablet Take 1 tablet (30 mg total) by mouth daily with breakfast. (Patient taking differently: Take 30 mg by mouth daily with supper. ) 90 tablet 3  . hydrALAZINE (APRESOLINE) 25 MG tablet Take 1 tablet (25 mg total) by mouth 3 (three) times daily. 90 tablet 3  . isosorbide dinitrate (ISORDIL) 10 MG tablet Take 1 tablet (10 mg total) by mouth 3 (three) times daily. 90 tablet 3  . lidocaine (LIDODERM) 5 % Place 1 patch onto the skin daily. Remove & Discard patch within 12 hours or as directed by MD 7 patch 0  . multivitamin (RENA-VIT) TABS tablet Take 1 tablet by mouth daily. 90 tablet 3  .  sertraline (ZOLOFT) 100 MG tablet Take 1 tablet (100 mg total) by mouth daily. 30 tablet 3  . sevelamer carbonate (RENVELA) 800 MG tablet Take 2-3 tablets (1,600-2,400 mg total) by mouth 3 (three) times daily with meals. 3 tabs with meals, 2 tabs with snacks 120 tablet 3   No current facility-administered medications on file prior to visit.    Family History  Problem Relation Age of Onset  . Polycystic kidney disease Father   . Hypertension Father    Social History   Social History  . Marital status: Single    Spouse name: N/A  . Number of children: N/A  . Years of education: N/A   Occupational History  . Not on file.   Social History Main Topics  . Smoking status: Former Smoker    Packs/day: 0.00     Years: 1.00    Types: Cigarettes    Quit date: 12/08/2015  . Smokeless tobacco: Never Used  . Alcohol use No  . Drug use: No  . Sexual activity: Not Currently    Birth control/ protection: None   Other Topics Concern  . Not on file   Social History Narrative   Was smoking 3 cigs per day. Lives at home with roommates, has some family locally.     Review of Systems: Constitutional: Negative for fever, chills, appetite change, weight loss. Positive for fatigue Skin: Negative for rashes or lesions of concern. HENT: Negative for ear pain, ear discharge.nose bleeds Eyes: Negative for pain, discharge, redness, itching and visual disturbance. Neck: Negative for pain, stiffness Respiratory: Negative for cough, shortness of breath,   Cardiovascular: Negative for chest pain, palpitations. Positive for occ sharp chest pain. Positive for pedal edema Gastrointestinal: Negative for abdominal pain, nausea, diarrhea, constipations. Positive for occ nausea Genitourinary: Anuria Musculoskeletal: Positive for chronic pain in hips, knees, pelvis Neurological: Negative for dizziness, tremors, seizures, syncope,   light-headedness, numbness and headaches.  Hematological: Negative for easy bruising or bleeding Psychiatric/Behavioral: Negative for depression, anxiety, decreased concentration, confusion   Objective:   Vitals:   06/06/16 0929  BP: (!) 122/98  Pulse: 68  Resp: 16  Temp: 98.2 F (36.8 C)    Physical Exam: Constitutional: Patient appears well-developed and well-nourished. No distress. Face puffy HENT: Normocephalic, atraumatic, External right and left ear normal. Oropharynx is clear and moist.  Eyes: Conjunctivae and EOM are normal. PERRLA, no scleral icterus. Neck: Normal ROM. Neck supple. No lymphadenopathy, No thyromegaly. CVS: RRR, S1/S2 +, no murmurs, no gallops, no rubs. Bilateral pedal edema present Pulmonary: Effort and breath sounds normal, no stridor, rhonchi, wheezes,  rales.  Abdominal: Soft. Normoactive BS,, no distension, tenderness, rebound or guarding.  Musculoskeletal: Normal range of motion. No edema and no tenderness.  Neuro: Alert.Normal muscle tone coordination. Non-focal Skin: Skin is warm and dry. No rash noted. Not diaphoretic. No erythema. No pallor. Psychiatric: Normal mood and affect. Behavior, judgment, thought content normal.  Lab Results  Component Value Date   WBC 8.0 04/13/2016   HGB 10.1 (L) 04/13/2016   HCT 32.8 (L) 04/13/2016   MCV 91.6 04/13/2016   PLT 231 04/13/2016   Lab Results  Component Value Date   CREATININE 7.43 (H) 04/14/2016   BUN 40 (H) 04/14/2016   NA 135 04/14/2016   K 4.1 04/14/2016   CL 97 (L) 04/14/2016   CO2 24 04/14/2016    Lab Results  Component Value Date   HGBA1C 5.8 (H) 01/10/2016   Lipid Panel     Component  Value Date/Time   CHOL 188 04/03/2015 0313   TRIG 167 (H) 04/03/2015 0313   HDL 51 04/03/2015 0313   CHOLHDL 3.7 04/03/2015 0313   VLDL 33 04/03/2015 0313   LDLCALC 104 (H) 04/03/2015 0313       Assessment and plan:   1. ESRD on dialysis (HCC)  - LORazepam (ATIVAN) 1 MG tablet; Take 1 tablet (1 mg total) by mouth daily as needed for anxiety.  Dispense: 30 tablet; Refill: 0  2. Generalized anxiety disorder  - LORazepam (ATIVAN) 1 MG tablet; Take 1 tablet (1 mg total) by mouth daily as needed for anxiety.  Dispense: 30 tablet; Refill: 0  3. Chronic pain syndrome - oxyCODONE (OXY IR/ROXICODONE) 5 MG immediate release tablet; Take 1 tablet (5 mg total) by mouth every 6 (six) hours as needed for severe pain.  Dispense: 45 tablet; Refill: 0   Return in about 2 months (around 08/06/2016).  The patient was given clear instructions to go to ER or return to medical center if symptoms don't improve, worsen or new problems develop. The patient verbalized understanding.    Henrietta Hoover FNP  06/06/2016, 11:18 AM

## 2016-06-06 NOTE — Patient Instructions (Signed)
Follow-up with Dr. Hyman Hopes in 2 months.

## 2016-06-15 ENCOUNTER — Encounter (HOSPITAL_COMMUNITY): Payer: Self-pay | Admitting: Vascular Surgery

## 2016-06-15 ENCOUNTER — Inpatient Hospital Stay (HOSPITAL_COMMUNITY)
Admission: RE | Admit: 2016-06-15 | Discharge: 2016-06-15 | Disposition: A | Discharge status: Home / Self Care | Payer: Medicare Other | Source: Ambulatory Visit

## 2016-06-15 NOTE — Pre-Procedure Instructions (Signed)
    [name] Small  06/15/2016     Your procedure is scheduled on Tuesday, August 29.  Report to [facility] Admitting at 9:15 A.M.              Your surgery or procedure is scheduled for 11:15 AM   Call this number if you have problems the morning of surgery:[phone]   Remember:  Do not eat food or drink liquids after midnight Monday, August 28  Take these medicines the morning of surgery with A SIP OF WATER:amLODipine (NORVASC), carvedilol (COREG), hydrALAZINE (APRESOLINE), isosorbide dinitrate (ISORDIL), sertraline (ZOLOFT).              Take if needed:LORazepam (ATIVAN), oxyCODONE (OXY IR/ROXICODONE).                    Stop Multivitaminn today.                   Hold Eliquis as instructed by Dr Derrell Lolling.     Do not wear jewelry, make-up or nail polish.  Do not wear lotions, powders, or perfumes, or deodorant.  Do not shave 48 hours prior to surgery.    Do not bring valuables to the hospital.  [facility] is not responsible for any belongings or valuables.  Contacts, dentures or bridgework may not be worn into surgery.  Leave your suitcase in the car.  After surgery it may be brought to your room.  For patients admitted to the hospital, discharge time will be determined by your treatment team.  Special instructions:  Review  [place] - Preparing For Surgery.  Please read over the following fact sheets that you were given: [facility]- Preparing For Surgery , Coughing and Deep Breathing

## 2016-06-15 NOTE — Progress Notes (Signed)
Anesthesia Chart Review: Patient is a 25 year old female scheduled for parathyroidectomy on 06/20/16 by Dr. Derrell Lollingamirez. [name] is scheduled for 06/15/16 at 9:15AM, but was a no show.   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 Elspeth ChoKevin Hau, [facility]), dysrhythmia, HTN, Sickle cell anemia, PE 01/2012, s/p IVC filter, depression, anxiety, chronic pain, anemia, GERD, former smoker (quit 12/08/15). S/P Gengraf revision of left brachiobasilic AV fistula with 7mm Artegraft with ligation of aneurysmal left brachiobasilic AV fistula 03/22/16.  Ad[facility] 03/31/16-04/02/16 with hyperkalemia, accelerated HTN, and LUE swelling with concern for AVF infection. Antibiotic given, but vascular (Dr. Festus BarrenJason Dew) had low suspicion for infection. LUE venous duplex ordered and showed extensive occlusive thrombus in the left IJ vein. Dr. Wyn Quakerew recommended oral anticoagulation for at least 3 months, but no intervention planned (although in the future a fistulogram with further evaluation and treatment may be helpful once arm swelling resolved).  PCP is listed as Dr. Jeanann Lewandowskylugbemiga Jegede. [facility] on 06/06[facility]16 by Concepcion LivingLinda Bernhardt, NP.  Nephrologist is Dr. Thedore MinsSingh.  Primary cardiologist is Dr. Elberta Fortisamnitz, last office visit 03/21/16 with normal device function. According to 05/25/16 telephone encounter by Samuella CotaPrice, RN, "Camnitz advises: Intermediate risk for intermediate risk procedure.  Limit fluids if possible. Continue Coreg through procedure." Last seen by Dr. Peter SwazilandJordan on 04/14/16 during admission for pleuritic chest pain. CT negative for PE. EKG stable. Small posterior effusion on echo treated with empiric trial of colchicine. Has chronic elevated troponin felt most related to ESRD and not consistent  with ischemia. No further recommendations made at that point. Continue Coreg, hydralazine, Imdur for non-ischemic cardiomyopathy.  Meds include amlodipine, Eliquis, Coreg, hydralazine, isosorbide dinitrate, Ativan, OxyIR, Zoloft, Renvela, Lidoderm.  06/03/16 EKG: Sr with first degree AV Block. T wave abnormality, consider lateral ischemia. Prolonged QT (QT 398, QTc 533 ms). Has known lateral T wave abnormality dating back to > 1 year ago.  04/12/16 Echo: Study Conclusions - Left ventricle: The estimated ejection fraction was 20%. Diffuse hypokinesis. Doppler parameters are consistent with both elevated ventricular end-diastolic filling pressure and elevated left atrial filling pressure. - Mitral valve: There was mild regurgitation. - Left atrium: The atrium was moderately dilated. - Right atrium: The atrium was mildly dilated. - Atrial septum: No defect or patent foramen ovale was identified. - Pericardium, extracardiac: Small mostly posterior pericardial effusion. (Comparison EF: 20-25% 10/29/15, 04/03/15; 35-40% 02/22/15; 20% 02/21/12.)  11/23/15 Nuclear stress test ([facility]; Care Everywhere): IMPRESSION: 1. Normaland good quality SPECTpharmacologic Cardiolite study without convincing evidence for infarction or ischemia. This study is most consistent with a nonischemic dilated cardiomyopathy. This is a high risk study due to the LV dysfunction  2. Dilated left ventricle with severe dysfunction. EF was quantitated at 25%. The pattern is more global than regional. 3. When compared to a prior study from October 2015, I do not appreciate any anterior defect.  08/04/14 Cardiac cath [facility]; Care Everywhere): SUMMARY: 1. Severely reduced LVEF ~ 20 with mildly dilated LV and global severe hypokinesis. 2. Widely patent cronaries without significant obstruction. 3. Markedly elevated LVEDP.   06/03/16 DG Ribs Unilateral w/Chest left: FINDINGS: - RIGHT subclavian AICD with lead projecting  over RIGHT ventricle. - Enlargement of cardiac silhouette, unable to exclude pericardial effusion with this configuration. - Pulmonary vascular congestion. - Subsegmental atelectasis lingula, chronic. -  No acute infiltrate, pleural effusion or pneumothorax. - BB placed at site of symptoms lower lateral LEFT chest. - Osseous demineralization. - No definite acute rib fracture or bone destruction. - Slight deformity and suspected callus seen at LEFT seventh rib question subacute versus old fracture ; this does not correspond to the site of symptoms. IMPRESSION: Enlargement of cardiac silhouette post AICD, unable to exclude pericardial fusion with disc cardiac configuration. Question subacute versus old fracture of LEFT seventh rib.  04/12/16 Chest CTA: IMPRESSION: 1. No evidence of pulmonary embolus. 2. Trace right-sided pleural effusion, with mild bibasilar atelectasis. 3. Significant cardiomegaly.  Trace pericardial fluid noted. 4. Small volume ascites within the upper abdomen. 5. Mild bilateral renal atrophy and scarring, with scattered vague hypodense and hyperdense cystic foci in both kidneys.  [name] RN Jan to contact [facility] to notify staff that patient was a no show for her [name] visit. Currently, we do not have any additional appointments available. [facility] note from 05/18/16 (scanned under Media tab) also mentions that Dr. Derrell Lolling was going to request cardiac clearance (received) and discuss as well with nephrology. Will also need to clarify who has given permission to hold Eliquis since vascular surgery initially recommended at least three months on Eliquis (DVT 04/01/16). (Update 06/15/16 12:31PM: I have communicated with Dr. Derrell Lolling, he will likely postpone surgery until after 07/02/16.)  Shonna Chock, Cordelia Poche [facility] Short Stay Center/Anesthesiology Phone [phone] 06/15/2016 11:06 AM

## 2016-06-15 NOTE — Progress Notes (Signed)
Patient did no come to [name] appointment.  I called her number 3 times, there was no answer. I left a message on voice mail. Allizion Z is reviewing her chart.  I called [facility] and spoke with [facility] and asked for cardiac clearance and instructions on when to stop Eliquis.  Patient was diagnosed with new DVT in June 2017.

## 2016-06-17 ENCOUNTER — Other Ambulatory Visit: Payer: Self-pay

## 2016-06-17 ENCOUNTER — Emergency Department (HOSPITAL_COMMUNITY): Payer: Medicare Other

## 2016-06-17 ENCOUNTER — Inpatient Hospital Stay (HOSPITAL_COMMUNITY)
Admission: EM | Admit: 2016-06-17 | Discharge: 2016-06-19 | DRG: 291 | Disposition: A | Discharge status: Home / Self Care | Payer: Medicare Other | Attending: Internal Medicine | Admitting: Internal Medicine

## 2016-06-17 ENCOUNTER — Inpatient Hospital Stay (HOSPITAL_COMMUNITY): Payer: Medicare Other

## 2016-06-17 ENCOUNTER — Encounter (HOSPITAL_COMMUNITY): Payer: Self-pay | Admitting: Emergency Medicine

## 2016-06-17 DIAGNOSIS — D571 Sickle-cell disease without crisis: Secondary | ICD-10-CM | POA: Diagnosis present

## 2016-06-17 DIAGNOSIS — R079 Chest pain, unspecified: Secondary | ICD-10-CM | POA: Diagnosis present

## 2016-06-17 DIAGNOSIS — I82402 Acute embolism and thrombosis of unspecified deep veins of left lower extremity: Secondary | ICD-10-CM

## 2016-06-17 DIAGNOSIS — I313 Pericardial effusion (noninflammatory): Secondary | ICD-10-CM | POA: Diagnosis present

## 2016-06-17 DIAGNOSIS — Z888 Allergy status to other drugs, medicaments and biological substances status: Secondary | ICD-10-CM | POA: Diagnosis not present

## 2016-06-17 DIAGNOSIS — Z86718 Personal history of other venous thrombosis and embolism: Secondary | ICD-10-CM

## 2016-06-17 DIAGNOSIS — I2699 Other pulmonary embolism without acute cor pulmonale: Secondary | ICD-10-CM

## 2016-06-17 DIAGNOSIS — D631 Anemia in chronic kidney disease: Secondary | ICD-10-CM | POA: Diagnosis present

## 2016-06-17 DIAGNOSIS — Z7901 Long term (current) use of anticoagulants: Secondary | ICD-10-CM

## 2016-06-17 DIAGNOSIS — N185 Chronic kidney disease, stage 5: Secondary | ICD-10-CM | POA: Diagnosis not present

## 2016-06-17 DIAGNOSIS — N186 End stage renal disease: Secondary | ICD-10-CM | POA: Diagnosis present

## 2016-06-17 DIAGNOSIS — Z87891 Personal history of nicotine dependence: Secondary | ICD-10-CM

## 2016-06-17 DIAGNOSIS — Z885 Allergy status to narcotic agent status: Secondary | ICD-10-CM

## 2016-06-17 DIAGNOSIS — Z79899 Other long term (current) drug therapy: Secondary | ICD-10-CM | POA: Diagnosis not present

## 2016-06-17 DIAGNOSIS — Z992 Dependence on renal dialysis: Secondary | ICD-10-CM | POA: Diagnosis not present

## 2016-06-17 DIAGNOSIS — G894 Chronic pain syndrome: Secondary | ICD-10-CM | POA: Diagnosis present

## 2016-06-17 DIAGNOSIS — K219 Gastro-esophageal reflux disease without esophagitis: Secondary | ICD-10-CM | POA: Diagnosis present

## 2016-06-17 DIAGNOSIS — Z86711 Personal history of pulmonary embolism: Secondary | ICD-10-CM

## 2016-06-17 DIAGNOSIS — I10 Essential (primary) hypertension: Secondary | ICD-10-CM | POA: Diagnosis present

## 2016-06-17 DIAGNOSIS — I82C19 Acute embolism and thrombosis of unspecified internal jugular vein: Secondary | ICD-10-CM | POA: Diagnosis present

## 2016-06-17 DIAGNOSIS — Z8271 Family history of polycystic kidney: Secondary | ICD-10-CM

## 2016-06-17 DIAGNOSIS — I429 Cardiomyopathy, unspecified: Secondary | ICD-10-CM | POA: Diagnosis present

## 2016-06-17 DIAGNOSIS — M810 Age-related osteoporosis without current pathological fracture: Secondary | ICD-10-CM | POA: Diagnosis present

## 2016-06-17 DIAGNOSIS — Z95810 Presence of automatic (implantable) cardiac defibrillator: Secondary | ICD-10-CM | POA: Diagnosis not present

## 2016-06-17 DIAGNOSIS — Z886 Allergy status to analgesic agent status: Secondary | ICD-10-CM

## 2016-06-17 DIAGNOSIS — Z905 Acquired absence of kidney: Secondary | ICD-10-CM

## 2016-06-17 DIAGNOSIS — I132 Hypertensive heart and chronic kidney disease with heart failure and with stage 5 chronic kidney disease, or end stage renal disease: Principal | ICD-10-CM | POA: Diagnosis present

## 2016-06-17 DIAGNOSIS — F418 Other specified anxiety disorders: Secondary | ICD-10-CM | POA: Diagnosis present

## 2016-06-17 DIAGNOSIS — I5023 Acute on chronic systolic (congestive) heart failure: Secondary | ICD-10-CM | POA: Diagnosis present

## 2016-06-17 DIAGNOSIS — Z8249 Family history of ischemic heart disease and other diseases of the circulatory system: Secondary | ICD-10-CM | POA: Diagnosis not present

## 2016-06-17 DIAGNOSIS — E8770 Fluid overload, unspecified: Secondary | ICD-10-CM

## 2016-06-17 DIAGNOSIS — N2581 Secondary hyperparathyroidism of renal origin: Secondary | ICD-10-CM | POA: Diagnosis present

## 2016-06-17 DIAGNOSIS — Z9119 Patient's noncompliance with other medical treatment and regimen: Secondary | ICD-10-CM

## 2016-06-17 DIAGNOSIS — I5022 Chronic systolic (congestive) heart failure: Secondary | ICD-10-CM

## 2016-06-17 LAB — CBC
HEMATOCRIT: 37.1 % (ref 36.0–46.0)
Hemoglobin: 11.7 g/dL — ABNORMAL LOW (ref 12.0–15.0)
MCH: 29 pg (ref 26.0–34.0)
MCHC: 31.5 g/dL (ref 30.0–36.0)
MCV: 92.1 fL (ref 78.0–100.0)
Platelets: 206 10*3/uL (ref 150–400)
RBC: 4.03 MIL/uL (ref 3.87–5.11)
RDW: 18.4 % — AB (ref 11.5–15.5)
WBC: 7.2 10*3/uL (ref 4.0–10.5)

## 2016-06-17 LAB — I-STAT CHEM 8, ED
BUN: 72 mg/dL — ABNORMAL HIGH (ref 6–20)
CHLORIDE: 99 mmol/L — AB (ref 101–111)
Calcium, Ion: 1.14 mmol/L (ref 1.13–1.30)
Creatinine, Ser: 9.1 mg/dL — ABNORMAL HIGH (ref 0.44–1.00)
GLUCOSE: 87 mg/dL (ref 65–99)
HEMATOCRIT: 44 % (ref 36.0–46.0)
Hemoglobin: 15 g/dL (ref 12.0–15.0)
POTASSIUM: 4.7 mmol/L (ref 3.5–5.1)
Sodium: 137 mmol/L (ref 135–145)
TCO2: 26 mmol/L (ref 0–100)

## 2016-06-17 LAB — TROPONIN I
TROPONIN I: 0.23 ng/mL — AB (ref <0.03)
Troponin I: 0.25 ng/mL (ref <0.03)
Troponin I: 0.25 ng/mL (ref <0.03)

## 2016-06-17 LAB — PROTIME-INR
INR: 1.35
PROTHROMBIN TIME: 16.8 s — AB (ref 11.4–15.2)

## 2016-06-17 LAB — APTT: APTT: 40 s — AB (ref 24–36)

## 2016-06-17 LAB — BASIC METABOLIC PANEL
Anion gap: 17 — ABNORMAL HIGH (ref 5–15)
BUN: 72 mg/dL — AB (ref 6–20)
CHLORIDE: 97 mmol/L — AB (ref 101–111)
CO2: 24 mmol/L (ref 22–32)
Calcium: 10.6 mg/dL — ABNORMAL HIGH (ref 8.9–10.3)
Creatinine, Ser: 9.83 mg/dL — ABNORMAL HIGH (ref 0.44–1.00)
GFR calc Af Amer: 6 mL/min — ABNORMAL LOW (ref >60)
GFR calc non Af Amer: 5 mL/min — ABNORMAL LOW (ref >60)
GLUCOSE: 93 mg/dL (ref 65–99)
POTASSIUM: 4.7 mmol/L (ref 3.5–5.1)
Sodium: 138 mmol/L (ref 135–145)

## 2016-06-17 LAB — LIPID PANEL
Cholesterol: 137 mg/dL (ref 0–200)
HDL: 58 mg/dL (ref >40)
LDL CALC: 69 mg/dL (ref 0–99)
Total CHOL/HDL Ratio: 2.4 RATIO
Triglycerides: 51 mg/dL (ref <150)
VLDL: 10 mg/dL (ref 0–40)

## 2016-06-17 LAB — I-STAT TROPONIN, ED: Troponin i, poc: 0.22 ng/mL (ref 0.00–0.08)

## 2016-06-17 LAB — HCG, QUANTITATIVE, PREGNANCY: hCG, Beta Chain, Quant, S: 47 m[IU]/mL — ABNORMAL HIGH (ref <5)

## 2016-06-17 MED ORDER — ALUM & MAG HYDROXIDE-SIMETH 200-200-20 MG/5ML PO SUSP
30.0000 mL | ORAL | Status: DC | PRN
  Administered 2016-06-17: 30 mL via ORAL
  Filled 2016-06-17: qty 30

## 2016-06-17 MED ORDER — SEVELAMER CARBONATE 800 MG PO TABS: 1600.0000 mg | ORAL_TABLET | Freq: Three times a day (TID) | ORAL | Status: DC

## 2016-06-17 MED ORDER — CARVEDILOL 6.25 MG PO TABS
6.2500 mg | ORAL_TABLET | Freq: Two times a day (BID) | ORAL | Status: DC
  Administered 2016-06-17 – 2016-06-18 (×3): 6.25 mg via ORAL
  Filled 2016-06-17 (×4): qty 1

## 2016-06-17 MED ORDER — APIXABAN 5 MG PO TABS
5.0000 mg | ORAL_TABLET | Freq: Two times a day (BID) | ORAL | Status: DC
  Administered 2016-06-17 – 2016-06-19 (×4): 5 mg via ORAL
  Filled 2016-06-17 (×5): qty 1

## 2016-06-17 MED ORDER — HYDROCORTISONE NA SUCCINATE PF 250 MG IJ SOLR
200.0000 mg | Freq: Once | INTRAMUSCULAR | Status: AC
  Administered 2016-06-17: 200 mg via INTRAVENOUS
  Filled 2016-06-17: qty 200

## 2016-06-17 MED ORDER — ALTEPLASE 2 MG IJ SOLR: 2.0000 mg | Freq: Once | INTRAMUSCULAR | Status: DC | PRN

## 2016-06-17 MED ORDER — LIDOCAINE HCL (PF) 1 % IJ SOLN: 5.0000 mL | INTRAMUSCULAR | Status: DC | PRN

## 2016-06-17 MED ORDER — LIDOCAINE-PRILOCAINE 2.5-2.5 % EX CREA
1.0000 "application " | TOPICAL_CREAM | CUTANEOUS | Status: DC | PRN
  Filled 2016-06-17: qty 5

## 2016-06-17 MED ORDER — LORAZEPAM 1 MG PO TABS
1.0000 mg | ORAL_TABLET | Freq: Two times a day (BID) | ORAL | Status: DC
  Administered 2016-06-17 – 2016-06-19 (×6): 1 mg via ORAL
  Filled 2016-06-17 (×6): qty 1

## 2016-06-17 MED ORDER — METHYLPREDNISOLONE SODIUM SUCC 125 MG IJ SOLR
60.0000 mg | Freq: Once | INTRAMUSCULAR | Status: AC
  Administered 2016-06-17: 60 mg via INTRAVENOUS
  Filled 2016-06-17: qty 2

## 2016-06-17 MED ORDER — SERTRALINE HCL 100 MG PO TABS
100.0000 mg | ORAL_TABLET | Freq: Every day | ORAL | Status: DC
  Administered 2016-06-17 – 2016-06-19 (×3): 100 mg via ORAL
  Filled 2016-06-17 (×3): qty 1

## 2016-06-17 MED ORDER — NITROGLYCERIN 0.4 MG SL SUBL: 0.4000 mg | SUBLINGUAL_TABLET | SUBLINGUAL | Status: DC | PRN

## 2016-06-17 MED ORDER — PENTAFLUOROPROP-TETRAFLUOROETH EX AERO: 1.0000 "application " | INHALATION_SPRAY | CUTANEOUS | Status: DC | PRN

## 2016-06-17 MED ORDER — HYDROMORPHONE HCL 1 MG/ML IJ SOLN
0.2500 mg | Freq: Once | INTRAMUSCULAR | Status: AC
Start: 2016-06-17 — End: 2016-06-17
  Administered 2016-06-17: 0.25 mg via INTRAVENOUS
  Filled 2016-06-17: qty 1

## 2016-06-17 MED ORDER — HYDROMORPHONE HCL 1 MG/ML IJ SOLN
INTRAMUSCULAR | Status: AC
  Filled 2016-06-17: qty 1

## 2016-06-17 MED ORDER — ISOSORBIDE DINITRATE 10 MG PO TABS
10.0000 mg | ORAL_TABLET | Freq: Three times a day (TID) | ORAL | Status: DC
  Administered 2016-06-17 – 2016-06-19 (×6): 10 mg via ORAL
  Filled 2016-06-17 (×9): qty 1

## 2016-06-17 MED ORDER — HEPARIN BOLUS VIA INFUSION
4000.0000 [IU] | Freq: Once | INTRAVENOUS | Status: AC
  Administered 2016-06-17: 4000 [IU] via INTRAVENOUS
  Filled 2016-06-17: qty 4000

## 2016-06-17 MED ORDER — SODIUM CHLORIDE 0.9 % IV SOLN
50.0000 mg | Freq: Once | INTRAVENOUS | Status: AC
  Administered 2016-06-17: 50 mg via INTRAVENOUS
  Filled 2016-06-17: qty 1

## 2016-06-17 MED ORDER — IOPAMIDOL (ISOVUE-370) INJECTION 76%
INTRAVENOUS | Status: AC
  Administered 2016-06-17: 100 mL
  Filled 2016-06-17: qty 100

## 2016-06-17 MED ORDER — SODIUM CHLORIDE 0.9 % IV SOLN: 100.0000 mL | INTRAVENOUS | Status: DC | PRN

## 2016-06-17 MED ORDER — HEPARIN (PORCINE) IN NACL 100-0.45 UNIT/ML-% IJ SOLN
900.0000 [IU]/h | INTRAMUSCULAR | Status: DC
  Administered 2016-06-17: 900 [IU]/h via INTRAVENOUS
  Filled 2016-06-17: qty 250

## 2016-06-17 MED ORDER — AMLODIPINE BESYLATE 5 MG PO TABS
5.0000 mg | ORAL_TABLET | Freq: Every day | ORAL | Status: DC
  Administered 2016-06-17 – 2016-06-19 (×3): 5 mg via ORAL
  Filled 2016-06-17 (×3): qty 1

## 2016-06-17 MED ORDER — SEVELAMER CARBONATE 800 MG PO TABS
2400.0000 mg | ORAL_TABLET | Freq: Three times a day (TID) | ORAL | Status: DC
  Administered 2016-06-17 – 2016-06-19 (×5): 2400 mg via ORAL
  Filled 2016-06-17 (×6): qty 3

## 2016-06-17 MED ORDER — DIPHENHYDRAMINE HCL 25 MG PO CAPS
50.0000 mg | ORAL_CAPSULE | Freq: Once | ORAL | Status: AC
  Administered 2016-06-17: 50 mg via ORAL
  Filled 2016-06-17: qty 2

## 2016-06-17 MED ORDER — HYDRALAZINE HCL 20 MG/ML IJ SOLN
5.0000 mg | INTRAMUSCULAR | Status: DC | PRN
  Filled 2016-06-17: qty 1

## 2016-06-17 MED ORDER — HEPARIN SODIUM (PORCINE) 1000 UNIT/ML DIALYSIS: 1000.0000 [IU] | INTRAMUSCULAR | Status: DC | PRN

## 2016-06-17 MED ORDER — HYDRALAZINE HCL 25 MG PO TABS
25.0000 mg | ORAL_TABLET | Freq: Three times a day (TID) | ORAL | Status: DC
  Administered 2016-06-17 – 2016-06-18 (×5): 25 mg via ORAL
  Filled 2016-06-17 (×5): qty 1

## 2016-06-17 MED ORDER — LIDOCAINE 5 % EX PTCH
1.0000 | MEDICATED_PATCH | CUTANEOUS | Status: DC
  Administered 2016-06-17 – 2016-06-19 (×3): 1 via TRANSDERMAL
  Filled 2016-06-17 (×3): qty 1

## 2016-06-17 MED ORDER — RENA-VITE PO TABS
1.0000 | ORAL_TABLET | Freq: Every day | ORAL | Status: DC
  Administered 2016-06-17 – 2016-06-18 (×2): 1 via ORAL
  Filled 2016-06-17 (×2): qty 1

## 2016-06-17 MED ORDER — SEVELAMER CARBONATE 800 MG PO TABS
1600.0000 mg | ORAL_TABLET | ORAL | Status: DC | PRN
  Filled 2016-06-17: qty 2

## 2016-06-17 MED ORDER — DIPHENHYDRAMINE HCL 25 MG PO CAPS: 25.0000 mg | ORAL_CAPSULE | Freq: Three times a day (TID) | ORAL | Status: DC | PRN

## 2016-06-17 MED ORDER — OXYCODONE HCL 5 MG PO TABS
5.0000 mg | ORAL_TABLET | Freq: Four times a day (QID) | ORAL | Status: DC | PRN
  Administered 2016-06-17 – 2016-06-18 (×2): 5 mg via ORAL
  Filled 2016-06-17 (×3): qty 1

## 2016-06-17 MED ORDER — HYDROMORPHONE HCL 1 MG/ML IJ SOLN
0.5000 mg | INTRAMUSCULAR | Status: DC | PRN
  Administered 2016-06-17 – 2016-06-19 (×14): 0.5 mg via INTRAVENOUS
  Filled 2016-06-17 (×12): qty 1

## 2016-06-17 NOTE — Progress Notes (Signed)
ANTICOAGULATION CONSULT NOTE - Initial Consult  Pharmacy Consult for heparin Indication: pulmonary embolus/ACS  Allergies  Allergen Reactions  . Aspirin Other (See Comments)    Interacts with Coreg  . Tramadol Anaphylaxis  . Vicodin [Hydrocodone-Acetaminophen] Hives  . Buprenorphine Hcl Itching and Other (See Comments)    Ok with oxycodone  . Iohexol Itching  . Morphine And Related Itching and Other (See Comments)    Ok with oxycodone    Patient Measurements: Height: 5\' 3"  (160 cm) Weight: 123 lb 7.3 oz (56 kg) IBW/kg (Calculated) : 52.4 Heparin Dosing Weight: 56  Vital Signs: Temp: 98 F (36.7 C) (08/26 0016) Temp Source: Oral (08/26 0016) BP: 162/145 (08/26 0100) Pulse Rate: 110 (08/26 0100)  Labs:  Recent Labs  06/17/16 0035 06/17/16 0121  HGB 11.7* 15.0  HCT 37.1 44.0  PLT 206  --   CREATININE 9.83* 9.10*    Estimated Creatinine Clearance: 7.8 mL/min (by C-G formula based on SCr of 9.1 mg/dL).   Medical History: Past Medical History:  Diagnosis Date  . 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 Anemia   . Anxiety   . Cardiomyopathy   . Cellulitis and abscess of face 03/22/2013  . CHF (congestive heart failure) (HCC)   . Chronic anticoagulation   . Chronic pain   . Depression   . Dysrhythmia    at times per pt.  . 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 ESRD (end stage renal disease) on dialysis [facility])    "MWF; [facility]" (04/11/2016)  . GERD (gastroesophageal reflux disease)   . H/O transfusion of packed red blood cells   . Hemodialysis patient (HCC)    Mon. Wed. Fri  . Hyperkalemia 09/2015  . Hypertension   . Narcotic abuse, continuous   . Osteoporosis   . Pelvic fracture (HCC) 06/2015   "on the right"  . Polycystic kidney disease   . Pulmonary emboli (HCC) 01/2012   Bilateral,  moderate clot burden, areas of pulmonary infarction and central necrosis  . Renal insufficiency   . Sickle cell anemia (HCC)    Assessment: 25 yo F with ESRD in ED with CP, missed 2 HD treatments. Pharmacy consulted to dose heparin for hx of PE, not taking Eliquis for 1 month, now has pleuritic CP, possible new PE or ACS/STEMI.  Wt 56 kg, ESRD, CBC WNL.   Goal of Therapy:  Heparin level 0.3-0.7 units/ml Monitor platelets by anticoagulation protocol: Yes   Plan:  Heparin 4000 unit bolus, drip at 900 units/hr HL in 8 hr for ESRD pt Daily HL/CBC  Herby Abraham, Pharm.D. [phone] 06/17/2016 2:33 AM

## 2016-06-17 NOTE — H&P (Signed)
History and Physical    [name] Small EAV:[id number] DOB: 07/30/1991 DOA: 06/17/2016  Referring MD/NP/PA:   PCP: Jeanann Lewandowsky, MD   Patient coming from:  The patient is coming from home.  At baseline, pt is independent for most of ADL.   Chief Complaint: Chest pain  HPI: [name] Small is a 25 y.o. female with medical history significant of ESRD-HD, noncompliance to dialysis, systolic congestive heart failure with EF 20%, s/p of AICD, DVT and PE, noncompliance to Eliquis, GERD, depression, anxiety, sickle cell anemia, who presents with chest pain.  Patient reports that she has been having chest pain for all day today. The chest pain is located left side of the chest, constant, 9 out of 10 in severity, sharp, nonradiating. It is pleuritic, aggravated by deep breath. Patient does not have fever or chills. She has mild SOB, but no cough. Of note, she states that she has not taken Eliquis for almost 1 month. She had mild diarrhea earlier, which has completely resolved. Currently no nausea, vomiting, abdominal pain, diarrhea. No symptoms of UTI or unilateral weakness. Patient states that she missed hemodialysis twice.   ED Course: pt was found to have anasarca, WBC 7.2, temperature normal, tachycardia, troponin 0.22, creatinine 9.83, BUN 72, bicarbonate 24, potassium 4.7. Chest x-ray is negative for infiltration, but showed cardiomegaly. Pt is admitted to telemetry bed as inpatient. Nephrology, Dr. Briant Cedar was consulted for hemodialysis.  Review of Systems:   General: no fevers, chills, no changes in body weight, has poor appetite, has fatigue. Has anasarca  HEENT: no blurry vision, hearing changes or sore throat Respiratory: has dyspnea, no coughing, wheezing CV: has chest pain, no palpitations GI: no nausea, vomiting, abdominal pain, had diarrhea, no constipation GU: no dysuria, burning on urination, increased urinary frequency, hematuria  Ext: has leg edema Neuro: no unilateral  weakness, numbness, or tingling, no vision change or hearing loss Skin: no rash MSK: No muscle spasm, no deformity, no limitation of range of movement in spin Heme: No easy bruising.  Travel history: No recent long distant travel.  Allergy:  Allergies  Allergen Reactions  . Aspirin Other (See Comments)    Interacts with Coreg  . Tramadol Anaphylaxis  . Vicodin [Hydrocodone-Acetaminophen] Hives  . Buprenorphine Hcl Itching and Other (See Comments)    Ok with oxycodone  . Iohexol Itching  . Morphine And Related Itching and Other (See Comments)    Ok with oxycodone    Past Medical History:  Diagnosis Date  . 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 Anemia   . Anxiety   . Cardiomyopathy   . Cellulitis and abscess of face 03/22/2013  . CHF (congestive heart failure) (HCC)   . Chronic anticoagulation   . Chronic pain   . Depression   . Dysrhythmia    at times per pt.  . 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 ESRD (end stage renal disease) on dialysis [facility])    "MWF; [facility]" (04/11/2016)  . GERD (gastroesophageal reflux disease)   . H/O transfusion of packed red blood cells   . Hemodialysis patient (HCC)    Mon. Wed. Fri  . Hyperkalemia 09/2015  . Hypertension   . Narcotic abuse, continuous   . Osteoporosis   . Pelvic fracture (HCC) 06/2015   "on the right"  . Polycystic kidney disease   . Pulmonary emboli (HCC) 01/2012  Bilateral, moderate clot burden, areas of pulmonary infarction and central necrosis  . Renal insufficiency   . Sickle cell anemia (HCC)     Past Surgical History:  Procedure Laterality Date  . AV FISTULA PLACEMENT Bilateral    "right side stopped working & never had it revised" (04/11/2016)  . CARDIAC CATHETERIZATION    . IMPLANTABLE CARDIOVERTER DEFIBRILLATOR IMPLANT Right 12/2014  .  INCISION AND DRAINAGE ABSCESS Right 03/21/2013   Procedure: INCISION AND DRAINAGE RIGHT CHEEK ABSCESS REMOVAL OF FOREIGN BODY;  Surgeon: Serena Colonel, MD;  [facility] OR;  Service: ENT;  Laterality: Right;  . INSERTION OF DIALYSIS CATHETER Right Mar 09, 2016   thigh, Dr. Wyn Quaker, [facility]  . 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 NEPHRECTOMY Right    third kidney placed in 2008, and body rejected in 2012   . PERIPHERAL VASCULAR CATHETERIZATION Left 03/09/2016   Procedure: A/V Shuntogram/Fistulagram;  Surgeon: Annice Needy, MD;  Location: [facility] INVASIVE CV LAB;  Service: Cardiovascular;  Laterality: Left;  . PERIPHERAL VASCULAR CATHETERIZATION N/A 05/09/2016   Procedure: Dialysis/Perma Catheter Removal;  Surgeon: Renford Dills, MD;  Location: [facility] INVASIVE CV LAB;  Service: Cardiovascular;  Laterality: N/A;  . REVISON OF ARTERIOVENOUS FISTULA Left 03/22/2016   Procedure: REVISON OF ARTERIOVENOUS FISTULA ( ARTEGRAFT );  Surgeon: Annice Needy, MD;  Location: [facility] ORS;  Service: Vascular;  Laterality: Left;  . TONSILLECTOMY AND ADENOIDECTOMY  ~ 2000    Social History:  reports that she quit smoking about 6 months ago. Her smoking use included Cigarettes. She smoked 0.00 packs per day for 1.00 year. She has never used smokeless tobacco. She reports that she does not drink alcohol or use drugs.  Family History:  Family History  Problem Relation Age of Onset  . Polycystic kidney disease Father   . Hypertension Father      Prior to Admission medications   Medication Sig Start Date End Date Taking? Authorizing Provider  amLODipine (NORVASC) 5 MG tablet Take 1 tablet (5 mg total) by mouth daily. 03/21/16  Yes Will Jorja Loa, MD  carvedilol (COREG) 6.25 MG tablet Take 6.25 mg by mouth 2 (two) times daily with a meal.    Yes Historical Provider, MD  hydrALAZINE (APRESOLINE) 25 MG tablet Take 1 tablet (25 mg total) by mouth 3 (three) times daily. 03/21/16  Yes Will Jorja Loa, MD  isosorbide dinitrate  (ISORDIL) 10 MG tablet Take 1 tablet (10 mg total) by mouth 3 (three) times daily. 02/01/16  Yes Olugbemiga E Hyman Hopes, MD  lidocaine (LIDODERM) 5 % Place 1 patch onto the skin daily. Remove & Discard patch within 12 hours or as directed by MD 06/03/16  Yes Barrett Henle, PA-C  LORazepam (ATIVAN) 1 MG tablet Take 1 tablet (1 mg total) by mouth daily as needed for anxiety. Patient taking differently: Take 1 mg by mouth 2 (two) times daily.  06/06/16  Yes Henrietta Hoover, NP  multivitamin (RENA-VIT) TABS tablet Take 1 tablet by mouth daily. 01/04/16  Yes Quentin Angst, MD  oxyCODONE (OXY IR/ROXICODONE) 5 MG immediate release tablet Take 1 tablet (5 mg total) by mouth every 6 (six) hours as needed for severe pain. 06/06/16  Yes Henrietta Hoover, NP  sertraline (ZOLOFT) 100 MG tablet Take 1 tablet (100 mg total) by mouth daily. 03/13/16  Yes Quentin Angst, MD  sevelamer carbonate (RENVELA) 800 MG tablet Take 2-3 tablets (1,600-2,400 mg total) by mouth 3 (three) times daily with  meals. 3 tabs with meals, 2 tabs with snacks 01/04/16 01/03/17 Yes Olugbemiga Annitta NeedsE Jegede, MD  apixaban (ELIQUIS) 5 MG TABS tablet Take 1 tablet (5 mg total) by mouth 2 (two) times daily. From 04/09/16 Patient not taking: Reported on 06/17/2016 04/09/16   Enedina FinnerSona Patel, MD    Physical Exam: Vitals:   06/17/16 0038 06/17/16 0100 06/17/16 0244 06/17/16 0347  BP:  (!) 162/145 (!) 154/114 (!) 153/115  Pulse:  110  (!) 109  Resp:  13  16  Temp:    98.6 F (37 C)  TempSrc:    Oral  SpO2: 100% 100%  97%  Weight:    61.5 kg (135 lb 8 oz)  Height:    5\' 3"  (1.6 m)   General: In moderate acute distress. Has anasarca HEENT:       Eyes: PERRL, EOMI, no scleral icterus.       ENT: No discharge from the ears and nose, no pharynx injection, no tonsillar enlargement.        Neck: positive JVD, no bruit, no mass felt. Heme: No neck lymph node enlargement. Cardiac: S1/S2, RRR, No murmurs, No gallops or rubs. Respiratory:  No  rales, wheezing, rhonchi or rubs. GI: Soft, nondistended, nontender, no rebound pain, no organomegaly, BS present. GU: No hematuria Ext: 3+ pitting leg edema bilaterally. 2+DP/PT pulse bilaterally. Musculoskeletal: No joint deformities, No joint redness or warmth, no limitation of ROM in spin. Skin: No rashes.  Neuro: Alert, oriented X3, cranial nerves II-XII grossly intact, moves all extremities normally. Psych: Patient is not psychotic, no suicidal or hemocidal ideation.  Labs on Admission: I have personally reviewed following labs and imaging studies  CBC:  Recent Labs Lab 06/17/16 0035 06/17/16 0121  WBC 7.2  --   HGB 11.7* 15.0  HCT 37.1 44.0  MCV 92.1  --   PLT 206  --    Basic Metabolic Panel:  Recent Labs Lab 06/17/16 0035 06/17/16 0121  NA 138 137  K 4.7 4.7  CL 97* 99*  CO2 24  --   GLUCOSE 93 87  BUN 72* 72*  CREATININE 9.83* 9.10*  CALCIUM 10.6*  --    GFR: Estimated Creatinine Clearance: 7.8 mL/min (by C-G formula based on SCr of 9.1 mg/dL). Liver Function Tests: No results for input(s): AST, ALT, ALKPHOS, BILITOT, PROT, ALBUMIN in the last 168 hours. No results for input(s): LIPASE, AMYLASE in the last 168 hours. No results for input(s): AMMONIA in the last 168 hours. Coagulation Profile:  Recent Labs Lab 06/17/16 0229  INR 1.35   Cardiac Enzymes:  Recent Labs Lab 06/17/16 0229  TROPONINI 0.25*   BNP (last 3 results) No results for input(s): PROBNP in the last 8760 hours. HbA1C: No results for input(s): HGBA1C in the last 72 hours. CBG: No results for input(s): GLUCAP in the last 168 hours. Lipid Profile:  Recent Labs  06/17/16 0413  CHOL 137  HDL 58  LDLCALC 69  TRIG 51  CHOLHDL 2.4   Thyroid Function Tests: No results for input(s): TSH, T4TOTAL, FREET4, T3FREE, THYROIDAB in the last 72 hours. Anemia Panel: No results for input(s): VITAMINB12, FOLATE, FERRITIN, TIBC, IRON, RETICCTPCT in the last 72 hours. Urine analysis:     Component Value Date/Time   COLORURINE YELLOW 01/14/2013 1639   APPEARANCEUR CLOUDY (A) 01/14/2013 1639   LABSPEC 1.008 01/14/2013 1639   PHURINE 8.0 01/14/2013 1639   GLUCOSEU NEGATIVE 01/14/2013 1639   HGBUR SMALL (A) 01/14/2013 1639   BILIRUBINUR NEGATIVE  01/14/2013 1639   KETONESUR NEGATIVE 01/14/2013 1639   PROTEINUR 30 (A) 01/14/2013 1639   UROBILINOGEN 0.2 01/14/2013 1639   NITRITE NEGATIVE 01/14/2013 1639   LEUKOCYTESUR MODERATE (A) 01/14/2013 1639   Sepsis Labs: @LABRCNTIP (procalcitonin:4,lacticidven:4) )No results found for this or any previous visit (from the past 240 hour(s)).   Radiological Exams on Admission: Dg Chest 2 View  Result Date: 06/17/2016 CLINICAL DATA:  Lambert Mody non radiating chest pain starting today. Shortness of breath. History of hypertension and atrial fibrillation. EXAM: CHEST  2 VIEW COMPARISON:  06/03/2016 FINDINGS: Cardiac pacemaker. Diffuse cardiac enlargement with normal pulmonary vascularity. No focal airspace disease or consolidation in the lungs. Blunting of the right costophrenic angle suggests small pleural effusion. No pneumothorax. IMPRESSION: Cardiac enlargement similar previous study. No evidence of active pulmonary disease. Small right pleural effusion. Electronically Signed   By: Burman Nieves M.D.   On: 06/17/2016 01:59     EKG: Independently reviewed.  Sinus rhythm, QTC 541, LAE, T-wave inversion in V5-V6, ST depression in III-aVF. These change are similar to previous EKG on 06/03/16  Assessment/Plan Principal Problem:   Chest pain Active Problems:   ESRD (end stage renal disease) on dialysis (HCC)   PE (pulmonary embolism)   Acute on chronic systolic heart failure (HCC)   Anemia of chronic renal failure, stage 5 (HCC)   AICD (automatic cardioverter/defibrillator) present   DVT (deep venous thrombosis), left   Chronic anticoagulation   Volume overload   Internal jugular (IJ) vein thromboembolism, acute (HCC)   Essential  hypertension   Depression with anxiety   Chest pain: Patient has pleuritic chest pain, potential differential diagnosis include new PE given noncompliance to Eliquis and hx of DVT/PE, pericarditis given noncompliance to dialysis. ACS cannot be completely ruled out given elevated troponin. No infiltration on chest x-ray.  - will admit to telemetry bed as inpatient  - Start IV heparin - cycle CE q6 x3 and repeat her EKG in the am  - Nitroglycerin, dilaudid, and lipitor  - Risk factor stratification: will check FLP, UDS and A1C  - 2d echo - will get CTA of chest. Pt is allergic to Iohexlol. Will pretreat the patient with 50 mg of Benadryl and 200 mg of hydrocortisone before getting CTA.  ESRD-HD (MWF): not compliant to dialysis, missed 2 times. Creatinine 9.83, BUN 72, bicarbonate 24, potassium 4.7. Patient has anasarca. -renal, Dr. Briant Cedar was consulted for dialysis -continue Renvela  Hx of PE, left DVT and left internal jugular vein thrombus: -On IV heparin  Acute on chronic systolic heart failure (HCC): 2-D echo on 04/12/16 showed EF 20%. S/p of AICD. Pt has anasarca and severe fluid overload. -Volume management per renal -Continue Coreg  HTN: -Continue Coreg, amlodipine, oral hydralazine -IV hydralazine when necessary  Depression and anxiety: Stable, no suicidal or homicidal ideations. -Continue home medications: Zoloft, Ativan   DVT ppx: on IV Heparin   Code Status: Full code Family Communication: None at bed side.   Disposition Plan:  Anticipate discharge back to previous home environment Consults called:  Renal, Dr. Briant Cedar Admission status: Inpatient/tele   Date of Service 06/17/2016    Lorretta Harp [facility] Pager [phone]  If 7PM-7AM, please contact night-coverage [email or web address] Password [facility] 06/17/2016, 6:19 AM
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
## 2016-06-17 NOTE — Progress Notes (Signed)
ANTICOAGULATION CONSULT NOTE - Initial Consult  Pharmacy Consult for apixaban Indication: Hx of DVT and PE  Allergies  Allergen Reactions  . Aspirin Other (See Comments)    Interacts with Coreg  . Tramadol Anaphylaxis  . Vicodin [Hydrocodone-Acetaminophen] Hives  . Buprenorphine Hcl Itching and Other (See Comments)    Ok with oxycodone  . Iohexol Itching  . Morphine And Related Itching and Other (See Comments)    Ok with oxycodone    Patient Measurements: Height: 5\' 3"  (160 cm) Weight: 135 lb 5.8 oz (61.4 kg) (in bed ) IBW/kg (Calculated) : 52.4  Vital Signs: Temp: 98.6 F (37 C) (08/26 1655) Temp Source: Oral (08/26 1655) BP: 129/85 (08/26 1655) Pulse Rate: 99 (08/26 1655)  Labs:  Recent Labs  06/17/16 0035 06/17/16 0121 06/17/16 0229 06/17/16 0757  HGB 11.7* 15.0  --   --   HCT 37.1 44.0  --   --   PLT 206  --   --   --   APTT  --   --  40*  --   LABPROT  --   --  16.8*  --   INR  --   --  1.35  --   CREATININE 9.83* 9.10*  --   --   TROPONINI  --   --  0.25* 0.25*    Estimated Creatinine Clearance: 7.8 mL/min (by C-G formula based on SCr of 9.1 mg/dL).   Medical History: Past Medical History:  Diagnosis Date  . 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 Anemia   . Anxiety   . Cardiomyopathy   . Cellulitis and abscess of face 03/22/2013  . CHF (congestive heart failure) (HCC)   . Chronic anticoagulation   . Chronic pain   . Depression   . Dysrhythmia    at times per pt.  . 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 ESRD (end stage renal disease) on dialysis [facility])    "MWF; [facility]" (04/11/2016)  . GERD (gastroesophageal reflux disease)   . H/O transfusion of packed red blood cells   . Hemodialysis patient (HCC)    Mon. Wed. Fri  . Hyperkalemia 09/2015  . Hypertension   . Narcotic abuse,  continuous   . Osteoporosis   . Pelvic fracture (HCC) 06/2015   "on the right"  . Polycystic kidney disease   . Pulmonary emboli (HCC) 01/2012   Bilateral, moderate clot burden, areas of pulmonary infarction and central necrosis  . Renal insufficiency   . Sickle cell anemia [facility])    Assessment: 25 yo F with ESRD admitted 06/17/2016 with chest pain after missing 2 HD treatments. Pharmacy consulted to dose heparin initially for hx of PE and not taking Eliquis for 1 month. No new PE identified and pharmacy consulted to restart home apixaban.   Hgb 11.7, Plt wnl. No s/sx of bleeding noted.  Goal of Therapy:  Monitor platelets by anticoagulation protocol: Yes   Plan:  - Give first dose of apixaban at time of stopping heparin drip - Discontinue heparin drip - Apixaban 5 mg BID - Monitor CBC and s/sx of bleeding   Casilda Carls, PharmD. PGY-2 Pharmacy Resident Pager: [phone] 06/17/2016 5:32 PM

## 2016-06-17 NOTE — Progress Notes (Signed)
Patient seen and examined. Admitted after midnight secondary to pleuritic CP and associated SOB. Patient reports not being compliant with anticoagulant and also missing 2 HD treatment prior to admission. Patient is currently CP free and with elevated BP. CT angio neg for PE and demonstrating pleural effusion, vascular congestion and pericardial effusion.  Please refer to H&P written by Dr. Clyde Lundborg for further info/details on admission.  Plan: -HD by renal service today -continue supportive care and PRN analgesics -will resume anticoagulation  -follow clinical response   [name] Small [phone]

## 2016-06-17 NOTE — Consult Note (Signed)
[name] Small is an 25 y.o. female referred by Dr Gwenlyn Perking   Chief Complaint:  ESRD HPI: 25yo BF with ESRD and long hx of noncompliance and currently Dx in [place] [facility]) on MWF.  Last HD was Mon as she says her car does not work and unable to get to HD.  Says she is on HD for 3hr , DW 55kg.  Says she came to ER for CP.  Currently denies CP.  Past Medical History:  Diagnosis Date  . 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 Anemia   . Anxiety   . Cardiomyopathy   . Cellulitis and abscess of face 03/22/2013  . CHF (congestive heart failure) (HCC)   . Chronic anticoagulation   . Chronic pain   . Depression   . Dysrhythmia    at times per pt.  . 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 ESRD (end stage renal disease) on dialysis [facility])    "MWF; [facility]" (04/11/2016)  . GERD (gastroesophageal reflux disease)   . H/O transfusion of packed red blood cells   . Hemodialysis patient (HCC)    Mon. Wed. Fri  . Hyperkalemia 09/2015  . Hypertension   . Narcotic abuse, continuous   . Osteoporosis   . Pelvic fracture (HCC) 06/2015   "on the right"  . Polycystic kidney disease   . Pulmonary emboli (HCC) 01/2012   Bilateral, moderate clot burden, areas of pulmonary infarction and central necrosis  . Renal insufficiency   . Sickle cell anemia (HCC)     Past Surgical History:  Procedure Laterality Date  . AV FISTULA PLACEMENT Bilateral    "right side stopped working & never had it revised" (04/11/2016)  . CARDIAC CATHETERIZATION    . IMPLANTABLE CARDIOVERTER DEFIBRILLATOR IMPLANT Right 12/2014  . INCISION AND DRAINAGE ABSCESS Right 03/21/2013   Procedure: INCISION AND DRAINAGE RIGHT CHEEK ABSCESS REMOVAL OF FOREIGN BODY;  Surgeon: Serena Colonel, MD;  Location: [facility] OR;  Service: ENT;  Laterality: Right;  . INSERTION OF DIALYSIS  CATHETER Right Mar 09, 2016   thigh, Dr. Wyn Quaker, [facility]  . 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 NEPHRECTOMY Right    third kidney placed in 2008, and body rejected in 2012   . PERIPHERAL VASCULAR CATHETERIZATION Left 03/09/2016   Procedure: A/V Shuntogram/Fistulagram;  Surgeon: Annice Needy, MD;  Location: [facility] INVASIVE CV LAB;  Service: Cardiovascular;  Laterality: Left;  . PERIPHERAL VASCULAR CATHETERIZATION N/A 05/09/2016   Procedure: Dialysis/Perma Catheter Removal;  Surgeon: Renford Dills, MD;  Location: [facility] INVASIVE CV LAB;  Service: Cardiovascular;  Laterality: N/A;  . REVISON OF ARTERIOVENOUS FISTULA Left 03/22/2016   Procedure: REVISON OF ARTERIOVENOUS FISTULA ( ARTEGRAFT );  Surgeon: Annice Needy, MD;  Location: [facility] ORS;  Service: Vascular;  Laterality: Left;  . TONSILLECTOMY AND ADENOIDECTOMY  ~ 2000    Family History  Problem Relation Age of Onset  . Polycystic kidney disease Father   . Hypertension Father    Social History:  reports that she quit smoking about 6 months ago. Her smoking use included Cigarettes. She smoked 0.00 packs per day for 1.00 year. She has never used smokeless tobacco. She reports that she does not drink alcohol or use drugs.  Lives by herself in [place]  Allergies:  Allergies  Allergen Reactions  . Aspirin Other (See Comments)    Interacts with  Coreg  . Tramadol Anaphylaxis  . Vicodin [Hydrocodone-Acetaminophen] Hives  . Buprenorphine Hcl Itching and Other (See Comments)    Ok with oxycodone  . Iohexol Itching  . Morphine And Related Itching and Other (See Comments)    Ok with oxycodone    Medications Prior to Admission  Medication Sig Dispense Refill  . amLODipine (NORVASC) 5 MG tablet Take 1 tablet (5 mg total) by mouth daily. 30 tablet 3  . carvedilol (COREG) 6.25 MG tablet Take 6.25 mg by mouth 2 (two) times daily with a meal.   3  . hydrALAZINE (APRESOLINE) 25 MG tablet Take 1 tablet (25 mg total) by mouth 3 (three) times daily. 90 tablet 3  .  isosorbide dinitrate (ISORDIL) 10 MG tablet Take 1 tablet (10 mg total) by mouth 3 (three) times daily. 90 tablet 3  . lidocaine (LIDODERM) 5 % Place 1 patch onto the skin daily. Remove & Discard patch within 12 hours or as directed by MD 7 patch 0  . LORazepam (ATIVAN) 1 MG tablet Take 1 tablet (1 mg total) by mouth daily as needed for anxiety. (Patient taking differently: Take 1 mg by mouth 2 (two) times daily. ) 30 tablet 0  . multivitamin (RENA-VIT) TABS tablet Take 1 tablet by mouth daily. 90 tablet 3  . oxyCODONE (OXY IR/ROXICODONE) 5 MG immediate release tablet Take 1 tablet (5 mg total) by mouth every 6 (six) hours as needed for severe pain. 45 tablet 0  . sertraline (ZOLOFT) 100 MG tablet Take 1 tablet (100 mg total) by mouth daily. 30 tablet 3  . sevelamer carbonate (RENVELA) 800 MG tablet Take 2-3 tablets (1,600-2,400 mg total) by mouth 3 (three) times daily with meals. 3 tabs with meals, 2 tabs with snacks 120 tablet 3  . apixaban (ELIQUIS) 5 MG TABS tablet Take 1 tablet (5 mg total) by mouth 2 (two) times daily. From 04/09/16 (Patient not taking: Reported on 06/17/2016) 60 tablet 0     Lab Results: UA: ND  Recent Labs  06/17/16 0035 06/17/16 0121  WBC 7.2  --   HGB 11.7* 15.0  HCT 37.1 44.0  PLT 206  --    BMET  Recent Labs  06/17/16 0035 06/17/16 0121  NA 138 137  K 4.7 4.7  CL 97* 99*  CO2 24  --   GLUCOSE 93 87  BUN 72* 72*  CREATININE 9.83* 9.10*  CALCIUM 10.6*  --    LFT No results for input(s): PROT, ALBUMIN, AST, ALT, ALKPHOS, BILITOT, BILIDIR, IBILI in the last 72 hours. Dg Chest 2 View  Result Date: 06/17/2016 CLINICAL DATA:  Lambert ModySharp non radiating chest pain starting today. Shortness of breath. History of hypertension and atrial fibrillation. EXAM: CHEST  2 VIEW COMPARISON:  06/03/2016 FINDINGS: Cardiac pacemaker. Diffuse cardiac enlargement with normal pulmonary vascularity. No focal airspace disease or consolidation in the lungs. Blunting of the right  costophrenic angle suggests small pleural effusion. No pneumothorax. IMPRESSION: Cardiac enlargement similar previous study. No evidence of active pulmonary disease. Small right pleural effusion. Electronically Signed   By: Burman NievesWilliam  Stevens M.D.   On: 06/17/2016 01:59    ROS: No change in vision No SOB No abd pain CO pain in hands No dysuria No CP currently   PHYSICAL EXAM: Blood pressure (!) 153/115, pulse (!) 109, temperature 98.6 F (37 C), temperature source Oral, resp. rate 16, height 5\' 3"  (1.6 m), weight 61.5 kg (135 lb 8 oz), SpO2 97 %. HEENT: PERRLA EOMI  perioribital  puffiness NECK:+ JVD LUNGS:Clear CARDIAC:RRR ABD:+ BS NTND EXT:tr-1+ edema RLE.  LUA AVG + bruit NEURO:CNI, Ox3, no asterixis  Assessment: 1. ESRD 2. HTN 3. Severe cardiomyopathy PLAN: 1. HD today   Linna Thebeau T 06/17/2016, 7:47 AM
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
## 2016-06-17 NOTE — ED Notes (Signed)
Pt c/o itching from medication.

## 2016-06-17 NOTE — ED Notes (Signed)
Patient transported to CT 

## 2016-06-17 NOTE — ED Notes (Signed)
Patient transported to X-ray 

## 2016-06-18 DIAGNOSIS — I5023 Acute on chronic systolic (congestive) heart failure: Secondary | ICD-10-CM

## 2016-06-18 DIAGNOSIS — I10 Essential (primary) hypertension: Secondary | ICD-10-CM

## 2016-06-18 DIAGNOSIS — F418 Other specified anxiety disorders: Secondary | ICD-10-CM

## 2016-06-18 DIAGNOSIS — E8779 Other fluid overload: Secondary | ICD-10-CM

## 2016-06-18 LAB — HEMOGLOBIN A1C
Hgb A1c MFr Bld: 5.5 % (ref 4.8–5.6)
Mean Plasma Glucose: 111 mg/dL

## 2016-06-18 NOTE — Progress Notes (Signed)
S: Tolerated HD well.  No SOB.  She now says she is scheduled to have PTHectomy on tues at this hospital O:BP 124/84 (BP Location: Left Arm)   Pulse 98   Temp 98.6 F (37 C) (Oral)   Resp 20   Ht 5\' 3"  (1.6 m)   Wt 58.1 kg (128 lb 1.4 oz)   LMP  (LMP Unknown)   SpO2 97%   BMI 22.69 kg/m   Intake/Output Summary (Last 24 hours) at 06/18/16 0741 Last data filed at 06/18/16 0517  Gross per 24 hour  Intake              597 ml  Output             4000 ml  Net            -3403 ml   Weight change: 5.4 kg (11 lb 14.5 oz) YNW:GNFAOGen:Awake and alert CVS:RRR Resp:clear Abd:+ BS NTND Ext: Tr edema RLE   LUA AVG + bruit NEURO:CNI Ox3 no asterixis   . amLODipine  5 mg Oral Daily  . apixaban  5 mg Oral BID  . carvedilol  6.25 mg Oral BID WC  . hydrALAZINE  25 mg Oral TID  . isosorbide dinitrate  10 mg Oral TID  . lidocaine  1 patch Transdermal Q24H  . LORazepam  1 mg Oral BID  . multivitamin  1 tablet Oral QHS  . sertraline  100 mg Oral Daily  . sevelamer carbonate  2,400 mg Oral TID WC   Dg Chest 2 View  Result Date: 06/17/2016 CLINICAL DATA:  Lambert ModySharp non radiating chest pain starting today. Shortness of breath. History of hypertension and atrial fibrillation. EXAM: CHEST  2 VIEW COMPARISON:  06/03/2016 FINDINGS: Cardiac pacemaker. Diffuse cardiac enlargement with normal pulmonary vascularity. No focal airspace disease or consolidation in the lungs. Blunting of the right costophrenic angle suggests small pleural effusion. No pneumothorax. IMPRESSION: Cardiac enlargement similar previous study. No evidence of active pulmonary disease. Small right pleural effusion. Electronically Signed   By: Burman NievesWilliam  Stevens M.D.   On: 06/17/2016 01:59   Ct Angio Chest Pe W Or Wo Contrast  Result Date: 06/17/2016 CLINICAL DATA:  Chest pain. EXAM: CT ANGIOGRAPHY CHEST WITH CONTRAST TECHNIQUE: Multidetector CT imaging of the chest was performed using the standard protocol during bolus administration of intravenous  contrast. Multiplanar CT image reconstructions and MIPs were obtained to evaluate the vascular anatomy. CONTRAST:  80 cc Isovue 370 IV. Patient was pre-medicated due to history of itching. No difficulties. COMPARISON:  04/12/2016 FINDINGS: Cardiovascular: There is cardiomegaly. Moderate pericardial effusion. No evidence of aortic aneurysm. No filling defects in the pulmonary arteries to suggest pulmonary emboli. Mediastinum/Nodes: No mediastinal, hilar, or axillary adenopathy. Lungs/Pleura: Mild vascular congestion. Small bilateral pleural effusions. Linear subsegmental atelectasis at the left base. No confluent opacities. Upper Abdomen: Imaging into the upper abdomen shows no acute findings. Musculoskeletal: No acute bony abnormality or focal bone lesion. Chest wall soft tissues are unremarkable. Right chest wall pacer noted with single lead tip in the right ventricle. Review of the MIP images confirms the above findings. IMPRESSION: No evidence of pulmonary embolus. Marked cardiomegaly.  Moderate pericardial effusion. Small pleural effusions. Electronically Signed   By: Charlett NoseKevin  Dover M.D.   On: 06/17/2016 10:13   BMET    Component Value Date/Time   NA 137 06/17/2016 0121   K 4.7 06/17/2016 0121   CL 99 (L) 06/17/2016 0121   CO2 24 06/17/2016 0035   GLUCOSE  87 06/17/2016 0121   BUN 72 (H) 06/17/2016 0121   CREATININE 9.10 (H) 06/17/2016 0121   CALCIUM 10.6 (H) 06/17/2016 0035   GFRNONAA 5 (L) 06/17/2016 0035   GFRAA 6 (L) 06/17/2016 0035   CBC    Component Value Date/Time   WBC 7.2 06/17/2016 0035   RBC 4.03 06/17/2016 0035   HGB 15.0 06/17/2016 0121   HCT 44.0 06/17/2016 0121   PLT 206 06/17/2016 0035   MCV 92.1 06/17/2016 0035   MCH 29.0 06/17/2016 0035   MCHC 31.5 06/17/2016 0035   RDW 18.4 (H) 06/17/2016 0035   LYMPHSABS 1.2 03/31/2016 1620   MONOABS 0.5 03/31/2016 1620   EOSABS 0.0 03/31/2016 1620   BASOSABS 0.1 03/31/2016 1620     Assessment: 1. ESRD 2. HTN 3. Severe  cardiomyopathy 4. Sec HPTH  Says she is schedule for PTHectomy on tues  Plan: 1.  HD in AM 2. May need help from SW to arrange transportation to outpt HD 3. If plan is for surgery on tues then eliquis should be stopped??  Discussed with Dr Germain Osgood T

## 2016-06-18 NOTE — Progress Notes (Signed)
CM met with pt in room and gave pt Medicaid Transportation Resource.  Pt states she is already set up with [facility] (thru [employer]). Pt verbalized understanding she needs to call every time she needs transportation to Dialysis 48 hours before she needs the ride and bc her dialysis is MWF at 12:[address] in [place], she needs to call and request for pickup for 11:00am.  Pt states she knows this and will make these calls to ensure she makes it to Dialysis; pt is scheduled to be dialyzed her tomorrow then go home.  Pt states she will call tomorrow morning for pickup on Wedmesday 06/20/16.  No other CM needs were communicated.

## 2016-06-18 NOTE — Progress Notes (Signed)
[facility] PROGRESS NOTE  [name] Small ZOX:[id number] DOB: Jun 19, 1991 DOA: 06/17/2016 PCP: Jeanann Lewandowsky, MD  Interim summary and HPI 25 y.o. female with medical history significant of ESRD-HD, noncompliance to dialysis, systolic congestive heart failure with EF 20%, s/p of AICD, DVT and PE, noncompliance to Eliquis, GERD, depression, anxiety, sickle cell anemia, who presents with chest pain. Patient reports that she has been having chest pain for all day today. The chest pain is located left side of the chest, constant, 9 out of 10 in severity, sharp, nonradiating. It is pleuritic, aggravated by deep breath. Patient does not have fever or chills. She has mild SOB, but no cough. Of note, she states that she has not taken Eliquis for almost 1 month. She had mild diarrhea earlier, which has completely resolved. Currently no nausea, vomiting, abdominal pain, diarrhea. No symptoms of UTI or unilateral weakness. Patient states that she missed hemodialysis twice.   Assessment/Plan: Chest pain: Patient has pleuritic chest pain, potential differential diagnosis include new PE given noncompliance to Eliquis and hx of DVT/PE, pericarditis given noncompliance to dialysis. ACS cannot be completely ruled out given elevated troponin. No infiltration on chest x-ray. -neg CTA for PE or PNA -no signs of acute ischemic changes on EKG or telemetry -pleuritic pain secondary to pleural effusion, vascular congestion and pericardial effusion (all due to lack of HD and fluid overload) -improving/resolving after resumption of HD treatment -will continue supportive care  ESRD-HD (MWF)/hyperparathyroidism: not compliant to dialysis, missed 2 times.  -renal service on Board -Dr. Briant Cedar assistance appreciated; patient to resume HD at [facility] at discharge  -continue Renvela  -patient needs follow up with surgery for parathyroidectomy; but needs preoperative visit and cardiology clearance before  surgery.  Hx of PE, left DVT and left internal jugular vein thrombus: -eliquis resume   Acute on chronic systolic heart failure (HCC): 2-D echo on 04/12/16 showed EF 20%. S/p of AICD. Pt has anasarca and severe fluid overload on admisison. -Volume management with HD -Continue Coreg -advise to follow low sodium diet and to be compliant with dialysis treatments   HTN: -Continue Coreg, amlodipine, oral hydralazine -HD will also help controlling BP  Depression and anxiety: Stable, no suicidal or homicidal ideations. -Continue home medications: Zoloft, Ativan -flat affect on exam appreciated    Code Status: Full Family Communication: no family at bedside  Disposition Plan: home in am after HD. Need to discuss and arrange with surgery for parathyroidectomy    Consultants:  Nephrology    Procedures:  See below for x-ray reports   HD  Antibiotics:  None   HPI/Subjective: Afebrile, no nausea, no vomiting; complaining of intermittent CP. No frank SOB.  Objective: Vitals:   06/18/16 1100 06/18/16 1351  BP: 127/86 118/86  Pulse: 92 (!) 103  Resp:  18  Temp:  98.2 F (36.8 C)    Intake/Output Summary (Last 24 hours) at 06/18/16 1644 Last data filed at 06/18/16 1500  Gross per 24 hour  Intake              717 ml  Output                1 ml  Net              716 ml   Filed Weights   06/17/16 0347 06/17/16 1154 06/17/16 1556  Weight: 61.5 kg (135 lb 8 oz) 61.4 kg (135 lb 5.8 oz) 58.1 kg (128 lb 1.4 oz)    Exam:   General:  Feeling better, still with some intermittent pleuritic discomfort, denies nausea, vomiting and abd pain. Patient with good O2 sat on RA.  Cardiovascular: S1 and S2, no rubs or gallops  Respiratory: decrease air at basis; no wheezing  Abdomen: soft, NT, ND, positive BS  Musculoskeletal: trace edema bilaterally; LUA with AVG good bruit.  Data Reviewed: Basic Metabolic Panel:  Recent Labs Lab 06/17/16 0035 06/17/16 0121  NA 138  137  K 4.7 4.7  CL 97* 99*  CO2 24  --   GLUCOSE 93 87  BUN 72* 72*  CREATININE 9.83* 9.10*  CALCIUM 10.6*  --    CBC:  Recent Labs Lab 06/17/16 0035 06/17/16 0121  WBC 7.2  --   HGB 11.7* 15.0  HCT 37.1 44.0  MCV 92.1  --   PLT 206  --    Cardiac Enzymes:  Recent Labs Lab 06/17/16 0229 06/17/16 0757 06/17/16 1702  TROPONINI 0.25* 0.25* 0.23*   BNP (last 3 results)  Recent Labs  06/29/15 1615 09/29/15 1242 04/11/16 1130  BNP >4,500.0* 2,492.9* >4,500.0*   Studies: Dg Chest 2 View  Result Date: 06/17/2016 CLINICAL DATA:  Lambert ModySharp non radiating chest pain starting today. Shortness of breath. History of hypertension and atrial fibrillation. EXAM: CHEST  2 VIEW COMPARISON:  06/03/2016 FINDINGS: Cardiac pacemaker. Diffuse cardiac enlargement with normal pulmonary vascularity. No focal airspace disease or consolidation in the lungs. Blunting of the right costophrenic angle suggests small pleural effusion. No pneumothorax. IMPRESSION: Cardiac enlargement similar previous study. No evidence of active pulmonary disease. Small right pleural effusion. Electronically Signed   By: Burman NievesWilliam  Stevens M.D.   On: 06/17/2016 01:59   Ct Angio Chest Pe W Or Wo Contrast  Result Date: 06/17/2016 CLINICAL DATA:  Chest pain. EXAM: CT ANGIOGRAPHY CHEST WITH CONTRAST TECHNIQUE: Multidetector CT imaging of the chest was performed using the standard protocol during bolus administration of intravenous contrast. Multiplanar CT image reconstructions and MIPs were obtained to evaluate the vascular anatomy. CONTRAST:  80 cc Isovue 370 IV. Patient was pre-medicated due to history of itching. No difficulties. COMPARISON:  04/12/2016 FINDINGS: Cardiovascular: There is cardiomegaly. Moderate pericardial effusion. No evidence of aortic aneurysm. No filling defects in the pulmonary arteries to suggest pulmonary emboli. Mediastinum/Nodes: No mediastinal, hilar, or axillary adenopathy. Lungs/Pleura: Mild vascular  congestion. Small bilateral pleural effusions. Linear subsegmental atelectasis at the left base. No confluent opacities. Upper Abdomen: Imaging into the upper abdomen shows no acute findings. Musculoskeletal: No acute bony abnormality or focal bone lesion. Chest wall soft tissues are unremarkable. Right chest wall pacer noted with single lead tip in the right ventricle. Review of the MIP images confirms the above findings. IMPRESSION: No evidence of pulmonary embolus. Marked cardiomegaly.  Moderate pericardial effusion. Small pleural effusions. Electronically Signed   By: Charlett NoseKevin  Dover M.D.   On: 06/17/2016 10:13    Scheduled Meds: . amLODipine  5 mg Oral Daily  . apixaban  5 mg Oral BID  . carvedilol  6.25 mg Oral BID WC  . hydrALAZINE  25 mg Oral TID  . isosorbide dinitrate  10 mg Oral TID  . lidocaine  1 patch Transdermal Q24H  . LORazepam  1 mg Oral BID  . multivitamin  1 tablet Oral QHS  . sertraline  100 mg Oral Daily  . sevelamer carbonate  2,400 mg Oral TID WC   Continuous Infusions:   Principal Problem:   Chest pain Active Problems:   ESRD (end stage renal disease) on dialysis (HCC)  PE (pulmonary embolism)   Acute on chronic systolic heart failure (HCC)   Anemia of chronic renal failure, stage 5 (HCC)   AICD (automatic cardioverter/defibrillator) present   DVT (deep venous thrombosis), left   Chronic anticoagulation   Volume overload   Internal jugular (IJ) vein thromboembolism, acute (HCC)   Essential hypertension   Depression with anxiety    Time spent: 30 minutes    Vassie Loll  [facility] Pager [phone]. If 7PM-7AM, please contact night-coverage at [email or web address], password [facility] 06/18/2016, 4:44 PM  LOS: 1 day

## 2016-06-19 ENCOUNTER — Other Ambulatory Visit (HOSPITAL_COMMUNITY): Payer: Medicare Other

## 2016-06-19 ENCOUNTER — Inpatient Hospital Stay (HOSPITAL_COMMUNITY): Payer: Medicare Other

## 2016-06-19 ENCOUNTER — Inpatient Hospital Stay (HOSPITAL_COMMUNITY): Admission: RE | Admit: 2016-06-19 | Payer: Medicare Other | Source: Ambulatory Visit

## 2016-06-19 DIAGNOSIS — R079 Chest pain, unspecified: Secondary | ICD-10-CM

## 2016-06-19 LAB — CBC
HCT: 34.8 % — ABNORMAL LOW (ref 36.0–46.0)
Hemoglobin: 10.8 g/dL — ABNORMAL LOW (ref 12.0–15.0)
MCH: 28.6 pg (ref 26.0–34.0)
MCHC: 31 g/dL (ref 30.0–36.0)
MCV: 92.3 fL (ref 78.0–100.0)
PLATELETS: 186 10*3/uL (ref 150–400)
RBC: 3.77 MIL/uL — ABNORMAL LOW (ref 3.87–5.11)
RDW: 18.5 % — AB (ref 11.5–15.5)
WBC: 6.1 10*3/uL (ref 4.0–10.5)

## 2016-06-19 LAB — ECHOCARDIOGRAM COMPLETE
HEIGHTINCHES: 63 in
Weight: 1982.38 oz

## 2016-06-19 LAB — RENAL FUNCTION PANEL
Albumin: 3.2 g/dL — ABNORMAL LOW (ref 3.5–5.0)
Anion gap: 17 — ABNORMAL HIGH (ref 5–15)
BUN: 65 mg/dL — AB (ref 6–20)
CALCIUM: 10.6 mg/dL — AB (ref 8.9–10.3)
CHLORIDE: 94 mmol/L — AB (ref 101–111)
CO2: 26 mmol/L (ref 22–32)
CREATININE: 7.9 mg/dL — AB (ref 0.44–1.00)
GFR calc Af Amer: 7 mL/min — ABNORMAL LOW (ref >60)
GFR, EST NON AFRICAN AMERICAN: 6 mL/min — AB (ref >60)
Glucose, Bld: 80 mg/dL (ref 65–99)
Phosphorus: 7.6 mg/dL — ABNORMAL HIGH (ref 2.5–4.6)
Potassium: 4.6 mmol/L (ref 3.5–5.1)
SODIUM: 137 mmol/L (ref 135–145)

## 2016-06-19 MED ORDER — HYDROMORPHONE HCL 1 MG/ML IJ SOLN
INTRAMUSCULAR | Status: AC
  Administered 2016-06-19: 0.5 mg via INTRAVENOUS
  Filled 2016-06-19: qty 1

## 2016-06-19 NOTE — Progress Notes (Signed)
  Echocardiogram 2D Echocardiogram has been performed.  Marisue Humble 06/19/2016, 4:28 PM

## 2016-06-19 NOTE — Procedures (Signed)
  [facility] Procedure Note   Assessment/ Plan:   1.Vol overload: nonadherence to hemodialysis.  Improving with aggressive Uf.  4L goal today. 2.ESRD resuming regular MWF schedule. 3. Anemia: Hgb 10.8 4. 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 5. Nutrition: alb. 3.2 6. Hypertension: nonadherent, getting back on home meds.   7.  Transportation: per pt, she is setting up transport with [employer] transportation to [place] to [place] for hemodialysis.   Potential d/c today.  Subjective:    Seen and examined on hemodialysis.  Feeling better but thinks she still has some fluid on.   Objective:   BP (!) 148/89   Pulse (!) 104   Temp 97.9 F (36.6 C) (Oral)   Resp 17   Ht 5\' 3"  (1.6 m)   Wt 60.5 kg (133 lb 6.1 oz) Comment: stood to scale   LMP  (LMP Unknown)   SpO2 100%   BMI 23.63 kg/m   Physical Exam: Gen: NAD CVS:RRR, loud S2 Resp:CTAB Abd: abd fullness without fluid wave UJW:JXBJY LE edema  Labs: BMET  Recent Labs Lab 06/17/16 0035 06/17/16 0121 06/19/16 0700  NA 138 137 137  K 4.7 4.7 4.6  CL 97* 99* 94*  CO2 24  --  26  GLUCOSE 93 87 80  BUN 72* 72* 65*  CREATININE 9.83* 9.10* 7.90*  CALCIUM 10.6*  --  10.6*  PHOS  --   --  7.6*   CBC  Recent Labs Lab 06/17/16 0035 06/17/16 0121 06/19/16 0700  WBC 7.2  --  6.1  HGB 11.7* 15.0 10.8*  HCT 37.1 44.0 34.8*  MCV 92.1  --  92.3  PLT 206  --  186    @IMGRELPRIORS @ Medications:    . amLODipine  5 mg Oral Daily  . apixaban  5 mg Oral BID  . carvedilol  6.25 mg Oral BID WC  . hydrALAZINE  25 mg Oral TID  . isosorbide dinitrate  10 mg Oral TID  . lidocaine  1 patch Transdermal Q24H  . LORazepam  1 mg Oral BID  . multivitamin  1 tablet Oral QHS  . sertraline  100 mg Oral Daily  . sevelamer carbonate  2,400 mg Oral TID WC     Bufford Buttner, MD 06/19/2016, 11:27 AM

## 2016-06-19 NOTE — Discharge Summary (Signed)
Physician Discharge Summary  [name] SorrowVictoria Small AVW:[id number] DOB: 07/19/1991 DOA: 06/17/2016  PCP: Jeanann LewandowskyJEGEDE, OLUGBEMIGA, MD  Admit date: 06/17/2016 Discharge date: 06/19/2016  Time spent: 35 minutes  Recommendations for Outpatient Follow-up:  1. Repeat BMET to follow electrolytes 2. Reassess BP and adjust antihypertensive medication as eneded   Discharge Diagnoses:  Principal Problem:   Chest pain Active Problems:   ESRD (end stage renal disease) on dialysis (HCC)   PE (pulmonary embolism)   Acute on chronic systolic heart failure (HCC)   Anemia of chronic renal failure, stage 5 (HCC)   AICD (automatic cardioverter/defibrillator) present   DVT (deep venous thrombosis), left   Chronic anticoagulation   Volume overload   Internal jugular (IJ) vein thromboembolism, acute (HCC)   Essential hypertension   Depression with anxiety   Discharge Condition: stable and improved. Patient advise to be compliant with HD treatments and to follow up with PCP and renal service as an outpatient   Diet recommendation: heart healthy and low sodium diet   Filed Weights   06/17/16 1556 06/19/16 0904 06/19/16 1304  Weight: 58.1 kg (128 lb 1.4 oz) 60.5 kg (133 lb 6.1 oz) 56.2 kg (123 lb 14.4 oz)    History of present illness:  25 y.o.femalewith medical history significant of ESRD-HD,noncompliance to dialysis, systolic congestive heart failure with EF 20%, s/p of AICD,DVT and PE, noncompliance to Eliquis, GERD, depression, anxiety, sickle cell anemia, who presents with chest pain. Patient reports that she has been having chest pain for all day today. The chest pain islocated left side of the chest, constant, 9 out of 10 in severity, sharp, nonradiating. It is pleuritic, aggravated by deep breath. Patient does not have fever or chills. She has mild SOB, but no cough. Of note,she states that she has not taken Eliquis for almost 1 month. She had mild diarrhea earlier, which has completely resolved.  Currently no nausea, vomiting, abdominal pain, diarrhea. No symptoms of UTI or unilateral weakness. Patient states that she missed hemodialysis twice.   Hospital Course:  Chest pain:Patient has pleuritic chest pain, initial presumed diagnosis include new PE given noncompliance to Eliquis and prior hx of DVT/PE, pericarditis given noncompliance to dialysis. ACS cannot be completely ruled out given elevated troponin. No infiltration seen on chest x-ray. -neg CTA for PE and/or PNA -no signs of acute ischemic changes on EKG or telemetry -troponin elevation most likely from renal failure -pleuritic pain secondary to pleural effusion, vascular congestion and pericardial effusion (all due to lack of HD compliance and fluid overload) -improving/resolving after resumption of HD treatment -stable to being discharge -patient advise to be compliant with HD  ESRD-HD (MWF)/hyperparathyroidism: not compliant to dialysis, missed 2 times.  -renal service on Board -Dr. Briant CedarMattingly assistance appreciated; [facility]  -continue Renvela  -patient needs follow up with surgery for parathyroidectomy; but needs preoperative visit and cardiology clearance before surgery.  Hx of PE, left DVT and left internal jugular vein thrombus: -eliquis resume at discharge  Acute on chronic systolic heart failure (HCC): 2-D echo on 04/12/16 showed EF 20%. S/p of AICD. Pt has anasarca and severe fluid overload on admisison. -Volume management with HD -Continue Coreg -advise to follow low sodium diet and to be compliant with dialysis treatments   HTN: -Continue Coreg, amlodipine, oral hydralazine -HD will also help controlling BP  Depression and anxiety: -Stable, no suicidal or homicidal ideations. -Continue home medications: Zoloft, Ativan -flat affect on exam appreciated    Procedures:  See  below for x-ray reports   HD  Consultations:  Renal service   Discharge  Exam: Vitals:   06/19/16 1300 06/19/16 1304  BP: (!) 120/93 119/90  Pulse: 93 92  Resp:  15  Temp:  97.8 F (36.6 C)    General:  Feeling better, still with very mild intermittent pleuritic discomfort, denies nausea, vomiting and abd pain. Patient with good O2 sat on RA. Seen at HD.  Cardiovascular: S1 and S2, no rubs or gallops  Respiratory: decrease air at basis; no wheezing  Abdomen: soft, NT, ND, positive BS  Musculoskeletal: trace edema bilaterally; LUA with AVG good bruit.    Discharge Instructions   Discharge Instructions    Diet - low sodium heart healthy    Complete by:  As directed   Discharge instructions    Complete by:  As directed   Follow heart healthy diet and watch the amount of sodium intake Be compliant with HD treatments Arrange follow up with PCP in 2 weeks     Current Discharge Medication List    CONTINUE these medications which have NOT CHANGED   Details  amLODipine (NORVASC) 5 MG tablet Take 1 tablet (5 mg total) by mouth daily. Qty: 30 tablet, Refills: 3   Associated Diagnoses: Chronic combined systolic and diastolic congestive heart failure (HCC)    carvedilol (COREG) 6.25 MG tablet Take 6.25 mg by mouth 2 (two) times daily with a meal.  Refills: 3    hydrALAZINE (APRESOLINE) 25 MG tablet Take 1 tablet (25 mg total) by mouth 3 (three) times daily. Qty: 90 tablet, Refills: 3   Associated Diagnoses: Chronic combined systolic and diastolic congestive heart failure (HCC)    isosorbide dinitrate (ISORDIL) 10 MG tablet Take 1 tablet (10 mg total) by mouth 3 (three) times daily. Qty: 90 tablet, Refills: 3   Associated Diagnoses: Chronic combined systolic and diastolic congestive heart failure (HCC)    lidocaine (LIDODERM) 5 % Place 1 patch onto the skin daily. Remove & Discard patch within 12 hours or as directed by MD Qty: 7 patch, Refills: 0    LORazepam (ATIVAN) 1 MG tablet Take 1 tablet (1 mg total) by mouth daily as needed for  anxiety. Qty: 30 tablet, Refills: 0   Associated Diagnoses: ESRD on dialysis (HCC); Generalized anxiety disorder    multivitamin (RENA-VIT) TABS tablet Take 1 tablet by mouth daily. Qty: 90 tablet, Refills: 3   Associated Diagnoses: Sickle cell anemia with pain (HCC)    oxyCODONE (OXY IR/ROXICODONE) 5 MG immediate release tablet Take 1 tablet (5 mg total) by mouth every 6 (six) hours as needed for severe pain. Qty: 45 tablet, Refills: 0   Associated Diagnoses: Chronic pain syndrome    sertraline (ZOLOFT) 100 MG tablet Take 1 tablet (100 mg total) by mouth daily. Qty: 30 tablet, Refills: 3    sevelamer carbonate (RENVELA) 800 MG tablet Take 2-3 tablets (1,600-2,400 mg total) by mouth 3 (three) times daily with meals. 3 tabs with meals, 2 tabs with snacks Qty: 120 tablet, Refills: 3   Associated Diagnoses: ESRD on dialysis (HCC)    apixaban (ELIQUIS) 5 MG TABS tablet Take 1 tablet (5 mg total) by mouth 2 (two) times daily. From 04/09/16 Qty: 60 tablet, Refills: 0       Allergies  Allergen Reactions  . Aspirin Other (See Comments)    Interacts with Coreg  . Tramadol Anaphylaxis  . Vicodin [Hydrocodone-Acetaminophen] Hives  . Buprenorphine Hcl Itching and Other (See Comments)  Ok with oxycodone  . Iohexol Itching  . Morphine And Related Itching and Other (See Comments)    Ok with oxycodone   Follow-up Information    JEGEDE, OLUGBEMIGA, MD. Schedule an appointment as soon as possible for a visit in 2 week(s).   Specialty:  Internal Medicine Contact information: [address] [place] [address] [phone]           The results of significant diagnostics from this hospitalization (including imaging, microbiology, ancillary and laboratory) are listed below for reference.    Significant Diagnostic Studies: Dg Chest 2 View  Result Date: 06/17/2016 CLINICAL DATA:  Lambert Mody non radiating chest pain starting today. Shortness of breath. History of hypertension and  atrial fibrillation. EXAM: CHEST  2 VIEW COMPARISON:  06/03/2016 FINDINGS: Cardiac pacemaker. Diffuse cardiac enlargement with normal pulmonary vascularity. No focal airspace disease or consolidation in the lungs. Blunting of the right costophrenic angle suggests small pleural effusion. No pneumothorax. IMPRESSION: Cardiac enlargement similar previous study. No evidence of active pulmonary disease. Small right pleural effusion. Electronically Signed   By: Burman Nieves M.D.   On: 06/17/2016 01:59   Dg Ribs Unilateral W/chest Left  Result Date: 06/03/2016 CLINICAL DATA:  LEFT chest pain for 2 days, history end-stage renal disease on dialysis, CHF, sickle cell disease, CHF EXAM: LEFT RIBS AND CHEST - 3+ VIEW COMPARISON:  Chest radiograph 04/11/2016 FINDINGS: RIGHT subclavian AICD with lead projecting over RIGHT ventricle. Enlargement of cardiac silhouette, unable to exclude pericardial effusion with this configuration. Pulmonary vascular congestion. Subsegmental atelectasis lingula, chronic. No acute infiltrate, pleural effusion or pneumothorax. BB placed at site of symptoms lower lateral LEFT chest. Osseous demineralization. No definite acute rib fracture or bone destruction. Slight deformity and suspected callus seen at LEFT seventh rib question subacute versus old fracture ; this does not correspond to the site of symptoms. IMPRESSION: Enlargement of cardiac silhouette post AICD, unable to exclude pericardial fusion with disc cardiac configuration. Question subacute versus old fracture of LEFT seventh rib. Electronically Signed   By: Ulyses Southward M.D.   On: 06/03/2016 18:30   Ct Angio Chest Pe W Or Wo Contrast  Result Date: 06/17/2016 CLINICAL DATA:  Chest pain. EXAM: CT ANGIOGRAPHY CHEST WITH CONTRAST TECHNIQUE: Multidetector CT imaging of the chest was performed using the standard protocol during bolus administration of intravenous contrast. Multiplanar CT image reconstructions and MIPs were obtained to  evaluate the vascular anatomy. CONTRAST:  80 cc Isovue 370 IV. Patient was pre-medicated due to history of itching. No difficulties. COMPARISON:  04/12/2016 FINDINGS: Cardiovascular: There is cardiomegaly. Moderate pericardial effusion. No evidence of aortic aneurysm. No filling defects in the pulmonary arteries to suggest pulmonary emboli. Mediastinum/Nodes: No mediastinal, hilar, or axillary adenopathy. Lungs/Pleura: Mild vascular congestion. Small bilateral pleural effusions. Linear subsegmental atelectasis at the left base. No confluent opacities. Upper Abdomen: Imaging into the upper abdomen shows no acute findings. Musculoskeletal: No acute bony abnormality or focal bone lesion. Chest wall soft tissues are unremarkable. Right chest wall pacer noted with single lead tip in the right ventricle. Review of the MIP images confirms the above findings. IMPRESSION: No evidence of pulmonary embolus. Marked cardiomegaly.  Moderate pericardial effusion. Small pleural effusions. Electronically Signed   By: Charlett Nose M.D.   On: 06/17/2016 10:13   Nm Parathyroid W/spect  Result Date: 05/30/2016 CLINICAL DATA:  Evaluate for parathyroid adenoma. EXAM: NM PARATHYROID SCINTIGRAPHY AND SPECT IMAGING TECHNIQUE: Following intravenous administration of radiopharmaceutical, early and 2-hour delayed planar images were obtained  in the anterior projection. Delayed triplanar SPECT images were also obtained at 2 hours. RADIOPHARMACEUTICALS:  25.7 MCi Tc-53m Sestamibi IV COMPARISON:  None FINDINGS: On the early images there is mildly heterogeneous radiotracer activity within both lobes of the thyroid gland. On the 2 hour delayed images there is no significant change in the appearance of the thyroid gland compared with the 15 minutes delayed images. Persistent heterogeneous activity within both lobes identified. IMPRESSION: 1. Heterogeneous activity within both lobes of the thyroid gland on both the 15 minutes delayed and 2 hour  delayed images noted. Heterogeneity makes it difficult to identify a persistent focus of increased uptake specific for parathyroid adenoma. Electronically Signed   By: Signa Kell M.D.   On: 05/30/2016 08:58   Labs: Basic Metabolic Panel:  Recent Labs Lab 06/17/16 0035 06/17/16 0121 06/19/16 0700  NA 138 137 137  K 4.7 4.7 4.6  CL 97* 99* 94*  CO2 24  --  26  GLUCOSE 93 87 80  BUN 72* 72* 65*  CREATININE 9.83* 9.10* 7.90*  CALCIUM 10.6*  --  10.6*  PHOS  --   --  7.6*   Liver Function Tests:  Recent Labs Lab 06/19/16 0700  ALBUMIN 3.2*   CBC:  Recent Labs Lab 06/17/16 0035 06/17/16 0121 06/19/16 0700  WBC 7.2  --  6.1  HGB 11.7* 15.0 10.8*  HCT 37.1 44.0 34.8*  MCV 92.1  --  92.3  PLT 206  --  186   Cardiac Enzymes:  Recent Labs Lab 06/17/16 0229 06/17/16 0757 06/17/16 1702  TROPONINI 0.25* 0.25* 0.23*   BNP: BNP (last 3 results)  Recent Labs  06/29/15 1615 09/29/15 1242 04/11/16 1130  BNP >4,500.0* 2,492.9* >4,500.0*    Signed:  Vassie Loll MD.  [facility] 06/19/2016, 2:44 PM

## 2016-06-19 NOTE — Discharge Planning (Signed)
[facility] [phone]) called because the phone number they have for the pt is not in service and they are trying to contact her to arrange transportation.  EDCM researched chart to find that pt is admitted on 2W and we have the same number listed for pt.  Will request CM for 2W to update phone number in chart.

## 2016-06-20 ENCOUNTER — Ambulatory Visit (HOSPITAL_COMMUNITY): Admission: RE | Admit: 2016-06-20 | Payer: Medicare Other | Source: Ambulatory Visit | Admitting: General Surgery

## 2016-06-20 ENCOUNTER — Encounter (HOSPITAL_COMMUNITY): Admission: RE | Payer: Self-pay | Source: Ambulatory Visit

## 2016-06-20 SURGERY — PARATHYROIDECTOMY, WITH AUTOGRAFT TRANSPLANT
Anesthesia: General

## 2016-06-20 NOTE — Clinical Social Work Note (Signed)
Clinical Social Work Assessment  Patient Details  Name: [name] Small MRN: [id number] Date of Birth: [birth year]  Date of referral:  06/19/16               Reason for consult:  Transportation                Permission sought to share information with:    Permission granted to share information::  No   Housing/Transportation Living arrangements for the past 2 months:  Apartment Source of Information:  Patient Patient Interpreter Needed:  None Criminal Activity/Legal Involvement Pertinent to Current Situation/Hospitalization:  No - Comment as needed Significant Relationships:  Friend Lives with:  Self Do you feel safe going back to the place where you live?  Yes Need for family participation in patient care:  No (Coment)  Care giving concerns:  No care giving concerns identified.   Social Worker assessment / plan:  CSW met with pt on 06/19/2016 at 5 PM to address consult for transportation needs. Pt is ready for discharge. CSW introduced herself and explained role of social work. CSW also explained the process of obtaining transportation. Pt inquired about a cab voucher. CSW explored other options for transportation. Pt reported that her parents, whom she doesn't speak to, are out of town and she does not have any friends that are able to pick her up.   CSW obtained approval for cab voucher and gave it to RN to call cab when pt is ready for discharge. CSW is signing off as further needs identified.   Employment status:  Disabled (Comment on whether or not currently receiving Disability) Insurance information:  Medicare PT Recommendations:  Not assessed at this time Information / Referral to community resources:  Other (Comment Required)  Patient/Family's Response to care:  Pt was appreciative of CSW support.   Patient/Family's Understanding of and Emotional Response to Diagnosis, Current Treatment, and Prognosis:  Pt was grateful to obtain transportation home.   Emotional  Assessment Appearance:  Appears stated age Attitude/Demeanor/Rapport:  Other (Appropriate) Affect (typically observed):  Accepting, Pleasant Orientation:  Oriented to Self, Oriented to Place, Oriented to  Time, Oriented to Situation Alcohol / Substance use:  Never Used Psych involvement (Current and /or in the community):  No (Comment)  Discharge Needs  Concerns to be addressed:  Other (Comment Required (Transportation) Readmission within the last 30 days:  No Current discharge risk:  Chronically ill Barriers to Discharge:  Other, No Barriers Identified   Darden Dates, LCSW 06/20/2016, 12:12 PM

## 2016-06-21 ENCOUNTER — Encounter: Payer: Medicare Other | Admitting: Cardiology

## 2016-06-22 ENCOUNTER — Telehealth: Payer: Self-pay

## 2016-06-23 ENCOUNTER — Other Ambulatory Visit: Payer: Self-pay | Admitting: Internal Medicine

## 2016-06-23 DIAGNOSIS — G894 Chronic pain syndrome: Secondary | ICD-10-CM

## 2016-06-23 MED ORDER — OXYCODONE HCL 5 MG PO TABS: 5.0000 mg | ORAL_TABLET | Freq: Four times a day (QID) | ORAL | 0 refills | Status: DC | PRN

## 2016-06-23 MED FILL — oxyCODONE HCL 5 MG TABS: 5 | 22 days supply | Qty: 90 | Fill #0

## 2016-06-29 ENCOUNTER — Encounter: Payer: Self-pay | Admitting: Cardiology

## 2016-07-03 NOTE — Progress Notes (Signed)
Electrophysiology Office Note   Date:  07/04/2016   ID:  [name] Small, DOB [birth year], MRN [id number]  PCP:  Jeanann Lewandowsky, MD Primary Electrophysiologist:  Regan Lemming, MD    Chief Complaint  Patient presents with  . Pre-op Exam     History of Present Illness: [name] Small is a 25 y.o. female who presents today for electrophysiology evaluation.   She has a history of extensive medical history including end-stage renal disease on hemodialysis, pulmonary embolism, congestive heart failure status post AICD placement with low ejection fraction of 20-25% in January 2017, chronic pain syndrome and hypertension.   Today, she denies symptoms of palpitations, chest pain, shortness of breath, orthopnea, PND, lower extremity edema, claudication, dizziness, presyncope, syncope, bleeding, or neurologic sequela. The patient is tolerating medications without difficulties.  Admitted to the hospital with chest pain found to be pleuritic, thought due to a PE as she was noncompliant with Eliquis. She presents today as a preop visit for parathyroidectomy.  She says that she has chest pain, which is worse when she takes a deep breath, and when she lies flat. Her chest pain is improved when she sits up. It was thought in the hospital that her pain was due to a pericardial issue. She has a second type of chest pain that is worse when she is climbing stairs. This is a pressure type of chest pain. It gets better when she rests.   Past Medical History:  Diagnosis Date  . 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 Anemia   . Anxiety   . Cardiomyopathy   . Cellulitis and abscess of face 03/22/2013  . CHF (congestive heart failure) (HCC)   . Chronic anticoagulation   . Chronic pain   . Depression   . Dysrhythmia    at times per pt.  . 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 ESRD (end stage renal disease) on dialysis [facility])    "MWF; [facility]" (04/11/2016)  . GERD (gastroesophageal reflux disease)   . H/O transfusion of packed red blood cells   . Hemodialysis patient (HCC)    Mon. Wed. Fri  . Hyperkalemia 09/2015  . Hypertension   . Narcotic abuse, continuous   . Osteoporosis   . Pelvic fracture (HCC) 06/2015   "on the right"  . Polycystic kidney disease   . Pulmonary emboli (HCC) 01/2012   Bilateral, moderate clot burden, areas of pulmonary infarction and central necrosis  . Renal insufficiency   . Sickle cell anemia (HCC)    Past Surgical History:  Procedure Laterality Date  . AV FISTULA PLACEMENT Bilateral    "right side stopped working & never had it revised" (04/11/2016)  . CARDIAC CATHETERIZATION    . IMPLANTABLE CARDIOVERTER DEFIBRILLATOR IMPLANT Right 12/2014  . INCISION AND DRAINAGE ABSCESS Right 03/21/2013   Procedure: INCISION AND DRAINAGE RIGHT CHEEK ABSCESS REMOVAL OF FOREIGN BODY;  Surgeon: Serena Colonel, MD;  Location: [facility] OR;  Service: ENT;  Laterality: Right;  . INSERTION OF DIALYSIS CATHETER Right Mar 09, 2016   thigh, Dr. Wyn Quaker, [facility]  . 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 NEPHRECTOMY Right    third kidney placed in 2008, and body rejected in 2012   . PERIPHERAL VASCULAR CATHETERIZATION Left 03/09/2016   Procedure: A/V Shuntogram/Fistulagram;  Surgeon: Annice Needy, MD;  Location: [facility] INVASIVE CV LAB;  Service:  Cardiovascular;  Laterality: Left;  . PERIPHERAL VASCULAR CATHETERIZATION N/A 05/09/2016   Procedure: Dialysis/Perma Catheter Removal;  Surgeon: Renford Dills, MD;  Location: [facility] INVASIVE CV LAB;  Service: Cardiovascular;  Laterality: N/A;  . REVISON OF ARTERIOVENOUS FISTULA Left 03/22/2016   Procedure: REVISON OF ARTERIOVENOUS FISTULA ( ARTEGRAFT );  Surgeon: Annice Needy, MD;  Location: [facility] ORS;  Service: Vascular;  Laterality: Left;  . TONSILLECTOMY AND ADENOIDECTOMY  ~ 2000      Current Outpatient Prescriptions  Medication Sig Dispense Refill  . hydrALAZINE (APRESOLINE) 25 MG tablet Take 1 tablet (25 mg total) by mouth 3 (three) times daily. 90 tablet 3  . isosorbide dinitrate (ISORDIL) 10 MG tablet Take 1 tablet (10 mg total) by mouth 3 (three) times daily. 90 tablet 3  . LORazepam (ATIVAN) 1 MG tablet Take 1 tablet (1 mg total) by mouth daily as needed for anxiety. (Patient taking differently: Take 1 mg by mouth 2 (two) times daily. ) 30 tablet 0  . multivitamin (RENA-VIT) TABS tablet Take 1 tablet by mouth daily. 90 tablet 3  . oxyCODONE (OXY IR/ROXICODONE) 5 MG immediate release tablet Take 1 tablet (5 mg total) by mouth every 6 (six) hours as needed for severe pain. 90 tablet 0  . sertraline (ZOLOFT) 100 MG tablet Take 1 tablet (100 mg total) by mouth daily. 30 tablet 3  . sevelamer carbonate (RENVELA) 800 MG tablet Take 2-3 tablets (1,600-2,400 mg total) by mouth 3 (three) times daily with meals. 3 tabs with meals, 2 tabs with snacks 120 tablet 3  . apixaban (ELIQUIS) 5 MG TABS tablet Take 1 tablet (5 mg total) by mouth 2 (two) times daily. From 04/09/16 (Patient not taking: Reported on 06/17/2016) 60 tablet 0  . carvedilol (COREG) 12.5 MG tablet Take 1 tablet (12.5 mg total) by mouth 2 (two) times daily. 180 tablet 1  . colchicine 0.6 MG tablet Take 0.5 tablet (0.3 mg total) by mouth two times per week. 8 tablet 3   No current facility-administered medications for this visit.     Allergies:   Aspirin; Tramadol; Vicodin [hydrocodone-acetaminophen]; Buprenorphine hcl; Iohexol; and Morphine and related   Social History:  The patient  reports that she quit smoking about 6 months ago. Her smoking use included Cigarettes. She smoked 0.00 packs per day for 1.00 year. She has never used smokeless tobacco. She reports that she does not drink alcohol or use drugs.   Family History:  The patient's family history includes Hypertension in her father; Polycystic kidney  disease in her father.    ROS:  Please see the history of present illness.   Otherwise, review of systems is positive for chest pain, leg swelling.   All other systems are reviewed and negative.    PHYSICAL EXAM: VS:  BP (!) 126/96   Pulse (!) 102   Ht 5\' 3"  (1.6 m)   Wt 126 lb (57.2 kg)   LMP  (LMP Unknown)   SpO2 99%   BMI 22.32 kg/m  , BMI Body mass index is 22.32 kg/m. GEN: Well nourished, well developed, in no acute distress  HEENT: normal  Neck: no JVD, carotid bruits, or masses Cardiac: tachycardic; no murmurs, rubs, or gallops,no edema  Respiratory:  clear to auscultation bilaterally, normal work of breathing GI: soft, nontender, nondistended, + BS MS: no deformity or atrophy  Skin: warm and dry,  device pocket is well healed Neuro:  Strength and sensation are intact Psych: euthymic mood, full affect  EKG:  EKG is ordered today. The ekg ordered today shows sinus tachycardia, 1 degree AV block, rate 101, lateral TWI   Device interrogation is reviewed today in detail.  See PaceArt for details.   Recent Labs: 04/11/2016: B Natriuretic Peptide >4,500.0 04/13/2016: ALT 7 06/19/2016: BUN 65; Creatinine, Ser 7.90; Hemoglobin 10.8; Platelets 186; Potassium 4.6; Sodium 137    Lipid Panel     Component Value Date/Time   CHOL 137 06/17/2016 0413   TRIG 51 06/17/2016 0413   HDL 58 06/17/2016 0413   CHOLHDL 2.4 06/17/2016 0413   VLDL 10 06/17/2016 0413   LDLCALC 69 06/17/2016 0413     Wt Readings from Last 3 Encounters:  07/04/16 126 lb (57.2 kg)  06/19/16 123 lb 14.4 oz (56.2 kg)  06/06/16 127 lb (57.6 kg)      Other studies Reviewed: Additional studies/ records that were reviewed today include: TTE 06/19/16 Review of the above records today demonstrates:  - Left ventricle: The cavity size was mildly dilated. Wall   thickness was normal. Systolic function was severely reduced. The   estimated ejection fraction was in the range of 20% to 25%.   Diffuse  hypokinesis. The study is not technically sufficient to   allow evaluation of LV diastolic function. - Aortic valve: Sclerosis without stenosis. There was no   regurgitation. - Mitral valve: Mildly thickened leaflets . There was moderate   regurgitation. - Left atrium: Moderately dilated. - Right ventricle: The cavity size was moderately dilated. Pacer   wire or catheter noted in right ventricle. Decreased systolic   function. - Right atrium: The atrium was at the upper limits of normal in   size. Pacer wire or catheter noted in right atrium. - Atrial septum: A septal defect cannot be excluded. The IAS bows   from right to left, suggesting RA pressure > LA pressure. - Tricuspid valve: There was mild regurgitation. More than 35%   mitral inflow variation with respiration. - Pulmonary arteries: PA peak pressure: 28 mm Hg (S). - Systemic veins: The IVC measures <2.1 cm, but does not collapse   >50%, suggesting an elevated RA pressure of 8 mmHg. - Pericardium, extracardiac: Small pericardial effusion. Features   were not consistent with tamponade physiology.   ASSESSMENT AND PLAN:  1.  Nonischemic cardiomyopathy: [employer] scientific ICD in place. Device interrogation shows no episodes of tachycardia. She is currently not on optimal medical therapy. She does take carvedilol and Imdur, but is not on hydralazine. Due to that, we'll start her on 25 mg 3 times a day of hydralazine. We Amalia Edgecombe stop her Norvasc today and increase her Coreg to 12.5 mg twice a day.   2. Chronic kidney disease: Currently on dialysis with a left upper extremity fistula. The fistula had clotted; she has planned operation tomorrow to reroute the fistula. This is a intermediate risk procedure, and she would be at intermediate risk for complications. Would work to continue her carvedilol through the time of the procedure to hopefully decrease her cardiac risks. Would also work to try and keep her in's and outs relatively even  through the procedure.  3. Chest pain: Her chest pain could be cardiac related, as it is worsened when she exerts herself and better when she rests. To further elucidate her chest pain, we'll get a rest stress Myoview. If her stress test is low risk, she would be at intermediate risk for a intermediate risk operation.  4. Pericarditis: Chest pain associated with lying flat and taking a  deep breath consistent with pericarditis. She is a dialysis patient and therefore Yexalen Deike need a lower dose of colchicine. We'll give her 0.3 mg twice a week.  5. Preoperative evaluation: We Leonia Heatherly order a stress test to determine if she has any evidence of coronary disease. It is unlikely due to her age, but she is a dialysis patient with a cardiomyopathy. If her stress test is low risk, she Hlee Fringer be at intermediate risk for an intermediate risk procedure. Current medicines are reviewed at length with the patient today.   The patient does not have concerns regarding her medicines.  The following changes were made today:  Decrease norvasc to 5 mg, start hydralazine 25 mg TID, increase coreg to 12.5 mg BID  Labs/ tests ordered today include:  Orders Placed This Encounter  Procedures  . EKG 12-Lead     Disposition:   FU with Detrell Umscheid 3  months  Signed, Dareon Nunziato Jorja Loa, MD  07/04/2016 8:43 AM     [facility] [address] [place] [address] [phone] (office) [phone] (fax)

## 2016-07-04 ENCOUNTER — Ambulatory Visit (INDEPENDENT_AMBULATORY_CARE_PROVIDER_SITE_OTHER): Payer: Medicare Other | Admitting: Cardiology

## 2016-07-04 ENCOUNTER — Encounter: Payer: Self-pay | Admitting: Cardiology

## 2016-07-04 VITALS — BP 126/96 | HR 102 | Ht 63.0 in | Wt 126.0 lb

## 2016-07-04 DIAGNOSIS — Z95810 Presence of automatic (implantable) cardiac defibrillator: Secondary | ICD-10-CM

## 2016-07-04 DIAGNOSIS — Z45018 Encounter for adjustment and management of other part of cardiac pacemaker: Secondary | ICD-10-CM

## 2016-07-04 DIAGNOSIS — I5042 Chronic combined systolic (congestive) and diastolic (congestive) heart failure: Secondary | ICD-10-CM

## 2016-07-04 DIAGNOSIS — R079 Chest pain, unspecified: Secondary | ICD-10-CM | POA: Diagnosis not present

## 2016-07-04 MED ORDER — COLCHICINE 0.6 MG PO TABS: ORAL_TABLET | ORAL | 3 refills | Status: DC

## 2016-07-04 MED ORDER — CARVEDILOL 12.5 MG PO TABS: 12.5000 mg | ORAL_TABLET | Freq: Two times a day (BID) | ORAL | 1 refills | Status: DC

## 2016-07-04 NOTE — Patient Instructions (Addendum)
Medication Instructions:   Your physician has recommended you make the following change in your medication:  1) INCREASE Carvedilol to 12.5 mg twice a day 2) START Colchicine 0.3 mg two times per week  (side effect of accumulation is diarhhea.  If you begin to experience this symptom stop the medication and inform  our office) 3) STOP Norvasc  --- If you need a refill on your cardiac medications before your next appointment, please call your pharmacy. ---  Labwork:  None ordered  Testing/Procedures: Your physician has requested that you have a lexiscan myoview. For further information please visit [email or web address] Please follow instruction sheet, as given.   Follow-Up:  Your physician recommends that you schedule a follow-up appointment in: 3 months with Dr. Elberta Fortis.   Thank you for choosing [facility]!!   Dory Horn, RN [phone]  Any Other Special Instructions Will Be Listed Below (If Applicable).  Pharmacologic Stress Electrocardiogram A pharmacologic stress electrocardiogram is a heart (cardiac) test that uses nuclear imaging to evaluate the blood supply to your heart. This test may also be called a pharmacologic stress electrocardiography. Pharmacologic means that a medicine is used to increase your heart rate and blood pressure.  This stress test is done to find areas of poor blood flow to the heart by determining the extent of coronary artery disease (CAD). Some people exercise on a treadmill, which naturally increases the blood flow to the heart. For those people unable to exercise on a treadmill, a medicine is used. This medicine stimulates your heart and will cause your heart to beat harder and more quickly, as if you were exercising.  Pharmacologic stress tests can help determine:  The adequacy of blood flow to your heart during increased levels of activity in order to clear you for discharge home.  The extent of coronary artery blockage caused by  CAD.  Your prognosis if you have suffered a heart attack.  The effectiveness of cardiac procedures done, such as an angioplasty, which can increase the circulation in your coronary arteries.  Causes of chest pain or pressure. LET [facility] CARE PROVIDER KNOW ABOUT:  Any allergies you have.  All medicines you are taking, including vitamins, herbs, eye drops, creams, and over-the-counter medicines.  Previous problems you or members of your family have had with the use of anesthetics.  Any blood disorders you have.  Previous surgeries you have had.  Medical conditions you have.  Possibility of pregnancy, if this applies.  If you are currently breastfeeding. RISKS AND COMPLICATIONS Generally, this is a safe procedure. However, as with any procedure, complications can occur. Possible complications include:  You develop pain or pressure in the following areas:  Chest.  Jaw or neck.  Between your shoulder blades.  Radiating down your left arm.  Headache.  Dizziness or light-headedness.  Shortness of breath.  Increased or irregular heartbeat.  Low blood pressure.  Nausea or vomiting.  Flushing.  Redness going up the arm and slight pain during injection of medicine.  Heart attack (rare). BEFORE THE PROCEDURE   Avoid all forms of caffeine for 24 hours before your test or as directed by your health care provider. This includes coffee, tea (even decaffeinated tea), caffeinated sodas, chocolate, cocoa, and certain pain medicines.  Follow your health care provider's instructions regarding eating and drinking before the test.  Take your medicines as directed at regular times with water unless instructed otherwise. Exceptions may include:  If you have diabetes, ask how you are to take your  insulin or pills. It is common to adjust insulin dosing the morning of the test.  If you are taking beta-blocker medicines, it is important to talk to your health care provider  about these medicines well before the date of your test. Taking beta-blocker medicines may interfere with the test. In some cases, these medicines need to be changed or stopped 24 hours or more before the test.  If you wear a nitroglycerin patch, it may need to be removed prior to the test. Ask your health care provider if the patch should be removed before the test.  If you use an inhaler for any breathing condition, bring it with you to the test.  If you are an outpatient, bring a snack so you can eat right after the stress phase of the test.  Do not smoke for 4 hours prior to the test or as directed by your health care provider.  Do not apply lotions, powders, creams, or oils on your chest prior to the test.  Wear comfortable shoes and clothing. Let your health care provider know if you were unable to complete or follow the preparations for your test. PROCEDURE   Multiple patches (electrodes) will be put on your chest. If needed, small areas of your chest may be shaved to get better contact with the electrodes. Once the electrodes are attached to your body, multiple wires will be attached to the electrodes, and your heart rate will be monitored.  An IV access will be started. A nuclear trace (isotope) is given. The isotope may be given intravenously, or it may be swallowed. Nuclear refers to several types of radioactive isotopes, and the nuclear isotope lights up the arteries so that the nuclear images are clear. The isotope is absorbed by your body. This results in low radiation exposure.  A resting nuclear image is taken to show how your heart functions at rest.  A medicine is given through the IV access.  A second scan is done about 1 hour after the medicine injection and determines how your heart functions under stress.  During this stress phase, you will be connected to an electrocardiogram machine. Your blood pressure and oxygen levels will be monitored. AFTER THE PROCEDURE    Your heart rate and blood pressure will be monitored after the test.  You may return to your normal schedule, including diet,activities, and medicines, unless your health care provider tells you otherwise.   This information is not intended to replace advice given to you by your health care provider. Make sure you discuss any questions you have with your health care provider.   Document Released: 02/25/2009 Document Revised: 10/14/2013 Document Reviewed: 06/16/2013 Elsevier Interactive Patient Education [employer]2016 Elsevier Inc.

## 2016-07-12 ENCOUNTER — Encounter
Admission: RE | Disposition: A | Discharge status: Home / Self Care | Payer: Self-pay | Source: Ambulatory Visit | Attending: Vascular Surgery

## 2016-07-12 ENCOUNTER — Ambulatory Visit
Admission: RE | Admit: 2016-07-12 | Discharge: 2016-07-12 | Disposition: A | Discharge status: Home / Self Care | Payer: Medicare Other | Source: Ambulatory Visit | Attending: Vascular Surgery | Admitting: Vascular Surgery

## 2016-07-12 ENCOUNTER — Other Ambulatory Visit: Payer: Self-pay | Admitting: Vascular Surgery

## 2016-07-12 DIAGNOSIS — I499 Cardiac arrhythmia, unspecified: Secondary | ICD-10-CM | POA: Insufficient documentation

## 2016-07-12 DIAGNOSIS — D571 Sickle-cell disease without crisis: Secondary | ICD-10-CM | POA: Diagnosis not present

## 2016-07-12 DIAGNOSIS — T8241XA Breakdown (mechanical) of vascular dialysis catheter, initial encounter: Secondary | ICD-10-CM | POA: Insufficient documentation

## 2016-07-12 DIAGNOSIS — Z888 Allergy status to other drugs, medicaments and biological substances status: Secondary | ICD-10-CM | POA: Diagnosis not present

## 2016-07-12 DIAGNOSIS — K219 Gastro-esophageal reflux disease without esophagitis: Secondary | ICD-10-CM | POA: Diagnosis not present

## 2016-07-12 DIAGNOSIS — N186 End stage renal disease: Secondary | ICD-10-CM | POA: Insufficient documentation

## 2016-07-12 DIAGNOSIS — Y838 Other surgical procedures as the cause of abnormal reaction of the patient, or of later complication, without mention of misadventure at the time of the procedure: Secondary | ICD-10-CM | POA: Insufficient documentation

## 2016-07-12 DIAGNOSIS — Q613 Polycystic kidney, unspecified: Secondary | ICD-10-CM | POA: Diagnosis not present

## 2016-07-12 DIAGNOSIS — Z8249 Family history of ischemic heart disease and other diseases of the circulatory system: Secondary | ICD-10-CM | POA: Diagnosis not present

## 2016-07-12 DIAGNOSIS — I132 Hypertensive heart and chronic kidney disease with heart failure and with stage 5 chronic kidney disease, or end stage renal disease: Secondary | ICD-10-CM | POA: Insufficient documentation

## 2016-07-12 DIAGNOSIS — T8611 Kidney transplant rejection: Secondary | ICD-10-CM | POA: Insufficient documentation

## 2016-07-12 DIAGNOSIS — Z7901 Long term (current) use of anticoagulants: Secondary | ICD-10-CM | POA: Insufficient documentation

## 2016-07-12 DIAGNOSIS — Z886 Allergy status to analgesic agent status: Secondary | ICD-10-CM | POA: Insufficient documentation

## 2016-07-12 DIAGNOSIS — Z905 Acquired absence of kidney: Secondary | ICD-10-CM | POA: Insufficient documentation

## 2016-07-12 DIAGNOSIS — Z885 Allergy status to narcotic agent status: Secondary | ICD-10-CM | POA: Insufficient documentation

## 2016-07-12 DIAGNOSIS — Z86711 Personal history of pulmonary embolism: Secondary | ICD-10-CM | POA: Diagnosis not present

## 2016-07-12 DIAGNOSIS — I509 Heart failure, unspecified: Secondary | ICD-10-CM | POA: Diagnosis not present

## 2016-07-12 DIAGNOSIS — F418 Other specified anxiety disorders: Secondary | ICD-10-CM | POA: Insufficient documentation

## 2016-07-12 DIAGNOSIS — Z992 Dependence on renal dialysis: Secondary | ICD-10-CM | POA: Insufficient documentation

## 2016-07-12 DIAGNOSIS — M810 Age-related osteoporosis without current pathological fracture: Secondary | ICD-10-CM | POA: Diagnosis not present

## 2016-07-12 DIAGNOSIS — Z87891 Personal history of nicotine dependence: Secondary | ICD-10-CM | POA: Diagnosis not present

## 2016-07-12 DIAGNOSIS — Z9115 Patient's noncompliance with renal dialysis: Secondary | ICD-10-CM | POA: Insufficient documentation

## 2016-07-12 DIAGNOSIS — Z841 Family history of disorders of kidney and ureter: Secondary | ICD-10-CM | POA: Insufficient documentation

## 2016-07-12 HISTORY — PX: PERIPHERAL VASCULAR CATHETERIZATION: SHX172C

## 2016-07-12 LAB — POTASSIUM (ARMC VASCULAR LAB ONLY): POTASSIUM (ARMC VASCULAR LAB): 7 — AB (ref 3.5–5.1)

## 2016-07-12 SURGERY — DIALYSIS/PERMA CATHETER INSERTION
Anesthesia: Moderate Sedation

## 2016-07-12 MED ORDER — ONDANSETRON HCL 4 MG/2ML IJ SOLN: 4.0000 mg | Freq: Four times a day (QID) | INTRAMUSCULAR | Status: DC | PRN

## 2016-07-12 MED ORDER — SODIUM CHLORIDE 0.9 % IV SOLN
INTRAVENOUS | Status: DC
  Administered 2016-07-12: 17:00:00 via INTRAVENOUS

## 2016-07-12 MED ORDER — HYDROMORPHONE HCL 1 MG/ML IJ SOLN
INTRAMUSCULAR | Status: AC
  Administered 2016-07-12: 1 mg via INTRAVENOUS
  Filled 2016-07-12: qty 1

## 2016-07-12 MED ORDER — HEPARIN SODIUM (PORCINE) 10000 UNIT/ML IJ SOLN
INTRAMUSCULAR | Status: AC
  Filled 2016-07-12: qty 1

## 2016-07-12 MED ORDER — DIPHENHYDRAMINE HCL 50 MG/ML IJ SOLN
INTRAMUSCULAR | Status: AC
  Filled 2016-07-12: qty 1

## 2016-07-12 MED ORDER — MIDAZOLAM HCL 5 MG/5ML IJ SOLN
INTRAMUSCULAR | Status: AC
  Filled 2016-07-12: qty 5

## 2016-07-12 MED ORDER — HYDROMORPHONE HCL 1 MG/ML IJ SOLN
1.0000 mg | Freq: Once | INTRAMUSCULAR | Status: AC
  Administered 2016-07-12: 1 mg via INTRAVENOUS

## 2016-07-12 MED ORDER — METHYLPREDNISOLONE SODIUM SUCC 125 MG IJ SOLR: 125.0000 mg | INTRAMUSCULAR | Status: DC | PRN

## 2016-07-12 MED ORDER — FENTANYL CITRATE (PF) 100 MCG/2ML IJ SOLN
INTRAMUSCULAR | Status: AC
  Filled 2016-07-12: qty 2

## 2016-07-12 MED ORDER — FENTANYL CITRATE (PF) 100 MCG/2ML IJ SOLN
INTRAMUSCULAR | Status: DC | PRN
  Administered 2016-07-12: 50 ug via INTRAVENOUS

## 2016-07-12 MED ORDER — LIDOCAINE-EPINEPHRINE (PF) 1 %-1:200000 IJ SOLN
INTRAMUSCULAR | Status: AC
  Filled 2016-07-12: qty 30

## 2016-07-12 MED ORDER — MIDAZOLAM HCL 2 MG/2ML IJ SOLN
INTRAMUSCULAR | Status: DC | PRN
  Administered 2016-07-12: 2 mg via INTRAVENOUS

## 2016-07-12 MED ORDER — HEPARIN (PORCINE) IN NACL 2-0.9 UNIT/ML-% IJ SOLN
INTRAMUSCULAR | Status: AC
  Filled 2016-07-12: qty 500

## 2016-07-12 MED ORDER — DEXTROSE 5 % IV SOLN
1.5000 g | INTRAVENOUS | Status: AC
  Administered 2016-07-12: 1.5 g via INTRAVENOUS

## 2016-07-12 MED ORDER — FAMOTIDINE 20 MG PO TABS: 40.0000 mg | ORAL_TABLET | ORAL | Status: DC | PRN

## 2016-07-12 MED ORDER — DIPHENHYDRAMINE HCL 50 MG/ML IJ SOLN
INTRAMUSCULAR | Status: DC | PRN
  Administered 2016-07-12: 50 mg via INTRAVENOUS

## 2016-07-12 SURGICAL SUPPLY — 8 items
CATH PALINDROME-P 23CM W/VT (CATHETERS) ×2 IMPLANT
CATH PALINDROME-P 44CM KIT (CATHETERS)
DERMABOND ADVANCED (GAUZE/BANDAGES/DRESSINGS) ×1
DERMABOND ADVANCED .7 DNX12 (GAUZE/BANDAGES/DRESSINGS) ×1 IMPLANT
GLIDEWIRE ADV .035X180CM (WIRE) ×2 IMPLANT
KIT CATH 64X44X15FR RVRS (CATHETERS) IMPLANT
PACK ANGIOGRAPHY (CUSTOM PROCEDURE TRAY) ×2 IMPLANT
TOWEL OR 17X26 4PK STRL BLUE (TOWEL DISPOSABLE) ×2 IMPLANT

## 2016-07-12 NOTE — Op Note (Signed)
OPERATIVE NOTE    PRE-OPERATIVE DIAGNOSIS: 1. ESRD 2. Clotted AVG POST-OPERATIVE DIAGNOSIS: same as above  PROCEDURE: 1. Ultrasound guidance for vascular access to the left femoral  vein 2. Fluoroscopic guidance for placement of catheter 3. Placement of a 23 cm tip to cuff tunneled hemodialysis catheter via the left femoral vein  SURGEON: Zelma Snead, MD  ANESTHESIA:  Local with moderate conscious sedation for approximately 20 minutes using 2 mg of Versed and 50 mcg of Fentanyl  ESTIMATED BLOOD LOSS: 25 cc  FINDING(S): 1.  Patent left femoral vein, Obteze filter in the IVC  SPECIMEN(S):  None  INDICATIONS:   Patient is a 25 y.o.female who presents with a clotted graft.  She has not had HD in a week.   The patient needs long term dialysis access for their ESRD, and a Permcath is necessary.  Risks and benefits are discussed and informed consent is obtained.    DESCRIPTION: After obtaining full informed written consent, the patient was brought back to the vascular suited. Moderate conscious sedation was administered throughout the procedure with a face-to-face encounter with my presence for the entire procedure and with my supervision of the RN monitoring the patient's vital signs, pulse oximetry, telemetry, and mental status throughout the procedure. The patient's left groin was sterilely prepped and draped and a sterile surgical field was created.  The left femoral vein was visualized with ultrasound and found to be patent. It was then accessed under direct ultrasound guidance and a permanent image was recorded. A wire was placed. After skin nick and dilatation, the peel-away sheath was placed over the wire. I then turned my attention to an area about 4 cm inferior and lateral to the access incision and a small counterincision was created.  I tunneled from the counter  incision to the access site. Using fluoroscopic guidance, a 23 centimeterer tip to cuff tunneled hemodialysis catheter was  selected, and tunneled from the counter incision to the access site. It was then placed through the peel-away sheath and the peel-away sheath was removed. Using fluoroscopic guidance the catheter tips were parked in the IVC just below the filter. The appropriate distal connectors were placed. It withdrew blood well and flushed easily with heparinized saline and a concentrated heparin solution was then placed. It was secured to the leg  with 2 Prolene sutures. The access incision was closed single 4-0 Monocryl. A 4-0 Monocryl pursestring suture was placed around the exit site. Sterile dressings were placed. The patient tolerated the procedure well and was taken to the recovery room in stable condition.  COMPLICATIONS: None  CONDITION: Stable    Halcyon Heck 07/12/2016 5:15 PM

## 2016-07-12 NOTE — Discharge Instructions (Signed)
Central Line Dialysis Access Placement, Care After Refer to this sheet after your procedure. These instructions provide you with information on caring for yourself after your procedure. Your health care provider may also give you more specific instructions. Your treatment has been planned according to current medical practices, but problems sometimes occur. Call your health care provider if you have any problems or questions after your procedure.  WHAT TO EXPECT AFTER THE PROCEDURE  You may feel some discomfort after the local anesthetic wears off. Your discomfort should gradually improve over the next several days. Ask your health care provider if you can take pain medicines.  You may notice some redness, mild pain, swelling, bruising, or light bleeding at the site where the thin, flexible tube (catheter) was placed. These should improve over the next several days. HOME CARE INSTRUCTIONS   Rest for the remainder of the day.  Avoid any heavy lifting (more than 10 lb [4.5 kg]) for at least 3 days.   If your catheter bandage (dressing) becomes soaked with blood, apply firm and direct pressure on the insertion site and sit up for 20 minutes. Your dialysis nurses will change your dressing during your next visit or at your next dialysis treatment.  Keep the dressing around the insertion site dry. Avoid using the shower. You may take a bath after 24 hours. Bathe in a tub and keep the dressing and catheter above the level of the water. Do not submerge catheter underwater, such as by swimming.  You may resume your usual diet.  Do not operate heavy machinery, drive, or make legal decisions for the first 24 hours after the procedure if you were given sedatives or other medicines to help you relax.  SEEK MEDICAL CARE IF:  The catheter gets pulled or comes out.  You develop any signs of infection around the insertion site such as:   Bleeding that does not stop even after applying pressure as  instructed.  Increased pain.  Unusual drainage such as pus.   You develop a fever or chills.   You have any other questions or concerns related to your procedure or the care of your central line.  SEEK IMMEDIATE MEDICAL CARE IF:  You develop lightheadedness or dizziness.   You faint.   You develop shortness of breath or difficulty breathing.   You have any symptoms of an allergic reaction, such as:  Itching.   Rash at the site of insertion.   This information is not intended to replace advice given to you by your health care provider. Make sure you discuss any questions you have with your health care provider.   Document Released: 05/23/2004 Document Revised: 10/14/2013 Document Reviewed: 07/18/2012 Elsevier Interactive Patient Education [employer]2016 [employer] , Care After Refer to this sheet in the next few weeks. These instructions provide you with information on caring for yourself after your procedure. Your caregiver may also give you more specific instructions. Your treatment has been planned according to current medical practices, but problems sometimes occur. Call your caregiver if you have any problems or questions after your procedure.  HOME CARE INSTRUCTIONS  Rest at home the day of the procedure. You will likely be able to return to normal activities the following day.  Follow your caregiver's specific instructions for the type of device that you have.  Only take over-the-counter or prescription medicines as directed by your caregiver.  Keep the insertion site of the catheter clean and dry at all times.  Change the bandages (dressings)  over the catheter site as directed by your caregiver.  Wash the area around the catheter site during each dressing change. Sponge bathe the area using a germ-killing (antiseptic) solution as directed by your caregiver.  Look for redness or swelling at the insertion site during each dressing change.  Apply an antibiotic  ointment as directed by your caregiver.  Flush your catheter as directed to keep it from becoming clogged.  Always wash your hands thoroughly before changing dressings or flushing the catheter.  Do notlet air enter the catheter.  Never open the cap at the catheter tip.  Always make sure there is no air in the syringe or in the tubing for infusions.   Do notlift anything heavy.  Do not drive until your caregiver approves.  Do not shower or bathe until your caregiver approves. When you shower or bathe, place a piece of plastic wrap over the catheter site. Do not allow the catheter site or the dressing to get wet. If taking a bath, do not allow the catheter to get submerged in the water. If the catheter was inserted through an arm vein:  Avoid wearing tight clothes or jewelry on the arm that has the catheter.   Do not sleep with your head on the arm that has the catheter.   Do not allow use of a blood pressure cuff on the arm that has the catheter.   Do not let anyone draw blood from the arm that has the catheter, except through the catheter itself. SEEK MEDICAL CARE IF:  You have bleeding at the insertion site of the catheter.   You feel weak or nauseous.   Your catheter is not working properly.   You have redness, pain, swelling, and warmth at the insertion site.   You notice fluid draining from the insertion site.  SEEK IMMEDIATE MEDICAL CARE IF:  Your catheter breaks or has a hole in it.   Your catheter comes loose or gets pulled completely out. If this happens, hold firm pressure over the area with your hand or a clean cloth.   You have a fever.  You have chills.   Your catheter becomes totally blocked.   You have swelling in your arm, shoulder, neck, or face.   You have bleeding from the insertion site that does not stop.   You develop chest pain or have trouble breathing.   You feel dizzy or faint.  MAKE SURE YOU:  Understand these  instructions.  Will watch your condition.  Will get help right away if you are not doing well or get worse.   This information is not intended to replace advice given to you by your health care provider. Make sure you discuss any questions you have with your health care provider.   Document Released: 09/25/2012 Document Revised: 06/11/2013 Document Reviewed: 09/25/2012 Elsevier Interactive Patient Education [employer]

## 2016-07-12 NOTE — H&P (Signed)
[facility] Admission History & Physical  MRN : [id number]  [name] Small is a 25 y.o. (September 04, 1991) female who presents with chief complaint of No chief complaint on file. Marland Kitchen  History of Present Illness: Patient is sent from her dialysis access center for a clotted graft. Apparently, she has been noncompliant with her anticoagulation and has not had dialysis in 1 week. She has some swelling and volume overload signs today. She needs dialysis access immediately.  Current Facility-Administered Medications  Medication Dose Route Frequency Provider Last Rate Last Dose  . 0.9 %  sodium chloride infusion   Intravenous Continuous Kimberly A Stegmayer, PA-C      . cefUROXime (ZINACEF) 1.5 g in dextrose 5 % 50 mL IVPB  1.5 g Intravenous 30 min Pre-Op Kimberly A Stegmayer, PA-C      . famotidine (PEPCID) tablet 40 mg  40 mg Oral PRN Kimberly A Stegmayer, PA-C      . methylPREDNISolone sodium succinate (SOLU-MEDROL) 125 mg/2 mL injection 125 mg  125 mg Intravenous PRN Kimberly A Stegmayer, PA-C      . ondansetron (ZOFRAN) injection 4 mg  4 mg Intravenous Q6H PRN Tonette Lederer, PA-C        Past Medical History:  Diagnosis Date  . 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 Anemia   . Anxiety   . Cardiomyopathy   . Cellulitis and abscess of face 03/22/2013  . CHF (congestive heart failure) (HCC)   . Chronic anticoagulation   . Chronic pain   . Depression   . Dysrhythmia    at times per pt.  . 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 ESRD (end stage renal disease) on dialysis [facility])    "MWF;  [facility]" (04/11/2016)  . GERD (gastroesophageal reflux disease)   . H/O transfusion of packed red blood cells   . Hemodialysis patient (HCC)    Mon. Wed. Fri  . Hyperkalemia 09/2015  . Hypertension   . Narcotic  abuse, continuous   . Osteoporosis   . Pelvic fracture (HCC) 06/2015   "on the right"  . Polycystic kidney disease   . Pulmonary emboli (HCC) 01/2012   Bilateral, moderate clot burden, areas of pulmonary infarction and central necrosis  . Renal insufficiency   . Sickle cell anemia (HCC)     Past Surgical History:  Procedure Laterality Date  . AV FISTULA PLACEMENT Bilateral    "right side stopped working & never had it revised" (04/11/2016)  . CARDIAC CATHETERIZATION    . IMPLANTABLE CARDIOVERTER DEFIBRILLATOR IMPLANT Right 12/2014  . INCISION AND DRAINAGE ABSCESS Right 03/21/2013   Procedure: INCISION AND DRAINAGE RIGHT CHEEK ABSCESS REMOVAL OF FOREIGN BODY;  Surgeon: Serena Colonel, MD;  Location: [facility] OR;  Service: ENT;  Laterality: Right;  . INSERTION OF DIALYSIS CATHETER Right Mar 09, 2016   thigh, Dr. Wyn Quaker, [facility]  . 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 NEPHRECTOMY Right    third kidney placed in 2008, and body rejected in 2012   . PERIPHERAL VASCULAR CATHETERIZATION Left 03/09/2016   Procedure: A/V Shuntogram/Fistulagram;  Surgeon: Annice Needy, MD;  Location: [facility] INVASIVE CV LAB;  Service: Cardiovascular;  Laterality: Left;  . PERIPHERAL VASCULAR CATHETERIZATION N/A 05/09/2016   Procedure: Dialysis/Perma Catheter Removal;  Surgeon: Renford Dills, MD;  Location: [facility] INVASIVE CV LAB;  Service: Cardiovascular;  Laterality: N/A;  .  REVISON OF ARTERIOVENOUS FISTULA Left 03/22/2016   Procedure: REVISON OF ARTERIOVENOUS FISTULA ( ARTEGRAFT );  Surgeon: Annice Needy, MD;  Location: [facility] ORS;  Service: Vascular;  Laterality: Left;  . TONSILLECTOMY AND ADENOIDECTOMY  ~ 2000    Social History Social History  Substance Use Topics  . Smoking status: Former Smoker    Packs/day: 0.00    Years: 1.00    Types: Cigarettes    Quit date: 12/08/2015  . Smokeless tobacco: Never Used  . Alcohol use No  No IVDU  Family History Family History  Problem Relation Age of Onset  . Polycystic kidney  disease Father   . Hypertension Father   No bleeding disorders, clotting disorders, or aneurysms  Allergies  Allergen Reactions  . Aspirin Other (See Comments)    Interacts with Coreg  . Tramadol Anaphylaxis  . Vicodin [Hydrocodone-Acetaminophen] Hives  . Buprenorphine Hcl Itching and Other (See Comments)    Ok with oxycodone  . Iohexol Itching  . Morphine And Related Itching and Other (See Comments)    Ok with oxycodone     REVIEW OF SYSTEMS (Negative unless checked)  Constitutional: [] Weight loss  [] Fever  [] Chills Cardiac: [] Chest pain   [] Chest pressure   [x] Palpitations   [] Shortness of breath when laying flat   [] Shortness of breath at rest   [x] Shortness of breath with exertion. Vascular:  [] Pain in legs with walking   [] Pain in legs at rest   [] Pain in legs when laying flat   [] Claudication   [] Pain in feet when walking  [] Pain in feet at rest  [] Pain in feet when laying flat   [] History of DVT   [] Phlebitis   [x] Swelling in legs   [] Varicose veins   [] Non-healing ulcers Pulmonary:   [] Uses home oxygen   [] Productive cough   [] Hemoptysis   [] Wheeze  [] COPD   [] Asthma Neurologic:  [] Dizziness  [] Blackouts   [] Seizures   [] History of stroke   [] History of TIA  [] Aphasia   [] Temporary blindness   [] Dysphagia   [] Weakness or numbness in arms   [] Weakness or numbness in legs Musculoskeletal:  [] Arthritis   [] Joint swelling   [] Joint pain   [] Low back pain Hematologic:  [] Easy bruising  [] Easy bleeding   [] Hypercoagulable state   [] Anemic  [] Hepatitis Gastrointestinal:  [] Blood in stool   [] Vomiting blood  [] Gastroesophageal reflux/heartburn   [] Difficulty swallowing. Genitourinary:  [x] Chronic kidney disease   [] Difficult urination  [] Frequent urination  [] Burning with urination   [] Blood in urine Skin:  [] Rashes   [] Ulcers   [] Wounds Psychological:  [] History of anxiety   []  History of major depression.  Physical Examination  Vitals:   07/12/16 1601  BP: (!) 151/131  Pulse:  94  Resp: 20  Temp: 98 F (36.7 C)  TempSrc: Oral  SpO2: 100%   There is no height or weight on file to calculate BMI. Gen: WD/WN, NAD. Appears older than stated age Head: [place]/AT, No temporalis wasting. Prominent temp pulse not noted. Ear/Nose/Throat: Hearing grossly intact, nares w/o erythema or drainage, oropharynx w/o Erythema/Exudate,  Eyes: PERRLA, EOMI.  Neck: Supple, no nuchal rigidity.  No JVD.  Pulmonary:  Good air movement, no use of accessory muscles.  Cardiac: irregularly irregular Vascular: no thrill or bruit in left arm AVG Vessel Right Left  Radial Palpable Palpable  Gastrointestinal: soft, non-tender/non-distended. No guarding/reflex.  Musculoskeletal: M/S 5/5 throughout.  Extremities without ischemic changes.  No deformity or atrophy. 2+ LE edema Neurologic: CN 2-12 intact. Pain and light touch intact in extremities.  Symmetrical.  Speech is fluent. Motor exam as listed above. Psychiatric: Judgment intact, Mood & affect appropriate for pt's clinical situation. Dermatologic: No rashes or ulcers noted.  No cellulitis or open wounds. Lymph : No Cervical, Axillary, or Inguinal lymphadenopathy.     CBC Lab Results  Component Value Date   WBC 6.1 06/19/2016   HGB 10.8 (L) 06/19/2016   HCT 34.8 (L) 06/19/2016   MCV 92.3 06/19/2016   PLT 186 06/19/2016    BMET    Component Value Date/Time   NA 137 06/19/2016 0700   K 4.6 06/19/2016 0700   CL 94 (L) 06/19/2016 0700   CO2 26 06/19/2016 0700   GLUCOSE 80 06/19/2016 0700   BUN 65 (H) 06/19/2016 0700   CREATININE 7.90 (H) 06/19/2016 0700   CALCIUM 10.6 (H) 06/19/2016 0700   GFRNONAA 6 (L) 06/19/2016 0700   GFRAA 7 (L) 06/19/2016 0700   CrCl cannot be calculated (Patient's most recent lab result is older than the maximum 21 days allowed.).  COAG Lab Results  Component Value Date   INR 1.35 06/17/2016   INR 1.55 (H) 04/12/2016   INR 1.35 03/31/2016     Radiology Dg Chest 2 View  Result Date: 06/17/2016 CLINICAL DATA:  Lambert ModySharp non radiating chest pain starting today. Shortness of breath. History of hypertension and atrial fibrillation. EXAM: CHEST  2 VIEW COMPARISON:  06/03/2016 FINDINGS: Cardiac pacemaker. Diffuse cardiac enlargement with normal pulmonary vascularity. No focal airspace disease or consolidation in the lungs. Blunting of the right costophrenic angle suggests small pleural effusion. No pneumothorax. IMPRESSION: Cardiac enlargement similar previous study. No evidence of active pulmonary disease. Small right pleural effusion. Electronically Signed   By: Burman NievesWilliam  Stevens M.D.   On: 06/17/2016 01:59   Ct Angio Chest Pe W Or Wo Contrast  Result Date: 06/17/2016 CLINICAL DATA:  Chest pain. EXAM: CT ANGIOGRAPHY CHEST WITH CONTRAST TECHNIQUE: Multidetector CT imaging of the chest was performed using the standard protocol during bolus administration of intravenous contrast. Multiplanar CT image reconstructions and MIPs were obtained to evaluate the vascular anatomy. CONTRAST:  80 cc Isovue 370 IV. Patient was pre-medicated due to history of itching. No difficulties. COMPARISON:  04/12/2016 FINDINGS: Cardiovascular: There is cardiomegaly. Moderate pericardial effusion. No evidence of aortic aneurysm. No filling defects in the pulmonary arteries to suggest pulmonary emboli. Mediastinum/Nodes: No mediastinal, hilar, or axillary adenopathy. Lungs/Pleura: Mild vascular congestion. Small bilateral pleural effusions. Linear subsegmental atelectasis at the left base. No confluent opacities. Upper Abdomen: Imaging into the upper abdomen shows no acute findings. Musculoskeletal: No acute bony abnormality or focal bone lesion. Chest wall soft tissues are unremarkable. Right chest wall pacer noted with single lead tip in the right ventricle. Review of the MIP images confirms the above findings. IMPRESSION: No evidence of pulmonary embolus. Marked cardiomegaly.   Moderate pericardial effusion. Small pleural effusions. Electronically Signed   By: Charlett NoseKevin  Dover M.D.   On: 06/17/2016 10:13     Assessment/Plan 1. Complication of dialysis access. Graft is clotted.  Patient has not had dialysis in a week.  Will need to place a permcath now for dialysis access.  May be able to open the graft after she gets HD treatments. 2. ESRD. Needs access as above as no HD in one week 3. Cardiac arrhythmias/chronic cardiac  disease. Has a defibrillator pacemaker on the right side. Has had to have femoral access previously, and I suspect this will be needed again.    Maydelin Deming, MD  07/12/2016 4:33 PM

## 2016-07-13 ENCOUNTER — Telehealth (HOSPITAL_COMMUNITY): Payer: Self-pay | Admitting: *Deleted

## 2016-07-13 ENCOUNTER — Encounter: Payer: Self-pay | Admitting: Vascular Surgery

## 2016-07-13 NOTE — Telephone Encounter (Signed)
Left message on voicemail per DPR in reference to upcoming appointment scheduled on  07/18/16 with detailed instructions given per Myocardial Perfusion Study Information Sheet for the test. LM to arrive 15 minutes early, and that it is imperative to arrive on time for appointment to keep from having the test rescheduled. If you need to cancel or reschedule your appointment, please call the office within 24 hours of your appointment. Failure to do so may result in a cancellation of your appointment, and a $50 no show fee. Phone number given for call back for any questions. Ricky Ala, RN

## 2016-07-18 ENCOUNTER — Ambulatory Visit (HOSPITAL_COMMUNITY): Payer: Medicare Other | Attending: Cardiology

## 2016-07-18 DIAGNOSIS — R0609 Other forms of dyspnea: Secondary | ICD-10-CM | POA: Diagnosis not present

## 2016-07-18 DIAGNOSIS — D571 Sickle-cell disease without crisis: Secondary | ICD-10-CM | POA: Insufficient documentation

## 2016-07-18 DIAGNOSIS — R9439 Abnormal result of other cardiovascular function study: Secondary | ICD-10-CM | POA: Insufficient documentation

## 2016-07-18 DIAGNOSIS — I509 Heart failure, unspecified: Secondary | ICD-10-CM | POA: Diagnosis not present

## 2016-07-18 DIAGNOSIS — I110 Hypertensive heart disease with heart failure: Secondary | ICD-10-CM | POA: Diagnosis not present

## 2016-07-18 DIAGNOSIS — R079 Chest pain, unspecified: Secondary | ICD-10-CM | POA: Insufficient documentation

## 2016-07-18 DIAGNOSIS — R0602 Shortness of breath: Secondary | ICD-10-CM | POA: Insufficient documentation

## 2016-07-18 LAB — MYOCARDIAL PERFUSION IMAGING
CHL CUP NUCLEAR SDS: 7
CHL CUP NUCLEAR SRS: 3
CHL CUP NUCLEAR SSS: 10
CHL CUP RESTING HR STRESS: 103 {beats}/min
LV dias vol: 268 mL (ref 46–106)
LV sys vol: 236 mL
NUC STRESS TID: 0.97
Peak HR: 111 {beats}/min
RATE: 0.38

## 2016-07-18 MED ORDER — TECHNETIUM TC 99M TETROFOSMIN IV KIT
31.2000 | PACK | Freq: Once | INTRAVENOUS | Status: AC | PRN
  Administered 2016-07-18: 31.2 via INTRAVENOUS
  Filled 2016-07-18: qty 31

## 2016-07-18 MED ORDER — REGADENOSON 0.4 MG/5ML IV SOLN
0.4000 mg | Freq: Once | INTRAVENOUS | Status: AC
  Administered 2016-07-18: 0.4 mg via INTRAVENOUS

## 2016-07-18 MED ORDER — AMINOPHYLLINE 25 MG/ML IV SOLN
75.0000 mg | Freq: Once | INTRAVENOUS | Status: AC
  Administered 2016-07-18: 75 mg via INTRAVENOUS

## 2016-07-18 MED ORDER — TECHNETIUM TC 99M TETROFOSMIN IV KIT
10.9000 | PACK | Freq: Once | INTRAVENOUS | Status: AC | PRN
  Administered 2016-07-18: 10.9 via INTRAVENOUS
  Filled 2016-07-18: qty 11

## 2016-07-19 ENCOUNTER — Telehealth: Payer: Self-pay

## 2016-07-20 ENCOUNTER — Other Ambulatory Visit: Payer: Self-pay | Admitting: Internal Medicine

## 2016-07-20 DIAGNOSIS — N186 End stage renal disease: Secondary | ICD-10-CM

## 2016-07-20 DIAGNOSIS — Z992 Dependence on renal dialysis: Secondary | ICD-10-CM

## 2016-07-20 DIAGNOSIS — G894 Chronic pain syndrome: Secondary | ICD-10-CM

## 2016-07-20 DIAGNOSIS — F411 Generalized anxiety disorder: Secondary | ICD-10-CM

## 2016-07-20 MED ORDER — LORAZEPAM 1 MG PO TABS: 1.0000 mg | ORAL_TABLET | Freq: Every day | ORAL | 0 refills | Status: DC | PRN

## 2016-07-20 MED ORDER — OXYCODONE HCL 5 MG PO TABS: 5.0000 mg | ORAL_TABLET | Freq: Four times a day (QID) | ORAL | 0 refills | Status: DC | PRN

## 2016-07-23 ENCOUNTER — Emergency Department (HOSPITAL_BASED_OUTPATIENT_CLINIC_OR_DEPARTMENT_OTHER)
Admission: EM | Admit: 2016-07-23 | Discharge: 2016-07-24 | Disposition: A | Discharge status: Home / Self Care | Payer: Medicare Other | Source: Home / Self Care | Attending: Emergency Medicine | Admitting: Emergency Medicine

## 2016-07-23 ENCOUNTER — Encounter (HOSPITAL_BASED_OUTPATIENT_CLINIC_OR_DEPARTMENT_OTHER): Payer: Self-pay | Admitting: *Deleted

## 2016-07-23 ENCOUNTER — Emergency Department (HOSPITAL_BASED_OUTPATIENT_CLINIC_OR_DEPARTMENT_OTHER): Payer: Medicare Other

## 2016-07-23 DIAGNOSIS — I5042 Chronic combined systolic (congestive) and diastolic (congestive) heart failure: Secondary | ICD-10-CM | POA: Insufficient documentation

## 2016-07-23 DIAGNOSIS — Z992 Dependence on renal dialysis: Secondary | ICD-10-CM | POA: Insufficient documentation

## 2016-07-23 DIAGNOSIS — Y999 Unspecified external cause status: Secondary | ICD-10-CM | POA: Insufficient documentation

## 2016-07-23 DIAGNOSIS — Z87891 Personal history of nicotine dependence: Secondary | ICD-10-CM

## 2016-07-23 DIAGNOSIS — Y939 Activity, unspecified: Secondary | ICD-10-CM

## 2016-07-23 DIAGNOSIS — Y929 Unspecified place or not applicable: Secondary | ICD-10-CM

## 2016-07-23 DIAGNOSIS — N186 End stage renal disease: Secondary | ICD-10-CM | POA: Insufficient documentation

## 2016-07-23 DIAGNOSIS — S22069A Unspecified fracture of T7-T8 vertebra, initial encounter for closed fracture: Secondary | ICD-10-CM

## 2016-07-23 DIAGNOSIS — I132 Hypertensive heart and chronic kidney disease with heart failure and with stage 5 chronic kidney disease, or end stage renal disease: Secondary | ICD-10-CM

## 2016-07-23 DIAGNOSIS — S22000A Wedge compression fracture of unspecified thoracic vertebra, initial encounter for closed fracture: Secondary | ICD-10-CM

## 2016-07-23 DIAGNOSIS — E875 Hyperkalemia: Secondary | ICD-10-CM | POA: Diagnosis not present

## 2016-07-23 DIAGNOSIS — R0602 Shortness of breath: Secondary | ICD-10-CM | POA: Diagnosis not present

## 2016-07-23 DIAGNOSIS — S22009A Unspecified fracture of unspecified thoracic vertebra, initial encounter for closed fracture: Secondary | ICD-10-CM

## 2016-07-23 DIAGNOSIS — W0110XA Fall on same level from slipping, tripping and stumbling with subsequent striking against unspecified object, initial encounter: Secondary | ICD-10-CM | POA: Insufficient documentation

## 2016-07-23 DIAGNOSIS — W19XXXA Unspecified fall, initial encounter: Secondary | ICD-10-CM

## 2016-07-23 DIAGNOSIS — Z79899 Other long term (current) drug therapy: Secondary | ICD-10-CM | POA: Insufficient documentation

## 2016-07-23 DIAGNOSIS — S20211A Contusion of right front wall of thorax, initial encounter: Secondary | ICD-10-CM

## 2016-07-23 HISTORY — DX: Unspecified fracture of unspecified thoracic vertebra, initial encounter for closed fracture: S22.009A

## 2016-07-23 MED ORDER — HYDROMORPHONE HCL 1 MG/ML IJ SOLN
2.0000 mg | Freq: Once | INTRAMUSCULAR | Status: AC
Start: 2016-07-23 — End: 2016-07-23
  Administered 2016-07-23: 2 mg via INTRAMUSCULAR
  Filled 2016-07-23: qty 2

## 2016-07-23 MED ORDER — ONDANSETRON 4 MG PO TBDP
4.0000 mg | ORAL_TABLET | Freq: Once | ORAL | Status: AC
  Administered 2016-07-23: 4 mg via ORAL
  Filled 2016-07-23: qty 1

## 2016-07-23 NOTE — ED Notes (Addendum)
EDPA at [facility], family at [facility]. Pt reports R sided and R rib pain, "worse with movement and deep inspiration".

## 2016-07-23 NOTE — ED Provider Notes (Signed)
[facility] Provider Note   CSN: [id number] Arrival date & time: 07/23/16  2049  By signing my name below, I, Linna DarnerRussell Turner, attest that this documentation has been prepared under the direction and in the presence of Wal-MartJosh Emila Steinhauser, PA-C. Electronically Signed: Linna Darnerussell Turner, Scribe. 07/23/2016. 10:39 PM.  History   Chief Complaint Chief Complaint  Patient presents with  . Fall    The history is provided by the patient. No language interpreter was used.     HPI Comments: [name] Small is a 25 y.o. female with PMHx significant for cardiomyopathy, a-fib, sickle cell anemia, chronic pain on daily percocet, and osteoporosis who presents to the Emergency Department complaining of sudden onset, constant, right-sided chest pain beginning yesterday. Pt reports she tripped and fell over socks yesterday and landed on her back. She notes associated pain to her right ribs, right torso, and middle back. Pt also notes generalized weakness of her bilateral lower extremities since the fall. She endorses chest pain exacerbation with deep inhalation. Pt endorses back pain exacerbation with palpation to her middle back. She notes she is a dialysis patient. She has a pacemaker due to cardiomyopathy and a-fib; she denies recent problems with her defibrillator. It has not fired. She denies feeling lightheaded or dizzy, or having a sensation of palpitations prior to her fall. Pt has Percocet at home due to sickle cell, chronic chest pain, and pain s/p dialysis treatment. Patient denies warning symptoms of back pain including: fecal incontinence, urinary retention or overflow incontinence, night sweats, waking from sleep with back pain, unexplained fevers or weight loss, h/o cancer, IVDU, recent trauma. Denies head or neck injury, LOC, vomiting, vision change.    Past Medical History:  Diagnosis Date  . AICD (automatic cardioverter/defibrillator) present 12/16/14   [employer]  . Anemia   .  Anxiety   . Cardiomyopathy   . Cellulitis and abscess of face 03/22/2013  . CHF (congestive heart failure) (HCC)   . Chronic anticoagulation   . Chronic pain   . Depression   . Dysrhythmia    at times per pt.  . 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 ESRD (end stage renal disease) on dialysis [facility](HCC)    "MWF; [facility]" (04/11/2016)  . GERD (gastroesophageal reflux disease)   . H/O transfusion of packed red blood cells   . Hemodialysis patient (HCC)    Mon. Wed. Fri  . Hyperkalemia 09/2015  . Hypertension   . Narcotic abuse, continuous   . Osteoporosis   . Pelvic fracture (HCC) 06/2015   "on the right"  . Polycystic kidney disease   . Pulmonary emboli (HCC) 01/2012   Bilateral, moderate clot burden, areas of pulmonary infarction and central necrosis  . Renal insufficiency   . Sickle cell anemia [facility](HCC)     Patient Active Problem List   Diagnosis Date Noted  . Essential hypertension 06/17/2016  . Depression with anxiety 06/17/2016  . Volume overload 04/14/2016  . Peripheral edema 04/14/2016  . NICM (nonischemic cardiomyopathy) (HCC) 04/14/2016  . Internal jugular (IJ) vein thromboembolism, acute (HCC) 04/14/2016  . Pericardial effusion 04/14/2016  . Chronic anticoagulation   . Hypertensive urgency 04/11/2016  . DVT (deep venous thrombosis), left 04/11/2016  . Cellulitis of left upper extremity 03/31/2016  . Chronic pain syndrome 02/29/2016  . Chronic combined systolic and diastolic congestive heart failure (HCC) 01/04/2016  . AICD (automatic cardioverter/defibrillator) present  01/04/2016  . 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 Pathologic pelvic fracture 01/04/2016  . Depression 01/04/2016  . Pain in the chest   . ESRD on dialysis (HCC)   . Tachycardia   . Elevated serum hCG   . Chest pain 10/11/2015  . Fluid overload 10/10/2015  . Hypertension  10/10/2015  . Anemia of chronic renal failure, stage 5 (HCC) 10/04/2015  . Thrombocytopenia (HCC) 10/04/2015  . Chest pain of uncertain etiology 10/04/2015  . Chronically Elevated troponin 03/03/2015  . Hyperkalemia 02/21/2015  . Acute on chronic systolic heart failure (HCC) 02/21/2012  . Pulmonary infarct (HCC) 02/07/2012  . ESRD (end stage renal disease) on dialysis (HCC) 02/03/2012  . PE (pulmonary embolism) 02/03/2012    Past Surgical History:  Procedure Laterality Date  . AV FISTULA PLACEMENT Bilateral    "right side stopped working & never had it revised" (04/11/2016)  . CARDIAC CATHETERIZATION    . IMPLANTABLE CARDIOVERTER DEFIBRILLATOR IMPLANT Right 12/2014  . INCISION AND DRAINAGE ABSCESS Right 03/21/2013   Procedure: INCISION AND DRAINAGE RIGHT CHEEK ABSCESS REMOVAL OF FOREIGN BODY;  Surgeon: Serena Colonel, MD;  Location: [facility] OR;  Service: ENT;  Laterality: Right;  . INSERTION OF DIALYSIS CATHETER Right Mar 09, 2016   thigh, Dr. Wyn Quaker, [facility]  . 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 NEPHRECTOMY Right    third kidney placed in 2008, and body rejected in 2012   . PERIPHERAL VASCULAR CATHETERIZATION Left 03/09/2016   Procedure: A/V Shuntogram/Fistulagram;  Surgeon: Annice Needy, MD;  Location: [facility] INVASIVE CV LAB;  Service: Cardiovascular;  Laterality: Left;  . PERIPHERAL VASCULAR CATHETERIZATION N/A 05/09/2016   Procedure: Dialysis/Perma Catheter Removal;  Surgeon: Renford Dills, MD;  Location: [facility] INVASIVE CV LAB;  Service: Cardiovascular;  Laterality: N/A;  . PERIPHERAL VASCULAR CATHETERIZATION N/A 07/12/2016   Procedure: Dialysis/Perma Catheter Insertion;  Surgeon: Annice Needy, MD;  Location: [facility] INVASIVE CV LAB;  Service: Cardiovascular;  Laterality: N/A;  . REVISON OF ARTERIOVENOUS FISTULA Left 03/22/2016   Procedure: REVISON OF ARTERIOVENOUS FISTULA ( ARTEGRAFT );  Surgeon: Annice Needy, MD;  Location: [facility] ORS;  Service: Vascular;  Laterality: Left;  . TONSILLECTOMY AND  ADENOIDECTOMY  ~ 2000    OB History    No data available       Home Medications    Prior to Admission medications   Medication Sig Start Date End Date Taking? Authorizing Provider  apixaban (ELIQUIS) 5 MG TABS tablet Take 1 tablet (5 mg total) by mouth 2 (two) times daily. From 04/09/16 04/09/16   Enedina Finner, MD  carvedilol (COREG) 12.5 MG tablet Take 1 tablet (12.5 mg total) by mouth 2 (two) times daily. 07/04/16   Will Jorja Loa, MD  colchicine 0.6 MG tablet Take 0.5 tablet (0.3 mg total) by mouth two times per week. 07/04/16   Will Jorja Loa, MD  hydrALAZINE (APRESOLINE) 25 MG tablet Take 1 tablet (25 mg total) by mouth 3 (three) times daily. 03/21/16   Will Jorja Loa, MD  isosorbide dinitrate (ISORDIL) 10 MG tablet Take 1 tablet (10 mg total) by mouth 3 (three) times daily. 02/01/16   Olugbemiga Annitta Needs, MD  LORazepam (ATIVAN) 1 MG tablet Take 1 tablet (1 mg total) by mouth daily as needed for anxiety. 07/20/16   Quentin Angst, MD  multivitamin (RENA-VIT) TABS tablet Take 1 tablet by mouth daily. 01/04/16   Quentin Angst, MD  oxyCODONE (OXY IR/ROXICODONE) 5 MG immediate release tablet Take 1 tablet (  5 mg total) by mouth every 6 (six) hours as needed for severe pain. 07/20/16   Quentin Angst, MD  sertraline (ZOLOFT) 100 MG tablet Take 1 tablet (100 mg total) by mouth daily. 03/13/16   Quentin Angst, MD  sevelamer carbonate (RENVELA) 800 MG tablet Take 2-3 tablets (1,600-2,400 mg total) by mouth 3 (three) times daily with meals. 3 tabs with meals, 2 tabs with snacks 01/04/16 01/03/17  Quentin Angst, MD    Family History Family History  Problem Relation Age of Onset  . Polycystic kidney disease Father   . Hypertension Father     Social History Social History  Substance Use Topics  . Smoking status: Former Smoker    Packs/day: 0.00    Years: 1.00    Types: Cigarettes    Quit date: 12/08/2015  . Smokeless tobacco: Never Used  . Alcohol use No       Allergies   Aspirin; Tramadol; Vicodin [hydrocodone-acetaminophen]; Buprenorphine hcl; Iohexol; and Morphine and related   Review of Systems Review of Systems  Constitutional: Negative for fatigue, fever and unexpected weight change.  HENT: Negative for rhinorrhea, sore throat and tinnitus.   Eyes: Negative for photophobia, pain, redness and visual disturbance.  Respiratory: Negative for cough and shortness of breath.   Cardiovascular: Positive for chest pain (right side; right ribs).  Gastrointestinal: Negative for abdominal pain, constipation, diarrhea, nausea and vomiting.       Negative for bowel incontinence.  Genitourinary: Positive for flank pain (right). Negative for dysuria, hematuria, pelvic pain, vaginal bleeding and vaginal discharge.       Negative for bladder incontinence.  Musculoskeletal: Positive for back pain. Negative for gait problem, myalgias and neck pain.  Skin: Negative for rash and wound.  Neurological: Positive for weakness (bilateral lower extremities). Negative for dizziness, light-headedness, numbness and headaches.       Denies saddle paresthesias.  Psychiatric/Behavioral: Negative for confusion and decreased concentration.    Physical Exam Updated Vital Signs BP (!) 123/107 (BP Location: Right Arm)   Pulse 120   Temp 98.1 F (36.7 C) (Oral)   Resp (!) 32   Ht 5\' 2"  (1.575 m)   Wt 126 lb (57.2 kg)   LMP  (LMP Unknown) Comment: [id number]  SpO2 99%   BMI 23.05 kg/m   Physical Exam  Constitutional: She is oriented to person, place, and time. She appears well-developed and well-nourished. No distress.  HENT:  Head: Normocephalic and atraumatic.  Eyes: Conjunctivae and EOM are normal.  Neck: Normal range of motion. Neck supple. No tracheal deviation present.  Cardiovascular: Normal rate.   Pulmonary/Chest: Effort normal. No respiratory distress. She exhibits bony tenderness.    Abdominal: Soft. There is no tenderness. There is no CVA  tenderness.  Musculoskeletal:       Cervical back: She exhibits normal range of motion, no tenderness and no bony tenderness.       Thoracic back: She exhibits tenderness (mid T-spine).       Lumbar back: Normal. She exhibits normal range of motion, no tenderness and no bony tenderness.  No step-off noted with palpation of spine.   Neurological: She is alert and oriented to person, place, and time. She has normal strength and normal reflexes. No sensory deficit.  5/5 strength in entire lower extremities bilaterally. No sensation deficit.   Skin: Skin is warm and dry. No rash noted.  Psychiatric: She has a normal mood and affect. Her behavior is normal.  Nursing note and vitals  reviewed.   ED Treatments / Results   Radiology Dg Ribs Unilateral W/chest Right  Result Date: 07/23/2016 CLINICAL DATA:  Fall at home with right rib pain. Initial encounter. EXAM: RIGHT RIBS AND CHEST - 3+ VIEW COMPARISON:  07/01/2016 FINDINGS: Single chamber pacer/ICD battery pack obscures some of the right ribs. A marker is present more inferiorly and these levels are negative for acute fracture. No hemothorax or pneumothorax on the right. There is infrahilar opacity on the left with small pleural effusion. Chronic marked cardiopericardial enlargement and pulmonary venous congestion. IVC filter. IMPRESSION: 1. Negative for fracture along the symptomatic lower right ribs. No pneumothorax. 2. Left lower lobe atelectasis or pneumonia with small pleural effusion. 3. Chronic marked cardiomegaly and pulmonary venous congestion. Electronically Signed   By: Marnee Spring M.D.   On: 07/23/2016 21:53   Dg Thoracic Spine 2 View  Result Date: 07/24/2016 CLINICAL DATA:  25 y/o  F; fall at home with severe mid back pain. EXAM: THORACIC SPINE 2 VIEWS COMPARISON:  06/17/2016 CT of the chest. FINDINGS: Single lead AICD. Mild S-shaped curvature of thoracic spine. IVC filter. Mild anterior loss of height of a mid thoracic vertebral  body, possibly T8, appears new from prior CT. IMPRESSION: Suspected new mild anterior compression deformity of T8. Confirmation with thoracic CT or MRI is recommended. These results will be called to the ordering clinician or representative by the Radiologist Assistant, and communication documented in the PACS or zVision Dashboard. Electronically Signed   By: Mitzi Hansen M.D.   On: 07/24/2016 00:38    Procedures Procedures (including critical care time)  DIAGNOSTIC STUDIES: Oxygen Saturation is 99% on RA, normal by my interpretation.    COORDINATION OF CARE: 10:48 PM Will order DG Thoracic Spine 2 View and DG Right Ribs Unilateral w/ Chest. Will administer Dilaudid and Zofran. Discussed treatment plan with pt at bedside and pt agreed to plan.  Medications Ordered in ED Medications - No data to display   1:19 AM patient informed of x-ray results. Discussed suspected new compression fracture. She will follow-up with her PCP. Neurosurgery referral given as well. She will continue pain medication at home. Per [facility] substance reporting database patient filled #[phone] oxycodone on 07/21/2016. She should continue this. Discussed rest, heat and other conservative measures.  Patient was counseled on back pain precautions and told to do activity as tolerated but do not lift, push, or pull heavy objects more than 10 pounds for the next week.  Patient counseled to use ice or heat on back for no longer than 15 minutes every hour.   Patient urged to follow-up with PCP if pain does not improve with treatment and rest or if pain becomes recurrent. Urged to return with worsening severe pain, loss of bowel or bladder control, trouble walking.   Take 10 deep breaths every hour while awake. This helps to expand your lungs and prevent infections like pneumonia.   The patient verbalizes understanding and agrees with the plan.  Initial Impression / Assessment and Plan / ED Course  I have reviewed  the triage vital signs and the nursing notes.  Pertinent labs & imaging results that were available during my care of the patient were reviewed by me and considered in my medical decision making (see chart for details).  Clinical Course     I personally performed the services described in this documentation, which was scribed in my presence. The recorded information has been reviewed and is accurate.   Final Clinical  Impressions(s) / ED Diagnoses   Final diagnoses:  Closed compression fracture of thoracic vertebra, initial encounter (HCC)  Rib contusion, right, initial encounter   Patient with mechanical fall unrelated to dizziness, chest pain, or lightheadedness. Do not suspect arrhythmia. Patient has Facilities manager. Patient with back pain which correlates with mild anterior new compression deformity on x-ray. Likely represents new compression injury. Patient is on chronic narcotic pain medication. She will continue this. She does not have any neurological deficits to suggest central cord compression or edema. Also with rib contusion. Cannot rule out occult rib fracture. No pneumothorax. Again, treatment is pain control and follow-up with worsening.   New Prescriptions New Prescriptions   No medications on file     Renne Crigler, PA-C 07/24/16 0122    Rolan Bucco, MD 07/24/16 1500

## 2016-07-23 NOTE — ED Triage Notes (Signed)
Pt reports she tripped and fell yesterday.  Reports right sided pain, worse in the knee.  Also reports right rib pain.

## 2016-07-24 MED ORDER — HYDROMORPHONE HCL 1 MG/ML IJ SOLN
2.0000 mg | Freq: Once | INTRAMUSCULAR | Status: AC
  Administered 2016-07-24: 2 mg via INTRAMUSCULAR
  Filled 2016-07-24: qty 2

## 2016-07-24 NOTE — Discharge Instructions (Signed)
Please read and follow all provided instructions.  Your diagnoses today include:  1. Closed compression fracture of thoracic vertebra, initial encounter (HCC)   2. Fall   3. Rib contusion, right, initial encounter     Tests performed today include:  Vital signs - see below for your results today  X-ray of ribs - no broken ribs  X-ray of back - minor compression fracture  Medications prescribed:   None  Take any prescribed medications only as directed.  Home care instructions:   Follow any educational materials contained in this packet  Please rest, use ice or heat on your back for the next several days  Do not lift, push, pull anything more than 10 pounds for the next week  Follow-up instructions: Please follow-up with your primary care provider in the next 3 days for further evaluation of your symptoms.   Return instructions:  SEEK IMMEDIATE MEDICAL ATTENTION IF YOU HAVE:  New numbness, tingling, weakness, or problem with the use of your arms or legs  Severe back pain not relieved with medications  Loss control of your bowels or bladder  Increasing pain in any areas of the body (such as chest or abdominal pain)  Shortness of breath, dizziness, or fainting.   Worsening nausea (feeling sick to your stomach), vomiting, fever, or sweats  Any other emergent concerns regarding your health   Additional Information:  Your vital signs today were: BP (!) 119/102 (BP Location: Right Arm)    Pulse 103    Temp 98.1 F (36.7 C) (Oral)    Resp 24    Ht 5\' 2"  (1.575 m)    Wt 57.2 kg    LMP  (LMP Unknown) Comment: [email or web address]   SpO2 100%    BMI 23.05 kg/m  If your blood pressure (BP) was elevated above 135/85 this visit, please have this repeated by your doctor within one month. --------------

## 2016-07-25 ENCOUNTER — Telehealth (INDEPENDENT_AMBULATORY_CARE_PROVIDER_SITE_OTHER): Payer: Self-pay

## 2016-07-26 ENCOUNTER — Inpatient Hospital Stay (HOSPITAL_COMMUNITY)
Admission: EM | Admit: 2016-07-26 | Discharge: 2016-08-04 | DRG: 628 | Disposition: A | Discharge status: Home Health | Payer: Medicare Other | Attending: Internal Medicine | Admitting: Internal Medicine

## 2016-07-26 ENCOUNTER — Other Ambulatory Visit (HOSPITAL_COMMUNITY): Payer: Self-pay

## 2016-07-26 ENCOUNTER — Emergency Department (HOSPITAL_COMMUNITY): Payer: Medicare Other

## 2016-07-26 ENCOUNTER — Encounter (HOSPITAL_COMMUNITY): Payer: Self-pay

## 2016-07-26 ENCOUNTER — Other Ambulatory Visit: Payer: Self-pay

## 2016-07-26 DIAGNOSIS — N185 Chronic kidney disease, stage 5: Secondary | ICD-10-CM | POA: Diagnosis not present

## 2016-07-26 DIAGNOSIS — N2581 Secondary hyperparathyroidism of renal origin: Secondary | ICD-10-CM | POA: Diagnosis present

## 2016-07-26 DIAGNOSIS — Z992 Dependence on renal dialysis: Secondary | ICD-10-CM | POA: Diagnosis not present

## 2016-07-26 DIAGNOSIS — W06XXXA Fall from bed, initial encounter: Secondary | ICD-10-CM | POA: Diagnosis present

## 2016-07-26 DIAGNOSIS — Z7901 Long term (current) use of anticoagulants: Secondary | ICD-10-CM | POA: Diagnosis not present

## 2016-07-26 DIAGNOSIS — N186 End stage renal disease: Secondary | ICD-10-CM | POA: Diagnosis present

## 2016-07-26 DIAGNOSIS — Z8271 Family history of polycystic kidney: Secondary | ICD-10-CM

## 2016-07-26 DIAGNOSIS — R0602 Shortness of breath: Secondary | ICD-10-CM | POA: Diagnosis present

## 2016-07-26 DIAGNOSIS — M549 Dorsalgia, unspecified: Secondary | ICD-10-CM | POA: Diagnosis present

## 2016-07-26 DIAGNOSIS — G8929 Other chronic pain: Secondary | ICD-10-CM | POA: Diagnosis present

## 2016-07-26 DIAGNOSIS — E875 Hyperkalemia: Principal | ICD-10-CM | POA: Diagnosis present

## 2016-07-26 DIAGNOSIS — F418 Other specified anxiety disorders: Secondary | ICD-10-CM | POA: Diagnosis present

## 2016-07-26 DIAGNOSIS — Z87891 Personal history of nicotine dependence: Secondary | ICD-10-CM | POA: Diagnosis not present

## 2016-07-26 DIAGNOSIS — M4854XA Collapsed vertebra, not elsewhere classified, thoracic region, initial encounter for fracture: Secondary | ICD-10-CM | POA: Diagnosis present

## 2016-07-26 DIAGNOSIS — T82868A Thrombosis of vascular prosthetic devices, implants and grafts, initial encounter: Secondary | ICD-10-CM | POA: Diagnosis not present

## 2016-07-26 DIAGNOSIS — Z79899 Other long term (current) drug therapy: Secondary | ICD-10-CM

## 2016-07-26 DIAGNOSIS — Z8249 Family history of ischemic heart disease and other diseases of the circulatory system: Secondary | ICD-10-CM

## 2016-07-26 DIAGNOSIS — Z9115 Patient's noncompliance with renal dialysis: Secondary | ICD-10-CM

## 2016-07-26 DIAGNOSIS — T82898A Other specified complication of vascular prosthetic devices, implants and grafts, initial encounter: Secondary | ICD-10-CM | POA: Diagnosis not present

## 2016-07-26 DIAGNOSIS — T8612 Kidney transplant failure: Secondary | ICD-10-CM | POA: Diagnosis present

## 2016-07-26 DIAGNOSIS — I10 Essential (primary) hypertension: Secondary | ICD-10-CM | POA: Diagnosis not present

## 2016-07-26 DIAGNOSIS — M810 Age-related osteoporosis without current pathological fracture: Secondary | ICD-10-CM

## 2016-07-26 DIAGNOSIS — I255 Ischemic cardiomyopathy: Secondary | ICD-10-CM | POA: Diagnosis present

## 2016-07-26 DIAGNOSIS — D631 Anemia in chronic kidney disease: Secondary | ICD-10-CM | POA: Diagnosis present

## 2016-07-26 DIAGNOSIS — Y828 Other medical devices associated with adverse incidents: Secondary | ICD-10-CM | POA: Diagnosis not present

## 2016-07-26 DIAGNOSIS — Z95810 Presence of automatic (implantable) cardiac defibrillator: Secondary | ICD-10-CM | POA: Diagnosis not present

## 2016-07-26 DIAGNOSIS — S22000A Wedge compression fracture of unspecified thoracic vertebra, initial encounter for closed fracture: Secondary | ICD-10-CM

## 2016-07-26 DIAGNOSIS — Z841 Family history of disorders of kidney and ureter: Secondary | ICD-10-CM

## 2016-07-26 DIAGNOSIS — I428 Other cardiomyopathies: Secondary | ICD-10-CM | POA: Diagnosis not present

## 2016-07-26 DIAGNOSIS — I959 Hypotension, unspecified: Secondary | ICD-10-CM | POA: Diagnosis not present

## 2016-07-26 DIAGNOSIS — D571 Sickle-cell disease without crisis: Secondary | ICD-10-CM | POA: Diagnosis present

## 2016-07-26 DIAGNOSIS — D696 Thrombocytopenia, unspecified: Secondary | ICD-10-CM | POA: Diagnosis not present

## 2016-07-26 DIAGNOSIS — J810 Acute pulmonary edema: Secondary | ICD-10-CM | POA: Diagnosis not present

## 2016-07-26 DIAGNOSIS — E8889 Other specified metabolic disorders: Secondary | ICD-10-CM | POA: Diagnosis present

## 2016-07-26 DIAGNOSIS — K219 Gastro-esophageal reflux disease without esophagitis: Secondary | ICD-10-CM | POA: Diagnosis present

## 2016-07-26 DIAGNOSIS — Z86711 Personal history of pulmonary embolism: Secondary | ICD-10-CM

## 2016-07-26 DIAGNOSIS — N250 Renal osteodystrophy: Secondary | ICD-10-CM | POA: Diagnosis present

## 2016-07-26 LAB — CBC WITH DIFFERENTIAL/PLATELET
BASOS PCT: 0 %
Basophils Absolute: 0 10*3/uL (ref 0.0–0.1)
Eosinophils Absolute: 0 10*3/uL (ref 0.0–0.7)
Eosinophils Relative: 0 %
HCT: 35 % — ABNORMAL LOW (ref 36.0–46.0)
HEMOGLOBIN: 11.4 g/dL — AB (ref 12.0–15.0)
LYMPHS ABS: 1 10*3/uL (ref 0.7–4.0)
LYMPHS PCT: 13 %
MCH: 29 pg (ref 26.0–34.0)
MCHC: 32.6 g/dL (ref 30.0–36.0)
MCV: 89.1 fL (ref 78.0–100.0)
MONOS PCT: 8 %
Monocytes Absolute: 0.6 10*3/uL (ref 0.1–1.0)
NEUTROS ABS: 5.8 10*3/uL (ref 1.7–7.7)
NEUTROS PCT: 79 %
Platelets: 219 10*3/uL (ref 150–400)
RBC: 3.93 MIL/uL (ref 3.87–5.11)
RDW: 18.7 % — ABNORMAL HIGH (ref 11.5–15.5)
WBC: 7.4 10*3/uL (ref 4.0–10.5)

## 2016-07-26 LAB — COMPREHENSIVE METABOLIC PANEL
ALBUMIN: 3.5 g/dL (ref 3.5–5.0)
ALK PHOS: 282 U/L — AB (ref 38–126)
ALT: 8 U/L — ABNORMAL LOW (ref 14–54)
ANION GAP: 22 — AB (ref 5–15)
AST: 19 U/L (ref 15–41)
BUN: 148 mg/dL — ABNORMAL HIGH (ref 6–20)
CALCIUM: 9.9 mg/dL (ref 8.9–10.3)
CHLORIDE: 99 mmol/L — AB (ref 101–111)
CO2: 16 mmol/L — AB (ref 22–32)
Creatinine, Ser: 14.96 mg/dL — ABNORMAL HIGH (ref 0.44–1.00)
GFR calc Af Amer: 3 mL/min — ABNORMAL LOW (ref >60)
GFR calc non Af Amer: 3 mL/min — ABNORMAL LOW (ref >60)
GLUCOSE: 96 mg/dL (ref 65–99)
Potassium: 7.3 mmol/L (ref 3.5–5.1)
SODIUM: 137 mmol/L (ref 135–145)
Total Bilirubin: 1.1 mg/dL (ref 0.3–1.2)
Total Protein: 7.9 g/dL (ref 6.5–8.1)

## 2016-07-26 LAB — RENAL FUNCTION PANEL
ANION GAP: 17 — AB (ref 5–15)
Albumin: 3.7 g/dL (ref 3.5–5.0)
BUN: 98 mg/dL — ABNORMAL HIGH (ref 6–20)
CO2: 21 mmol/L — AB (ref 22–32)
Calcium: 9.9 mg/dL (ref 8.9–10.3)
Chloride: 99 mmol/L — ABNORMAL LOW (ref 101–111)
Creatinine, Ser: 10.67 mg/dL — ABNORMAL HIGH (ref 0.44–1.00)
GFR calc non Af Amer: 4 mL/min — ABNORMAL LOW (ref >60)
GFR, EST AFRICAN AMERICAN: 5 mL/min — AB (ref >60)
GLUCOSE: 104 mg/dL — AB (ref 65–99)
POTASSIUM: 4.9 mmol/L (ref 3.5–5.1)
Phosphorus: 8.5 mg/dL — ABNORMAL HIGH (ref 2.5–4.6)
Sodium: 137 mmol/L (ref 135–145)

## 2016-07-26 LAB — I-STAT CHEM 8, ED
BUN: 138 mg/dL — ABNORMAL HIGH (ref 6–20)
CALCIUM ION: 1.04 mmol/L — AB (ref 1.15–1.40)
CHLORIDE: 105 mmol/L (ref 101–111)
Creatinine, Ser: 14.1 mg/dL — ABNORMAL HIGH (ref 0.44–1.00)
GLUCOSE: 96 mg/dL (ref 65–99)
HCT: 40 % (ref 36.0–46.0)
HEMOGLOBIN: 13.6 g/dL (ref 12.0–15.0)
Potassium: 6.9 mmol/L (ref 3.5–5.1)
SODIUM: 135 mmol/L (ref 135–145)
TCO2: 18 mmol/L (ref 0–100)

## 2016-07-26 LAB — MAGNESIUM: MAGNESIUM: 2.4 mg/dL (ref 1.7–2.4)

## 2016-07-26 LAB — BRAIN NATRIURETIC PEPTIDE

## 2016-07-26 MED ORDER — PENTAFLUOROPROP-TETRAFLUOROETH EX AERO: 1.0000 "application " | INHALATION_SPRAY | CUTANEOUS | Status: DC | PRN

## 2016-07-26 MED ORDER — RENA-VITE PO TABS
1.0000 | ORAL_TABLET | Freq: Every day | ORAL | Status: DC
  Administered 2016-07-27 – 2016-08-03 (×8): 1 via ORAL
  Filled 2016-07-26 (×9): qty 1

## 2016-07-26 MED ORDER — CINACALCET HCL 30 MG PO TABS
30.0000 mg | ORAL_TABLET | Freq: Two times a day (BID) | ORAL | Status: DC
  Administered 2016-07-27 – 2016-08-04 (×13): 30 mg via ORAL
  Filled 2016-07-26 (×15): qty 1

## 2016-07-26 MED ORDER — ISOSORBIDE DINITRATE 10 MG PO TABS
10.0000 mg | ORAL_TABLET | Freq: Three times a day (TID) | ORAL | Status: DC
  Administered 2016-07-27 – 2016-08-04 (×18): 10 mg via ORAL
  Filled 2016-07-26 (×22): qty 1

## 2016-07-26 MED ORDER — ALTEPLASE 2 MG IJ SOLR: 2.0000 mg | Freq: Once | INTRAMUSCULAR | Status: DC | PRN

## 2016-07-26 MED ORDER — SODIUM CHLORIDE 0.9 % IV SOLN: 100.0000 mL | INTRAVENOUS | Status: DC | PRN

## 2016-07-26 MED ORDER — HEPARIN SODIUM (PORCINE) 1000 UNIT/ML DIALYSIS: 1000.0000 [IU] | INTRAMUSCULAR | Status: DC | PRN

## 2016-07-26 MED ORDER — HYDROMORPHONE HCL 1 MG/ML IJ SOLN
1.0000 mg | Freq: Once | INTRAMUSCULAR | Status: AC
  Administered 2016-07-26: 1 mg via INTRAVENOUS
  Filled 2016-07-26: qty 1

## 2016-07-26 MED ORDER — LIDOCAINE HCL (PF) 1 % IJ SOLN: 5.0000 mL | INTRAMUSCULAR | Status: DC | PRN

## 2016-07-26 MED ORDER — HYDRALAZINE HCL 25 MG PO TABS
25.0000 mg | ORAL_TABLET | Freq: Three times a day (TID) | ORAL | Status: DC
Start: 2016-07-27 — End: 2016-07-28
  Administered 2016-07-27 (×3): 25 mg via ORAL
  Filled 2016-07-26 (×3): qty 1

## 2016-07-26 MED ORDER — LORAZEPAM 1 MG PO TABS
1.0000 mg | ORAL_TABLET | Freq: Every day | ORAL | Status: DC | PRN
  Administered 2016-07-28 – 2016-07-31 (×3): 1 mg via ORAL
  Filled 2016-07-26 (×3): qty 1

## 2016-07-26 MED ORDER — SEVELAMER CARBONATE 800 MG PO TABS: 1600.0000 mg | ORAL_TABLET | Freq: Three times a day (TID) | ORAL | Status: DC

## 2016-07-26 MED ORDER — ONDANSETRON HCL 4 MG PO TABS
4.0000 mg | ORAL_TABLET | Freq: Four times a day (QID) | ORAL | Status: DC | PRN
  Filled 2016-07-26: qty 1

## 2016-07-26 MED ORDER — ONDANSETRON HCL 4 MG/2ML IJ SOLN
4.0000 mg | Freq: Four times a day (QID) | INTRAMUSCULAR | Status: DC | PRN
  Administered 2016-07-27: 4 mg via INTRAVENOUS

## 2016-07-26 MED ORDER — DEXTROSE 50 % IV SOLN
1.0000 | Freq: Once | INTRAVENOUS | Status: AC
  Administered 2016-07-26: 50 mL via INTRAVENOUS
  Filled 2016-07-26: qty 50

## 2016-07-26 MED ORDER — INSULIN ASPART 100 UNIT/ML IV SOLN
10.0000 [IU] | Freq: Once | INTRAVENOUS | Status: AC
  Administered 2016-07-26: 10 [IU] via INTRAVENOUS
  Filled 2016-07-26: qty 1

## 2016-07-26 MED ORDER — HYDROMORPHONE HCL 1 MG/ML IJ SOLN
INTRAMUSCULAR | Status: AC
  Administered 2016-07-26: 1 mg via INTRAVENOUS
  Filled 2016-07-26: qty 1

## 2016-07-26 MED ORDER — HYDROMORPHONE HCL 1 MG/ML IJ SOLN
1.0000 mg | Freq: Once | INTRAMUSCULAR | Status: AC
Start: 2016-07-26 — End: 2016-07-26
  Administered 2016-07-26: 1 mg via INTRAVENOUS
  Filled 2016-07-26: qty 1

## 2016-07-26 MED ORDER — CALCITRIOL 0.5 MCG PO CAPS
0.5000 ug | ORAL_CAPSULE | ORAL | Status: DC
  Administered 2016-07-28 – 2016-08-04 (×3): 0.5 ug via ORAL
  Filled 2016-07-26 (×3): qty 1

## 2016-07-26 MED ORDER — OXYCODONE HCL 5 MG PO TABS
5.0000 mg | ORAL_TABLET | Freq: Four times a day (QID) | ORAL | Status: DC | PRN
  Administered 2016-07-27: 5 mg via ORAL
  Filled 2016-07-26: qty 1

## 2016-07-26 MED ORDER — CALCIUM GLUCONATE 10 % IV SOLN
1.0000 g | Freq: Once | INTRAVENOUS | Status: AC
  Administered 2016-07-26: 1 g via INTRAVENOUS
  Filled 2016-07-26: qty 10

## 2016-07-26 MED ORDER — SODIUM CHLORIDE 0.9 % IV SOLN
100.0000 mL | INTRAVENOUS | Status: DC | PRN
  Administered 2016-08-01 (×2): via INTRAVENOUS

## 2016-07-26 MED ORDER — HEPARIN SODIUM (PORCINE) 5000 UNIT/ML IJ SOLN
5000.0000 [IU] | Freq: Three times a day (TID) | INTRAMUSCULAR | Status: DC
  Administered 2016-07-28 – 2016-07-31 (×2): 5000 [IU] via SUBCUTANEOUS
  Filled 2016-07-26 (×4): qty 1

## 2016-07-26 MED ORDER — HYDROMORPHONE HCL 1 MG/ML IJ SOLN
1.0000 mg | INTRAMUSCULAR | Status: DC | PRN
  Administered 2016-07-26: 1 mg via INTRAVENOUS

## 2016-07-26 MED ORDER — ACETAMINOPHEN 325 MG PO TABS
650.0000 mg | ORAL_TABLET | Freq: Four times a day (QID) | ORAL | Status: DC | PRN
  Administered 2016-07-27: 650 mg via ORAL
  Filled 2016-07-26: qty 2

## 2016-07-26 MED ORDER — CARVEDILOL 12.5 MG PO TABS
12.5000 mg | ORAL_TABLET | Freq: Two times a day (BID) | ORAL | Status: DC
  Administered 2016-07-27: 12.5 mg via ORAL
  Filled 2016-07-26: qty 1

## 2016-07-26 MED ORDER — LIDOCAINE-PRILOCAINE 2.5-2.5 % EX CREA: 1.0000 "application " | TOPICAL_CREAM | CUTANEOUS | Status: DC | PRN

## 2016-07-26 MED ORDER — ACETAMINOPHEN 650 MG RE SUPP
650.0000 mg | Freq: Four times a day (QID) | RECTAL | Status: DC | PRN
  Filled 2016-07-26: qty 1

## 2016-07-26 MED ORDER — SERTRALINE HCL 100 MG PO TABS
100.0000 mg | ORAL_TABLET | Freq: Every day | ORAL | Status: DC
  Administered 2016-07-27 – 2016-08-04 (×9): 100 mg via ORAL
  Filled 2016-07-26 (×10): qty 1

## 2016-07-26 MED ORDER — HEPARIN SODIUM (PORCINE) 1000 UNIT/ML DIALYSIS: 20.0000 [IU]/kg | INTRAMUSCULAR | Status: DC | PRN

## 2016-07-26 NOTE — Consult Note (Addendum)
Reason for Consult: To manage dialysis and dialysis related needs Referring Physician: [place]  [name] Small is an 25 y.o. female with PMhx significant for cardiomyopathy with ICD, HTN and ESRD- s/p failed tx in 2012, non compliance with dialysis that caused [facility] to get [facility] and since has been getting HD in [place], I think other places but most recently [facility] on [address] followed by the [place] [facility]'s but now the plan is for her to go the new [facility] unit but has not been there yet.  Pt had clotted AVF after she had not been to HD in a week.  So on 9/20 she had a femoral cath placed to get emergent HD with plans to declot at a later time.  She went to OP HD on 9/22 and 9/25 and has not been back since.  She presents here tonight wit SOB- she has also had a recent fall on Saturday with "cracked ribs" and has not been able to walk since ?  labs found K of 6.9 and BUN of 148, CXR with pulm edema.  She is asking for pain medicine   Dialyzes at [facility]- but changing to [facility]  EDW 54. HD Bath 2/2, BFR 400 Dialyzer 180, Heparin yes- 2000 bolus. Access femoral catheter- AVF clotted some time between 9/13 and 9/20. No profile. Calcitriol 0.5 TIW- no ESA- last hgb 10.8, calc 9.6, phos 6.7 and PTH [id number]!  Past Medical History:  Diagnosis Date  . AICD (automatic cardioverter/defibrillator) present 12/16/14   [employer]  . Anemia   . Anxiety   . Cardiomyopathy   . Cellulitis and abscess of face 03/22/2013  . CHF (congestive heart failure) ([place])   . Chronic anticoagulation   . Chronic pain   . Depression   . Dysrhythmia    at times per pt.  . 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 ESRD (end stage renal disease) on dialysis [facility])    "MWF; [place]" (04/11/2016)  . GERD (gastroesophageal reflux disease)   . H/O transfusion of packed  red blood cells   . Hemodialysis patient ([place])    Mon. Wed. Fri  . Hyperkalemia 09/2015  . Hypertension   . Narcotic abuse, continuous   . Osteoporosis   . Pelvic fracture ([place]) 06/2015   "on the right"  . Polycystic kidney disease   . Pulmonary emboli (HCC) 01/2012   Bilateral, moderate clot burden, areas of pulmonary infarction and central necrosis  . Renal insufficiency   . Sickle cell anemia (HCC)     Past Surgical History:  Procedure Laterality Date  . AV FISTULA PLACEMENT Bilateral    "right side stopped working & never had it revised" (04/11/2016)  . CARDIAC CATHETERIZATION    . IMPLANTABLE CARDIOVERTER DEFIBRILLATOR IMPLANT Right 12/2014  . INCISION AND DRAINAGE ABSCESS Right 03/21/2013   Procedure: INCISION AND DRAINAGE RIGHT CHEEK ABSCESS REMOVAL OF FOREIGN BODY;  Surgeon: Izora Gala, MD;  Location: [place];  Service: ENT;  Laterality: Right;  . INSERTION OF DIALYSIS CATHETER Right Mar 09, 2016   thigh, Dr. Lucky Cowboy, [facility]  . 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 NEPHRECTOMY Right    third kidney placed in 2008, and body rejected in 2012   . PERIPHERAL VASCULAR CATHETERIZATION Left 03/09/2016   Procedure: A/V Shuntogram/Fistulagram;  Surgeon: Algernon Huxley, MD;  Location: [place]  CV LAB;  Service: Cardiovascular;  Laterality: Left;  . PERIPHERAL VASCULAR CATHETERIZATION N/A 05/09/2016   Procedure: Dialysis/Perma Catheter Removal;  Surgeon: Katha Cabal, MD;  Location: [place] CV LAB;  Service: Cardiovascular;  Laterality: N/A;  . PERIPHERAL VASCULAR CATHETERIZATION N/A 07/12/2016   Procedure: Dialysis/Perma Catheter Insertion;  Surgeon: Algernon Huxley, MD;  Location: [place] CV LAB;  Service: Cardiovascular;  Laterality: N/A;  . REVISON OF ARTERIOVENOUS FISTULA Left 03/22/2016   Procedure: REVISON OF ARTERIOVENOUS FISTULA ( ARTEGRAFT );  Surgeon: Algernon Huxley, MD;  Location: [facility] ORS;  Service: Vascular;  Laterality: Left;  . TONSILLECTOMY AND ADENOIDECTOMY  ~ 2000     Family History  Problem Relation Age of Onset  . Polycystic kidney disease Father   . Hypertension Father     Social History:  reports that she quit smoking about 7 months ago. Her smoking use included Cigarettes. She smoked 0.00 packs per day for 1.00 year. She has never used smokeless tobacco. She reports that she does not drink alcohol or use drugs.  Allergies:  Allergies  Allergen Reactions  . Aspirin Other (See Comments)    Interacts with Coreg  . Tramadol Anaphylaxis  . Vicodin [Hydrocodone-Acetaminophen] Hives  . Buprenorphine Hcl Itching and Other (See Comments)    Ok with oxycodone  . Iohexol Itching  . Morphine And Related Itching and Other (See Comments)    Ok with oxycodone    Medications: I have reviewed the patient's current medications.   Results for orders placed or performed during the hospital encounter of 07/26/16 (from the past 48 hour(s))  CBC with Differential     Status: Abnormal   Collection Time: 07/26/16  6:03 PM  Result Value Ref Range   WBC 7.4 4.0 - 10.5 K/uL   RBC 3.93 3.87 - 5.11 MIL/uL   Hemoglobin 11.4 (L) 12.0 - 15.0 g/dL   HCT 35.0 (L) 36.0 - 46.0 %   MCV 89.1 78.0 - 100.0 fL   MCH 29.0 26.0 - 34.0 pg   MCHC 32.6 30.0 - 36.0 g/dL   RDW 18.7 (H) 11.5 - 15.5 %   Platelets 219 150 - 400 K/uL   Neutrophils Relative % 79 %   Neutro Abs 5.8 1.7 - 7.7 K/uL   Lymphocytes Relative 13 %   Lymphs Abs 1.0 0.7 - 4.0 K/uL   Monocytes Relative 8 %   Monocytes Absolute 0.6 0.1 - 1.0 K/uL   Eosinophils Relative 0 %   Eosinophils Absolute 0.0 0.0 - 0.7 K/uL   Basophils Relative 0 %   Basophils Absolute 0.0 0.0 - 0.1 K/uL  Comprehensive metabolic panel     Status: Abnormal   Collection Time: 07/26/16  6:03 PM  Result Value Ref Range   Sodium 137 135 - 145 mmol/L   Potassium 7.3 (HH) 3.5 - 5.1 mmol/L    Comment: CRITICAL RESULT CALLED TO, READ BACK BY AND VERIFIED WITH: HOUSAND,R RN 07/26/2016 1906 JORDANS NO VISIBLE HEMOLYSIS    Chloride  99 (L) 101 - 111 mmol/L   CO2 16 (L) 22 - 32 mmol/L   Glucose, Bld 96 65 - 99 mg/dL   BUN 148 (H) 6 - 20 mg/dL   Creatinine, Ser 14.96 (H) 0.44 - 1.00 mg/dL   Calcium 9.9 8.9 - 10.3 mg/dL   Total Protein 7.9 6.5 - 8.1 g/dL   Albumin 3.5 3.5 - 5.0 g/dL   AST 19 15 - 41 U/L   ALT 8 (L) 14 - 54  U/L   Alkaline Phosphatase 282 (H) 38 - 126 U/L   Total Bilirubin 1.1 0.3 - 1.2 mg/dL   GFR calc non Af Amer 3 (L) >60 mL/min   GFR calc Af Amer 3 (L) >60 mL/min    Comment: (NOTE) The eGFR has been calculated using the CKD EPI equation. This calculation has not been validated in all clinical situations. eGFR's persistently <60 mL/min signify possible Chronic Kidney Disease.    Anion gap 22 (H) 5 - 15    Comment: RESULT CHECKED  Magnesium     Status: None   Collection Time: 07/26/16  6:03 PM  Result Value Ref Range   Magnesium 2.4 1.7 - 2.4 mg/dL  Brain natriuretic peptide     Status: Abnormal   Collection Time: 07/26/16  6:03 PM  Result Value Ref Range   B Natriuretic Peptide >4,500.0 (H) 0.0 - 100.0 pg/mL  I-Stat Chem 8, ED     Status: Abnormal   Collection Time: 07/26/16  6:14 PM  Result Value Ref Range   Sodium 135 135 - 145 mmol/L   Potassium 6.9 (HH) 3.5 - 5.1 mmol/L   Chloride 105 101 - 111 mmol/L   BUN 138 (H) 6 - 20 mg/dL   Creatinine, Ser 14.10 (H) 0.44 - 1.00 mg/dL   Glucose, Bld 96 65 - 99 mg/dL   Calcium, Ion 1.04 (L) 1.15 - 1.40 mmol/L   TCO2 18 0 - 100 mmol/L   Hemoglobin 13.6 12.0 - 15.0 g/dL   HCT 40.0 36.0 - 46.0 %   Comment NOTIFIED PHYSICIAN     Dg Chest 2 View  Result Date: 07/26/2016 CLINICAL DATA:  Shortness of breath and chest pain with history of recent rib fractures EXAM: CHEST  2 VIEW COMPARISON:  07/23/2016 FINDINGS: Cardiac shadow remains enlarged. A defibrillator is again seen. The lungs are well aerated bilaterally. Previously seen left basilar changes have improved in the interval from the prior exam. No sizable effusion is seen. No acute bony  abnormality is noted. IMPRESSION: Improved aeration in the left lung base when compare with the prior exam. No new focal abnormality is seen. Electronically Signed   By: Inez Catalina M.D.   On: 07/26/2016 18:53    ROS: SOB Blood pressure (!) 135/113, pulse 88, temperature 98 F (36.7 C), temperature source Oral, resp. rate 26, height '5\' 2"'  (1.575 m), weight 52 kg (114 lb 10.2 oz), SpO2 (!) 83 %. General appearance: alert and appears older than stated age Neck: JVD - 3 cm above sternal notch, no adenopathy, no carotid bruit, supple, symmetrical, trachea midline and thyroid not enlarged, symmetric, no tenderness/mass/nodules Resp: diminished breath sounds bilaterally Cardio: regular rate and rhythm and S3 present GI: soft, non-tender; bowel sounds normal; no masses,  no organomegaly Extremities: edema 1+ femoral PC- clotted AVF periorbital edema   Assessment/Plan: 25 year old BF with cardiomyopathy- ESRD and noncompliance presenting with uremia and hyperkalemia and difficult access situation 1 Hyperkalemia- due to missed HD and possible catheter malfunction- emergent HD tonight and tomorrow as well 2 ESRD: noncompliant- BUT we are hoping that she is noncompliant because she is having to travel to HD 40 min to [place].  Has been accepted at [facility]- need to clarify this and get it done so she knows where she will go after hosp.  Because she is so uremic, has access issues (will see if IR thinks they can declot her access ?tomorrow or Friday) and need to sort out her permanent OP  HD situation- I think it would be best to admit her for a couple of days to get all this done as well as serial HD 3 Hypertension: due to volume- UF as able  4. Anemia of ESRD: istat hgb is over 13- was not on ESA as OP 5. Metabolic Bone Disease: highest PTH I have seen in a while- continue calcitriol- also give sensipar and binders here.  Probably needs a PTH ectomy 6. S/p fall with fractures and inability to walk since ?   Per primary team- does she need intervention ? PT ?   Jatziri Goffredo A 07/26/2016, 7:49 PM

## 2016-07-26 NOTE — Telephone Encounter (Signed)
Spoke with patient's father yesterday and let him know that because the patient fell and cracked her ribs, Dr. Gilda Crease would prefer that she get well before attempting to declot her access. she does have a permcath to dialyize with. Father understood.    By Dominga Ferry, CMA

## 2016-07-26 NOTE — ED Triage Notes (Signed)
[facility]- pt coming from home with shortness of breath. Pt is MWF dialysis pt, missed 2 sessions. She reports a fall on Sunday, seen at [facility] and dx with rib fracture.

## 2016-07-26 NOTE — H&P (Signed)
History and Physical    Charl Wellen VHQ:[id number] DOB: 11/24/1990 DOA: 07/26/2016  PCP: Jeanann Lewandowsky, MD  Patient coming from: Home.  Chief Complaint: Back pain and shortness of breath.  HPI: [name] Small is a 25 y.o. female with ESRD on hemodialysis noncompliant with dialysis, cardiomyopathy status post AICD placement as had recent stress test done in September 26 which showed EF of 12% with reversible ischemia, hypertension presents to the ER because of increasing upper back pain after a recent fall and shortness of breath. Labs show hyperkalemia. Patient states after a fall she was not able to walk due to pain and had not gone to her dialysis for at least 2 sessions. EKG shows some prominent T waves. On-call nephrologist has been consulted and patient is being taken for urgent dialysis. On exam patient complains of upper mid back pain and had come to the ER 2 days ago after a fall and x-rays at that time showed T8 compression fracture. Patient denies any incontinence of urine or bowels. Is able to move all extremities.   ED Course: Patient is being taken for urgent dialysis.  Review of Systems: As per HPI, rest all negative.   Past Medical History:  Diagnosis Date  . 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 Anemia   . Anxiety   . Cardiomyopathy   . Cellulitis and abscess of face 03/22/2013  . CHF (congestive heart failure) (HCC)   . Chronic anticoagulation   . Chronic pain   . Depression   . Dysrhythmia    at times per pt.  . 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 ESRD (end stage renal disease) on dialysis [facility])    "MWF; [facility]" (04/11/2016)  . GERD (gastroesophageal reflux disease)   . H/O transfusion of packed red blood cells   . Hemodialysis patient (HCC)    Mon. Wed. Fri  . Hyperkalemia 09/2015  .  Hypertension   . Narcotic abuse, continuous   . Osteoporosis   . Pelvic fracture (HCC) 06/2015   "on the right"  . Polycystic kidney disease   . Pulmonary emboli (HCC) 01/2012   Bilateral, moderate clot burden, areas of pulmonary infarction and central necrosis  . Renal insufficiency   . Sickle cell anemia (HCC)     Past Surgical History:  Procedure Laterality Date  . AV FISTULA PLACEMENT Bilateral    "right side stopped working & never had it revised" (04/11/2016)  . CARDIAC CATHETERIZATION    . IMPLANTABLE CARDIOVERTER DEFIBRILLATOR IMPLANT Right 12/2014  . INCISION AND DRAINAGE ABSCESS Right 03/21/2013   Procedure: INCISION AND DRAINAGE RIGHT CHEEK ABSCESS REMOVAL OF FOREIGN BODY;  Surgeon: Serena Colonel, MD;  Location: [facility] OR;  Service: ENT;  Laterality: Right;  . INSERTION OF DIALYSIS CATHETER Right Mar 09, 2016   thigh, Dr. Wyn Quaker, [facility]  . 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 NEPHRECTOMY Right    third kidney placed in 2008, and body rejected in 2012   . PERIPHERAL VASCULAR CATHETERIZATION Left 03/09/2016   Procedure: A/V Shuntogram/Fistulagram;  Surgeon: Annice Needy, MD;  Location: [facility] INVASIVE CV LAB;  Service: Cardiovascular;  Laterality: Left;  . PERIPHERAL VASCULAR CATHETERIZATION N/A 05/09/2016   Procedure: Dialysis/Perma Catheter Removal;  Surgeon: Renford Dills, MD;  Location: [facility] INVASIVE CV LAB;  Service: Cardiovascular;  Laterality: N/A;  . PERIPHERAL VASCULAR CATHETERIZATION N/A 07/12/2016  Procedure: Dialysis/Perma Catheter Insertion;  Surgeon: Annice NeedyJason S Dew, MD;  Location: [facility] INVASIVE CV LAB;  Service: Cardiovascular;  Laterality: N/A;  . REVISON OF ARTERIOVENOUS FISTULA Left 03/22/2016   Procedure: REVISON OF ARTERIOVENOUS FISTULA ( ARTEGRAFT );  Surgeon: Annice NeedyJason S Dew, MD;  Location: [facility] ORS;  Service: Vascular;  Laterality: Left;  . TONSILLECTOMY AND ADENOIDECTOMY  ~ 2000     reports that she quit smoking about 7 months ago. Her smoking use included Cigarettes. She  smoked 0.00 packs per day for 1.00 year. She has never used smokeless tobacco. She reports that she does not drink alcohol or use drugs.  Allergies  Allergen Reactions  . Aspirin Other (See Comments)    Interacts with Coreg  . Tramadol Anaphylaxis  . Vicodin [Hydrocodone-Acetaminophen] Hives  . Buprenorphine Hcl Itching and Other (See Comments)    Ok with oxycodone  . Iohexol Itching  . Morphine And Related Itching and Other (See Comments)    Ok with oxycodone    Family History  Problem Relation Age of Onset  . Polycystic kidney disease Father   . Hypertension Father     Prior to Admission medications   Medication Sig Start Date End Date Taking? Authorizing Provider  apixaban (ELIQUIS) 5 MG TABS tablet Take 1 tablet (5 mg total) by mouth 2 (two) times daily. From 04/09/16 04/09/16   Enedina FinnerSona Patel, MD  carvedilol (COREG) 12.5 MG tablet Take 1 tablet (12.5 mg total) by mouth 2 (two) times daily. 07/04/16   Will Jorja LoaMartin Camnitz, MD  colchicine 0.6 MG tablet Take 0.5 tablet (0.3 mg total) by mouth two times per week. 07/04/16   Will Jorja LoaMartin Camnitz, MD  hydrALAZINE (APRESOLINE) 25 MG tablet Take 1 tablet (25 mg total) by mouth 3 (three) times daily. 03/21/16   Will Jorja LoaMartin Camnitz, MD  isosorbide dinitrate (ISORDIL) 10 MG tablet Take 1 tablet (10 mg total) by mouth 3 (three) times daily. 02/01/16   Olugbemiga Annitta NeedsE Jegede, MD  LORazepam (ATIVAN) 1 MG tablet Take 1 tablet (1 mg total) by mouth daily as needed for anxiety. 07/20/16   Quentin Angstlugbemiga E Jegede, MD  multivitamin (RENA-VIT) TABS tablet Take 1 tablet by mouth daily. 01/04/16   Quentin Angstlugbemiga E Jegede, MD  oxyCODONE (OXY IR/ROXICODONE) 5 MG immediate release tablet Take 1 tablet (5 mg total) by mouth every 6 (six) hours as needed for severe pain. 07/20/16   Quentin Angstlugbemiga E Jegede, MD  sertraline (ZOLOFT) 100 MG tablet Take 1 tablet (100 mg total) by mouth daily. 03/13/16   Quentin Angstlugbemiga E Jegede, MD  sevelamer carbonate (RENVELA) 800 MG tablet Take 2-3 tablets  (1,600-2,400 mg total) by mouth 3 (three) times daily with meals. 3 tabs with meals, 2 tabs with snacks 01/04/16 01/03/17  Quentin Angstlugbemiga E Jegede, MD    Physical Exam: Vitals:   07/26/16 1945 07/26/16 2009 07/26/16 2021 07/26/16 2030  BP: 114/76 137/89 (!) 123/101 (!) 120/94  Pulse: 100 97 99 99  Resp: 17 (!) 25    Temp:  98 F (36.7 C)    TempSrc:      SpO2: 97%     Weight:      Height:          Constitutional: Moderately built and nourished. Vitals:   07/26/16 1945 07/26/16 2009 07/26/16 2021 07/26/16 2030  BP: 114/76 137/89 (!) 123/101 (!) 120/94  Pulse: 100 97 99 99  Resp: 17 (!) 25    Temp:  98 F (36.7 C)    TempSrc:  SpO2: 97%     Weight:      Height:       Eyes: Anicteric mild pallor. ENMT: No discharge from the ears eyes nose or mouth. Neck: Elevated JVD no mass felt. Respiratory: No rhonchi or crepitations. Cardiovascular: S1-S2 heard. Abdomen: Soft nontender bowel sounds present. Musculoskeletal: No edema. No joint effusion. Skin: No rash. Skin appears warm. Neurologic: Moves all extremities 5 x 5. Psychiatric: No facial asymmetry. Tongue is midline.   Labs on Admission: I have personally reviewed following labs and imaging studies  CBC:  Recent Labs Lab 07/26/16 1803 07/26/16 1814  WBC 7.4  --   NEUTROABS 5.8  --   HGB 11.4* 13.6  HCT 35.0* 40.0  MCV 89.1  --   PLT 219  --    Basic Metabolic Panel:  Recent Labs Lab 07/26/16 1803 07/26/16 1814  NA 137 135  K 7.3* 6.9*  CL 99* 105  CO2 16*  --   GLUCOSE 96 96  BUN 148* 138*  CREATININE 14.96* 14.10*  CALCIUM 9.9  --   MG 2.4  --    GFR: Estimated Creatinine Clearance: 4.8 mL/min (by C-G formula based on SCr of 14.1 mg/dL (H)). Liver Function Tests:  Recent Labs Lab 07/26/16 1803  AST 19  ALT 8*  ALKPHOS 282*  BILITOT 1.1  PROT 7.9  ALBUMIN 3.5   No results for input(s): LIPASE, AMYLASE in the last 168 hours. No results for input(s): AMMONIA in the last 168  hours. Coagulation Profile: No results for input(s): INR, PROTIME in the last 168 hours. Cardiac Enzymes: No results for input(s): CKTOTAL, CKMB, CKMBINDEX, TROPONINI in the last 168 hours. BNP (last 3 results) No results for input(s): PROBNP in the last 8760 hours. HbA1C: No results for input(s): HGBA1C in the last 72 hours. CBG: No results for input(s): GLUCAP in the last 168 hours. Lipid Profile: No results for input(s): CHOL, HDL, LDLCALC, TRIG, CHOLHDL, LDLDIRECT in the last 72 hours. Thyroid Function Tests: No results for input(s): TSH, T4TOTAL, FREET4, T3FREE, THYROIDAB in the last 72 hours. Anemia Panel: No results for input(s): VITAMINB12, FOLATE, FERRITIN, TIBC, IRON, RETICCTPCT in the last 72 hours. Urine analysis:    Component Value Date/Time   COLORURINE YELLOW 01/14/2013 1639   APPEARANCEUR CLOUDY (A) 01/14/2013 1639   LABSPEC 1.008 01/14/2013 1639   PHURINE 8.0 01/14/2013 1639   GLUCOSEU NEGATIVE 01/14/2013 1639   HGBUR SMALL (A) 01/14/2013 1639   BILIRUBINUR NEGATIVE 01/14/2013 1639   KETONESUR NEGATIVE 01/14/2013 1639   PROTEINUR 30 (A) 01/14/2013 1639   UROBILINOGEN 0.2 01/14/2013 1639   NITRITE NEGATIVE 01/14/2013 1639   LEUKOCYTESUR MODERATE (A) 01/14/2013 1639   Sepsis Labs: @LABRCNTIP (procalcitonin:4,lacticidven:4) )No results found for this or any previous visit (from the past 240 hour(s)).   Radiological Exams on Admission: Dg Chest 2 View  Result Date: 07/26/2016 CLINICAL DATA:  Shortness of breath and chest pain with history of recent rib fractures EXAM: CHEST  2 VIEW COMPARISON:  07/23/2016 FINDINGS: Cardiac shadow remains enlarged. A defibrillator is again seen. The lungs are well aerated bilaterally. Previously seen left basilar changes have improved in the interval from the prior exam. No sizable effusion is seen. No acute bony abnormality is noted. IMPRESSION: Improved aeration in the left lung base when compare with the prior exam. No new focal  abnormality is seen. Electronically Signed   By: Alcide Clever M.D.   On: 07/26/2016 18:53    EKG: Independently reviewed. Normal sinus rhythm with  prominent T waves.  Assessment/Plan Principal Problem:   Acute pulmonary edema (HCC) Active Problems:   Hyperkalemia   ESRD on dialysis (HCC)   NICM (nonischemic cardiomyopathy) (HCC)   Essential hypertension   Back pain    1. Hyperkalemia and pulmonary edema - secondary to missing dialysis. Appreciate nephrology consult patient is being taken for urgent dialysis. Follow metabolic panel intake output and further recommendations per nephrology. 2. Fall with upper mid back pain and x-ray showing T8 compression fracture - since patient has AICD cannot get MRI. I have ordered CT T-spine. Further recommendation based on CT findings. 3. Hypertension on Coreg and Isordil. Volume control per nephrology. 4. Chronic anemia secondary to ESRD - follow CBC. 5. Cardiomyopathy status post AICD placement.   DVT prophylaxis: Heparin. Code Status: Full code.  Family Communication: Discussed with patient.  Disposition Plan: Home.  Consults called: Nephrology.  Admission status: Inpatient. Likely stay 2 days.    Eduard Clos MD [facility] Pager [phone].  If 7PM-7AM, please contact night-coverage [email or web address] Password [email or web address]  07/26/2016, 8:51 PM

## 2016-07-26 NOTE — Procedures (Signed)
Patient was seen on dialysis and the procedure was supervised.  BFR just getting started   Via femoral PC BP is  114/76 .   Patient appears to be tolerating treatment well  Sheela Mcculley A 07/26/2016

## 2016-07-26 NOTE — Progress Notes (Signed)
Patient arrived to unit by ED stretcher.  Reviewed treatment plan and this RN agrees with plan.  Report received from bedside RN, Nehemiah Settle.  Consent obtained.  Patient A & O X 4.   Lung sounds diminished to ausculation in all fields. Generalized moderate, non pitting edema. Cardiac:  NSR to ST.  Removed caps and cleansed L thigh catheter with chlorhedxidine.  Aspirated ports of heparin and flushed them with saline per protocol.  Connected and secured lines, initiated treatment at 2021.  UF Goal of and net fluid removal 4L.  Will continue to monitor.

## 2016-07-26 NOTE — Progress Notes (Signed)
istat potassium result of less than 2.0 obtained.  Nephrology notified, acid bath changed.  Will continue to monitor.

## 2016-07-26 NOTE — ED Notes (Signed)
Pt back from scans and placed back on monitor at this time.

## 2016-07-26 NOTE — ED Provider Notes (Signed)
[facility]Y DEPT Provider Note   CSN: [id number] Arrival date & time: 07/26/16  1743   History   Chief Complaint Chief Complaint  Patient presents with  . Shortness of Breath    HPI [name] Small is a 25 y.o. female.  The history is provided by the patient.  Shortness of Breath  This is a new problem. The average episode lasts 2 days. The problem occurs continuously.The current episode started more than 2 days ago. The problem has been gradually worsening. Associated symptoms include PND, orthopnea and leg swelling. Pertinent negatives include no fever, no headaches, no cough, no sputum production, no chest pain, no vomiting, no abdominal pain, no rash and no leg pain. Precipitated by: missed dialysis x past 2 days, no HD in 5 days. She has tried nothing for the symptoms. She has had prior hospitalizations. She has had prior ED visits. Associated medical issues include heart failure.    Past Medical History:  Diagnosis Date  . 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 Anemia   . Anxiety   . Cardiomyopathy   . Cellulitis and abscess of face 03/22/2013  . CHF (congestive heart failure) (HCC)   . Chronic anticoagulation   . Chronic pain   . Depression   . Dysrhythmia    at times per pt.  . 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 ESRD (end stage renal disease) on dialysis [facility])    "MWF; [facility]" (04/11/2016)  . GERD (gastroesophageal reflux disease)   . H/O transfusion of packed red blood cells   . Hemodialysis patient (HCC)    Mon. Wed. Fri  . Hyperkalemia 09/2015  . Hypertension   . Narcotic abuse, continuous   . Osteoporosis   . Pelvic fracture (HCC) 06/2015   "on the right"  . Polycystic kidney disease   . Pulmonary emboli (HCC) 01/2012   Bilateral, moderate clot burden, areas of pulmonary infarction and  central necrosis  . Renal insufficiency   . Sickle cell anemia [facility])     Patient Active Problem List   Diagnosis Date Noted  . Acute pulmonary edema (HCC) 07/26/2016  . Back pain 07/26/2016  . Essential hypertension 06/17/2016  . Depression with anxiety 06/17/2016  . Volume overload 04/14/2016  . Peripheral edema 04/14/2016  . NICM (nonischemic cardiomyopathy) (HCC) 04/14/2016  . Internal jugular (IJ) vein thromboembolism, acute (HCC) 04/14/2016  . Pericardial effusion 04/14/2016  . Chronic anticoagulation   . Hypertensive urgency 04/11/2016  . DVT (deep venous thrombosis), left 04/11/2016  . Cellulitis of left upper extremity 03/31/2016  . Chronic pain syndrome 02/29/2016  . Chronic combined systolic and diastolic congestive heart failure (HCC) 01/04/2016  . AICD (automatic cardioverter/defibrillator) present 01/04/2016  . 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 Pathologic pelvic fracture 01/04/2016  . Depression 01/04/2016  . Pain in the chest   . ESRD on dialysis (HCC)   . Tachycardia   . Elevated serum hCG   . Chest pain 10/11/2015  . Fluid overload 10/10/2015  . Hypertension 10/10/2015  . Anemia of chronic renal failure, stage 5 (HCC) 10/04/2015  . Thrombocytopenia (HCC) 10/04/2015  . Chest pain of uncertain etiology 10/04/2015  . Chronically Elevated troponin 03/03/2015  . Hyperkalemia 02/21/2015  . Acute on chronic systolic heart failure (HCC) 02/21/2012  . Pulmonary infarct (HCC) 02/07/2012  . ESRD (end stage renal disease) on dialysis (HCC) 02/03/2012  .  PE (pulmonary embolism) 02/03/2012    Past Surgical History:  Procedure Laterality Date  . AV FISTULA PLACEMENT Bilateral    "right side stopped working & never had it revised" (04/11/2016)  . CARDIAC CATHETERIZATION    . IMPLANTABLE CARDIOVERTER DEFIBRILLATOR IMPLANT Right 12/2014  . INCISION AND DRAINAGE ABSCESS Right 03/21/2013   Procedure: INCISION AND DRAINAGE RIGHT CHEEK ABSCESS REMOVAL OF FOREIGN BODY;   Surgeon: Serena Colonel, MD;  Location: [facility];  Service: ENT;  Laterality: Right;  . INSERTION OF DIALYSIS CATHETER Right Mar 09, 2016   thigh, Dr. Wyn Quaker, [facility]  . 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 NEPHRECTOMY Right    third kidney placed in 2008, and body rejected in 2012   . PERIPHERAL VASCULAR CATHETERIZATION Left 03/09/2016   Procedure: A/V Shuntogram/Fistulagram;  Surgeon: Annice Needy, MD;  Location: [facility] INVASIVE CV LAB;  Service: Cardiovascular;  Laterality: Left;  . PERIPHERAL VASCULAR CATHETERIZATION N/A 05/09/2016   Procedure: Dialysis/Perma Catheter Removal;  Surgeon: Renford Dills, MD;  Location: [facility] INVASIVE CV LAB;  Service: Cardiovascular;  Laterality: N/A;  . PERIPHERAL VASCULAR CATHETERIZATION N/A 07/12/2016   Procedure: Dialysis/Perma Catheter Insertion;  Surgeon: Annice Needy, MD;  Location: [facility] INVASIVE CV LAB;  Service: Cardiovascular;  Laterality: N/A;  . REVISON OF ARTERIOVENOUS FISTULA Left 03/22/2016   Procedure: REVISON OF ARTERIOVENOUS FISTULA ( ARTEGRAFT );  Surgeon: Annice Needy, MD;  Location: [facility] ORS;  Service: Vascular;  Laterality: Left;  . TONSILLECTOMY AND ADENOIDECTOMY  ~ 2000    OB History    No data available       Home Medications    Prior to Admission medications   Medication Sig Start Date End Date Taking? Authorizing Provider  apixaban (ELIQUIS) 5 MG TABS tablet Take 1 tablet (5 mg total) by mouth 2 (two) times daily. From 04/09/16 04/09/16   Enedina Finner, MD  carvedilol (COREG) 12.5 MG tablet Take 1 tablet (12.5 mg total) by mouth 2 (two) times daily. 07/04/16   Will Jorja Loa, MD  colchicine 0.6 MG tablet Take 0.5 tablet (0.3 mg total) by mouth two times per week. 07/04/16   Will Jorja Loa, MD  hydrALAZINE (APRESOLINE) 25 MG tablet Take 1 tablet (25 mg total) by mouth 3 (three) times daily. 03/21/16   Will Jorja Loa, MD  isosorbide dinitrate (ISORDIL) 10 MG tablet Take 1 tablet (10 mg total) by mouth 3 (three) times daily. 02/01/16    Olugbemiga Annitta Needs, MD  LORazepam (ATIVAN) 1 MG tablet Take 1 tablet (1 mg total) by mouth daily as needed for anxiety. 07/20/16   Quentin Angst, MD  multivitamin (RENA-VIT) TABS tablet Take 1 tablet by mouth daily. 01/04/16   Quentin Angst, MD  oxyCODONE (OXY IR/ROXICODONE) 5 MG immediate release tablet Take 1 tablet (5 mg total) by mouth every 6 (six) hours as needed for severe pain. 07/20/16   Quentin Angst, MD  sertraline (ZOLOFT) 100 MG tablet Take 1 tablet (100 mg total) by mouth daily. 03/13/16   Quentin Angst, MD  sevelamer carbonate (RENVELA) 800 MG tablet Take 2-3 tablets (1,600-2,400 mg total) by mouth 3 (three) times daily with meals. 3 tabs with meals, 2 tabs with snacks 01/04/16 01/03/17  Quentin Angst, MD    Family History Family History  Problem Relation Age of Onset  . Polycystic kidney disease Father   . Hypertension Father     Social History Social History  Substance Use Topics  . Smoking  status: Former Smoker    Packs/day: 0.00    Years: 1.00    Types: Cigarettes    Quit date: 12/08/2015  . Smokeless tobacco: Never Used  . Alcohol use No     Allergies   Aspirin; Tramadol; Vicodin [hydrocodone-acetaminophen]; Buprenorphine hcl; Iohexol; and Morphine and related   Review of Systems Review of Systems  Constitutional: Negative for fever.  HENT: Negative for congestion.   Respiratory: Positive for shortness of breath. Negative for cough, sputum production and chest tightness.   Cardiovascular: Positive for orthopnea, leg swelling and PND. Negative for chest pain.  Gastrointestinal: Negative for abdominal pain, nausea and vomiting.  Genitourinary: Negative for flank pain.  Musculoskeletal: Negative for myalgias.       Right shoulder discomfort and right lateral chest wall pain since fall 4 days ago  Skin: Negative for rash.  Neurological: Negative for headaches.  Psychiatric/Behavioral: Negative for confusion.     Physical  Exam Updated Vital Signs BP (!) 117/91   Pulse 99   Temp 98 F (36.7 C)   Resp (!) 25   Ht 5\' 2"  (1.575 m)   Wt 52 kg   LMP  (LMP Unknown) Comment: [id number]  SpO2 97%   BMI 20.97 kg/m   Physical Exam  Constitutional: She is oriented to person, place, and time. She appears well-developed and well-nourished. No distress.  Pleasant, cooperative, appears dyspneic  HENT:  Head: Normocephalic and atraumatic.  Eyes: No scleral icterus.  Pale conjunctiva  Neck: Normal range of motion. Neck supple. JVD present. No tracheal deviation present.  Cardiovascular: Normal rate, regular rhythm and intact distal pulses.  Exam reveals gallop.   Murmur heard. Pulmonary/Chest: She has wheezes. She has rales.  Shallow frequent breaths, no accessory muscle use. No respiratory distress  Abdominal: Soft. She exhibits distension. There is no tenderness.  Mild abdominal distension  Musculoskeletal: She exhibits edema. She exhibits no tenderness.  3+ pitting edema from ankles to knees b/l. No calf tenderness. Symmetric size and appearance of b/l LE's  Neurological: She is alert and oriented to person, place, and time. She exhibits normal muscle tone. Coordination normal.  Skin: Skin is warm and dry. Capillary refill takes less than 2 seconds. No rash noted. She is not diaphoretic.  Psychiatric: She has a normal mood and affect.  Nursing note and vitals reviewed.    ED Treatments / Results  Labs (all labs ordered are listed, but only abnormal results are displayed) Labs Reviewed  CBC WITH DIFFERENTIAL/PLATELET - Abnormal; Notable for the following:       Result Value   Hemoglobin 11.4 (*)    HCT 35.0 (*)    RDW 18.7 (*)    All other components within normal limits  COMPREHENSIVE METABOLIC PANEL - Abnormal; Notable for the following:    Potassium 7.3 (*)    Chloride 99 (*)    CO2 16 (*)    BUN 148 (*)    Creatinine, Ser 14.96 (*)    ALT 8 (*)    Alkaline Phosphatase 282 (*)    GFR calc non Af  Amer 3 (*)    GFR calc Af Amer 3 (*)    Anion gap 22 (*)    All other components within normal limits  BRAIN NATRIURETIC PEPTIDE - Abnormal; Notable for the following:    B Natriuretic Peptide >4,500.0 (*)    All other components within normal limits  RENAL FUNCTION PANEL - Abnormal; Notable for the following:    Chloride 99 (*)  CO2 21 (*)    Glucose, Bld 104 (*)    BUN 98 (*)    Creatinine, Ser 10.67 (*)    Phosphorus 8.5 (*)    GFR calc non Af Amer 4 (*)    GFR calc Af Amer 5 (*)    Anion gap 17 (*)    All other components within normal limits  I-STAT CHEM 8, ED - Abnormal; Notable for the following:    Potassium 6.9 (*)    BUN 138 (*)    Creatinine, Ser 14.10 (*)    Calcium, Ion 1.04 (*)    All other components within normal limits  MAGNESIUM  BASIC METABOLIC PANEL  CBC  CBC  CREATININE, SERUM    EKG  EKG Interpretation None       Radiology Dg Chest 2 View  Result Date: 07/26/2016 CLINICAL DATA:  Shortness of breath and chest pain with history of recent rib fractures EXAM: CHEST  2 VIEW COMPARISON:  07/23/2016 FINDINGS: Cardiac shadow remains enlarged. A defibrillator is again seen. The lungs are well aerated bilaterally. Previously seen left basilar changes have improved in the interval from the prior exam. No sizable effusion is seen. No acute bony abnormality is noted. IMPRESSION: Improved aeration in the left lung base when compare with the prior exam. No new focal abnormality is seen. Electronically Signed   By: Alcide Clever M.D.   On: 07/26/2016 18:53    Procedures Procedures (including critical care time)  Medications Ordered in ED Medications  pentafluoroprop-tetrafluoroeth (GEBAUERS) aerosol 1 application (not administered)  lidocaine (PF) (XYLOCAINE) 1 % injection 5 mL (not administered)  lidocaine-prilocaine (EMLA) cream 1 application (not administered)  0.9 %  sodium chloride infusion (not administered)  0.9 %  sodium chloride infusion (not  administered)  heparin injection 1,000 Units (not administered)  alteplase (CATHFLO ACTIVASE) injection 2 mg (not administered)  heparin injection 1,000 Units (not administered)  calcitRIOL (ROCALTROL) capsule 0.5 mcg (not administered)  cinacalcet (SENSIPAR) tablet 30 mg (not administered)  sevelamer carbonate (RENVELA) tablet 1,600 mg (not administered)  LORazepam (ATIVAN) tablet 1 mg (not administered)  oxyCODONE (Oxy IR/ROXICODONE) immediate release tablet 5 mg (not administered)  carvedilol (COREG) tablet 12.5 mg (not administered)  hydrALAZINE (APRESOLINE) tablet 25 mg (not administered)  sertraline (ZOLOFT) tablet 100 mg (not administered)  isosorbide dinitrate (ISORDIL) tablet 10 mg (not administered)  multivitamin (RENA-VIT) tablet 1 tablet (not administered)  sevelamer carbonate (RENVELA) tablet 1,600-2,400 mg (not administered)  acetaminophen (TYLENOL) tablet 650 mg (not administered)    Or  acetaminophen (TYLENOL) suppository 650 mg (not administered)  ondansetron (ZOFRAN) tablet 4 mg (not administered)    Or  ondansetron (ZOFRAN) injection 4 mg (not administered)  heparin injection 5,000 Units (not administered)  alteplase (CATHFLO ACTIVASE) injection 1.9 mg (not administered)  alteplase (CATHFLO ACTIVASE) injection 1.9 mg (not administered)  alteplase (CATHFLO ACTIVASE) 2 MG injection (not administered)  HYDROmorphone (DILAUDID) injection 1 mg (1 mg Intravenous Given 07/26/16 1808)  insulin aspart (novoLOG) injection 10 Units (10 Units Intravenous Given 07/26/16 1846)  dextrose 50 % solution 50 mL (50 mLs Intravenous Given 07/26/16 1843)  calcium gluconate 1 g in sodium chloride 0.9 % 100 mL IVPB (0 g Intravenous Stopped 07/26/16 1911)  HYDROmorphone (DILAUDID) injection 1 mg (1 mg Intravenous Given 07/26/16 1848)     Initial Impression / Assessment and Plan / ED Course  I have reviewed the triage vital signs and the nursing notes.  Pertinent labs & imaging results that  were available  during my care of the patient were reviewed by me and considered in my medical decision making (see chart for details).  Clinical Course   [name] Small is a 25 y.o. female with h/o ESRD on MWF Hd, ADPKD, sickle cell anemia, severe cardiomyopathy with AICD, who presents to ED via EMS from home for worsening dyspnea over the past 2 days, has missed dialysis Monday and Wednesday (last dialyzed Friday, 5 days ago). States she has been missing dialysis 2/2 pain of right shoulder, right lateral chest wall since fall 4 days ago, for which she was evaluated at the Ed. Pt denies any chest pain or discomfort. Denies fevers. Doubt sickle cell crisis or acute chest syndrome. Peaked T waves on EKG and potassium 7, given calcium gluconate, insulin, D50 to temporize. Consulted nephrology, will arrange emergent dialysis. Admitted to medicine for further management, as will require several days of dialysis and may require new graft for Hd, as previous graft clotted off, so pt has been receiving HD most recently via left groin catheter.  Pt condition, course, and admission were discussed with attending physician Dr. Marily Memos.    Final Clinical Impressions(s) / ED Diagnoses   Final diagnoses:  Back pain    New Prescriptions Current Discharge Medication List       Horald Pollen, MD 07/27/16 0041    Marily Memos, MD 07/27/16 [phone]

## 2016-07-27 ENCOUNTER — Inpatient Hospital Stay (HOSPITAL_COMMUNITY): Payer: Medicare Other

## 2016-07-27 DIAGNOSIS — T82868A Thrombosis of vascular prosthetic devices, implants and grafts, initial encounter: Secondary | ICD-10-CM

## 2016-07-27 DIAGNOSIS — Z992 Dependence on renal dialysis: Secondary | ICD-10-CM

## 2016-07-27 DIAGNOSIS — S22000A Wedge compression fracture of unspecified thoracic vertebra, initial encounter for closed fracture: Secondary | ICD-10-CM

## 2016-07-27 DIAGNOSIS — N186 End stage renal disease: Secondary | ICD-10-CM

## 2016-07-27 DIAGNOSIS — I10 Essential (primary) hypertension: Secondary | ICD-10-CM

## 2016-07-27 LAB — CBC
HCT: 31.9 % — ABNORMAL LOW (ref 36.0–46.0)
HEMATOCRIT: 31.8 % — AB (ref 36.0–46.0)
HEMOGLOBIN: 10.2 g/dL — AB (ref 12.0–15.0)
Hemoglobin: 10.1 g/dL — ABNORMAL LOW (ref 12.0–15.0)
MCH: 28.3 pg (ref 26.0–34.0)
MCH: 27.9 pg (ref 26.0–34.0)
MCHC: 32.1 g/dL (ref 30.0–36.0)
MCHC: 31.7 g/dL (ref 30.0–36.0)
MCV: 88.3 fL (ref 78.0–100.0)
MCV: 88.1 fL (ref 78.0–100.0)
Platelets: 138 10*3/uL — ABNORMAL LOW (ref 150–400)
Platelets: 128 10*3/uL — ABNORMAL LOW (ref 150–400)
RBC: 3.6 MIL/uL — ABNORMAL LOW (ref 3.87–5.11)
RBC: 3.62 MIL/uL — ABNORMAL LOW (ref 3.87–5.11)
RDW: 18.6 % — AB (ref 11.5–15.5)
RDW: 18.6 % — ABNORMAL HIGH (ref 11.5–15.5)
WBC: 5.9 10*3/uL (ref 4.0–10.5)
WBC: 6 K/uL (ref 4.0–10.5)

## 2016-07-27 LAB — RENAL FUNCTION PANEL
Albumin: 3 g/dL — ABNORMAL LOW (ref 3.5–5.0)
Anion gap: 18 — ABNORMAL HIGH (ref 5–15)
BUN: 86 mg/dL — ABNORMAL HIGH (ref 6–20)
CO2: 21 mmol/L — ABNORMAL LOW (ref 22–32)
Calcium: 10.1 mg/dL (ref 8.9–10.3)
Chloride: 99 mmol/L — ABNORMAL LOW (ref 101–111)
Creatinine, Ser: 10.34 mg/dL — ABNORMAL HIGH (ref 0.44–1.00)
GFR calc Af Amer: 5 mL/min — ABNORMAL LOW (ref >60)
GFR calc non Af Amer: 5 mL/min — ABNORMAL LOW (ref >60)
Glucose, Bld: 97 mg/dL (ref 65–99)
Phosphorus: 9 mg/dL — ABNORMAL HIGH (ref 2.5–4.6)
Potassium: 5.2 mmol/L — ABNORMAL HIGH (ref 3.5–5.1)
Sodium: 138 mmol/L (ref 135–145)

## 2016-07-27 LAB — BASIC METABOLIC PANEL
Anion gap: 17 — ABNORMAL HIGH (ref 5–15)
BUN: 85 mg/dL — AB (ref 6–20)
CHLORIDE: 100 mmol/L — AB (ref 101–111)
CO2: 21 mmol/L — AB (ref 22–32)
Calcium: 10 mg/dL (ref 8.9–10.3)
Creatinine, Ser: 10.79 mg/dL — ABNORMAL HIGH (ref 0.44–1.00)
GFR calc Af Amer: 5 mL/min — ABNORMAL LOW (ref >60)
GFR calc non Af Amer: 4 mL/min — ABNORMAL LOW (ref >60)
GLUCOSE: 95 mg/dL (ref 65–99)
POTASSIUM: 5.3 mmol/L — AB (ref 3.5–5.1)
Sodium: 138 mmol/L (ref 135–145)

## 2016-07-27 LAB — MRSA PCR SCREENING: MRSA by PCR: NEGATIVE

## 2016-07-27 MED ORDER — HYDROMORPHONE HCL 1 MG/ML IJ SOLN
1.0000 mg | INTRAMUSCULAR | Status: DC | PRN
  Administered 2016-07-27 – 2016-07-29 (×10): 1 mg via INTRAVENOUS
  Filled 2016-07-27 (×8): qty 1

## 2016-07-27 MED ORDER — ALTEPLASE 2 MG IJ SOLR
1.9000 mg | Freq: Once | INTRAMUSCULAR | Status: AC
  Administered 2016-07-27: 1.9 mg

## 2016-07-27 MED ORDER — SEVELAMER CARBONATE 800 MG PO TABS: 1600.0000 mg | ORAL_TABLET | ORAL | Status: DC | PRN

## 2016-07-27 MED ORDER — SODIUM CHLORIDE 0.9 % IV SOLN: 100.0000 mL | INTRAVENOUS | Status: DC | PRN

## 2016-07-27 MED ORDER — HEPARIN SODIUM (PORCINE) 1000 UNIT/ML DIALYSIS: 20.0000 [IU]/kg | INTRAMUSCULAR | Status: DC | PRN

## 2016-07-27 MED ORDER — OXYCODONE-ACETAMINOPHEN 5-325 MG PO TABS: 2.0000 | ORAL_TABLET | Freq: Once | ORAL | Status: DC

## 2016-07-27 MED ORDER — ALTEPLASE 2 MG IJ SOLR
INTRAMUSCULAR | Status: AC
  Administered 2016-07-27: 1.9 mg
  Filled 2016-07-27: qty 4

## 2016-07-27 MED ORDER — ONDANSETRON HCL 4 MG/2ML IJ SOLN
INTRAMUSCULAR | Status: AC
  Filled 2016-07-27: qty 2

## 2016-07-27 MED ORDER — HYDROCERIN EX CREA
TOPICAL_CREAM | Freq: Two times a day (BID) | CUTANEOUS | Status: DC
  Administered 2016-07-28 – 2016-08-03 (×6): via TOPICAL
  Filled 2016-07-27: qty 113

## 2016-07-27 MED ORDER — OXYCODONE HCL 5 MG PO TABS
5.0000 mg | ORAL_TABLET | Freq: Four times a day (QID) | ORAL | Status: DC
  Administered 2016-07-27 (×2): 5 mg via ORAL
  Filled 2016-07-27 (×2): qty 1

## 2016-07-27 MED ORDER — HYDROMORPHONE HCL 1 MG/ML IJ SOLN
INTRAMUSCULAR | Status: AC
  Filled 2016-07-27: qty 1

## 2016-07-27 MED ORDER — HYDROMORPHONE HCL 1 MG/ML IJ SOLN
0.5000 mg | Freq: Once | INTRAMUSCULAR | Status: AC
  Administered 2016-07-27: 0.5 mg via INTRAVENOUS
  Filled 2016-07-27: qty 1

## 2016-07-27 MED ORDER — OXYCODONE HCL 5 MG PO TABS: 5.0000 mg | ORAL_TABLET | Freq: Four times a day (QID) | ORAL | Status: DC | PRN

## 2016-07-27 MED ORDER — SEVELAMER CARBONATE 800 MG PO TABS
2400.0000 mg | ORAL_TABLET | Freq: Three times a day (TID) | ORAL | Status: DC
  Administered 2016-07-27 – 2016-08-04 (×11): 2400 mg via ORAL
  Filled 2016-07-27 (×15): qty 3

## 2016-07-27 NOTE — Progress Notes (Signed)
Dialysis treatment completed.  4000 mL ultrafiltrated.  3500 mL net fluid removal.  Patient status unchanged. Lung sounds diminished to ausculation in all fields. Generalized edema. Cardiac: NSR.  Cleansed L thigh catheter with chlorhexidine.  Disconnected lines and flushed ports with saline per protocol.  Ports locked with Cathflo and capped per protocol.    Report given to bedside, RN Jasmine December.

## 2016-07-27 NOTE — H&P (Signed)
Patient ID: [name] Small MRN: [id number] DOB/AGE: 1991/08/16 25 y.o.  Admit date: 07/26/2016  Admission Diagnoses:  Principal Problem:   Acute pulmonary edema (HCC) Active Problems:   Hyperkalemia   ESRD on dialysis (HCC)   AICD (automatic cardioverter/defibrillator) present   Failed kidney transplant   NICM (nonischemic cardiomyopathy) (HCC)   Essential hypertension   Thoracic compression fracture - T7, T8, T12   HPI: Pleasant 25 year old female pt with a past med hx significant for CKD requiring dyalysis.  Pt reports a recent fall out of bed.  After this occurred she has experienced severe Back pain.  She denies leg pain.  She reports the pain is so severe that it is limiting function.   Past Medical History: Past Medical History:  Diagnosis Date  . 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 Anemia   . Anxiety   . Cardiomyopathy   . Cellulitis and abscess of face 03/22/2013  . CHF (congestive heart failure) (HCC)   . Chronic anticoagulation   . Chronic pain   . Depression   . Dysrhythmia    at times per pt.  . 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 ESRD (end stage renal disease) on dialysis [facility])    "MWF; [facility]" (04/11/2016)  . GERD (gastroesophageal reflux disease)   . H/O transfusion of packed red blood cells   . Hemodialysis patient (HCC)    Mon. Wed. Fri  . Hyperkalemia 09/2015  . Hypertension   . Narcotic abuse, continuous   . Osteoporosis   . Pelvic fracture (HCC) 06/2015   "on the right"  . Polycystic kidney disease   . Pulmonary emboli (HCC) 01/2012   Bilateral, moderate clot burden, areas of pulmonary infarction and central necrosis  . Renal insufficiency   . Sickle cell anemia (HCC)     Surgical History: Past Surgical History:  Procedure Laterality Date  . AV FISTULA PLACEMENT  Bilateral    "right side stopped working & never had it revised" (04/11/2016)  . CARDIAC CATHETERIZATION    . IMPLANTABLE CARDIOVERTER DEFIBRILLATOR IMPLANT Right 12/2014  . INCISION AND DRAINAGE ABSCESS Right 03/21/2013   Procedure: INCISION AND DRAINAGE RIGHT CHEEK ABSCESS REMOVAL OF FOREIGN BODY;  Surgeon: Serena Colonel, MD;  Location: [facility] OR;  Service: ENT;  Laterality: Right;  . INSERTION OF DIALYSIS CATHETER Right Mar 09, 2016   thigh, Dr. Wyn Quaker, [facility]  . 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 NEPHRECTOMY Right    third kidney placed in 2008, and body rejected in 2012   . PERIPHERAL VASCULAR CATHETERIZATION Left 03/09/2016   Procedure: A/V Shuntogram/Fistulagram;  Surgeon: Annice Needy, MD;  Location: [facility] INVASIVE CV LAB;  Service: Cardiovascular;  Laterality: Left;  . PERIPHERAL VASCULAR CATHETERIZATION N/A 05/09/2016   Procedure: Dialysis/Perma Catheter Removal;  Surgeon: Renford Dills, MD;  Location: [facility] INVASIVE CV LAB;  Service: Cardiovascular;  Laterality: N/A;  . PERIPHERAL VASCULAR CATHETERIZATION N/A 07/12/2016   Procedure: Dialysis/Perma Catheter Insertion;  Surgeon: Annice Needy, MD;  Location: [facility] INVASIVE CV LAB;  Service: Cardiovascular;  Laterality: N/A;  . REVISON OF ARTERIOVENOUS FISTULA Left 03/22/2016   Procedure: REVISON OF ARTERIOVENOUS FISTULA ( ARTEGRAFT );  Surgeon: Annice Needy, MD;  Location: [facility] ORS;  Service: Vascular;  Laterality: Left;  . TONSILLECTOMY AND ADENOIDECTOMY  ~ 2000    Family History: Family History  Problem Relation Age of Onset  . Polycystic kidney disease Father   . Hypertension Father     Social History: Social History   Social History  . Marital status: Single    Spouse name: N/A  . Number of children: N/A  . Years of education: N/A   Occupational History  . Not on file.   Social History Main Topics  . Smoking status: Former Smoker    Packs/day: 0.00    Years: 1.00    Types: Cigarettes    Quit date: 12/08/2015  . Smokeless  tobacco: Never Used  . Alcohol use No  . Drug use: No  . Sexual activity: Not Currently    Birth control/ protection: None   Other Topics Concern  . Not on file   Social History Narrative   Was smoking 3 cigs per day. Lives at home with roommates, has some family locally.     Allergies: Aspirin; Tramadol; Vicodin [hydrocodone-acetaminophen]; Buprenorphine hcl; Iohexol; and Morphine and related  Medications: I have reviewed the patient's current medications.  Vital Signs: Patient Vitals for the past 24 hrs:  BP Temp Temp src Pulse Resp SpO2 Height Weight  07/27/16 1749 103/74 - - 92 - - - -  07/27/16 1650 (!) 97/53 98.2 F (36.8 C) Oral 90 20 96 % - -  07/27/16 1615 (!) 95/57 98 F (36.7 C) Oral 90 18 95 % - 53.6 kg (118 lb 2.7 oz)  07/27/16 1600 (!) 86/48 - - 92 - - - -  07/27/16 1530 (!) 97/47 - - 92 - - - -  07/27/16 1500 (!) 92/45 - - 93 - - - -  07/27/16 1430 (!) 95/47 - - 97 - - - -  07/27/16 1400 (!) 93/51 - - 96 - - - -  07/27/16 1330 (!) 104/47 - - (!) 101 - - - -  07/27/16 1300 (!) 101/59 - - (!) 102 - - - -  07/27/16 1220 110/60 - - 80 - - - -  07/27/16 1215 110/66 98.3 F (36.8 C) Oral 64 18 98 % - 57.6 kg (126 lb 15.8 oz)  07/27/16 0900 135/89 98.6 F (37 C) Oral (!) 105 20 98 % - -  07/27/16 0543 130/89 98.6 F (37 C) Oral (!) 104 19 97 % - -  07/27/16 0110 (!) 146/99 98.5 F (36.9 C) Oral (!) 104 20 98 % 5\' 2"  (1.575 m) 52.6 kg (115 lb 15.4 oz)  07/27/16 0025 (!) 132/94 - - 97 - - - -  07/27/16 0021 (!) 125/98 97.8 F (36.6 C) - 97 19 - - -  07/27/16 0000 (!) 117/91 - - 99 - - - -  07/26/16 2330 117/86 - - 98 - - - -  07/26/16 2300 112/80 - - 100 - - - -  07/26/16 2230 (!) 121/98 - - (!) 104 - - - -  07/26/16 2200 (!) 125/93 - - (!) 106 - - - -  07/26/16 2130 138/90 - - (!) 107 - - - -  07/26/16 2100 111/80 - - (!) 101 - - - -  07/26/16 2030 (!) 120/94 - - 99 - - - -  07/26/16 2021 (!) 123/101 - - 99 - - - -  07/26/16 2009 137/89 98 F (36.7 C) -  97 (!) 25 - - -  07/26/16 1945 114/76 - - 100 17 97 % - -  07/26/16 1930 122/93 - - 106 20 96 % - -  07/26/16 1915 - - - -  25 - - -  07/26/16 1845 - - - 88 26 (!) 83 % - -  07/26/16 1831 (!) 135/113 - - 112 24 100 % - -  07/26/16 1806 (!) 130/105 - - - - - - -    Radiology: Dg Chest 2 View  Result Date: 07/26/2016 CLINICAL DATA:  Shortness of breath and chest pain with history of recent rib fractures EXAM: CHEST  2 VIEW COMPARISON:  07/23/2016 FINDINGS: Cardiac shadow remains enlarged. A defibrillator is again seen. The lungs are well aerated bilaterally. Previously seen left basilar changes have improved in the interval from the prior exam. No sizable effusion is seen. No acute bony abnormality is noted. IMPRESSION: Improved aeration in the left lung base when compare with the prior exam. No new focal abnormality is seen. Electronically Signed   By: Alcide Clever M.D.   On: 07/26/2016 18:53   Dg Ribs Unilateral W/chest Right  Result Date: 07/23/2016 CLINICAL DATA:  Fall at home with right rib pain. Initial encounter. EXAM: RIGHT RIBS AND CHEST - 3+ VIEW COMPARISON:  07/01/2016 FINDINGS: Single chamber pacer/ICD battery pack obscures some of the right ribs. A marker is present more inferiorly and these levels are negative for acute fracture. No hemothorax or pneumothorax on the right. There is infrahilar opacity on the left with small pleural effusion. Chronic marked cardiopericardial enlargement and pulmonary venous congestion. IVC filter. IMPRESSION: 1. Negative for fracture along the symptomatic lower right ribs. No pneumothorax. 2. Left lower lobe atelectasis or pneumonia with small pleural effusion. 3. Chronic marked cardiomegaly and pulmonary venous congestion. Electronically Signed   By: Marnee Spring M.D.   On: 07/23/2016 21:53   Dg Thoracic Spine 2 View  Result Date: 07/24/2016 CLINICAL DATA:  25 y/o  F; fall at home with severe mid back pain. EXAM: THORACIC SPINE 2 VIEWS COMPARISON:   06/17/2016 CT of the chest. FINDINGS: Single lead AICD. Mild S-shaped curvature of thoracic spine. IVC filter. Mild anterior loss of height of a mid thoracic vertebral body, possibly T8, appears new from prior CT. IMPRESSION: Suspected new mild anterior compression deformity of T8. Confirmation with thoracic CT or MRI is recommended. These results will be called to the ordering clinician or representative by the Radiologist Assistant, and communication documented in the PACS or zVision Dashboard. Electronically Signed   By: Mitzi Hansen M.D.   On: 07/24/2016 00:38   Ct Thoracic Spine Wo Contrast  Result Date: 07/27/2016 CLINICAL DATA:  25 year old female with progressive upper back pain and shortness of breath after falling. EXAM: CT THORACIC SPINE WITHOUT CONTRAST TECHNIQUE: Multidetector CT imaging of the thoracic spine was performed without intravenous contrast administration. Multiplanar CT image reconstructions were also generated. COMPARISON:  Prior chest CT 06/17/2016. Prior CT scan of the abdomen and pelvis 10/26/2015 FINDINGS: Alignment: Slightly exaggerated thoracic kyphosis. No scoliotic curvature. Vertebrae: Acute nondisplaced fracture through the posterior aspect of the right fifth rib. Subtle compression fracture of the anterior aspect of T7 with buckling of the cortex. There is approximately 18- 19% height loss anteriorly. No posterior retropulsion. Findings are new compared to prior chest CT 06/17/2016. Very subtle probable compression deformity of the superior endplate of T8 without height loss. Additionally, there is subtle buckling of the anterior cortex at T12 which is less specific but new compared to October 26, 2015. No associated height loss. Diffuse demineralization of the bones suggests osteoporosis. Paraspinal and other soft tissues: No paraspinal mass or hematoma. Disc levels: Moderately large right pleural effusion. Incompletely  imaged right subclavian approach  intracardiac defibrillator. Cardiomegaly. Moderate pericardial effusion. Incompletely imaged IVC filter. Numerous bilateral renal cysts and calcifications of varying complexity consistent with the clinical history of polycystic kidney disease. IMPRESSION: 1. Acute to subacute fracture of T7 with 18-19% anterior height loss. No evidence of posterior retropulsion. 2. Suspect subtle compression deformity of the superior endplate of T8 without associated height loss. 3. Similarly, probable acute to subacute fracture involving the anterior cortex of T12 without evidence of associated height loss. This finding is new compared to October 26, 2015 and has likely occurred at some point since that time. 4. Diffusely abnormal bone and marrow density with areas of relatively increased sclerosis and osteopenia. Findings may represent aggressive osteopenia, or renal osteodystrophy. 5. Moderately large right pleural effusion without significant interval change compared 06/17/2016. 6. Nondisplaced fracture the posterior aspect of the right fifth rib. 7. Cardiomegaly. 8. Moderate pericardial effusion. 9. Numerous renal cysts of varying complexity bilaterally. Electronically Signed   By: Malachy MoanHeath  McCullough M.D.   On: 07/27/2016 10:22    Labs:  Recent Labs  07/27/16 1140 07/27/16 1230  WBC 5.9 6.0  RBC 3.60* 3.62*  HCT 31.8* 31.9*  PLT 138* 128*    Recent Labs  07/27/16 1140 07/27/16 1230  NA 138 138  K 5.3* 5.2*  CL 100* 99*  CO2 21* 21*  BUN 85* 86*  CREATININE 10.79* 10.34*  GLUCOSE 95 97  CALCIUM 10.0 10.1   No results for input(s): LABPT, INR in the last 72 hours.  Review of Systems: ROS  Physical Exam: Neurologically intact ABD soft Sensation intact distally Dorsiflexion/Plantar flexion intact Compartment soft Severe TTP of the thoracic spine and paraspinals  Assessment and Plan: I will consult with Dr. Shon BatonBrooks.  He will decide if the pt will benefit from surgical intertension.  Kyphoplasty  performed by [facility] is being considered Nephrology will continue to monitor and oversee  [facility] ChildrenCarmen Mayo for Venita Lickahari Chadley Dziedzic, MD [facility] [phone]   Agree with above No focal neurologic deficits Ambulatory No signs of myelopathy CT scan show minor compression deformities Need definitive plan to manage osteoporosis to prevent additional fractures Will order TLSO for management of current fractures Will monitor progress

## 2016-07-27 NOTE — Progress Notes (Signed)
   Daily Progress Note   Fully consult to follow.  Pt has more pressing medical issues at this time.  Vein mapping ordered.    Leonides Sake, MD, FACS [facility] Office: [phone] Pager: [phone]  07/27/2016, 12:14 PM

## 2016-07-27 NOTE — Consult Note (Addendum)
Referred by:  Dr. Arlean Hopping  Reason for referral: Salvage of left upper arm AVG  History of Present Illness  [name] Small is a 25 y.o. (1991-10-22) female with multiple co-morbidities including poor compliance who presents with cc: back pain.  The patient is right hand dominant.  The patient has had previous access procedures: reported in right arm and left BVT.  Recent central venous cannulation procedures include: R and L femoral TDC.  The patient was admitted for T spine fractures.  .  The patient has had a R SCV PPM placed.   Past Medical History:  Diagnosis Date  . 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 Anemia   . Anxiety   . Cardiomyopathy   . Cellulitis and abscess of face 03/22/2013  . CHF (congestive heart failure) (HCC)   . Chronic anticoagulation   . Chronic pain   . Depression   . Dysrhythmia    at times per pt.  . 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 ESRD (end stage renal disease) on dialysis [facility])    "MWF; [facility]" (04/11/2016)  . GERD (gastroesophageal reflux disease)   . H/O transfusion of packed red blood cells   . Hemodialysis patient (HCC)    Mon. Wed. Fri  . Hyperkalemia 09/2015  . Hypertension   . Narcotic abuse, continuous   . Osteoporosis   . Pelvic fracture (HCC) 06/2015   "on the right"  . Polycystic kidney disease   . Pulmonary emboli (HCC) 01/2012   Bilateral, moderate clot burden, areas of pulmonary infarction and central necrosis  . Renal insufficiency   . Sickle cell anemia (HCC)     Past Surgical History:  Procedure Laterality Date  . AV FISTULA PLACEMENT Bilateral    "right side stopped working & never had it revised" (04/11/2016)  . CARDIAC CATHETERIZATION    . IMPLANTABLE CARDIOVERTER DEFIBRILLATOR IMPLANT Right 12/2014  . INCISION AND DRAINAGE ABSCESS Right 03/21/2013    Procedure: INCISION AND DRAINAGE RIGHT CHEEK ABSCESS REMOVAL OF FOREIGN BODY;  Surgeon: Serena Colonel, MD;  Location: [facility] OR;  Service: ENT;  Laterality: Right;  . INSERTION OF DIALYSIS CATHETER Right Mar 09, 2016   thigh, Dr. Wyn Quaker, [facility]  . 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 NEPHRECTOMY Right    third kidney placed in 2008, and body rejected in 2012   . PERIPHERAL VASCULAR CATHETERIZATION Left 03/09/2016   Procedure: A/V Shuntogram/Fistulagram;  Surgeon: Annice Needy, MD;  Location: [facility] INVASIVE CV LAB;  Service: Cardiovascular;  Laterality: Left;  . PERIPHERAL VASCULAR CATHETERIZATION N/A 05/09/2016   Procedure: Dialysis/Perma Catheter Removal;  Surgeon: Renford Dills, MD;  Location: [facility] INVASIVE CV LAB;  Service: Cardiovascular;  Laterality: N/A;  . PERIPHERAL VASCULAR CATHETERIZATION N/A 07/12/2016   Procedure: Dialysis/Perma Catheter Insertion;  Surgeon: Annice Needy, MD;  Location: [facility] INVASIVE CV LAB;  Service: Cardiovascular;  Laterality: N/A;  . REVISON OF ARTERIOVENOUS FISTULA Left 03/22/2016   Procedure: REVISON OF ARTERIOVENOUS FISTULA ( ARTEGRAFT );  Surgeon: Annice Needy, MD;  Location: [facility] ORS;  Service: Vascular;  Laterality: Left;  . TONSILLECTOMY AND ADENOIDECTOMY  ~ 2000    Social History   Social History  . Marital status: Single    Spouse name: N/A  . Number of children: N/A  . Years of education: N/A   Occupational History  . Not  on file.   Social History Main Topics  . Smoking status: Former Smoker    Packs/day: 0.00    Years: 1.00    Types: Cigarettes    Quit date: 12/08/2015  . Smokeless tobacco: Never Used  . Alcohol use No  . Drug use: No  . Sexual activity: Not Currently    Birth control/ protection: None   Other Topics Concern  . Not on file   Social History Narrative   Was smoking 3 cigs per day. Lives at home with roommates, has some family locally.     Family History  Problem Relation Age of Onset  . Polycystic kidney disease Father     . Hypertension Father     Current Facility-Administered Medications  Medication Dose Route Frequency Provider Last Rate Last Dose  . 0.9 %  sodium chloride infusion  100 mL Intravenous PRN Annie Sable, MD      . 0.9 %  sodium chloride infusion  100 mL Intravenous PRN Annie Sable, MD      . acetaminophen (TYLENOL) tablet 650 mg  650 mg Oral Q6H PRN Eduard Clos, MD   650 mg at 07/27/16 1754   Or  . acetaminophen (TYLENOL) suppository 650 mg  650 mg Rectal Q6H PRN Eduard Clos, MD      . alteplase (CATHFLO ACTIVASE) injection 2 mg  2 mg Intracatheter Once PRN Annie Sable, MD      . Melene Muller ON 07/28/2016] calcitRIOL (ROCALTROL) capsule 0.5 mcg  0.5 mcg Oral Q M,W,F-HD Annie Sable, MD      . carvedilol (COREG) tablet 12.5 mg  12.5 mg Oral BID WC Eduard Clos, MD   12.5 mg at 07/27/16 1136  . cinacalcet (SENSIPAR) tablet 30 mg  30 mg Oral BID WC Annie Sable, MD   30 mg at 07/27/16 1753  . heparin injection 1,000 Units  1,000 Units Dialysis PRN Annie Sable, MD      . heparin injection 5,000 Units  5,000 Units Subcutaneous Q8H Eduard Clos, MD      . hydrALAZINE (APRESOLINE) tablet 25 mg  25 mg Oral TID Eduard Clos, MD   25 mg at 07/27/16 1135  . hydrocerin (EUCERIN) cream   Topical BID Calvert Cantor, MD      . HYDROmorphone (DILAUDID) injection 1 mg  1 mg Intravenous Q4H PRN Calvert Cantor, MD   1 mg at 07/27/16 1440  . isosorbide dinitrate (ISORDIL) tablet 10 mg  10 mg Oral TID Eduard Clos, MD   10 mg at 07/27/16 1754  . lidocaine (PF) (XYLOCAINE) 1 % injection 5 mL  5 mL Intradermal PRN Annie Sable, MD      . lidocaine-prilocaine (EMLA) cream 1 application  1 application Topical PRN Annie Sable, MD      . LORazepam (ATIVAN) tablet 1 mg  1 mg Oral Daily PRN Eduard Clos, MD      . multivitamin (RENA-VIT) tablet 1 tablet  1 tablet Oral QHS Eduard Clos, MD   1 tablet at 07/27/16  0142  . ondansetron (ZOFRAN) tablet 4 mg  4 mg Oral Q6H PRN Eduard Clos, MD       Or  . ondansetron [facility]) injection 4 mg  4 mg Intravenous Q6H PRN Eduard Clos, MD   4 mg at 07/27/16 1233  . oxyCODONE (Oxy IR/ROXICODONE) immediate release tablet 5 mg  5 mg Oral QID Calvert Cantor, MD   5 mg at 07/27/16 1137  .  pentafluoroprop-tetrafluoroeth (GEBAUERS) aerosol 1 application  1 application Topical PRN Annie Sable, MD      . sertraline (ZOLOFT) tablet 100 mg  100 mg Oral Daily Eduard Clos, MD   100 mg at 07/27/16 1135  . sevelamer carbonate (RENVELA) tablet 1,600 mg  1,600 mg Oral PRN Eduard Clos, MD      . sevelamer carbonate (RENVELA) tablet 2,400 mg  2,400 mg Oral TID WC Eduard Clos, MD   2,400 mg at 07/27/16 1753    Allergies  Allergen Reactions  . Aspirin Other (See Comments)    Interacts with Coreg  . Tramadol Anaphylaxis  . Vicodin [Hydrocodone-Acetaminophen] Hives  . Buprenorphine Hcl Itching and Other (See Comments)    Ok with oxycodone  . Iohexol Itching  . Morphine And Related Itching and Other (See Comments)    Ok with oxycodone    REVIEW OF SYSTEMS:   Cardiac:  positive for: no symptoms, negative for: Chest pain or chest pressure, Shortness of breath upon exertion and Shortness of breath when lying flat,   Vascular:  positive for: no symptoms,  negative for: Pain in calf, thigh, or hip brought on by ambulation, Pain in feet at night that wakes you up from your sleep, Blood clot in your veins and Leg swelling  Pulmonary:  positive for: no symptoms,  negative for: Oxygen at home, Productive cough and Wheezing  Neurologic:  positive for: No symptoms, negative for: Sudden numbness in arms or legs, Sudden onset of difficulty speaking or slurred speech, Temporary loss of vision in one eye and Problems with dizziness  Gastrointestinal:  positive for: no symptoms, negative for: Blood in stool and Vomited  blood  Genitourinary:  positive for: no symptoms, negative for: Burning when urinating and Blood in urine  Psychiatric:  positive for: no symptoms,  negative for: Major depression  Hematologic:  positive for: no symptoms,  negative for: negative for: Bleeding problems and Problems with blood clotting too easily  Dermatologic:  positive for: no symptoms, negative for: Rashes or ulcers  Constitutional:  positive for: no symptoms, negative for: Fever or chills  Ear/Nose/Throat:  positive for: no symptoms, negative for: Change in hearing, Nose bleeds and Sore throat  Musculoskeletal:  positive for: no symptoms, negative for: Back pain, Joint pain and Muscle pain   Physical Examination  Vitals:   07/27/16 1600 07/27/16 1615 07/27/16 1650 07/27/16 1749  BP: (!) 86/48 (!) 95/57 (!) 97/53 103/74  Pulse: 92 90 90 92  Resp:  18 20   Temp:  98 F (36.7 C) 98.2 F (36.8 C)   TempSrc:  Oral Oral   SpO2:  95% 96%   Weight:  118 lb 2.7 oz (53.6 kg)    Height:        Body mass index is 21.61 kg/m.  General: Alert, O x 3, WD,Ill appearing  Head: [place]/AT,   Ear/Nose/Throat: Hearing grossly intact, nares without erythema or drainage, oropharynx without Erythema or Exudate , Mallampati score: 3, Dentition intact  Eyes: PERRLA, EOMI,   Neck: Supple, mid-line trachea,  no obvious bulging veins  Pulmonary: Sym exp, good B air movt,CTA B, not obvious bulging chest veins  Cardiac: RRR, Nl S1, S2, no Murmurs, No rubs, No S3,S4  Vascular: Vessel Right Left  Radial Not palpable Not palpable  Brachial Palpable Palpable  Carotid Palpable, No Bruit Palpable, No Bruit  Aorta Not palpable N/A  Femoral Palpable Palpable  Popliteal Not palpable Not palpable  PT Palpable Palpable  DP Palpable Palpable   Gastrointestinal: soft, non-distended, non-tender to palpation, No guarding or rebound, no HSM, no masses, no CVAT B, No palpable prominent aortic pulse, healed surgical  incisions  Musculoskeletal: M/S 5/5 in BUE, Extremities without ischemic changes  , Edema in B legs, thrombosed LUA AVG  Neurologic: CN 2-12 intact , Pain and light touch intact in extremities , Motor exam as listed above  Psychiatric: Judgement intact, Mood & affect appropriate for pt's clinical situation  Dermatologic: See M/S exam for extremity exam, No rashes otherwise noted  Lymph : Palpable lymph nodes: None  Laboratory: CBC:    Component Value Date/Time   WBC 6.0 07/27/2016 1230   RBC 3.62 (L) 07/27/2016 1230   HGB 10.1 (L) 07/27/2016 1230   HCT 31.9 (L) 07/27/2016 1230   PLT 128 (L) 07/27/2016 1230   MCV 88.1 07/27/2016 1230   MCH 27.9 07/27/2016 1230   MCHC 31.7 07/27/2016 1230   RDW 18.6 (H) 07/27/2016 1230   LYMPHSABS 1.0 07/26/2016 1803   MONOABS 0.6 07/26/2016 1803   EOSABS 0.0 07/26/2016 1803   BASOSABS 0.0 07/26/2016 1803    BMP:    Component Value Date/Time   NA 138 07/27/2016 1230   K 5.2 (H) 07/27/2016 1230   CL 99 (L) 07/27/2016 1230   CO2 21 (L) 07/27/2016 1230   GLUCOSE 97 07/27/2016 1230   BUN 86 (H) 07/27/2016 1230   CREATININE 10.34 (H) 07/27/2016 1230   CALCIUM 10.1 07/27/2016 1230   GFRNONAA 5 (L) 07/27/2016 1230   GFRAA 5 (L) 07/27/2016 1230    Coagulation: Lab Results  Component Value Date   INR 1.35 06/17/2016   INR 1.55 (H) 04/12/2016   INR 1.35 03/31/2016   No results found for: PTT  Lipids:    Component Value Date/Time   CHOL 137 06/17/2016 0413   TRIG 51 06/17/2016 0413   HDL 58 06/17/2016 0413   CHOLHDL 2.4 06/17/2016 0413   VLDL 10 06/17/2016 0413   LDLCALC 69 06/17/2016 0413    Radiology: Dg Chest 2 View  Result Date: 07/26/2016 CLINICAL DATA:  Shortness of breath and chest pain with history of recent rib fractures EXAM: CHEST  2 VIEW COMPARISON:  07/23/2016 FINDINGS: Cardiac shadow remains enlarged. A defibrillator is again seen. The lungs are well aerated bilaterally. Previously seen left basilar changes have  improved in the interval from the prior exam. No sizable effusion is seen. No acute bony abnormality is noted. IMPRESSION: Improved aeration in the left lung base when compare with the prior exam. No new focal abnormality is seen. Electronically Signed   By: Alcide Clever M.D.   On: 07/26/2016 18:53   Ct Thoracic Spine Wo Contrast  Result Date: 07/27/2016 CLINICAL DATA:  25 year old female with progressive upper back pain and shortness of breath after falling. EXAM: CT THORACIC SPINE WITHOUT CONTRAST TECHNIQUE: Multidetector CT imaging of the thoracic spine was performed without intravenous contrast administration. Multiplanar CT image reconstructions were also generated. COMPARISON:  Prior chest CT 06/17/2016. Prior CT scan of the abdomen and pelvis 10/26/2015 FINDINGS: Alignment: Slightly exaggerated thoracic kyphosis. No scoliotic curvature. Vertebrae: Acute nondisplaced fracture through the posterior aspect of the right fifth rib. Subtle compression fracture of the anterior aspect of T7 with buckling of the cortex. There is approximately 18- 19% height loss anteriorly. No posterior retropulsion. Findings are new compared to prior chest CT 06/17/2016. Very subtle probable compression deformity of the superior endplate of T8 without height loss. Additionally, there is subtle  buckling of the anterior cortex at T12 which is less specific but new compared to October 26, 2015. No associated height loss. Diffuse demineralization of the bones suggests osteoporosis. Paraspinal and other soft tissues: No paraspinal mass or hematoma. Disc levels: Moderately large right pleural effusion. Incompletely imaged right subclavian approach intracardiac defibrillator. Cardiomegaly. Moderate pericardial effusion. Incompletely imaged IVC filter. Numerous bilateral renal cysts and calcifications of varying complexity consistent with the clinical history of polycystic kidney disease. IMPRESSION: 1. Acute to subacute fracture of T7  with 18-19% anterior height loss. No evidence of posterior retropulsion. 2. Suspect subtle compression deformity of the superior endplate of T8 without associated height loss. 3. Similarly, probable acute to subacute fracture involving the anterior cortex of T12 without evidence of associated height loss. This finding is new compared to October 26, 2015 and has likely occurred at some point since that time. 4. Diffusely abnormal bone and marrow density with areas of relatively increased sclerosis and osteopenia. Findings may represent aggressive osteopenia, or renal osteodystrophy. 5. Moderately large right pleural effusion without significant interval change compared 06/17/2016. 6. Nondisplaced fracture the posterior aspect of the right fifth rib. 7. Cardiomegaly. 8. Moderate pericardial effusion. 9. Numerous renal cysts of varying complexity bilaterally. Electronically Signed   By: Malachy MoanHeath  McCullough M.D.   On: 07/27/2016 10:22    Medical Decision Making  Fredia SorrowVictoria Vandenheuvel is a 25 y.o. female who presents with sx T-spine fractures, thrombosed LUA AVG, ESRD requiring hemodialysis, likely left side central venous occlusion, s/p CKT x 3   Based on review of [facility] at [facility], this LUA AVG has likely been occluded >2 weeks, thus is not a candidate for percutaneous thrombolysis.    Additionally, it appears there was already concerns for central vein compromise on the left side, which might be the etiology for the LUA AVG failure anyway.  Given the presence of right SCV pacer wire, any right arm access is likely to have limited patency.  Given [facility], again long-term patency may be compromised.  At this point, the priority would be management of the T-spine fractures.  Eventually evaluation for permanent access can be entertained.    Please reconsult us once the patient is medically stable.  At that point, evaluation for access options would  include: R arm and central venography, femoral vein cannulation and venography.   Leonides SakeBrian Chen, MD [facility] Office: [phone] Pager: [phone]  07/27/2016, 6:52 PM

## 2016-07-27 NOTE — Progress Notes (Addendum)
PROGRESS NOTE    [name] Small  PIR:[id number] DOB: 10/02/1991 DOA: 07/26/2016  PCP: Jeanann Lewandowsky, MD   Brief Narrative:  [name] Small is a 25 y.o. female with ESRD on hemodialysis noncompliant with dialysis, cardiomyopathy status post AICD placement as had recent stress test done in September 26 which showed EF of 12% with reversible ischemia, hypertension presents to the ER because of increasing upper back pain after a recent fall and shortness of breath. Labs show hyperkalemia. Patient states after a fall she was not able to walk due to pain and had not gone to her dialysis for at least 2 sessions. EKG shows some prominent T waves. On-call nephrologist has been consulted and patient is being taken for urgent dialysis. On exam patient complains of upper mid back pain and had come to the ER 2 days ago after a fall and x-rays at that time showed T8 compression fracture. Patient denies any incontinence of urine or bowels.   Subjective: Having back pain from neck to lower back along her spine.   Assessment & Plan:   Principal Problem:   Acute pulmonary edema / volume overload - due to missing dialysis 2 x as she was in pain  Active Problems:   Back pain - as a result of a fall in setting or osteoporosis - CT showed T7 fracture 18-19 %, subtle compression deformity of superior endplate of T8, probable acute/subacute fracture at T12  - pain control- will discuss with Ortho in regards to kyphoplasty vs conservative treatment  Secondary hyperparathyroidism - surgery consulted for parathyroidectomy    Hyperkalemia - due to missing dialysis- has resolved after dialysis    ESRD on dialysis  - appreciate nephrology eval  Clotted Graft - currently has L thigh dialysis cath- vascular surgery consulted     NICM (nonischemic cardiomyopathy) - EF 12-20 % - Hydralazine, Coreg, Imdur    Essential hypertension - currently controlled    DVT prophylaxis: heparin Code Status: Full  code Family Communication:  Disposition Plan: 2- 3 day hospital stay Consultants:   Nephrology  IR Procedures:    Antimicrobials:  Anti-infectives    None     Objective: Vitals:   07/27/16 1215 07/27/16 1220 07/27/16 1300 07/27/16 1330  BP: 110/66 110/60 (!) 101/59 (!) 104/47  Pulse: 64 80 (!) 102 (!) 101  Resp: 18     Temp: 98.3 F (36.8 C)     TempSrc: Oral     SpO2: 98%     Weight: 57.6 kg (126 lb 15.8 oz)     Height:        Intake/Output Summary (Last 24 hours) at 07/27/16 1513 Last data filed at 07/27/16 1000  Gross per 24 hour  Intake               50 ml  Output             3500 ml  Net            -3450 ml   Filed Weights   07/26/16 1746 07/27/16 0110 07/27/16 1215  Weight: 52 kg (114 lb 10.2 oz) 52.6 kg (115 lb 15.4 oz) 57.6 kg (126 lb 15.8 oz)    Examination: General exam: Appears uncomfortable due to pain HEENT: PERRLA, oral mucosa moist, no sclera icterus or thrush Respiratory system: Clear to auscultation. Respiratory effort normal. Cardiovascular system: S1 & S2 heard, RRR.  No murmurs  Gastrointestinal system: Abdomen soft, non-tender, nondistended. Normal bowel sound. No organomegaly Central nervous system: Alert  and oriented. No focal neurological deficits. Extremities: No cyanosis, clubbing or edema Skin: No rashes or ulcers Psychiatry:  Mood & affect appropriate.     Data Reviewed: I have personally reviewed following labs and imaging studies  CBC:  Recent Labs Lab 07/26/16 1803 07/26/16 1814 07/27/16 1140 07/27/16 1230  WBC 7.4  --  5.9 6.0  NEUTROABS 5.8  --   --   --   HGB 11.4* 13.6 10.2* 10.1*  HCT 35.0* 40.0 31.8* 31.9*  MCV 89.1  --  88.3 88.1  PLT 219  --  138* 128*   Basic Metabolic Panel:  Recent Labs Lab 07/26/16 1803 07/26/16 1814 07/26/16 2201 07/27/16 1140 07/27/16 1230  NA 137 135 137 138 138  K 7.3* 6.9* 4.9 5.3* 5.2*  CL 99* 105 99* 100* 99*  CO2 16*  --  21* 21* 21*  GLUCOSE 96 96 104* 95 97  BUN  148* 138* 98* 85* 86*  CREATININE 14.96* 14.10* 10.67* 10.79* 10.34*  CALCIUM 9.9  --  9.9 10.0 10.1  MG 2.4  --   --   --   --   PHOS  --   --  8.5*  --  9.0*   GFR: Estimated Creatinine Clearance: 6.6 mL/min (by C-G formula based on SCr of 10.34 mg/dL (H)). Liver Function Tests:  Recent Labs Lab 07/26/16 1803 07/26/16 2201 07/27/16 1230  AST 19  --   --   ALT 8*  --   --   ALKPHOS 282*  --   --   BILITOT 1.1  --   --   PROT 7.9  --   --   ALBUMIN 3.5 3.7 3.0*   No results for input(s): LIPASE, AMYLASE in the last 168 hours. No results for input(s): AMMONIA in the last 168 hours. Coagulation Profile: No results for input(s): INR, PROTIME in the last 168 hours. Cardiac Enzymes: No results for input(s): CKTOTAL, CKMB, CKMBINDEX, TROPONINI in the last 168 hours. BNP (last 3 results) No results for input(s): PROBNP in the last 8760 hours. HbA1C: No results for input(s): HGBA1C in the last 72 hours. CBG: No results for input(s): GLUCAP in the last 168 hours. Lipid Profile: No results for input(s): CHOL, HDL, LDLCALC, TRIG, CHOLHDL, LDLDIRECT in the last 72 hours. Thyroid Function Tests: No results for input(s): TSH, T4TOTAL, FREET4, T3FREE, THYROIDAB in the last 72 hours. Anemia Panel: No results for input(s): VITAMINB12, FOLATE, FERRITIN, TIBC, IRON, RETICCTPCT in the last 72 hours. Urine analysis:    Component Value Date/Time   COLORURINE YELLOW 01/14/2013 1639   APPEARANCEUR CLOUDY (A) 01/14/2013 1639   LABSPEC 1.008 01/14/2013 1639   PHURINE 8.0 01/14/2013 1639   GLUCOSEU NEGATIVE 01/14/2013 1639   HGBUR SMALL (A) 01/14/2013 1639   BILIRUBINUR NEGATIVE 01/14/2013 1639   KETONESUR NEGATIVE 01/14/2013 1639   PROTEINUR 30 (A) 01/14/2013 1639   UROBILINOGEN 0.2 01/14/2013 1639   NITRITE NEGATIVE 01/14/2013 1639   LEUKOCYTESUR MODERATE (A) 01/14/2013 1639   Sepsis Labs: @LABRCNTIP (procalcitonin:4,lacticidven:4) ) Recent Results (from the past 240 hour(s))  MRSA  PCR Screening     Status: None   Collection Time: 07/27/16  8:07 AM  Result Value Ref Range Status   MRSA by PCR NEGATIVE NEGATIVE Final    Comment:        The GeneXpert MRSA Assay (FDA approved for NASAL specimens only), is one component of a comprehensive MRSA colonization surveillance program. It is not intended to diagnose MRSA infection nor to guide or  monitor treatment for MRSA infections.          Radiology Studies: Dg Chest 2 View  Result Date: 07/26/2016 CLINICAL DATA:  Shortness of breath and chest pain with history of recent rib fractures EXAM: CHEST  2 VIEW COMPARISON:  07/23/2016 FINDINGS: Cardiac shadow remains enlarged. A defibrillator is again seen. The lungs are well aerated bilaterally. Previously seen left basilar changes have improved in the interval from the prior exam. No sizable effusion is seen. No acute bony abnormality is noted. IMPRESSION: Improved aeration in the left lung base when compare with the prior exam. No new focal abnormality is seen. Electronically Signed   By: Alcide Clever M.D.   On: 07/26/2016 18:53   Ct Thoracic Spine Wo Contrast  Result Date: 07/27/2016 CLINICAL DATA:  25 year old female with progressive upper back pain and shortness of breath after falling. EXAM: CT THORACIC SPINE WITHOUT CONTRAST TECHNIQUE: Multidetector CT imaging of the thoracic spine was performed without intravenous contrast administration. Multiplanar CT image reconstructions were also generated. COMPARISON:  Prior chest CT 06/17/2016. Prior CT scan of the abdomen and pelvis 10/26/2015 FINDINGS: Alignment: Slightly exaggerated thoracic kyphosis. No scoliotic curvature. Vertebrae: Acute nondisplaced fracture through the posterior aspect of the right fifth rib. Subtle compression fracture of the anterior aspect of T7 with buckling of the cortex. There is approximately 18- 19% height loss anteriorly. No posterior retropulsion. Findings are new compared to prior chest CT  06/17/2016. Very subtle probable compression deformity of the superior endplate of T8 without height loss. Additionally, there is subtle buckling of the anterior cortex at T12 which is less specific but new compared to October 26, 2015. No associated height loss. Diffuse demineralization of the bones suggests osteoporosis. Paraspinal and other soft tissues: No paraspinal mass or hematoma. Disc levels: Moderately large right pleural effusion. Incompletely imaged right subclavian approach intracardiac defibrillator. Cardiomegaly. Moderate pericardial effusion. Incompletely imaged IVC filter. Numerous bilateral renal cysts and calcifications of varying complexity consistent with the clinical history of polycystic kidney disease. IMPRESSION: 1. Acute to subacute fracture of T7 with 18-19% anterior height loss. No evidence of posterior retropulsion. 2. Suspect subtle compression deformity of the superior endplate of T8 without associated height loss. 3. Similarly, probable acute to subacute fracture involving the anterior cortex of T12 without evidence of associated height loss. This finding is new compared to October 26, 2015 and has likely occurred at some point since that time. 4. Diffusely abnormal bone and marrow density with areas of relatively increased sclerosis and osteopenia. Findings may represent aggressive osteopenia, or renal osteodystrophy. 5. Moderately large right pleural effusion without significant interval change compared 06/17/2016. 6. Nondisplaced fracture the posterior aspect of the right fifth rib. 7. Cardiomegaly. 8. Moderate pericardial effusion. 9. Numerous renal cysts of varying complexity bilaterally. Electronically Signed   By: Malachy Moan M.D.   On: 07/27/2016 10:22      Scheduled Meds: . HYDROmorphone      . ondansetron      . [START ON 07/28/2016] calcitRIOL  0.5 mcg Oral Q M,W,F-HD  . carvedilol  12.5 mg Oral BID WC  . cinacalcet  30 mg Oral BID WC  . heparin  5,000 Units  Subcutaneous Q8H  . hydrALAZINE  25 mg Oral TID  . hydrocerin   Topical BID  . isosorbide dinitrate  10 mg Oral TID  . multivitamin  1 tablet Oral QHS  . oxyCODONE  5 mg Oral QID  . sertraline  100 mg Oral Daily  . sevelamer  carbonate  2,400 mg Oral TID WC   Continuous Infusions:    LOS: 1 day    Time spent in minutes: 35    Kavion Mancinas, MD [facility] Pager: [email or web address] Password [email or web address] 07/27/2016, 3:13 PM

## 2016-07-27 NOTE — Progress Notes (Signed)
Patient refused heparin sq injection. Educated on the importance of anticoagulation therapy. Patient stated that she understood but still refused.

## 2016-07-27 NOTE — Procedures (Signed)
  I was present at this dialysis session, have reviewed the session itself and made  appropriate changes Vinson Moselle MD [facility] pager [phone]   07/27/2016, 2:15 PM

## 2016-07-27 NOTE — Progress Notes (Signed)
[facility] Progress Note   Subjective:  Admitted overnight and required urgent dialysis for volume overload and hyperkalemia (K 7.3). She had missed 2 sessions of HD prior to admit for severe back pain s/p fall. X-ray after fall showed T8 compression fracture. Currently, she denies overt dyspnea, but clearly has volume excess. Denies chest pain at this time, but severe back pain with minimal movements.  Objective Vitals:   07/27/16 0021 07/27/16 0025 07/27/16 0110 07/27/16 0543  BP: (!) 125/98 (!) 132/94 (!) 146/99 130/89  Pulse: 97 97 (!) 104 (!) 104  Resp: 19  20 19   Temp: 97.8 F (36.6 C)  98.5 F (36.9 C) 98.6 F (37 C)  TempSrc:   Oral Oral  SpO2:   98% 97%  Weight:   52.6 kg (115 lb 15.4 oz)   Height:   5\' 2"  (1.575 m)    Physical Exam General: Young, pleasant female. NAD, facial edema present. Heart: S3 present. RRR; no murmur Lungs: Unable to sit up due to back pain. Lungs clear anteriorly. dec'd BS post bases, poor inspiration Extremities: 3+ LE edema to thigh level. 2+ edema pretib bilat, thigh edema 1+ Dialysis Access: Femoral perm-cath. Also with recently clotted LUE AVF, unclear if salvageable.  Dialysis Orders: [facility] on MWF, but changing to [facility]. 4 hours, EDW 54, 2K/2Ca bath, 180 dialyzer, BFR 400/DFR 800 - Heparin 2000 unit bolus - Calcitriol 0.9mcg PO q HD (last Ca 9.6, Phos 6.7, PTH 4556) - No ESA   Additional Objective Labs: Basic Metabolic Panel:  Recent Labs Lab 07/26/16 1803 07/26/16 1814 07/26/16 2201  NA 137 135 137  K 7.3* 6.9* 4.9  CL 99* 105 99*  CO2 16*  --  21*  GLUCOSE 96 96 104*  BUN 148* 138* 98*  CREATININE 14.96* 14.10* 10.67*  CALCIUM 9.9  --  9.9  PHOS  --   --  8.5*   Liver Function Tests:  Recent Labs Lab 07/26/16 1803 07/26/16 2201  AST 19  --   ALT 8*  --   ALKPHOS 282*  --   BILITOT 1.1  --   PROT 7.9  --   ALBUMIN 3.5 3.7   CBC:  Recent Labs Lab 07/26/16 1803 07/26/16 1814   WBC 7.4  --   NEUTROABS 5.8  --   HGB 11.4* 13.6  HCT 35.0* 40.0  MCV 89.1  --   PLT 219  --    Studies/Results: Dg Chest 2 View  Result Date: 07/26/2016 CLINICAL DATA:  Shortness of breath and chest pain with history of recent rib fractures EXAM: CHEST  2 VIEW COMPARISON:  07/23/2016 FINDINGS: Cardiac shadow remains enlarged. A defibrillator is again seen. The lungs are well aerated bilaterally. Previously seen left basilar changes have improved in the interval from the prior exam. No sizable effusion is seen. No acute bony abnormality is noted. IMPRESSION: Improved aeration in the left lung base when compare with the prior exam. No new focal abnormality is seen. Electronically Signed   By: Alcide Clever M.D.   On: 07/26/2016 18:53   Medications:   . [START ON 07/28/2016] calcitRIOL  0.5 mcg Oral Q M,W,F-HD  . carvedilol  12.5 mg Oral BID WC  . cinacalcet  30 mg Oral BID WC  . heparin  5,000 Units Subcutaneous Q8H  . hydrALAZINE  25 mg Oral TID  . hydrocerin   Topical BID  . isosorbide dinitrate  10 mg Oral TID  . multivitamin  1  tablet Oral QHS  . oxyCODONE  5 mg Oral QID  . sertraline  100 mg Oral Daily  . sevelamer carbonate  2,400 mg Oral TID WC    Assessment/Plan: 1. Hyperkalemia: K 7.3 on admit, resolved with urgent dialysis 10/4. 2. Severe back pain s/p fall with fracture: Significant pain and unable to walk; work-up including spinal CT pending. 3. 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 4. HTN/volume: BP ok, but severe volume overload. As above, daily dialysis until corrected. 5. Anemia: Last Hgb 13.6; no ESA needed at this time. 6. Secondary hyperparathyroidism: Severely uncontrolled with last PTH > 4500; continue current binders/VDRA/Sensipar dose for now and will adjust doses once taking meds consistently. She is asking about parathyroidectomy, will likely need one. Dr. Gerrit FriendsGerkin has been consulted. 7. Nutrition: Albumin  3.7; continue high protein diet. 8. Non-ischemic cardiomyopathy (s/p AICD; EF 12-20%): Per primary. 9. AVF issues: VVS consulted for ?recently clotted AVF, hopefully salvageable.   Ozzie HoyleKatie Stovall, PA-C 07/27/2016, 10:24 AM  [facility] Pager: [phone]  Pt seen, examined, agree w assess/plan as above with additions as indicated. ESRD pt with vol overload and hyperkalemia, had HD last night and now on HD again today.  Has multiple fractures of spine (compress fx's) which are likely due to her CKD related bone disease. She has hx of PTH levels >3000.  Also has a NICM.  Plan is for serial HD until vol overload resolved.  Asked gen surg to see for possible PTX.   Vinson Moselleob Lynnann Knudsen MD [facility] pager [phone]    cell [phone] 07/27/2016, 2:13 PM

## 2016-07-28 ENCOUNTER — Encounter (HOSPITAL_COMMUNITY): Payer: Medicare Other

## 2016-07-28 DIAGNOSIS — Z95810 Presence of automatic (implantable) cardiac defibrillator: Secondary | ICD-10-CM

## 2016-07-28 DIAGNOSIS — I428 Other cardiomyopathies: Secondary | ICD-10-CM

## 2016-07-28 DIAGNOSIS — S22000A Wedge compression fracture of unspecified thoracic vertebra, initial encounter for closed fracture: Secondary | ICD-10-CM

## 2016-07-28 LAB — CBC
HEMATOCRIT: 32.8 % — AB (ref 36.0–46.0)
HEMOGLOBIN: 10.2 g/dL — AB (ref 12.0–15.0)
MCH: 27.7 pg (ref 26.0–34.0)
MCHC: 31.1 g/dL (ref 30.0–36.0)
MCV: 89.1 fL (ref 78.0–100.0)
Platelets: 116 10*3/uL — ABNORMAL LOW (ref 150–400)
RBC: 3.68 MIL/uL — AB (ref 3.87–5.11)
RDW: 18.8 % — ABNORMAL HIGH (ref 11.5–15.5)
WBC: 6 10*3/uL (ref 4.0–10.5)

## 2016-07-28 LAB — RENAL FUNCTION PANEL
ALBUMIN: 3 g/dL — AB (ref 3.5–5.0)
ANION GAP: 18 — AB (ref 5–15)
BUN: 38 mg/dL — ABNORMAL HIGH (ref 6–20)
CALCIUM: 10.2 mg/dL (ref 8.9–10.3)
CO2: 26 mmol/L (ref 22–32)
Chloride: 95 mmol/L — ABNORMAL LOW (ref 101–111)
Creatinine, Ser: 6.2 mg/dL — ABNORMAL HIGH (ref 0.44–1.00)
GFR calc non Af Amer: 9 mL/min — ABNORMAL LOW (ref >60)
GFR, EST AFRICAN AMERICAN: 10 mL/min — AB (ref >60)
GLUCOSE: 89 mg/dL (ref 65–99)
PHOSPHORUS: 7.4 mg/dL — AB (ref 2.5–4.6)
POTASSIUM: 4 mmol/L (ref 3.5–5.1)
Sodium: 139 mmol/L (ref 135–145)

## 2016-07-28 MED ORDER — NAPROXEN 250 MG PO TABS
500.0000 mg | ORAL_TABLET | Freq: Two times a day (BID) | ORAL | Status: DC
  Administered 2016-07-28 – 2016-08-04 (×12): 500 mg via ORAL
  Filled 2016-07-28 (×2): qty 2
  Filled 2016-07-28 (×5): qty 1
  Filled 2016-07-28: qty 2
  Filled 2016-07-28: qty 1
  Filled 2016-07-28 (×2): qty 2
  Filled 2016-07-28: qty 1
  Filled 2016-07-28: qty 2
  Filled 2016-07-28: qty 1
  Filled 2016-07-28: qty 2
  Filled 2016-07-28: qty 1

## 2016-07-28 MED ORDER — OXYCODONE HCL 5 MG PO TABS
10.0000 mg | ORAL_TABLET | Freq: Four times a day (QID) | ORAL | Status: DC
  Administered 2016-07-28 (×3): 10 mg via ORAL
  Filled 2016-07-28 (×3): qty 2

## 2016-07-28 MED ORDER — HYDRALAZINE HCL 10 MG PO TABS
10.0000 mg | ORAL_TABLET | Freq: Three times a day (TID) | ORAL | Status: DC
  Administered 2016-07-28: 10 mg via ORAL
  Filled 2016-07-28: qty 1

## 2016-07-28 MED ORDER — CALCITRIOL 0.25 MCG PO CAPS
ORAL_CAPSULE | ORAL | Status: AC
  Filled 2016-07-28: qty 2

## 2016-07-28 MED ORDER — ALUM & MAG HYDROXIDE-SIMETH 200-200-20 MG/5ML PO SUSP
30.0000 mL | Freq: Once | ORAL | Status: AC
  Administered 2016-07-28: 30 mL via ORAL
  Filled 2016-07-28: qty 30

## 2016-07-28 MED ORDER — HYDROMORPHONE HCL 1 MG/ML IJ SOLN
INTRAMUSCULAR | Status: AC
  Filled 2016-07-28: qty 1

## 2016-07-28 NOTE — Progress Notes (Signed)
PROGRESS NOTE    [name] Small  ZOX:[id number] DOB: 11-Nov-1990 DOA: 07/26/2016  PCP: Jeanann Lewandowsky, MD   Brief Narrative:  [name] Small is a 25 y.o. female with ESRD on hemodialysis noncompliant with dialysis, cardiomyopathy status post AICD placement as had recent stress test done in September 26 which showed EF of 12% with reversible ischemia, hypertension presents to the ER because of increasing upper back pain after a recent fall and shortness of breath. Labs show hyperkalemia. Patient states after a fall she was not able to walk due to pain and had not gone to her dialysis for at least 2 sessions. EKG shows some prominent T waves. On-call nephrologist has been consulted and patient is being taken for urgent dialysis. On exam patient complains of upper mid back pain and had come to the ER 2 days ago after a fall and x-rays at that time showed T8 compression fracture. Patient denies any incontinence of urine or bowels.   Subjective: States that her back pain has been a 7/10. No new complaints.   Assessment & Plan:   Principal Problem:   Acute pulmonary edema / volume overload/   ESRD on dialysis  - due to missing dialysis 2 x as she was in pain - Nephrology managing - dialysis at this point-    Active Problems:   Back pain - as a result of a fall in setting or osteoporosis - CT showed T7 fracture 18-19 %, subtle compression deformity of superior endplate of T8, probable acute/subacute fracture at T12  - pain control- will increase Percocet to 2 tabs QID and add Naproxen with hope to taper off Dilaudid - I spoke with Dr Shon Baton, Ortho, who recommends conservative management for now with brace and pain rather than a kyphoplasty - if her hyperparathyroidism cannot be resolved via surgery, he will consider a more permanent solution with a kyphoplasty  Secondary hyperparathyroidism - surgery consulted for parathyroidectomy    Hyperkalemia - due to missing dialysis- has resolved  after dialysis  Clotted Graft in LUE - currently has L thigh tunneled dialysis cath- vascular surgery consulted      NICM (nonischemic cardiomyopathy) - EF 12-20 % - Hydralazine, Coreg, Imdur    Essential hypertension - currently controlled    DVT prophylaxis: heparin Code Status: Full code Family Communication:  Disposition Plan: 2- 3 day hospital stay Consultants:   Nephrology  IR Procedures:    Antimicrobials:  Anti-infectives    None     Objective: Vitals:   07/28/16 1015 07/28/16 1030 07/28/16 1050 07/28/16 1136  BP: (!) 115/55 (!) 118/55 103/61 102/81  Pulse: 100 (!) 102 97 96  Resp:   20 18  Temp:   98.1 F (36.7 C) 98.7 F (37.1 C)  TempSrc:   Oral Oral  SpO2:   98% 96%  Weight:   47.7 kg (105 lb 2.6 oz)   Height:        Intake/Output Summary (Last 24 hours) at 07/28/16 1227 Last data filed at 07/28/16 1136  Gross per 24 hour  Intake              480 ml  Output             7482 ml  Net            -7002 ml   Filed Weights   07/27/16 2019 07/28/16 0640 07/28/16 1050  Weight: 53.4 kg (117 lb 11.6 oz) 51.8 kg (114 lb 3.2 oz) 47.7 kg (105 lb 2.6  oz)    Examination: General exam: Appears uncomfortable due to pain HEENT: PERRLA, oral mucosa moist, no sclera icterus or thrush Respiratory system: Clear to auscultation. Respiratory effort normal. Cardiovascular system: S1 & S2 heard, RRR.  No murmurs  Gastrointestinal system: Abdomen soft, non-tender, nondistended. Normal bowel sound. No organomegaly Central nervous system: Alert and oriented. No focal neurological deficits. Extremities: No cyanosis, clubbing or edema Skin: No rashes or ulcers Psychiatry:  Mood & affect appropriate.     Data Reviewed: I have personally reviewed following labs and imaging studies  CBC:  Recent Labs Lab 07/26/16 1803 07/26/16 1814 07/27/16 1140 07/27/16 1230 07/28/16 0451  WBC 7.4  --  5.9 6.0 6.0  NEUTROABS 5.8  --   --   --   --   HGB 11.4* 13.6 10.2*  10.1* 10.2*  HCT 35.0* 40.0 31.8* 31.9* 32.8*  MCV 89.1  --  88.3 88.1 89.1  PLT 219  --  138* 128* 116*   Basic Metabolic Panel:  Recent Labs Lab 07/26/16 1803 07/26/16 1814 07/26/16 2201 07/27/16 1140 07/27/16 1230 07/28/16 0451  NA 137 135 137 138 138 139  K 7.3* 6.9* 4.9 5.3* 5.2* 4.0  CL 99* 105 99* 100* 99* 95*  CO2 16*  --  21* 21* 21* 26  GLUCOSE 96 96 104* 95 97 89  BUN 148* 138* 98* 85* 86* 38*  CREATININE 14.96* 14.10* 10.67* 10.79* 10.34* 6.20*  CALCIUM 9.9  --  9.9 10.0 10.1 10.2  MG 2.4  --   --   --   --   --   PHOS  --   --  8.5*  --  9.0* 7.4*   GFR: Estimated Creatinine Clearance: 10.4 mL/min (by C-G formula based on SCr of 6.2 mg/dL (H)). Liver Function Tests:  Recent Labs Lab 07/26/16 1803 07/26/16 2201 07/27/16 1230 07/28/16 0451  AST 19  --   --   --   ALT 8*  --   --   --   ALKPHOS 282*  --   --   --   BILITOT 1.1  --   --   --   PROT 7.9  --   --   --   ALBUMIN 3.5 3.7 3.0* 3.0*   No results for input(s): LIPASE, AMYLASE in the last 168 hours. No results for input(s): AMMONIA in the last 168 hours. Coagulation Profile: No results for input(s): INR, PROTIME in the last 168 hours. Cardiac Enzymes: No results for input(s): CKTOTAL, CKMB, CKMBINDEX, TROPONINI in the last 168 hours. BNP (last 3 results) No results for input(s): PROBNP in the last 8760 hours. HbA1C: No results for input(s): HGBA1C in the last 72 hours. CBG: No results for input(s): GLUCAP in the last 168 hours. Lipid Profile: No results for input(s): CHOL, HDL, LDLCALC, TRIG, CHOLHDL, LDLDIRECT in the last 72 hours. Thyroid Function Tests: No results for input(s): TSH, T4TOTAL, FREET4, T3FREE, THYROIDAB in the last 72 hours. Anemia Panel: No results for input(s): VITAMINB12, FOLATE, FERRITIN, TIBC, IRON, RETICCTPCT in the last 72 hours. Urine analysis:    Component Value Date/Time   COLORURINE YELLOW 01/14/2013 1639   APPEARANCEUR CLOUDY (A) 01/14/2013 1639   LABSPEC  1.008 01/14/2013 1639   PHURINE 8.0 01/14/2013 1639   GLUCOSEU NEGATIVE 01/14/2013 1639   HGBUR SMALL (A) 01/14/2013 1639   BILIRUBINUR NEGATIVE 01/14/2013 1639   KETONESUR NEGATIVE 01/14/2013 1639   PROTEINUR 30 (A) 01/14/2013 1639   UROBILINOGEN 0.2 01/14/2013 1639   NITRITE  NEGATIVE 01/14/2013 1639   LEUKOCYTESUR MODERATE (A) 01/14/2013 1639   Sepsis Labs: @LABRCNTIP (procalcitonin:4,lacticidven:4) ) Recent Results (from the past 240 hour(s))  MRSA PCR Screening     Status: None   Collection Time: 07/27/16  8:07 AM  Result Value Ref Range Status   MRSA by PCR NEGATIVE NEGATIVE Final    Comment:        The GeneXpert MRSA Assay (FDA approved for NASAL specimens only), is one component of a comprehensive MRSA colonization surveillance program. It is not intended to diagnose MRSA infection nor to guide or monitor treatment for MRSA infections.          Radiology Studies: Dg Chest 2 View  Result Date: 07/26/2016 CLINICAL DATA:  Shortness of breath and chest pain with history of recent rib fractures EXAM: CHEST  2 VIEW COMPARISON:  07/23/2016 FINDINGS: Cardiac shadow remains enlarged. A defibrillator is again seen. The lungs are well aerated bilaterally. Previously seen left basilar changes have improved in the interval from the prior exam. No sizable effusion is seen. No acute bony abnormality is noted. IMPRESSION: Improved aeration in the left lung base when compare with the prior exam. No new focal abnormality is seen. Electronically Signed   By: Alcide CleverMark  Lukens M.D.   On: 07/26/2016 18:53   Ct Thoracic Spine Wo Contrast  Result Date: 07/27/2016 CLINICAL DATA:  25 year old female with progressive upper back pain and shortness of breath after falling. EXAM: CT THORACIC SPINE WITHOUT CONTRAST TECHNIQUE: Multidetector CT imaging of the thoracic spine was performed without intravenous contrast administration. Multiplanar CT image reconstructions were also generated. COMPARISON:   Prior chest CT 06/17/2016. Prior CT scan of the abdomen and pelvis 10/26/2015 FINDINGS: Alignment: Slightly exaggerated thoracic kyphosis. No scoliotic curvature. Vertebrae: Acute nondisplaced fracture through the posterior aspect of the right fifth rib. Subtle compression fracture of the anterior aspect of T7 with buckling of the cortex. There is approximately 18- 19% height loss anteriorly. No posterior retropulsion. Findings are new compared to prior chest CT 06/17/2016. Very subtle probable compression deformity of the superior endplate of T8 without height loss. Additionally, there is subtle buckling of the anterior cortex at T12 which is less specific but new compared to October 26, 2015. No associated height loss. Diffuse demineralization of the bones suggests osteoporosis. Paraspinal and other soft tissues: No paraspinal mass or hematoma. Disc levels: Moderately large right pleural effusion. Incompletely imaged right subclavian approach intracardiac defibrillator. Cardiomegaly. Moderate pericardial effusion. Incompletely imaged IVC filter. Numerous bilateral renal cysts and calcifications of varying complexity consistent with the clinical history of polycystic kidney disease. IMPRESSION: 1. Acute to subacute fracture of T7 with 18-19% anterior height loss. No evidence of posterior retropulsion. 2. Suspect subtle compression deformity of the superior endplate of T8 without associated height loss. 3. Similarly, probable acute to subacute fracture involving the anterior cortex of T12 without evidence of associated height loss. This finding is new compared to October 26, 2015 and has likely occurred at some point since that time. 4. Diffusely abnormal bone and marrow density with areas of relatively increased sclerosis and osteopenia. Findings may represent aggressive osteopenia, or renal osteodystrophy. 5. Moderately large right pleural effusion without significant interval change compared 06/17/2016. 6.  Nondisplaced fracture the posterior aspect of the right fifth rib. 7. Cardiomegaly. 8. Moderate pericardial effusion. 9. Numerous renal cysts of varying complexity bilaterally. Electronically Signed   By: Malachy MoanHeath  McCullough M.D.   On: 07/27/2016 10:22      Scheduled Meds: . calcitRIOL  0.5  mcg Oral Q M,W,F-HD  . carvedilol  12.5 mg Oral BID WC  . cinacalcet  30 mg Oral BID WC  . heparin  5,000 Units Subcutaneous Q8H  . hydrALAZINE  25 mg Oral TID  . hydrocerin   Topical BID  . isosorbide dinitrate  10 mg Oral TID  . multivitamin  1 tablet Oral QHS  . naproxen  500 mg Oral BID WC  . oxyCODONE  10 mg Oral QID  . sertraline  100 mg Oral Daily  . sevelamer carbonate  2,400 mg Oral TID WC   Continuous Infusions:    LOS: 2 days    Time spent in minutes: 35    Journey Ratterman, MD [facility] Pager: [email or web address] Password [email or web address] 07/28/2016, 12:27 PM

## 2016-07-28 NOTE — Progress Notes (Signed)
Orthopedic Tech Progress Note Patient Details:  Elesha Wydra Sep 29, 1991 [id number]  Patient ID: [name], female   DOB: May 24, 1991, 25 y.o.   MRN: [id number]   [name] 07/28/2016, 9:15 AM Called in bio-tech brace order; spoke with [facility]

## 2016-07-28 NOTE — Progress Notes (Signed)
[facility] Progress Note   Subjective: feeling better, no SOB.  Bad back pain, has not been able to walk for since her fall w compression fractures   Objective Vitals:   07/28/16 1030 07/28/16 1050 07/28/16 1136 07/28/16 1244  BP: (!) 118/55 103/61 102/81 107/78  Pulse: (!) 102 97 96 97  Resp:  20 18 14   Temp:  98.1 F (36.7 C) 98.7 F (37.1 C) 98.5 F (36.9 C)  TempSrc:  Oral Oral Oral  SpO2:  98% 96% 100%  Weight:  47.7 kg (105 lb 2.6 oz)    Height:       Physical Exam General: Young, pleasant female. NAD, facial edema present. Heart: S3 present. RRR; no murmur Lungs: Unable to sit up due to back pain. Lungs clear anteriorly Extremities: 1+ pedal edema Dialysis Access: Femoral perm-cath. Also with recently clotted LUE AVF, unclear if salvageable.  Dialysis Orders: [facility] on MWF, but changing to [facility]. 4 hours, EDW 54, 2K/2Ca bath, 180 dialyzer, BFR 400/DFR 800 - Heparin 2000 unit bolus - Calcitriol 0.78mcg PO q HD (last Ca 9.6, Phos 6.7, PTH 4556) - No ESA   Assessment: 1. Hyperkalemia - resolved w HD 2. Severe back pain s/p fall with fracture: Significant pain and unable to walk.  Hx severe ^^PTH which correlates w severe bone weakening and propensity for fractures. 3. Vol overload - mostly resolved after HD today and yest.  Under dry wt, get Hoyer wt, cannot stand now  4. 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 Anemia: Last Hgb 13.6; no ESA needed at this time. 6. Secondary hyperparathyroidism: Severely uncontrolled with last PTH > 4500; continue current binders/VDRA/Sensipar dose for now and will adjust doses once taking meds consistently. She asked about parathyroidectomy. This is elective procedure, will refer to Gen Surg in OP setting 7. Nutrition: Albumin 3.7; continue high protein diet. 8. Non-ischemic cardiomyopathy (s/p AICD; EF 12-20%): Per primary. 9. AVF issues: [facility] consulted for recently clotted LUA AVG.  Per [facility] will need  new perm access when medically stable.  She is as stable now as she is going to be with vol /K+ issues resolved, have asked [facility] to do new perm access next week if possible.  10. HTN - BP's soft w vol down, will decrease BP meds, lower dry wt, get Hoyer weight  Plan - HD today, o/w as above   Additional Objective Labs: Basic Metabolic Panel:  Recent Labs Lab 07/26/16 2201 07/27/16 1140 07/27/16 1230 07/28/16 0451  NA 137 138 138 139  K 4.9 5.3* 5.2* 4.0  CL 99* 100* 99* 95*  CO2 21* 21* 21* 26  GLUCOSE 104* 95 97 89  BUN 98* 85* 86* 38*  CREATININE 10.67* 10.79* 10.34* 6.20*  CALCIUM 9.9 10.0 10.1 10.2  PHOS 8.5*  --  9.0* 7.4*   Liver Function Tests:  Recent Labs Lab 07/26/16 1803 07/26/16 2201 07/27/16 1230 07/28/16 0451  AST 19  --   --   --   ALT 8*  --   --   --   ALKPHOS 282*  --   --   --   BILITOT 1.1  --   --   --   PROT 7.9  --   --   --   ALBUMIN 3.5 3.7 3.0* 3.0*   CBC:  Recent Labs Lab 07/26/16 1803  07/27/16 1140 07/27/16 1230 07/28/16 0451  WBC 7.4  --  5.9 6.0 6.0  NEUTROABS 5.8  --   --   --   --  HGB 11.4*  < > 10.2* 10.1* 10.2*  HCT 35.0*  < > 31.8* 31.9* 32.8*  MCV 89.1  --  88.3 88.1 89.1  PLT 219  --  138* 128* 116*  < > = values in this interval not displayed. Studies/Results: Dg Chest 2 View  Result Date: 07/26/2016 CLINICAL DATA:  Shortness of breath and chest pain with history of recent rib fractures EXAM: CHEST  2 VIEW COMPARISON:  07/23/2016 FINDINGS: Cardiac shadow remains enlarged. A defibrillator is again seen. The lungs are well aerated bilaterally. Previously seen left basilar changes have improved in the interval from the prior exam. No sizable effusion is seen. No acute bony abnormality is noted. IMPRESSION: Improved aeration in the left lung base when compare with the prior exam. No new focal abnormality is seen. Electronically Signed   By: Alcide CleverMark  Lukens M.D.   On: 07/26/2016 18:53   Ct Thoracic Spine Wo  Contrast  Result Date: 07/27/2016 CLINICAL DATA:  25 year old female with progressive upper back pain and shortness of breath after falling. EXAM: CT THORACIC SPINE WITHOUT CONTRAST TECHNIQUE: Multidetector CT imaging of the thoracic spine was performed without intravenous contrast administration. Multiplanar CT image reconstructions were also generated. COMPARISON:  Prior chest CT 06/17/2016. Prior CT scan of the abdomen and pelvis 10/26/2015 FINDINGS: Alignment: Slightly exaggerated thoracic kyphosis. No scoliotic curvature. Vertebrae: Acute nondisplaced fracture through the posterior aspect of the right fifth rib. Subtle compression fracture of the anterior aspect of T7 with buckling of the cortex. There is approximately 18- 19% height loss anteriorly. No posterior retropulsion. Findings are new compared to prior chest CT 06/17/2016. Very subtle probable compression deformity of the superior endplate of T8 without height loss. Additionally, there is subtle buckling of the anterior cortex at T12 which is less specific but new compared to October 26, 2015. No associated height loss. Diffuse demineralization of the bones suggests osteoporosis. Paraspinal and other soft tissues: No paraspinal mass or hematoma. Disc levels: Moderately large right pleural effusion. Incompletely imaged right subclavian approach intracardiac defibrillator. Cardiomegaly. Moderate pericardial effusion. Incompletely imaged IVC filter. Numerous bilateral renal cysts and calcifications of varying complexity consistent with the clinical history of polycystic kidney disease. IMPRESSION: 1. Acute to subacute fracture of T7 with 18-19% anterior height loss. No evidence of posterior retropulsion. 2. Suspect subtle compression deformity of the superior endplate of T8 without associated height loss. 3. Similarly, probable acute to subacute fracture involving the anterior cortex of T12 without evidence of associated height loss. This finding is new  compared to October 26, 2015 and has likely occurred at some point since that time. 4. Diffusely abnormal bone and marrow density with areas of relatively increased sclerosis and osteopenia. Findings may represent aggressive osteopenia, or renal osteodystrophy. 5. Moderately large right pleural effusion without significant interval change compared 06/17/2016. 6. Nondisplaced fracture the posterior aspect of the right fifth rib. 7. Cardiomegaly. 8. Moderate pericardial effusion. 9. Numerous renal cysts of varying complexity bilaterally. Electronically Signed   By: Malachy MoanHeath  McCullough M.D.   On: 07/27/2016 10:22   Medications:   . calcitRIOL  0.5 mcg Oral Q M,W,F-HD  . carvedilol  12.5 mg Oral BID WC  . cinacalcet  30 mg Oral BID WC  . heparin  5,000 Units Subcutaneous Q8H  . hydrALAZINE  25 mg Oral TID  . hydrocerin   Topical BID  . isosorbide dinitrate  10 mg Oral TID  . multivitamin  1 tablet Oral QHS  . naproxen  500 mg Oral BID  WC  . oxyCODONE  10 mg Oral QID  . sertraline  100 mg Oral Daily  . sevelamer carbonate  2,400 mg Oral TID WC

## 2016-07-28 NOTE — Evaluation (Signed)
Physical Therapy Evaluation Patient Details Name: [name] Small MRN: [id number] DOB: September 26, 1991 Today's Date: 07/28/2016   History of Present Illness  Patient is a 25 yo female admitted 07/26/16 after a fall resulting in T7, T8, T12 fractures.  Ortho - conservative treatment with TLSO.  Patient also with acute pulmonary edema, hyperkalemia (missed HD), and has been non-ambulatory since fall 07/22/16.     PMH:  ESRD on HD, NICM, EF 12-20%, AICD, HTN, anemia, CHF, sickle cell    Clinical Impression  Patient presents with problems listed below.  Will benefit from acute PT to maximize functional mobility prior to discharge.  Pain limiting session today.  Will coordinate with nursing to premedicate patient for next session.    Follow Up Recommendations Home health PT;Supervision for mobility/OOB    Equipment Recommendations  3in1 (PT)    Recommendations for Other Services       Precautions / Restrictions Precautions Precautions: Fall;Back Precaution Comments: Reviewed back precautions for comfort/ease of movement Required Braces or Orthoses: Spinal Brace Spinal Brace: Thoracolumbosacral orthotic Restrictions Weight Bearing Restrictions: No      Mobility  Bed Mobility Overal bed mobility: Needs Assistance Bed Mobility: Rolling;Sidelying to Sit Rolling: Min assist Sidelying to sit: Mod assist       General bed mobility comments: Verbal cues for technique to minimize pain.  Patient able to reach with UE to rail and bend LE's to log roll to side.  Assist to initiate and complete roll.  Patient attempted to move sidelying to sit x3 with mod assist.  Patient reporting pain increasing to 10/10 and unable to reach fully sitting position.  Transfers                 General transfer comment: Unable due to pain  Ambulation/Gait                Stairs            Wheelchair Mobility    Modified Rankin (Stroke Patients Only)       Balance                                              Pertinent Vitals/Pain Pain Assessment: 0-10 Pain Score: 10-Worst pain ever (7 at rest; 10 with movement) Pain Location: back and shoulders Pain Descriptors / Indicators: Aching;Guarding;Sharp;Shooting Pain Intervention(s): Limited activity within patient's tolerance;Monitored during session;Repositioned;Patient requesting pain meds-RN notified    Home Living Family/patient expects to be discharged to:: Private residence Living Arrangements: Spouse/significant other (Boyfriend) Available Help at Discharge: Family;Available PRN/intermittently (Boyfriend works; Parents in area) Type of Home: Apartment Home Access: Level entry     Home Layout: One level Home Equipment: Environmental consultantWalker - 2 wheels      Prior Function Level of Independence: Independent (Prior to fall)         Comments: Patient uses public transportation to HD.  Does drive.     Hand Dominance        Extremity/Trunk Assessment   Upper Extremity Assessment: Generalized weakness (Pain limiting testing)           Lower Extremity Assessment: Generalized weakness (Difficult to assess due to pain)         Communication   Communication: No difficulties  Cognition Arousal/Alertness: Awake/alert Behavior During Therapy: WFL for tasks assessed/performed;Anxious Overall Cognitive Status: Within Functional Limits for tasks assessed  General Comments      Exercises     Assessment/Plan    PT Assessment Patient needs continued PT services  PT Problem List Decreased strength;Decreased activity tolerance;Decreased balance;Decreased mobility;Decreased knowledge of use of DME;Decreased knowledge of precautions;Pain          PT Treatment Interventions DME instruction;Gait training;Functional mobility training;Therapeutic activities;Patient/family education    PT Goals (Current goals can be found in the Care Plan section)  Acute Rehab PT  Goals Patient Stated Goal: To decrease pain PT Goal Formulation: With patient Time For Goal Achievement: 08/04/16 Potential to Achieve Goals: Good    Frequency Min 5X/week   Barriers to discharge Decreased caregiver support Patient at home alone during day.    Co-evaluation               End of Session   Activity Tolerance: Patient limited by pain Patient left: in bed;with call bell/phone within reach Nurse Communication: Mobility status;Patient requests pain meds         Time: 1705-1731 PT Time Calculation (min) (ACUTE ONLY): 26 min   Charges:   PT Evaluation $PT Eval Moderate Complexity: 1 Procedure PT Treatments $Therapeutic Activity: 8-22 mins   PT G Codes:        [name] 2016-08-04, 6:58 PM [name]. Renaldo Fiddler, [facility] Acute Rehab Services Pager [phone]

## 2016-07-29 DIAGNOSIS — S22000D Wedge compression fracture of unspecified thoracic vertebra, subsequent encounter for fracture with routine healing: Secondary | ICD-10-CM

## 2016-07-29 DIAGNOSIS — T8612 Kidney transplant failure: Secondary | ICD-10-CM

## 2016-07-29 MED ORDER — METHYLPREDNISOLONE 32 MG PO TABS
32.0000 mg | ORAL_TABLET | Freq: Once | ORAL | Status: AC
  Administered 2016-07-30: 32 mg via ORAL
  Filled 2016-07-29: qty 1

## 2016-07-29 MED ORDER — HYDROMORPHONE HCL 1 MG/ML IJ SOLN
1.0000 mg | Freq: Every day | INTRAMUSCULAR | Status: DC | PRN
  Administered 2016-07-29 – 2016-08-01 (×4): 1 mg via INTRAVENOUS
  Filled 2016-07-29 (×5): qty 1

## 2016-07-29 MED ORDER — POLYETHYLENE GLYCOL 3350 17 G PO PACK: 17.0000 g | PACK | Freq: Every day | ORAL | Status: DC | PRN

## 2016-07-29 MED ORDER — HYDROMORPHONE HCL 1 MG/ML IJ SOLN
0.5000 mg | INTRAMUSCULAR | Status: DC | PRN
  Administered 2016-07-29 – 2016-08-04 (×26): 0.5 mg via INTRAVENOUS
  Filled 2016-07-29 (×25): qty 1

## 2016-07-29 MED ORDER — OXYCODONE HCL 5 MG PO TABS
15.0000 mg | ORAL_TABLET | Freq: Four times a day (QID) | ORAL | Status: DC
  Administered 2016-07-29 – 2016-08-04 (×21): 15 mg via ORAL
  Filled 2016-07-29 (×19): qty 3

## 2016-07-29 MED ORDER — METHYLPREDNISOLONE 32 MG PO TABS
32.0000 mg | ORAL_TABLET | Freq: Once | ORAL | Status: AC
  Administered 2016-07-31: 32 mg via ORAL
  Filled 2016-07-29: qty 1

## 2016-07-29 MED ORDER — CARVEDILOL 6.25 MG PO TABS
6.2500 mg | ORAL_TABLET | Freq: Two times a day (BID) | ORAL | Status: DC
  Administered 2016-07-30 – 2016-08-04 (×7): 6.25 mg via ORAL
  Filled 2016-07-29 (×10): qty 1

## 2016-07-29 MED ORDER — DIPHENHYDRAMINE HCL 25 MG PO CAPS
25.0000 mg | ORAL_CAPSULE | ORAL | Status: AC
  Administered 2016-07-31: 25 mg via ORAL
  Filled 2016-07-29: qty 1

## 2016-07-29 NOTE — Progress Notes (Addendum)
Physical Therapy Treatment Patient Details Name: [name] Small MRN: [id number] DOB: 03-Jan-1991 Today's Date: 07/29/2016    History of Present Illness Patient is a 25 yo female admitted 07/26/16 after a fall resulting in T7, T8, T12 fractures.  Ortho - conservative treatment with TLSO.  Patient also with acute pulmonary edema, hyperkalemia (missed HD), and has been non-ambulatory since fall 07/22/16.     PMH:  ESRD on HD, NICM, EF 12-20%, AICD, HTN, anemia, CHF, sickle cell    PT Comments    Patient with improved mobility today.  Able to move to EOB and stand with brace and mod assist.  Pain continued to limit session.  Orthostatic vitals taken during session - did not exhibit orthostatic hypotension.  See vital sign flow sheet.   Follow Up Recommendations  Home health PT;Supervision for mobility/OOB     Equipment Recommendations  3in1 (PT)    Recommendations for Other Services       Precautions / Restrictions Precautions Precautions: Fall;Back Required Braces or Orthoses: Spinal Brace Spinal Brace: Thoracolumbosacral orthotic Restrictions Weight Bearing Restrictions: No    Mobility  Bed Mobility Overal bed mobility: Needs Assistance Bed Mobility: Rolling;Sidelying to Sit;Sit to Sidelying Rolling: Min guard Sidelying to sit: Mod assist     Sit to sidelying: Mod assist General bed mobility comments: Patient able to don socks in supine bringing foot up and across opposite LE.  Verbal cues for technique.  Able to roll to right side with use of rail and increased time.  Mod assist to bring trunk to upright position.  Max assist to don brace.  Patient able to maintain sitting balance with support of BUE's on bed.  Assist to control trunk and bring LE's onto bed to return to sidelying.  Transfers Overall transfer level: Needs assistance Equipment used: 2 person hand held assist (Had patient stand on scale to get her weight.  Used like RW) Transfers: Sit to/from Stand Sit to  Stand: Mod assist         General transfer comment: Verbal cues for hand placement.  Assist to power up to standing, with cues to extend hips and knees.  Patient able to stand x 2-3 minutes.  Assist to control descent to sitting EOB.  Ambulation/Gait             General Gait Details: Unable   Stairs            Wheelchair Mobility    Modified Rankin (Stroke Patients Only)       Balance Overall balance assessment: Needs assistance Sitting-balance support: Bilateral upper extremity supported;Feet supported Sitting balance-Leahy Scale: Poor     Standing balance support: Bilateral upper extremity supported Standing balance-Leahy Scale: Zero                      Cognition Arousal/Alertness: Awake/alert Behavior During Therapy: WFL for tasks assessed/performed;Anxious Overall Cognitive Status: Within Functional Limits for tasks assessed                      Exercises      General Comments General comments (skin integrity, edema, etc.): Performed orthostatic vitals - see Vital sign section.  Patient did not have orthostatic hypotension.      Pertinent Vitals/Pain Pain Assessment: 0-10 Pain Score: 8  Pain Location: back and shoulder area Pain Descriptors / Indicators: Aching;Grimacing;Guarding;Sharp;Shooting Pain Intervention(s): Limited activity within patient's tolerance;Monitored during session;Premedicated before session;Repositioned    Home Living  Prior Function            PT Goals (current goals can now be found in the care plan section) Progress towards PT goals: Progressing toward goals    Frequency    Min 5X/week      PT Plan Current plan remains appropriate    Co-evaluation             End of Session Equipment Utilized During Treatment: Gait belt;Back brace Activity Tolerance: Patient limited by pain Patient left: in bed;with call bell/phone within reach     Time: 9675-9163 PT  Time Calculation (min) (ACUTE ONLY): 40 min  Charges:  $Therapeutic Activity: 38-52 mins                    G Codes:      [name] 2016/08/27, 12:11 PM [name]. Renaldo Fiddler, [facility] Acute Rehab Services Pager [phone]

## 2016-07-29 NOTE — Progress Notes (Signed)
S/w Burman Freestone PA-C in regard to pt's current HPTH status. As per her last cardiac w/u she is very high risk with a EF 12%.  With a possible difficult and legnthy procedure, total parathyroidectomy and reimplantation, I believe her possibility of death, stroke, MI, is high in this type of procedure.    A referral to a tertiary care/academic center may be able to give her a second opinion with more support.

## 2016-07-29 NOTE — Progress Notes (Addendum)
PROGRESS NOTE    [name] Small  ZOX:[id number] DOB: Oct 06, 1991 DOA: 07/26/2016  PCP: Jeanann Lewandowsky, MD   Brief Narrative:  [name] Small is a 25 y.o. female with ESRD on hemodialysis noncompliant with dialysis, cardiomyopathy status post AICD placement as had recent stress test done in September 26 which showed EF of 12% with reversible ischemia, hypertension presents to the ER because of increasing upper back pain after a recent fall and shortness of breath. Labs show hyperkalemia. Patient states after a fall she was not able to walk due to pain and had not gone to her dialysis for at least 2 sessions. EKG shows some prominent T waves. On-call nephrologist has been consulted and patient is being taken for urgent dialysis. On exam patient complains of upper mid back pain and had come to the ER 2 days ago after a fall and x-rays at that time showed T8 compression fracture. Patient denies any incontinence of urine or bowels.   Subjective: Back pain is improving but it is still difficult for her to move without being in severe pain. Not constipated from narcotics. No dyspnea. No other complaints.   Assessment & Plan:   Principal Problem:   Acute pulmonary edema / volume overload/   ESRD on dialysis  - due to missing dialysis 2 x as she was in pain - appreciate Nephrology consult- attempting to find a new dialysis center for her  Active Problems:   Back pain- thoracic compression fractures - as a result of a fall in setting or osteoporosis - CT showed T7 fracture 18-19 %, subtle compression deformity of superior endplate of T8, probable acute/subacute fracture at T12  - pain control- will increase Percocet to 3 tabs QID, cont Naproxen and 1/2 the dose of PRN Dilaudid - I spoke with Dr Shon Baton, Ortho, who recommends conservative management for now with brace and pain rather than a kyphoplasty - if her hyperparathyroidism cannot be resolved via surgery, he will consider a more permanent  solution with a kyphoplasty - PRN Miralax for constipation  Secondary hyperparathyroidism - surgery consulted for parathyroidectomy- nephrology spoke with Dr Derrell Lolling who suggested that she would be a poor candidate for surgery due to the severity of her cardiomyopathy    Hyperkalemia - due to missing dialysis- has resolved after dialysis  Clotted Graft in LUE - currently has L thigh tunneled dialysis cath- vascular surgery consulted   - for permanent access on Monday    NICM (nonischemic cardiomyopathy) - EF 20-25 % - Coreg at 1/2 home dose (6.25) & Imdur- Hydralazine on hold for now- follow BP  Essential hypertension -  See above regarding meds  Acute thrombocytopenia - cause uncertain-receiving Heparin s/c-  follow   DVT prophylaxis: heparin Code Status: Full code Family Communication:  Disposition Plan: will d/c once access placed and dialysis center found Consultants:   Nephrology  IR Procedures:    Antimicrobials:  Anti-infectives    None     Objective: Vitals:   07/28/16 2005 07/29/16 0458 07/29/16 0933 07/29/16 1106  BP: 113/63 116/86 (!) 116/97   Pulse: 78 (!) 102 61   Resp: 18 19 17    Temp: 98.5 F (36.9 C) 97.5 F (36.4 C) 98.6 F (37 C)   TempSrc: Oral Oral Oral   SpO2: 97% 100% 99%   Weight: 47.5 kg (104 lb 11.5 oz)   51.3 kg (113 lb)  Height:        Intake/Output Summary (Last 24 hours) at 07/29/16 1343 Last data filed at  07/29/16 1106  Gross per 24 hour  Intake              900 ml  Output                0 ml  Net              900 ml   Filed Weights   07/28/16 1050 07/28/16 2005 07/29/16 1106  Weight: 47.7 kg (105 lb 2.6 oz) 47.5 kg (104 lb 11.5 oz) 51.3 kg (113 lb)    Examination: General exam: Appears uncomfortable due to pain HEENT: PERRLA, oral mucosa moist, no sclera icterus or thrush Respiratory system: Clear to auscultation. Respiratory effort normal. Cardiovascular system: S1 & S2 heard, RRR.  No murmurs  Gastrointestinal  system: Abdomen soft, non-tender, nondistended. Normal bowel sound. No organomegaly Back: tender along thoracic spine- difficult to localize pain more than this Central nervous system: Alert and oriented. No focal neurological deficits. Extremities: No cyanosis, clubbing or edema Skin: No rashes or ulcers Psychiatry:  Mood & affect appropriate.     Data Reviewed: I have personally reviewed following labs and imaging studies  CBC:  Recent Labs Lab 07/26/16 1803 07/26/16 1814 07/27/16 1140 07/27/16 1230 07/28/16 0451  WBC 7.4  --  5.9 6.0 6.0  NEUTROABS 5.8  --   --   --   --   HGB 11.4* 13.6 10.2* 10.1* 10.2*  HCT 35.0* 40.0 31.8* 31.9* 32.8*  MCV 89.1  --  88.3 88.1 89.1  PLT 219  --  138* 128* 116*   Basic Metabolic Panel:  Recent Labs Lab 07/26/16 1803 07/26/16 1814 07/26/16 2201 07/27/16 1140 07/27/16 1230 07/28/16 0451  NA 137 135 137 138 138 139  K 7.3* 6.9* 4.9 5.3* 5.2* 4.0  CL 99* 105 99* 100* 99* 95*  CO2 16*  --  21* 21* 21* 26  GLUCOSE 96 96 104* 95 97 89  BUN 148* 138* 98* 85* 86* 38*  CREATININE 14.96* 14.10* 10.67* 10.79* 10.34* 6.20*  CALCIUM 9.9  --  9.9 10.0 10.1 10.2  MG 2.4  --   --   --   --   --   PHOS  --   --  8.5*  --  9.0* 7.4*   GFR: Estimated Creatinine Clearance: 11 mL/min (by C-G formula based on SCr of 6.2 mg/dL (H)). Liver Function Tests:  Recent Labs Lab 07/26/16 1803 07/26/16 2201 07/27/16 1230 07/28/16 0451  AST 19  --   --   --   ALT 8*  --   --   --   ALKPHOS 282*  --   --   --   BILITOT 1.1  --   --   --   PROT 7.9  --   --   --   ALBUMIN 3.5 3.7 3.0* 3.0*   No results for input(s): LIPASE, AMYLASE in the last 168 hours. No results for input(s): AMMONIA in the last 168 hours. Coagulation Profile: No results for input(s): INR, PROTIME in the last 168 hours. Cardiac Enzymes: No results for input(s): CKTOTAL, CKMB, CKMBINDEX, TROPONINI in the last 168 hours. BNP (last 3 results) No results for input(s): PROBNP  in the last 8760 hours. HbA1C: No results for input(s): HGBA1C in the last 72 hours. CBG: No results for input(s): GLUCAP in the last 168 hours. Lipid Profile: No results for input(s): CHOL, HDL, LDLCALC, TRIG, CHOLHDL, LDLDIRECT in the last 72 hours. Thyroid Function Tests: No results for input(s):  TSH, T4TOTAL, FREET4, T3FREE, THYROIDAB in the last 72 hours. Anemia Panel: No results for input(s): VITAMINB12, FOLATE, FERRITIN, TIBC, IRON, RETICCTPCT in the last 72 hours. Urine analysis:    Component Value Date/Time   COLORURINE YELLOW 01/14/2013 1639   APPEARANCEUR CLOUDY (A) 01/14/2013 1639   LABSPEC 1.008 01/14/2013 1639   PHURINE 8.0 01/14/2013 1639   GLUCOSEU NEGATIVE 01/14/2013 1639   HGBUR SMALL (A) 01/14/2013 1639   BILIRUBINUR NEGATIVE 01/14/2013 1639   KETONESUR NEGATIVE 01/14/2013 1639   PROTEINUR 30 (A) 01/14/2013 1639   UROBILINOGEN 0.2 01/14/2013 1639   NITRITE NEGATIVE 01/14/2013 1639   LEUKOCYTESUR MODERATE (A) 01/14/2013 1639   Sepsis Labs: @LABRCNTIP (procalcitonin:4,lacticidven:4) ) Recent Results (from the past 240 hour(s))  MRSA PCR Screening     Status: None   Collection Time: 07/27/16  8:07 AM  Result Value Ref Range Status   MRSA by PCR NEGATIVE NEGATIVE Final    Comment:        The GeneXpert MRSA Assay (FDA approved for NASAL specimens only), is one component of a comprehensive MRSA colonization surveillance program. It is not intended to diagnose MRSA infection nor to guide or monitor treatment for MRSA infections.          Radiology Studies: No results found.    Scheduled Meds: . calcitRIOL  0.5 mcg Oral Q M,W,F-HD  . carvedilol  6.25 mg Oral BID WC  . cinacalcet  30 mg Oral BID WC  . [START ON 07/31/2016] diphenhydrAMINE  25 mg Oral On Call  . heparin  5,000 Units Subcutaneous Q8H  . hydrocerin   Topical BID  . isosorbide dinitrate  10 mg Oral TID  . [START ON 07/30/2016] methylPREDNISolone  32 mg Oral Once  . [START ON  07/31/2016] methylPREDNISolone  32 mg Oral Once  . multivitamin  1 tablet Oral QHS  . naproxen  500 mg Oral BID WC  . oxyCODONE  15 mg Oral QID  . sertraline  100 mg Oral Daily  . sevelamer carbonate  2,400 mg Oral TID WC   Continuous Infusions:    LOS: 3 days    Time spent in minutes: 35    [name] Foulks, MD [facility] Pager: [email or web address] Password [email or web address] 07/29/2016, 1:43 PM

## 2016-07-29 NOTE — Progress Notes (Signed)
Pharmacist Heart Failure Core Measure Documentation  Assessment: Helene Labo has an EF documented as 20-25% on 06/19/16 by ECHO.  Rationale: Heart failure patients with left ventricular systolic dysfunction (LVSD) and an EF < 40% should be prescribed an angiotensin converting enzyme inhibitor (ACEI) or angiotensin receptor blocker (ARB) at discharge unless a contraindication is documented in the medical record.  This patient is not currently on an ACEI or ARB for HF.  This note is being placed in the record in order to provide documentation that a contraindication to the use of these agents is present for this encounter.  ACE Inhibitor or Angiotensin Receptor Blocker is contraindicated (specify all that apply)  []   ACEI allergy AND ARB allergy []   Angioedema []   Moderate or severe aortic stenosis [x]   Hyperkalemia []   Hypotension []   Renal artery stenosis [x]   Worsening renal function, preexisting renal disease or dysfunction   Fredrik Rigger 07/29/2016 1:02 PM

## 2016-07-29 NOTE — Progress Notes (Signed)
Per MD order, attempted to obtain weight via [employer].  Premedicated patient with 1 mg of IV hydromorphone.  Even with this, the pain was so severe, the staff was not able to get patient on the hoyer pad.  Attempt was aborted.  Will continue to monitor patient.  [employer] RN-BC, WTA.

## 2016-07-29 NOTE — Consult Note (Signed)
Pt for central venogram right arm and right leg by Dr Randie Heinz on Monday.  Pt with prior hives with contrast.  Will premed with medrol and Benadryl.  NPO p midnight Sunday Consent  Fabienne Bruns, MD [facility] Office: [phone] Pager: [phone]

## 2016-07-29 NOTE — Progress Notes (Addendum)
[facility] Progress Note   Subjective: feeling better, no SOB.  Bad back pain, has not been able to walk for since her fall w compression fractures   Objective Vitals:   07/28/16 1631 07/28/16 2005 07/29/16 0458 07/29/16 0933  BP: 113/75 113/63 116/86 (!) 116/97  Pulse: (!) 56 78 (!) 102 61  Resp: 14 18 19 17   Temp: 98.7 F (37.1 C) 98.5 F (36.9 C) 97.5 F (36.4 C) 98.6 F (37 C)  TempSrc: Oral Oral Oral Oral  SpO2: 97% 97% 100% 99%  Weight:  47.5 kg (104 lb 11.5 oz)    Height:       Physical Exam General: Young, pleasant female. NAD, facial edema present. Heart: S3 present. RRR; no murmur Lungs: Unable to sit up due to back pain. Lungs clear anteriorly Extremities: 1+ pedal edema Dialysis Access: Femoral perm-cath. Also with recently clotted LUE AVF, unclear if salvageable.  Dialysis Orders: [facility] on MWF, but changing to [facility]. 4 hours, EDW 54, 2K/2Ca bath, 180 dialyzer, BFR 400/DFR 800 - Heparin 2000 unit bolus - Calcitriol 0.52mcg PO q HD (last Ca 9.6, Phos 6.7, PTH 4556) - No ESA   Assessment: 1. Multiple vertebral comp fx's/ sp fall/ severe renal osteodystrophy: unable to get OOB now. PT working w patient. Needs eventually PTX 2. Clotted access LUA: VVS consulted for new permanent access. Per Dr Darrick Penna plan is for UE and LE venogram on Monday to help w access planning 3. Secondary HPTH: Severely uncontrolled with last PTH > 4500; continue current binders/VDRA/Sensipar dose for now and will adjust doses once taking meds consistently. She saw Dr. Derrell Lolling in OP setting for parathyroidectomy eval (see 05/29/16 note in epic) 4. Vol overload - resolved. Is about 6kg under dry wt.  Don't need to lower anymore.  5. Hyperkalemia - resolved 6. 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 Anemia: Last Hgb 13.6; no ESA needed at this time 8. Non-ischemic cardiomyopathy (s/p AICD; EF 12-20%): will resume coreg at 1/2 dose for CM (6.25  bid) 9. HTN: off of home hydral/ coreg.  BP's low-normal. Will resume coreg at above for NICM.   Plan - HD today, o/w as above   Additional Objective Labs: Basic Metabolic Panel:  Recent Labs Lab 07/26/16 2201 07/27/16 1140 07/27/16 1230 07/28/16 0451  NA 137 138 138 139  K 4.9 5.3* 5.2* 4.0  CL 99* 100* 99* 95*  CO2 21* 21* 21* 26  GLUCOSE 104* 95 97 89  BUN 98* 85* 86* 38*  CREATININE 10.67* 10.79* 10.34* 6.20*  CALCIUM 9.9 10.0 10.1 10.2  PHOS 8.5*  --  9.0* 7.4*   Liver Function Tests:  Recent Labs Lab 07/26/16 1803 07/26/16 2201 07/27/16 1230 07/28/16 0451  AST 19  --   --   --   ALT 8*  --   --   --   ALKPHOS 282*  --   --   --   BILITOT 1.1  --   --   --   PROT 7.9  --   --   --   ALBUMIN 3.5 3.7 3.0* 3.0*   CBC:  Recent Labs Lab 07/26/16 1803  07/27/16 1140 07/27/16 1230 07/28/16 0451  WBC 7.4  --  5.9 6.0 6.0  NEUTROABS 5.8  --   --   --   --   HGB 11.4*  < > 10.2* 10.1* 10.2*  HCT 35.0*  < > 31.8* 31.9* 32.8*  MCV 89.1  --  88.3 88.1 89.1  PLT 219  --  138* 128* 116*  < > = values in this interval not displayed. Studies/Results: No results found. Medications:   . calcitRIOL  0.5 mcg Oral Q M,W,F-HD  . cinacalcet  30 mg Oral BID WC  . heparin  5,000 Units Subcutaneous Q8H  . hydrocerin   Topical BID  . isosorbide dinitrate  10 mg Oral TID  . multivitamin  1 tablet Oral QHS  . naproxen  500 mg Oral BID WC  . oxyCODONE  15 mg Oral QID  . sertraline  100 mg Oral Daily  . sevelamer carbonate  2,400 mg Oral TID WC

## 2016-07-30 LAB — CBC
HEMATOCRIT: 34.4 % — AB (ref 36.0–46.0)
Hemoglobin: 10.7 g/dL — ABNORMAL LOW (ref 12.0–15.0)
MCH: 28.1 pg (ref 26.0–34.0)
MCHC: 31.1 g/dL (ref 30.0–36.0)
MCV: 90.3 fL (ref 78.0–100.0)
Platelets: 115 10*3/uL — ABNORMAL LOW (ref 150–400)
RBC: 3.81 MIL/uL — ABNORMAL LOW (ref 3.87–5.11)
RDW: 18.6 % — AB (ref 11.5–15.5)
WBC: 7.1 10*3/uL (ref 4.0–10.5)

## 2016-07-30 NOTE — Progress Notes (Signed)
PROGRESS NOTE    [name] Small  WUJ:[id number] DOB: Feb 14, 1991 DOA: 07/26/2016  PCP: Jeanann Lewandowsky, MD   Brief Narrative:  [name] Small is a 25 y.o. female with ESRD on hemodialysis noncompliant with dialysis, cardiomyopathy status post AICD placement as had recent stress test done in September 26 which showed EF of 12% with reversible ischemia, hypertension presents to the ER because of increasing upper back pain after a recent fall and shortness of breath. Labs show hyperkalemia. Patient states after a fall she was not able to walk due to pain and had not gone to her dialysis for at least 2 sessions. EKG shows some prominent T waves. On-call nephrologist has been consulted and patient is being taken for urgent dialysis. On exam patient complains of upper mid back pain and had come to the ER 2 days ago after a fall and x-rays at that time showed T8 compression fracture. Patient denies any incontinence of urine or bowels.   Subjective: Back pain is improving but it is still difficult for her to move without being in severe pain. Was able to stand with PT yesterday. No BM in 2 days. No dyspnea. No other complaints.   Assessment & Plan:   Principal Problem:   Acute pulmonary edema / volume overload/   ESRD on dialysis  - due to missing dialysis 2 x as she was in pain - appreciate Nephrology consult- attempting to find a new dialysis center for her  Active Problems:   Back pain- thoracic compression fractures - as a result of a fall in setting or osteoporosis - CT showed T7 fracture 18-19 %, subtle compression deformity of superior endplate of T8, probable acute/subacute fracture at T12  - pain control-   increased Percocet to 3 tabs QID, cont Naproxen and 1/2 the dose of PRN Dilaudid - I spoke with Dr Shon Baton, Ortho, who recommends conservative management for now with brace and pain rather than a kyphoplasty - if her hyperparathyroidism cannot be resolved via surgery, he will consider  a more permanent solution with a kyphoplasty - PRN Miralax for constipation  Secondary hyperparathyroidism - surgery consulted for parathyroidectomy- nephrology spoke with Dr Derrell Lolling who suggested that she would be a poor candidate for surgery due to the severity of her cardiomyopathy    Hyperkalemia - due to missing dialysis- has resolved after dialysis  Clotted Graft in LUE - currently has L thigh tunneled dialysis cath- vascular surgery consulted   - for permanent access on Monday    NICM (nonischemic cardiomyopathy) - EF 20-25 % - Coreg at 1/2 home dose (6.25) & Imdur- Hydralazine on hold for now- follow BP  Essential hypertension -  See above regarding meds  Acute thrombocytopenia - cause uncertain-receiving Heparin s/c- platelets dropping daily- will discuss stopping heparin with nephrology   DVT prophylaxis: heparin Code Status: Full code Family Communication:  Disposition Plan: will d/c once access placed and dialysis center found Consultants:   Nephrology  IR Procedures:    Antimicrobials:  Anti-infectives    None     Objective: Vitals:   07/29/16 1106 07/29/16 1700 07/29/16 2116 07/30/16 0459  BP:  113/72 112/90 115/77  Pulse:  (!) 52 (!) 103 98  Resp:  Temp:  99.2 F (37.3 C) 99.2 F (37.3 C) 97.7 F (36.5 C)  TempSrc:  Oral Oral Oral  SpO2:  100% 98% 98%  Weight: 51.3 kg (113 lb)     Height:        Intake/Output  Summary (Last 24 hours) at 07/30/16 1243 Last data filed at 07/30/16 1128  Gross per 24 hour  Intake              480 ml  Output                0 ml  Net              480 ml   Filed Weights   07/28/16 1050 07/28/16 2005 07/29/16 1106  Weight: 47.7 kg (105 lb 2.6 oz) 47.5 kg (104 lb 11.5 oz) 51.3 kg (113 lb)    Examination: General exam: Appears uncomfortable due to pain HEENT: PERRLA, oral mucosa moist, no sclera icterus or thrush Respiratory system: Clear to auscultation. Respiratory effort normal. Cardiovascular  system: S1 & S2 heard, RRR.  No murmurs  Gastrointestinal system: Abdomen soft, non-tender, nondistended. Normal bowel sound. No organomegaly Back: tender along thoracic spine in T8 and T 12 vicinity Central nervous system: Alert and oriented. No focal neurological deficits. Extremities: No cyanosis, clubbing or edema Skin: No rashes or ulcers Psychiatry:  Mood & affect appropriate.     Data Reviewed: I have personally reviewed following labs and imaging studies  CBC:  Recent Labs Lab 07/26/16 1803 07/26/16 1814 07/27/16 1140 07/27/16 1230 07/28/16 0451 07/30/16 0423  WBC 7.4  --  5.9 6.0 6.0 7.1  NEUTROABS 5.8  --   --   --   --   --   HGB 11.4* 13.6 10.2* 10.1* 10.2* 10.7*  HCT 35.0* 40.0 31.8* 31.9* 32.8* 34.4*  MCV 89.1  --  88.3 88.1 89.1 90.3  PLT 219  --  138* 128* 116* 115*   Basic Metabolic Panel:  Recent Labs Lab 07/26/16 1803 07/26/16 1814 07/26/16 2201 07/27/16 1140 07/27/16 1230 07/28/16 0451  NA 137 135 137 138 138 139  K 7.3* 6.9* 4.9 5.3* 5.2* 4.0  CL 99* 105 99* 100* 99* 95*  CO2 16*  --  21* 21* 21* 26  GLUCOSE 96 96 104* 95 97 89  BUN 148* 138* 98* 85* 86* 38*  CREATININE 14.96* 14.10* 10.67* 10.79* 10.34* 6.20*  CALCIUM 9.9  --  9.9 10.0 10.1 10.2  MG 2.4  --   --   --   --   --   PHOS  --   --  8.5*  --  9.0* 7.4*   GFR: Estimated Creatinine Clearance: 11 mL/min (by C-G formula based on SCr of 6.2 mg/dL (H)). Liver Function Tests:  Recent Labs Lab 07/26/16 1803 07/26/16 2201 07/27/16 1230 07/28/16 0451  AST 19  --   --   --   ALT 8*  --   --   --   ALKPHOS 282*  --   --   --   BILITOT 1.1  --   --   --   PROT 7.9  --   --   --   ALBUMIN 3.5 3.7 3.0* 3.0*   No results for input(s): LIPASE, AMYLASE in the last 168 hours. No results for input(s): AMMONIA in the last 168 hours. Coagulation Profile: No results for input(s): INR, PROTIME in the last 168 hours. Cardiac Enzymes: No results for input(s): CKTOTAL, CKMB, CKMBINDEX,  TROPONINI in the last 168 hours. BNP (last 3 results) No results for input(s): PROBNP in the last 8760 hours. HbA1C: No results for input(s): HGBA1C in the last 72 hours. CBG: No results for input(s): GLUCAP in the last 168 hours. Lipid Profile: No results  for input(s): CHOL, HDL, LDLCALC, TRIG, CHOLHDL, LDLDIRECT in the last 72 hours. Thyroid Function Tests: No results for input(s): TSH, T4TOTAL, FREET4, T3FREE, THYROIDAB in the last 72 hours. Anemia Panel: No results for input(s): VITAMINB12, FOLATE, FERRITIN, TIBC, IRON, RETICCTPCT in the last 72 hours. Urine analysis:    Component Value Date/Time   COLORURINE YELLOW 01/14/2013 1639   APPEARANCEUR CLOUDY (A) 01/14/2013 1639   LABSPEC 1.008 01/14/2013 1639   PHURINE 8.0 01/14/2013 1639   GLUCOSEU NEGATIVE 01/14/2013 1639   HGBUR SMALL (A) 01/14/2013 1639   BILIRUBINUR NEGATIVE 01/14/2013 1639   KETONESUR NEGATIVE 01/14/2013 1639   PROTEINUR 30 (A) 01/14/2013 1639   UROBILINOGEN 0.2 01/14/2013 1639   NITRITE NEGATIVE 01/14/2013 1639   LEUKOCYTESUR MODERATE (A) 01/14/2013 1639   Sepsis Labs: @LABRCNTIP (procalcitonin:4,lacticidven:4) ) Recent Results (from the past 240 hour(s))  MRSA PCR Screening     Status: None   Collection Time: 07/27/16  8:07 AM  Result Value Ref Range Status   MRSA by PCR NEGATIVE NEGATIVE Final    Comment:        The GeneXpert MRSA Assay (FDA approved for NASAL specimens only), is one component of a comprehensive MRSA colonization surveillance program. It is not intended to diagnose MRSA infection nor to guide or monitor treatment for MRSA infections.          Radiology Studies: No results found.    Scheduled Meds: . calcitRIOL  0.5 mcg Oral Q M,W,F-HD  . carvedilol  6.25 mg Oral BID WC  . cinacalcet  30 mg Oral BID WC  . [START ON 07/31/2016] diphenhydrAMINE  25 mg Oral On Call  . heparin  5,000 Units Subcutaneous Q8H  . hydrocerin   Topical BID  . isosorbide dinitrate  10 mg  Oral TID  . methylPREDNISolone  32 mg Oral Once  . [START ON 07/31/2016] methylPREDNISolone  32 mg Oral Once  . multivitamin  1 tablet Oral QHS  . naproxen  500 mg Oral BID WC  . oxyCODONE  15 mg Oral QID  . sertraline  100 mg Oral Daily  . sevelamer carbonate  2,400 mg Oral TID WC   Continuous Infusions:    LOS: 4 days    Time spent in minutes: 35    Alexandra Lipps, MD [facility] Pager: [email or web address] Password [email or web address] 07/30/2016, 12:43 PM

## 2016-07-30 NOTE — Progress Notes (Addendum)
[facility] Progress Note   Subjective: feeling better, no SOB.  Got up yest w PT and pre-activity pain meds  Objective Vitals:   07/29/16 1106 07/29/16 1700 07/29/16 2116 07/30/16 0459  BP:  113/72 112/90 115/77  Pulse:  (!) 52 (!) 103 98  Resp:  16 18 16   Temp:  99.2 F (37.3 C) 99.2 F (37.3 C) 97.7 F (36.5 C)  TempSrc:  Oral Oral Oral  SpO2:  100% 98% 98%  Weight: 51.3 kg (113 lb)     Height:       Physical Exam General: Young, pleasant female. NAD, calm Heart: S3 present. RRR; no murmur Lungs: Unable to sit up due to back pain. Lungs clear anteriorly Extremities: faint pedal edema Dialysis Access: Femoral perm-cath. Also with recently clotted LUE AVF, unclear if salvageable.  Dialysis: [facility] on MWF (changing to [facility] unit now) 4h   54kg  2/2 bath  400/800   Fem HD cath  Hep 2000 - Calcitriol 0.685mcg PO q HD (last Ca 9.6, Phos 6.7, PTH 4556) - No ESA   Assessment: 1. Multiple vertebral comp fx's sp fall/ severe renal osteodystrophy: PT working w patient 2. Clotted access LUA: VVS consulted for new permanent access. Per Dr Darrick PennaFields plan is for RUE/ RLE venogram on Monday to help w access planning 3. Secondary HPTH: Severely uncontrolled with last PTH > 4500; continue current binders/VDRA/Sensipar dose for now and will adjust doses once taking meds consistently. Per Dr. Derrell Lollingamirez (gen surg) notes, pt may be better served in tertiary care center for parathyroidectomy given severe cardiac dz 4. Vol overload - resolved. 3kg under dry wt.  5. Hyperkalemia - resolved 6. 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 Plan is to CLIP to [facility] new [place] unit per Dr Jon GillsGoldsborough's recommendations 7. Anemia: Last Hgb 11, no ESA needed at this time 8. Non-ischemic cardiomyopathy (s/p AICD; EF 12-20%): on low-dose coreg for CM 9. HTN: vol down, bp's down,  off of hydral. Getting low-dose coreg for NICM.   Plan - HD Monday, min UF,  venogram Monday. DC once perm access established and CLIP to new unit completed.        Additional Objective Labs: Basic Metabolic Panel:  Recent Labs Lab 07/26/16 2201 07/27/16 1140 07/27/16 1230 07/28/16 0451  NA 137 138 138 139  K 4.9 5.3* 5.2* 4.0  CL 99* 100* 99* 95*  CO2 21* 21* 21* 26  GLUCOSE 104* 95 97 89  BUN 98* 85* 86* 38*  CREATININE 10.67* 10.79* 10.34* 6.20*  CALCIUM 9.9 10.0 10.1 10.2  PHOS 8.5*  --  9.0* 7.4*   Liver Function Tests:  Recent Labs Lab 07/26/16 1803 07/26/16 2201 07/27/16 1230 07/28/16 0451  AST 19  --   --   --   ALT 8*  --   --   --   ALKPHOS 282*  --   --   --   BILITOT 1.1  --   --   --   PROT 7.9  --   --   --   ALBUMIN 3.5 3.7 3.0* 3.0*   CBC:  Recent Labs Lab 07/26/16 1803  07/27/16 1140 07/27/16 1230 07/28/16 0451 07/30/16 0423  WBC 7.4  --  5.9 6.0 6.0 7.1  NEUTROABS 5.8  --   --   --   --   --   HGB 11.4*  < > 10.2* 10.1* 10.2* 10.7*  HCT 35.0*  < >  31.8* 31.9* 32.8* 34.4*  MCV 89.1  --  88.3 88.1 89.1 90.3  PLT 219  --  138* 128* 116* 115*  < > = values in this interval not displayed. Studies/Results: No results found. Medications:   . calcitRIOL  0.5 mcg Oral Q M,W,F-HD  . carvedilol  6.25 mg Oral BID WC  . cinacalcet  30 mg Oral BID WC  . [START ON 07/31/2016] diphenhydrAMINE  25 mg Oral On Call  . heparin  5,000 Units Subcutaneous Q8H  . hydrocerin   Topical BID  . isosorbide dinitrate  10 mg Oral TID  . methylPREDNISolone  32 mg Oral Once  . [START ON 07/31/2016] methylPREDNISolone  32 mg Oral Once  . multivitamin  1 tablet Oral QHS  . naproxen  500 mg Oral BID WC  . oxyCODONE  15 mg Oral QID  . sertraline  100 mg Oral Daily  . sevelamer carbonate  2,400 mg Oral TID WC

## 2016-07-30 NOTE — Progress Notes (Signed)
Physical Therapy Treatment Patient Details Name: [name] Small MRN: [id number] DOB: 15-Aug-1991 Today's Date: 07/30/2016    History of Present Illness Patient is a 25 yo female admitted 07/26/16 after a fall resulting in T7, T8, T12 fractures.  Ortho - conservative treatment with TLSO.  Patient also with acute pulmonary edema, hyperkalemia (missed HD), and has been non-ambulatory since fall 07/22/16.     PMH:  ESRD on HD, NICM, EF 12-20%, AICD, HTN, anemia, CHF, sickle cell    PT Comments    Patient with improved mobility today - able to ambulate 12' with RW and min assist.  Pain continues to be limiting factor.  Follow Up Recommendations  Home health PT;Supervision for mobility/OOB     Equipment Recommendations  3in1 (PT)    Recommendations for Other Services       Precautions / Restrictions Precautions Precautions: Fall;Back Required Braces or Orthoses: Spinal Brace Spinal Brace: Thoracolumbosacral orthotic Restrictions Weight Bearing Restrictions: No    Mobility  Bed Mobility Overal bed mobility: Needs Assistance Bed Mobility: Rolling;Sidelying to Sit Rolling: Supervision Sidelying to sit: Min assist       General bed mobility comments: Patient donned socks in supine.  Able to roll with use of rail.  Able to bring LE's off of bed.  Assist to bring trunk to sitting - patient using UE's to push up.  Able to maintain balance in sitting.  Max assist to don TLSO.  Provided education to boyfriend on managing brace.  Transfers Overall transfer level: Needs assistance Equipment used: Rolling walker (2 wheeled) Transfers: Sit to/from Stand Sit to Stand: Min assist         General transfer comment: Verbal cues for hand placement.  Cues to push through LE's to stand.  Min assist to rise to standing and for balance.  Cues to stand upright.   Verbal cues for technique to move to sitting in chair.  Assist to control descent.  Ambulation/Gait Ambulation/Gait assistance: Min  assist Ambulation Distance (Feet): 12 Feet Assistive device: Rolling walker (2 wheeled) Gait Pattern/deviations: Step-through pattern;Decreased step length - right;Decreased step length - left;Decreased stride length;Shuffle Gait velocity: decreased Gait velocity interpretation: Below normal speed for age/gender General Gait Details: Verbal cues for safe use of RW.  Assist to maneuver RW especially during turns.  Assist for safety/balance.  Patient with slow, guarded gait.   Stairs            Wheelchair Mobility    Modified Rankin (Stroke Patients Only)       Balance Overall balance assessment: Needs assistance Sitting-balance support: No upper extremity supported;Feet supported Sitting balance-Leahy Scale: Fair     Standing balance support: Bilateral upper extremity supported Standing balance-Leahy Scale: Poor                      Cognition Arousal/Alertness: Awake/alert Behavior During Therapy: WFL for tasks assessed/performed;Anxious Overall Cognitive Status: Within Functional Limits for tasks assessed                      Exercises      General Comments        Pertinent Vitals/Pain Pain Assessment: 0-10 Pain Score: 7  Pain Location: back Pain Descriptors / Indicators: Aching;Grimacing;Sharp Pain Intervention(s): Premedicated before session;Monitored during session;Repositioned    Home Living                      Prior Function  PT Goals (current goals can now be found in the care plan section) Acute Rehab PT Goals Patient Stated Goal: To decrease pain Progress towards PT goals: Progressing toward goals    Frequency    Min 5X/week      PT Plan Current plan remains appropriate    Co-evaluation             End of Session Equipment Utilized During Treatment: Gait belt;Back brace Activity Tolerance: Patient limited by pain Patient left: in chair;with call bell/phone within reach;with family/visitor  present     Time: 7829-56211054-1126 PT Time Calculation (min) (ACUTE ONLY): 32 min  Charges:  $Gait Training: 8-22 mins $Therapeutic Activity: 8-22 mins                    G Codes:      Vena Small, [name] Sookdeo H 07/30/2016, 1:30 PM [name] HurtSusan H. Renaldo Small, PT, [facility] Acute Rehab Services Pager [phone]

## 2016-07-30 NOTE — Care Management Note (Signed)
Case Management Note  Patient Details  Name: Esmie Romrell MRN: [id number] Date of Birth: 1990-11-09  Subjective/Objective:           Patient is a 25 yo female admitted 07/26/16 after a fall resulting in T7, T8, T12 fractures.  Ortho - conservative treatment with TLSO.Marland Kitchen              Action/Plan: Discussed care transitional planning with patient. Patient is ESRD on HD M/W/F.  Recommendations for [facility] services discussed with patient patient agreeable with plan. Offered choice [facility] selected.Referral called in to [facility] liaison, services confirmed.  DME 3n1 ordered and delivered to room by [facility]., Patient verbalized understanding teach back done. No further CM needs identified.  Expected Discharge Date:   07/31/16               Expected Discharge Plan:  Home w Home Health Services  In-House Referral:     Discharge planning Services  CM Consult  Post Acute Care Choice:    Choice offered to:  Patient  DME Arranged:  3-N-1 DME Agency:  [facility]  HH Arranged:  RN, PT, OT [facility] Agency:  [facility]  Status of Service:  Completed, signed off  If discussed at Long Length of Stay Meetings, dates discussed:    Additional CommentsMichel Bickers, RN 07/30/2016, 2:40 PM

## 2016-07-31 ENCOUNTER — Encounter (HOSPITAL_COMMUNITY)
Admission: EM | Disposition: A | Discharge status: Home Health | Payer: Self-pay | Source: Home / Self Care | Attending: Internal Medicine

## 2016-07-31 HISTORY — PX: PERIPHERAL VASCULAR CATHETERIZATION: SHX172C

## 2016-07-31 LAB — RENAL FUNCTION PANEL
ALBUMIN: 3 g/dL — AB (ref 3.5–5.0)
Anion gap: 19 — ABNORMAL HIGH (ref 5–15)
BUN: 71 mg/dL — AB (ref 6–20)
CO2: 21 mmol/L — ABNORMAL LOW (ref 22–32)
CREATININE: 8.63 mg/dL — AB (ref 0.44–1.00)
Calcium: 8.3 mg/dL — ABNORMAL LOW (ref 8.9–10.3)
Chloride: 97 mmol/L — ABNORMAL LOW (ref 101–111)
GFR calc Af Amer: 7 mL/min — ABNORMAL LOW (ref >60)
GFR, EST NON AFRICAN AMERICAN: 6 mL/min — AB (ref >60)
Glucose, Bld: 122 mg/dL — ABNORMAL HIGH (ref 65–99)
PHOSPHORUS: 8.2 mg/dL — AB (ref 2.5–4.6)
Potassium: 5.1 mmol/L (ref 3.5–5.1)
Sodium: 137 mmol/L (ref 135–145)

## 2016-07-31 LAB — CBC
HCT: 34.2 % — ABNORMAL LOW (ref 36.0–46.0)
Hemoglobin: 10.9 g/dL — ABNORMAL LOW (ref 12.0–15.0)
MCH: 28.7 pg (ref 26.0–34.0)
MCHC: 31.9 g/dL (ref 30.0–36.0)
MCV: 90 fL (ref 78.0–100.0)
PLATELETS: 144 10*3/uL — AB (ref 150–400)
RBC: 3.8 MIL/uL — AB (ref 3.87–5.11)
RDW: 18.7 % — ABNORMAL HIGH (ref 11.5–15.5)
WBC: 4.9 10*3/uL (ref 4.0–10.5)

## 2016-07-31 LAB — HCG, SERUM, QUALITATIVE: PREG SERUM: NEGATIVE

## 2016-07-31 SURGERY — UPPER EXTREMITY VENOGRAPHY
Anesthesia: LOCAL | Laterality: Right

## 2016-07-31 MED ORDER — LIDOCAINE-PRILOCAINE 2.5-2.5 % EX CREA: 1.0000 "application " | TOPICAL_CREAM | CUTANEOUS | Status: DC | PRN

## 2016-07-31 MED ORDER — DIPHENHYDRAMINE HCL 25 MG PO CAPS
25.0000 mg | ORAL_CAPSULE | Freq: Once | ORAL | Status: AC
  Administered 2016-07-31: 25 mg via ORAL
  Filled 2016-07-31: qty 1

## 2016-07-31 MED ORDER — HEPARIN SODIUM (PORCINE) 1000 UNIT/ML DIALYSIS: 1000.0000 [IU] | INTRAMUSCULAR | Status: DC | PRN

## 2016-07-31 MED ORDER — ALTEPLASE 2 MG IJ SOLR: 2.0000 mg | Freq: Once | INTRAMUSCULAR | Status: DC | PRN

## 2016-07-31 MED ORDER — SODIUM CHLORIDE 0.9 % IV SOLN: 100.0000 mL | INTRAVENOUS | Status: DC | PRN

## 2016-07-31 MED ORDER — OXYCODONE HCL 5 MG PO TABS
ORAL_TABLET | ORAL | Status: AC
  Administered 2016-07-31: 15 mg via ORAL
  Filled 2016-07-31: qty 3

## 2016-07-31 MED ORDER — PENTAFLUOROPROP-TETRAFLUOROETH EX AERO: 1.0000 "application " | INHALATION_SPRAY | CUTANEOUS | Status: DC | PRN

## 2016-07-31 MED ORDER — HEPARIN SODIUM (PORCINE) 1000 UNIT/ML DIALYSIS: 2000.0000 [IU] | Freq: Once | INTRAMUSCULAR | Status: DC

## 2016-07-31 MED ORDER — NEPRO/CARBSTEADY PO LIQD: 237.0000 mL | Freq: Two times a day (BID) | ORAL | Status: DC

## 2016-07-31 MED ORDER — DIPHENHYDRAMINE HCL 50 MG/ML IJ SOLN
INTRAMUSCULAR | Status: AC
  Administered 2016-07-31: 25 mg via INTRAVENOUS
  Filled 2016-07-31: qty 1

## 2016-07-31 MED ORDER — DIPHENHYDRAMINE HCL 50 MG/ML IJ SOLN
25.0000 mg | Freq: Once | INTRAMUSCULAR | Status: AC
  Administered 2016-07-31: 25 mg via INTRAVENOUS

## 2016-07-31 MED ORDER — LIDOCAINE HCL (PF) 1 % IJ SOLN: 5.0000 mL | INTRAMUSCULAR | Status: DC | PRN

## 2016-07-31 MED ORDER — HYDROMORPHONE HCL 1 MG/ML IJ SOLN
INTRAMUSCULAR | Status: AC
  Administered 2016-07-31: 0.5 mg via INTRAVENOUS
  Filled 2016-07-31: qty 1

## 2016-07-31 MED ORDER — IODIXANOL 320 MG/ML IV SOLN
INTRAVENOUS | Status: DC | PRN
  Administered 2016-07-31: 30 mL via INTRAVENOUS

## 2016-07-31 SURGICAL SUPPLY — 4 items
KIT PV (KITS) IMPLANT
STOPCOCK MORSE 400PSI 3WAY (MISCELLANEOUS) ×4 IMPLANT
TRANSDUCER W/STOPCOCK (MISCELLANEOUS) IMPLANT
TUBING CIL FLEX 10 FLL-RA (TUBING) ×2 IMPLANT

## 2016-07-31 NOTE — Progress Notes (Signed)
Patient arrived to unit by bed.  Reviewed treatment plan and this RN agrees with plan.  Report received from bedside RN, Thayer Ohm.  Consent verified.  Patient A & O X 4 .   Lung sounds clear to ausculation in all fields. Generalized BLE edema. Cardiac:  NSR.  Removed caps and cleansed L thigh catheter with chlorhedxidine.  Aspirated ports of heparin and flushed them with saline per protocol.  Connected and secured lines, initiated treatment at 1753.  UF Goal of and net fluid removal 0.5 L.  Will continue to monitor.

## 2016-07-31 NOTE — Op Note (Signed)
    Patient name: [name] Small MRN: [id number] DOB: Aug 09, 1991 Sex: female  07/31/2016 Pre-operative Diagnosis: esrd Post-operative diagnosis:  Same Surgeon:  Apolinar Junes C. Randie Heinz, MD Procedure Performed: 1.  Right upper extremity venogram  Indications:  25yo AAF here with T spine fractures. She has previous left arm AV graft that is now thrombosed greater than 2 weeks. She also has a previously failed fistula on the right as well as an ICD on the right which may have contributed to this. She is therefore indicated for venogram for evaluation of possible graft placement on the right and possible need for femoral venogram as well.  Findings: Patient has palpable brachial pulses does not feel disease on the right side. She has runoff on the right with a patent subclavian possible stenosis at her SVC innominate junction however there are no filling collaterals. She therefore is likely a candidate for a brachial axillary or axillary axillary AV graft.   Procedure:  Patient was brought to the angina suite she is placed supine on the operating table. Her existing IV at the level of the wrist was cannulated and contrast injected for right upper extremity. The above findings were noted. Satisfied that she will be candidate for AV graft placement we terminated the procedure the IV was disconnected left in place patient transferred to the bed in stable condition.  Contrast 30cc  There was no moderate sedation administered for this operation.    Cherylanne Ardelean C. Randie Heinz, MD [facility] Office: [phone] Pager: [phone]

## 2016-07-31 NOTE — Progress Notes (Signed)
[facility] Progress Note   Subjective:  Sitting up in bed, says back pain is slightly better. Still having dyspnea and chest pain with exertion and deep breathing, but none at rest. Denies abdominal pain or any other new symptoms.  Objective Vitals:   07/30/16 1740 07/30/16 2150 07/31/16 0506 07/31/16 0913  BP: 102/69 100/65 116/78 112/84  Pulse: 92 91 87 89  Resp: 14 18 18 18   Temp: 98.3 F (36.8 C) 98.5 F (36.9 C) 98.3 F (36.8 C) 98.2 F (36.8 C)  TempSrc: Oral Oral Oral Oral  SpO2: 98% 96% 93% 98%  Weight:      Height:       Physical Exam General: Young, pleasant female. NAD. Heart: S3 present. Slightly tachycardic. No murmur. Lungs: CTAB. Abdomen: Soft, non-tender. Extremities: Trace LE edema. Dialysis Access: L femoral PC  Dialysis Orders: [name] Small on MWF, now accepted at [facility] unit (will be MWF 2nd shift, can start there once discharged). 4 hours, EDW 54, 2K/2Ca bath, 180 dialyzer, BFR 400/DFR 800 - Heparin 2000 unit bolus - Calcitriol 0.19mcg PO q HD (last Ca 9.6, Phos 6.7, PTH 4556) - No ESA   Additional Objective Labs: Basic Metabolic Panel:  Recent Labs Lab 07/26/16 2201 07/27/16 1140 07/27/16 1230 07/28/16 0451  NA 137 138 138 139  K 4.9 5.3* 5.2* 4.0  CL 99* 100* 99* 95*  CO2 21* 21* 21* 26  GLUCOSE 104* 95 97 89  BUN 98* 85* 86* 38*  CREATININE 10.67* 10.79* 10.34* 6.20*  CALCIUM 9.9 10.0 10.1 10.2  PHOS 8.5*  --  9.0* 7.4*   Liver Function Tests:  Recent Labs Lab 07/26/16 1803 07/26/16 2201 07/27/16 1230 07/28/16 0451  AST 19  --   --   --   ALT 8*  --   --   --   ALKPHOS 282*  --   --   --   BILITOT 1.1  --   --   --   PROT 7.9  --   --   --   ALBUMIN 3.5 3.7 3.0* 3.0*   CBC:  Recent Labs Lab 07/26/16 1803  07/27/16 1140 07/27/16 1230 07/28/16 0451 07/30/16 0423 07/31/16 0626  WBC 7.4  --  5.9 6.0 6.0 7.1 4.9  NEUTROABS 5.8  --   --   --   --   --   --   HGB 11.4*  < > 10.2* 10.1* 10.2*  10.7* 10.9*  HCT 35.0*  < > 31.8* 31.9* 32.8* 34.4* 34.2*  MCV 89.1  --  88.3 88.1 89.1 90.3 90.0  PLT 219  --  138* 128* 116* 115* 144*  < > = values in this interval not displayed.  Studies/Results: No results found. Medications:   . calcitRIOL  0.5 mcg Oral Q M,W,F-HD  . carvedilol  6.25 mg Oral BID WC  . cinacalcet  30 mg Oral BID WC  . heparin  5,000 Units Subcutaneous Q8H  . hydrocerin   Topical BID  . isosorbide dinitrate  10 mg Oral TID  . multivitamin  1 tablet Oral QHS  . naproxen  500 mg Oral BID WC  . oxyCODONE  15 mg Oral QID  . sertraline  100 mg Oral Daily  . 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 Found to have T7, T8, T12 vertebral compression fractures. Ortho has decided to treat conservatively (no plan for kyphoplasty at this time). She had hyperkalemia and was volume overloaded initially due to missing dialysis due to pain; both now resolved with serial dialysis.  Assessment/Plan: 1. 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 Being managed with pain control/brace. No plan for kyphoplasty at this time. PT working with patient. 2. ESRD: HD MWF for now since volume much improved.  3. HTN/volume: BP low side; off hydralazine. Volume overload on admit, now s/p serial dialysis sessions with improved volume status. She is now roughly 3kg below her prior outpatient EDW, will need to be changed on discharge. 4. Clotted LUE dialysis access: Using femoral perm-cath. VVS consulted, for R arm/leg venogram today to determine site for new access. 5. Anemia: Hgb 10.9; stable without ESA. 6. Hyperkalemia: Resolved. 7. Secondary hyperparathyroidism: Severely  uncontrolled with last PTH > 4500. Continue current binders/VDRA/Sensipar dose for now and will adjust doses once taking meds consistently. Per Dr. Derrell Lollingamirez (gen surg) notes, they recommend evaluation in  tertiary care center for parathyroidectomy given severe cardiac disease/high surgical risk. 8. Non-ischemic cardiomyopathy (s/p AICD, EF 12-20%): On low dose coreg/isosorbide; off hydralazine. 9. Nutrition: Albumin 3; continue high protein diet and adding Nepro supplements. 10. [place]  [name] HoyleKatie Stovall, PA-C 07/31/2016, 9:20 AM  [facility] Pager: [phone]  I have seen and examined this patient and agree with plan and assessment in the above note with renal recommendations/interventions highlighted. Vascular access issues being addressed. Had upper extremity venogram today and was felt based on results to be candidate for a brahial axillary or ax-ax graft. Not sure what is planned in terms of timing - is my understanding that this needs to be in place before she can be sent home as is our usual requirement. Has a new outpt MWF HD spot in [facility] when ready to be discharged and hopefully her HD compliance will improve with this.       Marvelous Woolford B,MD 07/31/2016 4:29 PM

## 2016-07-31 NOTE — Progress Notes (Signed)
Physical Therapy Treatment Patient Details Name: [name] SorrowVictoria Bojarski MRN: [id number] DOB: [birth year] Today's Date: 07/31/2016    History of Present Illness Patient is a 25 yo female admitted 07/26/16 after a fall resulting in T7, T8, T12 fractures.  Ortho - conservative treatment with TLSO.  Patient also with acute pulmonary edema, hyperkalemia (missed HD), and has been non-ambulatory since fall 07/22/16.     PMH:  ESRD on HD, NICM, EF 12-20%, AICD, HTN, anemia, CHF, sickle cell    PT Comments    Noting very motivated to improve and be able to dc home; Gains in functional mobility and activity tolerance; Pain continuing to be an issue, but she worked through it today (premedicated with IV meds, which will not be available at home); Worth considering OT consult for ADLs with dc plan for home; paged Dr. Butler Denmarkizwan  Follow Up Recommendations  Home health PT;Supervision for mobility/OOB     Equipment Recommendations  3in1 (PT)    Recommendations for Other Services       Precautions / Restrictions Precautions Precautions: Fall;Back Precaution Comments: Reviewed back precautions for comfort/ease of movement Required Braces or Orthoses: Spinal Brace Spinal Brace: Thoracolumbosacral orthotic Restrictions Weight Bearing Restrictions: No    Mobility  Bed Mobility   Bed Mobility: Rolling;Sidelying to Sit Rolling: Supervision Sidelying to sit: Min assist       General bed mobility comments: Patient donned socks in supine.  Able to roll with use of rail.  Able to bring LE's off of bed.  Assist to bring trunk to sitting - patient using UE's to push up.  Able to maintain balance in sitting.  Max assist to don TLSO.   Transfers Overall transfer level: Needs assistance Equipment used: Rolling walker (2 wheeled) Transfers: Sit to/from Stand Sit to Stand: Min assist         General transfer comment: Verbal cues for hand placement.  Cues to push through LE's to stand.  Min assist to rise to  standing and for balance.  Cues to stand upright.    Ambulation/Gait Ambulation/Gait assistance: Min guard (with physical contact) Ambulation Distance (Feet): 40 Feet Assistive device: Rolling walker (2 wheeled) Gait Pattern/deviations: Step-to pattern;Step-through pattern Gait velocity: decreased   General Gait Details: Verbal cues for safe use of RW.  Assist to maneuver RW especially during turns.  Assist for safety/balance.  Patient with slow, guarded gait. Cues to incr step width for stability, and for focused breathing   Stairs            Wheelchair Mobility    Modified Rankin (Stroke Patients Only)       Balance     Sitting balance-Leahy Scale: Fair       Standing balance-Leahy Scale: Poor                      Cognition Arousal/Alertness: Awake/alert Behavior During Therapy: WFL for tasks assessed/performed Overall Cognitive Status: Within Functional Limits for tasks assessed                      Exercises      General Comments        Pertinent Vitals/Pain Pain Assessment: 0-10 Pain Score: 8  Pain Location: Back pain Pain Descriptors / Indicators: Aching;Grimacing Pain Intervention(s): Premedicated before session    Home Living                      Prior Function  PT Goals (current goals can now be found in the care plan section) Acute Rehab PT Goals Patient Stated Goal: To decrease pain PT Goal Formulation: With patient Time For Goal Achievement: 08/04/16 Potential to Achieve Goals: Good Progress towards PT goals: Progressing toward goals    Frequency    Min 5X/week      PT Plan Current plan remains appropriate    Co-evaluation             End of Session Equipment Utilized During Treatment: Back brace Activity Tolerance: Patient tolerated treatment well;Patient limited by pain Patient left: Other (comment);with call bell/phone within reach (On commode in bathroom)     Time:  1657-9038 PT Time Calculation (min) (ACUTE ONLY): 35 min  Charges:  $Gait Training: 23-37 mins                    G Codes:      Van Clines Hamff 07/31/2016, 10:00 AM  Van Clines, PT  Acute Rehabilitation Services Pager [phone] Office [phone]

## 2016-07-31 NOTE — Progress Notes (Signed)
  Progress Note    07/31/2016 3:05 PM * No surgery date entered *  Subjective:  No complaints today  Vitals:   07/31/16 0506 07/31/16 0913  BP: 116/78 112/84  Pulse: 87 89  Resp: 18 18  Temp: 98.3 F (36.8 C) 98.2 F (36.8 C)    Physical Exam: Cardiac:  rrr Lungs:  Non labored Extremities:  Palpable dp/pt bilaterally Abdomen:  Soft, ntnd  CBC    Component Value Date/Time   WBC 4.9 07/31/2016 0626   RBC 3.80 (L) 07/31/2016 0626   HGB 10.9 (L) 07/31/2016 0626   HCT 34.2 (L) 07/31/2016 0626   PLT 144 (L) 07/31/2016 0626   MCV 90.0 07/31/2016 0626   MCH 28.7 07/31/2016 0626   MCHC 31.9 07/31/2016 0626   RDW 18.7 (H) 07/31/2016 0626   LYMPHSABS 1.0 07/26/2016 1803   MONOABS 0.6 07/26/2016 1803   EOSABS 0.0 07/26/2016 1803   BASOSABS 0.0 07/26/2016 1803    BMET    Component Value Date/Time   NA 139 07/28/2016 0451   K 4.0 07/28/2016 0451   CL 95 (L) 07/28/2016 0451   CO2 26 07/28/2016 0451   GLUCOSE 89 07/28/2016 0451   BUN 38 (H) 07/28/2016 0451   CREATININE 6.20 (H) 07/28/2016 0451   CALCIUM 10.2 07/28/2016 0451   GFRNONAA 9 (L) 07/28/2016 0451   GFRAA 10 (L) 07/28/2016 0451    INR    Component Value Date/Time   INR 1.35 06/17/2016 0229     Intake/Output Summary (Last 24 hours) at 07/31/16 1505 Last data filed at 07/31/16 1400  Gross per 24 hour  Intake              530 ml  Output                0 ml  Net              530 ml     Assessment:  25 y.o. female is here with T spine fractures after fall, esrd currently dialyzing via left femoral tdc Plan: Will perform venogram RUE and RLE today Has been given premedication  Brandon C. Randie Heinz, MD [facility] Office: [phone] Pager: [phone]  07/31/2016 3:05 PM

## 2016-07-31 NOTE — Care Management Important Message (Signed)
Important Message  Patient Details  Name: [name] Small MRN: [id number] Date of Birth: 09/07/1991   Medicare Important Message Given:  Yes    Marlita Keil 07/31/2016, 12:11 PM

## 2016-07-31 NOTE — Progress Notes (Signed)
Dialysis treatment completed.  1000 mL ultrafiltrated.  500 mL net fluid removal.  Patient status unchanged. Lung sounds clear to ausculation in all fields. Generalized edema. Cardiac: NSR.  Cleansed L thigh catheter with chlorhexidine.  Disconnected lines and flushed ports with saline per protocol.  Ports locked with heparin and capped per protocol.    Report given to bedside, RN Lowella Bandy.

## 2016-07-31 NOTE — Progress Notes (Signed)
PROGRESS NOTE    [name] Small  ZOX:[id number] DOB: 10/17/1991 DOA: 07/26/2016  PCP: Jeanann Lewandowsky, MD   Brief Narrative:  [name] Small is a 25 y.o. female with ESRD on hemodialysis noncompliant with dialysis, cardiomyopathy status post AICD placement as had recent stress test done in September 26 which showed EF of 12% with reversible ischemia, hypertension presents to the ER because of increasing upper back pain after a recent fall and shortness of breath. Labs show hyperkalemia. Patient states after a fall she was not able to walk due to pain and had not gone to her dialysis for at least 2 sessions. EKG shows some prominent T waves. On-call nephrologist consulted and patient  taken for urgent dialysis. On exam patient complains of upper mid back pain and had come to the ER 2 days ago after a fall and x-rays at that time showed T8 compression fracture. Patient denies any incontinence of urine or bowels.   Subjective: Back pain is improving but it is still difficult for her to move without being in severe pain. Was able to stand with PT yesterday. No BM in 2 days. No dyspnea. No other complaints.   Assessment & Plan:   Principal Problem:   Acute pulmonary edema / volume overload/   ESRD on dialysis  - due to missing dialysis 2 x as she was in pain - appreciate Nephrology consult- attempting to find a new dialysis center for her  Active Problems:   Back pain- thoracic compression fractures - as a result of a fall in setting or osteoporosis - CT showed T7 fracture 18-19 %, subtle compression deformity of superior endplate of T8, probable acute/subacute fracture at T12  - pain control-   increased Percocet to 3 tabs QID, cont Naproxen and 1/2 the dose of PRN Dilaudid - I spoke with Dr Shon Baton, Ortho, who recommends conservative management for now with brace and pain rather than a kyphoplasty - if her hyperparathyroidism cannot be resolved via surgery, he will consider a more permanent  solution with a kyphoplasty - PRN Miralax for constipation  Secondary hyperparathyroidism - surgery consulted for parathyroidectomy- nephrology spoke with Dr Derrell Lolling who suggested that she would be a poor candidate for surgery due to the severity of her cardiomyopathy    Hyperkalemia - due to missing dialysis- has resolved after dialysis  Clotted Graft in LUE - currently has L thigh tunneled dialysis cath- vascular surgery consulted   - for permanent access on tomorrow    NICM (nonischemic cardiomyopathy) - EF 20-25 % - Coreg at 1/2 home dose (6.25) & Imdur- Hydralazine on hold for now- follow BP  Essential hypertension -  See above regarding meds  Acute thrombocytopenia - cause uncertain-receiving Heparin s/c- platelets were dropping daily- per nephrology, cont Heparin for now- checked HIT panel - platelets improved to144 yesterday   DVT prophylaxis: heparin Code Status: Full code Family Communication:  Disposition Plan: will d/c once access placed and dialysis center found Consultants:   Nephrology  IR Procedures:    Antimicrobials:  Anti-infectives    None     Objective: Vitals:   07/31/16 1554 07/31/16 1559 07/31/16 1745 07/31/16 1753  BP:   115/75 105/61  Pulse: (!) 185 (!) 0 98 94  Resp: (!) 0  17 19  Temp:      TempSrc:      SpO2: (!) 0%     Weight:   50.8 kg (111 lb 15.9 oz)   Height:  Intake/Output Summary (Last 24 hours) at 07/31/16 1819 Last data filed at 07/31/16 1400  Gross per 24 hour  Intake              410 ml  Output                0 ml  Net              410 ml   Filed Weights   07/28/16 2005 07/29/16 1106 07/31/16 1745  Weight: 47.5 kg (104 lb 11.5 oz) 51.3 kg (113 lb) 50.8 kg (111 lb 15.9 oz)    Examination: General exam: Appears uncomfortable due to pain HEENT: PERRLA, oral mucosa moist, no sclera icterus or thrush Respiratory system: Clear to auscultation. Respiratory effort normal. Cardiovascular system: S1 & S2 heard,  RRR.  No murmurs  Gastrointestinal system: Abdomen soft, non-tender, nondistended. Normal bowel sound. No organomegaly Back: tender along thoracic spine in T8 and T 12 vicinity Central nervous system: Alert and oriented. No focal neurological deficits. Extremities: No cyanosis, clubbing or edema Skin: No rashes or ulcers Psychiatry:  Mood & affect appropriate.     Data Reviewed: I have personally reviewed following labs and imaging studies  CBC:  Recent Labs Lab 07/26/16 1803  07/27/16 1140 07/27/16 1230 07/28/16 0451 07/30/16 0423 07/31/16 0626  WBC 7.4  --  5.9 6.0 6.0 7.1 4.9  NEUTROABS 5.8  --   --   --   --   --   --   HGB 11.4*  < > 10.2* 10.1* 10.2* 10.7* 10.9*  HCT 35.0*  < > 31.8* 31.9* 32.8* 34.4* 34.2*  MCV 89.1  --  88.3 88.1 89.1 90.3 90.0  PLT 219  --  138* 128* 116* 115* 144*  < > = values in this interval not displayed. Basic Metabolic Panel:  Recent Labs Lab 07/26/16 1803  07/26/16 2201 07/27/16 1140 07/27/16 1230 07/28/16 0451 07/31/16 1756  NA 137  < > 137 138 138 139 137  K 7.3*  < > 4.9 5.3* 5.2* 4.0 5.1  CL 99*  < > 99* 100* 99* 95* 97*  CO2 16*  --  21* 21* 21* 26 21*  GLUCOSE 96  < > 104* 95 97 89 122*  BUN 148*  < > 98* 85* 86* 38* 71*  CREATININE 14.96*  < > 10.67* 10.79* 10.34* 6.20* 8.63*  CALCIUM 9.9  --  9.9 10.0 10.1 10.2 8.3*  MG 2.4  --   --   --   --   --   --   PHOS  --   --  8.5*  --  9.0* 7.4* 8.2*  < > = values in this interval not displayed. GFR: Estimated Creatinine Clearance: 7.9 mL/min (by C-G formula based on SCr of 8.63 mg/dL (H)). Liver Function Tests:  Recent Labs Lab 07/26/16 1803 07/26/16 2201 07/27/16 1230 07/28/16 0451 07/31/16 1756  AST 19  --   --   --   --   ALT 8*  --   --   --   --   ALKPHOS 282*  --   --   --   --   BILITOT 1.1  --   --   --   --   PROT 7.9  --   --   --   --   ALBUMIN 3.5 3.7 3.0* 3.0* 3.0*   No results for input(s): LIPASE, AMYLASE in the last 168 hours. No results for  input(s): AMMONIA  in the last 168 hours. Coagulation Profile: No results for input(s): INR, PROTIME in the last 168 hours. Cardiac Enzymes: No results for input(s): CKTOTAL, CKMB, CKMBINDEX, TROPONINI in the last 168 hours. BNP (last 3 results) No results for input(s): PROBNP in the last 8760 hours. HbA1C: No results for input(s): HGBA1C in the last 72 hours. CBG: No results for input(s): GLUCAP in the last 168 hours. Lipid Profile: No results for input(s): CHOL, HDL, LDLCALC, TRIG, CHOLHDL, LDLDIRECT in the last 72 hours. Thyroid Function Tests: No results for input(s): TSH, T4TOTAL, FREET4, T3FREE, THYROIDAB in the last 72 hours. Anemia Panel: No results for input(s): VITAMINB12, FOLATE, FERRITIN, TIBC, IRON, RETICCTPCT in the last 72 hours. Urine analysis:    Component Value Date/Time   COLORURINE YELLOW 01/14/2013 1639   APPEARANCEUR CLOUDY (A) 01/14/2013 1639   LABSPEC 1.008 01/14/2013 1639   PHURINE 8.0 01/14/2013 1639   GLUCOSEU NEGATIVE 01/14/2013 1639   HGBUR SMALL (A) 01/14/2013 1639   BILIRUBINUR NEGATIVE 01/14/2013 1639   KETONESUR NEGATIVE 01/14/2013 1639   PROTEINUR 30 (A) 01/14/2013 1639   UROBILINOGEN 0.2 01/14/2013 1639   NITRITE NEGATIVE 01/14/2013 1639   LEUKOCYTESUR MODERATE (A) 01/14/2013 1639   Sepsis Labs: @LABRCNTIP (procalcitonin:4,lacticidven:4) ) Recent Results (from the past 240 hour(s))  MRSA PCR Screening     Status: None   Collection Time: 07/27/16  8:07 AM  Result Value Ref Range Status   MRSA by PCR NEGATIVE NEGATIVE Final    Comment:        The GeneXpert MRSA Assay (FDA approved for NASAL specimens only), is one component of a comprehensive MRSA colonization surveillance program. It is not intended to diagnose MRSA infection nor to guide or monitor treatment for MRSA infections.          Radiology Studies: No results found.    Scheduled Meds: . calcitRIOL  0.5 mcg Oral Q M,W,F-HD  . carvedilol  6.25 mg Oral BID WC  .  cinacalcet  30 mg Oral BID WC  . feeding supplement (NEPRO CARB STEADY)  237 mL Oral BID BM  . [START ON 08/01/2016] heparin  2,000 Units Dialysis Once in dialysis  . heparin  5,000 Units Subcutaneous Q8H  . hydrocerin   Topical BID  . isosorbide dinitrate  10 mg Oral TID  . multivitamin  1 tablet Oral QHS  . naproxen  500 mg Oral BID WC  . oxyCODONE  15 mg Oral QID  . sertraline  100 mg Oral Daily  . sevelamer carbonate  2,400 mg Oral TID WC   Continuous Infusions:    LOS: 5 days    Time spent in minutes: 35    Colvin Blatt, MD [facility] Pager: [email or web address] Password [email or web address] 07/31/2016, 6:19 PM

## 2016-08-01 ENCOUNTER — Telehealth: Payer: Self-pay

## 2016-08-01 ENCOUNTER — Encounter (HOSPITAL_COMMUNITY): Payer: Self-pay | Admitting: Vascular Surgery

## 2016-08-01 ENCOUNTER — Inpatient Hospital Stay (HOSPITAL_COMMUNITY): Payer: Medicare Other | Admitting: Anesthesiology

## 2016-08-01 ENCOUNTER — Encounter (HOSPITAL_COMMUNITY)
Admission: EM | Disposition: A | Discharge status: Home Health | Payer: Self-pay | Source: Home / Self Care | Attending: Internal Medicine

## 2016-08-01 DIAGNOSIS — N186 End stage renal disease: Secondary | ICD-10-CM

## 2016-08-01 DIAGNOSIS — E213 Hyperparathyroidism, unspecified: Secondary | ICD-10-CM

## 2016-08-01 DIAGNOSIS — M8000XD Age-related osteoporosis with current pathological fracture, unspecified site, subsequent encounter for fracture with routine healing: Secondary | ICD-10-CM

## 2016-08-01 DIAGNOSIS — Z992 Dependence on renal dialysis: Secondary | ICD-10-CM

## 2016-08-01 DIAGNOSIS — N2581 Secondary hyperparathyroidism of renal origin: Secondary | ICD-10-CM

## 2016-08-01 DIAGNOSIS — N185 Chronic kidney disease, stage 5: Secondary | ICD-10-CM

## 2016-08-01 DIAGNOSIS — M810 Age-related osteoporosis without current pathological fracture: Secondary | ICD-10-CM

## 2016-08-01 HISTORY — PX: AV FISTULA PLACEMENT: SHX1204

## 2016-08-01 LAB — CBC
HEMATOCRIT: 32.2 % — AB (ref 36.0–46.0)
HEMOGLOBIN: 10.1 g/dL — AB (ref 12.0–15.0)
MCH: 27.8 pg (ref 26.0–34.0)
MCHC: 31.4 g/dL (ref 30.0–36.0)
MCV: 88.7 fL (ref 78.0–100.0)
Platelets: 170 10*3/uL (ref 150–400)
RBC: 3.63 MIL/uL — AB (ref 3.87–5.11)
RDW: 18.8 % — ABNORMAL HIGH (ref 11.5–15.5)
WBC: 9.5 10*3/uL (ref 4.0–10.5)

## 2016-08-01 LAB — POCT I-STAT 4, (NA,K, GLUC, HGB,HCT)
GLUCOSE: 73 mg/dL (ref 65–99)
HEMATOCRIT: 35 % — AB (ref 36.0–46.0)
HEMOGLOBIN: 11.9 g/dL — AB (ref 12.0–15.0)
POTASSIUM: 3.9 mmol/L (ref 3.5–5.1)
SODIUM: 138 mmol/L (ref 135–145)

## 2016-08-01 SURGERY — INSERTION OF ARTERIOVENOUS (AV) GORE-TEX GRAFT ARM
Anesthesia: Monitor Anesthesia Care | Site: Arm Upper | Laterality: Right

## 2016-08-01 MED ORDER — SODIUM CHLORIDE 0.9 % IV SOLN
INTRAVENOUS | Status: DC | PRN
  Administered 2016-08-01: 500 mL

## 2016-08-01 MED ORDER — CEFAZOLIN SODIUM 1 G IJ SOLR
INTRAMUSCULAR | Status: DC | PRN
  Administered 2016-08-01: 2 g via INTRAMUSCULAR

## 2016-08-01 MED ORDER — MIDAZOLAM HCL 2 MG/2ML IJ SOLN
INTRAMUSCULAR | Status: DC | PRN
  Administered 2016-08-01: 2 mg via INTRAVENOUS

## 2016-08-01 MED ORDER — CALCITONIN (SALMON) 200 UNIT/ACT NA SOLN: 1.0000 | Freq: Every day | NASAL | 0 refills | Status: DC

## 2016-08-01 MED ORDER — VASOPRESSIN 20 UNIT/ML IV SOLN
INTRAVENOUS | Status: DC | PRN
  Administered 2016-08-01: 1 [IU] via INTRAVENOUS
  Administered 2016-08-01: 2 [IU] via INTRAVENOUS
  Administered 2016-08-01 (×6): 1 [IU] via INTRAVENOUS

## 2016-08-01 MED ORDER — ONDANSETRON HCL 4 MG/2ML IJ SOLN
INTRAMUSCULAR | Status: DC | PRN
  Administered 2016-08-01: 4 mg via INTRAVENOUS

## 2016-08-01 MED ORDER — LIDOCAINE-EPINEPHRINE (PF) 1 %-1:200000 IJ SOLN
INTRAMUSCULAR | Status: AC
  Filled 2016-08-01: qty 30

## 2016-08-01 MED ORDER — EPHEDRINE 5 MG/ML INJ
INTRAVENOUS | Status: AC
  Filled 2016-08-01: qty 10

## 2016-08-01 MED ORDER — MIDAZOLAM HCL 2 MG/2ML IJ SOLN
INTRAMUSCULAR | Status: AC
  Filled 2016-08-01: qty 2

## 2016-08-01 MED ORDER — PROPOFOL 10 MG/ML IV BOLUS
INTRAVENOUS | Status: DC | PRN
  Administered 2016-08-01: 200 mg via INTRAVENOUS

## 2016-08-01 MED ORDER — FAMOTIDINE 20 MG PO TABS: 20.0000 mg | ORAL_TABLET | Freq: Two times a day (BID) | ORAL | 0 refills | Status: DC | PRN

## 2016-08-01 MED ORDER — HYDROMORPHONE HCL 1 MG/ML IJ SOLN: 0.2500 mg | INTRAMUSCULAR | Status: DC | PRN

## 2016-08-01 MED ORDER — OXYCODONE HCL 5 MG/5ML PO SOLN: 15.0000 mg | Freq: Once | ORAL | Status: DC | PRN

## 2016-08-01 MED ORDER — SEVELAMER CARBONATE 800 MG PO TABS: 1600.0000 mg | ORAL_TABLET | ORAL | 0 refills | Status: DC | PRN

## 2016-08-01 MED ORDER — VASOPRESSIN 20 UNIT/ML IV SOLN
INTRAVENOUS | Status: AC
  Filled 2016-08-01: qty 1

## 2016-08-01 MED ORDER — OXYCODONE HCL 5 MG PO TABS: 15.0000 mg | ORAL_TABLET | Freq: Once | ORAL | Status: DC | PRN

## 2016-08-01 MED ORDER — EPHEDRINE SULFATE 50 MG/ML IJ SOLN
INTRAMUSCULAR | Status: DC | PRN
  Administered 2016-08-01: 10 mg via INTRAVENOUS

## 2016-08-01 MED ORDER — PHENYLEPHRINE HCL 10 MG/ML IJ SOLN
INTRAMUSCULAR | Status: DC | PRN
  Administered 2016-08-01: 50 ug/min via INTRAVENOUS

## 2016-08-01 MED ORDER — PROTAMINE SULFATE 10 MG/ML IV SOLN
INTRAVENOUS | Status: DC | PRN
  Administered 2016-08-01: 10 mg via INTRAVENOUS
  Administered 2016-08-01: 20 mg via INTRAVENOUS

## 2016-08-01 MED ORDER — PHENYLEPHRINE HCL 10 MG/ML IJ SOLN
INTRAMUSCULAR | Status: DC | PRN
  Administered 2016-08-01 (×2): 240 ug via INTRAVENOUS

## 2016-08-01 MED ORDER — PAPAVERINE HCL 30 MG/ML IJ SOLN
INTRAMUSCULAR | Status: AC
  Filled 2016-08-01: qty 2

## 2016-08-01 MED ORDER — ONDANSETRON HCL 4 MG/2ML IJ SOLN: 4.0000 mg | Freq: Once | INTRAMUSCULAR | Status: DC | PRN

## 2016-08-01 MED ORDER — LIDOCAINE HCL (PF) 1 % IJ SOLN
INTRAMUSCULAR | Status: AC
  Filled 2016-08-01: qty 30

## 2016-08-01 MED ORDER — NAPROXEN 500 MG PO TABS: 500.0000 mg | ORAL_TABLET | Freq: Two times a day (BID) | ORAL | 0 refills | Status: DC

## 2016-08-01 MED ORDER — SODIUM CHLORIDE 0.9 % IJ SOLN
INTRAMUSCULAR | Status: DC | PRN
  Administered 2016-08-01: 2 mL

## 2016-08-01 MED ORDER — 0.9 % SODIUM CHLORIDE (POUR BTL) OPTIME
TOPICAL | Status: DC | PRN
  Administered 2016-08-01: 1000 mL

## 2016-08-01 MED ORDER — HEPARIN SODIUM (PORCINE) 5000 UNIT/ML IJ SOLN
5000.0000 [IU] | Freq: Three times a day (TID) | INTRAMUSCULAR | Status: DC
  Filled 2016-08-01 (×4): qty 1

## 2016-08-01 MED ORDER — SEVELAMER CARBONATE 800 MG PO TABS: 2400.0000 mg | ORAL_TABLET | Freq: Three times a day (TID) | ORAL | 0 refills | Status: DC

## 2016-08-01 MED ORDER — DARBEPOETIN ALFA 40 MCG/0.4ML IJ SOSY
40.0000 ug | PREFILLED_SYRINGE | INTRAMUSCULAR | Status: DC
  Filled 2016-08-01: qty 0.4

## 2016-08-01 MED ORDER — THROMBIN 20000 UNITS EX SOLR
CUTANEOUS | Status: AC
  Filled 2016-08-01: qty 20000

## 2016-08-01 MED ORDER — CARVEDILOL 6.25 MG PO TABS: 6.1250 mg | ORAL_TABLET | Freq: Two times a day (BID) | ORAL | 0 refills | Status: DC

## 2016-08-01 MED ORDER — CINACALCET HCL 30 MG PO TABS: 30.0000 mg | ORAL_TABLET | Freq: Two times a day (BID) | ORAL | 0 refills | Status: DC

## 2016-08-01 MED ORDER — CALCITRIOL 0.5 MCG PO CAPS: 0.5000 ug | ORAL_CAPSULE | ORAL | 0 refills | Status: DC

## 2016-08-01 MED ORDER — FENTANYL CITRATE (PF) 100 MCG/2ML IJ SOLN
INTRAMUSCULAR | Status: DC | PRN
  Administered 2016-08-01: 50 ug via INTRAVENOUS
  Administered 2016-08-01 (×2): 25 ug via INTRAVENOUS
  Administered 2016-08-01: 50 ug via INTRAVENOUS

## 2016-08-01 MED ORDER — OXYCODONE HCL 15 MG PO TABS: 15.0000 mg | ORAL_TABLET | Freq: Four times a day (QID) | ORAL | 0 refills | Status: DC | PRN

## 2016-08-01 MED ORDER — SODIUM CHLORIDE 0.9 % IV SOLN
INTRAVENOUS | Status: DC
  Administered 2016-08-01 (×2): via INTRAVENOUS

## 2016-08-01 MED ORDER — HEPARIN SODIUM (PORCINE) 1000 UNIT/ML IJ SOLN
INTRAMUSCULAR | Status: DC | PRN
  Administered 2016-08-01: 5000 [IU] via INTRAVENOUS

## 2016-08-01 MED ORDER — FENTANYL CITRATE (PF) 100 MCG/2ML IJ SOLN
INTRAMUSCULAR | Status: AC
  Filled 2016-08-01: qty 2

## 2016-08-01 MED ORDER — PHENYLEPHRINE 40 MCG/ML (10ML) SYRINGE FOR IV PUSH (FOR BLOOD PRESSURE SUPPORT)
PREFILLED_SYRINGE | INTRAVENOUS | Status: AC
  Filled 2016-08-01: qty 10

## 2016-08-01 SURGICAL SUPPLY — 33 items
ARMBAND PINK RESTRICT EXTREMIT (MISCELLANEOUS) ×2 IMPLANT
CANISTER SUCTION 2500CC (MISCELLANEOUS) ×2 IMPLANT
CANNULA VESSEL 3MM 2 BLNT TIP (CANNULA) ×2 IMPLANT
CLIP TI MEDIUM 6 (CLIP) ×2 IMPLANT
CLIP TI WIDE RED SMALL 6 (CLIP) ×2 IMPLANT
DECANTER SPIKE VIAL GLASS SM (MISCELLANEOUS) IMPLANT
DERMABOND ADVANCED (GAUZE/BANDAGES/DRESSINGS) ×1
DERMABOND ADVANCED .7 DNX12 (GAUZE/BANDAGES/DRESSINGS) ×1 IMPLANT
ELECT REM PT RETURN 9FT ADLT (ELECTROSURGICAL) ×2
ELECTRODE REM PT RTRN 9FT ADLT (ELECTROSURGICAL) ×1 IMPLANT
GLOVE BIO SURGEON STRL SZ 6.5 (GLOVE) ×4 IMPLANT
GLOVE BIO SURGEON STRL SZ7.5 (GLOVE) ×2 IMPLANT
GLOVE BIOGEL PI IND STRL 6.5 (GLOVE) ×2 IMPLANT
GLOVE BIOGEL PI IND STRL 8 (GLOVE) ×1 IMPLANT
GLOVE BIOGEL PI INDICATOR 6.5 (GLOVE) ×2
GLOVE BIOGEL PI INDICATOR 8 (GLOVE) ×1
GLOVE SURG SS PI 6.5 STRL IVOR (GLOVE) ×2 IMPLANT
GOWN STRL REUS W/ TWL LRG LVL3 (GOWN DISPOSABLE) ×3 IMPLANT
GOWN STRL REUS W/TWL LRG LVL3 (GOWN DISPOSABLE) ×3
GRAFT GORETEX STRT 4-7X45 (Vascular Products) ×2 IMPLANT
KIT BASIN OR (CUSTOM PROCEDURE TRAY) ×2 IMPLANT
KIT ROOM TURNOVER OR (KITS) ×2 IMPLANT
LIQUID BAND (GAUZE/BANDAGES/DRESSINGS) ×2 IMPLANT
NS IRRIG 1000ML POUR BTL (IV SOLUTION) ×2 IMPLANT
PACK CV ACCESS (CUSTOM PROCEDURE TRAY) ×2 IMPLANT
PAD ARMBOARD 7.5X6 YLW CONV (MISCELLANEOUS) ×4 IMPLANT
SPONGE SURGIFOAM ABS GEL 100 (HEMOSTASIS) IMPLANT
SUT PROLENE 6 0 BV (SUTURE) ×6 IMPLANT
SUT VIC AB 3-0 SH 27 (SUTURE) ×1
SUT VIC AB 3-0 SH 27X BRD (SUTURE) ×1 IMPLANT
SUT VICRYL 4-0 PS2 18IN ABS (SUTURE) ×2 IMPLANT
UNDERPAD 30X30 (UNDERPADS AND DIAPERS) ×2 IMPLANT
WATER STERILE IRR 1000ML POUR (IV SOLUTION) IMPLANT

## 2016-08-01 NOTE — Anesthesia Procedure Notes (Signed)
Procedure Name: LMA Insertion Date/Time: 08/01/2016 3:26 PM Performed by: Rosiland Oz Pre-anesthesia Checklist: Patient identified, Emergency Drugs available, Patient being monitored, Timeout performed and Suction available Patient Re-evaluated:Patient Re-evaluated prior to inductionOxygen Delivery Method: Circle system utilized Preoxygenation: Pre-oxygenation with 100% oxygen Intubation Type: IV induction Ventilation: Mask ventilation without difficulty LMA: LMA inserted LMA Size: 4.0 Number of attempts: 1 Placement Confirmation: positive ETCO2 and breath sounds checked- equal and bilateral Tube secured with: Tape Dental Injury: Teeth and Oropharynx as per pre-operative assessment

## 2016-08-01 NOTE — Anesthesia Preprocedure Evaluation (Addendum)
Anesthesia Evaluation  Patient identified by MRN, date of birth, ID band Patient awake    Reviewed: Allergy & Precautions, NPO status , Patient's Chart, lab work & pertinent test results, reviewed documented beta blocker date and time   Airway Mallampati: II  TM Distance: >3 FB     Dental  (+) Chipped   Pulmonary former smoker, PE PE Hx   Pulmonary exam normal        Cardiovascular hypertension, Pt. on medications and Pt. on home beta blockers +CHF  Normal cardiovascular exam+ dysrhythmias + Cardiac Defibrillator   EF is 10-20% from NICM   Neuro/Psych Anxiety Depression    GI/Hepatic Neg liver ROS, GERD  Medicated and Controlled,  Endo/Other  negative endocrine ROS  Renal/GU ESRF and DialysisRenal disease  negative genitourinary   Musculoskeletal   Abdominal Normal abdominal exam  (+)   Peds  Hematology  (+) anemia ,   Anesthesia Other Findings   Reproductive/Obstetrics                            Anesthesia Physical  Anesthesia Plan  ASA: IV  Anesthesia Plan: MAC   Post-op Pain Management:    Induction: Intravenous  Airway Management Planned: Simple Face Mask  Additional Equipment:   Intra-op Plan:   Post-operative Plan:   Informed Consent: I have reviewed the patients History and Physical, chart, labs and discussed the procedure including the risks, benefits and alternatives for the proposed anesthesia with the patient or authorized representative who has indicated his/her understanding and acceptance.   Dental advisory given  Plan Discussed with: CRNA and Surgeon  Anesthesia Plan Comments:        Anesthesia Quick Evaluation

## 2016-08-01 NOTE — Transfer of Care (Signed)
Immediate Anesthesia Transfer of Care Note  Patient: [name] Small  Procedure(s) Performed: Procedure(s): INSERTION OF ARTERIOVENOUS (AV) GORE-TEX GRAFT RIGHT ARM (Right)  Patient Location: PACU  Anesthesia Type:General  Level of Consciousness: awake, alert  and patient cooperative  Airway & Oxygen Therapy: Patient Spontanous Breathing and Patient connected to nasal cannula oxygen  Post-op Assessment: Report given to RN and Post -op Vital signs reviewed and stable  Post vital signs: Reviewed and stable  Last Vitals:  Vitals:   08/01/16 1005 08/01/16 1723  BP: 117/88 91/63  Pulse: 98 93  Resp: 16 12  Temp: 37.1 C 36.9 C    Last Pain:  Vitals:   08/01/16 1005  TempSrc: Oral  PainSc:       Patients Stated Pain Goal: 0 (07/31/16 0419)  Complications: No apparent anesthesia complications

## 2016-08-01 NOTE — Discharge Summary (Signed)
Physician Discharge Summary  [name] Small ZOX:[id number] DOB: [birth year] DOA: 07/26/2016  PCP: Jeanann Lewandowsky, MD  Admit date: 07/26/2016 Discharge date: 08/01/2016  Admitted From: home  Disposition:  home   Recommendations for Outpatient Follow-up:  1. refferal to tertiary care center for parathyroidectomy 2. Endocrine referral  Home Health: RN, aid, PT. 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 No constipation.   Brief Summary: [name] Small a 25 y.o.femalewith ESRD on hemodialysis noncompliant with dialysis, cardiomyopathy status post AICD placement as had recent stress test done in September 26 which showed EF of 12% with reversible ischemia, hypertension presents to the ER because of increasing upper back pain after a recent fall and shortness of breath. Labs show hyperkalemia. Patient states after a fall she was not able to walk due topain and had not gone to her dialysis for at least 2 sessions. EKG shows some prominent T waves. On-call nephrologist consulted and patient  taken for urgent dialysis. On exam patient complains of upper mid back pain and had come to the ER 2 days ago after a fall and x-rays at that time showed T8 compression fracture. Patient denies any incontinence of urine or bowels.   Hospital Course:  Principal Problem:  Acute pulmonary edema / volume overload/   ESRD on dialysis  - due to missing dialysis 2 x as she was in pain - appreciate Nephrology consult- have found a new dialysis center in [facility] with her  Active Problems:   Back pain- thoracic compression fractures - as a result of a fall in setting or osteoporosis - CT showed T7 fracture 18-19 %, subtle compression deformity of superior endplate of T8, probable acute/subacute fracture at T12  - pain control- increased Percocet to 3 tabs QID, cont Naproxen (PEPCID PRN with this) -  she is advised that she will need to wean Percocet as pain improves - I spoke with Dr Shon Baton, Ortho, twice who recommends conservative management for now with brace and pain control rather than a kyphoplasty  - he is recommending Calcitonin nasal spray for pain control to be use over the next 2-3 wks - PRN Miralax for constipation- she states she is having regular BMs  Secondary hyperparathyroidism - surgery consulted for parathyroidectomy- I spoke with Dr Derrell Lolling who suggested that she would be a poor candidate for surgery due to the severity of her cardiomyopathy and recommended a referral to a tertiary care center - can also obtain and endocrine referral as outpt    Hyperkalemia - due to missing dialysis- has resolved after dialysis  Clotted Graft in LUE - currently has L thigh tunneled dialysis cath- vascular surgery consulted   - right arm AV graft performed today    NICM (nonischemic cardiomyopathy) - EF 20-25 % per last ECHO and 12 % per last cath - Coreg at 1/2 home dose (6.25) and lower dose of Hydralazine  from 25 to 10 mg TID- cont Imdur  Essential hypertension -  See above regarding meds  Acute thrombocytopenia - cause uncertain-receiving Heparin s/c- platelets were dropping daily- see labs below- per nephrology, cont Heparin for now- checked HIT panel -without intervention,  platelets steadly improved to 170 today    Discharge  Instructions  Discharge Instructions    Discharge instructions    Complete by:  As directed    Renal heart healthy diet   Increase activity slowly    Complete by:  As directed        Medication List    STOP taking these medications   hydrALAZINE 25 MG tablet Commonly known as:  APRESOLINE   isosorbide dinitrate 10 MG tablet Commonly known as:  ISORDIL     TAKE these medications   calcitonin (salmon) 200 UNIT/ACT nasal spray Commonly known as:  MIACALCIN Place 1 spray into alternate nostrils daily.   calcitRIOL 0.5 MCG capsule Commonly known as:  ROCALTROL Take 1 capsule (0.5 mcg total) by mouth every Monday, Wednesday, and Friday with hemodialysis. Start taking on:  08/02/2016   carvedilol 6.25 MG tablet Commonly known as:  COREG Take 1 tablet (6.25 mg total) by mouth 2 (two) times daily with a meal. What changed:  medication strength  how much to take  when to take this   cinacalcet 30 MG tablet Commonly known as:  SENSIPAR Take 1 tablet (30 mg total) by mouth 2 (two) times daily with a meal.   colchicine 0.6 MG tablet Take 0.5 tablet (0.3 mg total) by mouth two times per week. What changed:  how much to take  how to take this  when to take this  additional instructions   famotidine 20 MG tablet Commonly known as:  PEPCID Take 1 tablet (20 mg total) by mouth 2 (two) times daily as needed for heartburn or indigestion.   LORazepam 1 MG tablet Commonly known as:  ATIVAN Take 1 tablet (1 mg total) by mouth daily as needed for anxiety. What changed:  when to take this   multivitamin Tabs tablet Take 1 tablet by mouth daily.   naproxen 500 MG tablet Commonly known as:  NAPROSYN Take 1 tablet (500 mg total) by mouth 2 (two) times daily with a meal.   oxyCODONE 15 MG immediate release tablet Commonly known as:  ROXICODONE Take 1 tablet (15 mg total) by mouth every 6 (six) hours as needed for severe pain. You must try to cut back on the frequency of  these pills as your pain improves What changed:  medication strength  how much to take  additional instructions   sertraline 100 MG tablet Commonly known as:  ZOLOFT Take 1 tablet (100 mg total) by mouth daily.   sevelamer carbonate 800 MG tablet Commonly known as:  RENVELA Take 2-3 tablets (1,600-2,400 mg total) by mouth 3 (three) times daily with meals. 3 tabs with meals, 2 tabs with snacks What changed:  when to take this  additional instructions      Follow-up Information    [facility] .   Why:  Home Health Services RN,PT/OT/ Home Health Aide Contact information: [address] [place] [address] [phone]        [facility] - [facility] .   Why:  Bedside Commode delivered to room prior to discharge home Contact information: [address] [place] [address] [phone]        [facility] .   Why:  Hospital followup appointment Thursday, August 04, 2015 @ 3:30pm. Please call if you are unable to keep this appointment.  Contact information: [address] [place] [address] [phone]       [facility] .   Why:  Appointment Sep 06, 2015  Contact information: [address] [place] [address]       Waverly Ferrari, MD In 2 weeks.   Specialties:  Vascular Surgery, Cardiology Why:  Office will call you to arrange your appt (sent) Contact information: [address] [place] [address] [phone]          Allergies  Allergen Reactions  . Aspirin Other (See Comments)    Interacts with Coreg  . Morphine And Related Itching and Other (See Comments)    Ok with oxycodone   . Tramadol Anaphylaxis  . Vicodin [Hydrocodone-Acetaminophen] Hives  . Buprenorphine Hcl Itching and Other (See Comments)    Ok with oxycodone  . Iohexol Itching     Procedures/Studies:  AV graft right arm 10/10  Dg Chest  2 View  Result Date: 07/26/2016 CLINICAL DATA:  Shortness of breath and chest pain with history of recent rib fractures EXAM: CHEST  2 VIEW COMPARISON:  07/23/2016 FINDINGS: Cardiac shadow remains enlarged. A defibrillator is again seen. The lungs are well aerated bilaterally. Previously seen left basilar changes have improved in the interval from the prior exam. No sizable effusion is seen. No acute bony abnormality is noted. IMPRESSION: Improved aeration in the left lung base when compare with the prior exam. No new focal abnormality is seen. Electronically Signed   By: Alcide Clever M.D.   On: 07/26/2016 18:53   Dg Ribs Unilateral W/chest Right  Result Date: 07/23/2016 CLINICAL DATA:  Fall at home with right rib pain. Initial encounter. EXAM: RIGHT RIBS AND CHEST - 3+ VIEW COMPARISON:  07/01/2016 FINDINGS: Single chamber pacer/ICD battery pack obscures some of the right ribs. A marker is present more inferiorly and these levels are negative for acute fracture. No hemothorax or pneumothorax on the right. There is infrahilar opacity on the left with small pleural effusion. Chronic marked cardiopericardial enlargement and pulmonary venous congestion. IVC filter. IMPRESSION: 1. Negative for fracture along the symptomatic lower right ribs. No pneumothorax. 2. Left lower lobe atelectasis or pneumonia with small pleural effusion. 3. Chronic marked cardiomegaly and pulmonary venous congestion. Electronically Signed   By: Marnee Spring M.D.   On: 07/23/2016 21:53   Dg Thoracic Spine 2 View  Result Date: 07/24/2016 CLINICAL DATA:  25 y/o  F; fall at home with severe mid back pain. EXAM: THORACIC SPINE 2 VIEWS COMPARISON:  06/17/2016 CT of the chest. FINDINGS: Single lead AICD. Mild S-shaped curvature of thoracic spine. IVC filter. Mild anterior loss of height of a mid thoracic vertebral body, possibly T8, appears new from prior CT. IMPRESSION: Suspected new mild anterior compression deformity of T8. Confirmation  with thoracic CT or MRI is recommended. These results will be called to the ordering clinician or representative by the Radiologist Assistant, and communication documented in the PACS or zVision Dashboard. Electronically Signed   By: Mitzi Hansen M.D.   On: 07/24/2016 00:38   Ct Thoracic Spine Wo Contrast  Result Date: 07/27/2016 CLINICAL DATA:  25 year old female with progressive upper back pain and shortness of breath after falling. EXAM: CT THORACIC SPINE WITHOUT CONTRAST TECHNIQUE: Multidetector CT imaging of the thoracic spine was performed without intravenous contrast  administration. Multiplanar CT image reconstructions were also generated. COMPARISON:  Prior chest CT 06/17/2016. Prior CT scan of the abdomen and pelvis 10/26/2015 FINDINGS: Alignment: Slightly exaggerated thoracic kyphosis. No scoliotic curvature. Vertebrae: Acute nondisplaced fracture through the posterior aspect of the right fifth rib. Subtle compression fracture of the anterior aspect of T7 with buckling of the cortex. There is approximately 18- 19% height loss anteriorly. No posterior retropulsion. Findings are new compared to prior chest CT 06/17/2016. Very subtle probable compression deformity of the superior endplate of T8 without height loss. Additionally, there is subtle buckling of the anterior cortex at T12 which is less specific but new compared to October 26, 2015. No associated height loss. Diffuse demineralization of the bones suggests osteoporosis. Paraspinal and other soft tissues: No paraspinal mass or hematoma. Disc levels: Moderately large right pleural effusion. Incompletely imaged right subclavian approach intracardiac defibrillator. Cardiomegaly. Moderate pericardial effusion. Incompletely imaged IVC filter. Numerous bilateral renal cysts and calcifications of varying complexity consistent with the clinical history of polycystic kidney disease. IMPRESSION: 1. Acute to subacute fracture of T7 with 18-19%  anterior height loss. No evidence of posterior retropulsion. 2. Suspect subtle compression deformity of the superior endplate of T8 without associated height loss. 3. Similarly, probable acute to subacute fracture involving the anterior cortex of T12 without evidence of associated height loss. This finding is new compared to October 26, 2015 and has likely occurred at some point since that time. 4. Diffusely abnormal bone and marrow density with areas of relatively increased sclerosis and osteopenia. Findings may represent aggressive osteopenia, or renal osteodystrophy. 5. Moderately large right pleural effusion without significant interval change compared 06/17/2016. 6. Nondisplaced fracture the posterior aspect of the right fifth rib. 7. Cardiomegaly. 8. Moderate pericardial effusion. 9. Numerous renal cysts of varying complexity bilaterally. Electronically Signed   By: Malachy Moan M.D.   On: 07/27/2016 10:22       Discharge Exam: Vitals:   08/01/16 1005 08/01/16 1723  BP: 117/88 91/63  Pulse: 98 93  Resp: 16 12  Temp: 98.8 F (37.1 C) 98.4 F (36.9 C)   Vitals:   07/31/16 2249 08/01/16 0531 08/01/16 1005 08/01/16 1723  BP: 102/64 103/74 117/88 91/63  Pulse: 97 98 98 93  Resp: 15 16 16 12   Temp: 98.4 F (36.9 C) 98.7 F (37.1 C) 98.8 F (37.1 C) 98.4 F (36.9 C)  TempSrc: Oral Oral Oral   SpO2: 95% 100% 100% 97%  Weight: 50.4 kg (111 lb 1.8 oz)     Height:        General: Pt is alert, awake, not in acute distress Cardiovascular: RRR, S1/S2 +, no rubs, no gallops Respiratory: CTA bilaterally, no wheezing, no rhonchi Abdominal: Soft, NT, ND, bowel sounds + Extremities: no edema, no cyanosis    The results of significant diagnostics from this hospitalization (including imaging, microbiology, ancillary and laboratory) are listed below for reference.     Microbiology: Recent Results (from the past 240 hour(s))  MRSA PCR Screening     Status: None   Collection Time:  07/27/16  8:07 AM  Result Value Ref Range Status   MRSA by PCR NEGATIVE NEGATIVE Final    Comment:        The GeneXpert MRSA Assay (FDA approved for NASAL specimens only), is one component of a comprehensive MRSA colonization surveillance program. It is not intended to diagnose MRSA infection nor to guide or monitor treatment for MRSA infections.      Labs: BNP (last 3  results)  Recent Labs  09/29/15 1242 04/11/16 1130 07/26/16 1803  BNP 2,492.9* >4,500.0* >4,500.0*   Basic Metabolic Panel:  Recent Labs Lab 07/26/16 1803  07/26/16 2201 07/27/16 1140 07/27/16 1230 07/28/16 0451 07/31/16 1756 08/01/16 1421  NA 137  < > 137 138 138 139 137 138  K 7.3*  < > 4.9 5.3* 5.2* 4.0 5.1 3.9  CL 99*  < > 99* 100* 99* 95* 97*  --   CO2 16*  --  21* 21* 21* 26 21*  --   GLUCOSE 96  < > 104* 95 97 89 122* 73  BUN 148*  < > 98* 85* 86* 38* 71*  --   CREATININE 14.96*  < > 10.67* 10.79* 10.34* 6.20* 8.63*  --   CALCIUM 9.9  --  9.9 10.0 10.1 10.2 8.3*  --   MG 2.4  --   --   --   --   --   --   --   PHOS  --   --  8.5*  --  9.0* 7.4* 8.2*  --   < > = values in this interval not displayed. Liver Function Tests:  Recent Labs Lab 07/26/16 1803 07/26/16 2201 07/27/16 1230 07/28/16 0451 07/31/16 1756  AST 19  --   --   --   --   ALT 8*  --   --   --   --   ALKPHOS 282*  --   --   --   --   BILITOT 1.1  --   --   --   --   PROT 7.9  --   --   --   --   ALBUMIN 3.5 3.7 3.0* 3.0* 3.0*   No results for input(s): LIPASE, AMYLASE in the last 168 hours. No results for input(s): AMMONIA in the last 168 hours. CBC:  Recent Labs Lab 07/26/16 1803  07/27/16 1230 07/28/16 0451 07/30/16 0423 07/31/16 0626 08/01/16 0503 08/01/16 1421  WBC 7.4  < > 6.0 6.0 7.1 4.9 9.5  --   NEUTROABS 5.8  --   --   --   --   --   --   --   HGB 11.4*  < > 10.1* 10.2* 10.7* 10.9* 10.1* 11.9*  HCT 35.0*  < > 31.9* 32.8* 34.4* 34.2* 32.2* 35.0*  MCV 89.1  < > 88.1 89.1 90.3 90.0 88.7  --   PLT  219  < > 128* 116* 115* 144* 170  --   < > = values in this interval not displayed. Cardiac Enzymes: No results for input(s): CKTOTAL, CKMB, CKMBINDEX, TROPONINI in the last 168 hours. BNP: Invalid input(s): POCBNP CBG: No results for input(s): GLUCAP in the last 168 hours. D-Dimer No results for input(s): DDIMER in the last 72 hours. Hgb A1c No results for input(s): HGBA1C in the last 72 hours. Lipid Profile No results for input(s): CHOL, HDL, LDLCALC, TRIG, CHOLHDL, LDLDIRECT in the last 72 hours. Thyroid function studies No results for input(s): TSH, T4TOTAL, T3FREE, THYROIDAB in the last 72 hours.  Invalid input(s): FREET3 Anemia work up No results for input(s): VITAMINB12, FOLATE, FERRITIN, TIBC, IRON, RETICCTPCT in the last 72 hours. Urinalysis    Component Value Date/Time   COLORURINE YELLOW 01/14/2013 1639   APPEARANCEUR CLOUDY (A) 01/14/2013 1639   LABSPEC 1.008 01/14/2013 1639   PHURINE 8.0 01/14/2013 1639   GLUCOSEU NEGATIVE 01/14/2013 1639   HGBUR SMALL (A) 01/14/2013 1639   BILIRUBINUR NEGATIVE 01/14/2013 1639  KETONESUR NEGATIVE 01/14/2013 1639   PROTEINUR 30 (A) 01/14/2013 1639   UROBILINOGEN 0.2 01/14/2013 1639   NITRITE NEGATIVE 01/14/2013 1639   LEUKOCYTESUR MODERATE (A) 01/14/2013 1639   Sepsis Labs Invalid input(s): PROCALCITONIN,  WBC,  LACTICIDVEN Microbiology Recent Results (from the past 240 hour(s))  MRSA PCR Screening     Status: None   Collection Time: 07/27/16  8:07 AM  Result Value Ref Range Status   MRSA by PCR NEGATIVE NEGATIVE Final    Comment:        The GeneXpert MRSA Assay (FDA approved for NASAL specimens only), is one component of a comprehensive MRSA colonization surveillance program. It is not intended to diagnose MRSA infection nor to guide or monitor treatment for MRSA infections.      Time coordinating discharge: Over 30 minutes  SIGNED:   Calvert CantorIZWAN,Imoni Kohen, MD  [facility] 08/01/2016, 5:41 PM Pager   If  7PM-7AM, please contact night-coverage [email or web address] Password [email or web address]

## 2016-08-01 NOTE — Progress Notes (Signed)
Physical Therapy Treatment Patient Details Name: [name] Small MRN: [id number] DOB: 20-Jul-1991 Today's Date: 08/01/2016    History of Present Illness Patient is a 25 yo female admitted 07/26/16 after a fall resulting in T7, T8, T12 fractures.  Ortho - conservative treatment with TLSO.  Patient also with acute pulmonary edema, hyperkalemia (missed HD), and has been non-ambulatory since fall 07/22/16.     PMH:  ESRD on HD, NICM, EF 12-20%, AICD, HTN, anemia, CHF, sickle cell    PT Comments    Continuing progress with functional mobility and activity tolerance; Very motivated and reports she practiced walking with RW last night; Reinforced the need for having the brace on when walking; Worth considering OT consult for ADLs   Follow Up Recommendations  Home health PT;Supervision for mobility/OOB     Equipment Recommendations  3in1 (PT)    Recommendations for Other Services OT consult     Precautions / Restrictions Precautions Precautions: Fall;Back Precaution Comments: Reviewed back precautions for comfort/ease of movement Required Braces or Orthoses: Spinal Brace Spinal Brace: Thoracolumbosacral orthotic Restrictions Weight Bearing Restrictions: No    Mobility  Bed Mobility Overal bed mobility: Needs Assistance Bed Mobility: Rolling;Sidelying to Sit Rolling: Supervision Sidelying to sit: Min guard       General bed mobility comments: Cues for technique; Used bed rails to push up, minguard for safety; no physical assist needed  Transfers Overall transfer level: Needs assistance Equipment used: Rolling walker (2 wheeled) Transfers: Sit to/from Stand Sit to Stand: Min assist         General transfer comment: Verbal cues for hand placement.  Cues to push through LE's to stand.  Minguard assist  for balance.  Cues to stand upright.    Ambulation/Gait Ambulation/Gait assistance: Min guard Ambulation Distance (Feet): 50 Feet Assistive device: Rolling walker (2  wheeled) Gait Pattern/deviations: Step-through pattern Gait velocity: decreased Gait velocity interpretation: Below normal speed for age/gender General Gait Details: Verbal cues for safe use of RW. Needing less  Assist to maneuver RW .  Assist for safety/balance.  Patient with slow, guarded gait. Cues to incr step width for stability, and for focused breathing   Stairs            Wheelchair Mobility    Modified Rankin (Stroke Patients Only)       Balance     Sitting balance-Leahy Scale: Fair       Standing balance-Leahy Scale: Poor                      Cognition Arousal/Alertness: Awake/alert Behavior During Therapy: WFL for tasks assessed/performed Overall Cognitive Status: Within Functional Limits for tasks assessed                      Exercises      General Comments        Pertinent Vitals/Pain Pain Assessment: 0-10 Pain Score: 7  Pain Location: Back pain after walking Pain Descriptors / Indicators: Aching Pain Intervention(s): Premedicated before session    Home Living                      Prior Function            PT Goals (current goals can now be found in the care plan section) Acute Rehab PT Goals Patient Stated Goal: To decrease pain PT Goal Formulation: With patient Time For Goal Achievement: 08/04/16 Potential to Achieve Goals: Good Progress towards PT goals: Progressing  toward goals    Frequency    Min 5X/week      PT Plan Current plan remains appropriate    Co-evaluation             End of Session Equipment Utilized During Treatment: Back brace Activity Tolerance: Patient tolerated treatment well;Patient limited by pain Patient left: in bed;with call bell/phone within reach     Time: 0915-0938 PT Time Calculation (min) (ACUTE ONLY): 23 min  Charges:  $Gait Training: 23-37 mins                    G Codes:      Van Clines Hamff 08/01/2016, 11:07 AM  Van Clines, PT  Acute  Rehabilitation Services Pager [phone] Office [phone]

## 2016-08-01 NOTE — Progress Notes (Deleted)
   Daily Progress Note  Central venogram suggests R UA AVG might be an option.  There is a suggestion of possible R SCV stenosis so patient is at some risk of right arm swelling.  Unfortunately, as both femoral veins have previously had TDC placed into each.  She is at risk for leg swelling with a thigh AVG.  - will see if she can be scheduled tomorrow or Thursday. - pt can come back for the procedure   Leonides Sake, MD, [facility] [place] Office: [phone] Pager: [phone]  08/01/2016, 7:18 AM

## 2016-08-01 NOTE — Interval H&P Note (Signed)
History and Physical Interval Note:  08/01/2016 3:03 PM  [name] Small  has presented today for surgery, with the diagnosis of End Stage Renal Disease N18.6  The various methods of treatment have been discussed with the patient and family. After consideration of risks, benefits and other options for treatment, the patient has consented to  Procedure(s): INSERTION OF ARTERIOVENOUS (AV) GORE-TEX GRAFT ARM (Right) as a surgical intervention .  The patient's history has been reviewed, patient examined, no change in status, stable for surgery.  I have reviewed the patient's chart and labs.  Questions were answered to the patient's satisfaction.     Waverly Ferrari

## 2016-08-01 NOTE — Op Note (Signed)
    NAME: [name] Small   MRN: [id number] DOB: Jun 26, 1991    DATE OF OPERATION: 08/01/2016  PREOP DIAGNOSIS: Stage V chronic kidney disease  POSTOP DIAGNOSIS: Same  PROCEDURE: New right upper arm loop graft (4-7 mm PTFE graft)  SURGEON: Di Kindle. Edilia Bo, MD, FACS  ASSIST: Doreatha Massed, PA  ANESTHESIA: Gen.   EBL: Minimal  INDICATIONS: Carle Vandaele is a 25 y.o. female who presents for new access.  FINDINGS: Patient had a very small brachial artery. She did not have a high bifurcation however. However, given the small size of the artery I elected to place an upper arm loop graft. The patient is at increased risk for steal.  TECHNIQUE: The patient was taken to the operating room and received a general anesthetic. The right upper extremity was prepped and draped in usual sterile fashion. A longitudinal incision was made over the brachial artery just above the antecubital level. Here the veins were very small and clearly not usable for outflow. The brachial artery was very small. I used papaverine to try to dilate this. I did interrogate with the Doppler to be sure that this was in fact the brachial artery which it was. Given the very small size and the artery felt that an upper arm loop was indicated.  A separate longitudinal incision was made beneath the axilla. Here the high brachial vein was dissected free and was a large 5 mm vein. The adjacent brachial artery was dissected free and it was larger although still somewhat small. A 4-7 mm PTFE graft was tunneled in a loop fashion in the upper arm with the arterial aspect along the lateral aspect of the upper arm. The patient was then heparinized. The brachial artery was clamped proximally and distally and longitudinal arteriotomy was made. I excised a very small segment of the 4 mm end of the graft which was slightly spatulated and sewn end to side to the artery using continuous 6-0 Prolene suture. The graft was in poorly probing  length for anastomosis to the high brachial vein. The vein was ligated distally and spatulated proximally. The graft was cut the appropriate length, spatulated, and sewn into into the vein using continuous 6-0 Prolene suture. At the completion was an excellent thrill in the fistula. There was a weak radial and ulnar signal with the Doppler. Hemostasis was obtained in the wounds. Each of the wounds was closed the deep layer of 3-0 Vicryl and the skin closed with 4-0 Vicryl. Dermabond was applied. The patient tolerated the procedure well and was transferred to the recovery room in stable condition. All needle and sponge counts were correct.  Waverly Ferrari, MD, FACS [facility]  DATE OF DICTATION:   08/01/2016

## 2016-08-01 NOTE — Progress Notes (Signed)
  Progress Note    08/01/2016 8:12 AM 1 Day Post-Op  Subjective:  No  Complaints this a.m.  Vitals:   07/31/16 2249 08/01/16 0531  BP: 102/64 103/74  Pulse: 97 98  Resp: 15 16  Temp: 98.4 F (36.9 C) 98.7 F (37.1 C)    Physical Exam: Breathing is nonlabored R arm palpable brachial and radial pulse  CBC    Component Value Date/Time   WBC 9.5 08/01/2016 0503   RBC 3.63 (L) 08/01/2016 0503   HGB 10.1 (L) 08/01/2016 0503   HCT 32.2 (L) 08/01/2016 0503   PLT 170 08/01/2016 0503   MCV 88.7 08/01/2016 0503   MCH 27.8 08/01/2016 0503   MCHC 31.4 08/01/2016 0503   RDW 18.8 (H) 08/01/2016 0503   LYMPHSABS 1.0 07/26/2016 1803   MONOABS 0.6 07/26/2016 1803   EOSABS 0.0 07/26/2016 1803   BASOSABS 0.0 07/26/2016 1803    BMET    Component Value Date/Time   NA 137 07/31/2016 1756   K 5.1 07/31/2016 1756   CL 97 (L) 07/31/2016 1756   CO2 21 (L) 07/31/2016 1756   GLUCOSE 122 (H) 07/31/2016 1756   BUN 71 (H) 07/31/2016 1756   CREATININE 8.63 (H) 07/31/2016 1756   CALCIUM 8.3 (L) 07/31/2016 1756   GFRNONAA 6 (L) 07/31/2016 1756   GFRAA 7 (L) 07/31/2016 1756    INR    Component Value Date/Time   INR 1.35 06/17/2016 0229     Intake/Output Summary (Last 24 hours) at 08/01/16 0812 Last data filed at 08/01/16 0532  Gross per 24 hour  Intake                0 ml  Output              500 ml  Net             -500 ml     Assessment:  25 y.o. female is here with Tspine fractures, currently dialyzes via left femoral tdc. Venogram yesterday with suitable vein for RUE avg.  Plan: OR today for Right arm av graft placement IV to be switched from Right arm to left   [name] Small C. Carren Blakley, MD [facility] [place] Office: [phone] Pager: [phone]  08/01/2016 8:12 AM  

## 2016-08-01 NOTE — Progress Notes (Signed)
Per Dr. Gentry Roch, B ok to use Magnet.

## 2016-08-01 NOTE — Discharge Instructions (Signed)
° ° °  08/01/2016 [name] Small [id number] 05/28/1991  Surgeon(s): Chuck Hint, MD  Procedure(s): INSERTION OF ARTERIOVENOUS (AV) GORE-TEX GRAFT RIGHT UPPER ARM  x Do not stick graft for 4 weeks

## 2016-08-01 NOTE — Progress Notes (Signed)
Called Joey, rep from [employer] regarding interrogation of patient's ICD.  Per Joey, their company does not interrogate ICD's post-operatively because device returns to normal function as soon as magnet is removed. Patient resting comfortably, NSR on monitor, will continue to monitor.

## 2016-08-01 NOTE — H&P (View-Only) (Signed)
  Progress Note    08/01/2016 8:12 AM 1 Day Post-Op  Subjective:  No  Complaints this a.m.  Vitals:   07/31/16 2249 08/01/16 0531  BP: 102/64 103/74  Pulse: 97 98  Resp: 15 16  Temp: 98.4 F (36.9 C) 98.7 F (37.1 C)    Physical Exam: Breathing is nonlabored R arm palpable brachial and radial pulse  CBC    Component Value Date/Time   WBC 9.5 08/01/2016 0503   RBC 3.63 (L) 08/01/2016 0503   HGB 10.1 (L) 08/01/2016 0503   HCT 32.2 (L) 08/01/2016 0503   PLT 170 08/01/2016 0503   MCV 88.7 08/01/2016 0503   MCH 27.8 08/01/2016 0503   MCHC 31.4 08/01/2016 0503   RDW 18.8 (H) 08/01/2016 0503   LYMPHSABS 1.0 07/26/2016 1803   MONOABS 0.6 07/26/2016 1803   EOSABS 0.0 07/26/2016 1803   BASOSABS 0.0 07/26/2016 1803    BMET    Component Value Date/Time   NA 137 07/31/2016 1756   K 5.1 07/31/2016 1756   CL 97 (L) 07/31/2016 1756   CO2 21 (L) 07/31/2016 1756   GLUCOSE 122 (H) 07/31/2016 1756   BUN 71 (H) 07/31/2016 1756   CREATININE 8.63 (H) 07/31/2016 1756   CALCIUM 8.3 (L) 07/31/2016 1756   GFRNONAA 6 (L) 07/31/2016 1756   GFRAA 7 (L) 07/31/2016 1756    INR    Component Value Date/Time   INR 1.35 06/17/2016 0229     Intake/Output Summary (Last 24 hours) at 08/01/16 0254 Last data filed at 08/01/16 0532  Gross per 24 hour  Intake                0 ml  Output              500 ml  Net             -500 ml     Assessment:  25 y.o. female is here with Tspine fractures, currently dialyzes via left femoral tdc. Venogram yesterday with suitable vein for RUE avg.  Plan: OR today for Right arm av graft placement IV to be switched from Right arm to left   Ercel Normoyle C. Randie Heinz, MD [facility] [place] Office: [phone] Pager: [phone]  08/01/2016 8:12 AM

## 2016-08-01 NOTE — Progress Notes (Signed)
[facility] Progress Note   Subjective:   Sitting up in bed with large chest/back brace present; still with back pain.  Denies CP or dyspnea.  Plan is for RUE AVG today.   Objective Vitals:   07/31/16 2156 07/31/16 2249 08/01/16 0531 08/01/16 1005  BP: (!) 102/44 102/64 103/74 117/88  Pulse: 90 97 98 98  Resp:  15 16 16   Temp:  98.4 F (36.9 C) 98.7 F (37.1 C) 98.8 F (37.1 C)  TempSrc:  Oral Oral Oral  SpO2:  95% 100% 100%  Weight:  50.4 kg (111 lb 1.8 oz)    Height:       Physical Exam General: Young, pleasant female. NAD. Large chest/back brace present. Heart: Tachycardic. Normal rhythm. Lungs: CTAB (exam limited by brace) Extremities: Trace LE edema. Dialysis Access: L femoral PC.  Dialysis Orders: [facility] on MWF, now accepted at [facility] unit (will be MWF 2nd shift, can start there once discharged). 4 hours, EDW 54, 2K/2Ca bath, 180 dialyzer, BFR 400/DFR 800 - Heparin 2000 unit bolus - Calcitriol 0.84mcg PO q HD (last Ca 9.6, Phos 6.7, PTH 4556) - No ESA   Additional Objective  Recent Labs Lab 07/27/16 1230 07/28/16 0451 07/31/16 1756  NA 138 139 137  K 5.2* 4.0 5.1  CL 99* 95* 97*  CO2 21* 26 21*  GLUCOSE 97 89 122*  BUN 86* 38* 71*  CREATININE 10.34* 6.20* 8.63*  CALCIUM 10.1 10.2 8.3*  PHOS 9.0* 7.4* 8.2*     Recent Labs Lab 07/26/16 1803  07/27/16 1230 07/28/16 0451 07/31/16 1756  AST 19  --   --   --   --   ALT 8*  --   --   --   --   ALKPHOS 282*  --   --   --   --   BILITOT 1.1  --   --   --   --   PROT 7.9  --   --   --   --   ALBUMIN 3.5  < > 3.0* 3.0* 3.0*  < > = values in this interval not displayed.   Recent Labs Lab 07/26/16 1803  07/27/16 1230 07/28/16 0451 07/30/16 0423 07/31/16 0626 08/01/16 0503  WBC 7.4  < > 6.0 6.0 7.1 4.9 9.5  NEUTROABS 5.8  --   --   --   --   --   --   HGB 11.4*  < > 10.1* 10.2* 10.7* 10.9* 10.1*  HCT 35.0*  < > 31.9* 32.8* 34.4* 34.2* 32.2*  MCV 89.1  < > 88.1 89.1  90.3 90.0 88.7  PLT 219  < > 128* 116* 115* 144* 170  < > = values in this interval not displayed. Blood Culture    Component Value Date/Time   SDES BLOOD RIGHT EJ 03/31/2016 1830   SPECREQUEST BOTTLES DRAWN AEROBIC AND ANAEROBIC  3CC 03/31/2016 1830   CULT NO GROWTH 5 DAYS 03/31/2016 1830   REPTSTATUS 04/05/2016 FINAL 03/31/2016 1830   Medications:   . calcitRIOL  0.5 mcg Oral Q M,W,F-HD  . carvedilol  6.25 mg Oral BID WC  . cinacalcet  30 mg Oral BID WC  . feeding supplement (NEPRO CARB STEADY)  237 mL Oral BID BM  . heparin  5,000 Units Subcutaneous Q8H  . hydrocerin   Topical BID  . isosorbide dinitrate  10 mg Oral TID  . multivitamin  1 tablet Oral QHS  . naproxen  500 mg Oral  BID WC  . oxyCODONE  15 mg Oral QID  . sertraline  100 mg Oral Daily  . sevelamer carbonate  2,400 mg Oral TID WC   Background: Ms. 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 Found to have T7, T8, T12 vertebral compression fractures. Ortho has decided to treat conservatively (no plan for kyphoplasty at this time). She had hyperkalemia and was volume overloaded initially due to missing dialysis due to pain; both now resolved with serial dialysis.  Assessment/Plan: 1. 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 Being managed with pain control/brace. No plan for kyphoplasty at this time. PT working with patient. 2. ESRD: HD MWF for now since volume much improved.  3. HTN/volume: BP low side; off hydralazine. Volume overload on admit, now s/p serial dialysis sessions with improved volume status. She is now roughly 3.5kg below her prior outpatient EDW, will need to be changed on discharge. 4. Clotted LUE dialysis access: Using femoral perm-cath. VVS  consulted, plan is for new R AVG today (10/10). 5. Anemia: Hgb 10.1; trending down. Start Aranesp 40mcg weekly. 6. Thrombocytopenia: Plts were dropping. HIT panel pending from 10/8. Now Plts improved to 170K. Monitor. 7. Hyperkalemia: Resolved. 8. Secondary hyperparathyroidism: Severely uncontrolled with last PTH > 4500. Continue current binders/VDRA/Sensipar dose for now and will adjust doses once taking meds consistently. Per Dr. Derrell Lollingamirez (gen surg) notes, they recommend evaluation in  tertiary care center for parathyroidectomy given severe cardiac disease/high surgical risk. 9. Non-ischemic cardiomyopathy (s/p AICD, EF 12-20%): On low dose coreg/isosorbide; off hydralazine. 10. Nutrition: Albumin 3; continue high protein diet and adding Nepro supplements. 11. Dispo: [place]  Ozzie HoyleKatie Stovall, PA-C 08/01/2016, 10:19 AM  [facility] Pager: [phone]  I have seen and examined this patient and agree with plan and assessment in the above note with renal recommendations/intervention highlighted. For AVG today. HD tomorrow. [place] [facility] [place] [facility]  [facility] when ready for discharge.  Viviann Broyles B,MD 08/01/2016 1:53 PM

## 2016-08-01 NOTE — Telephone Encounter (Signed)
Call received from [facility] CM requesting a hospital follow up for the patient. She already has an appointment at the [facility] on 09/05/16 but they would like to have her seen sooner.  With Dr Louis Meckel approval, an appointment was scheduled for 08/03/16 @ 1530 at [facility].   Update provided to [facility], RN CM.

## 2016-08-01 NOTE — Progress Notes (Signed)
08/01/2016 2:56 PM Hemodialysis Outpatient Note; Ms Buonanno has been accepted at the [facility] on a Monday, Wednesday and Friday 2nd shift schedule. The center can NOT begin treatment until Friday at 11:00AM. Thank you Cleotis Nipper

## 2016-08-02 ENCOUNTER — Telehealth: Payer: Self-pay | Admitting: Vascular Surgery

## 2016-08-02 ENCOUNTER — Encounter (HOSPITAL_COMMUNITY): Payer: Self-pay | Admitting: Vascular Surgery

## 2016-08-02 DIAGNOSIS — J810 Acute pulmonary edema: Secondary | ICD-10-CM

## 2016-08-02 LAB — RENAL FUNCTION PANEL
ALBUMIN: 3.1 g/dL — AB (ref 3.5–5.0)
Anion gap: 18 — ABNORMAL HIGH (ref 5–15)
BUN: 54 mg/dL — AB (ref 6–20)
CALCIUM: 9 mg/dL (ref 8.9–10.3)
CO2: 22 mmol/L (ref 22–32)
CREATININE: 6 mg/dL — AB (ref 0.44–1.00)
Chloride: 99 mmol/L — ABNORMAL LOW (ref 101–111)
GFR calc Af Amer: 10 mL/min — ABNORMAL LOW (ref >60)
GFR, EST NON AFRICAN AMERICAN: 9 mL/min — AB (ref >60)
Glucose, Bld: 95 mg/dL (ref 65–99)
PHOSPHORUS: 8.5 mg/dL — AB (ref 2.5–4.6)
POTASSIUM: 4.7 mmol/L (ref 3.5–5.1)
SODIUM: 139 mmol/L (ref 135–145)

## 2016-08-02 LAB — CBC
HCT: 30.5 % — ABNORMAL LOW (ref 36.0–46.0)
Hemoglobin: 9.5 g/dL — ABNORMAL LOW (ref 12.0–15.0)
MCH: 27.5 pg (ref 26.0–34.0)
MCHC: 31.1 g/dL (ref 30.0–36.0)
MCV: 88.4 fL (ref 78.0–100.0)
PLATELETS: 155 10*3/uL (ref 150–400)
RBC: 3.45 MIL/uL — ABNORMAL LOW (ref 3.87–5.11)
RDW: 18.8 % — AB (ref 11.5–15.5)
WBC: 8.5 10*3/uL (ref 4.0–10.5)

## 2016-08-02 MED ORDER — CALCITONIN (SALMON) 200 UNIT/ACT NA SOLN: 1.0000 | Freq: Every day | NASAL | 0 refills | Status: DC

## 2016-08-02 MED ORDER — CINACALCET HCL 30 MG PO TABS: 30.0000 mg | ORAL_TABLET | Freq: Two times a day (BID) | ORAL | 0 refills | Status: DC

## 2016-08-02 MED ORDER — OXYCODONE HCL 15 MG PO TABS: 15.0000 mg | ORAL_TABLET | Freq: Four times a day (QID) | ORAL | 0 refills | Status: DC | PRN

## 2016-08-02 MED ORDER — FAMOTIDINE 20 MG PO TABS: 20.0000 mg | ORAL_TABLET | Freq: Two times a day (BID) | ORAL | 0 refills | Status: DC | PRN

## 2016-08-02 MED ORDER — ALBUMIN HUMAN 25 % IV SOLN
12.5000 g | Freq: Once | INTRAVENOUS | Status: DC
  Filled 2016-08-02: qty 50

## 2016-08-02 MED ORDER — SODIUM CHLORIDE 0.9 % IV SOLN: 100.0000 mL | INTRAVENOUS | Status: DC | PRN

## 2016-08-02 MED ORDER — CALCITRIOL 0.5 MCG PO CAPS: 0.5000 ug | ORAL_CAPSULE | ORAL | 0 refills | Status: DC

## 2016-08-02 MED ORDER — CARVEDILOL 6.25 MG PO TABS: 6.1250 mg | ORAL_TABLET | Freq: Two times a day (BID) | ORAL | 0 refills | Status: DC

## 2016-08-02 MED ORDER — OXYCODONE HCL 5 MG PO TABS
ORAL_TABLET | ORAL | Status: AC
  Administered 2016-08-02: 15 mg
  Filled 2016-08-02: qty 3

## 2016-08-02 MED ORDER — ALBUMIN HUMAN 25 % IV SOLN
INTRAVENOUS | Status: AC
Start: 2016-08-02 — End: 2016-08-02
  Administered 2016-08-02: 12.5 g
  Filled 2016-08-02: qty 100

## 2016-08-02 MED ORDER — NAPROXEN 500 MG PO TABS: 500.0000 mg | ORAL_TABLET | Freq: Two times a day (BID) | ORAL | 0 refills | Status: DC

## 2016-08-02 NOTE — Progress Notes (Signed)
Received a call from Lab saying patient's HIT panel was unable to be done.

## 2016-08-02 NOTE — Progress Notes (Signed)
   VASCULAR SURGERY ASSESSMENT & PLAN:  1 Day Post-Op s/p: right upper arm loop AVG. Graft with good thrill  Has some steal symptoms. She has very small arteries and was at increased risk for this. I used a 4-7 graft and left most of the 4 mm end (ie, I have done everything possible to lower risk of steal).   Doppler signals this AM are improved compared to intraop, so I think that this will gradually improve. We will follow. If symptoms got worse, her only option would be graft removal.  SUBJECTIVE: Some paresthesias right hand  PHYSICAL EXAM: Vitals:   08/01/16 1854 08/01/16 2100 08/01/16 2143 08/02/16 0531  BP: (!) 90/54 (!) 77/33 (!) 90/55 (!) 89/73  Pulse: 95 88 96 88  Resp: 18 20  18   Temp: 97.3 F (36.3 C) 97.8 F (36.6 C)  98.1 F (36.7 C)  TempSrc:  Oral  Oral  SpO2: 99% 100%  100%  Weight:  116 lb 10 oz (52.9 kg)    Height:       Good thrill in right upper arm AVG Radial and ulnar signal with doppler, right hand Grip intact Incisions fine.   LABS: Lab Results  Component Value Date   WBC 9.5 08/01/2016   HGB 11.9 (L) 08/01/2016   HCT 35.0 (L) 08/01/2016   MCV 88.7 08/01/2016   PLT 170 08/01/2016   Lab Results  Component Value Date   CREATININE 8.63 (H) 07/31/2016   Lab Results  Component Value Date   INR 1.35 06/17/2016   CBG (last 3)  No results for input(s): GLUCAP in the last 72 hours.  Principal Problem:   Acute pulmonary edema (HCC) Active Problems:   Hyperkalemia   ESRD on dialysis [facility])   AICD (automatic cardioverter/defibrillator) present   Failed kidney transplant   NICM (nonischemic cardiomyopathy) (HCC)   Essential hypertension   Thoracic compression fracture - T7, T8, T12   Hyperparathyroidism due to end stage renal disease on dialysis [facility])   Osteoporosis   Cari Caraway Beeper: [phone] 08/02/2016

## 2016-08-02 NOTE — Progress Notes (Signed)
Plan d/c today, cancelled after HD as pt more hypotensive. Will re evaluate in AM.  Debbora Presto, MD  [facility] Pager [phone]  If 7PM-7AM, please contact night-coverage [email or web address] Password [email or web address]

## 2016-08-02 NOTE — Telephone Encounter (Signed)
PA to see on the 18th, letter mailed

## 2016-08-02 NOTE — Progress Notes (Addendum)
[facility] Progress Note   Subjective:  Seen in room. Denies chest pain or dyspnea. Still with back pain and R arm soreness. Per vascular surgery note, she has had some steal syndrome symptoms, but improved distal flow on ultrasound this morning which they are monitoring.   Objective Vitals:   08/01/16 1854 08/01/16 2100 08/01/16 2143 08/02/16 0531  BP: (!) 90/54 (!) 77/33 (!) 90/55 (!) 89/73  Pulse: 95 88 96 88  Resp: 18 20  18   Temp: 97.3 F (36.3 C) 97.8 F (36.6 C)  98.1 F (36.7 C)  TempSrc:  Oral  Oral  SpO2: 99% 100%  100%  Weight:  52.9 kg (116 lb 10 oz)    Height:       Physical Exam General: Pleasant, well nourished female, NAD. Not wearing back brace currently. Heart: RRR; no murmur. Lungs: CTAB. Extremities: No LE edema. R arm with tenderness around new AVG. Dialysis Access: L femoral PC and new RUE AVG + bruit, but very tender. R hand just a bit cooler than the left (and still c/o numb   Additional Objective    Recent Labs Lab 07/27/16 1230 07/28/16 0451 07/31/16 1756 08/01/16 1421  NA 138 139 137 138  K 5.2* 4.0 5.1 3.9  CL 99* 95* 97*  --   CO2 21* 26 21*  --   GLUCOSE 97 89 122* 73  BUN 86* 38* 71*  --   CREATININE 10.34* 6.20* 8.63*  --   CALCIUM 10.1 10.2 8.3*  --   PHOS 9.0* 7.4* 8.2*  --    Liver Function Tests:  Recent Labs Lab 07/26/16 1803  07/27/16 1230 07/28/16 0451 07/31/16 1756  AST 19  --   --   --   --   ALT 8*  --   --   --   --   ALKPHOS 282*  --   --   --   --   BILITOT 1.1  --   --   --   --   PROT 7.9  --   --   --   --   ALBUMIN 3.5  < > 3.0* 3.0* 3.0*  < > = values in this interval not displayed. CBC:  Recent Labs Lab 07/26/16 1803  07/27/16 1230 07/28/16 0451 07/30/16 0423 07/31/16 0626 08/01/16 0503 08/01/16 1421  WBC 7.4  < > 6.0 6.0 7.1 4.9 9.5  --   NEUTROABS 5.8  --   --   --   --   --   --   --   HGB 11.4*  < > 10.1* 10.2* 10.7* 10.9* 10.1* 11.9*  HCT 35.0*  < > 31.9* 32.8*  34.4* 34.2* 32.2* 35.0*  MCV 89.1  < > 88.1 89.1 90.3 90.0 88.7  --   PLT 219  < > 128* 116* 115* 144* 170  --   < > = values in this interval not displayed.  Medications:   . calcitRIOL  0.5 mcg Oral Q M,W,F-HD  . carvedilol  6.25 mg Oral BID WC  . cinacalcet  30 mg Oral BID WC  . darbepoetin (ARANESP) injection - DIALYSIS  40 mcg Intravenous Q Wed-HD  . feeding supplement (NEPRO CARB STEADY)  237 mL Oral BID BM  . heparin  5,000 Units Subcutaneous Q8H  . hydrocerin   Topical BID  . isosorbide dinitrate  10 mg Oral TID  . multivitamin  1 tablet Oral QHS  . naproxen  500 mg Oral BID  WC  . oxyCODONE  15 mg Oral QID  . sertraline  100 mg Oral Daily  . sevelamer carbonate  2,400 mg Oral TID WC   Dialysis Orders: [facility] on MWF,[facility] (will be MWF 2nd shift, can start there once discharged). 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 Found to have T7, T8, T12 vertebral compression fractures. Ortho has decided to treat conservatively (no plan for kyphoplasty at this time). She had hyperkalemia and was volume overloaded initially due to missing dialysis due to pain; both now resolved with serial dialysis.  Assessment/Plan: 1. 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 Being managed with pain control/brace. No plan for kyphoplasty at this time. PT working with patient.  2. ESRD: HD MWF for now since volume much improved, for HD today.  3. HTN/volume: BP low again; off hydralazine.  Isosorbide/coreg with holding parameters, can consider stopping isosorbide completely. Volume overload on admit, now s/p serial dialysis sessions with improved volume status. She is now roughly 3kg below her prior outpatient EDW, will need to be changed on discharge.  4. Clotted LUE dialysis access: Using femoral perm-cath. VVS consulted, now s/p RUE AVG 10/10. Mild steal sx - monitor  5. Anemia: Hgb now 11.9; will hold on Aranesp for now.  6. Thrombocytopenia: Plts were dropping. HIT panel pending from 10/8. Now Plts improved to 170K. Monitor.  7. Hyperkalemia: Resolved.  8. Secondary hyperparathyroidism: Severely uncontrolled with last PTH > 4500. Continue current binders/VDRA/Sensipar dose for now and will adjust doses once taking meds consistently (has missed several doses of binders/sensipar this admit). Per Dr. Derrell Lollingamirez (gen surg) notes, they recommend evaluation in tertiary care center for parathyroidectomy given severe cardiac disease/high surgical risk.  9. Non-ischemic cardiomyopathy (s/p AICD, EF 12-20%): On low dose coreg/isosorbide; off hydralazine.  10. Nutrition: Albumin 3; continue high protein diet and adding Nepro supplements.  11. Dispo: [place] Can start as early as Friday 10/13, will need to be there at 10:30a to sign papers for HD start time of 11a.  Ozzie HoyleKatie Stovall, PA-C 08/02/2016, 9:32 AM  [facility] Pager: [phone]  I have seen and examined this patient and agree with plan and assessment in the above note with renal recommendations/intervention highlighted. She is still requiring regular IV pain medication which will be the main hindrance to discharge from the hospital I suspect.  Khyren Hing B,MD 08/02/2016 2:22 PM

## 2016-08-02 NOTE — Progress Notes (Signed)
PT Cancellation Note  Patient Details Name: [name] Small MRN: [id number] DOB: 1991/02/28   Cancelled Treatment:    Reason Eval/Treat Not Completed: Medical issues which prohibited therapy   Currently in HD;  Will follow up later today as time allows;  Otherwise, will follow up for PT tomorrow;   Thank you,  Van Clines, PT  Acute Rehabilitation Services Pager [phone] Office [phone]     Van Clines [facility] 08/02/2016, 3:40 PM

## 2016-08-02 NOTE — Procedures (Signed)
I have personally attended this patient's dialysis session.   TDC left groin 2 kg under EDW so will keep even  Camille Bal, MD [facility] [phone] Pager 08/02/2016, 2:30 PM

## 2016-08-02 NOTE — Progress Notes (Signed)
Called about patient's being hypotensive, SBP in the 60s according to BP on R and L legs.  Patient has a new fistula on the RUA and older one on the LUA. Patient was alert, awake, conversant.  Patient was in pain from fractures sustained at home from a fall. HR in the 100-105 range, patient breathing well, on RA, no distress.  MD called, asked if was okay to check BP on LUA since there was an IV in that arm already. Repeat BP was better, 84/54, MAP 62, MD at bedside, informed RN to hold ANT-HTN agents.  Call for follow up at 1125 and patient is stable.

## 2016-08-02 NOTE — Progress Notes (Signed)
While checking routine vitals, BP noted as low. RRT, and Dr Izola Price notified.  RRT responded bedside.  Patient alert and oriented.  Dr responded bedside. Blood pressure taken in left arm was 84/54.

## 2016-08-02 NOTE — Telephone Encounter (Signed)
-----   Message from Phillips Odor, RN sent at 08/02/2016 10:28 AM EDT ----- Regarding: needs 2 week f/u with Dr. Edilia Bo   ----- Message ----- From: Chuck Hint, MD Sent: 08/01/2016   5:24 PM To: Vvs Charge Pool Subject: charge                                         PROCEDURE: New right upper arm loop graft (4-7 mm PTFE graft)  SURGEON: Di Kindle. Edilia Bo, MD, FACS  ASSIST: Doreatha Massed, PA  She is at risk for steal so she will need a follow up visit in 2 weeks. Thank you. CD

## 2016-08-03 ENCOUNTER — Encounter: Payer: Self-pay | Admitting: Vascular Surgery

## 2016-08-03 ENCOUNTER — Inpatient Hospital Stay (HOSPITAL_COMMUNITY): Payer: Medicare Other | Admitting: Certified Registered Nurse Anesthetist

## 2016-08-03 ENCOUNTER — Encounter (HOSPITAL_COMMUNITY)
Admission: EM | Disposition: A | Discharge status: Home Health | Payer: Self-pay | Source: Home / Self Care | Attending: Internal Medicine

## 2016-08-03 ENCOUNTER — Ambulatory Visit: Payer: Medicare Other | Admitting: Internal Medicine

## 2016-08-03 ENCOUNTER — Encounter (HOSPITAL_COMMUNITY): Payer: Self-pay | Admitting: Surgery

## 2016-08-03 DIAGNOSIS — T82898A Other specified complication of vascular prosthetic devices, implants and grafts, initial encounter: Secondary | ICD-10-CM

## 2016-08-03 HISTORY — PX: AVGG REMOVAL: SHX5153

## 2016-08-03 LAB — RENAL FUNCTION PANEL
ALBUMIN: 3.3 g/dL — AB (ref 3.5–5.0)
Anion gap: 14 (ref 5–15)
BUN: 35 mg/dL — AB (ref 6–20)
CALCIUM: 9.3 mg/dL (ref 8.9–10.3)
CHLORIDE: 98 mmol/L — AB (ref 101–111)
CO2: 24 mmol/L (ref 22–32)
CREATININE: 4.4 mg/dL — AB (ref 0.44–1.00)
GFR, EST AFRICAN AMERICAN: 15 mL/min — AB (ref >60)
GFR, EST NON AFRICAN AMERICAN: 13 mL/min — AB (ref >60)
Glucose, Bld: 110 mg/dL — ABNORMAL HIGH (ref 65–99)
PHOSPHORUS: 6.5 mg/dL — AB (ref 2.5–4.6)
Potassium: 3.8 mmol/L (ref 3.5–5.1)
SODIUM: 136 mmol/L (ref 135–145)

## 2016-08-03 LAB — CBC
HCT: 33.9 % — ABNORMAL LOW (ref 36.0–46.0)
Hemoglobin: 10.2 g/dL — ABNORMAL LOW (ref 12.0–15.0)
MCH: 28 pg (ref 26.0–34.0)
MCHC: 30.1 g/dL (ref 30.0–36.0)
MCV: 93.1 fL (ref 78.0–100.0)
PLATELETS: 203 10*3/uL (ref 150–400)
RBC: 3.64 MIL/uL — AB (ref 3.87–5.11)
RDW: 13.7 % (ref 11.5–15.5)
WBC: 13.3 10*3/uL — AB (ref 4.0–10.5)

## 2016-08-03 SURGERY — REMOVAL OF ARTERIOVENOUS GORETEX GRAFT (AVGG)
Anesthesia: General | Site: Arm Upper | Laterality: Right

## 2016-08-03 MED ORDER — SODIUM CHLORIDE 0.9 % IV SOLN
INTRAVENOUS | Status: DC | PRN
  Administered 2016-08-03: 500 mL

## 2016-08-03 MED ORDER — LIDOCAINE HCL (CARDIAC) 20 MG/ML IV SOLN
INTRAVENOUS | Status: DC | PRN
  Administered 2016-08-03: 100 mg via INTRATRACHEAL

## 2016-08-03 MED ORDER — FENTANYL CITRATE (PF) 250 MCG/5ML IJ SOLN
INTRAMUSCULAR | Status: AC
  Filled 2016-08-03: qty 5

## 2016-08-03 MED ORDER — CEFAZOLIN SODIUM-DEXTROSE 2-4 GM/100ML-% IV SOLN
INTRAVENOUS | Status: AC
  Filled 2016-08-03: qty 100

## 2016-08-03 MED ORDER — LIDOCAINE-EPINEPHRINE (PF) 1 %-1:200000 IJ SOLN
INTRAMUSCULAR | Status: DC | PRN
  Administered 2016-08-03: 6 mL

## 2016-08-03 MED ORDER — ETOMIDATE 2 MG/ML IV SOLN
INTRAVENOUS | Status: DC | PRN
  Administered 2016-08-03: 14 mg via INTRAVENOUS

## 2016-08-03 MED ORDER — FENTANYL CITRATE (PF) 250 MCG/5ML IJ SOLN
INTRAMUSCULAR | Status: DC | PRN
  Administered 2016-08-03 (×2): 50 ug via INTRAVENOUS

## 2016-08-03 MED ORDER — PROPOFOL 10 MG/ML IV BOLUS
INTRAVENOUS | Status: AC
  Filled 2016-08-03: qty 40

## 2016-08-03 MED ORDER — MIDAZOLAM HCL 5 MG/5ML IJ SOLN
INTRAMUSCULAR | Status: DC | PRN
  Administered 2016-08-03 (×2): 1 mg via INTRAVENOUS

## 2016-08-03 MED ORDER — CEFAZOLIN SODIUM-DEXTROSE 2-3 GM-% IV SOLR
2.0000 g | Freq: Once | INTRAVENOUS | Status: AC
  Administered 2016-08-03: 2 g via INTRAVENOUS
  Filled 2016-08-03: qty 50

## 2016-08-03 MED ORDER — 0.9 % SODIUM CHLORIDE (POUR BTL) OPTIME
TOPICAL | Status: DC | PRN
  Administered 2016-08-03: 1000 mL

## 2016-08-03 MED ORDER — HEMOSTATIC AGENTS (NO CHARGE) OPTIME
TOPICAL | Status: DC | PRN
  Administered 2016-08-03: 1 via TOPICAL

## 2016-08-03 MED ORDER — MIDAZOLAM HCL 2 MG/2ML IJ SOLN
INTRAMUSCULAR | Status: AC
  Filled 2016-08-03: qty 2

## 2016-08-03 MED ORDER — ONDANSETRON HCL 4 MG/2ML IJ SOLN
INTRAMUSCULAR | Status: AC
  Filled 2016-08-03: qty 2

## 2016-08-03 MED ORDER — PHENYLEPHRINE HCL 10 MG/ML IJ SOLN
INTRAVENOUS | Status: DC | PRN
  Administered 2016-08-03: 20 ug/min via INTRAVENOUS

## 2016-08-03 MED ORDER — LIDOCAINE-EPINEPHRINE (PF) 1 %-1:200000 IJ SOLN
INTRAMUSCULAR | Status: AC
  Filled 2016-08-03: qty 30

## 2016-08-03 MED ORDER — ONDANSETRON HCL 4 MG/2ML IJ SOLN
INTRAMUSCULAR | Status: DC | PRN
  Administered 2016-08-03: 4 mg via INTRAVENOUS

## 2016-08-03 MED ORDER — EPHEDRINE SULFATE 50 MG/ML IJ SOLN
INTRAMUSCULAR | Status: DC | PRN
  Administered 2016-08-03 (×3): 15 mg via INTRAVENOUS

## 2016-08-03 SURGICAL SUPPLY — 34 items
ARMBAND PINK RESTRICT EXTREMIT (MISCELLANEOUS) ×6 IMPLANT
CANISTER SUCTION 2500CC (MISCELLANEOUS) ×3 IMPLANT
CLIP TI MEDIUM 6 (CLIP) ×3 IMPLANT
CLIP TI WIDE RED SMALL 6 (CLIP) ×3 IMPLANT
DERMABOND ADVANCED (GAUZE/BANDAGES/DRESSINGS) ×1
DERMABOND ADVANCED .7 DNX12 (GAUZE/BANDAGES/DRESSINGS) ×2 IMPLANT
ELECT REM PT RETURN 9FT ADLT (ELECTROSURGICAL) ×3
ELECTRODE REM PT RTRN 9FT ADLT (ELECTROSURGICAL) ×2 IMPLANT
GEL ULTRASOUND 20GR AQUASONIC (MISCELLANEOUS) IMPLANT
GLOVE BIO SURGEON STRL SZ7.5 (GLOVE) ×3 IMPLANT
GOWN STRL REUS W/ TWL LRG LVL3 (GOWN DISPOSABLE) ×4 IMPLANT
GOWN STRL REUS W/ TWL XL LVL3 (GOWN DISPOSABLE) ×2 IMPLANT
GOWN STRL REUS W/TWL LRG LVL3 (GOWN DISPOSABLE) ×2
GOWN STRL REUS W/TWL XL LVL3 (GOWN DISPOSABLE) ×1
HEMOSTAT SNOW SURGICEL 2X4 (HEMOSTASIS) ×3 IMPLANT
INSERT FOGARTY SM (MISCELLANEOUS) IMPLANT
KIT BASIN OR (CUSTOM PROCEDURE TRAY) ×3 IMPLANT
KIT ROOM TURNOVER OR (KITS) ×3 IMPLANT
LIQUID BAND (GAUZE/BANDAGES/DRESSINGS) IMPLANT
NS IRRIG 1000ML POUR BTL (IV SOLUTION) ×3 IMPLANT
PACK CV ACCESS (CUSTOM PROCEDURE TRAY) ×3 IMPLANT
PAD ARMBOARD 7.5X6 YLW CONV (MISCELLANEOUS) ×6 IMPLANT
PATCH VASC XENOSURE 1CMX6CM (Vascular Products) ×1 IMPLANT
PATCH VASC XENOSURE 1X6 (Vascular Products) ×2 IMPLANT
SUT GORETEX 6.0 TH-9 30 IN (SUTURE) IMPLANT
SUT GORETEX CV-6TTC-13 36IN (SUTURE) IMPLANT
SUT MNCRL AB 4-0 PS2 18 (SUTURE) IMPLANT
SUT PROLENE 5 0 C 1 24 (SUTURE) ×6 IMPLANT
SUT PROLENE 6 0 BV (SUTURE) ×6 IMPLANT
SUT SILK 2 0 SH (SUTURE) IMPLANT
SUT VIC AB 3-0 SH 27 (SUTURE) ×2
SUT VIC AB 3-0 SH 27X BRD (SUTURE) ×4 IMPLANT
UNDERPAD 30X30 (UNDERPADS AND DIAPERS) ×3 IMPLANT
WATER STERILE IRR 1000ML POUR (IV SOLUTION) ×3 IMPLANT

## 2016-08-03 NOTE — Anesthesia Procedure Notes (Signed)
Procedure Name: LMA Insertion Date/Time: 08/03/2016 11:08 AM Performed by: Marena Chancy Pre-anesthesia Checklist: Patient identified, Emergency Drugs available, Suction available and Patient being monitored Patient Re-evaluated:Patient Re-evaluated prior to inductionOxygen Delivery Method: Circle System Utilized Preoxygenation: Pre-oxygenation with 100% oxygen Intubation Type: IV induction Ventilation: Mask ventilation without difficulty LMA: LMA inserted LMA Size: 4.0 and 3.0 Number of attempts: 1 Airway Equipment and Method: Bite block Placement Confirmation: positive ETCO2 Tube secured with: Tape Dental Injury: Teeth and Oropharynx as per pre-operative assessment

## 2016-08-03 NOTE — Op Note (Signed)
    OPERATIVE NOTE   PROCEDURE: 1. Excision of right upper extremity av graft 2. Patch angioplasty of R axillary artery  PRE-OPERATIVE DIAGNOSIS: Right arm steal  POST-OPERATIVE DIAGNOSIS: same  SURGEON: Dario Yono C. Randie Heinz, MD  ASSISTANT(S): Maris Berger, MD  ANESTHESIA: local and general  ESTIMATED BLOOD LOSS: 50 cc   INDICATIONS:   [name] Small is a 25 y.o. female who had placement of right upper extremity looped axillary AV graft. In the immediate postoperative period she is noted to have some numbness of her hand which has worsened over the past day. She is now indicated for excision of the graft.  DESCRIPTION: Patient correctly identified taken to the operating room placed supine on the operating table general anesthesia was induced. Upon induction patient did have very low blood pressure but maintain a pulse throughout. We then quickly prepped and draped the right upper extremity and she was given antibiotics by anesthesia. After timeout called I opened the proximal incision in her upper medial right arm. I cut the suture to expose the AV graft below the fascia. I then test clamped the venous anastomosis and checked Doppler which did improve the signal in the axillary artery. The graft was then trimmed away from the vein the vein oversewn with 5-0 Prolene suture with a running mattress stitch. I then clamped the artery proximally and distally trimmed the graft off of the artery. Graft was removed in its entirety. The artery was flushed proximally and distally with heparinized saline and a bovine pericardial patch was trimmed to size and sewn as a patch angioplasty with 6-0 Prolene suture. On releasing clamps that's Place 1 repair suture at the most proximal aspect of the patch. Then checked Doppler signals which were improved to the level of the wrist from preoperatively. Breasts the upper arm to drain hematoma from the graft tunneling site. Wound was irrigated hemostasis obtained  is closed in 2 layers 3-0 Vicryl for Monocryl and Dermabond placed level of the skin. Patient awakened from anesthesia and tolerated procedure well pressure PACU back to her room when stable.  COMPLICATIONS: none immediate  CONDITION: good   Savahna Casados C. Randie Heinz, MD [facility] [place] Office: [phone] Pager: [phone]  08/03/2016, 12:01 PM

## 2016-08-03 NOTE — Progress Notes (Signed)
PROGRESS NOTE    [name] Small  ZOX:[id number] DOB: 1991/01/16 DOA: 07/26/2016  PCP: Jeanann Lewandowsky, MD   Brief Narrative:  25 y.o. female with ESRD on hemodialysis noncompliant with dialysis, cardiomyopathy status post AICD placement as had recent stress test done in September 26 which showed EF of 12% with reversible ischemia, hypertension presented to the ER because of increasing upper back pain after a recent fall and shortness of breath. Labs show hyperkalemia. Patient states after a fall she was not able to walk due to pain and had not gone to her dialysis for at least 2 sessions. EKG showed some prominent T waves. On-call nephrologist consulted and patient  taken for urgent dialysis.  Subjective: Overnight developed worsened pain in R hand and her fingers felt stiff, still with significant lower and upper back pain.   Assessment & Plan:   Principal Problem:   Acute pulmonary edema / volume overload/   ESRD on dialysis  - due to missing dialysis 2 x and medical non compliance  - appreciate Nephrology consult - HD MWF    Clotted LUE dialysis access - Using femoral perm-cath - VVS consulted, and had RUEAVG placed 10/10, but developed steal syndrome, RUE AVG removed 10/12 - taken to OR today for Excision of right upper extremity av graft/Patch angioplasty of R axillary artery    Secondary hyperparathyroidism - Severely uncontrolled with last PTH >4500.  - Continue current binders/VDRA/Sensipar dose for now and will adjust doses once taking meds consistently  - Dr. Derrell Lolling (gen surg) notes, they recommend evaluation in tertiary care center for parathyroidectomy given severe cardiac disease/high surgical risk.    Hypotension - hold her antihypertensives until BP stabilizes   Active Problems:   Back pain- thoracic compression fractures - as a result of a fall in setting or osteoporosis - CT showed T7 fracture 18-19 %, subtle compression deformity superior endplate of  T8, probable acute/subacute fracture at T12  - pain control-   increased Percocet to 3 tabs QID, cont Naproxen and 1/2 the dose of PRN Dilaudid - Dr Shon Baton, Ortho, who recommends conservative management for now with brace and pain rather than a kyphoplasty - if her hyperparathyroidism cannot be resolved via surgery, he will consider a more permanent solution with a kyphoplasty - PRN Miralax for constipation    Hyperkalemia - due to missing dialysis - resolved     NICM (nonischemic cardiomyopathy) - EF 20-25 % - Coreg at 1/2 home dose (6.25) & Imdur - hold due to low BP    Acute thrombocytopenia - cause uncertain-receiving Heparin s/c - platelets were dropping daily- per nephrology, cont Heparin for now- checked HIT panel - platelets improving   DVT prophylaxis: heparin Code Status: Full code Family Communication:  Disposition Plan: will d/c once access placed and dialysis center found  Consultants:   Nephrology  IR Antimicrobials:  Anti-infectives    Start     Dose/Rate Route Frequency Ordered Stop   08/03/16 1130  ceFAZolin (ANCEF) IVPB 2 g/50 mL premix     2 g 100 mL/hr over 30 Minutes Intravenous  Once 08/03/16 1122 08/03/16 1110     Objective: Vitals:   08/03/16 1230 08/03/16 1235 08/03/16 1253 08/03/16 1806  BP: 114/67  96/79 107/61  Pulse: 93  (!) 49 90  Resp: 18  18 18   Temp:  97.2 F (36.2 C)  98.1 F (36.7 C)  TempSrc:    Oral  SpO2: 100%  95% 100%  Weight:  Height:        Intake/Output Summary (Last 24 hours) at 08/03/16 1858 Last data filed at 08/03/16 1800  Gross per 24 hour  Intake              642 ml  Output                0 ml  Net              642 ml   Filed Weights   08/01/16 2100 08/02/16 1415 08/02/16 2119  Weight: 52.9 kg (116 lb 10 oz) 49.8 kg (109 lb 12.6 oz) 50.8 kg (111 lb 14.4 oz)    Examination: General exam: Appears uncomfortable due to pain HEENT: PERRLA, oral mucosa moist, no sclera icterus or thrush Respiratory  system: Clear to auscultation. Respiratory effort normal. Cardiovascular system: S1 & S2 heard, RRR.  No murmurs  Gastrointestinal system: Abdomen soft, non-tender, nondistended. Normal bowel sound. No organomegaly Back: tender along thoracic spine in T8 and T 12 vicinity Central nervous system: Alert and oriented. No focal neurological deficits. Extremities: No cyanosis, clubbing or edema Skin: No rashes or ulcers Psychiatry:  Mood & affect appropriate.     Data Reviewed: I have personally reviewed following labs and imaging studies  CBC:  Recent Labs Lab 07/30/16 0423 07/31/16 0626 08/01/16 0503 08/01/16 1421 08/02/16 1438 08/03/16 0453  WBC 7.1 4.9 9.5  --  8.5 13.3*  HGB 10.7* 10.9* 10.1* 11.9* 9.5* 10.2*  HCT 34.4* 34.2* 32.2* 35.0* 30.5* 33.9*  MCV 90.3 90.0 88.7  --  88.4 93.1  PLT 115* 144* 170  --  155 203   Basic Metabolic Panel:  Recent Labs Lab 07/28/16 0451 07/31/16 1756 08/01/16 1421 08/02/16 1437 08/03/16 0453  NA 139 137 138 139 136  K 4.0 5.1 3.9 4.7 3.8  CL 95* 97*  --  99* 98*  CO2 26 21*  --  22 24  GLUCOSE 89 122* 73 95 110*  BUN 38* 71*  --  54* 35*  CREATININE 6.20* 8.63*  --  6.00* 4.40*  CALCIUM 10.2 8.3*  --  9.0 9.3  PHOS 7.4* 8.2*  --  8.5* 6.5*   Liver Function Tests:  Recent Labs Lab 07/28/16 0451 07/31/16 1756 08/02/16 1437 08/03/16 0453  ALBUMIN 3.0* 3.0* 3.1* 3.3*   Recent Results (from the past 240 hour(s))  MRSA PCR Screening     Status: None   Collection Time: 07/27/16  8:07 AM  Result Value Ref Range Status   MRSA by PCR NEGATIVE NEGATIVE Final    Comment:        The GeneXpert MRSA Assay (FDA approved for NASAL specimens only), is one component of a comprehensive MRSA colonization surveillance program. It is not intended to diagnose MRSA infection nor to guide or monitor treatment for MRSA infections.          Radiology Studies: No results found.    Scheduled Meds: . albumin human  12.5 g  Intravenous Once  . albumin human  12.5 g Intravenous Once  . calcitRIOL  0.5 mcg Oral Q M,W,F-HD  . carvedilol  6.25 mg Oral BID WC  . cinacalcet  30 mg Oral BID WC  . feeding supplement (NEPRO CARB STEADY)  237 mL Oral BID BM  . heparin  5,000 Units Subcutaneous Q8H  . hydrocerin   Topical BID  . isosorbide dinitrate  10 mg Oral TID  . multivitamin  1 tablet Oral QHS  . naproxen  500 mg Oral BID WC  . oxyCODONE  15 mg Oral QID  . sertraline  100 mg Oral Daily  . sevelamer carbonate  2,400 mg Oral TID WC   Continuous Infusions:    LOS: 8 days    Time spent in minutes: 35    [name] PrestoMAGICK-Treshon Stannard, MD [facility] Pager: [phone] [email or web address] Password [facility] 08/03/2016, 6:58 PM

## 2016-08-03 NOTE — Anesthesia Preprocedure Evaluation (Signed)
Anesthesia Evaluation  Patient identified by MRN, date of birth, ID band Patient awake    Reviewed: Allergy & Precautions, NPO status , Patient's Chart, lab work & pertinent test results  Airway Mallampati: I  TM Distance: >3 FB Neck ROM: Full    Dental   Pulmonary former smoker,    Pulmonary exam normal        Cardiovascular hypertension, Normal cardiovascular exam+ Cardiac Defibrillator      Neuro/Psych    GI/Hepatic GERD  Medicated and Controlled,  Endo/Other    Renal/GU ESRF and DialysisRenal disease     Musculoskeletal   Abdominal   Peds  Hematology   Anesthesia Other Findings   Reproductive/Obstetrics                             Anesthesia Physical Anesthesia Plan  ASA: III  Anesthesia Plan: General   Post-op Pain Management:    Induction: Intravenous  Airway Management Planned: LMA  Additional Equipment:   Intra-op Plan:   Post-operative Plan: Extubation in OR  Informed Consent: I have reviewed the patients History and Physical, chart, labs and discussed the procedure including the risks, benefits and alternatives for the proposed anesthesia with the patient or authorized representative who has indicated his/her understanding and acceptance.     Plan Discussed with: CRNA and Surgeon  Anesthesia Plan Comments:         Anesthesia Quick Evaluation

## 2016-08-03 NOTE — Progress Notes (Signed)
[facility] Progress Note   Subjective:  Overnight developed worsened pain in R hand and her fingers felt stiff; concerning for worsened steal syndrome. Vascular evaluated here to took her to the OR this morning for AVF removal. Seen in room afterwards, she is sleepy, but says the hand already feels better. Denies chest pain or dyspnea currently.  Objective Vitals:   08/03/16 1215 08/03/16 1230 08/03/16 1235 08/03/16 1253  BP: 113/74 114/67  96/79  Pulse: 89 93  (!) 49  Resp: 19 18  18   Temp:   97.2 F (36.2 C)   TempSrc:      SpO2: 100% 100%  95%  Weight:      Height:       Physical Exam General: Drowsy female. NAD. Resting in bed. Heart: RRR; no murmur. Lungs: CTA anteriorly, unable to sit up for posterior exam. Extremities: No LE edema. Sore RUE s/p AVG removal this morning. Dialysis Access: L femoral PC.   Additional Objective Labs: Basic Metabolic Panel:  Recent Labs Lab 07/31/16 1756 08/01/16 1421 08/02/16 1437 08/03/16 0453  NA 137 138 139 136  K 5.1 3.9 4.7 3.8  CL 97*  --  99* 98*  CO2 21*  --  22 24  GLUCOSE 122* 73 95 110*  BUN 71*  --  54* 35*  CREATININE 8.63*  --  6.00* 4.40*  CALCIUM 8.3*  --  9.0 9.3  PHOS 8.2*  --  8.5* 6.5*   Liver Function Tests:  Recent Labs Lab 07/31/16 1756 08/02/16 1437 08/03/16 0453  ALBUMIN 3.0* 3.1* 3.3*   CBC:  Recent Labs Lab 07/30/16 0423 07/31/16 0626 08/01/16 0503 08/01/16 1421 08/02/16 1438 08/03/16 0453  WBC 7.1 4.9 9.5  --  8.5 13.3*  HGB 10.7* 10.9* 10.1* 11.9* 9.5* 10.2*  HCT 34.4* 34.2* 32.2* 35.0* 30.5* 33.9*  MCV 90.3 90.0 88.7  --  88.4 93.1  PLT 115* 144* 170  --  155 203   Studies/Results: No results found. Medications:   . albumin human  12.5 g Intravenous Once  . albumin human  12.5 g Intravenous Once  . calcitRIOL  0.5 mcg Oral Q M,W,F-HD  . carvedilol  6.25 mg Oral BID WC  . cinacalcet  30 mg Oral BID WC  . feeding supplement (NEPRO CARB STEADY)  237 mL Oral  BID BM  . heparin  5,000 Units Subcutaneous Q8H  . hydrocerin   Topical BID  . isosorbide dinitrate  10 mg Oral TID  . multivitamin  1 tablet Oral QHS  . naproxen  500 mg Oral BID WC  . oxyCODONE  15 mg Oral QID  . sertraline  100 mg Oral Daily  . sevelamer carbonate  2,400 mg Oral TID WC    Dialysis Orders: Dialysis Orders: [facility] on MWF,[facility] (will be MWF 2nd shift, can start there once discharged). 4 hours, EDW 54, 2K/2Ca bath, 180 dialyzer, BFR 400/DFR 800, TDC - Heparin 2000 unit bolus - Calcitriol 0.145mcg PO q HD (last Ca 9.6, Phos 6.7, PTH 4556) - No ESA  New RUE AVG 08/01/16; removal 10/12 due to steal symptoms.  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 Found to have T7, T8, T12 vertebral compression fractures. Ortho has decided to treat conservatively (no plan  for kyphoplasty at this time). She had hyperkalemia and was volume overloaded initially due to missing dialysis due to pain; both now resolved with serial dialysis.  Assessment/Plan: 1. 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 Being managed with pain control/brace. No plan for kyphoplasty at this time. PT working with patient.  2. ESRD: HD MWF, next HD 10/13.  3. HTN/volume: BP low at times; off hydralazine. Isosorbide/coreg with holding parameters, can consider stopping isosorbide completely. Volume overload on admit, now s/p serial dialysis sessions with improved volume status. She is now roughly 3kg below her prior outpatient EDW, will need to be changed on discharge.  4. Clotted LUE dialysis access: Using femoral perm-cath. VVS consulted, and had RUE AVG placed 10/10, but developed steal syndrome so  RUE AVG removed 10/12.  5. Anemia: Hgb now 10.2; monitor.  6. Thrombocytopenia: Plts were dropping initially, now resolved.  7. Hyperkalemia: Resolved.  8. Secondary hyperparathyroidism: Severely uncontrolled with last PTH >4500. Continue current binders/VDRA/Sensipar dose for now and will adjust doses once taking meds consistently (has missed several doses of binders/sensipar this admit). Per Dr. Derrell Lolling (gen surg) notes, they recommend evaluation in tertiary care center for parathyroidectomy given severe cardiac disease/high surgical risk.  9. Non-ischemic cardiomyopathy (s/p AICD, EF 12-20%): On low dose coreg/isosorbide; off hydralazine.  10. Nutrition: Albumin 3.3; continue high protein diet and Nepro supplements.  11. Dispo: New HD unit slot arranged (MWF 2nd shift) in [place]. Can start as early as Friday 10/13, will need to be there at 10:30a to sign papers for HD start time of 11a.  Ozzie Hoyle, PA-C 08/03/2016, 2:28 PM  [facility] Pager: [phone]

## 2016-08-03 NOTE — Progress Notes (Signed)
  Progress Note    08/03/2016 10:09 AM 2 Days Post-Op  Subjective:  Having numbness in right hand that is worse this morning  Vitals:   08/03/16 0522 08/03/16 0800  BP: (!) 59/36 (!) 63/32  Pulse: (!) 104 98  Resp: 16 16  Temp: 98.2 F (36.8 C) 98.3 F (36.8 C)    Physical Exam: Neuro: aaox3 Cv: rrr Pulm: non labored Ext: R arm numb with weak grip, monophasic radial and ulnar at wrist  CBC    Component Value Date/Time   WBC 13.3 (H) 08/03/2016 0453   RBC 3.64 (L) 08/03/2016 0453   HGB 10.2 (L) 08/03/2016 0453   HCT 33.9 (L) 08/03/2016 0453   PLT 203 08/03/2016 0453   MCV 93.1 08/03/2016 0453   MCH 28.0 08/03/2016 0453   MCHC 30.1 08/03/2016 0453   RDW 13.7 08/03/2016 0453   LYMPHSABS 1.0 07/26/2016 1803   MONOABS 0.6 07/26/2016 1803   EOSABS 0.0 07/26/2016 1803   BASOSABS 0.0 07/26/2016 1803    BMET    Component Value Date/Time   NA 136 08/03/2016 0453   K 3.8 08/03/2016 0453   CL 98 (L) 08/03/2016 0453   CO2 24 08/03/2016 0453   GLUCOSE 110 (H) 08/03/2016 0453   BUN 35 (H) 08/03/2016 0453   CREATININE 4.40 (H) 08/03/2016 0453   CALCIUM 9.3 08/03/2016 0453   GFRNONAA 13 (L) 08/03/2016 0453   GFRAA 15 (L) 08/03/2016 0453    INR    Component Value Date/Time   INR 1.35 06/17/2016 0229     Intake/Output Summary (Last 24 hours) at 08/03/16 1009 Last data filed at 08/02/16 2201  Gross per 24 hour  Intake              222 ml  Output             -755 ml  Net              977 ml     Assessment:  25 y.o. female is s/p placement of right arm axillary loop av graft with significant steal vs IMN  Plan: Will need to go to OR for excision of right arm av graft   Brandon C. Randie Heinz, MD [facility] [place] Office: [phone] Pager: [phone]  08/03/2016 10:09 AM

## 2016-08-03 NOTE — Anesthesia Postprocedure Evaluation (Signed)
Anesthesia Post Note  Patient: [name] Small  Procedure(s) Performed: Procedure(s) (LRB): INSERTION OF ARTERIOVENOUS (AV) GORE-TEX GRAFT RIGHT ARM (Right)  Patient location during evaluation: PACU Anesthesia Type: General Level of consciousness: awake Vital Signs Assessment: post-procedure vital signs reviewed and stable Respiratory status: spontaneous breathing Cardiovascular status: stable Anesthetic complications: no    Last Vitals:  Vitals:   08/03/16 1235 08/03/16 1253  BP:  96/79  Pulse:  (!) 49  Resp:  18  Temp: 36.2 C     Last Pain:  Vitals:   08/03/16 1235  TempSrc:   PainSc: 0-No pain                 EDWARDS,Stephanos Fan

## 2016-08-03 NOTE — Progress Notes (Signed)
PT Cancellation Note  Patient Details Name: [name] Small MRN: [id number] DOB: [birth year]   Cancelled Treatment:    Reason Eval/Treat Not Completed: Patient at procedure or test/unavailable.  Patient in OR for excision of Rt arm AV graft.  Will return tomorrow for PT session.   [name] 08/03/2016, 10:41 AM [name]. Renaldo Fiddler, [facility] Pager [phone]

## 2016-08-03 NOTE — Transfer of Care (Signed)
Immediate Anesthesia Transfer of Care Note  Patient: [name] Small  Procedure(s) Performed: Procedure(s): REMOVAL OF ARTERIOVENOUS GORETEX GRAFT (AVGG) (Right)  Patient Location: PACU  Anesthesia Type:General  Level of Consciousness: awake, alert  and oriented  Airway & Oxygen Therapy: Patient Spontanous Breathing and Patient connected to nasal cannula oxygen  Post-op Assessment: Report given to RN, Post -op Vital signs reviewed and stable and Patient moving all extremities X 4  Post vital signs: Reviewed and stable  Last Vitals:  Vitals:   08/03/16 1204 08/03/16 1215  BP:  113/74  Pulse:  89  Resp:  19  Temp: 36.5 C     Last Pain:  Vitals:   08/03/16 1204  TempSrc:   PainSc: 0-No pain      Patients Stated Pain Goal: 2 (08/02/16 0525)  Complications: No apparent anesthesia complications

## 2016-08-03 NOTE — Anesthesia Postprocedure Evaluation (Signed)
Anesthesia Post Note  Patient: [name] Small  Procedure(s) Performed: Procedure(s) (LRB): REMOVAL OF ARTERIOVENOUS GORETEX GRAFT (AVGG) (Right)  Patient location during evaluation: PACU Anesthesia Type: General Level of consciousness: awake and alert Pain management: pain level controlled Vital Signs Assessment: post-procedure vital signs reviewed and stable Respiratory status: spontaneous breathing, nonlabored ventilation, respiratory function stable and patient connected to nasal cannula oxygen Cardiovascular status: blood pressure returned to baseline and stable Postop Assessment: no signs of nausea or vomiting Anesthetic complications: no    Last Vitals:  Vitals:   08/03/16 1230 08/03/16 1235  BP: 114/67   Pulse: 93   Resp: 18   Temp:  36.2 C    Last Pain:  Vitals:   08/03/16 1235  TempSrc:   PainSc: 0-No pain                 [name] DAVID

## 2016-08-03 NOTE — Care Management Important Message (Signed)
Important Message  Patient Details  Name: [name] Small MRN: [id number] Date of Birth: 1990-11-23   Medicare Important Message Given:  Yes    Brandom Kerwin Abena 08/03/2016, 10:01 AM

## 2016-08-03 NOTE — Progress Notes (Signed)
Patient c/o numbness and tingling in RUE, cool to touch. BP 63/32. MD notified

## 2016-08-04 ENCOUNTER — Encounter (HOSPITAL_COMMUNITY): Payer: Self-pay | Admitting: Vascular Surgery

## 2016-08-04 LAB — RENAL FUNCTION PANEL
ANION GAP: 15 (ref 5–15)
Albumin: 3.1 g/dL — ABNORMAL LOW (ref 3.5–5.0)
BUN: 46 mg/dL — ABNORMAL HIGH (ref 6–20)
CO2: 24 mmol/L (ref 22–32)
Calcium: 8.9 mg/dL (ref 8.9–10.3)
Chloride: 96 mmol/L — ABNORMAL LOW (ref 101–111)
Creatinine, Ser: 5.77 mg/dL — ABNORMAL HIGH (ref 0.44–1.00)
GFR calc Af Amer: 11 mL/min — ABNORMAL LOW (ref >60)
GFR calc non Af Amer: 9 mL/min — ABNORMAL LOW (ref >60)
GLUCOSE: 99 mg/dL (ref 65–99)
POTASSIUM: 3.7 mmol/L (ref 3.5–5.1)
Phosphorus: 6.3 mg/dL — ABNORMAL HIGH (ref 2.5–4.6)
SODIUM: 135 mmol/L (ref 135–145)

## 2016-08-04 LAB — CBC
HEMATOCRIT: 27 % — AB (ref 36.0–46.0)
HEMOGLOBIN: 8.6 g/dL — AB (ref 12.0–15.0)
MCH: 27.8 pg (ref 26.0–34.0)
MCHC: 31.9 g/dL (ref 30.0–36.0)
MCV: 87.4 fL (ref 78.0–100.0)
Platelets: 137 10*3/uL — ABNORMAL LOW (ref 150–400)
RBC: 3.09 MIL/uL — ABNORMAL LOW (ref 3.87–5.11)
RDW: 18.6 % — ABNORMAL HIGH (ref 11.5–15.5)
WBC: 7.9 10*3/uL (ref 4.0–10.5)

## 2016-08-04 MED ORDER — DARBEPOETIN ALFA 60 MCG/0.3ML IJ SOSY
60.0000 ug | PREFILLED_SYRINGE | INTRAMUSCULAR | Status: DC
  Administered 2016-08-04: 60 ug via INTRAVENOUS
  Filled 2016-08-04: qty 0.3

## 2016-08-04 MED ORDER — DIPHENHYDRAMINE HCL 50 MG/ML IJ SOLN
12.5000 mg | Freq: Once | INTRAMUSCULAR | Status: AC
  Administered 2016-08-04: 12.5 mg via INTRAVENOUS

## 2016-08-04 MED ORDER — DIPHENHYDRAMINE HCL 50 MG/ML IJ SOLN
INTRAMUSCULAR | Status: AC
  Filled 2016-08-04: qty 1

## 2016-08-04 MED ORDER — DARBEPOETIN ALFA 60 MCG/0.3ML IJ SOSY
PREFILLED_SYRINGE | INTRAMUSCULAR | Status: AC
  Filled 2016-08-04: qty 0.3

## 2016-08-04 NOTE — Progress Notes (Signed)
Patient discharged to home with family after HD. IV removed. Scripts given. Discharge instructions reviewed. All belongings with patient. Patient left unit in wheelchair in stable condition.  Avelina Laine RN

## 2016-08-04 NOTE — Progress Notes (Signed)
Physical Therapy Treatment Patient Details Name: [name] Small MRN: [id number] DOB: [birth year] Today's Date: 08/04/2016    History of Present Illness Patient is a 25 yo female admitted 07/26/16 after a fall resulting in T7, T8, T12 fractures.  Ortho - conservative treatment with TLSO.  Patient also with acute pulmonary edema, hyperkalemia (missed HD), and has been non-ambulatory since fall 07/22/16.     PMH:  ESRD on HD, NICM, EF 12-20%, AICD, HTN, anemia, CHF, sickle cell    PT Comments    Patient with improved mobility/gait.  Worked with patient/boyfriend on donning/doffing brace correctly.  Ready for d/c from PT perspective with f/u HHPT.  Follow Up Recommendations  Home health PT;Supervision for mobility/OOB     Equipment Recommendations  3in1 (PT) (3-in-1 delivered; Pt now states need for RW-HHPT to address)    Recommendations for Other Services       Precautions / Restrictions Precautions Precautions: Fall;Back Precaution Comments: Patient able to state precautions. Required Braces or Orthoses: Spinal Brace Spinal Brace: Thoracolumbosacral orthotic Restrictions Weight Bearing Restrictions: No    Mobility  Bed Mobility               General bed mobility comments: Patient sitting EOB as PT entered room.  Brace on incorrectly.  Had boyfriend and patient remove brace and reapply with min verbal cueing.  No questions.  Transfers                 General transfer comment: NT - patient had been up ambulating in room with boyfriend.  Ambulation/Gait                 Stairs            Wheelchair Mobility    Modified Rankin (Stroke Patients Only)       Balance                                    Cognition Arousal/Alertness: Awake/alert Behavior During Therapy: WFL for tasks assessed/performed Overall Cognitive Status: Within Functional Limits for tasks assessed                      Exercises      General  Comments        Pertinent Vitals/Pain Pain Assessment: 0-10 Pain Score: 4  Pain Location: back Pain Descriptors / Indicators: Aching;Sore Pain Intervention(s): Monitored during session;Repositioned    Home Living                      Prior Function            PT Goals (current goals can now be found in the care plan section) Progress towards PT goals: Progressing toward goals    Frequency    Min 5X/week      PT Plan Current plan remains appropriate    Co-evaluation             End of Session Equipment Utilized During Treatment: Back brace Activity Tolerance: Patient tolerated treatment well;Patient limited by pain Patient left: in bed;with call bell/phone within reach;with family/visitor present (sitting EOB)     Time: [id number] PT Time Calculation (min) (ACUTE ONLY): 10 min  Charges:  $Self Care/Home Management: 8-22                    G Codes:      Vena Austriaavis, [place] Adcock H  08/04/2016, 6:50 PM [name]. Renaldo Fiddler, [facility] Acute Rehab Services Pager [phone]

## 2016-08-04 NOTE — Discharge Summary (Signed)
Physician Discharge Summary  [name] Small ZOX:[id number] DOB: [birth year] DOA: 07/26/2016  PCP: [name] Lewandowsky, MD  Admit date: 07/26/2016 Discharge date: 08/04/2016  Recommendations for Outpatient Follow-up:   Pt will need to follow up with PCP in 1-2 weeks post discharge  Per vascular surgery team pt will need to f/u in 2 weeks for wound check, Dialysis via left fem tdc for now  Pt to continue with HD at [facility] MWF  Discharge Diagnoses:  Principal Problem:   Acute pulmonary edema (HCC) Active Problems:   Hyperkalemia   ESRD on dialysis [facility])   AICD (automatic cardioverter/defibrillator) present  Discharge Condition: Stable  Diet recommendation: Renal diet   Brief Narrative:  25 y.o.femalewith ESRD on hemodialysis noncompliant with dialysis, cardiomyopathy status post AICD placement as had recent stress test done in September 26 which showed EF of 12% with reversible ischemia, hypertension presented to the ER because of increasing upper back pain after a recent fall and shortness of breath. Labs show hyperkalemia. Patient states after a fall she was not able to walk due topain and had not gone to her dialysis for at least 2 sessions. EKG showed some prominent T waves. On-call nephrologist consulted and patient  taken for urgent dialysis.  Subjective: Overnight developed worsened pain in R hand and her fingers felt stiff, still with significant lower and upper back pain.   Assessment & Plan:   Principal Problem:   Acute pulmonary edema / volume overload/   ESRD on dialysis  - due to missing dialysis 2 x and medical non compliance  - appreciate Nephrology consult - HD MWF    Clotted LUE dialysis access - Using femoral perm-cath - VVS consulted, and hadRUEAVG placed10/10, but developed steal syndrome, RUE AVG removed 10/12 - taken to OR today for Excision of right upper extremity av graft/Patch angioplasty of R axillary artery - now with  Grip strength RUE improved with subjective numbness only of finger tips, Signals at radial/ulnar - follow up in 2 weeks post discharge for wound check     Secondary hyperparathyroidism - Severely uncontrolled with last PTH >4500.  - Continue current binders/VDRA/Sensipar dose for now and will adjust doses once taking meds consistently  - Dr. Derrell Lolling (gen surg) notes, they recommend evaluation in tertiary care center for parathyroidectomy given severe cardiac disease/high surgical risk.    Hypotension - hold her antihypertensives until BP stabilizes   Active Problems:   Back pain- thoracic compression fractures - as a result of a fall in setting or osteoporosis - CT showed T7 fracture 18-19 %, subtle compression deformity superior endplate of T8, probable acute/subacute fracture at T12  - pain control-   increased Percocet to 3 tabs QID, cont Naproxen and 1/2 the dose of PRN Dilaudid - Dr Shon Baton, Ortho, who recommends conservative management for now with brace and pain rather than a kyphoplasty - if her hyperparathyroidism cannot be resolved via surgery, he will consider a more permanent solution with a kyphoplasty - PRN Miralax for constipation    Hyperkalemia - due to missing dialysis - resolved     NICM (nonischemic cardiomyopathy) - EF 20-25 % - Coreg at 1/2 home dose (6.25) & Imdur - hold due to low BP    Acute thrombocytopenia - cause uncertain-receiving Heparin s/c - platelets were dropping daily- per nephrology, cont Heparin for now- checked HIT panel - platelets improving   DVT prophylaxis: heparin Code Status: Full code Family Communication: no family at bedside Disposition Plan: home  Consultants:   Nephrology  IR  Procedures/Studies: Dg Chest 2 View  Result Date: 07/26/2016 CLINICAL DATA:  Shortness of breath and chest pain with history of recent rib fractures EXAM: CHEST  2 VIEW COMPARISON:  07/23/2016 FINDINGS: Cardiac shadow remains enlarged. A  defibrillator is again seen. The lungs are well aerated bilaterally. Previously seen left basilar changes have improved in the interval from the prior exam. No sizable effusion is seen. No acute bony abnormality is noted. IMPRESSION: Improved aeration in the left lung base when compare with the prior exam. No new focal abnormality is seen. Electronically Signed   By: Alcide Clever M.D.   On: 07/26/2016 18:53   Dg Ribs Unilateral W/chest Right  Result Date: 07/23/2016 CLINICAL DATA:  Fall at home with right rib pain. Initial encounter. EXAM: RIGHT RIBS AND CHEST - 3+ VIEW COMPARISON:  07/01/2016 FINDINGS: Single chamber pacer/ICD battery pack obscures some of the right ribs. A marker is present more inferiorly and these levels are negative for acute fracture. No hemothorax or pneumothorax on the right. There is infrahilar opacity on the left with small pleural effusion. Chronic marked cardiopericardial enlargement and pulmonary venous congestion. IVC filter. IMPRESSION: 1. Negative for fracture along the symptomatic lower right ribs. No pneumothorax. 2. Left lower lobe atelectasis or pneumonia with small pleural effusion. 3. Chronic marked cardiomegaly and pulmonary venous congestion. Electronically Signed   By: Marnee Spring M.D.   On: 07/23/2016 21:53   Dg Thoracic Spine 2 View  Result Date: 07/24/2016 CLINICAL DATA:  25 y/o  F; fall at home with severe mid back pain. EXAM: THORACIC SPINE 2 VIEWS COMPARISON:  06/17/2016 CT of the chest. FINDINGS: Single lead AICD. Mild S-shaped curvature of thoracic spine. IVC filter. Mild anterior loss of height of a mid thoracic vertebral body, possibly T8, appears new from prior CT. IMPRESSION: Suspected new mild anterior compression deformity of T8. Confirmation with thoracic CT or MRI is recommended. These results will be called to the ordering clinician or representative by the Radiologist Assistant, and communication documented in the PACS or zVision Dashboard.  Electronically Signed   By: Mitzi Hansen M.D.   On: 07/24/2016 00:38   Ct Thoracic Spine Wo Contrast  Result Date: 07/27/2016 CLINICAL DATA:  25 year old female with progressive upper back pain and shortness of breath after falling. EXAM: CT THORACIC SPINE WITHOUT CONTRAST TECHNIQUE: Multidetector CT imaging of the thoracic spine was performed without intravenous contrast administration. Multiplanar CT image reconstructions were also generated. COMPARISON:  Prior chest CT 06/17/2016. Prior CT scan of the abdomen and pelvis 10/26/2015 FINDINGS: Alignment: Slightly exaggerated thoracic kyphosis. No scoliotic curvature. Vertebrae: Acute nondisplaced fracture through the posterior aspect of the right fifth rib. Subtle compression fracture of the anterior aspect of T7 with buckling of the cortex. There is approximately 18- 19% height loss anteriorly. No posterior retropulsion. Findings are new compared to prior chest CT 06/17/2016. Very subtle probable compression deformity of the superior endplate of T8 without height loss. Additionally, there is subtle buckling of the anterior cortex at T12 which is less specific but new compared to October 26, 2015. No associated height loss. Diffuse demineralization of the bones suggests osteoporosis. Paraspinal and other soft tissues: No paraspinal mass or hematoma. Disc levels: Moderately large right pleural effusion. Incompletely imaged right subclavian approach intracardiac defibrillator. Cardiomegaly. Moderate pericardial effusion. Incompletely imaged IVC filter. Numerous bilateral renal cysts and calcifications of varying complexity consistent with the clinical history of polycystic kidney disease. IMPRESSION: 1. Acute to subacute  fracture of T7 with 18-19% anterior height loss. No evidence of posterior retropulsion. 2. Suspect subtle compression deformity of the superior endplate of T8 without associated height loss. 3. Similarly, probable acute to subacute  fracture involving the anterior cortex of T12 without evidence of associated height loss. This finding is new compared to October 26, 2015 and has likely occurred at some point since that time. 4. Diffusely abnormal bone and marrow density with areas of relatively increased sclerosis and osteopenia. Findings may represent aggressive osteopenia, or renal osteodystrophy. 5. Moderately large right pleural effusion without significant interval change compared 06/17/2016. 6. Nondisplaced fracture the posterior aspect of the right fifth rib. 7. Cardiomegaly. 8. Moderate pericardial effusion. 9. Numerous renal cysts of varying complexity bilaterally. Electronically Signed   By: Malachy MoanHeath  McCullough M.D.   On: 07/27/2016 10:22    Discharge Exam: Vitals:   08/04/16 0935 08/04/16 1008  BP: (!) 89/47 (!) 90/58  Pulse: 87   Resp: 16   Temp: 97.9 F (36.6 C)    Vitals:   08/04/16 0030 08/04/16 0523 08/04/16 0935 08/04/16 1008  BP: (!) 83/65 (!) 94/43 (!) 89/47 (!) 90/58  Pulse: 99 83 87   Resp:  16 16   Temp:  98.2 F (36.8 C) 97.9 F (36.6 C)   TempSrc:  Oral Oral   SpO2:  95% 100%   Weight:      Height:        General: Pt is alert, follows commands appropriately, not in acute distress Cardiovascular: Regular rate and rhythm, S1/S2 +, no murmurs, no rubs, no gallops Respiratory: Clear to auscultation bilaterally, no wheezing, no crackles, no rhonchi Abdominal: Soft, non tender, non distended, bowel sounds +, no guarding  Discharge Instructions  Discharge Instructions    Diet - low sodium heart healthy    Complete by:  As directed    Discharge instructions    Complete by:  As directed    Renal heart healthy diet   Increase activity slowly    Complete by:  As directed    Increase activity slowly    Complete by:  As directed        Medication List    STOP taking these medications   carvedilol 12.5 MG tablet Commonly known as:  COREG   hydrALAZINE 25 MG tablet Commonly known as:   APRESOLINE   isosorbide dinitrate 10 MG tablet Commonly known as:  ISORDIL     TAKE these medications   calcitonin (salmon) 200 UNIT/ACT nasal spray Commonly known as:  MIACALCIN Place 1 spray into alternate nostrils daily.   calcitRIOL 0.5 MCG capsule Commonly known as:  ROCALTROL Take 1 capsule (0.5 mcg total) by mouth every Monday, Wednesday, and Friday with hemodialysis.   cinacalcet 30 MG tablet Commonly known as:  SENSIPAR Take 1 tablet (30 mg total) by mouth 2 (two) times daily with a meal.   colchicine 0.6 MG tablet Take 0.5 tablet (0.3 mg total) by mouth two times per week. What changed:  how much to take  how to take this  when to take this  additional instructions   famotidine 20 MG tablet Commonly known as:  PEPCID Take 1 tablet (20 mg total) by mouth 2 (two) times daily as needed for heartburn or indigestion.   LORazepam 1 MG tablet Commonly known as:  ATIVAN Take 1 tablet (1 mg total) by mouth daily as needed for anxiety. What changed:  when to take this   multivitamin Tabs tablet Take 1 tablet  by mouth daily.   naproxen 500 MG tablet Commonly known as:  NAPROSYN Take 1 tablet (500 mg total) by mouth 2 (two) times daily with a meal.   oxyCODONE 15 MG immediate release tablet Commonly known as:  ROXICODONE Take 1 tablet (15 mg total) by mouth every 6 (six) hours as needed. You must try to cut back on the frequency of these pills as your pain improves What changed:  medication strength  how much to take  reasons to take this  additional instructions   sertraline 100 MG tablet Commonly known as:  ZOLOFT Take 1 tablet (100 mg total) by mouth daily.   sevelamer carbonate 800 MG tablet Commonly known as:  RENVELA Take 2-3 tablets (1,600-2,400 mg total) by mouth 3 (three) times daily with meals. 3 tabs with meals, 2 tabs with snacks What changed:  when to take this  additional instructions      Follow-up Information    [facility] .   Why:  Home Health Services RN,PT/OT/ Home Health Aide Contact information: [address] [place] [address] [phone]        [facility] - Dme [facility] .   Why:  Bedside Commode delivered to room prior to discharge home Contact information: [address] [place] [address] [phone]        [facility] .   Why:  Hospital followup appointment Thursday, August 04, 2015 @ 3:30pm. Please call if you are unable to keep this appointment.  Contact information: [address] [place] [address] [phone]       [facility] .   Why:  Appointment Sep 06, 2015  Contact information: [address] [place] [address]       Waverly Ferrari, MD Follow up in 2 week(s).   Specialties:  Vascular Surgery, Cardiology Why:  Office will call you to arrange your appt (sent) Contact information: [address] [place] [address] [phone]            The results of significant diagnostics from this hospitalization (including imaging, microbiology, ancillary and laboratory) are listed below for reference.     Microbiology: Recent Results (from the past 240 hour(s))  MRSA PCR Screening     Status: None   Collection Time: 07/27/16  8:07 AM  Result Value Ref Range Status   MRSA by PCR NEGATIVE NEGATIVE Final    Comment:        The GeneXpert MRSA Assay (FDA approved for NASAL specimens only), is one component of a comprehensive MRSA colonization surveillance program. It is not intended to diagnose MRSA infection nor to guide or monitor treatment for MRSA infections.      Labs: Basic Metabolic Panel:  Recent Labs Lab 07/31/16 1756 08/01/16 1421 08/02/16 1437 08/03/16 0453 08/04/16 0456  NA 137 138 139 136 135  K 5.1 3.9 4.7 3.8 3.7  CL 97*  --  99* 98* 96*  CO2 21*  --  22 24 24   GLUCOSE 122* 73 95 110* 99  BUN 71*   --  54* 35* 46*  CREATININE 8.63*  --  6.00* 4.40* 5.77*  CALCIUM 8.3*  --  9.0 9.3 8.9  PHOS 8.2*  --  8.5* 6.5* 6.3*   Liver Function Tests:  Recent Labs Lab 07/31/16 1756 08/02/16 1437 08/03/16 0453 08/04/16 0456  ALBUMIN 3.0* 3.1* 3.3* 3.1*   CBC:  Recent Labs Lab  07/31/16 0626 08/01/16 0503 08/01/16 1421 08/02/16 1438 08/03/16 0453 08/04/16 0456  WBC 4.9 9.5  --  8.5 13.3* 7.9  HGB 10.9* 10.1* 11.9* 9.5* 10.2* 8.6*  HCT 34.2* 32.2* 35.0* 30.5* 33.9* 27.0*  MCV 90.0 88.7  --  88.4 93.1 87.4  PLT 144* 170  --  155 203 137*    BNP (last 3 results)  Recent Labs  09/29/15 1242 04/11/16 1130 07/26/16 1803  BNP 2,492.9* >4,500.0* >4,500.0*    SIGNED: Time coordinating discharge: 30 minutes  MAGICK-Amber Guthridge, MD  [facility] 08/04/2016, 11:28 AM Pager [phone]  If 7PM-7AM, please contact night-coverage [email or web address] Password [email or web address]

## 2016-08-04 NOTE — Progress Notes (Signed)
This RN called [employer] to let them know that Patient [name] Small will not be there today for the scheduled session and will be having HD today here at [facility]. Patient is scheduled to start Monday at 11:15am. Patient aware.   Avelina Laine RN

## 2016-08-04 NOTE — Procedures (Signed)
I have personally attended this patient's dialysis session.   Left femoral TDC 400 Goal is to EDW of 51 - only 0.9 up, no edema and soft BP so 0.5 goal  K 3.7 4K bath  Camille Bal, MD [facility] [phone] Pager 08/04/2016, 2:15 PM

## 2016-08-04 NOTE — Progress Notes (Signed)
[facility] Rounding Note  Subjective:   Eating breakfast with  back brace on back  Pain controlled with  pain meds,for HD today  Reports sitting up in chair in room and tolerating /does not want to try recliner chair HD this am and aware as OP will be in HD chair  Pain in R Hand resolved post op AV graft  ligation yest .   Objective Vital signs in last 24 hours: Vitals:   08/03/16 1806 08/03/16 2149 08/04/16 0030 08/04/16 0523  BP: 107/61 (!) 73/56 (!) 83/65 (!) 94/43  Pulse: 90 86 99 83  Resp: 18 16  16   Temp: 98.1 F (36.7 C) 98 F (36.7 C)  98.2 F (36.8 C)  TempSrc: Oral Oral  Oral  SpO2: 100% 100%  95%  Weight:  51.3 kg (113 lb)    Height:       Weight change: 1.456 kg (3 lb 3.4 oz)  Physical Exam General: Alert Ox3, NAD.sitting upright eating brk . Heart: RRR; no murmur. Lungs: CTA bilat  Extremities: No pedal  edema.s/p  RUA AVG removal yest.surgical  Site stable  Dialysis Access: L femoral PC  OP Dialysis Orders: [facility] on MWF,[facility] (will be MWF 2nd shift, can start there once discharged). 4 hours, EDW 54, 2K/2Ca bath, 180 dialyzer, BFR 400/DFR 800, TDC - Heparin 2000 unit bolus - Calcitriol 0.435mcg PO q HD (last Ca 9.6, Phos 6.7, PTH 4556) - No ESA  New RUE AVG 08/01/16; removal 10/12 due to steal symptoms. [place] PlanL Fem Perm cath    Labs: Basic Metabolic Panel:  Recent Labs Lab 08/02/16 1437 08/03/16 0453 08/04/16 0456  NA 139 136 135  K 4.7 3.8 3.7  CL 99* 98* 96*  CO2 22 24 24   GLUCOSE 95 110* 99  BUN 54* 35* 46*  CREATININE 6.00* 4.40* 5.77*  CALCIUM 9.0 9.3 8.9  PHOS 8.5* 6.5* 6.3*     Recent Labs Lab 08/02/16 1437 08/03/16 0453 08/04/16 0456  ALBUMIN 3.1* 3.3* 3.1*     Recent Labs Lab 07/31/16 0626 08/01/16 0503  08/02/16 1438 08/03/16 0453 08/04/16 0456  WBC 4.9 9.5  --  8.5 13.3* 7.9  HGB 10.9* 10.1*  < > 9.5* 10.2* 8.6*  HCT 34.2* 32.2*  < > 30.5* 33.9* 27.0*  MCV 90.0 88.7  --  88.4 93.1 87.4  PLT  144* 170  --  155 203 137*  < > = values in this interval not displayed.  Medications:   . albumin human  12.5 g Intravenous Once  . albumin human  12.5 g Intravenous Once  . calcitRIOL  0.5 mcg Oral Q M,W,F-HD  . carvedilol  6.25 mg Oral BID WC  . cinacalcet  30 mg Oral BID WC  . feeding supplement (NEPRO CARB STEADY)  237 mL Oral BID BM  . heparin  5,000 Units Subcutaneous Q8H  . hydrocerin   Topical BID  . isosorbide dinitrate  10 mg Oral TID  . multivitamin  1 tablet Oral QHS  . naproxen  500 mg Oral BID WC  . oxyCODONE  15 mg Oral QID  . sertraline  100 mg Oral Daily  . sevelamer carbonate  2,400 mg Oral TID WC   Background: Ms. 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 Found  to have T7, T8, T12 vertebral compression fractures. Ortho has decided to treat conservatively (no plan for kyphoplasty at this time). She had hyperkalemia and was volume overloaded initially due to missing dialysis due to pain; both now resolved with serial dialysis.  .Problem/Plan  1. 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 Being managed with pain control/brace. No plan for kyphoplasty at this time. PT working with patient.Has been up in bedside chair  And pt stated she can tolerate op hd in chair but refused recliner hd today/ pain meds per DC team and pt made aware of need for PCP/ Pain Center to manage her op pain meds NOT NEPHRO Team .  2. ESRD: HD MWF, next HD today  With [facility] .  3. HTN/volume: BP low at times; And ASYMPTOMATIC /off hydralazine./  Isosorbide/coreg with holding parameters, will dc isosorbide/ may benefit  With  Midodrine fu bp trend off isosorbide /  Volume overload on admit, now s/p serial dialysis  sessions with improved volume status. She is now roughly 3kg below her prior outpatient EDW, will need to be changed on discharge.  4. Clotted LUE dialysis access: Using femoral perm-cath. VVS consulted, and had RUEAVG placed 10/10, but developed steal syndrome so RUE AVG removed 10/12.  5. Anemia: Hgb now 10.2 > 8.6  Start Aranesp today 60 mcg  On HD .  6. Thrombocytopenia: Plts were dropping initially,up and now 137 this am   7. Hyperkalemia: Resolved.with HD  8. Secondary hyperparathyroidism: Severely uncontrolled with last PTH >4500. Continue current binders/VDRA/Sensipar dose for now and will adjust doses once taking meds consistently (has missed several doses of binders/sensipar this admit). Per Dr. Derrell Lolling (gen surg) notes, they recommend evaluation in tertiary care center for parathyroidectomy given severe cardiac disease/high surgical risk.  9. Non-ischemic cardiomyopathy (s/p AICD, EF 12-20%): On low dose coreg/isosorbide; off hydralazine.  10. Nutrition: Albumin 3.3; continue high protein diet and Nepro supplements.  11. Dispo: New HD unit slot arranged (MWF 2nd shift) in [employer].10/16  will need to be there at 10:30a to sign papers for HD start time of 11 am    Lenny Pastel, PA-C [facility] [phone] 08/04/2016,8:04 AM  LOS: 9 days   I have seen and examined this patient and agree with plan and assessment in the above note with renal recommendations/intervention highlighted. Pt with improved steal sx s/p R arm graft ligation. Not sure what next access plan will be. Still requiring quite a bit of pain mediation for her back and is asking for dilaudid IV regularly.  This will be ultimate biggest impediment to her discharge. HD today.  Nancyjo Givhan B,MD 08/04/2016 2:11 PM

## 2016-08-04 NOTE — Progress Notes (Signed)
  Progress Note    08/04/2016 5:36 PM 1 Day Post-Op  Subjective:  Hand with improved numbness today  Vitals:   08/04/16 1700 08/04/16 1728  BP: (!) 103/57 (!) 88/60  Pulse: 88 87  Resp:    Temp:      Physical Exam: Grip strength RUE improved with subjective numbness only of finger tips Signals at radial/ulnar Wounds cdi upper arm on right  CBC    Component Value Date/Time   WBC 7.9 08/04/2016 0456   RBC 3.09 (L) 08/04/2016 0456   HGB 8.6 (L) 08/04/2016 0456   HCT 27.0 (L) 08/04/2016 0456   PLT 137 (L) 08/04/2016 0456   MCV 87.4 08/04/2016 0456   MCH 27.8 08/04/2016 0456   MCHC 31.9 08/04/2016 0456   RDW 18.6 (H) 08/04/2016 0456   LYMPHSABS 1.0 07/26/2016 1803   MONOABS 0.6 07/26/2016 1803   EOSABS 0.0 07/26/2016 1803   BASOSABS 0.0 07/26/2016 1803    BMET    Component Value Date/Time   NA 135 08/04/2016 0456   K 3.7 08/04/2016 0456   CL 96 (L) 08/04/2016 0456   CO2 24 08/04/2016 0456   GLUCOSE 99 08/04/2016 0456   BUN 46 (H) 08/04/2016 0456   CREATININE 5.77 (H) 08/04/2016 0456   CALCIUM 8.9 08/04/2016 0456   GFRNONAA 9 (L) 08/04/2016 0456   GFRAA 11 (L) 08/04/2016 0456    INR    Component Value Date/Time   INR 1.35 06/17/2016 0229     Intake/Output Summary (Last 24 hours) at 08/04/16 1736 Last data filed at 08/04/16 0900  Gross per 24 hour  Intake              360 ml  Output                0 ml  Net              360 ml     Assessment:  25 y.o. female s/p graft excsion rue for steal  Plan: Will need to f/u in 2 weeks for wound check Dialysis via left fem tdc for now   [place] C. Randie Heinz, MD [facility] Office: [phone] Pager: [phone]  08/04/2016 5:36 PM

## 2016-08-08 ENCOUNTER — Telehealth: Payer: Self-pay | Admitting: Vascular Surgery

## 2016-08-08 NOTE — Telephone Encounter (Signed)
-----   Message from Sharee Pimple, RN sent at 08/05/2016 11:15 PM EDT ----- Regarding: 2 weeks    ----- Message ----- From: Maeola Harman, MD Sent: 08/04/2016   5:38 PM To: Vvs Charge Pool  F/u in 2-3 weeks with myself, Dr. Edilia Bo or np for wound check

## 2016-08-08 NOTE — Telephone Encounter (Signed)
lvm for appt on 11/6

## 2016-08-09 ENCOUNTER — Encounter: Payer: Medicare Other | Admitting: Vascular Surgery

## 2016-08-12 ENCOUNTER — Emergency Department (HOSPITAL_COMMUNITY): Payer: Medicare Other

## 2016-08-12 ENCOUNTER — Inpatient Hospital Stay (HOSPITAL_COMMUNITY)
Admission: EM | Admit: 2016-08-12 | Discharge: 2016-08-18 | DRG: 291 | Disposition: A | Discharge status: Rehabilitation Facility | Payer: Medicare Other | Attending: Internal Medicine | Admitting: Internal Medicine

## 2016-08-12 DIAGNOSIS — R079 Chest pain, unspecified: Secondary | ICD-10-CM | POA: Diagnosis not present

## 2016-08-12 DIAGNOSIS — N2581 Secondary hyperparathyroidism of renal origin: Secondary | ICD-10-CM | POA: Diagnosis present

## 2016-08-12 DIAGNOSIS — T8612 Kidney transplant failure: Secondary | ICD-10-CM | POA: Diagnosis present

## 2016-08-12 DIAGNOSIS — N186 End stage renal disease: Secondary | ICD-10-CM | POA: Diagnosis present

## 2016-08-12 DIAGNOSIS — I32 Pericarditis in diseases classified elsewhere: Secondary | ICD-10-CM

## 2016-08-12 DIAGNOSIS — S22089A Unspecified fracture of T11-T12 vertebra, initial encounter for closed fracture: Secondary | ICD-10-CM | POA: Diagnosis present

## 2016-08-12 DIAGNOSIS — Z86711 Personal history of pulmonary embolism: Secondary | ICD-10-CM

## 2016-08-12 DIAGNOSIS — I248 Other forms of acute ischemic heart disease: Secondary | ICD-10-CM | POA: Diagnosis present

## 2016-08-12 DIAGNOSIS — K219 Gastro-esophageal reflux disease without esophagitis: Secondary | ICD-10-CM | POA: Diagnosis present

## 2016-08-12 DIAGNOSIS — I255 Ischemic cardiomyopathy: Secondary | ICD-10-CM | POA: Diagnosis present

## 2016-08-12 DIAGNOSIS — Q6119 Other polycystic kidney, infantile type: Secondary | ICD-10-CM

## 2016-08-12 DIAGNOSIS — D571 Sickle-cell disease without crisis: Secondary | ICD-10-CM | POA: Diagnosis present

## 2016-08-12 DIAGNOSIS — N189 Chronic kidney disease, unspecified: Secondary | ICD-10-CM

## 2016-08-12 DIAGNOSIS — R778 Other specified abnormalities of plasma proteins: Secondary | ICD-10-CM | POA: Diagnosis present

## 2016-08-12 DIAGNOSIS — I132 Hypertensive heart and chronic kidney disease with heart failure and with stage 5 chronic kidney disease, or end stage renal disease: Principal | ICD-10-CM | POA: Diagnosis present

## 2016-08-12 DIAGNOSIS — Z841 Family history of disorders of kidney and ureter: Secondary | ICD-10-CM

## 2016-08-12 DIAGNOSIS — I3139 Other pericardial effusion (noninflammatory): Secondary | ICD-10-CM

## 2016-08-12 DIAGNOSIS — E875 Hyperkalemia: Secondary | ICD-10-CM

## 2016-08-12 DIAGNOSIS — I428 Other cardiomyopathies: Secondary | ICD-10-CM

## 2016-08-12 DIAGNOSIS — W19XXXA Unspecified fall, initial encounter: Secondary | ICD-10-CM | POA: Diagnosis present

## 2016-08-12 DIAGNOSIS — Z992 Dependence on renal dialysis: Secondary | ICD-10-CM

## 2016-08-12 DIAGNOSIS — Z87891 Personal history of nicotine dependence: Secondary | ICD-10-CM

## 2016-08-12 DIAGNOSIS — R748 Abnormal levels of other serum enzymes: Secondary | ICD-10-CM | POA: Diagnosis present

## 2016-08-12 DIAGNOSIS — F329 Major depressive disorder, single episode, unspecified: Secondary | ICD-10-CM | POA: Diagnosis present

## 2016-08-12 DIAGNOSIS — Z91158 Patient's noncompliance with renal dialysis for other reason: Secondary | ICD-10-CM

## 2016-08-12 DIAGNOSIS — Z9119 Patient's noncompliance with other medical treatment and regimen: Secondary | ICD-10-CM

## 2016-08-12 DIAGNOSIS — Z95810 Presence of automatic (implantable) cardiac defibrillator: Secondary | ICD-10-CM | POA: Diagnosis present

## 2016-08-12 DIAGNOSIS — M810 Age-related osteoporosis without current pathological fracture: Secondary | ICD-10-CM | POA: Diagnosis present

## 2016-08-12 DIAGNOSIS — D631 Anemia in chronic kidney disease: Secondary | ICD-10-CM | POA: Diagnosis present

## 2016-08-12 DIAGNOSIS — I313 Pericardial effusion (noninflammatory): Secondary | ICD-10-CM | POA: Diagnosis present

## 2016-08-12 DIAGNOSIS — Z9115 Patient's noncompliance with renal dialysis: Secondary | ICD-10-CM

## 2016-08-12 DIAGNOSIS — Z7901 Long term (current) use of anticoagulants: Secondary | ICD-10-CM

## 2016-08-12 DIAGNOSIS — I5023 Acute on chronic systolic (congestive) heart failure: Secondary | ICD-10-CM

## 2016-08-12 DIAGNOSIS — R7989 Other specified abnormal findings of blood chemistry: Secondary | ICD-10-CM | POA: Diagnosis present

## 2016-08-12 DIAGNOSIS — Z8271 Family history of polycystic kidney: Secondary | ICD-10-CM

## 2016-08-12 DIAGNOSIS — S22069A Unspecified fracture of T7-T8 vertebra, initial encounter for closed fracture: Secondary | ICD-10-CM | POA: Diagnosis present

## 2016-08-12 HISTORY — DX: Unspecified fracture of unspecified thoracic vertebra, initial encounter for closed fracture: S22.009A

## 2016-08-12 HISTORY — DX: Cardiac murmur, unspecified: R01.1

## 2016-08-12 HISTORY — DX: Acute embolism and thrombosis of unspecified deep veins of unspecified lower extremity: I82.409

## 2016-08-12 LAB — BASIC METABOLIC PANEL
ANION GAP: 13 (ref 5–15)
BUN: 84 mg/dL — AB (ref 6–20)
CO2: 24 mmol/L (ref 22–32)
Calcium: 8.9 mg/dL (ref 8.9–10.3)
Chloride: 103 mmol/L (ref 101–111)
Creatinine, Ser: 10.98 mg/dL — ABNORMAL HIGH (ref 0.44–1.00)
GFR, EST AFRICAN AMERICAN: 5 mL/min — AB (ref >60)
GFR, EST NON AFRICAN AMERICAN: 4 mL/min — AB (ref >60)
Glucose, Bld: 72 mg/dL (ref 65–99)
POTASSIUM: 5.3 mmol/L — AB (ref 3.5–5.1)
SODIUM: 140 mmol/L (ref 135–145)

## 2016-08-12 LAB — CBC
HEMATOCRIT: 33.2 % — AB (ref 36.0–46.0)
Hemoglobin: 10.6 g/dL — ABNORMAL LOW (ref 12.0–15.0)
MCH: 27.6 pg (ref 26.0–34.0)
MCHC: 31.9 g/dL (ref 30.0–36.0)
MCV: 86.5 fL (ref 78.0–100.0)
Platelets: 276 10*3/uL (ref 150–400)
RBC: 3.84 MIL/uL — AB (ref 3.87–5.11)
RDW: 19.5 % — AB (ref 11.5–15.5)
WBC: 8.4 10*3/uL (ref 4.0–10.5)

## 2016-08-12 LAB — I-STAT TROPONIN, ED: Troponin i, poc: 0.23 ng/mL (ref 0.00–0.08)

## 2016-08-12 NOTE — ED Provider Notes (Signed)
[facility]-EMERGENCY DEPT Provider Note   CSN: [id number] Arrival date & time: 08/12/16  2106  History   Chief Complaint Chief Complaint  Patient presents with  . Chest Pain  . Leg Swelling    HPI [name] Small is a 25 y.o. female.  HPI  25 y.o. female with a hx of ESRD on hemodialysis at [facility] MWF, cardiomyopathy s/p AICD placement with recent Stress on 26th which showed EF 12% with reversible ischemia, presents to the Emergency Department today complaining of chest pain this morning while ambulating to car. Notes pain has been constant all day without relief. Notes pain substernal. No radiation. No diaphoresis. No N/V. Pt missed Dialysis x 1 week. Pt states that poor follow up was arranged. States she also decided not to go to Dialysis on Friday because she wasn't feeling right. Recently DCed from hospital on 08-04-16 due to acute pulmonary edema from missing dialysis. Had initial Potassium 7 during admission. Currently using femoral perm-cath on left side. Had RUE AVG placed on 08-01-16, but developed steal syndrome, RUE removed on 08-03-16. Has continued swelling to affected area. Also of note, pt has compression fractures of T7- T8. In brace currently from previous fall in past. No fevers. No other symptoms noted.   Past Medical History:  Diagnosis Date  . 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 Anemia   . Anxiety   . Cardiomyopathy   . Cellulitis and abscess of face 03/22/2013  . CHF (congestive heart failure) (HCC)   . Chronic anticoagulation   . Chronic pain   . Depression   . Dysrhythmia    at times per pt.  . 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 ESRD (end stage renal disease) on dialysis [facility])    "MWF; [facility]" (04/11/2016)  . GERD (gastroesophageal reflux disease)   . H/O transfusion of  packed red blood cells   . Hemodialysis patient (HCC)    Mon. Wed. Fri  . Hyperkalemia 09/2015  . Hypertension   . Narcotic abuse, continuous   . Osteoporosis   . Pelvic fracture (HCC) 06/2015   "on the right"  . Polycystic kidney disease   . Pulmonary emboli (HCC) 01/2012   Bilateral, moderate clot burden, areas of pulmonary infarction and central necrosis  . Renal insufficiency   . Sickle cell anemia [facility])     Patient Active Problem List   Diagnosis Date Noted  . 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 Osteoporosis 08/01/2016  . Thoracic compression fracture - T7, T8, T12 07/27/2016  . Acute pulmonary edema (HCC) 07/26/2016  . Essential hypertension 06/17/2016  . Depression with anxiety 06/17/2016  . Volume overload 04/14/2016  . Peripheral edema 04/14/2016  . NICM (nonischemic cardiomyopathy) (HCC) 04/14/2016  . Internal jugular (IJ) vein thromboembolism, acute (HCC) 04/14/2016  . Pericardial effusion 04/14/2016  . Chronic anticoagulation   . Hypertensive urgency 04/11/2016  . DVT (deep venous thrombosis), left 04/11/2016  . Cellulitis of left upper extremity 03/31/2016  . Chronic pain syndrome 02/29/2016  . Chronic combined systolic and diastolic congestive heart failure (HCC) 01/04/2016  . AICD (automatic cardioverter/defibrillator) present 01/04/2016  . 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 Pathologic pelvic fracture 01/04/2016  . Depression 01/04/2016  . Pain in the chest   . ESRD on dialysis (HCC)   . Tachycardia   . Elevated  serum hCG   . Chest pain 10/11/2015  . Fluid overload 10/10/2015  . Hypertension 10/10/2015  . Anemia of chronic renal failure, stage 5 (HCC) 10/04/2015  . Thrombocytopenia (HCC) 10/04/2015  . Chest pain of uncertain etiology 10/04/2015  . Chronically Elevated troponin 03/03/2015  . Hyperkalemia 02/21/2015  . Acute on chronic systolic heart failure (HCC) 02/21/2012  . Pulmonary infarct (HCC)  02/07/2012  . ESRD (end stage renal disease) on dialysis (HCC) 02/03/2012  . PE (pulmonary embolism) 02/03/2012    Past Surgical History:  Procedure Laterality Date  . AV FISTULA PLACEMENT Bilateral    "right side stopped working & never had it revised" (04/11/2016)  . AV FISTULA PLACEMENT Right 08/01/2016   Procedure: INSERTION OF ARTERIOVENOUS (AV) GORE-TEX GRAFT RIGHT ARM;  Surgeon: Chuck Hint, MD;  Location: [facility] OR;  Service: Vascular;  Laterality: Right;  . AVGG REMOVAL Right 08/03/2016   Procedure: REMOVAL OF ARTERIOVENOUS GORETEX GRAFT (AVGG);  Surgeon: Maeola Harman, MD;  Location: [facility] OR;  Service: Vascular;  Laterality: Right;  . CARDIAC CATHETERIZATION    . IMPLANTABLE CARDIOVERTER DEFIBRILLATOR IMPLANT Right 12/2014  . INCISION AND DRAINAGE ABSCESS Right 03/21/2013   Procedure: INCISION AND DRAINAGE RIGHT CHEEK ABSCESS REMOVAL OF FOREIGN BODY;  Surgeon: Serena Colonel, MD;  Location[facility] OR;  Service: ENT;  Laterality: Right;  . INSERTION OF DIALYSIS CATHETER Right Mar 09, 2016   thigh, Dr. Wyn Quaker, [facility]  . 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 NEPHRECTOMY Right    third kidney placed in 2008, and body rejected in 2012   . PERIPHERAL VASCULAR CATHETERIZATION Left 03/09/2016   Procedure: A/V Shuntogram/Fistulagram;  Surgeon: Annice Needy, MD;  Location: [facility] INVASIVE CV LAB;  Service: Cardiovascular;  Laterality: Left;  . PERIPHERAL VASCULAR CATHETERIZATION N/A 05/09/2016   Procedure: Dialysis/Perma Catheter Removal;  Surgeon: Renford Dills, MD;  Location: [facility] INVASIVE CV LAB;  Service: Cardiovascular;  Laterality: N/A;  . PERIPHERAL VASCULAR CATHETERIZATION N/A 07/12/2016   Procedure: Dialysis/Perma Catheter Insertion;  Surgeon: Annice Needy, MD;  Location: [facility] INVASIVE CV LAB;  Service: Cardiovascular;  Laterality: N/A;  . PERIPHERAL VASCULAR CATHETERIZATION Right 07/31/2016   Procedure: Upper Extremity Venography;  Surgeon: Maeola Harman, MD;  Location:  [facility] INVASIVE CV LAB;  Service: Cardiovascular;  Laterality: Right;  . REVISON OF ARTERIOVENOUS FISTULA Left 03/22/2016   Procedure: REVISON OF ARTERIOVENOUS FISTULA ( ARTEGRAFT );  Surgeon: Annice Needy, MD;  Location: [facility] ORS;  Service: Vascular;  Laterality: Left;  . TONSILLECTOMY AND ADENOIDECTOMY  ~ 2000    OB History    No data available       Home Medications    Prior to Admission medications   Medication Sig Start Date End Date Taking? Authorizing Provider  calcitonin, salmon, (MIACALCIN) 200 UNIT/ACT nasal spray Place 1 spray into alternate nostrils daily. 08/02/16  Yes Dorothea Ogle, MD  calcitRIOL (ROCALTROL) 0.5 MCG capsule Take 1 capsule (0.5 mcg total) by mouth every Monday, Wednesday, and Friday with hemodialysis. 08/02/16  Yes Dorothea Ogle, MD  cinacalcet (SENSIPAR) 30 MG tablet Take 1 tablet (30 mg total) by mouth 2 (two) times daily with a meal. 08/02/16  Yes Dorothea Ogle, MD  colchicine 0.6 MG tablet Take 0.5 tablet (0.3 mg total) by mouth two times per week. Patient taking differently: Take 0.6 mg by mouth 2 (two) times a week.  07/04/16  Yes Will Jorja Loa, MD  famotidine (PEPCID) 20 MG tablet Take 1 tablet (  20 mg total) by mouth 2 (two) times daily as needed for heartburn or indigestion. 08/02/16  Yes Dorothea OgleIskra M Myers, MD  LORazepam (ATIVAN) 1 MG tablet Take 1 tablet (1 mg total) by mouth daily as needed for anxiety. Patient taking differently: Take 1 mg by mouth every morning.  07/20/16  Yes Quentin Angstlugbemiga E Jegede, MD  multivitamin (RENA-VIT) TABS tablet Take 1 tablet by mouth daily. 01/04/16  Yes Quentin Angstlugbemiga E Jegede, MD  naproxen (NAPROSYN) 500 MG tablet Take 1 tablet (500 mg total) by mouth 2 (two) times daily with a meal. 08/02/16  Yes Dorothea OgleIskra M Myers, MD  oxyCODONE (ROXICODONE) 15 MG immediate release tablet Take 1 tablet (15 mg total) by mouth every 6 (six) hours as needed. You must try to cut back on the frequency of these pills as your pain improves 08/02/16  Yes  Dorothea OgleIskra M Myers, MD  sertraline (ZOLOFT) 100 MG tablet Take 1 tablet (100 mg total) by mouth daily. 03/13/16  Yes Quentin Angstlugbemiga E Jegede, MD  sevelamer carbonate (RENVELA) 800 MG tablet Take 2-3 tablets (1,600-2,400 mg total) by mouth 3 (three) times daily with meals. 3 tabs with meals, 2 tabs with snacks Patient taking differently: Take 1,600-2,400 mg by mouth See admin instructions. 2,400 mg three times a day with each meal and 1,600 mg with snacks 01/04/16 01/03/17 Yes Olugbemiga Annitta NeedsE Jegede, MD    Family History Family History  Problem Relation Age of Onset  . Polycystic kidney disease Father   . Hypertension Father     Social History Social History  Substance Use Topics  . Smoking status: Former Smoker    Packs/day: 0.00    Years: 1.00    Types: Cigarettes    Quit date: 12/08/2015  . Smokeless tobacco: Never Used  . Alcohol use No     Allergies   Aspirin; Morphine and related; Tramadol; Vicodin [hydrocodone-acetaminophen]; Buprenorphine hcl; and Iohexol   Review of Systems Review of Systems ROS reviewed and all are negative for acute change except as noted in the HPI.  Physical Exam Updated Vital Signs BP (!) 137/101 (BP Location: Left Wrist)   Pulse 103   Temp 98.6 F (37 C) (Oral)   Resp 16   SpO2 99%   Physical Exam  Constitutional: She is oriented to person, place, and time. Vital signs are normal. She appears well-developed and well-nourished.  Pt in Back Brace. NAD  HENT:  Head: Normocephalic and atraumatic.  Right Ear: Hearing normal.  Left Ear: Hearing normal.  Eyes: Conjunctivae and EOM are normal. Pupils are equal, round, and reactive to light.  Neck: Normal range of motion. Neck supple.  Cardiovascular: Regular rhythm and intact distal pulses.  Tachycardia present.  Exam reveals friction rub.   Pulmonary/Chest: Effort normal and breath sounds normal.  Abdominal: Soft.  Musculoskeletal: Normal range of motion.  3+ pitting edema from ankles to knees BLE. No  calf tenderness. Symmetric in size. NVI. Distal pulses appreciated.  RUE with edema noted as well. Motor/senastion intact.   Neurological: She is alert and oriented to person, place, and time.  Skin: Skin is warm and dry.  Psychiatric: She has a normal mood and affect. Her speech is normal and behavior is normal. Thought content normal.  Nursing note and vitals reviewed.  ED Treatments / Results  Labs (all labs ordered are listed, but only abnormal results are displayed) Labs Reviewed  BASIC METABOLIC PANEL - Abnormal; Notable for the following:       Result Value  Potassium 5.3 (*)    BUN 84 (*)    Creatinine, Ser 10.98 (*)    GFR calc non Af Amer 4 (*)    GFR calc Af Amer 5 (*)    All other components within normal limits  CBC - Abnormal; Notable for the following:    RBC 3.84 (*)    Hemoglobin 10.6 (*)    HCT 33.2 (*)    RDW 19.5 (*)    All other components within normal limits  I-STAT TROPOININ, ED - Abnormal; Notable for the following:    Troponin i, poc 0.23 (*)    All other components within normal limits   EKG  EKG Interpretation  Date/Time:  Saturday August 12 2016 21:13:40 EDT Ventricular Rate:  105 PR Interval:    QRS Duration: 88 QT Interval:  402 QTC Calculation: 531 R Axis:   107 Text Interpretation:  Accelerated Junctional rhythm Rightward axis T wave abnormality, consider lateral ischemia Prolonged QT Abnormal ECG Confirmed by Ranae Palms  MD, DAVID ([id number]) on 08/12/2016 10:36:57 PM      Radiology Dg Chest 2 View  Result Date: 08/12/2016 CLINICAL DATA:  Chest pain with dyspnea EXAM: CHEST  2 VIEW COMPARISON:  07/27/2016 thoracic spine CT, chest CT from 06/17/2016 FINDINGS: There is cardiomegaly with bibasilar atelectasis and right small pleural effusion. The aorta is not aneurysmal. Right-sided AICD device with right ventricular lead is again seen which appears intact. There is 50% compression of what may represent the T12 vertebral body when using the  apex of a pre-existing IVC filter to determine levels based on the filter location on prior CT. The T7 compression fracture is partially obscured by the patient's chair vascular clips project over the visualized distal right arm and elbow. IMPRESSION: Stable cardiomegaly. Bibasilar atelectasis with small right pleural effusion. There appears to be a 50% compression fracture of what may represent T12, new since previous thoracic spine CT from 07/27/2016. Electronically Signed   By: Tollie Eth M.D.   On: 08/12/2016 22:38   Procedures Procedures (including critical care time)  Medications Ordered in ED Medications - No data to display  Initial Impression / Assessment and Plan / ED Course  I have reviewed the triage vital signs and the nursing notes.  Pertinent labs & imaging results that were available during my care of the patient were reviewed by me and considered in my medical decision making (see chart for details).  Clinical Course    Final Clinical Impressions(s) / ED Diagnoses  I have reviewed and evaluated the relevant laboratory values I have reviewed and evaluated the relevant imaging studies.  I have interpreted the relevant EKG. I have reviewed the relevant previous healthcare records. I have reviewed EMS Documentation. I obtained HPI from historian. Patient discussed with supervising physician  ED Course:  Assessment: Pt is a 25yF with hx ESRD on hemodialysis at [facility] MWF, cardiomyopathy s/p AICD placement with recent Stress on 26th which showed EF 12% with reversible ischemia who presents with chest pain this AM that has been continuous as well as missed dialysis sessions. She is MWF. On exam, pt in NAD. Nontoxic/nonseptic appearing. VSS. Afebrile. Heart RRR, but with friction rub present. Abdomen nontender soft. Pitting edema BLE and RUE. iStat Trop 0.23. Potassium 5.3. BUN 84. Cr 10.98. CXR with stable cardiomegaly. No pulmonary edema. Bedside Ultrasound  showed moderate pericardial effusion without tamponade. Likely due to missed HD sessions x 1 week. Seen by supervising physician. Plan is to Admit  to medicine.    10:52 PM- Nephrology consult (Dr. Arta Silence) does not feel emergent dialysis required. Will see tomorrow.  Consult to Cardiology due to elevated Troponin. Will see patient.   Disposition/Plan:  Admit Pt acknowledges and agrees with plan  Supervising Physician Loren Racer, MD   Final diagnoses:  Chest pain, unspecified type  Pericardial effusion  Hyperkalemia    New Prescriptions New Prescriptions   No medications on file     Audry Pili, PA-C 08/13/16 0005    Loren Racer, MD 08/18/16 0110

## 2016-08-12 NOTE — ED Triage Notes (Signed)
Patient arrives with numerous complaints. Chest pain is primary. Also recently admitted with spinal fractures and broken ribs per patient. ESRD; has not been to diaysis in a week.

## 2016-08-13 DIAGNOSIS — D509 Iron deficiency anemia, unspecified: Secondary | ICD-10-CM | POA: Diagnosis not present

## 2016-08-13 DIAGNOSIS — Z885 Allergy status to narcotic agent status: Secondary | ICD-10-CM | POA: Diagnosis not present

## 2016-08-13 DIAGNOSIS — Z91041 Radiographic dye allergy status: Secondary | ICD-10-CM | POA: Diagnosis not present

## 2016-08-13 DIAGNOSIS — F3341 Major depressive disorder, recurrent, in partial remission: Secondary | ICD-10-CM | POA: Diagnosis not present

## 2016-08-13 DIAGNOSIS — I509 Heart failure, unspecified: Secondary | ICD-10-CM | POA: Diagnosis present

## 2016-08-13 DIAGNOSIS — I8291 Chronic embolism and thrombosis of unspecified vein: Secondary | ICD-10-CM | POA: Diagnosis not present

## 2016-08-13 DIAGNOSIS — R0602 Shortness of breath: Secondary | ICD-10-CM | POA: Diagnosis not present

## 2016-08-13 DIAGNOSIS — Z992 Dependence on renal dialysis: Secondary | ICD-10-CM | POA: Diagnosis not present

## 2016-08-13 DIAGNOSIS — T8612 Kidney transplant failure: Secondary | ICD-10-CM | POA: Diagnosis present

## 2016-08-13 DIAGNOSIS — R079 Chest pain, unspecified: Secondary | ICD-10-CM

## 2016-08-13 DIAGNOSIS — S22089A Unspecified fracture of T11-T12 vertebra, initial encounter for closed fracture: Secondary | ICD-10-CM | POA: Diagnosis present

## 2016-08-13 DIAGNOSIS — Z8271 Family history of polycystic kidney: Secondary | ICD-10-CM | POA: Diagnosis not present

## 2016-08-13 DIAGNOSIS — I248 Other forms of acute ischemic heart disease: Secondary | ICD-10-CM | POA: Diagnosis present

## 2016-08-13 DIAGNOSIS — I82519 Chronic embolism and thrombosis of unspecified femoral vein: Secondary | ICD-10-CM | POA: Diagnosis not present

## 2016-08-13 DIAGNOSIS — M4854XD Collapsed vertebra, not elsewhere classified, thoracic region, subsequent encounter for fracture with routine healing: Secondary | ICD-10-CM | POA: Diagnosis present

## 2016-08-13 DIAGNOSIS — G8929 Other chronic pain: Secondary | ICD-10-CM | POA: Diagnosis not present

## 2016-08-13 DIAGNOSIS — Z886 Allergy status to analgesic agent status: Secondary | ICD-10-CM | POA: Diagnosis not present

## 2016-08-13 DIAGNOSIS — R7989 Other specified abnormal findings of blood chemistry: Secondary | ICD-10-CM

## 2016-08-13 DIAGNOSIS — D631 Anemia in chronic kidney disease: Secondary | ICD-10-CM | POA: Diagnosis present

## 2016-08-13 DIAGNOSIS — I428 Other cardiomyopathies: Secondary | ICD-10-CM | POA: Diagnosis not present

## 2016-08-13 DIAGNOSIS — R609 Edema, unspecified: Secondary | ICD-10-CM | POA: Diagnosis not present

## 2016-08-13 DIAGNOSIS — S329XXS Fracture of unspecified parts of lumbosacral spine and pelvis, sequela: Secondary | ICD-10-CM | POA: Diagnosis not present

## 2016-08-13 DIAGNOSIS — I132 Hypertensive heart and chronic kidney disease with heart failure and with stage 5 chronic kidney disease, or end stage renal disease: Secondary | ICD-10-CM | POA: Diagnosis present

## 2016-08-13 DIAGNOSIS — S22000A Wedge compression fracture of unspecified thoracic vertebra, initial encounter for closed fracture: Secondary | ICD-10-CM | POA: Diagnosis not present

## 2016-08-13 DIAGNOSIS — I319 Disease of pericardium, unspecified: Secondary | ICD-10-CM | POA: Diagnosis present

## 2016-08-13 DIAGNOSIS — Z940 Kidney transplant status: Secondary | ICD-10-CM | POA: Diagnosis not present

## 2016-08-13 DIAGNOSIS — Z7901 Long term (current) use of anticoagulants: Secondary | ICD-10-CM | POA: Diagnosis not present

## 2016-08-13 DIAGNOSIS — K625 Hemorrhage of anus and rectum: Secondary | ICD-10-CM | POA: Diagnosis not present

## 2016-08-13 DIAGNOSIS — I313 Pericardial effusion (noninflammatory): Secondary | ICD-10-CM | POA: Diagnosis not present

## 2016-08-13 DIAGNOSIS — F418 Other specified anxiety disorders: Secondary | ICD-10-CM | POA: Diagnosis not present

## 2016-08-13 DIAGNOSIS — I255 Ischemic cardiomyopathy: Secondary | ICD-10-CM | POA: Diagnosis present

## 2016-08-13 DIAGNOSIS — N189 Chronic kidney disease, unspecified: Secondary | ICD-10-CM | POA: Diagnosis not present

## 2016-08-13 DIAGNOSIS — R748 Abnormal levels of other serum enzymes: Secondary | ICD-10-CM | POA: Diagnosis present

## 2016-08-13 DIAGNOSIS — W19XXXD Unspecified fall, subsequent encounter: Secondary | ICD-10-CM | POA: Diagnosis present

## 2016-08-13 DIAGNOSIS — Z95810 Presence of automatic (implantable) cardiac defibrillator: Secondary | ICD-10-CM | POA: Diagnosis not present

## 2016-08-13 DIAGNOSIS — D638 Anemia in other chronic diseases classified elsewhere: Secondary | ICD-10-CM | POA: Diagnosis not present

## 2016-08-13 DIAGNOSIS — Z9119 Patient's noncompliance with other medical treatment and regimen: Secondary | ICD-10-CM | POA: Diagnosis not present

## 2016-08-13 DIAGNOSIS — I5022 Chronic systolic (congestive) heart failure: Secondary | ICD-10-CM

## 2016-08-13 DIAGNOSIS — M8080XS Other osteoporosis with current pathological fracture, unspecified site, sequela: Secondary | ICD-10-CM | POA: Diagnosis not present

## 2016-08-13 DIAGNOSIS — Z87891 Personal history of nicotine dependence: Secondary | ICD-10-CM | POA: Diagnosis not present

## 2016-08-13 DIAGNOSIS — S22000S Wedge compression fracture of unspecified thoracic vertebra, sequela: Secondary | ICD-10-CM | POA: Diagnosis not present

## 2016-08-13 DIAGNOSIS — D62 Acute posthemorrhagic anemia: Secondary | ICD-10-CM | POA: Diagnosis not present

## 2016-08-13 DIAGNOSIS — W19XXXA Unspecified fall, initial encounter: Secondary | ICD-10-CM | POA: Diagnosis present

## 2016-08-13 DIAGNOSIS — Z79899 Other long term (current) drug therapy: Secondary | ICD-10-CM | POA: Diagnosis not present

## 2016-08-13 DIAGNOSIS — F419 Anxiety disorder, unspecified: Secondary | ICD-10-CM | POA: Diagnosis present

## 2016-08-13 DIAGNOSIS — Q6119 Other polycystic kidney, infantile type: Secondary | ICD-10-CM | POA: Diagnosis not present

## 2016-08-13 DIAGNOSIS — M546 Pain in thoracic spine: Secondary | ICD-10-CM | POA: Diagnosis not present

## 2016-08-13 DIAGNOSIS — I5023 Acute on chronic systolic (congestive) heart failure: Secondary | ICD-10-CM | POA: Diagnosis not present

## 2016-08-13 DIAGNOSIS — S22000D Wedge compression fracture of unspecified thoracic vertebra, subsequent encounter for fracture with routine healing: Secondary | ICD-10-CM | POA: Diagnosis not present

## 2016-08-13 DIAGNOSIS — I32 Pericarditis in diseases classified elsewhere: Secondary | ICD-10-CM | POA: Diagnosis present

## 2016-08-13 DIAGNOSIS — N2581 Secondary hyperparathyroidism of renal origin: Secondary | ICD-10-CM | POA: Diagnosis present

## 2016-08-13 DIAGNOSIS — S22069A Unspecified fracture of T7-T8 vertebra, initial encounter for closed fracture: Secondary | ICD-10-CM | POA: Diagnosis present

## 2016-08-13 DIAGNOSIS — N186 End stage renal disease: Secondary | ICD-10-CM | POA: Diagnosis not present

## 2016-08-13 DIAGNOSIS — Z9115 Patient's noncompliance with renal dialysis: Secondary | ICD-10-CM | POA: Diagnosis not present

## 2016-08-13 DIAGNOSIS — I82501 Chronic embolism and thrombosis of unspecified deep veins of right lower extremity: Secondary | ICD-10-CM | POA: Diagnosis present

## 2016-08-13 DIAGNOSIS — K219 Gastro-esophageal reflux disease without esophagitis: Secondary | ICD-10-CM | POA: Diagnosis present

## 2016-08-13 DIAGNOSIS — L97229 Non-pressure chronic ulcer of left calf with unspecified severity: Secondary | ICD-10-CM | POA: Diagnosis present

## 2016-08-13 DIAGNOSIS — D571 Sickle-cell disease without crisis: Secondary | ICD-10-CM | POA: Diagnosis present

## 2016-08-13 DIAGNOSIS — E875 Hyperkalemia: Secondary | ICD-10-CM | POA: Diagnosis present

## 2016-08-13 LAB — CBC
HCT: 32.1 % — ABNORMAL LOW (ref 36.0–46.0)
Hemoglobin: 10.1 g/dL — ABNORMAL LOW (ref 12.0–15.0)
MCH: 27.4 pg (ref 26.0–34.0)
MCHC: 31.5 g/dL (ref 30.0–36.0)
MCV: 87.2 fL (ref 78.0–100.0)
PLATELETS: 256 10*3/uL (ref 150–400)
RBC: 3.68 MIL/uL — ABNORMAL LOW (ref 3.87–5.11)
RDW: 19.8 % — AB (ref 11.5–15.5)
WBC: 7.4 10*3/uL (ref 4.0–10.5)

## 2016-08-13 LAB — BASIC METABOLIC PANEL
Anion gap: 14 (ref 5–15)
BUN: 89 mg/dL — AB (ref 6–20)
CO2: 20 mmol/L — ABNORMAL LOW (ref 22–32)
CREATININE: 11.08 mg/dL — AB (ref 0.44–1.00)
Calcium: 9.2 mg/dL (ref 8.9–10.3)
Chloride: 104 mmol/L (ref 101–111)
GFR calc Af Amer: 5 mL/min — ABNORMAL LOW (ref >60)
GFR, EST NON AFRICAN AMERICAN: 4 mL/min — AB (ref >60)
Glucose, Bld: 100 mg/dL — ABNORMAL HIGH (ref 65–99)
Potassium: 5.4 mmol/L — ABNORMAL HIGH (ref 3.5–5.1)
SODIUM: 138 mmol/L (ref 135–145)

## 2016-08-13 LAB — TROPONIN I
TROPONIN I: 0.27 ng/mL — AB (ref <0.03)
TROPONIN I: 0.22 ng/mL — AB (ref <0.03)
Troponin I: 0.25 ng/mL (ref <0.03)

## 2016-08-13 MED ORDER — DOXERCALCIFEROL 4 MCG/2ML IV SOLN: 2.0000 ug | INTRAVENOUS | Status: DC

## 2016-08-13 MED ORDER — FAMOTIDINE 20 MG PO TABS
20.0000 mg | ORAL_TABLET | Freq: Two times a day (BID) | ORAL | Status: DC | PRN
  Administered 2016-08-16 – 2016-08-17 (×2): 20 mg via ORAL
  Filled 2016-08-13 (×2): qty 1

## 2016-08-13 MED ORDER — HYDROMORPHONE HCL 2 MG/ML IJ SOLN
1.0000 mg | Freq: Once | INTRAMUSCULAR | Status: AC
  Administered 2016-08-13: 1 mg via INTRAVENOUS
  Filled 2016-08-13: qty 1

## 2016-08-13 MED ORDER — SEVELAMER CARBONATE 800 MG PO TABS
2400.0000 mg | ORAL_TABLET | Freq: Three times a day (TID) | ORAL | Status: DC
  Administered 2016-08-13 – 2016-08-18 (×12): 2400 mg via ORAL
  Filled 2016-08-13 (×12): qty 3

## 2016-08-13 MED ORDER — METHOCARBAMOL 1000 MG/10ML IJ SOLN
500.0000 mg | Freq: Three times a day (TID) | INTRAVENOUS | Status: DC | PRN
  Administered 2016-08-13 (×2): 500 mg via INTRAVENOUS
  Filled 2016-08-13 (×6): qty 5

## 2016-08-13 MED ORDER — SODIUM CHLORIDE 0.9% FLUSH: 3.0000 mL | INTRAVENOUS | Status: DC | PRN

## 2016-08-13 MED ORDER — NAPROXEN 250 MG PO TABS
500.0000 mg | ORAL_TABLET | Freq: Two times a day (BID) | ORAL | Status: DC
  Administered 2016-08-13 – 2016-08-18 (×9): 500 mg via ORAL
  Filled 2016-08-13 (×9): qty 2

## 2016-08-13 MED ORDER — SODIUM CHLORIDE 0.9 % IV SOLN: 250.0000 mL | INTRAVENOUS | Status: DC | PRN

## 2016-08-13 MED ORDER — CINACALCET HCL 30 MG PO TABS
30.0000 mg | ORAL_TABLET | Freq: Two times a day (BID) | ORAL | Status: DC
  Administered 2016-08-13 (×2): 30 mg via ORAL
  Filled 2016-08-13 (×3): qty 1

## 2016-08-13 MED ORDER — RENA-VITE PO TABS
1.0000 | ORAL_TABLET | Freq: Every day | ORAL | Status: DC
  Administered 2016-08-13 – 2016-08-18 (×6): 1 via ORAL
  Filled 2016-08-13 (×5): qty 1

## 2016-08-13 MED ORDER — OXYCODONE HCL 5 MG PO TABS
15.0000 mg | ORAL_TABLET | Freq: Four times a day (QID) | ORAL | Status: DC | PRN
  Administered 2016-08-13 – 2016-08-18 (×8): 15 mg via ORAL
  Filled 2016-08-13 (×11): qty 3

## 2016-08-13 MED ORDER — SODIUM CHLORIDE 0.9% FLUSH
3.0000 mL | Freq: Two times a day (BID) | INTRAVENOUS | Status: DC
  Administered 2016-08-13 – 2016-08-18 (×5): 3 mL via INTRAVENOUS

## 2016-08-13 MED ORDER — SERTRALINE HCL 100 MG PO TABS
100.0000 mg | ORAL_TABLET | Freq: Every day | ORAL | Status: DC
  Administered 2016-08-13 – 2016-08-18 (×6): 100 mg via ORAL
  Filled 2016-08-13 (×6): qty 1

## 2016-08-13 MED ORDER — SODIUM CHLORIDE 0.9% FLUSH
3.0000 mL | Freq: Two times a day (BID) | INTRAVENOUS | Status: DC
  Administered 2016-08-13 – 2016-08-18 (×6): 3 mL via INTRAVENOUS

## 2016-08-13 MED ORDER — MORPHINE SULFATE (PF) 4 MG/ML IV SOLN
4.0000 mg | INTRAVENOUS | Status: DC | PRN
  Administered 2016-08-13: 4 mg via INTRAVENOUS
  Filled 2016-08-13: qty 1

## 2016-08-13 MED ORDER — ONDANSETRON HCL 4 MG/2ML IJ SOLN: 4.0000 mg | Freq: Four times a day (QID) | INTRAMUSCULAR | Status: DC | PRN

## 2016-08-13 MED ORDER — CALCITRIOL 0.5 MCG PO CAPS: 0.5000 ug | ORAL_CAPSULE | ORAL | Status: DC

## 2016-08-13 MED ORDER — CALCITONIN (SALMON) 200 UNIT/ACT NA SOLN
1.0000 | Freq: Every day | NASAL | Status: DC
  Filled 2016-08-13: qty 3.7

## 2016-08-13 MED ORDER — ONDANSETRON HCL 4 MG PO TABS: 4.0000 mg | ORAL_TABLET | Freq: Four times a day (QID) | ORAL | Status: DC | PRN

## 2016-08-13 NOTE — Progress Notes (Signed)
Patient seen and examined. Admitted after midnight secondary to CP and SOB. Found to have pulmonary edema and acute on chronic systolic heart failure with demand ischemia. Patient no compliant with HD for over a week prior to admission. Please refer to H&P written by Dr. Onalee Hua for further info/details on admission.  Plan: -will need consecutive HD treatment to adjust volume and assist with extra fluid built up. -continue PRN analgesics and supportive care -appreciate cardiology and renal assistance -discussed with patient importance of compliance.  [name] Small [phone]

## 2016-08-13 NOTE — Consult Note (Signed)
[facility] Renal Consultation Note  Indication for Consultation:  Management of ESRD/hemodialysis; anemia, hypertension/volume and secondary hyperparathyroidism  HPI: [name] Small is a 25 y.o. 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 Found to have T7, T8, T12 vertebral compression fractures. Ortho has decided to treat conservatively (no plan for kyphoplasty at this time).  Now admitted with Volume overload / Back pain / having missed her last 3 op txs/ Pericardial effusion.  With her last admit she had hyperkalemia and was volume overloaded initially due to missing dialysis due to pain.  At her last admit  she was assigned a new OP kidney closer to Home(Lives in [place]. ) prior HD center =[place] (prior OP pt.  Of [facility]. In [place])  She was discharged last admit to [facility]  ( [place] team) . She was aware of need to change centers and address, Her last was HD was 08/04/16 at  [facility] = missed last 3 txs.  Currently in room with Mother with co some L calf blistering lesions new  since last admit "burning  type discomfort." Reporting some chest discomfort past 2 days severe back pain , Denies fevers, chills,or  Dizziness. We are consulted for ESRD / Dialysis issues. Also noted last admit attempted to place  A permanent HD access with new RUE AVG 08/01/16; required removal 10/12 due to steal symptoms.She was dialyzing with a L Fem Perm cath Dr. Lucky Cowboy had placed in [facility].      Past Medical History:  Diagnosis Date  . AICD (automatic cardioverter/defibrillator) present 12/16/14   [employer]  . Anemia   . Anxiety   . Cardiomyopathy   . Cellulitis and abscess of face 03/22/2013  . CHF (congestive heart failure) ([place])   .  Chronic anticoagulation   . Chronic pain   . Depression   . Dysrhythmia    at times per pt.  . 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 ESRD (end stage renal disease) on dialysis [facility])    "MWF; [place]" (04/11/2016)  . GERD (gastroesophageal reflux disease)   . H/O transfusion of packed red blood cells   . Hemodialysis patient ([place])    Mon. Wed. Fri  . Hyperkalemia 09/2015  . Hypertension   . Narcotic abuse, continuous   . Osteoporosis   . Pelvic fracture ([place]) 06/2015   "on the right"  . Polycystic kidney disease   . Pulmonary emboli (HCC) 01/2012   Bilateral, moderate clot burden, areas of pulmonary infarction and central necrosis  . Renal insufficiency   . Sickle cell anemia (HCC)     Past Surgical History:  Procedure Laterality Date  . AV FISTULA PLACEMENT Bilateral    "right side stopped working & never had it revised" (04/11/2016)  . AV FISTULA PLACEMENT Right 08/01/2016   Procedure: INSERTION OF ARTERIOVENOUS (AV) GORE-TEX GRAFT RIGHT ARM;  Surgeon: Angelia Mould, MD;  Location: [place];  Service: Vascular;  Laterality: Right;  . [place] REMOVAL Right 08/03/2016   Procedure: REMOVAL OF ARTERIOVENOUS GORETEX GRAFT ([place]);  Surgeon: Waynetta Sandy, MD;  Location: [place];  Service: Vascular;  Laterality: Right;  . CARDIAC CATHETERIZATION    . IMPLANTABLE  CARDIOVERTER DEFIBRILLATOR IMPLANT Right 12/2014  . INCISION AND DRAINAGE ABSCESS Right 03/21/2013   Procedure: INCISION AND DRAINAGE RIGHT CHEEK ABSCESS REMOVAL OF FOREIGN BODY;  Surgeon: Izora Gala, MD;  Location: [place];  Service: ENT;  Laterality: Right;  . INSERTION OF DIALYSIS CATHETER Right Mar 09, 2016   thigh, Dr. Lucky Cowboy, [facility]  . 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 NEPHRECTOMY Right    third kidney placed in 2008, and body rejected in 2012   . PERIPHERAL VASCULAR CATHETERIZATION Left  03/09/2016   Procedure: A/V Shuntogram/Fistulagram;  Surgeon: Algernon Huxley, MD;  Location: [place] CV LAB;  Service: Cardiovascular;  Laterality: Left;  . PERIPHERAL VASCULAR CATHETERIZATION N/A 05/09/2016   Procedure: Dialysis/Perma Catheter Removal;  Surgeon: Katha Cabal, MD;  Location: [place] CV LAB;  Service: Cardiovascular;  Laterality: N/A;  . PERIPHERAL VASCULAR CATHETERIZATION N/A 07/12/2016   Procedure: Dialysis/Perma Catheter Insertion;  Surgeon: Algernon Huxley, MD;  Location: [place] CV LAB;  Service: Cardiovascular;  Laterality: N/A;  . PERIPHERAL VASCULAR CATHETERIZATION Right 07/31/2016   Procedure: Upper Extremity Venography;  Surgeon: Waynetta Sandy, MD;  Location: [place] CV LAB;  Service: Cardiovascular;  Laterality: Right;  . REVISON OF ARTERIOVENOUS FISTULA Left 03/22/2016   Procedure: REVISON OF ARTERIOVENOUS FISTULA ( ARTEGRAFT );  Surgeon: Algernon Huxley, MD;  Location: [facility];  Service: Vascular;  Laterality: Left;  . TONSILLECTOMY AND ADENOIDECTOMY  ~ 2000      Family History  Problem Relation Age of Onset  . Polycystic kidney disease Father   . Hypertension Father       reports that she quit smoking about 8 months ago. Her smoking use included Cigarettes. She smoked 0.00 packs per day for 1.00 year. She has never used smokeless tobacco. She reports that she does not drink alcohol or use drugs.   Allergies  Allergen Reactions  . Aspirin Other (See Comments)    Interacts with Coreg  . Morphine And Related Itching and Other (See Comments)    Ok with oxycodone   . Tramadol Anaphylaxis  . Vicodin [Hydrocodone-Acetaminophen] Hives  . Buprenorphine Hcl Itching and Other (See Comments)    Ok with oxycodone  . Iohexol Itching    Prior to Admission medications   Medication Sig Start Date End Date Taking? Authorizing Provider  calcitonin, salmon, (MIACALCIN) 200 UNIT/ACT nasal spray Place 1 spray into alternate nostrils daily. 08/02/16   Yes Theodis Blaze, MD  calcitRIOL (ROCALTROL) 0.5 MCG capsule Take 1 capsule (0.5 mcg total) by mouth every Monday, Wednesday, and Friday with hemodialysis. 08/02/16  Yes Theodis Blaze, MD  cinacalcet (SENSIPAR) 30 MG tablet Take 1 tablet (30 mg total) by mouth 2 (two) times daily with a meal. 08/02/16  Yes Theodis Blaze, MD  colchicine 0.6 MG tablet Take 0.5 tablet (0.3 mg total) by mouth two times per week. Patient taking differently: Take 0.6 mg by mouth 2 (two) times a week.  07/04/16  Yes Will Meredith Leeds, MD  famotidine (PEPCID) 20 MG tablet Take 1 tablet (20 mg total) by mouth 2 (two) times daily as needed for heartburn or indigestion. 08/02/16  Yes Theodis Blaze, MD  LORazepam (ATIVAN) 1 MG tablet Take 1 tablet (1 mg total) by mouth daily as needed for anxiety. Patient taking differently: Take 1 mg by mouth every morning.  07/20/16  Yes Tresa Garter, MD  multivitamin (RENA-VIT) TABS tablet Take 1 tablet by mouth daily. 01/04/16  Yes  Tresa Garter, MD  naproxen (NAPROSYN) 500 MG tablet Take 1 tablet (500 mg total) by mouth 2 (two) times daily with a meal. 08/02/16  Yes Theodis Blaze, MD  oxyCODONE (ROXICODONE) 15 MG immediate release tablet Take 1 tablet (15 mg total) by mouth every 6 (six) hours as needed. You must try to cut back on the frequency of these pills as your pain improves 08/02/16  Yes Theodis Blaze, MD  sertraline (ZOLOFT) 100 MG tablet Take 1 tablet (100 mg total) by mouth daily. 03/13/16  Yes Tresa Garter, MD  sevelamer carbonate (RENVELA) 800 MG tablet Take 2-3 tablets (1,600-2,400 mg total) by mouth 3 (three) times daily with meals. 3 tabs with meals, 2 tabs with snacks Patient taking differently: Take 1,600-2,400 mg by mouth See admin instructions. 2,400 mg three times a day with each meal and 1,600 mg with snacks 01/04/16 01/03/17 Yes Tresa Garter, MD     Anti-infectives    None      Results for orders placed or performed during the hospital  encounter of 08/12/16 (from the past 48 hour(s))  Basic metabolic panel     Status: Abnormal   Collection Time: 08/12/16  9:22 PM  Result Value Ref Range   Sodium 140 135 - 145 mmol/L   Potassium 5.3 (H) 3.5 - 5.1 mmol/L   Chloride 103 101 - 111 mmol/L   CO2 24 22 - 32 mmol/L   Glucose, Bld 72 65 - 99 mg/dL   BUN 84 (H) 6 - 20 mg/dL   Creatinine, Ser 10.98 (H) 0.44 - 1.00 mg/dL   Calcium 8.9 8.9 - 10.3 mg/dL   GFR calc non Af Amer 4 (L) >60 mL/min   GFR calc Af Amer 5 (L) >60 mL/min    Comment: (NOTE) The eGFR has been calculated using the CKD EPI equation. This calculation has not been validated in all clinical situations. eGFR's persistently <60 mL/min signify possible Chronic Kidney Disease.    Anion gap 13 5 - 15  CBC     Status: Abnormal   Collection Time: 08/12/16  9:22 PM  Result Value Ref Range   WBC 8.4 4.0 - 10.5 K/uL   RBC 3.84 (L) 3.87 - 5.11 MIL/uL   Hemoglobin 10.6 (L) 12.0 - 15.0 g/dL   HCT 33.2 (L) 36.0 - 46.0 %   MCV 86.5 78.0 - 100.0 fL   MCH 27.6 26.0 - 34.0 pg   MCHC 31.9 30.0 - 36.0 g/dL   RDW 19.5 (H) 11.5 - 15.5 %   Platelets 276 150 - 400 K/uL  I-stat troponin, ED     Status: Abnormal   Collection Time: 08/12/16  9:32 PM  Result Value Ref Range   Troponin i, poc 0.23 (HH) 0.00 - 0.08 ng/mL   Comment NOTIFIED PHYSICIAN    Comment 3            Comment: Due to the release kinetics of cTnI, a negative result within the first hours of the onset of symptoms does not rule out myocardial infarction with certainty. If myocardial infarction is still suspected, repeat the test at appropriate intervals.   Troponin I     Status: Abnormal   Collection Time: 08/13/16  2:20 AM  Result Value Ref Range   Troponin I 0.27 (HH) <0.03 ng/mL    Comment: CRITICAL RESULT CALLED TO, READ BACK BY AND VERIFIED WITH: KOME,J RN 08/13/2016 0334 JORDANS   Troponin I     Status: Abnormal  Collection Time: 08/13/16  8:40 AM  Result Value Ref Range   Troponin I 0.25 (HH)  <0.03 ng/mL    Comment: CRITICAL VALUE NOTED.  VALUE IS CONSISTENT WITH PREVIOUSLY REPORTED AND CALLED VALUE.  Basic metabolic panel     Status: Abnormal   Collection Time: 08/13/16  8:40 AM  Result Value Ref Range   Sodium 138 135 - 145 mmol/L   Potassium 5.4 (H) 3.5 - 5.1 mmol/L   Chloride 104 101 - 111 mmol/L   CO2 20 (L) 22 - 32 mmol/L   Glucose, Bld 100 (H) 65 - 99 mg/dL   BUN 89 (H) 6 - 20 mg/dL   Creatinine, Ser 11.08 (H) 0.44 - 1.00 mg/dL   Calcium 9.2 8.9 - 10.3 mg/dL   GFR calc non Af Amer 4 (L) >60 mL/min   GFR calc Af Amer 5 (L) >60 mL/min    Comment: (NOTE) The eGFR has been calculated using the CKD EPI equation. This calculation has not been validated in all clinical situations. eGFR's persistently <60 mL/min signify possible Chronic Kidney Disease.    Anion gap 14 5 - 15  CBC     Status: Abnormal   Collection Time: 08/13/16  8:40 AM  Result Value Ref Range   WBC 7.4 4.0 - 10.5 K/uL   RBC 3.68 (L) 3.87 - 5.11 MIL/uL   Hemoglobin 10.1 (L) 12.0 - 15.0 g/dL   HCT 32.1 (L) 36.0 - 46.0 %   MCV 87.2 78.0 - 100.0 fL   MCH 27.4 26.0 - 34.0 pg   MCHC 31.5 30.0 - 36.0 g/dL   RDW 19.8 (H) 11.5 - 15.5 %   Platelets 256 150 - 400 K/uL    ROS: see HPI   Physical Exam: Vitals:   08/13/16 0152 08/13/16 0542  BP: (!) 136/92 (!) 135/100  Pulse: 100 (!) 104  Resp: 20 18  Temp: 98.2 F (36.8 C) 98.1 F (36.7 C)     General: Young  AAF alert, chronically ill appearing , Not in distress . OX3/ has back/thoracic/  Spine brace on  HEENT: [place] , eomi , MMM, nonicteric  Neck: Pos jvd  Heart: RRR  2/6 sem  lsb / ?rub   Lungs: Faint Right  basilar rales/ nonlabored breathing  Abdomen: BS +, nontender, some abd wall fluid edema Extremities: 2-3+ bipedal edema to knees Skin: L mid calf dark blistering type lesion , tender Neuro: Alert Ox3 , moves all extrem. to request Dialysis Access: L Fem. Perm cath dressing in place nontender dry , no dc noted / R upper arm area of recent  [place] ligation healing /  Site of LUA AVF clotted  no bruit or thrill non tender   Dialysis Orders: Center: [place] [facility] on MWF . EDW 51kg  HD Bath 2k, 2ca  Time 4hr Heparin 2000. Access L fem perm cath      Hec 1 mcg IV/HD no esa or fe    New RUE AVG 08/01/16; removal 10/12 due to steal symptoms. L Fem Perm cath Dr. Lucky Cowboy placed   Assessment/Plan  1. Volume overload/ anasarca/-Pericardial effusion from missed HD Txs - HD for volume removal not in Distress currently / 100% O2 say rm air /HD in am and will require serial hd for vol uf / avoid large bp drop with severe CM (EF 12%) 2. ESRD -  OP HD = MWF  [facility] center  3. Hypertension/volume  - no wt currently last  edw thought  to be 51 kg at dc / mild htn ,attempt uf in AM HD /need to avoid much bp meds  For vol removal 4. Anemia  Of ESRD=  hgb 10.1 no prior ESA , fu hgb s 5. Metabolic bone disease severe with PTH >4000  -  Renvela binders  / iv hectorol 9mg q hd (IV vit d at High[facility] unit used ) sensipar 319mbid  Start/ needs parathyroidectomy  = last admit Per Dr. 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 uf  6. Non-ischemic cardiomyopathy (s/p AICD, EF 12-20%): On low dose coreg last admit / on hold now /Card. eval . 7. HD access issue- ?VVS  eval  For ?? Fem avgg when stable 8. T7, T8, T12 vertebral compression fractures-  Pain per admit / has back brace  9. Noncompliance  Issues= dw with Pt and Mom in room need for compliance AGAIN  with OP HD txs   DaErnest HaberPA-C [id number]/22/2017, 1:00 PM   Pt seen, examined, agree w assess/plan as above with additions as indicated. ESRD pt w severe renal bone disease, recent fall w vertebral compression fx's is now in a back brace and minimally  mobile due to pain.  Was dc'd recently from hospital to home and has missed all 3 HD sessions at her new unit, probably due to immobility most likely.  She really will need temp SNF placement to get proper HD, she lives w a boyfriend who works full-time and is unlikely to be able to get to outpatient HD unit by herself in this condition. Spoke to her and she is agreeable. Plan serial HD to get vol down.   RoKelly SplinterD CaNewell Rubbermaidager [phone]  cell [phone]/22/2017, 7:58 PM

## 2016-08-13 NOTE — Consult Note (Signed)
Chief Complaint/Reason for Consult: elevated troponin, pericardial effusion    Requesting Physician: Derrill Kay   PCP:  Angelica Chessman, MD Primary Cardiologist:[place]  HPI:   25 year old female with likely nonischemic CM with sCHF, ESRD with noncompliance with dialysis presenting to the ED volume overloaded after missing dialysis for 1 week.  She notes that she was recently discharged and had difficulty arranging to get to dialysis.  Over this time period, she had developed worsening volume overload, orthopnea, edema, shortness of breath and chest pain.    Currently admitted to hospitalist with plans for AM dialysis.  In ED: 1 mg dilaudid given.  ED did bedside ultrasound and report a moderate effusion without signs of tamponade.  Previous TTEs with small effusion.  Notably, had recent SPECT that was high risk due to low EF but no large area of ischemia.    Currently getting dialysis via left fem line (had steal from recent R forearm AVF)  On interview, uncomfortable (wearing back brace from compression fracture), sitting upright w/ shortness of breath as main symptom.    Previous cardiac studies: SPECT 07/18/16  Nuclear stress EF: 12%.  There was no ST segment deviation noted during stress.  The left ventricular ejection fraction is severely decreased (<30%).  This is a high risk study.   1. EF 12% with diffuse hypokinesis.  2. Fixed small, mild basal inferior perfusion defect may be attenuation.  Reversible small, mild basal anterior defect may be a small area of ischemia.    High risk study due to low EF.  Probably nonischemic cardiomyopathy.   TTE 06/19/2016 Study Conclusions  - Left ventricle: The cavity size was mildly dilated. Wall   thickness was normal. Systolic function was severely reduced. The   estimated ejection fraction was in the range of 20% to 25%.   Diffuse hypokinesis. The study is not technically sufficient to   allow evaluation of LV  diastolic function. - Aortic valve: Sclerosis without stenosis. There was no   regurgitation. - Mitral valve: Mildly thickened leaflets . There was moderate   regurgitation. - Left atrium: Moderately dilated. - Right ventricle: The cavity size was moderately dilated. Pacer   wire or catheter noted in right ventricle. Decreased systolic   function. - Right atrium: The atrium was at the upper limits of normal in   size. Pacer wire or catheter noted in right atrium. - Atrial septum: A septal defect cannot be excluded. The IAS bows   from right to left, suggesting RA pressure > LA pressure. - Tricuspid valve: There was mild regurgitation. More than 35%   mitral inflow variation with respiration. - Pulmonary arteries: PA peak pressure: 28 mm Hg (S). - Systemic veins: The IVC measures <2.1 cm, but does not collapse   >50%, suggesting an elevated RA pressure of 8 mmHg. - Pericardium, extracardiac: Small pericardial effusion. Features   were not consistent with tamponade physiology.  Impressions:  - Compared to a prior study in 03/2016, there have been no   significant changes.   Past Medical History:  Diagnosis Date  . AICD (automatic cardioverter/defibrillator) present 12/16/14   [employer]  . Anemia   . Anxiety   . Cardiomyopathy   . Cellulitis and abscess of face 03/22/2013  . CHF (congestive heart failure) ([place])   . Chronic anticoagulation   . Chronic pain   . Depression   . Dysrhythmia    at times per pt.  . End stage renal disease ([place])  s/p cadaveric renal transplant 07/2007 and transplant failure 08/2011, then transplant nephrectomy 08/2011  . ESRD (end stage renal disease) on dialysis [facility])    "MWF; [place]" (04/11/2016)  . GERD (gastroesophageal reflux disease)   . H/O transfusion of packed red blood cells   . Hemodialysis patient ([place])    Mon. Wed. Fri  . Hyperkalemia 09/2015  . Hypertension   . Narcotic abuse, continuous   .  Osteoporosis   . Pelvic fracture ([place]) 06/2015   "on the right"  . Polycystic kidney disease   . Pulmonary emboli ([facility]) 01/2012   Bilateral, moderate clot burden, areas of pulmonary infarction and central necrosis  . Renal insufficiency   . Sickle cell anemia ([facility])     Past Surgical History:  Procedure Laterality Date  . AV FISTULA PLACEMENT Bilateral    "right side stopped working & never had it revised" (04/11/2016)  . AV FISTULA PLACEMENT Right 08/01/2016   Procedure: INSERTION OF ARTERIOVENOUS (AV) GORE-TEX GRAFT RIGHT ARM;  Surgeon: Angelia Mould, MD;  Location: [place];  Service: Vascular;  Laterality: Right;  . Ward REMOVAL Right 08/03/2016   Procedure: REMOVAL OF ARTERIOVENOUS GORETEX GRAFT ([place]);  Surgeon: Waynetta Sandy, MD;  Location: [place];  Service: Vascular;  Laterality: Right;  . CARDIAC CATHETERIZATION    . IMPLANTABLE CARDIOVERTER DEFIBRILLATOR IMPLANT Right 12/2014  . INCISION AND DRAINAGE ABSCESS Right 03/21/2013   Procedure: INCISION AND DRAINAGE RIGHT CHEEK ABSCESS REMOVAL OF FOREIGN BODY;  Surgeon: Izora Gala, MD;  Location: [place];  Service: ENT;  Laterality: Right;  . INSERTION OF DIALYSIS CATHETER Right Mar 09, 2016   thigh, Dr. Lucky Cowboy, [facility]  . 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 NEPHRECTOMY Right    third kidney placed in 2008, and body rejected in 2012   . PERIPHERAL VASCULAR CATHETERIZATION Left 03/09/2016   Procedure: A/V Shuntogram/Fistulagram;  Surgeon: Algernon Huxley, MD;  Location: [place] [facility];  Service: Cardiovascular;  Laterality: Left;  . PERIPHERAL VASCULAR CATHETERIZATION N/A 05/09/2016   Procedure: Dialysis/Perma Catheter Removal;  Surgeon: Katha Cabal, MD;  Location: [place] [facility];  Service: Cardiovascular;  Laterality: N/A;  . PERIPHERAL VASCULAR CATHETERIZATION N/A 07/12/2016   Procedure: Dialysis/Perma Catheter Insertion;  Surgeon: Algernon Huxley, MD;  Location: [place] [facility];  Service: Cardiovascular;   Laterality: N/A;  . PERIPHERAL VASCULAR CATHETERIZATION Right 07/31/2016   Procedure: Upper Extremity Venography;  Surgeon: Waynetta Sandy, MD;  Location: [place] [facility];  Service: Cardiovascular;  Laterality: Right;  . REVISON OF ARTERIOVENOUS FISTULA Left 03/22/2016   Procedure: REVISON OF ARTERIOVENOUS FISTULA ( ARTEGRAFT );  Surgeon: Algernon Huxley, MD;  Location: [facility];  Service: Vascular;  Laterality: Left;  . TONSILLECTOMY AND ADENOIDECTOMY  ~ 2000    Family History  Problem Relation Age of Onset  . Polycystic kidney disease Father   . Hypertension Father    Social History:  reports that she quit smoking about 8 months ago. Her smoking use included Cigarettes. She smoked 0.00 packs per day for 1.00 year. She has never used smokeless tobacco. She reports that she does not drink alcohol or use drugs.  Allergies:  Allergies  Allergen Reactions  . Aspirin Other (See Comments)    Interacts with Coreg  . Morphine And Related Itching and Other (See Comments)    Ok with oxycodone   . Tramadol Anaphylaxis  . Vicodin [Hydrocodone-Acetaminophen] Hives  . Buprenorphine Hcl Itching and Other (See  Comments)    Ok with oxycodone  . Iohexol Itching    No current facility-administered medications on file prior to encounter.    Current Outpatient Prescriptions on File Prior to Encounter  Medication Sig Dispense Refill  . calcitonin, salmon, (MIACALCIN) 200 UNIT/ACT nasal spray Place 1 spray into alternate nostrils daily. 3.7 mL 0  . calcitRIOL (ROCALTROL) 0.5 MCG capsule Take 1 capsule (0.5 mcg total) by mouth every Monday, Wednesday, and Friday with hemodialysis. 30 capsule 0  . cinacalcet (SENSIPAR) 30 MG tablet Take 1 tablet (30 mg total) by mouth 2 (two) times daily with a meal. 60 tablet 0  . colchicine 0.6 MG tablet Take 0.5 tablet (0.3 mg total) by mouth two times per week. (Patient taking differently: Take 0.6 mg by mouth 2 (two) times a week. ) 8 tablet 3  . famotidine  (PEPCID) 20 MG tablet Take 1 tablet (20 mg total) by mouth 2 (two) times daily as needed for heartburn or indigestion. 60 tablet 0  . LORazepam (ATIVAN) 1 MG tablet Take 1 tablet (1 mg total) by mouth daily as needed for anxiety. (Patient taking differently: Take 1 mg by mouth every morning. ) 30 tablet 0  . multivitamin (RENA-VIT) TABS tablet Take 1 tablet by mouth daily. 90 tablet 3  . naproxen (NAPROSYN) 500 MG tablet Take 1 tablet (500 mg total) by mouth 2 (two) times daily with a meal. 60 tablet 0  . oxyCODONE (ROXICODONE) 15 MG immediate release tablet Take 1 tablet (15 mg total) by mouth every 6 (six) hours as needed. You must try to cut back on the frequency of these pills as your pain improves 30 tablet 0  . sertraline (ZOLOFT) 100 MG tablet Take 1 tablet (100 mg total) by mouth daily. 30 tablet 3  . sevelamer carbonate (RENVELA) 800 MG tablet Take 2-3 tablets (1,600-2,400 mg total) by mouth 3 (three) times daily with meals. 3 tabs with meals, 2 tabs with snacks (Patient taking differently: Take 1,600-2,400 mg by mouth See admin instructions. 2,400 mg three times a day with each meal and 1,600 mg with snacks) 120 tablet 3    Results for orders placed or performed during the hospital encounter of 08/12/16 (from the past 48 hour(s))  Basic metabolic panel     Status: Abnormal   Collection Time: 08/12/16  9:22 PM  Result Value Ref Range   Sodium 140 135 - 145 mmol/L   Potassium 5.3 (H) 3.5 - 5.1 mmol/L   Chloride 103 101 - 111 mmol/L   CO2 24 22 - 32 mmol/L   Glucose, Bld 72 65 - 99 mg/dL   BUN 84 (H) 6 - 20 mg/dL   Creatinine, Ser 10.98 (H) 0.44 - 1.00 mg/dL   Calcium 8.9 8.9 - 10.3 mg/dL   GFR calc non Af Amer 4 (L) >60 mL/min   GFR calc Af Amer 5 (L) >60 mL/min    Comment: (NOTE) The eGFR has been calculated using the CKD EPI equation. This calculation has not been validated in all clinical situations. eGFR's persistently <60 mL/min signify possible Chronic Kidney Disease.     Anion gap 13 5 - 15  CBC     Status: Abnormal   Collection Time: 08/12/16  9:22 PM  Result Value Ref Range   WBC 8.4 4.0 - 10.5 K/uL   RBC 3.84 (L) 3.87 - 5.11 MIL/uL   Hemoglobin 10.6 (L) 12.0 - 15.0 g/dL   HCT 33.2 (L) 36.0 - 46.0 %  MCV 86.5 78.0 - 100.0 fL   MCH 27.6 26.0 - 34.0 pg   MCHC 31.9 30.0 - 36.0 g/dL   RDW 19.5 (H) 11.5 - 15.5 %   Platelets 276 150 - 400 K/uL  I-stat troponin, ED     Status: Abnormal   Collection Time: 08/12/16  9:32 PM  Result Value Ref Range   Troponin i, poc 0.23 (HH) 0.00 - 0.08 ng/mL   Comment NOTIFIED PHYSICIAN    Comment 3            Comment: Due to the release kinetics of cTnI, a negative result within the first hours of the onset of symptoms does not rule out myocardial infarction with certainty. If myocardial infarction is still suspected, repeat the test at appropriate intervals.    Dg Chest 2 View  Result Date: 08/12/2016 CLINICAL DATA:  Chest pain with dyspnea EXAM: CHEST  2 VIEW COMPARISON:  07/27/2016 thoracic spine CT, chest CT from 06/17/2016 FINDINGS: There is cardiomegaly with bibasilar atelectasis and right small pleural effusion. The aorta is not aneurysmal. Right-sided AICD device with right ventricular lead is again seen which appears intact. There is 50% compression of what may represent the T12 vertebral body when using the apex of a pre-existing IVC filter to determine levels based on the filter location on prior CT. The T7 compression fracture is partially obscured by the patient's chair vascular clips project over the visualized distal right arm and elbow. IMPRESSION: Stable cardiomegaly. Bibasilar atelectasis with small right pleural effusion. There appears to be a 50% compression fracture of what may represent T12, new since previous thoracic spine CT from 07/27/2016. Electronically Signed   By: Ashley Royalty M.D.   On: 08/12/2016 22:38    ECG/TELE: sinus tach 1st degree AVB with non specific ST changes laterally and poor R  wave progression, long QT  ROS: As above. Otherwise, review of systems is negative unless per above HPI  Vitals:   08/12/16 2300 08/12/16 2331 08/13/16 0000 08/13/16 0042  BP: (!) 135/101 113/100 119/83 (!) 137/125  Pulse: (!) 48 (!) 47    Resp: 15 19 (!) 29 19  Temp:      TempSrc:      SpO2: 100% 99%     Wt Readings from Last 10 Encounters:  08/04/16 51.2 kg (112 lb 14 oz)  07/23/16 57.2 kg (126 lb)  07/04/16 57.2 kg (126 lb)  06/19/16 56.2 kg (123 lb 14.4 oz)  06/06/16 57.6 kg (127 lb)  06/03/16 55 kg (121 lb 4.1 oz)  04/14/16 54.2 kg (119 lb 7.8 oz)  04/06/16 57.2 kg (126 lb)  04/01/16 64.3 kg (141 lb 11.2 oz)  03/22/16 54 kg (119 lb)    PE:  General: unwell, appears uncomfortable sitting up in bed with back brace on HEENT: Atraumatic, EOMI, mucous membranes moist CV: tachy rate with loud SEM throughout precordium, perhaps a rub, S4.  Respiratory: labored, tachypnea, crackles bibasilar ABD: differed with back brace Extremities: 2+ radial pulses bilaterally. Diffuse anisarca edema. Neuro/Psych: CN grossly intact, alert and oriented  Assessment/Plan Elevated troponin from decreased clearance (ESRD) and worsened after a week of no dialysis Chronic systolic CHF HTN Long QT Pericardial effusion-- likely 2/2 vol overload/ESRD  Recommendations: Elevated troponin from decreased clearance (ESRD) and worsened after a week of no dialysis; unlikely ACS - cycle troponin until down trending then stop - Daily ECG  Pericardial effusion-- likely 2/2 vol overload/ESRD - Obtain TTE to evaluate  Chronic systolic CHF - volume  removal via emergent dialysis - further CHF medication titration pending response to dialysis-- was previously on hydralazine, coreg but now off patient list and she is unsure which one she has decreased  HTN - As above, emergent dialysis  Long QT - Avoid QT prolonging medications, attention on future ECGs   Lolita Cram Means  MD 08/13/2016, 12:46  AM

## 2016-08-13 NOTE — Progress Notes (Signed)
Attempted to get report from ED 

## 2016-08-13 NOTE — H&P (Signed)
History and Physical    [name] Small ZOX:[id number] DOB: [birth year] DOA: 08/12/2016  PCP: Jeanann Lewandowsky, MD  Patient coming from:  home  Chief Complaint:  Missed dialysis  HPI: [name] Small is a 25 y.o. female with medical history significant of ESRD on dialysis due to childhood polycystic kidney disease, severe systolic EF 12%, AICD, noncompliance, PE comes in with missing dialysis this week.  Pt reports that her transportation was not set up at her discharge last week and that is why she missed her sessions all week.  Denies fevers.  C/o chest pain and back pain.  mainly her back pain.  She is still having back pain from her recent compression fractures.  Pt doing well on RA.  Referred for admission for need for dialysis  Review of Systems: As per HPI otherwise 10 point review of systems negative.   Past Medical History:  Diagnosis Date  . 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 Anemia   . Anxiety   . Cardiomyopathy   . Cellulitis and abscess of face 03/22/2013  . CHF (congestive heart failure) (HCC)   . Chronic anticoagulation   . Chronic pain   . Depression   . Dysrhythmia    at times per pt.  . 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 ESRD (end stage renal disease) on dialysis [facility])    "MWF; [facility]" (04/11/2016)  . GERD (gastroesophageal reflux disease)   . H/O transfusion of packed red blood cells   . Hemodialysis patient (HCC)    Mon. Wed. Fri  . Hyperkalemia 09/2015  . Hypertension   . Narcotic abuse, continuous   . Osteoporosis   . Pelvic fracture (HCC) 06/2015   "on the right"  . Polycystic kidney disease   . Pulmonary emboli (HCC) 01/2012   Bilateral, moderate clot burden, areas of pulmonary infarction and central necrosis  . Renal insufficiency   . Sickle cell anemia (HCC)     Past  Surgical History:  Procedure Laterality Date  . AV FISTULA PLACEMENT Bilateral    "right side stopped working & never had it revised" (04/11/2016)  . AV FISTULA PLACEMENT Right 08/01/2016   Procedure: INSERTION OF ARTERIOVENOUS (AV) GORE-TEX GRAFT RIGHT ARM;  Surgeon: Chuck Hint, MD;  Location: [facility] OR;  Service: Vascular;  Laterality: Right;  . AVGG REMOVAL Right 08/03/2016   Procedure: REMOVAL OF ARTERIOVENOUS GORETEX GRAFT (AVGG);  Surgeon: Maeola Harman, MD;  Location: [facility] OR;  Service: Vascular;  Laterality: Right;  . CARDIAC CATHETERIZATION    . IMPLANTABLE CARDIOVERTER DEFIBRILLATOR IMPLANT Right 12/2014  . INCISION AND DRAINAGE ABSCESS Right 03/21/2013   Procedure: INCISION AND DRAINAGE RIGHT CHEEK ABSCESS REMOVAL OF FOREIGN BODY;  Surgeon: Serena Colonel, MD;  Location: [facility] OR;  Service: ENT;  Laterality: Right;  . INSERTION OF DIALYSIS CATHETER Right Mar 09, 2016   thigh, Dr. Wyn Quaker, [facility]  . 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 NEPHRECTOMY Right    third kidney placed in 2008, and body rejected in 2012   . PERIPHERAL VASCULAR CATHETERIZATION Left 03/09/2016   Procedure: A/V Shuntogram/Fistulagram;  Surgeon: Annice Needy, MD;  Location: [facility] INVASIVE CV LAB;  Service: Cardiovascular;  Laterality: Left;  . PERIPHERAL VASCULAR CATHETERIZATION N/A 05/09/2016   Procedure: Dialysis/Perma Catheter Removal;  Surgeon: Renford Dills, MD;  Location: [facility] INVASIVE CV LAB;  Service: Cardiovascular;  Laterality: N/A;  . PERIPHERAL VASCULAR CATHETERIZATION N/A 07/12/2016   Procedure: Dialysis/Perma Catheter Insertion;  Surgeon: Annice NeedyJason S Dew, MD;  Location: [facility] INVASIVE CV LAB;  Service: Cardiovascular;  Laterality: N/A;  . PERIPHERAL VASCULAR CATHETERIZATION Right 07/31/2016   Procedure: Upper Extremity Venography;  Surgeon: Maeola HarmanBrandon Christopher Cain, MD;  [facility];  Service: Cardiovascular;  Laterality: Right;  . REVISON OF ARTERIOVENOUS FISTULA Left 03/22/2016    Procedure: REVISON OF ARTERIOVENOUS FISTULA ( ARTEGRAFT );  Surgeon: Annice NeedyJason S Dew, MD;  Location: [facility] ORS;  Service: Vascular;  Laterality: Left;  . TONSILLECTOMY AND ADENOIDECTOMY  ~ 2000     reports that she quit smoking about 8 months ago. Her smoking use included Cigarettes. She smoked 0.00 packs per day for 1.00 year. She has never used smokeless tobacco. She reports that she does not drink alcohol or use drugs.  Allergies  Allergen Reactions  . Aspirin Other (See Comments)    Interacts with Coreg  . Morphine And Related Itching and Other (See Comments)    Ok with oxycodone   . Tramadol Anaphylaxis  . Vicodin [Hydrocodone-Acetaminophen] Hives  . Buprenorphine Hcl Itching and Other (See Comments)    Ok with oxycodone  . Iohexol Itching    Family History  Problem Relation Age of Onset  . Polycystic kidney disease Father   . Hypertension Father     Prior to Admission medications   Medication Sig Start Date End Date Taking? Authorizing Provider  calcitonin, salmon, (MIACALCIN) 200 UNIT/ACT nasal spray Place 1 spray into alternate nostrils daily. 08/02/16  Yes Dorothea OgleIskra M Myers, MD  calcitRIOL (ROCALTROL) 0.5 MCG capsule Take 1 capsule (0.5 mcg total) by mouth every Monday, Wednesday, and Friday with hemodialysis. 08/02/16  Yes Dorothea OgleIskra M Myers, MD  cinacalcet (SENSIPAR) 30 MG tablet Take 1 tablet (30 mg total) by mouth 2 (two) times daily with a meal. 08/02/16  Yes Dorothea OgleIskra M Myers, MD  colchicine 0.6 MG tablet Take 0.5 tablet (0.3 mg total) by mouth two times per week. Patient taking differently: Take 0.6 mg by mouth 2 (two) times a week.  07/04/16  Yes Will Jorja LoaMartin Camnitz, MD  famotidine (PEPCID) 20 MG tablet Take 1 tablet (20 mg total) by mouth 2 (two) times daily as needed for heartburn or indigestion. 08/02/16  Yes Dorothea OgleIskra M Myers, MD  LORazepam (ATIVAN) 1 MG tablet Take 1 tablet (1 mg total) by mouth daily as needed for anxiety. Patient taking differently: Take 1 mg by mouth every  morning.  07/20/16  Yes Quentin Angstlugbemiga E Jegede, MD  multivitamin (RENA-VIT) TABS tablet Take 1 tablet by mouth daily. 01/04/16  Yes Quentin Angstlugbemiga E Jegede, MD  naproxen (NAPROSYN) 500 MG tablet Take 1 tablet (500 mg total) by mouth 2 (two) times daily with a meal. 08/02/16  Yes Dorothea OgleIskra M Myers, MD  oxyCODONE (ROXICODONE) 15 MG immediate release tablet Take 1 tablet (15 mg total) by mouth every 6 (six) hours as needed. You must try to cut back on the frequency of these pills as your pain improves 08/02/16  Yes Dorothea OgleIskra M Myers, MD  sertraline (ZOLOFT) 100 MG tablet Take 1 tablet (100 mg total) by mouth daily. 03/13/16  Yes Quentin Angstlugbemiga E Jegede, MD  sevelamer carbonate (RENVELA) 800 MG tablet Take 2-3 tablets (1,600-2,400 mg total) by mouth 3 (three) times daily with meals. 3 tabs with meals, 2 tabs with snacks Patient taking differently: Take 1,600-2,400 mg by mouth See admin instructions. 2,400 mg three times a day with each  meal and 1,600 mg with snacks 01/04/16 01/03/17 Yes Quentin Angst, MD    Physical Exam: Vitals:   08/12/16 2225 08/12/16 2230 08/12/16 2300 08/12/16 2331  BP:  131/98 (!) 135/101 113/100  Pulse:  101 (!) 48 (!) 47  Resp:  16 15 19   Temp:      TempSrc:      SpO2: 99% 100% 100% 99%    Constitutional: NAD, calm, comfortable Vitals:   08/12/16 2225 08/12/16 2230 08/12/16 2300 08/12/16 2331  BP:  131/98 (!) 135/101 113/100  Pulse:  101 (!) 48 (!) 47  Resp:  16 15 19   Temp:      TempSrc:      SpO2: 99% 100% 100% 99%   Eyes: PERRL, lids and conjunctivae normal ENMT: Mucous membranes are moist. Posterior pharynx clear of any exudate or lesions.Normal dentition.  Neck: normal, supple, no masses, no thyromegaly Respiratory: clear to auscultation bilaterally, no wheezing, no crackles. Normal respiratory effort. No accessory muscle use.  Cardiovascular: Regular rate and rhythm, no murmurs / rubs / gallops. Diffuse swelling. 2+ pedal pulses. No carotid bruits.  Abdomen: no tenderness,  no masses palpated. No hepatosplenomegaly. Bowel sounds positive.  Musculoskeletal: no clubbing / cyanosis. No joint deformity upper and lower extremities. Good ROM, no contractures. Normal muscle tone.  Skin: no rashes, lesions, ulcers. No induration Neurologic: CN 2-12 grossly intact. Sensation intact, DTR normal. Strength 5/5 in all 4.  Psychiatric: Normal judgment and insight. Alert and oriented x 3. Normal mood.    Labs on Admission: I have personally reviewed following labs and imaging studies  CBC:  Recent Labs Lab 08/12/16 2122  WBC 8.4  HGB 10.6*  HCT 33.2*  MCV 86.5  PLT 276   Basic Metabolic Panel:  Recent Labs Lab 08/12/16 2122  NA 140  K 5.3*  CL 103  CO2 24  GLUCOSE 72  BUN 84*  CREATININE 10.98*  CALCIUM 8.9   GFR: Estimated Creatinine Clearance: 6.3 mL/min (by C-G formula based on SCr of 10.98 mg/dL (H)).  Urine analysis:    Component Value Date/Time   COLORURINE YELLOW 01/14/2013 1639   APPEARANCEUR CLOUDY (A) 01/14/2013 1639   LABSPEC 1.008 01/14/2013 1639   PHURINE 8.0 01/14/2013 1639   GLUCOSEU NEGATIVE 01/14/2013 1639   HGBUR SMALL (A) 01/14/2013 1639   BILIRUBINUR NEGATIVE 01/14/2013 1639   KETONESUR NEGATIVE 01/14/2013 1639   PROTEINUR 30 (A) 01/14/2013 1639   UROBILINOGEN 0.2 01/14/2013 1639   NITRITE NEGATIVE 01/14/2013 1639   LEUKOCYTESUR MODERATE (A) 01/14/2013 1639     Radiological Exams on Admission: Dg Chest 2 View  Result Date: 08/12/2016 CLINICAL DATA:  Chest pain with dyspnea EXAM: CHEST  2 VIEW COMPARISON:  07/27/2016 thoracic spine CT, chest CT from 06/17/2016 FINDINGS: There is cardiomegaly with bibasilar atelectasis and right small pleural effusion. The aorta is not aneurysmal. Right-sided AICD device with right ventricular lead is again seen which appears intact. There is 50% compression of what may represent the T12 vertebral body when using the apex of a pre-existing IVC filter to determine levels based on the filter  location on prior CT. The T7 compression fracture is partially obscured by the patient's chair vascular clips project over the visualized distal right arm and elbow. IMPRESSION: Stable cardiomegaly. Bibasilar atelectasis with small right pleural effusion. There appears to be a 50% compression fracture of what may represent T12, new since previous thoracic spine CT from 07/27/2016. Electronically Signed   By: Rene Kocher.D.  On: 08/12/2016 22:38    Assessment/Plan 25 yo female with ESRD and many complications comes in with chest pain and back pain  Principal Problem:   Chest pain- serial trop.  Chronically elevated trop.  ahs h/o pericardial effusion, obtain cards consult.    Active Problems:   ESRD (end stage renal disease) on dialysis (HCC)- nephro called, for dialysis in the am   Chronically Elevated troponin   AICD (automatic cardioverter/defibrillator) present   NICM (nonischemic cardiomyopathy) (HCC)   Pericardial effusion   Noncompliance of patient with renal dialysis (HCC)   Will need to set up transportation for dialysis sessions I have had a frank discussion with patient that her noncompliance is detrimental to her overall health and will expediate her death, she understands.  DVT prophylaxis:  scds Code Status:   full Family Communication: none Disposition Plan:  Per day team Consults called:  nephro and cardiology Admission status:  observation   Everton Bertha A MD [facility]  If 7PM-7AM, please contact night-coverage [email or web address] Password [email or web address]  08/13/2016, 12:13 AM

## 2016-08-13 NOTE — Progress Notes (Addendum)
Patient Name: [name] Small Date of Encounter: 08/13/2016  Primary Cardiologist: [facility] Problem List     Principal Problem:   Chest pain Active Problems:   ESRD (end stage renal disease) on dialysis (HCC)   Chronically Elevated troponin   AICD (automatic cardioverter/defibrillator) present   NICM (nonischemic cardiomyopathy) (HCC)   Pericardial effusion   Noncompliance of patient with renal dialysis [facility])     Subjective   25 year old female with a history of end-stage renal disease due to polycystic kidney disease. She has severe systolic congestive heart failure with an ejection fraction of 12%. She hasn't AICD. She has history of noncompliance and pulmonary was. She presents to the hospital after missing dialysis for a week. She is severely volume overloaded.  Inpatient Medications    Scheduled Meds: . calcitonin (salmon)  1 spray Alternating Nares Daily  . [START ON 08/14/2016] calcitRIOL  0.5 mcg Oral Q M,W,F-HD  . cinacalcet  30 mg Oral BID WC  . multivitamin  1 tablet Oral QHS  . naproxen  500 mg Oral BID WC  . sertraline  100 mg Oral Daily  . sodium chloride flush  3 mL Intravenous Q12H  . sodium chloride flush  3 mL Intravenous Q12H   Continuous Infusions:   PRN Meds: sodium chloride, famotidine, methocarbamol (ROBAXIN)  IV, morphine injection, ondansetron **OR** ondansetron (ZOFRAN) IV, oxyCODONE, sodium chloride flush   Vital Signs    Vitals:   08/13/16 0042 08/13/16 0048 08/13/16 0152 08/13/16 0542  BP: (!) 137/125  (!) 136/92 (!) 135/100  Pulse:  101 100 (!) 104  Resp: 19 20 20 18   Temp:   98.2 F (36.8 C) 98.1 F (36.7 C)  TempSrc:   Oral Oral  SpO2:  97% 99% 99%    Intake/Output Summary (Last 24 hours) at 08/13/16 0913 Last data filed at 08/13/16 1610  Gross per 24 hour  Intake              123 ml  Output                0 ml  Net              123 ml   There were no vitals filed for this visit.  Physical Exam    GEN: Well  nourished, well developed, in no acute distress.  HEENT: Grossly normal.  Neck: Supple, no JVD, carotid bruits, or masses. Cardiac:  RR , tachy,   + loud S3,  Loud 3 component friction rub.    1-2 + edema   Respiratory:  Respirations regular and unlabored, clear to auscultation bilaterally. GI: Soft, nontender, nondistended, BS + x 4. MS:  + edema  Skin: warm and dry, no rash. Neuro:  Strength and sensation are intact. Psych: AAOx3.  Normal affect.  Labs    CBC  Recent Labs  08/12/16 2122 08/13/16 0840  WBC 8.4 7.4  HGB 10.6* 10.1*  HCT 33.2* 32.1*  MCV 86.5 87.2  PLT 276 256   Basic Metabolic Panel  Recent Labs  08/12/16 2122  NA 140  K 5.3*  CL 103  CO2 24  GLUCOSE 72  BUN 84*  CREATININE 10.98*  CALCIUM 8.9   Liver Function Tests No results for input(s): AST, ALT, ALKPHOS, BILITOT, PROT, ALBUMIN in the last 72 hours. No results for input(s): LIPASE, AMYLASE in the last 72 hours. Cardiac Enzymes  Recent Labs  08/13/16 0220  TROPONINI 0.27*   BNP Invalid input(s): POCBNP D-Dimer  No results for input(s): DDIMER in the last 72 hours. Hemoglobin A1C No results for input(s): HGBA1C in the last 72 hours. Fasting Lipid Panel No results for input(s): CHOL, HDL, LDLCALC, TRIG, CHOLHDL, LDLDIRECT in the last 72 hours. Thyroid Function Tests No results for input(s): TSH, T4TOTAL, T3FREE, THYROIDAB in the last 72 hours.  Invalid input(s): FREET3  Telemetry     sinus tachy  Personally Reviewed  ECG    Sinus tach /junctional tachy .   TWI in the inferior and lateral leads.   = Personally Reviewed  Radiology    Dg Chest 2 View  Result Date: 08/12/2016 CLINICAL DATA:  Chest pain with dyspnea EXAM: CHEST  2 VIEW COMPARISON:  07/27/2016 thoracic spine CT, chest CT from 06/17/2016 FINDINGS: There is cardiomegaly with bibasilar atelectasis and right small pleural effusion. The aorta is not aneurysmal. Right-sided AICD device with right ventricular lead is  again seen which appears intact. There is 50% compression of what may represent the T12 vertebral body when using the apex of a pre-existing IVC filter to determine levels based on the filter location on prior CT. The T7 compression fracture is partially obscured by the patient's chair vascular clips project over the visualized distal right arm and elbow. IMPRESSION: Stable cardiomegaly. Bibasilar atelectasis with small right pleural effusion. There appears to be a 50% compression fracture of what may represent T12, new since previous thoracic spine CT from 07/27/2016. Electronically Signed   By: Tollie Ethavid  Kwon M.D.   On: 08/12/2016 22:38    Cardiac Studies     Patient Profile     25 y.o. female with medical history significant of ESRD on dialysis due to childhood polycystic kidney disease, severe systolic EF 12%, AICD, noncompliance, PE comes in with missing dialysis this week  Assessment & Plan    1. Acute on chronic systolic congestive heart failure: Her congestive heart failure is likely exacerbate it by an may be completely due to her renal failure. She has never started on dialysis yet. She got scared and did not show up for her dialysis appointments. She is volume overloaded now has a loud S3 gallop. She also has a loud friction rub. She needs emergent dialysis today.  I suspect the that much of her heart failure will improve after dialysis. No further cardiac evaluation at this time. She had an echocardiogram in June that showed an ejection fraction of around 20%. Her Myoview study in September reveals an ejection fraction of 12%. I do not think that an echocardiogram would be helpful at this time because her ejection fraction is still clearly low and she is volume overloaded. I suggest getting a repeat echocardiogram after she's been on dialysis for several weeks/several months.  I've spoken with Dr. Alferd PateeMadeira. She has had multiple hospitalizations with the same symptoms. She does not go to  dialysis as an outpatient. I discussed with her how dangerous this is. Dr. Alferd PateeMadeira has discussed with her the danger of her situation.  At this point there is nothing further that we can offer from a cardiology standpoint. She needs dialysis on a consistent basis.  We will sign off. Please call us for questions.  Signed, Kristeen MissPhilip Beata Beason, MD  08/13/2016, 9:13 AM

## 2016-08-14 DIAGNOSIS — R0602 Shortness of breath: Secondary | ICD-10-CM

## 2016-08-14 DIAGNOSIS — G8929 Other chronic pain: Secondary | ICD-10-CM

## 2016-08-14 DIAGNOSIS — M546 Pain in thoracic spine: Secondary | ICD-10-CM

## 2016-08-14 LAB — RENAL FUNCTION PANEL
Albumin: 2.9 g/dL — ABNORMAL LOW (ref 3.5–5.0)
Anion gap: 14 (ref 5–15)
BUN: 103 mg/dL — AB (ref 6–20)
CHLORIDE: 103 mmol/L (ref 101–111)
CO2: 20 mmol/L — AB (ref 22–32)
CREATININE: 12.32 mg/dL — AB (ref 0.44–1.00)
Calcium: 7.7 mg/dL — ABNORMAL LOW (ref 8.9–10.3)
GFR calc non Af Amer: 4 mL/min — ABNORMAL LOW (ref >60)
GFR, EST AFRICAN AMERICAN: 4 mL/min — AB (ref >60)
Glucose, Bld: 98 mg/dL (ref 65–99)
Phosphorus: 7.5 mg/dL — ABNORMAL HIGH (ref 2.5–4.6)
Potassium: 6.4 mmol/L (ref 3.5–5.1)
Sodium: 137 mmol/L (ref 135–145)

## 2016-08-14 LAB — CBC
HCT: 29.9 % — ABNORMAL LOW (ref 36.0–46.0)
HEMOGLOBIN: 9.5 g/dL — AB (ref 12.0–15.0)
MCH: 27.8 pg (ref 26.0–34.0)
MCHC: 31.8 g/dL (ref 30.0–36.0)
MCV: 87.4 fL (ref 78.0–100.0)
PLATELETS: 240 10*3/uL (ref 150–400)
RBC: 3.42 MIL/uL — AB (ref 3.87–5.11)
RDW: 20.1 % — ABNORMAL HIGH (ref 11.5–15.5)
WBC: 9 10*3/uL (ref 4.0–10.5)

## 2016-08-14 MED ORDER — LIDOCAINE-PRILOCAINE 2.5-2.5 % EX CREA
1.0000 "application " | TOPICAL_CREAM | CUTANEOUS | Status: DC | PRN
  Filled 2016-08-14: qty 5

## 2016-08-14 MED ORDER — PENTAFLUOROPROP-TETRAFLUOROETH EX AERO: 1.0000 "application " | INHALATION_SPRAY | CUTANEOUS | Status: DC | PRN

## 2016-08-14 MED ORDER — CALCITRIOL 0.5 MCG PO CAPS
1.0000 ug | ORAL_CAPSULE | ORAL | Status: DC
  Administered 2016-08-16 – 2016-08-18 (×2): 1 ug via ORAL
  Filled 2016-08-14 (×2): qty 2

## 2016-08-14 MED ORDER — HYDROMORPHONE HCL 1 MG/ML IJ SOLN
2.0000 mg | INTRAMUSCULAR | Status: DC | PRN
  Administered 2016-08-14 – 2016-08-18 (×18): 2 mg via INTRAVENOUS
  Filled 2016-08-14 (×16): qty 2

## 2016-08-14 MED ORDER — HEPARIN SODIUM (PORCINE) 1000 UNIT/ML DIALYSIS
1000.0000 [IU] | INTRAMUSCULAR | Status: DC | PRN
  Filled 2016-08-14: qty 1

## 2016-08-14 MED ORDER — LIDOCAINE HCL (PF) 1 % IJ SOLN: 5.0000 mL | INTRAMUSCULAR | Status: DC | PRN

## 2016-08-14 MED ORDER — DIPHENHYDRAMINE HCL 25 MG PO CAPS
ORAL_CAPSULE | ORAL | Status: AC
  Administered 2016-08-14: 50 mg via ORAL
  Filled 2016-08-14: qty 2

## 2016-08-14 MED ORDER — CINACALCET HCL 30 MG PO TABS
90.0000 mg | ORAL_TABLET | Freq: Every day | ORAL | Status: DC
Start: 2016-08-14 — End: 2016-08-18
  Administered 2016-08-14 – 2016-08-17 (×4): 90 mg via ORAL
  Filled 2016-08-14 (×5): qty 3

## 2016-08-14 MED ORDER — METHOCARBAMOL 500 MG PO TABS
500.0000 mg | ORAL_TABLET | Freq: Three times a day (TID) | ORAL | Status: DC | PRN
  Administered 2016-08-14 – 2016-08-18 (×4): 500 mg via ORAL
  Filled 2016-08-14 (×4): qty 1

## 2016-08-14 MED ORDER — SODIUM CHLORIDE 0.9 % IV SOLN: 100.0000 mL | INTRAVENOUS | Status: DC | PRN

## 2016-08-14 MED ORDER — DIPHENHYDRAMINE HCL 25 MG PO CAPS
50.0000 mg | ORAL_CAPSULE | Freq: Four times a day (QID) | ORAL | Status: DC | PRN
  Administered 2016-08-14 – 2016-08-16 (×3): 50 mg via ORAL
  Filled 2016-08-14 (×2): qty 2

## 2016-08-14 MED ORDER — ALTEPLASE 2 MG IJ SOLR: 2.0000 mg | Freq: Once | INTRAMUSCULAR | Status: DC | PRN

## 2016-08-14 NOTE — Progress Notes (Signed)
Report received ,via Tour manager, in patient's room, using [employer], Reviewed VS, Labs, Tests, POC and patient's general condition, assumed care of patient.

## 2016-08-14 NOTE — Plan of Care (Signed)
Problem: Safety: Goal: Ability to remain free from injury will improve Outcome: Progressing Reviewed with patient to call for assistance, call light within reach and bedside stand within reach. Patient is also aware of how to call RN/NT via phone, bed alarm on and patient is aware and was instructed as to why it's on.

## 2016-08-14 NOTE — Care Management Note (Addendum)
Case Management Note  Patient Details  Name: [name] Small MRN: [id number] Date of Birth: 1990-10-27  Subjective/Objective:  Pt presented for volume overload. Per MD notes noncompliance with HD treatments. Tonia @  [facility] did call CM to see when patient was admitted. Pt was previously active with [facility] for RN, PT, OT services.                Action/Plan: [facility] aware that pt is hospitalized. Plan to follow post d/c. Pt will need resumption orders once stable for d/c. CM did speak with pt in regards to CSW assisting with transportation and pt stated she is not having difficulty with transportation. No further needs at this time.   Expected Discharge Date:                  Expected Discharge Plan:  Home w Home Health Services  In-House Referral:  NA  Discharge planning Services  CM Consult  Post Acute Care Choice:  Home Health, Resumption of Svcs/PTA Provider Choice offered to:  Patient  DME Arranged:  N/A DME Agency:     HH Arranged:  RN, PT, OT HH Agency:  [facility]  Status of Service:  Completed.  If discussed at Long Length of Stay Meetings, dates discussed:    Additional Comments: 1156 08-18-16 Tomi Bamberger, RN,BSN [phone] CM did speak with Ottie Glazier Rehab Admission Coordinator and she will be able to admit the patient to CIR 08-18-16. No needs from CM.  Gala Lewandowsky, RN 08/14/2016, 3:54 PM

## 2016-08-14 NOTE — Progress Notes (Signed)
Subjective: Interval History: has complaints back pain.  Objective: Vital signs in last 24 hours: Temp:  [97.4 F (36.3 C)-97.9 F (36.6 C)] 97.9 F (36.6 C) (10/23 0553) Pulse Rate:  [83-91] 83 (10/23 0553) Resp:  [15-22] 15 (10/23 0553) BP: (111-131)/(84-107) 111/84 (10/23 0553) SpO2:  [99 %-100 %] 99 % (10/23 0553) Weight:  [59.7 kg (131 lb 11.2 oz)] 59.7 kg (131 lb 11.2 oz) (10/23 0553) Weight change:   Intake/Output from previous day: 10/22 0701 - 10/23 0700 In: 643 [P.O.:540; I.V.:3; IV Piggyback:100] Out: 0  Intake/Output this shift: No intake/output data recorded.  General appearance: cooperative, mild distress and slowed mentation Resp: diminished breath sounds bilaterally and rales bibasilar Cardio: S1, S2 normal, systolic murmur: holosystolic 2/6, blowing at apex and friction rub heard throughout precordium GI: liver down 7 cm Extremities: edema 2+ and L Fem PC  Lab Results:  Recent Labs  08/12/16 2122 08/13/16 0840  WBC 8.4 7.4  HGB 10.6* 10.1*  HCT 33.2* 32.1*  PLT 276 256   BMET:  Recent Labs  08/12/16 2122 08/13/16 0840  NA 140 138  K 5.3* 5.4*  CL 103 104  CO2 24 20*  GLUCOSE 72 100*  BUN 84* 89*  CREATININE 10.98* 11.08*  CALCIUM 8.9 9.2   No results for input(s): PTH in the last 72 hours. Iron Studies: No results for input(s): IRON, TIBC, TRANSFERRIN, FERRITIN in the last 72 hours.  Studies/Results: Dg Chest 2 View  Result Date: 08/12/2016 CLINICAL DATA:  Chest pain with dyspnea EXAM: CHEST  2 VIEW COMPARISON:  07/27/2016 thoracic spine CT, chest CT from 06/17/2016 FINDINGS: There is cardiomegaly with bibasilar atelectasis and right small pleural effusion. The aorta is not aneurysmal. Right-sided AICD device with right ventricular lead is again seen which appears intact. There is 50% compression of what may represent the T12 vertebral body when using the apex of a pre-existing IVC filter to determine levels based on the filter location on  prior CT. The T7 compression fracture is partially obscured by the patient's chair vascular clips project over the visualized distal right arm and elbow. IMPRESSION: Stable cardiomegaly. Bibasilar atelectasis with small right pleural effusion. There appears to be a 50% compression fracture of what may represent T12, new since previous thoracic spine CT from 07/27/2016. Electronically Signed   By: Tollie Eth M.D.   On: 08/12/2016 22:38    I have reviewed the patient's current medications.  Assessment/Plan: 1 ESRD vol xs , uremic, pericarditis,  Needs daily HD 2 Anemia Use ESA, check Fe,  3 HPTH check, use po vit D, Cinnacalcet 4 Pericarditis presume uremic 5 Low EF with ICD  6 Nonadherence.   7 Social situation 8 Access  Needs leg AVG P HD daily, ESA, Check PTH   LOS: 1 day   [name] Small L 08/14/2016,7:07 AM

## 2016-08-14 NOTE — Progress Notes (Signed)
[facility] PROGRESS NOTE  [name] Small SFS:[id number] DOB: 1991-05-05 DOA: 08/12/2016 PCP: Jeanann Lewandowsky, MD  Interim summary and HPI 25 y.o. female with medical history significant of ESRD on dialysis due to childhood polycystic kidney disease, severe systolic EF 12%, AICD, noncompliance, PE comes in with missing dialysis this week.  Pt reports that her transportation was not set up at her discharge last week and that is why she missed her sessions all week.  Denies fevers.  C/o chest pain and back pain.  mainly her back pain.  She is still having back pain from her recent compression fractures.  Pt doing well on RA.  Referred for admission for need for dialysis  Assessment/Plan: 1-CP and SOB: due to pulmonary edema and acute on chronic systolic CHF. -patient is HD dependent for volume control and has missed over a week of treatments -plan is for HD (on daily basis to achieve volume control) -EF in 12%, patient status post ICD -no further work up anticipated by cardiology at this time -her troponin elevation due to ESRD and demand ischemia -due to low BP not on b-blockers or hydralazine/IMDUR -no ACE/ARB due to renal failure  2-ESRD: -renal service consulted -will follow rec's -need aggressive HD therapy  3-anemia of chronic renal disease  -IV iron and aranesp as per renal discretion -no signs of acute bleeding   4-secondary hyperparathyroidism -will continue sevelamer, cinacalcet and Calcitrol   5-depression: -will continue Sertraline   6-chronic back pain: due to compression fractures (T7-T8-T12) -Continue TLSO brace -continue PRN analgesics  7-hyperkalemia: to be corrected with HD -will monitor electrolytes   8-HTN: -stable and well controlled w/o meds -continue HD   Code Status: Full Family Communication: no family at bedside  Disposition Plan: home when volume status is corrected and patient is medically  stable.   Consultants:  Cardiology  Renal service   Procedures:  See below for x-ray reports  Inpatient HD therapy   Antibiotics:  None   HPI/Subjective: Afebrile, denies CP. Complaining of pain her back and endorses some difficulty taking deep breath.   Objective: Vitals:   08/14/16 1601 08/14/16 1630  BP: 121/78 114/77  Pulse: 90 91  Resp: 18 16  Temp:      Intake/Output Summary (Last 24 hours) at 08/14/16 1655 Last data filed at 08/13/16 2200  Gross per 24 hour  Intake              230 ml  Output                0 ml  Net              230 ml   Filed Weights   08/14/16 0553 08/14/16 1415  Weight: 59.7 kg (131 lb 11.2 oz) 61.4 kg (135 lb 5.8 oz)    Exam:   General:  Complaining of back pain; good O2 sat on RA. Afebrile. Currently w/o CP.  Cardiovascular: S1 and S2, no gallops, positive SEM and soft Rub); positive JVD  Respiratory: positive crackles, decrease Bs at bases, no wheezing.  Abdomen: soft, NT, ND, positive BS  Musculoskeletal: 2++ edema bilaterally; also with swelling in her arms.  Data Reviewed: Basic Metabolic Panel:  Recent Labs Lab 08/12/16 2122 08/13/16 0840 08/14/16 1450  NA 140 138 137  K 5.3* 5.4* 6.4*  CL 103 104 103  CO2 24 20* 20*  GLUCOSE 72 100* 98  BUN 84* 89* 103*  CREATININE 10.98* 11.08* 12.32*  CALCIUM 8.9 9.2 7.7*  PHOS  --   --  7.5*   Liver Function Tests:  Recent Labs Lab 08/14/16 1450  ALBUMIN 2.9*   CBC:  Recent Labs Lab 08/12/16 2122 08/13/16 0840 08/14/16 1450  WBC 8.4 7.4 9.0  HGB 10.6* 10.1* 9.5*  HCT 33.2* 32.1* 29.9*  MCV 86.5 87.2 87.4  PLT 276 256 240   Cardiac Enzymes:  Recent Labs Lab 08/13/16 0220 08/13/16 0840 08/13/16 1354  TROPONINI 0.27* 0.25* 0.22*   BNP (last 3 results)  Recent Labs  09/29/15 1242 04/11/16 1130 07/26/16 1803  BNP 2,492.9* >4,500.0* >4,500.0*    Studies: Dg Chest 2 View  Result Date: 08/12/2016 CLINICAL DATA:  Chest pain with dyspnea  EXAM: CHEST  2 VIEW COMPARISON:  07/27/2016 thoracic spine CT, chest CT from 06/17/2016 FINDINGS: There is cardiomegaly with bibasilar atelectasis and right small pleural effusion. The aorta is not aneurysmal. Right-sided AICD device with right ventricular lead is again seen which appears intact. There is 50% compression of what may represent the T12 vertebral body when using the apex of a pre-existing IVC filter to determine levels based on the filter location on prior CT. The T7 compression fracture is partially obscured by the patient's chair vascular clips project over the visualized distal right arm and elbow. IMPRESSION: Stable cardiomegaly. Bibasilar atelectasis with small right pleural effusion. There appears to be a 50% compression fracture of what may represent T12, new since previous thoracic spine CT from 07/27/2016. Electronically Signed   By: Tollie Ethavid  Kwon M.D.   On: 08/12/2016 22:38    Scheduled Meds: . calcitonin (salmon)  1 spray Alternating Nares Daily  . calcitRIOL  1 mcg Oral Q M,W,F-HD  . cinacalcet  90 mg Oral Q supper  . multivitamin  1 tablet Oral QHS  . naproxen  500 mg Oral BID WC  . sertraline  100 mg Oral Daily  . sevelamer carbonate  2,400 mg Oral TID WC  . sodium chloride flush  3 mL Intravenous Q12H  . sodium chloride flush  3 mL Intravenous Q12H   Continuous Infusions:   Principal Problem:   Chest pain Active Problems:   ESRD (end stage renal disease) on dialysis (HCC)   Chronically Elevated troponin   AICD (automatic cardioverter/defibrillator) present   NICM (nonischemic cardiomyopathy) (HCC)   Pericardial effusion   Noncompliance of patient with renal dialysis (HCC)    Time spent: 30 minutes    Vassie LollMadera, Myeasha Ballowe  [facility] Pager [phone]. If 7PM-7AM, please contact night-coverage at [email or web address], password [email or web address] 08/14/2016, 4:55 PM  LOS: 1 day

## 2016-08-15 ENCOUNTER — Encounter (HOSPITAL_COMMUNITY): Payer: Self-pay | Admitting: General Practice

## 2016-08-15 LAB — COMPREHENSIVE METABOLIC PANEL
ALBUMIN: 2.7 g/dL — AB (ref 3.5–5.0)
ALT: <5 U/L — ABNORMAL LOW (ref 14–54)
ANION GAP: 14 (ref 5–15)
AST: 24 U/L (ref 15–41)
Alkaline Phosphatase: 259 U/L — ABNORMAL HIGH (ref 38–126)
BUN: 45 mg/dL — AB (ref 6–20)
CHLORIDE: 97 mmol/L — AB (ref 101–111)
CO2: 23 mmol/L (ref 22–32)
Calcium: 8 mg/dL — ABNORMAL LOW (ref 8.9–10.3)
Creatinine, Ser: 6.93 mg/dL — ABNORMAL HIGH (ref 0.44–1.00)
GFR calc Af Amer: 9 mL/min — ABNORMAL LOW (ref >60)
GFR calc non Af Amer: 7 mL/min — ABNORMAL LOW (ref >60)
GLUCOSE: 76 mg/dL (ref 65–99)
POTASSIUM: 4.2 mmol/L (ref 3.5–5.1)
SODIUM: 134 mmol/L — AB (ref 135–145)
TOTAL PROTEIN: 6.7 g/dL (ref 6.5–8.1)
Total Bilirubin: 1.3 mg/dL — ABNORMAL HIGH (ref 0.3–1.2)

## 2016-08-15 LAB — CBC
HCT: 28.6 % — ABNORMAL LOW (ref 36.0–46.0)
Hemoglobin: 9 g/dL — ABNORMAL LOW (ref 12.0–15.0)
MCH: 27.6 pg (ref 26.0–34.0)
MCHC: 31.5 g/dL (ref 30.0–36.0)
MCV: 87.7 fL (ref 78.0–100.0)
PLATELETS: 196 10*3/uL (ref 150–400)
RBC: 3.26 MIL/uL — AB (ref 3.87–5.11)
RDW: 20.1 % — AB (ref 11.5–15.5)
WBC: 9.1 10*3/uL (ref 4.0–10.5)

## 2016-08-15 LAB — PHOSPHORUS: PHOSPHORUS: 5.1 mg/dL — AB (ref 2.5–4.6)

## 2016-08-15 MED ORDER — HYDROMORPHONE HCL 1 MG/ML IJ SOLN
INTRAMUSCULAR | Status: AC
  Administered 2016-08-15: 2 mg via INTRAVENOUS
  Filled 2016-08-15: qty 2

## 2016-08-15 MED ORDER — HYDROMORPHONE HCL 2 MG PO TABS
ORAL_TABLET | ORAL | Status: AC
  Filled 2016-08-15: qty 1

## 2016-08-15 MED ORDER — DARBEPOETIN ALFA 100 MCG/0.5ML IJ SOSY
PREFILLED_SYRINGE | INTRAMUSCULAR | Status: AC
  Filled 2016-08-15: qty 0.5

## 2016-08-15 MED ORDER — DARBEPOETIN ALFA 100 MCG/0.5ML IJ SOSY
100.0000 ug | PREFILLED_SYRINGE | INTRAMUSCULAR | Status: DC
  Administered 2016-08-15: 100 ug via INTRAVENOUS
  Filled 2016-08-15: qty 0.5

## 2016-08-15 NOTE — Plan of Care (Signed)
Problem: Safety: Goal: Ability to remain free from injury will improve Outcome: Completed/Met Date Met: 08/15/16 Patient uses call light as instructed and calls for assistance as instructed, all personal belongings within reach.

## 2016-08-15 NOTE — Plan of Care (Signed)
Problem: Pain Managment: Goal: General experience of comfort will improve Outcome: Progressing Instructed patient on breathing and relaxation techniques to help control her pain and she was did a return demonstration, will continue to monitor and assist her as needed.

## 2016-08-15 NOTE — Procedures (Signed)
I was present at this session.  I have reviewed the session itself and made appropriate changes.  HD via L fem cath. Bps 90-110.  Will see how does with vol off.  Jera Headings L 10/24/20177:58 AM

## 2016-08-15 NOTE — Progress Notes (Signed)
Subjective: Interval History: has complaints Hurts to cough or deep breathe.  Objective: Vital signs in last 24 hours: Temp:  [98 F (36.7 C)-98.6 F (37 C)] 98.6 F (37 C) (10/24 0548) Pulse Rate:  [82-113] 113 (10/24 0548) Resp:  [12-25] 15 (10/24 0548) BP: (93-125)/(69-97) 111/75 (10/24 0548) SpO2:  [96 %-100 %] 99 % (10/24 0548) Weight:  [58 kg (127 lb 13.9 oz)-61.4 kg (135 lb 5.8 oz)] 58 kg (127 lb 13.9 oz) (10/24 0548) Weight change: 1.661 kg (3 lb 10.6 oz)  Intake/Output from previous day: 10/23 0701 - 10/24 0700 In: -  Out: 3500  Intake/Output this shift: No intake/output data recorded.  General appearance: alert, cooperative and mild distress Resp: diminished breath sounds bibasilar and rales bibasilar Chest wall: brace Cardio: S1, S2 normal, systolic murmur: holosystolic 2/6, blowing at apex and friction rub heard 3 component,loud GI: at apex Extremities: edema 1+, L fem cath and L fem cath  Lab Results:  Recent Labs  08/14/16 1450 08/15/16 0716  WBC 9.0 9.1  HGB 9.5* 9.0*  HCT 29.9* 28.6*  PLT 240 PENDING   BMET:  Recent Labs  08/13/16 0840 08/14/16 1450  NA 138 137  K 5.4* 6.4*  CL 104 103  CO2 20* 20*  GLUCOSE 100* 98  BUN 89* 103*  CREATININE 11.08* 12.32*  CALCIUM 9.2 7.7*   No results for input(s): PTH in the last 72 hours. Iron Studies: No results for input(s): IRON, TIBC, TRANSFERRIN, FERRITIN in the last 72 hours.  Studies/Results: No results found.  I have reviewed the patient's current medications.  Assessment/Plan: 1 ESRD uremic , vol xs improving.  Still pericarditis.   2 Pericarditis 3 Anemia lower vol should help , give esa 4 CM with ICD 5 Access with need fem AVG 6 HPTH vit D, cinn 7 Nonadherence 8comp fx P HD, again tomorrow,  Vit D, esa, cinn    LOS: 2 days   Inessa Wardrop L 08/15/2016,8:01 AM

## 2016-08-15 NOTE — Progress Notes (Addendum)
[facility] PROGRESS NOTE  [name] Small PJS:[id number] DOB: 29-Jan-1991 DOA: 08/12/2016 PCP: Jeanann Lewandowsky, MD  Interim summary and HPI 25 y.o. female with medical history significant of ESRD on dialysis due to childhood polycystic kidney disease, severe systolic EF 12%, AICD, noncompliance, PE comes in with missing dialysis this week.  Pt reports that her transportation was not set up at her discharge last week and that is why she missed her sessions all week.  Denies fevers.  C/o chest pain and back pain.  mainly her back pain.  She is still having back pain from her recent compression fractures.  Pt doing well on RA.  Referred for admission for need for dialysis.  Assessment/Plan: 1-CP and SOB: due to pulmonary edema and acute on chronic systolic CHF. -patient is HD dependent for volume control and has missed over a week of treatments -with positive rub on exam and mil pericardial effusion assessed by US done at bedside by EDP, high concerns for pericarditis  -plan is for HD (on daily basis currently to achieve volume control) -EF in 12%, patient status post ICD -no further work up anticipated by cardiology at this time -her troponin elevation due to ESRD and demand ischemia -due to low BP not on b-blockers or hydralazine/IMDUR -no ACE/ARB due to renal failure  2-ESRD: -renal service consulted -will follow rec's -need aggressive HD therapy -currently receiving daily treatments; next HD planned for 10/25  3-anemia of chronic renal disease  -IV iron and aranesp as per renal discretion -no signs of acute bleeding  -Hgb 9.0   4-secondary hyperparathyroidism -will continue sevelamer, cinacalcet and Calcitrol   5-depression: -will continue Sertraline   6-chronic back pain: due to compression fractures (T7-T8-T12) -Continue TLSO brace -continue PRN analgesics  7-hyperkalemia: to be corrected with HD -will monitor electrolytes   8-HTN: -stable and well controlled  w/o meds -continue HD   Code Status: Full Family Communication: no family at bedside  Disposition Plan: home when volume status is corrected and patient is medically stable.   Consultants:  Cardiology  Renal service   Procedures:  See below for x-ray reports  Inpatient HD therapy   Antibiotics:  None   HPI/Subjective: Afebrile, reports improvement in her breathing. Still complaining of back pain and endorses intermittent discomfort in her chest with deep breath and coughing spells.    Objective: Vitals:   08/15/16 1130 08/15/16 1136  BP: (!) 86/63 106/70  Pulse: (!) 104 99  Resp:  16  Temp:  98.1 F (36.7 C)    Intake/Output Summary (Last 24 hours) at 08/15/16 1443 Last data filed at 08/15/16 1136  Gross per 24 hour  Intake                0 ml  Output             5501 ml  Net            -5501 ml   Filed Weights   08/15/16 0548 08/15/16 0712 08/15/16 1136  Weight: 58 kg (127 lb 13.9 oz) 56.7 kg (125 lb) 54.8 kg (120 lb 13 oz)    Exam:   General: Still Complaining of back pain; good O2 sat on RA. Afebrile. Described some intermittent chest discomfort with deep breaths and when coughing. Fluid overload status improving.  Cardiovascular: S1 and S2, no gallops, positive SEM and soft Rub; positive JVD  Respiratory: positive crackles, decrease Bs at bases, no wheezing.  Abdomen: soft, NT, ND, positive BS  Musculoskeletal: 1++  edema bilaterally; also with swelling in her arms.  Data Reviewed: Basic Metabolic Panel:  Recent Labs Lab 08/12/16 2122 08/13/16 0840 08/14/16 1450 08/15/16 0716  NA 140 138 137 134*  K 5.3* 5.4* 6.4* 4.2  CL 103 104 103 97*  CO2 24 20* 20* 23  GLUCOSE 72 100* 98 76  BUN 84* 89* 103* 45*  CREATININE 10.98* 11.08* 12.32* 6.93*  CALCIUM 8.9 9.2 7.7* 8.0*  PHOS  --   --  7.5* 5.1*   Liver Function Tests:  Recent Labs Lab 08/14/16 1450 08/15/16 0716  AST  --  24  ALT  --  <5*  ALKPHOS  --  259*  BILITOT  --  1.3*   PROT  --  6.7  ALBUMIN 2.9* 2.7*   CBC:  Recent Labs Lab 08/12/16 2122 08/13/16 0840 08/14/16 1450 08/15/16 0716  WBC 8.4 7.4 9.0 9.1  HGB 10.6* 10.1* 9.5* 9.0*  HCT 33.2* 32.1* 29.9* 28.6*  MCV 86.5 87.2 87.4 87.7  PLT 276 256 240 196   Cardiac Enzymes:  Recent Labs Lab 08/13/16 0220 08/13/16 0840 08/13/16 1354  TROPONINI 0.27* 0.25* 0.22*   BNP (last 3 results)  Recent Labs  09/29/15 1242 04/11/16 1130 07/26/16 1803  BNP 2,492.9* >4,500.0* >4,500.0*    Studies: No results found.  Scheduled Meds: . calcitonin (salmon)  1 spray Alternating Nares Daily  . calcitRIOL  1 mcg Oral Q M,W,F-HD  . cinacalcet  90 mg Oral Q supper  . darbepoetin (ARANESP) injection - DIALYSIS  100 mcg Intravenous Q Tue-HD  . multivitamin  1 tablet Oral QHS  . naproxen  500 mg Oral BID WC  . sertraline  100 mg Oral Daily  . sevelamer carbonate  2,400 mg Oral TID WC  . sodium chloride flush  3 mL Intravenous Q12H  . sodium chloride flush  3 mL Intravenous Q12H   Continuous Infusions:   Principal Problem:   Chest pain Active Problems:   ESRD (end stage renal disease) on dialysis (HCC)   Chronically Elevated troponin   AICD (automatic cardioverter/defibrillator) present   NICM (nonischemic cardiomyopathy) (HCC)   Pericardial effusion   Noncompliance of patient with renal dialysis (HCC)    Time spent: 30 minutes    Vassie LollMadera, Kaylin Marcon  [facility] Pager [phone]. If 7PM-7AM, please contact night-coverage at [email or web address], password [email or web address] 08/15/2016, 2:43 PM  LOS: 2 days

## 2016-08-15 NOTE — Progress Notes (Signed)
Updated report received but patient remains in Dialysis at this time, will assume responsibility of patient when she returns from [facility].

## 2016-08-16 DIAGNOSIS — I428 Other cardiomyopathies: Secondary | ICD-10-CM

## 2016-08-16 DIAGNOSIS — N189 Chronic kidney disease, unspecified: Secondary | ICD-10-CM

## 2016-08-16 DIAGNOSIS — I5023 Acute on chronic systolic (congestive) heart failure: Secondary | ICD-10-CM

## 2016-08-16 DIAGNOSIS — Z9115 Patient's noncompliance with renal dialysis: Secondary | ICD-10-CM

## 2016-08-16 DIAGNOSIS — I32 Pericarditis in diseases classified elsewhere: Secondary | ICD-10-CM

## 2016-08-16 LAB — RENAL FUNCTION PANEL
ALBUMIN: 2.7 g/dL — AB (ref 3.5–5.0)
Anion gap: 13 (ref 5–15)
BUN: 35 mg/dL — AB (ref 6–20)
CALCIUM: 9.5 mg/dL (ref 8.9–10.3)
CO2: 26 mmol/L (ref 22–32)
Chloride: 101 mmol/L (ref 101–111)
Creatinine, Ser: 5.07 mg/dL — ABNORMAL HIGH (ref 0.44–1.00)
GFR calc Af Amer: 13 mL/min — ABNORMAL LOW (ref >60)
GFR, EST NON AFRICAN AMERICAN: 11 mL/min — AB (ref >60)
GLUCOSE: 79 mg/dL (ref 65–99)
PHOSPHORUS: 5.5 mg/dL — AB (ref 2.5–4.6)
Potassium: 4.1 mmol/L (ref 3.5–5.1)
SODIUM: 140 mmol/L (ref 135–145)

## 2016-08-16 LAB — CBC
HCT: 29.3 % — ABNORMAL LOW (ref 36.0–46.0)
Hemoglobin: 9.1 g/dL — ABNORMAL LOW (ref 12.0–15.0)
MCH: 27.2 pg (ref 26.0–34.0)
MCHC: 31.1 g/dL (ref 30.0–36.0)
MCV: 87.5 fL (ref 78.0–100.0)
PLATELETS: 241 10*3/uL (ref 150–400)
RBC: 3.35 MIL/uL — ABNORMAL LOW (ref 3.87–5.11)
RDW: 19.9 % — AB (ref 11.5–15.5)
WBC: 9.5 10*3/uL (ref 4.0–10.5)

## 2016-08-16 MED ORDER — SODIUM CHLORIDE 0.9 % IV SOLN: 100.0000 mL | INTRAVENOUS | Status: DC | PRN

## 2016-08-16 MED ORDER — HYDROMORPHONE HCL 2 MG PO TABS
ORAL_TABLET | ORAL | Status: AC
  Filled 2016-08-16: qty 2

## 2016-08-16 MED ORDER — DIPHENHYDRAMINE HCL 25 MG PO CAPS
ORAL_CAPSULE | ORAL | Status: AC
  Filled 2016-08-16: qty 2

## 2016-08-16 MED ORDER — HEPARIN SODIUM (PORCINE) 1000 UNIT/ML DIALYSIS
1000.0000 [IU] | INTRAMUSCULAR | Status: DC | PRN
  Filled 2016-08-16: qty 1

## 2016-08-16 MED ORDER — LIDOCAINE-PRILOCAINE 2.5-2.5 % EX CREA: 1.0000 "application " | TOPICAL_CREAM | CUTANEOUS | Status: DC | PRN

## 2016-08-16 MED ORDER — ALTEPLASE 2 MG IJ SOLR: 2.0000 mg | Freq: Once | INTRAMUSCULAR | Status: DC | PRN

## 2016-08-16 MED ORDER — PENTAFLUOROPROP-TETRAFLUOROETH EX AERO: 1.0000 "application " | INHALATION_SPRAY | CUTANEOUS | Status: DC | PRN

## 2016-08-16 MED ORDER — HYDROMORPHONE HCL 1 MG/ML IJ SOLN
INTRAMUSCULAR | Status: AC
  Administered 2016-08-16: 2 mg via INTRAVENOUS
  Filled 2016-08-16: qty 2

## 2016-08-16 MED ORDER — LIDOCAINE HCL (PF) 1 % IJ SOLN: 5.0000 mL | INTRAMUSCULAR | Status: DC | PRN

## 2016-08-16 NOTE — Procedures (Signed)
I was present at this session.  I have reviewed the session itself and made appropriate changes.  HD via L fem PC, edema, will cautiously remove vol with pericarditis,low EF.  3rd d in a row HD, for pericarditis.   [name] Small L 10/25/20178:19 AM

## 2016-08-16 NOTE — Progress Notes (Signed)
Subjective: Interval History: has no complaint , cp getting better.  Does not know of plan for new access.  Objective: Vital signs in last 24 hours: Temp:  [98.1 F (36.7 C)-99.5 F (37.5 C)] 98.2 F (36.8 C) (10/25 0640) Pulse Rate:  [54-111] 111 (10/25 0800) Resp:  [15-18] 17 (10/25 0640) BP: (86-134)/(63-98) 128/98 (10/25 0800) SpO2:  [98 %-100 %] 100 % (10/25 0640) Weight:  [54.8 kg (120 lb 13 oz)-56.3 kg (124 lb 1.6 oz)] 55.4 kg (122 lb 2.2 oz) (10/25 0640) Weight change: -4.7 kg (-10 lb 5.8 oz)  Intake/Output from previous day: 10/24 0701 - 10/25 0700 In: 363 [P.O.:360; I.V.:3] Out: 2001  Intake/Output this shift: No intake/output data recorded.  General appearance: alert, cooperative and no distress Resp: diminished breath sounds bilaterally and rales bibasilar Chest wall: has brace on Cardio: S1, S2 normal, systolic murmur: holosystolic 2/6, blowing at apex and friction rub heard throughout precordium GI: at apex 2-3+remities: edema 2-3+ and Lfem cath.  Lab Results:  Recent Labs  08/15/16 0716 08/16/16 0700  WBC 9.1 9.5  HGB 9.0* 9.1*  HCT 28.6* 29.3*  PLT 196 241   BMET:  Recent Labs  08/15/16 0716 08/16/16 0700  NA 134* 140  K 4.2 4.1  CL 97* 101  CO2 23 26  GLUCOSE 76 79  BUN 45* 35*  CREATININE 6.93* 5.07*  CALCIUM 8.0* 9.5   No results for input(s): PTH in the last 72 hours. Iron Studies: No results for input(s): IRON, TIBC, TRANSFERRIN, FERRITIN in the last 72 hours.  Studies/Results: No results found.  I have reviewed the patient's current medications.  Assessment/Plan: 1 ESRD 3rd HD for pericarditis,uremia. Vol xs now, slowly lower with pericarditis 2 Nonadherence a long term issue 3 Anemia esa/Fe 4 HPTH vit D, cinn will discuss with Dr. Gerrit Friends 5 comp fx 6 Access will need long term will set up outpatient 7Pericarditis P Hd, access in future, NSAIDs, Dispo,     LOS: 3 days   [name] Small 08/16/2016,8:20 AM

## 2016-08-16 NOTE — Clinical Social Work Note (Signed)
Clinical Social Worker received information from [facility] regarding potential transportation needs for dialysis.  CSW spoke with patient at bedside who states that she uses [employer] transportation and they pick her up at her home.  Patient lives at home with her boyfriend who is available for intermittent assistance.  Patient states that her mother and friends can be available while her boyfriend is working.  Patient does not express further concerns regarding return home or transportation to Dialysis upon discharge.  Clinical Social Worker will sign off for now as social work intervention is no longer needed. Please consult [place] again if new need arises.  Macario Golds, [place] [phone]

## 2016-08-16 NOTE — Progress Notes (Signed)
OT Cancellation Note   08/16/16 0800  OT Visit Information  Last OT Received On 08/16/16  Reason Eval/Treat Not Completed Patient at procedure or test/ unavailable  Pt at HD. Will attempt later if able.  [facility], OTR/L  [phone] 08/16/2016

## 2016-08-16 NOTE — Progress Notes (Signed)
PT Cancellation Note  Patient Details Name: [name] Small MRN: [id number] DOB: 1991/04/11   Cancelled Treatment:    Reason Eval/Treat Not Completed: Patient at procedure or test/unavailable   Fabio Asa 08/16/2016, 7:27 AM Charlotte Crumb, PT DPT  [phone]

## 2016-08-16 NOTE — Progress Notes (Signed)
PROGRESS NOTE  [name] Small AES:[id number] DOB: Apr 27, 1991 DOA: 08/12/2016 PCP: Jeanann Lewandowsky, MD  Brief History:  25 y.o.femalewith medical history significant of ESRD on dialysis due to childhood polycystic kidney disease, severe systolic EF 12%, AICD, noncompliance, PE comes in with missing dialysis this week. Pt reports that her transportation was not set up at her discharge last week and that is why she missed her sessions all week. Denies fevers. C/o chest pain and back pain. mainly her back pain. She is still having back pain from her recent compression fractures. Pt doing well on RA. Referred for admission for need for dialysis  Assessment/Plan: pulmonary edema and acute on chronic systolic CHF. -patient is HD dependent for volume control and has missed over a week of treatments -3 HD treatments since admission -07/18/16- myoview EF in 12%, patient status post ICD -no further work up anticipated by cardiology at this time -her troponin elevation due to ESRD and demand ischemia -due to low BP not on b-blockers or hydralazine/IMDUR -still appears clinically fluid overloaded  Uremic Pericarditis -improving with HD -06/19/2016 echo EF 20-25%, PA SP 28, moderate MR -cardiology has signed off  ESRD: -renal service consulted -will follow rec's -need aggressive HD therapy  anemia of chronic renal disease  -IV iron and aranesp as per renal discretion -no signs of acute bleeding   secondary hyperparathyroidism -will continue sevelamer, cinacalcet and Calcitrol   depression: -will continue Sertraline   chronic back pain: due to compression fractures (T7-T8-T12) -Continue TLSO brace -continue PRN analgesics  hyperkalemia: t -will monitor electrolytes  -improved with HD  HTN: -stable and well controlled w/o meds -continue HD     Disposition Plan:   Home when cleared by renal Family Communication:  No Family at bedside  Consultants:   Cardiology; nephrology  Code Status:  FULL DVT Prophylaxis:  SCD   Procedures: As Listed in Progress Note Above  Antibiotics: None    Subjective: Patient states that chest pain shortness breath or improving. She states that her back pain is also improving. Denies any fevers, chills, chest pain, shortness breath, nausea, vomiting, diarrhea, abdominal pain.  Objective: Vitals:   08/16/16 1000 08/16/16 1030 08/16/16 1045 08/16/16 1342  BP: (!) 104/94 110/80 114/76 112/87  Pulse: (!) 109 (!) 106 (!) 105 (!) 115  Resp:   18 19  Temp:   98.2 F (36.8 C) 99.1 F (37.3 C)  TempSrc:   Oral Oral  SpO2:   100% 95%  Weight:   52.3 kg (115 lb 4.8 oz)   Height:        Intake/Output Summary (Last 24 hours) at 08/16/16 1845 Last data filed at 08/16/16 1045  Gross per 24 hour  Intake              123 ml  Output             2500 ml  Net            -2377 ml   Weight change: -4.7 kg (-10 lb 5.8 oz) Exam:   General:  Pt is alert, follows commands appropriately, not in acute distress  HEENT: No icterus, No thrush, No neck mass, [place]/AT  Cardiovascular: RRR, S1/S2, no rubs, no gallops  Respiratory: Bibasilar crackles. No wheezing. Good air movement.  Abdomen: Soft/+BS, non tender, non distended, no guarding  Extremities: 1 + LE edema, No lymphangitis, No petechiae, No rashes, no synovitis   Data Reviewed: I have  personally reviewed following labs and imaging studies Basic Metabolic Panel:  Recent Labs Lab 08/12/16 2122 08/13/16 0840 08/14/16 1450 08/15/16 0716 08/16/16 0700  NA 140 138 137 134* 140  K 5.3* 5.4* 6.4* 4.2 4.1  CL 103 104 103 97* 101  CO2 24 20* 20* 23 26  GLUCOSE 72 100* 98 76 79  BUN 84* 89* 103* 45* 35*  CREATININE 10.98* 11.08* 12.32* 6.93* 5.07*  CALCIUM 8.9 9.2 7.7* 8.0* 9.5  PHOS  --   --  7.5* 5.1* 5.5*   Liver Function Tests:  Recent Labs Lab 08/14/16 1450 08/15/16 0716 08/16/16 0700  AST  --  24  --   ALT  --  <5*  --   ALKPHOS  --   259*  --   BILITOT  --  1.3*  --   PROT  --  6.7  --   ALBUMIN 2.9* 2.7* 2.7*   No results for input(s): LIPASE, AMYLASE in the last 168 hours. No results for input(s): AMMONIA in the last 168 hours. Coagulation Profile: No results for input(s): INR, PROTIME in the last 168 hours. CBC:  Recent Labs Lab 08/12/16 2122 08/13/16 0840 08/14/16 1450 08/15/16 0716 08/16/16 0700  WBC 8.4 7.4 9.0 9.1 9.5  HGB 10.6* 10.1* 9.5* 9.0* 9.1*  HCT 33.2* 32.1* 29.9* 28.6* 29.3*  MCV 86.5 87.2 87.4 87.7 87.5  PLT 276 256 240 196 241   Cardiac Enzymes:  Recent Labs Lab 08/13/16 0220 08/13/16 0840 08/13/16 1354  TROPONINI 0.27* 0.25* 0.22*   BNP: Invalid input(s): POCBNP CBG: No results for input(s): GLUCAP in the last 168 hours. HbA1C: No results for input(s): HGBA1C in the last 72 hours. Urine analysis:    Component Value Date/Time   COLORURINE YELLOW 01/14/2013 1639   APPEARANCEUR CLOUDY (A) 01/14/2013 1639   LABSPEC 1.008 01/14/2013 1639   PHURINE 8.0 01/14/2013 1639   GLUCOSEU NEGATIVE 01/14/2013 1639   HGBUR SMALL (A) 01/14/2013 1639   BILIRUBINUR NEGATIVE 01/14/2013 1639   KETONESUR NEGATIVE 01/14/2013 1639   PROTEINUR 30 (A) 01/14/2013 1639   UROBILINOGEN 0.2 01/14/2013 1639   NITRITE NEGATIVE 01/14/2013 1639   LEUKOCYTESUR MODERATE (A) 01/14/2013 1639   Sepsis Labs: @LABRCNTIP (procalcitonin:4,lacticidven:4) )No results found for this or any previous visit (from the past 240 hour(s)).   Scheduled Meds: . calcitonin (salmon)  1 spray Alternating Nares Daily  . calcitRIOL  1 mcg Oral Q M,W,F-HD  . cinacalcet  90 mg Oral Q supper  . darbepoetin (ARANESP) injection - DIALYSIS  100 mcg Intravenous Q Tue-HD  . multivitamin  1 tablet Oral QHS  . naproxen  500 mg Oral BID WC  . sertraline  100 mg Oral Daily  . sevelamer carbonate  2,400 mg Oral TID WC  . sodium chloride flush  3 mL Intravenous Q12H  . sodium chloride flush  3 mL Intravenous Q12H   Continuous  Infusions:   Procedures/Studies: Dg Chest 2 View  Result Date: 08/12/2016 CLINICAL DATA:  Chest pain with dyspnea EXAM: CHEST  2 VIEW COMPARISON:  07/27/2016 thoracic spine CT, chest CT from 06/17/2016 FINDINGS: There is cardiomegaly with bibasilar atelectasis and right small pleural effusion. The aorta is not aneurysmal. Right-sided AICD device with right ventricular lead is again seen which appears intact. There is 50% compression of what may represent the T12 vertebral body when using the apex of a pre-existing IVC filter to determine levels based on the filter location on prior CT. The T7 compression fracture is partially obscured by  the patient's chair vascular clips project over the visualized distal right arm and elbow. IMPRESSION: Stable cardiomegaly. Bibasilar atelectasis with small right pleural effusion. There appears to be a 50% compression fracture of what may represent T12, new since previous thoracic spine CT from 07/27/2016. Electronically Signed   By: Tollie Eth M.D.   On: 08/12/2016 22:38   Dg Chest 2 View  Result Date: 07/26/2016 CLINICAL DATA:  Shortness of breath and chest pain with history of recent rib fractures EXAM: CHEST  2 VIEW COMPARISON:  07/23/2016 FINDINGS: Cardiac shadow remains enlarged. A defibrillator is again seen. The lungs are well aerated bilaterally. Previously seen left basilar changes have improved in the interval from the prior exam. No sizable effusion is seen. No acute bony abnormality is noted. IMPRESSION: Improved aeration in the left lung base when compare with the prior exam. No new focal abnormality is seen. Electronically Signed   By: Alcide Clever M.D.   On: 07/26/2016 18:53   Dg Ribs Unilateral W/chest Right  Result Date: 07/23/2016 CLINICAL DATA:  Fall at home with right rib pain. Initial encounter. EXAM: RIGHT RIBS AND CHEST - 3+ VIEW COMPARISON:  07/01/2016 FINDINGS: Single chamber pacer/ICD battery pack obscures some of the right ribs. A marker  is present more inferiorly and these levels are negative for acute fracture. No hemothorax or pneumothorax on the right. There is infrahilar opacity on the left with small pleural effusion. Chronic marked cardiopericardial enlargement and pulmonary venous congestion. IVC filter. IMPRESSION: 1. Negative for fracture along the symptomatic lower right ribs. No pneumothorax. 2. Left lower lobe atelectasis or pneumonia with small pleural effusion. 3. Chronic marked cardiomegaly and pulmonary venous congestion. Electronically Signed   By: Marnee Spring M.D.   On: 07/23/2016 21:53   Dg Thoracic Spine 2 View  Result Date: 07/24/2016 CLINICAL DATA:  25 y/o  F; fall at home with severe mid back pain. EXAM: THORACIC SPINE 2 VIEWS COMPARISON:  06/17/2016 CT of the chest. FINDINGS: Single lead AICD. Mild S-shaped curvature of thoracic spine. IVC filter. Mild anterior loss of height of a mid thoracic vertebral body, possibly T8, appears new from prior CT. IMPRESSION: Suspected new mild anterior compression deformity of T8. Confirmation with thoracic CT or MRI is recommended. These results will be called to the ordering clinician or representative by the Radiologist Assistant, and communication documented in the PACS or zVision Dashboard. Electronically Signed   By: Mitzi Hansen M.D.   On: 07/24/2016 00:38   Ct Thoracic Spine Wo Contrast  Result Date: 07/27/2016 CLINICAL DATA:  25 year old female with progressive upper back pain and shortness of breath after falling. EXAM: CT THORACIC SPINE WITHOUT CONTRAST TECHNIQUE: Multidetector CT imaging of the thoracic spine was performed without intravenous contrast administration. Multiplanar CT image reconstructions were also generated. COMPARISON:  Prior chest CT 06/17/2016. Prior CT scan of the abdomen and pelvis 10/26/2015 FINDINGS: Alignment: Slightly exaggerated thoracic kyphosis. No scoliotic curvature. Vertebrae: Acute nondisplaced fracture through the  posterior aspect of the right fifth rib. Subtle compression fracture of the anterior aspect of T7 with buckling of the cortex. There is approximately 18- 19% height loss anteriorly. No posterior retropulsion. Findings are new compared to prior chest CT 06/17/2016. Very subtle probable compression deformity of the superior endplate of T8 without height loss. Additionally, there is subtle buckling of the anterior cortex at T12 which is less specific but new compared to October 26, 2015. No associated height loss. Diffuse demineralization of the bones suggests osteoporosis. Paraspinal and  other soft tissues: No paraspinal mass or hematoma. Disc levels: Moderately large right pleural effusion. Incompletely imaged right subclavian approach intracardiac defibrillator. Cardiomegaly. Moderate pericardial effusion. Incompletely imaged IVC filter. Numerous bilateral renal cysts and calcifications of varying complexity consistent with the clinical history of polycystic kidney disease. IMPRESSION: 1. Acute to subacute fracture of T7 with 18-19% anterior height loss. No evidence of posterior retropulsion. 2. Suspect subtle compression deformity of the superior endplate of T8 without associated height loss. 3. Similarly, probable acute to subacute fracture involving the anterior cortex of T12 without evidence of associated height loss. This finding is new compared to October 26, 2015 and has likely occurred at some point since that time. 4. Diffusely abnormal bone and marrow density with areas of relatively increased sclerosis and osteopenia. Findings may represent aggressive osteopenia, or renal osteodystrophy. 5. Moderately large right pleural effusion without significant interval change compared 06/17/2016. 6. Nondisplaced fracture the posterior aspect of the right fifth rib. 7. Cardiomegaly. 8. Moderate pericardial effusion. 9. Numerous renal cysts of varying complexity bilaterally. Electronically Signed   By: Malachy MoanHeath  McCullough  M.D.   On: 07/27/2016 10:22    Judye Lorino, DO  [facility] Pager [phone]  If 7PM-7AM, please contact night-coverage [email or web address] Password [email or web address] 08/16/2016, 6:45 PM   LOS: 3 days

## 2016-08-17 ENCOUNTER — Other Ambulatory Visit: Payer: Self-pay | Admitting: Internal Medicine

## 2016-08-17 ENCOUNTER — Telehealth: Payer: Self-pay

## 2016-08-17 DIAGNOSIS — F411 Generalized anxiety disorder: Secondary | ICD-10-CM

## 2016-08-17 DIAGNOSIS — Z95810 Presence of automatic (implantable) cardiac defibrillator: Secondary | ICD-10-CM

## 2016-08-17 DIAGNOSIS — N186 End stage renal disease: Secondary | ICD-10-CM

## 2016-08-17 DIAGNOSIS — Z992 Dependence on renal dialysis: Secondary | ICD-10-CM

## 2016-08-17 DIAGNOSIS — R748 Abnormal levels of other serum enzymes: Secondary | ICD-10-CM

## 2016-08-17 DIAGNOSIS — I5023 Acute on chronic systolic (congestive) heart failure: Secondary | ICD-10-CM

## 2016-08-17 DIAGNOSIS — S22000A Wedge compression fracture of unspecified thoracic vertebra, initial encounter for closed fracture: Secondary | ICD-10-CM

## 2016-08-17 MED ORDER — LORAZEPAM 1 MG PO TABS: 1.0000 mg | ORAL_TABLET | Freq: Every day | ORAL | 0 refills | Status: DC | PRN

## 2016-08-17 NOTE — Progress Notes (Signed)
Rehab Admissions Coordinator Note:  Patient was screened by Trish Mage for appropriateness for an Inpatient Acute Rehab Consult.  Noted PT recommending CIR.  At this time, we are recommending Inpatient Rehab consult.  Trish Mage 08/17/2016, 12:02 PM  I can be reached at [phone].

## 2016-08-17 NOTE — Care Management Important Message (Signed)
Important Message  Patient Details  Name: Vaanya Alvord MRN: [id number] Date of Birth: [birth year]   Medicare Important Message Given:  Yes    Money Mckeithan Abena 08/17/2016, 9:24 AM

## 2016-08-17 NOTE — Progress Notes (Signed)
PROGRESS NOTE  [name] Small ZOX:[id number] DOB: 12-17-1990 DOA: 08/12/2016 PCP: Jeanann LewandowskyJEGEDE, OLUGBEMIGA, MD  Brief History:   25 y.o.femalewith medical history significant of ESRD on dialysis due to childhood polycystic kidney disease, severe systolic EF 12%, AICD, noncompliance, PE comes in with missing dialysis this week. Pt reports that her transportation was not set up at her discharge last week and that is why she missed her sessions all week. Denies fevers. C/o chest pain and back pain. mainly her back pain. She is still having back pain from her recent compression fractures. Pt doing well on RA. Referred for admission for need for dialysis  Assessment/Plan: pulmonary edema and acute on chronic systolic CHF. -patient is HD dependent for volume control and has missed over a week of treatments -3 HD treatments since admission -07/18/16- myoview EF in 12%, patient status post ICD -no further work up anticipated by cardiology at this time -her troponin elevation due to ESRD and demand ischemia -due to low BP not on b-blockers or hydralazine/IMDUR  Uremic Pericarditis -improving with HD -06/19/2016 echo EF 20-25%, PA SP 28, moderate MR -cardiology has signed off  ESRD: -renal service consulted -need aggressive HD therapy  anemia of chronic renal disease  -IV iron and aranesp as per renal discretion -no signs of acute bleeding   secondary hyperparathyroidism -will continue sevelamer, cinacalcet and Calcitrol   depression: -will continue Sertraline   chronic back pain: due to compression fractures (T7-T8-T12) -Continue TLSO brace -continue PRN analgesics  hyperkalemia:  -improved with HD  HTN: -stable and well controlled w/o meds -continue HD     Disposition Plan:   CIR 08/17/16 if stable Family Communication:  No Family at bedside  Consultants:  Cardiology; nephrology  Code Status:  FULL DVT Prophylaxis:   SCD   Procedures: As Listed in Progress Note Above  Antibiotics: None     Subjective: Patient denies fevers, chills, headache, chest pain, dyspnea, nausea, vomiting, diarrhea, abdominal pain, dysuria, hematuria, hematochezia, and melena.   Objective: Vitals:   08/17/16 0125 08/17/16 0500 08/17/16 0846 08/17/16 1332  BP:  108/89 (!) 122/95 (!) 121/100  Pulse:  (!) 103 (!) 106 96  Resp: 20 16 16 16   Temp:  98.6 F (37 C) 98.7 F (37.1 C) 98.3 F (36.8 C)  TempSrc:  Oral Oral Oral  SpO2:  99% 100% 98%  Weight:      Height:        Intake/Output Summary (Last 24 hours) at 08/17/16 1859 Last data filed at 08/16/16 2249  Gross per 24 hour  Intake              120 ml  Output                1 ml  Net              119 ml   Weight change: -4.4 kg (-9 lb 11.2 oz) Exam:   General:  Pt is alert, follows commands appropriately, not in acute distress  HEENT: No icterus, No thrush, No neck mass, [place]/AT  Cardiovascular: RRR, S1/S2, no rubs, no gallops  Respiratory: CTA bilaterally, no wheezing, no crackles, no rhonchi  Abdomen: Soft/+BS, non tender, non distended, no guarding  Extremities: trace LE edema, No lymphangitis, No petechiae, No rashes, no synovitis   Data Reviewed: I have personally reviewed following labs and imaging studies Basic Metabolic Panel:  Recent Labs Lab 08/12/16 2122 08/13/16 0840 08/14/16 1450 08/15/16  0716 08/16/16 0700  NA 140 138 137 134* 140  K 5.3* 5.4* 6.4* 4.2 4.1  CL 103 104 103 97* 101  CO2 24 20* 20* 23 26  GLUCOSE 72 100* 98 76 79  BUN 84* 89* 103* 45* 35*  CREATININE 10.98* 11.08* 12.32* 6.93* 5.07*  CALCIUM 8.9 9.2 7.7* 8.0* 9.5  PHOS  --   --  7.5* 5.1* 5.5*   Liver Function Tests:  Recent Labs Lab 08/14/16 1450 08/15/16 0716 08/16/16 0700  AST  --  24  --   ALT  --  <5*  --   ALKPHOS  --  259*  --   BILITOT  --  1.3*  --   PROT  --  6.7  --   ALBUMIN 2.9* 2.7* 2.7*   No results for input(s): LIPASE,  AMYLASE in the last 168 hours. No results for input(s): AMMONIA in the last 168 hours. Coagulation Profile: No results for input(s): INR, PROTIME in the last 168 hours. CBC:  Recent Labs Lab 08/12/16 2122 08/13/16 0840 08/14/16 1450 08/15/16 0716 08/16/16 0700  WBC 8.4 7.4 9.0 9.1 9.5  HGB 10.6* 10.1* 9.5* 9.0* 9.1*  HCT 33.2* 32.1* 29.9* 28.6* 29.3*  MCV 86.5 87.2 87.4 87.7 87.5  PLT 276 256 240 196 241   Cardiac Enzymes:  Recent Labs Lab 08/13/16 0220 08/13/16 0840 08/13/16 1354  TROPONINI 0.27* 0.25* 0.22*   BNP: Invalid input(s): POCBNP CBG: No results for input(s): GLUCAP in the last 168 hours. HbA1C: No results for input(s): HGBA1C in the last 72 hours. Urine analysis:    Component Value Date/Time   COLORURINE YELLOW 01/14/2013 1639   APPEARANCEUR CLOUDY (A) 01/14/2013 1639   LABSPEC 1.008 01/14/2013 1639   PHURINE 8.0 01/14/2013 1639   GLUCOSEU NEGATIVE 01/14/2013 1639   HGBUR SMALL (A) 01/14/2013 1639   BILIRUBINUR NEGATIVE 01/14/2013 1639   KETONESUR NEGATIVE 01/14/2013 1639   PROTEINUR 30 (A) 01/14/2013 1639   UROBILINOGEN 0.2 01/14/2013 1639   NITRITE NEGATIVE 01/14/2013 1639   LEUKOCYTESUR MODERATE (A) 01/14/2013 1639   Sepsis Labs: @LABRCNTIP (procalcitonin:4,lacticidven:4) )No results found for this or any previous visit (from the past 240 hour(s)).   Scheduled Meds: . calcitonin (salmon)  1 spray Alternating Nares Daily  . calcitRIOL  1 mcg Oral Q M,W,F-HD  . cinacalcet  90 mg Oral Q supper  . darbepoetin (ARANESP) injection - DIALYSIS  100 mcg Intravenous Q Tue-HD  . multivitamin  1 tablet Oral QHS  . naproxen  500 mg Oral BID WC  . sertraline  100 mg Oral Daily  . sevelamer carbonate  2,400 mg Oral TID WC  . sodium chloride flush  3 mL Intravenous Q12H  . sodium chloride flush  3 mL Intravenous Q12H   Continuous Infusions:   Procedures/Studies: Dg Chest 2 View  Result Date: 08/12/2016 CLINICAL DATA:  Chest pain with dyspnea EXAM:  CHEST  2 VIEW COMPARISON:  07/27/2016 thoracic spine CT, chest CT from 06/17/2016 FINDINGS: There is cardiomegaly with bibasilar atelectasis and right small pleural effusion. The aorta is not aneurysmal. Right-sided AICD device with right ventricular lead is again seen which appears intact. There is 50% compression of what may represent the T12 vertebral body when using the apex of a pre-existing IVC filter to determine levels based on the filter location on prior CT. The T7 compression fracture is partially obscured by the patient's chair vascular clips project over the visualized distal right arm and elbow. IMPRESSION: Stable cardiomegaly. Bibasilar atelectasis with small  right pleural effusion. There appears to be a 50% compression fracture of what may represent T12, new since previous thoracic spine CT from 07/27/2016. Electronically Signed   By: Tollie Eth M.D.   On: 08/12/2016 22:38   Dg Chest 2 View  Result Date: 07/26/2016 CLINICAL DATA:  Shortness of breath and chest pain with history of recent rib fractures EXAM: CHEST  2 VIEW COMPARISON:  07/23/2016 FINDINGS: Cardiac shadow remains enlarged. A defibrillator is again seen. The lungs are well aerated bilaterally. Previously seen left basilar changes have improved in the interval from the prior exam. No sizable effusion is seen. No acute bony abnormality is noted. IMPRESSION: Improved aeration in the left lung base when compare with the prior exam. No new focal abnormality is seen. Electronically Signed   By: Alcide Clever M.D.   On: 07/26/2016 18:53   Dg Ribs Unilateral W/chest Right  Result Date: 07/23/2016 CLINICAL DATA:  Fall at home with right rib pain. Initial encounter. EXAM: RIGHT RIBS AND CHEST - 3+ VIEW COMPARISON:  07/01/2016 FINDINGS: Single chamber pacer/ICD battery pack obscures some of the right ribs. A marker is present more inferiorly and these levels are negative for acute fracture. No hemothorax or pneumothorax on the right. There  is infrahilar opacity on the left with small pleural effusion. Chronic marked cardiopericardial enlargement and pulmonary venous congestion. IVC filter. IMPRESSION: 1. Negative for fracture along the symptomatic lower right ribs. No pneumothorax. 2. Left lower lobe atelectasis or pneumonia with small pleural effusion. 3. Chronic marked cardiomegaly and pulmonary venous congestion. Electronically Signed   By: Marnee Spring M.D.   On: 07/23/2016 21:53   Dg Thoracic Spine 2 View  Result Date: 07/24/2016 CLINICAL DATA:  25 y/o  F; fall at home with severe mid back pain. EXAM: THORACIC SPINE 2 VIEWS COMPARISON:  06/17/2016 CT of the chest. FINDINGS: Single lead AICD. Mild S-shaped curvature of thoracic spine. IVC filter. Mild anterior loss of height of a mid thoracic vertebral body, possibly T8, appears new from prior CT. IMPRESSION: Suspected new mild anterior compression deformity of T8. Confirmation with thoracic CT or MRI is recommended. These results will be called to the ordering clinician or representative by the Radiologist Assistant, and communication documented in the PACS or zVision Dashboard. Electronically Signed   By: Mitzi Hansen M.D.   On: 07/24/2016 00:38   Ct Thoracic Spine Wo Contrast  Result Date: 07/27/2016 CLINICAL DATA:  25 year old female with progressive upper back pain and shortness of breath after falling. EXAM: CT THORACIC SPINE WITHOUT CONTRAST TECHNIQUE: Multidetector CT imaging of the thoracic spine was performed without intravenous contrast administration. Multiplanar CT image reconstructions were also generated. COMPARISON:  Prior chest CT 06/17/2016. Prior CT scan of the abdomen and pelvis 10/26/2015 FINDINGS: Alignment: Slightly exaggerated thoracic kyphosis. No scoliotic curvature. Vertebrae: Acute nondisplaced fracture through the posterior aspect of the right fifth rib. Subtle compression fracture of the anterior aspect of T7 with buckling of the cortex. There  is approximately 18- 19% height loss anteriorly. No posterior retropulsion. Findings are new compared to prior chest CT 06/17/2016. Very subtle probable compression deformity of the superior endplate of T8 without height loss. Additionally, there is subtle buckling of the anterior cortex at T12 which is less specific but new compared to October 26, 2015. No associated height loss. Diffuse demineralization of the bones suggests osteoporosis. Paraspinal and other soft tissues: No paraspinal mass or hematoma. Disc levels: Moderately large right pleural effusion. Incompletely imaged right subclavian approach intracardiac  defibrillator. Cardiomegaly. Moderate pericardial effusion. Incompletely imaged IVC filter. Numerous bilateral renal cysts and calcifications of varying complexity consistent with the clinical history of polycystic kidney disease. IMPRESSION: 1. Acute to subacute fracture of T7 with 18-19% anterior height loss. No evidence of posterior retropulsion. 2. Suspect subtle compression deformity of the superior endplate of T8 without associated height loss. 3. Similarly, probable acute to subacute fracture involving the anterior cortex of T12 without evidence of associated height loss. This finding is new compared to October 26, 2015 and has likely occurred at some point since that time. 4. Diffusely abnormal bone and marrow density with areas of relatively increased sclerosis and osteopenia. Findings may represent aggressive osteopenia, or renal osteodystrophy. 5. Moderately large right pleural effusion without significant interval change compared 06/17/2016. 6. Nondisplaced fracture the posterior aspect of the right fifth rib. 7. Cardiomegaly. 8. Moderate pericardial effusion. 9. Numerous renal cysts of varying complexity bilaterally. Electronically Signed   By: Malachy Moan M.D.   On: 07/27/2016 10:22    Davonne Jarnigan, DO  [facility] Pager [phone]  If 7PM-7AM, please contact  night-coverage [email or web address] Password [email or web address] 08/17/2016, 6:59 PM   LOS: 4 days

## 2016-08-17 NOTE — Progress Notes (Signed)
OT Cancellation Note  Patient Details Name: Shaynna Laban MRN: [id number] DOB: [birth year]   Cancelled Treatment:    Reason Eval/Treat Not Completed: Pain limiting ability to participate  Pt in 6/7 level of pain and cant have pain meds for 2 more hours. Pt is motivated to participate with OT but not at this time. Will recheck on pt as pain more tolerable   Khyre Germond, [place]ung 08/17/2016, 1:39 PM

## 2016-08-17 NOTE — Evaluation (Signed)
Physical Therapy Evaluation Patient Details Name: [name] Small MRN: [id number] DOB: 08/18/1991 Today's Date: 08/17/2016   History of Present Illness  Patient is a 25 y/o female with hx of recent fall and admission 10/4 with T7,8,12 fx, acute pulmonary edema, ESRD on HD due to childhood polycystic kidney disease, NICM, EF 12-20%, AICD, HTN, depression, anxiety, pelvic fx, anemia, CHF and sickle cell presents due to missing dialysis this past week. and reports chest pain and back pain.   Clinical Impression  Patient presents with pain, generalized weakness, impaired sensation/tingling/burning BLEs and impaired mobility s/p above. Pt continues to wear TLSO due to spinal fx's. Pt has required assist with ADLs and uses RW since fall a few weeks ago. Pt motivated to return to independence. Tolerated short distance ambulation with Min A for balance/safety due to bil knee instability and fatigue. Pt has support of b/f and parents in area but they work during the day. Would benefit from CIR to maximize independence and mobility prior to return home. Will follow acutely.     Follow Up Recommendations CIR    Equipment Recommendations  None recommended by PT    Recommendations for Other Services OT consult;Rehab consult     Precautions / Restrictions Precautions Precautions: Fall;Back Precaution Booklet Issued: No Required Braces or Orthoses: Spinal Brace Spinal Brace: Thoracolumbosacral orthotic Restrictions Weight Bearing Restrictions: No      Mobility  Bed Mobility Overal bed mobility: Needs Assistance Bed Mobility: Rolling;Sidelying to Sit Rolling: Supervision Sidelying to sit: Supervision;HOB elevated       General bed mobility comments: Cues for log roll technique, use of rail for support. Able to adjust brace.   Transfers Overall transfer level: Needs assistance Equipment used: Rolling walker (2 wheeled) Transfers: Sit to/from Stand Sit to Stand: Min assist          General transfer comment: Assist to stand from EOB with cues for hand placement/technique. Pain through feet. LOB upon standing posteriorly.  Ambulation/Gait Ambulation/Gait assistance: Min assist Ambulation Distance (Feet): 20 Feet Assistive device: Rolling walker (2 wheeled) Gait Pattern/deviations: Step-through pattern;Decreased stride length;Trunk flexed;Narrow base of support Gait velocity: decreased Gait velocity interpretation: Below normal speed for age/gender General Gait Details: Slow, unsteady gait with bil knee instability. Assist for balance/safety. Trembling in BLEs noted. Cues for breathing. Fatigues through UEs.  Stairs            Wheelchair Mobility    Modified Rankin (Stroke Patients Only)       Balance Overall balance assessment: Needs assistance Sitting-balance support: Feet supported;No upper extremity supported Sitting balance-Leahy Scale: Fair Sitting balance - Comments: Able to adjust brace sitting EOB without difficulty.    Standing balance support: During functional activity;Bilateral upper extremity supported Standing balance-Leahy Scale: Poor Standing balance comment: Reliant on BUEs for support and close Min guard with 1 instance of posterior LOB.                             Pertinent Vitals/Pain Pain Assessment: 0-10 Pain Score: 7  Pain Location: bil feet and back Pain Descriptors / Indicators: Sore;Aching;Burning Pain Intervention(s): Monitored during session;Repositioned;Premedicated before session;Limited activity within patient's tolerance    Home Living Family/patient expects to be discharged to:: Unsure Living Arrangements: Parent Available Help at Discharge: Family;Available PRN/intermittently (bf and parents work during the day) Type of Home: Apartment Home Access: Level entry     Home Layout: One level Home Equipment: Walker - 2 wheels;Bedside commode  Prior Function Level of Independence: Independent with  assistive device(s);Needs assistance   Gait / Transfers Assistance Needed: Requires use of RW for ambulation. Reports her dad had to carry her up the stairs to get into their home.  ADL's / Homemaking Assistance Needed: Since fall and spinal fx, assist needed with ADLs from b/f. Not able to perform IADLs.  Comments: Patient uses public transportation to HD.       Hand Dominance        Extremity/Trunk Assessment   Upper Extremity Assessment: Defer to OT evaluation;Generalized weakness           Lower Extremity Assessment: Generalized weakness;RLE deficits/detail;LLE deficits/detail (difficult to assess due to pain but trembling noted during standing and functional tasks.)         Communication   Communication: No difficulties  Cognition Arousal/Alertness: Awake/alert Behavior During Therapy: WFL for tasks assessed/performed Overall Cognitive Status: Within Functional Limits for tasks assessed                      General Comments General comments (skin integrity, edema, etc.): Pt seems very motivated to maximize independence and very interested in CIR.    Exercises     Assessment/Plan    PT Assessment Patient needs continued PT services  PT Problem List Decreased strength;Decreased mobility;Decreased activity tolerance;Pain;Impaired sensation;Decreased balance;Decreased knowledge of use of DME          PT Treatment Interventions DME instruction;Therapeutic activities;Patient/family education;Therapeutic exercise;Gait training;Stair training;Neuromuscular re-education;Functional mobility training;Balance training    PT Goals (Current goals can be found in the Care Plan section)  Acute Rehab PT Goals Patient Stated Goal: to be able to return to independence PT Goal Formulation: With patient Time For Goal Achievement: 08/31/16 Potential to Achieve Goals: Good    Frequency Min 3X/week   Barriers to discharge Decreased caregiver support home alone during  the day    Co-evaluation               End of Session Equipment Utilized During Treatment: Back brace Activity Tolerance: Patient tolerated treatment well;Patient limited by fatigue Patient left: in bed;with call bell/phone within reach;with bed alarm set (sitting EOB.) Nurse Communication: Mobility status         Time: [id number] PT Time Calculation (min) (ACUTE ONLY): 23 min   Charges:   PT Evaluation $PT Eval Moderate Complexity: 1 Procedure PT Treatments $Gait Training: 8-22 mins   PT G Codes:        Nakira Litzau A Karmello Abercrombie 08/17/2016, 11:34 AM Mylo RedShauna Leaann Nevils, PT, DPT [phone]

## 2016-08-17 NOTE — Telephone Encounter (Signed)
I refilled the Ativan, Oxycodone is not due for refill and may not be refilled

## 2016-08-17 NOTE — Consult Note (Signed)
Physical Medicine and Rehabilitation Consult Reason for Consult: Debilitation related to recent fall T7, 8, 12 fracture/multi-medical Referring Physician: Dr. Arbutus Leas   HPI: [name] Small is a 25 y.o. right handed female with history significant for end-stage renal disease with hemodialysis due to childhood polycystic kidney disease, severe systolic ejection fraction of 16%, AICD, medical noncompliance and narcotic abuse, pulmonary emboli. Per chart review patient lives alone. Independent with assistive device prior to admission. One level home. She was needing some assistance with ADLs since recent fall. Boyfriend and parents work during the day. Patient with reported recent fall admitted 07/26/2016-08/04/2016 sustaining T7 fracture 18-19 percent, subtle compression deformity superior endplate of T8, probable acute subacute fracture T12. Patient receive follow-up by Dr.  Shon Baton of orthopedic services. Placed in a TLSO back brace. Discharge to home ambulating with assistive device and home health therapies recommended. Presented 08/13/2016 after missing several recent dialysis treatments due to reported transportation issues. Complaints of increased back pain. Chest x-ray showed basilar atelectasis with small right pleural effusion. Appeared to be a 50% compression fracture of what may represent T12 new since previous thoracic spine CT from 07/27/2016. Findings of fluid overload with follow-up cardiology services received emergent dialysis. Elevated troponin 0.2 felt to be demand ischemia. Follow-up renal services for ongoing dialysis. Physical therapy evaluation completed 08/17/2016 with recommendations of physical medicine rehabilitation consult.  Patient has been walking with a walker since she suffered a pelvic fracture, pubic ramus on the right side in 2016 Patient does get some shortness of breath with ambulation and reports poor endurance  She lives by herself but has a boyfriend, in on the  weekends. He works out of town. Her parents can stop by after work.  Review of Systems  Constitutional: Negative for chills and fever.  HENT: Negative for hearing loss.   Respiratory: Positive for shortness of breath. Negative for cough.   Cardiovascular: Positive for palpitations and leg swelling.  Gastrointestinal: Positive for constipation. Negative for nausea and vomiting.       GERD  Musculoskeletal: Positive for joint pain and myalgias.  Skin: Negative for rash.  Neurological: Positive for headaches. Negative for dizziness and tingling.  Psychiatric/Behavioral: Positive for depression.       Anxiety  All other systems reviewed and are negative.  Past Medical History:  Diagnosis Date  . 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 Anemia   . Anxiety   . Cardiomyopathy   . Cellulitis and abscess of face 03/22/2013  . CHF (congestive heart failure) (HCC)   . Chronic anticoagulation   . Chronic pain   . Depression   . DVT (deep venous thrombosis) (HCC) ~ 2014   BLE  . Dysrhythmia    at times per pt.  . 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 ESRD (end stage renal disease) on dialysis [facility])    "MWF; [facility]" (08/15/2016)  . Fracture, thoracic vertebra (HCC) 07/2016   "T7-T12"  . GERD (gastroesophageal reflux disease)   . H/O transfusion of packed red blood cells   . Heart murmur   . Hemodialysis patient (HCC)    Mon. Wed. Fri  . Hyperkalemia 09/2015  . Hypertension   . Narcotic abuse, continuous   . Osteoporosis   . Pelvic fracture (HCC) 06/2015   "on the right"  . Polycystic kidney disease   . Pulmonary emboli (HCC)  01/2012   Bilateral, moderate clot burden, areas of pulmonary infarction and central necrosis  . Renal insufficiency   . Sickle cell anemia (HCC)    Past Surgical History:  Procedure Laterality  Date  . AV FISTULA PLACEMENT Bilateral    "right side stopped working & never had it revised" (04/11/2016)  . AV FISTULA PLACEMENT Right 08/01/2016   Procedure: INSERTION OF ARTERIOVENOUS (AV) GORE-TEX GRAFT RIGHT ARM;  Surgeon: Chuck Hint, MD;  Location: [facility] OR;  Service: Vascular;  Laterality: Right;  . AVGG REMOVAL Right 08/03/2016   Procedure: REMOVAL OF ARTERIOVENOUS GORETEX GRAFT (AVGG);  Surgeon: Maeola Harman, MD;  Location: [facility] OR;  Service: Vascular;  Laterality: Right;  . CARDIAC CATHETERIZATION    . IMPLANTABLE CARDIOVERTER DEFIBRILLATOR IMPLANT Right 12/2014  . INCISION AND DRAINAGE ABSCESS Right 03/21/2013   Procedure: INCISION AND DRAINAGE RIGHT CHEEK ABSCESS REMOVAL OF FOREIGN BODY;  Surgeon: Serena Colonel, MD;  Location: [facility] OR;  Service: ENT;  Laterality: Right;  . INSERTION OF DIALYSIS CATHETER Right Mar 09, 2016   thigh, Dr. Wyn Quaker, [facility]  . 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 NEPHRECTOMY Right     kidney placed in 2008, and body rejected in 2012   . PERIPHERAL VASCULAR CATHETERIZATION Left 03/09/2016   Procedure: A/V Shuntogram/Fistulagram;  Surgeon: Annice Needy, MD;  Location: [facility] INVASIVE CV LAB;  Service: Cardiovascular;  Laterality: Left;  . PERIPHERAL VASCULAR CATHETERIZATION N/A 05/09/2016   Procedure: Dialysis/Perma Catheter Removal;  Surgeon: Renford Dills, MD;  Location: [facility] INVASIVE CV LAB;  Service: Cardiovascular;  Laterality: N/A;  . PERIPHERAL VASCULAR CATHETERIZATION N/A 07/12/2016   Procedure: Dialysis/Perma Catheter Insertion;  Surgeon: Annice Needy, MD;  Location: [facility] INVASIVE CV LAB;  Service: Cardiovascular;  Laterality: N/A;  . PERIPHERAL VASCULAR CATHETERIZATION Right 07/31/2016   Procedure: Upper Extremity Venography;  Surgeon: Maeola Harman, MD;  Location: [facility] INVASIVE CV LAB;  Service: Cardiovascular;  Laterality: Right;  . REVISON OF ARTERIOVENOUS FISTULA Left 03/22/2016   Procedure: REVISON OF ARTERIOVENOUS FISTULA (  ARTEGRAFT );  Surgeon: Annice Needy, MD;  Location: [facility] ORS;  Service: Vascular;  Laterality: Left;  . TONSILLECTOMY AND ADENOIDECTOMY  ~ 2000   Family History  Problem Relation Age of Onset  . Polycystic kidney disease Father   . Hypertension Father    Social History:  reports that she quit smoking about 8 months ago. Her smoking use included Cigarettes. She smoked 0.00 packs per day for 1.00 year. She has never used smokeless tobacco. She reports that she does not drink alcohol or use drugs. Allergies:  Allergies  Allergen Reactions  . Aspirin Other (See Comments)    Interacts with Coreg  . Morphine And Related Itching and Other (See Comments)    Ok with oxycodone   . Tramadol Anaphylaxis  . Vicodin [Hydrocodone-Acetaminophen] Hives  . Buprenorphine Hcl Itching and Other (See Comments)    Ok with oxycodone  . Iohexol Itching   Medications Prior to Admission  Medication Sig Dispense Refill  . calcitonin, salmon, (MIACALCIN) 200 UNIT/ACT nasal spray Place 1 spray into alternate nostrils daily. 3.7 mL 0  . calcitRIOL (ROCALTROL) 0.5 MCG capsule Take 1 capsule (0.5 mcg total) by mouth every Monday, Wednesday, and Friday with hemodialysis. 30 capsule 0  . cinacalcet (SENSIPAR) 30 MG tablet Take 1 tablet (30 mg total) by mouth 2 (two) times daily with a meal. 60 tablet 0  . colchicine 0.6 MG tablet Take 0.5 tablet (0.3 mg  total) by mouth two times per week. (Patient taking differently: Take 0.6 mg by mouth 2 (two) times a week. ) 8 tablet 3  . famotidine (PEPCID) 20 MG tablet Take 1 tablet (20 mg total) by mouth 2 (two) times daily as needed for heartburn or indigestion. 60 tablet 0  . LORazepam (ATIVAN) 1 MG tablet Take 1 tablet (1 mg total) by mouth daily as needed for anxiety. (Patient taking differently: Take 1 mg by mouth every morning. ) 30 tablet 0  . multivitamin (RENA-VIT) TABS tablet Take 1 tablet by mouth daily. 90 tablet 3  . naproxen (NAPROSYN) 500 MG tablet Take 1 tablet  (500 mg total) by mouth 2 (two) times daily with a meal. 60 tablet 0  . oxyCODONE (ROXICODONE) 15 MG immediate release tablet Take 1 tablet (15 mg total) by mouth every 6 (six) hours as needed. You must try to cut back on the frequency of these pills as your pain improves 30 tablet 0  . sertraline (ZOLOFT) 100 MG tablet Take 1 tablet (100 mg total) by mouth daily. 30 tablet 3  . sevelamer carbonate (RENVELA) 800 MG tablet Take 2-3 tablets (1,600-2,400 mg total) by mouth 3 (three) times daily with meals. 3 tabs with meals, 2 tabs with snacks (Patient taking differently: Take 1,600-2,400 mg by mouth See admin instructions. 2,400 mg three times a day with each meal and 1,600 mg with snacks) 120 tablet 3    Home: Home Living Family/patient expects to be discharged to:: Unsure Living Arrangements: Parent Available Help at Discharge: Family, Available PRN/intermittently (bf and parents work during the day) Type of Home: Apartment Home Access: Level entry Home Layout: One level Home Equipment: Environmental consultantWalker - 2 wheels, Bedside commode  Functional History: Prior Function Level of Independence: Independent with assistive device(s), Needs assistance Gait / Transfers Assistance Needed: Requires use of RW for ambulation. Reports her dad had to carry her up the stairs to get into their home. ADL's / Homemaking Assistance Needed: Since fall and spinal fx, assist needed with ADLs from b/f. Not able to perform IADLs. Comments: Patient uses public transportation to HD.   Functional Status:  Mobility: Bed Mobility Overal bed mobility: Needs Assistance Bed Mobility: Rolling, Sidelying to Sit Rolling: Supervision Sidelying to sit: Supervision, HOB elevated General bed mobility comments: Cues for log roll technique, use of rail for support. Able to adjust brace.  Transfers Overall transfer level: Needs assistance Equipment used: Rolling walker (2 wheeled) Transfers: Sit to/from Stand Sit to Stand: Min  assist General transfer comment: Assist to stand from EOB with cues for hand placement/technique. Pain through feet. LOB upon standing posteriorly. Ambulation/Gait Ambulation/Gait assistance: Min assist Ambulation Distance (Feet): 20 Feet Assistive device: Rolling walker (2 wheeled) Gait Pattern/deviations: Step-through pattern, Decreased stride length, Trunk flexed, Narrow base of support General Gait Details: Slow, unsteady gait with bil knee instability. Assist for balance/safety. Trembling in BLEs noted. Cues for breathing. Fatigues through UEs. Gait velocity: decreased Gait velocity interpretation: Below normal speed for age/gender    ADL:    Cognition: Cognition Overall Cognitive Status: Within Functional Limits for tasks assessed Orientation Level: Oriented X4 Cognition Arousal/Alertness: Awake/alert Behavior During Therapy: WFL for tasks assessed/performed Overall Cognitive Status: Within Functional Limits for tasks assessed  Blood pressure (!) 122/95, pulse (!) 106, temperature 98.7 F (37.1 C), temperature source Oral, resp. rate 16, height 5\' 3"  (1.6 m), weight 52.3 kg (115 lb 4.8 oz), SpO2 100 %. Physical Exam  HENT:  Head: Normocephalic.  Eyes: EOM  are normal.  Neck: Normal range of motion. Neck supple. No thyromegaly present.  Cardiovascular: Normal rate and regular rhythm.   Respiratory: Effort normal and breath sounds normal. No respiratory distress.  GI: Soft. Bowel sounds are normal. She exhibits no distension.  Neurological:  Patient is sitting up on a bed talking on cell phone. Back brace in place. Alert and oriented 3. Follows commands.  Skin: Skin is warm and dry.  Motor strength is 3 minus related to pain in the right deltoid, 4 at the grip. 3 minus related to pain at the biceps, triceps Left upper extremity 5/5 in the deltoid, biceps, triceps, grip Lower extremity strength is 4 minus at the hip flexor, knee extensors, ankle dorsiflexors. There is edema  bilateral feet. She does have sensation to light touch on both feet  No results found for this or any previous visit (from the past 24 hour(s)). No results found.  Assessment/Plan: Diagnosis: Deconditioning and thoracic compression fractures, T7/T8 without spinal cord injury 1. Does the need for close, 24 hr/day medical supervision in concert with the patient's rehab needs make it unreasonable for this patient to be served in a less intensive setting? Potentially 2. Co-Morbidities requiring supervision/potential complications: End-stage renal disease, cardiomyopathy 3. Due to bladder management, bowel management, safety, skin/wound care, disease management, medication administration, pain management and patient education, does the patient require 24 hr/day rehab nursing? Yes 4. Does the patient require coordinated care of a physician, rehab nurse, PT (1-2 hrs/day, 5 days/week) and OT (1-2 hrs/day, 5 days/week) to address physical and functional deficits in the context of the above medical diagnosis(es)? Yes Addressing deficits in the following areas: balance, endurance, locomotion, strength, transferring, bowel/bladder control, bathing, dressing, toileting and psychosocial support 5. Can the patient actively participate in an intensive therapy program of at least 3 hrs of therapy per day at least 5 days per week? Yes 6. The potential for patient to make measurable gains while on inpatient rehab is good 7. Anticipated functional outcomes upon discharge from inpatient rehab are modified independent and supervision  with PT, modified independent and supervision with OT, n/a with SLP. 8. Estimated rehab length of stay to reach the above functional goals is: 7-10d 9. Does the patient have adequate social supports and living environment to accommodate these discharge functional goals? Yes 10. Anticipated D/C setting: Home 11. Anticipated post D/C treatments: HH therapy 12. Overall Rehab/Functional  Prognosis: good  RECOMMENDATIONS: This patient's condition is appropriate for continued rehabilitative care in the following setting: CIR Patient has agreed to participate in recommended program. Potentially Note that insurance prior authorization may be required for reimbursement for recommended care.  Comment: Patient will discuss possible CIR stay with her parents, we discussed other options include home with home health, but 24 /7 care    08/17/2016

## 2016-08-17 NOTE — Progress Notes (Signed)
Subjective: Interval History: has complaints had issues so did not come to HD.  Objective: Vital signs in last 24 hours: Temp:  [98.2 F (36.8 C)-99.1 F (37.3 C)] 98.6 F (37 C) (10/26 0500) Pulse Rate:  [102-125] 103 (10/26 0500) Resp:  [16-25] 16 (10/26 0500) BP: (104-130)/(73-98) 108/89 (10/26 0500) SpO2:  [95 %-100 %] 99 % (10/26 0500) Weight:  [52.3 kg (115 lb 4.8 oz)] 52.3 kg (115 lb 4.8 oz) (10/25 1045) Weight change: -4.4 kg (-9 lb 11.2 oz)  Intake/Output from previous day: 10/25 0701 - 10/26 0700 In: 120 [P.O.:120] Out: 2501 [Stool:1] Intake/Output this shift: No intake/output data recorded.  General appearance: alert, cooperative and no distress Resp: clear to auscultation bilaterally Chest wall: brace Cardio: S1, S2 normal, systolic murmur: holosystolic 2/6, blowing at apex and friction rub heard much softer GI: soft,liiver down 5 cm , pos bs ExtremitieHD cath, fem on RHD cath, fem on R  Lab Results:  Recent Labs  08/15/16 0716 08/16/16 0700  WBC 9.1 9.5  HGB 9.0* 9.1*  HCT 28.6* 29.3*  PLT 196 241   BMET:  Recent Labs  08/15/16 0716 08/16/16 0700  NA 134* 140  K 4.2 4.1  CL 97* 101  CO2 23 26  GLUCOSE 76 79  BUN 45* 35*  CREATININE 6.93* 5.07*  CALCIUM 8.0* 9.5   No results for input(s): PTH in the last 72 hours. Iron Studies: No results for input(s): IRON, TIBC, TRANSFERRIN, FERRITIN in the last 72 hours.  Studies/Results: No results found.  I have reviewed the patient's current medications.  Assessment/Plan: 1 ESRD ok to D/C pericarditis better, will use no hep x 1 wk.  This will occur again as she has mostly psych issues 2 NONADHERENCE  Variety of excuses.  This is a consistent pattern.  Has led to hosp and poor health 3 Pericarditis resolving 4 CM low EF her own doing 5 Comp fx HPTH 6 HPTH meds and will discuss with Dr. Gerrit Friends 7 Anemia esa P HD Fri am, can d/c anytime.  Esa, vit D, cinnacalcet    LOS: 4 days   Marlayna Bannister  L 08/17/2016,7:26 AM

## 2016-08-17 NOTE — Progress Notes (Signed)
I will follow up with pt in the morning to clarify if she would like to pursue an inpt rehab admission. [phone]

## 2016-08-18 ENCOUNTER — Encounter (HOSPITAL_COMMUNITY): Payer: Self-pay

## 2016-08-18 ENCOUNTER — Inpatient Hospital Stay (HOSPITAL_COMMUNITY)
Admission: RE | Admit: 2016-08-18 | Discharge: 2016-09-02 | DRG: 981 | Disposition: A | Discharge status: Home Health | Payer: Medicare Other | Source: Intra-hospital | Attending: Physical Medicine & Rehabilitation | Admitting: Physical Medicine & Rehabilitation

## 2016-08-18 DIAGNOSIS — F3341 Major depressive disorder, recurrent, in partial remission: Secondary | ICD-10-CM

## 2016-08-18 DIAGNOSIS — T8612 Kidney transplant failure: Secondary | ICD-10-CM | POA: Diagnosis present

## 2016-08-18 DIAGNOSIS — Z95810 Presence of automatic (implantable) cardiac defibrillator: Secondary | ICD-10-CM | POA: Diagnosis not present

## 2016-08-18 DIAGNOSIS — I132 Hypertensive heart and chronic kidney disease with heart failure and with stage 5 chronic kidney disease, or end stage renal disease: Secondary | ICD-10-CM | POA: Diagnosis present

## 2016-08-18 DIAGNOSIS — Z79899 Other long term (current) drug therapy: Secondary | ICD-10-CM | POA: Diagnosis not present

## 2016-08-18 DIAGNOSIS — Z419 Encounter for procedure for purposes other than remedying health state, unspecified: Secondary | ICD-10-CM

## 2016-08-18 DIAGNOSIS — D631 Anemia in chronic kidney disease: Secondary | ICD-10-CM | POA: Diagnosis not present

## 2016-08-18 DIAGNOSIS — S22000S Wedge compression fracture of unspecified thoracic vertebra, sequela: Secondary | ICD-10-CM

## 2016-08-18 DIAGNOSIS — Z992 Dependence on renal dialysis: Secondary | ICD-10-CM

## 2016-08-18 DIAGNOSIS — I248 Other forms of acute ischemic heart disease: Secondary | ICD-10-CM | POA: Diagnosis present

## 2016-08-18 DIAGNOSIS — D62 Acute posthemorrhagic anemia: Secondary | ICD-10-CM

## 2016-08-18 DIAGNOSIS — Z91041 Radiographic dye allergy status: Secondary | ICD-10-CM

## 2016-08-18 DIAGNOSIS — Z9115 Patient's noncompliance with renal dialysis: Secondary | ICD-10-CM

## 2016-08-18 DIAGNOSIS — Z885 Allergy status to narcotic agent status: Secondary | ICD-10-CM

## 2016-08-18 DIAGNOSIS — M4854XD Collapsed vertebra, not elsewhere classified, thoracic region, subsequent encounter for fracture with routine healing: Principal | ICD-10-CM | POA: Diagnosis present

## 2016-08-18 DIAGNOSIS — R0989 Other specified symptoms and signs involving the circulatory and respiratory systems: Secondary | ICD-10-CM

## 2016-08-18 DIAGNOSIS — I509 Heart failure, unspecified: Secondary | ICD-10-CM | POA: Diagnosis present

## 2016-08-18 DIAGNOSIS — M8080XA Other osteoporosis with current pathological fracture, unspecified site, initial encounter for fracture: Secondary | ICD-10-CM

## 2016-08-18 DIAGNOSIS — S329XXS Fracture of unspecified parts of lumbosacral spine and pelvis, sequela: Secondary | ICD-10-CM | POA: Diagnosis not present

## 2016-08-18 DIAGNOSIS — Z9119 Patient's noncompliance with other medical treatment and regimen: Secondary | ICD-10-CM

## 2016-08-18 DIAGNOSIS — I82519 Chronic embolism and thrombosis of unspecified femoral vein: Secondary | ICD-10-CM | POA: Diagnosis not present

## 2016-08-18 DIAGNOSIS — Z940 Kidney transplant status: Secondary | ICD-10-CM | POA: Diagnosis not present

## 2016-08-18 DIAGNOSIS — I319 Disease of pericardium, unspecified: Secondary | ICD-10-CM | POA: Diagnosis present

## 2016-08-18 DIAGNOSIS — K219 Gastro-esophageal reflux disease without esophagitis: Secondary | ICD-10-CM | POA: Diagnosis present

## 2016-08-18 DIAGNOSIS — D5700 Hb-SS disease with crisis, unspecified: Secondary | ICD-10-CM

## 2016-08-18 DIAGNOSIS — L97229 Non-pressure chronic ulcer of left calf with unspecified severity: Secondary | ICD-10-CM | POA: Diagnosis present

## 2016-08-18 DIAGNOSIS — N189 Chronic kidney disease, unspecified: Secondary | ICD-10-CM | POA: Diagnosis not present

## 2016-08-18 DIAGNOSIS — D571 Sickle-cell disease without crisis: Secondary | ICD-10-CM | POA: Diagnosis present

## 2016-08-18 DIAGNOSIS — M8080XS Other osteoporosis with current pathological fracture, unspecified site, sequela: Secondary | ICD-10-CM | POA: Diagnosis not present

## 2016-08-18 DIAGNOSIS — D509 Iron deficiency anemia, unspecified: Secondary | ICD-10-CM | POA: Diagnosis not present

## 2016-08-18 DIAGNOSIS — S22000D Wedge compression fracture of unspecified thoracic vertebra, subsequent encounter for fracture with routine healing: Secondary | ICD-10-CM | POA: Diagnosis not present

## 2016-08-18 DIAGNOSIS — Z8271 Family history of polycystic kidney: Secondary | ICD-10-CM | POA: Diagnosis not present

## 2016-08-18 DIAGNOSIS — F419 Anxiety disorder, unspecified: Secondary | ICD-10-CM | POA: Diagnosis present

## 2016-08-18 DIAGNOSIS — Z91148 Patient's other noncompliance with medication regimen for other reason: Secondary | ICD-10-CM

## 2016-08-18 DIAGNOSIS — N186 End stage renal disease: Secondary | ICD-10-CM | POA: Diagnosis present

## 2016-08-18 DIAGNOSIS — Z886 Allergy status to analgesic agent status: Secondary | ICD-10-CM

## 2016-08-18 DIAGNOSIS — F324 Major depressive disorder, single episode, in partial remission: Secondary | ICD-10-CM | POA: Diagnosis present

## 2016-08-18 DIAGNOSIS — F418 Other specified anxiety disorders: Secondary | ICD-10-CM | POA: Diagnosis present

## 2016-08-18 DIAGNOSIS — I5023 Acute on chronic systolic (congestive) heart failure: Secondary | ICD-10-CM | POA: Diagnosis not present

## 2016-08-18 DIAGNOSIS — S329XXA Fracture of unspecified parts of lumbosacral spine and pelvis, initial encounter for closed fracture: Secondary | ICD-10-CM

## 2016-08-18 DIAGNOSIS — I8291 Chronic embolism and thrombosis of unspecified vein: Secondary | ICD-10-CM | POA: Diagnosis not present

## 2016-08-18 DIAGNOSIS — W19XXXD Unspecified fall, subsequent encounter: Secondary | ICD-10-CM | POA: Diagnosis present

## 2016-08-18 DIAGNOSIS — I82501 Chronic embolism and thrombosis of unspecified deep veins of right lower extremity: Secondary | ICD-10-CM | POA: Diagnosis present

## 2016-08-18 DIAGNOSIS — K625 Hemorrhage of anus and rectum: Secondary | ICD-10-CM | POA: Diagnosis not present

## 2016-08-18 DIAGNOSIS — Z86711 Personal history of pulmonary embolism: Secondary | ICD-10-CM

## 2016-08-18 DIAGNOSIS — I428 Other cardiomyopathies: Secondary | ICD-10-CM | POA: Diagnosis present

## 2016-08-18 DIAGNOSIS — N2581 Secondary hyperparathyroidism of renal origin: Secondary | ICD-10-CM | POA: Diagnosis present

## 2016-08-18 DIAGNOSIS — R609 Edema, unspecified: Secondary | ICD-10-CM | POA: Diagnosis not present

## 2016-08-18 DIAGNOSIS — S22000A Wedge compression fracture of unspecified thoracic vertebra, initial encounter for closed fracture: Secondary | ICD-10-CM | POA: Diagnosis present

## 2016-08-18 DIAGNOSIS — Z7901 Long term (current) use of anticoagulants: Secondary | ICD-10-CM

## 2016-08-18 DIAGNOSIS — D638 Anemia in other chronic diseases classified elsewhere: Secondary | ICD-10-CM | POA: Diagnosis not present

## 2016-08-18 DIAGNOSIS — M810 Age-related osteoporosis without current pathological fracture: Secondary | ICD-10-CM | POA: Diagnosis present

## 2016-08-18 DIAGNOSIS — R011 Cardiac murmur, unspecified: Secondary | ICD-10-CM | POA: Diagnosis present

## 2016-08-18 DIAGNOSIS — G8918 Other acute postprocedural pain: Secondary | ICD-10-CM

## 2016-08-18 DIAGNOSIS — Z9114 Patient's other noncompliance with medication regimen: Secondary | ICD-10-CM

## 2016-08-18 LAB — RENAL FUNCTION PANEL
Albumin: 2.8 g/dL — ABNORMAL LOW (ref 3.5–5.0)
Anion gap: 14 (ref 5–15)
BUN: 50 mg/dL — AB (ref 6–20)
CALCIUM: 9.8 mg/dL (ref 8.9–10.3)
CHLORIDE: 102 mmol/L (ref 101–111)
CO2: 25 mmol/L (ref 22–32)
CREATININE: 5.79 mg/dL — AB (ref 0.44–1.00)
GFR calc non Af Amer: 9 mL/min — ABNORMAL LOW (ref >60)
GFR, EST AFRICAN AMERICAN: 11 mL/min — AB (ref >60)
GLUCOSE: 88 mg/dL (ref 65–99)
Phosphorus: 5.4 mg/dL — ABNORMAL HIGH (ref 2.5–4.6)
Potassium: 4 mmol/L (ref 3.5–5.1)
SODIUM: 141 mmol/L (ref 135–145)

## 2016-08-18 LAB — CBC
HCT: 28.1 % — ABNORMAL LOW (ref 36.0–46.0)
Hemoglobin: 8.8 g/dL — ABNORMAL LOW (ref 12.0–15.0)
MCH: 27.5 pg (ref 26.0–34.0)
MCHC: 31.3 g/dL (ref 30.0–36.0)
MCV: 87.8 fL (ref 78.0–100.0)
PLATELETS: 249 10*3/uL (ref 150–400)
RBC: 3.2 MIL/uL — ABNORMAL LOW (ref 3.87–5.11)
RDW: 19.7 % — AB (ref 11.5–15.5)
WBC: 9.4 10*3/uL (ref 4.0–10.5)

## 2016-08-18 LAB — IRON AND TIBC
IRON: 17 ug/dL — AB (ref 28–170)
Saturation Ratios: 11 % (ref 10.4–31.8)
TIBC: 158 ug/dL — ABNORMAL LOW (ref 250–450)
UIBC: 141 ug/dL

## 2016-08-18 MED ORDER — CINACALCET HCL 30 MG PO TABS: 90.0000 mg | ORAL_TABLET | Freq: Every day | ORAL | 0 refills | Status: DC

## 2016-08-18 MED ORDER — NAPROXEN 250 MG PO TABS
500.0000 mg | ORAL_TABLET | Freq: Two times a day (BID) | ORAL | Status: DC
  Administered 2016-08-18 – 2016-09-01 (×27): 500 mg via ORAL
  Filled 2016-08-18 (×29): qty 2

## 2016-08-18 MED ORDER — LIDOCAINE-PRILOCAINE 2.5-2.5 % EX CREA: 1.0000 "application " | TOPICAL_CREAM | CUTANEOUS | Status: DC | PRN

## 2016-08-18 MED ORDER — HEPARIN SODIUM (PORCINE) 1000 UNIT/ML DIALYSIS: 1000.0000 [IU] | INTRAMUSCULAR | Status: DC | PRN

## 2016-08-18 MED ORDER — RENA-VITE PO TABS
1.0000 | ORAL_TABLET | Freq: Every day | ORAL | Status: DC
  Administered 2016-08-18 – 2016-09-02 (×15): 1 via ORAL
  Filled 2016-08-18 (×15): qty 1

## 2016-08-18 MED ORDER — DARBEPOETIN ALFA 100 MCG/0.5ML IJ SOSY: 100.0000 ug | PREFILLED_SYRINGE | INTRAMUSCULAR | Status: DC

## 2016-08-18 MED ORDER — CALCITRIOL 0.5 MCG PO CAPS
ORAL_CAPSULE | ORAL | Status: AC
  Administered 2016-08-18: 1 ug via ORAL
  Filled 2016-08-18: qty 2

## 2016-08-18 MED ORDER — ALTEPLASE 2 MG IJ SOLR: 2.0000 mg | Freq: Once | INTRAMUSCULAR | Status: DC | PRN

## 2016-08-18 MED ORDER — SEVELAMER CARBONATE 800 MG PO TABS: 2400.0000 mg | ORAL_TABLET | Freq: Three times a day (TID) | ORAL | 0 refills | Status: DC

## 2016-08-18 MED ORDER — PENTAFLUOROPROP-TETRAFLUOROETH EX AERO: 1.0000 "application " | INHALATION_SPRAY | CUTANEOUS | Status: DC | PRN

## 2016-08-18 MED ORDER — CALCITRIOL 0.5 MCG PO CAPS: 1.0000 ug | ORAL_CAPSULE | ORAL | 0 refills | Status: DC

## 2016-08-18 MED ORDER — SODIUM CHLORIDE 0.9 % IV SOLN: 100.0000 mL | INTRAVENOUS | Status: DC | PRN

## 2016-08-18 MED ORDER — CINACALCET HCL 30 MG PO TABS
90.0000 mg | ORAL_TABLET | Freq: Every day | ORAL | Status: DC
  Administered 2016-08-18 – 2016-09-01 (×13): 90 mg via ORAL
  Filled 2016-08-18 (×16): qty 3

## 2016-08-18 MED ORDER — CARVEDILOL 3.125 MG PO TABS: 3.1250 mg | ORAL_TABLET | Freq: Two times a day (BID) | ORAL | Status: DC

## 2016-08-18 MED ORDER — HYDROMORPHONE HCL 1 MG/ML IJ SOLN
INTRAMUSCULAR | Status: AC
  Administered 2016-08-18: 2 mg via INTRAVENOUS
  Filled 2016-08-18: qty 2

## 2016-08-18 MED ORDER — METHOCARBAMOL 500 MG PO TABS
500.0000 mg | ORAL_TABLET | Freq: Four times a day (QID) | ORAL | Status: DC | PRN
  Administered 2016-08-18 – 2016-09-02 (×32): 500 mg via ORAL
  Filled 2016-08-18 (×32): qty 1

## 2016-08-18 MED ORDER — CARVEDILOL 3.125 MG PO TABS: 3.1250 mg | ORAL_TABLET | Freq: Two times a day (BID) | ORAL | 0 refills | Status: DC

## 2016-08-18 MED ORDER — SERTRALINE HCL 50 MG PO TABS
100.0000 mg | ORAL_TABLET | Freq: Every day | ORAL | Status: DC
  Administered 2016-08-19 – 2016-09-02 (×14): 100 mg via ORAL
  Filled 2016-08-18 (×14): qty 2

## 2016-08-18 MED ORDER — CALCITRIOL 0.5 MCG PO CAPS
1.0000 ug | ORAL_CAPSULE | ORAL | Status: DC
  Administered 2016-08-21 – 2016-09-01 (×4): 1 ug via ORAL
  Filled 2016-08-18 (×6): qty 2

## 2016-08-18 MED ORDER — METHOCARBAMOL 500 MG PO TABS
500.0000 mg | ORAL_TABLET | Freq: Three times a day (TID) | ORAL | Status: DC | PRN
Start: 2016-08-18 — End: 2016-08-18

## 2016-08-18 MED ORDER — SORBITOL 70 % SOLN: 30.0000 mL | Freq: Every day | Status: DC | PRN

## 2016-08-18 MED ORDER — FAMOTIDINE 20 MG PO TABS: 20.0000 mg | ORAL_TABLET | Freq: Two times a day (BID) | ORAL | Status: DC | PRN

## 2016-08-18 MED ORDER — CALCITONIN (SALMON) 200 UNIT/ACT NA SOLN
1.0000 | Freq: Every day | NASAL | Status: DC
  Administered 2016-08-19 – 2016-09-01 (×13): 1 via NASAL
  Filled 2016-08-18 (×2): qty 3.7

## 2016-08-18 MED ORDER — LIDOCAINE HCL (PF) 1 % IJ SOLN: 5.0000 mL | INTRAMUSCULAR | Status: DC | PRN

## 2016-08-18 MED ORDER — OXYCODONE HCL 5 MG PO TABS
15.0000 mg | ORAL_TABLET | Freq: Four times a day (QID) | ORAL | Status: DC | PRN
  Administered 2016-08-18 – 2016-08-24 (×18): 15 mg via ORAL
  Filled 2016-08-18 (×18): qty 3

## 2016-08-18 MED ORDER — HYDROMORPHONE HCL 2 MG PO TABS
2.0000 mg | ORAL_TABLET | Freq: Four times a day (QID) | ORAL | Status: DC | PRN
  Administered 2016-08-18 – 2016-08-20 (×5): 2 mg via ORAL
  Filled 2016-08-18 (×5): qty 1

## 2016-08-18 MED ORDER — SEVELAMER CARBONATE 800 MG PO TABS
2400.0000 mg | ORAL_TABLET | Freq: Three times a day (TID) | ORAL | Status: DC
  Administered 2016-08-18 – 2016-08-30 (×34): 2400 mg via ORAL
  Filled 2016-08-18 (×34): qty 3

## 2016-08-18 NOTE — Procedures (Signed)
I was present at this session.  I have reviewed the session itself and made appropriate changes.  BP ^, ^ UF goal.  Fem cath flow 400.  Traeger Sultana L 10/27/20178:01 AM

## 2016-08-18 NOTE — Interval H&P Note (Signed)
[name] Small was admitted today to Inpatient Rehabilitation with the diagnosis of thoracic compression fracture.  The patient's history has been reviewed, patient examined, and there is no change in status.  Patient continues to be appropriate for intensive inpatient rehabilitation.  I have reviewed the patient's chart and labs.  Questions were answered to the patient's satisfaction. The PAPE has been reviewed and assessment remains appropriate.  [name] Small T 08/18/2016, 7:26 PM

## 2016-08-18 NOTE — Progress Notes (Signed)
OT Cancellation Note  Patient Details Name: Darria Lukacs MRN: [id number] DOB: 1991/07/18   Cancelled Treatment:    Reason Eval/Treat Not Completed: Pt for transfer to CIR today.  Will defer OT eval to CIR.  Acute OT will sign off at this time.   Kelechi Orgeron [place], OTR/L [phone]   Jeani Hawking M 08/18/2016, 2:11 PM

## 2016-08-18 NOTE — H&P (View-Only) (Signed)
Physical Medicine and Rehabilitation Admission H&P    Chief Complaint  Patient presents with  . Chest Pain  . Leg Swelling  : HPI: [name] Small is a 25 y.o. right handed female with history significant for end-stage renal disease with hemodialysis due to childhood polycystic kidney disease, severe systolic ejection fraction of 12%, AICD, medical noncompliance and narcotic abuse, pulmonary emboli. Per chart review patient lives alone. Independent with assistive device prior to admission. One level home. She was needing some assistance with ADLs since recent fall. Boyfriend and parents work during the day. Patient with reported recent fall admitted 07/26/2016-08/04/2016 sustaining T7 fracture 18-19 percent, subtle compression deformity superior endplate of T8, probable acute subacute fracture T12. Patient receive follow-up by Dr.  Rolena Infante of orthopedic services. Placed in a TLSO back brace. Discharge to home ambulating with assistive device and home health therapies recommended. Presented 08/13/2016 after missing several recent dialysis treatments due to reported transportation issues. Complaints of increased back pain. Chest x-ray showed basilar atelectasis with small right pleural effusion. Appeared to be a 50% compression fracture of what may represent T12 new since previous thoracic spine CT from 07/27/2016. Findings of fluid overload with follow-up cardiology services received emergent dialysis. Elevated troponin 0.2 felt to be demand ischemia. Follow-up renal services for ongoing dialysis. Physical therapy evaluation completed 08/17/2016 with recommendations of physical medicine rehabilitation consult.Patient was admitted for comprehensive rehabilitation program  ROS Constitutional: Negative for chills and fever.  HENT: Negative for hearing loss.   Respiratory: Positive for shortness of breath. Negative for cough.   Cardiovascular: Positive for palpitations and leg swelling.    Gastrointestinal: Positive for constipation. Negative for nausea and vomiting.       GERD  Musculoskeletal: Positive for joint pain and myalgias.  Skin: Negative for rash.  Neurological: Positive for headaches. Negative for dizziness and tingling.  Psychiatric/Behavioral: Positive for depression.       Anxiety  All other systems reviewed and are negative   Past Medical History:  Diagnosis Date  . AICD (automatic cardioverter/defibrillator) present 12/16/14   [employer]  . Anemia   . Anxiety   . Cardiomyopathy   . Cellulitis and abscess of face 03/22/2013  . CHF (congestive heart failure) ([place])   . Chronic anticoagulation   . Chronic pain   . Depression   . DVT (deep venous thrombosis) ([place]) ~ 2014   BLE  . Dysrhythmia    at times per pt.  . 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 ESRD (end stage renal disease) on dialysis [facility])    "MWF; [place]" (08/15/2016)  . Fracture, thoracic vertebra ([place]) 07/2016   "T7-T12"  . GERD (gastroesophageal reflux disease)   . H/O transfusion of packed red blood cells   . Heart murmur   . Hemodialysis patient ([place])    Mon. Wed. Fri  . Hyperkalemia 09/2015  . Hypertension   . Narcotic abuse, continuous   . Osteoporosis   . Pelvic fracture ([place]) 06/2015   "on the right"  . Polycystic kidney disease   . Pulmonary emboli ([facility]) 01/2012   Bilateral, moderate clot burden, areas of pulmonary infarction and central necrosis  . Renal insufficiency   . Sickle cell anemia ([facility])    Past Surgical History:  Procedure Laterality Date  . AV FISTULA PLACEMENT Bilateral    "right side stopped working & never had it revised" (04/11/2016)  .  AV FISTULA PLACEMENT Right 08/01/2016   Procedure: INSERTION OF ARTERIOVENOUS (AV) GORE-TEX GRAFT RIGHT ARM;  Surgeon: Angelia Mould, MD;  Location: [facility];  Service: Vascular;   Laterality: Right;  . [place] REMOVAL Right 08/03/2016   Procedure: REMOVAL OF ARTERIOVENOUS GORETEX GRAFT ([place]);  Surgeon: Waynetta Sandy, MD;  Location: [facility];  Service: Vascular;  Laterality: Right;  . CARDIAC CATHETERIZATION    . IMPLANTABLE CARDIOVERTER DEFIBRILLATOR IMPLANT Right 12/2014  . INCISION AND DRAINAGE ABSCESS Right 03/21/2013   Procedure: INCISION AND DRAINAGE RIGHT CHEEK ABSCESS REMOVAL OF FOREIGN BODY;  Surgeon: Izora Gala, MD;  Location: [place];  Service: ENT;  Laterality: Right;  . INSERTION OF DIALYSIS CATHETER Right Mar 09, 2016   thigh, Dr. Lucky Cowboy, [facility]  . 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 NEPHRECTOMY Right     kidney placed in 2008, and body rejected in 2012   . PERIPHERAL VASCULAR CATHETERIZATION Left 03/09/2016   Procedure: A/V Shuntogram/Fistulagram;  Surgeon: Algernon Huxley, MD;  Location: [place] [facility];  Service: Cardiovascular;  Laterality: Left;  . PERIPHERAL VASCULAR CATHETERIZATION N/A 05/09/2016   Procedure: Dialysis/Perma Catheter Removal;  Surgeon: Katha Cabal, MD;  Location: [place] [facility];  Service: Cardiovascular;  Laterality: N/A;  . PERIPHERAL VASCULAR CATHETERIZATION N/A 07/12/2016   Procedure: Dialysis/Perma Catheter Insertion;  Surgeon: Algernon Huxley, MD;  Location: [place] [facility];  Service: Cardiovascular;  Laterality: N/A;  . PERIPHERAL VASCULAR CATHETERIZATION Right 07/31/2016   Procedure: Upper Extremity Venography;  Surgeon: Waynetta Sandy, MD;  Location: [place] [facility];  Service: Cardiovascular;  Laterality: Right;  . REVISON OF ARTERIOVENOUS FISTULA Left 03/22/2016   Procedure: REVISON OF ARTERIOVENOUS FISTULA ( ARTEGRAFT );  Surgeon: Algernon Huxley, MD;  Location: [facility];  Service: Vascular;  Laterality: Left;  . TONSILLECTOMY AND ADENOIDECTOMY  ~ 2000   Family History  Problem Relation Age of Onset  . Polycystic kidney disease Father   . Hypertension Father    Social History:  reports that she quit  smoking about 8 months ago. Her smoking use included Cigarettes. She smoked 0.00 packs per day for 1.00 year. She has never used smokeless tobacco. She reports that she does not drink alcohol or use drugs. Allergies:  Allergies  Allergen Reactions  . Aspirin Other (See Comments)    Interacts with Coreg  . Morphine And Related Itching and Other (See Comments)    Ok with oxycodone   . Tramadol Anaphylaxis  . Vicodin [Hydrocodone-Acetaminophen] Hives  . Buprenorphine Hcl Itching and Other (See Comments)    Ok with oxycodone  . Iohexol Itching   Medications Prior to Admission  Medication Sig Dispense Refill  . calcitonin, salmon, (MIACALCIN) 200 UNIT/ACT nasal spray Place 1 spray into alternate nostrils daily. 3.7 mL 0  . calcitRIOL (ROCALTROL) 0.5 MCG capsule Take 2 capsules (1 mcg total) by mouth every Monday, Wednesday, and Friday with hemodialysis. 60 capsule 0  . carvedilol (COREG) 3.125 MG tablet Take 1 tablet (3.125 mg total) by mouth 2 (two) times daily with a meal. 60 tablet 0  . cinacalcet (SENSIPAR) 30 MG tablet Take 3 tablets (90 mg total) by mouth daily with supper. 90 tablet 0  . colchicine 0.6 MG tablet Take 0.5 tablet (0.3 mg total) by mouth two times per week. (Patient taking differently: Take 0.6 mg by mouth 2 (two) times a week. ) 8 tablet 3  . multivitamin (RENA-VIT) TABS tablet Take 1 tablet by mouth  daily. 90 tablet 3  . naproxen (NAPROSYN) 500 MG tablet Take 1 tablet (500 mg total) by mouth 2 (two) times daily with a meal. 60 tablet 0  . oxyCODONE (ROXICODONE) 15 MG immediate release tablet Take 1 tablet (15 mg total) by mouth every 6 (six) hours as needed. You must try to cut back on the frequency of these pills as your pain improves 30 tablet 0  . sertraline (ZOLOFT) 100 MG tablet Take 1 tablet (100 mg total) by mouth daily. 30 tablet 3  . sevelamer carbonate (RENVELA) 800 MG tablet Take 3 tablets (2,400 mg total) by mouth 3 (three) times daily with meals. 270 tablet 0      Home: Home Living Family/patient expects to be discharged to:: Unsure Living Arrangements: Spouse/significant other (pt and her boyfriend live together in [place]. Parents li) Available Help at Discharge: Family, Available PRN/intermittently Type of Home: Apartment Home Access: Level entry Home Layout: One level Bathroom Shower/Tub: Tub/shower unit, Architectural technologist: Standard Bathroom Accessibility: Yes Home Equipment: Walker - 2 wheels, Bedside commode Additional Comments: Pt sponge bathing since she has a femoreal dialysis catheter  Lives With: Significant other (Boyfriend of 4 years)   Functional History: Prior Function Level of Independence: Independent with assistive device(s), Needs assistance Gait / Transfers Assistance Needed: Requires use of RW for ambulation. Reports her dad had to carry her up the stairs to get into their home. ADL's / Homemaking Assistance Needed: Since fall and spinal fx, assist needed with ADLs from b/f. Not able to perform IADLs. Comments: Pt uses [place] transport to op dialysis  Functional Status:  Mobility: Bed Mobility Overal bed mobility: Needs Assistance Bed Mobility: Rolling, Sidelying to Sit Rolling: Supervision Sidelying to sit: Supervision, HOB elevated General bed mobility comments: Cues for log roll technique, use of rail for support. Able to adjust brace.  Transfers Overall transfer level: Needs assistance Equipment used: Rolling walker (2 wheeled) Transfers: Sit to/from Stand Sit to Stand: Min assist General transfer comment: Assist to stand from EOB with cues for hand placement/technique. Pain through feet. LOB upon standing posteriorly. Ambulation/Gait Ambulation/Gait assistance: Min assist Ambulation Distance (Feet): 20 Feet Assistive device: Rolling walker (2 wheeled) Gait Pattern/deviations: Step-through pattern, Decreased stride length, Trunk flexed, Narrow base of support General Gait Details: Slow, unsteady  gait with bil knee instability. Assist for balance/safety. Trembling in BLEs noted. Cues for breathing. Fatigues through UEs. Gait velocity: decreased Gait velocity interpretation: Below normal speed for age/gender    ADL:    Cognition: Cognition Overall Cognitive Status: Within Functional Limits for tasks assessed Orientation Level: Oriented X4 Cognition Arousal/Alertness: Awake/alert Behavior During Therapy: WFL for tasks assessed/performed Overall Cognitive Status: Within Functional Limits for tasks assessed  Physical Exam: Blood pressure (!) 119/92, pulse 97, temperature 98.1 F (36.7 C), temperature source Oral, resp. rate 18, height _0  (1.6 m), weight 50.8 kg (111 lb 15.9 oz), SpO2 96 %. Physical Exam  Constitutional: She is oriented to person, place, and time. She appears well-developed.  Slight build  HENT:  Head: Normocephalic and atraumatic.  Right Ear: External ear normal.  Left Ear: External ear normal.  Eyes: Conjunctivae are normal. Pupils are equal, round, and reactive to light.  Neck: No tracheal deviation present. No thyromegaly present.  Cardiovascular: Normal rate, regular rhythm and intact distal pulses.  Exam reveals no gallop and no friction rub.   No murmur heard. Respiratory: No respiratory distress. She has no wheezes. She has no rales. She exhibits no tenderness.  GI: She exhibits no distension. There is no tenderness. There is no rebound.  Musculoskeletal: She exhibits no edema.  Wearing TLSO which is fitting appropriately  Neurological: She is oriented to person, place, and time. No cranial nerve deficit. Coordination normal.  Strength 4/4 UE prox to distal. 4-HF with pain inhibition. KE and ADF/PF 4/5. No sensory abnormalities.   Skin: No rash noted. No erythema.      Results for orders placed or performed during the hospital encounter of 08/12/16 (from the past 48 hour(s))  Renal function panel     Status: Abnormal   Collection Time:  08/18/16  7:26 AM  Result Value Ref Range   Sodium 141 135 - 145 mmol/L   Potassium 4.0 3.5 - 5.1 mmol/L   Chloride 102 101 - 111 mmol/L   CO2 25 22 - 32 mmol/L   Glucose, Bld 88 65 - 99 mg/dL   BUN 50 (H) 6 - 20 mg/dL   Creatinine, Ser 5.79 (H) 0.44 - 1.00 mg/dL   Calcium 9.8 8.9 - 10.3 mg/dL   Phosphorus 5.4 (H) 2.5 - 4.6 mg/dL   Albumin 2.8 (L) 3.5 - 5.0 g/dL   GFR calc non Af Amer 9 (L) >60 mL/min   GFR calc Af Amer 11 (L) >60 mL/min    Comment: (NOTE) The eGFR has been calculated using the CKD EPI equation. This calculation has not been validated in all clinical situations. eGFR's persistently <60 mL/min signify possible Chronic Kidney Disease.    Anion gap 14 5 - 15  CBC     Status: Abnormal   Collection Time: 08/18/16  7:26 AM  Result Value Ref Range   WBC 9.4 4.0 - 10.5 K/uL   RBC 3.20 (L) 3.87 - 5.11 MIL/uL   Hemoglobin 8.8 (L) 12.0 - 15.0 g/dL   HCT 28.1 (L) 36.0 - 46.0 %   MCV 87.8 78.0 - 100.0 fL   MCH 27.5 26.0 - 34.0 pg   MCHC 31.3 30.0 - 36.0 g/dL   RDW 19.7 (H) 11.5 - 15.5 %   Platelets 249 150 - 400 K/uL  Iron and TIBC     Status: Abnormal   Collection Time: 08/18/16  8:29 AM  Result Value Ref Range   Iron 17 (L) 28 - 170 ug/dL   TIBC 158 (L) 250 - 450 ug/dL   Saturation Ratios 11 10.4 - 31.8 %   UIBC 141 ug/dL   No results found.     Medical Problem List and Plan: 1.  Weakness and functional deficits due to thoracic compression fractures, T7-T8 secondary to recent fall. Back brace when out of bed  -admit to inpatient rehab 2.  DVT Prophylaxis/Anticoagulation: SCD.Check vascular study 3. Pain Management with a history of narcotic abuse: Naprosyn 500 mg BID, Oxycodone and Robaxin as needed 4. Mood: Zoloft 100 mg daily 5. Neuropsych: This patient is capable of making decisions on her own behalf. 6. Skin/Wound Care: Routine skin checks 7. Fluids/Electrolytes/Nutrition: Routine I&O with follow-up chemistries on admit 8.ESRD due to childhood  polycystic kidney disease with history of noncompliance to dialysis. 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 Acute on chronic anemia. Follow-up CBC 11. Medical noncompliance. Counseling   Post Admission Physician Evaluation: 1. Functional deficits secondary  to thoracic compression fractures. 2. Patient is admitted to receive collaborative, interdisciplinary care between the physiatrist, rehab nursing staff, and therapy team. 3. Patient's level of medical complexity and substantial therapy needs in context of that medical necessity cannot be provided at  a lesser intensity of care such as a SNF. 4. Patient has experienced substantial functional loss from his/her baseline which was documented above under the "Functional History" and "Functional Status" headings.  Judging by the patient's diagnosis, physical exam, and functional history, the patient has potential for functional progress which will result in measurable gains while on inpatient rehab.  These gains will be of substantial and practical use upon discharge  in facilitating mobility and self-care at the household level. 5. Physiatrist will provide 24 hour management of medical needs as well as oversight of the therapy plan/treatment and provide guidance as appropriate regarding the interaction of the two. 6. 24 hour rehab nursing will assist with bladder management, bowel management, safety, skin/wound care, disease management, medication administration, pain management and patient education  and help integrate therapy concepts, techniques,education, etc. 7. PT will assess and treat for/with: Lower extremity strength, range of motion, stamina, balance, functional mobility, safety, adaptive techniques and equipment, pain mgt, spinal precautions, community reintegration.   Goals are: mod I. 8. OT will assess and treat for/with: ADL's, functional mobility, safety, upper extremity strength, adaptive techniques and equipment, pain mgt,  spinal precautions, ego support, pain control.   Goals are: mod I to set up. Therapy may proceed with showering this patient. 9. SLP will assess and treat for/with: n/a.  Goals are: n/a. 10. Case Management and Social Worker will assess and treat for psychological issues and discharge planning. 11. Team conference will be held weekly to assess progress toward goals and to determine barriers to discharge. 12. Patient will receive at least 3 hours of therapy per day at least 5 days per week. 13. ELOS: 7-10 days       14. Prognosis:  excellent     Meredith Staggers, MD, [facility] 08/18/2016  08/18/2016

## 2016-08-18 NOTE — Progress Notes (Signed)
Patient was informed about rehab process including patient booklet and safety plan.

## 2016-08-18 NOTE — PMR Pre-admission (Signed)
PMR Admission Coordinator Pre-Admission Assessment  Patient: [name] Small is an 25 y.o., female MRN: [id number] DOB: 03-16-1991 Height: 5\' 3"  (160 cm) Weight: 54.8 kg (120 lb 13 oz)              Insurance Information HMO:     PPO:      PCP:      IPA:      80/20: yes     OTHER: no HMO PRIMARY: Medicare a and b      Policy#: [id number]      Subscriber: pt Benefits:  Phone #: passport one online     Name: 08/18/2016 Eff. Date: 09/23/2011     Deduct: $1316      Out of Pocket Max: none      Life Max: none CIR: 100%      SNF: 20 full days Outpatient: 80%     Co-Pay: 20% Home Health: 100%      Co-Pay: none DME: 80%  Co-Pay: 20% Providers: pt choice  SECONDARY: [employer]      Policy#: [id number]      Subscriber: Mom Secondary verified 10/27 to Medicare. No PAC required   Third: Medicaid  [employer] policy: [id number]  Medicaid Application Date:       Case Manager:  Disability Application Date:       Case Worker:   Emergency Contact Information Contact Information    Name Relation Home Work Mobile   [place] Mother   [phone]   [place], Ambriz Father   [phone]     Current Medical History  Patient Admitting Diagnosis:  Deconditioning and thoracic compression fractures, T7 and T8 without spinal cord injury  History of Present Illness:        : HPI: Turkey Carteris a 25 y.o.right handed femalewith history significant for end-stage renal disease with hemodialysis due to childhood polycystic kidney disease, severe systolic ejection fraction of 22%, AICD,medicalnoncomplianceand narcotic abuse, pulmonary emboli. Boyfriend and parents work during the day.Patient with reported recent fall admitted 07/26/2016-08/04/2016 sustaining T7 fracture 18-19 percent, subtle compression deformity superior endplate of T8, probable acute subacute fracture T12. Patient receive follow-up by Dr. Shon Baton of orthopedic services. Placed in a TLSO back brace. Discharge to home ambulating  with assistive device and home health therapies recommended. Presented 08/13/2016 after missing several recent dialysis treatments due to reported transportation issues. Complaints of increased back pain. Chest x-ray showed basilar atelectasis with small right pleural effusion. Appeared to be a 50% compression fracture of what may represent T12 new since previous thoracic spine CT from 07/27/2016. Findings of fluid overload with follow-up cardiology services received emergent dialysis. Elevated troponin 0.2 felt to be demand ischemia. Follow-up renal services for ongoing dialysis.   Past Medical History  Past Medical History:  Diagnosis Date  . 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 Anemia   . Anxiety   . Cardiomyopathy   . Cellulitis and abscess of face 03/22/2013  . CHF (congestive heart failure) (HCC)   . Chronic anticoagulation   . Chronic pain   . Depression   . DVT (deep venous thrombosis) (HCC) ~ 2014   BLE  . Dysrhythmia    at times per pt.  . 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 ESRD (end stage renal disease) on dialysis [facility])    "MWF; [facility]" (08/15/2016)  . Fracture, thoracic vertebra (HCC) 07/2016   "T7-T12"  .  GERD (gastroesophageal reflux disease)   . H/O transfusion of packed red blood cells   . Heart murmur   . Hemodialysis patient (HCC)    Mon. Wed. Fri  . Hyperkalemia 09/2015  . Hypertension   . Narcotic abuse, continuous   . Osteoporosis   . Pelvic fracture (HCC) 06/2015   "on the right"  . Polycystic kidney disease   . Pulmonary emboli (HCC) 01/2012   Bilateral, moderate clot burden, areas of pulmonary infarction and central necrosis  . Renal insufficiency   . Sickle cell anemia (HCC)     Family History  family history includes Hypertension in her father; Polycystic kidney disease in her  father.  Prior Rehab/Hospitalizations:  Has the patient had major surgery during 100 days prior to admission? No  Current Medications   Current Facility-Administered Medications:  .  0.9 %  sodium chloride infusion, 250 mL, Intravenous, PRN, Haydee Monica, MD .  0.9 %  sodium chloride infusion, 100 mL, Intravenous, PRN, Beryle Lathe, MD .  0.9 %  sodium chloride infusion, 100 mL, Intravenous, PRN, Beryle Lathe, MD .  alteplase (CATHFLO ACTIVASE) injection 2 mg, 2 mg, Intracatheter, Once PRN, Beryle Lathe, MD .  calcitonin (salmon) (MIACALCIN/FORTICAL) nasal spray 1 spray, 1 spray, Alternating Nares, Daily, Haydee Monica, MD .  calcitRIOL (ROCALTROL) capsule 1 mcg, 1 mcg, Oral, Q M,W,F-HD, Beryle Lathe, MD, 1 mcg at 08/18/16 [phone] .  cinacalcet (SENSIPAR) tablet 90 mg, 90 mg, Oral, Q supper, Beryle Lathe, MD, 90 mg at 08/17/16 1702 .  Darbepoetin Alfa (ARANESP) injection 100 mcg, 100 mcg, Intravenous, Q Tue-HD, Beryle Lathe, MD, 100 mcg at 08/15/16 1109 .  diphenhydrAMINE (BENADRYL) capsule 50 mg, 50 mg, Oral, Q6H PRN, Megan Mans Ejigiri, PA-C, 50 mg at 08/16/16 0725 .  famotidine (PEPCID) tablet 20 mg, 20 mg, Oral, BID PRN, Haydee Monica, MD, 20 mg at 08/17/16 9604 .  heparin injection 1,000 Units, 1,000 Units, Dialysis, PRN, Beryle Lathe, MD .  HYDROmorphone (DILAUDID) injection 2 mg, 2 mg, Intravenous, Q4H PRN, Vassie Loll, MD, 2 mg at 08/18/16 0942 .  lidocaine (PF) (XYLOCAINE) 1 % injection 5 mL, 5 mL, Intradermal, PRN, Beryle Lathe, MD .  lidocaine-prilocaine (EMLA) cream 1 application, 1 application, Topical, PRN, Beryle Lathe, MD .  methocarbamol (ROBAXIN) tablet 500 mg, 500 mg, Oral, Q8H PRN, Vassie Loll, MD, 500 mg at 08/18/16 0012 .  multivitamin (RENA-VIT) tablet 1 tablet, 1 tablet, Oral, QHS, Haydee Monica, MD, 1 tablet at 08/18/16 0012 .  naproxen (NAPROSYN) tablet 500 mg, 500 mg, Oral, BID WC, Haydee Monica, MD, 500 mg at 08/17/16 1702 .   ondansetron (ZOFRAN) tablet 4 mg, 4 mg, Oral, Q6H PRN **OR** ondansetron (ZOFRAN) injection 4 mg, 4 mg, Intravenous, Q6H PRN, Haydee Monica, MD .  oxyCODONE (Oxy IR/ROXICODONE) immediate release tablet 15 mg, 15 mg, Oral, Q6H PRN, Haydee Monica, MD, 15 mg at 08/17/16 1154 .  pentafluoroprop-tetrafluoroeth (GEBAUERS) aerosol 1 application, 1 application, Topical, PRN, Beryle Lathe, MD .  sertraline (ZOLOFT) tablet 100 mg, 100 mg, Oral, Daily, Haydee Monica, MD, 100 mg at 08/17/16 0804 .  sevelamer carbonate (RENVELA) tablet 2,400 mg, 2,400 mg, Oral, TID WC, Lenny Pastel, PA-C, 2,400 mg at 08/17/16 1702 .  sodium chloride flush (NS) 0.9 % injection 3 mL, 3 mL, Intravenous, Q12H, Haydee Monica, MD, 3 mL at 08/14/16 2000 .  sodium chloride flush (NS) 0.9 % injection 3 mL, 3 mL, Intravenous, Q12H, Rachal Cliffton Asters,  MD, 3 mL at 08/18/16 0013 .  sodium chloride flush (NS) 0.9 % injection 3 mL, 3 mL, Intravenous, PRN, Haydee Monicaachal A David, MD  Patients Current Diet: Diet renal with fluid restriction Fluid restriction: 1200 mL Fluid; Room service appropriate? Yes; Fluid consistency: Thin  Precautions / Restrictions Precautions Precautions: Fall, Back Precaution Booklet Issued: No Spinal Brace: Thoracolumbosacral orthotic Restrictions Weight Bearing Restrictions: No   Has the patient had 2 or more falls or a fall with injury in the past year?No  Prior Activity Level Limited Community (1-2x/wk): does not drive; uses RW due to pelvix fracture a year ago  Journalist, newspaperHome Assistive Devices / Equipment Home Assistive Devices/Equipment: Environmental consultantWalker (specify type), Brace (specify type) Home Equipment: Walker - 2 wheels, Bedside commode  Prior Device Use: Indicate devices/aids used by the patient prior to current illness, exacerbation or injury? Walker Used walker since pelvic fx last year  Prior Functional Level Prior Function Level of Independence: Independent with assistive device(s), Needs assistance Gait /  Transfers Assistance Needed: Requires use of RW for ambulation. Reports her dad had to carry her up the stairs to get into their home. ADL's / Homemaking Assistance Needed: Since fall and spinal fx, assist needed with ADLs from b/f. Not able to perform IADLs. Comments: Pt uses [facility] transport to op dialysis  Self Care: Did the patient need help bathing, dressing, using the toilet or eating?  Independent was Mod I with RW until recent thoracic fxs  Indoor Mobility: Did the patient need assistance with walking from room to room (with or without device)? Needed some help  Stairs: Did the patient need assistance with internal or external stairs (with or without device)? Needed some help  Functional Cognition: Did the patient need help planning regular tasks such as shopping or remembering to take medications? Independent  Current Functional Level Cognition  Overall Cognitive Status: Within Functional Limits for tasks assessed Orientation Level: Oriented X4    Extremity Assessment (includes Sensation/Coordination)  Upper Extremity Assessment: Defer to OT evaluation, Generalized weakness  Lower Extremity Assessment: Generalized weakness, RLE deficits/detail, LLE deficits/detail (difficult to assess due to pain but trembling noted during standing and functional tasks.) RLE Sensation: decreased light touch (tingling reported and burning.) LLE Sensation: decreased light touch (tingling reported and burning.)    ADLs       Mobility  Overal bed mobility: Needs Assistance Bed Mobility: Rolling, Sidelying to Sit Rolling: Supervision Sidelying to sit: Supervision, HOB elevated General bed mobility comments: Cues for log roll technique, use of rail for support. Able to adjust brace.     Transfers  Overall transfer level: Needs assistance Equipment used: Rolling walker (2 wheeled) Transfers: Sit to/from Stand Sit to Stand: Min assist General transfer comment: Assist to stand from EOB with  cues for hand placement/technique. Pain through feet. LOB upon standing posteriorly.    Ambulation / Gait / Stairs / Wheelchair Mobility  Ambulation/Gait Ambulation/Gait assistance: ArchitectMin assist Ambulation Distance (Feet): 20 Feet Assistive device: Rolling walker (2 wheeled) Gait Pattern/deviations: Step-through pattern, Decreased stride length, Trunk flexed, Narrow base of support General Gait Details: Slow, unsteady gait with bil knee instability. Assist for balance/safety. Trembling in BLEs noted. Cues for breathing. Fatigues through UEs. Gait velocity: decreased Gait velocity interpretation: Below normal speed for age/gender    Posture / Balance Dynamic Sitting Balance Sitting balance - Comments: Able to adjust brace sitting EOB without difficulty.  Balance Overall balance assessment: Needs assistance Sitting-balance support: Feet supported, No upper extremity supported Sitting balance-Leahy Scale: Fair Sitting  balance - Comments: Able to adjust brace sitting EOB without difficulty.  Standing balance support: During functional activity, Bilateral upper extremity supported Standing balance-Leahy Scale: Poor Standing balance comment: Reliant on BUEs for support and close Min guard with 1 instance of posterior LOB.    Special needs/care consideration BiPAP/CPAP   N/a CPM   N/a Continuous Drip IV   N/a Dialysis Monday, Wednesday Friday was receiving OP HD in [place], but recent change to [facility]. Has not been to that center yet Life Vest  N/a Oxygen  N/a Special Bed  N/a Trach Size  N/a Wound Vac (area) n/a Skin left femoral hemodialysis catheter Bowel mgmt: continent LBM 10/26 Bladder mgmt: anuric due to ESRD Diabetic mgmt  N/a   Previous Home Environment Living Arrangements: Spouse/significant other (pt and her boyfriend live together in [place]. Parents li)  Lives With: Significant other (Boyfriend of 4 years) Available Help at Discharge: Family, Available  PRN/intermittently Type of Home: Apartment Home Layout: One level Home Access: Level entry Bathroom Shower/Tub: Tub/shower unit, Engineer, building services: Standard Bathroom Accessibility: Yes How Accessible: Accessible via walker Home Care Services: Yes Type of Home Care Services: Home RN Home Care Agency (if known): [facility] Additional Comments: Pt sponge bathing since she has a femoreal dialysis catheter  Discharge Living Setting Plans for Discharge Living Setting: Lives with (comment), Apartment (boyfriend) Type of Home at Discharge: Apartment Discharge Home Layout: One level Discharge Home Access: Level entry Discharge Bathroom Shower/Tub: Tub/shower unit, Curtain Discharge Bathroom Toilet: Standard Discharge Bathroom Accessibility: Yes How Accessible: Accessible via walker Does the patient have any problems obtaining your medications?: No  Social/Family/Support Systems Patient Roles: Partner Contact Information: Bernestine Amass, Mom Anticipated Caregiver: Mom and Boyfriend intermittently Anticipated Caregiver's Contact Information: see above Ability/Limitations of Caregiver: Mom works days close to [employer] apartment; can she in intermittently throughout the workday on pt; Boyfriend delivers out of town for work and may be gone a week at a time Medical laboratory scientific officer: Intermittent Discharge Plan Discussed with Primary Caregiver: Yes Is Caregiver In Agreement with Plan?: Yes Does Caregiver/Family have Issues with Lodging/Transportation while Pt is in Rehab?: No  Goals/Additional Needs Patient/Family Goal for Rehab: Mod I to supervision with PT, OT Expected length of stay: ELOS 7- 10 days Special Service Needs: ESRD on OP hemodilaysis M, W, F. Was receiving OP in [place]. [facility] Center [facility] Additional Information: Pt has been noncomliant to OP hemodialyis due to changing centers, transportation, etc Pt/Family Agrees to Admission and willing to participate:  Yes Program Orientation Provided & Reviewed with Pt/Caregiver Including Roles  & Responsibilities: Yes  Decrease burden of Care through IP rehab admission: n/a  Possible need for SNF placement upon discharge: not anticipated  Patient Condition: This patient's condition remains as documented in the consult dated 08/17/2016, in which the Rehabilitation Physician determined and documented that the patient's condition is appropriate for intensive rehabilitative care in an inpatient rehabilitation facility. Will admit to inpatient rehab today.   Preadmission Screen Completed By:  Clois Dupes, 08/18/2016 11:52 AM ______________________________________________________________________   Discussed status with Dr. Riley Kill on 08/18/2016 at  1345 and received telephone approval for admission today.  Admission Coordinator:  Clois Dupes, time 1610 Date 08/18/2016

## 2016-08-18 NOTE — Progress Notes (Signed)
PT Cancellation Note  Patient Details Name: Charmayne Varon MRN: [id number] DOB: [birth year]   Cancelled Treatment:    Reason Eval/Treat Not Completed: Patient at procedure or test/unavailable.  Pt currently in HD.  Will f/u another time.     Moroni Nester, Alison Murray 08/18/2016, 10:44 AM

## 2016-08-18 NOTE — Consult Note (Signed)
[facility] [place]  Patient name: [name] SorrowVictoria Arrellano MRN: [id number] DOB: 10-22-1991 Sex: female  REASON FOR CONSULT: thigh graft, consult is from Bard HerbertMarty Bergman, [place].  HPI: [name] Small is a 25 y.o. female, who presents for evaluation for a femoral AV thigh graft. The patient is well known to our practice from having undergone previous access procedures. She recently underwent right upper arm graft placement that was ligated due to steal syndrome. She also had a previous fistula in that right arm. She also has a history failed left arm graft and fistulas. She has had previous tunneled dialysis catheters in both femoral veins. She is currently dialyzing via a left femoral tunneled dialysis catheter. She does complain of some numbness in her feet bilaterally.   She was recently admitted secondary to chest pain and back pain. She has also been noncompliant with dialysis. She is still having back pain from recent had compression fractures (T7-T8-T12). She has a right AICD.   Past Medical History:  Diagnosis Date  . AICD (automatic cardioverter/defibrillator) present 12/16/14   [employer]  . Anemia   . Anxiety   . Cardiomyopathy   . Cellulitis and abscess of face 03/22/2013  . CHF (congestive heart failure) (HCC)   . Chronic anticoagulation   . Chronic pain   . Depression   . DVT (deep venous thrombosis) (HCC) ~ 2014   BLE  . Dysrhythmia    at times per pt.  . 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 ESRD (end stage renal disease) on dialysis [facility](HCC)    "MWF; [facility]" (08/15/2016)  . Fracture, thoracic vertebra (HCC) 07/2016   "T7-T12"  . GERD (gastroesophageal reflux disease)   . H/O transfusion of packed red blood cells   . Heart murmur   . Hemodialysis patient (HCC)    Mon. Wed. Fri  . Hyperkalemia 09/2015  . Hypertension   .  Narcotic abuse, continuous   . Osteoporosis   . Pelvic fracture (HCC) 06/2015   "on the right"  . Polycystic kidney disease   . Pulmonary emboli (HCC) 01/2012   Bilateral, moderate clot burden, areas of pulmonary infarction and central necrosis  . Renal insufficiency   . Sickle cell anemia (HCC)     Family History  Problem Relation Age of Onset  . Polycystic kidney disease Father   . Hypertension Father     SOCIAL HISTORY: Social History   Social History  . Marital status: Single    Spouse name: N/A  . Number of children: N/A  . Years of education: N/A   Occupational History  . Not on file.   Social History Main Topics  . Smoking status: Former Smoker    Packs/day: 0.00    Years: 1.00    Types: Cigarettes    Quit date: 12/08/2015  . Smokeless tobacco: Never Used  . Alcohol use No  . Drug use: No  . Sexual activity: Not Currently    Birth control/ protection: None   Other Topics Concern  . Not on file   Social History Narrative   Was smoking 3 cigs per day. Lives at home with roommates, has some family locally.     Allergies  Allergen Reactions  . Aspirin Other (See Comments)    Interacts with Coreg  . Morphine And Related Itching and Other (See Comments)    Ok with oxycodone   .  Tramadol Anaphylaxis  . Vicodin [Hydrocodone-Acetaminophen] Hives  . Buprenorphine Hcl Itching and Other (See Comments)    Ok with oxycodone  . Iohexol Itching    Current Facility-Administered Medications  Medication Dose Route Frequency Provider Last Rate Last Dose  . [START ON 08/19/2016] calcitonin (salmon) (MIACALCIN/FORTICAL) nasal spray 1 spray  1 spray Alternating Nares Daily Daniel J Angiulli, PA-C      . [START ON 08/21/2016] calcitRIOL (ROCALTROL) capsule 1 mcg  1 mcg Oral Q M,W,F-HD Daniel J Angiulli, PA-C      . cinacalcet (SENSIPAR) tablet 90 mg  90 mg Oral Q supper Mcarthur Rossetti Angiulli, PA-C   90 mg at 08/18/16 1651  . [START ON 08/22/2016] Darbepoetin Alfa (ARANESP)  injection 100 mcg  100 mcg Intravenous Q Tue-HD Daniel J Angiulli, PA-C      . famotidine (PEPCID) tablet 20 mg  20 mg Oral BID PRN Mcarthur Rossetti Angiulli, PA-C      . heparin injection 1,000 Units  1,000 Units Dialysis PRN Mcarthur Rossetti Angiulli, PA-C      . methocarbamol (ROBAXIN) tablet 500 mg  500 mg Oral Q6H PRN Mcarthur Rossetti Angiulli, PA-C   500 mg at 08/18/16 1730  . multivitamin (RENA-VIT) tablet 1 tablet  1 tablet Oral QHS Daniel J Angiulli, PA-C      . naproxen (NAPROSYN) tablet 500 mg  500 mg Oral BID WC Daniel J Angiulli, PA-C   500 mg at 08/18/16 1712  . oxyCODONE (Oxy IR/ROXICODONE) immediate release tablet 15 mg  15 mg Oral Q6H PRN Mcarthur Rossetti Angiulli, PA-C      . Melene Muller ON 08/19/2016] sertraline (ZOLOFT) tablet 100 mg  100 mg Oral Daily Daniel J Angiulli, PA-C      . sevelamer carbonate (RENVELA) tablet 2,400 mg  2,400 mg Oral TID WC Daniel J Angiulli, PA-C   2,400 mg at 08/18/16 1712  . sorbitol 70 % solution 30 mL  30 mL Oral Daily PRN Mcarthur Rossetti Angiulli, PA-C        REVIEW OF SYSTEMS:  [X]  denotes positive finding, [ ]  denotes negative finding Cardiac  Comments:  Chest pain or chest pressure:    Shortness of breath upon exertion:    Short of breath when lying flat:    Irregular heart rhythm:        Vascular    Pain in calf, thigh, or hip brought on by ambulation:    Pain in feet at night that wakes you up from your sleep:     Blood clot in your veins:    Leg swelling:         Pulmonary    Oxygen at home:    Productive cough:     Wheezing:         Neurologic    Sudden weakness in arms or legs:     Sudden numbness in arms or legs:     Sudden onset of difficulty speaking or slurred speech:    Temporary loss of vision in one eye:     Problems with dizziness:         Gastrointestinal    Blood in stool:     Vomited blood:         Genitourinary    Burning when urinating:     Blood in urine:        Psychiatric    Major depression:         Hematologic    Bleeding problems:     Problems with  blood clotting too easily:        Skin    Rashes or ulcers:        Constitutional    Fever or chills:      PHYSICAL EXAM: Vitals:   08/18/16 1520  BP: 126/84  Pulse: (!) 108  Resp: 18  Temp: 99.5 F (37.5 C)  TempSrc: Oral  SpO2: 100%  Weight: 121 lb (54.9 kg)  Height: 5\' 3"  (1.6 m)    GENERAL: The patient is a well-nourished female, in no acute distress. The vital signs are documented above. VASCULAR: Left femoral TDC. Previous graft in left arm. Right arm swelling. Some residual hematoma, left upper arm. Right arm is more swollen than left. Feet are warm bilaterally.  PULMONARY: Non labored respiratory effort.  MUSCULOSKELETAL: No muscle wasting or atrophy NEUROLOGIC: No focal deficits.  SKIN: There are no ulcers or rashes noted. PSYCHIATRIC: The patient has a normal affect.  MEDICAL ISSUES: ESRD on HD MWF  The patient is currently dialyzing via a left femoral TDC. She has a recent history of right upper arm graft that was ligated secondary to steal. She has had multiple failed accesses in her arm. She will be discharged to CIR today. Plan for a thigh graft sometime next week. Dr. Arbie Cookey to see patient.    Maris Berger, PA-C [facility] [place] [phone]   I have examined the patient, reviewed and agree with above.  Very unfortunate 25 year old with multiple medical problems.  The evaluation for possible femoral loop graft. She is dialyzing via a left femoral catheter. She does have a palpable right dorsalis pedis pulse. Currently is undergoing rehabilitation for her recent thoracic vertebral fracture. Will follow along with you and plan new femoral loop graft when she is more stable from her acute fracture  Gretta Began, MD 08/18/2016 7:24 PM

## 2016-08-18 NOTE — H&P (Signed)
Physical Medicine and Rehabilitation Admission H&P    Chief Complaint  Patient presents with  . Chest Pain  . Leg Swelling  : HPI: [name] Small is a 25 y.o. right handed female with history significant for end-stage renal disease with hemodialysis due to childhood polycystic kidney disease, severe systolic ejection fraction of 12%, AICD, medical noncompliance and narcotic abuse, pulmonary emboli. Per chart review patient lives alone. Independent with assistive device prior to admission. One level home. She was needing some assistance with ADLs since recent fall. Boyfriend and parents work during the day. Patient with reported recent fall admitted 07/26/2016-08/04/2016 sustaining T7 fracture 18-19 percent, subtle compression deformity superior endplate of T8, probable acute subacute fracture T12. Patient receive follow-up by Dr.  Rolena Infante of orthopedic services. Placed in a TLSO back brace. Discharge to home ambulating with assistive device and home health therapies recommended. Presented 08/13/2016 after missing several recent dialysis treatments due to reported transportation issues. Complaints of increased back pain. Chest x-ray showed basilar atelectasis with small right pleural effusion. Appeared to be a 50% compression fracture of what may represent T12 new since previous thoracic spine CT from 07/27/2016. Findings of fluid overload with follow-up cardiology services received emergent dialysis. Elevated troponin 0.2 felt to be demand ischemia. Follow-up renal services for ongoing dialysis. Physical therapy evaluation completed 08/17/2016 with recommendations of physical medicine rehabilitation consult.Patient was admitted for comprehensive rehabilitation program  ROS Constitutional: Negative for chills and fever.  HENT: Negative for hearing loss.   Respiratory: Positive for shortness of breath. Negative for cough.   Cardiovascular: Positive for palpitations and leg swelling.    Gastrointestinal: Positive for constipation. Negative for nausea and vomiting.       GERD  Musculoskeletal: Positive for joint pain and myalgias.  Skin: Negative for rash.  Neurological: Positive for headaches. Negative for dizziness and tingling.  Psychiatric/Behavioral: Positive for depression.       Anxiety  All other systems reviewed and are negative   Past Medical History:  Diagnosis Date  . AICD (automatic cardioverter/defibrillator) present 12/16/14   [employer]  . Anemia   . Anxiety   . Cardiomyopathy   . Cellulitis and abscess of face 03/22/2013  . CHF (congestive heart failure) ([place])   . Chronic anticoagulation   . Chronic pain   . Depression   . DVT (deep venous thrombosis) ([place]) ~ 2014   BLE  . Dysrhythmia    at times per pt.  . 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 ESRD (end stage renal disease) on dialysis [facility])    "MWF; [place]" (08/15/2016)  . Fracture, thoracic vertebra ([place]) 07/2016   "T7-T12"  . GERD (gastroesophageal reflux disease)   . H/O transfusion of packed red blood cells   . Heart murmur   . Hemodialysis patient ([place])    Mon. Wed. Fri  . Hyperkalemia 09/2015  . Hypertension   . Narcotic abuse, continuous   . Osteoporosis   . Pelvic fracture ([place]) 06/2015   "on the right"  . Polycystic kidney disease   . Pulmonary emboli ([facility]) 01/2012   Bilateral, moderate clot burden, areas of pulmonary infarction and central necrosis  . Renal insufficiency   . Sickle cell anemia ([facility])    Past Surgical History:  Procedure Laterality Date  . AV FISTULA PLACEMENT Bilateral    "right side stopped working & never had it revised" (04/11/2016)  .  AV FISTULA PLACEMENT Right 08/01/2016   Procedure: INSERTION OF ARTERIOVENOUS (AV) GORE-TEX GRAFT RIGHT ARM;  Surgeon: Angelia Mould, MD;  Location: [place];  Service: Vascular;   Laterality: Right;  . [place] REMOVAL Right 08/03/2016   Procedure: REMOVAL OF ARTERIOVENOUS GORETEX GRAFT ([place]);  Surgeon: Waynetta Sandy, MD;  Location: [place];  Service: Vascular;  Laterality: Right;  . CARDIAC CATHETERIZATION    . IMPLANTABLE CARDIOVERTER DEFIBRILLATOR IMPLANT Right 12/2014  . INCISION AND DRAINAGE ABSCESS Right 03/21/2013   Procedure: INCISION AND DRAINAGE RIGHT CHEEK ABSCESS REMOVAL OF FOREIGN BODY;  Surgeon: Izora Gala, MD;  Location: [place];  Service: ENT;  Laterality: Right;  . INSERTION OF DIALYSIS CATHETER Right Mar 09, 2016   thigh, Dr. Lucky Cowboy, [facility]  . 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 NEPHRECTOMY Right     kidney placed in 2008, and body rejected in 2012   . PERIPHERAL VASCULAR CATHETERIZATION Left 03/09/2016   Procedure: A/V Shuntogram/Fistulagram;  Surgeon: Algernon Huxley, MD;  Location: [place] [facility];  Service: Cardiovascular;  Laterality: Left;  . PERIPHERAL VASCULAR CATHETERIZATION N/A 05/09/2016   Procedure: Dialysis/Perma Catheter Removal;  Surgeon: Katha Cabal, MD;  Location: [place] [facility];  Service: Cardiovascular;  Laterality: N/A;  . PERIPHERAL VASCULAR CATHETERIZATION N/A 07/12/2016   Procedure: Dialysis/Perma Catheter Insertion;  Surgeon: Algernon Huxley, MD;  Location: [place] [facility];  Service: Cardiovascular;  Laterality: N/A;  . PERIPHERAL VASCULAR CATHETERIZATION Right 07/31/2016   Procedure: Upper Extremity Venography;  Surgeon: Waynetta Sandy, MD;  Location: [place] [facility];  Service: Cardiovascular;  Laterality: Right;  . REVISON OF ARTERIOVENOUS FISTULA Left 03/22/2016   Procedure: REVISON OF ARTERIOVENOUS FISTULA ( ARTEGRAFT );  Surgeon: Algernon Huxley, MD;  Location: [facility];  Service: Vascular;  Laterality: Left;  . TONSILLECTOMY AND ADENOIDECTOMY  ~ 2000   Family History  Problem Relation Age of Onset  . Polycystic kidney disease Father   . Hypertension Father    Social History:  reports that she quit  smoking about 8 months ago. Her smoking use included Cigarettes. She smoked 0.00 packs per day for 1.00 year. She has never used smokeless tobacco. She reports that she does not drink alcohol or use drugs. Allergies:  Allergies  Allergen Reactions  . Aspirin Other (See Comments)    Interacts with Coreg  . Morphine And Related Itching and Other (See Comments)    Ok with oxycodone   . Tramadol Anaphylaxis  . Vicodin [Hydrocodone-Acetaminophen] Hives  . Buprenorphine Hcl Itching and Other (See Comments)    Ok with oxycodone  . Iohexol Itching   Medications Prior to Admission  Medication Sig Dispense Refill  . calcitonin, salmon, (MIACALCIN) 200 UNIT/ACT nasal spray Place 1 spray into alternate nostrils daily. 3.7 mL 0  . calcitRIOL (ROCALTROL) 0.5 MCG capsule Take 2 capsules (1 mcg total) by mouth every Monday, Wednesday, and Friday with hemodialysis. 60 capsule 0  . carvedilol (COREG) 3.125 MG tablet Take 1 tablet (3.125 mg total) by mouth 2 (two) times daily with a meal. 60 tablet 0  . cinacalcet (SENSIPAR) 30 MG tablet Take 3 tablets (90 mg total) by mouth daily with supper. 90 tablet 0  . colchicine 0.6 MG tablet Take 0.5 tablet (0.3 mg total) by mouth two times per week. (Patient taking differently: Take 0.6 mg by mouth 2 (two) times a week. ) 8 tablet 3  . multivitamin (RENA-VIT) TABS tablet Take 1 tablet by mouth  daily. 90 tablet 3  . naproxen (NAPROSYN) 500 MG tablet Take 1 tablet (500 mg total) by mouth 2 (two) times daily with a meal. 60 tablet 0  . oxyCODONE (ROXICODONE) 15 MG immediate release tablet Take 1 tablet (15 mg total) by mouth every 6 (six) hours as needed. You must try to cut back on the frequency of these pills as your pain improves 30 tablet 0  . sertraline (ZOLOFT) 100 MG tablet Take 1 tablet (100 mg total) by mouth daily. 30 tablet 3  . sevelamer carbonate (RENVELA) 800 MG tablet Take 3 tablets (2,400 mg total) by mouth 3 (three) times daily with meals. 270 tablet 0      Home: Home Living Family/patient expects to be discharged to:: Unsure Living Arrangements: Spouse/significant other (pt and her boyfriend live together in [place]. Parents li) Available Help at Discharge: Family, Available PRN/intermittently Type of Home: Apartment Home Access: Level entry Home Layout: One level Bathroom Shower/Tub: Tub/shower unit, Architectural technologist: Standard Bathroom Accessibility: Yes Home Equipment: Walker - 2 wheels, Bedside commode Additional Comments: Pt sponge bathing since she has a femoreal dialysis catheter  Lives With: Significant other (Boyfriend of 4 years)   Functional History: Prior Function Level of Independence: Independent with assistive device(s), Needs assistance Gait / Transfers Assistance Needed: Requires use of RW for ambulation. Reports her dad had to carry her up the stairs to get into their home. ADL's / Homemaking Assistance Needed: Since fall and spinal fx, assist needed with ADLs from b/f. Not able to perform IADLs. Comments: Pt uses [place] transport to op dialysis  Functional Status:  Mobility: Bed Mobility Overal bed mobility: Needs Assistance Bed Mobility: Rolling, Sidelying to Sit Rolling: Supervision Sidelying to sit: Supervision, HOB elevated General bed mobility comments: Cues for log roll technique, use of rail for support. Able to adjust brace.  Transfers Overall transfer level: Needs assistance Equipment used: Rolling walker (2 wheeled) Transfers: Sit to/from Stand Sit to Stand: Min assist General transfer comment: Assist to stand from EOB with cues for hand placement/technique. Pain through feet. LOB upon standing posteriorly. Ambulation/Gait Ambulation/Gait assistance: Min assist Ambulation Distance (Feet): 20 Feet Assistive device: Rolling walker (2 wheeled) Gait Pattern/deviations: Step-through pattern, Decreased stride length, Trunk flexed, Narrow base of support General Gait Details: Slow, unsteady  gait with bil knee instability. Assist for balance/safety. Trembling in BLEs noted. Cues for breathing. Fatigues through UEs. Gait velocity: decreased Gait velocity interpretation: Below normal speed for age/gender    ADL:    Cognition: Cognition Overall Cognitive Status: Within Functional Limits for tasks assessed Orientation Level: Oriented X4 Cognition Arousal/Alertness: Awake/alert Behavior During Therapy: WFL for tasks assessed/performed Overall Cognitive Status: Within Functional Limits for tasks assessed  Physical Exam: Blood pressure (!) 119/92, pulse 97, temperature 98.1 F (36.7 C), temperature source Oral, resp. rate 18, height _0  (1.6 m), weight 50.8 kg (111 lb 15.9 oz), SpO2 96 %. Physical Exam  Constitutional: She is oriented to person, place, and time. She appears well-developed.  Slight build  HENT:  Head: Normocephalic and atraumatic.  Right Ear: External ear normal.  Left Ear: External ear normal.  Eyes: Conjunctivae are normal. Pupils are equal, round, and reactive to light.  Neck: No tracheal deviation present. No thyromegaly present.  Cardiovascular: Normal rate, regular rhythm and intact distal pulses.  Exam reveals no gallop and no friction rub.   No murmur heard. Respiratory: No respiratory distress. She has no wheezes. She has no rales. She exhibits no tenderness.  GI: She exhibits no distension. There is no tenderness. There is no rebound.  Musculoskeletal: She exhibits no edema.  Wearing TLSO which is fitting appropriately  Neurological: She is oriented to person, place, and time. No cranial nerve deficit. Coordination normal.  Strength 4/4 UE prox to distal. 4-HF with pain inhibition. KE and ADF/PF 4/5. No sensory abnormalities.   Skin: No rash noted. No erythema.      Results for orders placed or performed during the hospital encounter of 08/12/16 (from the past 48 hour(s))  Renal function panel     Status: Abnormal   Collection Time:  08/18/16  7:26 AM  Result Value Ref Range   Sodium 141 135 - 145 mmol/L   Potassium 4.0 3.5 - 5.1 mmol/L   Chloride 102 101 - 111 mmol/L   CO2 25 22 - 32 mmol/L   Glucose, Bld 88 65 - 99 mg/dL   BUN 50 (H) 6 - 20 mg/dL   Creatinine, Ser 5.79 (H) 0.44 - 1.00 mg/dL   Calcium 9.8 8.9 - 10.3 mg/dL   Phosphorus 5.4 (H) 2.5 - 4.6 mg/dL   Albumin 2.8 (L) 3.5 - 5.0 g/dL   GFR calc non Af Amer 9 (L) >60 mL/min   GFR calc Af Amer 11 (L) >60 mL/min    Comment: (NOTE) The eGFR has been calculated using the CKD EPI equation. This calculation has not been validated in all clinical situations. eGFR's persistently <60 mL/min signify possible Chronic Kidney Disease.    Anion gap 14 5 - 15  CBC     Status: Abnormal   Collection Time: 08/18/16  7:26 AM  Result Value Ref Range   WBC 9.4 4.0 - 10.5 K/uL   RBC 3.20 (L) 3.87 - 5.11 MIL/uL   Hemoglobin 8.8 (L) 12.0 - 15.0 g/dL   HCT 28.1 (L) 36.0 - 46.0 %   MCV 87.8 78.0 - 100.0 fL   MCH 27.5 26.0 - 34.0 pg   MCHC 31.3 30.0 - 36.0 g/dL   RDW 19.7 (H) 11.5 - 15.5 %   Platelets 249 150 - 400 K/uL  Iron and TIBC     Status: Abnormal   Collection Time: 08/18/16  8:29 AM  Result Value Ref Range   Iron 17 (L) 28 - 170 ug/dL   TIBC 158 (L) 250 - 450 ug/dL   Saturation Ratios 11 10.4 - 31.8 %   UIBC 141 ug/dL   No results found.     Medical Problem List and Plan: 1.  Weakness and functional deficits due to thoracic compression fractures, T7-T8 secondary to recent fall. Back brace when out of bed  -admit to inpatient rehab 2.  DVT Prophylaxis/Anticoagulation: SCD.Check vascular study 3. Pain Management with a history of narcotic abuse: Naprosyn 500 mg BID, Oxycodone and Robaxin as needed 4. Mood: Zoloft 100 mg daily 5. Neuropsych: This patient is capable of making decisions on her own behalf. 6. Skin/Wound Care: Routine skin checks 7. Fluids/Electrolytes/Nutrition: Routine I&O with follow-up chemistries on admit 8.ESRD due to childhood  polycystic kidney disease with history of noncompliance to dialysis. 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 Acute on chronic anemia. Follow-up CBC 11. Medical noncompliance. Counseling   Post Admission Physician Evaluation: 1. Functional deficits secondary  to thoracic compression fractures. 2. Patient is admitted to receive collaborative, interdisciplinary care between the physiatrist, rehab nursing staff, and therapy team. 3. Patient's level of medical complexity and substantial therapy needs in context of that medical necessity cannot be provided at  a lesser intensity of care such as a SNF. 4. Patient has experienced substantial functional loss from his/her baseline which was documented above under the "Functional History" and "Functional Status" headings.  Judging by the patient's diagnosis, physical exam, and functional history, the patient has potential for functional progress which will result in measurable gains while on inpatient rehab.  These gains will be of substantial and practical use upon discharge  in facilitating mobility and self-care at the household level. 5. Physiatrist will provide 24 hour management of medical needs as well as oversight of the therapy plan/treatment and provide guidance as appropriate regarding the interaction of the two. 6. 24 hour rehab nursing will assist with bladder management, bowel management, safety, skin/wound care, disease management, medication administration, pain management and patient education  and help integrate therapy concepts, techniques,education, etc. 7. PT will assess and treat for/with: Lower extremity strength, range of motion, stamina, balance, functional mobility, safety, adaptive techniques and equipment, pain mgt, spinal precautions, community reintegration.   Goals are: mod I. 8. OT will assess and treat for/with: ADL's, functional mobility, safety, upper extremity strength, adaptive techniques and equipment, pain mgt,  spinal precautions, ego support, pain control.   Goals are: mod I to set up. Therapy may proceed with showering this patient. 9. SLP will assess and treat for/with: n/a.  Goals are: n/a. 10. Case Management and Social Worker will assess and treat for psychological issues and discharge planning. 11. Team conference will be held weekly to assess progress toward goals and to determine barriers to discharge. 12. Patient will receive at least 3 hours of therapy per day at least 5 days per week. 13. ELOS: 7-10 days       14. Prognosis:  excellent     Meredith Staggers, MD, [facility] 08/18/2016  08/18/2016

## 2016-08-18 NOTE — Progress Notes (Signed)
Subjective: Interval History: has complaints back pain.  Objective: Vital signs in last 24 hours: Temp:  [97.8 F (36.6 C)-98.7 F (37.1 C)] 97.8 F (36.6 C) (10/27 0700) Pulse Rate:  [88-106] 101 (10/27 0730) Resp:  [16-19] 18 (10/27 0730) BP: (121-131)/(92-100) 121/97 (10/27 0730) SpO2:  [96 %-100 %] 96 % (10/27 0700) Weight:  [52.6 kg (115 lb 15.4 oz)-54.8 kg (120 lb 13 oz)] 54.8 kg (120 lb 13 oz) (10/27 0700) Weight change: 0.3 kg (10.6 oz)  Intake/Output from previous day: 10/26 0701 - 10/27 0700 In: 33 [P.O.:30; I.V.:3] Out: -  Intake/Output this shift: No intake/output data recorded.  General appearance: cooperative and no distress Resp: diminished breath sounds bilaterally Chest wall: brace Cardio: S1, S2 normal, systolic murmur: holosystolic 2/6, blowing at apex and friction rub heard at apex GI: liver down 4 cm Extremities: L fem cath  Lab Results:  Recent Labs  08/16/16 0700 08/18/16 0726  WBC 9.5 9.4  HGB 9.1* 8.8*  HCT 29.3* 28.1*  PLT 241 249   BMET:  Recent Labs  08/16/16 0700 08/18/16 0726  NA 140 141  K 4.1 4.0  CL 101 102  CO2 26 25  GLUCOSE 79 88  BUN 35* 50*  CREATININE 5.07* 5.79*  CALCIUM 9.5 9.8   No results for input(s): PTH in the last 72 hours. Iron Studies: No results for input(s): IRON, TIBC, TRANSFERRIN, FERRITIN in the last 72 hours.  Studies/Results: No results found.  I have reviewed the patient's current medications.  Assessment/Plan: 1 ESRD Hd today, lower solute,  2 Pericarditis  Improving 3 Anemia lower follow, cont esa 4 HPTH  Vit D, cinn 5 comp fx per primary 6 NONADHERENCE has caused most of issues P HD, esa, PT    LOS: 5 days   Arnetta Odeh L 08/18/2016,8:02 AM

## 2016-08-18 NOTE — Progress Notes (Signed)
Erick Colace, MD Physician Signed Physical Medicine and Rehabilitation  Consult Note Date of Service: 08/17/2016 12:06 PM  Related encounter: ED to Hosp-Admission (Current) from 08/12/2016 in [facility]     Expand All Collapse All   [] Hide copied text [] Hover for attribution information      Physical Medicine and Rehabilitation Consult Reason for Consult: Debilitation related to recent fall T7, 8, 12 fracture/multi-medical Referring Physician: Dr. Arbutus Leas   HPI: [name] Small is a 25 y.o. right handed female with history significant for end-stage renal disease with hemodialysis due to childhood polycystic kidney disease, severe systolic ejection fraction of 16%, AICD, medical noncompliance and narcotic abuse, pulmonary emboli. Per chart review patient lives alone. Independent with assistive device prior to admission. One level home. She was needing some assistance with ADLs since recent fall. Boyfriend and parents work during the day. Patient with reported recent fall admitted 07/26/2016-08/04/2016 sustaining T7 fracture 18-19 percent, subtle compression deformity superior endplate of T8, probable acute subacute fracture T12. Patient receive follow-up by Dr.  Shon Baton of orthopedic services. Placed in a TLSO back brace. Discharge to home ambulating with assistive device and home health therapies recommended. Presented 08/13/2016 after missing several recent dialysis treatments due to reported transportation issues. Complaints of increased back pain. Chest x-ray showed basilar atelectasis with small right pleural effusion. Appeared to be a 50% compression fracture of what may represent T12 new since previous thoracic spine CT from 07/27/2016. Findings of fluid overload with follow-up cardiology services received emergent dialysis. Elevated troponin 0.2 felt to be demand ischemia. Follow-up renal services for ongoing dialysis. Physical therapy evaluation completed  08/17/2016 with recommendations of physical medicine rehabilitation consult.  Patient has been walking with a walker since she suffered a pelvic fracture, pubic ramus on the right side in 2016 Patient does get some shortness of breath with ambulation and reports poor endurance  She lives by herself but has a boyfriend, in on the weekends. He works out of town. Her parents can stop by after work.  Review of Systems  Constitutional: Negative for chills and fever.  HENT: Negative for hearing loss.   Respiratory: Positive for shortness of breath. Negative for cough.   Cardiovascular: Positive for palpitations and leg swelling.  Gastrointestinal: Positive for constipation. Negative for nausea and vomiting.       GERD  Musculoskeletal: Positive for joint pain and myalgias.  Skin: Negative for rash.  Neurological: Positive for headaches. Negative for dizziness and tingling.  Psychiatric/Behavioral: Positive for depression.       Anxiety  All other systems reviewed and are negative.      Past Medical History:  Diagnosis Date  . 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 Anemia   . Anxiety   . Cardiomyopathy   . Cellulitis and abscess of face 03/22/2013  . CHF (congestive heart failure) (HCC)   . Chronic anticoagulation   . Chronic pain   . Depression   . DVT (deep venous thrombosis) (HCC) ~ 2014   BLE  . Dysrhythmia    at times per pt.  . 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 ESRD (end stage renal disease) on dialysis [facility])    "MWF; [facility]" (08/15/2016)  . Fracture, thoracic vertebra (HCC) 07/2016   "T7-T12"  . GERD (gastroesophageal reflux disease)   . H/O transfusion of packed red blood  cells   . Heart murmur   . Hemodialysis patient (HCC)    Mon. Wed. Fri  . Hyperkalemia 09/2015  .  Hypertension   . Narcotic abuse, continuous   . Osteoporosis   . Pelvic fracture (HCC) 06/2015   "on the right"  . Polycystic kidney disease   . Pulmonary emboli (HCC) 01/2012   Bilateral, moderate clot burden, areas of pulmonary infarction and central necrosis  . Renal insufficiency   . Sickle cell anemia (HCC)         Past Surgical History:  Procedure Laterality Date  . AV FISTULA PLACEMENT Bilateral    "right side stopped working & never had it revised" (04/11/2016)  . AV FISTULA PLACEMENT Right 08/01/2016   Procedure: INSERTION OF ARTERIOVENOUS (AV) GORE-TEX GRAFT RIGHT ARM;  Surgeon: Chuck Hint, MD;  Location: [facility] OR;  Service: Vascular;  Laterality: Right;  . AVGG REMOVAL Right 08/03/2016   Procedure: REMOVAL OF ARTERIOVENOUS GORETEX GRAFT (AVGG);  Surgeon: Maeola Harman, MD;  Location: [facility] OR;  Service: Vascular;  Laterality: Right;  . CARDIAC CATHETERIZATION    . IMPLANTABLE CARDIOVERTER DEFIBRILLATOR IMPLANT Right 12/2014  . INCISION AND DRAINAGE ABSCESS Right 03/21/2013   Procedure: INCISION AND DRAINAGE RIGHT CHEEK ABSCESS REMOVAL OF FOREIGN BODY;  Surgeon: Serena Colonel, MD;  Location: [facility] OR;  Service: ENT;  Laterality: Right;  . INSERTION OF DIALYSIS CATHETER Right Mar 09, 2016   thigh, Dr. Wyn Quaker, [facility]  . 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 NEPHRECTOMY Right     kidney placed in 2008, and body rejected in 2012   . PERIPHERAL VASCULAR CATHETERIZATION Left 03/09/2016   Procedure: A/V Shuntogram/Fistulagram;  Surgeon: Annice Needy, MD;  Location: [facility] INVASIVE CV LAB;  Service: Cardiovascular;  Laterality: Left;  . PERIPHERAL VASCULAR CATHETERIZATION N/A 05/09/2016   Procedure: Dialysis/Perma Catheter Removal;  Surgeon: Renford Dills, MD;  Location: [facility] INVASIVE CV LAB;  Service: Cardiovascular;  Laterality: N/A;  . PERIPHERAL VASCULAR CATHETERIZATION N/A 07/12/2016   Procedure: Dialysis/Perma Catheter Insertion;  Surgeon: Annice Needy,  MD;  Location: [facility] INVASIVE CV LAB;  Service: Cardiovascular;  Laterality: N/A;  . PERIPHERAL VASCULAR CATHETERIZATION Right 07/31/2016   Procedure: Upper Extremity Venography;  Surgeon: Maeola Harman, MD;  Location: [facility] INVASIVE CV LAB;  Service: Cardiovascular;  Laterality: Right;  . REVISON OF ARTERIOVENOUS FISTULA Left 03/22/2016   Procedure: REVISON OF ARTERIOVENOUS FISTULA ( ARTEGRAFT );  Surgeon: Annice Needy, MD;  Location: [facility] ORS;  Service: Vascular;  Laterality: Left;  . TONSILLECTOMY AND ADENOIDECTOMY  ~ 2000        Family History  Problem Relation Age of Onset  . Polycystic kidney disease Father   . Hypertension Father    Social History:  reports that she quit smoking about 8 months ago. Her smoking use included Cigarettes. She smoked 0.00 packs per day for 1.00 year. She has never used smokeless tobacco. She reports that she does not drink alcohol or use drugs. Allergies:       Allergies  Allergen Reactions  . Aspirin Other (See Comments)    Interacts with Coreg  . Morphine And Related Itching and Other (See Comments)    Ok with oxycodone  . Tramadol Anaphylaxis  . Vicodin [Hydrocodone-Acetaminophen] Hives  . Buprenorphine Hcl Itching and Other (See Comments)    Ok with oxycodone  . Iohexol Itching         Medications Prior to Admission  Medication Sig Dispense Refill  .  calcitonin, salmon, (MIACALCIN) 200 UNIT/ACT nasal spray Place 1 spray into alternate nostrils daily. 3.7 mL 0  . calcitRIOL (ROCALTROL) 0.5 MCG capsule Take 1 capsule (0.5 mcg total) by mouth every Monday, Wednesday, and Friday with hemodialysis. 30 capsule 0  . cinacalcet (SENSIPAR) 30 MG tablet Take 1 tablet (30 mg total) by mouth 2 (two) times daily with a meal. 60 tablet 0  . colchicine 0.6 MG tablet Take 0.5 tablet (0.3 mg total) by mouth two times per week. (Patient taking differently: Take 0.6 mg by mouth 2 (two) times a week. ) 8 tablet 3  . famotidine (PEPCID) 20 MG  tablet Take 1 tablet (20 mg total) by mouth 2 (two) times daily as needed for heartburn or indigestion. 60 tablet 0  . LORazepam (ATIVAN) 1 MG tablet Take 1 tablet (1 mg total) by mouth daily as needed for anxiety. (Patient taking differently: Take 1 mg by mouth every morning. ) 30 tablet 0  . multivitamin (RENA-VIT) TABS tablet Take 1 tablet by mouth daily. 90 tablet 3  . naproxen (NAPROSYN) 500 MG tablet Take 1 tablet (500 mg total) by mouth 2 (two) times daily with a meal. 60 tablet 0  . oxyCODONE (ROXICODONE) 15 MG immediate release tablet Take 1 tablet (15 mg total) by mouth every 6 (six) hours as needed. You must try to cut back on the frequency of these pills as your pain improves 30 tablet 0  . sertraline (ZOLOFT) 100 MG tablet Take 1 tablet (100 mg total) by mouth daily. 30 tablet 3  . sevelamer carbonate (RENVELA) 800 MG tablet Take 2-3 tablets (1,600-2,400 mg total) by mouth 3 (three) times daily with meals. 3 tabs with meals, 2 tabs with snacks (Patient taking differently: Take 1,600-2,400 mg by mouth See admin instructions. 2,400 mg three times a day with each meal and 1,600 mg with snacks) 120 tablet 3    Home: Home Living Family/patient expects to be discharged to:: Unsure Living Arrangements: Parent Available Help at Discharge: Family, Available PRN/intermittently (bf and parents work during the day) Type of Home: Apartment Home Access: Level entry Home Layout: One level Home Equipment: Environmental consultantWalker - 2 wheels, Bedside commode  Functional History: Prior Function Level of Independence: Independent with assistive device(s), Needs assistance Gait / Transfers Assistance Needed: Requires use of RW for ambulation. Reports her dad had to carry her up the stairs to get into their home. ADL's / Homemaking Assistance Needed: Since fall and spinal fx, assist needed with ADLs from b/f. Not able to perform IADLs. Comments: Patient uses public transportation to HD.   Functional Status:    Mobility: Bed Mobility Overal bed mobility: Needs Assistance Bed Mobility: Rolling, Sidelying to Sit Rolling: Supervision Sidelying to sit: Supervision, HOB elevated General bed mobility comments: Cues for log roll technique, use of rail for support. Able to adjust brace.  Transfers Overall transfer level: Needs assistance Equipment used: Rolling walker (2 wheeled) Transfers: Sit to/from Stand Sit to Stand: Min assist General transfer comment: Assist to stand from EOB with cues for hand placement/technique. Pain through feet. LOB upon standing posteriorly. Ambulation/Gait Ambulation/Gait assistance: Min assist Ambulation Distance (Feet): 20 Feet Assistive device: Rolling walker (2 wheeled) Gait Pattern/deviations: Step-through pattern, Decreased stride length, Trunk flexed, Narrow base of support General Gait Details: Slow, unsteady gait with bil knee instability. Assist for balance/safety. Trembling in BLEs noted. Cues for breathing. Fatigues through UEs. Gait velocity: decreased Gait velocity interpretation: Below normal speed for age/gender    ADL:  Cognition: Cognition Overall Cognitive Status: Within Functional Limits for tasks assessed Orientation Level: Oriented X4 Cognition Arousal/Alertness: Awake/alert Behavior During Therapy: WFL for tasks assessed/performed Overall Cognitive Status: Within Functional Limits for tasks assessed  Blood pressure (!) 122/95, pulse (!) 106, temperature 98.7 F (37.1 C), temperature source Oral, resp. rate 16, height 5\' 3"  (1.6 m), weight 52.3 kg (115 lb 4.8 oz), SpO2 100 %. Physical Exam  HENT:  Head: Normocephalic.  Eyes: EOM are normal.  Neck: Normal range of motion. Neck supple. No thyromegaly present.  Cardiovascular: Normal rate and regular rhythm.   Respiratory: Effort normal and breath sounds normal. No respiratory distress.  GI: Soft. Bowel sounds are normal. She exhibits no distension.  Neurological:  Patient is  sitting up on a bed talking on cell phone. Back brace in place. Alert and oriented 3. Follows commands.  Skin: Skin is warm and dry.  Motor strength is 3 minus related to pain in the right deltoid, 4 at the grip. 3 minus related to pain at the biceps, triceps Left upper extremity 5/5 in the deltoid, biceps, triceps, grip Lower extremity strength is 4 minus at the hip flexor, knee extensors, ankle dorsiflexors. There is edema bilateral feet. She does have sensation to light touch on both feet  Lab Results Last 24 Hours  No results found for this or any previous visit (from the past 24 hour(s)).   Imaging Results (Last 48 hours)  No results found.    Assessment/Plan: Diagnosis: Deconditioning and thoracic compression fractures, T7/T8 without spinal cord injury 1. Does the need for close, 24 hr/day medical supervision in concert with the patient's rehab needs make it unreasonable for this patient to be served in a less intensive setting? Potentially 2. Co-Morbidities requiring supervision/potential complications: End-stage renal disease, cardiomyopathy 3. Due to bladder management, bowel management, safety, skin/wound care, disease management, medication administration, pain management and patient education, does the patient require 24 hr/day rehab nursing? Yes 4. Does the patient require coordinated care of a physician, rehab nurse, PT (1-2 hrs/day, 5 days/week) and OT (1-2 hrs/day, 5 days/week) to address physical and functional deficits in the context of the above medical diagnosis(es)? Yes Addressing deficits in the following areas: balance, endurance, locomotion, strength, transferring, bowel/bladder control, bathing, dressing, toileting and psychosocial support 5. Can the patient actively participate in an intensive therapy program of at least 3 hrs of therapy per day at least 5 days per week? Yes 6. The potential for patient to make measurable gains while on inpatient rehab is  good 7. Anticipated functional outcomes upon discharge from inpatient rehab are modified independent and supervision  with PT, modified independent and supervision with OT, n/a with SLP. 8. Estimated rehab length of stay to reach the above functional goals is: 7-10d 9. Does the patient have adequate social supports and living environment to accommodate these discharge functional goals? Yes 10. Anticipated D/C setting: Home 11. Anticipated post D/C treatments: HH therapy 12. Overall Rehab/Functional Prognosis: good  RECOMMENDATIONS: This patient's condition is appropriate for continued rehabilitative care in the following setting: CIR Patient has agreed to participate in recommended program. Potentially Note that insurance prior authorization may be required for reimbursement for recommended care.  Comment: Patient will discuss possible CIR stay with her parents, we discussed other options include home with home health, but 24 /7 care    08/17/2016    Revision History  Routing History

## 2016-08-18 NOTE — Progress Notes (Signed)
Standley Brooking, RN Rehab Admission Coordinator Signed Physical Medicine and Rehabilitation  PMR Pre-admission Date of Service: 08/18/2016 11:51 AM  Related encounter: ED to Hosp-Admission (Current) from 08/12/2016 in [facility]       [] Hide copied text PMR Admission Coordinator Pre-Admission Assessment  Patient: [name] Small is an 25 y.o., female MRN: [id number] DOB: [birth year] Height: 5\' 3"  (160 cm) Weight: 54.8 kg (120 lb 13 oz)                                                                                                                                                  Insurance Information HMO:     PPO:      PCP:      IPA:      80/20: yes     OTHER: no HMO PRIMARY: Medicare a and b      Policy#: [id number]      Subscriber: pt Benefits:  Phone #: passport one online     Name: 08/18/2016 Eff. Date: 09/23/2011     Deduct: $1316      Out of Pocket Max: none      Life Max: none CIR: 100%      SNF: 20 full days Outpatient: 80%     Co-Pay: 20% Home Health: 100%      Co-Pay: none DME: 80%  Co-Pay: 20% Providers: pt choice  SECONDARY: [employer]      Policy#: [id number]      Subscriber: Mom Secondary verified 10/27 to Medicare. No PAC required   Third: Medicaid  [employer] policy: [id number]  Medicaid Application Date:       Case Manager:  Disability Application Date:       Case Worker:   Emergency Contact Information        Contact Information    Name Relation Home Work Mobile   Peters[place] Mother   [phone]   [place], [place] Father   [phone]     Current Medical History  Patient Admitting Diagnosis:  Deconditioning and thoracic compression fractures, T7 and T8 without spinal cord injury  History of Present Illness:        : HPI: [name] Carteris a 25 y.o.right handed femalewith history significant for end-stage renal disease with hemodialysis due to childhood polycystic kidney disease, severe systolic  ejection fraction of 12%, AICD,medicalnoncomplianceand narcotic abuse, pulmonary emboli. Boyfriend and parents work during the day.Patient with reported recent fall admitted 07/26/2016-08/04/2016 sustaining T7 fracture 18-19 percent, subtle compression deformity superior endplate of T8, probable acute subacute fracture T12. Patient receive follow-up by Dr. Shon Baton of orthopedic services. Placed in a TLSO back brace. Discharge to home ambulating with assistive device and home health therapies recommended. Presented 08/13/2016 after missing several recent dialysis treatments due to reported transportation issues. Complaints of increased back pain. Chest x-ray showed basilar atelectasis with small right  pleural effusion. Appeared to be a 50% compression fracture of what may represent T12 new since previous thoracic spine CT from 07/27/2016. Findings of fluid overload with follow-up cardiology services received emergent dialysis. Elevated troponin 0.2 felt to be demand ischemia. Follow-up renal services for ongoing dialysis.   Past Medical History      Past Medical History:  Diagnosis Date  . AICD (automatic cardioverter/defibrillator) present 12/16/14   [employer]  . Anemia   . Anxiety   . Cardiomyopathy   . Cellulitis and abscess of face 03/22/2013  . CHF (congestive heart failure) (HCC)   . Chronic anticoagulation   . Chronic pain   . Depression   . DVT (deep venous thrombosis) (HCC) ~ 2014   BLE  . Dysrhythmia    at times per pt.  . 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 ESRD (end stage renal disease) on dialysis [facility](HCC)    "MWF; [facility]" (08/15/2016)  . Fracture, thoracic vertebra (HCC) 07/2016   "T7-T12"  . GERD (gastroesophageal reflux disease)   . H/O transfusion of packed red blood cells   . Heart murmur   . Hemodialysis patient  (HCC)    Mon. Wed. Fri  . Hyperkalemia 09/2015  . Hypertension   . Narcotic abuse, continuous   . Osteoporosis   . Pelvic fracture (HCC) 06/2015   "on the right"  . Polycystic kidney disease   . Pulmonary emboli (HCC) 01/2012   Bilateral, moderate clot burden, areas of pulmonary infarction and central necrosis  . Renal insufficiency   . Sickle cell anemia (HCC)     Family History  family history includes Hypertension in her father; Polycystic kidney disease in her father.  Prior Rehab/Hospitalizations:  Has the patient had major surgery during 100 days prior to admission? No  Current Medications   Current Facility-Administered Medications:  .  0.9 %  sodium chloride infusion, 250 mL, Intravenous, PRN, Haydee Monicaachal A David, MD .  0.9 %  sodium chloride infusion, 100 mL, Intravenous, PRN, Beryle LatheJames Deterding, MD .  0.9 %  sodium chloride infusion, 100 mL, Intravenous, PRN, Beryle LatheJames Deterding, MD .  alteplase (CATHFLO ACTIVASE) injection 2 mg, 2 mg, Intracatheter, Once PRN, Beryle LatheJames Deterding, MD .  calcitonin (salmon) (MIACALCIN/FORTICAL) nasal spray 1 spray, 1 spray, Alternating Nares, Daily, Haydee Monicaachal A David, MD .  calcitRIOL (ROCALTROL) capsule 1 mcg, 1 mcg, Oral, Q M,W,F-HD, Beryle LatheJames Deterding, MD, 1 mcg at 08/18/16 [phone] .  cinacalcet (SENSIPAR) tablet 90 mg, 90 mg, Oral, Q supper, Beryle LatheJames Deterding, MD, 90 mg at 08/17/16 1702 .  Darbepoetin Alfa (ARANESP) injection 100 mcg, 100 mcg, Intravenous, Q Tue-HD, Beryle LatheJames Deterding, MD, 100 mcg at 08/15/16 1109 .  diphenhydrAMINE (BENADRYL) capsule 50 mg, 50 mg, Oral, Q6H PRN, Megan Mansgechi Grace Ejigiri, PA-C, 50 mg at 08/16/16 0725 .  famotidine (PEPCID) tablet 20 mg, 20 mg, Oral, BID PRN, Haydee Monicaachal A David, MD, 20 mg at 08/17/16 [id number] .  heparin injection 1,000 Units, 1,000 Units, Dialysis, PRN, Beryle LatheJames Deterding, MD .  HYDROmorphone (DILAUDID) injection 2 mg, 2 mg, Intravenous, Q4H PRN, Vassie Lollarlos Madera, MD, 2 mg at 08/18/16 0942 .  lidocaine (PF) (XYLOCAINE)  1 % injection 5 mL, 5 mL, Intradermal, PRN, Beryle LatheJames Deterding, MD .  lidocaine-prilocaine (EMLA) cream 1 application, 1 application, Topical, PRN, Beryle LatheJames Deterding, MD .  methocarbamol (ROBAXIN) tablet 500 mg, 500 mg, Oral, Q8H PRN, Vassie Lollarlos Madera, MD, 500 mg at  08/18/16 0012 .  multivitamin (RENA-VIT) tablet 1 tablet, 1 tablet, Oral, QHS, Haydee Monica, MD, 1 tablet at 08/18/16 0012 .  naproxen (NAPROSYN) tablet 500 mg, 500 mg, Oral, BID WC, Haydee Monica, MD, 500 mg at 08/17/16 1702 .  ondansetron (ZOFRAN) tablet 4 mg, 4 mg, Oral, Q6H PRN **OR** ondansetron (ZOFRAN) injection 4 mg, 4 mg, Intravenous, Q6H PRN, Haydee Monica, MD .  oxyCODONE (Oxy IR/ROXICODONE) immediate release tablet 15 mg, 15 mg, Oral, Q6H PRN, Haydee Monica, MD, 15 mg at 08/17/16 1154 .  pentafluoroprop-tetrafluoroeth (GEBAUERS) aerosol 1 application, 1 application, Topical, PRN, Beryle Lathe, MD .  sertraline (ZOLOFT) tablet 100 mg, 100 mg, Oral, Daily, Haydee Monica, MD, 100 mg at 08/17/16 0804 .  sevelamer carbonate (RENVELA) tablet 2,400 mg, 2,400 mg, Oral, TID WC, Lenny Pastel, PA-C, 2,400 mg at 08/17/16 1702 .  sodium chloride flush (NS) 0.9 % injection 3 mL, 3 mL, Intravenous, Q12H, Haydee Monica, MD, 3 mL at 08/14/16 2000 .  sodium chloride flush (NS) 0.9 % injection 3 mL, 3 mL, Intravenous, Q12H, Haydee Monica, MD, 3 mL at 08/18/16 0013 .  sodium chloride flush (NS) 0.9 % injection 3 mL, 3 mL, Intravenous, PRN, Haydee Monica, MD  Patients Current Diet: Diet renal with fluid restriction Fluid restriction: 1200 mL Fluid; Room service appropriate? Yes; Fluid consistency: Thin  Precautions / Restrictions Precautions Precautions: Fall, Back Precaution Booklet Issued: No Spinal Brace: Thoracolumbosacral orthotic Restrictions Weight Bearing Restrictions: No   Has the patient had 2 or more falls or a fall with injury in the past year?No  Prior Activity Level Limited Community (1-2x/wk): does not drive;  uses RW due to pelvix fracture a year ago  Journalist, newspaper / Equipment Home Assistive Devices/Equipment: Environmental consultant (specify type), Brace (specify type) Home Equipment: Walker - 2 wheels, Bedside commode  Prior Device Use: Indicate devices/aids used by the patient prior to current illness, exacerbation or injury? Walker Used walker since pelvic fx last year  Prior Functional Level Prior Function Level of Independence: Independent with assistive device(s), Needs assistance Gait / Transfers Assistance Needed: Requires use of RW for ambulation. Reports her dad had to carry her up the stairs to get into their home. ADL's / Homemaking Assistance Needed: Since fall and spinal fx, assist needed with ADLs from b/f. Not able to perform IADLs. Comments: Pt uses [place] transport to op dialysis  Self Care: Did the patient need help bathing, dressing, using the toilet or eating?  Independent was Mod I with RW until recent thoracic fxs  Indoor Mobility: Did the patient need assistance with walking from room to room (with or without device)? Needed some help  Stairs: Did the patient need assistance with internal or external stairs (with or without device)? Needed some help  Functional Cognition: Did the patient need help planning regular tasks such as shopping or remembering to take medications? Independent  Current Functional Level Cognition Overall Cognitive Status: Within Functional Limits for tasks assessed Orientation Level: Oriented X4    Extremity Assessment (includes Sensation/Coordination) Upper Extremity Assessment: Defer to OT evaluation, Generalized weakness  Lower Extremity Assessment: Generalized weakness, RLE deficits/detail, LLE deficits/detail (difficult to assess due to pain but trembling noted during standing and functional tasks.) RLE Sensation: decreased light touch (tingling reported and burning.) LLE Sensation: decreased light touch (tingling reported and burning.)    ADLs     Mobility Overal bed mobility: Needs Assistance Bed Mobility: Rolling, Sidelying to Sit Rolling: Supervision Sidelying  to sit: Supervision, HOB elevated General bed mobility comments: Cues for log roll technique, use of rail for support. Able to adjust brace.    Transfers Overall transfer level: Needs assistance Equipment used: Rolling walker (2 wheeled) Transfers: Sit to/from Stand Sit to Stand: Min assist General transfer comment: Assist to stand from EOB with cues for hand placement/technique. Pain through feet. LOB upon standing posteriorly.   Ambulation / Gait / Stairs / Wheelchair Mobility Ambulation/Gait Ambulation/Gait assistance: Architect (Feet): 20 Feet Assistive device: Rolling walker (2 wheeled) Gait Pattern/deviations: Step-through pattern, Decreased stride length, Trunk flexed, Narrow base of support General Gait Details: Slow, unsteady gait with bil knee instability. Assist for balance/safety. Trembling in BLEs noted. Cues for breathing. Fatigues through UEs. Gait velocity: decreased Gait velocity interpretation: Below normal speed for age/gender   Posture / Balance Dynamic Sitting Balance Sitting balance - Comments: Able to adjust brace sitting EOB without difficulty.  Balance Overall balance assessment: Needs assistance Sitting-balance support: Feet supported, No upper extremity supported Sitting balance-Leahy Scale: Fair Sitting balance - Comments: Able to adjust brace sitting EOB without difficulty.  Standing balance support: During functional activity, Bilateral upper extremity supported Standing balance-Leahy Scale: Poor Standing balance comment: Reliant on BUEs for support and close Min guard with 1 instance of posterior LOB.   Special needs/care consideration BiPAP/CPAP   N/a CPM   N/a Continuous Drip IV   N/a Dialysis Monday, Wednesday Friday was receiving OP HD in [place], but recent change to [facility]. Has not been to  that center yet Life Vest  N/a Oxygen  N/a Special Bed  N/a Trach Size  N/a Wound Vac (area) n/a Skin left femoral hemodialysis catheter Bowel mgmt: continent LBM 10/26 Bladder mgmt: anuric due to ESRD Diabetic mgmt  N/a   Previous Home Environment Living Arrangements: Spouse/significant other (pt and her boyfriend live together in [place]. Parents li)  Lives With: Significant other (Boyfriend of 4 years) Available Help at Discharge: Family, Available PRN/intermittently Type of Home: Apartment Home Layout: One level Home Access: Level entry Bathroom Shower/Tub: Tub/shower unit, Engineer, building services: Standard Bathroom Accessibility: Yes How Accessible: Accessible via walker Home Care Services: Yes Type of Home Care Services: Home RN Home Care Agency (if known): [facility] Additional Comments: Pt sponge bathing since she has a femoreal dialysis catheter  Discharge Living Setting Plans for Discharge Living Setting: Lives with (comment), Apartment (boyfriend) Type of Home at Discharge: Apartment Discharge Home Layout: One level Discharge Home Access: Level entry Discharge Bathroom Shower/Tub: Tub/shower unit, Curtain Discharge Bathroom Toilet: Standard Discharge Bathroom Accessibility: Yes How Accessible: Accessible via walker Does the patient have any problems obtaining your medications?: No  Social/Family/Support Systems Patient Roles: Partner Contact Information: Bernestine Amass, Mom Anticipated Caregiver: Mom and Boyfriend intermittently Anticipated Caregiver's Contact Information: see above Ability/Limitations of Caregiver: Mom works days close to [employer] apartment; can she in intermittently throughout the workday on pt; Boyfriend delivers out of town for work and may be gone a week at a time Medical laboratory scientific officer: Intermittent Discharge Plan Discussed with Primary Caregiver: Yes Is Caregiver In Agreement with Plan?: Yes Does Caregiver/Family have Issues  with Lodging/Transportation while Pt is in Rehab?: No  Goals/Additional Needs Patient/Family Goal for Rehab: Mod I to supervision with PT, OT Expected length of stay: ELOS 7- 10 days Special Service Needs: ESRD on OP hemodilaysis M, W, F. Was receiving OP in [place]. [place] [facility] Additional Information: Pt has been noncomliant to OP hemodialyis  due to changing centers, transportation, etc Pt/Family Agrees to Admission and willing to participate: Yes Program Orientation Provided & Reviewed with Pt/Caregiver Including Roles  & Responsibilities: Yes  Decrease burden of Care through IP rehab admission: n/a  Possible need for SNF placement upon discharge: not anticipated  Patient Condition: This patient's condition remains as documented in the consult dated 08/17/2016, in which the Rehabilitation Physician determined and documented that the patient's condition is appropriate for intensive rehabilitative care in an inpatient rehabilitation facility. Will admit to inpatient rehab today.   Preadmission Screen Completed By:  Clois Dupes, 08/18/2016 11:52 AM ______________________________________________________________________   Discussed status with Dr. Riley Kill on 08/18/2016 at  1345 and received telephone approval for admission today.  Admission Coordinator:  Clois Dupes, time 1610 Date 08/18/2016       Cosigned by: Ranelle Oyster, MD at 08/18/2016 1:54 PM  Revision History

## 2016-08-18 NOTE — Progress Notes (Signed)
I met with pt in dialysis and with her permission, contacted her Mom to discuss an inpt rehab admission .Both are in agreement to admit. I will contact Dr. Carles Collet to make the arrangements to admit today. RN CM is aware. [phone]

## 2016-08-18 NOTE — Discharge Summary (Signed)
Physician Discharge Summary  [name] Small ZOX:[id number] DOB: 01/14/1991 DOA: 08/12/2016  PCP: Jeanann Lewandowsky, MD  Admit date: 08/12/2016 Discharge date: 08/18/2016  Admitted From:  HOME Disposition:  CIR  Recommendations for Outpatient Follow-up:  1. Follow up with PCP in 1-2 weeks 2. Please obtain BMP/CBC in one week  Discharge Condition: stable CODE STATUS: FULL Diet recommendation: Renal diet with 1200 cc fluid restriction   Brief/Interim Summary: 25 y.o.femalewith medical history significant of ESRD on dialysis due to childhood polycystic kidney disease, severe systolic EF 12%, AICD, noncompliance, PE comes in with missing dialysis this week. Pt reports that her transportation was not set up at her discharge last week and that is why she missed her sessions all week. Denies fevers. C/o chest pain and back pain. mainly her back pain. She is still having back pain from her recent compression fractures. Pt doing well on RA. Referred for admission for need for dialysis  Discharge Diagnoses:  pulmonary edema and acute on chronic systolic CHF. -patient is HD dependent for volume control and has missed over a week of treatments -improving with HD treatments since admission-->stable on RA -07/18/16- myoview EF in 12%, patient status post ICD -no further work up anticipated by cardiology at this time -her troponin elevation due to ESRD and demand ischemia -due to low BP not on b-blockers or hydralazine/IMDUR were not started initially -start low dose coreg now with improved BPs  Uremic Pericarditis -chest pain improving with HD -06/19/2016 echo EF 20-25%, PA SP 28, moderate MR -cardiology has signed off  ESRD: -renal service consulted -need aggressive HD therapy  anemia of chronic renal disease  -IV iron and aranesp as per renal discretion -no signs of acute bleeding   secondary hyperparathyroidism -continue sevelamer, cinacalcet and Calcitrol  -dosing  adjusted by nephrology  depression: -will continue Sertraline   chronic back pain: due to compression fractures (T7-T8-T12) -Continue TLSO brace -continue PRN analgesics  hyperkalemia:  -improved with HD  HTN: -stable and well controlled w/o meds -continue HD    Discharge Instructions  Discharge Instructions    Diet - low sodium heart healthy    Complete by:  As directed    Increase activity slowly    Complete by:  As directed        Medication List    STOP taking these medications   famotidine 20 MG tablet Commonly known as:  PEPCID   LORazepam 1 MG tablet Commonly known as:  ATIVAN     TAKE these medications   calcitonin (salmon) 200 UNIT/ACT nasal spray Commonly known as:  MIACALCIN Place 1 spray into alternate nostrils daily.   calcitRIOL 0.5 MCG capsule Commonly known as:  ROCALTROL Take 2 capsules (1 mcg total) by mouth every Monday, Wednesday, and Friday with hemodialysis. What changed:  how much to take   carvedilol 3.125 MG tablet Commonly known as:  COREG Take 1 tablet (3.125 mg total) by mouth 2 (two) times daily with a meal.   cinacalcet 30 MG tablet Commonly known as:  SENSIPAR Take 3 tablets (90 mg total) by mouth daily with supper. What changed:  how much to take  when to take this   colchicine 0.6 MG tablet Take 0.5 tablet (0.3 mg total) by mouth two times per week. What changed:  how much to take  how to take this  when to take this  additional instructions   multivitamin Tabs tablet Take 1 tablet by mouth daily.   naproxen 500 MG tablet Commonly known  as:  NAPROSYN Take 1 tablet (500 mg total) by mouth 2 (two) times daily with a meal.   oxyCODONE 15 MG immediate release tablet Commonly known as:  ROXICODONE Take 1 tablet (15 mg total) by mouth every 6 (six) hours as needed. You must try to cut back on the frequency of these pills as your pain improves   sertraline 100 MG tablet Commonly known as:  ZOLOFT Take 1  tablet (100 mg total) by mouth daily.   sevelamer carbonate 800 MG tablet Commonly known as:  RENVELA Take 3 tablets (2,400 mg total) by mouth 3 (three) times daily with meals. What changed:  how much to take  additional instructions       Allergies  Allergen Reactions  . Aspirin Other (See Comments)    Interacts with Coreg  . Morphine And Related Itching and Other (See Comments)    Ok with oxycodone   . Tramadol Anaphylaxis  . Vicodin [Hydrocodone-Acetaminophen] Hives  . Buprenorphine Hcl Itching and Other (See Comments)    Ok with oxycodone  . Iohexol Itching    Consultations:  Renal  cardiology   Procedures/Studies: Dg Chest 2 View  Result Date: 08/12/2016 CLINICAL DATA:  Chest pain with dyspnea EXAM: CHEST  2 VIEW COMPARISON:  07/27/2016 thoracic spine CT, chest CT from 06/17/2016 FINDINGS: There is cardiomegaly with bibasilar atelectasis and right small pleural effusion. The aorta is not aneurysmal. Right-sided AICD device with right ventricular lead is again seen which appears intact. There is 50% compression of what may represent the T12 vertebral body when using the apex of a pre-existing IVC filter to determine levels based on the filter location on prior CT. The T7 compression fracture is partially obscured by the patient's chair vascular clips project over the visualized distal right arm and elbow. IMPRESSION: Stable cardiomegaly. Bibasilar atelectasis with small right pleural effusion. There appears to be a 50% compression fracture of what may represent T12, new since previous thoracic spine CT from 07/27/2016. Electronically Signed   By: Tollie Eth M.D.   On: 08/12/2016 22:38   Dg Chest 2 View  Result Date: 07/26/2016 CLINICAL DATA:  Shortness of breath and chest pain with history of recent rib fractures EXAM: CHEST  2 VIEW COMPARISON:  07/23/2016 FINDINGS: Cardiac shadow remains enlarged. A defibrillator is again seen. The lungs are well aerated bilaterally.  Previously seen left basilar changes have improved in the interval from the prior exam. No sizable effusion is seen. No acute bony abnormality is noted. IMPRESSION: Improved aeration in the left lung base when compare with the prior exam. No new focal abnormality is seen. Electronically Signed   By: Alcide Clever M.D.   On: 07/26/2016 18:53   Dg Ribs Unilateral W/chest Right  Result Date: 07/23/2016 CLINICAL DATA:  Fall at home with right rib pain. Initial encounter. EXAM: RIGHT RIBS AND CHEST - 3+ VIEW COMPARISON:  07/01/2016 FINDINGS: Single chamber pacer/ICD battery pack obscures some of the right ribs. A marker is present more inferiorly and these levels are negative for acute fracture. No hemothorax or pneumothorax on the right. There is infrahilar opacity on the left with small pleural effusion. Chronic marked cardiopericardial enlargement and pulmonary venous congestion. IVC filter. IMPRESSION: 1. Negative for fracture along the symptomatic lower right ribs. No pneumothorax. 2. Left lower lobe atelectasis or pneumonia with small pleural effusion. 3. Chronic marked cardiomegaly and pulmonary venous congestion. Electronically Signed   By: Marnee Spring M.D.   On: 07/23/2016 21:53  Dg Thoracic Spine 2 View  Result Date: 07/24/2016 CLINICAL DATA:  25 y/o  F; fall at home with severe mid back pain. EXAM: THORACIC SPINE 2 VIEWS COMPARISON:  06/17/2016 CT of the chest. FINDINGS: Single lead AICD. Mild S-shaped curvature of thoracic spine. IVC filter. Mild anterior loss of height of a mid thoracic vertebral body, possibly T8, appears new from prior CT. IMPRESSION: Suspected new mild anterior compression deformity of T8. Confirmation with thoracic CT or MRI is recommended. These results will be called to the ordering clinician or representative by the Radiologist Assistant, and communication documented in the PACS or zVision Dashboard. Electronically Signed   By: Mitzi Hansen M.D.   On:  07/24/2016 00:38   Ct Thoracic Spine Wo Contrast  Result Date: 07/27/2016 CLINICAL DATA:  25 year old female with progressive upper back pain and shortness of breath after falling. EXAM: CT THORACIC SPINE WITHOUT CONTRAST TECHNIQUE: Multidetector CT imaging of the thoracic spine was performed without intravenous contrast administration. Multiplanar CT image reconstructions were also generated. COMPARISON:  Prior chest CT 06/17/2016. Prior CT scan of the abdomen and pelvis 10/26/2015 FINDINGS: Alignment: Slightly exaggerated thoracic kyphosis. No scoliotic curvature. Vertebrae: Acute nondisplaced fracture through the posterior aspect of the right fifth rib. Subtle compression fracture of the anterior aspect of T7 with buckling of the cortex. There is approximately 18- 19% height loss anteriorly. No posterior retropulsion. Findings are new compared to prior chest CT 06/17/2016. Very subtle probable compression deformity of the superior endplate of T8 without height loss. Additionally, there is subtle buckling of the anterior cortex at T12 which is less specific but new compared to October 26, 2015. No associated height loss. Diffuse demineralization of the bones suggests osteoporosis. Paraspinal and other soft tissues: No paraspinal mass or hematoma. Disc levels: Moderately large right pleural effusion. Incompletely imaged right subclavian approach intracardiac defibrillator. Cardiomegaly. Moderate pericardial effusion. Incompletely imaged IVC filter. Numerous bilateral renal cysts and calcifications of varying complexity consistent with the clinical history of polycystic kidney disease. IMPRESSION: 1. Acute to subacute fracture of T7 with 18-19% anterior height loss. No evidence of posterior retropulsion. 2. Suspect subtle compression deformity of the superior endplate of T8 without associated height loss. 3. Similarly, probable acute to subacute fracture involving the anterior cortex of T12 without evidence of  associated height loss. This finding is new compared to October 26, 2015 and has likely occurred at some point since that time. 4. Diffusely abnormal bone and marrow density with areas of relatively increased sclerosis and osteopenia. Findings may represent aggressive osteopenia, or renal osteodystrophy. 5. Moderately large right pleural effusion without significant interval change compared 06/17/2016. 6. Nondisplaced fracture the posterior aspect of the right fifth rib. 7. Cardiomegaly. 8. Moderate pericardial effusion. 9. Numerous renal cysts of varying complexity bilaterally. Electronically Signed   By: Malachy Moan M.D.   On: 07/27/2016 10:22        Discharge Exam: Vitals:   08/18/16 1119 08/18/16 1215  BP: 133/90 (!) 119/92  Pulse: 97   Resp: 18 (!) 22  Temp: 98.1 F (36.7 C)    Vitals:   08/18/16 1030 08/18/16 1100 08/18/16 1119 08/18/16 1215  BP: 121/89 122/70 133/90 (!) 119/92  Pulse: (!) 103 96 97   Resp: (!) 22 18 18  (!) 22  Temp:   98.1 F (36.7 C)   TempSrc:   Oral   SpO2:   96%   Weight:   50.8 kg (111 lb 15.9 oz)   Height:  General: Pt is alert, awake, not in acute distress Cardiovascular: RRR, S1/S2 +, no rubs, no gallops Respiratory: CTA bilaterally, no wheezing, no rhonchi Abdominal: Soft, NT, ND, bowel sounds + Extremities: no edema, no cyanosis   The results of significant diagnostics from this hospitalization (including imaging, microbiology, ancillary and laboratory) are listed below for reference.    Significant Diagnostic Studies: Dg Chest 2 View  Result Date: 08/12/2016 CLINICAL DATA:  Chest pain with dyspnea EXAM: CHEST  2 VIEW COMPARISON:  07/27/2016 thoracic spine CT, chest CT from 06/17/2016 FINDINGS: There is cardiomegaly with bibasilar atelectasis and right small pleural effusion. The aorta is not aneurysmal. Right-sided AICD device with right ventricular lead is again seen which appears intact. There is 50% compression of what may  represent the T12 vertebral body when using the apex of a pre-existing IVC filter to determine levels based on the filter location on prior CT. The T7 compression fracture is partially obscured by the patient's chair vascular clips project over the visualized distal right arm and elbow. IMPRESSION: Stable cardiomegaly. Bibasilar atelectasis with small right pleural effusion. There appears to be a 50% compression fracture of what may represent T12, new since previous thoracic spine CT from 07/27/2016. Electronically Signed   By: Tollie Ethavid  Kwon M.D.   On: 08/12/2016 22:38   Dg Chest 2 View  Result Date: 07/26/2016 CLINICAL DATA:  Shortness of breath and chest pain with history of recent rib fractures EXAM: CHEST  2 VIEW COMPARISON:  07/23/2016 FINDINGS: Cardiac shadow remains enlarged. A defibrillator is again seen. The lungs are well aerated bilaterally. Previously seen left basilar changes have improved in the interval from the prior exam. No sizable effusion is seen. No acute bony abnormality is noted. IMPRESSION: Improved aeration in the left lung base when compare with the prior exam. No new focal abnormality is seen. Electronically Signed   By: Alcide CleverMark  Lukens M.D.   On: 07/26/2016 18:53   Dg Ribs Unilateral W/chest Right  Result Date: 07/23/2016 CLINICAL DATA:  Fall at home with right rib pain. Initial encounter. EXAM: RIGHT RIBS AND CHEST - 3+ VIEW COMPARISON:  07/01/2016 FINDINGS: Single chamber pacer/ICD battery pack obscures some of the right ribs. A marker is present more inferiorly and these levels are negative for acute fracture. No hemothorax or pneumothorax on the right. There is infrahilar opacity on the left with small pleural effusion. Chronic marked cardiopericardial enlargement and pulmonary venous congestion. IVC filter. IMPRESSION: 1. Negative for fracture along the symptomatic lower right ribs. No pneumothorax. 2. Left lower lobe atelectasis or pneumonia with small pleural effusion. 3.  Chronic marked cardiomegaly and pulmonary venous congestion. Electronically Signed   By: Marnee SpringJonathon  Watts M.D.   On: 07/23/2016 21:53   Dg Thoracic Spine 2 View  Result Date: 07/24/2016 CLINICAL DATA:  25 y/o  F; fall at home with severe mid back pain. EXAM: THORACIC SPINE 2 VIEWS COMPARISON:  06/17/2016 CT of the chest. FINDINGS: Single lead AICD. Mild S-shaped curvature of thoracic spine. IVC filter. Mild anterior loss of height of a mid thoracic vertebral body, possibly T8, appears new from prior CT. IMPRESSION: Suspected new mild anterior compression deformity of T8. Confirmation with thoracic CT or MRI is recommended. These results will be called to the ordering clinician or representative by the Radiologist Assistant, and communication documented in the PACS or zVision Dashboard. Electronically Signed   By: Mitzi HansenLance  Furusawa-Stratton M.D.   On: 07/24/2016 00:38   Ct Thoracic Spine Wo Contrast  Result Date: 07/27/2016  CLINICAL DATA:  25 year old female with progressive upper back pain and shortness of breath after falling. EXAM: CT THORACIC SPINE WITHOUT CONTRAST TECHNIQUE: Multidetector CT imaging of the thoracic spine was performed without intravenous contrast administration. Multiplanar CT image reconstructions were also generated. COMPARISON:  Prior chest CT 06/17/2016. Prior CT scan of the abdomen and pelvis 10/26/2015 FINDINGS: Alignment: Slightly exaggerated thoracic kyphosis. No scoliotic curvature. Vertebrae: Acute nondisplaced fracture through the posterior aspect of the right fifth rib. Subtle compression fracture of the anterior aspect of T7 with buckling of the cortex. There is approximately 18- 19% height loss anteriorly. No posterior retropulsion. Findings are new compared to prior chest CT 06/17/2016. Very subtle probable compression deformity of the superior endplate of T8 without height loss. Additionally, there is subtle buckling of the anterior cortex at T12 which is less specific but  new compared to October 26, 2015. No associated height loss. Diffuse demineralization of the bones suggests osteoporosis. Paraspinal and other soft tissues: No paraspinal mass or hematoma. Disc levels: Moderately large right pleural effusion. Incompletely imaged right subclavian approach intracardiac defibrillator. Cardiomegaly. Moderate pericardial effusion. Incompletely imaged IVC filter. Numerous bilateral renal cysts and calcifications of varying complexity consistent with the clinical history of polycystic kidney disease. IMPRESSION: 1. Acute to subacute fracture of T7 with 18-19% anterior height loss. No evidence of posterior retropulsion. 2. Suspect subtle compression deformity of the superior endplate of T8 without associated height loss. 3. Similarly, probable acute to subacute fracture involving the anterior cortex of T12 without evidence of associated height loss. This finding is new compared to October 26, 2015 and has likely occurred at some point since that time. 4. Diffusely abnormal bone and marrow density with areas of relatively increased sclerosis and osteopenia. Findings may represent aggressive osteopenia, or renal osteodystrophy. 5. Moderately large right pleural effusion without significant interval change compared 06/17/2016. 6. Nondisplaced fracture the posterior aspect of the right fifth rib. 7. Cardiomegaly. 8. Moderate pericardial effusion. 9. Numerous renal cysts of varying complexity bilaterally. Electronically Signed   By: Malachy Moan M.D.   On: 07/27/2016 10:22     Microbiology: No results found for this or any previous visit (from the past 240 hour(s)).   Labs: Basic Metabolic Panel:  Recent Labs Lab 08/13/16 0840 08/14/16 1450 08/15/16 0716 08/16/16 0700 08/18/16 0726  NA 138 137 134* 140 141  K 5.4* 6.4* 4.2 4.1 4.0  CL 104 103 97* 101 102  CO2 20* 20* 23 26 25   GLUCOSE 100* 98 76 79 88  BUN 89* 103* 45* 35* 50*  CREATININE 11.08* 12.32* 6.93* 5.07* 5.79*   CALCIUM 9.2 7.7* 8.0* 9.5 9.8  PHOS  --  7.5* 5.1* 5.5* 5.4*   Liver Function Tests:  Recent Labs Lab 08/14/16 1450 08/15/16 0716 08/16/16 0700 08/18/16 0726  AST  --  24  --   --   ALT  --  <5*  --   --   ALKPHOS  --  259*  --   --   BILITOT  --  1.3*  --   --   PROT  --  6.7  --   --   ALBUMIN 2.9* 2.7* 2.7* 2.8*   No results for input(s): LIPASE, AMYLASE in the last 168 hours. No results for input(s): AMMONIA in the last 168 hours. CBC:  Recent Labs Lab 08/13/16 0840 08/14/16 1450 08/15/16 0716 08/16/16 0700 08/18/16 0726  WBC 7.4 9.0 9.1 9.5 9.4  HGB 10.1* 9.5* 9.0* 9.1* 8.8*  HCT 32.1*  29.9* 28.6* 29.3* 28.1*  MCV 87.2 87.4 87.7 87.5 87.8  PLT 256 240 196 241 249   Cardiac Enzymes:  Recent Labs Lab 08/13/16 0220 08/13/16 0840 08/13/16 1354  TROPONINI 0.27* 0.25* 0.22*   BNP: Invalid input(s): POCBNP CBG: No results for input(s): GLUCAP in the last 168 hours.  Time coordinating discharge:  Greater than 30 minutes  Signed:  Romell Wolden, DO [facility] Pager: [phone] 08/18/2016, 12:57 PM

## 2016-08-19 ENCOUNTER — Inpatient Hospital Stay (HOSPITAL_COMMUNITY): Payer: Medicare Other | Admitting: Occupational Therapy

## 2016-08-19 ENCOUNTER — Inpatient Hospital Stay (HOSPITAL_COMMUNITY): Payer: Medicare Other | Admitting: Physical Therapy

## 2016-08-19 ENCOUNTER — Inpatient Hospital Stay (HOSPITAL_COMMUNITY): Payer: Medicare Other | Admitting: *Deleted

## 2016-08-19 NOTE — Progress Notes (Signed)
Physical Therapy Session Note  Patient Details  Name: [name] Small MRN: [id number] Date of Birth: [birth year]  Today's Date: 08/19/2016 PT Individual Time: 1500-1535 PT Individual Time Calculation (min): 35 min    Short Term Goals: Week 1:  PT Short Term Goal 1 (Week 1): Patient will be able to complete supine to sit transfer with supervision PT Short Term Goal 2 (Week 1): Patient will be able to perform bed to w/c transfer with Supervision PT Short Term Goal 3 (Week 1): Patient will be able to  cover the distance of 75 feet with RW with Supervision PT Short Term Goal 4 (Week 1): Patient will be able to perform car transfer with Supervision to regular height car and min A for SUV PT Short Term Goal 5 (Week 1): Patient will be able to perform standing activities for 5 min w/o rest break and w/o LOB, no UE support  Skilled Therapeutic Interventions/Progress Updates:  Presented in bed agreeable for therapy. Propelled w/c 20ft with min cues for turns.  Attempted x 1 step with bilateral rails noted fatigue and increased pain per pt with x1. Did not attempt further steps. Pain decreased with sitting rest. Performed standing static balance with reaching high/low and outside BoS. Pt able to maintain F+ balance with no LOB.  After rest ambulated 1ft with RW and CGA with step through pattern and slow guarded cadence. Pt transported to room in w/c and performed stand pivot transfer to bed. Pt required modA sit to supine with assistance for LE placement. Pt left in room with all needs set.   Therapy Documentation Precautions:  Precautions Precautions: Fall, Back Precaution Booklet Issued: No Precaution Comments: Patient able to state precautions. Required Braces or Orthoses: Spinal Brace Spinal Brace: Thoracolumbosacral orthotic Restrictions Weight Bearing Restrictions: No General:   Vital Signs: Therapy Vitals Temp: 99 F (37.2 C) Temp Source: Oral Pulse Rate: (!) 51 Resp: 18 BP:  114/81 Patient Position (if appropriate): Sitting Oxygen Therapy SpO2: 100 % O2 Device: Not Delivered Pain:   Mobility: Bed Mobility Bed Mobility: Right Sidelying to Sit;Left Sidelying to Sit Right Sidelying to Sit: 3: Mod assist Left Sidelying to Sit: 3: Mod assist Transfers Transfers: Yes Sit to Stand: 3: Mod assist;With armrests;With upper extremity assist;From chair/3-in-1 Stand to Sit: 3: Mod assist;With upper extremity assist;With armrests;To chair/3-in-1 Locomotion : Ambulation Ambulation: Yes Ambulation/Gait Assistance: 4: Min assist Ambulation Distance (Feet): 35 Feet Assistive device: Rolling walker Gait Gait: Yes Gait Pattern: Step-to pattern;Antalgic Gait velocity: decreased Stairs / Additional Locomotion Stairs: No Corporate treasurer: Yes Wheelchair Assistance: 4: Systems analyst: Both upper extremities Wheelchair Parts Management: Needs assistance Distance: 50  Trunk/Postural Assessment : Cervical Assessment Cervical Assessment: Within Functional Limits Thoracic Assessment Thoracic Assessment: Exceptions to [facility] (spinal brace secondary to thoracic fractures) Lumbar Assessment Lumbar Assessment: Exceptions to [facility] (posterior pelvic tilt) Postural Control Postural Control: Deficits on evaluation (Pt with increased difficulty standing upright. )  Balance: Balance Balance Assessed: Yes Static Sitting Balance Static Sitting - Balance Support: Right upper extremity supported;Left upper extremity supported Static Sitting - Level of Assistance: 5: Stand by assistance Dynamic Sitting Balance Dynamic Sitting - Balance Support: Feet supported Dynamic Sitting - Level of Assistance: 5: Stand by assistance Sitting balance - Comments: Able to adjust brace sitting EOB without difficulty.  Static Standing Balance Static Standing - Balance Support: Right upper extremity supported;Left upper extremity supported Static Standing -  Level of Assistance: 3: Mod assist Dynamic Standing Balance Dynamic Standing - Balance Support:  Bilateral upper extremity supported Dynamic Standing - Level of Assistance: 2: Max assist Exercises:   Other Treatments:     See Function Navigator for Current Functional Status.   Therapy/Group: Individual Therapy  Kamauri Kathol  Jahliyah Trice, PTA  08/19/2016, 3:48 PM

## 2016-08-19 NOTE — Evaluation (Signed)
Occupational Therapy Assessment and Plan  Patient Details  Name: Gwenda Heiner MRN: [id number] Date of Birth: [birth year]  OT Diagnosis: abnormal posture, acute pain, muscle weakness (generalized) and pain in thoracic spine Rehab Potential: Rehab Potential (ACUTE ONLY): Good ELOS: 10-1`2 days   Today's Date: 08/19/2016 OT Individual Time: 1100-1159 OT Individual Time Calculation (min): 59 min      Problem List: Patient Active Problem List   Diagnosis Date Noted  . 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 Acute on chronic systolic CHF (congestive heart failure) ([place]) 08/16/2016  . Noncompliance of patient with renal dialysis ([place]) 08/12/2016  . Hyperparathyroidism due to end stage renal disease on dialysis ([place]) 08/01/2016  . Osteoporosis 08/01/2016  . Thoracic compression fracture - T7, T8, T12 07/27/2016  . Acute pulmonary edema ([place]) 07/26/2016  . Essential hypertension 06/17/2016  . Depression with anxiety 06/17/2016  . Volume overload 04/14/2016  . Peripheral edema 04/14/2016  . NICM (nonischemic cardiomyopathy) ([place]) 04/14/2016  . Internal jugular (IJ) vein thromboembolism, acute ([place]) 04/14/2016  . Pericardial effusion 04/14/2016  . Chronic anticoagulation   . Hypertensive urgency 04/11/2016  . DVT (deep venous thrombosis), left 04/11/2016  . Cellulitis of left upper extremity 03/31/2016  . Chronic pain syndrome 02/29/2016  . Chronic combined systolic and diastolic congestive heart failure ([place]) 01/04/2016  . AICD (automatic cardioverter/defibrillator) present 01/04/2016  . 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 Pathologic pelvic fracture 01/04/2016  . Depression 01/04/2016  . Pain in the chest   . ESRD on dialysis ([place])   . Tachycardia   . Elevated serum hCG   . Chest pain 10/11/2015  . Fluid overload 10/10/2015  . Hypertension 10/10/2015  . Anemia of chronic renal failure, stage 5 (HCC) 10/04/2015  . Thrombocytopenia ([place]) 10/04/2015  . Chest pain of uncertain  etiology 54/49/2010  . Chronically Elevated troponin 03/03/2015  . Hyperkalemia 02/21/2015  . Acute on chronic systolic heart failure ([place]) 02/21/2012  . Pulmonary infarct ([place]) 02/07/2012  . ESRD (end stage renal disease) on dialysis ([place]) 02/03/2012  . PE (pulmonary embolism) 02/03/2012    Past Medical History:  Past Medical History:  Diagnosis Date  . AICD (automatic cardioverter/defibrillator) present 12/16/14   [employer]  . Anemia   . Anxiety   . Cardiomyopathy   . Cellulitis and abscess of face 03/22/2013  . CHF (congestive heart failure) ([place])   . Chronic anticoagulation   . Chronic pain   . Depression   . DVT (deep venous thrombosis) ([place]) ~ 2014   BLE  . Dysrhythmia    at times per pt.  . End stage renal disease ([place])    s/p cadaveric renal transplant 07/2007 and transplant failure 08/2011, then transplant nephrectomy 08/2011  . ESRD (end stage renal disease) on dialysis [facility])    "MWF; [place]" (08/15/2016)  . Fracture, thoracic vertebra ([place]) 07/2016   "T7-T12"  . GERD (gastroesophageal reflux disease)   . H/O transfusion of packed red blood cells   . Heart murmur   . Hemodialysis patient ([place])    Mon. Wed. Fri  . Hyperkalemia 09/2015  . Hypertension   . Narcotic abuse, continuous   . Osteoporosis   . Pelvic fracture ([place]) 06/2015   "on the right"  . Polycystic kidney disease   . Pulmonary emboli (HCC) 01/2012   Bilateral, moderate clot burden, areas of pulmonary infarction and central necrosis  . Renal insufficiency   . Sickle cell anemia (HCC)    Past Surgical History:  Past Surgical History:  Procedure Laterality Date  . AV FISTULA PLACEMENT Bilateral    "right side stopped working & never had it revised" (04/11/2016)  . AV FISTULA PLACEMENT Right 08/01/2016   Procedure: INSERTION OF ARTERIOVENOUS (AV) GORE-TEX GRAFT RIGHT ARM;  Surgeon: Angelia Mould, MD;  Location: [place];  Service: Vascular;  Laterality: Right;  .  [place] REMOVAL Right 08/03/2016   Procedure: REMOVAL OF ARTERIOVENOUS GORETEX GRAFT ([place]);  Surgeon: Waynetta Sandy, MD;  Location: [place];  Service: Vascular;  Laterality: Right;  . CARDIAC CATHETERIZATION    . IMPLANTABLE CARDIOVERTER DEFIBRILLATOR IMPLANT Right 12/2014  . INCISION AND DRAINAGE ABSCESS Right 03/21/2013   Procedure: INCISION AND DRAINAGE RIGHT CHEEK ABSCESS REMOVAL OF FOREIGN BODY;  Surgeon: Izora Gala, MD;  Location: [place];  Service: ENT;  Laterality: Right;  . INSERTION OF DIALYSIS CATHETER Right Mar 09, 2016   thigh, Dr. Lucky Cowboy, [facility]  . 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 NEPHRECTOMY Right     kidney placed in 2008, and body rejected in 2012   . PERIPHERAL VASCULAR CATHETERIZATION Left 03/09/2016   Procedure: A/V Shuntogram/Fistulagram;  Surgeon: Algernon Huxley, MD;  Location: [place] CV LAB;  Service: Cardiovascular;  Laterality: Left;  . PERIPHERAL VASCULAR CATHETERIZATION N/A 05/09/2016   Procedure: Dialysis/Perma Catheter Removal;  Surgeon: Katha Cabal, MD;  Location: [place] CV LAB;  Service: Cardiovascular;  Laterality: N/A;  . PERIPHERAL VASCULAR CATHETERIZATION N/A 07/12/2016   Procedure: Dialysis/Perma Catheter Insertion;  Surgeon: Algernon Huxley, MD;  Location: [place] CV LAB;  Service: Cardiovascular;  Laterality: N/A;  . PERIPHERAL VASCULAR CATHETERIZATION Right 07/31/2016   Procedure: Upper Extremity Venography;  Surgeon: Waynetta Sandy, MD;  Location: [place] CV LAB;  Service: Cardiovascular;  Laterality: Right;  . REVISON OF ARTERIOVENOUS FISTULA Left 03/22/2016   Procedure: REVISON OF ARTERIOVENOUS FISTULA ( ARTEGRAFT );  Surgeon: Algernon Huxley, MD;  Location: [facility];  Service: Vascular;  Laterality: Left;  . TONSILLECTOMY AND ADENOIDECTOMY  ~ 2000    Assessment & Plan Clinical Impression: Patient is a 25 y.o. year old female with recent admission to the hospital on 08/13/2016 after missing several recent dialysis  treatments due to reported transportation issues. Complaints of increased back pain. Chest x-ray showed basilar atelectasis with small right pleural effusion. Appeared to be a 50% compression fracture of what may represent T12 new since previous thoracic spine CT from 07/27/2016. Findings of fluid overload with follow-up cardiology services received emergent dialysis. Elevated troponin 0.2 felt to be demand ischemia. Follow-up renal services for ongoing dialysis .  Patient transferred to CIR on 08/18/2016 .    Patient currently requires mod with basic self-care skills secondary to muscle weakness and decreased sitting balance, decreased standing balance, decreased postural control and decreased balance strategies.  Prior to hospitalization, patient could complete ADLs with modified independent .  Patient will benefit from skilled intervention to decrease level of assist with basic self-care skills and increase independence with basic self-care skills prior to discharge home with care partner.  Anticipate patient will require intermittent supervision and follow up home health.  OT - End of Session Activity Tolerance: Improving Endurance Deficit: Yes OT Assessment Rehab Potential (ACUTE ONLY): Good Barriers to Discharge: Decreased caregiver support Barriers to Discharge Comments: boyfriend works days OT Patient demonstrates impairments in the following area(s): Balance;Pain;Endurance OT Basic ADL's Functional Problem(s): Grooming;Bathing;Dressing;Toileting OT Advanced ADL's Functional Problem(s): Simple Meal Preparation;Light Housekeeping OT Transfers Functional Problem(s): Tub/Shower;Toilet OT Additional Impairment(s): Fuctional Use  of Upper Extremity OT Plan OT Intensity: Minimum of 1-2 x/day, 45 to 90 minutes OT Frequency: 5 out of 7 days OT Duration/Estimated Length of Stay: 10-1`2 days OT Treatment/Interventions: Balance/vestibular training;Functional mobility training;Patient/family  education;Neuromuscular re-education;DME/adaptive equipment instruction;Self Care/advanced ADL retraining;Therapeutic Activities;UE/LE Coordination activities;UE/LE Strength taining/ROM;Therapeutic Exercise;Discharge planning OT Self Feeding Anticipated Outcome(s): independent OT Basic Self-Care Anticipated Outcome(s): supervision OT Toileting Anticipated Outcome(s): modified independent level OT Bathroom Transfers Anticipated Outcome(s): supervision OT Recommendation Patient destination: Home Follow Up Recommendations: Home health OT;24 hour supervision/assistance Equipment Recommended: 3 in 1 bedside comode;Tub/shower seat   Skilled Therapeutic Intervention Began education on selfcare re-training.  Did not complete full ADL secondary to female therapist completing this eval and did not want pt to be uncomfortable.  She was able to donn and doff her TLSO in sitting with just setup assistance.  Mod assist for simple stand pivot transfer from bed to wheelchair during session.  Did not attempt ambulation.  Decreased ability to reach her RLE when attempting to cross them in the wheelchair but could reach it slightly better in long sitting on the bed.  May benefit from AE for LB dressing.  Discussed need for supervision at discharge for safety but hopes of reaching modified independent level for toileting tasks using wheelchair or walker depending on safety.  Pt left in wheelchair with call button and phone in reach.    OT Evaluation Precautions/Restrictions  Precautions Precautions: Fall;Back Precaution Booklet Issued: No Precaution Comments: Patient able to state precautions. Required Braces or Orthoses: Spinal Brace Spinal Brace: Thoracolumbosacral orthotic Restrictions Weight Bearing Restrictions: No  Pain Pain Assessment Pain Assessment: 0-10 Pain Score: 8  Pain Type: Chronic pain;Acute pain Pain Location: Back Pain Orientation: Mid Pain Descriptors / Indicators: Aching Pain Onset:  On-going Patients Stated Pain Goal: 0 Pain Intervention(s): Medication (See eMAR) Multiple Pain Sites: No Home Living/Prior Functioning Home Living Available Help at Discharge: Available PRN/intermittently, Friend(s), Family Type of Home: Apartment Home Access: Level entry Home Layout: One level Bathroom Shower/Tub: Tub/shower unit, Architectural technologist: Standard Bathroom Accessibility: Yes Additional Comments: Pt sponge bathing since she has a femoreal dialysis catheter  Lives With: Significant other (Boyfriend stays at times ) IADL History Homemaking Responsibilities: Yes Meal Prep Responsibility: Primary Laundry Responsibility: Primary Prior Function Level of Independence: Requires assistive device for independence  Able to Take Stairs?: No Driving: No Vocation: On disability Comments: Pt uses [place] transport to op dialysis ADL  See Function Section of chart for details  Vision/Perception  Vision- History Baseline Vision/History: No visual deficits Patient Visual Report: No change from baseline Vision- Assessment Vision Assessment?: No apparent visual deficits  Cognition Overall Cognitive Status: Within Functional Limits for tasks assessed Arousal/Alertness: Awake/alert Orientation Level: Person;Place;Situation Person: Oriented Place: Oriented Situation: Oriented Year: 2017 Month: October Day of Week: Correct Memory: Appears intact Immediate Memory Recall: Sock;Blue;Bed Memory Recall: Sock;Blue;Bed Memory Recall Sock: Without Cue Memory Recall Blue: Without Cue Memory Recall Bed: Without Cue Attention: Focused;Sustained;Selective Focused Attention: Appears intact Sustained Attention: Appears intact Selective Attention: Appears intact Awareness: Appears intact Problem Solving: Appears intact Safety/Judgment: Appears intact Sensation Sensation Light Touch: Impaired Detail Light Touch Impaired Details: Impaired RUE Stereognosis: Appears  Intact Hot/Cold: Appears Intact Proprioception: Appears Intact Additional Comments: Pt with decreased acuity of light touch in the right hand compared to the left but still able to detect.  Coordination Gross Motor Movements are Fluid and Coordinated: No Fine Motor Movements are Fluid and Coordinated: Yes Coordination and Movement Description: Pt with limitations in right shoulder  flexion but uses it functionally with selfcare tasks without difficulty.  Motor  Motor Motor: Within Functional Limits Mobility  Bed Mobility Bed Mobility: Right Sidelying to Sit;Left Sidelying to Sit Right Sidelying to Sit: 3: Mod assist Left Sidelying to Sit: 3: Mod assist Transfers Transfers: Sit to Stand;Stand to Sit Sit to Stand: 3: Mod assist;With armrests;With upper extremity assist;From chair/3-in-1 Stand to Sit: 3: Mod assist;With upper extremity assist;With armrests;To chair/3-in-1  Trunk/Postural Assessment  Cervical Assessment Cervical Assessment: Within Functional Limits Thoracic Assessment Thoracic Assessment: Exceptions to [facility] (spinal brace secondary to thoracic fractures) Lumbar Assessment Lumbar Assessment: Exceptions to [facility] (posterior pelvic tilt) Postural Control Postural Control: Deficits on evaluation (Pt with increased difficulty standing upright. )  Balance Balance Balance Assessed: Yes Static Sitting Balance Static Sitting - Balance Support: Right upper extremity supported;Left upper extremity supported Static Sitting - Level of Assistance: 5: Stand by assistance Dynamic Sitting Balance Dynamic Sitting - Balance Support: Feet supported Dynamic Sitting - Level of Assistance: 5: Stand by assistance Sitting balance - Comments: Able to adjust brace sitting EOB without difficulty.  Static Standing Balance Static Standing - Balance Support: Right upper extremity supported;Left upper extremity supported Static Standing - Level of Assistance: 3: Mod assist Dynamic Standing  Balance Dynamic Standing - Balance Support: Bilateral upper extremity supported Dynamic Standing - Level of Assistance: 2: Max assist Extremity/Trunk Assessment RUE Assessment RUE Assessment: Exceptions to WFL (AROM 0- 80 degrees for shoulder flexion.  All other joints AROM WFLs and strength at least 3+/5.  Not formally tested secondary to precautions.  ) LUE Assessment LUE Assessment: Within Functional Limits   See Function Navigator for Current Functional Status.   Refer to Care Plan for Long Term Goals  Recommendations for other services: None  Discharge Criteria: Patient will be discharged from OT if patient refuses treatment 3 consecutive times without medical reason, if treatment goals not met, if there is a change in medical status, if patient makes no progress towards goals or if patient is discharged from hospital.  The above assessment, treatment plan, treatment alternatives and goals were discussed and mutually agreed upon: by patient  Calinda Stockinger OTR/L 08/19/2016, 12:52 PM

## 2016-08-19 NOTE — Progress Notes (Signed)
Subjective: Interval History: has no complaint, ok with getting leg AVG.  Objective: Vital signs in last 24 hours: Temp:  [98 F (36.7 C)-99.5 F (37.5 C)] 98 F (36.7 C) (10/28 1540) Pulse Rate:  [92-108] 95 (10/28 0608) Resp:  [18-25] 18 (10/28 0867) BP: (106-133)/(70-95) 125/82 (10/28 0608) SpO2:  [96 %-100 %] 100 % (10/28 6195) Weight:  [50.8 kg (111 lb 15.9 oz)-54.9 kg (121 lb)] 54.5 kg (120 lb 2.4 oz) (10/28 0932) Weight change:   Intake/Output from previous day: 10/27 0701 - 10/28 0700 In: 100 [P.O.:100] Out: -  Intake/Output this shift: No intake/output data recorded.  General appearance: alert, cooperative and no distress Resp: diminished breath sounds bilaterally Chest wall: brace Cardio: S1, S2 normal, systolic murmur: holosystolic 2/6, blowing at apex and friction rub heard at apex, much softer GI: liver down 5 cm, pos bs, soft Extremities: groin cath  Lab Results:  Recent Labs  08/18/16 0726  WBC 9.4  HGB 8.8*  HCT 28.1*  PLT 249   BMET:  Recent Labs  08/18/16 0726  NA 141  K 4.0  CL 102  CO2 25  GLUCOSE 88  BUN 50*  CREATININE 5.79*  CALCIUM 9.8   No results for input(s): PTH in the last 72 hours. Iron Studies:  Recent Labs  08/18/16 0829  IRON 17*  TIBC 158*    Studies/Results: No results found.  I have reviewed the patient's current medications.  Assessment/Plan: 1 ESRD NONADHERENCE primary issue.  Vol and uremica better.  Needs to get access 2 NONADHERENCE 3 CM related to #2 4 anemia on esa 5 HPTH vit D, cinn 6 Vert comp fx for rehab 7 Pericarditis resolving P HD on Mon, esa, new aVG next week.  Rehab     LOS: 1 day   Luster Hechler L 08/19/2016,8:45 AM

## 2016-08-19 NOTE — Progress Notes (Signed)
[place] PHYSICAL MEDICINE & REHABILITATION     PROGRESS NOTE    Subjective/Complaints: Pain better with dilaudid but it makes her sleepy. Asked about a pain patch.   ROS: Pt denies fever, rash/itching, headache, blurred or double vision, nausea, vomiting, abdominal pain, diarrhea, chest pain, shortness of breath, palpitations, dysuria, dizziness,  bleeding, anxiety, or depression   Objective: Vital Signs: Blood pressure 125/82, pulse 95, temperature 98 F (36.7 C), temperature source Oral, resp. rate 18, height 5\' 3"  (1.6 m), weight 54.5 kg (120 lb 2.4 oz), SpO2 100 %. No results found.  Recent Labs  08/18/16 0726  WBC 9.4  HGB 8.8*  HCT 28.1*  PLT 249    Recent Labs  08/18/16 0726  NA 141  K 4.0  CL 102  GLUCOSE 88  BUN 50*  CREATININE 5.79*  CALCIUM 9.8   CBG (last 3)  No results for input(s): GLUCAP in the last 72 hours.  Wt Readings from Last 3 Encounters:  08/19/16 54.5 kg (120 lb 2.4 oz)  08/18/16 50.8 kg (111 lb 15.9 oz)  08/04/16 51.2 kg (112 lb 14 oz)    Physical Exam:  Constitutional: She is oriented to person, place, and time. She appears well-developed.  Small build  HENT:  Head: Normocephalic and atraumatic.  Right Ear: External ear normal.  Left Ear: External ear normal.  Eyes: Conjunctivae are normal. Pupils are equal, round, and reactive to light.  Neck: No tracheal deviation present. No thyromegaly present.  Cardiovascular: Normal rate, regular rhythm and intact distal pulses.  Exam reveals no gallop and no friction rub.   No murmur  Respiratory: No respiratory distress. She has no wheezes. She has no rales. She exhibits no tenderness  GI: She exhibits no distension. There is no tenderness. There is no rebound.  Musculoskeletal: She exhibits trace LE edema.  Wearing TLSO which is still fitting appropriately  Neurological: She is oriented to person, place, and time. No cranial nerve deficit. Coordination normal.  Strength 4/4 UE prox  to distal. 4-HF with pain inhibition. KE and ADF/PF 4/5. No sensory abnormalities seen.   Skin: No rash noted. No erythema.    Assessment/Plan: 1. Functional and mobility deficits  secondary to thoracic compression fractures which require 3+ hours per day of interdisciplinary therapy in a comprehensive inpatient rehab setting. Physiatrist is providing close team supervision and 24 hour management of active medical problems listed below. Physiatrist and rehab team continue to assess barriers to discharge/monitor patient progress toward functional and medical goals.  Function:  Bathing Bathing position      Bathing parts      Bathing assist        Upper Body Dressing/Undressing Upper body dressing                    Upper body assist        Lower Body Dressing/Undressing Lower body dressing                                  Lower body assist        Toileting Toileting          Toileting assist     Transfers Chair/bed Optician, dispensing          Cognition Comprehension  Expression Expression assist level: Expresses complex ideas: With no assist  Social Interaction Social Interaction assist level: Interacts appropriately with others - No medications needed.  Problem Solving    Memory     Medical Problem List and Plan: 1.  Weakness and functional deficits due to thoracic compression fractures, T7-T8 secondary to recent fall. Back brace when out of bed             -begin therapies today 2.  DVT Prophylaxis/Anticoagulation: SCD.Check vascular study 3. Pain Management with a history of narcotic abuse: Naprosyn 500 mg BID, Oxycodone and Robaxin as needed  -added dilaudid for more severe pain---expressed to the patient that we will me moving away from this as we go forward  -pt asked about a "pain patch"---this may be an option depending on pain levels 4. Mood: Zoloft 100 mg daily 5.  Neuropsych: This patient is capable of making decisions on her own behalf. 6. Skin/Wound Care: Routine skin checks 7. Fluids/Electrolytes/Nutrition: Routine I&O with follow-up chemistries on admit 8.ESRD due to childhood polycystic kidney disease with history of noncompliance to dialysis. continue per [facility] 9.NICM/AICD.EF 12 % 10. Acute on chronic anemia. Follow-up CBC 11. Medical noncompliance. Counseling LOS (Days) 1 A FACE TO FACE EVALUATION WAS PERFORMED  Caira Poche T 08/19/2016 8:14 AM

## 2016-08-19 NOTE — Evaluation (Signed)
Physical Therapy Assessment and Plan  Patient Details  Name: [name] Small MRN: [id number] Date of Birth: [birth year]  PT Diagnosis: Abnormality of gait, Difficulty walking and Muscle weakness Rehab Potential: Good ELOS: 12-14days   Today's Date: 08/19/2016     Problem List: Patient Active Problem List   Diagnosis Date Noted  . 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 Acute on chronic systolic CHF (congestive heart failure) ([place]) 08/16/2016  . Noncompliance of patient with renal dialysis ([place]) 08/12/2016  . Hyperparathyroidism due to end stage renal disease on dialysis ([place]) 08/01/2016  . Osteoporosis 08/01/2016  . Thoracic compression fracture - T7, T8, T12 07/27/2016  . Acute pulmonary edema ([place]) 07/26/2016  . Essential hypertension 06/17/2016  . Depression with anxiety 06/17/2016  . Volume overload 04/14/2016  . Peripheral edema 04/14/2016  . NICM (nonischemic cardiomyopathy) ([place]) 04/14/2016  . Internal jugular (IJ) vein thromboembolism, acute ([place]) 04/14/2016  . Pericardial effusion 04/14/2016  . Chronic anticoagulation   . Hypertensive urgency 04/11/2016  . DVT (deep venous thrombosis), left 04/11/2016  . Cellulitis of left upper extremity 03/31/2016  . Chronic pain syndrome 02/29/2016  . Chronic combined systolic and diastolic congestive heart failure ([place]) 01/04/2016  . AICD (automatic cardioverter/defibrillator) present 01/04/2016  . 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 Pathologic pelvic fracture 01/04/2016  . Depression 01/04/2016  . Pain in the chest   . ESRD on dialysis ([place])   . Tachycardia   . Elevated serum hCG   . Chest pain 10/11/2015  . Fluid overload 10/10/2015  . Hypertension 10/10/2015  . Anemia of chronic renal failure, stage 5 (HCC) 10/04/2015  . Thrombocytopenia ([place]) 10/04/2015  . Chest pain of uncertain etiology 40/98/1191  . Chronically Elevated troponin 03/03/2015  . Hyperkalemia 02/21/2015  . Acute on chronic systolic heart failure ([place])  02/21/2012  . Pulmonary infarct ([place]) 02/07/2012  . ESRD (end stage renal disease) on dialysis ([place]) 02/03/2012  . PE (pulmonary embolism) 02/03/2012    Past Medical History:  Past Medical History:  Diagnosis Date  . AICD (automatic cardioverter/defibrillator) present 12/16/14   [employer]  . Anemia   . Anxiety   . Cardiomyopathy   . Cellulitis and abscess of face 03/22/2013  . CHF (congestive heart failure) ([place])   . Chronic anticoagulation   . Chronic pain   . Depression   . DVT (deep venous thrombosis) ([place]) ~ 2014   BLE  . Dysrhythmia    at times per pt.  . End stage renal disease ([place])    s/p cadaveric renal transplant 07/2007 and transplant failure 08/2011, then transplant nephrectomy 08/2011  . ESRD (end stage renal disease) on dialysis [facility])    "MWF; [place]" (08/15/2016)  . Fracture, thoracic vertebra ([place]) 07/2016   "T7-T12"  . GERD (gastroesophageal reflux disease)   . H/O transfusion of packed red blood cells   . Heart murmur   . Hemodialysis patient ([place])    Mon. Wed. Fri  . Hyperkalemia 09/2015  . Hypertension   . Narcotic abuse, continuous   . Osteoporosis   . Pelvic fracture ([place]) 06/2015   "on the right"  . Polycystic kidney disease   . Pulmonary emboli (HCC) 01/2012   Bilateral, moderate clot burden, areas of pulmonary infarction and central necrosis  . Renal insufficiency   . Sickle cell anemia (HCC)    Past Surgical History:  Past Surgical History:  Procedure Laterality Date  . AV FISTULA PLACEMENT Bilateral    "right side stopped working & never had it revised" (  04/11/2016)  . AV FISTULA PLACEMENT Right 08/01/2016   Procedure: INSERTION OF ARTERIOVENOUS (AV) GORE-TEX GRAFT RIGHT ARM;  Surgeon: Angelia Mould, MD;  Location: [place];  Service: Vascular;  Laterality: Right;  . [place] REMOVAL Right 08/03/2016   Procedure: REMOVAL OF ARTERIOVENOUS GORETEX GRAFT ([place]);  Surgeon: Waynetta Sandy, MD;  Location: [place];  Service: Vascular;  Laterality: Right;  . CARDIAC CATHETERIZATION    . IMPLANTABLE CARDIOVERTER DEFIBRILLATOR IMPLANT Right 12/2014  . INCISION AND DRAINAGE ABSCESS Right 03/21/2013   Procedure: INCISION AND DRAINAGE RIGHT CHEEK ABSCESS REMOVAL OF FOREIGN BODY;  Surgeon: Izora Gala, MD;  Location: [place];  Service: ENT;  Laterality: Right;  . INSERTION OF DIALYSIS CATHETER Right Mar 09, 2016   thigh, Dr. Lucky Cowboy, [facility]  . 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 NEPHRECTOMY Right     kidney placed in 2008, and body rejected in 2012   . PERIPHERAL VASCULAR CATHETERIZATION Left 03/09/2016   Procedure: A/V Shuntogram/Fistulagram;  Surgeon: Algernon Huxley, MD;  Location: [place] CV [facility];  Service: Cardiovascular;  Laterality: Left;  . PERIPHERAL VASCULAR CATHETERIZATION N/A 05/09/2016   Procedure: Dialysis/Perma Catheter Removal;  Surgeon: Katha Cabal, MD;  Location: [place] [facility];  Service: Cardiovascular;  Laterality: N/A;  . PERIPHERAL VASCULAR CATHETERIZATION N/A 07/12/2016   Procedure: Dialysis/Perma Catheter Insertion;  Surgeon: Algernon Huxley, MD;  Location: [place] [facility];  Service: Cardiovascular;  Laterality: N/A;  . PERIPHERAL VASCULAR CATHETERIZATION Right 07/31/2016   Procedure: Upper Extremity Venography;  Surgeon: Waynetta Sandy, MD;  Location: [place] [facility];  Service: Cardiovascular;  Laterality: Right;  . REVISON OF ARTERIOVENOUS FISTULA Left 03/22/2016   Procedure: REVISON OF ARTERIOVENOUS FISTULA ( ARTEGRAFT );  Surgeon: Algernon Huxley, MD;  Location: [facility];  Service: Vascular;  Laterality: Left;  . TONSILLECTOMY AND ADENOIDECTOMY  ~ 2000    Assessment & Plan Clinical Impression: [name] Small a 25 y.o.right handed femalewith history significant for end-stage renal disease with hemodialysis due to childhood polycystic kidney disease, severe systolic ejection fraction of 12%, AICD,medicalnoncomplianceand narcotic abuse, pulmonary emboli.  Boyfriend and parents work during the day.Patient with reported recent fall admitted 07/26/2016-08/04/2016 sustaining T7 fracture 18-19 percent, subtle compression deformity superior endplate of T8, probable acute subacute fracture T12. Patient receive follow-up by Dr. Rolena Infante of orthopedic services. Placed in a TLSO back brace. Discharge to home ambulating with assistive device and home health therapies recommended. Presented 08/13/2016 after missing several recent dialysis treatments due to reported transportation issues. Complaints of increased back pain. Chest x-ray showed basilar atelectasis with small right pleural effusion. Appeared to be a 50% compression fracture of what may represent T12 new since previous thoracic spine CT from 07/27/2016. Findings of fluid overload with follow-up cardiology services received emergent dialysis. Elevated troponin 0.2 felt to be demand ischemia. Follow-up renal services for ongoing dialysis.Patient transferred to CIR on 08/18/2016 .   Patient currently requires min with mobility secondary to muscle weakness.  Prior to hospitalization, patient was modified independent  with mobility and lived with Significant other (Boyfriend stays at times ) in a [place] home.  Home access is  Level entry.  Patient will benefit from skilled PT intervention to maximize safe functional mobility for planned discharge home with intermittent assist.  Anticipate patient will benefit from follow up [facility] at discharge.  PT - End of Session Activity Tolerance: Tolerates 30+ min activity with multiple rests Endurance Deficit: Yes PT Assessment Rehab Potential (ACUTE/IP  ONLY): Good Barriers to Discharge: Decreased caregiver support PT Patient demonstrates impairments in the following area(s): Balance;Endurance;Motor;Pain;Safety PT Transfers Functional Problem(s): Bed to Chair;Car PT Locomotion Functional Problem(s): Ambulation;Wheelchair Mobility;Stairs PT Plan PT Intensity: Minimum  of 1-2 x/day ,45 to 90 minutes PT Frequency: Total of 15 hours over 7 days of combined therapies PT Duration Estimated Length of Stay: 12-14days PT Treatment/Interventions: Ambulation/gait training;Neuromuscular re-education;Stair training;Therapeutic Activities;Therapeutic Exercise;Patient/family education;Functional mobility training;UE/LE Strength taining/ROM;UE/LE Coordination activities PT Transfers Anticipated Outcome(s): Mod I  PT Locomotion Anticipated Outcome(s): Mod I  PT Recommendation Recommendations for Other Services: Neuropsych consult Follow Up Recommendations: Home health PT Patient destination: Home Equipment Recommended: To be determined  Skilled Therapeutic Intervention Session I 1045-1200 75 min  Patient in room, resting in bed with TLSO on, agrees to therapy, states her pain is 8/10 in upper back ,but can not get any medicine  yet.  Session focused on training in bed mobility , min to mod A. Multiple transfers bed to w/c to mat all with min A and cues for sequencing.  Initiated gait training with RW 3 x 35 feet with  Decreased velocity, high dependence on UE for WB, unsteady short steps with step to pattern. Min to mod A due to LOB. Therapeutic exercises with theraband resistance in sitting ( LAQ, Hamstring curls, hip abd and add. ) In supine AAROM for SLR.  Soft tissue mobilization techniques for B hamstrings in order to facilitate extension and reduce pain associated with muscle shortening  due to disuse.  Car transfer with mod A to sedan level , unable to get in and out at SUV height.  W/C mobility and part management training initiated.  At the end of session patient left in room , sitting in w/c with parents present.  Session II 1300-1330 30 min Session focused on strength training and activity tolerance. NuStep x 10 min with one rest break with resistance level set on 3,for strength and reciprocal movement facilitation.  Gait Training with RW with min to mod A 2x40  feet and 1 x 10 feet . Patient very fatigued,needs increased rest break.  At the end of session patient returned to room and left with parents present.   PT Evaluation Precautions/Restrictions Precautions Precautions: Fall;Back Precaution Booklet Issued: No Precaution Comments: Patient able to state precautions. Required Braces or Orthoses: Spinal Brace Spinal Brace: Thoracolumbosacral orthotic Restrictions Weight Bearing Restrictions: No General Chart Reviewed: Yes Family/Caregiver Present: Yes Vital Signs  Pain Pain Assessment Pain Assessment: 0-10 Pain Score: 8  Pain Type: Chronic pain;Acute pain Pain Location: Back Pain Orientation: Mid Pain Descriptors / Indicators: Aching Pain Onset: On-going Patients Stated Pain Goal: 0 Pain Intervention(s): Medication (See eMAR) Multiple Pain Sites: No Home Living/Prior Functioning Home Living Available Help at Discharge: Available PRN/intermittently;Friend(s);Family Type of Home: Apartment Home Access: Level entry Home Layout: One level Bathroom Shower/Tub: Product/process development scientist: Standard Bathroom Accessibility: Yes Additional Comments: Pt sponge bathing since she has a femoreal dialysis catheter  Lives With: Significant other (Boyfriend stays at times ) Prior Function Level of Independence: Requires assistive device for independence  Able to Take Stairs?: No Driving: No Vocation: On disability Comments: Pt uses [place] transport to op dialysis  Cognition Overall Cognitive Status: Within Functional Limits for tasks assessed Arousal/Alertness: Awake/alert Orientation Level: Oriented X4 Attention: Focused;Sustained;Selective Focused Attention: Appears intact Sustained Attention: Appears intact Selective Attention: Appears intact Memory: Appears intact Awareness: Appears intact Problem Solving: Appears intact Sensation Sensation Light Touch: Appears Intact Stereognosis: Appears Intact Hot/Cold:  Appears Intact  Proprioception: Appears Intact Coordination Gross Motor Movements are Fluid and Coordinated: Yes Fine Motor Movements are Fluid and Coordinated: Yes Motor  Motor Motor: Within Functional Limits  Mobility Bed Mobility Bed Mobility: Right Sidelying to Sit;Left Sidelying to Sit Right Sidelying to Sit: 3: Mod assist Left Sidelying to Sit: 3: Mod assist Transfers Transfers: Yes Sit to Stand: 3: Mod assist Locomotion  Ambulation Ambulation: Yes Ambulation/Gait Assistance: 4: Min assist Ambulation Distance (Feet): 35 Feet Assistive device: Rolling walker Gait Gait: Yes Gait Pattern: Step-to pattern;Antalgic Gait velocity: decreased Stairs / Additional Locomotion Stairs: No Architect: Yes Wheelchair Assistance: 4: Energy manager: Both upper extremities Wheelchair Parts Management: Needs assistance Distance: 50  Trunk/Postural Assessment  Cervical Assessment Cervical Assessment: Within Functional Limits Thoracic Assessment Thoracic Assessment: Exceptions to [facility] Lumbar Assessment Lumbar Assessment: Within Functional Limits Postural Control Postural Control: Within Functional Limits  Balance Balance Balance Assessed: Yes Dynamic Sitting Balance Dynamic Sitting - Balance Support: Feet supported Sitting balance - Comments: Able to adjust brace sitting EOB without difficulty.  Dynamic Standing Balance Dynamic Standing - Balance Support: Bilateral upper extremity supported Extremity Assessment  RLE Assessment RLE Assessment: Exceptions to [facility] RLE Strength RLE Overall Strength: Deficits LLE Assessment LLE Assessment: Exceptions to [facility] LLE Strength LLE Overall Strength: Deficits   See Function Navigator for Current Functional Status.   Refer to Care Plan for Long Term Goals  Recommendations for other services: Neuropsych  Discharge Criteria: Patient will be discharged from PT if patient refuses  treatment 3 consecutive times without medical reason, if treatment goals not met, if there is a change in medical status, if patient makes no progress towards goals or if patient is discharged from hospital.  The above assessment, treatment plan, treatment alternatives and goals were discussed and mutually agreed upon: by patient  Guadlupe Spanish 08/19/2016, 12:40 PM

## 2016-08-20 ENCOUNTER — Inpatient Hospital Stay (HOSPITAL_COMMUNITY): Payer: Medicare Other | Admitting: Occupational Therapy

## 2016-08-20 ENCOUNTER — Inpatient Hospital Stay (HOSPITAL_COMMUNITY): Payer: Medicare Other

## 2016-08-20 DIAGNOSIS — R609 Edema, unspecified: Secondary | ICD-10-CM

## 2016-08-20 MED ORDER — FENTANYL 12 MCG/HR TD PT72
12.5000 ug | MEDICATED_PATCH | TRANSDERMAL | Status: DC
  Administered 2016-08-20: 12.5 ug via TRANSDERMAL
  Filled 2016-08-20: qty 1

## 2016-08-20 MED ORDER — DARBEPOETIN ALFA 200 MCG/0.4ML IJ SOSY
200.0000 ug | PREFILLED_SYRINGE | INTRAMUSCULAR | Status: DC
  Administered 2016-08-30: 200 ug via INTRAVENOUS
  Filled 2016-08-20 (×2): qty 0.4

## 2016-08-20 NOTE — Progress Notes (Signed)
Subjective: Interval History: tired from w/o yest, no C/O  Objective: Vital signs in last 24 hours: Temp:  [98.1 F (36.7 C)-99 F (37.2 C)] 98.1 F (36.7 C) (10/29 0544) Pulse Rate:  [51-90] 90 (10/29 0544) Resp:  [18] 18 (10/29 0544) BP: (114-131)/(81-86) 131/86 (10/29 0544) SpO2:  [100 %] 100 % (10/29 0544) Weight:  [54.8 kg (120 lb 14.4 oz)] 54.8 kg (120 lb 14.4 oz) (10/29 0544) Weight change: -0.045 kg (-1.6 oz)  Intake/Output from previous day: 10/28 0701 - 10/29 0700 In: 720 [P.O.:720] Out: -  Intake/Output this shift: No intake/output data recorded.  General appearance: alert, cooperative and no distress Resp: clear to auscultation bilaterally Cardio: S1, S2 normal, systolic murmur: holosystolic 2/6, blowing throughout the precordium and cannot hear Rub today GI: soft, pos bs, liver down 5 cm Extremities: L fem cath  Lab Results:  Recent Labs  08/18/16 0726  WBC 9.4  HGB 8.8*  HCT 28.1*  PLT 249   BMET:  Recent Labs  08/18/16 0726  NA 141  K 4.0  CL 102  CO2 25  GLUCOSE 88  BUN 50*  CREATININE 5.79*  CALCIUM 9.8   No results for input(s): PTH in the last 72 hours. Iron Studies:  Recent Labs  08/18/16 0829  IRON 17*  TIBC 158*    Studies/Results: No results found.  I have reviewed the patient's current medications.  Assessment/Plan: 1 ESRD for Hd tomorrow.  Some vol xs, Pericarditis resolving 2 HTN better with vol off  3 Anemia on esa 4 HPTH On Vit D, Cinnacalcet 5 NONADHERENCE doubt will change 6 Vert fx per rehab 7 Pericarditis better P HD, counseled, Vit D, Cinn   LOS: 2 days   Heman Que L 08/20/2016,8:35 AM

## 2016-08-20 NOTE — Progress Notes (Signed)
Small amount of bright red blood on toilet tissue reported by NT.  No further bleeding noted.  Patient c/o lower abdominal discomfort relieved by pain medication.

## 2016-08-20 NOTE — Progress Notes (Signed)
Orthopedic Tech Progress Note Patient Details:  [name] Small 24-Sep-1991 [id number]  Patient ID: [name], female   DOB: 1991/04/19, 25 y.o.   MRN: [id number]   [name] 08/20/2016, 11:53 AM RN stated that pt already has TLSO Brace

## 2016-08-20 NOTE — Progress Notes (Signed)
[place] PHYSICAL MEDICINE & REHABILITATION     PROGRESS NOTE    Subjective/Complaints: Pain better with dilaudid but it makes her sleepy. Interested in starting "pain patch".   ROS: Pt denies fever, rash/itching, headache, blurred or double vision, nausea, vomiting, abdominal pain, diarrhea, chest pain, shortness of breath, palpitations, dysuria, dizziness,  bleeding, anxiety, or depression   Objective: Vital Signs: Blood pressure 131/86, pulse 90, temperature 98.1 F (36.7 C), temperature source Oral, resp. rate 18, height 5\' 3"  (1.6 m), weight 54.8 kg (120 lb 14.4 oz), SpO2 100 %. No results found.  Recent Labs  08/18/16 0726  WBC 9.4  HGB 8.8*  HCT 28.1*  PLT 249    Recent Labs  08/18/16 0726  NA 141  K 4.0  CL 102  GLUCOSE 88  BUN 50*  CREATININE 5.79*  CALCIUM 9.8   CBG (last 3)  No results for input(s): GLUCAP in the last 72 hours.  Wt Readings from Last 3 Encounters:  08/20/16 54.8 kg (120 lb 14.4 oz)  08/18/16 50.8 kg (111 lb 15.9 oz)  08/04/16 51.2 kg (112 lb 14 oz)    Physical Exam:  Constitutional: She is oriented to person, place, and time. She appears well-developed.  Small frame  HENT:  Head: Normocephalic and atraumatic.  Right Ear: External ear normal.  Left Ear: External ear normal.  Eyes: Conjunctivae are normal. Pupils are equal, round, and reactive to light.  Neck: No tracheal deviation present. No thyromegaly present.  Cardiovascular: Normal rate, regular rhythm and intact distal pulses.  Exam reveals no gallop and no friction rub.   No murmur  Respiratory: No respiratory distress. She has no wheezes. She has no rales. She exhibits no tenderness  GI: She exhibits no distension. There is no tenderness. There is no rebound.  Musculoskeletal: She exhibits trace LE edema.  Mid and low back tender to palp Neurological: She is oriented to person, place, and time. No cranial nerve deficit. Coordination normal.  Strength 4/4 UE prox to  distal. 4-HF with pain inhibition. KE and ADF/PF 4/5. No sensory abnormalities seen.   Skin: No rash noted. No erythema.    Assessment/Plan: 1. Functional and mobility deficits  secondary to thoracic compression fractures which require 3+ hours per day of interdisciplinary therapy in a comprehensive inpatient rehab setting. Physiatrist is providing close team supervision and 24 hour management of active medical problems listed below. Physiatrist and rehab team continue to assess barriers to discharge/monitor patient progress toward functional and medical goals.  Function:  Bathing Bathing position   Position: Wheelchair/chair at sink  Bathing parts Body parts bathed by patient: Right arm, Left arm, Chest, Abdomen, Right upper leg, Left upper leg Body parts bathed by helper: Right lower leg, Left lower leg  Bathing assist        Upper Body Dressing/Undressing Upper body dressing   What is the patient wearing?: Pull over shirt/dress, Orthosis     Pull over shirt/dress - Perfomed by patient: Thread/unthread right sleeve, Thread/unthread left sleeve, Put head through opening, Pull shirt over trunk       Orthosis activity level: Performed by patient  Upper body assist Assist Level: Set up      Lower Body Dressing/Undressing Lower body dressing   What is the patient wearing?: Socks (Pt already with underpants and shorts did not attempt to change secondary to female therapist)             Socks - Performed by patient: Don/doff right sock, Don/doff  left sock                Lower body assist Assist for lower body dressing: Supervision or verbal cues      Toileting Toileting          Toileting assist     Transfers Chair/bed transfer   Chair/bed transfer method: Stand pivot Chair/bed transfer assist level: Touching or steadying assistance (Pt > 75%) Chair/bed transfer assistive device: Patent attorneyWalker     Locomotion Ambulation     Max distance: 33 Assist level:  Touching or steadying assistance (Pt > 75%)   Wheelchair   Type: Manual Max wheelchair distance: 75 Assist Level: Touching or steadying assistance (Pt > 75%)  Cognition Comprehension Comprehension assist level: Follows complex conversation/direction with no assist  Expression Expression assist level: Expresses complex ideas: With no assist  Social Interaction Social Interaction assist level: Interacts appropriately with others - No medications needed.  Problem Solving Problem solving assist level: Solves complex problems: Recognizes & self-corrects  Memory Memory assist level: Complete Independence: No helper   Medical Problem List and Plan: 1.  Weakness and functional deficits due to thoracic compression fractures, T7-T8 secondary to recent fall.  - Back brace when out of bed             -continue therapies 2.  DVT Prophylaxis/Anticoagulation: SCD.Check vascular study 3. Pain Management with a history of narcotic abuse: Naprosyn 500 mg BID, Oxycodone and Robaxin as needed  -continue dilaudid for more severe pain---pt wants to wean off---will change to q8 prn  -will try low dose fentanyl patch for more consistent pain control--may not be ideal given HD however 4. Mood: Zoloft 100 mg daily 5. Neuropsych: This patient is capable of making decisions on her own behalf. 6. Skin/Wound Care: Routine skin checks 7. 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 continue per [facility]  -HD after therapies 9.NICM/AICD.EF 12 % 10. Acute on chronic anemia. Follow-up CBC 11. Medical noncompliance. Counseling   LOS (Days) 2 A FACE TO FACE EVALUATION WAS PERFORMED  SWARTZ,ZACHARY T 08/20/2016 8:12 AM

## 2016-08-20 NOTE — Progress Notes (Signed)
VASCULAR LAB PRELIMINARY  PRELIMINARY  PRELIMINARY  PRELIMINARY  Bilateral lower extremity venous duplex  Preliminary report:  Bilateral:  No evidence of acute DVT, superficial thrombosis, or Baker's Cyst. 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

## 2016-08-20 NOTE — Progress Notes (Signed)
Occupational Therapy Session Note  Patient Details  Name: [name] Small MRN: [id number] Date of Birth: 1990/12/19  Today's Date: 08/20/2016 OT Individual Time: 6184-8592 OT Individual Time Calculation (min): 43 min   Short Term Goals: Week 1:  OT Short Term Goal 1 (Week 1): Pt will complete LB bathing sit to stand with min assist using AE PRN. OT Short Term Goal 2 (Week 1): Pt will complete LB dressing with min assist sit to stand.  OT Short Term Goal 3 (Week 1): Pt will perform toilet transfers with min assist using the RW and 3:1. OT Short Term Goal 4 (Week 1): Pt will perform RUE AAROM exercises with modified independence in order to increase AROM for grooming and selfcare tasks.   Skilled Therapeutic Interventions/Progress Updates:   Pt participated in skilled OT session focusing on caregiver education, activity tolerance and standing endurance. Pt was seated at EOB with family present at time of arrival. Mother [name] Small) was agreeable to participate in education regarding donning/doffing TLSO. After receiving instruction and visual demonstrations, [name] Small demonstrated carryover of education with steup. Afterwards pt was agreeable to participate in tx in gym. Stand pivot completed with RW and Mod A to w/c. Pt self propelled to gym with min cues for pathfinding. Pt completed static standing task with unilateral UE support on walker and min guard. Longest standing time without rest 13 minutes. After activity, pt self propelled back to room (able to find without cuing) and was left with family and all needs within reach.   Therapy Documentation Precautions:  Precautions Precautions: Fall, Back Precaution Booklet Issued: No Precaution Comments: Patient able to state precautions. Required Braces or Orthoses: Spinal Brace Spinal Brace: Thoracolumbosacral orthotic Restrictions Weight Bearing Restrictions: No   Pain: No c/o pain during session  Pain Assessment Pain Score: 4  ADL:   See  Function Navigator for Current Functional Status.   Therapy/Group: Individual Therapy  Geralda Baumgardner A Laken Lobato 08/20/2016, 7:28 PM

## 2016-08-21 ENCOUNTER — Inpatient Hospital Stay (HOSPITAL_COMMUNITY): Payer: Medicare Other | Admitting: Occupational Therapy

## 2016-08-21 ENCOUNTER — Inpatient Hospital Stay (HOSPITAL_COMMUNITY): Payer: Medicare Other | Admitting: Physical Therapy

## 2016-08-21 DIAGNOSIS — I82519 Chronic embolism and thrombosis of unspecified femoral vein: Secondary | ICD-10-CM

## 2016-08-21 DIAGNOSIS — I428 Other cardiomyopathies: Secondary | ICD-10-CM

## 2016-08-21 DIAGNOSIS — G894 Chronic pain syndrome: Secondary | ICD-10-CM

## 2016-08-21 DIAGNOSIS — Z9114 Patient's other noncompliance with medication regimen: Secondary | ICD-10-CM

## 2016-08-21 DIAGNOSIS — D638 Anemia in other chronic diseases classified elsewhere: Secondary | ICD-10-CM

## 2016-08-21 LAB — CBC
HCT: 27.8 % — ABNORMAL LOW (ref 36.0–46.0)
Hemoglobin: 8.6 g/dL — ABNORMAL LOW (ref 12.0–15.0)
MCH: 27 pg (ref 26.0–34.0)
MCHC: 30.9 g/dL (ref 30.0–36.0)
MCV: 87.1 fL (ref 78.0–100.0)
PLATELETS: 308 10*3/uL (ref 150–400)
RBC: 3.19 MIL/uL — AB (ref 3.87–5.11)
RDW: 19.4 % — AB (ref 11.5–15.5)
WBC: 7.7 10*3/uL (ref 4.0–10.5)

## 2016-08-21 LAB — RENAL FUNCTION PANEL
Albumin: 3 g/dL — ABNORMAL LOW (ref 3.5–5.0)
Anion gap: 13 (ref 5–15)
BUN: 67 mg/dL — AB (ref 6–20)
CALCIUM: 9.5 mg/dL (ref 8.9–10.3)
CHLORIDE: 100 mmol/L — AB (ref 101–111)
CO2: 24 mmol/L (ref 22–32)
CREATININE: 7.24 mg/dL — AB (ref 0.44–1.00)
GFR calc Af Amer: 8 mL/min — ABNORMAL LOW (ref >60)
GFR calc non Af Amer: 7 mL/min — ABNORMAL LOW (ref >60)
GLUCOSE: 94 mg/dL (ref 65–99)
Phosphorus: 5.1 mg/dL — ABNORMAL HIGH (ref 2.5–4.6)
Potassium: 4.8 mmol/L (ref 3.5–5.1)
SODIUM: 137 mmol/L (ref 135–145)

## 2016-08-21 MED ORDER — SODIUM CHLORIDE 0.9 % IV SOLN
250.0000 mg | INTRAVENOUS | Status: AC
  Administered 2016-08-21 – 2016-08-28 (×4): 250 mg via INTRAVENOUS
  Filled 2016-08-21 (×8): qty 20

## 2016-08-21 MED ORDER — NEPRO/CARBSTEADY PO LIQD
237.0000 mL | Freq: Two times a day (BID) | ORAL | Status: DC
  Administered 2016-08-22 – 2016-09-01 (×5): 237 mL via ORAL

## 2016-08-21 MED ORDER — HYDROXYZINE HCL 25 MG PO TABS
ORAL_TABLET | ORAL | Status: AC
Start: 2016-08-21 — End: 2016-08-21
  Filled 2016-08-21: qty 1

## 2016-08-21 MED ORDER — HYDROXYZINE HCL 25 MG PO TABS
25.0000 mg | ORAL_TABLET | Freq: Once | ORAL | Status: AC
  Administered 2016-08-21: 25 mg via ORAL

## 2016-08-21 NOTE — Progress Notes (Signed)
[facility] PHYSICAL MEDICINE & REHABILITATION     PROGRESS NOTE    Subjective/Complaints: Pt sitting up in bed this AM.  She states she slept well overnight.  Per nursing, pt with BRB[facility] yesterday, however, pt denies complaints this AM.    ROS: Denies CP, SOB, N/V/D.   Objective: Vital Signs: Blood pressure 108/86, pulse (!) 55, temperature 97.8 F (36.6 C), temperature source Oral, resp. rate 18, height 5\' 3"  (1.6 m), weight 54.7 kg (120 lb 9.5 oz), SpO2 100 %. No results found. No results for input(s): WBC, HGB, HCT, PLT in the last 72 hours. No results for input(s): NA, K, CL, GLUCOSE, BUN, CREATININE, CALCIUM in the last 72 hours.  Invalid input(s): CO CBG (last 3)  No results for input(s): GLUCAP in the last 72 hours.  Wt Readings from Last 3 Encounters:  08/21/16 54.7 kg (120 lb 9.5 oz)  08/18/16 50.8 kg (111 lb 15.9 oz)  08/04/16 51.2 kg (112 lb 14 oz)    Physical Exam:  Constitutional: She appears well-developed. Small stature. NAD. HENT: Normocephalic and atraumatic.  Eyes: EOMI.  No draingage  Cardiovascular: Normal rate, regular rhythm. No JVD. LUE fistula Respiratory: No respiratory distress. She has no wheezes. She has no rales. She exhibits no tenderness  GI: BS+. She exhibits no distension. There is no tenderness. There is no rebound.  Musculoskeletal: She exhibits trace LE edema. Mid and low back TTP. Neurological: She is alert and oriented  Strength 4/4 UE prox to distal.  B/l LE 3/5 HF, KE, ankle dorsi/plantar flexion 4+/5 Sensation intact to light touch Skin: No rash noted. No erythema. Warm and dry.  Assessment/Plan: 1. Functional and mobility deficits  secondary to thoracic compression fractures which require 3+ hours per day of interdisciplinary therapy in a comprehensive inpatient rehab setting. Physiatrist is providing close team supervision and 24 hour management of active medical problems listed below. Physiatrist and rehab team continue to  assess barriers to discharge/monitor patient progress toward functional and medical goals.  Function:  Bathing Bathing position   Position: Wheelchair/chair at sink  Bathing parts Body parts bathed by patient: Right arm, Left arm, Chest, Abdomen, Right upper leg, Left upper leg Body parts bathed by helper: Right lower leg, Left lower leg  Bathing assist        Upper Body Dressing/Undressing Upper body dressing   What is the patient wearing?: Pull over shirt/dress, Orthosis     Pull over shirt/dress - Perfomed by patient: Thread/unthread right sleeve, Thread/unthread left sleeve, Put head through opening, Pull shirt over trunk       Orthosis activity level: Performed by patient  Upper body assist Assist Level: Set up      Lower Body Dressing/Undressing Lower body dressing   What is the patient wearing?: Socks (Pt already with underpants and shorts did not attempt to change secondary to female therapist)             Socks - Performed by patient: Don/doff right sock, Don/doff left sock                Lower body assist Assist for lower body dressing: Supervision or verbal cues      Toileting Toileting Toileting activity did not occur: No continent bowel/bladder event        Toileting assist     Transfers Chair/bed transfer   Chair/bed transfer method: Stand pivot Chair/bed transfer assist level: Moderate assist (Pt 50 - 74%/lift or lower) Chair/bed transfer assistive device: Dan HumphreysWalker  Locomotion Ambulation     Max distance: 33 Assist level: Touching or steadying assistance (Pt > 75%)   Wheelchair   Type: Manual Max wheelchair distance: 75 Assist Level: Touching or steadying assistance (Pt > 75%)  Cognition Comprehension Comprehension assist level: Follows complex conversation/direction with no assist  Expression Expression assist level: Expresses complex ideas: With no assist  Social Interaction Social Interaction assist level: Interacts  appropriately with others - No medications needed.  Problem Solving Problem solving assist level: Solves complex problems: Recognizes & self-corrects  Memory Memory assist level: Complete Independence: No helper   Medical Problem List and Plan: 1.  Weakness and functional deficits due to thoracic compression fractures, T7-T8 secondary to recent fall.  Cont CIR  -Back brace when out of bed             -continue therapies 2.  DVT Prophylaxis/Anticoagulation:   Vascular study showing chronic DVT  Will inquire about long-term therapy, currently not on medication 3. Pain Management with a history of narcotic abuse:   Naprosyn 500 mg BID  Oxycodone PRN, will increase as necessary  Robaxin as needed  Dilaudid for more severe pain, d/ced on 10/30  Will d/c fentanyl 11/1 4. Mood: Zoloft 100 mg daily 5. Neuropsych: This patient is capable of making decisions on her own behalf. 6. Skin/Wound Care: Routine skin checks 7. 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 Acute on chronic anemia.   Hb 8.8 on 10/27  Will hemocult stools  Cont to monitor 11. Medical noncompliance. Counseling   LOS (Days) 3 A FACE TO FACE EVALUATION WAS PERFORMED  [name] Small [name] Small 08/21/2016 8:28 AM

## 2016-08-21 NOTE — Progress Notes (Signed)
   [facility] Progress Note   Subjective: up bathing, standing , bad dreams after starting duragesic patch per pt  Vitals:   08/19/16 1257 08/20/16 0544 08/20/16 1242 08/21/16 0554  BP: 114/81 131/86 130/89 108/86  Pulse: (!) 51 90 93 (!) 55  Resp: 18 18 18 18   Temp: 99 F (37.2 C) 98.1 F (36.7 C) 98.5 F (36.9 C) 97.8 F (36.6 C)  TempSrc: Oral Oral Oral Oral  SpO2: 100% 100% 100% 100%  Weight:  54.8 kg (120 lb 14.4 oz)  54.7 kg (120 lb 9.5 oz)  Height:        Inpatient medications: . calcitonin (salmon)  1 spray Alternating Nares Daily  . calcitRIOL  1 mcg Oral Q M,W,F-HD  . cinacalcet  90 mg Oral Q supper  . [START ON 08/23/2016] darbepoetin (ARANESP) injection - DIALYSIS  200 mcg Intravenous Q Wed-HD  . feeding supplement (NEPRO CARB STEADY)  237 mL Oral BID BM  . fentaNYL  12.5 mcg Transdermal Q72H  . ferric gluconate (FERRLECIT/NULECIT) IV  250 mg Intravenous Q M,W,F-HD  . multivitamin  1 tablet Oral QHS  . naproxen  500 mg Oral BID WC  . sertraline  100 mg Oral Daily  . sevelamer carbonate  2,400 mg Oral TID WC     famotidine, heparin, methocarbamol, oxyCODONE, sorbitol  Exam: Alert, large back brace Chest clear Neck no jvd Cor: RRR Abd: soft ntnd Ext: muscle loss LE's , no edema Tunneled HD cath L thigh  Dialysis: [place]/ [facility] MWF 4h  2/2 bath  51kg   Hep 2000  L fem perm cath (Dr Wyn Quaker placed cath) hect 1ug, no esa/ fe       Assessment: 1  ESRD HD mwf 2  Pericarditis resolving 3  HTN better w vol lower 4  Vol xs resolved  5  HTPH cont vD, cinacalcet 6  Vert fx per rehab 7  Nonadherence major issue  Plan - HD today   Vinson Moselle MD [facility] pager [phone]   08/21/2016, 11:59 AM    Recent Labs Lab 08/15/16 0716 08/16/16 0700 08/18/16 0726  NA 134* 140 141  K 4.2 4.1 4.0  CL 97* 101 102  CO2 23 26 25   GLUCOSE 76 79 88  BUN 45* 35* 50*  CREATININE 6.93* 5.07* 5.79*  CALCIUM 8.0* 9.5 9.8   PHOS 5.1* 5.5* 5.4*    Recent Labs Lab 08/15/16 0716 08/16/16 0700 08/18/16 0726  AST 24  --   --   ALT <5*  --   --   ALKPHOS 259*  --   --   BILITOT 1.3*  --   --   PROT 6.7  --   --   ALBUMIN 2.7* 2.7* 2.8*    Recent Labs Lab 08/15/16 0716 08/16/16 0700 08/18/16 0726  WBC 9.1 9.5 9.4  HGB 9.0* 9.1* 8.8*  HCT 28.6* 29.3* 28.1*  MCV 87.7 87.5 87.8  PLT 196 241 249   Iron/TIBC/Ferritin/ %Sat    Component Value Date/Time   IRON 17 (L) 08/18/2016 0829   TIBC 158 (L) 08/18/2016 0829   FERRITIN 576 (H) 02/22/2015 0619   IRONPCTSAT 11 08/18/2016 0829

## 2016-08-21 NOTE — Progress Notes (Signed)
Occupational Therapy Session Note  Patient Details  Name: [name] Small MRN: [id number] Date of Birth: [birth year]  Today's Date: 08/21/2016 OT Individual Time: 1100-1158 OT Individual Time Calculation (min): 58 min    Short Term Goals: Week 1:  OT Short Term Goal 1 (Week 1): Pt will complete LB bathing sit to stand with min assist using AE PRN. OT Short Term Goal 2 (Week 1): Pt will complete LB dressing with min assist sit to stand.  OT Short Term Goal 3 (Week 1): Pt will perform toilet transfers with min assist using the RW and 3:1. OT Short Term Goal 4 (Week 1): Pt will perform RUE AAROM exercises with modified independence in order to increase AROM for grooming and selfcare tasks.   Skilled Therapeutic Interventions/Progress Updates:   Pt participated in skilled OT session focusing on adaptive bathing/dressing, functional transfers, and adherence to back precautions. Pt completed ADLs w/c level at sink with steady assist for standing as needed and instruction on use of LH sponge. Pt instructed on figure 4 position for donning pants and socks. TLSO setup provided from behind pt for adherence to precautions. Afterwards pt self propelled to tub room to practice simulated shower transfer. Pt reported not being able to shower for past  year due to femoral cath. Pt educated on tub bench as shower option once femoral cath is no longer in place. Pt completed with Mod A for elevating LEs and while maintaining back precautions. Rest breaks provided for pain throughout tx. At end of session pt self propelled to room and was set up for lunch. All needs within reach.   Therapy Documentation Precautions:  Precautions Precautions: Fall, Back Precaution Booklet Issued: No Precaution Comments: Patient able to state precautions. Required Braces or Orthoses: Spinal Brace Spinal Brace: Thoracolumbosacral orthotic Restrictions Weight Bearing Restrictions: No  Pain: Pt reported pain to be tolerable  during session  Pain Assessment Pain Assessment: 0-10 Pain Score: 8  Pain Type: Acute pain Pain Location: Back Pain Orientation: Mid Pain Descriptors / Indicators: Aching Pain Frequency: Intermittent Pain Onset: On-going Pain Intervention(s): Medication (See eMAR)    See Function Navigator for Current Functional Status.   Therapy/Group: Individual Therapy  [name] Small A Arif Amendola 08/21/2016, 12:32 PM

## 2016-08-21 NOTE — Plan of Care (Signed)
Problem: RH Tub/Shower Transfers Goal: LTG Patient will perform tub/shower transfers w/assist (OT) LTG: Patient will perform tub/shower transfers with assist, with/without cues using equipment (OT)  Outcome: Not Applicable Date Met: 18/40/37 D/C pt unable to shower 2/2 femoral cath

## 2016-08-21 NOTE — Progress Notes (Signed)
Social Work Patient ID: [name] Small, female   DOB: 1991-03-22, 25 y.o.   MRN: [id number]   Attempted to see pt this afternoon to complete psychosocial assessment interview, however, had already been taken for HD.  Will follow up with her tomorrow.  [name], LCSW

## 2016-08-21 NOTE — Progress Notes (Signed)
Patient information reviewed and entered into eRehab system by Hanne Kegg, RN, CRRN, PPS Coordinator.  Information including medical coding and functional independence measure will be reviewed and updated through discharge.     Per nursing patient was given "Data Collection Information Summary for Patients in Inpatient Rehabilitation Facilities with attached "Privacy Act Statement-Health Care Records" upon admission.  

## 2016-08-21 NOTE — Progress Notes (Signed)
Physical Therapy Session Note  Patient Details  Name: [name] Small MRN: [id number] Date of Birth: [birth year]  Today's Date: 08/21/2016 PT Individual Time: 0370-4888 PT Individual Time Calculation (min): 59 min    Short Term Goals: Week 1:  PT Short Term Goal 1 (Week 1): Patient will be able to complete supine to sit transfer with supervision PT Short Term Goal 2 (Week 1): Patient will be able to perform bed to w/c transfer with Supervision PT Short Term Goal 3 (Week 1): Patient will be able to  cover the distance of 75 feet with RW with Supervision PT Short Term Goal 4 (Week 1): Patient will be able to perform car transfer with Supervision to regular height car and min A for SUV PT Short Term Goal 5 (Week 1): Patient will be able to perform standing activities for 5 min w/o rest break and w/o LOB, no UE support  Skilled Therapeutic Interventions/Progress Updates:  Pt received seated upright in bed with LE crossed and TLSO donned; pt reports donning TLSO herself in sitting.  Will clarify donning/doffing position with PA.  Had lengthy discussion with pt regarding mechanism of injury of spinal fractures/fall, old pelvic fracture, apartment set up, equipment/assistance available at D/C, transportation options, pain and wound on L calf.  Pt reports fall happened when she was reaching too far forwards from sitting position on bed and falling on face; pt denies headache, changes in vision, hearing and no dizziness.  Pt reports pelvis fracture happened due to osteoporosis.  Pt performed transfer to EOB with supervision and performed sit > stand from bed and performed stand pivot to w/c with RW and mod A.  Pt reports that the RW she has at home is one she found at her apartment but it is too wide; RW noted to be wide RW.  Pt is interested in use of rollator at D/C to have seat to rest on.  Performed gait assessment forwards and backwards x 2 and x 15' with rollator and min-mod A with verbal cues for  sequencing brakes and safety with rollator.  Discussed transportation to/from HD.  Pt reports using public [facility]; reports she can't always sit in the front seat and sometimes the driver has to help lift her into the back of the van and sometimes she crawls to the seat.  Will discuss with Child psychotherapist.  Performed transfer to elevated seat with RW and mod A with assistance to bring each LE into and out of car.  Returned to w/c and discussed set up of parent's home; pt reports 3 STE with two wide rails with brother and father assisting her up/down stairs.  She reports their floor is very slippery wood floors; discussed talking to her parents about putting down a carpet runner to ambulate more safely between rooms.  Performed one step negotiation training up one step forwards, descending backwards with bilat UE support on rails and mod A.  Returned to w/c and returned to room to rest before OT session.    Therapy Documentation Precautions:  Precautions Precautions: Fall, Back Precaution Booklet Issued: No Precaution Comments: Patient able to state precautions. Required Braces or Orthoses: Spinal Brace Spinal Brace: Thoracolumbosacral orthotic Restrictions Weight Bearing Restrictions: No Vital Signs: Therapy Vitals Temp: 97.4 F (36.3 C) Temp Source: Oral Pulse Rate: 95 Resp: 16 BP: 132/89 Patient Position (if appropriate): Lying Oxygen Therapy SpO2: 96 % O2 Device: Not Delivered Pain: Pain Assessment Pain Assessment: Faces Pain Score: Asleep Pain Type: Acute pain Pain Location: Back  Pain Orientation: Mid Pain Intervention(s): Relaxation   See Function Navigator for Current Functional Status.   Therapy/Group: Individual Therapy  Edman CircleHall, Audra Faucette 08/21/2016, 5:05 PM

## 2016-08-21 NOTE — Progress Notes (Signed)
        Occupational Therapy Session Note  Patient Details  Name: [name] Small MRN: [id number] Date of Birth: [birth year]  Today's Date: 08/21/2016 OT Individual Time: 1300-1413 OT Individual Time Calculation (min): 73 min    Short Term Goals: Week 1:  OT Short Term Goal 1 (Week 1): Pt will complete LB bathing sit to stand with min assist using AE PRN. OT Short Term Goal 2 (Week 1): Pt will complete LB dressing with min assist sit to stand.  OT Short Term Goal 3 (Week 1): Pt will perform toilet transfers with min assist using the RW and 3:1. OT Short Term Goal 4 (Week 1): Pt will perform RUE AAROM exercises with modified independence in order to increase AROM for grooming and selfcare tasks.   Skilled Therapeutic Interventions/Progress Updates:    Upon entering the room, pt seated in wheelchair with 5/10 c/o pain in back. Pt verbalized 2/3 back precautions correctly. OT discussed pain management with pt and pt verbalized understanding. Pt reporting extreme fatigue with activities and OT providing education regarding energy conservation for self care and general principles. Pt verbalized understanding but education to continue. Pt propelled wheelchair to ADL apartment with B UE's and increased time. OT provided education regarding home set up and how to utilize RW and countertops to obtain items and move from various locations safely. Pt demonstrated understanding with steady assistance and use of RW. Pt returning to room for toileting with min A for clothing management but mod lifting assistance to stand from toilet. Pt remained in wheelchair at end of session with call bell and all needed items within reach upon exiting the room.   Therapy Documentation Precautions:  Precautions Precautions: Fall, Back Precaution Booklet Issued: No Precaution Comments: Patient able to state precautions. Required Braces or Orthoses: Spinal Brace Spinal Brace: Thoracolumbosacral  orthotic Restrictions Weight Bearing Restrictions: No  Pain: Pt reported pain to be tolerable during session  Pain Assessment Pain Assessment: 0-10 Pain Score: 5 Pain Type: Acute pain Pain Location: Back Pain Orientation: Mid Pain Descriptors / Indicators: Aching Pain Frequency: Intermittent Pain Onset: On-going Pain Intervention(s): Medication (See eMAR)    See Function Navigator for Current Functional Status.   Therapy/Group: Individual Therapy  Zira Helinski L. Pittman 08/21/2016, 12:32 PM

## 2016-08-21 NOTE — IPOC Note (Addendum)
Overall Plan of Care [facility]) Patient Details Name: [name] Small MRN: [id number] DOB: February 13, 1991  Admitting Diagnosis: debility  Hospital Problems: Principal Problem:   Thoracic compression fracture - T7, T8, T12 Active Problems:   Acute on chronic systolic heart failure (HCC)   ESRD on dialysis (HCC)   NICM (nonischemic cardiomyopathy) (HCC)   Depression with anxiety   Chronic deep vein thrombosis (DVT) of femoral vein (HCC)   Anemia of chronic disease   H/O medication noncompliance     Functional Problem List: Nursing Behavior, Edema, Endurance, Medication Management, Nutrition, Pain, Safety, Skin Integrity, Motor  PT Balance, Endurance, Motor, Pain, Safety  OT Balance, Pain, Endurance  SLP    TR         Basic ADL's: OT Grooming, Bathing, Dressing, Toileting     Advanced  ADL's: OT Simple Meal Preparation, Light Housekeeping     Transfers: PT Bed to Chair, Banker, Toilet     Locomotion: PT Ambulation, Psychologist, prison and probation services, Stairs     Additional Impairments: OT Fuctional Use of Upper Extremity  SLP        TR      Anticipated Outcomes Item Anticipated Outcome  Self Feeding independent  Swallowing      Basic self-care  supervision  Toileting  modified independent level   Bathroom Transfers supervision  Bowel/Bladder  continent of bowel and bladder(dialysis pt)  Transfers  Mod I   Locomotion  Mod I   Communication     Cognition     Pain  less<4  Safety/Judgment  free of falls and injuries with min assist.   Therapy Plan: PT Intensity: Minimum of 1-2 x/day ,45 to 90 minutes PT Frequency: Total of 15 hours over 7 days of combined therapies PT Duration Estimated Length of Stay: 12-14days OT Intensity: Minimum of 1-2 x/day, 45 to 90 minutes OT Frequency: 5 out of 7 days OT Duration/Estimated Length of Stay: 10-1`2 days         Team Interventions: Nursing Interventions Disease Management/Prevention, Patient/Family Education,  Pain Management, Medication Management, Skin Care/Wound Management, Cognitive Remediation/Compensation, Discharge Planning, Psychosocial Support  PT interventions Ambulation/gait training, Neuromuscular re-education, Stair training, Therapeutic Activities, Therapeutic Exercise, Patient/family education, Functional mobility training, UE/LE Strength taining/ROM, UE/LE Coordination activities  OT Interventions Balance/vestibular training, Functional mobility training, Patient/family education, Neuromuscular re-education, DME/adaptive equipment instruction, Self Care/advanced ADL retraining, Therapeutic Activities, UE/LE Coordination activities, UE/LE Strength taining/ROM, Therapeutic Exercise, Discharge planning  SLP Interventions    TR Interventions    SW/CM Interventions Discharge Planning, Psychosocial Support, Patient/Family Education    Team Discharge Planning: Destination: PT-Home ,OT- Home , SLP-  Projected Follow-up: PT-Home health PT, OT-  Home health OT, 24 hour supervision/assistance, SLP-  Projected Equipment Needs: PT-To be determined, OT- 3 in 1 bedside comode, Tub/shower seat, SLP-  Equipment Details: PT- , OT-  Patient/family involved in discharge planning: PT- Patient, Family member/caregiver,  OT-Patient, SLP-   MD ELOS: 10-14 days. Medical Rehab Prognosis:  Good Assessment: 25 y.o.right handed femalewith history significant for ESRD on hemodialysis due to childhood polycystic kidney disease, severe systolic ejection fraction of 25%, AICD,medicalnoncomplianceand narcotic abuse, pulmonary emboli. Patient lives alone. Independent with assistive device prior to admission. She was needing some assistance with ADLs since recent fall.Boyfriend and parents work during the day.Patient with reported recent fall admitted 07/26/2016-08/04/2016 sustaining T7 fracture 18-19 percent, subtle compression deformity superior endplate of T8, probable acute subacute fracture T12. Patient receive  follow-up by Dr. Shon Baton of orthopedic services. Placed in a TLSO  back brace. Discharge to home ambulating with assistive device and home health therapies recommended. Presented 08/13/2016 after missing several recent dialysis treatments due to reported transportation issues. Complaints of increased back pain. Chest x-ray showed basilar atelectasis with small right pleural effusion. Appeared to be a 50% compression fracture of what may represent T12 new since previous thoracic spine CT from 07/27/2016. Findings of fluid overload with follow-up cardiology services received emergent dialysis. Elevated troponin felt to be demand ischemia. Follow-up renal services for ongoing dialysis. Pt with resulting functional deficits with strength, endurance, mobility, balance.  Will set goals for Mod I with most tasks, supervision for self care with therapies.     See Team Conference Notes for weekly updates to the plan of care

## 2016-08-22 ENCOUNTER — Inpatient Hospital Stay (HOSPITAL_COMMUNITY): Payer: Medicare Other | Admitting: Occupational Therapy

## 2016-08-22 ENCOUNTER — Inpatient Hospital Stay (HOSPITAL_COMMUNITY): Payer: Medicare Other | Admitting: Physical Therapy

## 2016-08-22 DIAGNOSIS — D631 Anemia in chronic kidney disease: Secondary | ICD-10-CM

## 2016-08-22 DIAGNOSIS — D62 Acute posthemorrhagic anemia: Secondary | ICD-10-CM

## 2016-08-22 DIAGNOSIS — D509 Iron deficiency anemia, unspecified: Secondary | ICD-10-CM

## 2016-08-22 DIAGNOSIS — I8291 Chronic embolism and thrombosis of unspecified vein: Secondary | ICD-10-CM

## 2016-08-22 DIAGNOSIS — N189 Chronic kidney disease, unspecified: Secondary | ICD-10-CM

## 2016-08-22 MED ORDER — SODIUM CHLORIDE 0.9 % IV SOLN
100.0000 mL | INTRAVENOUS | Status: DC | PRN
Start: 2016-08-22 — End: 2016-08-23

## 2016-08-22 MED ORDER — HEPARIN SODIUM (PORCINE) 1000 UNIT/ML DIALYSIS: 40.0000 [IU]/kg | Freq: Once | INTRAMUSCULAR | Status: DC

## 2016-08-22 MED ORDER — SODIUM CHLORIDE 0.9 % IV SOLN: 100.0000 mL | INTRAVENOUS | Status: DC | PRN

## 2016-08-22 MED ORDER — HEPARIN SODIUM (PORCINE) 1000 UNIT/ML DIALYSIS: 1000.0000 [IU] | INTRAMUSCULAR | Status: DC | PRN

## 2016-08-22 MED ORDER — ALTEPLASE 2 MG IJ SOLR: 2.0000 mg | Freq: Once | INTRAMUSCULAR | Status: DC | PRN

## 2016-08-22 MED ORDER — LIDOCAINE HCL (PF) 1 % IJ SOLN: 5.0000 mL | INTRAMUSCULAR | Status: DC | PRN

## 2016-08-22 MED ORDER — PENTAFLUOROPROP-TETRAFLUOROETH EX AERO: 1.0000 "application " | INHALATION_SPRAY | CUTANEOUS | Status: DC | PRN

## 2016-08-22 MED ORDER — LIDOCAINE-PRILOCAINE 2.5-2.5 % EX CREA: 1.0000 "application " | TOPICAL_CREAM | CUTANEOUS | Status: DC | PRN

## 2016-08-22 NOTE — Care Management Note (Signed)
Inpatient Rehabilitation Center Individual Statement of Services  Patient Name:  [name] Small  Date:  08/22/2016  Welcome to the Inpatient Rehabilitation Center.  Our goal is to provide you with an individualized program based on your diagnosis and situation, designed to meet your specific needs.  With this comprehensive rehabilitation program, you will be expected to participate in at least 3 hours of rehabilitation therapies Monday-Friday, with modified therapy programming on the weekends.  Your rehabilitation program will include the following services:  Physical Therapy (PT), Occupational Therapy (OT), 24 hour per day rehabilitation nursing, Therapeutic Recreaction (TR), Neuropsychology, Case Management (Social Worker), Rehabilitation Medicine, Nutrition Services and Pharmacy Services  Weekly team conferences will be held on Wednesdays to discuss your progress.  Your Social Worker will talk with you frequently to get your input and to update you on team discussions.  Team conferences with you and your family in attendance may also be held.  Expected length of stay: 12-14 days  Overall anticipated outcome: modified independent  Depending on your progress and recovery, your program may change. Your Social Worker will coordinate services and will keep you informed of any changes. Your Social Worker's name and contact numbers are listed  below.  The following services may also be recommended but are not provided by the Inpatient Rehabilitation Center:   Driving Evaluations  Home Health Rehabiltiation Services  Outpatient Rehabilitation Services    Arrangements will be made to provide these services after discharge if needed.  Arrangements include referral to agencies that provide these services.  Your insurance has been verified to be:  Medicare, [place] and Medicaid Your primary doctor is:  Dr. Hyman Hopes  Pertinent information will be shared with your doctor and your insurance  company.  Social Worker:  [place], [place] [phone] or ([phone]   Information discussed with and copy given to patient by: Amada Jupiter, 08/22/2016, 11:24 AM

## 2016-08-22 NOTE — Progress Notes (Signed)
[place] PHYSICAL MEDICINE & REHABILITATION     PROGRESS NOTE    Subjective/Complaints: Pt seen standing at the sink, working with OT this AM.  She slept well overnight.  She states her pain is manageable.   ROS: Denies CP, SOB, N/V/D.  Objective: Vital Signs: Blood pressure 114/74, pulse (!) 47, temperature 98.4 F (36.9 C), temperature source Oral, resp. rate 18, height 5\' 3"  (1.6 m), weight 55.9 kg (123 lb 3.8 oz), SpO2 99 %. No results found.  Recent Labs  08/21/16 1511  WBC 7.7  HGB 8.6*  HCT 27.8*  PLT 308    Recent Labs  08/21/16 1511  NA 137  K 4.8  CL 100*  GLUCOSE 94  BUN 67*  CREATININE 7.24*  CALCIUM 9.5   CBG (last 3)  No results for input(s): GLUCAP in the last 72 hours.  Wt Readings from Last 3 Encounters:  08/21/16 55.9 kg (123 lb 3.8 oz)  08/18/16 50.8 kg (111 lb 15.9 oz)  08/04/16 51.2 kg (112 lb 14 oz)    Physical Exam:  Constitutional: She appears well-developed. Small stature. NAD. HENT: Normocephalic and atraumatic.  Eyes: EOMI.  No draingage  Cardiovascular: Normal rate, regular rhythm. No JVD. LUE fistula Respiratory: No respiratory distress. She has no wheezes. She has no rales. She exhibits no tenderness  GI: BS+. She exhibits no distension. There is no tenderness. There is no rebound.  Musculoskeletal: She exhibits trace LE edema. Mid and low back TTP. Neurological: She is alert and oriented  Strength 4/5 UE prox to distal.  B/l LE 4-/5 HF, KE, ankle dorsi/plantar flexion 4+/5 Sensation intact to light touch Skin: No rash noted. No erythema. Warm and dry.  Assessment/Plan: 1. Functional and mobility deficits  secondary to thoracic compression fractures which require 3+ hours per day of interdisciplinary therapy in a comprehensive inpatient rehab setting. Physiatrist is providing close team supervision and 24 hour management of active medical problems listed below. Physiatrist and rehab team continue to assess barriers to  discharge/monitor patient progress toward functional and medical goals.  Function:  Bathing Bathing position   Position: Wheelchair/chair at sink  Bathing parts Body parts bathed by patient: Right arm, Left arm, Chest, Abdomen, Right upper leg, Left upper leg, Front perineal area, Buttocks, Right lower leg, Left lower leg, Back Body parts bathed by helper: Right lower leg, Left lower leg  Bathing assist Assist Level: Touching or steadying assistance(Pt > 75%)      Upper Body Dressing/Undressing Upper body dressing   What is the patient wearing?: Pull over shirt/dress, Orthosis     Pull over shirt/dress - Perfomed by patient: Thread/unthread right sleeve, Thread/unthread left sleeve, Put head through opening, Pull shirt over trunk       Orthosis activity level: Performed by patient  Upper body assist Assist Level: Set up      Lower Body Dressing/Undressing Lower body dressing   What is the patient wearing?: Underwear, Pants, Non-skid slipper socks Underwear - Performed by patient: Thread/unthread right underwear leg, Thread/unthread left underwear leg, Pull underwear up/down   Pants- Performed by patient: Thread/unthread right pants leg, Thread/unthread left pants leg, Pull pants up/down   Non-skid slipper socks- Performed by patient: Don/doff right sock, Don/doff left sock   Socks - Performed by patient: Don/doff right sock, Don/doff left sock                Lower body assist Assist for lower body dressing: Touching or steadying assistance (Pt > 75%)  Toileting Toileting Toileting activity did not occur: No continent bowel/bladder event Toileting steps completed by patient: Adjust clothing prior to toileting, Performs perineal hygiene, Adjust clothing after toileting      Toileting assist Assist level: Touching or steadying assistance (Pt.75%)   Transfers Chair/bed transfer   Chair/bed transfer method: Stand pivot Chair/bed transfer assist level: Moderate  assist (Pt 50 - 74%/lift or lower) Chair/bed transfer assistive device: Walker, Orthosis     Locomotion Ambulation     Max distance: 15 Assist level: Touching or steadying assistance (Pt > 75%)   Wheelchair   Type: Manual Max wheelchair distance: 75 Assist Level: Touching or steadying assistance (Pt > 75%)  Cognition Comprehension Comprehension assist level: Follows complex conversation/direction with no assist  Expression Expression assist level: Expresses complex ideas: With no assist  Social Interaction Social Interaction assist level: Interacts appropriately with others with medication or extra time (anti-anxiety, antidepressant). (zoloft daily)  Problem Solving Problem solving assist level: Solves complex problems: Recognizes & self-corrects  Memory Memory assist level: Complete Independence: No helper   Medical Problem List and Plan: 1.  Weakness and functional deficits due to thoracic compression fractures, T7-T8 secondary to recent fall.  Cont CIR  -Back brace when out of bed             -continue therapies 2.  DVT Prophylaxis/Anticoagulation:   Vascular study showing chronic DVT  Will request Heme/Onc eval for further recs 3. Pain Management with a history of narcotic abuse:   Naprosyn 500 mg BID  Oxycodone PRN, will increase as necessary  Robaxin as needed  Dilaudid for more severe pain, d/ced on 10/30  Will d/c fentanyl 11/1 4. Mood: Zoloft 100 mg daily 5. Neuropsych: This patient is capable of making decisions on her own behalf. 6. Skin/Wound Care: Routine skin checks 7. 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 Acute on chronic anemia.   Hb 8.6 on 10/30  Hemocult stools pending  Cont to monitor 11. Medical noncompliance. Counseling   LOS (Days) 4 A FACE TO FACE EVALUATION WAS PERFORMED  Ankit Karis Jubanil  Patel 08/22/2016 8:27 AM

## 2016-08-22 NOTE — Progress Notes (Signed)
Social Work  Social Work Assessment and Plan  Patient Details  Name: [name] Small MRN: [id number] Date of Birth: [birth year]  Today's Date: 08/22/2016  Problem List:  Patient Active Problem List   Diagnosis Date Noted  . Acute blood loss anemia   . 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 Anemia of chronic disease   . H/O medication noncompliance   . 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 Acute on chronic systolic CHF (congestive heart failure) (HCC) 08/16/2016  . Noncompliance of patient with renal dialysis (HCC) 08/12/2016  . 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 Osteoporosis 08/01/2016  . Thoracic compression fracture - T7, T8, T12 07/27/2016  . Acute pulmonary edema (HCC) 07/26/2016  . Essential hypertension 06/17/2016  . Depression with anxiety 06/17/2016  . Volume overload 04/14/2016  . Peripheral edema 04/14/2016  . NICM (nonischemic cardiomyopathy) (HCC) 04/14/2016  . Internal jugular (IJ) vein thromboembolism, acute (HCC) 04/14/2016  . Pericardial effusion 04/14/2016  . Chronic anticoagulation   . Hypertensive urgency 04/11/2016  . DVT (deep venous thrombosis), left 04/11/2016  . Cellulitis of left upper extremity 03/31/2016  . Chronic pain syndrome 02/29/2016  . Chronic combined systolic and diastolic congestive heart failure (HCC) 01/04/2016  . AICD (automatic cardioverter/defibrillator) present 01/04/2016  . 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 Pathologic pelvic fracture 01/04/2016  . Depression 01/04/2016  . Pain in the chest   . ESRD on dialysis (HCC)   . Tachycardia   . Elevated serum hCG   . Chest pain 10/11/2015  . Fluid overload 10/10/2015  . Hypertension 10/10/2015  . Anemia of chronic renal failure, stage 5 (HCC) 10/04/2015  . Thrombocytopenia (HCC) 10/04/2015  . Chest pain of uncertain etiology 10/04/2015  . Chronically Elevated troponin 03/03/2015  . Hyperkalemia 02/21/2015  .  Acute on chronic systolic heart failure (HCC) 02/21/2012  . Pulmonary infarct (HCC) 02/07/2012  . ESRD (end stage renal disease) on dialysis (HCC) 02/03/2012  . PE (pulmonary embolism) 02/03/2012   Past Medical History:  Past Medical History:  Diagnosis Date  . AICD (automatic cardioverter/defibrillator) present 12/16/14   [employer]  . Anemia   . Anxiety   . Cardiomyopathy   . Cellulitis and abscess of face 03/22/2013  . CHF (congestive heart failure) (HCC)   . Chronic anticoagulation   . Chronic pain   . Depression   . DVT (deep venous thrombosis) (HCC) ~ 2014   BLE  . Dysrhythmia    at times per pt.  . 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 ESRD (end stage renal disease) on dialysis [facility](HCC)    "MWF; [facility]" (08/15/2016)  . Fracture, thoracic vertebra (HCC) 07/2016   "T7-T12"  . GERD (gastroesophageal reflux disease)   . H/O transfusion of packed red blood cells   . Heart murmur   . Hemodialysis patient (HCC)    Mon. Wed. Fri  . Hyperkalemia 09/2015  . Hypertension   . Narcotic abuse, continuous   . Osteoporosis   . Pelvic fracture (HCC) 06/2015   "on the right"  . Polycystic kidney disease   . Pulmonary emboli (HCC) 01/2012   Bilateral, moderate clot burden, areas of pulmonary infarction and central necrosis  . Renal insufficiency   . Sickle cell anemia (HCC)    Past Surgical History:  Past Surgical History:  Procedure Laterality Date  . AV  FISTULA PLACEMENT Bilateral    "right side stopped working & never had it revised" (04/11/2016)  . AV FISTULA PLACEMENT Right 08/01/2016   Procedure: INSERTION OF ARTERIOVENOUS (AV) GORE-TEX GRAFT RIGHT ARM;  Surgeon: Chuck Hint, MD;  Location: [facility] OR;  Service: Vascular;  Laterality: Right;  . AVGG REMOVAL Right 08/03/2016   Procedure: REMOVAL OF ARTERIOVENOUS GORETEX GRAFT (AVGG);  Surgeon:  Maeola Harman, MD;  Location: [facility] OR;  Service: Vascular;  Laterality: Right;  . CARDIAC CATHETERIZATION    . IMPLANTABLE CARDIOVERTER DEFIBRILLATOR IMPLANT Right 12/2014  . INCISION AND DRAINAGE ABSCESS Right 03/21/2013   Procedure: INCISION AND DRAINAGE RIGHT CHEEK ABSCESS REMOVAL OF FOREIGN BODY;  Surgeon: Serena Colonel, MD;  Location: [facility] OR;  Service: ENT;  Laterality: Right;  . INSERTION OF DIALYSIS CATHETER Right Mar 09, 2016   thigh, Dr. Wyn Quaker, [facility]  . 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 NEPHRECTOMY Right     kidney placed in 2008, and body rejected in 2012   . PERIPHERAL VASCULAR CATHETERIZATION Left 03/09/2016   Procedure: A/V Shuntogram/Fistulagram;  Surgeon: Annice Needy, MD;  Location: [facility] INVASIVE CV LAB;  Service: Cardiovascular;  Laterality: Left;  . PERIPHERAL VASCULAR CATHETERIZATION N/A 05/09/2016   Procedure: Dialysis/Perma Catheter Removal;  Surgeon: Renford Dills, MD;  Location: [facility] INVASIVE CV LAB;  Service: Cardiovascular;  Laterality: N/A;  . PERIPHERAL VASCULAR CATHETERIZATION N/A 07/12/2016   Procedure: Dialysis/Perma Catheter Insertion;  Surgeon: Annice Needy, MD;  Location: [facility] INVASIVE CV LAB;  Service: Cardiovascular;  Laterality: N/A;  . PERIPHERAL VASCULAR CATHETERIZATION Right 07/31/2016   Procedure: Upper Extremity Venography;  Surgeon: Maeola Harman, MD;  Location: [facility] INVASIVE CV LAB;  Service: Cardiovascular;  Laterality: Right;  . REVISON OF ARTERIOVENOUS FISTULA Left 03/22/2016   Procedure: REVISON OF ARTERIOVENOUS FISTULA ( ARTEGRAFT );  Surgeon: Annice Needy, MD;  Location: [facility] ORS;  Service: Vascular;  Laterality: Left;  . TONSILLECTOMY AND ADENOIDECTOMY  ~ 2000   Social History:  reports that she quit smoking about 8 months ago. Her smoking use included Cigarettes. She smoked 0.00 packs per day for 1.00 year. She has never used smokeless tobacco. She reports that she does not drink alcohol or use drugs.  Family / Support  Systems Marital Status: Single (has a boyfriend of 4 yrs) Patient Roles: Parent, Partner Spouse/Significant Other: boyfriend, [name] Small - a truck driver and away for several days at a time. Children: None Other Supports: mother, [name] Small @ [phone];  father, [name] Small @ [phone] Anticipated Caregiver: Mom and Boyfriend intermittently Ability/Limitations of Caregiver: Mom works days close to [employer] apartment; can she in intermittently throughout the workday on pt; Boyfriend delivers out of town for work and may be gone a week at a time Medical laboratory scientific officer: Intermittent Family Dynamics: Pt initially offers little information about her relationship with parents.  She does have two younger siblings.  She reports that she moved from [place] to [place] ~ 6 mos ago "thinking that my family would help more...that wasn't the case really..."  Admits frustration with little support parents provide and notes that she does not feel she can rely on them for more support now.  Social History Preferred language: English Religion: Christian Cultural Background: NA Education: HS Read: Yes Write: Yes Employment Status: Disabled Fish farm manager Issues: None Guardian/Conservator: None - per MD, pt is capable of making decisions on her own behalf.   Abuse/Neglect Physical Abuse: Denies Verbal Abuse: Denies Sexual  Abuse: Denies Exploitation of patient/patient's resources: Denies Self-Neglect: Denies  Emotional Status Pt's affect, behavior adn adjustment status: Pt very flat and soft-spoken throughout interview.  She appears reluctant to speak too much about her family support.  She does admit some concern about managing at home with only intermittent support as she was struggling after recent hospital d/c and not able to get to HD consistently.  She does report chronic depression and on medications but denies any significant emotional distress at this time.  Will refer  for neuropsychology for support as her current presentation and living situation appears concerning Recent Psychosocial Issues: Move to [place]  ~ 6 months ago with hopes of having more support from parents "... and now I wonder why I did it at all...".   Pyschiatric History: Pt reports she received "some counseling" as a late teen but none in several years.  Her primary MD manages her depression meds. Substance Abuse History: None  Patient / Family Perceptions, Expectations & Goals Pt/Family understanding of illness & functional limitations: Pt with basic understanding of her thoracic fxs and current debility/ need for CIR.  Still to follow up with parents if pt agreeable. Premorbid pt/family roles/activities: Pt admits that, after last hospital d/c (10/13) she spent most of her time in bed and did miss some HD treatments due to no support in getting there.  Prior to her fall in early Oct, she did not need as much assist and was using a rw for mobility. Anticipated changes in roles/activities/participation: Little change overall anticipated if pt able to reach mod independent goals. Pt/family expectations/goals: "I just hope I can get a little stronger."  [employer]: Other (Comment) (HD at center in [place] PTA;  to change to [facility]) Premorbid Home Care/DME Agencies: Other (Comment) (pt notes after last d/c, HH was planned but never got started.  She is unsure which agency) Transportation available at discharge: Uses Medicaid transport/ public transport and [place]. Resource referrals recommended: Neuropsychology  Discharge Planning Living Arrangements: Alone (BF there at times) Support Systems: Spouse/significant other, Parent Type of Residence: Private residence (Elderly/ Disabled highrise apt.  Handicap accessible) Insurance Resources: [employer], [employer] (specify county), Media planner (specify) Counselling psychologist) Financial Resources: SSI, SSD Financial Screen Referred:  No Living Expenses: Rent Money Management: Patient Does the patient have any problems obtaining your medications?: No Home Management: pt Patient/Family Preliminary Plans: Pt plans to return to her apt with intermittent assist from bf and possibly from mother. Social Work Anticipated Follow Up Needs: HH/OP Expected length of stay: 12-14 days  Clinical Impression Very unfortunate young woman with multiple, chronic health issues and now with new thoracic fxs and decreased mobility.  Affect flat and very soft-spoken. Offers only brief answers and difficult to engage.  She does share her frustrations about what she feels is limited support from her parents who live locally (and the reason she had moved to [place] from [place]).  Has struggled at home for the past month since having a fall and actually missed several HD txs.  Team has targeted mod independent goals and pt hopeful she can reach these due to her limited support.  Feel she will benefit from referral for neuropsychology consult.  Will follow for support and d/c planning needs.  [name] Small 08/22/2016, 4:47 PM

## 2016-08-22 NOTE — Progress Notes (Signed)
Occupational Therapy Session Note  Patient Details  Name: [name] Small MRN: [id number] Date of Birth: 08-25-1991  Today's Date: 08/22/2016 OT Individual Time: 6568-1275 and 1300-1357 OT Individual Time Calculation (min): 59 min and 57 min     Short Term Goals: Week 1:  OT Short Term Goal 1 (Week 1): Pt will complete LB bathing sit to stand with min assist using AE PRN. OT Short Term Goal 2 (Week 1): Pt will complete LB dressing with min assist sit to stand.  OT Short Term Goal 3 (Week 1): Pt will perform toilet transfers with min assist using the RW and 3:1. OT Short Term Goal 4 (Week 1): Pt will perform RUE AAROM exercises with modified independence in order to increase AROM for grooming and selfcare tasks.   Skilled Therapeutic Interventions/Progress Updates:     Session 1: Upon entering the room, pt seated on EOB and declined bathing and dressing this session but agreeable to OT intervention. Pt ambulated with RW and min A to sink to perform grooming while standing. Pt ambulated 40' towards ADL apartment with RW and min A. OT and pt discussed home set up and problem solving best way to perform home transfers based on this set up. Pt returned demonstrations for standard bed transfer and stand pivot to low,plush sofa. Pt reports fatigue and increased pain at this time and upon return RN arrives to give pain medication. Pt remained in wheelchair with call bell and all needed items within reach upon exiting the room.   Session 2: Upon entering the room, pt seated on EOB awaiting therapist arrival. Pt with 7/10 c/o pain this session with medication given by RN. Pt propelled wheelchair 150' with B UE's to dayroom with 3 rest breaks needed secondary to fatigue. Pt engaged in horseshoe activity with min A for sit <>stand from wheelchair. Pt needing min cues to maintain back precautions and bend knees when tossing and horse shoes and utilizing long handled reach to pick up from floor. Pt performed  this task x2 with rest break needed between sets secondary to fatigue. Pt propelled wheelchair back to room at end of session in same manner. Stand pivot transfer with min A to sit on EOB. Pt requested to remain on EOB with lunch placed in front of her. Call bell and all needed items within reach upon exiting the room.   Therapy Documentation Precautions:  Precautions Precautions: Fall, Back Precaution Booklet Issued: No Precaution Comments: Patient able to state precautions. Required Braces or Orthoses: Spinal Brace Spinal Brace: Thoracolumbosacral orthotic Restrictions Weight Bearing Restrictions: No Vital Signs: Therapy Vitals Temp: 98.4 F (36.9 C) Temp Source: Oral Pulse Rate: (!) 47 Resp: 18 BP: 114/74 Patient Position (if appropriate): Lying Oxygen Therapy SpO2: 99 % O2 Device: Not Delivered Pain: Pain Assessment Pain Score: 7   See Function Navigator for Current Functional Status.   Therapy/Group: Individual Therapy  Lowella Grip 08/22/2016, 9:57 AM

## 2016-08-22 NOTE — Consult Note (Signed)
Reason for Referral: Chronic the vein thrombosis.   HPI: 25 year old woman with few comorbid conditions including end-stage renal disease related to polycystic kidney disease. She had multiple hospitalizations dating back to 2013 with extensive medical history. She was diagnosed with pulmonary embolism in April 2013 after she presented with hemoptysis and subtherapeutic INR at that time. IVC filter was placed and was anticoagulated periodically since that time. She did have a deep vein thrombosis as well in her right lower extremity. She had been on Eliquis per her report for a right upper extremity deep vein thrombosis previously. She did have a hypercoagulable workup done in 2013 that was unremarkable.   He is currently receiving acute rehabilitation after recent hospitalization from October 21 of October 27 because of a pulmonary edema after missed dialysis treatments. She also developed compression fractures at T7, T8 and T12 and currently wearing a brace related to that. She did have a repeat lower extremity Dopplers which showed chronic deep vein thrombosis in the right lower extremity without any evidence of acute thrombosis.  Clinically she feels reasonably well and improving slowly. Her mobility has also improved and denied any lower extremity swelling, chest pain or shortness of breath. He denied any headaches, blurry vision, syncope or seizures. She does not report any palpitation, orthopnea or leg edema. She does not report any cough, wheezing or hemoptysis. He does not report a nausea, vomiting or abdominal pain. She does not report any urinary symptoms. Remaining review of systems unremarkable.   Past Medical History:  Diagnosis Date  . AICD (automatic cardioverter/defibrillator) present 12/16/14   [employer]  . Anemia   . Anxiety   . Cardiomyopathy   . Cellulitis and abscess of face 03/22/2013  . CHF (congestive heart failure) (HCC)   . Chronic anticoagulation   . Chronic  pain   . Depression   . DVT (deep venous thrombosis) (HCC) ~ 2014   BLE  . Dysrhythmia    at times per pt.  . 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 ESRD (end stage renal disease) on dialysis [facility](HCC)    "MWF; [facility]" (08/15/2016)  . Fracture, thoracic vertebra (HCC) 07/2016   "T7-T12"  . GERD (gastroesophageal reflux disease)   . H/O transfusion of packed red blood cells   . Heart murmur   . Hemodialysis patient (HCC)    Mon. Wed. Fri  . Hyperkalemia 09/2015  . Hypertension   . Narcotic abuse, continuous   . Osteoporosis   . Pelvic fracture (HCC) 06/2015   "on the right"  . Polycystic kidney disease   . Pulmonary emboli (HCC) 01/2012   Bilateral, moderate clot burden, areas of pulmonary infarction and central necrosis  . Renal insufficiency   . Sickle cell anemia (HCC)   :  Past Surgical History:  Procedure Laterality Date  . AV FISTULA PLACEMENT Bilateral    "right side stopped working & never had it revised" (04/11/2016)  . AV FISTULA PLACEMENT Right 08/01/2016   Procedure: INSERTION OF ARTERIOVENOUS (AV) GORE-TEX GRAFT RIGHT ARM;  Surgeon: Chuck Hinthristopher S Dickson, MD;  [facility];  Service: Vascular;  Laterality: Right;  . AVGG REMOVAL Right 08/03/2016   Procedure: REMOVAL OF ARTERIOVENOUS GORETEX GRAFT (AVGG);  Surgeon: Maeola HarmanBrandon Christopher Cain, MD;  [facility];  Service: Vascular;  Laterality: Right;  . CARDIAC CATHETERIZATION    . IMPLANTABLE CARDIOVERTER DEFIBRILLATOR IMPLANT Right 12/2014  .  INCISION AND DRAINAGE ABSCESS Right 03/21/2013   Procedure: INCISION AND DRAINAGE RIGHT CHEEK ABSCESS REMOVAL OF FOREIGN BODY;  Surgeon: Serena Colonel, MD;  Location: [facility] OR;  Service: ENT;  Laterality: Right;  . INSERTION OF DIALYSIS CATHETER Right Mar 09, 2016   thigh, Dr. Wyn Quaker, [facility]  . 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 NEPHRECTOMY Right     kidney  placed in 2008, and body rejected in 2012   . PERIPHERAL VASCULAR CATHETERIZATION Left 03/09/2016   Procedure: A/V Shuntogram/Fistulagram;  Surgeon: Annice Needy, MD;  Location: [facility] INVASIVE CV LAB;  Service: Cardiovascular;  Laterality: Left;  . PERIPHERAL VASCULAR CATHETERIZATION N/A 05/09/2016   Procedure: Dialysis/Perma Catheter Removal;  Surgeon: Renford Dills, MD;  Location: [facility] INVASIVE CV LAB;  Service: Cardiovascular;  Laterality: N/A;  . PERIPHERAL VASCULAR CATHETERIZATION N/A 07/12/2016   Procedure: Dialysis/Perma Catheter Insertion;  Surgeon: Annice Needy, MD;  Location: [facility] INVASIVE CV LAB;  Service: Cardiovascular;  Laterality: N/A;  . PERIPHERAL VASCULAR CATHETERIZATION Right 07/31/2016   Procedure: Upper Extremity Venography;  Surgeon: Maeola Harman, MD;  [facility] INVASIVE CV LAB;  Service: Cardiovascular;  Laterality: Right;  . REVISON OF ARTERIOVENOUS FISTULA Left 03/22/2016   Procedure: REVISON OF ARTERIOVENOUS FISTULA ( ARTEGRAFT );  Surgeon: Annice Needy, MD;  Location: [facility] ORS;  Service: Vascular;  Laterality: Left;  . TONSILLECTOMY AND ADENOIDECTOMY  ~ 2000  :   Current Facility-Administered Medications:  .  0.9 %  sodium chloride infusion, 100 mL, Intravenous, PRN, Beryle Lathe, MD .  0.9 %  sodium chloride infusion, 100 mL, Intravenous, PRN, Beryle Lathe, MD .  alteplase (CATHFLO ACTIVASE) injection 2 mg, 2 mg, Intracatheter, Once PRN, Beryle Lathe, MD .  calcitonin (salmon) (MIACALCIN/FORTICAL) nasal spray 1 spray, 1 spray, Alternating Nares, Daily, Mcarthur Rossetti Angiulli, PA-C, 1 spray at 08/22/16 0849 .  calcitRIOL (ROCALTROL) capsule 1 mcg, 1 mcg, Oral, Q M,W,F-HD, Mcarthur Rossetti Angiulli, PA-C, 1 mcg at 08/21/16 1207 .  cinacalcet (SENSIPAR) tablet 90 mg, 90 mg, Oral, Q supper, Mcarthur Rossetti Angiulli, PA-C, 90 mg at 08/20/16 1653 .  [START ON 08/23/2016] Darbepoetin Alfa (ARANESP) injection 200 mcg, 200 mcg, Intravenous, Q Wed-HD, Beryle Lathe, MD .   famotidine (PEPCID) tablet 20 mg, 20 mg, Oral, BID PRN, Mcarthur Rossetti Angiulli, PA-C .  feeding supplement (NEPRO CARB STEADY) liquid 237 mL, 237 mL, Oral, BID BM, Weston Settle, PA-C, 237 mL at 08/22/16 1500 .  fentaNYL (DURAGESIC - dosed mcg/hr) 12.5 mcg, 12.5 mcg, Transdermal, Q72H, Ranelle Oyster, MD, 12.5 mcg at 08/20/16 0853 .  ferric gluconate (NULECIT) 250 mg in sodium chloride 0.9 % 100 mL IVPB, 250 mg, Intravenous, Q M,W,F-HD, Weston Settle, PA-C, 250 mg at 08/21/16 1700 .  heparin injection 1,000 Units, 1,000 Units, Dialysis, PRN, Mcarthur Rossetti Angiulli, PA-C .  heparin injection 1,000 Units, 1,000 Units, Dialysis, PRN, Beryle Lathe, MD .  heparin injection 2,200 Units, 40 Units/kg, Dialysis, Once in dialysis, Beryle Lathe, MD .  lidocaine (PF) (XYLOCAINE) 1 % injection 5 mL, 5 mL, Intradermal, PRN, Beryle Lathe, MD .  lidocaine-prilocaine (EMLA) cream 1 application, 1 application, Topical, PRN, Beryle Lathe, MD .  methocarbamol (ROBAXIN) tablet 500 mg, 500 mg, Oral, Q6H PRN, Mcarthur Rossetti Angiulli, PA-C, 500 mg at 08/22/16 1504 .  multivitamin (RENA-VIT) tablet 1 tablet, 1 tablet, Oral, QHS, Mcarthur Rossetti Angiulli, PA-C, 1 tablet at 08/21/16 2034 .  naproxen (NAPROSYN) tablet 500 mg, 500 mg, Oral, BID WC, Mcarthur Rossetti Angiulli,  PA-C, 500 mg at 08/22/16 0849 .  oxyCODONE (Oxy IR/ROXICODONE) immediate release tablet 15 mg, 15 mg, Oral, Q6H PRN, Mcarthur Rossetti Angiulli, PA-C, 15 mg at 08/22/16 1504 .  pentafluoroprop-tetrafluoroeth (GEBAUERS) aerosol 1 application, 1 application, Topical, PRN, Beryle Lathe, MD .  sertraline (ZOLOFT) tablet 100 mg, 100 mg, Oral, Daily, Mcarthur Rossetti Angiulli, PA-C, 100 mg at 08/22/16 0848 .  sevelamer carbonate (RENVELA) tablet 2,400 mg, 2,400 mg, Oral, TID WC, Daniel J Angiulli, PA-C, 2,400 mg at 08/22/16 1219 .  sorbitol 70 % solution 30 mL, 30 mL, Oral, Daily PRN, Mcarthur Rossetti Angiulli, PA-C:  Allergies  Allergen Reactions  . Aspirin Other (See Comments)    Interacts  with Coreg  . Morphine And Related Itching and Other (See Comments)    Ok with oxycodone   . Tramadol Anaphylaxis  . Vicodin [Hydrocodone-Acetaminophen] Hives  . Buprenorphine Hcl Itching and Other (See Comments)    Ok with oxycodone  . Iohexol Itching  :  Family History  Problem Relation Age of Onset  . Polycystic kidney disease Father   . Hypertension Father   :  Social History   Social History  . Marital status: Single    Spouse name: N/A  . Number of children: N/A  . Years of education: N/A   Occupational History  . Not on file.   Social History Main Topics  . Smoking status: Former Smoker    Packs/day: 0.00    Years: 1.00    Types: Cigarettes    Quit date: 12/08/2015  . Smokeless tobacco: Never Used  . Alcohol use No  . Drug use: No  . Sexual activity: Not Currently    Birth control/ protection: None   Other Topics Concern  . Not on file   Social History Narrative   Was smoking 3 cigs per day. Lives at home with roommates, has some family locally.   :  Pertinent items are noted in HPI.  Exam: Blood pressure 129/76, pulse 100, temperature 98.5 F (36.9 C), temperature source Oral, resp. rate 16, height 5\' 3"  (1.6 m), weight 123 lb 3.8 oz (55.9 kg), SpO2 98 %.  General appearance: alert and cooperative without distress. Head: Normocephalic, without obvious abnormality Throat: lips, mucosa, and tongue normal; teeth and gums normal Neck: no adenopathy Back: negative Resp: clear to auscultation bilaterally Cardio: regular rate and rhythm, S1, S2 normal, no murmur, click, rub or gallop Extremities: extremities normal, atraumatic, no cyanosis or edema   Recent Labs  08/21/16 1511  WBC 7.7  HGB 8.6*  HCT 27.8*  PLT 308    Recent Labs  08/21/16 1511  NA 137  K 4.8  CL 100*  CO2 24  GLUCOSE 94  BUN 67*  CREATININE 7.24*  CALCIUM 9.5       Ct Thoracic Spine Wo Contrast  Result Date: 07/27/2016 CLINICAL DATA:  25 year old female with  progressive upper back pain and shortness of breath after falling. EXAM: CT THORACIC SPINE WITHOUT CONTRAST TECHNIQUE: Multidetector CT imaging of the thoracic spine was performed without intravenous contrast administration. Multiplanar CT image reconstructions were also generated. COMPARISON:  Prior chest CT 06/17/2016. Prior CT scan of the abdomen and pelvis 10/26/2015 FINDINGS: Alignment: Slightly exaggerated thoracic kyphosis. No scoliotic curvature. Vertebrae: Acute nondisplaced fracture through the posterior aspect of the right fifth rib. Subtle compression fracture of the anterior aspect of T7 with buckling of the cortex. There is approximately 18- 19% height loss anteriorly. No posterior retropulsion. Findings are new compared to prior  chest CT 06/17/2016. Very subtle probable compression deformity of the superior endplate of T8 without height loss. Additionally, there is subtle buckling of the anterior cortex at T12 which is less specific but new compared to October 26, 2015. No associated height loss. Diffuse demineralization of the bones suggests osteoporosis. Paraspinal and other soft tissues: No paraspinal mass or hematoma. Disc levels: Moderately large right pleural effusion. Incompletely imaged right subclavian approach intracardiac defibrillator. Cardiomegaly. Moderate pericardial effusion. Incompletely imaged IVC filter. Numerous bilateral renal cysts and calcifications of varying complexity consistent with the clinical history of polycystic kidney disease. IMPRESSION: 1. Acute to subacute fracture of T7 with 18-19% anterior height loss. No evidence of posterior retropulsion. 2. Suspect subtle compression deformity of the superior endplate of T8 without associated height loss. 3. Similarly, probable acute to subacute fracture involving the anterior cortex of T12 without evidence of associated height loss. This finding is new compared to October 26, 2015 and has likely occurred at some point since that  time. 4. Diffusely abnormal bone and marrow density with areas of relatively increased sclerosis and osteopenia. Findings may represent aggressive osteopenia, or renal osteodystrophy. 5. Moderately large right pleural effusion without significant interval change compared 06/17/2016. 6. Nondisplaced fracture the posterior aspect of the right fifth rib. 7. Cardiomegaly. 8. Moderate pericardial effusion. 9. Numerous renal cysts of varying complexity bilaterally. Electronically Signed   By: Malachy Moan M.D.   On: 07/27/2016 10:22    Assessment and Plan:   25 year old woman with the following issues:  1. Chronic deep vein thrombosis with a previous history of pulmonary embolism. These findings noted at least 2013 without any acute thrombosis episodes. She had Dopplers of her lower extremities which did not show acute deep vein thrombosis. She had multiple imaging studies of the chest and did not show any residual pulmonary embolism.  Her hypercoagulable workup has been performed in the past and did not show any inherited thrombophilia.  Based on these findings, I do not recommend any long-term anticoagulation at this time given the lack of acute deep vein thrombosis. I do recommend full dose anticoagulation if she develops acute deep vein thrombosis or pulmonary embolism. I see no evidence of that at this time.  2. Anemia: Her anemia is multifactorial in nature related due to anemia of renal disease as well as iron deficiency. She had a hemoglobin electrophoresis in March 2017 and showed no evidence of hemoglobinopathy with sickle cell disease. Her iron studies suggest iron deficiency and I recommend supplementation. Usually intravenous iron can be given with hemodialysis and I will leave this to the renal service to address with her dialysis.  Please call with any questions regarding this pleasant woman.

## 2016-08-22 NOTE — Progress Notes (Signed)
Physical Therapy Session Note  Patient Details  Name: [name] Small MRN: [id number] Date of Birth: [birth year]  Today's Date: 08/22/2016 PT Individual Time: 0940-1055 PT Individual Time Calculation (min): 75 min    Short Term Goals: Week 1:  PT Short Term Goal 1 (Week 1): Patient will be able to complete supine to sit transfer with supervision PT Short Term Goal 2 (Week 1): Patient will be able to perform bed to w/c transfer with Supervision PT Short Term Goal 3 (Week 1): Patient will be able to  cover the distance of 75 feet with RW with Supervision PT Short Term Goal 4 (Week 1): Patient will be able to perform car transfer with Supervision to regular height car and min A for SUV PT Short Term Goal 5 (Week 1): Patient will be able to perform standing activities for 5 min w/o rest break and w/o LOB, no UE support  Skilled Therapeutic Interventions/Progress Updates:  Pt received sitting EOB with TLSO donned; pt reports pain is manageable.  Pt performed stand pivot bed <> w/c with RW and min A with decreased LE unsteadiness.  Performed transfer w/c > mat with min A and RW.  Pt removed TLSO and performed sit > supine with min A due to pain in pelvis and back.  Pt instructed in Meeks Method "Re-Alignment Routine" for spinal decompression and strengthening excluding the leg lengthening exercise due to contraindications s/p compression fracture; see handout in room.  Pt began with pain at level 7/10 and after exercises pt's pain decreased to 5/10.  Returned to sitting with log rolling and min A.  Donned TLSO and performed stair negotiation training on smaller stairs with pt performing up/down 8 stairs with step to sequence and min A with verbal cues for sequence.  Returned to w/c and pt transported back to the room; transferred w/c > sit EOB stand pivot with RW and min A.  Pt set up with all items within reach.  Therapy Documentation Precautions:  Precautions Precautions: Fall, Back Precaution  Booklet Issued: No Precaution Comments: Patient able to state precautions. Required Braces or Orthoses: Spinal Brace Spinal Brace: Thoracolumbosacral orthotic Restrictions Weight Bearing Restrictions: No Pain: Pain Assessment Pain Assessment: 0-10 Pain Score: 4    See Function Navigator for Current Functional Status.   Therapy/Group: Individual Therapy  Edman Circle Faucette 08/22/2016, 12:30 PM

## 2016-08-23 ENCOUNTER — Inpatient Hospital Stay (HOSPITAL_COMMUNITY): Payer: Medicare Other | Admitting: Physical Therapy

## 2016-08-23 ENCOUNTER — Inpatient Hospital Stay (HOSPITAL_COMMUNITY): Payer: Medicare Other | Admitting: Occupational Therapy

## 2016-08-23 ENCOUNTER — Encounter: Payer: Self-pay | Admitting: Vascular Surgery

## 2016-08-23 ENCOUNTER — Encounter: Payer: Self-pay | Admitting: Family

## 2016-08-23 LAB — CBC
HCT: 27.3 % — ABNORMAL LOW (ref 36.0–46.0)
Hemoglobin: 8.3 g/dL — ABNORMAL LOW (ref 12.0–15.0)
MCH: 26.9 pg (ref 26.0–34.0)
MCHC: 30.4 g/dL (ref 30.0–36.0)
MCV: 88.3 fL (ref 78.0–100.0)
PLATELETS: 299 10*3/uL (ref 150–400)
RBC: 3.09 MIL/uL — AB (ref 3.87–5.11)
RDW: 19.7 % — AB (ref 11.5–15.5)
WBC: 7.6 10*3/uL (ref 4.0–10.5)

## 2016-08-23 LAB — RENAL FUNCTION PANEL
ALBUMIN: 3 g/dL — AB (ref 3.5–5.0)
ANION GAP: 15 (ref 5–15)
BUN: 51 mg/dL — AB (ref 6–20)
CHLORIDE: 99 mmol/L — AB (ref 101–111)
CO2: 23 mmol/L (ref 22–32)
Calcium: 7.8 mg/dL — ABNORMAL LOW (ref 8.9–10.3)
Creatinine, Ser: 6.24 mg/dL — ABNORMAL HIGH (ref 0.44–1.00)
GFR, EST AFRICAN AMERICAN: 10 mL/min — AB (ref >60)
GFR, EST NON AFRICAN AMERICAN: 8 mL/min — AB (ref >60)
Glucose, Bld: 82 mg/dL (ref 65–99)
PHOSPHORUS: 3.9 mg/dL (ref 2.5–4.6)
POTASSIUM: 4.5 mmol/L (ref 3.5–5.1)
Sodium: 137 mmol/L (ref 135–145)

## 2016-08-23 MED ORDER — DIPHENHYDRAMINE HCL 50 MG/ML IJ SOLN
25.0000 mg | INTRAMUSCULAR | Status: DC
  Administered 2016-08-23 – 2016-09-02 (×6): 25 mg via INTRAVENOUS
  Filled 2016-08-23: qty 1

## 2016-08-23 MED ORDER — DARBEPOETIN ALFA 200 MCG/0.4ML IJ SOSY
PREFILLED_SYRINGE | INTRAMUSCULAR | Status: AC
  Administered 2016-08-23: 200 ug
  Filled 2016-08-23: qty 0.4

## 2016-08-23 MED ORDER — HEPARIN SODIUM (PORCINE) 1000 UNIT/ML DIALYSIS: 1000.0000 [IU] | INTRAMUSCULAR | Status: DC | PRN

## 2016-08-23 MED ORDER — ALTEPLASE 2 MG IJ SOLR: 2.0000 mg | Freq: Once | INTRAMUSCULAR | Status: DC | PRN

## 2016-08-23 MED ORDER — HEPARIN SODIUM (PORCINE) 1000 UNIT/ML DIALYSIS: 2000.0000 [IU] | Freq: Once | INTRAMUSCULAR | Status: DC

## 2016-08-23 MED ORDER — PENTAFLUOROPROP-TETRAFLUOROETH EX AERO: 1.0000 "application " | INHALATION_SPRAY | CUTANEOUS | Status: DC | PRN

## 2016-08-23 MED ORDER — SODIUM CHLORIDE 0.9 % IV SOLN: 100.0000 mL | INTRAVENOUS | Status: DC | PRN

## 2016-08-23 MED ORDER — LIDOCAINE HCL (PF) 1 % IJ SOLN: 5.0000 mL | INTRAMUSCULAR | Status: DC | PRN

## 2016-08-23 MED ORDER — LIDOCAINE-PRILOCAINE 2.5-2.5 % EX CREA: 1.0000 "application " | TOPICAL_CREAM | CUTANEOUS | Status: DC | PRN

## 2016-08-23 MED ORDER — DIPHENHYDRAMINE HCL 50 MG/ML IJ SOLN
INTRAMUSCULAR | Status: AC
  Filled 2016-08-23: qty 1

## 2016-08-23 NOTE — Progress Notes (Signed)
[facility]ILITATION     PROGRESS NOTE    Subjective/Complaints: Pt seen laying in bed this AM.  She states she slept well overnight.  Educated pt on compliance with medications, treatment plans, and follow up appointments.  She has questions about placement of dialysis access.    ROS: Denies CP, SOB, N/V/D.  Objective: Vital Signs: Blood pressure (!) 126/95, pulse 88, temperature 97.8 F (36.6 C), temperature source Oral, resp. rate 18, height 5\' 3"  (1.6 m), weight 54.3 kg (119 lb 11.4 oz), SpO2 100 %. No results found.  Recent Labs  08/21/16 1511  WBC 7.7  HGB 8.6*  HCT 27.8*  PLT 308    Recent Labs  08/21/16 1511  NA 137  K 4.8  CL 100*  GLUCOSE 94  BUN 67*  CREATININE 7.24*  CALCIUM 9.5   CBG (last 3)  No results for input(s): GLUCAP in the last 72 hours.  Wt Readings from Last 3 Encounters:  08/23/16 54.3 kg (119 lb 11.4 oz)  08/18/16 50.8 kg (111 lb 15.9 oz)  08/04/16 51.2 kg (112 lb 14 oz)    Physical Exam:  Constitutional: She appears well-developed. Small stature. NAD. HENT: Normocephalic and atraumatic.  Eyes: EOMI.  No draingage  Cardiovascular: Normal rate, regular rhythm. No JVD. LUE fistula Respiratory: No respiratory distress. She has no wheezes. She has no rales. She exhibits no tenderness  GI: BS+. She exhibits no distension. There is no tenderness. There is no rebound.  Musculoskeletal: She exhibits no edema. No tenderness. Neurological: She is alert and oriented  Strength 4-4+/5 UE prox to distal.  B/l LE 4-4+/5 HF, KE, ankle dorsi/plantar flexion 4+/5 Sensation intact to light touch Skin: No rash noted. No erythema. Warm and dry.  Assessment/Plan: 1. Functional and mobility deficits  secondary to thoracic compression fractures which require 3+ hours per day of interdisciplinary therapy in a comprehensive inpatient rehab setting. Physiatrist is providing close team supervision and 24 hour management of active  medical problems listed below. Physiatrist and rehab team continue to assess barriers to discharge/monitor patient progress toward functional and medical goals.  Function:  Bathing Bathing position   Position: Wheelchair/chair at sink  Bathing parts Body parts bathed by patient: Right arm, Left arm, Chest, Abdomen, Right upper leg, Left upper leg, Front perineal area, Buttocks, Right lower leg, Left lower leg, Back Body parts bathed by helper: Right lower leg, Left lower leg  Bathing assist Assist Level: Touching or steadying assistance(Pt > 75%)      Upper Body Dressing/Undressing Upper body dressing   What is the patient wearing?: Pull over shirt/dress, Orthosis     Pull over shirt/dress - Perfomed by patient: Thread/unthread right sleeve, Thread/unthread left sleeve, Put head through opening, Pull shirt over trunk       Orthosis activity level: Performed by patient  Upper body assist Assist Level: Set up      Lower Body Dressing/Undressing Lower body dressing   What is the patient wearing?: Underwear, Pants, Non-skid slipper socks Underwear - Performed by patient: Thread/unthread right underwear leg, Thread/unthread left underwear leg, Pull underwear up/down   Pants- Performed by patient: Thread/unthread right pants leg, Thread/unthread left pants leg, Pull pants up/down   Non-skid slipper socks- Performed by patient: Don/doff right sock, Don/doff left sock   Socks - Performed by patient: Don/doff right sock, Don/doff left sock                Lower body assist Assist for lower  body dressing: Touching or steadying assistance (Pt > 75%)      Toileting Toileting Toileting activity did not occur: No continent bowel/bladder event Toileting steps completed by patient: Adjust clothing prior to toileting, Performs perineal hygiene, Adjust clothing after toileting   Toileting Assistive Devices: Grab bar or rail  Toileting assist Assist level: Touching or steadying  assistance (Pt.75%)   Transfers Chair/bed transfer   Chair/bed transfer method: Ambulatory Chair/bed transfer assist level: Touching or steadying assistance (Pt > 75%) Chair/bed transfer assistive device: Walker, Orthosis     Locomotion Ambulation     Max distance: 25 Assist level: Touching or steadying assistance (Pt > 75%)   Wheelchair   Type: Manual Max wheelchair distance: 150 Assist Level: Supervision or verbal cues  Cognition Comprehension Comprehension assist level: Follows complex conversation/direction with no assist  Expression Expression assist level: Expresses complex ideas: With no assist  Social Interaction Social Interaction assist level: Interacts appropriately with others with medication or extra time (anti-anxiety, antidepressant). (zoloft daily)  Problem Solving Problem solving assist level: Solves complex problems: Recognizes & self-corrects  Memory Memory assist level: Complete Independence: No helper   Medical Problem List and Plan: 1.  Weakness and functional deficits due to thoracic compression fractures, T7-T8 secondary to recent fall.  Cont CIR  -Back brace when out of bed             -continue therapies 2.  DVT Prophylaxis/Anticoagulation:   Vascular study showing chronic DVT  Pt seen by Heme/Onc (appreciate recs), no need for anticoagulation at this time.  Further per Nephro, due to pt's noncompliance, at risk for bleed.   3. Pain Management with a history of narcotic abuse:   Naprosyn 500 mg BID  Oxycodone PRN, will increase as necessary  Robaxin as needed  Dilaudid for more severe pain, d/ced on 10/30  D/ced fentanyl 11/1 4. Mood: Zoloft 100 mg daily 5. Neuropsych: This patient is capable of making decisions on her own behalf. 6. Skin/Wound Care: Routine skin checks 7. 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  -HD after therapies 9.NICM/AICD. EF  12 % 10. Acute on chronic anemia.   Hb 8.6 on 10/30  Hemocult stools remains pending  Cont to monitor 11. Medical noncompliance. Counseling   LOS (Days) 5 A FACE TO FACE EVALUATION WAS PERFORMED  [name] Small [name] Small 08/23/2016 7:40 AM

## 2016-08-23 NOTE — Progress Notes (Signed)
Physical Therapy Session Note  Patient Details  Name: [name] Small MRN: [id number] Date of Birth: 1991/09/02  Today's Date: 08/23/2016 PT Individual Time: 0805-0900 PT Individual Time Calculation (min): 55 min    Short Term Goals: Week 1:  PT Short Term Goal 1 (Week 1): Patient will be able to complete supine to sit transfer with supervision PT Short Term Goal 2 (Week 1): Patient will be able to perform bed to w/c transfer with Supervision PT Short Term Goal 3 (Week 1): Patient will be able to  cover the distance of 75 feet with RW with Supervision PT Short Term Goal 4 (Week 1): Patient will be able to perform car transfer with Supervision to regular height car and min A for SUV PT Short Term Goal 5 (Week 1): Patient will be able to perform standing activities for 5 min w/o rest break and w/o LOB, no UE support  Skilled Therapeutic Interventions/Progress Updates:    Pt received in bed with TLSO donned & agreeable to tx. Pt noted 7/10 pain & RN made aware who administered pain medication. Therapist donned B tennis shoes total assist. Gait training x 15 ft with RW & min assist with tremors noted in BLE. Pt requested to sit due to pain in back & propelled w/c room>gym with BUE & supervision overall for BUE strengthening & cardiovascular endurance training. Pt completed stand pivot w/c<>nu-step with RW & min assist. Utilized nu-step up to level 2 x 12 minutes for endurance training with a single rest break after 8 minutes; resistance limited by back pain. Stair training completed on single step (6") x 2 with B rails, min assist and cuing for compensatory technique. Pt noted back pain & BLE pain with task, requiring a seated rest break after. Pt tolerated standing 5 minutes 15 seconds with 1 UE support while playing a card matching game with task focusing on standing tolerance. At end of session pt left in w/c in room with all needs within reach & NT present.  Therapy Documentation Precautions:   Precautions Precautions: Fall, Back Precaution Booklet Issued: No Precaution Comments: Patient able to state precautions. Required Braces or Orthoses: Spinal Brace Spinal Brace: Thoracolumbosacral orthotic Restrictions Weight Bearing Restrictions: No   Pain: Pain Assessment Pain Assessment: 0-10 Pain Score: 7  Pain Location: Back Pain Intervention(s): RN made aware   See Function Navigator for Current Functional Status.   Therapy/Group: Individual Therapy  SHIRI VANHUSS 08/23/2016, 9:05 AM

## 2016-08-23 NOTE — Progress Notes (Signed)
  [facility] Progress Note   Subjective: was up on her feet > 2 hrs this am, resting now.   Vitals:   08/21/16 1855 08/22/16 0602 08/22/16 1458 08/23/16 0558  BP: 132/84 114/74 129/76 (!) 126/95  Pulse: 98 (!) 47 100 88  Resp: 16 18 16 18   Temp: 98.3 F (36.8 C) 98.4 F (36.9 C) 98.5 F (36.9 C) 97.8 F (36.6 C)  TempSrc: Oral Oral Oral Oral  SpO2: 99% 99% 98% 100%  Weight: 55.9 kg (123 lb 3.8 oz)   54.3 kg (119 lb 11.4 oz)  Height:        Inpatient medications: . calcitonin (salmon)  1 spray Alternating Nares Daily  . calcitRIOL  1 mcg Oral Q M,W,F-HD  . cinacalcet  90 mg Oral Q supper  . darbepoetin (ARANESP) injection - DIALYSIS  200 mcg Intravenous Q Wed-HD  . diphenhydrAMINE  25 mg Intravenous Q M,W,F-HD  . feeding supplement (NEPRO CARB STEADY)  237 mL Oral BID BM  . ferric gluconate (FERRLECIT/NULECIT) IV  250 mg Intravenous Q M,W,F-HD  . multivitamin  1 tablet Oral QHS  . naproxen  500 mg Oral BID WC  . sertraline  100 mg Oral Daily  . sevelamer carbonate  2,400 mg Oral TID WC     sodium chloride, sodium chloride, famotidine, methocarbamol, oxyCODONE, sorbitol  Exam: Alert, large back brace Chest clear Neck no jvd Cor: RRR Abd: soft ntnd Ext: muscle loss LE's , no edema TDC L thigh  Dialysis: [place]/ [facility] MWF 4h  2/2 bath  51kg   Hep 2000  L fem perm cath (Dr Wyn Quaker placed cath) hect 1ug, no esa/ fe       Assessment:  1  ESRD HD mwf 2  Pericarditis resolved 3  HTN better, off meds 4  HD access - per [facility] needs new thigh AVG not sure when though, will d/w vasc team 5  Vol xs resolved  6  HTPH cont vD, cinacalcet 7  Vert fx per rehab 8  Nonadherence major issue  Plan - HD today, UF 2-3kg   Vinson Moselle MD [facility] pager [phone]   08/23/2016, 11:05 AM    Recent Labs Lab 08/18/16 0726 08/21/16 1511  NA 141 137  K 4.0 4.8  CL 102 100*  CO2 25 24  GLUCOSE 88 94  BUN 50* 67*  CREATININE 5.79*  7.24*  CALCIUM 9.8 9.5  PHOS 5.4* 5.1*    Recent Labs Lab 08/18/16 0726 08/21/16 1511  ALBUMIN 2.8* 3.0*    Recent Labs Lab 08/18/16 0726 08/21/16 1511  WBC 9.4 7.7  HGB 8.8* 8.6*  HCT 28.1* 27.8*  MCV 87.8 87.1  PLT 249 308   Iron/TIBC/Ferritin/ %Sat    Component Value Date/Time   IRON 17 (L) 08/18/2016 0829   TIBC 158 (L) 08/18/2016 0829   FERRITIN 576 (H) 02/22/2015 0619   IRONPCTSAT 11 08/18/2016 0829

## 2016-08-23 NOTE — Progress Notes (Signed)
Physical Therapy Session Note  Patient Details  Name: [name] Small MRN: [id number] Date of Birth: 14-Jul-1991  Today's Date: 08/23/2016 PT Individual Time: 0926-1007 PT Individual Time Calculation (min): 41 min    Short Term Goals: Week 1:  PT Short Term Goal 1 (Week 1): Patient will be able to complete supine to sit transfer with supervision PT Short Term Goal 2 (Week 1): Patient will be able to perform bed to w/c transfer with Supervision PT Short Term Goal 3 (Week 1): Patient will be able to  cover the distance of 75 feet with RW with Supervision PT Short Term Goal 4 (Week 1): Patient will be able to perform car transfer with Supervision to regular height car and min A for SUV PT Short Term Goal 5 (Week 1): Patient will be able to perform standing activities for 5 min w/o rest break and w/o LOB, no UE support  Skilled Therapeutic Interventions/Progress Updates:  Pt received in w/c; pt reporting increased pain in spine today.  Pt unsure if exercises yesterday caused increased pain or because she requested less pain medication yesterday afternoon and evening.  Will hold on exercises today and re-assess tomorrow.  Pt provided with handout on do's and don'ts of mobility and body mechanics for osteoporosis and prevention of fractures; reviewed recommendations and how to modify home activities and movements to avoid.  Pt requesting to work on balance in standing.  Pt transitioned from w/c > Biodex with UE support and min-mod A.  Pt engaged in standing balance, weight shifting and postural control training on Biodex on stable surface on level 1 Maze Runner x 4 reps with min-mod A and verbal cues for weight shifting from hips and not head.  Returned to w/c and to room where pt ambulated 15' with RW to sit EOB with min A.  Pt left with all items within reach.  No increase in pain during session.  Therapy Documentation Precautions:  Precautions Precautions: Fall, Back Precaution Booklet Issued:  No Precaution Comments: Patient able to state precautions. Required Braces or Orthoses: Spinal Brace Spinal Brace: Thoracolumbosacral orthotic Restrictions Weight Bearing Restrictions: No Pain: Pain Assessment Pain Assessment: 0-10 Pain Score: 6  Pain Type: Acute pain Pain Location: Back Pain Orientation: Mid Pain Descriptors / Indicators: Aching Pain Onset: On-going Patients Stated Pain Goal: 2 Pain Intervention(s): Emotional support (MD made aware. no new orders)   See Function Navigator for Current Functional Status.   Therapy/Group: Individual Therapy  Edman Circle [facility] 08/23/2016, 12:27 PM

## 2016-08-23 NOTE — Progress Notes (Signed)
Occupational Therapy Session Note  Patient Details  Name: [name] Small MRN: [id number] Date of Birth: Jan 04, 1991  Today's Date: 08/23/2016 OT Individual Time: 1100-1158 and 1400-1430 OT Individual Time Calculation (min): 58 min and 30 min     Short Term Goals: Week 1:  OT Short Term Goal 1 (Week 1): Pt will complete LB bathing sit to stand with min assist using AE PRN. OT Short Term Goal 2 (Week 1): Pt will complete LB dressing with min assist sit to stand.  OT Short Term Goal 3 (Week 1): Pt will perform toilet transfers with min assist using the RW and 3:1. OT Short Term Goal 4 (Week 1): Pt will perform RUE AAROM exercises with modified independence in order to increase AROM for grooming and selfcare tasks.   Skilled Therapeutic Interventions/Progress Updates:    Session 1: Upon entering the room, pt seated on EOB with increased pain in spine. Pt grimacing this session and RN notified of increased pain. Unable to give pt further pain medication this session. Pt also reporting increased anxiety with illness, dialysis, and current situation. OT provided pt with examples of various coping strategies and activities based on her interest. OT also changing shoe laces to elastic laces. Pt then able to don and doff B shoes without difficulty. Pt doffing brace independently and returning to bed at end of session.Call bell and all needed items within reach upon exiting the room.   Session 2: Upon entering the room, pt continues to report increased pain in spine. RN notified and medication given this session. OT propelled pt via wheelchair to ADL apartment. Pt requesting to problem solve mopping at home with swiffer based on precautions. OT demonstrated possible ways to perform task with use of wither rollator or RW. Pt currently showing interest in rollator for home and community use. Pt returned demonstration of ambulating in kitchen with rollator and steady assistance, turning to sit on rollator seat  while maintaining precautions, and utilizing B LEs to propel self while seated and utilizing mop to clean floor safety. Pt ambulated 40' back to room with rollator and steady assistance at end of session. Pt returned to bed with call bell and all needed items within reach upon exiting the room.   Therapy Documentation Precautions:  Precautions Precautions: Fall, Back Precaution Booklet Issued: No Precaution Comments: Patient able to state precautions. Required Braces or Orthoses: Spinal Brace Spinal Brace: Thoracolumbosacral orthotic Restrictions Weight Bearing Restrictions: No   Pain: Pain Assessment Pain Assessment: 0-10 Pain Score: 6  Pain Type: Acute pain Pain Location: Back Pain Orientation: Mid Pain Descriptors / Indicators: Aching Pain Onset: On-going Patients Stated Pain Goal: 2 Pain Intervention(s): Emotional support (MD made aware. no new orders)  See Function Navigator for Current Functional Status.   Therapy/Group: Individual Therapy  Lowella Grip 08/23/2016, 12:13 PM

## 2016-08-23 NOTE — Progress Notes (Signed)
Patient ID: [name] Small, female   DOB: 1991/07/15, 25 y.o.   MRN: [id number] Currently on dialysis via left femoral catheter.  More comfortable in relationship to her compression fracture.  2+ right femoral and 2+ dorsalis pedis and posterior tibial pulse on the right  Again discussed need for right femoral loop AV Gore-Tex graft. She wishes to have this done prior to discharge from rehabilitation. We will coordinate this probably Sonni Barse next week. Currently having dialysis on Monday Wednesday and Friday

## 2016-08-23 NOTE — Plan of Care (Signed)
Problem: RH Toileting Goal: LTG Patient will perform toileting w/assist, cues/equip (OT) LTG: Patient will perform toiletiing (clothes management/hygiene) with assist, with/without cues using equipment (OT)  Downgraded secondary to balance  Problem: RH Simple Meal Prep Goal: LTG Patient will perform simple meal prep w/assist (OT) LTG: Patient will perform simple meal prep with assistance, with/without cues (OT).  Downgraded secondary to safety  Problem: RH Toilet Transfers Goal: LTG Patient will perform toilet transfers w/assist (OT) LTG: Patient will perform toilet transfers with assist, with/without cues using equipment (OT)  Downgraded secondary to safety

## 2016-08-24 ENCOUNTER — Inpatient Hospital Stay (HOSPITAL_COMMUNITY): Payer: Medicare Other | Admitting: Physical Therapy

## 2016-08-24 ENCOUNTER — Encounter: Payer: Medicare Other | Admitting: Vascular Surgery

## 2016-08-24 ENCOUNTER — Inpatient Hospital Stay (HOSPITAL_COMMUNITY): Payer: Medicare Other | Admitting: Occupational Therapy

## 2016-08-24 ENCOUNTER — Encounter (HOSPITAL_COMMUNITY): Payer: Medicare Other

## 2016-08-24 DIAGNOSIS — F3341 Major depressive disorder, recurrent, in partial remission: Secondary | ICD-10-CM

## 2016-08-24 DIAGNOSIS — S329XXS Fracture of unspecified parts of lumbosacral spine and pelvis, sequela: Secondary | ICD-10-CM

## 2016-08-24 DIAGNOSIS — M8080XS Other osteoporosis with current pathological fracture, unspecified site, sequela: Secondary | ICD-10-CM

## 2016-08-24 DIAGNOSIS — S329XXA Fracture of unspecified parts of lumbosacral spine and pelvis, initial encounter for closed fracture: Secondary | ICD-10-CM

## 2016-08-24 DIAGNOSIS — M8080XA Other osteoporosis with current pathological fracture, unspecified site, initial encounter for fracture: Secondary | ICD-10-CM

## 2016-08-24 MED ORDER — OXYCODONE HCL 5 MG PO TABS
10.0000 mg | ORAL_TABLET | ORAL | Status: DC | PRN
  Administered 2016-08-24 – 2016-09-02 (×41): 10 mg via ORAL
  Filled 2016-08-24 (×40): qty 2

## 2016-08-24 NOTE — Progress Notes (Signed)
Physical Therapy Note  Patient Details  Name: [name] Small MRN: [id number] Date of Birth: [birth year] Today's Date: 08/24/2016    Time: [phone] 70 minutes  1:1 Pt c/o back pain, pt rec'd meds prior to session. Pt requires min A to don TLSO sitting edge of bed.  Gait training with rollator as pt states she thinks she would like to try this at home. Pt able to gait with close supervision, min A for turning to sit on rollator seat. Pt able to gait 100' x 2, 25' x 2 with rollator, limited by LE fatigue. Biodex limits of stability and maze control with cuing to shift wt from hips and pelvis, improves with repetition.  Standing tolerance with connect 4 game, pt able to stand x 8 minutes before requiring seated rest.  Nu step for LE/UE strength and activity tolerance x 12 minutes level 3.  Pt able to perform well throughout session with frequent rest breaks. Pt disappointed about team recommendation for 24 hour supervision at home, PT educated pt on her fall risk and importance of safety at home.   Destan Franchini 08/24/2016, 11:41 AM

## 2016-08-24 NOTE — Progress Notes (Addendum)
Social Work Patient ID: [name] Small, female   DOB: Jul 10, 1991, 25 y.o.   MRN: [id number]   Met with pt and mother today to review team conference information.  Both aware and agreeable with targeted d/c date of 11/11 (Sat.)  Discussed with both the tx team recommends 24/7 supervision and whether this can be provided for pt.  Mother reports that there are options for parents, sister and pt's boyfriend to piece together very close to 24/7 coverage between them all.  Explained that I would alert therapists that it may not be possible to truly cover 24/7 but could have something close arranged.  Mother notes that if there are still concerns with this then "she is always welcome to come home and stay with [place]...".   Have explained that I will have co-worker follow up next week after conference and discuss if any change in recommendation.   Have also spoken with Dr. Jonnie Finner about targeted d/c date of 11/11 and he confirms that pt is already planned to start HD at a new center in [facility] and should be on track to begin on Monday 11/13.  Have also left a message for pt's Medicaid CM about pt needing transportation assist for HD and to confirm that she is still eligible.  Have reviewed this information with pt and mother.  They are aware that I will be out the following week and covering social workers.  Lavanya Roa, LCSW

## 2016-08-24 NOTE — Consult Note (Signed)
PSYCHODIAGNOSTIC EVALUATION - [facility]   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    During the session, [name] acknowledged some physical symptoms of depression (e.g. low energy, fatigue, anhedonia, etc.), but denied feeling sadness or low mood at this time.  She explained that she has had periods of time during which she felt sad; she provided an example of a time in the past when she felt depressed for approximately 1 month.  It culminated with her having a "mental breakdown" in which she was found by her boyfriend screaming and trying to cut herself.  He then called her grandfather who took her to his home for 1 week; that reportedly helped her to regulate her mood.  She stated that there have been other less severe times when her depression came to a head as well.  She was able to identify skipping dialysis and starting arguments with her boyfriend as signs that her mood is spiraling downward.  Of note, she adamantly denied any history of suicidal ideation, intent, or plan, but noted that there have definitely been times when she felt overwhelmed and wanted to stop trying.  She is prescribed Zoloft, which helps somewhat.  [name] stated that she relies on social support (e.g. from her family or boyfriend) in order to cope; she also tries to avoid thinking about stressors and sleeps excessively to cope.  She mentioned that her biggest struggle at the current time is staying committed to dialysis and therapy.  Currently, she feels encouraged about the physical progress that she has made.  Toward the end of the session, [name] was asked about her goals for the future and she identified that she has not felt as though  she has goals lately.  Time was spent processing how that may have been affecting her sense of self-worth and in encouraging her to take time to make some new goals.  She stated that for fun, she likes to watch action movies, sleep, eat, and cook.      IMPRESSION:  [name] described a history of what sounds to be a major depressive episode.  Although her symptoms are currently not as severe and likely do not meet criteria for major depression, she is displaying some depressive symptoms and it is likely that her depressive disorder is in only partial remission at the current time.  Time was spent during today's session in encouraging her to write in her journal about the signs that she has identified that lead to severe depressed mood and action that she plans to take (e.g. calling her parents) when she notices those signs in order to prevent future major episodes.  In addition, she was encouraged to journal about possible future opportunities for volunteer work or education to help give her goals to improve her sense of self-worth.  Of note, her affect throughout the session appeared flat, but she denied sad mood and staff should be aware that her affect may not match her mood, so should ask her about her mood, rather than judging based on her facial expressions.  [name] expressed interest in participating in individual psychotherapy post-discharge and contact information for providers in her area should be included in her discharge paperwork for that purpose.  Continued follow-up with the neuropsychologist  could be requested should the treatment team feel that it would be beneficial in informing care.    DIAGNOSIS:   Major Depressive Disorder, in partial remission  Leavy CellaKaren Donda Friedli, Psy.D.  Clinical Neuropsychologist

## 2016-08-24 NOTE — Progress Notes (Addendum)
[place] PHYSICAL MEDICINE & REHABILITATION     PROGRESS NOTE    Subjective/Complaints: Pt seen laying in bed this AM.  She has questions again regarding AV access.  She requests a ground pass to go to the cafeteria with family.  She also requests pain meds q4.  ROS: +Back pain. Denies CP, SOB, N/V/D.  Objective: Vital Signs: Blood pressure (!) 118/92, pulse 93, temperature 97.9 F (36.6 C), temperature source Oral, resp. rate 16, height 5\' 3"  (1.6 m), weight 53 kg (116 lb 13.5 oz), SpO2 98 %. No results found.  Recent Labs  08/21/16 1511 08/23/16 1536  WBC 7.7 7.6  HGB 8.6* 8.3*  HCT 27.8* 27.3*  PLT 308 299    Recent Labs  08/21/16 1511 08/23/16 1536  NA 137 137  K 4.8 4.5  CL 100* 99*  GLUCOSE 94 82  BUN 67* 51*  CREATININE 7.24* 6.24*  CALCIUM 9.5 7.8*   CBG (last 3)  No results for input(s): GLUCAP in the last 72 hours.  Wt Readings from Last 3 Encounters:  08/24/16 53 kg (116 lb 13.5 oz)  08/18/16 50.8 kg (111 lb 15.9 oz)  08/04/16 51.2 kg (112 lb 14 oz)    Physical Exam:  Constitutional: She appears well-developed. Small stature. NAD. HENT: Normocephalic and atraumatic.  Eyes: EOMI.  No draingage  Cardiovascular: RRR. No JVD. Respiratory: No respiratory distress. She has no wheezes. She has no rales. She exhibits no tenderness  GI: BS+. She exhibits no distension. There is no tenderness. There is no rebound.  Musculoskeletal: She exhibits no edema. No tenderness. Neurological: She is alert and oriented  Strength 4-4+/5 UE prox to distal (unchanged).  B/l LE 4-4+/5 HF, KE, ankle dorsi/plantar flexion 4+/5 (unchanged) Skin: No rash noted. No erythema. Warm and dry.  Assessment/Plan: 1. Functional and mobility deficits  secondary to thoracic compression fractures which require 3+ hours per day of interdisciplinary therapy in a comprehensive inpatient rehab setting. Physiatrist is providing close team supervision and 24 hour management of active  medical problems listed below. Physiatrist and rehab team continue to assess barriers to discharge/monitor patient progress toward functional and medical goals.  Function:  Bathing Bathing position   Position: Wheelchair/chair at sink  Bathing parts Body parts bathed by patient: Right arm, Left arm, Chest, Abdomen, Right upper leg, Left upper leg, Front perineal area, Buttocks, Right lower leg, Left lower leg, Back Body parts bathed by helper: Right lower leg, Left lower leg  Bathing assist Assist Level: Touching or steadying assistance(Pt > 75%)      Upper Body Dressing/Undressing Upper body dressing   What is the patient wearing?: Pull over shirt/dress, Orthosis     Pull over shirt/dress - Perfomed by patient: Thread/unthread right sleeve, Thread/unthread left sleeve, Put head through opening, Pull shirt over trunk       Orthosis activity level: Performed by patient  Upper body assist Assist Level: Set up      Lower Body Dressing/Undressing Lower body dressing   What is the patient wearing?: Shoes Underwear - Performed by patient: Thread/unthread right underwear leg, Thread/unthread left underwear leg, Pull underwear up/down   Pants- Performed by patient: Thread/unthread right pants leg, Thread/unthread left pants leg, Pull pants up/down   Non-skid slipper socks- Performed by patient: Don/doff right sock, Don/doff left sock   Socks - Performed by patient: Don/doff right sock, Don/doff left sock   Shoes - Performed by patient: Don/doff right shoe, Don/doff left shoe (elastic laces)  Lower body assist Assist for lower body dressing: Set up, Supervision or verbal cues      Toileting Toileting Toileting activity did not occur: No continent bowel/bladder event Toileting steps completed by patient: Adjust clothing prior to toileting, Performs perineal hygiene, Adjust clothing after toileting   Toileting Assistive Devices: Grab bar or rail  Toileting assist  Assist level: Touching or steadying assistance (Pt.75%)   Transfers Chair/bed transfer   Chair/bed transfer method: Stand pivot Chair/bed transfer assist level: Touching or steadying assistance (Pt > 75%) Chair/bed transfer assistive device: Walker, Orthosis     Locomotion Ambulation     Max distance: 15 ft Assist level: Touching or steadying assistance (Pt > 75%)   Wheelchair   Type: Manual Max wheelchair distance: 125 ft Assist Level: Supervision or verbal cues  Cognition Comprehension Comprehension assist level: Follows complex conversation/direction with no assist  Expression Expression assist level: Expresses complex ideas: With no assist  Social Interaction Social Interaction assist level: Interacts appropriately with others with medication or extra time (anti-anxiety, antidepressant).  Problem Solving Problem solving assist level: Solves complex problems: Recognizes & self-corrects  Memory Memory assist level: Complete Independence: No helper   Medical Problem List and Plan: 1.  Weakness and functional deficits due to thoracic compression fractures, T7-T8 secondary to recent fall and recent history of pathological pelvic fracture  Cont CIR  -Back brace when out of bed             -continue therapies 2.  DVT Prophylaxis/Anticoagulation:   Vascular study showing chronic DVT  Pt seen by Heme/Onc (appreciate recs), no need for anticoagulation at this time.  Further per Nephro, due to pt's noncompliance, at risk for bleed.   3. Pain Management with a history of narcotic abuse:   Naprosyn 500 mg BID  Oxycodone PRN, will change to 10mg  q4 PRN  Robaxin as needed  Dilaudid for more severe pain, d/ced on 10/30  D/ced fentanyl 11/1 4. Mood: Zoloft 100 mg daily 5. Neuropsych: This patient is capable of making decisions on her own behalf. 6. Skin/Wound Care: Routine skin checks 7. Fluids/Electrolytes/Nutrition: encourage PO 8.ESRD due to childhood polycystic kidney disease with  history of noncompliance to dialysis.  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 Acute on chronic anemia.   Hb 8.3 on 11/1  Hemocult stools pending  Cont to monitor 11. Medical noncompliance. Counseling 12. Severe Osteoporosis  With pathological fractures  Cont current treatment   LOS (Days) 6 A FACE TO FACE EVALUATION WAS PERFORMED  Anjulie Dipierro Karis Jubanil Imaan Padgett 08/24/2016 8:08 AM

## 2016-08-24 NOTE — Progress Notes (Signed)
At end of treatment , cuff of cath was visible approx  2-3 inches out from insertion site, cath worked well during tx , pt had no complaints , no swelling noted , nephrologist to be informed by day shift.

## 2016-08-24 NOTE — Progress Notes (Signed)
Occupational Therapy Session Note  Patient Details  Name: [name] Small MRN: [id number] Date of Birth: 10-01-1991  Today's Date: 08/24/2016 OT Individual Time: 1100-1158 and 1400-1457 OT Individual Time Calculation (min): 58 min and 57 min    Short Term Goals: Week 1:  OT Short Term Goal 1 (Week 1): Pt will complete LB bathing sit to stand with min assist using AE PRN. OT Short Term Goal 2 (Week 1): Pt will complete LB dressing with min assist sit to stand.  OT Short Term Goal 3 (Week 1): Pt will perform toilet transfers with min assist using the RW and 3:1. OT Short Term Goal 4 (Week 1): Pt will perform RUE AAROM exercises with modified independence in order to increase AROM for grooming and selfcare tasks.   Skilled Therapeutic Interventions/Progress Updates:    Session 1: Upon entering the room, pt seated on EOB with 5/10 c/o back pain but agreeable to OT intervention. Pt propelled wheelchair with B UE's onto elevator and down to gift shop. Pt utilized rollator with steady assistance to ambulate on carpeted surface, navigate aisles, and pick up items without breaking precautions. Pt taking 2 seated rest breaks while in gift shop secondary to fatigue. Pt returning to her room in same manner by propelling wheelchair with B UE's. Her mother present when returning and OT provided education and answered questions regarding pt progress. Call bell and all needed items within reach.   Session 2: Pt seated on EOB upon entering the room. Pt requiring assistance to obtain all items needed for bathing and dressing from wheelchair at sink. Pt required steady assistance with standing balance when standing for LB hygiene and clothing management. Pt seated for majority of tasks secondary to energy conservation and safety. Pt ambulated with rollator 40' towards day room and therapist assist pt to make it to vending machine. Pt purchasing item while on seat of rollator in order to obtain change from machine  while maintaining precautions. Pt returned to room at end of session to bed. OT providing min cues to maintain back precautions and lock rollator when appropriate for safety. Call bell and all needed items within reach.  Therapy Documentation Precautions:  Precautions Precautions: Fall, Back Precaution Booklet Issued: No Precaution Comments: Patient able to state precautions. Required Braces or Orthoses: Spinal Brace Spinal Brace: Thoracolumbosacral orthotic Restrictions Weight Bearing Restrictions: No General:   Vital Signs:  Pain: Pain Assessment Pain Assessment: 0-10 Pain Score: 2  ADL:   Exercises:   Other Treatments:    See Function Navigator for Current Functional Status.   Therapy/Group: Individual Therapy  Lowella Grip 08/24/2016, 12:56 PM

## 2016-08-24 NOTE — Patient Care Conference (Signed)
Inpatient RehabilitationTeam Conference and Plan of Care Update Date: 08/23/2016   Time: 2:40 pm    Patient Name: [name] Small      Medical Record Number: [id number]  Date of Birth: 05-Nov-1990 Sex: Female         Room/Bed: 4M02C/4M02C-01 Payor Info: Payor: MEDICARE / Plan: MEDICARE PART A AND B / Product Type: *No Product type* /    Admitting Diagnosis: debility  Admit Date/Time:  08/18/2016  3:33 PM Admission Comments: No comment available   Primary Diagnosis:  Thoracic compression fracture [facility](HCC) Principal Problem: Thoracic compression fracture [facility](HCC)  Patient Active Problem List   Diagnosis Date Noted  . Closed nondisplaced fracture of pelvis (HCC)   . Other osteoporosis with current pathological fracture   . Recurrent major depressive disorder, in partial remission (HCC)   . Acute blood loss anemia   . 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 Anemia of chronic disease   . H/O medication noncompliance   . 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 Acute on chronic systolic CHF (congestive heart failure) (HCC) 08/16/2016  . Noncompliance of patient with renal dialysis (HCC) 08/12/2016  . 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 Osteoporosis 08/01/2016  . Thoracic compression fracture - T7, T8, T12 07/27/2016  . Acute pulmonary edema (HCC) 07/26/2016  . Essential hypertension 06/17/2016  . Depression with anxiety 06/17/2016  . Volume overload 04/14/2016  . Peripheral edema 04/14/2016  . NICM (nonischemic cardiomyopathy) (HCC) 04/14/2016  . Internal jugular (IJ) vein thromboembolism, acute (HCC) 04/14/2016  . Pericardial effusion 04/14/2016  . Chronic anticoagulation   . Hypertensive urgency 04/11/2016  . DVT (deep venous thrombosis), left 04/11/2016  . Cellulitis of left upper extremity 03/31/2016  . Chronic pain syndrome 02/29/2016  . Chronic combined systolic and diastolic congestive heart failure (HCC) 01/04/2016  .  AICD (automatic cardioverter/defibrillator) present 01/04/2016  . 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 Pathologic pelvic fracture 01/04/2016  . Depression 01/04/2016  . Pain in the chest   . ESRD on dialysis (HCC)   . Tachycardia   . Elevated serum hCG   . Chest pain 10/11/2015  . Fluid overload 10/10/2015  . Hypertension 10/10/2015  . Anemia of chronic renal failure, stage 5 (HCC) 10/04/2015  . Thrombocytopenia (HCC) 10/04/2015  . Chest pain of uncertain etiology 10/04/2015  . Chronically Elevated troponin 03/03/2015  . Hyperkalemia 02/21/2015  . Acute on chronic systolic heart failure (HCC) 02/21/2012  . Pulmonary infarct (HCC) 02/07/2012  . ESRD (end stage renal disease) on dialysis (HCC) 02/03/2012  . PE (pulmonary embolism) 02/03/2012    Expected Discharge Date: Expected Discharge Date: 09/02/16  Team Members Present: Physician leading conference: Dr. Maryla MorrowAnkit Patel Social Worker Present: Amada JupiterLucy Mathias Bogacki, LCSW Nurse Present: Carmie EndAngie Joyce, RN PT Present: Edman CircleAudra Hall, PT OT Present: Callie FieldingKatie Pittman, OT SLP Present: Claudell KyleKara Turner, SLP PPS Coordinator present : Tora DuckMarie Noel, RN, CRRN     Current Status/Progress Goal Weekly Team Focus  Medical   Weakness and functional deficits due to thoracic compression fractures, T7-T8 secondary to recent fall.  Improve mobility, safety, pt compliance, pain, chronic medical issues  See above   Bowel/Bladder   continent of bowel-LBM-08/24/16   Anuric  remain continent of bowel   educate about s/s of constipation   Swallow/Nutrition/ Hydration             ADL's   min A overall  mod I - supervision  strengthening, self care retraining with AE,  funtional transfers   Mobility   Min-mod A   supervision except min A stairs for LE strengthening and car transfer  safe strengthening, transfers, gait, balance   Communication             Safety/Cognition/ Behavioral Observations            Pain   complains of chronic back pain of 6-8/10  pain less  than or equsl to 4/10  assess pain q4h and prn, medicate as indicated   Skin   no current skin issues  no new skin injury/breakdown  assess skin q shift and prn, keep skin clean and dry    Rehab Goals Patient on target to meet rehab goals: Yes *See Care Plan and progress notes for long and short-term goals.  Barriers to Discharge: Mobility, safety, pt compliance, chronic DVTs, chronic medical issues, pain, ABLA    Possible Resolutions to Barriers:  Optimize pain meds, therapies, no tx for DVTs, HD, Hemoccult    Discharge Planning/Teaching Needs:  Pt to return to her apartment but still need to detemine level of assistance available.  Teaching to be scheduled closer to d/c.   Team Discussion:  Still c/o pain (patch d/c'd today);  Chronic DVTs.  Awaiting result of hemoccult.  Can be min-mod assist with PT/OT.  Setting goals for supervision due to decreased balance and very poor endurance.  Revisions to Treatment Plan:  None   Continued Need for Acute Rehabilitation Level of Care: The patient requires daily medical management by a physician with specialized training in physical medicine and rehabilitation for the following conditions: Daily direction of a multidisciplinary physical rehabilitation program to ensure safe treatment while eliciting the highest outcome that is of practical value to the patient.: Yes Daily medical management of patient stability for increased activity during participation in an intensive rehabilitation regime.: Yes Daily analysis of laboratory values and/or radiology reports with any subsequent need for medication adjustment of medical intervention for : Cardiac problems;Renal problems;Other  [name] Small 08/24/2016, 4:16 PM

## 2016-08-25 ENCOUNTER — Inpatient Hospital Stay (HOSPITAL_COMMUNITY): Payer: Medicare Other | Admitting: Occupational Therapy

## 2016-08-25 ENCOUNTER — Inpatient Hospital Stay (HOSPITAL_COMMUNITY): Payer: Medicare Other | Admitting: Physical Therapy

## 2016-08-25 DIAGNOSIS — F3341 Major depressive disorder, recurrent, in partial remission: Secondary | ICD-10-CM

## 2016-08-25 LAB — CBC
HEMATOCRIT: 28.1 % — AB (ref 36.0–46.0)
HEMOGLOBIN: 8.5 g/dL — AB (ref 12.0–15.0)
MCH: 27 pg (ref 26.0–34.0)
MCHC: 30.2 g/dL (ref 30.0–36.0)
MCV: 89.2 fL (ref 78.0–100.0)
PLATELETS: 288 10*3/uL (ref 150–400)
RBC: 3.15 MIL/uL — AB (ref 3.87–5.11)
RDW: 20.5 % — ABNORMAL HIGH (ref 11.5–15.5)
WBC: 8.2 10*3/uL (ref 4.0–10.5)

## 2016-08-25 LAB — RENAL FUNCTION PANEL
ANION GAP: 15 (ref 5–15)
Albumin: 2.8 g/dL — ABNORMAL LOW (ref 3.5–5.0)
BUN: 60 mg/dL — ABNORMAL HIGH (ref 6–20)
CHLORIDE: 103 mmol/L (ref 101–111)
CO2: 20 mmol/L — ABNORMAL LOW (ref 22–32)
CREATININE: 5.87 mg/dL — AB (ref 0.44–1.00)
Calcium: 9 mg/dL (ref 8.9–10.3)
GFR, EST AFRICAN AMERICAN: 11 mL/min — AB (ref >60)
GFR, EST NON AFRICAN AMERICAN: 9 mL/min — AB (ref >60)
Glucose, Bld: 94 mg/dL (ref 65–99)
POTASSIUM: 4.6 mmol/L (ref 3.5–5.1)
Phosphorus: 4.3 mg/dL (ref 2.5–4.6)
Sodium: 138 mmol/L (ref 135–145)

## 2016-08-25 MED ORDER — LIDOCAINE-PRILOCAINE 2.5-2.5 % EX CREA
1.0000 | TOPICAL_CREAM | CUTANEOUS | Status: DC | PRN
Start: 2016-08-25 — End: 2016-08-26

## 2016-08-25 MED ORDER — SODIUM CHLORIDE 0.9 % IV SOLN: 100.0000 mL | INTRAVENOUS | Status: DC | PRN

## 2016-08-25 MED ORDER — HEPARIN SODIUM (PORCINE) 1000 UNIT/ML DIALYSIS: 1000.0000 [IU] | INTRAMUSCULAR | Status: DC | PRN

## 2016-08-25 MED ORDER — ALTEPLASE 2 MG IJ SOLR: 2.0000 mg | Freq: Once | INTRAMUSCULAR | Status: DC | PRN

## 2016-08-25 MED ORDER — DIPHENHYDRAMINE HCL 50 MG/ML IJ SOLN
INTRAMUSCULAR | Status: AC
  Administered 2016-08-25: 25 mg via INTRAVENOUS
  Filled 2016-08-25: qty 1

## 2016-08-25 MED ORDER — OXYCODONE HCL 5 MG PO TABS
ORAL_TABLET | ORAL | Status: AC
  Administered 2016-08-25: 10 mg via ORAL
  Filled 2016-08-25: qty 2

## 2016-08-25 MED ORDER — PENTAFLUOROPROP-TETRAFLUOROETH EX AERO: 1.0000 "application " | INHALATION_SPRAY | CUTANEOUS | Status: DC | PRN

## 2016-08-25 MED ORDER — HEPARIN SODIUM (PORCINE) 1000 UNIT/ML DIALYSIS
2000.0000 [IU] | Freq: Once | INTRAMUSCULAR | Status: AC
  Administered 2016-08-25: 2000 [IU] via INTRAVENOUS_CENTRAL

## 2016-08-25 MED ORDER — LIDOCAINE HCL (PF) 1 % IJ SOLN: 5.0000 mL | INTRAMUSCULAR | Status: DC | PRN

## 2016-08-25 MED ORDER — LIDOCAINE 5 % EX PTCH
1.0000 | MEDICATED_PATCH | CUTANEOUS | Status: DC
  Administered 2016-08-25 – 2016-09-01 (×2): 1 via TRANSDERMAL
  Filled 2016-08-25 (×7): qty 1

## 2016-08-25 NOTE — Progress Notes (Signed)
Patient arrived to unit by bed.  Reviewed treatment plan and this RN agrees with plan.  Report received from bedside RN, Fayrene Fearing.  Consent verified.  Patient A & O X 4.   Lung sounds diminished to ausculation in all fields. Generalized edema. Cardiac:  NSR.  Removed caps and cleansed L thigh catheter with chlorhedxidine.  Aspirated ports of heparin and flushed them with saline per protocol.  Connected and secured lines, initiated treatment at 1916.  UF Goal of and net fluid removal 3.5L.  Will continue to monitor.

## 2016-08-25 NOTE — Progress Notes (Signed)
Occupational Therapy Session Note  Patient Details  Name: [name] Small MRN: [id number] Date of Birth: 1991/06/28  Today's Date: 08/25/2016 OT Individual Time: 4128-7867 and 1003-1059 OT Individual Time Calculation (min): 47 min and 56 minutes    Short Term Goals: Week 1:  OT Short Term Goal 1 (Week 1): Pt will complete LB bathing sit to stand with min assist using AE PRN. OT Short Term Goal 2 (Week 1): Pt will complete LB dressing with min assist sit to stand.  OT Short Term Goal 3 (Week 1): Pt will perform toilet transfers with min assist using the RW and 3:1. OT Short Term Goal 4 (Week 1): Pt will perform RUE AAROM exercises with modified independence in order to increase AROM for grooming and selfcare tasks.  Week 2:     Skilled Therapeutic Interventions/Progress Updates:   Pt was sitting at EOB with orthosis and father present at time of arrival. Pt was agreeable to go outside with tx focus on standing endurance as well as improved psychosocial wellness. Pt ambulated to [facility] front entrance with rollator and min guard. Pt provided encouragement regarding pursuing meaningful education opportunities at local colleges at time of d/c. Pt expresses interest in becoming PTA/OTA. Multiple rest breaks required to accommodate fatigue. Once outside, pt ambulated on uneven terrain and transferred to bench. "I don't think I can get out of it."  With encouragement, pt was able to complete with min guard. Pt self propelled w/c partway back to room and was left in w/c with father present and all needs within reach.   2nd Session 1:1 Tx (47 minutes) Pt participated in skilled OT session focusing on standing endurance, meaningful life roles, and IADL safety while adhering to back precautions. Pt ambulated with rollator down hallway to laundry room and min guard. Pt reported that laundry room is down long hallway in apartment and that boyfriend completes. Pt encouraged to complete laundry at home to  incorporate meaningful activities into routine to maintain strength/endurance that she has gained here. Pt expressed surprise in hearing that she would lose therapeutic gains if she did not exercise at home. Pt reported that she would try to get OOB each day to prevent decline. Pt completed laundry with adaptive techniques, rollator, and use of reacher as needed. Teach back technique utilized with pt exhibiting good carryover of education for loading/unloading washer and dryer. Pt then ambulated back to room and was left at EOB per request and all needs within reach.   Therapy Documentation Precautions:  Precautions Precautions: Fall, Back Precaution Booklet Issued: No Precaution Comments: Patient able to state precautions. Required Braces or Orthoses: Spinal Brace Spinal Brace: Thoracolumbosacral orthotic Restrictions Weight Bearing Restrictions: No General:   Vital Signs: Therapy Vitals Temp: 98.1 F (36.7 C) Temp Source: Oral Pulse Rate: 80 Resp: 18 BP: 125/82 Patient Position (if appropriate): Sitting Oxygen Therapy SpO2: 100 % O2 Device: Not Delivered Pain: Pt reported pain to be manageable during session with provided rest breaks   :    See Function Navigator for Current Functional Status.  Therapy/Group: Individual Therapy  Warren Kugelman A [name] 08/25/2016, 4:37 PM

## 2016-08-25 NOTE — Progress Notes (Signed)
Dialysis treatment completed.  4000 mL ultrafiltrated.  3500 mL net fluid removal.  Patient status unchanged. Lung sounds diminished to ausculation in all fields. Generalized edema. Cardiac: NSR.  Cleansed L thigh catheter with chlorhexidine.  Disconnected lines and flushed ports with saline per protocol.  Ports locked with heparin and capped per protocol.    Report given to bedside, RN Thurston Hole.

## 2016-08-25 NOTE — Progress Notes (Signed)
Physical Therapy Weekly Progress Note  Patient Details  Name: [name] Small MRN: [id number] Date of Birth: 08/20/1991  Beginning of progress report period: August 18, 2016 End of progress report period: August 25, 2016   Patient has met 4 of 5 short term goals.  Pt is improving standing tolerance and activity tolerance, continues to require close supervision or steady assist for gait and balance activities due to weakness and impaired balance reactions and therefore will continue to benefit from skilled PT intervention to enhance overall performance with activity tolerance, balance, postural control and ability to compensate for deficits.  Patient progressing toward long term goals..  Continue plan of care.  PT Short Term Goals Week 1:  PT Short Term Goal 1 (Week 1): Patient will be able to complete supine to sit transfer with supervision PT Short Term Goal 1 - Progress (Week 1): Progressing toward goal PT Short Term Goal 2 (Week 1): Patient will be able to perform bed to w/c transfer with Supervision PT Short Term Goal 2 - Progress (Week 1): Progressing toward goal PT Short Term Goal 3 (Week 1): Patient will be able to  cover the distance of 75 feet with RW with Supervision PT Short Term Goal 3 - Progress (Week 1): Met PT Short Term Goal 4 (Week 1): Patient will be able to perform car transfer with Supervision to regular height car and min A for SUV PT Short Term Goal 4 - Progress (Week 1): Progressing toward goal PT Short Term Goal 5 (Week 1): Patient will be able to perform standing activities for 5 min w/o rest break and w/o LOB, no UE support PT Short Term Goal 5 - Progress (Week 1): Progressing toward goal   Skilled Therapeutic Interventions/Progress Updates: Ambulation/gait training;Neuromuscular re-education;Stair training;Therapeutic Activities;Therapeutic Exercise;Patient/family education;Functional mobility training;UE/LE Strength taining/ROM;UE/LE Coordination  activities;Balance/vestibular training;DME/adaptive equipment instruction;Community reintegration;Pain management;Discharge planning;Splinting/orthotics;Wheelchair propulsion/positioning     See Function Navigator for Current Functional Status.   , 08/25/2016, 7:24 AM   

## 2016-08-25 NOTE — Progress Notes (Signed)
Occupational Therapy Session Note  Patient Details  Name: [name] Small MRN: [id number] Date of Birth: 05/25/1991  Today's Date: 08/25/2016 OT Individual Time: 1130-1200 OT Individual Time Calculation (min): 30 min   Short Term Goals:Week 1:  OT Short Term Goal 1 (Week 1): Pt will complete LB bathing sit to stand with min assist using AE PRN. OT Short Term Goal 2 (Week 1): Pt will complete LB dressing with min assist sit to stand.  OT Short Term Goal 3 (Week 1): Pt will perform toilet transfers with min assist using the RW and 3:1. OT Short Term Goal 4 (Week 1): Pt will perform RUE AAROM exercises with modified independence in order to increase AROM for grooming and selfcare tasks.   Skilled Therapeutic Interventions/Progress Updates:    Pt seen for OT session focusing on functional mobility and activity tolerance. Pt sitting EOB upon arrival agreeable to tx session, declined pain. She ambulated throughout session using rollator with CGA. VCs provided for upright posture, relax shoulders, and using visual cues to assist with widening steps. In ADL apartment, pt completed kitchen mobility while managing RW during functional standing tasks. She practiced obtaining and replacing items in overhead cabinet as well as accessing refrigerator and transporting items. VCs provided for problem solving. Pt ambulated back to room at end of session, left seated EOB set-up with lunch tray and all needs in reach.   Therapy Documentation Precautions:  Precautions Precautions: Fall, Back Precaution Booklet Issued: No Precaution Comments: Patient able to state precautions. Required Braces or Orthoses: Spinal Brace Spinal Brace: Thoracolumbosacral orthotic Restrictions Weight Bearing Restrictions: No  See Function Navigator for Current Functional Status.   Therapy/Group: Individual Therapy  Lewis, Julliette Frentz C 08/25/2016, 6:53 AM

## 2016-08-25 NOTE — Progress Notes (Signed)
[place] PHYSICAL MEDICINE & REHABILITATION     PROGRESS NOTE    Subjective/Complaints: Pt seen working with therapies this AM.  She states she had pain overnight and as a result, some difficulty sleeping.  She is agreeable to the lidoderm patch now.    ROS: +Back pain. Denies CP, SOB, N/V/D.  Objective: Vital Signs: Blood pressure 128/62, pulse 98, temperature 97.9 F (36.6 C), temperature source Oral, resp. rate 18, height 5\' 3"  (1.6 m), weight 53 kg (116 lb 13.5 oz), SpO2 100 %. No results found.  Recent Labs  08/23/16 1536  WBC 7.6  HGB 8.3*  HCT 27.3*  PLT 299    Recent Labs  08/23/16 1536  NA 137  K 4.5  CL 99*  GLUCOSE 82  BUN 51*  CREATININE 6.24*  CALCIUM 7.8*   CBG (last 3)  No results for input(s): GLUCAP in the last 72 hours.  Wt Readings from Last 3 Encounters:  08/24/16 53 kg (116 lb 13.5 oz)  08/18/16 50.8 kg (111 lb 15.9 oz)  08/04/16 51.2 kg (112 lb 14 oz)    Physical Exam:  Constitutional: She appears well-developed. Small stature. NAD. HENT: Normocephalic and atraumatic.  Eyes: EOMI.  No draingage  Cardiovascular: RRR. No JVD. Respiratory: No respiratory distress. She has no wheezes. She has no rales. She exhibits no tenderness  GI: BS+. She exhibits no distension. There is no tenderness. There is no rebound.  Musculoskeletal: She exhibits no edema. No TTP. Neurological: She is alert and oriented  Strength 4+/5 UE prox to distal.  B/l LE 4-4+/5 HF, KE, ankle dorsi/plantar flexion 4+/5 (stable) Skin: No rash noted. No erythema. Warm and dry.  Assessment/Plan: 1. Functional and mobility deficits  secondary to thoracic compression fractures which require 3+ hours per day of interdisciplinary therapy in a comprehensive inpatient rehab setting. Physiatrist is providing close team supervision and 24 hour management of active medical problems listed below. Physiatrist and rehab team continue to assess barriers to discharge/monitor patient  progress toward functional and medical goals.  Function:  Bathing Bathing position   Position: Wheelchair/chair at sink  Bathing parts Body parts bathed by patient: Right arm, Left arm, Chest, Abdomen, Right upper leg, Left upper leg, Front perineal area, Buttocks, Right lower leg, Left lower leg, Back Body parts bathed by helper: Right lower leg, Left lower leg  Bathing assist Assist Level: Touching or steadying assistance(Pt > 75%)      Upper Body Dressing/Undressing Upper body dressing   What is the patient wearing?: Pull over shirt/dress, Orthosis     Pull over shirt/dress - Perfomed by patient: Thread/unthread right sleeve, Thread/unthread left sleeve, Put head through opening, Pull shirt over trunk       Orthosis activity level: Performed by patient  Upper body assist Assist Level: Set up   Set up : To obtain clothing/put away  Lower Body Dressing/Undressing Lower body dressing   What is the patient wearing?: Underwear, Pants, Shoes Underwear - Performed by patient: Thread/unthread right underwear leg, Thread/unthread left underwear leg, Pull underwear up/down   Pants- Performed by patient: Thread/unthread right pants leg, Thread/unthread left pants leg, Pull pants up/down   Non-skid slipper socks- Performed by patient: Don/doff right sock, Don/doff left sock   Socks - Performed by patient: Don/doff right sock, Don/doff left sock   Shoes - Performed by patient: Don/doff right shoe, Don/doff left shoe            Lower body assist Assist for lower body dressing:  Touching or steadying assistance (Pt > 75%)      Toileting Toileting Toileting activity did not occur: No continent bowel/bladder event Toileting steps completed by patient: Adjust clothing prior to toileting, Performs perineal hygiene, Adjust clothing after toileting   Toileting Assistive Devices: Grab bar or rail  Toileting assist Assist level: Touching or steadying assistance (Pt.75%)    Transfers Chair/bed transfer   Chair/bed transfer method: Stand pivot Chair/bed transfer assist level: Touching or steadying assistance (Pt > 75%) Chair/bed transfer assistive device: Walker, Orthosis     Locomotion Ambulation     Max distance: 60 Assist level: Touching or steadying assistance (Pt > 75%)   Wheelchair   Type: Manual Max wheelchair distance: 125 ft Assist Level: Supervision or verbal cues  Cognition Comprehension Comprehension assist level: Follows complex conversation/direction with no assist  Expression Expression assist level: Expresses complex ideas: With no assist  Social Interaction Social Interaction assist level: Interacts appropriately with others with medication or extra time (anti-anxiety, antidepressant).  Problem Solving Problem solving assist level: Solves complex problems: Recognizes & self-corrects  Memory Memory assist level: Complete Independence: No helper   Medical Problem List and Plan: 1.  Weakness and functional deficits due to thoracic compression fractures, T7-T8 secondary to recent fall and recent history of pathological pelvic fracture  Cont CIR  -Back brace when out of bed  -orthostatic negative             -continue therapies 2.  DVT Prophylaxis/Anticoagulation:   Vascular study showing chronic DVT  Pt seen by Heme/Onc (appreciate recs), no need for anticoagulation at this time.  Further per Nephro, due to pt's noncompliance, at risk for bleed.   3. Pain Management with a history of narcotic abuse:   Naprosyn 500 mg BID  Oxycodone PRN, changed to 10mg  q4 PRN  Lidoderm patch added 11/3  Robaxin as needed  Dilaudid for more severe pain, d/ced on 10/30  D/ced fentanyl 11/1 4. Mood: Zoloft 100 mg daily. Appreciate Neuropsych following.   5. Neuropsych: This patient is capable of making decisions on her own behalf. 6. Skin/Wound Care: Routine skin checks 7. 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 Acute on chronic anemia.   Hb 8.3 on 11/1  Hemocult stools pending  Cont to monitor 11. Medical noncompliance. Counseling 12. Severe Osteoporosis  With pathological fractures  Cont current treatment   LOS (Days) 7 A FACE TO FACE EVALUATION WAS PERFORMED  Ankit Karis Jubanil Patel 08/25/2016 8:34 AM

## 2016-08-25 NOTE — Progress Notes (Signed)
  [facility] Progress Note   Subjective: stable  Vitals:   08/24/16 0529 08/24/16 1510 08/25/16 0531 08/25/16 1444  BP: (!) 118/92 140/88 128/62 125/82  Pulse: 93 100 98 80  Resp: 16 18 18 18   Temp: 97.9 F (36.6 C) 98.8 F (37.1 C) 97.9 F (36.6 C) 98.1 F (36.7 C)  TempSrc: Oral Oral Oral Oral  SpO2: 98% 100% 100% 100%  Weight: 53 kg (116 lb 13.5 oz)     Height:        Inpatient medications: . calcitonin (salmon)  1 spray Alternating Nares Daily  . calcitRIOL  1 mcg Oral Q M,W,F-HD  . cinacalcet  90 mg Oral Q supper  . darbepoetin (ARANESP) injection - DIALYSIS  200 mcg Intravenous Q Wed-HD  . diphenhydrAMINE  25 mg Intravenous Q M,W,F-HD  . feeding supplement (NEPRO CARB STEADY)  237 mL Oral BID BM  . ferric gluconate (FERRLECIT/NULECIT) IV  250 mg Intravenous Q M,W,F-HD  . lidocaine  1 patch Transdermal Q24H  . multivitamin  1 tablet Oral QHS  . naproxen  500 mg Oral BID WC  . sertraline  100 mg Oral Daily  . sevelamer carbonate  2,400 mg Oral TID WC     famotidine, methocarbamol, oxyCODONE, sorbitol  Exam: Alert, large back brace Chest clear Neck no jvd Cor: RRR Abd: soft ntnd Ext: muscle loss LE's , no edema TDC L thigh  Dialysis: [place]/ [facility] MWF 4h  2/2 bath  51kg   Hep 2000  L fem perm cath (Dr Wyn Quaker placed cath) hect 1ug, no esa/ fe       Assessment:  1  ESRD HD mwf 2  Pericarditis resolved 3  HTN better, off meds 4  HD access - for thigh AVG next week 5  Vol xs resolved  6  HTPH cont vD, cinacalcet 7  Vert fx per rehab 8  Nonadherence major issue  Plan - HD today   Vinson Moselle MD [facility] pager [phone]   08/25/2016, 4:23 PM    Recent Labs Lab 08/21/16 1511 08/23/16 1536  NA 137 137  K 4.8 4.5  CL 100* 99*  CO2 24 23  GLUCOSE 94 82  BUN 67* 51*  CREATININE 7.24* 6.24*  CALCIUM 9.5 7.8*  PHOS 5.1* 3.9    Recent Labs Lab 08/21/16 1511 08/23/16 1536  ALBUMIN 3.0* 3.0*     Recent Labs Lab 08/21/16 1511 08/23/16 1536  WBC 7.7 7.6  HGB 8.6* 8.3*  HCT 27.8* 27.3*  MCV 87.1 88.3  PLT 308 299   Iron/TIBC/Ferritin/ %Sat    Component Value Date/Time   IRON 17 (L) 08/18/2016 0829   TIBC 158 (L) 08/18/2016 0829   FERRITIN 576 (H) 02/22/2015 0619   IRONPCTSAT 11 08/18/2016 0829

## 2016-08-25 NOTE — Progress Notes (Signed)
Physical Therapy Note  Patient Details  Name: [name] Small MRN: [id number] Date of Birth: 09-05-1991 Today's Date: 08/25/2016    Time: 800-855 55 minutes  1:1 Pt c/o back pain, RN made aware, repositioned and rest as needed throughout session. Pt performed gait training with rollator with close supervision in home and controlled environments with standing rest breaks only pt able to gait 170' x 2.  Standing tolerance and balance with 1 UE support to play wii games. Pt able to stand 15 mins, 10 mins without seated rest while playing game. Pt unable to stand > 10 seconds without UE support. Pt with improved activity tolerance today vs last session.   [name] 08/25/2016, 8:56 AM

## 2016-08-26 ENCOUNTER — Inpatient Hospital Stay (HOSPITAL_COMMUNITY): Payer: Medicare Other | Admitting: Occupational Therapy

## 2016-08-26 DIAGNOSIS — S22000D Wedge compression fracture of unspecified thoracic vertebra, subsequent encounter for fracture with routine healing: Secondary | ICD-10-CM

## 2016-08-26 LAB — OCCULT BLOOD X 1 CARD TO LAB, STOOL: FECAL OCCULT BLD: POSITIVE — AB

## 2016-08-26 NOTE — Progress Notes (Signed)
Patient ID: [name] Small, female   DOB: 1991/01/27, 25 y.o.   MRN: [id number]   08/26/16.   [place] PHYSICAL MEDICINE & REHABILITATION     PROGRESS NOTE    Subjective/Complaints:  Comfortable night.  No significant back pain this am  ROS: +Back pain. Denies CP, SOB, N/V/D.  Past Medical History:  Diagnosis Date  . AICD (automatic cardioverter/defibrillator) present 12/16/14   [employer]  . Anemia   . Anxiety   . Cardiomyopathy   . Cellulitis and abscess of face 03/22/2013  . CHF (congestive heart failure) (HCC)   . Chronic anticoagulation   . Chronic pain   . Depression   . DVT (deep venous thrombosis) (HCC) ~ 2014   BLE  . Dysrhythmia    at times per pt.  . 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 ESRD (end stage renal disease) on dialysis [facility](HCC)    "MWF; [facility]" (08/15/2016)  . Fracture, thoracic vertebra (HCC) 07/2016   "T7-T12"  . GERD (gastroesophageal reflux disease)   . H/O transfusion of packed red blood cells   . Heart murmur   . Hemodialysis patient (HCC)    Mon. Wed. Fri  . Hyperkalemia 09/2015  . Hypertension   . Narcotic abuse, continuous   . Osteoporosis   . Pelvic fracture (HCC) 06/2015   "on the right"  . Polycystic kidney disease   . Pulmonary emboli (HCC) 01/2012   Bilateral, moderate clot burden, areas of pulmonary infarction and central necrosis  . Renal insufficiency   . Sickle cell anemia (HCC)       Objective: Vital Signs: Blood pressure (!) 122/96, pulse 93, temperature 98.6 F (37 C), temperature source Oral, resp. rate 20, height 5\' 3"  (1.6 m), weight 113 lb 1.5 oz (51.3 kg), SpO2 100 %. No results found.  Recent Labs  08/23/16 1536 08/25/16 1923  WBC 7.6 8.2  HGB 8.3* 8.5*  HCT 27.3* 28.1*  PLT 299 288    Recent Labs  08/23/16 1536 08/25/16 1924  NA 137 138  K 4.5 4.6  CL 99* 103   GLUCOSE 82 94  BUN 51* 60*  CREATININE 6.24* 5.87*  CALCIUM 7.8* 9.0   CBG (last 3)  No results for input(s): GLUCAP in the last 72 hours.  Wt Readings from Last 3 Encounters:  08/26/16 113 lb 1.5 oz (51.3 kg)  08/18/16 111 lb 15.9 oz (50.8 kg)  08/04/16 112 lb 14 oz (51.2 kg)   BP Readings from Last 3 Encounters:  08/26/16 (!) 122/96  08/18/16 (!) 119/92  08/04/16 (!) 87/57   Physical Exam:  Constitutional: She appears well-developed.  Comfortable. NAD. HENT: Normocephalic and atraumatic.  Eyes: EOMI.  Cardiovascular: RRR. No JVD. Respiratory: No respiratory distress. She has no wheezes. She has no rales. She exhibits no tenderness  GI: BS+. She exhibits no distension. There is no tenderness. There is no rebound.  Musculoskeletal: She exhibits no edema. No TTP. Neurological: She is alert and oriented  Extremities-no edema Skin: No rash noted. No erythema. Warm and dry.    Medical Problem List and Plan: 1.  Weakness and functional deficits due to thoracic compression fractures, T7-T8 secondary to recent fall and recent history of pathological pelvic fracture  Cont CIR  -Back brace when out of bed  2.  DVT Prophylaxis/Anticoagulation:   Vascular study showing chronic DVT  Pt seen by Heme/Onc (appreciate recs),  no need for anticoagulation at this time.  Further per Nephro, due to pt's noncompliance, at risk for bleed.   3. Pain Management with a history of narcotic abuse:   Naprosyn 500 mg BID  Oxycodone PRN, changed to 10mg  q4 PRN  Lidoderm patch added 11/3  Robaxin as needed  Dilaudid for more severe pain, d/ced on 10/30  D/ced fentanyl 11/1  4. ESRD due to childhood polycystic kidney disease with history of noncompliance to dialysis.  -HD after therapies 5. NICM/AICD. EF 12 % 6. Acute on chronic anemia.   Hb 8.3 on 11/1  Hemocult stools pending  Cont to monitor  7. Severe Osteoporosis  With pathological fractures  Cont current treatment   LOS (Days)  8 A FACE TO FACE EVALUATION WAS PERFORMED  Rogelia Boga 08/26/2016 1:29 PM

## 2016-08-26 NOTE — Progress Notes (Signed)
Occupational Therapy Session Note  Patient Details  Name: [name] Small MRN: [id number] Date of Birth: [birth year]  Today's Date: 08/26/2016 OT Individual Time: 3845-3646 OT Individual Time Calculation (min): 57 min , Today's Date: 08/26/2016    Short Term Goals: Week 1:  OT Short Term Goal 1 (Week 1): Pt will complete LB bathing sit to stand with min assist using AE PRN. OT Short Term Goal 2 (Week 1): Pt will complete LB dressing with min assist sit to stand.  OT Short Term Goal 3 (Week 1): Pt will perform toilet transfers with min assist using the RW and 3:1. OT Short Term Goal 4 (Week 1): Pt will perform RUE AAROM exercises with modified independence in order to increase AROM for grooming and selfcare tasks.       Skilled Therapeutic Interventions/Progress Updates:   Skilled OT session completed with focus on standing endurance and developing meaningful life roles at time of discharge. Pt ambulated to dayroom with min guard and multiple standing/seated rest breaks with rollator. In dayroom pt was oriented to [employer] on laptop and looked up online classes of interest for associates degrees. Pt ultimately decided on pursuing General Studies and was provided with application information during this session. Pt provided encouragement to enhance self efficacy and confidence to maximize quality of life and feelings of purpose by advancing education. Pt ambulated back to room and was left at EOB, per request, with all needs within reach.   Therapy Documentation Precautions:  Precautions Precautions: Fall, Back Precaution Booklet Issued: No Precaution Comments: Patient able to state precautions. Required Braces or Orthoses: Spinal Brace Spinal Brace: Thoracolumbosacral orthotic Restrictions Weight Bearing Restrictions: No  Pain: No c/o pain during session  Pain Assessment Pain Score: 7  ADL:  :    See Function Navigator for Current Functional Status.   Therapy/Group: Individual  Therapy  Wadie Mattie A Zamauri Nez 08/26/2016, 12:45 PM

## 2016-08-26 NOTE — Progress Notes (Signed)
[facility] Progress Note   Subjective:  "I'm doing OK. My back pain is becoming manageable now". Sitting up at bedside, back brace off. Says she had therapy session this AM and walked with walker.    Objective Vitals:   08/25/16 2316 08/25/16 2320 08/26/16 0029 08/26/16 0651  BP: 126/84 124/85 118/89 (!) 122/96  Pulse: 64 92 95 93  Resp: 12  18 20   Temp: 98 F (36.7 C)  99.2 F (37.3 C) 98.6 F (37 C)  TempSrc:   Oral Oral  SpO2:   100% 100%  Weight: 52.6 kg (115 lb 15.4 oz)   51.3 kg (113 lb 1.5 oz)  Height:       Physical Exam General: Pleasant, NAD.  Heart: S1,S2, RRR Lungs: CTAB Abdomen: Soft, non-tender Extremities: No LE edema Dialysis Access: L thigh TDC  Dialysis: [place]/ [facility] MWF 4h  2/2 bath  51kg   Hep 2000  L fem perm cath (Dr Wyn Quakerew placed cath) hect 1ug, no esa/ fe  Additional Objective Labs: Basic Metabolic Panel:  Recent Labs Lab 08/21/16 1511 08/23/16 1536 08/25/16 1924  NA 137 137 138  K 4.8 4.5 4.6  CL 100* 99* 103  CO2 24 23 20*  GLUCOSE 94 82 94  BUN 67* 51* 60*  CREATININE 7.24* 6.24* 5.87*  CALCIUM 9.5 7.8* 9.0  PHOS 5.1* 3.9 4.3   Liver Function Tests:  Recent Labs Lab 08/21/16 1511 08/23/16 1536 08/25/16 1924  ALBUMIN 3.0* 3.0* 2.8*   No results for input(s): LIPASE, AMYLASE in the last 168 hours. CBC:  Recent Labs Lab 08/21/16 1511 08/23/16 1536 08/25/16 1923  WBC 7.7 7.6 8.2  HGB 8.6* 8.3* 8.5*  HCT 27.8* 27.3* 28.1*  MCV 87.1 88.3 89.2  PLT 308 299 288   Blood Culture    Component Value Date/Time   SDES BLOOD RIGHT EJ 03/31/2016 1830   SPECREQUEST BOTTLES DRAWN AEROBIC AND ANAEROBIC  3CC 03/31/2016 1830   CULT NO GROWTH 5 DAYS 03/31/2016 1830   REPTSTATUS 04/05/2016 FINAL 03/31/2016 1830    Cardiac Enzymes: No results for input(s): CKTOTAL, CKMB, CKMBINDEX, TROPONINI in the last 168 hours. CBG: No results for input(s): GLUCAP in the last 168 hours. Iron Studies: No results for  input(s): IRON, TIBC, TRANSFERRIN, FERRITIN in the last 72 hours. @lablastinr3 @ Studies/Results: No results found. Medications:   . calcitonin (salmon)  1 spray Alternating Nares Daily  . calcitRIOL  1 mcg Oral Q M,W,F-HD  . cinacalcet  90 mg Oral Q supper  . darbepoetin (ARANESP) injection - DIALYSIS  200 mcg Intravenous Q Wed-HD  . diphenhydrAMINE  25 mg Intravenous Q M,W,F-HD  . feeding supplement (NEPRO CARB STEADY)  237 mL Oral BID BM  . ferric gluconate (FERRLECIT/NULECIT) IV  250 mg Intravenous Q M,W,F-HD  . lidocaine  1 patch Transdermal Q24H  . multivitamin  1 tablet Oral QHS  . naproxen  500 mg Oral BID WC  . sertraline  100 mg Oral Daily  . sevelamer carbonate  2,400 mg Oral TID WC     Assessment/Plan: 1. Pericarditis: Resolved. 2. Vertebral Fx: In patient rehab. Brace off at present.  3. ESRD -MWF HP [facility]. Noncompliant with HD. Next HD Monday.  4. Anemia - HGB 8.5. Last ESA  Aranesp 200 mcg IV 08/23/16   5. Secondary hyperparathyroidism - Ca 9.0 C Ca 9.96 Phos 4.3. Cont binders, VDRA, sensipar 6. HTN/volume - HD yesterday. Net UF 3500. Post wt 52.6. BP stable. No anti-hypertensive meds ordered.  7. Nutrition - Albumin 2.8. Renal diet, nepro, renal vit   Rita H. Brown NP-C 08/26/2016, 11:55 AM  [facility] [phone]  Pt seen, examined and agree w A/P as above.  Vinson Moselle MD [facility] pager [phone]   08/26/2016, 12:51 PM

## 2016-08-27 ENCOUNTER — Inpatient Hospital Stay (HOSPITAL_COMMUNITY): Payer: Medicare Other | Admitting: Occupational Therapy

## 2016-08-27 LAB — OCCULT BLOOD X 1 CARD TO LAB, STOOL: Fecal Occult Bld: POSITIVE — AB

## 2016-08-27 NOTE — Progress Notes (Signed)
[facility] Progress Note   Subjective:  Awake, pleasant, NAD. Sitting up at bedside, wearing back brace. Concerned about AVG placement. Otherwise, no new issues.   Objective Vitals:   08/26/16 0029 08/26/16 0651 08/26/16 1539 08/27/16 0548  BP: 118/89 (!) 122/96 110/78 (!) 144/97  Pulse: 95 93 98 99  Resp: 18 20 18 18   Temp: 99.2 F (37.3 C) 98.6 F (37 C) 99 F (37.2 C) 97.8 F (36.6 C)  TempSrc: Oral Oral Oral Oral  SpO2: 100% 100% 100% 98%  Weight:  51.3 kg (113 lb 1.5 oz)  53.6 kg (118 lb 2.7 oz)  Height:       Physical Exam General: Pleasant, NAD.  Heart: S1,S2, RRR Lungs: CTAB although back brace make exam difficult Abdomen: Soft, non-tender Extremities: No LE edema Dialysis Access: L thigh TDC Drsg  CDI  Dialysis:[place]/ [facility] MWF 4h 2/2 bath 51kg 400/800Hep 2000 L fem perm cath (Dr Wyn Quakerew placed cath) hect 1ug, no esa/ fe  Additional Objective Labs: Basic Metabolic Panel:  Recent Labs Lab 08/21/16 1511 08/23/16 1536 08/25/16 1924  NA 137 137 138  K 4.8 4.5 4.6  CL 100* 99* 103  CO2 24 23 20*  GLUCOSE 94 82 94  BUN 67* 51* 60*  CREATININE 7.24* 6.24* 5.87*  CALCIUM 9.5 7.8* 9.0  PHOS 5.1* 3.9 4.3   Liver Function Tests:  Recent Labs Lab 08/21/16 1511 08/23/16 1536 08/25/16 1924  ALBUMIN 3.0* 3.0* 2.8*   No results for input(s): LIPASE, AMYLASE in the last 168 hours. CBC:  Recent Labs Lab 08/21/16 1511 08/23/16 1536 08/25/16 1923  WBC 7.7 7.6 8.2  HGB 8.6* 8.3* 8.5*  HCT 27.8* 27.3* 28.1*  MCV 87.1 88.3 89.2  PLT 308 299 288   Blood Culture    Component Value Date/Time   SDES BLOOD RIGHT EJ 03/31/2016 1830   SPECREQUEST BOTTLES DRAWN AEROBIC AND ANAEROBIC  3CC 03/31/2016 1830   CULT NO GROWTH 5 DAYS 03/31/2016 1830   REPTSTATUS 04/05/2016 FINAL 03/31/2016 1830    Cardiac Enzymes: No results for input(s): CKTOTAL, CKMB, CKMBINDEX, TROPONINI in the last 168 hours. CBG: No results for input(s):  GLUCAP in the last 168 hours. Iron Studies: No results for input(s): IRON, TIBC, TRANSFERRIN, FERRITIN in the last 72 hours. @lablastinr3 @ Studies/Results: No results found. Medications:  . calcitonin (salmon)  1 spray Alternating Nares Daily  . calcitRIOL  1 mcg Oral Q M,W,F-HD  . cinacalcet  90 mg Oral Q supper  . darbepoetin (ARANESP) injection - DIALYSIS  200 mcg Intravenous Q Wed-HD  . diphenhydrAMINE  25 mg Intravenous Q M,W,F-HD  . feeding supplement (NEPRO CARB STEADY)  237 mL Oral BID BM  . ferric gluconate (FERRLECIT/NULECIT) IV  250 mg Intravenous Q M,W,F-HD  . lidocaine  1 patch Transdermal Q24H  . multivitamin  1 tablet Oral QHS  . naproxen  500 mg Oral BID WC  . sertraline  100 mg Oral Daily  . sevelamer carbonate  2,400 mg Oral TID WC     Assessment/Plan: 1. Pericarditis: Resolved. 2. Vertebral Fxs: In patient rehab. Wearing brace. Pain being managed by primary.  3. ESRD -MWF HP [facility]. Noncompliant with HD. Next HD tomorrow. Usual heparin dose. 4. Anemia - HGB 8.5. Last ESA  Aranesp 200 mcg IV 08/23/16. Recheck CBC in HD tomorrow.  5. Secondary hyperparathyroidism - Ca 9.0 C Ca 9.96 Phos 4.3. Cont binders, VDRA, sensipar 6. HTN/volume - HD 08/25/16.  Net UF 3500. Post wt 52.6. BP  stable. No anti-hypertensive meds ordered. For HD tomorrow, wt today 53.6 kg attempt 2.5-3 liters tomorrow.   7. Nutrition - Albumin 2.8. Renal diet, nepro, renal vit  8. AVG Placement: Dr. Arbie Cookey consulted, planning placement of loop AVG. Per [facility] notes will be done this week prior to dc home.    Rita H. Brown NP-C 08/27/2016, 10:50 AM  [facility] [phone]

## 2016-08-27 NOTE — Progress Notes (Signed)
Occupational Therapy Session Note  Patient Details  Name: [name] Small MRN: [id number] Date of Birth: 1991/05/13  Today's Date: 08/27/2016 OT Individual Time: 0850-0950 OT Individual Time Calculation (min): 60 min   Skilled Therapeutic Interventions/Progress Updates:   Patient participated in skilled OT today as follows: Crossed leg over knee technique for donning socks and shoes with set up.   She also demonstrated using the sock aide to don socks.    Though at the beginning of the session, she was able to verbalize no BAT back precautions, during functional tasks, she did tend to twist slightly at the waist when reaching objects on the bed behind her.   As well, she twisted at the waist standing to place object on bed behind her.    She was reminded to turn her trunk and hips and upper thighs together when standing and sitting 'sort of like a robot' in order to not twist her back.  During transfers (including in and out of ADL apt bed), she demonstrated adherence to back precautions.    At session's end, she was left seated on the edge of her bed with call bell and phone within reach.  Therapy Documentation Precautions:  Precautions Precautions: Fall, Back Precaution Booklet Issued: No Precaution Comments: Patient able to state precautions. Required Braces or Orthoses: Spinal Brace Spinal Brace: Thoracolumbosacral orthotic Restrictions Weight Bearing Restrictions: No   Pain:denied  Therapy/Group: Individual Therapy  Bud Face [facility] 08/27/2016, 3:46 PM

## 2016-08-27 NOTE — Progress Notes (Signed)
Patient ID: [name] Small, female   DOB: Sep 05, 1991, 25 y.o.   MRN: [id number]   08/27/16.   [place] PHYSICAL MEDICINE & REHABILITATION     PROGRESS NOTE    Subjective/Complaints:  Comfortable night.  No significant back pain this am.  No new concerns or complaints  ROS: +Back pain. Denies CP, SOB, N/V/D.  Past Medical History:  Diagnosis Date  . 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 Anemia   . Anxiety   . Cardiomyopathy   . Cellulitis and abscess of face 03/22/2013  . CHF (congestive heart failure) (HCC)   . Chronic anticoagulation   . Chronic pain   . Depression   . DVT (deep venous thrombosis) (HCC) ~ 2014   BLE  . Dysrhythmia    at times per pt.  . 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 ESRD (end stage renal disease) on dialysis [facility])    "MWF; [facility]" (08/15/2016)  . Fracture, thoracic vertebra (HCC) 07/2016   "T7-T12"  . GERD (gastroesophageal reflux disease)   . H/O transfusion of packed red blood cells   . Heart murmur   . Hemodialysis patient (HCC)    Mon. Wed. Fri  . Hyperkalemia 09/2015  . Hypertension   . Narcotic abuse, continuous   . Osteoporosis   . Pelvic fracture (HCC) 06/2015   "on the right"  . Polycystic kidney disease   . Pulmonary emboli (HCC) 01/2012   Bilateral, moderate clot burden, areas of pulmonary infarction and central necrosis  . Renal insufficiency   . Sickle cell anemia (HCC)       Objective: Vital Signs: Blood pressure (!) 144/97, pulse 99, temperature 97.8 F (36.6 C), temperature source Oral, resp. rate 18, height 5\' 3"  (1.6 m), weight 118 lb 2.7 oz (53.6 kg), SpO2 98 %. No results found.  Recent Labs  08/25/16 1923  WBC 8.2  HGB 8.5*  HCT 28.1*  PLT 288    Recent Labs  08/25/16 1924  NA 138  K 4.6  CL 103  GLUCOSE 94  BUN 60*  CREATININE  5.87*  CALCIUM 9.0   CBG (last 3)  No results for input(s): GLUCAP in the last 72 hours.  Wt Readings from Last 3 Encounters:  08/27/16 118 lb 2.7 oz (53.6 kg)  08/18/16 111 lb 15.9 oz (50.8 kg)  08/04/16 112 lb 14 oz (51.2 kg)   BP Readings from Last 3 Encounters:  08/27/16 (!) 144/97  08/18/16 (!) 119/92  08/04/16 (!) 87/57   Physical Exam:  Constitutional: She appears well-developed.  Comfortable. NAD. HENT: Normocephalic and atraumatic.  Eyes: EOMI.  Cardiovascular: RRR. No JVD. Respiratory: No respiratory distress. She has no wheezes. She has no rales. She exhibits no tenderness  GI: BS+. She exhibits no distension. There is no tenderness. There is no rebound.  Musculoskeletal: She exhibits no edema. No TTP. Neurological: She is alert and oriented  Extremities-no edema.  Back brace in place Skin: No rash noted. No erythema. Warm and dry.    Medical Problem List and Plan: 1.  Weakness and functional deficits due to thoracic compression fractures, T7-T8 secondary to recent fall and recent history of pathological pelvic fracture  Cont CIR  -Back brace when out of bed  2.  DVT Prophylaxis/Anticoagulation:   Vascular study showing chronic DVT  Pt seen by Heme/Onc (appreciate recs), no need for anticoagulation  at this time.  Further per Nephro, due to pt's noncompliance, at risk for bleed.   3. Pain Management with a history of narcotic abuse:   Naprosyn 500 mg BID  Oxycodone PRN, changed to 10mg  q4 PRN  Lidoderm patch added 11/3  Robaxin as needed  4. ESRD due to childhood polycystic kidney disease with history of noncompliance to dialysis.  -HD after therapies 5. NICM/AICD. EF 12 % 6. Acute on chronic anemia.   Hb 8.3 on 11/1  Cont to monitor  7. Severe Osteoporosis  With pathological fractures  Cont current treatment   LOS (Days) 9 A FACE TO FACE EVALUATION WAS PERFORMED  Rogelia BogaKWIATKOWSKI,Trampas Stettner FRANK 08/27/2016 9:36 AM

## 2016-08-28 ENCOUNTER — Inpatient Hospital Stay (HOSPITAL_COMMUNITY): Payer: Medicare Other | Admitting: Occupational Therapy

## 2016-08-28 ENCOUNTER — Ambulatory Visit: Payer: Medicare Other | Admitting: Family

## 2016-08-28 ENCOUNTER — Inpatient Hospital Stay (HOSPITAL_COMMUNITY): Payer: Medicare Other | Admitting: Physical Therapy

## 2016-08-28 ENCOUNTER — Inpatient Hospital Stay (HOSPITAL_COMMUNITY): Payer: Medicare Other

## 2016-08-28 DIAGNOSIS — R0989 Other specified symptoms and signs involving the circulatory and respiratory systems: Secondary | ICD-10-CM

## 2016-08-28 LAB — RENAL FUNCTION PANEL
ANION GAP: 15 (ref 5–15)
Albumin: 3.3 g/dL — ABNORMAL LOW (ref 3.5–5.0)
BUN: 77 mg/dL — ABNORMAL HIGH (ref 6–20)
CHLORIDE: 98 mmol/L — AB (ref 101–111)
CO2: 23 mmol/L (ref 22–32)
CREATININE: 7.59 mg/dL — AB (ref 0.44–1.00)
Calcium: 9.4 mg/dL (ref 8.9–10.3)
GFR, EST AFRICAN AMERICAN: 8 mL/min — AB (ref >60)
GFR, EST NON AFRICAN AMERICAN: 7 mL/min — AB (ref >60)
Glucose, Bld: 95 mg/dL (ref 65–99)
POTASSIUM: 5.3 mmol/L — AB (ref 3.5–5.1)
Phosphorus: 5.8 mg/dL — ABNORMAL HIGH (ref 2.5–4.6)
Sodium: 136 mmol/L (ref 135–145)

## 2016-08-28 LAB — CBC
HEMATOCRIT: 29.4 % — AB (ref 36.0–46.0)
HEMOGLOBIN: 9.2 g/dL — AB (ref 12.0–15.0)
MCH: 28.1 pg (ref 26.0–34.0)
MCHC: 31.3 g/dL (ref 30.0–36.0)
MCV: 89.9 fL (ref 78.0–100.0)
PLATELETS: 342 10*3/uL (ref 150–400)
RBC: 3.27 MIL/uL — AB (ref 3.87–5.11)
RDW: 22 % — ABNORMAL HIGH (ref 11.5–15.5)
WBC: 10 10*3/uL (ref 4.0–10.5)

## 2016-08-28 MED ORDER — DIPHENHYDRAMINE HCL 50 MG/ML IJ SOLN
INTRAMUSCULAR | Status: AC
  Filled 2016-08-28: qty 1

## 2016-08-28 MED ORDER — CALCITRIOL 0.5 MCG PO CAPS
ORAL_CAPSULE | ORAL | Status: AC
  Filled 2016-08-28: qty 2

## 2016-08-28 NOTE — Progress Notes (Signed)
Physical Therapy Note  Patient Details  Name: [name] Small MRN: [id number] Date of Birth: 01-19-1991 Today's Date: 08/28/2016  [id number], 30 min individual tx Pain: 7/10 low back, premedicated  Pt donned shoes sitting EOB with suprvision.  Transferred to w/c.  Balance retraing standing on [employer] for wt shifting training, A-P, R-L, and diagonals, with bil UE support fading to 0 UE support with min guard assist.  L knee noted to be unstable, but did not buckle.  Gait training with 4WW x x 50'.  Pt left resting sitting EOB with bed alarm set and all needs within reach; RN present.  See Function Navigator for current mobility status.    [name] Small 08/28/2016, 7:51 AM

## 2016-08-28 NOTE — Progress Notes (Signed)
Occupational Therapy Weekly Progress Note  Patient Details  Name: [name] Small MRN: [id number] Date of Birth: 11/06/1990  Beginning of progress report period: 08/19/16 End of progress report period: 08/28/16 Today's Date: 08/28/2016  Patient has met 4 of 4 short term goals.    Patient continues to demonstrate the following deficits: activity tolerance, standing endurance, volition, and upper body strength and therefore will continue to benefit from skilled OT intervention to enhance overall performance with BADL.  Ms. Hoppes has improved BADL levels since time of evaluation, where she required Mod A and Mod A for functional transfers also. At time of report, pt is completing self care and transfers at [place] level. Activity tolerance, standing endurance, and UE/LE strength has improved. Pt still exhibits fatigue and requires rest breaks when ambulating long distances. For remainder of pts stay, skilled OT will continue to focus on stated deficits in order to maximize independence with BADLs/IADLs.   Patient progressing toward long term goals..  Continue plan of care.  OT Short Term Goals Week 2:  OT Short Term Goal 1 (Week 2): STGs=LTGs due to ELOS  Therapy Documentation Precautions:  Precautions Precautions: Fall, Back Precaution Booklet Issued: No Precaution Comments: Patient able to state precautions. Required Braces or Orthoses: Spinal Brace Spinal Brace: Thoracolumbosacral orthotic Restrictions Weight Bearing Restrictions: No General:   Vital Signs: Therapy Vitals Temp: 97.9 F (36.6 C) Temp Source: Oral Pulse Rate: 100 Resp: 18 BP: 133/86 Patient Position (if appropriate): Sitting Oxygen Therapy SpO2: 98 % O2 Device: Not Delivered Pain: Pain Assessment Pain Assessment: No/denies pain Pain Score: 8     See Function Navigator for Current Functional Status.   Therapy/Group: Individual Therapy  Amara Justen A Corrie Brannen 08/28/2016, 8:05 PM

## 2016-08-28 NOTE — Progress Notes (Signed)
Physical Therapy Session Note  Patient Details  Name: [name] Small MRN: [id number] Date of Birth: 1991-02-09  Today's Date: 08/28/2016 PT Individual Time: 1305-1406 PT Individual Time Calculation (min): 61 min    Short Term Goals: Week 2:  PT Short Term Goal 1 (Week 2): = LTG with D/C end of week 11/11  Skilled Therapeutic Interventions/Progress Updates:    Pt resting sitting EOB on arrival, no c/o pain, and agreeable to therapy session.  Session focus on activity tolerance, gait training, pacing, and therapeutic conversation.  Pt transitioned sit<>stand from EOB and from standard height chair x2 with supervision during session.  Gait training >300' from pt's room to day room with 3 standing rest breaks and min verbal cues for pacing.  Pt engaged in therapeutic activity painting while engaged in conversation regarding pt's interests and motivating factors when outside of the hospital, focus on setting reasonable and achievable goals to increase motivation for daily activity.  Pt receptive to conversation and verbalized understanding.  Pt returned to room in same manner as above, with 1 short standing rest break and positioned seated EOB with call bell in reach and needs met.   Therapy Documentation Precautions:  Precautions Precautions: Fall, Back Precaution Booklet Issued: No Precaution Comments: Patient able to state precautions. Required Braces or Orthoses: Spinal Brace Spinal Brace: Thoracolumbosacral orthotic Restrictions Weight Bearing Restrictions: No   See Function Navigator for Current Functional Status.   Therapy/Group: Individual Therapy  Earnest Conroy Penven-Crew 08/28/2016, 2:14 PM

## 2016-08-28 NOTE — Progress Notes (Signed)
Patient ID: [name] Small, female   DOB: 07/20/1991, 25 y.o.   MRN: [id number]  [facility] Progress Note    Subjective:   No new complaints   Objective:   BP (!) 152/106 (BP Location: Left Arm) Comment: nurse notified  Pulse (!) 55   Temp 97.9 F (36.6 C) (Oral)   Resp 18   Ht 5\' 3"  (1.6 m)   Wt 53.1 kg (117 lb 1 oz)   SpO2 100%   BMI 20.74 kg/m   Intake/Output: I/O last 3 completed shifts: In: 720 [P.O.:720] Out: -    Intake/Output this shift:  Total I/O In: 120 [P.O.:120] Out: -  Weight change: -0.5 kg (-1 lb 1.6 oz)  Physical Exam: Gen: NAD CVS:no rub Resp:cta HWT:UUEKCM Ext:no edema. Left fem tunneled HD cath without evidence of drainage or erythema  Labs: BMET  Recent Labs Lab 08/23/16 1536 08/25/16 1924  NA 137 138  K 4.5 4.6  CL 99* 103  CO2 23 20*  GLUCOSE 82 94  BUN 51* 60*  CREATININE 6.24* 5.87*  ALBUMIN 3.0* 2.8*  CALCIUM 7.8* 9.0  PHOS 3.9 4.3   CBC  Recent Labs Lab 08/23/16 1536 08/25/16 1923  WBC 7.6 8.2  HGB 8.3* 8.5*  HCT 27.3* 28.1*  MCV 88.3 89.2  PLT 299 288    @IMGRELPRIORS @ Medications:    . calcitonin (salmon)  1 spray Alternating Nares Daily  . calcitRIOL  1 mcg Oral Q M,W,F-HD  . cinacalcet  90 mg Oral Q supper  . darbepoetin (ARANESP) injection - DIALYSIS  200 mcg Intravenous Q Wed-HD  . diphenhydrAMINE  25 mg Intravenous Q M,W,F-HD  . feeding supplement (NEPRO CARB STEADY)  237 mL Oral BID BM  . ferric gluconate (FERRLECIT/NULECIT) IV  250 mg Intravenous Q M,W,F-HD  . lidocaine  1 patch Transdermal Q24H  . multivitamin  1 tablet Oral QHS  . naproxen  500 mg Oral BID WC  . sertraline  100 mg Oral Daily  . sevelamer carbonate  2,400 mg Oral TID WC    Assessment/ Plan:   1. Vertebral fractures- cont with brace and inpatient rehab PT/OT 2. ESRD contin with HD qMWF 3. 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 Nutrition:renal  diet 6. Hypertension:- stable 7. Vascular access- for AVG per Dr. Arbie Cookey this week prior to discharge  Irena Cords, MD [facility], [facility] Pager [phone] 08/28/2016, 2:30 PM

## 2016-08-28 NOTE — Progress Notes (Signed)
[place] PHYSICAL MEDICINE & REHABILITATION     PROGRESS NOTE    Subjective/Complaints: Pt seen sitting up at the edge of her bed this AM.  She slept well overnight and had a good weekend.  She has questions about access placement.   ROS: Denies CP, SOB, N/V/D.  Objective: Vital Signs: Blood pressure (!) 152/106, pulse (!) 55, temperature 97.9 F (36.6 C), temperature source Oral, resp. rate 18, height 5\' 3"  (1.6 m), weight 53.1 kg (117 lb 1 oz), SpO2 100 %. No results found.  Recent Labs  08/25/16 1923  WBC 8.2  HGB 8.5*  HCT 28.1*  PLT 288    Recent Labs  08/25/16 1924  NA 138  K 4.6  CL 103  GLUCOSE 94  BUN 60*  CREATININE 5.87*  CALCIUM 9.0   CBG (last 3)  No results for input(s): GLUCAP in the last 72 hours.  Wt Readings from Last 3 Encounters:  08/28/16 53.1 kg (117 lb 1 oz)  08/18/16 50.8 kg (111 lb 15.9 oz)  08/04/16 51.2 kg (112 lb 14 oz)    Physical Exam:  Constitutional: She appears well-developed. Small stature. NAD. HENT: Normocephalic and atraumatic.  Eyes: EOMI.  No draingage  Cardiovascular: RRR. No JVD. Respiratory: No respiratory distress. She has no wheezes. She has no rales. She exhibits no tenderness  GI: BS+. She exhibits no distension. There is no tenderness. There is no rebound.  Musculoskeletal: She exhibits no edema. No TTP. Neurological: She is alert and oriented  Strength 4+/5 UE prox to distal.  B/l LE 4-4+/5 HF, KE, ankle dorsi/plantar flexion 4+/5 (unchanged) Skin: No rash noted. No erythema. Warm and dry.  Assessment/Plan: 1. Functional and mobility deficits  secondary to thoracic compression fractures which require 3+ hours per day of interdisciplinary therapy in a comprehensive inpatient rehab setting. Physiatrist is providing close team supervision and 24 hour management of active medical problems listed below. Physiatrist and rehab team continue to assess barriers to discharge/monitor patient progress toward  functional and medical goals.  Function:  Bathing Bathing position   Position: Wheelchair/chair at sink  Bathing parts Body parts bathed by patient: Right arm, Left arm, Chest, Abdomen, Right upper leg, Left upper leg, Front perineal area, Buttocks, Right lower leg, Left lower leg, Back Body parts bathed by helper: Right lower leg, Left lower leg  Bathing assist Assist Level: Touching or steadying assistance(Pt > 75%)      Upper Body Dressing/Undressing Upper body dressing   What is the patient wearing?: Pull over shirt/dress, Orthosis     Pull over shirt/dress - Perfomed by patient: Thread/unthread right sleeve, Thread/unthread left sleeve, Put head through opening, Pull shirt over trunk       Orthosis activity level: Performed by patient  Upper body assist Assist Level: Set up   Set up : To obtain clothing/put away  Lower Body Dressing/Undressing Lower body dressing   What is the patient wearing?: Underwear, Pants, Shoes Underwear - Performed by patient: Thread/unthread right underwear leg, Thread/unthread left underwear leg, Pull underwear up/down   Pants- Performed by patient: Thread/unthread right pants leg, Thread/unthread left pants leg, Pull pants up/down   Non-skid slipper socks- Performed by patient: Don/doff right sock, Don/doff left sock   Socks - Performed by patient: Don/doff right sock, Don/doff left sock   Shoes - Performed by patient: Don/doff right shoe, Don/doff left shoe            Lower body assist Assist for lower body dressing: Touching or  steadying assistance (Pt > 75%)      Toileting Toileting Toileting activity did not occur: No continent bowel/bladder event Toileting steps completed by patient: Adjust clothing prior to toileting, Performs perineal hygiene, Adjust clothing after toileting   Toileting Assistive Devices: Grab bar or rail  Toileting assist Assist level: Touching or steadying assistance (Pt.75%)   Transfers Chair/bed transfer    Chair/bed transfer method: Stand pivot Chair/bed transfer assist level: Touching or steadying assistance (Pt > 75%) Chair/bed transfer assistive device: Walker, Orthosis     Locomotion Ambulation     Max distance: 60 Assist level: Touching or steadying assistance (Pt > 75%)   Wheelchair   Type: Manual Max wheelchair distance: 125 ft Assist Level: Supervision or verbal cues  Cognition Comprehension Comprehension assist level: Follows complex conversation/direction with no assist  Expression Expression assist level: Expresses complex ideas: With no assist  Social Interaction Social Interaction assist level: Interacts appropriately with others with medication or extra time (anti-anxiety, antidepressant). (zoloft daily)  Problem Solving Problem solving assist level: Solves complex problems: Recognizes & self-corrects  Memory Memory assist level: Complete Independence: No helper   Medical Problem List and Plan: 1.  Weakness and functional deficits due to thoracic compression fractures, T7-T8 secondary to recent fall and recent history of pathological pelvic fracture  Cont CIR  -Back brace when out of bed  -orthostatic negative             -continue therapies 2.  DVT Prophylaxis/Anticoagulation:   Vascular study showing chronic DVT  Pt seen by Heme/Onc (appreciate recs), no need for anticoagulation at this time.  Further per Nephro, due to pt's noncompliance, at risk for bleed.   3. Pain Management with a history of narcotic abuse:   Naprosyn 500 mg BID  Oxycodone PRN, changed to 10mg  q4 PRN  Lidoderm patch added 11/3, improved control   Robaxin as needed  Dilaudid for more severe pain, d/ced on 10/30  D/ced fentanyl 11/1 4. Mood: Zoloft 100 mg daily. Appreciate Neuropsych following.   5. Neuropsych: This patient is capable of making decisions on her own behalf. 6. Skin/Wound Care: Routine skin checks 7. 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 Acute on chronic anemia.   Hb 8.5 on 11/3 (improving)  Hemocult stools positive  Cont to monitor 11. Medical noncompliance. Counseling 12. Severe Osteoporosis  With pathological fractures  Cont current treatment 13. Labile BP  Likely related to HD, recs per Nephro   LOS (Days) 10 A FACE TO FACE EVALUATION WAS PERFORMED  Corin Tilly Karis Jubanil Lashika Erker 08/28/2016 8:16 AM

## 2016-08-28 NOTE — Progress Notes (Addendum)
Physical Therapy Session Note  Patient Details  Name: [name] Small MRN: [id number] Date of Birth: 01-03-1991  Today's Date: 08/28/2016 PT Individual Time: 1132-1202 PT Individual Time Calculation (min): 30 min    Short Term Goals: Week 2:  PT Short Term Goal 1 (Week 2): = LTG with D/C end of week 11/11  Skilled Therapeutic Interventions/Progress Updates:  Pt received EOB; pt requesting to walk down to [place] to order a sandwich for lunch.  Discussed with PA safe options for renal diet.  Pt performed sit > stand and ambulation in controlled and community environments, on/off elevators >300' with rollator and supervision.  Once at [facility] pt safely parked rollator against wall and sat on rollator with brakes locked to rest and while looking at all sub options and sodium content in order to determine best choices for her renal diet.  Pt able to ambulate through food line and stand without UE support to pay for food and perform multiple squats to retrieve and place items in rollator basket with supervision overall.  Recommended that pt have discussion with nephrology or dietician about specific restrictions and how to make better choices at her favorite restaurants.  Returned to room without rest break with supervision and pt left seated EOB with all items within reach to eat her sub.    Therapy Documentation Precautions:  Precautions Precautions: Fall, Back Precaution Booklet Issued: No Precaution Comments: Patient able to state precautions. Required Braces or Orthoses: Spinal Brace Spinal Brace: Thoracolumbosacral orthotic Restrictions Weight Bearing Restrictions: No Pain: Pain Assessment Pain Assessment: 0-10 Pain Score: 3   See Function Navigator for Current Functional Status.   Therapy/Group: Individual Therapy  Edman [facility] 08/28/2016, 12:08 PM

## 2016-08-28 NOTE — Progress Notes (Signed)
Occupational Therapy Session Note  Patient Details  Name: [name] Small MRN: [id number] Date of Birth: 20-Sep-1991  Today's Date: 08/28/2016 OT Individual Time: 3754-3606 OT Individual Time Calculation (min): 87 min     Short Term Goals: Week 1:  OT Short Term Goal 1 (Week 1): Pt will complete LB bathing sit to stand with min assist using AE PRN. OT Short Term Goal 2 (Week 1): Pt will complete LB dressing with min assist sit to stand.  OT Short Term Goal 3 (Week 1): Pt will perform toilet transfers with min assist using the RW and 3:1. OT Short Term Goal 4 (Week 1): Pt will perform RUE AAROM exercises with modified independence in order to increase AROM for grooming and selfcare tasks.   Skilled Therapeutic Interventions/Progress Updates:     Upon entering the room, pt seated on EOB with 3/10 c/o pain in back but agreeable to OT intervention. Medication given by RN this session. Pt ambulated with rollator and supervision into apartment. Pt planning cooking task for homemade macaroni and cheese. Pt verbalizing items needed and pt utilizing rollator in kitchen in order to open cabinets, look in refrigerator, and locate items needed. Pt making list of items needed for purchase. Pt continued ambulating to dayroom with supervision and 1 standing rest break. Pt utilizing snack machine and sitting on rollator chair with cues in order to maintain precautions to obtain item from machine. Pt ambulating back to room at end of session. Call bell and all needed items within reach. Pt taking rest breaks as needed secondary to fatigue.   Therapy Documentation Precautions:  Precautions Precautions: Fall, Back Precaution Booklet Issued: No Precaution Comments: Patient able to state precautions. Required Braces or Orthoses: Spinal Brace Spinal Brace: Thoracolumbosacral orthotic Restrictions Weight Bearing Restrictions: No  Pain: Pain Assessment Pain Assessment: 0-10 Pain Score: 3  ADL:    Exercises:   Other Treatments:    See Function Navigator for Current Functional Status.   Therapy/Group: Individual Therapy  Lowella Grip 08/28/2016, 2:25 PM

## 2016-08-29 ENCOUNTER — Inpatient Hospital Stay (HOSPITAL_COMMUNITY): Payer: Medicare Other

## 2016-08-29 ENCOUNTER — Inpatient Hospital Stay (HOSPITAL_COMMUNITY): Payer: Medicare Other | Admitting: Occupational Therapy

## 2016-08-29 ENCOUNTER — Other Ambulatory Visit: Payer: Self-pay

## 2016-08-29 NOTE — Progress Notes (Signed)
[place] PHYSICAL MEDICINE & REHABILITATION     PROGRESS NOTE    Subjective/Complaints: Pt ambulating back from the restroom this AM.  She slept well overnight.  She still has not heard from Dr. Arbie CookeyEarly regarding access.    ROS: Denies CP, SOB, N/V/D.  Objective: Vital Signs: Blood pressure (!) 122/95, pulse 93, temperature 97.5 F (36.4 C), temperature source Oral, resp. rate 18, height 5\' 3"  (1.6 m), weight 51.7 kg (114 lb), SpO2 99 %. No results found.  Recent Labs  08/28/16 1502  WBC 10.0  HGB 9.2*  HCT 29.4*  PLT 342    Recent Labs  08/28/16 1502  NA 136  K 5.3*  CL 98*  GLUCOSE 95  BUN 77*  CREATININE 7.59*  CALCIUM 9.4   CBG (last 3)  No results for input(s): GLUCAP in the last 72 hours.  Wt Readings from Last 3 Encounters:  08/29/16 51.7 kg (114 lb)  08/18/16 50.8 kg (111 lb 15.9 oz)  08/04/16 51.2 kg (112 lb 14 oz)    Physical Exam:  Constitutional: She appears well-developed. Small stature. NAD. HENT: Normocephalic and atraumatic.  Eyes: EOMI.  No draingage  Cardiovascular: RRR. No JVD. Respiratory: No respiratory distress. She has no wheezes. She has no rales. She exhibits no tenderness  GI: BS+. She exhibits no distension. There is no tenderness. There is no rebound.  Musculoskeletal: She exhibits no edema. No TTP. Neurological: She is alert and oriented  Strength 4+/5 UE prox to distal.  B/l LE 4/5 HF, KE, ankle dorsi/plantar flexion 4+/5  Skin: No rash noted. No erythema. Warm and dry.  Assessment/Plan: 1. Functional and mobility deficits  secondary to thoracic compression fractures which require 3+ hours per day of interdisciplinary therapy in a comprehensive inpatient rehab setting. Physiatrist is providing close team supervision and 24 hour management of active medical problems listed below. Physiatrist and rehab team continue to assess barriers to discharge/monitor patient progress toward functional and medical  goals.  Function:  Bathing Bathing position   Position: Wheelchair/chair at sink  Bathing parts Body parts bathed by patient: Right arm, Left arm, Chest, Abdomen, Right upper leg, Left upper leg, Front perineal area, Buttocks, Right lower leg, Left lower leg, Back Body parts bathed by helper: Right lower leg, Left lower leg  Bathing assist Assist Level: Touching or steadying assistance(Pt > 75%)      Upper Body Dressing/Undressing Upper body dressing   What is the patient wearing?: Pull over shirt/dress, Orthosis     Pull over shirt/dress - Perfomed by patient: Thread/unthread right sleeve, Thread/unthread left sleeve, Put head through opening, Pull shirt over trunk       Orthosis activity level: Performed by patient  Upper body assist Assist Level: Set up   Set up : To obtain clothing/put away  Lower Body Dressing/Undressing Lower body dressing   What is the patient wearing?: Underwear, Pants, Shoes Underwear - Performed by patient: Thread/unthread right underwear leg, Thread/unthread left underwear leg, Pull underwear up/down   Pants- Performed by patient: Thread/unthread right pants leg, Thread/unthread left pants leg, Pull pants up/down   Non-skid slipper socks- Performed by patient: Don/doff right sock, Don/doff left sock   Socks - Performed by patient: Don/doff right sock, Don/doff left sock   Shoes - Performed by patient: Don/doff right shoe, Don/doff left shoe            Lower body assist Assist for lower body dressing: Touching or steadying assistance (Pt > 75%)  Toileting Toileting Toileting activity did not occur: No continent bowel/bladder event Toileting steps completed by patient: Adjust clothing prior to toileting, Performs perineal hygiene, Adjust clothing after toileting   Toileting Assistive Devices: Grab bar or rail  Toileting assist Assist level: Touching or steadying assistance (Pt.75%)   Transfers Chair/bed transfer   Chair/bed transfer  method: Ambulatory Chair/bed transfer assist level: Supervision or verbal cues Chair/bed transfer assistive device: Armrests, Environmental consultant, Orthosis     Locomotion Ambulation     Max distance: 200 Assist level: Supervision or verbal cues   Wheelchair   Type: Manual Max wheelchair distance: 125 ft Assist Level: Supervision or verbal cues  Cognition Comprehension Comprehension assist level: Follows complex conversation/direction with no assist  Expression Expression assist level: Expresses complex ideas: With no assist  Social Interaction Social Interaction assist level: Interacts appropriately with others with medication or extra time (anti-anxiety, antidepressant).  Problem Solving Problem solving assist level: Solves complex problems: Recognizes & self-corrects  Memory Memory assist level: Complete Independence: No helper, Recognizes or recalls 50 - 74% of the time/requires cueing 25 - 49% of the time   Medical Problem List and Plan: 1.  Weakness and functional deficits due to thoracic compression fractures, T7-T8 secondary to recent fall and recent history of pathological pelvic fracture  Cont CIR  -Back brace when out of bed  -orthostatic negative             -continue therapies 2.  DVT Prophylaxis/Anticoagulation:   Vascular study showing chronic DVT  Pt seen by Heme/Onc (appreciate recs), no need for anticoagulation at this time.  Further per Nephro, due to pt's noncompliance, at risk for bleed.   3. Pain Management with a history of narcotic abuse:   Naprosyn 500 mg BID  Oxycodone PRN, changed to 10mg  q4 PRN  Lidoderm patch added 11/3  Controlled on 11/7  Robaxin as needed  Dilaudid for more severe pain, d/ced on 10/30  D/ced fentanyl 11/1 4. Mood: Zoloft 100 mg daily. Appreciate Neuropsych following.   5. Neuropsych: This patient is capable of making decisions on her own behalf. 6. Skin/Wound Care: Routine skin checks 7. Fluids/Electrolytes/Nutrition: encourage PO 8.ESRD  due to childhood polycystic kidney disease with history of noncompliance to dialysis.  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 Acute on chronic anemia.   Hb 9.2 on 11/6 (improving)  Hemocult stools positive  Cont to monitor 11. Medical noncompliance. Counseling 12. Severe Osteoporosis  With pathological fractures  Cont current treatment 13. Labile BP  Likely related to HD, recs per Nephro   LOS (Days) 11 A FACE TO FACE EVALUATION WAS PERFORMED  Ersie Savino Karis Juba 08/29/2016 8:27 AM

## 2016-08-29 NOTE — Progress Notes (Signed)
Physical Therapy Session Note  Patient Details  Name: [name] Small MRN: [id number] Date of Birth: 1990/11/14  Today's Date: 08/29/2016 PT Individual Time: 0900-1000 PT Individual Time Calculation (min): 60 min     Skilled Therapeutic Interventions/Progress Updates:    Focused on addressing neuro re-ed for balance re-training using various balance games on the Wii and Wii Fit including tennis on compliant surface and Table Tilt, snowboarding, and bubble maze on the Wii Fit to address weightshifting, balance strategies, and postural control. Pt completed at overall close supervision to occasional steady assist when not using UE support on my dynamic tasks.   Pt able to gait with rollator to/from therapy session at overall supervision level with intermittent cues for upright posture. Demonstrates safe use with rollator during transfers, locking brakes appropriately.  Stair negotiation training on 6" steps x 4 steps with bilateral rails with min assist. Demonstrated technique with single rail to patient as this simulates her entry to her parents house. Recommended to trial this prior to d/c at end of week and patient in agreement. Pt denies concerns in regards to upcoming d/c at end of the week.  Set up patient with resource for a craft activity to do in her room and pt very excited about this at end of session.   Therapy Documentation Precautions:  Precautions Precautions: Fall, Back Precaution Booklet Issued: No Precaution Comments: Patient able to state precautions. Required Braces or Orthoses: Spinal Brace Spinal Brace: Thoracolumbosacral orthotic Restrictions Weight Bearing Restrictions: No  Pain: C/o 7/10 pain in back and pelvis - medication given at start of session    See Function Navigator for Current Functional Status.   Therapy/Group: Individual Therapy  Karolee Stamps Darrol Poke, PT, DPT  08/29/2016, 11:24 AM

## 2016-08-29 NOTE — Plan of Care (Signed)
Problem: RH PAIN MANAGEMENT Goal: RH STG PAIN MANAGED AT OR BELOW PT'S PAIN GOAL <3 on pain scale  Outcome: Not Progressing Frequent pain complaints

## 2016-08-29 NOTE — Progress Notes (Signed)
Occupational Therapy Session Note  Patient Details  Name: [name] Small MRN: [id number] Date of Birth: 06-19-1991  Today's Date: 08/29/2016 OT Individual Time: 1045-1200 and 1435-1601 OT Individual Time Calculation (min): 75 min and 86 min     Short Term Goals: Week 2:  OT Short Term Goal 1 (Week 2): STGs=LTGs due to ELOS  Skilled Therapeutic Interventions/Progress Updates:    Session 1: Upon entering the room, pt seated on EOB awaiting therapist with pain in her spine with 6/10 pain but currently unable to get further medication at this time. Pt agreeable to OT intervention and very motivated for this session. OT discussed plan for afternoon session in regards to meal prep with pt verbalizing understanding. Pt requesting to go to [employer] to purchase lunch. Skilled OT session with focus on community mobility, use of rollator in community type setting, safety, endurance, and pt education. Pt able to verbalize knowledge of good food options based on need for low sodium diet. Pt ambulating onto elevator and down to restaurant with supervision and use of rollator. Pt standing in line and making options and sitting on rollator seat as she fatigue. Pt sitting at regular table and chair without armrest to eat while therapist discussed progress towards goals. Pt standing from seat with supervision and returning to room in same manner. Pt seated on EOB at end of session with call bell and all needed items within reach.   Session 2: Upon entering the room, pt seated on EOB with 4/10 c/o pain but agreeable to OT intervention. Pt ambulated with rollator into ADL apartment for simple meal prep task. OT educating pt on use of rollator to obtain items from cabinets, refrigerator, and to transport items safety. Pt returned demonstration with min verbal cues. Pt cooking a traditional thanksgiving dish from her own recipe and obtained items with supervision and increased time. Pt utilizing rollator seat and counter  top to transport items from one location to the next. Pt safely adjusted oven temp and cleaned. Pt remained standing for 35 minutes until taking seated rest break. Pt returned to room at end of session with supervision. Call bell and all needed items within reach upon exiting the room.   Therapy Documentation Precautions:  Precautions Precautions: Fall, Back Precaution Booklet Issued: No Precaution Comments: Patient able to state precautions. Required Braces or Orthoses: Spinal Brace Spinal Brace: Thoracolumbosacral orthotic Restrictions Weight Bearing Restrictions: No  Pain: Pain Assessment Pain Assessment: 0-10 Pain Score: 4  Pain Type: Acute pain Pain Location: Back Pain Orientation: Mid;Lower Pain Descriptors / Indicators: Penetrating;Cramping Pain Frequency: Constant Pain Onset: On-going Patients Stated Pain Goal: 4 Pain Intervention(s): Medication (See eMAR)  See Function Navigator for Current Functional Status.   Therapy/Group: Individual Therapy  Lowella Grip 08/29/2016, 12:40 PM

## 2016-08-30 ENCOUNTER — Inpatient Hospital Stay (HOSPITAL_COMMUNITY): Payer: Medicare Other | Admitting: Physical Therapy

## 2016-08-30 ENCOUNTER — Inpatient Hospital Stay (HOSPITAL_COMMUNITY): Payer: Medicare Other | Admitting: Occupational Therapy

## 2016-08-30 LAB — MRSA PCR SCREENING: MRSA by PCR: NEGATIVE

## 2016-08-30 MED ORDER — DARBEPOETIN ALFA 200 MCG/0.4ML IJ SOSY
PREFILLED_SYRINGE | INTRAMUSCULAR | Status: AC
  Administered 2016-08-30: 200 ug via INTRAVENOUS
  Filled 2016-08-30: qty 0.4

## 2016-08-30 MED ORDER — LANTHANUM CARBONATE 500 MG PO CHEW
1000.0000 mg | CHEWABLE_TABLET | Freq: Three times a day (TID) | ORAL | Status: DC
Start: 2016-08-30 — End: 2016-09-02
  Administered 2016-08-31 – 2016-09-01 (×4): 1000 mg via ORAL
  Filled 2016-08-30 (×4): qty 2

## 2016-08-30 MED ORDER — DIPHENHYDRAMINE HCL 50 MG/ML IJ SOLN
INTRAMUSCULAR | Status: AC
Start: 2016-08-30 — End: 2016-08-30
  Administered 2016-08-30: 25 mg via INTRAVENOUS
  Filled 2016-08-30: qty 1

## 2016-08-30 NOTE — Anesthesia Preprocedure Evaluation (Addendum)
Anesthesia Evaluation  Patient identified by MRN, date of birth, ID band Patient awake    Reviewed: Allergy & Precautions, NPO status , Patient's Chart, lab work & pertinent test results, reviewed documented beta blocker date and time   History of Anesthesia Complications Negative for: history of anesthetic complications  Airway Mallampati: II  TM Distance: >3 FB Neck ROM: Full    Dental  (+) Dental Advisory Given   Pulmonary former smoker, PE   breath sounds clear to auscultation       Cardiovascular hypertension, Pt. on medications and Pt. on home beta blockers (-) angina+CHF and + DVT  + Cardiac Defibrillator + Valvular Problems/Murmurs MR  Rhythm:Regular Rate:Normal  8/17 ECHO:  EF 20-25%, mod MR   Neuro/Psych Anxiety Depression negative neurological ROS     GI/Hepatic Neg liver ROS, GERD  Medicated and Controlled,  Endo/Other  negative endocrine ROS  Renal/GU ESRF and DialysisRenal disease (K+ 4.1)     Musculoskeletal   Abdominal   Peds  Hematology  (+) Sickle cell anemia ,   Anesthesia Other Findings   Reproductive/Obstetrics                            Anesthesia Physical Anesthesia Plan  ASA: III  Anesthesia Plan: General   Post-op Pain Management:    Induction: Intravenous  Airway Management Planned: LMA  Additional Equipment:   Intra-op Plan:   Post-operative Plan:   Informed Consent: I have reviewed the patients History and Physical, chart, labs and discussed the procedure including the risks, benefits and alternatives for the proposed anesthesia with the patient or authorized representative who has indicated his/her understanding and acceptance.   Dental advisory given  Plan Discussed with: CRNA and Surgeon  Anesthesia Plan Comments: (Plan routine monitors, GA- LMA OK)        Anesthesia Quick Evaluation

## 2016-08-30 NOTE — Progress Notes (Signed)
Physical Therapy Session Note  Patient Details  Name: [name] Small MRN: [id number] Date of Birth: [birth year]  Today's Date: 08/30/2016 PT Individual Time: 1130-1200 PT Individual Time Calculation (min): 30 min    Short Term Goals: Week 2:  PT Short Term Goal 1 (Week 2): = LTG with D/C end of week 11/11  Skilled Therapeutic Interventions/Progress Updates:    Pt resting sitting EOB on arrival, requesting to work on activity tolerance and gait training during session.  No c/o pain except in bilat heels with ambulation.  Pt amb with rollator to [place] to order lunch and back up to room with 1 seated rest break.  Min verbal cues provided throughout for relaxing shoulders and upright posture.  Pt left seated EOB with call bell in reach and needs met.   Therapy Documentation Precautions:  Precautions Precautions: Fall, Back Precaution Booklet Issued: No Precaution Comments: Patient able to state precautions. Required Braces or Orthoses: Spinal Brace Spinal Brace: Thoracolumbosacral orthotic Restrictions Weight Bearing Restrictions: No   See Function Navigator for Current Functional Status.   Therapy/Group: Individual Therapy  Earnest Conroy Penven-Crew 08/30/2016, 12:04 PM

## 2016-08-30 NOTE — Progress Notes (Signed)
Physical Therapy Session Note  Patient Details  Name: [name] Small MRN: [id number] Date of Birth: [birth year]  Today's Date: 08/30/2016 PT Individual Time: 0805-0901 PT Individual Time Calculation (min): 56 min    Short Term Goals: Week 1:  PT Short Term Goal 1 (Week 1): Patient will be able to complete supine to sit transfer with supervision PT Short Term Goal 1 - Progress (Week 1): Progressing toward goal PT Short Term Goal 2 (Week 1): Patient will be able to perform bed to w/c transfer with Supervision PT Short Term Goal 2 - Progress (Week 1): Progressing toward goal PT Short Term Goal 3 (Week 1): Patient will be able to  cover the distance of 75 feet with RW with Supervision PT Short Term Goal 3 - Progress (Week 1): Met PT Short Term Goal 4 (Week 1): Patient will be able to perform car transfer with Supervision to regular height car and min A for SUV PT Short Term Goal 4 - Progress (Week 1): Progressing toward goal PT Short Term Goal 5 (Week 1): Patient will be able to perform standing activities for 5 min w/o rest break and w/o LOB, no UE support PT Short Term Goal 5 - Progress (Week 1): Progressing toward goal Week 2:  PT Short Term Goal 1 (Week 2): = LTG with D/C end of week 11/11  Skilled Therapeutic Interventions/Progress Updates:     Patient received sitting EOB eating breakfast, and requested extra timet to finish eating. PT returned after 5 minutes and pt agreeable to PT. Patient donned back brace sitting EOB  Gait in hall x 136f to rehab gym with rollator PT provided supervision Assist for safety. Pt noted to require 2 standing rest breaks due to fatigue in BLE and pain in back.  Patient required extended rest break following gait training.   PT instructed patient in standing balance while playing Wii sports baseball and tennis with intermittent 1 UE and no UE support. Wii boweling on uneven surface for 6 frames and on level surface for last 4 frames. PT provided close  supervision assist throughout balance training.   Gait training back to room with rollator x 75 ft [facility] Patient left sitting EOB with call bel lin reach. .     Therapy Documentation Precautions:  Precautions Precautions: Fall, Back Precaution Booklet Issued: No Precaution Comments: Patient able to state precautions. Required Braces or Orthoses: Spinal Brace Spinal Brace: Thoracolumbosacral orthotic Restrictions Weight Bearing Restrictions: No General:   Vital Signs: Therapy Vitals Temp: 97.7 F (36.5 C) Temp Source: Oral Pulse Rate: 90 Resp: 17 BP: (!) 131/92 Patient Position (if appropriate): Lying Oxygen Therapy SpO2: 100 % O2 Device: Not Delivered   See Function Navigator for Current Functional Status.   Therapy/Group: Individual Therapy  ALorie Phenix11/05/2016, 9:04 AM

## 2016-08-30 NOTE — Progress Notes (Signed)
Physical Therapy Session Note  Patient Details  Name: [name] Small MRN: [id number] Date of Birth: [birth year]  Today's Date: 08/30/2016 PT Individual Time: 1415-1445 PT Individual Time Calculation (min): 30 min    Short Term Goals: Week 2:  PT Short Term Goal 1 (Week 2): = LTG with D/C end of week 11/11  Skilled Therapeutic Interventions/Progress Updates:    Pt resting EOB on arrival, c/o increase pain at 7/10 but agreeable to therapy as RN planning to administered pain medication at 1445.  Pt ambulated to and from therapy gym for activity tolerance with supervision and verbal cues for relaxed shoulders.  PT provided pt with HEP to be performed 3 or more times each week for LAQ, seated marching, isometric hip adduction, and heel/toe raises.  Pt reviewed handout with therapist and performed each exercise 5 reps for demonstration of understanding.  Pt asked appropriate questions.  Pt returned to room in same manner as above and positioned EOB with call bell in reach and needs met.   Therapy Documentation Precautions:  Precautions Precautions: Fall, Back Precaution Booklet Issued: No Precaution Comments: Patient able to state precautions. Required Braces or Orthoses: Spinal Brace Spinal Brace: Thoracolumbosacral orthotic Restrictions Weight Bearing Restrictions: No   See Function Navigator for Current Functional Status.   Therapy/Group: Individual Therapy  Earnest Conroy Penven-Crew 08/30/2016, 3:45 PM

## 2016-08-30 NOTE — Progress Notes (Signed)
Patient ID: [name] Small, female   DOB: 09/15/1991, 25 y.o.   MRN: [id number] Doing well on the rehabilitation floor. In therapy this morning. Discussed plan for right femoral loop graft with Dr. Randie Heinz tomorrow at 7:30. Can return to rehabilitation floor following the procedure.

## 2016-08-30 NOTE — Progress Notes (Signed)
Patient ID: [name] Small, female   DOB: 04-05-1991, 25 y.o.   MRN: [id number]  [facility] Progress Note    Subjective:   Doing well no complaints   Objective:   BP (!) 131/92 (BP Location: Left Arm)   Pulse 90   Temp 97.7 F (36.5 C) (Oral)   Resp 17   Ht 5\' 3"  (1.6 m)   Wt 52.3 kg (115 lb 4.8 oz)   SpO2 100%   BMI 20.42 kg/m   Intake/Output: I/O last 3 completed shifts: In: 700 [P.O.:700] Out: -    Intake/Output this shift:  Total I/O In: 360 [P.O.:360] Out: -  Weight change: 1.2 kg (2 lb 10.3 oz)  Physical Exam: Gen: WD WN AAF in NAD CVS:no rub Resp:cta WNI:OEVOJJ Ext:1+ edema lower ext and uclerations on left calf  Labs: BMET  Recent Labs Lab 08/23/16 1536 08/25/16 1924 08/28/16 1502  NA 137 138 136  K 4.5 4.6 5.3*  CL 99* 103 98*  CO2 23 20* 23  GLUCOSE 82 94 95  BUN 51* 60* 77*  CREATININE 6.24* 5.87* 7.59*  ALBUMIN 3.0* 2.8* 3.3*  CALCIUM 7.8* 9.0 9.4  PHOS 3.9 4.3 5.8*   CBC  Recent Labs Lab 08/23/16 1536 08/25/16 1923 08/28/16 1502  WBC 7.6 8.2 10.0  HGB 8.3* 8.5* 9.2*  HCT 27.3* 28.1* 29.4*  MCV 88.3 89.2 89.9  PLT 299 288 342    @IMGRELPRIORS @ Medications:    . calcitonin (salmon)  1 spray Alternating Nares Daily  . calcitRIOL  1 mcg Oral Q M,W,F-HD  . cinacalcet  90 mg Oral Q supper  . darbepoetin (ARANESP) injection - DIALYSIS  200 mcg Intravenous Q Wed-HD  . diphenhydrAMINE  25 mg Intravenous Q M,W,F-HD  . feeding supplement (NEPRO CARB STEADY)  237 mL Oral BID BM  . lidocaine  1 patch Transdermal Q24H  . multivitamin  1 tablet Oral QHS  . naproxen  500 mg Oral BID WC  . sertraline  100 mg Oral Daily  . sevelamer carbonate  2,400 mg Oral TID WC   Dialysis Orders: Center: [facility] on MWF . EDW 51kg  HD Bath 2k, 2ca  Time 4hr Heparin 2000. Access L fem perm cath      Hec 1 mcg IV/HD no esa or fe    New RUE AVG 08/01/16; removal 10/12 due to steal symptoms. L Fem Perm cath  Assessment/ Plan:    1. Vertebral fractures- cont with brace and inpatient rehab PT/OT.  Discussed with patient that her poorly controlled SHPTH played a role due to her iPTH levels of >2000 and stressed the importance of phosphorus control. 2. ESRD contin with HD qMWF.  Will challenge edw due to edema. 3. 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  Renal diet and change renvela to fosrenol and follow.  4. 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 Nutrition:renal diet 7. Hypertension:- stable 8. Vascular access- for AVG per Dr. Arbie Cookey tomorrow morning.    Irena Cords, MD [facility], [facility] Pager [phone] 08/30/2016, 2:11 PM

## 2016-08-30 NOTE — Progress Notes (Signed)
Social Work Patient ID: [name] Small, female   DOB: 1991-08-12, 25 y.o.   MRN: [id number]  Met with pt to discuss team conference progress toward her goals and discharge 11/11. She reports both her Mom and Dad will be taking turns being there for her at discharge. Aware of surgery tomorrow and will probably not be up to therapy. Will try to get parent here for Family education prior to discharge. Pt reports they have been here and seen her in therapies.  Will work on discharge needs and follow up.

## 2016-08-30 NOTE — Progress Notes (Signed)
Occupational Therapy Session Note  Patient Details  Name: Trejure Bergamo MRN: [id number] Date of Birth: 09/21/1991  Today's Date: 08/30/2016 OT Individual Time: 0301-3143 OT Individual Time Calculation (min): 73 min     Short Term Goals: Week 2:  OT Short Term Goal 1 (Week 2): STGs=LTGs due to ELOS  Skilled Therapeutic Interventions/Progress Updates:    Upon entering the room, pt seated on toileting having BM and needing min cues to maintain back precautions with hygiene and clothing management. Pt ambulated with rollator to sink for bathing tasks. Pt has all items gathered at sink needed for task. Pt given min cues to bend knees into squat in order to obtain items from low closet. Pt performed bathing with sit <> stand from rollator at sink with supervision. Pt able to don/doff orthosis independently. Pt ambulating from room to ADL apartment with supervision for bed making task. OT demonstrated a variety of ways to complete task with use of rollator and while maintaining precautions. Pt returned demonstrations and made bed with increased time and min verbal cues for proper technique. Pt returning to room and asking for pain medication from RN as she reports 7/10 pain in back. Call bell and all needed items within reach upon exiting the room.   Therapy Documentation Precautions:  Precautions Precautions: Fall, Back Precaution Booklet Issued: No Precaution Comments: Patient able to state precautions. Required Braces or Orthoses: Spinal Brace Spinal Brace: Thoracolumbosacral orthotic Restrictions Weight Bearing Restrictions: No   Pain: Pain Assessment Pain Score: 7   See Function Navigator for Current Functional Status.   Therapy/Group: Individual Therapy  Lowella Grip 08/30/2016, 12:37 PM

## 2016-08-30 NOTE — Progress Notes (Signed)
[place] PHYSICAL MEDICINE & REHABILITATION     PROGRESS NOTE    Subjective/Complaints: Pt laying in bed this AM.  She slept well overnight.  She does not have any complaints at present.  Pain improved.  ROS: Denies CP, SOB, N/V/D.  Objective: Vital Signs: Blood pressure (!) 131/92, pulse 90, temperature 97.7 F (36.5 C), temperature source Oral, resp. rate 17, height 5\' 3"  (1.6 m), weight 52.3 kg (115 lb 4.8 oz), SpO2 100 %. No results found.  Recent Labs  08/28/16 1502  WBC 10.0  HGB 9.2*  HCT 29.4*  PLT 342    Recent Labs  08/28/16 1502  NA 136  K 5.3*  CL 98*  GLUCOSE 95  BUN 77*  CREATININE 7.59*  CALCIUM 9.4   CBG (last 3)  No results for input(s): GLUCAP in the last 72 hours.  Wt Readings from Last 3 Encounters:  08/30/16 52.3 kg (115 lb 4.8 oz)  08/18/16 50.8 kg (111 lb 15.9 oz)  08/04/16 51.2 kg (112 lb 14 oz)    Physical Exam:  Constitutional: She appears well-developed. Small stature. NAD. HENT: Normocephalic and atraumatic.  Eyes: EOMI.  No draingage  Cardiovascular: RRR. No JVD. Respiratory: No respiratory distress. She has no wheezes. She has no rales. She exhibits no tenderness  GI: BS+. She exhibits no distension. There is no tenderness. There is no rebound.  Musculoskeletal: She exhibits no edema. No TTP. Neurological: She is alert and oriented  Strength 4+/5 UE prox to distal.  B/l LE 4/5 HF, KE, ankle dorsi/plantar flexion 4+/5 (unchanged) Skin: No rash noted. No erythema. Warm and dry.  Assessment/Plan: 1. Functional and mobility deficits  secondary to thoracic compression fractures which require 3+ hours per day of interdisciplinary therapy in a comprehensive inpatient rehab setting. Physiatrist is providing close team supervision and 24 hour management of active medical problems listed below. Physiatrist and rehab team continue to assess barriers to discharge/monitor patient progress toward functional and medical  goals.  Function:  Bathing Bathing position   Position: Wheelchair/chair at sink  Bathing parts Body parts bathed by patient: Right arm, Left arm, Chest, Abdomen, Right upper leg, Left upper leg, Front perineal area, Buttocks, Right lower leg, Left lower leg, Back Body parts bathed by helper: Right lower leg, Left lower leg  Bathing assist Assist Level: Touching or steadying assistance(Pt > 75%)      Upper Body Dressing/Undressing Upper body dressing   What is the patient wearing?: Pull over shirt/dress, Orthosis     Pull over shirt/dress - Perfomed by patient: Thread/unthread right sleeve, Thread/unthread left sleeve, Put head through opening, Pull shirt over trunk       Orthosis activity level: Performed by patient  Upper body assist Assist Level: Set up   Set up : To obtain clothing/put away  Lower Body Dressing/Undressing Lower body dressing   What is the patient wearing?: Underwear, Pants, Shoes Underwear - Performed by patient: Thread/unthread right underwear leg, Thread/unthread left underwear leg, Pull underwear up/down   Pants- Performed by patient: Thread/unthread right pants leg, Thread/unthread left pants leg, Pull pants up/down   Non-skid slipper socks- Performed by patient: Don/doff right sock, Don/doff left sock   Socks - Performed by patient: Don/doff right sock, Don/doff left sock   Shoes - Performed by patient: Don/doff right shoe, Don/doff left shoe            Lower body assist Assist for lower body dressing: Touching or steadying assistance (Pt > 75%)  Toileting Toileting Toileting activity did not occur: No continent bowel/bladder event Toileting steps completed by patient: Adjust clothing prior to toileting, Performs perineal hygiene, Adjust clothing after toileting   Toileting Assistive Devices: Grab bar or rail  Toileting assist Assist level: Supervision or verbal cues   Transfers Chair/bed transfer   Chair/bed transfer method:  Ambulatory Chair/bed transfer assist level: Supervision or verbal cues Chair/bed transfer assistive device: Armrests, Environmental consultantWalker, Orthosis     Locomotion Ambulation     Max distance: 250' Assist level: Supervision or verbal cues   Wheelchair   Type: Manual Max wheelchair distance: 125 ft Assist Level: Supervision or verbal cues  Cognition Comprehension Comprehension assist level: Follows complex conversation/direction with no assist  Expression Expression assist level: Expresses complex ideas: With no assist  Social Interaction Social Interaction assist level: Interacts appropriately with others with medication or extra time (anti-anxiety, antidepressant).  Problem Solving Problem solving assist level: Solves complex problems: Recognizes & self-corrects  Memory Memory assist level: Complete Independence: No helper, Recognizes or recalls 50 - 74% of the time/requires cueing 25 - 49% of the time   Medical Problem List and Plan: 1.  Weakness and functional deficits due to thoracic compression fractures, T7-T8 secondary to recent fall and recent history of pathological pelvic fracture  Cont CIR  -Back brace when out of bed  -orthostatic negative             -continue therapies 2.  DVT Prophylaxis/Anticoagulation:   Vascular study showing chronic DVT  Pt seen by Heme/Onc (appreciate recs), no need for anticoagulation at this time.  Further per Nephro, due to pt's noncompliance, at risk for bleed.   3. Pain Management with a history of narcotic abuse:   Naprosyn 500 mg BID  Oxycodone PRN, changed to 10mg  q4 PRN  Lidoderm patch added 11/3, d/ced per pt  Improved on 11/8  Robaxin as needed  Dilaudid for more severe pain, d/ced on 10/30  D/ced fentanyl 11/1 4. Mood: Zoloft 100 mg daily. Appreciate Neuropsych following.   5. Neuropsych: This patient is capable of making decisions on her own behalf. 6. Skin/Wound Care: Routine skin checks 7. Fluids/Electrolytes/Nutrition: encourage  PO 8.ESRD due to childhood polycystic kidney disease with history of noncompliance to dialysis.  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 Acute on chronic anemia.   Hb 9.2 on 11/6 (improving)  Hemocult stools positive  Cont to monitor 11. Medical noncompliance. Counseling 12. Severe Osteoporosis  With pathological fractures  Cont current treatment 13. Labile BP  Likely related to HD, recs per Nephro  Stable on 11/7   LOS (Days) 12 A FACE TO FACE EVALUATION WAS PERFORMED  Ernie Sagrero Karis Jubanil Joseangel Nettleton 08/30/2016 7:55 AM

## 2016-08-31 ENCOUNTER — Inpatient Hospital Stay (HOSPITAL_COMMUNITY): Payer: Medicare Other | Admitting: Anesthesiology

## 2016-08-31 ENCOUNTER — Ambulatory Visit (HOSPITAL_COMMUNITY): Admission: RE | Admit: 2016-08-31 | Payer: Medicare Other | Source: Ambulatory Visit | Admitting: Vascular Surgery

## 2016-08-31 ENCOUNTER — Encounter (HOSPITAL_COMMUNITY)
Admission: RE | Disposition: A | Discharge status: Home Health | Payer: Self-pay | Source: Intra-hospital | Attending: Physical Medicine & Rehabilitation

## 2016-08-31 ENCOUNTER — Encounter (HOSPITAL_COMMUNITY): Payer: Self-pay | Admitting: Certified Registered Nurse Anesthetist

## 2016-08-31 DIAGNOSIS — G8918 Other acute postprocedural pain: Secondary | ICD-10-CM

## 2016-08-31 DIAGNOSIS — N186 End stage renal disease: Secondary | ICD-10-CM

## 2016-08-31 HISTORY — PX: AV FISTULA PLACEMENT: SHX1204

## 2016-08-31 LAB — RENAL FUNCTION PANEL
Albumin: 3.3 g/dL — ABNORMAL LOW (ref 3.5–5.0)
Anion gap: 12 (ref 5–15)
BUN: 34 mg/dL — ABNORMAL HIGH (ref 6–20)
CHLORIDE: 98 mmol/L — AB (ref 101–111)
CO2: 25 mmol/L (ref 22–32)
CREATININE: 3.9 mg/dL — AB (ref 0.44–1.00)
Calcium: 9.9 mg/dL (ref 8.9–10.3)
GFR, EST AFRICAN AMERICAN: 17 mL/min — AB (ref >60)
GFR, EST NON AFRICAN AMERICAN: 15 mL/min — AB (ref >60)
Glucose, Bld: 81 mg/dL (ref 65–99)
PHOSPHORUS: 5 mg/dL — AB (ref 2.5–4.6)
Potassium: 4.1 mmol/L (ref 3.5–5.1)
Sodium: 135 mmol/L (ref 135–145)

## 2016-08-31 LAB — PARATHYROID HORMONE, INTACT (NO CA): PTH: 2569 pg/mL — ABNORMAL HIGH (ref 15–65)

## 2016-08-31 SURGERY — INSERTION OF ARTERIOVENOUS (AV) GORE-TEX GRAFT THIGH
Anesthesia: General | Site: Leg Upper | Laterality: Right

## 2016-08-31 MED ORDER — HEPARIN SODIUM (PORCINE) 5000 UNIT/ML IJ SOLN
INTRAMUSCULAR | Status: DC | PRN
  Administered 2016-08-31: 500 mL

## 2016-08-31 MED ORDER — FENTANYL CITRATE (PF) 100 MCG/2ML IJ SOLN
INTRAMUSCULAR | Status: AC
  Filled 2016-08-31: qty 2

## 2016-08-31 MED ORDER — EPHEDRINE SULFATE 50 MG/ML IJ SOLN
INTRAMUSCULAR | Status: DC | PRN
  Administered 2016-08-31 (×2): 5 mg via INTRAVENOUS
  Administered 2016-08-31: 10 mg via INTRAVENOUS

## 2016-08-31 MED ORDER — SODIUM CHLORIDE 0.9 % IV SOLN
INTRAVENOUS | Status: DC | PRN
  Administered 2016-08-31 (×2): via INTRAVENOUS

## 2016-08-31 MED ORDER — HEPARIN SODIUM (PORCINE) 1000 UNIT/ML IJ SOLN
INTRAMUSCULAR | Status: DC | PRN
  Administered 2016-08-31: 5000 [IU] via INTRAVENOUS

## 2016-08-31 MED ORDER — VASOPRESSIN 20 UNIT/ML IV SOLN
20.0000 [IU] | INTRAVENOUS | Status: DC
  Filled 2016-08-31: qty 1

## 2016-08-31 MED ORDER — MEPERIDINE HCL 25 MG/ML IJ SOLN: 6.2500 mg | INTRAMUSCULAR | Status: DC | PRN

## 2016-08-31 MED ORDER — CARVEDILOL 3.125 MG PO TABS
ORAL_TABLET | ORAL | Status: AC
  Administered 2016-08-31: 3.125 mg
  Filled 2016-08-31: qty 1

## 2016-08-31 MED ORDER — PROPOFOL 10 MG/ML IV BOLUS
INTRAVENOUS | Status: AC
  Filled 2016-08-31: qty 20

## 2016-08-31 MED ORDER — PHENYLEPHRINE HCL 10 MG/ML IJ SOLN
INTRAMUSCULAR | Status: DC | PRN
  Administered 2016-08-31 (×4): 80 ug via INTRAVENOUS

## 2016-08-31 MED ORDER — LIDOCAINE HCL (PF) 1 % IJ SOLN
INTRAMUSCULAR | Status: AC
  Filled 2016-08-31: qty 30

## 2016-08-31 MED ORDER — 0.9 % SODIUM CHLORIDE (POUR BTL) OPTIME
TOPICAL | Status: DC | PRN
  Administered 2016-08-31: 1000 mL

## 2016-08-31 MED ORDER — MIDAZOLAM HCL 2 MG/2ML IJ SOLN: 0.5000 mg | Freq: Once | INTRAMUSCULAR | Status: DC | PRN

## 2016-08-31 MED ORDER — PHENYLEPHRINE HCL 10 MG/ML IJ SOLN
INTRAVENOUS | Status: DC | PRN
  Administered 2016-08-31: 20 ug/min via INTRAVENOUS

## 2016-08-31 MED ORDER — ETOMIDATE 2 MG/ML IV SOLN
INTRAVENOUS | Status: AC
  Filled 2016-08-31: qty 10

## 2016-08-31 MED ORDER — HEMOSTATIC AGENTS (NO CHARGE) OPTIME
TOPICAL | Status: DC | PRN
  Administered 2016-08-31: 1 via TOPICAL

## 2016-08-31 MED ORDER — CEFAZOLIN SODIUM-DEXTROSE 2-4 GM/100ML-% IV SOLN
2.0000 g | INTRAVENOUS | Status: AC
  Administered 2016-08-31: 2 g via INTRAVENOUS
  Filled 2016-08-31: qty 100

## 2016-08-31 MED ORDER — GLYCOPYRROLATE 0.2 MG/ML IJ SOLN
INTRAMUSCULAR | Status: DC | PRN
  Administered 2016-08-31: 0.2 mg via INTRAVENOUS

## 2016-08-31 MED ORDER — HYDROMORPHONE HCL 1 MG/ML IJ SOLN
0.2500 mg | INTRAMUSCULAR | Status: DC | PRN
  Administered 2016-08-31 (×2): 0.5 mg via INTRAVENOUS

## 2016-08-31 MED ORDER — ETOMIDATE 2 MG/ML IV SOLN
INTRAVENOUS | Status: DC | PRN
  Administered 2016-08-31 (×2): 10 mg via INTRAVENOUS

## 2016-08-31 MED ORDER — HYDROMORPHONE HCL 1 MG/ML IJ SOLN
1.0000 mg | Freq: Once | INTRAMUSCULAR | Status: AC
  Administered 2016-08-31: 1 mg via INTRAVENOUS
  Filled 2016-08-31: qty 1

## 2016-08-31 MED ORDER — PROTAMINE SULFATE 10 MG/ML IV SOLN
INTRAVENOUS | Status: DC | PRN
  Administered 2016-08-31: 20 mg via INTRAVENOUS

## 2016-08-31 MED ORDER — LIDOCAINE-EPINEPHRINE 1 %-1:100000 IJ SOLN
INTRAMUSCULAR | Status: AC
  Filled 2016-08-31: qty 1

## 2016-08-31 MED ORDER — PROTAMINE SULFATE 10 MG/ML IV SOLN
INTRAVENOUS | Status: AC
  Filled 2016-08-31: qty 5

## 2016-08-31 MED ORDER — PROMETHAZINE HCL 25 MG/ML IJ SOLN: 6.2500 mg | INTRAMUSCULAR | Status: DC | PRN

## 2016-08-31 MED ORDER — PHENYLEPHRINE 40 MCG/ML (10ML) SYRINGE FOR IV PUSH (FOR BLOOD PRESSURE SUPPORT)
PREFILLED_SYRINGE | INTRAVENOUS | Status: AC
  Filled 2016-08-31: qty 10

## 2016-08-31 MED ORDER — FENTANYL CITRATE (PF) 100 MCG/2ML IJ SOLN
INTRAMUSCULAR | Status: DC | PRN
  Administered 2016-08-31: 50 ug via INTRAVENOUS
  Administered 2016-08-31 (×4): 25 ug via INTRAVENOUS
  Administered 2016-08-31: 50 ug via INTRAVENOUS

## 2016-08-31 MED ORDER — NEOSTIGMINE METHYLSULFATE 10 MG/10ML IV SOLN
INTRAVENOUS | Status: DC | PRN
  Administered 2016-08-31: 2 mg via INTRAVENOUS

## 2016-08-31 MED ORDER — ONDANSETRON HCL 4 MG/2ML IJ SOLN
INTRAMUSCULAR | Status: DC | PRN
  Administered 2016-08-31: 4 mg via INTRAVENOUS

## 2016-08-31 MED ORDER — ROCURONIUM BROMIDE 10 MG/ML (PF) SYRINGE
PREFILLED_SYRINGE | INTRAVENOUS | Status: AC
  Filled 2016-08-31: qty 10

## 2016-08-31 MED ORDER — ROCURONIUM BROMIDE 100 MG/10ML IV SOLN
INTRAVENOUS | Status: DC | PRN
  Administered 2016-08-31: 30 mg via INTRAVENOUS

## 2016-08-31 MED ORDER — HYDROMORPHONE HCL 2 MG/ML IJ SOLN
INTRAMUSCULAR | Status: AC
  Filled 2016-08-31: qty 1

## 2016-08-31 MED ORDER — ONDANSETRON HCL 4 MG/2ML IJ SOLN
INTRAMUSCULAR | Status: AC
  Filled 2016-08-31: qty 2

## 2016-08-31 SURGICAL SUPPLY — 42 items
ARMBAND PINK RESTRICT EXTREMIT (MISCELLANEOUS) ×2 IMPLANT
CANISTER SUCTION 2500CC (MISCELLANEOUS) ×2 IMPLANT
CLIP TI MEDIUM 6 (CLIP) ×2 IMPLANT
CLIP TI WIDE RED SMALL 24 (CLIP) ×2 IMPLANT
CLIP TI WIDE RED SMALL 6 (CLIP) ×2 IMPLANT
DERMABOND ADHESIVE PROPEN (GAUZE/BANDAGES/DRESSINGS) ×1
DERMABOND ADVANCED (GAUZE/BANDAGES/DRESSINGS) ×1
DERMABOND ADVANCED .7 DNX12 (GAUZE/BANDAGES/DRESSINGS) ×1 IMPLANT
DERMABOND ADVANCED .7 DNX6 (GAUZE/BANDAGES/DRESSINGS) ×1 IMPLANT
DRAPE INCISE IOBAN 66X45 STRL (DRAPES) ×2 IMPLANT
ELECT REM PT RETURN 9FT ADLT (ELECTROSURGICAL) ×2
ELECTRODE REM PT RTRN 9FT ADLT (ELECTROSURGICAL) ×1 IMPLANT
GEL ULTRASOUND 20GR AQUASONIC (MISCELLANEOUS) IMPLANT
GLOVE BIO SURGEON STRL SZ7.5 (GLOVE) ×2 IMPLANT
GLOVE BIOGEL PI IND STRL 6.5 (GLOVE) ×2 IMPLANT
GLOVE BIOGEL PI INDICATOR 6.5 (GLOVE) ×2
GLOVE ECLIPSE 7.0 STRL STRAW (GLOVE) ×2 IMPLANT
GLOVE INDICATOR 7.5 STRL GRN (GLOVE) ×2 IMPLANT
GLOVE SURG SS PI 7.0 STRL IVOR (GLOVE) ×4 IMPLANT
GOWN STRL REUS W/ TWL LRG LVL3 (GOWN DISPOSABLE) ×1 IMPLANT
GOWN STRL REUS W/ TWL XL LVL3 (GOWN DISPOSABLE) ×3 IMPLANT
GOWN STRL REUS W/TWL LRG LVL3 (GOWN DISPOSABLE) ×1
GOWN STRL REUS W/TWL XL LVL3 (GOWN DISPOSABLE) ×3
GRAFT GORETEX STRT 4-7X45 (Vascular Products) ×2 IMPLANT
HEMOSTAT SNOW SURGICEL 2X4 (HEMOSTASIS) ×2 IMPLANT
INSERT FOGARTY SM (MISCELLANEOUS) ×2 IMPLANT
KIT BASIN OR (CUSTOM PROCEDURE TRAY) ×2 IMPLANT
KIT ROOM TURNOVER OR (KITS) ×2 IMPLANT
NS IRRIG 1000ML POUR BTL (IV SOLUTION) ×2 IMPLANT
PACK CV ACCESS (CUSTOM PROCEDURE TRAY) ×2 IMPLANT
PAD ARMBOARD 7.5X6 YLW CONV (MISCELLANEOUS) ×4 IMPLANT
SUT GORETEX 6.0 TH-9 30 IN (SUTURE) IMPLANT
SUT GORETEX CV-6TTC-13 36IN (SUTURE) IMPLANT
SUT MNCRL AB 4-0 PS2 18 (SUTURE) ×4 IMPLANT
SUT PROLENE 5 0 C 1 24 (SUTURE) ×2 IMPLANT
SUT PROLENE 6 0 BV (SUTURE) ×10 IMPLANT
SUT VIC AB 2-0 CT1 27 (SUTURE) ×1
SUT VIC AB 2-0 CT1 TAPERPNT 27 (SUTURE) ×1 IMPLANT
SUT VIC AB 3-0 SH 27 (SUTURE) ×2
SUT VIC AB 3-0 SH 27X BRD (SUTURE) ×2 IMPLANT
UNDERPAD 30X30 (UNDERPADS AND DIAPERS) IMPLANT
WATER STERILE IRR 1000ML POUR (IV SOLUTION) ×2 IMPLANT

## 2016-08-31 NOTE — Patient Care Conference (Signed)
Inpatient RehabilitationTeam Conference and Plan of Care Update Date: 08/30/2016   Time: 2:55 PM    Patient Name: [name] Small      Medical Record Number: [id number]  Date of Birth: 1991-07-04 Sex: Female         Room/Bed: 4M02C/4M02C-01 Payor Info: Payor: MEDICARE / Plan: MEDICARE PART A AND B / Product Type: *No Product type* /    Admitting Diagnosis: debility End Stage Renal Disease N18.6  Admit Date/Time:  08/18/2016  3:33 PM Admission Comments: No comment available   Primary Diagnosis:  Thoracic compression fracture [facility](HCC) Principal Problem: Thoracic compression fracture [facility](HCC)  Patient Active Problem List   Diagnosis Date Noted  . Labile blood pressure   . Closed nondisplaced fracture of pelvis (HCC)   . Other osteoporosis with current pathological fracture   . Recurrent major depressive disorder, in partial remission (HCC)   . Acute blood loss anemia   . 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 Anemia of chronic disease   . H/O medication noncompliance   . 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 Acute on chronic systolic CHF (congestive heart failure) (HCC) 08/16/2016  . Noncompliance of patient with renal dialysis (HCC) 08/12/2016  . 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 Osteoporosis 08/01/2016  . Thoracic compression fracture - T7, T8, T12 07/27/2016  . Acute pulmonary edema (HCC) 07/26/2016  . Essential hypertension 06/17/2016  . Depression with anxiety 06/17/2016  . Volume overload 04/14/2016  . Peripheral edema 04/14/2016  . NICM (nonischemic cardiomyopathy) (HCC) 04/14/2016  . Internal jugular (IJ) vein thromboembolism, acute (HCC) 04/14/2016  . Pericardial effusion 04/14/2016  . Chronic anticoagulation   . Hypertensive urgency 04/11/2016  . DVT (deep venous thrombosis), left 04/11/2016  . Cellulitis of left upper extremity 03/31/2016  . Chronic pain syndrome 02/29/2016  . Chronic combined systolic  and diastolic congestive heart failure (HCC) 01/04/2016  . AICD (automatic cardioverter/defibrillator) present 01/04/2016  . 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 Pathologic pelvic fracture 01/04/2016  . Depression 01/04/2016  . Pain in the chest   . ESRD on dialysis (HCC)   . Tachycardia   . Elevated serum hCG   . Chest pain 10/11/2015  . Fluid overload 10/10/2015  . Hypertension 10/10/2015  . Anemia of chronic renal failure, stage 5 (HCC) 10/04/2015  . Thrombocytopenia (HCC) 10/04/2015  . Chest pain of uncertain etiology 10/04/2015  . Chronically Elevated troponin 03/03/2015  . Hyperkalemia 02/21/2015  . Acute on chronic systolic heart failure (HCC) 02/21/2012  . Pulmonary infarct (HCC) 02/07/2012  . ESRD (end stage renal disease) on dialysis (HCC) 02/03/2012  . PE (pulmonary embolism) 02/03/2012    Expected Discharge Date: Expected Discharge Date: 09/02/16  Team Members Present: Physician leading conference: Dr. Maryla MorrowAnkit Patel Social Worker Present: Dossie DerBecky Samara Stankowski, LCSW PT Present: Katherine Mantleodney Wishart, PT OT Present: Callie FieldingKatie Pittman, OT SLP Present: Jackalyn LombardNicole Page, SLP PPS Coordinator present : Tora DuckMarie Noel, RN, CRRN     Current Status/Progress Goal Weekly Team Focus  Medical   Weakness and functional deficits due to thoracic compression fractures, T7-T8 secondary to recent fall and recent history of pathological pelvic fracture  Pain, CHF, dialysis access, safety, mobility  See above   Bowel/Bladder   Continent of bowel LBM 08-29-16, Anuric  Remain continent of bowel  Assist with tolieting needs and treat as needed   Swallow/Nutrition/ Hydration             ADL's   supervision - min  A  mod I - sitting balance, grooming, and UB self care and supervision for all other goals  strengthening, endurance, balance, self care retraining, pt/family education, d/c planning   Mobility   supervision-min A  supervision except min A stairs and car transfer  safety and prevention of  falls/fractures, dynamic balance, gait, safe exercises for strengthening   Communication             Safety/Cognition/ Behavioral Observations            Pain   Chronic back of 6-8 /10  Pain < or = 4   Assess pain q4hrs and prn, premicate prior to therapy   Skin   lower back incision ota   No new skin injury/breakdown  Assess skin q shift and prn       *See Care Plan and progress notes for long and short-term goals.  Barriers to Discharge: Mobility, safety, pt compliance, dialysis access, CHF, pain    Possible Resolutions to Barriers:  Optimize pain meds -improving, therapies, no tx for DVTs,     Discharge Planning/Teaching Needs:  Home to her home with Mom and boyfriend checking on her, Mom can stay at night if needed.      Team Discussion:  Reaching her goals of supervision level-team recommends someone with her at home for her safety. Back pain is being managed. Having loop graft placed 11/9-hold therapies for Thursday. Will need family education with whoever will be with her. Prepare for DC Sat  Revisions to Treatment Plan:  DC Sat   Continued Need for Acute Rehabilitation Level of Care: The patient requires daily medical management by a physician with specialized training in physical medicine and rehabilitation for the following conditions: Daily direction of a multidisciplinary physical rehabilitation program to ensure safe treatment while eliciting the highest outcome that is of practical value to the patient.: Yes Daily medical management of patient stability for increased activity during participation in an intensive rehabilitation regime.: Yes Daily analysis of laboratory values and/or radiology reports with any subsequent need for medication adjustment of medical intervention for : Cardiac problems;Renal problems;Other  Lucy Chris 08/31/2016, 12:28 PM

## 2016-08-31 NOTE — Progress Notes (Signed)
Social Work Patient ID: [name] Small, female   DOB: 1990-12-09, 25 y.o.   MRN: [id number]  Met with pt to discuss team conference progress toward her goals and the team's recommendations.  She reports her Mom and Dad are going to switch off being with her. Thought both of them worked, she reports they will work around this. Asked for them to come in and attend therapies with her She report they have been here on the weekend and seen her in therapies. She is agreeable to a rollator for home and follow up therapies. Pt resting from surgery today.

## 2016-08-31 NOTE — Discharge Instructions (Signed)
Inpatient Rehab Discharge Instructions  [facility] Tech Discharge date and time: No discharge date for patient encounter.   Activities/Precautions/ Functional Status: Activity: TLSO back brace when out of bed Diet: renal diet Wound Care: none needed Functional status:  ___ No restrictions     ___ Walk up steps independently ___ 24/7 supervision/assistance   ___ Walk up steps with assistance ___ Intermittent supervision/assistance  ___ Bathe/dress independently ___ Walk with walker     _x__ Bathe/dress with assistance ___ Walk Independently    ___ Shower independently ___ Walk with assistance    ___ Shower with assistance ___ No alcohol     ___ Return to work/school ________  Special Instructions:  Continue hemodialysis as directed   COMMUNITY REFERRALS UPON DISCHARGE:    Home Health:   PT, OT, RN, AIDE  Agency:[facility] Phone:[phone]   Date of last service:09/02/2016  Medical Equipment/Items Ordered:ROLLATOR Levan Hurst  Agency/Supplier:[facility]   [phone]   My questions have been answered and I understand these instructions. I will adhere to these goals and the provided educational materials after my discharge from the hospital.  Patient/Caregiver Signature _______________________________ Date __________  Clinician Signature _______________________________________ Date __________  Please bring this form and your medication list with you to all your follow-up doctor's appointments.

## 2016-08-31 NOTE — Op Note (Signed)
    OPERATIVE NOTE   PROCEDURE: Right femoral av loop graft  PRE-OPERATIVE DIAGNOSIS: esrd  POST-OPERATIVE DIAGNOSIS: same  SURGEON: Alfonsa Vaile C. Randie Heinz, MD  ASSISTANT(S): Lianne Cure, PA  ANESTHESIA: general  ESTIMATED BLOOD LOSS: 25 cc   INDICATIONS:   [name] Small is a 25 y.o. female who presents with end-stage renal disease. She currently dialyzes via left groin tunneled catheter. She had previous right upper extremity axillary loop graft that was excised secondary to steal. She is now indicated for permanent access.  Operation: Patient was correctly identified taken to the operating room placed supine on the table and general anesthesia induced she was given antibiotics sterilely prepped and draped in the right leg in the usual fashion timeout called. A transverse incision in the right groin was made overlying the palpable arterial pulse. Dissected down through the skin and subcutaneous tissue to identify the common femoral artery. Then dissected out the branches including medial circumflex profunda and the SFA. Then turned my attention towards the vein where there was significant scar tissue from likely previous catheterization. Identified the greater saphenous vein dissected this and then below that dissected out the femoral vein and encircled with vessel loops. Small branches on the posterior aspect of the vein were clipped. I then tunneled a 4-7 loop graft with the counter incision on the thigh and arterial component laterally on the thigh with venous medially. Patient was heparinized at this time. The vein was clamped proximally and distally opened longitudinally and the graft trimmed to size. There was an obvious previous chronic thrombus with trabeculated within the vein and these were cut free. There was adequate forward and backbleeding. The graft was then sewn end to side with 5-0 Prolene suture. Following release of the clamps I flushed and there was no obvious bleeding. I  then trimmed the arterial component to size clamped the SFA profunda common femoral artery and sewed the graft after opening the artery end to side with 6-0 Prolene suture just at the level of the profunda femoris artery. Again flushing maneuvers were performed prior to releasing the clamps. After releasing there was strong signal in the runoff vein and there was an signal at the AT that was biphasic consistent with preoperative exam. Satisfied protamine was administered with the patient tolerated well the wounds were closed in layers Vicryl and Monocryl at the level of the skin and Dermabond placed above that. Patient tolerated the procedure well transferred PACU plans for return to rehabilitation today.  COMPLICATIONS: none immediate  CONDITION: stable   Joachim Carton C. Randie Heinz, MD [facility] Office: [phone] Pager: [phone]  08/31/2016, 10:52 AM

## 2016-08-31 NOTE — Progress Notes (Signed)
Patient ID: [name] Small, female   DOB: 09-17-1991, 25 y.o.   MRN: [id number]  [facility] Progress Note    Subjective:   S/p placement of R thigh AVGG today.  Reports some postop pain but otherwise doing well.     Objective:   BP (!) 88/56 (BP Location: Left Arm)   Pulse 92   Temp 98.5 F (36.9 C) (Oral)   Resp 18   Ht 5\' 3"  (1.6 m)   Wt 50.8 kg (111 lb 15.9 oz)   SpO2 100%   BMI 19.84 kg/m   Intake/Output: I/O last 3 completed shifts: In: 360 [P.O.:360] Out: 3000 [Other:3000]   Intake/Output this shift:  Total I/O In: 500 [I.V.:500] Out: 10 [Blood:10] Weight change: 1.6 kg (3 lb 8.4 oz)  Physical Exam: Gen: WD WN AAF in NAD CVS:no rub, RRR, + systolic murmur Resp:cta OEV:[id number] Ext:1+ edema lower ext and ulcerations with black eschar posterior L calf  Labs: BMET  Recent Labs Lab 08/25/16 1924 08/28/16 1502 08/31/16 0622  NA 138 136 135  K 4.6 5.3* 4.1  CL 103 98* 98*  CO2 20* 23 25  GLUCOSE 94 95 81  BUN 60* 77* 34*  CREATININE 5.87* 7.59* 3.90*  ALBUMIN 2.8* 3.3* 3.3*  CALCIUM 9.0 9.4 9.9  PHOS 4.3 5.8* 5.0*   CBC  Recent Labs Lab 08/25/16 1923 08/28/16 1502  WBC 8.2 10.0  HGB 8.5* 9.2*  HCT 28.1* 29.4*  MCV 89.2 89.9  PLT 288 342    @IMGRELPRIORS @ Medications:    . calcitonin (salmon)  1 spray Alternating Nares Daily  . calcitRIOL  1 mcg Oral Q M,W,F-HD  . cinacalcet  90 mg Oral Q supper  . darbepoetin (ARANESP) injection - DIALYSIS  200 mcg Intravenous Q Wed-HD  . diphenhydrAMINE  25 mg Intravenous Q M,W,F-HD  . feeding supplement (NEPRO CARB STEADY)  237 mL Oral BID BM  . HYDROmorphone      . lanthanum  1,000 mg Oral TID WC  . lidocaine  1 patch Transdermal Q24H  . multivitamin  1 tablet Oral QHS  . naproxen  500 mg Oral BID WC  . sertraline  100 mg Oral Daily   Dialysis Orders: Center: [facility] on MWF . EDW 51kg  HD Bath 2k, 2ca  Time 4hr Heparin 2000. Access L fem perm cath      Hec 1 mcg IV/HD no  esa or fe    New RUE AVG 08/01/16; removal 10/12 due to steal symptoms. L Fem Perm cath  Assessment/ Plan:   1. Vertebral fractures- cont with brace and inpatient rehab PT/OT.  Discussed with patient that her poorly controlled SHPTH played a role due to her iPTH levels of >2000 and stressed the importance of phosphorus control. 2. ESRD continue with HD qMWF.  Will challenge edw due to edema; last post-wt 50.8 kg.   3. 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  Renal diet and change renvela to fosrenol and follow.  4. 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  Suggested that she take her sensipar at night to reduce nausea. 6. Nutrition:renal diet 7. Hypertension:- stable 8. Vascular access- s/p R thigh AVGG 11/9.  Continue using L fem perm cath for now.  Bufford Buttner, MD [facility], [facility] Pager [phone] 08/31/2016, 4:01 PM

## 2016-08-31 NOTE — Anesthesia Procedure Notes (Signed)
Procedure Name: Intubation Date/Time: 08/31/2016 9:21 AM Performed by: Little Ishikawa L Pre-anesthesia Checklist: Patient identified, Emergency Drugs available, Suction available and Patient being monitored Patient Re-evaluated:Patient Re-evaluated prior to inductionOxygen Delivery Method: Circle System Utilized Preoxygenation: Pre-oxygenation with 100% oxygen Intubation Type: IV induction Ventilation: Mask ventilation without difficulty Laryngoscope Size: Mac and 3 Grade View: Grade I Tube type: Oral Tube size: 7.0 mm Number of attempts: 1 Airway Equipment and Method: Stylet and Oral airway Placement Confirmation: ETT inserted through vocal cords under direct vision,  positive ETCO2 and breath sounds checked- equal and bilateral Secured at: 21 cm Tube secured with: Tape Dental Injury: Teeth and Oropharynx as per pre-operative assessment

## 2016-08-31 NOTE — Anesthesia Postprocedure Evaluation (Signed)
Anesthesia Post Note  Patient: [name] Small  Procedure(s) Performed: Procedure(s) (LRB): INSERTION OF ARTERIOVENOUS (AV) GORE-TEX GRAFT- RIGHT FEMORAL LOOP GRAFT (Right)  Patient location during evaluation: PACU Anesthesia Type: General Level of consciousness: awake and alert, oriented and patient cooperative Pain management: pain level controlled Vital Signs Assessment: post-procedure vital signs reviewed and stable Respiratory status: spontaneous breathing, nonlabored ventilation and respiratory function stable Cardiovascular status: blood pressure returned to baseline and stable Postop Assessment: no signs of nausea or vomiting Anesthetic complications: no Comments: Mild sore throat    Last Vitals:  Vitals:   08/31/16 1124 08/31/16 1135  BP: 106/61 97/65  Pulse: (!) 103 (!) 106  Resp: 16 16  Temp:      Last Pain:  Vitals:   08/31/16 1135  TempSrc:   PainSc: Asleep        RLE Motor Response: Purposeful movement (08/31/16 1135) RLE Sensation: Full sensation (08/31/16 1135)      [name],E. Darshay Deupree

## 2016-08-31 NOTE — Progress Notes (Signed)
Social Work Lucy Chrisebecca G Dearius Hoffmann, LCSW Social Worker Signed   Patient Care Conference Date of Service: 08/31/2016 12:28 PM      Hide copied text Hover for attribution information Inpatient RehabilitationTeam Conference and Plan of Care Update Date: 08/30/2016   Time: 2:55 PM      Patient Name: [name] SorrowVictoria Small      Medical Record Number: [id number]  Date of Birth: 11/22/1990 Sex: Female         Room/Bed: 4M02C/4M02C-01 Payor Info: Payor: MEDICARE / Plan: MEDICARE PART A AND B / Product Type: *No Product type* /     Admitting Diagnosis: debility End Stage Renal Disease N18.6  Admit Date/Time:  08/18/2016  3:33 PM Admission Comments: No comment available    Primary Diagnosis:  Thoracic compression fracture [facility](HCC) Principal Problem: Thoracic compression fracture [facility](HCC)       Patient Active Problem List    Diagnosis Date Noted  . Labile blood pressure    . Closed nondisplaced fracture of pelvis (HCC)    . Other osteoporosis with current pathological fracture    . Recurrent major depressive disorder, in partial remission (HCC)    . Acute blood loss anemia    . Chronic deep vein thrombosis (DVT) of femoral vein (HCC)    . Anemia of chronic disease    . H/O medication noncompliance    . 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 Acute on chronic systolic CHF (congestive heart failure) (HCC) 08/16/2016  . Noncompliance of patient with renal dialysis (HCC) 08/12/2016  . 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 Osteoporosis 08/01/2016  . Thoracic compression fracture - T7, T8, T12 07/27/2016  . Acute pulmonary edema (HCC) 07/26/2016  . Essential hypertension 06/17/2016  . Depression with anxiety 06/17/2016  . Volume overload 04/14/2016  . Peripheral edema 04/14/2016  . NICM (nonischemic cardiomyopathy) (HCC) 04/14/2016  . Internal jugular (IJ) vein thromboembolism, acute (HCC) 04/14/2016  . Pericardial effusion 04/14/2016  . Chronic anticoagulation    .  Hypertensive urgency 04/11/2016  . DVT (deep venous thrombosis), left 04/11/2016  . Cellulitis of left upper extremity 03/31/2016  . Chronic pain syndrome 02/29/2016  . Chronic combined systolic and diastolic congestive heart failure (HCC) 01/04/2016  . AICD (automatic cardioverter/defibrillator) present 01/04/2016  . 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 Pathologic pelvic fracture 01/04/2016  . Depression 01/04/2016  . Pain in the chest    . ESRD on dialysis (HCC)    . Tachycardia    . Elevated serum hCG    . Chest pain 10/11/2015  . Fluid overload 10/10/2015  . Hypertension 10/10/2015  . Anemia of chronic renal failure, stage 5 (HCC) 10/04/2015  . Thrombocytopenia (HCC) 10/04/2015  . Chest pain of uncertain etiology 10/04/2015  . Chronically Elevated troponin 03/03/2015  . Hyperkalemia 02/21/2015  . Acute on chronic systolic heart failure (HCC) 02/21/2012  . Pulmonary infarct (HCC) 02/07/2012  . ESRD (end stage renal disease) on dialysis (HCC) 02/03/2012  . PE (pulmonary embolism) 02/03/2012      Expected Discharge Date: Expected Discharge Date: 09/02/16   Team Members Present: Physician leading conference: Dr. Maryla MorrowAnkit Patel Social Worker Present: Dossie DerBecky Yamen Castrogiovanni, LCSW PT Present: Katherine Mantleodney Wishart, PT OT Present: Callie FieldingKatie Pittman, OT SLP Present: Jackalyn LombardNicole Page, SLP PPS Coordinator present : Tora DuckMarie Noel, RN, CRRN       Current Status/Progress Goal Weekly Team Focus  Medical   Weakness and functional deficits due to thoracic compression fractures, T7-T8 secondary to recent fall and recent history of  pathological pelvic fracture  Pain, CHF, dialysis access, safety, mobility  See above   Bowel/Bladder   Continent of bowel LBM 08-29-16, Anuric  Remain continent of bowel  Assist with tolieting needs and treat as needed   Swallow/Nutrition/ Hydration             ADL's   supervision - min A  mod I - sitting balance, grooming, and UB self care and supervision for all other goals   strengthening, endurance, balance, self care retraining, pt/family education, d/c planning   Mobility   supervision-min A  supervision except min A stairs and car transfer  safety and prevention of falls/fractures, dynamic balance, gait, safe exercises for strengthening   Communication             Safety/Cognition/ Behavioral Observations           Pain   Chronic back of 6-8 /10  Pain < or = 4   Assess pain q4hrs and prn, premicate prior to therapy   Skin   lower back incision ota   No new skin injury/breakdown  Assess skin q shift and prn        *See Care Plan and progress notes for long and short-term goals.   Barriers to Discharge: Mobility, safety, pt compliance, dialysis access, CHF, pain   Possible Resolutions to Barriers:  Optimize pain meds -improving, therapies, no tx for DVTs,    Discharge Planning/Teaching Needs:  Home to her home with Mom and boyfriend checking on her, Mom can stay at night if needed.      Team Discussion:  Reaching her goals of supervision level-team recommends someone with her at home for her safety. Back pain is being managed. Having loop graft placed 11/9-hold therapies for Thursday. Will need family education with whoever will be with her. Prepare for DC Sat  Revisions to Treatment Plan:  DC Sat    Continued Need for Acute Rehabilitation Level of Care: The patient requires daily medical management by a physician with specialized training in physical medicine and rehabilitation for the following conditions: Daily direction of a multidisciplinary physical rehabilitation program to ensure safe treatment while eliciting the highest outcome that is of practical value to the patient.: Yes Daily medical management of patient stability for increased activity during participation in an intensive rehabilitation regime.: Yes Daily analysis of laboratory values and/or radiology reports with any subsequent need for medication adjustment of medical intervention for :  Cardiac problems;Renal problems;Other   Lucy Chris 08/31/2016, 12:28 PM       Patient ID: [name] Small, female   DOB: [birth year], 25 y.o.   MRN: [id number]

## 2016-08-31 NOTE — Progress Notes (Signed)
[place] PH[facility]ICAL [facility]ICINE & REHABILITATION     PROGRESS NOTE    Subjective/Complaints: Pt seen this afternoon after having her procedure in the AM.  She is tired and missed her day of therapies, but states she is okay.    ROS: +Incisional pain. Denies CP, SOB, N/V/D.  Objective: Vital Signs: Blood pressure (!) 88/56, pulse 92, temperature 98.5 F (36.9 C), temperature source Oral, resp. rate 18, height 5\' 3"  (1.6 m), weight 50.8 kg (111 lb 15.9 oz), SpO2 100 %. No results found. No results for input(s): WBC, HGB, HCT, PLT in the last 72 hours.  Recent Labs  08/31/16 0622  NA 135  K 4.1  CL 98*  GLUCOSE 81  BUN 34*  CREATININE 3.90*  CALCIUM 9.9   CBG (last 3)  No results for input(s): GLUCAP in the last 72 hours.  Wt Readings from Last 3 Encounters:  08/31/16 50.8 kg (111 lb 15.9 oz)  08/18/16 50.8 kg (111 lb 15.9 oz)  08/04/16 51.2 kg (112 lb 14 oz)    Physical Exam:  Constitutional: She appears well-developed. Small stature. NAD. HENT: Normocephalic and atraumatic.  Eyes: EOMI.  No draingage  Cardiovascular: RRR. No JVD. Respiratory: No respiratory distress. She has no wheezes. She has no rales. She exhibits no tenderness  GI: BS+. She exhibits no distension. There is no tenderness. There is no rebound.  Musculoskeletal: She exhibits no edema. No TTP. Neurological: She is alert and oriented  Strength 4+/5 UE prox to distal.  B/l LE 4-/5 HF. KE (pain inhibition), ankle dorsi/plantar flexion 4+/5  Skin: No rash noted. No erythema. Warm and dry.  Assessment/Plan: 1. Functional and mobility deficits  secondary to thoracic compression fractures which require 3+ hours per day of interdisciplinary therapy in a comprehensive inpatient rehab setting. Physiatrist is providing close team supervision and 24 hour management of active medical problems listed below. Physiatrist and rehab team continue to assess barriers to discharge/monitor patient progress toward  functional and medical goals.  Function:  Bathing Bathing position   Position: Wheelchair/chair at sink  Bathing parts Body parts bathed by patient: Right arm, Left arm, Chest, Abdomen, Right upper leg, Left upper leg, Front perineal area, Buttocks, Right lower leg, Left lower leg, Back Body parts bathed by helper: Right lower leg, Left lower leg  Bathing assist Assist Level: Supervision or verbal cues      Upper Body Dressing/Undressing Upper body dressing   What is the patient wearing?: Pull over shirt/dress, Orthosis     Pull over shirt/dress - Perfomed by patient: Thread/unthread right sleeve, Thread/unthread left sleeve, Put head through opening, Pull shirt over trunk       Orthosis activity level: Performed by patient  Upper body assist Assist Level: Supervision or verbal cues   Set up : To obtain clothing/put away  Lower Body Dressing/Undressing Lower body dressing   What is the patient wearing?: Underwear, Pants, Shoes Underwear - Performed by patient: Thread/unthread right underwear leg, Thread/unthread left underwear leg, Pull underwear up/down   Pants- Performed by patient: Thread/unthread right pants leg, Thread/unthread left pants leg, Pull pants up/down   Non-skid slipper socks- Performed by patient: Don/doff right sock, Don/doff left sock   Socks - Performed by patient: Don/doff right sock, Don/doff left sock   Shoes - Performed by patient: Don/doff right shoe, Don/doff left shoe            Lower body assist Assist for lower body dressing: Touching or steadying assistance (Pt > 75%)  Toileting Toileting Toileting activity did not occur: No continent bowel/bladder event Toileting steps completed by patient: Adjust clothing prior to toileting, Performs perineal hygiene, Adjust clothing after toileting   Toileting Assistive Devices: Grab bar or rail  Toileting assist Assist level: Supervision or verbal cues   Transfers Chair/bed transfer    Chair/bed transfer method: Stand pivot, Ambulatory Chair/bed transfer assist level: Supervision or verbal cues Chair/bed transfer assistive device: Armrests, Patent attorneyWalker     Locomotion Ambulation     Max distance: 15160ft Assist level: Supervision or verbal cues   Wheelchair   Type: Manual Max wheelchair distance: 125 ft Assist Level: Supervision or verbal cues  Cognition Comprehension Comprehension assist level: Follows complex conversation/direction with extra time/assistive device  Expression Expression assist level: Expresses complex 90% of the time/cues < 10% of the time  Social Interaction Social Interaction assist level: Interacts appropriately with others with medication or extra time (anti-anxiety, antidepressant).  Problem Solving Problem solving assist level: Solves complex problems: Recognizes & self-corrects  Memory Memory assist level: Complete Independence: No helper   Medical Problem List and Plan: 1.  Weakness and functional deficits due to thoracic compression fractures, T7-T8 secondary to recent fall and recent history of pathological pelvic fracture  Cont CIR, progressing do discharge, however, therapies held today due to procedure  -Back brace when out of bed  -orthostatic negative             -continue therapies 2.  DVT Prophylaxis/Anticoagulation:   Vascular study showing chronic DVT  Pt seen by Heme/Onc (appreciate recs), no need for anticoagulation at this time.  Further per Nephro, due to pt's noncompliance, at risk for bleed.   3. Pain Management with a history of narcotic abuse:   Naprosyn 500 mg BID  Oxycodone PRN, changed to 10mg  q4 PRN  Lidoderm patch added 11/3, d/ced per pt  Improved on 11/9  Robaxin as needed  Dilaudid for more severe pain, d/ced on 10/30  D/ced fentanyl 11/1 4. Mood: Zoloft 100 mg daily. Appreciate Neuropsych following.   5. Neuropsych: This patient is capable of making decisions on her own behalf. 6. Skin/Wound Care: Routine  skin checks 7. 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 Acute on chronic anemia.   Hb 9.2 on 11/6 (improving)  Hemocult stools positive  Cont to monitor 11. Medical noncompliance. Counseling 12. Severe Osteoporosis  With pathological fractures  Cont current treatment 13. Labile BP  Likely related to HD, recs per Nephro  Stable on 11/9   LOS (Days) 13 A FACE TO FACE EVALUATION WAS PERFORMED  Donyae Kilner Karis Jubanil Vasilia Dise 08/31/2016 4:10 PM

## 2016-08-31 NOTE — Progress Notes (Signed)
  Progress Note    08/31/2016 8:13 AM * No surgery found *  Subjective:  No complaints today  Vitals:   08/31/16 0540 08/31/16 0746  BP: (!) 138/98 (!) 138/99  Pulse: 96 (!) 48  Resp: 18 18  Temp: 97.9 F (36.6 C)     Physical Exam [id number] Abdomen is soft Palpable right femoral pulse  CBC    Component Value Date/Time   WBC 10.0 08/28/2016 1502   RBC 3.27 (L) 08/28/2016 1502   HGB 9.2 (L) 08/28/2016 1502   HCT 29.4 (L) 08/28/2016 1502   PLT 342 08/28/2016 1502   MCV 89.9 08/28/2016 1502   MCH 28.1 08/28/2016 1502   MCHC 31.3 08/28/2016 1502   RDW 22.0 (H) 08/28/2016 1502   LYMPHSABS 1.0 07/26/2016 1803   MONOABS 0.6 07/26/2016 1803   EOSABS 0.0 07/26/2016 1803   BASOSABS 0.0 07/26/2016 1803    BMET    Component Value Date/Time   NA 135 08/31/2016 0622   K 4.1 08/31/2016 0622   CL 98 (L) 08/31/2016 0622   CO2 25 08/31/2016 0622   GLUCOSE 81 08/31/2016 0622   BUN 34 (H) 08/31/2016 0622   CREATININE 3.90 (H) 08/31/2016 0622   CALCIUM 9.9 08/31/2016 0622   GFRNONAA 15 (L) 08/31/2016 0622   GFRAA 17 (L) 08/31/2016 0622    INR    Component Value Date/Time   INR 1.35 06/17/2016 0229     Intake/Output Summary (Last 24 hours) at 08/31/16 0813 Last data filed at 08/30/16 2000  Gross per 24 hour  Intake              240 ml  Output             3000 ml  Net            -2760 ml     Assessment:  25 y.o. female with esrd s/p R upper arm av graft that was removed 2/2 steal  Plan: OR today for Right femoral loop av graft Discussed risks and benefits, patient agrees to proceed.   Shalay Carder C. Randie Heinz, MD [facility] Office: [phone] Pager: [phone]  08/31/2016 8:13 AM

## 2016-08-31 NOTE — Transfer of Care (Signed)
Immediate Anesthesia Transfer of Care Note  Patient: [name] Small  Procedure(s) Performed: Procedure(s): INSERTION OF ARTERIOVENOUS (AV) GORE-TEX GRAFT- RIGHT FEMORAL LOOP GRAFT (Right)  Patient Location: PACU  Anesthesia Type:General  Level of Consciousness: awake and alert   Airway & Oxygen Therapy: Patient Spontanous Breathing and Patient connected to nasal cannula oxygen  Post-op Assessment: Report given to RN, Post -op Vital signs reviewed and stable and Patient moving all extremities X 4  Post vital signs: Reviewed and stable  Last Vitals:  Vitals:   08/31/16 0746 08/31/16 1110  BP: (!) 138/99 113/79  Pulse: (!) 48 (!) 109  Resp: 18 16  Temp:  37 C    Last Pain:  Vitals:   08/31/16 0540  TempSrc: Oral  PainSc:       Patients Stated Pain Goal: 4 (08/29/16 1304)  Complications: No apparent anesthesia complications

## 2016-09-01 ENCOUNTER — Inpatient Hospital Stay (HOSPITAL_COMMUNITY): Payer: Medicare Other | Admitting: Occupational Therapy

## 2016-09-01 ENCOUNTER — Inpatient Hospital Stay (HOSPITAL_COMMUNITY): Payer: Medicare Other | Admitting: Physical Therapy

## 2016-09-01 ENCOUNTER — Encounter (HOSPITAL_COMMUNITY): Payer: Self-pay | Admitting: Vascular Surgery

## 2016-09-01 MED ORDER — RENA-VITE PO TABS: 1.0000 | ORAL_TABLET | Freq: Every day | ORAL | 3 refills | Status: AC

## 2016-09-01 MED ORDER — HYDROMORPHONE HCL 1 MG/ML IJ SOLN
1.0000 mg | Freq: Once | INTRAMUSCULAR | Status: AC
  Administered 2016-09-01: 1 mg via INTRAVENOUS
  Filled 2016-09-01: qty 1

## 2016-09-01 MED ORDER — FAMOTIDINE 20 MG PO TABS: 20.0000 mg | ORAL_TABLET | Freq: Two times a day (BID) | ORAL | 0 refills | Status: DC | PRN

## 2016-09-01 MED ORDER — CALCITRIOL 0.5 MCG PO CAPS: 1.0000 ug | ORAL_CAPSULE | ORAL | 0 refills | Status: DC

## 2016-09-01 MED ORDER — METHOCARBAMOL 500 MG PO TABS: 500.0000 mg | ORAL_TABLET | Freq: Four times a day (QID) | ORAL | 0 refills | Status: AC | PRN

## 2016-09-01 MED ORDER — NAPROXEN 500 MG PO TABS: 500.0000 mg | ORAL_TABLET | Freq: Two times a day (BID) | ORAL | 0 refills | Status: DC

## 2016-09-01 MED ORDER — OXYCODONE HCL 10 MG PO TABS: 10.0000 mg | ORAL_TABLET | ORAL | 0 refills | Status: DC | PRN

## 2016-09-01 MED ORDER — LANTHANUM CARBONATE 1000 MG PO CHEW: 1000.0000 mg | CHEWABLE_TABLET | Freq: Three times a day (TID) | ORAL | 0 refills | Status: AC

## 2016-09-01 MED ORDER — LIDOCAINE 5 % EX PTCH
1.0000 | MEDICATED_PATCH | CUTANEOUS | 0 refills | Status: DC
Start: 2016-09-01 — End: 2016-10-09

## 2016-09-01 MED ORDER — SERTRALINE HCL 100 MG PO TABS: 100.0000 mg | ORAL_TABLET | Freq: Every day | ORAL | 3 refills | Status: AC

## 2016-09-01 MED ORDER — CALCITONIN (SALMON) 200 UNIT/ACT NA SOLN: 1.0000 | Freq: Every day | NASAL | 0 refills | Status: AC

## 2016-09-01 MED ORDER — DIPHENHYDRAMINE HCL 50 MG/ML IJ SOLN
INTRAMUSCULAR | Status: AC
  Administered 2016-09-02: 25 mg via INTRAVENOUS
  Filled 2016-09-01: qty 1

## 2016-09-01 MED ORDER — CINACALCET HCL 30 MG PO TABS: 90.0000 mg | ORAL_TABLET | Freq: Every day | ORAL | 0 refills | Status: DC

## 2016-09-01 NOTE — Discharge Summary (Signed)
Occupational Therapy Discharge Summary  Patient Details  Name: [name] Small MRN: [id number] Date of Birth: Oct 25, 1990  Today's Date: 09/01/2016 OT Individual Time: 1433-1500 and 5465-0354 OT Individual Time Calculation (min): 27 min and 60 minutes  Patient has met 11 of 11 long term goals due to improved activity tolerance, improved balance, postural control, improved attention and improved awareness.  Patient to discharge at overall Supervision level.  Patient's parents are independent to provide the necessary cognitive assistance at discharge.    All goals met.   Recommendation:  Patient will benefit from ongoing skilled OT services in home health setting to continue to advance functional skills in the area of BADL and iADL.  Equipment: No equipment provided  Reasons for discharge: treatment goals met  Patient/family agrees with progress made and goals achieved: Yes  Skilled Therapeutic Intervention: Pt was sitting at EOB finishing breakfast at time of arrival. Pt reported pain in R LE. Nursing was notified and pt medicated at start of session. Pt was agreeable to get dressed but requested to wait to bathe until end of day when medicine kicked in. Pt ambulated with orthosis, rollator and supervision to closet to retrieve ADL items and dressed while standing at sink with rollator as needed. Extra time to complete due to pain with pt instructed on mindfulness techniques to assist. Pt also educated on using cryotherapy at time of discharge if pain persists. Pt was agreeable to pack up belongings for d/c tomorrow. Pt completed with min cues for use of reacher and locking rollator while ambulating around room. At end of session pt was left at EOB and all needs within reach.   2nd Session 1:1 Tx (27 minutes) Skilled OT session completed with focus on standing endurance and d/c planning. Pt was sitting at EOB with back orthosis at time of arrival, continued to decline bathing. Pt agreeable  to simulate bathing at EOB with verbalized teach back on sequencing and safety for safe completion at home. For remainder of session pt completed coloring activity while standing to increase endurance and psychosocial wellness. Pt encouraged to complete this activity in standing at home to maintain current functional level. Pt educated on overall progress and goal achievement with verbalized understanding. Pt left at EOB at end of session with all needs within reach.   OT Discharge Precautions/Restrictions Precautions Precautions: Fall;Back Required Braces or Orthoses: Spinal Brace Spinal Brace: Thoracolumbosacral orthotic Restrictions Weight Bearing Restrictions: No General   Vital Signs Therapy Vitals Temp: 98.3 F (36.8 C) Temp Source: Oral Pulse Rate: (!) 103 Resp: 18 BP: (!) 110/54 Patient Position (if appropriate): Sitting Oxygen Therapy SpO2: 98 % O2 Device: Not Delivered Pain Pain Assessment Pain Score: 8  Pain Type: Acute pain Pain Location: Leg Pain Orientation: Right Pain Descriptors / Indicators: Sharp Pain Onset: With Activity Pain Intervention(s): Rest;RN made aware ADL ADL ADL Comments: Please see functional navigator for ADL status Vision/Perception  Vision- History Baseline Vision/History: No visual deficits Patient Visual Report: No change from baseline Vision- Assessment Vision Assessment?: No apparent visual deficits  Cognition Overall Cognitive Status: Within Functional Limits for tasks assessed Arousal/Alertness: Awake/alert Orientation Level: Oriented X4 Attention: Focused;Sustained Focused Attention: Appears intact Sustained Attention: Appears intact Selective Attention: Appears intact Memory: Appears intact Awareness: Appears intact Problem Solving: Appears intact Safety/Judgment: Appears intact Sensation Sensation Light Touch: Appears Intact (Intact for UEs) Light Touch Impaired Details: Impaired RLE Stereognosis: Not  tested Hot/Cold: Appears Intact Proprioception: Appears Intact Coordination Gross Motor Movements are Fluid and Coordinated: No Fine Motor  Movements are Fluid and Coordinated: Yes Motor  Motor Motor: Other (comment) (global weakness and decreased caridiorespiratory endurance) Motor - Discharge Observations: Generalized weakness Mobility  Bed Mobility Bed Mobility: Supine to Sit;Sit to Supine Supine to Sit: 6: Modified independent (Device/Increase time);HOB flat Sit to Supine: 6: Modified independent (Device/Increase time);HOB flat  Trunk/Postural Assessment  Cervical Assessment Cervical Assessment: Within Functional Limits Thoracic Assessment Thoracic Assessment: Exceptions to [facility] (due to back orthosis) Lumbar Assessment Lumbar Assessment: Exceptions to [facility] (due to back orthosis) Postural Control Postural Control: Deficits on evaluation  Balance Balance Balance Assessed: Yes Static Sitting Balance Static Sitting - Balance Support: No upper extremity supported Static Sitting - Level of Assistance: 6: Modified independent (Device/Increase time) Dynamic Sitting Balance Dynamic Sitting - Balance Support: No upper extremity supported Dynamic Sitting - Level of Assistance: 6: Modified independent (Device/Increase time) Static Standing Balance Static Standing - Balance Support: Bilateral upper extremity supported Static Standing - Level of Assistance: 5: Stand by assistance Dynamic Standing Balance Dynamic Standing - Balance Support: Right upper extremity supported;Left upper extremity supported Dynamic Standing - Level of Assistance: 5: Stand by assistance Extremity/Trunk Assessment RUE Assessment RUE Assessment: Within Functional Limits (3+/5 R UE and 4-/5 L UE) LUE Assessment LUE Assessment: Within Functional Limits (4-/5 MMT)   See Function Navigator for Current Functional Status.  Marwin Primmer A Raelle Chambers 09/01/2016, 5:00 PM

## 2016-09-01 NOTE — Discharge Summary (Signed)
Discharge summary job [id number]

## 2016-09-01 NOTE — Progress Notes (Addendum)
Patient ID: [name] Small, female   DOB: October 17, 1991, 25 y.o.   MRN: [id number]   [facility] Progress Note    Subjective:   C/o right thigh pain from AVg   Objective:   BP 96/86 (BP Location: Left Arm)   Pulse 94   Temp 98.2 F (36.8 C) (Oral)   Resp 17   Ht 5\' 3"  (1.6 m)   Wt 53.8 kg (118 lb 9.7 oz)   SpO2 96%   BMI 21.01 kg/m   Intake/Output: I/O last 3 completed shifts: In: 860 [P.O.:360; I.V.:500] Out: 3010 [Other:3000; Blood:10]   Intake/Output this shift:  Total I/O In: 240 [P.O.:240] Out: -  Weight change: -0.1 kg (-3.5 oz)  Physical Exam: Gen:NAD CVS:no rub Resp:cta PJK:DTOIZT Ext: minimal pretibial edema, right thigh AVG +T/B, tender to touch  Labs: BMET  Recent Labs Lab 08/25/16 1924 08/28/16 1502 08/31/16 0622  NA 138 136 135  K 4.6 5.3* 4.1  CL 103 98* 98*  CO2 20* 23 25  GLUCOSE 94 95 81  BUN 60* 77* 34*  CREATININE 5.87* 7.59* 3.90*  ALBUMIN 2.8* 3.3* 3.3*  CALCIUM 9.0 9.4 9.9  PHOS 4.3 5.8* 5.0*   CBC  Recent Labs Lab 08/25/16 1923 08/28/16 1502  WBC 8.2 10.0  HGB 8.5* 9.2*  HCT 28.1* 29.4*  MCV 89.2 89.9  PLT 288 342    @IMGRELPRIORS @ Medications:    . calcitonin (salmon)  1 spray Alternating Nares Daily  . calcitRIOL  1 mcg Oral Q M,W,F-HD  . cinacalcet  90 mg Oral Q supper  . darbepoetin (ARANESP) injection - DIALYSIS  200 mcg Intravenous Q Wed-HD  . diphenhydrAMINE  25 mg Intravenous Q M,W,F-HD  . feeding supplement (NEPRO CARB STEADY)  237 mL Oral BID BM  . lanthanum  1,000 mg Oral TID WC  . lidocaine  1 patch Transdermal Q24H  . multivitamin  1 tablet Oral QHS  . naproxen  500 mg Oral BID WC  . sertraline  100 mg Oral Daily   Dialysis Orders: Center: [facility] on MWF. EDW 51kg HD Bath 2k, 2caTime 4hrHeparin 2000. Access L fem perm cath  Hec IV/HD no esa or fe  New RUE AVG 08/01/16; removal 10/12 due to steal symptoms. L Fem Perm cath  Assessment/ Plan:   1. Vertebral  fractures- cont with brace and inpatient rehab PT/OT.  Discussed with patient that her poorly controlled SHPTH played a role due to her iPTH levels of >2000 and stressed the importance of phosphorus control. 1. Switched to lanthanum for better phos binding efficiency (she likes it better as well) 2. ESRD contin with HD qMWF.  Will challenge edw due to edema. 3. 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  Renal diet and change renvela to fosrenol and follow.  4. 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 Nutrition:renal diet 7. Hypertension:- stable 8. Vascular access- s/p right femoral AVG per Dr. Arbie Cookey 08/31/16 +T/B but tender.  Left fem TDC functioning well.  Irena Cords, MD [facility], [facility] Pager [phone] 09/01/2016, 11:02 AM

## 2016-09-01 NOTE — Progress Notes (Signed)
Physical Therapy Session Note  Patient Details  Name: [name] Small MRN: [id number] Date of Birth: 1991/10/17  Today's Date: 09/01/2016 PT Individual Time: 0903-0930 PT Individual Time Calculation (min): 27 min   Short Term Goals: Week 2:  PT Short Term Goal 1 (Week 2): = LTG with D/C end of week 11/11  Skilled Therapeutic Interventions/Progress Updates:    Patient sitting EOB with TLSO donned upon arrival, reporting increased pain in RLE 8/10 premedicated s/p fistula placement yesterday. Patient's rollator adjusted to patient height. Patient required increased time and effort due to pain to complete sit <> stand transfers and ambulation using rollator in controlled environment x 100 ft with supervision. Instructed in seated HEP using handout for LLE LAQ, LLE hip flexion, BLE heel raises, and BLE toe raises. Education provided regarding energy conservation, importance of spending time outside of bed at home to participate in meaningful activities, and completing HEP daily for strengthening and activity tolerance, patient verbalized understanding. Patient returned to room and left sitting EOB with all needs in reach.   Therapy Documentation Precautions:  Precautions Precautions: Fall, Back Precaution Booklet Issued: No Precaution Comments: Patient able to state precautions. Required Braces or Orthoses: Spinal Brace Spinal Brace: Thoracolumbosacral orthotic Restrictions Weight Bearing Restrictions: No Pain: Pain Assessment Pain Assessment: 0-10 Pain Score: 8  Pain Type: Acute pain;Surgical pain Pain Location: Leg Pain Orientation: Right Pain Descriptors / Indicators: Aching Pain Onset: On-going Pain Intervention(s): Rest (premedicated)  See Function Navigator for Current Functional Status.   Therapy/Group: Individual Therapy  Kerney Elbe 09/01/2016, 9:37 AM

## 2016-09-01 NOTE — Progress Notes (Addendum)
[facility]  Subjective  - Doing well over all   Objective 96/86 94 98.2 F (36.8 C) (Oral) 17 96%  Intake/Output Summary (Last 24 hours) at 09/01/16 0838 Last data filed at 09/01/16 0830  Gross per 24 hour  Intake             1100 ml  Output               10 ml  Net             1090 ml    Right thigh graft incision with out hematoma, palpable thrill Right foot warm and well perfused  Assessment/Planning: POD # 1 right thigh graft  Do not stick for 4 weeks.  F/U PRN with Dr. Toy Baker, EMMA [facility] 09/01/2016 8:38 AM --  Laboratory Lab Results: No results for input(s): WBC, HGB, HCT, PLT in the last 72 hours. BMET  Recent Labs  08/31/16 0622  NA 135  K 4.1  CL 98*  CO2 25  GLUCOSE 81  BUN 34*  CREATININE 3.90*  CALCIUM 9.9    COAG Lab Results  Component Value Date   INR 1.35 06/17/2016   INR 1.55 (H) 04/12/2016   INR 1.35 03/31/2016   No results found for: PTT  I have independently interviewed patient and agree with PA assessment and plan above. I am here over the weekend if she has issues.  Shany Marinez C. Randie Heinz, MD [facility] Office: [phone] Pager: [phone]

## 2016-09-01 NOTE — Progress Notes (Signed)
Social Work  Discharge Note  The overall goal for the admission was met for:   Discharge location: Yes-HOME TO [place] 24 HR SUPERVISION  Length of Stay: Yes-15 DAYS  Discharge activity level: Yes-SUPERVISION LEVEL  Home/community participation: Yes  Services provided included: MD, RD, PT, OT, SLP, RN, CM, TR, Pharmacy and SW  Financial Services: Medicare, Medicaid and Private Insurance: [place]  Follow-up services arranged: Home Health: [place] CARE-PT,OT,RN,AIDE, DME: [place] and Patient/Family has no preference for HH/DME agencies  Comments (or additional information):PT DID WELL AND WILL HAVE HER PARENTS ROTATING TO PROVIDE THE 24 HR SUPERVISION LEVEL TEAM RECOMMENDS.  Patient/Family verbalized understanding of follow-up arrangements: Yes  Individual responsible for coordination of the follow-up plan: PARENTS & PATIENT  Confirmed correct DME delivered: Elease Hashimoto 09/01/2016    Elease Hashimoto

## 2016-09-01 NOTE — Discharge Summary (Signed)
NAMEMarland Small  JELINA, [name]             ACCOUNT NO.:  [id number]  MEDICAL RECORD NO.:  [id number]  LOCATION:  4M02C                        FACILITY:  [facility]  PHYSICIAN:  Maryla Morrow, MD        DATE OF BIRTH:  08/12/1991  DATE OF ADMISSION:  08/18/2016 DATE OF DISCHARGE:  09/02/2016                              DISCHARGE SUMMARY   DISCHARGE DIAGNOSES: 1. Weakness and functional deficits due to thoracic compression     fractures. 2. Sequential compression devices for deep vein thrombosis     prophylaxis. 3. Pain management with history of narcotic abuse. 4. Depression. 5. End-stage renal disease. 6. Nonischemic cardiomyopathy. 7. Acute on chronic anemia. 8. Medical noncompliance. 9. Severe osteoporosis. 10.Labile hypertension.  HISTORY OF PRESENT ILLNESS:  This is a 25 year old right-handed female, with history of end-stage renal disease, on hemodialysis due to childhood polycystic disease, severe systolic ejection fraction of 88% with AICD and medical noncompliance as well as narcotic abuse.  Per chart review, lives alone independent with assistive device prior to admission.  She was needing some assistance with ADLs since recent fall. She has boyfriend and parents who work during the day.  The patient with reported recent fall admitted July 26, 2016 to August 04, 2016, sustaining thoracic T7 fracture 18% to 19%, subtle compression deformity superior endplate of T8, and probable acute subacute fractures of T12. The patient received followup per Dr. Venita Lick of [facility] placed in a TLSO brace.  Discharged to home ambulating with assistive device and home health therapy is recommended.  Presented August 13, 2016, after missing several recent dialysis treatments due to reporting transportation issues and complaints of back pain.  Chest x- ray showed basilar atelectasis with Small right pleural effusion, appeared to be 50% compression fracture of what may represent  T12, new since previous thoracic scanning.  Findings of fluid overload, received emergent hemodialysis.  Elevated troponin 0.2 felt to be demand ischemia.  Physical and occupational therapy ongoing.  The patient was admitted for comprehensive rehab program.  PAST MEDICAL HISTORY:  See discharge diagnoses.  SOCIAL HISTORY:  Lives alone.  Boyfriend and parents in the area work. Functional status upon admission to rehab services was independent with assistive device for level surfaces.  Required use of a rolling walker for ambulation.  Reports her dad had carrier up and down stairs to get into her home.  Functional status was minimal assist, ambulate 20 feet with rolling walker, while in the hospital.  Minimal assist sit to stand; min to mod assist activities of daily living.  PHYSICAL EXAMINATION:  VITAL SIGNS:  Blood pressure 119/92, pulse 97, temperature 98, and respirations 18. GENERAL:  This was an alert female, in no acute distress. LUNGS:  Clear to auscultation without wheeze. CARDIAC:  Regular rate and rhythm.  No murmur. ABDOMEN:  Soft, nontender.  Good bowel sounds. MUSCULOSKELETAL:  TLSO back brace in place  fitting appropriately. Strength 4/5 upper extremities, proximal to distal 4-/5 hip flexors with pain inhibition, knee extension, and ankle dorsiflexion.  Plantar flexion 4/5.  REHABILITATION HOSPITAL COURSE:  The patient was admitted to inpatient rehab services with therapies initiated on a 3-hour daily basis, consisting of physical therapy,  occupational therapy, and rehabilitation nursing.  The following issues were addressed during the patient's rehabilitation stay.  Pertaining to Ms. Blackerby's thoracic compression fractures of T7-T8, TLSO back brace in place.  She would follow up with [facility], Dr. Shon BatonBrooks.  SCDs for DVT prophylaxis.  Chronic pain management with Naprosyn, oxycodone as needed every 4 hours, Lidoderm patch, and Robaxin as needed.  She was  using some Dilaudid for increased severe pain.  Fentanyl discontinued August 23, 2016.  The patient with noted history of narcotic abuse and received full counsel in regard to maintaining her schedule.  She continued on Zoloft for history of depression.  Emotional support provided.  End-stage renal disease with hemodialysis, as directed as well as placement of right femoral AV loop graft to continue to establish her dialysis.  She exhibited no other signs of fluid overload.  Acute on chronic anemia of 9.2, which was improved.  The patient with a long history of medical noncompliance again receiving full counseling in regard to maintaining her medical regimen.  The patient received weekly collaborative interdisciplinary team conferences to discuss estimated length of stay, family teaching, any barriers to her discharge.  Ambulated to and from the therapy gym for activity tolerance, supervision with minimal verbal cues.  Worked with seated, marching, and isometric hip adduction. Navigating stairs, supervision.  She could gather belongings for activities of daily living and homemaking.  Full family teaching was completed and planned to discharge home with ongoing therapies [facility].  DISCHARGE MEDICATIONS:  Calcitonin nasal spray 1 spray alternating nostrils daily; Rocaltrol 1 mcg p.o. Monday, Wednesday, and Friday dialysis; Sensipar 90 mg p.o. supper; Fosrenol 1000 mg p.o. t.i.d.; Lidoderm patch, change as directed; Naprosyn 500 mg p.o. b.i.d.; Zoloft 100 mg p.o. daily; oxycodone 10 mg p.o. every 4 hours as needed for pain, dispense of 30 tablets; Robaxin 500 mg p.o. every 6 hours as needed for muscle spasms.  DIET:  Renal diet.  FOLLOWUP:  She would follow up with Dr. Hyman HopesJegede, Medical Management; Dr. Allena KatzPatel as directed; Dr. Annie SableKellie Goldsborough, called for appointment; Dr. Venita Lickahari Brooks, Orthopedic Services.  SPECIAL INSTRUCTIONS:  Back brace when out of  bed.     Mariam Dollaraniel Meilin Brosh, P.A.   ______________________________ Maryla MorrowAnkit Patel, MD    DA/MEDQ  D:  09/01/2016  T:  09/01/2016  Job:  [id number]  cc:   Cecille AverKellie A. Goldsborough, M.D. Maryla MorrowAnkit Patel, MD Dr. Jennings BooksJegede Dahari D. Shon BatonBrooks, M.D.

## 2016-09-01 NOTE — Plan of Care (Signed)
Problem: RH PAIN MANAGEMENT Goal: RH STG PAIN MANAGED AT OR BELOW PT'S PAIN GOAL <3 on pain scale  Outcome: Adequate for Discharge Pain managed to 3 only due to chronic pain

## 2016-09-01 NOTE — Progress Notes (Signed)
[facility] PHYSICAL MEDICINE & REHABILITATION     PROGRESS NOTE    Subjective/Complaints: Pt seeing sitting up in bed about to begin therapies.  She has pain in her thigh and states she had trouble sleeping as a result.  She requests 1 time dilaudid to "get her going".  ROS: +right thigh pain. Denies CP, SOB, N/V/D.  Objective: Vital Signs: Blood pressure 96/86, pulse 94, temperature 98.2 F (36.8 C), temperature source Oral, resp. rate 17, height 5\' 3"  (1.6 m), weight 53.8 kg (118 lb 9.7 oz), SpO2 96 %. No results found. No results for input(s): WBC, HGB, HCT, PLT in the last 72 hours.  Recent Labs  08/31/16 0622  NA 135  K 4.1  CL 98*  GLUCOSE 81  BUN 34*  CREATININE 3.90*  CALCIUM 9.9   CBG (last 3)  No results for input(s): GLUCAP in the last 72 hours.  Wt Readings from Last 3 Encounters:  09/01/16 53.8 kg (118 lb 9.7 oz)  08/18/16 50.8 kg (111 lb 15.9 oz)  08/04/16 51.2 kg (112 lb 14 oz)    Physical Exam:  Constitutional: She appears well-developed. Small stature. NAD. HENT: Normocephalic and atraumatic.  Eyes: EOMI.  No draingage  Cardiovascular: RRR. No JVD. Respiratory: No respiratory distress. She has no wheezes. She has no rales. She exhibits no tenderness  GI: BS+. She exhibits no distension. There is no tenderness. There is no rebound.  Musculoskeletal: She exhibits no edema. No TTP. Neurological: She is alert and oriented  Strength 4+/5 UE prox to distal.  RLE: 4-/5 HF. KE (pain inhibition), ankle dorsi/plantar flexion 4+/5.  LLE: 4/5 HF. KE (pain inhibition), ankle dorsi/plantar flexion 4+/5.  Skin: No rash noted. No erythema. Warm and dry.  Assessment/Plan: 1. Functional and mobility deficits  secondary to thoracic compression fractures which require 3+ hours per day of interdisciplinary therapy in a comprehensive inpatient rehab setting. Physiatrist is providing close team supervision and 24 hour management of active medical problems listed  below. Physiatrist and rehab team continue to assess barriers to discharge/monitor patient progress toward functional and medical goals.  Function:  Bathing Bathing position   Position: Wheelchair/chair at sink  Bathing parts Body parts bathed by patient: Right arm, Left arm, Chest, Abdomen, Right upper leg, Left upper leg, Front perineal area, Buttocks, Right lower leg, Left lower leg, Back Body parts bathed by helper: Right lower leg, Left lower leg  Bathing assist Assist Level: Supervision or verbal cues      Upper Body Dressing/Undressing Upper body dressing   What is the patient wearing?: Pull over shirt/dress, Orthosis     Pull over shirt/dress - Perfomed by patient: Thread/unthread right sleeve, Thread/unthread left sleeve, Put head through opening, Pull shirt over trunk       Orthosis activity level: Performed by patient  Upper body assist Assist Level: Supervision or verbal cues   Set up : To obtain clothing/put away  Lower Body Dressing/Undressing Lower body dressing   What is the patient wearing?: Underwear, Pants, Shoes Underwear - Performed by patient: Thread/unthread right underwear leg, Thread/unthread left underwear leg, Pull underwear up/down   Pants- Performed by patient: Thread/unthread right pants leg, Thread/unthread left pants leg, Pull pants up/down   Non-skid slipper socks- Performed by patient: Don/doff right sock, Don/doff left sock   Socks - Performed by patient: Don/doff right sock, Don/doff left sock   Shoes - Performed by patient: Don/doff right shoe, Don/doff left shoe  Lower body assist Assist for lower body dressing: Touching or steadying assistance (Pt > 75%)      Toileting Toileting Toileting activity did not occur: No continent bowel/bladder event Toileting steps completed by patient: Adjust clothing prior to toileting, Performs perineal hygiene, Adjust clothing after toileting   Toileting Assistive Devices: Grab bar or  rail  Toileting assist Assist level: Supervision or verbal cues   Transfers Chair/bed transfer   Chair/bed transfer method: Stand pivot, Ambulatory Chair/bed transfer assist level: Supervision or verbal cues Chair/bed transfer assistive device: Armrests, Patent attorney     Max distance: 159ft Assist level: Supervision or verbal cues   Wheelchair   Type: Manual Max wheelchair distance: 125 ft Assist Level: Supervision or verbal cues  Cognition Comprehension Comprehension assist level: Follows complex conversation/direction with extra time/assistive device  Expression Expression assist level: Expresses complex 90% of the time/cues < 10% of the time  Social Interaction Social Interaction assist level: Interacts appropriately with others with medication or extra time (anti-anxiety, antidepressant).  Problem Solving Problem solving assist level: Solves complex problems: Recognizes & self-corrects  Memory Memory assist level: Complete Independence: No helper   Medical Problem List and Plan: 1.  Weakness and functional deficits due to thoracic compression fractures, T7-T8 secondary to recent fall and recent history of pathological pelvic fracture  Cont CIR, plan for d/c tomorrow  Will see pt for transitional care management in 1-2 weeks  -Back brace when out of bed  -orthostatic negative             -continue therapies 2.  DVT Prophylaxis/Anticoagulation:   Vascular study showing chronic DVT  Pt seen by Heme/Onc (appreciate recs), no need for anticoagulation at this time.  Further per Nephro, due to pt's noncompliance, at risk for bleed.   3. Pain Management with a history of narcotic abuse:   Naprosyn 500 mg BID  Oxycodone PRN, changed to 10mg  q4 PRN  Lidoderm patch added 11/3, d/ced per pt  1 time IV dilaudid on 11/10 for right access pain  Robaxin as needed  Dilaudid for more severe pain, d/ced on 10/30  D/ced fentanyl 11/1 4. Mood: Zoloft 100 mg daily.  Appreciate Neuropsych following.   5. Neuropsych: This patient is capable of making decisions on her own behalf. 6. Skin/Wound Care: Routine skin checks 7. 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 Acute on chronic anemia.   Hb 9.2 on 11/6 (improving)  Hemocult stools positive  Cont to monitor 11. Medical noncompliance. Counseling 12. Severe Osteoporosis  With pathological fractures  Cont current treatment 13. Labile BP  Likely related to HD, recs per Nephro  Stable on 11/9, slightly hypotensive, but asymptomatic - will monitor with IV pain med   LOS (Days) 14 A FACE TO FACE EVALUATION WAS PERFORMED  Ronalee Scheunemann Karis Juba 09/01/2016 8:10 AM

## 2016-09-01 NOTE — Progress Notes (Signed)
Physical Therapy Discharge Summary  Patient Details  Name: [name] Small MRN: [id number] Date of Birth: [birth year]  Today's Date: 09/01/2016 PT Individual Time: 9485-4627 PT Individual Time Calculation (min): 80 min    Patient has met 7 of 7 long term goals due to improved activity tolerance, improved balance, improved postural control, increased strength and decreased pain.  Patient to discharge at an ambulatory level Supervision.   Patient's care partner is independent to provide the necessary supervision assistance at discharge.  Reasons goals not met: All goals met  Recommendation:  Patient will benefit from ongoing skilled PT services in home health setting to continue to advance safe functional mobility, address ongoing impairments in UE and LE weakness, pain and decreased ROM, impaired core strength and postural control, impaired balance, impaired activity tolerance/endurance, impaired gait, and minimize fall risk.  Equipment: Rollator  Reasons for discharge: treatment goals met and discharge from hospital  Patient/family agrees with progress made and goals achieved: Yes  PT Discharge Pt received seated EOB finishing lunch; reporting continued pain in R thigh but tolerable this pm.  Performed re-assessment of LE strength and sensation.  Pt donned TLSO EOB and performed sit > stand from bed with rollator Mod I but with increased pain in RLE.  Performed gait x 150' in controlled environment with rollator and supervision as documented below with supervision due to pain in RLE.  Performed elevated car transfer to simulate tall truck and van transfer for home with pt performing with min A to lift RLE into car due to increased pain.  Pt reporting 8/10 pain in RLE after car transfer and requesting pain medication; RN notified.  After pt received medication attempted stair negotiation to simulate parent's home entry/exit; pt was able to negotiate 4 stairs a couple of days ago but unable to  negotiate today even with two rails due to pain in RLE; required mod A for one step.  Returned to room with rollator and transferred back to supine in bed Mod I.  Performed supine exercises for upper back, shoulder and neck ROM and strengthening exercises with 8 reps shoulder presses, head presses and exercises with theraband.  At end of session pt returned to sitting EOB.  Family not present for education today but pt reports they have been present last weekend and will be able to assist her.   Vital Signs Therapy Vitals Temp: 98.3 F (36.8 C) Temp Source: Oral Pulse Rate: (!) 103 Resp: 18 BP: (!) 110/54 Patient Position (if appropriate): Sitting Oxygen Therapy SpO2: 98 % O2 Device: Not Delivered Pain Pain Assessment Pain Score: 8  Pain Type: Acute pain Pain Location: Leg Pain Orientation: Right Pain Descriptors / Indicators: Sharp Pain Onset: With Activity Pain Intervention(s): Rest;RN made aware Sensation Sensation Light Touch: Impaired Detail Light Touch Impaired Details: Impaired RLE Stereognosis: Not tested Hot/Cold: Not tested Proprioception: Appears Intact Coordination Gross Motor Movements are Fluid and Coordinated: No Motor  Motor Motor: Other (comment) Motor - Discharge Observations: Generalized weakness  Mobility Bed Mobility Bed Mobility: Supine to Sit;Sit to Supine Supine to Sit: 6: Modified independent (Device/Increase time);HOB flat Sit to Supine: 6: Modified independent (Device/Increase time);HOB flat Transfers Transfers: Yes Stand Pivot Transfers: 5: Supervision;With armrests Stand Pivot Transfer Details (indicate cue type and reason): With TLSO donned and UE support on rollator Locomotion  Ambulation Ambulation/Gait Assistance: 5: Supervision Ambulation Distance (Feet): 150 Feet Assistive device: 4-wheeled walker Gait Gait: Yes Gait Pattern: Impaired Gait Pattern: Step-through pattern;Decreased step length - right;Decreased step length -  left;Decreased stride length;Decreased hip/knee flexion - right;Decreased hip/knee flexion - left;Antalgic;Decreased trunk rotation;Trunk flexed;Narrow base of support Stairs / Additional Locomotion Stairs: Yes Stairs Assistance: 3: Mod assist Stairs Assistance Details (indicate cue type and reason): Performed up/down one step forwards to ascend, backwards to descend with bilat UE support on rails and mod A to advance COG forwards due to increased pain in RLE after graft placement Stair Management Technique: Two rails;Step to pattern;Forwards;Backwards Number of Stairs: 1 Height of Stairs: 5 Wheelchair Mobility Wheelchair Mobility: No  Trunk/Postural Assessment  Cervical Assessment Cervical Assessment: Within Functional Limits Thoracic Assessment Thoracic Assessment: Exceptions to [facility] (TLSO in place) Lumbar Assessment Lumbar Assessment: Exceptions to [facility] (posterior tilt) Postural Control Postural Control: Deficits on evaluation  Balance Static Sitting Balance Static Sitting - Balance Support: No upper extremity supported Static Sitting - Level of Assistance: 6: Modified independent (Device/Increase time) Dynamic Sitting Balance Dynamic Sitting - Balance Support: No upper extremity supported Dynamic Sitting - Level of Assistance: 6: Modified independent (Device/Increase time) Static Standing Balance Static Standing - Balance Support: Bilateral upper extremity supported Static Standing - Level of Assistance: 5: Stand by assistance Dynamic Standing Balance Dynamic Standing - Balance Support: Right upper extremity supported;Left upper extremity supported Dynamic Standing - Level of Assistance: 5: Stand by assistance Extremity Assessment      RLE Assessment RLE Assessment: Exceptions to [facility] RLE Strength RLE Overall Strength: Deficits;Due to pain (1/5 hip flexion, 3/5 knee flexion/extension) LLE Assessment LLE Assessment: Exceptions to [facility] LLE Strength LLE Overall Strength:  Deficits;Due to pain (2/5 hip flexion, 4/5 knee flexion/extension)   See Function Navigator for Current Functional Status.  Raylene Everts [place] 09/01/2016, 3:39 PM

## 2016-09-02 ENCOUNTER — Inpatient Hospital Stay (HOSPITAL_COMMUNITY): Payer: Medicare Other

## 2016-09-02 ENCOUNTER — Inpatient Hospital Stay: Admit: 2016-09-02 | Payer: Medicare Other | Admitting: Vascular Surgery

## 2016-09-02 ENCOUNTER — Inpatient Hospital Stay (HOSPITAL_COMMUNITY): Payer: Medicare Other | Admitting: Certified Registered Nurse Anesthetist

## 2016-09-02 ENCOUNTER — Encounter (HOSPITAL_COMMUNITY)
Admission: RE | Disposition: A | Discharge status: Home Health | Payer: Self-pay | Source: Intra-hospital | Attending: Physical Medicine & Rehabilitation

## 2016-09-02 HISTORY — PX: INSERTION OF DIALYSIS CATHETER: SHX1324

## 2016-09-02 LAB — RENAL FUNCTION PANEL
Albumin: 3.2 g/dL — ABNORMAL LOW (ref 3.5–5.0)
Anion gap: 16 — ABNORMAL HIGH (ref 5–15)
BUN: 70 mg/dL — ABNORMAL HIGH (ref 6–20)
CALCIUM: 10.2 mg/dL (ref 8.9–10.3)
CO2: 22 mmol/L (ref 22–32)
Chloride: 97 mmol/L — ABNORMAL LOW (ref 101–111)
Creatinine, Ser: 6.87 mg/dL — ABNORMAL HIGH (ref 0.44–1.00)
GFR, EST AFRICAN AMERICAN: 9 mL/min — AB (ref >60)
GFR, EST NON AFRICAN AMERICAN: 8 mL/min — AB (ref >60)
Glucose, Bld: 94 mg/dL (ref 65–99)
Phosphorus: 6.8 mg/dL — ABNORMAL HIGH (ref 2.5–4.6)
Potassium: 5 mmol/L (ref 3.5–5.1)
SODIUM: 135 mmol/L (ref 135–145)

## 2016-09-02 LAB — CBC
HCT: 27.9 % — ABNORMAL LOW (ref 36.0–46.0)
Hemoglobin: 8.3 g/dL — ABNORMAL LOW (ref 12.0–15.0)
MCH: 27.2 pg (ref 26.0–34.0)
MCHC: 29.7 g/dL — ABNORMAL LOW (ref 30.0–36.0)
MCV: 91.5 fL (ref 78.0–100.0)
PLATELETS: 248 10*3/uL (ref 150–400)
RBC: 3.05 MIL/uL — AB (ref 3.87–5.11)
RDW: 23.6 % — ABNORMAL HIGH (ref 11.5–15.5)
WBC: 8.3 10*3/uL (ref 4.0–10.5)

## 2016-09-02 SURGERY — INSERTION OF DIALYSIS CATHETER
Anesthesia: Monitor Anesthesia Care | Laterality: Left

## 2016-09-02 MED ORDER — FENTANYL CITRATE (PF) 100 MCG/2ML IJ SOLN
25.0000 ug | INTRAMUSCULAR | Status: DC | PRN
  Administered 2016-09-02: 50 ug via INTRAVENOUS

## 2016-09-02 MED ORDER — HEPARIN SODIUM (PORCINE) 1000 UNIT/ML IJ SOLN
INTRAMUSCULAR | Status: DC | PRN
  Administered 2016-09-02: 4.7 mL

## 2016-09-02 MED ORDER — MIDAZOLAM HCL 2 MG/2ML IJ SOLN
INTRAMUSCULAR | Status: AC
  Filled 2016-09-02: qty 2

## 2016-09-02 MED ORDER — FENTANYL CITRATE (PF) 100 MCG/2ML IJ SOLN
INTRAMUSCULAR | Status: DC | PRN
  Administered 2016-09-02: 100 ug via INTRAVENOUS

## 2016-09-02 MED ORDER — MIDAZOLAM HCL 5 MG/5ML IJ SOLN
INTRAMUSCULAR | Status: DC | PRN
  Administered 2016-09-02: 2 mg via INTRAVENOUS

## 2016-09-02 MED ORDER — LIDOCAINE HCL (PF) 1 % IJ SOLN
INTRAMUSCULAR | Status: DC | PRN
  Administered 2016-09-02: 10 mL

## 2016-09-02 MED ORDER — 0.9 % SODIUM CHLORIDE (POUR BTL) OPTIME
TOPICAL | Status: DC | PRN
  Administered 2016-09-02: 1000 mL

## 2016-09-02 MED ORDER — PROMETHAZINE HCL 25 MG/ML IJ SOLN: 6.2500 mg | INTRAMUSCULAR | Status: DC | PRN

## 2016-09-02 MED ORDER — PROPOFOL 10 MG/ML IV BOLUS
INTRAVENOUS | Status: AC
  Filled 2016-09-02: qty 20

## 2016-09-02 MED ORDER — HEPARIN SODIUM (PORCINE) 5000 UNIT/ML IJ SOLN
INTRAMUSCULAR | Status: DC | PRN
  Administered 2016-09-02: 500 mL

## 2016-09-02 MED ORDER — HEPARIN SODIUM (PORCINE) 1000 UNIT/ML DIALYSIS
20.0000 [IU]/kg | INTRAMUSCULAR | Status: DC | PRN
  Filled 2016-09-02: qty 2

## 2016-09-02 MED ORDER — HYDROMORPHONE HCL 1 MG/ML IJ SOLN
INTRAMUSCULAR | Status: AC
  Filled 2016-09-02: qty 1

## 2016-09-02 MED ORDER — PROPOFOL 10 MG/ML IV BOLUS
INTRAVENOUS | Status: DC | PRN
  Administered 2016-09-02 (×5): 20 mg via INTRAVENOUS

## 2016-09-02 MED ORDER — FENTANYL CITRATE (PF) 100 MCG/2ML IJ SOLN
INTRAMUSCULAR | Status: AC
  Filled 2016-09-02: qty 2

## 2016-09-02 MED ORDER — SODIUM CHLORIDE 0.9 % IV SOLN
INTRAVENOUS | Status: DC
  Administered 2016-09-02: 09:00:00 via INTRAVENOUS

## 2016-09-02 MED ORDER — HYDROMORPHONE HCL 1 MG/ML IJ SOLN
1.0000 mg | Freq: Once | INTRAMUSCULAR | Status: AC
  Administered 2016-09-02: 1 mg via INTRAVENOUS

## 2016-09-02 MED ORDER — LIDOCAINE HCL (CARDIAC) 20 MG/ML IV SOLN
INTRAVENOUS | Status: DC | PRN
  Administered 2016-09-02: 30 mg via INTRATRACHEAL

## 2016-09-02 SURGICAL SUPPLY — 38 items
BAG DECANTER FOR FLEXI CONT (MISCELLANEOUS) ×3 IMPLANT
BIOPATCH BLUE 3/4IN DISK W/1.5 (GAUZE/BANDAGES/DRESSINGS) ×3 IMPLANT
BIOPATCH RED 1 DISK 7.0 (GAUZE/BANDAGES/DRESSINGS) ×2 IMPLANT
BIOPATCH RED 1IN DISK 7.0MM (GAUZE/BANDAGES/DRESSINGS) ×1
CATH PALINDROME RT-P 15FX19CM (CATHETERS) IMPLANT
CATH PALINDROME RT-P 15FX23CM (CATHETERS) ×3 IMPLANT
CATH PALINDROME RT-P 15FX28CM (CATHETERS) IMPLANT
CATH PALINDROME RT-P 15FX55CM (CATHETERS) IMPLANT
COVER PROBE W GEL 5X96 (DRAPES) ×3 IMPLANT
COVER SURGICAL LIGHT HANDLE (MISCELLANEOUS) ×3 IMPLANT
DERMABOND ADVANCED (GAUZE/BANDAGES/DRESSINGS) ×2
DERMABOND ADVANCED .7 DNX12 (GAUZE/BANDAGES/DRESSINGS) ×1 IMPLANT
DRAPE C-ARM 42X72 X-RAY (DRAPES) ×3 IMPLANT
DRAPE CHEST BREAST 15X10 FENES (DRAPES) ×3 IMPLANT
GAUZE SPONGE 2X2 8PLY STRL LF (GAUZE/BANDAGES/DRESSINGS) ×1 IMPLANT
GAUZE SPONGE 4X4 16PLY XRAY LF (GAUZE/BANDAGES/DRESSINGS) ×3 IMPLANT
GLOVE BIO SURGEON STRL SZ7.5 (GLOVE) ×3 IMPLANT
GOWN STRL REUS W/ TWL LRG LVL3 (GOWN DISPOSABLE) ×1 IMPLANT
GOWN STRL REUS W/ TWL XL LVL3 (GOWN DISPOSABLE) ×1 IMPLANT
GOWN STRL REUS W/TWL LRG LVL3 (GOWN DISPOSABLE) ×2
GOWN STRL REUS W/TWL XL LVL3 (GOWN DISPOSABLE) ×2
KIT BASIN OR (CUSTOM PROCEDURE TRAY) ×3 IMPLANT
KIT ROOM TURNOVER OR (KITS) ×3 IMPLANT
NEEDLE 18GX1X1/2 (RX/OR ONLY) (NEEDLE) ×3 IMPLANT
NEEDLE HYPO 25GX1X1/2 BEV (NEEDLE) ×3 IMPLANT
NS IRRIG 1000ML POUR BTL (IV SOLUTION) ×3 IMPLANT
PACK SURGICAL SETUP 50X90 (CUSTOM PROCEDURE TRAY) IMPLANT
PAD ARMBOARD 7.5X6 YLW CONV (MISCELLANEOUS) ×6 IMPLANT
SOAP 2 % CHG 4 OZ (WOUND CARE) ×3 IMPLANT
SPONGE GAUZE 2X2 STER 10/PKG (GAUZE/BANDAGES/DRESSINGS) ×2
SUT ETHILON 3 0 PS 1 (SUTURE) ×3 IMPLANT
SUT MNCRL AB 4-0 PS2 18 (SUTURE) ×3 IMPLANT
SYR 20CC LL (SYRINGE) ×6 IMPLANT
SYR 5ML LL (SYRINGE) ×3 IMPLANT
SYR CONTROL 10ML LL (SYRINGE) ×3 IMPLANT
SYRINGE 10CC LL (SYRINGE) ×3 IMPLANT
TAPE CLOTH SURG 4X10 WHT LF (GAUZE/BANDAGES/DRESSINGS) ×3 IMPLANT
WATER STERILE IRR 1000ML POUR (IV SOLUTION) ×3 IMPLANT

## 2016-09-02 NOTE — Progress Notes (Addendum)
PHYSICAL MEDICINE & REHABILITATION     PROGRESS NOTE    Subjective/Complaints: Pt states cuff is migrated out, pt doesn't recall exactly when this happened HD tech reportedly notified nephro Not using R thigh AVG yet Next HD Monday Burning pain R thigh pt request dilaudid ROS: +right thigh pain. Denies CP, SOB, N/V/D.  Objective: Vital Signs: Blood pressure (!) 108/56, pulse (!) 102, temperature 99 F (37.2 C), temperature source Oral, resp. rate 16, height 5\' 3"  (1.6 m), weight 50.2 kg (110 lb 9.6 oz), SpO2 100 %. No results found.  Recent Labs  09/02/16 0007  WBC 8.3  HGB 8.3*  HCT 27.9*  PLT 248    Recent Labs  08/31/16 0622 09/02/16 0007  NA 135 135  K 4.1 5.0  CL 98* 97*  GLUCOSE 81 94  BUN 34* 70*  CREATININE 3.90* 6.87*  CALCIUM 9.9 10.2   CBG (last 3)  No results for input(s): GLUCAP in the last 72 hours.  Wt Readings from Last 3 Encounters:  09/02/16 50.2 kg (110 lb 9.6 oz)  08/18/16 50.8 kg (111 lb 15.9 oz)  08/04/16 51.2 kg (112 lb 14 oz)    Physical Exam:  Constitutional: She appears well-developed. Small stature. NAD. HENT: Normocephalic and atraumatic.  Eyes: EOMI.  No draingage  Cardiovascular: RRR. No JVD. Respiratory: No respiratory distress. She has no wheezes. She has no rales. She exhibits no tenderness  GI: BS+. She exhibits no distension. There is no tenderness. There is no rebound.  Musculoskeletal: She exhibits no edema. No TTP. Neurological: She is alert and oriented  Strength 4+/5 UE prox to distal.  RLE: 4-/5 HF. KE (pain inhibition), ankle dorsi/plantar flexion 4+/5.  LLE: 4/5 HF. KE (pain inhibition), ankle dorsi/plantar flexion 4+/5.  Skin: No rash noted. No erythema. Warm and dry. Right thigh no erythema Left thigh has HD cath cuff is ~10cmdistal to skin entry site Assessment/Plan: 1. Functional and mobility deficits  secondary to thoracic compression fractures  Plan to D/C today after checking with VVS  regarding cath site See D/C summ Function:  Bathing Bathing position   Position: Sitting EOB (Simulated)  Bathing parts Body parts bathed by patient: Right arm, Left arm, Chest, Abdomen, Right upper leg, Left upper leg, Front perineal area, Buttocks, Right lower leg, Left lower leg, Back Body parts bathed by helper: Right lower leg, Left lower leg  Bathing assist Assist Level: Supervision or verbal cues      Upper Body Dressing/Undressing Upper body dressing   What is the patient wearing?: Pull over shirt/dress, Orthosis     Pull over shirt/dress - Perfomed by patient: Thread/unthread right sleeve, Thread/unthread left sleeve, Put head through opening, Pull shirt over trunk       Orthosis activity level: Performed by patient  Upper body assist Assist Level: More than reasonable time   Set up : To obtain clothing/put away  Lower Body Dressing/Undressing Lower body dressing   What is the patient wearing?: Pants, Non-skid slipper socks, Underwear Underwear - Performed by patient: Thread/unthread right underwear leg, Thread/unthread left underwear leg, Pull underwear up/down   Pants- Performed by patient: Thread/unthread right pants leg, Thread/unthread left pants leg, Pull pants up/down   Non-skid slipper socks- Performed by patient: Don/doff right sock, Don/doff left sock   Socks - Performed by patient: Don/doff right sock, Don/doff left sock   Shoes - Performed by patient: Don/doff right shoe, Don/doff left shoe  Lower body assist Assist for lower body dressing: Supervision or verbal cues      Toileting Toileting Toileting activity did not occur: No continent bowel/bladder event Toileting steps completed by patient: Adjust clothing prior to toileting, Performs perineal hygiene, Adjust clothing after toileting (per nursing report)   Toileting Assistive Devices: Grab bar or rail  Toileting assist Assist level: Supervision or verbal cues    Transfers Chair/bed transfer   Chair/bed transfer method: Stand pivot Chair/bed transfer assist level: Supervision or verbal cues Chair/bed transfer assistive device: Armrests, Environmental consultant, Orthosis     Locomotion Ambulation     Max distance: 150 Assist level: Supervision or verbal cues   Wheelchair Wheelchair activity did not occur: N/A Type: Manual Max wheelchair distance: 125 ft Assist Level: Supervision or verbal cues  Cognition Comprehension Comprehension assist level: Follows complex conversation/direction with extra time/assistive device  Expression Expression assist level: Expresses basic needs/ideas: With extra time/assistive device  Social Interaction Social Interaction assist level: Interacts appropriately with others with medication or extra time (anti-anxiety, antidepressant).  Problem Solving Problem solving assist level: Solves complex problems: With extra time  Memory Memory assist level: Complete Independence: No helper   Medical Problem List and Plan: 1.  Weakness and functional deficits due to thoracic compression fractures, T7-T8 secondary to recent fall and recent history of pathological pelvic fracture  Cont CIR, plan for d/c today, Needs Left fem access replaced spoke with VVS, Dr Randie Heinz, will make NPO for cath replacement, hopefully can go home after recovery today  Dr Allena Katz see pt for transitional care management in 1-2 weeks  -Back brace when out of bed  -orthostatic negative             -continue therapies 2.  DVT Prophylaxis/Anticoagulation:   Vascular study showing chronic DVT  Pt seen by Heme/Onc (appreciate recs), no need for anticoagulation at this time.  Further per Nephro, due to pt's noncompliance, at risk for bleed.   3. Pain Management with a history of narcotic abuse:   Naprosyn 500 mg BID  Oxycodone PRN, changed to 10mg  q4 PRN  Lidoderm patch added 11/3, d/ced per pt  1 time IV dilaudid on 11/11 for right access pain  Robaxin as  needed  Dilaudid for more severe pain, d/ced on 10/30  D/ced fentanyl 11/1 4. Mood: Zoloft 100 mg daily. Appreciate Neuropsych following.   5. Neuropsych: This patient is capable of making decisions on her own behalf. 6. Skin/Wound Care: Routine skin checks 7. 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 Acute on chronic anemia.   Hb 9.2 on 11/6 (improving)  Hemocult stools positive  Cont to monitor 11. Medical noncompliance. Counseling 12. Severe Osteoporosis  With pathological fractures  Cont current treatment 13. Labile BP  Likely related to HD, recs per Nephro  Ok this am  LOS (Days) 15 A FACE TO FACE EVALUATION WAS PERFORMED  Erick Colace 09/02/2016 6:17 AM

## 2016-09-02 NOTE — Op Note (Signed)
    OPERATIVE NOTE  PROCEDURE: 1. left femoral vein tunneled dialysis catheter placement 2. leftt femoral vein cannulation under ultrasound guidance  PRE-OPERATIVE DIAGNOSIS: end-stage renal failure  POST-OPERATIVE DIAGNOSIS: same as above  SURGEON: Erleen Egner C. Randie Heinz, MD  ANESTHESIA: local and MAC  ESTIMATED BLOOD LOSS: 30 cc  FINDING(S): 1.  Tips of the catheter inferior to existing ivc filter   SPECIMEN(S):  none  INDICATIONS:   [name] Small is a 25 y.o. female with end-stage renal disease and has recent placement of right thigh AV graft. She currently dialyzes via left groin tunneled catheter and this is noted to have withdrawn with cuff exposed. She is therefore indicated for exchange versus placement of new catheter.  DESCRIPTION: After written full informed consent was obtained from the patient, the patient was taken back to the operating room and placed supine on the operating table Mac anesthesia induced. She was sterilely prepped and draped in the usual fashion of the left groin. Timeout was called. Using ultrasound guidance identified the left femoral vein this was cannulated with 18-gauge needle and wire passed easily up to the level of the IVC filter under fluoroscopy. I then serially dilated the tract and placed the introducer. Counterincision was made and a tunneler placed between the 2 incisions. I then introduced 23 cm catheter and peeled away the sheath. Catheter tip was just inferior to the IVC filter. Catheter was then tunneled trimmed to size.  The ports were docked onto the two lumens.  The catheter collar was then snapped into place.  Each port was tested by aspirating and flushing.  No resistance was noted.  Each port was then thoroughly flushed with heparinized saline.  The catheter was secured in placed with 3-0 Nylon tied to the catheter.  The groin incision was closed with a U-stitch of 4-0 Monocryl.   Each port was then loaded with concentrated heparin and  sterile caps were applied.  The patient tolerated the procedure quite well without any immediate competitions.     Shaheen Star C. Randie Heinz, MD [facility] Office: [phone] Pager: [phone]   09/02/2016, 9:45 AM

## 2016-09-02 NOTE — Transfer of Care (Signed)
Immediate Anesthesia Transfer of Care Note  Patient: Brizia Holtgrewe  Procedure(s) Performed: Procedure(s): Insertion of Dialysis Catheter Left Femoral Vein (Left)  Patient Location: PACU  Anesthesia Type:MAC  Level of Consciousness: awake, alert  and oriented  Airway & Oxygen Therapy: Patient Spontanous Breathing and Patient connected to nasal cannula oxygen  Post-op Assessment: Report given to RN, Post -op Vital signs reviewed and stable and Patient moving all extremities X 4  Post vital signs: Reviewed and stable  Last Vitals:  Vitals:   09/02/16 0412 09/02/16 0955  BP: (!) 108/56   Pulse: (!) 102   Resp:    Temp: 37.2 C 36.3 C    Last Pain:  Vitals:   09/02/16 0700  TempSrc:   PainSc: 7       Patients Stated Pain Goal: 2 (08/31/16 1448)  Complications: No apparent anesthesia complications

## 2016-09-02 NOTE — Progress Notes (Signed)
  Progress Note    09/02/2016 8:18 AM 2 Days Post-Op  Subjective:  No pain this a.m.  Vitals:   09/02/16 0315 09/02/16 0412  BP: (!) 95/56 (!) 108/56  Pulse: (!) 104 (!) 102  Resp: 16   Temp: 98.3 F (36.8 C) 99 F (37.2 C)    Physical Exam: Awake and alert Abdomen is soft R femoral graft with pulsatility, R foot is warm Left femoral tdc with cuff exposed  CBC    Component Value Date/Time   WBC 8.3 09/02/2016 0007   RBC 3.05 (L) 09/02/2016 0007   HGB 8.3 (L) 09/02/2016 0007   HCT 27.9 (L) 09/02/2016 0007   PLT 248 09/02/2016 0007   MCV 91.5 09/02/2016 0007   MCH 27.2 09/02/2016 0007   MCHC 29.7 (L) 09/02/2016 0007   RDW 23.6 (H) 09/02/2016 0007   LYMPHSABS 1.0 07/26/2016 1803   MONOABS 0.6 07/26/2016 1803   EOSABS 0.0 07/26/2016 1803   BASOSABS 0.0 07/26/2016 1803    BMET    Component Value Date/Time   NA 135 09/02/2016 0007   K 5.0 09/02/2016 0007   CL 97 (L) 09/02/2016 0007   CO2 22 09/02/2016 0007   GLUCOSE 94 09/02/2016 0007   BUN 70 (H) 09/02/2016 0007   CREATININE 6.87 (H) 09/02/2016 0007   CALCIUM 10.2 09/02/2016 0007   GFRNONAA 8 (L) 09/02/2016 0007   GFRAA 9 (L) 09/02/2016 0007    INR    Component Value Date/Time   INR 1.35 06/17/2016 0229     Intake/Output Summary (Last 24 hours) at 09/02/16 0818 Last data filed at 09/02/16 0315  Gross per 24 hour  Intake              720 ml  Output             2000 ml  Net            -1280 ml     Assessment:  25 y.o. female is s/p right femoral loop av graft, tdc on left has exposed cuff and needs exchanged/changed.  Plan: To OR this a.m for new tunneled catheter in left groin. Patient demonstrates good understanding.   Zillah Alexie C. Randie Heinz, MD [facility] Office: [phone] Pager: [phone]  09/02/2016 8:18 AM

## 2016-09-02 NOTE — Progress Notes (Signed)
Patient back from procedure about 1100. Family at bedside. Patient and family denied any questions about discharge instructions. [name] Ricks PA gave instructions Friday. Patient reporting pain to BLE and oxy IR 10mg  given. Patient denies any nausea or dizziness. Patient vitals stable. Patient reports some relief with pain medication. IV Lt arm removed. Patient discharged with family and all belongings about 55. Lt catheter dressing clean, dry and intact. Foot warm,2 pulse.

## 2016-09-02 NOTE — Progress Notes (Signed)
Dr. Randie Heinz called. No xray needed post op verified

## 2016-09-02 NOTE — Anesthesia Postprocedure Evaluation (Signed)
Anesthesia Post Note  Patient: [name] Small  Procedure(s) Performed: Procedure(s) (LRB): Insertion of Dialysis Catheter Left Femoral Vein (Left)  Patient location during evaluation: PACU Anesthesia Type: MAC Level of consciousness: awake and alert Pain management: pain level controlled Vital Signs Assessment: post-procedure vital signs reviewed and stable Respiratory status: spontaneous breathing, nonlabored ventilation, respiratory function stable and patient connected to nasal cannula oxygen Cardiovascular status: stable and blood pressure returned to baseline Anesthetic complications: no    Last Vitals:  Vitals:   09/02/16 1042 09/02/16 1132  BP: (!) 98/54 110/62  Pulse: 100 (!) 105  Resp: 16 18  Temp: 36.9 C     Last Pain:  Vitals:   09/02/16 1057  TempSrc:   PainSc: 8                  Cecile Hearing

## 2016-09-02 NOTE — Anesthesia Procedure Notes (Signed)
Procedure Name: MAC Date/Time: 09/02/2016 9:12 AM Performed by: Marena Chancy Pre-anesthesia Checklist: Patient identified, Emergency Drugs available, Suction available, Patient being monitored and Timeout performed Patient Re-evaluated:Patient Re-evaluated prior to inductionOxygen Delivery Method: Nasal cannula

## 2016-09-02 NOTE — Anesthesia Preprocedure Evaluation (Addendum)
Anesthesia Evaluation  Patient identified by MRN, date of birth, ID band Patient awake    Reviewed: Allergy & Precautions, NPO status , Patient's Chart, lab work & pertinent test results, reviewed documented beta blocker date and time   History of Anesthesia Complications Negative for: history of anesthetic complications  Airway Mallampati: II  TM Distance: >3 FB Neck ROM: Full    Dental  (+) Dental Advisory Given   Pulmonary former smoker, PE   breath sounds clear to auscultation       Cardiovascular hypertension, Pt. on medications and Pt. on home beta blockers (-) angina+ Peripheral Vascular Disease, +CHF and + DVT  + Cardiac Defibrillator + Valvular Problems/Murmurs MR  Rhythm:Regular Rate:Normal  8/17 ECHO:  EF 20-25%, mod MR   Neuro/Psych PSYCHIATRIC DISORDERS Anxiety Depression negative neurological ROS     GI/Hepatic Neg liver ROS, GERD  Medicated and Controlled,  Endo/Other  negative endocrine ROS  Renal/GU ESRF and DialysisRenal disease (K+ 4.1)     Musculoskeletal   Abdominal   Peds  Hematology  (+) Blood dyscrasia, Sickle cell anemia and anemia ,   Anesthesia Other Findings   Reproductive/Obstetrics                             Anesthesia Physical  Anesthesia Plan  ASA: IV  Anesthesia Plan: MAC   Post-op Pain Management:    Induction: Intravenous  Airway Management Planned: Nasal Cannula  Additional Equipment:   Intra-op Plan:   Post-operative Plan:   Informed Consent: I have reviewed the patients History and Physical, chart, labs and discussed the procedure including the risks, benefits and alternatives for the proposed anesthesia with the patient or authorized representative who has indicated his/her understanding and acceptance.   Dental advisory given  Plan Discussed with: CRNA and Surgeon  Anesthesia Plan Comments: (Discussed risks/benefits/alternatives  to MAC sedation including need for ventilatory support, hypotension, need for conversion to general anesthesia.  All patient questions answered.  Patient/guardian wishes to proceed.)       Anesthesia Quick Evaluation

## 2016-09-03 ENCOUNTER — Encounter (HOSPITAL_COMMUNITY): Payer: Self-pay | Admitting: Vascular Surgery

## 2016-09-05 ENCOUNTER — Encounter: Payer: Self-pay | Admitting: Internal Medicine

## 2016-09-05 ENCOUNTER — Ambulatory Visit (INDEPENDENT_AMBULATORY_CARE_PROVIDER_SITE_OTHER): Payer: Medicare Other | Admitting: Internal Medicine

## 2016-09-05 ENCOUNTER — Other Ambulatory Visit: Payer: Self-pay | Admitting: *Deleted

## 2016-09-05 VITALS — BP 92/49 | HR 84 | Temp 98.4°F | Resp 18 | Ht 63.0 in | Wt 113.0 lb

## 2016-09-05 DIAGNOSIS — I5022 Chronic systolic (congestive) heart failure: Secondary | ICD-10-CM | POA: Diagnosis not present

## 2016-09-05 DIAGNOSIS — G894 Chronic pain syndrome: Secondary | ICD-10-CM | POA: Diagnosis not present

## 2016-09-05 DIAGNOSIS — Z992 Dependence on renal dialysis: Secondary | ICD-10-CM

## 2016-09-05 DIAGNOSIS — F411 Generalized anxiety disorder: Secondary | ICD-10-CM | POA: Diagnosis not present

## 2016-09-05 DIAGNOSIS — N186 End stage renal disease: Secondary | ICD-10-CM

## 2016-09-05 MED ORDER — GABAPENTIN 100 MG PO CAPS: 100.0000 mg | ORAL_CAPSULE | Freq: Two times a day (BID) | ORAL | 2 refills | Status: DC

## 2016-09-05 MED ORDER — LORAZEPAM 1 MG PO TABS: 1.0000 mg | ORAL_TABLET | Freq: Every day | ORAL | 1 refills | Status: DC

## 2016-09-05 MED ORDER — OXYCODONE HCL 10 MG PO TABS: 10.0000 mg | ORAL_TABLET | ORAL | 0 refills | Status: DC | PRN

## 2016-09-05 MED FILL — oxyCODONE HCL 10 MG TABS: 10 | 10 days supply | Qty: 60 | Fill #0

## 2016-09-05 MED FILL — LORazepam 1 MG TABS: 1 | 30 days supply | Qty: 30 | Fill #0

## 2016-09-05 NOTE — Patient Instructions (Signed)
End-Stage Kidney Disease °End-stage kidney disease occurs when the kidneys are so damaged that they cannot do their job. The kidneys are two organs that do many important jobs in the body, which include: °· Removing wastes and extra fluids from the blood. °· Making hormones that maintain the amount of fluid in your tissues and blood vessels. °· Maintaining the right amount of fluids and chemicals in the body. ° °When the kidneys are damaged and cannot do their job, life-threatening problems occur. Without the help of the kidneys, toxins build up in the blood. In end-stage kidney disease, the kidneys cannot get better. °What are the causes? °End-stage kidney disease usually occurs when a long-lasting (chronic) kidney disease gets worse. It may also occur after the kidneys are suddenly damaged (acute kidney injury). °What increases the risk? °This condition is more likely to develop in people who are: °· Older than age 60. °· Female. °· Of African-American descent. °· Current smokers or former smokers. °· Obese. ° °You may also have an increased risk for end-stage kidney disease if you: °· Have a family history of chronic kidney disease (CKD). °· Have had kidney disease for many years. °· Have other longstanding medical conditions that affect the kidneys, such as: °? Cardiovascular disease including high blood pressure. °? Diabetes. °? Certain diseases that affect the immune system. ° °What are the signs or symptoms? °· Swelling (edema) of the face, legs, ankles, or feet. °· Numbness, tingling, or loss of feeling (sensation) in your hands or feet. °· Tiredness (lethargy). °· Nausea or vomiting. °· Confusion, trouble concentrating, or loss of consciousness. °· Chest pain. °· Shortness of breath. °· Little to no urine production. °· Muscle twitches and cramps, especially in the legs. °· Constant itchiness. °· Loss of appetite. °· Pale skin and tissue lining your eyelids (conjunctiva). °· Headaches. °· Abnormally dark or  light skin. °· Decrease in muscle size (muscle wasting). °· Easy bruising. °· Frequent hiccups. °· Stopping of menstruation in women. °· Seizures. °How is this diagnosed? °Your health care provider will measure your blood pressure and do some tests. These may include: °· Urine tests. °· Blood tests. °· Imaging tests. °· A test in which a sample of tissue is removed from the kidneys to be looked at under a microscope (kidney biopsy). ° °How is this treated? °There are two treatments for end-stage kidney disease: °· A procedure that removes toxic wastes from the body (dialysis). Depending on the type of dialysis you choose, it may be performed more than one time a day (peritoneal dialysis) or several times a week (hemodialysis). °· Surgery to receive a new kidney (kidney transplant). ° °In addition to having dialysis or a kidney transplant, you may need to take medicines: °· To control high blood pressure (hypertension). °· To control cholesterol. °· To maintain healthy electrolyte levels in your blood. ° °You may also be given a specific diet to follow that includes requirements or limits for: °· Salt (sodium). °· Protein. °· Phosphorous. °· Potassium. °· Calcium. ° °Follow these instructions at home: °· Follow your prescribed diet. °· Take over-the-counter and prescription medicines only as told by your health care provider. °? Do not take any new medicines unless approved by your health care provider. Many medicines can worsen your kidney damage. °? Do not take any vitamin and mineral supplements unless approved by your health care provider. Many nutritional supplements can worsen your kidney damage. °? The dose of some medicines that you take may need to be   adjusted. °· Do not use any tobacco products, such as cigarettes, chewing tobacco, and e-cigarettes. If you need help quitting, ask your health care provider. °· Keep all follow-up visits as told by your health care provider. This is important. °· Keep track of  your blood pressure. Report changes in your blood pressure as told by your health care provider. °· Achieve and maintain a healthy weight. If you need help with this, ask your health care provider. °· Start or continue an exercise plan. Try to exercise at least 30 minutes a day, 5 days a week. °· Stay current with immunizations as told by your health care provider. °Where to find more information: °· American Association of Kidney Patients: www.aakp.org °· National Kidney Foundation: www.kidney.org °· [employer]: www.akfinc.org °· [employer] [facility]: www.lifeoptions.org and www.kidneyschool.org °Contact a health care provider if: °· Your symptoms get worse. °· You develop new symptoms. °Get help right away if: °· You have weakness in an arm or leg on one side of your body. °· You have difficulty speaking or you are slurring your speech. °· You have a sudden change in your vision. °· You have a sudden, severe headache. °· You have a sudden weight increase. °· You have difficulty breathing. °· Your symptoms suddenly get worse. °This information is not intended to replace advice given to you by your health care provider. Make sure you discuss any questions you have with your health care provider. °Document Released: 12/30/2003 Document Revised: 03/16/2016 Document Reviewed: 06/07/2012 °[employer] Interactive Patient Education © 2017 [employer] ° °

## 2016-09-05 NOTE — Progress Notes (Signed)
[name] Small, is a 25 y.o. female  FEX:[id number]  FMB:[id number]  DOB - 06-13-1991  Chief Complaint  Patient presents with  . Follow-up       Subjective:   [name] Small is a 25 y.o. female with multiple complex medical conditions including end-stage renal disease on hemodialysis, pulmonary embolism, congestive heart failure status post AICD placement, generalized anxiety disorder, chronic pain syndrome and hypertension here today for a hospital discharge follow up visit. Patient recently had a fall with thoracic compression fractures and prolonged hospital stay from October 4 to November 11. She subsequently transitioned to [facility] for Rehab where she was managed and reached modified independent level but activity was limited due to ongoing pain issues. Patient has multiple medical conditions are stable but she continues to be in pain, she is now wheelchair. 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 She is in need of refill of some of her medications today. She also wonders if she can transfer her care to a primary care physician around [facility] since she lives there now.   Problem  Generalized Anxiety Disorder  Chronic Systolic Heart Failure (Hcc)    ALLERGIES: Allergies  Allergen Reactions  . Aspirin Other (See Comments)    Interacts with Coreg  . Tramadol Anaphylaxis  . Vicodin [Hydrocodone-Acetaminophen] Hives  . Buprenorphine Hcl Itching    Ok with oxycodone  . Iohexol Itching  . Morphine And Related Itching and Other (See Comments)    Ok with oxycodone     PAST MEDICAL HISTORY: Past Medical History:  Diagnosis Date  . 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 Anemia   . Anxiety   . Cardiomyopathy   . Cellulitis and abscess of face 03/22/2013  . CHF (congestive heart failure) (HCC)   . Chronic  anticoagulation   . Chronic pain   . Depression   . DVT (deep venous thrombosis) (HCC) ~ 2014   BLE  . Dysrhythmia    at times per pt.  . 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 ESRD (end stage renal disease) on dialysis [facility])    "MWF; [facility]" (08/15/2016)  . Fracture, thoracic vertebra (HCC) 07/2016   "T7-T12"  . GERD (gastroesophageal reflux disease)   . H/O transfusion of packed red blood cells   . Heart murmur   . Hemodialysis patient (HCC)    Mon. Wed. Fri  . Hyperkalemia 09/2015  . Hypertension   . Narcotic abuse, continuous   . Osteoporosis   . Pelvic fracture (HCC) 06/2015   "on the right"  . Polycystic kidney disease   . Pulmonary emboli (HCC) 01/2012   Bilateral, moderate clot burden, areas of pulmonary infarction and central necrosis  . Renal insufficiency   . Sickle cell anemia (HCC)     MEDICATIONS AT HOME: Prior to Admission medications   Medication Sig Start Date End Date Taking? Authorizing Provider  calcitonin, salmon, (MIACALCIN) 200 UNIT/ACT nasal spray Place 1 spray into alternate nostrils daily. 09/01/16  Yes Daniel J Angiulli, PA-C  cinacalcet (SENSIPAR) 30 MG tablet Take 3 tablets (90 mg total) by mouth daily with supper. 09/01/16  Yes Daniel J Angiulli, PA-C  famotidine (PEPCID) 20 MG tablet Take 1 tablet (20 mg total) by mouth 2 (two) times daily as needed  for heartburn or indigestion. 09/01/16  Yes Daniel J Angiulli, PA-C  lanthanum (FOSRENOL) 1000 MG chewable tablet Chew 1 tablet (1,000 mg total) by mouth 3 (three) times daily with meals. 09/01/16  Yes Daniel J Angiulli, PA-C  lidocaine (LIDODERM) 5 % Place 1 patch onto the skin daily. Remove & Discard patch within 12 hours or as directed by MD 09/01/16  Yes Mcarthur Rossettianiel J Angiulli, PA-C  methocarbamol (ROBAXIN) 500 MG tablet Take 1 tablet (500 mg total) by mouth every 6 (six) hours as  needed for muscle spasms. 09/01/16  Yes Daniel J Angiulli, PA-C  multivitamin (RENA-VIT) TABS tablet Take 1 tablet by mouth daily. 09/01/16  Yes Daniel J Angiulli, PA-C  naproxen (NAPROSYN) 500 MG tablet Take 1 tablet (500 mg total) by mouth 2 (two) times daily with a meal. 09/01/16  Yes Daniel J Angiulli, PA-C  Oxycodone HCl 10 MG TABS Take 1 tablet (10 mg total) by mouth every 4 (four) hours as needed. 09/05/16  Yes Quentin Angstlugbemiga E Ayven Pheasant, MD  sertraline (ZOLOFT) 100 MG tablet Take 1 tablet (100 mg total) by mouth daily. 09/01/16  Yes Daniel J Angiulli, PA-C  calcitRIOL (ROCALTROL) 0.5 MCG capsule Take 2 capsules (1 mcg total) by mouth every Monday, Wednesday, and Friday with hemodialysis. 09/01/16   Mcarthur Rossettianiel J Angiulli, PA-C  LORazepam (ATIVAN) 1 MG tablet Take 1 tablet (1 mg total) by mouth at bedtime. 09/05/16   Quentin Angstlugbemiga E Tierra Thoma, MD    Objective:   Vitals:   09/05/16 0904  BP: (!) 92/49  Pulse: 84  Resp: 18  Temp: 98.4 F (36.9 C)  TempSrc: Oral  Weight: 113 lb (51.3 kg)  Height: 5\' 3"  (1.6 m)   Exam General appearance : Awake, alert, not in any distress. Speech Clear. Chronically ill looking, on wheelchair, round moon face HEENT: Atraumatic and Normocephalic, pupils equally reactive to light and accomodation Neck: Supple, no JVD. No cervical lymphadenopathy.  Chest: Trunk braces in situ, Good air entry bilaterally, no added sounds  CVS: S1 S2 regular, no murmurs.  Abdomen: Bowel sounds present, Non tender and not distended with no gaurding, rigidity or rebound. Extremities: B/L Lower Ext shows ++ edema, both legs are warm to touch, mildly tender bilaterally Neurology: Awake alert, and oriented X 3, CN II-XII intact, Non focal  Data Review Lab Results  Component Value Date   HGBA1C 5.5 06/17/2016   HGBA1C 5.8 (H) 01/10/2016   HGBA1C 5.8 (H) 04/03/2015    Assessment & Plan   1. ESRD on dialysis [facility](HCC)  - Continue Hemodialysis as scheduled - Patient has living donor, but  not on any Transplant list  2. Generalized anxiety disorder  - LORazepam (ATIVAN) 1 MG tablet; Take 1 tablet (1 mg total) by mouth at bedtime.  Dispense: 30 tablet; Refill: 1  3. Chronic pain syndrome  - Oxycodone HCl 10 MG TABS; Take 1 tablet (10 mg total) by mouth every 4 (four) hours as needed.  Dispense: 60 tablet; Refill: 0  Return in about 4 weeks (around 10/03/2016) for CKD/ESRD.  The patient was given clear instructions to go to ER or return to medical center if symptoms don't improve, worsen or new problems develop. The patient verbalized understanding. The patient was told to call to get lab results if they haven't heard anything in the next week.   This note has been created with Education officer, environmentalDragon speech recognition software and smart phrase technology. Any transcriptional errors are unintentional.    Aliany Fiorenza, MD, MHA, FACP, FAAP, CPE [facility] [place], [place] [phone]   09/05/2016, 9:27 AM

## 2016-09-05 NOTE — Progress Notes (Signed)
atient is here for FU  Patient complains of bilateral leg burning and back pain being present.  Patient request refills on Oxycodone and Ativan.

## 2016-09-06 ENCOUNTER — Other Ambulatory Visit: Payer: Self-pay | Admitting: *Deleted

## 2016-09-06 MED ORDER — FAMOTIDINE 20 MG PO TABS: 20.0000 mg | ORAL_TABLET | Freq: Two times a day (BID) | ORAL | 0 refills | Status: AC | PRN

## 2016-09-13 ENCOUNTER — Inpatient Hospital Stay (HOSPITAL_COMMUNITY)
Admission: EM | Admit: 2016-09-13 | Discharge: 2016-09-27 | DRG: 252 | Disposition: A | Discharge status: Hospice | Payer: Medicare Other | Attending: Internal Medicine | Admitting: Internal Medicine

## 2016-09-13 ENCOUNTER — Emergency Department (HOSPITAL_COMMUNITY): Payer: Medicare Other

## 2016-09-13 ENCOUNTER — Encounter (HOSPITAL_COMMUNITY): Payer: Self-pay | Admitting: *Deleted

## 2016-09-13 DIAGNOSIS — Z992 Dependence on renal dialysis: Secondary | ICD-10-CM

## 2016-09-13 DIAGNOSIS — D631 Anemia in chronic kidney disease: Secondary | ICD-10-CM | POA: Diagnosis present

## 2016-09-13 DIAGNOSIS — E860 Dehydration: Secondary | ICD-10-CM | POA: Diagnosis present

## 2016-09-13 DIAGNOSIS — R64 Cachexia: Secondary | ICD-10-CM | POA: Diagnosis present

## 2016-09-13 DIAGNOSIS — K219 Gastro-esophageal reflux disease without esophagitis: Secondary | ICD-10-CM | POA: Diagnosis present

## 2016-09-13 DIAGNOSIS — D571 Sickle-cell disease without crisis: Secondary | ICD-10-CM | POA: Diagnosis present

## 2016-09-13 DIAGNOSIS — F411 Generalized anxiety disorder: Secondary | ICD-10-CM | POA: Diagnosis present

## 2016-09-13 DIAGNOSIS — I428 Other cardiomyopathies: Secondary | ICD-10-CM

## 2016-09-13 DIAGNOSIS — Z7189 Other specified counseling: Secondary | ICD-10-CM

## 2016-09-13 DIAGNOSIS — L0390 Cellulitis, unspecified: Secondary | ICD-10-CM | POA: Diagnosis present

## 2016-09-13 DIAGNOSIS — I959 Hypotension, unspecified: Secondary | ICD-10-CM | POA: Diagnosis not present

## 2016-09-13 DIAGNOSIS — Z79899 Other long term (current) drug therapy: Secondary | ICD-10-CM

## 2016-09-13 DIAGNOSIS — E43 Unspecified severe protein-calorie malnutrition: Secondary | ICD-10-CM | POA: Diagnosis present

## 2016-09-13 DIAGNOSIS — E876 Hypokalemia: Secondary | ICD-10-CM | POA: Diagnosis not present

## 2016-09-13 DIAGNOSIS — T827XXA Infection and inflammatory reaction due to other cardiac and vascular devices, implants and grafts, initial encounter: Principal | ICD-10-CM | POA: Diagnosis present

## 2016-09-13 DIAGNOSIS — Z86718 Personal history of other venous thrombosis and embolism: Secondary | ICD-10-CM

## 2016-09-13 DIAGNOSIS — M79604 Pain in right leg: Secondary | ICD-10-CM | POA: Diagnosis not present

## 2016-09-13 DIAGNOSIS — N186 End stage renal disease: Secondary | ICD-10-CM

## 2016-09-13 DIAGNOSIS — Z9119 Patient's noncompliance with other medical treatment and regimen: Secondary | ICD-10-CM

## 2016-09-13 DIAGNOSIS — Z515 Encounter for palliative care: Secondary | ICD-10-CM

## 2016-09-13 DIAGNOSIS — Q782 Osteopetrosis: Secondary | ICD-10-CM

## 2016-09-13 DIAGNOSIS — D72829 Elevated white blood cell count, unspecified: Secondary | ICD-10-CM

## 2016-09-13 DIAGNOSIS — R600 Localized edema: Secondary | ICD-10-CM

## 2016-09-13 DIAGNOSIS — L03115 Cellulitis of right lower limb: Secondary | ICD-10-CM | POA: Diagnosis present

## 2016-09-13 DIAGNOSIS — I132 Hypertensive heart and chronic kidney disease with heart failure and with stage 5 chronic kidney disease, or end stage renal disease: Secondary | ICD-10-CM | POA: Diagnosis present

## 2016-09-13 DIAGNOSIS — T8612 Kidney transplant failure: Secondary | ICD-10-CM | POA: Diagnosis present

## 2016-09-13 DIAGNOSIS — Z86711 Personal history of pulmonary embolism: Secondary | ICD-10-CM

## 2016-09-13 DIAGNOSIS — I96 Gangrene, not elsewhere classified: Secondary | ICD-10-CM | POA: Diagnosis present

## 2016-09-13 DIAGNOSIS — N2581 Secondary hyperparathyroidism of renal origin: Secondary | ICD-10-CM | POA: Diagnosis present

## 2016-09-13 DIAGNOSIS — Z95810 Presence of automatic (implantable) cardiac defibrillator: Secondary | ICD-10-CM | POA: Diagnosis present

## 2016-09-13 DIAGNOSIS — I150 Renovascular hypertension: Secondary | ICD-10-CM | POA: Diagnosis not present

## 2016-09-13 DIAGNOSIS — I10 Essential (primary) hypertension: Secondary | ICD-10-CM | POA: Diagnosis present

## 2016-09-13 DIAGNOSIS — Z8249 Family history of ischemic heart disease and other diseases of the circulatory system: Secondary | ICD-10-CM

## 2016-09-13 DIAGNOSIS — Z681 Body mass index (BMI) 19 or less, adult: Secondary | ICD-10-CM

## 2016-09-13 DIAGNOSIS — Z885 Allergy status to narcotic agent status: Secondary | ICD-10-CM

## 2016-09-13 DIAGNOSIS — F3341 Major depressive disorder, recurrent, in partial remission: Secondary | ICD-10-CM | POA: Diagnosis present

## 2016-09-13 DIAGNOSIS — E8889 Other specified metabolic disorders: Secondary | ICD-10-CM | POA: Diagnosis present

## 2016-09-13 DIAGNOSIS — Z8271 Family history of polycystic kidney: Secondary | ICD-10-CM

## 2016-09-13 DIAGNOSIS — I5023 Acute on chronic systolic (congestive) heart failure: Secondary | ICD-10-CM | POA: Diagnosis present

## 2016-09-13 DIAGNOSIS — Z87891 Personal history of nicotine dependence: Secondary | ICD-10-CM

## 2016-09-13 DIAGNOSIS — L97919 Non-pressure chronic ulcer of unspecified part of right lower leg with unspecified severity: Secondary | ICD-10-CM | POA: Diagnosis present

## 2016-09-13 DIAGNOSIS — Z66 Do not resuscitate: Secondary | ICD-10-CM | POA: Diagnosis not present

## 2016-09-13 DIAGNOSIS — Z888 Allergy status to other drugs, medicaments and biological substances status: Secondary | ICD-10-CM

## 2016-09-13 DIAGNOSIS — W19XXXA Unspecified fall, initial encounter: Secondary | ICD-10-CM

## 2016-09-13 DIAGNOSIS — Y832 Surgical operation with anastomosis, bypass or graft as the cause of abnormal reaction of the patient, or of later complication, without mention of misadventure at the time of the procedure: Secondary | ICD-10-CM | POA: Diagnosis present

## 2016-09-13 DIAGNOSIS — G894 Chronic pain syndrome: Secondary | ICD-10-CM | POA: Diagnosis present

## 2016-09-13 DIAGNOSIS — R52 Pain, unspecified: Secondary | ICD-10-CM | POA: Diagnosis not present

## 2016-09-13 DIAGNOSIS — Z841 Family history of disorders of kidney and ureter: Secondary | ICD-10-CM

## 2016-09-13 DIAGNOSIS — Z886 Allergy status to analgesic agent status: Secondary | ICD-10-CM

## 2016-09-13 DIAGNOSIS — Z905 Acquired absence of kidney: Secondary | ICD-10-CM

## 2016-09-13 DIAGNOSIS — L97229 Non-pressure chronic ulcer of left calf with unspecified severity: Secondary | ICD-10-CM | POA: Diagnosis present

## 2016-09-13 DIAGNOSIS — M7989 Other specified soft tissue disorders: Secondary | ICD-10-CM

## 2016-09-13 DIAGNOSIS — Z720 Tobacco use: Secondary | ICD-10-CM

## 2016-09-13 DIAGNOSIS — F4323 Adjustment disorder with mixed anxiety and depressed mood: Secondary | ICD-10-CM

## 2016-09-13 DIAGNOSIS — I429 Cardiomyopathy, unspecified: Secondary | ICD-10-CM | POA: Diagnosis present

## 2016-09-13 LAB — CBC WITH DIFFERENTIAL/PLATELET
BASOS ABS: 0 10*3/uL (ref 0.0–0.1)
BASOS PCT: 0 %
EOS ABS: 0.1 10*3/uL (ref 0.0–0.7)
Eosinophils Relative: 1 %
HEMATOCRIT: 30.9 % — AB (ref 36.0–46.0)
HEMOGLOBIN: 9.6 g/dL — AB (ref 12.0–15.0)
LYMPHS ABS: 1.8 10*3/uL (ref 0.7–4.0)
LYMPHS PCT: 13 %
MCH: 28 pg (ref 26.0–34.0)
MCHC: 31.1 g/dL (ref 30.0–36.0)
MCV: 90.1 fL (ref 78.0–100.0)
Monocytes Absolute: 0.8 10*3/uL (ref 0.1–1.0)
Monocytes Relative: 6 %
NEUTROS ABS: 11.2 10*3/uL — AB (ref 1.7–7.7)
Neutrophils Relative %: 80 %
Platelets: 355 10*3/uL (ref 150–400)
RBC: 3.43 MIL/uL — ABNORMAL LOW (ref 3.87–5.11)
RDW: 22.9 % — AB (ref 11.5–15.5)
WBC: 13.9 10*3/uL — ABNORMAL HIGH (ref 4.0–10.5)

## 2016-09-13 LAB — BASIC METABOLIC PANEL
Anion gap: 19 — ABNORMAL HIGH (ref 5–15)
BUN: 40 mg/dL — AB (ref 6–20)
CALCIUM: 9.2 mg/dL (ref 8.9–10.3)
CHLORIDE: 95 mmol/L — AB (ref 101–111)
CO2: 26 mmol/L (ref 22–32)
CREATININE: 5.8 mg/dL — AB (ref 0.44–1.00)
GFR calc Af Amer: 11 mL/min — ABNORMAL LOW (ref >60)
GFR calc non Af Amer: 9 mL/min — ABNORMAL LOW (ref >60)
Glucose, Bld: 93 mg/dL (ref 65–99)
Potassium: 3.9 mmol/L (ref 3.5–5.1)
SODIUM: 140 mmol/L (ref 135–145)

## 2016-09-13 LAB — I-STAT CHEM 8, ED
BUN: 39 mg/dL — AB (ref 6–20)
CALCIUM ION: 1.02 mmol/L — AB (ref 1.15–1.40)
CREATININE: 5.9 mg/dL — AB (ref 0.44–1.00)
Chloride: 98 mmol/L — ABNORMAL LOW (ref 101–111)
GLUCOSE: 85 mg/dL (ref 65–99)
HEMATOCRIT: 36 % (ref 36.0–46.0)
HEMOGLOBIN: 12.2 g/dL (ref 12.0–15.0)
Potassium: 3.9 mmol/L (ref 3.5–5.1)
Sodium: 139 mmol/L (ref 135–145)
TCO2: 28 mmol/L (ref 0–100)

## 2016-09-13 LAB — I-STAT CG4 LACTIC ACID, ED: LACTIC ACID, VENOUS: 2.38 mmol/L — AB (ref 0.5–1.9)

## 2016-09-13 LAB — HCG, QUANTITATIVE, PREGNANCY: hCG, Beta Chain, Quant, S: 17 m[IU]/mL — ABNORMAL HIGH (ref <5)

## 2016-09-13 MED ORDER — ONDANSETRON HCL 4 MG/2ML IJ SOLN
4.0000 mg | Freq: Once | INTRAMUSCULAR | Status: AC
  Administered 2016-09-13: 4 mg via INTRAVENOUS
  Filled 2016-09-13: qty 2

## 2016-09-13 MED ORDER — DIPHENHYDRAMINE HCL 50 MG/ML IJ SOLN
50.0000 mg | Freq: Once | INTRAMUSCULAR | Status: AC
  Administered 2016-09-13: 50 mg via INTRAVENOUS
  Filled 2016-09-13: qty 1

## 2016-09-13 MED ORDER — PIPERACILLIN-TAZOBACTAM 3.375 G IVPB 30 MIN
3.3750 g | Freq: Once | INTRAVENOUS | Status: AC
  Administered 2016-09-13: 3.375 g via INTRAVENOUS
  Filled 2016-09-13: qty 50

## 2016-09-13 MED ORDER — HYDROMORPHONE HCL 2 MG/ML IJ SOLN
0.5000 mg | Freq: Once | INTRAMUSCULAR | Status: AC
  Administered 2016-09-13: 0.5 mg via INTRAVENOUS
  Filled 2016-09-13: qty 1

## 2016-09-13 MED ORDER — HYDROMORPHONE HCL 2 MG/ML IJ SOLN
1.0000 mg | Freq: Once | INTRAMUSCULAR | Status: AC
Start: 2016-09-13 — End: 2016-09-13
  Administered 2016-09-13: 1 mg via INTRAVENOUS
  Filled 2016-09-13: qty 1

## 2016-09-13 MED ORDER — VANCOMYCIN HCL IN DEXTROSE 1-5 GM/200ML-% IV SOLN
1000.0000 mg | Freq: Once | INTRAVENOUS | Status: AC
  Administered 2016-09-13: 1000 mg via INTRAVENOUS
  Filled 2016-09-13: qty 200

## 2016-09-13 MED ORDER — HYDROCORTISONE NA SUCCINATE PF 100 MG IJ SOLR
200.0000 mg | Freq: Once | INTRAMUSCULAR | Status: AC
  Administered 2016-09-13: 200 mg via INTRAVENOUS
  Filled 2016-09-13: qty 4

## 2016-09-13 NOTE — ED Triage Notes (Signed)
Dialysis pt, last treatment was Monday. Having blisters, pain and swelling to her lower legs x 2 weeks. Denies fever.

## 2016-09-13 NOTE — ED Provider Notes (Signed)
[facility]-EMERGENCY DEPT Provider Note   CSN: [id number] Arrival date & time: 09/13/16  1637     History   Chief Complaint Chief Complaint  Patient presents with  . Leg Pain    HPI [name] Small is a 25 y.o. female.  25 yo F with a chief complaint of right lower extremity pain. This been going on for the past few days. Patient recently had a fistula formed on that side in  the right femoral. She's been having little areas of darkening of the skin was turned into large blisters and then turn into what appeared to be necrotic looking areas. 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 She feels that the pain is significantly worsened over the past few days. Denies fevers or chills denies nausea or vomiting. Patient missed today's dialysis due to pain.   The history is provided by the patient.  Leg Pain   This is a new problem. The current episode started 2 days ago. The problem occurs constantly. The problem has not changed since onset.The pain is present in the left upper leg and right upper leg. The pain is at a severity of 10/10. The pain is severe. The symptoms are aggravated by contact. She has tried nothing for the symptoms. The treatment provided no relief. There has been no history of extremity trauma.    Past Medical History:  Diagnosis Date  . 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 Anemia   . Anxiety   . Cardiomyopathy   . Cellulitis and abscess of face 03/22/2013  . CHF (congestive heart failure) (HCC)   . Chronic anticoagulation   . Chronic pain   . Depression   . DVT (deep venous thrombosis) (HCC) ~ 2014   BLE  . Dysrhythmia    at times per pt.  . 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 ESRD (end stage renal disease) on dialysis [facility])    "MWF; [facility]" (08/15/2016)  .  Fracture, thoracic vertebra (HCC) 07/2016   "T7-T12"  . GERD (gastroesophageal reflux disease)   . H/O transfusion of packed red blood cells   . Heart murmur   . Hemodialysis patient (HCC)    Mon. Wed. Fri  . Hyperkalemia 09/2015  . Hypertension   . Narcotic abuse, continuous   . Osteoporosis   . Pelvic fracture (HCC) 06/2015   "on the right"  . Polycystic kidney disease   . Pulmonary emboli (HCC) 01/2012   Bilateral, moderate clot burden, areas of pulmonary infarction and central necrosis  . Renal insufficiency   . Sickle cell anemia [facility])     Patient Active Problem List   Diagnosis Date Noted  . Cellulitis 09/13/2016  . Generalized anxiety disorder 09/05/2016  . Post-operative pain   . Labile blood pressure   . Closed nondisplaced fracture of pelvis (HCC)   . Other osteoporosis with current pathological fracture   . Recurrent major depressive disorder, in partial remission (HCC)   . Acute blood loss anemia   . 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 Anemia of chronic disease   . H/O medication noncompliance   . 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 Acute on chronic systolic CHF (congestive heart failure) (HCC) 08/16/2016  . Noncompliance of patient with renal dialysis (HCC) 08/12/2016  . Hyperparathyroidism due to end stage renal disease  on dialysis (HCC) 08/01/2016  . Osteoporosis 08/01/2016  . Thoracic compression fracture - T7, T8, T12 07/27/2016  . Acute pulmonary edema (HCC) 07/26/2016  . Essential hypertension 06/17/2016  . Depression with anxiety 06/17/2016  . Volume overload 04/14/2016  . Peripheral edema 04/14/2016  . NICM (nonischemic cardiomyopathy) (HCC) 04/14/2016  . Internal jugular (IJ) vein thromboembolism, acute (HCC) 04/14/2016  . Pericardial effusion 04/14/2016  . Chronic anticoagulation   . Hypertensive urgency 04/11/2016  . DVT (deep venous thrombosis), left 04/11/2016  . Cellulitis of left upper extremity 03/31/2016  . Chronic  pain syndrome 02/29/2016  . Chronic systolic heart failure (HCC) 01/04/2016  . AICD (automatic cardioverter/defibrillator) present 01/04/2016  . 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 Pathologic pelvic fracture 01/04/2016  . Depression 01/04/2016  . Pain in the chest   . ESRD on dialysis (HCC)   . Tachycardia   . Elevated serum hCG   . Chest pain 10/11/2015  . Fluid overload 10/10/2015  . Hypertension 10/10/2015  . Anemia of chronic renal failure, stage 5 (HCC) 10/04/2015  . Thrombocytopenia (HCC) 10/04/2015  . Chest pain of uncertain etiology 10/04/2015  . Chronically Elevated troponin 03/03/2015  . Hyperkalemia 02/21/2015  . Acute on chronic systolic heart failure (HCC) 02/21/2012  . Pulmonary infarct (HCC) 02/07/2012  . ESRD (end stage renal disease) on dialysis (HCC) 02/03/2012  . PE (pulmonary embolism) 02/03/2012    Past Surgical History:  Procedure Laterality Date  . AV FISTULA PLACEMENT Bilateral    "right side stopped working & never had it revised" (04/11/2016)  . AV FISTULA PLACEMENT Right 08/01/2016   Procedure: INSERTION OF ARTERIOVENOUS (AV) GORE-TEX GRAFT RIGHT ARM;  Surgeon: Chuck Hint, MD;  Location: [facility] OR;  Service: Vascular;  Laterality: Right;  . AV FISTULA PLACEMENT Right 08/31/2016   Procedure: INSERTION OF ARTERIOVENOUS (AV) GORE-TEX GRAFT- RIGHT FEMORAL LOOP GRAFT;  Surgeon: Maeola Harman, MD;  Location: [facility] OR;  Service: Vascular;  Laterality: Right;  . AVGG REMOVAL Right 08/03/2016   Procedure: REMOVAL OF ARTERIOVENOUS GORETEX GRAFT (AVGG);  Surgeon: Maeola Harman, MD;  Location: [facility] OR;  Service: Vascular;  Laterality: Right;  . CARDIAC CATHETERIZATION    . IMPLANTABLE CARDIOVERTER DEFIBRILLATOR IMPLANT Right 12/2014  . INCISION AND DRAINAGE ABSCESS Right 03/21/2013   Procedure: INCISION AND DRAINAGE RIGHT CHEEK ABSCESS REMOVAL OF FOREIGN BODY;  Surgeon: Serena Colonel, MD;  Location: [facility] OR;  Service: ENT;  Laterality: Right;    . INSERTION OF DIALYSIS CATHETER Right Mar 09, 2016   thigh, Dr. Wyn Quaker, [facility]  . INSERTION OF DIALYSIS CATHETER Left 09/02/2016   Procedure: Insertion of Dialysis Catheter Left Femoral Vein;  Surgeon: Maeola Harman, MD;  Location[facility] OR;  Service: Vascular;  Laterality: Left;  . 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 NEPHRECTOMY Right     kidney placed in 2008, and body rejected in 2012   . PERIPHERAL VASCULAR CATHETERIZATION Left 03/09/2016   Procedure: A/V Shuntogram/Fistulagram;  Surgeon: Annice Needy, MD;  Location: [facility] INVASIVE CV LAB;  Service: Cardiovascular;  Laterality: Left;  . PERIPHERAL VASCULAR CATHETERIZATION N/A 05/09/2016   Procedure: Dialysis/Perma Catheter Removal;  Surgeon: Renford Dills, MD;  Location: [facility] INVASIVE CV LAB;  Service: Cardiovascular;  Laterality: N/A;  . PERIPHERAL VASCULAR CATHETERIZATION N/A 07/12/2016   Procedure: Dialysis/Perma Catheter Insertion;  Surgeon: Annice Needy, MD;  Location: [facility] INVASIVE CV LAB;  Service: Cardiovascular;  Laterality: N/A;  . PERIPHERAL VASCULAR CATHETERIZATION Right 07/31/2016   Procedure: Upper Extremity  Venography;  Surgeon: Maeola HarmanBrandon Christopher Cain, MD;  [facility];  Service: Cardiovascular;  Laterality: Right;  . REVISON OF ARTERIOVENOUS FISTULA Left 03/22/2016   Procedure: REVISON OF ARTERIOVENOUS FISTULA ( ARTEGRAFT );  Surgeon: Annice NeedyJason S Dew, MD;  Location: [facility] ORS;  Service: Vascular;  Laterality: Left;  . TONSILLECTOMY AND ADENOIDECTOMY  ~ 2000    OB History    No data available       Home Medications    Prior to Admission medications   Medication Sig Start Date End Date Taking? Authorizing Provider  calcitonin, salmon, (MIACALCIN) 200 UNIT/ACT nasal spray Place 1 spray into alternate nostrils daily. 09/01/16  Yes Daniel J Angiulli, PA-C  calcitRIOL (ROCALTROL) 0.5 MCG capsule Take 2 capsules (1 mcg total) by mouth every Monday, Wednesday, and Friday with hemodialysis. 09/01/16  Yes  Daniel J Angiulli, PA-C  cinacalcet (SENSIPAR) 30 MG tablet Take 3 tablets (90 mg total) by mouth daily with supper. 09/01/16  Yes Daniel J Angiulli, PA-C  famotidine (PEPCID) 20 MG tablet Take 1 tablet (20 mg total) by mouth 2 (two) times daily as needed for heartburn or indigestion. 09/06/16  Yes Ankit Karis JubaAnil Patel, MD  gabapentin (NEURONTIN) 100 MG capsule Take 1 capsule (100 mg total) by mouth 2 (two) times daily. 09/05/16  Yes Quentin Angstlugbemiga E Jegede, MD  lanthanum (FOSRENOL) 1000 MG chewable tablet Chew 1 tablet (1,000 mg total) by mouth 3 (three) times daily with meals. 09/01/16  Yes Daniel J Angiulli, PA-C  lidocaine (LIDODERM) 5 % Place 1 patch onto the skin daily. Remove & Discard patch within 12 hours or as directed by MD 09/01/16  Yes Mcarthur Rossettianiel J Angiulli, PA-C  LORazepam (ATIVAN) 1 MG tablet Take 1 tablet (1 mg total) by mouth at bedtime. 09/05/16  Yes Quentin Angstlugbemiga E Jegede, MD  methocarbamol (ROBAXIN) 500 MG tablet Take 1 tablet (500 mg total) by mouth every 6 (six) hours as needed for muscle spasms. 09/01/16  Yes Daniel J Angiulli, PA-C  multivitamin (RENA-VIT) TABS tablet Take 1 tablet by mouth daily. 09/01/16  Yes Daniel J Angiulli, PA-C  naproxen (NAPROSYN) 500 MG tablet Take 1 tablet (500 mg total) by mouth 2 (two) times daily with a meal. 09/01/16  Yes Daniel J Angiulli, PA-C  Oxycodone HCl 10 MG TABS Take 1 tablet (10 mg total) by mouth every 4 (four) hours as needed. 09/05/16  Yes Quentin Angstlugbemiga E Jegede, MD  sertraline (ZOLOFT) 100 MG tablet Take 1 tablet (100 mg total) by mouth daily. 09/01/16  Yes Mcarthur Rossettianiel J Angiulli, PA-C    Family History Family History  Problem Relation Age of Onset  . Polycystic kidney disease Father   . Hypertension Father     Social History Social History  Substance Use Topics  . Smoking status: Former Smoker    Packs/day: 0.00    Years: 1.00    Types: Cigarettes    Quit date: 12/08/2015  . Smokeless tobacco: Never Used  . Alcohol use No     Allergies     Aspirin; Tramadol; Vicodin [hydrocodone-acetaminophen]; Buprenorphine hcl; Iohexol; and Morphine and related   Review of Systems Review of Systems  Constitutional: Negative for chills and fever.  HENT: Negative for congestion and rhinorrhea.   Eyes: Negative for redness and visual disturbance.  Respiratory: Negative for shortness of breath and wheezing.   Cardiovascular: Negative for chest pain and palpitations.  Gastrointestinal: Negative for nausea and vomiting.  Genitourinary: Negative for dysuria and urgency.  Musculoskeletal: Positive for arthralgias and myalgias.  Skin: Positive for color change and wound. Negative for pallor.  Neurological: Negative for dizziness and headaches.     Physical Exam Updated Vital Signs BP 107/69   Pulse 110   Temp 98.6 F (37 C) (Oral)   Resp 15   Ht 5\' 3"  (1.6 m)   Wt 113 lb (51.3 kg)   SpO2 100%   BMI 20.02 kg/m   Physical Exam  Constitutional: She is oriented to person, place, and time. She appears well-developed and well-nourished. No distress.  HENT:  Head: Normocephalic and atraumatic.  Eyes: EOM are normal. Pupils are equal, round, and reactive to light.  Neck: Normal range of motion. Neck supple.  Cardiovascular: Normal rate and regular rhythm.  Exam reveals no gallop and no friction rub.   No murmur heard. Pulmonary/Chest: Effort normal. She has no wheezes. She has no rales.  Abdominal: Soft. She exhibits no distension and no mass. There is no tenderness. There is no guarding.  Musculoskeletal: She exhibits edema and tenderness.  Right upper leg with multiple blisters that are radiating and having a foul-smelling yellowish discharge. Significant induration in the area as well. Right lower extremity is much more swollen than the left. 2+ pitting edema.  Neurological: She is alert and oriented to person, place, and time.  Skin: Skin is warm and dry. She is not diaphoretic.  Psychiatric: She has a normal mood and affect. Her  behavior is normal.  Nursing note and vitals reviewed.    ED Treatments / Results  Labs (all labs ordered are listed, but only abnormal results are displayed) Labs Reviewed  CBC WITH DIFFERENTIAL/PLATELET - Abnormal; Notable for the following:       Result Value   WBC 13.9 (*)    RBC 3.43 (*)    Hemoglobin 9.6 (*)    HCT 30.9 (*)    RDW 22.9 (*)    Neutro Abs 11.2 (*)    All other components within normal limits  BASIC METABOLIC PANEL - Abnormal; Notable for the following:    Chloride 95 (*)    BUN 40 (*)    Creatinine, Ser 5.80 (*)    GFR calc non Af Amer 9 (*)    GFR calc Af Amer 11 (*)    Anion gap 19 (*)    All other components within normal limits  HCG, QUANTITATIVE, PREGNANCY - Abnormal; Notable for the following:    hCG, Beta Chain, Quant, S 17 (*)    All other components within normal limits  I-STAT CHEM 8, ED - Abnormal; Notable for the following:    Chloride 98 (*)    BUN 39 (*)    Creatinine, Ser 5.90 (*)    Calcium, Ion 1.02 (*)    All other components within normal limits  I-STAT CG4 LACTIC ACID, ED - Abnormal; Notable for the following:    Lactic Acid, Venous 2.38 (*)    All other components within normal limits    EKG  EKG Interpretation  Date/Time:  Wednesday September 13 2016 19:24:28 EST Ventricular Rate:  96 PR Interval:    QRS Duration: 102 QT Interval:  377 QTC Calculation: 477 R Axis:   97 Text Interpretation:  Sinus rhythm Prolonged PR interval Probable left atrial enlargement Borderline right axis deviation Abnormal T, consider ischemia, diffuse leads No significant change since last tracing Confirmed by Berlin Viereck MD, Reuel Boom ([id number]) on 09/13/2016 7:39:06 PM       Radiology No results found.  Procedures Procedures (including critical care time)  Medications Ordered in ED Medications  vancomycin (VANCOCIN) IVPB 1000 mg/200 mL premix (1,000 mg Intravenous New Bag/Given 09/13/16 2252)  piperacillin-tazobactam (ZOSYN) IVPB 3.375 g (3.375 g  Intravenous New Bag/Given 09/13/16 2254)  HYDROmorphone (DILAUDID) injection 0.5 mg (0.5 mg Intravenous Given 09/13/16 2017)  HYDROmorphone (DILAUDID) injection 1 mg (1 mg Intravenous Given 09/13/16 2133)  ondansetron (ZOFRAN) injection 4 mg (4 mg Intravenous Given 09/13/16 2133)  hydrocortisone sodium succinate (SOLU-CORTEF) 100 MG injection 200 mg (200 mg Intravenous Given 09/13/16 2133)  diphenhydrAMINE (BENADRYL) injection 50 mg (50 mg Intravenous Given 09/13/16 2133)     Initial Impression / Assessment and Plan / ED Course  I have reviewed the triage vital signs and the nursing notes.  Pertinent labs & imaging results that were available during my care of the patient were reviewed by me and considered in my medical decision making (see chart for details).  Clinical Course     25 yo F With a chief complaint of right leg pain. Patient has significant induration and erythema to the right groin. This is an area that patient recently had a fistula formed. I'm concerned that there may be a deep space infection or necrotizing infection. Will obtain a CT scan to further evaluate. Check labs to evaluate if patient needs urgent dialysis.   Labs without hyperkalemia or acidosis.  Not clinically fluid overloaded, not need o2.  Vascular, Dr. Randie Heinz evaluated patient at bedside.   Feels that fistula likely needs to be removed, but not emergently so.  CT scan with diffuse skin changes, started on vanc, zosyn.   The patients results and plan were reviewed and discussed.   Any x-rays performed were independently reviewed by myself.   Differential diagnosis were considered with the presenting HPI.  Medications  vancomycin (VANCOCIN) IVPB 1000 mg/200 mL premix (1,000 mg Intravenous New Bag/Given 09/13/16 2252)  piperacillin-tazobactam (ZOSYN) IVPB 3.375 g (3.375 g Intravenous New Bag/Given 09/13/16 2254)  HYDROmorphone (DILAUDID) injection 0.5 mg (0.5 mg Intravenous Given 09/13/16 2017)  HYDROmorphone  (DILAUDID) injection 1 mg (1 mg Intravenous Given 09/13/16 2133)  ondansetron (ZOFRAN) injection 4 mg (4 mg Intravenous Given 09/13/16 2133)  hydrocortisone sodium succinate (SOLU-CORTEF) 100 MG injection 200 mg (200 mg Intravenous Given 09/13/16 2133)  diphenhydrAMINE (BENADRYL) injection 50 mg (50 mg Intravenous Given 09/13/16 2133)    Vitals:   09/13/16 2215 09/13/16 2230 09/13/16 2236 09/13/16 2245  BP: 115/75 109/67 119/82 107/69  Pulse:  109 109 110  Resp:  18 22 15   Temp:      TempSrc:      SpO2:  99% 100% 100%  Weight:      Height:        Final diagnoses:  Right leg swelling  Cellulitis of right lower extremity    Admission/ observation were discussed with the admitting physician, patient and/or family and they are comfortable with the plan.     Final Clinical Impressions(s) / ED Diagnoses   Final diagnoses:  Right leg swelling  Cellulitis of right lower extremity    New Prescriptions New Prescriptions   No medications on file     [name], DO 09/13/16 2302

## 2016-09-13 NOTE — Consult Note (Signed)
Hospital Consult    Reason for Consult:  Right leg swelling MRN #:  [id number]  History of Present Illness: This is a 25 y.o. female with end-stage renal disease who is status post recent hospitalization with thoracic spine fracture from fall. She has congestive heart failure with AICD placement. Wheezing underwent right upper extremity AV graft placement which had to be excised or steal in the postoperative period. She subsequently had a right femoral loop AV graft placed. At the time of operation she was noted to have trabeculae within the femoral vein consistent with chronic DVT. The graft was opened into her SFA she did tolerate this well several days later she had a tunneled dialysis catheter replacement in her left femoral vein. She now returns with a few day history of right lower extremity pain that is diffuse. She has blisters both overlying the graft and on the lateral aspect of the right leg. One blister has burst on the posterior aspect. She has associated eschar overlying the graft. She denies any fevers but does have pain throughout the leg and swelling that has been going on since earlier today. Due to pain she was unable to dialyze today. She also has an eschar over the left calf that has been there since hospitalization and is not changed. She is dialyzing via her left femoral TDC without issue at this time does not have any erythema or issues with that.  Past Medical History:  Diagnosis Date  . 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 Anemia   . Anxiety   . Cardiomyopathy   . Cellulitis and abscess of face 03/22/2013  . CHF (congestive heart failure) (HCC)   . Chronic anticoagulation   . Chronic pain   . Depression   . DVT (deep venous thrombosis) (HCC) ~ 2014   BLE  . Dysrhythmia    at times per pt.  . 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 ESRD (end stage renal disease) on dialysis [facility])    "MWF; [facility]" (08/15/2016)  . Fracture, thoracic vertebra (HCC) 07/2016   "T7-T12"  . GERD (gastroesophageal reflux disease)   . H/O transfusion of packed red blood cells   . Heart murmur   . Hemodialysis patient (HCC)    Mon. Wed. Fri  . Hyperkalemia 09/2015  . Hypertension   . Narcotic abuse, continuous   . Osteoporosis   . Pelvic fracture (HCC) 06/2015   "on the right"  . Polycystic kidney disease   . Pulmonary emboli (HCC) 01/2012   Bilateral, moderate clot burden, areas of pulmonary infarction and central necrosis  . Renal insufficiency   . Sickle cell anemia (HCC)     Past Surgical History:  Procedure Laterality Date  . AV FISTULA PLACEMENT Bilateral    "right side stopped working & never had it revised" (04/11/2016)  . AV FISTULA PLACEMENT Right 08/01/2016   Procedure: INSERTION OF ARTERIOVENOUS (AV) GORE-TEX GRAFT RIGHT ARM;  Surgeon: Chuck Hint, MD;  Location: [facility] OR;  Service: Vascular;  Laterality: Right;  . AV FISTULA PLACEMENT Right 08/31/2016   Procedure: INSERTION OF ARTERIOVENOUS (AV) GORE-TEX GRAFT- RIGHT FEMORAL LOOP GRAFT;  Surgeon: Maeola Harman, MD;  Location: [facility] OR;  Service: Vascular;  Laterality: Right;  . AVGG REMOVAL Right 08/03/2016   Procedure: REMOVAL OF ARTERIOVENOUS GORETEX GRAFT (AVGG);  Surgeon: Maeola Harman, MD;  Location: [facility]  OR;  Service: Vascular;  Laterality: Right;  . CARDIAC CATHETERIZATION    . IMPLANTABLE CARDIOVERTER DEFIBRILLATOR IMPLANT Right 12/2014  . INCISION AND DRAINAGE ABSCESS Right 03/21/2013   Procedure: INCISION AND DRAINAGE RIGHT CHEEK ABSCESS REMOVAL OF FOREIGN BODY;  Surgeon: Serena ColonelJefry Rosen, MD;  [facility] OR;  Service: ENT;  Laterality: Right;  . INSERTION OF DIALYSIS CATHETER Right Mar 09, 2016   th[facility]  . INSERTION OF DIALYSIS CATHETER Left 09/02/2016   Procedure: Insertion of Dialysis  Catheter Left Femoral Vein;  Surgeon: Maeola HarmanBrandon Christopher Cain, MD;  [facility];  Service: Vascular;  Laterality: Left;  . 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 NEPHRECTOMY Right     kidney placed in 2008, and body rejected in 2012   . PERIPHERAL VASCULAR CATHETERIZATION Left 03/09/2016   Procedure: A/V Shuntogram/Fistulagram;  Surgeon: Annice NeedyJason S Dew, MD;  Location: [facility] INVASIVE CV LAB;  Service: Cardiovascular;  Laterality: Left;  . PERIPHERAL VASCULAR CATHETERIZATION N/A 05/09/2016   Procedure: Dialysis/Perma Catheter Removal;  Surgeon: Renford DillsGregory G Schnier, MD;  Location: [facility] INVASIVE CV LAB;  Service: Cardiovascular;  Laterality: N/A;  . PERIPHERAL VASCULAR CATHETERIZATION N/A 07/12/2016   Procedure: Dialysis/Perma Catheter Insertion;  Surgeon: Annice NeedyJason S Dew, MD;  Location: [facility] INVASIVE CV LAB;  Service: Cardiovascular;  Laterality: N/A;  . PERIPHERAL VASCULAR CATHETERIZATION Right 07/31/2016   Procedure: Upper Extremity Venography;  Surgeon: Maeola HarmanBrandon Christopher Cain, MD;  [facility];  Service: Cardiovascular;  Laterality: Right;  . REVISON OF ARTERIOVENOUS FISTULA Left 03/22/2016   Procedure: REVISON OF ARTERIOVENOUS FISTULA ( ARTEGRAFT );  Surgeon: Annice NeedyJason S Dew, MD;  Location: [facility] ORS;  Service: Vascular;  Laterality: Left;  . TONSILLECTOMY AND ADENOIDECTOMY  ~ 2000    Allergies  Allergen Reactions  . Aspirin Other (See Comments)    Interacts with Coreg  . Tramadol Anaphylaxis  . Vicodin [Hydrocodone-Acetaminophen] Hives  . Buprenorphine Hcl Itching    Ok with oxycodone  . Iohexol Itching  . Morphine And Related Itching and Other (See Comments)    Ok with oxycodone     Prior to Admission medications   Medication Sig Start Date End Date Taking? Authorizing Provider  calcitonin, salmon, (MIACALCIN) 200 UNIT/ACT nasal spray Place 1 spray into alternate nostrils daily. 09/01/16  Yes Daniel J Angiulli, PA-C  calcitRIOL (ROCALTROL) 0.5 MCG capsule Take 2 capsules (1 mcg  total) by mouth every Monday, Wednesday, and Friday with hemodialysis. 09/01/16  Yes Daniel J Angiulli, PA-C  cinacalcet (SENSIPAR) 30 MG tablet Take 3 tablets (90 mg total) by mouth daily with supper. 09/01/16  Yes Daniel J Angiulli, PA-C  famotidine (PEPCID) 20 MG tablet Take 1 tablet (20 mg total) by mouth 2 (two) times daily as needed for heartburn or indigestion. 09/06/16  Yes Ankit Karis JubaAnil Patel, MD  gabapentin (NEURONTIN) 100 MG capsule Take 1 capsule (100 mg total) by mouth 2 (two) times daily. 09/05/16  Yes Quentin Angstlugbemiga E Jegede, MD  lanthanum (FOSRENOL) 1000 MG chewable tablet Chew 1 tablet (1,000 mg total) by mouth 3 (three) times daily with meals. 09/01/16  Yes Daniel J Angiulli, PA-C  lidocaine (LIDODERM) 5 % Place 1 patch onto the skin daily. Remove & Discard patch within 12 hours or as directed by MD 09/01/16  Yes Mcarthur Rossettianiel J Angiulli, PA-C  LORazepam (ATIVAN) 1 MG tablet Take 1 tablet (1 mg total) by mouth at bedtime. 09/05/16  Yes Quentin Angstlugbemiga E Jegede, MD  methocarbamol (ROBAXIN) 500 MG tablet Take 1 tablet (  500 mg total) by mouth every 6 (six) hours as needed for muscle spasms. 09/01/16  Yes Daniel J Angiulli, PA-C  multivitamin (RENA-VIT) TABS tablet Take 1 tablet by mouth daily. 09/01/16  Yes Daniel J Angiulli, PA-C  naproxen (NAPROSYN) 500 MG tablet Take 1 tablet (500 mg total) by mouth 2 (two) times daily with a meal. 09/01/16  Yes Daniel J Angiulli, PA-C  Oxycodone HCl 10 MG TABS Take 1 tablet (10 mg total) by mouth every 4 (four) hours as needed. 09/05/16  Yes Quentin Angst, MD  sertraline (ZOLOFT) 100 MG tablet Take 1 tablet (100 mg total) by mouth daily. 09/01/16  Yes Charlton Amor, PA-C    Social History   Social History  . Marital status: Single    Spouse name: N/A  . Number of children: N/A  . Years of education: N/A   Occupational History  . Not on file.   Social History Main Topics  . Smoking status: Former Smoker    Packs/day: 0.00    Years: 1.00     Types: Cigarettes    Quit date: 12/08/2015  . Smokeless tobacco: Never Used  . Alcohol use No  . Drug use: No  . Sexual activity: Not Currently    Birth control/ protection: None   Other Topics Concern  . Not on file   Social History Narrative   Was smoking 3 cigs per day. Lives at home with roommates, has some family locally.      Family History  Problem Relation Age of Onset  . Polycystic kidney disease Father   . Hypertension Father     ROS:  Right leg pain as above, non-healing ulcer left leg   Physical Examination  Vitals:   09/13/16 2130 09/13/16 2236  BP: 113/73 119/82  Pulse:  109  Resp: 23 22  Temp:     Body mass index is 20.02 kg/m.  General:  WDWN in NAD HENT: WNL, normocephalic Pulmonary: normal non-labored breathing, without Rales, rhonchi,  wheezing Cardiac: thrill in right thigh graft, monophasic signals at bilateral dp Abdomen: soft, NT/ND, no masses Skin: eschar left calf, darkened skin overlying lateral aspect of right thigh graft, blistering of righ leg Extremities: non pitting edema right leg Neurologic: sensation and motor in tact of both feet   CBC    Component Value Date/Time   WBC 13.9 (H) 09/13/2016 1805   RBC 3.43 (L) 09/13/2016 1805   HGB 12.2 09/13/2016 1945   HCT 36.0 09/13/2016 1945   PLT 355 09/13/2016 1805   MCV 90.1 09/13/2016 1805   MCH 28.0 09/13/2016 1805   MCHC 31.1 09/13/2016 1805   RDW 22.9 (H) 09/13/2016 1805   LYMPHSABS 1.8 09/13/2016 1805   MONOABS 0.8 09/13/2016 1805   EOSABS 0.1 09/13/2016 1805   BASOSABS 0.0 09/13/2016 1805    BMET    Component Value Date/Time   NA 139 09/13/2016 1945   K 3.9 09/13/2016 1945   CL 98 (L) 09/13/2016 1945   CO2 26 09/13/2016 1805   GLUCOSE 85 09/13/2016 1945   BUN 39 (H) 09/13/2016 1945   CREATININE 5.90 (H) 09/13/2016 1945   CALCIUM 9.2 09/13/2016 1805   GFRNONAA 9 (L) 09/13/2016 1805   GFRAA 11 (L) 09/13/2016 1805    COAGS: Lab Results  Component Value Date     INR 1.35 06/17/2016   INR 1.55 (H) 04/12/2016   INR 1.35 03/31/2016     Non-Invasive Vascular Imaging:   CT scan reviewed with diffuse  anasarca and edema of rle  ASSESSMENT/PLAN: This is a 25 y.o. female with recent history of right femoral AV loop graft placement currently dialyzing through a left femoral tunneled dialysis catheter. She now presents with diffuse edema of her right lower extremity with ulceration of the right leg and discolored skin overlying the graft. This graft has not been used for dialysis yet. In reviewing the CT there are no frank fluid collections but given that it was connected to a vein with by comparing DVT this could be edema from an outflow stenosis. She could also have steal causing the blisters on her legs although she has similar monophasic signals bilaterally. Either way think the graft was to come out as a skin is likely to necrosis over top of it and expose the graft. This not does not have to be done on an emergent basis and I have discussed this with family and they are in agreement. Patient will need dialysis via her left femoral tunneled dialysis catheter. Please keep nothing by mouth after midnight for evaluation tomorrow although I have discussed with him the graft is more likely to be excised on Friday.     Brandon C. Randie Heinz, MD [facility] Office: [phone] Pager: [phone]

## 2016-09-13 NOTE — ED Triage Notes (Signed)
The pt name was pulled to a room on pod e  Apparently some of our newer staff do not know that they have to come getpts  Instead they moved this pt off the floor without ever calling them  pts family came up asking how much longer.  The pt wasmoved at 1755 to that room  Still waiting for a room now

## 2016-09-13 NOTE — ED Notes (Signed)
Upon assessment on my patient in E41, (which is the room where this patient [name] Small supposed to be admitted), , the wrong patient was brought to the room by EMS. Charge nurse was called and aware by Kirt Boys, RN and pt was moved off the floor, so we can place pt in new room, that was occupied at that moment. As soon as pt in E40 was discharged, 1st nurse was aware to place the correct patient into the room.

## 2016-09-14 ENCOUNTER — Encounter (HOSPITAL_COMMUNITY): Payer: Self-pay | Admitting: General Practice

## 2016-09-14 DIAGNOSIS — R600 Localized edema: Secondary | ICD-10-CM | POA: Diagnosis present

## 2016-09-14 LAB — CBC
HEMATOCRIT: 31.2 % — AB (ref 36.0–46.0)
HEMOGLOBIN: 9.5 g/dL — AB (ref 12.0–15.0)
MCH: 27.4 pg (ref 26.0–34.0)
MCHC: 30.4 g/dL (ref 30.0–36.0)
MCV: 89.9 fL (ref 78.0–100.0)
Platelets: 362 10*3/uL (ref 150–400)
RBC: 3.47 MIL/uL — AB (ref 3.87–5.11)
RDW: 23.1 % — ABNORMAL HIGH (ref 11.5–15.5)
WBC: 16.6 10*3/uL — AB (ref 4.0–10.5)

## 2016-09-14 LAB — GLUCOSE, CAPILLARY
GLUCOSE-CAPILLARY: 203 mg/dL — AB (ref 65–99)
Glucose-Capillary: 136 mg/dL — ABNORMAL HIGH (ref 65–99)
Glucose-Capillary: 128 mg/dL — ABNORMAL HIGH (ref 65–99)
Glucose-Capillary: 135 mg/dL — ABNORMAL HIGH (ref 65–99)
Glucose-Capillary: 153 mg/dL — ABNORMAL HIGH (ref 65–99)

## 2016-09-14 LAB — BASIC METABOLIC PANEL
Anion gap: 16 — ABNORMAL HIGH (ref 5–15)
BUN: 48 mg/dL — ABNORMAL HIGH (ref 6–20)
CHLORIDE: 97 mmol/L — AB (ref 101–111)
CO2: 27 mmol/L (ref 22–32)
Calcium: 9.4 mg/dL (ref 8.9–10.3)
Creatinine, Ser: 6.55 mg/dL — ABNORMAL HIGH (ref 0.44–1.00)
GFR calc non Af Amer: 8 mL/min — ABNORMAL LOW (ref >60)
GFR, EST AFRICAN AMERICAN: 9 mL/min — AB (ref >60)
Glucose, Bld: 123 mg/dL — ABNORMAL HIGH (ref 65–99)
POTASSIUM: 5.1 mmol/L (ref 3.5–5.1)
SODIUM: 140 mmol/L (ref 135–145)

## 2016-09-14 LAB — I-STAT CG4 LACTIC ACID, ED: LACTIC ACID, VENOUS: 1.81 mmol/L (ref 0.5–1.9)

## 2016-09-14 LAB — MAGNESIUM: Magnesium: 2.2 mg/dL (ref 1.7–2.4)

## 2016-09-14 MED ORDER — METHOCARBAMOL 500 MG PO TABS
500.0000 mg | ORAL_TABLET | Freq: Four times a day (QID) | ORAL | Status: DC | PRN
  Administered 2016-09-14 – 2016-09-16 (×4): 500 mg via ORAL
  Filled 2016-09-14 (×4): qty 1

## 2016-09-14 MED ORDER — HYDROMORPHONE HCL 1 MG/ML IJ SOLN
0.5000 mg | INTRAMUSCULAR | Status: DC | PRN
  Administered 2016-09-14 – 2016-09-16 (×11): 0.5 mg via INTRAVENOUS
  Filled 2016-09-14 (×11): qty 1

## 2016-09-14 MED ORDER — DEXTROSE 5 % IV SOLN: INTRAVENOUS | Status: DC

## 2016-09-14 MED ORDER — HYDROCORTISONE NA SUCCINATE PF 100 MG IJ SOLR
100.0000 mg | Freq: Three times a day (TID) | INTRAMUSCULAR | Status: DC
  Administered 2016-09-14 – 2016-09-15 (×2): 100 mg via INTRAVENOUS
  Filled 2016-09-14 (×2): qty 2

## 2016-09-14 MED ORDER — DOXERCALCIFEROL 4 MCG/2ML IV SOLN: 1.0000 ug | INTRAVENOUS | Status: DC

## 2016-09-14 MED ORDER — LANTHANUM CARBONATE 500 MG PO CHEW
1000.0000 mg | CHEWABLE_TABLET | Freq: Three times a day (TID) | ORAL | Status: DC
  Administered 2016-09-14 – 2016-09-20 (×16): 1000 mg via ORAL
  Filled 2016-09-14 (×16): qty 2

## 2016-09-14 MED ORDER — RENA-VITE PO TABS
1.0000 | ORAL_TABLET | Freq: Every day | ORAL | Status: DC
  Administered 2016-09-14 – 2016-09-26 (×13): 1 via ORAL
  Filled 2016-09-14 (×13): qty 1

## 2016-09-14 MED ORDER — CALCITRIOL 0.5 MCG PO CAPS: 1.0000 ug | ORAL_CAPSULE | ORAL | Status: DC

## 2016-09-14 MED ORDER — VANCOMYCIN HCL 500 MG IV SOLR
500.0000 mg | INTRAVENOUS | Status: DC
  Administered 2016-09-22: 500 mg via INTRAVENOUS
  Filled 2016-09-14 (×5): qty 500

## 2016-09-14 MED ORDER — FAMOTIDINE 20 MG PO TABS
20.0000 mg | ORAL_TABLET | Freq: Two times a day (BID) | ORAL | Status: DC | PRN
  Administered 2016-09-14: 20 mg via ORAL
  Filled 2016-09-14: qty 1

## 2016-09-14 MED ORDER — DARBEPOETIN ALFA 60 MCG/0.3ML IJ SOSY
60.0000 ug | PREFILLED_SYRINGE | INTRAMUSCULAR | Status: DC
  Administered 2016-09-15: 60 ug via INTRAVENOUS
  Filled 2016-09-14 (×2): qty 0.3

## 2016-09-14 MED ORDER — HEPARIN SODIUM (PORCINE) 5000 UNIT/ML IJ SOLN
5000.0000 [IU] | Freq: Three times a day (TID) | INTRAMUSCULAR | Status: DC
  Filled 2016-09-14 (×4): qty 1

## 2016-09-14 MED ORDER — CINACALCET HCL 30 MG PO TABS
90.0000 mg | ORAL_TABLET | Freq: Every day | ORAL | Status: DC
  Administered 2016-09-14 – 2016-09-27 (×13): 90 mg via ORAL
  Filled 2016-09-14 (×14): qty 3

## 2016-09-14 MED ORDER — CALCITONIN (SALMON) 200 UNIT/ACT NA SOLN
1.0000 | Freq: Every day | NASAL | Status: DC
  Administered 2016-09-14 – 2016-09-27 (×14): 1 via NASAL
  Filled 2016-09-14: qty 3.7

## 2016-09-14 MED ORDER — GABAPENTIN 100 MG PO CAPS
100.0000 mg | ORAL_CAPSULE | Freq: Two times a day (BID) | ORAL | Status: DC
  Administered 2016-09-14 – 2016-09-22 (×16): 100 mg via ORAL
  Filled 2016-09-14 (×17): qty 1

## 2016-09-14 MED ORDER — PIPERACILLIN-TAZOBACTAM 3.375 G IVPB
3.3750 g | Freq: Two times a day (BID) | INTRAVENOUS | Status: DC
  Administered 2016-09-14 – 2016-09-19 (×11): 3.375 g via INTRAVENOUS
  Filled 2016-09-14 (×12): qty 50

## 2016-09-14 MED ORDER — LORAZEPAM 1 MG PO TABS
1.0000 mg | ORAL_TABLET | Freq: Every day | ORAL | Status: DC
Start: 2016-09-14 — End: 2016-09-27
  Administered 2016-09-14 – 2016-09-26 (×13): 1 mg via ORAL
  Filled 2016-09-14 (×14): qty 1

## 2016-09-14 MED ORDER — DEXTROSE-NACL 5-0.45 % IV SOLN
INTRAVENOUS | Status: DC
  Administered 2016-09-14: 18:00:00 via INTRAVENOUS

## 2016-09-14 MED ORDER — ONDANSETRON HCL 4 MG PO TABS: 4.0000 mg | ORAL_TABLET | Freq: Four times a day (QID) | ORAL | Status: DC | PRN

## 2016-09-14 MED ORDER — ONDANSETRON HCL 4 MG/2ML IJ SOLN: 4.0000 mg | Freq: Four times a day (QID) | INTRAMUSCULAR | Status: DC | PRN

## 2016-09-14 MED ORDER — SERTRALINE HCL 100 MG PO TABS
100.0000 mg | ORAL_TABLET | Freq: Every day | ORAL | Status: DC
  Administered 2016-09-14 – 2016-09-27 (×13): 100 mg via ORAL
  Filled 2016-09-14 (×13): qty 1

## 2016-09-14 NOTE — Progress Notes (Addendum)
PROGRESS NOTE    [name] SorrowVictoria Greer  QMV:[id number] DOB: 05/10/1991 DOA: 09/13/2016 PCP: Jeanann LewandowskyJEGEDE, OLUGBEMIGA, MD     Brief Narrative:  [name] Small is a 25 y.o. 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 Recently hospitalized from 10/22-11/10/17 with T7,T8,T12 vertebral compression fractures and volume overload related to noncompliance with HD. She had a new right femoral AVG placed during admission on 08/31/16. Graft has not been used for HD.  Following her most recent discharge [facility]. Her last HD was on 11/20 for 3 hours 27 mins. She has been attending HD since discharge though she did miss HD yesterday  Presents to the ER because of increasing swelling of the right lower extremity with ulcerations and blisters. CT scan was done which was showing diffuse edema. Patient has had recent AV graft placed on right lower extremity and gets her dialysis done on the left lower extremity dialysis catheter. On exam patient has eschar over the recent AV graft placement on the right lower extremity with blisters in the lower extremity both left and right. There is diffuse edema of the right lower extremity.   Subjective: 11/23 A/O 4, negative CP, negative SOB. Positive lower extremity pain.   Assessment & Plan:   Principal Problem:   Edema of right lower extremity Active Problems:   ESRD (end stage renal disease) on dialysis (HCC)   Hypertension   AICD (automatic cardioverter/defibrillator) present   NICM (nonischemic cardiomyopathy) (HCC)   Cellulitis   Leg edema, right   Right lower extremity edema with blisters and ulceration/infected AV graft Right thigh  -.Poorly healing  right thigh av graft with skin necrosis Removal right thigh graft by Dr Randie Heinzain in OR tomorrow 11/24 -Blood culture pending -Continue current antibiotics -D5-0.45% saline at 830ml/hr  Hypotension - Stress dose steroids Solu-Cortef 100 mg TID  ESRD on HD M/W/F -patient  Missed HD yesterday.  Per Dr Briant CedarMattingly nephrology HD to be performed early on 11/24 prior to graft removal surgery  Nonischemic cardiomyopathy S/P AICD placement -EF 06/2016 =12% -Strict I&O -Daily weight  T7,T8,T12 vertebral compression fractures -Use pain medication judiciously secondary to hypotension  Chronic anemia -Most likely from ESRD Recent Labs Lab 09/14/16 0539  HGB 9.5*    Goals of care  -PALLIATIVE CARE consult placed highly encourage patient to change CODE STATUS to DO NOT RESUSCITATE, discuss short-term vs long-term goals care. Consider hospice. Multisystem organ failure     DVT prophylaxis: Subcutaneous heparin Code Status: Full Family Communication: None Disposition Plan: Per surgery and nephrology   Consultants:  Dr. Primitivo GauzeMichael Mattingly Nephrology Dr.Brandon Addison Baileyhristopher Cain Vascular surgery    Procedures/Significant Events:  11/22 CT bilateral lower extremity:-No evidence of fracture or dislocation. -. Diffuse soft tissue edema involving right hip/entirety of right lower extremity, with large 5.4 cm fluid-filled blister along the anterolateral right lower leg. Multiple smaller blisters along the medial right thigh. Negative abscess - Small bilateral knee joint effusions, Rt>> Lt    VENTILATOR SETTINGS:    Cultures 11/23 blood 2 pending   Antimicrobials: Zosyn 11/22>> Vancomycin 11/22>>   Devices None   LINES / TUBES:  Left HD cath>>    Continuous Infusions:   Objective: Vitals:   09/14/16 0430 09/14/16 0602 09/14/16 0700 09/14/16 0953  BP: 103/64 Marland Kitchen(!)  90/50 109/83 (!) 88/59  Pulse: (!) 107  (!) 105 100  Resp: 18     Temp: 99.6 F (37.6 C)   98.6 F (37 C)    TempSrc: Oral   Oral  SpO2: 93%   95%  Weight:      Height:        Intake/Output Summary (Last 24 hours) at 09/14/16 1408 Last data filed at 09/14/16 1000  Gross per 24 hour  Intake              250 ml  Output                0 ml  Net              250 ml   Filed Weights   09/13/16 2046 09/14/16 0053  Weight: 51.3 kg (113 lb) 50.2 kg (110 lb 9.6 oz)    Examination:  General: A/O 4, cachectic, No acute respiratory distress Eyes: negative scleral hemorrhage, negative anisocoria, negative icterus ENT: Negative Runny nose, negative gingival bleeding, Neck:  Negative scars, masses, torticollis, lymphadenopathy, JVD Lungs: Clear to auscultation bilaterally without wheezes or crackles Cardiovascular: Tachycardic, Regular rhythm without murmur gallop or rub normal S1 and S2 Abdomen: negative abdominal pain, nondistended, positive soft, bowel sounds, no rebound, no ascites, no appreciable mass Extremities: hypertrophy lower extremity, with blisters on left lower extremity  Psychiatric:  Positive depression, negative anxiety, negative fatigue, negative mania  Central nervous system:  Cranial nerves II through XII intact, tongue/uvula midline, all extremities muscle strength 5/5, sensation intact throughout, negative dysarthria, negative expressive aphasia, negative receptive aphasia.  .     Data Reviewed: Care during the described time interval was provided by me .  I have reviewed this patient's available data, including medical history, events of note, physical examination, and all test results as part of my evaluation. I have personally reviewed and interpreted all radiology studies.  CBC:  Recent Labs Lab 09/13/16 1805 09/13/16 1945 09/14/16 0539  WBC 13.9*  --  16.6*  NEUTROABS 11.2*  --   --   HGB 9.6* 12.2 9.5*  HCT 30.9* 36.0 31.2*  MCV 90.1  --  89.9  PLT 355  --  362   Basic Metabolic Panel:  Recent Labs Lab 09/13/16 1805 09/13/16 1945 09/14/16 0539  NA 140  139 140  K 3.9 3.9 5.1  CL 95* 98* 97*  CO2 26  --  27  GLUCOSE 93 85 123*  BUN 40* 39* 48*  CREATININE 5.80* 5.90* 6.55*  CALCIUM 9.2  --  9.4   GFR: Estimated Creatinine Clearance: 10.4 mL/min (by C-G formula based on SCr of 6.55 mg/dL (H)). Liver Function Tests: No results for input(s): AST, ALT, ALKPHOS, BILITOT, PROT, ALBUMIN in the last 168 hours. No results for input(s): LIPASE, AMYLASE in the last 168 hours. No results for input(s): AMMONIA in the last 168 hours. Coagulation Profile: No results for input(s): INR, PROTIME in the last 168 hours. Cardiac Enzymes: No results for input(s): CKTOTAL, CKMB, CKMBINDEX, TROPONINI in the last 168 hours. BNP (last 3 results) No results for input(s): PROBNP in the last 8760 hours. HbA1C: No results for input(s): HGBA1C in the last 72 hours. CBG:  Recent Labs Lab 09/14/16 0418 09/14/16 0747 09/14/16 1124  GLUCAP 136* 128* 203*   Lipid Profile: No results for input(s): CHOL, HDL, LDLCALC, TRIG, CHOLHDL, LDLDIRECT in the last 72 hours. Thyroid Function Tests: No results for input(s): TSH, T4TOTAL, FREET4, T3FREE, THYROIDAB in  the last 72 hours. Anemia Panel: No results for input(s): VITAMINB12, FOLATE, FERRITIN, TIBC, IRON, RETICCTPCT in the last 72 hours. Sepsis Labs:  Recent Labs Lab 09/13/16 2130 09/14/16 0018  LATICACIDVEN 2.38* 1.81    No results found for this or any previous visit (from the past 240 hour(s)).       Radiology Studies: Ct Extrem Lower Wo Cm Bil  Result Date: 09/13/2016 CLINICAL DATA:  Acute onset of right leg swelling and large blister along the right lower leg. Right foot pain. Initial encounter. EXAM: CT OF THE LOWER BILATERAL EXTREMITY WITHOUT CONTRAST TECHNIQUE: Multidetector CT imaging of the lower bilateral extremity was performed according to the standard protocol. COMPARISON:  Left lower extremity venous Doppler ultrasound performed 01/25/2015 FINDINGS: Bones/Joint/Cartilage There is no  evidence of fracture or dislocation. The visualized osseous structures appear intact. There is marked chronic deformity of the right pubic rami and right side of the pubic symphysis, with heterotopic bone formation. No definite focal osseous erosions are seen. The cartilage is not well assessed on CT. Ligaments Suboptimally assessed by CT. Muscles and Tendons The visualized musculature is grossly unremarkable. The tendon structures are unremarkable in appearance. No intramuscular hematoma is seen. Soft tissues There is diffuse soft tissue edema involving the right hip and entirety of the right lower extremity, with a large 5.4 cm subcutaneous fluid-filled blister along the anterolateral right lower leg. Multiple smaller blisters are seen along the medial right thigh. The vasculature is not well assessed without contrast. Diffuse arterial calcification is noted throughout both lower extremities, with a vascular graft noted at the anterior right thigh. Small bilateral knee joint effusions are noted, slightly larger on the right. No abnormal focal fluid collections are seen to suggest abscess. IMPRESSION: 1. No evidence of fracture or dislocation. 2. Diffuse soft tissue edema involving the right hip and entirety of the right lower extremity, with large 5.4 cm fluid-filled blister along the anterolateral right lower leg. Multiple smaller blisters along the medial right thigh. No evidence of abscess. 3. Diffuse vascular calcifications seen. Vasculature not well assessed without contrast. 4. Small bilateral knee joint effusions, slightly larger on the right. Electronically Signed   By: Roanna Raider M.D.   On: 09/13/2016 23:30        Scheduled Meds: . calcitonin (salmon)  1 spray Alternating Nares Daily  . cinacalcet  90 mg Oral Q supper  . gabapentin  100 mg Oral BID  . lanthanum  1,000 mg Oral TID WC  . LORazepam  1 mg Oral QHS  . multivitamin  1 tablet Oral QHS  . piperacillin-tazobactam (ZOSYN)  IV   3.375 g Intravenous Q12H  . sertraline  100 mg Oral Daily  . [START ON 09/15/2016] vancomycin  500 mg Intravenous Q M,W,F-HD   Continuous Infusions:   LOS: 1 day    Time spent:40 min    WOODS, Roselind Messier, MD [facility] Pager [phone]  If 7PM-7AM, please contact night-coverage [email or web address] Password [email or web address] 09/14/2016, 2:08 PM

## 2016-09-14 NOTE — Consult Note (Signed)
[facility] Renal Consultation Note    Indication for Consultation:  Management of ESRD/hemodialysis; anemia, hypertension/volume and secondary hyperparathyroidism  HPI: [name] Small is a 25 y.o. 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 Her past medical history is significant for ESRD secondary to PKD, s/p failed kidney tx 2012, cardiomyopathy with ICD (EF in 06/2016 12%), HTN,  severe uncontrolled hyperparathyroidism. She was most recently hospitalized from 10/22-11/10/17 with T7,T8,T12 vertebral compression fractures and volume overload related to noncompliance with HD. She had a new right femoral AVG placed during admission on 08/31/16.  Graft has not been used for Hd. She presented to ED yesterday with right leg pain and swelling. She reports having leg pain and blistering since she was discharged but felt it was worse yesterday. She was evaluated by vascular surgeon who plans to remove the graft Friday. Labs today K 5.1 Ca 9.4 Na 140 WBC 16.6 Hgb 9.5. She has been started on empiric Vanc/Zosyn Following her most recent discharge she transferred to [facility] for Hd. Her last HD was on 11/20 for 3 hours 27 mins. She has been attending HD since discharge though she did miss HD yesterday, she says because her transportation was late.  Her main complaints today are severe right leg pain. She reports blisters on both legs for several weeks. She is currently living in an apartment by herself.     Past Medical History:  Diagnosis Date  . 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 Anemia   . Anxiety   . Cardiomyopathy   . Cellulitis and abscess of face 03/22/2013  . CHF (congestive heart failure) (HCC)   . Chronic anticoagulation   . Chronic pain   . Depression   . DVT (deep venous thrombosis) (HCC) ~ 2014   BLE  . Dysrhythmia    at times per pt.  . 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 ESRD (end stage renal disease) on dialysis [facility])    "MWF; [facility]" (08/15/2016)  . Fracture, thoracic vertebra (HCC) 07/2016   "T7-T12"  . GERD (gastroesophageal reflux disease)   . H/O transfusion of packed red blood cells   . Heart murmur   . Hemodialysis patient (HCC)    Mon. Wed. Fri  . Hyperkalemia 09/2015  . Hypertension   . Narcotic abuse, continuous   . Osteoporosis   . Pelvic fracture (HCC) 06/2015   "on the right"  . Polycystic kidney disease   . Pulmonary emboli (HCC) 01/2012   Bilateral, moderate clot burden, areas of pulmonary infarction and central necrosis  . Renal insufficiency   . Sickle cell anemia (HCC)    Past Surgical History:  Procedure Laterality Date  . AV FISTULA PLACEMENT Bilateral    "right side stopped working & never had it revised" (04/11/2016)  . AV FISTULA PLACEMENT Right 08/01/2016   Procedure: INSERTION OF ARTERIOVENOUS (AV) GORE-TEX GRAFT RIGHT ARM;  Surgeon: Chuck Hint, MD;  Location: [facility] OR;  Service: Vascular;  Laterality: Right;  . AV FISTULA PLACEMENT Right 08/31/2016   Procedure: INSERTION OF ARTERIOVENOUS (AV) GORE-TEX GRAFT- RIGHT FEMORAL LOOP GRAFT;  Surgeon: Maeola Harman, MD;  Location: [facility] OR;  Service: Vascular;  Laterality: Right;  . AVGG REMOVAL Right 08/03/2016   Procedure: REMOVAL OF ARTERIOVENOUS GORETEX GRAFT (AVGG);  Surgeon: Maeola Harman, MD;  Location: [facility] OR;  Service:  Vascular;  Laterality: Right;  . CARDIAC CATHETERIZATION    . IMPLANTABLE CARDIOVERTER DEFIBRILLATOR IMPLANT Right 12/2014  . INCISION AND DRAINAGE ABSCESS Right 03/21/2013   Procedure: INCISION AND DRAINAGE RIGHT CHEEK ABSCESS REMOVAL OF FOREIGN BODY;  Surgeon: Serena Colonel, MD;  Location[facility] OR;  Service: ENT;  Laterality: Right;  . INSERTION OF DIALYSIS CATHETER Right Mar 09, 2016   thigh, Dr. Wyn Quaker, [facility]  . INSERTION OF  DIALYSIS CATHETER Left 09/02/2016   Procedure: Insertion of Dialysis Catheter Left Femoral Vein;  Surgeon: Maeola Harman, MD;  Location: [facility] OR;  Service: Vascular;  Laterality: Left;  . 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 NEPHRECTOMY Right     kidney placed in 2008, and body rejected in 2012   . PERIPHERAL VASCULAR CATHETERIZATION Left 03/09/2016   Procedure: A/V Shuntogram/Fistulagram;  Surgeon: Annice Needy, MD;  Location: [facility] INVASIVE CV LAB;  Service: Cardiovascular;  Laterality: Left;  . PERIPHERAL VASCULAR CATHETERIZATION N/A 05/09/2016   Procedure: Dialysis/Perma Catheter Removal;  Surgeon: Renford Dills, MD;  Location: [facility] INVASIVE CV LAB;  Service: Cardiovascular;  Laterality: N/A;  . PERIPHERAL VASCULAR CATHETERIZATION N/A 07/12/2016   Procedure: Dialysis/Perma Catheter Insertion;  Surgeon: Annice Needy, MD;  Location: [facility] INVASIVE CV LAB;  Service: Cardiovascular;  Laterality: N/A;  . PERIPHERAL VASCULAR CATHETERIZATION Right 07/31/2016   Procedure: Upper Extremity Venography;  Surgeon: Maeola Harman, MD;  Location: [facility] INVASIVE CV LAB;  Service: Cardiovascular;  Laterality: Right;  . REVISON OF ARTERIOVENOUS FISTULA Left 03/22/2016   Procedure: REVISON OF ARTERIOVENOUS FISTULA ( ARTEGRAFT );  Surgeon: Annice Needy, MD;  Location: [facility] ORS;  Service: Vascular;  Laterality: Left;  . TONSILLECTOMY AND ADENOIDECTOMY  ~ 2000   Family History  Problem Relation Age of Onset  . Polycystic kidney disease Father   . Hypertension Father    Social History:  reports that she quit smoking about 9 months ago. Her smoking use included Cigarettes. She smoked 0.00 packs per day for 1.00 year. She has never used smokeless tobacco. She reports that she does not drink alcohol or use drugs. Allergies  Allergen Reactions  . Aspirin Other (See Comments)    Interacts with Coreg  . Tramadol Anaphylaxis  . Vicodin [Hydrocodone-Acetaminophen] Hives  . Buprenorphine Hcl Itching     Ok with oxycodone  . Iohexol Itching  . Morphine And Related Itching and Other (See Comments)    Ok with oxycodone    Prior to Admission medications   Medication Sig Start Date End Date Taking? Authorizing Provider  calcitonin, salmon, (MIACALCIN) 200 UNIT/ACT nasal spray Place 1 spray into alternate nostrils daily. 09/01/16  Yes Daniel J Angiulli, PA-C  calcitRIOL (ROCALTROL) 0.5 MCG capsule Take 2 capsules (1 mcg total) by mouth every Monday, Wednesday, and Friday with hemodialysis. 09/01/16  Yes Daniel J Angiulli, PA-C  cinacalcet (SENSIPAR) 30 MG tablet Take 3 tablets (90 mg total) by mouth daily with supper. 09/01/16  Yes Daniel J Angiulli, PA-C  famotidine (PEPCID) 20 MG tablet Take 1 tablet (20 mg total) by mouth 2 (two) times daily as needed for heartburn or indigestion. 09/06/16  Yes Ankit Karis Juba, MD  gabapentin (NEURONTIN) 100 MG capsule Take 1 capsule (100 mg total) by mouth 2 (two) times daily. 09/05/16  Yes Quentin Angst, MD  lanthanum (FOSRENOL) 1000 MG chewable tablet Chew 1 tablet (1,000 mg total) by mouth 3 (three) times daily with meals. 09/01/16  Yes Daniel J Angiulli, PA-C  lidocaine (LIDODERM)  5 % Place 1 patch onto the skin daily. Remove & Discard patch within 12 hours or as directed by MD 09/01/16  Yes Mcarthur Rossetti Angiulli, PA-C  LORazepam (ATIVAN) 1 MG tablet Take 1 tablet (1 mg total) by mouth at bedtime. 09/05/16  Yes Quentin Angst, MD  methocarbamol (ROBAXIN) 500 MG tablet Take 1 tablet (500 mg total) by mouth every 6 (six) hours as needed for muscle spasms. 09/01/16  Yes Daniel J Angiulli, PA-C  multivitamin (RENA-VIT) TABS tablet Take 1 tablet by mouth daily. 09/01/16  Yes Daniel J Angiulli, PA-C  naproxen (NAPROSYN) 500 MG tablet Take 1 tablet (500 mg total) by mouth 2 (two) times daily with a meal. 09/01/16  Yes Daniel J Angiulli, PA-C  Oxycodone HCl 10 MG TABS Take 1 tablet (10 mg total) by mouth every 4 (four) hours as needed. 09/05/16  Yes Quentin Angst, MD  sertraline (ZOLOFT) 100 MG tablet Take 1 tablet (100 mg total) by mouth daily. 09/01/16  Yes Daniel J Angiulli, PA-C   Current Facility-Administered Medications  Medication Dose Route Frequency Provider Last Rate Last Dose  . calcitonin (salmon) (MIACALCIN/FORTICAL) nasal spray 1 spray  1 spray Alternating Nares Daily Eduard Clos, MD   1 spray at 09/14/16 1030  . [START ON 09/15/2016] calcitRIOL (ROCALTROL) capsule 1 mcg  1 mcg Oral Q M,W,F-HD Eduard Clos, MD      . cinacalcet (SENSIPAR) tablet 90 mg  90 mg Oral Q supper Eduard Clos, MD      . famotidine (PEPCID) tablet 20 mg  20 mg Oral BID PRN Eduard Clos, MD      . gabapentin (NEURONTIN) capsule 100 mg  100 mg Oral BID Eduard Clos, MD   100 mg at 09/14/16 1030  . HYDROmorphone (DILAUDID) injection 0.5 mg  0.5 mg Intravenous Q4H PRN Eduard Clos, MD   0.5 mg at 09/14/16 0705  . lanthanum (FOSRENOL) chewable tablet 1,000 mg  1,000 mg Oral TID WC Eduard Clos, MD      . LORazepam (ATIVAN) tablet 1 mg  1 mg Oral QHS Eduard Clos, MD   1 mg at 09/14/16 0228  . methocarbamol (ROBAXIN) tablet 500 mg  500 mg Oral Q6H PRN Eduard Clos, MD   500 mg at 09/14/16 0348  . multivitamin (RENA-VIT) tablet 1 tablet  1 tablet Oral QHS Eduard Clos, MD      . ondansetron [facility]) tablet 4 mg  4 mg Oral Q6H PRN Eduard Clos, MD       Or  . ondansetron [facility]) injection 4 mg  4 mg Intravenous Q6H PRN Eduard Clos, MD      . piperacillin-tazobactam (ZOSYN) IVPB 3.375 g  3.375 g Intravenous Q12H Stevphen Rochester, RPH   3.375 g at 09/14/16 1030  . sertraline (ZOLOFT) tablet 100 mg  100 mg Oral Daily Eduard Clos, MD   100 mg at 09/14/16 1030  . [START ON 09/15/2016] vancomycin (VANCOCIN) 500 mg in sodium chloride 0.9 % 100 mL IVPB  500 mg Intravenous Q M,W,F-HD Stevphen Rochester, [facility]       Labs: Basic Metabolic Panel:  Recent Labs Lab 09/13/16 1805  09/13/16 1945 09/14/16 0539  NA 140 139 140  K 3.9 3.9 5.1  CL 95* 98* 97*  CO2 26  --  27  GLUCOSE 93 85 123*  BUN 40* 39* 48*  CREATININE 5.80* 5.90* 6.55*  CALCIUM 9.2  --  9.4   Liver Function Tests: No results for input(s): AST, ALT, ALKPHOS, BILITOT, PROT, ALBUMIN in the last 168 hours. No results for input(s): LIPASE, AMYLASE in the last 168 hours. No results for input(s): AMMONIA in the last 168 hours. CBC:  Recent Labs Lab 09/13/16 1805 09/13/16 1945 09/14/16 0539  WBC 13.9*  --  16.6*  NEUTROABS 11.2*  --   --   HGB 9.6* 12.2 9.5*  HCT 30.9* 36.0 31.2*  MCV 90.1  --  89.9  PLT 355  --  362   Cardiac Enzymes: No results for input(s): CKTOTAL, CKMB, CKMBINDEX, TROPONINI in the last 168 hours. CBG:  Recent Labs Lab 09/14/16 0418 09/14/16 0747  GLUCAP 136* 128*   Iron Studies: No results for input(s): IRON, TIBC, TRANSFERRIN, FERRITIN in the last 72 hours. Studies/Results: Ct Extrem Lower Wo Cm Bil  Result Date: 09/13/2016 CLINICAL DATA:  Acute onset of right leg swelling and large blister along the right lower leg. Right foot pain. Initial encounter. EXAM: CT OF THE LOWER BILATERAL EXTREMITY WITHOUT CONTRAST TECHNIQUE: Multidetector CT imaging of the lower bilateral extremity was performed according to the standard protocol. COMPARISON:  Left lower extremity venous Doppler ultrasound performed 01/25/2015 FINDINGS: Bones/Joint/Cartilage There is no evidence of fracture or dislocation. The visualized osseous structures appear intact. There is marked chronic deformity of the right pubic rami and right side of the pubic symphysis, with heterotopic bone formation. No definite focal osseous erosions are seen. The cartilage is not well assessed on CT. Ligaments Suboptimally assessed by CT. Muscles and Tendons The visualized musculature is grossly unremarkable. The tendon structures are unremarkable in appearance. No intramuscular hematoma is seen. Soft tissues There is  diffuse soft tissue edema involving the right hip and entirety of the right lower extremity, with a large 5.4 cm subcutaneous fluid-filled blister along the anterolateral right lower leg. Multiple smaller blisters are seen along the medial right thigh. The vasculature is not well assessed without contrast. Diffuse arterial calcification is noted throughout both lower extremities, with a vascular graft noted at the anterior right thigh. Small bilateral knee joint effusions are noted, slightly larger on the right. No abnormal focal fluid collections are seen to suggest abscess. IMPRESSION: 1. No evidence of fracture or dislocation. 2. Diffuse soft tissue edema involving the right hip and entirety of the right lower extremity, with large 5.4 cm fluid-filled blister along the anterolateral right lower leg. Multiple smaller blisters along the medial right thigh. No evidence of abscess. 3. Diffuse vascular calcifications seen. Vasculature not well assessed without contrast. 4. Small bilateral knee joint effusions, slightly larger on the right. Electronically Signed   By: Roanna Raider M.D.   On: 09/13/2016 23:30    ROS: As per HPI otherwise negative.  Physical Exam: Vitals:   09/14/16 0430 09/14/16 0602 09/14/16 0700 09/14/16 0953  BP: 103/64 (!) 90/50 109/83 (!) 88/59  Pulse: (!) 107  (!) 105 100  Resp: 18     Temp: 99.6 F (37.6 C)   98.6 F (37 C)  TempSrc: Oral   Oral  SpO2: 93%   95%  Weight:      Height:         General: Frail chronically-ill appearing AAF, appears uncomfortable  Head: NCAT sclera not icteric MMM Neck: Supple.  Lungs: CTA bilaterally without wheezes, rales, or rhonchi. Breathing is unlabored. Heart: RRR no rub   Abdomen: soft NT + BS Lower extremities: right lower extremity with diffuse edema/tenderness - necrotic open lesions over  upper right thigh, blister right calf.  Small necrotic lesion lt calf Neuro: A & O  X 3. Moves all extremities spontaneously. Psych:  Responds  to questions appropriately with a normal affect. Dialysis Access: L fem TDC/ R fem AVG tender to palpation, + bruit  Dialysis Orders:  [place]  MWF 4h Opiflux 180 BFR 400/800 2K/2Ca EDW 49.5kg Heparin 2000 U IV Bolus q HD  Hectorol 1mcg IV q HD  Aranesp 60 mcg IV  q week (not given yet)  Assessment/Plan: 1.  Right LE edema/ulceration - per admit - s/p R fem AVG placement on 11/9 - poorly healing 1. VVS following plan removal of AVG tomorrow  2. 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 Briant CedarMattingly requesting specimens for path during revision 3. On Vanc/Zosyn for possible cellulitis  2.  ESRD -  MWF - plan HD tomorrow AM  3.  Hypertension/volume  - BP controlled no OP BP meds / no gross volume excess by exam  4.  Anemia  - Hgb 9.5  Aranesp 60 - give with HD tomorrow  5.  Metabolic bone disease -  Ca 9.4 - OP P 7.6 PTH 3041 - will cont VDRA/Renvela/Sensipar - check renal panel in am  6.  Nutrition - renal diet/vitamins  7. Thoracic compression fracture 8. NICM EF 12%  Tomasa Blasegechi Grace Ejigiri PA-C [facility] Pager [phone] 09/14/2016, 11:19 AM   I have seen and examined this patient and agree with plan per Susann GivensGrace Ejigiri.  25yo BF with ESRD and long hx of non compliance (doing better now) Co swelling and pain in Rt leg post AVG placement.  She has developed an eschar in Rt upper lat thigh and blisters in Rt thigh and rt lower leg.  Also has calciphylactic looking lesion in lt calf.  Plan is for HD tomorrow AM then AVG removal.  Spoke to Dr Darrick PennaFields today to get some tissue when removes AVG to send to path to look for calciphylaxis.  I would DC hectorol for now Meklit Cotta T,MD 09/14/2016 1:05 PM

## 2016-09-14 NOTE — H&P (Signed)
History and Physical    [name] Small ZOX:[id number] DOB: [birth year] DOA: 09/13/2016  PCP: Jeanann Lewandowsky, MD  Patient coming from: Home.  Chief Complaint: Right lower extremity swelling pain and ulcers.  HPI: [name] Small is a 25 y.o. female with ESRD secondary to polycystic kidney disease, chronic systolic heart failure status post AICD placement last EF measured in September 2017 was 12% and recently has had T-spine fractures presents to the ER because of increasing swelling of the right lower extremity with ulcerations and blisters. CT scan was done which was showing diffuse edema. Patient has had recent AV graft placed on right lower extremity and gets her dialysis done on the left lower extremity dialysis catheter. On exam patient has eschar over the recent AV graft placement on the right lower extremity with blisters in the lower extremity both left and right. There is diffuse edema of the right lower extremity.  ED Course: On-call vascular surgeon Dr. Randie Heinz was consulted. And also was started on empiric antibiotics.  Review of Systems: As per HPI, rest all negative.   Past Medical History:  Diagnosis Date  . 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 Anemia   . Anxiety   . Cardiomyopathy   . Cellulitis and abscess of face 03/22/2013  . CHF (congestive heart failure) (HCC)   . Chronic anticoagulation   . Chronic pain   . Depression   . DVT (deep venous thrombosis) (HCC) ~ 2014   BLE  . Dysrhythmia    at times per pt.  . 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 ESRD (end stage renal disease) on dialysis [facility])    "MWF; [facility]" (08/15/2016)  . Fracture, thoracic vertebra (HCC) 07/2016   "T7-T12"  . GERD (gastroesophageal reflux disease)   . H/O transfusion of packed red blood cells   . Heart murmur   .  Hemodialysis patient (HCC)    Mon. Wed. Fri  . Hyperkalemia 09/2015  . Hypertension   . Narcotic abuse, continuous   . Osteoporosis   . Pelvic fracture (HCC) 06/2015   "on the right"  . Polycystic kidney disease   . Pulmonary emboli (HCC) 01/2012   Bilateral, moderate clot burden, areas of pulmonary infarction and central necrosis  . Renal insufficiency   . Sickle cell anemia (HCC)     Past Surgical History:  Procedure Laterality Date  . AV FISTULA PLACEMENT Bilateral    "right side stopped working & never had it revised" (04/11/2016)  . AV FISTULA PLACEMENT Right 08/01/2016   Procedure: INSERTION OF ARTERIOVENOUS (AV) GORE-TEX GRAFT RIGHT ARM;  Surgeon: Chuck Hint, MD;  Location: [facility] OR;  Service: Vascular;  Laterality: Right;  . AV FISTULA PLACEMENT Right 08/31/2016   Procedure: INSERTION OF ARTERIOVENOUS (AV) GORE-TEX GRAFT- RIGHT FEMORAL LOOP GRAFT;  Surgeon: Maeola Harman, MD;  Location: [facility] OR;  Service: Vascular;  Laterality: Right;  . AVGG REMOVAL Right 08/03/2016   Procedure: REMOVAL OF ARTERIOVENOUS GORETEX GRAFT (AVGG);  Surgeon: Maeola Harman, MD;  Location: [facility] OR;  Service: Vascular;  Laterality: Right;  . CARDIAC CATHETERIZATION    . IMPLANTABLE CARDIOVERTER DEFIBRILLATOR IMPLANT Right 12/2014  . INCISION AND DRAINAGE ABSCESS Right 03/21/2013   Procedure: INCISION AND DRAINAGE RIGHT CHEEK ABSCESS REMOVAL OF FOREIGN BODY;  Surgeon: Serena Colonel, MD;  Location: [facility] OR;  Service: ENT;  Laterality: Right;  . INSERTION  OF DIALYSIS CATHETER Right Mar 09, 2016   thigh, Dr. Wyn Quaker, [facility]  . INSERTION OF DIALYSIS CATHETER Left 09/02/2016   Procedure: Insertion of Dialysis Catheter Left Femoral Vein;  Surgeon: Maeola Harman, MD;  Location: [facility] OR;  Service: Vascular;  Laterality: Left;  . 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 NEPHRECTOMY Right     kidney placed in 2008, and body rejected in 2012   . PERIPHERAL VASCULAR CATHETERIZATION Left  03/09/2016   Procedure: A/V Shuntogram/Fistulagram;  Surgeon: Annice Needy, MD;  Location: [facility] INVASIVE CV LAB;  Service: Cardiovascular;  Laterality: Left;  . PERIPHERAL VASCULAR CATHETERIZATION N/A 05/09/2016   Procedure: Dialysis/Perma Catheter Removal;  Surgeon: Renford Dills, MD;  Location: [facility] INVASIVE CV LAB;  Service: Cardiovascular;  Laterality: N/A;  . PERIPHERAL VASCULAR CATHETERIZATION N/A 07/12/2016   Procedure: Dialysis/Perma Catheter Insertion;  Surgeon: Annice Needy, MD;  Location: [facility] INVASIVE CV LAB;  Service: Cardiovascular;  Laterality: N/A;  . PERIPHERAL VASCULAR CATHETERIZATION Right 07/31/2016   Procedure: Upper Extremity Venography;  Surgeon: Maeola Harman, MD;  Location: [facility] INVASIVE CV LAB;  Service: Cardiovascular;  Laterality: Right;  . REVISON OF ARTERIOVENOUS FISTULA Left 03/22/2016   Procedure: REVISON OF ARTERIOVENOUS FISTULA ( ARTEGRAFT );  Surgeon: Annice Needy, MD;  Location: [facility] ORS;  Service: Vascular;  Laterality: Left;  . TONSILLECTOMY AND ADENOIDECTOMY  ~ 2000     reports that she quit smoking about 9 months ago. Her smoking use included Cigarettes. She smoked 0.00 packs per day for 1.00 year. She has never used smokeless tobacco. She reports that she does not drink alcohol or use drugs.  Allergies  Allergen Reactions  . Aspirin Other (See Comments)    Interacts with Coreg  . Tramadol Anaphylaxis  . Vicodin [Hydrocodone-Acetaminophen] Hives  . Buprenorphine Hcl Itching    Ok with oxycodone  . Iohexol Itching  . Morphine And Related Itching and Other (See Comments)    Ok with oxycodone     Family History  Problem Relation Age of Onset  . Polycystic kidney disease Father   . Hypertension Father     Prior to Admission medications   Medication Sig Start Date End Date Taking? Authorizing Provider  calcitonin, salmon, (MIACALCIN) 200 UNIT/ACT nasal spray Place 1 spray into alternate nostrils daily. 09/01/16  Yes Daniel J Angiulli, PA-C    calcitRIOL (ROCALTROL) 0.5 MCG capsule Take 2 capsules (1 mcg total) by mouth every Monday, Wednesday, and Friday with hemodialysis. 09/01/16  Yes Daniel J Angiulli, PA-C  cinacalcet (SENSIPAR) 30 MG tablet Take 3 tablets (90 mg total) by mouth daily with supper. 09/01/16  Yes Daniel J Angiulli, PA-C  famotidine (PEPCID) 20 MG tablet Take 1 tablet (20 mg total) by mouth 2 (two) times daily as needed for heartburn or indigestion. 09/06/16  Yes Ankit Karis Juba, MD  gabapentin (NEURONTIN) 100 MG capsule Take 1 capsule (100 mg total) by mouth 2 (two) times daily. 09/05/16  Yes Quentin Angst, MD  lanthanum (FOSRENOL) 1000 MG chewable tablet Chew 1 tablet (1,000 mg total) by mouth 3 (three) times daily with meals. 09/01/16  Yes Daniel J Angiulli, PA-C  lidocaine (LIDODERM) 5 % Place 1 patch onto the skin daily. Remove & Discard patch within 12 hours or as directed by MD 09/01/16  Yes Mcarthur Rossetti Angiulli, PA-C  LORazepam (ATIVAN) 1 MG tablet Take 1 tablet (1 mg total) by mouth at bedtime. 09/05/16  Yes Quentin Angst, MD  methocarbamol (ROBAXIN)  500 MG tablet Take 1 tablet (500 mg total) by mouth every 6 (six) hours as needed for muscle spasms. 09/01/16  Yes Daniel J Angiulli, PA-C  multivitamin (RENA-VIT) TABS tablet Take 1 tablet by mouth daily. 09/01/16  Yes Daniel J Angiulli, PA-C  naproxen (NAPROSYN) 500 MG tablet Take 1 tablet (500 mg total) by mouth 2 (two) times daily with a meal. 09/01/16  Yes Daniel J Angiulli, PA-C  Oxycodone HCl 10 MG TABS Take 1 tablet (10 mg total) by mouth every 4 (four) hours as needed. 09/05/16  Yes Quentin Angstlugbemiga E Jegede, MD  sertraline (ZOLOFT) 100 MG tablet Take 1 tablet (100 mg total) by mouth daily. 09/01/16  Yes Charlton Amoraniel J Angiulli, PA-C    Physical Exam: Vitals:   09/13/16 2315 09/14/16 0000 09/14/16 0002 09/14/16 0053  BP: 115/70 102/81  (!) 113/50  Pulse: 111 111  (!) 114  Resp: 18 23  20   Temp:   98.7 F (37.1 C) 98.4 F (36.9 C)  TempSrc:   Oral  Oral  SpO2: 100% 100%  100%  Weight:    50.2 kg (110 lb 9.6 oz)  Height:          Const - Moderately built and nourished. Vitals:   09/13/16 2315 09/14/16 0000 09/14/16 0002 09/14/16 0053  BP: 115/70 102/81  (!) 113/50  Pulse: 111 111  (!) 114  Resp: 18 23  20   Temp:   98.7 F (37.1 C) 98.4 F (36.9 C)  TempSrc:   Oral Oral  SpO2: 100% 100%  100%  Weight:    50.2 kg (110 lb 9.6 oz)  Height:       Eyes: Anicteric mild pallor. ENMT: No discharge from the ears eyes nose or mouth. Neck: No mass felt. No JVD appreciated. Respiratory: No rhonchi or crepitations. Cardiovascular: S1-S2 heard. Abdomen: Soft nontender bowel sounds present. No guarding or rigidity. Musculoskeletal: Right lower extremity edema. Skin: Multiple blisters on the right lower ex 2001 on the left calf. Eschar over the right AV graft. Neurologic: Alert awake oriented to time place and person. Moves all extremities. Psychiatric: Appears normal.   Labs on Admission: I have personally reviewed following labs and imaging studies  CBC:  Recent Labs Lab 09/13/16 1805 09/13/16 1945  WBC 13.9*  --   NEUTROABS 11.2*  --   HGB 9.6* 12.2  HCT 30.9* 36.0  MCV 90.1  --   PLT 355  --    Basic Metabolic Panel:  Recent Labs Lab 09/13/16 1805 09/13/16 1945  NA 140 139  K 3.9 3.9  CL 95* 98*  CO2 26  --   GLUCOSE 93 85  BUN 40* 39*  CREATININE 5.80* 5.90*  CALCIUM 9.2  --    GFR: Estimated Creatinine Clearance: 11.6 mL/min (by C-G formula based on SCr of 5.9 mg/dL (H)). Liver Function Tests: No results for input(s): AST, ALT, ALKPHOS, BILITOT, PROT, ALBUMIN in the last 168 hours. No results for input(s): LIPASE, AMYLASE in the last 168 hours. No results for input(s): AMMONIA in the last 168 hours. Coagulation Profile: No results for input(s): INR, PROTIME in the last 168 hours. Cardiac Enzymes: No results for input(s): CKTOTAL, CKMB, CKMBINDEX, TROPONINI in the last 168 hours. BNP (last 3  results) No results for input(s): PROBNP in the last 8760 hours. HbA1C: No results for input(s): HGBA1C in the last 72 hours. CBG: No results for input(s): GLUCAP in the last 168 hours. Lipid Profile: No results for input(s): CHOL, HDL, LDLCALC, TRIG,  CHOLHDL, LDLDIRECT in the last 72 hours. Thyroid Function Tests: No results for input(s): TSH, T4TOTAL, FREET4, T3FREE, THYROIDAB in the last 72 hours. Anemia Panel: No results for input(s): VITAMINB12, FOLATE, FERRITIN, TIBC, IRON, RETICCTPCT in the last 72 hours. Urine analysis:    Component Value Date/Time   COLORURINE YELLOW 01/14/2013 1639   APPEARANCEUR CLOUDY (A) 01/14/2013 1639   LABSPEC 1.008 01/14/2013 1639   PHURINE 8.0 01/14/2013 1639   GLUCOSEU NEGATIVE 01/14/2013 1639   HGBUR SMALL (A) 01/14/2013 1639   BILIRUBINUR NEGATIVE 01/14/2013 1639   KETONESUR NEGATIVE 01/14/2013 1639   PROTEINUR 30 (A) 01/14/2013 1639   UROBILINOGEN 0.2 01/14/2013 1639   NITRITE NEGATIVE 01/14/2013 1639   LEUKOCYTESUR MODERATE (A) 01/14/2013 1639   Sepsis Labs: @LABRCNTIP (procalcitonin:4,lacticidven:4) )No results found for this or any previous visit (from the past 240 hour(s)).   Radiological Exams on Admission: Ct Extrem Lower Wo Cm Bil  Result Date: 09/13/2016 CLINICAL DATA:  Acute onset of right leg swelling and large blister along the right lower leg. Right foot pain. Initial encounter. EXAM: CT OF THE LOWER BILATERAL EXTREMITY WITHOUT CONTRAST TECHNIQUE: Multidetector CT imaging of the lower bilateral extremity was performed according to the standard protocol. COMPARISON:  Left lower extremity venous Doppler ultrasound performed 01/25/2015 FINDINGS: Bones/Joint/Cartilage There is no evidence of fracture or dislocation. The visualized osseous structures appear intact. There is marked chronic deformity of the right pubic rami and right side of the pubic symphysis, with heterotopic bone formation. No definite focal osseous erosions are seen.  The cartilage is not well assessed on CT. Ligaments Suboptimally assessed by CT. Muscles and Tendons The visualized musculature is grossly unremarkable. The tendon structures are unremarkable in appearance. No intramuscular hematoma is seen. Soft tissues There is diffuse soft tissue edema involving the right hip and entirety of the right lower extremity, with a large 5.4 cm subcutaneous fluid-filled blister along the anterolateral right lower leg. Multiple smaller blisters are seen along the medial right thigh. The vasculature is not well assessed without contrast. Diffuse arterial calcification is noted throughout both lower extremities, with a vascular graft noted at the anterior right thigh. Small bilateral knee joint effusions are noted, slightly larger on the right. No abnormal focal fluid collections are seen to suggest abscess. IMPRESSION: 1. No evidence of fracture or dislocation. 2. Diffuse soft tissue edema involving the right hip and entirety of the right lower extremity, with large 5.4 cm fluid-filled blister along the anterolateral right lower leg. Multiple smaller blisters along the medial right thigh. No evidence of abscess. 3. Diffuse vascular calcifications seen. Vasculature not well assessed without contrast. 4. Small bilateral knee joint effusions, slightly larger on the right. Electronically Signed   By: Roanna Raider M.D.   On: 09/13/2016 23:30     Assessment/Plan Principal Problem:   Edema of right lower extremity Active Problems:   ESRD (end stage renal disease) on dialysis (HCC)   Hypertension   AICD (automatic cardioverter/defibrillator) present   NICM (nonischemic cardiomyopathy) (HCC)   Cellulitis   Leg edema, right    1. Right lower extremity edema with blisters and ulceration - appreciate vascular surgery consult. Vascular surgery is planning to take patient to the or in a.m. for the graft revision due to steal, for which patient will be kept nothing by mouth except  medications. Patient is on empiric antibiotics for now to cover for possible cellulitis. Wound team consult for blisters.  2. ESRD on hemodialysis on Monday Wednesday and Friday - patient had missed  her dialysis yesterday. Patient does not look to be in any respiratory distress. Consult nephrologist in a.m. for dialysis. 3. Nonischemic cardiomyopathy status post AICD placement last EF measured was in September through [facility] 12%. Volume management per nephrology. Not on ACE/ARB due to low normal blood pressure. 4. Recent T-spine fracture and on pain medication. 5. Chronic anemia probably from ESRD - follow CBC.   DVT prophylaxis: Not on heparin or Lovenox due to patient going to the OR. May start SCDs or Lovenox after surgery per vascular surgeon. Code Status: Full code.  Family Communication: Discussed with patient.  Disposition Plan: Home.  Consults called: Vascular surgery.  Admission status: Inpatient.    Eduard Clos MD [facility] Pager [phone].  If 7PM-7AM, please contact night-coverage [email or web address] Password [email or web address]  09/14/2016, 1:56 AM

## 2016-09-14 NOTE — Progress Notes (Signed)
Pharmacy Antibiotic Note  [name] Small is a 25 y.o. female admitted on 09/13/2016 with cellulitis.  Pharmacy has been consulted for Vancomycin/Zosyn dosing. Pt having blisters over graft site and other areas of her legs. WBC mildly elevated. ESRD on HD MWF.   Plan: -Vancomycin 1000 mg IV x 1 already given, then 500 mg IV qHD MWF -Zosyn 3.375G IV q12h to be infused over 4 hours -Trend WBC, temp, HD schedule -Drug levels as indicated   Height: 5\' 3"  (160 cm) Weight: 110 lb 9.6 oz (50.2 kg) IBW/kg (Calculated) : 52.4  Temp (24hrs), Avg:98.5 F (36.9 C), Min:98.3 F (36.8 C), Max:98.7 F (37.1 C)   Recent Labs Lab 09/13/16 1805 09/13/16 1945 09/13/16 2130 09/14/16 0018  WBC 13.9*  --   --   --   CREATININE 5.80* 5.90*  --   --   LATICACIDVEN  --   --  2.38* 1.81    Estimated Creatinine Clearance: 11.6 mL/min (by C-G formula based on SCr of 5.9 mg/dL (H)).    Allergies  Allergen Reactions  . Aspirin Other (See Comments)    Interacts with Coreg  . Tramadol Anaphylaxis  . Vicodin [Hydrocodone-Acetaminophen] Hives  . Buprenorphine Hcl Itching    Ok with oxycodone  . Iohexol Itching  . Morphine And Related Itching and Other (See Comments)    Ok with oxycodone     Abran Duke 09/14/2016 2:02 AM

## 2016-09-14 NOTE — Progress Notes (Signed)
[facility] notified bp 86/49 HR 85. Ilean Skill LPN

## 2016-09-14 NOTE — Progress Notes (Signed)
[facility]  Subjective  - right leg pain   Objective (!) 88/59 100 98.6 F (37 C) (Oral) 18 95%  Intake/Output Summary (Last 24 hours) at 09/14/16 0959 Last data filed at 09/14/16 6579  Gross per 24 hour  Intake              250 ml  Output                0 ml  Net              250 ml   Right thigh graft with necrosis of skin right lateral thigh primarily, blister right pretibial region with swelling Left calf with dry eschar ulcer  Right posterior calf with scattered ulcers  Assessment/Planning: Poorly healing right thigh av graft with skin necrosis Removal right thigh graft by Dr Randie Heinz in OR tomorrow NPO p midnight HD early morning prior to graft removal per Dr Lockie Mola, Leonette Most 09/14/2016 9:59 AM --  Laboratory Lab Results:  Recent Labs  09/13/16 1805 09/13/16 1945 09/14/16 0539  WBC 13.9*  --  16.6*  HGB 9.6* 12.2 9.5*  HCT 30.9* 36.0 31.2*  PLT 355  --  362   BMET  Recent Labs  09/13/16 1805 09/13/16 1945 09/14/16 0539  NA 140 139 140  K 3.9 3.9 5.1  CL 95* 98* 97*  CO2 26  --  27  GLUCOSE 93 85 123*  BUN 40* 39* 48*  CREATININE 5.80* 5.90* 6.55*  CALCIUM 9.2  --  9.4    COAG Lab Results  Component Value Date   INR 1.35 06/17/2016   INR 1.55 (H) 04/12/2016   INR 1.35 03/31/2016   No results found for: PTT

## 2016-09-15 ENCOUNTER — Inpatient Hospital Stay (HOSPITAL_COMMUNITY): Payer: Medicare Other | Admitting: Certified Registered Nurse Anesthetist

## 2016-09-15 ENCOUNTER — Encounter (HOSPITAL_COMMUNITY)
Admission: EM | Disposition: A | Discharge status: Hospice | Payer: Self-pay | Source: Home / Self Care | Attending: Internal Medicine

## 2016-09-15 DIAGNOSIS — Z515 Encounter for palliative care: Secondary | ICD-10-CM

## 2016-09-15 HISTORY — PX: REMOVAL OF GRAFT: SHX6361

## 2016-09-15 LAB — GLUCOSE, CAPILLARY
GLUCOSE-CAPILLARY: 104 mg/dL — AB (ref 65–99)
GLUCOSE-CAPILLARY: 120 mg/dL — AB (ref 65–99)
GLUCOSE-CAPILLARY: 95 mg/dL (ref 65–99)

## 2016-09-15 LAB — CBC WITH DIFFERENTIAL/PLATELET
BASOS ABS: 0 10*3/uL (ref 0.0–0.1)
BASOS PCT: 0 %
EOS ABS: 0 10*3/uL (ref 0.0–0.7)
Eosinophils Relative: 0 %
HCT: 29.8 % — ABNORMAL LOW (ref 36.0–46.0)
Hemoglobin: 8.9 g/dL — ABNORMAL LOW (ref 12.0–15.0)
LYMPHS ABS: 1.2 10*3/uL (ref 0.7–4.0)
LYMPHS PCT: 5 %
MCH: 27.1 pg (ref 26.0–34.0)
MCHC: 29.9 g/dL — AB (ref 30.0–36.0)
MCV: 90.6 fL (ref 78.0–100.0)
MONO ABS: 1 10*3/uL (ref 0.1–1.0)
Monocytes Relative: 4 %
NEUTROS ABS: 22.2 10*3/uL — AB (ref 1.7–7.7)
Neutrophils Relative %: 91 %
PLATELETS: 376 10*3/uL (ref 150–400)
RBC: 3.29 MIL/uL — ABNORMAL LOW (ref 3.87–5.11)
RDW: 23.2 % — AB (ref 11.5–15.5)
WBC: 24.4 10*3/uL — ABNORMAL HIGH (ref 4.0–10.5)

## 2016-09-15 LAB — BASIC METABOLIC PANEL
Anion gap: 21 — ABNORMAL HIGH (ref 5–15)
BUN: 66 mg/dL — AB (ref 6–20)
CALCIUM: 7.5 mg/dL — AB (ref 8.9–10.3)
CO2: 21 mmol/L — ABNORMAL LOW (ref 22–32)
Chloride: 95 mmol/L — ABNORMAL LOW (ref 101–111)
Creatinine, Ser: 7.39 mg/dL — ABNORMAL HIGH (ref 0.44–1.00)
GFR calc Af Amer: 8 mL/min — ABNORMAL LOW (ref >60)
GFR, EST NON AFRICAN AMERICAN: 7 mL/min — AB (ref >60)
GLUCOSE: 106 mg/dL — AB (ref 65–99)
POTASSIUM: 5.5 mmol/L — AB (ref 3.5–5.1)
SODIUM: 137 mmol/L (ref 135–145)

## 2016-09-15 LAB — LACTIC ACID, PLASMA: LACTIC ACID, VENOUS: 1.9 mmol/L (ref 0.5–1.9)

## 2016-09-15 SURGERY — REMOVAL, GRAFT
Anesthesia: General | Laterality: Right

## 2016-09-15 MED ORDER — HEPARIN SODIUM (PORCINE) 1000 UNIT/ML DIALYSIS: 1000.0000 [IU] | INTRAMUSCULAR | Status: DC | PRN

## 2016-09-15 MED ORDER — VANCOMYCIN HCL IN DEXTROSE 500-5 MG/100ML-% IV SOLN
INTRAVENOUS | Status: AC
  Administered 2016-09-15: 500 mg via INTRAVENOUS_CENTRAL
  Filled 2016-09-15: qty 100

## 2016-09-15 MED ORDER — LIDOCAINE HCL (CARDIAC) 20 MG/ML IV SOLN
INTRAVENOUS | Status: DC | PRN
  Administered 2016-09-15: 20 mg via INTRATRACHEAL

## 2016-09-15 MED ORDER — HYDROMORPHONE HCL 1 MG/ML IJ SOLN
INTRAMUSCULAR | Status: DC
Start: 2016-09-15 — End: 2016-09-16
  Filled 2016-09-15: qty 0.5

## 2016-09-15 MED ORDER — SODIUM CHLORIDE 0.9 % IV SOLN: 100.0000 mL | INTRAVENOUS | Status: DC | PRN

## 2016-09-15 MED ORDER — MIDAZOLAM HCL 2 MG/2ML IJ SOLN
INTRAMUSCULAR | Status: AC
  Filled 2016-09-15: qty 2

## 2016-09-15 MED ORDER — HYDROMORPHONE HCL 1 MG/ML IJ SOLN
INTRAMUSCULAR | Status: AC
  Filled 2016-09-15: qty 1

## 2016-09-15 MED ORDER — ALTEPLASE 2 MG IJ SOLR: 2.0000 mg | Freq: Once | INTRAMUSCULAR | Status: DC | PRN

## 2016-09-15 MED ORDER — ONDANSETRON HCL 4 MG/2ML IJ SOLN: 4.0000 mg | Freq: Once | INTRAMUSCULAR | Status: DC | PRN

## 2016-09-15 MED ORDER — SODIUM CHLORIDE 0.9 % IV SOLN
INTRAVENOUS | Status: DC | PRN
  Administered 2016-09-15: 17:00:00 via INTRAVENOUS

## 2016-09-15 MED ORDER — HYDROMORPHONE HCL 1 MG/ML IJ SOLN
INTRAMUSCULAR | Status: AC
  Filled 2016-09-15: qty 0.5

## 2016-09-15 MED ORDER — LIDOCAINE HCL (PF) 1 % IJ SOLN: 5.0000 mL | INTRAMUSCULAR | Status: DC | PRN

## 2016-09-15 MED ORDER — FENTANYL CITRATE (PF) 100 MCG/2ML IJ SOLN
INTRAMUSCULAR | Status: DC
Start: 2016-09-15 — End: 2016-09-15
  Filled 2016-09-15: qty 2

## 2016-09-15 MED ORDER — FENTANYL CITRATE (PF) 100 MCG/2ML IJ SOLN: 25.0000 ug | INTRAMUSCULAR | Status: DC | PRN

## 2016-09-15 MED ORDER — PROPOFOL 10 MG/ML IV BOLUS
INTRAVENOUS | Status: DC | PRN
  Administered 2016-09-15: 80 mg via INTRAVENOUS
  Administered 2016-09-15: 20 mg via INTRAVENOUS

## 2016-09-15 MED ORDER — SODIUM CHLORIDE 0.9 % IV SOLN
INTRAVENOUS | Status: DC | PRN
  Administered 2016-09-15: 17:00:00

## 2016-09-15 MED ORDER — DIPHENHYDRAMINE HCL 25 MG PO CAPS
25.0000 mg | ORAL_CAPSULE | Freq: Three times a day (TID) | ORAL | Status: DC | PRN
  Administered 2016-09-15: 25 mg via ORAL
  Filled 2016-09-15: qty 1

## 2016-09-15 MED ORDER — DARBEPOETIN ALFA 60 MCG/0.3ML IJ SOSY
PREFILLED_SYRINGE | INTRAMUSCULAR | Status: AC
  Filled 2016-09-15: qty 0.3

## 2016-09-15 MED ORDER — FENTANYL CITRATE (PF) 250 MCG/5ML IJ SOLN
INTRAMUSCULAR | Status: DC | PRN
  Administered 2016-09-15 (×3): 50 ug via INTRAVENOUS

## 2016-09-15 MED ORDER — FENTANYL CITRATE (PF) 250 MCG/5ML IJ SOLN
INTRAMUSCULAR | Status: AC
  Filled 2016-09-15: qty 5

## 2016-09-15 MED ORDER — PHENYLEPHRINE HCL 10 MG/ML IJ SOLN
INTRAVENOUS | Status: DC | PRN
  Administered 2016-09-15: 10 ug/min via INTRAVENOUS

## 2016-09-15 MED ORDER — 0.9 % SODIUM CHLORIDE (POUR BTL) OPTIME
TOPICAL | Status: DC | PRN
  Administered 2016-09-15: 1000 mL

## 2016-09-15 MED ORDER — HYDROMORPHONE HCL 1 MG/ML IJ SOLN
1.0000 mg | Freq: Once | INTRAMUSCULAR | Status: AC
Start: 2016-09-15 — End: 2016-09-16
  Administered 2016-09-16: 1 mg via INTRAVENOUS
  Filled 2016-09-15 (×2): qty 1

## 2016-09-15 MED ORDER — ACETAMINOPHEN 325 MG PO TABS
650.0000 mg | ORAL_TABLET | Freq: Four times a day (QID) | ORAL | Status: DC | PRN
  Administered 2016-09-15: 650 mg via ORAL
  Filled 2016-09-15: qty 2

## 2016-09-15 MED ORDER — MIDAZOLAM HCL 5 MG/5ML IJ SOLN
INTRAMUSCULAR | Status: DC | PRN
  Administered 2016-09-15 (×2): 1 mg via INTRAVENOUS

## 2016-09-15 MED ORDER — DIPHENHYDRAMINE HCL 25 MG PO CAPS
ORAL_CAPSULE | ORAL | Status: AC
  Filled 2016-09-15: qty 1

## 2016-09-15 MED ORDER — HYDROMORPHONE HCL 1 MG/ML IJ SOLN
0.2500 mg | INTRAMUSCULAR | Status: DC | PRN
  Administered 2016-09-15 (×4): 0.5 mg via INTRAVENOUS

## 2016-09-15 SURGICAL SUPPLY — 41 items
ARMBAND PINK RESTRICT EXTREMIT (MISCELLANEOUS) IMPLANT
CANISTER SUCTION 2500CC (MISCELLANEOUS) ×2 IMPLANT
CANNULA VESSEL W/WING WO/VALVE (CANNULA) ×2 IMPLANT
CLAMP POUCH DRAINAGE QUIET (OSTOMY) ×2 IMPLANT
CLIP TI MEDIUM 6 (CLIP) ×2 IMPLANT
CLIP TI WIDE RED SMALL 6 (CLIP) ×2 IMPLANT
CONT SPEC 4OZ CLIKSEAL STRL BL (MISCELLANEOUS) ×2 IMPLANT
DERMABOND ADVANCED (GAUZE/BANDAGES/DRESSINGS)
DERMABOND ADVANCED .7 DNX12 (GAUZE/BANDAGES/DRESSINGS) IMPLANT
DRAIN PENROSE 1/4X12 LTX STRL (WOUND CARE) ×4 IMPLANT
DRAPE ORTHO SPLIT 77X108 STRL (DRAPES) ×1
DRAPE SURG ORHT 6 SPLT 77X108 (DRAPES) ×1 IMPLANT
DRSG PAD ABDOMINAL 8X10 ST (GAUZE/BANDAGES/DRESSINGS) ×4 IMPLANT
ELECT REM PT RETURN 9FT ADLT (ELECTROSURGICAL) ×2
ELECTRODE REM PT RTRN 9FT ADLT (ELECTROSURGICAL) ×1 IMPLANT
GAUZE XEROFORM 5X9 LF (GAUZE/BANDAGES/DRESSINGS) ×4 IMPLANT
GEL ULTRASOUND 20GR AQUASONIC (MISCELLANEOUS) IMPLANT
GLOVE BIO SURGEON STRL SZ 6.5 (GLOVE) ×2 IMPLANT
GLOVE BIO SURGEON STRL SZ7.5 (GLOVE) ×2 IMPLANT
GLOVE BIOGEL PI IND STRL 6.5 (GLOVE) ×5 IMPLANT
GLOVE BIOGEL PI INDICATOR 6.5 (GLOVE) ×5
GLOVE SURG SS PI 6.5 STRL IVOR (GLOVE) ×4 IMPLANT
GOWN STRL REUS W/ TWL LRG LVL3 (GOWN DISPOSABLE) ×2 IMPLANT
GOWN STRL REUS W/ TWL XL LVL3 (GOWN DISPOSABLE) ×1 IMPLANT
GOWN STRL REUS W/TWL LRG LVL3 (GOWN DISPOSABLE) ×2
GOWN STRL REUS W/TWL XL LVL3 (GOWN DISPOSABLE) ×1
KIT BASIN OR (CUSTOM PROCEDURE TRAY) ×2 IMPLANT
KIT ROOM TURNOVER OR (KITS) ×2 IMPLANT
NS IRRIG 1000ML POUR BTL (IV SOLUTION) ×2 IMPLANT
PACK CV ACCESS (CUSTOM PROCEDURE TRAY) ×2 IMPLANT
PAD ARMBOARD 7.5X6 YLW CONV (MISCELLANEOUS) ×4 IMPLANT
PIN SAFETY STERILE (MISCELLANEOUS) ×2 IMPLANT
SUT ETHILON 3 0 PS 1 (SUTURE) ×2 IMPLANT
SUT MNCRL AB 4-0 PS2 18 (SUTURE) IMPLANT
SUT PROLENE 6 0 BV (SUTURE) IMPLANT
SUT VIC AB 3-0 SH 27 (SUTURE) ×1
SUT VIC AB 3-0 SH 27X BRD (SUTURE) ×1 IMPLANT
SWAB COLLECTION DEVICE MRSA (MISCELLANEOUS) ×2 IMPLANT
TAPE CLOTH SURG 4X10 WHT LF (GAUZE/BANDAGES/DRESSINGS) ×2 IMPLANT
UNDERPAD 30X30 (UNDERPADS AND DIAPERS) IMPLANT
WATER STERILE IRR 1000ML POUR (IV SOLUTION) ×2 IMPLANT

## 2016-09-15 NOTE — Progress Notes (Signed)
  Progress Note    09/15/2016 1:55 PM Day of Surgery  Subjective: complains of pain in both legs  Vitals:   09/15/16 1000 09/15/16 1033  BP: (!) 107/57 108/73  Pulse: 92 (!) 104  Resp: 19 20  Temp:  97.8 F (36.6 C)    Physical Exam: [email or web address] Right thigh av graft with pulsatility, overlying skin necrosis R leg blister site with dressing cdi  CBC    Component Value Date/Time   WBC 24.4 (H) 09/15/2016 0353   RBC 3.29 (L) 09/15/2016 0353   HGB 8.9 (L) 09/15/2016 0353   HCT 29.8 (L) 09/15/2016 0353   PLT 376 09/15/2016 0353   MCV 90.6 09/15/2016 0353   MCH 27.1 09/15/2016 0353   MCHC 29.9 (L) 09/15/2016 0353   RDW 23.2 (H) 09/15/2016 0353   LYMPHSABS 1.2 09/15/2016 0353   MONOABS 1.0 09/15/2016 0353   EOSABS 0.0 09/15/2016 0353   BASOSABS 0.0 09/15/2016 0353    BMET    Component Value Date/Time   NA 137 09/15/2016 0353   K 5.5 (H) 09/15/2016 0353   CL 95 (L) 09/15/2016 0353   CO2 21 (L) 09/15/2016 0353   GLUCOSE 106 (H) 09/15/2016 0353   BUN 66 (H) 09/15/2016 0353   CREATININE 7.39 (H) 09/15/2016 0353   CALCIUM 7.5 (L) 09/15/2016 0353   GFRNONAA 7 (L) 09/15/2016 0353   GFRAA 8 (L) 09/15/2016 0353    INR    Component Value Date/Time   INR 1.35 06/17/2016 0229     Intake/Output Summary (Last 24 hours) at 09/15/16 1355 Last data filed at 09/15/16 1033  Gross per 24 hour  Intake              435 ml  Output             1500 ml  Net            -1065 ml     Assessment:  25 y.o. female is here with R thigh skin necrosis following thigh graft placement  Plan: OR today for R femoral graft excision and biopsy of skin   Marvie Brevik C. Randie Heinz, MD [facility] Office: [phone] Pager: [phone]  09/15/2016 1:55 PM

## 2016-09-15 NOTE — Anesthesia Procedure Notes (Signed)
Procedure Name: LMA Insertion Date/Time: 09/15/2016 4:55 PM Performed by: Brien Mates D Pre-anesthesia Checklist: Patient identified, Emergency Drugs available, Suction available, Patient being monitored and Timeout performed Patient Re-evaluated:Patient Re-evaluated prior to inductionOxygen Delivery Method: Circle system utilized Preoxygenation: Pre-oxygenation with 100% oxygen Intubation Type: IV induction Ventilation: Mask ventilation without difficulty LMA: LMA inserted LMA Size: 3.0 Number of attempts: 1 Placement Confirmation: positive ETCO2 and breath sounds checked- equal and bilateral Tube secured with: Tape Dental Injury: Teeth and Oropharynx as per pre-operative assessment

## 2016-09-15 NOTE — Progress Notes (Signed)
PROGRESS NOTE [facility]   Monarch MillVictoria Small   ZOX:[id number] DOB: December 17, 1990  DOA: 09/13/2016 PCP: Jeanann LewandowskyJEGEDE, OLUGBEMIGA, MD   Brief Narrative:  [name] a 25 y.o. 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 Recently hospitalized from 10/22-11/10/17 with T7,T8,T12 vertebral compression fractures and volume overload related to noncompliance with HD. She had a new right femoral AVG placed during admission on 08/31/16. Graft has not been used for HD.  Following her most recent discharge she transferred to [facility] HD. Her last HD was on 11/20 for 3 hours 27 mins. She has been attending HD since discharge though she did miss HD yesterday  Presents to the ER because of increasing swelling of the right lower extremity with ulcerations and blisters. CT scan was done which was showing diffuse edema. [name] Small has had recent AV graft placed on right lower extremity and gets her dialysis done on the left lower extremity dialysis catheter. On exam [name] Small has eschar over the recent AV graft placement on the right lower extremity with blisters in the lower extremity both left and right. There is diffuse edema of the right lower extremity.  Subjective: Seen and examined complaining of bilateral lower extremity pain. [name] Small is status post dialysis. No other complaints. Vascular surgeon planning for right femoral graft excision biopsy of skin.   Assessment & Plan: Right lower extremity edema with blisters and ulceration/infected AV graft Right thigh  Poorly healing right thigh av graft with skin necrosis Removal right thigh graft by Dr Randie Heinzain in OR  Blood culture pending Continue current antibiotics [name] Small  with painful bilateral lower extremity, underneath this is secondary to sickle cell given [name] Small dehydration Continue gentle hydration given [name] Small has stage renal disease  Hypotension - blood pressure improving - steroids were given yesterday BP much better today will continue to monitor DC steroids  ESRD on hemodialysis M/W/F Had dialysis today Renal recommendations appreciated  Nonischemic cardiomyopathy S/P AICD placement - no signs of fluid overload EF 06/2016 =12% Strict I&O Daily weight  T7,T8,T12 vertebral compression fractures Pain management as needed, careful as BP is likely  Chronic anemia - hemoglobin stable at baseline Most likely from ESRD   DVT prophylaxis: Subcutaneous heparin Code Status: Full Family Communication: None Disposition Plan: Home when medically stable  Consultants:   Vascular surgery Dr. Randie Heinzain  Nephrology Dr. Briant CedarMattingly  Procedures:  11/22 CT bilateral lower extremity:-No evidence of fracture or dislocation. -. Diffuse soft tissue edema involving right hip/entirety of right lower extremity, with large 5.4 cm fluid-filled blister along the anterolateral right lower leg. Multiple smaller blisters along the medial right thigh. Negative abscess - Small bilateral knee joint effusions, Rt>> Lt   Antimicrobials:  Zosyn 11/22  Vancomycin 11/22   Objective: Vitals:   09/15/16 0900 09/15/16 0930 09/15/16 1000 09/15/16 1033  BP: 130/70 100/60 (!) 107/57 108/73  Pulse: 86 92 92 (!) 104  Resp: (!) 22 17 19 20   Temp:    97.8 F (36.6 C)  TempSrc:    Oral  SpO2:    96%  Weight:    49.5 kg (109 lb 2 oz)  Height:        Intake/Output Summary (Last 24 hours) at 09/15/16 1755 Last data filed at 09/15/16 1747  Gross per 24 hour  Intake  415 ml  Output             1500 ml  Net            -1085 ml   Filed Weights   09/15/16 0423 09/15/16 0700 09/15/16 1033  Weight: 50.4 kg (111 lb 1.8 oz) 51 kg (112 lb 7 oz) 49.5 kg (109 lb  2 oz)    Examination:  General exam: [name] Small in mild distress crying, cachectic.  Respiratory system: Clear to auscultation. No wheezes,crackle or rhonchi Cardiovascular system: S1 & S2 heard, RRR. No JVD, murmurs, rubs or gallops Gastrointestinal system: Abdomen is nondistended, soft and nontender. Normal bowel sounds heard. Central nervous system: Alert and oriented. No focal neurological deficits. Extremities: Bilateral lower extremity with difficulty moving due to pain. Right lower extremity with edema up to the thigh. Tenderness to palpation multiple sisters and lower extremity   Skin: Blister and extremities. Fistula in the left arm, Shiley catheter right lower extremity Psychiatry: Withdrawn and sad, flat affect  Data Reviewed: I have personally reviewed following labs and imaging studies  CBC:  Recent Labs Lab 09/13/16 1805 09/13/16 1945 09/14/16 0539 09/15/16 0353  WBC 13.9*  --  16.6* 24.4*  NEUTROABS 11.2*  --   --  22.2*  HGB 9.6* 12.2 9.5* 8.9*  HCT 30.9* 36.0 31.2* 29.8*  MCV 90.1  --  89.9 90.6  PLT 355  --  362 376   Basic Metabolic Panel:  Recent Labs Lab 09/13/16 1805 09/13/16 1945 09/14/16 0539 09/14/16 1519 09/15/16 0353  NA 140 139 140  --  137  K 3.9 3.9 5.1  --  5.5*  CL 95* 98* 97*  --  95*  CO2 26  --  27  --  21*  GLUCOSE 93 85 123*  --  106*  BUN 40* 39* 48*  --  66*  CREATININE 5.80* 5.90* 6.55*  --  7.39*  CALCIUM 9.2  --  9.4  --  7.5*  MG  --   --   --  2.2  --    GFR: Estimated Creatinine Clearance: 9.1 mL/min (by C-G formula based on SCr of 7.39 mg/dL (H)). Liver Function Tests: No results for input(s): AST, ALT, ALKPHOS, BILITOT, PROT, ALBUMIN in the last 168 hours. No results for input(s): LIPASE, AMYLASE in the last 168 hours. No results for input(s): AMMONIA in the last 168 hours. Coagulation Profile: No results for input(s): INR, PROTIME in the last 168 hours. Cardiac Enzymes: No results for input(s): CKTOTAL, CKMB,  CKMBINDEX, TROPONINI in the last 168 hours. BNP (last 3 results) No results for input(s): PROBNP in the last 8760 hours. HbA1C: No results for input(s): HGBA1C in the last 72 hours. CBG:  Recent Labs Lab 09/14/16 1552 09/14/16 2010 09/15/16 0029 09/15/16 0405 09/15/16 1116  GLUCAP 135* 153* 120* 104* 95   Lipid Profile: No results for input(s): CHOL, HDL, LDLCALC, TRIG, CHOLHDL, LDLDIRECT in the last 72 hours. Thyroid Function Tests: No results for input(s): TSH, T4TOTAL, FREET4, T3FREE, THYROIDAB in the last 72 hours. Anemia Panel: No results for input(s): VITAMINB12, FOLATE, FERRITIN, TIBC, IRON, RETICCTPCT in the last 72 hours. Sepsis Labs:  Recent Labs Lab 09/13/16 2130 09/14/16 0018 09/15/16 0353  LATICACIDVEN 2.38* 1.81 1.9    Recent Results (from the past 240 hour(s))  Culture, blood (routine x 2)     Status: None (Preliminary result)   Collection Time: 09/14/16  3:23 PM  Result Value Ref Range Status   Specimen Description  BLOOD RIGHT HAND  Final   Special Requests IN PEDIATRIC BOTTLE 2CC  Final   Culture NO GROWTH < 24 HOURS  Final   Report Status PENDING  Incomplete  Culture, blood (routine x 2)     Status: None (Preliminary result)   Collection Time: 09/14/16  3:34 PM  Result Value Ref Range Status   Specimen Description BLOOD RIGHT ARM  Final   Special Requests IN PEDIATRIC BOTTLE 2CC  Final   Culture NO GROWTH < 24 HOURS  Final   Report Status PENDING  Incomplete      Radiology Studies: Ct Extrem Lower Wo Cm Bil  Result Date: 09/13/2016 CLINICAL DATA:  Acute onset of right leg swelling and large blister along the right lower leg. Right foot pain. Initial encounter. EXAM: CT OF THE LOWER BILATERAL EXTREMITY WITHOUT CONTRAST TECHNIQUE: Multidetector CT imaging of the lower bilateral extremity was performed according to the standard protocol. COMPARISON:  Left lower extremity venous Doppler ultrasound performed 01/25/2015 FINDINGS: Bones/Joint/Cartilage  There is no evidence of fracture or dislocation. The visualized osseous structures appear intact. There is marked chronic deformity of the right pubic rami and right side of the pubic symphysis, with heterotopic bone formation. No definite focal osseous erosions are seen. The cartilage is not well assessed on CT. Ligaments Suboptimally assessed by CT. Muscles and Tendons The visualized musculature is grossly unremarkable. The tendon structures are unremarkable in appearance. No intramuscular hematoma is seen. Soft tissues There is diffuse soft tissue edema involving the right hip and entirety of the right lower extremity, with a large 5.4 cm subcutaneous fluid-filled blister along the anterolateral right lower leg. Multiple smaller blisters are seen along the medial right thigh. The vasculature is not well assessed without contrast. Diffuse arterial calcification is noted throughout both lower extremities, with a vascular graft noted at the anterior right thigh. Small bilateral knee joint effusions are noted, slightly larger on the right. No abnormal focal fluid collections are seen to suggest abscess. IMPRESSION: 1. No evidence of fracture or dislocation. 2. Diffuse soft tissue edema involving the right hip and entirety of the right lower extremity, with large 5.4 cm fluid-filled blister along the anterolateral right lower leg. Multiple smaller blisters along the medial right thigh. No evidence of abscess. 3. Diffuse vascular calcifications seen. Vasculature not well assessed without contrast. 4. Small bilateral knee joint effusions, slightly larger on the right. Electronically Signed   By: Roanna Raider M.D.   On: 09/13/2016 23:30    Scheduled Meds: . [MAR Hold] calcitonin (salmon)  1 spray Alternating Nares Daily  . [MAR Hold] cinacalcet  90 mg Oral Q supper  . [MAR Hold] darbepoetin (ARANESP) injection - DIALYSIS  60 mcg Intravenous Q Fri-HD  . [MAR Hold] gabapentin  100 mg Oral BID  . [MAR Hold]  heparin subcutaneous  5,000 Units Subcutaneous Q8H  . [MAR Hold] hydrocortisone sodium succinate  100 mg Intravenous Q8H  . HYDROmorphone      . [MAR Hold] lanthanum  1,000 mg Oral TID WC  . [MAR Hold] LORazepam  1 mg Oral QHS  . [MAR Hold] multivitamin  1 tablet Oral QHS  . [MAR Hold] piperacillin-tazobactam (ZOSYN)  IV  3.375 g Intravenous Q12H  . [MAR Hold] sertraline  100 mg Oral Daily  . [MAR Hold] vancomycin  500 mg Intravenous Q M,W,F-HD   Continuous Infusions: . dextrose 5 % and 0.45% NaCl 30 mL/hr at 09/15/16 0216     LOS: 2 days    Dorma Russell  Edward Jolly, MD [facility] Pager [phone]  If 7PM-7AM, please contact night-coverage [email or web address] Password [facility] 09/15/2016, 5:55 PM

## 2016-09-15 NOTE — Transfer of Care (Signed)
Immediate Anesthesia Transfer of Care Note  Patient: [name] Small  Procedure(s) Performed: Procedure(s): REMOVAL OF RIGHT THIGH GRAFT (Right)  Patient Location: PACU  Anesthesia Type:General  Level of Consciousness: awake  Airway & Oxygen Therapy: Patient Spontanous Breathing  Post-op Assessment: Report given to RN and Post -op Vital signs reviewed and stable  Post vital signs: Reviewed and stable  Last Vitals:  Vitals:   09/15/16 1000 09/15/16 1033  BP: (!) 107/57 108/73  Pulse: 92 (!) 104  Resp: 19 20  Temp:  36.6 C    Last Pain:  Vitals:   09/15/16 1450  TempSrc:   PainSc: 9       Patients Stated Pain Goal: 5 (09/15/16 1450)  Complications: No apparent anesthesia complications

## 2016-09-15 NOTE — Anesthesia Preprocedure Evaluation (Addendum)
Anesthesia Evaluation  Patient identified by MRN, date of birth, ID band Patient awake    Reviewed: Allergy & Precautions, NPO status , Patient's Chart, lab work & pertinent test results  Airway Mallampati: II  TM Distance: >3 FB Neck ROM: Full    Dental  (+) Teeth Intact, Dental Advisory Given   Pulmonary former smoker,    breath sounds clear to auscultation       Cardiovascular hypertension,  Rhythm:Regular Rate:Normal + Friction Rub    Neuro/Psych    GI/Hepatic   Endo/Other    Renal/GU      Musculoskeletal   Abdominal   Peds  Hematology   Anesthesia Other Findings   Reproductive/Obstetrics                             Anesthesia Physical Anesthesia Plan  ASA: III  Anesthesia Plan: General   Post-op Pain Management:    Induction: Intravenous  Airway Management Planned: LMA  Additional Equipment:   Intra-op Plan:   Post-operative Plan:   Informed Consent: I have reviewed the patients History and Physical, chart, labs and discussed the procedure including the risks, benefits and alternatives for the proposed anesthesia with the patient or authorized representative who has indicated his/her understanding and acceptance.   Dental advisory given  Plan Discussed with: CRNA and Anesthesiologist  Anesthesia Plan Comments: (ESRD S/P failed kidney transplant HD earleir today K- 5.5 predialysis Nonischemic CM with AICD Conservation officer, historic buildings) in place EF 20-25% Polycystic kidney disease Sickle cell disease per patient has tolerated GA in past without difficulty H/H- 8.9/29  Plan GA with LMA)       Anesthesia Quick Evaluation

## 2016-09-15 NOTE — Progress Notes (Signed)
Consult received for GOC discussion specifically code status on 09/14/16. Went by to see pt at 1650. Pt in OR. Will FU 09/16/16 Eduard Roux, ANP

## 2016-09-15 NOTE — Op Note (Signed)
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  Asst: Karsten Ro PA-C  Specimens: Tissue sent to pathology for examination for possible calciphylaxis. Culture and Gram stain of graft sent as well.  Operative details: After obtaining informed consent, the patient taken the operating. The patient was placed in supine position operating table. After induction of general anesthesia the patient's entire right thigh was prepped and draped in the usual sterile fashion. Next a pre-existing oblique incision in the groin was reopened carried through the subcutaneous tissues down to level the graft. The arterial limb of the graft was doubly ligated with 2-0 silk ties. The venous limb of the graft was ligated with a 2-0 silk tie. The graft was then transected above the level of the thigh and removed with gentle traction. A counterincision was made in the distal thigh and a portion of necrotic tissue debrided to be sent to pathology for examination for calciphylaxis. The wounds were thoroughly irrigated with normal saline solution. A Penrose drain was placed in the lateral and medial limbs of the tunnel and secured to the skin with a safety pin. The wounds were then thoroughly irrigated once again with normal saline solution. Subcutaneous tissues reapproximated using a running 3-0 Vicryl suture. The skin was closed with a 4 0 Vicryl subcuticular stitch. The patient tolerated the procedure well and there were no complications. Instrument sponge and needle count was correct in the case. The patient was taken to recovery room in stable condition.  Fabienne Bruns, MD [facility] Office: [phone] Pager: [phone]

## 2016-09-15 NOTE — Procedures (Signed)
Pt seen on HD.  Ap 200 Vp 210  BFR 350.  To go for surgery after HD.

## 2016-09-16 LAB — BASIC METABOLIC PANEL
Anion gap: 19 — ABNORMAL HIGH (ref 5–15)
BUN: 37 mg/dL — AB (ref 6–20)
CHLORIDE: 95 mmol/L — AB (ref 101–111)
CO2: 24 mmol/L (ref 22–32)
CREATININE: 4.77 mg/dL — AB (ref 0.44–1.00)
Calcium: 7.8 mg/dL — ABNORMAL LOW (ref 8.9–10.3)
GFR calc Af Amer: 14 mL/min — ABNORMAL LOW (ref >60)
GFR calc non Af Amer: 12 mL/min — ABNORMAL LOW (ref >60)
Glucose, Bld: 81 mg/dL (ref 65–99)
Potassium: 3.3 mmol/L — ABNORMAL LOW (ref 3.5–5.1)
SODIUM: 138 mmol/L (ref 135–145)

## 2016-09-16 LAB — GLUCOSE, CAPILLARY
GLUCOSE-CAPILLARY: 119 mg/dL — AB (ref 65–99)
GLUCOSE-CAPILLARY: 103 mg/dL — AB (ref 65–99)
Glucose-Capillary: 113 mg/dL — ABNORMAL HIGH (ref 65–99)
Glucose-Capillary: 71 mg/dL (ref 65–99)
Glucose-Capillary: 75 mg/dL (ref 65–99)
Glucose-Capillary: 87 mg/dL (ref 65–99)
Glucose-Capillary: 92 mg/dL (ref 65–99)

## 2016-09-16 MED ORDER — METHOCARBAMOL 500 MG PO TABS
500.0000 mg | ORAL_TABLET | Freq: Three times a day (TID) | ORAL | Status: DC
  Administered 2016-09-16 – 2016-09-27 (×32): 500 mg via ORAL
  Filled 2016-09-16 (×33): qty 1

## 2016-09-16 MED ORDER — HYDROMORPHONE HCL 1 MG/ML IJ SOLN
1.0000 mg | INTRAMUSCULAR | Status: DC | PRN
  Administered 2016-09-16 – 2016-09-18 (×10): 1 mg via INTRAVENOUS
  Filled 2016-09-16 (×9): qty 1

## 2016-09-16 MED ORDER — NEPRO/CARBSTEADY PO LIQD
237.0000 mL | Freq: Two times a day (BID) | ORAL | Status: DC
  Administered 2016-09-17 – 2016-09-18 (×4): 237 mL via ORAL

## 2016-09-16 MED ORDER — NEPRO/CARBSTEADY PO LIQD
237.0000 mL | Freq: Three times a day (TID) | ORAL | Status: DC
  Administered 2016-09-17 – 2016-09-27 (×16): 237 mL via ORAL

## 2016-09-16 MED ORDER — ACETAMINOPHEN 500 MG PO TABS
500.0000 mg | ORAL_TABLET | Freq: Three times a day (TID) | ORAL | Status: DC
  Administered 2016-09-16 – 2016-09-27 (×18): 500 mg via ORAL
  Filled 2016-09-16 (×23): qty 1

## 2016-09-16 MED ORDER — HEPARIN SODIUM (PORCINE) 5000 UNIT/ML IJ SOLN: 5000.0000 [IU] | Freq: Three times a day (TID) | INTRAMUSCULAR | Status: DC

## 2016-09-16 MED ORDER — HYDROMORPHONE HCL 1 MG/ML IJ SOLN
1.0000 mg | INTRAMUSCULAR | Status: AC
  Administered 2016-09-16: 1 mg via INTRAVENOUS
  Filled 2016-09-16: qty 1

## 2016-09-16 NOTE — Consult Note (Signed)
Consultation Note Date: 09/16/2016   Patient Name: [name] Small  DOB: [birth year]  MRN: [id number]  Age / Sex: 25 y.o., female  PCP: [name] Angst, MD Referring Physician: Lenox Ponds, MD  Reason for Consultation: Establishing goals of care, Pain control and Psychosocial/spiritual support  HPI/Patient Profile: 26 y.o. female  with past medical history of End-stage renal disease secondary to polycystic disease, status post transplant in 2008, transplant failure in 2012, nephrectomy in 2012, now on hemodialysis, sickle cell anemia, chronic systolic heart failure with AICD placement (EF September 2000 1712%), PE DVT, opioid usage since age 31, noncompliance with hemodialysis and pain medicine admitted on 09/13/2016 with increased edema, and right lower extremity swelling and pain. Patient has been in the hospital 13 times in 2017. Patient has now acute on chronic pain secondary to recent compression fractures at levels T7, T8, T12 as well as pelvis. Patient also went to the OR on 09/15/2016 for skin graft revision on the right. She is verbalizing back pain for the past 2 months and severe right leg pain 1 week. She states the pain is been so severe that she is only walking a few steps in her apartment. Her boyfriend comes over and does her dishes and cooking. She does not have a car and this is made it difficult for her to be compliant with some of her appointments as well as her worsening pain. She states her parents are very actively involved in her life and she describes a close relationship with them. She states her mother does all of her grocery shopping for her..   Clinical Assessment and Goals of Care: Patient is alert and appears to be in a great deal of pain. She describes her pain in her back and her right leg. She reports the worst her pain is been overnight is a 10 and the best it's been  overnight is a 4. She is currently rating it as a 7. Her pain is impacting ability even to walk to the bathroom and as needed a 2 person assist to the bedside commode.  [name] She is unmarried. No children. Her parents [name] Small and [name] Small would be her healthcare proxy in the event she was unable to speak for herself. I did ask her if she and her parents had ever discussed "what if" scenarios given her long-standing medical illnesses, hospitalizations, and complications. She states her parents don't like to talk about these things and she herself struggles between wanting to give up at times and seeing so much to live for. She states her mother struggles especially with feeling like she suffering and her father pushing her to "fight".    SUMMARY OF RECOMMENDATIONS   Full code Full aggressive treatment including rehospitalization I feel the patient should align herself with the pain management service in [facility]. Her primary care physician, Dr. Jamesetta Small, is currently prescribing her opioids and is in [place]. Code Status/Advance Care Planning:  Full code  Symptom Management:   Pain: After staffing case with her attending, I did increase her Dilaudid to 1 mg IV every 4 hours as needed; will schedule Tylenol 500 mg every 8 hours as well as Robaxin 500 mg every 8 hours. Patient is on low-dose Neurontin. It would probably take much higher dosing and I'm not sure how well this is tolerated and end-stage renal patients to provide pain relief  Palliative Prophylaxis:   Bowel Regimen, Eye Care, Frequent Pain Assessment, Oral Care and Turn Reposition  Additional Recommendations (Limitations, Scope, Preferences):  Full Scope Treatment  Psycho-social/Spiritual:   Desire for further Chaplaincy support:no   Prognosis:   Unable to determine  Discharge Planning: To Be Determined      Primary Diagnoses: Present on Admission: .  Cellulitis . Edema of right lower extremity . Hypertension . AICD (automatic cardioverter/defibrillator) present . Leg edema, right   I have reviewed the medical record, interviewed the patient and family, and examined the patient. The following aspects are pertinent.  Past Medical History:  Diagnosis Date  . 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 Anemia   . Anxiety   . Cardiomyopathy   . Cellulitis and abscess of face 03/22/2013  . CHF (congestive heart failure) (HCC)   . Chronic anticoagulation   . Chronic pain   . Depression   . DVT (deep venous thrombosis) (HCC) ~ 2014   BLE  . Dysrhythmia    at times per pt.  . 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 ESRD (end stage renal disease) on dialysis [facility])    "MWF; [facility]" (08/15/2016)  . Fracture, thoracic vertebra (HCC) 07/2016   "T7-T12"  . GERD (gastroesophageal reflux disease)   . H/O transfusion of packed red blood cells   . Heart murmur   . Hemodialysis patient (HCC)    Mon. Wed. Fri  . Hyperkalemia 09/2015  . Hypertension   . Narcotic abuse, continuous   . Osteoporosis   . Pelvic fracture (HCC) 06/2015   "on the right"  . Polycystic kidney disease   . Pulmonary emboli (HCC) 01/2012   Bilateral, moderate clot burden, areas of pulmonary infarction and central necrosis  . Renal insufficiency   . Sickle cell anemia (HCC)    Social History   Social History  . Marital status: Single    Spouse name: N/A  . Number of children: N/A  . Years of education: N/A   Social History Main Topics  . Smoking status: Former Smoker    Packs/day: 0.00    Years: 1.00    Types: Cigarettes    Quit date: 12/08/2015  . Smokeless tobacco: Never Used  . Alcohol use No  . Drug use: No  . Sexual activity: Not Currently    Birth control/ protection: None   Other  Topics Concern  . None   Social History Narrative   Was smoking 3 cigs per day. Lives at home with roommates, has some family locally.    Family History  Problem Relation Age of Onset  . Polycystic kidney disease Father   . Hypertension Father    Scheduled Meds: . acetaminophen  500 mg Oral Q8H  . calcitonin (salmon)  1 spray Alternating Nares Daily  . cinacalcet  90 mg Oral Q supper  . darbepoetin (ARANESP) injection - DIALYSIS  60 mcg Intravenous Q Fri-HD  . feeding supplement (NEPRO  CARB STEADY)  237 mL Oral TID WC  . feeding supplement (NEPRO CARB STEADY)  237 mL Oral BID BM  . gabapentin  100 mg Oral BID  . heparin subcutaneous  5,000 Units Subcutaneous Q8H  . lanthanum  1,000 mg Oral TID WC  . LORazepam  1 mg Oral QHS  . methocarbamol  500 mg Oral Q8H  . multivitamin  1 tablet Oral QHS  . piperacillin-tazobactam (ZOSYN)  IV  3.375 g Intravenous Q12H  . sertraline  100 mg Oral Daily  . vancomycin  500 mg Intravenous Q M,W,F-HD   Continuous Infusions: . dextrose 5 % and 0.45% NaCl 30 mL/hr at 09/15/16 0216   PRN Meds:.acetaminophen, diphenhydrAMINE, famotidine, HYDROmorphone (DILAUDID) injection, ondansetron **OR** ondansetron (ZOFRAN) IV Medications Prior to Admission:  Prior to Admission medications   Medication Sig Start Date End Date Taking? Authorizing Provider  calcitonin, salmon, (MIACALCIN) 200 UNIT/ACT nasal spray Place 1 spray into alternate nostrils daily. 09/01/16  Yes Daniel J Angiulli, PA-C  calcitRIOL (ROCALTROL) 0.5 MCG capsule Take 2 capsules (1 mcg total) by mouth every Monday, Wednesday, and Friday with hemodialysis. 09/01/16  Yes Daniel J Angiulli, PA-C  cinacalcet (SENSIPAR) 30 MG tablet Take 3 tablets (90 mg total) by mouth daily with supper. 09/01/16  Yes Daniel J Angiulli, PA-C  famotidine (PEPCID) 20 MG tablet Take 1 tablet (20 mg total) by mouth 2 (two) times daily as needed for heartburn or indigestion. 09/06/16  Yes Ankit Karis Juba, MD   gabapentin (NEURONTIN) 100 MG capsule Take 1 capsule (100 mg total) by mouth 2 (two) times daily. 09/05/16  Yes [name] Angst, MD  lanthanum (FOSRENOL) 1000 MG chewable tablet Chew 1 tablet (1,000 mg total) by mouth 3 (three) times daily with meals. 09/01/16  Yes Daniel J Angiulli, PA-C  lidocaine (LIDODERM) 5 % Place 1 patch onto the skin daily. Remove & Discard patch within 12 hours or as directed by MD 09/01/16  Yes Mcarthur Rossetti Angiulli, PA-C  LORazepam (ATIVAN) 1 MG tablet Take 1 tablet (1 mg total) by mouth at bedtime. 09/05/16  Yes [name] Angst, MD  methocarbamol (ROBAXIN) 500 MG tablet Take 1 tablet (500 mg total) by mouth every 6 (six) hours as needed for muscle spasms. 09/01/16  Yes Daniel J Angiulli, PA-C  multivitamin (RENA-VIT) TABS tablet Take 1 tablet by mouth daily. 09/01/16  Yes Daniel J Angiulli, PA-C  naproxen (NAPROSYN) 500 MG tablet Take 1 tablet (500 mg total) by mouth 2 (two) times daily with a meal. 09/01/16  Yes Daniel J Angiulli, PA-C  Oxycodone HCl 10 MG TABS Take 1 tablet (10 mg total) by mouth every 4 (four) hours as needed. 09/05/16  Yes [name] Angst, MD  sertraline (ZOLOFT) 100 MG tablet Take 1 tablet (100 mg total) by mouth daily. 09/01/16  Yes Daniel J Angiulli, PA-C   Allergies  Allergen Reactions  . Aspirin Other (See Comments)    Interacts with Coreg  . Tramadol Anaphylaxis  . Vicodin [Hydrocodone-Acetaminophen] Hives  . Buprenorphine Hcl Itching    Ok with oxycodone  . Iohexol Itching  . Morphine And Related Itching and Other (See Comments)    Ok with oxycodone    Review of Systems  Constitutional: Positive for activity change, appetite change and fatigue.  HENT: Negative.   Respiratory: Negative.   Cardiovascular: Positive for palpitations.  Gastrointestinal: Negative.   Endocrine: Negative.   Genitourinary: Positive for flank pain.  Musculoskeletal: Positive for back pain.  Skin:  itching  Allergic/Immunologic: Negative.    Neurological: Positive for weakness.  Hematological: Negative.   Psychiatric/Behavioral: Positive for dysphoric mood.    Physical Exam  Vital Signs: BP (!) 100/59 (BP Location: Right Arm)   Pulse 89   Temp 98.2 F (36.8 C) (Oral)   Resp 20   Ht 5\' 3"  (1.6 m)   Wt 51 kg (112 lb 6.4 oz)   SpO2 98%   BMI 19.91 kg/m  Pain Assessment: 0-10 POSS *See Group Information*: 1-Acceptable,Awake and alert Pain Score: 8    SpO2: SpO2: 98 % O2 Device:SpO2: 98 % O2 Flow Rate: .   IO: Intake/output summary:  Intake/Output Summary (Last 24 hours) at 09/16/16 1740 Last data filed at 09/16/16 1249  Gross per 24 hour  Intake              680 ml  Output               50 ml  Net              630 ml    LBM: Last BM Date: 09/15/16 Baseline Weight: Weight: 51.3 kg (113 lb) Most recent weight: Weight: 51 kg (112 lb 6.4 oz)     Palliative Assessment/Data:   Flowsheet Rows   Flowsheet Row Most Recent Value  Intake Tab  Referral Department  Hospitalist  Unit at Time of Referral  Med/Surg Unit  Palliative Care Primary Diagnosis  Nephrology  Date Notified  09/14/16  Palliative Care Type  New Palliative care  Reason for referral  Clarify Goals of Care, Pain, Psychosocial or Spiritual support  Date of Admission  09/13/16  Date first seen by Palliative Care  09/16/16  # of days Palliative referral response time  2 Day(s)  # of days IP prior to Palliative referral  1  Clinical Assessment  Palliative Performance Scale Score  40%  Pain Max last 24 hours  10  Pain Min Last 24 hours  4  Dyspnea Max Last 24 Hours  0  Dyspnea Min Last 24 hours  0  Nausea Max Last 24 Hours  0  Nausea Min Last 24 Hours  0  Anxiety Max Last 24 Hours  5  Anxiety Min Last 24 Hours  2  Psychosocial & Spiritual Assessment  Palliative Care Outcomes  Patient/Family meeting held?  Yes  Who was at the meeting?  pt  Palliative Care Outcomes  Improved pain interventions, Provided advance care planning  Palliative  Care follow-up planned  Yes, Facility      Time In: 1400 Time Out: 1510 Time Total: 70 min Greater than 50%  of this time was spent counseling and coordinating care related to the above assessment and plan.Staffed with Dr. Edward JollySilva  Signed by: Irean HongSarah Grace Tomara Youngberg, NP   Please contact Palliative Medicine Team phone at [phone] for questions and concerns.  For individual provider: See Loretha StaplerAmion

## 2016-09-16 NOTE — Progress Notes (Signed)
Sp-oke to md about pt b/p of 95/60 and c/o pain .  md stated pt b/p wopuld have to be greater thasn 100 sbp to ademinsiter dilaudid . rechd b/p and it was 111/74 pain medication adminsitered as pt stated pain is 10 /10  Non 0-10 scale

## 2016-09-16 NOTE — Progress Notes (Signed)
[name] Small Progress Note   Dialysis Orders: [place]  MWF 4h Opiflux 180 BFR 400/800 2K/2Ca EDW 49.5kg left fem TDC Heparin 2000 U IV Bolus q HD  Hectorol 1mcg IV q HD  Aranesp 60 mcg IV  q week (not given yet)  Assessment/Plan: 1. Right LE edema/ulceration - s/p removal of right thigh graft 11/24 by Dr. 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 Cellulitis - on empiric Vanc and Zosyn 2. ESRD -MWF - next HD Monday needs standing wts K 3.3 today post HD yest 3. Anemia - hgb 8.9 pre surgery -Aranesp 60 given 11/24 4. 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 HTN/volume - net UF 1.5 11/24; post wt 51 - nonstanding 6. Nutrition - alb 3.2 11/11- suspect it is lower now, add supplements 7. Symptom management - Palliative care to see today ; Toradol would be ok to give short term post op; pain management post discharge will be an issue - she is "between" PCP providers and [place] [facility] [place] [facility] [place]. She will need a lot of emotional support as she has a long history of nonadherence to dialysis. I have explained to her that healing from calciphylaxis is a VERY slow process.  Sheffield SliderMartha B Bergman, PA-C  Kidney Small Beeper [phone] 09/16/2016,8:25 AM  LOS: 3 days  I have seen and examined this patient and agree with plan per Bard HerbertMarty Bergman.  Note that tissue was sent to path as suspect calciphylaxis. Next HD Mon Dawana Asper T,MD 09/16/2016 9:27 AM Subjective:   Wants to talk about sleeping pill for bedtime and pain meds; also who will manage leg dressings  Objective Vitals:   09/15/16 2209 09/15/16 2334 09/16/16 0002 09/16/16 0415  BP: 101/71 95/60 111/74 104/70  Pulse: 97 98 95 96  Resp: 18   16  Temp: 100 F (37.8 C) 99.3 F (37.4 C)  98.4 F  (36.9 C)  TempSrc: Oral   Oral  SpO2: 99% 99% 98% 100%  Weight:    51 kg (112 lb 6.4 oz)  Height:    5\' 3"  (1.6 m)   Physical Exam General: [facility] Heart: RRR Lungs: no rales Abdomen: soft NT Extremities: right leg ++ edema to foot - thigh dressed; left LE no edema; calciphylaxis wound on thigh dressed [facility]   Additional Objective Labs: Basic Metabolic Panel:  Recent Labs Lab 09/14/16 0539 09/15/16 0353 09/16/16 0516  NA 140 137 138  K 5.1 5.5* 3.3*  CL 97* 95* 95*  CO2 27 21* 24  GLUCOSE 123* 106* 81  BUN 48* 66* 37*  CREATININE 6.55* 7.39* 4.77*  CALCIUM 9.4 7.5* 7.8*   Liver Function Tests: No results for input(s): AST, ALT, ALKPHOS, BILITOT, PROT, ALBUMIN in the last 168 hours. No results for input(s): LIPASE, AMYLASE in the last 168 hours. CBC:  Recent Labs Lab 09/13/16 1805 09/13/16 1945 09/14/16 0539 09/15/16 0353  WBC 13.9*  --  16.6* 24.4*  NEUTROABS 11.2*  --   --  22.2*  HGB 9.6* 12.2 9.5* 8.9*  HCT 30.9* 36.0 31.2* 29.8*  MCV 90.1  --  89.9 90.6  PLT 355  --  362 376   Blood Culture    Component Value Date/Time   SDES WOUND RIGHT THIGH 09/15/2016 1734  SPECREQUEST PATIENT ON FOLLOWING ZOSYN NO ANAEROBIC SWAB SENT 09/15/2016 1734   CULT PENDING 09/15/2016 1734   REPTSTATUS PENDING 09/15/2016 1734    Cardiac Enzymes: No results for input(s): CKTOTAL, CKMB, CKMBINDEX, TROPONINI in the last 168 hours. CBG:  Recent Labs Lab 09/15/16 0405 09/15/16 1116 09/16/16 0125 09/16/16 0423 09/16/16 0750  GLUCAP 104* 95 119* 92 71  Medications: . dextrose 5 % and 0.45% NaCl 30 mL/hr at 09/15/16 0216   . calcitonin (salmon)  1 spray Alternating Nares Daily  . cinacalcet  90 mg Oral Q supper  . darbepoetin (ARANESP) injection - DIALYSIS  60 mcg Intravenous Q Fri-HD  . gabapentin  100 mg Oral BID  . heparin subcutaneous  5,000 Units Subcutaneous Q8H  . lanthanum  1,000 mg Oral TID WC  .  LORazepam  1 mg Oral QHS  . multivitamin  1 tablet Oral QHS  . piperacillin-tazobactam (ZOSYN)  IV  3.375 g Intravenous Q12H  . sertraline  100 mg Oral Daily  . vancomycin  500 mg Intravenous Q M,W,F-HD             \

## 2016-09-16 NOTE — Progress Notes (Signed)
[facility]  Subjective  - some soreness right leg   Objective 92/71 93 98.4 F (36.9 C) (Oral) 20 98%  Intake/Output Summary (Last 24 hours) at 09/16/16 0917 Last data filed at 09/16/16 0908  Gross per 24 hour  Intake              790 ml  Output             1550 ml  Net             -760 ml   Right groin incision intact.  No bleeding Drains minimal  Assessment/Planning: Will start to advance drains tomorrow Follow up graft culture Follow up path on biopsy for calciphylaxis Agree with palliative care consult Extent of wounds on right leg is significant and she may require further debridement If this is calciphylaxis she may not survive  Fabienne Bruns 09/16/2016 9:17 AM --  Laboratory Lab Results:  Recent Labs  09/14/16 0539 09/15/16 0353  WBC 16.6* 24.4*  HGB 9.5* 8.9*  HCT 31.2* 29.8*  PLT 362 376   BMET  Recent Labs  09/15/16 0353 09/16/16 0516  NA 137 138  K 5.5* 3.3*  CL 95* 95*  CO2 21* 24  GLUCOSE 106* 81  BUN 66* 37*  CREATININE 7.39* 4.77*  CALCIUM 7.5* 7.8*    COAG Lab Results  Component Value Date   INR 1.35 06/17/2016   INR 1.55 (H) 04/12/2016   INR 1.35 03/31/2016   No results found for: PTT

## 2016-09-16 NOTE — Progress Notes (Signed)
PROGRESS NOTE [facility]   [place] SpringVictoria Small   RUE:[id number] DOB: [birth year]  DOA: 09/13/2016 PCP: Jeanann LewandowskyJEGEDE, OLUGBEMIGA, MD   Brief Narrative:  [name] Small a 25 y.o. 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 Recently hospitalized from 10/22-11/10/17 with T7,T8,T12 vertebral compression fractures and volume overload related to noncompliance with HD. She had a new right femoral AVG placed during admission on 08/31/16. Graft has not been used for HD.  Following her most recent discharge she transferred to [facility] for HD. Her last HD was on 11/20 for 3 hours 27 mins. She has been attending HD since discharge though she did miss HD yesterday  Presents to the ER because of increasing swelling of the right lower extremity with ulcerations and blisters. CT scan was done which was showing diffuse edema. Patient has had recent AV graft placed on right lower extremity and gets her dialysis done on the left lower extremity dialysis catheter. On exam patient has eschar over the recent AV graft placement on the right lower extremity with blisters in the lower extremity both left and right. There is diffuse edema of the right lower extremity.  Subjective: Continues to complain of leg pain mainly in the left leg. S/p graft removal. No other complaints. BP continues to be borderline low    Assessment & Plan: Right lower extremity edema with blisters and ulceration/infected AV graft Right thigh - ? Calciphylaxis s/p graft removal and biopsy  Vascular surgery recommendations appreciated  Poorly healing right thigh av graft with skin necrosis   Culture pending Continue empiric antibiotics Patient with painful  bilateral lower extremity, could be also due sickle cell given patient dehydration Palliative care consult appreciated - pain medications adjusted  Hold on IVF given patient wont have dialysis until Monday   Hypotension - borderline low  Will monitor for now  Carefull with pain meds this could be contributing as well   ESRD on hemodialysis M/W/F - Electrolyte abnormalities  Renal recommendations appreciated Monitor BMP   Nonischemic cardiomyopathy S/P AICD placement - no signs of fluid overload EF 06/2016 =12% Strict I&O Daily weight  T7,T8,T12 vertebral compression fractures Pain management adjusted - continue to monitor BP   Chronic anemia - hemoglobin stable at baseline Most likely from ESRD  DVT prophylaxis: Subcutaneous heparin Code Status: Full Family Communication: None Disposition Plan: Home when medically stable  Consultants:   Vascular surgery Dr. Randie Heinzain  Nephrology Dr. Briant CedarMattingly  Procedures:  11/22 CT bilateral lower extremity:-No evidence of fracture or dislocation. -. Diffuse soft tissue edema involving right hip/entirety of right lower extremity, with large 5.4 cm fluid-filled blister along the anterolateral right lower leg. Multiple smaller blisters along the medial right thigh. Negative abscess - Small bilateral knee joint effusions, Rt>> Lt   Antimicrobials:  Zosyn 11/22  Vancomycin 11/22   Objective: Vitals:   09/15/16 2334 09/16/16 0002 09/16/16 0415 09/16/16 0907  BP: 95/60 111/74 104/70 92/71  Pulse: 98 95 96 93  Resp:   16 20  Temp: 99.3 F (37.4 C)  98.4 F (36.9 C) 98.4 F (36.9 C)  TempSrc:   Oral Oral  SpO2: 99% 98% 100% 98%  Weight:   51 kg (112 lb 6.4 oz)   Height:   5\' 3"  (1.6 m)     Intake/Output Summary (  Last 24 hours) at 09/16/16 1631 Last data filed at 09/16/16 1249  Gross per 24 hour  Intake             1030 ml  Output               50 ml  Net              980 ml   Filed Weights   09/15/16 0700 09/15/16 1033  09/16/16 0415  Weight: 51 kg (112 lb 7 oz) 49.5 kg (109 lb 2 oz) 51 kg (112 lb 6.4 oz)    Examination:  General exam: More comfortable today  Respiratory system: Clear to auscultation.  Cardiovascular system: S1 & S2 heard, RRR. No JVD, murmurs, rubs or gallops Gastrointestinal system: Abdomen is nondistended, soft and nontender. Extremities: Range of movement somewhat better. Right lower extremity with edema up to the thigh. Tenderness to palpation multiple sites in LE Right thigh drainage clean and intact     Skin: Blister and extremities. Fistula in the left arm Psychiatry: Withdrawn and sad, flat affect  Data Reviewed: I have personally reviewed following labs and imaging studies  CBC:  Recent Labs Lab 09/13/16 1805 09/13/16 1945 09/14/16 0539 09/15/16 0353  WBC 13.9*  --  16.6* 24.4*  NEUTROABS 11.2*  --   --  22.2*  HGB 9.6* 12.2 9.5* 8.9*  HCT 30.9* 36.0 31.2* 29.8*  MCV 90.1  --  89.9 90.6  PLT 355  --  362 376   Basic Metabolic Panel:  Recent Labs Lab 09/13/16 1805 09/13/16 1945 09/14/16 0539 09/14/16 1519 09/15/16 0353 09/16/16 0516  NA 140 139 140  --  137 138  K 3.9 3.9 5.1  --  5.5* 3.3*  CL 95* 98* 97*  --  95* 95*  CO2 26  --  27  --  21* 24  GLUCOSE 93 85 123*  --  106* 81  BUN 40* 39* 48*  --  66* 37*  CREATININE 5.80* 5.90* 6.55*  --  7.39* 4.77*  CALCIUM 9.2  --  9.4  --  7.5* 7.8*  MG  --   --   --  2.2  --   --    GFR: Estimated Creatinine Clearance: 14.5 mL/min (by C-G formula based on SCr of 4.77 mg/dL (H)). Liver Function Tests: No results for input(s): AST, ALT, ALKPHOS, BILITOT, PROT, ALBUMIN in the last 168 hours. No results for input(s): LIPASE, AMYLASE in the last 168 hours. No results for input(s): AMMONIA in the last 168 hours. Coagulation Profile: No results for input(s): INR, PROTIME in the last 168 hours. Cardiac Enzymes: No results for input(s): CKTOTAL, CKMB, CKMBINDEX, TROPONINI in the last 168 hours. BNP (last 3  results) No results for input(s): PROBNP in the last 8760 hours. HbA1C: No results for input(s): HGBA1C in the last 72 hours. CBG:  Recent Labs Lab 09/15/16 1116 09/16/16 0125 09/16/16 0423 09/16/16 0750 09/16/16 1218  GLUCAP 95 119* 92 71 113*   Lipid Profile: No results for input(s): CHOL, HDL, LDLCALC, TRIG, CHOLHDL, LDLDIRECT in the last 72 hours. Thyroid Function Tests: No results for input(s): TSH, T4TOTAL, FREET4, T3FREE, THYROIDAB in the last 72 hours. Anemia Panel: No results for input(s): VITAMINB12, FOLATE, FERRITIN, TIBC, IRON, RETICCTPCT in the last 72 hours. Sepsis Labs:  Recent Labs Lab 09/13/16 2130 09/14/16 0018 09/15/16 0353  LATICACIDVEN 2.38* 1.81 1.9    Recent Results (from the past 240 hour(s))  Culture, blood (routine x 2)  Status: None (Preliminary result)   Collection Time: 09/14/16  3:23 PM  Result Value Ref Range Status   Specimen Description BLOOD RIGHT HAND  Final   Special Requests IN PEDIATRIC BOTTLE 2CC  Final   Culture NO GROWTH 2 DAYS  Final   Report Status PENDING  Incomplete  Culture, blood (routine x 2)     Status: None (Preliminary result)   Collection Time: 09/14/16  3:34 PM  Result Value Ref Range Status   Specimen Description BLOOD RIGHT ARM  Final   Special Requests IN PEDIATRIC BOTTLE 2CC  Final   Culture NO GROWTH 2 DAYS  Final   Report Status PENDING  Incomplete  Aerobic/Anaerobic Culture (surgical/deep wound)     Status: None (Preliminary result)   Collection Time: 09/15/16  5:34 PM  Result Value Ref Range Status   Specimen Description WOUND RIGHT THIGH  Final   Special Requests PATIENT ON FOLLOWING ZOSYN NO ANAEROBIC SWAB SENT  Final   Gram Stain NO WBC SEEN NO ORGANISMS SEEN   Final   Culture TOO YOUNG TO READ  Final   Report Status PENDING  Incomplete      Radiology Studies: No results found.  Scheduled Meds: . acetaminophen  500 mg Oral Q8H  . calcitonin (salmon)  1 spray Alternating Nares Daily  .  cinacalcet  90 mg Oral Q supper  . darbepoetin (ARANESP) injection - DIALYSIS  60 mcg Intravenous Q Fri-HD  . feeding supplement (NEPRO CARB STEADY)  237 mL Oral TID WC  . feeding supplement (NEPRO CARB STEADY)  237 mL Oral BID BM  . gabapentin  100 mg Oral BID  . heparin subcutaneous  5,000 Units Subcutaneous Q8H  . lanthanum  1,000 mg Oral TID WC  . LORazepam  1 mg Oral QHS  . methocarbamol  500 mg Oral Q8H  . multivitamin  1 tablet Oral QHS  . piperacillin-tazobactam (ZOSYN)  IV  3.375 g Intravenous Q12H  . sertraline  100 mg Oral Daily  . vancomycin  500 mg Intravenous Q M,W,F-HD   Continuous Infusions: . dextrose 5 % and 0.45% NaCl 30 mL/hr at 09/15/16 0216     LOS: 3 days    Latrelle Dodrill, MD [facility] Pager [phone]  If 7PM-7AM, please contact night-coverage [email or web address] Password [facility] 09/16/2016, 4:31 PM

## 2016-09-17 ENCOUNTER — Encounter (HOSPITAL_COMMUNITY): Payer: Self-pay | Admitting: Vascular Surgery

## 2016-09-17 LAB — BASIC METABOLIC PANEL
ANION GAP: 18 — AB (ref 5–15)
Anion gap: 17 — ABNORMAL HIGH (ref 5–15)
BUN: 51 mg/dL — ABNORMAL HIGH (ref 6–20)
BUN: 62 mg/dL — AB (ref 6–20)
CALCIUM: 7.9 mg/dL — AB (ref 8.9–10.3)
CHLORIDE: 96 mmol/L — AB (ref 101–111)
CHLORIDE: 99 mmol/L — AB (ref 101–111)
CO2: 24 mmol/L (ref 22–32)
CO2: 24 mmol/L (ref 22–32)
CREATININE: 7.09 mg/dL — AB (ref 0.44–1.00)
Calcium: 7.3 mg/dL — ABNORMAL LOW (ref 8.9–10.3)
Creatinine, Ser: 6.22 mg/dL — ABNORMAL HIGH (ref 0.44–1.00)
GFR calc non Af Amer: 9 mL/min — ABNORMAL LOW (ref >60)
GFR calc non Af Amer: 7 mL/min — ABNORMAL LOW (ref >60)
GFR, EST AFRICAN AMERICAN: 10 mL/min — AB (ref >60)
GFR, EST AFRICAN AMERICAN: 8 mL/min — AB (ref >60)
Glucose, Bld: 80 mg/dL (ref 65–99)
Glucose, Bld: 90 mg/dL (ref 65–99)
POTASSIUM: 3.6 mmol/L (ref 3.5–5.1)
Potassium: 3.7 mmol/L (ref 3.5–5.1)
SODIUM: 138 mmol/L (ref 135–145)
Sodium: 140 mmol/L (ref 135–145)

## 2016-09-17 LAB — GLUCOSE, CAPILLARY
GLUCOSE-CAPILLARY: 106 mg/dL — AB (ref 65–99)
GLUCOSE-CAPILLARY: 99 mg/dL (ref 65–99)
Glucose-Capillary: 126 mg/dL — ABNORMAL HIGH (ref 65–99)
Glucose-Capillary: 90 mg/dL (ref 65–99)

## 2016-09-17 LAB — CBC
HEMATOCRIT: 31.3 % — AB (ref 36.0–46.0)
HEMOGLOBIN: 9.3 g/dL — AB (ref 12.0–15.0)
MCH: 27 pg (ref 26.0–34.0)
MCHC: 29.7 g/dL — ABNORMAL LOW (ref 30.0–36.0)
MCV: 90.7 fL (ref 78.0–100.0)
PLATELETS: 364 10*3/uL (ref 150–400)
RBC: 3.45 MIL/uL — AB (ref 3.87–5.11)
RDW: 22.6 % — ABNORMAL HIGH (ref 11.5–15.5)
WBC: 14 10*3/uL — AB (ref 4.0–10.5)

## 2016-09-17 MED ORDER — HYDROMORPHONE HCL 1 MG/ML IJ SOLN
1.0000 mg | Freq: Once | INTRAMUSCULAR | Status: AC
  Administered 2016-09-17: 1 mg via INTRAVENOUS
  Filled 2016-09-17: qty 1

## 2016-09-17 MED ORDER — HYDROMORPHONE HCL 1 MG/ML IJ SOLN: 1.0000 mg | Freq: Once | INTRAMUSCULAR | Status: DC

## 2016-09-17 NOTE — Progress Notes (Signed)
Pt no longer wants her blood sugar checked every 4 hours and asked for an additional dose of dilaudid. MD paged.

## 2016-09-17 NOTE — Progress Notes (Signed)
PROGRESS NOTE [facility]   E[place]   JGG:[id number] DOB: [birth year]  DOA: 09/13/2016 PCP: Jeanann Lewandowsky, MD   Brief Narrative:  [name] Small a 25 y.o. 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 Recently hospitalized from 10/22-11/10/17 with T7,T8,T12 vertebral compression fractures and volume overload related to noncompliance with HD. She had a new right femoral AVG placed during admission on 08/31/16. Graft has not been used for HD.  Following her most recent discharge she transferred to [facility] for HD. Her last HD was on 11/20 for 3 hours 27 mins. She has been attending HD since discharge though she did miss HD yesterday  Presents to the ER because of increasing swelling of the right lower extremity with ulcerations and blisters. CT scan was done which was showing diffuse edema. Patient has had recent AV graft placed on right lower extremity and gets her dialysis done on the left lower extremity dialysis catheter. On exam patient has eschar over the recent AV graft placement on the right lower extremity with blisters in the lower extremity both left and right. There is diffuse edema of the right lower extremity.  Subjective: More comfortable today with current pain medications, vascular surgery advanced drainage today. No new complaints   Assessment & Plan: - No significant changes from yesterday   Right lower extremity edema with blisters and ulceration/infected AV graft Right thigh - ? Calciphylaxis s/p graft removal and biopsy, WBC trending down  Vascular surgery recommendations appreciated  Poorly healing right thigh av graft with skin necrosis   Cultures no growth UTD    Continue IV empiric antibiotics Patient with painful bilateral lower extremity, could be also due sickle cell given patient dehydration - improving with current pain management  Palliative care consult appreciated   Hypotension - borderline low  Will monitor for now  Carefull with pain meds this could be contributing as well   ESRD on hemodialysis M/W/F - Electrolyte abnormalities  Renal recommendations appreciated Monitor BMP   Nonischemic cardiomyopathy S/P AICD placement - no signs of fluid overload EF 06/2016 =12% Strict I&O Daily weight  T7,T8,T12 vertebral compression fractures Pain management adjusted - continue to monitor BP   Chronic anemia - hemoglobin stable at baseline Most likely from ESRD  DVT prophylaxis: Subcutaneous heparin Code Status: Full Family Communication: None Disposition Plan: Home when medically stable  Consultants:   Vascular surgery Dr. Randie Heinz  Nephrology Dr. Briant Cedar  Procedures:  11/22 CT bilateral lower extremity:-No evidence of fracture or dislocation. -. Diffuse soft tissue edema involving right hip/entirety of right lower extremity, with large 5.4 cm fluid-filled blister along the anterolateral right lower leg. Multiple smaller blisters along the medial right thigh. Negative abscess - Small bilateral knee joint effusions, Rt>> Lt   Antimicrobials:  Zosyn 11/22  Vancomycin 11/22   Objective: Vitals:   09/16/16 1717 09/16/16 2024 09/17/16 0605 09/17/16 0851  BP: (!) 100/59 113/82 108/71 108/65  Pulse: 89 89 84 88  Resp: 20 17 16 18   Temp: 98.2 F (36.8 C) 98.2 F (36.8 C) 97.4 F (36.3 C) 98.4 F (36.9 C)  TempSrc: Oral Oral Oral Oral  SpO2: 98% 97% 100% 100%  Weight:  52.3 kg (115 lb 4.8 oz)    Height:        Intake/Output Summary (  Last 24 hours) at 09/17/16 1733 Last data filed at 09/17/16 1321  Gross per 24 hour  Intake              780 ml  Output                1 ml  Net              779 ml   Filed Weights    09/15/16 1033 09/16/16 0415 09/16/16 2024  Weight: 49.5 kg (109 lb 2 oz) 51 kg (112 lb 6.4 oz) 52.3 kg (115 lb 4.8 oz)    Examination:  General exam: Lying in bed comfortable  Respiratory system: Clear to auscultation.  Cardiovascular system: S1 & S2 heard, RRR.  Extremities: Range of movement continues too improve. Right lower extremity with edema decreasing.  Penrose on R thigh    Skin: Blister and extremities. Fistula in the left arm Psychiatry: Withdrawn and sad, flat affect  Data Reviewed: I have personally reviewed following labs and imaging studies  CBC:  Recent Labs Lab 09/13/16 1805 09/13/16 1945 09/14/16 0539 09/15/16 0353 09/17/16 0738  WBC 13.9*  --  16.6* 24.4* 14.0*  NEUTROABS 11.2*  --   --  22.2*  --   HGB 9.6* 12.2 9.5* 8.9* 9.3*  HCT 30.9* 36.0 31.2* 29.8* 31.3*  MCV 90.1  --  89.9 90.6 90.7  PLT 355  --  362 376 364   Basic Metabolic Panel:  Recent Labs Lab 09/13/16 1805 09/13/16 1945 09/14/16 0539 09/14/16 1519 09/15/16 0353 09/16/16 0516 09/17/16 0738  NA 140 139 140  --  137 138 138  K 3.9 3.9 5.1  --  5.5* 3.3* 3.6  CL 95* 98* 97*  --  95* 95* 96*  CO2 26  --  27  --  21* 24 24  GLUCOSE 93 85 123*  --  106* 81 80  BUN 40* 39* 48*  --  66* 37* 51*  CREATININE 5.80* 5.90* 6.55*  --  7.39* 4.77* 6.22*  CALCIUM 9.2  --  9.4  --  7.5* 7.8* 7.3*  MG  --   --   --  2.2  --   --   --    GFR: Estimated Creatinine Clearance: 11.4 mL/min (by C-G formula based on SCr of 6.22 mg/dL (H)). Liver Function Tests: No results for input(s): AST, ALT, ALKPHOS, BILITOT, PROT, ALBUMIN in the last 168 hours. No results for input(s): LIPASE, AMYLASE in the last 168 hours. No results for input(s): AMMONIA in the last 168 hours. Coagulation Profile: No results for input(s): INR, PROTIME in the last 168 hours. Cardiac Enzymes: No results for input(s): CKTOTAL, CKMB, CKMBINDEX, TROPONINI in the last 168 hours. BNP (last 3 results) No results for input(s):  PROBNP in the last 8760 hours. HbA1C: No results for input(s): HGBA1C in the last 72 hours. CBG:  Recent Labs Lab 09/16/16 1959 09/17/16 0000 09/17/16 0801 09/17/16 1207 09/17/16 1649  GLUCAP 75 87 126* 106* 99   Lipid Profile: No results for input(s): CHOL, HDL, LDLCALC, TRIG, CHOLHDL, LDLDIRECT in the last 72 hours. Thyroid Function Tests: No results for input(s): TSH, T4TOTAL, FREET4, T3FREE, THYROIDAB in the last 72 hours. Anemia Panel: No results for input(s): VITAMINB12, FOLATE, FERRITIN, TIBC, IRON, RETICCTPCT in the last 72 hours. Sepsis Labs:  Recent Labs Lab 09/13/16 2130 09/14/16 0018 09/15/16 0353  LATICACIDVEN 2.38* 1.81 1.9    Recent Results (from the past 240 hour(s))  Culture, blood (routine  x 2)     Status: None (Preliminary result)   Collection Time: 09/14/16  3:23 PM  Result Value Ref Range Status   Specimen Description BLOOD RIGHT HAND  Final   Special Requests IN PEDIATRIC BOTTLE 2CC  Final   Culture NO GROWTH 3 DAYS  Final   Report Status PENDING  Incomplete  Culture, blood (routine x 2)     Status: None (Preliminary result)   Collection Time: 09/14/16  3:34 PM  Result Value Ref Range Status   Specimen Description BLOOD RIGHT ARM  Final   Special Requests IN PEDIATRIC BOTTLE 2CC  Final   Culture NO GROWTH 3 DAYS  Final   Report Status PENDING  Incomplete  Aerobic/Anaerobic Culture (surgical/deep wound)     Status: None (Preliminary result)   Collection Time: 09/15/16  5:34 PM  Result Value Ref Range Status   Specimen Description WOUND RIGHT THIGH  Final   Special Requests PATIENT ON FOLLOWING ZOSYN NO ANAEROBIC SWAB SENT  Final   Gram Stain NO WBC SEEN NO ORGANISMS SEEN   Final   Culture RARE STAPHYLOCOCCUS SPECIES (COAGULASE NEGATIVE)  Final   Report Status PENDING  Incomplete      Radiology Studies: No results found.  Scheduled Meds: . acetaminophen  500 mg Oral Q8H  . calcitonin (salmon)  1 spray Alternating Nares Daily  .  cinacalcet  90 mg Oral Q supper  . darbepoetin (ARANESP) injection - DIALYSIS  60 mcg Intravenous Q Fri-HD  . feeding supplement (NEPRO CARB STEADY)  237 mL Oral TID WC  . feeding supplement (NEPRO CARB STEADY)  237 mL Oral BID BM  . gabapentin  100 mg Oral BID  . heparin subcutaneous  5,000 Units Subcutaneous Q8H  . lanthanum  1,000 mg Oral TID WC  . LORazepam  1 mg Oral QHS  . methocarbamol  500 mg Oral Q8H  . multivitamin  1 tablet Oral QHS  . piperacillin-tazobactam (ZOSYN)  IV  3.375 g Intravenous Q12H  . sertraline  100 mg Oral Daily  . vancomycin  500 mg Intravenous Q M,W,F-HD   Continuous Infusions: . dextrose 5 % and 0.45% NaCl 20 mL/hr at 09/17/16 0855     LOS: 4 days    Latrelle DodrillEdwin Silva, MD [facility] Pager [phone]  If 7PM-7AM, please contact night-coverage [email or web address] Password [email or web address] 09/17/2016, 5:33 PM

## 2016-09-17 NOTE — Progress Notes (Signed)
Pharmacy Antibiotic Note  [name] Small is a 25 y.o. female admitted on 09/13/2016 with cellulitis.  Pharmacy has been consulted for Vancomycin/Zosyn dosing.   Day #4 of empiric abx for right lower extremity cellulitis with blisters/eschar over site of recent AV graft placement. Diffuse edema. S/p graft removal on 11/24. Afebrile, WBC down to 14.   Plan: Continue vancomycin IV QHD-MWF Continue Zosyn 3.375g IV Q12 to be infused over 4 hours Monitor clinical picture, renal function, pre-HD VR prn F/U C&S, abx deescalation / LOT   Height: 5\' 3"  (160 cm) Weight: 115 lb 4.8 oz (52.3 kg) IBW/kg (Calculated) : 52.4  Temp (24hrs), Avg:98.1 F (36.7 C), Min:97.4 F (36.3 C), Max:98.4 F (36.9 C)   Recent Labs Lab 09/13/16 1805 09/13/16 1945 09/13/16 2130 09/14/16 0018 09/14/16 0539 09/15/16 0353 09/16/16 0516 09/17/16 0738  WBC 13.9*  --   --   --  16.6* 24.4*  --  14.0*  CREATININE 5.80* 5.90*  --   --  6.55* 7.39* 4.77* 6.22*  LATICACIDVEN  --   --  2.38* 1.81  --  1.9  --   --     Estimated Creatinine Clearance: 11.4 mL/min (by C-G formula based on SCr of 6.22 mg/dL (H)).    Allergies  Allergen Reactions  . Aspirin Other (See Comments)    Interacts with Coreg  . Tramadol Anaphylaxis  . Vicodin [Hydrocodone-Acetaminophen] Hives  . Buprenorphine Hcl Itching    Ok with oxycodone  . Iohexol Itching  . Morphine And Related Itching and Other (See Comments)    Ok with oxycodone    Antimicrobials this admission:  Vancomycin 11/23 >> Zosyn 11/23 >>   Dose adjustments this admission:  n/a  Microbiology results:  11/23 BCx x 2: ngtd 11/24 Wound Rt thigh: pending  Enzo Bi, PharmD, BCPS Clinical Pharmacist Pager [phone] 09/17/2016 1:16 PM

## 2016-09-17 NOTE — Consult Note (Signed)
[facility]  Subjective  - slightly less pain   Objective 108/65 88 98.4 F (36.9 C) (Oral) 18 100%  Intake/Output Summary (Last 24 hours) at 09/17/16 0858 Last data filed at 09/17/16 0700  Gross per 24 hour  Intake              960 ml  Output                1 ml  Net              959 ml   Right lateral hip and medial thigh with full thickness skin slough probably full thickness to fascia level Groin incision healing Penrose drains each pulled back slightly Right leg edema slightly improved  Assessment/Planning: Continue to advance drains daily Healing will be a lengthy process at least 6 weeks if not more.  Continue xeroform to necrotic areas on thigh Follow up on pathology for calciphylaxis tomorrow Continue to follow up on graft cultures.  WBC trending down reassuring  Fabienne Bruns 09/17/2016 8:58 AM --  Laboratory Lab Results:  Recent Labs  09/15/16 0353 09/17/16 0738  WBC 24.4* 14.0*  HGB 8.9* 9.3*  HCT 29.8* 31.3*  PLT 376 364   BMET  Recent Labs  09/16/16 0516 09/17/16 0738  NA 138 138  K 3.3* 3.6  CL 95* 96*  CO2 24 24  GLUCOSE 81 80  BUN 37* 51*  CREATININE 4.77* 6.22*  CALCIUM 7.8* 7.3*    COAG Lab Results  Component Value Date   INR 1.35 06/17/2016   INR 1.55 (H) 04/12/2016   INR 1.35 03/31/2016   No results found for: PTT

## 2016-09-17 NOTE — Progress Notes (Signed)
[facility] Progress Note   Dialysis Orders:  MWF 4h Opiflux 180 BFR 400/800 2K/2Ca EDW 49.5kg left fem TDC Heparin 2000 U IV Bolus q HD  Hectorol 1mcg IV q HD  Aranesp 60 mcg IV q week (not given yet)  Assessment/Plan: 1. Right LE edema/ulceration - s/p removal of right thigh graft 11/24 by Dr. 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 Cellulitis - on empiric Vanc and Zosyn. Cultures pending and path on tissue pending 2. ESRD -MWF -K 3.6 next HD Monday needs standing wts K 3.3 today post HD yest 3. Anemia - hgb 9.3 -Aranesp 60 given 11/24 4. 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 hypotension/volume - net UF 1.5 11/24; post wt 51 - nonstanding 6. Nutrition - alb 3.2 11/11- suspect it is lower now, add supplements 7. Symptom management - Palliative care has seen with recommendations for care outlined.  8. NICM s/p AICD - EF 20-25% 06/19/2016  Sheffield SliderMartha B Bergman, PA-C [facility] Beeper [phone] 09/17/2016,8:55 AM  LOS: 4 days  I have seen and examined this patient and agree with plan per Bard HerbertMarty Bergman.  If Bx suggest calciphylaxis then may need to consider Sodium thiosulfate. HD tomorrow. Johathon Overturf T,MD 09/17/2016 9:36 AM Subjective:   Using walker prior to admission; lives alone in an apartment - takes van to dialysis  Objective Vitals:   09/16/16 1717 09/16/16 2024 09/17/16 0605 09/17/16 0851  BP: (!) 100/59 113/82 108/71 108/65  Pulse: 89 89 84 88  Resp: 20 17 16 18   Temp: 98.2 F (36.8 C) 98.2 F (36.8 C) 97.4 F (36.3 C) 98.4 F (36.9 C)  TempSrc: Oral Oral Oral Oral  SpO2: 98% 97% 100% 100%  Weight:  52.3 kg (115 lb 4.8 oz)    Height:       Physical Exam General: NAD at rest; very painful to move legs Heart: RRR Lungs: no  rales Abdomen: soft NT Extremities: right leg + generalized edema; 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; dressing over right thigh where graft was removed Dialysis Access:  Left fem cath   Additional Objective Labs: Basic Metabolic Panel:  Recent Labs Lab 09/15/16 0353 09/16/16 0516 09/17/16 0738  NA 137 138 138  K 5.5* 3.3* 3.6  CL 95* 95* 96*  CO2 21* 24 24  GLUCOSE 106* 81 80  BUN 66* 37* 51*  CREATININE 7.39* 4.77* 6.22*  CALCIUM 7.5* 7.8* 7.3*  CBC:  Recent Labs Lab 09/13/16 1805  09/14/16 0539 09/15/16 0353 09/17/16 0738  WBC 13.9*  --  16.6* 24.4* 14.0*  NEUTROABS 11.2*  --   --  22.2*  --   HGB 9.6*  < > 9.5* 8.9* 9.3*  HCT 30.9*  < > 31.2* 29.8* 31.3*  MCV 90.1  --  89.9 90.6 90.7  PLT 355  --  362 376 364  < > = values in this interval not displayed. Blood Culture    Component Value Date/Time   SDES WOUND RIGHT THIGH 09/15/2016 1734   SPECREQUEST PATIENT ON FOLLOWING ZOSYN NO ANAEROBIC SWAB SENT 09/15/2016 1734   CULT TOO YOUNG TO READ 09/15/2016 1734   REPTSTATUS PENDING 09/15/2016 1734    Cardiac Enzymes: No results for input(s): CKTOTAL, CKMB, CKMBINDEX, TROPONINI in the last 168 hours. CBG:  Recent Labs Lab 09/16/16 1218 09/16/16 1628 09/16/16 1959 09/17/16 0000  09/17/16 0801  GLUCAP 113* 103* 75 87 126*   IrMedications: . dextrose 5 % and 0.45% NaCl 30 mL/hr at 09/15/16 0216   . acetaminophen  500 mg Oral Q8H  . calcitonin (salmon)  1 spray Alternating Nares Daily  . cinacalcet  90 mg Oral Q supper  . darbepoetin (ARANESP) injection - DIALYSIS  60 mcg Intravenous Q Fri-HD  . feeding supplement (NEPRO CARB STEADY)  237 mL Oral TID WC  . feeding supplement (NEPRO CARB STEADY)  237 mL Oral BID BM  . gabapentin  100 mg Oral BID  . heparin subcutaneous  5,000 Units Subcutaneous Q8H  . lanthanum  1,000 mg Oral TID WC  . LORazepam  1 mg Oral QHS  . methocarbamol  500 mg Oral Q8H  . multivitamin  1 tablet Oral QHS  .  piperacillin-tazobactam (ZOSYN)  IV  3.375 g Intravenous Q12H  . sertraline  100 mg Oral Daily  . vancomycin  500 mg Intravenous Q M,W,F-HD

## 2016-09-18 ENCOUNTER — Encounter (HOSPITAL_COMMUNITY): Payer: Self-pay | Admitting: Family Medicine

## 2016-09-18 LAB — CBC WITH DIFFERENTIAL/PLATELET
Basophils Absolute: 0 10*3/uL (ref 0.0–0.1)
Basophils Relative: 0 %
EOS PCT: 2 %
Eosinophils Absolute: 0.3 10*3/uL (ref 0.0–0.7)
HEMATOCRIT: 30.7 % — AB (ref 36.0–46.0)
Hemoglobin: 9.3 g/dL — ABNORMAL LOW (ref 12.0–15.0)
LYMPHS ABS: 1.6 10*3/uL (ref 0.7–4.0)
Lymphocytes Relative: 12 %
MCH: 27.2 pg (ref 26.0–34.0)
MCHC: 30.3 g/dL (ref 30.0–36.0)
MCV: 89.8 fL (ref 78.0–100.0)
MONOS PCT: 7 %
Monocytes Absolute: 1 10*3/uL (ref 0.1–1.0)
NEUTROS ABS: 10.8 10*3/uL — AB (ref 1.7–7.7)
Neutrophils Relative %: 79 %
Platelets: 380 10*3/uL (ref 150–400)
RBC: 3.42 MIL/uL — AB (ref 3.87–5.11)
RDW: 23 % — AB (ref 11.5–15.5)
WBC: 13.7 10*3/uL — AB (ref 4.0–10.5)

## 2016-09-18 LAB — BASIC METABOLIC PANEL
Anion gap: 16 — ABNORMAL HIGH (ref 5–15)
BUN: 69 mg/dL — AB (ref 6–20)
CHLORIDE: 99 mmol/L — AB (ref 101–111)
CO2: 27 mmol/L (ref 22–32)
Calcium: 8 mg/dL — ABNORMAL LOW (ref 8.9–10.3)
Creatinine, Ser: 7.45 mg/dL — ABNORMAL HIGH (ref 0.44–1.00)
GFR calc Af Amer: 8 mL/min — ABNORMAL LOW (ref >60)
GFR calc non Af Amer: 7 mL/min — ABNORMAL LOW (ref >60)
GLUCOSE: 79 mg/dL (ref 65–99)
POTASSIUM: 3.5 mmol/L (ref 3.5–5.1)
Sodium: 142 mmol/L (ref 135–145)

## 2016-09-18 MED ORDER — HYDROMORPHONE HCL 1 MG/ML IJ SOLN
0.5000 mg | Freq: Once | INTRAMUSCULAR | Status: AC
  Administered 2016-09-18: 0.5 mg via INTRAVENOUS
  Filled 2016-09-18: qty 1

## 2016-09-18 MED ORDER — SODIUM THIOSULFATE 25 % IV SOLN
25.0000 g | INTRAVENOUS | Status: DC
  Administered 2016-09-19 – 2016-09-25 (×3): 25 g via INTRAVENOUS
  Filled 2016-09-18 (×7): qty 100

## 2016-09-18 MED ORDER — ENOXAPARIN SODIUM 30 MG/0.3ML ~~LOC~~ SOLN
30.0000 mg | SUBCUTANEOUS | Status: DC
  Administered 2016-09-19 – 2016-09-25 (×5): 30 mg via SUBCUTANEOUS
  Filled 2016-09-18 (×8): qty 0.3

## 2016-09-18 MED ORDER — HYDROMORPHONE HCL 1 MG/ML IJ SOLN
1.0000 mg | INTRAMUSCULAR | Status: DC | PRN
  Administered 2016-09-18 – 2016-09-19 (×4): 2 mg via INTRAVENOUS
  Administered 2016-09-19: 1 mg via INTRAVENOUS
  Administered 2016-09-19: 2 mg via INTRAVENOUS
  Filled 2016-09-18: qty 2
  Filled 2016-09-18: qty 1
  Filled 2016-09-18 (×2): qty 2
  Filled 2016-09-18: qty 1
  Filled 2016-09-18: qty 2

## 2016-09-18 NOTE — Care Management Important Message (Signed)
Important Message  Patient Details  Name: [name] Small MRN: [id number] Date of Birth: [birth year]   Medicare Important Message Given:  Yes    [name] 09/18/2016, 12:08 PM

## 2016-09-18 NOTE — Consult Note (Addendum)
WOC Nurse wound consult note Reason for Consult: Consult requested for right groin and lower leg wounds by the nephrology team.  Pt is being followed by the Vascular team for right groin and right upper thigh wounds and went to the OR on 11/24 for debridement, according to the EMR.  Their team is following for assessment and plan of care and has ordered xeroform gauze and penrose drains to right upper thigh.  There is eschar surrounding the full thickness postop wounds, which appears to be increasing in size and is very painful. Approx 30X30cm. Right groin has a surgical incision which is open to air and is approximated; it appears to have some type of medical adhesive for closure, no open wound, fluctuance, or drainage. Bilat calves with several patchy areas of either partial thickness blisters which have ruptured, or black eschar areas which are dry and adhered.  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 Dressing procedure/placement/frequency: Topical treatment is minimally effective to promote healing if wounds are determined to be calciphylaxis.  Goals are directed towards minimizing further injury and decreasing pain.  Consider ordering topical lidocaine to decrease pain to these locations.  Foam dressings to protect wounds to lower legs.  Refer to vascular team for further plan of care to right groin and right thigh wounds. Please re-consult if further assistance is needed.  Thank-you,  Cammie Mcgee MSN, RN, CWOCN, [place], CNS [phone]

## 2016-09-18 NOTE — Progress Notes (Signed)
PROGRESS NOTE [facility]   [place]   GNF:[id number] DOB: 03-01-1991  DOA: 09/13/2016 PCP: Jeanann Lewandowsky, MD   Brief Narrative:  [name] Small a 25 y.o. 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 Recently hospitalized from 10/22-11/10/17 with T7,T8,T12 vertebral compression fractures and volume overload related to noncompliance with HD. She had a new right femoral AVG placed during admission on 08/31/16. Graft has not been used for HD.  Following her most recent discharge she transferred to [facility] for HD. Her last HD was on 11/20 for 3 hours 27 mins. She has been attending HD since discharge though she did miss HD yesterday  Presents to the ER because of increasing swelling of the right lower extremity with ulcerations and blisters. CT scan was done which was showing diffuse edema. Patient has had recent AV graft placed on right lower extremity and gets her dialysis done on the left lower extremity dialysis catheter. On exam patient has eschar over the recent AV graft placement on the right lower extremity with blisters in the lower extremity both left and right. There is diffuse edema of the right lower extremity.  Subjective: Pt complaining of pain after wound care earlier this morning.    Assessment & Plan: - No significant changes from yesterday   Right lower extremity edema with blisters and ulceration/infected AV graft Right thigh - ?Calciphylaxis s/p graft removal and biopsy, WBC trending down  Vascular surgery recommendations appreciated  Poorly healing right thigh av graft with skin necrosis   Cultures no growth UTD  Continue IV empiric antibiotics per surgery Patient  with painful bilateral lower extremity, could be also due sickle cell given patient dehydration - improving with current pain management  Palliative care consult appreciated   Hypotension - borderline low  Will monitor for now  Careful with pain meds   ESRD on hemodialysis M/W/F - Electrolyte abnormalities  Renal recommendations appreciated Monitor BMP  HD today   Nonischemic cardiomyopathy S/P AICD placement - no signs of fluid overload EF 06/2016 =12% Strict I&O Daily weights  T7,T8,T12 vertebral compression fractures Pain management adjusted - continue to monitor BP   Chronic anemia - hemoglobin stable at baseline Most likely from ESRD  DVT prophylaxis: Pt refusing all heparin injections: switched to lovenox 11/27 Code Status: Full Family Communication: Pt Disposition Plan: Home when medically stable  Consultants:   Vascular surgery Dr. Randie Heinz  Nephrology Dr. Briant Cedar  Procedures:  11/22 CT bilateral lower extremity:-No evidence of fracture or dislocation. -. Diffuse soft tissue edema involving right hip/entirety of right lower extremity, with large 5.4 cm fluid-filled blister along the anterolateral right lower leg. Multiple smaller blisters along the medial right thigh. Negative abscess - Small bilateral knee joint effusions, Rt>> Lt   Antimicrobials:  Zosyn 11/22  Vancomycin 11/22  Objective: Vitals:   09/17/16 0851 09/17/16 1735 09/17/16 2028 09/18/16 0521  BP: 108/65 111/70 114/70 102/69  Pulse: 88 92 93 91  Resp: 18 18 17 16   Temp: 98.4 F (36.9 C) 98.7 F (37.1 C) 98.6 F (37 C) 98.8 F (37.1 C)  TempSrc: Oral Oral Oral Oral  SpO2: 100% 100% 100% 99%  Weight:   56.4 kg (124 lb 6.4 oz)   Height:        Intake/Output Summary (Last 24  hours) at 09/18/16 0830 Last data filed at 09/18/16 0600  Gross per 24 hour  Intake              900 ml  Output                1 ml  Net              899 ml   Filed Weights   09/16/16 0415 09/16/16 2024  09/17/16 2028  Weight: 51 kg (112 lb 6.4 oz) 52.3 kg (115 lb 4.8 oz) 56.4 kg (124 lb 6.4 oz)   Examination:  General exam: Lying in bed appears to be comfortable  Respiratory system: Clear to auscultation.  Cardiovascular system: S1 & S2 heard, RRR.  Extremities: Range of movement continues to improve. Right lower extremity with edema decreasing. Penrose on R thigh    Skin: Blister and extremities. Fistula in the left arm Psychiatry: Withdrawn and flat affect  Data Reviewed: I have personally reviewed following labs and imaging studies  CBC:  Recent Labs Lab 09/13/16 1805 09/13/16 1945 09/14/16 0539 09/15/16 0353 09/17/16 0738 09/18/16 0636  WBC 13.9*  --  16.6* 24.4* 14.0* 13.7*  NEUTROABS 11.2*  --   --  22.2*  --  PENDING  HGB 9.6* 12.2 9.5* 8.9* 9.3* 9.3*  HCT 30.9* 36.0 31.2* 29.8* 31.3* 30.7*  MCV 90.1  --  89.9 90.6 90.7 89.8  PLT 355  --  362 376 364 380   Basic Metabolic Panel:  Recent Labs Lab 09/14/16 1519 09/15/16 0353 09/16/16 0516 09/17/16 0738 09/17/16 2309 09/18/16 0636  NA  --  137 138 138 140 142  K  --  5.5* 3.3* 3.6 3.7 3.5  CL  --  95* 95* 96* 99* 99*  CO2  --  21* 24 24 24 27   GLUCOSE  --  106* 81 80 90 79  BUN  --  66* 37* 51* 62* 69*  CREATININE  --  7.39* 4.77* 6.22* 7.09* 7.45*  CALCIUM  --  7.5* 7.8* 7.3* 7.9* 8.0*  MG 2.2  --   --   --   --   --    GFR: Estimated Creatinine Clearance: 9.5 mL/min (by C-G formula based on SCr of 7.45 mg/dL (H)). Liver Function Tests: No results for input(s): AST, ALT, ALKPHOS, BILITOT, PROT, ALBUMIN in the last 168 hours. No results for input(s): LIPASE, AMYLASE in the last 168 hours. No results for input(s): AMMONIA in the last 168 hours. Coagulation Profile: No results for input(s): INR, PROTIME in the last 168 hours. Cardiac Enzymes: No results for input(s): CKTOTAL, CKMB, CKMBINDEX, TROPONINI in the last 168 hours. BNP (last 3 results) No results for input(s): PROBNP in the last 8760  hours. HbA1C: No results for input(s): HGBA1C in the last 72 hours. CBG:  Recent Labs Lab 09/17/16 0000 09/17/16 0801 09/17/16 1207 09/17/16 1649 09/17/16 2030  GLUCAP 87 126* 106* 99 90   Lipid Profile: No results for input(s): CHOL, HDL, LDLCALC, TRIG, CHOLHDL, LDLDIRECT in the last 72 hours. Thyroid Function Tests: No results for input(s): TSH, T4TOTAL, FREET4, T3FREE, THYROIDAB in the last 72 hours. Anemia Panel: No results for input(s): VITAMINB12, FOLATE, FERRITIN, TIBC, IRON, RETICCTPCT in the last 72 hours. Sepsis Labs:  Recent Labs Lab 09/13/16 2130 09/14/16 0018 09/15/16 0353  LATICACIDVEN 2.38* 1.81 1.9    Recent Results (from the past 240 hour(s))  Culture, blood (routine x 2)     Status: None (Preliminary result)  Collection Time: 09/14/16  3:23 PM  Result Value Ref Range Status   Specimen Description BLOOD RIGHT HAND  Final   Special Requests IN PEDIATRIC BOTTLE 2CC  Final   Culture NO GROWTH 3 DAYS  Final   Report Status PENDING  Incomplete  Culture, blood (routine x 2)     Status: None (Preliminary result)   Collection Time: 09/14/16  3:34 PM  Result Value Ref Range Status   Specimen Description BLOOD RIGHT ARM  Final   Special Requests IN PEDIATRIC BOTTLE 2CC  Final   Culture NO GROWTH 3 DAYS  Final   Report Status PENDING  Incomplete  Aerobic/Anaerobic Culture (surgical/deep wound)     Status: None (Preliminary result)   Collection Time: 09/15/16  5:34 PM  Result Value Ref Range Status   Specimen Description WOUND RIGHT THIGH  Final   Special Requests PATIENT ON FOLLOWING ZOSYN NO ANAEROBIC SWAB SENT  Final   Gram Stain NO WBC SEEN NO ORGANISMS SEEN   Final   Culture RARE STAPHYLOCOCCUS SPECIES (COAGULASE NEGATIVE)  Final   Report Status PENDING  Incomplete    Radiology Studies: No results found.  Scheduled Meds: . acetaminophen  500 mg Oral Q8H  . calcitonin (salmon)  1 spray Alternating Nares Daily  . cinacalcet  90 mg Oral Q supper   . darbepoetin (ARANESP) injection - DIALYSIS  60 mcg Intravenous Q Fri-HD  . feeding supplement (NEPRO CARB STEADY)  237 mL Oral TID WC  . feeding supplement (NEPRO CARB STEADY)  237 mL Oral BID BM  . gabapentin  100 mg Oral BID  . heparin subcutaneous  5,000 Units Subcutaneous Q8H  .  HYDROmorphone (DILAUDID) injection  0.5 mg Intravenous Once  . lanthanum  1,000 mg Oral TID WC  . LORazepam  1 mg Oral QHS  . methocarbamol  500 mg Oral Q8H  . multivitamin  1 tablet Oral QHS  . piperacillin-tazobactam (ZOSYN)  IV  3.375 g Intravenous Q12H  . sertraline  100 mg Oral Daily  . vancomycin  500 mg Intravenous Q M,W,F-HD   Continuous Infusions: . dextrose 5 % and 0.45% NaCl 20 mL/hr at 09/17/16 0855     LOS: 5 days   Standley Dakins, MD [facility] Pager [phone]  If 7PM-7AM, please contact night-coverage [email or web address] Password [email or web address] 09/18/2016, 8:30 AM

## 2016-09-18 NOTE — Progress Notes (Signed)
  Progress Note    09/18/2016 3:46 PM 3 Days Post-Op  Subjective:   Has bilateral leg pain  Vitals:   09/18/16 0521 09/18/16 0953  BP: 102/69 124/89  Pulse: 91 89  Resp: 16 16  Temp: 98.8 F (37.1 C) 98 F (36.7 C)    Physical Exam: Right lateral hip and medial thigh with full thickness skin  Groin incision healing with penrose drains Right leg edema improved from my last exam  CBC    Component Value Date/Time   WBC 13.7 (H) 09/18/2016 0636   RBC 3.42 (L) 09/18/2016 0636   HGB 9.3 (L) 09/18/2016 0636   HCT 30.7 (L) 09/18/2016 0636   PLT 380 09/18/2016 0636   MCV 89.8 09/18/2016 0636   MCH 27.2 09/18/2016 0636   MCHC 30.3 09/18/2016 0636   RDW 23.0 (H) 09/18/2016 0636   LYMPHSABS 1.6 09/18/2016 0636   MONOABS 1.0 09/18/2016 0636   EOSABS 0.3 09/18/2016 0636   BASOSABS 0.0 09/18/2016 0636    BMET    Component Value Date/Time   NA 142 09/18/2016 0636   K 3.5 09/18/2016 0636   CL 99 (L) 09/18/2016 0636   CO2 27 09/18/2016 0636   GLUCOSE 79 09/18/2016 0636   BUN 69 (H) 09/18/2016 0636   CREATININE 7.45 (H) 09/18/2016 0636   CALCIUM 8.0 (L) 09/18/2016 0636   GFRNONAA 7 (L) 09/18/2016 0636   GFRAA 8 (L) 09/18/2016 0636    INR    Component Value Date/Time   INR 1.35 06/17/2016 0229     Intake/Output Summary (Last 24 hours) at 09/18/16 1546 Last data filed at 09/18/16 0900  Gross per 24 hour  Intake              840 ml  Output                1 ml  Net              839 ml     Assessment:  25 y.o. female is avg excision r thigh, path is necrotic skin, leukocytosis improving  Plan: Continue abx Will continue to withdraw penrose drains until out Xeroform gauze to skin   Jenesis Martin C. Randie Heinz, MD [facility] Office: [phone] Pager: [phone]  09/18/2016 3:46 PM

## 2016-09-18 NOTE — Anesthesia Postprocedure Evaluation (Signed)
Anesthesia Post Note  Patient: [name] Small  Procedure(s) Performed: Procedure(s) (LRB): REMOVAL OF RIGHT THIGH GRAFT (Right)  Patient location during evaluation: PACU Anesthesia Type: General Level of consciousness: awake, awake and alert and oriented Pain management: pain level controlled Vital Signs Assessment: post-procedure vital signs reviewed and stable Respiratory status: spontaneous breathing, nonlabored ventilation and respiratory function stable Cardiovascular status: blood pressure returned to baseline Anesthetic complications: no    Last Vitals:  Vitals:   09/18/16 0521 09/18/16 0953  BP: 102/69 124/89  Pulse: 91 89  Resp: 16 16  Temp: 37.1 C 36.7 C    Last Pain:  Vitals:   09/18/16 1557  TempSrc:   PainSc: 10-Worst pain ever                 Rayah Fines COKER

## 2016-09-18 NOTE — Progress Notes (Addendum)
Subjective:  Co R Leg pain /  Objective Vital signs in last 24 hours: Vitals:   09/17/16 0851 09/17/16 1735 09/17/16 2028 09/18/16 0521  BP: 108/65 111/70 114/70 102/69  Pulse: 88 92 93 91  Resp: 18 18 17 16   Temp: 98.4 F (36.9 C) 98.7 F (37.1 C) 98.6 F (37 C) 98.8 F (37.1 C)  TempSrc: Oral Oral Oral Oral  SpO2: 100% 100% 100% 99%  Weight:   56.4 kg (124 lb 6.4 oz)   Height:       Weight change: 4.127 kg (9 lb 1.6 oz)   Physical Exam  General:  Alert ,OX3 NAD but cos painful R Leg Heart: RRR, no rub , 1/6 sem  Lungs:  CTA /no rales Abdomen: soft NT/ ND  Extremities: right leg trace generalized edema;None Left/  areas lower Right Leg dressing intact and dressing over right thigh where graft was removed Dialysis Access:  Left fem perm  cath   Dialysis Orders: MWF 4h Opiflux 180 BFR 400/800 2K/2Ca EDW 49.5kg left fem TDC Heparin 2000 U IV Bolus q HD  Hectorol IV q HD  Aranesp 60 mcg IV q week (not given yet)  Problem/Plan: 1. Right LE edema/ulceration -s/p removal of right thigh graft 11/24 by Dr. 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 Cellulitis - on empiric Vanc and Zosyn.  blood and tissue Cultures no growth so far  and path on tissue pending 2. ESRD-MWF -K 3.5next HD  Today 3. Anemia- hgb 9.3 -Aranesp 60 given 11/24 4. Leg wounds/ calciphylaxis - severely painful gangrenous skin changes bilat calves, clinical picture c/w calciphylaxis. Will start Na thio tiw, hold all vit D/ Ca products. Cont sensipar and non Ca binders.   5. Secondary hyperparathyroidism - ipTH 340 > 2 ,569 On 08/30/16 -  Corec Ca 8.6 this am / last phos 6.8 09/02/16  Have stopped VDRA; on sensipar 90  q supper /fosrenol 1 gm  Ac /2 Ca bath/calcitonin q day  5.Hypotension/volume- net UF 1.5  On 11/24; post wt 51 - nonstanding / may have trouble with standing wts  Sec leg pain /   6. Nutrition- alb 3.2 11/11- suspect it is lower now, add supplements= Nepro 7. Symptom management- Palliative care has seen with recommendations for care outlined.  8. NICM s/p AICD - EF 20-25% 06/19/2016   Lenny Pastel, PA-C [facility] Beeper [phone] 09/18/2016,8:09 AM  LOS: 5 days   Pt seen, examined, agree w assess/plan as above with additions as indicated.  Have increased dilaudid IV to 1-2mg  every 4 hrs.  Vinson Moselle MD [facility] pager [phone]    cell [phone] 09/18/2016, 11:19 AM     Labs: Basic Metabolic Panel:  Recent Labs Lab 09/17/16 0738 09/17/16 2309 09/18/16 0636  NA 138 140 142  K 3.6 3.7 3.5  CL 96* 99* 99*  CO2 24 24 27   GLUCOSE 80 90 79  BUN 51* 62* 69*  CREATININE 6.22* 7.09* 7.45*  CALCIUM 7.3* 7.9* 8.0*  CBC: CBG:  Recent Labs Lab 09/17/16 0000 09/17/16 0801 09/17/16 1207 09/17/16 1649 09/17/16 2030  GLUCAP 87 126* 106* 99 90    Studies/Results: No results found. Medications: . dextrose 5 % and 0.45% NaCl 20 mL/hr at 09/17/16 0855   . acetaminophen  500 mg Oral Q8H  . calcitonin (salmon)  1 spray Alternating Nares Daily  . cinacalcet  90 mg Oral Q supper  . darbepoetin (ARANESP) injection - DIALYSIS  60 mcg Intravenous Q Fri-HD  . feeding supplement (NEPRO CARB STEADY)  237 mL Oral TID WC  . feeding supplement (NEPRO CARB STEADY)  237 mL Oral BID BM  . gabapentin  100 mg Oral BID  . heparin subcutaneous  5,000 Units Subcutaneous Q8H  . lanthanum  1,000 mg Oral TID WC  . LORazepam  1 mg Oral QHS  . methocarbamol  500 mg Oral Q8H  . multivitamin  1 tablet Oral QHS  . piperacillin-tazobactam (ZOSYN)  IV  3.375 g Intravenous Q12H  . sertraline  100 mg Oral Daily  . vancomycin  500 mg Intravenous Q M,W,F-HD

## 2016-09-19 LAB — CBC
HEMATOCRIT: 30.3 % — AB (ref 36.0–46.0)
HEMOGLOBIN: 9.3 g/dL — AB (ref 12.0–15.0)
MCH: 27.6 pg (ref 26.0–34.0)
MCHC: 30.7 g/dL (ref 30.0–36.0)
MCV: 89.9 fL (ref 78.0–100.0)
PLATELETS: 418 10*3/uL — AB (ref 150–400)
RBC: 3.37 MIL/uL — AB (ref 3.87–5.11)
RDW: 22.5 % — ABNORMAL HIGH (ref 11.5–15.5)
WBC: 15.7 10*3/uL — AB (ref 4.0–10.5)

## 2016-09-19 LAB — HEPATITIS B SURFACE ANTIGEN: HEP B S AG: NEGATIVE

## 2016-09-19 LAB — RENAL FUNCTION PANEL
ANION GAP: 18 — AB (ref 5–15)
Albumin: 2.2 g/dL — ABNORMAL LOW (ref 3.5–5.0)
BUN: 85 mg/dL — ABNORMAL HIGH (ref 6–20)
CHLORIDE: 101 mmol/L (ref 101–111)
CO2: 23 mmol/L (ref 22–32)
CREATININE: 8.87 mg/dL — AB (ref 0.44–1.00)
Calcium: 8 mg/dL — ABNORMAL LOW (ref 8.9–10.3)
GFR, EST AFRICAN AMERICAN: 6 mL/min — AB (ref >60)
GFR, EST NON AFRICAN AMERICAN: 6 mL/min — AB (ref >60)
Glucose, Bld: 75 mg/dL (ref 65–99)
POTASSIUM: 3.8 mmol/L (ref 3.5–5.1)
Phosphorus: 3.3 mg/dL (ref 2.5–4.6)
Sodium: 142 mmol/L (ref 135–145)

## 2016-09-19 LAB — CULTURE, BLOOD (ROUTINE X 2)
CULTURE: NO GROWTH
Culture: NO GROWTH

## 2016-09-19 LAB — PATHOLOGIST SMEAR REVIEW

## 2016-09-19 MED ORDER — DIPHENHYDRAMINE HCL 50 MG/ML IJ SOLN
INTRAMUSCULAR | Status: AC
  Filled 2016-09-19: qty 1

## 2016-09-19 MED ORDER — HYDROMORPHONE HCL 1 MG/ML IJ SOLN
1.0000 mg | INTRAMUSCULAR | Status: DC | PRN
  Administered 2016-09-22: 2 mg via INTRAVENOUS
  Administered 2016-09-22: 1 mg via INTRAVENOUS
  Administered 2016-09-22 (×2): 2 mg via INTRAVENOUS
  Administered 2016-09-22 (×2): 1 mg via INTRAVENOUS
  Administered 2016-09-23 – 2016-09-25 (×12): 2 mg via INTRAVENOUS
  Administered 2016-09-25: 1 mg via INTRAVENOUS
  Administered 2016-09-25 (×2): 2 mg via INTRAVENOUS
  Administered 2016-09-25: 1 mg via INTRAVENOUS
  Administered 2016-09-25 – 2016-09-26 (×2): 2 mg via INTRAVENOUS
  Filled 2016-09-19 (×6): qty 2
  Filled 2016-09-19: qty 1
  Filled 2016-09-19 (×9): qty 2
  Filled 2016-09-19: qty 1
  Filled 2016-09-19: qty 2
  Filled 2016-09-19: qty 1
  Filled 2016-09-19 (×2): qty 2

## 2016-09-19 MED ORDER — LIDOCAINE HCL (PF) 1 % IJ SOLN: 5.0000 mL | INTRAMUSCULAR | Status: DC | PRN

## 2016-09-19 MED ORDER — SODIUM CHLORIDE 0.9% FLUSH: 9.0000 mL | INTRAVENOUS | Status: DC | PRN

## 2016-09-19 MED ORDER — LIDOCAINE-PRILOCAINE 2.5-2.5 % EX CREA: 1.0000 "application " | TOPICAL_CREAM | CUTANEOUS | Status: DC | PRN

## 2016-09-19 MED ORDER — HEPARIN SODIUM (PORCINE) 1000 UNIT/ML DIALYSIS: 1000.0000 [IU] | INTRAMUSCULAR | Status: DC | PRN

## 2016-09-19 MED ORDER — PENTAFLUOROPROP-TETRAFLUOROETH EX AERO: 1.0000 "application " | INHALATION_SPRAY | CUTANEOUS | Status: DC | PRN

## 2016-09-19 MED ORDER — NALOXONE HCL 0.4 MG/ML IJ SOLN: 0.4000 mg | INTRAMUSCULAR | Status: DC | PRN

## 2016-09-19 MED ORDER — DIPHENHYDRAMINE HCL 25 MG PO CAPS
25.0000 mg | ORAL_CAPSULE | ORAL | Status: DC | PRN
  Administered 2016-09-22 – 2016-09-26 (×2): 25 mg via ORAL
  Administered 2016-09-27: 50 mg via ORAL
  Filled 2016-09-19: qty 1
  Filled 2016-09-19: qty 2

## 2016-09-19 MED ORDER — VANCOMYCIN HCL 500 MG IV SOLR
500.0000 mg | Freq: Once | INTRAVENOUS | Status: AC
  Administered 2016-09-19: 500 mg via INTRAVENOUS
  Filled 2016-09-19: qty 500

## 2016-09-19 MED ORDER — POTASSIUM CHLORIDE CRYS ER 20 MEQ PO TBCR
20.0000 meq | EXTENDED_RELEASE_TABLET | Freq: Two times a day (BID) | ORAL | Status: DC
  Administered 2016-09-19 (×2): 20 meq via ORAL
  Filled 2016-09-19 (×2): qty 1

## 2016-09-19 MED ORDER — DIPHENHYDRAMINE HCL 50 MG/ML IJ SOLN
6.2500 mg | Freq: Once | INTRAMUSCULAR | Status: AC
  Administered 2016-09-19: 50 mg via INTRAVENOUS

## 2016-09-19 MED ORDER — ALTEPLASE 2 MG IJ SOLR: 2.0000 mg | Freq: Once | INTRAMUSCULAR | Status: DC | PRN

## 2016-09-19 MED ORDER — SODIUM CHLORIDE 0.9 % IV SOLN
25.0000 mg | INTRAVENOUS | Status: DC | PRN
  Filled 2016-09-19: qty 0.5

## 2016-09-19 MED ORDER — VANCOMYCIN HCL 500 MG IV SOLR
500.0000 mg | Freq: Once | INTRAVENOUS | Status: DC
  Filled 2016-09-19: qty 500

## 2016-09-19 MED ORDER — HYDROMORPHONE HCL 2 MG PO TABS
ORAL_TABLET | ORAL | Status: AC
  Filled 2016-09-19: qty 1

## 2016-09-19 MED ORDER — SODIUM CHLORIDE 0.9 % IV SOLN: 100.0000 mL | INTRAVENOUS | Status: DC | PRN

## 2016-09-19 MED ORDER — ONDANSETRON HCL 4 MG/2ML IJ SOLN: 4.0000 mg | Freq: Four times a day (QID) | INTRAMUSCULAR | Status: DC | PRN

## 2016-09-19 MED ORDER — HYDROMORPHONE 1 MG/ML IV SOLN
INTRAVENOUS | Status: AC
  Administered 2016-09-19: 12:00:00 via INTRAVENOUS
  Administered 2016-09-19: 0.843 mg via INTRAVENOUS
  Administered 2016-09-19: 2.85 mg via INTRAVENOUS
  Administered 2016-09-20: 8.56 mg via INTRAVENOUS
  Administered 2016-09-20: 4.54 mg via INTRAVENOUS
  Administered 2016-09-20: 4.41 mg via INTRAVENOUS
  Administered 2016-09-20: 2.94 mg via INTRAVENOUS
  Administered 2016-09-20 – 2016-09-21 (×2): via INTRAVENOUS
  Administered 2016-09-21: 5.09 mg via INTRAVENOUS
  Administered 2016-09-21: 2.65 mg via INTRAVENOUS
  Administered 2016-09-21: 2.41 mg via INTRAVENOUS
  Filled 2016-09-19 (×3): qty 25

## 2016-09-19 MED ORDER — HYDROMORPHONE HCL 1 MG/ML IJ SOLN
1.0000 mg | INTRAMUSCULAR | Status: AC
  Administered 2016-09-19: 1 mg via INTRAVENOUS

## 2016-09-19 MED ORDER — HYDROMORPHONE HCL 1 MG/ML IJ SOLN: 2.5000 mg | INTRAMUSCULAR | Status: DC | PRN

## 2016-09-19 MED ORDER — HYDROMORPHONE HCL 1 MG/ML IJ SOLN
INTRAMUSCULAR | Status: AC
  Filled 2016-09-19: qty 2

## 2016-09-19 NOTE — Care Management Note (Signed)
Case Management Note  Patient Details  Name: [name] Small MRN: [id number] Date of Birth: 05/25/1991  Subjective/Objective:    CM following for progression and d/c planning.                 Action/Plan: 09/19/2016 Pt will d/c to home, has supportive family. No HH or DME needs identified.   Expected Discharge Date:                  Expected Discharge Plan:  Home/Self Care  In-House Referral:  Clinical Social Work  Discharge planning Services  CM Consult  Post Acute Care Choice:  NA Choice offered to:  NA  DME Arranged:    DME Agency:     HH Arranged:    HH Agency:     Status of Service:  Completed, signed off  If discussed at [employer], dates discussed:    Additional Comments:  Starlyn Skeans, RN 09/19/2016, 11:58 AM

## 2016-09-19 NOTE — Progress Notes (Addendum)
Subjective:  No new c/o's, pain control is better but stil sig pain  Objective Vital signs in last 24 hours: Vitals:   09/19/16 0730 09/19/16 0800 09/19/16 0830 09/19/16 0900  BP: 122/81 112/90 124/74 118/82  Pulse: 100 (!) 104 (!) 103 (!) 107  Resp: 17 17 17 15   Temp:      TempSrc:      SpO2:      Weight:      Height:       Weight change: -0.318 kg (-11.2 oz)   Physical Exam  General:  Alert ,OX3 NAD, facial swelling Heart: RRR, no rub , 1/6 sem  Lungs:  CTA /no rales Abdomen: soft NT/ ND  Extremities: gangrenous skin changes bilat calves / lower legs; R thigh wrapped postop , not removed Dialysis Access:  Left fem perm  Cath Neuro: NF, ox3   Dialysis Orders: MWF 4h Opiflux 180 BFR 400/800 2K/2Ca EDW 49.5kg left fem TDC Heparin 2000 U IV Bolus q HD  Hectorol IV q HD  Aranesp 60 mcg IV q week (not given yet)  Assessment: 1. Right LE edema/ulceration -s/p removal of right thigh graft 11/24 due to blistering and skin necrosis/ eschar which developed 1-2 weeks after AVG placement. No pus in OR report and minimal fevers here. 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 Consider dc abx and/or see what vasc surgery thinks 2. ESRD-MWF , wasn't done yest due to hectic schedule 3. Anemia- hgb 9.3 -Aranesp 60 given 11/24 4. Leg wounds/ calciphylaxis - severely painful gangrenous skin changes bilat calves, clinical picture c/w calciphylaxis. Started Na thio TIW with HD, hold all vit D/ Ca products. Cont sensipar and non Ca binders. Use low Ca bath so will need K+supplements. Goal corr Ca low range of normal ~8-8.5, 9.5 today.   5. Sec HPTH - ipTH 2 ,569 On 08/30/16 -  Corec Ca 8.6 this am / last phos 6.8 09/02/16  Have stopped VDRA; on sensipar 90  q supper /fosrenol 1 gm  Ac /2 Ca bath/calcitonin q day  6. Hypotension/volume- net UF 1.5  On 11/24; post wt 51 - nonstanding / may have trouble with  standing wts  Sec leg pain /  7. Nutrition- alb 3.2 11/11- suspect it is lower now, add supplements= Nepro 8. Symptom management- Palliative care has seen with recommendations for care outlined.  9. NICM s/p AICD - EF 20-25% 06/19/2016 10. Volume - is up 4-5 kg today   Plan - HD today (off sched due to busy sched yest), Na thio, pain control. HD tomorrow.    Vinson Moselle MD [employer] pgr [phone]   09/19/2016, 9:20 AM         Labs: Basic Metabolic Panel:  Recent Labs Lab 09/17/16 2309 09/18/16 0636 09/19/16 0420  NA 140 142 142  K 3.7 3.5 3.8  CL 99* 99* 101  CO2 24 27 23   GLUCOSE 90 79 75  BUN 62* 69* 85*  CREATININE 7.09* 7.45* 8.87*  CALCIUM 7.9* 8.0* 8.0*  PHOS  --   --  3.3  CBC: CBG:  Recent Labs Lab 09/17/16 0000 09/17/16 0801 09/17/16 1207 09/17/16 1649 09/17/16 2030  GLUCAP 87 126* 106* 99 90    Studies/Results: No results found. Medications:  . acetaminophen  500 mg Oral Q8H  . calcitonin (salmon)  1 spray Alternating Nares Daily  . cinacalcet  90 mg Oral Q supper  .  darbepoetin (ARANESP) injection - DIALYSIS  60 mcg Intravenous Q Fri-HD  . diphenhydrAMINE      . enoxaparin (LOVENOX) injection  30 mg Subcutaneous Q24H  . feeding supplement (NEPRO CARB STEADY)  237 mL Oral TID WC  . feeding supplement (NEPRO CARB STEADY)  237 mL Oral BID BM  . gabapentin  100 mg Oral BID  . HYDROmorphone      . lanthanum  1,000 mg Oral TID WC  . LORazepam  1 mg Oral QHS  . methocarbamol  500 mg Oral Q8H  . multivitamin  1 tablet Oral QHS  . piperacillin-tazobactam (ZOSYN)  IV  3.375 g Intravenous Q12H  . sertraline  100 mg Oral Daily  . sodium thiosulfate infusion for calciphylaxis  25 g Intravenous Q M,W,F-HD  . vancomycin  500 mg Intravenous Q M,W,F-HD

## 2016-09-19 NOTE — Progress Notes (Signed)
Clarified with MD if ativan needs to be given as patient is on PCA dilaudid. Order received. [name] Small, [name] Small, Charity fundraiser

## 2016-09-19 NOTE — Progress Notes (Signed)
1900.  Patient used 9.62 mg dilaudid and had 19 demands with 18 deliveries.

## 2016-09-19 NOTE — Progress Notes (Signed)
Daily Progress Note   Patient Name: [name] Small       Date: 09/19/2016 DOB: 01/03/1991  Age: 25 y.o. MRN#: [id number] Attending Physician: Cleora Fleet, MD Primary Care Physician: Jeanann Lewandowsky, MD Admit Date: 09/13/2016  Reason for Consultation/Follow-up: Establishing goals of care and Pain control  Subjective: Patient in bed, whole body tense, whimpering. Reports pain in RLE, burning, hurting, constant. Was given 1mg  of IV dilaudid in HD approx 1 hr ago with very little relief. Per patient's RN, pain relief is minimal and pain returns quickly.  Brief discussion related to patient's current health status and GOC. She lives at home. Enjoys cooking. Her Mom and Dad are her main supports. She has been on dialysis for 7 years and doesn't really know any other life.     Review of Systems  Unable to perform ROS: Medical condition    Length of Stay: 6  Current Medications: Scheduled Meds:  . acetaminophen  500 mg Oral Q8H  . calcitonin (salmon)  1 spray Alternating Nares Daily  . cinacalcet  90 mg Oral Q supper  . darbepoetin (ARANESP) injection - DIALYSIS  60 mcg Intravenous Q Fri-HD  . diphenhydrAMINE      . enoxaparin (LOVENOX) injection  30 mg Subcutaneous Q24H  . feeding supplement (NEPRO CARB STEADY)  237 mL Oral TID WC  . feeding supplement (NEPRO CARB STEADY)  237 mL Oral BID BM  . gabapentin  100 mg Oral BID  . HYDROmorphone      . HYDROmorphone   Intravenous Q4H  . lanthanum  1,000 mg Oral TID WC  . LORazepam  1 mg Oral QHS  . methocarbamol  500 mg Oral Q8H  . multivitamin  1 tablet Oral QHS  . potassium chloride  20 mEq Oral BID  . sertraline  100 mg Oral Daily  . sodium thiosulfate infusion for calciphylaxis  25 g Intravenous Q M,W,F-HD  . vancomycin  500  mg Intravenous Q M,W,F-HD  . vancomycin  500 mg Intravenous Once    Continuous Infusions:   PRN Meds: acetaminophen, diphenhydrAMINE **OR** diphenhydrAMINE (BENADRYL) IVPB(SICKLE CELL ONLY), famotidine, HYDROmorphone (DILAUDID) injection, naloxone **AND** sodium chloride flush, ondansetron (ZOFRAN) IV  Physical Exam  Constitutional: She appears well-developed and well-nourished. She appears distressed.  Cardiovascular: Regular rhythm.   Tachycardic   Pulmonary/Chest: Effort  normal and breath sounds normal.  Abdominal: Soft. Bowel sounds are normal.  Skin:  RLE with blistering draining wound under yellow dressing, rest of wound is covered with ACE bandage.  Psychiatric:  tearful            Vital Signs: BP 105/71 (BP Location: Right Arm)   Pulse (!) 106   Temp 98.2 F (36.8 C) (Oral)   Resp 20   Ht 5\' 3"  (1.6 m)   Wt 51.4 kg (113 lb 5.1 oz)   SpO2 100%   BMI 20.07 kg/m  SpO2: SpO2: 100 % O2 Device: O2 Device: Not Delivered O2 Flow Rate: O2 Flow Rate (L/min): 0 L/min  Intake/output summary:  Intake/Output Summary (Last 24 hours) at 09/19/16 1405 Last data filed at 09/19/16 1000  Gross per 24 hour  Intake              340 ml  Output             3000 ml  Net            -2660 ml   LBM: Last BM Date: 09/18/16 Baseline Weight: Weight: 51.3 kg (113 lb) Most recent weight: Weight: 51.4 kg (113 lb 5.1 oz)       Palliative Assessment/Data: PPS: 60%   Flowsheet Rows   Flowsheet Row Most Recent Value  Intake Tab  Referral Department  Hospitalist  Unit at Time of Referral  Med/Surg Unit  Palliative Care Primary Diagnosis  Nephrology  Date Notified  09/14/16  Palliative Care Type  New Palliative care  Reason for referral  Clarify Goals of Care, Pain, Psychosocial or Spiritual support  Date of Admission  09/13/16  Date first seen by Palliative Care  09/16/16  # of days Palliative referral response time  2 Day(s)  # of days IP prior to Palliative referral  1  Clinical  Assessment  Palliative Performance Scale Score  40%  Pain Max last 24 hours  10  Pain Min Last 24 hours  4  Dyspnea Max Last 24 Hours  0  Dyspnea Min Last 24 hours  0  Nausea Max Last 24 Hours  0  Nausea Min Last 24 Hours  0  Anxiety Max Last 24 Hours  5  Anxiety Min Last 24 Hours  2  Psychosocial & Spiritual Assessment  Palliative Care Outcomes  Patient/Family meeting held?  Yes  Who was at the meeting?  pt  Palliative Care Outcomes  Improved pain interventions, Provided advance care planning  Palliative Care follow-up planned  Yes, Facility      Patient Active Problem List   Diagnosis Date Noted  . 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 Palliative care encounter   . Edema of right lower extremity 09/14/2016  . Leg edema, right 09/14/2016  . Cellulitis 09/13/2016  . Generalized anxiety disorder 09/05/2016  . Post-operative pain   . Labile blood pressure   . Closed nondisplaced fracture of pelvis (HCC)   . Other osteoporosis with current pathological fracture   . Recurrent major depressive disorder, in partial remission (HCC)   . Acute blood loss anemia   . 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 Anemia of chronic disease   . H/O medication noncompliance   . 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 Acute on chronic systolic CHF (congestive heart failure) (HCC) 08/16/2016  . Noncompliance of patient with renal dialysis (HCC) 08/12/2016  . 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  Osteoporosis 08/01/2016  . Thoracic compression fracture - T7, T8, T12 07/27/2016  . Acute pulmonary edema (HCC) 07/26/2016  . Essential hypertension 06/17/2016  . Depression with anxiety 06/17/2016  . Volume overload 04/14/2016  . Peripheral edema 04/14/2016  . NICM (nonischemic cardiomyopathy) (HCC) 04/14/2016  . Internal jugular (IJ) vein thromboembolism, acute (HCC) 04/14/2016  . Pericardial effusion 04/14/2016  . Chronic anticoagulation   .  Hypertensive urgency 04/11/2016  . DVT (deep venous thrombosis), left 04/11/2016  . Cellulitis of left upper extremity 03/31/2016  . Chronic pain syndrome 02/29/2016  . Chronic systolic heart failure (HCC) 01/04/2016  . AICD (automatic cardioverter/defibrillator) present 01/04/2016  . 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 Pathologic pelvic fracture 01/04/2016  . Depression 01/04/2016  . Pain in the chest   . ESRD on dialysis (HCC)   . Tachycardia   . Elevated serum hCG   . Chest pain 10/11/2015  . Fluid overload 10/10/2015  . Hypertension 10/10/2015  . Anemia of chronic renal failure, stage 5 (HCC) 10/04/2015  . Thrombocytopenia (HCC) 10/04/2015  . Chest pain of uncertain etiology 10/04/2015  . Chronically Elevated troponin 03/03/2015  . Hyperkalemia 02/21/2015  . Acute on chronic systolic heart failure (HCC) 02/21/2012  . Pulmonary infarct (HCC) 02/07/2012  . ESRD (end stage renal disease) on dialysis (HCC) 02/03/2012  . PE (pulmonary embolism) 02/03/2012    Palliative Care Assessment & Plan   Patient Profile: 25 y.o. female  with past medical history of End-stage renal disease secondary to polycystic disease, status post transplant in 2008, transplant failure in 2012, nephrectomy in 2012, now on hemodialysis, sickle cell anemia, chronic systolic heart failure with AICD placement (EF September 2000 1712%), PE DVT, opioid usage since age 55, noncompliance with hemodialysis and pain medicine admitted on 09/13/2016 with increased edema, and right lower extremity swelling and pain. Patient has been in the hospital 13 times in 2017. Patient has now acute on chronic pain secondary to recent compression fractures at levels T7, T8, T12 as well as pelvis. Patient also went to the OR on 09/15/2016 for skin graft revision on the right. She is verbalizing back pain for the past 2 months and severe right leg pain 1 week. She states the pain is been so severe that she is only walking a few steps  in her apartment. Her boyfriend comes over and does her dishes and cooking. She does not have a car and this is made it difficult for her to be compliant with some of her appointments as well as her worsening pain. She states her parents are very actively involved in her life and she describes a close relationship with them. She states her mother does all of her grocery shopping for her..   Assessment/Recommendations/Plan   GOC conversation deferred until pain is better controlled. Need to discuss with patient the high risk of mortality associated with calciphylaxis with necrotic wounds. I'm not sure she is aware of the seriousness of this diagnosis. Will attempt to arrange family meeting.   Pain r/t calciphylaxis wound, path shows necrosis, s/p debridement and graph- Dilaudid PCA initiated based on patient's last 24 hour requirements of 14mg  hydromorphone. Basal rate ordered at .5mg /hr with demand dose of .25mg . Lockout interval at 10 minutes and max 1hr dose is 2mg .     Goals of Care and Additional Recommendations:  Limitations on Scope of Treatment: Full Scope Treatment  Code Status:  Full code  Prognosis:   < 3 months based on ESRD with recent calciphylaxis diagnosis  Discharge Planning:  To Be Determined  Care plan was discussed with patient and patient's RN.   Thank you for allowing the Palliative Medicine Team to assist in the care of this patient.   Time In: 1015 Time Out: 1100 Total Time 45 mins Prolonged Time Billed No      Greater than 50%  of this time was spent counseling and coordinating care related to the above assessment and plan.  Ocie Bob, AGNP-C Palliative Medicine   Please contact Palliative Medicine Team phone at [phone] for questions and concerns.

## 2016-09-19 NOTE — Progress Notes (Signed)
Pharmacy Antibiotic Note  [name] Small is a 25 y.o. female admitted on 09/13/2016 with cellulitis.  Pharmacy has been consulted for Vancomycin/Zosyn dosing.   Today is Vancomycin/Zosyn D#6 for right lower extremity cellulitis with blisters/eschar over site of recent AV graft placement. Diffuse edema. S/p graft removal on 11/24. Tmax/24h: 99.4, WBC 15.7, ESRD-MWF however did not get HD 11/27 and received 11/28 AM.  Plan: 1. Vancomycin 500 mg x 1 dose today post HD (off-schedule) 2. Resume Vancomycin 500 mg/HD-MWF starting on 11/29 3. Continue Zosyn 3.375g IV every 12 hours (4 hr infusion) 4. Please address antibiotic LOT plans 5. Will continue to follow HD schedule/duration, culture results, LOT, and antibiotic de-escalation plans   Height: 5\' 3"  (160 cm) Weight: 113 lb 5.1 oz (51.4 kg) IBW/kg (Calculated) : 52.4  Temp (24hrs), Avg:98.4 F (36.9 C), Min:97.8 F (36.6 C), Max:99.4 F (37.4 C)   Recent Labs Lab 09/13/16 2130 09/14/16 0018 09/14/16 0539 09/15/16 0353 09/16/16 0516 09/17/16 0738 09/17/16 2309 09/18/16 0636 09/19/16 0420  WBC  --   --  16.6* 24.4*  --  14.0*  --  13.7* 15.7*  CREATININE  --   --  6.55* 7.39* 4.77* 6.22* 7.09* 7.45* 8.87*  LATICACIDVEN 2.38* 1.81  --  1.9  --   --   --   --   --     Estimated Creatinine Clearance: 7.9 mL/min (by C-G formula based on SCr of 8.87 mg/dL (H)).    Allergies  Allergen Reactions  . Aspirin Other (See Comments)    Interacts with Coreg  . Tramadol Anaphylaxis  . Vicodin [Hydrocodone-Acetaminophen] Hives  . Buprenorphine Hcl Itching    Ok with oxycodone  . Iohexol Itching  . Morphine And Related Itching and Other (See Comments)    Ok with oxycodone    Antimicrobials this admission:  Vancomycin 11/23 >> Zosyn 11/23 >>   Dose adjustments this admission:  n/a  Microbiology results:  11/23 BCx x 2: ngtd 11/24 Wound Rt thigh: CoNS (contaminant or pathogen)  Thank you for allowing pharmacy to be a part of  this patient's care.  Georgina Pillion, PharmD, BCPS Clinical Pharmacist Pager: [phone] Clinical phone for 09/19/2016 from 7a-3:30p: [phone] If after 3:30p, please call main pharmacy at: [phone] 09/19/2016 11:43 AM

## 2016-09-19 NOTE — Progress Notes (Signed)
PROGRESS NOTE [facility]   [place]   UXN:[id number] DOB: Dec 19, 1990  DOA: 09/13/2016 PCP: Jeanann Lewandowsky, MD   Brief Narrative:  [name] a 25 y.o. 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 Recently hospitalized from 10/22-11/10/17 with T7,T8,T12 vertebral compression fractures and volume overload related to noncompliance with HD. She had a new right femoral AVG placed during admission on 08/31/16. Graft has not been used for HD.  Following her most recent discharge she transferred to [facility] for HD. Her last HD was on 11/20 for 3 hours 27 mins. She has been attending HD since discharge though she did miss HD yesterday  Presents to the ER because of increasing swelling of the right lower extremity with ulcerations and blisters. CT scan was done which was showing diffuse edema. Patient has had recent AV graft placed on right lower extremity and gets her dialysis done on the left lower extremity dialysis catheter. On exam patient has eschar over the recent AV graft placement on the right lower extremity with blisters in the lower extremity both left and right. There is diffuse edema of the right lower extremity.  Subjective: Pt says pain a little better controlled, but not fully controlled.   Assessment & Plan:   Right lower extremity edema with blisters and ulceration/infected AV graft Right thigh - now with Calciphylaxis;  s/p graft removal and biopsy Vascular surgery recommendations appreciated  Poorly healing right thigh av graft with skin necrosis   Cultures no growth to date  Continue IV empiric antibiotics per surgery Patient with painful bilateral lower extremity,  could be also due sickle cell given patient dehydration - improving with current pain management  Palliative care consult appreciated, PCA started 11/28  Hypotension - borderline low  Will monitor for now  Careful with pain meds   ESRD on hemodialysis M/W/F - Electrolyte abnormalities  Renal recommendations appreciated Monitor BMP  HD today and tomorrow   Nonischemic cardiomyopathy S/P AICD placement - no signs of fluid overload EF 06/2016 =12% Strict I&O Daily weights  T7,T8,T12 vertebral compression fractures Pain management adjusted 11/27 - continue to monitor BP  PCA started by palliative care on 11/28  Chronic anemia - hemoglobin stable at baseline Most likely from ESRD  DVT prophylaxis: Pt refusing all heparin injections: switched to lovenox 11/27 and still refusing, counseled patient about risks including death Code Status: Full Family Communication: Pt Disposition Plan: Home when medically stable  Consultants:   Vascular surgery Dr. Randie Heinz  Nephrology Dr. Briant Cedar  Procedures:  11/22 CT bilateral lower extremity:-No evidence of fracture or dislocation. -. Diffuse soft tissue edema involving right hip/entirety of right lower extremity, with large 5.4 cm fluid-filled blister along the anterolateral right lower leg. Multiple smaller blisters along the medial right thigh. Negative abscess - Small bilateral knee joint effusions, Rt>> Lt   Antimicrobials:  Zosyn 11/22  Vancomycin 11/22  Objective: Vitals:   09/19/16 0830 09/19/16 0900 09/19/16 0930 09/19/16 1000  BP: 124/74 118/82 112/85 105/71  Pulse: (!) 103 (!) 107 (!) 104 (!) 106  Resp: 17 15 15 20   Temp:    98.2 F (36.8 C)  TempSrc:    Oral  SpO2:    95%  Weight:    51.4 kg (113 lb 5.1 oz)  Height:  Intake/Output Summary (Last 24 hours) at 09/19/16 1115 Last data filed at 09/19/16 1000  Gross per 24 hour  Intake              580 ml  Output             3000 ml  Net            -2420 ml    Filed Weights   09/18/16 2227 09/19/16 0635 09/19/16 1000  Weight: 56.1 kg (123 lb 11.2 oz) 54.9 kg (121 lb 0.5 oz) 51.4 kg (113 lb 5.1 oz)   Examination:  General exam: Lying in bed appears to be comfortable  Respiratory system: Clear to auscultation.  Cardiovascular system: S1 & S2 heard, RRR.  Extremities: Range of movement continues to improve. Right lower extremity with edema decreasing. Penrose on R thigh    Skin: Blister and extremities. Fistula in the left arm Psychiatry: Withdrawn and flat affect  Data Reviewed: I have personally reviewed following labs and imaging studies  CBC:  Recent Labs Lab 09/13/16 1805  09/14/16 0539 09/15/16 0353 09/17/16 0738 09/18/16 0636 09/19/16 0420  WBC 13.9*  --  16.6* 24.4* 14.0* 13.7* 15.7*  NEUTROABS 11.2*  --   --  22.2*  --  10.8*  --   HGB 9.6*  < > 9.5* 8.9* 9.3* 9.3* 9.3*  HCT 30.9*  < > 31.2* 29.8* 31.3* 30.7* 30.3*  MCV 90.1  --  89.9 90.6 90.7 89.8 89.9  PLT 355  --  362 376 364 380 418*  < > = values in this interval not displayed. Basic Metabolic Panel:  Recent Labs Lab 09/14/16 1519  09/16/16 0516 09/17/16 0738 09/17/16 2309 09/18/16 0636 09/19/16 0420  NA  --   < > 138 138 140 142 142  K  --   < > 3.3* 3.6 3.7 3.5 3.8  CL  --   < > 95* 96* 99* 99* 101  CO2  --   < > 24 24 24 27 23   GLUCOSE  --   < > 81 80 90 79 75  BUN  --   < > 37* 51* 62* 69* 85*  CREATININE  --   < > 4.77* 6.22* 7.09* 7.45* 8.87*  CALCIUM  --   < > 7.8* 7.3* 7.9* 8.0* 8.0*  MG 2.2  --   --   --   --   --   --   PHOS  --   --   --   --   --   --  3.3  < > = values in this interval not displayed. GFR: Estimated Creatinine Clearance: 7.9 mL/min (by C-G formula based on SCr of 8.87 mg/dL (H)). Liver Function Tests:  Recent Labs Lab 09/19/16 0420  ALBUMIN 2.2*   No results for input(s): LIPASE, AMYLASE in the last 168 hours. No results for input(s): AMMONIA in the last 168 hours. Coagulation Profile: No results for input(s):  INR, PROTIME in the last 168 hours. Cardiac Enzymes: No results for input(s): CKTOTAL, CKMB, CKMBINDEX, TROPONINI in the last 168 hours. BNP (last 3 results) No results for input(s): PROBNP in the last 8760 hours. HbA1C: No results for input(s): HGBA1C in the last 72 hours. CBG:  Recent Labs Lab 09/17/16 0000 09/17/16 0801 09/17/16 1207 09/17/16 1649 09/17/16 2030  GLUCAP 87 126* 106* 99 90   Lipid Profile: No results for input(s): CHOL, HDL, LDLCALC, TRIG, CHOLHDL, LDLDIRECT in the last 72 hours. Thyroid Function Tests:  No results for input(s): TSH, T4TOTAL, FREET4, T3FREE, THYROIDAB in the last 72 hours. Anemia Panel: No results for input(s): VITAMINB12, FOLATE, FERRITIN, TIBC, IRON, RETICCTPCT in the last 72 hours. Sepsis Labs:  Recent Labs Lab 09/13/16 2130 09/14/16 0018 09/15/16 0353  LATICACIDVEN 2.38* 1.81 1.9    Recent Results (from the past 240 hour(s))  Culture, blood (routine x 2)     Status: None (Preliminary result)   Collection Time: 09/14/16  3:23 PM  Result Value Ref Range Status   Specimen Description BLOOD RIGHT HAND  Final   Special Requests IN PEDIATRIC BOTTLE 2CC  Final   Culture NO GROWTH 4 DAYS  Final   Report Status PENDING  Incomplete  Culture, blood (routine x 2)     Status: None (Preliminary result)   Collection Time: 09/14/16  3:34 PM  Result Value Ref Range Status   Specimen Description BLOOD RIGHT ARM  Final   Special Requests IN PEDIATRIC BOTTLE 2CC  Final   Culture NO GROWTH 4 DAYS  Final   Report Status PENDING  Incomplete  Aerobic/Anaerobic Culture (surgical/deep wound)     Status: None (Preliminary result)   Collection Time: 09/15/16  5:34 PM  Result Value Ref Range Status   Specimen Description WOUND RIGHT THIGH  Final   Special Requests PATIENT ON FOLLOWING ZOSYN NO ANAEROBIC SWAB SENT  Final   Gram Stain NO WBC SEEN NO ORGANISMS SEEN   Final   Culture   Final    RARE STAPHYLOCOCCUS SPECIES (COAGULASE NEGATIVE) NO  ANAEROBES ISOLATED; CULTURE IN PROGRESS FOR 5 DAYS    Report Status PENDING  Incomplete    Radiology Studies: No results found.  Scheduled Meds: . acetaminophen  500 mg Oral Q8H  . calcitonin (salmon)  1 spray Alternating Nares Daily  . cinacalcet  90 mg Oral Q supper  . darbepoetin (ARANESP) injection - DIALYSIS  60 mcg Intravenous Q Fri-HD  . diphenhydrAMINE      . enoxaparin (LOVENOX) injection  30 mg Subcutaneous Q24H  . feeding supplement (NEPRO CARB STEADY)  237 mL Oral TID WC  . feeding supplement (NEPRO CARB STEADY)  237 mL Oral BID BM  . gabapentin  100 mg Oral BID  . HYDROmorphone      . HYDROmorphone   Intravenous Q4H  . lanthanum  1,000 mg Oral TID WC  . LORazepam  1 mg Oral QHS  . methocarbamol  500 mg Oral Q8H  . multivitamin  1 tablet Oral QHS  . piperacillin-tazobactam (ZOSYN)  IV  3.375 g Intravenous Q12H  . potassium chloride  20 mEq Oral BID  . sertraline  100 mg Oral Daily  . sodium thiosulfate infusion for calciphylaxis  25 g Intravenous Q M,W,F-HD  . vancomycin  500 mg Intravenous Q M,W,F-HD   Continuous Infusions:    LOS: 6 days   Standley Dakins, MD [facility] Pager [phone]  If 7PM-7AM, please contact night-coverage [email or web address] Password [email or web address] 09/19/2016, 11:15 AM

## 2016-09-19 NOTE — Progress Notes (Signed)
  Progress Note    09/19/2016 1:16 PM 4 Days Post-Op  Subjective:  Pain throughout rle  Vitals:   09/19/16 1000 09/19/16 1213  BP: 105/71   Pulse: (!) 106   Resp: 20 20  Temp: 98.2 F (36.8 C)     Physical Exam: Right lateral hip and medial thigh with full thickness skin that is stable Groin incision and penroses removed Right leg edema continues to improve, right foot is warm  CBC    Component Value Date/Time   WBC 15.7 (H) 09/19/2016 0420   RBC 3.37 (L) 09/19/2016 0420   HGB 9.3 (L) 09/19/2016 0420   HCT 30.3 (L) 09/19/2016 0420   PLT 418 (H) 09/19/2016 0420   MCV 89.9 09/19/2016 0420   MCH 27.6 09/19/2016 0420   MCHC 30.7 09/19/2016 0420   RDW 22.5 (H) 09/19/2016 0420   LYMPHSABS 1.6 09/18/2016 0636   MONOABS 1.0 09/18/2016 0636   EOSABS 0.3 09/18/2016 0636   BASOSABS 0.0 09/18/2016 0636    BMET    Component Value Date/Time   NA 142 09/19/2016 0420   K 3.8 09/19/2016 0420   CL 101 09/19/2016 0420   CO2 23 09/19/2016 0420   GLUCOSE 75 09/19/2016 0420   BUN 85 (H) 09/19/2016 0420   CREATININE 8.87 (H) 09/19/2016 0420   CALCIUM 8.0 (L) 09/19/2016 0420   GFRNONAA 6 (L) 09/19/2016 0420   GFRAA 6 (L) 09/19/2016 0420    INR    Component Value Date/Time   INR 1.35 06/17/2016 0229     Intake/Output Summary (Last 24 hours) at 09/19/16 1316 Last data filed at 09/19/16 1000  Gross per 24 hour  Intake              340 ml  Output             3000 ml  Net            -2660 ml     Assessment:  25 y.o. female is avg excision r thigh, path is necrotic skin, leukocytosis worse again today  Plan: Continue abx Penrose drains removed Xeroform gauze to skin, no plan to debride at this time and discussed this with patient.  Laken Rog C. Randie Heinz, MD [facility] Office: [phone] Pager: [phone]  09/19/2016 1:16 PM

## 2016-09-20 DIAGNOSIS — M79604 Pain in right leg: Secondary | ICD-10-CM

## 2016-09-20 DIAGNOSIS — Z515 Encounter for palliative care: Secondary | ICD-10-CM

## 2016-09-20 DIAGNOSIS — R600 Localized edema: Secondary | ICD-10-CM

## 2016-09-20 DIAGNOSIS — Z7189 Other specified counseling: Secondary | ICD-10-CM

## 2016-09-20 LAB — CBC
HEMATOCRIT: 28.8 % — AB (ref 36.0–46.0)
Hemoglobin: 8.8 g/dL — ABNORMAL LOW (ref 12.0–15.0)
MCH: 26.8 pg (ref 26.0–34.0)
MCHC: 30.6 g/dL (ref 30.0–36.0)
MCV: 87.8 fL (ref 78.0–100.0)
Platelets: 424 10*3/uL — ABNORMAL HIGH (ref 150–400)
RBC: 3.28 MIL/uL — ABNORMAL LOW (ref 3.87–5.11)
RDW: 22.4 % — AB (ref 11.5–15.5)
WBC: 13.9 10*3/uL — AB (ref 4.0–10.5)

## 2016-09-20 LAB — RENAL FUNCTION PANEL
Albumin: 2.4 g/dL — ABNORMAL LOW (ref 3.5–5.0)
Anion gap: 22 — ABNORMAL HIGH (ref 5–15)
BUN: 41 mg/dL — ABNORMAL HIGH (ref 6–20)
CALCIUM: 8 mg/dL — AB (ref 8.9–10.3)
CHLORIDE: 102 mmol/L (ref 101–111)
CO2: 18 mmol/L — AB (ref 22–32)
CREATININE: 5.9 mg/dL — AB (ref 0.44–1.00)
GFR calc Af Amer: 11 mL/min — ABNORMAL LOW (ref >60)
GFR, EST NON AFRICAN AMERICAN: 9 mL/min — AB (ref >60)
GLUCOSE: 73 mg/dL (ref 65–99)
PHOSPHORUS: 2.5 mg/dL (ref 2.5–4.6)
POTASSIUM: 4.9 mmol/L (ref 3.5–5.1)
Sodium: 142 mmol/L (ref 135–145)

## 2016-09-20 LAB — AEROBIC/ANAEROBIC CULTURE (SURGICAL/DEEP WOUND)

## 2016-09-20 LAB — AEROBIC/ANAEROBIC CULTURE W GRAM STAIN (SURGICAL/DEEP WOUND): Gram Stain: NONE SEEN

## 2016-09-20 MED ORDER — DIPHENHYDRAMINE HCL 50 MG/ML IJ SOLN
25.0000 mg | Freq: Once | INTRAMUSCULAR | Status: AC
  Administered 2016-09-20: 25 mg via INTRAVENOUS
  Filled 2016-09-20: qty 1

## 2016-09-20 MED ORDER — POTASSIUM CHLORIDE CRYS ER 20 MEQ PO TBCR
20.0000 meq | EXTENDED_RELEASE_TABLET | Freq: Every day | ORAL | Status: DC
  Administered 2016-09-20 – 2016-09-26 (×7): 20 meq via ORAL
  Filled 2016-09-20 (×7): qty 1

## 2016-09-20 MED ORDER — VANCOMYCIN HCL IN DEXTROSE 500-5 MG/100ML-% IV SOLN
INTRAVENOUS | Status: AC
Start: 2016-09-20 — End: 2016-09-20
  Administered 2016-09-20: 500 mg
  Filled 2016-09-20: qty 100

## 2016-09-20 MED ORDER — DIPHENHYDRAMINE HCL 25 MG PO CAPS
25.0000 mg | ORAL_CAPSULE | ORAL | Status: DC
  Filled 2016-09-20: qty 1

## 2016-09-20 MED ORDER — DIPHENHYDRAMINE HCL 50 MG/ML IJ SOLN
INTRAMUSCULAR | Status: AC
  Administered 2016-09-20: 25 mg
  Filled 2016-09-20: qty 1

## 2016-09-20 MED ORDER — LIDOCAINE 5 % EX OINT
TOPICAL_OINTMENT | Freq: Three times a day (TID) | CUTANEOUS | Status: DC | PRN
  Filled 2016-09-20: qty 35.44

## 2016-09-20 NOTE — Progress Notes (Signed)
Wasted 2.5cc of dilaudid from PCA in the sink.waste witnessed by [employer]. Dezaree Tracey, Drinda Butts, Charity fundraiser

## 2016-09-20 NOTE — Progress Notes (Signed)
[facility] Progress Note  Dialysis Orders: MWF 4h Opiflux 180 BFR 400/800 2K/2Ca EDW 49.5kg left fem TDC Heparin 2000 U IV Bolus q HD  Hectorol IV q HD  Aranesp 60 mcg IV q week (not given yet)  Assessment: 1. Right LE edema/ulceration -s/p removal of right thigh graft 11/24 due to blistering and skin necrosis/ eschar which developed 1-2 weeks after AVG placement. No pus in OR report and minimal fevers here. 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 Started on Na thiosulfate yesterday- reaction she felt during tmt could be related.  She will get again today and will see if she has a similar reaction. 2.  ESRD-MWF - had Monday HD Tuesday due to high patient volume - back on schedule today. K supplements given to keep K up during treatment. K 4.9 today . Using 2 K 2 Ca bath. Decrease K to 20 meq per day. Try liberalizing diet. Will given benadryl with HD to help with itching. 3. Anemia- hgb 8.8-Aranesp 60 given 11/24; last tsat 11% 10/27 - recheck with HD today 4. Leg wounds/ calciphylaxis - severely painful gangrenous skin changes bilat calves, clinical picture c/w calciphylaxis. Started Na thio TIW with HD, hold all vit D/ Ca products. Cont sensipar and non Ca binders. Use low Ca bath so will need K+supplements. Goal corr Ca low range of normal ~8-8.5, 9.5 today.   5. Sec HPTH - ipTH 2 ,569 On 08/30/16 -  P low at 2.5 - have liberalized diet so there may be more P; keep same fosrenol dose  Have stopped VDRA; on sensipar 90  q supper /fosrenol 1 gm  Ac /2 Ca bath/calcitonin q day  6. Hypotension/volume- net UF 3 L Tuesday post weight 51.4- trying to get down to edw today 7. Nutrition- alb 2.5, liberalized diet to ^ K 8. Symptom management- Palliative care has seen with recommendations for care outlined.  9. NICM s/p AICD - EF 20-25% 06/19/2016  Sheffield Slider, PA-C [facility] Beeper [phone] 09/20/2016,8:58 AM  LOS: 7 days   Pt seen, examined and agree w A/P as above.  Vinson Moselle MD [employer] pager [phone]   09/20/2016, 2:08 PM    Subjective:   Had a burning sensation like blood running through her body while on dialysis yesterday. Also has itching every dialysis treatment and requesting "that medicine for this."  Objective Vitals:   09/20/16 0308 09/20/16 0530 09/20/16 0744 09/20/16 0746  BP:  (!) 130/97    Pulse:  88    Resp: 12 15 10 11   Temp:  98.1 F (36.7 C)    TempSrc:  Oral    SpO2: 100% 98% 98% 97%  Weight:      Height:       Physical Exam General: NAD Heart: RRR Lungs:  No rales Abdomen: soft NT Extremities: right thigh redressed earlier this am; multiple wounds on both legs with dressing Dialysis Access: left fem cath   Additional Objective Labs: Basic Metabolic Panel:  Recent Labs Lab 09/18/16 0636 09/19/16 0420 09/20/16 0639  NA 142 142 142  K 3.5 3.8 4.9  CL 99* 101 102  CO2 27 23 18*  GLUCOSE 79 75 73  BUN 69* 85* 41*  CREATININE 7.45* 8.87* 5.90*  CALCIUM 8.0* 8.0* 8.0*  PHOS  --  3.3 2.5   Liver Function Tests:  Recent Labs Lab 09/19/16 0420 09/20/16 3013  ALBUMIN 2.2* 2.4*   No results for input(s): LIPASE, AMYLASE in the last 168 hours. CBC:  Recent Labs Lab 09/13/16 1805  09/15/16 0353 09/17/16 0738 09/18/16 0636 09/19/16 0420 09/20/16 0639  WBC 13.9*  < > 24.4* 14.0* 13.7* 15.7* 13.9*  NEUTROABS 11.2*  --  22.2*  --  10.8*  --   --   HGB 9.6*  < > 8.9* 9.3* 9.3* 9.3* 8.8*  HCT 30.9*  < > 29.8* 31.3* 30.7* 30.3* 28.8*  MCV 90.1  < > 90.6 90.7 89.8 89.9 87.8  PLT 355  < > 376 364 380 418* 424*  < > = values in this interval not displayed. Blood Culture    Component Value Date/Time   SDES WOUND RIGHT THIGH 09/15/2016 1734   SPECREQUEST PATIENT ON FOLLOWING ZOSYN NO ANAEROBIC SWAB SENT 09/15/2016 1734   CULT  09/15/2016 1734    RARE STAPHYLOCOCCUS  SPECIES (COAGULASE NEGATIVE) CALL MICROBIOLOGY LAB IF SENSITIVITIES ARE REQUIRED. NO ANAEROBES ISOLATED    REPTSTATUS 09/20/2016 FINAL 09/15/2016 1734    Cardiac Enzymes: No results for input(s): CKTOTAL, CKMB, CKMBINDEX, TROPONINI in the last 168 hours. CBG:  Recent Labs Lab 09/17/16 0000 09/17/16 0801 09/17/16 1207 09/17/16 1649 09/17/16 2030  GLUCAP 87 126* 106* 99 90   Iron Studies: No results for input(s): IRON, TIBC, TRANSFERRIN, FERRITIN in the last 72 hours. Lab Results  Component Value Date   INR 1.35 06/17/2016   INR 1.55 (H) 04/12/2016   INR 1.35 03/31/2016   Studies/Results: No results found. Medications:  . acetaminophen  500 mg Oral Q8H  . calcitonin (salmon)  1 spray Alternating Nares Daily  . cinacalcet  90 mg Oral Q supper  . darbepoetin (ARANESP) injection - DIALYSIS  60 mcg Intravenous Q Fri-HD  . enoxaparin (LOVENOX) injection  30 mg Subcutaneous Q24H  . feeding supplement (NEPRO CARB STEADY)  237 mL Oral TID WC  . feeding supplement (NEPRO CARB STEADY)  237 mL Oral BID BM  . gabapentin  100 mg Oral BID  . HYDROmorphone   Intravenous Q4H  . lanthanum  1,000 mg Oral TID WC  . LORazepam  1 mg Oral QHS  . methocarbamol  500 mg Oral Q8H  . multivitamin  1 tablet Oral QHS  . potassium chloride  20 mEq Oral BID  . sertraline  100 mg Oral Daily  . sodium thiosulfate infusion for calciphylaxis  25 g Intravenous Q M,W,F-HD  . vancomycin  500 mg Intravenous Q M,W,F-HD

## 2016-09-20 NOTE — Progress Notes (Signed)
PROGRESS NOTE [facility]   [place]   ZOX:[id number] DOB: 1991-07-29  DOA: 09/13/2016 PCP: Jeanann Lewandowsky, MD   Brief Narrative:  [name] Small a 25 y.o. 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 Recently hospitalized from 10/22-11/10/17 with T7,T8,T12 vertebral compression fractures and volume overload related to noncompliance with HD. She had a new right femoral AVG placed during admission on 08/31/16. Graft has not been used for HD.  Following her most recent discharge she transferred to [facility] for HD. Her last HD was on 11/20 for 3 hours 27 mins. Presented to the ER because of increasing swelling of the right lower extremity with ulcerations and blisters. CT scan was done which was showing diffuse edema. Patient has had recent AV graft placed on right lower extremity and gets her dialysis done on the left lower extremity dialysis catheter. On exam patient has eschar over the recent AV graft placement on the right lower extremity with blisters in the lower extremity both left and right. There is diffuse edema of the right lower extremity.  Subjective: Pain better on Dilaudid PCA  Assessment & Plan:   Right lower extremity edema with blisters and ulceration/infected AV graft Right thigh - now with Calciphylaxis;  s/p graft removal and biopsy - Vascular surgery following - Poorly healing right thigh av graft with skin necrosis   - Cultures no growth to date  - on IV empiric antibiotics per surgery - overall poor prognosis - Palliative care consult appreciated, PCA started 11/28  Hypotension - borderline low  -soft, monitor  ESRD on hemodialysis TTS - Electrolyte  abnormalities  -Renal recommendations appreciated -s/p HS yesterday  Nonischemic cardiomyopathy S/P AICD placement  -EF 06/2016 =12% -volume managed with HD  T7,T8,T12 vertebral compression fractures -supportive care, PCA pump per palliative care on 11/28  Chronic anemia - hemoglobin stable at baseline -stable  DVT prophylaxis: Pt refusing all heparin injections: switched to lovenox 11/27 and still refusing, counseled patient about risks including death Code Status: Full Family Communication: none at bedside Disposition Plan: home when stable  Consultants:   Vascular surgery Dr. Randie Heinz  Nephrology Dr. Briant Cedar  Procedures:  11/22 CT bilateral lower extremity:-No evidence of fracture or dislocation. -. Diffuse soft tissue edema involving right hip/entirety of right lower extremity, with large 5.4 cm fluid-filled blister along the anterolateral right lower leg. Multiple smaller blisters along the medial right thigh. Negative abscess - Small bilateral knee joint effusions, Rt>> Lt   Antimicrobials:  Zosyn 11/22  Vancomycin 11/22  Objective: Vitals:   09/20/16 1330 09/20/16 1400 09/20/16 1414 09/20/16 1447  BP: 103/82 116/86 118/84 115/83  Pulse: 95 100 96 95  Resp:   16 18  Temp:   98.9 F (37.2 C) 98.8 F (37.1 C)  TempSrc:   Oral Oral  SpO2:   100% 95%  Weight:      Height:        Intake/Output Summary (Last 24 hours) at 09/20/16 1517 Last data filed at 09/20/16 1414  Gross per 24 hour  Intake           249.62 ml  Output             4000 ml  Net         -3750.38 ml   Ceasar Mons  Weights   09/19/16 1000 09/19/16 2152 09/20/16 1005  Weight: 51.4 kg (113 lb 5.1 oz) 52.2 kg (115 lb 1.3 oz) 54.5 kg (120 lb 2.4 oz)   Examination:  General exam: AAOx3, chronically ill appearing Respiratory system: Clear to auscultation.  Cardiovascular system: S1 & S2 heard, RRR.  Extremities: Range of movement continues to improve. Right lower extremity with edema decreasing.  Penrose on R thigh    Skin: Blister in legs/ extremities. Fistula in the left arm Psychiatry: Withdrawn and flat affect  Data Reviewed: I have personally reviewed following labs and imaging studies  CBC:  Recent Labs Lab 09/13/16 1805  09/15/16 0353 09/17/16 0738 09/18/16 0636 09/19/16 0420 09/20/16 0639  WBC 13.9*  < > 24.4* 14.0* 13.7* 15.7* 13.9*  NEUTROABS 11.2*  --  22.2*  --  10.8*  --   --   HGB 9.6*  < > 8.9* 9.3* 9.3* 9.3* 8.8*  HCT 30.9*  < > 29.8* 31.3* 30.7* 30.3* 28.8*  MCV 90.1  < > 90.6 90.7 89.8 89.9 87.8  PLT 355  < > 376 364 380 418* 424*  < > = values in this interval not displayed. Basic Metabolic Panel:  Recent Labs Lab 09/14/16 1519  09/17/16 0738 09/17/16 2309 09/18/16 0636 09/19/16 0420 09/20/16 0639  NA  --   < > 138 140 142 142 142  K  --   < > 3.6 3.7 3.5 3.8 4.9  CL  --   < > 96* 99* 99* 101 102  CO2  --   < > 24 24 27 23  18*  GLUCOSE  --   < > 80 90 79 75 73  BUN  --   < > 51* 62* 69* 85* 41*  CREATININE  --   < > 6.22* 7.09* 7.45* 8.87* 5.90*  CALCIUM  --   < > 7.3* 7.9* 8.0* 8.0* 8.0*  MG 2.2  --   --   --   --   --   --   PHOS  --   --   --   --   --  3.3 2.5  < > = values in this interval not displayed. GFR: Estimated Creatinine Clearance: 12.1 mL/min (by C-G formula based on SCr of 5.9 mg/dL (H)). Liver Function Tests:  Recent Labs Lab 09/19/16 0420 09/20/16 0639  ALBUMIN 2.2* 2.4*   No results for input(s): LIPASE, AMYLASE in the last 168 hours. No results for input(s): AMMONIA in the last 168 hours. Coagulation Profile: No results for input(s): INR, PROTIME in the last 168 hours. Cardiac Enzymes: No results for input(s): CKTOTAL, CKMB, CKMBINDEX, TROPONINI in the last 168 hours. BNP (last 3 results) No results for input(s): PROBNP in the last 8760 hours. HbA1C: No results for input(s): HGBA1C in the last 72 hours. CBG:  Recent Labs Lab 09/17/16 0000 09/17/16 0801 09/17/16 1207 09/17/16 1649 09/17/16 2030    GLUCAP 87 126* 106* 99 90   Lipid Profile: No results for input(s): CHOL, HDL, LDLCALC, TRIG, CHOLHDL, LDLDIRECT in the last 72 hours. Thyroid Function Tests: No results for input(s): TSH, T4TOTAL, FREET4, T3FREE, THYROIDAB in the last 72 hours. Anemia Panel: No results for input(s): VITAMINB12, FOLATE, FERRITIN, TIBC, IRON, RETICCTPCT in the last 72 hours. Sepsis Labs:  Recent Labs Lab 09/13/16 2130 09/14/16 0018 09/15/16 0353  LATICACIDVEN 2.38* 1.81 1.9    Recent Results (from the past 240 hour(s))  Culture, blood (routine x 2)     Status: None  Collection Time: 09/14/16  3:23 PM  Result Value Ref Range Status   Specimen Description BLOOD RIGHT HAND  Final   Special Requests IN PEDIATRIC BOTTLE 2CC  Final   Culture NO GROWTH 5 DAYS  Final   Report Status 09/19/2016 FINAL  Final  Culture, blood (routine x 2)     Status: None   Collection Time: 09/14/16  3:34 PM  Result Value Ref Range Status   Specimen Description BLOOD RIGHT ARM  Final   Special Requests IN PEDIATRIC BOTTLE 2CC  Final   Culture NO GROWTH 5 DAYS  Final   Report Status 09/19/2016 FINAL  Final  Aerobic/Anaerobic Culture (surgical/deep wound)     Status: None   Collection Time: 09/15/16  5:34 PM  Result Value Ref Range Status   Specimen Description WOUND RIGHT THIGH  Final   Special Requests PATIENT ON FOLLOWING ZOSYN NO ANAEROBIC SWAB SENT  Final   Gram Stain NO WBC SEEN NO ORGANISMS SEEN   Final   Culture   Final    RARE STAPHYLOCOCCUS SPECIES (COAGULASE NEGATIVE) CALL MICROBIOLOGY LAB IF SENSITIVITIES ARE REQUIRED. NO ANAEROBES ISOLATED    Report Status 09/20/2016 FINAL  Final    Radiology Studies: No results found.  Scheduled Meds: . acetaminophen  500 mg Oral Q8H  . calcitonin (salmon)  1 spray Alternating Nares Daily  . cinacalcet  90 mg Oral Q supper  . darbepoetin (ARANESP) injection - DIALYSIS  60 mcg Intravenous Q Fri-HD  . diphenhydrAMINE  25 mg Oral Q M,W,F-HD  . diphenhydrAMINE   25 mg Intravenous Once  . enoxaparin (LOVENOX) injection  30 mg Subcutaneous Q24H  . feeding supplement (NEPRO CARB STEADY)  237 mL Oral TID WC  . gabapentin  100 mg Oral BID  . HYDROmorphone   Intravenous Q4H  . lanthanum  1,000 mg Oral TID WC  . LORazepam  1 mg Oral QHS  . methocarbamol  500 mg Oral Q8H  . multivitamin  1 tablet Oral QHS  . potassium chloride  20 mEq Oral Daily  . sertraline  100 mg Oral Daily  . sodium thiosulfate infusion for calciphylaxis  25 g Intravenous Q M,W,F-HD  . vancomycin  500 mg Intravenous Q M,W,F-HD   Continuous Infusions:    LOS: 7 days   Zannie Cove, MD [facility] Pager [phone]  If 7PM-7AM, please contact night-coverage [email or web address] Password [email or web address] 09/20/2016, 3:17 PM

## 2016-09-20 NOTE — Progress Notes (Addendum)
[facility] Progress Note  Subjective  - POD #5  Pain with right thigh. Pain somewhat better with PCA.   Objective Vitals:   09/20/16 0744 09/20/16 0746  BP:    Pulse:    Resp: 10 11  Temp:      Intake/Output Summary (Last 24 hours) at 09/20/16 0842 Last data filed at 09/20/16 0600  Gross per 24 hour  Intake           249.62 ml  Output             3000 ml  Net         -2750.38 ml   Right thigh dressing clean. Was not taken down as it was changed this am.  Full thickness right shin ulcer. Necrotic wound to left thigh.    Assessment/Planning: 25 y.o. female is s/p: avg excision r thigh.  5 Days Post-Op   Continue xeroform to right thigh.  Wounds of right thigh, right shin and left thigh are likely calciphylaxis. Pathology right thigh showing necrotic skin.  Discussed that there is no good treatment for calciphylaxis. Dr. Randie Heinz to have discussion with nephrology about HD Palliative following.   Raymond Gurney 09/20/2016 8:42 AM --  Laboratory CBC    Component Value Date/Time   WBC 13.9 (H) 09/20/2016 0639   HGB 8.8 (L) 09/20/2016 0639   HCT 28.8 (L) 09/20/2016 0639   PLT 424 (H) 09/20/2016 0639    BMET    Component Value Date/Time   NA 142 09/19/2016 0420   K 3.8 09/19/2016 0420   CL 101 09/19/2016 0420   CO2 23 09/19/2016 0420   GLUCOSE 75 09/19/2016 0420   BUN 85 (H) 09/19/2016 0420   CREATININE 8.87 (H) 09/19/2016 0420   CALCIUM 8.0 (L) 09/19/2016 0420   GFRNONAA 6 (L) 09/19/2016 0420   GFRAA 6 (L) 09/19/2016 0420    COAG Lab Results  Component Value Date   INR 1.35 06/17/2016   INR 1.55 (H) 04/12/2016   INR 1.35 03/31/2016   No results found for: PTT  Antibiotics Anti-infectives    Start     Dose/Rate Route Frequency Ordered Stop   09/19/16 2000  vancomycin (VANCOCIN) 500 mg in sodium chloride 0.9 % 100 mL IVPB     500 mg 100 mL/hr over 60 Minutes Intravenous  Once 09/19/16 1939 09/19/16 2102   09/19/16 1130   vancomycin (VANCOCIN) 500 mg in sodium chloride 0.9 % 100 mL IVPB  Status:  Discontinued     500 mg 100 mL/hr over 60 Minutes Intravenous  Once 09/19/16 1132 09/19/16 1939   09/15/16 1200  vancomycin (VANCOCIN) 500 mg in sodium chloride 0.9 % 100 mL IVPB     500 mg 100 mL/hr over 60 Minutes Intravenous Every M-W-F (Hemodialysis) 09/14/16 0208     09/15/16 0854  vancomycin (VANCOCIN) 500-5 MG/100ML-% IVPB    Comments:  Matilde Haymaker, [place]   : cabinet override      09/15/16 0854 09/15/16 0940   09/14/16 1000  piperacillin-tazobactam (ZOSYN) IVPB 3.375 g  Status:  Discontinued     3.375 g 12.5 mL/hr over 240 Minutes Intravenous Every 12 hours 09/14/16 0208 09/19/16 1306   09/13/16 2245  vancomycin (VANCOCIN) IVPB 1000 mg/200 mL premix     1,000 mg 200 mL/hr over 60 Minutes Intravenous  Once 09/13/16 2235 09/13/16 2352   09/13/16 2245  piperacillin-tazobactam (ZOSYN) IVPB 3.375 g     3.375 g 100 mL/hr over 30 Minutes Intravenous  Once 09/13/16 2235 09/13/16 2324       Maris BergerKimberly Trinh, PA-C [facility] Office: [phone] Pager: [phone] 09/20/2016 8:42 AM  I have independently interviewed patient and agree with PA assessment and plan above. Wound is stable and wbc improved. Agree with treating for calciphylaxis despite negative biopsy. Consider ID input for antibiotics as nothing is grossly infected.   Arshdeep Bolger C. Randie Heinzain, MD [facility] [place] Office: [phone] Pager: [phone]

## 2016-09-20 NOTE — Progress Notes (Addendum)
Daily Progress Note   Patient Name: [name] Small       Date: 09/20/2016 DOB: 17-Oct-1991  Age: 25 y.o. MRN#: [id number] Attending Physician: Domenic Polite, MD Primary Care Physician: Angelica Chessman, MD Admit Date: 09/13/2016  Reason for Consultation/Follow-up: Establishing goals of care and Pain control  Subjective: Met with patient. Pain is much better controlled with PCA. Will consider transitioning to long acting po with breakthrough po tomorrow in anticipation of planning for discharge. Discussed GOC. [name] at times wants to stop dialysis. States she just wants to be normal again. She has tried to discuss this with her parents- her Mom is supportive and tells her it is her choice, however, her father pushes her to continue treatment. She is concerned about her boyfriend- states "it would be weird if he had a girlfriend who just up and died." She has decreased quality of life, now looking at prolonged intense pain from calciphylaxis. Discussed her disease trajectory, prognosis of ESRD w/ calciphylaxis and how care is primarily focused on pain management and preventing infection. Reviewed advance healthcare directives with her. She said she has completed paperwork in the past but doesn't know where it is. New paperwork given to her- she wants to complete it with her Mom present.     Review of Systems  Constitutional: Positive for malaise/fatigue and weight loss. Negative for fever.  Respiratory: Negative.   Cardiovascular: Negative.   Gastrointestinal: Negative.   Musculoskeletal: Positive for back pain, joint pain and myalgias.  Neurological: Positive for weakness.  Endo/Heme/Allergies: Negative.   Psychiatric/Behavioral: Positive for depression. The patient is nervous/anxious.   All  other systems reviewed and are negative.   Length of Stay: 7  Current Medications: Scheduled Meds:  . acetaminophen  500 mg Oral Q8H  . calcitonin (salmon)  1 spray Alternating Nares Daily  . cinacalcet  90 mg Oral Q supper  . darbepoetin (ARANESP) injection - DIALYSIS  60 mcg Intravenous Q Fri-HD  . diphenhydrAMINE  25 mg Oral Q M,W,F-HD  . enoxaparin (LOVENOX) injection  30 mg Subcutaneous Q24H  . feeding supplement (NEPRO CARB STEADY)  237 mL Oral TID WC  . gabapentin  100 mg Oral BID  . HYDROmorphone   Intravenous Q4H  . lanthanum  1,000 mg Oral TID WC  . LORazepam  1 mg Oral QHS  .  methocarbamol  500 mg Oral Q8H  . multivitamin  1 tablet Oral QHS  . potassium chloride  20 mEq Oral Daily  . sertraline  100 mg Oral Daily  . sodium thiosulfate infusion for calciphylaxis  25 g Intravenous Q M,W,F-HD  . vancomycin  500 mg Intravenous Q M,W,F-HD    Continuous Infusions:   PRN Meds: acetaminophen, diphenhydrAMINE **OR** diphenhydrAMINE (BENADRYL) IVPB(SICKLE CELL ONLY), famotidine, HYDROmorphone (DILAUDID) injection, naloxone **AND** sodium chloride flush, ondansetron (ZOFRAN) IV  Physical Exam  Constitutional: She appears well-developed and well-nourished. She appears distressed.  Cardiovascular: Regular rhythm.   Tachycardic   Pulmonary/Chest: Effort normal and breath sounds normal.  Abdominal: Soft. Bowel sounds are normal.  Skin:  RLE with blistering draining wound under yellow dressing, rest of wound is covered with ACE bandage.  Psychiatric:  tearful            Vital Signs: BP 115/83 (BP Location: Right Arm)   Pulse 95   Temp 98.8 F (37.1 C) (Oral)   Resp 15   Ht '5\' 3"'  (1.6 m)   Wt 54.5 kg (120 lb 2.4 oz)   SpO2 98%   BMI 21.28 kg/m  SpO2: SpO2: 98 % O2 Device: O2 Device: Not Delivered O2 Flow Rate: O2 Flow Rate (L/min): 0 L/min  Intake/output summary:   Intake/Output Summary (Last 24 hours) at 09/20/16 1635 Last data filed at 09/20/16 1414  Gross  per 24 hour  Intake           249.62 ml  Output             4000 ml  Net         -3750.38 ml   LBM: Last BM Date: 09/20/16 Baseline Weight: Weight: 51.3 kg (113 lb) Most recent weight: Weight: 54.5 kg (120 lb 2.4 oz)       Palliative Assessment/Data: PPS: 50%   Flowsheet Rows   Flowsheet Row Most Recent Value  Intake Tab  Referral Department  Hospitalist  Unit at Time of Referral  Med/Surg Unit  Palliative Care Primary Diagnosis  Nephrology  Date Notified  09/14/16  Palliative Care Type  New Palliative care  Reason for referral  Clarify Goals of Care, Pain, Psychosocial or Spiritual support  Date of Admission  09/13/16  Date first seen by Palliative Care  09/16/16  # of days Palliative referral response time  2 Day(s)  # of days IP prior to Palliative referral  1  Clinical Assessment  Palliative Performance Scale Score  40%  Pain Max last 24 hours  10  Pain Min Last 24 hours  4  Dyspnea Max Last 24 Hours  0  Dyspnea Min Last 24 hours  0  Nausea Max Last 24 Hours  0  Nausea Min Last 24 Hours  0  Anxiety Max Last 24 Hours  5  Anxiety Min Last 24 Hours  2  Psychosocial & Spiritual Assessment  Palliative Care Outcomes  Patient/Family meeting held?  Yes  Who was at the meeting?  pt  Palliative Care Outcomes  Improved pain interventions, Provided advance care planning  Palliative Care follow-up planned  Yes, Facility      Patient Active Problem List   Diagnosis Date Noted  . Pain of right lower extremity   . Palliative care by specialist   . 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 Palliative care encounter   . Edema of right lower extremity 09/14/2016  . Leg edema, right 09/14/2016  . Cellulitis 09/13/2016  . Generalized anxiety disorder  09/05/2016  . Post-operative pain   . Labile blood pressure   . Closed nondisplaced fracture of pelvis ([place])   . Other osteoporosis with current pathological fracture   . Recurrent major depressive disorder, in partial remission ([place])   .  Acute blood loss anemia   . 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 Anemia of chronic disease   . H/O medication noncompliance   . 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 Acute on chronic systolic CHF (congestive heart failure) ([place]) 08/16/2016  . Noncompliance of patient with renal dialysis ([place]) 08/12/2016  . Hyperparathyroidism due to end stage renal disease on dialysis ([place]) 08/01/2016  . Osteoporosis 08/01/2016  . Thoracic compression fracture - T7, T8, T12 07/27/2016  . Acute pulmonary edema ([place]) 07/26/2016  . Essential hypertension 06/17/2016  . Depression with anxiety 06/17/2016  . Volume overload 04/14/2016  . Peripheral edema 04/14/2016  . NICM (nonischemic cardiomyopathy) ([place]) 04/14/2016  . Internal jugular (IJ) vein thromboembolism, acute ([place]) 04/14/2016  . Pericardial effusion 04/14/2016  . Chronic anticoagulation   . Hypertensive urgency 04/11/2016  . DVT (deep venous thrombosis), left 04/11/2016  . Cellulitis of left upper extremity 03/31/2016  . Chronic pain syndrome 02/29/2016  . Chronic systolic heart failure ([place]) 01/04/2016  . AICD (automatic cardioverter/defibrillator) present 01/04/2016  . 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 Pathologic pelvic fracture 01/04/2016  . Depression 01/04/2016  . Pain in the chest   . ESRD on dialysis ([place])   . Tachycardia   . Elevated serum hCG   . Chest pain 10/11/2015  . Fluid overload 10/10/2015  . Hypertension 10/10/2015  . Anemia of chronic renal failure, stage 5 (HCC) 10/04/2015  . Thrombocytopenia ([place]) 10/04/2015  . Chest pain of uncertain etiology 40/98/1191  . Chronically Elevated troponin 03/03/2015  . Hyperkalemia 02/21/2015  . Acute on chronic systolic heart failure ([place]) 02/21/2012  . Pulmonary infarct ([place]) 02/07/2012  . ESRD (end stage renal disease) on dialysis ([place]) 02/03/2012  . PE (pulmonary embolism) 02/03/2012    Palliative Care Assessment & Plan   Patient Profile: 25 y.o.  female  with past medical history of End-stage renal disease secondary to polycystic disease, status post transplant in 2008, transplant failure in 2012, nephrectomy in 2012, now on hemodialysis, sickle cell anemia, chronic systolic heart failure with AICD placement (EF September 2000 1712%), PE DVT, opioid usage since age 73, noncompliance with hemodialysis and pain medicine admitted on 09/13/2016 with increased edema, and right lower extremity swelling and pain. Patient has been in the hospital 13 times in 2017. Patient has now acute on chronic pain secondary to recent compression fractures at levels T7, T8, T12 as well as pelvis. Patient also went to the OR on 09/15/2016 for skin graft revision on the right. She is verbalizing back pain for the past 2 months and severe right leg pain 1 week. She states the pain is been so severe that she is only walking a few steps in her apartment. Her boyfriend comes over and does her dishes and cooking. She does not have a car and this is made it difficult for her to be compliant with some of her appointments as well as her worsening pain. She states her parents are very actively involved in her life and she describes a close relationship with them. She states her mother does all of her grocery shopping for her.   Assessment/Recommendations/Plan   Pain- better with PCA, will continue PCA for now with goal to begin transition  to oral medication tomorrow; will also start lidocaine gel prn per [place] RN recommendations  GOC- her current GOC is to stabilize enough to return home. She would like palliative f/u in community for symptom management and continued [place] discussion. I am attempting to arrange a meeting with her Mom and Dad present to further discuss [place]. She does express that she has thoughts of discontinuing dialysis, especially no in light of her calciphylaxis diagnosis. I gave her Advance Directives paperwork and "Hard Choices for Loving People" book to  review.  Code status- I plan to discuss this specifically with her tomorrow.  Goals of Care and Additional Recommendations:  Limitations on Scope of Treatment: Full Scope Treatment  Code Status:  Full code  Prognosis:   < 3 months based on ESRD with recent calciphylaxis diagnosis  Discharge Planning:  Home with Palliative Services  Care plan was discussed with patient and patient's RN.   Thank you for allowing the Palliative Medicine Team to assist in the care of this patient.   Time In: 1600 Time Out: 1630 Total Time 30 mins Prolonged Time Billed No      Greater than 50%  of this time was spent counseling and coordinating care related to the above assessment and plan.  Mariana Kaufman, AGNP-C Palliative Medicine   Please contact Palliative Medicine Team phone at [phone] for questions and concerns.

## 2016-09-21 LAB — RENAL FUNCTION PANEL
ANION GAP: 17 — AB (ref 5–15)
Albumin: 2.2 g/dL — ABNORMAL LOW (ref 3.5–5.0)
BUN: 20 mg/dL (ref 6–20)
CHLORIDE: 97 mmol/L — AB (ref 101–111)
CO2: 24 mmol/L (ref 22–32)
Calcium: 7.3 mg/dL — ABNORMAL LOW (ref 8.9–10.3)
Creatinine, Ser: 3.68 mg/dL — ABNORMAL HIGH (ref 0.44–1.00)
GFR calc non Af Amer: 16 mL/min — ABNORMAL LOW (ref >60)
GFR, EST AFRICAN AMERICAN: 19 mL/min — AB (ref >60)
Glucose, Bld: 85 mg/dL (ref 65–99)
Phosphorus: 1.9 mg/dL — ABNORMAL LOW (ref 2.5–4.6)
Potassium: 3.8 mmol/L (ref 3.5–5.1)
Sodium: 138 mmol/L (ref 135–145)

## 2016-09-21 LAB — CBC
HCT: 31.2 % — ABNORMAL LOW (ref 36.0–46.0)
HEMOGLOBIN: 9.6 g/dL — AB (ref 12.0–15.0)
MCH: 27.1 pg (ref 26.0–34.0)
MCHC: 30.8 g/dL (ref 30.0–36.0)
MCV: 88.1 fL (ref 78.0–100.0)
PLATELETS: 419 10*3/uL — AB (ref 150–400)
RBC: 3.54 MIL/uL — AB (ref 3.87–5.11)
RDW: 22.3 % — ABNORMAL HIGH (ref 11.5–15.5)
WBC: 15.1 10*3/uL — ABNORMAL HIGH (ref 4.0–10.5)

## 2016-09-21 MED ORDER — FENTANYL 12 MCG/HR TD PT72
25.0000 ug | MEDICATED_PATCH | TRANSDERMAL | Status: DC
  Administered 2016-09-21: 25 ug via TRANSDERMAL
  Filled 2016-09-21: qty 2

## 2016-09-21 MED ORDER — HYDROMORPHONE HCL 2 MG PO TABS
4.0000 mg | ORAL_TABLET | ORAL | Status: DC | PRN
  Administered 2016-09-22: 4 mg via ORAL
  Filled 2016-09-21: qty 2

## 2016-09-21 MED ORDER — LIDOCAINE 5 % EX OINT
TOPICAL_OINTMENT | Freq: Three times a day (TID) | CUTANEOUS | Status: DC
  Administered 2016-09-22 – 2016-09-27 (×9): via TOPICAL
  Filled 2016-09-21: qty 35.44

## 2016-09-21 NOTE — Progress Notes (Signed)
Daily Progress Note   Patient Name: [name] Small       Date: 09/21/2016 DOB: 11-18-1990  Age: 25 y.o. MRN#: [id number] Attending Physician: Domenic Polite, MD Primary Care Physician: Angelica Chessman, MD Admit Date: 09/13/2016  Reason for Consultation/Follow-up: Establishing goals of care and Pain control  Subjective: Met briefly with patient. Pain is improved with PCA. She wants to stabilize and return home, agrees to palliative followup at home. Family meeting planned for tomorrow at 11am.     Review of Systems  Constitutional: Positive for malaise/fatigue and weight loss. Negative for fever.  Respiratory: Positive for shortness of breath.   Cardiovascular: Negative.   Gastrointestinal: Negative.   Musculoskeletal: Positive for back pain, joint pain and myalgias.  Neurological: Positive for weakness.  Endo/Heme/Allergies: Negative.   Psychiatric/Behavioral: Positive for depression. The patient is nervous/anxious.   All other systems reviewed and are negative.   Length of Stay: 8  Current Medications: Scheduled Meds:  . acetaminophen  500 mg Oral Q8H  . calcitonin (salmon)  1 spray Alternating Nares Daily  . cinacalcet  90 mg Oral Q supper  . darbepoetin (ARANESP) injection - DIALYSIS  60 mcg Intravenous Q Fri-HD  . diphenhydrAMINE  25 mg Oral Q M,W,F-HD  . enoxaparin (LOVENOX) injection  30 mg Subcutaneous Q24H  . feeding supplement (NEPRO CARB STEADY)  237 mL Oral TID WC  . fentaNYL  25 mcg Transdermal Q72H  . gabapentin  100 mg Oral BID  . HYDROmorphone   Intravenous Q4H  . lidocaine   Topical TID  . LORazepam  1 mg Oral QHS  . methocarbamol  500 mg Oral Q8H  . multivitamin  1 tablet Oral QHS  . potassium chloride  20 mEq Oral Daily  . sertraline  100 mg Oral  Daily  . sodium thiosulfate infusion for calciphylaxis  25 g Intravenous Q M,W,F-HD  . vancomycin  500 mg Intravenous Q M,W,F-HD    Continuous Infusions:   PRN Meds: acetaminophen, diphenhydrAMINE **OR** diphenhydrAMINE (BENADRYL) IVPB(SICKLE CELL ONLY), famotidine, HYDROmorphone (DILAUDID) injection, HYDROmorphone, naloxone **AND** sodium chloride flush, ondansetron (ZOFRAN) IV  Physical Exam  Constitutional: She appears well-developed.  Cardiovascular: Regular rhythm.   Tachycardic   Pulmonary/Chest: Effort normal and breath sounds normal.  Abdominal: Soft. Bowel sounds are normal.  Skin:  RLE wrapped in ace bandage, foam  dressing in place over L shin  Psychiatric:  tearful            Vital Signs: BP 114/83 (BP Location: Right Arm)   Pulse (!) 102   Temp 98 F (36.7 C) (Oral)   Resp 18   Ht _0  (1.6 m)   Wt 55.3 kg (121 lb 14.6 oz)   SpO2 100%   BMI 21.60 kg/m  SpO2: SpO2: 100 % O2 Device: O2 Device: Not Delivered O2 Flow Rate: O2 Flow Rate (L/min): 0 L/min  Intake/output summary:   Intake/Output Summary (Last 24 hours) at 09/21/16 1045 Last data filed at 09/21/16 0900  Gross per 24 hour  Intake              720 ml  Output             4000 ml  Net            -3280 ml   LBM: Last BM Date: 09/20/16 Baseline Weight: Weight: 51.3 kg (113 lb) Most recent weight: Weight: 55.3 kg (121 lb 14.6 oz)       Palliative Assessment/Data: PPS: 50%   Flowsheet Rows   Flowsheet Row Most Recent Value  Intake Tab  Referral Department  Hospitalist  Unit at Time of Referral  Med/Surg Unit  Palliative Care Primary Diagnosis  Nephrology  Date Notified  09/14/16  Palliative Care Type  New Palliative care  Reason for referral  Clarify Goals of Care, Pain, Psychosocial or Spiritual support  Date of Admission  09/13/16  Date first seen by Palliative Care  09/16/16  # of days Palliative referral response time  2 Day(s)  # of days IP prior to Palliative referral  1    Clinical Assessment  Palliative Performance Scale Score  40%  Pain Max last 24 hours  10  Pain Min Last 24 hours  4  Dyspnea Max Last 24 Hours  0  Dyspnea Min Last 24 hours  0  Nausea Max Last 24 Hours  0  Nausea Min Last 24 Hours  0  Anxiety Max Last 24 Hours  5  Anxiety Min Last 24 Hours  2  Psychosocial & Spiritual Assessment  Palliative Care Outcomes  Patient/Family meeting held?  Yes  Who was at the meeting?  pt  Palliative Care Outcomes  Improved pain interventions, Provided advance care planning  Palliative Care follow-up planned  Yes, Facility      Patient Active Problem List   Diagnosis Date Noted  . Pain of right lower extremity   . Palliative care by specialist   . Advance care planning   . Goals of care, counseling/discussion   . 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 Palliative care encounter   . Edema of right lower extremity 09/14/2016  . Leg edema, right 09/14/2016  . Cellulitis 09/13/2016  . Generalized anxiety disorder 09/05/2016  . Post-operative pain   . Labile blood pressure   . Closed nondisplaced fracture of pelvis ([place])   . Other osteoporosis with current pathological fracture   . Recurrent major depressive disorder, in partial remission ([place])   . Acute blood loss anemia   . 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 Anemia of chronic disease   . H/O medication noncompliance   . 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 Acute on chronic systolic CHF (congestive heart failure) ([place]) 08/16/2016  . Noncompliance of patient with renal dialysis ([place]) 08/12/2016  . Hyperparathyroidism due to end stage renal disease on  dialysis ([place]) 08/01/2016  . Osteoporosis 08/01/2016  . Thoracic compression fracture - T7, T8, T12 07/27/2016  . Acute pulmonary edema ([place]) 07/26/2016  . Essential hypertension 06/17/2016  . Depression with anxiety 06/17/2016  . Volume overload 04/14/2016  . Peripheral edema 04/14/2016  . NICM (nonischemic cardiomyopathy) ([place])  04/14/2016  . Internal jugular (IJ) vein thromboembolism, acute ([place]) 04/14/2016  . Pericardial effusion 04/14/2016  . Chronic anticoagulation   . Hypertensive urgency 04/11/2016  . DVT (deep venous thrombosis), left 04/11/2016  . Cellulitis of left upper extremity 03/31/2016  . Chronic pain syndrome 02/29/2016  . Chronic systolic heart failure ([place]) 01/04/2016  . AICD (automatic cardioverter/defibrillator) present 01/04/2016  . 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 Pathologic pelvic fracture 01/04/2016  . Depression 01/04/2016  . Pain in the chest   . ESRD on dialysis ([place])   . Tachycardia   . Elevated serum hCG   . Chest pain 10/11/2015  . Fluid overload 10/10/2015  . Hypertension 10/10/2015  . Anemia of chronic renal failure, stage 5 (HCC) 10/04/2015  . Thrombocytopenia ([place]) 10/04/2015  . Chest pain of uncertain etiology 87/56/4332  . Chronically Elevated troponin 03/03/2015  . Hyperkalemia 02/21/2015  . Acute on chronic systolic heart failure ([place]) 02/21/2012  . Pulmonary infarct ([place]) 02/07/2012  . ESRD (end stage renal disease) on dialysis ([place]) 02/03/2012  . PE (pulmonary embolism) 02/03/2012    Palliative Care Assessment & Plan   Patient Profile: 25 y.o. female  with past medical history of End-stage renal disease secondary to polycystic disease, status post transplant in 2008, transplant failure in 2012, nephrectomy in 2012, now on hemodialysis, sickle cell anemia, chronic systolic heart failure with AICD placement (EF September 2000 1712%), PE DVT, opioid usage since age 55, noncompliance with hemodialysis and pain medicine admitted on 09/13/2016 with increased edema, and right lower extremity swelling and pain. Patient has been in the hospital 13 times in 2017. Patient has now acute on chronic pain secondary to recent compression fractures at levels T7, T8, T12 as well as pelvis. Patient also went to the OR on 09/15/2016 for skin graft revision on the right. She is  verbalizing back pain for the past 2 months and severe right leg pain 1 week. She states the pain is been so severe that she is only walking a few steps in her apartment. Her boyfriend comes over and does her dishes and cooking. She does not have a car and this is made it difficult for her to be compliant with some of her appointments as well as her worsening pain. She states her parents are very actively involved in her life and she describes a close relationship with them. She states her mother does all of her grocery shopping for her.   Assessment/Recommendations/Plan   Pain- based on patient 24 hr requirements of 92m hydromorphone will start 226m/hr fentanyl patch. Continue PCA for 12 hours to bridge pain control until patch can reach steady state. Then start po hydromorphone 66m99m 3 hours for breakthrough pain. Scheduled topical lidocaine gel to painful areas TID   GOC- her current GOC is to stabilize enough to return home. She is starting to come to terms with the fact that she has a terminal illness and her life is limited. Family meeting planned for tomorrow 12/1 at [address] to facilitate discussion re: patient's prognosis, code status, and GOC. She is likely eligible for hospice services.   Goals of Care and Additional Recommendations:  Limitations on Scope  of Treatment: Full Scope Treatment  Code Status:  Full code  Prognosis:   < 3 months based on ESRD with recent calciphylaxis diagnosis  Discharge Planning:  Home with Palliative Services  Care plan was discussed with patient and patient's RN.   Thank you for allowing the Palliative Medicine Team to assist in the care of this patient.   Time In: 0900 Time Out: 0930 Total Time 30 mins Prolonged Time Billed No      Greater than 50%  of this time was spent counseling and coordinating care related to the above assessment and plan.  Mariana Kaufman, AGNP-C Palliative Medicine   Please contact Palliative  Medicine Team phone at [phone] for questions and concerns.

## 2016-09-21 NOTE — Progress Notes (Signed)
[facility]  Subjective  - feels a little better   Objective 104/74 (!) 103 98.6 F (37 C) (Oral) 13 100%  Intake/Output Summary (Last 24 hours) at 09/21/16 0812 Last data filed at 09/21/16 0602  Gross per 24 hour  Intake              480 ml  Output             4000 ml  Net            -3520 ml   Right groin incision healing no drainage or erythema Pain controlled on dilaudid PCA  Assessment/Planning: Will have PA see later to change thigh dressing  No plans for further intervention at this point Leukocytosis continues to increase  Fabienne Bruns 09/21/2016 8:12 AM --  Laboratory Lab Results:  Recent Labs  09/20/16 0639 09/21/16 0616  WBC 13.9* 15.1*  HGB 8.8* 9.6*  HCT 28.8* 31.2*  PLT 424* 419*   BMET  Recent Labs  09/20/16 0639 09/21/16 0616  NA 142 138  K 4.9 3.8  CL 102 97*  CO2 18* 24  GLUCOSE 73 85  BUN 41* 20  CREATININE 5.90* 3.68*  CALCIUM 8.0* 7.3*    COAG Lab Results  Component Value Date   INR 1.35 06/17/2016   INR 1.55 (H) 04/12/2016   INR 1.35 03/31/2016   No results found for: PTT

## 2016-09-21 NOTE — Progress Notes (Signed)
  [facility] Progress Note  Changed right thigh dressing this am. Right groin incision clean and healing well. Distal thigh incision open with fibrinous exudative tissue. No purulence seen. No evidence of infection. Black eschar and blisters to lateral thigh and medial thigh.   Xeroform gauze reapplied to these areas. Continue daily wound care. Lidocaine ointment to shin wounds for pain per WOC recs.   Maris Berger, PA-C [facility] Office: [phone] Pager: [phone] 09/21/2016 9:46 AM

## 2016-09-21 NOTE — Progress Notes (Signed)
[facility] Progress Note  Dialysis Orders: MWF 4h Opiflux 180 BFR 400/800 2K/2Ca EDW 49.5kg left fem TDC Heparin 2000 U IV Bolus q HD  Hectorol 1mcg IV q HD  Aranesp 60 mcg IV q week (not given yet)  Assessment: 1. Right LE edema/ulceration -s/p removal of right thigh graft 11/24 due to blistering and skin necrosis/ eschar which developed 1-2 weeks after AVG placement. No pus in OR report and minimal fevers here. 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 Started on Na thiosulfate  With - reaction she felt during tmt could be related - she had a similar reaction again on Wed but said she was able to make it through her treatment.  I discussed this with the RN who did her treatment and she slept through her treatment with no evidence of any reaction to the Na thiosulfate 2.  ESRD-MWF - had Monday HD Tuesday due to high patient volume - back on schedule Wed K supplements given to keep K up during treatment. K 4.9  Using 2 K 2 Ca bath. Decreased K to 20 meq per day. K 3.9 post HD -On regular diet. Benadryl benadryl 25 mg po given with HD to help with itching. 3. Anemia- hgb 8.8 pre HD Wed and 9.6 Thursday -Aranesp 60 given 11/24; last tsat 11% 10/27 - check Fe studies Friday  4. Leg wounds/ calciphylaxis - severely painful gangrenous skin changes bilat calves, clinical picture c/w calciphylaxis. Started Na thio TIW with HD, hold all vit D/ Ca products. Cont sensipar and non Ca binders. Use low Ca bath so will need K+supplements. Goal corr Ca low range of normal ~8-8.5. 5. Sec HPTH- ipTH 2 ,569 On 08/30/16 - P now 1.9 will hold fosrenol. Liberalized diet so there may be more P; keep same fosrenol dose Have stopped VDRA; on sensipar 90 q supper  /2 Ca bath/calcitonin q day  6. Hypotension/volume- net UF 3 L Tuesday post weight 51.4- Net UF 4 L on Wed with post wt not done - pre weight was 54.5 - so  still not to edw -  7. Nutrition- alb 2.5, liberalized diet to ^ K 8. Symptom management- Palliative care has seen with recommendations for care outlined. Appreciate their involvement. Discussed transitioning to PO meds 9. NICM s/p AICD - EF 20-25% 06/19/2016  Sheffield SliderMartha B Bergman, PA-C [facility] Beeper [phone] 09/21/2016,10:23 AM  LOS: 8 days   Pt seen, examined and agree w A/P as above.  Vinson Moselleob Jedediah Noda MD [facility] pager [phone]   09/21/2016, 12:13 PM    Subjective:   Still felt funny during treatment yesterday during "that infusion". PCA locked out due to registered dim respirations. Pt alert at present - wants more pain meds.  Objective Vitals:   09/21/16 0126 09/21/16 0400 09/21/16 0517 09/21/16 0518  BP:    104/74  Pulse:    (!) 103  Resp: 15 13 13 13   Temp:    98.6 F (37 C)  TempSrc:    Oral  SpO2: 97% 100% 97% 100%  Weight:      Height:       Physical Exam General: NAD Heart: RRR Lungs: no rales Abdomen:soft Extremities: no sig LE edema; still tender to slight touch; scattered areas dressed with cushion pads/right thigh wrapped. Dialysis Access: left fem cath   Additional Objective Labs: Basic Metabolic Panel:  Recent Labs Lab 09/19/16 0420 09/20/16 0639 09/21/16  0616  NA 142 142 138  K 3.8 4.9 3.8  CL 101 102 97*  CO2 23 18* 24  GLUCOSE 75 73 85  BUN 85* 41* 20  CREATININE 8.87* 5.90* 3.68*  CALCIUM 8.0* 8.0* 7.3*  PHOS 3.3 2.5 1.9*   Liver Function Tests:  Recent Labs Lab 09/19/16 0420 09/20/16 0639 09/21/16 0616  ALBUMIN 2.2* 2.4* 2.2*   No results for input(s): LIPASE, AMYLASE in the last 168 hours. CBC:  Recent Labs Lab 09/15/16 0353 09/17/16 0738 09/18/16 0636 09/19/16 0420 09/20/16 0639 09/21/16 0616  WBC 24.4* 14.0* 13.7* 15.7* 13.9* 15.1*  NEUTROABS 22.2*  --  10.8*  --   --   --   HGB 8.9* 9.3* 9.3* 9.3* 8.8* 9.6*  HCT 29.8* 31.3* 30.7* 30.3* 28.8* 31.2*  MCV 90.6 90.7 89.8 89.9  87.8 88.1  PLT 376 364 380 418* 424* 419*   Blood Culture    Component Value Date/Time   SDES WOUND RIGHT THIGH 09/15/2016 1734   SPECREQUEST PATIENT ON FOLLOWING ZOSYN NO ANAEROBIC SWAB SENT 09/15/2016 1734   CULT  09/15/2016 1734    RARE STAPHYLOCOCCUS SPECIES (COAGULASE NEGATIVE) CALL MICROBIOLOGY LAB IF SENSITIVITIES ARE REQUIRED. NO ANAEROBES ISOLATED    REPTSTATUS 09/20/2016 FINAL 09/15/2016 1734    Cardiac Enzymes: No results for input(s): CKTOTAL, CKMB, CKMBINDEX, TROPONINI in the last 168 hours. CBG:  Recent Labs Lab 09/17/16 0000 09/17/16 0801 09/17/16 1207 09/17/16 1649 09/17/16 2030  GLUCAP 87 126* 106* 99 90  Medications:  . acetaminophen  500 mg Oral Q8H  . calcitonin (salmon)  1 spray Alternating Nares Daily  . cinacalcet  90 mg Oral Q supper  . darbepoetin (ARANESP) injection - DIALYSIS  60 mcg Intravenous Q Fri-HD  . diphenhydrAMINE  25 mg Oral Q M,W,F-HD  . enoxaparin (LOVENOX) injection  30 mg Subcutaneous Q24H  . feeding supplement (NEPRO CARB STEADY)  237 mL Oral TID WC  . gabapentin  100 mg Oral BID  . HYDROmorphone   Intravenous Q4H  . lanthanum  1,000 mg Oral TID WC  . LORazepam  1 mg Oral QHS  . methocarbamol  500 mg Oral Q8H  . multivitamin  1 tablet Oral QHS  . potassium chloride  20 mEq Oral Daily  . sertraline  100 mg Oral Daily  . sodium thiosulfate infusion for calciphylaxis  25 g Intravenous Q M,W,F-HD  . vancomycin  500 mg Intravenous Q M,W,F-HD

## 2016-09-21 NOTE — Evaluation (Signed)
Physical Therapy Evaluation Patient Details Name: [name] SorrowVictoria Lykens MRN: [id number] DOB: 28-Mar-1991 Today's Date: 09/21/2016   History of Present Illness  [name] SorrowVictoria Wion is a 25 y.o. female with ESRD secondary to polycystic kidney disease, chronic systolic heart failure status post AICD placement last EF measured in September 2017 was 12% and recently has had T-spine fractures presents to the ER because of increasing swelling of the right lower extremity with ulcerations and blisters.   Clinical Impression  Pt admitted with/for R LE swelling with ulcerations and blisters..  Pt currently limited functionally due to the problems listed below.  (see problems list.)  Pt will benefit from PT to maximize function and safety to be able to get home safely with available assist.     Follow Up Recommendations Home health PT;Supervision - Intermittent    Equipment Recommendations       Recommendations for Other Services       Precautions / Restrictions Precautions Precautions: Fall;Back Required Braces or Orthoses: Spinal Brace Spinal Brace: Thoracolumbosacral orthotic      Mobility  Bed Mobility Overal bed mobility: Needs Assistance Bed Mobility: Supine to Sit     Supine to sit: Min assist     General bed mobility comments: pt needed assist with her legs.  Transfers                 General transfer comment: pt declined getting OOB  Ambulation/Gait                Stairs            Wheelchair Mobility    Modified Rankin (Stroke Patients Only)       Balance Overall balance assessment: Needs assistance   Sitting balance-Leahy Scale: Fair                                       Pertinent Vitals/Pain Pain Assessment: Faces Faces Pain Scale: Hurts whole lot Pain Location: both LE's R> L Pain Descriptors / Indicators: Constant;Discomfort Pain Intervention(s): Monitored during session;Repositioned;Limited activity within patient's  tolerance    Home Living Family/patient expects to be discharged to:: Private residence Living Arrangements: Alone Available Help at Discharge: Available PRN/intermittently;Friend(s);Family Type of Home: Apartment Home Access: Level entry     Home Layout: One level Home Equipment: Walker - 2 wheels;Bedside commode Additional Comments: Pt sponge bathing since she has a femoreal dialysis catheter    Prior Function Level of Independence: Independent with assistive device(s);Needs assistance   Gait / Transfers Assistance Needed: Requires use of RW for ambulation. Reports her dad had to carry her up the stairs to get into their home.     Comments: Pt uses [facility] transport to op dialysis     Hand Dominance   Dominant Hand: Right    Extremity/Trunk Assessment               Lower Extremity Assessment: Generalized weakness         Communication   Communication: No difficulties  Cognition Arousal/Alertness: Awake/alert Behavior During Therapy: WFL for tasks assessed/performed Overall Cognitive Status: Within Functional Limits for tasks assessed                      General Comments General comments (skin integrity, edema, etc.): pt too painful to be encouraged to get up to EOB or to get OOB    Exercises  Assessment/Plan    PT Assessment Patient needs continued PT services  PT Problem List Decreased strength;Decreased activity tolerance;Decreased balance;Decreased mobility;Decreased knowledge of use of DME;Pain          PT Treatment Interventions Gait training;Functional mobility training;Therapeutic activities;Balance training;Patient/family education    PT Goals (Current goals can be found in the Care Plan section)  Acute Rehab PT Goals Patient Stated Goal: get back home PT Goal Formulation: With patient Time For Goal Achievement: 10/05/16 Potential to Achieve Goals: Fair    Frequency Min 3X/week   Barriers to discharge Decreased caregiver  support      Co-evaluation               End of Session   Activity Tolerance: Patient limited by pain Patient left: in bed;with call bell/phone within reach;with bed alarm set Nurse Communication: Mobility status         Time: 1610-1630 PT Time Calculation (min) (ACUTE ONLY): 20 min   Charges:   PT Evaluation $PT Eval Moderate Complexity: 1 Procedure     PT G CodesEliseo Gum Maxima Skelton 09/21/2016, 4:55 PM 09/21/2016  Dayton Bing, PT [phone]  (pager)

## 2016-09-21 NOTE — Care Management Note (Signed)
Case Management Note  Patient Details  Name: Janiyah Harper MRN: [id number] Date of Birth: 1991-01-06  Subjective/Objective:     CM following for progression and d/c planning. Consult for Palliative referral.               Action/Plan: 09/21/2016 Palliative for [facility] notified of pt need for Palliative followup in the home post d/c.  Expected Discharge Date:                  Expected Discharge Plan:  Home w Home Health Services  In-House Referral:  Clinical Social Work  Discharge planning Services  CM Consult  Post Acute Care Choice:  NA Choice offered to:  NA  DME Arranged:    DME Agency:     HH Arranged:  RN, PT, Nurse's Aide HH Agency:  [facility]  Status of Service:  Completed, signed off  If discussed at Long Length of Stay Meetings, dates discussed:    Additional Comments:  Starlyn Skeans, RN 09/21/2016, 12:41 PM

## 2016-09-21 NOTE — Progress Notes (Addendum)
PROGRESS NOTE [facility]   [place]   LNL:[id number] DOB: Dec 02, 1990  DOA: 09/13/2016 PCP: Jeanann Lewandowsky, MD   Brief Narrative:  [name] Small a 25 y.o. 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 Recently hospitalized from 10/22-11/10/17 with T7,T8,T12 vertebral compression fractures and volume overload related to noncompliance with HD. She had a new right femoral AVG placed during admission on 08/31/16. Graft has not been used for HD.  Following her most recent discharge she transferred to [facility] for HD. Her last HD was on 11/20 for 3 hours 27 mins. Presented to the ER because of increasing swelling of the right lower extremity with ulcerations and blisters. CT scan was done which was showing diffuse edema. Patient has had recent AV graft placed on right lower extremity and gets her dialysis done on the left lower extremity dialysis catheter. On exam patient has eschar over the recent AV graft placement on the right lower extremity with blisters in the lower extremity both left and right. There is diffuse edema of the right lower extremity.  Subjective: Pain better on Dilaudid PCA, understands that overall prognosis is poor  Assessment & Plan:   Right lower extremity edema with blisters and ulceration/infected AV graft Right thigh - now with Calciphylaxis;  s/p graft removal and biopsy - Vascular surgery following - Poorly healing right thigh AVG with skin necrosis   - Cultures no growth to date  - on IV empiric antibiotics per surgery - overall poor prognosis - Palliative care consult appreciated, PCA started 11/28  Hypotension - borderline low  -soft, monitor  ESRD on  hemodialysis TTS - Electrolyte abnormalities  -Renal recommendations appreciated -HD per Renal  Nonischemic cardiomyopathy S/P AICD placement  -EF 06/2016 =12% -volume managed with HD  T7,T8,T12 vertebral compression fractures -supportive care, PCA pump per palliative care on 11/28  Chronic anemia - hemoglobin stable at baseline -stable  DVT prophylaxis: Pt refusing all heparin injections: switched to lovenox 11/27 and still refusing, counseled patient about risks including death Code Status: Full Family Communication: none at bedside Disposition Plan: home when stable  Consultants:   Vascular surgery Dr. Randie Heinz  Nephrology Dr. Briant Cedar  Procedures:  11/22 CT bilateral lower extremity:-No evidence of fracture or dislocation. -. Diffuse soft tissue edema involving right hip/entirety of right lower extremity, with large 5.4 cm fluid-filled blister along the anterolateral right lower leg. Multiple smaller blisters along the medial right thigh. Negative abscess - Small bilateral knee joint effusions, Rt>> Lt   Antimicrobials:  Zosyn 11/22  Vancomycin 11/22  Objective: Vitals:   09/21/16 0517 09/21/16 0518 09/21/16 1000 09/21/16 1212  BP:  104/74 114/83   Pulse:  (!) 103 (!) 102   Resp: 13 13 18 14   Temp:  98.6 F (37 C) 98 F (36.7 C)   TempSrc:  Oral Oral   SpO2: 97% 100% 100% 100%  Weight:      Height:        Intake/Output Summary (Last 24 hours) at 09/21/16 1426 Last data filed at 09/21/16 0900  Gross per 24 hour  Intake              720 ml  Output                0 ml  Net  720 ml   Filed Weights   09/19/16 2152 09/20/16 1005 09/20/16 2120  Weight: 52.2 kg (115 lb 1.3 oz) 54.5 kg (120 lb 2.4 oz) 55.3 kg (121 lb 14.6 oz)   Examination:  General exam: AAOx3, chronically ill appearing Respiratory system: Clear to auscultation.  Cardiovascular system: S1 & S2 heard, RRR.  Extremities: Range of movement continues to improve. Right lower  extremity with edema decreasing. Penrose on R thigh    Skin: Blister in legs/ extremities. Fistula in the left arm Psychiatry: Withdrawn and flat affect  Data Reviewed: I have personally reviewed following labs and imaging studies  CBC:  Recent Labs Lab 09/15/16 0353 09/17/16 0738 09/18/16 0636 09/19/16 0420 09/20/16 0639 09/21/16 0616  WBC 24.4* 14.0* 13.7* 15.7* 13.9* 15.1*  NEUTROABS 22.2*  --  10.8*  --   --   --   HGB 8.9* 9.3* 9.3* 9.3* 8.8* 9.6*  HCT 29.8* 31.3* 30.7* 30.3* 28.8* 31.2*  MCV 90.6 90.7 89.8 89.9 87.8 88.1  PLT 376 364 380 418* 424* 419*   Basic Metabolic Panel:  Recent Labs Lab 09/14/16 1519  09/17/16 2309 09/18/16 0636 09/19/16 0420 09/20/16 0639 09/21/16 0616  NA  --   < > 140 142 142 142 138  K  --   < > 3.7 3.5 3.8 4.9 3.8  CL  --   < > 99* 99* 101 102 97*  CO2  --   < > 24 27 23  18* 24  GLUCOSE  --   < > 90 79 75 73 85  BUN  --   < > 62* 69* 85* 41* 20  CREATININE  --   < > 7.09* 7.45* 8.87* 5.90* 3.68*  CALCIUM  --   < > 7.9* 8.0* 8.0* 8.0* 7.3*  MG 2.2  --   --   --   --   --   --   PHOS  --   --   --   --  3.3 2.5 1.9*  < > = values in this interval not displayed. GFR: Estimated Creatinine Clearance: 19.3 mL/min (by C-G formula based on SCr of 3.68 mg/dL (H)). Liver Function Tests:  Recent Labs Lab 09/19/16 0420 09/20/16 0639 09/21/16 0616  ALBUMIN 2.2* 2.4* 2.2*   No results for input(s): LIPASE, AMYLASE in the last 168 hours. No results for input(s): AMMONIA in the last 168 hours. Coagulation Profile: No results for input(s): INR, PROTIME in the last 168 hours. Cardiac Enzymes: No results for input(s): CKTOTAL, CKMB, CKMBINDEX, TROPONINI in the last 168 hours. BNP (last 3 results) No results for input(s): PROBNP in the last 8760 hours. HbA1C: No results for input(s): HGBA1C in the last 72 hours. CBG:  Recent Labs Lab 09/17/16 0000 09/17/16 0801 09/17/16 1207 09/17/16 1649 09/17/16 2030  GLUCAP 87 126* 106* 99 90    Lipid Profile: No results for input(s): CHOL, HDL, LDLCALC, TRIG, CHOLHDL, LDLDIRECT in the last 72 hours. Thyroid Function Tests: No results for input(s): TSH, T4TOTAL, FREET4, T3FREE, THYROIDAB in the last 72 hours. Anemia Panel: No results for input(s): VITAMINB12, FOLATE, FERRITIN, TIBC, IRON, RETICCTPCT in the last 72 hours. Sepsis Labs:  Recent Labs Lab 09/15/16 0353  LATICACIDVEN 1.9    Recent Results (from the past 240 hour(s))  Culture, blood (routine x 2)     Status: None   Collection Time: 09/14/16  3:23 PM  Result Value Ref Range Status   Specimen Description BLOOD RIGHT HAND  Final   Special Requests  IN PEDIATRIC BOTTLE 2CC  Final   Culture NO GROWTH 5 DAYS  Final   Report Status 09/19/2016 FINAL  Final  Culture, blood (routine x 2)     Status: None   Collection Time: 09/14/16  3:34 PM  Result Value Ref Range Status   Specimen Description BLOOD RIGHT ARM  Final   Special Requests IN PEDIATRIC BOTTLE 2CC  Final   Culture NO GROWTH 5 DAYS  Final   Report Status 09/19/2016 FINAL  Final  Aerobic/Anaerobic Culture (surgical/deep wound)     Status: None   Collection Time: 09/15/16  5:34 PM  Result Value Ref Range Status   Specimen Description WOUND RIGHT THIGH  Final   Special Requests PATIENT ON FOLLOWING ZOSYN NO ANAEROBIC SWAB SENT  Final   Gram Stain NO WBC SEEN NO ORGANISMS SEEN   Final   Culture   Final    RARE STAPHYLOCOCCUS SPECIES (COAGULASE NEGATIVE) CALL MICROBIOLOGY LAB IF SENSITIVITIES ARE REQUIRED. NO ANAEROBES ISOLATED    Report Status 09/20/2016 FINAL  Final    Radiology Studies: No results found.  Scheduled Meds: . acetaminophen  500 mg Oral Q8H  . calcitonin (salmon)  1 spray Alternating Nares Daily  . cinacalcet  90 mg Oral Q supper  . darbepoetin (ARANESP) injection - DIALYSIS  60 mcg Intravenous Q Fri-HD  . diphenhydrAMINE  25 mg Oral Q M,W,F-HD  . enoxaparin (LOVENOX) injection  30 mg Subcutaneous Q24H  . feeding supplement (NEPRO  CARB STEADY)  237 mL Oral TID WC  . fentaNYL  25 mcg Transdermal Q72H  . gabapentin  100 mg Oral BID  . HYDROmorphone   Intravenous Q4H  . lidocaine   Topical TID  . LORazepam  1 mg Oral QHS  . methocarbamol  500 mg Oral Q8H  . multivitamin  1 tablet Oral QHS  . potassium chloride  20 mEq Oral Daily  . sertraline  100 mg Oral Daily  . sodium thiosulfate infusion for calciphylaxis  25 g Intravenous Q M,W,F-HD  . vancomycin  500 mg Intravenous Q M,W,F-HD   Continuous Infusions:    LOS: 8 days   Zannie CoveJOSEPH,Ingra Rother, MD [facility] Pager [phone]  If 7PM-7AM, please contact night-coverage [email or web address] Password [email or web address] 09/21/2016, 2:26 PM

## 2016-09-22 DIAGNOSIS — Z66 Do not resuscitate: Secondary | ICD-10-CM

## 2016-09-22 LAB — RENAL FUNCTION PANEL
Albumin: 2.2 g/dL — ABNORMAL LOW (ref 3.5–5.0)
Anion gap: 17 — ABNORMAL HIGH (ref 5–15)
BUN: 47 mg/dL — ABNORMAL HIGH (ref 6–20)
CO2: 21 mmol/L — ABNORMAL LOW (ref 22–32)
Calcium: 7.2 mg/dL — ABNORMAL LOW (ref 8.9–10.3)
Chloride: 106 mmol/L (ref 101–111)
Creatinine, Ser: 5.63 mg/dL — ABNORMAL HIGH (ref 0.44–1.00)
GFR calc Af Amer: 11 mL/min — ABNORMAL LOW (ref >60)
GFR calc non Af Amer: 10 mL/min — ABNORMAL LOW (ref >60)
Glucose, Bld: 119 mg/dL — ABNORMAL HIGH (ref 65–99)
Phosphorus: 2.8 mg/dL (ref 2.5–4.6)
Potassium: 4.9 mmol/L (ref 3.5–5.1)
Sodium: 144 mmol/L (ref 135–145)

## 2016-09-22 LAB — CBC
HCT: 28.3 % — ABNORMAL LOW (ref 36.0–46.0)
Hemoglobin: 8.8 g/dL — ABNORMAL LOW (ref 12.0–15.0)
MCH: 27.2 pg (ref 26.0–34.0)
MCHC: 31.1 g/dL (ref 30.0–36.0)
MCV: 87.6 fL (ref 78.0–100.0)
Platelets: 399 10*3/uL (ref 150–400)
RBC: 3.23 MIL/uL — ABNORMAL LOW (ref 3.87–5.11)
RDW: 22.8 % — ABNORMAL HIGH (ref 11.5–15.5)
WBC: 14.7 10*3/uL — ABNORMAL HIGH (ref 4.0–10.5)

## 2016-09-22 LAB — IRON AND TIBC
Iron: 37 ug/dL (ref 28–170)
SATURATION RATIOS: 28 % (ref 10.4–31.8)
TIBC: 134 ug/dL — ABNORMAL LOW (ref 250–450)
UIBC: 97 ug/dL

## 2016-09-22 LAB — FERRITIN: Ferritin: 816 ng/mL — ABNORMAL HIGH (ref 11–307)

## 2016-09-22 MED ORDER — HYDROMORPHONE HCL 1 MG/ML IJ SOLN
INTRAMUSCULAR | Status: AC
  Administered 2016-09-22: 1 mg via INTRAVENOUS
  Filled 2016-09-22: qty 1

## 2016-09-22 MED ORDER — HEPARIN SODIUM (PORCINE) 1000 UNIT/ML DIALYSIS: 1000.0000 [IU] | INTRAMUSCULAR | Status: DC | PRN

## 2016-09-22 MED ORDER — FENTANYL 12 MCG/HR TD PT72: 37.5000 ug | MEDICATED_PATCH | TRANSDERMAL | Status: DC

## 2016-09-22 MED ORDER — LIDOCAINE-PRILOCAINE 2.5-2.5 % EX CREA: 1.0000 "application " | TOPICAL_CREAM | CUTANEOUS | Status: DC | PRN

## 2016-09-22 MED ORDER — DARBEPOETIN ALFA 60 MCG/0.3ML IJ SOSY
PREFILLED_SYRINGE | INTRAMUSCULAR | Status: AC
  Administered 2016-09-22: 60 ug
  Filled 2016-09-22: qty 0.3

## 2016-09-22 MED ORDER — LIDOCAINE HCL (PF) 1 % IJ SOLN: 5.0000 mL | INTRAMUSCULAR | Status: DC | PRN

## 2016-09-22 MED ORDER — ALTEPLASE 2 MG IJ SOLR: 2.0000 mg | Freq: Once | INTRAMUSCULAR | Status: DC | PRN

## 2016-09-22 MED ORDER — SODIUM CHLORIDE 0.9 % IV SOLN: 100.0000 mL | INTRAVENOUS | Status: DC | PRN

## 2016-09-22 MED ORDER — GABAPENTIN 300 MG PO CAPS
300.0000 mg | ORAL_CAPSULE | Freq: Two times a day (BID) | ORAL | Status: DC
  Administered 2016-09-22 – 2016-09-27 (×10): 300 mg via ORAL
  Filled 2016-09-22 (×11): qty 1

## 2016-09-22 MED ORDER — VANCOMYCIN HCL IN DEXTROSE 500-5 MG/100ML-% IV SOLN
INTRAVENOUS | Status: AC
  Filled 2016-09-22: qty 100

## 2016-09-22 MED ORDER — GABAPENTIN 100 MG PO CAPS: 200.0000 mg | ORAL_CAPSULE | Freq: Two times a day (BID) | ORAL | Status: DC

## 2016-09-22 MED ORDER — HYDROMORPHONE HCL 1 MG/ML IJ SOLN
2.0000 mg | Freq: Once | INTRAMUSCULAR | Status: AC
  Administered 2016-09-22: 2 mg via INTRAVENOUS

## 2016-09-22 MED ORDER — HYDROMORPHONE HCL 1 MG/ML IJ SOLN
INTRAMUSCULAR | Status: AC
  Filled 2016-09-22: qty 2

## 2016-09-22 MED ORDER — PENTAFLUOROPROP-TETRAFLUOROETH EX AERO: 1.0000 "application " | INHALATION_SPRAY | CUTANEOUS | Status: DC | PRN

## 2016-09-22 MED ORDER — DIPHENHYDRAMINE HCL 25 MG PO CAPS
ORAL_CAPSULE | ORAL | Status: AC
  Administered 2016-09-22: 25 mg via ORAL
  Filled 2016-09-22: qty 1

## 2016-09-22 NOTE — Care Management Important Message (Signed)
Important Message  Patient Details  Name: Torey Chestnut MRN: [id number] Date of Birth: 1991/05/22   Medicare Important Message Given:  Yes    Adriann Thau Abena 09/22/2016, 1:18 PM

## 2016-09-22 NOTE — Progress Notes (Signed)
   09/22/16 1400  Clinical Encounter Type  Visited With Patient  Visit Type Follow-up  Referral From Chaplain  Consult/Referral To Chaplain  Recommendations (follow-up to complete AD)  Spiritual Encounters  Spiritual Needs Literature  Pt. Is not available, no family at the moment, pt is in dialysis for the next hour.  Will follow-up to complete.  [employer]   [phone]

## 2016-09-22 NOTE — Progress Notes (Signed)
Pharmacy Antibiotic Note  [name] Small is a 25 y.o. female admitted on 09/13/2016 with cellulitis.  Pharmacy has been consulted for Vancomycin dosing.   Today is D#10 of antibiotics for right lower extremity cellulitis with blisters/eschar over site of recent AV graft placement.  Edema improving, cultures negative. Treating for calciphylaxis as well.  Discussed with Dr. Jomarie Longs this morning, and she has attempted to contact vascular in regards to stopping antibiotics- no mention of antibiotic plan in Dr. Darcella Cheshire note from today. I discussed with Dr. Randie Heinz on 11/29 and he mentioned possible ID involvement, but consult has not been placed.  Plan: -Vancomycin 500 mg/HD-MWF -Follow HD schedule/tolerance, any new cultures -Please address antibiotic LOT plans   Height: 5\' 3"  (160 cm) Weight: 117 lb 11.6 oz (53.4 kg) IBW/kg (Calculated) : 52.4  Temp (24hrs), Avg:98.5 F (36.9 C), Min:98 F (36.7 C), Max:98.9 F (37.2 C)   Recent Labs Lab 09/17/16 2309 09/18/16 0636 09/19/16 0420 09/20/16 0639 09/21/16 0616 09/22/16 1330  WBC  --  13.7* 15.7* 13.9* 15.1* 14.7*  CREATININE 7.09* 7.45* 8.87* 5.90* 3.68*  --     Estimated Creatinine Clearance: 19.3 mL/min (by C-G formula based on SCr of 3.68 mg/dL (H)).    Allergies  Allergen Reactions  . Aspirin Other (See Comments)    Interacts with Coreg  . Tramadol Anaphylaxis  . Vicodin [Hydrocodone-Acetaminophen] Hives  . Buprenorphine Hcl Itching    Ok with oxycodone  . Iohexol Itching  . Morphine And Related Itching and Other (See Comments)    Ok with oxycodone    Antimicrobials this admission:  Vancomycin 11/23 >> Zosyn 11/23 >> 11/28  Dose adjustments this admission:  -Did not receive HD 11/27 and went 11/28 AM - Vanc dose rescheduled/given appropriately -Did receive vanc on 11/29 with HD- done as cabinet override, but then not recharted on [facility]  Microbiology results:  11/23 BCx x 2: negative 11/24 Wound R thigh: rare  CoNS  Thank you for allowing pharmacy to be a part of this patient's care.  Cederic Mozley D. Jilene Spohr, PharmD, BCPS Clinical Pharmacist Pager: [phone] 09/22/2016 2:53 PM

## 2016-09-22 NOTE — Progress Notes (Signed)
  Progress Note    09/22/2016 1:18 PM 7 Days Post-Op  Subjective:  Still having pain rle  Vitals:   09/22/16 0512 09/22/16 1000  BP: 111/77 106/78  Pulse: 97 88  Resp: 16 18  Temp: 98.7 F (37.1 C) 98 F (36.7 C)    Physical Exam: Right groin incision cdi Stable appearing induration and eschar of medial and lateral right thigh, edema of rle mostly resolved  CBC    Component Value Date/Time   WBC 15.1 (H) 09/21/2016 0616   RBC 3.54 (L) 09/21/2016 0616   HGB 9.6 (L) 09/21/2016 0616   HCT 31.2 (L) 09/21/2016 0616   PLT 419 (H) 09/21/2016 0616   MCV 88.1 09/21/2016 0616   MCH 27.1 09/21/2016 0616   MCHC 30.8 09/21/2016 0616   RDW 22.3 (H) 09/21/2016 0616   LYMPHSABS 1.6 09/18/2016 0636   MONOABS 1.0 09/18/2016 0636   EOSABS 0.3 09/18/2016 0636   BASOSABS 0.0 09/18/2016 0636    BMET    Component Value Date/Time   NA 138 09/21/2016 0616   K 3.8 09/21/2016 0616   CL 97 (L) 09/21/2016 0616   CO2 24 09/21/2016 0616   GLUCOSE 85 09/21/2016 0616   BUN 20 09/21/2016 0616   CREATININE 3.68 (H) 09/21/2016 0616   CALCIUM 7.3 (L) 09/21/2016 0616   GFRNONAA 16 (L) 09/21/2016 0616   GFRAA 19 (L) 09/21/2016 0616    INR    Component Value Date/Time   INR 1.35 06/17/2016 0229     Intake/Output Summary (Last 24 hours) at 09/22/16 1318 Last data filed at 09/22/16 0900  Gross per 24 hour  Intake              220 ml  Output                1 ml  Net              219 ml     Assessment:  25 y.o. female is s/p excision of right thigh avg with at least skin necrosis of most of right thigh with likely calciphylaxis  Plan: Local wound care Defining goals of care Will follow   Joud Ingwersen C. Randie Heinz, MD [facility] Office: [phone] Pager: [phone]  09/22/2016 1:18 PM

## 2016-09-22 NOTE — Progress Notes (Signed)
Daily Progress Note   Patient Name: [name] Small       Date: 09/22/2016 DOB: May 17, 1991  Age: 25 y.o. MRN#: [id number] Attending Physician: Domenic Polite, MD Primary Care Physician: Angelica Chessman, MD Admit Date: 09/13/2016  Reason for Consultation/Follow-up: Establishing goals of care  Subjective: Met with patient her mother and father at bedside.  Patient had just attempted to get out of bed with physical therapy and was shuttering in uncontrollable pain despite just receiving both oral and IV pain medication.  After the patient became comfortable we discussed GOC, overall medical condition, and pain control.  Unfortunately conversation went off the rails a bit as patient stated she was recently told that if someone was going to donate a kidney specifically for her - she did not have to go thru the transplant list process.   Additionally she understood that if she received a kidney that her heart failure, bone disease, and calciphylaxis would resolve.  I deferred those questions to her nephrologist.  Both [name] Small and her parents need accurate clarification on these two issues - these answers will effect their decisions about care going forward.  [name] Small's primary medical issues at this point are:  ESRD, bone disease, heart failure (10 - 15%), calciphylaxis, wounds and pain.  After our discussion of her medical issues and her pain - both parents were supportive of [name] Small.  Her father admitted to pushing for [name] Small to keep going in the past - but now states that he does not understand how it must feel to be in such severe unrelenting pain.  He will support her wishes whatever they are.    [name] Small wants to be able to go back to her apartment and live with the help of her parents and boyfriend.  She  wants to be able to get up and make herself breakfast in the morning.    With regard to code status - [name] Small opts to be a DNR.  Advanced directives were completed and are pending signature and notarization.  With regard to pain - [name] Small does not like oral dilaudid she feels the oxy works better.   I will increase her fentanyl patch today and continue IV dilaudid PRN.  Tomorrow PMT will reassess how many PRNs she has required and determine what oral medication she will need at home for break  thru.    Length of Stay: 9  Current Medications: Scheduled Meds:  . acetaminophen  500 mg Oral Q8H  . calcitonin (salmon)  1 spray Alternating Nares Daily  . cinacalcet  90 mg Oral Q supper  . darbepoetin (ARANESP) injection - DIALYSIS  60 mcg Intravenous Q Fri-HD  . diphenhydrAMINE  25 mg Oral Q M,W,F-HD  . enoxaparin (LOVENOX) injection  30 mg Subcutaneous Q24H  . feeding supplement (NEPRO CARB STEADY)  237 mL Oral TID WC  . [START ON 09/24/2016] fentaNYL  37.5 mcg Transdermal Q72H  . gabapentin  200 mg Oral BID  . lidocaine   Topical TID  . LORazepam  1 mg Oral QHS  . methocarbamol  500 mg Oral Q8H  . multivitamin  1 tablet Oral QHS  . potassium chloride  20 mEq Oral Daily  . sertraline  100 mg Oral Daily  . sodium thiosulfate infusion for calciphylaxis  25 g Intravenous Q M,W,F-HD  . vancomycin  500 mg Intravenous Q M,W,F-HD    Continuous Infusions:   PRN Meds: acetaminophen, diphenhydrAMINE **OR** diphenhydrAMINE (BENADRYL) IVPB(SICKLE CELL ONLY), famotidine, HYDROmorphone (DILAUDID) injection, naloxone **AND** sodium chloride flush, ondansetron (ZOFRAN) IV  Physical Exam  Constitutional:  Young petite female, shuttering in pain sitting in her wheel chair.  HENT:  Head: Normocephalic and atraumatic.  Eyes: EOM are normal. No scleral icterus.  Neck: Neck supple. No thyromegaly present.           Vital Signs: BP 106/78 (BP Location: Right Arm)   Pulse 88   Temp 98 F (36.7 C) (Oral)    Resp 18   Ht '5\' 3"'  (1.6 m)   Wt 53.4 kg (117 lb 11.6 oz)   SpO2 98%   BMI 20.85 kg/m  SpO2: SpO2: 98 % O2 Device: O2 Device: Not Delivered O2 Flow Rate: O2 Flow Rate (L/min): 0 L/min  Intake/output summary:  Intake/Output Summary (Last 24 hours) at 09/22/16 1222 Last data filed at 09/22/16 0900  Gross per 24 hour  Intake              220 ml  Output                1 ml  Net              219 ml   LBM: Last BM Date: 09/21/16 Baseline Weight: Weight: 51.3 kg (113 lb) Most recent weight: Weight: 53.4 kg (117 lb 11.6 oz)       Palliative Assessment/Data:    Flowsheet Rows   Flowsheet Row Most Recent Value  Intake Tab  Referral Department  Hospitalist  Unit at Time of Referral  Med/Surg Unit  Palliative Care Primary Diagnosis  Nephrology  Date Notified  09/14/16  Palliative Care Type  New Palliative care  Reason for referral  Clarify Goals of Care, Pain, Psychosocial or Spiritual support  Date of Admission  09/13/16  Date first seen by Palliative Care  09/16/16  # of days Palliative referral response time  2 Day(s)  # of days IP prior to Palliative referral  1  Clinical Assessment  Palliative Performance Scale Score  40%  Pain Max last 24 hours  10  Pain Min Last 24 hours  4  Dyspnea Max Last 24 Hours  0  Dyspnea Min Last 24 hours  0  Nausea Max Last 24 Hours  0  Nausea Min Last 24 Hours  0  Anxiety Max Last 24 Hours  5  Anxiety Min Last  24 Hours  2  Psychosocial & Spiritual Assessment  Palliative Care Outcomes  Patient/Family meeting held?  Yes  Who was at the meeting?  pt  Palliative Care Outcomes  Improved pain interventions, Provided advance care planning  Palliative Care follow-up planned  Yes, Facility      Patient Active Problem List   Diagnosis Date Noted  . DNR (do not resuscitate)   . Pain of right lower extremity   . Palliative care by specialist   . Advance care planning   . Goals of care, counseling/discussion   . 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  Palliative care encounter   . Edema of right lower extremity 09/14/2016  . Leg edema, right 09/14/2016  . Cellulitis 09/13/2016  . Generalized anxiety disorder 09/05/2016  . Post-operative pain   . Labile blood pressure   . Closed nondisplaced fracture of pelvis ([place])   . Other osteoporosis with current pathological fracture   . Recurrent major depressive disorder, in partial remission ([place])   . Acute blood loss anemia   . 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 Anemia of chronic disease   . H/O medication noncompliance   . 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 Acute on chronic systolic CHF (congestive heart failure) ([place]) 08/16/2016  . Noncompliance of patient with renal dialysis ([place]) 08/12/2016  . Hyperparathyroidism due to end stage renal disease on dialysis ([place]) 08/01/2016  . Osteoporosis 08/01/2016  . Thoracic compression fracture - T7, T8, T12 07/27/2016  . Acute pulmonary edema ([place]) 07/26/2016  . Essential hypertension 06/17/2016  . Depression with anxiety 06/17/2016  . Volume overload 04/14/2016  . Peripheral edema 04/14/2016  . NICM (nonischemic cardiomyopathy) ([place]) 04/14/2016  . Internal jugular (IJ) vein thromboembolism, acute ([place]) 04/14/2016  . Pericardial effusion 04/14/2016  . Chronic anticoagulation   . Hypertensive urgency 04/11/2016  . DVT (deep venous thrombosis), left 04/11/2016  . Cellulitis of left upper extremity 03/31/2016  . Chronic pain syndrome 02/29/2016  . Chronic systolic heart failure ([place]) 01/04/2016  . AICD (automatic cardioverter/defibrillator) present 01/04/2016  . 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 Pathologic pelvic fracture 01/04/2016  . Depression 01/04/2016  . Pain in the chest   . ESRD on dialysis ([place])   . Tachycardia   . Elevated serum hCG   . Chest pain 10/11/2015  . Fluid overload 10/10/2015  . Hypertension 10/10/2015  . Anemia of chronic renal failure, stage 5 (HCC) 10/04/2015  . Thrombocytopenia ([place])  10/04/2015  . Chest pain of uncertain etiology 96/28/3662  . Chronically Elevated troponin 03/03/2015  . Hyperkalemia 02/21/2015  . Acute on chronic systolic heart failure ([place]) 02/21/2012  . Pulmonary infarct ([place]) 02/07/2012  . ESRD (end stage renal disease) on dialysis ([place]) 02/03/2012  . PE (pulmonary embolism) 02/03/2012    Palliative Care Assessment & Plan   Patient Profile: 25 y.o. 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 Patient has been in the hospital 13 times in 2017. Patient has now acute on chronic pain secondary to recent compression fractures at levels T7, T8, T12 as well as pelvis. Patient also went to the OR on 09/15/2016 for skin  graft revision on the right. She is verbalizing back pain for the past 2 months and severe right leg pain 1 week. She states the pain is been so severe that she is only walking a few steps in her apartment. Her boyfriend comes over and does her dishes and cooking. She does not have a car and this is made it difficult for her to be compliant with some of her appointments as well as her worsening pain. She states her parents are very actively involved in her life and she describes a close relationship with them.   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  A family with confusion regarding the transplant process, as well as an understanding of whether or not her other issues would resolve with a new kidney.  I believe if [name] Small and her family had accurate clarification of these issues would help them in their decision  making process  Recommendations/Plan:  Increase fentanyl to 37.5.  Continue PRN IV dilaudid for today.   PMT will reassess pain medications on 12/2.  Request Chaplain Service to assist with notarizing Living Will  Code status is DNR.  Request that Nephrology clarify family's questions regarding transplant and whether or not her other medical issues (heart failure) will resolve if she receives a new kidney.  Please ask PT to return on 12/2 to reassess mobility  Goals of Care and Additional Recommendations:  Limitations on Scope of Treatment: Full Scope Treatment with DNR/DNI  Code Status:  DNR  Prognosis:   < 12 months  Discharge Planning:  To Be Determined  Planning for home with home health and palliative.  Care plan was discussed with Dr. Drue Flirt - bedside RN, Family.  Thank you for allowing the Palliative Medicine Team to assist in the care of this patient.   Time In: 11:00 Time Out: 11:45 Total Time 45 min Prolonged Time Billed no      Greater than 50%  of this time was spent counseling and coordinating care related to the above assessment and plan.  Imogene Burn, PA-C Palliative Medicine   Please contact Palliative MedicineTeam phone at [phone] for questions and concerns.   Please see AMION for individual provider pager numbers.

## 2016-09-22 NOTE — Progress Notes (Signed)
Physical Therapy Treatment Patient Details Name: [name] Small MRN: [id number] DOB: 11-25-1990 Today's Date: 09/22/2016    History of Present Illness Collett Jay is a 25 y.o. female with ESRD secondary to polycystic kidney disease, chronic systolic heart failure status post AICD placement last EF measured in September 2017 was 12% and recently has had T-spine fractures presents to the ER because of increasing swelling of the right lower extremity with ulcerations and blisters.     PT Comments    Pt very limited with her mobility due to severe pain.  Family conference today with palliative.  Follow Up Recommendations  Home health PT     Equipment Recommendations  Other (comment) (TBA)    Recommendations for Other Services       Precautions / Restrictions Precautions Precautions: Fall;Back Required Braces or Orthoses: Spinal Brace Spinal Brace: Thoracolumbosacral orthotic    Mobility  Bed Mobility Overal bed mobility: Needs Assistance Bed Mobility: Supine to Sit     Supine to sit: HOB elevated;Min guard     General bed mobility comments: pt sat up from raised HOB easily then scooted her legs and trunk with UE assist and no further assist  Transfers Overall transfer level: Needs assistance Equipment used: Rolling walker (2 wheeled) Transfers: Sit to/from [employer] Sit to Stand: Min assist;+2 safety/equipment Stand pivot transfers: Min assist;+2 safety/equipment       General transfer comment: pt needed min stabily assist due to pain and tremulous UE's and LE's.  Pt stepping tentatively onto R LE sue to pain and went ! 6 feet overall  Ambulation/Gait         Gait velocity: decreased Gait velocity interpretation: Below normal speed for age/gender General Gait Details: pivot to chair and back to bed.   Stairs            Wheelchair Mobility    Modified Rankin (Stroke Patients Only)       Balance Overall balance assessment:  Needs assistance Sitting-balance support: Single extremity supported Sitting balance-Leahy Scale: Good Sitting balance - Comments: Able to adjust brace sitting EOB without difficulty.    Standing balance support: Bilateral upper extremity supported Standing balance-Leahy Scale: Poor Standing balance comment: heavy use of the RW to decrease weight on her LE's                    Cognition Arousal/Alertness: Awake/alert Behavior During Therapy: WFL for tasks assessed/performed Overall Cognitive Status: Within Functional Limits for tasks assessed                      Exercises      General Comments General comments (skin integrity, edema, etc.): Palliative in today for a family confidence      Pertinent Vitals/Pain Pain Assessment: Faces Faces Pain Scale: Hurts worst Pain Location: bothe LE r>L Pain Descriptors / Indicators: Burning Pain Intervention(s): Monitored during session;Repositioned;RN gave pain meds during session    Home Living                      Prior Function            PT Goals (current goals can now be found in the care plan section) Acute Rehab PT Goals Patient Stated Goal: get back home PT Goal Formulation: With patient Time For Goal Achievement: 10/05/16 Potential to Achieve Goals: Fair Progress towards PT goals: Not progressing toward goals - comment (limited by pain)    Frequency  Min 3X/week      PT Plan Current plan remains appropriate    Co-evaluation             End of Session   Activity Tolerance: Patient limited by pain Patient left: in bed;with call bell/phone within reach;with bed alarm set     Time: [id number] PT Time Calculation (min) (ACUTE ONLY): 24 min  Charges:  $Therapeutic Activity: 23-37 mins                    G CodesEliseo Gum:      Selenne Coggin V Yentl Verge 09/22/2016, 12:36 PM 09/22/2016  Van BingKen Vidal Lampkins, PT [phone]  (pager)

## 2016-09-22 NOTE — Progress Notes (Signed)
[facility] Progress Note  Dialysis Orders: MWF 4h Opiflux 180 BFR 400/800 2K/2Ca EDW 49.5kg left fem TDC Heparin 2000 U IV Bolus q HD  Hectorol IV q HD  Aranesp 60 mcg IV q week (not given yet)  Assessment: 1.  SP removal right thigh graft 11/24 - due to blistering and skin necrosis. 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 2. ESRD-MWF HD / regular diet / po KCl to keep K up given need for low Ca bath / benadryl 25 mg given with HD to help with itching. 3. Anemia- hgb 8.8 pre HD Wed and 9.6 Thursday -Aranesp 60 given 11/24; last tsat 11% 10/27 - check Fe studies Friday  4. Leg wounds/ calciphylaxis -  bilat LE gangrenous skin changes; biopsy showed necrotic changes only but clinical picture c/w calciphylaxis. Started Na thio TIW with HD, hold all vit D/ Ca products. Cont sensipar and non Ca binders. Use low Ca bath so will need K+supplements. Decreased KCL to 20 mEQ / day now. Goal corr Ca low range of normal ~8-8.5. 5. Sec HPTH- ipTH 2 ,569 On 08/30/16 - P now 1.9 will hold fosrenol. Liberalized diet so there may be more P; keep same fosrenol dose Have stopped VDRA; on sensipar 90 q supper  /2 Ca bath/calcitonin q day  6. Hypotension/volume- net UF 3 L Tuesday post weight 51.4- Net UF 4 L on Wed with post wt not done - pre weight was 54.5 - so still not to edw -  7. Nutrition- alb 2.5, liberalized diet to ^ K 8. Symptom management- Palliative care has seen with recommendations for care outlined. Appreciate their involvement. Discussed transitioning to PO meds 9. NICM s/p AICD - EF 20-25% 06/19/2016   Plan - HD today, Na thio, low Ca bath, local wound care.  Vinson Moselle MD [employer] pgr [phone]   09/22/2016, 4:05 PM     Subjective:   No new c/o.  Made DNR after pall care meeting today, wants to continue dialysis.    Objective Vitals:   09/22/16 1400 09/22/16 1411 09/22/16 1430 09/22/16 1500  BP: 133/78 130/85  126/60 (!) 95/56  Pulse: 92 94 90 94  Resp: 15     Temp: 97.9 F (36.6 C)     TempSrc: Oral     SpO2: 98%     Weight: 50.6 kg (111 lb 8.8 oz)     Height:       Physical Exam General: NAD Heart: RRR Lungs: no rales Abdomen:soft Extremities: no sig LE edema; still tender to slight touch; scattered areas dressed with cushion pads/right thigh wrapped. Dialysis Access: left fem cath   Additional Objective Labs: Basic Metabolic Panel:  Recent Labs Lab 09/20/16 0639 09/21/16 0616 09/22/16 1330  NA 142 138 144  K 4.9 3.8 4.9  CL 102 97* 106  CO2 18* 24 21*  GLUCOSE 73 85 119*  BUN 41* 20 47*  CREATININE 5.90* 3.68* 5.63*  CALCIUM 8.0* 7.3* 7.2*  PHOS 2.5 1.9* 2.8   Liver Function Tests:  Recent Labs Lab 09/20/16 0639 09/21/16 0616 09/22/16 1330  ALBUMIN 2.4* 2.2* 2.2*   No results for input(s): LIPASE, AMYLASE in the last 168 hours. CBC:  Recent Labs Lab 09/18/16 0636 09/19/16 0420 09/20/16 0639 09/21/16 0616 09/22/16 1330  WBC 13.7* 15.7* 13.9* 15.1* 14.7*  NEUTROABS 10.8*  --   --   --   --   HGB 9.3* 9.3* 8.8* 9.6* 8.8*  HCT 30.7* 30.3* 28.8*  31.2* 28.3*  MCV 89.8 89.9 87.8 88.1 87.6  PLT 380 418* 424* 419* 399   Blood Culture    Component Value Date/Time   SDES WOUND RIGHT THIGH 09/15/2016 1734   SPECREQUEST PATIENT ON FOLLOWING ZOSYN NO ANAEROBIC SWAB SENT 09/15/2016 1734   CULT  09/15/2016 1734    RARE STAPHYLOCOCCUS SPECIES (COAGULASE NEGATIVE) CALL MICROBIOLOGY LAB IF SENSITIVITIES ARE REQUIRED. NO ANAEROBES ISOLATED    REPTSTATUS 09/20/2016 FINAL 09/15/2016 1734    Cardiac Enzymes: No results for input(s): CKTOTAL, CKMB, CKMBINDEX, TROPONINI in the last 168 hours. CBG:  Recent Labs Lab 09/17/16 0000 09/17/16 0801 09/17/16 1207 09/17/16 1649 09/17/16 2030  GLUCAP 87 126* 106* 99 90  Medications:  . acetaminophen  500 mg Oral Q8H  . calcitonin (salmon)  1 spray Alternating Nares Daily  . cinacalcet  90 mg Oral Q supper  .  darbepoetin (ARANESP) injection - DIALYSIS  60 mcg Intravenous Q Fri-HD  . diphenhydrAMINE  25 mg Oral Q M,W,F-HD  . enoxaparin (LOVENOX) injection  30 mg Subcutaneous Q24H  . feeding supplement (NEPRO CARB STEADY)  237 mL Oral TID WC  . [START ON 09/24/2016] fentaNYL  37.5 mcg Transdermal Q72H  . gabapentin  200 mg Oral BID  . lidocaine   Topical TID  . LORazepam  1 mg Oral QHS  . methocarbamol  500 mg Oral Q8H  . multivitamin  1 tablet Oral QHS  . potassium chloride  20 mEq Oral Daily  . sertraline  100 mg Oral Daily  . sodium thiosulfate infusion for calciphylaxis  25 g Intravenous Q M,W,F-HD  . vancomycin  500 mg Intravenous Q M,W,F-HD

## 2016-09-22 NOTE — Progress Notes (Signed)
PROGRESS NOTE [facility]   PanoraVictoria Small   JXB:[id number] DOB: 07-Jul-1991  DOA: 09/13/2016 PCP: Jeanann LewandowskyJEGEDE, OLUGBEMIGA, MD   Brief Narrative:  [name] a 25 y.o. 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 Recently hospitalized from 10/22-11/10/17 with T7,T8,T12 vertebral compression fractures and volume overload related to noncompliance with HD. She had a new right femoral AVG placed during admission on 08/31/16. Graft has not been used for HD. Presented to the ER because of increasing swelling of the right lower extremity with ulcerations and blisters. CT scan was done which was showing diffuse edema. Patient has had recent AV graft placed on right lower extremity and gets her dialysis done on the left lower extremity dialysis catheter. On exam patient has eschar over the recent AV graft placement on the right lower extremity with blisters in the lower extremity both left and right. 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 - Palliative care consult appreciated, PCA -stopped yesterday and started Oral and IV narcotics, now DNR, plan to continue HD - Stop IV Vancomycin if ok with CCS  Hypotension - borderline low   -soft, monitor  ESRD on hemodialysis TTS - Electrolyte abnormalities  -Renal recommendations  appreciated -HD per Renal  Nonischemic cardiomyopathy S/P AICD placement  -EF 06/2016 =12% -volume managed with HD  Z3,Y8,M57T7,T8,T12 vertebral compression fractures -supportive care, PCA pump per palliative care on 11/28  Chronic anemia - hemoglobin stable at baseline -stable  DVT prophylaxis: Pt refusing all heparin injections: switched to lovenox 11/27 and still refusing, counseled patient about risks including death Code Status: Full Family Communication: none at bedside Disposition Plan: home when able to ambulate, pain controlled, not stable for outpatient HD at this time  Consultants:   Vascular surgery Dr. Randie Heinzain  Nephrology Dr. Briant CedarMattingly  Procedures:  11/22 CT bilateral lower extremity:-No evidence of fracture or dislocation. -. Diffuse soft tissue edema involving right hip/entirety of right lower extremity, with large 5.4 cm fluid-filled blister along the anterolateral right lower leg. Multiple smaller blisters along the medial right thigh. Negative abscess - Small bilateral knee joint effusions, Rt>> Lt   Antimicrobials:  Zosyn 11/22  Vancomycin 11/22  Objective: Vitals:   09/21/16 2043 09/21/16 2328 09/22/16 0512 09/22/16 1000  BP: (!) 131/99  111/77 106/78  Pulse: 98  97 88  Resp: 16 14 16 18   Temp: 98.2 F (36.8 C)  98.7 F (37.1 C) 98 F (36.7 C)  TempSrc: Oral  Oral Oral  SpO2: 100% 94% 96% 98%  Weight: 53.4 kg (117 lb 11.6 oz)     Height:        Intake/Output Summary (Last 24 hours) at 09/22/16 1338 Last data filed at 09/22/16 1300  Gross per 24 hour  Intake              460 ml  Output  1 ml  Net              459 ml   Filed Weights   09/20/16 1005 09/20/16 2120 09/21/16 2043  Weight: 54.5 kg (120 lb 2.4 oz) 55.3 kg (121 lb 14.6 oz) 53.4 kg (117 lb 11.6 oz)   Examination:  General exam: AAOx3, chronically ill appearing Respiratory system: Clear to auscultation.  Cardiovascular system: S1 & S2 heard, RRR.  Extremities: Range of  movement continues to improve. Right lower extremity with edema decreasing. Penrose on R thigh    Skin: Blister in legs/ extremities. Fistula in the left arm. R thigh with multiple wounds, some deep without active purulence Psychiatry: Withdrawn and flat affect  Data Reviewed: I have personally reviewed following labs and imaging studies  CBC:  Recent Labs Lab 09/17/16 0738 09/18/16 0636 09/19/16 0420 09/20/16 0639 09/21/16 0616  WBC 14.0* 13.7* 15.7* 13.9* 15.1*  NEUTROABS  --  10.8*  --   --   --   HGB 9.3* 9.3* 9.3* 8.8* 9.6*  HCT 31.3* 30.7* 30.3* 28.8* 31.2*  MCV 90.7 89.8 89.9 87.8 88.1  PLT 364 380 418* 424* 419*   Basic Metabolic Panel:  Recent Labs Lab 09/17/16 2309 09/18/16 0636 09/19/16 0420 09/20/16 0639 09/21/16 0616  NA 140 142 142 142 138  K 3.7 3.5 3.8 4.9 3.8  CL 99* 99* 101 102 97*  CO2 24 27 23  18* 24  GLUCOSE 90 79 75 73 85  BUN 62* 69* 85* 41* 20  CREATININE 7.09* 7.45* 8.87* 5.90* 3.68*  CALCIUM 7.9* 8.0* 8.0* 8.0* 7.3*  PHOS  --   --  3.3 2.5 1.9*   GFR: Estimated Creatinine Clearance: 19.3 mL/min (by C-G formula based on SCr of 3.68 mg/dL (H)). Liver Function Tests:  Recent Labs Lab 09/19/16 0420 09/20/16 0639 09/21/16 0616  ALBUMIN 2.2* 2.4* 2.2*   No results for input(s): LIPASE, AMYLASE in the last 168 hours. No results for input(s): AMMONIA in the last 168 hours. Coagulation Profile: No results for input(s): INR, PROTIME in the last 168 hours. Cardiac Enzymes: No results for input(s): CKTOTAL, CKMB, CKMBINDEX, TROPONINI in the last 168 hours. BNP (last 3 results) No results for input(s): PROBNP in the last 8760 hours. HbA1C: No results for input(s): HGBA1C in the last 72 hours. CBG:  Recent Labs Lab 09/17/16 0000 09/17/16 0801 09/17/16 1207 09/17/16 1649 09/17/16 2030  GLUCAP 87 126* 106* 99 90   Lipid Profile: No results for input(s): CHOL, HDL, LDLCALC, TRIG, CHOLHDL, LDLDIRECT in the last 72 hours. Thyroid  Function Tests: No results for input(s): TSH, T4TOTAL, FREET4, T3FREE, THYROIDAB in the last 72 hours. Anemia Panel: No results for input(s): VITAMINB12, FOLATE, FERRITIN, TIBC, IRON, RETICCTPCT in the last 72 hours. Sepsis Labs: No results for input(s): PROCALCITON, LATICACIDVEN in the last 168 hours.  Recent Results (from the past 240 hour(s))  Culture, blood (routine x 2)     Status: None   Collection Time: 09/14/16  3:23 PM  Result Value Ref Range Status   Specimen Description BLOOD RIGHT HAND  Final   Special Requests IN PEDIATRIC BOTTLE 2CC  Final   Culture NO GROWTH 5 DAYS  Final   Report Status 09/19/2016 FINAL  Final  Culture, blood (routine x 2)     Status: None   Collection Time: 09/14/16  3:34 PM  Result Value Ref Range Status   Specimen Description BLOOD RIGHT ARM  Final   Special Requests IN PEDIATRIC BOTTLE [facility]  Final   Culture NO GROWTH 5 DAYS  Final   Report Status 09/19/2016 FINAL  Final  Aerobic/Anaerobic Culture (surgical/deep wound)     Status: None   Collection Time: 09/15/16  5:34 PM  Result Value Ref Range Status   Specimen Description WOUND RIGHT THIGH  Final   Special Requests PATIENT ON FOLLOWING ZOSYN NO ANAEROBIC SWAB SENT  Final   Gram Stain NO WBC SEEN NO ORGANISMS SEEN   Final   Culture   Final    RARE STAPHYLOCOCCUS SPECIES (COAGULASE NEGATIVE) CALL MICROBIOLOGY LAB IF SENSITIVITIES ARE REQUIRED. NO ANAEROBES ISOLATED    Report Status 09/20/2016 FINAL  Final    Radiology Studies: No results found.  Scheduled Meds: . acetaminophen  500 mg Oral Q8H  . calcitonin (salmon)  1 spray Alternating Nares Daily  . cinacalcet  90 mg Oral Q supper  . darbepoetin (ARANESP) injection - DIALYSIS  60 mcg Intravenous Q Fri-HD  . diphenhydrAMINE  25 mg Oral Q M,W,F-HD  . enoxaparin (LOVENOX) injection  30 mg Subcutaneous Q24H  . feeding supplement (NEPRO CARB STEADY)  237 mL Oral TID WC  . [START ON 09/24/2016] fentaNYL  37.5 mcg Transdermal Q72H  .  gabapentin  200 mg Oral BID  . lidocaine   Topical TID  . LORazepam  1 mg Oral QHS  . methocarbamol  500 mg Oral Q8H  . multivitamin  1 tablet Oral QHS  . potassium chloride  20 mEq Oral Daily  . sertraline  100 mg Oral Daily  . sodium thiosulfate infusion for calciphylaxis  25 g Intravenous Q M,W,F-HD  . vancomycin  500 mg Intravenous Q M,W,F-HD   Continuous Infusions:    LOS: 9 days   Zannie Cove, MD [facility] Pager [phone]  If 7PM-7AM, please contact night-coverage [email or web address] Password [email or web address] 09/22/2016, 1:38 PM

## 2016-09-23 ENCOUNTER — Encounter (HOSPITAL_COMMUNITY): Payer: Self-pay | Admitting: Nephrology

## 2016-09-23 MED ORDER — SODIUM CHLORIDE 0.9 % IV SOLN
125.0000 mg | INTRAVENOUS | Status: DC
  Administered 2016-09-27: 125 mg via INTRAVENOUS
  Filled 2016-09-23 (×2): qty 10

## 2016-09-23 MED ORDER — FENTANYL 50 MCG/HR TD PT72
75.0000 ug | MEDICATED_PATCH | TRANSDERMAL | Status: DC
  Administered 2016-09-23: 75 ug via TRANSDERMAL

## 2016-09-23 MED ORDER — FENTANYL 50 MCG/HR TD PT72
MEDICATED_PATCH | TRANSDERMAL | Status: AC
  Filled 2016-09-23: qty 1

## 2016-09-23 MED ORDER — FENTANYL 12 MCG/HR TD PT72
MEDICATED_PATCH | TRANSDERMAL | Status: AC
  Filled 2016-09-23: qty 2

## 2016-09-23 NOTE — Progress Notes (Signed)
PROGRESS NOTE [facility]   [place]   ZOX:[id number] DOB: July 14, 1991  DOA: 09/13/2016 PCP: Jeanann LewandowskyJEGEDE, OLUGBEMIGA, MD   Brief Narrative:  [name] ChimeraVictoria Small a 25 y.o. 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 Recently hospitalized from 10/22-11/10/17 with T7,T8,T12 vertebral compression fractures and volume overload related to noncompliance with HD. She had a new right femoral AVG placed during admission on 08/31/16. Graft has not been used for HD. Presented to the ER because of increasing swelling of the right lower extremity with ulcerations and blisters. CT scan was done which was showing diffuse edema. Patient has had recent AV graft placed on right lower extremity and gets her dialysis done on the left lower extremity dialysis catheter. On exam patient has eschar over the recent AV graft placement on the right lower extremity with blisters in the lower extremity both left and right. 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 - Palliative care consult appreciated, PCA -stopped yesterday and started Oral and IV narcotics, now DNR, plan to continue HD - ambulate with PT  Hypotension - borderline low   -soft, monitor  ESRD on hemodialysis TTS - Electrolyte  abnormalities  -Renal recommendations appreciated -HD per Renal  Nonischemic cardiomyopathy S/P AICD placement  -EF 06/2016 =12% -volume managed with HD  W1,X9,J47T7,T8,T12 vertebral compression fractures -supportive care, PCA pump per palliative care on 11/28  Chronic anemia - hemoglobin stable at baseline -stable  DVT prophylaxis: Pt refusing all heparin injections: switched to lovenox 11/27 and still refusing, counseled patient about risks including death Code Status: Full Family Communication: none at bedside Disposition Plan: home when able to ambulate, pain controlled, not stable for outpatient HD at this time  Consultants:   Vascular surgery Dr. Randie Heinzain  Nephrology Dr. Briant CedarMattingly  Procedures:  11/22 CT bilateral lower extremity:-No evidence of fracture or dislocation. -. Diffuse soft tissue edema involving right hip/entirety of right lower extremity, with large 5.4 cm fluid-filled blister along the anterolateral right lower leg. Multiple smaller blisters along the medial right thigh. Negative abscess - Small bilateral knee joint effusions, Rt>> Lt   Antimicrobials:  Zosyn 11/22  Vancomycin 11/22  Objective: Vitals:   09/22/16 2203 09/23/16 0538 09/23/16 0911 09/23/16 1101  BP: (!) 136/93 101/69 91/72 121/73  Pulse: 98 88 76 96  Resp: 14 15 16 16   Temp: 98.3 F (36.8 C) 98.7 F (37.1 C) 98 F (36.7 C)   TempSrc: Oral Oral Oral   SpO2: 100% 100% 100%   Weight: 47.9 kg (105 lb 9.6 oz)     Height:        Intake/Output Summary (Last 24 hours) at 09/23/16 1459 Last data filed at 09/23/16 0900  Gross per 24 hour  Intake              280 ml  Output  2500 ml  Net            -2220 ml   Filed Weights   09/22/16 1400 09/22/16 1811 09/22/16 2203  Weight: 50.6 kg (111 lb 8.8 oz) 47.8 kg (105 lb 6.1 oz) 47.9 kg (105 lb 9.6 oz)   Examination:  General exam: AAOx3, chronically ill appearing Respiratory system: Clear to auscultation.  Cardiovascular system: S1 &  S2 heard, RRR.  Extremities: Range of movement continues to improve. Right lower extremity with edema decreasing. Penrose on R thigh    Skin: Blister in legs/ extremities. Fistula in the left arm. R thigh with multiple wounds, some deep without active purulence Psychiatry: Withdrawn and flat affect  Data Reviewed: I have personally reviewed following labs and imaging studies  CBC:  Recent Labs Lab 09/18/16 0636 09/19/16 0420 09/20/16 0639 09/21/16 0616 09/22/16 1330  WBC 13.7* 15.7* 13.9* 15.1* 14.7*  NEUTROABS 10.8*  --   --   --   --   HGB 9.3* 9.3* 8.8* 9.6* 8.8*  HCT 30.7* 30.3* 28.8* 31.2* 28.3*  MCV 89.8 89.9 87.8 88.1 87.6  PLT 380 418* 424* 419* 399   Basic Metabolic Panel:  Recent Labs Lab 09/18/16 0636 09/19/16 0420 09/20/16 0639 09/21/16 0616 09/22/16 1330  NA 142 142 142 138 144  K 3.5 3.8 4.9 3.8 4.9  CL 99* 101 102 97* 106  CO2 27 23 18* 24 21*  GLUCOSE 79 75 73 85 119*  BUN 69* 85* 41* 20 47*  CREATININE 7.45* 8.87* 5.90* 3.68* 5.63*  CALCIUM 8.0* 8.0* 8.0* 7.3* 7.2*  PHOS  --  3.3 2.5 1.9* 2.8   GFR: Estimated Creatinine Clearance: 11.6 mL/min (by C-G formula based on SCr of 5.63 mg/dL (H)). Liver Function Tests:  Recent Labs Lab 09/19/16 0420 09/20/16 0639 09/21/16 0616 09/22/16 1330  ALBUMIN 2.2* 2.4* 2.2* 2.2*   No results for input(s): LIPASE, AMYLASE in the last 168 hours. No results for input(s): AMMONIA in the last 168 hours. Coagulation Profile: No results for input(s): INR, PROTIME in the last 168 hours. Cardiac Enzymes: No results for input(s): CKTOTAL, CKMB, CKMBINDEX, TROPONINI in the last 168 hours. BNP (last 3 results) No results for input(s): PROBNP in the last 8760 hours. HbA1C: No results for input(s): HGBA1C in the last 72 hours. CBG:  Recent Labs Lab 09/17/16 0000 09/17/16 0801 09/17/16 1207 09/17/16 1649 09/17/16 2030  GLUCAP 87 126* 106* 99 90   Lipid Profile: No results for input(s): CHOL, HDL, LDLCALC,  TRIG, CHOLHDL, LDLDIRECT in the last 72 hours. Thyroid Function Tests: No results for input(s): TSH, T4TOTAL, FREET4, T3FREE, THYROIDAB in the last 72 hours. Anemia Panel:  Recent Labs  09/22/16 1330  FERRITIN 816*  TIBC 134*  IRON 37   Sepsis Labs: No results for input(s): PROCALCITON, LATICACIDVEN in the last 168 hours.  Recent Results (from the past 240 hour(s))  Culture, blood (routine x 2)     Status: None   Collection Time: 09/14/16  3:23 PM  Result Value Ref Range Status   Specimen Description BLOOD RIGHT HAND  Final   Special Requests IN PEDIATRIC BOTTLE 2CC  Final   Culture NO GROWTH 5 DAYS  Final   Report Status 09/19/2016 FINAL  Final  Culture, blood (routine x 2)     Status: None   Collection Time: 09/14/16  3:34 PM  Result Value Ref Range Status   Specimen Description BLOOD RIGHT ARM  Final   Special Requests IN PEDIATRIC BOTTLE [facility]  Final   Culture NO GROWTH 5 DAYS  Final   Report Status 09/19/2016 FINAL  Final  Aerobic/Anaerobic Culture (surgical/deep wound)     Status: None   Collection Time: 09/15/16  5:34 PM  Result Value Ref Range Status   Specimen Description WOUND RIGHT THIGH  Final   Special Requests PATIENT ON FOLLOWING ZOSYN NO ANAEROBIC SWAB SENT  Final   Gram Stain NO WBC SEEN NO ORGANISMS SEEN   Final   Culture   Final    RARE STAPHYLOCOCCUS SPECIES (COAGULASE NEGATIVE) CALL MICROBIOLOGY LAB IF SENSITIVITIES ARE REQUIRED. NO ANAEROBES ISOLATED    Report Status 09/20/2016 FINAL  Final    Radiology Studies: No results found.  Scheduled Meds: . acetaminophen  500 mg Oral Q8H  . calcitonin (salmon)  1 spray Alternating Nares Daily  . cinacalcet  90 mg Oral Q supper  . darbepoetin (ARANESP) injection - DIALYSIS  60 mcg Intravenous Q Fri-HD  . diphenhydrAMINE  25 mg Oral Q M,W,F-HD  . enoxaparin (LOVENOX) injection  30 mg Subcutaneous Q24H  . feeding supplement (NEPRO CARB STEADY)  237 mL Oral TID WC  . fentaNYL      . fentaNYL      .  fentaNYL  75 mcg Transdermal Q72H  . [START ON 09/27/2016] ferric gluconate (FERRLECIT/NULECIT) IV  125 mg Intravenous Q Wed-HD  . gabapentin  300 mg Oral BID  . lidocaine   Topical TID  . LORazepam  1 mg Oral QHS  . methocarbamol  500 mg Oral Q8H  . multivitamin  1 tablet Oral QHS  . potassium chloride  20 mEq Oral Daily  . sertraline  100 mg Oral Daily  . sodium thiosulfate infusion for calciphylaxis  25 g Intravenous Q M,W,F-HD   Continuous Infusions:    LOS: 10 days   Zannie Cove, MD [facility] Pager [phone]  If 7PM-7AM, please contact night-coverage [email or web address] Password [email or web address] 09/23/2016, 2:59 PM

## 2016-09-23 NOTE — Progress Notes (Addendum)
[facility] [place]  Subjective  - Painful legs and feet.   Objective 91/72 76 98 F (36.7 C) (Oral) 16 100%  Intake/Output Summary (Last 24 hours) at 09/23/16 1100 Last data filed at 09/23/16 0900  Gross per 24 hour  Intake              520 ml  Output             2500 ml  Net            -1980 ml   Incisions intact Black eschar areas unchanged lateral hip and medical thigh right LE   Assessment/Planning: 25 y.o. female is s/p excision of right thigh avg with at least skin necrosis of most of right thigh with likely calciphylaxis Continue current wound care  Thomasena Edis [facility] 09/23/2016 11:00 AM --  Laboratory Lab Results:  Recent Labs  09/21/16 0616 09/22/16 1330  WBC 15.1* 14.7*  HGB 9.6* 8.8*  HCT 31.2* 28.3*  PLT 419* 399   BMET  Recent Labs  09/21/16 0616 09/22/16 1330  NA 138 144  K 3.8 4.9  CL 97* 106  CO2 24 21*  GLUCOSE 85 119*  BUN 20 47*  CREATININE 3.68* 5.63*  CALCIUM 7.3* 7.2*    COAG Lab Results  Component Value Date   INR 1.35 06/17/2016   INR 1.55 (H) 04/12/2016   INR 1.35 03/31/2016   No results found for: PTT   I have independently interviewed patient and agree with PA assessment and plan above. Should be ok to d/c abx without any definitive infection. Continue local wound care of Right thigh and would only debride for obvious abscess.  Greidys Deland C. Randie Heinz, MD [facility] [place] Office: [phone] Pager: [phone]

## 2016-09-23 NOTE — Progress Notes (Signed)
Subjective:  Reports pain better controlled with pain meds today / HD yest. On schedule   Objective Vital signs in last 24 hours: Vitals:   09/22/16 1800 09/22/16 1811 09/22/16 2203 09/23/16 0538  BP: 116/69 115/65 (!) 136/93 101/69  Pulse: 93 91 98 88  Resp:  13 14 15   Temp:  98.4 F (36.9 C) 98.3 F (36.8 C) 98.7 F (37.1 C)  TempSrc:  Oral Oral Oral  SpO2:  99% 100% 100%  Weight:  47.8 kg (105 lb 6.1 oz) 47.9 kg (105 lb 9.6 oz)   Height:       Weight change: -2.8 kg (-6 lb 2.8 oz)  Physical Exam: General: NAD, Young AAF/ O X4 Heart: RRR Lungs:  CTA /no rales Abdomen:soft, NT, ND Extremities: no sig LE edema; still tender to slight touch; scattered areas dressed with cushion pads/right thigh wrapped. Dialysis Access: left fem. Perm cath   Dialysis Orders: MWF 4h Opiflux 180 BFR 40Theron ArisKentuckytTheda Oaks Gastro[facility] Center LLoyal DFirst Gi EndoscopyTheron ArisKentuckytGlbesc LLC Dba Memorialcare Outpatient Surgical Center Long BeaLoyal DTheron ArisKentuckytSkypark Surgery Center LLoyal DThibodaux RegioLincoTheron ArisKentuckytSouth Lyon Medical CentLoyal Theron ArisKentuckytCoquille Valley Hospital DistriLoyal DMission Community HoTheron ArisKentuckytValley Baptist Medical Center - BrownsvilLoyal DSoutheast Louisiana VeteranTheron ArisKentuckytCovenant High Plains Surgery Center LLoyal DKindreTheron ArisKentuckytJack Hughston Memorial HospitLoyal DBrevKaiser Fnd Hosp - South EnTheron ArisKentuckytSt. Vincent MorriltLoyal DNySt LucysTheron ArisKentuckytHealth Alliance Hospital - Burbank CampLoyal DPikevTheron ArisKentuckytPerry Memorial HospiTheron ArisKentuckytCentura Health-Penrose St Francis Health ServicLoyal DEncompass Health Nittany Valley RehabPaloTheron ArisKentuckytLake'S Crossing CentLoyal DBlue Island Hospital Co LLC Dba MetrosTheron ArisKentuckytHuntsville Memorial HospitLoyal DAcuity Specialty Hospital - OhioBarnesville HospitTheron ArisKentuckytAmbulatory Surgery Center Of Greater [place] LLoyal DCarolina Center ForProgressive Surgical InsTheron ArisKentuckytVeritas Collaborative [place] Point LLoyal DThe Alexandria OphSurgical Specialty AssoOld TowTheron ArisKentuckytDominican Hospital-Santa Cruz/FrederiLoyal DKindred HospTheron ArisKentuckytNew Milford HospitLoyal DCampbell Clinic Surgicare Of MLourdes Counseling CenAriHarriett Mayford Kn<MEASTheron ArisKentuckytPrague Community HospitLoyal DLone Star EndoscopCleveland Asc LLC Dba ClevTheron ArisKentuckytSt Louis Surgical CenTheron ArisKentuckytColumbia Basin HospitLoyal DWake FoColumbus EndosTheron ArisKentuckytNorwood Endoscopy Center LLoyal DFranTheron ArisKentuckytSilver Lake Medical Center-Ingleside CampLoyal DHarneySalem TownshiPacific Alliance Medical Center, InAriHarriett Mayford Knifeenatontalospitalst Endo Greenville Community HospiAriHarriett Mayford Kn<MEASUREMENTKor 1/24 - removed because of skin necrosis, suspected also calciphylaxis-related. 2. ESRD-MWF HD / regular diet / K 4.9 yest  Fu am labs/ ate all breakfast/ earlier in week started   po KCl to keep K up given need for low Ca bath / also requires  benadryl 25 mg given with HD to help with itching. 3. Anemia- hgb 8.8 pre HD yest. -Aranesp 60 given 11/24; last tsat 11% 10/27 - Fe studies 12/01 =  Give venofer load x 5 hd txs   4. Leg wounds/ calciphylaxis -  bilat LE gangrenous skin changes; biopsy showed necrotic changes only but clinical picture c/w calciphylaxis. Started Na thio TIW with HD, hold all vit D/ Ca products. Cont sensipar and non Ca binders. Use low Ca bath so will need K+supplements. Decreased KCL to 20 mEQ / day now. Goal corr Ca low range of normal ~8-8.5. 5. Sec HPTH- ipTH 2 ,569 On 08/30/16 - P now 1.9 >2.8 yest.= holding  fosrenol. Liberalized diet so there may be more P; Have stopped VDRA; on sensipar  90 q supper (having some nusea with it but  Trying to take )  /2 Ca bath/calcitonin q day  6. Hypotension/volume- now below edw  With wt yest 47.9 and  2500 uf  ( prior edw 49.5 )  7. Nutrition- alb 2.5, liberalized diet to ^ K 8. Symptom management- Palliative care has seen with recommendations for care outlined. Appreciate their involvement. Discussed transitioning to PO meds/ STILL Needs IV  Pain meds with Dressing changes  To legs  9. NICM s/p AICD - EF 20-25% 06/19/2016  David Zeyfang, PA-C [facility] Beeper [phone] 09/23/2016,8:37 AM  LOS: 10 days   Pt seen, examined and agree w A/P as above.  Rob Cassey Hurrell MD [facility] pager [phone]   09/23/2016, 11:44 AM    Labs: Basic Metabolic Panel:  Recent Labs Lab 09/20/16 0639 09/21/16 0616 09/22/16 1330  NA 142 138 144  K 4.9 3.8 4.9  CL 102 97* 106  CO2 18* 24 21*  GLUCOSE 73 85 119*  BUN 41* 20 47*  CREATININE 5.90* 3.68* 5.63*  CALCIUM 8.0* 7.3* 7.2*  PHOS 2.5 1.9* 2.8   Liver Function Tests:  Recent Labs Lab 09/20/16 0639 09/21/16 0616 09/22/16 1330  ALBUMIN 2.4* 2.2* 2.2*  No results for input(s): LIPASE, AMYLASE in the last 168 hours. No results for input(s): AMMONIA in the last 168 hours. CBC:  Recent Labs Lab 09/18/16 0636 09/19/16 0420 09/20/16 0639 09/21/16 0616 09/22/16 1330  WBC 13.7* 15.7* 13.9* 15.1* 14.7*  NEUTROABS 10.8*  --   --   --   --   HGB 9.3* 9.3* 8.8* 9.6* 8.8*  HCT 30.7* 30.3* 28.8* 31.2* 28.3*  MCV 89.8 89.9 87.8 88.1 87.6  PLT 380 418* 424* 419* 399   Cardiac Enzymes: No results for input(s): CKTOTAL, CKMB, CKMBINDEX, TROPONINI in the last 168 hours. CBG:  Recent Labs Lab 09/17/16 0000 09/17/16 0801 09/17/16 1207 09/17/16 1649 09/17/16 2030  GLUCAP 87 126* 106* 99 90    Studies/Results: No results found. Medications:  . acetaminophen  500 mg Oral Q8H  . calcitonin (salmon)  1 spray Alternating Nares Daily  . cinacalcet   90 mg Oral Q supper  . darbepoetin (ARANESP) injection - DIALYSIS  60 mcg Intravenous Q Fri-HD  . diphenhydrAMINE  25 mg Oral Q M,W,F-HD  . enoxaparin (LOVENOX) injection  30 mg Subcutaneous Q24H  . feeding supplement (NEPRO CARB STEADY)  237 mL Oral TID WC  . gabapentin  300 mg Oral BID  . lidocaine   Topical TID  . LORazepam  1 mg Oral QHS  . methocarbamol  500 mg Oral Q8H  . multivitamin  1 tablet Oral QHS  . potassium chloride  20 mEq Oral Daily  . sertraline  100 mg Oral Daily  . sodium thiosulfate infusion for calciphylaxis  25 g Intravenous Q M,W,F-HD  . vancomycin  500 mg Intravenous Q M,W,F-HD

## 2016-09-23 NOTE — Progress Notes (Signed)
Daily Progress Note   Patient Name: [name] Small       Date: 09/23/2016 DOB: [birth year]  Age: 25 y.o. MRN#: [id number] Attending Physician: Domenic Polite, MD Primary Care Physician: Angelica Chessman, MD Admit Date: 09/13/2016  Reason for Consultation/Follow-up: Pain control  Met [name] at bedside.  In addition to the 37.5 mcg fentanyl patch she required 8 mg of IV dilaudid overnight.  She stated when she tried to get out of bed to the commode pain was so severe the tech just picked her up and placed her on the commode.    In bed lying still pain is tolerable.  Applying lidocaine gel directly into her wounds helps for a short while.  Will adjust pain medications.   She feels better after the conversation with her parents yesterday, but is still not quite convinced her father truly supports her wishes.  She wants to keep pushing to live longer for them.  She is comfortable with DNR/DNI.  Subjective: Length of Stay: 10  Current Medications: Scheduled Meds:  . acetaminophen  500 mg Oral Q8H  . calcitonin (salmon)  1 spray Alternating Nares Daily  . cinacalcet  90 mg Oral Q supper  . darbepoetin (ARANESP) injection - DIALYSIS  60 mcg Intravenous Q Fri-HD  . diphenhydrAMINE  25 mg Oral Q M,W,F-HD  . enoxaparin (LOVENOX) injection  30 mg Subcutaneous Q24H  . feeding supplement (NEPRO CARB STEADY)  237 mL Oral TID WC  . fentaNYL  75 mcg Transdermal Q72H  . gabapentin  300 mg Oral BID  . lidocaine   Topical TID  . LORazepam  1 mg Oral QHS  . methocarbamol  500 mg Oral Q8H  . multivitamin  1 tablet Oral QHS  . potassium chloride  20 mEq Oral Daily  . sertraline  100 mg Oral Daily  . sodium thiosulfate infusion for calciphylaxis  25 g Intravenous Q M,W,F-HD  . vancomycin  500 mg  Intravenous Q M,W,F-HD    Continuous Infusions:   PRN Meds: acetaminophen, diphenhydrAMINE **OR** [DISCONTINUED] diphenhydrAMINE (BENADRYL) IVPB(SICKLE CELL ONLY), famotidine, HYDROmorphone (DILAUDID) injection, naloxone **AND** sodium chloride flush, ondansetron (ZOFRAN) IV  Physical Exam  Constitutional: She is oriented to person, place, and time.  Young petite female, lying in bed.  pleasant  HENT:  Head: Normocephalic and atraumatic.  Eyes: EOM are  normal. No scleral icterus.  Neck: Neck supple. No thyromegaly present.  Cardiovascular: Regular rhythm.  Exam reveals no gallop and no friction rub.   No murmur heard. Pulmonary/Chest: Effort normal. No respiratory distress.  Abdominal: Soft. She exhibits no distension.  Musculoskeletal: She exhibits no edema.  Neurological: She is alert and oriented to person, place, and time.  Skin: Skin is warm and dry.  Psychiatric: She has a normal mood and affect. Judgment normal.           Vital Signs: BP 101/69 (BP Location: Right Arm)   Pulse 88   Temp 98.7 F (37.1 C) (Oral)   Resp 15   Ht '5\' 3"'  (1.6 m)   Wt 47.9 kg (105 lb 9.6 oz)   SpO2 100%   BMI 18.71 kg/m  SpO2: SpO2: 100 % O2 Device: O2 Device: Not Delivered O2 Flow Rate: O2 Flow Rate (L/min): 0 L/min  Intake/output summary:   Intake/Output Summary (Last 24 hours) at 09/23/16 9381 Last data filed at 09/23/16 8299  Gross per 24 hour  Intake              400 ml  Output             2500 ml  Net            -2100 ml   LBM: Last BM Date: 09/22/16 Baseline Weight: Weight: 51.3 kg (113 lb) Most recent weight: Weight: 47.9 kg (105 lb 9.6 oz)       Palliative Assessment/Data:    Flowsheet Rows   Flowsheet Row Most Recent Value  Intake Tab  Referral Department  Hospitalist  Unit at Time of Referral  Med/Surg Unit  Palliative Care Primary Diagnosis  Nephrology  Date Notified  09/14/16  Palliative Care Type  New Palliative care  Reason for referral  Clarify Goals of  Care, Pain, Psychosocial or Spiritual support  Date of Admission  09/13/16  Date first seen by Palliative Care  09/16/16  # of days Palliative referral response time  2 Day(s)  # of days IP prior to Palliative referral  1  Clinical Assessment  Palliative Performance Scale Score  40%  Pain Max last 24 hours  10  Pain Min Last 24 hours  4  Dyspnea Max Last 24 Hours  0  Dyspnea Min Last 24 hours  0  Nausea Max Last 24 Hours  0  Nausea Min Last 24 Hours  0  Anxiety Max Last 24 Hours  5  Anxiety Min Last 24 Hours  2  Psychosocial & Spiritual Assessment  Palliative Care Outcomes  Patient/Family meeting held?  Yes  Who was at the meeting?  pt  Palliative Care Outcomes  Improved pain interventions, Provided advance care planning  Palliative Care follow-up planned  Yes, Facility      Patient Active Problem List   Diagnosis Date Noted  . DNR (do not resuscitate)   . Pain of right lower extremity   . Palliative care by specialist   . Advance care planning   . Goals of care, counseling/discussion   . 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 Palliative care encounter   . Edema of right lower extremity 09/14/2016  . Leg edema, right 09/14/2016  . Cellulitis 09/13/2016  . Generalized anxiety disorder 09/05/2016  . Post-operative pain   . Labile blood pressure   . Closed nondisplaced fracture of pelvis ([place])   . Other osteoporosis with current pathological fracture   . Recurrent major depressive disorder, in partial remission (  [place])   . Acute blood loss anemia   . 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 Anemia of chronic disease   . H/O medication noncompliance   . 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 Acute on chronic systolic CHF (congestive heart failure) ([place]) 08/16/2016  . Noncompliance of patient with renal dialysis ([place]) 08/12/2016  . Hyperparathyroidism due to end stage renal disease on dialysis ([place]) 08/01/2016  . Osteoporosis 08/01/2016  . Thoracic compression fracture  - T7, T8, T12 07/27/2016  . Acute pulmonary edema ([place]) 07/26/2016  . Essential hypertension 06/17/2016  . Depression with anxiety 06/17/2016  . Volume overload 04/14/2016  . Peripheral edema 04/14/2016  . NICM (nonischemic cardiomyopathy) ([place]) 04/14/2016  . Internal jugular (IJ) vein thromboembolism, acute ([place]) 04/14/2016  . Pericardial effusion 04/14/2016  . Chronic anticoagulation   . Hypertensive urgency 04/11/2016  . DVT (deep venous thrombosis), left 04/11/2016  . Cellulitis of left upper extremity 03/31/2016  . Chronic pain syndrome 02/29/2016  . Chronic systolic heart failure ([place]) 01/04/2016  . AICD (automatic cardioverter/defibrillator) present 01/04/2016  . 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 Pathologic pelvic fracture 01/04/2016  . Depression 01/04/2016  . Pain in the chest   . ESRD on dialysis ([place])   . Tachycardia   . Elevated serum hCG   . Chest pain 10/11/2015  . Fluid overload 10/10/2015  . Hypertension 10/10/2015  . Anemia of chronic renal failure, stage 5 (H[facility]) 10/04/2015  . Thrombocytopenia ([place]) 10/04/2015  . Chest pain of uncertain etiology 95/62/1308  . Chronically Elevated troponin 03/03/2015  . Hyperkalemia 02/21/2015  . Acute on chronic systolic heart failure ([place]) 02/21/2012  . Pulmonary infarct ([place]) 02/07/2012  . ESRD (end stage renal disease) on dialysis ([place]) 02/03/2012  . PE (pulmonary embolism) 02/03/2012    Palliative Care Assessment & Plan   Patient Profile: 25 y.o. 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 Patient has  been in the hospital 13 times in 2017. Patient has now acute on chronic pain secondary to recent compression fractures at levels T7, T8, T12 as well as pelvis. Patient also went to the OR on 09/15/2016 for skin graft revision on the right. She is verbalizing back pain for the past 2 months and severe right leg pain 1 week. She states the pain is been so severe that she is only walking a few steps in her apartment. Her boyfriend comes over and does her dishes and cooking. She does not have a car and this is made it difficult for her to be compliant with some of her appointments as well as her worsening pain. She states her parents are very actively involved in her life and she describes a close relationship with them.   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  A family with confusion regarding the transplant process, as well as an understanding of whether or not her other issues would resolve with a new kidney.  I believe if [name] and her family had accurate clarification of these issues would help them in their decision making process  Recommendations/Plan:  Increase fentanyl to 75 mcg after discussion with Dr.  Anwar.   Continue PRN IV dilaudid for today.   PMT will reassess pain medications on 12/3.  Request Chaplain Service to assist with notarizing Living Will  Code status is DNR.  Request that Nephrology clarify family's questions regarding transplant and whether or not her other medical issues (heart failure) will resolve if she receives a new kidney.  Goals of Care and Additional Recommendations:  Limitations on Scope of Treatment: Full Scope Treatment with DNR/DNI  Code Status:  DNR  Prognosis:   < 12 months  Discharge Planning:  To Be Determined  Planning for home with home health and palliative.  Care plan was discussed with Dr. Drue Flirt - bedside RN, Family.  Thank you for allowing the Palliative Medicine Team to assist in the care of this  patient.   Time In: 8:00 Time Out: 8:25 Total Time 25 min Prolonged Time Billed no      Greater than 50%  of this time was spent counseling and coordinating care related to the above assessment and plan.  Imogene Burn, PA-C Palliative Medicine   Please contact Palliative MedicineTeam phone at [phone] for questions and concerns.   Please see AMION for individual provider pager numbers.

## 2016-09-24 LAB — RENAL FUNCTION PANEL
ANION GAP: 14 (ref 5–15)
Albumin: 2.4 g/dL — ABNORMAL LOW (ref 3.5–5.0)
BUN: 43 mg/dL — ABNORMAL HIGH (ref 6–20)
CALCIUM: 7.2 mg/dL — AB (ref 8.9–10.3)
CHLORIDE: 103 mmol/L (ref 101–111)
CO2: 26 mmol/L (ref 22–32)
CREATININE: 4.87 mg/dL — AB (ref 0.44–1.00)
GFR calc non Af Amer: 11 mL/min — ABNORMAL LOW (ref >60)
GFR, EST AFRICAN AMERICAN: 13 mL/min — AB (ref >60)
GLUCOSE: 80 mg/dL (ref 65–99)
Phosphorus: 3.6 mg/dL (ref 2.5–4.6)
Potassium: 4.6 mmol/L (ref 3.5–5.1)
SODIUM: 143 mmol/L (ref 135–145)

## 2016-09-24 MED ORDER — FENTANYL 50 MCG/HR TD PT72
100.0000 ug | MEDICATED_PATCH | TRANSDERMAL | Status: DC
  Administered 2016-09-24: 100 ug via TRANSDERMAL
  Filled 2016-09-24: qty 2

## 2016-09-24 MED ORDER — OXYCODONE HCL 5 MG PO TABS: 10.0000 mg | ORAL_TABLET | ORAL | Status: DC | PRN

## 2016-09-24 MED ORDER — SENNA 8.6 MG PO TABS
2.0000 | ORAL_TABLET | Freq: Every day | ORAL | Status: DC
  Administered 2016-09-26 – 2016-09-27 (×2): 17.2 mg via ORAL
  Filled 2016-09-24 (×3): qty 2

## 2016-09-24 MED ORDER — OXYCODONE HCL 5 MG PO TABS
20.0000 mg | ORAL_TABLET | ORAL | Status: DC | PRN
  Administered 2016-09-24: 20 mg via ORAL
  Filled 2016-09-24 (×2): qty 4

## 2016-09-24 NOTE — Progress Notes (Signed)
CSW acknowledges consult to assess pt for residential hospice, Per RNCM notes pt will go home with [facility]. No other SW interventions needed at this time.  CSW is signing off.  Roth Ress B. Gean Quint Clinical Social Work Dept Weekend Social Worker [phone] 4:13 PM

## 2016-09-24 NOTE — Care Management Note (Signed)
Case Management Note  Patient Details  Name: [name] Small MRN: [id number] Date of Birth: 05/26/1991  Subjective/Objective:            ESRD on HD M/W/F  Has had 7 hospitalization in the past 6 months.      CM Action: CM met with patient to discuss [facility] services. CM discussed recommendations for Palliative Services, HH RN, PT,OT, patient is agreeable. Offered choice selected [facility], Patient is now a DNR. CM called [place] liaison J[facility] referral was accepted, and faxed Palliative referral to [facility] 336 [phone]. Patient verbalized understanding. No additional questions or concerns verbalized   Expected Discharge Date:    09/25/16              Expected Discharge Plan:  [place]  In-House Referral:  Clinical Social Work  Discharge planning Services  CM Consult  Post Acute Care Choice:  [place] (Home with [facility]) Choice offered to:  Patient  DME Arranged:  N/A DME Agency:     HH Arranged:  RN, PT, Nurse's Aide, OT, Disease Management [place] Agency:  [facility] (Palliative Services)  Status of Service:  Completed, signed off  If discussed at [facility] of Stay Meetings, dates discussed:    Additional CommentsLaurena Slimmer, RN 09/24/2016, 4:14 PM

## 2016-09-24 NOTE — Progress Notes (Signed)
Subjective:PO  and iv Pain meds needed for pain control (iv when Leg dressing  Done) HD for am / noted Palliative care notes now DNR   Objective Vital signs in last 24 hours: Vitals:   09/23/16 1101 09/23/16 1700 09/23/16 2136 09/24/16 0608  BP: 121/73 117/69 119/75 120/76  Pulse: 96 100 (!) 58 (!) 44  Resp: 16 16 16 16   Temp:  99 F (37.2 C) 98.5 F (36.9 C) 98.2 F (36.8 C)  TempSrc:  Oral Oral Oral  SpO2:  100% 100% 100%  Weight:   48.2 kg (106 lb 4.8 oz)   Height:   5\' 3"  (1.6 m)    Weight change: -2.383 kg (-5 lb 4 oz)  Physical Exam: General: NAD, Young/ petite AAF chronically ill appearance / O X4 Heart: RRR Lungs:  CTA /no rales Abdomen:soft, NT, ND Extremities: no sig LE edema; tender to slight touch; scattered areas dressed with cushion pads/right thigh wrapped. Dialysis Access: left fem. Perm cath nontender dressing clean /dry  Dialysis Orders: MWF 4h Opiflux 180 BFR 400/800 2K/2Ca EDW 49.5kg left fem TDC Heparin 2000 U IV Bolus q HD  Hectorol 1mcg IV q HD  Aranesp 60 mcg IV q week (not given yet) 2.8<1.9  Problem/Plan: 1. SPremoval right thigh graft 11/24- removed because of skin necrosis which appears to be also calciphylaxis-related.  Biopsy showed necrotic tissue only. 2. ESRD-MWF HD / regular diet / K 4.6 /  po 20meq  KCl to keep K up given need for low Ca bath( sec Calciphylaxis TX) / also requires  benadryl 25 mg given with HD to help with itching. 3. Anemia- hgb 8.8  pre HD 12/01  -Aranesp 60 given 11/24; last tsat 11% 10/27 - Fe studies 12/01 =28% thus weekly 100mg  venofer  On wed hd   4. 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 Started Na thio TIW with HD, hold all vit D/ Ca products. Cont sensipar and non Ca binders. Use low Ca bath thus needing  K+supplements. Goal corr Ca low range of normal ~8-8.5.  This am correc Ca = 8.5 with Phos 3.6 prior <2.8<1.9   resstart  Fosrenol 1gm ac  =eating about 100% regular diet now ( needs with decr alb and decr K) 5. Sec HPTH- ipTH 2 ,569 On 08/30/16 - P now 1.9 >2.8 yest.= holding  fosrenol. Liberalized diet so there may be more P; Have stopped VDRA; on sensipar 90 q supper (having some nusea with it but  Trying to take ) /2 Ca bath/calcitonin q day  6. Hypotension/volume- now below edw  With wt yest 48.2   ( prior edw 49.5 ) / bp ok 120/76  This am / hd in am attempr 2 l  7. Nutrition- alb 2.4, liberalized diet  Sec issues as above noted  8. Symptom management- Palliative care has seen with recommendations for care outlined. Appreciate their involvement. Discussed transitioning to PO meds/ STILL Needs IV  Pain meds with Dressing changes  To legs  And now DNR 9. NICM s/p AICD - EF 20-25% 06/19/2016 10. Noncompliance Issues with attending OP HD = she states she has transportation  Arranged to pick her up at home to op center/ AGAIN  reinforced adherence with hd when dc   Lenny Pastelavid Zeyfang, PA-C [facility] [phone] 09/24/2016,8:22 AM  LOS: 11 days   Pt seen, examined and agree w A/P as above.  Rob  Bell Cai MD [facility] pager [phone]   09/24/2016, 11:28 AM    Labs: Basic Metabolic Panel:  Recent Labs Lab 09/21/16 0616 09/22/16 1330 09/24/16 0530  NA 138 144 143  K 3.8 4.9 4.6  CL 97* 106 103  CO2 24 21* 26  GLUCOSE 85 119* 80  BUN 20 47* 43*  CREATININE 3.68* 5.63* 4.87*  CALCIUM 7.3* 7.2* 7.2*  PHOS 1.9* 2.8 3.6   Liver Function Tests:  Recent Labs Lab 09/21/16 0616 09/22/16 1330 09/24/16 0530  ALBUMIN 2.2* 2.2* 2.4*   No results for input(s): LIPASE, AMYLASE in the last 168 hours. No results for input(s): AMMONIA in the last 168 hours. CBC:  Recent Labs Lab 09/18/16 0636 09/19/16 0420 09/20/16 0639 09/21/16 0616 09/22/16 1330  WBC 13.7* 15.7* 13.9* 15.1* 14.7*  NEUTROABS 10.8*  --   --   --   --   HGB 9.3* 9.3* 8.8* 9.6*  8.8*  HCT 30.7* 30.3* 28.8* 31.2* 28.3*  MCV 89.8 89.9 87.8 88.1 87.6  PLT 380 418* 424* 419* 399   Cardiac Enzymes: No results for input(s): CKTOTAL, CKMB, CKMBINDEX, TROPONINI in the last 168 hours. CBG:  Recent Labs Lab 09/17/16 1207 09/17/16 1649 09/17/16 2030  GLUCAP 106* 99 90    Studies/Results: No results found. Medications:  . acetaminophen  500 mg Oral Q8H  . calcitonin (salmon)  1 spray Alternating Nares Daily  . cinacalcet  90 mg Oral Q supper  . darbepoetin (ARANESP) injection - DIALYSIS  60 mcg Intravenous Q Fri-HD  . diphenhydrAMINE  25 mg Oral Q M,W,F-HD  . enoxaparin (LOVENOX) injection  30 mg Subcutaneous Q24H  . feeding supplement (NEPRO CARB STEADY)  237 mL Oral TID WC  . fentaNYL  75 mcg Transdermal Q72H  . [START ON 09/27/2016] ferric gluconate (FERRLECIT/NULECIT) IV  125 mg Intravenous Q Wed-HD  . gabapentin  300 mg Oral BID  . lidocaine   Topical TID  . LORazepam  1 mg Oral QHS  . methocarbamol  500 mg Oral Q8H  . multivitamin  1 tablet Oral QHS  . potassium chloride  20 mEq Oral Daily  . sertraline  100 mg Oral Daily  . sodium thiosulfate infusion for calciphylaxis  25 g Intravenous Q M,W,F-HD

## 2016-09-24 NOTE — Progress Notes (Signed)
Called provider on call for Palliative Medicine Team and spoke with Linna Darner regarding concerns with current PRN pain medication and frequency (fentanyl, Oxycodone Ir, and Dilaudid). Verbal order received to decrease Oxycodone from 20mg  dose to 10mg  dose for frequency of every 2 hours as needed.  MD also ordered that dilaudid can been given q4, however, must wait 4 hours from OxyIR administration.

## 2016-09-24 NOTE — Progress Notes (Addendum)
Daily Progress Note   Patient Name: [name] Small       Date: 09/24/2016 DOB: 04/27/1991  Age: 25 y.o. MRN#: [id number] Attending Physician: Zannie Cove, MD Primary Care Physician: Jeanann Lewandowsky, MD Admit Date: 09/13/2016  Reason for Consultation/Follow-up: Establishing goals of care and Pain control  Subjective: Evaluated patient at bedside. She continues to have pain that is inhibiting her ambulation and function. Noted she is declining scheduled Tylenol. Notes slight decrease in baseline pain, tolerable with IV dilaudid, but pain flares again around 2 hrs after prn administration.  She feels good and is thankful for palliative meeting held on Friday. She feels much support from her parents. She has a goal of being able to ambulate with her walker.    Review of Systems  Constitutional: Positive for malaise/fatigue and weight loss. Negative for fever.  Respiratory: Positive for shortness of breath.   Cardiovascular: Negative.   Gastrointestinal: Negative.   Musculoskeletal: Positive for back pain, joint pain and myalgias.  Neurological: Positive for weakness.  Endo/Heme/Allergies: Negative.   Psychiatric/Behavioral: Positive for depression.  All other systems reviewed and are negative.   Length of Stay: 11  Current Medications: Scheduled Meds:  . acetaminophen  500 mg Oral Q8H  . calcitonin (salmon)  1 spray Alternating Nares Daily  . cinacalcet  90 mg Oral Q supper  . darbepoetin (ARANESP) injection - DIALYSIS  60 mcg Intravenous Q Fri-HD  . diphenhydrAMINE  25 mg Oral Q M,W,F-HD  . enoxaparin (LOVENOX) injection  30 mg Subcutaneous Q24H  . feeding supplement (NEPRO CARB STEADY)  237 mL Oral TID WC  . fentaNYL  100 mcg Transdermal Q72H  . [START ON 09/27/2016] ferric  gluconate (FERRLECIT/NULECIT) IV  125 mg Intravenous Q Wed-HD  . gabapentin  300 mg Oral BID  . lidocaine   Topical TID  . LORazepam  1 mg Oral QHS  . methocarbamol  500 mg Oral Q8H  . multivitamin  1 tablet Oral QHS  . potassium chloride  20 mEq Oral Daily  . sertraline  100 mg Oral Daily  . sodium thiosulfate infusion for calciphylaxis  25 g Intravenous Q M,W,F-HD    Continuous Infusions:   PRN Meds: acetaminophen, diphenhydrAMINE **OR** [DISCONTINUED] diphenhydrAMINE (BENADRYL) IVPB(SICKLE CELL ONLY), famotidine, HYDROmorphone (DILAUDID) injection, naloxone **AND** sodium chloride flush, ondansetron (ZOFRAN) IV, oxyCODONE  Physical Exam  Constitutional: She appears well-developed.  Cardiovascular: Regular rhythm.   Tachycardic   Pulmonary/Chest: Effort normal and breath sounds normal.  Abdominal: Soft. Bowel sounds are normal.  Last BM 12/1  Skin:  RLE wrapped in ace bandage, foam dressing in place over L shin  Psychiatric:  tearful            Vital Signs: BP 99/70 (BP Location: Right Arm)   Pulse 98   Temp 98.1 F (36.7 C) (Oral)   Resp 16   Ht 5\' 3"  (1.6 m)   Wt 48.2 kg (106 lb 4.8 oz)   SpO2 99%   BMI 18.83 kg/m  SpO2: SpO2: 99 % O2 Device: O2 Device: Not Delivered O2 Flow Rate: O2 Flow Rate (L/min): 0 L/min  Intake/output summary:   Intake/Output Summary (Last 24 hours) at 09/24/16 1033 Last data filed at 09/24/16 0805  Gross per 24 hour  Intake              357 ml  Output                0 ml  Net              357 ml   LBM: Last BM Date: 09/23/16 Baseline Weight: Weight: 51.3 kg (113 lb) Most recent weight: Weight: 48.2 kg (106 lb 4.8 oz)       Palliative Assessment/Data: PPS: 50%   Flowsheet Rows   Flowsheet Row Most Recent Value  Intake Tab  Referral Department  Hospitalist  Unit at Time of Referral  Med/Surg Unit  Palliative Care Primary Diagnosis  Nephrology  Date Notified  09/14/16  Palliative Care Type  New Palliative care  Reason  for referral  Clarify Goals of Care, Pain, Psychosocial or Spiritual support  Date of Admission  09/13/16  Date first seen by Palliative Care  09/16/16  # of days Palliative referral response time  2 Day(s)  # of days IP prior to Palliative referral  1  Clinical Assessment  Palliative Performance Scale Score  40%  Pain Max last 24 hours  10  Pain Min Last 24 hours  4  Dyspnea Max Last 24 Hours  0  Dyspnea Min Last 24 hours  0  Nausea Max Last 24 Hours  0  Nausea Min Last 24 Hours  0  Anxiety Max Last 24 Hours  5  Anxiety Min Last 24 Hours  2  Psychosocial & Spiritual Assessment  Palliative Care Outcomes  Patient/Family meeting held?  Yes  Who was at the meeting?  pt  Palliative Care Outcomes  Improved pain interventions, Provided advance care planning  Palliative Care follow-up planned  Yes, Facility      Patient Active Problem List   Diagnosis Date Noted  . DNR (do not resuscitate)   . Pain of right lower extremity   . Palliative care by specialist   . Advance care planning   . Goals of care, counseling/discussion   . 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 Palliative care encounter   . Edema of right lower extremity 09/14/2016  . Leg edema, right 09/14/2016  . Cellulitis 09/13/2016  . Generalized anxiety disorder 09/05/2016  . Post-operative pain   . Labile blood pressure   . Closed nondisplaced fracture of pelvis (HCC)   . Other osteoporosis with current pathological fracture   . Recurrent major depressive disorder, in partial remission (HCC)   . Acute blood loss anemia   . Chronic deep vein thrombosis (DVT) of femoral  vein (HCC)   . Anemia of chronic disease   . H/O medication noncompliance   . 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 Acute on chronic systolic CHF (congestive heart failure) (HCC) 08/16/2016  . Noncompliance of patient with renal dialysis (HCC) 08/12/2016  . 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 Osteoporosis 08/01/2016    . Thoracic compression fracture - T7, T8, T12 07/27/2016  . Acute pulmonary edema (HCC) 07/26/2016  . Essential hypertension 06/17/2016  . Depression with anxiety 06/17/2016  . Volume overload 04/14/2016  . Peripheral edema 04/14/2016  . NICM (nonischemic cardiomyopathy) (HCC) 04/14/2016  . Internal jugular (IJ) vein thromboembolism, acute (HCC) 04/14/2016  . Pericardial effusion 04/14/2016  . Chronic anticoagulation   . Hypertensive urgency 04/11/2016  . DVT (deep venous thrombosis), left 04/11/2016  . Cellulitis of left upper extremity 03/31/2016  . Chronic pain syndrome 02/29/2016  . Chronic systolic heart failure (HCC) 01/04/2016  . AICD (automatic cardioverter/defibrillator) present 01/04/2016  . 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 Pathologic pelvic fracture 01/04/2016  . Depression 01/04/2016  . Pain in the chest   . ESRD on dialysis (HCC)   . Tachycardia   . Elevated serum hCG   . Chest pain 10/11/2015  . Fluid overload 10/10/2015  . Hypertension 10/10/2015  . Anemia of chronic renal failure, stage 5 (HCC) 10/04/2015  . Thrombocytopenia (HCC) 10/04/2015  . Chest pain of uncertain etiology 10/04/2015  . Chronically Elevated troponin 03/03/2015  . Hyperkalemia 02/21/2015  . Acute on chronic systolic heart failure (HCC) 02/21/2012  . Pulmonary infarct (HCC) 02/07/2012  . ESRD (end stage renal disease) on dialysis (HCC) 02/03/2012  . PE (pulmonary embolism) 02/03/2012    Palliative Care Assessment & Plan   Patient Profile: 25 y.o. 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 Patient has been in the hospital 13 times in 2017. Patient has now acute on chronic pain secondary to recent compression fractures at levels T7, T8, T12 as well as pelvis. Patient also went to the OR on 09/15/2016 for skin graft revision on the right. She is verbalizing back pain for the past 2 months and severe right leg pain 1 week. She states the pain is been so severe that she is only walking a few steps in her apartment. Her boyfriend comes over and does her dishes and cooking. She does not have a car and this is made it difficult for her to be compliant with some of her appointments as well as her worsening pain. She states her parents are very actively involved in her life and she describes a close relationship with them. She states her mother does all of her grocery shopping for her.   Assessment/Recommendations/Plan   Pain- based on patient 24 hr requirements of 12mg  prn hydromorphone- will increase Fentanyl patch to 14000mcg/hr. Start po oxycodone 20mg  q2hr prn (based on 10% dosing equalanalgesic of long acting medication). Encouraged to take Tylenol as scheduled d/t research indicates acetaminophen increases effect of opioid analgesia when taken together. Contine IV dilaudid PRN for very severe pain. Premed with Oxycodone IR before attempting to ambulate.  Start senna 2  tabs daily for bowel prophylaxis. Monitor for constipation. Last BM charted is 12/1.  GOC- her current GOC is to stabilize enough to return home. She also wants to be able to ambulate with her walker.  Goals of Care and Additional Recommendations:  Limitations on Scope of Treatment: Full Scope Treatment and DNR status  Code Status:  DNR  Prognosis:   < 3 months based on ESRD with recent calciphylaxis diagnosis  Discharge Planning:  Home with Palliative Services  Care plan was discussed with patient and patient's RN.   Thank you for allowing the Palliative Medicine Team to assist in  the care of this patient.   Time In: 1000 Time Out: 1030 Total Time 30 mins Prolonged Time Billed No      Greater than 50%  of this time was spent counseling and coordinating care related to the above assessment and plan.  Ocie Bob, AGNP-C Palliative Medicine   Please contact Palliative Medicine Team phone at [phone] for questions and concerns.

## 2016-09-24 NOTE — Progress Notes (Signed)
  Progress Note    09/24/2016 10:05 AM 9 Days Post-Op  Subjective:  Remains in pain RLE  Vitals:   09/24/16 0608 09/24/16 0931  BP: 120/76 99/70  Pulse: (!) 44 98  Resp: 16 16  Temp: 98.2 F (36.8 C) 98.1 F (36.7 C)    Physical Exam: aaox3 R thigh dressing cdi, to be changed later Edema mostly resolved  CBC    Component Value Date/Time   WBC 14.7 (H) 09/22/2016 1330   RBC 3.23 (L) 09/22/2016 1330   HGB 8.8 (L) 09/22/2016 1330   HCT 28.3 (L) 09/22/2016 1330   PLT 399 09/22/2016 1330   MCV 87.6 09/22/2016 1330   MCH 27.2 09/22/2016 1330   MCHC 31.1 09/22/2016 1330   RDW 22.8 (H) 09/22/2016 1330   LYMPHSABS 1.6 09/18/2016 0636   MONOABS 1.0 09/18/2016 0636   EOSABS 0.3 09/18/2016 0636   BASOSABS 0.0 09/18/2016 0636    BMET    Component Value Date/Time   NA 143 09/24/2016 0530   K 4.6 09/24/2016 0530   CL 103 09/24/2016 0530   CO2 26 09/24/2016 0530   GLUCOSE 80 09/24/2016 0530   BUN 43 (H) 09/24/2016 0530   CREATININE 4.87 (H) 09/24/2016 0530   CALCIUM 7.2 (L) 09/24/2016 0530   GFRNONAA 11 (L) 09/24/2016 0530   GFRAA 13 (L) 09/24/2016 0530    INR    Component Value Date/Time   INR 1.35 06/17/2016 0229     Intake/Output Summary (Last 24 hours) at 09/24/16 1005 Last data filed at 09/24/16 0805  Gross per 24 hour  Intake              357 ml  Output                0 ml  Net              357 ml     Assessment:  25 y.o. female is excision of right thigh graft with likely calciphylaxis  Plan: Continue local wound care Will follow   Sada Mazzoni C. Randie Heinz, MD [facility] Office: [phone] Pager: [phone]  09/24/2016 10:05 AM

## 2016-09-24 NOTE — Progress Notes (Signed)
PROGRESS NOTE [facility]   [name] Small   WUJ:[id number] DOB: Apr 03, 1991  DOA: 09/13/2016 PCP: Jeanann LewandowskyJEGEDE, OLUGBEMIGA, MD   Brief Narrative:  [name] Small a 25 y.o. 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 Recently hospitalized from 10/22-11/10/17 with T7,T8,T12 vertebral compression fractures and volume overload related to noncompliance with HD. She had a new right femoral AVG placed during admission on 08/31/16. Graft has not been used for HD. Presented to the ER because of increasing swelling of the right lower extremity with ulcerations and blisters. CT scan was done which was showing diffuse edema. Patient has had recent AV graft placed on right lower extremity and gets her dialysis done on the left lower extremity dialysis catheter. On exam patient has eschar over the recent AV graft placement on the right lower extremity with blisters in the lower extremity both left and right.  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 - Palliative care consult appreciated, PCA -stopped  and started Fentanyl patch, Oral and IV narcotics, now DNR, plan to continue HD - ambulate with PT, encouraged ambulation  -Home once able to ambulate and tolerate outpatient HD  Hypotension -  borderline low   -soft, monitor  ESRD on hemodialysis TTS - Electrolyte abnormalities  -Renal recommendations appreciated -HD per Renal  Nonischemic cardiomyopathy S/P AICD placement  -EF 06/2016 =12% -volume managed with HD  T7,T8,T12 vertebral compression fractures -supportive care, PCA pump per palliative care on 11/28  Chronic anemia - hemoglobin stable at baseline -stable  DVT prophylaxis: Pt refusing all heparin injections: switched to lovenox 11/27 and still refusing, counseled patient about risks including death Code Status: Full Family Communication: none at bedside Disposition Plan: home when able to ambulate, pain controlled, and stable for outpatient HD   Consultants:   Vascular surgery Dr. Randie Heinzain  Nephrology Dr. Briant CedarMattingly  Procedures:  11/22 CT bilateral lower extremity:-No evidence of fracture or dislocation. -. Diffuse soft tissue edema involving right hip/entirety of right lower extremity, with large 5.4 cm fluid-filled blister along the anterolateral right lower leg. Multiple smaller blisters along the medial right thigh. Negative abscess - Small bilateral knee joint effusions, Rt>> Lt   Antimicrobials:  Zosyn 11/22  Vancomycin 11/22  Objective: Vitals:   09/23/16 1700 09/23/16 2136 09/24/16 0608 09/24/16 0931  BP: 117/69 119/75 120/76 99/70  Pulse: 100 (!) 58 (!) 44 98  Resp: 16 16 16 16   Temp: 99 F (37.2 C) 98.5 F (36.9 C) 98.2 F (36.8 C) 98.1 F (36.7 C)  TempSrc: Oral Oral Oral Oral  SpO2: 100% 100% 100% 99%  Weight:  48.2 kg (106 lb 4.8 oz)    Height:  5\' 3"  (1.6 m)      Intake/Output Summary (Last 24 hours) at 09/24/16 1133 Last data filed at 09/24/16 0805  Gross per 24 hour  Intake  357 ml  Output                0 ml  Net              357 ml   Filed Weights   09/22/16 1811 09/22/16 2203 09/23/16 2136  Weight: 47.8 kg (105 lb 6.1 oz) 47.9 kg (105 lb 9.6 oz) 48.2 kg (106 lb 4.8 oz)   Examination:  General exam:  AAOx3, chronically ill appearing Respiratory system: Clear to auscultation.  Cardiovascular system: S1 & S2 heard, RRR.  Extremities: Range of movement continues to improve. Right lower extremity with edema decreasing. Penrose on R thigh    Skin: Blister in legs/ extremities. Fistula in the left arm. R thigh with multiple wounds, some deep without active purulence Psychiatry: Withdrawn and flat affect  Data Reviewed: I have personally reviewed following labs and imaging studies  CBC:  Recent Labs Lab 09/18/16 0636 09/19/16 0420 09/20/16 0639 09/21/16 0616 09/22/16 1330  WBC 13.7* 15.7* 13.9* 15.1* 14.7*  NEUTROABS 10.8*  --   --   --   --   HGB 9.3* 9.3* 8.8* 9.6* 8.8*  HCT 30.7* 30.3* 28.8* 31.2* 28.3*  MCV 89.8 89.9 87.8 88.1 87.6  PLT 380 418* 424* 419* 399   Basic Metabolic Panel:  Recent Labs Lab 09/19/16 0420 09/20/16 0639 09/21/16 0616 09/22/16 1330 09/24/16 0530  NA 142 142 138 144 143  K 3.8 4.9 3.8 4.9 4.6  CL 101 102 97* 106 103  CO2 23 18* 24 21* 26  GLUCOSE 75 73 85 119* 80  BUN 85* 41* 20 47* 43*  CREATININE 8.87* 5.90* 3.68* 5.63* 4.87*  CALCIUM 8.0* 8.0* 7.3* 7.2* 7.2*  PHOS 3.3 2.5 1.9* 2.8 3.6   GFR: Estimated Creatinine Clearance: 13.4 mL/min (by C-G formula based on SCr of 4.87 mg/dL (H)). Liver Function Tests:  Recent Labs Lab 09/19/16 0420 09/20/16 0639 09/21/16 0616 09/22/16 1330 09/24/16 0530  ALBUMIN 2.2* 2.4* 2.2* 2.2* 2.4*   No results for input(s): LIPASE, AMYLASE in the last 168 hours. No results for input(s): AMMONIA in the last 168 hours. Coagulation Profile: No results for input(s): INR, PROTIME in the last 168 hours. Cardiac Enzymes: No results for input(s): CKTOTAL, CKMB, CKMBINDEX, TROPONINI in the last 168 hours. BNP (last 3 results) No results for input(s): PROBNP in the last 8760 hours. HbA1C: No results for input(s): HGBA1C in the last 72 hours. CBG:  Recent Labs Lab 09/17/16 1207 09/17/16 1649  09/17/16 2030  GLUCAP 106* 99 90   Lipid Profile: No results for input(s): CHOL, HDL, LDLCALC, TRIG, CHOLHDL, LDLDIRECT in the last 72 hours. Thyroid Function Tests: No results for input(s): TSH, T4TOTAL, FREET4, T3FREE, THYROIDAB in the last 72 hours. Anemia Panel:  Recent Labs  09/22/16 1330  FERRITIN 816*  TIBC 134*  IRON 37   Sepsis Labs: No results for input(s): PROCALCITON, LATICACIDVEN in the last 168 hours.  Recent Results (from the past 240 hour(s))  Culture, blood (routine x 2)     Status: None   Collection Time: 09/14/16  3:23 PM  Result Value Ref Range Status   Specimen Description BLOOD RIGHT HAND  Final   Special Requests IN PEDIATRIC BOTTLE [facility]  Final   Culture NO GROWTH 5 DAYS  Final   Report Status 09/19/2016 FINAL  Final  Culture, blood (routine x 2)     Status: None   Collection Time: 09/14/16  3:34 PM  Result Value Ref Range Status  Specimen Description BLOOD RIGHT ARM  Final   Special Requests IN PEDIATRIC BOTTLE 2CC  Final   Culture NO GROWTH 5 DAYS  Final   Report Status 09/19/2016 FINAL  Final  Aerobic/Anaerobic Culture (surgical/deep wound)     Status: None   Collection Time: 09/15/16  5:34 PM  Result Value Ref Range Status   Specimen Description WOUND RIGHT THIGH  Final   Special Requests PATIENT ON FOLLOWING ZOSYN NO ANAEROBIC SWAB SENT  Final   Gram Stain NO WBC SEEN NO ORGANISMS SEEN   Final   Culture   Final    RARE STAPHYLOCOCCUS SPECIES (COAGULASE NEGATIVE) CALL MICROBIOLOGY LAB IF SENSITIVITIES ARE REQUIRED. NO ANAEROBES ISOLATED    Report Status 09/20/2016 FINAL  Final    Radiology Studies: No results found.  Scheduled Meds: . acetaminophen  500 mg Oral Q8H  . calcitonin (salmon)  1 spray Alternating Nares Daily  . cinacalcet  90 mg Oral Q supper  . darbepoetin (ARANESP) injection - DIALYSIS  60 mcg Intravenous Q Fri-HD  . diphenhydrAMINE  25 mg Oral Q M,W,F-HD  . enoxaparin (LOVENOX) injection  30 mg Subcutaneous Q24H  .  feeding supplement (NEPRO CARB STEADY)  237 mL Oral TID WC  . fentaNYL  100 mcg Transdermal Q72H  . [START ON 09/27/2016] ferric gluconate (FERRLECIT/NULECIT) IV  125 mg Intravenous Q Wed-HD  . gabapentin  300 mg Oral BID  . lidocaine   Topical TID  . LORazepam  1 mg Oral QHS  . methocarbamol  500 mg Oral Q8H  . multivitamin  1 tablet Oral QHS  . potassium chloride  20 mEq Oral Daily  . senna  2 tablet Oral Daily  . sertraline  100 mg Oral Daily  . sodium thiosulfate infusion for calciphylaxis  25 g Intravenous Q M,W,F-HD   Continuous Infusions:    LOS: 11 days   Zannie Cove, MD [facility] Pager [phone]  If 7PM-7AM, please contact night-coverage [email or web address] Password [facility] 09/24/2016, 11:33 AM

## 2016-09-25 LAB — CBC
HEMATOCRIT: 29.3 % — AB (ref 36.0–46.0)
Hemoglobin: 8.7 g/dL — ABNORMAL LOW (ref 12.0–15.0)
MCH: 27.1 pg (ref 26.0–34.0)
MCHC: 29.7 g/dL — AB (ref 30.0–36.0)
MCV: 91.3 fL (ref 78.0–100.0)
PLATELETS: 405 10*3/uL — AB (ref 150–400)
RBC: 3.21 MIL/uL — ABNORMAL LOW (ref 3.87–5.11)
RDW: 23.1 % — AB (ref 11.5–15.5)
WBC: 12.9 10*3/uL — AB (ref 4.0–10.5)

## 2016-09-25 LAB — RENAL FUNCTION PANEL
Albumin: 2.7 g/dL — ABNORMAL LOW (ref 3.5–5.0)
Anion gap: 14 (ref 5–15)
BUN: 71 mg/dL — AB (ref 6–20)
CHLORIDE: 105 mmol/L (ref 101–111)
CO2: 22 mmol/L (ref 22–32)
CREATININE: 6.72 mg/dL — AB (ref 0.44–1.00)
Calcium: 7.1 mg/dL — ABNORMAL LOW (ref 8.9–10.3)
GFR calc Af Amer: 9 mL/min — ABNORMAL LOW (ref >60)
GFR, EST NON AFRICAN AMERICAN: 8 mL/min — AB (ref >60)
Glucose, Bld: 93 mg/dL (ref 65–99)
POTASSIUM: 5.6 mmol/L — AB (ref 3.5–5.1)
Phosphorus: 3.7 mg/dL (ref 2.5–4.6)
Sodium: 141 mmol/L (ref 135–145)

## 2016-09-25 MED ORDER — DIPHENHYDRAMINE HCL 50 MG/ML IJ SOLN
25.0000 mg | INTRAMUSCULAR | Status: DC
  Administered 2016-09-25 – 2016-09-27 (×8): 25 mg via INTRAVENOUS
  Filled 2016-09-25 (×8): qty 1

## 2016-09-25 MED ORDER — PRO-STAT SUGAR FREE PO LIQD
30.0000 mL | Freq: Two times a day (BID) | ORAL | Status: DC
  Administered 2016-09-25 – 2016-09-27 (×4): 30 mL via ORAL
  Filled 2016-09-25 (×5): qty 30

## 2016-09-25 MED ORDER — HYDROMORPHONE HCL 1 MG/ML IJ SOLN
INTRAMUSCULAR | Status: AC
  Filled 2016-09-25: qty 1

## 2016-09-25 MED ORDER — OXYCODONE HCL 5 MG PO TABS: 20.0000 mg | ORAL_TABLET | ORAL | Status: DC | PRN

## 2016-09-25 MED ORDER — HYDROMORPHONE HCL 1 MG/ML IJ SOLN
INTRAMUSCULAR | Status: AC
  Administered 2016-09-25: 1 mg via INTRAVENOUS
  Filled 2016-09-25: qty 1

## 2016-09-25 NOTE — Progress Notes (Signed)
[facility] Progress Note   Subjective: sitting up in bed, wearing back brace. C/O pain in LE. Wants IV benedryl    Objective Vitals:   09/24/16 1700 09/24/16 2030 09/25/16 0522 09/25/16 0912  BP: 110/68 (!) 131/92 107/72 104/76  Pulse: 78 (!) 57 89 (!) 53  Resp: 14 16 16 14   Temp: 98.6 F (37 C) 99.2 F (37.3 C) 98.3 F (36.8 C) 97.9 F (36.6 C)  TempSrc: Oral Oral Oral Oral  SpO2: 100% 96% 99% 97%  Weight:      Height:       Physical Exam General: Pleasant, chronically ill appearing female in NAD Heart: S1,S2, 3/6 systolic M. No R/G Lungs: Difficult to hear lung sounds D/T back brace but CTA upper lung fields.  Abdomen: Abd soft, non-tender Extremities: trace BLE. Multiple wounds covered with allevyn dressings. Ace wrap RU thigh Dialysis Access: L fem TDC. Drsg intact.   Dialysis Orders: Dialysis Orders: MWF 4h Opiflux 180 BFR 400/800 2K/2Ca EDW 49.5kg left fem TDC Heparin 2000 U IV Bolus q HD  Hectorol IV q HD  Aranesp 60 mcg IV q week (not given yet)  Additional Objective Labs: Basic Metabolic Panel:  Recent Labs Lab 09/21/16 0616 09/22/16 1330 09/24/16 0530  NA 138 144 143  K 3.8 4.9 4.6  CL 97* 106 103  CO2 24 21* 26  GLUCOSE 85 119* 80  BUN 20 47* 43*  CREATININE 3.68* 5.63* 4.87*  CALCIUM 7.3* 7.2* 7.2*  PHOS 1.9* 2.8 3.6   Liver Function Tests:  Recent Labs Lab 09/21/16 0616 09/22/16 1330 09/24/16 0530  ALBUMIN 2.2* 2.2* 2.4*   No results for input(s): LIPASE, AMYLASE in the last 168 hours. CBC:  Recent Labs Lab 09/19/16 0420 09/20/16 0639 09/21/16 0616 09/22/16 1330  WBC 15.7* 13.9* 15.1* 14.7*  HGB 9.3* 8.8* 9.6* 8.8*  HCT 30.3* 28.8* 31.2* 28.3*  MCV 89.9 87.8 88.1 87.6  PLT 418* 424* 419* 399   Blood Culture    Component Value Date/Time   SDES WOUND RIGHT THIGH 09/15/2016 1734   SPECREQUEST PATIENT ON FOLLOWING ZOSYN NO ANAEROBIC SWAB SENT 09/15/2016 1734   CULT  09/15/2016 1734    RARE  STAPHYLOCOCCUS SPECIES (COAGULASE NEGATIVE) CALL MICROBIOLOGY LAB IF SENSITIVITIES ARE REQUIRED. NO ANAEROBES ISOLATED    REPTSTATUS 09/20/2016 FINAL 09/15/2016 1734    Cardiac Enzymes: No results for input(s): CKTOTAL, CKMB, CKMBINDEX, TROPONINI in the last 168 hours. CBG: No results for input(s): GLUCAP in the last 168 hours. Iron Studies:  Recent Labs  09/22/16 1330  IRON 37  TIBC 134*  FERRITIN 816*   @lablastinr3 @ Studies/Results: No results found. Medications:  . acetaminophen  500 mg Oral Q8H  . calcitonin (salmon)  1 spray Alternating Nares Daily  . cinacalcet  90 mg Oral Q supper  . darbepoetin (ARANESP) injection - DIALYSIS  60 mcg Intravenous Q Fri-HD  . diphenhydrAMINE  25 mg Oral Q M,W,F-HD  . enoxaparin (LOVENOX) injection  30 mg Subcutaneous Q24H  . feeding supplement (NEPRO CARB STEADY)  237 mL Oral TID WC  . fentaNYL  100 mcg Transdermal Q72H  . [START ON 09/27/2016] ferric gluconate (FERRLECIT/NULECIT) IV  125 mg Intravenous Q Wed-HD  . gabapentin  300 mg Oral BID  . lidocaine   Topical TID  . LORazepam  1 mg Oral QHS  . methocarbamol  500 mg Oral Q8H  . multivitamin  1 tablet Oral QHS  . potassium chloride  20 mEq Oral Daily  .  senna  2 tablet Oral Daily  . sertraline  100 mg Oral Daily  . sodium thiosulfate infusion for calciphylaxis  25 g Intravenous Q M,W,F-HD     Assessment/Plan: 1. SPremoval right thigh graft 11/24- removed because of skin necrosis which appears to be also calciphylaxis-related.  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 2. ESRD-MWF HP. For HD today 3. Anemia- hgb 8.8  pre HD 12/01 -Aranesp 60 given 11/24; last tsat 11% 10/27 - Fe studies 12/01 =28% thus weekly 100mg  venofer  On wed hd  4. 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 Started Na thio TIW with HD, hold all vit D/ Ca products.  Cont sensipar and non Ca binders. Use low Ca bath. Goal corr Ca low range of normal ~8-8.5. Last Ca 7.2 C Ca 8.5 09/24/13 5.Sec HPTH- PTH 2 ,569  08/30/16 - Phos 3.6. Binders on hold. Stopped VDRA; on sensipar 90 mg q day. On calcitonin nasal spray daily. No binder right now because phos is good  6. Hypotension/volume- Last HD 09/22/16 Left under EDW 47.8 kg. For HD today, UFG 2 liters. Will need EDW reworking at discharge 7. Nutrition- alb 2.4, liberalized to regular diet. Start Prostat.    8. Symptom management- Palliative care has seen with recommendations for care outlined. Appreciate their involvement. Discussed transitioning to PO meds/ STILL Needs IV Pain meds with Dressing changes To legs  And now DNR 9. NICM s/p AICD - EF 20-25% 06/19/2016 10. Hypokalemia: K+ 4.6 has been getting KDur 20 meq daily. On regular diet. Watch K+. 10. Dispo-  Home with hospice services but to continue with HD  Rita H. Brown NP-C 09/25/2016, 10:46 AM   [facility] [phone]  Patient seen and examined, agree with above note with above modifications. Pt on treatment for calciphylaxis- tobe going home with hospice services as soon as she can tolerate pills only- talked to Mother - will just need to see how does with that arrangement- prognosis poor but will continue with HD as able  Annie SableKellie Damali Broadfoot, MD 09/25/2016

## 2016-09-25 NOTE — Progress Notes (Signed)
  Progress Note    09/25/2016 9:27 AM 10 Days Post-Op  Subjective:  Still having right thigh pain  Vitals:   09/25/16 0522 09/25/16 0912  BP: 107/72 104/76  Pulse: 89 (!) 53  Resp: 16 14  Temp: 98.3 F (36.8 C) 97.9 F (36.6 C)    Physical Exam: aaox3 Dressings cdi   CBC    Component Value Date/Time   WBC 14.7 (H) 09/22/2016 1330   RBC 3.23 (L) 09/22/2016 1330   HGB 8.8 (L) 09/22/2016 1330   HCT 28.3 (L) 09/22/2016 1330   PLT 399 09/22/2016 1330   MCV 87.6 09/22/2016 1330   MCH 27.2 09/22/2016 1330   MCHC 31.1 09/22/2016 1330   RDW 22.8 (H) 09/22/2016 1330   LYMPHSABS 1.6 09/18/2016 0636   MONOABS 1.0 09/18/2016 0636   EOSABS 0.3 09/18/2016 0636   BASOSABS 0.0 09/18/2016 0636    BMET    Component Value Date/Time   NA 143 09/24/2016 0530   K 4.6 09/24/2016 0530   CL 103 09/24/2016 0530   CO2 26 09/24/2016 0530   GLUCOSE 80 09/24/2016 0530   BUN 43 (H) 09/24/2016 0530   CREATININE 4.87 (H) 09/24/2016 0530   CALCIUM 7.2 (L) 09/24/2016 0530   GFRNONAA 11 (L) 09/24/2016 0530   GFRAA 13 (L) 09/24/2016 0530    INR    Component Value Date/Time   INR 1.35 06/17/2016 0229     Intake/Output Summary (Last 24 hours) at 09/25/16 5038 Last data filed at 09/25/16 0912  Gross per 24 hour  Intake              999 ml  Output                0 ml  Net              999 ml     Assessment:  25 y.o. female is s/p right thigh graft excision with calciphylaxis  Plan: Will need local wound care at home Dialysis via left femoral catheter Can f/u with [facility] on prn basis or with concerns about wounds   Morgen Ritacco C. Randie Heinz, MD [facility] Office: [phone] Pager: [phone]  09/25/2016 9:27 AM

## 2016-09-25 NOTE — Progress Notes (Signed)
Physical Therapy Treatment Patient Details Name: [name] Small MRN: [id number] DOB: 08/30/1991 Today's Date: 09/25/2016    History of Present Illness [name] Small is a 25 y.o. female with ESRD secondary to polycystic kidney disease, chronic systolic heart failure status post AICD placement last EF measured in September 2017 was 12% and recently has had T-spine fractures presents to the ER because of increasing swelling of the right lower extremity with ulcerations and blisters.     PT Comments    Pt continues to be very limited by BLE pain, could tolerate only 3' ambulation fwd and back and was crying through this. To go home she needs to be able to get to and from HD transportation on her own. Parents and boyfriend can stay with her at night but they work in the day. At this point, PT recommending return to CIR, though pt is not agreeable to this at this time, she wants to go home. PT will continue to follow.   Follow Up Recommendations  CIR, though pt wanting to go straight home so if she refuses CIR, would recommend HHPT, w/c for getting to and from HD transport, and HHaide for assist with meals and IADLs.      Equipment Recommendations  Wheelchair (measurements PT)    Recommendations for Other Services Rehab consult     Precautions / Restrictions Precautions Precautions: Fall;Back Precaution Booklet Issued: No Precaution Comments: Patient able to state precautions. Required Braces or Orthoses: Spinal Brace Spinal Brace: Thoracolumbosacral orthotic Restrictions Weight Bearing Restrictions: No RUE Weight Bearing: Weight bearing as tolerated LUE Weight Bearing: Weight bearing as tolerated RLE Weight Bearing: Weight bearing as tolerated LLE Weight Bearing: Weight bearing as tolerated    Mobility  Bed Mobility Overal bed mobility: Needs Assistance Bed Mobility: Supine to Sit;Sit to Supine     Supine to sit: Supervision;HOB elevated Sit to supine: Supervision;HOB  elevated   General bed mobility comments: pt required increased time and process was painful but she was able to get self from supine to EOB as well as back to supine from sitting  Transfers Overall transfer level: Needs assistance Equipment used: Rolling walker (2 wheeled) Transfers: Sit to/from Stand Sit to Stand: Min assist         General transfer comment: min A for power up and to steady  Ambulation/Gait Ambulation/Gait assistance: Min assist Ambulation Distance (Feet): 3 Feet Assistive device: Rolling walker (2 wheeled) Gait Pattern/deviations: Decreased stride length Gait velocity: very slow Gait velocity interpretation: <1.8 ft/sec, indicative of risk for recurrent falls General Gait Details: pt crying during ambulation due to pain, ambulated 3' fwd with RW and could go no further, 3' bkwd to bed.    Stairs            Wheelchair Mobility    Modified Rankin (Stroke Patients Only)       Balance Overall balance assessment: Needs assistance Sitting-balance support: No upper extremity supported Sitting balance-Leahy Scale: Good Sitting balance - Comments: able to don brace in sitting as well as cross one leg over to don a sock, however, this caused increased pain and other sock was donned for her   Standing balance support: Bilateral upper extremity supported Standing balance-Leahy Scale: Poor                      Cognition Arousal/Alertness: Awake/alert Behavior During Therapy: WFL for tasks assessed/performed Overall Cognitive Status: Within Functional Limits for tasks assessed  Exercises      General Comments General comments (skin integrity, edema, etc.): pt motivated to get up and mobilize and really wants to go home but is very limited by excruciating pain BLE's      Pertinent Vitals/Pain Pain Assessment: 0-10 Pain Score: 7  Pain Location: BLE's Pain Descriptors / Indicators: Burning;Aching;Constant Pain  Intervention(s): Limited activity within patient's tolerance;Monitored during session    Home Living                      Prior Function            PT Goals (current goals can now be found in the care plan section) Acute Rehab PT Goals Patient Stated Goal: decreased pain PT Goal Formulation: With patient Time For Goal Achievement: 10/05/16 Potential to Achieve Goals: Fair Progress towards PT goals: Not progressing toward goals - comment (pain)    Frequency    Min 3X/week      PT Plan Discharge plan needs to be updated    Small-evaluation             End of Session Equipment Utilized During Treatment: Gait belt;Back brace Activity Tolerance: Patient limited by pain Patient left: in bed;with call bell/phone within reach     Time: 1224-1253 PT Time Calculation (min) (ACUTE ONLY): 29 min  Charges:  $Gait Training: 8-22 mins $Therapeutic Activity: 8-22 mins                    G Codes:     [name] Small, PT  Acute Rehab Services  [phone]  [name] Small 09/25/2016, 1:46 PM

## 2016-09-25 NOTE — Procedures (Signed)
Patient was seen on dialysis and the procedure was supervised.  BFR 400  Via PC BP is  114/80.   Patient appears to be tolerating treatment well  Shia Eber A 09/25/2016

## 2016-09-25 NOTE — Progress Notes (Signed)
Inpatient Rehabilitation  PT is recommending IP Rehab, however notes pt. does not want to come to CIR.  At this time, we are not recommending IP Rehab consult.  Can consider consult if pt. becomes more amenable to the possibility of CIR.  Please call if questions.  Weldon Picking PT Inpatient Rehab Admissions Coordinator Cell [phone] Office [phone]

## 2016-09-25 NOTE — Progress Notes (Signed)
PROGRESS NOTE [facility]   [place]   ZOX:[id number] DOB: 07-01-1991  DOA: 09/13/2016 PCP: Jeanann Lewandowsky, MD   Brief Narrative:  [name] Small a 25 y.o. 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 Recently hospitalized from 10/22-11/10/17 with T7,T8,T12 vertebral compression fractures and volume overload related to noncompliance with HD. She had a new right femoral AVG placed during admission on 08/31/16. Graft has not been used for HD. Presented to the ER because of increasing swelling of the right lower extremity with ulcerations and blisters. CT scan was done which was showing diffuse edema. Patient has had recent AV graft placed on right lower extremity and gets her dialysis done on the left lower extremity dialysis catheter. On exam patient has eschar over the recent AV graft placement on the right lower extremity with blisters in the lower extremity both left and right.  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 - Palliative care consult appreciated, PCA -stopped  and started Fentanyl patch, Oral and IV narcotics, now DNR, plan to continue HD - ambulate with PT, encouraged ambulation, not seen by PT over weekend -Home once able to ambulate and  tolerate outpatient HD -wound care  Hypotension - borderline low   -soft, monitor  ESRD on hemodialysis TTS - Electrolyte abnormalities  -Renal recommendations appreciated -HD per Renal  Nonischemic cardiomyopathy S/P AICD placement  -EF 06/2016 =12% -volume managed with HD  W1,X9,J47 vertebral compression fractures -supportive care, PCA pump per palliative care on 11/28  Chronic anemia - hemoglobin stable at baseline -stable  DVT prophylaxis: Pt refusing all heparin injections: switched to lovenox 11/27 and still refusing, counseled patient about risks including death Code Status: Full Family Communication: none at bedside Disposition Plan: home when able to ambulate, pain controlled, and stable for outpatient HD   Consultants:   Vascular surgery Dr. Randie Heinz  Nephrology Dr. Briant Cedar  Procedures:  11/22 CT bilateral lower extremity:-No evidence of fracture or dislocation. -. Diffuse soft tissue edema involving right hip/entirety of right lower extremity, with large 5.4 cm fluid-filled blister along the anterolateral right lower leg. Multiple smaller blisters along the medial right thigh. Negative abscess - Small bilateral knee joint effusions, Rt>> Lt   Antimicrobials:  Zosyn 11/22  Vancomycin 11/22  Objective: Vitals:   09/24/16 1700 09/24/16 2030 09/25/16 0522 09/25/16 0912  BP: 110/68 (!) 131/92 107/72 104/76  Pulse: 78 (!) 57 89 (!) 53  Resp: 14 16 16 14   Temp: 98.6 F (37 C) 99.2 F (37.3 C) 98.3 F (36.8 C) 97.9 F (36.6 C)  TempSrc: Oral Oral Oral Oral  SpO2: 100% 96% 99% 97%  Weight:      Height:        Intake/Output Summary (Last 24 hours) at 09/25/16 1214 Last data filed at 09/25/16 0912  Gross per 24 hour  Intake  762 ml  Output                0 ml  Net              762 ml   Filed Weights   09/22/16 1811 09/22/16 2203 09/23/16 2136  Weight: 47.8 kg (105 lb 6.1 oz) 47.9 kg (105 lb 9.6 oz) 48.2 kg (106 lb 4.8 oz)    Examination:  General exam: AAOx3, chronically ill appearing Respiratory system: Clear to auscultation.  Cardiovascular system: S1 & S2 heard, RRR.  Extremities: Range of movement continues to improve. Right lower extremity with edema decreasing. Penrose on R thigh    Skin: Blister in legs/ extremities. Fistula in the left arm. R thigh with multiple wounds, some deep without active purulence Psychiatry: more cheerful today  Data Reviewed: I have personally reviewed following labs and imaging studies  CBC:  Recent Labs Lab 09/19/16 0420 09/20/16 0639 09/21/16 0616 09/22/16 1330  WBC 15.7* 13.9* 15.1* 14.7*  HGB 9.3* 8.8* 9.6* 8.8*  HCT 30.3* 28.8* 31.2* 28.3*  MCV 89.9 87.8 88.1 87.6  PLT 418* 424* 419* 399   Basic Metabolic Panel:  Recent Labs Lab 09/19/16 0420 09/20/16 0639 09/21/16 0616 09/22/16 1330 09/24/16 0530  NA 142 142 138 144 143  K 3.8 4.9 3.8 4.9 4.6  CL 101 102 97* 106 103  CO2 23 18* 24 21* 26  GLUCOSE 75 73 85 119* 80  BUN 85* 41* 20 47* 43*  CREATININE 8.87* 5.90* 3.68* 5.63* 4.87*  CALCIUM 8.0* 8.0* 7.3* 7.2* 7.2*  PHOS 3.3 2.5 1.9* 2.8 3.6   GFR: Estimated Creatinine Clearance: 13.4 mL/min (by C-G formula based on SCr of 4.87 mg/dL (H)). Liver Function Tests:  Recent Labs Lab 09/19/16 0420 09/20/16 0639 09/21/16 0616 09/22/16 1330 09/24/16 0530  ALBUMIN 2.2* 2.4* 2.2* 2.2* 2.4*   No results for input(s): LIPASE, AMYLASE in the last 168 hours. No results for input(s): AMMONIA in the last 168 hours. Coagulation Profile: No results for input(s): INR, PROTIME in the last 168 hours. Cardiac Enzymes: No results for input(s): CKTOTAL, CKMB, CKMBINDEX, TROPONINI in the last 168 hours. BNP (last 3 results) No results for input(s): PROBNP in the last 8760 hours. HbA1C: No results for input(s): HGBA1C in the last 72 hours. CBG: No results for input(s): GLUCAP in the last 168 hours. Lipid Profile: No results for input(s): CHOL, HDL,  LDLCALC, TRIG, CHOLHDL, LDLDIRECT in the last 72 hours. Thyroid Function Tests: No results for input(s): TSH, T4TOTAL, FREET4, T3FREE, THYROIDAB in the last 72 hours. Anemia Panel:  Recent Labs  09/22/16 1330  FERRITIN 816*  TIBC 134*  IRON 37   Sepsis Labs: No results for input(s): PROCALCITON, LATICACIDVEN in the last 168 hours.  Recent Results (from the past 240 hour(s))  Aerobic/Anaerobic Culture (surgical/deep wound)     Status: None   Collection Time: 09/15/16  5:34 PM  Result Value Ref Range Status   Specimen Description WOUND RIGHT THIGH  Final   Special Requests PATIENT ON FOLLOWING ZOSYN NO ANAEROBIC SWAB SENT  Final   Gram Stain NO WBC SEEN NO ORGANISMS SEEN   Final   Culture   Final    RARE STAPHYLOCOCCUS SPECIES (COAGULASE NEGATIVE) CALL MICROBIOLOGY LAB IF SENSITIVITIES ARE REQUIRED. NO ANAEROBES ISOLATED    Report Status 09/20/2016 FINAL  Final    Radiology Studies: No results found.  Scheduled Meds: . acetaminophen  500 mg Oral Q8H  . calcitonin (salmon)  1 spray  Alternating Nares Daily  . cinacalcet  90 mg Oral Q supper  . darbepoetin (ARANESP) injection - DIALYSIS  60 mcg Intravenous Q Fri-HD  . diphenhydrAMINE  25 mg Oral Q M,W,F-HD  . enoxaparin (LOVENOX) injection  30 mg Subcutaneous Q24H  . feeding supplement (NEPRO CARB STEADY)  237 mL Oral TID WC  . feeding supplement (PRO-STAT SUGAR FREE 64)  30 mL Oral BID  . fentaNYL  100 mcg Transdermal Q72H  . [START ON 09/27/2016] ferric gluconate (FERRLECIT/NULECIT) IV  125 mg Intravenous Q Wed-HD  . gabapentin  300 mg Oral BID  . lidocaine   Topical TID  . LORazepam  1 mg Oral QHS  . methocarbamol  500 mg Oral Q8H  . multivitamin  1 tablet Oral QHS  . potassium chloride  20 mEq Oral Daily  . senna  2 tablet Oral Daily  . sertraline  100 mg Oral Daily  . sodium thiosulfate infusion for calciphylaxis  25 g Intravenous Q M,W,F-HD   Continuous Infusions:    LOS: 12 days   Zannie Cove, MD [facility] Pager [phone]  If 7PM-7AM, please contact night-coverage [email or web address] Password [email or web address] 09/25/2016, 12:14 PM

## 2016-09-25 NOTE — Consult Note (Addendum)
WOC re-consulted for pain with calciphylaxis wounds, however we have suggested on 09/18/16 to use topical lidocaine.  We do not know of any other gels or topical treatment that will help with pain. The consult references "gel used for burn patients" which may be RadiaPlex wound gel with hyaluronic acid.  I am not sure that this gel would decrease the patient's pain.  If interested in trying, this can be ordered from the pharmacy, may have to be shipped from the [facility] pharmacy.    Thanks  Firmin Belisle [employer], RN,CWOCN, CNS [phone])

## 2016-09-25 NOTE — Progress Notes (Signed)
Daily Progress Note   Patient Name: [name] Small       Date: 09/25/2016 DOB: 09-06-1991  Age: 25 y.o. MRN#: [id number] Attending Physician: Zannie Cove, MD Primary Care Physician: Jeanann Lewandowsky, MD Admit Date: 09/13/2016  Reason for Consultation/Follow-up: Establishing goals of care and Pain control  Subjective: I spoke today with bedside RN and with [name] Small while she is in dialysis briefly. I discussed symptom management with [name] Small and encouraged her to utilize OxyIR instead of IV dilaudid as this will control her pain longer and also be something she can utilize at home. She tells me that she thought she was supposed to use the OxyIR as backup if dilaudid doesn't work. Education given. Also told RN to only give dilaudid if OxyIR ineffective after 1 hour. They also both confirm that pain is overall improved and that [name] Small is tolerating medications without any lethargy or confusion and fully functional.   Also provided emotional support to [name] Small.    Review of Systems  Constitutional: Positive for malaise/fatigue and weight loss. Negative for fever.  Respiratory: Positive for shortness of breath.   Cardiovascular: Negative.   Gastrointestinal: Negative.   Musculoskeletal: Positive for back pain, joint pain and myalgias.  Neurological: Positive for weakness.  Endo/Heme/Allergies: Negative.   Psychiatric/Behavioral: Positive for depression.  All other systems reviewed and are negative.   Length of Stay: 12  Current Medications: Scheduled Meds:  . acetaminophen  500 mg Oral Q8H  . calcitonin (salmon)  1 spray Alternating Nares Daily  . cinacalcet  90 mg Oral Q supper  . darbepoetin (ARANESP) injection - DIALYSIS  60 mcg Intravenous Q Fri-HD  . diphenhydrAMINE  25 mg Intravenous Q4H  while awake  . enoxaparin (LOVENOX) injection  30 mg Subcutaneous Q24H  . feeding supplement (NEPRO CARB STEADY)  237 mL Oral TID WC  . feeding supplement (PRO-STAT SUGAR FREE 64)  30 mL Oral BID  . fentaNYL  100 mcg Transdermal Q72H  . [START ON 09/27/2016] ferric gluconate (FERRLECIT/NULECIT) IV  125 mg Intravenous Q Wed-HD  . gabapentin  300 mg Oral BID  . lidocaine   Topical TID  . LORazepam  1 mg Oral QHS  . methocarbamol  500 mg Oral Q8H  . multivitamin  1 tablet Oral QHS  . potassium chloride  20 mEq Oral Daily  .  senna  2 tablet Oral Daily  . sertraline  100 mg Oral Daily  . sodium thiosulfate infusion for calciphylaxis  25 g Intravenous Q M,W,F-HD    Continuous Infusions:   PRN Meds: acetaminophen, diphenhydrAMINE **OR** [DISCONTINUED] diphenhydrAMINE (BENADRYL) IVPB(SICKLE CELL ONLY), famotidine, HYDROmorphone (DILAUDID) injection, naloxone **AND** sodium chloride flush, ondansetron (ZOFRAN) IV, oxyCODONE  Physical Exam  Constitutional: She appears well-developed.  Cardiovascular: Regular rhythm.   Tachycardic   Pulmonary/Chest: Effort normal and breath sounds normal.  Abdominal: Soft. Bowel sounds are normal.  Last BM 12/1  Skin:  RLE wrapped in ace bandage, foam dressing in place over L shin  Psychiatric:  tearful            Vital Signs: BP 113/86   Pulse 82   Temp 98.7 F (37.1 C) (Oral)   Resp 18   Ht 5\' 3"  (1.6 m)   Wt 52.5 kg (115 lb 11.9 oz)   SpO2 97%   BMI 20.50 kg/m  SpO2: SpO2: 97 % O2 Device: O2 Device: Not Delivered O2 Flow Rate: O2 Flow Rate (L/min): 0 L/min  Intake/output summary:   Intake/Output Summary (Last 24 hours) at 09/25/16 1522 Last data filed at 09/25/16 1610  Gross per 24 hour  Intake              540 ml  Output                0 ml  Net              540 ml   LBM: Last BM Date: 09/24/16 Baseline Weight: Weight: 51.3 kg (113 lb) Most recent weight: Weight: 52.5 kg (115 lb 11.9 oz)       Palliative Assessment/Data: PPS:  50%   Flowsheet Rows   Flowsheet Row Most Recent Value  Intake Tab  Referral Department  Hospitalist  Unit at Time of Referral  Med/Surg Unit  Palliative Care Primary Diagnosis  Nephrology  Date Notified  09/14/16  Palliative Care Type  New Palliative care  Reason for referral  Clarify Goals of Care, Pain, Psychosocial or Spiritual support  Date of Admission  09/13/16  Date first seen by Palliative Care  09/16/16  # of days Palliative referral response time  2 Day(s)  # of days IP prior to Palliative referral  1  Clinical Assessment  Palliative Performance Scale Score  40%  Pain Max last 24 hours  10  Pain Min Last 24 hours  4  Dyspnea Max Last 24 Hours  0  Dyspnea Min Last 24 hours  0  Nausea Max Last 24 Hours  0  Nausea Min Last 24 Hours  0  Anxiety Max Last 24 Hours  5  Anxiety Min Last 24 Hours  2  Psychosocial & Spiritual Assessment  Palliative Care Outcomes  Patient/Family meeting held?  Yes  Who was at the meeting?  pt  Palliative Care Outcomes  Improved pain interventions, Provided advance care planning  Palliative Care follow-up planned  Yes, Facility      Patient Active Problem List   Diagnosis Date Noted  . DNR (do not resuscitate)   . Pain of right lower extremity   . Palliative care by specialist   . Advance care planning   . Goals of care, counseling/discussion   . 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 Palliative care encounter   . Edema of right lower extremity 09/14/2016  . Leg edema, right 09/14/2016  . Cellulitis 09/13/2016  . Generalized anxiety disorder 09/05/2016  .  Post-operative pain   . Labile blood pressure   . Closed nondisplaced fracture of pelvis (HCC)   . Other osteoporosis with current pathological fracture   . Recurrent major depressive disorder, in partial remission (HCC)   . Acute blood loss anemia   . 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 Anemia of chronic disease   . H/O medication noncompliance   . Uremic  pericarditis 08/16/2016  . Acute on chronic systolic CHF (congestive heart failure) (HCC) 08/16/2016  . Noncompliance of patient with renal dialysis (HCC) 08/12/2016  . 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 Osteoporosis 08/01/2016  . Thoracic compression fracture - T7, T8, T12 07/27/2016  . Acute pulmonary edema (HCC) 07/26/2016  . Essential hypertension 06/17/2016  . Depression with anxiety 06/17/2016  . Volume overload 04/14/2016  . Peripheral edema 04/14/2016  . NICM (nonischemic cardiomyopathy) (HCC) 04/14/2016  . Internal jugular (IJ) vein thromboembolism, acute (HCC) 04/14/2016  . Pericardial effusion 04/14/2016  . Chronic anticoagulation   . Hypertensive urgency 04/11/2016  . DVT (deep venous thrombosis), left 04/11/2016  . Cellulitis of left upper extremity 03/31/2016  . Chronic pain syndrome 02/29/2016  . Chronic systolic heart failure (HCC) 01/04/2016  . AICD (automatic cardioverter/defibrillator) present 01/04/2016  . 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 Pathologic pelvic fracture 01/04/2016  . Depression 01/04/2016  . Pain in the chest   . ESRD on dialysis (HCC)   . Tachycardia   . Elevated serum hCG   . Chest pain 10/11/2015  . Fluid overload 10/10/2015  . Hypertension 10/10/2015  . Anemia of chronic renal failure, stage 5 (HCC) 10/04/2015  . Thrombocytopenia (HCC) 10/04/2015  . Chest pain of uncertain etiology 10/04/2015  . Chronically Elevated troponin 03/03/2015  . Hyperkalemia 02/21/2015  . Acute on chronic systolic heart failure (HCC) 02/21/2012  . Pulmonary infarct (HCC) 02/07/2012  . ESRD (end stage renal disease) on dialysis (HCC) 02/03/2012  . PE (pulmonary embolism) 02/03/2012    Palliative Care Assessment & Plan   Patient Profile: 25 y.o. female  with past medical history of End-stage renal disease secondary to polycystic disease, status post transplant in 2008, transplant failure in 2012, nephrectomy  in 2012, now on hemodialysis, sickle cell anemia, chronic systolic heart failure with AICD placement (EF September 2000 1712%), PE DVT, opioid usage since age 25, noncompliance with hemodialysis and pain medicine admitted on 09/13/2016 with increased edema, and right lower extremity swelling and pain. Patient has been in the hospital 13 times in 2017. Patient has now acute on chronic pain secondary to recent compression fractures at levels T7, T8, T12 as well as pelvis. Patient also went to the OR on 09/15/2016 for skin graft revision on the right. She is verbalizing back pain for the past 2 months and severe right leg pain 1 week. She states the pain is been so severe that she is only walking a few steps in her apartment. Her boyfriend comes over and does her dishes and cooking. She does not have a car and this is made it difficult for her to be compliant with some of her appointments as well as her worsening pain. She states her parents are very actively involved in her life and she describes a close relationship with them. She states her mother does all of her grocery shopping for her.   Assessment/Recommendations/Plan   Pain- based on patient 24 hr requirements of 8mg  prn hydromorphone- continue Fentanyl patch to 15400mcg/hr. Educated  on use of oxycodone 20mg  q2hr prn (based on 10% dosing equalanalgesic of long acting medication). Encouraged to take Tylenol as scheduled d/t research indicates acetaminophen increases effect of opioid analgesia when taken together. Contine IV dilaudid PRN for very severe pain not relieved by oxycodone. Premed with Oxycodone IR before attempting to ambulate.  Start senna 2 tabs daily for bowel prophylaxis. Monitor for constipation. Last BM charted is 12/4.  GOC- her current GOC is to stabilize enough to return home and continue dialysis. She also wants to be able to ambulate with her walker.  Goals of Care and Additional Recommendations:  Limitations on Scope of  Treatment: Full Scope Treatment and DNR status  Code Status:  DNR  Prognosis:   < 3 months based on ESRD with recent calciphylaxis diagnosis  Discharge Planning:  Home with Palliative Services  Care plan was discussed with patient and patient's RN.   Thank you for allowing the Palliative Medicine Team to assist in the care of this patient.   Time In: 1450 Time Out: 1520 Total Time 30 mins Prolonged Time Billed No      Greater than 50%  of this time was spent counseling and coordinating care related to the above assessment and plan.  Yong Channel, NP Palliative Medicine Team Pager # [phone] (M-F 8a-5p) Team Phone # [phone] (Nights/Weekends)   Please contact Palliative Medicine Team phone at [phone] for questions and concerns.

## 2016-09-26 DIAGNOSIS — Z95810 Presence of automatic (implantable) cardiac defibrillator: Secondary | ICD-10-CM

## 2016-09-26 DIAGNOSIS — L03115 Cellulitis of right lower limb: Secondary | ICD-10-CM

## 2016-09-26 DIAGNOSIS — R52 Pain, unspecified: Secondary | ICD-10-CM

## 2016-09-26 DIAGNOSIS — N186 End stage renal disease: Secondary | ICD-10-CM

## 2016-09-26 DIAGNOSIS — M79604 Pain in right leg: Secondary | ICD-10-CM

## 2016-09-26 DIAGNOSIS — Z9114 Patient's other noncompliance with medication regimen: Secondary | ICD-10-CM

## 2016-09-26 DIAGNOSIS — Z992 Dependence on renal dialysis: Secondary | ICD-10-CM

## 2016-09-26 DIAGNOSIS — D638 Anemia in other chronic diseases classified elsewhere: Secondary | ICD-10-CM

## 2016-09-26 DIAGNOSIS — W19XXXA Unspecified fall, initial encounter: Secondary | ICD-10-CM

## 2016-09-26 DIAGNOSIS — W19XXXS Unspecified fall, sequela: Secondary | ICD-10-CM

## 2016-09-26 DIAGNOSIS — D72829 Elevated white blood cell count, unspecified: Secondary | ICD-10-CM

## 2016-09-26 DIAGNOSIS — I428 Other cardiomyopathies: Secondary | ICD-10-CM

## 2016-09-26 DIAGNOSIS — I150 Renovascular hypertension: Secondary | ICD-10-CM

## 2016-09-26 DIAGNOSIS — F4323 Adjustment disorder with mixed anxiety and depressed mood: Secondary | ICD-10-CM

## 2016-09-26 DIAGNOSIS — Z720 Tobacco use: Secondary | ICD-10-CM

## 2016-09-26 DIAGNOSIS — Q782 Osteopetrosis: Secondary | ICD-10-CM

## 2016-09-26 MED ORDER — FENTANYL 50 MCG/HR TD PT72
100.0000 ug | MEDICATED_PATCH | TRANSDERMAL | Status: DC
  Administered 2016-09-26: 100 ug via TRANSDERMAL
  Filled 2016-09-26: qty 2

## 2016-09-26 MED ORDER — HYDROMORPHONE HCL 1 MG/ML IJ SOLN
0.5000 mg | INTRAMUSCULAR | Status: DC | PRN
  Administered 2016-09-26 – 2016-09-27 (×4): 1 mg via INTRAVENOUS
  Filled 2016-09-26 (×5): qty 1

## 2016-09-26 MED ORDER — OXYCODONE HCL 5 MG PO TABS
20.0000 mg | ORAL_TABLET | ORAL | Status: DC
  Administered 2016-09-26 (×4): 20 mg via ORAL
  Filled 2016-09-26 (×4): qty 4

## 2016-09-26 MED ORDER — HYDROMORPHONE HCL 1 MG/ML IJ SOLN: 1.0000 mg | INTRAMUSCULAR | Status: DC | PRN

## 2016-09-26 NOTE — Consult Note (Signed)
Physical Medicine and Rehabilitation Consult   Reason for Consult: Acute on chronic pan limiting activity.   Referring Physician: Dr. Jomarie Longs   HPI: [name] Small is a 25 y.o. female with history of ESRD, NICM, non-compliance, severe osteoporosis, anxiety/depression, chronic pain, fall  with recent thoracic compression fractures and prolonged hospital stay 10/4 to - 09/02/16. She was admitted to CIR nad reached modified independent level but activity was limited due to ongoing pain issues. She lives alone and reports was  spending most of the day in bed due to BLE pain--boyfriend and parents were assisting with home management. She was admitted on 09/13/16 with BLE edema with blistering and severe pain.   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 Darrick Penna. On IV antibiotics empirically for leucocytosis and question of cellulitis. 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  She continues to require IV dilaudid every 4 hours and palliative care following for pain management.  Therapy ongoing and mobility limited by severe BLE pain.   She has not been able to tolerate sitting up in a chair yet. She has lacks awareness of her condition and feels that she can return to home and manage her home/meals at discharge. Discussed SNF with longer length of stay to help provide lower level therapy and assistance after discharge. She reports that her parents are elderly and may be able to check in on her?   Review of Systems  HENT: Negative for hearing loss.   Eyes: Negative for photophobia.  Respiratory: Negative for cough and shortness of breath.   Cardiovascular: Negative for chest pain and palpitations.  Gastrointestinal: Negative for abdominal pain and heartburn.  Musculoskeletal: Positive for joint pain and myalgias.  Neurological: Positive for  sensory change, focal weakness and weakness.  Psychiatric/Behavioral: The patient is nervous/anxious.   All other systems reviewed and are negative.     Past Medical History:  Diagnosis Date  . 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 Anemia   . Anxiety   . Cardiomyopathy   . Cellulitis and abscess of face 03/22/2013  . CHF (congestive heart failure) (HCC)   . Chronic anticoagulation   . Chronic pain   . Depression   . DVT (deep venous thrombosis) (HCC) ~ 2014   BLE  . Dysrhythmia    at times per pt.  . 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  Marland Kitchen ESRD (end stage renal disease) on dialysis [facility])    "MWF; [facility] " (09/2016)  . Fracture, thoracic vertebra (HCC) 07/2016   "T7-T12"  . GERD (gastroesophageal reflux disease)   . H/O transfusion of packed red blood cells   . Heart murmur   . Hypertension   . Narcotic abuse, continuous   . Osteoporosis   . Pelvic fracture (HCC) 06/2015   "on the right"  . Pulmonary emboli (HCC) 01/2012   Bilateral, moderate clot burden, areas of pulmonary infarction and central necrosis  . Sickle cell anemia (HCC)     Past Surgical History:  Procedure Laterality Date  . AV FISTULA PLACEMENT Bilateral    "right side stopped working & never had it revised" (04/11/2016)  . AV FISTULA PLACEMENT Right 08/01/2016   Procedure: INSERTION OF ARTERIOVENOUS (AV) GORE-TEX GRAFT RIGHT ARM;  Surgeon: Chuck Hinthristopher S Dickson, MD;  [facility];  Service: Vascular;  Laterality: Right;  . AV FISTULA PLACEMENT Right 08/31/2016   Procedure: INSERTION OF ARTERIOVENOUS (AV) GORE-TEX GRAFT- RIGHT FEMORAL LOOP GRAFT;  Surgeon: Maeola HarmanBrandon Christopher Cain, MD;  [facility];  Service: Vascular;  Laterality: Right;  . AVGG REMOVAL Right 08/03/2016   Procedure: REMOVAL OF ARTERIOVENOUS GORETEX  GRAFT (AVGG);  Surgeon: Maeola HarmanBrandon Christopher Cain, MD;  [facility];  Service: Vascular;  Laterality: Right;  . CARDIAC CATHETERIZATION    . IMPLANTABLE CARDIOVERTER DEFIBRILLATOR IMPLANT Right 12/2014  . INCISION AND DRAINAGE ABSCESS Right 03/21/2013   Procedure: INCISION AND DRAINAGE RIGHT CHEEK ABSCESS REMOVAL OF FOREIGN BODY;  Surgeon: Serena ColonelJefry Rosen, MD;  [facility];  Service: ENT;  Laterality: Right;  . INSERTION OF DIALYSIS CATHETER Right Mar 09, 2016   [facility]  . INSERTION OF DIALYSIS CATHETER Left 09/02/2016   Procedure: Insertion of Dialysis Catheter Left Femoral Vein;  Surgeon: Maeola HarmanBrandon Christopher Cain, MD;  [facility];  Service: Vascular;  Laterality: Left;  . 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 NEPHRECTOMY Right     kidney placed in 2008, and body rejected in 2012   . PERIPHERAL VASCULAR CATHETERIZATION Left 03/09/2016   Procedure: A/V Shuntogram/Fistulagram;  Surgeon: Annice NeedyJason S Dew, MD;  Location: [facility] INVASIVE CV LAB;  Service: Cardiovascular;  Laterality: Left;  . PERIPHERAL VASCULAR CATHETERIZATION N/A 05/09/2016   Procedure: Dialysis/Perma Catheter Removal;  Surgeon: Renford DillsGregory G Schnier, MD;  Location: [facility] INVASIVE CV LAB;  Service: Cardiovascular;  Laterality: N/A;  . PERIPHERAL VASCULAR CATHETERIZATION N/A 07/12/2016   Procedure: Dialysis/Perma Catheter Insertion;  Surgeon: Annice NeedyJason S Dew, MD;  Location: [facility] INVASIVE CV LAB;  Service: Cardiovascular;  Laterality: N/A;  . PERIPHERAL VASCULAR CATHETERIZATION Right 07/31/2016   Procedure: Upper Extremity Venography;  Surgeon: Maeola HarmanBrandon Christopher Cain, MD;  [facility];  Service: Cardiovascular;  Laterality: Right;  . REMOVAL OF GRAFT Right 09/15/2016   Procedure: REMOVAL OF RIGHT THIGH GRAFT;  Surgeon: Sherren Kernsharles E Fields, MD;  [facility];  Service: Vascular;  Laterality: Right;  . REVISON OF ARTERIOVENOUS FISTULA Left 03/22/2016   Procedure: REVISON OF ARTERIOVENOUS FISTULA ( ARTEGRAFT );   Surgeon: Annice NeedyJason S Dew, MD;  Location: [facility] ORS;  Service: Vascular;  Laterality: Left;  . TONSILLECTOMY AND ADENOIDECTOMY  ~ 2000    Family History  Problem Relation Age of Onset  . Polycystic kidney disease Father   . Hypertension Father    Social History:  Lives alone. Independent prior to October. She  reports that she quit smoking about 9 months ago. Her smoking use included Cigarettes. She smoked 0.00 packs per day for 1.00 year. She has never used smokeless tobacco. She reports that she does not drink alcohol or use drugs.    Allergies  Allergen Reactions  . Aspirin Other (See Comments)    Interacts with Coreg  . Tramadol Anaphylaxis  . Vicodin [Hydrocodone-Acetaminophen] Hives  . Buprenorphine Hcl Itching    Ok with oxycodone  . Iohexol Itching  . Morphine And Related Itching and Other (See Comments)    Ok with oxycodone    Medications Prior to Admission  Medication Sig Dispense Refill  . calcitonin, salmon, (MIACALCIN) 200 UNIT/ACT nasal spray Place 1 spray into alternate nostrils daily. 3.7 mL 0  . calcitRIOL (ROCALTROL) 0.5 MCG capsule Take 2 capsules (1 mcg total) by mouth every Monday, Wednesday, and Friday with hemodialysis. 90 capsule 0  .  cinacalcet (SENSIPAR) 30 MG tablet Take 3 tablets (90 mg total) by mouth daily with supper. 180 tablet 0  . famotidine (PEPCID) 20 MG tablet Take 1 tablet (20 mg total) by mouth 2 (two) times daily as needed for heartburn or indigestion. 60 tablet 0  . gabapentin (NEURONTIN) 100 MG capsule Take 1 capsule (100 mg total) by mouth 2 (two) times daily. 60 capsule 2  . lanthanum (FOSRENOL) 1000 MG chewable tablet Chew 1 tablet (1,000 mg total) by mouth 3 (three) times daily with meals. 90 tablet 0  . lidocaine (LIDODERM) 5 % Place 1 patch onto the skin daily. Remove & Discard patch within 12 hours or as directed by MD 30 patch 0  . LORazepam (ATIVAN) 1 MG tablet Take 1 tablet (1 mg total) by mouth at bedtime. 30 tablet 1  . methocarbamol  (ROBAXIN) 500 MG tablet Take 1 tablet (500 mg total) by mouth every 6 (six) hours as needed for muscle spasms. 90 tablet 0  . multivitamin (RENA-VIT) TABS tablet Take 1 tablet by mouth daily. 90 tablet 3  . naproxen (NAPROSYN) 500 MG tablet Take 1 tablet (500 mg total) by mouth 2 (two) times daily with a meal. 60 tablet 0  . Oxycodone HCl 10 MG TABS Take 1 tablet (10 mg total) by mouth every 4 (four) hours as needed. 60 tablet 0  . sertraline (ZOLOFT) 100 MG tablet Take 1 tablet (100 mg total) by mouth daily. 30 tablet 3    Home: Home Living Family/patient expects to be discharged to:: Private residence Living Arrangements: Alone Available Help at Discharge: Available PRN/intermittently, Friend(s), Family Type of Home: Apartment Home Access: Level entry Home Layout: One level Bathroom Shower/Tub: Tub/shower unit, Engineer, building services: Standard Bathroom Accessibility: Yes Home Equipment: Environmental consultant - 2 wheels, Bedside commode Additional Comments: Pt sponge bathing since she has a femoreal dialysis catheter  Functional History: Prior Function Level of Independence: Independent with assistive device(s), Needs assistance Gait / Transfers Assistance Needed: Requires use of RW for ambulation. Reports her dad had to carry her up the stairs to get into their home. Comments: Pt uses [place] transport to op dialysis Functional Status:  Mobility: Bed Mobility Overal bed mobility: Needs Assistance Bed Mobility: Supine to Sit, Sit to Supine Supine to sit: Supervision, HOB elevated Sit to supine: Supervision, HOB elevated General bed mobility comments: pt required increased time and process was painful but she was able to get self from supine to EOB as well as back to supine from sitting Transfers Overall transfer level: Needs assistance Equipment used: Rolling walker (2 wheeled) Transfers: Sit to/from Stand Sit to Stand: Min assist Stand pivot transfers: Min assist, +2  safety/equipment General transfer comment: min A for power up and to steady Ambulation/Gait Ambulation/Gait assistance: Min assist Ambulation Distance (Feet): 3 Feet Assistive device: Rolling walker (2 wheeled) Gait Pattern/deviations: Decreased stride length General Gait Details: pt crying during ambulation due to pain, ambulated 3' fwd with RW and could go no further, 3' bkwd to bed.  Gait velocity: very slow Gait velocity interpretation: <1.8 ft/sec, indicative of risk for recurrent falls    ADL:    Cognition: Cognition Overall Cognitive Status: Within Functional Limits for tasks assessed Orientation Level: Oriented X4 Cognition Arousal/Alertness: Awake/alert Behavior During Therapy: WFL for tasks assessed/performed Overall Cognitive Status: Within Functional Limits for tasks assessed  Blood pressure 109/74, pulse (!) 50, temperature 98.4 F (36.9 C), temperature source Oral, resp. rate 17, height 5\' 3"  (1.6 m), weight 52.3 kg (115  lb 4.8 oz), SpO2 97 %. Physical Exam  Nursing note and vitals reviewed. Constitutional: She is oriented to person, place, and time. She appears well-developed and well-nourished.  Kyphotic posture Cachectic  HENT:  Head: Normocephalic and atraumatic.  Mouth/Throat: Oropharynx is clear and moist.  Eyes: Conjunctivae and EOM are normal. Pupils are equal, round, and reactive to light.  Neck: Normal range of motion. Neck supple.  Cardiovascular: Normal rate and regular rhythm.   Respiratory: Effort normal and breath sounds normal.  GI: Soft. Bowel sounds are normal. She exhibits no distension.  Musculoskeletal: She exhibits tenderness. She exhibits no edema.  Severe dysesthesias BLE to light touch--unable to tolerate ROM RLE.   Neurological: She is alert and oriented to person, place, and time.  Speech soft and clear.  Able to follow basic commands without difficulty.  Muscle wasting BUE/BLE.  She is able to move BUE without difficultly BLE  limited by pain, >/4/5.    Skin: Skin is warm and dry.  Dry dressing right thigh.   Psychiatric: Her speech is normal. Her mood appears anxious. Cognition and memory are normal. She expresses inappropriate judgment.    Results for orders placed or performed during the hospital encounter of 09/13/16 (from the past 24 hour(s))  CBC     Status: Abnormal   Collection Time: 09/25/16  1:49 PM  Result Value Ref Range   WBC 12.9 (H) 4.0 - 10.5 K/uL   RBC 3.21 (L) 3.87 - 5.11 MIL/uL   Hemoglobin 8.7 (L) 12.0 - 15.0 g/dL   HCT 04.5 (L) 40.9 - 81.1 %   MCV 91.3 78.0 - 100.0 fL   MCH 27.1 26.0 - 34.0 pg   MCHC 29.7 (L) 30.0 - 36.0 g/dL   RDW 91.4 (H) 78.2 - 95.6 %   Platelets 405 (H) 150 - 400 K/uL  Renal function panel     Status: Abnormal   Collection Time: 09/25/16  1:49 PM  Result Value Ref Range   Sodium 141 135 - 145 mmol/L   Potassium 5.6 (H) 3.5 - 5.1 mmol/L   Chloride 105 101 - 111 mmol/L   CO2 22 22 - 32 mmol/L   Glucose, Bld 93 65 - 99 mg/dL   BUN 71 (H) 6 - 20 mg/dL   Creatinine, Ser 2.13 (H) 0.44 - 1.00 mg/dL   Calcium 7.1 (L) 8.9 - 10.3 mg/dL   Phosphorus 3.7 2.5 - 4.6 mg/dL   Albumin 2.7 (L) 3.5 - 5.0 g/dL   GFR calc non Af Amer 8 (L) >60 mL/min   GFR calc Af Amer 9 (L) >60 mL/min   Anion gap 14 5 - 15   No results found.  Assessment/Plan: Diagnosis: Debility Labs and images independently reviewed.  Records reviewed and summated above.  1. Does the need for close, 24 hr/day medical supervision in concert with the patient's rehab needs make it unreasonable for this patient to be served in a less intensive setting? Potentially  2. Co-Morbidities requiring supervision/potential complications: ESRD (recs per Nephro, HD), NICM, non-compliance, severe osteoporosis with pathologic fractures, anxiety/depression (ensure anxiety and resulting apprehension do not limit functional progress; consider prn medications if warranted, ensure mood does not hinder progress of therapies),  chronic pain not controlled on fentanyl and IV dilaudid (Biofeedback training with therapies to help reduce reliance on opiate pain medications, monitor pain control during therapies, and sedation at rest and titrate to maximum efficacy to ensure participation and gains in therapies), fall with recent thoracic compression fractures s/p CIR,  tobacco abuse (counsel), leukocytosis (cont to monitor for signs and symptoms of infection, further workup if indicated), anemia of chronic disease (transfuse if necessary to ensure appropriate perfusion for increased activity tolerance), HTN (monitor and provide prns in accordance with increased physical exertion and pain) 3. Due to safety, skin/wound care, pain management and patient education, does the patient require 24 hr/day rehab nursing? Yes 4. Does the patient require coordinated care of a physician, rehab nurse, PT (1-2 hrs/day, 5 days/week) and OT (1-2 hrs/day, 5 days/week) to address physical and functional deficits in the context of the above medical diagnosis(es)? Yes Addressing deficits in the following areas: balance, endurance, locomotion, strength, transferring, bathing, dressing, toileting and psychosocial support 5. Can the patient actively participate in an intensive therapy program of at least 3 hrs of therapy per day at least 5 days per week? Not at present 6. The potential for patient to make measurable gains while on inpatient rehab is good and fair 7. Anticipated functional outcomes upon discharge from inpatient rehab are modified independent and supervision  with PT, modified independent and supervision with OT, n/a with SLP. 8. Estimated rehab length of stay to reach the above functional goals is: NA 9. Does the patient have adequate social supports and living environment to accommodate these discharge functional goals? Potentially 10. Anticipated D/C setting: Home 11. Anticipated post D/C treatments: HH therapy and Home excercise  program 12. Overall Rehab/Functional Prognosis: good and fair  RECOMMENDATIONS: This patient's condition is appropriate for continued rehabilitative care in the following setting: Pain appears to be limitting factor at this point.  Once pain under control, believe pt will be close to her baseline level of functioning.  It appears pt does not have adequate support at discharge and as such, would recommend SNF. Patient has agreed to participate in recommended program. Potentially Note that insurance prior authorization may be required for reimbursement for recommended care.  Comment: Rehab Admissions Coordinator to follow up.  Maryla Morrow, MD, [place] [facility] 09/26/2016

## 2016-09-26 NOTE — Progress Notes (Signed)
PT was unable to work with patient today. Patient is asking about her plan of care regarding discharge. MD notified. No further orders given.

## 2016-09-26 NOTE — Care Management Important Message (Signed)
Important Message  Patient Details  Name: Destri Rosenwinkel MRN: [id number] Date of Birth: September 04, 1991   Medicare Important Message Given:  Yes    Nishita Isaacks 09/26/2016, 10:56 AM

## 2016-09-26 NOTE — Care Management Note (Signed)
Case Management Note  Patient Details  Name: Neil Benevides MRN: [id number] Date of Birth: October 25, 1990  Subjective/Objective:    LE edema and blisters, AV graft infection                Action/Plan: Discharge Planning: Please see previous NCM note. Pt's HH arranged with [facility]. Pt with increased weakness and lives alone. CIR denied. Scheduled dc home with HH.    Expected Discharge Date:                Expected Discharge Plan:  Home w Home Health Services  In-House Referral:  Clinical Social Work  Discharge planning Services  CM Consult  Post Acute Care Choice:  Home Health, Hospice (Home with [facility] [place]) Choice offered to:  Patient  DME Arranged:  N/A DME Agency:     HH Arranged:  RN, PT, Nurse's Aide, OT, Disease Management HH Agency:  [facility])  Status of Service:  Completed, signed off  If discussed at Long Length of Stay Meetings, dates discussed:  09/26/2016  Additional Comments:  Elliot Cousin, RN 09/26/2016, 3:00 PM

## 2016-09-26 NOTE — Progress Notes (Signed)
[facility] Progress Note   Subjective: "I'm still hurting" Legs exquisitely tender to touch. Getting dilaudid IV q 4 hours for pain.  Denies SOB. Siting up in bed, back brace off. Dispo is issue- she wants to go home but will have many needs and will likely bounce back- refusing CIR at this time  Objective Vitals:   09/25/16 1830 09/25/16 2117 09/26/16 0527 09/26/16 1026  BP: 117/69 114/73 109/74 100/69  Pulse: (!) 111 (!) 115 (!) 50 (!) 110  Resp: 19 18 17 14   Temp: 99.2 F (37.3 C) 98.5 F (36.9 C) 98.4 F (36.9 C) 99.4 F (37.4 C)  TempSrc: Oral Oral Oral Oral  SpO2: 98% 99% 97% 97%  Weight:  52.3 kg (115 lb 4.8 oz)    Height:       Physical Exam General: Pleasant, chronically ill appearing female in NAD, facial edema present.  Heart: S1,S2, 3/6 systolic M. No R/G Lungs: BBS CTAB Abdomen: Abd soft, non-tender Extremities: trace BLE. Multiple wounds covered with allevyn dressings. Ace wrap RU thigh Dialysis Access: L fem TDC. Drsg intact.   Dialysis Orders: Dialysis Orders: MWF 4h Opiflux 180 BFR 400/800 2K/2Ca EDW 49.5kg left fem TDC Heparin 2000 U IV Bolus q HD  Hectorol 1mcg IV q HD  Aranesp 60 mcg IV q week (not given yet)   Additional Objective Labs: Basic Metabolic Panel:  Recent Labs Lab 09/22/16 1330 09/24/16 0530 09/25/16 1349  NA 144 143 141  K 4.9 4.6 5.6*  CL 106 103 105  CO2 21* 26 22  GLUCOSE 119* 80 93  BUN 47* 43* 71*  CREATININE 5.63* 4.87* 6.72*  CALCIUM 7.2* 7.2* 7.1*  PHOS 2.8 3.6 3.7   Liver Function Tests:  Recent Labs Lab 09/22/16 1330 09/24/16 0530 09/25/16 1349  ALBUMIN 2.2* 2.4* 2.7*   No results for input(s): LIPASE, AMYLASE in the last 168 hours. CBC:  Recent Labs Lab 09/20/16 0639 09/21/16 0616 09/22/16 1330 09/25/16 1349  WBC 13.9* 15.1* 14.7* 12.9*  HGB 8.8* 9.6* 8.8* 8.7*  HCT 28.8* 31.2* 28.3* 29.3*  MCV 87.8 88.1 87.6 91.3  PLT 424* 419* 399 405*   Blood Culture    Component  Value Date/Time   SDES WOUND RIGHT THIGH 09/15/2016 1734   SPECREQUEST PATIENT ON FOLLOWING ZOSYN NO ANAEROBIC SWAB SENT 09/15/2016 1734   CULT  09/15/2016 1734    RARE STAPHYLOCOCCUS SPECIES (COAGULASE NEGATIVE) CALL MICROBIOLOGY LAB IF SENSITIVITIES ARE REQUIRED. NO ANAEROBES ISOLATED    REPTSTATUS 09/20/2016 FINAL 09/15/2016 1734    Cardiac Enzymes: No results for input(s): CKTOTAL, CKMB, CKMBINDEX, TROPONINI in the last 168 hours. CBG: No results for input(s): GLUCAP in the last 168 hours. Iron Studies: No results for input(s): IRON, TIBC, TRANSFERRIN, FERRITIN in the last 72 hours. @lablastinr3 @ Studies/Results: No results found. Medications:  . acetaminophen  500 mg Oral Q8H  . calcitonin (salmon)  1 spray Alternating Nares Daily  . cinacalcet  90 mg Oral Q supper  . darbepoetin (ARANESP) injection - DIALYSIS  60 mcg Intravenous Q Fri-HD  . diphenhydrAMINE  25 mg Intravenous Q4H while awake  . enoxaparin (LOVENOX) injection  30 mg Subcutaneous Q24H  . feeding supplement (NEPRO CARB STEADY)  237 mL Oral TID WC  . feeding supplement (PRO-STAT SUGAR FREE 64)  30 mL Oral BID  . fentaNYL  100 mcg Transdermal Q72H  . [START ON 09/27/2016] ferric gluconate (FERRLECIT/NULECIT) IV  125 mg Intravenous Q Wed-HD  . gabapentin  300 mg Oral BID  .  lidocaine   Topical TID  . LORazepam  1 mg Oral QHS  . methocarbamol  500 mg Oral Q8H  . multivitamin  1 tablet Oral QHS  . oxyCODONE  20 mg Oral Q4H  . potassium chloride  20 mEq Oral Daily  . senna  2 tablet Oral Daily  . sertraline  100 mg Oral Daily  . sodium thiosulfate infusion for calciphylaxis  25 g Intravenous Q M,W,F-HD     Assessment/Plan: 1. 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 2. ESRD-MWF HP. For HD tomorrow via PC.  3. Anemia- hgb 8.7.. ESA due Friday. Increase dose to 150 mcg IV. Continue weekly venofer.    4. 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 Started Na thio TIW with HD, hold all vit D/ Ca products. Cont sensipar and non Ca binders. Use low Ca bath. Last Ca 7.2 C Ca 8.5 09/24/13.  Pain control is major issue 5.Sec HPTH- PTH 2 ,569  08/30/16 - Phos 3.6. Binders on hold. Stopped VDRA; on sensipar 90 mg q day. On calcitonin nasal spray daily. No binder right now because phos is good  6. Hypotension/volume- still has peripheral and facial edema. HD yesterday pre wt 52.5 kg Net UF 1672 Post wt 50.8 kg. Closer to OP EDW. BP soft but pain meds may be contributing to lower BP. Attempt UF 1-2 liters tomorrow. Adjust EDW when DC'd.  7. Nutrition- alb 2.4, liberalized to regular diet. Start Prostat.   8. Symptom management- Palliative care has seen with recommendations for care outlined. Appreciate their involvement. Discussed transitioning to PO meds/ STILL Needs IV Pain meds with Dressing changes To legs And now DNR 9. NICM s/p AICD - EF 20-25% 06/19/2016 10. Hypokalemia: K+ 4.6 has been getting KDur 20 meq daily. On regular diet. Watch K+. 10. Dispo- I think her prognosis is poor enough palliative could be involved. Palliative care following, recommends return to CIR for rehab. Is being seen by [facility] for rehab-discussed SNF placement which might be a viable option for pt as she would have someone to assist her and transportation to HD. Patient says boyfriend/parents help her but have never seen them visit.   Rita H. Brown NP-C 09/26/2016, 11:40 AM  [facility] [phone]  Patient seen and examined, agree with above note with above modifications. Overall clinically improved but situation is tenuous- trying to get her as much assist as possible but I am afraid she will still bounce back to hospital before end of year.  HD tomorrow- she is symptomatic toward end of HD so need to  be gentle with UF Annie Sable, MD 09/26/2016

## 2016-09-26 NOTE — Progress Notes (Signed)
Daily Progress Note   Patient Name: [name] Small       Date: 09/26/2016 DOB: 02/08/1991  Age: 25 y.o. MRN#: [id number] Attending Physician: Zannie Cove, MD Primary Care Physician: Jeanann Lewandowsky, MD Admit Date: 09/13/2016  Reason for Consultation/Follow-up: Pain control  [name] is reasonably comfortable.  PT is coming today.  If she has severe pain with PT, I recommended strongly to her that she return to CIR for rehab.  She is worried about returning from HD exhausted and having to get herself from the Transport van up to her apartment and in the door by herself. - wondering if she will be able to do it.  She has continued to take IV dilaudid q 4 hours.  I have counseled her that this has to change.  Fentanyl patch continued, Oxy 20 is scheduled q 4.   Low dose dilaudid is left only for emergency break thru.  Subjective: Length of Stay: 13  Current Medications: Scheduled Meds:  . acetaminophen  500 mg Oral Q8H  . calcitonin (salmon)  1 spray Alternating Nares Daily  . cinacalcet  90 mg Oral Q supper  . darbepoetin (ARANESP) injection - DIALYSIS  60 mcg Intravenous Q Fri-HD  . diphenhydrAMINE  25 mg Intravenous Q4H while awake  . enoxaparin (LOVENOX) injection  30 mg Subcutaneous Q24H  . feeding supplement (NEPRO CARB STEADY)  237 mL Oral TID WC  . feeding supplement (PRO-STAT SUGAR FREE 64)  30 mL Oral BID  . fentaNYL  100 mcg Transdermal Q72H  . [START ON 09/27/2016] ferric gluconate (FERRLECIT/NULECIT) IV  125 mg Intravenous Q Wed-HD  . gabapentin  300 mg Oral BID  . lidocaine   Topical TID  . LORazepam  1 mg Oral QHS  . methocarbamol  500 mg Oral Q8H  . multivitamin  1 tablet Oral QHS  . oxyCODONE  20 mg Oral Q4H  . potassium chloride  20 mEq Oral Daily  . senna  2  tablet Oral Daily  . sertraline  100 mg Oral Daily  . sodium thiosulfate infusion for calciphylaxis  25 g Intravenous Q M,W,F-HD    Continuous Infusions:   PRN Meds: acetaminophen, diphenhydrAMINE **OR** [DISCONTINUED] diphenhydrAMINE (BENADRYL) IVPB(SICKLE CELL ONLY), famotidine, HYDROmorphone (DILAUDID) injection, naloxone **AND** sodium chloride flush, ondansetron (ZOFRAN) IV  Physical Exam  Constitutional: She is  oriented to person, place, and time.  Young petite female, lying in bed.  pleasant  HENT:  Head: Normocephalic and atraumatic.  Eyes: EOM are normal. No scleral icterus.  Neck: Neck supple. No thyromegaly present.  Cardiovascular: Regular rhythm.  Exam reveals no gallop and no friction rub.   No murmur heard. Pulmonary/Chest: Effort normal. No respiratory distress.  Abdominal: Soft. She exhibits no distension.  Musculoskeletal: She exhibits no edema.  Neurological: She is alert and oriented to person, place, and time.  Skin: Skin is warm and dry.  Psychiatric: She has a normal mood and affect. Judgment normal.           Vital Signs: BP 109/74 (BP Location: Right Arm)   Pulse (!) 50   Temp 98.4 F (36.9 C) (Oral)   Resp 17   Ht 5\' 3"  (1.6 m)   Wt 52.3 kg (115 lb 4.8 oz)   SpO2 97%   BMI 20.42 kg/m  SpO2: SpO2: 97 % O2 Device: O2 Device: Not Delivered O2 Flow Rate: O2 Flow Rate (L/min): 0 L/min  Intake/output summary:   Intake/Output Summary (Last 24 hours) at 09/26/16 0853 Last data filed at 09/26/16 0825  Gross per 24 hour  Intake              840 ml  Output             1673 ml  Net             -833 ml   LBM: Last BM Date: 09/25/16 Baseline Weight: Weight: 51.3 kg (113 lb) Most recent weight: Weight: 52.3 kg (115 lb 4.8 oz)       Palliative Assessment/Data:    Flowsheet Rows   Flowsheet Row Most Recent Value  Intake Tab  Referral Department  Hospitalist  Unit at Time of Referral  Med/Surg Unit  Palliative Care Primary Diagnosis  Nephrology   Date Notified  09/14/16  Palliative Care Type  New Palliative care  Reason for referral  Clarify Goals of Care, Pain, Psychosocial or Spiritual support  Date of Admission  09/13/16  Date first seen by Palliative Care  09/16/16  # of days Palliative referral response time  2 Day(s)  # of days IP prior to Palliative referral  1  Clinical Assessment  Palliative Performance Scale Score  40%  Pain Max last 24 hours  10  Pain Min Last 24 hours  4  Dyspnea Max Last 24 Hours  0  Dyspnea Min Last 24 hours  0  Nausea Max Last 24 Hours  0  Nausea Min Last 24 Hours  0  Anxiety Max Last 24 Hours  5  Anxiety Min Last 24 Hours  2  Psychosocial & Spiritual Assessment  Palliative Care Outcomes  Patient/Family meeting held?  Yes  Who was at the meeting?  pt  Palliative Care Outcomes  Improved pain interventions, Provided advance care planning  Palliative Care follow-up planned  Yes, Facility      Patient Active Problem List   Diagnosis Date Noted  . DNR (do not resuscitate)   . Pain of right lower extremity   . Palliative care by specialist   . Advance care planning   . Goals of care, counseling/discussion   . 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 Palliative care encounter   . Edema of right lower extremity 09/14/2016  . Leg edema, right 09/14/2016  . Cellulitis 09/13/2016  . Generalized anxiety disorder 09/05/2016  . Post-operative pain   . Labile blood pressure   .  Closed nondisplaced fracture of pelvis (HCC)   . Other osteoporosis with current pathological fracture   . Recurrent major depressive disorder, in partial remission (HCC)   . Acute blood loss anemia   . 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 Anemia of chronic disease   . H/O medication noncompliance   . 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 Acute on chronic systolic CHF (congestive heart failure) (HCC) 08/16/2016  . Noncompliance of patient with renal dialysis (HCC) 08/12/2016  . Hyperparathyroidism due to end  stage renal disease on dialysis (HCC) 08/01/2016  . Osteoporosis 08/01/2016  . Thoracic compression fracture - T7, T8, T12 07/27/2016  . Acute pulmonary edema (HCC) 07/26/2016  . Essential hypertension 06/17/2016  . Depression with anxiety 06/17/2016  . Volume overload 04/14/2016  . Peripheral edema 04/14/2016  . NICM (nonischemic cardiomyopathy) (HCC) 04/14/2016  . Internal jugular (IJ) vein thromboembolism, acute (HCC) 04/14/2016  . Pericardial effusion 04/14/2016  . Chronic anticoagulation   . Hypertensive urgency 04/11/2016  . DVT (deep venous thrombosis), left 04/11/2016  . Cellulitis of left upper extremity 03/31/2016  . Chronic pain syndrome 02/29/2016  . Chronic systolic heart failure (HCC) 01/04/2016  . AICD (automatic cardioverter/defibrillator) present 01/04/2016  . 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 Pathologic pelvic fracture 01/04/2016  . Depression 01/04/2016  . Pain in the chest   . ESRD on dialysis (HCC)   . Tachycardia   . Elevated serum hCG   . Chest pain 10/11/2015  . Fluid overload 10/10/2015  . Hypertension 10/10/2015  . Anemia of chronic renal failure, stage 5 (HCC) 10/04/2015  . Thrombocytopenia (HCC) 10/04/2015  . Chest pain of uncertain etiology 10/04/2015  . Chronically Elevated troponin 03/03/2015  . Hyperkalemia 02/21/2015  . Acute on chronic systolic heart failure (HCC) 02/21/2012  . Pulmonary infarct (HCC) 02/07/2012  . ESRD (end stage renal disease) on dialysis (HCC) 02/03/2012  . PE (pulmonary embolism) 02/03/2012    Palliative Care Assessment & Plan   Patient Profile: 25 y.o. female  with past medical history of end-stage renal disease secondary to polycystic disease, status post transplant in 2008, transplant failure in 2012, nephrectomy in 2012, now on hemodialysis, sickle cell anemia, chronic systolic heart failure with AICD placement (EF September 2017 12%), PE DVT, opioid usage since age 25, noncompliance with hemodialysis and pain  medicine admitted on 09/13/2016 with increased edema, and right lower extremity swelling and pain. Patient has been in the hospital 13 times in 2017. Patient has now acute on chronic pain secondary to recent compression fractures at levels T7, T8, T12 as well as pelvis. Patient also went to the OR on 09/15/2016 for skin graft revision on the right. She is verbalizing back pain for the past 2 months and severe right leg pain 1 week. She states the pain is been so severe that she is only walking a few steps in her apartment. Her boyfriend comes over and does her dishes and cooking. She does not have a car and this is made it difficult for her to be compliant with some of her appointments as well as her worsening pain. She states her parents are very actively involved in her life and she describes a close relationship with them.   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  Improved now with fentanyl patch.  Need to convert from IV dilaudid to oral pain meds.  Recommendations/Plan:  Continue fentanyl 100 mcq.  Add oxy 20 scheduled.  Taper off IV dilaudid.  DNR  CIR Recommended.  Goals of Care and Additional Recommendations:  Limitations on Scope of Treatment: Full Scope Treatment with DNR/DNI  Code Status:  DNR  Prognosis:   < 12 months - if she continues HD.  Discharge Planning:  To Be Determined  Planning for home with home health and palliative.  Care plan was discussed with Dr. Hillery Aldo - bedside RN, Family.  Thank you for allowing the Palliative Medicine Team to assist in the care of this patient.   Time In: 8:40 Time Out: 9:00 Total Time 20 min Prolonged Time Billed no      Greater than 50%  of this time was spent counseling and coordinating care related to the above assessment and plan.  Algis Downs, PA-C Palliative Medicine   Please contact Palliative MedicineTeam phone at [phone] for questions and concerns.   Please see AMION for individual  provider pager numbers.

## 2016-09-26 NOTE — Progress Notes (Signed)
Pt previously at CIR for 15 days and discharged on 09/02/2016 at supervision level with Home health and the assistance of her parents. Dr. Maryla Morrow consulted today and recommends home with [facility], when her pain is managed which is limiting her mobility with the 24/7 supervision of her family or SNF if that assistance is not available. I have alerted RN CM, Aleshia. [phone]

## 2016-09-26 NOTE — Progress Notes (Signed)
PROGRESS NOTE [facility]   ManVictoria Mione   ZOX:[id number] DOB: [birth year]  DOA: 09/13/2016 PCP: Jeanann LewandowskyJEGEDE, OLUGBEMIGA, MD   Brief Narrative:  [name] ChimeraVictoria Carteris a 25 y.o. 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 Recently hospitalized from 10/22-11/10/17 with T7,T8,T12 vertebral compression fractures and volume overload related to noncompliance with HD. She had a new right femoral AVG placed during admission on 08/31/16. Graft has not been used for HD. Presented to the ER because of increasing swelling of the right lower extremity with ulcerations and blisters. CT scan was done which was showing diffuse edema. Patient has had recent AV graft placed on right lower extremity and gets her dialysis done on the left lower extremity dialysis catheter. On exam patient has eschar over the recent AV graft placement on the right lower extremity with blisters in the lower extremity both left and right.  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 - Palliative care consult appreciated, PCA -stopped  and started Fentanyl patch, Oral and IV narcotics, now DNR, plan to continue HD - ambulate with PT, encouraged ambulation, CIR consulted, now agreeable -Home once able to ambulate  more and tolerate outpatient HD if CIR declines -continue wound care  Hypotension - borderline low   -stable, monitor  ESRD on hemodialysis TTS - Electrolyte abnormalities  -Renal recommendations appreciated -HD per Renal  Nonischemic cardiomyopathy S/P AICD placement  -EF 06/2016 =12% -volume managed with HD  T7,T8,T12 vertebral compression fractures -supportive care, PCA pump per palliative care on 11/28  Chronic anemia - hemoglobin stable at baseline -stable  DVT prophylaxis: Pt refusing all heparin injections: switched to lovenox 11/27 and still refusing, counseled patient about risks including death Code Status: Full Family Communication: none at bedside Disposition Plan: home when able to ambulate, pain controlled, and stable for outpatient HD   Consultants:   Vascular surgery Dr. Randie Heinzain  Nephrology Dr. Briant CedarMattingly  Procedures:  11/22 CT bilateral lower extremity:-No evidence of fracture or dislocation. -. Diffuse soft tissue edema involving right hip/entirety of right lower extremity, with large 5.4 cm fluid-filled blister along the anterolateral right lower leg. Multiple smaller blisters along the medial right thigh. Negative abscess - Small bilateral knee joint effusions, Rt>> Lt   Antimicrobials:  Zosyn 11/22  Vancomycin 11/22  Objective: Vitals:   09/25/16 1830 09/25/16 2117 09/26/16 0527 09/26/16 1026  BP: 117/69 114/73 109/74 100/69  Pulse: (!) 111 (!) 115 (!) 50 (!) 110  Resp: 19 18 17 14   Temp: 99.2 F (37.3 C) 98.5 F (36.9 C) 98.4 F (36.9 C) 99.4 F (37.4 C)  TempSrc: Oral Oral Oral Oral  SpO2: 98% 99% 97% 97%  Weight:  52.3 kg (115 lb 4.8 oz)    Height:        Intake/Output Summary (Last 24 hours) at 09/26/16 1423 Last data filed at 09/26/16 1337  Gross per 24  hour  Intake             1080 ml  Output             1673 ml  Net             -593 ml   Filed Weights   09/25/16 1335 09/25/16 1744 09/25/16 2117  Weight: 52.5 kg (115 lb 11.9  oz) 50.5 kg (111 lb 5.3 oz) 52.3 kg (115 lb 4.8 oz)   Examination:  General exam: AAOx3, chronically ill appearing Respiratory system: Clear to auscultation.  Cardiovascular system: S1 & S2 heard, RRR.  Extremities: Range of movement continues to improve. Right lower extremity with edema decreasing. Penrose on R thigh    Skin: Blister in legs/ extremities. Fistula in the left arm. R thigh with multiple wounds, some deep without active purulence Psychiatry: more cheerful today  Data Reviewed: I have personally reviewed following labs and imaging studies  CBC:  Recent Labs Lab 09/20/16 0639 09/21/16 0616 09/22/16 1330 09/25/16 1349  WBC 13.9* 15.1* 14.7* 12.9*  HGB 8.8* 9.6* 8.8* 8.7*  HCT 28.8* 31.2* 28.3* 29.3*  MCV 87.8 88.1 87.6 91.3  PLT 424* 419* 399 405*   Basic Metabolic Panel:  Recent Labs Lab 09/20/16 0639 09/21/16 0616 09/22/16 1330 09/24/16 0530 09/25/16 1349  NA 142 138 144 143 141  K 4.9 3.8 4.9 4.6 5.6*  CL 102 97* 106 103 105  CO2 18* 24 21* 26 22  GLUCOSE 73 85 119* 80 93  BUN 41* 20 47* 43* 71*  CREATININE 5.90* 3.68* 5.63* 4.87* 6.72*  CALCIUM 8.0* 7.3* 7.2* 7.2* 7.1*  PHOS 2.5 1.9* 2.8 3.6 3.7   GFR: Estimated Creatinine Clearance: 10.6 mL/min (by C-G formula based on SCr of 6.72 mg/dL (H)). Liver Function Tests:  Recent Labs Lab 09/20/16 3606 09/21/16 0616 09/22/16 1330 09/24/16 0530 09/25/16 1349  ALBUMIN 2.4* 2.2* 2.2* 2.4* 2.7*   No results for input(s): LIPASE, AMYLASE in the last 168 hours. No results for input(s): AMMONIA in the last 168 hours. Coagulation Profile: No results for input(s): INR, PROTIME in the last 168 hours. Cardiac Enzymes: No results for input(s): CKTOTAL, CKMB, CKMBINDEX, TROPONINI in the last 168 hours. BNP (last 3 results) No results for input(s): PROBNP in the last 8760 hours. HbA1C: No results for input(s): HGBA1C in the last 72 hours. CBG: No results for input(s): GLUCAP in the last 168  hours. Lipid Profile: No results for input(s): CHOL, HDL, LDLCALC, TRIG, CHOLHDL, LDLDIRECT in the last 72 hours. Thyroid Function Tests: No results for input(s): TSH, T4TOTAL, FREET4, T3FREE, THYROIDAB in the last 72 hours. Anemia Panel: No results for input(s): VITAMINB12, FOLATE, FERRITIN, TIBC, IRON, RETICCTPCT in the last 72 hours. Sepsis Labs: No results for input(s): PROCALCITON, LATICACIDVEN in the last 168 hours.  No results found for this or any previous visit (from the past 240 hour(s)).  Radiology Studies: No results found.  Scheduled Meds: . acetaminophen  500 mg Oral Q8H  . calcitonin (salmon)  1 spray Alternating Nares Daily  . cinacalcet  90 mg Oral Q supper  . darbepoetin (ARANESP) injection - DIALYSIS  60 mcg Intravenous Q Fri-HD  . diphenhydrAMINE  25 mg Intravenous Q4H while awake  . enoxaparin (LOVENOX) injection  30 mg Subcutaneous Q24H  . feeding supplement (NEPRO CARB STEADY)  237 mL Oral TID WC  . feeding supplement (PRO-STAT SUGAR FREE 64)  30 mL Oral BID  . fentaNYL  100 mcg Transdermal Q72H  . [  START ON 09/27/2016] ferric gluconate (FERRLECIT/NULECIT) IV  125 mg Intravenous Q Wed-HD  . gabapentin  300 mg Oral BID  . lidocaine   Topical TID  . LORazepam  1 mg Oral QHS  . methocarbamol  500 mg Oral Q8H  . multivitamin  1 tablet Oral QHS  . oxyCODONE  20 mg Oral Q4H  . potassium chloride  20 mEq Oral Daily  . senna  2 tablet Oral Daily  . sertraline  100 mg Oral Daily  . sodium thiosulfate infusion for calciphylaxis  25 g Intravenous Q M,W,F-HD   Continuous Infusions:    LOS: 13 days   Zannie Cove, MD [facility] Pager [phone]  If 7PM-7AM, please contact night-coverage [email or web address] Password [email or web address] 09/26/2016, 2:23 PM

## 2016-09-27 LAB — RENAL FUNCTION PANEL
Albumin: 2.6 g/dL — ABNORMAL LOW (ref 3.5–5.0)
Anion gap: 12 (ref 5–15)
BUN: 56 mg/dL — ABNORMAL HIGH (ref 6–20)
CO2: 27 mmol/L (ref 22–32)
Calcium: 7.1 mg/dL — ABNORMAL LOW (ref 8.9–10.3)
Chloride: 100 mmol/L — ABNORMAL LOW (ref 101–111)
Creatinine, Ser: 5.37 mg/dL — ABNORMAL HIGH (ref 0.44–1.00)
GFR calc Af Amer: 12 mL/min — ABNORMAL LOW (ref >60)
GFR calc non Af Amer: 10 mL/min — ABNORMAL LOW (ref >60)
Glucose, Bld: 106 mg/dL — ABNORMAL HIGH (ref 65–99)
Phosphorus: 5.5 mg/dL — ABNORMAL HIGH (ref 2.5–4.6)
Potassium: 5.4 mmol/L — ABNORMAL HIGH (ref 3.5–5.1)
Sodium: 139 mmol/L (ref 135–145)

## 2016-09-27 LAB — CBC
HCT: 28.9 % — ABNORMAL LOW (ref 36.0–46.0)
Hemoglobin: 8.7 g/dL — ABNORMAL LOW (ref 12.0–15.0)
MCH: 27.7 pg (ref 26.0–34.0)
MCHC: 30.1 g/dL (ref 30.0–36.0)
MCV: 92 fL (ref 78.0–100.0)
Platelets: 354 10*3/uL (ref 150–400)
RBC: 3.14 MIL/uL — ABNORMAL LOW (ref 3.87–5.11)
RDW: 23.7 % — ABNORMAL HIGH (ref 11.5–15.5)
WBC: 11.6 K/uL — ABNORMAL HIGH (ref 4.0–10.5)

## 2016-09-27 MED ORDER — LANTHANUM CARBONATE 500 MG PO CHEW
500.0000 mg | CHEWABLE_TABLET | Freq: Three times a day (TID) | ORAL | Status: DC
  Administered 2016-09-27 (×2): 500 mg via ORAL
  Filled 2016-09-27 (×4): qty 1

## 2016-09-27 MED ORDER — DIPHENHYDRAMINE HCL 50 MG/ML IJ SOLN
INTRAMUSCULAR | Status: AC
  Administered 2016-09-27: 25 mg via INTRAVENOUS
  Filled 2016-09-27: qty 1

## 2016-09-27 MED ORDER — ALTEPLASE 2 MG IJ SOLR
2.0000 mg | Freq: Once | INTRAMUSCULAR | Status: DC | PRN
Start: 2016-09-27 — End: 2016-09-27

## 2016-09-27 MED ORDER — SODIUM CHLORIDE 0.9 % IV SOLN: 100.0000 mL | INTRAVENOUS | Status: DC | PRN

## 2016-09-27 MED ORDER — OXYCODONE HCL 10 MG PO TABS: 30.0000 mg | ORAL_TABLET | ORAL | 0 refills | Status: DC | PRN

## 2016-09-27 MED ORDER — LIDOCAINE HCL (PF) 1 % IJ SOLN: 5.0000 mL | INTRAMUSCULAR | Status: DC | PRN

## 2016-09-27 MED ORDER — HEPARIN SODIUM (PORCINE) 1000 UNIT/ML DIALYSIS: 2000.0000 [IU] | Freq: Once | INTRAMUSCULAR | Status: DC

## 2016-09-27 MED ORDER — HYDROMORPHONE HCL 1 MG/ML IJ SOLN
0.5000 mg | INTRAMUSCULAR | Status: DC | PRN
  Administered 2016-09-27: 0.5 mg via INTRAVENOUS

## 2016-09-27 MED ORDER — HYDROMORPHONE HCL 1 MG/ML IJ SOLN
INTRAMUSCULAR | Status: AC
  Administered 2016-09-27: 0.5 mg via INTRAVENOUS
  Filled 2016-09-27: qty 1

## 2016-09-27 MED ORDER — LIDOCAINE-PRILOCAINE 2.5-2.5 % EX CREA: 1.0000 "application " | TOPICAL_CREAM | CUTANEOUS | Status: DC | PRN

## 2016-09-27 MED ORDER — SENNA 8.6 MG PO TABS: 2.0000 | ORAL_TABLET | Freq: Every day | ORAL | 0 refills | Status: DC

## 2016-09-27 MED ORDER — PENTAFLUOROPROP-TETRAFLUOROETH EX AERO: 1.0000 "application " | INHALATION_SPRAY | CUTANEOUS | Status: DC | PRN

## 2016-09-27 MED ORDER — FENTANYL 100 MCG/HR TD PT72: 100.0000 ug | MEDICATED_PATCH | TRANSDERMAL | 0 refills | Status: DC

## 2016-09-27 MED ORDER — GABAPENTIN 100 MG PO CAPS: 300.0000 mg | ORAL_CAPSULE | Freq: Every day | ORAL | 2 refills | Status: DC

## 2016-09-27 MED ORDER — HEPARIN SODIUM (PORCINE) 1000 UNIT/ML DIALYSIS: 1000.0000 [IU] | INTRAMUSCULAR | Status: DC | PRN

## 2016-09-27 MED ORDER — OXYCODONE HCL 5 MG PO TABS
30.0000 mg | ORAL_TABLET | ORAL | Status: DC
  Administered 2016-09-27 (×2): 30 mg via ORAL
  Filled 2016-09-27 (×2): qty 6

## 2016-09-27 MED ORDER — OXYCODONE HCL 5 MG PO TABS
ORAL_TABLET | ORAL | Status: AC
  Filled 2016-09-27: qty 6

## 2016-09-27 NOTE — Procedures (Signed)
Patient was seen on dialysis and the procedure was supervised.  BFR 400  Via PC BP is  113/70.   Patient appears to be tolerating treatment well  [name] Small A 09/27/2016

## 2016-09-27 NOTE — Progress Notes (Signed)
Physical Therapy Treatment Patient Details Name: [name] Small MRN: [id number] DOB: October 31, 1990 Today's Date: 09/27/2016    History of Present Illness [name] Small is a 25 y.o. female with ESRD secondary to polycystic kidney disease, chronic systolic heart failure status post AICD placement last EF measured in September 2017 was 12% and recently has had T-spine fractures presents to the ER because of increasing swelling of the right lower extremity with ulcerations and blisters.     PT Comments    Pt has progressed steadily, but will have to modify how she gets to/from HD in order to go home vs SNF.  Ordered w/c/cushion for times when pain too much to ambulate.  Pt will ask Mom to step up her assist.  Follow Up Recommendations  Home health PT     Equipment Recommendations  Wheelchair (measurements PT)    Recommendations for Other Services       Precautions / Restrictions Precautions Precautions: Fall;Back Precaution Comments: Patient able to state precautions. Required Braces or Orthoses: Spinal Brace Spinal Brace: Thoracolumbosacral orthotic Restrictions Weight Bearing Restrictions: No RUE Weight Bearing: Weight bearing as tolerated LUE Weight Bearing: Weight bearing as tolerated RLE Weight Bearing: Weight bearing as tolerated LLE Weight Bearing: Weight bearing as tolerated    Mobility  Bed Mobility Overal bed mobility: Modified Independent                Transfers Overall transfer level: Needs assistance   Transfers: Sit to/from Stand Sit to Stand: Supervision         General transfer comment: no assist needed, tentative due to pain  Ambulation/Gait Ambulation/Gait assistance: Min guard Ambulation Distance (Feet): 15 Feet (x2) Assistive device: Rolling walker (2 wheeled) Gait Pattern/deviations: Step-to pattern;Decreased step length - left;Decreased stance time - right;Decreased stride length Gait velocity: very slow Gait velocity interpretation:  Below normal speed for age/gender General Gait Details: pt unable to tolerate much w/bearing on the R LE, gait antalgic in nature.   Stairs            Wheelchair Mobility    Modified Rankin (Stroke Patients Only)       Balance Overall balance assessment: Needs assistance   Sitting balance-Leahy Scale: Good       Standing balance-Leahy Scale: Poor Standing balance comment: reliant on the RW                    Cognition Arousal/Alertness: Awake/alert Behavior During Therapy: WFL for tasks assessed/performed Overall Cognitive Status: Within Functional Limits for tasks assessed                      Exercises      General Comments General comments (skin integrity, edema, etc.): Pt will get w/c for negotiating distance too far to walk.   Pt to get Mom to help out with getting her to/from HD transport.      Pertinent Vitals/Pain Pain Assessment: Faces Faces Pain Scale: Hurts whole lot Pain Location: R LE Pain Descriptors / Indicators: Sharp;Sore;Burning;Grimacing Pain Intervention(s): Monitored during session;Repositioned;Limited activity within patient's tolerance    Home Living                      Prior Function            PT Goals (current goals can now be found in the care plan section) Acute Rehab PT Goals Patient Stated Goal: decreased pain PT Goal Formulation: With patient Time For Goal Achievement: 10/05/16 Potential  to Achieve Goals: Fair Progress towards PT goals: Progressing toward goals    Frequency    Min 3X/week      PT Plan Discharge plan needs to be updated    Co-evaluation             End of Session Equipment Utilized During Treatment: Gait belt;Back brace Activity Tolerance: Patient limited by pain Patient left: in bed;with call bell/phone within reach     Time: 1440-1536 PT Time Calculation (min) (ACUTE ONLY): 56 min  Charges:  $Gait Training: 23-37 mins $Therapeutic Activity: 8-22  mins $Self Care/Home Management: 8-22                    G CodesEliseo Gum:      Mckenzy Salazar V Sherita Decoste 09/27/2016, 3:48 PM  09/27/2016  [place] BingKen Zaylon Bossier, PT [phone]  (pager)

## 2016-09-27 NOTE — Care Management (Addendum)
      Armed forces training and education officer   Patient suffers from ____ESRD, CHF, severe Osteoporosis, pain_____________ which impairs their ability to perform daily activities like_Standing, walking______ in the home. A _____rolling walker,  cane_ will not resolve issue with standing, dressing, walking performing activities of daily living. A wheelchair will allow patient to safely perform daily activities. Patient is not able to propel themselves in the home using a standard weight wheelchair due to __pain, weakness, endurance. Patient can self propel in the lightweight wheelchair.    Height 5'3" Weight 109 lbs ICD-10-CM: N18.6, Z99.2 ICD-10-CM: Q78.2 ICD-10-CM: M79.604 ICD-10-CM: Z95.810   Isidoro Donning RN CCM Case Mgmt phone [phone]

## 2016-09-27 NOTE — Progress Notes (Signed)
MD notified regarding Fentanyl order that appeared to be discontinued. MD stated that order is not discontinued and that patient should continue to wear the patch for 72 hours and then another one is to be placed. Orders followed.

## 2016-09-27 NOTE — Progress Notes (Signed)
  Progress Note    09/27/2016 5:13 PM 12 Days Post-Op  Subjective:  Improving pain rle  Vitals:   09/27/16 1100 09/27/16 1106  BP: 102/79 125/85  Pulse: 90 91  Resp: 15 14  Temp:  98.6 F (37 C)    Physical Exam: rle dressings cdi  CBC    Component Value Date/Time   WBC 11.6 (H) 09/27/2016 0758   RBC 3.14 (L) 09/27/2016 0758   HGB 8.7 (L) 09/27/2016 0758   HCT 28.9 (L) 09/27/2016 0758   PLT 354 09/27/2016 0758   MCV 92.0 09/27/2016 0758   MCH 27.7 09/27/2016 0758   MCHC 30.1 09/27/2016 0758   RDW 23.7 (H) 09/27/2016 0758   LYMPHSABS 1.6 09/18/2016 0636   MONOABS 1.0 09/18/2016 0636   EOSABS 0.3 09/18/2016 0636   BASOSABS 0.0 09/18/2016 0636    BMET    Component Value Date/Time   NA 139 09/27/2016 0758   K 5.4 (H) 09/27/2016 0758   CL 100 (L) 09/27/2016 0758   CO2 27 09/27/2016 0758   GLUCOSE 106 (H) 09/27/2016 0758   BUN 56 (H) 09/27/2016 0758   CREATININE 5.37 (H) 09/27/2016 0758   CALCIUM 7.1 (L) 09/27/2016 0758   GFRNONAA 10 (L) 09/27/2016 0758   GFRAA 12 (L) 09/27/2016 0758    INR    Component Value Date/Time   INR 1.35 06/17/2016 0229     Intake/Output Summary (Last 24 hours) at 09/27/16 1713 Last data filed at 09/27/16 1235  Gross per 24 hour  Intake              420 ml  Output             1000 ml  Net             -580 ml     Assessment:  25 y.o. female is s/p right thigh graft excision with calciphylaxis  Plan: Will need local wound care at discharge Dialysis via left femoral catheter Can f/u with VVS on prn basis or with concerns about wounds   Brandon C. Randie Heinz, MD [facility] Office: [phone] Pager: [phone]  09/27/2016 5:13 PM

## 2016-09-27 NOTE — Progress Notes (Signed)
Dr. Jomarie Longs stated that PT should be working with the patient everyday in order to meet discharge needs. I stated that I talked with PT this morning and they will be working with the patient today. MD stated that she would put in a note for PT stating that they should work with her everyday. Will continue to monitor patient.

## 2016-09-27 NOTE — Discharge Summary (Addendum)
Physician Discharge Summary  Fredia SorrowVictoria Guyett UJW:[id number] DOB: [birth year] DOA: 09/13/2016  PCP: Jeanann LewandowskyJEGEDE, OLUGBEMIGA, MD  Admit date: 09/13/2016 Discharge date: 09/27/2016  Time spent: 35 minutes  Recommendations for Outpatient Follow-up:  1. Physical therapy 2. Wound care to R thigh AVG wound and small ulcers on both legs 3. She is DNR now   Discharge Diagnoses:  Principal Problem:   Edema of right lower extremity   Infected AVG   Calciphylaxis   ESRD (end stage renal disease) on dialysis (HCC)   Hypertension   AICD (automatic cardioverter/defibrillator) present   NICM (nonischemic cardiomyopathy) (HCC)   Cellulitis   Leg edema, right   Palliative care encounter   Calciphylaxis   Pain of right lower extremity   Palliative care by specialist   Advance care planning   Goals of care, counseling/discussion   DNR (do not resuscitate)   Uncontrolled pain   Severe osteopetrosis   Adjustment disorder with mixed anxiety and depressed mood   Fall   Tobacco abuse   Leukocytosis   Discharge Condition: stable  Diet recommendation: Renal  Filed Weights   09/26/16 2138 09/27/16 0653 09/27/16 1106  Weight: 50.8 kg (111 lb 15.9 oz) 51.7 kg (113 lb 15.7 oz) 49.6 kg (109 lb 5.6 oz)    History of present illness:  [place] Carter[place] a 25 y.o. 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 Recentlyhospitalized from 10/22-11/10/17with T7,T8,T12 vertebral compression fractures and volume overload related to noncompliance with HD. She had a new right femoral AVG placed during admission on 08/31/16.Graft has not been used for HD. Presented to the ER because of increasing swelling of the right lower extremity with  ulcerations and blisters. CT scan was done which was showing diffuse edema. Patient has had recent AV graft placed on right lower extremity and gets her dialysis done on the left lower extremity dialysis catheter. On exam patient has eschar over the recent AV graft placement on the right lower extremity and was noted to have ulcers in both lower legs  Hospital Course:   Right lower extremity edema with blisters and ulceration/infected AV graft Right thigh and Calciphylaxis - s/p graft removal and biopsy, seen by Vascular surgery this admission - Poorly healing right thigh AVG with skin necrosis-no evidence of active infection at this time - Cultures no growth to date  - was on empiric IV Vancomycin per VVS, completed 10days, stopped Vancomycin 12/2 - overall poor prognosis, also noted to have lower leg ulcers felt to be Calciphylaxis, has severe pain in both  lower legs at the site of these ulcers, she received Na-thiosulphate with HD for this - Palliative care consulted, was started on PCA pump for pain control and then transitioned over to high dose fentanyl and Oxycodone. Also had goals of care meeting, now DNR, but plans to continue HD - ambulating with PT, declined by CIR -continue wound care -needs ongoing Rehab -recommended to Fu with vascular post discharge  Hypotension - borderline low   -stable, monitor  ESRD on hemodialysis TTS  -stable and receiving inpatient HD per Renal  Nonischemic cardiomyopathy S/P AICD placement  -EF 06/2016 =12% -volume managed with HD  F6,O1,H08T7,T8,T12 vertebral compression fractures -supportive care, pain control -Physical therapy following  Chronic anemia - hemoglobin stable at baseline -stable  Severe protein calorie malnutrition -supplements added  Consultations:  Palliative  care  Vascular Dr.Cain  Renal   Discharge Exam: Vitals:   09/27/16 1100 09/27/16 1106  BP: 102/79 125/85  Pulse: 90 91  Resp: 15 14  Temp:  98.6 F (37 C)     General: AAOx3 Cardiovascular: S1S2/RRR Respiratory: CTAB  Discharge Instructions    Current Discharge Medication List    START taking these medications   Details  fentaNYL (DURAGESIC - DOSED MCG/HR) 100 MCG/HR Place 1 patch (100 mcg total) onto the skin every 3 (three) days. Qty: 5 patch, Refills: 0    senna (SENOKOT) 8.6 MG TABS tablet Take 2 tablets (17.2 mg total) by mouth daily. Qty: 10 each, Refills: 0      CONTINUE these medications which have CHANGED   Details  gabapentin (NEURONTIN) 100 MG capsule Take 3 capsules (300 mg total) by mouth at bedtime. Qty: 60 capsule, Refills: 2    Oxycodone HCl 10 MG TABS Take 3 tablets (30 mg total) by mouth every 4 (four) hours as needed. Qty: 60 tablet, Refills: 0   Associated Diagnoses: Chronic pain syndrome      CONTINUE these medications which have NOT CHANGED   Details  calcitonin, salmon, (MIACALCIN) 200 UNIT/ACT nasal spray Place 1 spray into alternate nostrils daily. Qty: 3.7 mL, Refills: 0    cinacalcet (SENSIPAR) 30 MG tablet Take 3 tablets (90 mg total) by mouth daily with supper. Qty: 180 tablet, Refills: 0    famotidine (PEPCID) 20 MG tablet Take 1 tablet (20 mg total) by mouth 2 (two) times daily as needed for heartburn or indigestion. Qty: 60 tablet, Refills: 0    lanthanum (FOSRENOL) 1000 MG chewable tablet Chew 1 tablet (1,000 mg total) by mouth 3 (three) times daily with meals. Qty: 90 tablet, Refills: 0    lidocaine (LIDODERM) 5 % Place 1 patch onto the skin daily. Remove & Discard patch within 12 hours or as directed by MD Qty: 30 patch, Refills: 0    LORazepam (ATIVAN) 1 MG tablet Take 1 tablet (1 mg total) by mouth at bedtime. Qty: 30 tablet, Refills: 1   Associated Diagnoses: Generalized anxiety disorder    methocarbamol (ROBAXIN) 500 MG tablet Take 1 tablet (500 mg total) by mouth every 6 (six) hours as needed for muscle spasms. Qty: 90 tablet, Refills: 0    multivitamin (RENA-VIT) TABS  tablet Take 1 tablet by mouth daily. Qty: 90 tablet, Refills: 3   Associated Diagnoses: Sickle cell anemia with pain (HCC)    sertraline (ZOLOFT) 100 MG tablet Take 1 tablet (100 mg total) by mouth daily. Qty: 30 tablet, Refills: 3      STOP taking these medications     calcitRIOL (ROCALTROL) 0.5 MCG capsule      naproxen (NAPROSYN) 500 MG tablet        Allergies  Allergen Reactions  . Aspirin Other (See Comments)    Interacts with Coreg  . Tramadol Anaphylaxis  . Vicodin [Hydrocodone-Acetaminophen] Hives  . Buprenorphine Hcl Itching    Ok with oxycodone  . Iohexol Itching  . Morphine And Related Itching and Other (See Comments)    Ok with oxycodone    Follow-up Information    JEGEDE, OLUGBEMIGA, MD. Schedule an appointment as soon as possible for a visit in 1 week(s).   Specialty:  Internal Medicine Contact information: [address] [place] [address] [phone]            The results of significant diagnostics from this hospitalization (including imaging, microbiology, ancillary and  laboratory) are listed below for reference.    Significant Diagnostic Studies: Ct Extrem Lower Wo Cm Bil  Result Date: 09/13/2016 CLINICAL DATA:  Acute onset of right leg swelling and large blister along the right lower leg. Right foot pain. Initial encounter. EXAM: CT OF THE LOWER BILATERAL EXTREMITY WITHOUT CONTRAST TECHNIQUE: Multidetector CT imaging of the lower bilateral extremity was performed according to the standard protocol. COMPARISON:  Left lower extremity venous Doppler ultrasound performed 01/25/2015 FINDINGS: Bones/Joint/Cartilage There is no evidence of fracture or dislocation. The visualized osseous structures appear intact. There is marked chronic deformity of the right pubic rami and right side of the pubic symphysis, with heterotopic bone formation. No definite focal osseous erosions are seen. The cartilage is not well assessed on CT. Ligaments Suboptimally  assessed by CT. Muscles and Tendons The visualized musculature is grossly unremarkable. The tendon structures are unremarkable in appearance. No intramuscular hematoma is seen. Soft tissues There is diffuse soft tissue edema involving the right hip and entirety of the right lower extremity, with a large 5.4 cm subcutaneous fluid-filled blister along the anterolateral right lower leg. Multiple smaller blisters are seen along the medial right thigh. The vasculature is not well assessed without contrast. Diffuse arterial calcification is noted throughout both lower extremities, with a vascular graft noted at the anterior right thigh. Small bilateral knee joint effusions are noted, slightly larger on the right. No abnormal focal fluid collections are seen to suggest abscess. IMPRESSION: 1. No evidence of fracture or dislocation. 2. Diffuse soft tissue edema involving the right hip and entirety of the right lower extremity, with large 5.4 cm fluid-filled blister along the anterolateral right lower leg. Multiple smaller blisters along the medial right thigh. No evidence of abscess. 3. Diffuse vascular calcifications seen. Vasculature not well assessed without contrast. 4. Small bilateral knee joint effusions, slightly larger on the right. Electronically Signed   By: Roanna Raider M.D.   On: 09/13/2016 23:30    Microbiology: No results found for this or any previous visit (from the past 240 hour(s)).   Labs: Basic Metabolic Panel:  Recent Labs Lab 09/21/16 0616 09/22/16 1330 09/24/16 0530 09/25/16 1349 09/27/16 0758  NA 138 144 143 141 139  K 3.8 4.9 4.6 5.6* 5.4*  CL 97* 106 103 105 100*  CO2 24 21* 26 22 27   GLUCOSE 85 119* 80 93 106*  BUN 20 47* 43* 71* 56*  CREATININE 3.68* 5.63* 4.87* 6.72* 5.37*  CALCIUM 7.3* 7.2* 7.2* 7.1* 7.1*  PHOS 1.9* 2.8 3.6 3.7 5.5*   Liver Function Tests:  Recent Labs Lab 09/21/16 0616 09/22/16 1330 09/24/16 0530 09/25/16 1349 09/27/16 0758  ALBUMIN 2.2*  2.2* 2.4* 2.7* 2.6*   No results for input(s): LIPASE, AMYLASE in the last 168 hours. No results for input(s): AMMONIA in the last 168 hours. CBC:  Recent Labs Lab 09/21/16 0616 09/22/16 1330 09/25/16 1349 09/27/16 0758  WBC 15.1* 14.7* 12.9* 11.6*  HGB 9.6* 8.8* 8.7* 8.7*  HCT 31.2* 28.3* 29.3* 28.9*  MCV 88.1 87.6 91.3 92.0  PLT 419* 399 405* 354   Cardiac Enzymes: No results for input(s): CKTOTAL, CKMB, CKMBINDEX, TROPONINI in the last 168 hours. BNP: BNP (last 3 results)  Recent Labs  09/29/15 1242 04/11/16 1130 07/26/16 1803  BNP 2,492.9* >4,500.0* >4,500.0*    ProBNP (last 3 results) No results for input(s): PROBNP in the last 8760 hours.  CBG: No results for input(s): GLUCAP in the last 168 hours.     Signed:  Zannie Cove MD.  [facility] 09/27/2016, 1:39 PM

## 2016-09-27 NOTE — Progress Notes (Addendum)
Daily Progress Note   Patient Name: [name] Small       Date: 09/27/2016 DOB: 15-Oct-1991  Age: 25 y.o. MRN#: [id number] Attending Physician: Zannie Cove, MD Primary Care Physician: Jeanann Lewandowsky, MD Admit Date: 09/13/2016  Reason for Consultation/Follow-up: Pain control  Subjective: [name] Small asks me why I am discontinuing the IV dilaudid, I believe she is afraid of having severe pain again.  PT is coming by to work with her.  She has to be able to walk down to the bus to go to HD and then from the bus back up to her apartment again.    Length of Stay: 14  Current Medications: Scheduled Meds:  . acetaminophen  500 mg Oral Q8H  . calcitonin (salmon)  1 spray Alternating Nares Daily  . cinacalcet  90 mg Oral Q supper  . darbepoetin (ARANESP) injection - DIALYSIS  60 mcg Intravenous Q Fri-HD  . diphenhydrAMINE  25 mg Intravenous Q4H while awake  . enoxaparin (LOVENOX) injection  30 mg Subcutaneous Q24H  . feeding supplement (NEPRO CARB STEADY)  237 mL Oral TID WC  . feeding supplement (PRO-STAT SUGAR FREE 64)  30 mL Oral BID  . fentaNYL  100 mcg Transdermal Q72H  . ferric gluconate (FERRLECIT/NULECIT) IV  125 mg Intravenous Q Wed-HD  . gabapentin  300 mg Oral BID  . lanthanum  500 mg Oral TID WC  . lidocaine   Topical TID  . LORazepam  1 mg Oral QHS  . methocarbamol  500 mg Oral Q8H  . multivitamin  1 tablet Oral QHS  . oxyCODONE  30 mg Oral Q4H  . senna  2 tablet Oral Daily  . sertraline  100 mg Oral Daily  . sodium thiosulfate infusion for calciphylaxis  25 g Intravenous Q M,W,F-HD    Continuous Infusions:   PRN Meds: acetaminophen, diphenhydrAMINE **OR** [DISCONTINUED] diphenhydrAMINE (BENADRYL) IVPB(SICKLE CELL ONLY), famotidine, naloxone **AND** sodium chloride flush,  ondansetron (ZOFRAN) IV  Physical Exam  Constitutional: She is oriented to person, place, and time.  Young petite female, lying in bed.  pleasant  HENT:  Head: Normocephalic and atraumatic.  Eyes: EOM are normal. No scleral icterus.  Neck: Neck supple. No thyromegaly present.  Cardiovascular: Regular rhythm.  Exam reveals no gallop and no friction rub.   No murmur heard. Pulmonary/Chest: Effort normal.  No respiratory distress.  Abdominal: Soft. She exhibits no distension.  Musculoskeletal: She exhibits no edema.  Neurological: She is alert and oriented to person, place, and time.  Skin: Skin is warm and dry.  Bandages on her right leg are clean and dry.  Psychiatric: She has a normal mood and affect. Judgment normal.           Vital Signs: BP 125/85   Pulse 91   Temp 98.6 F (37 C) (Oral)   Resp 14   Ht 5\' 3"  (1.6 m)   Wt 49.6 kg (109 lb 5.6 oz)   SpO2 100%   BMI 19.37 kg/m  SpO2: SpO2: 100 % O2 Device: O2 Device: Not Delivered O2 Flow Rate: O2 Flow Rate (L/min): 0 L/min  Intake/output summary:   Intake/Output Summary (Last 24 hours) at 09/27/16 1252 Last data filed at 09/27/16 1235  Gross per 24 hour  Intake              900 ml  Output             1000 ml  Net             -100 ml   LBM: Last BM Date: 09/27/16 Baseline Weight: Weight: 51.3 kg (113 lb) Most recent weight: Weight: 49.6 kg (109 lb 5.6 oz)       Palliative Assessment/Data:    Flowsheet Rows   Flowsheet Row Most Recent Value  Intake Tab  Referral Department  Hospitalist  Unit at Time of Referral  Med/Surg Unit  Palliative Care Primary Diagnosis  Nephrology  Date Notified  09/14/16  Palliative Care Type  New Palliative care  Reason for referral  Clarify Goals of Care, Pain, Psychosocial or Spiritual support  Date of Admission  09/13/16  Date first seen by Palliative Care  09/16/16  # of days Palliative referral response time  2 Day(s)  # of days IP prior to Palliative referral  1  Clinical  Assessment  Palliative Performance Scale Score  40%  Pain Max last 24 hours  10  Pain Min Last 24 hours  4  Dyspnea Max Last 24 Hours  0  Dyspnea Min Last 24 hours  0  Nausea Max Last 24 Hours  0  Nausea Min Last 24 Hours  0  Anxiety Max Last 24 Hours  5  Anxiety Min Last 24 Hours  2  Psychosocial & Spiritual Assessment  Palliative Care Outcomes  Patient/Family meeting held?  Yes  Who was at the meeting?  pt  Palliative Care Outcomes  Improved pain interventions, Provided advance care planning  Palliative Care follow-up planned  Yes, Facility      Patient Active Problem List   Diagnosis Date Noted  . Uncontrolled pain   . Severe osteopetrosis   . Adjustment disorder with mixed anxiety and depressed mood   . Fall   . Tobacco abuse   . Leukocytosis   . DNR (do not resuscitate)   . Pain of right lower extremity   . Palliative care by specialist   . Advance care planning   . Goals of care, counseling/discussion   . 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 Palliative care encounter   . Edema of right lower extremity 09/14/2016  . Leg edema, right 09/14/2016  . Cellulitis 09/13/2016  . Generalized anxiety disorder 09/05/2016  . Post-operative pain   . Labile blood pressure   . Closed nondisplaced fracture of pelvis (HCC)   . Other osteoporosis with current pathological fracture   .  Recurrent major depressive disorder, in partial remission (HCC)   . Acute blood loss anemia   . 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 Anemia of chronic disease   . H/O medication noncompliance   . 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 Acute on chronic systolic CHF (congestive heart failure) (HCC) 08/16/2016  . Noncompliance of patient with renal dialysis (HCC) 08/12/2016  . 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 Osteoporosis 08/01/2016  . Thoracic compression fracture - T7, T8, T12 07/27/2016  . Acute pulmonary edema (HCC) 07/26/2016  .  Essential hypertension 06/17/2016  . Depression with anxiety 06/17/2016  . Volume overload 04/14/2016  . Peripheral edema 04/14/2016  . NICM (nonischemic cardiomyopathy) (HCC) 04/14/2016  . Internal jugular (IJ) vein thromboembolism, acute (HCC) 04/14/2016  . Pericardial effusion 04/14/2016  . Chronic anticoagulation   . Hypertensive urgency 04/11/2016  . DVT (deep venous thrombosis), left 04/11/2016  . Cellulitis of left upper extremity 03/31/2016  . Chronic pain syndrome 02/29/2016  . Chronic systolic heart failure (HCC) 01/04/2016  . AICD (automatic cardioverter/defibrillator) present 01/04/2016  . 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 Pathologic pelvic fracture 01/04/2016  . Depression 01/04/2016  . Pain in the chest   . ESRD on dialysis (HCC)   . Tachycardia   . Elevated serum hCG   . Chest pain 10/11/2015  . Fluid overload 10/10/2015  . Hypertension 10/10/2015  . Anemia of chronic renal failure, stage 5 (HCC) 10/04/2015  . Thrombocytopenia (HCC) 10/04/2015  . Chest pain of uncertain etiology 10/04/2015  . Chronically Elevated troponin 03/03/2015  . Hyperkalemia 02/21/2015  . Acute on chronic systolic heart failure (HCC) 02/21/2012  . Pulmonary infarct (HCC) 02/07/2012  . ESRD (end stage renal disease) on dialysis (HCC) 02/03/2012  . PE (pulmonary embolism) 02/03/2012    Palliative Care Assessment & Plan   Patient Profile: 25 y.o. female  with past medical history of end-stage renal disease secondary to polycystic disease, status post transplant in 2008, transplant failure in 2012, nephrectomy in 2012, now on hemodialysis, sickle cell anemia, chronic systolic heart failure with AICD placement (EF September 2017 12%), PE DVT, opioid usage since age 25, noncompliance with hemodialysis and pain medicine admitted on 09/13/2016 with increased edema, and right lower extremity swelling and pain. Patient has been in the hospital 13 times in 2017. Patient has now acute on chronic  pain secondary to recent compression fractures at levels T7, T8, T12 as well as pelvis. Patient also went to the OR on 09/15/2016 for skin graft revision on the right. She is verbalizing back pain for the past 2 months and severe right leg pain 1 week. She states the pain is been so severe that she is only walking a few steps in her apartment. Her boyfriend comes over and does her dishes and cooking. She does not have a car and this is made it difficult for her to be compliant with some of her appointments as well as her worsening pain. She states her parents are very actively involved in her life and she describes a close relationship with them.   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  Attempting to control pain with Fentanyl patch and oral oxycodone  Recommendations/Plan:  Continue fentanyl 100 mcq.  Add oxy 30 scheduled.  DC IV dilaudid  DNR  CIR Recommended if that is not an option, she may need to consider SNF if she is unable to walk or have some help  at home.  Goals of Care and Additional Recommendations:  Limitations on Scope of Treatment: Full Scope Treatment with DNR/DNI  Code Status:  DNR  Prognosis:   < 6 months.  Per [employer] in studies involving ulcerative calciphylaxis there was 80% mortality in 6 months.  Discharge Planning:  To Be Determined  Planning for home with home health and palliative.  Care plan was discussed with Physical Therapy.  Thank you for allowing the Palliative Medicine Team to assist in the care of this patient.   Time In: 11:45 Time Out: 12:20 Total Time 35 Prolonged Time Billed no      Greater than 50%  of this time was spent counseling and coordinating care related to the above assessment and plan.  Algis Downs, PA-C Palliative Medicine   Please contact Palliative MedicineTeam phone at [phone] for questions and concerns.   Please see AMION for individual provider pager numbers.

## 2016-09-27 NOTE — Progress Notes (Signed)
[name] Progress Note   Subjective:  Asking for IV benedryl in HD- pain seems controlled- many people working on disposition for her- wants/needs to be able to walk prior to discharge  Objective Vitals:   09/27/16 0659 09/27/16 0704 09/27/16 0800 09/27/16 0830  BP: (!) 114/92  (!) 84/62 (!) 84/52  Pulse: (!) 109 100 97 94  Resp: 17 16 17 16   Temp:      TempSrc:      SpO2:      Weight:      Height:       Physical Exam General: Pleasant, chronically ill appearing female in NAD, facial edema present.  Heart: S1,S2, 3/6 systolic M. No R/G Lungs: BBS CTAB Abdomen: Abd soft, non-tender Extremities: trace BLE. Multiple wounds covered with allevyn dressings. Ace wrap RU thigh Dialysis Access: L fem TDC. Drsg intact.   Dialysis Orders: Dialysis Orders: MWF High POint 4h Opiflux 180 BFR 400/800 2K/2Ca EDW 49.5kg left fem TDC Heparin 2000 U IV Bolus q HD  Hectorol IV q HD  Aranesp 60 mcg IV q week    Additional Objective Labs: Basic Metabolic Panel:  Recent Labs Lab 09/24/16 0530 09/25/16 1349 09/27/16 0758  NA 143 141 139  K 4.6 5.6* 5.4*  CL 103 105 100*  CO2 26 22 27   GLUCOSE 80 93 106*  BUN 43* 71* 56*  CREATININE 4.87* 6.72* 5.37*  CALCIUM 7.2* 7.1* 7.1*  PHOS 3.6 3.7 5.5*   Liver Function Tests:  Recent Labs Lab 09/24/16 0530 09/25/16 1349 09/27/16 0758  ALBUMIN 2.4* 2.7* 2.6*   No results for input(s): LIPASE, AMYLASE in the last 168 hours. CBC:  Recent Labs Lab 09/21/16 0616 09/22/16 1330 09/25/16 1349 09/27/16 0758  WBC 15.1* 14.7* 12.9* 11.6*  HGB 9.6* 8.8* 8.7* 8.7*  HCT 31.2* 28.3* 29.3* 28.9*  MCV 88.1 87.6 91.3 92.0  PLT 419* 399 405* 354   Blood Culture    Component Value Date/Time   SDES WOUND RIGHT THIGH 09/15/2016 1734   SPECREQUEST PATIENT ON FOLLOWING ZOSYN NO ANAEROBIC SWAB SENT 09/15/2016 1734   CULT  09/15/2016 1734    RARE STAPHYLOCOCCUS SPECIES (COAGULASE NEGATIVE) CALL MICROBIOLOGY LAB IF  SENSITIVITIES ARE REQUIRED. NO ANAEROBES ISOLATED    REPTSTATUS 09/20/2016 FINAL 09/15/2016 1734    Cardiac Enzymes: No results for input(s): CKTOTAL, CKMB, CKMBINDEX, TROPONINI in the last 168 hours. CBG: No results for input(s): GLUCAP in the last 168 hours. Iron Studies: No results for input(s): IRON, TIBC, TRANSFERRIN, FERRITIN in the last 72 hours. @lablastinr3 @ Studies/Results: No results found. Medications:  . acetaminophen  500 mg Oral Q8H  . calcitonin (salmon)  1 spray Alternating Nares Daily  . cinacalcet  90 mg Oral Q supper  . darbepoetin (ARANESP) injection - DIALYSIS  60 mcg Intravenous Q Fri-HD  . diphenhydrAMINE  25 mg Intravenous Q4H while awake  . enoxaparin (LOVENOX) injection  30 mg Subcutaneous Q24H  . feeding supplement (NEPRO CARB STEADY)  237 mL Oral TID WC  . feeding supplement (PRO-STAT SUGAR FREE 64)  30 mL Oral BID  . fentaNYL  100 mcg Transdermal Q72H  . ferric gluconate (FERRLECIT/NULECIT) IV  125 mg Intravenous Q Wed-HD  . gabapentin  300 mg Oral BID  . heparin  2,000 Units Dialysis Once in dialysis  . lidocaine   Topical TID  . LORazepam  1 mg Oral QHS  . methocarbamol  500 mg Oral Q8H  . multivitamin  1 tablet Oral QHS  .  oxyCODONE  30 mg Oral Q4H  . potassium chloride  20 mEq Oral Daily  . senna  2 tablet Oral Daily  . sertraline  100 mg Oral Daily  . sodium thiosulfate infusion for calciphylaxis  25 g Intravenous Q M,W,F-HD     Assessment/Plan: 1. 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 2. ESRD-MWF HP via PC. For HD today via PC.  3. Anemia- hgb 8.7.. ESA - aranesp 150 mcg q week. Continue weekly venofer.   4. 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 Started Na thio TIW with HD, hold all vit D/ Ca products. Cont sensipar and non  Ca binders. Use low Ca bath. Last Ca 7.2 C Ca 8.5 09/24/13.  Pain control is major issue.   5.Sec HPTH- PTH 2 ,569  08/30/16 - Phos 3.6, now 5.5. Binders were on hold resume fosrenol at 500 tid. Stopped VDRA; on sensipar 90 mg q day. On calcitonin nasal spray daily. Also says sensipar makes her nauseated- needs to take with food 6. Hypotension/volume- still has peripheral and facial edema.  Closer to OP EDW. BP soft but pain meds may be contributing to lower BP. Attempt UF 1-2 liters tomorrow. Adjust EDW when DC'd.  7. Nutrition- alb 2.6, liberalized to regular diet. Start Prostat.   8. Symptom management- Palliative care has seen with recommendations for care outlined. Appreciate their involvement. Discussed transitioning to PO meds/ And now DNR 9. NICM s/p AICD - EF 20-25% 06/19/2016 10. Hypokalemia: K+ 4.6 has been getting KDur 20 meq daily. On regular diet. Watch K+. 10. Dispo- I think her prognosis is poor enough palliative could be involved. Palliative care following, recommends return to CIR for rehab. Is being seen by [facility] for rehab-discussed SNF placement which might be a viable option for pt as she would have someone to assist her and transportation to HD. Patient says boyfriend/parents help her but have never seen them visit. Dispo is big issue   Annie SableKellie Adrie Picking, MD 09/27/2016

## 2016-09-27 NOTE — Progress Notes (Signed)
[name] SorrowVictoria Small to be D/C'd Home per MD order.  Discussed prescriptions and follow up appointments with the patient. Prescriptions given to patient, medication list explained in detail. Pt verbalized understanding.    Medication List    STOP taking these medications   calcitRIOL 0.5 MCG capsule Commonly known as:  ROCALTROL   naproxen 500 MG tablet Commonly known as:  NAPROSYN     TAKE these medications   calcitonin (salmon) 200 UNIT/ACT nasal spray Commonly known as:  MIACALCIN Place 1 spray into alternate nostrils daily.   cinacalcet 30 MG tablet Commonly known as:  SENSIPAR Take 3 tablets (90 mg total) by mouth daily with supper.   famotidine 20 MG tablet Commonly known as:  PEPCID Take 1 tablet (20 mg total) by mouth 2 (two) times daily as needed for heartburn or indigestion.   fentaNYL 100 MCG/HR Commonly known as:  DURAGESIC - dosed mcg/hr Place 1 patch (100 mcg total) onto the skin every 3 (three) days. Start taking on:  09/29/2016   gabapentin 100 MG capsule Commonly known as:  NEURONTIN Take 3 capsules (300 mg total) by mouth at bedtime. What changed:  how much to take  when to take this   lanthanum 1000 MG chewable tablet Commonly known as:  FOSRENOL Chew 1 tablet (1,000 mg total) by mouth 3 (three) times daily with meals.   lidocaine 5 % Commonly known as:  LIDODERM Place 1 patch onto the skin daily. Remove & Discard patch within 12 hours or as directed by MD   LORazepam 1 MG tablet Commonly known as:  ATIVAN Take 1 tablet (1 mg total) by mouth at bedtime.   methocarbamol 500 MG tablet Commonly known as:  ROBAXIN Take 1 tablet (500 mg total) by mouth every 6 (six) hours as needed for muscle spasms.   multivitamin Tabs tablet Take 1 tablet by mouth daily.   Oxycodone HCl 10 MG Tabs Take 3 tablets (30 mg total) by mouth every 4 (four) hours as needed. What changed:  how much to take   senna 8.6 MG Tabs tablet Commonly known as:  SENOKOT Take 2  tablets (17.2 mg total) by mouth daily. Start taking on:  09/28/2016   sertraline 100 MG tablet Commonly known as:  ZOLOFT Take 1 tablet (100 mg total) by mouth daily.            Durable Medical Equipment        Start     Ordered   09/27/16 1543  For home use only DME Other see comment  Once    Comments:  Gel cushion for wheelchair, diagnosis Calciphylaxis   09/27/16 1543   09/27/16 1450  For home use only DME lightweight manual wheelchair with seat cushion  Once    Comments:  Patient suffers from ESRD, CHF, chronic pain, Severe Osteoporosis which impairs their ability to perform daily activities like stand and walk in the home.  A rolling walker will not resolve  issue with performing activities of daily living. A wheelchair will allow patient to safely perform daily activities. Patient is not able to propel themselves in the home using a standard weight wheelchair due to weakness and pain. Patient can self propel in the lightweight wheelchair.  Accessories: elevating leg rests (ELRs), wheel locks, extensions and anti-tippers.   09/27/16 1452      Vitals:   09/27/16 1106 09/27/16 1808  BP: 125/85 138/79  Pulse: 91 88  Resp: 14 16  Temp: 98.6 F (37 C) 97.9  F (36.6 C)    Skin clean, dry and intact without evidence of skin break down, no evidence of skin tears noted. IV catheter discontinued intact. Site without signs and symptoms of complications. Dressing and pressure applied. Pt denies pain at this time. No complaints noted.  An After Visit Summary was printed and given to the patient. Patient escorted via personal WC, and D/C home via private auto.  Britt Bolognese RN, BSN

## 2016-09-27 NOTE — Care Management Note (Signed)
Case Management Note  Patient Details  Name: [name] [facility] MRN: [id number] Date of Birth: May 07, 1991  Subjective/Objective:   ESRD, infected AV graft, chronic pain, Calciphylaxis                 Action/Plan: Discharge Planning: AVS reviewed:  NCM spoke to pt at bedside. States she lives in home with her boyfriend who works during the day. States she uses Medicaid Transportation to get to her appts. She has RW. Requesting a wheelchair to use in the home. Contacted [facility] DME rep for wheelchair for home. Pt Home Health arranged with [facility]. Will contact HH Liaison to make aware of dc home today. Discussed with pt going to SNF-rehab. Explained it will be for short period until she has regained her strength. Pt refusing SNF-rehab. Contacted attending scheduled dc to home with [facility].     PCP Jeanann Lewandowsky E MD  Expected Discharge Date:  09/27/2016              Expected Discharge Plan:  Home w Home Health Services  In-House Referral:  Clinical Social Work  Discharge planning Services  CM Consult  Post Acute Care Choice:  Home Health, Hospice (Home with [facility]) Choice offered to:  Patient  DME Arranged:  Community education officer wheelchair with seat cushion DME Agency:  [facility]  HH Arranged:  RN, PT, Nurse's Aide, OT, Disease Management HH Agency:  [facility], [facility]iative Services)  Status of Service:  Completed, signed off  If discussed at Long Length of Stay Meetings, dates discussed:    Additional Comments:  Elliot Cousin, RN 09/27/2016, 2:53 PM

## 2016-10-03 ENCOUNTER — Ambulatory Visit (INDEPENDENT_AMBULATORY_CARE_PROVIDER_SITE_OTHER): Payer: Medicare Other | Admitting: Internal Medicine

## 2016-10-03 ENCOUNTER — Telehealth: Payer: Self-pay | Admitting: Internal Medicine

## 2016-10-03 ENCOUNTER — Encounter: Payer: Self-pay | Admitting: Internal Medicine

## 2016-10-03 VITALS — BP 112/76 | HR 61 | Temp 98.7°F | Resp 18 | Ht 63.0 in | Wt 110.0 lb

## 2016-10-03 DIAGNOSIS — F411 Generalized anxiety disorder: Secondary | ICD-10-CM

## 2016-10-03 DIAGNOSIS — L97921 Non-pressure chronic ulcer of unspecified part of left lower leg limited to breakdown of skin: Secondary | ICD-10-CM

## 2016-10-03 DIAGNOSIS — N186 End stage renal disease: Secondary | ICD-10-CM

## 2016-10-03 DIAGNOSIS — G894 Chronic pain syndrome: Secondary | ICD-10-CM

## 2016-10-03 DIAGNOSIS — Z992 Dependence on renal dialysis: Secondary | ICD-10-CM | POA: Diagnosis not present

## 2016-10-03 MED ORDER — OXYCODONE HCL 10 MG PO TABS: 30.0000 mg | ORAL_TABLET | ORAL | 0 refills | Status: DC | PRN

## 2016-10-03 MED FILL — oxyCODONE HCL 10 MG TABS: 10 | 10 days supply | Qty: 60 | Fill #0

## 2016-10-03 MED FILL — LORazepam 1 MG TABS: 1 | 30 days supply | Qty: 30 | Fill #1

## 2016-10-03 NOTE — Patient Instructions (Signed)
How to Change Your Dressing Introduction A dressing is a material that is placed in and over wounds. A dressing helps your wound to heal by protecting it from:  Bacteria.  Worse injury.  Being too dry or too wet. What are the risks? The sticky (adhesive) tape that is used with a dressing may make your skin sore or irritated, or it may cause a rash. These are the most common problems. However, more serious problems can develop, such as:  Bleeding.  Infection. How to change your dressing Getting Ready to Change Your Dressing   Take a shower before you do the first dressing change of the day. If your doctor does not want your wound to get wet and your dressing is not waterproof, you may need to put plastic leak-proof sealing wrap on your dressing to protect it.  If needed, take pain medicine as told by your doctor 30 minutes before you change your dressing.  Set up a clean station for wound care. You will need:  A plastic trash bag that is open and ready to use.  Hand sanitizer.  Wound cleanser or salt-water solution (saline) as told by your doctor.  New dressing material or bandages. Make sure to open the dressing package so the dressing stays on the inside of the package. You may also need these supplies in your clean station:  A box of vinyl gloves.  Tape.  Skin protectant. This may be a wipe, film, or spray.  Clean or germ-free (sterile) scissors.  A cotton-tipped applicator. Taking Off Your Old Dressing  Wash your hands with soap and water. Dry your hands with a clean towel. If you cannot use soap and water, use hand sanitizer.  If you are using gloves, put on the gloves before you take off the dressing.  Gently take off any adhesive or tape by pulling it off in the direction of your hair growth. Only touch the outside edges of the dressing.  Take off the dressing. If the dressing sticks to your skin, wet the dressing with a germ-free salt-water solution. This  helps it come off more easily.  Take off any gauze or packing in your wound.  Throw the old dressing supplies into the ready trash bag.  Take off your gloves. To take off each glove, grab the cuff with your other hand and turn the glove inside out. Put the gloves in the trash right away.  Wash your hands with soap and water. Dry your hands with a clean towel. If you cannot use soap and water, use hand sanitizer. Cleaning Your Wound  Follow instructions from your doctor about how to clean your wound. This may include using a salt-water solution or recommended wound cleanser.  Do not use over-the-counter medicated or antiseptic creams, sprays, liquids, or dressings unless your doctor tells you to do that.  Use a clean gauze pad to clean the area fully with the salt-water solution or wound cleanser that your doctor recommends.  Throw the gauze pad into the trash bag.  Wash your hands with soap and water. Dry your hands with a clean towel. If you cannot use soap and water, use hand sanitizer. Putting on the Dressing  If your doctor recommended a skin protectant, put it on the skin around the wound.  Cover the wound with the recommended dressing, such as a nonstick gauze or bandage. Make sure to touch only the outside edges of the dressing. Do not touch the inside of the dressing.  Attach   the dressing so all sides stay in place. You may do this with the attached medical adhesive, roll gauze, or tape. If you use tape, do not wrap the tape all the way around your arm or leg.  Take off your gloves. Put them in the trash bag with the old dressing. Tie the bag shut and throw it away.  Wash your hands with soap and water. Dry your hands with a clean towel. If you cannot use soap and water, use hand sanitizer. Get help if:   You have new pain.  You have irritation, a rash, or itching around the wound or dressing.  Changing your dressing is painful.  Changing your dressing causes a lot of  bleeding. Get help right away if:  You have very bad pain.  You have signs of infection, such as:  More redness, swelling, or pain.  More fluid or blood.  Warmth.  Pus or a bad smell.  Red streaks leading from wound.  A fever. This information is not intended to replace advice given to you by your health care provider. Make sure you discuss any questions you have with your health care provider. Document Released: 01/05/2009 Document Revised: 03/16/2016 Document Reviewed: 07/15/2015  2017 Elsevier End-Stage Kidney Disease End-stage kidney disease occurs when the kidneys are so damaged that they cannot do their job. The kidneys are two organs that do many important jobs in the body, which include:  Removing wastes and extra fluids from the blood.  Making hormones that maintain the amount of fluid in your tissues and blood vessels.  Maintaining the right amount of fluids and chemicals in the body. When the kidneys are damaged and cannot do their job, life-threatening problems occur. Without the help of the kidneys, toxins build up in the blood. In end-stage kidney disease, the kidneys cannot get better. What are the causes? End-stage kidney disease usually occurs when a long-lasting (chronic) kidney disease gets worse. It may also occur after the kidneys are suddenly damaged (acute kidney injury). What increases the risk? This condition is more likely to develop in people who are:  Older than age 25.  Female.  Of African-American descent.  Current smokers or former smokers.  Obese. You may also have an increased risk for end-stage kidney disease if you:  Have a family history of chronic kidney disease (CKD).  Have had kidney disease for many years.  Have other longstanding medical conditions that affect the kidneys, such as:  Cardiovascular disease including high blood pressure.  Diabetes.  Certain diseases that affect the immune system. What are the signs or  symptoms?  Swelling (edema) of the face, legs, ankles, or feet.  Numbness, tingling, or loss of feeling (sensation) in your hands or feet.  Tiredness (lethargy).  Nausea or vomiting.  Confusion, trouble concentrating, or loss of consciousness.  Chest pain.  Shortness of breath.  Little to no urine production.  Muscle twitches and cramps, especially in the legs.  Constant itchiness.  Loss of appetite.  Pale skin and tissue lining your eyelids (conjunctiva).  Headaches.  Abnormally dark or light skin.  Decrease in muscle size (muscle wasting).  Easy bruising.  Frequent hiccups.  Stopping of menstruation in women.  Seizures. How is this diagnosed? Your health care provider will measure your blood pressure and do some tests. These may include:  Urine tests.  Blood tests.  Imaging tests.  A test in which a sample of tissue is removed from the kidneys to be looked at under  a microscope (kidney biopsy). How is this treated? There are two treatments for end-stage kidney disease:  A procedure that removes toxic wastes from the body (dialysis). Depending on the type of dialysis you choose, it may be performed more than one time a day (peritoneal dialysis) or several times a week (hemodialysis).  Surgery toreceive a new kidney (kidney transplant). In addition to having dialysis or a kidney transplant, you may need to take medicines:  To control high blood pressure (hypertension).  To control cholesterol.  To maintain healthy electrolyte levels in your blood. You may also be given a specific diet to follow that includes requirements or limits for:  Salt (sodium).  Protein.  Phosphorous.  Potassium.  Calcium. Follow these instructions at home:  Follow your prescribed diet.  Take over-the-counter and prescription medicines only as told by your health care provider.  Do not take any new medicines unless approved by your health care provider. Many  medicines can worsen your kidney damage.  Do not take any vitamin and mineral supplements unless approved by your health care provider. Many nutritional supplements can worsen your kidney damage.  The dose of some medicines that you take may need to be adjusted.  Do not use any tobacco products, such as cigarettes, chewing tobacco, and e-cigarettes. If you need help quitting, ask your health care provider.  Keep all follow-up visits as told by your health care provider. This is important.  Keep track of your blood pressure. Report changes in your blood pressure as told by your health care provider.  Achieve and maintain a healthy weight. If you need help with this, ask your health care provider.  Start or continue an exercise plan. Try to exercise at least 30 minutes a day, 5 days a week.  Stay current with immunizations as told by your health care provider. Where to find more information:  American Association of Kidney Patients: ResidentialShow.is  [employer]: www.kidney.org  [employer]: FightingMatch.com.ee  [employer] [facility]: www.lifeoptions.org and www.kidneyschool.org Contact a health care provider if:  Your symptoms get worse.  You develop new symptoms. Get help right away if:  You have weakness in an arm or leg on one side of your body.  You have difficulty speaking or you are slurring your speech.  You have a sudden change in your vision.  You have a sudden, severe headache.  You have a sudden weight increase.  You have difficulty breathing.  Your symptoms suddenly get worse. This information is not intended to replace advice given to you by your health care provider. Make sure you discuss any questions you have with your health care provider. Document Released: 12/30/2003 Document Revised: 03/16/2016 Document Reviewed: 06/07/2012 Elsevier Interactive Patient Education  2017 [employer].

## 2016-10-03 NOTE — Progress Notes (Signed)
[name] SorrowVictoria Figuereo, is a 25 y.o. female  [id number]  MWU:[id number]  DOB - July 20, 1991  Chief Complaint  Patient presents with  . Follow-up       Subjective:   [name] Small is a 25 y.o. female with multiple complex medical conditions including end-stage renal disease on hemodialysis, pulmonary embolism, congestive heart failure status post AICD placement, generalized anxiety disorder, chronic pain syndrome and hypertension here today for a hospital discharge follow up visit. 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 She was also in a lot of pain, necessitating palliative care consult, she has been started on Fentanyl patches and increased oxycodone to 30 mg 3x per day. She is here today to follow up. Wounds on the leg is dry but the one on right thigh continues to discharge, no foul smell per patient. Daily wound dressing at home, no fever, No urinary symptom. Patient is on wheelchair and trunk brace from fracture of the thoracic spine. She is now DNR. Patient has No headache, No chest pain, No abdominal pain - No Nausea, No new weakness tingling or numbness, No Cough - SOB.  Problem  Ulcer of Left Lower Extremity, Limited to Breakdown of Skin (Hcc)    ALLERGIES: Allergies  Allergen Reactions  . Aspirin Other (See Comments)    Interacts with Coreg  . Tramadol Anaphylaxis  . Vicodin [Hydrocodone-Acetaminophen] Hives  . Buprenorphine Hcl Itching    Ok with oxycodone  . Iohexol Itching  . Morphine And Related Itching and Other (See Comments)    Ok with oxycodone     PAST MEDICAL HISTORY: Past Medical History:  Diagnosis Date  . AICD (automatic cardioverter/defibrillator) present 12/16/14   [employer]  . Anemia   . Anxiety   . Cardiomyopathy   . Cellulitis and abscess of face 03/22/2013  . CHF (congestive heart failure) (HCC)   . Chronic anticoagulation   . Chronic  pain   . Depression   . DVT (deep venous thrombosis) (HCC) ~ 2014   BLE  . Dysrhythmia    at times per pt.  . 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  [facility]. ESRD (end stage renal disease) on dialysis [facility](HCC)    "MWF; [facility] " (09/2016)  . Fracture, thoracic vertebra (HCC) 07/2016   "T7-T12"  . GERD (gastroesophageal reflux disease)   . H/O transfusion of packed red blood cells   . Heart murmur   . Hypertension   . Narcotic abuse, continuous   . Osteoporosis   . Pelvic fracture (HCC) 06/2015   "on the right"  . Pulmonary emboli (HCC) 01/2012   Bilateral, moderate clot burden, areas of pulmonary infarction and central necrosis  . Sickle cell anemia (HCC)     MEDICATIONS AT HOME: Prior to Admission medications   Medication Sig Start Date End Date Taking? Authorizing Provider  calcitonin, salmon, (MIACALCIN) 200 UNIT/ACT nasal spray Place 1 spray into alternate nostrils daily. 09/01/16  Yes Daniel J Angiulli, PA-C  cinacalcet (SENSIPAR) 30 MG tablet Take 3 tablets (90 mg total) by mouth daily with supper. 09/01/16  Yes Daniel J Angiulli, PA-C  famotidine (PEPCID) 20 MG tablet Take 1 tablet (20 mg total) by mouth 2 (two) times daily as needed for heartburn or indigestion. 09/06/16  Yes Ankit Karis JubaAnil Patel, MD  fentaNYL (DURAGESIC - DOSED  MCG/HR) 100 MCG/HR Place 1 patch (100 mcg total) onto the skin every 3 (three) days. 09/29/16  Yes Zannie Cove, MD  gabapentin (NEURONTIN) 100 MG capsule Take 3 capsules (300 mg total) by mouth at bedtime. 09/27/16  Yes Zannie Cove, MD  lanthanum (FOSRENOL) 1000 MG chewable tablet Chew 1 tablet (1,000 mg total) by mouth 3 (three) times daily with meals. 09/01/16  Yes Daniel J Angiulli, PA-C  lidocaine (LIDODERM) 5 % Place 1 patch onto the skin daily. Remove & Discard patch within 12 hours or as directed by MD 09/01/16  Yes  Mcarthur Rossetti Angiulli, PA-C  LORazepam (ATIVAN) 1 MG tablet Take 1 tablet (1 mg total) by mouth at bedtime. 09/05/16  Yes Quentin Angst, MD  methocarbamol (ROBAXIN) 500 MG tablet Take 1 tablet (500 mg total) by mouth every 6 (six) hours as needed for muscle spasms. 09/01/16  Yes Daniel J Angiulli, PA-C  multivitamin (RENA-VIT) TABS tablet Take 1 tablet by mouth daily. 09/01/16  Yes Daniel J Angiulli, PA-C  Oxycodone HCl 10 MG TABS Take 3 tablets (30 mg total) by mouth every 4 (four) hours as needed. 10/03/16  Yes Quentin Angst, MD  senna (SENOKOT) 8.6 MG TABS tablet Take 2 tablets (17.2 mg total) by mouth daily. 09/28/16  Yes Zannie Cove, MD  sertraline (ZOLOFT) 100 MG tablet Take 1 tablet (100 mg total) by mouth daily. 09/01/16  Yes Daniel J Angiulli, PA-C    Objective:   Vitals:   10/03/16 1039  BP: 112/76  Pulse: 61  Resp: 18  Temp: 98.7 F (37.1 C)  TempSrc: Oral  SpO2: 95%  Weight: 110 lb (49.9 kg)  Height: 5\' 3"  (1.6 m)   Exam General appearance : Awake, alert, not in any distress. Speech Clear. Chronically ill-looking, round face HEENT: Atraumatic and Normocephalic, pupils equally reactive to light and accomodation Neck: Supple, no JVD. No cervical lymphadenopathy.  Chest: Good air entry bilaterally, no added sounds. Trunk brace in situ CVS: S1 S2 regular, ++ murmurs.  Abdomen: Bowel sounds present, Non tender and not distended with no gaurding, rigidity or rebound. Extremities: Fistulae on both upper arm, B/L Lower Ext shows no edema, multiple ulcers with eschars   Neurology: Awake alert, and oriented X 3, CN II-XII intact, Non focal   Data Review Lab Results  Component Value Date   HGBA1C 5.5 06/17/2016   HGBA1C 5.8 (H) 01/10/2016   HGBA1C 5.8 (H) 04/03/2015    Assessment & Plan   1. ESRD on dialysis [facility])  - Amb Referral to Palliative Care  2. Generalized anxiety disorder  - Amb Referral to Palliative Care  3. Chronic pain syndrome  -  Oxycodone HCl 10 MG TABS; Take 3 tablets (30 mg total) by mouth every 4 (four) hours as needed.  Dispense: 60 tablet; Refill: 0 - Amb Referral to Palliative Care  4. Ulcer of left lower extremity, limited to breakdown of skin (HCC)  - AMB referral to wound care center  Return in about 2 months (around 12/04/2016) for CKD/ESRD, Follow up HTN.  The patient was given clear instructions to go to ER or return to medical center if symptoms don't improve, worsen or new problems develop. The patient verbalized understanding. The patient was told to call to get lab results if they haven't heard anything in the next week.   This note has been created with Education officer, environmental. Any transcriptional errors are unintentional.    Shalev Helminiak, MD, MHA, FACP,  Constance Goltz, CPE [facility] and [facility], [place] [phone]   10/03/2016, 11:34 AM

## 2016-10-03 NOTE — Telephone Encounter (Signed)
[name] Small from [facility] called to speak with PCP in order to get verbal order for physical therapy that would be twice a week for 3 weeks starting next week. Please follow up.  Thank you.

## 2016-10-03 NOTE — Progress Notes (Signed)
Patient is here for FU  Patient complains of wound site pain and back pain.  Patient has taken medication today. Patient has eaten today.

## 2016-10-04 ENCOUNTER — Encounter: Payer: Self-pay | Admitting: Cardiology

## 2016-10-05 ENCOUNTER — Telehealth: Payer: Self-pay | Admitting: Internal Medicine

## 2016-10-05 NOTE — Telephone Encounter (Signed)
Josh, nurse from [facility], called to speak with discuss pain regimen. Please follow up.  Thank you.

## 2016-10-06 ENCOUNTER — Encounter: Payer: Medicare Other | Attending: Physical Medicine & Rehabilitation | Admitting: Physical Medicine & Rehabilitation

## 2016-10-06 NOTE — Telephone Encounter (Signed)
MA spoke with Wynona Canes.  Verbal Order for PT was provided for the past starting next week.

## 2016-10-09 ENCOUNTER — Inpatient Hospital Stay (HOSPITAL_COMMUNITY)
Admission: AD | Admit: 2016-10-09 | Discharge: 2016-10-12 | DRG: 640 | Disposition: A | Discharge status: Home / Self Care | Payer: Medicare Other | Source: Other Acute Inpatient Hospital | Attending: Internal Medicine | Admitting: Internal Medicine

## 2016-10-09 DIAGNOSIS — N185 Chronic kidney disease, stage 5: Secondary | ICD-10-CM

## 2016-10-09 DIAGNOSIS — I313 Pericardial effusion (noninflammatory): Secondary | ICD-10-CM | POA: Diagnosis present

## 2016-10-09 DIAGNOSIS — K219 Gastro-esophageal reflux disease without esophagitis: Secondary | ICD-10-CM | POA: Diagnosis present

## 2016-10-09 DIAGNOSIS — I5023 Acute on chronic systolic (congestive) heart failure: Secondary | ICD-10-CM | POA: Diagnosis present

## 2016-10-09 DIAGNOSIS — I132 Hypertensive heart and chronic kidney disease with heart failure and with stage 5 chronic kidney disease, or end stage renal disease: Secondary | ICD-10-CM | POA: Diagnosis present

## 2016-10-09 DIAGNOSIS — D571 Sickle-cell disease without crisis: Secondary | ICD-10-CM | POA: Diagnosis present

## 2016-10-09 DIAGNOSIS — R079 Chest pain, unspecified: Secondary | ICD-10-CM | POA: Diagnosis present

## 2016-10-09 DIAGNOSIS — J189 Pneumonia, unspecified organism: Secondary | ICD-10-CM | POA: Diagnosis present

## 2016-10-09 DIAGNOSIS — R0602 Shortness of breath: Secondary | ICD-10-CM

## 2016-10-09 DIAGNOSIS — Z9115 Patient's noncompliance with renal dialysis: Secondary | ICD-10-CM

## 2016-10-09 DIAGNOSIS — Z95810 Presence of automatic (implantable) cardiac defibrillator: Secondary | ICD-10-CM

## 2016-10-09 DIAGNOSIS — Z66 Do not resuscitate: Secondary | ICD-10-CM | POA: Diagnosis present

## 2016-10-09 DIAGNOSIS — T8612 Kidney transplant failure: Secondary | ICD-10-CM | POA: Diagnosis present

## 2016-10-09 DIAGNOSIS — I428 Other cardiomyopathies: Secondary | ICD-10-CM

## 2016-10-09 DIAGNOSIS — S22000S Wedge compression fracture of unspecified thoracic vertebra, sequela: Secondary | ICD-10-CM

## 2016-10-09 DIAGNOSIS — I209 Angina pectoris, unspecified: Secondary | ICD-10-CM | POA: Diagnosis not present

## 2016-10-09 DIAGNOSIS — Z905 Acquired absence of kidney: Secondary | ICD-10-CM

## 2016-10-09 DIAGNOSIS — I10 Essential (primary) hypertension: Secondary | ICD-10-CM | POA: Diagnosis present

## 2016-10-09 DIAGNOSIS — Q613 Polycystic kidney, unspecified: Secondary | ICD-10-CM

## 2016-10-09 DIAGNOSIS — E8889 Other specified metabolic disorders: Secondary | ICD-10-CM | POA: Diagnosis present

## 2016-10-09 DIAGNOSIS — N186 End stage renal disease: Secondary | ICD-10-CM | POA: Diagnosis present

## 2016-10-09 DIAGNOSIS — Z8271 Family history of polycystic kidney: Secondary | ICD-10-CM

## 2016-10-09 DIAGNOSIS — M810 Age-related osteoporosis without current pathological fracture: Secondary | ICD-10-CM | POA: Diagnosis present

## 2016-10-09 DIAGNOSIS — D631 Anemia in chronic kidney disease: Secondary | ICD-10-CM | POA: Diagnosis present

## 2016-10-09 DIAGNOSIS — L97929 Non-pressure chronic ulcer of unspecified part of left lower leg with unspecified severity: Secondary | ICD-10-CM | POA: Diagnosis present

## 2016-10-09 DIAGNOSIS — R0789 Other chest pain: Secondary | ICD-10-CM

## 2016-10-09 DIAGNOSIS — Z8249 Family history of ischemic heart disease and other diseases of the circulatory system: Secondary | ICD-10-CM

## 2016-10-09 DIAGNOSIS — F1120 Opioid dependence, uncomplicated: Secondary | ICD-10-CM | POA: Diagnosis present

## 2016-10-09 DIAGNOSIS — Y830 Surgical operation with transplant of whole organ as the cause of abnormal reaction of the patient, or of later complication, without mention of misadventure at the time of the procedure: Secondary | ICD-10-CM | POA: Diagnosis present

## 2016-10-09 DIAGNOSIS — Z9119 Patient's noncompliance with other medical treatment and regimen: Secondary | ICD-10-CM | POA: Diagnosis not present

## 2016-10-09 DIAGNOSIS — I150 Renovascular hypertension: Secondary | ICD-10-CM

## 2016-10-09 DIAGNOSIS — E875 Hyperkalemia: Secondary | ICD-10-CM | POA: Diagnosis present

## 2016-10-09 DIAGNOSIS — Z886 Allergy status to analgesic agent status: Secondary | ICD-10-CM

## 2016-10-09 DIAGNOSIS — I5022 Chronic systolic (congestive) heart failure: Secondary | ICD-10-CM | POA: Diagnosis present

## 2016-10-09 DIAGNOSIS — Z885 Allergy status to narcotic agent status: Secondary | ICD-10-CM

## 2016-10-09 DIAGNOSIS — I82519 Chronic embolism and thrombosis of unspecified femoral vein: Secondary | ICD-10-CM | POA: Diagnosis present

## 2016-10-09 DIAGNOSIS — S22000A Wedge compression fracture of unspecified thoracic vertebra, initial encounter for closed fracture: Secondary | ICD-10-CM | POA: Diagnosis present

## 2016-10-09 DIAGNOSIS — Z79899 Other long term (current) drug therapy: Secondary | ICD-10-CM

## 2016-10-09 DIAGNOSIS — Z720 Tobacco use: Secondary | ICD-10-CM | POA: Diagnosis not present

## 2016-10-09 DIAGNOSIS — Z86711 Personal history of pulmonary embolism: Secondary | ICD-10-CM

## 2016-10-09 DIAGNOSIS — Z992 Dependence on renal dialysis: Secondary | ICD-10-CM

## 2016-10-09 DIAGNOSIS — E8770 Fluid overload, unspecified: Secondary | ICD-10-CM | POA: Diagnosis present

## 2016-10-09 DIAGNOSIS — G894 Chronic pain syndrome: Secondary | ICD-10-CM | POA: Diagnosis present

## 2016-10-09 DIAGNOSIS — L97921 Non-pressure chronic ulcer of unspecified part of left lower leg limited to breakdown of skin: Secondary | ICD-10-CM | POA: Diagnosis present

## 2016-10-09 DIAGNOSIS — E8779 Other fluid overload: Secondary | ICD-10-CM

## 2016-10-09 DIAGNOSIS — I255 Ischemic cardiomyopathy: Secondary | ICD-10-CM | POA: Diagnosis present

## 2016-10-09 DIAGNOSIS — M6281 Muscle weakness (generalized): Secondary | ICD-10-CM

## 2016-10-09 DIAGNOSIS — Z888 Allergy status to other drugs, medicaments and biological substances status: Secondary | ICD-10-CM

## 2016-10-09 DIAGNOSIS — Z515 Encounter for palliative care: Secondary | ICD-10-CM | POA: Diagnosis not present

## 2016-10-09 DIAGNOSIS — N2581 Secondary hyperparathyroidism of renal origin: Secondary | ICD-10-CM

## 2016-10-09 DIAGNOSIS — Z95828 Presence of other vascular implants and grafts: Secondary | ICD-10-CM

## 2016-10-09 DIAGNOSIS — Z91041 Radiographic dye allergy status: Secondary | ICD-10-CM

## 2016-10-09 DIAGNOSIS — E213 Hyperparathyroidism, unspecified: Secondary | ICD-10-CM

## 2016-10-09 LAB — COMPREHENSIVE METABOLIC PANEL
ALBUMIN: 3 g/dL — AB (ref 3.5–5.0)
ALT: 9 U/L — AB (ref 14–54)
AST: 32 U/L (ref 15–41)
Alkaline Phosphatase: 414 U/L — ABNORMAL HIGH (ref 38–126)
Anion gap: 13 (ref 5–15)
BUN: 21 mg/dL — AB (ref 6–20)
CHLORIDE: 98 mmol/L — AB (ref 101–111)
CO2: 28 mmol/L (ref 22–32)
CREATININE: 3.7 mg/dL — AB (ref 0.44–1.00)
Calcium: 10 mg/dL (ref 8.9–10.3)
GFR calc Af Amer: 18 mL/min — ABNORMAL LOW (ref >60)
GFR, EST NON AFRICAN AMERICAN: 16 mL/min — AB (ref >60)
GLUCOSE: 92 mg/dL (ref 65–99)
POTASSIUM: 4.3 mmol/L (ref 3.5–5.1)
SODIUM: 139 mmol/L (ref 135–145)
Total Bilirubin: 1.3 mg/dL — ABNORMAL HIGH (ref 0.3–1.2)
Total Protein: 7.3 g/dL (ref 6.5–8.1)

## 2016-10-09 LAB — CBC WITH DIFFERENTIAL/PLATELET
BASOS PCT: 1 %
Basophils Absolute: 0.1 10*3/uL (ref 0.0–0.1)
EOS PCT: 3 %
Eosinophils Absolute: 0.2 10*3/uL (ref 0.0–0.7)
HEMATOCRIT: 27.9 % — AB (ref 36.0–46.0)
Hemoglobin: 8.4 g/dL — ABNORMAL LOW (ref 12.0–15.0)
LYMPHS ABS: 1.2 10*3/uL (ref 0.7–4.0)
Lymphocytes Relative: 19 %
MCH: 27.6 pg (ref 26.0–34.0)
MCHC: 30.1 g/dL (ref 30.0–36.0)
MCV: 91.8 fL (ref 78.0–100.0)
MONO ABS: 0.5 10*3/uL (ref 0.1–1.0)
MONOS PCT: 8 %
NEUTROS ABS: 4.4 10*3/uL (ref 1.7–7.7)
Neutrophils Relative %: 69 %
PLATELETS: 293 10*3/uL (ref 150–400)
RBC: 3.04 MIL/uL — AB (ref 3.87–5.11)
RDW: 22.7 % — AB (ref 11.5–15.5)
WBC: 6.4 10*3/uL (ref 4.0–10.5)

## 2016-10-09 LAB — TROPONIN I: Troponin I: 0.23 ng/mL (ref <0.03)

## 2016-10-09 MED ORDER — HEPARIN (PORCINE) IN NACL 100-0.45 UNIT/ML-% IJ SOLN
700.0000 [IU]/h | INTRAMUSCULAR | Status: DC
  Administered 2016-10-09: 700 [IU]/h via INTRAVENOUS
  Filled 2016-10-09: qty 250

## 2016-10-09 MED ORDER — LANTHANUM CARBONATE 500 MG PO CHEW
1000.0000 mg | CHEWABLE_TABLET | Freq: Three times a day (TID) | ORAL | Status: DC
Start: 2016-10-10 — End: 2016-10-12
  Administered 2016-10-10 – 2016-10-12 (×5): 1000 mg via ORAL
  Filled 2016-10-09 (×6): qty 2

## 2016-10-09 MED ORDER — ACETAMINOPHEN 325 MG PO TABS: 650.0000 mg | ORAL_TABLET | Freq: Four times a day (QID) | ORAL | Status: DC | PRN

## 2016-10-09 MED ORDER — SODIUM CHLORIDE 0.9% FLUSH: 3.0000 mL | INTRAVENOUS | Status: DC | PRN

## 2016-10-09 MED ORDER — FENTANYL 50 MCG/HR TD PT72
100.0000 ug | MEDICATED_PATCH | TRANSDERMAL | Status: DC
  Administered 2016-10-09 – 2016-10-12 (×2): 100 ug via TRANSDERMAL
  Filled 2016-10-09 (×2): qty 2

## 2016-10-09 MED ORDER — KETOROLAC TROMETHAMINE 15 MG/ML IJ SOLN
15.0000 mg | Freq: Four times a day (QID) | INTRAMUSCULAR | Status: DC | PRN
  Administered 2016-10-10: 15 mg via INTRAVENOUS
  Filled 2016-10-09: qty 1

## 2016-10-09 MED ORDER — ONDANSETRON HCL 4 MG PO TABS: 4.0000 mg | ORAL_TABLET | Freq: Four times a day (QID) | ORAL | Status: DC | PRN

## 2016-10-09 MED ORDER — DEXTROSE 5 % IV SOLN
2.0000 g | INTRAVENOUS | Status: DC
  Administered 2016-10-10: 2 g via INTRAVENOUS
  Filled 2016-10-09: qty 2

## 2016-10-09 MED ORDER — FAMOTIDINE 20 MG PO TABS: 20.0000 mg | ORAL_TABLET | Freq: Two times a day (BID) | ORAL | Status: DC | PRN

## 2016-10-09 MED ORDER — SODIUM CHLORIDE 0.9% FLUSH
3.0000 mL | Freq: Two times a day (BID) | INTRAVENOUS | Status: DC
  Administered 2016-10-09 – 2016-10-12 (×3): 3 mL via INTRAVENOUS

## 2016-10-09 MED ORDER — SODIUM CHLORIDE 0.9% FLUSH
3.0000 mL | Freq: Two times a day (BID) | INTRAVENOUS | Status: DC
  Administered 2016-10-09 – 2016-10-12 (×3): 3 mL via INTRAVENOUS

## 2016-10-09 MED ORDER — CALCITONIN (SALMON) 200 UNIT/ACT NA SOLN
1.0000 | Freq: Every day | NASAL | Status: DC
  Administered 2016-10-10 – 2016-10-12 (×3): 1 via NASAL
  Filled 2016-10-09 (×2): qty 3.7

## 2016-10-09 MED ORDER — LORAZEPAM 1 MG PO TABS
1.0000 mg | ORAL_TABLET | Freq: Every day | ORAL | Status: DC
  Administered 2016-10-09 – 2016-10-11 (×3): 1 mg via ORAL
  Filled 2016-10-09 (×3): qty 1

## 2016-10-09 MED ORDER — CINACALCET HCL 30 MG PO TABS
90.0000 mg | ORAL_TABLET | Freq: Every day | ORAL | Status: DC
  Administered 2016-10-11: 90 mg via ORAL
  Filled 2016-10-09: qty 3

## 2016-10-09 MED ORDER — ACETAMINOPHEN 650 MG RE SUPP: 650.0000 mg | Freq: Four times a day (QID) | RECTAL | Status: DC | PRN

## 2016-10-09 MED ORDER — DEXTROSE 5 % IV SOLN: 1.0000 g | Freq: Three times a day (TID) | INTRAVENOUS | Status: DC

## 2016-10-09 MED ORDER — SERTRALINE HCL 100 MG PO TABS
100.0000 mg | ORAL_TABLET | Freq: Every day | ORAL | Status: DC
  Administered 2016-10-10 – 2016-10-12 (×3): 100 mg via ORAL
  Filled 2016-10-09 (×3): qty 1

## 2016-10-09 MED ORDER — RENA-VITE PO TABS
1.0000 | ORAL_TABLET | Freq: Every day | ORAL | Status: DC
  Administered 2016-10-10 – 2016-10-11 (×2): 1 via ORAL
  Filled 2016-10-09 (×2): qty 1

## 2016-10-09 MED ORDER — GABAPENTIN 300 MG PO CAPS
300.0000 mg | ORAL_CAPSULE | Freq: Every day | ORAL | Status: DC
  Administered 2016-10-09: 300 mg via ORAL
  Filled 2016-10-09: qty 1

## 2016-10-09 MED ORDER — SODIUM CHLORIDE 0.9 % IV SOLN: 250.0000 mL | INTRAVENOUS | Status: DC | PRN

## 2016-10-09 MED ORDER — METHOCARBAMOL 500 MG PO TABS
500.0000 mg | ORAL_TABLET | Freq: Four times a day (QID) | ORAL | Status: DC | PRN
  Administered 2016-10-11 – 2016-10-12 (×4): 500 mg via ORAL
  Filled 2016-10-09 (×4): qty 1

## 2016-10-09 MED ORDER — SENNA 8.6 MG PO TABS
2.0000 | ORAL_TABLET | Freq: Every day | ORAL | Status: DC
  Administered 2016-10-10: 17.2 mg via ORAL
  Filled 2016-10-09 (×3): qty 2

## 2016-10-09 MED ORDER — ONDANSETRON HCL 4 MG/2ML IJ SOLN: 4.0000 mg | Freq: Four times a day (QID) | INTRAMUSCULAR | Status: DC | PRN

## 2016-10-09 MED ORDER — OXYCODONE HCL 5 MG PO TABS
30.0000 mg | ORAL_TABLET | ORAL | Status: DC | PRN
  Administered 2016-10-09 – 2016-10-12 (×11): 30 mg via ORAL
  Filled 2016-10-09 (×9): qty 6

## 2016-10-09 MED ORDER — ENOXAPARIN SODIUM 30 MG/0.3ML ~~LOC~~ SOLN
30.0000 mg | SUBCUTANEOUS | Status: DC
  Administered 2016-10-09: 30 mg via SUBCUTANEOUS
  Filled 2016-10-09: qty 0.3

## 2016-10-09 NOTE — Progress Notes (Signed)
CRITICAL LAB VALUE TROPONIN: 0.23 On call NP notified.  Veatrice Kells, RN

## 2016-10-09 NOTE — H&P (Addendum)
[name] Small KGM:[id number] DOB: [birth year] DOA: 10/09/2016     PCP: Jeanann Lewandowsky, MD   Outpatient Specialists: [facility]  Patient coming from:   home Lives  With family    Chief Complaint: Shortness of breath and hyperkalemia  HPI: [name] Small is a 25 y.o. female with medical history significant of ESRD secondary to polycystic kidney disease with noncompliance, on HD  MWF chronic systolic heart failure status post AICD placement last EF  06/2016 was 12%,  T7,T8,T12 vertebral compression fractures , AV graft placed on right lower extremity complicated by infection now status post removal, HTN, hyperparathyroidism, calciphylaxis, tobacco abuse, history of DVT and bilateral pulmonary embolism status post IVC filter not on anticoagulation.     Presented with presented to [facility] emergency department with shortness of breath starting today potassium was noted to be 6.5 she was treated to emergency an emerge department with Kayexalate.   Patient was recently diagnosed with calciphylaxis palliative care consult has been called she was started on fentanyl patches and increased oxycodone 30 mg 3 times a day she continues to have wounds and ulcers are very painful in her legs she is doing daily wound dressings at home. Reports that after she presented to her primary care provider he discontinued her fentanyl patch and recommended for her to go to pain care clinic. She ran out of oxycodone. she had not had any fevers or chills patient was reporting shortness of breath associated with chest pain which she describes feels that she is really short of breath has been ongoing for past 2 days given slightly elevated troponin she was admitted to [facility] she was started on heparin drip but refused it. Patient this point denies states that she just misunderstood. Her shortness of breath has improved chest x-ray was worrisome for left lower lobe consolidation blood cultures were  obtained and patient started on vancomycin and Zosyn for possible sepsis. She was dialyzed today 18th of December.  MRSA PCR was negative patient requests to be transferred to [facility] for continuity of  care  Regarding pertinent Chronic problems: Patient receives her HD on the left lower extremity dialysis catheter She have had recent AV graft placement in the right lower extremity since then was admitted for cellulitis postoperatively. Patient had failed kidney transplant 2012, recurrent admissions for volume overload secondary to noncompliance with hemodialysis. Patient was admitted recently from November 22 to December 6 was diagnosed with infected AV graft and calciphylaxis. Urine her last admission she had palliative care consult patient was changed to DO NOT RESUSCITATE status she wishes to continue hemodialysis but otherwise concentrated on comfort measures. Guiding her history of nonischemic cardiomyopathy her volume has been managed with hemodialysis    IN ER:  Temp (24hrs), Avg:99.2 F (37.3 C), Min:99.2 F (37.3 C), Max:99.2 F (37.3 C)  hemoglobin 10.0 HR 99 RR 20 BP 122/79 satting 94% on room air  WBC 9.2 troponin noted to be elevated 0.363 potassium 6.7 Chest x-ray showing mild CHF with left pleural effusion and possible consolidation      Hospitalist was called for admission for HYPERKALEMIA  Review of Systems:    Pertinent positives include:  chest pain, Orthopnea, shortness of breath at rest. dyspnea on exertion, back pain. Leg pain Constitutional:  No weight loss, night sweats, Fevers, chills, fatigue, weight loss  HEENT:  No headaches, Difficulty swallowing,Tooth/dental problems,Sore throat,  No sneezing, itching, ear ache, nasal congestion, post nasal drip,  Cardio-vascular:  No  PND,  anasarca, dizziness, palpitations.no Bilateral lower extremity swelling  GI:  No heartburn, indigestion, abdominal pain, nausea, vomiting, diarrhea, change in bowel habits, loss of  appetite, melena, blood in stool, hematemesis Resp:  no  No No excess mucus, no productive cough, No non-productive cough, No coughing up of blood.No change in color of mucus.No wheezing. Skin:  no rash or lesions. No jaundice GU:  no dysuria, change in color of urine, no urgency or frequency. No straining to urinate.  No flank pain.  Musculoskeletal:  No joint pain or no joint swelling. No decreased range of motion. No  Psych:  No change in mood or affect. No depression or anxiety. No memory loss.  Neuro: no localizing neurological complaints, no tingling, no weakness, no double vision, no gait abnormality, no slurred speech, no confusion  As per HPI otherwise 10 point review of systems negative.   Past Medical History: Past Medical History:  Diagnosis Date  . AICD (automatic cardioverter/defibrillator) present 12/16/14   [employer]  . Anemia   . Anxiety   . Cardiomyopathy   . Cellulitis and abscess of face 03/22/2013  . CHF (congestive heart failure) (HCC)   . Chronic anticoagulation   . Chronic pain   . Depression   . DVT (deep venous thrombosis) (HCC) ~ 2014   BLE  . Dysrhythmia    at times per pt.  . 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  Marland Kitchen. ESRD (end stage renal disease) on dialysis [facility](HCC)    "MWF; [facility] " (09/2016)  . Fracture, thoracic vertebra (HCC) 07/2016   "T7-T12"  . GERD (gastroesophageal reflux disease)   . H/O transfusion of packed red blood cells   . Heart murmur   . Hypertension   . Narcotic abuse, continuous   . Osteoporosis   . Pelvic fracture (HCC) 06/2015   "on the right"  . Pulmonary emboli (HCC) 01/2012   Bilateral, moderate clot burden, areas of pulmonary infarction and central necrosis  . Sickle cell anemia (HCC)    Past Surgical History:  Procedure Laterality Date  . AV FISTULA PLACEMENT  Bilateral    "right side stopped working & never had it revised" (04/11/2016)  . AV FISTULA PLACEMENT Right 08/01/2016   Procedure: INSERTION OF ARTERIOVENOUS (AV) GORE-TEX GRAFT RIGHT ARM;  Surgeon: Chuck Hinthristopher S Dickson, MD;  [facility];  Service: Vascular;  Laterality: Right;  . AV FISTULA PLACEMENT Right 08/31/2016   Procedure: INSERTION OF ARTERIOVENOUS (AV) GORE-TEX GRAFT- RIGHT FEMORAL LOOP GRAFT;  Surgeon: Maeola HarmanBrandon Christopher Cain, MD;  [facility];  Service: Vascular;  Laterality: Right;  . AVGG REMOVAL Right 08/03/2016   Procedure: REMOVAL OF ARTERIOVENOUS GORETEX GRAFT (AVGG);  Surgeon: Maeola HarmanBrandon Christopher Cain, MD;  [facility];  Service: Vascular;  Laterality: Right;  . CARDIAC CATHETERIZATION    . IMPLANTABLE CARDIOVERTER DEFIBRILLATOR IMPLANT Right 12/2014  . INCISION AND DRAINAGE ABSCESS Right 03/21/2013   Procedure: INCISION AND DRAINAGE RIGHT CHEEK ABSCESS REMOVAL OF FOREIGN BODY;  Surgeon: Serena ColonelJefry Rosen, MD;  [facility];  Service: ENT;  Laterality: Right;  . INSERTION OF DIALYSIS CATHETER Right Mar 09, 2016   th[facility] Wyn [place]  . INSERTION OF DIALYSIS CATHETER Left 09/02/2016   Procedure: Insertion of Dialysis Catheter Left Femoral Vein;  Surgeon: Maeola HarmanBrandon Christopher Cain, MD;  [facility];  Service: Vascular;  Laterality: Left;  . KIDNEY TRANSPLANT  2008  failed  . NEPHRECTOMY Right     kidney placed in 2008, and body rejected in 2012   . PERIPHERAL VASCULAR CATHETERIZATION Left 03/09/2016   Procedure: A/V Shuntogram/Fistulagram;  Surgeon: Annice Needy, MD;  Location: [facility] INVASIVE CV LAB;  Service: Cardiovascular;  Laterality: Left;  . PERIPHERAL VASCULAR CATHETERIZATION N/A 05/09/2016   Procedure: Dialysis/Perma Catheter Removal;  Surgeon: Renford Dills, MD;  Location: [facility] INVASIVE CV LAB;  Service: Cardiovascular;  Laterality: N/A;  . PERIPHERAL VASCULAR CATHETERIZATION N/A 07/12/2016   Procedure: Dialysis/Perma Catheter Insertion;  Surgeon: Annice Needy, MD;  Location: [facility] INVASIVE CV LAB;  Service: Cardiovascular;  Laterality: N/A;  . PERIPHERAL VASCULAR CATHETERIZATION Right 07/31/2016   Procedure: Upper Extremity Venography;  Surgeon: Maeola Harman, MD;  Location: [facility] INVASIVE CV LAB;  Service: Cardiovascular;  Laterality: Right;  . REMOVAL OF GRAFT Right 09/15/2016   Procedure: REMOVAL OF RIGHT THIGH GRAFT;  Surgeon: Sherren Kerns, MD;  Location: [facility] OR;  Service: Vascular;  Laterality: Right;  . REVISON OF ARTERIOVENOUS FISTULA Left 03/22/2016   Procedure: REVISON OF ARTERIOVENOUS FISTULA ( ARTEGRAFT );  Surgeon: Annice Needy, MD;  Location: [facility] ORS;  Service: Vascular;  Laterality: Left;  . TONSILLECTOMY AND ADENOIDECTOMY  ~ 2000     Social History:  Ambulatory   wheelchair bound,     reports that she quit smoking about 10 months ago. Her smoking use included Cigarettes. She smoked 0.00 packs per day for 1.00 year. She has never used smokeless tobacco. She reports that she does not drink alcohol or use drugs.  Allergies:   Allergies  Allergen Reactions  . Aspirin Other (See Comments)    Interacts with Coreg  . Tramadol Anaphylaxis  . Vicodin [Hydrocodone-Acetaminophen] Hives  . Buprenorphine Hcl Itching    Ok with oxycodone  . Iohexol Itching  . Morphine And Related Itching and Other (See Comments)    Ok with oxycodone        Family History:   Family History  Problem Relation Age of Onset  . Polycystic kidney disease Father   . Hypertension Father     Medications: Prior to Admission medications   Medication Sig Start Date End Date Taking? Authorizing Provider  calcitonin, salmon, (MIACALCIN) 200 UNIT/ACT nasal spray Place 1 spray into alternate nostrils daily. 09/01/16   Mcarthur Rossetti Angiulli, PA-C  cinacalcet (SENSIPAR) 30 MG tablet Take 3 tablets (90 mg total) by mouth daily with supper. 09/01/16   Mcarthur Rossetti Angiulli, PA-C  famotidine (PEPCID) 20 MG tablet Take 1 tablet (20 mg total) by mouth 2  (two) times daily as needed for heartburn or indigestion. 09/06/16   Ankit Karis Juba, MD  fentaNYL (DURAGESIC - DOSED MCG/HR) 100 MCG/HR Place 1 patch (100 mcg total) onto the skin every 3 (three) days. 09/29/16   Zannie Cove, MD  gabapentin (NEURONTIN) 100 MG capsule Take 3 capsules (300 mg total) by mouth at bedtime. 09/27/16   Zannie Cove, MD  lanthanum (FOSRENOL) 1000 MG chewable tablet Chew 1 tablet (1,000 mg total) by mouth 3 (three) times daily with meals. 09/01/16   Mcarthur Rossetti Angiulli, PA-C  lidocaine (LIDODERM) 5 % Place 1 patch onto the skin daily. Remove & Discard patch within 12 hours or as directed by MD 09/01/16   Mcarthur Rossetti Angiulli, PA-C  LORazepam (ATIVAN) 1 MG tablet Take 1 tablet (1 mg total) by mouth at bedtime. 09/05/16   Quentin Angst, MD  methocarbamol (ROBAXIN) 500 MG tablet Take 1 tablet (  500 mg total) by mouth every 6 (six) hours as needed for muscle spasms. 09/01/16   Mcarthur Rossetti Angiulli, PA-C  multivitamin (RENA-VIT) TABS tablet Take 1 tablet by mouth daily. 09/01/16   Mcarthur Rossetti Angiulli, PA-C  Oxycodone HCl 10 MG TABS Take 3 tablets (30 mg total) by mouth every 4 (four) hours as needed. 10/03/16   Quentin Angst, MD  senna (SENOKOT) 8.6 MG TABS tablet Take 2 tablets (17.2 mg total) by mouth daily. 09/28/16   Zannie Cove, MD  sertraline (ZOLOFT) 100 MG tablet Take 1 tablet (100 mg total) by mouth daily. 09/01/16   Charlton Amor, PA-C    Physical Exam: Patient Vitals for the past 24 hrs:  BP Temp Temp src Pulse Resp SpO2 Height Weight  10/09/16 1946 99/60 99.2 F (37.3 C) Oral 92 19 98 % 5\' 3"  (1.6 m) 53.6 kg (118 lb 3.2 oz)    1. General:  in No Acute distress 2. Psychological: Alert and  Oriented 3. Head/ENT:   Moist  Mucous Membranes                          Head Non traumatic, neck supple                          Normal Dentition 4. SKIN: normal   Skin turgor,  Skin clean DryUlcers noted on the lower extremities bilaterally      5. Heart:  Regular rate and rhythm no  Murmur, Rub or gallop 6. Lungs:  no wheezes or crackles   7. Abdomen: Soft, non-tender, Non distended 8. Lower extremities: no clubbing, cyanosis, or edema 9. Neurologically Grossly intact, moving all 4 extremities equally  10. MSK: Normal range of motion   body mass index is 20.94 kg/m.  Labs on Admission:   Labs on Admission: I have personally reviewed following labs and imaging studies  CBC:  Recent Labs Lab 10/09/16 2018  WBC 6.4  NEUTROABS 4.4  HGB 8.4*  HCT 27.9*  MCV 91.8  PLT 293   Basic Metabolic Panel:  Recent Labs Lab 10/09/16 2018  NA 139  K 4.3  CL 98*  CO2 28  GLUCOSE 92  BUN 21*  CREATININE 3.70*  CALCIUM 10.0   GFR: Estimated Creatinine Clearance: 13.2 mL/min (by C-G formula based on SCr of 5.37 mg/dL (H)). Liver Function Tests:  Recent Labs Lab 10/09/16 2018  AST 32  ALT 9*  ALKPHOS 414*  BILITOT 1.3*  PROT 7.3  ALBUMIN 3.0*   No results for input(s): LIPASE, AMYLASE in the last 168 hours. No results for input(s): AMMONIA in the last 168 hours. Coagulation Profile: No results for input(s): INR, PROTIME in the last 168 hours. Cardiac Enzymes: No results for input(s): CKTOTAL, CKMB, CKMBINDEX, TROPONINI in the last 168 hours. BNP (last 3 results) No results for input(s): PROBNP in the last 8760 hours. HbA1C: No results for input(s): HGBA1C in the last 72 hours. CBG: No results for input(s): GLUCAP in the last 168 hours. Lipid Profile: No results for input(s): CHOL, HDL, LDLCALC, TRIG, CHOLHDL, LDLDIRECT in the last 72 hours. Thyroid Function Tests: No results for input(s): TSH, T4TOTAL, FREET4, T3FREE, THYROIDAB in the last 72 hours. Anemia Panel: No results for input(s): VITAMINB12, FOLATE, FERRITIN, TIBC, IRON, RETICCTPCT in the last 72 hours.  Sepsis Labs: @LABRCNTIP (procalcitonin:4,lacticidven:4) )No results found for this or any previous visit (from the past 240 hour(s)).  UA not  ordered  Lab Results  Component Value Date   HGBA1C 5.5 06/17/2016    Estimated Creatinine Clearance: 13.2 mL/min (by C-G formula based on SCr of 5.37 mg/dL (H)).  BNP (last 3 results) No results for input(s): PROBNP in the last 8760 hours.   ECG REPORT  Independently reviewed Rate:89  Rhythm: Sinus rhythm and first-degree AV block ST&T Change: T wave inversions in lead V5V 6 unchanged from November QTC 496  Filed Weights   10/09/16 1946  Weight: 53.6 kg (118 lb 3.2 oz)     Cultures:    Component Value Date/Time   SDES WOUND RIGHT THIGH 09/15/2016 1734   SPECREQUEST PATIENT ON FOLLOWING ZOSYN NO ANAEROBIC SWAB SENT 09/15/2016 1734   CULT  09/15/2016 1734    RARE STAPHYLOCOCCUS SPECIES (COAGULASE NEGATIVE) CALL MICROBIOLOGY LAB IF SENSITIVITIES ARE REQUIRED. NO ANAEROBES ISOLATED    REPTSTATUS 09/20/2016 FINAL 09/15/2016 1734     Radiological Exams on Admission: No results found.  Chart has been reviewed    Assessment/Plan   25 y.o. female with medical history significant of ESRD secondary to polycystic kidney disease with noncompliance, on HD MWF  hronic systolic heart failure status post AICD placement last EF  06/2016 was 12%,  T7,T8,T12 vertebral compression fractures , AV graft placed on right lower extremity complicated by infection now status post removal, HTN, hyperparathyroidism, calciphylaxis, tobacco abuse, history of DVT and bilateral pulmonary embolism status post IVC filter not on anticoagulation Admitted for fluid overload now status post hemodialysis possible healthcare associated pneumonia and acute respiratory failure associated chest pain elevated troponin being evaluated for possible PE reportedly has IV contrast allergies.   Present on Admission: . Hyperkalemia currently resolved after hemodialysis suspect possible noncompliance . Volume overload - avoid fluid overload may need more frequent hemodialysis to maintain fluid status . Ulcer of left  lower extremity, limited to breakdown of skin (HCC) - secondary to calciphylaxis order one care consult . Tobacco abuse recommended cessation . Thoracic compression fracture - T7, T8, T12 pain management continue home medications . Pain in the chest - given history of PE currently not on anticoagulation will obtain VQ scan patient has IV contrast allergies. Meanwhile given elevated troponin initiate heparin  . Essential hypertension stable continue home medications . Chronic systolic heart failure (HCC) - avoid fluid overload hemodialysis dependent . Chronic pain syndrome continue home medication patient will need set up with pain clinic as an outpatient . 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 Calciphylaxis stable continue pain management and wound care . Anemia of chronic renal failure, stage 5 (HCC) stable continue home medications Elevated troponin most likely in the setting of end-stage renal disease and chronic severe systolic heart failure but also given ongoing chest pain we will evaluate for possibility of PE HCAP- possible Pneurmonia on CXR from [facility] continue antibiotics for now Other plan as per orders.  DVT prophylaxis:  heparin   Code Status:    DNR/DNI as per patient  she states she would like to be comfortable but also states I'm not ready to give up old like to continue my hemodialysis she's not interested in hospice at this point  Family Communication:   Family   at  Bedside    Disposition Plan:     likely will  need placement for rehabilitation                                              Would benefit from PT/OT eval prior to DC                                               Consults called: nephrology  left a message  Admission status:   inpatient        Level of care     tele            I have spent a total of 56 min on this admission   [name] Small 10/09/2016, 9:57 PM    [facility]  Pager [phone]   after 2 AM please page floor coverage PA If 7AM-7PM, please contact the day team taking care of the patient  [email or web address]  Password [email or web address]

## 2016-10-09 NOTE — Progress Notes (Addendum)
Patient rated pain 9/10. PRN Oxy IR 30 mg given. Patient complains that pain is still present and would like Dilaudid. On call NP, Craige Cotta, notified. RN awaiting orders. RN will continue to monitor patient.   Patient is aware that she had Toradol 15mg  available and refuses to take it because she stated "i've never taken it before".  Veatrice Kells, RN

## 2016-10-09 NOTE — Progress Notes (Signed)
ANTICOAGULATION AND ANTIBIOTIC CONSULT NOTE - Initial Consult  Pharmacy Consult for Heparin, Vancomycin, and Cefepime Indication: chest pain/ACS; HCAP, possible wound infection  Allergies  Allergen Reactions  . Aspirin Other (See Comments)    Interacts with Coreg  . Tramadol Anaphylaxis  . Vicodin [Hydrocodone-Acetaminophen] Hives  . Buprenorphine Hcl Itching    Ok with oxycodone  . Iohexol Itching  . Morphine And Related Itching and Other (See Comments)    Ok with oxycodone     Patient Measurements: Height: 5\' 3"  (160 cm) Weight: 118 lb 3.2 oz (53.6 kg) IBW/kg (Calculated) : 52.4 Heparin Dosing Weight: 53.6 kg  Vital Signs: Temp: 99.2 F (37.3 C) (12/18 1946) Temp Source: Oral (12/18 1946) BP: 99/60 (12/18 1946) Pulse Rate: 92 (12/18 1946)  Labs:  Recent Labs  10/09/16 2018  HGB 8.4*  HCT 27.9*  PLT 293  CREATININE 3.70*  TROPONINI 0.23*   Antimicrobials this admission:  Vanc + Zosyn at [facility] (started 12/17?) Vanco 12/18 >> Cefepime 12/18 >>  Dose adjustments this admission:  12/18 VR 36.7 ([facility])  Microbiology results:  12/18 MRSA PCR neg (at [facility]) 12/18 Flu neg (at [facility])   Estimated Creatinine Clearance: 19.2 mL/min (by C-G formula based on SCr of 3.7 mg/dL (H)). ESRD with HD - MWF - [facility] (~4 hours)  Medical History: Past Medical History:  Diagnosis Date  . AICD (automatic cardioverter/defibrillator) present 12/16/14   [employer]  . Anemia   . Anxiety   . Cardiomyopathy   . Cellulitis and abscess of face 03/22/2013  . CHF (congestive heart failure) (HCC)   . Chronic anticoagulation   . Chronic pain   . Depression   . DVT (deep venous thrombosis) (HCC) ~ 2014   BLE  . Dysrhythmia    at times per pt.  . 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  Marland Kitchen. ESRD (end stage renal disease) on  dialysis [facility](HCC)    "MWF; [facility] " (09/2016)  . Fracture, thoracic vertebra (HCC) 07/2016   "T7-T12"  . GERD (gastroesophageal reflux disease)   . H/O transfusion of packed red blood cells   . Heart murmur   . Hypertension   . Narcotic abuse, continuous   . Osteoporosis   . Pelvic fracture (HCC) 06/2015   "on the right"  . Pulmonary emboli (HCC) 01/2012   Bilateral, moderate clot burden, areas of pulmonary infarction and central necrosis  . Sickle cell anemia (HCC)     Medications:  Prescriptions Prior to Admission  Medication Sig Dispense Refill Last Dose  . calcitonin, salmon, (MIACALCIN) 200 UNIT/ACT nasal spray Place 1 spray into alternate nostrils daily. 3.7 mL 0 10/09/2016 at Unknown time  . calcitRIOL (ROCALTROL) 0.5 MCG capsule Take 0.5 mcg by mouth every Monday, Wednesday, and Friday.   10/06/2016  . carvedilol (COREG) 6.25 MG tablet Take 6.25 mg by mouth 2 (two) times daily.     . cinacalcet (SENSIPAR) 30 MG tablet Take 3 tablets (90 mg total) by mouth daily with supper. (Patient taking differently: Take 30 mg by mouth daily with supper. ) 180 tablet 0 10/09/2016 at Unknown time  . famotidine (PEPCID) 20 MG tablet Take 1 tablet (20 mg total) by mouth 2 (two) times daily as needed for heartburn or indigestion. 60 tablet 0 10/09/2016 at Unknown time  . fentaNYL (DURAGESIC - DOSED MCG/HR) 100  MCG/HR Place 1 patch (100 mcg total) onto the skin every 3 (three) days. 5 patch 0 Past Week at Unknown time  . gabapentin (NEURONTIN) 100 MG capsule Take 3 capsules (300 mg total) by mouth at bedtime. (Patient taking differently: Take 100-200 mg by mouth 2 (two) times daily. 100mg  at breakfast and 200mg  at lunch) 60 capsule 2 10/09/2016 at Unknown time  . lanthanum (FOSRENOL) 1000 MG chewable tablet Chew 1 tablet (1,000 mg total) by mouth 3 (three) times daily with meals. 90 tablet 0 10/09/2016 at Unknown time  . LORazepam (ATIVAN) 1 MG tablet Take 1 tablet (1 mg total) by  mouth at bedtime. (Patient taking differently: Take 1 mg by mouth daily. ) 30 tablet 1 10/09/2016 at Unknown time  . methocarbamol (ROBAXIN) 500 MG tablet Take 1 tablet (500 mg total) by mouth every 6 (six) hours as needed for muscle spasms. 90 tablet 0 10/09/2016 at Unknown time  . multivitamin (RENA-VIT) TABS tablet Take 1 tablet by mouth daily. 90 tablet 3 10/09/2016 at Unknown time  . Oxycodone HCl 10 MG TABS Take 3 tablets (30 mg total) by mouth every 4 (four) hours as needed. 60 tablet 0 10/09/2016 at Unknown time  . sertraline (ZOLOFT) 100 MG tablet Take 1 tablet (100 mg total) by mouth daily. 30 tablet 3 10/09/2016 at Unknown time    Assessment: 25 yo F presented to [facility] ED with SOB and hyperkalemia.  She was there from 12/17 > 12/18 with SOB, CP (elevated troponin), back pain, LLL pneumonia, and calciphylaxis.  Pt has been seen at [facility] recently and requested transfer for continuity of care.   [facility] MDs ordered heparin for chest pain, mildly elevated troponin, which she reportedly refused.  Rec'd Vancomycin and Zosyn prior to dialysis for HCAP.  LLL PNA documented on CXR.  VQ scan negative for PE.    12/18 at noon Vancomycin random level was 36.7.  Pt did have ~4h of HD at [facility] prior to transfer to [facility].  No Vancomycin given post-HD.  Estimate level ~20 now.  Will order random with AM labs and redose if needed.   Goal of Therapy:  Heparin level 0.3-0.7 units/ml Monitor platelets by anticoagulation protocol: Yes  Pre-HD Vancomycin level 15-25 mcg/ml   Plan:  Discontinue Lovenox - given 12/18 at 2100 Heparin at 700 units/hr (of note pt refused this at [facility]) Heparin level with AM labs Heparin level and CBC daily while on heparin Vancomycin random level with AM labs - redose prn Cefepime 2gm IV qHD - first dose 12/18 Abx LOT = 8 days per consult order Follow-up cx data and clinical progress  [employer], Pharm.D., BCPS Clinical Pharmacist Pager  [phone] 10/09/2016 10:40 PM

## 2016-10-10 ENCOUNTER — Inpatient Hospital Stay (HOSPITAL_COMMUNITY): Payer: Medicare Other

## 2016-10-10 ENCOUNTER — Encounter: Payer: Medicare Other | Admitting: Cardiology

## 2016-10-10 DIAGNOSIS — I428 Other cardiomyopathies: Secondary | ICD-10-CM

## 2016-10-10 DIAGNOSIS — R079 Chest pain, unspecified: Secondary | ICD-10-CM

## 2016-10-10 DIAGNOSIS — G894 Chronic pain syndrome: Secondary | ICD-10-CM

## 2016-10-10 DIAGNOSIS — I5022 Chronic systolic (congestive) heart failure: Secondary | ICD-10-CM

## 2016-10-10 DIAGNOSIS — R0602 Shortness of breath: Secondary | ICD-10-CM

## 2016-10-10 DIAGNOSIS — Z515 Encounter for palliative care: Secondary | ICD-10-CM

## 2016-10-10 DIAGNOSIS — I209 Angina pectoris, unspecified: Secondary | ICD-10-CM

## 2016-10-10 LAB — CBC
HEMATOCRIT: 29.6 % — AB (ref 36.0–46.0)
HEMOGLOBIN: 8.6 g/dL — AB (ref 12.0–15.0)
MCH: 26.6 pg (ref 26.0–34.0)
MCHC: 29.1 g/dL — AB (ref 30.0–36.0)
MCV: 91.6 fL (ref 78.0–100.0)
Platelets: 335 10*3/uL (ref 150–400)
RBC: 3.23 MIL/uL — AB (ref 3.87–5.11)
RDW: 22.9 % — ABNORMAL HIGH (ref 11.5–15.5)
WBC: 7.6 10*3/uL (ref 4.0–10.5)

## 2016-10-10 LAB — ECHOCARDIOGRAM COMPLETE
Area-P 1/2: 5.37 cm2
CHL CUP DOP CALC LVOT VTI: 11 cm
CHL CUP MV DEC (S): 141
CHL CUP RV SYS PRESS: 20 mmHg
CHL CUP TV REG PEAK VELOCITY: 208 cm/s
E/e' ratio: 18.19
EWDT: 141 ms
FS: 6 % — AB (ref 28–44)
HEIGHTINCHES: 63 in
IV/PV OW: 1.08
LA diam end sys: 35 mm
LA diam index: 2.26 cm/m2
LA vol A4C: 37.5 ml
LA vol: 41.2 mL
LASIZE: 35 mm
LAVOLIN: 26.6 mL/m2
LDCA: 4.52 cm2
LV E/e' medial: 18.19
LV E/e'average: 18.19
LV TDI E'LATERAL: 3.92
LV TDI E'MEDIAL: 6.74
LVELAT: 3.92 cm/s
LVOTD: 24 mm
LVOTPV: 64.8 cm/s
LVOTSV: 50 mL
Lateral S' vel: 3.81 cm/s
MV Peak grad: 2 mmHg
MV pk E vel: 71.3 m/s
P 1/2 time: 41 ms
PW: 11.9 mm — AB (ref 0.6–1.1)
TAPSE: 5.83 mm
TR max vel: 208 cm/s
WEIGHTICAEL: 1891.2 [oz_av]

## 2016-10-10 LAB — COMPREHENSIVE METABOLIC PANEL
ALBUMIN: 3 g/dL — AB (ref 3.5–5.0)
ALK PHOS: 446 U/L — AB (ref 38–126)
ALT: 10 U/L — ABNORMAL LOW (ref 14–54)
ANION GAP: 12 (ref 5–15)
AST: 33 U/L (ref 15–41)
BUN: 25 mg/dL — ABNORMAL HIGH (ref 6–20)
CALCIUM: 9.2 mg/dL (ref 8.9–10.3)
CHLORIDE: 96 mmol/L — AB (ref 101–111)
CO2: 32 mmol/L (ref 22–32)
Creatinine, Ser: 4.12 mg/dL — ABNORMAL HIGH (ref 0.44–1.00)
GFR calc non Af Amer: 14 mL/min — ABNORMAL LOW (ref >60)
GFR, EST AFRICAN AMERICAN: 16 mL/min — AB (ref >60)
GLUCOSE: 101 mg/dL — AB (ref 65–99)
POTASSIUM: 4.1 mmol/L (ref 3.5–5.1)
SODIUM: 140 mmol/L (ref 135–145)
Total Bilirubin: 1.1 mg/dL (ref 0.3–1.2)
Total Protein: 7.4 g/dL (ref 6.5–8.1)

## 2016-10-10 LAB — MAGNESIUM: MAGNESIUM: 2.1 mg/dL (ref 1.7–2.4)

## 2016-10-10 LAB — MRSA PCR SCREENING: MRSA by PCR: NEGATIVE

## 2016-10-10 LAB — TROPONIN I
Troponin I: 0.21 ng/mL (ref <0.03)
Troponin I: 0.24 ng/mL (ref <0.03)

## 2016-10-10 LAB — HEPARIN LEVEL (UNFRACTIONATED)
HEPARIN UNFRACTIONATED: 0.33 [IU]/mL (ref 0.30–0.70)
Heparin Unfractionated: 0.31 IU/mL (ref 0.30–0.70)

## 2016-10-10 LAB — HIV ANTIBODY (ROUTINE TESTING W REFLEX): HIV SCREEN 4TH GENERATION: NONREACTIVE

## 2016-10-10 LAB — TSH: TSH: 3.424 u[IU]/mL (ref 0.350–4.500)

## 2016-10-10 LAB — VANCOMYCIN, RANDOM: VANCOMYCIN RM: 20

## 2016-10-10 LAB — PHOSPHORUS: Phosphorus: 7.2 mg/dL — ABNORMAL HIGH (ref 2.5–4.6)

## 2016-10-10 MED ORDER — ORAL CARE MOUTH RINSE
15.0000 mL | Freq: Two times a day (BID) | OROMUCOSAL | Status: DC
  Administered 2016-10-10 – 2016-10-12 (×3): 15 mL via OROMUCOSAL

## 2016-10-10 MED ORDER — NEPRO/CARBSTEADY PO LIQD
237.0000 mL | Freq: Two times a day (BID) | ORAL | Status: DC
  Administered 2016-10-11 – 2016-10-12 (×2): 237 mL via ORAL

## 2016-10-10 MED ORDER — HYDROMORPHONE HCL 1 MG/ML IJ SOLN
0.5000 mg | Freq: Three times a day (TID) | INTRAMUSCULAR | Status: AC | PRN
  Administered 2016-10-10 – 2016-10-11 (×4): 0.5 mg via INTRAVENOUS
  Filled 2016-10-10 (×4): qty 1

## 2016-10-10 MED ORDER — OXYCODONE HCL 5 MG PO TABS
ORAL_TABLET | ORAL | Status: AC
  Administered 2016-10-10: 30 mg via ORAL
  Filled 2016-10-10: qty 6

## 2016-10-10 MED ORDER — HYDROMORPHONE HCL 1 MG/ML IJ SOLN
1.0000 mg | INTRAMUSCULAR | Status: AC | PRN
  Administered 2016-10-10 (×2): 1 mg via INTRAVENOUS
  Filled 2016-10-10 (×2): qty 1

## 2016-10-10 MED ORDER — DARBEPOETIN ALFA 100 MCG/0.5ML IJ SOSY
100.0000 ug | PREFILLED_SYRINGE | INTRAMUSCULAR | Status: DC
  Administered 2016-10-11: 100 ug via INTRAVENOUS
  Filled 2016-10-10: qty 0.5

## 2016-10-10 MED ORDER — LIDOCAINE HCL 2 % EX GEL
1.0000 "application " | Freq: Once | CUTANEOUS | Status: AC
  Administered 2016-10-10: 1 via TOPICAL
  Filled 2016-10-10: qty 5

## 2016-10-10 MED ORDER — DIPHENHYDRAMINE HCL 50 MG/ML IJ SOLN
25.0000 mg | Freq: Once | INTRAMUSCULAR | Status: AC
  Administered 2016-10-10: 25 mg via INTRAVENOUS

## 2016-10-10 MED ORDER — DIPHENHYDRAMINE HCL 50 MG/ML IJ SOLN
INTRAMUSCULAR | Status: AC
  Administered 2016-10-10: 25 mg via INTRAVENOUS
  Filled 2016-10-10: qty 1

## 2016-10-10 MED ORDER — GABAPENTIN 300 MG PO CAPS
300.0000 mg | ORAL_CAPSULE | Freq: Two times a day (BID) | ORAL | Status: DC
  Administered 2016-10-10 – 2016-10-12 (×4): 300 mg via ORAL
  Filled 2016-10-10 (×4): qty 1

## 2016-10-10 MED ORDER — SODIUM CHLORIDE 0.9 % IV SOLN
500.0000 mg | INTRAVENOUS | Status: DC
Start: 2016-10-11 — End: 2016-10-11
  Filled 2016-10-10: qty 500

## 2016-10-10 MED ORDER — SODIUM THIOSULFATE 25 % IV SOLN
25.0000 g | Freq: Once | INTRAVENOUS | Status: AC
  Administered 2016-10-10: 25 g via INTRAVENOUS
  Filled 2016-10-10 (×2): qty 100

## 2016-10-10 MED ORDER — HYDROMORPHONE HCL 2 MG PO TABS: 4.0000 mg | ORAL_TABLET | ORAL | Status: DC | PRN

## 2016-10-10 MED ORDER — SODIUM THIOSULFATE 25 % IV SOLN
25.0000 g | Freq: Once | INTRAVENOUS | Status: AC
  Administered 2016-10-11: 25 g via INTRAVENOUS
  Filled 2016-10-10 (×2): qty 100

## 2016-10-10 MED ORDER — COLLAGENASE 250 UNIT/GM EX OINT
TOPICAL_OINTMENT | Freq: Every day | CUTANEOUS | Status: DC
  Administered 2016-10-10 – 2016-10-12 (×3): via TOPICAL
  Filled 2016-10-10: qty 30

## 2016-10-10 MED ORDER — LIDOCAINE VISCOUS 2 % MT SOLN
15.0000 mL | OROMUCOSAL | Status: DC | PRN
  Filled 2016-10-10: qty 15

## 2016-10-10 MED ORDER — VANCOMYCIN HCL 500 MG IV SOLR
500.0000 mg | INTRAVENOUS | Status: DC
  Filled 2016-10-10: qty 500

## 2016-10-10 MED ORDER — DEXTROSE 5 % IV SOLN
2.0000 g | INTRAVENOUS | Status: DC
  Administered 2016-10-11: 2 g via INTRAVENOUS
  Filled 2016-10-10: qty 2

## 2016-10-10 NOTE — Progress Notes (Signed)
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 Also patient refused to turn and move legs to assess back of them.   IV: L EJ and L FA, saline locked Pain: 9/10, RN awaiting admitting to see patient and place orders Tubes: N/A Safety Measures: Safety Fall Prevention Plan was discussed with patient Admission: Completed [address] [place] Orientation: Patient has been orientated to the room, unit and the staff. Family: significant other at bedside.  Orders have been reviewed and implemented. Will continue to monitor the patient. Call light has been placed within reach and bed alarm has been activated.   Rivka Barbara BSN, RN  Phone Number: [phone]

## 2016-10-10 NOTE — Progress Notes (Signed)
PT Cancellation Note  Patient Details Name: Beatriz Molloy MRN: [id number] DOB: [birth year]   Cancelled Treatment:    Reason Eval/Treat Not Completed: Pain limiting ability to participate; patient in pain and waiting on medication RN to contact MD.  Will attempt later as time permits.   Elray Mcgregor 10/10/2016, 2:46 PM  Sheran Lawless, [place] [phone] 10/10/2016

## 2016-10-10 NOTE — Consult Note (Addendum)
WOC Nurse wound consult note Pt is familiar to [facility] team from recent admission on 11/27.  She was previously followed by the vascular team for her right thigh wounds.  She has multiple areas of dry eschar which are related to calciphylaxis. Wounds have improved slightly since previous admission, some patchy areas to right outer thigh have healed and evolved into pink dry scar tissue. Wound type: Multiple full thickness wounds; all are very painful. Left posterior leg 3.5X2cm, dry eschar without odor, drainage, or fluctuance. Right upper thigh with wounds of the same appearance as mentioned above; 1X1cm, 13X4cm, and 2X2cm.  In the middle of the upper thigh, there is a full thickness wound; 3X3X.3cm, 50% red and dry, 50% yellow slough.  No odor, small amt yellow drainage.  Right posterior calf 2.5X2.5 cm, dry eschar without odor, drainage, or fluctuance. Right anterior calf with full thickness wound; 4.5X3X.2cm, 30% red and dry, 70% yellow slough/soft eschar.  No odor, small amt yellow drainage.  Dressing procedure/placement/frequency: it is best practice to leave dry stable calciphylaxis intact, it can remain open to air. Santyl ointment to assist with chemical debridement of nonviable tissue to right calf and right upper thigh deeper wound. Pt could benefit from follow-up at the outpatient wound care center after discharge care center; please order if desired. Physician has ordered topical lidocaine to be applied PRN to painful wounds when dressings changes occur.  Discussed plan of care with patient and she verbalized understanding. Please re-consult if further assistance is needed.  Thank-you,  Cammie Mcgee MSN, RN, CWOCN, [place], CNS [phone]

## 2016-10-10 NOTE — Progress Notes (Signed)
RN encouraged patient to try Toradol and if not effective, additional measures can be taken and patient agreed. RN administered PRN Toradol 15 mg, and within a couple of minute was sound asleep, snoring. RN will reassess in 30 minutes.  Veatrice Kells, RN

## 2016-10-10 NOTE — Progress Notes (Signed)
ANTIBIOTIC CONSULT NOTE - Follow-up Consult  Pharmacy Consult for Vancomycin (and Cefepime)  Indication:  HCAP, possible wound infection  Assessment: 25 yo F with ESRD on HD MWF presented to [facility] ED with SOB and hyperkalemia. She was there from 12/17 > 12/18 with SOB, CP (elevated troponin), back pain, LLL pneumonia, and calciphylaxis. Pt has been seen at [facility] recently and requested transfer for continuity of care.   Rec'd Vancomycin and Zosyn prior to dialysis at OSH for HCAP. LLL PNA documented on CXR. VQ scan negative for PE. Patient is afebrile and WBC normal.   12/18 at noon, vancomycin random level was 36.7 mcg/mL.  12/18 ~4h of HD at [facility] prior to transfer to [facility]. No vancomycin given post-HD.   12/19 vancomycin random = 20 mcg/mL   Plan:  Vancomycin 500 mg IV qHD MWF  Vancomycin level as steady state and as needed  Pre-HD Vancomycin level goal: 15-25 mcg/ml Cefepime 2gm IV qHD days at 2000  Abx LOT = 8 days per consult order Follow-up HD plan  Follow-up cx data and clinical progress   Allergies  Allergen Reactions  . Aspirin Other (See Comments)    Interacts with Coreg  . Tramadol Anaphylaxis  . Vicodin [Hydrocodone-Acetaminophen] Hives  . Buprenorphine Hcl Itching    Ok with oxycodone  . Iohexol Itching  . Morphine And Related Itching and Other (See Comments)    Ok with oxycodone     Patient Measurements: Height: 5\' 3"  (160 cm) Weight: 118 lb 3.2 oz (53.6 kg) IBW/kg (Calculated) : 52.4  Vital Signs: Temp: 99.1 F (37.3 C) (12/19 0559) Temp Source: Oral (12/19 0559) BP: 100/70 (12/19 0559) Pulse Rate: 93 (12/19 0559)  Labs:  Recent Labs  10/09/16 2018 10/10/16 0403  HGB 8.4* 8.6*  HCT 27.9* 29.6*  PLT 293 335  HEPARINUNFRC  --  0.33  CREATININE 3.70* 4.12*  TROPONINI 0.23* 0.21*   Antimicrobials this admission:  Vanc + Zosyn at [facility] (started 12/17?) Vanco 12/18 >> Cefepime 12/18 >>  Dose adjustments this admission:  12/18 VR 36.7  (pre-HD at [facility]) 12/19 VR 20   Microbiology results:  12/18 MRSA PCR neg (at [facility]) 12/18 Flu neg (at [facility])  ESRD with HD - MWF - last HD 12/18 at [facility] (~4 hours)  York Cerise, PharmD Pharmacy Resident  Pager [phone] 10/10/16 8:46 AM

## 2016-10-10 NOTE — Telephone Encounter (Signed)
Medical Assistant left message on patient's home and cell voicemail. Voicemail states to give a call back to Ivyana Locey with [facility] at [phone].  

## 2016-10-10 NOTE — Progress Notes (Addendum)
Progress Note    [name] [name]  NTI:[id number] DOB: 28-Jan-1991  DOA: 10/09/2016 PCP: [name] Lewandowsky, MD    Brief Narrative:   Chief complaint: Follow up shortness of breath  [name] [name] is an 25 y.o. 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  Assessment/Plan:   Principal problems:   Chest pain/ischemic cardiomyopathy/chronic systolic CHF Evaluated by cardiologist. Per their notes: EF of 20% s/p AICD placement. She presented with chest pain, however has had a recent Echo without wall motion abnormality, a recent nuclear stress test that was negative for ischemia. Her EKG has chronic T wave inversion in [name] anterolateral leads and is unchanged from prior EKG's. Troponin is elevated with flat trend not consistent with ACS. No further cardiac work up is indicated at this time. [name] refuses heparin infusion. No evidence of pneumonia clinically, but would continue antibiotics that were initiated for this to treat her lower extremity wounds.  Volume overload and hyperkalemia Presented to [name] ED with shortness of breath. Found to be hyperkalemic on presentation. Initially presented to [facility] (records reviewed) where she had a VQ scan done 10/09/16 which was low probability  for pulmonary embolism (VQ scan ordered by admitting physician discontinued), and was started on heparin drip for elevated troponins, but she refused it. Dialyzed 10/09/16. Her potassium noted to be 6.5 and [name] [name] was given Kayexalate. Nephrology consulted.  Active Problems:   Anemia of chronic renal failure, stage 5 (HCC)/Hyperparathyroidism due to end stage renal disease on dialysis (HCC) Hemoglobin 8.6. Continue Aranesp.    ESRD on dialysis [facility]) Status post kidney transplant with failure. Has had recurrent hospital admissions for volume overload secondary to noncompliance with hemodialysis (7 hospital admissions in [name] last 6 months). HD/medications per nephrology.    Chronic pain syndrome/narcotic dependence/Thoracic compression fracture - T7, T8, T12 Review of records [name] showed that she was recently discharged from [facility] on 09/27/16 with 5 fentanyl 100 mcg patches and 60 oxycodone 10mg  pills. She was then seen by her PCP on 10/03/16, was given another 60 oxycodone 10 mg pills, and on presentation to [facility], claimed to have run out of her pain medications to days prior to her admission there. Given this, I have concerns that she is diverting her medications.    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 Not a good candidate for blood thinners given history of noncompliance.    Calciphylaxis/infected wounds/ Ulcer of left lower extremity, limited to breakdown of skin (HCC) Has a history of painful wounds/ulcers in her legs. She is on high doses of opiates for pain management. Continue Cefepime/Vancomycin. Continue wound care per recommendations.    Tobacco abuse Counseled.   Family Communication/Anticipated D/C date and plan/Code Status   DVT prophylaxis: Heparin ordered. Code Status: DO NOT RESUSCITATE.  Family Communication: No family at [name] bedside. Disposition Plan: Home when stable.   Medical Consultants:  Nephrology  Cardiology  Palliative Care   Procedures:    None  Anti-Infectives:    Cefepime and vancomycin 10/09/16--->  Subjective:   [name] [name] reports that She is having severe pain in her legs related to her open wounds. Denies nausea or vomiting. Has some chest tightness but minimal. Mildly short of breath.  Objective:    Vitals:   10/09/16 1946 10/10/16 0559  BP: 99/60 100/70  Pulse: 92 93  Resp: 19 18  Temp: 99.2 F (37.3 C) 99.1 F (37.3 C)  TempSrc: Oral Oral  SpO2: 98% 96%  Weight: 53.6 kg (118 lb 3.2 oz)   Height: 5\' 3"  (1.6 m)     Intake/Output Summary (Last 24 hours) at 10/10/16 0803 Last data filed at 10/10/16 0600  Gross per 24 hour  Intake           212.47 ml  Output                0 ml  Net           212.47 ml   Filed Weights   10/09/16 1946  Weight: 53.6 kg (118 lb 3.2 oz)    Exam: General exam: Appears Anxious. Respiratory system: A few scattered rhonchi on [name] right with increased work of breathing. Cardiovascular system: S1 & S2 heard, RRR. No JVD,  rubs, gallops or clicks. No murmurs. Gastrointestinal system: Abdomen is nondistended, soft and nontender. No organomegaly or masses felt. Normal bowel sounds heard. Central nervous system: Alert and oriented. No focal neurological deficits. Extremities: Multiple areas of eschar and skin breakdown. Skin: Skin breakdown to [name] lower extremities with eschar, most notable in [name] right anterior thigh with a deep ulcer draining per you went fluid as pictured below. Psychiatry: Judgement and insight appear impaired. Mood & affect appropriate.      Data Reviewed:   I have personally reviewed following labs and imaging studies:  Labs: Basic Metabolic Panel:  Recent Labs Lab 10/09/16 2018 10/10/16 0403  NA 139 140  K 4.3 4.1  CL 98* 96*  CO2 28 32  GLUCOSE 92 101*  BUN 21* 25*  CREATININE 3.70* 4.12*  CALCIUM 10.0 9.2  MG  --  2.1  PHOS  --  7.2*   GFR Estimated  Creatinine Clearance: 17.3 mL/min (by C-G formula based on SCr of 4.12 mg/dL (H)). Liver Function Tests:  Recent Labs Lab 10/09/16 2018 10/10/16 0403  AST 32 33  ALT 9* 10*  ALKPHOS 414* 446*  BILITOT 1.3* 1.1  PROT 7.3 7.4  ALBUMIN 3.0* 3.0*    CBC:  Recent Labs Lab 10/09/16 2018 10/10/16 0403  WBC 6.4 7.6  NEUTROABS 4.4  --   HGB 8.4* 8.6*  HCT 27.9* 29.6*  MCV 91.8 91.6  PLT 293 335   Cardiac Enzymes:  Recent Labs Lab 10/09/16 2018 10/10/16 0403  TROPONINI 0.23* 0.21*   Thyroid function studies:  Recent Labs  10/10/16 0403  TSH 3.424   Microbiology Recent Results (from [name] past 240 hour(s))  MRSA PCR Screening     Status: None   Collection Time: 10/09/16 10:30 PM  Result Value Ref Range Status   MRSA by PCR NEGATIVE NEGATIVE Final    Comment:        [name] GeneXpert MRSA Assay (FDA approved for NASAL specimens only), is one component of a comprehensive MRSA colonization surveillance program. It is not intended to diagnose MRSA infection nor to guide or monitor treatment for MRSA infections.  Radiology: No results found.  Medications:   . calcitonin (salmon)  1 spray Alternating Nares Daily  . ceFEPime (MAXIPIME) IV  2 g Intravenous Q M,W,F-HD  . cinacalcet  90 mg Oral Q supper  . fentaNYL  100 mcg Transdermal Q72H  . gabapentin  300 mg Oral QHS  . lanthanum  1,000 mg Oral TID WC  . LORazepam  1 mg Oral QHS  . mouth rinse  15 mL Mouth Rinse BID  . multivitamin  1 tablet Oral QHS  . senna  2 tablet Oral QHS  . sertraline  100 mg Oral Daily  . sodium chloride flush  3 mL Intravenous Q12H  . sodium chloride flush  3 mL Intravenous Q12H   Continuous Infusions: . heparin 700 Units/hr (10/09/16 2356)    Medical decision making is of high complexity and this [name] is at high risk of deterioration, therefore this is a level 3 visit.  (> 4 problem points, 1 data points, high risk)  LOS: 1 day   RAMA,CHRISTINA  [facility] Pager [phone]. If unable to reach me by pager, please call my cell phone at [phone].  *Please refer to [email or web address], password [email or web address] to get updated schedule on who will round on this [name], as hospitalists switch teams weekly. If 7PM-7AM, please contact night-coverage at [email or web address], password [email or web address] for any overnight needs.  10/10/2016, 8:03 AM

## 2016-10-10 NOTE — Progress Notes (Signed)
  Echocardiogram 2D Echocardiogram has been performed.  Janalyn Harder 10/10/2016, 12:43 PM

## 2016-10-10 NOTE — Progress Notes (Signed)
ANTICOAGULATION - Follow-up Consult  Pharmacy Consult for Heparin Indication: chest pain/ACS  Allergies  Allergen Reactions  . Aspirin Other (See Comments)    Interacts with Coreg  . Tramadol Anaphylaxis  . Vicodin [Hydrocodone-Acetaminophen] Hives  . Buprenorphine Hcl Itching    Ok with oxycodone  . Iohexol Itching  . Morphine And Related Itching and Other (See Comments)    Ok with oxycodone     Patient Measurements: Height: 5\' 3"  (160 cm) Weight: 118 lb 3.2 oz (53.6 kg) IBW/kg (Calculated) : 52.4 Heparin Dosing Weight: 53.6 kg  Vital Signs: Temp: 99.2 F (37.3 C) (12/18 1946) Temp Source: Oral (12/18 1946) BP: 99/60 (12/18 1946) Pulse Rate: 92 (12/18 1946)  Labs:  Recent Labs  10/09/16 2018 10/10/16 0403  HGB 8.4* 8.6*  HCT 27.9* 29.6*  PLT 293 335  HEPARINUNFRC  --  0.33  CREATININE 3.70*  --   TROPONINI 0.23*  --    Estimated Creatinine Clearance: 19.2 mL/min (by C-G formula based on SCr of 3.7 mg/dL (H)).  Assessment: 25 yo F presented to [facility] ED with SOB and hyperkalemia.  She was there from 12/17 > 12/18 with SOB, CP (elevated troponin), back pain, LLL pneumonia, and calciphylaxis.  Pt has been seen at [facility] recently and requested transfer for continuity of care. Pt on heparin for ACS. Heparin level therapeutic (0.33) on gtt at 700 units/hr. Hgb low but stable. No bleeding noted.  Goal of Therapy:  Heparin level 0.3-0.7 units/ml Monitor platelets by anticoagulation protocol: Yes    Plan:  Continue heparin 700 units/hr  Will f/u 6hr confirmatory heparin level  Christoper Fabian, PharmD, BCPS Clinical pharmacist, pager [phone] 10/10/2016 5:44 AM

## 2016-10-10 NOTE — Progress Notes (Deleted)
Electrophysiology Office Note   Date:  10/10/2016   ID:  [name] SorrowVictoria Rankin, DOB 12/01/1990, MRN [id number]  PCP:  Jeanann LewandowskyJEGEDE, OLUGBEMIGA, MD Primary Electrophysiologist:  Regan LemmingWill Martin Camnitz, MD    No chief complaint on file.    History of Present Illness: [name] [name] is a 25 y.o. female who presents today for electrophysiology evaluation.   She has a history of extensive medical history including end-stage renal disease on hemodialysis, pulmonary embolism, congestive heart failure status post AICD placement with low ejection fraction of 20-25% in January 2017, chronic pain syndrome and hypertension.   Today, she denies symptoms of palpitations, chest pain, shortness of breath, orthopnea, PND, lower extremity edema, claudication, dizziness, presyncope, syncope, bleeding, or neurologic sequela. The patient is tolerating medications without difficulties.     Past Medical History:  Diagnosis Date  . AICD (automatic cardioverter/defibrillator) present 12/16/14   [employer]  . Anemia   . Anxiety   . Cardiomyopathy   . Cellulitis and abscess of face 03/22/2013  . CHF (congestive heart failure) (HCC)   . Chronic anticoagulation   . Chronic pain   . Depression   . DVT (deep venous thrombosis) (HCC) ~ 2014   BLE  . Dysrhythmia    at times per pt.  . 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  Marland Kitchen. ESRD (end stage renal disease) on dialysis [facility](HCC)    "MWF; [facility] " (09/2016)  . Fracture, thoracic vertebra (HCC) 07/2016   "T7-T12"  . GERD (gastroesophageal reflux disease)   . H/O transfusion of packed red blood cells   . Heart murmur   . Hypertension   . Narcotic abuse, continuous   . Osteoporosis   . Pelvic fracture (HCC) 06/2015   "on the right"  . Pulmonary emboli (HCC) 01/2012   Bilateral, moderate clot burden, areas of pulmonary  infarction and central necrosis  . Sickle cell anemia (HCC)    Past Surgical History:  Procedure Laterality Date  . AV FISTULA PLACEMENT Bilateral    "right side stopped working & never had it revised" (04/11/2016)  . AV FISTULA PLACEMENT Right 08/01/2016   Procedure: INSERTION OF ARTERIOVENOUS (AV) GORE-TEX GRAFT RIGHT ARM;  Surgeon: Chuck Hinthristopher S Dickson, MD;  [facility];  Service: Vascular;  Laterality: Right;  . AV FISTULA PLACEMENT Right 08/31/2016   Procedure: INSERTION OF ARTERIOVENOUS (AV) GORE-TEX GRAFT- RIGHT FEMORAL LOOP GRAFT;  Surgeon: Maeola HarmanBrandon Christopher Cain, MD;  [facility];  Service: Vascular;  Laterality: Right;  . AVGG REMOVAL Right 08/03/2016   Procedure: REMOVAL OF ARTERIOVENOUS GORETEX GRAFT (AVGG);  Surgeon: Maeola HarmanBrandon Christopher Cain, MD;  [facility];  Service: Vascular;  Laterality: Right;  . CARDIAC CATHETERIZATION    . IMPLANTABLE CARDIOVERTER DEFIBRILLATOR IMPLANT Right 12/2014  . INCISION AND DRAINAGE ABSCESS Right 03/21/2013   Procedure: INCISION AND DRAINAGE RIGHT CHEEK ABSCESS REMOVAL OF FOREIGN BODY;  Surgeon: Serena ColonelJefry Rosen, MD;  [facility];  Service: ENT;  Laterality: Right;  . INSERTION OF DIALYSIS CATHETER Right Mar 09, 2016   th[facility]  . INSERTION OF DIALYSIS CATHETER Left 09/02/2016   Procedure: Insertion of Dialysis Catheter Left Femoral Vein;  Surgeon: Maeola HarmanBrandon Christopher Cain, MD;  [facility];  Service: Vascular;  Laterality: Left;  . 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 NEPHRECTOMY Right     kidney placed in 2008, and body rejected in  2012   . PERIPHERAL VASCULAR CATHETERIZATION Left 03/09/2016   Procedure: A/V Shuntogram/Fistulagram;  Surgeon: Annice Needy, MD;  Location: [facility] INVASIVE CV LAB;  Service: Cardiovascular;  Laterality: Left;  . PERIPHERAL VASCULAR CATHETERIZATION N/A 05/09/2016   Procedure: Dialysis/Perma Catheter Removal;  Surgeon: Renford Dills, MD;  Location: [facility] INVASIVE CV LAB;  Service:  Cardiovascular;  Laterality: N/A;  . PERIPHERAL VASCULAR CATHETERIZATION N/A 07/12/2016   Procedure: Dialysis/Perma Catheter Insertion;  Surgeon: Annice Needy, MD;  Location: [facility] INVASIVE CV LAB;  Service: Cardiovascular;  Laterality: N/A;  . PERIPHERAL VASCULAR CATHETERIZATION Right 07/31/2016   Procedure: Upper Extremity Venography;  Surgeon: Maeola Harman, MD;  Location: [facility] INVASIVE CV LAB;  Service: Cardiovascular;  Laterality: Right;  . REMOVAL OF GRAFT Right 09/15/2016   Procedure: REMOVAL OF RIGHT THIGH GRAFT;  Surgeon: Sherren Kerns, MD;  Location[facility] OR;  Service: Vascular;  Laterality: Right;  . REVISON OF ARTERIOVENOUS FISTULA Left 03/22/2016   Procedure: REVISON OF ARTERIOVENOUS FISTULA ( ARTEGRAFT );  Surgeon: Annice Needy, MD;  Location: [facility] ORS;  Service: Vascular;  Laterality: Left;  . TONSILLECTOMY AND ADENOIDECTOMY  ~ 2000     No current facility-administered medications for this visit.    No current outpatient prescriptions on file.   Facility-Administered Medications Ordered in Other Visits  Medication Dose Route Frequency Provider Last Rate Last Dose  . 0.9 %  sodium chloride infusion  250 mL Intravenous PRN Therisa Doyne, MD      . acetaminophen (TYLENOL) tablet 650 mg  650 mg Oral Q6H PRN Therisa Doyne, MD       Or  . acetaminophen (TYLENOL) suppository 650 mg  650 mg Rectal Q6H PRN Therisa Doyne, MD      . calcitonin (salmon) (MIACALCIN/FORTICAL) nasal spray 1 spray  1 spray Alternating Nares Daily Therisa Doyne, MD      . ceFEPIme (MAXIPIME) 2 g in dextrose 5 % 50 mL IVPB  2 g Intravenous Q M,W,F-HD Kimberly B Hammons, RPH   2 g at 10/10/16 0106  . cinacalcet (SENSIPAR) tablet 90 mg  90 mg Oral Q supper Therisa Doyne, MD      . famotidine (PEPCID) tablet 20 mg  20 mg Oral BID PRN Therisa Doyne, MD      . fentaNYL (DURAGESIC - dosed mcg/hr) 100 mcg  100 mcg Transdermal Q72H Therisa Doyne, MD   100 mcg at 10/09/16 2156    . gabapentin (NEURONTIN) capsule 300 mg  300 mg Oral QHS Therisa Doyne, MD   300 mg at 10/09/16 2156  . heparin ADULT infusion 100 units/mL (25000 units/238mL sodium chloride 0.45%)  700 Units/hr Intravenous Continuous Gerhard Munch Hammons, RPH 7 mL/hr at 10/09/16 2356 700 Units/hr at 10/09/16 2356  . HYDROmorphone (DILAUDID) injection 1 mg  1 mg Intravenous Q4H PRN Leda Gauze, NP   1 mg at 10/10/16 0503  . ketorolac (TORADOL) 15 MG/ML injection 15 mg  15 mg Intravenous Q6H PRN Therisa Doyne, MD   15 mg at 10/10/16 0106  . lanthanum (FOSRENOL) chewable tablet 1,000 mg  1,000 mg Oral TID WC Therisa Doyne, MD      . LORazepam (ATIVAN) tablet 1 mg  1 mg Oral QHS Therisa Doyne, MD   1 mg at 10/09/16 2156  . MEDLINE mouth rinse  15 mL Mouth Rinse BID Therisa Doyne, MD      . methocarbamol (ROBAXIN) tablet 500 mg  500 mg Oral Q6H PRN Therisa Doyne, MD      .  multivitamin (RENA-VIT) tablet 1 tablet  1 tablet Oral QHS Therisa Doyne, MD      . ondansetron (ZOFRAN) tablet 4 mg  4 mg Oral Q6H PRN Therisa Doyne, MD       Or  . ondansetron (ZOFRAN) injection 4 mg  4 mg Intravenous Q6H PRN Therisa Doyne, MD      . oxyCODONE (Oxy IR/ROXICODONE) immediate release tablet 30 mg  30 mg Oral Q4H PRN Therisa Doyne, MD   30 mg at 10/09/16 2150  . senna (SENOKOT) tablet 17.2 mg  2 tablet Oral QHS Therisa Doyne, MD      . sertraline (ZOLOFT) tablet 100 mg  100 mg Oral Daily Therisa Doyne, MD      . sodium chloride flush (NS) 0.9 % injection 3 mL  3 mL Intravenous Q12H Therisa Doyne, MD   3 mL at 10/09/16 2158  . sodium chloride flush (NS) 0.9 % injection 3 mL  3 mL Intravenous Q12H Therisa Doyne, MD   3 mL at 10/09/16 2158  . sodium chloride flush (NS) 0.9 % injection 3 mL  3 mL Intravenous PRN Therisa Doyne, MD        Allergies:   Aspirin; Tramadol; Vicodin [hydrocodone-acetaminophen]; Buprenorphine hcl; Iohexol; and Morphine and  related   Social History:  The patient  reports that she quit smoking about 10 months ago. Her smoking use included Cigarettes. She smoked 0.00 packs per day for 1.00 year. She has never used smokeless tobacco. She reports that she does not drink alcohol or use drugs.   Family History:  The patient's family history includes Hypertension in her father; Polycystic kidney disease in her father.    ROS:  Please see the history of present illness.   Otherwise, review of systems is positive for ***.   All other systems are reviewed and negative.    PHYSICAL EXAM: VS:   , BMI There is no height or weight on file to calculate BMI. GEN: Well nourished, well developed, in no acute distress  HEENT: normal  Neck: no JVD, carotid bruits, or masses Cardiac: ***tachycardic; no murmurs, rubs, or gallops,no edema  Respiratory:  clear to auscultation bilaterally, normal work of breathing GI: soft, nontender, nondistended, + BS MS: no deformity or atrophy  Skin: warm and dry,  device pocket is well healed Neuro:  Strength and sensation are intact Psych: euthymic mood, full affect  EKG:  EKG is ordered today. Personal review of the ECG ordered today shows ***   Device interrogation is reviewed today in detail.  See PaceArt for details.   Recent Labs: 07/26/2016: B Natriuretic Peptide >4,500.0 10/10/2016: ALT 10; BUN 25; Creatinine, Ser 4.12; Hemoglobin 8.6; Magnesium 2.1; Platelets 335; Potassium 4.1; Sodium 140; TSH 3.424    Lipid Panel     Component Value Date/Time   CHOL 137 06/17/2016 0413   TRIG 51 06/17/2016 0413   HDL 58 06/17/2016 0413   CHOLHDL 2.4 06/17/2016 0413   VLDL 10 06/17/2016 0413   LDLCALC 69 06/17/2016 0413     Wt Readings from Last 3 Encounters:  10/09/16 118 lb 3.2 oz (53.6 kg)  10/03/16 110 lb (49.9 kg)  09/27/16 109 lb 5.6 oz (49.6 kg)      Other studies Reviewed: Additional studies/ records that were reviewed today include: TTE 06/19/16 Review of the above  records today demonstrates:  - Left ventricle: The cavity size was mildly dilated. Wall   thickness was normal. Systolic function was severely reduced. The   estimated  ejection fraction was in the range of 20% to 25%.   Diffuse hypokinesis. The study is not technically sufficient to   allow evaluation of LV diastolic function. - Aortic valve: Sclerosis without stenosis. There was no   regurgitation. - Mitral valve: Mildly thickened leaflets . There was moderate   regurgitation. - Left atrium: Moderately dilated. - Right ventricle: The cavity size was moderately dilated. Pacer   wire or catheter noted in right ventricle. Decreased systolic   function. - Right atrium: The atrium was at the upper limits of normal in   size. Pacer wire or catheter noted in right atrium. - Atrial septum: A septal defect cannot be excluded. The IAS bows   from right to left, suggesting RA pressure > LA pressure. - Tricuspid valve: There was mild regurgitation. More than 35%   mitral inflow variation with respiration. - Pulmonary arteries: PA peak pressure: 28 mm Hg (S). - Systemic veins: The IVC measures <2.1 cm, but does not collapse   >50%, suggesting an elevated RA pressure of 8 mmHg. - Pericardium, extracardiac: [name] pericardial effusion. Features   were not consistent with tamponade physiology.   ASSESSMENT AND PLAN:  1.  Nonischemic cardiomyopathy: [employer] ICD in place. ***  2. Chronic kidney disease: Currently on dialysis with a left upper extremity fistula.   3. Pericarditis: Chest pain associated with lying flat and taking a deep breath consistent with pericarditis. She is a dialysis patient and therefore will need a lower dose of colchicine. We'll give her 0.3 mg twice a week.  The patient does not have concerns regarding her medicines.  The following changes were made today:  Decrease norvasc to 5 mg, start hydralazine 25 mg TID, increase coreg to 12.5 mg BID  Labs/ tests ordered  today include:  No orders of the defined types were placed in this encounter.    Disposition:   FU with Will Camnitz 3  months  Signed, Will Jorja Loa, MD  10/10/2016 8:59 AM     [facility] [address] [place] [address] [phone] (office) [phone] (fax)

## 2016-10-10 NOTE — Progress Notes (Signed)
Patient complaining of pain. RN went in room to administer PRN pain medicine and patient found asleep. RN woke patient up and patient would fall right back to sleep. RN will continue to monitor patient.  Veatrice Kells, RN

## 2016-10-10 NOTE — Consult Note (Signed)
Consultation Note Date: 10/10/2016   Patient Name: [name] [name]  DOB: [birth year]  MRN: [id number]  Age / Sex: 25 y.o., female  PCP: Quentin Angst, MD Referring Physician: Maryruth Bun Rama, MD  Reason for Consultation: Pain control  HPI/Patient Profile: 25 y.o. female  with past medical history of polycystic kidney disease on hemodialysis, CHF with an EF of 10 - 15% ICD in place, sickle cell anemia, PE, DVT, and most recently calciphylaxis wounds who was admitted on 10/09/2016 with chest pain on inspiration, hyperkalemia, volume overload and calciphylaxis pain.   PMT was called as we have seen [name] [name] in the past to address her pain management.    Clinical Assessment and Goals of Care: [name] [name] tells me she was doing fairly well at home.  She is still living in her apartment.  Her boyfriend and parents are being supportive of her.  She did go to her PCP for fentanyl TD refills but was re-directed to a pain management specialist and consequently was out of her pain medication.  Her pain is primarily in her legs but also in her back.  Her leg pain is constant, burning, and worse with movement.  Nothing short of pain medication relieves the pain.  Her wounds have improved slightly per WOC note, but patient is fixated on them and they cause her not only a lot of pain but also anxiety.    Pain recommendations below.  Primary Decision Maker:  PATIENT    SUMMARY OF RECOMMENDATIONS    Code Status/Advance Care Planning:  DNR    Symptom Management:  For calciphylaxis pain  Fentanyl patch 100 mcg  Gabapentin - increased dose to 300 mg bid  Lidocaine gel to be applied as needed   Will add low dose IV dilaudid q8 PRN for 24 hours.   Patient will need follow up at [facility] for calciphylaxis wounds  She will also need a provider to write her pain medications outpatient.  Palliative does not  have an outpatient clinic to provide this service.  I will look into this further while she is in the hospital.  Without pain medications patient will be forced to immediately return to the ER.  Recommend a pain consultation with Dr. Mallie Darting to consider other interventions for pain.  Will also recommend that Palliative Care follow her outpatient.   Prognosis:   Unable to determine  Discharge Planning: Home with Palliative Services as well as follow up at the wound care center.      Primary Diagnoses: Present on Admission: . Hyperkalemia . Volume overload . Ulcer of left lower extremity, limited to breakdown of skin (HCC) . Tobacco abuse . Thoracic compression fracture - T7, T8, T12 . Pain in the chest . Hypertension . Essential hypertension . Chronic systolic heart failure (HCC) . Chronic pain syndrome . 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 Calciphylaxis . Anemia of chronic renal failure, stage 5 (HCC)   I have reviewed the medical record, interviewed the patient and family, and  examined the patient. The following aspects are pertinent.  Past Medical History:  Diagnosis Date  . AICD (automatic cardioverter/defibrillator) present 12/16/14   [employer]  . Anemia   . Anxiety   . Cardiomyopathy   . Cellulitis and abscess of face 03/22/2013  . CHF (congestive heart failure) (HCC)   . Chronic anticoagulation   . Chronic pain   . Depression   . DVT (deep venous thrombosis) (HCC) ~ 2014   BLE  . Dysrhythmia    at times per pt.  . 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  Marland Kitchen. ESRD (end stage renal disease) on dialysis [facility](HCC)    "MWF; [facility] " (09/2016)  . Fracture, thoracic vertebra (HCC) 07/2016   "T7-T12"  . GERD (gastroesophageal reflux disease)   . H/O transfusion of packed red blood cells   .  Heart murmur   . Hypertension   . Narcotic abuse, continuous   . Osteoporosis   . Pelvic fracture (HCC) 06/2015   "on the right"  . Pulmonary emboli (HCC) 01/2012   Bilateral, moderate clot burden, areas of pulmonary infarction and central necrosis  . Sickle cell anemia (HCC)    Social History   Social History  . Marital status: Single    Spouse name: N/A  . Number of children: N/A  . Years of education: N/A   Social History Main Topics  . Smoking status: Former Smoker    Packs/day: 0.00    Years: 1.00    Types: Cigarettes    Quit date: 12/08/2015  . Smokeless tobacco: Never Used  . Alcohol use No  . Drug use: No  . Sexual activity: Not Currently    Birth control/ protection: None   Other Topics Concern  . Not on file   Social History Narrative   Was smoking 3 cigs per day. Lives at home with roommates, has some family locally.    Family History  Problem Relation Age of Onset  . Polycystic kidney disease Father   . Hypertension Father    Scheduled Meds: . calcitonin (salmon)  1 spray Alternating Nares Daily  . [START ON 10/11/2016] ceFEPime (MAXIPIME) IV  2 g Intravenous Q M,W,F-2000  . cinacalcet  90 mg Oral Q supper  . collagenase   Topical Daily  . [START ON 10/11/2016] darbepoetin (ARANESP) injection - DIALYSIS  100 mcg Intravenous Q Wed-HD  . feeding supplement (NEPRO CARB STEADY)  237 mL Oral BID BM  . fentaNYL  100 mcg Transdermal Q72H  . gabapentin  300 mg Oral BID  . lanthanum  1,000 mg Oral TID WC  . lidocaine  1 application Topical Once  . LORazepam  1 mg Oral QHS  . mouth rinse  15 mL Mouth Rinse BID  . multivitamin  1 tablet Oral QHS  . senna  2 tablet Oral QHS  . sertraline  100 mg Oral Daily  . sodium chloride flush  3 mL Intravenous Q12H  . sodium chloride flush  3 mL Intravenous Q12H  . sodium thiosulfate infusion for calciphylaxis  25 g Intravenous Once  . [START ON 10/11/2016] sodium thiosulfate infusion for calciphylaxis  25 g Intravenous  Once  . [START ON 10/11/2016] vancomycin  500 mg Intravenous Q M,W,F-HD  . vancomycin  500 mg Intravenous Q T,Th,Sa-HD   Continuous Infusions: . heparin 700 Units/hr (10/09/16 2356)   PRN Meds:.sodium chloride, acetaminophen **OR**  acetaminophen, famotidine, HYDROmorphone (DILAUDID) injection, HYDROmorphone, methocarbamol, ondansetron **OR** ondansetron (ZOFRAN) IV, oxyCODONE, sodium chloride flush Allergies  Allergen Reactions  . Aspirin Other (See Comments)    Interacts with Coreg  . Tramadol Anaphylaxis  . Vicodin [Hydrocodone-Acetaminophen] Hives  . Buprenorphine Hcl Itching    Ok with oxycodone  . Iohexol Itching  . Morphine And Related Itching and Other (See Comments)    Ok with oxycodone    Review of Systems  HENT: Negative.   Eyes: Negative.   Respiratory: Positive for cough, chest tightness and shortness of breath.   Cardiovascular: Positive for chest pain.  Gastrointestinal: Negative.   Endocrine: Negative.   Genitourinary:       NA  Musculoskeletal: Positive for back pain.  Skin: Positive for wound.  Allergic/Immunologic: Negative.   Neurological: Positive for weakness.  Hematological: Negative.   Psychiatric/Behavioral: Negative for agitation, behavioral problems and confusion.     Physical Exam  Constitutional: She is oriented to person, place, and time.  Young appearing female, awake and alert with clear speech.  HENT:  Head: Normocephalic and atraumatic.  Eyes: EOM are normal. Pupils are equal, round, and reactive to light. No scleral icterus.  Neck: Normal range of motion. Neck supple.  Cardiovascular: Normal rate and normal heart sounds.   Pulmonary/Chest: Effort normal and breath sounds normal.  Cp with inspiration.  Abdominal: Soft. Bowel sounds are normal.  Neurological: She is alert and oriented to person, place, and time.  Skin: Skin is warm and dry.  Multiple wounds on lower extremities.  Psychiatric: She has a normal mood and affect. Her  behavior is normal.    Vital Signs: BP 99/68 (BP Location: Left Arm)   Pulse 92   Temp 97.9 F (36.6 C) (Oral)   Resp 18   Ht 5\' 3"  (1.6 m)   Wt 53.6 kg (118 lb 3.2 oz)   SpO2 98%   BMI 20.94 kg/m  Pain Assessment: 0-10   Pain Score: Asleep   SpO2: SpO2: 98 % O2 Device:SpO2: 98 % O2 Flow Rate: .O2 Flow Rate (L/min): 3 L/min  IO: Intake/output summary:  Intake/Output Summary (Last 24 hours) at 10/10/16 1500 Last data filed at 10/10/16 1320  Gross per 24 hour  Intake           212.47 ml  Output                0 ml  Net           212.47 ml    LBM: Last BM Date: 10/10/16 Baseline Weight: Weight: 53.6 kg (118 lb 3.2 oz) Most recent weight: Weight: 53.6 kg (118 lb 3.2 oz)     Palliative Assessment/Data:     Time In: 2:30 Time Out: 3:30 Time Total: 60 min. Greater than 50%  of this time was spent counseling and coordinating care related to the above assessment and plan.  Signed by: Algis Downs, PA-C Palliative Medicine Pager: [phone]  Please contact Palliative Medicine Team phone at [phone] for questions and concerns.  For individual provider: See Loretha Stapler

## 2016-10-10 NOTE — Progress Notes (Signed)
Cardiology was consulted for elevated troponin in this very unfortunate patient with CKD on HD (intermittently compliant with HD) due to polycystic kidney disease, calaphyalaxis that causes her chronic pain, nonischemic cardiomyopathy with EF of 20% s/p AICD placement. She presented with chest pain, however has had a recent Echo without wall motion abnormality, a recent nuclear stress test that was negative for ischemia. Her EKG has chronic T wave inversion in the anterolateral leads and is unchanged from prior EKG's. Troponin is elevated with flat trend not consistent with ACS. No further cardiac work up is indicated at this time.    Agree with note by Suzzette Righter NP  Pt is DNR. NISCM. Atyp CP with flat low trop. Chronic EKG changes. Recent nonischemic MV. No further w/u necessary at this time.   Runell Gess, M.D., FACP, [facility], [facility] [address] [place], [place]  [address]  [phone] 10/10/2016 1:14 PM

## 2016-10-10 NOTE — Consult Note (Signed)
[facility] Renal Consultation Note    Indication for Consultation:  Management of ESRD/hemodialysis, anemia, hypertension/volume, and secondary hyperparathyroidism. PCP:  HPI: Dorothye Berni is a 25 y.o. female with ESRD due to polycystic kidney disease, cardiomyopathy s/p AICD placement (EF 12%), Hx multiple thoracic compression fractures, severe secondary hyperparathyroidism, Hx DVT/PC, and LE calciphylaxis who was admitted as transfer from [facility] with chest pain with inspiration and possible pneumonia.   At [facility], she was dialyzed for hyperkalemia and dyspnea on 12/18. Started on Vanc/Zosyn for possible pneumonia seen on CXR.   Cardiology has evaluated her today. She underwent echo today which shows improved EF ~30% but with moderate pericardial effusion which could be contributing to her CP. She is dyspneic with minimal exertion and still requiring nasal oxygen. Denies N/V/D. Having significant pain associated with her calciphylaxis lesions.  Past Medical History:  Diagnosis Date  . 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 Anemia   . Anxiety   . Cardiomyopathy   . Cellulitis and abscess of face 03/22/2013  . CHF (congestive heart failure) (HCC)   . Chronic anticoagulation   . Chronic pain   . Depression   . DVT (deep venous thrombosis) (HCC) ~ 2014   BLE  . Dysrhythmia    at times per pt.  . 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  Marland Kitchen ESRD (end stage renal disease) on dialysis [facility])    "MWF; [facility] " (09/2016)  . Fracture, thoracic vertebra (HCC) 07/2016   "T7-T12"  . GERD (gastroesophageal reflux disease)   . H/O transfusion of packed red blood cells   . Heart murmur   . Hypertension   . Narcotic abuse, continuous   . Osteoporosis   . Pelvic fracture (HCC) 06/2015   "on the right"  . Pulmonary emboli (HCC) 01/2012   Bilateral, moderate clot burden, areas of pulmonary infarction and central necrosis  . Sickle cell anemia (HCC)    Past Surgical History:  Procedure Laterality Date  . AV FISTULA PLACEMENT Bilateral    "right side stopped working & never had it revised" (04/11/2016)  . AV FISTULA PLACEMENT Right 08/01/2016   Procedure: INSERTION OF ARTERIOVENOUS (AV) GORE-TEX GRAFT RIGHT ARM;  Surgeon: Chuck Hint, MD;  Location: [facility] OR;  Service: Vascular;  Laterality: Right;  . AV FISTULA PLACEMENT Right 08/31/2016   Procedure: INSERTION OF ARTERIOVENOUS (AV) GORE-TEX GRAFT- RIGHT FEMORAL LOOP GRAFT;  Surgeon: Maeola Harman, MD;  Location: [facility] OR;  Service: Vascular;  Laterality: Right;  . AVGG REMOVAL Right 08/03/2016   Procedure: REMOVAL OF ARTERIOVENOUS GORETEX GRAFT (AVGG);  Surgeon: Maeola Harman, MD;  Location: [facility] OR;  Service: Vascular;  Laterality: Right;  . CARDIAC CATHETERIZATION    . IMPLANTABLE CARDIOVERTER DEFIBRILLATOR IMPLANT Right 12/2014  . INCISION AND DRAINAGE ABSCESS Right 03/21/2013   Procedure: INCISION AND DRAINAGE RIGHT CHEEK ABSCESS REMOVAL OF FOREIGN BODY;  Surgeon: Serena Colonel, MD;  Location: [facility] OR;  Service: ENT;  Laterality: Right;  . INSERTION OF DIALYSIS CATHETER Right Mar 09, 2016   thigh, Dr. Wyn Quaker, [facility]  . INSERTION OF DIALYSIS CATHETER Left 09/02/2016   Procedure: Insertion of Dialysis Catheter Left Femoral Vein;  Surgeon: Maeola Harman, MD;  Location: [facility] OR;  Service: Vascular;  Laterality: Left;  . 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 NEPHRECTOMY Right  kidney placed in 2008, and body rejected in 2012   . PERIPHERAL VASCULAR CATHETERIZATION Left 03/09/2016   Procedure: A/V Shuntogram/Fistulagram;  Surgeon: Annice Needy, MD;  Location: [facility] INVASIVE CV LAB;  Service: Cardiovascular;  Laterality: Left;  . PERIPHERAL VASCULAR CATHETERIZATION N/A 05/09/2016   Procedure: Dialysis/Perma  Catheter Removal;  Surgeon: Renford Dills, MD;  Location: [facility] INVASIVE CV LAB;  Service: Cardiovascular;  Laterality: N/A;  . PERIPHERAL VASCULAR CATHETERIZATION N/A 07/12/2016   Procedure: Dialysis/Perma Catheter Insertion;  Surgeon: Annice Needy, MD;  Location: [facility] INVASIVE CV LAB;  Service: Cardiovascular;  Laterality: N/A;  . PERIPHERAL VASCULAR CATHETERIZATION Right 07/31/2016   Procedure: Upper Extremity Venography;  Surgeon: Maeola Harman, MD;  Location: [facility] INVASIVE CV LAB;  Service: Cardiovascular;  Laterality: Right;  . REMOVAL OF GRAFT Right 09/15/2016   Procedure: REMOVAL OF RIGHT THIGH GRAFT;  Surgeon: Sherren Kerns, MD;  Location: [facility] OR;  Service: Vascular;  Laterality: Right;  . REVISON OF ARTERIOVENOUS FISTULA Left 03/22/2016   Procedure: REVISON OF ARTERIOVENOUS FISTULA ( ARTEGRAFT );  Surgeon: Annice Needy, MD;  Location: [facility] ORS;  Service: Vascular;  Laterality: Left;  . TONSILLECTOMY AND ADENOIDECTOMY  ~ 2000   Family History  Problem Relation Age of Onset  . Polycystic kidney disease Father   . Hypertension Father    Social History:  reports that she quit smoking about 10 months ago. Her smoking use included Cigarettes. She smoked 0.00 packs per day for 1.00 year. She has never used smokeless tobacco. She reports that she does not drink alcohol or use drugs.  ROS: As per HPI otherwise negative.  Physical Exam: Vitals:   10/09/16 1946 10/10/16 0559 10/10/16 1006  BP: 99/60 100/70 99/68  Pulse: 92 93 92  Resp: 19 18 18   Temp: 99.2 F (37.3 C) 99.1 F (37.3 C) 97.9 F (36.6 C)  TempSrc: Oral Oral Oral  SpO2: 98% 96% 98%  Weight: 53.6 kg (118 lb 3.2 oz)    Height: 5\' 3"  (1.6 m)       General: Frail female, NAD. On nasal oxygen. Head: Normocephalic, atraumatic, sclera non-icteric, mucus membranes are moist. Lungs: Unable to examine posterior due to severe pain with movements, rhonchi in lower anterior fields. Heart: RRR with normal S1, S2. No  murmurs, rubs, or gallops appreciated. Abdomen: Soft, mild generalized tenderness Lower extremities: No LE edema. Necrotic wounds on R lower leg and thigh. Neuro: Alert and oriented X 3. Moves all extremities spontaneously. Psych:  Responds to questions appropriately with a normal affect. Dialysis Access: Femoral PC in L groin.  Allergies  Allergen Reactions  . Aspirin Other (See Comments)    Interacts with Coreg  . Tramadol Anaphylaxis  . Vicodin [Hydrocodone-Acetaminophen] Hives  . Buprenorphine Hcl Itching    Ok with oxycodone  . Iohexol Itching  . Morphine And Related Itching and Other (See Comments)    Ok with oxycodone    Prior to Admission medications   Medication Sig Start Date End Date Taking? Authorizing Provider  calcitonin, salmon, (MIACALCIN) 200 UNIT/ACT nasal spray Place 1 spray into alternate nostrils daily. 09/01/16  Yes Daniel J Angiulli, PA-C  calcitRIOL (ROCALTROL) 0.5 MCG capsule Take 0.5 mcg by mouth every Monday, Wednesday, and Friday. 08/04/16  Yes Historical Provider, MD  carvedilol (COREG) 6.25 MG tablet Take 6.25 mg by mouth 2 (two) times daily. 08/04/16  Yes Historical Provider, MD  cinacalcet (SENSIPAR) 30 MG tablet Take 3 tablets (90 mg total) by mouth daily with  supper. Patient taking differently: Take 30 mg by mouth daily with supper.  09/01/16  Yes Daniel J Angiulli, PA-C  famotidine (PEPCID) 20 MG tablet Take 1 tablet (20 mg total) by mouth 2 (two) times daily as needed for heartburn or indigestion. 09/06/16  Yes Ankit Karis JubaAnil Patel, MD  fentaNYL (DURAGESIC - DOSED MCG/HR) 100 MCG/HR Place 1 patch (100 mcg total) onto the skin every 3 (three) days. 09/29/16  Yes Zannie CovePreetha Joseph, MD  gabapentin (NEURONTIN) 100 MG capsule Take 3 capsules (300 mg total) by mouth at bedtime. Patient taking differently: Take 100-200 mg by mouth 2 (two) times daily. 100mg  at breakfast and 200mg  at lunch 09/27/16  Yes Zannie CovePreetha Joseph, MD  lanthanum (FOSRENOL) 1000 MG chewable tablet  Chew 1 tablet (1,000 mg total) by mouth 3 (three) times daily with meals. 09/01/16  Yes Daniel J Angiulli, PA-C  LORazepam (ATIVAN) 1 MG tablet Take 1 tablet (1 mg total) by mouth at bedtime. Patient taking differently: Take 1 mg by mouth daily.  09/05/16  Yes Quentin Angstlugbemiga E Jegede, MD  methocarbamol (ROBAXIN) 500 MG tablet Take 1 tablet (500 mg total) by mouth every 6 (six) hours as needed for muscle spasms. 09/01/16  Yes Daniel J Angiulli, PA-C  multivitamin (RENA-VIT) TABS tablet Take 1 tablet by mouth daily. 09/01/16  Yes Daniel J Angiulli, PA-C  Oxycodone HCl 10 MG TABS Take 3 tablets (30 mg total) by mouth every 4 (four) hours as needed. 10/03/16  Yes Quentin Angstlugbemiga E Jegede, MD  sertraline (ZOLOFT) 100 MG tablet Take 1 tablet (100 mg total) by mouth daily. 09/01/16  Yes Daniel J Angiulli, PA-C   Current Facility-Administered Medications  Medication Dose Route Frequency Provider Last Rate Last Dose  . 0.9 %  sodium chloride infusion  250 mL Intravenous PRN Therisa DoyneAnastassia Doutova, MD      . acetaminophen (TYLENOL) tablet 650 mg  650 mg Oral Q6H PRN Therisa DoyneAnastassia Doutova, MD       Or  . acetaminophen (TYLENOL) suppository 650 mg  650 mg Rectal Q6H PRN Therisa DoyneAnastassia Doutova, MD      . calcitonin (salmon) (MIACALCIN/FORTICAL) nasal spray 1 spray  1 spray Alternating Nares Daily Therisa DoyneAnastassia Doutova, MD   1 spray at 10/10/16 0956  . [START ON 10/11/2016] ceFEPIme (MAXIPIME) 2 g in dextrose 5 % 50 mL IVPB  2 g Intravenous Q M,W,F-2000 Cherre HugerKatherine R Cook, RPH      . cinacalcet (SENSIPAR) tablet 90 mg  90 mg Oral Q supper Therisa DoyneAnastassia Doutova, MD      . collagenase (SANTYL) ointment   Topical Daily Christina P Rama, MD      . famotidine (PEPCID) tablet 20 mg  20 mg Oral BID PRN Therisa DoyneAnastassia Doutova, MD      . fentaNYL (DURAGESIC - dosed mcg/hr) 100 mcg  100 mcg Transdermal Q72H Therisa DoyneAnastassia Doutova, MD   100 mcg at 10/09/16 2156  . gabapentin (NEURONTIN) capsule 300 mg  300 mg Oral QHS Therisa DoyneAnastassia Doutova, MD   300 mg at  10/09/16 2156  . heparin ADULT infusion 100 units/mL (25000 units/25550mL sodium chloride 0.45%)  700 Units/hr Intravenous Continuous Gerhard MunchKimberly B Hammons, RPH 7 mL/hr at 10/09/16 2356 700 Units/hr at 10/09/16 2356  . ketorolac (TORADOL) 15 MG/ML injection 15 mg  15 mg Intravenous Q6H PRN Therisa DoyneAnastassia Doutova, MD   15 mg at 10/10/16 0106  . lanthanum (FOSRENOL) chewable tablet 1,000 mg  1,000 mg Oral TID WC Therisa DoyneAnastassia Doutova, MD   1,000 mg at 10/10/16 1251  . lidocaine (XYLOCAINE) 2 %  jelly 1 application  1 application Topical Once Maryruth Bun Rama, MD      . LORazepam (ATIVAN) tablet 1 mg  1 mg Oral QHS Therisa Doyne, MD   1 mg at 10/09/16 2156  . MEDLINE mouth rinse  15 mL Mouth Rinse BID Therisa Doyne, MD      . methocarbamol (ROBAXIN) tablet 500 mg  500 mg Oral Q6H PRN Therisa Doyne, MD      . multivitamin (RENA-VIT) tablet 1 tablet  1 tablet Oral QHS Therisa Doyne, MD      . ondansetron (ZOFRAN) tablet 4 mg  4 mg Oral Q6H PRN Therisa Doyne, MD       Or  . ondansetron (ZOFRAN) injection 4 mg  4 mg Intravenous Q6H PRN Therisa Doyne, MD      . oxyCODONE (Oxy IR/ROXICODONE) immediate release tablet 30 mg  30 mg Oral Q4H PRN Therisa Doyne, MD   30 mg at 10/09/16 2150  . senna (SENOKOT) tablet 17.2 mg  2 tablet Oral QHS Therisa Doyne, MD      . sertraline (ZOLOFT) tablet 100 mg  100 mg Oral Daily Therisa Doyne, MD   100 mg at 10/10/16 0952  . sodium chloride flush (NS) 0.9 % injection 3 mL  3 mL Intravenous Q12H Therisa Doyne, MD   3 mL at 10/09/16 2158  . sodium chloride flush (NS) 0.9 % injection 3 mL  3 mL Intravenous Q12H Therisa Doyne, MD   3 mL at 10/10/16 0959  . sodium chloride flush (NS) 0.9 % injection 3 mL  3 mL Intravenous PRN Therisa Doyne, MD      . sodium thiosulfate 25 g in sodium chloride 0.9 % 200 mL Infusion for Calciphylaxis  25 g Intravenous Once Julien Nordmann, PA-C      . [START ON 10/11/2016] vancomycin (VANCOCIN) 500  mg in sodium chloride 0.9 % 100 mL IVPB  500 mg Intravenous Q M,W,F-HD Cherre Huger, [facility]       Labs: Basic Metabolic Panel:  Recent Labs Lab 10/09/16 2018 10/10/16 0403  NA 139 140  K 4.3 4.1  CL 98* 96*  CO2 28 32  GLUCOSE 92 101*  BUN 21* 25*  CREATININE 3.70* 4.12*  CALCIUM 10.0 9.2  PHOS  --  7.2*   Liver Function Tests:  Recent Labs Lab 10/09/16 2018 10/10/16 0403  AST 32 33  ALT 9* 10*  ALKPHOS 414* 446*  BILITOT 1.3* 1.1  PROT 7.3 7.4  ALBUMIN 3.0* 3.0*   CBC:  Recent Labs Lab 10/09/16 2018 10/10/16 0403  WBC 6.4 7.6  NEUTROABS 4.4  --   HGB 8.4* 8.6*  HCT 27.9* 29.6*  MCV 91.8 91.6  PLT 293 335   Cardiac Enzymes:  Recent Labs Lab 10/09/16 2018 10/10/16 0403 10/10/16 0932  TROPONINI 0.23* 0.21* 0.24*   Dialysis Orders:  MWF at [facility] 4 hours, 180 dialyzer, BFR 400, DFR 800, EDW 49.5kg, 2K/2Ca, PC - Aranesp weekly - Sodium Thiosulfate 25mg  q HD - Heparin 2000 units bolus q HD - NO VDRA  Assessment/Plan: 1.  Chest pain (with inspiration): Per primary and cardiology, volume may be contributing. 2.  Pneumonia: On Vanc/Cefepime. 3.  ESRD: Usual MWF schedule, but volume excess on exam and echo. Plan for HD today (12/19) and again tomorrow. 4.  Hypertension/volume: BP chronically low, but volume excess. UF as tolerated with HD. 5.  Anemia: Hgb 8.6. Continue Aranesp weekly. 6.  Metabolic bone disease: Ca ok,  Phos high. Continue binders. 7.  Nutrition:Alb 3, start Nepro supplements.  Ozzie Hoyle, PA-C 10/10/2016, 1:45 PM  [employer] Pager: [phone]  Renal Attending: I agree with note above.  Echo shows mod pericardial effusion with some tamponade features.  Therefore, we will dialyze today. Edmund Rick C

## 2016-10-10 NOTE — Progress Notes (Signed)
ANTICOAGULATION - Follow-up Consult  Pharmacy Consult for Heparin Indication: chest pain/ACS  Allergies  Allergen Reactions  . Aspirin Other (See Comments)    Interacts with Coreg  . Tramadol Anaphylaxis  . Vicodin [Hydrocodone-Acetaminophen] Hives  . Buprenorphine Hcl Itching    Ok with oxycodone  . Iohexol Itching  . Morphine And Related Itching and Other (See Comments)    Ok with oxycodone     Patient Measurements: Height: 5\' 3"  (160 cm) Weight: 118 lb 3.2 oz (53.6 kg) IBW/kg (Calculated) : 52.4 Heparin Dosing Weight: 53.6 kg  Vital Signs: Temp: 97.9 F (36.6 C) (12/19 1006) Temp Source: Oral (12/19 1006) BP: 99/68 (12/19 1006) Pulse Rate: 92 (12/19 1006)  Labs:  Recent Labs  10/09/16 2018 10/10/16 0403 10/10/16 0932  HGB 8.4* 8.6*  --   HCT 27.9* 29.6*  --   PLT 293 335  --   HEPARINUNFRC  --  0.33  --   CREATININE 3.70* 4.12*  --   TROPONINI 0.23* 0.21* 0.24*   Estimated Creatinine Clearance: 17.3 mL/min (by C-G formula based on SCr of 4.12 mg/dL (H)).  Assessment: 25 yo F with ESRD on HD MWF presented to [facility] ED with SOB and hyperkalemia. She was there x1 day with SOB, CP (elevated troponin), back pain, LLL pneumonia, and calciphylaxis. Transfered to [facility] and pharmacy consulted to dose heparin for ACS. Troponins remain elevated. Heparin level therapeutic (0.31) on gtt at 700 units/hr. Hgb low but stable. No bleeding noted.  Goal of Therapy:  Heparin level 0.3-0.7 units/ml Monitor platelets by anticoagulation protocol: Yes    Plan:  Continue heparin 700 units/hr  Daily heparin level and CBC Monitor for s/s bleeding   York Cerise, PharmD Pharmacy Resident  Pager [phone] 10/10/16 2:03 PM

## 2016-10-11 LAB — CBC
HCT: 28.2 % — ABNORMAL LOW (ref 36.0–46.0)
Hemoglobin: 8.5 g/dL — ABNORMAL LOW (ref 12.0–15.0)
MCH: 26.4 pg (ref 26.0–34.0)
MCHC: 30.1 g/dL (ref 30.0–36.0)
MCV: 87.6 fL (ref 78.0–100.0)
PLATELETS: 268 10*3/uL (ref 150–400)
RBC: 3.22 MIL/uL — ABNORMAL LOW (ref 3.87–5.11)
RDW: 21.7 % — AB (ref 11.5–15.5)
WBC: 7.2 10*3/uL (ref 4.0–10.5)

## 2016-10-11 LAB — RENAL FUNCTION PANEL
Albumin: 2.7 g/dL — ABNORMAL LOW (ref 3.5–5.0)
Anion gap: 17 — ABNORMAL HIGH (ref 5–15)
BUN: 12 mg/dL (ref 6–20)
CHLORIDE: 95 mmol/L — AB (ref 101–111)
CO2: 26 mmol/L (ref 22–32)
CREATININE: 2.92 mg/dL — AB (ref 0.44–1.00)
Calcium: 9.8 mg/dL (ref 8.9–10.3)
GFR calc Af Amer: 25 mL/min — ABNORMAL LOW (ref >60)
GFR calc non Af Amer: 21 mL/min — ABNORMAL LOW (ref >60)
GLUCOSE: 73 mg/dL (ref 65–99)
Phosphorus: 5.4 mg/dL — ABNORMAL HIGH (ref 2.5–4.6)
Potassium: 4 mmol/L (ref 3.5–5.1)
SODIUM: 138 mmol/L (ref 135–145)

## 2016-10-11 LAB — VANCOMYCIN, RANDOM: Vancomycin Rm: 11

## 2016-10-11 MED ORDER — OXYCODONE HCL 10 MG PO TABS: 30.0000 mg | ORAL_TABLET | ORAL | 0 refills | Status: DC | PRN

## 2016-10-11 MED ORDER — FENTANYL 100 MCG/HR TD PT72: 100.0000 ug | MEDICATED_PATCH | TRANSDERMAL | 0 refills | Status: AC

## 2016-10-11 MED ORDER — VANCOMYCIN HCL IN DEXTROSE 750-5 MG/150ML-% IV SOLN
INTRAVENOUS | Status: AC
  Administered 2016-10-11: 750 mg via INTRAVENOUS
  Filled 2016-10-11: qty 150

## 2016-10-11 MED ORDER — VANCOMYCIN HCL IN DEXTROSE 500-5 MG/100ML-% IV SOLN
INTRAVENOUS | Status: AC
  Filled 2016-10-11: qty 100

## 2016-10-11 MED ORDER — DIPHENHYDRAMINE HCL 50 MG/ML IJ SOLN
12.5000 mg | Freq: Four times a day (QID) | INTRAMUSCULAR | Status: AC | PRN
  Administered 2016-10-11 – 2016-10-12 (×3): 12.5 mg via INTRAVENOUS
  Filled 2016-10-11 (×2): qty 1

## 2016-10-11 MED ORDER — VANCOMYCIN HCL IN DEXTROSE 750-5 MG/150ML-% IV SOLN
750.0000 mg | INTRAVENOUS | Status: AC
  Administered 2016-10-11: 750 mg via INTRAVENOUS

## 2016-10-11 MED ORDER — DARBEPOETIN ALFA 100 MCG/0.5ML IJ SOSY
PREFILLED_SYRINGE | INTRAMUSCULAR | Status: AC
  Administered 2016-10-11: 100 ug via INTRAVENOUS
  Filled 2016-10-11: qty 0.5

## 2016-10-11 MED ORDER — DIPHENHYDRAMINE HCL 50 MG/ML IJ SOLN
25.0000 mg | Freq: Once | INTRAMUSCULAR | Status: AC
  Administered 2016-10-11: 25 mg via INTRAVENOUS

## 2016-10-11 MED ORDER — VANCOMYCIN HCL 500 MG IV SOLR: 500.0000 mg | INTRAVENOUS | Status: DC

## 2016-10-11 MED ORDER — DIPHENHYDRAMINE HCL 50 MG/ML IJ SOLN
INTRAMUSCULAR | Status: AC
  Administered 2016-10-11: 25 mg via INTRAVENOUS
  Filled 2016-10-11: qty 1

## 2016-10-11 NOTE — Procedures (Signed)
She has pleuritic cp, feels better on her back, hurts with deep inspirations Stable hemodynamics, I cannot hear a rub with her supine, using l fem cath. Dialysis done yesterday and today for pericarditis.  Willl make sure cardiology aware and reduce goal. [name] Small C

## 2016-10-11 NOTE — Progress Notes (Signed)
Daily Progress Note   Patient Name: [name] Small       Date: 10/11/2016 DOB: [birth year]  Age: 25 y.o. MRN#: [id number] Attending Physician: Dorothea Ogle, MD Primary Care Physician: Jeanann Lewandowsky, MD Admit Date: 10/09/2016  Reason for Consultation/Follow-up: Pain control  Subjective: Talked with [name] at bedside.  She states her pain is manageable.  She believes she may be going home 12/21.  Length of Stay: 2  Current Medications: Scheduled Meds:  . calcitonin (salmon)  1 spray Alternating Nares Daily  . ceFEPime (MAXIPIME) IV  2 g Intravenous Q M,W,F-2000  . cinacalcet  90 mg Oral Q supper  . collagenase   Topical Daily  . darbepoetin (ARANESP) injection - DIALYSIS  100 mcg Intravenous Q Wed-HD  . feeding supplement (NEPRO CARB STEADY)  237 mL Oral BID BM  . fentaNYL  100 mcg Transdermal Q72H  . gabapentin  300 mg Oral BID  . lanthanum  1,000 mg Oral TID WC  . LORazepam  1 mg Oral QHS  . mouth rinse  15 mL Mouth Rinse BID  . multivitamin  1 tablet Oral QHS  . senna  2 tablet Oral QHS  . sertraline  100 mg Oral Daily  . sodium chloride flush  3 mL Intravenous Q12H  . sodium chloride flush  3 mL Intravenous Q12H  . [START ON 10/13/2016] vancomycin  500 mg Intravenous Q M,W,F-HD    Continuous Infusions:   PRN Meds: sodium chloride, acetaminophen **OR** acetaminophen, famotidine, HYDROmorphone (DILAUDID) injection, HYDROmorphone, methocarbamol, ondansetron **OR** ondansetron (ZOFRAN) IV, oxyCODONE, sodium chloride flush  Physical Exam  Constitutional: She is oriented to person, place, and time.  Lying in bed.  Calm. NAD  HENT:  Head: Normocephalic and atraumatic.  Eyes: EOM are normal. No scleral icterus.  Neck: Normal range of motion. Neck supple.    Cardiovascular: Normal rate and regular rhythm.   Pulmonary/Chest: Effort normal. No respiratory distress.  Abdominal: Soft. Bowel sounds are normal.  Neurological: She is alert and oriented to person, place, and time.  Skin: Skin is warm.  Multiple wounds on lower extremities.  Bandages are clean and dry.  Psychiatric: She has a normal mood and affect. Thought content normal.           Vital Signs: BP 125/78   Pulse 95   Temp  98.7 F (37.1 C)   Resp 14   Ht 5\' 3"  (1.6 m)   Wt 42.8 kg (94 lb 5.7 oz)   SpO2 95%   BMI 16.71 kg/m  SpO2: SpO2: 95 % O2 Device: O2 Device: Not Delivered O2 Flow Rate: O2 Flow Rate (L/min): 3 L/min  Intake/output summary:  Intake/Output Summary (Last 24 hours) at 10/11/16 1605 Last data filed at 10/11/16 1500  Gross per 24 hour  Intake           638.63 ml  Output             4000 ml  Net         -3361.37 ml   LBM: Last BM Date: 10/10/16 Baseline Weight: Weight: 53.6 kg (118 lb 3.2 oz) Most recent weight: Weight: 42.8 kg (94 lb 5.7 oz)       Palliative Assessment/Data:    Flowsheet Rows   Flowsheet Row Most Recent Value  Intake Tab  Referral Department  Hospitalist  Unit at Time of Referral  Cardiac/Telemetry Unit  Date Notified  10/10/16  Palliative Care Type  Return patient Palliative Care  Reason for referral  Pain  Date of Admission  10/09/16  Date first seen by Palliative Care  10/10/16  # of days Palliative referral response time  0 Day(s)  # of days IP prior to Palliative referral  1  Clinical Assessment  Palliative Performance Scale Score  40%  Pain Max last 24 hours  8  Pain Min Last 24 hours  3  Anxiety Max Last 24 Hours  4  Psychosocial & Spiritual Assessment  Palliative Care Outcomes  Patient/Family meeting held?  Yes  Who was at the meeting?  patient and boyfriend  Palliative Care Outcomes  Improved pain interventions      Patient Active Problem List   Diagnosis Date Noted  . Shortness of breath   . Ulcer of  left lower extremity, limited to breakdown of skin (HCC) 10/03/2016  . Uncontrolled pain   . Severe osteopetrosis   . Adjustment disorder with mixed anxiety and depressed mood   . Fall   . Tobacco abuse   . Leukocytosis   . DNR (do not resuscitate)   . Pain of right lower extremity   . Palliative care by specialist   . Advance care planning   . Goals of care, counseling/discussion   . 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 Palliative care encounter   . Edema of right lower extremity 09/14/2016  . Leg edema, right 09/14/2016  . Cellulitis 09/13/2016  . Generalized anxiety disorder 09/05/2016  . Post-operative pain   . Labile blood pressure   . Closed nondisplaced fracture of pelvis (HCC)   . Other osteoporosis with current pathological fracture   . Recurrent major depressive disorder, in partial remission (HCC)   . Acute blood loss anemia   . 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 Anemia of chronic disease   . H/O medication noncompliance   . 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 Acute on chronic systolic CHF (congestive heart failure) (HCC) 08/16/2016  . Noncompliance of patient with renal dialysis (HCC) 08/12/2016  . 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 Osteoporosis 08/01/2016  . Thoracic compression fracture - T7, T8, T12 07/27/2016  . Acute pulmonary edema (HCC) 07/26/2016  . Essential hypertension 06/17/2016  . Depression with anxiety 06/17/2016  . Volume overload 04/14/2016  . Peripheral edema 04/14/2016  . NICM (  nonischemic cardiomyopathy) (HCC) 04/14/2016  . Internal jugular (IJ) vein thromboembolism, acute (HCC) 04/14/2016  . Pericardial effusion 04/14/2016  . Chronic anticoagulation   . Hypertensive urgency 04/11/2016  . DVT (deep venous thrombosis), left 04/11/2016  . Cellulitis of left upper extremity 03/31/2016  . Chronic pain syndrome 02/29/2016  . Chronic systolic heart failure (HCC) 01/04/2016  . AICD  (automatic cardioverter/defibrillator) present 01/04/2016  . 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 Pathologic pelvic fracture 01/04/2016  . Depression 01/04/2016  . Pain in the chest   . ESRD on dialysis (HCC)   . Tachycardia   . Elevated serum hCG   . Chest pain 10/11/2015  . Fluid overload 10/10/2015  . Hypertension 10/10/2015  . Anemia of chronic renal failure, stage 5 (HCC) 10/04/2015  . Thrombocytopenia (HCC) 10/04/2015  . Chest pain of uncertain etiology 10/04/2015  . Chronically Elevated troponin 03/03/2015  . Hyperkalemia 02/21/2015  . Acute on chronic systolic heart failure (HCC) 02/21/2012  . Pulmonary infarct (HCC) 02/07/2012  . ESRD (end stage renal disease) on dialysis (HCC) 02/03/2012  . PE (pulmonary embolism) 02/03/2012    Palliative Care Assessment & Plan   Patient Profile:  26 y.o. female  with past medical history of polycystic kidney disease on hemodialysis, CHF with an EF of 10 - 15% ICD in place, sickle cell anemia, PE, DVT, and most recently calciphylaxis wounds who was admitted on 10/09/2016 with chest pain on inspiration, hyperkalemia, volume overload and calciphylaxis pain.   PMT was called as we have seen [name] in the past to address her pain management.     Assessment: Pain controlled on current regimen.  Recommendations/Plan:  Discussed with Director Dr.  Julaine Fusi and [facility] Dr. Annie Sable.    I have written 1 month of Fentanyl TD and Oxycodone IR 30 mg tabs.  Dr. Kathrene Bongo has agreed to manage the prescription refills going forward  Continue Robaxin 500 mg q 6 hours PRN  Continue Gabapentin 300 mg BID.  An outpatient consultation with Dr. Odette Fraction for possible pain interventions is highly recommended.  [facility] up [facility] is also needed.   Discharge Planning:  Home with Home Health  Care plan was discussed with Drs. Kathrene Bongo and Phillips Odor as well as the patient  Thank you for allowing the Palliative  Medicine Team to assist in the care of this patient.   Time In: 3:30 Time Out: 4:05 Total Time 35 min Prolonged Time Billed no      Greater than 50%  of this time was spent counseling and coordinating care related to the above assessment and plan.  Algis Downs, PA-C Palliative Medicine   Please contact Palliative MedicineTeam phone at [phone] for questions and concerns.   Please see AMION for individual provider pager numbers.

## 2016-10-11 NOTE — Progress Notes (Signed)
Cancellation Note    10/11/16 1100  OT Visit Information  Last OT Received On 10/11/16  Reason Eval/Treat Not Completed Patient at procedure or test/ unavailable (Pt at HD)  [facility], OT/L  [phone] 10/11/2016

## 2016-10-11 NOTE — Progress Notes (Signed)
Patient arrived to unit by bed.  Reviewed treatment plan and this RN agrees with plan.  Report received from bedside RN, Luther Parody.  Consent verified.  Patient A & o X 4.   Lung sounds diminished to ausculation in all fields. Generalized edema. Cardiac:  NSR.  Removed caps and cleansed L femoral catheter with chlorhedxidine.  Aspirated ports of heparin and flushed them with saline per protocol.  Connected and secured lines, initiated treatment at 0936.  UF Goal of and net fluid removal 1.5L.  Will continue to monitor.

## 2016-10-11 NOTE — Progress Notes (Signed)
Dialysis treatment completed.  2000 mL ultrafiltrated.  1500 mL net fluid removal.  Patient status unchanged. Lung sounds clear to ausculation in all fields. Generalized edema. Cardiac: NSR.  Cleansed L femoral catheter with chlorhexidine.  Disconnected lines and flushed ports with saline per protocol.  Ports locked with heparin and capped per protocol.    Report given to bedside, RN Luther Parody.

## 2016-10-11 NOTE — Progress Notes (Signed)
PT Cancellation Note  Patient Details Name: Nacy Bath MRN: [id number] DOB: [birth year]   Cancelled Treatment:    Reason Eval/Treat Not Completed: Patient at procedure or test/unavailable.  Pt just left for HD.  Will see later or 12/21 as able. 10/11/2016   Bing, PT [phone]  (pager)   Eliseo Gum Tura Roller 10/11/2016, 11:55 AM

## 2016-10-11 NOTE — Progress Notes (Signed)
Progress Note    [name] Small  LXB:[id number] DOB: December 10, 1990  DOA: 10/09/2016   PCP: Jeanann Lewandowsky, MD   Brief Narrative:   25 y.o. 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  Assessment/Plan:   Principal problems:   Chest pain/ischemic cardiomyopathy/chronic systolic CHF - evaluated by cardiologist - EF of 20% s/p AICD placement, recent ECHO no wall motion abnormality, recent nuclear stress test neg for ischemia - EKG with chronic T wave inversion in the anterolateral leads and unchanged from prior EKG's - troponin is elevated with flat trend not consistent with ACS - no further cardiac work up indicated   Volume overload and hyperkalemia - VQ scan done 10/09/16 which was low probability for pulmonary embolism  - Dialyzed 10/09/16, appreciate nephrology team following   Active Problems:   Anemia of chronic renal failure, stage 5 (HCC)/Hyperparathyroidism due to ESRD on dialysis (HCC) - no signs of bleeding - Continue Aranesp.    ESRD on dialysis [facility]) - Status post kidney transplant with failure - recurrent hospital admissions for volume overload secondary to noncompliance with hemodialysis (7 hospital admissions in the last 6 months) - HD/medications per  nephrology.    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 Not a good candidate for blood thinners given history of noncompliance    Calciphylaxis/infected wounds/ Ulcer of left lower extremity, limited to breakdown of skin (HCC) - Has a history of painful wounds/ulcers in her legs. She is on high doses of opiates for pain management.  - Continue Cefepime/Vancomycin. Continue wound care per recommendations    Tobacco abuse - Counseled  Family Communication/Anticipated D/C date and plan/Code Status   DVT prophylaxis: Heparin SQ Code Status: DO NOT RESUSCITATE.  Family Communication: No family at the bedside. Disposition Plan: Home in 1-2 am.   Medical Consultants:    Nephrology  Cardiology  Palliative Care  Procedures:    None  Anti-Infectives:    Cefepime and vancomycin 10/09/16--->  Subjective:   The patient reports that she is having persistent pain in her LE's, 7/10 this AM.  Objective:    Vitals:   10/11/16 1130 10/11/16 1200 10/11/16 1230 10/11/16 1300  BP: 112/61 (!) 106/56 127/66 123/76  Pulse: (!) 105 98 (!) 102 96  Resp:      Temp:      TempSrc:      SpO2:      Weight:      Height:        Intake/Output Summary (Last 24 hours) at 10/11/16 1334 Last data filed at 10/11/16 1017  Gross per 24 hour  Intake           398.63 ml  Output  2500 ml  Net         -2101.37 ml   Filed Weights   10/09/16 1946 10/11/16 0930  Weight: 53.6 kg (118 lb 3.2 oz) 44.3 kg (97 lb 10.6 oz)    Exam: General exam: Appears Anxious. Respiratory system: A few scattered rhonchi on the right with increased work of breathing. Cardiovascular system: S1 & S2 heard, RRR. No JVD,  rubs, gallops or clicks. No  murmurs. Gastrointestinal system: Abdomen is nondistended, soft and nontender. No organomegaly or masses felt.  Central nervous system: Alert and oriented. No focal neurological deficits. Extremities: Multiple areas of eschar and skin breakdown. Skin: Skin breakdown to the lower extremities with eschar, most notable in the right anterior thigh with a deep ulcer draining, pictured below. Psychiatry: Judgement and insight appear impaired.     Data Reviewed:   I have personally reviewed following labs and imaging studies:  Labs: Basic Metabolic Panel:  Recent Labs Lab 10/09/16 2018 10/10/16 0403 10/11/16 0522  NA 139 140 138  K 4.3 4.1 4.0  CL 98* 96* 95*  CO2 28 32 26  GLUCOSE 92 101* 73  BUN 21* 25* 12  CREATININE 3.70* 4.12* 2.92*  CALCIUM 10.0 9.2 9.8  MG  --  2.1  --   PHOS  --  7.2* 5.4*   Liver Function Tests:  Recent Labs Lab 10/09/16 2018 10/10/16 0403 10/11/16 0522  AST 32 33  --   ALT 9* 10*  --   ALKPHOS 414* 446*  --   BILITOT 1.3* 1.1  --   PROT 7.3 7.4  --   ALBUMIN 3.0* 3.0* 2.7*    CBC:  Recent Labs Lab 10/09/16 2018 10/10/16 0403 10/11/16 0522  WBC 6.4 7.6 7.2  NEUTROABS 4.4  --   --   HGB 8.4* 8.6* 8.5*  HCT 27.9* 29.6* 28.2*  MCV 91.8 91.6 87.6  PLT 293 335 268   Cardiac Enzymes:  Recent Labs Lab 10/09/16 2018 10/10/16 0403 10/10/16 0932  TROPONINI 0.23* 0.21* 0.24*   Thyroid function studies:  Recent Labs  10/10/16 0403  TSH 3.424   Microbiology Recent Results (from the past 240 hour(s))  Culture, blood (routine x 2) Call MD if unable to obtain prior to antibiotics being given     Status: None (Preliminary result)   Collection Time: 10/09/16 10:10 PM  Result Value Ref Range Status   Specimen Description BLOOD LEFT ANTECUBITAL  Final   Special Requests IN PEDIATRIC BOTTLE 4CC  Final   Culture NO GROWTH 2 DAYS  Final   Report Status PENDING  Incomplete  Culture, blood (routine x 2) Call MD if unable to obtain prior  to antibiotics being given     Status: None (Preliminary result)   Collection Time: 10/09/16 10:11 PM  Result Value Ref Range Status   Specimen Description BLOOD RIGHT ANTECUBITAL  Final   Special Requests IN PEDIATRIC BOTTLE 4CC  Final   Culture NO GROWTH 2 DAYS  Final   Report Status PENDING  Incomplete  MRSA PCR Screening     Status: None   Collection Time: 10/09/16 10:30 PM  Result Value Ref Range Status   MRSA by PCR NEGATIVE NEGATIVE Final    Comment:        The GeneXpert MRSA Assay (FDA approved for NASAL specimens only), is one component of a comprehensive MRSA colonization surveillance program. It is not intended to diagnose MRSA infection nor to guide or monitor treatment for MRSA infections.  Radiology: No results found.  Medications:   . calcitonin (salmon)  1 spray Alternating Nares Daily  . ceFEPime (MAXIPIME) IV  2 g Intravenous Q M,W,F-2000  . cinacalcet  90 mg Oral Q supper  . collagenase   Topical Daily  . darbepoetin (ARANESP) injection - DIALYSIS  100 mcg Intravenous Q Wed-HD  . feeding supplement (NEPRO CARB STEADY)  237 mL Oral BID BM  . fentaNYL  100 mcg Transdermal Q72H  . gabapentin  300 mg Oral BID  . lanthanum  1,000 mg Oral TID WC  . LORazepam  1 mg Oral QHS  . mouth rinse  15 mL Mouth Rinse BID  . multivitamin  1 tablet Oral QHS  . senna  2 tablet Oral QHS  . sertraline  100 mg Oral Daily  . sodium chloride flush  3 mL Intravenous Q12H  . sodium chloride flush  3 mL Intravenous Q12H  . [START ON 10/13/2016] vancomycin  500 mg Intravenous Q M,W,F-HD   Continuous Infusions:   LOS: 2 days   MAGICK-Koua Deeg, [facility]  [facility]  Pager [phone]. If unable to reach me by pager, please call my cell phone at [phone]  Please refer to [email or web address], password [email or web address] to get updated schedule on who will round on this patient, as hospitalists switch teams weekly.  If 7PM-7AM, please contact night-coverage at [email or web address], password [email or web address] for  any overnight needs.  10/11/2016, 1:34 PM

## 2016-10-12 ENCOUNTER — Encounter: Payer: Self-pay | Admitting: Cardiology

## 2016-10-12 LAB — CBC
HCT: 29.3 % — ABNORMAL LOW (ref 36.0–46.0)
Hemoglobin: 9.1 g/dL — ABNORMAL LOW (ref 12.0–15.0)
MCH: 26.8 pg (ref 26.0–34.0)
MCHC: 31.1 g/dL (ref 30.0–36.0)
MCV: 86.4 fL (ref 78.0–100.0)
PLATELETS: 312 10*3/uL (ref 150–400)
RBC: 3.39 MIL/uL — ABNORMAL LOW (ref 3.87–5.11)
RDW: 21.7 % — ABNORMAL HIGH (ref 11.5–15.5)
WBC: 7.3 10*3/uL (ref 4.0–10.5)

## 2016-10-12 LAB — RENAL FUNCTION PANEL
Albumin: 2.8 g/dL — ABNORMAL LOW (ref 3.5–5.0)
Anion gap: 22 — ABNORMAL HIGH (ref 5–15)
BUN: 9 mg/dL (ref 6–20)
CALCIUM: 9 mg/dL (ref 8.9–10.3)
CO2: 21 mmol/L — AB (ref 22–32)
CREATININE: 2.52 mg/dL — AB (ref 0.44–1.00)
Chloride: 97 mmol/L — ABNORMAL LOW (ref 101–111)
GFR calc Af Amer: 29 mL/min — ABNORMAL LOW (ref >60)
GFR calc non Af Amer: 25 mL/min — ABNORMAL LOW (ref >60)
GLUCOSE: 61 mg/dL — AB (ref 65–99)
Phosphorus: 4.4 mg/dL (ref 2.5–4.6)
Potassium: 3.5 mmol/L (ref 3.5–5.1)
SODIUM: 140 mmol/L (ref 135–145)

## 2016-10-12 MED ORDER — CARVEDILOL 3.125 MG PO TABS: 3.1250 mg | ORAL_TABLET | Freq: Two times a day (BID) | ORAL | Status: AC

## 2016-10-12 MED ORDER — VANCOMYCIN HCL 500 MG IV SOLR: 500.0000 mg | INTRAVENOUS | 0 refills | Status: DC

## 2016-10-12 MED ORDER — HYDROMORPHONE HCL 1 MG/ML IJ SOLN
1.0000 mg | Freq: Once | INTRAMUSCULAR | Status: AC
  Administered 2016-10-12: 1 mg via INTRAVENOUS
  Filled 2016-10-12: qty 1

## 2016-10-12 MED ORDER — IBUPROFEN 600 MG PO TABS
600.0000 mg | ORAL_TABLET | Freq: Three times a day (TID) | ORAL | Status: DC
  Administered 2016-10-12: 600 mg via ORAL
  Filled 2016-10-12: qty 1

## 2016-10-12 MED ORDER — DEXTROSE 5 % IV SOLN: 2.0000 g | INTRAVENOUS | 0 refills | Status: DC

## 2016-10-12 MED ORDER — DIPHENHYDRAMINE HCL 50 MG/ML IJ SOLN
INTRAMUSCULAR | Status: AC
  Administered 2016-10-12: 12.5 mg via INTRAVENOUS
  Filled 2016-10-12: qty 1

## 2016-10-12 MED ORDER — COLCHICINE 0.6 MG PO TABS: 0.6000 mg | ORAL_TABLET | Freq: Every day | ORAL | 0 refills | Status: AC

## 2016-10-12 MED ORDER — IBUPROFEN 200 MG PO TABS: 200.0000 mg | ORAL_TABLET | Freq: Three times a day (TID) | ORAL | 0 refills | Status: DC

## 2016-10-12 MED ORDER — LIDOCAINE 5 % EX PTCH: 1.0000 | MEDICATED_PATCH | CUTANEOUS | 0 refills | Status: AC

## 2016-10-12 MED ORDER — COLCHICINE 0.6 MG PO TABS
0.6000 mg | ORAL_TABLET | Freq: Every day | ORAL | Status: DC
  Administered 2016-10-12: 0.6 mg via ORAL
  Filled 2016-10-12: qty 1

## 2016-10-12 MED ORDER — CINACALCET HCL 30 MG PO TABS: 90.0000 mg | ORAL_TABLET | Freq: Every day | ORAL | 0 refills | Status: AC

## 2016-10-12 NOTE — Progress Notes (Signed)
Patient has refused dialysis for today, says she has had it three days in a row and plans to go to her outpatient center tomorrow.

## 2016-10-12 NOTE — Evaluation (Addendum)
Occupational Therapy Evaluation Patient Details Name: [name] Small MRN: [id number] DOB: 1991-10-22 Today's Date: 10/12/2016    History of Present Illness 25 y.o. female  with past medical history of polycystic kidney disease on hemodialysis, T spine fractures, CHF with an EF of 10 - 15% ICD in place, sickle cell anemia, PE, DVT, and most recently calciphylaxis wounds who was admitted on 10/09/2016 with chest pain on inspiration, hyperkalemia, volume overload and calciphylaxis pain.   Clinical Impression   Pt with decline in function and safety with ADLs and ADL mobility with decreased strength, balance and endurance. Pt is limited by R LE wound area pain and unable to complete sit - stand x 2 attempts from EOB for [facility] transfer.Pt without SOB. Per pt report she  Will have continued assist from boyfriend and family at home. Pt would benefit from acute OT services to increase level of function and safety    Follow Up Recommendations  Home health OT    Equipment Recommendations  Other (comment) Lexicographer)    Recommendations for Other Services PT consult     Precautions / Restrictions Precautions Precautions: Fall;Back (T spine fractures recently, has back brace) Precaution Comments: Patient able to state precautions. Required Braces or Orthoses: Spinal Brace Spinal Brace: Thoracolumbosacral orthotic Restrictions Weight Bearing Restrictions: No      Mobility Bed Mobility Overal bed mobility: Needs Assistance       Supine to sit: Supervision;HOB elevated Sit to supine: Min assist   General bed mobility comments: pt required increased time and min A with LEs back onto bed  Transfers Overall transfer level: Needs assistance   Transfers: Sit to/from Stand (pt attempted sit - stand from EOB, however did not complete due to pain in R LE) Sit to Stand:  (pt attempted sit - stand from EOB, however did not complete due to pain in R LE)         General transfer comment: pt  attempted sit - stand from EOB, however did not complete due to pain in R LE    Balance     Sitting balance-Leahy Scale: Fair       Standing balance-Leahy Scale: Zero                              ADL Overall ADL's : Needs assistance/impaired     Grooming: Wash/dry hands;Wash/dry face;Set up;Supervision/safety;Sitting   Upper Body Bathing: Set up;Supervision/ safety;Sitting   Lower Body Bathing: Moderate assistance (simulated) Lower Body Bathing Details (indicate cue type and reason): pt already recieving assist at home since spinal fractures Upper Body Dressing : Supervision/safety;Set up;Sitting   Lower Body Dressing: Moderate assistance (simulated) Lower Body Dressing Details (indicate cue type and reason): pt already recieving assist at home since spinal fractures   Toilet Transfer Details (indicate cue type and reason): NT, pt attempted sit - stand from bed to for [facility] transfer, however pt reports increased pain in R LE and declined any further attempt Toileting- Clothing Manipulation and Hygiene: Minimal assistance;Sitting/lateral lean         General ADL Comments: Pt limited by R LE wound pain     Vision Vision Assessment?: No apparent visual deficits              Pertinent Vitals/Pain Pain Assessment: 0-10 Pain Score: 8  Pain Location: R LE wound area Pain Descriptors / Indicators: Burning;Grimacing;Guarding Pain Intervention(s): Limited activity within patient's tolerance;Monitored during session;Premedicated before session;Repositioned  Hand Dominance Right   Extremity/Trunk Assessment Upper Extremity Assessment Upper Extremity Assessment: Generalized weakness   Lower Extremity Assessment Lower Extremity Assessment: Defer to PT evaluation       Communication Communication Communication: No difficulties   Cognition Arousal/Alertness: Awake/alert Behavior During Therapy: WFL for tasks assessed/performed Overall Cognitive Status:  Within Functional Limits for tasks assessed                     General Comments   pt pleasant and cooperative                 Home Living Family/patient expects to be discharged to:: Private residence Living Arrangements: Alone Available Help at Discharge: Friend(s);Family;Available 24 hours/day (boyfriend per pt report) Type of Home: Apartment Home Access: Level entry     Home Layout: One level     Bathroom Shower/Tub: Tub/shower unit;Curtain   FirefighterBathroom Toilet: Standard     Home Equipment: Bedside commode;Walker - 4 wheels   Additional Comments: Pt sponge bathing since she has a femoreal dialysis catheter      Prior Functioning/Environment Level of Independence: Independent with assistive device(s);Needs assistance  Gait / Transfers Assistance Needed: pt reports that she uses 4 wheeled RW ADL's / Homemaking Assistance Needed: Since fall and spinal fx, assist needed with LB ADLs from boyfriend. Not able to perform IADLs.            OT Problem List: Decreased strength;Decreased activity tolerance;Impaired balance (sitting and/or standing);Pain   OT Treatment/Interventions: Self-care/ADL training;DME and/or AE instruction;Therapeutic activities;Therapeutic exercise;Patient/family education    OT Goals(Current goals can be found in the care plan section) Acute Rehab OT Goals Patient Stated Goal: go home today OT Goal Formulation: With patient Time For Goal Achievement: 10/19/16 Potential to Achieve Goals: Good ADL Goals Pt Will Perform Lower Body Bathing: with min assist;with caregiver independent in assisting;with adaptive equipment Pt Will Perform Lower Body Dressing: with min assist;with caregiver independent in assisting;with adaptive equipment Pt Will Transfer to Toilet: with min assist;bedside commode Pt Will Perform Toileting - Clothing Manipulation and hygiene: sitting/lateral leans;with min assist;sit to/from stand  OT Frequency: Min 2X/week    Barriers to D/C:    no barriers, pt reports that family and boyfriend will continue assisting her at home                     End of Session Equipment Utilized During Treatment: Gait belt  Activity Tolerance: Patient limited by pain Patient left: in bed;with call bell/phone within reach   Time: [id number] OT Time Calculation (min): 17 min Charges:  OT General Charges $OT Visit: 1 Procedure OT Evaluation $OT Eval Moderate Complexity: 1 Procedure G-Codes:    Galen ManilaSpencer, [place] Silveria Jeanette 10/12/2016, 11:22 AM

## 2016-10-12 NOTE — Discharge Summary (Addendum)
Physician Discharge Summary  [name] Small ZOX:[id number] DOB: 01/20/1991 DOA: 10/09/2016  PCP: Jeanann Lewandowsky, MD  Admit date: 10/09/2016 Discharge date: 10/12/2016  Recommendations for Outpatient Follow-up:  1. Pt will need to follow up with PCP in 1-2 weeks post discharge 2. Please also check CBC to evaluate Hg and Hct levels 3. Pt to receive maxipime and vancomycin with next HD sessions 12/22, 12/25, 12/27 and then stop, this will complete 5 days therapy 4. Please note that narcotic scripts provided by PCT and nephrology team to continue  5. Dose of Coreg lowered from 6.25 to 3.125 due to bradycardia and hypotension  6. Per cardiology, pt to continue taking colchicine and ibuprofen for pericarditis   Discharge Diagnoses:  Active Problems:   Hyperkalemia   Anemia of chronic renal failure, stage 5 (HCC)   Hypertension   ESRD on dialysis (HCC)   Pain in the chest   Chronic systolic heart failure (HCC)   Chronic pain syndrome   Volume overload   NICM (nonischemic cardiomyopathy) (HCC)   Essential hypertension   Thoracic compression fracture - T7, T8, T12   Hyperparathyroidism due to end stage renal disease on dialysis (HCC)   Chronic deep vein thrombosis (DVT) of femoral vein (HCC)   Calciphylaxis   Tobacco abuse   Ulcer of left lower extremity, limited to breakdown of skin (HCC)   Shortness of breath  Discharge Condition: Stable  Diet recommendation: Renal diet  Brief Narrative:   25 y.o. 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  Assessment/Plan:   Principal problems:   Chest pain/ischemic cardiomyopathy/chronic systolic CHF, pericarditis  - evaluated by cardiologist - EF of 20% s/p AICD placement, recent ECHO no wall motion abnormality, recent nuclear stress test neg for ischemia - EKG with chronic T wave inversion in the anterolateral leads and unchanged from prior EKG's - troponin is elevated with flat trend not consistent with ACS - no further cardiac work up indicated  - per cardiology, moderate pericardial effusion, with some tamponade features on Echo. Patient is hemodynamically stable, therefore there are no concerns about tamponade. Per Dr. Allyson Sabal, treat for pericarditis with colchicine and NSAIDS as the patient is still having pleuritic chest pain - pt says ibuprofen gives her stomach discomfort so will lower the dose from 600 to 200 mg and pt f/u closely   Volume overload and hyperkalemia - VQ scan done 10/09/16 which was low probability for pulmonary embolism  - tolerating HD inpatient with no problems   Active Problems:   Anemia of chronic renal failure, stage 5 (HCC)/Hyperparathyroidism due to ESRD on dialysis [facility]) - no signs of bleeding - Continue Aranesp.    ESRD on dialysis [facility]) - Status post kidney transplant with failure - recurrent hospital admissions for volume overload secondary to noncompliance with hemodialysis (7 hospital admissions in the last 6 months) - HD/medications per nephrology.    Chronic pain syndrome/narcotic dependence/Thoracic compression fracture - T7, T8, T12 -  PCT consulted for assistance, appreciate help, scripts provided by PCT and nephrologist agreed to provide refill      Chronic deep vein  thrombosis (DVT) of femoral vein (HCC) - Status post IVC filter. Not a good candidate for blood thinners given history of noncompliance    Calciphylaxis/infected wounds/ Ulcer of left lower extremity, limited to breakdown of skin (HCC) - Has a history of painful wounds/ulcers in her legs. She is on high doses of opiates for pain management.  - Continue Cefepime/Vancomycin. Continue wound care per recommendations    Tobacco abuse - Counseled  Family Communication/Anticipated D/C date and plan/Code Status   DVT prophylaxis: Heparin SQ Code Status: DO NOT RESUSCITATE.  Family Communication: No family at the bedside. Disposition Plan: Home   Medical Consultants:    Nephrology  Cardiology  Palliative Care  Procedures:    None  Anti-Infectives:    Cefepime and vancomycin 10/09/16--->  Procedures/Studies: Ct Extrem Lower Wo Cm Bil  Result Date: 09/13/2016 CLINICAL DATA:  Acute onset of right leg swelling and large blister along the right lower leg. Right foot pain. Initial encounter. EXAM: CT OF THE LOWER BILATERAL EXTREMITY WITHOUT CONTRAST TECHNIQUE: Multidetector CT imaging of the lower bilateral extremity was performed according to the standard protocol. COMPARISON:  Left lower extremity venous Doppler ultrasound performed 01/25/2015 FINDINGS: Bones/Joint/Cartilage There is no evidence of fracture or dislocation. The visualized osseous structures appear intact. There is marked chronic deformity of the right pubic rami and right side of the pubic symphysis, with heterotopic bone formation. No definite focal osseous erosions are seen. The cartilage is not well assessed on CT. Ligaments Suboptimally assessed by CT. Muscles and Tendons The visualized musculature is grossly unremarkable. The tendon structures are unremarkable in appearance. No intramuscular hematoma is seen. Soft tissues There is diffuse soft tissue edema involving the right hip and entirety of the right  lower extremity, with a large 5.4 cm subcutaneous fluid-filled blister along the anterolateral right lower leg. Multiple smaller blisters are seen along the medial right thigh. The vasculature is not well assessed without contrast. Diffuse arterial calcification is noted throughout both lower extremities, with a vascular graft noted at the anterior right thigh. Small bilateral knee joint effusions are noted, slightly larger on the right. No abnormal focal fluid collections are seen to suggest abscess. IMPRESSION: 1. No evidence of fracture or dislocation. 2. Diffuse soft tissue edema involving the right hip and entirety of the right lower extremity, with large 5.4 cm fluid-filled blister along the anterolateral right lower leg. Multiple smaller blisters along the medial right thigh. No evidence of abscess. 3. Diffuse vascular calcifications seen. Vasculature not well assessed without contrast. 4. Small bilateral knee joint effusions, slightly larger on the right. Electronically Signed   By: Roanna Raider M.D.   On: 09/13/2016 23:30     Discharge Exam: Vitals:   10/12/16 0452 10/12/16 0902  BP: 119/88 108/73  Pulse: 95 93  Resp: 18 17  Temp: 98.2 F (36.8 C) 98 F (36.7 C)   Vitals:   10/11/16 1613 10/11/16 2146 10/12/16 0452 10/12/16 0902  BP: 119/74 114/62 119/88 108/73  Pulse: (!) 46 96 95 93  Resp: 18 18 18 17   Temp: 98.9 F (37.2 C) 98.7 F (37.1 C) 98.2 F (36.8 C) 98 F (36.7 C)  TempSrc: Oral Oral Oral Oral  SpO2: 100% 95% 98% 96%  Weight:  43.2 kg (95 lb 3.8 oz)    Height:        General: Pt  is alert, follows commands appropriately, not in acute distress Cardiovascular: Regular rate and rhythm, S1/S2 +, no rubs, no gallops Respiratory: Clear to auscultation bilaterally, no wheezing, no crackles, no rhonchi Abdominal: Soft, non tender, non distended, bowel sounds +, no guarding  Discharge Instructions  Discharge Instructions    Diet - low sodium heart healthy    Complete  by:  As directed    Increase activity slowly    Complete by:  As directed      Allergies as of 10/12/2016      Reactions   Aspirin Other (See Comments)   Interacts with Coreg   Tramadol Anaphylaxis   Vicodin [hydrocodone-acetaminophen] Hives   Buprenorphine Hcl Itching   Ok with oxycodone   Iohexol Itching   Morphine And Related Itching, Other (See Comments)   Ok with oxycodone      Medication List    STOP taking these medications   calcitRIOL 0.5 MCG capsule Commonly known as:  ROCALTROL     TAKE these medications   calcitonin (salmon) 200 UNIT/ACT nasal spray Commonly known as:  MIACALCIN Place 1 spray into alternate nostrils daily.   carvedilol 3.125 MG tablet Commonly known as:  COREG Take 1 tablet (3.125 mg total) by mouth 2 (two) times daily. What changed:  medication strength  how much to take   ceFEPIme 2 g in dextrose 5 % 50 mL Inject 2 g into the vein every Monday, Wednesday, and Friday at 8 PM. Give with next HD on 12/22, 12/25, 12/27 and then stop Start taking on:  10/13/2016   cinacalcet 30 MG tablet Commonly known as:  SENSIPAR Take 3 tablets (90 mg total) by mouth daily with supper. What changed:  how much to take   colchicine 0.6 MG tablet Take 1 tablet (0.6 mg total) by mouth daily.   famotidine 20 MG tablet Commonly known as:  PEPCID Take 1 tablet (20 mg total) by mouth 2 (two) times daily as needed for heartburn or indigestion.   fentaNYL 100 MCG/HR Commonly known as:  DURAGESIC - dosed mcg/hr Place 1 patch (100 mcg total) onto the skin every 3 (three) days.   gabapentin 100 MG capsule Commonly known as:  NEURONTIN Take 3 capsules (300 mg total) by mouth at bedtime. What changed:  how much to take  when to take this  additional instructions   ibuprofen 200 MG tablet Commonly known as:  ADVIL,MOTRIN Take 1 tablet (200 mg total) by mouth 3 (three) times daily.   lanthanum 1000 MG chewable tablet Commonly known as:   FOSRENOL Chew 1 tablet (1,000 mg total) by mouth 3 (three) times daily with meals.   lidocaine 5 % Commonly known as:  LIDODERM Place 1 patch onto the skin daily. Remove & Discard patch within 12 hours or as directed by MD   LORazepam 1 MG tablet Commonly known as:  ATIVAN Take 1 tablet (1 mg total) by mouth at bedtime. What changed:  when to take this   methocarbamol 500 MG tablet Commonly known as:  ROBAXIN Take 1 tablet (500 mg total) by mouth every 6 (six) hours as needed for muscle spasms.   multivitamin Tabs tablet Take 1 tablet by mouth daily.   Oxycodone HCl 10 MG Tabs Take 3 tablets (30 mg total) by mouth every 4 (four) hours as needed.   sertraline 100 MG tablet Commonly known as:  ZOLOFT Take 1 tablet (100 mg total) by mouth daily.   vancomycin 500 mg in sodium chloride 0.9 %  100 mL Inject 500 mg into the vein every Monday, Wednesday, and Friday with hemodialysis. Give with next HD 12/22, 12/25, 12/27 and then stop Start taking on:  10/13/2016       Follow-up Information    JEGEDE, Keane Scrape, MD Follow up.   Specialty:  Internal Medicine Contact information: [address] [place] [address] [phone]            The results of significant diagnostics from this hospitalization (including imaging, microbiology, ancillary and laboratory) are listed below for reference.     Microbiology: Recent Results (from the past 240 hour(s))  Culture, blood (routine x 2) Call MD if unable to obtain prior to antibiotics being given     Status: None (Preliminary result)   Collection Time: 10/09/16 10:10 PM  Result Value Ref Range Status   Specimen Description BLOOD LEFT ANTECUBITAL  Final   Special Requests IN PEDIATRIC BOTTLE 4CC  Final   Culture NO GROWTH 2 DAYS  Final   Report Status PENDING  Incomplete  Culture, blood (routine x 2) Call MD if unable to obtain prior to antibiotics being given     Status: None (Preliminary result)   Collection Time:  10/09/16 10:11 PM  Result Value Ref Range Status   Specimen Description BLOOD RIGHT ANTECUBITAL  Final   Special Requests IN PEDIATRIC BOTTLE 4CC  Final   Culture NO GROWTH 2 DAYS  Final   Report Status PENDING  Incomplete  MRSA PCR Screening     Status: None   Collection Time: 10/09/16 10:30 PM  Result Value Ref Range Status   MRSA by PCR NEGATIVE NEGATIVE Final    Comment:        The GeneXpert MRSA Assay (FDA approved for NASAL specimens only), is one component of a comprehensive MRSA colonization surveillance program. It is not intended to diagnose MRSA infection nor to guide or monitor treatment for MRSA infections.      Labs: Basic Metabolic Panel:  Recent Labs Lab 10/09/16 2018 10/10/16 0403 10/11/16 0522 10/12/16 0608  NA 139 140 138 140  K 4.3 4.1 4.0 3.5  CL 98* 96* 95* 97*  CO2 28 32 26 21*  GLUCOSE 92 101* 73 61*  BUN 21* 25* 12 9  CREATININE 3.70* 4.12* 2.92* 2.52*  CALCIUM 10.0 9.2 9.8 9.0  MG  --  2.1  --   --   PHOS  --  7.2* 5.4* 4.4   Liver Function Tests:  Recent Labs Lab 10/09/16 2018 10/10/16 0403 10/11/16 0522 10/12/16 0608  AST 32 33  --   --   ALT 9* 10*  --   --   ALKPHOS 414* 446*  --   --   BILITOT 1.3* 1.1  --   --   PROT 7.3 7.4  --   --   ALBUMIN 3.0* 3.0* 2.7* 2.8*   No results for input(s): LIPASE, AMYLASE in the last 168 hours. No results for input(s): AMMONIA in the last 168 hours. CBC:  Recent Labs Lab 10/09/16 2018 10/10/16 0403 10/11/16 0522 10/12/16 0608  WBC 6.4 7.6 7.2 7.3  NEUTROABS 4.4  --   --   --   HGB 8.4* 8.6* 8.5* 9.1*  HCT 27.9* 29.6* 28.2* 29.3*  MCV 91.8 91.6 87.6 86.4  PLT 293 335 268 312   Cardiac Enzymes:  Recent Labs Lab 10/09/16 2018 10/10/16 0403 10/10/16 0932  TROPONINI 0.23* 0.21* 0.24*   BNP: BNP (last 3 results)  Recent Labs  04/11/16 1130 07/26/16 1803  BNP >4,500.0* >4,500.0*      SIGNED: Time coordinating discharge: 30 minutes  MAGICK-MYERS, ISKRA,  MD  [facility] 10/12/2016, 11:09 AM Pager [phone]  If 7PM-7AM, please contact night-coverage [email or web address] Password [email or web address]

## 2016-10-12 NOTE — Progress Notes (Signed)
Spoke with Ozzie Hoyle, PA regarding Ms. Sites's Echo. There is a moderate pericardial effusion, with some tamponade features on Echo. Patient is hemodynamically stable, therefore there are no concerns about tamponade. Spoke to Dr. Allyson Sabal, will treat for pericarditis with colchicine and NSAIDS as the patient is still having pleuritic chest pain.    Runell Gess, M.D., FACP, [facility], [facility] [address] [place], [place]  [address]  [phone] 10/13/2016 1:44 PM

## 2016-10-12 NOTE — Progress Notes (Signed)
[facility] Progress Note   Subjective:  Seen in room. Still with pleuritic substernal chest pains, little better today. She denies dyspnea today. Still having the chronic leg and back pain. Spoke to cardiology NP today about the last ECHO, specifically does she have pericarditis with tamponade features? They will comment on this and treat as necessary.   Objective Vitals:   10/11/16 1613 10/11/16 2146 10/12/16 0452 10/12/16 0902  BP: 119/74 114/62 119/88 108/73  Pulse: (!) 46 96 95 93  Resp: 18 18 18 17   Temp: 98.9 F (37.2 C) 98.7 F (37.1 C) 98.2 F (36.8 C) 98 F (36.7 C)  TempSrc: Oral Oral Oral Oral  SpO2: 100% 95% 98% 96%  Weight:  43.2 kg (95 lb 3.8 oz)    Height:       Physical Exam General: Frail female, NAD. In chest brace. Heart: RRR; no murmur or rub. Lungs: Unable to fully assess due to brace; CTA in upper lobes. Extremities: No LE edema (bandaged areas) Dialysis Access: L femoral PC  Additional Objective Labs: Basic Metabolic Panel:  Recent Labs Lab 10/10/16 0403 10/11/16 0522 10/12/16 0608  NA 140 138 140  K 4.1 4.0 3.5  CL 96* 95* 97*  CO2 32 26 21*  GLUCOSE 101* 73 61*  BUN 25* 12 9  CREATININE 4.12* 2.92* 2.52*  CALCIUM 9.2 9.8 9.0  PHOS 7.2* 5.4* 4.4   Liver Function Tests:  Recent Labs Lab 10/09/16 2018 10/10/16 0403 10/11/16 0522 10/12/16 0608  AST 32 33  --   --   ALT 9* 10*  --   --   ALKPHOS 414* 446*  --   --   BILITOT 1.3* 1.1  --   --   PROT 7.3 7.4  --   --   ALBUMIN 3.0* 3.0* 2.7* 2.8*   CBC:  Recent Labs Lab 10/09/16 2018 10/10/16 0403 10/11/16 0522 10/12/16 0608  WBC 6.4 7.6 7.2 7.3  NEUTROABS 4.4  --   --   --   HGB 8.4* 8.6* 8.5* 9.1*  HCT 27.9* 29.6* 28.2* 29.3*  MCV 91.8 91.6 87.6 86.4  PLT 293 335 268 312   Blood Culture    Component Value Date/Time   SDES BLOOD RIGHT ANTECUBITAL 10/09/2016 2211   SPECREQUEST IN PEDIATRIC BOTTLE 4CC 10/09/2016 2211   CULT NO GROWTH 2 DAYS 10/09/2016  2211   REPTSTATUS PENDING 10/09/2016 2211   Cardiac Enzymes:  Recent Labs Lab 10/09/16 2018 10/10/16 0403 10/10/16 0932  TROPONINI 0.23* 0.21* 0.24*   Medications:  . calcitonin (salmon)  1 spray Alternating Nares Daily  . ceFEPime (MAXIPIME) IV  2 g Intravenous Q M,W,F-2000  . cinacalcet  90 mg Oral Q supper  . collagenase   Topical Daily  . darbepoetin (ARANESP) injection - DIALYSIS  100 mcg Intravenous Q Wed-HD  . feeding supplement (NEPRO CARB STEADY)  237 mL Oral BID BM  . fentaNYL  100 mcg Transdermal Q72H  . gabapentin  300 mg Oral BID  . lanthanum  1,000 mg Oral TID WC  . LORazepam  1 mg Oral QHS  . mouth rinse  15 mL Mouth Rinse BID  . multivitamin  1 tablet Oral QHS  . senna  2 tablet Oral QHS  . sertraline  100 mg Oral Daily  . sodium chloride flush  3 mL Intravenous Q12H  . sodium chloride flush  3 mL Intravenous Q12H  . [START ON 10/13/2016] vancomycin  500 mg Intravenous Q M,W,F-HD  Dialysis Orders: MWF at [facility] 4 hours, 180 dialyzer, BFR 400, DFR 800, EDW 49.5kg, 2K/2Ca, PC - Aranesp weekly - Sodium Thiosulfate 25mg  q HD - Heparin 2000 units bolus q HD - NO VDRA  Assessment/Plan: 1.  Chest pain (with inspiration): Per primary and cardiology, volume may be contributing. Possible pericarditis. ECHO 12/19 with pericardial effusion and possible tamponade physiology. We have asked cardiology to weight in on this again. 2.  Pneumonia: On Vanc/Cefepime. 3.  ESRD: Usual MWF schedule, but volume excess on exam and echo. S/p HD 12/19 and 12/20, would like her to come for HD again today. 4.  Hypertension/volume: BP chronically low, but volume excess. UF as tolerated with HD. EDW will be lower on d/c. 5.  Anemia: Hgb 9.1. Continue Aranesp weekly. 6.  Metabolic bone disease: Ca ok, Phos high. Continue binders. 7.  Nutrition: Alb 2.8, Continue Nepro supplements. 8. Calciphylaxis: Pain control per primary/palliative. On sodium thiosulfate with  HD.  Ozzie Hoyle, PA-C 10/12/2016, 12:19 PM  [facility] Pager: [phone]  Renal Attending I agree with note as articulated above. Pleuritic CP is impressive and echo findings need clarification by cardiology.  She has been getting intensive HD. Neck veins not helpful with prior venous occlusions Hasnain Manheim C

## 2016-10-12 NOTE — Evaluation (Signed)
Physical Therapy Evaluation Patient Details Name: [name] Small MRN: [id number] DOB: [birth year] Today's Date: 10/12/2016   History of Present Illness  25 y.o. female  with past medical history of polycystic kidney disease on hemodialysis, T spine fractures, CHF with an EF of 10 - 15% ICD in place, sickle cell anemia, PE, DVT, and most recently calciphylaxis wounds who was admitted on 10/09/2016 with chest pain on inspiration, hyperkalemia, volume overload and calciphylaxis pain.  Clinical Impression  Pt admitted with/for the complications above.  She is able to meet her minimal mobility requirements with supervision with increased pain on increased meds.  Pt currently limited functionally due to the problems listed below.  (see problems list.)  Pt will benefit from PT to maximize function and safety to be able to get home safely with available assist of family.     Follow Up Recommendations Home health PT    Equipment Recommendations       Recommendations for Other Services       Precautions / Restrictions Precautions Precautions: Fall;Back Precaution Comments: Patient able to state precautions. Required Braces or Orthoses: Spinal Brace Spinal Brace: Thoracolumbosacral orthotic Restrictions Weight Bearing Restrictions: No      Mobility  Bed Mobility Overal bed mobility: Needs Assistance Bed Mobility: Supine to Sit;Sit to Supine     Supine to sit: Supervision;HOB elevated Sit to supine: Supervision;HOB elevated   General bed mobility comments: increased time and no assist otherwise with extra pain meds  Transfers Overall transfer level: Needs assistance   Transfers: Sit to/from Stand;Stand Pivot Transfers Sit to Stand: Supervision Stand pivot transfers: Supervision       General transfer comment: stand and pivot painful and antalgic, but no assist needed and pt very safety conscious.  Ambulation/Gait             General Gait Details: not tested due to pt  too painful.  Stairs            Wheelchair Mobility    Modified Rankin (Stroke Patients Only)       Balance Overall balance assessment: Needs assistance   Sitting balance-Leahy Scale: Fair     Standing balance support: Single extremity supported;Bilateral upper extremity supported Standing balance-Leahy Scale: Poor Standing balance comment: reliant on stable surfaces to hold to.                             Pertinent Vitals/Pain Pain Assessment: 0-10 Pain Score: 8  Pain Location: R LE wound area Pain Descriptors / Indicators: Burning;Grimacing;Guarding Pain Intervention(s): Premedicated before session;Limited activity within patient's tolerance    Home Living Family/patient expects to be discharged to:: Private residence Living Arrangements: Alone Available Help at Discharge: Friend(s);Family;Available 24 hours/day Type of Home: Apartment Home Access: Level entry     Home Layout: One level Home Equipment: Bedside commode;Walker - 4 wheels Additional Comments: Pt sponge bathing since she has a femoreal dialysis catheter    Prior Function Level of Independence: Independent with assistive device(s);Needs assistance   Gait / Transfers Assistance Needed: pt reports that she uses 4 wheeled RW  ADL's / Homemaking Assistance Needed: Since fall and spinal fx, assist needed with LB ADLs from boyfriend. Not able to perform IADLs.  Comments: Pt uses [place] transport to op dialysis     Hand Dominance   Dominant Hand: Right    Extremity/Trunk Assessment   Upper Extremity Assessment Upper Extremity Assessment: Defer to OT evaluation    Lower Extremity  Assessment Lower Extremity Assessment: Generalized weakness       Communication   Communication: No difficulties  Cognition Arousal/Alertness: Awake/alert Behavior During Therapy: WFL for tasks assessed/performed Overall Cognitive Status: Within Functional Limits for tasks assessed                       General Comments      Exercises     Assessment/Plan    PT Assessment All further PT needs can be met in the next venue of care  PT Problem List Decreased strength;Decreased activity tolerance;Decreased balance;Decreased mobility;Decreased knowledge of use of DME;Pain          PT Treatment Interventions Gait training;Functional mobility training;Therapeutic activities;Balance training;Patient/family education    PT Goals (Current goals can be found in the Care Plan section)  Acute Rehab PT Goals Patient Stated Goal: go home today PT Goal Formulation: With patient Time For Goal Achievement: 10/19/16 Potential to Achieve Goals: Fair    Frequency Min 3X/week   Barriers to discharge        Co-evaluation               End of Session Equipment Utilized During Treatment: Back brace Activity Tolerance: Patient limited by pain Patient left: in bed;with call bell/phone within reach;with family/visitor present Nurse Communication: Mobility status         Time: 3546-5681 PT Time Calculation (min) (ACUTE ONLY): 26 min   Charges:   PT Evaluation $PT Eval Moderate Complexity: 1 Procedure PT Treatments $Therapeutic Activity: 8-22 mins   PT G CodesTessie Fass Walida Small 10/12/2016, 2:07 PM 10/12/2016  [name] Small, PT [phone]  (pager)

## 2016-10-12 NOTE — Discharge Instructions (Signed)
Neuropathic Pain Introduction Neuropathic pain is pain caused by damage to the nerves that are responsible for certain sensations in your body (sensory nerves). The pain can be caused by damage to:  The sensory nerves that send signals to your spinal cord and brain (peripheral nervous system).  The sensory nerves in your brain or spinal cord (central nervous system). Neuropathic pain can make you more sensitive to pain. What would be a minor sensation for most people may feel very painful if you have neuropathic pain. This is usually a long-term condition that can be difficult to treat. The type of pain can differ from person to person. It may start suddenly (acute), or it may develop slowly and last for a long time (chronic). Neuropathic pain may come and go as damaged nerves heal or may stay at the same level for years. It often causes emotional distress, loss of sleep, and a lower quality of life. What are the causes? The most common cause of damage to a sensory nerve is diabetes. Many other diseases and conditions can also cause neuropathic pain. Causes of neuropathic pain can be classified as:  Toxic. Many drugs and chemicals can cause toxic damage. The most common cause of toxic neuropathic pain is damage from drug treatment for cancer (chemotherapy).  Metabolic. This type of pain can happen when a disease causes imbalances that damage nerves. Diabetes is the most common of these diseases. Vitamin B deficiency caused by long-term alcohol abuse is another common cause.  Traumatic. Any injury that cuts, crushes, or stretches a nerve can cause damage and pain. A common example is feeling pain after losing an arm or leg (phantom limb pain).  Compression-related. If a sensory nerve gets trapped or compressed for a long period of time, the blood supply to the nerve can be cut off.  Vascular. Many blood vessel diseases can cause neuropathic pain by decreasing blood supply and oxygen to  nerves.  Autoimmune. This type of pain results from diseases in which the body's defense system mistakenly attacks sensory nerves. Examples of autoimmune diseases that can cause neuropathic pain include lupus and multiple sclerosis.  Infectious. Many types of viral infections can damage sensory nerves and cause pain. Shingles infection is a common cause of this type of pain.  Inherited. Neuropathic pain can be a symptom of many diseases that are passed down through families (genetic). What are the signs or symptoms? The main symptom is pain. Neuropathic pain is often described as:  Burning.  Shock-like.  Stinging.  Hot or cold.  Itching. How is this diagnosed? No single test can diagnose neuropathic pain. Your health care provider will do a physical exam and ask you about your pain. You may use a pain scale to describe how bad your pain is. You may also have tests to see if you have a high sensitivity to pain and to help find the cause and location of any sensory nerve damage. These tests may include:  Imaging studies, such as:  X-rays.  CT scan.  MRI.  Nerve conduction studies to test how well nerve signals travel through your sensory nerves (electrodiagnostic testing).  Stimulating your sensory nerves through electrodes on your skin and measuring the response in your spinal cord and brain (somatosensory evoked potentials). How is this treated? Treatment for neuropathic pain may change over time. You may need to try different treatment options or a combination of treatments. Some options include:  Over-the-counter pain relievers.  Prescription medicines. Some medicines used to treat   other conditions may also help neuropathic pain. These include medicines to:  Control seizures (anticonvulsants).  Relieve depression (antidepressants).  Prescription-strength pain relievers (narcotics). These are usually used when other pain relievers do not help.  Transcutaneous nerve  stimulation (TENS). This uses electrical currents to block painful nerve signals. The treatment is painless.  Topical and local anesthetics. These are medicines that numb the nerves. They can be injected as a nerve block or applied to the skin.  Alternative treatments, such as:  Acupuncture.  Meditation.  Massage.  Physical therapy.  Pain management programs.  Counseling. Follow these instructions at home:  Learn as much as you can about your condition.  Take medicines only as directed by your health care provider.  Work closely with all your health care providers to find what works best for you.  Have a good support system at home.  Consider joining a chronic pain support group. Contact a health care provider if:  Your pain treatments are not helping.  You are having side effects from your medicines.  You are struggling with fatigue, mood changes, depression, or anxiety. This information is not intended to replace advice given to you by your health care provider. Make sure you discuss any questions you have with your health care provider. Document Released: 07/06/2004 Document Revised: 04/28/2016 Document Reviewed: 03/19/2014  2017 Elsevier  

## 2016-10-12 NOTE — Progress Notes (Signed)
We need cardiac clearance before acceptance back into outpt hemodialysis unit. [name] Small C

## 2016-10-14 LAB — CULTURE, BLOOD (ROUTINE X 2)
Culture: NO GROWTH
Culture: NO GROWTH

## 2016-10-17 ENCOUNTER — Encounter: Payer: Self-pay | Admitting: Cardiology

## 2016-10-26 ENCOUNTER — Inpatient Hospital Stay (HOSPITAL_BASED_OUTPATIENT_CLINIC_OR_DEPARTMENT_OTHER)
Admission: EM | Admit: 2016-10-26 | Discharge: 2016-10-28 | DRG: 640 | Disposition: A | Discharge status: Home / Self Care | Payer: Medicare Other | Attending: Internal Medicine | Admitting: Internal Medicine

## 2016-10-26 ENCOUNTER — Encounter (HOSPITAL_BASED_OUTPATIENT_CLINIC_OR_DEPARTMENT_OTHER): Payer: Self-pay | Admitting: *Deleted

## 2016-10-26 DIAGNOSIS — R079 Chest pain, unspecified: Secondary | ICD-10-CM | POA: Diagnosis present

## 2016-10-26 DIAGNOSIS — Z9115 Patient's noncompliance with renal dialysis: Secondary | ICD-10-CM

## 2016-10-26 DIAGNOSIS — E875 Hyperkalemia: Secondary | ICD-10-CM | POA: Diagnosis present

## 2016-10-26 DIAGNOSIS — I5023 Acute on chronic systolic (congestive) heart failure: Secondary | ICD-10-CM | POA: Diagnosis present

## 2016-10-26 DIAGNOSIS — Z87891 Personal history of nicotine dependence: Secondary | ICD-10-CM | POA: Diagnosis not present

## 2016-10-26 DIAGNOSIS — L97929 Non-pressure chronic ulcer of unspecified part of left lower leg with unspecified severity: Secondary | ICD-10-CM | POA: Diagnosis present

## 2016-10-26 DIAGNOSIS — I160 Hypertensive urgency: Secondary | ICD-10-CM | POA: Diagnosis present

## 2016-10-26 DIAGNOSIS — Z95810 Presence of automatic (implantable) cardiac defibrillator: Secondary | ICD-10-CM | POA: Diagnosis not present

## 2016-10-26 DIAGNOSIS — Z66 Do not resuscitate: Secondary | ICD-10-CM | POA: Diagnosis present

## 2016-10-26 DIAGNOSIS — R52 Pain, unspecified: Secondary | ICD-10-CM | POA: Diagnosis present

## 2016-10-26 DIAGNOSIS — I132 Hypertensive heart and chronic kidney disease with heart failure and with stage 5 chronic kidney disease, or end stage renal disease: Secondary | ICD-10-CM | POA: Diagnosis present

## 2016-10-26 DIAGNOSIS — Z992 Dependence on renal dialysis: Secondary | ICD-10-CM | POA: Diagnosis not present

## 2016-10-26 DIAGNOSIS — G894 Chronic pain syndrome: Secondary | ICD-10-CM | POA: Diagnosis present

## 2016-10-26 DIAGNOSIS — N2581 Secondary hyperparathyroidism of renal origin: Secondary | ICD-10-CM | POA: Diagnosis present

## 2016-10-26 DIAGNOSIS — E8770 Fluid overload, unspecified: Secondary | ICD-10-CM | POA: Diagnosis present

## 2016-10-26 DIAGNOSIS — D571 Sickle-cell disease without crisis: Secondary | ICD-10-CM | POA: Diagnosis present

## 2016-10-26 DIAGNOSIS — D631 Anemia in chronic kidney disease: Secondary | ICD-10-CM | POA: Diagnosis present

## 2016-10-26 DIAGNOSIS — N185 Chronic kidney disease, stage 5: Secondary | ICD-10-CM | POA: Diagnosis present

## 2016-10-26 DIAGNOSIS — Z86718 Personal history of other venous thrombosis and embolism: Secondary | ICD-10-CM | POA: Diagnosis not present

## 2016-10-26 DIAGNOSIS — Z9114 Patient's other noncompliance with medication regimen: Secondary | ICD-10-CM | POA: Diagnosis not present

## 2016-10-26 DIAGNOSIS — Z79899 Other long term (current) drug therapy: Secondary | ICD-10-CM | POA: Diagnosis not present

## 2016-10-26 DIAGNOSIS — Z91158 Patient's noncompliance with renal dialysis for other reason: Secondary | ICD-10-CM

## 2016-10-26 DIAGNOSIS — I32 Pericarditis in diseases classified elsewhere: Secondary | ICD-10-CM | POA: Diagnosis present

## 2016-10-26 DIAGNOSIS — L03115 Cellulitis of right lower limb: Secondary | ICD-10-CM | POA: Diagnosis present

## 2016-10-26 DIAGNOSIS — N189 Chronic kidney disease, unspecified: Secondary | ICD-10-CM | POA: Diagnosis present

## 2016-10-26 DIAGNOSIS — Z86711 Personal history of pulmonary embolism: Secondary | ICD-10-CM

## 2016-10-26 DIAGNOSIS — Z91148 Patient's other noncompliance with medication regimen for other reason: Secondary | ICD-10-CM

## 2016-10-26 DIAGNOSIS — N186 End stage renal disease: Secondary | ICD-10-CM | POA: Diagnosis present

## 2016-10-26 DIAGNOSIS — Q613 Polycystic kidney, unspecified: Secondary | ICD-10-CM

## 2016-10-26 DIAGNOSIS — L97921 Non-pressure chronic ulcer of unspecified part of left lower leg limited to breakdown of skin: Secondary | ICD-10-CM | POA: Diagnosis present

## 2016-10-26 DIAGNOSIS — L03116 Cellulitis of left lower limb: Secondary | ICD-10-CM | POA: Diagnosis present

## 2016-10-26 DIAGNOSIS — Z862 Personal history of diseases of the blood and blood-forming organs and certain disorders involving the immune mechanism: Secondary | ICD-10-CM

## 2016-10-26 LAB — BASIC METABOLIC PANEL
Anion gap: 22 — ABNORMAL HIGH (ref 5–15)
BUN: 139 mg/dL — AB (ref 6–20)
CHLORIDE: 93 mmol/L — AB (ref 101–111)
CO2: 19 mmol/L — ABNORMAL LOW (ref 22–32)
CREATININE: 13.6 mg/dL — AB (ref 0.44–1.00)
Calcium: 10.8 mg/dL — ABNORMAL HIGH (ref 8.9–10.3)
GFR calc Af Amer: 4 mL/min — ABNORMAL LOW (ref >60)
GFR, EST NON AFRICAN AMERICAN: 3 mL/min — AB (ref >60)
GLUCOSE: 96 mg/dL (ref 65–99)
POTASSIUM: 7.7 mmol/L — AB (ref 3.5–5.1)
Sodium: 134 mmol/L — ABNORMAL LOW (ref 135–145)

## 2016-10-26 LAB — CBC WITH DIFFERENTIAL/PLATELET
Basophils Absolute: 0 10*3/uL (ref 0.0–0.1)
Basophils Relative: 0 %
EOS PCT: 1 %
Eosinophils Absolute: 0.1 10*3/uL (ref 0.0–0.7)
HEMATOCRIT: 31.3 % — AB (ref 36.0–46.0)
Hemoglobin: 9.8 g/dL — ABNORMAL LOW (ref 12.0–15.0)
LYMPHS ABS: 1.1 10*3/uL (ref 0.7–4.0)
Lymphocytes Relative: 15 %
MCH: 27.1 pg (ref 26.0–34.0)
MCHC: 31.3 g/dL (ref 30.0–36.0)
MCV: 86.7 fL (ref 78.0–100.0)
MONOS PCT: 5 %
Monocytes Absolute: 0.4 10*3/uL (ref 0.1–1.0)
NEUTROS ABS: 6 10*3/uL (ref 1.7–7.7)
Neutrophils Relative %: 79 %
Platelets: 288 10*3/uL (ref 150–400)
RBC: 3.61 MIL/uL — ABNORMAL LOW (ref 3.87–5.11)
RDW: 21.8 % — AB (ref 11.5–15.5)
WBC: 7.6 10*3/uL (ref 4.0–10.5)

## 2016-10-26 LAB — RETICULOCYTES
RBC.: 3.62 MIL/uL — ABNORMAL LOW (ref 3.87–5.11)
Retic Count, Absolute: 68.8 10*3/uL (ref 19.0–186.0)
Retic Ct Pct: 1.9 % (ref 0.4–3.1)

## 2016-10-26 LAB — GLUCOSE, CAPILLARY: GLUCOSE-CAPILLARY: 54 mg/dL — AB (ref 65–99)

## 2016-10-26 MED ORDER — DIPHENHYDRAMINE HCL 50 MG/ML IJ SOLN
25.0000 mg | Freq: Once | INTRAMUSCULAR | Status: AC
  Administered 2016-10-26: 25 mg via INTRAVENOUS

## 2016-10-26 MED ORDER — DIPHENHYDRAMINE HCL 50 MG/ML IJ SOLN
INTRAMUSCULAR | Status: AC
  Administered 2016-10-28: 6.25 mg via INTRAVENOUS
  Filled 2016-10-26: qty 1

## 2016-10-26 MED ORDER — DEXTROSE 50 % IV SOLN
50.0000 mL | Freq: Once | INTRAVENOUS | Status: AC
  Administered 2016-10-26: 50 mL via INTRAVENOUS
  Filled 2016-10-26: qty 50

## 2016-10-26 MED ORDER — INSULIN ASPART 100 UNIT/ML IV SOLN
10.0000 [IU] | Freq: Once | INTRAVENOUS | Status: AC
  Administered 2016-10-26: 10 [IU] via INTRAVENOUS
  Filled 2016-10-26: qty 1

## 2016-10-26 MED ORDER — SODIUM CHLORIDE 0.9 % IV SOLN
1.0000 g | Freq: Once | INTRAVENOUS | Status: AC
  Administered 2016-10-26: 1 g via INTRAVENOUS
  Filled 2016-10-26: qty 10

## 2016-10-26 MED ORDER — HYDROMORPHONE HCL 1 MG/ML IJ SOLN
2.0000 mg | Freq: Once | INTRAMUSCULAR | Status: AC
  Administered 2016-10-26: 2 mg via INTRAVENOUS
  Filled 2016-10-26: qty 2

## 2016-10-26 NOTE — ED Triage Notes (Signed)
Patient is alert and oriented x4   She is complaining of chest pain that started yesterday.  Patient has a Hx of CHF and a defibrillator in place.  Currently she rates her pain 8 of 10.

## 2016-10-26 NOTE — Progress Notes (Addendum)
Accepted to stepdown bed at [facility] under inpatient status for hyperkalemia in setting of not going to HD for a week.   Nephrology (Dr. Allena Katz) accepted the patient in consult and will arrange emergent HD, but advised a hospitalist admission.   Potassium is 7.7 on arrival. EKG not significantly changed from prior. Saturating well on rm air. Hypertensive urgency.    She is treated with temporizing measures at [facility] prior to transfer.   ADDENDUM:  No SDU beds available at [facility] at this time. [facility] requesting she be accepted to telemetry bed. Given magnitude of hyperkalemia, degree of underlying comorbidity, risk of worsening en route, do not feel that she is appropriate for telemetry. Advised inquiring with intensivist regarding possible ICU admission.

## 2016-10-26 NOTE — ED Notes (Signed)
Awaiting bed assignment at [facility]. ED MD aware

## 2016-10-26 NOTE — ED Notes (Signed)
EMS unable to take w/c from home and mother Mollyanne Schwarzkopf will return to ED tomorrow to pick up, chair placed in EMS room.

## 2016-10-26 NOTE — ED Provider Notes (Signed)
[facility]Y [facility] Provider Note   CSN: [id number] Arrival date & time: 10/26/16  1807   By signing my name below, I, Cynda Acres, attest that this documentation has been prepared under the direction and in the presence of Geoffery Lyons, MD. Electronically Signed: Cynda Acres, Scribe. 10/26/16. 7:20 PM.   History   Chief Complaint Chief Complaint  Patient presents with  . Chest Pain    HPI: [name] Small is a 26 y.o. female with a pMHX of sickle cell anemia, GERD, HTN, CHF defibrillator in place, and  ESRD on dialysis who presents to the Emergency Department complaining of sudden-onset, constant chest pain that began yesterday. Patient states she hasn't been feeling well the past few days. She describes the severity of her pain as an 8 out of 10. Patient states she missed dialysis for one week because she didn't feel like getting up. No modifying factors indicated. She denies any abdominal pain or any other symptoms.    The history is provided by the patient. No language interpreter was used.    Past Medical History:  Diagnosis Date  . 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 Anemia   . Anxiety   . Cardiomyopathy   . Cellulitis and abscess of face 03/22/2013  . CHF (congestive heart failure) (HCC)   . Chronic anticoagulation   . Chronic pain   . Depression   . DVT (deep venous thrombosis) (HCC) ~ 2014   BLE  . Dysrhythmia    at times per pt.  . 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  Marland Kitchen ESRD (end stage renal disease) on dialysis [facility])    "MWF; [facility] " (09/2016)  . Fracture, thoracic vertebra (HCC) 07/2016   "T7-T12"  . GERD (gastroesophageal reflux disease)   . H/O transfusion of packed red blood cells   . Heart murmur   . Hypertension   . Narcotic abuse, continuous   .  Osteoporosis   . Pelvic fracture (HCC) 06/2015   "on the right"  . Pulmonary emboli (HCC) 01/2012   Bilateral, moderate clot burden, areas of pulmonary infarction and central necrosis  . Sickle cell anemia [facility])     Patient Active Problem List   Diagnosis Date Noted  . Shortness of breath   . Ulcer of left lower extremity, limited to breakdown of skin (HCC) 10/03/2016  . Uncontrolled pain   . Severe osteopetrosis   . Adjustment disorder with mixed anxiety and depressed mood   . Fall   . Tobacco abuse   . Leukocytosis   . DNR (do not resuscitate)   . Pain of right lower extremity   . Palliative care by specialist   . Advance care planning   . Goals of care, counseling/discussion   . 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 Palliative care encounter   . Edema of right lower extremity 09/14/2016  . Leg edema, right 09/14/2016  . Cellulitis 09/13/2016  . Generalized anxiety disorder 09/05/2016  . Post-operative pain   . Labile blood pressure   . Closed nondisplaced fracture of pelvis (HCC)   . Other osteoporosis with current pathological fracture   . Recurrent major depressive disorder, in partial remission (HCC)   . Acute blood loss anemia   . 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 Anemia of chronic disease   . H/O medication noncompliance   .  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 Acute on chronic systolic CHF (congestive heart failure) (HCC) 08/16/2016  . Noncompliance of patient with renal dialysis (HCC) 08/12/2016  . 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 Osteoporosis 08/01/2016  . Thoracic compression fracture - T7, T8, T12 07/27/2016  . Acute pulmonary edema (HCC) 07/26/2016  . Essential hypertension 06/17/2016  . Depression with anxiety 06/17/2016  . Volume overload 04/14/2016  . Peripheral edema 04/14/2016  . NICM (nonischemic cardiomyopathy) (HCC) 04/14/2016  . Internal jugular (IJ) vein thromboembolism, acute (HCC)  04/14/2016  . Pericardial effusion 04/14/2016  . Chronic anticoagulation   . Hypertensive urgency 04/11/2016  . DVT (deep venous thrombosis), left 04/11/2016  . Cellulitis of left upper extremity 03/31/2016  . Chronic pain syndrome 02/29/2016  . Chronic systolic heart failure (HCC) 01/04/2016  . AICD (automatic cardioverter/defibrillator) present 01/04/2016  . 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 Pathologic pelvic fracture 01/04/2016  . Depression 01/04/2016  . Pain in the chest   . ESRD on dialysis (HCC)   . Tachycardia   . Elevated serum hCG   . Chest pain 10/11/2015  . Fluid overload 10/10/2015  . Hypertension 10/10/2015  . Anemia of chronic renal failure, stage 5 (HCC) 10/04/2015  . Thrombocytopenia (HCC) 10/04/2015  . Chest pain of uncertain etiology 10/04/2015  . Chronically Elevated troponin 03/03/2015  . Hyperkalemia 02/21/2015  . Acute on chronic systolic heart failure (HCC) 02/21/2012  . Pulmonary infarct (HCC) 02/07/2012  . ESRD (end stage renal disease) on dialysis (HCC) 02/03/2012  . PE (pulmonary embolism) 02/03/2012    Past Surgical History:  Procedure Laterality Date  . AV FISTULA PLACEMENT Bilateral    "right side stopped working & never had it revised" (04/11/2016)  . AV FISTULA PLACEMENT Right 08/01/2016   Procedure: INSERTION OF ARTERIOVENOUS (AV) GORE-TEX GRAFT RIGHT ARM;  Surgeon: Chuck Hint, MD;  Location: [facility] OR;  Service: Vascular;  Laterality: Right;  . AV FISTULA PLACEMENT Right 08/31/2016   Procedure: INSERTION OF ARTERIOVENOUS (AV) GORE-TEX GRAFT- RIGHT FEMORAL LOOP GRAFT;  Surgeon: Maeola Harman, MD;  Location: [facility] OR;  Service: Vascular;  Laterality: Right;  . AVGG REMOVAL Right 08/03/2016   Procedure: REMOVAL OF ARTERIOVENOUS GORETEX GRAFT (AVGG);  Surgeon: Maeola Harman, MD;  Location: [facility] OR;  Service: Vascular;  Laterality: Right;  . CARDIAC CATHETERIZATION    . IMPLANTABLE CARDIOVERTER DEFIBRILLATOR IMPLANT  Right 12/2014  . INCISION AND DRAINAGE ABSCESS Right 03/21/2013   Procedure: INCISION AND DRAINAGE RIGHT CHEEK ABSCESS REMOVAL OF FOREIGN BODY;  Surgeon: Serena Colonel, MD;  Location: [facility] OR;  Service: ENT;  Laterality: Right;  . INSERTION OF DIALYSIS CATHETER Right Mar 09, 2016   thigh, Dr. Wyn Quaker, [facility]  . INSERTION OF DIALYSIS CATHETER Left 09/02/2016   Procedure: Insertion of Dialysis Catheter Left Femoral Vein;  Surgeon: Maeola Harman, MD;  Location: [facility] OR;  Service: Vascular;  Laterality: Left;  . 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 NEPHRECTOMY Right     kidney placed in 2008, and body rejected in 2012   . PERIPHERAL VASCULAR CATHETERIZATION Left 03/09/2016   Procedure: A/V Shuntogram/Fistulagram;  Surgeon: Annice Needy, MD;  Location: [facility] INVASIVE CV LAB;  Service: Cardiovascular;  Laterality: Left;  . PERIPHERAL VASCULAR CATHETERIZATION N/A 05/09/2016   Procedure: Dialysis/Perma Catheter Removal;  Surgeon: Renford Dills, MD;  Location: [facility] INVASIVE CV LAB;  Service: Cardiovascular;  Laterality: N/A;  . PERIPHERAL VASCULAR CATHETERIZATION N/A 07/12/2016   Procedure:  Dialysis/Perma Catheter Insertion;  Surgeon: Annice Needy, MD;  Location: [facility] INVASIVE CV LAB;  Service: Cardiovascular;  Laterality: N/A;  . PERIPHERAL VASCULAR CATHETERIZATION Right 07/31/2016   Procedure: Upper Extremity Venography;  Surgeon: Maeola Harman, MD;  Location: [facility] INVASIVE CV LAB;  Service: Cardiovascular;  Laterality: Right;  . REMOVAL OF GRAFT Right 09/15/2016   Procedure: REMOVAL OF RIGHT THIGH GRAFT;  Surgeon: Sherren Kerns, MD;  Location: [facility] OR;  Service: Vascular;  Laterality: Right;  . REVISON OF ARTERIOVENOUS FISTULA Left 03/22/2016   Procedure: REVISON OF ARTERIOVENOUS FISTULA ( ARTEGRAFT );  Surgeon: Annice Needy, MD;  Location: [facility] ORS;  Service: Vascular;  Laterality: Left;  . TONSILLECTOMY AND ADENOIDECTOMY  ~ 2000    OB History    No data available       Home Medications     Prior to Admission medications   Medication Sig Start Date End Date Taking? Authorizing Provider  calcitonin, salmon, (MIACALCIN) 200 UNIT/ACT nasal spray Place 1 spray into alternate nostrils daily. 09/01/16   Mcarthur Rossetti Angiulli, PA-C  carvedilol (COREG) 3.125 MG tablet Take 1 tablet (3.125 mg total) by mouth 2 (two) times daily. 10/12/16   Dorothea Ogle, MD  ceFEPIme 2 g in dextrose 5 % 50 mL Inject 2 g into the vein every Monday, Wednesday, and Friday at 8 PM. Give with next HD on 12/22, 12/25, 12/27 and then stop 10/13/16   Dorothea Ogle, MD  cinacalcet (SENSIPAR) 30 MG tablet Take 3 tablets (90 mg total) by mouth daily with supper. 10/12/16   Dorothea Ogle, MD  colchicine 0.6 MG tablet Take 1 tablet (0.6 mg total) by mouth daily. 10/12/16   Alison Murray, MD  famotidine (PEPCID) 20 MG tablet Take 1 tablet (20 mg total) by mouth 2 (two) times daily as needed for heartburn or indigestion. 09/06/16   Ankit Karis Juba, MD  fentaNYL (DURAGESIC - DOSED MCG/HR) 100 MCG/HR Place 1 patch (100 mcg total) onto the skin every 3 (three) days. 10/11/16   Tora Kindred York, PA-C  gabapentin (NEURONTIN) 100 MG capsule Take 3 capsules (300 mg total) by mouth at bedtime. Patient taking differently: Take 100-200 mg by mouth 2 (two) times daily. 100mg  at breakfast and 200mg  at lunch 09/27/16   Zannie Cove, MD  ibuprofen (ADVIL,MOTRIN) 200 MG tablet Take 1 tablet (200 mg total) by mouth 3 (three) times daily. 10/12/16   Alison Murray, MD  lanthanum (FOSRENOL) 1000 MG chewable tablet Chew 1 tablet (1,000 mg total) by mouth 3 (three) times daily with meals. 09/01/16   Mcarthur Rossetti Angiulli, PA-C  lidocaine (LIDODERM) 5 % Place 1 patch onto the skin daily. Remove & Discard patch within 12 hours or as directed by MD 10/12/16   Alison Murray, MD  LORazepam (ATIVAN) 1 MG tablet Take 1 tablet (1 mg total) by mouth at bedtime. Patient taking differently: Take 1 mg by mouth daily.  09/05/16   Quentin Angst, MD    methocarbamol (ROBAXIN) 500 MG tablet Take 1 tablet (500 mg total) by mouth every 6 (six) hours as needed for muscle spasms. 09/01/16   Mcarthur Rossetti Angiulli, PA-C  multivitamin (RENA-VIT) TABS tablet Take 1 tablet by mouth daily. 09/01/16   Mcarthur Rossetti Angiulli, PA-C  Oxycodone HCl 10 MG TABS Take 3 tablets (30 mg total) by mouth every 4 (four) hours as needed. 10/11/16   Tora Kindred York, PA-C  sertraline (ZOLOFT) 100 MG tablet Take 1 tablet (  100 mg total) by mouth daily. 09/01/16   Mcarthur Rossetti Angiulli, PA-C  vancomycin 500 mg in sodium chloride 0.9 % 100 mL Inject 500 mg into the vein every Monday, Wednesday, and Friday with hemodialysis. Give with next HD 12/22, 12/25, 12/27 and then stop 10/13/16   Dorothea Ogle, MD    Family History Family History  Problem Relation Age of Onset  . Polycystic kidney disease Father   . Hypertension Father     Social History Social History  Substance Use Topics  . Smoking status: Former Smoker    Packs/day: 0.00    Years: 1.00    Types: Cigarettes    Quit date: 12/08/2015  . Smokeless tobacco: Never Used  . Alcohol use No     Allergies   Aspirin; Tramadol; Vicodin [hydrocodone-acetaminophen]; Buprenorphine hcl; Iohexol; and Morphine and related   Review of Systems Review of Systems  Cardiovascular: Positive for chest pain.  All other systems reviewed and are negative.    Physical Exam Updated Vital Signs BP (!) 162/129 (BP Location: Right Arm)   Pulse 104   Temp 97.8 F (36.6 C) (Oral)   Resp 24   Ht 5\' 3"  (1.6 m)   Wt 111 lb (50.3 kg)   SpO2 96%   BMI 19.66 kg/m   Physical Exam  Constitutional: She is oriented to person, place, and time. No distress.  Patient appears chronically ill.   HENT:  Head: Normocephalic and atraumatic.  Mouth/Throat: Oropharynx is clear and moist.  Eyes: EOM are normal.  Neck: Normal range of motion.  Cardiovascular: Normal rate, regular rhythm and normal heart sounds.   Pulmonary/Chest: Effort normal  and breath sounds normal.  Abdominal: Soft. She exhibits no distension. There is no tenderness.  Musculoskeletal: Normal range of motion.  There are lesions of calciphylaxis to both lower extremities.   Neurological: She is alert and oriented to person, place, and time.  Skin: Skin is warm and dry.  Psychiatric: She has a normal mood and affect. Judgment normal.  Nursing note and vitals reviewed.    ED Treatments / Results  DIAGNOSTIC STUDIES: Oxygen Saturation is 96% on RA, normal by my interpretation.    COORDINATION OF CARE: 7:20 PM Discussed treatment plan with pt at bedside and pt agreed to plan.  Labs (all labs ordered are listed, but only abnormal results are displayed) Labs Reviewed - No data to display  EKG  EKG Interpretation  Date/Time:  Thursday October 26 2016 18:17:56 EST Ventricular Rate:  101 PR Interval:    QRS Duration: 102 QT Interval:  414 QTC Calculation: 536 R Axis:   129 Text Interpretation:  Sinus tachycardia with 1st degree A-V block Right axis deviation ST & T wave abnormality, consider inferolateral ischemia Prolonged QT Abnormal ECG Confirmed by Kris Burd  MD, Essa Wenk ([id number]) on 10/26/2016 7:05:04 PM       Radiology No results found.  Procedures Procedures (including critical care time)  Medications Ordered in ED Medications - No data to display   Initial Impression / Assessment and Plan / ED Course  I have reviewed the triage vital signs and the nursing notes.  Pertinent labs & imaging results that were available during my care of the patient were reviewed by me and considered in my medical decision making (see chart for details).  Clinical Course    Patient with extensive past medical history including sickle cell disease, end-stage renal disease on hemodialysis, and pacer/defibrillator. She presents today with complaints of not feeling well.  She has not attended dialysis in the last week because of reported pain in her legs. Her workup  today reveals a potassium of 7.7. She was given glucose and insulin as well as calcium.  I've discussed the case with Dr. Allena Katz from nephrology who is very familiar with this patient. He is recommending transfer to [facility] for admission and urgent dialysis. Her EKG shows no obvious changes. I've spoken with Dr. Antionette Char who agrees to admit.  CRITICAL CARE Performed by: Geoffery Lyons Total critical care time: 45 minutes Critical care time was exclusive of separately billable procedures and treating other patients. Critical care was necessary to treat or prevent imminent or life-threatening deterioration. Critical care was time spent personally by me on the following activities: development of treatment plan with patient and/or surrogate as well as nursing, discussions with consultants, evaluation of patient's response to treatment, examination of patient, obtaining history from patient or surrogate, ordering and performing treatments and interventions, ordering and review of laboratory studies, ordering and review of radiographic studies, pulse oximetry and re-evaluation of patient's condition.   Final Clinical Impressions(s) / ED Diagnoses   Final diagnoses:  None    New Prescriptions New Prescriptions   No medications on file   I personally performed the services described in this documentation, which was scribed in my presence. The recorded information has been reviewed and is accurate.        Geoffery Lyons, MD 10/26/16 2046

## 2016-10-26 NOTE — ED Notes (Addendum)
States has been out of Fentanyl patches and Roxicet x 1 week and not had a HD tx since last Friday. Pain to lower legs and chest . Permcath to left groin

## 2016-10-26 NOTE — ED Notes (Signed)
C/o of itching, no visible rash noted

## 2016-10-27 ENCOUNTER — Encounter (HOSPITAL_COMMUNITY): Payer: Self-pay | Admitting: *Deleted

## 2016-10-27 DIAGNOSIS — E875 Hyperkalemia: Principal | ICD-10-CM

## 2016-10-27 DIAGNOSIS — N186 End stage renal disease: Secondary | ICD-10-CM

## 2016-10-27 DIAGNOSIS — Z992 Dependence on renal dialysis: Secondary | ICD-10-CM

## 2016-10-27 LAB — GLUCOSE, CAPILLARY: Glucose-Capillary: 77 mg/dL (ref 65–99)

## 2016-10-27 LAB — RENAL FUNCTION PANEL
ALBUMIN: 3.2 g/dL — AB (ref 3.5–5.0)
ANION GAP: 17 — AB (ref 5–15)
BUN: 40 mg/dL — AB (ref 6–20)
CALCIUM: 10.3 mg/dL (ref 8.9–10.3)
CO2: 22 mmol/L (ref 22–32)
CREATININE: 5.93 mg/dL — AB (ref 0.44–1.00)
Chloride: 95 mmol/L — ABNORMAL LOW (ref 101–111)
GFR calc Af Amer: 10 mL/min — ABNORMAL LOW (ref >60)
GFR calc non Af Amer: 9 mL/min — ABNORMAL LOW (ref >60)
GLUCOSE: 58 mg/dL — AB (ref 65–99)
PHOSPHORUS: 4.6 mg/dL (ref 2.5–4.6)
Potassium: 3.8 mmol/L (ref 3.5–5.1)
SODIUM: 134 mmol/L — AB (ref 135–145)

## 2016-10-27 MED ORDER — LIDOCAINE HCL (PF) 1 % IJ SOLN: 5.0000 mL | INTRAMUSCULAR | Status: DC | PRN

## 2016-10-27 MED ORDER — HEPARIN SODIUM (PORCINE) 1000 UNIT/ML DIALYSIS: 40.0000 [IU]/kg | INTRAMUSCULAR | Status: DC | PRN

## 2016-10-27 MED ORDER — HYDROMORPHONE HCL 1 MG/ML IJ SOLN
INTRAMUSCULAR | Status: AC
  Filled 2016-10-27: qty 1

## 2016-10-27 MED ORDER — HYDROMORPHONE HCL 1 MG/ML IJ SOLN
1.0000 mg | Freq: Four times a day (QID) | INTRAMUSCULAR | Status: DC | PRN
  Administered 2016-10-27: 1 mg via INTRAVENOUS
  Filled 2016-10-27: qty 1

## 2016-10-27 MED ORDER — FENTANYL CITRATE (PF) 100 MCG/2ML IJ SOLN
50.0000 ug | Freq: Once | INTRAMUSCULAR | Status: AC
  Administered 2016-10-27: 50 ug via INTRAVENOUS
  Filled 2016-10-27: qty 2

## 2016-10-27 MED ORDER — SODIUM CHLORIDE 0.9 % IV SOLN: 100.0000 mL | INTRAVENOUS | Status: DC | PRN

## 2016-10-27 MED ORDER — DIPHENHYDRAMINE HCL 25 MG PO CAPS
25.0000 mg | ORAL_CAPSULE | Freq: Four times a day (QID) | ORAL | Status: DC | PRN
  Administered 2016-10-27: 25 mg via ORAL
  Filled 2016-10-27: qty 1

## 2016-10-27 MED ORDER — HEPARIN SODIUM (PORCINE) 1000 UNIT/ML DIALYSIS: 1000.0000 [IU] | INTRAMUSCULAR | Status: DC | PRN

## 2016-10-27 MED ORDER — SODIUM THIOSULFATE 25 % IV SOLN: 25.0000 g | INTRAVENOUS | Status: DC

## 2016-10-27 MED ORDER — ALTEPLASE 2 MG IJ SOLR
2.0000 mg | Freq: Once | INTRAMUSCULAR | Status: DC | PRN
  Filled 2016-10-27: qty 2

## 2016-10-27 MED ORDER — HYDROMORPHONE HCL 1 MG/ML IJ SOLN
1.0000 mg | INTRAMUSCULAR | Status: DC | PRN
  Administered 2016-10-27 – 2016-10-28 (×9): 1 mg via INTRAVENOUS
  Filled 2016-10-27 (×8): qty 1

## 2016-10-27 MED ORDER — CINACALCET HCL 30 MG PO TABS
90.0000 mg | ORAL_TABLET | Freq: Every day | ORAL | Status: DC
  Administered 2016-10-27 – 2016-10-28 (×2): 90 mg via ORAL
  Filled 2016-10-27 (×2): qty 3

## 2016-10-27 MED ORDER — DARBEPOETIN ALFA 100 MCG/0.5ML IJ SOSY
100.0000 ug | PREFILLED_SYRINGE | INTRAMUSCULAR | Status: DC
  Administered 2016-10-27: 100 ug via INTRAVENOUS
  Filled 2016-10-27 (×3): qty 0.5

## 2016-10-27 MED ORDER — COLLAGENASE 250 UNIT/GM EX OINT
TOPICAL_OINTMENT | Freq: Every day | CUTANEOUS | Status: DC
  Filled 2016-10-27 (×2): qty 60

## 2016-10-27 MED ORDER — LANTHANUM CARBONATE 500 MG PO CHEW
1000.0000 mg | CHEWABLE_TABLET | Freq: Three times a day (TID) | ORAL | Status: DC
Start: 2016-10-27 — End: 2016-10-28
  Administered 2016-10-27 – 2016-10-28 (×3): 1000 mg via ORAL
  Filled 2016-10-27 (×5): qty 2

## 2016-10-27 MED ORDER — HEPARIN SODIUM (PORCINE) 1000 UNIT/ML DIALYSIS: 2500.0000 [IU] | Freq: Once | INTRAMUSCULAR | Status: DC

## 2016-10-27 MED ORDER — PENTAFLUOROPROP-TETRAFLUOROETH EX AERO: 1.0000 "application " | INHALATION_SPRAY | CUTANEOUS | Status: DC | PRN

## 2016-10-27 MED ORDER — HYDRALAZINE HCL 20 MG/ML IJ SOLN: 20.0000 mg | Freq: Four times a day (QID) | INTRAMUSCULAR | Status: DC | PRN

## 2016-10-27 MED ORDER — HEPARIN SODIUM (PORCINE) 5000 UNIT/ML IJ SOLN
5000.0000 [IU] | Freq: Three times a day (TID) | INTRAMUSCULAR | Status: DC
Start: 2016-10-27 — End: 2016-10-28
  Administered 2016-10-27 (×2): 5000 [IU] via SUBCUTANEOUS
  Filled 2016-10-27 (×5): qty 1

## 2016-10-27 MED ORDER — LIDOCAINE-PRILOCAINE 2.5-2.5 % EX CREA
1.0000 "application " | TOPICAL_CREAM | CUTANEOUS | Status: DC | PRN
  Filled 2016-10-27: qty 5

## 2016-10-27 MED ORDER — DIPHENHYDRAMINE HCL 25 MG PO CAPS
25.0000 mg | ORAL_CAPSULE | Freq: Once | ORAL | Status: AC
  Administered 2016-10-27: 25 mg via ORAL
  Filled 2016-10-27: qty 1

## 2016-10-27 MED ORDER — HYDROMORPHONE HCL 1 MG/ML IJ SOLN
1.0000 mg | INTRAMUSCULAR | Status: AC | PRN
  Administered 2016-10-27 (×2): 1 mg via INTRAVENOUS
  Filled 2016-10-27: qty 1

## 2016-10-27 MED ORDER — CARVEDILOL 3.125 MG PO TABS
3.1250 mg | ORAL_TABLET | Freq: Two times a day (BID) | ORAL | Status: DC
  Administered 2016-10-27 – 2016-10-28 (×2): 3.125 mg via ORAL
  Filled 2016-10-27 (×3): qty 1

## 2016-10-27 NOTE — Progress Notes (Signed)
eLink Physician-Brief Progress Note Patient Name: [name] Small DOB: 1991-06-08 MRN: [id number]   Date of Service  10/27/2016  HPI/Events of Note  Leg pain  eICU Interventions  Increase frequency of dilaudid prn to q3h     Intervention Category Intermediate Interventions: Pain - evaluation and management  Max Fickle 10/27/2016, 6:12 PM

## 2016-10-27 NOTE — Consult Note (Addendum)
WOC Nurse wound consult note Pt is familiar to [facility] team from recent admission on 12/19. She has multiple areas of dry eschar which are related to calciphylaxis. Wounds basically unchanged since previous admission. Wound type: Multiple full thickness wounds; all are very painful. Left posterior leg 4X1.5cm, dry eschar without odor, drainage, or fluctuance. Right inner thigh 2X2cm, dry eschar without odor, drainage, or fluctuance. Right outer thigh 2X4cm and 5X12cm, 40% reddish yellow open wounds in patchy areas, 60% dry eschar.  Small amt yellow drainage, no odor. Right posterior calf 5X2.5 cm, 50% slough, 50% dry eschar, small amt tan drainage, no odor. Right posterior calf 3X2cm, dry eschar with edges beginning to lift away from skin, revealing red dry wound bed. Right anterior calf 4.5X4cm, 100% slough/eschar, no odor or drainage Dressing procedure/placement/frequency: it is best practice to leave areas of dry stable calciphylaxis intact, it can remain open to air or protect with silicone foam dressing to protect from further injury. Santyl ointment to assist with chemical debridement of nonviable tissue to right anterior and posterior calf and right outer thigh deeper wound. Pt could benefit from follow-up at the outpatient wound care center after discharge care center; please order if desired.  Please consider ordering topical lidocaine gel to be applied PRN to painful wounds when dressings changes occur if you agree. This has been effective in the past. Discussed plan of care with patient and she verbalized understanding. Please re-consult if further assistance is needed.  Thank-you,  Cammie Mcgee MSN, RN, CWOCN, [place], CNS [phone]

## 2016-10-27 NOTE — Procedures (Signed)
I was present at this dialysis session. I have reviewed the session itself and made appropriate changes.   Will obtain f/u K after HD today. Tol 4L UF. BP remains up. Plan for HD again tomorrow.  Pt with excuses of scheduling and transportation for failure to obtain outpatient HD.    Filed Weights   10/26/16 1822 10/27/16 0000  Weight: 50.3 kg (111 lb) 48.9 kg (107 lb 12.9 oz)     Recent Labs Lab 10/26/16 1935  NA 134*  K 7.7*  CL 93*  CO2 19*  GLUCOSE 96  BUN 139*  CREATININE 13.60*  CALCIUM 10.8*     Recent Labs Lab 10/26/16 1935  WBC 7.6  NEUTROABS 6.0  HGB 9.8*  HCT 31.3*  MCV 86.7  PLT 288    Scheduled Meds: . cinacalcet  90 mg Oral Q supper  . darbepoetin (ARANESP) injection - DIALYSIS  100 mcg Intravenous Q Fri-HD  . heparin subcutaneous  5,000 Units Subcutaneous Q8H  . lanthanum  1,000 mg Oral TID WC   Continuous Infusions: PRN Meds:.sodium chloride, sodium chloride, alteplase, heparin, heparin, lidocaine (PF), lidocaine-prilocaine, pentafluoroprop-tetrafluoroeth   Sabra Heck  MD 10/27/2016, 8:44 AM

## 2016-10-27 NOTE — Consult Note (Addendum)
Reason for Consult: Hyperkalemia in patient with ESRD Referring Physician: Levy Pupa M.D. (CCM)  HPI:  26 year old African-American woman with past medical history significant for ESRD secondary to polycystic kidney disease status post failed renal transplant, cardiomyopathy status post AICD placement (EF 35%), history of multiple thoracic compression fractures with severe secondary hyperparathyroidism and lower extremity calciphylaxis with conservative management. Unfortunately, also with poor adherence to dialysis-has not been to her outpatient hemodialysis unit since discharge from the hospital about 2 weeks ago because she was not feeling well or feeling like getting up because of her lower extremity pain. Presented to [facility] [place] with complaints of sudden onset chest pain and cough/sputum production for the past few days and was found to have a potassium level of 7.7. Transferred for admission to [facility] for further management. At her last admission, she had a moderate-sized pericardial effusion noted along with evidence of pneumonia.  Dialysis prescription: Monday/Wednesday/Friday at [facility]. 4 hours, 180 dialyzer, blood flow rate 400, dialysate flow rate 800, EDW 49.5 kg, 2K/2.0 calcium, left femoral TDC. Aranesp 100 g every weekly, sodium thiosulfate 25 mg every hemodialysis and heparin bolus 2000 units every HD. No VDRA (Calciphylaxis)  Past Medical History:  Diagnosis Date  . 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 Anemia   . Anxiety   . Cardiomyopathy   . Cellulitis and abscess of face 03/22/2013  . CHF (congestive heart failure) (HCC)   . Chronic anticoagulation   . Chronic pain   . Depression   . DVT (deep venous thrombosis) (HCC) ~ 2014   BLE  . Dysrhythmia    at times per pt.  . End stage renal disease/ history renal transplant    s/p cadaveric renal transplant 07/2007 and transplant failure  08/2011, then transplant nephrectomy 08/2011.  Marland Kitchen ESRD (end stage renal disease) on dialysis [facility])    "MWF; [place] [facility] " (09/2016)  . Fracture, thoracic vertebra (HCC) 07/2016   "T7-T12"  . GERD (gastroesophageal reflux disease)   . H/O transfusion of packed red blood cells   . Heart murmur   . Hypertension   . Narcotic abuse, continuous   . Osteoporosis   . Pelvic fracture (HCC) 06/2015   "on the right"  . Pulmonary emboli (HCC) 01/2012   Bilateral, moderate clot burden, areas of pulmonary infarction and central necrosis  . Sickle cell anemia (HCC)     Past Surgical History:  Procedure Laterality Date  . AV FISTULA PLACEMENT Bilateral    "right side stopped working & never had it revised" (04/11/2016)  . AV FISTULA PLACEMENT Right 08/01/2016   Procedure: INSERTION OF ARTERIOVENOUS (AV) GORE-TEX GRAFT RIGHT ARM;  Surgeon: Chuck Hint, MD;  Location: [facility] OR;  Service: Vascular;  Laterality: Right;  . AV FISTULA PLACEMENT Right 08/31/2016   Procedure: INSERTION OF ARTERIOVENOUS (AV) GORE-TEX GRAFT- RIGHT FEMORAL LOOP GRAFT;  Surgeon: Maeola Harman, MD;  Location[facility] OR;  Service: Vascular;  Laterality: Right;  . AVGG REMOVAL Right 08/03/2016   Procedure: REMOVAL OF ARTERIOVENOUS GORETEX GRAFT (AVGG);  Surgeon: Maeola Harman, MD;  Location: [facility] OR;  Service: Vascular;  Laterality: Right;  . CARDIAC CATHETERIZATION    . IMPLANTABLE CARDIOVERTER DEFIBRILLATOR IMPLANT Right 12/2014  . INCISION AND DRAINAGE ABSCESS Right 03/21/2013   Procedure: INCISION AND DRAINAGE RIGHT CHEEK ABSCESS REMOVAL OF FOREIGN BODY;  Surgeon: Serena Colonel, MD;  Location: [facility] OR;  Service: ENT;  Laterality: Right;  .  INSERTION OF DIALYSIS CATHETER Right Mar 09, 2016   thigh, Dr. Wyn Quaker, [facility]  . INSERTION OF DIALYSIS CATHETER Left 09/02/2016   Procedure: Insertion of Dialysis Catheter Left Femoral Vein;  Surgeon: Maeola Harman, MD;  Location: [facility] OR;  Service: Vascular;   Laterality: Left;  . 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 NEPHRECTOMY Right     kidney placed in 2008, and body rejected in 2012   . PERIPHERAL VASCULAR CATHETERIZATION Left 03/09/2016   Procedure: A/V Shuntogram/Fistulagram;  Surgeon: Annice Needy, MD;  Location: [facility] INVASIVE CV LAB;  Service: Cardiovascular;  Laterality: Left;  . PERIPHERAL VASCULAR CATHETERIZATION N/A 05/09/2016   Procedure: Dialysis/Perma Catheter Removal;  Surgeon: Renford Dills, MD;  Location: [facility] INVASIVE CV LAB;  Service: Cardiovascular;  Laterality: N/A;  . PERIPHERAL VASCULAR CATHETERIZATION N/A 07/12/2016   Procedure: Dialysis/Perma Catheter Insertion;  Surgeon: Annice Needy, MD;  Location: [facility] INVASIVE CV LAB;  Service: Cardiovascular;  Laterality: N/A;  . PERIPHERAL VASCULAR CATHETERIZATION Right 07/31/2016   Procedure: Upper Extremity Venography;  Surgeon: Maeola Harman, MD;  Location: [facility] INVASIVE CV LAB;  Service: Cardiovascular;  Laterality: Right;  . REMOVAL OF GRAFT Right 09/15/2016   Procedure: REMOVAL OF RIGHT THIGH GRAFT;  Surgeon: Sherren Kerns, MD;  Location: [facility] OR;  Service: Vascular;  Laterality: Right;  . REVISON OF ARTERIOVENOUS FISTULA Left 03/22/2016   Procedure: REVISON OF ARTERIOVENOUS FISTULA ( ARTEGRAFT );  Surgeon: Annice Needy, MD;  Location: [facility] ORS;  Service: Vascular;  Laterality: Left;  . TONSILLECTOMY AND ADENOIDECTOMY  ~ 2000    Family History  Problem Relation Age of Onset  . Polycystic kidney disease Father   . Hypertension Father     Social History:  reports that she quit smoking about 10 months ago. Her smoking use included Cigarettes. She smoked 0.00 packs per day for 1.00 year. She has never used smokeless tobacco. She reports that she does not drink alcohol or use drugs.  Allergies:  Allergies  Allergen Reactions  . Aspirin Other (See Comments)    Interacts with Coreg  . Tramadol Anaphylaxis  . Vicodin [Hydrocodone-Acetaminophen] Hives  . Buprenorphine  Hcl Itching    Ok with oxycodone  . Iohexol Itching  . Morphine And Related Itching and Other (See Comments)    Ok with oxycodone     Medications:  Scheduled: . diphenhydrAMINE        CBC Latest Ref Rng & Units 10/26/2016 10/12/2016 10/11/2016  WBC 4.0 - 10.5 K/uL 7.6 7.3 7.2  Hemoglobin 12.0 - 15.0 g/dL 1.6(X) 0.9(U) 0.4(V)  Hematocrit 36.0 - 46.0 % 31.3(L) 29.3(L) 28.2(L)  Platelets 150 - 400 K/uL 288 312 268   BMP Latest Ref Rng & Units 10/26/2016 10/12/2016 10/11/2016  Glucose 65 - 99 mg/dL 96 40(J) 73  BUN 6 - 20 mg/dL 811(B) 9 12  Creatinine 0.44 - 1.00 mg/dL 14.78(G) 9.56(O) 1.30(Q)  Sodium 135 - 145 mmol/L 134(L) 140 138  Potassium 3.5 - 5.1 mmol/L 7.7(HH) 3.5 4.0  Chloride 101 - 111 mmol/L 93(L) 97(L) 95(L)  CO2 22 - 32 mmol/L 19(L) 21(L) 26  Calcium 8.9 - 10.3 mg/dL 10.8(H) 9.0 9.8     No results found.  Review of Systems  Constitutional: Positive for chills and malaise/fatigue. Negative for diaphoresis and fever.  HENT: Negative.   Eyes: Negative.   Respiratory: Positive for cough, sputum production and shortness of breath. Negative for hemoptysis.   Cardiovascular: Positive for chest pain and orthopnea.  Gastrointestinal: Positive for nausea. Negative for abdominal pain, diarrhea, heartburn and vomiting.  Genitourinary: Negative.   Musculoskeletal: Positive for back pain and myalgias.  Skin: Positive for itching. Negative for rash.  Neurological: Positive for weakness and headaches. Negative for sensory change and focal weakness.   Blood pressure (!) 155/130, pulse (!) 111, temperature 98 F (36.7 C), temperature source Oral, resp. rate (!) 24, height 5\' 3"  (1.6 m), weight 48.9 kg (107 lb 12.9 oz), SpO2 96 %. Physical Exam  Nursing note and vitals reviewed. Constitutional: She is oriented to person, place, and time. She appears well-developed and well-nourished.  HENT:  Head: Normocephalic and atraumatic.  Mouth/Throat: No oropharyngeal exudate.  Eyes: EOM  are normal. Pupils are equal, round, and reactive to light.  Neck: Normal range of motion. Neck supple. JVD present.  Cardiovascular: Regular rhythm.  Exam reveals friction rub.   Regular tachycardia  Respiratory: Effort normal. She has no wheezes. She has rales.  GI: Soft. Bowel sounds are normal. There is no tenderness. There is no rebound and no guarding.  Musculoskeletal: Normal range of motion. She exhibits edema.  Trace ankle edema with clean bilateral gauze dressing over shins  Neurological: She is alert and oriented to person, place, and time.  Skin: Skin is warm and dry. No rash noted. No erythema.    Assessment/Plan: 1. Hyperkalemia: Secondary to missed hemodialysis treatments, plan for emergent hemodialysis tonight. 2. Chest pain/cough/sputum production: Possibly from a combination of volume excess and clinically pericardial effusion-may need reevaluation of this if chest pain does not remit with hemodialysis/ultrafiltration. 3. Hypertension: Secondary to volume excess possibly poor adherence with antihypertensive regimen, monitor with ultrafiltration and hemodialysis. Antibiotics therapy resumed. 4. Anemia: Low hemoglobin levels, we'll give Aranesp and check iron studies. 5. Secondary hyperparathyroidism: Management complicated by poor compliance with hemodialysis and now development of calciphylaxis. Will check phosphorus with next labs and restart Fosrenol and Sensipar.  Valery Amedee K. 10/27/2016, 12:22 AM

## 2016-10-27 NOTE — Progress Notes (Signed)
Paged MD regarding high BP 149/113. Pt c/o severe pain in BLE related to sickle cell. Admin . PRN Dilaudid. Will recheck BP and awaiting response from MD  Larey Days, RN

## 2016-10-27 NOTE — Consult Note (Signed)
PULMONARY / CRITICAL CARE MEDICINE   Name: [name] Small MRN: [id number] DOB: 12/23/1990    ADMISSION DATE:  10/26/2016 CONSULTATION DATE:  10/26/2016  REFERRING MD:  Geoffery Lyons MD  CHIEF COMPLAINT:  Chest Pain  HISTORY OF PRESENT ILLNESS:   Mr. [name] Small is a 26 year old female with history of ESRD secondary to polycystic kidney disease, sickle cell anemia, hypertension, chronic systolic congestive heart failure with EF 30-35% s/p ICD, history of DVT and bilateral pulmonary embolism s/p IVC filter no longer on chronic anticoagulation, calciphylaxis with nonhealing bilateral lower extremity wounds, with T7, T8, and T12 vertebral compression fractures 07/2016 that presented to [facility] for chest pain. She reports that it has been over a week since her last dialysis session. She attributes noncompliance with dialysis to her lower extremity pain as it is difficult to get up and attend sessions. On her labs she was found to be hyperkalemic at 7.7 and was transferred to [facility] ICU.  PAST MEDICAL HISTORY :  She  has a past medical history of AICD (automatic cardioverter/defibrillator) present (12/16/14); Anemia; Anxiety; Cardiomyopathy; Cellulitis and abscess of face (03/22/2013); CHF (congestive heart failure) (HCC); Chronic anticoagulation; Chronic pain; Depression; DVT (deep venous thrombosis) (HCC) (~ 2014); Dysrhythmia; End stage renal disease/ history renal transplant; ESRD (end stage renal disease) on dialysis (HCC); Fracture, thoracic vertebra (HCC) (07/2016); GERD (gastroesophageal reflux disease); H/O transfusion of packed red blood cells; Heart murmur; Hypertension; Narcotic abuse, continuous; Osteoporosis; Pelvic fracture (HCC) (06/2015); Pulmonary emboli (HCC) (01/2012); and Sickle cell anemia (HCC).  PAST SURGICAL HISTORY: She  has a past surgical history that includes Nephrectomy (Right); AV fistula placement (Bilateral); Kidney transplant (2008); Incision and drainage abscess  (Right, 03/21/2013); Implantable cardioverter defibrillator implant (Right, 12/2014); Cardiac catheterization (Left, 03/09/2016); Insertion of dialysis catheter (Right, Mar 09, 2016); Cardiac catheterization; Revison of arteriovenous fistula (Left, 03/22/2016); Tonsillectomy and adenoidectomy (~ 2000); Cardiac catheterization (N/A, 05/09/2016); Cardiac catheterization (N/A, 07/12/2016); Cardiac catheterization (Right, 07/31/2016); AV fistula placement (Right, 08/01/2016); Arteriovenous goretex graft removal (Right, 08/03/2016); AV fistula placement (Right, 08/31/2016); Insertion of dialysis catheter (Left, 09/02/2016); and Removal of graft (Right, 09/15/2016).  Allergies  Allergen Reactions  . Aspirin Other (See Comments)    Interacts with Coreg  . Tramadol Anaphylaxis  . Vicodin [Hydrocodone-Acetaminophen] Hives  . Buprenorphine Hcl Itching    Ok with oxycodone  . Iohexol Itching  . Morphine And Related Itching and Other (See Comments)    Ok with oxycodone     No current facility-administered medications on file prior to encounter.    Current Outpatient Prescriptions on File Prior to Encounter  Medication Sig  . calcitonin, salmon, (MIACALCIN) 200 UNIT/ACT nasal spray Place 1 spray into alternate nostrils daily.  . carvedilol (COREG) 3.125 MG tablet Take 1 tablet (3.125 mg total) by mouth 2 (two) times daily.  Marland Kitchen ceFEPIme 2 g in dextrose 5 % 50 mL Inject 2 g into the vein every Monday, Wednesday, and Friday at 8 PM. Give with next HD on 12/22, 12/25, 12/27 and then stop  . cinacalcet (SENSIPAR) 30 MG tablet Take 3 tablets (90 mg total) by mouth daily with supper.  . colchicine 0.6 MG tablet Take 1 tablet (0.6 mg total) by mouth daily.  . famotidine (PEPCID) 20 MG tablet Take 1 tablet (20 mg total) by mouth 2 (two) times daily as needed for heartburn or indigestion.  . fentaNYL (DURAGESIC - DOSED MCG/HR) 100 MCG/HR Place 1 patch (100 mcg total) onto the skin every 3 (three) days.  Marland Kitchen  gabapentin  (NEURONTIN) 100 MG capsule Take 3 capsules (300 mg total) by mouth at bedtime. (Patient taking differently: Take 100-200 mg by mouth 2 (two) times daily. 100mg  at breakfast and 200mg  at lunch)  . ibuprofen (ADVIL,MOTRIN) 200 MG tablet Take 1 tablet (200 mg total) by mouth 3 (three) times daily.  Marland Kitchen lanthanum (FOSRENOL) 1000 MG chewable tablet Chew 1 tablet (1,000 mg total) by mouth 3 (three) times daily with meals.  . lidocaine (LIDODERM) 5 % Place 1 patch onto the skin daily. Remove & Discard patch within 12 hours or as directed by MD  . LORazepam (ATIVAN) 1 MG tablet Take 1 tablet (1 mg total) by mouth at bedtime. (Patient taking differently: Take 1 mg by mouth daily. )  . methocarbamol (ROBAXIN) 500 MG tablet Take 1 tablet (500 mg total) by mouth every 6 (six) hours as needed for muscle spasms.  . multivitamin (RENA-VIT) TABS tablet Take 1 tablet by mouth daily.  . Oxycodone HCl 10 MG TABS Take 3 tablets (30 mg total) by mouth every 4 (four) hours as needed.  . sertraline (ZOLOFT) 100 MG tablet Take 1 tablet (100 mg total) by mouth daily.  . vancomycin 500 mg in sodium chloride 0.9 % 100 mL Inject 500 mg into the vein every Monday, Wednesday, and Friday with hemodialysis. Give with next HD 12/22, 12/25, 12/27 and then stop    FAMILY HISTORY:  Her indicated that her mother is alive. She indicated that her father is alive. She indicated that her maternal grandmother is alive. She indicated that her maternal grandfather is alive. She indicated that her paternal grandmother is alive. She indicated that her paternal grandfather is alive.    SOCIAL HISTORY: She  reports that she quit smoking about 10 months ago. Her smoking use included Cigarettes. She smoked 0.00 packs per day for 1.00 year. She has never used smokeless tobacco. She reports that she does not drink alcohol or use drugs.  REVIEW OF SYSTEMS:   Review of Systems  Constitutional: Positive for malaise/fatigue.  Respiratory: Positive for  cough, sputum production and shortness of breath.   Cardiovascular: Positive for chest pain.  Gastrointestinal: Negative for nausea and vomiting.    SUBJECTIVE:  Patient states that she had sudden onset of chest pain that started yesterday and reports not feeling well for the past few days. She reports missing dialysis for over a week due difficulty walking from her chronic leg wounds.  VITAL SIGNS: BP (!) 155/130 (BP Location: Right Arm)   Pulse (!) 111   Temp 98 F (36.7 C) (Oral)   Resp (!) 24   Ht 5\' 3"  (1.6 m)   Wt 107 lb 12.9 oz (48.9 kg)   SpO2 96%   BMI 19.10 kg/m   HEMODYNAMICS:    VENTILATOR SETTINGS:    INTAKE / OUTPUT: No intake/output data recorded.  PHYSICAL EXAMINATION: General:  Young woman sitting up in bed in no acute distress speaking in complete sentences  Neuro:  She is alert and oriented to person, place, and time HEENT:  [place]/AT Cardiovascular:  Regular rhythm with friction rub Lungs:  Effort normal. No wheezes. Rales present Abdomen:  Non tender, non distended  Musculoskeletal:  Can move all extremities  Skin: bandaged wounds to lower extremities bilaterally  LABS:  BMET  Recent Labs Lab 10/26/16 1935  NA 134*  K 7.7*  CL 93*  CO2 19*  BUN 139*  CREATININE 13.60*  GLUCOSE 96    Electrolytes  Recent Labs Lab  10/26/16 1935  CALCIUM 10.8*    CBC  Recent Labs Lab 10/26/16 1935  WBC 7.6  HGB 9.8*  HCT 31.3*  PLT 288    Coag's No results for input(s): APTT, INR in the last 168 hours.  Sepsis Markers No results for input(s): LATICACIDVEN, PROCALCITON, O2SATVEN in the last 168 hours.  ABG No results for input(s): PHART, PCO2ART, PO2ART in the last 168 hours.  Liver Enzymes No results for input(s): AST, ALT, ALKPHOS, BILITOT, ALBUMIN in the last 168 hours.  Cardiac Enzymes No results for input(s): TROPONINI, PROBNP in the last 168 hours.  Glucose  Recent Labs Lab 10/26/16 2356  GLUCAP 54*    Imaging No  results found.   STUDIES:  EKG 1/4 >> sinus tachycardia with 1st degree AV block, prolonged QTc 536, no peaked T waves appreciated  CULTURES: None  ANTIBIOTICS: None  SIGNIFICANT EVENTS: None   LINES/TUBES: None  DISCUSSION: Mrs. [name] Small is a 26 year old female that presents chest pain that started yesterday. She has not attended her dialysis sessions for over a week due to chronic leg wounds that don't allow her to be mobile. She was found to have a potassium level of 7.7 and was transferred to the ICU for observation while she receives inpatient dialysis.  ASSESSMENT / PLAN:  PULMONARY A: Hx of Pulmonary Embolism P:   Heparin for prophylaxis  CARDIOVASCULAR A:  Hx of Pericardial effusion Chronic systolic congestive heart failure Hypertension likely secondary to volume overload P:  Emergent inpatient dialysis Cardiac monitor  RENAL A:   Hyperkalemia, prior to transfer received calcium gluconate and insulin ESRD 2/2 polycystic kidney disease P:   Emergent Inpatient dialysis Cardiac monitor  GASTROINTESTINAL A:   No issues P:   none  HEMATOLOGIC A:   Anemia of chronic disease Sickle cell anemia P:  Will receive Aranesp and check iron studies in dialysis  INFECTIOUS A:   No signs of infection P:   none  ENDOCRINE A:   Secondary hyperparathyroidism P:   Phosphorous level check and restart fosrenol and sensipar at dialysis   NEUROLOGIC A:   A&O x 3 P:   None  FAMILY  - Updates: No family at bedside  - Inter-disciplinary family meet or Palliative Care meeting due by: 11/02/16    Pulmonary and Critical Care Medicine [facility] Pager: [phone]  10/27/2016, 12:35 AM

## 2016-10-28 LAB — HEPATITIS B SURFACE ANTIGEN: Hepatitis B Surface Ag: NEGATIVE

## 2016-10-28 MED ORDER — LIDOCAINE HCL 2 % EX GEL: CUTANEOUS | 0 refills | Status: AC

## 2016-10-28 MED ORDER — HYDROMORPHONE HCL 1 MG/ML IJ SOLN
INTRAMUSCULAR | Status: AC
  Administered 2016-10-28: 1 mg via INTRAVENOUS
  Filled 2016-10-28: qty 1

## 2016-10-28 MED ORDER — FENTANYL 50 MCG/HR TD PT72
75.0000 ug | MEDICATED_PATCH | TRANSDERMAL | Status: DC
  Administered 2016-10-28: 75 ug via TRANSDERMAL
  Filled 2016-10-28: qty 1

## 2016-10-28 MED ORDER — COLLAGENASE 250 UNIT/GM EX OINT: TOPICAL_OINTMENT | CUTANEOUS | 0 refills | Status: AC

## 2016-10-28 MED ORDER — LIDOCAINE HCL 2 % EX GEL
1.0000 "application " | Freq: Every day | CUTANEOUS | Status: DC
  Filled 2016-10-28: qty 5

## 2016-10-28 MED ORDER — OXYCODONE HCL 10 MG PO TABS: 30.0000 mg | ORAL_TABLET | ORAL | 0 refills | Status: AC | PRN

## 2016-10-28 MED ORDER — FENTANYL 75 MCG/HR TD PT72
75.0000 ug | MEDICATED_PATCH | TRANSDERMAL | Status: DC
  Filled 2016-10-28: qty 1

## 2016-10-28 MED ORDER — DIPHENHYDRAMINE HCL 50 MG/ML IJ SOLN
6.2500 mg | Freq: Once | INTRAMUSCULAR | Status: AC
  Administered 2016-10-28: 15:00:00 via INTRAVENOUS
  Administered 2016-10-28: 6.25 mg via INTRAVENOUS

## 2016-10-28 MED ORDER — DIPHENHYDRAMINE HCL 50 MG/ML IJ SOLN
INTRAMUSCULAR | Status: AC
  Filled 2016-10-28: qty 1

## 2016-10-28 NOTE — Discharge Summary (Addendum)
Physician Discharge Summary  [name] Small AVW:[id number] DOB: Mar 03, 1991 DOA: 10/26/2016  PCP: Jeanann Lewandowsky, MD  Admit date: 10/26/2016 Discharge date: 10/28/2016  Admitted From: Home Disposition:  Home  Recommendations for Outpatient Follow-up:  1. Follow up with PCP in 1-2 weeks 2. Follow up with HD as previously scheduled MWF  3. Follow up with wound care clinic, ambulatory referral placed. Will prescribe Santyl ointment and lidocaine jelly.  4. Ask your PCP regarding referral for pain clinic to manage your chronic pain syndrome 5. Please obtain BMP in one week   Home Health: No  Equipment/Devices: None   Discharge Condition: Stable CODE STATUS: DNR  Diet recommendation: Renal  Brief/Interim Summary: [name] Small is a 26 y.o. 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    Upon presentation, her potassium was 7.7. She also had complaints of chest pain. She underwent emergent dialysis 1/5 with improvement. She underwent second hemodialysis session on 1/6. Her acute pain complaints had resolved, but continued to have lower extremity wound pains throughout hospitalization. She continued to ask for IV narcotics and fentanyl patch. No chest pain on day of discharge.   Discharge Diagnoses:  Principal Problem:   Hyperkalemia Active Problems:   Anemia of  chronic renal failure, stage 5 (HCC)   Chest pain   ESRD on dialysis (HCC)   AICD (automatic cardioverter/defibrillator) present   Chronic pain syndrome   Volume overload   Noncompliance of patient with renal dialysis (HCC)   Uremic pericarditis   Acute on chronic systolic CHF (congestive heart failure) (HCC)   H/O medication noncompliance   Calciphylaxis   Uncontrolled pain   Ulcer of left lower extremity, limited to breakdown of skin (HCC)   End-stage renal disease on hemodialysis (HCC)  Hyperkalemia -Secondary to missed dialysis sessions, resolved now  Chest pain -Secondary to volume overload, resolved with dialysis -Previous history of pericarditis, tx with colchicine, NSAIDs  -No complaints of chest pain today  ESRD, polycystic kidney disease, hx of transplant, on hemodialysis -Underwent dialysis 1/5 and 1/6. Patient to continue regular scheduled hemodialysis starting 1/8  Acute on chronic systolic heart failure, status post ICD -Stable now after HD   Bilateral lower leg cellulitis with history of calciphylaxis, POA -Appreciate wound nursing care -Continue topical treatments -Follow up with outpatient wound care center after discharge, referral placed   Chronic anemia of chronic kidney disease, sickle cell anemia -Stable  History of pulmonary embolism, DVT -IVC filter in place, poor candidate for blood thinner due to hx of noncompliance   Chronic pain syndrome -Patient needs to follow up outpatient for referral to pain clinic   Discharge Instructions  Discharge Instructions    Ambulatory referral to Wound Clinic    Complete by:  As directed    Increase activity slowly    Complete by:  As directed      Allergies as of 10/28/2016      Reactions   Aspirin Other (See Comments)   Interacts with Coreg   Tramadol Anaphylaxis   Vicodin [  hydrocodone-acetaminophen] Hives   Buprenorphine Hcl Itching, Other (See Comments)   Ok with oxycodone   Iohexol Itching    Morphine And Related Itching, Other (See Comments)   Ok with oxycodone      Medication List    STOP taking these medications   ceFEPIme 2 g in dextrose 5 % 50 mL   vancomycin 500 mg in sodium chloride 0.9 % 100 mL     TAKE these medications   calcitonin (salmon) 200 UNIT/ACT nasal spray Commonly known as:  MIACALCIN Place 1 spray into alternate nostrils daily.   carvedilol 3.125 MG tablet Commonly known as:  COREG Take 1 tablet (3.125 mg total) by mouth 2 (two) times daily.   cinacalcet 30 MG tablet Commonly known as:  SENSIPAR Take 3 tablets (90 mg total) by mouth daily with supper.   colchicine 0.6 MG tablet Take 1 tablet (0.6 mg total) by mouth daily.   collagenase ointment Commonly known as:  SANTYL Apply Santyl to right groin and leg wounds daily, then cover with moist 2X2 and foam dressings   famotidine 20 MG tablet Commonly known as:  PEPCID Take 1 tablet (20 mg total) by mouth 2 (two) times daily as needed for heartburn or indigestion.   fentaNYL 100 MCG/HR Commonly known as:  DURAGESIC - dosed mcg/hr Place 1 patch (100 mcg total) onto the skin every 3 (three) days.   gabapentin 100 MG capsule Commonly known as:  NEURONTIN Take 3 capsules (300 mg total) by mouth at bedtime. What changed:  how much to take  when to take this  additional instructions   ibuprofen 200 MG tablet Commonly known as:  ADVIL,MOTRIN Take 1 tablet (200 mg total) by mouth 3 (three) times daily. What changed:  when to take this  reasons to take this   lanthanum 1000 MG chewable tablet Commonly known as:  FOSRENOL Chew 1 tablet (1,000 mg total) by mouth 3 (three) times daily with meals.   lidocaine 2 % jelly Commonly known as:  XYLOCAINE To leg wounds during dressing change to decrease pain, use daily as needed   lidocaine 5 % Commonly known as:  LIDODERM Place 1 patch onto the skin daily. Remove & Discard patch within 12 hours or as directed by MD   LORazepam 1 MG  tablet Commonly known as:  ATIVAN Take 1 tablet (1 mg total) by mouth at bedtime. What changed:  when to take this   methocarbamol 500 MG tablet Commonly known as:  ROBAXIN Take 1 tablet (500 mg total) by mouth every 6 (six) hours as needed for muscle spasms.   multivitamin Tabs tablet Take 1 tablet by mouth daily.   Oxycodone HCl 10 MG Tabs Take 3 tablets (30 mg total) by mouth every 4 (four) hours as needed (for pain).   sertraline 100 MG tablet Commonly known as:  ZOLOFT Take 1 tablet (100 mg total) by mouth daily.      Follow-up Information    JEGEDE, OLUGBEMIGA, MD. Schedule an appointment as soon as possible for a visit in 1 week(s).   Specialty:  Internal Medicine Contact information: [address] [place] [address] [phone]          Allergies  Allergen Reactions  . Aspirin Other (See Comments)    Interacts with Coreg  . Tramadol Anaphylaxis  . Vicodin [Hydrocodone-Acetaminophen] Hives  . Buprenorphine Hcl Itching and Other (See Comments)    Ok with oxycodone  . Iohexol Itching  . Morphine And  Related Itching and Other (See Comments)    Ok with oxycodone     Consultations:  Nephrology   Procedures/Studies: No results found.    Discharge Exam: Vitals:   10/28/16 1630 10/28/16 1700  BP: 95/75 105/75  Pulse: 90 89  Resp: 16 17  Temp:     Vitals:   10/28/16 1530 10/28/16 1600 10/28/16 1630 10/28/16 1700  BP: 112/86 115/89 95/75 105/75  Pulse: 86 88 90 89  Resp:  15 16 17   Temp:      TempSrc:      SpO2:      Weight:      Height:        General: Pt is alert, awake, not in acute distress Cardiovascular: RRR, S1/S2 +, no rubs, no gallops Respiratory: CTA bilaterally, no wheezing, no rhonchi Abdominal: Soft, NT, ND, bowel sounds + Extremities: no edema, no cyanosis Skin: multiple lower extremity wounds covered     The results of significant diagnostics from this hospitalization (including imaging, microbiology, ancillary  and laboratory) are listed below for reference.     Microbiology: No results found for this or any previous visit (from the past 240 hour(s)).   Labs: BNP (last 3 results)  Recent Labs  04/11/16 1130 07/26/16 1803  BNP >4,500.0* >4,500.0*   Basic Metabolic Panel:  Recent Labs Lab 10/26/16 1935 10/27/16 0858  NA 134* 134*  K 7.7* 3.8  CL 93* 95*  CO2 19* 22  GLUCOSE 96 58*  BUN 139* 40*  CREATININE 13.60* 5.93*  CALCIUM 10.8* 10.3  PHOS  --  4.6   Liver Function Tests:  Recent Labs Lab 10/27/16 0858  ALBUMIN 3.2*   No results for input(s): LIPASE, AMYLASE in the last 168 hours. No results for input(s): AMMONIA in the last 168 hours. CBC:  Recent Labs Lab 10/26/16 1935  WBC 7.6  NEUTROABS 6.0  HGB 9.8*  HCT 31.3*  MCV 86.7  PLT 288   Cardiac Enzymes: No results for input(s): CKTOTAL, CKMB, CKMBINDEX, TROPONINI in the last 168 hours. BNP: Invalid input(s): POCBNP CBG:  Recent Labs Lab 10/26/16 2356 10/27/16 0316  GLUCAP 54* 77   D-Dimer No results for input(s): DDIMER in the last 72 hours. Hgb A1c No results for input(s): HGBA1C in the last 72 hours. Lipid Profile No results for input(s): CHOL, HDL, LDLCALC, TRIG, CHOLHDL, LDLDIRECT in the last 72 hours. Thyroid function studies No results for input(s): TSH, T4TOTAL, T3FREE, THYROIDAB in the last 72 hours.  Invalid input(s): FREET3 Anemia work up  Recent Labs  10/26/16 1935  RETICCTPCT 1.9   Urinalysis    Component Value Date/Time   COLORURINE YELLOW 01/14/2013 1639   APPEARANCEUR CLOUDY (A) 01/14/2013 1639   LABSPEC 1.008 01/14/2013 1639   PHURINE 8.0 01/14/2013 1639   GLUCOSEU NEGATIVE 01/14/2013 1639   HGBUR SMALL (A) 01/14/2013 1639   BILIRUBINUR NEGATIVE 01/14/2013 1639   KETONESUR NEGATIVE 01/14/2013 1639   PROTEINUR 30 (A) 01/14/2013 1639   UROBILINOGEN 0.2 01/14/2013 1639   NITRITE NEGATIVE 01/14/2013 1639   LEUKOCYTESUR MODERATE (A) 01/14/2013 1639   Sepsis  Labs Invalid input(s): PROCALCITONIN,  WBC,  LACTICIDVEN Microbiology No results found for this or any previous visit (from the past 240 hour(s)).   Time coordinating discharge: Over 30 minutes  SIGNED:  Noralee Stain, DO [facility] Pager [phone]  If 7PM-7AM, please contact night-coverage [email or web address] Password [email or web address] 10/28/2016, 5:12 PM

## 2016-10-28 NOTE — Progress Notes (Signed)
Pt was discharged home. Family came to pick patient up. IV was d/cd, paperwork was given to patient, and Fentanyl patch per MD order was admin. Prior to discharge. Pt was assisted to vehicle via wheelchair. This nurse made sure pt got into vehicle safely with family. Vitals were stable at discharge.   Larey Days, RN

## 2016-10-28 NOTE — Progress Notes (Signed)
Subjective:  Tolerated HD yest /  For HD again today sec to prior multiple missed txs/cos some leg  discomfort  Objective Vital signs in last 24 hours: Vitals:   10/27/16 1800 10/27/16 1900 10/27/16 2051 10/28/16 0534  BP: (!) 144/117 (!) 150/124 (!) 149/113 (!) 125/97  Pulse: 85  99 86  Resp: 19 14 15 16   Temp:   98.1 F (36.7 C) 98 F (36.7 C)  TempSrc:      SpO2: 100% 100% 100% 100%  Weight:   40.8 kg (90 lb)   Height:   5' (1.524 m)    Weight change: -5.249 kg (-11 lb 9.2 oz)  Physical Exam: General: alert Young AAF, OX3 , chronically ill NAD  But using some accessory abd /chest  Muscle for breathing  Heart: Reg tacy 101 rate , no rub, Lungs: Faint  r base rales/ using some accessory abd /chest  Muscle for breathing ,  Abdomen: soft Nt,ND Extremities:no pedal edema, Lower leg calf bilat dressing intact   Dialysis Access: L fem perm cath  NT  Problem/Plan: 1. Hyperkalemia: Secondary to missed hemodialysis treatments, Resolved with HD  2. Chest pain/cough/sputum production:  combination of volume excess/ CP improved with ONE tX  HD today  HO   pericardial effusion-in past sec missed HD = HD again today. 3. Hypertension: Secondary to volume excess possibly poor adherence with antihypertensive regimen,resolving  Now with ultrafiltration .  4. Bilateral lower Leg Cellulitis with HO calciphylaxis -Antibiotics therapy resumed. 4. Anemia:hbg 9.4 ,sec vol and missed ESA= we'll give Aranesp and check iron studies. 5. Secondary hyperparathyroidism: Management complicated by poor compliance with hemodialysis and now development of calciphylaxis. 4.6  phosphorus  restart Fosrenol and Sensipar. Ca 10.3  Corrected =10.9 use low ca bath  6. HO CM /AICD  Lenny Pastel, PA-C [facility] Beeper [phone] 10/28/2016,8:19 AM  LOS: 2 days   Labs: Basic Metabolic Panel:  Recent Labs Lab 10/26/16 1935 10/27/16 0858  NA 134* 134*  K 7.7* 3.8  CL 93* 95*  CO2 19* 22   GLUCOSE 96 58*  BUN 139* 40*  CREATININE 13.60* 5.93*  CALCIUM 10.8* 10.3  PHOS  --  4.6   Liver Function Tests:  Recent Labs Lab 10/27/16 0858  ALBUMIN 3.2*   CBC:  Recent Labs Lab 10/26/16 1935  WBC 7.6  NEUTROABS 6.0  HGB 9.8*  HCT 31.3*  MCV 86.7  PLT 288   Cardiac Enzymes: No results for input(s): CKTOTAL, CKMB, CKMBINDEX, TROPONINI in the last 168 hours. CBG:  Recent Labs Lab 10/26/16 2356 10/27/16 0316  GLUCAP 54* 77    Studies/Results: No results found. Medications:  . carvedilol  3.125 mg Oral BID WC  . cinacalcet  90 mg Oral Q supper  . collagenase   Topical Daily  . darbepoetin (ARANESP) injection - DIALYSIS  100 mcg Intravenous Q Fri-HD  . heparin  2,500 Units Dialysis Once in dialysis  . heparin subcutaneous  5,000 Units Subcutaneous Q8H  . lanthanum  1,000 mg Oral TID WC  . [START ON 10/30/2016] sodium thiosulfate infusion for calciphylaxis  25 g Intravenous Q M,W,F-HD     \

## 2016-10-28 NOTE — Progress Notes (Signed)
MD scheduled Coreg for high Bp. Pt takes this med at home.   Larey Days, RN

## 2016-10-28 NOTE — Progress Notes (Signed)
New Admission Note: Late Entry  Arrival Method: By bed as a transfer from another unit around 2000. Mental Orientation: Alert and oriented Telemetry: Box 6[address]04, CCMD notified. Pt has ICD Assessment: Completed Skin: Completed with Zenab M. Rn, refer to flowsheets Iv: Left AC S.L. Pain: Leg pain at an 8, Dilaudid admin.  Tubes: HD cath in left femoral. Safety Measures: Safety Fall Prevention Plan was given, discussed. Admission: Completed [address] Orientation: Patient has been orientated to the room, unit and the staff. Family: None at bedside  Orders have been reviewed and implemented. Will continue to monitor the patient. Call light has been placed within reach and bed alarm has been activated.   Alfonse Ras, RN  Phone Number: [phone]

## 2016-10-28 NOTE — Procedures (Signed)
Patient was seen on dialysis and the procedure was supervised.  BFR 375  Via PC BP is  119/86.   Patient appears to be tolerating treatment well- going home today   [name] Small A 10/28/2016

## 2016-11-09 ENCOUNTER — Telehealth: Payer: Self-pay

## 2016-11-09 ENCOUNTER — Other Ambulatory Visit: Payer: Self-pay | Admitting: Internal Medicine

## 2016-11-09 DIAGNOSIS — F411 Generalized anxiety disorder: Secondary | ICD-10-CM

## 2016-12-05 ENCOUNTER — Ambulatory Visit: Payer: Medicare Other | Admitting: Internal Medicine

## 2016-12-11 ENCOUNTER — Inpatient Hospital Stay (HOSPITAL_COMMUNITY)
Admission: EM | Admit: 2016-12-11 | Discharge: 2016-12-13 | DRG: 640 | Disposition: A | Discharge status: Hospice | Payer: Medicare Other | Attending: Family Medicine | Admitting: Family Medicine

## 2016-12-11 ENCOUNTER — Encounter (HOSPITAL_COMMUNITY): Payer: Self-pay | Admitting: Emergency Medicine

## 2016-12-11 DIAGNOSIS — Z86711 Personal history of pulmonary embolism: Secondary | ICD-10-CM

## 2016-12-11 DIAGNOSIS — M810 Age-related osteoporosis without current pathological fracture: Secondary | ICD-10-CM | POA: Diagnosis present

## 2016-12-11 DIAGNOSIS — Z888 Allergy status to other drugs, medicaments and biological substances status: Secondary | ICD-10-CM

## 2016-12-11 DIAGNOSIS — I82519 Chronic embolism and thrombosis of unspecified femoral vein: Secondary | ICD-10-CM | POA: Diagnosis present

## 2016-12-11 DIAGNOSIS — Y830 Surgical operation with transplant of whole organ as the cause of abnormal reaction of the patient, or of later complication, without mention of misadventure at the time of the procedure: Secondary | ICD-10-CM | POA: Diagnosis present

## 2016-12-11 DIAGNOSIS — R52 Pain, unspecified: Secondary | ICD-10-CM | POA: Diagnosis present

## 2016-12-11 DIAGNOSIS — I10 Essential (primary) hypertension: Secondary | ICD-10-CM | POA: Diagnosis present

## 2016-12-11 DIAGNOSIS — I5022 Chronic systolic (congestive) heart failure: Secondary | ICD-10-CM | POA: Diagnosis present

## 2016-12-11 DIAGNOSIS — L97909 Non-pressure chronic ulcer of unspecified part of unspecified lower leg with unspecified severity: Secondary | ICD-10-CM | POA: Diagnosis present

## 2016-12-11 DIAGNOSIS — Q613 Polycystic kidney, unspecified: Secondary | ICD-10-CM

## 2016-12-11 DIAGNOSIS — F411 Generalized anxiety disorder: Secondary | ICD-10-CM

## 2016-12-11 DIAGNOSIS — T8612 Kidney transplant failure: Secondary | ICD-10-CM | POA: Diagnosis present

## 2016-12-11 DIAGNOSIS — Z66 Do not resuscitate: Secondary | ICD-10-CM | POA: Diagnosis present

## 2016-12-11 DIAGNOSIS — I429 Cardiomyopathy, unspecified: Secondary | ICD-10-CM | POA: Diagnosis present

## 2016-12-11 DIAGNOSIS — Z8271 Family history of polycystic kidney: Secondary | ICD-10-CM

## 2016-12-11 DIAGNOSIS — D571 Sickle-cell disease without crisis: Secondary | ICD-10-CM | POA: Diagnosis present

## 2016-12-11 DIAGNOSIS — Z885 Allergy status to narcotic agent status: Secondary | ICD-10-CM

## 2016-12-11 DIAGNOSIS — Z886 Allergy status to analgesic agent status: Secondary | ICD-10-CM

## 2016-12-11 DIAGNOSIS — I132 Hypertensive heart and chronic kidney disease with heart failure and with stage 5 chronic kidney disease, or end stage renal disease: Secondary | ICD-10-CM | POA: Diagnosis present

## 2016-12-11 DIAGNOSIS — F329 Major depressive disorder, single episode, unspecified: Secondary | ICD-10-CM | POA: Diagnosis present

## 2016-12-11 DIAGNOSIS — Z515 Encounter for palliative care: Secondary | ICD-10-CM

## 2016-12-11 DIAGNOSIS — N186 End stage renal disease: Secondary | ICD-10-CM | POA: Diagnosis not present

## 2016-12-11 DIAGNOSIS — Z681 Body mass index (BMI) 19 or less, adult: Secondary | ICD-10-CM

## 2016-12-11 DIAGNOSIS — Z841 Family history of disorders of kidney and ureter: Secondary | ICD-10-CM

## 2016-12-11 DIAGNOSIS — R64 Cachexia: Secondary | ICD-10-CM | POA: Diagnosis present

## 2016-12-11 DIAGNOSIS — Z7189 Other specified counseling: Secondary | ICD-10-CM

## 2016-12-11 DIAGNOSIS — G8929 Other chronic pain: Secondary | ICD-10-CM | POA: Diagnosis present

## 2016-12-11 DIAGNOSIS — F419 Anxiety disorder, unspecified: Secondary | ICD-10-CM | POA: Diagnosis present

## 2016-12-11 DIAGNOSIS — Z5329 Procedure and treatment not carried out because of patient's decision for other reasons: Secondary | ICD-10-CM | POA: Diagnosis present

## 2016-12-11 DIAGNOSIS — Z87891 Personal history of nicotine dependence: Secondary | ICD-10-CM

## 2016-12-11 DIAGNOSIS — Z95810 Presence of automatic (implantable) cardiac defibrillator: Secondary | ICD-10-CM

## 2016-12-11 DIAGNOSIS — Z992 Dependence on renal dialysis: Secondary | ICD-10-CM

## 2016-12-11 MED ORDER — HYDROMORPHONE HCL 2 MG/ML IJ SOLN
1.5000 mg | Freq: Once | INTRAMUSCULAR | Status: AC
  Administered 2016-12-11: 1.5 mg via INTRAVENOUS
  Filled 2016-12-11: qty 1

## 2016-12-11 MED ORDER — DIPHENHYDRAMINE HCL 50 MG/ML IJ SOLN
25.0000 mg | Freq: Once | INTRAMUSCULAR | Status: AC
  Administered 2016-12-11: 25 mg via INTRAVENOUS
  Filled 2016-12-11: qty 1

## 2016-12-11 MED ORDER — HYDROMORPHONE HCL 2 MG/ML IJ SOLN
1.0000 mg | Freq: Once | INTRAMUSCULAR | Status: AC
  Administered 2016-12-11: 1 mg via INTRAVENOUS
  Filled 2016-12-11: qty 1

## 2016-12-11 MED ORDER — OXYCODONE-ACETAMINOPHEN 5-325 MG PO TABS
2.0000 | ORAL_TABLET | Freq: Once | ORAL | Status: AC
  Administered 2016-12-11: 2 via ORAL
  Filled 2016-12-11: qty 2

## 2016-12-11 MED ORDER — OXYCODONE HCL 5 MG PO TABS
10.0000 mg | ORAL_TABLET | Freq: Once | ORAL | Status: AC
  Administered 2016-12-11: 10 mg via ORAL
  Filled 2016-12-11: qty 2

## 2016-12-11 NOTE — H&P (Signed)
History and Physical    [name] Small NWG:[id number] DOB: [birth year] DOA: 12/11/2016  PCP: Jeanann Lewandowsky, MD   Patient coming from: Home.  Chief Complaint: Wound pain.  HPI: [name] Small is a 26 y.o. female with medical history significant of AICD in [place], cardiomyopathy, systolic CHF, anemia, anxiety, DVT, pulmonary embolism, depression, ESRD on hemodialysis, calciphylaxis, chronic nonhealing painful wounds, GERD, heart murmur, hypertension, narcotics abuse, osteoporosis who is coming to the emergency department with complaints of severe pain on her multiple buttocks and lower extremities wounds. Per patient, she ran out of analgesic medication 3 days ago. She does not want to continue to leave and pain and with so many medical issues. She does now one to continue hemodialysis, has spoken to her parents, has discussed this with her nephrologist and only wants palliative measures.  ED Course: After discussion with her nephrologist, Dr. Patria Mane has agreed to make the patient DO NOT RESUSCITATE. She is being admitted for pain management and palliative care evaluation in a.m.  Review of Systems: As per HPI otherwise 10 point review of systems negative.    Past Medical History:  Diagnosis Date  . 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 Anemia   . Anxiety   . Cardiomyopathy   . Cellulitis and abscess of face 03/22/2013  . CHF (congestive heart failure) (HCC)   . Chronic anticoagulation   . Chronic pain   . Depression   . DVT (deep venous thrombosis) (HCC) ~ 2014   BLE  . Dysrhythmia    at times per pt.  . 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  Marland Kitchen ESRD (end stage renal disease) on dialysis [facility])    "MWF; [facility] " (09/2016)  . Fracture, thoracic vertebra (HCC) 07/2016   "T7-T12"  . GERD  (gastroesophageal reflux disease)   . H/O transfusion of packed red blood cells   . Heart murmur   . Hypertension   . Narcotic abuse, continuous   . Osteoporosis   . Pelvic fracture (HCC) 06/2015   "on the right"  . Pulmonary emboli (HCC) 01/2012   Bilateral, moderate clot burden, areas of pulmonary infarction and central necrosis  . Sickle cell anemia (HCC)     Past Surgical History:  Procedure Laterality Date  . AV FISTULA PLACEMENT Bilateral    "right side stopped working & never had it revised" (04/11/2016)  . AV FISTULA PLACEMENT Right 08/01/2016   Procedure: INSERTION OF ARTERIOVENOUS (AV) GORE-TEX GRAFT RIGHT ARM;  Surgeon: Chuck Hint, MD;  Location: [facility] OR;  Service: Vascular;  Laterality: Right;  . AV FISTULA PLACEMENT Right 08/31/2016   Procedure: INSERTION OF ARTERIOVENOUS (AV) GORE-TEX GRAFT- RIGHT FEMORAL LOOP GRAFT;  Surgeon: Maeola Harman, MD;  Location: [facility] OR;  Service: Vascular;  Laterality: Right;  . AVGG REMOVAL Right 08/03/2016   Procedure: REMOVAL OF ARTERIOVENOUS GORETEX GRAFT (AVGG);  Surgeon: Maeola Harman, MD;  Location: [facility] OR;  Service: Vascular;  Laterality: Right;  . CARDIAC CATHETERIZATION    . IMPLANTABLE CARDIOVERTER DEFIBRILLATOR IMPLANT Right 12/2014  . INCISION AND DRAINAGE ABSCESS Right 03/21/2013   Procedure: INCISION AND DRAINAGE RIGHT CHEEK ABSCESS REMOVAL OF FOREIGN BODY;  Surgeon: Serena Colonel, MD;  Location: [facility] OR;  Service: ENT;  Laterality: Right;  . INSERTION OF DIALYSIS CATHETER Right Mar 09, 2016   thigh, Dr. Wyn Quaker, [facility]  . INSERTION OF DIALYSIS  CATHETER Left 09/02/2016   Procedure: Insertion of Dialysis Catheter Left Femoral Vein;  Surgeon: Maeola Harman, MD;  Location: [facility] OR;  Service: Vascular;  Laterality: Left;  . 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 NEPHRECTOMY Right     kidney placed in 2008, and body rejected in 2012   . PERIPHERAL VASCULAR CATHETERIZATION Left 03/09/2016   Procedure: A/V  Shuntogram/Fistulagram;  Surgeon: Annice Needy, MD;  Location: [facility] INVASIVE CV LAB;  Service: Cardiovascular;  Laterality: Left;  . PERIPHERAL VASCULAR CATHETERIZATION N/A 05/09/2016   Procedure: Dialysis/Perma Catheter Removal;  Surgeon: Renford Dills, MD;  Location: [facility] INVASIVE CV LAB;  Service: Cardiovascular;  Laterality: N/A;  . PERIPHERAL VASCULAR CATHETERIZATION N/A 07/12/2016   Procedure: Dialysis/Perma Catheter Insertion;  Surgeon: Annice Needy, MD;  Location: [facility] INVASIVE CV LAB;  Service: Cardiovascular;  Laterality: N/A;  . PERIPHERAL VASCULAR CATHETERIZATION Right 07/31/2016   Procedure: Upper Extremity Venography;  Surgeon: Maeola Harman, MD;  Location: [facility] INVASIVE CV LAB;  Service: Cardiovascular;  Laterality: Right;  . REMOVAL OF GRAFT Right 09/15/2016   Procedure: REMOVAL OF RIGHT THIGH GRAFT;  Surgeon: Sherren Kerns, MD;  Location: [facility] OR;  Service: Vascular;  Laterality: Right;  . REVISON OF ARTERIOVENOUS FISTULA Left 03/22/2016   Procedure: REVISON OF ARTERIOVENOUS FISTULA ( ARTEGRAFT );  Surgeon: Annice Needy, MD;  Location: [facility] ORS;  Service: Vascular;  Laterality: Left;  . TONSILLECTOMY AND ADENOIDECTOMY  ~ 2000     reports that she quit smoking about a year ago. Her smoking use included Cigarettes. She smoked 0.00 packs per day for 1.00 year. She has never used smokeless tobacco. She reports that she does not drink alcohol or use drugs.  Allergies  Allergen Reactions  . Aspirin Other (See Comments)    Interacts with Coreg  . Tramadol Anaphylaxis  . Vicodin [Hydrocodone-Acetaminophen] Hives  . Buprenorphine Hcl Itching and Other (See Comments)    Ok with oxycodone  . Iohexol Itching  . Morphine And Related Itching and Other (See Comments)    Ok with oxycodone     Family History  Problem Relation Age of Onset  . Polycystic kidney disease Father   . Hypertension Father     Prior to Admission medications   Medication Sig Start Date End Date  Taking? Authorizing Provider  calcitonin, salmon, (MIACALCIN) 200 UNIT/ACT nasal spray Place 1 spray into alternate nostrils daily. 09/01/16  Yes Daniel J Angiulli, PA-C  carvedilol (COREG) 3.125 MG tablet Take 1 tablet (3.125 mg total) by mouth 2 (two) times daily. 10/12/16  Yes Dorothea Ogle, MD  cinacalcet (SENSIPAR) 30 MG tablet Take 3 tablets (90 mg total) by mouth daily with supper. 10/12/16  Yes Dorothea Ogle, MD  colchicine 0.6 MG tablet Take 1 tablet (0.6 mg total) by mouth daily. 10/12/16  Yes Alison Murray, MD  collagenase Melburn Popper) ointment Apply Santyl to right groin and leg wounds daily, then cover with moist 2X2 and foam dressings 10/28/16  Yes Jennifer Chahn-Yang Choi, DO  famotidine (PEPCID) 20 MG tablet Take 1 tablet (20 mg total) by mouth 2 (two) times daily as needed for heartburn or indigestion. 09/06/16  Yes Ankit Karis Juba, MD  fentaNYL (DURAGESIC - DOSED MCG/HR) 100 MCG/HR Place 1 patch (100 mcg total) onto the skin every 3 (three) days. 10/11/16  Yes Marianne L York, PA-C  gabapentin (NEURONTIN) 100 MG capsule Take 3 capsules (300 mg total) by mouth at bedtime. Patient taking differently: Take  100-200 mg by mouth See admin instructions. 100mg  at breakfast and 200mg  at lunch 09/27/16  Yes Zannie Cove, MD  ibuprofen (ADVIL,MOTRIN) 200 MG tablet Take 1 tablet (200 mg total) by mouth 3 (three) times daily. Patient taking differently: Take 200 mg by mouth 3 (three) times daily as needed for headache or moderate pain.  10/12/16  Yes Alison Murray, MD  lanthanum (FOSRENOL) 1000 MG chewable tablet Chew 1 tablet (1,000 mg total) by mouth 3 (three) times daily with meals. 09/01/16  Yes Daniel J Angiulli, PA-C  lidocaine (LIDODERM) 5 % Place 1 patch onto the skin daily. Remove & Discard patch within 12 hours or as directed by MD 10/12/16  Yes Alison Murray, MD  lidocaine (XYLOCAINE) 2 % jelly To leg wounds during dressing change to decrease pain, use daily as needed 10/28/16  Yes Jennifer  Chahn-Yang Choi, DO  LORazepam (ATIVAN) 1 MG tablet Take 1 tablet (1 mg total) by mouth at bedtime. Patient taking differently: Take 1 mg by mouth daily.  09/05/16  Yes Quentin Angst, MD  methocarbamol (ROBAXIN) 500 MG tablet Take 1 tablet (500 mg total) by mouth every 6 (six) hours as needed for muscle spasms. 09/01/16  Yes Daniel J Angiulli, PA-C  multivitamin (RENA-VIT) TABS tablet Take 1 tablet by mouth daily. 09/01/16  Yes Daniel J Angiulli, PA-C  sertraline (ZOLOFT) 100 MG tablet Take 1 tablet (100 mg total) by mouth daily. 09/01/16  Yes Charlton Amor, PA-C    Physical Exam:  Constitutional: NAD, calm, comfortable Vitals:   12/11/16 1932 12/11/16 2000 12/11/16 2200 12/11/16 2300  BP:  (!) 165/136 (!) 155/119 (!) 152/123  Pulse:  120 (!) 56 (!) 58  Resp:  22 (!) 31 17  Temp:      TempSrc:      SpO2: 96% 98% 95% 99%   Eyes: PERRL, lids and conjunctivae normal ENMT: Mucous membranes are moist. Posterior pharynx clear of any exudate or lesions. Neck: normal, supple, no masses, no thyromegaly Respiratory: clear to auscultation bilaterally, no wheezing, no crackles. Normal respiratory effort. No accessory muscle use.  Cardiovascular: Regular rate and rhythm, no murmurs / rubs / gallops. No extremity edema. 2+ pedal pulses. No carotid bruits.  Abdomen: Soft, no tenderness, no masses palpated. No hepatosplenomegaly. Bowel sounds positive.  Musculoskeletal: no clubbing / cyanosis.Good ROM, no contractures. Normal muscle tone.  Skin: Multiple foul-smelling chronic nonhealing lesions on extremities and buttocks. Neurologic: CN 2-12 grossly intact. Sensation intact, DTR normal. Strength 5/5 in all 4.  Psychiatric: Normal judgment and insight. Alert and oriented x 3.    Labs on Admission: I have personally reviewed following labs and imaging studies  CBC: No results for input(s): WBC, NEUTROABS, HGB, HCT, MCV, PLT in the last 168 hours. Basic Metabolic Panel: No results for  input(s): NA, K, CL, CO2, GLUCOSE, BUN, CREATININE, CALCIUM, MG, PHOS in the last 168 hours. GFR: CrCl cannot be calculated (Patient's most recent lab result is older than the maximum 21 days allowed.). Liver Function Tests: No results for input(s): AST, ALT, ALKPHOS, BILITOT, PROT, ALBUMIN in the last 168 hours. No results for input(s): LIPASE, AMYLASE in the last 168 hours. No results for input(s): AMMONIA in the last 168 hours. Coagulation Profile: No results for input(s): INR, PROTIME in the last 168 hours. Cardiac Enzymes: No results for input(s): CKTOTAL, CKMB, CKMBINDEX, TROPONINI in the last 168 hours. BNP (last 3 results) No results for input(s): PROBNP in the last 8760 hours. HbA1C: No results  for input(s): HGBA1C in the last 72 hours. CBG: No results for input(s): GLUCAP in the last 168 hours. Lipid Profile: No results for input(s): CHOL, HDL, LDLCALC, TRIG, CHOLHDL, LDLDIRECT in the last 72 hours. Thyroid Function Tests: No results for input(s): TSH, T4TOTAL, FREET4, T3FREE, THYROIDAB in the last 72 hours. Anemia Panel: No results for input(s): VITAMINB12, FOLATE, FERRITIN, TIBC, IRON, RETICCTPCT in the last 72 hours. Urine analysis:    Component Value Date/Time   COLORURINE YELLOW 01/14/2013 1639   APPEARANCEUR CLOUDY (A) 01/14/2013 1639   LABSPEC 1.008 01/14/2013 1639   PHURINE 8.0 01/14/2013 1639   GLUCOSEU NEGATIVE 01/14/2013 1639   HGBUR SMALL (A) 01/14/2013 1639   BILIRUBINUR NEGATIVE 01/14/2013 1639   KETONESUR NEGATIVE 01/14/2013 1639   PROTEINUR 30 (A) 01/14/2013 1639   UROBILINOGEN 0.2 01/14/2013 1639   NITRITE NEGATIVE 01/14/2013 1639   LEUKOCYTESUR MODERATE (A) 01/14/2013 1639     Assessment/Plan Principal Problem:   Intractable pain Admit to MedSurg/ulceration. Will start her on hydromorphone PCA. Palliative care evaluation in a.m.  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    ESRD (end stage renal disease) on dialysis [facility]) Patient is declining further hemodialysis. Continue Sensipar 90 mg by mouth daily.    Chronic systolic heart failure (HCC) Compensated. Continue carvedilol 3.125 by mouth twice a day.    Essential hypertension Continue carvedilol. Monitor blood pressure.    History of deep vein thrombosis (DVT) of femoral vein (HCC) On file lactic Lovenox. The patient is declining further treatment.      DVT prophylaxis: Lovenox SQ. Code Status: DO NOT RESUSCITATE/DO NOT INTUBATE. Family Communication: Two of her relatives were present in the ED room. Disposition Plan: Admit for palliative care evaluation in a.m. Consults called: Nephrology.  Admission status: Observation/MedSurg.   Bobette Mo MD [facility] Pager [phone].  If 7PM-7AM, please contact night-coverage [email or web address] Password [facility]  12/11/2016, 11:31 PM

## 2016-12-11 NOTE — ED Triage Notes (Signed)
Per EMS- pt typically takes 10mg  oxycodone 3 times a day. Has been out of medication for three days. Pt has wounds to her left calf, right shin, right calf, right hip right buttocks. Pt does not have a wound care nurse. Pt dresses wounds herself but cannot see the ones on her buttocks. Foul odor to wounds and brown drainage from wounds. 24G PIV placed to L Hand. 50 mcg Fentanyl given by EMS. Pt rates pain 10/10.

## 2016-12-11 NOTE — ED Provider Notes (Signed)
[facility]-EMERGENCY DEPT Provider Note   CSN: [id number] Arrival date & time: 12/11/16  1818     History   Chief Complaint Chief Complaint  Patient presents with  . Wound Infection  . needs dialysis  . Medication Refill    HPI [name] Small is a 26 y.o. female.  HPI Patient is a history of end-stage renal disease and severe calciphylaxis who presents emergency department complaining of worsening pain from her chronic wounds.  She is a long discussion with her parents as well as her nephrologist and she has a determination that she no longer wants dialysis and wants full palliation at this time.  Her last dialysis was last Wednesday she did not dialyze on Friday or Monday because she states she can no longer sit through her dialysis sessions.  She has the full support of her team and her parents and her decision.  She is managed as an outpatient on oxycodone but reports this is no longer controlling the pain.   Past Medical History:  Diagnosis Date  . 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 Anemia   . Anxiety   . Cardiomyopathy   . Cellulitis and abscess of face 03/22/2013  . CHF (congestive heart failure) (HCC)   . Chronic anticoagulation   . Chronic pain   . Depression   . DVT (deep venous thrombosis) (HCC) ~ 2014   BLE  . Dysrhythmia    at times per pt.  . 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  Marland Kitchen ESRD (end stage renal disease) on dialysis [facility])    "MWF; [facility] " (09/2016)  . Fracture, thoracic vertebra (HCC) 07/2016   "T7-T12"  . GERD (gastroesophageal reflux disease)   . H/O transfusion of packed red blood cells   . Heart murmur   . Hypertension   . Narcotic abuse, continuous   . Osteoporosis   . Pelvic fracture (HCC) 06/2015   "on the right"  . Pulmonary emboli (HCC) 01/2012   Bilateral, moderate clot burden, areas of pulmonary infarction and central necrosis  . Sickle cell anemia [facility])     Patient Active Problem List   Diagnosis Date Noted  . End-stage renal disease on hemodialysis (HCC)   . Ulcer of left lower extremity, limited to breakdown of skin (HCC) 10/03/2016  . Uncontrolled pain   . Severe osteopetrosis   . Adjustment disorder with mixed anxiety and depressed mood   . Fall   . Tobacco abuse   . DNR (do not resuscitate)   . Pain of right lower extremity   . Palliative care by specialist   . Goals of care, counseling/discussion   . 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 Palliative care encounter   . Edema of right lower extremity 09/14/2016  . Leg edema, right 09/14/2016  . Generalized anxiety disorder 09/05/2016  . Labile blood pressure   . Closed nondisplaced fracture of pelvis (HCC)   . Other osteoporosis with current pathological fracture   . Recurrent major depressive disorder, in partial remission (HCC)   . 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 Anemia of chronic disease   . H/O medication noncompliance   . 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 Acute on chronic systolic CHF (congestive heart failure) (HCC) 08/16/2016  . Noncompliance of patient with renal dialysis (HCC) 08/12/2016  . Hyperparathyroidism due to end stage renal  disease on dialysis (HCC) 08/01/2016  . Osteoporosis 08/01/2016  . Thoracic compression fracture - T7, T8, T12 07/27/2016  . Essential hypertension 06/17/2016  . Depression with anxiety 06/17/2016  . Volume overload 04/14/2016  . Peripheral edema 04/14/2016  . NICM (nonischemic cardiomyopathy) (HCC) 04/14/2016  . Internal jugular (IJ) vein thromboembolism, acute (HCC) 04/14/2016  . Pericardial effusion 04/14/2016  . Hypertensive urgency 04/11/2016  . DVT (deep venous thrombosis), left 04/11/2016  . Chronic pain syndrome 02/29/2016  . Chronic systolic heart failure (HCC) 01/04/2016  . AICD (automatic  cardioverter/defibrillator) present 01/04/2016  . 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 Pathologic pelvic fracture 01/04/2016  . Depression 01/04/2016  . ESRD on dialysis (HCC)   . Elevated serum hCG   . Chest pain 10/11/2015  . Fluid overload 10/10/2015  . Hypertension 10/10/2015  . Anemia of chronic renal failure, stage 5 (HCC) 10/04/2015  . Thrombocytopenia (HCC) 10/04/2015  . Chronically Elevated troponin 03/03/2015  . Hyperkalemia 02/21/2015  . Pulmonary infarct (HCC) 02/07/2012  . ESRD (end stage renal disease) on dialysis (HCC) 02/03/2012  . PE (pulmonary embolism) 02/03/2012    Past Surgical History:  Procedure Laterality Date  . AV FISTULA PLACEMENT Bilateral    "right side stopped working & never had it revised" (04/11/2016)  . AV FISTULA PLACEMENT Right 08/01/2016   Procedure: INSERTION OF ARTERIOVENOUS (AV) GORE-TEX GRAFT RIGHT ARM;  Surgeon: Chuck Hint, MD;  Location[facility] OR;  Service: Vascular;  Laterality: Right;  . AV FISTULA PLACEMENT Right 08/31/2016   Procedure: INSERTION OF ARTERIOVENOUS (AV) GORE-TEX GRAFT- RIGHT FEMORAL LOOP GRAFT;  Surgeon: Maeola Harman, MD;  Location: [facility] OR;  Service: Vascular;  Laterality: Right;  . AVGG REMOVAL Right 08/03/2016   Procedure: REMOVAL OF ARTERIOVENOUS GORETEX GRAFT (AVGG);  Surgeon: Maeola Harman, MD;  Location[facility] OR;  Service: Vascular;  Laterality: Right;  . CARDIAC CATHETERIZATION    . IMPLANTABLE CARDIOVERTER DEFIBRILLATOR IMPLANT Right 12/2014  . INCISION AND DRAINAGE ABSCESS Right 03/21/2013   Procedure: INCISION AND DRAINAGE RIGHT CHEEK ABSCESS REMOVAL OF FOREIGN BODY;  Surgeon: Serena Colonel, MD;  Location: [facility] OR;  Service: ENT;  Laterality: Right;  . INSERTION OF DIALYSIS CATHETER Right Mar 09, 2016   thigh, Dr. Wyn Quaker, [facility]  . INSERTION OF DIALYSIS CATHETER Left 09/02/2016   Procedure: Insertion of Dialysis Catheter Left Femoral Vein;  Surgeon: Maeola Harman, MD;  Location: [facility]  OR;  Service: Vascular;  Laterality: Left;  . 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 NEPHRECTOMY Right     kidney placed in 2008, and body rejected in 2012   . PERIPHERAL VASCULAR CATHETERIZATION Left 03/09/2016   Procedure: A/V Shuntogram/Fistulagram;  Surgeon: Annice Needy, MD;  Location: [facility] INVASIVE CV LAB;  Service: Cardiovascular;  Laterality: Left;  . PERIPHERAL VASCULAR CATHETERIZATION N/A 05/09/2016   Procedure: Dialysis/Perma Catheter Removal;  Surgeon: Renford Dills, MD;  Location: [facility] INVASIVE CV LAB;  Service: Cardiovascular;  Laterality: N/A;  . PERIPHERAL VASCULAR CATHETERIZATION N/A 07/12/2016   Procedure: Dialysis/Perma Catheter Insertion;  Surgeon: Annice Needy, MD;  Location: [facility] INVASIVE CV LAB;  Service: Cardiovascular;  Laterality: N/A;  . PERIPHERAL VASCULAR CATHETERIZATION Right 07/31/2016   Procedure: Upper Extremity Venography;  Surgeon: Maeola Harman, MD;  Location: [facility] INVASIVE CV LAB;  Service: Cardiovascular;  Laterality: Right;  . REMOVAL OF GRAFT Right 09/15/2016   Procedure: REMOVAL OF RIGHT THIGH GRAFT;  Surgeon: Sherren Kerns, MD;  Location: [facility] OR;  Service: Vascular;  Laterality:  Right;  . REVISON OF ARTERIOVENOUS FISTULA Left 03/22/2016   Procedure: REVISON OF ARTERIOVENOUS FISTULA ( ARTEGRAFT );  Surgeon: Annice Needy, MD;  Location: [facility] ORS;  Service: Vascular;  Laterality: Left;  . TONSILLECTOMY AND ADENOIDECTOMY  ~ 2000    OB History    No data available       Home Medications    Prior to Admission medications   Medication Sig Start Date End Date Taking? Authorizing Provider  calcitonin, salmon, (MIACALCIN) 200 UNIT/ACT nasal spray Place 1 spray into alternate nostrils daily. 09/01/16  Yes Daniel J Angiulli, PA-C  carvedilol (COREG) 3.125 MG tablet Take 1 tablet (3.125 mg total) by mouth 2 (two) times daily. 10/12/16  Yes Dorothea Ogle, MD  cinacalcet (SENSIPAR) 30 MG tablet Take 3 tablets (90 mg total) by mouth daily with supper.  10/12/16  Yes Dorothea Ogle, MD  colchicine 0.6 MG tablet Take 1 tablet (0.6 mg total) by mouth daily. 10/12/16  Yes Alison Murray, MD  collagenase Melburn Popper) ointment Apply Santyl to right groin and leg wounds daily, then cover with moist 2X2 and foam dressings 10/28/16  Yes Jennifer Chahn-Yang Choi, DO  famotidine (PEPCID) 20 MG tablet Take 1 tablet (20 mg total) by mouth 2 (two) times daily as needed for heartburn or indigestion. 09/06/16  Yes Ankit Karis Juba, MD  fentaNYL (DURAGESIC - DOSED MCG/HR) 100 MCG/HR Place 1 patch (100 mcg total) onto the skin every 3 (three) days. 10/11/16  Yes Marianne L York, PA-C  gabapentin (NEURONTIN) 100 MG capsule Take 3 capsules (300 mg total) by mouth at bedtime. Patient taking differently: Take 100-200 mg by mouth See admin instructions. 100mg  at breakfast and 200mg  at lunch 09/27/16  Yes Zannie Cove, MD  ibuprofen (ADVIL,MOTRIN) 200 MG tablet Take 1 tablet (200 mg total) by mouth 3 (three) times daily. Patient taking differently: Take 200 mg by mouth 3 (three) times daily as needed for headache or moderate pain.  10/12/16  Yes Alison Murray, MD  lanthanum (FOSRENOL) 1000 MG chewable tablet Chew 1 tablet (1,000 mg total) by mouth 3 (three) times daily with meals. 09/01/16  Yes Daniel J Angiulli, PA-C  lidocaine (LIDODERM) 5 % Place 1 patch onto the skin daily. Remove & Discard patch within 12 hours or as directed by MD 10/12/16  Yes Alison Murray, MD  lidocaine (XYLOCAINE) 2 % jelly To leg wounds during dressing change to decrease pain, use daily as needed 10/28/16  Yes Jennifer Chahn-Yang Choi, DO  LORazepam (ATIVAN) 1 MG tablet Take 1 tablet (1 mg total) by mouth at bedtime. Patient taking differently: Take 1 mg by mouth daily.  09/05/16  Yes Quentin Angst, MD  methocarbamol (ROBAXIN) 500 MG tablet Take 1 tablet (500 mg total) by mouth every 6 (six) hours as needed for muscle spasms. 09/01/16  Yes Daniel J Angiulli, PA-C  multivitamin (RENA-VIT) TABS  tablet Take 1 tablet by mouth daily. 09/01/16  Yes Daniel J Angiulli, PA-C  sertraline (ZOLOFT) 100 MG tablet Take 1 tablet (100 mg total) by mouth daily. 09/01/16  Yes Mcarthur Rossetti Angiulli, PA-C    Family History Family History  Problem Relation Age of Onset  . Polycystic kidney disease Father   . Hypertension Father     Social History Social History  Substance Use Topics  . Smoking status: Former Smoker    Packs/day: 0.00    Years: 1.00    Types: Cigarettes    Quit date: 12/08/2015  . Smokeless  tobacco: Never Used  . Alcohol use No     Allergies   Aspirin; Tramadol; Vicodin [hydrocodone-acetaminophen]; Buprenorphine hcl; Iohexol; and Morphine and related   Review of Systems Review of Systems  All other systems reviewed and are negative.    Physical Exam Updated Vital Signs BP (!) 155/119   Pulse (!) 56   Temp 98.6 F (37 C) (Oral)   Resp (!) 31   SpO2 95%   Physical Exam  Constitutional: She is oriented to person, place, and time. She appears well-developed and well-nourished.  HENT:  Head: Normocephalic.  Eyes: EOM are normal.  Neck: Normal range of motion.  Pulmonary/Chest: Effort normal.  Abdominal: She exhibits no distension.  Musculoskeletal: Normal range of motion.  Neurological: She is alert and oriented to person, place, and time.  Skin:  Multiple foul-smelling chronic-appearing wounds of her legs and thighs.  Psychiatric: She has a normal mood and affect.  Nursing note and vitals reviewed.    ED Treatments / Results  Labs (all labs ordered are listed, but only abnormal results are displayed) Labs Reviewed - No data to display  EKG  EKG Interpretation  Date/Time:  Monday December 11 2016 18:20:53 EST Ventricular Rate:  117 PR Interval:    QRS Duration: 101 QT Interval:  371 QTC Calculation: 518 R Axis:   121 Text Interpretation:  Right and left arm electrode reversal, interpretation assumes no reversal Sinus or ectopic atrial  tachycardia Right axis deviation Borderline Q waves in lateral leads Nonspecific T abnormalities, lateral leads Prolonged QT interval No significant change was found Confirmed by Epsie Walthall  MD, Kortez Murtagh ([id number]) on 12/11/2016 6:37:07 PM       Radiology No results found.  Procedures Procedures (including critical care time)  Medications Ordered in ED Medications  HYDROmorphone (DILAUDID) injection 1.5 mg (not administered)  oxyCODONE (Oxy IR/ROXICODONE) immediate release tablet 10 mg (not administered)  HYDROmorphone (DILAUDID) injection 1 mg (1 mg Intravenous Given 12/11/16 1849)  oxyCODONE-acetaminophen (PERCOCET/ROXICET) 5-325 MG per tablet 2 tablet (2 tablets Oral Given 12/11/16 1942)  HYDROmorphone (DILAUDID) injection 1.5 mg (1.5 mg Intravenous Given 12/11/16 2036)  HYDROmorphone (DILAUDID) injection 1 mg (1 mg Intravenous Given 12/11/16 2134)  diphenhydrAMINE (BENADRYL) injection 25 mg (25 mg Intravenous Given 12/11/16 2134)     Initial Impression / Assessment and Plan / ED Course  I have reviewed the triage vital signs and the nursing notes.  Pertinent labs & imaging results that were available during my care of the patient were reviewed by me and considered in my medical decision making (see chart for details).     I discussed her case with her nephrologist Dr. Kathrene Bongo has had these discussions with the patient.  Her office will help arrange palliative care as an outpatient in the morning where able to control the patient's pain  11:08 PM Despite multiple doses of Dilaudid and oxycodone the patient continues to have severe pain.  Patient be admitted overnight to the hospital service for ongoing IV pain medication for symptom control.  Palliative care consult will be beneficial in the morning to help arrange outpatient palliative care as well as help with pain management  Final Clinical Impressions(s) / ED Diagnoses   Final diagnoses:  Calciphylaxis  ESRD (end stage renal  disease) (HCC)  Palliative care status    New Prescriptions New Prescriptions   No medications on file     Azalia Bilis, MD 12/11/16 2309

## 2016-12-11 NOTE — ED Notes (Signed)
Pt reports she is terminating dialysis and asking about her options regarding palliative care or hospice. MD aware.

## 2016-12-12 ENCOUNTER — Encounter (HOSPITAL_COMMUNITY): Payer: Self-pay | Admitting: Internal Medicine

## 2016-12-12 DIAGNOSIS — Z95810 Presence of automatic (implantable) cardiac defibrillator: Secondary | ICD-10-CM | POA: Diagnosis not present

## 2016-12-12 DIAGNOSIS — Z885 Allergy status to narcotic agent status: Secondary | ICD-10-CM | POA: Diagnosis not present

## 2016-12-12 DIAGNOSIS — Z515 Encounter for palliative care: Secondary | ICD-10-CM | POA: Diagnosis not present

## 2016-12-12 DIAGNOSIS — I82519 Chronic embolism and thrombosis of unspecified femoral vein: Secondary | ICD-10-CM | POA: Diagnosis present

## 2016-12-12 DIAGNOSIS — Y830 Surgical operation with transplant of whole organ as the cause of abnormal reaction of the patient, or of later complication, without mention of misadventure at the time of the procedure: Secondary | ICD-10-CM | POA: Diagnosis present

## 2016-12-12 DIAGNOSIS — Z841 Family history of disorders of kidney and ureter: Secondary | ICD-10-CM | POA: Diagnosis not present

## 2016-12-12 DIAGNOSIS — Z7189 Other specified counseling: Secondary | ICD-10-CM | POA: Diagnosis not present

## 2016-12-12 DIAGNOSIS — Z87891 Personal history of nicotine dependence: Secondary | ICD-10-CM | POA: Diagnosis not present

## 2016-12-12 DIAGNOSIS — T8612 Kidney transplant failure: Secondary | ICD-10-CM | POA: Diagnosis present

## 2016-12-12 DIAGNOSIS — Z681 Body mass index (BMI) 19 or less, adult: Secondary | ICD-10-CM | POA: Diagnosis not present

## 2016-12-12 DIAGNOSIS — I132 Hypertensive heart and chronic kidney disease with heart failure and with stage 5 chronic kidney disease, or end stage renal disease: Secondary | ICD-10-CM | POA: Diagnosis present

## 2016-12-12 DIAGNOSIS — L97909 Non-pressure chronic ulcer of unspecified part of unspecified lower leg with unspecified severity: Secondary | ICD-10-CM | POA: Diagnosis not present

## 2016-12-12 DIAGNOSIS — Z992 Dependence on renal dialysis: Secondary | ICD-10-CM | POA: Diagnosis not present

## 2016-12-12 DIAGNOSIS — Z888 Allergy status to other drugs, medicaments and biological substances status: Secondary | ICD-10-CM | POA: Diagnosis not present

## 2016-12-12 DIAGNOSIS — Z66 Do not resuscitate: Secondary | ICD-10-CM | POA: Diagnosis present

## 2016-12-12 DIAGNOSIS — I5022 Chronic systolic (congestive) heart failure: Secondary | ICD-10-CM | POA: Diagnosis present

## 2016-12-12 DIAGNOSIS — I10 Essential (primary) hypertension: Secondary | ICD-10-CM | POA: Diagnosis not present

## 2016-12-12 DIAGNOSIS — Z8271 Family history of polycystic kidney: Secondary | ICD-10-CM | POA: Diagnosis not present

## 2016-12-12 DIAGNOSIS — M810 Age-related osteoporosis without current pathological fracture: Secondary | ICD-10-CM | POA: Diagnosis present

## 2016-12-12 DIAGNOSIS — N186 End stage renal disease: Secondary | ICD-10-CM | POA: Diagnosis present

## 2016-12-12 DIAGNOSIS — Z886 Allergy status to analgesic agent status: Secondary | ICD-10-CM | POA: Diagnosis not present

## 2016-12-12 DIAGNOSIS — F419 Anxiety disorder, unspecified: Secondary | ICD-10-CM | POA: Diagnosis present

## 2016-12-12 DIAGNOSIS — G8929 Other chronic pain: Secondary | ICD-10-CM | POA: Diagnosis present

## 2016-12-12 DIAGNOSIS — R64 Cachexia: Secondary | ICD-10-CM | POA: Diagnosis present

## 2016-12-12 DIAGNOSIS — R52 Pain, unspecified: Secondary | ICD-10-CM | POA: Diagnosis not present

## 2016-12-12 DIAGNOSIS — Q613 Polycystic kidney, unspecified: Secondary | ICD-10-CM | POA: Diagnosis not present

## 2016-12-12 DIAGNOSIS — D571 Sickle-cell disease without crisis: Secondary | ICD-10-CM | POA: Diagnosis present

## 2016-12-12 DIAGNOSIS — F329 Major depressive disorder, single episode, unspecified: Secondary | ICD-10-CM | POA: Diagnosis present

## 2016-12-12 DIAGNOSIS — I429 Cardiomyopathy, unspecified: Secondary | ICD-10-CM | POA: Diagnosis present

## 2016-12-12 MED ORDER — LIDOCAINE 5 % EX PTCH
1.0000 | MEDICATED_PATCH | CUTANEOUS | Status: DC
  Filled 2016-12-12: qty 1

## 2016-12-12 MED ORDER — NALOXONE HCL 0.4 MG/ML IJ SOLN: 0.4000 mg | INTRAMUSCULAR | Status: DC | PRN

## 2016-12-12 MED ORDER — DIPHENHYDRAMINE HCL 50 MG/ML IJ SOLN
12.5000 mg | Freq: Once | INTRAMUSCULAR | Status: AC
  Administered 2016-12-12: 12.5 mg via INTRAVENOUS
  Filled 2016-12-12: qty 1

## 2016-12-12 MED ORDER — HALOPERIDOL 0.5 MG PO TABS
0.5000 mg | ORAL_TABLET | ORAL | Status: DC | PRN
  Filled 2016-12-12: qty 1

## 2016-12-12 MED ORDER — LORAZEPAM 2 MG/ML IJ SOLN
1.0000 mg | INTRAMUSCULAR | Status: DC | PRN
  Administered 2016-12-12: 1 mg via INTRAVENOUS
  Filled 2016-12-12: qty 1

## 2016-12-12 MED ORDER — GLYCOPYRROLATE 0.2 MG/ML IJ SOLN
0.2000 mg | INTRAMUSCULAR | Status: DC | PRN
Start: 2016-12-12 — End: 2016-12-13
  Filled 2016-12-12: qty 1

## 2016-12-12 MED ORDER — SODIUM CHLORIDE 0.9% FLUSH: 9.0000 mL | INTRAVENOUS | Status: DC | PRN

## 2016-12-12 MED ORDER — DIPHENHYDRAMINE HCL 50 MG/ML IJ SOLN
25.0000 mg | INTRAMUSCULAR | Status: DC | PRN
  Administered 2016-12-12 – 2016-12-13 (×4): 25 mg via INTRAVENOUS
  Filled 2016-12-12 (×4): qty 1

## 2016-12-12 MED ORDER — POLYVINYL ALCOHOL 1.4 % OP SOLN
1.0000 [drp] | Freq: Four times a day (QID) | OPHTHALMIC | Status: DC | PRN
  Filled 2016-12-12: qty 15

## 2016-12-12 MED ORDER — ONDANSETRON HCL 4 MG/2ML IJ SOLN
4.0000 mg | Freq: Four times a day (QID) | INTRAMUSCULAR | Status: DC | PRN
  Administered 2016-12-13: 4 mg via INTRAVENOUS
  Filled 2016-12-12: qty 2

## 2016-12-12 MED ORDER — SODIUM CHLORIDE 0.9% FLUSH
9.0000 mL | INTRAVENOUS | Status: DC | PRN
Start: 2016-12-12 — End: 2016-12-12

## 2016-12-12 MED ORDER — HALOPERIDOL LACTATE 2 MG/ML PO CONC
0.5000 mg | ORAL | Status: DC | PRN
  Filled 2016-12-12: qty 0.3

## 2016-12-12 MED ORDER — ACETAMINOPHEN 325 MG PO TABS: 650.0000 mg | ORAL_TABLET | Freq: Four times a day (QID) | ORAL | Status: DC | PRN

## 2016-12-12 MED ORDER — DIPHENHYDRAMINE HCL 50 MG/ML IJ SOLN: 25.0000 mg | Freq: Four times a day (QID) | INTRAMUSCULAR | Status: DC | PRN

## 2016-12-12 MED ORDER — FAMOTIDINE 20 MG PO TABS: 20.0000 mg | ORAL_TABLET | Freq: Two times a day (BID) | ORAL | Status: DC | PRN

## 2016-12-12 MED ORDER — POLYETHYLENE GLYCOL 3350 17 G PO PACK
17.0000 g | PACK | Freq: Every day | ORAL | Status: DC
Start: 2016-12-12 — End: 2016-12-13
  Filled 2016-12-12 (×2): qty 1

## 2016-12-12 MED ORDER — METHOCARBAMOL 500 MG PO TABS: 500.0000 mg | ORAL_TABLET | Freq: Four times a day (QID) | ORAL | Status: DC | PRN

## 2016-12-12 MED ORDER — DIPHENHYDRAMINE HCL 50 MG/ML IJ SOLN
12.5000 mg | Freq: Once | INTRAMUSCULAR | Status: AC
  Administered 2016-12-12: 12.5 mg via INTRAVENOUS

## 2016-12-12 MED ORDER — GLYCOPYRROLATE 1 MG PO TABS
1.0000 mg | ORAL_TABLET | ORAL | Status: DC | PRN
Start: 2016-12-12 — End: 2016-12-13
  Filled 2016-12-12: qty 1

## 2016-12-12 MED ORDER — HYDROMORPHONE 1 MG/ML IV SOLN: INTRAVENOUS | Status: DC

## 2016-12-12 MED ORDER — GABAPENTIN 100 MG PO CAPS
200.0000 mg | ORAL_CAPSULE | Freq: Every day | ORAL | Status: DC
  Administered 2016-12-12 – 2016-12-13 (×2): 200 mg via ORAL
  Filled 2016-12-12 (×2): qty 2

## 2016-12-12 MED ORDER — DIPHENHYDRAMINE HCL 12.5 MG/5ML PO ELIX: 12.5000 mg | ORAL_SOLUTION | Freq: Four times a day (QID) | ORAL | Status: DC | PRN

## 2016-12-12 MED ORDER — DIPHENHYDRAMINE HCL 50 MG/ML IJ SOLN
25.0000 mg | Freq: Four times a day (QID) | INTRAMUSCULAR | Status: DC | PRN
  Filled 2016-12-12: qty 1

## 2016-12-12 MED ORDER — RENA-VITE PO TABS: 1.0000 | ORAL_TABLET | Freq: Every day | ORAL | Status: DC

## 2016-12-12 MED ORDER — CARVEDILOL 3.125 MG PO TABS
3.1250 mg | ORAL_TABLET | Freq: Two times a day (BID) | ORAL | Status: DC
  Administered 2016-12-12: 3.125 mg via ORAL
  Filled 2016-12-12: qty 1

## 2016-12-12 MED ORDER — LANTHANUM CARBONATE 500 MG PO CHEW
1000.0000 mg | CHEWABLE_TABLET | Freq: Three times a day (TID) | ORAL | Status: DC
  Filled 2016-12-12: qty 2

## 2016-12-12 MED ORDER — HYDROMORPHONE 1 MG/ML IV SOLN
INTRAVENOUS | Status: DC
  Administered 2016-12-12: 1.8 mg via INTRAVENOUS
  Administered 2016-12-12: 01:00:00 via INTRAVENOUS
  Filled 2016-12-12: qty 25

## 2016-12-12 MED ORDER — CALCITONIN (SALMON) 200 UNIT/ACT NA SOLN
1.0000 | Freq: Every day | NASAL | Status: DC
  Administered 2016-12-12: 1 via NASAL
  Filled 2016-12-12: qty 3.7

## 2016-12-12 MED ORDER — LORAZEPAM 2 MG/ML PO CONC: 1.0000 mg | ORAL | Status: DC | PRN

## 2016-12-12 MED ORDER — NON FORMULARY: 1.0000 g | Status: DC | PRN

## 2016-12-12 MED ORDER — SERTRALINE HCL 100 MG PO TABS
100.0000 mg | ORAL_TABLET | Freq: Every day | ORAL | Status: DC
  Administered 2016-12-12 – 2016-12-13 (×2): 100 mg via ORAL
  Filled 2016-12-12 (×2): qty 1

## 2016-12-12 MED ORDER — COLCHICINE 0.6 MG PO TABS
0.6000 mg | ORAL_TABLET | Freq: Every day | ORAL | Status: DC
  Administered 2016-12-12: 0.6 mg via ORAL
  Filled 2016-12-12: qty 1

## 2016-12-12 MED ORDER — LORAZEPAM 1 MG PO TABS
1.0000 mg | ORAL_TABLET | ORAL | Status: DC | PRN
Start: 2016-12-12 — End: 2016-12-13

## 2016-12-12 MED ORDER — NONFORMULARY OR COMPOUNDED ITEM
1.0000 g | Status: DC | PRN
  Filled 2016-12-12: qty 1

## 2016-12-12 MED ORDER — HALOPERIDOL LACTATE 5 MG/ML IJ SOLN
0.5000 mg | INTRAMUSCULAR | Status: DC | PRN
Start: 2016-12-12 — End: 2016-12-13

## 2016-12-12 MED ORDER — DIPHENHYDRAMINE HCL 12.5 MG/5ML PO ELIX: 25.0000 mg | ORAL_SOLUTION | Freq: Four times a day (QID) | ORAL | Status: DC | PRN

## 2016-12-12 MED ORDER — HYDROMORPHONE 1 MG/ML IV SOLN
INTRAVENOUS | Status: DC
  Administered 2016-12-12: 5.46 mg via INTRAVENOUS
  Administered 2016-12-12: 8.08 mg via INTRAVENOUS
  Administered 2016-12-12 – 2016-12-13 (×3): via INTRAVENOUS
  Administered 2016-12-13: 6.02 mg via INTRAVENOUS
  Administered 2016-12-13: 5.14 mg via INTRAVENOUS
  Filled 2016-12-12 (×3): qty 25

## 2016-12-12 MED ORDER — CAMPHOR-MENTHOL 0.5-0.5 % EX LOTN
TOPICAL_LOTION | CUTANEOUS | Status: DC | PRN
  Filled 2016-12-12: qty 222

## 2016-12-12 MED ORDER — ONDANSETRON 4 MG PO TBDP: 4.0000 mg | ORAL_TABLET | Freq: Four times a day (QID) | ORAL | Status: DC | PRN

## 2016-12-12 MED ORDER — GABAPENTIN 100 MG PO CAPS
100.0000 mg | ORAL_CAPSULE | Freq: Every day | ORAL | Status: DC
  Administered 2016-12-12 – 2016-12-13 (×2): 100 mg via ORAL
  Filled 2016-12-12 (×2): qty 1

## 2016-12-12 MED ORDER — BIOTENE DRY MOUTH MT LIQD: 15.0000 mL | OROMUCOSAL | Status: DC | PRN

## 2016-12-12 MED ORDER — ONDANSETRON HCL 4 MG/2ML IJ SOLN: 4.0000 mg | Freq: Four times a day (QID) | INTRAMUSCULAR | Status: DC | PRN

## 2016-12-12 MED ORDER — ACETAMINOPHEN 650 MG RE SUPP: 650.0000 mg | Freq: Four times a day (QID) | RECTAL | Status: DC | PRN

## 2016-12-12 MED ORDER — CINACALCET HCL 30 MG PO TABS: 90.0000 mg | ORAL_TABLET | Freq: Every day | ORAL | Status: DC

## 2016-12-12 MED ORDER — DIPHENHYDRAMINE HCL 50 MG/ML IJ SOLN
12.5000 mg | Freq: Four times a day (QID) | INTRAMUSCULAR | Status: DC | PRN
  Administered 2016-12-12: 12.5 mg via INTRAVENOUS
  Filled 2016-12-12: qty 1

## 2016-12-12 NOTE — Progress Notes (Signed)
Delivered hard rx for ketamine gel to inpatient pharmacy for compounding.  Margret Chance Andrewjames Weirauch, RN, BSN, [facility] 12/12/2016 4:16 PM Cell [phone] 8:00-4:00 Monday-Friday Office [phone]

## 2016-12-12 NOTE — Consult Note (Signed)
WOC Nurse wound consult note Reason for Consult: Patient known to our team from her previous admissions. She has been admitted with chronic pain from her wounds Wound type: Calciphylaxis effected areas include: right and left lower extremities, right hip, right upper posterior thigh Pressure Injury POA: No Measurement: I was only able to measure one area on the left pretibial: 3.5cm x 2.5cm x 0; 100% eschar.  And the left posterior; 4cm x 3cm x 0.1cm - the others are too painful to assess and the patient request I not remove the dressings. She will have a palliative meeting today to address her goals of care which at this point seem to be comfort care only. I discussed dressing options with the patient and her family.  I will write these orders for now and will follow along to see what the patient and palliative team decide.   Dressing procedure/placement/frequency: single layer of xeroform as a non adherent and antibacterial to wounds. Cover with dry dressing, attempt to secure with no tape using mesh underwear and stockinette.  WOC nurse will follow along with you for support with wound care as needed. Izayiah Tibbitts [facility] MSN, RN, [place], CNS [phone]

## 2016-12-12 NOTE — Progress Notes (Signed)
PROGRESS NOTE    [name] Small  YVO:[id number] DOB: 1991-08-24 DOA: 12/11/2016 PCP: [name] Lewandowsky, MD   Brief Narrative: 26 year old female with history of polycystic kidney disease leading to ESRD, failed kidney transplant, cardiomyopathy, chronic systolic CHF, anemia, anxiety, DVT PE, depression, calciphylaxis with chronic nonhealing painful wounds in her lower extremities and back, hypertension, osteoporosis presented to the ER with severe pain in her lower extremities wound. Admitted for pain management and palliative consult.  Assessment & Plan:   #intractable pain/pain management: -Currently on Dilaudid PCA pump. Palliative care consult requested in the morning. Continue supportive care. -Adjusted the dose of Benadryl for generalized skin itching  # History of polycystic kidney disease leading to ESRD, failed kidney transplant, on hemodialysis. The patient's course complicated by calciphylaxis and chronic nonhealing/painful wound in her lower extremities. -Apparently, patient and family had long discussion with her nephrologist. She is declining further hemodialysis treatment and wanted comfort measures and pain management. As per ER note, the ER physician discussed with her nephrologist ( [name] Small)  agreed for DO NOT RESUSCITATE and palliative measures. The last hemodialysis treatment was on 2/14 wednesday but patient did not go on Friday. -I also discussed with the patient and her parents (both father and mother) at bedside. They clearly do not want to pursue any aggressive measures are hemodialysis treatment. They were asking for palliative care consult and hospice care. Emotional support provided to the patient and family's.  #Chronic systolic congestive heart failure: Currently compensated. Continue Coreg  #Essential hypertension: Monitor blood pressure  #History of DVT: On comfort measures. Not on anticoagulation at home   Principal Problem:   Intractable  pain Active Problems:   ESRD (end stage renal disease) on dialysis (HCC)   Chronic systolic heart failure (HCC)   Essential hypertension   Chronic deep vein thrombosis (DVT) of femoral vein (HCC)   Calciphylaxis of lower extremity with nonhealing ulcer (HCC)  DVT prophylaxis: Comfort measures Code Status: DO NOT RESUSCITATE Family Communication: Discussed with the patient's father and mother at bedside Disposition Plan: Likely discharge home with home hospice.    Consultants:   Palliative care  ER contacted patient's nephrologist  Procedures: None Antimicrobials: None  Subjective: Patient was seen and examined at bedside. Patient reported severe generalized skin itching. Reported pain is better with IV Dilaudid. No headache or dizziness chest pain, nausea or vomiting.  Objective: Vitals:   12/12/16 0351 12/12/16 0612 12/12/16 0800 12/12/16 0947  BP:  (!) 145/105  (!) 135/105  Pulse:  96  (!) 51  Resp: 19 19 (!) 29 16  Temp:  98.2 F (36.8 C)  98.2 F (36.8 C)  TempSrc:  Oral  Oral  SpO2: 97% 96% 98% 97%  Weight:      Height:        Intake/Output Summary (Last 24 hours) at 12/12/16 1138 Last data filed at 12/12/16 8638  Gross per 24 hour  Intake            122.2 ml  Output                0 ml  Net            122.2 ml   Filed Weights   12/12/16 0055  Weight: 47.9 kg (105 lb 9.6 oz)    Examination:  General exam: Ill-looking female lying on bed, not in distress Respiratory system: Clear to auscultation. Respiratory effort normal.  Cardiovascular system: S1 & S2 heard, RRR.  No pedal edema. Gastrointestinal system: Abdomen  is nondistended, soft and nontender. Normal bowel sounds heard. Central nervous system: Alert and oriented.  Extremities: Multiple lower extremities chronic nonhealing calciphylaxis wounds Psychiatry: Judgement and insight appear normal. Mood & affect appropriate.     Data Reviewed: I have personally reviewed following labs and imaging  studies  CBC: No results for input(s): WBC, NEUTROABS, HGB, HCT, MCV, PLT in the last 168 hours. Basic Metabolic Panel: No results for input(s): NA, K, CL, CO2, GLUCOSE, BUN, CREATININE, CALCIUM, MG, PHOS in the last 168 hours. GFR: CrCl cannot be calculated (Patient's most recent lab result is older than the maximum 21 days allowed.). Liver Function Tests: No results for input(s): AST, ALT, ALKPHOS, BILITOT, PROT, ALBUMIN in the last 168 hours. No results for input(s): LIPASE, AMYLASE in the last 168 hours. No results for input(s): AMMONIA in the last 168 hours. Coagulation Profile: No results for input(s): INR, PROTIME in the last 168 hours. Cardiac Enzymes: No results for input(s): CKTOTAL, CKMB, CKMBINDEX, TROPONINI in the last 168 hours. BNP (last 3 results) No results for input(s): PROBNP in the last 8760 hours. HbA1C: No results for input(s): HGBA1C in the last 72 hours. CBG: No results for input(s): GLUCAP in the last 168 hours. Lipid Profile: No results for input(s): CHOL, HDL, LDLCALC, TRIG, CHOLHDL, LDLDIRECT in the last 72 hours. Thyroid Function Tests: No results for input(s): TSH, T4TOTAL, FREET4, T3FREE, THYROIDAB in the last 72 hours. Anemia Panel: No results for input(s): VITAMINB12, FOLATE, FERRITIN, TIBC, IRON, RETICCTPCT in the last 72 hours. Sepsis Labs: No results for input(s): PROCALCITON, LATICACIDVEN in the last 168 hours.  No results found for this or any previous visit (from the past 240 hour(s)).       Radiology Studies: No results found.      Scheduled Meds: . gabapentin  100 mg Oral Q breakfast  . gabapentin  200 mg Oral Q lunch  . HYDROmorphone   Intravenous Q4H  . lanthanum  1,000 mg Oral TID WC  . lidocaine  1 patch Transdermal Q24H  . sertraline  100 mg Oral Daily   Continuous Infusions:   LOS: 0 days    [name] Oestreicher Jaynie Collins, MD [facility] Pager [phone]  If 7PM-7AM, please contact  night-coverage [email or web address] Password [facility] 12/12/2016, 11:38 AM

## 2016-12-12 NOTE — Progress Notes (Signed)
Order from Palliative to turn off ICD, [name] Small from [employer] came to turn off ICD at [employer]. Phone number [phone]

## 2016-12-12 NOTE — Consult Note (Signed)
Consultation Note Date: 12/12/2016   Patient Name: [name] Small  DOB: Oct 03, 1991  MRN: [id number]  Age / Sex: 26 y.o., female  PCP: Quentin Angst, MD Referring Physician: Maxie Barb, MD  Reason for Consultation: Establishing goals of care, Pain control, Terminal Care and Withdrawal of life-sustaining treatment  HPI/Patient Profile: 26 y.o. female  with past medical history of polycystic kidney disease (ESRD on HD), calciphylaxis, depression, compression fractures, cardiomyopathy, CHF (s/p AICD), admitted on 12/11/2016 with uncontrolled pain d/t calciphylaxis. Palliative medicine consulted for GOC and withdrawal of HD.   Clinical Assessment and Goals of Care: Patient is well known to palliative service. She is in extreme pain d/t calciphylaxis. Observed to be tense, moving legs in agitation and pain. She becomes tearful and states "I don't want to keep living like this". She desires to discontinue HD and begin full comfort measures. Her parents are present and they fully support her decision. This option has been discussed with her and her family in the past with palliative medicine. They request residential hospice facility if possible.   Primary Decision Maker PATIENT    SUMMARY OF RECOMMENDATIONS -Transition to full comfort care -Referral for residential hospice for symptom management - calciphylaxis pain is very difficult to manage -Increase dilaudid PCA to increase comfort- initiate continuous infusion of 1.5mg  with 2mg  bolus -lorazepam 1mg  IV for anxiety and pain -diphenhydramine 25mg  IV q4hr pain, anxiety -robinul .2 mgIV or 1mg  po for secretions -Chaplain consult  -D/C telemetry, D/C pulse ox  Code Status/Advance Care Planning:  DNR   Palliative Prophylaxis:   Frequent Pain Assessment  Additional Recommendations (Limitations, Scope, Preferences):  Full Comfort  Care  Psycho-social/Spiritual:   Desire for further Chaplaincy support:yes  Additional Recommendations: Education on Hospice  Prognosis:    < 2 weeks  Discharge Planning: Hospice facility  Primary Diagnoses: Present on Admission: . Intractable pain . 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 Essential hypertension . Chronic systolic heart failure (HCC) . 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 The following aspects are pertinent.  Past Medical History:  Diagnosis Date  . 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 Anemia   . Anxiety   . Cardiomyopathy   . Cellulitis and abscess of face 03/22/2013  . CHF (congestive heart failure) (HCC)   . Chronic anticoagulation   . Chronic pain   . Depression   . DVT (deep venous thrombosis) (HCC) ~ 2014   BLE  . Dysrhythmia    at times per pt.  . 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  Marland Kitchen ESRD (end stage renal disease) on dialysis [facility])    "MWF; [facility] " (09/2016)  . Fracture, thoracic vertebra (HCC) 07/2016   "T7-T12"  . GERD (gastroesophageal reflux disease)   . H/O  transfusion of packed red blood cells   . Heart murmur   . Hypertension   . Narcotic abuse, continuous   . Osteoporosis   . Pelvic fracture (HCC) 06/2015   "on the right"  . Pulmonary emboli (HCC) 01/2012   Bilateral, moderate clot burden, areas of pulmonary infarction and central necrosis  . Sickle cell anemia (HCC)    Social History   Social History  . Marital status: Single    Spouse name: N/A  . Number of children: N/A  . Years of education: N/A   Social History Main Topics  . Smoking status: Former Smoker    Packs/day: 0.00    Years:  1.00    Types: Cigarettes    Quit date: 12/08/2015  . Smokeless tobacco: Never Used  . Alcohol use No  . Drug use: No  . Sexual activity: Not Currently    Birth control/ protection: None   Other Topics Concern  . None   Social History Narrative   Was smoking 3 cigs per day. Lives at home with roommates, has some family locally.    Family History  Problem Relation Age of Onset  . Polycystic kidney disease Father   . Hypertension Father    Scheduled Meds: . gabapentin  100 mg Oral Q breakfast  . gabapentin  200 mg Oral Q lunch  . HYDROmorphone   Intravenous Q4H  . lidocaine  1 patch Transdermal Q24H  . polyethylene glycol  17 g Oral Daily  . sertraline  100 mg Oral Daily   Continuous Infusions: PRN Meds:.acetaminophen **OR** acetaminophen, antiseptic oral rinse, camphor-menthol, diphenhydrAMINE, famotidine, glycopyrrolate **OR** glycopyrrolate **OR** glycopyrrolate, haloperidol **OR** haloperidol **OR** haloperidol lactate, LORazepam **OR** [DISCONTINUED] LORazepam **OR** LORazepam, methocarbamol, NON FORMULARY 1 g, ondansetron **OR** ondansetron (ZOFRAN) IV, polyvinyl alcohol Medications Prior to Admission:  Prior to Admission medications   Medication Sig Start Date End Date Taking? Authorizing Provider  calcitonin, salmon, (MIACALCIN) 200 UNIT/ACT nasal spray Place 1 spray into alternate nostrils daily. 09/01/16  Yes Daniel J Angiulli, PA-C  carvedilol (COREG) 3.125 MG tablet Take 1 tablet (3.125 mg total) by mouth 2 (two) times daily. 10/12/16  Yes Dorothea Ogle, MD  cinacalcet (SENSIPAR) 30 MG tablet Take 3 tablets (90 mg total) by mouth daily with supper. 10/12/16  Yes Dorothea Ogle, MD  colchicine 0.6 MG tablet Take 1 tablet (0.6 mg total) by mouth daily. 10/12/16  Yes Alison Murray, MD  collagenase Melburn Popper) ointment Apply Santyl to right groin and leg wounds daily, then cover with moist 2X2 and foam dressings 10/28/16  Yes Jennifer Chahn-Yang Choi, DO  famotidine (PEPCID) 20  MG tablet Take 1 tablet (20 mg total) by mouth 2 (two) times daily as needed for heartburn or indigestion. 09/06/16  Yes Ankit Karis Juba, MD  fentaNYL (DURAGESIC - DOSED MCG/HR) 100 MCG/HR Place 1 patch (100 mcg total) onto the skin every 3 (three) days. 10/11/16  Yes Marianne L York, PA-C  gabapentin (NEURONTIN) 100 MG capsule Take 3 capsules (300 mg total) by mouth at bedtime. Patient taking differently: Take 100-200 mg by mouth See admin instructions. 100mg  at breakfast and 200mg  at lunch 09/27/16  Yes Zannie Cove, MD  ibuprofen (ADVIL,MOTRIN) 200 MG tablet Take 1 tablet (200 mg total) by mouth 3 (three) times daily. Patient taking differently: Take 200 mg by mouth 3 (three) times daily as needed for headache or moderate pain.  10/12/16  Yes Alison Murray, MD  lanthanum (FOSRENOL) 1000 MG chewable tablet Chew 1  tablet (1,000 mg total) by mouth 3 (three) times daily with meals. 09/01/16  Yes Daniel J Angiulli, PA-C  lidocaine (LIDODERM) 5 % Place 1 patch onto the skin daily. Remove & Discard patch within 12 hours or as directed by MD 10/12/16  Yes Alison Murray, MD  lidocaine (XYLOCAINE) 2 % jelly To leg wounds during dressing change to decrease pain, use daily as needed 10/28/16  Yes Jennifer Chahn-Yang Choi, DO  LORazepam (ATIVAN) 1 MG tablet Take 1 tablet (1 mg total) by mouth at bedtime. Patient taking differently: Take 1 mg by mouth daily.  09/05/16  Yes Quentin Angst, MD  methocarbamol (ROBAXIN) 500 MG tablet Take 1 tablet (500 mg total) by mouth every 6 (six) hours as needed for muscle spasms. 09/01/16  Yes Daniel J Angiulli, PA-C  multivitamin (RENA-VIT) TABS tablet Take 1 tablet by mouth daily. 09/01/16  Yes Daniel J Angiulli, PA-C  sertraline (ZOLOFT) 100 MG tablet Take 1 tablet (100 mg total) by mouth daily. 09/01/16  Yes Daniel J Angiulli, PA-C   Allergies  Allergen Reactions  . Aspirin Other (See Comments)    Interacts with Coreg  . Tramadol Anaphylaxis  . Vicodin  [Hydrocodone-Acetaminophen] Hives  . Buprenorphine Hcl Itching and Other (See Comments)    Ok with oxycodone  . Iohexol Itching  . Morphine And Related Itching and Other (See Comments)    Ok with oxycodone    Review of Systems  Constitutional: Positive for activity change and fatigue.  HENT: Negative.   Respiratory: Negative.   Cardiovascular: Negative.   Gastrointestinal: Negative.   Endocrine: Negative.   Neurological: Negative.   Psychiatric/Behavioral: The patient is nervous/anxious.     Physical Exam  Constitutional: She is oriented to person, place, and time. She appears distressed.  cachetic  Cardiovascular: Normal rate and regular rhythm.   Pulmonary/Chest: Effort normal. No respiratory distress.  Neurological: She is alert and oriented to person, place, and time.  Skin:  Severe calciphylaxis wounds on lower extremities   Psychiatric: She has a normal mood and affect. Her behavior is normal. Judgment and thought content normal.  Nursing note and vitals reviewed.   Vital Signs: BP (!) 135/105 (BP Location: Right Arm)   Pulse (!) 51   Temp 98.2 F (36.8 C) (Oral)   Resp 15   Ht 5\' 3"  (1.6 m)   Wt 47.9 kg (105 lb 9.6 oz)   SpO2 93%   BMI 18.71 kg/m  Pain Assessment: 0-10 POSS *See Group Information*: 1-Acceptable,Awake and alert Pain Score: 7    SpO2: SpO2: 93 % O2 Device:SpO2: 93 % O2 Flow Rate: .   IO: Intake/output summary:   Intake/Output Summary (Last 24 hours) at 12/12/16 1431 Last data filed at 12/12/16 1610  Gross per 24 hour  Intake            122.2 ml  Output                0 ml  Net            122.2 ml    LBM: Last BM Date: 12/11/16 Baseline Weight: Weight: 47.9 kg (105 lb 9.6 oz) Most recent weight: Weight: 47.9 kg (105 lb 9.6 oz)     Palliative Assessment/Data: PPS: 20%   Flowsheet Rows   Flowsheet Row Most Recent Value  Intake Tab  Referral Department  Hospitalist  Unit at Time of Referral  Med/Surg Unit  Palliative Care  Primary Diagnosis  Cardiac  Date Notified  12/12/16  Palliative Care Type  Return patient Palliative Care  Reason for referral  Clarify Goals of Care, Pain  Date of Admission  12/11/16  Date first seen by Palliative Care  12/12/16  # of days Palliative referral response time  0 Day(s)  # of days IP prior to Palliative referral  1  Clinical Assessment  Palliative Performance Scale Score  20%  Psychosocial & Spiritual Assessment  Social Work Plan of Care  Clarified patient/family wishes with healthcare team, Participated in Family meeting, Advance care planning  Palliative Care Outcomes  Patient/Family meeting held?  Yes  Who was at the meeting?  Patient and parents  Palliative Care Outcomes  Improved pain interventions, Clarified goals of care, Provided end of life care assistance, Transitioned to hospice, Provided advance care planning, Changed to focus on comfort  Patient/Family wishes: Interventions discontinued/not started   Other (Comment) [no life prolonging measures]  Palliative Care follow-up planned  Yes, Facility      Thank you for this consult. Palliative medicine will continue to follow and assist as needed.    Time Total: 75 minutes Greater than 50%  of this time was spent counseling and coordinating care related to the above assessment and plan.  Signed by: Ocie Bob, AGNP-C Palliative Medicine    Please contact Palliative Medicine Team phone at [phone] for questions and concerns.  For individual provider: See Loretha Stapler

## 2016-12-12 NOTE — Progress Notes (Signed)
Patient itching arms and legs,requests for benadryl. MD on call text paged. Order received.Will continue to monitor. Briannie Gutierrez, Drinda Butts, Charity fundraiser

## 2016-12-12 NOTE — Clinical Social Work Note (Addendum)
CSW received consult for residential hospice. Chart reviewed and a Palliative Care Consult had been requested. CSW talked with patient's mother, Trasha Rubert at the bedside regarding hospice preference (patient was asleep). Mrs. Vonwald indicated that she and her husband live in [place] and their hospice choice is [facility]. When informed of the need for a second choice (and why needed),  Mrs. Exantus indicated that she could not make that decision at this time and would talk with her husband. CSW assured mother that she would be kept informed of progress with hospice placement.  Call made to [facility] (4:45 pm), nurse liaison with [facility] and referral made. Lafonda Mosses was provided with name and contact information for patient's mother.   Genelle Bal, MSW, LCSW Licensed Clinical Social Worker Clinical Social Work Department [employer] [phone]

## 2016-12-12 NOTE — ED Notes (Signed)
Attempted report x1. 

## 2016-12-12 NOTE — Progress Notes (Signed)
New Admission Note:   Arrival Method: Arrived from [facility] ED via stretcher Mental Orientation: Alert and oriented x4 Telemetry: N/A Assessment: Completed Skin: See doc flowsheet IV: L Wrist Pain: 10/10 Tubes: N/A Safety Measures: Safety Fall Prevention Plan has been given, discussed. Admission: Completed [facility] [place] Orientation: Patient has been orientated to the room, unit and staff.  Family: Parents at bedside.  Orders have been reviewed and implemented. Will continue to monitor the patient. Call light has been placed within reach and bed alarm has been activated.   [employer], RN-BC Phone number: [phone]

## 2016-12-13 DIAGNOSIS — Z992 Dependence on renal dialysis: Secondary | ICD-10-CM

## 2016-12-13 DIAGNOSIS — N186 End stage renal disease: Secondary | ICD-10-CM

## 2016-12-13 DIAGNOSIS — I5022 Chronic systolic (congestive) heart failure: Secondary | ICD-10-CM

## 2016-12-13 DIAGNOSIS — I10 Essential (primary) hypertension: Secondary | ICD-10-CM

## 2016-12-13 MED ORDER — LORAZEPAM 1 MG PO TABS: 1.0000 mg | ORAL_TABLET | Freq: Every day | ORAL | 1 refills | Status: AC

## 2016-12-13 MED ORDER — HYDROMORPHONE HCL 1 MG/ML IJ SOLN
4.0000 mg | Freq: Once | INTRAMUSCULAR | Status: AC
  Administered 2016-12-13: 4 mg via INTRAVENOUS
  Filled 2016-12-13: qty 4

## 2016-12-13 MED ORDER — BIOTENE DRY MOUTH MT LIQD: 15.0000 mL | OROMUCOSAL | Status: AC | PRN

## 2016-12-13 MED ORDER — GLYCOPYRROLATE 1 MG PO TABS: 1.0000 mg | ORAL_TABLET | ORAL | 0 refills | Status: AC | PRN

## 2016-12-13 MED ORDER — GABAPENTIN 100 MG PO CAPS: 100.0000 mg | ORAL_CAPSULE | ORAL | Status: AC

## 2016-12-13 MED ORDER — NONFORMULARY OR COMPOUNDED ITEM: 1.0000 g | Status: AC | PRN

## 2016-12-13 MED FILL — Medication: Qty: 1 | Status: AC

## 2016-12-13 NOTE — Progress Notes (Signed)
Report called to Almira Coaster, Charity fundraiser at [facility]

## 2016-12-13 NOTE — Discharge Summary (Signed)
Physician Discharge Summary  [name] Small  UJW:[id number]  DOB: 02/26/1991  DOA: 12/11/2016 PCP: Jeanann Lewandowsky, MD  Admit date: 12/11/2016 Discharge date: 12/13/2016  Admitted From: Home  Disposition:  Hospice   Discharge Condition: Fair - poor prognosis  CODE STATUS:DNR  Diet recommendation: Low sodium, fluid restriction  Brief/Interim Summary: [name] Small is a 26 y.o. female with medical history significant of AICD in [place], cardiomyopathy, systolic CHF, anemia, anxiety, DVT, pulmonary embolism, depression, ESRD on hemodialysis, calciphylaxis, chronic nonhealing painful wounds, GERD, heart murmur, hypertension, narcotics abuse, osteoporosis who presented to the emergency department with complaints of severe pain on her multiple buttocks and lower extremities wounds. Patient was admitted for pain management and palliative care measures. Patient was started on Dilaudid PCA with improvement of her pain. Palliative care was consulted and recommended hospice. Patient decided to be DNR. Patient will be discharge to hospice facility for comfort measures.   Subjective: Patient seen and examined with family at bedside. Patient report pain is now tolerable. No other complaints at this time.   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  Patient declining further hemodialysis treatment    All other chronic medical condition were stable during the hospitalization. On the day of the discharge the patient's vitals were stable, and no other acute  medical condition were reported by patient. The patient has qualified for hospice care and will be discharge to hospice facility for further comfort management.   Discharge Instructions You were cared for by a hospitalist during your hospital stay. If you have any questions about your discharge medications or the care you received while you were in the hospital after you are discharged, you can call the unit and asked to speak with the hospitalist on call if the hospitalist that took care of you is not available. Once you are discharged, your primary care physician will handle any further medical issues. Please note that NO REFILLS for any discharge medications will be authorized once you are discharged, as it is imperative that you return to your primary care physician (or establish a relationship with a primary care physician if you do not have one) for your aftercare needs so that they can reassess your need for medications and monitor your lab values.  Discharge Instructions    Call MD for:  difficulty breathing, headache or visual disturbances    Complete by:  As directed    Call MD for:  extreme fatigue    Complete by:  As directed    Call MD for:  hives    Complete by:  As directed    Call MD for:  persistant dizziness or light-headedness    Complete by:  As directed    Call MD for:  persistant nausea and vomiting    Complete by:  As directed    Call MD for:  redness, tenderness, or signs of infection (pain, swelling, redness, odor or green/yellow discharge around incision site)    Complete by:  As directed    Call MD for:  severe uncontrolled pain    Complete by:  As directed    Call MD  for:  temperature >100.4    Complete by:  As directed    Diet - low sodium heart healthy    Complete by:  As directed    Increase activity slowly    Complete by:  As directed      Allergies as of 12/13/2016      Reactions   Aspirin Other (See Comments)   Interacts with Coreg   Tramadol  Anaphylaxis   Vicodin [hydrocodone-acetaminophen] Hives   Buprenorphine Hcl Itching, Other (See Comments)   Ok with oxycodone   Iohexol Itching   Morphine And Related Itching, Other (See Comments)   Ok with oxycodone      Medication List    STOP taking these medications   ibuprofen 200 MG tablet Commonly known as:  ADVIL,MOTRIN     TAKE these medications   antiseptic oral rinse Liqd Apply 15 mLs topically as needed for dry mouth.   calcitonin (salmon) 200 UNIT/ACT nasal spray Commonly known as:  MIACALCIN Place 1 spray into alternate nostrils daily.   carvedilol 3.125 MG tablet Commonly known as:  COREG Take 1 tablet (3.125 mg total) by mouth 2 (two) times daily.   cinacalcet 30 MG tablet Commonly known as:  SENSIPAR Take 3 tablets (90 mg total) by mouth daily with supper.   colchicine 0.6 MG tablet Take 1 tablet (0.6 mg total) by mouth daily.   collagenase ointment Commonly known as:  SANTYL Apply Santyl to right groin and leg wounds daily, then cover with moist 2X2 and foam dressings   famotidine 20 MG tablet Commonly known as:  PEPCID Take 1 tablet (20 mg total) by mouth 2 (two) times daily as needed for heartburn or indigestion.   fentaNYL 100 MCG/HR Commonly known as:  DURAGESIC - dosed mcg/hr Place 1 patch (100 mcg total) onto the skin every 3 (three) days.   gabapentin 100 MG capsule Commonly known as:  NEURONTIN Take 1-2 capsules (100-200 mg total) by mouth See admin instructions. 100mg  at breakfast and 200mg  at lunch What changed:  how much to take  when to take this  additional instructions   glycopyrrolate 1 MG tablet Commonly known as:  ROBINUL Take 1 tablet (1 mg total) by mouth every 4 (four) hours as needed (excessive secretions).   lanthanum 1000 MG chewable tablet Commonly known as:  FOSRENOL Chew 1 tablet (1,000 mg total) by mouth 3 (three) times daily with meals.   lidocaine 2 % jelly Commonly known as:  XYLOCAINE To leg wounds  during dressing change to decrease pain, use daily as needed   lidocaine 5 % Commonly known as:  LIDODERM Place 1 patch onto the skin daily. Remove & Discard patch within 12 hours or as directed by MD   LORazepam 1 MG tablet Commonly known as:  ATIVAN Take 1 tablet (1 mg total) by mouth at bedtime. What changed:  when to take this   methocarbamol 500 MG tablet Commonly known as:  ROBAXIN Take 1 tablet (500 mg total) by mouth every 6 (six) hours as needed for muscle spasms.   multivitamin Tabs tablet Take 1 tablet by mouth daily.   NONFORMULARY OR COMPOUNDED ITEM Apply 1 g topically every 4 (four) hours as needed (painful lesions).   sertraline 100 MG tablet Commonly known as:  ZOLOFT Take 1 tablet (100 mg total) by mouth daily.       Allergies  Allergen Reactions  . Aspirin Other (See Comments)    Interacts with Coreg  . Tramadol Anaphylaxis  .  Vicodin [Hydrocodone-Acetaminophen] Hives  . Buprenorphine Hcl Itching and Other (See Comments)    Ok with oxycodone  . Iohexol Itching  . Morphine And Related Itching and Other (See Comments)    Ok with oxycodone     Consultations:  Palliative Care    Discharge Exam: Vitals:   12/13/16 0747 12/13/16 0925  BP:  (!) 123/95  Pulse:  (!) 101  Resp: 18 19  Temp:  98.8 F (37.1 C)   Vitals:   12/13/16 0122 12/13/16 0449 12/13/16 0747 12/13/16 0925  BP:    (!) 123/95  Pulse:    (!) 101  Resp: 16 16 18 19   Temp:    98.8 F (37.1 C)  TempSrc:    Oral  SpO2:    90%  Weight:      Height:        General: Pt is alert and awake  Cardiovascular: RRR, S1/S2 +, no rubs, no gallops Respiratory: CTA b/L  Abdominal: Soft, NT, ND, bowel sounds + Extremities: 2+ LE edema, severe calciphylaxis wound on lower extremities    The results of significant diagnostics from this hospitalization (including imaging, microbiology, ancillary and laboratory) are listed below for reference.     Labs: BNP (last 3 results)  Recent  Labs  04/11/16 1130 07/26/16 1803  BNP >4,500.0* >4,500.0*    Urinalysis    Component Value Date/Time   COLORURINE YELLOW 01/14/2013 1639   APPEARANCEUR CLOUDY (A) 01/14/2013 1639   LABSPEC 1.008 01/14/2013 1639   PHURINE 8.0 01/14/2013 1639   GLUCOSEU NEGATIVE 01/14/2013 1639   HGBUR SMALL (A) 01/14/2013 1639   BILIRUBINUR NEGATIVE 01/14/2013 1639   KETONESUR NEGATIVE 01/14/2013 1639   PROTEINUR 30 (A) 01/14/2013 1639   UROBILINOGEN 0.2 01/14/2013 1639   NITRITE NEGATIVE 01/14/2013 1639   LEUKOCYTESUR MODERATE (A) 01/14/2013 1639    Time coordinating discharge: Over 42 minutes  SIGNED:  Latrelle Dodrill, MD  [facility] 12/13/2016, 11:16 AM  Pager please text page via  [email or web address] Password [email or web address]

## 2016-12-13 NOTE — Progress Notes (Signed)
Pt. Will be transferred to Hospice in [facility]. Will be a 30-45 min. Ride via transport. Edward Jolly, MD notified requesting bolus dose of dilaudid. Order placed. Will continue to monitor.

## 2016-12-13 NOTE — Progress Notes (Signed)
Dilaudid PCA syringe changed. 12 cc (12 mg) wasted with Selena Batten, Rn.

## 2016-12-13 NOTE — Progress Notes (Signed)
Dilaudid PCA syringe D/C'd. 25cc wasted with Selena Batten, Rn.

## 2016-12-13 NOTE — Progress Notes (Signed)
Dilaudid PCA syringe changed. 0 cc wasted.

## 2016-12-13 NOTE — Progress Notes (Signed)
[name] to be D/C'd to [facility] of [facility] per MD order.  Discussed prescriptions and follow up appointments with the patient. Prescriptions given to patient, medication list explained in detail. Pt verbalized understanding.  Allergies as of 12/13/2016      Reactions   Aspirin Other (See Comments)   Interacts with Coreg   Tramadol Anaphylaxis   Vicodin [hydrocodone-acetaminophen] Hives   Buprenorphine Hcl Itching, Other (See Comments)   Ok with oxycodone   Iohexol Itching   Morphine And Related Itching, Other (See Comments)   Ok with oxycodone      Medication List    STOP taking these medications   ibuprofen 200 MG tablet Commonly known as:  ADVIL,MOTRIN     TAKE these medications   antiseptic oral rinse Liqd Apply 15 mLs topically as needed for dry mouth.   calcitonin (salmon) 200 UNIT/ACT nasal spray Commonly known as:  MIACALCIN Place 1 spray into alternate nostrils daily.   carvedilol 3.125 MG tablet Commonly known as:  COREG Take 1 tablet (3.125 mg total) by mouth 2 (two) times daily.   cinacalcet 30 MG tablet Commonly known as:  SENSIPAR Take 3 tablets (90 mg total) by mouth daily with supper.   colchicine 0.6 MG tablet Take 1 tablet (0.6 mg total) by mouth daily.   collagenase ointment Commonly known as:  SANTYL Apply Santyl to right groin and leg wounds daily, then cover with moist 2X2 and foam dressings   famotidine 20 MG tablet Commonly known as:  PEPCID Take 1 tablet (20 mg total) by mouth 2 (two) times daily as needed for heartburn or indigestion.   fentaNYL 100 MCG/HR Commonly known as:  DURAGESIC - dosed mcg/hr Place 1 patch (100 mcg total) onto the skin every 3 (three) days.   gabapentin 100 MG capsule Commonly known as:  NEURONTIN Take 1-2 capsules (100-200 mg total) by mouth See admin instructions. 100mg  at breakfast and 200mg  at lunch What changed:  how much to take  when to take this  additional instructions   glycopyrrolate  1 MG tablet Commonly known as:  ROBINUL Take 1 tablet (1 mg total) by mouth every 4 (four) hours as needed (excessive secretions).   lanthanum 1000 MG chewable tablet Commonly known as:  FOSRENOL Chew 1 tablet (1,000 mg total) by mouth 3 (three) times daily with meals.   lidocaine 2 % jelly Commonly known as:  XYLOCAINE To leg wounds during dressing change to decrease pain, use daily as needed   lidocaine 5 % Commonly known as:  LIDODERM Place 1 patch onto the skin daily. Remove & Discard patch within 12 hours or as directed by MD   LORazepam 1 MG tablet Commonly known as:  ATIVAN Take 1 tablet (1 mg total) by mouth at bedtime. What changed:  when to take this   methocarbamol 500 MG tablet Commonly known as:  ROBAXIN Take 1 tablet (500 mg total) by mouth every 6 (six) hours as needed for muscle spasms.   multivitamin Tabs tablet Take 1 tablet by mouth daily.   NONFORMULARY OR COMPOUNDED ITEM Apply 1 g topically every 4 (four) hours as needed (painful lesions).   sertraline 100 MG tablet Commonly known as:  ZOLOFT Take 1 tablet (100 mg total) by mouth daily.       Vitals:   12/13/16 0925 12/13/16 1132  BP: (!) 123/95   Pulse: (!) 101   Resp: 19 16  Temp: 98.8 F (37.1 C)     Skin clean,  dry and intact without evidence of skin break down, no evidence of skin tears noted. IV catheter left in place. Site without signs and symptoms of complications. Pt denies pain at this time. No complaints noted.  Patient escorted via stretcher, and D/C to [facility] via transport.  Britt Bolognese RN, BSN

## 2016-12-13 NOTE — Clinical Social Work Note (Signed)
Patient discharging today to [facility] via ambulance. Paperwork completed by Lafonda Mosses, nurse liaison with patient's parents. Patient was also involved in dialogue with Hospice liaison.    Genelle Bal, MSW, LCSW Licensed Clinical Social Worker Clinical Social Work Department [employer] [phone]

## 2016-12-13 NOTE — Progress Notes (Signed)
   12/13/16 1000  Clinical Encounter Type  Visited With Patient and family together;Health care provider  Visit Type Spiritual support;Critical Care  Referral From Chaplain;Palliative care team  Consult/Referral To Chaplain  Spiritual Encounters  Spiritual Needs Prayer;Emotional  Stress Factors  Patient Stress Factors Exhausted;Family relationships;Health changes  Family Stress Factors Exhausted;Family relationships;Lack of knowledge;Loss    Followed up on [facility] consult from palliative care. Family would like to do some meaning making at end of life, chaplain provided ministry of presence and emotional support. Family is at bedside and meeting with hospice.

## 2016-12-21 DEATH — deceased

## 2017-03-30 IMAGING — DX DG CHEST 2V
2 series · 2 of 2 positions shown · non-contrast
Comparison: 12/21/2015 and previous

CLINICAL DATA: Tight squeezing, midsternal CP began this morning;
feels worse upon a deep breath.CHF; Pt has had defibrillator for one
year; HTN meds

EXAM:
CHEST - 2 VIEW

[w chest pa]
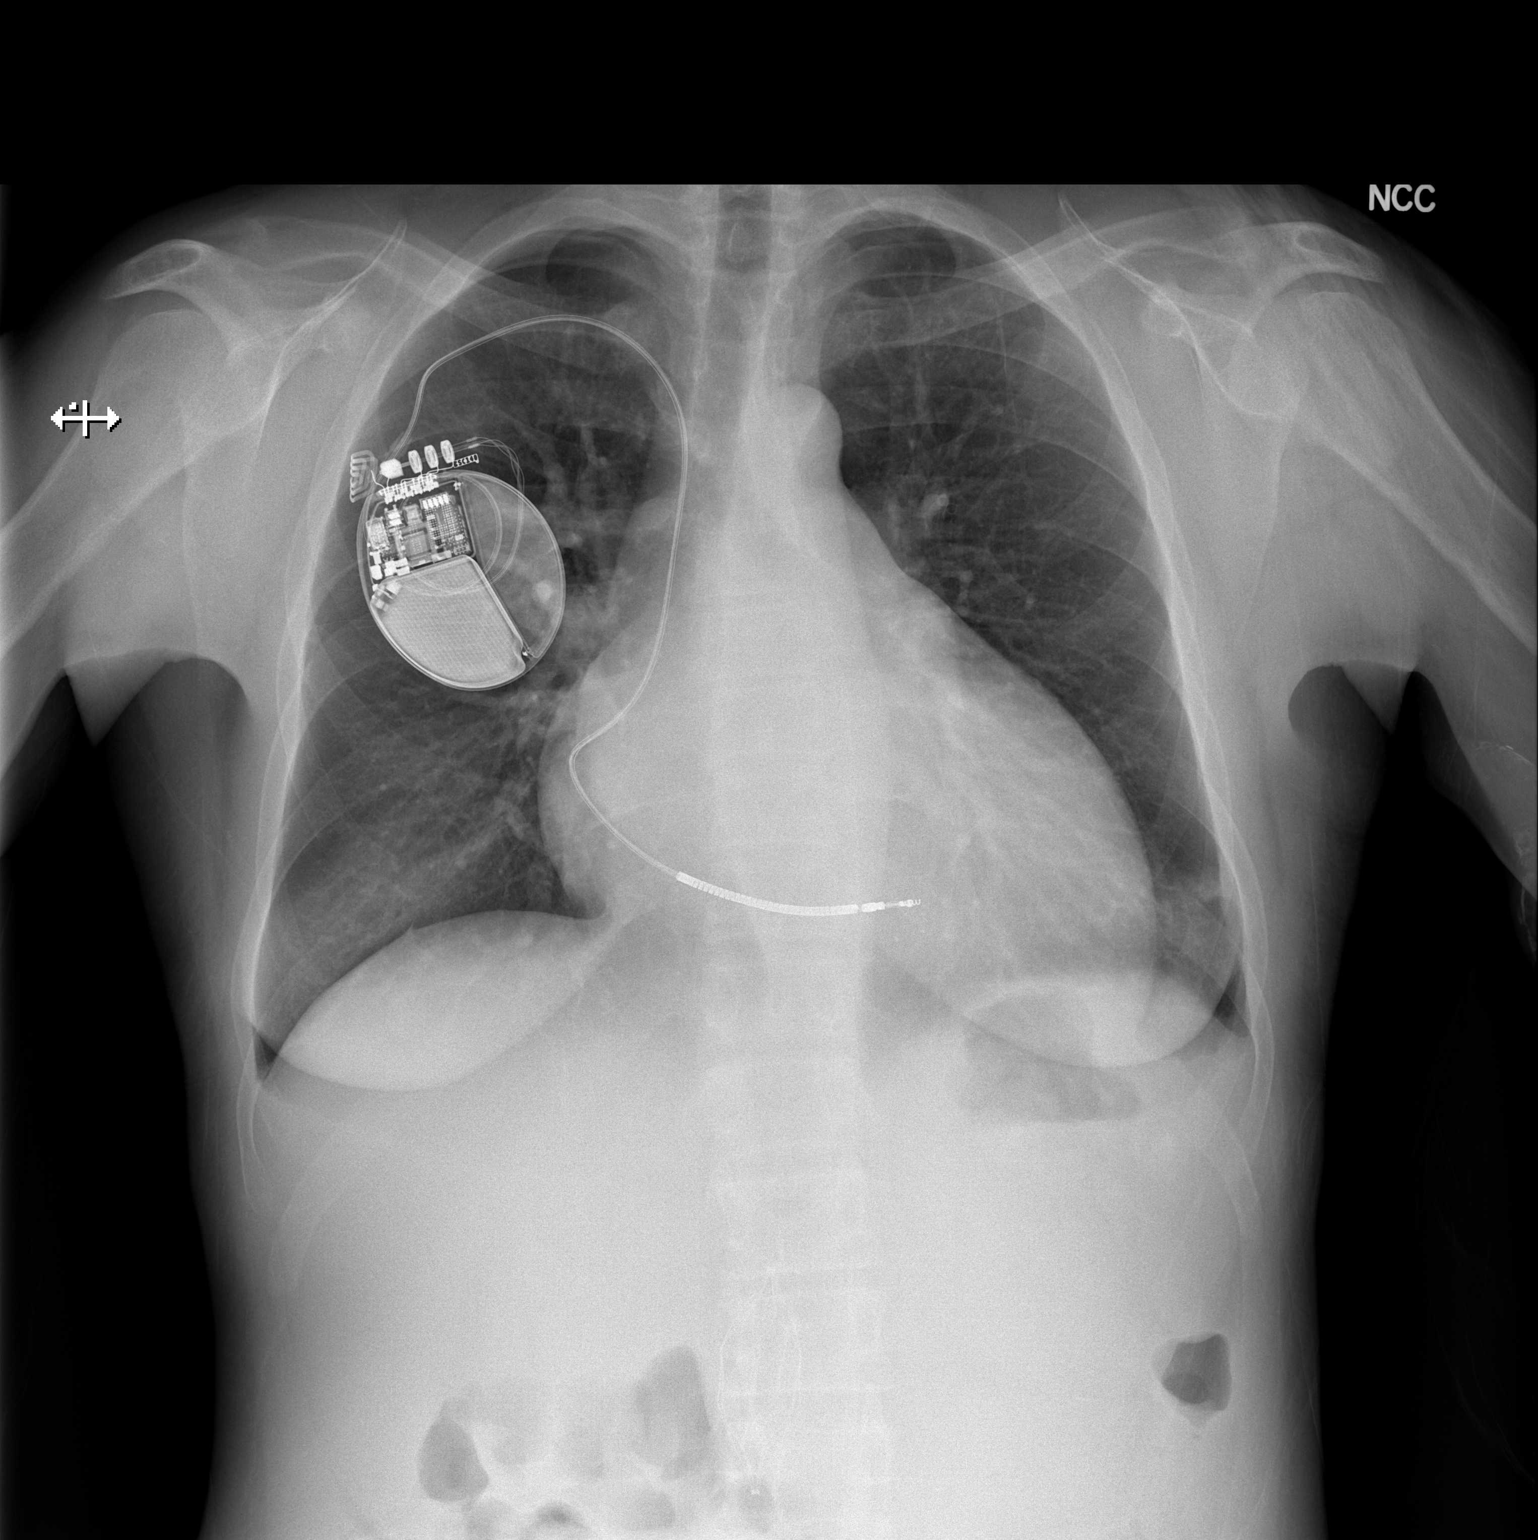

[w chest lat]
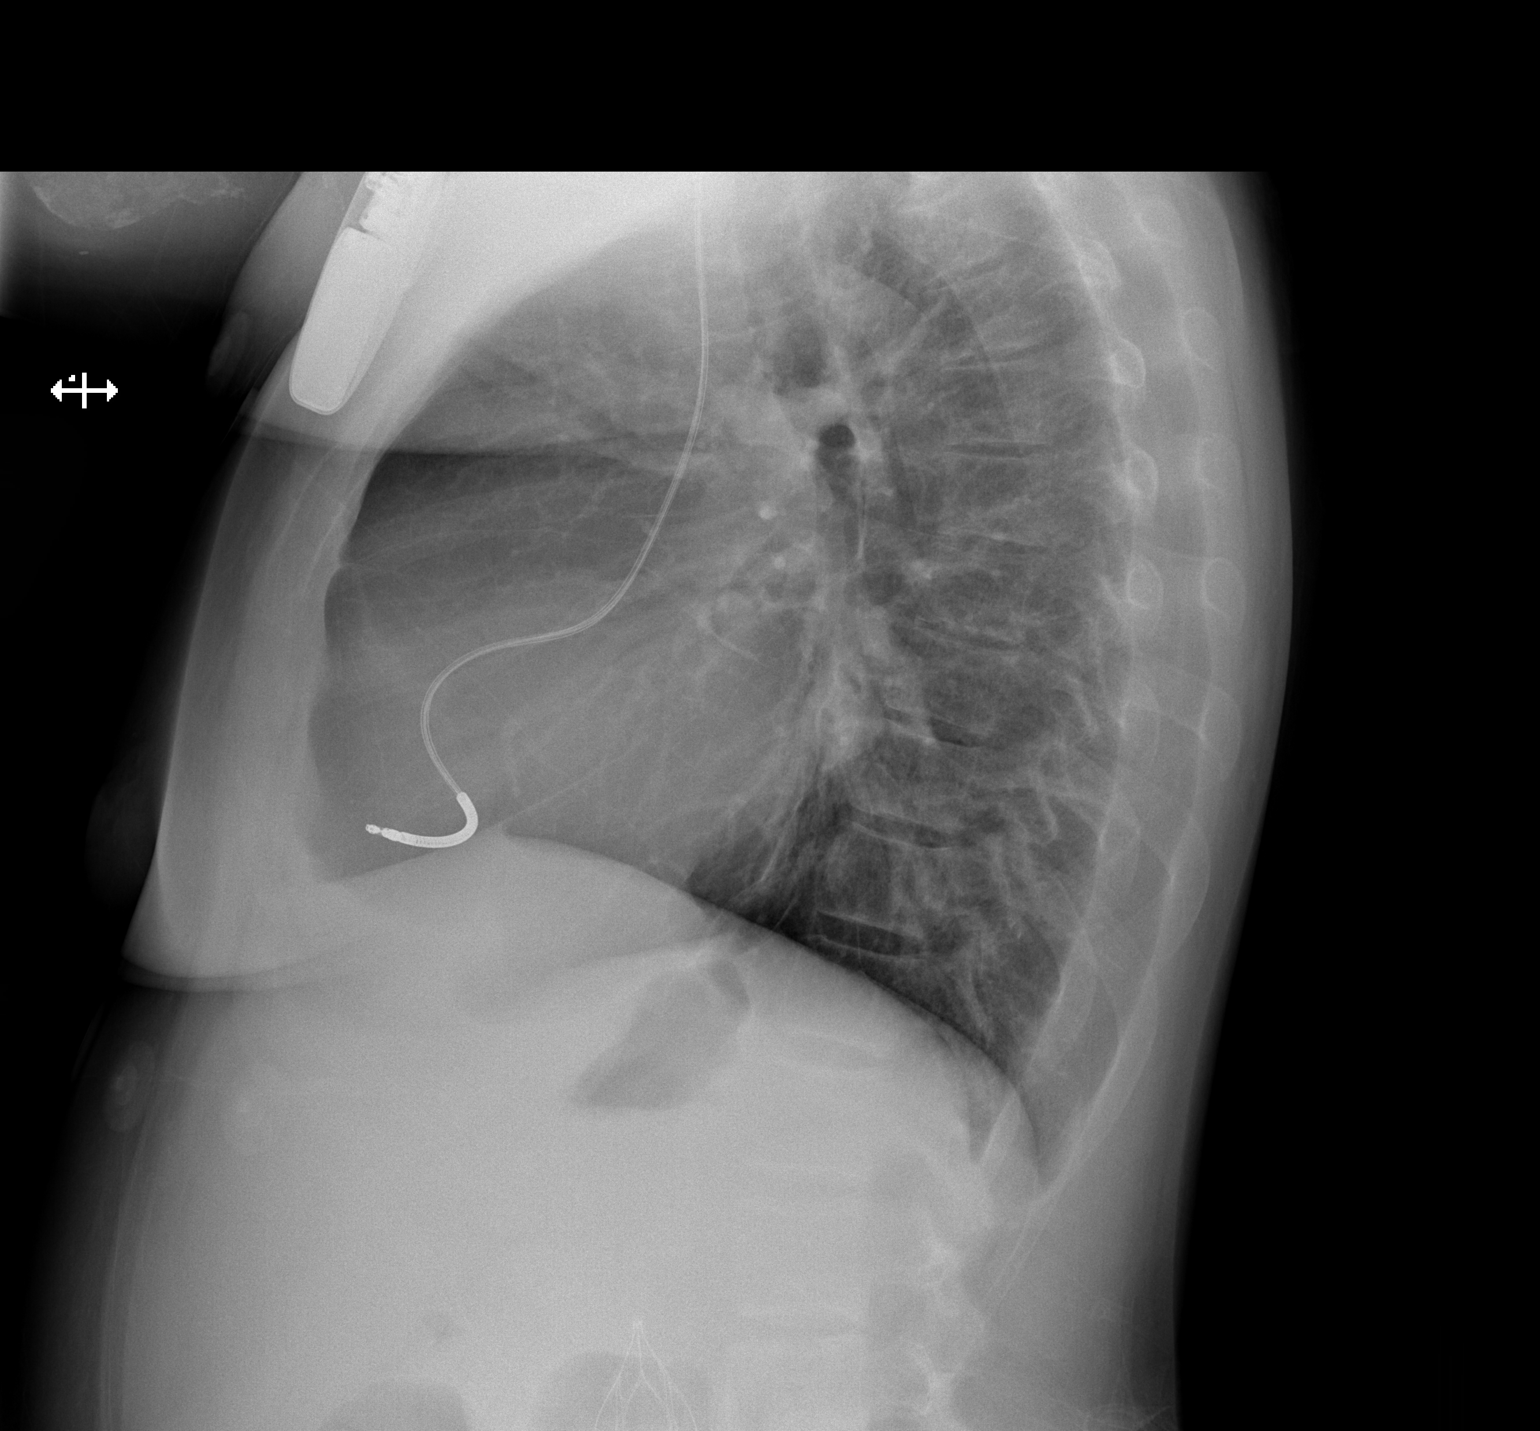

[2 of 2 positions shown; findings below may reference images not displayed]

FINDINGS: Stable position of right subclavian AICD. Stable cardiomegaly.
Subsegmental atelectasis or scarring laterally in the lingula. Lungs
otherwise clear. No pneumothorax.
No effusion.
Visualized skeletal structures are unremarkable. IVC filter at the
L3 level.
IMPRESSION: 1. Stable cardiomegaly and postop changes.  No acute disease.

## 2017-04-01 IMAGING — CR DG PELVIS 1-2V
1 series · 1 of 1 positions shown · non-contrast
Comparison: CT abdomen pelvis 10/26/2015

CLINICAL DATA: History of pelvic fracture. Pelvic pain.
Hemodialysis.

EXAM:
PELVIS - 1-2 VIEW

[t pelvis a.p.]
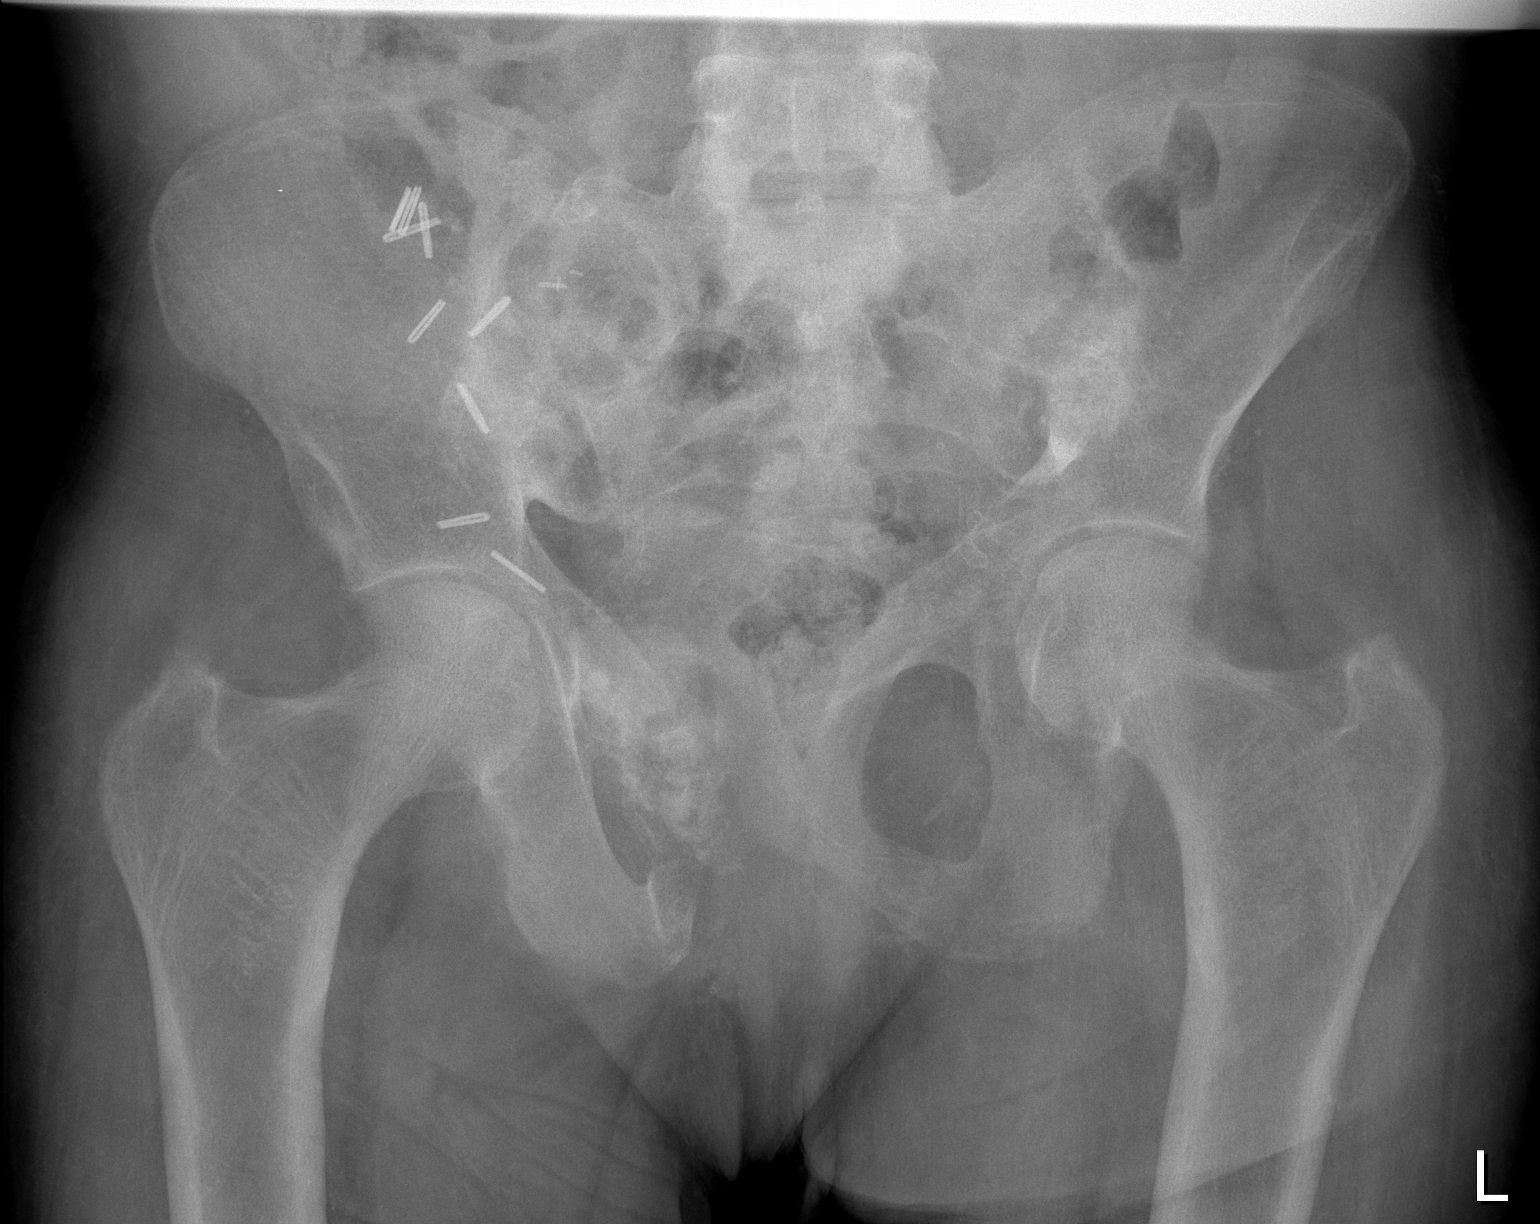

[1 of 1 positions shown; findings below may reference images not displayed]

FINDINGS: Erosive changes of the SI joint bilaterally. No fusion of the SI
joint. Fragmentation of the right pubis. This may be due to surgery
with bone grafting versus chronic fracture with heterotopic bone
formation.

Negative for acute fracture
IMPRESSION: Chronic resorptive changes of the SI joints bilaterally. Chronic
changes the right pubic bone stable from the prior CT. Probable
spondyloarthropathy of dialysis.

Negative for acute fracture

## 2017-04-07 IMAGING — NM NM PULMONARY VENT & PERF
16 series · 16 of 16 positions shown · non-contrast
Comparison: 04/14/2013 VQ study.  01/02/2016 chest radiograph

CLINICAL DATA: 24-year-old female with chest pain for 3 days.
Patient on dialysis and with sickle cell disease.

EXAM:
NUCLEAR MEDICINE VENTILATION - PERFUSION LUNG SCAN
TECHNIQUE: Ventilation images were obtained in multiple projections using
inhaled aerosol Rc-FFm DTPA. Perfusion images were obtained in
multiple projections after intravenous injection of Rc-FFm MAA.
RADIOPHARMACEUTICALS:  29.0 millicuries Technetium-BBm DTPA aerosol
inhalation and 4.0 millicuries of Technetium-BBm MAA IV

[Series 1: ant/post vent · 4.14mm/px · 1 of 1 slices shown (1 of 2)]
[im 1/1]
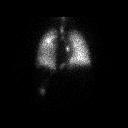

[Series 1: ant/post vent · 4.14mm/px · 1 of 1 slices shown (2 of 2)]
[im 1/1]
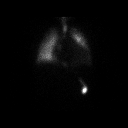

[Series 2: lao/rpo vent · 4.14mm/px · 1 of 1 slices shown (1 of 2)]
[im 1/1]
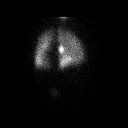

[Series 2: lao/rpo vent · 4.14mm/px · 1 of 1 slices shown (2 of 2)]
[im 1/1]
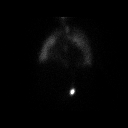

[Series 3: lpo/rao vent · 4.14mm/px · 1 of 1 slices shown (1 of 2)]
[im 1/1]
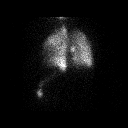

[Series 3: lpo/rao vent · 4.14mm/px · 1 of 1 slices shown (2 of 2)]
[im 1/1]
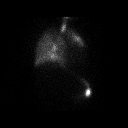

[Series 4: lt lat/rt lat vent · 4.14mm/px · 1 of 1 slices shown (1 of 2)]
[im 1/1]
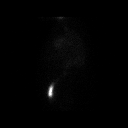

[Series 4: lt lat/rt lat vent · 4.14mm/px · 1 of 1 slices shown (2 of 2)]
[im 1/1]
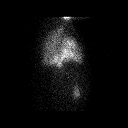

[Series 5: lt lat/rt lat perf · 4.14mm/px · 1 of 1 slices shown (1 of 2)]
[im 1/1]
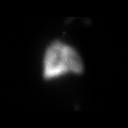

[Series 5: lt lat/rt lat perf · 4.14mm/px · 1 of 1 slices shown (2 of 2)]
[im 1/1]
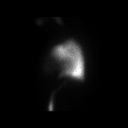

[Series 6: lpo/rao perf · 4.14mm/px · 1 of 1 slices shown (1 of 2)]
[im 1/1]
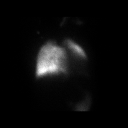

[Series 6: lpo/rao perf · 4.14mm/px · 1 of 1 slices shown (2 of 2)]
[im 1/1]
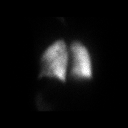

[Series 7: ant/post perf · 4.14mm/px · 1 of 1 slices shown (1 of 2)]
[im 1/1]
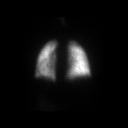

[Series 7: ant/post perf · 4.14mm/px · 1 of 1 slices shown (2 of 2)]
[im 1/1]
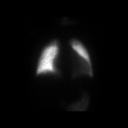

[Series 8: lao/rpo perf · 4.14mm/px · 1 of 1 slices shown (1 of 2)]
[im 1/1]
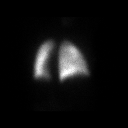

[Series 8: lao/rpo perf · 4.14mm/px · 1 of 1 slices shown (2 of 2)]
[im 1/1]
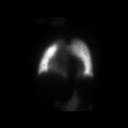

[16 of 16 positions shown; findings below may reference images not displayed]

FINDINGS: Ventilation: No focal ventilation defect.

Perfusion: No wedge shaped peripheral perfusion defects to suggest
acute pulmonary embolism.
IMPRESSION: No evidence of acute pulmonary emboli.  No changes from 5593.

## 2019-02-18 NOTE — Telephone Encounter (Signed)
Message sent to provider 

## 2019-02-21 NOTE — Telephone Encounter (Signed)
Message sent to provider
